# Patient Record
Sex: Female | Born: 1955 | Race: White | Hispanic: No | Smoking: Never smoker
Health system: Southern US, Community
[De-identification: ages and names within clinical notes are randomized; demographics above are authoritative.]

## PROBLEM LIST (undated history)

## (undated) DIAGNOSIS — K5190 Ulcerative colitis, unspecified, without complications: Secondary | ICD-10-CM

## (undated) DIAGNOSIS — E785 Hyperlipidemia, unspecified: Secondary | ICD-10-CM

## (undated) DIAGNOSIS — E119 Type 2 diabetes mellitus without complications: Secondary | ICD-10-CM

## (undated) DIAGNOSIS — R112 Nausea with vomiting, unspecified: Secondary | ICD-10-CM

## (undated) DIAGNOSIS — I10 Essential (primary) hypertension: Secondary | ICD-10-CM

## (undated) DIAGNOSIS — Z9289 Personal history of other medical treatment: Secondary | ICD-10-CM

## (undated) DIAGNOSIS — G2581 Restless legs syndrome: Secondary | ICD-10-CM

## (undated) DIAGNOSIS — G629 Polyneuropathy, unspecified: Secondary | ICD-10-CM

## (undated) DIAGNOSIS — J189 Pneumonia, unspecified organism: Secondary | ICD-10-CM

## (undated) DIAGNOSIS — K5792 Diverticulitis of intestine, part unspecified, without perforation or abscess without bleeding: Secondary | ICD-10-CM

## (undated) DIAGNOSIS — K219 Gastro-esophageal reflux disease without esophagitis: Secondary | ICD-10-CM

## (undated) DIAGNOSIS — G4730 Sleep apnea, unspecified: Secondary | ICD-10-CM

## (undated) DIAGNOSIS — F329 Major depressive disorder, single episode, unspecified: Secondary | ICD-10-CM

## (undated) DIAGNOSIS — J45909 Unspecified asthma, uncomplicated: Secondary | ICD-10-CM

## (undated) DIAGNOSIS — I5032 Chronic diastolic (congestive) heart failure: Secondary | ICD-10-CM

## (undated) DIAGNOSIS — I509 Heart failure, unspecified: Secondary | ICD-10-CM

## (undated) DIAGNOSIS — Z9889 Other specified postprocedural states: Secondary | ICD-10-CM

## (undated) DIAGNOSIS — T7840XA Allergy, unspecified, initial encounter: Secondary | ICD-10-CM

## (undated) DIAGNOSIS — I219 Acute myocardial infarction, unspecified: Secondary | ICD-10-CM

## (undated) DIAGNOSIS — M1990 Unspecified osteoarthritis, unspecified site: Secondary | ICD-10-CM

## (undated) DIAGNOSIS — N189 Chronic kidney disease, unspecified: Secondary | ICD-10-CM

## (undated) DIAGNOSIS — F419 Anxiety disorder, unspecified: Secondary | ICD-10-CM

## (undated) DIAGNOSIS — D649 Anemia, unspecified: Secondary | ICD-10-CM

## (undated) DIAGNOSIS — J449 Chronic obstructive pulmonary disease, unspecified: Secondary | ICD-10-CM

## (undated) DIAGNOSIS — R0902 Hypoxemia: Secondary | ICD-10-CM

## (undated) DIAGNOSIS — R0600 Dyspnea, unspecified: Secondary | ICD-10-CM

## (undated) DIAGNOSIS — F32A Depression, unspecified: Secondary | ICD-10-CM

## (undated) HISTORY — PX: HERNIA REPAIR: SHX51

## (undated) HISTORY — PX: BREAST SURGERY: SHX581

## (undated) HISTORY — DX: Major depressive disorder, single episode, unspecified: F32.9

## (undated) HISTORY — DX: Dyspnea, unspecified: R06.00

## (undated) HISTORY — DX: Restless legs syndrome: G25.81

## (undated) HISTORY — PX: COLON SURGERY: SHX602

## (undated) HISTORY — DX: Acute myocardial infarction, unspecified: I21.9

## (undated) HISTORY — PX: APPENDECTOMY: SHX54

## (undated) HISTORY — PX: TUBAL LIGATION: SHX77

## (undated) HISTORY — DX: Chronic diastolic (congestive) heart failure: I50.32

## (undated) HISTORY — DX: Depression, unspecified: F32.A

## (undated) HISTORY — PX: CARDIAC CATHETERIZATION: SHX172

## (undated) HISTORY — DX: Personal history of other medical treatment: Z92.89

## (undated) HISTORY — DX: Gastro-esophageal reflux disease without esophagitis: K21.9

## (undated) HISTORY — DX: Hypoxemia: R09.02

## (undated) HISTORY — DX: Ulcerative colitis, unspecified, without complications: K51.90

## (undated) HISTORY — PX: CHOLECYSTECTOMY: SHX55

## (undated) HISTORY — DX: Chronic obstructive pulmonary disease, unspecified: J44.9

## (undated) HISTORY — PX: ABDOMINAL HYSTERECTOMY: SHX81

## (undated) HISTORY — DX: Allergy, unspecified, initial encounter: T78.40XA

## (undated) HISTORY — DX: Essential (primary) hypertension: I10

## (undated) HISTORY — DX: Anxiety disorder, unspecified: F41.9

## (undated) HISTORY — DX: Hyperlipidemia, unspecified: E78.5

## (undated) HISTORY — DX: Unspecified asthma, uncomplicated: J45.909

## (undated) HISTORY — PX: BREAST BIOPSY: SHX20

---

## 1999-09-19 ENCOUNTER — Encounter (INDEPENDENT_AMBULATORY_CARE_PROVIDER_SITE_OTHER): Payer: Self-pay

## 1999-09-19 ENCOUNTER — Ambulatory Visit (HOSPITAL_COMMUNITY): Admission: RE | Admit: 1999-09-19 | Discharge: 1999-09-19 | Payer: Self-pay | Admitting: Obstetrics and Gynecology

## 2002-12-14 ENCOUNTER — Ambulatory Visit (HOSPITAL_COMMUNITY): Admission: RE | Admit: 2002-12-14 | Discharge: 2002-12-14 | Payer: Self-pay | Admitting: Obstetrics and Gynecology

## 2002-12-14 ENCOUNTER — Encounter: Payer: Self-pay | Admitting: Obstetrics and Gynecology

## 2003-01-19 ENCOUNTER — Encounter: Admission: RE | Admit: 2003-01-19 | Discharge: 2003-01-19 | Payer: Self-pay | Admitting: Obstetrics and Gynecology

## 2003-01-26 ENCOUNTER — Encounter (INDEPENDENT_AMBULATORY_CARE_PROVIDER_SITE_OTHER): Payer: Self-pay

## 2003-01-27 ENCOUNTER — Inpatient Hospital Stay (HOSPITAL_COMMUNITY): Admission: RE | Admit: 2003-01-27 | Discharge: 2003-01-28 | Payer: Self-pay | Admitting: Obstetrics and Gynecology

## 2004-03-25 ENCOUNTER — Other Ambulatory Visit: Admission: RE | Admit: 2004-03-25 | Discharge: 2004-03-25 | Payer: Self-pay | Admitting: Obstetrics and Gynecology

## 2004-04-02 ENCOUNTER — Ambulatory Visit (HOSPITAL_COMMUNITY): Admission: RE | Admit: 2004-04-02 | Discharge: 2004-04-02 | Payer: Self-pay | Admitting: Obstetrics and Gynecology

## 2004-10-16 ENCOUNTER — Encounter: Admission: RE | Admit: 2004-10-16 | Discharge: 2004-10-16 | Payer: Self-pay | Admitting: Gastroenterology

## 2005-05-01 ENCOUNTER — Other Ambulatory Visit: Admission: RE | Admit: 2005-05-01 | Discharge: 2005-05-01 | Payer: Self-pay | Admitting: Obstetrics and Gynecology

## 2005-05-19 ENCOUNTER — Encounter: Admission: RE | Admit: 2005-05-19 | Discharge: 2005-05-19 | Payer: Self-pay | Admitting: Obstetrics and Gynecology

## 2006-02-24 ENCOUNTER — Encounter (INDEPENDENT_AMBULATORY_CARE_PROVIDER_SITE_OTHER): Payer: Self-pay | Admitting: Specialist

## 2006-02-24 ENCOUNTER — Ambulatory Visit (HOSPITAL_COMMUNITY): Admission: RE | Admit: 2006-02-24 | Discharge: 2006-02-24 | Payer: Self-pay | Admitting: Gastroenterology

## 2008-03-09 HISTORY — PX: SIGMOIDECTOMY: SHX176

## 2008-09-14 ENCOUNTER — Inpatient Hospital Stay (HOSPITAL_COMMUNITY): Admission: RE | Admit: 2008-09-14 | Discharge: 2008-09-17 | Payer: Self-pay | Admitting: Gastroenterology

## 2008-09-15 ENCOUNTER — Ambulatory Visit: Payer: Self-pay | Admitting: Internal Medicine

## 2008-09-19 ENCOUNTER — Inpatient Hospital Stay (HOSPITAL_COMMUNITY): Admission: EM | Admit: 2008-09-19 | Discharge: 2008-09-26 | Payer: Self-pay | Admitting: Emergency Medicine

## 2008-10-29 ENCOUNTER — Encounter: Admission: RE | Admit: 2008-10-29 | Discharge: 2008-10-29 | Payer: Self-pay | Admitting: Gastroenterology

## 2008-11-30 ENCOUNTER — Encounter (INDEPENDENT_AMBULATORY_CARE_PROVIDER_SITE_OTHER): Payer: Self-pay | Admitting: Surgery

## 2008-11-30 ENCOUNTER — Inpatient Hospital Stay (HOSPITAL_COMMUNITY): Admission: AD | Admit: 2008-11-30 | Discharge: 2008-12-06 | Payer: Self-pay | Admitting: Gastroenterology

## 2009-07-12 ENCOUNTER — Encounter: Admission: RE | Admit: 2009-07-12 | Discharge: 2009-07-12 | Payer: Self-pay | Admitting: Surgery

## 2010-06-13 LAB — CBC
HCT: 26 % — ABNORMAL LOW (ref 36.0–46.0)
HCT: 26.7 % — ABNORMAL LOW (ref 36.0–46.0)
HCT: 29.1 % — ABNORMAL LOW (ref 36.0–46.0)
HCT: 32.5 % — ABNORMAL LOW (ref 36.0–46.0)
HCT: 36.1 % (ref 36.0–46.0)
Hemoglobin: 10.7 g/dL — ABNORMAL LOW (ref 12.0–15.0)
Hemoglobin: 11.9 g/dL — ABNORMAL LOW (ref 12.0–15.0)
Hemoglobin: 8.6 g/dL — ABNORMAL LOW (ref 12.0–15.0)
Hemoglobin: 8.8 g/dL — ABNORMAL LOW (ref 12.0–15.0)
Hemoglobin: 9.4 g/dL — ABNORMAL LOW (ref 12.0–15.0)
MCHC: 32.4 g/dL (ref 30.0–36.0)
MCHC: 32.9 g/dL (ref 30.0–36.0)
MCHC: 32.9 g/dL (ref 30.0–36.0)
MCHC: 33 g/dL (ref 30.0–36.0)
MCHC: 33 g/dL (ref 30.0–36.0)
MCV: 82.1 fL (ref 78.0–100.0)
MCV: 82.3 fL (ref 78.0–100.0)
MCV: 83.3 fL (ref 78.0–100.0)
MCV: 83.4 fL (ref 78.0–100.0)
MCV: 83.7 fL (ref 78.0–100.0)
Platelets: 272 10*3/uL (ref 150–400)
Platelets: 287 10*3/uL (ref 150–400)
Platelets: 290 10*3/uL (ref 150–400)
Platelets: 297 10*3/uL (ref 150–400)
Platelets: 324 10*3/uL (ref 150–400)
RBC: 3.12 MIL/uL — ABNORMAL LOW (ref 3.87–5.11)
RBC: 3.24 MIL/uL — ABNORMAL LOW (ref 3.87–5.11)
RBC: 3.48 MIL/uL — ABNORMAL LOW (ref 3.87–5.11)
RBC: 3.9 MIL/uL (ref 3.87–5.11)
RBC: 4.4 MIL/uL (ref 3.87–5.11)
RDW: 20.4 % — ABNORMAL HIGH (ref 11.5–15.5)
RDW: 20.5 % — ABNORMAL HIGH (ref 11.5–15.5)
RDW: 20.6 % — ABNORMAL HIGH (ref 11.5–15.5)
RDW: 20.8 % — ABNORMAL HIGH (ref 11.5–15.5)
RDW: 21.2 % — ABNORMAL HIGH (ref 11.5–15.5)
WBC: 11.2 10*3/uL — ABNORMAL HIGH (ref 4.0–10.5)
WBC: 13.8 10*3/uL — ABNORMAL HIGH (ref 4.0–10.5)
WBC: 14.2 10*3/uL — ABNORMAL HIGH (ref 4.0–10.5)
WBC: 15.8 10*3/uL — ABNORMAL HIGH (ref 4.0–10.5)
WBC: 9.1 10*3/uL (ref 4.0–10.5)

## 2010-06-13 LAB — COMPREHENSIVE METABOLIC PANEL
ALT: 16 U/L (ref 0–35)
AST: 21 U/L (ref 0–37)
Albumin: 2.8 g/dL — ABNORMAL LOW (ref 3.5–5.2)
Alkaline Phosphatase: 83 U/L (ref 39–117)
BUN: 1 mg/dL — ABNORMAL LOW (ref 6–23)
CO2: 19 mEq/L (ref 19–32)
Calcium: 6.6 mg/dL — ABNORMAL LOW (ref 8.4–10.5)
Chloride: 117 mEq/L — ABNORMAL HIGH (ref 96–112)
Creatinine, Ser: 0.67 mg/dL (ref 0.4–1.2)
GFR calc Af Amer: >60 mL/min (ref >60)
GFR calc non Af Amer: >60 mL/min (ref >60)
Glucose, Bld: 115 mg/dL — ABNORMAL HIGH (ref 70–99)
Potassium: 2.5 mEq/L — CL (ref 3.5–5.1)
Sodium: 139 mEq/L (ref 135–145)
Total Bilirubin: 0.4 mg/dL (ref 0.3–1.2)
Total Protein: 5.3 g/dL — ABNORMAL LOW (ref 6.0–8.3)

## 2010-06-13 LAB — BASIC METABOLIC PANEL
BUN: 4 mg/dL — ABNORMAL LOW (ref 6–23)
CO2: 24 mEq/L (ref 19–32)
Calcium: 8.1 mg/dL — ABNORMAL LOW (ref 8.4–10.5)
Chloride: 105 mEq/L (ref 96–112)
Creatinine, Ser: 1.21 mg/dL — ABNORMAL HIGH (ref 0.4–1.2)
GFR calc Af Amer: 56 mL/min — ABNORMAL LOW (ref >60)
GFR calc non Af Amer: 47 mL/min — ABNORMAL LOW (ref >60)
Glucose, Bld: 145 mg/dL — ABNORMAL HIGH (ref 70–99)
Potassium: 4.8 mEq/L (ref 3.5–5.1)
Sodium: 134 mEq/L — ABNORMAL LOW (ref 135–145)

## 2010-06-13 LAB — CREATININE, SERUM
Creatinine, Ser: 0.73 mg/dL (ref 0.4–1.2)
Creatinine, Ser: 0.77 mg/dL (ref 0.4–1.2)
Creatinine, Ser: 1.22 mg/dL — ABNORMAL HIGH (ref 0.4–1.2)
GFR calc Af Amer: 56 mL/min — ABNORMAL LOW (ref >60)
GFR calc Af Amer: >60 mL/min (ref >60)
GFR calc Af Amer: >60 mL/min (ref >60)
GFR calc non Af Amer: 46 mL/min — ABNORMAL LOW (ref >60)
GFR calc non Af Amer: >60 mL/min (ref >60)
GFR calc non Af Amer: >60 mL/min (ref >60)

## 2010-06-13 LAB — PREALBUMIN: Prealbumin: 10.2 mg/dL — ABNORMAL LOW (ref 18.0–45.0)

## 2010-06-13 LAB — ABO/RH: ABO/RH(D): O POS

## 2010-06-13 LAB — POTASSIUM
Potassium: 3.3 mEq/L — ABNORMAL LOW (ref 3.5–5.1)
Potassium: 4 mEq/L (ref 3.5–5.1)
Potassium: 5.1 mEq/L (ref 3.5–5.1)

## 2010-06-13 LAB — ALBUMIN: Albumin: 2.7 g/dL — ABNORMAL LOW (ref 3.5–5.2)

## 2010-06-13 LAB — TYPE AND SCREEN
ABO/RH(D): O POS
Antibody Screen: NEGATIVE

## 2010-06-15 LAB — GLUCOSE, CAPILLARY
Glucose-Capillary: 122 mg/dL — ABNORMAL HIGH (ref 70–99)
Glucose-Capillary: 141 mg/dL — ABNORMAL HIGH (ref 70–99)
Glucose-Capillary: 147 mg/dL — ABNORMAL HIGH (ref 70–99)
Glucose-Capillary: 168 mg/dL — ABNORMAL HIGH (ref 70–99)
Glucose-Capillary: 216 mg/dL — ABNORMAL HIGH (ref 70–99)

## 2010-06-15 LAB — COMPREHENSIVE METABOLIC PANEL
ALT: 12 U/L (ref 0–35)
ALT: 12 U/L (ref 0–35)
ALT: 17 U/L (ref 0–35)
AST: 18 U/L (ref 0–37)
AST: 24 U/L (ref 0–37)
AST: 30 U/L (ref 0–37)
Albumin: 3.2 g/dL — ABNORMAL LOW (ref 3.5–5.2)
Albumin: 3.2 g/dL — ABNORMAL LOW (ref 3.5–5.2)
Albumin: 3.3 g/dL — ABNORMAL LOW (ref 3.5–5.2)
Alkaline Phosphatase: 62 U/L (ref 39–117)
Alkaline Phosphatase: 67 U/L (ref 39–117)
Alkaline Phosphatase: 79 U/L (ref 39–117)
BUN: 3 mg/dL — ABNORMAL LOW (ref 6–23)
BUN: 5 mg/dL — ABNORMAL LOW (ref 6–23)
BUN: 9 mg/dL (ref 6–23)
CO2: 22 mEq/L (ref 19–32)
CO2: 28 mEq/L (ref 19–32)
CO2: 29 mEq/L (ref 19–32)
Calcium: 8.2 mg/dL — ABNORMAL LOW (ref 8.4–10.5)
Calcium: 9.1 mg/dL (ref 8.4–10.5)
Calcium: 9.3 mg/dL (ref 8.4–10.5)
Chloride: 103 mEq/L (ref 96–112)
Chloride: 105 mEq/L (ref 96–112)
Chloride: 106 mEq/L (ref 96–112)
Creatinine, Ser: 0.77 mg/dL (ref 0.4–1.2)
Creatinine, Ser: 0.77 mg/dL (ref 0.4–1.2)
Creatinine, Ser: 0.77 mg/dL (ref 0.4–1.2)
GFR calc Af Amer: >60 mL/min (ref >60)
GFR calc Af Amer: >60 mL/min (ref >60)
GFR calc Af Amer: >60 mL/min (ref >60)
GFR calc non Af Amer: >60 mL/min (ref >60)
GFR calc non Af Amer: >60 mL/min (ref >60)
GFR calc non Af Amer: >60 mL/min (ref >60)
Glucose, Bld: 127 mg/dL — ABNORMAL HIGH (ref 70–99)
Glucose, Bld: 133 mg/dL — ABNORMAL HIGH (ref 70–99)
Glucose, Bld: 176 mg/dL — ABNORMAL HIGH (ref 70–99)
Potassium: 3.3 mEq/L — ABNORMAL LOW (ref 3.5–5.1)
Potassium: 3.6 mEq/L (ref 3.5–5.1)
Potassium: 3.8 mEq/L (ref 3.5–5.1)
Sodium: 137 mEq/L (ref 135–145)
Sodium: 140 mEq/L (ref 135–145)
Sodium: 140 mEq/L (ref 135–145)
Total Bilirubin: 0.2 mg/dL — ABNORMAL LOW (ref 0.3–1.2)
Total Bilirubin: 0.3 mg/dL (ref 0.3–1.2)
Total Bilirubin: 0.6 mg/dL (ref 0.3–1.2)
Total Protein: 6 g/dL (ref 6.0–8.3)
Total Protein: 6.2 g/dL (ref 6.0–8.3)
Total Protein: 6.4 g/dL (ref 6.0–8.3)

## 2010-06-15 LAB — CROSSMATCH
ABO/RH(D): O POS
Antibody Screen: NEGATIVE

## 2010-06-15 LAB — CBC
HCT: 25.3 % — ABNORMAL LOW (ref 36.0–46.0)
HCT: 27.8 % — ABNORMAL LOW (ref 36.0–46.0)
HCT: 27.9 % — ABNORMAL LOW (ref 36.0–46.0)
HCT: 28.7 % — ABNORMAL LOW (ref 36.0–46.0)
HCT: 29.5 % — ABNORMAL LOW (ref 36.0–46.0)
HCT: 30.4 % — ABNORMAL LOW (ref 36.0–46.0)
HCT: 30.8 % — ABNORMAL LOW (ref 36.0–46.0)
HCT: 31.2 % — ABNORMAL LOW (ref 36.0–46.0)
HCT: 31.5 % — ABNORMAL LOW (ref 36.0–46.0)
Hemoglobin: 10.1 g/dL — ABNORMAL LOW (ref 12.0–15.0)
Hemoglobin: 10.2 g/dL — ABNORMAL LOW (ref 12.0–15.0)
Hemoglobin: 10.2 g/dL — ABNORMAL LOW (ref 12.0–15.0)
Hemoglobin: 10.5 g/dL — ABNORMAL LOW (ref 12.0–15.0)
Hemoglobin: 10.7 g/dL — ABNORMAL LOW (ref 12.0–15.0)
Hemoglobin: 8.6 g/dL — ABNORMAL LOW (ref 12.0–15.0)
Hemoglobin: 9.2 g/dL — ABNORMAL LOW (ref 12.0–15.0)
Hemoglobin: 9.4 g/dL — ABNORMAL LOW (ref 12.0–15.0)
Hemoglobin: 9.6 g/dL — ABNORMAL LOW (ref 12.0–15.0)
MCHC: 33.1 g/dL (ref 30.0–36.0)
MCHC: 33.1 g/dL (ref 30.0–36.0)
MCHC: 33.2 g/dL (ref 30.0–36.0)
MCHC: 33.3 g/dL (ref 30.0–36.0)
MCHC: 33.4 g/dL (ref 30.0–36.0)
MCHC: 33.7 g/dL (ref 30.0–36.0)
MCHC: 33.9 g/dL (ref 30.0–36.0)
MCHC: 34.2 g/dL (ref 30.0–36.0)
MCHC: 34.5 g/dL (ref 30.0–36.0)
MCV: 85.3 fL (ref 78.0–100.0)
MCV: 85.4 fL (ref 78.0–100.0)
MCV: 85.7 fL (ref 78.0–100.0)
MCV: 85.9 fL (ref 78.0–100.0)
MCV: 85.9 fL (ref 78.0–100.0)
MCV: 86.4 fL (ref 78.0–100.0)
MCV: 86.5 fL (ref 78.0–100.0)
MCV: 86.6 fL (ref 78.0–100.0)
MCV: 86.7 fL (ref 78.0–100.0)
Platelets: 282 10*3/uL (ref 150–400)
Platelets: 337 10*3/uL (ref 150–400)
Platelets: 344 10*3/uL (ref 150–400)
Platelets: 362 10*3/uL (ref 150–400)
Platelets: 391 10*3/uL (ref 150–400)
Platelets: 392 10*3/uL (ref 150–400)
Platelets: 395 10*3/uL (ref 150–400)
Platelets: 415 10*3/uL — ABNORMAL HIGH (ref 150–400)
Platelets: 420 10*3/uL — ABNORMAL HIGH (ref 150–400)
RBC: 2.95 MIL/uL — ABNORMAL LOW (ref 3.87–5.11)
RBC: 3.21 MIL/uL — ABNORMAL LOW (ref 3.87–5.11)
RBC: 3.22 MIL/uL — ABNORMAL LOW (ref 3.87–5.11)
RBC: 3.34 MIL/uL — ABNORMAL LOW (ref 3.87–5.11)
RBC: 3.44 MIL/uL — ABNORMAL LOW (ref 3.87–5.11)
RBC: 3.51 MIL/uL — ABNORMAL LOW (ref 3.87–5.11)
RBC: 3.55 MIL/uL — ABNORMAL LOW (ref 3.87–5.11)
RBC: 3.66 MIL/uL — ABNORMAL LOW (ref 3.87–5.11)
RBC: 3.69 MIL/uL — ABNORMAL LOW (ref 3.87–5.11)
RDW: 15.3 % (ref 11.5–15.5)
RDW: 15.4 % (ref 11.5–15.5)
RDW: 15.4 % (ref 11.5–15.5)
RDW: 15.5 % (ref 11.5–15.5)
RDW: 15.6 % — ABNORMAL HIGH (ref 11.5–15.5)
RDW: 15.6 % — ABNORMAL HIGH (ref 11.5–15.5)
RDW: 15.8 % — ABNORMAL HIGH (ref 11.5–15.5)
RDW: 15.9 % — ABNORMAL HIGH (ref 11.5–15.5)
RDW: 16.1 % — ABNORMAL HIGH (ref 11.5–15.5)
WBC: 10.2 10*3/uL (ref 4.0–10.5)
WBC: 11 10*3/uL — ABNORMAL HIGH (ref 4.0–10.5)
WBC: 11.2 10*3/uL — ABNORMAL HIGH (ref 4.0–10.5)
WBC: 12.2 10*3/uL — ABNORMAL HIGH (ref 4.0–10.5)
WBC: 12.3 10*3/uL — ABNORMAL HIGH (ref 4.0–10.5)
WBC: 12.6 10*3/uL — ABNORMAL HIGH (ref 4.0–10.5)
WBC: 15.3 10*3/uL — ABNORMAL HIGH (ref 4.0–10.5)
WBC: 8.5 10*3/uL (ref 4.0–10.5)
WBC: 9.1 10*3/uL (ref 4.0–10.5)

## 2010-06-15 LAB — URINALYSIS, ROUTINE W REFLEX MICROSCOPIC
Bilirubin Urine: NEGATIVE
Glucose, UA: NEGATIVE mg/dL
Hgb urine dipstick: NEGATIVE
Ketones, ur: NEGATIVE mg/dL
Nitrite: NEGATIVE
Protein, ur: NEGATIVE mg/dL
Specific Gravity, Urine: 1.008 (ref 1.005–1.030)
Urobilinogen, UA: 0.2 mg/dL (ref 0.0–1.0)
pH: 8.5 — ABNORMAL HIGH (ref 5.0–8.0)

## 2010-06-15 LAB — BASIC METABOLIC PANEL
BUN: 3 mg/dL — ABNORMAL LOW (ref 6–23)
BUN: 4 mg/dL — ABNORMAL LOW (ref 6–23)
BUN: 6 mg/dL (ref 6–23)
BUN: 7 mg/dL (ref 6–23)
CO2: 27 mEq/L (ref 19–32)
CO2: 27 mEq/L (ref 19–32)
CO2: 28 mEq/L (ref 19–32)
CO2: 28 mEq/L (ref 19–32)
Calcium: 8.3 mg/dL — ABNORMAL LOW (ref 8.4–10.5)
Calcium: 8.7 mg/dL (ref 8.4–10.5)
Calcium: 8.9 mg/dL (ref 8.4–10.5)
Calcium: 9 mg/dL (ref 8.4–10.5)
Chloride: 100 mEq/L (ref 96–112)
Chloride: 103 mEq/L (ref 96–112)
Chloride: 105 mEq/L (ref 96–112)
Chloride: 107 mEq/L (ref 96–112)
Creatinine, Ser: 0.72 mg/dL (ref 0.4–1.2)
Creatinine, Ser: 0.74 mg/dL (ref 0.4–1.2)
Creatinine, Ser: 0.78 mg/dL (ref 0.4–1.2)
Creatinine, Ser: 0.78 mg/dL (ref 0.4–1.2)
GFR calc Af Amer: >60 mL/min (ref >60)
GFR calc Af Amer: >60 mL/min (ref >60)
GFR calc Af Amer: >60 mL/min (ref >60)
GFR calc Af Amer: >60 mL/min (ref >60)
GFR calc non Af Amer: >60 mL/min (ref >60)
GFR calc non Af Amer: >60 mL/min (ref >60)
GFR calc non Af Amer: >60 mL/min (ref >60)
GFR calc non Af Amer: >60 mL/min (ref >60)
Glucose, Bld: 109 mg/dL — ABNORMAL HIGH (ref 70–99)
Glucose, Bld: 152 mg/dL — ABNORMAL HIGH (ref 70–99)
Glucose, Bld: 89 mg/dL (ref 70–99)
Glucose, Bld: 99 mg/dL (ref 70–99)
Potassium: 3.1 mEq/L — ABNORMAL LOW (ref 3.5–5.1)
Potassium: 3.1 mEq/L — ABNORMAL LOW (ref 3.5–5.1)
Potassium: 4.1 mEq/L (ref 3.5–5.1)
Potassium: 4.4 mEq/L (ref 3.5–5.1)
Sodium: 134 mEq/L — ABNORMAL LOW (ref 135–145)
Sodium: 138 mEq/L (ref 135–145)
Sodium: 139 mEq/L (ref 135–145)
Sodium: 139 mEq/L (ref 135–145)

## 2010-06-15 LAB — URINE CULTURE
Colony Count: NO GROWTH
Culture: NO GROWTH

## 2010-06-15 LAB — DIFFERENTIAL
Basophils Absolute: 0 10*3/uL (ref 0.0–0.1)
Basophils Absolute: 0 10*3/uL (ref 0.0–0.1)
Basophils Relative: 0 % (ref 0–1)
Basophils Relative: 0 % (ref 0–1)
Eosinophils Absolute: 0 10*3/uL (ref 0.0–0.7)
Eosinophils Absolute: 0.1 10*3/uL (ref 0.0–0.7)
Eosinophils Relative: 0 % (ref 0–5)
Eosinophils Relative: 1 % (ref 0–5)
Lymphocytes Relative: 15 % (ref 12–46)
Lymphocytes Relative: 16 % (ref 12–46)
Lymphs Abs: 2.1 10*3/uL (ref 0.7–4.0)
Lymphs Abs: 2.3 10*3/uL (ref 0.7–4.0)
Monocytes Absolute: 0.9 10*3/uL (ref 0.1–1.0)
Monocytes Absolute: 1.6 10*3/uL — ABNORMAL HIGH (ref 0.1–1.0)
Monocytes Relative: 10 % (ref 3–12)
Monocytes Relative: 8 % (ref 3–12)
Neutro Abs: 11.3 10*3/uL — ABNORMAL HIGH (ref 1.7–7.7)
Neutro Abs: 9.5 10*3/uL — ABNORMAL HIGH (ref 1.7–7.7)
Neutrophils Relative %: 74 % (ref 43–77)
Neutrophils Relative %: 75 % (ref 43–77)

## 2010-06-15 LAB — RENAL FUNCTION PANEL
Albumin: 3.1 g/dL — ABNORMAL LOW (ref 3.5–5.2)
BUN: 7 mg/dL (ref 6–23)
CO2: 26 mEq/L (ref 19–32)
Calcium: 9 mg/dL (ref 8.4–10.5)
Chloride: 109 mEq/L (ref 96–112)
Creatinine, Ser: 0.79 mg/dL (ref 0.4–1.2)
GFR calc Af Amer: >60 mL/min (ref >60)
GFR calc non Af Amer: >60 mL/min (ref >60)
Glucose, Bld: 138 mg/dL — ABNORMAL HIGH (ref 70–99)
Phosphorus: 3.5 mg/dL (ref 2.3–4.6)
Potassium: 4 mEq/L (ref 3.5–5.1)
Sodium: 141 mEq/L (ref 135–145)

## 2010-06-15 LAB — VITAMIN B12: Vitamin B-12: 384 pg/mL (ref 211–911)

## 2010-06-15 LAB — SEDIMENTATION RATE: Sed Rate: 30 mm/hr — ABNORMAL HIGH (ref 0–22)

## 2010-06-15 LAB — MAGNESIUM: Magnesium: 2.2 mg/dL (ref 1.5–2.5)

## 2010-06-15 LAB — POCT PREGNANCY, URINE: Preg Test, Ur: NEGATIVE

## 2010-06-15 LAB — HEMOGLOBIN A1C
Hgb A1c MFr Bld: 6 % (ref 4.6–6.1)
Mean Plasma Glucose: 126 mg/dL

## 2010-06-15 LAB — PROTIME-INR
INR: 0.9 (ref 0.00–1.49)
Prothrombin Time: 12.5 seconds (ref 11.6–15.2)

## 2010-06-15 LAB — LIPASE, BLOOD: Lipase: 13 U/L (ref 11–59)

## 2010-06-15 LAB — PHOSPHORUS: Phosphorus: 2.5 mg/dL (ref 2.3–4.6)

## 2010-06-15 LAB — ABO/RH: ABO/RH(D): O POS

## 2010-06-15 LAB — TSH: TSH: 0.809 u[IU]/mL (ref 0.350–4.500)

## 2010-07-22 NOTE — Discharge Summary (Signed)
Kathleen, Hatfield                   ACCOUNT NO.:  [id number]   MEDICAL RECORD NO.:  [id number]          PATIENT TYPE:  INP   LOCATION:  4142                         FACILITY:  [facility]   PHYSICIAN:  Nelwyn Salisbury, M.D.  DATE OF BIRTH:  06/11/1955   DATE OF ADMISSION:  09/19/2008  DATE OF DISCHARGE:  09/26/2008                               DISCHARGE SUMMARY   ADMITTING DIAGNOSES:  1. Postoperative ileus.  2. Anemia.  3. Ulcerative colitis.  4. Depression.  5. Hypertension.   DISCHARGE DIAGNOSES:  1. Postoperative ileus, resolved.  2. Pan diverticulosis.  3. Anemia of chronic disease.  4. Ulcerative colitis.  5. Hypertension.  6. Depression.   HOSPITAL COURSE:  [name] is a 55 year old white female  with above-mentioned medical problems who was admitted from our  endoscopy unit at [facility] on September 19, 2008, when she  developed severe abdominal pain with abdominal distention after a  flexible sigmoidoscopy.  The plan was to do a colonoscopy after  discharge from September 17, 2008, when the patient had rectal bleeding for a  week and received 3 units of blood during her hospitalization at the  time over the weekend.  However, because patient had a poor prep, the  procedure was aborted within a few minutes of starting the procedure but  the patient developed severe abdominal pain after the procedure with  worsening abdominal distention and therefore was transferred to the  hospital by EMS.  Serial films were done and ileus slowly resolved with  close monitoring of electrolytes and adequate hydration.  Patient was  also started on IV steroids for a possible flare of her ulcerative  colitis and she responded well to that.  Ileus was, however, slow to  resolve and patient was sent home on September 26, 2008, on a bland, low-fat  diet.  She had a CT scan of the abdomen and pelvis during the  hospitalization that showed no evidence of acute abscess formation or  free air, however, there was some thickening of the colon on the left  side, questionable colitis versus diverticulosis.  Patient was also  hypokalemic and received potassium supplements on September 24, 2008.  She  was hyperglycemic and was advised to see her PCP for a hemoglobin A1c to  rule out diabetes mellitus.  Her condition was stabilized and she was  discharged on September 26, 2008, with plans to repeat a colonoscopy at a  later date.  Her admission hemoglobin was 10.7.  Discharge hemoglobin  10.5.  Coags were normal.  Blood glucoses were in the 120 to 150 range  and potassium was low at 3.1 when she received the potassium  supplements.  Serial abdominal films were done to monitor her ileus and  as mentioned above she responded slowly but did well and was discharged  on September 26, 2008.   DISCHARGE MEDICATIONS:  1. Pristiq.  2. Vivelle dot every 96 hours.  3. Protonix 40 mg per day.  4. Asacol 2 pills every 12 hours.  5. Prednisone 40 mg per day for  5 days, 30 mg per day for 5 days, 20      mg per day for 5 days, 10 mg per day for 5 days, and 5 mg per day      for 5 days.  6. She was advised to avoid all nonsteroidals including Advil, Aleve,      ibuprofen, BC's, Goody's.  7. Ultra Flora Ib one per day.  She is to obtain this over the      counter.   Outpatient followup at [facility] with me in 1 week.      Nelwyn Salisbury, M.D.  Electronically Signed     JNM/MEDQ  D:  10/21/2008  T:  10/21/2008  Job:  [id number]   cc:   Ann Held, M.D.

## 2010-07-22 NOTE — Discharge Summary (Signed)
Kathleen Hatfield, Kathleen Hatfield                   ACCOUNT NO.:  [id number]   MEDICAL RECORD NO.:  [id number]          PATIENT TYPE:  INP   LOCATION:  5124                         FACILITY:  [facility]   PHYSICIAN:  Nelwyn Salisbury, M.D.  DATE OF BIRTH:  1955/12/09   DATE OF ADMISSION:  09/14/2008  DATE OF DISCHARGE:  09/17/2008                               DISCHARGE SUMMARY   ADMITTING DIAGNOSES:  1. Rectal bleeding.  2. Ulcerative colitis.  3. Iron-deficiency anemia.  4. Gastroesophageal reflux disease.  5. Pandiverticulosis.  6. Hypertension.  7. Asthma   DISCHARGE DIAGNOSES:  1. Pandiverticulosis.  2. Anemia.  3. Gastroesophageal reflux disease.  4. Ulcerative colitis.  5. Hypertension.  6. Asthma.   HOSPITAL COURSE:  [name] is a pleasant 55 year old white  female who was admitted from the office on September 14, 2008, when she  presented on a Friday with a 1-week history of rectal bleeding.  Patient  claims she has had mild lower abdominal cramping with black stool and  occasional bright red blood.  She denied any history of nonsteroidal  use, recent travel, or sick contacts.  She was initially sent to the  hospital for some IV fluids but subsequently decision was made to admit  her for observation over the weekend.  Patient received 3 units of  packed red blood cells during her hospitalization.  She had no evidence  of bleeding as of Monday morning, September 17, 2008, and was upset about  being uncomfortable in her room and unable to sleep.  Initial hemoglobin  during hospitalization on September 15, 2008, was 8.6 g/dL and up to 10.2  after a couple units of packed red blood cells and down to 9.4 on  discharge.  Patient was also found to have an elevated glucose of 176 on  September 16, 2008, and 138 on September 17, 2008.  Results of labs were  unrevealing except for albumin of 3.1.  TSH was normal.  Plans were to  send the patient home with a scheduled colonoscopy within the week.  Patient was  set up for a flexible sigmoidoscopy by Dr. Delfin Edis but  as she was due for a colonoscopy at this time I cancelled the flexible  sigmoidoscopy with plans for the colonoscopy as mentioned above.   DISCHARGE MEDICATIONS:  1. Protonix 40 mg one per day.  2. Vivelle patch one every 96 hours.  3. Asacol HD 800 mg 2 pills every 8 hours.  4. Effexor 75 mg per day.   Patient has been advised to avoid all nonsteroidals for now.   FOLLOWUP INSTRUCTIONS:  Patient was scheduled for a colonoscopy on September 19, 2008.  If she has any problems in the interim, she is to call the  office immediately for further instructions.      Nelwyn Salisbury, M.D.  Electronically Signed     JNM/MEDQ  D:  10/21/2008  T:  10/21/2008  Job:  [id number]   cc:   Ann Held, M.D.

## 2010-07-25 NOTE — Op Note (Signed)
[facility]  Patient:    [name], Kathleen Hatfield                MRN: [id number] Proc. Date: 09/19/99 Adm. Date:  [id number] Attending:  Delsa Bern Hatfield                           Operative Report  PREOPERATIVE DIAGNOSIS:       Submucosal fibroids.  POSTOPERATIVE DIAGNOSIS:      Submucosal fibroids.  OPERATION:                    Hysteroscopically directed myomectomy by resectoscope.  SURGEON:                      Delsa Bern, M.D.  ASSISTANT:  ANESTHESIA:                   General with endotracheal intubation.  ESTIMATED BLOOD LOSS:         Minimal.  DESCRIPTION OF PROCEDURE:     After being informed of the procedure and possible complications including bleeding, infection, and perforation of the uterus, informed consent was obtained.  The patient was taken to the OR#8 and given general anesthesia with endotracheal intubation.  She was prepped and draped in Hatfield sterile fashion and bladder was emptied with Hatfield Foley catheter.  GYN examination revealed an anteverted uterus normal in size and shape with two normal adnexa.  Weighted speculum was inserted.  Anterior lip of the cervix was grasped with Hatfield tenaculum and the uterus was sounded at 8 cm.  We proceeded with the dilatation of the cervix  with Hegar dilator at dilator #30 and inserted the hysteroscope with resectoscope under direct visualization.  Uterine distention was obtained with distention media at 70 mmHg of pressure.  This allowed [place] to visualize the uterine cavity in its  entirety and to visualize close to the right cornua Hatfield small fibroid of 0.5 cm and closer to the right cornua anterior wall Hatfield larger fibroid which measured about .5 cm.  We proceeded with resection in stripes of the right anterior fibroid which  went without problem and then proceeded with the left anterior myomectomy. This one was Hatfield little more difficult since the volume of the cavity increased as  we ere removing the fibroid.  It was difficult to obtain good distention.  We were able to remove almost entirely both fibroids, maybe leaving behind 10% of the fibroid tissue over the myometrium.  There was no active bleeding by end of the procedure and fluid loss was -270.  We removed all instruments.  Sponge, needle, and instrument counts were correct x 2.  Estimated blood loss was minimal.  The procedure was very well tolerated by the patient who is taken to the recovery room in well and stable condition. DD:  09/19/99 TD:  09/20/99 Job: [id number]

## 2010-07-25 NOTE — H&P (Signed)
NAME:  Kathleen Hatfield, Kathleen Hatfield                        ACCOUNT NO.:  [id number]   MEDICAL RECORD NO.:  [id number]                   PATIENT TYPE:  OBV   LOCATION:  NA                                   FACILITY:  [facility]   PHYSICIAN:  Katharine Look A. Rivard, M.D.              DATE OF BIRTH:  07-07-1955   DATE OF ADMISSION:  01/26/2003  DATE OF DISCHARGE:                                HISTORY & PHYSICAL   REASON FOR ADMISSION:  Uterine fibroids with dysfunctional uterine bleeding  and secondary anemia.   HISTORY OF PRESENT ILLNESS:  This is a 55 year old single white female who  came to me December 06, 2002, complaining of polymenorrhea since January  2004, with increased menstrual flow.  In about 2001 she reports having had a  hysteroscopy, which brought her cycle back to normal every 26 days with a  normal flow.  Since January 2001 the cycle is now short, every 21-24 days,  lasting seven days, with heavy flow and passing clots as large as 6-7 cm  with extreme cramping.  Secondary to this menstrual flow she complains of  increased fatigue with a history of low iron, low hemoglobin.  She has been  on iron supplement for the last three months.   She denies any breakthrough bleeding, dyspareunia, or postcoital bleeding.   REVIEW OF SYSTEMS:  CONSTITUTIONAL:  Chronic fatigue.  HEAD, EYES, EARS,  NOSE, AND THROAT:  Negative.  LUNGS, CARDIOVASCULAR:  Negative.  GASTROINTESTINAL:  Irregular bowel movements with diarrhea alternating with  constipation.  GENITOURINARY:  Negative.  PSYCHIATRIC:  Negative.  NEUROLOGIC:  Negative.   PAST MEDICAL HISTORY:  1. Gravida 2, para 1, with a cesarean x1.  2. Status post bilateral tubal ligation.  3. Mild asthma.  4. Status post hysteroscopy, myomectomy in July 2001.  5. Incisional hernia repair.  6. Hand cyst surgery x3.  7. Appendectomy.  8. Tonsillectomy and adenoidectomy.  9. Nose and ear surgery x2.   CURRENT MEDICATIONS:  1. Wellbutrin XL 150 mg daily  since June 2004.  2. Trazodone h.s. p.r.n.  3. Iron 150 mg b.i.d. since June 2004.  4. Nexium.   Known allergy to [place].   FAMILY HISTORY:  Mother deceased of coronary artery disease.  Father with  high blood pressure.  Maternal aunt with ovarian cancer at age 72.  No  breast cancer.  No colon cancer.   SOCIAL HISTORY:  Single mom.  Lives alone.  Works at [employer].   Current investigation revealed hemoglobin of 10.7 with a normal platelet  count.  Normal TSH.  Low ferritin.  Normal FSH.  Pelvic ultrasound reveals a  5.6 cm anterior pedunculated fibroid with an otherwise normal uterine  evaluation.  Endometrial thickness is 0.6 cm.  Endometrial biopsy revealed  normal benign proliferative endometrium.  Recent mammogram on December 14, 2002, reveals a left breast mass which represents an interval change.  Spot  compression:  Probable benign finding with fibroadenoma, recommending a  short interval follow-up.  The patient was seen on January 23, 2003, by  Sammuel Hines. Marlou Starks, M.D., in consultation for breast mass as well as the recent  episode of rectal bleeding.  Short-interval imaging is recommended for  breast, and evaluation is negative for causes of rectal bleeding at that  evaluation, recommend following if recurrence.   Different modalities for treatment of menorrhagia and uterine fibroids have  been reviewed with the patient, who requests a definitive treatment with  bilateral salpingo-oophorectomy.   PHYSICAL EXAMINATION:  VITAL SIGNS:  Current weight is 207 pounds for a  height of 5 feet 5-1/2 inches.  Blood pressure 120/80.  HEENT:  Negative.  NECK:  Thyroid not enlarged.  CARDIAC:  Regular rate and rhythm.  CHEST:  Clear.  BREASTS:  Normal.  BACK:  No CVA tenderness.  ABDOMEN:  No hepatosplenomegaly, nontender.  Previous Pfannenstiel incision.  EXTREMITIES:  Negative.  NEUROLOGIC:  Within normal limits.  PELVIC:  Normal external genitalia, normal  vagina, normal cervix.  Uterus is  increased at about 14 weeks in size with an anterior fibroid of about 6 cm  but mobile.  Both adnexa are normal.  Rectovaginal exam is negative.   ASSESSMENT:  Uterine fibroids with secondary dysfunctional uterine bleeding  and anemia in a patient requesting a definitive surgical treatment as well  as prophylactic bilateral salpingo-oophorectomy.   PLAN:  She is admitted on January 26, 2003, to undergo a laparoscopically-  assisted vaginal hysterectomy with bilateral salpingo-oophorectomy.  The  procedure has been reviewed with the patient as well as possible  complications, including bleeding, infection, injury to bowel, bladder, and  ureters, need for laparotomy, surgical menopause, as well as estrogen  replacement therapy with its risks and benefits, and Women's Health  Initiative study.  Informed consent was obtained.                                               Dede Query Rivard, M.D.    SAR/MEDQ  D:  01/25/2003  T:  01/26/2003  Job:  [id number]

## 2010-07-25 NOTE — Op Note (Signed)
NAME:  Kathleen Hatfield, Kathleen Hatfield                        ACCOUNT NO.:  [id number]   MEDICAL RECORD NO.:  [id number]                   PATIENT TYPE:  OBV   LOCATION:  9302                                 FACILITY:  [facility]   PHYSICIAN:  Dede Query. Rivard, M.D.              DATE OF BIRTH:  09-06-1955   DATE OF PROCEDURE:  01/26/2003  DATE OF DISCHARGE:                                 OPERATIVE REPORT   PREOPERATIVE DIAGNOSIS:  Uterine fibroids with dysfunctional uterine  bleeding and anemia.   POSTOPERATIVE DIAGNOSES:  1. Uterine fibroids with dysfunctional uterine bleeding and anemia.  2. Probable adenomyosis.  3. Pelvic adhesions.   ANESTHESIA:  General.   PROCEDURE:  Laparoscopically-assisted vaginal hysterectomy with bilateral  salpingo-oophorectomy.   SURGEON:  Dede Query. Rivard, M.D.   ASSISTANTJon Billings. Florene Glen, P.A.   DESCRIPTION OF PROCEDURE:  After being informed of the planned procedure  with possible complications including bleeding, infection, injury to bowels  or bladder with need for laparotomy, surgical menopause, and need for  estrogen replacement therapy with risks and benefits including Women's  Health Initiative study, informed consent was obtained.  The patient was  taken to OR #4, given general anesthesia with endotracheal intubation  without any complication.  She is placed in a lithotomy position, prepped  and draped in a sterile fashion.  A Foley catheter is inserted in her  bladder and an acorn manipulator is inserted in her uterus.   We infiltrate the umbilical area using 8 mL of Marcaine 0.25% and perform a  semi-elliptical incision overlooking the previous scar.  This is bluntly  brought down to the fascia, which is identified, grasped with Allis clamps,  and opened with Mayo scissors.  The peritoneum is then entered bluntly.  There are no adhesions in the area of the incision.  A pursestring suture of  0 Vicryl is placed on the fascia and a Hasson trocar is  inserted and held  with the previously-placed suture.  This allows [place] to produce a  pneumoperitoneum using CO2 at a maximum pressure of 15 mmHg.  The  laparoscope is inserted on the video monitor.  Two 5 mm trocars are placed  on each lower quadrant under direct visualization after infiltration with  Marcaine 0.25% 4 mL.   Observation:  Anterior cul-de-sac shows dense adhesions between the bladder  and the lower uterine segment.  The uterus is bulky, soft, 14 weeks in size,  no fibroid is identified, most likely compatible with adenomyosis.  Both  tubes are identified and have a previous site of tubal ligation.  Both  ovaries are identified.  The right ovary has a corpus luteum cyst of about 2  cm.  The left ovary is held in adhesions to the left pelvic wall.  Posterior  cul-de-sac is partially obliterated by adhesions with the bowel.  Liver edge  is visualized and normal.  Gallbladder is visualized and appears  normal.  Most of the bowel is seen and appears normal.  Appendix is not seen.   A suprapubic 5 mm trocar is inserted under direct visualization to help with  the lysis of adhesions after infiltrating with Marcaine 0.25% 2 mL.  We  start with systematic lysis of adhesions to free the left tube and left  ovary and to free the posterior cul-de-sac to evaluate if it is possible to  proceed with laparoscopically-assisted vaginal hysterectomy.  We are able to  perform this with traction, counter traction, and sharp dissection.  The  right ureter is identified easily.  The left ureter is not identified due to  the adhesion in that area.  Post lysis of adhesions, the peritoneum is  edematous and it is impossible to see the ureter per se.  Both round  ligaments using a tripolar are then cauterized and sectioned, and the broad  ligament is incised with Metzenbaum scissors, which allows [place] to partially  retract bladder downward.  The right infundibulopelvic ligament is then  grasped with a  tripolar, cauterized, and sectioned.  This allows freeing the  whole right cornu of the uterus to the uterine vessels.  On the left side we  are still unable to visualize the ureter, and so we perform a dissection of  the retroperitoneum.  Again, it is impossible to fully identify the ureter  but it is felt secure to cauterize the infundibulopelvic ligament right at  its insertion with the adnexa.  That infundibulopelvic ligament is easily  visualized, and there appears to be no ureter in it.  Using tripolar, it is  cauterized and sectioned.  This again frees completely the left cornu of the  uterus.  We irrigate with warm saline and have a satisfactory hemostasis and  move on to the vaginal time.  Hips are flexed, knees are flexed.  A weighted  speculum is inserted in the vagina.  The acorn manipulator is removed and  two tenaculum forceps are placed on anterior and posterior lip of the  cervix.  We infiltrate the vaginal mucosa in a circumferential manner around  the cervix using lidocaine 1%, epinephrine 1:200,000 15 mL.  With knife we  then incise the vaginal mucosa in a circumferential manner, and we bluntly  dissect the anterior and posterior cul-de-sac, which are bluntly entered  easily.  It then allows [place] to isolate the uterosacral ligament on each side  with a Rogers forceps, section, and suture those ligaments with a transfix  suture of 0 Vicryl for future suspension.  Uterine vessels are then clamped  on each side with Rogers forceps, sectioned, and sutured with 0 Vicryl.  Broad ligament is then clamped on each side with Rogers forceps, sectioned  and sutured with a transfix suture of 0 Vicryl.  Attempt to deliver the  uterus via the posterior cul-de-sac fails due to the volume of the uterus,  and we then have to perform morcellation of the uterus, which is partially transected longitudinally.  This is performed up to about 2 cm from the  fundus.  We can now remove part of the  myometrium internally in a  circumferential manner, and the uterus is delivered entirely with its two  adnexa.   We then proceed with systematic review of all pedicles and hemostasis, and  we complete hemostasis on the left uterosacral ligament using a transfix  suture of 0 Vicryl as well as on the left vascular pedicle with a suture of  0 Vicryl.  On the right side a figure-of-eight stitch of 0 Vicryl is placed  on the uterosacral ligament as well as on the broad ligaments.   Hemostasis is then felt to be adequate.  Using a 0 suture, we perform a  Moschowitz procedure and approximate both uterosacral ligaments with the  posterior cul-de-sac.  We also place a hemostatic running locked suture of 0  Vicryl on the posterior lip of the vaginal apex.  We can now close the  vagina using figure-of-eight stitches of 0 Vicryl in a longitudinal fashion.   A small laceration on the right labia minora is sutured with a simple stitch  of 4-0 Vicryl, and Estrace cream is applied vaginally and on the vulva.  We  then return to the laparoscopic time, place the patient in Trendelenburg,  reinsufflated the pneumoperitoneum, and irrigate profusely with warm saline.  Hemostasis is systematically reviewed on all pedicles.  Both  infundibulopelvic ligaments are seen and hemostatic.  Both ureters are now  well-seen and have normal peristalsis and no dilatation.  We perform  superficial cauterization on the dissection surface of the bladder as well  as on the left uterosacral ligament.  We again irrigate profusely with warm  saline and have a satisfactory hemostasis.  The pneumoperitoneum is reduced  to a minimum to again assess hemostasis, which is adequate.   The three 5 mm trocars are removed under direct visualization.  Hemostasis  is adequate.  We can now remove the Hasson trocar after evacuating the  pneumoperitoneum.  The previously-placed pursestring suture on the fascia of  the umbilical incision is  closed after assessing no bowel loop has protruded  through the incision and tied.  All four incisions are then closed with  subcuticular suture of 4-0 Monocryl and Steri-Strips.   Instrument and sponge count is complete x2.  Estimated blood loss is 150 mL.  The procedure is very well-tolerated by the patient, who is taken to the  recovery room in a well and stable condition.                                               Dede Query Rivard, M.D.    SAR/MEDQ  D:  01/26/2003  T:  01/27/2003  Job:  [id number]

## 2010-07-25 NOTE — Discharge Summary (Signed)
NAME:  Kathleen Hatfield, Kathleen Hatfield                        ACCOUNT NO.:  [id number]   MEDICAL RECORD NO.:  [id number]                   PATIENT TYPE:  INP   LOCATION:  9302                                 FACILITY:  [facility]   PHYSICIAN:  Dede Query. Rivard, M.D.              DATE OF BIRTH:  03-Apr-1955   DATE OF ADMISSION:  01/26/2003  DATE OF DISCHARGE:  01/28/2003                                 DISCHARGE SUMMARY   DISCHARGE DIAGNOSES:  1. Fibroid uterus.  2. Dysfunctional uterine bleeding.  3. Anemia.  4. Pelvic adhesions.  5. Adenomyosis.   OPERATION:  On the day of admission the patient underwent a laparoscopically-  assisted vaginal hysterectomy with bilateral salpingo-oophorectomy and lysis  of adhesions. She tolerated all procedures well. The patient was found to  have an enlarged uterus of approximately 14-weeks size which at time of  operation weighed 287 gm along with normal-appearing tubes, ovaries, and  pelvic adhesions.   HISTORY OF PRESENT ILLNESS:  Kathleen Hatfield is a 55 year old single white  female who presented for hysterectomy because of polymenorrhea and anemia.  Please see patient's dictated history and physical examination for details.   PREOPERATIVE PHYSICAL EXAMINATION:  VITAL SIGNS: Blood pressure 120/80,  weight 270 pounds, height 5 feet 1.5 inches tall.  GENERAL: Within normal limits.  PELVIC EXAM: Normal external genitalia. Normal vagina. Normal cervix. The  uterus increased to about 14-weeks size with anterior fibroid of about 6 cm,  but mobile. Both adnexae were normal. Rectovaginal negative.   HOSPITAL COURSE:  On the day of admission the patient underwent the  aforementioned procedures, tolerating them well. Her postoperative  hemoglobin was 8.9 (preoperative hemoglobin 10.3). The patient's  postoperative course was unremarkable with the exception of a cardiac  arrhythmia which was found on EKG to be occasional premature  supraventricular complexes. A  cardiologist with [facility] was consulted  and advised that due to the patient's being asymptomatic there was no  followup that was needed. By postoperative day #2 the patient had resumed  bowel and bladder function, and was therefore deemed ready for discharge  home.   DISCHARGE MEDICATIONS:  1. Menest 0.3 mg one tablet daily.  2. Motrin 600 mg one tablet q.6h.  3. Tylenol one to two tablets q.4-6h. p.r.n.   FOLLOWUP:  The patient had previously given a six weeks postoperative visit  with Dr. Cletis Media and she is to keep that as scheduled.   DISCHARGE INSTRUCTIONS:  The patient is advised to call for fever greater  than 100.4 degrees Fahrenheit, worsening pain, and heavy bleeding. She was  further advised to not bathe, though she may shower for one week and to not  lift greater than 20 pounds. The patient's diet was without restriction.   FINAL PATHOLOGY:  Uterus, ovaries, and fallopian tubes: Cervical  hyperkeratosis, benign endocervical mucosa with nonspecific epithelial  atypia, no tumor identified; benign proliferative endometrium; adenomyosis;  leiomyomata uteri;  uterine serosal endosalpingiosis; left ovarian follicular  cyst; right ovarian and bilateral fallopian tubes with no pathologic  abnormalities identified.     [place] J. Elizebeth Koller.                    Dede Query Rivard, M.D.    EJP/MEDQ  D:  02/22/2003  T:  02/22/2003  Job:  [id number]

## 2011-05-25 ENCOUNTER — Encounter: Payer: Self-pay | Admitting: Physical Medicine & Rehabilitation

## 2011-05-25 ENCOUNTER — Encounter: Payer: BC Managed Care – PPO | Attending: Physical Medicine & Rehabilitation

## 2011-05-25 ENCOUNTER — Ambulatory Visit (HOSPITAL_BASED_OUTPATIENT_CLINIC_OR_DEPARTMENT_OTHER): Payer: BC Managed Care – PPO | Admitting: Physical Medicine & Rehabilitation

## 2011-05-25 VITALS — BP 147/92 | HR 89 | Resp 16 | Ht 66.0 in | Wt 202.0 lb

## 2011-05-25 DIAGNOSIS — M25569 Pain in unspecified knee: Secondary | ICD-10-CM | POA: Insufficient documentation

## 2011-05-25 DIAGNOSIS — M7918 Myalgia, other site: Secondary | ICD-10-CM

## 2011-05-25 DIAGNOSIS — M542 Cervicalgia: Secondary | ICD-10-CM

## 2011-05-25 DIAGNOSIS — M7650 Patellar tendinitis, unspecified knee: Secondary | ICD-10-CM

## 2011-05-25 DIAGNOSIS — IMO0001 Reserved for inherently not codable concepts without codable children: Secondary | ICD-10-CM

## 2011-05-25 DIAGNOSIS — R52 Pain, unspecified: Secondary | ICD-10-CM | POA: Insufficient documentation

## 2011-05-25 MED ORDER — GABAPENTIN 100 MG PO CAPS: 100.0000 mg | ORAL_CAPSULE | Freq: Three times a day (TID) | ORAL | Status: DC

## 2011-05-25 NOTE — Patient Instructions (Addendum)
Glucosamine tablets and capsules What is this medicine? GLUCOSAMINE (gloo KOH suh meen) is a dietary supplement. It is promoted to keep joints healthy and working smoothly. The FDA has not approved this supplement for any medical use. This supplement may be used for other purposes; ask your health care provider or pharmacist if you have questions. This medicine may be used for other purposes; ask your health care provider or pharmacist if you have questions. What should I tell my health care provider before I take this medicine? They need to know if you have any of these conditions: -diabetes -kidney disease -liver disease -stomach or intestinal problems -an unusual or allergic reaction to glucosamine, other herbs, plants, medicines, foods, dyes, or preservatives -pregnant or trying to get pregnant -breast-feeding How should I use this medicine? Take this medicine by mouth with a glass of water. Follow the directions on the package labeling, or take as directed by your health care professional. Take your doses at regular intervals. If this medicine upsets your stomach, take it with food. Do not take this medicine more often than directed. Contact your pediatrician regarding the use of this medicine in children. Special care may be needed. Overdosage: If you think you have taken too much of this medicine contact a poison control center or emergency room at once. NOTE: This medicine is only for you. Do not share this medicine with others. What if I miss a dose? If you miss a dose, take it as soon as you can. If it is almost time for your next dose, take only that dose. Do not take double or extra doses. What may interact with this medicine? -warfarin This list may not describe all possible interactions. Give your health care provider a list of all the medicines, herbs, non-prescription drugs, or dietary supplements you use. Also tell them if you smoke, drink alcohol, or use illegal drugs. Some  items may interact with your medicine. What should I watch for while using this medicine? See your doctor if your symptoms do not get better or if they get worse. If you are scheduled for any medical or dental procedure, tell your healthcare provider that you are taking this medicine. You may need to stop taking this medicine before the procedure. Herbal or dietary supplements are not regulated like medicines. Rigid quality control standards are not required for dietary supplements. The purity and strength of these products can vary. The safety and effect of this dietary supplement for a certain disease or illness is not well known. This product is not intended to diagnose, treat, cure or prevent any disease. The Food and Drug Administration suggests the following to help consumers protect themselves: -Always read product labels and follow directions. -Natural does not mean a product is safe for humans to take. -Look for products that include USP after the ingredient name. This means that the manufacturer followed the standards of the [place] Pharmacopoeia. -Supplements made or sold by a nationally known food or drug company are more likely to be made under tight controls. You can write to the company for more information about how the product was made. What side effects may I notice from receiving this medicine? Side effects that you should report to your doctor or health care professional as soon as possible: -allergic reactions like skin rash, itching or hives, swelling of the face, lips, or tongue -breathing problems -constipation or diarrhea -loss of appetite -stomach pain Side effects that usually do not require medical attention (report to your  doctor or health care professional if they continue or are bothersome): -gas -headache -nausea -stomach upset This list may not describe all possible side effects. Call your doctor for medical advice about side effects. You may report side effects to  FDA at 1-800-FDA-1088. Where should I keep my medicine? Keep out of the reach of children. Store at room temperature or as directed on the package label. Protect from moisture. Throw away any unused medicine after the expiration date. NOTE: This sheet is a summary. It may not cover all possible information. If you have questions about this medicine, talk to your doctor, pharmacist, or health care provider.  2012, Elsevier/Gold Standard. (07/06/2007 10:56:07 AM)

## 2011-05-25 NOTE — Progress Notes (Signed)
  Subjective:    Patient ID: Kathleen Hatfield, female    DOB: [birth year], 56 y.o.   MRN: [id number]  HPI Chief complaint is right-sided neck pain going on for approximately 10 years. Secondary complaint is knee pain which is worse when she first stands up and then gets better with walking. There complaint is hip pain when she is up walking for longer per time. She's had temporary relief with trochanteric bursa injections. Pain Inventory Average Pain 4 Pain Right Now 7 My pain is constant, dull, stabbing, tingling and aching  In the last 24 hours, has pain interfered with the following? General activity 6 Relation with others 6 Enjoyment of life 8 What TIME of day is your pain at its worst? morning Sleep (in general) Poor  Pain is worse with: walking, sitting, standing and some activites Pain improves with: Nothing Relief from Meds: 0  Mobility walk without assistance how many minutes can you walk? 5-10 ability to climb steps?  yes do you drive?  yes  Function not employed: date last employed 2009 I need assistance with the following:  shopping  Neuro/Psych bladder control problems bowel control problems weakness tremor tingling trouble walking spasms dizziness depression  Prior Studies x-rays  Physicians involved in your care Primary Care Gastroenterologist  Review of Systems  Constitutional: Positive for diaphoresis and unexpected weight change.  HENT: Negative.   Eyes: Negative.   Respiratory: Negative.   Cardiovascular: Negative.   Gastrointestinal: Positive for nausea, abdominal pain, diarrhea and constipation.  Genitourinary: Negative.   Musculoskeletal: Negative.   Skin: Negative.   Neurological: Positive for numbness.  Hematological: Negative.   Psychiatric/Behavioral: Negative.        Objective:   Physical Exam  Constitutional: She appears well-developed.  Neck: Normal range of motion.  Musculoskeletal:       Cervical back: She exhibits  tenderness.       Back:          Assessment & Plan:  1. Neck pain. This is most likely a myofascial pain syndrome. Her neck range of motion is good she has no radicular signs. She may have cervical degenerative disc but will check an x-ray of her neck to see whether disc height looks like and if there is any evidence of spondylosis.  2. Bilateral knee pain symptoms are most suggestive of patellar tendinitis. She has a classic theater sign. She does have a benign knee exam. She does not tolerate nonsteroidal anti-inflammatories and I am recommending glucosamine. We'll check knee x-rays looking for early degenerative changes. She is obese so that she may have collapsed joint spaces. 3. All over body pain. There may be a fibromyalgia syndrome component to this pain versus multi-focal tendinosis and bursitis. I think it's reasonable to give a trial of gabapentin 100 mg 3 times a day working upward to 600 mg as tolerated by symptoms and side effects.

## 2011-06-01 ENCOUNTER — Encounter: Payer: Self-pay | Admitting: *Deleted

## 2011-06-01 NOTE — Progress Notes (Signed)
UDS of 05/25/11 is consistent

## 2011-06-19 ENCOUNTER — Ambulatory Visit: Payer: BC Managed Care – PPO | Admitting: Physical Medicine & Rehabilitation

## 2011-07-27 ENCOUNTER — Other Ambulatory Visit: Payer: Self-pay

## 2011-07-27 MED ORDER — ESTRADIOL 2 MG PO TABS: 2.0000 mg | ORAL_TABLET | Freq: Every day | ORAL | Status: DC

## 2011-07-27 NOTE — Telephone Encounter (Signed)
E2 61m refilled 1 time.  ld

## 2011-07-29 ENCOUNTER — Other Ambulatory Visit: Payer: Self-pay

## 2011-07-29 MED ORDER — ESTRADIOL 2 MG PO TABS: 2.0000 mg | ORAL_TABLET | Freq: Every day | ORAL | Status: DC

## 2011-09-04 ENCOUNTER — Other Ambulatory Visit: Payer: Self-pay | Admitting: Physical Medicine & Rehabilitation

## 2012-06-13 ENCOUNTER — Ambulatory Visit (INDEPENDENT_AMBULATORY_CARE_PROVIDER_SITE_OTHER): Payer: BC Managed Care – PPO | Admitting: Critical Care Medicine

## 2012-06-13 ENCOUNTER — Encounter: Payer: Self-pay | Admitting: Critical Care Medicine

## 2012-06-13 VITALS — BP 120/82 | HR 83 | Temp 98.1°F | Ht 65.25 in | Wt 269.0 lb

## 2012-06-13 DIAGNOSIS — R0600 Dyspnea, unspecified: Secondary | ICD-10-CM | POA: Insufficient documentation

## 2012-06-13 DIAGNOSIS — R0609 Other forms of dyspnea: Secondary | ICD-10-CM

## 2012-06-13 DIAGNOSIS — E785 Hyperlipidemia, unspecified: Secondary | ICD-10-CM | POA: Insufficient documentation

## 2012-06-13 DIAGNOSIS — F329 Major depressive disorder, single episode, unspecified: Secondary | ICD-10-CM | POA: Insufficient documentation

## 2012-06-13 DIAGNOSIS — K5190 Ulcerative colitis, unspecified, without complications: Secondary | ICD-10-CM | POA: Insufficient documentation

## 2012-06-13 DIAGNOSIS — J4530 Mild persistent asthma, uncomplicated: Secondary | ICD-10-CM | POA: Insufficient documentation

## 2012-06-13 DIAGNOSIS — I10 Essential (primary) hypertension: Secondary | ICD-10-CM | POA: Insufficient documentation

## 2012-06-13 DIAGNOSIS — K219 Gastro-esophageal reflux disease without esophagitis: Secondary | ICD-10-CM

## 2012-06-13 DIAGNOSIS — R0989 Other specified symptoms and signs involving the circulatory and respiratory systems: Secondary | ICD-10-CM

## 2012-06-13 DIAGNOSIS — I509 Heart failure, unspecified: Secondary | ICD-10-CM

## 2012-06-13 DIAGNOSIS — F32A Depression, unspecified: Secondary | ICD-10-CM | POA: Insufficient documentation

## 2012-06-13 DIAGNOSIS — J45909 Unspecified asthma, uncomplicated: Secondary | ICD-10-CM

## 2012-06-13 DIAGNOSIS — I5032 Chronic diastolic (congestive) heart failure: Secondary | ICD-10-CM

## 2012-06-13 NOTE — Progress Notes (Signed)
Subjective:    Patient ID: Kathleen Hatfield, female    DOB: [birth year], 57 y.o.   MRN: [id number]  HPI Comments: Hx CHF Diastolic CHF for 3 months ? If other factors Notes DOE and PND  Shortness of Breath This is a chronic problem. The current episode started more than 1 month ago. Episode frequency: worse with exertion but also at rest. The problem has been gradually worsening (worse over 6 months). Associated symptoms include abdominal pain, chest pain, ear pain, leg swelling, orthopnea, PND, a sore throat and wheezing. Pertinent negatives include no claudication, coryza, fever, headaches, hemoptysis, leg pain, neck pain, rash, rhinorrhea, sputum production, swollen glands, syncope or vomiting. The symptoms are aggravated by lying flat, exercise, any activity, fumes, odors and smoke (worse in and out of car, up out of a low chair). Associated symptoms comments: Feels full in abdomen like fluid Notes chest tightness no sharp pain Notes LE edema Notes R sided ear pain and mouth sores Sleeps in recliner Weight is elevated 15#  Dry cough is lay  GERD symptoms daily and meds does not help and will aspirate into the lungs. She has tried beta agonist inhalers (albuterol helps if chest very tight) for the symptoms. The treatment provided mild relief. Her past medical history is significant for allergies, asthma, a heart failure and pneumonia. There is no history of aspirin allergies, bronchiolitis, CAD, chronic lung disease, COPD, DVT, PE or a recent surgery. (Life long asthma: allergic to dried flowers, perfumes bother pt, strong odors; not yet seen cardiology; dx PNA 72yr ago in hospital)   Past Medical History  Diagnosis Date  . Depression   . Hypertension   . Chronic diastolic heart failure   . Dyspnea   . Asthma   . GERD (gastroesophageal reflux disease)   . Hyperlipidemia     cannot tolerate statins  . Ulcerative colitis     dr mCollene Mares    Family History  Problem Relation Age of Onset  .  Heart disease Mother   . Hypertension Mother   . Dementia Mother   . Heart disease Father   . COPD Father   . Hypertension Father      History   Social History  . Marital Status: Single    Spouse Name: N/A    Number of Children: N/A  . Years of Education: N/A   Occupational History  . Not on file.   Social History Main Topics  . Smoking status: Never Smoker   . Smokeless tobacco: Not on file  . Alcohol Use: No  . Drug Use: No  . Sexually Active: Not on file   Other Topics Concern  . Not on file   Social History Narrative  . No narrative on file     Allergies  Allergen Reactions  . Ceclor (Cefaclor)   . Sulfa Antibiotics      Outpatient Prescriptions Prior to Visit  Medication Sig Dispense Refill  . baclofen (LIORESAL) 10 MG tablet Take 10 mg by mouth 3 (three) times daily as needed.       . DULoxetine (CYMBALTA) 60 MG capsule Take 60 mg by mouth daily.      .Marland Kitchenestradiol (ESTRACE) 2 MG tablet Take 1 tablet (2 mg total) by mouth daily.  90 tablet  3  . folic acid (FOLVITE) 1 MG tablet Take 1 mg by mouth 2 (two) times daily.      .Marland Kitchengabapentin (NEURONTIN) 100 MG capsule TAKE 1 CAPSULE (100 MG TOTAL)  BY MOUTH 3 (THREE) TIMES DAILY.  90 capsule  1  . losartan (COZAAR) 100 MG tablet Take 100 mg by mouth daily.      Marland Kitchen rOPINIRole (REQUIP) 4 MG tablet Take 2 mg by mouth at bedtime.      . traMADol (ULTRAM) 50 MG tablet Take 50 mg by mouth every 6 (six) hours as needed.      . cyclobenzaprine (FLEXERIL) 10 MG tablet Take 10 mg by mouth at bedtime as needed.      . fenofibrate 160 MG tablet Take 160 mg by mouth daily.      . hydrochlorothiazide (MICROZIDE) 12.5 MG capsule Take 12.5 mg by mouth daily.      Marland Kitchen tiZANidine (ZANAFLEX) 4 MG tablet Take 4 mg by mouth every 6 (six) hours as needed.       No facility-administered medications prior to visit.       Review of Systems  Constitutional: Positive for chills, diaphoresis, activity change, appetite change, fatigue and  unexpected weight change. Negative for fever.  HENT: Positive for ear pain, sore throat, facial swelling, trouble swallowing and tinnitus. Negative for hearing loss, nosebleeds, congestion, rhinorrhea, sneezing, mouth sores, neck pain, neck stiffness, dental problem, voice change, postnasal drip, sinus pressure and ear discharge.   Eyes: Negative for photophobia, discharge, itching and visual disturbance.  Respiratory: Positive for cough, chest tightness, shortness of breath and wheezing. Negative for apnea, hemoptysis, sputum production, choking and stridor.   Cardiovascular: Positive for chest pain, palpitations, orthopnea, leg swelling and PND. Negative for claudication and syncope.  Gastrointestinal: Positive for nausea, abdominal pain, constipation and abdominal distention. Negative for vomiting and blood in stool.  Genitourinary: Negative for dysuria, urgency, frequency, hematuria, flank pain, decreased urine volume and difficulty urinating.  Musculoskeletal: Positive for myalgias and arthralgias. Negative for back pain, joint swelling and gait problem.  Skin: Positive for color change. Negative for pallor and rash.  Neurological: Positive for light-headedness. Negative for dizziness, tremors, seizures, syncope, speech difficulty, weakness, numbness and headaches.  Hematological: Negative for adenopathy. Does not bruise/bleed easily.  Psychiatric/Behavioral: Negative for confusion, sleep disturbance and agitation. The patient is not nervous/anxious.        Objective:   Physical Exam Filed Vitals:   06/13/12 1135  BP: 120/82  Pulse: 83  Temp: 98.1 F (36.7 C)  TempSrc: Oral  Height: 5' 5.25" (1.657 m)  Weight: 122.018 kg (269 lb)  SpO2: 97%    Gen: Pleasant, obese , in no distress,  normal affect  ENT: No lesions,  mouth clear,  oropharynx clear, no postnasal drip  Neck: No JVD, no TMG, no carotid bruits  Lungs: No use of accessory muscles, no dullness to percussion, clear  without rales or rhonchi, no rales or wheeze  Cardiovascular: RRR, heart sounds normal, no murmur or gallops, 1+  peripheral edema  Abdomen: soft and NT, no HSM,  BS normal  Musculoskeletal: No deformities, no cyanosis or clubbing  Neuro: alert, non focal  Skin: Warm, no lesions or rashes  CXR: pending        Assessment & Plan:   Dyspnea Dyspnea is multifactorial.  Hx of diastolic CHF, no echo avail for review.  Hx of asthma but clear chest on exam.  GERD and upper airway instability, obesity with restriction, all play a role here. Also need to r/o OSA and nocturnal desaturation. Pt has yet to see cardiology Plan Pulmonary function test will be scheduled Labs today Follow reflux diet An appt with cardiology will  be made to include repeat echo An overnight sleep oxygen test will be made Stay on protonix and ranitidine Return 6 weeks    Updated Medication List Outpatient Encounter Prescriptions as of 06/13/2012  Medication Sig Dispense Refill  . acetaminophen (TYLENOL) 500 MG tablet Take 500 mg by mouth every 6 (six) hours as needed for pain.      Marland Kitchen albuterol (PROVENTIL HFA;VENTOLIN HFA) 108 (90 BASE) MCG/ACT inhaler Inhale 2 puffs into the lungs every 6 (six) hours as needed for wheezing.      Marland Kitchen aspirin-acetaminophen-caffeine (EXCEDRIN MIGRAINE) 250-250-65 MG per tablet Take 1 tablet by mouth every 6 (six) hours as needed for pain.      . baclofen (LIORESAL) 10 MG tablet Take 10 mg by mouth 3 (three) times daily as needed.       . benzonatate (TESSALON) 200 MG capsule Take 200 mg by mouth 3 (three) times daily as needed for cough.      . calcium carbonate (TUMS - DOSED IN MG ELEMENTAL CALCIUM) 500 MG chewable tablet Chew 1 tablet by mouth daily.      . Calcium-Vitamin D-Vitamin K (CALCIUM SOFT CHEWS) 500-100-40 MG-UNT-MCG CHEW Chew 1 tablet by mouth daily.      . Cholecalciferol (VITAMIN D-3) 5000 UNITS TABS Take 1 tablet by mouth daily.      . DULoxetine (CYMBALTA) 60 MG  capsule Take 60 mg by mouth daily.      Marland Kitchen estradiol (ESTRACE) 2 MG tablet Take 1 tablet (2 mg total) by mouth daily.  90 tablet  3  . folic acid (FOLVITE) 1 MG tablet Take 1 mg by mouth 2 (two) times daily.      . furosemide (LASIX) 40 MG tablet Take 1 tablet by mouth 2 (two) times daily.       Marland Kitchen gabapentin (NEURONTIN) 100 MG capsule TAKE 1 CAPSULE (100 MG TOTAL) BY MOUTH 3 (THREE) TIMES DAILY.  90 capsule  1  . hydrocortisone 2.5 % cream Apply 1 application topically 2 (two) times daily.      . Linaclotide (LINZESS) 145 MCG CAPS Take 145 mcg by mouth daily.      Marland Kitchen losartan (COZAAR) 100 MG tablet Take 100 mg by mouth daily.      . pantoprazole (PROTONIX) 40 MG tablet Take 1 tablet by mouth 2 (two) times daily.       . pravastatin (PRAVACHOL) 20 MG tablet Take 1 tablet by mouth once a week.      Marland Kitchen rOPINIRole (REQUIP) 4 MG tablet Take 2 mg by mouth at bedtime.      . traMADol (ULTRAM) 50 MG tablet Take 50 mg by mouth every 6 (six) hours as needed.      . vitamin E 400 UNIT capsule Take 400 Units by mouth daily.      . [DISCONTINUED] cyclobenzaprine (FLEXERIL) 10 MG tablet Take 10 mg by mouth at bedtime as needed.      . [DISCONTINUED] fenofibrate 160 MG tablet Take 160 mg by mouth daily.      . [DISCONTINUED] hydrochlorothiazide (MICROZIDE) 12.5 MG capsule Take 12.5 mg by mouth daily.      . [DISCONTINUED] tiZANidine (ZANAFLEX) 4 MG tablet Take 4 mg by mouth every 6 (six) hours as needed.       No facility-administered encounter medications on file as of 06/13/2012.

## 2012-06-13 NOTE — Patient Instructions (Addendum)
Pulmonary function test will be scheduled Labs today Follow reflux diet An appt with cardiology will be made to include repeat echo An overnight sleep oxygen test will be made Stay on protonix and ranitidine Return 6 weeks

## 2012-06-14 ENCOUNTER — Telehealth: Payer: Self-pay | Admitting: Critical Care Medicine

## 2012-06-14 ENCOUNTER — Telehealth: Payer: Self-pay | Admitting: Specialist

## 2012-06-14 LAB — BRAIN NATRIURETIC PEPTIDE: Brain Natriuretic Peptide: 16.6 pg/mL (ref 0.0–100.0)

## 2012-06-14 NOTE — Telephone Encounter (Signed)
I spoke with Kathleen Hatfield earlier and made her aware that Dr Joya Gaskins has not spoken with Dr Burt Knack about this pt.  Kathleen Hatfield will speak with Dr Joya Gaskins and clarify if the pt can see any cardiologist.  I will be out of the office until Friday and Kathleen Hatfield will call our office to arrange office visit if needed.

## 2012-06-14 NOTE — Telephone Encounter (Signed)
New Problem:    Called in because Dr. Joya Gaskins wanted a new patient to have a consult with Dr. Burt Knack on the same day she has her ECHO on 06/17/12.  Please call back.

## 2012-06-14 NOTE — Assessment & Plan Note (Signed)
Dyspnea is multifactorial.  Hx of diastolic CHF, no echo avail for review.  Hx of asthma but clear chest on exam.  GERD and upper airway instability, obesity with restriction, all play a role here. Also need to r/o OSA and nocturnal desaturation. Pt has yet to see cardiology Plan Pulmonary function test will be scheduled Labs today Follow reflux diet An appt with cardiology will be made to include repeat echo An overnight sleep oxygen test will be made Stay on protonix and ranitidine Return 6 weeks

## 2012-06-14 NOTE — Telephone Encounter (Signed)
ERROR

## 2012-06-14 NOTE — Telephone Encounter (Signed)
Left message for [facility] to call back.

## 2012-06-16 ENCOUNTER — Telehealth: Payer: Self-pay | Admitting: Critical Care Medicine

## 2012-06-16 NOTE — Telephone Encounter (Signed)
Spoke to pt she is aware i will have to wait until i hear back form dr Burt Knack and his nurse lauren and then i will call her back Kathleen Hatfield

## 2012-06-17 ENCOUNTER — Ambulatory Visit (INDEPENDENT_AMBULATORY_CARE_PROVIDER_SITE_OTHER): Payer: BC Managed Care – PPO | Admitting: Cardiovascular Disease

## 2012-06-17 ENCOUNTER — Ambulatory Visit (HOSPITAL_COMMUNITY): Payer: BC Managed Care – PPO | Attending: Cardiology | Admitting: Radiology

## 2012-06-17 ENCOUNTER — Encounter: Payer: Self-pay | Admitting: Cardiovascular Disease

## 2012-06-17 ENCOUNTER — Ambulatory Visit: Payer: BC Managed Care – PPO | Admitting: Internal Medicine

## 2012-06-17 VITALS — BP 140/78 | HR 87 | Ht 65.75 in | Wt 265.1 lb

## 2012-06-17 DIAGNOSIS — I5032 Chronic diastolic (congestive) heart failure: Secondary | ICD-10-CM

## 2012-06-17 DIAGNOSIS — R0989 Other specified symptoms and signs involving the circulatory and respiratory systems: Secondary | ICD-10-CM | POA: Insufficient documentation

## 2012-06-17 DIAGNOSIS — R079 Chest pain, unspecified: Secondary | ICD-10-CM

## 2012-06-17 DIAGNOSIS — R0602 Shortness of breath: Secondary | ICD-10-CM

## 2012-06-17 DIAGNOSIS — R0609 Other forms of dyspnea: Secondary | ICD-10-CM | POA: Insufficient documentation

## 2012-06-17 MED ORDER — FUROSEMIDE 40 MG PO TABS: ORAL_TABLET | ORAL | Status: DC

## 2012-06-17 NOTE — Progress Notes (Signed)
HPI:   57 year old woman presenting for initial cardiac evaluation. She's been feeling poorly for the last several months. After Christmas she noted that her weight was up about 20 pounds. She felt like she had a lot of swelling. She's taken diuretics and had some improvement in her edema. However, she complains of marked shortness of breath with activity. This is progressed over about a 6 month time frame. She complains of wheezing and "rattling." She also complains of nocturnal cough nonproductive of sputum. She has orthopnea without PND. She complains of chest tightness with exertion. She does not sleep well and notes difficulty with sleep initiation and also with frequent interruptions. She also complains of cardiac palpitations. She has not had lightheadedness or syncope.  She had an exercise stress test in [place] over a year ago. She's not undergone other formal cardiac testing. She did have an echocardiogram earlier today.  She denies other constitutional symptoms. She specifically denies fevers, chills, or weight loss.  She saw Dr. Joya Gaskins with pulmonary earlier this week. Pulmonary function testing has been arranged. She did have an echocardiogram which you ordered. An overnight sleep study is also being arranged.  Outpatient Encounter Prescriptions as of 06/17/2012  Medication Sig Dispense Refill  . acetaminophen (TYLENOL) 500 MG tablet Take 500 mg by mouth every 6 (six) hours as needed for pain.      Marland Kitchen albuterol (PROVENTIL HFA;VENTOLIN HFA) 108 (90 BASE) MCG/ACT inhaler Inhale 2 puffs into the lungs every 6 (six) hours as needed for wheezing.      Marland Kitchen aspirin-acetaminophen-caffeine (EXCEDRIN MIGRAINE) 250-250-65 MG per tablet Take 1 tablet by mouth every 6 (six) hours as needed for pain.      . baclofen (LIORESAL) 10 MG tablet Take 10 mg by mouth 3 (three) times daily as needed.       . benzonatate (TESSALON) 200 MG capsule Take 200 mg by mouth 3 (three) times daily as needed for cough.       . calcium carbonate (TUMS - DOSED IN MG ELEMENTAL CALCIUM) 500 MG chewable tablet Chew 1 tablet by mouth daily.      . Calcium-Vitamin D-Vitamin K (CALCIUM SOFT CHEWS) 500-100-40 MG-UNT-MCG CHEW Chew 1 tablet by mouth daily.      . Cholecalciferol (VITAMIN D-3) 5000 UNITS TABS Take 1 tablet by mouth daily.      . DULoxetine (CYMBALTA) 60 MG capsule Take 60 mg by mouth daily.      Marland Kitchen estradiol (ESTRACE) 2 MG tablet Take 1 tablet (2 mg total) by mouth daily.  90 tablet  3  . folic acid (FOLVITE) 1 MG tablet Take 1 mg by mouth 2 (two) times daily.      . furosemide (LASIX) 40 MG tablet Take 1 tablet by mouth 2 (two) times daily.       Marland Kitchen gabapentin (NEURONTIN) 100 MG capsule TAKE 1 CAPSULE (100 MG TOTAL) BY MOUTH 3 (THREE) TIMES DAILY.  90 capsule  1  . hydrocortisone 2.5 % cream Apply 1 application topically 2 (two) times daily.      . Linaclotide (LINZESS) 145 MCG CAPS Take 145 mcg by mouth daily.      Marland Kitchen losartan (COZAAR) 100 MG tablet Take 100 mg by mouth daily.      . pantoprazole (PROTONIX) 40 MG tablet Take 1 tablet by mouth 2 (two) times daily.       . pravastatin (PRAVACHOL) 20 MG tablet Take 1 tablet by mouth once a week.      Marland Kitchen  rOPINIRole (REQUIP) 4 MG tablet Take 2 mg by mouth at bedtime.      . traMADol (ULTRAM) 50 MG tablet Take 50 mg by mouth every 6 (six) hours as needed.      . vitamin E 400 UNIT capsule Take 400 Units by mouth daily.       No facility-administered encounter medications on file as of 06/17/2012.    Ceclor and Sulfa antibiotics  Past Medical History  Diagnosis Date  . Depression   . Hypertension   . Chronic diastolic heart failure   . Dyspnea   . Asthma   . GERD (gastroesophageal reflux disease)   . Hyperlipidemia     cannot tolerate statins  . Ulcerative colitis     dr Collene Mares    Past Surgical History  Procedure Laterality Date  . Abdominal hysterectomy    . Cholecystectomy    . Cesarean section    . Appendectomy    . Sigmoidectomy  2010     diverticular disease    History   Social History  . Marital Status: Single    Spouse Name: N/A    Number of Children: N/A  . Years of Education: N/A   Occupational History  . Not on file.   Social History Main Topics  . Smoking status: Never Smoker   . Smokeless tobacco: Not on file  . Alcohol Use: No  . Drug Use: No  . Sexually Active: Not on file   Other Topics Concern  . Not on file   Social History Narrative  . No narrative on file    Family History  Problem Relation Age of Onset  . Heart disease Mother   . Hypertension Mother   . Dementia Mother   . Heart disease Father   . COPD Father   . Hypertension Father    ROS: General: no fevers/chills/night sweats. Positive for fatigue. Eyes: no blurry vision, diplopia, or amaurosis ENT: positive for sore throat,  negative for hearing loss Resp: See history of present illness CV: See history of present illness GI: Positive for constipation, nausea, abdominal pain, and abdominal distention GU: no dysuria, frequency, or hematuria Skin: no rash Neuro: no headache, numbness, tingling, or weakness of extremities Musculoskeletal: Positive for diffuse joint pain Heme: no bleeding, DVT, or easy bruising Endo: no polydipsia or polyuria  BP 140/78  Pulse 87  Ht 5' 5.75" (1.67 m)  Wt 120.258 kg (265 lb 1.9 oz)  BMI 43.12 kg/m2  PHYSICAL EXAM: Pt is alert and oriented, WD, WN, pleasant obese woman in no distress. HEENT: normal Neck: JVP normal. Carotid upstrokes normal without bruits. No thyromegaly. Lungs: equal expansion, clear bilaterally CV: Apex is discrete and nondisplaced, RRR without murmur or gallop Abd: soft, NT, +BS, no bruit, no hepatosplenomegaly Back: no CVA tenderness Ext: no C/C/E        Femoral pulses 2+= without bruits        DP/PT pulses intact and = Skin: warm and dry without rash Neuro: CNII-XII intact             Strength intact = bilaterally  EKG:  Normal sinus rhythm, within normal  limits.  ASSESSMENT AND PLAN: 1. Shortness of breath, symptoms suspicious for diastolic heart failure. She's had mild improvement with diuretics. I personally reviewed her echo images. She has a hyperdynamic left ventricle. Her Doppler parameters are consistent with diastolic dysfunction. I also reviewed her lab work and BNP was normal. However, it is widely reported that BNP can  be falsely low in patients with obesity. She understands the mainstay of treatment is going to involve blood pressure control, diuretics, and most importantly weight loss. We had extensive discussion about the need for diet and exercise with sustained weight loss. I asked her take furosemide 40 mg once daily. She should remain on losartan. I will see her back in one year for followup.  2. Chest pain/pressure. She has a multitude of symptoms. Risk factors for coronary artery disease include hypertension, obesity, and family history of CAD. She also has evidence of diastolic heart failure. Recommend a Lexiscan Myoview stress test. Her shortness of breath will not allow for exercise stress testing.  3. Essential hypertension. Continue losartan. Blood pressure appears controlled.  [name] 06/17/2012 5:37 PM

## 2012-06-17 NOTE — Patient Instructions (Addendum)
Your physician has requested that you have a lexiscan myoview. For further information please visit [email or web address]. Please follow instruction sheet, as given.  Your physician has recommended you make the following change in your medication: DECREASE Furosemide to 55m take one by mouth daily, Please take an extra tablet if your weight increases by 3 lbs in a 24 hour period or more than 5 lbs from baseline  Your physician wants you to follow-up in: 1 YEAR with Dr CBurt Knack  You will receive a reminder letter in the mail two months in advance. If you don't receive a letter, please call our office to schedule the follow-up appointment.

## 2012-06-17 NOTE — Progress Notes (Signed)
Echocardiogram performed.  

## 2012-06-17 NOTE — Telephone Encounter (Signed)
I spoke with the pt and she is the daughter of Dr Cooper's patient.  We will see the pt today after her Echo.

## 2012-06-21 ENCOUNTER — Ambulatory Visit (HOSPITAL_COMMUNITY): Payer: BC Managed Care – PPO | Attending: Cardiology | Admitting: Radiology

## 2012-06-21 VITALS — BP 134/41 | Ht 65.0 in | Wt 264.0 lb

## 2012-06-21 DIAGNOSIS — R079 Chest pain, unspecified: Secondary | ICD-10-CM

## 2012-06-21 DIAGNOSIS — R0989 Other specified symptoms and signs involving the circulatory and respiratory systems: Secondary | ICD-10-CM | POA: Insufficient documentation

## 2012-06-21 DIAGNOSIS — R0609 Other forms of dyspnea: Secondary | ICD-10-CM | POA: Insufficient documentation

## 2012-06-21 DIAGNOSIS — R0602 Shortness of breath: Secondary | ICD-10-CM | POA: Insufficient documentation

## 2012-06-21 DIAGNOSIS — R002 Palpitations: Secondary | ICD-10-CM | POA: Insufficient documentation

## 2012-06-21 DIAGNOSIS — R5383 Other fatigue: Secondary | ICD-10-CM | POA: Insufficient documentation

## 2012-06-21 DIAGNOSIS — Z8249 Family history of ischemic heart disease and other diseases of the circulatory system: Secondary | ICD-10-CM | POA: Insufficient documentation

## 2012-06-21 DIAGNOSIS — E785 Hyperlipidemia, unspecified: Secondary | ICD-10-CM | POA: Insufficient documentation

## 2012-06-21 DIAGNOSIS — R5381 Other malaise: Secondary | ICD-10-CM | POA: Insufficient documentation

## 2012-06-21 DIAGNOSIS — R0789 Other chest pain: Secondary | ICD-10-CM

## 2012-06-21 DIAGNOSIS — I10 Essential (primary) hypertension: Secondary | ICD-10-CM | POA: Insufficient documentation

## 2012-06-21 DIAGNOSIS — J45909 Unspecified asthma, uncomplicated: Secondary | ICD-10-CM | POA: Insufficient documentation

## 2012-06-21 MED ORDER — TECHNETIUM TC 99M SESTAMIBI GENERIC - CARDIOLITE
33.0000 | Freq: Once | INTRAVENOUS | Status: AC | PRN
  Administered 2012-06-21: 33 via INTRAVENOUS

## 2012-06-21 MED ORDER — REGADENOSON 0.4 MG/5ML IV SOLN
0.4000 mg | Freq: Once | INTRAVENOUS | Status: AC
  Administered 2012-06-21: 0.4 mg via INTRAVENOUS

## 2012-06-21 NOTE — Progress Notes (Signed)
[facility] [address] [place], [place] [address] [phone]    Cardiology Nuclear Med Study  Kathleen Hatfield is a 57 y.o. female     MRN : [id number]     DOB: October 10, 1955  Procedure Date: 06/21/2012  Nuclear Med Background Indication for Stress Test:  Evaluation for Ischemia History:  Asthma and GXT , 4/14 ECHO: EF: 60-65%  Cardiac Risk Factors: Family History - CAD, Hypertension and Lipids  Symptoms:  Chest Tightness, DOE, Fatigue, Palpitations and SOB   Nuclear Pre-Procedure Caffeine/Decaff Intake:  None> 12 hrs NPO After: 11:00am   Lungs:  clear O2 Sat: 95% on room air. IV 0.9% NS with Angio Cath:  24g  IV Site: L Antecubital x 1, tolerated well IV Started by:  Irven Baltimore, RN  Chest Size (in):  46 Cup Size: DD  Height: 5' 5"  (1.651 m)  Weight:  264 lb (119.75 kg)  BMI:  Body mass index is 43.93 kg/(m^2). Tech Comments:  Albuterol inhaler at 11:15 am @ home    Nuclear Med Study 1 or 2 day study: 2 day  Stress Test Type:  Lexiscan  Reading MB:TDHRCBU Nahser,M.D. Order Authorizing Provider:  [name], MD  Resting Radionuclide: Technetium 30mSestamibi  Resting Radionuclide Dose: 32.9 mCi on        06-22-12  Stress Radionuclide:  Technetium 979mestamibi  Stress Radionuclide Dose: 33.0 mCi  On         06-21-12          Stress Protocol Rest HR: 85 Stress HR: 95  Rest BP: 134/41 Stress BP: 123/77  Exercise Time (min): n/a METS: n/a   Predicted Max HR: 163 bpm % Max HR: 58.28 bpm Rate Pressure Product: 12730   Dose of Adenosine (mg):  n/a Dose of Lexiscan: 0.4 mg  Dose of Atropine (mg): n/a Dose of Dobutamine: n/a mcg/kg/min (at max HR)  Stress Test Technologist: SaPerrin MalteseEMT-P  Nuclear Technologist:  StCharlton AmorCNMT     Rest Procedure:  Myocardial perfusion imaging was performed at rest 45 minutes following the intravenous administration of Technetium 9947mstamibi. Rest ECG: NSR - Normal EKG  Stress  Procedure:  The patient received IV Lexiscan 0.4 mg over 15-seconds.  Technetium 32m61mtamibi injected at 30-seconds. This patient had sob and nausea with the Lexiscan injection. Quantitative spect images were obtained after a 45 minute delay. Stress ECG: No significant change from baseline ECG  QPS Raw Data Images:  Normal; no motion artifact; normal heart/lung ratio. Stress Images:  Normal homogeneous uptake in all areas of the myocardium. Rest Images:  Normal homogeneous uptake in all areas of the myocardium. Subtraction (SDS):  Normal Transient Ischemic Dilatation (Normal <1.22):  1.22 Lung/Heart Ratio (Normal <0.45):  0.40  Quantitative Gated Spect Images QGS EDV:  74 ml QGS ESV:  13 ml  Impression Exercise Capacity:  Lexiscan with no exercise. BP Response:  Normal blood pressure response. Clinical Symptoms:  No significant symptoms noted. ECG Impression:  No significant ST segment change suggestive of ischemia. Comparison with Prior Nuclear Study: No images to compare  Overall Impression:  Normal stress nuclear study.  No evidence of ischemia.   LV Ejection Fraction: 82%.  LV Wall Motion:  NL LV Function; NL Wall Motion.   PhilThayer Headings.,Brooke BonitoD, FACCElkhart General Hospital6/2014, 4:41 PM Office - [phone] [phone]

## 2012-06-22 ENCOUNTER — Telehealth: Payer: Self-pay | Admitting: Critical Care Medicine

## 2012-06-22 ENCOUNTER — Ambulatory Visit (HOSPITAL_COMMUNITY): Payer: BC Managed Care – PPO | Attending: Cardiology

## 2012-06-22 DIAGNOSIS — R0600 Dyspnea, unspecified: Secondary | ICD-10-CM

## 2012-06-22 DIAGNOSIS — R0902 Hypoxemia: Secondary | ICD-10-CM

## 2012-06-22 DIAGNOSIS — R0989 Other specified symptoms and signs involving the circulatory and respiratory systems: Secondary | ICD-10-CM

## 2012-06-22 MED ORDER — TECHNETIUM TC 99M SESTAMIBI GENERIC - CARDIOLITE
33.0000 | Freq: Once | INTRAVENOUS | Status: AC | PRN
  Administered 2012-06-22: 33 via INTRAVENOUS

## 2012-06-22 NOTE — Telephone Encounter (Signed)
Pt states she is returning PW's call & can be reached at [phone].  Kathleen Hatfield

## 2012-06-22 NOTE — Telephone Encounter (Signed)
Let pt know her ONO on RA is abnormal  She would benefit from oxygen at night also we need to consider sleep apnea in this situation.  I would start with 2 liters oxygen QHS for now No oxygen daytime is needed Use [facility]

## 2012-06-22 NOTE — Telephone Encounter (Signed)
02 ordered through [facility]

## 2012-06-22 NOTE — Telephone Encounter (Signed)
LM with pt's mother to get pt to call us back

## 2012-06-23 ENCOUNTER — Telehealth: Payer: Self-pay | Admitting: Critical Care Medicine

## 2012-06-23 NOTE — Telephone Encounter (Signed)
Called and spoke with pt and she is aware of PW results of ONO---pt wanted to know the numbers she dropped to.  Pt stated that she already has the oxygen and will start on this tonight.  Nothing further is needed.

## 2012-06-23 NOTE — Telephone Encounter (Signed)
Her saturations in low 80s for long periods of time overnight thus would benefit from oxygen 2L QHS

## 2012-06-23 NOTE — Telephone Encounter (Signed)
I spoke with [facility] about this order as it looked to be the wrong DME company used; however [facility] looked into this and patient uses [facility] and order was sent to correct DME and [facility] is setting this up for the patient. Pt is aware of ONO results and will call [facility] to go ahead and setup  O2-pt was called from [facility] prior to knowing the ONO results. Pt had questions about what the numbers (results) were. I do not have ONO in front of me nor has it been scanned in [employer]IC as of today. Pt and Crystal(nurse for PW) are aware that I am placing a telephone message to PW to give number results for patient.

## 2012-07-12 ENCOUNTER — Ambulatory Visit (INDEPENDENT_AMBULATORY_CARE_PROVIDER_SITE_OTHER): Payer: BC Managed Care – PPO | Admitting: Critical Care Medicine

## 2012-07-12 DIAGNOSIS — R0600 Dyspnea, unspecified: Secondary | ICD-10-CM

## 2012-07-12 DIAGNOSIS — J45909 Unspecified asthma, uncomplicated: Secondary | ICD-10-CM

## 2012-07-12 DIAGNOSIS — I5032 Chronic diastolic (congestive) heart failure: Secondary | ICD-10-CM

## 2012-07-12 NOTE — Progress Notes (Signed)
PFT done today. 

## 2012-07-14 ENCOUNTER — Encounter: Payer: Self-pay | Admitting: Critical Care Medicine

## 2012-07-14 ENCOUNTER — Ambulatory Visit (INDEPENDENT_AMBULATORY_CARE_PROVIDER_SITE_OTHER): Payer: BC Managed Care – PPO | Admitting: Critical Care Medicine

## 2012-07-14 VITALS — BP 126/72 | HR 86 | Temp 99.0°F | Ht 64.0 in | Wt 270.0 lb

## 2012-07-14 DIAGNOSIS — J4541 Moderate persistent asthma with (acute) exacerbation: Secondary | ICD-10-CM

## 2012-07-14 DIAGNOSIS — J45901 Unspecified asthma with (acute) exacerbation: Secondary | ICD-10-CM

## 2012-07-14 MED ORDER — PREDNISONE 10 MG PO TABS: ORAL_TABLET | ORAL | Status: DC

## 2012-07-14 MED ORDER — BUDESONIDE-FORMOTEROL FUMARATE 160-4.5 MCG/ACT IN AERO: 2.0000 | INHALATION_SPRAY | Freq: Two times a day (BID) | RESPIRATORY_TRACT | Status: DC

## 2012-07-14 NOTE — Progress Notes (Addendum)
Subjective:    Patient ID: Kathleen Hatfield, female    DOB: December 18, 1955, 57 y.o.   MRN: [id number]  HPI Comments: Hx CHF Diastolic CHF for 3 months ? If other factors Notes DOE and PND  Shortness of Breath This is a chronic problem. The current episode started more than 1 month ago. Episode frequency: worse with exertion but also at rest. The problem has been gradually worsening (worse over 6 months). Associated symptoms include abdominal pain, chest pain, ear pain, leg swelling, orthopnea, PND, a sore throat and wheezing. Pertinent negatives include no claudication, coryza, fever, headaches, hemoptysis, leg pain, neck pain, rash, rhinorrhea, sputum production, swollen glands, syncope or vomiting. The symptoms are aggravated by lying flat, exercise, any activity, fumes, odors and smoke (worse in and out of car, up out of a low chair). Associated symptoms comments: Feels full in abdomen like fluid Notes chest tightness no sharp pain Notes LE edema Notes R sided ear pain and mouth sores Sleeps in recliner Weight is elevated 15#  Dry cough is lay  GERD symptoms daily and meds does not help and will aspirate into the lungs. She has tried beta agonist inhalers (albuterol helps if chest very tight) for the symptoms. The treatment provided mild relief. Her past medical history is significant for allergies, asthma, a heart failure and pneumonia. There is no history of aspirin allergies, bronchiolitis, CAD, chronic lung disease, COPD, DVT, PE or a recent surgery. (Life long asthma: allergic to dried flowers, perfumes bother pt, strong odors; not yet seen cardiology; dx PNA 79yr ago in hospital)   07/14/2012 Pt saw cardiology Now dyspnea is better, only with exertion. Still wheezes Cards agreed has diastolic chf. Lexiscan normal Cough is dry. Pt with ongoing wheezing.  PFTs done and do show obstruction that is reversable    Past Medical History  Diagnosis Date  . Depression   . Hypertension   . Chronic  diastolic heart failure   . Dyspnea   . Asthma   . GERD (gastroesophageal reflux disease)   . Hyperlipidemia     cannot tolerate statins  . Ulcerative colitis     dr mCollene Mares    Family History  Problem Relation Age of Onset  . Heart disease Mother   . Hypertension Mother   . Dementia Mother   . Heart disease Father   . COPD Father   . Hypertension Father      History   Social History  . Marital Status: Single    Spouse Name: N/A    Number of Children: N/A  . Years of Education: N/A   Occupational History  . Not on file.   Social History Main Topics  . Smoking status: Never Smoker   . Smokeless tobacco: Not on file  . Alcohol Use: No  . Drug Use: No  . Sexually Active: Not on file   Other Topics Concern  . Not on file   Social History Narrative  . No narrative on file     Allergies  Allergen Reactions  . Ceclor (Cefaclor)   . Sulfa Antibiotics      Outpatient Prescriptions Prior to Visit  Medication Sig Dispense Refill  . acetaminophen (TYLENOL) 500 MG tablet Take 500 mg by mouth every 6 (six) hours as needed for pain.      .Marland Kitchenalbuterol (PROVENTIL HFA;VENTOLIN HFA) 108 (90 BASE) MCG/ACT inhaler Inhale 2 puffs into the lungs every 6 (six) hours as needed for wheezing.      .Marland Kitchen  aspirin-acetaminophen-caffeine (EXCEDRIN MIGRAINE) 250-250-65 MG per tablet Take 1 tablet by mouth every 6 (six) hours as needed for pain.      . baclofen (LIORESAL) 10 MG tablet Take 10 mg by mouth 3 (three) times daily as needed.       . calcium carbonate (TUMS - DOSED IN MG ELEMENTAL CALCIUM) 500 MG chewable tablet Chew 1 tablet by mouth daily.      . Calcium-Vitamin D-Vitamin K (CALCIUM SOFT CHEWS) 500-100-40 MG-UNT-MCG CHEW Chew 1 tablet by mouth daily.      . Cholecalciferol (VITAMIN D-3) 5000 UNITS TABS Take 1 tablet by mouth daily.      . DULoxetine (CYMBALTA) 60 MG capsule Take 60 mg by mouth daily.      Marland Kitchen estradiol (ESTRACE) 2 MG tablet Take 1 tablet (2 mg total) by mouth daily.   90 tablet  3  . folic acid (FOLVITE) 1 MG tablet Take 1 mg by mouth 2 (two) times daily.      Marland Kitchen gabapentin (NEURONTIN) 100 MG capsule TAKE 1 CAPSULE (100 MG TOTAL) BY MOUTH 3 (THREE) TIMES DAILY.  90 capsule  1  . hydrocortisone 2.5 % cream Apply 1 application topically 2 (two) times daily.      . Linaclotide (LINZESS) 145 MCG CAPS Take 145 mcg by mouth daily.      Marland Kitchen losartan (COZAAR) 100 MG tablet Take 100 mg by mouth daily.      . pantoprazole (PROTONIX) 40 MG tablet Take 1 tablet by mouth 2 (two) times daily.       . pravastatin (PRAVACHOL) 20 MG tablet Take 1 tablet by mouth once a week.      Marland Kitchen rOPINIRole (REQUIP) 4 MG tablet Take 2 mg by mouth at bedtime.      . traMADol (ULTRAM) 50 MG tablet Take 50 mg by mouth every 6 (six) hours as needed.      . vitamin E 400 UNIT capsule Take 400 Units by mouth daily.      . furosemide (LASIX) 40 MG tablet 56m take one by mouth daily, Please take an extra tablet if your weight increases by 3 lbs in a 24 hour period or more than 5 lbs from baseline  45 tablet  11  . benzonatate (TESSALON) 200 MG capsule Take 200 mg by mouth 3 (three) times daily as needed for cough.       No facility-administered medications prior to visit.       Review of Systems  Constitutional: Positive for chills, diaphoresis, activity change, appetite change, fatigue and unexpected weight change. Negative for fever.  HENT: Positive for ear pain, sore throat, facial swelling, trouble swallowing and tinnitus. Negative for hearing loss, nosebleeds, congestion, rhinorrhea, sneezing, mouth sores, neck pain, neck stiffness, dental problem, voice change, postnasal drip, sinus pressure and ear discharge.   Eyes: Negative for photophobia, discharge, itching and visual disturbance.  Respiratory: Positive for cough, chest tightness, shortness of breath and wheezing. Negative for apnea, hemoptysis, sputum production, choking and stridor.   Cardiovascular: Positive for chest pain,  palpitations, orthopnea, leg swelling and PND. Negative for claudication and syncope.  Gastrointestinal: Positive for nausea, abdominal pain, constipation and abdominal distention. Negative for vomiting and blood in stool.  Genitourinary: Negative for dysuria, urgency, frequency, hematuria, flank pain, decreased urine volume and difficulty urinating.  Musculoskeletal: Positive for myalgias and arthralgias. Negative for back pain, joint swelling and gait problem.  Skin: Positive for color change. Negative for pallor and rash.  Neurological: Positive for  light-headedness. Negative for dizziness, tremors, seizures, syncope, speech difficulty, weakness, numbness and headaches.  Hematological: Negative for adenopathy. Does not bruise/bleed easily.  Psychiatric/Behavioral: Negative for confusion, sleep disturbance and agitation. The patient is not nervous/anxious.        Objective:   Physical Exam  Filed Vitals:   07/14/12 1330  BP: 126/72  Pulse: 86  Temp: 99 F (37.2 C)  TempSrc: Oral  Height: 5' 4"  (1.626 m)  Weight: 270 lb (122.471 kg)  SpO2: 95%    Gen: Pleasant, obese , in no distress,  normal affect  ENT: No lesions,  mouth clear,  oropharynx clear, no postnasal drip  Neck: No JVD, no TMG, no carotid bruits  Lungs: No use of accessory muscles, no dullness to percussion, clear without rales or rhonchi, no rales or wheeze  Cardiovascular: RRR, heart sounds normal, no murmur or gallops, 1+  peripheral edema  Abdomen: soft and NT, no HSM,  BS normal  Musculoskeletal: No deformities, no cyanosis or clubbing  Neuro: alert, non focal  Skin: Warm, no lesions or rashes  CXR: NAD  07/2012 PFTs: FeV1 54%  FVC 65% Fev1/fvc 66%  Fef 25 75 43%  TLC 89% DLCO 114%   Raw 268%       Assessment & Plan:   Moderate persistent asthma with exacerbation MOderate persistent asthma with airway obstruction on PFTs seen Plan Start prednisone 86m Take 4 for two days three for two days two  for two days one for two days Start Symbicort two puff twice daily Return 6 weeks      Updated Medication List Outpatient Encounter Prescriptions as of 07/14/2012  Medication Sig Dispense Refill  . acetaminophen (TYLENOL) 500 MG tablet Take 500 mg by mouth every 6 (six) hours as needed for pain.      .Marland Kitchenalbuterol (PROVENTIL HFA;VENTOLIN HFA) 108 (90 BASE) MCG/ACT inhaler Inhale 2 puffs into the lungs every 6 (six) hours as needed for wheezing.      .Marland Kitchenaspirin-acetaminophen-caffeine (EXCEDRIN MIGRAINE) 250-250-65 MG per tablet Take 1 tablet by mouth every 6 (six) hours as needed for pain.      . baclofen (LIORESAL) 10 MG tablet Take 10 mg by mouth 3 (three) times daily as needed.       . calcium carbonate (TUMS - DOSED IN MG ELEMENTAL CALCIUM) 500 MG chewable tablet Chew 1 tablet by mouth daily.      . Calcium-Vitamin D-Vitamin K (CALCIUM SOFT CHEWS) 500-100-40 MG-UNT-MCG CHEW Chew 1 tablet by mouth daily.      . Cholecalciferol (VITAMIN D-3) 5000 UNITS TABS Take 1 tablet by mouth daily.      . DULoxetine (CYMBALTA) 60 MG capsule Take 60 mg by mouth daily.      .Marland Kitchenestradiol (ESTRACE) 2 MG tablet Take 1 tablet (2 mg total) by mouth daily.  90 tablet  3  . folic acid (FOLVITE) 1 MG tablet Take 1 mg by mouth 2 (two) times daily.      . furosemide (LASIX) 40 MG tablet Take 20 mg by mouth 2 (two) times daily.      .Marland Kitchengabapentin (NEURONTIN) 100 MG capsule TAKE 1 CAPSULE (100 MG TOTAL) BY MOUTH 3 (THREE) TIMES DAILY.  90 capsule  1  . hydrocortisone 2.5 % cream Apply 1 application topically 2 (two) times daily.      . Linaclotide (LINZESS) 145 MCG CAPS Take 145 mcg by mouth daily.      .Marland Kitchenlosartan (COZAAR) 100 MG tablet Take 100 mg  by mouth daily.      . pantoprazole (PROTONIX) 40 MG tablet Take 1 tablet by mouth 2 (two) times daily.       . pravastatin (PRAVACHOL) 20 MG tablet Take 1 tablet by mouth once a week.      Marland Kitchen rOPINIRole (REQUIP) 4 MG tablet Take 2 mg by mouth at bedtime.      . traMADol  (ULTRAM) 50 MG tablet Take 50 mg by mouth every 6 (six) hours as needed.      . vitamin E 400 UNIT capsule Take 400 Units by mouth daily.      . [DISCONTINUED] furosemide (LASIX) 40 MG tablet 41m take one by mouth daily, Please take an extra tablet if your weight increases by 3 lbs in a 24 hour period or more than 5 lbs from baseline  45 tablet  11  . benzonatate (TESSALON) 200 MG capsule Take 200 mg by mouth 3 (three) times daily as needed for cough.      . budesonide-formoterol (SYMBICORT) 160-4.5 MCG/ACT inhaler Inhale 2 puffs into the lungs 2 (two) times daily.  1 Inhaler  6  . predniSONE (DELTASONE) 10 MG tablet Take 4 for two days three for two days two for two days one for two days  20 tablet  0   No facility-administered encounter medications on file as of 07/14/2012.

## 2012-07-14 NOTE — Patient Instructions (Addendum)
Start prednisone 69m Take 4 for two days three for two days two for two days one for two days Start Symbicort two puff twice daily Return 6 weeks

## 2012-07-17 NOTE — Assessment & Plan Note (Signed)
MOderate persistent asthma with airway obstruction on PFTs seen Plan Start prednisone 51m Take 4 for two days three for two days two for two days one for two days Start Symbicort two puff twice daily Return 6 weeks

## 2012-07-21 ENCOUNTER — Encounter: Payer: Self-pay | Admitting: Critical Care Medicine

## 2012-08-06 ENCOUNTER — Telehealth: Payer: Self-pay | Admitting: Critical Care Medicine

## 2012-08-06 NOTE — Telephone Encounter (Signed)
Opened in error

## 2012-08-12 ENCOUNTER — Encounter: Payer: Self-pay | Admitting: Critical Care Medicine

## 2012-08-23 ENCOUNTER — Ambulatory Visit (INDEPENDENT_AMBULATORY_CARE_PROVIDER_SITE_OTHER): Payer: BC Managed Care – PPO | Admitting: Critical Care Medicine

## 2012-08-23 ENCOUNTER — Encounter: Payer: Self-pay | Admitting: Critical Care Medicine

## 2012-08-23 VITALS — BP 122/76 | HR 97 | Temp 98.8°F | Ht 65.0 in | Wt 269.4 lb

## 2012-08-23 DIAGNOSIS — R0989 Other specified symptoms and signs involving the circulatory and respiratory systems: Secondary | ICD-10-CM

## 2012-08-23 DIAGNOSIS — Z9989 Dependence on other enabling machines and devices: Secondary | ICD-10-CM | POA: Insufficient documentation

## 2012-08-23 DIAGNOSIS — R0600 Dyspnea, unspecified: Secondary | ICD-10-CM

## 2012-08-23 DIAGNOSIS — J4541 Moderate persistent asthma with (acute) exacerbation: Secondary | ICD-10-CM

## 2012-08-23 DIAGNOSIS — G4710 Hypersomnia, unspecified: Secondary | ICD-10-CM

## 2012-08-23 DIAGNOSIS — G4730 Sleep apnea, unspecified: Secondary | ICD-10-CM

## 2012-08-23 DIAGNOSIS — G4733 Obstructive sleep apnea (adult) (pediatric): Secondary | ICD-10-CM | POA: Insufficient documentation

## 2012-08-23 DIAGNOSIS — J45901 Unspecified asthma with (acute) exacerbation: Secondary | ICD-10-CM

## 2012-08-23 DIAGNOSIS — R0609 Other forms of dyspnea: Secondary | ICD-10-CM

## 2012-08-23 DIAGNOSIS — R0902 Hypoxemia: Secondary | ICD-10-CM

## 2012-08-23 MED ORDER — MOMETASONE FURO-FORMOTEROL FUM 200-5 MCG/ACT IN AERO: 2.0000 | INHALATION_SPRAY | Freq: Two times a day (BID) | RESPIRATORY_TRACT | Status: DC

## 2012-08-23 MED ORDER — PREDNISONE 10 MG PO TABS: ORAL_TABLET | ORAL | Status: DC

## 2012-08-23 NOTE — Patient Instructions (Addendum)
Prednisone 76m Take 4 for four days 3 for four days 2 for four days 1 for four days Stop Symbicort  Start Dulera 200 two puff twice daily No other medications Sleep study Return 1 month

## 2012-08-23 NOTE — Progress Notes (Signed)
Subjective:    Patient ID: Kathleen Hatfield, female    DOB: [birth year], 57 y.o.   MRN: [id number]  HPI Comments: Hx CHF Diastolic CHF for 3 months ? If other factors Notes DOE and PND  07/14/2012 Pt saw cardiology Now dyspnea is better, only with exertion. Still wheezes Cards agreed has diastolic chf. Lexiscan normal Cough is dry. Pt with ongoing wheezing.  PFTs done and do show obstruction that is reversable   08/23/2012 symbicort did not help.  No real improvement  On oxygen d/t low sats at night.  Pt wheezing. Pred last time helped  Past Medical History  Diagnosis Date  . Depression   . Hypertension   . Chronic diastolic heart failure   . Dyspnea   . Asthma   . GERD (gastroesophageal reflux disease)   . Hyperlipidemia     cannot tolerate statins  . Ulcerative colitis     dr Collene Mares     Family History  Problem Relation Age of Onset  . Heart disease Mother   . Hypertension Mother   . Dementia Mother   . Heart disease Father   . COPD Father   . Hypertension Father      History   Social History  . Marital Status: Single    Spouse Name: N/A    Number of Children: N/A  . Years of Education: N/A   Occupational History  . Not on file.   Social History Main Topics  . Smoking status: Never Smoker   . Smokeless tobacco: Not on file  . Alcohol Use: No  . Drug Use: No  . Sexually Active: Not on file   Other Topics Concern  . Not on file   Social History Narrative  . No narrative on file     Allergies  Allergen Reactions  . Ceclor (Cefaclor)   . Sulfa Antibiotics      Outpatient Prescriptions Prior to Visit  Medication Sig Dispense Refill  . acetaminophen (TYLENOL) 500 MG tablet Take 500 mg by mouth every 6 (six) hours as needed for pain.      Marland Kitchen albuterol (PROVENTIL HFA;VENTOLIN HFA) 108 (90 BASE) MCG/ACT inhaler Inhale 2 puffs into the lungs every 6 (six) hours as needed for wheezing.      Marland Kitchen aspirin-acetaminophen-caffeine (EXCEDRIN MIGRAINE) 250-250-65 MG per  tablet Take 1 tablet by mouth every 6 (six) hours as needed for pain.      . baclofen (LIORESAL) 10 MG tablet Take 10 mg by mouth 3 (three) times daily as needed.       . benzonatate (TESSALON) 200 MG capsule Take 200 mg by mouth 3 (three) times daily as needed for cough.      . calcium carbonate (TUMS - DOSED IN MG ELEMENTAL CALCIUM) 500 MG chewable tablet Chew 1 tablet by mouth daily.      . Cholecalciferol (VITAMIN D-3) 5000 UNITS TABS Take 1 tablet by mouth daily.      . DULoxetine (CYMBALTA) 60 MG capsule Take 60 mg by mouth daily.      Marland Kitchen estradiol (ESTRACE) 2 MG tablet Take 1 tablet (2 mg total) by mouth daily.  90 tablet  3  . folic acid (FOLVITE) 1 MG tablet Take 1 mg by mouth 2 (two) times daily.      . furosemide (LASIX) 40 MG tablet Take 20 mg by mouth 2 (two) times daily.      Marland Kitchen gabapentin (NEURONTIN) 100 MG capsule TAKE 1 CAPSULE (100 MG TOTAL) BY MOUTH  3 (THREE) TIMES DAILY.  90 capsule  1  . hydrocortisone 2.5 % cream Apply 1 application topically 2 (two) times daily.      . Linaclotide (LINZESS) 145 MCG CAPS Take 145 mcg by mouth daily.      Marland Kitchen losartan (COZAAR) 100 MG tablet Take 100 mg by mouth daily.      . pantoprazole (PROTONIX) 40 MG tablet Take 1 tablet by mouth 2 (two) times daily.       Marland Kitchen rOPINIRole (REQUIP) 4 MG tablet Take 2 mg by mouth at bedtime.      . traMADol (ULTRAM) 50 MG tablet Take 50 mg by mouth every 6 (six) hours as needed.      . vitamin E 400 UNIT capsule Take 400 Units by mouth daily.      . budesonide-formoterol (SYMBICORT) 160-4.5 MCG/ACT inhaler Inhale 2 puffs into the lungs 2 (two) times daily.  1 Inhaler  6  . Calcium-Vitamin D-Vitamin K (CALCIUM SOFT CHEWS) 500-100-40 MG-UNT-MCG CHEW Chew 1 tablet by mouth daily.      . pravastatin (PRAVACHOL) 20 MG tablet Take 1 tablet by mouth once a week.      . predniSONE (DELTASONE) 10 MG tablet Take 4 for two days three for two days two for two days one for two days  20 tablet  0   No facility-administered  medications prior to visit.       Review of Systems  Constitutional: Positive for chills, diaphoresis, activity change, appetite change, fatigue and unexpected weight change.  HENT: Positive for facial swelling, trouble swallowing and tinnitus. Negative for hearing loss, nosebleeds, congestion, sneezing, mouth sores, neck stiffness, dental problem, voice change, postnasal drip, sinus pressure and ear discharge.   Eyes: Negative for photophobia, discharge, itching and visual disturbance.  Respiratory: Positive for cough and chest tightness. Negative for apnea, choking and stridor.   Cardiovascular: Positive for palpitations.  Gastrointestinal: Positive for nausea, constipation and abdominal distention. Negative for blood in stool.  Genitourinary: Negative for dysuria, urgency, frequency, hematuria, flank pain, decreased urine volume and difficulty urinating.  Musculoskeletal: Positive for myalgias and arthralgias. Negative for back pain, joint swelling and gait problem.  Skin: Positive for color change. Negative for pallor.  Neurological: Positive for light-headedness. Negative for dizziness, tremors, seizures, syncope, speech difficulty, weakness and numbness.  Hematological: Negative for adenopathy. Does not bruise/bleed easily.  Psychiatric/Behavioral: Negative for confusion, sleep disturbance and agitation. The patient is not nervous/anxious.        Objective:   Physical Exam  Filed Vitals:   08/23/12 1427  BP: 122/76  Pulse: 97  Temp: 98.8 F (37.1 C)  TempSrc: Oral  Height: 5' 5"  (1.651 m)  Weight: 269 lb 6.4 oz (122.199 kg)  SpO2: 97%    Gen: Pleasant, obese , in no distress,  normal affect  ENT: No lesions,  mouth clear,  oropharynx clear, no postnasal drip  Neck: No JVD, no TMG, no carotid bruits  Lungs: No use of accessory muscles, no dullness to percussion, clear without rales or rhonchi, expired wheezes noted Cardiovascular: RRR, heart sounds normal, no murmur  or gallops, 1+  peripheral edema  Abdomen: soft and NT, no HSM,  BS normal  Musculoskeletal: No deformities, no cyanosis or clubbing  Neuro: alert, non focal  Skin: Warm, no lesions or rashes  CXR: NAD  07/2012 PFTs: FeV1 54%  FVC 65% Fev1/fvc 66%  Fef 25 75 43%  TLC 89% DLCO 114%   Raw 268%  Echocardiogram showed no  abnormality  Overnight sleep oximetry markedly abnormal with significant desaturation     Assessment & Plan:   Moderate persistent asthma with exacerbation Moderate persistent asthma with ongoing airway inflammation recurrent exacerbation and failure to respond to Symbicort Nocturnal desaturation of oxygen was symptom complex most compatible with obstructive sleep apnea Plan Prednisone 32m Take 4 for four days 3 for four days 2 for four days 1 for four days Stop Symbicort  Start Dulera 200 two puff twice daily No other medications Sleep study Return 1 mont  Hypersomnia with sleep apnea, unspecified History most compatible with obstructive sleep apnea Plan Obtain sleep study    Updated Medication List Outpatient Encounter Prescriptions as of 08/23/2012  Medication Sig Dispense Refill  . acetaminophen (TYLENOL) 500 MG tablet Take 500 mg by mouth every 6 (six) hours as needed for pain.      .Marland Kitchenalbuterol (PROVENTIL HFA;VENTOLIN HFA) 108 (90 BASE) MCG/ACT inhaler Inhale 2 puffs into the lungs every 6 (six) hours as needed for wheezing.      .Marland Kitchenaspirin-acetaminophen-caffeine (EXCEDRIN MIGRAINE) 250-250-65 MG per tablet Take 1 tablet by mouth every 6 (six) hours as needed for pain.      . baclofen (LIORESAL) 10 MG tablet Take 10 mg by mouth 3 (three) times daily as needed.       . benzonatate (TESSALON) 200 MG capsule Take 200 mg by mouth 3 (three) times daily as needed for cough.      . calcium carbonate (TUMS - DOSED IN MG ELEMENTAL CALCIUM) 500 MG chewable tablet Chew 1 tablet by mouth daily.      . Cholecalciferol (VITAMIN D-3) 5000 UNITS TABS Take 1 tablet by  mouth daily.      . DULoxetine (CYMBALTA) 60 MG capsule Take 60 mg by mouth daily.      .Marland Kitchenestradiol (ESTRACE) 2 MG tablet Take 1 tablet (2 mg total) by mouth daily.  90 tablet  3  . folic acid (FOLVITE) 1 MG tablet Take 1 mg by mouth 2 (two) times daily.      . furosemide (LASIX) 40 MG tablet Take 20 mg by mouth 2 (two) times daily.      .Marland Kitchengabapentin (NEURONTIN) 100 MG capsule TAKE 1 CAPSULE (100 MG TOTAL) BY MOUTH 3 (THREE) TIMES DAILY.  90 capsule  1  . hydrocortisone 2.5 % cream Apply 1 application topically 2 (two) times daily.      . Linaclotide (LINZESS) 145 MCG CAPS Take 145 mcg by mouth daily.      .Marland Kitchenlosartan (COZAAR) 100 MG tablet Take 100 mg by mouth daily.      . pantoprazole (PROTONIX) 40 MG tablet Take 1 tablet by mouth 2 (two) times daily.       .Marland KitchenrOPINIRole (REQUIP) 4 MG tablet Take 2 mg by mouth at bedtime.      . traMADol (ULTRAM) 50 MG tablet Take 50 mg by mouth every 6 (six) hours as needed.      . vitamin E 400 UNIT capsule Take 400 Units by mouth daily.      . [DISCONTINUED] budesonide-formoterol (SYMBICORT) 160-4.5 MCG/ACT inhaler Inhale 2 puffs into the lungs 2 (two) times daily.  1 Inhaler  6  . Calcium-Vitamin D-Vitamin K (CALCIUM SOFT CHEWS) 500-100-40 MG-UNT-MCG CHEW Chew 1 tablet by mouth daily.      . mometasone-formoterol (DULERA) 200-5 MCG/ACT AERO Inhale 2 puffs into the lungs 2 (two) times daily.  1 Inhaler  6  . pravastatin (PRAVACHOL) 20 MG tablet  Take 1 tablet by mouth once a week.      . predniSONE (DELTASONE) 10 MG tablet Take 4 for four days 3 for four days 2 for four days 1 for four days  40 tablet  0  . [DISCONTINUED] predniSONE (DELTASONE) 10 MG tablet Take 4 for two days three for two days two for two days one for two days  20 tablet  0   No facility-administered encounter medications on file as of 08/23/2012.

## 2012-08-23 NOTE — Assessment & Plan Note (Signed)
Moderate persistent asthma with ongoing airway inflammation recurrent exacerbation and failure to respond to Symbicort Nocturnal desaturation of oxygen was symptom complex most compatible with obstructive sleep apnea Plan Prednisone 23m Take 4 for four days 3 for four days 2 for four days 1 for four days Stop Symbicort  Start Dulera 200 two puff twice daily No other medications Sleep study Return 1 mont

## 2012-08-23 NOTE — Assessment & Plan Note (Signed)
History most compatible with obstructive sleep apnea Plan Obtain sleep study

## 2012-08-25 ENCOUNTER — Ambulatory Visit (HOSPITAL_BASED_OUTPATIENT_CLINIC_OR_DEPARTMENT_OTHER): Payer: BC Managed Care – PPO | Attending: Critical Care Medicine

## 2012-08-25 DIAGNOSIS — K219 Gastro-esophageal reflux disease without esophagitis: Secondary | ICD-10-CM | POA: Insufficient documentation

## 2012-08-25 DIAGNOSIS — G4710 Hypersomnia, unspecified: Secondary | ICD-10-CM

## 2012-08-25 DIAGNOSIS — I5032 Chronic diastolic (congestive) heart failure: Secondary | ICD-10-CM | POA: Insufficient documentation

## 2012-08-25 DIAGNOSIS — R0902 Hypoxemia: Secondary | ICD-10-CM

## 2012-08-25 DIAGNOSIS — G4733 Obstructive sleep apnea (adult) (pediatric): Secondary | ICD-10-CM | POA: Insufficient documentation

## 2012-08-31 DIAGNOSIS — G4700 Insomnia, unspecified: Secondary | ICD-10-CM

## 2012-08-31 DIAGNOSIS — G4733 Obstructive sleep apnea (adult) (pediatric): Secondary | ICD-10-CM

## 2012-08-31 NOTE — Procedures (Signed)
NAMETONEA, LEIPHART NO.:  [id number]  MEDICAL RECORD NO.:  [id number]          PATIENT TYPE:  OUT  LOCATION:  SLEEP CENTER                 FACILITY:  [facility]  PHYSICIAN:  Rigoberto Noel, MD      DATE OF BIRTH:  28-Nov-1955  DATE OF STUDY:  08/25/2012                           NOCTURNAL POLYSOMNOGRAM  REFERRING PHYSICIAN:  Burnett Harry. Joya Gaskins, MD, FCCP  INDICATION FOR STUDY:  Ms. Koehler is a 57 year old woman with chronic diastolic heart failure and ulcerative colitis.  Her main sleep problem is that she cannot fall asleep.  She has reflux and body aches and restless legs syndrome.  At the time of this study, she weighed 269 pounds with a height of 5 feet 5 inches, and a BMI of 45, neck circumference is 16 inches.  EPWORTH SLEEPINESS SCORE:  13.  This nocturnal polysomnogram was performed with a sleep technologist in attendance.  EEG, EOG, EMG, EKG, and respiratory parameters were recorded.  Sleep stages, arousals, limb movements, and respiratory data were scored according to criteria laid out by the American Academy of Sleep Medicine.   SLEEP ARCHITECTURE:  Lights out was at 10:59 p.m., lights on was at 5:03 a.m., total sleep time was 143 minutes with a sleep period time of 217 minutes and a sleep efficiency of 40%.  Sleep stages as the percentage of total sleep time was N1 5%, N2 94%, N3 0%, REM sleep 0%.  Awake after sleep onset was 81 minutes.  Supine sleep accounted for the entire duration.  Arousal Data:  There were 73 arousals with an arousal index of 31 events per hour, almost all of these were spontaneous.  RESPIRATORY DATA:  There were 13 obstructive apneas, 0 central apneas, 0 mixed apneas, and 73 hypopneas with an apnea-hypopnea index of 36 events per hour.  The longest apnea was 17 seconds and the longest hypopnea was 22 seconds.  OXYGEN DATA:  The desaturation index of 54 events per hour.  The lowest desaturation was 82% and she spent 7 minutes  with a saturation less than 88%.  CARDIAC DATA:  The low heart rate was 56 beats per minute.  The high heart rate was 121 beats per minute.  No arrhythmias were noted.  MOVEMENT-PARASOMNIA:  Few limb movements were noted with a PLM index of 2.5 events per hour.  These were not associated with arousals.  Discussion:  She was desensitized with a small nasal mask.  Sleep onset insomnia was noted, which may be a first night effect.  Latency to sleep was 140 minutes.  IMPRESSIONS-RECOMMENDATIONS: 1. Moderate obstructive sleep apnea with predominant hypopneas causing     sleep fragmentation and moderate oxygen desaturation. 2. No evidence of cardiac arrhythmias, limb movements, or behavioral     disturbance during sleep. 3. Sleep onset insomnia.  Recommend: 1. Treatment options for this degree of sleep-disordered breathing     include weight loss and CPAP therapy or dental appliance. 2. Behavioral therapy for sleep onset insomnia can be discussed. 3. She should be asked to avoid medications with sedative side     effects.  She should be cautioned against driving when  sleepy.     Rigoberto Noel, MD    RVA/MEDQ  D:  08/31/2012 09:37:35  T:  08/31/2012 09:53:07  Job:  [id number]

## 2012-09-06 ENCOUNTER — Telehealth: Payer: Self-pay | Admitting: Critical Care Medicine

## 2012-09-06 DIAGNOSIS — G4710 Hypersomnia, unspecified: Secondary | ICD-10-CM

## 2012-09-06 NOTE — Telephone Encounter (Signed)
Pt made aware of abn sleep study  I ordered cpap via [facility]

## 2012-09-06 NOTE — Telephone Encounter (Signed)
Order faxed to [place]

## 2012-09-07 ENCOUNTER — Telehealth: Payer: Self-pay | Admitting: Pulmonary Disease

## 2012-09-07 NOTE — Telephone Encounter (Signed)
I spoke with pt. She has 2 questions. 1) Does she still need to use her O2 at night since she will be set up on CPAP? 2) does she need to schedule an appt? Or does she need to see a sleep specialists?   Please advise Dr. Joya Gaskins thanks

## 2012-09-07 NOTE — Telephone Encounter (Signed)
It would be a good idea to refer to dr Elsworth Soho for sleep med f/u  Yes ok to stop oxygen once on cpap , plan would be to repeat study in the home with ONO on cpap without oxygen

## 2012-09-07 NOTE — Telephone Encounter (Signed)
Spoke with pt and notified of recs per PW She verbalized understanding  Sleep consult scheduled for 10/21/12 at 2:15 pm- aware to arrive at 2 pm

## 2012-09-08 ENCOUNTER — Telehealth: Payer: Self-pay | Admitting: Critical Care Medicine

## 2012-09-08 NOTE — Telephone Encounter (Signed)
Spoke with patient, Patient requesting a copy of her sleep study (dictated) be mailed to her home. This has been placed in envelope and placed upfront for outgoing mail.  Patient also requesting sleep results be faxed to Dr. Bary Leriche office as well as pft results This has been faxed to: Attn Dr. Herbie Baltimore Scott--[phone] Nothing further needed at this time

## 2012-10-21 ENCOUNTER — Ambulatory Visit (INDEPENDENT_AMBULATORY_CARE_PROVIDER_SITE_OTHER): Payer: BC Managed Care – PPO | Admitting: Pulmonary Disease

## 2012-10-21 ENCOUNTER — Encounter: Payer: Self-pay | Admitting: Pulmonary Disease

## 2012-10-21 VITALS — BP 118/70 | HR 93 | Temp 98.0°F | Ht 65.0 in | Wt 272.4 lb

## 2012-10-21 DIAGNOSIS — R0609 Other forms of dyspnea: Secondary | ICD-10-CM

## 2012-10-21 DIAGNOSIS — G4733 Obstructive sleep apnea (adult) (pediatric): Secondary | ICD-10-CM

## 2012-10-21 DIAGNOSIS — R0600 Dyspnea, unspecified: Secondary | ICD-10-CM

## 2012-10-21 DIAGNOSIS — R0989 Other specified symptoms and signs involving the circulatory and respiratory systems: Secondary | ICD-10-CM

## 2012-10-21 NOTE — Assessment & Plan Note (Addendum)
We will change pressure to 13 cm & check download in 1 month Mask adjustment   Weight loss encouraged, compliance with goal of at least 4-6 hrs every night is the expectation. Advised against medications with sedative side effects Cautioned against driving when sleepy - understanding that sleepiness will vary on a day to day basis

## 2012-10-21 NOTE — Progress Notes (Signed)
Subjective:    Patient ID: Kathleen Hatfield, female    DOB: 1955/12/23, 57 y.o.   MRN: [id number]  HPI  57 year old woman results for evaluation of obstructive sleep apnea and insomnia.  PMh-  chronic diastolic heart failure, severe anxiety and ulcerative colitis. Her main sleep problem is that she cannot fall asleep. She has reflux and body aches and restless legs syndrome. At the time of this study, she weighed 269 pounds with a height of 5 feet 5 inches, and a BMI of 45, neck circumference is 16 inches.   Overnight polysomnogram 08/2012 showed severe obstructive sleep apnea with an apnea-hypopnea index of 36 events with predom hypopneas. Latency to sleep was 140 minutes. PLM index was 2.5 events per hour on requip 35m - now increased to twice daily.   Epworth sleepiness score is 14/24. She only sleeps one to 2 hours at a time and most he sleeps in a recliner. She often wakes up in a panic attack. Bedtime is around midnight and sometimes up to 3 AM, sleep latency is variable from 30 minutes to an hour. She is 4-5 nocturnal awakenings and is out of bed at 9 to 11 AM feeling tired with occasional dryness of mouth. There is no history suggestive of cataplexy, sleep paralysis or parasomnias   Pt had sleep study 08/25/12. Pt has been set up on CPAP x 1 month ago. Pt feels like her pressure needs to be changed. she is currently on auto mode and notices when she has apnea the pressure will blast. He face mask needs to be changed feels she needs a smaller mask.   She was tearful during the interview and admitted to severe anxiety. She had a prior bad experience with a counselor from her church.  She does her CPAP now but compliance seems to be only 3.5 hours average on auto settings. Download over 2 weeks shows average pressure 13 cm with no residual events and no leak.   Past Medical History  Diagnosis Date  . Depression   . Hypertension   . Chronic diastolic heart failure   . Dyspnea   . Asthma   .  GERD (gastroesophageal reflux disease)   . Hyperlipidemia     cannot tolerate statins  . Ulcerative colitis     dr mCollene Mares  Past Surgical History  Procedure Laterality Date  . Abdominal hysterectomy    . Cholecystectomy    . Cesarean section    . Appendectomy    . Sigmoidectomy  2010    diverticular disease    Allergies  Allergen Reactions  . Ceclor [Cefaclor]   . Sulfa Antibiotics     History   Social History  . Marital Status: Single    Spouse Name: N/A    Number of Children: N/A  . Years of Education: N/A   Occupational History  . unemployed    Social History Main Topics  . Smoking status: Never Smoker   . Smokeless tobacco: Not on file  . Alcohol Use: No  . Drug Use: No  . Sexual Activity: Not on file   Other Topics Concern  . Not on file   Social History Narrative  . No narrative on file    Family History  Problem Relation Age of Onset  . Heart disease Mother   . Hypertension Mother   . Dementia Mother   . Heart disease Father   . COPD Father   . Hypertension Father      Review  of Systems  Constitutional: Negative for fever and unexpected weight change.  HENT: Positive for ear pain, rhinorrhea, postnasal drip and sinus pressure. Negative for nosebleeds, congestion, sore throat, sneezing, trouble swallowing and dental problem.   Eyes: Negative for redness and itching.  Respiratory: Positive for chest tightness, shortness of breath and wheezing. Negative for cough.   Cardiovascular: Positive for palpitations and leg swelling.  Gastrointestinal: Positive for nausea. Negative for vomiting.  Genitourinary: Negative for dysuria.  Musculoskeletal: Negative for joint swelling.  Skin: Negative for rash.  Neurological: Positive for headaches.  Hematological: Does not bruise/bleed easily.  Psychiatric/Behavioral: Negative for dysphoric mood. The patient is not nervous/anxious.        Objective:   Physical Exam  Gen. Pleasant, obese, in no distress,  normal affect ENT - no lesions, no post nasal drip, class 2-3 airway Neck: No JVD, no thyromegaly, no carotid bruits Lungs: no use of accessory muscles, no dullness to percussion, decreased without rales or rhonchi  Cardiovascular: Rhythm regular, heart sounds  normal, no murmurs or gallops, no peripheral edema Abdomen: soft and non-tender, no hepatosplenomegaly, BS normal. Musculoskeletal: No deformities, no cyanosis or clubbing Neuro:  alert, non focal, no tremors        Assessment & Plan:

## 2012-10-21 NOTE — Patient Instructions (Addendum)
We will change pressure to 13 cm & check download in 1 month Mask adjustment Usage of at least 4 h every night is recommended Referral to counselor for anxiety/ dperession

## 2012-10-21 NOTE — Assessment & Plan Note (Signed)
She has severe anxiety and was tearful during today's interview. I think her dyspnea may be attributable to generalized anxiety disorder. Referral to counselor for anxiety/ depression

## 2012-10-24 ENCOUNTER — Telehealth: Payer: Self-pay | Admitting: Pulmonary Disease

## 2012-10-24 DIAGNOSIS — R0600 Dyspnea, unspecified: Secondary | ICD-10-CM

## 2012-10-24 NOTE — Telephone Encounter (Signed)
Order has been sent to [facility] and pt aware. Nothing further needed

## 2012-10-24 NOTE — Telephone Encounter (Signed)
She may not need O2 Chk ONO on CPAP/RA to decide

## 2012-10-24 NOTE — Telephone Encounter (Signed)
I spoke with pt and she stated she has not used her O2 x 1 month. She is wearing her CPAP nightly now. Per pt Dr. Joya Gaskins placed her on the O2 but when she saw RA last week he did not mention anything about stopping the O2. Please advise Dr. Elsworth Soho thanks

## 2012-11-01 ENCOUNTER — Telehealth: Payer: Self-pay | Admitting: Pulmonary Disease

## 2012-11-01 DIAGNOSIS — G4733 Obstructive sleep apnea (adult) (pediatric): Secondary | ICD-10-CM

## 2012-11-01 NOTE — Telephone Encounter (Signed)
Yes just put in an order thanks

## 2012-11-01 NOTE — Telephone Encounter (Signed)
[facility] pt is having ONO on CPAP/RA to see if she still needs the O2. Since [facility] supplies this O2 does this need to go to them? Please advise thanks

## 2012-11-01 NOTE — Telephone Encounter (Signed)
Order placed. Jennifer Castillo, CMA  

## 2012-11-08 ENCOUNTER — Ambulatory Visit (INDEPENDENT_AMBULATORY_CARE_PROVIDER_SITE_OTHER): Payer: BC Managed Care – PPO | Admitting: Psychology

## 2012-11-08 DIAGNOSIS — F411 Generalized anxiety disorder: Secondary | ICD-10-CM

## 2012-11-14 ENCOUNTER — Telehealth: Payer: Self-pay | Admitting: Pulmonary Disease

## 2012-11-14 DIAGNOSIS — G4733 Obstructive sleep apnea (adult) (pediatric): Secondary | ICD-10-CM

## 2012-11-14 NOTE — Telephone Encounter (Signed)
ONO on CPAP RA 11/04/12 - no sig  Desaturation Dc O2 - order sent

## 2012-11-15 NOTE — Telephone Encounter (Signed)
Pt returned call.  Spoke with patient and advised of negative ONO as stated by RA below.  Pt verbalized her understanding and asked which DME the d/c order was sent to.  Per pt's chart, order was sent to [facility].  Per pt and the 8.26.14 phone note, her O2 is supplied thru [facility].  Order resent with this correction.  Nothing further needed; will sign off.

## 2012-11-15 NOTE — Telephone Encounter (Signed)
lmomtcb x1 for pt 

## 2012-11-16 ENCOUNTER — Ambulatory Visit (INDEPENDENT_AMBULATORY_CARE_PROVIDER_SITE_OTHER): Payer: BC Managed Care – PPO | Admitting: Psychology

## 2012-11-16 DIAGNOSIS — F411 Generalized anxiety disorder: Secondary | ICD-10-CM

## 2012-12-06 ENCOUNTER — Ambulatory Visit (INDEPENDENT_AMBULATORY_CARE_PROVIDER_SITE_OTHER): Payer: BC Managed Care – PPO | Admitting: Psychology

## 2012-12-06 DIAGNOSIS — F411 Generalized anxiety disorder: Secondary | ICD-10-CM

## 2012-12-16 ENCOUNTER — Encounter: Payer: Self-pay | Admitting: Pulmonary Disease

## 2012-12-16 ENCOUNTER — Ambulatory Visit (INDEPENDENT_AMBULATORY_CARE_PROVIDER_SITE_OTHER): Payer: BC Managed Care – PPO | Admitting: Pulmonary Disease

## 2012-12-16 VITALS — BP 118/82 | HR 78 | Ht 65.0 in | Wt 273.2 lb

## 2012-12-16 DIAGNOSIS — R0600 Dyspnea, unspecified: Secondary | ICD-10-CM

## 2012-12-16 DIAGNOSIS — F32A Depression, unspecified: Secondary | ICD-10-CM

## 2012-12-16 DIAGNOSIS — F329 Major depressive disorder, single episode, unspecified: Secondary | ICD-10-CM

## 2012-12-16 DIAGNOSIS — F3289 Other specified depressive episodes: Secondary | ICD-10-CM

## 2012-12-16 DIAGNOSIS — R0989 Other specified symptoms and signs involving the circulatory and respiratory systems: Secondary | ICD-10-CM

## 2012-12-16 DIAGNOSIS — R0609 Other forms of dyspnea: Secondary | ICD-10-CM

## 2012-12-16 DIAGNOSIS — G4733 Obstructive sleep apnea (adult) (pediatric): Secondary | ICD-10-CM

## 2012-12-16 NOTE — Progress Notes (Signed)
  Subjective:    Patient ID: Kathleen Hatfield, female    DOB: 22-Jan-1956, 57 y.o.   MRN: [id number]  HPI   57 year old woman results for FU of obstructive sleep apnea and insomnia - sees Dr Joya Gaskins for dyspnea.  PMh- chronic diastolic heart failure, severe anxiety and ulcerative colitis. She has reflux and body aches and restless legs syndrome. At the time of her PSG, she weighed 269 pounds with a height of 5 feet 5 inches, and a BMI of 45, neck circumference 16 inches.  Overnight polysomnogram 08/2012 showed severe obstructive sleep apnea with an apnea-hypopnea index of 36 events with predom hypopneas. Latency to sleep was 140 minutes. PLM index was 2.5 events per hour on requip 84m - now increased to twice daily.   Download over 2 weeks shows average pressure 13 cm with no residual events and no leak.  12/16/2012 Changed to 13 cm ONO on CPAP RA 11/04/12 - no sig Desaturation -Dc'd O2  Download 12/04/12  No residuals, min leak, good usage  Pt reports she wears her CPAP everynight x 5-9 hrs a night.  She reports she does feel better with using CPAP Would like a new mask  She was tearful during the interview on last visit and admitted to severe anxiety. She had a prior bad experience with a counselor from her church.  Saw Dr GCheryln Manly -referred to psychiatrist for med change, having a better day today Continues to  Be dyspneic  Review of Systems neg for any significant sore throat, dysphagia, itching, sneezing, nasal congestion or excess/ purulent secretions, fever, chills, sweats, unintended wt loss, pleuritic or exertional cp, hempoptysis, orthopnea pnd or change in chronic leg swelling. Also denies presyncope, palpitations, heartburn, abdominal pain, nausea, vomiting, diarrhea or change in bowel or urinary habits, dysuria,hematuria, rash, arthralgias, visual complaints, headache, numbness weakness or ataxia.     Objective:   Physical Exam  Gen. Pleasant, obese, in no distress ENT - no  lesions, no post nasal drip Neck: No JVD, no thyromegaly, no carotid bruits Lungs: no use of accessory muscles, no dullness to percussion, decreased without rales or rhonchi  Cardiovascular: Rhythm regular, heart sounds  normal, no murmurs or gallops, no peripheral edema Musculoskeletal: No deformities, no cyanosis or clubbing , no tremors       Assessment & Plan:

## 2012-12-16 NOTE — Patient Instructions (Signed)
Your CPAP is set at 13 cm New nasal mask CPAP is very effective

## 2012-12-16 NOTE — Assessment & Plan Note (Signed)
Your CPAP is set at 13 cm New nasal mask CPAP is very effective  Weight loss encouraged, compliance with goal of at least 4-6 hrs every night is the expectation. Advised against medications with sedative side effects Cautioned against driving when sleepy - understanding that sleepiness will vary on a day to day basis

## 2012-12-18 NOTE — Assessment & Plan Note (Signed)
Referred to psych

## 2012-12-21 ENCOUNTER — Ambulatory Visit: Payer: BC Managed Care – PPO | Admitting: Psychology

## 2013-01-04 ENCOUNTER — Ambulatory Visit (INDEPENDENT_AMBULATORY_CARE_PROVIDER_SITE_OTHER): Payer: BC Managed Care – PPO | Admitting: Psychology

## 2013-01-04 DIAGNOSIS — F411 Generalized anxiety disorder: Secondary | ICD-10-CM

## 2013-01-12 ENCOUNTER — Ambulatory Visit (INDEPENDENT_AMBULATORY_CARE_PROVIDER_SITE_OTHER): Payer: BC Managed Care – PPO | Admitting: Critical Care Medicine

## 2013-01-12 ENCOUNTER — Other Ambulatory Visit: Payer: BC Managed Care – PPO

## 2013-01-12 ENCOUNTER — Encounter: Payer: Self-pay | Admitting: Critical Care Medicine

## 2013-01-12 VITALS — BP 116/84 | HR 81 | Temp 98.4°F | Ht 65.0 in | Wt 274.0 lb

## 2013-01-12 DIAGNOSIS — J45901 Unspecified asthma with (acute) exacerbation: Secondary | ICD-10-CM

## 2013-01-12 DIAGNOSIS — J45909 Unspecified asthma, uncomplicated: Secondary | ICD-10-CM

## 2013-01-12 DIAGNOSIS — J4551 Severe persistent asthma with (acute) exacerbation: Secondary | ICD-10-CM

## 2013-01-12 MED ORDER — PREDNISONE 10 MG PO TABS: ORAL_TABLET | ORAL | Status: DC

## 2013-01-12 MED ORDER — MOMETASONE FUROATE 220 MCG/INH IN AEPB: 2.0000 | INHALATION_SPRAY | Freq: Every day | RESPIRATORY_TRACT | Status: DC

## 2013-01-12 MED ORDER — TIOTROPIUM BROMIDE MONOHYDRATE 18 MCG IN CAPS: 18.0000 ug | ORAL_CAPSULE | Freq: Every day | RESPIRATORY_TRACT | Status: DC

## 2013-01-12 NOTE — Progress Notes (Signed)
Subjective:    Patient ID: Kathleen Hatfield, female    DOB: 02/28/1956, 57 y.o.   MRN: [id number]  HPI 01/12/2013 Chief Complaint  Patient presents with  . Follow-up    Feels breathing has worsened with exertion and when reclining.  Has wheezing, nonprod cough, chest tightness, and pain in upper chest/between shoulder blades.      Now coughing and wheezing for three weeks.  Dry cough, tight when cough. Notes more dyspnea with exertion. Hard to walk far.  Cpap per Elsworth Soho now 14cmh20. Full face mask.   Pt notes some pndrip.  No GERD symptoms , on PPI bid.   Past Medical History  Diagnosis Date  . Depression   . Hypertension   . Chronic diastolic heart failure   . Dyspnea   . Asthma   . GERD (gastroesophageal reflux disease)   . Hyperlipidemia     cannot tolerate statins  . Ulcerative colitis     dr Collene Mares     Family History  Problem Relation Age of Onset  . Heart disease Mother   . Hypertension Mother   . Dementia Mother   . Heart disease Father   . COPD Father   . Hypertension Father      History   Social History  . Marital Status: Single    Spouse Name: N/A    Number of Children: N/A  . Years of Education: N/A   Occupational History  . unemployed    Social History Main Topics  . Smoking status: Never Smoker   . Smokeless tobacco: Not on file  . Alcohol Use: No  . Drug Use: No  . Sexual Activity: Not on file   Other Topics Concern  . Not on file   Social History Narrative  . No narrative on file     Allergies  Allergen Reactions  . Ceclor [Cefaclor]   . Sulfa Antibiotics      Outpatient Prescriptions Prior to Visit  Medication Sig Dispense Refill  . acetaminophen (TYLENOL) 500 MG tablet Take 500 mg by mouth every 6 (six) hours as needed for pain.      Marland Kitchen albuterol (PROVENTIL HFA;VENTOLIN HFA) 108 (90 BASE) MCG/ACT inhaler Inhale 2 puffs into the lungs every 6 (six) hours as needed for wheezing.      Marland Kitchen aspirin-acetaminophen-caffeine (EXCEDRIN MIGRAINE)  250-250-65 MG per tablet Take 1 tablet by mouth every 6 (six) hours as needed for pain.      . baclofen (LIORESAL) 10 MG tablet Take 10 mg by mouth 3 (three) times daily as needed.       . benzonatate (TESSALON) 200 MG capsule Take 200 mg by mouth 3 (three) times daily as needed for cough.      . calcium carbonate (TUMS - DOSED IN MG ELEMENTAL CALCIUM) 500 MG chewable tablet Chew 1 tablet by mouth as needed.       . Cholecalciferol (VITAMIN D-3) 5000 UNITS TABS Take 1 tablet by mouth daily.      . DULoxetine (CYMBALTA) 60 MG capsule Take 60 mg by mouth daily.      Marland Kitchen estradiol (ESTRACE) 2 MG tablet Take 1 tablet (2 mg total) by mouth daily.  90 tablet  3  . folic acid (FOLVITE) 1 MG tablet Take 1 mg by mouth 2 (two) times daily.      . furosemide (LASIX) 40 MG tablet Take 80 mg by mouth every morning.       . gabapentin (NEURONTIN) 100 MG capsule TAKE  1 CAPSULE (100 MG TOTAL) BY MOUTH 3 (THREE) TIMES DAILY.  90 capsule  1  . hydrocortisone 2.5 % cream Apply 1 application topically as needed.       . Linaclotide (LINZESS) 145 MCG CAPS Take 145 mcg by mouth. Every other day      . losartan (COZAAR) 100 MG tablet Take 50 mg by mouth daily.       . pantoprazole (PROTONIX) 40 MG tablet Take 1 tablet by mouth 2 (two) times daily.       . pravastatin (PRAVACHOL) 20 MG tablet Take 1 tablet by mouth once a week.      Marland Kitchen rOPINIRole (REQUIP) 4 MG tablet Take 4 mg by mouth 2 (two) times daily.       . traMADol (ULTRAM) 50 MG tablet Take 50 mg by mouth every 6 (six) hours as needed.      . vitamin E 400 UNIT capsule Take 400 Units by mouth daily.       No facility-administered medications prior to visit.       Review of Systems  Constitutional: Positive for chills, diaphoresis, activity change, appetite change, fatigue and unexpected weight change.  HENT: Positive for facial swelling, tinnitus and trouble swallowing. Negative for congestion, dental problem, ear discharge, hearing loss, mouth sores,  nosebleeds, postnasal drip, sinus pressure, sneezing and voice change.   Eyes: Negative for photophobia, discharge, itching and visual disturbance.  Respiratory: Positive for cough and chest tightness. Negative for apnea, choking and stridor.   Cardiovascular: Positive for palpitations.  Gastrointestinal: Positive for nausea, constipation and abdominal distention. Negative for blood in stool.  Genitourinary: Negative for dysuria, urgency, frequency, hematuria, flank pain, decreased urine volume and difficulty urinating.  Musculoskeletal: Positive for arthralgias and myalgias. Negative for back pain, gait problem, joint swelling and neck stiffness.  Skin: Positive for color change. Negative for pallor.  Neurological: Positive for light-headedness. Negative for dizziness, tremors, seizures, syncope, speech difficulty, weakness and numbness.  Hematological: Negative for adenopathy. Does not bruise/bleed easily.  Psychiatric/Behavioral: Negative for confusion, sleep disturbance and agitation. The patient is not nervous/anxious.        Objective:   Physical Exam  Filed Vitals:   01/12/13 1002  BP: 116/84  Pulse: 81  Temp: 98.4 F (36.9 C)  TempSrc: Oral  Height: 5' 5"  (1.651 m)  Weight: 124.286 kg (274 lb)  SpO2: 97%    Gen: Pleasant, obese , in no distress,  normal affect  ENT: No lesions,  mouth clear,  oropharynx clear, no postnasal drip  Neck: No JVD, no TMG, no carotid bruits  Lungs: No use of accessory muscles, no dullness to percussion, clear without rales or rhonchi, expired wheezes noted Cardiovascular: RRR, heart sounds normal, no murmur or gallops, 1+  peripheral edema  Abdomen: soft and NT, no HSM,  BS normal  Musculoskeletal: No deformities, no cyanosis or clubbing  Neuro: alert, non focal  Skin: Warm, no lesions or rashes     Assessment & Plan:   Severe persistent asthma with acute exacerbation Severe persistent asthma with ongoing exacerbation and failure  to respond to [facility] device is in part due to technique related issue Switch over to dry powdered device Plan Start spiriva daily two puff /one capsule Start Asmanex daily two puff  Recycle prednisone 60m Take 4 for three days 3 for three days 2 for three days 1 for three days and stop Labs today to check for allergy factors  Return 1 month  Updated Medication List Outpatient Encounter Prescriptions as of 01/12/2013  Medication Sig  . acetaminophen (TYLENOL) 500 MG tablet Take 500 mg by mouth every 6 (six) hours as needed for pain.  Marland Kitchen albuterol (PROVENTIL HFA;VENTOLIN HFA) 108 (90 BASE) MCG/ACT inhaler Inhale 2 puffs into the lungs every 6 (six) hours as needed for wheezing.  Marland Kitchen aspirin-acetaminophen-caffeine (EXCEDRIN MIGRAINE) 250-250-65 MG per tablet Take 1 tablet by mouth every 6 (six) hours as needed for pain.  . baclofen (LIORESAL) 10 MG tablet Take 10 mg by mouth 3 (three) times daily as needed.   . benzonatate (TESSALON) 200 MG capsule Take 200 mg by mouth 3 (three) times daily as needed for cough.  . calcium carbonate (TUMS - DOSED IN MG ELEMENTAL CALCIUM) 500 MG chewable tablet Chew 1 tablet by mouth as needed.   . Cholecalciferol (VITAMIN D-3) 5000 UNITS TABS Take 1 tablet by mouth daily.  . DULoxetine (CYMBALTA) 60 MG capsule Take 60 mg by mouth daily.  Marland Kitchen estradiol (ESTRACE) 2 MG tablet Take 1 tablet (2 mg total) by mouth daily.  . folic acid (FOLVITE) 1 MG tablet Take 1 mg by mouth 2 (two) times daily.  . furosemide (LASIX) 40 MG tablet Take 80 mg by mouth every morning.   . gabapentin (NEURONTIN) 100 MG capsule TAKE 1 CAPSULE (100 MG TOTAL) BY MOUTH 3 (THREE) TIMES DAILY.  . hydrocortisone 2.5 % cream Apply 1 application topically as needed.   . Linaclotide (LINZESS) 145 MCG CAPS Take 145 mcg by mouth. Every other day  . losartan (COZAAR) 100 MG tablet Take 50 mg by mouth daily.   . pantoprazole (PROTONIX) 40 MG tablet Take 1 tablet by mouth 2 (two) times daily.   .  pravastatin (PRAVACHOL) 20 MG tablet Take 1 tablet by mouth once a week.  Marland Kitchen rOPINIRole (REQUIP) 4 MG tablet Take 4 mg by mouth 2 (two) times daily.   . traMADol (ULTRAM) 50 MG tablet Take 50 mg by mouth every 6 (six) hours as needed.  . vitamin E 400 UNIT capsule Take 400 Units by mouth daily.  . mometasone (ASMANEX 120 METERED DOSES) 220 MCG/INH inhaler Inhale 2 puffs into the lungs daily.  . predniSONE (DELTASONE) 10 MG tablet Take 4 for three days 3 for three days 2 for three days 1 for three days and stop  . tiotropium (SPIRIVA HANDIHALER) 18 MCG inhalation capsule Place 1 capsule (18 mcg total) into inhaler and inhale daily.

## 2013-01-12 NOTE — Patient Instructions (Signed)
Start spiriva daily two puff /one capsule Start Asmanex daily two puff  Recycle prednisone 36m Take 4 for three days 3 for three days 2 for three days 1 for three days and stop Labs today to check for allergy factors  Return 1 month

## 2013-01-13 LAB — IGE: IgE (Immunoglobulin E), Serum: 79.1 IU/mL (ref 0.0–180.0)

## 2013-01-13 NOTE — Assessment & Plan Note (Signed)
Severe persistent asthma with ongoing exacerbation and failure to respond to [facility] device is in part due to technique related issue Switch over to dry powdered device Plan Start spiriva daily two puff /one capsule Start Asmanex daily two puff  Recycle prednisone 55m Take 4 for three days 3 for three days 2 for three days 1 for three days and stop Labs today to check for allergy factors  Return 1 month

## 2013-01-16 NOTE — Progress Notes (Signed)
Quick Note:  Called, spoke with pt. Informed her of lab results per Dr. Joya Gaskins. She verbalized understanding of this. Pt is aware PW will discuss further with her on f/u OV. She verbalized understanding of this and voiced no further questions or concerns at this time.  Note: Pt is NOT expecting. PW aware of this. We are to disregard that comment. ______

## 2013-01-25 ENCOUNTER — Ambulatory Visit (INDEPENDENT_AMBULATORY_CARE_PROVIDER_SITE_OTHER): Payer: BC Managed Care – PPO | Admitting: Psychology

## 2013-01-25 DIAGNOSIS — F411 Generalized anxiety disorder: Secondary | ICD-10-CM

## 2013-02-06 ENCOUNTER — Ambulatory Visit: Payer: BC Managed Care – PPO | Admitting: Critical Care Medicine

## 2013-02-08 ENCOUNTER — Ambulatory Visit (INDEPENDENT_AMBULATORY_CARE_PROVIDER_SITE_OTHER)
Admission: RE | Admit: 2013-02-08 | Discharge: 2013-02-08 | Disposition: A | Discharge status: Home / Self Care | Payer: BC Managed Care – PPO | Source: Ambulatory Visit | Attending: Critical Care Medicine | Admitting: Critical Care Medicine

## 2013-02-08 ENCOUNTER — Ambulatory Visit (INDEPENDENT_AMBULATORY_CARE_PROVIDER_SITE_OTHER): Payer: BC Managed Care – PPO | Admitting: Critical Care Medicine

## 2013-02-08 ENCOUNTER — Encounter: Payer: Self-pay | Admitting: Critical Care Medicine

## 2013-02-08 ENCOUNTER — Telehealth: Payer: Self-pay | Admitting: Critical Care Medicine

## 2013-02-08 VITALS — BP 140/84 | HR 84 | Temp 98.7°F | Ht 65.25 in | Wt 277.6 lb

## 2013-02-08 DIAGNOSIS — R0989 Other specified symptoms and signs involving the circulatory and respiratory systems: Secondary | ICD-10-CM

## 2013-02-08 DIAGNOSIS — J45901 Unspecified asthma with (acute) exacerbation: Secondary | ICD-10-CM

## 2013-02-08 DIAGNOSIS — Z9989 Dependence on other enabling machines and devices: Secondary | ICD-10-CM

## 2013-02-08 DIAGNOSIS — J4551 Severe persistent asthma with (acute) exacerbation: Secondary | ICD-10-CM

## 2013-02-08 DIAGNOSIS — R0609 Other forms of dyspnea: Secondary | ICD-10-CM

## 2013-02-08 DIAGNOSIS — R0600 Dyspnea, unspecified: Secondary | ICD-10-CM

## 2013-02-08 DIAGNOSIS — J189 Pneumonia, unspecified organism: Secondary | ICD-10-CM

## 2013-02-08 DIAGNOSIS — G4733 Obstructive sleep apnea (adult) (pediatric): Secondary | ICD-10-CM

## 2013-02-08 MED ORDER — LEVOFLOXACIN 500 MG PO TABS: 500.0000 mg | ORAL_TABLET | Freq: Every day | ORAL | Status: DC

## 2013-02-08 NOTE — Telephone Encounter (Signed)
Gave pt results.  Her cxr is abn.  She will need a CT Chest  i ordered  Cancel her ONO .  She does not need oxygen Cancel her oxygen order

## 2013-02-08 NOTE — Patient Instructions (Addendum)
Stop spiriva and asmanex Chest xray today, may need CT Chest later I will discuss your case with Drs Elsworth Soho and Burt Knack Oxygen 3Liters to go with cpap will be ordered at night A download from cpap and also overnight oxygen test on oxygen and cpap will be ordered No other medication changes for now Spirometry was done today Return 1 month

## 2013-02-08 NOTE — Progress Notes (Signed)
Subjective:    Patient ID: Kathleen Hatfield, female    DOB: 11/05/1955, 57 y.o.   MRN: [id number]  HPI  02/08/2013 Chief Complaint  Patient presents with  . Acute Visit    Increased SOB with activity x 1 wk, some wheezing, chest tightness/soreness, and nonprod cough.  No f/c/s.  Spiriva and Asmanex haven't helped.  Pt more dyspneic and wheezing and unimproved with spiriva/asmanex of much use.  The patient has no real cough or mucus production. Systemic steroids have not been of benefit. The patient notes ongoing wheezing and chest tightness. The cough is dry. She has not had any improvement with a variety of HFA and dry powdered devices.    Past Medical History  Diagnosis Date  . Depression   . Hypertension   . Chronic diastolic heart failure   . Dyspnea   . Asthma   . GERD (gastroesophageal reflux disease)   . Hyperlipidemia     cannot tolerate statins  . Ulcerative colitis     dr Collene Mares     Family History  Problem Relation Age of Onset  . Heart disease Mother   . Hypertension Mother   . Dementia Mother   . Heart disease Father   . COPD Father   . Hypertension Father      History   Social History  . Marital Status: Single    Spouse Name: N/A    Number of Children: N/A  . Years of Education: N/A   Occupational History  . unemployed    Social History Main Topics  . Smoking status: Never Smoker   . Smokeless tobacco: Not on file  . Alcohol Use: No  . Drug Use: No  . Sexual Activity: Not on file   Other Topics Concern  . Not on file   Social History Narrative  . No narrative on file     Allergies  Allergen Reactions  . Ceclor [Cefaclor]   . Sulfa Antibiotics      Outpatient Prescriptions Prior to Visit  Medication Sig Dispense Refill  . acetaminophen (TYLENOL) 500 MG tablet Take 500 mg by mouth every 6 (six) hours as needed for pain.      Marland Kitchen albuterol (PROVENTIL HFA;VENTOLIN HFA) 108 (90 BASE) MCG/ACT inhaler Inhale 2 puffs into the lungs every 6 (six)  hours as needed for wheezing.      Marland Kitchen aspirin-acetaminophen-caffeine (EXCEDRIN MIGRAINE) 250-250-65 MG per tablet Take 1 tablet by mouth every 6 (six) hours as needed for pain.      . baclofen (LIORESAL) 10 MG tablet Take 10 mg by mouth 3 (three) times daily as needed.       . benzonatate (TESSALON) 200 MG capsule Take 200 mg by mouth 3 (three) times daily as needed for cough.      . calcium carbonate (TUMS - DOSED IN MG ELEMENTAL CALCIUM) 500 MG chewable tablet Chew 1 tablet by mouth as needed.       . Cholecalciferol (VITAMIN D-3) 5000 UNITS TABS Take 1 tablet by mouth daily.      . DULoxetine (CYMBALTA) 60 MG capsule Take 60 mg by mouth every morning.       Marland Kitchen estradiol (ESTRACE) 2 MG tablet Take 1 tablet (2 mg total) by mouth daily.  90 tablet  3  . folic acid (FOLVITE) 1 MG tablet Take 1 mg by mouth 2 (two) times daily.      . furosemide (LASIX) 40 MG tablet Take 80 mg by mouth every morning.       Marland Kitchen  gabapentin (NEURONTIN) 100 MG capsule TAKE 1 CAPSULE (100 MG TOTAL) BY MOUTH 3 (THREE) TIMES DAILY.  90 capsule  1  . hydrocortisone 2.5 % cream Apply 1 application topically as needed.       . Linaclotide (LINZESS) 145 MCG CAPS Take 145 mcg by mouth. Every other day      . losartan (COZAAR) 100 MG tablet Take 50 mg by mouth daily.       . pantoprazole (PROTONIX) 40 MG tablet Take 1 tablet by mouth 2 (two) times daily.       . pravastatin (PRAVACHOL) 20 MG tablet Take 1 tablet by mouth once a week.      Marland Kitchen rOPINIRole (REQUIP) 4 MG tablet Take 4 mg by mouth 2 (two) times daily.       . traMADol (ULTRAM) 50 MG tablet Take 50 mg by mouth every 6 (six) hours as needed.      . vitamin E 400 UNIT capsule Take 400 Units by mouth daily.      . mometasone (ASMANEX 120 METERED DOSES) 220 MCG/INH inhaler Inhale 2 puffs into the lungs daily.  1 Inhaler  12  . tiotropium (SPIRIVA HANDIHALER) 18 MCG inhalation capsule Place 1 capsule (18 mcg total) into inhaler and inhale daily.  30 capsule  12  . predniSONE  (DELTASONE) 10 MG tablet Take 4 for three days 3 for three days 2 for three days 1 for three days and stop  30 tablet  0   No facility-administered medications prior to visit.       Review of Systems  Constitutional: Positive for chills, diaphoresis, activity change, appetite change, fatigue and unexpected weight change.  HENT: Positive for facial swelling, tinnitus and trouble swallowing. Negative for congestion, dental problem, ear discharge, hearing loss, mouth sores, nosebleeds, postnasal drip, sinus pressure, sneezing and voice change.   Eyes: Negative for photophobia, discharge, itching and visual disturbance.  Respiratory: Positive for cough and chest tightness. Negative for apnea, choking and stridor.   Cardiovascular: Positive for palpitations.  Gastrointestinal: Positive for nausea, constipation and abdominal distention. Negative for blood in stool.  Genitourinary: Negative for dysuria, urgency, frequency, hematuria, flank pain, decreased urine volume and difficulty urinating.  Musculoskeletal: Positive for arthralgias and myalgias. Negative for back pain, gait problem, joint swelling and neck stiffness.  Skin: Positive for color change. Negative for pallor.  Neurological: Positive for light-headedness. Negative for dizziness, tremors, seizures, syncope, speech difficulty, weakness and numbness.  Hematological: Negative for adenopathy. Does not bruise/bleed easily.  Psychiatric/Behavioral: Negative for confusion, sleep disturbance and agitation. The patient is not nervous/anxious.        Objective:   Physical Exam  Filed Vitals:   02/08/13 1056  BP: 140/84  Pulse: 84  Temp: 98.7 F (37.1 C)  TempSrc: Oral  Height: 5' 5.25" (1.657 m)  Weight: 277 lb 9.6 oz (125.919 kg)  SpO2: 96%    Gen: Pleasant, obese , in no distress,  normal affect  ENT: No lesions,  mouth clear,  oropharynx clear, no postnasal drip  Neck: No JVD, no TMG, no carotid bruits  Lungs: No use of  accessory muscles, no dullness to percussion, clear without rales or rhonchi, expired wheezes noted  Cardiovascular: RRR, heart sounds normal, no murmur or gallops, 1+  peripheral edema  Abdomen: soft and NT, no HSM,  BS normal  Musculoskeletal: No deformities, no cyanosis or clubbing  Neuro: alert, non focal  Skin: Warm, no lesions or rashes  Dg Chest 2 View  02/08/2013   CLINICAL DATA:  Shortness of breath.  Chest pain.  EXAM: CHEST  2 VIEW  COMPARISON:  09/14/2010  .  FINDINGS: The mediastinum and hilar structures are normal. There is cardiomegaly with mild pulmonary venous congestion.  Left lower lobe and lingular mild infiltrate present. No pneumothorax. Degenerative changes thoracic spine.  IMPRESSION: 1. Mild lingular and left lower lobe infiltrate suggesting pneumonia. 2. Mild congestive heart failure cannot be excluded.   Electronically Signed   By: Marcello Moores  Register   On: 02/08/2013 13:30   Spirometry 02/08/2013: FEV1 57% predicted FVC 71% predicted FEV1 to FVC ratio 64% predicted     Assessment & Plan:   CAP (community acquired pneumonia) Left lingular pneumonia no organism specified Plan Levaquin 500 milligrams for 10 days was prescribed  OSA (obstructive sleep apnea) Moderate sleep apnea with nocturnal desaturation treated with 13 cm of water pressure and resolved with no persistent hypoxemia seen Plan per sleep medicine  Severe persistent asthma with acute exacerbation This patient has poorly responded to bronchodilator therapy. The patient's pulmonary functions clearly show airway resistance issues but despite this multiple different HFA and dry powdered devices have failed to elicit a response. Also the patient has failed to respond to corticosteroids. Note pulmonary function studies again confirmed the persistence of airway obstruction with FEV1 of 57% upper directed. In FEV1 to FVC ratio is still low at 64% of predicted. The only additional therapy that could be  considered would be nebulized therapy as the patient's technique has not been 100%. Note chest x-ray today shows lingular infiltrate and echocardiogram as previously shown right ventricular muscle hypertrophy in concern for pulmonary hypertension Plan I have queried cardiology to see if her right heart catheterization is necessary in this patient We stopped the Asmanex and Spiriva as there was no benefit to bronchodilator therapy, a consideration for nebulized therapy could be made CT scan of the chest will be obtained to investigate the left lower lobe process in more detail but Levaquin 500 milligrams daily for 10 days will be prescribed We will not re\re prescribe systemic steroids as there was no benefit to this    Updated Medication List Outpatient Encounter Prescriptions as of 02/08/2013  Medication Sig  . acetaminophen (TYLENOL) 500 MG tablet Take 500 mg by mouth every 6 (six) hours as needed for pain.  Marland Kitchen albuterol (PROVENTIL HFA;VENTOLIN HFA) 108 (90 BASE) MCG/ACT inhaler Inhale 2 puffs into the lungs every 6 (six) hours as needed for wheezing.  Marland Kitchen aspirin-acetaminophen-caffeine (EXCEDRIN MIGRAINE) 250-250-65 MG per tablet Take 1 tablet by mouth every 6 (six) hours as needed for pain.  . baclofen (LIORESAL) 10 MG tablet Take 10 mg by mouth 3 (three) times daily as needed.   . benzonatate (TESSALON) 200 MG capsule Take 200 mg by mouth 3 (three) times daily as needed for cough.  . calcium carbonate (TUMS - DOSED IN MG ELEMENTAL CALCIUM) 500 MG chewable tablet Chew 1 tablet by mouth as needed.   . Cholecalciferol (VITAMIN D-3) 5000 UNITS TABS Take 1 tablet by mouth daily.  . DULoxetine (CYMBALTA) 30 MG capsule Take 30 mg by mouth every evening.  . DULoxetine (CYMBALTA) 60 MG capsule Take 60 mg by mouth every morning.   Marland Kitchen estradiol (ESTRACE) 2 MG tablet Take 1 tablet (2 mg total) by mouth daily.  . folic acid (FOLVITE) 1 MG tablet Take 1 mg by mouth 2 (two) times daily.  . furosemide  (LASIX) 40 MG tablet Take 80 mg by mouth every  morning.   . gabapentin (NEURONTIN) 100 MG capsule TAKE 1 CAPSULE (100 MG TOTAL) BY MOUTH 3 (THREE) TIMES DAILY.  . hydrocortisone 2.5 % cream Apply 1 application topically as needed.   . Linaclotide (LINZESS) 145 MCG CAPS Take 145 mcg by mouth. Every other day  . losartan (COZAAR) 100 MG tablet Take 50 mg by mouth daily.   . pantoprazole (PROTONIX) 40 MG tablet Take 1 tablet by mouth 2 (two) times daily.   . pravastatin (PRAVACHOL) 20 MG tablet Take 1 tablet by mouth once a week.  Marland Kitchen rOPINIRole (REQUIP) 4 MG tablet Take 4 mg by mouth 2 (two) times daily.   . traMADol (ULTRAM) 50 MG tablet Take 50 mg by mouth every 6 (six) hours as needed.  . vitamin E 400 UNIT capsule Take 400 Units by mouth daily.  . [DISCONTINUED] mometasone (ASMANEX 120 METERED DOSES) 220 MCG/INH inhaler Inhale 2 puffs into the lungs daily.  . [DISCONTINUED] tiotropium (SPIRIVA HANDIHALER) 18 MCG inhalation capsule Place 1 capsule (18 mcg total) into inhaler and inhale daily.  . [DISCONTINUED] predniSONE (DELTASONE) 10 MG tablet Take 4 for three days 3 for three days 2 for three days 1 for three days and stop

## 2013-02-09 DIAGNOSIS — J189 Pneumonia, unspecified organism: Secondary | ICD-10-CM | POA: Insufficient documentation

## 2013-02-09 NOTE — Assessment & Plan Note (Signed)
This patient has poorly responded to bronchodilator therapy. The patient's pulmonary functions clearly show airway resistance issues but despite this multiple different HFA and dry powdered devices have failed to elicit a response. Also the patient has failed to respond to corticosteroids. Note pulmonary function studies again confirmed the persistence of airway obstruction with FEV1 of 57% upper directed. In FEV1 to FVC ratio is still low at 64% of predicted. The only additional therapy that could be considered would be nebulized therapy as the patient's technique has not been 100%. Note chest x-ray today shows lingular infiltrate and echocardiogram as previously shown right ventricular muscle hypertrophy in concern for pulmonary hypertension Plan I have queried cardiology to see if her right heart catheterization is necessary in this patient We stopped the Asmanex and Spiriva as there was no benefit to bronchodilator therapy, a consideration for nebulized therapy could be made CT scan of the chest will be obtained to investigate the left lower lobe process in more detail but Levaquin 500 milligrams daily for 10 days will be prescribed We will not re\re prescribe systemic steroids as there was no benefit to this

## 2013-02-09 NOTE — Assessment & Plan Note (Signed)
Moderate sleep apnea with nocturnal desaturation treated with 13 cm of water pressure and resolved with no persistent hypoxemia seen Plan per sleep medicine

## 2013-02-09 NOTE — Assessment & Plan Note (Signed)
Left lingular pneumonia no organism specified Plan Levaquin 500 milligrams for 10 days was prescribed

## 2013-02-09 NOTE — Assessment & Plan Note (Signed)
Review asthma assessment The dyspnea in this patient appears to be multifactorial

## 2013-02-10 ENCOUNTER — Telehealth: Payer: Self-pay | Admitting: Critical Care Medicine

## 2013-02-10 ENCOUNTER — Ambulatory Visit (INDEPENDENT_AMBULATORY_CARE_PROVIDER_SITE_OTHER)
Admission: RE | Admit: 2013-02-10 | Discharge: 2013-02-10 | Disposition: A | Discharge status: Home / Self Care | Payer: BC Managed Care – PPO | Source: Ambulatory Visit | Attending: Critical Care Medicine | Admitting: Critical Care Medicine

## 2013-02-10 DIAGNOSIS — J189 Pneumonia, unspecified organism: Secondary | ICD-10-CM

## 2013-02-10 DIAGNOSIS — R0989 Other specified symptoms and signs involving the circulatory and respiratory systems: Secondary | ICD-10-CM

## 2013-02-10 DIAGNOSIS — J4551 Severe persistent asthma with (acute) exacerbation: Secondary | ICD-10-CM

## 2013-02-10 DIAGNOSIS — R0600 Dyspnea, unspecified: Secondary | ICD-10-CM

## 2013-02-10 DIAGNOSIS — R0609 Other forms of dyspnea: Secondary | ICD-10-CM

## 2013-02-10 MED ORDER — IOHEXOL 350 MG/ML SOLN
100.0000 mL | Freq: Once | INTRAVENOUS | Status: AC | PRN
  Administered 2013-02-10: 80 mL via INTRAVENOUS

## 2013-02-10 MED ORDER — ALBUTEROL SULFATE (2.5 MG/3ML) 0.083% IN NEBU: INHALATION_SOLUTION | RESPIRATORY_TRACT | Status: DC

## 2013-02-10 NOTE — Telephone Encounter (Signed)
Call pt tell her CT chest shows just scar tissue in Left lung , no pneumonia.  It shows moderate small airway obstruction Suspect inhalers not getting into the lungs deep enough Suggest a nebulizer with bronchodilators via nebulizer  I suggest albuterol 2.78m 3-4 x daily   Ask pt if she wants that set up

## 2013-02-10 NOTE — Telephone Encounter (Signed)
Called, spoke with pt.  Informed her of below per Dr. Joya Gaskins.  She verbalized understanding of this.  She would like to proceed with albuterol nebs.  She would like this sent to [facility].  Pt is aware this will not be taken care of until next week as PW is not in the office right now to sign albuterol rx.  She is ok with this.  I offered to send albuterol rx to local pharm.  Pt declined at this time.  I have placed order to have [facility] provide pt with neb machine and albuterol meds and placed rx in PW's folder to sign.  Pt is aware.  Dr. Joya Gaskins, pt is wanting to know if there is any fluid in her lungs now.  She is ok with call back next week.  Please advise.  Thank you.

## 2013-02-10 NOTE — Telephone Encounter (Signed)
Spoke with pt.  She is aware an order was sent to cancel ONO and o2.

## 2013-02-10 NOTE — Telephone Encounter (Signed)
No fluid in lungs Will sign rx next week

## 2013-02-13 NOTE — Telephone Encounter (Signed)
Albuterol rx was signed by PW.  I have given this to [facility].

## 2013-02-13 NOTE — Telephone Encounter (Signed)
Called, spoke with pt.  Informed her of below.  She verbalized understanding. Will route to my box to f/u on sending rx to [facility].

## 2013-02-16 ENCOUNTER — Telehealth: Payer: Self-pay | Admitting: Critical Care Medicine

## 2013-02-16 DIAGNOSIS — J4550 Severe persistent asthma, uncomplicated: Secondary | ICD-10-CM

## 2013-02-16 NOTE — Telephone Encounter (Signed)
Pt was to have new neb machine ordered through [facility]. This order was never placed. Order will be placed for this. Pt is aware.

## 2013-02-20 ENCOUNTER — Telehealth: Payer: Self-pay | Admitting: Critical Care Medicine

## 2013-02-20 ENCOUNTER — Ambulatory Visit: Payer: BC Managed Care – PPO | Admitting: Critical Care Medicine

## 2013-02-20 NOTE — Telephone Encounter (Signed)
Spoke with the pt and she states that she just had her machine and med delivered to her Nothing further needed per pt

## 2013-02-23 ENCOUNTER — Telehealth: Payer: Self-pay

## 2013-02-23 ENCOUNTER — Encounter: Payer: Self-pay | Admitting: Cardiovascular Disease

## 2013-02-23 ENCOUNTER — Ambulatory Visit (INDEPENDENT_AMBULATORY_CARE_PROVIDER_SITE_OTHER): Payer: BC Managed Care – PPO | Admitting: Cardiovascular Disease

## 2013-02-23 VITALS — BP 156/83 | HR 87 | Ht 65.0 in | Wt 275.2 lb

## 2013-02-23 DIAGNOSIS — R0600 Dyspnea, unspecified: Secondary | ICD-10-CM

## 2013-02-23 DIAGNOSIS — R0609 Other forms of dyspnea: Secondary | ICD-10-CM

## 2013-02-23 DIAGNOSIS — R0989 Other specified symptoms and signs involving the circulatory and respiratory systems: Secondary | ICD-10-CM

## 2013-02-23 DIAGNOSIS — I10 Essential (primary) hypertension: Secondary | ICD-10-CM

## 2013-02-23 LAB — BASIC METABOLIC PANEL
BUN: 12 mg/dL (ref 6–23)
CO2: 31 mEq/L (ref 19–32)
Calcium: 9.6 mg/dL (ref 8.4–10.5)
Chloride: 100 mEq/L (ref 96–112)
Creatinine, Ser: 0.8 mg/dL (ref 0.4–1.2)
GFR: 77.16 mL/min (ref >60.00)
Glucose, Bld: 131 mg/dL — ABNORMAL HIGH (ref 70–99)
Potassium: 2.9 mEq/L — ABNORMAL LOW (ref 3.5–5.1)
Sodium: 141 mEq/L (ref 135–145)

## 2013-02-23 LAB — CBC WITH DIFFERENTIAL/PLATELET
Basophils Absolute: 0 10*3/uL (ref 0.0–0.1)
Basophils Relative: 0.4 % (ref 0.0–3.0)
Eosinophils Absolute: 0.1 10*3/uL (ref 0.0–0.7)
Eosinophils Relative: 1.3 % (ref 0.0–5.0)
HCT: 31.8 % — ABNORMAL LOW (ref 36.0–46.0)
Hemoglobin: 10.2 g/dL — ABNORMAL LOW (ref 12.0–15.0)
Lymphocytes Relative: 31 % (ref 12.0–46.0)
Lymphs Abs: 3.3 10*3/uL (ref 0.7–4.0)
MCHC: 32.2 g/dL (ref 30.0–36.0)
MCV: 74 fl — ABNORMAL LOW (ref 78.0–100.0)
Monocytes Absolute: 0.9 10*3/uL (ref 0.1–1.0)
Monocytes Relative: 8.4 % (ref 3.0–12.0)
Neutro Abs: 6.2 10*3/uL (ref 1.4–7.7)
Neutrophils Relative %: 58.9 % (ref 43.0–77.0)
Platelets: 404 10*3/uL — ABNORMAL HIGH (ref 150.0–400.0)
RBC: 4.29 Mil/uL (ref 3.87–5.11)
RDW: 17.5 % — ABNORMAL HIGH (ref 11.5–14.6)
WBC: 10.5 10*3/uL (ref 4.5–10.5)

## 2013-02-23 LAB — PROTIME-INR
INR: 1 ratio (ref 0.8–1.0)
Prothrombin Time: 10.5 s (ref 10.2–12.4)

## 2013-02-23 MED ORDER — POTASSIUM CHLORIDE CRYS ER 20 MEQ PO TBCR: 20.0000 meq | EXTENDED_RELEASE_TABLET | Freq: Every day | ORAL | Status: DC

## 2013-02-23 NOTE — Patient Instructions (Signed)
Your physician has requested that you have a cardiac catheterization. Cardiac catheterization is used to diagnose and/or treat various heart conditions. Doctors may recommend this procedure for a number of different reasons. The most common reason is to evaluate chest pain. Chest pain can be a symptom of coronary artery disease (CAD), and cardiac catheterization can show whether plaque is narrowing or blocking your heart's arteries. This procedure is also used to evaluate the valves, as well as measure the blood flow and oxygen levels in different parts of your heart. For further information please visit [email or web address]. Please follow instruction sheet, as given.  Your physician recommends that you have lab work today: BMP, CBC and PT/INR  Your physician recommends that you continue on your current medications as directed. Please refer to the Current Medication list given to you today.

## 2013-02-23 NOTE — Telephone Encounter (Signed)
I spoke with the pt and made her aware of BMP results and Dr Cooper's orders.   Notes Recorded by [name], MD on 02/23/2013 at 5:27 PM Take KDur 40 meq additional today and 20 meq additional daily. Will repeat BMET next week at time of procedure.  The pt is currently not taking a potassium supplement.  Rx sent to pharmacy and the pt will take two tablets when she picks up the Rx tonight and then start taking one tablet daily tomorrow.  Will repeat BMP during 02/27/13 cardiac cath.

## 2013-02-23 NOTE — Telephone Encounter (Signed)
This encounter was created in error - please disregard.

## 2013-02-23 NOTE — Progress Notes (Signed)
HPI:  57 year old woman presenting for followup evaluation. The patient returns because of continued dyspnea with minimal exertion. She's been followed very closely in the pulmonary clinic over the last year. I saw her initially in April 2014 and evaluated her with an echocardiogram and nuclear stress test. There was some increase in wall thickness involving the right ventricular free wall. Other than that there were no significant findings from the echocardiogram. There is no ischemia demonstrated on her nuclear study.  The patient has had persistent dyspnea with minimal exertion. Her symptoms have not improved with aggressive pulmonary therapies. She continues to complain of shortness of breath and occasional wheezing. She has not had tightness in her chest. Leg swelling is improved on diuretics. There is a component of orthopnea but no PND.  Outpatient Encounter Prescriptions as of 02/23/2013  Medication Sig  . acetaminophen (TYLENOL) 500 MG tablet Take 500 mg by mouth every 6 (six) hours as needed for pain.  Marland Kitchen albuterol (PROVENTIL HFA;VENTOLIN HFA) 108 (90 BASE) MCG/ACT inhaler Inhale 2 puffs into the lungs every 6 (six) hours as needed for wheezing.  Marland Kitchen albuterol (PROVENTIL) (2.5 MG/3ML) 0.083% nebulizer solution Use 1 vial in nebulizer 3-4 times  . aspirin-acetaminophen-caffeine (EXCEDRIN MIGRAINE) 250-250-65 MG per tablet Take 1 tablet by mouth every 6 (six) hours as needed for pain.  . baclofen (LIORESAL) 10 MG tablet Take 10 mg by mouth 3 (three) times daily as needed.   . benzonatate (TESSALON) 200 MG capsule Take 200 mg by mouth 3 (three) times daily as needed for cough.  . calcium carbonate (TUMS - DOSED IN MG ELEMENTAL CALCIUM) 500 MG chewable tablet Chew 1 tablet by mouth as needed.   . Cholecalciferol (VITAMIN D-3) 5000 UNITS TABS Take 1 tablet by mouth daily.  . DULoxetine (CYMBALTA) 30 MG capsule Take 30 mg by mouth every evening.  . DULoxetine (CYMBALTA) 60 MG capsule Take 60 mg  by mouth every morning.   Marland Kitchen estradiol (ESTRACE) 2 MG tablet Take 1 tablet (2 mg total) by mouth daily.  . folic acid (FOLVITE) 1 MG tablet Take 1 mg by mouth 2 (two) times daily.  . furosemide (LASIX) 40 MG tablet Take 80 mg by mouth every morning.   . gabapentin (NEURONTIN) 100 MG capsule TAKE 1 CAPSULE (100 MG TOTAL) BY MOUTH 3 (THREE) TIMES DAILY.  . hydrocortisone 2.5 % cream Apply 1 application topically as needed.   Marland Kitchen levofloxacin (LEVAQUIN) 500 MG tablet Take 1 tablet (500 mg total) by mouth daily.  . Linaclotide (LINZESS) 145 MCG CAPS Take 145 mcg by mouth. Every other day  . losartan (COZAAR) 100 MG tablet Take 50 mg by mouth daily.   . pantoprazole (PROTONIX) 40 MG tablet Take 1 tablet by mouth 2 (two) times daily.   . pravastatin (PRAVACHOL) 20 MG tablet Take 1 tablet by mouth once a week.  Marland Kitchen rOPINIRole (REQUIP) 4 MG tablet Take 4 mg by mouth 2 (two) times daily.   . traMADol (ULTRAM) 50 MG tablet Take 50 mg by mouth every 6 (six) hours as needed.  . vitamin E 400 UNIT capsule Take 400 Units by mouth daily.    Allergies  Allergen Reactions  . Ceclor [Cefaclor]   . Sulfa Antibiotics     Past Medical History  Diagnosis Date  . Depression   . Hypertension   . Chronic diastolic heart failure   . Dyspnea   . Asthma   . GERD (gastroesophageal reflux disease)   . Hyperlipidemia  cannot tolerate statins  . Ulcerative colitis     dr Collene Mares    ROS: Negative except as per HPI  BP 156/83  Pulse 87  Ht 5' 5"  (1.651 m)  Wt 275 lb 3.2 oz (124.83 kg)  BMI 45.80 kg/m2  PHYSICAL EXAM: Pt is alert and oriented, NAD, she is tearful HEENT: normal Neck: JVP - normal, carotids 2+= without bruits Lungs: CTA bilaterally CV: RRR without murmur or gallop Abd: soft, NT, Positive BS, no hepatomegaly, obese Ext: 1+ pretibial edema bilaterally, distal pulses intact and equal Skin: warm/dry no rash  EKG:  Normal sinus rhythm 87 beats per minute, incomplete right bundle branch  block.  Myoview scan 06/23/2012: QPS  Raw Data Images: Normal; no motion artifact; normal heart/lung ratio.  Stress Images: Normal homogeneous uptake in all areas of the myocardium.  Rest Images: Normal homogeneous uptake in all areas of the myocardium.  Subtraction (SDS): Normal  Transient Ischemic Dilatation (Normal <1.22): 1.22  Lung/Heart Ratio (Normal <0.45): 0.40  Quantitative Gated Spect Images  QGS EDV: 74 ml  QGS ESV: 13 ml  Impression  Exercise Capacity: Lexiscan with no exercise.  BP Response: Normal blood pressure response.  Clinical Symptoms: No significant symptoms noted.  ECG Impression: No significant ST segment change suggestive of ischemia.  Comparison with Prior Nuclear Study: No images to compare  Overall Impression: Normal stress nuclear study. No evidence of ischemia.  LV Ejection Fraction: 82%. LV Wall Motion: NL LV Function; NL Wall Motion.    2-D echocardiogram 06/17/2012: ------------------------------------------------------------ Left ventricle: The cavity size was normal. Systolic function was normal. The estimated ejection fraction was in the range of 60% to 65%. Wall motion was normal; there were no regional wall motion abnormalities. Features are consistent with a pseudonormal left ventricular filling pattern, with concomitant abnormal relaxation and increased filling pressure (grade 2 diastolic dysfunction).  ------------------------------------------------------------ Aortic valve: Trileaflet; mildly thickened leaflets. Cusp separation was normal. Doppler: There was mild stenosis. No regurgitation. Peak velocity ratio of LVOT to aortic valve: 0.71. Indexed valve area: 1.09cm^2/m^2 (Vmax). Peak gradient: 46m Hg (S).  ------------------------------------------------------------ Aorta: The aorta was normal, not dilated, and non-diseased.  ------------------------------------------------------------ Mitral valve: Structurally normal valve.  Leaflet separation was normal. Doppler: Transvalvular velocity was within the normal range. There was no evidence for stenosis. Trivial regurgitation. Peak gradient: 434mHg (D).  ------------------------------------------------------------ Left atrium: The atrium was mildly dilated.  ------------------------------------------------------------ Right ventricle: The cavity size was normal. Wall thickness was moderately increased. There was moderate hypertrophy. Systolic function was normal.  ------------------------------------------------------------ Pulmonic valve: Poorly visualized. Doppler: Trivial regurgitation.  ------------------------------------------------------------ Tricuspid valve: Structurally normal valve. Leaflet separation was normal. Doppler: Transvalvular velocity was within the normal range. Trivial regurgitation.  ------------------------------------------------------------ Right atrium: The atrium was normal in size.  ------------------------------------------------------------ Pericardium: There was no pericardial effusion.  ------------------------------------------------------------ Systemic veins: Inferior vena cava: Not visualized.  ------------------------------------------------------------ Post procedure conclusions Ascending Aorta:  - The aorta was normal, not dilated, and non-diseased.  CT angiogram of the chest 02/10/2013: FINDINGS: Good contrast bolus timing in the pulmonary arterial tree.  No focal filling defect identified in the pulmonary arterial tree to suggest the presence of acute pulmonary embolism. Main pulmonary artery caliber is about 31 mm diameter, versus about 35 mm of the adjacent ascending aorta.  Major airways are patent. Cardiomegaly. Lingula atelectasis. Mild dependent atelectasis elsewhere. There is some mosaic attenuation in the lungs, mostly near the hila.  No pericardial or pleural effusion. Negative visualized  aorta. Negative thoracic inlet. No mediastinal or  axillary lymphadenopathy.  Decreased density in the liver in keeping with steatosis. Otherwise negative visualized upper abdominal viscera.  No acute osseous abnormality identified.  Review of the MIP images confirms the above findings.  IMPRESSION: 1. No evidence of acute pulmonary embolus. No pneumonia.  2. Some mosaic attenuation in the lungs, such as due to chronic small airway disease. Lingula atelectasis.  3. Cardiomegaly. No pericardial or pleural effusion.  4. Hepatic steatosis.  ASSESSMENT AND PLAN: 57 year old woman with persistent shortness of breath. She has not responded to therapies for asthma. I suspect her obesity is contributing in a significant way. However, it is difficult to explain the progressive nature of her symptoms. She is extremely limited by dyspnea and, and I think further evaluation is indicated. I have reviewed her echocardiogram and CT angiogram of the chest in detail. She does have cardiomegaly, but otherwise no clear cause of her progressive dyspnea has been identified. Will proceed with a right heart catheterization to evaluate for pulmonary hypertension as well as intracardiac shunting. I have reviewed the risks, indications, and alternatives to right heart catheterization. The patient understands and agrees to proceed. Depending on the findings at right heart cath, it would be reasonable to consider a cardiac MRI for better imaging of the right ventricle as well.  [name] 02/23/2013 3:19 PM

## 2013-02-23 NOTE — Telephone Encounter (Signed)
Follow Up  Pt returned call// Requests a call back//SR

## 2013-02-24 ENCOUNTER — Encounter (HOSPITAL_COMMUNITY): Payer: Self-pay | Admitting: Pharmacy Technician

## 2013-02-25 ENCOUNTER — Other Ambulatory Visit: Payer: Self-pay | Admitting: Cardiovascular Disease

## 2013-02-27 ENCOUNTER — Encounter (HOSPITAL_COMMUNITY)
Admission: RE | Disposition: A | Discharge status: Home / Self Care | Payer: Self-pay | Source: Ambulatory Visit | Attending: Cardiovascular Disease

## 2013-02-27 ENCOUNTER — Ambulatory Visit (HOSPITAL_COMMUNITY)
Admission: RE | Admit: 2013-02-27 | Discharge: 2013-02-27 | Disposition: A | Discharge status: Home / Self Care | Payer: BC Managed Care – PPO | Source: Ambulatory Visit | Attending: Cardiovascular Disease | Admitting: Cardiovascular Disease

## 2013-02-27 DIAGNOSIS — I10 Essential (primary) hypertension: Secondary | ICD-10-CM | POA: Insufficient documentation

## 2013-02-27 DIAGNOSIS — F3289 Other specified depressive episodes: Secondary | ICD-10-CM | POA: Insufficient documentation

## 2013-02-27 DIAGNOSIS — K219 Gastro-esophageal reflux disease without esophagitis: Secondary | ICD-10-CM | POA: Insufficient documentation

## 2013-02-27 DIAGNOSIS — R0602 Shortness of breath: Secondary | ICD-10-CM

## 2013-02-27 DIAGNOSIS — K7689 Other specified diseases of liver: Secondary | ICD-10-CM | POA: Insufficient documentation

## 2013-02-27 DIAGNOSIS — J45909 Unspecified asthma, uncomplicated: Secondary | ICD-10-CM | POA: Insufficient documentation

## 2013-02-27 DIAGNOSIS — E785 Hyperlipidemia, unspecified: Secondary | ICD-10-CM | POA: Insufficient documentation

## 2013-02-27 DIAGNOSIS — F329 Major depressive disorder, single episode, unspecified: Secondary | ICD-10-CM | POA: Insufficient documentation

## 2013-02-27 DIAGNOSIS — R062 Wheezing: Secondary | ICD-10-CM | POA: Insufficient documentation

## 2013-02-27 DIAGNOSIS — K5190 Ulcerative colitis, unspecified, without complications: Secondary | ICD-10-CM | POA: Insufficient documentation

## 2013-02-27 DIAGNOSIS — I5032 Chronic diastolic (congestive) heart failure: Secondary | ICD-10-CM | POA: Insufficient documentation

## 2013-02-27 HISTORY — PX: RIGHT HEART CATHETERIZATION: SHX5447

## 2013-02-27 LAB — POCT I-STAT 3, VENOUS BLOOD GAS (G3P V)
Acid-Base Excess: 2 mmol/L (ref 0.0–2.0)
Acid-Base Excess: 3 mmol/L — ABNORMAL HIGH (ref 0.0–2.0)
Bicarbonate: 27.6 mEq/L — ABNORMAL HIGH (ref 20.0–24.0)
Bicarbonate: 28.9 mEq/L — ABNORMAL HIGH (ref 20.0–24.0)
O2 Saturation: 65 %
O2 Saturation: 67 %
TCO2: 29 mmol/L (ref 0–100)
TCO2: 30 mmol/L (ref 0–100)
pCO2, Ven: 46.6 mmHg (ref 45.0–50.0)
pCO2, Ven: 48.8 mmHg (ref 45.0–50.0)
pH, Ven: 7.381 — ABNORMAL HIGH (ref 7.250–7.300)
pH, Ven: 7.381 — ABNORMAL HIGH (ref 7.250–7.300)
pO2, Ven: 35 mmHg (ref 30.0–45.0)
pO2, Ven: 36 mmHg (ref 30.0–45.0)

## 2013-02-27 LAB — BASIC METABOLIC PANEL
BUN: 17 mg/dL (ref 6–23)
CO2: 27 mEq/L (ref 19–32)
Calcium: 8.9 mg/dL (ref 8.4–10.5)
Chloride: 100 mEq/L (ref 96–112)
Creatinine, Ser: 0.78 mg/dL (ref 0.50–1.10)
GFR calc Af Amer: >90 mL/min (ref >90)
GFR calc non Af Amer: >90 mL/min (ref >90)
Glucose, Bld: 122 mg/dL — ABNORMAL HIGH (ref 70–99)
Potassium: 3.5 mEq/L (ref 3.5–5.1)
Sodium: 137 mEq/L (ref 135–145)

## 2013-02-27 SURGERY — RIGHT HEART CATH
Anesthesia: LOCAL

## 2013-02-27 MED ORDER — SODIUM CHLORIDE 0.9 % IV SOLN: 250.0000 mL | INTRAVENOUS | Status: DC | PRN

## 2013-02-27 MED ORDER — HEPARIN (PORCINE) IN NACL 2-0.9 UNIT/ML-% IJ SOLN
INTRAMUSCULAR | Status: AC
  Filled 2013-02-27: qty 500

## 2013-02-27 MED ORDER — SODIUM CHLORIDE 0.9 % IV SOLN
INTRAVENOUS | Status: DC
  Administered 2013-02-27: 08:00:00 via INTRAVENOUS

## 2013-02-27 MED ORDER — SODIUM CHLORIDE 0.9 % IJ SOLN: 3.0000 mL | INTRAMUSCULAR | Status: DC | PRN

## 2013-02-27 MED ORDER — ACETAMINOPHEN 325 MG PO TABS: 650.0000 mg | ORAL_TABLET | ORAL | Status: DC | PRN

## 2013-02-27 MED ORDER — SODIUM CHLORIDE 0.9 % IJ SOLN: 3.0000 mL | Freq: Two times a day (BID) | INTRAMUSCULAR | Status: DC

## 2013-02-27 MED ORDER — MIDAZOLAM HCL 2 MG/2ML IJ SOLN
INTRAMUSCULAR | Status: AC
  Filled 2013-02-27: qty 2

## 2013-02-27 MED ORDER — ASPIRIN 81 MG PO CHEW
81.0000 mg | CHEWABLE_TABLET | ORAL | Status: AC
  Administered 2013-02-27: 81 mg via ORAL
  Filled 2013-02-27: qty 1

## 2013-02-27 MED ORDER — ONDANSETRON HCL 4 MG/2ML IJ SOLN: 4.0000 mg | Freq: Four times a day (QID) | INTRAMUSCULAR | Status: DC | PRN

## 2013-02-27 MED ORDER — FENTANYL CITRATE 0.05 MG/ML IJ SOLN
INTRAMUSCULAR | Status: AC
  Filled 2013-02-27: qty 2

## 2013-02-27 NOTE — CV Procedure (Signed)
   Cardiac Catheterization Procedure Note  Name: Kathleen Hatfield MRN: [id number] DOB: [birth year]  Procedure: Right Heart Cath, RFV access under ultrasound guidance  Indication: shortness of breath, refractory to medical therapies for asthma. Eval for right heart failure.   Procedural Details: The right groin was prepped, draped, and anesthetized with 1% lidocaine. Using the modified Seldinger technique a 7 French sheath was placed in the right femoral vein using ultrasound guidance for access. A Swan-Ganz catheter was used for the right heart catheterization.  Fick cardiac output was calculated. Thermodilution cardiac output was also recorded.  There were no immediate procedural complications. The patient was transferred to the post catheterization recovery area for further monitoring.  Procedural Findings: Hemodynamics RA mean 13 RV 40/14 PA 31/16 mean 25 PCWP a wave 20, v wave 20, mean 17  Oxygen saturations: PA 66 AO 98 (by finger probe)  Cardiac Output (Fick) 6.9  Cardiac Index (Fick) 3  Cardiac Output (thermo) 5.7 Cardiac Index (thermo) 2.5  Final Conclusions:   1. Mildly elevated right-sided cardiac pressures with preserved cardiac output 2. Borderline pulmonary pressure but no evidence of significant pulmonary HTN  Would continue current diuretic regimen. Don't think there is compelling evidence of right-sided cardiac failure or pulmonary HTN.  [name] 02/27/2013, 9:24 AM

## 2013-02-27 NOTE — H&P (View-Only) (Signed)
HPI:  57 year old woman presenting for followup evaluation. The patient returns because of continued dyspnea with minimal exertion. She's been followed very closely in the pulmonary clinic over the last year. I saw her initially in April 2014 and evaluated her with an echocardiogram and nuclear stress test. There was some increase in wall thickness involving the right ventricular free wall. Other than that there were no significant findings from the echocardiogram. There is no ischemia demonstrated on her nuclear study.  The patient has had persistent dyspnea with minimal exertion. Her symptoms have not improved with aggressive pulmonary therapies. She continues to complain of shortness of breath and occasional wheezing. She has not had tightness in her chest. Leg swelling is improved on diuretics. There is a component of orthopnea but no PND.  Outpatient Encounter Prescriptions as of 02/23/2013  Medication Sig  . acetaminophen (TYLENOL) 500 MG tablet Take 500 mg by mouth every 6 (six) hours as needed for pain.  Marland Kitchen albuterol (PROVENTIL HFA;VENTOLIN HFA) 108 (90 BASE) MCG/ACT inhaler Inhale 2 puffs into the lungs every 6 (six) hours as needed for wheezing.  Marland Kitchen albuterol (PROVENTIL) (2.5 MG/3ML) 0.083% nebulizer solution Use 1 vial in nebulizer 3-4 times  . aspirin-acetaminophen-caffeine (EXCEDRIN MIGRAINE) 250-250-65 MG per tablet Take 1 tablet by mouth every 6 (six) hours as needed for pain.  . baclofen (LIORESAL) 10 MG tablet Take 10 mg by mouth 3 (three) times daily as needed.   . benzonatate (TESSALON) 200 MG capsule Take 200 mg by mouth 3 (three) times daily as needed for cough.  . calcium carbonate (TUMS - DOSED IN MG ELEMENTAL CALCIUM) 500 MG chewable tablet Chew 1 tablet by mouth as needed.   . Cholecalciferol (VITAMIN D-3) 5000 UNITS TABS Take 1 tablet by mouth daily.  . DULoxetine (CYMBALTA) 30 MG capsule Take 30 mg by mouth every evening.  . DULoxetine (CYMBALTA) 60 MG capsule Take 60 mg  by mouth every morning.   Marland Kitchen estradiol (ESTRACE) 2 MG tablet Take 1 tablet (2 mg total) by mouth daily.  . folic acid (FOLVITE) 1 MG tablet Take 1 mg by mouth 2 (two) times daily.  . furosemide (LASIX) 40 MG tablet Take 80 mg by mouth every morning.   . gabapentin (NEURONTIN) 100 MG capsule TAKE 1 CAPSULE (100 MG TOTAL) BY MOUTH 3 (THREE) TIMES DAILY.  . hydrocortisone 2.5 % cream Apply 1 application topically as needed.   Marland Kitchen levofloxacin (LEVAQUIN) 500 MG tablet Take 1 tablet (500 mg total) by mouth daily.  . Linaclotide (LINZESS) 145 MCG CAPS Take 145 mcg by mouth. Every other day  . losartan (COZAAR) 100 MG tablet Take 50 mg by mouth daily.   . pantoprazole (PROTONIX) 40 MG tablet Take 1 tablet by mouth 2 (two) times daily.   . pravastatin (PRAVACHOL) 20 MG tablet Take 1 tablet by mouth once a week.  Marland Kitchen rOPINIRole (REQUIP) 4 MG tablet Take 4 mg by mouth 2 (two) times daily.   . traMADol (ULTRAM) 50 MG tablet Take 50 mg by mouth every 6 (six) hours as needed.  . vitamin E 400 UNIT capsule Take 400 Units by mouth daily.    Allergies  Allergen Reactions  . Ceclor [Cefaclor]   . Sulfa Antibiotics     Past Medical History  Diagnosis Date  . Depression   . Hypertension   . Chronic diastolic heart failure   . Dyspnea   . Asthma   . GERD (gastroesophageal reflux disease)   . Hyperlipidemia  cannot tolerate statins  . Ulcerative colitis     dr Collene Mares    ROS: Negative except as per HPI  BP 156/83  Pulse 87  Ht 5' 5"  (1.651 m)  Wt 275 lb 3.2 oz (124.83 kg)  BMI 45.80 kg/m2  PHYSICAL EXAM: Pt is alert and oriented, NAD, she is tearful HEENT: normal Neck: JVP - normal, carotids 2+= without bruits Lungs: CTA bilaterally CV: RRR without murmur or gallop Abd: soft, NT, Positive BS, no hepatomegaly, obese Ext: 1+ pretibial edema bilaterally, distal pulses intact and equal Skin: warm/dry no rash  EKG:  Normal sinus rhythm 87 beats per minute, incomplete right bundle branch  block.  Myoview scan 06/23/2012: QPS  Raw Data Images: Normal; no motion artifact; normal heart/lung ratio.  Stress Images: Normal homogeneous uptake in all areas of the myocardium.  Rest Images: Normal homogeneous uptake in all areas of the myocardium.  Subtraction (SDS): Normal  Transient Ischemic Dilatation (Normal <1.22): 1.22  Lung/Heart Ratio (Normal <0.45): 0.40  Quantitative Gated Spect Images  QGS EDV: 74 ml  QGS ESV: 13 ml  Impression  Exercise Capacity: Lexiscan with no exercise.  BP Response: Normal blood pressure response.  Clinical Symptoms: No significant symptoms noted.  ECG Impression: No significant ST segment change suggestive of ischemia.  Comparison with Prior Nuclear Study: No images to compare  Overall Impression: Normal stress nuclear study. No evidence of ischemia.  LV Ejection Fraction: 82%. LV Wall Motion: NL LV Function; NL Wall Motion.    2-D echocardiogram 06/17/2012: ------------------------------------------------------------ Left ventricle: The cavity size was normal. Systolic function was normal. The estimated ejection fraction was in the range of 60% to 65%. Wall motion was normal; there were no regional wall motion abnormalities. Features are consistent with a pseudonormal left ventricular filling pattern, with concomitant abnormal relaxation and increased filling pressure (grade 2 diastolic dysfunction).  ------------------------------------------------------------ Aortic valve: Trileaflet; mildly thickened leaflets. Cusp separation was normal. Doppler: There was mild stenosis. No regurgitation. Peak velocity ratio of LVOT to aortic valve: 0.71. Indexed valve area: 1.09cm^2/m^2 (Vmax). Peak gradient: 75m Hg (S).  ------------------------------------------------------------ Aorta: The aorta was normal, not dilated, and non-diseased.  ------------------------------------------------------------ Mitral valve: Structurally normal valve.  Leaflet separation was normal. Doppler: Transvalvular velocity was within the normal range. There was no evidence for stenosis. Trivial regurgitation. Peak gradient: 47mHg (D).  ------------------------------------------------------------ Left atrium: The atrium was mildly dilated.  ------------------------------------------------------------ Right ventricle: The cavity size was normal. Wall thickness was moderately increased. There was moderate hypertrophy. Systolic function was normal.  ------------------------------------------------------------ Pulmonic valve: Poorly visualized. Doppler: Trivial regurgitation.  ------------------------------------------------------------ Tricuspid valve: Structurally normal valve. Leaflet separation was normal. Doppler: Transvalvular velocity was within the normal range. Trivial regurgitation.  ------------------------------------------------------------ Right atrium: The atrium was normal in size.  ------------------------------------------------------------ Pericardium: There was no pericardial effusion.  ------------------------------------------------------------ Systemic veins: Inferior vena cava: Not visualized.  ------------------------------------------------------------ Post procedure conclusions Ascending Aorta:  - The aorta was normal, not dilated, and non-diseased.  CT angiogram of the chest 02/10/2013: FINDINGS: Good contrast bolus timing in the pulmonary arterial tree.  No focal filling defect identified in the pulmonary arterial tree to suggest the presence of acute pulmonary embolism. Main pulmonary artery caliber is about 31 mm diameter, versus about 35 mm of the adjacent ascending aorta.  Major airways are patent. Cardiomegaly. Lingula atelectasis. Mild dependent atelectasis elsewhere. There is some mosaic attenuation in the lungs, mostly near the hila.  No pericardial or pleural effusion. Negative visualized  aorta. Negative thoracic inlet. No mediastinal or  axillary lymphadenopathy.  Decreased density in the liver in keeping with steatosis. Otherwise negative visualized upper abdominal viscera.  No acute osseous abnormality identified.  Review of the MIP images confirms the above findings.  IMPRESSION: 1. No evidence of acute pulmonary embolus. No pneumonia.  2. Some mosaic attenuation in the lungs, such as due to chronic small airway disease. Lingula atelectasis.  3. Cardiomegaly. No pericardial or pleural effusion.  4. Hepatic steatosis.  ASSESSMENT AND PLAN: 57 year old woman with persistent shortness of breath. She has not responded to therapies for asthma. I suspect her obesity is contributing in a significant way. However, it is difficult to explain the progressive nature of her symptoms. She is extremely limited by dyspnea and, and I think further evaluation is indicated. I have reviewed her echocardiogram and CT angiogram of the chest in detail. She does have cardiomegaly, but otherwise no clear cause of her progressive dyspnea has been identified. Will proceed with a right heart catheterization to evaluate for pulmonary hypertension as well as intracardiac shunting. I have reviewed the risks, indications, and alternatives to right heart catheterization. The patient understands and agrees to proceed. Depending on the findings at right heart cath, it would be reasonable to consider a cardiac MRI for better imaging of the right ventricle as well.  [name] 02/23/2013 3:19 PM

## 2013-02-27 NOTE — Interval H&P Note (Signed)
History and Physical Interval Note:  02/27/2013 8:51 AM  Kathleen Hatfield  has presented today for surgery, with the diagnosis of SOB  The various methods of treatment have been discussed with the patient and family. After consideration of risks, benefits and other options for treatment, the patient has consented to  Procedure(s): RIGHT HEART CATH (N/A) as a surgical intervention .  The patient's history has been reviewed, patient examined, no change in status, stable for surgery.  I have reviewed the patient's chart and labs.  Questions were answered to the patient's satisfaction.     [name]

## 2013-03-06 ENCOUNTER — Telehealth: Payer: Self-pay | Admitting: Critical Care Medicine

## 2013-03-06 MED ORDER — AZITHROMYCIN 250 MG PO TABS: ORAL_TABLET | ORAL | Status: DC

## 2013-03-06 MED ORDER — PREDNISONE 10 MG PO TABS: ORAL_TABLET | ORAL | Status: DC

## 2013-03-06 NOTE — Telephone Encounter (Signed)
Prednisone 10 mg take  4 each am x 2 days,   2 each am x 2 days,  1 each am x 2 days and stop zpak

## 2013-03-06 NOTE — Telephone Encounter (Signed)
Pt aware of recs and rx sent. Nothing further needed 

## 2013-03-06 NOTE — Telephone Encounter (Signed)
I called and spoke with pt. She c/o chest congestion, cough w/ yellow phlem, chest tx, wheezing, chills, unknown if having fever, slight body aches (but reports he body always hurts) x christmas. She has been using neb meds QID, tussin dm, and sudafed. PW is not on schedule and no appts available. Please advise MW thanks  Allergies  Allergen Reactions  . Almond Oil Anaphylaxis  . Ceclor [Cefaclor]   . Levaquin [Levofloxacin] Other (See Comments)    Body aches  . Sulfa Antibiotics     Current Outpatient Prescriptions on File Prior to Visit  Medication Sig Dispense Refill  . acetaminophen (TYLENOL) 500 MG tablet Take 500 mg by mouth every 6 (six) hours as needed for pain.      Marland Kitchen albuterol (PROVENTIL HFA;VENTOLIN HFA) 108 (90 BASE) MCG/ACT inhaler Inhale 2 puffs into the lungs every 6 (six) hours as needed for wheezing.      Marland Kitchen albuterol (PROVENTIL) (2.5 MG/3ML) 0.083% nebulizer solution Take 2.5 mg by nebulization 4 (four) times daily.      Marland Kitchen aspirin-acetaminophen-caffeine (EXCEDRIN MIGRAINE) 250-250-65 MG per tablet Take 1 tablet by mouth every 6 (six) hours as needed for pain.      . benzonatate (TESSALON) 200 MG capsule Take 200 mg by mouth 3 (three) times daily as needed for cough.      . calcium carbonate (TUMS - DOSED IN MG ELEMENTAL CALCIUM) 500 MG chewable tablet Chew 1 tablet by mouth daily as needed for heartburn.       . chlorpheniramine (CHLOR-TRIMETON) 4 MG tablet Take 4 mg by mouth daily as needed for allergies.      . Cholecalciferol (VITAMIN D-3) 5000 UNITS TABS Take 5,000 Units by mouth daily.       . DULoxetine (CYMBALTA) 30 MG capsule Take 30 mg by mouth every evening.      . DULoxetine (CYMBALTA) 60 MG capsule Take 60 mg by mouth every morning.       Marland Kitchen estradiol (ESTRACE) 2 MG tablet Take 1 tablet (2 mg total) by mouth daily.  90 tablet  3  . folic acid (FOLVITE) 1 MG tablet Take 1 mg by mouth 2 (two) times daily.      . furosemide (LASIX) 40 MG tablet Take 80 mg by mouth  every morning.       . gabapentin (NEURONTIN) 100 MG capsule Take 300 mg by mouth at bedtime.      . hydrocortisone 2.5 % cream Apply 1 application topically daily as needed (for itching).       . Linaclotide (LINZESS) 145 MCG CAPS Take 145 mcg by mouth every other day.       . losartan (COZAAR) 100 MG tablet Take 50 mg by mouth daily.       . pantoprazole (PROTONIX) 40 MG tablet Take 40 mg by mouth 2 (two) times daily.       . potassium chloride SA (K-DUR,KLOR-CON) 20 MEQ tablet Take 1 tablet (20 mEq total) by mouth daily.  30 tablet  6  . pravastatin (PRAVACHOL) 20 MG tablet Take 20 mg by mouth once a week. On Friday      . pseudoephedrine (SUDAFED) 120 MG 12 hr tablet Take 120 mg by mouth daily as needed for congestion.      Marland Kitchen rOPINIRole (REQUIP) 4 MG tablet Take 4 mg by mouth 2 (two) times daily.       . vitamin E 400 UNIT capsule Take 400 Units by mouth daily.  No current facility-administered medications on file prior to visit.

## 2013-03-13 ENCOUNTER — Ambulatory Visit: Payer: BC Managed Care – PPO | Admitting: Critical Care Medicine

## 2013-03-15 ENCOUNTER — Ambulatory Visit (INDEPENDENT_AMBULATORY_CARE_PROVIDER_SITE_OTHER): Payer: BC Managed Care – PPO | Admitting: Critical Care Medicine

## 2013-03-15 ENCOUNTER — Encounter: Payer: Self-pay | Admitting: Critical Care Medicine

## 2013-03-15 VITALS — BP 156/88 | HR 74 | Temp 98.5°F | Ht 65.25 in | Wt 280.0 lb

## 2013-03-15 DIAGNOSIS — R0609 Other forms of dyspnea: Secondary | ICD-10-CM

## 2013-03-15 DIAGNOSIS — R0989 Other specified symptoms and signs involving the circulatory and respiratory systems: Secondary | ICD-10-CM

## 2013-03-15 DIAGNOSIS — R0689 Other abnormalities of breathing: Secondary | ICD-10-CM

## 2013-03-15 DIAGNOSIS — J984 Other disorders of lung: Secondary | ICD-10-CM

## 2013-03-15 DIAGNOSIS — R0600 Dyspnea, unspecified: Secondary | ICD-10-CM

## 2013-03-15 NOTE — Patient Instructions (Signed)
A second opinion for you shortness of breath will be obtained at [facility] Stay on nebulizer for now Use cpap as you currently are using I am happy to see you again as needed or in follow up after [facility] appointment

## 2013-03-15 NOTE — Progress Notes (Deleted)
Subjective:    Patient ID: Kathleen Hatfield, female    DOB: Aug 17, 1955, 58 y.o.   MRN: [id number]  HPI  Subjective:    Patient ID: Kathleen Hatfield, female    DOB: Nov 22, 1955, 58 y.o.   MRN: [id number]  HPI  02/08/2013 Chief Complaint  Patient presents with  . Acute Visit    Increased SOB with activity x 1 wk, some wheezing, chest tightness/soreness, and nonprod cough.  No f/c/s.  Spiriva and Asmanex haven't helped.  Pt more dyspneic and wheezing and unimproved with spiriva/asmanex of much use.  The patient has no real cough or mucus production. Systemic steroids have not been of benefit. The patient notes ongoing wheezing and chest tightness. The cough is dry. She has not had any improvement with a variety of HFA and dry powdered devices.  03/15/2013 Chief Complaint  Patient presents with  . 1 month follow up    Breathing has worsened over the past month.  Has increased SOB with little activity, nonprod cough, chest tightness, wheezing, and feels feverish.     Past Medical History  Diagnosis Date  . Depression   . Hypertension   . Chronic diastolic heart failure   . Dyspnea   . Asthma   . GERD (gastroesophageal reflux disease)   . Hyperlipidemia     cannot tolerate statins  . Ulcerative colitis     dr Collene Mares     Family History  Problem Relation Age of Onset  . Heart disease Mother   . Hypertension Mother   . Dementia Mother   . Heart disease Father   . COPD Father   . Hypertension Father      History   Social History  . Marital Status: Single    Spouse Name: N/A    Number of Children: N/A  . Years of Education: N/A   Occupational History  . unemployed    Social History Main Topics  . Smoking status: Never Smoker   . Smokeless tobacco: Not on file  . Alcohol Use: No  . Drug Use: No  . Sexual Activity: Not on file   Other Topics Concern  . Not on file   Social History Narrative  . No narrative on file     Allergies  Allergen Reactions  . Almond Oil  Anaphylaxis  . Ceclor [Cefaclor]   . Levaquin [Levofloxacin] Other (See Comments)    Body aches  . Sulfa Antibiotics      Outpatient Prescriptions Prior to Visit  Medication Sig Dispense Refill  . acetaminophen (TYLENOL) 500 MG tablet Take 500 mg by mouth every 6 (six) hours as needed for pain.      Marland Kitchen albuterol (PROVENTIL HFA;VENTOLIN HFA) 108 (90 BASE) MCG/ACT inhaler Inhale 2 puffs into the lungs every 6 (six) hours as needed for wheezing.      Marland Kitchen albuterol (PROVENTIL) (2.5 MG/3ML) 0.083% nebulizer solution Take 2.5 mg by nebulization 4 (four) times daily.      Marland Kitchen aspirin-acetaminophen-caffeine (EXCEDRIN MIGRAINE) 250-250-65 MG per tablet Take 1 tablet by mouth every 6 (six) hours as needed for pain.      . calcium carbonate (TUMS - DOSED IN MG ELEMENTAL CALCIUM) 500 MG chewable tablet Chew 1 tablet by mouth daily as needed for heartburn.       . chlorpheniramine (CHLOR-TRIMETON) 4 MG tablet Take 4 mg by mouth daily as needed for allergies.      . Cholecalciferol (VITAMIN D-3) 5000 UNITS TABS Take 5,000 Units by mouth  daily.       . DULoxetine (CYMBALTA) 30 MG capsule Take 30 mg by mouth every evening.      . DULoxetine (CYMBALTA) 60 MG capsule Take 60 mg by mouth every morning.       Marland Kitchen estradiol (ESTRACE) 2 MG tablet Take 1 tablet (2 mg total) by mouth daily.  90 tablet  3  . folic acid (FOLVITE) 1 MG tablet Take 1 mg by mouth 2 (two) times daily.      . furosemide (LASIX) 40 MG tablet Take 80 mg by mouth every morning.       . gabapentin (NEURONTIN) 100 MG capsule Take 300 mg by mouth at bedtime.      . hydrocortisone 2.5 % cream Apply 1 application topically daily as needed (for itching).       . Linaclotide (LINZESS) 145 MCG CAPS Take 145 mcg by mouth every other day.       . losartan (COZAAR) 100 MG tablet Take 50 mg by mouth daily.       . pantoprazole (PROTONIX) 40 MG tablet Take 40 mg by mouth 2 (two) times daily.       . potassium chloride SA (K-DUR,KLOR-CON) 20 MEQ tablet Take 1  tablet (20 mEq total) by mouth daily.  30 tablet  6  . pravastatin (PRAVACHOL) 20 MG tablet Take 20 mg by mouth once a week. On Friday      . pseudoephedrine (SUDAFED) 120 MG 12 hr tablet Take 120 mg by mouth daily as needed for congestion.      Marland Kitchen rOPINIRole (REQUIP) 4 MG tablet Take 4 mg by mouth 2 (two) times daily.       . vitamin E 400 UNIT capsule Take 400 Units by mouth daily.      . benzonatate (TESSALON) 200 MG capsule Take 200 mg by mouth 3 (three) times daily as needed for cough.      Marland Kitchen azithromycin (ZITHROMAX) 250 MG tablet Take as directed  6 tablet  0  . predniSONE (DELTASONE) 10 MG tablet Take 4 tabs daily x 2 days, 2 tabs daily x 2 days, 1 tab daily x 2 days then stop  14 tablet  0   No facility-administered medications prior to visit.       Review of Systems  Constitutional: Positive for chills, diaphoresis, activity change, appetite change, fatigue and unexpected weight change.  HENT: Positive for facial swelling, tinnitus and trouble swallowing. Negative for congestion, dental problem, ear discharge, hearing loss, mouth sores, nosebleeds, postnasal drip, sinus pressure, sneezing and voice change.   Eyes: Negative for photophobia, discharge, itching and visual disturbance.  Respiratory: Positive for cough and chest tightness. Negative for apnea, choking and stridor.   Cardiovascular: Positive for palpitations.  Gastrointestinal: Positive for nausea, constipation and abdominal distention. Negative for blood in stool.  Genitourinary: Negative for dysuria, urgency, frequency, hematuria, flank pain, decreased urine volume and difficulty urinating.  Musculoskeletal: Positive for arthralgias and myalgias. Negative for back pain, gait problem, joint swelling and neck stiffness.  Skin: Positive for color change. Negative for pallor.  Neurological: Positive for light-headedness. Negative for dizziness, tremors, seizures, syncope, speech difficulty, weakness and numbness.   Hematological: Negative for adenopathy. Does not bruise/bleed easily.  Psychiatric/Behavioral: Negative for confusion, sleep disturbance and agitation. The patient is not nervous/anxious.        Objective:   Physical Exam  Filed Vitals:   03/15/13 1159  BP: 156/88  Pulse: 74  Temp: 98.5 F (36.9 C)  TempSrc: Oral  Height: 5' 5.25" (1.657 m)  Weight: 280 lb (127.007 kg)  SpO2: 96%    Gen: Pleasant, obese , in no distress,  normal affect  ENT: No lesions,  mouth clear,  oropharynx clear, no postnasal drip  Neck: No JVD, no TMG, no carotid bruits  Lungs: No use of accessory muscles, no dullness to percussion, clear without rales or rhonchi, expired wheezes noted  Cardiovascular: RRR, heart sounds normal, no murmur or gallops, 1+  peripheral edema  Abdomen: soft and NT, no HSM,  BS normal  Musculoskeletal: No deformities, no cyanosis or clubbing  Neuro: alert, non focal  Skin: Warm, no lesions or rashes  No results found. Spirometry 02/08/2013: FEV1 57% predicted FVC 71% predicted FEV1 to FVC ratio 64% predicted     Assessment & Plan:   No problem-specific assessment & plan notes found for this encounter.   Updated Medication List Outpatient Encounter Prescriptions as of 03/15/2013  Medication Sig  . acetaminophen (TYLENOL) 500 MG tablet Take 500 mg by mouth every 6 (six) hours as needed for pain.  Marland Kitchen albuterol (PROVENTIL HFA;VENTOLIN HFA) 108 (90 BASE) MCG/ACT inhaler Inhale 2 puffs into the lungs every 6 (six) hours as needed for wheezing.  Marland Kitchen albuterol (PROVENTIL) (2.5 MG/3ML) 0.083% nebulizer solution Take 2.5 mg by nebulization 4 (four) times daily.  Marland Kitchen aspirin-acetaminophen-caffeine (EXCEDRIN MIGRAINE) 250-250-65 MG per tablet Take 1 tablet by mouth every 6 (six) hours as needed for pain.  . calcium carbonate (TUMS - DOSED IN MG ELEMENTAL CALCIUM) 500 MG chewable tablet Chew 1 tablet by mouth daily as needed for heartburn.   . chlorpheniramine (CHLOR-TRIMETON) 4  MG tablet Take 4 mg by mouth daily as needed for allergies.  . Cholecalciferol (VITAMIN D-3) 5000 UNITS TABS Take 5,000 Units by mouth daily.   . DULoxetine (CYMBALTA) 30 MG capsule Take 30 mg by mouth every evening.  . DULoxetine (CYMBALTA) 60 MG capsule Take 60 mg by mouth every morning.   Marland Kitchen estradiol (ESTRACE) 2 MG tablet Take 1 tablet (2 mg total) by mouth daily.  . folic acid (FOLVITE) 1 MG tablet Take 1 mg by mouth 2 (two) times daily.  . furosemide (LASIX) 40 MG tablet Take 80 mg by mouth every morning.   . gabapentin (NEURONTIN) 100 MG capsule Take 300 mg by mouth at bedtime.  . hydrocortisone 2.5 % cream Apply 1 application topically daily as needed (for itching).   . Linaclotide (LINZESS) 145 MCG CAPS Take 145 mcg by mouth every other day.   . losartan (COZAAR) 100 MG tablet Take 50 mg by mouth daily.   . pantoprazole (PROTONIX) 40 MG tablet Take 40 mg by mouth 2 (two) times daily.   . potassium chloride SA (K-DUR,KLOR-CON) 20 MEQ tablet Take 1 tablet (20 mEq total) by mouth daily.  . pravastatin (PRAVACHOL) 20 MG tablet Take 20 mg by mouth once a week. On Friday  . pseudoephedrine (SUDAFED) 120 MG 12 hr tablet Take 120 mg by mouth daily as needed for congestion.  Marland Kitchen rOPINIRole (REQUIP) 4 MG tablet Take 4 mg by mouth 2 (two) times daily.   . vitamin E 400 UNIT capsule Take 400 Units by mouth daily.  . benzonatate (TESSALON) 200 MG capsule Take 200 mg by mouth 3 (three) times daily as needed for cough.  . [DISCONTINUED] azithromycin (ZITHROMAX) 250 MG tablet Take as directed  . [DISCONTINUED] predniSONE (DELTASONE) 10 MG tablet Take 4 tabs daily x 2 days, 2 tabs daily x 2 days,  1 tab daily x 2 days then stop        Review of Systems     Objective:   Physical Exam        Assessment & Plan:

## 2013-03-16 NOTE — Progress Notes (Signed)
Patient ID: Kathleen Hatfield, female   DOB: August 12, 1955, 58 y.o.   MRN: [id number]  Subjective:    Patient ID: Kathleen Hatfield, female    DOB: 1955/12/20, 58 y.o.   MRN: [id number]  HPI 02/08/2013 Chief Complaint  Patient presents with  . Acute Visit    Increased SOB with activity x 1 wk, some wheezing, chest tightness/soreness, and nonprod cough.  No f/c/s.  Spiriva and Asmanex haven't helped.  Pt more dyspneic and wheezing and unimproved with spiriva/asmanex of much use.  The patient has no real cough or mucus production. Systemic steroids have not been of benefit. The patient notes ongoing wheezing and chest tightness. The cough is dry. She has not had any improvement with a variety of HFA and dry powdered devices.  03/15/2013 Chief Complaint  Patient presents with  . 1 month follow up    Breathing has worsened over the past month.  Has increased SOB with little activity, nonprod cough, chest tightness, wheezing, and feels feverish.  Pt unchanged. No improvement with albuterol neb med.   Pt denies any significant sore throat, nasal congestion or excess secretions, fever, chills, sweats, unintended weight loss, pleurtic or exertional chest pain, orthopnea PND, or leg swelling Pt denies any increase in rescue therapy over baseline, denies waking up needing it or having any early am or nocturnal exacerbations of coughing/wheezing/or dyspnea. Pt also denies any obvious fluctuation in symptoms with  weather or environmental change or other alleviating or aggravating factors  RHC normal.    Past Medical History  Diagnosis Date  . Depression   . Hypertension   . Chronic diastolic heart failure   . Dyspnea   . Asthma   . GERD (gastroesophageal reflux disease)   . Hyperlipidemia     cannot tolerate statins  . Ulcerative colitis     dr Collene Mares     Family History  Problem Relation Age of Onset  . Heart disease Mother   . Hypertension Mother   . Dementia Mother   . Heart disease Father   . COPD  Father   . Hypertension Father      History   Social History  . Marital Status: Single    Spouse Name: N/A    Number of Children: N/A  . Years of Education: N/A   Occupational History  . unemployed    Social History Main Topics  . Smoking status: Never Smoker   . Smokeless tobacco: Not on file  . Alcohol Use: No  . Drug Use: No  . Sexual Activity: Not on file   Other Topics Concern  . Not on file   Social History Narrative  . No narrative on file     Allergies  Allergen Reactions  . Almond Oil Anaphylaxis  . Ceclor [Cefaclor]   . Levaquin [Levofloxacin] Other (See Comments)    Body aches  . Sulfa Antibiotics      Outpatient Prescriptions Prior to Visit  Medication Sig Dispense Refill  . acetaminophen (TYLENOL) 500 MG tablet Take 500 mg by mouth every 6 (six) hours as needed for pain.      Marland Kitchen albuterol (PROVENTIL HFA;VENTOLIN HFA) 108 (90 BASE) MCG/ACT inhaler Inhale 2 puffs into the lungs every 6 (six) hours as needed for wheezing.      Marland Kitchen albuterol (PROVENTIL) (2.5 MG/3ML) 0.083% nebulizer solution Take 2.5 mg by nebulization 4 (four) times daily.      Marland Kitchen aspirin-acetaminophen-caffeine (EXCEDRIN MIGRAINE) 250-250-65 MG per tablet Take 1 tablet by mouth  every 6 (six) hours as needed for pain.      . calcium carbonate (TUMS - DOSED IN MG ELEMENTAL CALCIUM) 500 MG chewable tablet Chew 1 tablet by mouth daily as needed for heartburn.       . chlorpheniramine (CHLOR-TRIMETON) 4 MG tablet Take 4 mg by mouth daily as needed for allergies.      . Cholecalciferol (VITAMIN D-3) 5000 UNITS TABS Take 5,000 Units by mouth daily.       . DULoxetine (CYMBALTA) 30 MG capsule Take 30 mg by mouth every evening.      . DULoxetine (CYMBALTA) 60 MG capsule Take 60 mg by mouth every morning.       Marland Kitchen estradiol (ESTRACE) 2 MG tablet Take 1 tablet (2 mg total) by mouth daily.  90 tablet  3  . folic acid (FOLVITE) 1 MG tablet Take 1 mg by mouth 2 (two) times daily.      . furosemide (LASIX) 40  MG tablet Take 80 mg by mouth every morning.       . gabapentin (NEURONTIN) 100 MG capsule Take 300 mg by mouth at bedtime.      . hydrocortisone 2.5 % cream Apply 1 application topically daily as needed (for itching).       . Linaclotide (LINZESS) 145 MCG CAPS Take 145 mcg by mouth every other day.       . losartan (COZAAR) 100 MG tablet Take 50 mg by mouth daily.       . pantoprazole (PROTONIX) 40 MG tablet Take 40 mg by mouth 2 (two) times daily.       . potassium chloride SA (K-DUR,KLOR-CON) 20 MEQ tablet Take 1 tablet (20 mEq total) by mouth daily.  30 tablet  6  . pravastatin (PRAVACHOL) 20 MG tablet Take 20 mg by mouth once a week. On Friday      . pseudoephedrine (SUDAFED) 120 MG 12 hr tablet Take 120 mg by mouth daily as needed for congestion.      Marland Kitchen rOPINIRole (REQUIP) 4 MG tablet Take 4 mg by mouth 2 (two) times daily.       . vitamin E 400 UNIT capsule Take 400 Units by mouth daily.      . benzonatate (TESSALON) 200 MG capsule Take 200 mg by mouth 3 (three) times daily as needed for cough.      Marland Kitchen azithromycin (ZITHROMAX) 250 MG tablet Take as directed  6 tablet  0  . predniSONE (DELTASONE) 10 MG tablet Take 4 tabs daily x 2 days, 2 tabs daily x 2 days, 1 tab daily x 2 days then stop  14 tablet  0   No facility-administered medications prior to visit.       Review of Systems  Constitutional: Positive for chills, diaphoresis, activity change, appetite change, fatigue and unexpected weight change.  HENT: Positive for facial swelling, tinnitus and trouble swallowing. Negative for congestion, dental problem, ear discharge, hearing loss, mouth sores, nosebleeds, postnasal drip, sinus pressure, sneezing and voice change.   Eyes: Negative for photophobia, discharge, itching and visual disturbance.  Respiratory: Positive for cough and chest tightness. Negative for apnea, choking and stridor.   Cardiovascular: Positive for palpitations.  Gastrointestinal: Positive for nausea,  constipation and abdominal distention. Negative for blood in stool.  Genitourinary: Negative for dysuria, urgency, frequency, hematuria, flank pain, decreased urine volume and difficulty urinating.  Musculoskeletal: Positive for arthralgias and myalgias. Negative for back pain, gait problem, joint swelling and neck stiffness.  Skin: Positive  for color change. Negative for pallor.  Neurological: Positive for light-headedness. Negative for dizziness, tremors, seizures, syncope, speech difficulty, weakness and numbness.  Hematological: Negative for adenopathy. Does not bruise/bleed easily.  Psychiatric/Behavioral: Negative for confusion, sleep disturbance and agitation. The patient is not nervous/anxious.        Objective:   Physical Exam Filed Vitals:   03/15/13 1159  BP: 156/88  Pulse: 74  Temp: 98.5 F (36.9 C)  TempSrc: Oral  Height: 5' 5.25" (1.657 m)  Weight: 127.007 kg (280 lb)  SpO2: 96%    Gen: Pleasant, obese , in no distress,  normal affect  ENT: No lesions,  mouth clear,  oropharynx clear, no postnasal drip  Neck: No JVD, no TMG, no carotid bruits  Lungs: No use of accessory muscles, no dullness to percussion, clear without rales or rhonchi, expired wheezes noted  Cardiovascular: RRR, heart sounds normal, no murmur or gallops, 1+  peripheral edema  Abdomen: soft and NT, no HSM,  BS normal  Musculoskeletal: No deformities, no cyanosis or clubbing  Neuro: alert, non focal  Skin: Warm, no lesions or rashes  No results found. 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 No biopsy done.  No ILD. No pulm HTN. Severe Sleep apnea. Plan A second opinion will be obtained at [facility] Stay on nebulizer for now with albuterol Note multiple HFA and DPI inhalers tried and failed: Dulera, symbicort, spiriva,         Updated Medication List Outpatient Encounter Prescriptions as of 03/15/2013  Medication Sig  . acetaminophen (TYLENOL) 500 MG tablet Take 500 mg by mouth every 6 (six) hours as needed for pain.  Marland Kitchen albuterol (PROVENTIL HFA;VENTOLIN HFA) 108 (90 BASE) MCG/ACT inhaler Inhale 2 puffs into the lungs every 6 (six) hours as needed for wheezing.  Marland Kitchen albuterol (PROVENTIL) (2.5 MG/3ML) 0.083% nebulizer solution Take 2.5 mg by nebulization 4 (four) times daily.  Marland Kitchen aspirin-acetaminophen-caffeine (EXCEDRIN MIGRAINE) 250-250-65 MG per tablet Take 1 tablet by mouth every 6 (six) hours as needed for pain.  . calcium carbonate (TUMS - DOSED IN MG ELEMENTAL CALCIUM) 500 MG chewable tablet Chew 1 tablet by mouth daily as needed for heartburn.   . chlorpheniramine (CHLOR-TRIMETON) 4 MG tablet Take 4 mg by mouth daily as needed for allergies.  . Cholecalciferol (VITAMIN D-3) 5000 UNITS TABS Take 5,000 Units by mouth daily.   . DULoxetine (CYMBALTA) 30 MG capsule Take 30 mg by mouth every evening.  . DULoxetine (CYMBALTA) 60 MG capsule Take 60 mg by mouth every morning.   Marland Kitchen estradiol (ESTRACE) 2 MG tablet Take 1 tablet (2 mg total) by mouth daily.  . folic acid (FOLVITE) 1 MG tablet Take 1 mg by mouth 2 (two) times daily.  . furosemide (LASIX) 40 MG tablet Take 80 mg by mouth every morning.   . gabapentin (NEURONTIN) 100 MG capsule Take 300 mg by mouth at bedtime.  . hydrocortisone 2.5 % cream Apply 1 application topically daily as needed (for itching).   . Linaclotide (LINZESS) 145 MCG CAPS Take 145 mcg by mouth every other day.   . losartan (COZAAR) 100 MG tablet Take 50 mg by mouth daily.   . pantoprazole (PROTONIX) 40 MG tablet Take 40 mg by mouth 2 (two) times daily.   . potassium chloride SA (K-DUR,KLOR-CON) 20 MEQ tablet Take 1 tablet (  20 mEq total) by mouth daily.  . pravastatin (PRAVACHOL) 20 MG tablet Take 20 mg by mouth once a week. On Friday  . pseudoephedrine (SUDAFED) 120 MG 12 hr tablet  Take 120 mg by mouth daily as needed for congestion.  Marland Kitchen rOPINIRole (REQUIP) 4 MG tablet Take 4 mg by mouth 2 (two) times daily.   . vitamin E 400 UNIT capsule Take 400 Units by mouth daily.  . benzonatate (TESSALON) 200 MG capsule Take 200 mg by mouth 3 (three) times daily as needed for cough.  . [DISCONTINUED] azithromycin (ZITHROMAX) 250 MG tablet Take as directed  . [DISCONTINUED] predniSONE (DELTASONE) 10 MG tablet Take 4 tabs daily x 2 days, 2 tabs daily x 2 days, 1 tab daily x 2 days then stop

## 2013-03-16 NOTE — Assessment & Plan Note (Signed)
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 No biopsy done.  No ILD. No pulm HTN. Severe Sleep apnea. Plan A second opinion will be obtained at [facility] Stay on nebulizer for now with albuterol Note multiple HFA and DPI inhalers tried and failed: Dulera, symbicort, spiriva,

## 2013-03-22 ENCOUNTER — Telehealth: Payer: Self-pay | Admitting: Critical Care Medicine

## 2013-03-22 MED ORDER — DOXYCYCLINE HYCLATE 100 MG PO TABS: 100.0000 mg | ORAL_TABLET | Freq: Two times a day (BID) | ORAL | Status: DC

## 2013-03-22 NOTE — Telephone Encounter (Signed)
Called and spoke with pt.Aware of recs. RX sent. Nothing further needed

## 2013-03-22 NOTE — Telephone Encounter (Signed)
Ok to send in doxycycline 169m bid for 5 days.

## 2013-03-22 NOTE — Telephone Encounter (Signed)
Pt requesting abx. C/o body aches, prod cough-green phlem, chills/sweats, PND, nasal congestion, increase SOB at rest, wheezing and chest tx x 3 days. Not sure if she has fever. She is taking OTC robitussin DM and using nebs QID. PW is admin this afternoon and have been told to send to DOD when he is scheduled in admin. Please advise [place] thanks Pt saw PW 03/15/13 Allergies  Allergen Reactions  . Almond Oil Anaphylaxis  . Ceclor [Cefaclor]   . Levaquin [Levofloxacin] Other (See Comments)    Body aches  . Sulfa Antibiotics     Current Outpatient Prescriptions on File Prior to Visit  Medication Sig Dispense Refill  . acetaminophen (TYLENOL) 500 MG tablet Take 500 mg by mouth every 6 (six) hours as needed for pain.      Marland Kitchen albuterol (PROVENTIL HFA;VENTOLIN HFA) 108 (90 BASE) MCG/ACT inhaler Inhale 2 puffs into the lungs every 6 (six) hours as needed for wheezing.      Marland Kitchen albuterol (PROVENTIL) (2.5 MG/3ML) 0.083% nebulizer solution Take 2.5 mg by nebulization 4 (four) times daily.      Marland Kitchen aspirin-acetaminophen-caffeine (EXCEDRIN MIGRAINE) 250-250-65 MG per tablet Take 1 tablet by mouth every 6 (six) hours as needed for pain.      . benzonatate (TESSALON) 200 MG capsule Take 200 mg by mouth 3 (three) times daily as needed for cough.      . calcium carbonate (TUMS - DOSED IN MG ELEMENTAL CALCIUM) 500 MG chewable tablet Chew 1 tablet by mouth daily as needed for heartburn.       . chlorpheniramine (CHLOR-TRIMETON) 4 MG tablet Take 4 mg by mouth daily as needed for allergies.      . Cholecalciferol (VITAMIN D-3) 5000 UNITS TABS Take 5,000 Units by mouth daily.       . DULoxetine (CYMBALTA) 30 MG capsule Take 30 mg by mouth every evening.      . DULoxetine (CYMBALTA) 60 MG capsule Take 60 mg by mouth every morning.       Marland Kitchen estradiol (ESTRACE) 2 MG tablet Take 1 tablet (2 mg total) by mouth daily.  90 tablet  3  . folic acid (FOLVITE) 1 MG tablet Take 1 mg by mouth 2 (two) times daily.      . furosemide  (LASIX) 40 MG tablet Take 80 mg by mouth every morning.       . gabapentin (NEURONTIN) 100 MG capsule Take 300 mg by mouth at bedtime.      . hydrocortisone 2.5 % cream Apply 1 application topically daily as needed (for itching).       . Linaclotide (LINZESS) 145 MCG CAPS Take 145 mcg by mouth every other day.       . losartan (COZAAR) 100 MG tablet Take 50 mg by mouth daily.       . pantoprazole (PROTONIX) 40 MG tablet Take 40 mg by mouth 2 (two) times daily.       . potassium chloride SA (K-DUR,KLOR-CON) 20 MEQ tablet Take 1 tablet (20 mEq total) by mouth daily.  30 tablet  6  . pravastatin (PRAVACHOL) 20 MG tablet Take 20 mg by mouth once a week. On Friday      . pseudoephedrine (SUDAFED) 120 MG 12 hr tablet Take 120 mg by mouth daily as needed for congestion.      Marland Kitchen rOPINIRole (REQUIP) 4 MG tablet Take 4 mg by mouth 2 (two) times daily.       . vitamin E 400 UNIT capsule  Take 400 Units by mouth daily.       No current facility-administered medications on file prior to visit.

## 2013-03-24 ENCOUNTER — Encounter (HOSPITAL_COMMUNITY): Payer: Self-pay | Admitting: Emergency Medicine

## 2013-03-24 ENCOUNTER — Observation Stay (HOSPITAL_COMMUNITY)
Admission: EM | Admit: 2013-03-24 | Discharge: 2013-03-27 | Disposition: A | Discharge status: Home / Self Care | Payer: BC Managed Care – PPO | Attending: Internal Medicine | Admitting: Internal Medicine

## 2013-03-24 ENCOUNTER — Emergency Department (HOSPITAL_COMMUNITY): Payer: BC Managed Care – PPO

## 2013-03-24 DIAGNOSIS — F3289 Other specified depressive episodes: Secondary | ICD-10-CM | POA: Insufficient documentation

## 2013-03-24 DIAGNOSIS — I5032 Chronic diastolic (congestive) heart failure: Secondary | ICD-10-CM | POA: Insufficient documentation

## 2013-03-24 DIAGNOSIS — I509 Heart failure, unspecified: Secondary | ICD-10-CM | POA: Insufficient documentation

## 2013-03-24 DIAGNOSIS — R0989 Other specified symptoms and signs involving the circulatory and respiratory systems: Secondary | ICD-10-CM | POA: Insufficient documentation

## 2013-03-24 DIAGNOSIS — K219 Gastro-esophageal reflux disease without esophagitis: Secondary | ICD-10-CM | POA: Diagnosis present

## 2013-03-24 DIAGNOSIS — G4733 Obstructive sleep apnea (adult) (pediatric): Secondary | ICD-10-CM | POA: Diagnosis present

## 2013-03-24 DIAGNOSIS — K5190 Ulcerative colitis, unspecified, without complications: Secondary | ICD-10-CM | POA: Insufficient documentation

## 2013-03-24 DIAGNOSIS — E785 Hyperlipidemia, unspecified: Secondary | ICD-10-CM | POA: Diagnosis present

## 2013-03-24 DIAGNOSIS — R05 Cough: Secondary | ICD-10-CM | POA: Insufficient documentation

## 2013-03-24 DIAGNOSIS — R509 Fever, unspecified: Secondary | ICD-10-CM | POA: Insufficient documentation

## 2013-03-24 DIAGNOSIS — I517 Cardiomegaly: Secondary | ICD-10-CM | POA: Insufficient documentation

## 2013-03-24 DIAGNOSIS — J45909 Unspecified asthma, uncomplicated: Secondary | ICD-10-CM | POA: Insufficient documentation

## 2013-03-24 DIAGNOSIS — R059 Cough, unspecified: Secondary | ICD-10-CM | POA: Insufficient documentation

## 2013-03-24 DIAGNOSIS — J811 Chronic pulmonary edema: Secondary | ICD-10-CM | POA: Insufficient documentation

## 2013-03-24 DIAGNOSIS — R0600 Dyspnea, unspecified: Secondary | ICD-10-CM

## 2013-03-24 DIAGNOSIS — F329 Major depressive disorder, single episode, unspecified: Secondary | ICD-10-CM | POA: Insufficient documentation

## 2013-03-24 DIAGNOSIS — I10 Essential (primary) hypertension: Secondary | ICD-10-CM | POA: Diagnosis present

## 2013-03-24 DIAGNOSIS — I5030 Unspecified diastolic (congestive) heart failure: Secondary | ICD-10-CM | POA: Diagnosis present

## 2013-03-24 DIAGNOSIS — F32A Depression, unspecified: Secondary | ICD-10-CM | POA: Diagnosis present

## 2013-03-24 DIAGNOSIS — R0602 Shortness of breath: Secondary | ICD-10-CM | POA: Insufficient documentation

## 2013-03-24 DIAGNOSIS — J45901 Unspecified asthma with (acute) exacerbation: Principal | ICD-10-CM | POA: Diagnosis present

## 2013-03-24 DIAGNOSIS — R0609 Other forms of dyspnea: Secondary | ICD-10-CM | POA: Insufficient documentation

## 2013-03-24 LAB — CBC WITH DIFFERENTIAL/PLATELET
Basophils Absolute: 0 10*3/uL (ref 0.0–0.1)
Basophils Relative: 0 % (ref 0–1)
Eosinophils Absolute: 0.2 10*3/uL (ref 0.0–0.7)
Eosinophils Relative: 2 % (ref 0–5)
HCT: 31.7 % — ABNORMAL LOW (ref 36.0–46.0)
Hemoglobin: 9.9 g/dL — ABNORMAL LOW (ref 12.0–15.0)
Lymphocytes Relative: 17 % (ref 12–46)
Lymphs Abs: 1.8 10*3/uL (ref 0.7–4.0)
MCH: 23.4 pg — ABNORMAL LOW (ref 26.0–34.0)
MCHC: 31.2 g/dL (ref 30.0–36.0)
MCV: 74.9 fL — ABNORMAL LOW (ref 78.0–100.0)
Monocytes Absolute: 1.1 10*3/uL — ABNORMAL HIGH (ref 0.1–1.0)
Monocytes Relative: 10 % (ref 3–12)
Neutro Abs: 7.4 10*3/uL (ref 1.7–7.7)
Neutrophils Relative %: 70 % (ref 43–77)
Platelets: 416 10*3/uL — ABNORMAL HIGH (ref 150–400)
RBC: 4.23 MIL/uL (ref 3.87–5.11)
RDW: 17.5 % — ABNORMAL HIGH (ref 11.5–15.5)
WBC: 10.6 10*3/uL — ABNORMAL HIGH (ref 4.0–10.5)

## 2013-03-24 LAB — INFLUENZA PANEL BY PCR (TYPE A & B)
H1N1 flu by pcr: NOT DETECTED
Influenza A By PCR: NEGATIVE
Influenza B By PCR: NEGATIVE

## 2013-03-24 LAB — HEPATIC FUNCTION PANEL
ALT: 23 U/L (ref 0–35)
AST: 31 U/L (ref 0–37)
Albumin: 3.4 g/dL — ABNORMAL LOW (ref 3.5–5.2)
Alkaline Phosphatase: 93 U/L (ref 39–117)
Bilirubin, Direct: <0.2 mg/dL (ref 0.0–0.3)
Total Bilirubin: 0.2 mg/dL — ABNORMAL LOW (ref 0.3–1.2)
Total Protein: 7.8 g/dL (ref 6.0–8.3)

## 2013-03-24 LAB — BASIC METABOLIC PANEL
BUN: 11 mg/dL (ref 6–23)
CO2: 28 mEq/L (ref 19–32)
Calcium: 9.2 mg/dL (ref 8.4–10.5)
Chloride: 98 mEq/L (ref 96–112)
Creatinine, Ser: 0.81 mg/dL (ref 0.50–1.10)
GFR calc Af Amer: >90 mL/min (ref >90)
GFR calc non Af Amer: 79 mL/min — ABNORMAL LOW (ref >90)
Glucose, Bld: 151 mg/dL — ABNORMAL HIGH (ref 70–99)
Potassium: 3.3 mEq/L — ABNORMAL LOW (ref 3.7–5.3)
Sodium: 140 mEq/L (ref 137–147)

## 2013-03-24 LAB — PRO B NATRIURETIC PEPTIDE: Pro B Natriuretic peptide (BNP): 159.8 pg/mL — ABNORMAL HIGH (ref 0–125)

## 2013-03-24 LAB — PROTIME-INR
INR: 0.95 (ref 0.00–1.49)
Prothrombin Time: 12.5 seconds (ref 11.6–15.2)

## 2013-03-24 MED ORDER — VITAMIN D-3 125 MCG (5000 UT) PO TABS: 5000.0000 [IU] | ORAL_TABLET | Freq: Every day | ORAL | Status: DC

## 2013-03-24 MED ORDER — LORAZEPAM 2 MG/ML IJ SOLN
0.5000 mg | Freq: Once | INTRAMUSCULAR | Status: AC
  Administered 2013-03-24: 0.5 mg via INTRAVENOUS
  Filled 2013-03-24: qty 1

## 2013-03-24 MED ORDER — LOSARTAN POTASSIUM 50 MG PO TABS: 50.0000 mg | ORAL_TABLET | Freq: Every day | ORAL | Status: DC

## 2013-03-24 MED ORDER — DOXYCYCLINE HYCLATE 100 MG PO TABS
100.0000 mg | ORAL_TABLET | Freq: Two times a day (BID) | ORAL | Status: DC
  Administered 2013-03-24 – 2013-03-27 (×6): 100 mg via ORAL
  Filled 2013-03-24 (×8): qty 1

## 2013-03-24 MED ORDER — SODIUM CHLORIDE 0.9 % IJ SOLN
3.0000 mL | INTRAMUSCULAR | Status: DC | PRN
  Administered 2013-03-25: 3 mL via INTRAVENOUS

## 2013-03-24 MED ORDER — POTASSIUM CHLORIDE CRYS ER 20 MEQ PO TBCR
40.0000 meq | EXTENDED_RELEASE_TABLET | Freq: Once | ORAL | Status: AC
  Administered 2013-03-24: 40 meq via ORAL
  Filled 2013-03-24: qty 2

## 2013-03-24 MED ORDER — ALBUTEROL SULFATE (2.5 MG/3ML) 0.083% IN NEBU
2.5000 mg | INHALATION_SOLUTION | RESPIRATORY_TRACT | Status: DC
  Administered 2013-03-24: 2.5 mg via RESPIRATORY_TRACT
  Filled 2013-03-24 (×3): qty 3

## 2013-03-24 MED ORDER — VITAMIN D3 25 MCG (1000 UNIT) PO TABS
5000.0000 [IU] | ORAL_TABLET | Freq: Every day | ORAL | Status: DC
  Administered 2013-03-24 – 2013-03-27 (×4): 5000 [IU] via ORAL
  Filled 2013-03-24 (×4): qty 5

## 2013-03-24 MED ORDER — ROPINIROLE HCL 1 MG PO TABS
4.0000 mg | ORAL_TABLET | Freq: Two times a day (BID) | ORAL | Status: DC
  Administered 2013-03-24 – 2013-03-27 (×6): 4 mg via ORAL
  Filled 2013-03-24 (×7): qty 4

## 2013-03-24 MED ORDER — GUAIFENESIN-DM 100-10 MG/5ML PO SYRP
5.0000 mL | ORAL_SOLUTION | ORAL | Status: DC | PRN
  Administered 2013-03-24 – 2013-03-25 (×2): 5 mL via ORAL
  Filled 2013-03-24 (×2): qty 5

## 2013-03-24 MED ORDER — SODIUM CHLORIDE 0.9 % IJ SOLN: 3.0000 mL | Freq: Two times a day (BID) | INTRAMUSCULAR | Status: DC

## 2013-03-24 MED ORDER — ALBUTEROL (5 MG/ML) CONTINUOUS INHALATION SOLN
10.0000 mg/h | INHALATION_SOLUTION | Freq: Once | RESPIRATORY_TRACT | Status: AC
  Administered 2013-03-24: 10 mg/h via RESPIRATORY_TRACT
  Filled 2013-03-24: qty 20

## 2013-03-24 MED ORDER — METHYLPREDNISOLONE SODIUM SUCC 125 MG IJ SOLR
125.0000 mg | Freq: Once | INTRAMUSCULAR | Status: AC
  Administered 2013-03-24: 125 mg via INTRAVENOUS
  Filled 2013-03-24: qty 2

## 2013-03-24 MED ORDER — POTASSIUM CHLORIDE CRYS ER 20 MEQ PO TBCR
20.0000 meq | EXTENDED_RELEASE_TABLET | Freq: Every day | ORAL | Status: DC
  Administered 2013-03-25 – 2013-03-27 (×3): 20 meq via ORAL
  Filled 2013-03-24 (×3): qty 1

## 2013-03-24 MED ORDER — ASPIRIN-ACETAMINOPHEN-CAFFEINE 250-250-65 MG PO TABS
1.0000 | ORAL_TABLET | Freq: Every day | ORAL | Status: DC | PRN
  Filled 2013-03-24: qty 1

## 2013-03-24 MED ORDER — HEPARIN SODIUM (PORCINE) 5000 UNIT/ML IJ SOLN
5000.0000 [IU] | Freq: Three times a day (TID) | INTRAMUSCULAR | Status: DC
  Administered 2013-03-24 – 2013-03-27 (×8): 5000 [IU] via SUBCUTANEOUS
  Filled 2013-03-24 (×11): qty 1

## 2013-03-24 MED ORDER — PSEUDOEPHEDRINE HCL ER 120 MG PO TB12
120.0000 mg | ORAL_TABLET | Freq: Every day | ORAL | Status: DC | PRN
  Filled 2013-03-24: qty 1

## 2013-03-24 MED ORDER — LORAZEPAM 2 MG/ML IJ SOLN: 0.5000 mg | Freq: Once | INTRAMUSCULAR | Status: AC

## 2013-03-24 MED ORDER — VITAMIN E 180 MG (400 UNIT) PO CAPS
400.0000 [IU] | ORAL_CAPSULE | Freq: Every day | ORAL | Status: DC
  Administered 2013-03-24 – 2013-03-27 (×4): 400 [IU] via ORAL
  Filled 2013-03-24 (×4): qty 1

## 2013-03-24 MED ORDER — IPRATROPIUM BROMIDE 0.02 % IN SOLN
0.5000 mg | RESPIRATORY_TRACT | Status: DC
  Administered 2013-03-24: 0.5 mg via RESPIRATORY_TRACT
  Filled 2013-03-24 (×3): qty 2.5

## 2013-03-24 MED ORDER — PANTOPRAZOLE SODIUM 40 MG PO TBEC
40.0000 mg | DELAYED_RELEASE_TABLET | Freq: Two times a day (BID) | ORAL | Status: DC
  Administered 2013-03-24 – 2013-03-27 (×6): 40 mg via ORAL
  Filled 2013-03-24 (×5): qty 1

## 2013-03-24 MED ORDER — GABAPENTIN 300 MG PO CAPS
300.0000 mg | ORAL_CAPSULE | Freq: Every day | ORAL | Status: DC
  Administered 2013-03-24 – 2013-03-26 (×3): 300 mg via ORAL
  Filled 2013-03-24 (×4): qty 1

## 2013-03-24 MED ORDER — FUROSEMIDE 80 MG PO TABS
80.0000 mg | ORAL_TABLET | Freq: Every morning | ORAL | Status: DC
  Administered 2013-03-25 – 2013-03-27 (×3): 80 mg via ORAL
  Filled 2013-03-24 (×3): qty 1

## 2013-03-24 MED ORDER — ESTRADIOL 2 MG PO TABS
2.0000 mg | ORAL_TABLET | Freq: Every day | ORAL | Status: DC
  Administered 2013-03-24 – 2013-03-27 (×4): 2 mg via ORAL
  Filled 2013-03-24 (×4): qty 1

## 2013-03-24 MED ORDER — FOLIC ACID 1 MG PO TABS
1.0000 mg | ORAL_TABLET | Freq: Two times a day (BID) | ORAL | Status: DC
  Administered 2013-03-24 – 2013-03-27 (×6): 1 mg via ORAL
  Filled 2013-03-24 (×7): qty 1

## 2013-03-24 MED ORDER — SODIUM CHLORIDE 0.9 % IJ SOLN
3.0000 mL | Freq: Two times a day (BID) | INTRAMUSCULAR | Status: DC
  Administered 2013-03-24 (×2): 3 mL via INTRAVENOUS

## 2013-03-24 MED ORDER — PRAVASTATIN SODIUM 20 MG PO TABS
20.0000 mg | ORAL_TABLET | ORAL | Status: DC
  Administered 2013-03-24: 20 mg via ORAL
  Filled 2013-03-24: qty 1

## 2013-03-24 MED ORDER — IPRATROPIUM-ALBUTEROL 0.5-2.5 (3) MG/3ML IN SOLN
3.0000 mL | RESPIRATORY_TRACT | Status: DC
  Administered 2013-03-24 – 2013-03-27 (×18): 3 mL via RESPIRATORY_TRACT
  Filled 2013-03-24 (×14): qty 3

## 2013-03-24 MED ORDER — ROPINIROLE HCL 1 MG PO TABS: 4.0000 mg | ORAL_TABLET | Freq: Two times a day (BID) | ORAL | Status: DC

## 2013-03-24 MED ORDER — METHYLPREDNISOLONE SODIUM SUCC 125 MG IJ SOLR
80.0000 mg | Freq: Two times a day (BID) | INTRAMUSCULAR | Status: DC
  Administered 2013-03-24: 80 mg via INTRAVENOUS
  Filled 2013-03-24 (×3): qty 1.28

## 2013-03-24 MED ORDER — SODIUM CHLORIDE 0.9 % IV SOLN
250.0000 mL | INTRAVENOUS | Status: DC | PRN
Start: 2013-03-24 — End: 2013-03-25

## 2013-03-24 MED ORDER — DIPHENHYDRAMINE HCL 25 MG PO TABS
25.0000 mg | ORAL_TABLET | Freq: Four times a day (QID) | ORAL | Status: DC | PRN
  Filled 2013-03-24: qty 1

## 2013-03-24 MED ORDER — LINACLOTIDE 145 MCG PO CAPS
145.0000 ug | ORAL_CAPSULE | ORAL | Status: DC
  Administered 2013-03-25 – 2013-03-27 (×2): 145 ug via ORAL
  Filled 2013-03-24 (×2): qty 1

## 2013-03-24 MED ORDER — DULOXETINE HCL 60 MG PO CPEP
60.0000 mg | ORAL_CAPSULE | Freq: Every morning | ORAL | Status: DC
  Administered 2013-03-25 – 2013-03-27 (×3): 60 mg via ORAL
  Filled 2013-03-24 (×3): qty 1

## 2013-03-24 MED ORDER — LOSARTAN POTASSIUM 50 MG PO TABS
50.0000 mg | ORAL_TABLET | Freq: Every day | ORAL | Status: DC
  Administered 2013-03-24 – 2013-03-26 (×3): 50 mg via ORAL
  Filled 2013-03-24 (×4): qty 1

## 2013-03-24 MED ORDER — DULOXETINE HCL 30 MG PO CPEP
30.0000 mg | ORAL_CAPSULE | Freq: Every evening | ORAL | Status: DC
  Administered 2013-03-24 – 2013-03-26 (×3): 30 mg via ORAL
  Filled 2013-03-24 (×6): qty 1

## 2013-03-24 MED ORDER — SIMVASTATIN 10 MG PO TABS
10.0000 mg | ORAL_TABLET | Freq: Every day | ORAL | Status: DC
Start: 2013-03-24 — End: 2013-03-24

## 2013-03-24 MED ORDER — ALBUTEROL SULFATE (2.5 MG/3ML) 0.083% IN NEBU: 2.5000 mg | INHALATION_SOLUTION | RESPIRATORY_TRACT | Status: DC | PRN

## 2013-03-24 MED ORDER — ACETAMINOPHEN 500 MG PO TABS
500.0000 mg | ORAL_TABLET | Freq: Four times a day (QID) | ORAL | Status: DC | PRN
  Administered 2013-03-24 – 2013-03-27 (×3): 500 mg via ORAL
  Filled 2013-03-24 (×3): qty 1

## 2013-03-24 NOTE — ED Notes (Signed)
Productive cough, fever, sob off and on since Christmas.  Symptoms subsided but returned around the 03/20/12.  Followed Dr. Joya Gaskins, was seen at [facility] today.  Sent here via EMS for respiratory distress and suspected pneumonia.  Prescribed doxycycline by Dr. Joya Gaskins this week, has only taken 2 doses.  Currently oxygenating at 96% on RA.

## 2013-03-24 NOTE — ED Provider Notes (Signed)
CSN: [id number]     Arrival date & time 03/24/13  0944 History   First MD Initiated Contact with Patient 03/24/13 [id number]     Chief Complaint  Patient presents with  . Cough  . Fever   (Consider location/radiation/quality/duration/timing/severity/associated sxs/prior Treatment) HPI Comments: Patient is a 57 year old female with history of asthma and CHF who presents today for 3 days of gradually worsening shortness of breath. She has associated productive cough which is bad all the time. She reports having fever of 100F measured orally this morning. She takes her breathing treatments about 4 times a day and uses her inhaler approximately every 2 hours. This has not improved her symptoms. She began doxycycline yesterday. This was prescribed by her pulmonologist. She sees a pulmonologist for constricted bronchioles. 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 She is generally very uncomfortable currently. She wears a CPAP at night.   Patient is a 58 y.o. female presenting with cough and fever. The history is provided by the patient. No language interpreter was used.  Cough Associated symptoms: fever, rhinorrhea and shortness of breath   Associated symptoms: no ear pain   Fever Associated symptoms: cough, rhinorrhea and vomiting (post tussive)   Associated symptoms: no ear pain     Past Medical History  Diagnosis Date  . Depression   . Hypertension   . Chronic diastolic heart failure   . Dyspnea   . Asthma   . GERD (gastroesophageal reflux disease)   . Hyperlipidemia     cannot tolerate statins  . Ulcerative colitis     dr Collene Mares   Past Surgical History  Procedure Laterality Date  . Abdominal hysterectomy    . Cholecystectomy    . Cesarean section    . Appendectomy    . Sigmoidectomy  2010    diverticular disease   Family History  Problem Relation Age of Onset  . Heart disease Mother   . Hypertension Mother   . Dementia Mother   . Heart disease  Father   . COPD Father   . Hypertension Father    History  Substance Use Topics  . Smoking status: Never Smoker   . Smokeless tobacco: Not on file  . Alcohol Use: No   OB History   Grav Para Term Preterm Abortions TAB SAB Ect Mult Living                 Review of Systems  Constitutional: Positive for fever and fatigue.  HENT: Positive for rhinorrhea. Negative for ear pain.   Respiratory: Positive for cough, chest tightness and shortness of breath.   Gastrointestinal: Positive for vomiting (post tussive). Negative for abdominal pain.  All other systems reviewed and are negative.    Allergies  Almond oil; Ceclor; Levaquin; and Sulfa antibiotics  Home Medications   Current Outpatient Rx  Name  Route  Sig  Dispense  Refill  . acetaminophen (TYLENOL) 500 MG tablet   Oral   Take 500 mg by mouth every 6 (six) hours as needed for pain.         Marland Kitchen albuterol (PROVENTIL HFA;VENTOLIN HFA) 108 (90 BASE) MCG/ACT inhaler   Inhalation   Inhale 2 puffs into the lungs every 6 (six) hours as needed for wheezing.         Marland Kitchen albuterol (PROVENTIL) (2.5 MG/3ML) 0.083% nebulizer solution   Nebulization   Take 2.5 mg by nebulization 4 (four) times daily.         Marland Kitchen  aspirin-acetaminophen-caffeine (EXCEDRIN MIGRAINE) 250-250-65 MG per tablet   Oral   Take 1 tablet by mouth every 6 (six) hours as needed for pain.         . benzonatate (TESSALON) 200 MG capsule   Oral   Take 200 mg by mouth 3 (three) times daily as needed for cough.         . calcium carbonate (TUMS - DOSED IN MG ELEMENTAL CALCIUM) 500 MG chewable tablet   Oral   Chew 1 tablet by mouth daily as needed for heartburn.          . chlorpheniramine (CHLOR-TRIMETON) 4 MG tablet   Oral   Take 4 mg by mouth daily as needed for allergies.         . Cholecalciferol (VITAMIN D-3) 5000 UNITS TABS   Oral   Take 5,000 Units by mouth daily.          Marland Kitchen doxycycline (VIBRA-TABS) 100 MG tablet   Oral   Take 1 tablet  (100 mg total) by mouth 2 (two) times daily.   10 tablet   0   . DULoxetine (CYMBALTA) 30 MG capsule   Oral   Take 30 mg by mouth every evening.         . DULoxetine (CYMBALTA) 60 MG capsule   Oral   Take 60 mg by mouth every morning.          Marland Kitchen estradiol (ESTRACE) 2 MG tablet   Oral   Take 1 tablet (2 mg total) by mouth daily.   90 tablet   3   . folic acid (FOLVITE) 1 MG tablet   Oral   Take 1 mg by mouth 2 (two) times daily.         . furosemide (LASIX) 40 MG tablet   Oral   Take 80 mg by mouth every morning.          . gabapentin (NEURONTIN) 100 MG capsule   Oral   Take 300 mg by mouth at bedtime.         . hydrocortisone 2.5 % cream   Topical   Apply 1 application topically daily as needed (for itching).          . Linaclotide (LINZESS) 145 MCG CAPS   Oral   Take 145 mcg by mouth every other day.          . losartan (COZAAR) 100 MG tablet   Oral   Take 50 mg by mouth daily.          . pantoprazole (PROTONIX) 40 MG tablet   Oral   Take 40 mg by mouth 2 (two) times daily.          . potassium chloride SA (K-DUR,KLOR-CON) 20 MEQ tablet   Oral   Take 1 tablet (20 mEq total) by mouth daily.   30 tablet   6   . pravastatin (PRAVACHOL) 20 MG tablet   Oral   Take 20 mg by mouth once a week. On Friday         . pseudoephedrine (SUDAFED) 120 MG 12 hr tablet   Oral   Take 120 mg by mouth daily as needed for congestion.         Marland Kitchen rOPINIRole (REQUIP) 4 MG tablet   Oral   Take 4 mg by mouth 2 (two) times daily.          . vitamin E 400 UNIT capsule   Oral   Take 400  Units by mouth daily.          BP 156/76  Pulse 85  Temp(Src) 98.6 F (37 C) (Oral)  Resp 18  Ht 5' 5"  (1.651 m)  Wt 275 lb (124.739 kg)  BMI 45.76 kg/m2  SpO2 95% Physical Exam  Nursing note and vitals reviewed. Constitutional: She is oriented to person, place, and time. She appears well-developed and well-nourished. No distress.  HENT:  Head:  Normocephalic and atraumatic.  Right Ear: External ear normal.  Left Ear: External ear normal.  Nose: Nose normal.  Mouth/Throat: Oropharynx is clear and moist.  Eyes: Conjunctivae are normal.  Neck: Normal range of motion.  Cardiovascular: Normal rate, regular rhythm and normal heart sounds.   Pulmonary/Chest: No stridor. Tachypnea noted. No respiratory distress. She has wheezes. She has rhonchi. She has no rales.  Abdominal: Soft. She exhibits no distension.  Musculoskeletal: Normal range of motion.  Neurological: She is alert and oriented to person, place, and time. She has normal strength.  Skin: Skin is warm and dry. She is not diaphoretic. No erythema.  Psychiatric: She has a normal mood and affect. Her behavior is normal.    ED Course  Procedures (including critical care time) Labs Review Labs Reviewed  CBC WITH DIFFERENTIAL - Abnormal; Notable for the following:    WBC 10.6 (*)    Hemoglobin 9.9 (*)    HCT 31.7 (*)    MCV 74.9 (*)    MCH 23.4 (*)    RDW 17.5 (*)    Platelets 416 (*)    Monocytes Absolute 1.1 (*)    All other components within normal limits  BASIC METABOLIC PANEL - Abnormal; Notable for the following:    Potassium 3.3 (*)    Glucose, Bld 151 (*)    GFR calc non Af Amer 79 (*)    All other components within normal limits  PRO B NATRIURETIC PEPTIDE - Abnormal; Notable for the following:    Pro B Natriuretic peptide (BNP) 159.8 (*)    All other components within normal limits  CULTURE, BLOOD (ROUTINE X 2)  CULTURE, BLOOD (ROUTINE X 2)  INFLUENZA PANEL BY PCR (TYPE A & B, H1N1)   Imaging Review Dg Chest 2 View  03/24/2013   CLINICAL DATA:  Cough and fever  EXAM: CHEST  2 VIEW  COMPARISON:  02/08/2013  FINDINGS: Resolution of lingular infiltrate. Negative for pneumonia on today's study.  Cardiac enlargement with vascular congestion. This has progressed from the prior study. There is no effusion.  IMPRESSION: Resolution of lingular infiltrate  Pulmonary  vascular congestion suggesting mild fluid overload.   Electronically Signed   By: Franchot Gallo M.D.   On: 03/24/2013 11:09    EKG Interpretation    Date/Time:  Friday March 24 2013 09:47:35 EST Ventricular Rate:  87 PR Interval:  184 QRS Duration: 112 QT Interval:  400 QTC Calculation: 481 R Axis:   46 Text Interpretation:  Age not entered, assumed to be  58 years old for purpose of ECG interpretation Sinus rhythm Incomplete right bundle branch block Low voltage, precordial leads Borderline prolonged QT interval Baseline wander in lead(s) V1 No significant change since last tracing Confirmed by GOLDSTON  MD, SCOTT ([id number]) on 03/24/2013 10:55:56 AM            MDM   1. Asthma   2. Chronic diastolic heart failure    Patient presents to ED with 3 days of gradually worsening shortness of breath. No PNA seen on  chest XR. BNP 159.8. EKG without acute abnormality. Pt with only mild improvement after hour long nebulizer treatment and solumedrol. She will need medial admission. Blood cultures pending and influenza PCR pending. Vital signs stable at this time. Patient admitted to internal medicine teaching service. Admission is appreciated. Discussed case with Dr. Regenia Skeeter who agrees with plan. Patient / Family / Caregiver informed of clinical course, understand medical decision-making process, and agree with plan.   Medications  albuterol (PROVENTIL,VENTOLIN) solution continuous neb (10 mg/hr Nebulization Given 03/24/13 1017)  methylPREDNISolone sodium succinate (SOLU-MEDROL) 125 mg/2 mL injection 125 mg (125 mg Intravenous Given 03/24/13 1059)  LORazepam (ATIVAN) injection 0.5 mg (0.5 mg Intravenous Given 03/24/13 1202)  LORazepam (ATIVAN) injection 0.5 mg (0 mg Intravenous Duplicate 4/65/68 1275)     Elwyn Lade, PA-C 03/24/13 1255

## 2013-03-24 NOTE — H&P (Signed)
Hospital Admission Note Date: 03/24/2013  Patient name: Kathleen Hatfield Medical record number: [id number] Date of birth: 12/20/1955 Age: 58 y.o. Gender: female PCP: Ann Held, MD  Medical Service: IMTS  Attending physician: Dr. Marinda Elk   Internal Medicine Teaching Service Contact Information  Weekday Hours (7AM-5PM):   1st contact: Dr. Mechele Claude Pgr: [phone]  2nd contact: Dr. Jerene Pitch pgr: [phone]  ** If no return call within 15 minutes (after trying both pagers listed above), please call after hours pagers.   After 5 pm or weekends: 1st Contact: Pager: [phone] 2nd Contact: Pager: [phone]  Chief Complaint: Productive cough, shortness of breath and wheezing.  History of Present Illness:  Patient is a 58 year old lady with past medical history for congestive heart failure (, asthma, hypertension, hyperlipidemia, obstructive sleep apnea, who presents with cough, shortness of breath and wheezing.   Patient reports that she started having cold-like symptoms 3 weeks ago, including fever, chills,  runny nose, sore throat, sneezing and cough. Symptoms have been progressively getting worse. She started feeling shortness of breath and cough up yellow or greenish sputum. She vomited one or two times because of severe coughing. She saw her pulmonologist, Dr. Joya Gaskins last week. She was given prescription for doxycycline and continued home inhalers. Patient did not start taking doxycycline until yesterday. She took 2 pills of doxycycline yesterday without significant help, therefore she comes to ED for further evaluation. Currently patient still has productive cough, shortness breath, but no any chest pain. She was found to have mild leukocytosis with WBC 10.6 and pulmonary vascular congestion on CXR.   ROS:  Has fever and chill,  fatigue, cough, SOB and constipation. No chest pain, abdominal pain, diarrhea, dysuria, urgency, frequency, hematuria, joint pain or leg swelling.  Meds: Current  Outpatient Rx  Name  Route  Sig  Dispense  Refill  . acetaminophen (TYLENOL) 500 MG tablet   Oral   Take 500 mg by mouth every 4 (four) hours as needed.          Marland Kitchen albuterol (PROVENTIL HFA;VENTOLIN HFA) 108 (90 BASE) MCG/ACT inhaler   Inhalation   Inhale 2 puffs into the lungs daily as needed for wheezing.          Marland Kitchen albuterol (PROVENTIL) (2.5 MG/3ML) 0.083% nebulizer solution   Nebulization   Take 2.5 mg by nebulization 4 (four) times daily.         Marland Kitchen aspirin-acetaminophen-caffeine (EXCEDRIN MIGRAINE) 250-250-65 MG per tablet   Oral   Take 1 tablet by mouth daily as needed for headache.          . chlorpheniramine (CHLOR-TRIMETON) 4 MG tablet   Oral   Take 4 mg by mouth daily as needed for allergies.         . Cholecalciferol (VITAMIN D-3) 5000 UNITS TABS   Oral   Take 5,000 Units by mouth daily.          Marland Kitchen doxycycline (VIBRA-TABS) 100 MG tablet   Oral   Take 1 tablet (100 mg total) by mouth 2 (two) times daily.   10 tablet   0   . DULoxetine (CYMBALTA) 30 MG capsule   Oral   Take 30 mg by mouth every evening.         . DULoxetine (CYMBALTA) 60 MG capsule   Oral   Take 60 mg by mouth every morning.          Marland Kitchen estradiol (ESTRACE) 2 MG tablet   Oral   Take 1 tablet (  2 mg total) by mouth daily.   90 tablet   3   . folic acid (FOLVITE) 1 MG tablet   Oral   Take 1 mg by mouth 2 (two) times daily.         . furosemide (LASIX) 40 MG tablet   Oral   Take 80 mg by mouth every morning.          . gabapentin (NEURONTIN) 100 MG capsule   Oral   Take 300 mg by mouth at bedtime.         . Linaclotide (LINZESS) 145 MCG CAPS   Oral   Take 145 mcg by mouth every other day.          . losartan (COZAAR) 100 MG tablet   Oral   Take 50 mg by mouth at bedtime.          . pantoprazole (PROTONIX) 40 MG tablet   Oral   Take 40 mg by mouth 2 (two) times daily.          . pravastatin (PRAVACHOL) 20 MG tablet   Oral   Take 20 mg by mouth once a  week. On Friday         . pseudoephedrine (SUDAFED) 120 MG 12 hr tablet   Oral   Take 120 mg by mouth daily as needed for congestion.         Marland Kitchen rOPINIRole (REQUIP) 4 MG tablet   Oral   Take 4 mg by mouth 2 (two) times daily.          . vitamin E 400 UNIT capsule   Oral   Take 400 Units by mouth daily.         . potassium chloride SA (K-DUR,KLOR-CON) 20 MEQ tablet   Oral   Take 1 tablet (20 mEq total) by mouth daily.   30 tablet   6     Allergies: Allergies as of 03/24/2013 - Review Complete 03/24/2013  Allergen Reaction Noted  . Almond oil Anaphylaxis 02/24/2013  . Ceclor [cefaclor] Nausea And Vomiting 05/25/2011  . Levaquin [levofloxacin] Other (See Comments) 02/24/2013  . Sulfa antibiotics Nausea And Vomiting 05/25/2011   Past Medical History  Diagnosis Date  . Depression   . Hypertension   . Chronic diastolic heart failure   . Dyspnea   . Asthma   . GERD (gastroesophageal reflux disease)   . Hyperlipidemia     cannot tolerate statins  . Ulcerative colitis     dr Collene Mares   Past Surgical History  Procedure Laterality Date  . Abdominal hysterectomy    . Cholecystectomy    . Cesarean section    . Appendectomy    . Sigmoidectomy  2010    diverticular disease   Family History  Problem Relation Age of Onset  . Heart disease Mother   . Hypertension Mother   . Dementia Mother   . Heart disease Father   . COPD Father   . Hypertension Father    History   Social History  . Marital Status: Single    Spouse Name: N/A    Number of Children: N/A  . Years of Education: N/A   Occupational History  . unemployed    Social History Main Topics  . Smoking status: Never Smoker   . Smokeless tobacco: Not on file  . Alcohol Use: No  . Drug Use: No  . Sexual Activity: Not on file   Other Topics Concern  . Not on file  Social History Narrative  .  Denies smoking, alcohol or using drugs. No recent long distance traveling     Review of Systems: Full  14-point review of systems otherwise negative except as noted above in HPI.  Physical Exam:   Filed Vitals:   03/24/13 1015 03/24/13 1019 03/24/13 1100 03/24/13 1203  BP: 145/62  142/64 152/81  Pulse: 84  92 98  Temp:      TempSrc:      Resp: 19  17 20   Height:      Weight:      SpO2: 93% 95% 94% 94%    General: Not in acute distress HEENT: PERRL, EOMI, no scleral icterus, No JVD or bruit Cardiac: S1/S2, RRR, No murmurs, gallops or rubs Pulm: Mildly decreased air movement bilaterally. Has expiratory wheezing bilaterally, No rales or rubs. Abd: Soft, nondistended, nontender, no rebound pain, no organomegaly, BS present Ext: No edema. 2+DP/PT pulse bilaterally Musculoskeletal: No joint deformities, erythema, or stiffness, ROM full Skin: No rashes.  Neuro: Alert and oriented X3, cranial nerves II-XII grossly intact, muscle strength 5/5 in all extremeties, sensation to light touch intact.  Psych: Patient is not psychotic, no suicidal or hemocidal ideation.  Lab results: Basic Metabolic Panel:  Recent Labs  03/24/13 1019  NA 140  K 3.3*  CL 98  CO2 28  GLUCOSE 151*  BUN 11  CREATININE 0.81  CALCIUM 9.2   Liver Function Tests: No results found for this basename: AST, ALT, ALKPHOS, BILITOT, PROT, ALBUMIN,  in the last 72 hours No results found for this basename: LIPASE, AMYLASE,  in the last 72 hours No results found for this basename: AMMONIA,  in the last 72 hours CBC:  Recent Labs  03/24/13 1019  WBC 10.6*  NEUTROABS 7.4  HGB 9.9*  HCT 31.7*  MCV 74.9*  PLT 416*   Cardiac Enzymes: No results found for this basename: CKTOTAL, CKMB, CKMBINDEX, TROPONINI,  in the last 72 hours BNP:  Recent Labs  03/24/13 1019  PROBNP 159.8*   D-Dimer: No results found for this basename: DDIMER,  in the last 72 hours CBG: No results found for this basename: GLUCAP,  in the last 72 hours Hemoglobin A1C: No results found for this basename: HGBA1C,  in the last 72  hours Fasting Lipid Panel: No results found for this basename: CHOL, HDL, LDLCALC, TRIG, CHOLHDL, LDLDIRECT,  in the last 72 hours Thyroid Function Tests: No results found for this basename: TSH, T4TOTAL, FREET4, T3FREE, THYROIDAB,  in the last 72 hours Anemia Panel: No results found for this basename: VITAMINB12, FOLATE, FERRITIN, TIBC, IRON, RETICCTPCT,  in the last 72 hours Coagulation: No results found for this basename: LABPROT, INR,  in the last 72 hours Urine Drug Screen: Drugs of Abuse  No results found for this basename: labopia, cocainscrnur, labbenz, amphetmu, thcu, labbarb    Alcohol Level: No results found for this basename: ETH,  in the last 72 hours Urinalysis: No results found for this basename: COLORURINE, APPERANCEUR, LABSPEC, PHURINE, GLUCOSEU, HGBUR, BILIRUBINUR, KETONESUR, PROTEINUR, UROBILINOGEN, NITRITE, LEUKOCYTESUR,  in the last 72 hours Misc. Labs:  Imaging results:  Dg Chest 2 View  03/24/2013   CLINICAL DATA:  Cough and fever  EXAM: CHEST  2 VIEW  COMPARISON:  02/08/2013  FINDINGS: Resolution of lingular infiltrate. Negative for pneumonia on today's study.  Cardiac enlargement with vascular congestion. This has progressed from the prior study. There is no effusion.  IMPRESSION: Resolution of lingular infiltrate  Pulmonary vascular congestion suggesting  mild fluid overload.   Electronically Signed   By: Franchot Gallo M.D.   On: 03/24/2013 11:09    Other results:  Assessment & Plan by Problem:  Patient is a 58 year old lady with a past medical history for congestive heart failure (, asthma, hypertension, hyperlipidemia, obstructive sleep apnea, who presents with cough, shortness of breath, wheezing and fever. WBC 10.6, proBNP 159, potassium 3. 3, creatinine 0.81, chest x-ray has no infiltration.  #: Asthma exacerbation: Patient's symptoms are most likely caused by asthma exacerbation. Possible triggering factor is likely upper respiratory viral infection or  flu. She could have possible sick contact by attending her father's funeral 2 weeks ago. Since patient has productive cough, antibiotics is indicated. Other differential diagnoses include, but likely, pulmonary embolism (Low Well's score), pneumonia (no infiltration on chest x-ray and congestive heart failure exacerbation (euvolemic on physical examination). Patient was already started with doxycycline by her pulmonologist. Of note, per her pulmonologist, Dr. 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  No ILD. No pulm HTN. Patient has appointment at [facility] at beginning of February(03/11/13?).  plan: - Admit to med-surg.  - Check flu pcr - Continue  oral doxycycline  - Albuterol/ Atrovent nebs q4h scheduled and q2h PRN albuterol nebulizer   - IV solumedrol 79m q12 hours.  - Continuous pulse ox, with O2 supplementation if O2 sat < 92%.  - Robitussin for cough  #: CHF: 2-D echo on 06/17/12 showed EF of 60-65% with grade 2 diastolic dysfunction. She had right heart cath done on 12/221/4, showed mildly elevated right-sided cardiac pressures with preserved cardiac output and borderline pulmonary pressure but no evidence of significant pulmonary HTN. Per Dr. CAntionette Charnote, no compelling evidence of right-sided cardiac failure or pulmonary HTN. Patient is taking Lasix 80 mg daily at home. Although chest x-ray showed possible pulmonary vascular congestion. her body weight has been stable (276 LBs 3 weeks ago-->275 on 03/16/13) per patient. No JVD or leg edema. Pro BNP only 159. She is clinically euvolemic.  -will continue home lasix 80 mg daily.  -f/u BMP for electrolytes  #: Hypertension: bp is 152/81 on admission -will continue home medications: Lasix 80 mg daily, losartan 50 mg daily.  #: Depression: stable -Continue home medications: Requip and Cymbalta   #: GERD (gastroesophageal reflux disease): stable -Continue home Protonix    #: Hyperlipidemia: no lipid profile in record. On pravastatin at home. -Switched to simvastatin while in the hospital -will check lipid profile   #: OSA (obstructive sleep apnea):  -Continue CPAP   #  F/E/N  -SL:  -Hypokalemia:  Kdur 40 meQ x1. will monitor electrolytes by checking BMP -Diet: Heart health diet  # DVT px: Heparin sq    Dispo: Disposition is deferred at this time, awaiting improvement of current medical problems. Anticipated discharge in approximately 1 to 2 day(s).   The patient does have a current PCP (SCOTT, ROBERT, MD), therefore is requiring [facility]PC follow-up after discharge.   The patient does not have transportation limitations that hinder transportation to clinic appointments.  Signed:  XIvor Costa MD PGY3, Internal Medicine Teaching Service Pager: [phone] 03/24/2013, 12:13 PM

## 2013-03-24 NOTE — Progress Notes (Signed)
Patient has arrived to unit and taken to room 3E15. CCMD notified of patients admission. Kathleen Gala, RN from admission also made aware. Patient alert and oriented, ambulatory to bed with some shortness of breath. Patient's O2 saturations 90% on room air. Nasal cannula set up at bedside. Patient has no other complaints at this time other than shortness of breath. Patient oriented to 3E and fall prevention techniques.  Call bell in reach.

## 2013-03-24 NOTE — ED Provider Notes (Signed)
Medical screening examination/treatment/procedure(s) were performed by non-physician practitioner and as supervising physician I was immediately available for consultation/collaboration.  EKG Interpretation    Date/Time:  Friday March 24 2013 09:47:35 EST Ventricular Rate:  87 PR Interval:  184 QRS Duration: 112 QT Interval:  400 QTC Calculation: 481 R Axis:   46 Text Interpretation:  Age not entered, assumed to be  58 years old for purpose of ECG interpretation Sinus rhythm Incomplete right bundle branch block Low voltage, precordial leads Borderline prolonged QT interval Baseline wander in lead(s) V1 No significant change since last tracing Confirmed by Loxley Schmale  MD, Shyleigh Daughtry ([id number]) on 03/24/2013 10:55:56 AM              Ephraim Hamburger, MD 03/24/13 2236

## 2013-03-24 NOTE — ED Notes (Signed)
Cleatrice Burke, PA at bedside

## 2013-03-25 DIAGNOSIS — J45901 Unspecified asthma with (acute) exacerbation: Secondary | ICD-10-CM

## 2013-03-25 DIAGNOSIS — I10 Essential (primary) hypertension: Secondary | ICD-10-CM

## 2013-03-25 DIAGNOSIS — G4733 Obstructive sleep apnea (adult) (pediatric): Secondary | ICD-10-CM

## 2013-03-25 DIAGNOSIS — K219 Gastro-esophageal reflux disease without esophagitis: Secondary | ICD-10-CM

## 2013-03-25 DIAGNOSIS — F3289 Other specified depressive episodes: Secondary | ICD-10-CM

## 2013-03-25 DIAGNOSIS — I509 Heart failure, unspecified: Secondary | ICD-10-CM

## 2013-03-25 DIAGNOSIS — E785 Hyperlipidemia, unspecified: Secondary | ICD-10-CM

## 2013-03-25 DIAGNOSIS — F329 Major depressive disorder, single episode, unspecified: Secondary | ICD-10-CM

## 2013-03-25 LAB — LIPID PANEL
Cholesterol: 236 mg/dL — ABNORMAL HIGH (ref 0–200)
HDL: 34 mg/dL — ABNORMAL LOW (ref >39)
LDL Cholesterol: 133 mg/dL — ABNORMAL HIGH (ref 0–99)
Total CHOL/HDL Ratio: 6.9 RATIO
Triglycerides: 345 mg/dL — ABNORMAL HIGH (ref <150)
VLDL: 69 mg/dL — ABNORMAL HIGH (ref 0–40)

## 2013-03-25 LAB — CBC
HCT: 31.6 % — ABNORMAL LOW (ref 36.0–46.0)
Hemoglobin: 9.9 g/dL — ABNORMAL LOW (ref 12.0–15.0)
MCH: 23.6 pg — ABNORMAL LOW (ref 26.0–34.0)
MCHC: 31.3 g/dL (ref 30.0–36.0)
MCV: 75.2 fL — ABNORMAL LOW (ref 78.0–100.0)
Platelets: 455 10*3/uL — ABNORMAL HIGH (ref 150–400)
RBC: 4.2 MIL/uL (ref 3.87–5.11)
RDW: 17.8 % — ABNORMAL HIGH (ref 11.5–15.5)
WBC: 13.2 10*3/uL — ABNORMAL HIGH (ref 4.0–10.5)

## 2013-03-25 LAB — BASIC METABOLIC PANEL
BUN: 13 mg/dL (ref 6–23)
CO2: 22 mEq/L (ref 19–32)
Calcium: 9.6 mg/dL (ref 8.4–10.5)
Chloride: 97 mEq/L (ref 96–112)
Creatinine, Ser: 0.72 mg/dL (ref 0.50–1.10)
GFR calc Af Amer: >90 mL/min (ref >90)
GFR calc non Af Amer: >90 mL/min (ref >90)
Glucose, Bld: 276 mg/dL — ABNORMAL HIGH (ref 70–99)
Potassium: 4.1 mEq/L (ref 3.7–5.3)
Sodium: 136 mEq/L — ABNORMAL LOW (ref 137–147)

## 2013-03-25 MED ORDER — PREDNISONE 50 MG PO TABS
60.0000 mg | ORAL_TABLET | Freq: Every day | ORAL | Status: DC
  Administered 2013-03-25 – 2013-03-27 (×3): 60 mg via ORAL
  Filled 2013-03-25 (×4): qty 1

## 2013-03-25 MED ORDER — PREDNISONE 20 MG PO TABS
40.0000 mg | ORAL_TABLET | Freq: Every day | ORAL | Status: DC
  Filled 2013-03-25 (×2): qty 2

## 2013-03-25 MED ORDER — BACLOFEN 5 MG HALF TABLET
5.0000 mg | ORAL_TABLET | Freq: Two times a day (BID) | ORAL | Status: DC
  Administered 2013-03-25 – 2013-03-27 (×5): 5 mg via ORAL
  Filled 2013-03-25 (×6): qty 1

## 2013-03-25 MED ORDER — HYDROCODONE-HOMATROPINE 5-1.5 MG/5ML PO SYRP
5.0000 mL | ORAL_SOLUTION | ORAL | Status: DC
  Administered 2013-03-25 – 2013-03-27 (×13): 5 mL via ORAL
  Filled 2013-03-25 (×14): qty 5

## 2013-03-25 MED ORDER — ONDANSETRON HCL 4 MG/2ML IJ SOLN
4.0000 mg | Freq: Four times a day (QID) | INTRAMUSCULAR | Status: DC | PRN
  Administered 2013-03-25: 4 mg via INTRAVENOUS
  Filled 2013-03-25: qty 2

## 2013-03-25 MED ORDER — HYDROCODONE-ACETAMINOPHEN 5-325 MG PO TABS
1.0000 | ORAL_TABLET | Freq: Once | ORAL | Status: AC
  Administered 2013-03-25: 1 via ORAL
  Filled 2013-03-25: qty 1

## 2013-03-25 NOTE — H&P (Signed)
  Date: 03/25/2013  Patient name: NIKITIA ASBILL  Medical record number: [id number]  Date of birth: [birth year]   I have seen and evaluated Eveline Keto and discussed their care with the Residency Team. Ms [place] Crane follows with Dr Joya Gaskins for severe persistant, poorly responsive asthma and small airway disease as seen on CT. Her PFT's 2014 show FEV1 / FVC 64 and FEV! % precd 57. She has been referred to [facility] for second opinion. Her W/U also included RHC (no compelling evidence of right-sided cardiac failure or pulmonary HTN), myoview (negative, sleep study (comfirmed OSA and uses CPAP), anxiety and referral to pysch, and wt loss rec.   Assessment and Plan: I have seen and evaluated the patient as outlined above. I agree with the formulated Assessment and Plan as detailed in the residents' admission note, with the following changes:   1. Acute on chronic resp failure - She has had a slow decline in her pul status over past yr with rapid decline past 3 weeks. She feels that the past three weeks are not representative of a typical asthma attack. Her subjective sxs are much worse than her objective findings. She is able to speak in a full sentence this AM, has decent air flow and no wheezing (RN did say that in AM prior to nebs she was tight and wheezing). But she states that just moving in bed causes sig SOB, she states she is coughing quite bad, her chest is tight, feels like she can't breathe in, a lot of congestion. Currently, she is being tx like an asthma exac with steroids, ABX, nebs, and O2. Ih she fails to improve by the AM, we will consult pul.    Bartholomew Crews, MD 1/17/20151:27 PM

## 2013-03-25 NOTE — Progress Notes (Signed)
Pts sat on 2L is 95%. 02 removed and sat at rest was only 88%. Will recheck later and ambulate

## 2013-03-25 NOTE — Progress Notes (Signed)
Subjective: Patient is tearful this morning and feels anxious about her breathing. She feels as though her chest is tight and states "it will kill me." She had received a neb treatment 30 mins before my exam. Tele showed intermittent tachycardia. She is wearing 2LPM on via [place] because patient feels as though she needs it.  Objective: Vital signs in last 24 hours: Filed Vitals:   03/25/13 0058 03/25/13 0350 03/25/13 0500 03/25/13 0823  BP:   142/84   Pulse:   96   Temp:   98.3 F (36.8 C)   TempSrc:   Oral   Resp:   18   Height:      Weight:   275 lb 5.7 oz (124.9 kg)   SpO2: 95% 96% 95% 93%   Weight change:   Intake/Output Summary (Last 24 hours) at 03/25/13 1144 Last data filed at 03/25/13 0850  Gross per 24 hour  Intake    966 ml  Output    300 ml  Net    666 ml   Physical Exam General: tearful and very anxious appearing HEENT: [place]/AT, vision grossly intact, oropharynx clear and non-erythematous  Neck: supple Lungs: clear to ascultation bilaterally, fair air movement, normal work of respiration; on O2 2LPM Heart: regular rate and rhythm, no murmurs, gallops, or rubs Abdomen: soft, obese, non-tender, non-distended, normal bowel sounds Extremities: warm, no BLE edema Neurologic: alert & oriented X3, cranial nerves II-XII grossly intact, strength grossly intact, sensation intact to light touch  Lab Results: Basic Metabolic Panel:  Recent Labs Lab 03/24/13 1019 03/25/13 0500  NA 140 136*  K 3.3* 4.1  CL 98 97  CO2 28 22  GLUCOSE 151* 276*  BUN 11 13  CREATININE 0.81 0.72  CALCIUM 9.2 9.6   Liver Function Tests:  Recent Labs Lab 03/24/13 1632  AST 31  ALT 23  ALKPHOS 93  BILITOT 0.2*  PROT 7.8  ALBUMIN 3.4*   CBC:  Recent Labs Lab 03/24/13 1019 03/25/13 0500  WBC 10.6* 13.2*  NEUTROABS 7.4  --   HGB 9.9* 9.9*  HCT 31.7* 31.6*  MCV 74.9* 75.2*  PLT 416* 455*   BNP:  Recent Labs Lab 03/24/13 1019  PROBNP 159.8*   Fasting Lipid  Panel:  Recent Labs Lab 03/25/13 0500  CHOL 236*  HDL 34*  LDLCALC 133*  TRIG 345*  CHOLHDL 6.9   Coagulation:  Recent Labs Lab 03/24/13 1632  LABPROT 12.5  INR 0.95    Micro Results: No results found for this or any previous visit (from the past 240 hour(s)). Studies/Results: Dg Chest 2 View  03/24/2013   CLINICAL DATA:  Cough and fever  EXAM: CHEST  2 VIEW  COMPARISON:  02/08/2013  FINDINGS: Resolution of lingular infiltrate. Negative for pneumonia on today's study.  Cardiac enlargement with vascular congestion. This has progressed from the prior study. There is no effusion.  IMPRESSION: Resolution of lingular infiltrate  Pulmonary vascular congestion suggesting mild fluid overload.   Electronically Signed   By: Franchot Gallo M.D.   On: 03/24/2013 11:09   Medications: I have reviewed the patient's current medications. Scheduled Meds: . cholecalciferol  5,000 Units Oral Daily  . doxycycline  100 mg Oral BID  . DULoxetine  30 mg Oral QPM  . DULoxetine  60 mg Oral q morning - 10a  . estradiol  2 mg Oral Daily  . folic acid  1 mg Oral BID  . furosemide  80 mg Oral q morning - 10a  .  gabapentin  300 mg Oral QHS  . heparin  5,000 Units Subcutaneous Q8H  . ipratropium-albuterol  3 mL Nebulization Q4H  . Linaclotide  145 mcg Oral QODAY  . losartan  50 mg Oral QHS  . pantoprazole  40 mg Oral BID  . potassium chloride SA  20 mEq Oral Daily  . pravastatin  20 mg Oral Q Fri-1800  . rOPINIRole  4 mg Oral BID  . vitamin E  400 Units Oral Daily   Continuous Infusions:  PRN Meds:.acetaminophen, albuterol, aspirin-acetaminophen-caffeine, diphenhydrAMINE, guaiFENesin-dextromethorphan, ondansetron, pseudoephedrine  Assessment/Plan: # Asthma exacerbation: Patient is increasingly anxious, though she is improving well clinically- not hypoxic at rest, no wheezing. I believe there is a large anxiety component. She is still having some subjective chest tightness. Flu pcr negative. VSS.  WBC increased slightly today (13.2), though this is likely 2/2 steroids. -ambulate off of oxygen, check SpO2 -switch solumedrol 32m q12h to prednisone 635mqdaily - Continue oral doxycycline  - Albuterol/ Atrovent nebs q4h scheduled and q2h PRN albuterol nebulizer  -f/u Bcx  # CHF: 2-D echo on 06/17/12 showed EF of 60-65% with grade 2 diastolic dysfunction. She is on her home dose of Lasix 80 mg daily and remains euvolemic. -will continue home lasix 80 mg daily.   # Hypertension: bp is stable. -continue home medications: Lasix 80 mg daily, losartan 50 mg daily.   # Depression: stable  -Continue home medications: Requip and Cymbalta   # GERD: stable  -Continue home Protonix   # Hyperlipidemia: no lipid profile in record. On pravastatin at home. Lipid panel shows total chol 236, TG 345, LDL 133, HDL 34. -Switched to simvastatin while in the hospital   # OSA (obstructive sleep apnea):  -Continue CPAP   # DVT px: Heparin sq  Dispo: Disposition is deferred at this time, awaiting improvement of current medical problems.  Anticipated discharge in approximately 1-2 day(s).   The patient does have a current PCP (RMyer PeerMD) and [facility].  The patient does not have transportation limitations that hinder transportation to clinic appointments.  .Services Needed at time of discharge: Y = Yes, Blank = No PT:   OT:   RN:   Equipment:   Other:     LOS: 1 day   RaRebecca EatonMD 03/25/2013, 11:44 AM

## 2013-03-25 NOTE — Progress Notes (Signed)
03/25/15 1900-0700. Pt.is A/Ox4 and is ambulates independently. She is has oxygen 2 L [place] prn for comfort. She still has some complaints of SOB with exertion and still continues to received scheduled breathing treatments. Patient c/o lower back pain so prn tylenol was given. Pt.c/o of back pain again. I asked what she took at home for pain and she said that she takes half of a hydrocodone 7/349m for pain. Called physician on call with Internal Medicine teaching services and a new order was written for a one time dose of pain medication.

## 2013-03-25 NOTE — Progress Notes (Signed)
UR Completed.

## 2013-03-26 DIAGNOSIS — I5030 Unspecified diastolic (congestive) heart failure: Secondary | ICD-10-CM

## 2013-03-26 DIAGNOSIS — F411 Generalized anxiety disorder: Secondary | ICD-10-CM

## 2013-03-26 MED ORDER — FUROSEMIDE 40 MG PO TABS
40.0000 mg | ORAL_TABLET | Freq: Once | ORAL | Status: AC
  Administered 2013-03-26: 40 mg via ORAL
  Filled 2013-03-26: qty 1

## 2013-03-26 NOTE — Progress Notes (Addendum)
Subjective: Kathleen Hatfield was seen and examined at bedside this morning.  She was not coughing and appeared less anxious today.  She reports feeling a little better with improved cough and able to bring up phlegm but does not feel back to baseline.  She is requiring even at rest, down to 86% on rest on room air and dropped to 80% on room air with ambulation with improvement on 2L [place] to 97%.  She was not on oxygen prior to admission but has been on it for a short while at home.    Objective: Vital signs in last 24 hours: Filed Vitals:   03/26/13 0019 03/26/13 0307 03/26/13 0607 03/26/13 0750  BP:   145/66   Pulse:   83   Temp:   97.7 F (36.5 C)   TempSrc:   Oral   Resp:   18   Height:      Weight:   276 lb 8 oz (125.42 kg)   SpO2: 94% 95% 98% 92%   Weight change: 1 lb 8 oz (0.68 kg)  Intake/Output Summary (Last 24 hours) at 03/26/13 1200 Last data filed at 03/26/13 0856  Gross per 24 hour  Intake   1200 ml  Output   2100 ml  Net   -900 ml   Physical Exam Vitals reviewed. General: sitting up on side of bed, NAD, on 2L [place] O2 HEENT: EOMI Cardiac: RRR Pulm: b/l diffuse expiratory wheezing, coughing with deep inspiration Abd: soft, nontender, obese, nondistended, BS present Ext: warm and well perfused, +1 pitting edema b/l lower extremities Neuro: alert and oriented X3, cranial nerves II-XII grossly intact, strength equal in bilateral upper and lower extremities  Lab Results: Basic Metabolic Panel:  Recent Labs Lab 03/24/13 1019 03/25/13 0500  NA 140 136*  K 3.3* 4.1  CL 98 97  CO2 28 22  GLUCOSE 151* 276*  BUN 11 13  CREATININE 0.81 0.72  CALCIUM 9.2 9.6   Liver Function Tests:  Recent Labs Lab 03/24/13 1632  AST 31  ALT 23  ALKPHOS 93  BILITOT 0.2*  PROT 7.8  ALBUMIN 3.4*   CBC:  Recent Labs Lab 03/24/13 1019 03/25/13 0500  WBC 10.6* 13.2*  NEUTROABS 7.4  --   HGB 9.9* 9.9*  HCT 31.7* 31.6*  MCV 74.9* 75.2*  PLT 416* 455*   BNP:  Recent  Labs Lab 03/24/13 1019  PROBNP 159.8*   Fasting Lipid Panel:  Recent Labs Lab 03/25/13 0500  CHOL 236*  HDL 34*  LDLCALC 133*  TRIG 345*  CHOLHDL 6.9   Coagulation:  Recent Labs Lab 03/24/13 1632  LABPROT 12.5  INR 0.95   Micro Results: Recent Results (from the past 240 hour(s))  CULTURE, BLOOD (ROUTINE X 2)     Status: None   Collection Time    03/24/13 11:30 AM      Result Value Range Status   Specimen Description BLOOD LEFT HAND   Final   Special Requests BOTTLES DRAWN AEROBIC AND ANAEROBIC 10MLS   Final   Culture  Setup Time     Final   Value: 03/24/2013 16:03     Performed at [facility]   Culture     Final   Value:        BLOOD CULTURE RECEIVED NO GROWTH TO DATE CULTURE WILL BE HELD FOR 5 DAYS BEFORE ISSUING A FINAL NEGATIVE REPORT     Performed at [facility]   Report Status PENDING   Incomplete  CULTURE, BLOOD (ROUTINE X 2)     Status: None   Collection Time    03/24/13 11:35 AM      Result Value Range Status   Specimen Description BLOOD RIGHT ANTECUBITAL   Final   Special Requests BOTTLES DRAWN AEROBIC AND ANAEROBIC 10MLS   Final   Culture  Setup Time     Final   Value: 03/24/2013 16:03     Performed at [facility]   Culture     Final   Value:        BLOOD CULTURE RECEIVED NO GROWTH TO DATE CULTURE WILL BE HELD FOR 5 DAYS BEFORE ISSUING A FINAL NEGATIVE REPORT     Performed at [facility]   Report Status PENDING   Incomplete   Medications: I have reviewed the patient's current medications. Scheduled Meds: . baclofen  5 mg Oral BID  . cholecalciferol  5,000 Units Oral Daily  . doxycycline  100 mg Oral BID  . DULoxetine  30 mg Oral QPM  . DULoxetine  60 mg Oral q morning - 10a  . estradiol  2 mg Oral Daily  . folic acid  1 mg Oral BID  . furosemide  40 mg Oral Once  . furosemide  80 mg Oral q morning - 10a  . gabapentin  300 mg Oral QHS  . heparin  5,000 Units Subcutaneous Q8H  . HYDROcodone-homatropine  5 mL  Oral Q4H  . ipratropium-albuterol  3 mL Nebulization Q4H  . Linaclotide  145 mcg Oral QODAY  . losartan  50 mg Oral QHS  . pantoprazole  40 mg Oral BID  . potassium chloride SA  20 mEq Oral Daily  . pravastatin  20 mg Oral Q Fri-1800  . predniSONE  60 mg Oral Q breakfast  . rOPINIRole  4 mg Oral BID  . vitamin E  400 Units Oral Daily   Continuous Infusions:  PRN Meds:.acetaminophen, albuterol, aspirin-acetaminophen-caffeine, diphenhydrAMINE, ondansetron, pseudoephedrine  Assessment/Plan: Kathleen Hatfield is a 58 year old obese non-smoker female with asthma (since childhood) and dCHF admitted for asthma exacerbation with worsening productive cough.   Asthma exacerbation: mildly improved but requiring oxygen at rest and with ambulation, continues to have wheezing on ausculation.  Cough has improved slightly and anxiety also contributing to poor respiratory status.  - continue po prednisone 26m qdaily - Continue oral doxycycline--today is day 3 - Albuterol/ Atrovent nebs q4h scheduled and q2h PRN albuterol nebulizer  - f/u Bcx--NGTD - peak flow pre and post treatment - [place] O2, keep o2 sat >92% - will give extra dose of lasix 453mpo today given edema on lower extremities and congestion on cxr, if no clinical improvement, may need repeat cxr, ?infiltrate although afebrile but does have productive cough - if no improvement, may need to discuss with pulmonologist, Dr. WrJoya Gaskinsurther. Patient was referred to [facility] for further evaluation.  CHF: 2-D echo on 06/17/12 showed EF of 60-65% with grade 2 diastolic dysfunction. She is on her home dose of Lasix 80 mg daily but does have pitting edema on lower extremities and congestion on cxr. Weight up 1 pound today since yesterday, today 276lb's.   She was down to 272lb's in August 2014.  -will continue home lasix 80 mg daily and give 4057mo x1 this afteroon -daily weight -strict I/O's: -900m35mt yesterday  Hypertension: bp is stable. -continue home  medications: Lasix 80 mg daily, losartan 50 mg daily -added extra dose of lasix today  Depression and  anxiety:  -Continue home medications: Requip and Cymbalta   GERD: stable  -Continue home Protonix   Hyperlipidemia: On pravastatin at home. Lipid panel shows total chol 236, TG 345, LDL 133, HDL 34. -Switched to simvastatin while in the hospital   OSA (obstructive sleep apnea):  -Continue CPAP   DVT px: Heparin sq Diet: Heart Healthy Dispo: Disposition is deferred at this time, awaiting improvement of current medical problems.  Anticipated discharge in approximately 1-2 day(s).   The patient does have a current PCP Myer Peer, MD) and does not need an [facility] hospital follow-up appointment after discharge.  The patient does not have transportation limitations that hinder transportation to clinic appointments.  Services Needed at time of discharge: Y = Yes, Blank = No PT:   OT:   RN:   Equipment:   Other:     LOS: 2 days   Jerene Pitch, MD 03/26/2013, 12:00 PM

## 2013-03-26 NOTE — Progress Notes (Signed)
Utilization review completed.  

## 2013-03-26 NOTE — Progress Notes (Signed)
Pt continues to desat when up and about in room without 02. Random check off 02 was 84%

## 2013-03-26 NOTE — Progress Notes (Signed)
Pt up to sink washing up. 02 sat checked- pt off 02- 86%. Pt ambulated in hallway with sats initially up to 87-88% initially but then dropped to 80%. Sat rechecked off 02 at rest- only 86%. 02 at 2l applied with sat up to 97%. Pt did become more SOB with ambulation.

## 2013-03-26 NOTE — Progress Notes (Signed)
Peak flow before neb was 120 after was 140 goal for pt is 440

## 2013-03-27 DIAGNOSIS — I5032 Chronic diastolic (congestive) heart failure: Secondary | ICD-10-CM

## 2013-03-27 MED ORDER — HYDROCODONE-HOMATROPINE 5-1.5 MG/5ML PO SYRP
5.0000 mL | ORAL_SOLUTION | ORAL | Status: DC
Start: 2013-03-27 — End: 2013-05-11

## 2013-03-27 MED ORDER — PREDNISONE 10 MG PO TABS: ORAL_TABLET | ORAL | Status: DC

## 2013-03-27 MED ORDER — BACLOFEN 5 MG HALF TABLET: 5.0000 mg | ORAL_TABLET | Freq: Two times a day (BID) | ORAL | Status: DC

## 2013-03-27 MED ORDER — DOXYCYCLINE HYCLATE 100 MG PO TABS: 100.0000 mg | ORAL_TABLET | Freq: Two times a day (BID) | ORAL | Status: DC

## 2013-03-27 NOTE — Progress Notes (Signed)
Internal Medicine Attending  Date: 03/27/2013  Patient name: Kathleen Hatfield Medical record number: [id number] Date of birth: 1955-10-13 Age: 58 y.o. Gender: female  I saw and evaluated the patient. I reviewed the resident's note by Dr. Mechele Claude and I agree with the resident's findings and plans as documented in her note.

## 2013-03-27 NOTE — Discharge Instructions (Signed)
Please keep you appointment with [facility] on April 11, 2013.  Taper prednisone as follows:  12m (5 pills) for 2 days, 43m(4 pills) for 2 days, 3075m3 pills) for 2 days, 46m67mpills) for 2 days, 10mg71mpill) for 2 days.  Continue the doxycycline twice daily for 1 more day (ONLY 03/28/2013)  Use your albuterol nebulizer every 4 hours as needed for your breathing.   Please use the home oxygen continuously at home.  Asthma Attack Prevention Although there is no way to prevent asthma from starting, you can take steps to control the disease and reduce its symptoms. Learn about your asthma and how to control it. Take an active role to control your asthma by working with your health care provider to create and follow an asthma action plan. An asthma action plan guides you in:  Taking your medicines properly.  Avoiding things that set off your asthma or make your asthma worse (asthma triggers).  Tracking your level of asthma control.  Responding to worsening asthma.  Seeking emergency care when needed. To track your asthma, keep records of your symptoms, check your peak flow number using a handheld device that shows how well air moves out of your lungs (peak flow meter), and get regular asthma checkups.  WHAT ARE SOME WAYS TO PREVENT AN ASTHMA ATTACK?  Take medicines as directed by your health care provider.  Keep track of your asthma symptoms and level of control.  With your health care provider, write a detailed plan for taking medicines and managing an asthma attack. Then be sure to follow your action plan. Asthma is an ongoing condition that needs regular monitoring and treatment.  Identify and avoid asthma triggers. Many outdoor allergens and irritants (such as pollen, mold, cold air, and air pollution) can trigger asthma attacks. Find out what your asthma triggers are and take steps to avoid them.  Monitor your breathing. Learn to recognize warning signs of an attack, such as  coughing, wheezing, or shortness of breath. Your lung function may decrease before you notice any signs or symptoms, so regularly measure and record your peak airflow with a home peak flow meter.  Identify and treat attacks early. If you act quickly, you are less likely to have a severe attack. You will also need less medicine to control your symptoms. When your peak flow measurements decrease and alert you to an upcoming attack, take your medicine as instructed and immediately stop any activity that may have triggered the attack. If your symptoms do not improve, get medical help.  Pay attention to increasing quick-relief inhaler use. If you find yourself relying on your quick-relief inhaler, your asthma is not under control. See your health care provider about adjusting your treatment. WHAT CAN MAKE MY SYMPTOMS WORSE? A number of common things can set off or make your asthma symptoms worse and cause temporary increased inflammation of your airways. Keep track of your asthma symptoms for several weeks, detailing all the environmental and emotional factors that are linked with your asthma. When you have an asthma attack, go back to your asthma diary to see which factor, or combination of factors, might have contributed to it. Once you know what these factors are, you can take steps to control many of them. If you have allergies and asthma, it is important to take asthma prevention steps at home. Minimizing contact with the substance to which you are allergic will help prevent an asthma attack. Some triggers and ways to avoid these  triggers are: Animal Dander:  Some people are allergic to the flakes of skin or dried saliva from animals with fur or feathers.   There is no such thing as a hypoallergenic dog or cat breed. All dogs or cats can cause allergies, even if they don't shed.  Keep these pets out of your home.  If you are not able to keep a pet outdoors, keep the pet out of your bedroom and other  sleeping areas at all times, and keep the door closed.  Remove carpets and furniture covered with cloth from your home. If that is not possible, keep the pet away from fabric-covered furniture and carpets. Dust Mites: Many people with asthma are allergic to dust mites. Dust mites are tiny bugs that are found in every home in mattresses, pillows, carpets, fabric-covered furniture, bedcovers, clothes, stuffed toys, and other fabric-covered items.   Cover your mattress in a special dust-proof cover.  Cover your pillow in a special dust-proof cover, or wash the pillow each week in hot water. Water must be hotter than 130 F (54.4 C) to kill dust mites. Cold or warm water used with detergent and bleach can also be effective.  Wash the sheets and blankets on your bed each week in hot water.  Try not to sleep or lie on cloth-covered cushions.  Call ahead when traveling and ask for a smoke-free hotel room. Bring your own bedding and pillows in case the hotel only supplies feather pillows and down comforters, which may contain dust mites and cause asthma symptoms.  Remove carpets from your bedroom and those laid on concrete, if you can.  Keep stuffed toys out of the bed, or wash the toys weekly in hot water or cooler water with detergent and bleach. Cockroaches: Many people with asthma are allergic to the droppings and remains of cockroaches.   Keep food and garbage in closed containers. Never leave food out.  Use poison baits, traps, powders, gels, or paste (for example, boric acid).  If a spray is used to kill cockroaches, stay out of the room until the odor goes away. Indoor Mold:  Fix leaky faucets, pipes, or other sources of water that have mold around them.  Clean floors and moldy surfaces with a fungicide or diluted bleach.  Avoid using humidifiers, vaporizers, or swamp coolers. These can spread molds through the air. Pollen and Outdoor Mold:  When pollen or mold spore counts are  high, try to keep your windows closed.  Stay indoors with windows closed from late morning to afternoon. Pollen and some mold spore counts are highest at that time.  Ask your health care provider whether you need to take anti-inflammatory medicine or increase your dose of the medicine before your allergy season starts. Other Irritants to Avoid:  Tobacco smoke is an irritant. If you smoke, ask your health care provider how you can quit. Ask family members to quit smoking too. Do not allow smoking in your home or car.  If possible, do not use a wood-burning stove, kerosene heater, or fireplace. Minimize exposure to all sources of smoke, including to incense, candles, fires, and fireworks.  Try to stay away from strong odors and sprays, such as perfume, talcum powder, hair spray, and paints.  Decrease humidity in your home and use an indoor air cleaning device. Reduce indoor humidity to below 60%. Dehumidifiers or central air conditioners can do this.  Decrease house dust exposure by changing furnace and air cooler filters frequently.  Try to have  someone else vacuum for you once or twice a week. Stay out of rooms while they are being vacuumed and for a short while afterward.  If you vacuum, use a dust mask from a hardware store, a double-layered or microfilter vacuum cleaner bag, or a vacuum cleaner with a HEPA filter.  Sulfites in foods and beverages can be irritants. Do not drink beer or wine or eat dried fruit, processed potatoes, or shrimp if they cause asthma symptoms.  Cold air can trigger an asthma attack. Cover your nose and mouth with a scarf on cold or windy days.  Several health conditions can make asthma more difficult to manage, including a runny nose, sinus infections, reflux disease, psychological stress, and sleep apnea. Work with your health care provider to manage these conditions.  Avoid close contact with people who have a respiratory infection such as a cold or the flu,  since your asthma symptoms may get worse if you catch the infection. Wash your hands thoroughly after touching items that may have been handled by people with a respiratory infection.  Get a flu shot every year to protect against the flu virus, which often makes asthma worse for days or weeks. Also get a pneumonia shot if you have not previously had one. Unlike the flu shot, the pneumonia shot does not need to be given yearly. Medicines:  Talk to your health care provider about whether it is safe for you to take aspirin or non-steroidal anti-inflammatory medicines (NSAIDs). In a small number of people with asthma, aspirin and NSAIDs can cause asthma attacks. These medicines must be avoided by people who have known aspirin-sensitive asthma. It is important that people with aspirin-sensitive asthma read labels of all over-the-counter medicines used to treat pain, colds, coughs, and fever.  Beta blockers and ACE inhibitors are other medicines you should discuss with your health care provider. HOW CAN I FIND OUT WHAT I AM ALLERGIC TO? Ask your asthma health care provider about allergy skin testing or blood testing (the RAST test) to identify the allergens to which you are sensitive. If you are found to have allergies, the most important thing to do is to try to avoid exposure to any allergens that you are sensitive to as much as possible. Other treatments for allergies, such as medicines and allergy shots (immunotherapy) are available.  CAN I EXERCISE? Follow your health care provider's advice regarding asthma treatment before exercising. It is important to maintain a regular exercise program, but vigorous exercise, or exercise in cold, humid, or dry environments can cause asthma attacks, especially for those people who have exercise-induced asthma. Document Released: 02/11/2009 Document Revised: 10/26/2012 Document Reviewed: 08/31/2012 [facility] Patient Information 2014 [place].  Asthma,  Adult Asthma is a recurring condition in which the airways tighten and narrow. Asthma can make it difficult to breathe. It can cause coughing, wheezing, and shortness of breath. Asthma episodes (also called asthma attacks) range from minor to life-threatening. Asthma cannot be cured, but medicines and lifestyle changes can help control it. CAUSES Asthma is believed to be caused by inherited (genetic) and environmental factors, but its exact cause is unknown. Asthma may be triggered by allergens, lung infections, or irritants in the air. Asthma triggers are different for each person. Common triggers include:   Animal dander.  Dust mites.  Cockroaches.  Pollen from trees or grass.  Mold.  Smoke.  Air pollutants such as dust, household cleaners, hair sprays, aerosol sprays, paint fumes, strong chemicals, or strong odors.  Cold  air, weather changes, and winds (which increase molds and pollens in the air).  Strong emotional expressions such as crying or laughing hard.  Stress.  Certain medicines (such as aspirin) or types of drugs (such as beta-blockers).  Sulfites in foods and drinks. Foods and drinks that may contain sulfites include dried fruit, potato chips, and sparkling grape juice.  Infections or inflammatory conditions such as the flu, a cold, or an inflammation of the nasal membranes (rhinitis).  Gastroesophageal reflux disease (GERD).  Exercise or strenuous activity. SYMPTOMS Symptoms may occur immediately after asthma is triggered or many hours later. Symptoms include:  Wheezing.  Excessive nighttime or early morning coughing.  Frequent or severe coughing with a common cold.  Chest tightness.  Shortness of breath. DIAGNOSIS  The diagnosis of asthma is made by a review of your medical history and a physical exam. Tests may also be performed. These may include:  Lung function studies. These tests show how much air you breath in and out.  Allergy  tests.  Imaging tests such as X-rays. TREATMENT  Asthma cannot be cured, but it can usually be controlled. Treatment involves identifying and avoiding your asthma triggers. It also involves medicines. There are 2 classes of medicine used for asthma treatment:   Controller medicines. These prevent asthma symptoms from occurring. They are usually taken every day.  Reliever or rescue medicines. These quickly relieve asthma symptoms. They are used as needed and provide short-term relief. Your health care provider will help you create an asthma action plan. An asthma action plan is a written plan for managing and treating your asthma attacks. It includes a list of your asthma triggers and how they may be avoided. It also includes information on when medicines should be taken and when their dosage should be changed. An action plan may also involve the use of a device called a peak flow meter. A peak flow meter measures how well the lungs are working. It helps you monitor your condition. HOME CARE INSTRUCTIONS   Take medicine as directed by your health care provider. Speak with your health care provider if you have questions about how or when to take the medicines.  Use a peak flow meter as directed by your health care provider. Record and keep track of readings.  Understand and use the action plan to help minimize or stop an asthma attack without needing to seek medical care.  Control your home environment in the following ways to help prevent asthma attacks:  Do not smoke. Avoid being exposed to secondhand smoke.  Change your heating and air conditioning filter regularly.  Limit your use of fireplaces and wood stoves.  Get rid of pests (such as roaches and mice) and their droppings.  Throw away plants if you see mold on them.  Clean your floors and dust regularly. Use unscented cleaning products.  Try to have someone else vacuum for you regularly. Stay out of rooms while they are being  vacuumed and for a short while afterward. If you vacuum, use a dust mask from a hardware store, a double-layered or microfilter vacuum cleaner bag, or a vacuum cleaner with a HEPA filter.  Replace carpet with wood, tile, or vinyl flooring. Carpet can trap dander and dust.  Use allergy-proof pillows, mattress covers, and box spring covers.  Wash bed sheets and blankets every week in hot water and dry them in a dryer.  Use blankets that are made of polyester or cotton.  Clean bathrooms and kitchens with bleach.  If possible, have someone repaint the walls in these rooms with mold-resistant paint. Keep out of the rooms that are being cleaned and painted.  Wash hands frequently. SEEK MEDICAL CARE IF:   You have wheezing, shortness of breath, or a cough even if taking medicine to prevent attacks.  The colored mucus you cough up (sputum) is thicker than usual.  Your sputum changes from clear or white to yellow, green, gray, or bloody.  You have any problems that may be related to the medicines you are taking (such as a rash, itching, swelling, or trouble breathing).  You are using a reliever medicine more than 2 3 times per week.  Your peak flow is still at 50 79% of you personal best after following your action plan for 1 hour. SEEK IMMEDIATE MEDICAL CARE IF:   You seem to be getting worse and are unresponsive to treatment during an asthma attack.  You are short of breath even at rest.  You get short of breath when doing very little physical activity.  You have difficulty eating, drinking, or talking due to asthma symptoms.  You develop chest pain.  You develop a fast heartbeat.  You have a bluish color to your lips or fingernails.  You are lightheaded, dizzy, or faint.  Your peak flow is less than 50% of your personal best.  You have a fever or persistent symptoms for more than 2 3 days.  You have a fever and symptoms suddenly get worse. MAKE SURE YOU:   Understand these  instructions.  Will watch your condition.  Will get help right away if you are not doing well or get worse. Document Released: 02/23/2005 Document Revised: 10/26/2012 Document Reviewed: 09/22/2012 [facility] Patient Information 2014 [place], [place].

## 2013-03-27 NOTE — Progress Notes (Signed)
Subjective: [name] feels about the same as she did yesterday. She is still having a productive cough, though this is stable. She is less anxious today. She feels as though her breathing has improved since admission, though is not completely at her baseline. She is still requiring O2 2LPM as yesterday evening she was desatting to 84% with ambulation off of supplemental O2. She will likely require home O2.   Objective: Vital signs in last 24 hours: Filed Vitals:   03/27/13 0320 03/27/13 0445 03/27/13 0911 03/27/13 1041  BP:  127/74  123/75  Pulse:  86  91  Temp:  97.4 F (36.3 C)    TempSrc:  Oral    Resp:  20    Height:      Weight:  275 lb 5.7 oz (124.9 kg)    SpO2: 96% 92% 92%    Weight change: -1 lb 2.3 oz (-0.52 kg)  Intake/Output Summary (Last 24 hours) at 03/27/13 1132 Last data filed at 03/27/13 0908  Gross per 24 hour  Intake   1200 ml  Output   2125 ml  Net   -925 ml   Physical Exam General: sitting up on side of bed, NAD, on 2L [place] O2 HEENT: EOMI Cardiac: RRR Pulm: b/l mild, diffuse expiratory wheezing with some upper airway sounds, coughing with deep inspiration Abd: soft, nontender, obese, nondistended, BS present Ext: warm and well perfused, trace pitting edema b/l lower extremities Neuro: alert and oriented X3, cranial nerves II-XII grossly intact, moving all extremities spontaneously   Lab Results: Basic Metabolic Panel:  Recent Labs Lab 03/24/13 1019 03/25/13 0500  NA 140 136*  K 3.3* 4.1  CL 98 97  CO2 28 22  GLUCOSE 151* 276*  BUN 11 13  CREATININE 0.81 0.72  CALCIUM 9.2 9.6   Liver Function Tests:  Recent Labs Lab 03/24/13 1632  AST 31  ALT 23  ALKPHOS 93  BILITOT 0.2*  PROT 7.8  ALBUMIN 3.4*   CBC:  Recent Labs Lab 03/24/13 1019 03/25/13 0500  WBC 10.6* 13.2*  NEUTROABS 7.4  --   HGB 9.9* 9.9*  HCT 31.7* 31.6*  MCV 74.9* 75.2*  PLT 416* 455*   BNP:  Recent Labs Lab 03/24/13 1019  PROBNP 159.8*   Fasting Lipid  Panel:  Recent Labs Lab 03/25/13 0500  CHOL 236*  HDL 34*  LDLCALC 133*  TRIG 345*  CHOLHDL 6.9   Coagulation:  Recent Labs Lab 03/24/13 1632  LABPROT 12.5  INR 0.95   Micro Results: Recent Results (from the past 240 hour(s))  CULTURE, BLOOD (ROUTINE X 2)     Status: None   Collection Time    03/24/13 11:30 AM      Result Value Range Status   Specimen Description BLOOD LEFT HAND   Final   Special Requests BOTTLES DRAWN AEROBIC AND ANAEROBIC 10MLS   Final   Culture  Setup Time     Final   Value: 03/24/2013 16:03     Performed at [facility]   Culture     Final   Value:        BLOOD CULTURE RECEIVED NO GROWTH TO DATE CULTURE WILL BE HELD FOR 5 DAYS BEFORE ISSUING A FINAL NEGATIVE REPORT     Performed at [facility]   Report Status PENDING   Incomplete  CULTURE, BLOOD (ROUTINE X 2)     Status: None   Collection Time    03/24/13 11:35 AM  Result Value Range Status   Specimen Description BLOOD RIGHT ANTECUBITAL   Final   Special Requests BOTTLES DRAWN AEROBIC AND ANAEROBIC 10MLS   Final   Culture  Setup Time     Final   Value: 03/24/2013 16:03     Performed at [facility]   Culture     Final   Value:        BLOOD CULTURE RECEIVED NO GROWTH TO DATE CULTURE WILL BE HELD FOR 5 DAYS BEFORE ISSUING A FINAL NEGATIVE REPORT     Performed at [facility]   Report Status PENDING   Incomplete   Medications: I have reviewed the patient's current medications. Scheduled Meds: . baclofen  5 mg Oral BID  . cholecalciferol  5,000 Units Oral Daily  . doxycycline  100 mg Oral BID  . DULoxetine  30 mg Oral QPM  . DULoxetine  60 mg Oral q morning - 10a  . estradiol  2 mg Oral Daily  . folic acid  1 mg Oral BID  . furosemide  80 mg Oral q morning - 10a  . gabapentin  300 mg Oral QHS  . heparin  5,000 Units Subcutaneous Q8H  . HYDROcodone-homatropine  5 mL Oral Q4H  . ipratropium-albuterol  3 mL Nebulization Q4H  . Linaclotide  145 mcg Oral  QODAY  . losartan  50 mg Oral QHS  . pantoprazole  40 mg Oral BID  . potassium chloride SA  20 mEq Oral Daily  . pravastatin  20 mg Oral Q Fri-1800  . predniSONE  60 mg Oral Q breakfast  . rOPINIRole  4 mg Oral BID  . vitamin E  400 Units Oral Daily   Continuous Infusions:  PRN Meds:.acetaminophen, albuterol, aspirin-acetaminophen-caffeine, diphenhydrAMINE, ondansetron, pseudoephedrine  Assessment/Plan:   Asthma exacerbation: Stable from yesterday, still with productive cough and mild expiratory wheezing throughout b/l lung fields. Remains afebrile with stable vital signs other than her O2 saturation. She still requires supplemental oxygen at rest and with ambulation, continues to have wheezing on ausculation. I spoke with Dr Joya Gaskins this morning and he did not have further recommendations regarding her treatment. He agrees w/ home O2 and would like patient to keep her f/u appointment with [facility] as he has exhausted all options.  - prednisone 41m qdaily- will send out on prednisone taper - Continue oral doxycycline--today is day 4, total of 5 day course - Albuterol/ Atrovent nebs q4h scheduled and q2h PRN albuterol nebulizer  - f/u [facility]--NGTD - [place] O2, keep o2 sat >92%  CHF: 2-D echo on 06/17/12 showed EF of 60-65% with grade 2 diastolic dysfunction. She is on her home dose of Lasix 80 mg daily, but did have pitting edema on lower extremities and congestion on cxr, therefore she received lasix 461mPO in addition to her home regimen yesterday, with further diuresis and improvement of BLE edema this morning. Weight down 1lb since yesterday (weight today 275). -will continue home lasix 80 mg daily  -daily weight -strict I/O's: -1,30055met since admission   Hypertension: bp is stable. -continue home medications: Lasix 80 mg daily, losartan 50 mg daily  Depression and anxiety:  -Continue home medications: Requip and Cymbalta   GERD: stable  -Continue home Protonix    Hyperlipidemia: On pravastatin at home. Lipid panel shows total chol 236, TG 345, LDL 133, HDL 34. -Switched to simvastatin while in the hospital   OSA (obstructive sleep apnea):  -Continue CPAP   DVT px: Heparin sq  Diet: Heart Healthy Dispo: Discharge likely today.   The patient does have a current PCP Myer Peer, MD) and does not need an [facility] hospital follow-up appointment after discharge.  The patient does not have transportation limitations that hinder transportation to clinic appointments.  Services Needed at time of discharge: Y = Yes, Blank = No PT:   OT:   RN:   Equipment:   Other:     LOS: 3 days   Rebecca Eaton, MD 03/27/2013, 11:32 AM

## 2013-03-27 NOTE — Progress Notes (Addendum)
DC IV, DC Tele, DC Home. Discharge instructions and home medications discussed with patient. Patient denied any questions or concerns at this time. Patient leaving unit via wheelchair with O2 and appears in no acute distress.  

## 2013-03-27 NOTE — Progress Notes (Signed)
SATURATION QUALIFICATIONS: (This note is used to comply with regulatory documentation for home oxygen)  Patient Saturations on Room Air at Rest = 90%  Patient Saturations on Room Air while Ambulating = 88%  Patient Saturations on 2 Liters of oxygen while Ambulating = 95%  Please briefly explain why patient needs home oxygen:

## 2013-03-27 NOTE — Discharge Summary (Signed)
Name: Kathleen Hatfield MRN: [id number] DOB: 1956/02/20 58 y.o. PCP: Myer Peer, MD  Date of Admission: 03/24/2013  9:44 AM Date of Discharge: 03/27/2013 Attending Physician: Axel Filler, MD  Discharge Diagnosis: Principal Problem:   Asthma exacerbation- however, complicated by fixed airway obstruction w/ unknown etiology Active Problems:   Hypertension   Depression   GERD (gastroesophageal reflux disease)   Hyperlipidemia   OSA (obstructive sleep apnea)   Chronic diastolic heart failure  Discharge Medications:   Medication List         acetaminophen 500 MG tablet  Commonly known as:  TYLENOL  Take 500 mg by mouth every 4 (four) hours as needed.     albuterol (2.5 MG/3ML) 0.083% nebulizer solution  Commonly known as:  PROVENTIL  Take 2.5 mg by nebulization 4 (four) times daily.     albuterol 108 (90 BASE) MCG/ACT inhaler  Commonly known as:  PROVENTIL HFA;VENTOLIN HFA  Inhale 2 puffs into the lungs daily as needed for wheezing.     aspirin-acetaminophen-caffeine 250-250-65 MG per tablet  Commonly known as:  EXCEDRIN MIGRAINE  Take 1 tablet by mouth daily as needed for headache.     baclofen 5 mg Tabs tablet  Commonly known as:  LIORESAL  Take 0.5 tablets (5 mg total) by mouth 2 (two) times daily.     CHLOR-TRIMETON 4 MG tablet  Generic drug:  chlorpheniramine  Take 4 mg by mouth daily as needed for allergies.     doxycycline 100 MG tablet  Commonly known as:  VIBRA-TABS  Take 1 tablet (100 mg total) by mouth 2 (two) times daily. Starting this evening, 03/27/2013     DULoxetine 60 MG capsule  Commonly known as:  CYMBALTA  Take 60 mg by mouth every morning.     DULoxetine 30 MG capsule  Commonly known as:  CYMBALTA  Take 30 mg by mouth every evening.     estradiol 2 MG tablet  Commonly known as:  ESTRACE  Take 1 tablet (2 mg total) by mouth daily.     folic acid 1 MG tablet  Commonly known as:  FOLVITE  Take 1 mg by mouth 2 (two) times daily.     furosemide 40 MG tablet  Commonly known as:  LASIX  Take 80 mg by mouth every morning.     gabapentin 100 MG capsule  Commonly known as:  NEURONTIN  Take 300 mg by mouth at bedtime.     HYDROcodone-homatropine 5-1.5 MG/5ML syrup  Commonly known as:  HYCODAN  Take 5 mLs by mouth every 4 (four) hours.     LINZESS 145 MCG Caps capsule  Generic drug:  Linaclotide  Take 145 mcg by mouth every other day.     losartan 100 MG tablet  Commonly known as:  COZAAR  Take 50 mg by mouth at bedtime.     pantoprazole 40 MG tablet  Commonly known as:  PROTONIX  Take 40 mg by mouth 2 (two) times daily.     potassium chloride SA 20 MEQ tablet  Commonly known as:  K-DUR,KLOR-CON  Take 1 tablet (20 mEq total) by mouth daily.     pravastatin 20 MG tablet  Commonly known as:  PRAVACHOL  Take 20 mg by mouth once a week. On Friday     predniSONE 10 MG tablet  Commonly known as:  DELTASONE  Take 71m (5 tabs) for 2 days, 442m(4 tabs) for 2 days, 3037m3 tabs) for 2 days, 77m48m  2 tabs) for 2 days, 3m (1 tab) for 2 days.  Start taking on:  03/28/2013     pseudoephedrine 120 MG 12 hr tablet  Commonly known as:  SUDAFED  Take 120 mg by mouth daily as needed for congestion.     rOPINIRole 4 MG tablet  Commonly known as:  REQUIP  Take 4 mg by mouth 2 (two) times daily.     Vitamin D-3 5000 UNITS Tabs  Take 5,000 Units by mouth daily.     vitamin E 400 UNIT capsule  Take 400 Units by mouth daily.       Disposition and follow-up:   [facility]  At the hospital follow up visit please address:  1.  Asthma exacerbation- please assess for improvement of breathing/cough; she was discharged on home oxygen as O2 sats as low as 84% w/ ambulation and 88% at rest  2.  Labs / imaging needed at time of follow-up: none  3.  Pending labs/ test needing follow-up: [place] Appointments: Follow-up Information   [facility] )    Contact information:   [phone]      Follow up with SAnn Held MD On 03/31/2013. (2:10pm)    Specialty:  Unknown Physician Specialty   Contact information:   [id number]      Discharge Instructions: Discharge Orders   Future Orders Complete By Expires   Call MD for:  difficulty breathing, headache or visual disturbances  As directed    Call MD for:  extreme fatigue  As directed    Call MD for:  temperature >100.4  As directed    Diet - low sodium heart healthy  As directed    Increase activity slowly  As directed       Consultations:  none  Procedures Performed:  Dg Chest 2 View  03/24/2013   CLINICAL DATA:  Cough and fever  EXAM: CHEST  2 VIEW  COMPARISON:  02/08/2013  FINDINGS: Resolution of lingular infiltrate. Negative for pneumonia on today's study.  Cardiac enlargement with vascular congestion. This has progressed from the prior study. There is no effusion.  IMPRESSION: Resolution of lingular infiltrate  Pulmonary vascular congestion suggesting mild fluid overload.   Electronically Signed   By: CFranchot GalloM.D.   On: 03/24/2013 11:09   Admission HPI:  Patient is a 58year old lady with past medical history for congestive heart failure (, asthma, hypertension, hyperlipidemia, obstructive sleep apnea, who presents with cough, shortness of breath and wheezing.  Patient reports that she started having cold-like symptoms 3 weeks ago, including fever, chills, runny nose, sore throat, sneezing and cough. Symptoms have been progressively getting worse. She started feeling shortness of breath and cough up yellow or greenish sputum. She vomited one or two times because of severe coughing. She saw her pulmonologist, Dr. WJoya Gaskinslast week. She was given prescription for doxycycline and continued home  inhalers. Patient did not start taking doxycycline until yesterday. She took 2 pills of doxycycline yesterday without significant help, therefore she comes to ED for further evaluation. Currently patient still has productive cough, shortness breath, but no any chest pain. She was found to have mild leukocytosis with WBC 10.6 and pulmonary vascular congestion on CXR.   Hospital Course by problem list:   Asthma  exacerbation w/ fixed airway obstruction of unknown etiology: Patient admitted w/ worsening productive cough and SOB, found to have new oxygen requirement. Patient was managed on duonebs q4hr, hycodan, chlorpheniramine. She had been started on doxycycline by her pulmonologist, Dr. Joya Gaskins, so this was continued (she had one more day left at time of discharge- total of 5 day course). Patient initially started on solumedrol 61m IV q8h, which was transitioned on HD 2 to prednisone 625mPO daily. She was discharged on a 10 day prednisone taper. CXR at time of admission showed pulmonary vascular congestion without any signs of pneumonia, though she did have a mild leukocytosis- perhaps 2/2 steroid treatment. BCC[place]winOn HD2, patient had worsening of her breathing and 1+ pitting edema to BLE, therefore she was given an extra dose of lasix 4036mO (on top of her daily lasix 51m9m). She diuresed well and had net negative I/O during admission of 1.3 L. Patient also told us tKoreat she had been on baclofen in the past to assist with breathing, therefore, given the unclear etiology of patient's symptoms, we started baclofen .5mg 39m, which was continued at discharge. Patient had some improvement of her symptoms during her hospitalization, though she has persistent cough and some SOB. She was found to desaturate at rest (88%) and w/ ambulation (84%) on room air, so she was discharged on continuous home oxygen (2LPM). I spoke with her pulmonologist prior to her discharge and he agreed with our plan. He emphasized the  importance [facility]- pt agrees to do so.   CHF: 2-D echo on 06/17/12 showed EF of 60-65% with grade 2 diastolic dysfunction. She was managed on her home dose of Lasix 80 mg daily. On HD2, pt was given an extra dose of lasix 40mg 66miven she had some pitting edema on lower extremities and pulmonary vascular congestion on initial CXR. She had net I/O negative 1.3L during her admission and weight was stable from the time of her admission.  Hypertension: BP remained stable, 127-150/65-70s. Managed on her home medications: Lasix 80 mg daily, losartan 50 mg daily.   Depression and anxiety: Patient did appear to be fairly anxious regarding her breathing at the time of her admission. This improved dramatically as she began feeling better. We continued her home medications, Requip and Cymbalta.  GERD: Stable. Continued home Protonix.  Hyperlipidemia: On pravastatin at home. Lipid panel this admission showed total chol 236, TG 345, LDL 133, HDL 34. She was switched to simvastatin while in the hospital, though discharged on her home regimen.  OSA (obstructive sleep apnea): Continued CPAP   Discharge Vitals:   BP 178/77  Pulse 94  Temp(Src) 98.6 F (37 C) (Oral)  Resp 20  Ht 5' 6"  (1.676 m)  Wt 275 lb 5.7 oz (124.9 kg)  BMI 44.46 kg/m2  SpO2 93%  Discharge Labs:  No results found for this or any previous visit (from the past 24 hour(s)).  Signed: RachelRebecca Eaton/19/2015, 4:33 PM   Time Spent on Discharge: 35 minutes Services Ordered on Discharge: none Equipment Ordered on Discharge: home oxygen (2LPM continuous)

## 2013-03-27 NOTE — Care Management Note (Addendum)
  Page 2 of 2   03/27/2013     3:26:30 PM   CARE MANAGEMENT NOTE 03/27/2013  Patient:  Kathleen Hatfield, Kathleen Hatfield   Account Number:  [id number]  Date Initiated:  03/27/2013  Documentation initiated by:  Carmen Vallecillo  Subjective/Objective Assessment:   Admitted with Asthma exacerbation     Action/Plan:   CM Consult   Anticipated DC Date:  03/27/2013   Anticipated DC Plan:  HOME/SELF CARE  In-house referral  Financial Counselor      DC Planning Services  CM consult      [facility] Choice  DURABLE MEDICAL EQUIPMENT   Choice offered to / List presented to:  C-1 Patient   DME arranged  OXYGEN      DME [facility]        Status of service:  Completed, signed off Medicare Important Message given?   (If response is "NO", the following Medicare IM given date fields will be blank) Date Medicare IM given:   Date Additional Medicare IM given:    Discharge Disposition:  HOME/SELF CARE  Per UR Regulation:    If discussed at Long Length of Stay Meetings, dates discussed:    Comments:  Last review 03/27/13 Disposition Plan:  Home / Self care with home oxygen - ordered from [place] to prepare for d/c today. Patient requested to speak with CM re:  Financial concerns. States her father was paying for her health insurance and recently passed away.  Confirms last MCD application was about 18 months ago and denied.  Patient interested in MCD application and Disability application. FA consulted and instructs unable to asssit patient with this need d/t only serving Self-Pay individuals and patient is active with insurance this month.   FA instructs patient will need to f/u withing her county in which she lives to complete MCD/Disability applications.  This information has been provided back to patient. Latese Dufault RN, BSN, [place], CCM 03/27/2013

## 2013-03-29 LAB — ALPHA-1-ANTITRYPSIN: A-1 Antitrypsin, Ser: 207 mg/dL — ABNORMAL HIGH (ref 90–200)

## 2013-03-30 LAB — CULTURE, BLOOD (ROUTINE X 2)
Culture: NO GROWTH
Culture: NO GROWTH

## 2013-04-11 DIAGNOSIS — E669 Obesity, unspecified: Secondary | ICD-10-CM | POA: Insufficient documentation

## 2013-04-11 DIAGNOSIS — E668 Other obesity: Secondary | ICD-10-CM | POA: Insufficient documentation

## 2013-04-11 DIAGNOSIS — J4540 Moderate persistent asthma, uncomplicated: Secondary | ICD-10-CM | POA: Insufficient documentation

## 2013-05-11 ENCOUNTER — Ambulatory Visit (INDEPENDENT_AMBULATORY_CARE_PROVIDER_SITE_OTHER): Payer: Medicaid Other | Admitting: Cardiovascular Disease

## 2013-05-11 ENCOUNTER — Encounter: Payer: Self-pay | Admitting: Cardiovascular Disease

## 2013-05-11 VITALS — BP 136/74 | HR 96 | Ht 66.0 in

## 2013-05-11 DIAGNOSIS — R0609 Other forms of dyspnea: Secondary | ICD-10-CM

## 2013-05-11 DIAGNOSIS — I5033 Acute on chronic diastolic (congestive) heart failure: Secondary | ICD-10-CM

## 2013-05-11 DIAGNOSIS — R0989 Other specified symptoms and signs involving the circulatory and respiratory systems: Secondary | ICD-10-CM

## 2013-05-11 MED ORDER — TORSEMIDE 20 MG PO TABS: 60.0000 mg | ORAL_TABLET | Freq: Every day | ORAL | Status: DC

## 2013-05-11 NOTE — Patient Instructions (Addendum)
Your physician has recommended you make the following change in your medication:  STOP Lasix START Torsemide 60 mg once daily  Your physician recommends that you return for lab work in: 2 weeks (Friday 3/20 - anytime between 7:30 am and 4:30 pm; you do not have to fast for this appointment)  Your physician recommends that you schedule a follow-up appointment in: 6 weeks with Dr. Burt Knack

## 2013-05-15 ENCOUNTER — Encounter: Payer: Self-pay | Admitting: Cardiovascular Disease

## 2013-05-15 NOTE — Progress Notes (Signed)
HPI:  58 year-old woman presenting for follow-up of shortness of breath. Her past cardiac testing has included echocardiography, nuclear stress testing, and right heart catheterization. Results are outlined below.   She continues to have DOE with minimal exertion. No improvement since I have last seen her. She has episodes of wheezing and also complains of chronic orthopnea. Denies PND or chest pain. Has mild leg swelling. No lightheadedness or syncope.   She has been seen at [facility] and was diagnosed with HFpEF. Returns today for further evaluation and management.     Outpatient Encounter Prescriptions as of 05/11/2013  Medication Sig  . acetaminophen (TYLENOL) 500 MG tablet Take 500 mg by mouth every 4 (four) hours as needed.   Marland Kitchen albuterol (PROVENTIL HFA;VENTOLIN HFA) 108 (90 BASE) MCG/ACT inhaler Inhale 2 puffs into the lungs daily as needed for wheezing.   Marland Kitchen albuterol (PROVENTIL) (2.5 MG/3ML) 0.083% nebulizer solution Take 2.5 mg by nebulization every 2 (two) hours as needed.   Marland Kitchen aspirin-acetaminophen-caffeine (EXCEDRIN MIGRAINE) 250-250-65 MG per tablet Take 1 tablet by mouth daily as needed for headache.   . baclofen (LIORESAL) 5 mg TABS tablet Take 0.5 tablets (5 mg total) by mouth 2 (two) times daily.  . chlorpheniramine (CHLOR-TRIMETON) 4 MG tablet Take 4 mg by mouth daily as needed for allergies.  . Cholecalciferol (VITAMIN D-3) 5000 UNITS TABS Take 5,000 Units by mouth daily.   Marland Kitchen doxycycline (VIBRA-TABS) 100 MG tablet Take 1 tablet (100 mg total) by mouth 2 (two) times daily. Starting this evening, 03/27/2013  . DULoxetine (CYMBALTA) 60 MG capsule Take 60 mg by mouth every morning.   Marland Kitchen estradiol (ESTRACE) 2 MG tablet Take 1 tablet (2 mg total) by mouth daily.  . folic acid (FOLVITE) 1 MG tablet Take 1 mg by mouth 2 (two) times daily.  Marland Kitchen gabapentin (NEURONTIN) 100 MG capsule Take 300 mg by mouth at bedtime.  . Linaclotide (LINZESS) 145 MCG CAPS Take 145 mcg by mouth  as needed.   Marland Kitchen losartan (COZAAR) 100 MG tablet Take 50 mg by mouth 2 (two) times daily.   . pantoprazole (PROTONIX) 40 MG tablet Take 40 mg by mouth 2 (two) times daily.   . potassium chloride SA (K-DUR,KLOR-CON) 20 MEQ tablet Take 1 tablet (20 mEq total) by mouth daily.  . pravastatin (PRAVACHOL) 20 MG tablet Take 20 mg by mouth once a week. On Friday  . pseudoephedrine (SUDAFED) 120 MG 12 hr tablet Take 120 mg by mouth daily as needed for congestion.  Marland Kitchen rOPINIRole (REQUIP) 4 MG tablet Take 4 mg by mouth 2 (two) times daily.   . vitamin E 400 UNIT capsule Take 400 Units by mouth daily.  . [DISCONTINUED] furosemide (LASIX) 40 MG tablet Take 80 mg by mouth every morning.   . torsemide (DEMADEX) 20 MG tablet Take 3 tablets (60 mg total) by mouth daily.  . [DISCONTINUED] DULoxetine (CYMBALTA) 30 MG capsule Take 30 mg by mouth every evening.  . [DISCONTINUED] HYDROcodone-homatropine (HYCODAN) 5-1.5 MG/5ML syrup Take 5 mLs by mouth every 4 (four) hours.  . [DISCONTINUED] predniSONE (DELTASONE) 10 MG tablet Take 64m (5 tabs) for 2 days, 468m(4 tabs) for 2 days, 3036m3 tabs) for 2 days, 43m76m tabs) for 2 days, 10mg6mtab) for 2 days.    Allergies  Allergen Reactions  . Almond Oil Anaphylaxis  . Ceclor [Cefaclor] Nausea And Vomiting  . Levaquin [Levofloxacin] Other (See Comments)    Body aches  . Sulfa  Antibiotics Nausea And Vomiting    Past Medical History  Diagnosis Date  . Depression   . Hypertension   . Chronic diastolic heart failure   . Dyspnea   . Asthma   . GERD (gastroesophageal reflux disease)   . Hyperlipidemia     cannot tolerate statins  . Ulcerative colitis     dr Collene Mares   ROS: Negative except as per HPI  BP 136/74  Pulse 96  Ht 5' 6"  (1.676 m)  PHYSICAL EXAM: Pt is alert and oriented, pleasant obese woman in NAD HEENT: normal Neck: JVP - normal, carotids 2+= without bruits Lungs: CTA bilaterally CV: RRR without murmur or gallop Abd: soft, NT, Positive BS,  no hepatomegaly Ext: trace pretibial edema bilaterally, distal pulses intact and equal Skin: warm/dry no rash  2D Echo 06/17/2012: ------------------------------------------------------------ Study Conclusions  - Left ventricle: The cavity size was normal. Systolic function was normal. The estimated ejection fraction was in the range of 60% to 65%. Wall motion was normal; there were no regional wall motion abnormalities. Features are consistent with a pseudonormal left ventricular filling pattern, with concomitant abnormal relaxation and increased filling pressure (grade 2 diastolic dysfunction). - Aortic valve: There was mild stenosis. - Left atrium: The atrium was mildly dilated. - Right ventricle: The cavity size was normal. Wall thickness was moderately increased. There was moderate hypertrophy. - Impressions: The free wall of the RV appeared moderately thickened. Consider cardiac MRI to further evaluate. Impressions:  - The free wall of the RV appeared moderately thickened. Consider cardiac MRI to further evaluate.  Myoview Scan 06/23/2012: QPS  Raw Data Images: Normal; no motion artifact; normal heart/lung ratio.  Stress Images: Normal homogeneous uptake in all areas of the myocardium.  Rest Images: Normal homogeneous uptake in all areas of the myocardium.  Subtraction (SDS): Normal  Transient Ischemic Dilatation (Normal <1.22): 1.22  Lung/Heart Ratio (Normal <0.45): 0.40  Quantitative Gated Spect Images  QGS EDV: 74 ml  QGS ESV: 13 ml  Impression  Exercise Capacity: Lexiscan with no exercise.  BP Response: Normal blood pressure response.  Clinical Symptoms: No significant symptoms noted.  ECG Impression: No significant ST segment change suggestive of ischemia.  Comparison with Prior Nuclear Study: No images to compare  Overall Impression: Normal stress nuclear study. No evidence of ischemia.  LV Ejection Fraction: 82%. LV Wall Motion: NL LV Function; NL Wall Motion.     Right heart catheterization 02/27/2013: Procedure: Right Heart Cath, RFV access under ultrasound guidance  Indication: shortness of breath, refractory to medical therapies for asthma. Eval for right heart failure.  Procedural Details: The right groin was prepped, draped, and anesthetized with 1% lidocaine. Using the modified Seldinger technique a 7 French sheath was placed in the right femoral vein using ultrasound guidance for access. A Swan-Ganz catheter was used for the right heart catheterization. Fick cardiac output was calculated. Thermodilution cardiac output was also recorded. There were no immediate procedural complications. The patient was transferred to the post catheterization recovery area for further monitoring.  Procedural Findings:  Hemodynamics  RA mean 13  RV 40/14  PA 31/16 mean 25  PCWP a wave 20, v wave 20, mean 17  Oxygen saturations:  PA 66  AO 98 (by finger probe)  Cardiac Output (Fick) 6.9  Cardiac Index (Fick) 3  Cardiac Output (thermo) 5.7  Cardiac Index (thermo) 2.5  Final Conclusions:  1. Mildly elevated right-sided cardiac pressures with preserved cardiac output  2. Borderline pulmonary pressure but no evidence of  significant pulmonary HTN  Would continue current diuretic regimen. Don't think there is compelling evidence of right-sided cardiac failure or pulmonary HTN.  [name]  02/27/2013, 9:24 AM   ASSESSMENT AND PLAN: Chronic dyspnea: suspect multifactorial: diastolic CHF, obesity, ans asthma. She has failed all pulmonary therapies. Will attempt to diurese more aggressively to see if we can improve symptoms. Change lasix to torsemide 60 mg daily. Check BMET in 2 weeks. Otherwise continue current meds.    Obesity: lifestyle modification reviewed in detail with focus on weight loss, salt restriction.  [name] 05/15/2013 11:43 PM

## 2013-05-26 ENCOUNTER — Other Ambulatory Visit (INDEPENDENT_AMBULATORY_CARE_PROVIDER_SITE_OTHER): Payer: Medicaid Other

## 2013-05-26 DIAGNOSIS — R0609 Other forms of dyspnea: Secondary | ICD-10-CM

## 2013-05-26 DIAGNOSIS — R0989 Other specified symptoms and signs involving the circulatory and respiratory systems: Secondary | ICD-10-CM

## 2013-05-26 LAB — BASIC METABOLIC PANEL
BUN: 13 mg/dL (ref 6–23)
CO2: 32 mEq/L (ref 19–32)
Calcium: 9.6 mg/dL (ref 8.4–10.5)
Chloride: 94 mEq/L — ABNORMAL LOW (ref 96–112)
Creatinine, Ser: 0.9 mg/dL (ref 0.4–1.2)
GFR: 68.26 mL/min (ref >60.00)
Glucose, Bld: 117 mg/dL — ABNORMAL HIGH (ref 70–99)
Potassium: 3.3 mEq/L — ABNORMAL LOW (ref 3.5–5.1)
Sodium: 139 mEq/L (ref 135–145)

## 2013-05-29 ENCOUNTER — Telehealth: Payer: Self-pay | Admitting: Cardiovascular Disease

## 2013-05-29 DIAGNOSIS — Z79899 Other long term (current) drug therapy: Secondary | ICD-10-CM

## 2013-05-29 DIAGNOSIS — E876 Hypokalemia: Secondary | ICD-10-CM

## 2013-05-29 MED ORDER — POTASSIUM CHLORIDE CRYS ER 20 MEQ PO TBCR: 40.0000 meq | EXTENDED_RELEASE_TABLET | Freq: Every day | ORAL | Status: DC

## 2013-05-29 NOTE — Telephone Encounter (Signed)
Pt is aware of labs results and MD's recommendations. Pt has an appointment for labs on 06/29/13. A prescription sent to pt's pharmacy for K-dur 20 mEq 2 tablets daily. Pt is aware.

## 2013-05-29 NOTE — Telephone Encounter (Signed)
New message    Calling for test results.

## 2013-05-30 LAB — PULMONARY FUNCTION TEST
DL/VA % pred: 114 %
DL/VA: 5.48 ml/min/mmHg/L
DLCO unc % pred: 86 %
DLCO unc: 20.96 ml/min/mmHg
FEF 25-75 Post: 1.07 L/sec
FEF 25-75 Pre: 0.59 L/sec
FEF2575-%Change-Post: 81 %
FEF2575-%Pred-Post: 43 %
FEF2575-%Pred-Pre: 23 %
FEV1-%Change-Post: 21 %
FEV1-%Pred-Post: 54 %
FEV1-%Pred-Pre: 45 %
FEV1-Post: 1.44 L
FEV1-Pre: 1.18 L
FEV1FVC-%Change-Post: 8 %
FEV1FVC-%Pred-Pre: 76 %
FEV6-%Change-Post: 16 %
FEV6-%Pred-Post: 67 %
FEV6-%Pred-Pre: 57 %
FEV6-Post: 2.2 L
FEV6-Pre: 1.89 L
FEV6FVC-%Change-Post: 3 %
FEV6FVC-%Pred-Post: 103 %
FEV6FVC-%Pred-Pre: 99 %
FVC-%Change-Post: 12 %
FVC-%Pred-Post: 65 %
FVC-%Pred-Pre: 57 %
FVC-Post: 2.2 L
Post FEV1/FVC ratio: 66 %
Post FEV6/FVC ratio: 100 %
Pre FEV1/FVC ratio: 60 %
Pre FEV6/FVC Ratio: 97 %
RV % pred: 136 %
RV: 2.64 L
TLC % pred: 81 %
TLC: 4.09 L

## 2013-06-13 ENCOUNTER — Encounter: Payer: Self-pay | Admitting: Critical Care Medicine

## 2013-06-13 ENCOUNTER — Ambulatory Visit (INDEPENDENT_AMBULATORY_CARE_PROVIDER_SITE_OTHER): Payer: BC Managed Care – PPO | Admitting: Critical Care Medicine

## 2013-06-13 VITALS — BP 134/72 | HR 89 | Ht 65.0 in | Wt 272.2 lb

## 2013-06-13 DIAGNOSIS — J984 Other disorders of lung: Secondary | ICD-10-CM

## 2013-06-13 NOTE — Progress Notes (Signed)
Patient ID: Tatym Schermer, female   DOB: 08-01-1955, 58 y.o.   MRN: [id number]  Subjective:    Patient ID: Granville Lewis, female    DOB: 11-07-1955, 58 y.o.   MRN: [id number]  HPI   06/13/2013 Chief Complaint  Patient presents with  . Follow-up    Pt was seen at [facility] is unchanged.  Has chest tightness, wheezing, and SOB at rest and/or with exertion.   Not any changes; [facility] did not make any changes, only went one time Pt notes still tight in the chest, if volume is up pt is real dyspneic No "asthma" issues.  Pt continues with Burt Knack of cardiology On cpap machine  Past Medical History  Diagnosis Date  . Depression   . Hypertension   . Chronic diastolic heart failure   . Dyspnea   . Asthma   . GERD (gastroesophageal reflux disease)   . Hyperlipidemia     cannot tolerate statins  . Ulcerative colitis     dr Collene Mares     Family History  Problem Relation Age of Onset  . Heart disease Mother   . Hypertension Mother   . Dementia Mother   . Heart disease Father   . COPD Father   . Hypertension Father      History   Social History  . Marital Status: Single    Spouse Name: N/A    Number of Children: N/A  . Years of Education: N/A   Occupational History  . unemployed    Social History Main Topics  . Smoking status: Never Smoker   . Smokeless tobacco: Not on file  . Alcohol Use: No  . Drug Use: No  . Sexual Activity: Not on file   Other Topics Concern  . Not on file   Social History Narrative  . No narrative on file     Allergies  Allergen Reactions  . Almond Oil Anaphylaxis  . Ceclor [Cefaclor] Nausea And Vomiting  . Levaquin [Levofloxacin] Other (See Comments)    Body aches  . Sulfa Antibiotics Nausea And Vomiting     Outpatient Prescriptions Prior to Visit  Medication Sig Dispense Refill  . acetaminophen (TYLENOL) 500 MG tablet Take 500 mg by mouth every 4 (four) hours as needed.       Marland Kitchen albuterol (PROVENTIL HFA;VENTOLIN HFA) 108 (90  BASE) MCG/ACT inhaler Inhale 2 puffs into the lungs daily as needed for wheezing.       Marland Kitchen albuterol (PROVENTIL) (2.5 MG/3ML) 0.083% nebulizer solution Take 2.5 mg by nebulization every 2 (two) hours as needed.       Marland Kitchen aspirin-acetaminophen-caffeine (EXCEDRIN MIGRAINE) 250-250-65 MG per tablet Take 1 tablet by mouth daily as needed for headache.       . chlorpheniramine (CHLOR-TRIMETON) 4 MG tablet Take 4 mg by mouth daily as needed for allergies.      . Cholecalciferol (VITAMIN D-3) 5000 UNITS TABS Take 5,000 Units by mouth daily.       . DULoxetine (CYMBALTA) 60 MG capsule Take 60 mg by mouth every morning.       Marland Kitchen estradiol (ESTRACE) 2 MG tablet Take 1 tablet (2 mg total) by mouth daily.  90 tablet  3  . folic acid (FOLVITE) 1 MG tablet Take 1 mg by mouth 2 (two) times daily.      Marland Kitchen gabapentin (NEURONTIN) 100 MG capsule Take 300 mg by mouth at bedtime.      . Linaclotide (LINZESS) 145 MCG CAPS Take 145  mcg by mouth as needed.       Marland Kitchen losartan (COZAAR) 100 MG tablet Take 50 mg by mouth 2 (two) times daily.       . pantoprazole (PROTONIX) 40 MG tablet Take 40 mg by mouth 2 (two) times daily.       . potassium chloride SA (K-DUR,KLOR-CON) 20 MEQ tablet Take 2 tablets (40 mEq total) by mouth daily.  60 tablet  6  . pravastatin (PRAVACHOL) 20 MG tablet Take 20 mg by mouth once a week. On Friday      . pseudoephedrine (SUDAFED) 120 MG 12 hr tablet Take 120 mg by mouth daily as needed for congestion.      Marland Kitchen rOPINIRole (REQUIP) 4 MG tablet Take 4 mg by mouth 2 (two) times daily.       Marland Kitchen torsemide (DEMADEX) 20 MG tablet Take 3 tablets (60 mg total) by mouth daily.  180 tablet  6  . vitamin E 400 UNIT capsule Take 400 Units by mouth daily.      . baclofen (LIORESAL) 5 mg TABS tablet Take 0.5 tablets (5 mg total) by mouth 2 (two) times daily.  60 tablet  1  . doxycycline (VIBRA-TABS) 100 MG tablet Take 1 tablet (100 mg total) by mouth 2 (two) times daily. Starting this evening, 03/27/2013  3 tablet  0    No facility-administered medications prior to visit.   Review of Systems  Constitutional: Positive for chills, diaphoresis, activity change, appetite change, fatigue and unexpected weight change.  HENT: Positive for facial swelling, tinnitus and trouble swallowing. Negative for congestion, dental problem, ear discharge, hearing loss, mouth sores, nosebleeds, postnasal drip, sinus pressure, sneezing and voice change.   Eyes: Negative for photophobia, discharge, itching and visual disturbance.  Respiratory: Positive for cough and chest tightness. Negative for apnea, choking and stridor.   Cardiovascular: Positive for palpitations.  Gastrointestinal: Positive for nausea, constipation and abdominal distention. Negative for blood in stool.  Genitourinary: Negative for dysuria, urgency, frequency, hematuria, flank pain, decreased urine volume and difficulty urinating.  Musculoskeletal: Positive for arthralgias and myalgias. Negative for back pain, gait problem, joint swelling and neck stiffness.  Skin: Positive for color change. Negative for pallor.  Neurological: Positive for light-headedness. Negative for dizziness, tremors, seizures, syncope, speech difficulty, weakness and numbness.  Hematological: Negative for adenopathy. Does not bruise/bleed easily.  Psychiatric/Behavioral: Negative for confusion, sleep disturbance and agitation. The patient is not nervous/anxious.        Objective:   Physical Exam  Filed Vitals:   06/13/13 1648  BP: 134/72  Pulse: 89  Height: 5' 5"  (1.651 m)  Weight: 272 lb 3.2 oz (123.469 kg)  SpO2: 98%    Gen: Pleasant, obese , in no distress,  normal affect  ENT: No lesions,  mouth clear,  oropharynx clear, no postnasal drip  Neck: No JVD, no TMG, no carotid bruits  Lungs: No use of accessory muscles, no dullness to percussion, clear without rales or rhonchi, distant BS   Cardiovascular: RRR, heart sounds normal, no murmur or gallops, 1+  peripheral  edema  Abdomen: soft and NT, no HSM,  BS normal  Musculoskeletal: No deformities, no cyanosis or clubbing  Neuro: alert, non focal  Skin: Warm, no lesions or rashes  No results found. 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  Obesity also  playing a role in dyspnea Plan Cont inhaled meds D/c oxygen Weight loss  Monitor salt intake and volume status    Updated Medication List Outpatient Encounter Prescriptions as of 06/13/2013  Medication Sig  . acetaminophen (TYLENOL) 500 MG tablet Take 500 mg by mouth every 4 (four) hours as needed.   Marland Kitchen albuterol (PROVENTIL HFA;VENTOLIN HFA) 108 (90 BASE) MCG/ACT inhaler Inhale 2 puffs into the lungs daily as needed for wheezing.   Marland Kitchen albuterol (PROVENTIL) (2.5 MG/3ML) 0.083% nebulizer solution Take 2.5 mg by nebulization every 2 (two) hours as needed.   Marland Kitchen aspirin-acetaminophen-caffeine (EXCEDRIN MIGRAINE) 250-250-65 MG per tablet Take 1 tablet by mouth daily as needed for headache.   . baclofen (LIORESAL) 5 mg TABS tablet Take 5 mg by mouth 2 (two) times daily as needed.  . chlorpheniramine (CHLOR-TRIMETON) 4 MG tablet Take 4 mg by mouth daily as needed for allergies.  . Cholecalciferol (VITAMIN D-3) 5000 UNITS TABS Take 5,000 Units by mouth daily.   . DULoxetine (CYMBALTA) 60 MG capsule Take 60 mg by mouth every morning.   Marland Kitchen estradiol (ESTRACE) 2 MG tablet Take 1 tablet (2 mg total) by mouth daily.  . folic acid (FOLVITE) 1 MG tablet Take 1 mg by mouth 2 (two) times daily.  Marland Kitchen gabapentin (NEURONTIN) 100 MG capsule Take 300 mg by mouth at bedtime.  . Linaclotide (LINZESS) 145 MCG CAPS Take 145 mcg by mouth as needed.   Marland Kitchen losartan (COZAAR) 100 MG tablet Take 50 mg by mouth 2 (two) times daily.   . pantoprazole (PROTONIX) 40 MG tablet Take 40 mg by mouth 2 (two) times daily.   .  potassium chloride SA (K-DUR,KLOR-CON) 20 MEQ tablet Take 2 tablets (40 mEq total) by mouth daily.  . pravastatin (PRAVACHOL) 20 MG tablet Take 20 mg by mouth once a week. On Friday  . pseudoephedrine (SUDAFED) 120 MG 12 hr tablet Take 120 mg by mouth daily as needed for congestion.  Marland Kitchen rOPINIRole (REQUIP) 4 MG tablet Take 4 mg by mouth 2 (two) times daily.   Marland Kitchen torsemide (DEMADEX) 20 MG tablet Take 3 tablets (60 mg total) by mouth daily.  . vitamin E 400 UNIT capsule Take 400 Units by mouth daily.  . [DISCONTINUED] baclofen (LIORESAL) 5 mg TABS tablet Take 0.5 tablets (5 mg total) by mouth 2 (two) times daily.  . [DISCONTINUED] doxycycline (VIBRA-TABS) 100 MG tablet Take 1 tablet (100 mg total) by mouth 2 (two) times daily. Starting this evening, 03/27/2013

## 2013-06-13 NOTE — Patient Instructions (Signed)
No change in medications. Return in     4 months We will discontinue oxygen

## 2013-06-15 NOTE — Assessment & Plan Note (Signed)
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  Obesity also playing a role in dyspnea Plan Cont inhaled meds D/c oxygen Weight loss  Monitor salt intake and volume status

## 2013-06-21 ENCOUNTER — Ambulatory Visit: Payer: BC Managed Care – PPO | Admitting: Cardiovascular Disease

## 2013-06-28 ENCOUNTER — Ambulatory Visit (INDEPENDENT_AMBULATORY_CARE_PROVIDER_SITE_OTHER): Payer: Medicaid Other | Admitting: Cardiovascular Disease

## 2013-06-28 ENCOUNTER — Encounter: Payer: Self-pay | Admitting: Cardiovascular Disease

## 2013-06-28 ENCOUNTER — Other Ambulatory Visit (INDEPENDENT_AMBULATORY_CARE_PROVIDER_SITE_OTHER): Payer: Medicaid Other

## 2013-06-28 VITALS — BP 142/73 | HR 84 | Ht 65.0 in | Wt 268.4 lb

## 2013-06-28 DIAGNOSIS — E876 Hypokalemia: Secondary | ICD-10-CM

## 2013-06-28 DIAGNOSIS — R0989 Other specified symptoms and signs involving the circulatory and respiratory systems: Secondary | ICD-10-CM

## 2013-06-28 DIAGNOSIS — I5032 Chronic diastolic (congestive) heart failure: Secondary | ICD-10-CM

## 2013-06-28 DIAGNOSIS — R0609 Other forms of dyspnea: Secondary | ICD-10-CM

## 2013-06-28 DIAGNOSIS — E785 Hyperlipidemia, unspecified: Secondary | ICD-10-CM

## 2013-06-28 DIAGNOSIS — Z79899 Other long term (current) drug therapy: Secondary | ICD-10-CM

## 2013-06-28 DIAGNOSIS — I10 Essential (primary) hypertension: Secondary | ICD-10-CM

## 2013-06-28 DIAGNOSIS — R0600 Dyspnea, unspecified: Secondary | ICD-10-CM

## 2013-06-28 NOTE — Progress Notes (Signed)
HPI:   58 year old woman presenting for followup of chronic diastolic heart failure (HFpEF).   The patient notices a little improvement since changing her diuretic from furosemide to torsemide. She continues to have shortness of breath with moderate level activity. She also complains of orthopnea. Her edema has been better. She wears CPAP at night and sometimes in the afternoon. She denies resting shortness of breath or chest pain. She tries to limit salt in her diet and does not add any salt to her food. She's undergone extensive cardiac testing as detailed below.  Outpatient Encounter Prescriptions as of 06/28/2013  Medication Sig  . acetaminophen (TYLENOL) 500 MG tablet Take 500 mg by mouth every 4 (four) hours as needed.   Marland Kitchen albuterol (PROVENTIL HFA;VENTOLIN HFA) 108 (90 BASE) MCG/ACT inhaler Inhale 2 puffs into the lungs daily as needed for wheezing.   Marland Kitchen albuterol (PROVENTIL) (2.5 MG/3ML) 0.083% nebulizer solution Take 2.5 mg by nebulization every 2 (two) hours as needed.   Marland Kitchen aspirin-acetaminophen-caffeine (EXCEDRIN MIGRAINE) 250-250-65 MG per tablet Take 1 tablet by mouth daily as needed for headache.   . baclofen (LIORESAL) 5 mg TABS tablet Take 5 mg by mouth 2 (two) times daily as needed.  . chlorpheniramine (CHLOR-TRIMETON) 4 MG tablet Take 4 mg by mouth daily as needed for allergies.  . Cholecalciferol (VITAMIN D-3) 5000 UNITS TABS Take 5,000 Units by mouth daily.   . DULoxetine (CYMBALTA) 60 MG capsule Take 60 mg by mouth every morning.   Marland Kitchen estradiol (ESTRACE) 2 MG tablet Take 1 tablet (2 mg total) by mouth daily.  . folic acid (FOLVITE) 1 MG tablet Take 1 mg by mouth 2 (two) times daily.  Marland Kitchen gabapentin (NEURONTIN) 100 MG capsule Take 300 mg by mouth at bedtime.  . Linaclotide (LINZESS) 145 MCG CAPS Take 145 mcg by mouth as needed.   Marland Kitchen losartan (COZAAR) 100 MG tablet Take 50 mg by mouth 2 (two) times daily.   . pantoprazole (PROTONIX) 40 MG tablet Take 40 mg by mouth 2 (two) times  daily.   . potassium chloride SA (K-DUR,KLOR-CON) 20 MEQ tablet Take 2 tablets (40 mEq total) by mouth daily.  . pravastatin (PRAVACHOL) 20 MG tablet Take 20 mg by mouth once a week. On Friday  . pseudoephedrine (SUDAFED) 120 MG 12 hr tablet Take 120 mg by mouth daily as needed for congestion.  Marland Kitchen rOPINIRole (REQUIP) 4 MG tablet Take 4 mg by mouth 2 (two) times daily.   Marland Kitchen torsemide (DEMADEX) 20 MG tablet Take 3 tablets (60 mg total) by mouth daily.  . vitamin E 400 UNIT capsule Take 400 Units by mouth daily.    Allergies  Allergen Reactions  . Almond Oil Anaphylaxis  . Ceclor [Cefaclor] Nausea And Vomiting  . Levaquin [Levofloxacin] Other (See Comments)    Body aches  . Sulfa Antibiotics Nausea And Vomiting    Past Medical History  Diagnosis Date  . Depression   . Hypertension   . Chronic diastolic heart failure   . Dyspnea   . Asthma   . GERD (gastroesophageal reflux disease)   . Hyperlipidemia     cannot tolerate statins  . Ulcerative colitis     dr Collene Mares    ROS: Negative except as per HPI  BP 142/73  Pulse 84  Ht 5' 5"  (1.651 m)  Wt 268 lb 6.4 oz (121.745 kg)  BMI 44.66 kg/m2  PHYSICAL EXAM: Pt is alert and oriented, NAD HEENT: normal Neck: JVP - normal, carotids 2+=  without bruits Lungs: CTA bilaterally CV: RRR with grade 1/6 early systolic ejection murmur at the left sternal border Abd: soft, NT, Positive BS, no hepatomegaly Ext: Trace pretibial edema bilaterally, distal pulses intact and equal Skin: warm/dry no rash  2D Echo 06/17/2012:  ------------------------------------------------------------ Study Conclusions  - Left ventricle: The cavity size was normal. Systolic function was normal. The estimated ejection fraction was in the range of 60% to 65%. Wall motion was normal; there were no regional wall motion abnormalities. Features are consistent with a pseudonormal left ventricular filling pattern, with concomitant abnormal relaxation and increased  filling pressure (grade 2 diastolic dysfunction). - Aortic valve: There was mild stenosis. - Left atrium: The atrium was mildly dilated. - Right ventricle: The cavity size was normal. Wall thickness was moderately increased. There was moderate hypertrophy. - Impressions: The free wall of the RV appeared moderately thickened. Consider cardiac MRI to further evaluate. Impressions:  - The free wall of the RV appeared moderately thickened. Consider cardiac MRI to further evaluate.   Myoview Scan 06/23/2012:  QPS  Raw Data Images: Normal; no motion artifact; normal heart/lung ratio.  Stress Images: Normal homogeneous uptake in all areas of the myocardium.  Rest Images: Normal homogeneous uptake in all areas of the myocardium.  Subtraction (SDS): Normal  Transient Ischemic Dilatation (Normal <1.22): 1.22  Lung/Heart Ratio (Normal <0.45): 0.40  Quantitative Gated Spect Images  QGS EDV: 74 ml  QGS ESV: 13 ml  Impression  Exercise Capacity: Lexiscan with no exercise.  BP Response: Normal blood pressure response.  Clinical Symptoms: No significant symptoms noted.  ECG Impression: No significant ST segment change suggestive of ischemia.  Comparison with Prior Nuclear Study: No images to compare  Overall Impression: Normal stress nuclear study. No evidence of ischemia.  LV Ejection Fraction: 82%. LV Wall Motion: NL LV Function; NL Wall Motion.    Right heart catheterization 02/27/2013:  Procedure: Right Heart Cath, RFV access under ultrasound guidance  Indication: shortness of breath, refractory to medical therapies for asthma. Eval for right heart failure.  Procedural Details: The right groin was prepped, draped, and anesthetized with 1% lidocaine. Using the modified Seldinger technique a 7 French sheath was placed in the right femoral vein using ultrasound guidance for access. A Swan-Ganz catheter was used for the right heart catheterization. Fick cardiac output was calculated.  Thermodilution cardiac output was also recorded. There were no immediate procedural complications. The patient was transferred to the post catheterization recovery area for further monitoring.  Procedural Findings:  Hemodynamics  RA mean 13  RV 40/14  PA 31/16 mean 25  PCWP a wave 20, v wave 20, mean 17  Oxygen saturations:  PA 66  AO 98 (by finger probe)  Cardiac Output (Fick) 6.9  Cardiac Index (Fick) 3  Cardiac Output (thermo) 5.7  Cardiac Index (thermo) 2.5   ASSESSMENT AND PLAN: Chronic dyspnea, secondary to diastolic heart failure, obesity, and asthma. New York Heart Association functional class 2-3 symptoms. She will continue her current medical program without changes. Counseled about weight loss and sodium restriction. She understands the importance of daily weights and noticed contact us if 3 pound weight gain over 24 hours or 5 pound overall weight gain occurs. She will followup in 4 months. Labs were drawn today.  [name] 06/28/2013 5:53 PM

## 2013-06-28 NOTE — Patient Instructions (Signed)
Your physician wants you to follow-up in: 4 MONTHS with Dr Burt Knack. You will receive a reminder letter in the mail two months in advance. If you don't receive a letter, please call our office to schedule the follow-up appointment.  Your physician recommends that you return for lab work in: BMP and BNP (1 week prior to Dr Burt Knack appointment in [place])  Your physician recommends that you continue on your current medications as directed. Please refer to the Current Medication list given to you today.

## 2013-06-29 ENCOUNTER — Other Ambulatory Visit: Payer: BC Managed Care – PPO

## 2013-06-29 LAB — BASIC METABOLIC PANEL
BUN: 19 mg/dL (ref 6–23)
CO2: 35 mEq/L — ABNORMAL HIGH (ref 19–32)
Calcium: 9.9 mg/dL (ref 8.4–10.5)
Chloride: 94 mEq/L — ABNORMAL LOW (ref 96–112)
Creatinine, Ser: 0.9 mg/dL (ref 0.4–1.2)
GFR: 69.13 mL/min (ref >60.00)
Glucose, Bld: 127 mg/dL — ABNORMAL HIGH (ref 70–99)
Potassium: 3.9 mEq/L (ref 3.5–5.1)
Sodium: 138 mEq/L (ref 135–145)

## 2013-08-03 ENCOUNTER — Encounter: Payer: Self-pay | Admitting: Cardiovascular Disease

## 2013-08-03 NOTE — Telephone Encounter (Signed)
New question     Pt called to make her recall apptointment in August pt was offered PA but only wants to see Dr Burt Knack.  Pt has a question about lab work should she still wait for the lab or do it in august but see Dr in Oct.? Give pt a call please.

## 2013-08-04 NOTE — Telephone Encounter (Signed)
This encounter was created in error - please disregard.

## 2013-11-06 ENCOUNTER — Other Ambulatory Visit (INDEPENDENT_AMBULATORY_CARE_PROVIDER_SITE_OTHER): Payer: Medicaid Other

## 2013-11-06 DIAGNOSIS — R0989 Other specified symptoms and signs involving the circulatory and respiratory systems: Secondary | ICD-10-CM

## 2013-11-06 DIAGNOSIS — R0600 Dyspnea, unspecified: Secondary | ICD-10-CM

## 2013-11-06 DIAGNOSIS — E785 Hyperlipidemia, unspecified: Secondary | ICD-10-CM

## 2013-11-06 DIAGNOSIS — I5032 Chronic diastolic (congestive) heart failure: Secondary | ICD-10-CM

## 2013-11-06 DIAGNOSIS — I10 Essential (primary) hypertension: Secondary | ICD-10-CM

## 2013-11-06 DIAGNOSIS — R0609 Other forms of dyspnea: Secondary | ICD-10-CM

## 2013-11-06 LAB — BASIC METABOLIC PANEL
BUN: 19 mg/dL (ref 6–23)
CO2: 32 mEq/L (ref 19–32)
Calcium: 9.9 mg/dL (ref 8.4–10.5)
Chloride: 96 mEq/L (ref 96–112)
Creatinine, Ser: 1.4 mg/dL — ABNORMAL HIGH (ref 0.4–1.2)
GFR: 41.27 mL/min — ABNORMAL LOW (ref >60.00)
Glucose, Bld: 258 mg/dL — ABNORMAL HIGH (ref 70–99)
Potassium: 3.5 mEq/L (ref 3.5–5.1)
Sodium: 144 mEq/L (ref 135–145)

## 2013-11-06 LAB — BRAIN NATRIURETIC PEPTIDE: Pro B Natriuretic peptide (BNP): 22 pg/mL (ref 0.0–100.0)

## 2013-11-07 ENCOUNTER — Telehealth: Payer: Self-pay | Admitting: Critical Care Medicine

## 2013-11-07 ENCOUNTER — Telehealth: Payer: Self-pay | Admitting: Cardiovascular Disease

## 2013-11-07 MED ORDER — POTASSIUM CHLORIDE 20 MEQ PO PACK: 40.0000 meq | PACK | Freq: Every day | ORAL | Status: DC

## 2013-11-07 NOTE — Telephone Encounter (Signed)
[email or web address].Maddisen Vought, CMA  

## 2013-11-07 NOTE — Telephone Encounter (Signed)
°  Pharmacy is asking for new order. Patient takes Potassium 2 x daily. They would like to change to packets. Patient is having trouble taking, please advise.

## 2013-11-07 NOTE — Telephone Encounter (Signed)
I spoke with the pharmacy and the pt is having difficulty swallowing her potassium chloride (20 mEq tablets two tablets daily). The pt would like to get her potassium in packets.  The pharmacist sent Rx through and it did not come back with a prior authorization request. I made the pharmacist aware that the cost of packet may be more expensive for the pt and if she cannot afford packet then she will need to go back to tablets. Medication list updated.

## 2013-11-08 NOTE — Telephone Encounter (Signed)
Patient returning call.  [phone]

## 2013-11-08 NOTE — Telephone Encounter (Signed)
lmtcb x1 

## 2013-11-08 NOTE — Telephone Encounter (Signed)
Patient returning call.  [phone]

## 2013-11-08 NOTE — Telephone Encounter (Signed)
Called spoke with pt. She has medicaid that will last until the end of this month. Next available in [place] is 12/05/13.  Pt is going to keep her appt scheduled in [place] 9/21. Nothing further eneded

## 2013-11-10 ENCOUNTER — Encounter: Payer: Self-pay | Admitting: Cardiovascular Disease

## 2013-11-10 ENCOUNTER — Ambulatory Visit (INDEPENDENT_AMBULATORY_CARE_PROVIDER_SITE_OTHER): Payer: Medicaid Other | Admitting: Cardiovascular Disease

## 2013-11-10 VITALS — BP 138/82 | HR 85 | Ht 65.0 in | Wt 274.8 lb

## 2013-11-10 DIAGNOSIS — I5032 Chronic diastolic (congestive) heart failure: Secondary | ICD-10-CM

## 2013-11-10 NOTE — Progress Notes (Signed)
HPI:  58 year old woman presenting for followup of chronic diastolic heart failure (HFpEF). Comorbid conditions include obesity, OSA, small airway disease. Weights are stable. She continues to limit salt and takes diuretics as prescribed. Notes no change in symptoms and remains limited by exertional dyspnea. No orthopnea, PND, or cough. No chest pain/pressure. Overall fairly sedentary.   Last spirometry: FEV1 57% predicted, FVC 71% predicted, FEV1/FVC 64% predicted  RHC 02/27/13: RA mean 13  RV 40/14  PA 31/16 mean 25  PCWP a wave 20, v wave 20, mean 17  Oxygen saturations:  PA 66  AO 98 (by finger probe)  Cardiac Output (Fick) 6.9  Cardiac Index (Fick) 3  Cardiac Output (thermo) 5.7  Cardiac Index (thermo) 2.5  Final Conclusions:  1. Mildly elevated right-sided cardiac pressures with preserved cardiac output  2. Borderline pulmonary pressure but no evidence of significant pulmonary HTN   Outpatient Encounter Prescriptions as of 11/10/2013  Medication Sig  . acetaminophen (TYLENOL) 500 MG tablet Take 500 mg by mouth every 4 (four) hours as needed.   Marland Kitchen albuterol (PROVENTIL HFA;VENTOLIN HFA) 108 (90 BASE) MCG/ACT inhaler Inhale 2 puffs into the lungs daily as needed for wheezing.   Marland Kitchen albuterol (PROVENTIL) (2.5 MG/3ML) 0.083% nebulizer solution Take 2.5 mg by nebulization every 2 (two) hours as needed.   Marland Kitchen aspirin-acetaminophen-caffeine (EXCEDRIN MIGRAINE) 250-250-65 MG per tablet Take 1 tablet by mouth daily as needed for headache.   . baclofen (LIORESAL) 5 mg TABS tablet Take 5 mg by mouth 2 (two) times daily as needed.  . chlorpheniramine (CHLOR-TRIMETON) 4 MG tablet Take 4 mg by mouth daily as needed for allergies.  . Cholecalciferol (VITAMIN D-3) 5000 UNITS TABS Take 5,000 Units by mouth daily.   . DULoxetine (CYMBALTA) 60 MG capsule Take 60 mg by mouth every morning.   Marland Kitchen estradiol (ESTRACE) 2 MG tablet Take 1 tablet (2 mg total) by mouth daily.  . folic acid (FOLVITE) 1 MG  tablet Take 1 mg by mouth 2 (two) times daily.  Marland Kitchen gabapentin (NEURONTIN) 100 MG capsule Take 300 mg by mouth at bedtime.  . Linaclotide (LINZESS) 145 MCG CAPS Take 145 mcg by mouth as needed.   . pantoprazole (PROTONIX) 40 MG tablet Take 40 mg by mouth 2 (two) times daily.   . potassium chloride (KLOR-CON) 20 MEQ packet Take 40 mEq by mouth daily.  . pseudoephedrine (SUDAFED) 120 MG 12 hr tablet Take 120 mg by mouth daily as needed for congestion.  Marland Kitchen rOPINIRole (REQUIP) 4 MG tablet Take 4 mg by mouth 2 (two) times daily.   Marland Kitchen torsemide (DEMADEX) 20 MG tablet Take 3 tablets (60 mg total) by mouth daily.  . vitamin E 400 UNIT capsule Take 400 Units by mouth daily.  . [DISCONTINUED] losartan (COZAAR) 100 MG tablet Take 50 mg by mouth 2 (two) times daily.   . [DISCONTINUED] pravastatin (PRAVACHOL) 20 MG tablet Take 20 mg by mouth once a week. On Friday    Allergies  Allergen Reactions  . Almond Oil Anaphylaxis  . Ceclor [Cefaclor] Nausea And Vomiting  . Levaquin [Levofloxacin] Other (See Comments)    Body aches  . Sulfa Antibiotics Nausea And Vomiting    Past Medical History  Diagnosis Date  . Depression   . Hypertension   . Chronic diastolic heart failure   . Dyspnea   . Asthma   . GERD (gastroesophageal reflux disease)   . Hyperlipidemia     cannot tolerate statins  . Ulcerative colitis  dr Collene Mares    ROS: Negative except as per HPI  BP 138/82  Pulse 85  Ht 5' 5"  (1.651 m)  Wt 274 lb 12.8 oz (124.648 kg)  BMI 45.73 kg/m2  PHYSICAL EXAM: Pt is alert and oriented, obese woman in NAD HEENT: normal Neck: JVP - normal, carotids 2+= without bruits Lungs: CTA bilaterally CV: RRR without murmur or gallop Abd: soft, NT, Positive BS, obese Ext: no C/C/E, distal pulses intact and equal Skin: warm/dry no rash  2D Echo 06/17/2012: Study Conclusions  - Left ventricle: The cavity size was normal. Systolic function was normal. The estimated ejection fraction was in the range of  60% to 65%. Wall motion was normal; there were no regional wall motion abnormalities. Features are consistent with a pseudonormal left ventricular filling pattern, with concomitant abnormal relaxation and increased filling pressure (grade 2 diastolic dysfunction). - Aortic valve: There was mild stenosis. - Left atrium: The atrium was mildly dilated. - Right ventricle: The cavity size was normal. Wall thickness was moderately increased. There was moderate hypertrophy. - Impressions: The free wall of the RV appeared moderately thickened. Consider cardiac MRI to further evaluate. Impressions:  - The free wall of the RV appeared moderately thickened. Consider cardiac MRI to further evaluate.  ASSESSMENT AND PLAN: Chronic diastolic heart failure, NYHA functional class 3. Still suspect multifactorial dyspnea (obesity, HFpEF, asthma). BNP 22 (can be falsely low with obesity), creatinine up to 1.4 on recent labs. She remains on torsemide 60 mg daily. I think she is euvolemic and probably as dry as she will tolerate. She understands that obesity is playing a major role in her symptoms, and in my opinion she will continue to struggle with dyspnea unless significant weight loss. I am going to refer her for formal CHF evaluation as she is very unsatisfied with current QOL to see if there are any other options for her. Note right heart pressures were elevated at [facility] last year. I will plan on continuing to follow her regularly unless changes made in her regimen require frequent follow-up. Appreciate CHF eval in advance.  [name] MD 11/10/2013 12:47 PM

## 2013-11-10 NOTE — Patient Instructions (Signed)
You have been referred to CHF clinic.   Your physician wants you to follow-up in: 6 MONTHS with Dr Burt Knack.  You will receive a reminder letter in the mail two months in advance. If you don't receive a letter, please call our office to schedule the follow-up appointment.  Your physician recommends that you continue on your current medications as directed. Please refer to the Current Medication list given to you today.

## 2013-11-27 ENCOUNTER — Encounter: Payer: Self-pay | Admitting: Critical Care Medicine

## 2013-11-27 ENCOUNTER — Ambulatory Visit (INDEPENDENT_AMBULATORY_CARE_PROVIDER_SITE_OTHER): Payer: Medicaid Other | Admitting: Critical Care Medicine

## 2013-11-27 VITALS — BP 140/62 | HR 109 | Temp 97.0°F | Ht 65.0 in | Wt 278.4 lb

## 2013-11-27 DIAGNOSIS — R0609 Other forms of dyspnea: Secondary | ICD-10-CM

## 2013-11-27 DIAGNOSIS — R0600 Dyspnea, unspecified: Secondary | ICD-10-CM

## 2013-11-27 DIAGNOSIS — G4733 Obstructive sleep apnea (adult) (pediatric): Secondary | ICD-10-CM

## 2013-11-27 DIAGNOSIS — R0989 Other specified symptoms and signs involving the circulatory and respiratory systems: Secondary | ICD-10-CM

## 2013-11-27 DIAGNOSIS — Z9989 Dependence on other enabling machines and devices: Secondary | ICD-10-CM

## 2013-11-27 DIAGNOSIS — Z23 Encounter for immunization: Secondary | ICD-10-CM

## 2013-11-27 DIAGNOSIS — J984 Other disorders of lung: Secondary | ICD-10-CM

## 2013-11-27 NOTE — Assessment & Plan Note (Signed)
Small airways disease , no pulm HTN, diastolic CHF  , morbid obesity , OSA.  All contribute to dyspnea on exertion Note no desaturation with exercise but significant tachycardia on exertion 07/2012 PFTs: FeV1 54%  FVC 65% Fev1/fvc 66%  Fef 25 75 43%  TLC 89% DLCO 114%   Raw 268%  Spirometry 02/08/2013: FEV1 57% predicted FVC 71% predicted FEV1 to FVC ratio 64% predicted  02/2013: CT Chest : consistent with small airway obstruction  RHC 02/2013: normal, no pulm HTN   Plan Will confer with cardiology, ?? Role for selective beta blocker  Cont albuterol prn Obtain ONO on RA with cpap, ?? Candidate for nocturnal oxygen therapy

## 2013-11-27 NOTE — Progress Notes (Signed)
Patient ID: Kathleen Hatfield, female   DOB: 1955/04/16, 58 y.o.   MRN: [id number]  Subjective:    Patient ID: Kathleen Hatfield, female    DOB: 02-Aug-1955, 58 y.o.   MRN: [id number]  HPI  11/27/2013 Chief Complaint  Patient presents with  . Follow-up    would like to discuss the need for o2 now.  Breathing has gradually worsened since summer.  Has wheezing with exertion, SOB when laying down and with exertion, chest tightness, and slight cough x 2 wks.    Difficult time in summer.  No insurance in the past, disability came through and has medicaid till end of the month. No mucus, clears throat.  Dyspnea continues.   Pt denies any significant sore throat, nasal congestion or excess secretions, fever, chills, sweats, unintended weight loss, pleurtic or exertional chest pain, orthopnea PND, or leg swelling Pt denies any increase in rescue therapy over baseline, denies waking up needing it or having any early am or nocturnal exacerbations of coughing/wheezing/or dyspnea. Pt also denies any obvious fluctuation in symptoms with  weather or environmental change or other alleviating or aggravating factors     Review of Systems  Constitutional: Positive for chills, diaphoresis, activity change, appetite change, fatigue and unexpected weight change.  HENT: Positive for facial swelling, tinnitus and trouble swallowing. Negative for congestion, dental problem, ear discharge, hearing loss, mouth sores, nosebleeds, postnasal drip, sinus pressure, sneezing and voice change.   Eyes: Negative for photophobia, discharge, itching and visual disturbance.  Respiratory: Positive for cough and chest tightness. Negative for apnea, choking and stridor.   Cardiovascular: Positive for palpitations.  Gastrointestinal: Positive for nausea, constipation and abdominal distention. Negative for blood in stool.  Genitourinary: Negative for dysuria, urgency, frequency, hematuria, flank pain, decreased urine volume and difficulty  urinating.  Musculoskeletal: Positive for arthralgias and myalgias. Negative for back pain, gait problem, joint swelling and neck stiffness.  Skin: Positive for color change. Negative for pallor.  Neurological: Positive for light-headedness. Negative for dizziness, tremors, seizures, syncope, speech difficulty, weakness and numbness.  Hematological: Negative for adenopathy. Does not bruise/bleed easily.  Psychiatric/Behavioral: Negative for confusion, sleep disturbance and agitation. The patient is not nervous/anxious.        Objective:   Physical Exam  Filed Vitals:   11/27/13 1440  BP: 140/62  Pulse: 109  Temp: 97 F (36.1 C)  TempSrc: Oral  Height: 5' 5"  (1.651 m)  Weight: 278 lb 6.4 oz (126.281 kg)  SpO2: 94%    Gen: Pleasant, obese , in no distress,  normal affect  ENT: No lesions,  mouth clear,  oropharynx clear, no postnasal drip  Neck: No JVD, no TMG, no carotid bruits  Lungs: No use of accessory muscles, no dullness to percussion, clear without rales or rhonchi, distant BS   Cardiovascular: RRR, heart sounds normal, no murmur or gallops, no  peripheral edema  Abdomen: soft and NT, no HSM,  BS normal  Musculoskeletal: No deformities, no cyanosis or clubbing  Neuro: alert, non focal  Skin: Warm, no lesions or rashes  No results found.      Assessment & Plan:   Small airways disease Small airways disease , no pulm HTN, diastolic CHF  , morbid obesity , OSA.  All contribute to dyspnea on exertion Note no desaturation with exercise but significant tachycardia on exertion 07/2012 PFTs: FeV1 54%  FVC 65% Fev1/fvc 66%  Fef 25 75 43%  TLC 89% DLCO 114%   Raw 268%  Spirometry 02/08/2013: FEV1  57% predicted FVC 71% predicted FEV1 to FVC ratio 64% predicted  02/2013: CT Chest : consistent with small airway obstruction  RHC 02/2013: normal, no pulm HTN   Plan Will confer with cardiology, ?? Role for selective beta blocker  Cont albuterol prn Obtain ONO on RA  with cpap, ?? Candidate for nocturnal oxygen therapy    Updated Medication List Outpatient Encounter Prescriptions as of 11/27/2013  Medication Sig  . acetaminophen (TYLENOL) 500 MG tablet Take 500 mg by mouth every 4 (four) hours as needed.   Marland Kitchen albuterol (PROVENTIL HFA;VENTOLIN HFA) 108 (90 BASE) MCG/ACT inhaler Inhale 2 puffs into the lungs daily as needed for wheezing.   Marland Kitchen albuterol (PROVENTIL) (2.5 MG/3ML) 0.083% nebulizer solution Take 2.5 mg by nebulization every 2 (two) hours as needed.   Marland Kitchen aspirin-acetaminophen-caffeine (EXCEDRIN MIGRAINE) 250-250-65 MG per tablet Take 1 tablet by mouth daily as needed for headache.   . baclofen (LIORESAL) 5 mg TABS tablet Take 5 mg by mouth 2 (two) times daily as needed.  . chlorpheniramine (CHLOR-TRIMETON) 4 MG tablet Take 4 mg by mouth daily as needed for allergies.  . Cholecalciferol (VITAMIN D-3) 5000 UNITS TABS Take 5,000 Units by mouth daily.   . DULoxetine (CYMBALTA) 60 MG capsule Take 60 mg by mouth 2 (two) times daily.   Marland Kitchen estradiol (ESTRACE) 2 MG tablet Take 1 tablet (2 mg total) by mouth daily.  . folic acid (FOLVITE) 1 MG tablet Take 1 mg by mouth 2 (two) times daily.  Marland Kitchen gabapentin (NEURONTIN) 100 MG capsule Take 300 mg by mouth at bedtime. Can take up to 611m daily as needed  . Linaclotide (LINZESS) 145 MCG CAPS Take 145 mcg by mouth as needed.   . pantoprazole (PROTONIX) 40 MG tablet Take 40 mg by mouth 2 (two) times daily.   . potassium chloride (KLOR-CON) 20 MEQ packet Take 40 mEq by mouth daily.  . pseudoephedrine (SUDAFED) 120 MG 12 hr tablet Take 120 mg by mouth daily as needed for congestion.  .Marland KitchenrOPINIRole (REQUIP) 4 MG tablet Take 4 mg by mouth 2 (two) times daily.   .Marland Kitchentorsemide (DEMADEX) 20 MG tablet Take 3 tablets (60 mg total) by mouth daily.  . vitamin E 400 UNIT capsule Take 400 Units by mouth daily.

## 2013-11-27 NOTE — Patient Instructions (Signed)
An overnight sleep oxygen test will be obtained on Room air with cpap No change in medications We will give flu vaccine and pneumovax today Return [place] 3 months

## 2013-11-27 NOTE — Addendum Note (Signed)
Addended by: Raymondo Band D on: 11/27/2013 03:37 PM   Modules accepted: Orders

## 2013-12-06 ENCOUNTER — Encounter (HOSPITAL_COMMUNITY): Payer: Self-pay | Admitting: *Deleted

## 2013-12-06 ENCOUNTER — Ambulatory Visit (HOSPITAL_COMMUNITY)
Admission: RE | Admit: 2013-12-06 | Discharge: 2013-12-06 | Disposition: A | Discharge status: Home / Self Care | Payer: Medicaid Other | Source: Ambulatory Visit | Attending: Internal Medicine | Admitting: Internal Medicine

## 2013-12-06 VITALS — BP 150/74 | HR 81 | Wt 272.2 lb

## 2013-12-06 DIAGNOSIS — I10 Essential (primary) hypertension: Secondary | ICD-10-CM | POA: Insufficient documentation

## 2013-12-06 DIAGNOSIS — R0609 Other forms of dyspnea: Secondary | ICD-10-CM | POA: Diagnosis not present

## 2013-12-06 DIAGNOSIS — I5032 Chronic diastolic (congestive) heart failure: Secondary | ICD-10-CM | POA: Diagnosis not present

## 2013-12-06 DIAGNOSIS — I509 Heart failure, unspecified: Secondary | ICD-10-CM | POA: Insufficient documentation

## 2013-12-06 DIAGNOSIS — G4733 Obstructive sleep apnea (adult) (pediatric): Secondary | ICD-10-CM | POA: Diagnosis not present

## 2013-12-06 DIAGNOSIS — J45909 Unspecified asthma, uncomplicated: Secondary | ICD-10-CM | POA: Insufficient documentation

## 2013-12-06 DIAGNOSIS — E669 Obesity, unspecified: Secondary | ICD-10-CM | POA: Diagnosis not present

## 2013-12-06 DIAGNOSIS — R0989 Other specified symptoms and signs involving the circulatory and respiratory systems: Secondary | ICD-10-CM | POA: Diagnosis not present

## 2013-12-06 DIAGNOSIS — R0600 Dyspnea, unspecified: Secondary | ICD-10-CM

## 2013-12-06 LAB — BASIC METABOLIC PANEL
Anion gap: 22 — ABNORMAL HIGH (ref 5–15)
BUN: 13 mg/dL (ref 6–23)
CO2: 24 mEq/L (ref 19–32)
Calcium: 9.7 mg/dL (ref 8.4–10.5)
Chloride: 90 mEq/L — ABNORMAL LOW (ref 96–112)
Creatinine, Ser: 0.72 mg/dL (ref 0.50–1.10)
GFR calc Af Amer: >90 mL/min (ref >90)
GFR calc non Af Amer: >90 mL/min (ref >90)
Glucose, Bld: 160 mg/dL — ABNORMAL HIGH (ref 70–99)
Potassium: 4 mEq/L (ref 3.7–5.3)
Sodium: 136 mEq/L — ABNORMAL LOW (ref 137–147)

## 2013-12-06 NOTE — Patient Instructions (Addendum)
Lab today  Right Heart Catheterization scheduled for 12/14/13, see instructions sheet  Your physician recommends that you schedule a follow-up appointment in: 2 WEEKS

## 2013-12-07 ENCOUNTER — Other Ambulatory Visit: Payer: Self-pay | Admitting: *Deleted

## 2013-12-07 NOTE — Progress Notes (Signed)
Patient ID: Kathleen Hatfield, female   DOB: Sep 15, 1955, 58 y.o.   MRN: [id number] Primary cardiologist: Dr. Burt Knack  58 yo with history of chronic diastolic CHF and asthma presents for CHF clinic evaluation.  She has quite severe exertional dyspnea.  She is short of breath walking about 50-100 feet.  She has orthopnea and raises the head of her bed.  No PND.  She has rare pleuritic nonexertional chest pain.  She says that she has gained 60 lbs over the last 2 years.  She has been switched from Lasix to torsemide but does not think this has made much difference.    Last echo in 4/14 showed EF 60-65% with grade II diastolic dysfunction and normal RV size/systolic function. Cardiolite in 4/14 showed no ischemia.  PFTs in 12/14 suggested small airways disease.  She follows with Dr Joya Gaskins for asthma.   Labs (8/15): BNP 22, K 3.5, creatinine 1.4  PMH: 1. Chronic diastolic CHF: Echo (8/25) with EF 60-65%, grade II diastolic dysfunction, normal RV size and systolic function with mild RVH, mild AS. RHC (12/14) with mean RA 13, PA 31/16 mean 25, mean PCWP 17, CI 3.   2. Aortic stenosis: Mild on 4/14 echo.  3. Obesity 4. OSA: On CPAP.  5. Asthma: CT chest 12/14 with chronic small airways disease.  PFTs (12/14) with FEV1 57%, FVC 71%, ratio 64% => suggestive of small airways disease.  6. Ulcerative colitis 7. HTN 8. Cardiolite 4/14: EF 82%, no ischemia or infarction.   FH: Mother with PCI in her 18s.  Father with AAA.  SH: Never smoked, lives with her mother.   ROS: All systems reviewed and negative except as per HPI.   Current Outpatient Prescriptions  Medication Sig Dispense Refill  . acetaminophen (TYLENOL) 500 MG tablet Take 500 mg by mouth every 4 (four) hours as needed.       Marland Kitchen albuterol (PROVENTIL HFA;VENTOLIN HFA) 108 (90 BASE) MCG/ACT inhaler Inhale 2 puffs into the lungs daily as needed for wheezing.       Marland Kitchen albuterol (PROVENTIL) (2.5 MG/3ML) 0.083% nebulizer solution Take 2.5 mg by  nebulization every 2 (two) hours as needed.       Marland Kitchen aspirin-acetaminophen-caffeine (EXCEDRIN MIGRAINE) 250-250-65 MG per tablet Take 1 tablet by mouth daily as needed for headache.       . baclofen (LIORESAL) 5 mg TABS tablet Take 5 mg by mouth 2 (two) times daily as needed.      . chlorpheniramine (CHLOR-TRIMETON) 4 MG tablet Take 4 mg by mouth daily as needed for allergies.      . Cholecalciferol (VITAMIN D-3) 5000 UNITS TABS Take 5,000 Units by mouth daily.       . DULoxetine (CYMBALTA) 60 MG capsule Take 60 mg by mouth 2 (two) times daily.       Marland Kitchen estradiol (ESTRACE) 2 MG tablet Take 1 tablet (2 mg total) by mouth daily.  90 tablet  3  . folic acid (FOLVITE) 1 MG tablet Take 1 mg by mouth 2 (two) times daily.      Marland Kitchen gabapentin (NEURONTIN) 100 MG capsule Take 300 mg by mouth at bedtime. Can take up to 686m daily as needed      . Linaclotide (LINZESS) 145 MCG CAPS Take 145 mcg by mouth as needed.       . pantoprazole (PROTONIX) 40 MG tablet Take 40 mg by mouth 2 (two) times daily.       . potassium chloride (KLOR-CON) 20 MEQ  packet Take 40 mEq by mouth daily.  100 packet  6  . pseudoephedrine (SUDAFED) 120 MG 12 hr tablet Take 120 mg by mouth daily as needed for congestion.      Marland Kitchen rOPINIRole (REQUIP) 4 MG tablet Take 4 mg by mouth 2 (two) times daily.       Marland Kitchen torsemide (DEMADEX) 20 MG tablet Take 3 tablets (60 mg total) by mouth daily.  180 tablet  6  . vitamin E 400 UNIT capsule Take 400 Units by mouth daily.       No current facility-administered medications for this encounter.    BP 150/74  Pulse 81  Wt 272 lb 4 oz (123.492 kg)  SpO2 95% General: NAD, obese Neck: No JVD, no thyromegaly or thyroid nodule.  Lungs: Clear to auscultation bilaterally with normal respiratory effort. CV: Nondisplaced PMI.  Heart regular S1/S2, no S3/S4, no murmur.  Trace ankle edema bilaterally.  No carotid bruit.  Normal pedal pulses.  Abdomen: Soft, nontender, no hepatosplenomegaly, no distention.   Skin: Intact without lesions or rashes.  Neurologic: Alert and oriented x 3.  Psych: Normal affect. Extremities: No clubbing or cyanosis.  HEENT: Normal.   Assessment/Plan: 1. Exertional dyspnea: Patient has chronic diastolic CHF, but she has other reasons for significant dyspnea including obesity/deconditioning and asthma/chronic lung disease.  Exam is difficult for volume, but she does not appear markedly volume overloaded.  Switching her diuretic to torsemide did not help much.  BNP was not elevated in 8/15 but obesity can lead to false negative BNP.  - I think that probably the only way to determine her volume status is going to be RHC.  I will arrange this for next week.   - Continue torsemide 60 mg daily for now until RHC, will check BMET today.  - She needs to make a dedicated effort at weight loss.  - She drinks > 1 gallon fluid/day.  I would like her to cut daily sodium to < 2 g and fluid to < 2 L/day.   2. OSA: On CPAP, plan for overnight oximetry soon.   Followup in 2 wks.   Loralie Champagne 12/08/2013

## 2013-12-12 ENCOUNTER — Telehealth: Payer: Self-pay | Admitting: Critical Care Medicine

## 2013-12-12 ENCOUNTER — Encounter (HOSPITAL_COMMUNITY): Payer: Self-pay | Admitting: Pharmacy Technician

## 2013-12-12 NOTE — Telephone Encounter (Signed)
Pt last seen 9.21.15 in [place] by PW.  ONO was ordered at ov, but pt reported in message that this has yet to be done.  Called [facility] and spoke with Estill Bamberg and apprised her of the situation.  Estill Bamberg will have RT Elmyra Ricks call patient in the morning to set up the ONO.  Called spoke with patient and apologized for the inconvenience and delay in care.  Advised that [facility] RT should be calling her in the morning and asked pt to please call the office again if she does not hear from them.  Pt voiced her understanding and denied needing anything further at this time.  Will sign off.

## 2013-12-14 ENCOUNTER — Encounter (HOSPITAL_COMMUNITY)
Admission: RE | Disposition: A | Discharge status: Home / Self Care | Payer: Self-pay | Source: Ambulatory Visit | Attending: Cardiology

## 2013-12-14 ENCOUNTER — Ambulatory Visit (HOSPITAL_COMMUNITY)
Admission: RE | Admit: 2013-12-14 | Discharge: 2013-12-14 | Disposition: A | Discharge status: Home / Self Care | Payer: BC Managed Care – PPO | Source: Ambulatory Visit | Attending: Cardiology | Admitting: Cardiology

## 2013-12-14 DIAGNOSIS — G4733 Obstructive sleep apnea (adult) (pediatric): Secondary | ICD-10-CM | POA: Diagnosis not present

## 2013-12-14 DIAGNOSIS — E669 Obesity, unspecified: Secondary | ICD-10-CM | POA: Insufficient documentation

## 2013-12-14 DIAGNOSIS — I5032 Chronic diastolic (congestive) heart failure: Secondary | ICD-10-CM | POA: Diagnosis not present

## 2013-12-14 DIAGNOSIS — J45909 Unspecified asthma, uncomplicated: Secondary | ICD-10-CM | POA: Diagnosis not present

## 2013-12-14 DIAGNOSIS — K5190 Ulcerative colitis, unspecified, without complications: Secondary | ICD-10-CM | POA: Insufficient documentation

## 2013-12-14 DIAGNOSIS — I10 Essential (primary) hypertension: Secondary | ICD-10-CM | POA: Insufficient documentation

## 2013-12-14 DIAGNOSIS — I352 Nonrheumatic aortic (valve) stenosis with insufficiency: Secondary | ICD-10-CM | POA: Diagnosis not present

## 2013-12-14 DIAGNOSIS — R0609 Other forms of dyspnea: Secondary | ICD-10-CM | POA: Diagnosis present

## 2013-12-14 DIAGNOSIS — R0600 Dyspnea, unspecified: Secondary | ICD-10-CM

## 2013-12-14 HISTORY — PX: RIGHT HEART CATHETERIZATION: SHX5447

## 2013-12-14 LAB — POCT I-STAT 3, VENOUS BLOOD GAS (G3P V)
Acid-Base Excess: 10 mmol/L — ABNORMAL HIGH (ref 0.0–2.0)
Acid-Base Excess: 5 mmol/L — ABNORMAL HIGH (ref 0.0–2.0)
Bicarbonate: 35.8 mEq/L — ABNORMAL HIGH (ref 20.0–24.0)
Bicarbonate: 29.8 mEq/L — ABNORMAL HIGH (ref 20.0–24.0)
O2 Saturation: 64 %
O2 Saturation: 67 %
TCO2: 37 mmol/L (ref 0–100)
TCO2: 31 mmol/L (ref 0–100)
pCO2, Ven: 54.9 mmHg — ABNORMAL HIGH (ref 45.0–50.0)
pCO2, Ven: 45.9 mmHg (ref 45.0–50.0)
pH, Ven: 7.422 — ABNORMAL HIGH (ref 7.250–7.300)
pH, Ven: 7.421 — ABNORMAL HIGH (ref 7.250–7.300)
pO2, Ven: 33 mmHg (ref 30.0–45.0)
pO2, Ven: 34 mmHg (ref 30.0–45.0)

## 2013-12-14 LAB — CBC
HCT: 32.5 % — ABNORMAL LOW (ref 36.0–46.0)
Hemoglobin: 10 g/dL — ABNORMAL LOW (ref 12.0–15.0)
MCH: 24 pg — ABNORMAL LOW (ref 26.0–34.0)
MCHC: 30.8 g/dL (ref 30.0–36.0)
MCV: 78.1 fL (ref 78.0–100.0)
Platelets: 421 10*3/uL — ABNORMAL HIGH (ref 150–400)
RBC: 4.16 MIL/uL (ref 3.87–5.11)
RDW: 18.4 % — ABNORMAL HIGH (ref 11.5–15.5)
WBC: 10.2 10*3/uL (ref 4.0–10.5)

## 2013-12-14 LAB — BASIC METABOLIC PANEL
Anion gap: 15 (ref 5–15)
BUN: 15 mg/dL (ref 6–23)
CO2: 31 mEq/L (ref 19–32)
Calcium: 9.4 mg/dL (ref 8.4–10.5)
Chloride: 93 mEq/L — ABNORMAL LOW (ref 96–112)
Creatinine, Ser: 0.86 mg/dL (ref 0.50–1.10)
GFR calc Af Amer: 85 mL/min — ABNORMAL LOW (ref >90)
GFR calc non Af Amer: 73 mL/min — ABNORMAL LOW (ref >90)
Glucose, Bld: 176 mg/dL — ABNORMAL HIGH (ref 70–99)
Potassium: 3.1 mEq/L — ABNORMAL LOW (ref 3.7–5.3)
Sodium: 139 mEq/L (ref 137–147)

## 2013-12-14 LAB — PROTIME-INR
INR: 0.88 (ref 0.00–1.49)
Prothrombin Time: 11.9 seconds (ref 11.6–15.2)

## 2013-12-14 SURGERY — RIGHT HEART CATH
Anesthesia: LOCAL

## 2013-12-14 MED ORDER — SODIUM CHLORIDE 0.9 % IV SOLN: INTRAVENOUS | Status: DC

## 2013-12-14 MED ORDER — SODIUM CHLORIDE 0.9 % IJ SOLN: 3.0000 mL | INTRAMUSCULAR | Status: DC | PRN

## 2013-12-14 MED ORDER — ASPIRIN 81 MG PO CHEW: 81.0000 mg | CHEWABLE_TABLET | ORAL | Status: DC

## 2013-12-14 MED ORDER — MIDAZOLAM HCL 2 MG/2ML IJ SOLN
INTRAMUSCULAR | Status: AC
  Filled 2013-12-14: qty 2

## 2013-12-14 MED ORDER — SODIUM CHLORIDE 0.9 % IJ SOLN
3.0000 mL | Freq: Two times a day (BID) | INTRAMUSCULAR | Status: DC
Start: 2013-12-14 — End: 2013-12-14

## 2013-12-14 MED ORDER — ONDANSETRON HCL 4 MG/2ML IJ SOLN: 4.0000 mg | Freq: Four times a day (QID) | INTRAMUSCULAR | Status: DC | PRN

## 2013-12-14 MED ORDER — SODIUM CHLORIDE 0.9 % IV SOLN: 250.0000 mL | INTRAVENOUS | Status: DC | PRN

## 2013-12-14 MED ORDER — SODIUM CHLORIDE 0.9 % IJ SOLN: 3.0000 mL | Freq: Two times a day (BID) | INTRAMUSCULAR | Status: DC

## 2013-12-14 MED ORDER — HEPARIN (PORCINE) IN NACL 2-0.9 UNIT/ML-% IJ SOLN
INTRAMUSCULAR | Status: AC
  Filled 2013-12-14: qty 1000

## 2013-12-14 MED ORDER — FENTANYL CITRATE 0.05 MG/ML IJ SOLN
INTRAMUSCULAR | Status: AC
  Filled 2013-12-14: qty 2

## 2013-12-14 MED ORDER — ASPIRIN 81 MG PO CHEW
CHEWABLE_TABLET | ORAL | Status: AC
  Filled 2013-12-14: qty 1

## 2013-12-14 MED ORDER — ACETAMINOPHEN 325 MG PO TABS: 650.0000 mg | ORAL_TABLET | ORAL | Status: DC | PRN

## 2013-12-14 MED ORDER — POTASSIUM CHLORIDE CRYS ER 20 MEQ PO TBCR
40.0000 meq | EXTENDED_RELEASE_TABLET | Freq: Once | ORAL | Status: AC
  Administered 2013-12-14: 40 meq via ORAL

## 2013-12-14 MED ORDER — POTASSIUM CHLORIDE CRYS ER 20 MEQ PO TBCR
EXTENDED_RELEASE_TABLET | ORAL | Status: AC
  Filled 2013-12-14: qty 2

## 2013-12-14 MED ORDER — LIDOCAINE HCL (PF) 1 % IJ SOLN
INTRAMUSCULAR | Status: AC
  Filled 2013-12-14: qty 30

## 2013-12-14 MED ORDER — SODIUM CHLORIDE 0.9 % IV SOLN
250.0000 mL | INTRAVENOUS | Status: DC | PRN
Start: 2013-12-14 — End: 2013-12-14

## 2013-12-14 NOTE — CV Procedure (Signed)
    Cardiac Catheterization Procedure Note  Name: Kathleen Hatfield MRN: [id number] DOB: [birth year]  Procedure: Right Heart Cath  Indication: Exertional dyspnea, ? CHF, ? Pulmonary HTN   Procedural Details: The brachial area was prepped, draped, and anesthetized with 1% lidocaine. 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  This was replaced with a 5 French venous sheath. A Swan-Ganz catheter was used for the right heart catheterization. Standard protocol was followed for recording of right heart pressures and sampling of oxygen saturations. Fick cardiac output was calculated. There were no immediate procedural complications. The patient was transferred to the post catheterization recovery area for further monitoring.  Procedural Findings: Hemodynamics (mmHg) RA mean 9 RV 31/11 PA 26/14 PCWP mean 12  Oxygen saturations: PA 64% RA 67% AO 93%  Cardiac Output (Fick) 8.46  Cardiac Index (Fick) 3.76   Final Conclusions:  Normal left heart filling pressure, normal pulmonary artery pressure.  Mildly elevated right atrial pressure.  I do not think that CHF/volume overload is causing her current severe exertional dyspnea.  Her mildly elevated RV filling pressure may be due to severe OSA.  I would not increase her torsemide.  She does need to increase KCl to 60 mEq daily.  She will need followup with Dr Joya Gaskins, suspect her dyspnea is a combination of obesity/deconditioning, OSA/?OHS, and asthma.    Loralie Champagne 12/14/2013, 11:34 AM

## 2013-12-14 NOTE — Interval H&P Note (Signed)
History and Physical Interval Note:  12/14/2013 11:05 AM  Kathleen Hatfield  has presented today for surgery, with the diagnosis of hf  The various methods of treatment have been discussed with the patient and family. After consideration of risks, benefits and other options for treatment, the patient has consented to  Procedure(s): RIGHT HEART CATH (N/A) as a surgical intervention .  The patient's history has been reviewed, patient examined, no change in status, stable for surgery.  I have reviewed the patient's chart and labs.  Questions were answered to the patient's satisfaction.     Chanette Demo [employer]

## 2013-12-14 NOTE — H&P (View-Only) (Signed)
Patient ID: Kathleen Hatfield, female   DOB: 1955/06/19, 58 y.o.   MRN: [id number] Primary cardiologist: Dr. Burt Knack  58 yo with history of chronic diastolic CHF and asthma presents for CHF clinic evaluation.  She has quite severe exertional dyspnea.  She is short of breath walking about 50-100 feet.  She has orthopnea and raises the head of her bed.  No PND.  She has rare pleuritic nonexertional chest pain.  She says that she has gained 60 lbs over the last 2 years.  She has been switched from Lasix to torsemide but does not think this has made much difference.    Last echo in 4/14 showed EF 60-65% with grade II diastolic dysfunction and normal RV size/systolic function. Cardiolite in 4/14 showed no ischemia.  PFTs in 12/14 suggested small airways disease.  She follows with Dr Joya Gaskins for asthma.   Labs (8/15): BNP 22, K 3.5, creatinine 1.4  PMH: 1. Chronic diastolic CHF: Echo (3/41) with EF 60-65%, grade II diastolic dysfunction, normal RV size and systolic function with mild RVH, mild AS. RHC (12/14) with mean RA 13, PA 31/16 mean 25, mean PCWP 17, CI 3.   2. Aortic stenosis: Mild on 4/14 echo.  3. Obesity 4. OSA: On CPAP.  5. Asthma: CT chest 12/14 with chronic small airways disease.  PFTs (12/14) with FEV1 57%, FVC 71%, ratio 64% => suggestive of small airways disease.  6. Ulcerative colitis 7. HTN 8. Cardiolite 4/14: EF 82%, no ischemia or infarction.   FH: Mother with PCI in her 74s.  Father with AAA.  SH: Never smoked, lives with her mother.   ROS: All systems reviewed and negative except as per HPI.   Current Outpatient Prescriptions  Medication Sig Dispense Refill  . acetaminophen (TYLENOL) 500 MG tablet Take 500 mg by mouth every 4 (four) hours as needed.       Marland Kitchen albuterol (PROVENTIL HFA;VENTOLIN HFA) 108 (90 BASE) MCG/ACT inhaler Inhale 2 puffs into the lungs daily as needed for wheezing.       Marland Kitchen albuterol (PROVENTIL) (2.5 MG/3ML) 0.083% nebulizer solution Take 2.5 mg by  nebulization every 2 (two) hours as needed.       Marland Kitchen aspirin-acetaminophen-caffeine (EXCEDRIN MIGRAINE) 250-250-65 MG per tablet Take 1 tablet by mouth daily as needed for headache.       . baclofen (LIORESAL) 5 mg TABS tablet Take 5 mg by mouth 2 (two) times daily as needed.      . chlorpheniramine (CHLOR-TRIMETON) 4 MG tablet Take 4 mg by mouth daily as needed for allergies.      . Cholecalciferol (VITAMIN D-3) 5000 UNITS TABS Take 5,000 Units by mouth daily.       . DULoxetine (CYMBALTA) 60 MG capsule Take 60 mg by mouth 2 (two) times daily.       Marland Kitchen estradiol (ESTRACE) 2 MG tablet Take 1 tablet (2 mg total) by mouth daily.  90 tablet  3  . folic acid (FOLVITE) 1 MG tablet Take 1 mg by mouth 2 (two) times daily.      Marland Kitchen gabapentin (NEURONTIN) 100 MG capsule Take 300 mg by mouth at bedtime. Can take up to 611m daily as needed      . Linaclotide (LINZESS) 145 MCG CAPS Take 145 mcg by mouth as needed.       . pantoprazole (PROTONIX) 40 MG tablet Take 40 mg by mouth 2 (two) times daily.       . potassium chloride (KLOR-CON) 20 MEQ  packet Take 40 mEq by mouth daily.  100 packet  6  . pseudoephedrine (SUDAFED) 120 MG 12 hr tablet Take 120 mg by mouth daily as needed for congestion.      Marland Kitchen rOPINIRole (REQUIP) 4 MG tablet Take 4 mg by mouth 2 (two) times daily.       Marland Kitchen torsemide (DEMADEX) 20 MG tablet Take 3 tablets (60 mg total) by mouth daily.  180 tablet  6  . vitamin E 400 UNIT capsule Take 400 Units by mouth daily.       No current facility-administered medications for this encounter.    BP 150/74  Pulse 81  Wt 272 lb 4 oz (123.492 kg)  SpO2 95% General: NAD, obese Neck: No JVD, no thyromegaly or thyroid nodule.  Lungs: Clear to auscultation bilaterally with normal respiratory effort. CV: Nondisplaced PMI.  Heart regular S1/S2, no S3/S4, no murmur.  Trace ankle edema bilaterally.  No carotid bruit.  Normal pedal pulses.  Abdomen: Soft, nontender, no hepatosplenomegaly, no distention.   Skin: Intact without lesions or rashes.  Neurologic: Alert and oriented x 3.  Psych: Normal affect. Extremities: No clubbing or cyanosis.  HEENT: Normal.   Assessment/Plan: 1. Exertional dyspnea: Patient has chronic diastolic CHF, but she has other reasons for significant dyspnea including obesity/deconditioning and asthma/chronic lung disease.  Exam is difficult for volume, but she does not appear markedly volume overloaded.  Switching her diuretic to torsemide did not help much.  BNP was not elevated in 8/15 but obesity can lead to false negative BNP.  - I think that probably the only way to determine her volume status is going to be RHC.  I will arrange this for next week.   - Continue torsemide 60 mg daily for now until RHC, will check BMET today.  - She needs to make a dedicated effort at weight loss.  - She drinks > 1 gallon fluid/day.  I would like her to cut daily sodium to < 2 g and fluid to < 2 L/day.   2. OSA: On CPAP, plan for overnight oximetry soon.   Followup in 2 wks.   Loralie Champagne 12/08/2013

## 2013-12-14 NOTE — Discharge Instructions (Signed)
Wound Care Wound care helps prevent pain and infection.  HOME CARE   Only take medicine as told by your doctor.  Clean the wound daily with mild soap and water.  Remove bandage (dressing) in the morning.  Keep all doctor visits as told. GET HELP RIGHT AWAY IF:   Yellowish-white fluid (pus) comes from the wound.  Medicine does not lessen your pain.  There is a red streak going away from the wound.  You have a fever. MAKE SURE YOU:   Understand these instructions.  Will watch your condition.  Will get help right away if you are not doing well or get worse. Document Released: 12/03/2007 Document Revised: 05/18/2011 Document Reviewed: 06/29/2010 [facility] Patient Information 2015 [place], [place]. This information is not intended to replace advice given to you by your health care provider. Make sure you discuss any questions you have with your health care provider.

## 2013-12-19 ENCOUNTER — Ambulatory Visit (HOSPITAL_COMMUNITY)
Admission: RE | Admit: 2013-12-19 | Discharge: 2013-12-19 | Disposition: A | Discharge status: Home / Self Care | Payer: BC Managed Care – PPO | Source: Ambulatory Visit | Attending: Cardiology | Admitting: Cardiology

## 2013-12-19 VITALS — BP 156/66 | HR 93 | Wt 277.2 lb

## 2013-12-19 DIAGNOSIS — R0609 Other forms of dyspnea: Secondary | ICD-10-CM | POA: Insufficient documentation

## 2013-12-19 DIAGNOSIS — I5032 Chronic diastolic (congestive) heart failure: Secondary | ICD-10-CM | POA: Diagnosis not present

## 2013-12-19 DIAGNOSIS — G4733 Obstructive sleep apnea (adult) (pediatric): Secondary | ICD-10-CM | POA: Insufficient documentation

## 2013-12-19 DIAGNOSIS — I10 Essential (primary) hypertension: Secondary | ICD-10-CM | POA: Diagnosis not present

## 2013-12-19 DIAGNOSIS — R0600 Dyspnea, unspecified: Secondary | ICD-10-CM

## 2013-12-19 LAB — BASIC METABOLIC PANEL
Anion gap: 18 — ABNORMAL HIGH (ref 5–15)
BUN: 16 mg/dL (ref 6–23)
CO2: 26 mEq/L (ref 19–32)
Calcium: 9.9 mg/dL (ref 8.4–10.5)
Chloride: 96 mEq/L (ref 96–112)
Creatinine, Ser: 1.12 mg/dL — ABNORMAL HIGH (ref 0.50–1.10)
GFR calc Af Amer: 62 mL/min — ABNORMAL LOW (ref >90)
GFR calc non Af Amer: 53 mL/min — ABNORMAL LOW (ref >90)
Glucose, Bld: 213 mg/dL — ABNORMAL HIGH (ref 70–99)
Potassium: 3.9 mEq/L (ref 3.7–5.3)
Sodium: 140 mEq/L (ref 137–147)

## 2013-12-19 NOTE — Patient Instructions (Signed)
Lab today  Follow up as needed

## 2013-12-20 NOTE — Progress Notes (Signed)
Patient ID: Kathleen Hatfield, female   DOB: 1956-02-27, 58 y.o.   MRN: [id number] Primary cardiologist: Dr. Burt Hatfield  58 yo with history of chronic diastolic CHF and asthma presents for CHF clinic followup.  She has quite severe exertional dyspnea.  She is short of breath walking about 50-100 feet.  She has orthopnea and raises the head of her bed.  No PND.  She has rare pleuritic nonexertional chest pain.  She says that she has gained 60 lbs over the last 2 years.  She has been switched from Lasix to torsemide but does not think this has made much difference.    Last echo in 4/14 showed EF 60-65% with grade II diastolic dysfunction and normal RV size/systolic function. Cardiolite in 4/14 showed no ischemia.  PFTs in 12/14 suggested small airways disease.  She follows with Dr Kathleen Hatfield for asthma.   Despite her marked dyspnea, I did not think at last appointment that she looked particularly volume overloaded and BNP was not elevated (though can be falsely negative with obesity).  I did a RHC in 10/15 that showed normal left heart filling pressure and PA pressure and mildly elevated right heart filling pressure.  Symptoms are stable today.   Labs (8/15): BNP 22, K 3.5, creatinine 1.4 Labs (10/15): HCT 32.5  PMH: 1. Chronic diastolic CHF: Echo (4/65) with EF 60-65%, grade II diastolic dysfunction, normal RV size and systolic function with mild RVH, mild AS. RHC (12/14) with mean RA 13, PA 31/16 mean 25, mean PCWP 17, CI 3.  RHC (10/15) with mean RA 9, PA 26/14, mean PCWP 12, CI 3.76.  2. Aortic stenosis: Mild on 4/14 echo.  3. Obesity 4. OSA: On CPAP.  5. Asthma: CT chest 12/14 with chronic small airways disease.  PFTs (12/14) with FEV1 57%, FVC 71%, ratio 64% => suggestive of small airways disease.  6. Ulcerative colitis 7. HTN 8. Cardiolite 4/14: EF 82%, no ischemia or infarction.   FH: Mother with PCI in her 3s.  Father with AAA.  SH: Never smoked, lives with her mother.   ROS: All systems reviewed  and negative except as per HPI.   Current Outpatient Prescriptions  Medication Sig Dispense Refill  . acetaminophen (TYLENOL) 500 MG tablet Take 500 mg by mouth every 4 (four) hours as needed (pain).       Marland Kitchen albuterol (PROVENTIL HFA;VENTOLIN HFA) 108 (90 BASE) MCG/ACT inhaler Inhale 2 puffs into the lungs daily as needed for wheezing.       Marland Kitchen albuterol (PROVENTIL) (2.5 MG/3ML) 0.083% nebulizer solution Take 2.5 mg by nebulization every 2 (two) hours as needed for wheezing.       . Alum Hydroxide-Mag Carbonate (GAVISCON PO) Take 1-2 tablets by mouth at bedtime.      . bacitracin ointment Apply 1 application topically 2 (two) times daily.      . baclofen (LIORESAL) 5 mg TABS tablet Take 5 mg by mouth 2 (two) times daily as needed for muscle spasms.       . chlorpheniramine (CHLOR-TRIMETON) 4 MG tablet Take 4 mg by mouth daily as needed for allergies.      . Cholecalciferol (VITAMIN D-3) 5000 UNITS TABS Take 5,000 Units by mouth daily.       . DULoxetine (CYMBALTA) 60 MG capsule Take 60 mg by mouth 2 (two) times daily.       Marland Kitchen estradiol (ESTRACE) 2 MG tablet Take 1 tablet (2 mg total) by mouth daily.  90 tablet  3  .  folic acid (FOLVITE) 1 MG tablet Take 1 mg by mouth 2 (two) times daily.      Marland Kitchen gabapentin (NEURONTIN) 100 MG capsule Take 300 mg by mouth at bedtime. Can take up to 639m daily as needed      . Linaclotide (LINZESS) 145 MCG CAPS Take 145 mcg by mouth every other day.       . pantoprazole (PROTONIX) 40 MG tablet Take 40 mg by mouth daily.       . potassium chloride (KLOR-CON) 20 MEQ packet Take 60 mEq by mouth daily.      . pseudoephedrine (SUDAFED) 120 MG 12 hr tablet Take 120 mg by mouth daily as needed for congestion.      .Marland KitchenrOPINIRole (REQUIP) 4 MG tablet Take 4 mg by mouth 2 (two) times daily.       .Marland Kitchentorsemide (DEMADEX) 20 MG tablet Take 3 tablets (60 mg total) by mouth daily.  180 tablet  6   No current facility-administered medications for this encounter.    BP 156/66   Pulse 93  Wt 277 lb 4 oz (125.76 kg)  SpO2 95% General: NAD, obese Neck: No JVD, no thyromegaly or thyroid nodule.  Lungs: Clear to auscultation bilaterally with normal respiratory effort. CV: Nondisplaced PMI.  Heart regular S1/S2, no S3/S4, no murmur.  Trace ankle edema bilaterally.  No carotid bruit.  Normal pedal pulses.  Abdomen: Soft, nontender, no hepatosplenomegaly, no distention.  Skin: Intact without lesions or rashes.  Neurologic: Alert and oriented x 3.  Psych: Normal affect. Extremities: No clubbing or cyanosis.   Assessment/Plan: 1. Exertional dyspnea: Patient has chronic diastolic CHF, but she has other reasons for significant dyspnea including obesity/deconditioning and asthma/chronic lung disease.  Exam is difficult for volume, but she does not appear markedly volume overloaded.  Switching her diuretic to torsemide did not help much.  BNP was not elevated in 8/15 but obesity can lead to false negative BNP.   RHC showed normal left heart filling pressure, normal PA pressure, and mildly elevated right heart filling pressure.  I think that her dyspnea at this point is not predominantly related to volume overload/CHF.  The mildly elevated right heart filling pressure may be related to OSA.   - Continue torsemide at current dose, I would not increase it.   - She needs to make a dedicated effort at weight loss.  - She drinks > 1 gallon fluid/day.  I would like her to cut daily sodium to < 2 g and fluid to < 2 L/day.  - She needs followup with pulmonary => I think that she may benefit from pulmonary rehab.   2. OSA: On CPAP, just finished overnight oximetry.   Followup with Dr Kathleen Hatfield scheduled and with Dr Kathleen Hatfield   Kathleen Champagne10/14/2015

## 2013-12-28 ENCOUNTER — Telehealth: Payer: Self-pay | Admitting: Critical Care Medicine

## 2013-12-28 NOTE — Telephone Encounter (Signed)
Spoke with pt. She reports she had ONO done about 2 weeks ago. She is requesting the results. Reports [facility] did testing. Pt aware PW not in office until Monday. Will forward to [place] to ensure results have been received upon his return. Please advise thanks

## 2013-12-29 NOTE — Telephone Encounter (Signed)
Noted , will review

## 2013-12-29 NOTE — Telephone Encounter (Signed)
Results received and placed in PW's folder for review.

## 2013-12-29 NOTE — Telephone Encounter (Signed)
[place], [place], spoke with Elmyra Ricks. ONO on RA with Cpap done on 12/14/13. She will fax results to triage.

## 2014-01-01 NOTE — Telephone Encounter (Signed)
Per PW: let pt know ONO is normal.  Called, spoke with pt.  Informed her of ONO results per Dr. Joya Gaskins.  She verbalized understanding and is requesting to schedule Dec OV with PW. Appt scheduled for Dec 15 at 12 pm in [place].  Pt confirmed appt and voiced no further questions or concerns at this time.  Results placed in scan folder.

## 2014-02-05 ENCOUNTER — Telehealth: Payer: Self-pay | Admitting: Cardiovascular Disease

## 2014-02-05 NOTE — Telephone Encounter (Signed)
ERROR---Per pt wants a message routed for her mother to nurse///sr

## 2014-02-15 ENCOUNTER — Encounter (HOSPITAL_COMMUNITY): Payer: Self-pay | Admitting: Cardiovascular Disease

## 2014-02-20 ENCOUNTER — Ambulatory Visit: Payer: BC Managed Care – PPO | Admitting: Critical Care Medicine

## 2014-02-20 ENCOUNTER — Ambulatory Visit (INDEPENDENT_AMBULATORY_CARE_PROVIDER_SITE_OTHER): Payer: BC Managed Care – PPO | Admitting: Critical Care Medicine

## 2014-02-20 ENCOUNTER — Encounter: Payer: Self-pay | Admitting: Critical Care Medicine

## 2014-02-20 VITALS — BP 122/74 | HR 97 | Temp 99.2°F | Ht 65.25 in | Wt 269.0 lb

## 2014-02-20 DIAGNOSIS — J984 Other disorders of lung: Secondary | ICD-10-CM

## 2014-02-20 DIAGNOSIS — G4733 Obstructive sleep apnea (adult) (pediatric): Secondary | ICD-10-CM

## 2014-02-20 MED ORDER — UMECLIDINIUM-VILANTEROL 62.5-25 MCG/INH IN AEPB: 1.0000 | INHALATION_SPRAY | Freq: Every day | RESPIRATORY_TRACT | Status: DC

## 2014-02-20 NOTE — Progress Notes (Signed)
Patient ID: Kathleen Hatfield, female   DOB: 07-19-1955, 58 y.o.   MRN: [id number]  Subjective:    Patient ID: Kathleen Hatfield, female    DOB: [birth year], 57 y.o.   MRN: [id number]  HPI 02/20/2014 Chief Complaint  Patient presents with  . 3 month follow up    DOE is unchanged.  Chest tightness present and wheezing mainly qhs and after exertion.  No cough.  Notes dyspnea is the same.  Pt did find out is DM.  Adult onset.  No cough Had ONO on Cpap with RA and was normal, no desaturation Pt denies any significant sore throat, nasal congestion or excess secretions, fever, chills, sweats, unintended weight loss, pleurtic or exertional chest pain, orthopnea PND, or leg swelling Pt denies any increase in rescue therapy over baseline, denies waking up needing it or having any early am or nocturnal exacerbations of coughing/wheezing/or dyspnea. Pt also denies any obvious fluctuation in symptoms with  weather or environmental change or other alleviating or aggravating factors   Review of Systems  Constitutional: Positive for chills, diaphoresis, activity change, appetite change, fatigue and unexpected weight change.  HENT: Positive for facial swelling, tinnitus and trouble swallowing. Negative for congestion, dental problem, ear discharge, hearing loss, mouth sores, nosebleeds, postnasal drip, sinus pressure, sneezing and voice change.   Eyes: Negative for photophobia, discharge, itching and visual disturbance.  Respiratory: Positive for cough and chest tightness. Negative for apnea, choking and stridor.   Cardiovascular: Positive for palpitations.  Gastrointestinal: Positive for nausea, constipation and abdominal distention. Negative for blood in stool.  Genitourinary: Negative for dysuria, urgency, frequency, hematuria, flank pain, decreased urine volume and difficulty urinating.  Musculoskeletal: Positive for myalgias and arthralgias. Negative for back pain, joint swelling, gait problem and neck stiffness.   Skin: Positive for color change. Negative for pallor.  Neurological: Positive for light-headedness. Negative for dizziness, tremors, seizures, syncope, speech difficulty, weakness and numbness.  Hematological: Negative for adenopathy. Does not bruise/bleed easily.  Psychiatric/Behavioral: Negative for confusion, sleep disturbance and agitation. The patient is not nervous/anxious.        Objective:   Physical Exam Filed Vitals:   02/20/14 1516  BP: 122/74  Pulse: 97  Temp: 99.2 F (37.3 C)  TempSrc: Oral  Height: 5' 5.25" (1.657 m)  Weight: 269 lb (122.018 kg)  SpO2: 95%    Gen: Pleasant, obese , in no distress,  normal affect  ENT: No lesions,  mouth clear,  oropharynx clear, no postnasal drip  Neck: No JVD, no TMG, no carotid bruits  Lungs: No use of accessory muscles, no dullness to percussion, clear without rales or rhonchi, distant BS   Cardiovascular: RRR, heart sounds normal, no murmur or gallops, no  peripheral edema  Abdomen: soft and NT, no HSM,  BS normal  Musculoskeletal: No deformities, no cyanosis or clubbing  Neuro: alert, non focal  Skin: Warm, no lesions or rashes  No results found.      Assessment & Plan:   Small airways disease Chronic small airways disease with significant airway obstruction Failed Symbicort Plan Trial ANORO one puff daily     Updated Medication List Outpatient Encounter Prescriptions as of 02/20/2014  Medication Sig  . acetaminophen (TYLENOL) 500 MG tablet Take 500 mg by mouth every 4 (four) hours as needed (pain).   Marland Kitchen albuterol (PROVENTIL HFA;VENTOLIN HFA) 108 (90 BASE) MCG/ACT inhaler Inhale 2 puffs into the lungs daily as needed for wheezing.   Marland Kitchen albuterol (PROVENTIL) (2.5 MG/3ML) 0.083% nebulizer solution  Take 2.5 mg by nebulization every 2 (two) hours as needed for wheezing.   . Alum Hydroxide-Mag Carbonate (GAVISCON PO) Take 1-2 tablets by mouth at bedtime.  . bacitracin ointment Apply 1 application topically 2  (two) times daily.  . baclofen (LIORESAL) 5 mg TABS tablet Take 5 mg by mouth 2 (two) times daily as needed for muscle spasms.   . chlorpheniramine (CHLOR-TRIMETON) 4 MG tablet Take 4 mg by mouth daily as needed for allergies.  . Cholecalciferol (VITAMIN D-3) 5000 UNITS TABS Take 5,000 Units by mouth daily.   . DULoxetine (CYMBALTA) 60 MG capsule Take 60 mg by mouth 2 (two) times daily.   Marland Kitchen estradiol (ESTRACE) 2 MG tablet Take 1 tablet (2 mg total) by mouth daily.  . folic acid (FOLVITE) 1 MG tablet Take 1 mg by mouth 2 (two) times daily.  Marland Kitchen gabapentin (NEURONTIN) 100 MG capsule Take 300 mg by mouth at bedtime. Can take up to 648m daily as needed  . Linaclotide (LINZESS) 145 MCG CAPS Take 145 mcg by mouth every other day as needed.   .Marland KitchenLORazepam (ATIVAN) 1 MG tablet Take 1 tablet by mouth 2 (two) times daily as needed.  . metFORMIN (GLUCOPHAGE-XR) 750 MG 24 hr tablet Take 1 tablet by mouth daily.  . pantoprazole (PROTONIX) 40 MG tablet Take 40 mg by mouth daily.   . potassium chloride (KLOR-CON) 20 MEQ packet Take 60 mEq by mouth daily.  . pseudoephedrine (SUDAFED) 120 MG 12 hr tablet Take 120 mg by mouth daily as needed for congestion.  .Marland KitchenrOPINIRole (REQUIP) 4 MG tablet Take 4 mg by mouth 2 (two) times daily.   .Marland Kitchentorsemide (DEMADEX) 20 MG tablet Take 3 tablets (60 mg total) by mouth daily.  .Marland KitchenUmeclidinium-Vilanterol (ANORO ELLIPTA) 62.5-25 MCG/INH AEPB Inhale 1 puff into the lungs daily.

## 2014-02-20 NOTE — Patient Instructions (Signed)
Trial Anoro one puff daily, use samples Refill if it helps No other changes Return 3 months

## 2014-02-21 NOTE — Assessment & Plan Note (Signed)
Chronic small airways disease with significant airway obstruction Failed Symbicort Plan Trial ANORO one puff daily

## 2014-05-08 DIAGNOSIS — I5032 Chronic diastolic (congestive) heart failure: Secondary | ICD-10-CM | POA: Insufficient documentation

## 2014-05-08 HISTORY — DX: Chronic diastolic (congestive) heart failure: I50.32

## 2014-05-11 ENCOUNTER — Ambulatory Visit (INDEPENDENT_AMBULATORY_CARE_PROVIDER_SITE_OTHER): Payer: BLUE CROSS/BLUE SHIELD | Admitting: Cardiovascular Disease

## 2014-05-11 ENCOUNTER — Encounter: Payer: Self-pay | Admitting: Cardiovascular Disease

## 2014-05-11 VITALS — BP 116/82 | HR 87 | Ht 65.25 in | Wt 277.4 lb

## 2014-05-11 DIAGNOSIS — I5032 Chronic diastolic (congestive) heart failure: Secondary | ICD-10-CM | POA: Diagnosis not present

## 2014-05-11 NOTE — Patient Instructions (Signed)
Your physician recommends that you continue on your current medications as directed. Please refer to the Current Medication list given to you today.  Your physician wants you to follow-up in: 6 months with Dr. Burt Knack. You will receive a reminder letter in the mail two months in advance. If you don't receive a letter, please call our office to schedule the follow-up appointment.

## 2014-05-11 NOTE — Progress Notes (Signed)
Patient ID: Kathleen Hatfield, female   DOB: 1955-08-19, 59 y.o.   MRN: [id number]    Cardiology Office Note Date:  05/11/2014   ID:  Kathleen Hatfield, DOB [birth year], MRN [id number]  PCP:  Ann Held, MD  Cardiologist:  [name], MD    Chief Complaint  Patient presents with  . Shortness of Breath     History of Present Illness: Kathleen Hatfield is a 59 y.o. female who presents for follow up for her chronic diastolic heart failure. Her comorbidities include obesity, OSA/?OHS, and asthma.   She was referred to the heart failure clinic for further evaluation for her severe exertional dyspnea. Patient underwent cardiac catheterization in Nov 2015 which revealed normal left heart filling pressure, normal pulmonary artery pressure, and mildly elevated right atrial pressure.She was instructed to follow up with Dr. Joya Gaskins (pulmonology) as her dyspnea was thought to be more related to her obesity/deconditioning. She saw Dr. 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   Today, patient reports her dyspnea has improved since starting the Anoro inhaler. She reports she has been able to walk her long driveway back and forth without stopping. She has not been able to do this for the last 3-4 years. She denies chest pain, palpitations, orthopnea, leg swelling. She also notes she was recently diagnosed with Type 2 DM with HbA1c 9.0. She is making efforts to lose weight by counting her calories and avoiding carbs and sugar.     Past Medical History  Diagnosis Date  . Depression   . Hypertension   . Chronic diastolic heart failure   . Dyspnea   . Asthma   . GERD (gastroesophageal reflux disease)   . Hyperlipidemia     cannot tolerate statins  . Ulcerative colitis     dr Collene Mares    Past Surgical History  Procedure Laterality Date  . Abdominal hysterectomy    . Cholecystectomy    . Cesarean section    .  Appendectomy    . Sigmoidectomy  2010    diverticular disease  . Right heart catheterization N/A 02/27/2013    Procedure: RIGHT HEART CATH;  Surgeon: Blane Ohara, MD;  Location: [facility] CATH LAB;  Service: Cardiovascular;  Laterality: N/A;  . Right heart catheterization N/A 12/14/2013    Procedure: RIGHT HEART CATH;  Surgeon: Larey Dresser, MD;  Location[facility] C[facility] LAB;  Service: Cardiovascular;  Laterality: N/A;    Current Outpatient Prescriptions  Medication Sig Dispense Refill  . acetaminophen (TYLENOL) 500 MG tablet Take 500 mg by mouth every 4 (four) hours as needed (pain).     Marland Kitchen albuterol (PROVENTIL HFA;VENTOLIN HFA) 108 (90 BASE) MCG/ACT inhaler Inhale 2 puffs into the lungs daily as needed for wheezing.     Marland Kitchen albuterol (PROVENTIL) (2.5 MG/3ML) 0.083% nebulizer solution Take 2.5 mg by nebulization every 2 (two) hours as needed for wheezing.     . Alum Hydroxide-Mag Carbonate (GAVISCON PO) Take 1-2 tablets by mouth at bedtime.    . bacitracin ointment Apply 1 application topically 2 (two) times daily.    . baclofen (LIORESAL) 5 mg TABS tablet Take 5 mg by mouth 2 (two) times daily as needed for muscle spasms.     . chlorpheniramine (CHLOR-TRIMETON) 4 MG tablet Take 4 mg by mouth daily as needed for allergies.    . Cholecalciferol (VITAMIN D-3) 5000 UNITS TABS Take 5,000 Units by mouth  daily.     . DULoxetine (CYMBALTA) 60 MG capsule Take 60 mg by mouth 2 (two) times daily.     Marland Kitchen estradiol (ESTRACE) 2 MG tablet Take 1 tablet (2 mg total) by mouth daily. 90 tablet 3  . folic acid (FOLVITE) 1 MG tablet Take 1 mg by mouth 2 (two) times daily.    Marland Kitchen gabapentin (NEURONTIN) 100 MG capsule Take 300 mg by mouth at bedtime. Can take up to 627m daily as needed    . Linaclotide (LINZESS) 145 MCG CAPS Take 145 mcg by mouth every other day as needed.     .Marland KitchenLORazepam (ATIVAN) 1 MG tablet Take 1 tablet by mouth 2 (two) times daily as needed.    . metFORMIN (GLUCOPHAGE-XR) 750 MG 24 hr tablet Take 1  tablet by mouth daily.    . pantoprazole (PROTONIX) 40 MG tablet Take 40 mg by mouth daily.     . potassium chloride (KLOR-CON) 20 MEQ packet Take 60 mEq by mouth daily.    . pseudoephedrine (SUDAFED) 120 MG 12 hr tablet Take 120 mg by mouth daily as needed for congestion.    .Marland KitchenrOPINIRole (REQUIP) 4 MG tablet Take 4 mg by mouth 2 (two) times daily.     .Marland Kitchentorsemide (DEMADEX) 20 MG tablet Take 3 tablets (60 mg total) by mouth daily. 180 tablet 6  . Umeclidinium-Vilanterol (ANORO ELLIPTA) 62.5-25 MCG/INH AEPB Inhale 1 puff into the lungs daily. 60 each 6  . VICTOZA 18 MG/3ML SOPN Inject 12 mLs into the skin at bedtime.   11   No current facility-administered medications for this visit.    Allergies:   Almond oil; Ceclor; Levaquin; and Sulfa antibiotics   Social History:  The patient  reports that she has never smoked. She does not have any smokeless tobacco history on file. She reports that she does not drink alcohol or use illicit drugs.   Family History:  The patient's family history includes COPD in her father; Dementia in her mother; Heart disease in her father and mother; Hypertension in her father and mother.    ROS:  Please see the history of present illness.  Otherwise, review of systems is positive for weight gain, anxiety, back pain.  All other systems are reviewed and negative.    PHYSICAL EXAM: VS:  BP 116/82 mmHg  Pulse 87  Ht 5' 5.25" (1.657 m)  Wt 277 lb 6.4 oz (125.828 kg)  BMI 45.83 kg/m2  SpO2 97% , BMI Body mass index is 45.83 kg/(m^2). GEN: Obese, in no acute distress HEENT: normal Neck: no JVD, no masses, no carotid bruits Cardiac: RRR without murmur or gallop                Respiratory:  clear to auscultation bilaterally, normal work of breathing GI: soft, nontender, obese, + BS MS: no deformity or atrophy Ext: no pretibial edema Skin: warm and dry, no rash Neuro:  Strength and sensation are intact Psych: euthymic mood, full affect  EKG:  EKG is not ordered  today.  Recent Labs: 11/06/2013: Pro B Natriuretic peptide (BNP) 22.0 12/14/2013: Hemoglobin 10.0*; Platelets 421* 12/19/2013: BUN 16; Creatinine 1.12*; Potassium 3.9; Sodium 140   Lipid Panel     Component Value Date/Time   CHOL 236* 03/25/2013 0500   TRIG 345* 03/25/2013 0500   HDL 34* 03/25/2013 0500   CHOLHDL 6.9 03/25/2013 0500   VLDL 69* 03/25/2013 0500   LDLCALC 133* 03/25/2013 0500      Wt Readings  from Last 3 Encounters:  05/11/14 277 lb 6.4 oz (125.828 kg)  02/20/14 269 lb (122.018 kg)  12/19/13 277 lb 4 oz (125.76 kg)     Cardiac Studies Reviewed: Right heart cath 12/14/13>> Normal left heart filling pressure, normal pulmonary artery pressure. Mildly elevated right atrial pressure.  ASSESSMENT AND PLAN: Chronic Diastolic Heart Failure: NYHA Function Class 2. Shortness of breath is thought to be multifactorial related to chronic lung disease, obesity, and chronic diastolic heart failure. Her volume status is good and she will continue on her current diuretic dose without changes. LV systolic function is normal. Hemodynamics from recent right heart catheterization were reviewed. Lengthy discussion regarding the importance of dietary modification and weight loss as well as continued efforts at exercise program. I will see her back in 6 months.  Current medicines are reviewed with the patient today.  The patient does not have concerns regarding medicines.  The following changes have been made:  no change  Labs/ tests ordered today include:  No orders of the defined types were placed in this encounter.   Disposition:   FU in 6 months  Signed, [name], MD  05/11/2014 3:41 PM    [facility] [address], [place], [place]  [address] Phone: [phone]; Fax: [phone]

## 2014-06-04 ENCOUNTER — Other Ambulatory Visit: Payer: Self-pay | Admitting: Cardiovascular Disease

## 2014-10-09 ENCOUNTER — Encounter: Payer: Self-pay | Admitting: Critical Care Medicine

## 2014-10-09 ENCOUNTER — Ambulatory Visit (INDEPENDENT_AMBULATORY_CARE_PROVIDER_SITE_OTHER): Payer: BLUE CROSS/BLUE SHIELD | Admitting: Critical Care Medicine

## 2014-10-09 VITALS — BP 138/76 | HR 91 | Temp 98.4°F | Ht 65.75 in | Wt 271.0 lb

## 2014-10-09 DIAGNOSIS — L739 Follicular disorder, unspecified: Secondary | ICD-10-CM | POA: Insufficient documentation

## 2014-10-09 DIAGNOSIS — J984 Other disorders of lung: Secondary | ICD-10-CM

## 2014-10-09 MED ORDER — AMOXICILLIN 250 MG PO CHEW: 250.0000 mg | CHEWABLE_TABLET | Freq: Three times a day (TID) | ORAL | Status: DC

## 2014-10-09 NOTE — Patient Instructions (Addendum)
Stay on cpap at night Stay on Anoro Use albuterol as needed Take keflex amoxicil 263m three times daily for 5 days (generic, sent to pharmacy) No other changes Return 4 months

## 2014-10-09 NOTE — Progress Notes (Signed)
Subjective:    Patient ID: Kathleen Hatfield, female    DOB: 1955-06-17, 59 y.o.   MRN: [id number]  HPI 10/09/2014 Chief Complaint  Patient presents with  . Follow-up    SOB, chest tightness, cramp in chest from breathing hard.  Hot weather causing symptoms to get worse.    Pt feels weak today only.  No passing out spells.  Stays out of the heat.  Notes chest tightness and is worse.  No real mucus. Cramping in chest . Notes nasal congestion  Pt denies any significant sore throat,  or excess secretions, fever, chills, sweats, unintended weight loss, pleurtic or exertional chest pain, orthopnea PND, or leg swelling Pt denies any increase in rescue therapy over baseline, denies waking up needing it or having any early am or nocturnal exacerbations of coughing/wheezing/or dyspnea. Pt notes  fluctuation in symptoms with  Weather esp the heat recently  Under a lot of stress and anxiety  Current Medications, Allergies, Complete Past Medical History, Past Surgical History, Family History, and Social History were reviewed in [facility] record per todays encounter:  10/09/2014   Review of Systems  Constitutional: Negative.   HENT: Positive for postnasal drip, sinus pressure and sneezing. Negative for ear pain, rhinorrhea, sore throat, trouble swallowing and voice change.   Eyes: Negative.   Respiratory: Positive for chest tightness and shortness of breath. Negative for apnea, cough, choking, wheezing and stridor.   Cardiovascular: Negative.  Negative for chest pain, palpitations and leg swelling.  Gastrointestinal: Negative.  Negative for nausea, vomiting, abdominal pain and abdominal distention.  Genitourinary: Negative.   Musculoskeletal: Negative.  Negative for myalgias and arthralgias.  Skin: Negative.  Negative for rash.  Allergic/Immunologic: Negative.  Negative for environmental allergies and food allergies.  Neurological: Negative.  Negative for dizziness, syncope,  weakness and headaches.  Hematological: Negative.  Negative for adenopathy. Does not bruise/bleed easily.  Psychiatric/Behavioral: Negative.  Negative for sleep disturbance and agitation. The patient is not nervous/anxious.        Objective:   Physical Exam Filed Vitals:   10/09/14 1037  BP: 138/76  Pulse: 91  Temp: 98.4 F (36.9 C)  TempSrc: Oral  Height: 5' 5.75" (1.67 m)  Weight: 271 lb (122.925 kg)  SpO2: 97%    Gen: Pleasant, well-nourished, in no distress,  normal affect  ENT: No lesions,  mouth clear,  oropharynx clear, no postnasal drip  Neck: No JVD, no TMG, no carotid bruits  Lungs: No use of accessory muscles, no dullness to percussion, distant BS, poor airflow  Cardiovascular: RRR, heart sounds normal, no murmur or gallops, no peripheral edema  Abdomen: soft and NT, no HSM,  BS normal  Musculoskeletal: No deformities, no cyanosis or clubbing  Neuro: alert, non focal  Skin: Warm, no lesions or rashes  No results found.   amb sats: one lap: HR up to 120  No desat, stopped d/t weakness and dizzinedd     Assessment & Plan:  I personally reviewed all images and lab data in the [facility] system as well as any outside material available during this office visit and agree with the  radiology impressions.   Small airways disease Small airways disease with fixed airflow obstruction stable on current bronchial dilator program Note patient has significant fatigue and associated dizziness and is on multiple high risk medications that are likely contributing to the patient's level of fatigue and dizziness Plan Continue anoro Ellipta No need for oxygen therapy day or night Concern  with drug interactions and need to reassess the use of ropinirole, gabapentin, and lorazepam in combination with this patient    Folliculitis Folliculitis of the left nares Will prescribe amoxicillin 250 mg 3 times daily for 5 days if unimproved the patient's should seek care with primary  care   Amylee was seen today for follow-up.  Diagnoses and all orders for this visit:  Small airways disease  Folliculitis  Other orders -     amoxicillin (AMOXIL) 250 MG chewable tablet; Chew 1 tablet (250 mg total) by mouth 3 (three) times daily.

## 2014-10-09 NOTE — Assessment & Plan Note (Addendum)
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

## 2014-10-09 NOTE — Assessment & Plan Note (Signed)
Folliculitis of the left nares Will prescribe amoxicillin 250 mg 3 times daily for 5 days if unimproved the patient's should seek care with primary care

## 2014-11-02 ENCOUNTER — Other Ambulatory Visit: Payer: Self-pay | Admitting: Critical Care Medicine

## 2014-11-02 MED ORDER — ALBUTEROL SULFATE (2.5 MG/3ML) 0.083% IN NEBU: 2.5000 mg | INHALATION_SOLUTION | RESPIRATORY_TRACT | Status: DC | PRN

## 2014-11-02 MED ORDER — ALBUTEROL SULFATE HFA 108 (90 BASE) MCG/ACT IN AERS: 2.0000 | INHALATION_SPRAY | RESPIRATORY_TRACT | Status: DC | PRN

## 2014-11-02 NOTE — Telephone Encounter (Signed)
Received refill request for albuterol inhaler and albuterol neb. Refill was filled per PW's last OV. Nothing further needed

## 2014-11-05 ENCOUNTER — Telehealth: Payer: Self-pay | Admitting: Critical Care Medicine

## 2014-11-05 MED ORDER — ALBUTEROL SULFATE (2.5 MG/3ML) 0.083% IN NEBU: 2.5000 mg | INHALATION_SOLUTION | RESPIRATORY_TRACT | Status: DC | PRN

## 2014-11-05 NOTE — Telephone Encounter (Signed)
Called and spoke to [employer] at [facility] [employer]. Pharmacy tech requesting clarification on mL amount for albuterol neb (orginally 60m). Changed order to a 30 day amount to 1859m Tech verbalized understanding and denied any further questions or concerns at this time.

## 2014-11-15 ENCOUNTER — Telehealth: Payer: Self-pay | Admitting: Critical Care Medicine

## 2014-11-15 NOTE — Telephone Encounter (Signed)
Let pt and insurance co and dme supplier know i reviewed download. Good compliance to cpap and 0 AHI on 13cmh20

## 2014-11-16 NOTE — Telephone Encounter (Signed)
Called, spoke with pt.  Discussed below per Dr. Joya Gaskins.  Pt verbalized understanding.  She is using [facility] for DME.  [employer] is aware of download.  States nothing further is needed for insurance purposes.

## 2014-12-21 ENCOUNTER — Other Ambulatory Visit: Payer: Self-pay | Admitting: Cardiovascular Disease

## 2014-12-24 DIAGNOSIS — E1129 Type 2 diabetes mellitus with other diabetic kidney complication: Secondary | ICD-10-CM | POA: Diagnosis not present

## 2014-12-31 DIAGNOSIS — E1129 Type 2 diabetes mellitus with other diabetic kidney complication: Secondary | ICD-10-CM | POA: Diagnosis not present

## 2014-12-31 DIAGNOSIS — Z23 Encounter for immunization: Secondary | ICD-10-CM | POA: Diagnosis not present

## 2015-01-23 ENCOUNTER — Other Ambulatory Visit: Payer: Self-pay | Admitting: Cardiovascular Disease

## 2015-01-30 DIAGNOSIS — R1084 Generalized abdominal pain: Secondary | ICD-10-CM | POA: Diagnosis not present

## 2015-02-23 DIAGNOSIS — M5090 Cervical disc disorder, unspecified, unspecified cervical region: Secondary | ICD-10-CM | POA: Diagnosis not present

## 2015-02-23 DIAGNOSIS — M50022 Cervical disc disorder at C5-C6 level with myelopathy: Secondary | ICD-10-CM | POA: Diagnosis not present

## 2015-02-23 DIAGNOSIS — M50023 Cervical disc disorder at C6-C7 level with myelopathy: Secondary | ICD-10-CM | POA: Diagnosis not present

## 2015-02-23 DIAGNOSIS — M50021 Cervical disc disorder at C4-C5 level with myelopathy: Secondary | ICD-10-CM | POA: Diagnosis not present

## 2015-03-12 DIAGNOSIS — E1129 Type 2 diabetes mellitus with other diabetic kidney complication: Secondary | ICD-10-CM | POA: Diagnosis not present

## 2015-03-13 DIAGNOSIS — M50023 Cervical disc disorder at C6-C7 level with myelopathy: Secondary | ICD-10-CM | POA: Diagnosis not present

## 2015-03-13 DIAGNOSIS — M50021 Cervical disc disorder at C4-C5 level with myelopathy: Secondary | ICD-10-CM | POA: Diagnosis not present

## 2015-03-13 DIAGNOSIS — M5136 Other intervertebral disc degeneration, lumbar region: Secondary | ICD-10-CM | POA: Diagnosis not present

## 2015-03-13 DIAGNOSIS — M50022 Cervical disc disorder at C5-C6 level with myelopathy: Secondary | ICD-10-CM | POA: Diagnosis not present

## 2015-03-13 DIAGNOSIS — M5090 Cervical disc disorder, unspecified, unspecified cervical region: Secondary | ICD-10-CM | POA: Diagnosis not present

## 2015-03-15 DIAGNOSIS — M50021 Cervical disc disorder at C4-C5 level with myelopathy: Secondary | ICD-10-CM | POA: Diagnosis not present

## 2015-03-15 DIAGNOSIS — M5136 Other intervertebral disc degeneration, lumbar region: Secondary | ICD-10-CM | POA: Diagnosis not present

## 2015-03-15 DIAGNOSIS — M50022 Cervical disc disorder at C5-C6 level with myelopathy: Secondary | ICD-10-CM | POA: Diagnosis not present

## 2015-03-15 DIAGNOSIS — M5090 Cervical disc disorder, unspecified, unspecified cervical region: Secondary | ICD-10-CM | POA: Diagnosis not present

## 2015-03-15 DIAGNOSIS — M50023 Cervical disc disorder at C6-C7 level with myelopathy: Secondary | ICD-10-CM | POA: Diagnosis not present

## 2015-03-20 DIAGNOSIS — M50023 Cervical disc disorder at C6-C7 level with myelopathy: Secondary | ICD-10-CM | POA: Diagnosis not present

## 2015-03-20 DIAGNOSIS — M5136 Other intervertebral disc degeneration, lumbar region: Secondary | ICD-10-CM | POA: Diagnosis not present

## 2015-03-20 DIAGNOSIS — M50022 Cervical disc disorder at C5-C6 level with myelopathy: Secondary | ICD-10-CM | POA: Diagnosis not present

## 2015-03-20 DIAGNOSIS — M5090 Cervical disc disorder, unspecified, unspecified cervical region: Secondary | ICD-10-CM | POA: Diagnosis not present

## 2015-03-20 DIAGNOSIS — M50021 Cervical disc disorder at C4-C5 level with myelopathy: Secondary | ICD-10-CM | POA: Diagnosis not present

## 2015-03-22 DIAGNOSIS — M5136 Other intervertebral disc degeneration, lumbar region: Secondary | ICD-10-CM | POA: Diagnosis not present

## 2015-03-22 DIAGNOSIS — E782 Mixed hyperlipidemia: Secondary | ICD-10-CM | POA: Diagnosis not present

## 2015-03-22 DIAGNOSIS — M50021 Cervical disc disorder at C4-C5 level with myelopathy: Secondary | ICD-10-CM | POA: Diagnosis not present

## 2015-03-22 DIAGNOSIS — M5090 Cervical disc disorder, unspecified, unspecified cervical region: Secondary | ICD-10-CM | POA: Diagnosis not present

## 2015-03-22 DIAGNOSIS — M50023 Cervical disc disorder at C6-C7 level with myelopathy: Secondary | ICD-10-CM | POA: Diagnosis not present

## 2015-03-22 DIAGNOSIS — E1129 Type 2 diabetes mellitus with other diabetic kidney complication: Secondary | ICD-10-CM | POA: Diagnosis not present

## 2015-03-22 DIAGNOSIS — M50022 Cervical disc disorder at C5-C6 level with myelopathy: Secondary | ICD-10-CM | POA: Diagnosis not present

## 2015-03-22 DIAGNOSIS — E1165 Type 2 diabetes mellitus with hyperglycemia: Secondary | ICD-10-CM | POA: Diagnosis not present

## 2015-03-25 ENCOUNTER — Other Ambulatory Visit: Payer: Self-pay | Admitting: Critical Care Medicine

## 2015-03-25 DIAGNOSIS — M5090 Cervical disc disorder, unspecified, unspecified cervical region: Secondary | ICD-10-CM | POA: Diagnosis not present

## 2015-03-25 DIAGNOSIS — M50021 Cervical disc disorder at C4-C5 level with myelopathy: Secondary | ICD-10-CM | POA: Diagnosis not present

## 2015-03-25 DIAGNOSIS — M50023 Cervical disc disorder at C6-C7 level with myelopathy: Secondary | ICD-10-CM | POA: Diagnosis not present

## 2015-03-25 DIAGNOSIS — M5136 Other intervertebral disc degeneration, lumbar region: Secondary | ICD-10-CM | POA: Diagnosis not present

## 2015-03-25 DIAGNOSIS — M50022 Cervical disc disorder at C5-C6 level with myelopathy: Secondary | ICD-10-CM | POA: Diagnosis not present

## 2015-03-26 ENCOUNTER — Other Ambulatory Visit: Payer: Self-pay | Admitting: Internal Medicine

## 2015-03-27 DIAGNOSIS — M5136 Other intervertebral disc degeneration, lumbar region: Secondary | ICD-10-CM | POA: Diagnosis not present

## 2015-03-27 DIAGNOSIS — M5090 Cervical disc disorder, unspecified, unspecified cervical region: Secondary | ICD-10-CM | POA: Diagnosis not present

## 2015-03-27 DIAGNOSIS — M50023 Cervical disc disorder at C6-C7 level with myelopathy: Secondary | ICD-10-CM | POA: Diagnosis not present

## 2015-03-27 DIAGNOSIS — M50021 Cervical disc disorder at C4-C5 level with myelopathy: Secondary | ICD-10-CM | POA: Diagnosis not present

## 2015-03-27 DIAGNOSIS — M50022 Cervical disc disorder at C5-C6 level with myelopathy: Secondary | ICD-10-CM | POA: Diagnosis not present

## 2015-03-29 DIAGNOSIS — M5136 Other intervertebral disc degeneration, lumbar region: Secondary | ICD-10-CM | POA: Diagnosis not present

## 2015-03-29 DIAGNOSIS — M50021 Cervical disc disorder at C4-C5 level with myelopathy: Secondary | ICD-10-CM | POA: Diagnosis not present

## 2015-03-29 DIAGNOSIS — M50022 Cervical disc disorder at C5-C6 level with myelopathy: Secondary | ICD-10-CM | POA: Diagnosis not present

## 2015-03-29 DIAGNOSIS — M5090 Cervical disc disorder, unspecified, unspecified cervical region: Secondary | ICD-10-CM | POA: Diagnosis not present

## 2015-03-29 DIAGNOSIS — M50023 Cervical disc disorder at C6-C7 level with myelopathy: Secondary | ICD-10-CM | POA: Diagnosis not present

## 2015-04-01 DIAGNOSIS — M50021 Cervical disc disorder at C4-C5 level with myelopathy: Secondary | ICD-10-CM | POA: Diagnosis not present

## 2015-04-01 DIAGNOSIS — M5136 Other intervertebral disc degeneration, lumbar region: Secondary | ICD-10-CM | POA: Diagnosis not present

## 2015-04-01 DIAGNOSIS — M50022 Cervical disc disorder at C5-C6 level with myelopathy: Secondary | ICD-10-CM | POA: Diagnosis not present

## 2015-04-01 DIAGNOSIS — M5090 Cervical disc disorder, unspecified, unspecified cervical region: Secondary | ICD-10-CM | POA: Diagnosis not present

## 2015-04-01 DIAGNOSIS — M50023 Cervical disc disorder at C6-C7 level with myelopathy: Secondary | ICD-10-CM | POA: Diagnosis not present

## 2015-04-02 DIAGNOSIS — M50021 Cervical disc disorder at C4-C5 level with myelopathy: Secondary | ICD-10-CM | POA: Diagnosis not present

## 2015-04-02 DIAGNOSIS — M50023 Cervical disc disorder at C6-C7 level with myelopathy: Secondary | ICD-10-CM | POA: Diagnosis not present

## 2015-04-02 DIAGNOSIS — M5136 Other intervertebral disc degeneration, lumbar region: Secondary | ICD-10-CM | POA: Diagnosis not present

## 2015-04-02 DIAGNOSIS — M50022 Cervical disc disorder at C5-C6 level with myelopathy: Secondary | ICD-10-CM | POA: Diagnosis not present

## 2015-04-11 ENCOUNTER — Other Ambulatory Visit: Payer: Self-pay | Admitting: Internal Medicine

## 2015-04-14 ENCOUNTER — Emergency Department (HOSPITAL_COMMUNITY)
Admission: EM | Admit: 2015-04-14 | Discharge: 2015-04-14 | Disposition: A | Discharge status: Home / Self Care | Payer: Medicare HMO | Attending: Emergency Medicine | Admitting: Emergency Medicine

## 2015-04-14 ENCOUNTER — Emergency Department (HOSPITAL_COMMUNITY): Payer: Medicare HMO

## 2015-04-14 ENCOUNTER — Encounter (HOSPITAL_COMMUNITY): Payer: Self-pay | Admitting: Emergency Medicine

## 2015-04-14 DIAGNOSIS — I5032 Chronic diastolic (congestive) heart failure: Secondary | ICD-10-CM | POA: Insufficient documentation

## 2015-04-14 DIAGNOSIS — Z9889 Other specified postprocedural states: Secondary | ICD-10-CM | POA: Diagnosis not present

## 2015-04-14 DIAGNOSIS — E669 Obesity, unspecified: Secondary | ICD-10-CM | POA: Insufficient documentation

## 2015-04-14 DIAGNOSIS — E785 Hyperlipidemia, unspecified: Secondary | ICD-10-CM | POA: Insufficient documentation

## 2015-04-14 DIAGNOSIS — F329 Major depressive disorder, single episode, unspecified: Secondary | ICD-10-CM | POA: Insufficient documentation

## 2015-04-14 DIAGNOSIS — I10 Essential (primary) hypertension: Secondary | ICD-10-CM | POA: Insufficient documentation

## 2015-04-14 DIAGNOSIS — Z79899 Other long term (current) drug therapy: Secondary | ICD-10-CM | POA: Diagnosis not present

## 2015-04-14 DIAGNOSIS — J449 Chronic obstructive pulmonary disease, unspecified: Secondary | ICD-10-CM | POA: Insufficient documentation

## 2015-04-14 DIAGNOSIS — K439 Ventral hernia without obstruction or gangrene: Secondary | ICD-10-CM | POA: Diagnosis not present

## 2015-04-14 DIAGNOSIS — R109 Unspecified abdominal pain: Secondary | ICD-10-CM

## 2015-04-14 DIAGNOSIS — K219 Gastro-esophageal reflux disease without esophagitis: Secondary | ICD-10-CM | POA: Insufficient documentation

## 2015-04-14 DIAGNOSIS — Z792 Long term (current) use of antibiotics: Secondary | ICD-10-CM | POA: Insufficient documentation

## 2015-04-14 DIAGNOSIS — Z7984 Long term (current) use of oral hypoglycemic drugs: Secondary | ICD-10-CM | POA: Diagnosis not present

## 2015-04-14 DIAGNOSIS — R1032 Left lower quadrant pain: Secondary | ICD-10-CM | POA: Diagnosis not present

## 2015-04-14 DIAGNOSIS — R69 Illness, unspecified: Secondary | ICD-10-CM | POA: Diagnosis not present

## 2015-04-14 HISTORY — DX: Diverticulitis of intestine, part unspecified, without perforation or abscess without bleeding: K57.92

## 2015-04-14 LAB — COMPREHENSIVE METABOLIC PANEL
ALT: 27 U/L (ref 14–54)
AST: 38 U/L (ref 15–41)
Albumin: 3.7 g/dL (ref 3.5–5.0)
Alkaline Phosphatase: 53 U/L (ref 38–126)
Anion gap: 11 (ref 5–15)
BUN: 14 mg/dL (ref 6–20)
CO2: 29 mmol/L (ref 22–32)
Calcium: 9.8 mg/dL (ref 8.9–10.3)
Chloride: 99 mmol/L — ABNORMAL LOW (ref 101–111)
Creatinine, Ser: 1.22 mg/dL — ABNORMAL HIGH (ref 0.44–1.00)
GFR calc Af Amer: 55 mL/min — ABNORMAL LOW (ref >60)
GFR calc non Af Amer: 47 mL/min — ABNORMAL LOW (ref >60)
Glucose, Bld: 115 mg/dL — ABNORMAL HIGH (ref 65–99)
Potassium: 4 mmol/L (ref 3.5–5.1)
Sodium: 139 mmol/L (ref 135–145)
Total Bilirubin: 0.5 mg/dL (ref 0.3–1.2)
Total Protein: 7.3 g/dL (ref 6.5–8.1)

## 2015-04-14 LAB — CBC
HCT: 34.6 % — ABNORMAL LOW (ref 36.0–46.0)
Hemoglobin: 11.1 g/dL — ABNORMAL LOW (ref 12.0–15.0)
MCH: 26 pg (ref 26.0–34.0)
MCHC: 32.1 g/dL (ref 30.0–36.0)
MCV: 81 fL (ref 78.0–100.0)
Platelets: 373 10*3/uL (ref 150–400)
RBC: 4.27 MIL/uL (ref 3.87–5.11)
RDW: 16.7 % — ABNORMAL HIGH (ref 11.5–15.5)
WBC: 10.5 10*3/uL (ref 4.0–10.5)

## 2015-04-14 LAB — URINALYSIS, ROUTINE W REFLEX MICROSCOPIC
Bilirubin Urine: NEGATIVE
Glucose, UA: NEGATIVE mg/dL
Hgb urine dipstick: NEGATIVE
Ketones, ur: NEGATIVE mg/dL
Leukocytes, UA: NEGATIVE
Nitrite: NEGATIVE
Protein, ur: NEGATIVE mg/dL
Specific Gravity, Urine: 1.021 (ref 1.005–1.030)
pH: 7.5 (ref 5.0–8.0)

## 2015-04-14 LAB — LIPASE, BLOOD: Lipase: 20 U/L (ref 11–51)

## 2015-04-14 MED ORDER — OXYCODONE-ACETAMINOPHEN 5-325 MG PO TABS: 1.0000 | ORAL_TABLET | Freq: Four times a day (QID) | ORAL | Status: DC | PRN

## 2015-04-14 MED ORDER — MORPHINE SULFATE (PF) 4 MG/ML IV SOLN
4.0000 mg | Freq: Once | INTRAVENOUS | Status: AC
  Administered 2015-04-14: 4 mg via INTRAVENOUS
  Filled 2015-04-14: qty 1

## 2015-04-14 MED ORDER — OXYCODONE-ACETAMINOPHEN 5-325 MG PO TABS
1.0000 | ORAL_TABLET | Freq: Once | ORAL | Status: AC
  Administered 2015-04-14: 1 via ORAL
  Filled 2015-04-14: qty 1

## 2015-04-14 MED ORDER — SODIUM CHLORIDE 0.9 % IV BOLUS (SEPSIS)
1000.0000 mL | Freq: Once | INTRAVENOUS | Status: AC
  Administered 2015-04-14: 1000 mL via INTRAVENOUS

## 2015-04-14 MED ORDER — IOHEXOL 300 MG/ML  SOLN
100.0000 mL | Freq: Once | INTRAMUSCULAR | Status: AC | PRN
  Administered 2015-04-14: 100 mL via INTRAVENOUS

## 2015-04-14 MED ORDER — ONDANSETRON HCL 4 MG/2ML IJ SOLN
4.0000 mg | Freq: Once | INTRAMUSCULAR | Status: AC
  Administered 2015-04-14: 4 mg via INTRAVENOUS
  Filled 2015-04-14: qty 2

## 2015-04-14 MED ORDER — DICYCLOMINE HCL 20 MG PO TABS: 20.0000 mg | ORAL_TABLET | Freq: Two times a day (BID) | ORAL | Status: DC

## 2015-04-14 NOTE — ED Provider Notes (Signed)
CSN: [id number]     Arrival date & time 04/14/15  1147 History   First MD Initiated Contact with Patient 04/14/15 1435     Chief Complaint  Patient presents with  . Flank Pain  . Abdominal Pain    HPI  Kathleen Hatfield is an 60 y.o. female with history of HTN, CHF, COPD, GERD, UC, diverticulosis, s/p sigmoidectomy who presents to the ED for evaluation of LLQ and left flank pain. She states her pain began last night around 8PM as she was driving home. She states the pain felt like a cramp or a gas pain. She states she tried some gas medicine with no relief. States the pain has progressively become worse. She states the pain feels like a constant dull pain that will flare up and feel like somebody is stabbing her. She states she has tried tramadol with no relief. Denies associated n/v/d, fever, chills. Denies h/o kidney stones. Denies dysuria, urinary frequency, urinary urgency. Denies vaginal complaints. She is s/p appendectomy, cholecystectomy.   Past Medical History  Diagnosis Date  . Depression   . Hypertension   . Chronic diastolic heart failure ([place])   . Dyspnea   . Asthma   . GERD (gastroesophageal reflux disease)   . Hyperlipidemia     cannot tolerate statins  . Ulcerative colitis ([place])     dr Collene Mares  . Diverticulitis    Past Surgical History  Procedure Laterality Date  . Abdominal hysterectomy    . Cholecystectomy    . Cesarean section    . Appendectomy    . Sigmoidectomy  2010    diverticular disease  . Right heart catheterization N/A 02/27/2013    Procedure: RIGHT HEART CATH;  Surgeon: Blane Ohara, MD;  Location: [facility] CATH LAB;  Service: Cardiovascular;  Laterality: N/A;  . Right heart catheterization N/A 12/14/2013    Procedure: RIGHT HEART CATH;  Surgeon: Larey Dresser, MD;  Location: [facility] CATH LAB;  Service: Cardiovascular;  Laterality: N/A;   Family History  Problem Relation Age of Onset  . Heart disease Mother   . Hypertension Mother   . Dementia Mother   . Heart  disease Father   . COPD Father   . Hypertension Father    Social History  Substance Use Topics  . Smoking status: Never Smoker   . Smokeless tobacco: None  . Alcohol Use: No   OB History    No data available     Review of Systems  All other systems reviewed and are negative.     Allergies  Almond oil; Ceclor; Levaquin; and Sulfa antibiotics  Home Medications   Prior to Admission medications   Medication Sig Start Date End Date Taking? Authorizing Provider  acetaminophen (TYLENOL) 500 MG tablet Take 500 mg by mouth every 4 (four) hours as needed (pain).     Historical Provider, MD  albuterol (PROVENTIL HFA;VENTOLIN HFA) 108 (90 BASE) MCG/ACT inhaler Inhale 2 puffs into the lungs every 4 (four) hours as needed for wheezing. 11/02/14   [name], MD  albuterol (PROVENTIL) (2.5 MG/3ML) 0.083% nebulizer solution Take 3 mLs (2.5 mg total) by nebulization every 2 (two) hours as needed for wheezing. 11/05/14   [name], MD  Alum Hydroxide-Mag Carbonate (GAVISCON PO) Take 1-2 tablets by mouth at bedtime.    Historical Provider, MD  Celedonio Miyamoto 62.5-25 MCG/INH AEPB INHALE 1 PUFF ONCE DAILY 04/11/15   Tanda Rockers, MD  bacitracin ointment Apply 1 application topically 2 (two)  times daily.    Historical Provider, MD  baclofen (LIORESAL) 5 mg TABS tablet Take 5 mg by mouth 2 (two) times daily as needed for muscle spasms.  03/27/13   Dixon Boos, MD  chlorpheniramine (CHLOR-TRIMETON) 4 MG tablet Take 4 mg by mouth daily as needed for allergies.    Historical Provider, MD  Cholecalciferol (VITAMIN D-3) 5000 UNITS TABS Take 5,000 Units by mouth daily.     Historical Provider, MD  DULoxetine (CYMBALTA) 60 MG capsule Take 60 mg by mouth 2 (two) times daily.     Historical Provider, MD  estradiol (ESTRACE) 2 MG tablet Take 1 tablet (2 mg total) by mouth daily. 07/29/11   Delsa Bern, MD  fenofibrate (TRICOR) 145 MG tablet  09/28/14   Historical Provider, MD  folic acid  (FOLVITE) 1 MG tablet Take 1 mg by mouth 2 (two) times daily.    Historical Provider, MD  gabapentin (NEURONTIN) 100 MG capsule Take 300 mg by mouth at bedtime. Can take up to 645m daily as needed    Historical Provider, MD  Linaclotide (LINZESS) 145 MCG CAPS Take 145 mcg by mouth every other day as needed.     Historical Provider, MD  LORazepam (ATIVAN) 1 MG tablet Take 1 tablet by mouth 2 (two) times daily as needed.    Historical Provider, MD  losartan (COZAAR) 50 MG tablet Take 50 mg by mouth daily as needed. 03/25/15   Historical Provider, MD  metFORMIN (GLUCOPHAGE-XR) 750 MG 24 hr tablet Take 1 tablet by mouth daily.    Historical Provider, MD  methylphenidate (RITALIN) 10 MG tablet  09/28/14   Historical Provider, MD  pantoprazole (PROTONIX) 40 MG tablet Take 40 mg by mouth daily.  04/14/12   Historical Provider, MD  potassium chloride (K-DUR,KLOR-CON) 10 MEQ tablet Take 20 mEq by mouth daily. 03/25/15   Historical Provider, MD  pseudoephedrine (SUDAFED) 120 MG 12 hr tablet Take 120 mg by mouth daily as needed for congestion.    Historical Provider, MD  rOPINIRole (REQUIP) 4 MG tablet Take 4 mg by mouth 2 (two) times daily.     Historical Provider, MD  rosuvastatin (CRESTOR) 10 MG tablet Take 10 mg by mouth once a week. On Saturday or Sunday 03/25/15   Historical Provider, MD  torsemide (DEMADEX) 20 MG tablet TAKE 3 TABLETS DAILY 12/24/14   MSherren Mocha MD  VICTOZA 18 MG/3ML SOPN Inject 12 mLs into the skin at bedtime.  05/08/14   Historical Provider, MD   BP 135/81 mmHg  Pulse 69  Temp(Src) 98.1 F (36.7 C) (Oral)  Resp 18  SpO2 99% Physical Exam  Constitutional: She is oriented to person, place, and time.  Obese, tearful, NAD  HENT:  Right Ear: External ear normal.  Left Ear: External ear normal.  Nose: Nose normal.  Mouth/Throat: Oropharynx is clear and moist. No oropharyngeal exudate.  Eyes: Conjunctivae and EOM are normal. Pupils are equal, round, and reactive to light.  Neck:  Normal range of motion. Neck supple.  Cardiovascular: Normal rate, regular rhythm, normal heart sounds and intact distal pulses.   Pulmonary/Chest: Effort normal and breath sounds normal. No respiratory distress. She has no wheezes. She exhibits no tenderness.  Abdominal: Soft. Bowel sounds are normal. She exhibits no distension. There is no tenderness. There is no rebound and no guarding.  No tenderness. No guarding. Abdomen is soft. No CVA tenderness. No hernias or masses palpable though difficult due to body habitus.   Musculoskeletal: She exhibits no  edema.  Neurological: She is alert and oriented to person, place, and time. No cranial nerve deficit.  Skin: Skin is warm and dry.  Psychiatric: She has a normal mood and affect.  Nursing note and vitals reviewed.   ED Course  Procedures (including critical care time) Labs Review Labs Reviewed  COMPREHENSIVE METABOLIC PANEL - Abnormal; Notable for the following:    Chloride 99 (*)    Glucose, Bld 115 (*)    Creatinine, Ser 1.22 (*)    GFR calc non Af Amer 47 (*)    GFR calc Af Amer 55 (*)    All other components within normal limits  CBC - Abnormal; Notable for the following:    Hemoglobin 11.1 (*)    HCT 34.6 (*)    RDW 16.7 (*)    All other components within normal limits  LIPASE, BLOOD  URINALYSIS, ROUTINE W REFLEX MICROSCOPIC (NOT AT [facility])    Imaging Review Ct Abdomen Pelvis W Contrast  04/14/2015  CLINICAL DATA:  LLQ PAIN SINCE YESTERDAY CONCERN FOR DIVERTICULITIS 100ML [id number] ADMIN IV PMH: ^152m OMNIPAQUE IOHEXOL 300 MG/ML SOLN EXAM: CT ABDOMEN AND PELVIS WITH CONTRAST TECHNIQUE: Multidetector CT imaging of the abdomen and pelvis was performed using the standard protocol following bolus administration of intravenous contrast. CONTRAST:  1037mOMNIPAQUE IOHEXOL 300 MG/ML  SOLN COMPARISON:  CT of the abdomen and pelvis on 09/15/2010 FINDINGS: Lower chest: No pulmonary nodules, pleural effusions, or infiltrates. Heart size is  normal. No imaged pericardial effusion or significant coronary artery calcifications. Upper abdomen: There is diffuse low-attenuation of the liver consistent with hepatic steatosis. The liver is enlarged. No focal liver lesions are identified. No focal abnormality identified within the spleen, pancreas, or adrenal glands. There is symmetric enhancement and excretion in the kidneys bilaterally. No renal lesions or hydronephrosis. Gallbladder is absent. Gastrointestinal tract: Small hiatal hernia is present. The stomach and small bowel loops are normal in appearance. Previous appendectomy. Numerous colonic diverticula are present. No evidence for acute diverticulitis. Pelvis: Uterus is absent.  No adnexal mass.  No free pelvic fluid. Retroperitoneum: There is atherosclerotic calcification of the abdominal aorta. No aneurysm. No retroperitoneal or mesenteric adenopathy. Abdominal wall: There are numerous anterior abdominal wall hernias. The majority of these contain only fat better small. However within the lower pelvis there is a central midline abdominal hernia which contains the dome of the bladder. The Edit this represents a new finding compared with previous CT exam. This could account for the patient's symptoms. Osseous structures: Unremarkable. IMPRESSION: 1. Central anterior midline hernia contains the dome of the bladder and may be symptomatic for the patient. 2. Numerous small midline abdominal hernias containing mesenteric fat and appear uncomplicated. 3. Colonic diverticulosis without evidence for acute diverticulitis. 4. Status post appendectomy. 5. Status post cholecystectomy. 6. Enlarged fatty liver. 7. Small hiatal hernia. Electronically Signed   By: ElNolon Nations.D.   On: 04/14/2015 17:49   I have personally reviewed and evaluated these images and lab results as part of my medical decision-making.   EKG Interpretation None      MDM   Final diagnoses:  Left flank pain  Left lateral  abdominal pain  Ventral hernia without obstruction or gangrene    Labs at baseline. CT shows several anterior midline hernias. There is a new anterior midline hernia that contains the dome of the bladder that radiologist suggests could be causing pt's symptoms. No e/o diverticulitis. Discussed findings with pt. I cannot palpate the hernia on her  abdominal exam due to body habitus. She reports pain relief with morphine. VSS and otherwise nontoxic. At this time no further emergent workup indicated. Referral given to surgery with instructions to f/u with PCP as well. Rx given for percocet and bentyl. Pt has zofran at home. ER return precautions given.     Anne Ng, PA-C 04/14/15 Valerie Roys  Leonard Schwartz, MD 04/15/15 [phone]

## 2015-04-14 NOTE — ED Notes (Signed)
Sent from urgent care in [place] for abd pain-- hx of diverticulitis, ulcerative colitis.

## 2015-04-14 NOTE — Discharge Instructions (Signed)
Your labs were normal. Your CT scan showed evidence of several abdominal hernias including one new hernia in your lower abdomen that contains the top part of your bladder which may be causing your symptoms. Please follow up with your primary care provider this week. I also gave you the contact information for [facility] for follow up as well. Return to the ER for new or worsening symptoms.

## 2015-04-15 ENCOUNTER — Ambulatory Visit (INDEPENDENT_AMBULATORY_CARE_PROVIDER_SITE_OTHER)
Admission: RE | Admit: 2015-04-15 | Discharge: 2015-04-15 | Disposition: A | Discharge status: Home / Self Care | Payer: Medicare HMO | Source: Ambulatory Visit | Attending: Internal Medicine | Admitting: Internal Medicine

## 2015-04-15 ENCOUNTER — Other Ambulatory Visit: Payer: Self-pay | Admitting: Internal Medicine

## 2015-04-15 ENCOUNTER — Ambulatory Visit (INDEPENDENT_AMBULATORY_CARE_PROVIDER_SITE_OTHER): Payer: Medicare HMO | Admitting: Internal Medicine

## 2015-04-15 ENCOUNTER — Other Ambulatory Visit (INDEPENDENT_AMBULATORY_CARE_PROVIDER_SITE_OTHER): Payer: Medicare HMO

## 2015-04-15 ENCOUNTER — Encounter: Payer: Self-pay | Admitting: Internal Medicine

## 2015-04-15 VITALS — BP 160/86 | HR 91 | Ht 65.75 in | Wt 275.0 lb

## 2015-04-15 DIAGNOSIS — R0600 Dyspnea, unspecified: Secondary | ICD-10-CM | POA: Diagnosis not present

## 2015-04-15 DIAGNOSIS — G4733 Obstructive sleep apnea (adult) (pediatric): Secondary | ICD-10-CM | POA: Diagnosis not present

## 2015-04-15 DIAGNOSIS — M50021 Cervical disc disorder at C4-C5 level with myelopathy: Secondary | ICD-10-CM | POA: Diagnosis not present

## 2015-04-15 DIAGNOSIS — M5136 Other intervertebral disc degeneration, lumbar region: Secondary | ICD-10-CM | POA: Diagnosis not present

## 2015-04-15 LAB — CBC WITH DIFFERENTIAL/PLATELET
Basophils Absolute: 0 10*3/uL (ref 0.0–0.1)
Basophils Relative: 0.3 % (ref 0.0–3.0)
Eosinophils Absolute: 0.2 10*3/uL (ref 0.0–0.7)
Eosinophils Relative: 1.6 % (ref 0.0–5.0)
HCT: 33.3 % — ABNORMAL LOW (ref 36.0–46.0)
Hemoglobin: 10.5 g/dL — ABNORMAL LOW (ref 12.0–15.0)
Lymphocytes Relative: 35.5 % (ref 12.0–46.0)
Lymphs Abs: 3.3 10*3/uL (ref 0.7–4.0)
MCHC: 31.6 g/dL (ref 30.0–36.0)
MCV: 80.4 fl (ref 78.0–100.0)
Monocytes Absolute: 0.9 10*3/uL (ref 0.1–1.0)
Monocytes Relative: 9.5 % (ref 3.0–12.0)
Neutro Abs: 5 10*3/uL (ref 1.4–7.7)
Neutrophils Relative %: 53.1 % (ref 43.0–77.0)
Platelets: 422 10*3/uL — ABNORMAL HIGH (ref 150.0–400.0)
RBC: 4.14 Mil/uL (ref 3.87–5.11)
RDW: 17.3 % — ABNORMAL HIGH (ref 11.5–15.5)
WBC: 9.4 10*3/uL (ref 4.0–10.5)

## 2015-04-15 LAB — TSH: TSH: 2.31 u[IU]/mL (ref 0.35–4.50)

## 2015-04-15 LAB — BRAIN NATRIURETIC PEPTIDE: Pro B Natriuretic peptide (BNP): 29 pg/mL (ref 0.0–100.0)

## 2015-04-15 NOTE — Progress Notes (Signed)
Subjective:   Patient ID: Kathleen Hatfield, female    DOB: 1955-06-20,     MRN: [id number]     Brief patient profile:  7 yowf never smoker previously followed by Kathleen Hatfield for "small airways dz" s/p remote eval by Kathleen Hatfield in  1980s > could not tol shots     History of Present Illness  10/09/2014 Kathleen Hatfield last ov  Chief Complaint  Patient presents with  . Follow-up    SOB, chest tightness, cramp in chest from breathing hard.  Hot weather causing symptoms to get worse.    Pt feels weak today only.  No passing out spells.  Stays out of the heat.  Notes chest tightness and is worse.  No real mucus. Cramping in chest . Notes nasal congestion  Pt notes  fluctuation in symptoms with  Weather esp the heat recently  Under a lot of stress and anxiety   04/15/2015 1st   office visit/ Kathleen Hatfield / transition of care  maint rx = anoro Chief Complaint  Patient presents with  . Follow-up    Former PW pt: Uses CPAP nightly, PRN and with naps. DME [employer]. Pt c/o decreased endurance and increased SOB. Notes low O2 levels when laying down.   on cpap hs but not 02  And did poorly off cpap at admit for abd problems from [facility]  Across parking lot-=  [facility] stops when arrives x 5 min then able to shop p several min rest Takes ppi but not ac/ on proair on avg once a daily not sure it helps / never at hs/ notes worse with cat litter dust  Not sure anoro helps either.  No obvious day to day or daytime variability or assoc excess/ purulent sputum or mucus plugs or hemoptysis or cp or chest tightness, subjective wheeze or overt sinus or hb symptoms. No unusual exp hx or h/o childhood pna/ asthma or knowledge of premature birth.  Sleeping ok without nocturnal  or early am exacerbation  of respiratory  c/o's or need for noct saba. Also denies any obvious fluctuation of symptoms with weather or environmental changes or other aggravating or alleviating factors except as outlined above   Current Medications,  Allergies, Complete Past Medical History, Past Surgical History, Family History, and Social History were reviewed in [employer] record.  ROS  The following are not active complaints unless bolded sore throat, dysphagia, dental problems, itching, sneezing,  nasal congestion or excess/ purulent secretions, ear ache,   fever, chills, sweats, unintended wt loss, classically pleuritic or exertional cp,  orthopnea pnd or leg swelling, presyncope, palpitations, abdominal pain, anorexia, nausea, vomiting, diarrhea  or change in bowel or bladder habits, change in stools or urine, dysuria,hematuria,  rash, arthralgias, visual complaints, headache, numbness, weakness or ataxia or problems with walking or coordination,  change in mood/affect or memory.                 Objective:   Physical Exam   Wt Readings from Last 3 Encounters:  04/15/15 275 lb (124.739 kg)  10/09/14 271 lb (122.925 kg)  05/11/14 277 lb 6.4 oz (125.828 kg)    Vital signs reviewed    Gen: Pleasant, well-nourished, in no distress,  normal affect  ENT: No lesions,  mouth clear,  oropharynx clear, no postnasal drip  Neck: No JVD, no TMG, no carotid bruits  Lungs: No use of accessory muscles, no dullness to percussion, distant BS, poor airflow  Cardiovascular: RRR, heart sounds normal, no murmur or gallops, no peripheral edema  Abdomen: soft and NT, no HSM,  BS normal  Musculoskeletal: No deformities, no cyanosis or clubbing  Neuro: alert, non focal  Skin: Warm, no lesions or rashes     CXR PA and Lateral:   04/15/2015 :    I personally reviewed images and agree with radiology impression as follows:    Lungs are mildly hyperexpanded. There are areas of reticular scarring in the apices and left upper lobe lingula. There is no evidence of pneumonia or pulmonary edema. No pleural effusion or Pneumothorax  Labs ordered/ reviewed:      Chemistry      Component Value Date/Time   NA 139  04/14/2015 1256   K 4.0 04/14/2015 1256   CL 99* 04/14/2015 1256   CO2 29 04/14/2015 1256   BUN 14 04/14/2015 1256   CREATININE 1.22* 04/14/2015 1256      Component Value Date/Time   CALCIUM 9.8 04/14/2015 1256   ALKPHOS 53 04/14/2015 1256   AST 38 04/14/2015 1256   ALT 27 04/14/2015 1256   BILITOT 0.5 04/14/2015 1256        Lab Results  Component Value Date   WBC 9.4 04/15/2015   HGB 10.5* 04/15/2015   HCT 33.3* 04/15/2015   MCV 80.4 04/15/2015   PLT 422.0* 04/15/2015        Lab Results  Component Value Date   TSH 2.31 04/15/2015     Lab Results  Component Value Date   PROBNP 29.0 04/15/2015                   Assessment & Plan:

## 2015-04-15 NOTE — Patient Instructions (Signed)
Change your pantoprazole to 40 mg Take 30-60 min before first meal of the day   GERD (REFLUX)  is an extremely common cause of respiratory symptoms just like yours , many times with no obvious heartburn at all.    It can be treated with medication, but also with lifestyle changes including elevation of the head of your bed (ideally with 6 inch  bed blocks),  Smoking cessation, avoidance of late meals, excessive alcohol, and avoid fatty foods, chocolate, peppermint, colas, red wine, and acidic juices such as orange juice.  NO MINT OR MENTHOL PRODUCTS SO NO COUGH DROPS  USE SUGARLESS CANDY INSTEAD (Jolley ranchers or Stover's or Life Savers) or even ice chips will also do - the key is to swallow to prevent all throat clearing. NO OIL BASED VITAMINS - use powdered substitutes.    Only use your albuterol as a rescue medication to be used if you can't catch your breath by resting or doing a relaxed purse lip breathing pattern.  - The less you use it, the better it will work when you need it. - Ok to use up to 2 puffs  every 4 hours if you must but call for immediate appointment if use goes up over your usual need - Don't leave home without it !!  (think of it like the spare tire for your car)   Please remember to go to the lab and x-ray department downstairs for your tests - we will call you with the results when they are available.  Please see patient coordinator before you leave today  to schedule download for your cpap to prove it's correcting your problem     Please schedule a follow up office visit in 6 weeks, call sooner if needed with pfts on return but don't use your anoro that day

## 2015-04-16 ENCOUNTER — Telehealth: Payer: Self-pay | Admitting: Internal Medicine

## 2015-04-16 NOTE — Telephone Encounter (Signed)
Result Notes     Notes Recorded by Rosana Berger, CMA on 04/16/2015 at 10:04 AM [facility] ------  Notes Recorded by Tanda Rockers, MD on 04/16/2015 at 6:31 AM Call pt: Reviewed cxr and no acute change so no change in recommendations made at [facility]   lmtcb x1 for pt.

## 2015-04-16 NOTE — Progress Notes (Signed)
Quick Note:  LMTCB ______ 

## 2015-04-17 NOTE — Telephone Encounter (Signed)
Spoke with pt and notified of results per Dr. Wert. Pt verbalized understanding and denied any questions. 

## 2015-04-17 NOTE — Telephone Encounter (Signed)
Patient Returned call [phone]

## 2015-04-18 ENCOUNTER — Encounter: Payer: Self-pay | Admitting: Internal Medicine

## 2015-04-18 DIAGNOSIS — M50022 Cervical disc disorder at C5-C6 level with myelopathy: Secondary | ICD-10-CM | POA: Diagnosis not present

## 2015-04-18 DIAGNOSIS — M50023 Cervical disc disorder at C6-C7 level with myelopathy: Secondary | ICD-10-CM | POA: Diagnosis not present

## 2015-04-18 DIAGNOSIS — R059 Cough, unspecified: Secondary | ICD-10-CM | POA: Insufficient documentation

## 2015-04-18 DIAGNOSIS — M50021 Cervical disc disorder at C4-C5 level with myelopathy: Secondary | ICD-10-CM | POA: Diagnosis not present

## 2015-04-18 DIAGNOSIS — M5136 Other intervertebral disc degeneration, lumbar region: Secondary | ICD-10-CM | POA: Diagnosis not present

## 2015-04-18 DIAGNOSIS — R051 Acute cough: Secondary | ICD-10-CM | POA: Insufficient documentation

## 2015-04-18 DIAGNOSIS — R05 Cough: Secondary | ICD-10-CM | POA: Insufficient documentation

## 2015-04-18 LAB — RESPIRATORY ALLERGY PROFILE REGION II ~~LOC~~
Allergen, Cedar tree, t12: <0.1 kU/L
Allergen, Comm Silver Birch, t9: <0.1 kU/L
Allergen, Cottonwood, t14: <0.1 kU/L
Allergen, D pternoyssinus,d7: 8.17 kU/L — ABNORMAL HIGH
Allergen, Mouse Urine Protein, e78: <0.1 kU/L
Allergen, Mulberry, t76: <0.1 kU/L
Allergen, Oak,t7: <0.1 kU/L
Alternaria Alternata: <0.1 kU/L
Aspergillus fumigatus, m3: <0.1 kU/L
Bermuda Grass: <0.1 kU/L
Box Elder IgE: <0.1 kU/L
Cat Dander: 3.75 kU/L — ABNORMAL HIGH
Cladosporium Herbarum: <0.1 kU/L
Cockroach: <0.1 kU/L
Common Ragweed: <0.1 kU/L
D. farinae: 6.81 kU/L — ABNORMAL HIGH
Dog Dander: 0.14 kU/L — ABNORMAL HIGH
Elm IgE: <0.1 kU/L
IgE (Immunoglobulin E), Serum: 196 kU/L — ABNORMAL HIGH (ref <115)
Johnson Grass: <0.1 kU/L
Pecan/Hickory Tree IgE: <0.1 kU/L
Penicillium Notatum: <0.1 kU/L
Rough Pigweed  IgE: <0.1 kU/L
Sheep Sorrel IgE: <0.1 kU/L
Timothy Grass: <0.1 kU/L

## 2015-04-21 NOTE — Assessment & Plan Note (Signed)
08/25/12 Sleep study: moderate OSA  Desat is significant, mostly hyponeas. -corrected by 13 cm - ONO RA with cpap 01/2014: NORMAL, no desaturation - .04/15/2015 ono on cpap again/ down load requested   Advised that whether she's lying down to nap or to sleep and def when admitted to hosp needs to have the cpap on when sleeping to prevent desats.  If still desats with cpap on asked to call back.

## 2015-04-21 NOTE — Assessment & Plan Note (Signed)
Body mass index is 44.73    Lab Results  Component Value Date   TSH 2.31 04/15/2015     Contributing to gerd tendency/ doe/reviewed the need and the process to achieve and maintain neg calorie balance > defer f/u primary care including intermittently monitoring thyroid status

## 2015-04-21 NOTE — Assessment & Plan Note (Addendum)
-  Spirometry 02/08/2013: FEV1 57% predicted FVC 71% predicted FEV1 to FVC ratio 64% predicted -02/2013: CT Chest : consistent with small airway obstruction -[facility] 02/2013: normal, no pulm HTN -04/15/2015  Walked RA x 2 laps @ 185 ft each stopped due to back pain > sob/ nl pace, no desats   Small airways are the "silent airways" as in the absence of decrease in FEV1/FVC they usually don't cause much dynamic hyperinflation and clearly her reported Symptoms are markedly disproportionate to objective findings and not clear this is a lung problem but pt does appear to have difficult airway management issues.  DDX of  difficult airways management almost all start with A and  include Adherence, Ace Inhibitors, Acid Reflux, Active Sinus Disease, Alpha 1 Antitripsin deficiency, Anxiety masquerading as Airways dz,  ABPA,  Allergy(esp in young), Aspiration (esp in elderly), Adverse effects of meds,  Active smokers, A bunch of PE's (a small clot burden can't cause this syndrome unless there is already severe underlying pulm or vascular dz with poor reserve) plus two Bs  = Bronchiectasis and Beta blocker use..and one C= CHF  Adherence is always the initial "prime suspect" and is a multilayered concern that requires a "trust but verify" approach in every patient - starting with knowing how to use medications, especially inhalers, correctly, keeping up with refills and understanding the fundamental difference between maintenance and prns vs those medications only taken for a very short course and then stopped and not refilled.  - note on 3 pages of meds/ not clear how she takes that many / seek to simplify if possibl e  ? Acid (or non-acid) GERD > always difficult to exclude as up to 75% of pts in some series report no assoc GI/ Heartburn symptoms> rec max (24h)  acid suppression and diet restrictions/ reviewed and instructions given in writing.   ? Anxiety/ deconditioning/depression re chronic back pain all may be playing  a role here but usually dx of exclusion - note already on psychotropic rx  > Follow up per Primary Care planned      ? Allergy > Allergy profile 04/14/14   Eos 0.3/ IgE 196 with Dust> Cat > Dog so avoidance key   ? chf > excluded with bnp << 100  I had an extended discussion with the patient reviewing all relevant studies completed to date and  lasting 25 minutes of a 40 minute transition of care visit    Each maintenance medication was reviewed in detail including most importantly the difference between maintenance and prns and under what circumstances the prns are to be triggered using an action plan format that is not reflected in the computer generated alphabetically organized AVS.    Please see instructions for details which were reviewed in writing and the patient given a copy highlighting the part that I personally wrote and discussed at today's ov.

## 2015-04-22 DIAGNOSIS — M50322 Other cervical disc degeneration at C5-C6 level: Secondary | ICD-10-CM | POA: Diagnosis not present

## 2015-04-22 DIAGNOSIS — M5033 Other cervical disc degeneration, cervicothoracic region: Secondary | ICD-10-CM | POA: Diagnosis not present

## 2015-04-22 DIAGNOSIS — M5136 Other intervertebral disc degeneration, lumbar region: Secondary | ICD-10-CM | POA: Diagnosis not present

## 2015-04-22 DIAGNOSIS — M4682 Other specified inflammatory spondylopathies, cervical region: Secondary | ICD-10-CM | POA: Diagnosis not present

## 2015-04-22 DIAGNOSIS — M7061 Trochanteric bursitis, right hip: Secondary | ICD-10-CM | POA: Diagnosis not present

## 2015-04-22 DIAGNOSIS — M50323 Other cervical disc degeneration at C6-C7 level: Secondary | ICD-10-CM | POA: Diagnosis not present

## 2015-05-07 DIAGNOSIS — M5136 Other intervertebral disc degeneration, lumbar region: Secondary | ICD-10-CM | POA: Diagnosis not present

## 2015-05-09 DIAGNOSIS — J449 Chronic obstructive pulmonary disease, unspecified: Secondary | ICD-10-CM | POA: Diagnosis not present

## 2015-05-13 ENCOUNTER — Other Ambulatory Visit: Payer: Self-pay | Admitting: Surgery

## 2015-05-13 ENCOUNTER — Telehealth: Payer: Self-pay | Admitting: Internal Medicine

## 2015-05-13 DIAGNOSIS — Z01818 Encounter for other preprocedural examination: Secondary | ICD-10-CM | POA: Diagnosis not present

## 2015-05-13 DIAGNOSIS — K430 Incisional hernia with obstruction, without gangrene: Secondary | ICD-10-CM | POA: Diagnosis not present

## 2015-05-13 NOTE — Telephone Encounter (Signed)
CPAP DL reviewed  Per MW- CPAP is working, but not being compliant enough, need to use more or ins will not pay. Needs d/u with Sleep Doc. Spoke with pt and notified of results per Dr. Melvyn Novas. Pt verbalized understanding and denied any questions. OV with AD was scheduled and DL results placed in scan folder

## 2015-05-13 NOTE — H&P (Signed)
Kathleen Hatfield. Salsgiver 05/13/2015 2:19 PM Location: [facility] Patient #: [id number] DOB: March 05, 1956 Single / Language: Cleophus Molt / Race: White Female  History of Present Illness Adin Hector MD; 05/13/2015 5:41 PM) Patient words: hernia.  The patient is a 60 year old female who presents with an incisional hernia. Note for "Incisional hernia": Patient sent from [place] emergency Department for consultation. Dr. Leonard Schwartz. Concern for symptomatic ventral incisional hernias  Pleasant morbidly obese female. History of myomectomies and hysterectomies in the past. Dr Cletis Media. 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 Noted on colonoscopy. Was massively distended with a lot of pain. Required urgent hand-assisted sigmoid colectomy 2010. Had chronic wound drainage periumbilically on her low midline extraction HandPort site. Eventually closed. Came back 6 months later with some recurrent abdominal pain. Offered CT scan to rule out hernia or obstruction. If that was negative and possible injection nerve blocks for ossicle skeletal pain. Possible need for hernia repair if evidence. She never came back.   Patient notes pain seemed to have gone away years ago. Then began to have recurrent lower abdominal pain. Intensified. Concerned her. Went to the emergency room last month. Did not seem classic for diverticulitis. CT scan showed no evidence of diverticulitis. However patient had periumbilical and suprapubic incisional hernias. Dome of bladder up in the suprapubic region. No evidence of obstruction nor infection. Surgical consultation requested. Patient has less pain now. Usually has a bowel movement about every day. Last colonoscopy in 2012. She does have reactive airway disease and obstructive sleep apnea. Followed by [place] [facility]. Seems to be stable on inhalers. Walkabout 15 minutes before she has to stop. She has not had any more attacks of  diverticulitis that she knows of. Moving her bowels every day. Has a lot of musculoskeletal and back pain issues. That is relatively stable. She's never smoked. She has mild urinary incontinence to coughing or sneezing. Not severe. No urinary tract infections.   Other Problems Marjean Donna, CMA; 05/13/2015 2:19 PM) Anxiety Disorder Asthma Back Pain Bladder Problems Chest pain Chronic Obstructive Lung Disease Congestive Heart Failure Diabetes Mellitus Diverticulosis Gastroesophageal Reflux Disease High blood pressure Hypercholesterolemia Oophorectomy Bilateral. Sleep Apnea Transfusion history Ulcerative Colitis  Past Surgical History Marjean Donna, CMA; 05/13/2015 2:19 PM) Appendectomy Breast Biopsy Bilateral. Cesarean Section - 1 Colon Polyp Removal - Colonoscopy Colon Removal - Partial Gallbladder Surgery - Laparoscopic Hysterectomy (due to cancer) - Complete Resection of Small Bowel Tonsillectomy  Diagnostic Studies History Marjean Donna, CMA; 05/13/2015 2:19 PM) Colonoscopy 1-5 years ago Mammogram >3 years ago Pap Smear 1-5 years ago  Allergies Marjean Donna, CMA; 05/13/2015 2:21 PM) Ceclor *CEPHALOSPORINS* Levaquin *FLUOROQUINOLONES* Sulfa Antibiotics Almond Oil (Sweet) *CHEMICALS*  Medication History (Sonya Bynum, CMA; 05/13/2015 2:22 PM) LORazepam (1MG Tablet, Oral) Active. Anoro Ellipta (62.5-25MCG/INH Aero Pow Br Act, Inhalation) Active. MetFORMIN HCl ER (750MG Tablet ER 24HR, Oral) Active. Losartan Potassium (50MG Tablet, Oral) Active. Gabapentin (100MG Capsule, Oral) Active. Fenofibrate (145MG Tablet, Oral) Active. Estradiol (2MG Tablet, Oral) Active. DULoxetine HCl (60MG Capsule DR Part, Oral) Active. Oxycodone-Acetaminophen (5-325MG Tablet, Oral as needed) Active. Pantoprazole Sodium (40MG Tablet DR, Oral) Active. ROPINIRole HCl (4MG Tablet, Oral) Active. Torsemide (20MG Tablet, Oral) Active. Victoza (18MG/3ML  Soln Pen-inj, Subcutaneous) Active. Potassium Chloride Crys ER (10MEQ Tablet ER, Oral) Active. Dicyclomine HCl (20MG Tablet, Oral) Active. Rosuvastatin Calcium (10MG Tablet, Oral) Active. Medications Reconciled  Social History Marjean Donna, CMA; 05/13/2015 2:19 PM) Alcohol use Occasional alcohol use. Caffeine use Carbonated beverages, Tea. No drug use Tobacco  use Never smoker.  Family History Marjean Donna, [place]; 05/13/2015 2:19 PM) Cerebrovascular Accident Mother. Hypertension Brother, Father. Melanoma Father. Respiratory Condition Father.  Pregnancy / Birth History Marjean Donna, [place]; 05/13/2015 2:19 PM) Age of menopause 45-50 Gravida 1 Maternal age 68-30 Para 1     Review of Systems (Bent; 05/13/2015 2:19 PM) General Not Present- Appetite Loss, Chills, Fatigue, Fever, Night Sweats, Weight Gain and Weight Loss. Skin Present- Non-Healing Wounds. Not Present- Change in Wart/Mole, Dryness, Hives, Jaundice, New Lesions, Rash and Ulcer. HEENT Present- Ringing in the Ears, Seasonal Allergies and Wears glasses/contact lenses. Not Present- Earache, Hearing Loss, Hoarseness, Nose Bleed, Oral Ulcers, Sinus Pain, Sore Throat, Visual Disturbances and Yellow Eyes. Respiratory Present- Difficulty Breathing and Wheezing. Not Present- Bloody sputum, Chronic Cough and Snoring. Breast Not Present- Breast Mass, Breast Pain, Nipple Discharge and Skin Changes. Cardiovascular Present- Difficulty Breathing Lying Down, Leg Cramps and Shortness of Breath. Not Present- Chest Pain, Palpitations, Rapid Heart Rate and Swelling of Extremities. Gastrointestinal Present- Change in Bowel Habits and Difficulty Swallowing. Not Present- Abdominal Pain, Bloating, Bloody Stool, Chronic diarrhea, Constipation, Excessive gas, Gets full quickly at meals, Hemorrhoids, Indigestion, Nausea, Rectal Pain and Vomiting. Female Genitourinary Present- Pelvic Pain and Urgency. Not Present- Frequency, Nocturia and  Painful Urination. Musculoskeletal Present- Back Pain. Not Present- Joint Pain, Joint Stiffness, Muscle Pain, Muscle Weakness and Swelling of Extremities. Neurological Present- Tremor and Trouble walking. Not Present- Decreased Memory, Fainting, Headaches, Numbness, Seizures, Tingling and Weakness. Psychiatric Present- Anxiety and Change in Sleep Pattern. Not Present- Bipolar, Depression, Fearful and Frequent crying. Endocrine Present- Heat Intolerance and New Diabetes. Not Present- Cold Intolerance, Excessive Hunger, Hair Changes and Hot flashes. Hematology Not Present- Easy Bruising, Excessive bleeding, Gland problems, HIV and Persistent Infections.  Vitals (Sonya Bynum CMA; 05/13/2015 2:20 PM) 05/13/2015 2:19 PM Weight: 267 lb Height: 65in Body Surface Area: 2.24 m Body Mass Index: 44.43 kg/m  Pulse: 79 (Regular)  BP: 142/82 (Sitting, Left Arm, Standard)      Physical Exam Adin Hector MD; 05/13/2015 3:16 PM)  General Mental Status-Alert. General Appearance-Not in acute distress, Not Sickly. Orientation-Oriented X3. Hydration-Well hydrated. Voice-Normal.  Integumentary Global Assessment Upon inspection and palpation of skin surfaces of the - Axillae: non-tender, no inflammation or ulceration, no drainage. and Distribution of scalp and body hair is normal. General Characteristics Temperature - normal warmth is noted.  Head and Neck Head-normocephalic, atraumatic with no lesions or palpable masses. Face Global Assessment - atraumatic, no absence of expression. Neck Global Assessment - no abnormal movements, no bruit auscultated on the right, no bruit auscultated on the left, no decreased range of motion, non-tender. Trachea-midline. Thyroid Gland Characteristics - non-tender.  Eye Eyeball - Left-Extraocular movements intact, No Nystagmus. Eyeball - Right-Extraocular movements intact, No Nystagmus. Cornea - Left-No Hazy. Cornea - Right-No  Hazy. Sclera/Conjunctiva - Left-No scleral icterus, No Discharge. Sclera/Conjunctiva - Right-No scleral icterus, No Discharge. Pupil - Left-Direct reaction to light normal. Pupil - Right-Direct reaction to light normal.  ENMT Ears Pinna - Left - no drainage observed, no generalized tenderness observed. Right - no drainage observed, no generalized tenderness observed. Nose and Sinuses External Inspection of the Nose - no destructive lesion observed. Inspection of the nares - Left - quiet respiration. Right - quiet respiration. Mouth and Throat Lips - Upper Lip - no fissures observed, no pallor noted. Lower Lip - no fissures observed, no pallor noted. Nasopharynx - no discharge present. Oral Cavity/Oropharynx - Tongue - no dryness observed. Oral Mucosa -  no cyanosis observed. Hypopharynx - no evidence of airway distress observed.  Chest and Lung Exam Inspection Movements - Normal and Symmetrical. Accessory muscles - No use of accessory muscles in breathing. Palpation Palpation of the chest reveals - Non-tender. Auscultation Breath sounds - Normal and Clear.  Cardiovascular Auscultation Rhythm - Regular. Murmurs & Other Heart Sounds - Auscultation of the heart reveals - No Murmurs and No Systolic Clicks.  Abdomen Inspection Inspection of the abdomen reveals - No Visible peristalsis and No Abnormal pulsations. Umbilicus - No Bleeding, No Urine drainage. Palpation/Percussion Palpation and Percussion of the abdomen reveal - Soft, Non Tender, No Rebound tenderness, No Rigidity (guarding) and No Cutaneous hyperesthesia. Note: Morbid obesity but soft. Periumbilical hernias felt reducible. 5 cm region. Very sensitive 4 cm not felt to the RIGHT of midline suprapubic region. Correlates with the CT scan showing knuckle of dome of bladder incarcerated in the suprapubic midline.  Female Genitourinary Sexual Maturity Tanner 5 - Adult hair pattern. Note: No vaginal bleeding nor  discharge  Peripheral Vascular Upper Extremity Inspection - Left - No Cyanotic nailbeds, Not Ischemic. Right - No Cyanotic nailbeds, Not Ischemic.  Neurologic Neurologic evaluation reveals -normal attention span and ability to concentrate, able to name objects and repeat phrases. Appropriate fund of knowledge , normal sensation and normal coordination. Mental Status Affect - not angry, not paranoid. Cranial Nerves-Normal Bilaterally. Gait-Normal.  Neuropsychiatric Mental status exam performed with findings of-able to articulate well with normal speech/language, rate, volume and coherence, thought content normal with ability to perform basic computations and apply abstract reasoning and no evidence of hallucinations, delusions, obsessions or homicidal/suicidal ideation.  Musculoskeletal Global Assessment Spine, Ribs and Pelvis - no instability, subluxation or laxity. Right Upper Extremity - no instability, subluxation or laxity.  Lymphatic Head & Neck  General Head & Neck Lymphatics: Bilateral - Description - No Localized lymphadenopathy. Axillary  General Axillary Region: Bilateral - Description - No Localized lymphadenopathy. Femoral & Inguinal  Generalized Femoral & Inguinal Lymphatics: Left - Description - No Localized lymphadenopathy. Right - Description - No Localized lymphadenopathy.    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 Status post emergent colectomy 2010 prior GYN surgery.  Given the fact she is rather sensitive in the suprapubic region and it is incarcerated with bladder, I recommended surgical ventral hernia repair back in 2011. Apparently lost to follow-up. Now returns with enlarging hernia there and new hernia with incarcerated bladder.  I think she needs  laparoscopic repair. Prepared [place] approach. Needs to have the bladder taken down to get mesh below the pubis to avoid recurrence. Dry intact the bladder back up at least at the dome. She is interested in proceeding. We'll work to coordinate a convenient time.  I did offer her to see a urologist to see if she needed a bladder tack with her urinary incontinence. It did not seem particular bothersome to her, so she wishes to hold off and proceeded with my surgery first.  Current Plans Because of her reactive airway disease, recommended clearance by her pulmonologist. Dr. were just saw her last month. Hopefully just recommendation of continue inhalers and using CPAP. Just want to be sure.  I recommended obtaining preoperative pulmoanry clearance. I am concerned about the health of the patient and the ability to tolerate the operation. Therefore, we will request clearance by pulmonary better assess operative risk &  see if a reevaluation, further workup, etc is needed. Also recommendations on how medications and therapies should be managed/held/restarted after surgery.  ENCOUNTER FOR PRE-OPERATIVE EXAMINATION - [place] (T26.712)  Current Plans You are being scheduled for surgery - Our schedulers will call you.  You should hear from our office's scheduling department within 5 working days about the location, date, and time of surgery. We try to make accommodations for patient's preferences in scheduling surgery, but sometimes the OR schedule or the surgeon's schedule prevents [place] from making those accommodations.  If you have not heard from our office [phone]) in 5 working days, call the office and ask for your surgeon's nurse.  If you have other questions about your diagnosis, plan, or surgery, call the office and ask for your surgeon's nurse.  Written instructions provided The anatomy & physiology of the abdominal wall was discussed. The pathophysiology of hernias was discussed. Natural history  risks without surgery including progeressive enlargement, pain, incarceration, & strangulation was discussed. Contributors to complications such as smoking, obesity, diabetes, prior surgery, etc were discussed.  I feel the risks of no intervention will lead to serious problems that outweigh the operative risks; therefore, I recommended surgery to reduce and repair the hernia. I explained laparoscopic techniques with possible need for an open approach. I noted the probable use of mesh to patch and/or buttress the hernia repair  Risks such as bleeding, infection, abscess, need for further treatment, heart attack, death, and other risks were discussed. I noted a good likelihood this will help address the problem. Goals of post-operative recovery were discussed as well. Possibility that this will not correct all symptoms was explained. I stressed the importance of low-impact activity, aggressive pain control, avoiding constipation, & not pushing through pain to minimize risk of post-operative chronic pain or injury. Possibility of reherniation especially with smoking, obesity, diabetes, immunosuppression, and other health conditions was discussed. We will work to minimize complications.  An educational handout further explaining the pathology & treatment options was given as well. Questions were answered. The patient expresses understanding & wishes to proceed with surgery.  Pt Education - Pamphlet Given - Laparoscopic Hernia Repair: discussed with patient and provided information. Pt Education - [facility] Hernia Post-Op HCI (Dontrelle Mazon): discussed with patient and provided information. Pt Education - [facility] Pain Control (Vedansh Kerstetter  Adin Hector, M.D., F.A.C.S. Gastrointestinal and Minimally Invasive Surgery [facility] [address], [place], [place] [address] [phone] Main / Paging

## 2015-05-19 DIAGNOSIS — E1165 Type 2 diabetes mellitus with hyperglycemia: Secondary | ICD-10-CM | POA: Diagnosis not present

## 2015-05-19 DIAGNOSIS — J181 Lobar pneumonia, unspecified organism: Secondary | ICD-10-CM | POA: Diagnosis not present

## 2015-05-19 DIAGNOSIS — J449 Chronic obstructive pulmonary disease, unspecified: Secondary | ICD-10-CM | POA: Diagnosis not present

## 2015-05-24 ENCOUNTER — Ambulatory Visit: Payer: Medicare HMO | Admitting: Internal Medicine

## 2015-05-24 ENCOUNTER — Encounter (HOSPITAL_COMMUNITY): Payer: Medicare HMO

## 2015-06-07 ENCOUNTER — Encounter: Payer: Self-pay | Admitting: Pulmonary Disease

## 2015-06-07 ENCOUNTER — Ambulatory Visit (INDEPENDENT_AMBULATORY_CARE_PROVIDER_SITE_OTHER): Payer: Medicare HMO | Admitting: Pulmonary Disease

## 2015-06-07 VITALS — BP 148/82 | HR 78 | Ht 65.75 in | Wt 275.0 lb

## 2015-06-07 DIAGNOSIS — G4733 Obstructive sleep apnea (adult) (pediatric): Secondary | ICD-10-CM | POA: Diagnosis not present

## 2015-06-07 DIAGNOSIS — R5383 Other fatigue: Secondary | ICD-10-CM | POA: Insufficient documentation

## 2015-06-07 DIAGNOSIS — J984 Other disorders of lung: Secondary | ICD-10-CM | POA: Diagnosis not present

## 2015-06-07 DIAGNOSIS — R5382 Chronic fatigue, unspecified: Secondary | ICD-10-CM

## 2015-06-07 NOTE — Assessment & Plan Note (Signed)
Patient is here for fatigue. Known to have sleep apnea, moderate. Corrected on CPAP of 13 cm water. Download from December 2016 until March 2017 showed AHI of less than 1 and 61% compliance. Her sleep apnea is corrected. She feels better using CPAP. More energy, less sleepiness. Feels benefit of CPAP. Continue CPAP at current settings of 13 cm water. Fatigue could be related to a lot of things. Please see below

## 2015-06-07 NOTE — Progress Notes (Signed)
Subjective:    Patient ID: Kathleen Hatfield, female    DOB: 01-Oct-1955, 60 y.o.   MRN: [id number]  HPI Pt is being seen by the practice for SOB/ small airways dse and OSA.  ROV (06/07/15) Patient is here for follow-up on her sleep apnea. She feels tried despite using cpap.  Last download from December 2016 until March 2017 showed AHI of 0.5, 61% compliance. Last ABG in the system showed pH of 7.52, CO2 46, PO2 34 (venous blood gas). She sleeps 5-7 hrs/ 24 hrs period. Feels benefit of using cpap. More energy. A lot of stress with mother having dementia and she has health issues. Uses ativan prn. No scheduled AD. Recent cbc showed mild anemia, mildly increased creat. TSH was N.    Review of Systems  Constitutional: Negative.   HENT: Negative.   Eyes: Negative.   Respiratory: Positive for shortness of breath.   Cardiovascular: Negative.   Gastrointestinal: Negative.   Endocrine: Negative.   Genitourinary: Negative.   Musculoskeletal: Negative.   Allergic/Immunologic: Negative.   Neurological: Negative.   Hematological: Negative.   Psychiatric/Behavioral: Negative.   All other systems reviewed and are negative.   Past Medical History  Diagnosis Date  . Depression   . Hypertension   . Chronic diastolic heart failure ([place])   . Dyspnea   . Asthma   . GERD (gastroesophageal reflux disease)   . Hyperlipidemia     cannot tolerate statins  . Ulcerative colitis ([place])     dr Collene Mares  . Diverticulitis      Family History  Problem Relation Age of Onset  . Heart disease Mother   . Hypertension Mother   . Dementia Mother   . Heart disease Father   . COPD Father   . Hypertension Father      Past Surgical History  Procedure Laterality Date  . Abdominal hysterectomy    . Cholecystectomy    . Cesarean section    . Appendectomy    . Sigmoidectomy  2010    diverticular disease  . Right heart catheterization N/A 02/27/2013    Procedure: RIGHT HEART CATH;  Surgeon: Blane Ohara, MD;   Location: [facility] CATH LAB;  Service: Cardiovascular;  Laterality: N/A;  . Right heart catheterization N/A 12/14/2013    Procedure: RIGHT HEART CATH;  Surgeon: Larey Dresser, MD;  Location: [facility] CATH LAB;  Service: Cardiovascular;  Laterality: N/A;    Social History   Social History  . Marital Status: Single    Spouse Name: N/A  . Number of Children: N/A  . Years of Education: N/A   Occupational History  . unemployed    Social History Main Topics  . Smoking status: Never Smoker   . Smokeless tobacco: Not on file  . Alcohol Use: No  . Drug Use: No  . Sexual Activity: Not on file   Other Topics Concern  . Not on file   Social History Narrative     Allergies  Allergen Reactions  . Almond Oil Anaphylaxis  . Ceclor [Cefaclor] Nausea And Vomiting  . Levaquin [Levofloxacin] Other (See Comments)    Body aches  . Sulfa Antibiotics Nausea And Vomiting     Outpatient Prescriptions Prior to Visit  Medication Sig Dispense Refill  . albuterol (PROVENTIL HFA;VENTOLIN HFA) 108 (90 BASE) MCG/ACT inhaler Inhale 2 puffs into the lungs every 4 (four) hours as needed for wheezing. 1 Inhaler 5  . albuterol (PROVENTIL) (2.5 MG/3ML) 0.083% nebulizer solution Take 3 mLs (  2.5 mg total) by nebulization every 2 (two) hours as needed for wheezing. 180 mL 5  . Alum Hydroxide-Mag Carbonate (GAVISCON PO) Take 2 tablets by mouth at bedtime as needed (reflux).     Jearl Klinefelter ELLIPTA 62.5-25 MCG/INH AEPB INHALE 1 PUFF ONCE DAILY 1 each 0  . baclofen (LIORESAL) 5 mg TABS tablet Take 2.5 mg by mouth 2 (two) times daily as needed for muscle spasms.     . chlorpheniramine (CHLOR-TRIMETON) 4 MG tablet Take 4 mg by mouth daily as needed for allergies.    . Cholecalciferol (VITAMIN D-3) 5000 UNITS TABS Take 5,000 Units by mouth daily.     . DULoxetine (CYMBALTA) 60 MG capsule Take 60 mg by mouth 2 (two) times daily.     Marland Kitchen estradiol (ESTRACE) 2 MG tablet Take 1 tablet (2 mg total) by mouth daily. 90 tablet 3  .  fenofibrate (TRICOR) 145 MG tablet Take 145 mg by mouth at bedtime.   11  . folic acid (FOLVITE) 1 MG tablet Take 1 mg by mouth 2 (two) times daily.    Marland Kitchen gabapentin (NEURONTIN) 100 MG capsule Take 300 mg by mouth at bedtime.     . Linaclotide (LINZESS) 145 MCG CAPS Take 145 mcg by mouth daily as needed (constipation).     . Liraglutide (VICTOZA) 18 MG/3ML SOPN Inject 1.8 mg into the skin every evening.    Marland Kitchen LORazepam (ATIVAN) 1 MG tablet Take 1 tablet by mouth at bedtime.     Marland Kitchen losartan (COZAAR) 50 MG tablet Take 50 mg by mouth daily as needed (SBP >150).     . metFORMIN (GLUCOPHAGE-XR) 750 MG 24 hr tablet Take 750 mg by mouth daily.     Marland Kitchen oxyCODONE-acetaminophen (PERCOCET/ROXICET) 5-325 MG tablet Take 1-2 tablets by mouth every 6 (six) hours as needed for severe pain. 10 tablet 0  . pantoprazole (PROTONIX) 40 MG tablet Take 40 mg by mouth daily.     . potassium chloride (K-DUR,KLOR-CON) 10 MEQ tablet Take 30 mEq by mouth daily.     Marland Kitchen PRESCRIPTION MEDICATION Inhale into the lungs at bedtime. CPAP    . pseudoephedrine (SUDAFED) 120 MG 12 hr tablet Take 120 mg by mouth daily as needed for congestion.    Marland Kitchen rOPINIRole (REQUIP) 4 MG tablet Take 4 mg by mouth 2 (two) times daily.     . rosuvastatin (CRESTOR) 10 MG tablet Take 10 mg by mouth once a week. On Saturday or Sunday    . torsemide (DEMADEX) 20 MG tablet TAKE 3 TABLETS DAILY 90 tablet 0  . traMADol (ULTRAM) 50 MG tablet Take 75-100 mg by mouth daily as needed for severe pain.    Marland Kitchen dicyclomine (BENTYL) 20 MG tablet Take 1 tablet (20 mg total) by mouth 2 (two) times daily. 20 tablet 0  . potassium chloride (KLOR-CON) 20 MEQ packet Take 20-40 mEq by mouth See admin instructions. Mix 1 packet (20 meq) in 16 oz liquid and drink during the day on 1st day, then mix 2 packets (40 meq) in 16 oz liquid and drink during the day on 2nd day, then repeat    . acetaminophen (TYLENOL) 500 MG tablet Take 500 mg by mouth every 4 (four) hours as needed (pain).        No facility-administered medications prior to visit.   No orders of the defined types were placed in this encounter.          Objective:   Physical Exam   Vitals:  Filed Vitals:   06/07/15 1352  BP: 148/82  Pulse: 78  Height: 5' 5.75" (1.67 m)  Weight: 275 lb (124.739 kg)  SpO2: 95%    Constitutional/General:  Pleasant, well-nourished, well-developed, not in any distress,  Comfortably seating.  Well kempt  Body mass index is 44.73 kg/(m^2). Wt Readings from Last 3 Encounters:  06/07/15 275 lb (124.739 kg)  04/15/15 275 lb (124.739 kg)  10/09/14 271 lb (122.925 kg)    HEENT: Pupils equal and reactive to light and accommodation. Anicteric sclerae. Normal nasal mucosa.   No oral  lesions,  mouth clear,  oropharynx clear, no postnasal drip. (-) Oral thrush. No dental caries.  Airway - Mallampati class III  Neck: No masses. Midline trachea. No JVD, (-) LAD. (-) bruits appreciated.  Respiratory/Chest: Grossly normal chest. (-) deformity. (-) Accessory muscle use.  Symmetric expansion. (-) Tenderness on palpation.  Resonant on percussion.  Diminished BS on both lower lung zones. (-) wheezing, rhonchi. Crackles at bases (-) egophony  Cardiovascular: Regular rate and  rhythm, heart sounds normal, no murmur or gallops, no peripheral edema  Gastrointestinal:  Normal bowel sounds. Soft, non-tender. No hepatosplenomegaly.  (-) masses.   Musculoskeletal:  Normal muscle tone. Normal gait.   Extremities: Grossly normal. (-) clubbing, cyanosis.  (-) edema  Skin: (-) rash,lesions seen.   Neurological/Psychiatric : alert, oriented to time, place, person. Normal mood and affect           Assessment & Plan:  OSA (obstructive sleep apnea) Patient is here for fatigue. Known to have sleep apnea, moderate. Corrected on CPAP of 13 cm water. Download from December 2016 until March 2017 showed AHI of less than 1 and 61% compliance. Her sleep apnea is corrected. She  feels better using CPAP. More energy, less sleepiness. Feels benefit of CPAP. Continue CPAP at current settings of 13 cm water. Fatigue could be related to a lot of things. Please see below  Fatigue Patient is here for fatigue. She is compliant with CPAP. CPAP is corrected. Fatigue is multifactorial: Related to sleep apnea. Patient also is sleep deprived sleeping 5-7 hours in a 24-hour period. Patient also has mental health issues with anxiety and depression. Need to rule out obesity hypoventilation syndrome.  Plan : 1. Continue CPAP with current settings. 2. Told patient to touch base with her regular doctor regarding starting antidepressants. She is on Ativan when necessary. Has a lot of stress recently. 3. Try to aim for 7 hours of sleep. 4. Need to rule out obesity hypoventilation syndrome. If she has elevated CO2, we will diagnose her with old age and is and determine whether she will qualify for BiPAP or NIV.   Extreme obesity Weight reduction.   Small airways disease Cont MDIs as ordered by Dr. Melvyn Novas. For PFT   J. Shirl Harris, MD 06/07/2015, 2:40 PM [facility] Pager [phone] After 3 pm or if no answer, call [phone]

## 2015-06-07 NOTE — Assessment & Plan Note (Signed)
Cont MDIs as ordered by Dr. Melvyn Novas. For PFT

## 2015-06-07 NOTE — Assessment & Plan Note (Signed)
Weight reduction 

## 2015-06-07 NOTE — Patient Instructions (Signed)
1. Continue using your CPAP, 13 cm water. Use it every time his sleep. 2. Aim for at least 7 hours of sleep. 3. Talk to your  primary care doctor regarding starting an antidepressant. 4. We will schedule you for an ABG with her breathing test.   Return to clinic -- depending on abg results.

## 2015-06-07 NOTE — Assessment & Plan Note (Signed)
Patient is here for fatigue. She is compliant with CPAP. CPAP is corrected. Fatigue is multifactorial: Related to sleep apnea. Patient also is sleep deprived sleeping 5-7 hours in a 24-hour period. Patient also has mental health issues with anxiety and depression. Need to rule out obesity hypoventilation syndrome.  Plan : 1. Continue CPAP with current settings. 2. Told patient to touch base with her regular doctor regarding starting antidepressants. She is on Ativan when necessary. Has a lot of stress recently. 3. Try to aim for 7 hours of sleep. 4. Need to rule out obesity hypoventilation syndrome. If she has elevated CO2, we will diagnose her with old age and is and determine whether she will qualify for BiPAP or NIV.

## 2015-06-13 DIAGNOSIS — E1165 Type 2 diabetes mellitus with hyperglycemia: Secondary | ICD-10-CM | POA: Diagnosis not present

## 2015-06-19 DIAGNOSIS — R6889 Other general symptoms and signs: Secondary | ICD-10-CM | POA: Diagnosis not present

## 2015-06-19 DIAGNOSIS — E1165 Type 2 diabetes mellitus with hyperglycemia: Secondary | ICD-10-CM | POA: Diagnosis not present

## 2015-06-19 DIAGNOSIS — E1129 Type 2 diabetes mellitus with other diabetic kidney complication: Secondary | ICD-10-CM | POA: Diagnosis not present

## 2015-06-19 DIAGNOSIS — G2581 Restless legs syndrome: Secondary | ICD-10-CM | POA: Diagnosis not present

## 2015-07-02 ENCOUNTER — Ambulatory Visit (INDEPENDENT_AMBULATORY_CARE_PROVIDER_SITE_OTHER): Payer: Medicare HMO | Admitting: Internal Medicine

## 2015-07-02 ENCOUNTER — Ambulatory Visit (HOSPITAL_COMMUNITY)
Admission: RE | Admit: 2015-07-02 | Discharge: 2015-07-02 | Disposition: A | Discharge status: Home / Self Care | Payer: Medicare HMO | Source: Ambulatory Visit | Attending: Internal Medicine | Admitting: Internal Medicine

## 2015-07-02 ENCOUNTER — Encounter: Payer: Self-pay | Admitting: Internal Medicine

## 2015-07-02 ENCOUNTER — Ambulatory Visit: Payer: Medicare HMO | Admitting: Internal Medicine

## 2015-07-02 VITALS — BP 148/78 | HR 89 | Ht 65.0 in | Wt 274.6 lb

## 2015-07-02 DIAGNOSIS — G4733 Obstructive sleep apnea (adult) (pediatric): Secondary | ICD-10-CM | POA: Diagnosis not present

## 2015-07-02 DIAGNOSIS — J4530 Mild persistent asthma, uncomplicated: Secondary | ICD-10-CM | POA: Diagnosis not present

## 2015-07-02 DIAGNOSIS — R0600 Dyspnea, unspecified: Secondary | ICD-10-CM | POA: Diagnosis not present

## 2015-07-02 LAB — BLOOD GAS, ARTERIAL
Acid-Base Excess: 3.3 mmol/L — ABNORMAL HIGH (ref 0.0–2.0)
Bicarbonate: 26.4 mEq/L — ABNORMAL HIGH (ref 20.0–24.0)
Drawn by: 244901
FIO2: 0.21
O2 Saturation: 96.5 %
Patient temperature: 98.6
TCO2: 23.9 mmol/L (ref 0–100)
pCO2 arterial: 35.8 mmHg (ref 35.0–45.0)
pH, Arterial: 7.481 — ABNORMAL HIGH (ref 7.350–7.450)
pO2, Arterial: 86.3 mmHg (ref 80.0–100.0)

## 2015-07-02 LAB — PULMONARY FUNCTION TEST
DL/VA % pred: 99 %
DL/VA: 4.88 ml/min/mmHg/L
DLCO cor % pred: 66 %
DLCO cor: 17.09 ml/min/mmHg
DLCO unc % pred: 60 %
DLCO unc: 15.55 ml/min/mmHg
FEF 25-75 Post: 1.36 L/sec
FEF 25-75 Pre: 0.73 L/sec
FEF2575-%Change-Post: 85 %
FEF2575-%Pred-Post: 56 %
FEF2575-%Pred-Pre: 30 %
FEV1-%Change-Post: 25 %
FEV1-%Pred-Post: 56 %
FEV1-%Pred-Pre: 45 %
FEV1-Post: 1.5 L
FEV1-Pre: 1.2 L
FEV1FVC-%Change-Post: 6 %
FEV1FVC-%Pred-Pre: 83 %
FEV6-%Change-Post: 17 %
FEV6-%Pred-Post: 65 %
FEV6-%Pred-Pre: 55 %
FEV6-Post: 2.17 L
FEV6-Pre: 1.84 L
FEV6FVC-%Change-Post: 0 %
FEV6FVC-%Pred-Post: 103 %
FEV6FVC-%Pred-Pre: 103 %
FVC-%Change-Post: 18 %
FVC-%Pred-Post: 63 %
FVC-%Pred-Pre: 53 %
FVC-Post: 2.18 L
FVC-Pre: 1.85 L
Post FEV1/FVC ratio: 69 %
Post FEV6/FVC ratio: 99 %
Pre FEV1/FVC ratio: 65 %
Pre FEV6/FVC Ratio: 100 %
RV % pred: 133 %
RV: 2.73 L
TLC % pred: 96 %
TLC: 4.99 L

## 2015-07-02 MED ORDER — BUDESONIDE-FORMOTEROL FUMARATE 160-4.5 MCG/ACT IN AERO: INHALATION_SPRAY | RESPIRATORY_TRACT | Status: DC

## 2015-07-02 MED ORDER — ALBUTEROL SULFATE (2.5 MG/3ML) 0.083% IN NEBU
2.5000 mg | INHALATION_SOLUTION | Freq: Once | RESPIRATORY_TRACT | Status: AC
  Administered 2015-07-02: 2.5 mg via RESPIRATORY_TRACT

## 2015-07-02 NOTE — Patient Instructions (Addendum)
Plan A = Automatic = Symbicort 160 Take 2 puffs first thing in am and then another 2 puffs about 12 hours later - call if problems filling it   Plan B = Backup Only use your albuterol as a rescue medication to be used if you can't catch your breath by resting or doing a relaxed purse lip breathing pattern.  - The less you use it, the better it will work when you need it. - Ok to use the inhaler up to 2 puffs  every 4 hours if you must but call for appointment if use goes up over your usual need - Don't leave home without it !!  (think of it like the spare tire for your car)   Plan C = Crisis - only use your albuterol nebulizer if you first try Plan B and it fails to help > ok to use the nebulizer up to every 4 hours but if start needing it regularly call for immediate appointment   Please schedule a follow up office visit in 6 weeks, call sooner if needed to See Dr Larkin Ina - if issues with your drug costs, bring your formulary with you

## 2015-07-02 NOTE — Progress Notes (Signed)
Subjective:   Patient ID: Kathleen Hatfield, female    DOB: 08-16-1955,     MRN: [id number]     Brief patient profile:  76 yowf never smoker previously followed by Dr Joya Gaskins for "small airways dz" s/p remote eval by Gerilyn Nestle in  1980s > could not tol shots     History of Present Illness  10/09/2014 Dr Bettina Gavia last ov  Chief Complaint  Patient presents with  . Follow-up    SOB, chest tightness, cramp in chest from breathing hard.  Hot weather causing symptoms to get worse.    Pt feels weak today only.  No passing out spells.  Stays out of the heat.  Notes chest tightness and is worse.  No real mucus. Cramping in chest . Notes nasal congestion  Pt notes  fluctuation in symptoms with  Weather esp the heat recently  Under a lot of stress and anxiety   04/15/2015 1st   office visit/ [facility] / transition of care  maint rx = anoro Chief Complaint  Patient presents with  . Follow-up    Former PW pt: Uses CPAP nightly, PRN and with naps. DME [employer]. Pt c/o decreased endurance and increased SOB. Notes low O2 levels when laying down.   on cpap hs but not 02  And did poorly off cpap at admit for abd problems from [facility] parking lot-=  From  [facility] to HT stops when arrives x 5 min then able to shop p several min rest Takes ppi but not ac/ on proair on avg once a daily not sure it helps / never at hs/ notes worse with cat litter dust  Not sure anoro helps either. rec Change your pantoprazole to 40 mg Take 30-60 min before first meal of the day  GERD diet  Only use your albuterol as a rescue medication    .   07/02/2015  f/u ov/[facility] re:  Chronic asthma / morbid obesity on multiple forms of saba  Chief Complaint  Patient presents with  . Follow-up    PFT results. no new concerns  can't do a whole aisle at HT s leaning on buggy / not much coughing at all  Sleeping ok on cpap Using saba up to 4 x daily some improvement for a few hours in sob esp p neb  / no excess/ purulent sputum or mucus plugs      No obvious day to day or daytime variability or assoc excess/ purulent sputum or mucus plugs or hemoptysis or cp or chest tightness, subjective wheeze or overt sinus or hb symptoms. No unusual exp hx or h/o childhood pna/ asthma or knowledge of premature birth.  Sleeping ok without nocturnal  or early am exacerbation  of respiratory  c/o's or need for noct saba. Also denies any obvious fluctuation of symptoms with weather or environmental changes or other aggravating or alleviating factors except as outlined above   Current Medications, Allergies, Complete Past Medical History, Past Surgical History, Family History, and Social History were reviewed in [employer] record.  ROS  The following are not active complaints unless bolded sore throat, dysphagia, dental problems, itching, sneezing,  nasal congestion or excess/ purulent secretions, ear ache,   fever, chills, sweats, unintended wt loss, classically pleuritic or exertional cp,  orthopnea pnd or leg swelling, presyncope, palpitations, abdominal pain, anorexia, nausea, vomiting, diarrhea  or change in bowel or bladder habits, change in stools or urine, dysuria,hematuria,  rash, arthralgias, visual complaints, headache, numbness,  weakness or ataxia or problems with walking or coordination,  change in mood/affect or memory.                 Objective:   Physical Exam   amb obese wf/ very evasive with responses, slt slowed mentation  07/02/2015        269    04/15/15 275 lb (124.739 kg)  10/09/14 271 lb (122.925 kg)  05/11/14 277 lb 6.4 oz (125.828 kg)    Vital signs reviewed    Gen: Pleasant, well-nourished, in no distress,  normal affect  ENT: No lesions,  mouth clear,  oropharynx clear, no postnasal drip  Neck: No JVD, no TMG, no carotid bruits  Lungs: No use of accessory muscles, no dullness to percussion, distant BS, poor airflow  Cardiovascular: RRR, heart sounds normal, no murmur or gallops, no  peripheral edema  Abdomen: soft and NT, no HSM,  BS normal  Musculoskeletal: No deformities, no cyanosis or clubbing  Neuro: alert, non focal  Skin: Warm, no lesions or rashes     CXR PA and Lateral:   04/15/2015 :    I personally reviewed images and agree with radiology impression as follows:    Lungs are mildly hyperexpanded. There are areas of reticular scarring in the apices and left upper lobe lingula. There is no evidence of pneumonia or pulmonary edema. No pleural effusion or Pneumothorax          Labs  reviewed:      Chemistry      Component Value Date/Time   NA 139 04/14/2015 1256   K 4.0 04/14/2015 1256   CL 99* 04/14/2015 1256   CO2 29 04/14/2015 1256   BUN 14 04/14/2015 1256   CREATININE 1.22* 04/14/2015 1256      Component Value Date/Time   CALCIUM 9.8 04/14/2015 1256   ALKPHOS 53 04/14/2015 1256   AST 38 04/14/2015 1256   ALT 27 04/14/2015 1256   BILITOT 0.5 04/14/2015 1256          Labs ordered/ reviewed 07/02/2015  ABG    Component Value Date/Time   PHART 7.481* 07/02/2015 1327   PCO2ART 35.8 07/02/2015 1327   PO2ART 86.3 07/02/2015 1327   HCO3 26.4* 07/02/2015 1327   TCO2 23.9 07/02/2015 1327   O2SAT 96.5 07/02/2015 1327                 Assessment & Plan:

## 2015-07-03 NOTE — Assessment & Plan Note (Signed)
Complicated by ohs with ERV 15% by pfts 07/02/15  Body mass index is 45.7   Lab Results  Component Value Date   TSH 2.31 04/15/2015     Contributing to gerd tendency/ doe/reviewed the need and the process to achieve and maintain neg calorie balance > defer f/u primary care including intermittently monitoring thyroid status

## 2015-07-03 NOTE — Assessment & Plan Note (Addendum)
07/12/2012 PFTs: FEV1  1.44 (54%) ratio 66 p 21% improvement from saba and   DLCO 114%   Raw 268% Spirometry 02/08/2013: FEV1 57% predicted FVC 71% predicted FEV1 to FVC ratio 64% predicted 02/2013: CT Chest : consistent with small airway obstruction RHC 02/2013: normal, no pulm HTN - Allergy profile 04/14/14   Eos 0.3/ IgE 196 with Dust> Cat > Dog  - PFT's  07/02/2015  FEV1 1.50 (56 % ) ratio 69  p 25 % improvement from saba p no rx prior to study with DLCO  60 % corrects to 99 % for alv volume  With ERV 15% and moderate restrictive component c/w obesity  - The proper method of use, as well as anticipated side effects, of a metered-dose inhaler are discussed and demonstrated to the patient. Improved effectiveness after extensive coaching during this visit to a level of approximately 75 % from a baseline of 50 %      I had an extended final summary discussion with the patient reviewing all relevant studies completed to date and  lasting 25 minutes of a 240 minute visit on the following issues:  1) this is chronic asthma that looks worse than it really is due to obesity/ restrictive changes but can be corrected back to near nl with saba  2) it is not copd in a never smoker and the cup's definitely half full, not half empty as she can expect a minimum of 25% return on an "investment" of symbicort 160 2bid if she can afford it  3) Formulary restrictions will be an ongoing challenge for the forseable future and I would be happy to pick an alternative if the pt will first  provide me a list of them but pt  will need to return here for training for any new device that is required eg dpi vs hfa vs respimat.    In meantime we can always provide samples so the patient never runs out of any needed respiratory medications.   4) obesity however will be ongoing concern (see separate a/p)   5) since has f/u with Dr DeDios and only mild asthma no reason to see 2 pulmonary docs > f/u in this clinic is prn

## 2015-07-03 NOTE — Assessment & Plan Note (Signed)
08/25/12 Sleep study: moderate OSA  Desat is significant, mostly hyponeas. -corrected by 13 cm - ONO RA with cpap 01/2014: NORMAL, no desaturation - .04/15/2015 rec ono on cpap again  - download thru 05/08/15 compliance 61% cpap 13 >>  AHI 0.5 rec use it more consistently/ f/u sleep medicine  -  ABG    Component Value Date/Time   PHART 7.481* 07/02/2015 1327   PCO2ART 35.8 07/02/2015 1327   PO2ART 86.3 07/02/2015 1327   HCO3 26.4* 07/02/2015 1327   TCO2 23.9 07/02/2015 1327   O2SAT 96.5 07/02/2015 1327    No evidence of OHS > referred back to sleep medicine

## 2015-07-04 DIAGNOSIS — M50323 Other cervical disc degeneration at C6-C7 level: Secondary | ICD-10-CM | POA: Diagnosis not present

## 2015-07-04 DIAGNOSIS — M5136 Other intervertebral disc degeneration, lumbar region: Secondary | ICD-10-CM | POA: Diagnosis not present

## 2015-07-04 DIAGNOSIS — M4682 Other specified inflammatory spondylopathies, cervical region: Secondary | ICD-10-CM | POA: Diagnosis not present

## 2015-07-04 DIAGNOSIS — M50322 Other cervical disc degeneration at C5-C6 level: Secondary | ICD-10-CM | POA: Diagnosis not present

## 2015-07-18 DIAGNOSIS — M533 Sacrococcygeal disorders, not elsewhere classified: Secondary | ICD-10-CM | POA: Diagnosis not present

## 2015-07-24 ENCOUNTER — Telehealth: Payer: Self-pay | Admitting: Internal Medicine

## 2015-07-24 MED ORDER — FLUTICASONE-SALMETEROL 115-21 MCG/ACT IN AERO: 2.0000 | INHALATION_SPRAY | Freq: Two times a day (BID) | RESPIRATORY_TRACT | Status: DC

## 2015-07-24 NOTE — Telephone Encounter (Signed)
Received fax from [facility] not covered, ins prefers breo or advair  Per MW- call in advair hfa 115 2 puffs bid  Needs 1 month f/u  Pt aware, rx sent and appt scheduled

## 2015-07-30 DIAGNOSIS — R69 Illness, unspecified: Secondary | ICD-10-CM | POA: Diagnosis not present

## 2015-07-30 DIAGNOSIS — I739 Peripheral vascular disease, unspecified: Secondary | ICD-10-CM | POA: Diagnosis not present

## 2015-07-30 DIAGNOSIS — L97511 Non-pressure chronic ulcer of other part of right foot limited to breakdown of skin: Secondary | ICD-10-CM | POA: Diagnosis not present

## 2015-08-07 DIAGNOSIS — M533 Sacrococcygeal disorders, not elsewhere classified: Secondary | ICD-10-CM | POA: Diagnosis not present

## 2015-08-26 ENCOUNTER — Encounter: Payer: Self-pay | Admitting: Internal Medicine

## 2015-08-26 ENCOUNTER — Ambulatory Visit (INDEPENDENT_AMBULATORY_CARE_PROVIDER_SITE_OTHER): Payer: Medicare HMO | Admitting: Internal Medicine

## 2015-08-26 VITALS — BP 146/84 | HR 93 | Ht 65.0 in | Wt 263.4 lb

## 2015-08-26 DIAGNOSIS — R69 Illness, unspecified: Secondary | ICD-10-CM | POA: Diagnosis not present

## 2015-08-26 DIAGNOSIS — L97511 Non-pressure chronic ulcer of other part of right foot limited to breakdown of skin: Secondary | ICD-10-CM | POA: Diagnosis not present

## 2015-08-26 DIAGNOSIS — G4733 Obstructive sleep apnea (adult) (pediatric): Secondary | ICD-10-CM | POA: Diagnosis not present

## 2015-08-26 DIAGNOSIS — J4530 Mild persistent asthma, uncomplicated: Secondary | ICD-10-CM

## 2015-08-26 DIAGNOSIS — I739 Peripheral vascular disease, unspecified: Secondary | ICD-10-CM | POA: Diagnosis not present

## 2015-08-26 MED ORDER — ALBUTEROL SULFATE (2.5 MG/3ML) 0.083% IN NEBU: 2.5000 mg | INHALATION_SOLUTION | RESPIRATORY_TRACT | Status: DC | PRN

## 2015-08-26 NOTE — Assessment & Plan Note (Addendum)
Complicated by   ERV 71% by pfts 07/02/15  Body mass index is 43.83 no real change   Lab Results  Component Value Date   TSH 2.31 04/15/2015     Contributing to gerd tendency/ doe/reviewed the need and the process to achieve and maintain neg calorie balance > defer f/u primary care including intermittently monitoring thyroid status

## 2015-08-26 NOTE — Progress Notes (Signed)
Subjective:   Patient ID: Kathleen Hatfield, female    DOB: 05-16-1955,     MRN: [id number]     Brief patient profile:  69 yowf never smoker previously followed by Dr Joya Gaskins for "small airways dz" s/p remote eval by Gerilyn Nestle in  1980s > could not tol shots     History of Present Illness  10/09/2014 Dr Bettina Gavia last ov  Chief Complaint  Patient presents with  . Follow-up    SOB, chest tightness, cramp in chest from breathing hard.  Hot weather causing symptoms to get worse.    Pt feels weak today only.  No passing out spells.  Stays out of the heat.  Notes chest tightness and is worse.  No real mucus. Cramping in chest . Notes nasal congestion  Pt notes  fluctuation in symptoms with  Weather esp the heat recently  Under a lot of stress and anxiety   04/15/2015 1st   office visit/ [name] / transition of care  maint rx = anoro Chief Complaint  Patient presents with  . Follow-up    Former PW pt: Uses CPAP nightly, PRN and with naps. DME [employer]. Pt c/o decreased endurance and increased SOB. Notes low O2 levels when laying down.   on cpap hs but not 02  And did poorly off cpap at admit for abd problems from [facility] Across parking lot-=  From  [facility] to HT stops when arrives x 5 min then able to shop p several min rest Takes ppi but not ac/ on proair on avg once a daily not sure it helps / never at hs/ notes worse with cat litter dust  Not sure anoro helps either. rec Change your pantoprazole to 40 mg Take 30-60 min before first meal of the day  GERD diet  Only use your albuterol as a rescue medication    .   07/02/2015  f/u ov/[name] re:  Chronic asthma / morbid obesity on multiple forms of saba  Chief Complaint  Patient presents with  . Follow-up    PFT results. no new concerns  can't do a whole aisle at HT s leaning on buggy / not much coughing at all  Sleeping ok on cpap Using saba up to 4 x daily some improvement for a few hours in sob esp p neb  / no excess/ purulent sputum or mucus plugs     rec Plan A = Automatic = Symbicort 160 Take 2 puffs first thing in am and then another 2 puffs about 12 hours later - call if problems filling it  Plan B = Backup Only use your albuterol as a rescue medication  Plan C = Crisis - only use your albuterol nebulizer if you first try Plan B and it fails to help > ok to use the nebulizer up to every 4 hours but if start needing it regularly call for immediate appointment    08/26/2015  f/u ov/[name] re: asthma/ obesity/ on advair 115 2bid Chief Complaint  Patient presents with  . Follow-up    Breathing is overall doing well. She feels she did better on symbicort than the advair. She is using rescue inhaler 2 x daily on average.   more slowed by back than breathing/ fine on cpap per Dr DeDios/ never needing neb    No obvious day to day or daytime variability or assoc excess/ purulent sputum or mucus plugs or hemoptysis or cp or chest tightness, subjective wheeze or overt sinus or hb symptoms.  No unusual exp hx or h/o childhood pna/ asthma or knowledge of premature birth.  Sleeping ok without nocturnal  or early am exacerbation  of respiratory  c/o's or need for noct saba. Also denies any obvious fluctuation of symptoms with weather or environmental changes or other aggravating or alleviating factors except as outlined above   Current Medications, Allergies, Complete Past Medical History, Past Surgical History, Family History, and Social History were reviewed in [employer] record.  ROS  The following are not active complaints unless bolded sore throat, dysphagia, dental problems, itching, sneezing,  nasal congestion or excess/ purulent secretions, ear ache,   fever, chills, sweats, unintended wt loss, classically pleuritic or exertional cp,  orthopnea pnd or leg swelling, presyncope, palpitations, abdominal pain, anorexia, nausea, vomiting, diarrhea  or change in bowel or bladder habits, change in stools or urine,  dysuria,hematuria,  rash, arthralgias, visual complaints, headache, numbness, weakness or ataxia or problems with walking or coordination,  change in mood/affect or memory.           Objective:   Physical Exam   amb obese wf nad    08/26/2015        268   07/02/2015        269    04/15/15 275 lb (124.739 kg)  10/09/14 271 lb (122.925 kg)  05/11/14 277 lb 6.4 oz (125.828 kg)    Vital signs reviewed    Gen: Pleasant, well-nourished, in no distress,  normal affect  ENT: No lesions,  mouth clear,  oropharynx clear, no postnasal drip  Neck: No JVD, no TMG, no carotid bruits  Lungs: No use of accessory muscles, no dullness to percussion, distant BS, poor airflow  Cardiovascular: RRR, heart sounds normal, no murmur or gallops, no peripheral edema  Abdomen: very obese, soft and NT, no HSM,  BS normal- limited insp excursion supine  Musculoskeletal: No deformities, no cyanosis or clubbing  Neuro: alert, non focal  Skin: Warm, no lesions or rashes     CXR PA and Lateral:   04/15/2015 :    I personally reviewed images and agree with radiology impression as follows:    Lungs are mildly hyperexpanded. There are areas of reticular scarring in the apices and left upper lobe lingula. There is no evidence of pneumonia or pulmonary edema. No pleural effusion or Pneumothorax                       Assessment & Plan:

## 2015-08-26 NOTE — Assessment & Plan Note (Signed)
07/12/2012 PFTs: FEV1  1.44 (54%) ratio 66 p 21% improvement from saba and   DLCO 114%   Raw 268% Spirometry 02/08/2013: FEV1 57% predicted FVC 71% predicted FEV1 to FVC ratio 64% predicted 02/2013: CT Chest : consistent with small airway obstruction RHC 02/2013: normal, no pulm HTN - Allergy profile 04/14/14   Eos 0.3/ IgE 196 with Dust> Cat > Dog  - PFT's  07/02/2015  FEV1 1.50 (56 % ) ratio 69  p 25 % improvement from saba p no rx prior to study with DLCO  60 % corrects to 99 % for alv volume  With ERV 15% and moderate restrictive component c/w obesity  - 08/26/2015  After extensive coaching HFA effectiveness =   90%     All goals of chronic asthma control met including optimal function (though not completely nl) and elimination of symptoms with acceptable though not ideal  need for rescue therapy(was better on symbicort 160 but insurance insists on advair  Contingencies discussed in full including contacting this office immediately if not controlling the symptoms using the rule of two's.      I had an extended discussion with the patient reviewing all relevant studies completed to date and  lasting 15 to 20 minutes of a 25 minute visit    Discussed in detail all the  indications, usual  risks and alternatives  relative to the benefits with patient who agrees to proceed with back surgery  Each maintenance medication was reviewed in detail including most importantly the difference between maintenance and prns and under what circumstances the prns are to be triggered using an action plan format that is not reflected in the computer generated alphabetically organized AVS.    Please see instructions for details which were reviewed in writing and the patient given a copy highlighting the part that I personally wrote and discussed at today's ov.

## 2015-08-26 NOTE — Assessment & Plan Note (Signed)
08/25/12 Sleep study: moderate OSA  Desat is significant, mostly hyponeas. -corrected by 13 cm - ONO RA with cpap 01/2014: NORMAL, no desaturation - .04/15/2015 rec ono on cpap again  - download thru 05/08/15 compliance 61% cpap 13 >>  AHI 0.5 rec use it more consistently/ f/u sleep medicine > Kathleen Hatfield     Should do fine with low back surgery but will need to take cpap with her and may also need bipap post op as bridge if sedated or if not able to mobilize well/ advised to min narcotics also.

## 2015-08-26 NOTE — Patient Instructions (Addendum)
You are cleared for sugery but take your cpap machine/mask with you  Change symbicort 160 one week before surgery   Work on perfecting inhaler technique:  relax and gently blow all the way out then take a nice smooth deep breath back in, triggering the inhaler at same time you start breathing in.  Hold for up to 5 seconds if you can. Blow out thru nose. Rinse and gargle with water when done   You are cleared for back surgery   All follow up will be through Dr DeDios and we'll see you here as needed

## 2015-08-29 DIAGNOSIS — N3001 Acute cystitis with hematuria: Secondary | ICD-10-CM | POA: Diagnosis not present

## 2015-09-11 DIAGNOSIS — E1129 Type 2 diabetes mellitus with other diabetic kidney complication: Secondary | ICD-10-CM | POA: Diagnosis not present

## 2015-09-15 ENCOUNTER — Encounter: Payer: Self-pay | Admitting: Pulmonary Disease

## 2015-09-16 ENCOUNTER — Encounter: Payer: Self-pay | Admitting: Pulmonary Disease

## 2015-09-16 ENCOUNTER — Ambulatory Visit (INDEPENDENT_AMBULATORY_CARE_PROVIDER_SITE_OTHER): Payer: Medicare HMO | Admitting: Pulmonary Disease

## 2015-09-16 VITALS — BP 128/72 | HR 107 | Ht 65.25 in | Wt 269.0 lb

## 2015-09-16 DIAGNOSIS — G4730 Sleep apnea, unspecified: Secondary | ICD-10-CM | POA: Insufficient documentation

## 2015-09-16 DIAGNOSIS — G4733 Obstructive sleep apnea (adult) (pediatric): Secondary | ICD-10-CM

## 2015-09-16 DIAGNOSIS — J4530 Mild persistent asthma, uncomplicated: Secondary | ICD-10-CM | POA: Diagnosis not present

## 2015-09-16 DIAGNOSIS — Z01818 Encounter for other preprocedural examination: Secondary | ICD-10-CM

## 2015-09-16 DIAGNOSIS — R5382 Chronic fatigue, unspecified: Secondary | ICD-10-CM

## 2015-09-16 DIAGNOSIS — G4710 Hypersomnia, unspecified: Secondary | ICD-10-CM | POA: Diagnosis not present

## 2015-09-16 NOTE — Assessment & Plan Note (Signed)
Weight reduction 

## 2015-09-16 NOTE — Assessment & Plan Note (Addendum)
Patient is here for fatigue. She is compliant with CPAP. CPAP is corrected. Fatigue is multifactorial: Related to sleep apnea. Patient also is sleep deprived sleeping 5-7 hours in a 24-hour period. Patient also has mental health issues with anxiety and depression. Need to rule out obesity hypoventilation syndrome.  Plan : 1. Continue CPAP with current settings. 2. Told patient to touch base with her regular doctor regarding starting antidepressants. She is on Ativan when necessary. Has a lot of stress recently. 3. Try to aim for 7 hours of sleep. 4. Need to rule out obesity hypoventilation syndrome. If she has elevated CO2, we will diagnose her with OHS and determine whether she will qualify for BiPAP or NIV.

## 2015-09-16 NOTE — Progress Notes (Addendum)
Subjective:    Patient ID: Kathleen Hatfield, female    DOB: 12/23/1955, 60 y.o.   MRN: [id number]  HPI Pt is being seen by the practice for SOB/ small airways dse/ asthma  and OSA.  ROV (06/07/15) Patient is here for follow-up on her sleep apnea. She feels tried despite using cpap.  Last download from December 2016 until March 2017 showed AHI of 0.5, 61% compliance. Last ABG in the system showed pH of 7.52, CO2 46, PO2 34 (venous blood gas). She sleeps 5-7 hrs/ 24 hrs period. Feels benefit of using cpap. More energy. A lot of stress with mother having dementia and she has health issues. Uses ativan prn. No scheduled AD. Recent cbc showed mild anemia, mildly increased creat. TSH was N.   ROV (09/16/15)  Pt is here for f/u on her sleepiness and OSA.  Since last seen, she states she continues to be sleepy. She sleeps 10 hrs ina 24 hr period. Feels unrefreshed when she wakes up. Uses cpap. Feels better using it. No issues with it. DL for 3 mos > 91% and AHI of 0.8. She takes gabapentin 400 mg, Ativan 1 mg, Trazodone 50 mg, Reqiup 4 mg BID > all at bedtime. ABG in 06/2015 > 7.48/36/86 on RA. Asthma has been stable; uses advir 2P BID. Not on pred. Not on o2.   Review of Systems  Constitutional: Positive for fatigue.       Hypersomnia, tiredness  HENT: Negative.   Eyes: Negative.   Respiratory: Positive for cough and shortness of breath.   Cardiovascular: Negative.   Gastrointestinal: Negative.   Endocrine: Negative.   Genitourinary: Negative.   Musculoskeletal: Negative.   Allergic/Immunologic: Negative.   Neurological: Negative.   Hematological: Negative.   Psychiatric/Behavioral: Negative.   All other systems reviewed and are negative.     Objective:   Physical Exam   Vitals:  Vitals:   09/16/15 1510  BP: 128/72  Pulse: (!) 107  SpO2: 96%  Weight: 269 lb (122 kg)  Height: 5' 5.25" (1.657 m)    Constitutional/General:  Pleasant, well-nourished, well-developed, not in any distress,   Comfortably seating.  Well kempt  Body mass index is 44.42 kg/m. Wt Readings from Last 3 Encounters:  09/16/15 269 lb (122 kg)  08/26/15 263 lb 6.4 oz (119.5 kg)  07/02/15 274 lb 9.6 oz (124.6 kg)    HEENT: Pupils equal and reactive to light and accommodation. Anicteric sclerae. Normal nasal mucosa.   No oral  lesions,  mouth clear,  oropharynx clear, no postnasal drip. (-) Oral thrush. No dental caries.  Airway - Mallampati class IV  Neck: No masses. Midline trachea. No JVD, (-) LAD. (-) bruits appreciated.  Respiratory/Chest: Grossly normal chest. (-) deformity. (-) Accessory muscle use.  Symmetric expansion. (-) Tenderness on palpation.  Resonant on percussion.  Diminished BS on both lower lung zones. (-) rhonchi. Crackles at bases. Occasional wheeze.  (-) egophony  Cardiovascular: Regular rate and  rhythm, heart sounds normal, no murmur or gallops, Gr 1 edema  Gastrointestinal:  Normal bowel sounds. Soft, non-tender. No hepatosplenomegaly.  (-) masses.   Musculoskeletal:  Normal muscle tone. Normal gait.   Extremities: Grossly normal. (-) clubbing, cyanosis.  Gr 1  edema  Skin: (-) rash,lesions seen.   Neurological/Psychiatric : alert, oriented to time, place, person. Normal mood and affect           Assessment & Plan:  OSA (obstructive sleep apnea) Patient is being seen for hypersomnia and  OSA.  Known to have sleep apnea, moderate. Corrected on CPAP of 13 cm water. Download from December 2016 until March 2017 showed AHI of less than 1 and 61% compliance. DL for June 2017 > 91%, AHI 0.8. Cont CPAP use.  Will address hypersomnia in a different problem.     Mild persistent chronic asthma without complication Pt with chronic asthma.  Asthma seems stable.  02/2013: CT Chest : consistent with small airway obstruction RHC 02/2013: normal, no pulm HTN Allergy profile 04/14/14   Eos 0.3/ IgE 196 with Dust> Cat > Dog  PFT's  07/02/2015  FEV1 1.50 (56 % ) ratio  69  p 25 % improvement from saba p no rx prior to study with DLCO  60 % corrects to 99 % for alv volume  With ERV 15% and moderate restrictive component c/w obesity  Cont advair 2 P BID, cont alb prn.   Severe obesity (BMI >= 40) (HCC) Weight reduction  Fatigue Patient is here for fatigue. She is compliant with CPAP. CPAP is corrected. Fatigue is multifactorial: Related to sleep apnea. Patient also is sleep deprived sleeping 5-7 hours in a 24-hour period. Patient also has mental health issues with anxiety and depression. Need to rule out obesity hypoventilation syndrome.  Plan : 1. Continue CPAP with current settings. 2. Told patient to touch base with her regular doctor regarding starting antidepressants. She is on Ativan when necessary. Has a lot of stress recently. 3. Try to aim for 7 hours of sleep. 4. Need to rule out obesity hypoventilation syndrome. If she has elevated CO2, we will diagnose her with OHS and determine whether she will qualify for BiPAP or NIV.     Hypersomnia with sleep apnea Pt has hypersomnia despite CPAP use. OSA is corrected. Hypersomnia affecting her fxnality.  She gets sleepy driving. Hypersomnia multifactorial: 2/2 OSA, Likely OHS (although abg in 06/2015 was hypocapnea), sedating meds. She takes : Gabapentin, Ativan, Trazodone, Requip. She CAN NOT wean off meds.  Plan : 1. Cont cpap use.  2. Check ABG again.  If with hypercapnea, plan to get NIV. If ABG is within N, will need to order Modafinil 200 mg/d. Not sure if insurance will cover.   Morbid obesity ([place]) Weight reduction  Pre-operative clearance This is an addended problem. 10/14/15 Pt is to be scheduled for Laparoscopic Ventral Hernia repair under [facility].  Pt was last seen in July and during that time, her main issue was hypersomnia most likely related to sedating meds and stress/anxiety. Her asthma and OSA were stable.  For OSA, I recommended to continue CPAP.  I suggested repeat ABG to check for  hypersomnia but she has not done this last time I checked. (Her ABG in 06/2015 did NOT show hypersomnia so repeat will likely be (-) for hypersomnia again). I told her to continue with advair for her asthma.   I discussed with pt over the phone re: risks and possible complications with surgery.  I told her that she is at increased pulmonary risk for complications with surgery given her moderate OSA, asthma, and obesity.  She is at moderate pulmonary risk for moderate risk surgery. Pt accepts these risks and want to proceed with surgery.   At this point, I foresee no absolute contraindication to contemplated surgery as long as patient will have a chest Xray 30 days within  surgery and there will be no abnormalities in CXR. Last CXR was April with no acute findings.    I have mentioned  to the patient to bring her cpap with her on the day of surgery.     RTC in 3 mos.      Monica Becton, MD 10/14/2015, 3:53 AM [facility] Pager [phone] After 3 pm or if no answer, call [phone]

## 2015-09-16 NOTE — Patient Instructions (Signed)
  It was a pleasure taking care of you today!  Continue using your CPAP machine.   Please make sure you use your CPAP device everytime you sleep.  We will monitor the usage of your machine per your insurance requirement.  Your insurance company may take the machine from you if you are not using it regularly.   Please clean the mask, tubings, filter, water reservoir with soapy water every week.  Please use distilled water for the water reservoir.   Please call the office or your machine provider (DME company) if you are having issues with the device.   We will get an ABG in AM and will call you.  Return to clinic in 87mo.

## 2015-09-16 NOTE — Assessment & Plan Note (Addendum)
Patient is being seen for hypersomnia and OSA.  Known to have sleep apnea, moderate. Corrected on CPAP of 13 cm water. Download from December 2016 until March 2017 showed AHI of less than 1 and 61% compliance. DL for June 2017 > 91%, AHI 0.8. Cont CPAP use.  Will address hypersomnia in a different problem.

## 2015-09-16 NOTE — Assessment & Plan Note (Addendum)
Pt with chronic asthma.  Asthma seems stable.  02/2013: CT Chest : consistent with small airway obstruction RHC 02/2013: normal, no pulm HTN Allergy profile 04/14/14   Eos 0.3/ IgE 196 with Dust> Cat > Dog  PFT's  07/02/2015  FEV1 1.50 (56 % ) ratio 69  p 25 % improvement from saba p no rx prior to study with DLCO  60 % corrects to 99 % for alv volume  With ERV 15% and moderate restrictive component c/w obesity  Cont advair 2 P BID, cont alb prn.

## 2015-09-16 NOTE — Assessment & Plan Note (Signed)
Pt has hypersomnia despite CPAP use. OSA is corrected. Hypersomnia affecting her fxnality.  She gets sleepy driving. Hypersomnia multifactorial: 2/2 OSA, Likely OHS (although abg in 06/2015 was hypocapnea), sedating meds. She takes : Gabapentin, Ativan, Trazodone, Requip. She CAN NOT wean off meds.  Plan : 1. Cont cpap use.  2. Check ABG again.  If with hypercapnea, plan to get NIV. If ABG is within N, will need to order Modafinil 200 mg/d. Not sure if insurance will cover.

## 2015-09-17 ENCOUNTER — Telehealth: Payer: Self-pay | Admitting: Pulmonary Disease

## 2015-09-17 ENCOUNTER — Encounter (HOSPITAL_COMMUNITY): Payer: Medicare HMO

## 2015-09-17 ENCOUNTER — Ambulatory Visit (HOSPITAL_COMMUNITY)
Admission: RE | Admit: 2015-09-17 | Discharge: 2015-09-17 | Disposition: A | Discharge status: Home / Self Care | Payer: Medicare HMO | Source: Ambulatory Visit | Attending: Pulmonary Disease | Admitting: Pulmonary Disease

## 2015-09-17 DIAGNOSIS — J4530 Mild persistent asthma, uncomplicated: Secondary | ICD-10-CM

## 2015-09-17 NOTE — Telephone Encounter (Signed)
DISREGARD!! Patient was wanting to reschedule her appt at the hospital for today. i sent her over to that department to reschedule

## 2015-09-17 NOTE — Progress Notes (Signed)
Pt came in for an outpatient ABG. She was originally scheduled for 10:00 this morning, but called to reschedule to 2:00 due to diarrhea. When pt arrived she went to the bathroom & was throwing up according to admissions people. I told her I didn't think we should attempt the ABG, but she said she could handle it. When I attempted to draw the ABG she began throwing up again. I called to inform Dr Corrie Dandy & he said ok just hold off on it for now.  Kathie Dike RRT

## 2015-09-18 DIAGNOSIS — E1165 Type 2 diabetes mellitus with hyperglycemia: Secondary | ICD-10-CM | POA: Diagnosis not present

## 2015-09-18 DIAGNOSIS — E1129 Type 2 diabetes mellitus with other diabetic kidney complication: Secondary | ICD-10-CM | POA: Diagnosis not present

## 2015-09-20 ENCOUNTER — Ambulatory Visit: Payer: Self-pay | Admitting: Physician Assistant

## 2015-09-23 ENCOUNTER — Telehealth: Payer: Self-pay | Admitting: Pulmonary Disease

## 2015-09-23 NOTE — Telephone Encounter (Signed)
lmomtcb x1 

## 2015-09-24 ENCOUNTER — Telehealth: Payer: Self-pay | Admitting: Internal Medicine

## 2015-09-24 DIAGNOSIS — R635 Abnormal weight gain: Secondary | ICD-10-CM | POA: Diagnosis not present

## 2015-09-24 DIAGNOSIS — Z1211 Encounter for screening for malignant neoplasm of colon: Secondary | ICD-10-CM | POA: Diagnosis not present

## 2015-09-24 DIAGNOSIS — K5730 Diverticulosis of large intestine without perforation or abscess without bleeding: Secondary | ICD-10-CM | POA: Diagnosis not present

## 2015-09-24 NOTE — Telephone Encounter (Addendum)
Called and spoke with Elmo Putt at Dr. Johney Maine office and she stated that MW cleared the pt to have her back surgery and she will need another surgical clearance to have hernia repair surgery.  Form received from Dr. Johney Maine office and placed on  MW desk to complete surgical clearance for pt.

## 2015-09-24 NOTE — Telephone Encounter (Signed)
Called and spoke with pt and she is aware that she will have to reschedule with the hospital for this lab.

## 2015-09-24 NOTE — Telephone Encounter (Signed)
My notes say not planning any f/u here with her main pulmonary problem with surgery related to obesity and osa so best to let Dr Murlean Iba clear her

## 2015-09-25 ENCOUNTER — Encounter: Payer: Self-pay | Admitting: Cardiovascular Disease

## 2015-09-25 NOTE — Telephone Encounter (Signed)
LMTCB for [employer]

## 2015-09-26 NOTE — Telephone Encounter (Signed)
Dr. Corrie Dandy would you be able to the surgical clearance form for this pt?

## 2015-09-29 NOTE — Telephone Encounter (Signed)
   I should be able to clear pt. Will need to call pt.  AD

## 2015-09-30 NOTE — Telephone Encounter (Signed)
   Kathleen Hatfield ---  I spoke with the pt. She will try to get an ABG this week or next week. Can you pls touch base with Dr. Johney Maine office re: pulm clearance form ?  She is planning to do hernia repair surgery sometime in Oct 2017.  She is also scheduled to have back surgery in aug but Dr. Melvyn Novas has signed off on the pulm clearance form per pt.  I think they had asked Dr. Melvyn Novas to sign off on the clearance form for the hernia surgery but he/they directed it to me. I hope that makes sense.    AD.

## 2015-09-30 NOTE — Telephone Encounter (Signed)
LVM for pt to return call

## 2015-10-02 ENCOUNTER — Other Ambulatory Visit: Payer: Self-pay | Admitting: Internal Medicine

## 2015-10-02 NOTE — Telephone Encounter (Signed)
LMTCB for Dr Clyda Greener office

## 2015-10-02 NOTE — Telephone Encounter (Signed)
Kathleen Hatfield with Dr. Johney Maine' office returned call, CB is [phone] opt. 6

## 2015-10-02 NOTE — Telephone Encounter (Signed)
LMTCB for Star @ [facility]

## 2015-10-07 NOTE — Telephone Encounter (Signed)
I can. I think Kathleen Hatfield has the form I need to fill out when I am at the office on Thursday.   AD

## 2015-10-07 NOTE — Telephone Encounter (Signed)
Elmo Putt called back [phone] thinks the clearance has went over to [facility] now

## 2015-10-07 NOTE — Telephone Encounter (Signed)
AD can you give surgical clearance for this pt? Thanks!

## 2015-10-07 NOTE — Telephone Encounter (Signed)
Spoke with Kathleen Hatfield at [employer], states that they were trying to just clarify if same surgical clearance recommendations for her back surgery would be the same clearance recommendations for her hernia repair surgery, or if she would need to be seen after her back surgery to be cleared again for her hernia repair.    Will send message first to MW as he gave initial clearance for back sx.  MW please advise.  Thanks!

## 2015-10-09 DIAGNOSIS — K5190 Ulcerative colitis, unspecified, without complications: Secondary | ICD-10-CM | POA: Diagnosis not present

## 2015-10-09 DIAGNOSIS — Z1211 Encounter for screening for malignant neoplasm of colon: Secondary | ICD-10-CM | POA: Diagnosis not present

## 2015-10-09 DIAGNOSIS — K5730 Diverticulosis of large intestine without perforation or abscess without bleeding: Secondary | ICD-10-CM | POA: Diagnosis not present

## 2015-10-11 ENCOUNTER — Telehealth: Payer: Self-pay | Admitting: Pulmonary Disease

## 2015-10-11 DIAGNOSIS — R0600 Dyspnea, unspecified: Secondary | ICD-10-CM

## 2015-10-11 NOTE — Telephone Encounter (Signed)
New ABG order placed. They will call pt to schedule.

## 2015-10-11 NOTE — Telephone Encounter (Signed)
Sheena please advise if this form was completed. Thanks.

## 2015-10-11 NOTE — Telephone Encounter (Signed)
Dr DeDios - were you going to dictate letter for surgical clearance for pt?  Thanks!

## 2015-10-11 NOTE — Telephone Encounter (Signed)
Spoke with pt and advised. Nothing further needed.  

## 2015-10-14 DIAGNOSIS — Z01818 Encounter for other preprocedural examination: Secondary | ICD-10-CM | POA: Insufficient documentation

## 2015-10-14 NOTE — Assessment & Plan Note (Signed)
This is an addended problem. 10/14/15 Pt is to be scheduled for Laparoscopic Ventral Hernia repair under [facility].  Pt was last seen in July and during that time, her main issue was hypersomnia most likely related to sedating meds and stress/anxiety. Her asthma and OSA were stable.  For OSA, I recommended to continue CPAP.  I suggested repeat ABG to check for hypersomnia but she has not done this last time I checked. (Her ABG in 06/2015 did NOT show hypersomnia so repeat will likely be (-) for hypersomnia again). I told her to continue with advair for her asthma.   I discussed with pt over the phone re: risks and possible complications with surgery.  I told her that she is at increased pulmonary risk for complications with surgery given her moderate OSA, asthma, and obesity.  She is at moderate pulmonary risk for moderate risk surgery. Pt accepts these risks and want to proceed with surgery.   At this point, I foresee no absolute contraindication to contemplated surgery as long as patient will have a chest Xray 30 days within  surgery and there will be no abnormalities in CXR. Last CXR was April with no acute findings.    I have mentioned to the patient to bring her cpap with her on the day of surgery.

## 2015-10-14 NOTE — Telephone Encounter (Signed)
Spoke with Abigail Butts @ [facility] and advised of surgical clearance. She asked for OV to be faxed to her. Fax [phone]. Nothing further needed.

## 2015-10-14 NOTE — Telephone Encounter (Signed)
   Sheena : I made an addendum to my July 10th note.  I hope this works. pls let me know if it does not. Thanks.   Monica Becton, MD 10/14/2015, 3:56 AM [facility] Pager [phone] After 3 pm or if no answer, call [phone]

## 2015-10-18 ENCOUNTER — Encounter: Payer: Self-pay | Admitting: Cardiovascular Disease

## 2015-10-21 ENCOUNTER — Encounter (HOSPITAL_COMMUNITY)
Admission: RE | Admit: 2015-10-21 | Discharge: 2015-10-21 | Disposition: A | Discharge status: Home / Self Care | Payer: Medicare HMO | Source: Ambulatory Visit | Attending: Orthopedic Surgery | Admitting: Orthopedic Surgery

## 2015-10-21 ENCOUNTER — Encounter (HOSPITAL_COMMUNITY): Payer: Self-pay

## 2015-10-21 ENCOUNTER — Ambulatory Visit: Payer: Self-pay | Admitting: Surgery

## 2015-10-21 ENCOUNTER — Other Ambulatory Visit (HOSPITAL_COMMUNITY): Payer: Self-pay | Admitting: *Deleted

## 2015-10-21 DIAGNOSIS — G4733 Obstructive sleep apnea (adult) (pediatric): Secondary | ICD-10-CM | POA: Diagnosis not present

## 2015-10-21 DIAGNOSIS — Z01810 Encounter for preprocedural cardiovascular examination: Secondary | ICD-10-CM | POA: Diagnosis not present

## 2015-10-21 DIAGNOSIS — Z01812 Encounter for preprocedural laboratory examination: Secondary | ICD-10-CM | POA: Diagnosis not present

## 2015-10-21 DIAGNOSIS — E669 Obesity, unspecified: Secondary | ICD-10-CM | POA: Diagnosis not present

## 2015-10-21 DIAGNOSIS — J45909 Unspecified asthma, uncomplicated: Secondary | ICD-10-CM | POA: Diagnosis not present

## 2015-10-21 HISTORY — DX: Type 2 diabetes mellitus without complications: E11.9

## 2015-10-21 HISTORY — DX: Anemia, unspecified: D64.9

## 2015-10-21 HISTORY — DX: Other specified postprocedural states: R11.2

## 2015-10-21 HISTORY — DX: Sleep apnea, unspecified: G47.30

## 2015-10-21 HISTORY — DX: Other specified postprocedural states: Z98.890

## 2015-10-21 HISTORY — DX: Unspecified osteoarthritis, unspecified site: M19.90

## 2015-10-21 LAB — SURGICAL PCR SCREEN
MRSA, PCR: POSITIVE — AB
Staphylococcus aureus: POSITIVE — AB

## 2015-10-21 LAB — CBC
HCT: 34.4 % — ABNORMAL LOW (ref 36.0–46.0)
Hemoglobin: 10.8 g/dL — ABNORMAL LOW (ref 12.0–15.0)
MCH: 24.8 pg — ABNORMAL LOW (ref 26.0–34.0)
MCHC: 31.4 g/dL (ref 30.0–36.0)
MCV: 78.9 fL (ref 78.0–100.0)
Platelets: 398 10*3/uL (ref 150–400)
RBC: 4.36 MIL/uL (ref 3.87–5.11)
RDW: 17.5 % — ABNORMAL HIGH (ref 11.5–15.5)
WBC: 11.2 10*3/uL — ABNORMAL HIGH (ref 4.0–10.5)

## 2015-10-21 LAB — BASIC METABOLIC PANEL
Anion gap: 8 (ref 5–15)
BUN: 18 mg/dL (ref 6–20)
CO2: 27 mmol/L (ref 22–32)
Calcium: 9.5 mg/dL (ref 8.9–10.3)
Chloride: 106 mmol/L (ref 101–111)
Creatinine, Ser: 1.22 mg/dL — ABNORMAL HIGH (ref 0.44–1.00)
GFR calc Af Amer: 55 mL/min — ABNORMAL LOW (ref >60)
GFR calc non Af Amer: 47 mL/min — ABNORMAL LOW (ref >60)
Glucose, Bld: 115 mg/dL — ABNORMAL HIGH (ref 65–99)
Potassium: 3.9 mmol/L (ref 3.5–5.1)
Sodium: 141 mmol/L (ref 135–145)

## 2015-10-21 NOTE — Pre-Procedure Instructions (Signed)
GERALDINA PARROTT  10/21/2015      PRIMEMAIL (MAIL ORDER) ELECTRONIC - Shaune Leeks, [place] [address] [place] [address] Phone: [phone] Fax: [phone]  [place], [place] [address] Phone: [phone] Fax: [phone]    Your procedure is scheduled on 10-30-2015   Wednesday    Report to [facility] Admitting at 6:30A.M.   Call this number if you have problems the morning of surgery:  [phone]   Remember:  Do not eat food or drink liquids after midnight.   Take these medicines the morning of surgery with A SIP OF WATER Tylenol if needed.Advair inhaler,baclofen if needed,cymbalta,estradiol,requip,Tramadol,             STOP ASPIRIN,ANTIINFLAMATORIES (IBUPROFEN,ALEVE,MOTRIN,ADVIL,GOODY'S POWDERS),HERBAL SUPPLEMENTS,FISH OIL,AND VITAMINS 5-7 DAYS PRIOR TO SURGERY         How to Manage Your Diabetes Before and After Surgery  Why is it important to control my blood sugar before and after surgery? . Improving blood sugar levels before and after surgery helps healing and can limit problems. . A way of improving blood sugar control is eating a healthy diet by: o  Eating less sugar and carbohydrates o  Increasing activity/exercise o  Talking with your doctor about reaching your blood sugar goals . High blood sugars (greater than 180 mg/dL) can raise your risk of infections and slow your recovery, so you will need to focus on controlling your diabetes during the weeks before surgery. . Make sure that the doctor who takes care of your diabetes knows about your planned surgery including the date and location.  How do I manage my blood sugar before surgery? . Check your blood sugar at least 4 times a day, starting 2 days before surgery, to make sure that the level is not too high or low. o Check your blood sugar the morning of your surgery when  you wake up and every 2 hours until you get to the Short Stay unit. . If your blood sugar is less than 70 mg/dL, you will need to treat for low blood sugar: o Do not take insulin. o Treat a low blood sugar (less than 70 mg/dL) with  cup of clear juice (cranberry or apple), 4 glucose tablets, OR glucose gel. o Recheck blood sugar in 15 minutes after treatment (to make sure it is greater than 70 mg/dL). If your blood sugar is not greater than 70 mg/dL on recheck, call [phone] for further instructions. . Report your blood sugar to the short stay nurse when you get to Short Stay.  . If you are admitted to the hospital after surgery: o Your blood sugar will be checked by the staff and you will probably be given insulin after surgery (instead of oral diabetes medicines) to make sure you have good blood sugar levels. o The goal for blood sugar control after surgery is 80-180 mg/dL.              WHAT DO I DO ABOUT MY DIABETES MEDICATION?   Marland Kitchen Do not take oral diabetes medicines (pills) the morning of surgery.  . THE NIGHT BEFORE SURGERY, take 8 units of Tresiba  insulin.      . The day of surgery, do not take other diabetes injectables, including Byetta (exenatide), Bydureon (exenatide ER), Victoza (liraglutide), or Trulicity (dulaglutide).   Reviewed and Endorsed by [facility] Patient Education Committee, August  2015     Do not wear jewelry, make-up or nail polish.  Do not wear lotions, powders, or perfumes.  You may NOT wear deoderant.  Do not shave 48 hours prior to surgery.     Do not bring valuables to the hospital.   [facility] is not responsible for any belongings or valuables.  Contacts, dentures or bridgework may not be worn into surgery.  Leave your suitcase in the car.  After surgery it may be brought to your room.  For patients admitted to the hospital, discharge time will be determined by your treatment team.  Patients discharged the day of surgery will not  be allowed to drive home.    Special instructions:  See Separate Sheet for instructions on CHG Showers  Please read over the following fact sheets that you were given. MRSA Information and Surgical Site Infection Prevention

## 2015-10-22 ENCOUNTER — Ambulatory Visit (HOSPITAL_COMMUNITY)
Admission: RE | Admit: 2015-10-22 | Discharge: 2015-10-22 | Disposition: A | Discharge status: Home / Self Care | Payer: Medicare HMO | Source: Ambulatory Visit | Attending: Pulmonary Disease | Admitting: Pulmonary Disease

## 2015-10-22 DIAGNOSIS — Z01812 Encounter for preprocedural laboratory examination: Secondary | ICD-10-CM | POA: Diagnosis not present

## 2015-10-22 DIAGNOSIS — Z01810 Encounter for preprocedural cardiovascular examination: Secondary | ICD-10-CM | POA: Insufficient documentation

## 2015-10-22 DIAGNOSIS — E669 Obesity, unspecified: Secondary | ICD-10-CM | POA: Diagnosis not present

## 2015-10-22 DIAGNOSIS — J45909 Unspecified asthma, uncomplicated: Secondary | ICD-10-CM | POA: Insufficient documentation

## 2015-10-22 DIAGNOSIS — G4733 Obstructive sleep apnea (adult) (pediatric): Secondary | ICD-10-CM | POA: Diagnosis not present

## 2015-10-22 DIAGNOSIS — R0600 Dyspnea, unspecified: Secondary | ICD-10-CM

## 2015-10-22 LAB — BLOOD GAS, ARTERIAL
Acid-Base Excess: 3.3 mmol/L — ABNORMAL HIGH (ref 0.0–2.0)
Bicarbonate: 27.3 meq/L — ABNORMAL HIGH (ref 20.0–24.0)
Drawn by: 205171
FIO2: 0.21
O2 Saturation: 95.7 %
Patient temperature: 98.6
TCO2: 25.1 mmol/L (ref 0–100)
pCO2 arterial: 41 mmHg (ref 35.0–45.0)
pH, Arterial: 7.438 (ref 7.350–7.450)
pO2, Arterial: 82.2 mmHg (ref 80.0–100.0)

## 2015-10-22 LAB — HEMOGLOBIN A1C
Hgb A1c MFr Bld: 7.4 % — ABNORMAL HIGH (ref 4.8–5.6)
Mean Plasma Glucose: 166 mg/dL

## 2015-10-24 ENCOUNTER — Telehealth: Payer: Self-pay | Admitting: Pulmonary Disease

## 2015-10-24 NOTE — Telephone Encounter (Signed)
pls tell pt abg was OK. No hypercapnea or hypoxemia. tnx ---  I spoke with patient about results and she verbalized understanding. She wants to know what is next then since she can't get a new CPAP. Please advise thanks

## 2015-10-24 NOTE — Telephone Encounter (Signed)
Spoke to pt re: hypersomnia.   1, OSA is corrected. 2. No OHS. 3. Sedating meds need to be tapered down. She cut down gabapentin. Told her to stop/decrease trazodone dose. Told her she needs a stimulating antidepressant.  4. If not better, I need to see her in the office sooner. She will call.    Monica Becton, MD 10/24/2015, 12:48 PM [facility] Pager [phone] After 3 pm or if no answer, call [phone]

## 2015-10-30 ENCOUNTER — Ambulatory Visit (HOSPITAL_COMMUNITY): Admission: RE | Admit: 2015-10-30 | Payer: Medicare HMO | Source: Ambulatory Visit | Admitting: Orthopedic Surgery

## 2015-10-30 ENCOUNTER — Encounter (HOSPITAL_COMMUNITY): Admission: RE | Payer: Self-pay | Source: Ambulatory Visit

## 2015-10-30 SURGERY — SACROILIAC JOINT FUSION
Anesthesia: General | Laterality: Right

## 2015-11-05 ENCOUNTER — Ambulatory Visit (HOSPITAL_COMMUNITY)
Admission: RE | Admit: 2015-11-05 | Discharge: 2015-11-05 | Disposition: A | Discharge status: Home / Self Care | Payer: Medicare HMO | Source: Ambulatory Visit | Attending: Surgery | Admitting: Surgery

## 2015-11-05 ENCOUNTER — Encounter (HOSPITAL_COMMUNITY)
Admission: RE | Admit: 2015-11-05 | Discharge: 2015-11-05 | Disposition: A | Discharge status: Home / Self Care | Payer: Medicare HMO | Source: Ambulatory Visit | Attending: Surgery | Admitting: Surgery

## 2015-11-05 ENCOUNTER — Encounter (HOSPITAL_COMMUNITY): Payer: Self-pay

## 2015-11-05 DIAGNOSIS — I517 Cardiomegaly: Secondary | ICD-10-CM | POA: Insufficient documentation

## 2015-11-05 DIAGNOSIS — J449 Chronic obstructive pulmonary disease, unspecified: Secondary | ICD-10-CM | POA: Insufficient documentation

## 2015-11-05 DIAGNOSIS — R0602 Shortness of breath: Secondary | ICD-10-CM | POA: Diagnosis not present

## 2015-11-05 HISTORY — DX: Pneumonia, unspecified organism: J18.9

## 2015-11-05 HISTORY — DX: Heart failure, unspecified: I50.9

## 2015-11-05 HISTORY — DX: Polyneuropathy, unspecified: G62.9

## 2015-11-05 HISTORY — DX: Personal history of other medical treatment: Z92.89

## 2015-11-05 LAB — CBC
HCT: 34.7 % — ABNORMAL LOW (ref 36.0–46.0)
Hemoglobin: 10.9 g/dL — ABNORMAL LOW (ref 12.0–15.0)
MCH: 25.2 pg — ABNORMAL LOW (ref 26.0–34.0)
MCHC: 31.4 g/dL (ref 30.0–36.0)
MCV: 80.1 fL (ref 78.0–100.0)
Platelets: 444 10*3/uL — ABNORMAL HIGH (ref 150–400)
RBC: 4.33 MIL/uL (ref 3.87–5.11)
RDW: 18.1 % — ABNORMAL HIGH (ref 11.5–15.5)
WBC: 10.2 10*3/uL (ref 4.0–10.5)

## 2015-11-05 NOTE — Patient Instructions (Addendum)
Kathleen Hatfield  11/05/2015   Your procedure is scheduled on: 11/15/15  Report to [facility] Main  Entrance take [place]  elevators to 3rd floor to  [place] at 0930 AM.  Call this number if you have problems the morning of surgery [phone] Bring CPAP MASK AND TUBING WITH YOU TO HOSPITAL  TAKE ONE HALF DOSAGE Insulin night before surgery,eat snack before midnight  Remember: ONLY 1 PERSON MAY GO WITH YOU TO SHORT STAY TO GET  READY MORNING OF YOUR SURGERY.  Do not eat food or drink liquids :After Midnight.     Take these medicines the morning of surgery with A SIP OF WATER: Cymbalta, Requip, Estrace, Pantoprazole,  Use advair May take Tramadol, Baclofen/ use Albuterol if needed DO NOT TAKE ANY DIABETIC MEDICATIONS DAY OF YOUR SURGERY                               You may not have any metal on your body including hair pins and              piercings  Do not wear jewelry, make-up, lotions, powders or perfumes, deodorant             Do not wear nail polish.  Do not shave  48 hours prior to surgery.              Men may shave face and neck.   Do not bring valuables to the hospital. [place].  Contacts, dentures or bridgework may not be worn into surgery.  Leave suitcase in the car. After surgery it may be brought to your room.           [place] - Preparing for Surgery Before surgery, you can play an important role.  Because skin is not sterile, your skin needs to be as free of germs as possible.  You can reduce the number of germs on your skin by washing with CHG (chlorahexidine gluconate) soap before surgery.  CHG is an antiseptic cleaner which kills germs and bonds with the skin to continue killing germs even after washing. Please DO NOT use if you have an allergy to CHG or antibacterial soaps.  If your skin becomes reddened/irritated stop using the CHG and inform your nurse when you arrive at Short  Stay. Do not shave (including legs and underarms) for at least 48 hours prior to the first CHG shower.  You may shave your face/neck. Please follow these instructions carefully:  1.  Shower with CHG Soap the night before surgery and the  morning of Surgery.  2.  If you choose to wash your hair, wash your hair first as usual with your  normal  shampoo.  3.  After you shampoo, rinse your hair and body thoroughly to remove the  shampoo.                           4.  Use CHG as you would any other liquid soap.  You can apply chg directly  to the skin and wash                       Gently  with a scrungie or clean washcloth.  5.  Apply the CHG Soap to your body ONLY FROM THE NECK DOWN.   Do not use on face/ open                           Wound or open sores. Avoid contact with eyes, ears mouth and genitals (private parts).                       Wash face,  Genitals (private parts) with your normal soap.             6.  Wash thoroughly, paying special attention to the area where your surgery  will be performed.  7.  Thoroughly rinse your body with warm water from the neck down.  8.  DO NOT shower/wash with your normal soap after using and rinsing off  the CHG Soap.                9.  Pat yourself dry with a clean towel.            10.  Wear clean pajamas.            11.  Place clean sheets on your bed the night of your first shower and do not  sleep with pets. Day of Surgery : Do not apply any lotions/deodorants the morning of surgery.  Please wear clean clothes to the hospital/surgery center.  FAILURE TO FOLLOW THESE INSTRUCTIONS MAY RESULT IN THE CANCELLATION OF YOUR SURGERY PATIENT SIGNATURE_________________________________  NURSE SIGNATURE__________________________________  ________________________________________________________________________

## 2015-11-05 NOTE — Progress Notes (Signed)
lov dr Burt Knack 3/16 epic

## 2015-11-05 NOTE — Progress Notes (Signed)
ekg 8/17, hgba1c,cbc, bmp 10/21/15 EPIC Clearance note pulmonary epic, stress eccho 2014 epic

## 2015-11-13 ENCOUNTER — Telehealth: Payer: Self-pay | Admitting: Pulmonary Disease

## 2015-11-13 DIAGNOSIS — G4733 Obstructive sleep apnea (adult) (pediatric): Secondary | ICD-10-CM

## 2015-11-13 NOTE — Telephone Encounter (Signed)
Called and spoke with pt and she is aware of order that has been placed for her to get the cpap supplies.  Nothing further is needed.

## 2015-11-14 MED ORDER — GENTAMICIN SULFATE 40 MG/ML IJ SOLN
INTRAVENOUS | Status: DC
  Filled 2015-11-14: qty 10.5

## 2015-11-15 ENCOUNTER — Ambulatory Visit (HOSPITAL_COMMUNITY): Payer: Medicare HMO | Admitting: Certified Registered Nurse Anesthetist

## 2015-11-15 ENCOUNTER — Encounter (HOSPITAL_COMMUNITY)
Admission: AD | Disposition: A | Discharge status: Home / Self Care | Payer: Self-pay | Source: Ambulatory Visit | Attending: Surgery

## 2015-11-15 ENCOUNTER — Other Ambulatory Visit: Payer: Self-pay | Admitting: Critical Care Medicine

## 2015-11-15 ENCOUNTER — Encounter (HOSPITAL_COMMUNITY): Payer: Self-pay | Admitting: Certified Registered Nurse Anesthetist

## 2015-11-15 ENCOUNTER — Inpatient Hospital Stay (HOSPITAL_COMMUNITY)
Admission: AD | Admit: 2015-11-15 | Discharge: 2015-11-20 | DRG: 354 | Disposition: A | Discharge status: Home / Self Care | Payer: Medicare HMO | Source: Ambulatory Visit | Attending: Surgery | Admitting: Surgery

## 2015-11-15 DIAGNOSIS — J449 Chronic obstructive pulmonary disease, unspecified: Secondary | ICD-10-CM | POA: Diagnosis present

## 2015-11-15 DIAGNOSIS — R0902 Hypoxemia: Secondary | ICD-10-CM | POA: Diagnosis not present

## 2015-11-15 DIAGNOSIS — R339 Retention of urine, unspecified: Secondary | ICD-10-CM | POA: Diagnosis not present

## 2015-11-15 DIAGNOSIS — Z888 Allergy status to other drugs, medicaments and biological substances status: Secondary | ICD-10-CM

## 2015-11-15 DIAGNOSIS — R911 Solitary pulmonary nodule: Secondary | ICD-10-CM | POA: Diagnosis present

## 2015-11-15 DIAGNOSIS — K219 Gastro-esophageal reflux disease without esophagitis: Secondary | ICD-10-CM | POA: Diagnosis not present

## 2015-11-15 DIAGNOSIS — K660 Peritoneal adhesions (postprocedural) (postinfection): Secondary | ICD-10-CM | POA: Diagnosis present

## 2015-11-15 DIAGNOSIS — J9811 Atelectasis: Secondary | ICD-10-CM | POA: Diagnosis not present

## 2015-11-15 DIAGNOSIS — Z86010 Personal history of colonic polyps: Secondary | ICD-10-CM

## 2015-11-15 DIAGNOSIS — Z8249 Family history of ischemic heart disease and other diseases of the circulatory system: Secondary | ICD-10-CM

## 2015-11-15 DIAGNOSIS — I5032 Chronic diastolic (congestive) heart failure: Secondary | ICD-10-CM | POA: Diagnosis not present

## 2015-11-15 DIAGNOSIS — E7800 Pure hypercholesterolemia, unspecified: Secondary | ICD-10-CM | POA: Diagnosis present

## 2015-11-15 DIAGNOSIS — J9601 Acute respiratory failure with hypoxia: Secondary | ICD-10-CM | POA: Diagnosis not present

## 2015-11-15 DIAGNOSIS — K432 Incisional hernia without obstruction or gangrene: Secondary | ICD-10-CM | POA: Diagnosis present

## 2015-11-15 DIAGNOSIS — K430 Incisional hernia with obstruction, without gangrene: Secondary | ICD-10-CM | POA: Diagnosis not present

## 2015-11-15 DIAGNOSIS — E119 Type 2 diabetes mellitus without complications: Secondary | ICD-10-CM | POA: Diagnosis present

## 2015-11-15 DIAGNOSIS — K469 Unspecified abdominal hernia without obstruction or gangrene: Secondary | ICD-10-CM | POA: Diagnosis not present

## 2015-11-15 DIAGNOSIS — J4530 Mild persistent asthma, uncomplicated: Secondary | ICD-10-CM | POA: Diagnosis not present

## 2015-11-15 DIAGNOSIS — I110 Hypertensive heart disease with heart failure: Secondary | ICD-10-CM | POA: Diagnosis present

## 2015-11-15 DIAGNOSIS — F419 Anxiety disorder, unspecified: Secondary | ICD-10-CM | POA: Diagnosis present

## 2015-11-15 DIAGNOSIS — I509 Heart failure, unspecified: Secondary | ICD-10-CM | POA: Diagnosis not present

## 2015-11-15 DIAGNOSIS — Z6841 Body Mass Index (BMI) 40.0 and over, adult: Secondary | ICD-10-CM | POA: Diagnosis not present

## 2015-11-15 DIAGNOSIS — G4733 Obstructive sleep apnea (adult) (pediatric): Secondary | ICD-10-CM | POA: Diagnosis not present

## 2015-11-15 DIAGNOSIS — Z91048 Other nonmedicinal substance allergy status: Secondary | ICD-10-CM

## 2015-11-15 DIAGNOSIS — I10 Essential (primary) hypertension: Secondary | ICD-10-CM | POA: Diagnosis not present

## 2015-11-15 DIAGNOSIS — K5792 Diverticulitis of intestine, part unspecified, without perforation or abscess without bleeding: Secondary | ICD-10-CM | POA: Diagnosis not present

## 2015-11-15 DIAGNOSIS — Z882 Allergy status to sulfonamides status: Secondary | ICD-10-CM

## 2015-11-15 DIAGNOSIS — Z7984 Long term (current) use of oral hypoglycemic drugs: Secondary | ICD-10-CM

## 2015-11-15 HISTORY — PX: LAPAROSCOPIC LYSIS OF ADHESIONS: SHX5905

## 2015-11-15 HISTORY — PX: INSERTION OF MESH: SHX5868

## 2015-11-15 HISTORY — PX: VENTRAL HERNIA REPAIR: SHX424

## 2015-11-15 LAB — GLUCOSE, CAPILLARY
Glucose-Capillary: 122 mg/dL — ABNORMAL HIGH (ref 65–99)
Glucose-Capillary: 163 mg/dL — ABNORMAL HIGH (ref 65–99)
Glucose-Capillary: 193 mg/dL — ABNORMAL HIGH (ref 65–99)

## 2015-11-15 SURGERY — REPAIR, HERNIA, VENTRAL, LAPAROSCOPIC
Anesthesia: General

## 2015-11-15 MED ORDER — PROPOFOL 10 MG/ML IV BOLUS
INTRAVENOUS | Status: AC
  Filled 2015-11-15: qty 20

## 2015-11-15 MED ORDER — ACETAMINOPHEN 500 MG PO TABS
1000.0000 mg | ORAL_TABLET | ORAL | Status: AC
  Administered 2015-11-15: 1000 mg via ORAL
  Filled 2015-11-15: qty 2

## 2015-11-15 MED ORDER — DEXAMETHASONE SODIUM PHOSPHATE 10 MG/ML IJ SOLN
INTRAMUSCULAR | Status: AC
  Filled 2015-11-15: qty 1

## 2015-11-15 MED ORDER — MIDAZOLAM HCL 5 MG/5ML IJ SOLN
INTRAMUSCULAR | Status: DC | PRN
  Administered 2015-11-15: 2 mg via INTRAVENOUS

## 2015-11-15 MED ORDER — POLYETHYLENE GLYCOL 3350 17 G PO PACK
17.0000 g | PACK | Freq: Two times a day (BID) | ORAL | Status: DC | PRN
  Filled 2015-11-15: qty 1

## 2015-11-15 MED ORDER — BUPIVACAINE-EPINEPHRINE 0.5% -1:200000 IJ SOLN
INTRAMUSCULAR | Status: AC
  Filled 2015-11-15: qty 1

## 2015-11-15 MED ORDER — SODIUM CHLORIDE 0.9% FLUSH: 3.0000 mL | INTRAVENOUS | Status: DC | PRN

## 2015-11-15 MED ORDER — ALBUTEROL SULFATE HFA 108 (90 BASE) MCG/ACT IN AERS
INHALATION_SPRAY | RESPIRATORY_TRACT | Status: DC | PRN
  Administered 2015-11-15: 6 via RESPIRATORY_TRACT
  Administered 2015-11-15: 2 via RESPIRATORY_TRACT
  Administered 2015-11-15: 4 via RESPIRATORY_TRACT

## 2015-11-15 MED ORDER — SODIUM CHLORIDE 0.9 % IV SOLN: 250.0000 mL | INTRAVENOUS | Status: DC | PRN

## 2015-11-15 MED ORDER — BUPIVACAINE LIPOSOME 1.3 % IJ SUSP
INTRAMUSCULAR | Status: DC | PRN
  Administered 2015-11-15: 20 mL

## 2015-11-15 MED ORDER — BUPIVACAINE-EPINEPHRINE 0.5% -1:200000 IJ SOLN
INTRAMUSCULAR | Status: DC | PRN
  Administered 2015-11-15: 20 mL

## 2015-11-15 MED ORDER — PHENYLEPHRINE 40 MCG/ML (10ML) SYRINGE FOR IV PUSH (FOR BLOOD PRESSURE SUPPORT)
PREFILLED_SYRINGE | INTRAVENOUS | Status: DC | PRN
  Administered 2015-11-15 (×3): 80 ug via INTRAVENOUS
  Administered 2015-11-15: 120 ug via INTRAVENOUS

## 2015-11-15 MED ORDER — MAGIC MOUTHWASH
15.0000 mL | Freq: Four times a day (QID) | ORAL | Status: DC | PRN
Start: 2015-11-15 — End: 2015-11-20
  Filled 2015-11-15: qty 15

## 2015-11-15 MED ORDER — HYDROMORPHONE HCL 1 MG/ML IJ SOLN
0.5000 mg | INTRAMUSCULAR | Status: DC | PRN
  Administered 2015-11-15 – 2015-11-16 (×2): 1 mg via INTRAVENOUS
  Administered 2015-11-16 – 2015-11-17 (×5): 2 mg via INTRAVENOUS
  Filled 2015-11-15: qty 2
  Filled 2015-11-15: qty 1
  Filled 2015-11-15 (×4): qty 2
  Filled 2015-11-15: qty 1

## 2015-11-15 MED ORDER — PSEUDOEPHEDRINE HCL ER 120 MG PO TB12
120.0000 mg | ORAL_TABLET | Freq: Every day | ORAL | Status: DC | PRN
  Filled 2015-11-15: qty 1

## 2015-11-15 MED ORDER — PROMETHAZINE HCL 25 MG/ML IJ SOLN: 6.2500 mg | INTRAMUSCULAR | Status: DC | PRN

## 2015-11-15 MED ORDER — 0.9 % SODIUM CHLORIDE (POUR BTL) OPTIME
TOPICAL | Status: DC | PRN
  Administered 2015-11-15: 1000 mL

## 2015-11-15 MED ORDER — DEXAMETHASONE SODIUM PHOSPHATE 4 MG/ML IJ SOLN
INTRAMUSCULAR | Status: DC | PRN
  Administered 2015-11-15: 10 mg via INTRAVENOUS

## 2015-11-15 MED ORDER — FOLIC ACID 1 MG PO TABS
1.0000 mg | ORAL_TABLET | Freq: Two times a day (BID) | ORAL | Status: DC
  Administered 2015-11-15 – 2015-11-20 (×10): 1 mg via ORAL
  Filled 2015-11-15 (×10): qty 1

## 2015-11-15 MED ORDER — FENTANYL CITRATE (PF) 100 MCG/2ML IJ SOLN
INTRAMUSCULAR | Status: AC
  Filled 2015-11-15: qty 4

## 2015-11-15 MED ORDER — CHLORHEXIDINE GLUCONATE CLOTH 2 % EX PADS: 6.0000 | MEDICATED_PAD | Freq: Once | CUTANEOUS | Status: DC

## 2015-11-15 MED ORDER — BISACODYL 10 MG RE SUPP: 10.0000 mg | Freq: Two times a day (BID) | RECTAL | Status: DC | PRN

## 2015-11-15 MED ORDER — PHENYLEPHRINE 40 MCG/ML (10ML) SYRINGE FOR IV PUSH (FOR BLOOD PRESSURE SUPPORT)
PREFILLED_SYRINGE | INTRAVENOUS | Status: AC
  Filled 2015-11-15: qty 10

## 2015-11-15 MED ORDER — POLYETHYLENE GLYCOL 3350 17 G PO PACK
17.0000 g | PACK | Freq: Two times a day (BID) | ORAL | Status: DC
  Administered 2015-11-17 – 2015-11-18 (×2): 17 g via ORAL
  Filled 2015-11-15 (×8): qty 1

## 2015-11-15 MED ORDER — CLINDAMYCIN PHOSPHATE 900 MG/50ML IV SOLN
INTRAVENOUS | Status: DC | PRN
  Administered 2015-11-15: 900 mg via INTRAVENOUS

## 2015-11-15 MED ORDER — ALUM & MAG HYDROXIDE-SIMETH 200-200-20 MG/5ML PO SUSP
30.0000 mL | Freq: Four times a day (QID) | ORAL | Status: DC | PRN
  Administered 2015-11-17: 30 mL via ORAL
  Filled 2015-11-15: qty 30

## 2015-11-15 MED ORDER — ENOXAPARIN SODIUM 40 MG/0.4ML ~~LOC~~ SOLN
40.0000 mg | SUBCUTANEOUS | Status: DC
  Administered 2015-11-16 – 2015-11-20 (×5): 40 mg via SUBCUTANEOUS
  Filled 2015-11-15 (×5): qty 0.4

## 2015-11-15 MED ORDER — METOCLOPRAMIDE HCL 5 MG/ML IJ SOLN
INTRAMUSCULAR | Status: AC
Start: 2015-11-15 — End: 2015-11-15
  Filled 2015-11-15: qty 2

## 2015-11-15 MED ORDER — MENTHOL 3 MG MT LOZG: 1.0000 | LOZENGE | OROMUCOSAL | Status: DC | PRN

## 2015-11-15 MED ORDER — VITAMIN D 1000 UNITS PO TABS
5000.0000 [IU] | ORAL_TABLET | Freq: Every day | ORAL | Status: DC
  Administered 2015-11-15 – 2015-11-20 (×6): 5000 [IU] via ORAL
  Filled 2015-11-15 (×7): qty 5

## 2015-11-15 MED ORDER — SUGAMMADEX SODIUM 500 MG/5ML IV SOLN
INTRAVENOUS | Status: AC
  Filled 2015-11-15: qty 5

## 2015-11-15 MED ORDER — SODIUM CHLORIDE 0.9 % IJ SOLN
INTRAMUSCULAR | Status: DC | PRN
  Administered 2015-11-15: 50 mL via INTRAVENOUS

## 2015-11-15 MED ORDER — ACETAMINOPHEN 500 MG PO TABS
1000.0000 mg | ORAL_TABLET | Freq: Three times a day (TID) | ORAL | Status: DC
  Administered 2015-11-15 – 2015-11-20 (×14): 1000 mg via ORAL
  Filled 2015-11-15 (×13): qty 2

## 2015-11-15 MED ORDER — LACTATED RINGERS IV SOLN: INTRAVENOUS | Status: DC

## 2015-11-15 MED ORDER — VITAMIN C 500 MG PO TABS
500.0000 mg | ORAL_TABLET | Freq: Two times a day (BID) | ORAL | Status: DC
  Administered 2015-11-15 – 2015-11-20 (×10): 500 mg via ORAL
  Filled 2015-11-15 (×10): qty 1

## 2015-11-15 MED ORDER — CLINDAMYCIN PHOSPHATE 900 MG/50ML IV SOLN
INTRAVENOUS | Status: AC
  Filled 2015-11-15: qty 50

## 2015-11-15 MED ORDER — HYDROMORPHONE HCL 1 MG/ML IJ SOLN
INTRAMUSCULAR | Status: AC
  Administered 2015-11-15: 0.25 mg via INTRAVENOUS
  Filled 2015-11-15: qty 1

## 2015-11-15 MED ORDER — BUPIVACAINE LIPOSOME 1.3 % IJ SUSP
20.0000 mL | INTRAMUSCULAR | Status: DC
  Filled 2015-11-15: qty 20

## 2015-11-15 MED ORDER — HYDRALAZINE HCL 20 MG/ML IJ SOLN: 10.0000 mg | INTRAMUSCULAR | Status: DC | PRN

## 2015-11-15 MED ORDER — TRAMADOL HCL 50 MG PO TABS
75.0000 mg | ORAL_TABLET | Freq: Four times a day (QID) | ORAL | Status: DC | PRN
  Administered 2015-11-15 – 2015-11-16 (×2): 100 mg via ORAL
  Filled 2015-11-15 (×2): qty 2

## 2015-11-15 MED ORDER — KETAMINE HCL 10 MG/ML IJ SOLN
INTRAMUSCULAR | Status: DC | PRN
  Administered 2015-11-15 (×3): 10 mg via INTRAVENOUS

## 2015-11-15 MED ORDER — HYDROMORPHONE HCL 1 MG/ML IJ SOLN
0.2500 mg | INTRAMUSCULAR | Status: DC | PRN
  Administered 2015-11-15: 0.25 mg via INTRAVENOUS
  Administered 2015-11-15: 0.5 mg via INTRAVENOUS
  Administered 2015-11-15 (×3): 0.25 mg via INTRAVENOUS

## 2015-11-15 MED ORDER — INSULIN ASPART 100 UNIT/ML ~~LOC~~ SOLN: 0.0000 [IU] | Freq: Every day | SUBCUTANEOUS | Status: DC

## 2015-11-15 MED ORDER — TRAZODONE HCL 50 MG PO TABS: 50.0000 mg | ORAL_TABLET | Freq: Every evening | ORAL | Status: DC | PRN

## 2015-11-15 MED ORDER — TORSEMIDE 20 MG PO TABS
60.0000 mg | ORAL_TABLET | Freq: Every day | ORAL | Status: DC
Start: 2015-11-16 — End: 2015-11-20
  Administered 2015-11-16 – 2015-11-20 (×5): 60 mg via ORAL
  Filled 2015-11-15 (×5): qty 3

## 2015-11-15 MED ORDER — GABAPENTIN 300 MG PO CAPS
300.0000 mg | ORAL_CAPSULE | Freq: Every day | ORAL | Status: DC
  Administered 2015-11-15 – 2015-11-19 (×5): 300 mg via ORAL
  Filled 2015-11-15 (×6): qty 1

## 2015-11-15 MED ORDER — PANTOPRAZOLE SODIUM 40 MG PO TBEC
40.0000 mg | DELAYED_RELEASE_TABLET | Freq: Every day | ORAL | Status: DC
Start: 2015-11-16 — End: 2015-11-20
  Administered 2015-11-16 – 2015-11-20 (×5): 40 mg via ORAL
  Filled 2015-11-15 (×5): qty 1

## 2015-11-15 MED ORDER — MIDAZOLAM HCL 2 MG/2ML IJ SOLN
INTRAMUSCULAR | Status: AC
  Filled 2015-11-15: qty 2

## 2015-11-15 MED ORDER — ACETAMINOPHEN 500 MG PO TABS
500.0000 mg | ORAL_TABLET | ORAL | Status: DC | PRN
  Filled 2015-11-15 (×2): qty 1

## 2015-11-15 MED ORDER — ONDANSETRON 4 MG PO TBDP: 4.0000 mg | ORAL_TABLET | Freq: Four times a day (QID) | ORAL | Status: DC | PRN

## 2015-11-15 MED ORDER — INSULIN GLARGINE 100 UNIT/ML ~~LOC~~ SOLN
15.0000 [IU] | Freq: Every day | SUBCUTANEOUS | Status: DC
  Administered 2015-11-15 – 2015-11-19 (×5): 15 [IU] via SUBCUTANEOUS
  Filled 2015-11-15 (×5): qty 0.15

## 2015-11-15 MED ORDER — HYDROMORPHONE HCL 1 MG/ML IJ SOLN
INTRAMUSCULAR | Status: AC
  Administered 2015-11-15: 0.5 mg via INTRAVENOUS
  Filled 2015-11-15: qty 1

## 2015-11-15 MED ORDER — ALBUTEROL SULFATE HFA 108 (90 BASE) MCG/ACT IN AERS
INHALATION_SPRAY | RESPIRATORY_TRACT | Status: AC
  Filled 2015-11-15: qty 6.7

## 2015-11-15 MED ORDER — LACTATED RINGERS IV BOLUS (SEPSIS): 1000.0000 mL | Freq: Three times a day (TID) | INTRAVENOUS | Status: AC | PRN

## 2015-11-15 MED ORDER — ALBUTEROL SULFATE HFA 108 (90 BASE) MCG/ACT IN AERS: 2.0000 | INHALATION_SPRAY | RESPIRATORY_TRACT | Status: DC | PRN

## 2015-11-15 MED ORDER — LIP MEDEX EX OINT
1.0000 "application " | TOPICAL_OINTMENT | Freq: Two times a day (BID) | CUTANEOUS | Status: DC
  Administered 2015-11-15 – 2015-11-19 (×7): 1 via TOPICAL
  Filled 2015-11-15 (×2): qty 7

## 2015-11-15 MED ORDER — ONDANSETRON HCL 4 MG/2ML IJ SOLN
INTRAMUSCULAR | Status: DC | PRN
  Administered 2015-11-15: 4 mg via INTRAVENOUS

## 2015-11-15 MED ORDER — INSULIN ASPART 100 UNIT/ML ~~LOC~~ SOLN
0.0000 [IU] | Freq: Three times a day (TID) | SUBCUTANEOUS | Status: DC
  Administered 2015-11-16 (×3): 2 [IU] via SUBCUTANEOUS
  Administered 2015-11-17: 5 [IU] via SUBCUTANEOUS
  Administered 2015-11-17 – 2015-11-18 (×4): 3 [IU] via SUBCUTANEOUS
  Administered 2015-11-19: 5 [IU] via SUBCUTANEOUS
  Administered 2015-11-19 – 2015-11-20 (×3): 2 [IU] via SUBCUTANEOUS

## 2015-11-15 MED ORDER — ONDANSETRON HCL 4 MG/2ML IJ SOLN
INTRAMUSCULAR | Status: AC
  Filled 2015-11-15: qty 2

## 2015-11-15 MED ORDER — ALBUTEROL SULFATE (2.5 MG/3ML) 0.083% IN NEBU: 2.5000 mg | INHALATION_SOLUTION | RESPIRATORY_TRACT | Status: DC | PRN

## 2015-11-15 MED ORDER — METOCLOPRAMIDE HCL 5 MG/ML IJ SOLN
INTRAMUSCULAR | Status: DC | PRN
  Administered 2015-11-15: 10 mg via INTRAVENOUS

## 2015-11-15 MED ORDER — BACLOFEN 5 MG HALF TABLET
5.0000 mg | ORAL_TABLET | Freq: Two times a day (BID) | ORAL | Status: DC | PRN
  Filled 2015-11-15: qty 1

## 2015-11-15 MED ORDER — LIDOCAINE 2% (20 MG/ML) 5 ML SYRINGE
INTRAMUSCULAR | Status: AC
  Filled 2015-11-15: qty 5

## 2015-11-15 MED ORDER — DULOXETINE HCL 60 MG PO CPEP
60.0000 mg | ORAL_CAPSULE | Freq: Two times a day (BID) | ORAL | Status: DC
  Administered 2015-11-15 – 2015-11-20 (×10): 60 mg via ORAL
  Filled 2015-11-15 (×10): qty 1

## 2015-11-15 MED ORDER — LIRAGLUTIDE 18 MG/3ML ~~LOC~~ SOPN
1.2000 mg | PEN_INJECTOR | Freq: Every evening | SUBCUTANEOUS | Status: DC
  Filled 2015-11-15: qty 3

## 2015-11-15 MED ORDER — DIPHENHYDRAMINE HCL 12.5 MG/5ML PO ELIX: 12.5000 mg | ORAL_SOLUTION | Freq: Four times a day (QID) | ORAL | Status: DC | PRN

## 2015-11-15 MED ORDER — PHENOL 1.4 % MT LIQD: 2.0000 | OROMUCOSAL | Status: DC | PRN

## 2015-11-15 MED ORDER — POTASSIUM CHLORIDE CRYS ER 20 MEQ PO TBCR
30.0000 meq | EXTENDED_RELEASE_TABLET | Freq: Every day | ORAL | Status: DC
  Administered 2015-11-15 – 2015-11-20 (×6): 30 meq via ORAL
  Filled 2015-11-15 (×6): qty 1

## 2015-11-15 MED ORDER — SUGAMMADEX SODIUM 500 MG/5ML IV SOLN
INTRAVENOUS | Status: DC | PRN
  Administered 2015-11-15: 250 mg via INTRAVENOUS

## 2015-11-15 MED ORDER — LUBIPROSTONE 24 MCG PO CAPS
24.0000 ug | ORAL_CAPSULE | Freq: Two times a day (BID) | ORAL | Status: DC | PRN
  Administered 2015-11-16 – 2015-11-18 (×3): 24 ug via ORAL
  Filled 2015-11-15 (×4): qty 1

## 2015-11-15 MED ORDER — LORAZEPAM 1 MG PO TABS
1.0000 mg | ORAL_TABLET | Freq: Every day | ORAL | Status: DC
  Administered 2015-11-15 – 2015-11-17 (×3): 1 mg via ORAL
  Filled 2015-11-15 (×3): qty 1

## 2015-11-15 MED ORDER — FENTANYL CITRATE (PF) 100 MCG/2ML IJ SOLN
INTRAMUSCULAR | Status: DC | PRN
  Administered 2015-11-15 (×4): 50 ug via INTRAVENOUS

## 2015-11-15 MED ORDER — PROPOFOL 10 MG/ML IV BOLUS
INTRAVENOUS | Status: DC | PRN
  Administered 2015-11-15: 200 mg via INTRAVENOUS

## 2015-11-15 MED ORDER — FENOFIBRATE 160 MG PO TABS
160.0000 mg | ORAL_TABLET | Freq: Every day | ORAL | Status: DC
  Administered 2015-11-15 – 2015-11-20 (×6): 160 mg via ORAL
  Filled 2015-11-15 (×7): qty 1

## 2015-11-15 MED ORDER — SIMETHICONE 80 MG PO CHEW
40.0000 mg | CHEWABLE_TABLET | Freq: Four times a day (QID) | ORAL | Status: DC | PRN
  Filled 2015-11-15: qty 1

## 2015-11-15 MED ORDER — SODIUM CHLORIDE 0.9% FLUSH
3.0000 mL | Freq: Two times a day (BID) | INTRAVENOUS | Status: DC
  Administered 2015-11-16 – 2015-11-19 (×5): 3 mL via INTRAVENOUS

## 2015-11-15 MED ORDER — TRAMADOL HCL 50 MG PO TABS: 75.0000 mg | ORAL_TABLET | Freq: Four times a day (QID) | ORAL | 0 refills | Status: DC | PRN

## 2015-11-15 MED ORDER — ROCURONIUM BROMIDE 100 MG/10ML IV SOLN
INTRAVENOUS | Status: AC
  Filled 2015-11-15: qty 1

## 2015-11-15 MED ORDER — ROPINIROLE HCL 1 MG PO TABS
4.0000 mg | ORAL_TABLET | Freq: Two times a day (BID) | ORAL | Status: DC
  Administered 2015-11-15 – 2015-11-20 (×10): 4 mg via ORAL
  Filled 2015-11-15 (×14): qty 4

## 2015-11-15 MED ORDER — METFORMIN HCL ER 500 MG PO TB24
750.0000 mg | ORAL_TABLET | Freq: Every day | ORAL | Status: DC
  Administered 2015-11-17: 750 mg via ORAL
  Filled 2015-11-15: qty 1.5

## 2015-11-15 MED ORDER — METHYLENE BLUE 0.5 % INJ SOLN
INTRAVENOUS | Status: AC
  Filled 2015-11-15: qty 10

## 2015-11-15 MED ORDER — DIPHENHYDRAMINE HCL 50 MG/ML IJ SOLN: 12.5000 mg | Freq: Four times a day (QID) | INTRAMUSCULAR | Status: DC | PRN

## 2015-11-15 MED ORDER — PHENYLEPHRINE HCL 10 MG/ML IJ SOLN
INTRAMUSCULAR | Status: DC | PRN
  Administered 2015-11-15: 50 ug/min via INTRAVENOUS

## 2015-11-15 MED ORDER — CLINDAMYCIN PHOSPHATE 900 MG/50ML IV SOLN
900.0000 mg | Freq: Four times a day (QID) | INTRAVENOUS | Status: AC
Start: 2015-11-15 — End: 2015-11-16
  Administered 2015-11-15 – 2015-11-16 (×2): 900 mg via INTRAVENOUS
  Filled 2015-11-15 (×3): qty 50

## 2015-11-15 MED ORDER — DIPHENHYDRAMINE HCL 50 MG/ML IJ SOLN
INTRAMUSCULAR | Status: DC | PRN
  Administered 2015-11-15: 12.5 mg via INTRAVENOUS

## 2015-11-15 MED ORDER — MUPIROCIN 2 % EX OINT
1.0000 "application " | TOPICAL_OINTMENT | Freq: Two times a day (BID) | CUTANEOUS | Status: DC
  Administered 2015-11-15 – 2015-11-19 (×9): 1 via TOPICAL
  Filled 2015-11-15 (×2): qty 22

## 2015-11-15 MED ORDER — ONDANSETRON HCL 4 MG/2ML IJ SOLN
4.0000 mg | Freq: Four times a day (QID) | INTRAMUSCULAR | Status: DC | PRN
  Administered 2015-11-17 – 2015-11-18 (×2): 4 mg via INTRAVENOUS
  Filled 2015-11-15 (×2): qty 2

## 2015-11-15 MED ORDER — ROSUVASTATIN CALCIUM 20 MG PO TABS
10.0000 mg | ORAL_TABLET | ORAL | Status: DC
  Administered 2015-11-16: 10 mg via ORAL
  Filled 2015-11-15: qty 1

## 2015-11-15 MED ORDER — ROCURONIUM BROMIDE 10 MG/ML (PF) SYRINGE
PREFILLED_SYRINGE | INTRAVENOUS | Status: DC | PRN
  Administered 2015-11-15 (×2): 20 mg via INTRAVENOUS
  Administered 2015-11-15: 50 mg via INTRAVENOUS
  Administered 2015-11-15: 10 mg via INTRAVENOUS

## 2015-11-15 MED ORDER — CELECOXIB 200 MG PO CAPS
400.0000 mg | ORAL_CAPSULE | ORAL | Status: AC
  Administered 2015-11-15: 400 mg via ORAL
  Filled 2015-11-15: qty 2

## 2015-11-15 MED ORDER — GABAPENTIN 300 MG PO CAPS
300.0000 mg | ORAL_CAPSULE | ORAL | Status: AC
  Administered 2015-11-15: 300 mg via ORAL
  Filled 2015-11-15: qty 1

## 2015-11-15 MED ORDER — LIDOCAINE 2% (20 MG/ML) 5 ML SYRINGE
INTRAMUSCULAR | Status: DC | PRN
  Administered 2015-11-15: 50 mg via INTRAVENOUS

## 2015-11-15 MED ORDER — EPHEDRINE 5 MG/ML INJ
INTRAVENOUS | Status: AC
  Filled 2015-11-15: qty 10

## 2015-11-15 MED ORDER — LACTATED RINGERS IV SOLN
INTRAVENOUS | Status: DC | PRN
  Administered 2015-11-15 (×2): via INTRAVENOUS

## 2015-11-15 MED ORDER — PHENYLEPHRINE HCL 10 MG/ML IJ SOLN
INTRAMUSCULAR | Status: AC
Start: 2015-11-15 — End: 2015-11-15
  Filled 2015-11-15: qty 1

## 2015-11-15 MED ORDER — EPHEDRINE SULFATE-NACL 50-0.9 MG/10ML-% IV SOSY
PREFILLED_SYRINGE | INTRAVENOUS | Status: DC | PRN
  Administered 2015-11-15: 10 mg via INTRAVENOUS
  Administered 2015-11-15: 15 mg via INTRAVENOUS
  Administered 2015-11-15 (×3): 5 mg via INTRAVENOUS
  Administered 2015-11-15: 10 mg via INTRAVENOUS

## 2015-11-15 MED ORDER — ESTRADIOL 2 MG PO TABS
2.0000 mg | ORAL_TABLET | Freq: Every day | ORAL | Status: DC
  Administered 2015-11-16 – 2015-11-20 (×5): 2 mg via ORAL
  Filled 2015-11-15 (×5): qty 1

## 2015-11-15 MED ORDER — SODIUM CHLORIDE 0.9 % IJ SOLN
INTRAMUSCULAR | Status: AC
  Filled 2015-11-15: qty 50

## 2015-11-15 SURGICAL SUPPLY — 43 items
APPLIER CLIP 5 13 M/L LIGAMAX5 (MISCELLANEOUS)
APR CLP MED LRG 5 ANG JAW (MISCELLANEOUS)
BINDER ABDOMINAL 12 ML 46-62 (SOFTGOODS) ×1 IMPLANT
CABLE HIGH FREQUENCY MONO STRZ (ELECTRODE) ×2 IMPLANT
CHLORAPREP W/TINT 26ML (MISCELLANEOUS) ×3 IMPLANT
CLIP APPLIE 5 13 M/L LIGAMAX5 (MISCELLANEOUS) IMPLANT
COVER SURGICAL LIGHT HANDLE (MISCELLANEOUS) ×1 IMPLANT
DECANTER SPIKE VIAL GLASS SM (MISCELLANEOUS) ×2 IMPLANT
DEVICE SECURE STRAP 25 ABSORB (INSTRUMENTS) ×1 IMPLANT
DEVICE TROCAR PUNCTURE CLOSURE (ENDOMECHANICALS) ×2 IMPLANT
DRAPE WARM FLUID 44X44 (DRAPE) ×2 IMPLANT
DRSG TEGADERM 2-3/8X2-3/4 SM (GAUZE/BANDAGES/DRESSINGS) ×4 IMPLANT
DRSG TEGADERM 4X4.75 (GAUZE/BANDAGES/DRESSINGS) IMPLANT
ELECT REM PT RETURN 9FT ADLT (ELECTROSURGICAL) ×2
ELECTRODE REM PT RTRN 9FT ADLT (ELECTROSURGICAL) ×1 IMPLANT
GAUZE SPONGE 2X2 8PLY STRL LF (GAUZE/BANDAGES/DRESSINGS) IMPLANT
GLOVE ECLIPSE 8.0 STRL XLNG CF (GLOVE) ×2 IMPLANT
GLOVE INDICATOR 8.0 STRL GRN (GLOVE) ×2 IMPLANT
GOWN STRL REUS W/TWL XL LVL3 (GOWN DISPOSABLE) ×6 IMPLANT
IRRIG SUCT STRYKERFLOW 2 WTIP (MISCELLANEOUS) ×2
IRRIGATION SUCT STRKRFLW 2 WTP (MISCELLANEOUS) IMPLANT
KIT BASIN OR (CUSTOM PROCEDURE TRAY) ×2 IMPLANT
MARKER SKIN DUAL TIP RULER LAB (MISCELLANEOUS) ×2 IMPLANT
MESH VENTRALIGHT ST 10X13IN (Mesh General) ×1 IMPLANT
NDL SPNL 22GX3.5 QUINCKE BK (NEEDLE) IMPLANT
NEEDLE SPNL 22GX3.5 QUINCKE BK (NEEDLE) ×2 IMPLANT
PAD ION LEFT ARM DISP (MISCELLANEOUS) ×1 IMPLANT
PAD ION RIGHT ARM DISP (MISCELLANEOUS) ×1 IMPLANT
PAD POSITIONING PINK XL (MISCELLANEOUS) ×2 IMPLANT
PLUG CATH AND CAP STER (CATHETERS) ×1 IMPLANT
SCISSORS LAP 5X35 DISP (ENDOMECHANICALS) ×2 IMPLANT
SHEARS HARMONIC ACE PLUS 36CM (ENDOMECHANICALS) IMPLANT
SLEEVE XCEL OPT CAN 5 100 (ENDOMECHANICALS) ×4 IMPLANT
SPONGE GAUZE 2X2 STER 10/PKG (GAUZE/BANDAGES/DRESSINGS) ×1
STRIP CLOSURE SKIN 1/2X4 (GAUZE/BANDAGES/DRESSINGS) ×3 IMPLANT
SUT MNCRL AB 4-0 PS2 18 (SUTURE) ×4 IMPLANT
SUT PDS AB 1 CT1 27 (SUTURE) ×5 IMPLANT
SUT PROLENE 1 CT 1 30 (SUTURE) ×15 IMPLANT
TOWEL OR 17X26 10 PK STRL BLUE (TOWEL DISPOSABLE) ×2 IMPLANT
TRAY LAPAROSCOPIC (CUSTOM PROCEDURE TRAY) ×2 IMPLANT
TROCAR BLADELESS OPT 5 100 (ENDOMECHANICALS) ×2 IMPLANT
TROCAR XCEL NON-BLD 11X100MML (ENDOMECHANICALS) IMPLANT
TUBING INSUF HEATED (TUBING) ×2 IMPLANT

## 2015-11-15 NOTE — Op Note (Signed)
11/15/2015  3:40 PM  PATIENT:  Kathleen Hatfield  60 y.o. female  Patient Care Team: Myer Peer, MD as PCP - General (Unknown Physician Specialty) [name], MD as Consulting Physician (Pulmonary Disease) [name], MD as Consulting Physician (General Surgery) [facility], MD as Consulting Physician (Pulmonary Disease)  PRE-OPERATIVE DIAGNOSIS:   Incisional hernia in abdomen  POST-OPERATIVE DIAGNOSIS:  Incisional hernia in abdomen  PROCEDURE:  Procedure(s): LAPAROSCOPIC VENTRAL WALL HERNIA REPAIR LAPAROSCOPIC LYSIS OF ADHESIONS INSERTION OF MESH  SURGEON:  Surgeon(s): [name], MD  ASSISTANT: Olene Floss, PA-S, [employer]    ANESTHESIA:     General Local anesthesia field block: (0.25% bupivacaine & liposomal  Bupivacaine [Experel])   EBL:  Total I/O In: 1000 [I.V.:1000] Out: 150 [Urine:100; Blood:50]  Delay start of Pharmacological VTE agent (>24hrs) due to surgical blood loss or risk of bleeding:  no  DRAINS: none   SPECIMEN:  No Specimen  DISPOSITION OF SPECIMEN:  N/A  COUNTS:  YES  PLAN OF CARE: Admit for overnight observation  PATIENT DISPOSITION:  PACU - hemodynamically stable.  INDICATION: Pleasant patient has developed a ventral wall abdominal hernia.   Recommendation was made for surgical repair:  The anatomy & physiology of the abdominal wall was discussed. The pathophysiology of hernias was discussed. Natural history risks without surgery including progeressive enlargement, pain, incarceration & strangulation was discussed. Contributors to complications such as smoking, obesity, diabetes, prior surgery, etc were discussed.  I feel the risks of no intervention will lead to serious problems that outweigh the operative risks; therefore, I recommended surgery to reduce and repair the hernia. I explained laparoscopic techniques with possible need for an open approach. I noted the probable use of mesh to patch and/or buttress the  hernia repair  Risks such as bleeding, infection, abscess, need for further treatment, heart attack, death, and other risks were discussed. I noted a good likelihood this will help address the problem. Goals of post-operative recovery were discussed as well. Possibility that this will not correct all symptoms was explained. I stressed the importance of low-impact activity, aggressive pain control, avoiding constipation, & not pushing through pain to minimize risk of post-operative chronic pain or injury. Possibility of reherniation especially with smoking, obesity, diabetes, immunosuppression, and other health conditions was discussed. We will work to minimize complications.  An educational handout further explaining the pathology & treatment options was given as well. Questions were answered. The patient expresses understanding & wishes to proceed with surgery.   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  A few incarcerated with: Epiploic appendages and greater omentum.  Largest one suprapubic with preperitoneal fat.  Suspicious for dome of bladder up in there as well as confirmed from prior CT scan.  Type of repair - Laparoscopic underlay repair   Name of mesh - Bard Ventralight dual sided (polypropylene / Seprafilm)  Size of mesh - Height 33 cm, Width 25 cm  Orientation:  Vertical   Mesh overlap - 8-10 cm  Placement of mesh - Intraperitoneal underlay repair   DESCRIPTION:   Informed consent was confirmed. The patient underwent general anaesthesia without difficulty. The patient was positioned appropriately. VTE prevention in place. The patient's abdomen was clipped, prepped, & draped in a sterile fashion. Surgical timeout confirmed our plan.   The patient was positioned in reverse Trendelenburg. Abdominal entry was gained using optical entry technique in the left upper abdomen. Entry was clean. I  induced carbon dioxide insufflation.  Camera inspection revealed no injury. Extra ports were carefully placed under direct laparoscopic visualization.   I could see adhesions in the abdomen.  I did laparoscopic lysis of adhesions to expose the entire anterior abdominal wall.  I primarily used and focused cold scissors.    I scored the peritoneum at the level of the iliac crests and freed the peritoneum out of the most inferior hernia sac.  I freed the pubis and bladder down as well.  I made sure hemostasis was good.  Reduced out omentum & preperitoneal fat.  I mapped out the region using a needle passer.   To ensure that I would have at least 5 cm radial coverage outside of the hernia defect, I chose a 33x25 cm dual sided mesh.  I placed #1 Prolene stitches around its upper 2/3 edge about every 5 cm = 12 total.  I rolled the mesh & placed into the peritoneal cavity through the largest Swiss cheese hernia defect in the suprapubic midline region.  I primarily closed this hernia defect using #1 PDS interrupted suture.  I unrolled  the mesh and positioned it appropriately such that the lower third of the mesh was inferior to the pubic rim and covering the internal inguinal rings.  I secured the mesh to cover up the hernia defect using a laparoscopic suture passer to pass the tails of the Prolene through the abdominal wall & tagged them with clamps.  I started out in the sides and superior apex under the hernia defects appropriately, and then proceeded to work in quadrants.  I placed #1 Prolene suture in the suprapubic region just superior to the pubic bone.  Through the abdominal wall in and out the mesh and back out the abdominal wall.  Did that 4 to help tack the mesh well.  Then placed some central Prolene sutures through the abdominal wall and out the mesh.  I used that to help tack the peritoneum back to the anterior abdominal wall.  I tacked it up higher to help encourage the peritoneum and bladder dome to lay more cephalad than before.  I took  care to stay away from the bladder itself.  We evacuated CO2 & desufflated the abdomen.  I tied the fascial stitches down.  I reinsufflated the abdomen.  The mesh provided at least 5-10 cm circumferential coverage around the entire region of hernia defects.   I tacked the edges & central part of the mesh to the peritoneum/posterior rectus fascia with  SecureStrap absorbable tacks.   A good field block of local anesthesia was used at fascial stitch sites & fascial closure areas.  Hemostasis was excellent.  I did reinspection. Hemostasis was good. Mesh laid well. Capnoperitoneum was evacuated. Ports were removed. The skin was closed with Monocryl at the port sites and Steri-Strips on the fascial stitch puncture sites.    Patient is being extubated to go to the recovery room. I discussed operative findings, updated the patient's status, discussed probable steps to recovery, and gave postoperative recommendations to the patient's fiancee.  Recommendations were made.  Questions were answered.  He expressed understanding & appreciation.  Adin Hector, M.D., F.A.C.S. Gastrointestinal and Minimally Invasive Surgery [facility] [address], [place], [place] [address] [phone] Main / Paging

## 2015-11-15 NOTE — H&P (Signed)
Kathleen Hatfield. Coble 05/13/2015 2:19 PM Location: [facility] Patient #: [id number] DOB: 01-19-1956 Single / Language: Cleophus Molt / Race: White Female  Patient Care Team: Myer Peer, MD as PCP - General (Unknown Physician Specialty) [name], MD as Consulting Physician (Pulmonary Disease) [name], MD as Consulting Physician (General Surgery) Rush Landmark, MD as Consulting Physician (Pulmonary Disease)   History of Present Illness Patient words: hernia.  The patient is a 60 year old female who presents with an incisional hernia. Note for "Incisional hernia": Patient sent from  emergency Department for consultation. Dr. Leonard Schwartz. Concern for symptomatic ventral incisional hernias  Pleasant morbidly obese female. History of myomectomies and hysterectomies in the past. Dr Cletis Media. 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 Noted on colonoscopy. Was massively distended with a lot of pain. Required urgent hand-assisted sigmoid colectomy 2010. Had chronic wound drainage periumbilically on her low midline extraction HandPort site. Eventually closed. Came back 6 months later with some recurrent abdominal pain. Offered CT scan to rule out hernia or obstruction. If that was negative and possible injection nerve blocks for ossicle skeletal pain. Possible need for hernia repair if evidence. She never came back.   Patient notes pain seemed to have gone away years ago. Then began to have recurrent lower abdominal pain. Intensified. Concerned her. Went to the emergency room last month. Did not seem classic for diverticulitis. CT scan showed no evidence of diverticulitis. However patient had periumbilical and suprapubic incisional hernias. Dome of bladder up in the suprapubic region. No evidence of obstruction nor infection. Surgical consultation requested. Patient has less pain now. Usually has a bowel movement about every day.  Last colonoscopy in 2012. She does have reactive airway disease and obstructive sleep apnea. Followed by [place] [facility]. Seems to be stable on inhalers. Walkabout 15 minutes before she has to stop. She has not had any more attacks of diverticulitis that she knows of. Moving her bowels every day. Has a lot of musculoskeletal and back pain issues. That is relatively stable. She's never smoked. She has mild urinary incontinence to coughing or sneezing. Not severe. No urinary tract infections.  Sleep apnea under control.  Cleared by pulmonary.  No new events.    Other Problems Marjean Donna, CMA; 05/13/2015 2:19 PM) Anxiety Disorder Asthma Back Pain Bladder Problems Chest pain Chronic Obstructive Lung Disease Congestive Heart Failure Diabetes Mellitus Diverticulosis Gastroesophageal Reflux Disease High blood pressure Hypercholesterolemia Oophorectomy Bilateral. Sleep Apnea Transfusion history Ulcerative Colitis  Past Surgical History Marjean Donna, CMA; 05/13/2015 2:19 PM) Appendectomy Breast Biopsy Bilateral. Cesarean Section - 1 Colon Polyp Removal - Colonoscopy Colon Removal - Partial Gallbladder Surgery - Laparoscopic Hysterectomy (due to cancer) - Complete Resection of Small Bowel Tonsillectomy  Diagnostic Studies History Marjean Donna, CMA; 05/13/2015 2:19 PM) Colonoscopy 1-5 years ago Mammogram >3 years ago Pap Smear 1-5 years ago  Allergies Marjean Donna, CMA; 05/13/2015 2:21 PM) Ceclor *CEPHALOSPORINS* Levaquin *FLUOROQUINOLONES* Sulfa Antibiotics Almond Oil (Sweet) *CHEMICALS*  Medication History (Sonya Bynum, CMA; 05/13/2015 2:22 PM) LORazepam (1MG Tablet, Oral) Active. Anoro Ellipta (62.5-25MCG/INH Aero Pow Br Act, Inhalation) Active. MetFORMIN HCl ER (750MG Tablet ER 24HR, Oral) Active. Losartan Potassium (50MG Tablet, Oral) Active. Gabapentin (100MG Capsule, Oral) Active. Fenofibrate (145MG Tablet, Oral)  Active. Estradiol (2MG Tablet, Oral) Active. DULoxetine HCl (60MG Capsule DR Part, Oral) Active. Oxycodone-Acetaminophen (5-325MG Tablet, Oral as needed) Active. Pantoprazole Sodium (40MG Tablet DR, Oral) Active. ROPINIRole HCl (4MG Tablet, Oral) Active. Torsemide (20MG Tablet, Oral) Active. Victoza (  18MG/3ML Soln Pen-inj, Subcutaneous) Active. Potassium Chloride Crys ER (10MEQ Tablet ER, Oral) Active. Dicyclomine HCl (20MG Tablet, Oral) Active. Rosuvastatin Calcium (10MG Tablet, Oral) Active. Medications Reconciled  Social History Marjean Donna, CMA; 05/13/2015 2:19 PM) Alcohol use Occasional alcohol use. Caffeine use Carbonated beverages, Tea. No drug use Tobacco use Never smoker.  Family History Marjean Donna, [place]; 05/13/2015 2:19 PM) Cerebrovascular Accident Mother. Hypertension Brother, Father. Melanoma Father. Respiratory Condition Father.  Pregnancy / Birth History Marjean Donna, [place]; 05/13/2015 2:19 PM) Age of menopause 11-50 Gravida 1 Maternal age 15-30 Para 1    Review of Systems ([place]; 05/13/2015 2:19 PM) General Not Present- Appetite Loss, Chills, Fatigue, Fever, Night Sweats, Weight Gain and Weight Loss. Skin Present- Non-Healing Wounds. Not Present- Change in Wart/Mole, Dryness, Hives, Jaundice, New Lesions, Rash and Ulcer. HEENT Present- Ringing in the Ears, Seasonal Allergies and Wears glasses/contact lenses. Not Present- Earache, Hearing Loss, Hoarseness, Nose Bleed, Oral Ulcers, Sinus Pain, Sore Throat, Visual Disturbances and Yellow Eyes. Respiratory Present- Difficulty Breathing and Wheezing. Not Present- Bloody sputum, Chronic Cough and Snoring. Breast Not Present- Breast Mass, Breast Pain, Nipple Discharge and Skin Changes. Cardiovascular Present- Difficulty Breathing Lying Down, Leg Cramps and Shortness of Breath. Not Present- Chest Pain, Palpitations, Rapid Heart Rate and Swelling of Extremities. Gastrointestinal Present- Change  in Bowel Habits and Difficulty Swallowing. Not Present- Abdominal Pain, Bloating, Bloody Stool, Chronic diarrhea, Constipation, Excessive gas, Gets full quickly at meals, Hemorrhoids, Indigestion, Nausea, Rectal Pain and Vomiting. Female Genitourinary Present- Pelvic Pain and Urgency. Not Present- Frequency, Nocturia and Painful Urination. Musculoskeletal Present- Back Pain. Not Present- Joint Pain, Joint Stiffness, Muscle Pain, Muscle Weakness and Swelling of Extremities. Neurological Present- Tremor and Trouble walking. Not Present- Decreased Memory, Fainting, Headaches, Numbness, Seizures, Tingling and Weakness. Psychiatric Present- Anxiety and Change in Sleep Pattern. Not Present- Bipolar, Depression, Fearful and Frequent crying. Endocrine Present- Heat Intolerance and New Diabetes. Not Present- Cold Intolerance, Excessive Hunger, Hair Changes and Hot flashes. Hematology Not Present- Easy Bruising, Excessive bleeding, Gland problems, HIV and Persistent Infections.  Vitals  05/13/2015 2:19 PM Weight: 267 lb Height: 65in Body Surface Area: 2.24 m Body Mass Index: 44.43 kg/m  Pulse: 79 (Regular)  BP: 142/82 (Sitting, Left Arm, Standard)   BP 126/69   Pulse 65   Temp 98.3 F (36.8 C) (Oral)   Resp 18   Ht 5' 5"  (1.651 m)   Wt 120.2 kg (265 lb)   SpO2 96%   BMI 44.10 kg/m      Physical Exam  General Mental Status-Alert. General Appearance-Not in acute distress, Not Sickly. Orientation-Oriented X3. Hydration-Well hydrated. Voice-Normal.  Integumentary Global Assessment Upon inspection and palpation of skin surfaces of the - Axillae: non-tender, no inflammation or ulceration, no drainage. and Distribution of scalp and body hair is normal. General Characteristics Temperature - normal warmth is noted.  Head and Neck Head-normocephalic, atraumatic with no lesions or palpable masses. Face Global Assessment - atraumatic, no absence of  expression. Neck Global Assessment - no abnormal movements, no bruit auscultated on the right, no bruit auscultated on the left, no decreased range of motion, non-tender. Trachea-midline. Thyroid Gland Characteristics - non-tender.  Eye Eyeball - Left-Extraocular movements intact, No Nystagmus. Eyeball - Right-Extraocular movements intact, No Nystagmus. Cornea - Left-No Hazy. Cornea - Right-No Hazy. Sclera/Conjunctiva - Left-No scleral icterus, No Discharge. Sclera/Conjunctiva - Right-No scleral icterus, No Discharge. Pupil - Left-Direct reaction to light normal. Pupil - Right-Direct reaction to light normal.  ENMT Ears Pinna - Left -  no drainage observed, no generalized tenderness observed. Right - no drainage observed, no generalized tenderness observed. Nose and Sinuses External Inspection of the Nose - no destructive lesion observed. Inspection of the nares - Left - quiet respiration. Right - quiet respiration. Mouth and Throat Lips - Upper Lip - no fissures observed, no pallor noted. Lower Lip - no fissures observed, no pallor noted. Nasopharynx - no discharge present. Oral Cavity/Oropharynx - Tongue - no dryness observed. Oral Mucosa - no cyanosis observed. Hypopharynx - no evidence of airway distress observed.  Chest and Lung Exam Inspection Movements - Normal and Symmetrical. Accessory muscles - No use of accessory muscles in breathing. Palpation Palpation of the chest reveals - Non-tender. Auscultation Breath sounds - Normal and Clear.  Cardiovascular Auscultation Rhythm - Regular. Murmurs & Other Heart Sounds - Auscultation of the heart reveals - No Murmurs and No Systolic Clicks.  Abdomen Inspection Inspection of the abdomen reveals - No Visible peristalsis and No Abnormal pulsations. Umbilicus - No Bleeding, No Urine drainage. Palpation/Percussion Palpation and Percussion of the abdomen reveal - Soft, Non Tender, No Rebound tenderness, No  Rigidity (guarding) and No Cutaneous hyperesthesia.  Note: Morbid obesity but soft. Periumbilical hernias felt reducible. 5 cm region. Very sensitive 4 cm not felt to the RIGHT of midline suprapubic region. Correlates with the CT scan showing knuckle of dome of bladder incarcerated in the suprapubic midline.   Female Genitourinary Sexual Maturity Tanner 5 - Adult hair pattern. Note: No vaginal bleeding nor discharge   Peripheral Vascular Upper Extremity Inspection - Left - No Cyanotic nailbeds, Not Ischemic. Right - No Cyanotic nailbeds, Not Ischemic.  Neurologic Neurologic evaluation reveals -normal attention span and ability to concentrate, able to name objects and repeat phrases. Appropriate fund of knowledge , normal sensation and normal coordination. Mental Status Affect - not angry, not paranoid. Cranial Nerves-Normal Bilaterally. Gait-Normal.  Neuropsychiatric Mental status exam performed with findings of-able to articulate well with normal speech/language, rate, volume and coherence, thought content normal with ability to perform basic computations and apply abstract reasoning and no evidence of hallucinations, delusions, obsessions or homicidal/suicidal ideation.  Musculoskeletal Global Assessment Spine, Ribs and Pelvis - no instability, subluxation or laxity. Right Upper Extremity - no instability, subluxation or laxity.  Lymphatic Head & Neck  General Head & Neck Lymphatics: Bilateral - Description - No Localized lymphadenopathy. Axillary  General Axillary Region: Bilateral - Description - No Localized lymphadenopathy. Femoral & Inguinal  Generalized Femoral & Inguinal Lymphatics: Left - Description - No Localized lymphadenopathy. Right - Description - No Localized lymphadenopathy.    Assessment & Plan  INCARCERATED INCISIONAL HERNIA (K43.0) Impression: Periumbilical mostly reducible hernias and incarcerated suprapubic hernia from prior low midline  and Pfannenstiel incisions. Status post emergent colectomy 2010 prior GYN surgery.  Given the fact she is rather sensitive in the suprapubic region and it is incarcerated with bladder, I recommended surgical ventral hernia repair back in 2011. Apparently lost to follow-up. Now returns with enlarging hernia there and new hernia with incarcerated bladder.  I think she needs laparoscopic repair. LAp approach. Needs to have the bladder taken down to get mesh below the pubis to avoid recurrence. She is interested in proceeding. We'll work to coordinate a convenient time.  I did offer her to see a urologist to see if she needed a bladder tack with her urinary incontinence. It did not seem particular bothersome to her, so she wishes to hold off and proceeded with my surgery first.  Current Plans Because  of her reactive airway disease, She did obtain preoperative pulmonary clearance.  Recommendation of continue inhalers and using CPAP. Just want to be sure.  .CPAP post op  ENCOUNTER FOR PRE-OPERATIVE EXAMINATION - [place] (K24.097) Current Plans You are being scheduled for surgery - Our schedulers will call you.  You should hear from our office's scheduling department within 5 working days about the location, date, and time of surgery. We try to make accommodations for patient's preferences in scheduling surgery, but sometimes the OR schedule or the surgeon's schedule prevents [place] from making those accommodations.  If you have not heard from our office [phone]) in 5 working days, call the office and ask for your surgeon's nurse.  If you have other questions about your diagnosis, plan, or surgery, call the office and ask for your surgeon's nurse.  Written instructions provided The anatomy & physiology of the abdominal wall was discussed. The pathophysiology of hernias was discussed. Natural history risks without surgery including progeressive enlargement, pain, incarceration, & strangulation was  discussed. Contributors to complications such as smoking, obesity, diabetes, prior surgery, etc were discussed.  I feel the risks of no intervention will lead to serious problems that outweigh the operative risks; therefore, I recommended surgery to reduce and repair the hernia. I explained laparoscopic techniques with possible need for an open approach. I noted the probable use of mesh to patch and/or buttress the hernia repair  Risks such as bleeding, infection, abscess, need for further treatment, heart attack, death, and other risks were discussed. I noted a good likelihood this will help address the problem. Goals of post-operative recovery were discussed as well. Possibility that this will not correct all symptoms was explained. I stressed the importance of low-impact activity, aggressive pain control, avoiding constipation, & not pushing through pain to minimize risk of post-operative chronic pain or injury. Possibility of reherniation especially with smoking, obesity, diabetes, immunosuppression, and other health conditions was discussed. We will work to minimize complications.  An educational handout further explaining the pathology & treatment options was given as well. Questions were answered. The patient expresses understanding & wishes to proceed with surgery.  Pt Education - Pamphlet Given - Laparoscopic Hernia Repair: discussed with patient and provided information. Pt Education - [facility] Hernia Post-Op HCI (Lavonya Hoerner): discussed with patient and provided information. Pt Education - [facility] Pain Control (Haidar Muse)  Adin Hector, M.D., F.A.C.S. Gastrointestinal and Minimally Invasive Surgery [facility] [address], [place], [place] [id number] [phone] Main / Paging

## 2015-11-15 NOTE — Anesthesia Preprocedure Evaluation (Addendum)
Anesthesia Evaluation  Patient identified by MRN, date of birth, ID band Patient awake    Reviewed: Allergy & Precautions, NPO status , Patient's Chart, lab work & pertinent test results  Airway Mallampati: II  TM Distance: >3 FB Neck ROM: Full    Dental no notable dental hx.    Pulmonary asthma , sleep apnea ,    breath sounds clear to auscultation + decreased breath sounds      Cardiovascular hypertension, Normal cardiovascular exam Rhythm:Regular Rate:Normal     Neuro/Psych negative neurological ROS  negative psych ROS   GI/Hepatic Neg liver ROS, GERD  ,  Endo/Other  diabetesMorbid obesity  Renal/GU negative Renal ROS  negative genitourinary   Musculoskeletal negative musculoskeletal ROS (+)   Abdominal (+) + obese,   Peds negative pediatric ROS (+)  Hematology negative hematology ROS (+)   Anesthesia Other Findings   Reproductive/Obstetrics negative OB ROS                             Anesthesia Physical Anesthesia Plan  ASA: III  Anesthesia Plan: General   Post-op Pain Management:    Induction: Intravenous  Airway Management Planned: Oral ETT  Additional Equipment:   Intra-op Plan:   Post-operative Plan: Extubation in OR  Informed Consent: I have reviewed the patients History and Physical, chart, labs and discussed the procedure including the risks, benefits and alternatives for the proposed anesthesia with the patient or authorized representative who has indicated his/her understanding and acceptance.   Dental advisory given  Plan Discussed with: CRNA and Surgeon  Anesthesia Plan Comments:         Anesthesia Quick Evaluation

## 2015-11-15 NOTE — Discharge Instructions (Signed)
HERNIA REPAIR: POST OP INSTRUCTIONS  ######################################################################  EAT Gradually transition to a high fiber diet with a fiber supplement over the next few weeks after discharge.  Start with a pureed / full liquid diet (see below)  WALK Walk an hour a day.  Control your pain to do that.    CONTROL PAIN Control pain so that you can walk, sleep, tolerate sneezing/coughing, go up/down stairs.  HAVE A BOWEL MOVEMENT DAILY Keep your bowels regular to avoid problems.  OK to try a laxative to override constipation.  OK to use an antidairrheal to slow down diarrhea.  Call if not better after 2 tries  CALL IF YOU HAVE PROBLEMS/CONCERNS Call if you are still struggling despite following these instructions. Call if you have concerns not answered by these instructions  ######################################################################    1. DIET: Follow a light bland diet the first 24 hours after arrival home, such as soup, liquids, crackers, etc.  Be sure to include lots of fluids daily.  Avoid fast food or heavy meals as your are more likely to get nauseated.  Eat a low fat the next few days after surgery. 2. Take your usually prescribed home medications unless otherwise directed. 3. PAIN CONTROL: a. Pain is best controlled by a usual combination of three different methods TOGETHER: i. Ice/Heat ii. Over the counter pain medication iii. Prescription pain medication b. Most patients will experience some swelling and bruising around the hernia(s) such as the bellybutton, groins, or old incisions.  Ice packs or heating pads (30-60 minutes up to 6 times a day) will help. Use ice for the first few days to help decrease swelling and bruising, then switch to heat to help relax tight/sore spots and speed recovery.  Some people prefer to use ice alone, heat alone, alternating between ice & heat.  Experiment to what works for you.  Swelling and bruising can take  several weeks to resolve.   c. It is helpful to take an over-the-counter pain medication regularly for the first few weeks.  Choose one of the following that works best for you: i. Naproxen (Aleve, etc)  Two 25m tabs twice a day ii. Ibuprofen (Advil, etc) Three 2013mtabs four times a day (every meal & bedtime) iii. Acetaminophen (Tylenol, etc) 325-65021mour times a day (every meal & bedtime) d. A  prescription for pain medication should be given to you upon discharge.  Take your pain medication as prescribed.  i. If you are having problems/concerns with the prescription medicine (does not control pain, nausea, vomiting, rash, itching, etc), please call us [phone] see if we need to switch you to a different pain medicine that will work better for you and/or control your side effect better. ii. If you need a refill on your pain medication, please contact your pharmacy.  They will contact our office to request authorization. Prescriptions will not be filled after 5 pm or on week-ends. 4. Avoid getting constipated.  Between the surgery and the pain medications, it is common to experience some constipation.  Increasing fluid intake and taking a fiber supplement (such as Metamucil, Citrucel, FiberCon, MiraLax, etc) 1-2 times a day regularly will usually help prevent this problem from occurring.  A mild laxative (prune juice, Milk of Magnesia, MiraLax, etc) should be taken according to package directions if there are no bowel movements after 48 hours.   5. Wash / shower every day.  You may shower over the dressings as they are waterproof.   6. Remove  your waterproof bandages 5 days after surgery.  You may leave the incision open to air.  You may replace a dressing/Band-Aid to cover the incision for comfort if you wish.  Continue to shower over incision(s) after the dressing is off.    7. ACTIVITIES as tolerated:   a. You may resume regular (light) daily activities beginning the next day--such  as daily self-care, walking, climbing stairs--gradually increasing activities as tolerated.  If you can walk 30 minutes without difficulty, it is safe to try more intense activity such as jogging, treadmill, bicycling, low-impact aerobics, swimming, etc. b. Save the most intensive and strenuous activity for last such as sit-ups, heavy lifting, contact sports, etc  Refrain from any heavy lifting or straining until you are off narcotics for pain control.   c. DO NOT PUSH THROUGH PAIN.  Let pain be your guide: If it hurts to do something, don't do it.  Pain is your body warning you to avoid that activity for another week until the pain goes down. d. You may drive when you are no longer taking prescription pain medication, you can comfortably wear a seatbelt, and you can safely maneuver your car and apply brakes. e. Dennis Bast may have sexual intercourse when it is comfortable.  8. FOLLOW UP in our office a. Please call [facility] at [phone] to set up an appointment to see your surgeon in the office for a follow-up appointment approximately 2-3 weeks after your surgery. b. Make sure that you call for this appointment the day you arrive home to insure a convenient appointment time. 9.  IF YOU HAVE DISABILITY OR FAMILY LEAVE FORMS, BRING THEM TO THE OFFICE FOR PROCESSING.  DO NOT GIVE THEM TO YOUR DOCTOR.  WHEN TO CALL us [phone]: 1. Poor pain control 2. Reactions / problems with new medications (rash/itching, nausea, etc)  3. Fever over 101.5 F (38.5 C) 4. Inability to urinate 5. Nausea and/or vomiting 6. Worsening swelling or bruising 7. Continued bleeding from incision. 8. Increased pain, redness, or drainage from the incision   The clinic staff is available to answer your questions during regular business hours (8:30am-5pm).  Please dont hesitate to call and ask to speak to one of our nurses for clinical concerns.   If you have a medical emergency, go to the nearest emergency room or call  911.  A surgeon from [facility] is always on call at the hospitals in [facility], [place], [place], [place], [place]  [address] ?  [address] [address], [place], [place]   [address] MAIN: [phone] ? TOLL FREE: [phone] ? FAX: [phone] [email or web address]   Hiatal Hernia A hiatal hernia occurs when part of your stomach slides above the muscle that separates your abdomen from your chest (diaphragm). You can be born with a hiatal hernia (congenital), or it may develop over time. In almost all cases of hiatal hernia, only the top part of the stomach pushes through.  Many people have a hiatal hernia with no symptoms. The larger the hernia, the more likely that you will have symptoms. In some cases, a hiatal hernia allows stomach acid to flow back into the tube that carries food from your mouth to your stomach (esophagus). This may cause heartburn symptoms. Severe heartburn symptoms may mean you have developed a condition called gastroesophageal reflux disease (GERD).  CAUSES  Hiatal hernias are caused by a weakness in the opening (hiatus) where your esophagus passes through your  diaphragm to attach to the upper part of your stomach. You may be born with a weakness in your hiatus, or a weakness can develop. RISK FACTORS Older age is a major risk factor for a hiatal hernia. Anything that increases pressure on your diaphragm can also increase your risk of a hiatal hernia. This includes:  Pregnancy.  Excess weight.  Frequent constipation. SIGNS AND SYMPTOMS  People with a hiatal hernia often have no symptoms. If symptoms develop, they are almost always caused by GERD. They may include:  Heartburn.  Belching.  Indigestion.  Trouble swallowing.  Coughing or wheezing.  Sore throat.  Hoarseness.  Chest pain. DIAGNOSIS  A hiatal hernia is sometimes found during an exam for another problem. Your health care provider  may suspect a hiatal hernia if you have symptoms of GERD. Tests may be done to diagnose GERD. These may include:  X-rays of your stomach or chest.  An upper gastrointestinal (GI) series. This is an X-ray exam of your GI tract involving the use of a chalky liquid that you swallow. The liquid shows up clearly on the X-ray.  Endoscopy. This is a procedure to look into your stomach using a thin, flexible tube that has a tiny camera and light on the end of it. TREATMENT  If you have no symptoms, you may not need treatment. If you have symptoms, treatment may include:  Dietary and lifestyle changes to help reduce GERD symptoms.  Medicines. These may include:  Over-the-counter antacids.  Medicines that make your stomach empty more quickly.  Medicines that block the production of stomach acid (H2 blockers).  Stronger medicines to reduce stomach acid (proton pump inhibitors).  You may need surgery to repair the hernia if other treatments are not helping. HOME CARE INSTRUCTIONS   Take all medicines as directed by your health care provider.  Quit smoking, if you smoke.  Try to achieve and maintain a healthy body weight.  Eat frequent small meals instead of three large meals a day. This keeps your stomach from getting too full.  Eat slowly.  Do not lie down right after eating.  Do noteat 1-2 hours before bed.   Do not drink beverages with caffeine. These include cola, coffee, cocoa, and tea.  Do not drink alcohol.  Avoid foods that can make symptoms of GERD worse. These may include:  Fatty foods.  Citrus fruits.  Other foods and drinks that contain acid.  Avoid putting pressure on your belly. Anything that puts pressure on your belly increases the amount of acid that may be pushed up into your esophagus.   Avoid bending over, especially after eating.  Raise the head of your bed by putting blocks under the legs. This keeps your head and esophagus higher than your  stomach.  Do not wear tight clothing around your chest or stomach.  Try not to strain when having a bowel movement, when urinating, or when lifting heavy objects. SEEK MEDICAL CARE IF:  Your symptoms are not controlled with medicines or lifestyle changes.  You are having trouble swallowing.  You have coughing or wheezing that will not go away. SEEK IMMEDIATE MEDICAL CARE IF:  Your pain is getting worse.  Your pain spreads to your arms, neck, jaw, teeth, or back.  You have shortness of breath.  You sweat for no reason.  You feel sick to your stomach (nauseous) or vomit.  You vomit blood.  You have bright red blood in your stools.  You have black, tarry stools.  This information is not intended to replace advice given to you by your health care provider. Make sure you discuss any questions you have with your health care provider.   Document Released: 05/16/2003 Document Revised: 03/16/2014 Document Reviewed: 02/10/2013 Elsevier Interactive Patient Education [employer].

## 2015-11-15 NOTE — Anesthesia Postprocedure Evaluation (Signed)
Anesthesia Post Note  Patient: LEILANA MCQUIRE  Procedure(s) Performed: Procedure(s) (LRB): LAPAROSCOPIC VENTRAL WALL HERNIA REPAIR (N/A) LAPAROSCOPIC LYSIS OF ADHESIONS (N/A) INSERTION OF MESH (N/A)  Patient location during evaluation: PACU Anesthesia Type: General Level of consciousness: awake and alert Pain management: pain level controlled Vital Signs Assessment: post-procedure vital signs reviewed and stable Respiratory status: spontaneous breathing, nonlabored ventilation, respiratory function stable and patient connected to nasal cannula oxygen Cardiovascular status: blood pressure returned to baseline and stable Postop Assessment: no signs of nausea or vomiting Anesthetic complications: no    Last Vitals:  Vitals:   11/15/15 1600 11/15/15 1615  BP: (!) 152/81 (!) 148/75  Pulse: 92 90  Resp: 15 16  Temp:      Last Pain:  Vitals:   11/15/15 1615  TempSrc:   PainSc: 7                  Jasmaine Rochel S

## 2015-11-15 NOTE — Anesthesia Procedure Notes (Signed)
Procedure Name: Intubation Date/Time: 11/15/2015 11:55 AM Performed by: Deliah Boston Pre-anesthesia Checklist: Patient identified, Emergency Drugs available, Suction available and Patient being monitored Patient Re-evaluated:Patient Re-evaluated prior to inductionOxygen Delivery Method: Circle system utilized Preoxygenation: Pre-oxygenation with 100% oxygen Intubation Type: IV induction Ventilation: Mask ventilation without difficulty Laryngoscope Size: Mac and 3 Grade View: Grade II Tube type: Oral Tube size: 7.0 mm Number of attempts: 2 Airway Equipment and Method: Stylet and Oral airway Placement Confirmation: ETT inserted through vocal cords under direct vision,  positive ETCO2 and breath sounds checked- equal and bilateral Secured at: 21 cm Tube secured with: Tape Dental Injury: Teeth and Oropharynx as per pre-operative assessment

## 2015-11-15 NOTE — Transfer of Care (Signed)
Immediate Anesthesia Transfer of Care Note  Patient: Kathleen Hatfield  Procedure(s) Performed: Procedure(s): LAPAROSCOPIC VENTRAL WALL HERNIA REPAIR (N/A) LAPAROSCOPIC LYSIS OF ADHESIONS (N/A) INSERTION OF MESH (N/A)  Patient Location: PACU  Anesthesia Type:General  Level of Consciousness: awake, alert  and oriented  Airway & Oxygen Therapy: Patient Spontanous Breathing and Patient connected to face mask oxygen  Post-op Assessment: Report given to RN and Post -op Vital signs reviewed and stable  Post vital signs: Reviewed and stable  Last Vitals:  Vitals:   11/15/15 0949  BP: 126/69  Pulse: 65  Resp: 18  Temp: 36.8 C    Last Pain:  Vitals:   11/15/15 1018  TempSrc:   PainSc: 5       Patients Stated Pain Goal: 5 (53/29/92 4268)  Complications: No apparent anesthesia complications

## 2015-11-16 LAB — GLUCOSE, CAPILLARY
Glucose-Capillary: 137 mg/dL — ABNORMAL HIGH (ref 65–99)
Glucose-Capillary: 125 mg/dL — ABNORMAL HIGH (ref 65–99)
Glucose-Capillary: 121 mg/dL — ABNORMAL HIGH (ref 65–99)
Glucose-Capillary: 159 mg/dL — ABNORMAL HIGH (ref 65–99)

## 2015-11-16 NOTE — Progress Notes (Signed)
Pt. States she can place herself on cpap when she is ready. RT informed pt. To notify if she needs any assistance.

## 2015-11-16 NOTE — Progress Notes (Signed)
Patient ID: Kathleen Hatfield, female   DOB: June 20, 1955, 60 y.o.   MRN: [id number] [facility] Progress Note:   1 Day Post-Op  Subjective: Mental status is clear.  Pain controlled Objective: Vital signs in last 24 hours: Temp:  [97.6 F (36.4 C)-98.6 F (37 C)] 97.6 F (36.4 C) (09/09 0702) Pulse Rate:  [66-97] 66 (09/09 0702) Resp:  [10-21] 18 (09/09 0702) BP: (108-171)/(63-81) 111/72 (09/09 0702) SpO2:  [89 %-100 %] 98 % (09/09 0702)  Intake/Output from previous day: 09/08 0701 - 09/09 0700 In: 2020 [I.V.:1970; IV Piggyback:50] Out: 1310 [Urine:1260; Blood:50] Intake/Output this shift: Total I/O In: 480 [P.O.:480] Out: -   Physical Exam: Work of breathing is not labored.  Binder in place.  Needs to get up and walk more.  On MRSA isolation  Lab Results:  Results for orders placed or performed during the hospital encounter of 11/15/15 (from the past 48 hour(s))  Glucose, capillary     Status: Abnormal   Collection Time: 11/15/15  9:47 AM  Result Value Ref Range   Glucose-Capillary 122 (H) 65 - 99 mg/dL  Glucose, capillary     Status: Abnormal   Collection Time: 11/15/15  3:57 PM  Result Value Ref Range   Glucose-Capillary 163 (H) 65 - 99 mg/dL  Glucose, capillary     Status: Abnormal   Collection Time: 11/15/15  9:00 PM  Result Value Ref Range   Glucose-Capillary 193 (H) 65 - 99 mg/dL  Glucose, capillary     Status: Abnormal   Collection Time: 11/16/15  7:49 AM  Result Value Ref Range   Glucose-Capillary 137 (H) 65 - 99 mg/dL    Radiology/Results: No results found.  Anti-infectives: Anti-infectives    Start     Dose/Rate Route Frequency Ordered Stop   11/15/15 2000  clindamycin (CLEOCIN) IVPB 900 mg     900 mg 100 mL/hr over 30 Minutes Intravenous Every 6 hours 11/15/15 1816 11/16/15 0129   11/15/15 1100  gentamicin (GARAMYCIN) 420 mg, clindamycin (CLEOCIN) 900 mg in dextrose 5 % 100 mL IVPB  Status:  Discontinued     233 mL/hr over 30 Minutes Intravenous  On call to O.R. 11/14/15 1336 11/15/15 1735      Assessment/Plan: Problem List: Patient Active Problem List   Diagnosis Date Noted  . Incisional hernia 11/15/2015  . Pre-operative clearance 10/14/2015  . Hypersomnia with sleep apnea 09/16/2015  . Fatigue 06/07/2015  . Morbid obesity ([place]) 06/07/2015  . Severe obesity (BMI >= 40) ([place]) 04/21/2015  . Cough 04/18/2015  . Folliculitis 33/43/5686  . Extreme obesity ([place]) 04/11/2013  . Chronic diastolic heart failure ([place])   . OSA (obstructive sleep apnea) 08/23/2012  . Hypertension   . Depression   . GERD (gastroesophageal reflux disease)   . Hyperlipidemia   . Mild persistent chronic asthma without complication   . Dyspnea   . Ulcerative colitis ([place])   . Patellar tendinitis 05/25/2011  . Knee pain 05/25/2011  . Myofascial pain 05/25/2011  . Cervicalgia 05/25/2011    Stable;  Doing well.  Encourage walking  1 Day Post-Op    LOS: 0 days   Matt B. Hassell Done, MD, [facility] [phone] beeper [phone]  11/16/2015 10:23 AM

## 2015-11-17 LAB — GLUCOSE, CAPILLARY
Glucose-Capillary: 163 mg/dL — ABNORMAL HIGH (ref 65–99)
Glucose-Capillary: 212 mg/dL — ABNORMAL HIGH (ref 65–99)
Glucose-Capillary: 167 mg/dL — ABNORMAL HIGH (ref 65–99)
Glucose-Capillary: 147 mg/dL — ABNORMAL HIGH (ref 65–99)

## 2015-11-17 NOTE — Progress Notes (Signed)
Patient ID: Kathleen Hatfield, female   DOB: September 20, 1955, 60 y.o.   MRN: [id number]  [facility]  Subjective: POD#2  Patient in bed.  Some issues during night with respiratory status, CPAP machine.  Denies nausea or emesis.  Limited OOB and ambulation.  Objective: Vital signs in last 24 hours: Temp:  [98.3 F (36.8 C)-99.2 F (37.3 C)] 99.2 F (37.3 C) (09/10 0409) Pulse Rate:  [71-104] 104 (09/10 0409) Resp:  [16-20] 20 (09/10 0409) BP: (115-143)/(58-66) 137/66 (09/10 0409) SpO2:  [91 %-100 %] 91 % (09/10 0409)    Intake/Output from previous day: 09/09 0701 - 09/10 0700 In: 1240 [P.O.:1240] Out: 3675 [Urine:3675] Intake/Output this shift: No intake/output data recorded.  Physical Exam: HEENT - sclerae clear, mucous membranes moist Neck - soft Abdomen - protuberant; incisions intact; low midline wound with wicks, small serous drainage; binder replaced Ext - no edema, non-tender Neuro - alert & oriented, no focal deficits  Lab Results:  No results for input(s): WBC, HGB, HCT, PLT in the last 72 hours. BMET No results for input(s): NA, K, CL, CO2, GLUCOSE, BUN, CREATININE, CALCIUM in the last 72 hours. PT/INR No results for input(s): LABPROT, INR in the last 72 hours. Comprehensive Metabolic Panel:    Component Value Date/Time   NA 141 10/21/2015 1454   NA 139 04/14/2015 1256   K 3.9 10/21/2015 1454   K 4.0 04/14/2015 1256   CL 106 10/21/2015 1454   CL 99 (L) 04/14/2015 1256   CO2 27 10/21/2015 1454   CO2 29 04/14/2015 1256   BUN 18 10/21/2015 1454   BUN 14 04/14/2015 1256   CREATININE 1.22 (H) 10/21/2015 1454   CREATININE 1.22 (H) 04/14/2015 1256   GLUCOSE 115 (H) 10/21/2015 1454   GLUCOSE 115 (H) 04/14/2015 1256   CALCIUM 9.5 10/21/2015 1454   CALCIUM 9.8 04/14/2015 1256   AST 38 04/14/2015 1256   AST 31 03/24/2013 1632   ALT 27 04/14/2015 1256   ALT 23 03/24/2013 1632   ALKPHOS 53 04/14/2015 1256   ALKPHOS 93 03/24/2013 1632    BILITOT 0.5 04/14/2015 1256   BILITOT 0.2 (L) 03/24/2013 1632   PROT 7.3 04/14/2015 1256   PROT 7.8 03/24/2013 1632   ALBUMIN 3.7 04/14/2015 1256   ALBUMIN 3.4 (L) 03/24/2013 1632    Studies/Results: No results found.  Assessment & Plans: Status post lap LOA, incisional hernia repair  Advance to regular diet today per protocol  Encouraged OOB, ambulation  Monitor wounds  Pain Rx requested by patient  Earnstine Regal, MD, [facility] Office: [place] 11/17/2015

## 2015-11-17 NOTE — Progress Notes (Signed)
Pt voided 6 hours post cath removal 159m. When gotten up to void this morning patient attempted but could not void and was very distended. Bladder scanner showed 226min bladder. Notified Dr. MaHassell Donend obtained orders to place foley. [id number] urine returned. Dr. MarHassell Donetified.

## 2015-11-18 ENCOUNTER — Encounter (HOSPITAL_COMMUNITY): Payer: Self-pay | Admitting: Surgery

## 2015-11-18 DIAGNOSIS — R0902 Hypoxemia: Secondary | ICD-10-CM | POA: Diagnosis not present

## 2015-11-18 DIAGNOSIS — J45909 Unspecified asthma, uncomplicated: Secondary | ICD-10-CM | POA: Diagnosis not present

## 2015-11-18 DIAGNOSIS — K219 Gastro-esophageal reflux disease without esophagitis: Secondary | ICD-10-CM | POA: Diagnosis present

## 2015-11-18 DIAGNOSIS — G4733 Obstructive sleep apnea (adult) (pediatric): Secondary | ICD-10-CM | POA: Diagnosis not present

## 2015-11-18 DIAGNOSIS — R911 Solitary pulmonary nodule: Secondary | ICD-10-CM | POA: Diagnosis present

## 2015-11-18 DIAGNOSIS — I509 Heart failure, unspecified: Secondary | ICD-10-CM | POA: Diagnosis not present

## 2015-11-18 DIAGNOSIS — J9620 Acute and chronic respiratory failure, unspecified whether with hypoxia or hypercapnia: Secondary | ICD-10-CM | POA: Diagnosis not present

## 2015-11-18 DIAGNOSIS — R0689 Other abnormalities of breathing: Secondary | ICD-10-CM | POA: Diagnosis not present

## 2015-11-18 DIAGNOSIS — R269 Unspecified abnormalities of gait and mobility: Secondary | ICD-10-CM | POA: Diagnosis not present

## 2015-11-18 DIAGNOSIS — J9811 Atelectasis: Secondary | ICD-10-CM | POA: Diagnosis not present

## 2015-11-18 DIAGNOSIS — I5032 Chronic diastolic (congestive) heart failure: Secondary | ICD-10-CM | POA: Diagnosis present

## 2015-11-18 DIAGNOSIS — E119 Type 2 diabetes mellitus without complications: Secondary | ICD-10-CM | POA: Diagnosis not present

## 2015-11-18 DIAGNOSIS — J9601 Acute respiratory failure with hypoxia: Secondary | ICD-10-CM | POA: Diagnosis not present

## 2015-11-18 DIAGNOSIS — F419 Anxiety disorder, unspecified: Secondary | ICD-10-CM | POA: Diagnosis present

## 2015-11-18 DIAGNOSIS — Z6841 Body Mass Index (BMI) 40.0 and over, adult: Secondary | ICD-10-CM | POA: Diagnosis not present

## 2015-11-18 DIAGNOSIS — Z86010 Personal history of colonic polyps: Secondary | ICD-10-CM | POA: Diagnosis not present

## 2015-11-18 DIAGNOSIS — E7800 Pure hypercholesterolemia, unspecified: Secondary | ICD-10-CM | POA: Diagnosis present

## 2015-11-18 DIAGNOSIS — R339 Retention of urine, unspecified: Secondary | ICD-10-CM | POA: Diagnosis not present

## 2015-11-18 DIAGNOSIS — K430 Incisional hernia with obstruction, without gangrene: Secondary | ICD-10-CM | POA: Diagnosis not present

## 2015-11-18 DIAGNOSIS — K5792 Diverticulitis of intestine, part unspecified, without perforation or abscess without bleeding: Secondary | ICD-10-CM | POA: Diagnosis present

## 2015-11-18 DIAGNOSIS — K660 Peritoneal adhesions (postprocedural) (postinfection): Secondary | ICD-10-CM | POA: Diagnosis present

## 2015-11-18 DIAGNOSIS — J449 Chronic obstructive pulmonary disease, unspecified: Secondary | ICD-10-CM | POA: Diagnosis present

## 2015-11-18 DIAGNOSIS — Z888 Allergy status to other drugs, medicaments and biological substances status: Secondary | ICD-10-CM | POA: Diagnosis not present

## 2015-11-18 DIAGNOSIS — Z882 Allergy status to sulfonamides status: Secondary | ICD-10-CM | POA: Diagnosis not present

## 2015-11-18 DIAGNOSIS — J4530 Mild persistent asthma, uncomplicated: Secondary | ICD-10-CM | POA: Diagnosis present

## 2015-11-18 DIAGNOSIS — I110 Hypertensive heart disease with heart failure: Secondary | ICD-10-CM | POA: Diagnosis present

## 2015-11-18 LAB — CBC
HCT: 30.5 % — ABNORMAL LOW (ref 36.0–46.0)
Hemoglobin: 9.5 g/dL — ABNORMAL LOW (ref 12.0–15.0)
MCH: 25 pg — ABNORMAL LOW (ref 26.0–34.0)
MCHC: 31.1 g/dL (ref 30.0–36.0)
MCV: 80.3 fL (ref 78.0–100.0)
Platelets: 372 10*3/uL (ref 150–400)
RBC: 3.8 MIL/uL — ABNORMAL LOW (ref 3.87–5.11)
RDW: 17.8 % — ABNORMAL HIGH (ref 11.5–15.5)
WBC: 13.4 10*3/uL — ABNORMAL HIGH (ref 4.0–10.5)

## 2015-11-18 LAB — GLUCOSE, CAPILLARY
Glucose-Capillary: 109 mg/dL — ABNORMAL HIGH (ref 65–99)
Glucose-Capillary: 156 mg/dL — ABNORMAL HIGH (ref 65–99)

## 2015-11-18 LAB — BASIC METABOLIC PANEL
Anion gap: 9 (ref 5–15)
BUN: 19 mg/dL (ref 6–20)
CO2: 33 mmol/L — ABNORMAL HIGH (ref 22–32)
Calcium: 8.7 mg/dL — ABNORMAL LOW (ref 8.9–10.3)
Chloride: 94 mmol/L — ABNORMAL LOW (ref 101–111)
Creatinine, Ser: 1.45 mg/dL — ABNORMAL HIGH (ref 0.44–1.00)
GFR calc Af Amer: 44 mL/min — ABNORMAL LOW (ref >60)
GFR calc non Af Amer: 38 mL/min — ABNORMAL LOW (ref >60)
Glucose, Bld: 167 mg/dL — ABNORMAL HIGH (ref 65–99)
Potassium: 4.1 mmol/L (ref 3.5–5.1)
Sodium: 136 mmol/L (ref 135–145)

## 2015-11-18 MED ORDER — METFORMIN HCL ER 750 MG PO TB24
750.0000 mg | ORAL_TABLET | Freq: Every evening | ORAL | Status: DC
  Administered 2015-11-18 – 2015-11-19 (×2): 750 mg via ORAL
  Filled 2015-11-18 (×3): qty 1.5

## 2015-11-18 MED ORDER — BACLOFEN 10 MG PO TABS
5.0000 mg | ORAL_TABLET | Freq: Three times a day (TID) | ORAL | Status: DC
  Administered 2015-11-18 – 2015-11-20 (×7): 5 mg via ORAL
  Filled 2015-11-18 (×6): qty 1

## 2015-11-18 MED ORDER — ONDANSETRON HCL 4 MG/2ML IJ SOLN: 4.0000 mg | Freq: Four times a day (QID) | INTRAMUSCULAR | Status: DC | PRN

## 2015-11-18 MED ORDER — LORAZEPAM 0.5 MG PO TABS
0.5000 mg | ORAL_TABLET | Freq: Every day | ORAL | Status: DC
  Administered 2015-11-18: 0.5 mg via ORAL
  Filled 2015-11-18 (×2): qty 1

## 2015-11-18 MED ORDER — ONDANSETRON 4 MG PO TBDP: 4.0000 mg | ORAL_TABLET | Freq: Four times a day (QID) | ORAL | Status: DC | PRN

## 2015-11-18 MED ORDER — MORPHINE SULFATE (PF) 2 MG/ML IV SOLN: 2.0000 mg | INTRAVENOUS | Status: DC | PRN

## 2015-11-18 MED ORDER — PROCHLORPERAZINE EDISYLATE 5 MG/ML IJ SOLN: 5.0000 mg | INTRAMUSCULAR | Status: DC | PRN

## 2015-11-18 MED ORDER — OXYCODONE HCL 5 MG PO TABS
5.0000 mg | ORAL_TABLET | ORAL | Status: DC | PRN
  Administered 2015-11-18: 15 mg via ORAL
  Administered 2015-11-18: 5 mg via ORAL
  Administered 2015-11-18 – 2015-11-19 (×3): 15 mg via ORAL
  Filled 2015-11-18 (×3): qty 3
  Filled 2015-11-18: qty 1
  Filled 2015-11-18: qty 3

## 2015-11-18 MED ORDER — METOCLOPRAMIDE HCL 5 MG/ML IJ SOLN: 5.0000 mg | Freq: Four times a day (QID) | INTRAMUSCULAR | Status: DC | PRN

## 2015-11-18 MED ORDER — OXYCODONE HCL 5 MG PO TABS: 5.0000 mg | ORAL_TABLET | ORAL | Status: DC | PRN

## 2015-11-18 NOTE — Progress Notes (Deleted)
I and out cath 600 cc yellow urine.  Will continue to monitor.

## 2015-11-18 NOTE — Progress Notes (Signed)
OT Cancellation Note  Patient Details Name: Kathleen Hatfield MRN: [id number] DOB: 1956/02/27   Cancelled Treatment:    Pt declined as she had just been OOB. Will check back on pt as schedule allows [name], [name]  [name] 11/18/2015, 10:41 AM

## 2015-11-18 NOTE — Progress Notes (Addendum)
[address] [place], [place] [address] Phone: [phone] FAX: [phone]   Kathleen Hatfield [id number] April 20, 1955  CARE TEAM:  PCP: Ann Held, MD  Outpatient Care Team: Patient Care Team: Myer Peer, MD as PCP - General (Unknown Physician Specialty) [name], MD as Consulting Physician (Pulmonary Disease) [name], MD as Consulting Physician (General Surgery) Rush Landmark, MD as Consulting Physician (Pulmonary Disease)  Inpatient Treatment Team: Treatment Team: Attending Provider: [name], MD; Technician: Sueanne Margarita, NT; Technician: Abbe Amsterdam, NT; Technician: Florene Route, NT; Registered Nurse: Bailey Mech, RN; Registered Nurse: Janifer Adie, RN  Problem List:   Principal Problem:   Incisional hernia s/p lap repair with mesh 11/15/2015 Active Problems:   GERD (gastroesophageal reflux disease)   OSA (obstructive sleep apnea)   Morbid obesity ([place])   Urinary retention   3 Days Post-Op  11/15/2015  Procedure(s): LAPAROSCOPIC VENTRAL WALL HERNIA REPAIR LAPAROSCOPIC LYSIS OF ADHESIONS INSERTION OF MESH   Assessment  Fair pain control  Plan:  -switch pain control regimen -DM control -CPAP for OSA -try to wean oxygen -diuresis -retry removing foley -VTE prophylaxis- SCDs, etc -mobilize as tolerated to help recovery -May need HH or SNF - try to mobilize more  Adin Hector, M.D., F.A.C.S. Gastrointestinal and Minimally Invasive Surgery [facility] [address] [place], [place] [address] [phone] Main / Paging   11/18/2015  Subjective:  Sore Sleepy Walked to RN station  Objective:  Vital signs:  Vitals:   11/17/15 1444 11/17/15 2006 11/17/15 2201 11/18/15 0654  BP: 123/66  122/73 124/64  Pulse: 70 73 73 74  Resp: 16 17 18 16   Temp: 98.4 F (36.9 C)   98.3 F (36.8 C)  TempSrc: Axillary  Axillary  Axillary  SpO2: 92% 94% 94% 100%  Weight:      Height:        Last BM Date:  (PTA)  Intake/Output   Yesterday:  09/10 0701 - 09/11 0700 In: 1080 [P.O.:1080] Out: 1500 [Urine:1500] This shift:  No intake/output data recorded.  Bowel function:  Flatus: YES  BM:  No  Drain: (No drain)   Physical Exam:  General: Pt awake/alert/oriented x4 in mild acute distress Eyes: PERRL, normal EOM.  Sclera clear.  No icterus Neuro: CN II-XII intact w/o focal sensory/motor deficits. Lymph: No head/neck/groin lymphadenopathy Psych:  No delerium/psychosis/paranoia HENT: Normocephalic, Mucus membranes moist.  No thrush Neck: Supple, No tracheal deviation Chest: No chest wall pain w good excursion.  Oxygen in place CV:  Pulses intact.  Regular rhythm MS: Normal AROM mjr joints.  No obvious deformity Abdomen: Soft.  Nondistended.  Mioderately tender at incisions only.  No evidence of peritonitis.  No incarcerated hernias. Ext:  SCDs BLE.  No mjr edema.  No cyanosis Skin: No petechiae / purpura  Results:   Labs: Results for orders placed or performed during the hospital encounter of 11/15/15 (from the past 48 hour(s))  Glucose, capillary     Status: Abnormal   Collection Time: 11/16/15 12:28 PM  Result Value Ref Range   Glucose-Capillary 125 (H) 65 - 99 mg/dL  Glucose, capillary     Status: Abnormal   Collection Time: 11/16/15  5:14 PM  Result Value Ref Range   Glucose-Capillary 121 (H) 65 - 99 mg/dL  Glucose, capillary     Status: Abnormal   Collection Time: 11/16/15  9:14 PM  Result Value Ref Range   Glucose-Capillary 159 (H) 65 - 99 mg/dL  Glucose, capillary     Status: Abnormal   Collection Time: 11/17/15  8:26 AM  Result Value Ref Range   Glucose-Capillary 163 (H) 65 - 99 mg/dL   Comment 1 Notify RN   Glucose, capillary     Status: Abnormal   Collection Time: 11/17/15 12:14 PM  Result Value Ref Range   Glucose-Capillary 212 (H) 65 - 99 mg/dL   Comment 1 Notify RN    Glucose, capillary     Status: Abnormal   Collection Time: 11/17/15  4:37 PM  Result Value Ref Range   Glucose-Capillary 167 (H) 65 - 99 mg/dL  Glucose, capillary     Status: Abnormal   Collection Time: 11/17/15 10:17 PM  Result Value Ref Range   Glucose-Capillary 147 (H) 65 - 99 mg/dL  Glucose, capillary     Status: Abnormal   Collection Time: 11/18/15  7:22 AM  Result Value Ref Range   Glucose-Capillary 109 (H) 65 - 99 mg/dL    Imaging / Studies: No results found.  Medications / Allergies: per chart  Antibiotics: Anti-infectives    Start     Dose/Rate Route Frequency Ordered Stop   11/15/15 2000  clindamycin (CLEOCIN) IVPB 900 mg     900 mg 100 mL/hr over 30 Minutes Intravenous Every 6 hours 11/15/15 1816 11/16/15 0129   11/15/15 1100  gentamicin (GARAMYCIN) 420 mg, clindamycin (CLEOCIN) 900 mg in dextrose 5 % 100 mL IVPB  Status:  Discontinued     233 mL/hr over 30 Minutes Intravenous On call to O.R. 11/14/15 1336 11/15/15 1735        Note: Portions of this report may have been transcribed using voice recognition software. Every effort was made to ensure accuracy; however, inadvertent computerized transcription errors may be present.   Any transcriptional errors that result from this process are unintentional.     Adin Hector, M.D., F.A.C.S. Gastrointestinal and Minimally Invasive Surgery [facility] [address], [place], [place] [address] [phone] Main / Paging   11/18/2015

## 2015-11-19 ENCOUNTER — Inpatient Hospital Stay (HOSPITAL_COMMUNITY): Payer: Medicare HMO

## 2015-11-19 DIAGNOSIS — J9601 Acute respiratory failure with hypoxia: Secondary | ICD-10-CM

## 2015-11-19 DIAGNOSIS — G4733 Obstructive sleep apnea (adult) (pediatric): Secondary | ICD-10-CM

## 2015-11-19 LAB — COMPREHENSIVE METABOLIC PANEL
ALT: 47 U/L (ref 14–54)
AST: 55 U/L — ABNORMAL HIGH (ref 15–41)
Albumin: 3.5 g/dL (ref 3.5–5.0)
Alkaline Phosphatase: 57 U/L (ref 38–126)
Anion gap: 10 (ref 5–15)
BUN: 17 mg/dL (ref 6–20)
CO2: 34 mmol/L — ABNORMAL HIGH (ref 22–32)
Calcium: 9 mg/dL (ref 8.9–10.3)
Chloride: 91 mmol/L — ABNORMAL LOW (ref 101–111)
Creatinine, Ser: 1.37 mg/dL — ABNORMAL HIGH (ref 0.44–1.00)
GFR calc Af Amer: 48 mL/min — ABNORMAL LOW (ref >60)
GFR calc non Af Amer: 41 mL/min — ABNORMAL LOW (ref >60)
Glucose, Bld: 132 mg/dL — ABNORMAL HIGH (ref 65–99)
Potassium: 3.7 mmol/L (ref 3.5–5.1)
Sodium: 135 mmol/L (ref 135–145)
Total Bilirubin: 0.8 mg/dL (ref 0.3–1.2)
Total Protein: 7.6 g/dL (ref 6.5–8.1)

## 2015-11-19 LAB — CBC
HCT: 32.8 % — ABNORMAL LOW (ref 36.0–46.0)
Hemoglobin: 10.4 g/dL — ABNORMAL LOW (ref 12.0–15.0)
MCH: 24.9 pg — ABNORMAL LOW (ref 26.0–34.0)
MCHC: 31.7 g/dL (ref 30.0–36.0)
MCV: 78.5 fL (ref 78.0–100.0)
Platelets: 432 10*3/uL — ABNORMAL HIGH (ref 150–400)
RBC: 4.18 MIL/uL (ref 3.87–5.11)
RDW: 17.7 % — ABNORMAL HIGH (ref 11.5–15.5)
WBC: 12.8 10*3/uL — ABNORMAL HIGH (ref 4.0–10.5)

## 2015-11-19 LAB — PHOSPHORUS: Phosphorus: 2.9 mg/dL (ref 2.5–4.6)

## 2015-11-19 MED ORDER — MORPHINE SULFATE (PF) 2 MG/ML IV SOLN: 1.0000 mg | INTRAVENOUS | Status: DC | PRN

## 2015-11-19 MED ORDER — POLYETHYLENE GLYCOL 3350 17 G PO PACK
34.0000 g | PACK | Freq: Once | ORAL | Status: AC
  Administered 2015-11-19: 34 g via ORAL
  Filled 2015-11-19: qty 2

## 2015-11-19 MED ORDER — ALBUTEROL SULFATE (2.5 MG/3ML) 0.083% IN NEBU: 2.5000 mg | INHALATION_SOLUTION | RESPIRATORY_TRACT | Status: DC | PRN

## 2015-11-19 MED ORDER — POLYETHYLENE GLYCOL 3350 17 G PO PACK
34.0000 g | PACK | Freq: Once | ORAL | Status: AC
  Administered 2015-11-19: 34 g via ORAL

## 2015-11-19 MED ORDER — LUBIPROSTONE 24 MCG PO CAPS
24.0000 ug | ORAL_CAPSULE | Freq: Two times a day (BID) | ORAL | Status: DC
  Administered 2015-11-19 – 2015-11-20 (×2): 24 ug via ORAL
  Filled 2015-11-19 (×2): qty 1

## 2015-11-19 MED ORDER — OXYCODONE HCL 5 MG PO TABS
5.0000 mg | ORAL_TABLET | ORAL | Status: DC | PRN
  Administered 2015-11-20: 5 mg via ORAL
  Filled 2015-11-19: qty 1

## 2015-11-19 MED ORDER — ALBUTEROL SULFATE (2.5 MG/3ML) 0.083% IN NEBU: 2.5000 mg | INHALATION_SOLUTION | Freq: Four times a day (QID) | RESPIRATORY_TRACT | Status: DC

## 2015-11-19 NOTE — Progress Notes (Signed)
[address] [place], [place] [address] Phone: [phone] FAX: [phone]   CITLALY CAMPLIN [id number] 14-Sep-1955  CARE TEAM:  PCP: Ann Held, MD  Outpatient Care Team: Patient Care Team: Myer Peer, MD as PCP - General (Unknown Physician Specialty) [name], MD as Consulting Physician (Pulmonary Disease) [name], MD as Consulting Physician (General Surgery) Rush Landmark, MD as Consulting Physician (Pulmonary Disease)  Inpatient Treatment Team: Treatment Team: Attending Provider: [name], MD; Technician: Sueanne Margarita, NT; Technician: Abbe Amsterdam, NT; Technician: Florene Route, NT; Registered Nurse: Bailey Mech, RN; Occupational Therapist: Merleen Milliner, OT; Physical Therapist: Alphonzo Severance, PT  Problem List:   Principal Problem:   Incisional hernia s/p lap repair with mesh 11/15/2015 Active Problems:   GERD (gastroesophageal reflux disease)   OSA (obstructive sleep apnea)   Morbid obesity ([place])   Urinary retention   4 Days Post-Op  11/15/2015  Procedure(s): LAPAROSCOPIC VENTRAL WALL HERNIA REPAIR LAPAROSCOPIC LYSIS OF ADHESIONS INSERTION OF MESH   Assessment  Fair pain control  Plan:  -hypoxia - restart oxygen.  Sched nebs.  Check CXR - ask pulmonary to see -diuresis.  Recheck Cr - elevated yesterday but UOP OK -PO>IV pain control regimen -DM control -CPAP for OSA -VTE prophylaxis- SCDs, etc -mobilize as tolerated to help recovery -May need HH or SNF - try to mobilize more  Adin Hector, M.D., F.A.C.S. Gastrointestinal and Minimally Invasive Surgery [facility] [address], [place],  [address] [phone] Main / Paging   11/19/2015  Subjective:  Sore - PO medicines helping Sleepy Walked to RN station  Objective:  Vital signs:  Vitals:   11/18/15 2135 11/18/15 2145 11/19/15 0036 11/19/15 0522   BP:  129/69  (!) 135/92  Pulse: 70 68 84 75  Resp: 18 18 18 18   Temp:  97.7 F (36.5 C)  97.7 F (36.5 C)  TempSrc:  Axillary  Oral  SpO2: 93%  99% (!) 89%  Weight:      Height:        Last BM Date:  (PTA)  Intake/Output   Yesterday:  09/11 0701 - 09/12 0700 In: 720 [P.O.:720] Out: 900 [Urine:900] This shift:  No intake/output data recorded.  Bowel function:  Flatus: YES  BM:  No  Drain: (No drain)   Physical Exam:  General: Pt awake/alert/oriented x4 in mild acute distress Eyes: PERRL, normal EOM.  Sclera clear.  No icterus Neuro: CN II-XII intact w/o focal sensory/motor deficits. Lymph: No head/neck/groin lymphadenopathy Psych:  No delerium/psychosis/paranoia HENT: Normocephalic, Mucus membranes moist.  No thrush Neck: Supple, No tracheal deviation Chest: No chest wall pain w good excursion.  85% sat on RA.  Dec BS at bases CV:  Pulses intact.  Regular rhythm MS: Normal AROM mjr joints.  No obvious deformity Abdomen: Soft.  Nondistended.  Mioderately tender at incisions only.  No evidence of peritonitis.  No incarcerated hernias.  Suprapubic incision closed w old drainage. Ext:  SCDs BLE.  No mjr edema.  No cyanosis Skin: No petechiae / purpura  Results:   Labs: Results for orders placed or performed during the hospital encounter of 11/15/15 (from the past 48 hour(s))  Glucose, capillary     Status: Abnormal   Collection Time: 11/17/15  8:26 AM  Result Value Ref Range   Glucose-Capillary 163 (H) 65 - 99 mg/dL   Comment 1 Notify  RN   Glucose, capillary     Status: Abnormal   Collection Time: 11/17/15 12:14 PM  Result Value Ref Range   Glucose-Capillary 212 (H) 65 - 99 mg/dL   Comment 1 Notify RN   Glucose, capillary     Status: Abnormal   Collection Time: 11/17/15  4:37 PM  Result Value Ref Range   Glucose-Capillary 167 (H) 65 - 99 mg/dL  Glucose, capillary     Status: Abnormal   Collection Time: 11/17/15 10:17 PM  Result Value Ref Range    Glucose-Capillary 147 (H) 65 - 99 mg/dL  Glucose, capillary     Status: Abnormal   Collection Time: 11/18/15  7:22 AM  Result Value Ref Range   Glucose-Capillary 109 (H) 65 - 99 mg/dL  Basic metabolic panel     Status: Abnormal   Collection Time: 11/18/15  8:38 AM  Result Value Ref Range   Sodium 136 135 - 145 mmol/L   Potassium 4.1 3.5 - 5.1 mmol/L   Chloride 94 (L) 101 - 111 mmol/L   CO2 33 (H) 22 - 32 mmol/L   Glucose, Bld 167 (H) 65 - 99 mg/dL   BUN 19 6 - 20 mg/dL   Creatinine, Ser 1.45 (H) 0.44 - 1.00 mg/dL   Calcium 8.7 (L) 8.9 - 10.3 mg/dL   GFR calc non Af Amer 38 (L) >60 mL/min   GFR calc Af Amer 44 (L) >60 mL/min    Comment: (NOTE) The eGFR has been calculated using the CKD EPI equation. This calculation has not been validated in all clinical situations. eGFR's persistently <60 mL/min signify possible Chronic Kidney Disease.    Anion gap 9 5 - 15  CBC     Status: Abnormal   Collection Time: 11/18/15  8:38 AM  Result Value Ref Range   WBC 13.4 (H) 4.0 - 10.5 K/uL   RBC 3.80 (L) 3.87 - 5.11 MIL/uL   Hemoglobin 9.5 (L) 12.0 - 15.0 g/dL   HCT 30.5 (L) 36.0 - 46.0 %   MCV 80.3 78.0 - 100.0 fL   MCH 25.0 (L) 26.0 - 34.0 pg   MCHC 31.1 30.0 - 36.0 g/dL   RDW 17.8 (H) 11.5 - 15.5 %   Platelets 372 150 - 400 K/uL  Glucose, capillary     Status: Abnormal   Collection Time: 11/18/15 11:45 AM  Result Value Ref Range   Glucose-Capillary 156 (H) 65 - 99 mg/dL    Imaging / Studies: No results found.  Medications / Allergies: per chart  Antibiotics: Anti-infectives    Start     Dose/Rate Route Frequency Ordered Stop   11/15/15 2000  clindamycin (CLEOCIN) IVPB 900 mg     900 mg 100 mL/hr over 30 Minutes Intravenous Every 6 hours 11/15/15 1816 11/16/15 0129   11/15/15 1100  gentamicin (GARAMYCIN) 420 mg, clindamycin (CLEOCIN) 900 mg in dextrose 5 % 100 mL IVPB  Status:  Discontinued     233 mL/hr over 30 Minutes Intravenous On call to O.R. 11/14/15 1336 11/15/15 1735         Note: Portions of this report may have been transcribed using voice recognition software. Every effort was made to ensure accuracy; however, inadvertent computerized transcription errors may be present.   Any transcriptional errors that result from this process are unintentional.     Adin Hector, M.D., F.A.C.S. Gastrointestinal and Minimally Invasive Surgery [facility] [address] [place], [place] [address] [phone] Main /  Paging   11/19/2015

## 2015-11-19 NOTE — Consult Note (Signed)
Name: Kathleen Hatfield MRN: [id number] DOB: 1956-02-07    ADMISSION DATE:  11/15/2015 CONSULTATION DATE:  11/19/15  REFERRING MD :  Dr. Johney Maine / [facility]   CHIEF COMPLAINT:  Hypoxic Respiratory Failure   HISTORY OF PRESENT ILLNESS:  60 y/o morbidly obese F, never smoker, with PMH of anemia, arthritis, HTN, HLD, chronic diastolic CHF (on torsemide 20 mg daily), peripheral neuropathy, GERD, DM, depression, asthma (Advair BID),  OSA (on CPAP) and ulcerative colitis s/p urgent sigmoid colectomy (2010) with residual chronic periumbilical wound with drainage and hernia who was admitted 11/15/15 for planned repair of incisional hernia.   The patient underwent laparoscopic ventral wall hernia repair, lysis of adhesions and insertion of mesh per Dr. Johney Maine on 9/8.  She initially had normal work of breathing and tolerated her abdominal binder with good pain control.  Chart review shows that she initially was 96% on room air but post surgery required simple face mask that was transitioned to nasal cannula 2-3L.  She has run 90-93% on 3L since the surgery.  Patient reports she has been wearing her CPAP at night averaging 7-8 hours and during the day when in the bed.  She denies asthma symptoms - wheezing, chest tightness, cough, nocturnal symptoms.  Denies fevers, chills, n/v/d, chest pain, pain with inspiration.    PAST MEDICAL HISTORY :   has a past medical history of Anemia; Arthritis; Asthma; CHF (congestive heart failure) ([place]) (05/2014); Chronic diastolic heart failure ([place]); Depression; Diabetes mellitus without complication ([place]); Diverticulitis; Dyspnea; GERD (gastroesophageal reflux disease); History of blood transfusion; Hyperlipidemia; Hypertension; Peripheral neuropathy ([place]); Pneumonia; PONV (postoperative nausea and vomiting); Sleep apnea; and Ulcerative colitis ([place]).  has a past surgical history that includes Abdominal hysterectomy; Cholecystectomy; Cesarean section; Appendectomy; Sigmoidectomy (2010); right  heart catheterization (N/A, 02/27/2013); right heart catheterization (N/A, 12/14/2013); Cardiac catheterization; Breast surgery; Hernia repair; Ventral hernia repair (N/A, 11/15/2015); Laparoscopic lysis of adhesions (N/A, 11/15/2015); and Insertion of mesh (N/A, 11/15/2015).    Prior to Admission medications   Medication Sig Start Date End Date Taking? Authorizing Provider  acetaminophen (TYLENOL) 500 MG tablet Take 500 mg by mouth every 4 (four) hours as needed (pain).    Yes Historical Provider, MD  ADVAIR HFA 115-21 MCG/ACT inhaler INHALE 2 PUFFS INTO THE LUNGS 2 TIMES DAILY. Patient taking differently: No sig reported 10/03/15  Yes Tanda Rockers, MD  AMITIZA 24 MCG capsule Take 1 capsule by mouth 2 (two) times daily as needed. 10/22/15  Yes Historical Provider, MD  baclofen (LIORESAL) 5 mg TABS tablet Take 2.5 mg by mouth 2 (two) times daily as needed for muscle spasms.  03/27/13  Yes Dixon Boos, MD  BAYER CONTOUR NEXT TEST test strip As directed 07/30/15  Yes Historical Provider, MD  Cholecalciferol (VITAMIN D-3) 5000 UNITS TABS Take 5,000 Units by mouth daily.    Yes Historical Provider, MD  DULoxetine (CYMBALTA) 60 MG capsule Take 60 mg by mouth 2 (two) times daily.    Yes Historical Provider, MD  estradiol (ESTRACE) 2 MG tablet Take 1 tablet (2 mg total) by mouth daily. 07/29/11  Yes Delsa Bern, MD  fenofibrate (TRICOR) 145 MG tablet Take 145 mg by mouth at bedtime.  09/28/14  Yes Historical Provider, MD  folic acid (FOLVITE) 1 MG tablet Take 1 mg by mouth 2 (two) times daily.   Yes Historical Provider, MD  gabapentin (NEURONTIN) 100 MG capsule Take 300 mg by mouth at bedtime.    Yes Historical Provider, MD  Ralene Bathe  EASE INJECT PEN NEEDLES 32G X 4 MM MISC  06/19/15  Yes Historical Provider, MD  Liraglutide (VICTOZA) 18 MG/3ML SOPN Inject 1.2 mg into the skin every evening.    Yes Historical Provider, MD  LORazepam (ATIVAN) 1 MG tablet Take 1 tablet by mouth at bedtime.    Yes Historical  Provider, MD  metFORMIN (GLUCOPHAGE-XR) 750 MG 24 hr tablet Take 750 mg by mouth daily.    Yes Historical Provider, MD  MICROLET LANCETS [place] As directed 07/30/15  Yes Historical Provider, MD  mupirocin ointment (BACTROBAN) 2 % Apply 1 application topically 2 (two) times daily. For 5 days 10/22/15  Yes Historical Provider, MD  pantoprazole (PROTONIX) 40 MG tablet Take 40 mg by mouth daily.  04/14/12  Yes Historical Provider, MD  potassium chloride (K-DUR,KLOR-CON) 10 MEQ tablet Take 30 mEq by mouth daily.  03/25/15  Yes Historical Provider, MD  [place] into the lungs at bedtime. CPAP   Yes Historical Provider, MD  pseudoephedrine (SUDAFED) 120 MG 12 hr tablet Take 120 mg by mouth daily as needed for congestion.   Yes Historical Provider, MD  rOPINIRole (REQUIP) 4 MG tablet Take 4 mg by mouth 2 (two) times daily.    Yes Historical Provider, MD  rosuvastatin (CRESTOR) 10 MG tablet Take 10 mg by mouth once a week. On Saturday or Sunday 03/25/15  Yes Historical Provider, MD  torsemide (DEMADEX) 20 MG tablet TAKE 3 TABLETS DAILY Patient taking differently: Take 60 mg by mouth in the morning 12/24/14  Yes [name], MD  traZODone (DESYREL) 50 MG tablet Take 50 mg by mouth at bedtime as needed for sleep.  06/19/15  Yes Historical Provider, MD  TRESIBA FLEXTOUCH 100 UNIT/ML SOPN Inject 15 Units into the skin at bedtime. Max of 60 units/day 06/19/15  Yes Historical Provider, MD  albuterol (PROVENTIL HFA;VENTOLIN HFA) 108 (90 BASE) MCG/ACT inhaler Inhale 2 puffs into the lungs every 4 (four) hours as needed for wheezing. 11/02/14   [name], MD  albuterol (PROVENTIL) (2.5 MG/3ML) 0.083% nebulizer solution Take 3 mLs (2.5 mg total) by nebulization every 4 (four) hours as needed for wheezing. 08/26/15   Tanda Rockers, MD  Alum Hydroxide-Mag Carbonate (GAVISCON PO) Take 2 tablets by mouth at bedtime as needed (reflux).     Historical Provider, MD  traMADol (ULTRAM) 50 MG tablet Take  1.5-2 tablets (75-100 mg total) by mouth every 6 (six) hours as needed for severe pain. 11/15/15   [name], MD   Allergies  Allergen Reactions  . Almond Oil Anaphylaxis  . Ceclor [Cefaclor] Nausea And Vomiting  . Ciprofloxacin Other (See Comments)    Makes joints and muscles ache  . Levaquin [Levofloxacin] Other (See Comments)    Body aches  . Sulfa Antibiotics Nausea And Vomiting    FAMILY HISTORY:  family history includes COPD in her father; Dementia in her mother; Heart disease in her father and mother; Hypertension in her father and mother.   SOCIAL HISTORY:  reports that she has never smoked. She has never used smokeless tobacco. She reports that she does not drink alcohol or use drugs.  REVIEW OF SYSTEMS:  POSITIVES IN BOLD Constitutional: Negative for fever, chills, weight loss, malaise/fatigue and diaphoresis.  HENT: Negative for hearing loss, ear pain, nosebleeds, congestion, sore throat, neck pain, tinnitus and ear discharge.   Eyes: Negative for blurred vision, double vision, photophobia, pain, discharge and redness.  Respiratory: Negative for cough, hemoptysis, sputum production, shortness of breath, wheezing  and stridor.   Cardiovascular: Negative for chest pain, palpitations, orthopnea, claudication, leg swelling and PND.  Gastrointestinal: Negative for heartburn, nausea, vomiting, abdominal pain - "like a MAC truck drove throught my abdomen", diarrhea, constipation, blood in stool and melena.  Genitourinary: Negative for dysuria, urgency, frequency, hematuria and flank pain.  Musculoskeletal: Negative for myalgias, back pain, joint pain and falls.  Skin: Negative for itching and rash.  Neurological: Negative for dizziness, tingling, tremors, sensory change, speech change, focal weakness, seizures, loss of consciousness, weakness and headaches.  Endo/Heme/Allergies: Negative for environmental allergies and polydipsia. Does not bruise/bleed easily.  SUBJECTIVE:    VITAL SIGNS: Temp:  [97.7 F (36.5 C)-98.6 F (37 C)] 97.7 F (36.5 C) (09/12 0522) Pulse Rate:  [68-84] 75 (09/12 0522) Resp:  [18] 18 (09/12 0522) BP: (129-135)/(68-92) 135/92 (09/12 0522) SpO2:  [89 %-99 %] 89 % (09/12 0522)  PHYSICAL EXAMINATION: General: morbidly obese female in NAD Neuro:  Appears drowsy, opens eyes to voice, speech clear, oriented, follows commands, hand "flapping" noted HEENT:  MM pink/dry, unable to appreciate JVD, pupils 2-72m R Cardiovascular:  s1s2 rrr, no m/r/g  Lungs:  Shallow, non-labored, lungs bilaterally diminished but clear  Abdomen:  Obese/soft, abd binder in place, surgical dressing c/d/i, RLQ tattoo  Musculoskeletal:  No acute deformities  Skin:  Warm/dry, no edema    Recent Labs Lab 11/18/15 0838 11/19/15 0849  NA 136 135  K 4.1 3.7  CL 94* 91*  CO2 33* 34*  BUN 19 17  CREATININE 1.45* 1.37*  GLUCOSE 167* 132*    Recent Labs Lab 11/18/15 0838 11/19/15 0849  HGB 9.5* 10.4*  HCT 30.5* 32.8*  WBC 13.4* 12.8*  PLT 372 432*   Dg Chest 2 View  Result Date: 11/19/2015 CLINICAL DATA:  Hx htn. Hx asthma. Chf. Hypoxia. Weakness and sob today. EXAM: CHEST  2 VIEW COMPARISON:  11/05/2015 FINDINGS: Cardiac silhouette is mildly enlarged. No mediastinal or hilar masses or evidence of adenopathy. Lungs are mildly hyperexpanded. There is a small, approximate 8 mm, focal opacity at the right apex. This could reflect a true nodule or superimposed scarring and bronchovascular structures. No other evidence of a nodule. There is bronchial wall thickening in the lower lungs with mild central interstitial thickening more diffusely. These findings appear chronic. There is no evidence of pneumonia or pulmonary edema. No pleural effusion or pneumothorax. Skeletal structures are intact. IMPRESSION: 1. No acute cardiopulmonary disease. 2. Chronic bronchitic changes in the lung bases. Lungs are mildly hyperexpanded. 3. Mild stable cardiomegaly. 4. Possible 8  mm pulmonary nodule in the right apex. Recommend follow-up chest CT without contrast for further assessment. Electronically Signed   By: DLajean ManesM.D.   On: 11/19/2015 10:13       SIGNIFICANT EVENTS  9/08  Admit for incisional hernia repair 9/12  PCCM consulted for acute hypoxic respiratory failure   STUDIES:    ASSESSMENT / PLAN:  1.  Acute Hypoxic Respiratory Failure - suspect multifactorial in the setting of OSA with decompensation related to post-op narcotics, post-operative immobility / atelectasis and sedating medications (65 mg of oxy IR in last 48 hours, dilaudid prior to that 150m  2.  Obstructive Sleep Apnea - baseline CPAP, ABG 10/22/15 > 7.438 / 41 / 82 /27   3.  Asthma - without exacerbation  4.  Possible 8 mm Pulmonary Nodule in R Apex - noted on CXR 9/12  5.  Chronic Diastolic CHF   Plan: Continue CPAP QHS and PRN during  day Reduce narcotics > Oxy IR to 90m Q4 PRN, morphine to 1-2 mg PRN O2 as needed to support sats >90% Incentive spirometry > pt self reports she has not been doing it Stressed importance of PT efforts with patient OOB to chair BID  Ambulate BID-TID daily  Intermittent CXR Follow up pulmonary nodule in clinic with repeat xray   BNoe Gens NP-C [facility] Pgr: [phone] or if no answer [phone] 11/19/2015, 11:03 AM  Attending note: I have seen and examined the patient with nurse practitioner/resident and agree with the note. History, labs and imaging reviewed.  60Y/O with obesity, asthma, OSA on CPAP, CHF admitted for elective repair of incisional hernia. PCCM consulted for post op hypoxemia. She feels well with no resp distress. On O2 2-3 LPM via nasal cannula. She continues to use the CPAP without issues for her sleep apnea.  -Advise to minimize narcotic pain med use , ambulate and get out of bed -Incentive spirometer -Continue CPAP at night. -Follow up pulmonary nodule.  PMarshell GarfinkelMD [facility] Pager [phone] no answer or after 3pm call: [phone] 11/19/2015, 1:44 PM

## 2015-11-19 NOTE — Progress Notes (Addendum)
Discharge planning, spoke with patient and spouse at beside. Scheryl Darter for [facility] services, contacted [facility] for referral. Will d/c to [address] Letta Kocher, [phone], phone [phone]. May needs home O2, had O2 through [facility] in the past. Awaiting final orders.  [phone]

## 2015-11-19 NOTE — Progress Notes (Signed)
[address] [place], [place] [address] Phone: [phone] FAX: [phone]   Kathleen Hatfield [id number] 1955/06/21  CARE TEAM:  PCP: Ann Held, MD  Outpatient Care Team: Patient Care Team: Myer Peer, MD as PCP - General (Unknown Physician Specialty) [name], MD as Consulting Physician (Pulmonary Disease) [name], MD as Consulting Physician (General Surgery) Rush Landmark, MD as Consulting Physician (Pulmonary Disease)  Inpatient Treatment Team: Treatment Team: Attending Provider: [name], MD; Technician: Sueanne Margarita, NT; Technician: Abbe Amsterdam, NT; Technician: Florene Route, NT; Registered Nurse: Bailey Mech, RN; Occupational Therapist: Merleen Milliner, OT; Physical Therapist: Alphonzo Severance, PT  Problem List:   Principal Problem:   Incisional hernia s/p lap repair with mesh 11/15/2015 Active Problems:   GERD (gastroesophageal reflux disease)   OSA (obstructive sleep apnea)   Morbid obesity ([place])   Urinary retention   4 Days Post-Op  11/15/2015  Procedure(s): LAPAROSCOPIC VENTRAL WALL HERNIA REPAIR LAPAROSCOPIC LYSIS OF ADHESIONS INSERTION OF MESH   Assessment  Fair pain control  Plan:  -switch pain control regimen -DM control -CPAP for OSA -try to wean oxygen -diuresis -retry removing foley -VTE prophylaxis- SCDs, etc -mobilize as tolerated to help recovery -May need HH or SNF - try to mobilize more  Adin Hector, M.D., F.A.C.S. Gastrointestinal and Minimally Invasive Surgery [facility] [address] [place], [place] [address] [phone] Main / Paging   11/19/2015  Subjective:  Sore Sleepy Walked to RN station  Objective:  Vital signs:  Vitals:   11/18/15 2135 11/18/15 2145 11/19/15 0036 11/19/15 0522  BP:  129/69  (!) 135/92  Pulse: 70 68 84 75  Resp: 18 18 18 18   Temp:  97.7 F (36.5 C)  97.7  F (36.5 C)  TempSrc:  Axillary  Oral  SpO2: 93%  99% (!) 89%  Weight:      Height:        Last BM Date:  (PTA)  Intake/Output   Yesterday:  09/11 0701 - 09/12 0700 In: 720 [P.O.:720] Out: 900 [Urine:900] This shift:  No intake/output data recorded.  Bowel function:  Flatus: YES  BM:  No  Drain: (No drain)   Physical Exam:  General: Pt awake/alert/oriented x4 in mild acute distress Eyes: PERRL, normal EOM.  Sclera clear.  No icterus Neuro: CN II-XII intact w/o focal sensory/motor deficits. Lymph: No head/neck/groin lymphadenopathy Psych:  No delerium/psychosis/paranoia HENT: Normocephalic, Mucus membranes moist.  No thrush Neck: Supple, No tracheal deviation Chest: No chest wall pain w good excursion.  Oxygen in place CV:  Pulses intact.  Regular rhythm MS: Normal AROM mjr joints.  No obvious deformity Abdomen: Soft.  Nondistended.  Mioderately tender at incisions only.  No evidence of peritonitis.  No incarcerated hernias. Ext:  SCDs BLE.  No mjr edema.  No cyanosis Skin: No petechiae / purpura  Results:   Labs: Results for orders placed or performed during the hospital encounter of 11/15/15 (from the past 48 hour(s))  Glucose, capillary     Status: Abnormal   Collection Time: 11/17/15  8:26 AM  Result Value Ref Range   Glucose-Capillary 163 (H) 65 - 99 mg/dL   Comment 1 Notify RN   Glucose, capillary     Status: Abnormal   Collection Time: 11/17/15 12:14 PM  Result Value Ref Range   Glucose-Capillary 212 (H) 65 - 99 mg/dL  Comment 1 Notify RN   Glucose, capillary     Status: Abnormal   Collection Time: 11/17/15  4:37 PM  Result Value Ref Range   Glucose-Capillary 167 (H) 65 - 99 mg/dL  Glucose, capillary     Status: Abnormal   Collection Time: 11/17/15 10:17 PM  Result Value Ref Range   Glucose-Capillary 147 (H) 65 - 99 mg/dL  Glucose, capillary     Status: Abnormal   Collection Time: 11/18/15  7:22 AM  Result Value Ref Range   Glucose-Capillary  109 (H) 65 - 99 mg/dL  Basic metabolic panel     Status: Abnormal   Collection Time: 11/18/15  8:38 AM  Result Value Ref Range   Sodium 136 135 - 145 mmol/L   Potassium 4.1 3.5 - 5.1 mmol/L   Chloride 94 (L) 101 - 111 mmol/L   CO2 33 (H) 22 - 32 mmol/L   Glucose, Bld 167 (H) 65 - 99 mg/dL   BUN 19 6 - 20 mg/dL   Creatinine, Ser 1.45 (H) 0.44 - 1.00 mg/dL   Calcium 8.7 (L) 8.9 - 10.3 mg/dL   GFR calc non Af Amer 38 (L) >60 mL/min   GFR calc Af Amer 44 (L) >60 mL/min    Comment: (NOTE) The eGFR has been calculated using the CKD EPI equation. This calculation has not been validated in all clinical situations. eGFR's persistently <60 mL/min signify possible Chronic Kidney Disease.    Anion gap 9 5 - 15  CBC     Status: Abnormal   Collection Time: 11/18/15  8:38 AM  Result Value Ref Range   WBC 13.4 (H) 4.0 - 10.5 K/uL   RBC 3.80 (L) 3.87 - 5.11 MIL/uL   Hemoglobin 9.5 (L) 12.0 - 15.0 g/dL   HCT 30.5 (L) 36.0 - 46.0 %   MCV 80.3 78.0 - 100.0 fL   MCH 25.0 (L) 26.0 - 34.0 pg   MCHC 31.1 30.0 - 36.0 g/dL   RDW 17.8 (H) 11.5 - 15.5 %   Platelets 372 150 - 400 K/uL  Glucose, capillary     Status: Abnormal   Collection Time: 11/18/15 11:45 AM  Result Value Ref Range   Glucose-Capillary 156 (H) 65 - 99 mg/dL    Imaging / Studies: No results found.  Medications / Allergies: per chart  Antibiotics: Anti-infectives    Start     Dose/Rate Route Frequency Ordered Stop   11/15/15 2000  clindamycin (CLEOCIN) IVPB 900 mg     900 mg 100 mL/hr over 30 Minutes Intravenous Every 6 hours 11/15/15 1816 11/16/15 0129   11/15/15 1100  gentamicin (GARAMYCIN) 420 mg, clindamycin (CLEOCIN) 900 mg in dextrose 5 % 100 mL IVPB  Status:  Discontinued     233 mL/hr over 30 Minutes Intravenous On call to O.R. 11/14/15 1336 11/15/15 1735        Note: Portions of this report may have been transcribed using voice recognition software. Every effort was made to ensure accuracy; however, inadvertent  computerized transcription errors may be present.   Any transcriptional errors that result from this process are unintentional.     Adin Hector, M.D., F.A.C.S. Gastrointestinal and Minimally Invasive Surgery [facility] [address] [place], [place] [address] [phone] Main / Paging   11/19/2015

## 2015-11-19 NOTE — Progress Notes (Signed)
Occupational Therapy Evaluation Patient Details Name: Kathleen Hatfield MRN: [id number] DOB: 1955-09-22 Today's Date: 11/19/2015    History of Present Illness 60 yo female s/p lap hernia repair 11/15/15. hx of obesity, dm, chf, copd, back pain.   Clinical Impression   Patient presents to OT with decreased ADL independence and safety due to the deficits listed below. She will benefit from skilled OT to maximize function and to facilitate a safe discharge. OT will follow.    Follow Up Recommendations  No OT follow up;Supervision/Assistance - 24 hour    Equipment Recommendations  None recommended by OT    Recommendations for Other Services       Precautions / Restrictions Precautions Precautions: Fall Precaution Comments: monitor sats. Gets dizzy sometimes Restrictions Weight Bearing Restrictions: No      Mobility Bed Mobility Overal bed mobility: Needs Assistance Bed Mobility: Sit to Supine       Sit to supine: Min assist   General bed mobility comments: Assist for LEs onto bed. Increased time  Transfers Overall transfer level: Needs assistance Equipment used: Rolling walker (2 wheeled) Transfers: Sit to/from Stand Sit to Stand: Min guard         General transfer comment: close guard for safety.     Balance Overall balance assessment: Needs assistance         Standing balance support: Bilateral upper extremity supported Standing balance-Leahy Scale: Poor                              ADL Overall ADL's : Needs assistance/impaired Eating/Feeding: Independent;Sitting   Grooming: Supervision/safety;Wash/dry hands;Standing       Lower Body Bathing: Maximal assistance;Sit to/from stand       Lower Body Dressing: Maximal assistance;Sit to/from stand   Toilet Transfer: Min guard;Ambulation;Comfort height toilet;RW   Toileting- Water quality scientist and Hygiene: Min guard;Sit to/from stand       Functional mobility during ADLs: Min  guard;Rolling walker General ADL Comments: Patient interested in learning about AE for LB self-care. Boyfriend willing to assist with LB self-care too.     Vision     Perception     Praxis      Pertinent Vitals/Pain Pain Assessment: 0-10 Pain Score: 6  Pain Location: abdomen Pain Descriptors / Indicators: Guarding;Operative site guarding;Sore Pain Intervention(s): Limited activity within patient's tolerance     Hand Dominance     Extremity/Trunk Assessment Upper Extremity Assessment Upper Extremity Assessment: Overall WFL for tasks assessed   Lower Extremity Assessment Lower Extremity Assessment: Defer to PT evaluation   Cervical / Trunk Assessment Cervical / Trunk Assessment: Normal   Communication Communication Communication: No difficulties   Cognition Arousal/Alertness: Awake/alert Behavior During Therapy: WFL for tasks assessed/performed Overall Cognitive Status: Within Functional Limits for tasks assessed                     General Comments       Exercises       Shoulder Instructions      Home Living Family/patient expects to be discharged to:: Private residence Living Arrangements: Other relatives Available Help at Discharge: Friend(s) Type of Home: House Home Access: Stairs to enter Technical brewer of Steps: 1 Entrance Stairs-Rails: None Home Layout: One level     Bathroom Shower/Tub: Teacher,  years/pre: Standard     Home Equipment: None          Prior Functioning/Environment Level of Independence: Independent  OT Diagnosis: Generalized weakness   OT Problem List: Decreased strength;Decreased range of motion;Decreased activity tolerance;Decreased knowledge of use of DME or AE;Cardiopulmonary status limiting activity;Pain   OT Treatment/Interventions: Self-care/ADL training;DME and/or AE instruction;Therapeutic activities;Patient/family education    OT Goals(Current goals can be found in  the care plan section) Acute Rehab OT Goals Patient Stated Goal: less pain.  OT Goal Formulation: With patient Time For Goal Achievement: 12/03/15 Potential to Achieve Goals: Good ADL Goals Pt Will Perform Lower Body Bathing: with supervision;with adaptive equipment;sit to/from stand Pt Will Perform Lower Body Dressing: with supervision;with adaptive equipment;sit to/from stand Pt Will Transfer to Toilet: with supervision;ambulating;regular height toilet Pt Will Perform Toileting - Clothing Manipulation and hygiene: with supervision;sit to/from stand Pt Will Perform Tub/Shower Transfer: Shower transfer;with supervision;ambulating;rolling walker  OT Frequency: Min 2X/week   Barriers to D/C:            Co-evaluation              End of Session Equipment Utilized During Treatment: Rolling walker;Oxygen Nurse Communication: Mobility status  Activity Tolerance: Patient tolerated treatment well Patient left: in bed;with call bell/phone within reach;with family/visitor present   Time: 2241-1464 OT Time Calculation (min): 28 min Charges:  OT General Charges $OT Visit: 1 Procedure OT Evaluation $OT Eval Low Complexity: 1 Procedure G-Codes:    Kathleen Hatfield A Nov 27, 2015, 2:41 PM

## 2015-11-19 NOTE — Evaluation (Signed)
Physical Therapy Evaluation Patient Details Name: Kathleen Hatfield MRN: [id number] DOB: 15-Jun-1955 Today's Date: 11/19/2015   History of Present Illness  60 yo female s/p lap hernia repair 11/15/15. hx of obesity, dm, chf, copd, back pain.  Clinical Impression  On eval, pt required Min assist for mobility. She walked ~175 feet with a RW. O2 sats 88% on RA during ambulation, 97% 2L O2 during ambulation. Recommend daily ambulation with nursing supervision/assist for safety.     Follow Up Recommendations Supervision/Assistance - 24 hour    Equipment Recommendations  Rolling walker with 5" wheels    Recommendations for Other Services OT consult     Precautions / Restrictions Precautions Precautions: Fall Precaution Comments: monitor sats. Gets dizzy sometimes Restrictions Weight Bearing Restrictions: No      Mobility  Bed Mobility Overal bed mobility: Needs Assistance Bed Mobility: Sit to Supine       Sit to supine: Min assist   General bed mobility comments: Assist for LEs onto bed. Increased time  Transfers Overall transfer level: Needs assistance Equipment used: Rolling walker (2 wheeled) Transfers: Sit to/from Stand Sit to Stand: Min guard         General transfer comment: close guard for safety.   Ambulation/Gait Ambulation/Gait assistance: Min guard Ambulation Distance (Feet): 175 Feet Assistive device: Rolling walker (2 wheeled) Gait Pattern/deviations: Step-through pattern;Decreased stride length     General Gait Details: slow gait speed. close guarding especially towards end of distance-pt c/o dizziness. O2 sats 88% on RA during ambulation  Stairs            Wheelchair Mobility    Modified Rankin (Stroke Patients Only)       Balance Overall balance assessment: Needs assistance         Standing balance support: Bilateral upper extremity supported Standing balance-Leahy Scale: Poor                               Pertinent  Vitals/Pain Pain Assessment: 0-10 Pain Score: 6  Pain Location: abdomen Pain Descriptors / Indicators: Guarding;Sore;Operative site guarding Pain Intervention(s): Limited activity within patient's tolerance    Home Living Family/patient expects to be discharged to:: Private residence Living Arrangements: Other relatives Available Help at Discharge: Friend(s) Type of Home: House Home Access: Stairs to enter Entrance Stairs-Rails: None Technical brewer of Steps: 1 Home Layout: One level Home Equipment: None      Prior Function                 Hand Dominance        Extremity/Trunk Assessment   Upper Extremity Assessment: Defer to OT evaluation           Lower Extremity Assessment: Generalized weakness      Cervical / Trunk Assessment: Normal  Communication      Cognition Arousal/Alertness: Awake/alert Behavior During Therapy: WFL for tasks assessed/performed Overall Cognitive Status: Within Functional Limits for tasks assessed                      General Comments      Exercises        Assessment/Plan    PT Assessment Patient needs continued PT services  PT Diagnosis Difficulty walking;Acute pain;Generalized weakness   PT Problem List Decreased mobility;Pain;Decreased activity tolerance;Decreased balance;Decreased knowledge of use of DME;Obesity;Decreased strength  PT Treatment Interventions DME instruction;Gait training;Functional mobility training;Therapeutic activities;Therapeutic exercise;Balance training;Patient/family education   PT Goals (Current  goals can be found in the Care Plan section) Acute Rehab PT Goals Patient Stated Goal: less pain.  PT Goal Formulation: With patient Time For Goal Achievement: 12/03/15 Potential to Achieve Goals: Good    Frequency Min 3X/week   Barriers to discharge        Co-evaluation               End of Session Equipment Utilized During Treatment: Oxygen Activity Tolerance:  Patient limited by fatigue;Patient limited by pain Patient left: with call bell/phone within reach;in bed;with family/visitor present           Time: 3009-2330 PT Time Calculation (min) (ACUTE ONLY): 28 min   Charges:   PT Evaluation $PT Eval Low Complexity: 1 Procedure     PT G Codes:        Weston Anna, MPT Pager: [phone]

## 2015-11-19 NOTE — Progress Notes (Signed)
Pt has not had Bm since miralax given this am. Patient refuses repeat miralax. States will consider repeating it at supper, but "feels like she is going to be able to go."

## 2015-11-20 DIAGNOSIS — R911 Solitary pulmonary nodule: Secondary | ICD-10-CM | POA: Diagnosis present

## 2015-11-20 LAB — CREATININE, SERUM
Creatinine, Ser: 1.38 mg/dL — ABNORMAL HIGH (ref 0.44–1.00)
GFR calc Af Amer: 47 mL/min — ABNORMAL LOW (ref >60)
GFR calc non Af Amer: 41 mL/min — ABNORMAL LOW (ref >60)

## 2015-11-20 LAB — POTASSIUM: Potassium: 3.6 mmol/L (ref 3.5–5.1)

## 2015-11-20 NOTE — Progress Notes (Cosign Needed)
Patient has been discharged.  She is off O2 and is much more alert this morning.  She has planned follow up arranged with Dr. Corrie Dandy on 12/11/15.     Noe Gens, NP-C [facility] Pgr: [phone] or if no answer [phone] 11/20/2015, 10:43 AM

## 2015-11-20 NOTE — Discharge Summary (Signed)
Physician Discharge Summary  Patient ID: Kathleen Hatfield MRN: [id number] DOB/AGE: 1955-03-23 60 y.o.  Admit date: 11/15/2015 Discharge date: 11/20/2015  Patient Care Team: Myer Peer, MD as PCP - General (Unknown Physician Specialty) [name], MD as Consulting Physician (Pulmonary Disease) [name], MD as Consulting Physician (General Surgery) Rush Landmark, MD as Consulting Physician (Pulmonary Disease)  Admission Diagnoses: Principal Problem:   Incisional hernia s/p lap repair with mesh 11/15/2015 Active Problems:   GERD (gastroesophageal reflux disease)   OSA (obstructive sleep apnea)   Morbid obesity ([place])   Urinary retention   Hypoxemia   Solitary pulmonary nodule   Discharge Diagnoses:  Principal Problem:   Incisional hernia s/p lap repair with mesh 11/15/2015 Active Problems:   GERD (gastroesophageal reflux disease)   OSA (obstructive sleep apnea)   Morbid obesity ([place])   Urinary retention   Hypoxemia   Solitary pulmonary nodule   POST-OPERATIVE DIAGNOSIS:   Hernia in abdomen  SURGERY:  11/15/2015  Procedure(s): LAPAROSCOPIC VENTRAL WALL HERNIA REPAIR LAPAROSCOPIC LYSIS OF ADHESIONS INSERTION OF MESH  SURGEON:    Surgeon(s): [name], MD  Consults: pulmonary/intensive care  PT OT  Hospital Course:   The patient underwent the surgery above.  Postoperatively, the patient struggled with sedation and hypoxia. Pain regimen adjusted, diuresed, weaned off O2. Pulmonary consulted. Gradually mobilized and advanced to a solid diet.  Pain and other symptoms were treated aggressively.    By the time of discharge, the patient was walking well the hallways, eating food, having flatus, had bowel movements.  Pain was well-controlled on an oral medications.  Based on meeting discharge criteria and continuing to recover, I felt it was safe for the patient to be discharged from the hospital to further recover with close followup. Postoperative  recommendations were discussed in detail.  They are written as well.   Significant Diagnostic Studies:  Results for orders placed or performed during the hospital encounter of 11/15/15 (from the past 72 hour(s))  Glucose, capillary     Status: Abnormal   Collection Time: 11/17/15 12:14 PM  Result Value Ref Range   Glucose-Capillary 212 (H) 65 - 99 mg/dL   Comment 1 Notify RN   Glucose, capillary     Status: Abnormal   Collection Time: 11/17/15  4:37 PM  Result Value Ref Range   Glucose-Capillary 167 (H) 65 - 99 mg/dL  Glucose, capillary     Status: Abnormal   Collection Time: 11/17/15 10:17 PM  Result Value Ref Range   Glucose-Capillary 147 (H) 65 - 99 mg/dL  Glucose, capillary     Status: Abnormal   Collection Time: 11/18/15  7:22 AM  Result Value Ref Range   Glucose-Capillary 109 (H) 65 - 99 mg/dL  Basic metabolic panel     Status: Abnormal   Collection Time: 11/18/15  8:38 AM  Result Value Ref Range   Sodium 136 135 - 145 mmol/L   Potassium 4.1 3.5 - 5.1 mmol/L   Chloride 94 (L) 101 - 111 mmol/L   CO2 33 (H) 22 - 32 mmol/L   Glucose, Bld 167 (H) 65 - 99 mg/dL   BUN 19 6 - 20 mg/dL   Creatinine, Ser 1.45 (H) 0.44 - 1.00 mg/dL   Calcium 8.7 (L) 8.9 - 10.3 mg/dL   GFR calc non Af Amer 38 (L) >60 mL/min   GFR calc Af Amer 44 (L) >60 mL/min    Comment: (NOTE) The eGFR has been calculated using the CKD  EPI equation. This calculation has not been validated in all clinical situations. eGFR's persistently <60 mL/min signify possible Chronic Kidney Disease.    Anion gap 9 5 - 15  CBC     Status: Abnormal   Collection Time: 11/18/15  8:38 AM  Result Value Ref Range   WBC 13.4 (H) 4.0 - 10.5 K/uL   RBC 3.80 (L) 3.87 - 5.11 MIL/uL   Hemoglobin 9.5 (L) 12.0 - 15.0 g/dL   HCT 30.5 (L) 36.0 - 46.0 %   MCV 80.3 78.0 - 100.0 fL   MCH 25.0 (L) 26.0 - 34.0 pg   MCHC 31.1 30.0 - 36.0 g/dL   RDW 17.8 (H) 11.5 - 15.5 %   Platelets 372 150 - 400 K/uL  Glucose, capillary     Status:  Abnormal   Collection Time: 11/18/15 11:45 AM  Result Value Ref Range   Glucose-Capillary 156 (H) 65 - 99 mg/dL  Phosphorus     Status: None   Collection Time: 11/19/15  8:49 AM  Result Value Ref Range   Phosphorus 2.9 2.5 - 4.6 mg/dL  Comprehensive metabolic panel     Status: Abnormal   Collection Time: 11/19/15  8:49 AM  Result Value Ref Range   Sodium 135 135 - 145 mmol/L   Potassium 3.7 3.5 - 5.1 mmol/L   Chloride 91 (L) 101 - 111 mmol/L   CO2 34 (H) 22 - 32 mmol/L   Glucose, Bld 132 (H) 65 - 99 mg/dL   BUN 17 6 - 20 mg/dL   Creatinine, Ser 1.37 (H) 0.44 - 1.00 mg/dL   Calcium 9.0 8.9 - 10.3 mg/dL   Total Protein 7.6 6.5 - 8.1 g/dL   Albumin 3.5 3.5 - 5.0 g/dL   AST 55 (H) 15 - 41 U/L   ALT 47 14 - 54 U/L   Alkaline Phosphatase 57 38 - 126 U/L   Total Bilirubin 0.8 0.3 - 1.2 mg/dL   GFR calc non Af Amer 41 (L) >60 mL/min   GFR calc Af Amer 48 (L) >60 mL/min    Comment: (NOTE) The eGFR has been calculated using the CKD EPI equation. This calculation has not been validated in all clinical situations. eGFR's persistently <60 mL/min signify possible Chronic Kidney Disease.    Anion gap 10 5 - 15  CBC     Status: Abnormal   Collection Time: 11/19/15  8:49 AM  Result Value Ref Range   WBC 12.8 (H) 4.0 - 10.5 K/uL   RBC 4.18 3.87 - 5.11 MIL/uL   Hemoglobin 10.4 (L) 12.0 - 15.0 g/dL   HCT 32.8 (L) 36.0 - 46.0 %   MCV 78.5 78.0 - 100.0 fL   MCH 24.9 (L) 26.0 - 34.0 pg   MCHC 31.7 30.0 - 36.0 g/dL   RDW 17.7 (H) 11.5 - 15.5 %   Platelets 432 (H) 150 - 400 K/uL  Potassium     Status: None   Collection Time: 11/20/15  4:55 AM  Result Value Ref Range   Potassium 3.6 3.5 - 5.1 mmol/L  Creatinine, serum     Status: Abnormal   Collection Time: 11/20/15  4:55 AM  Result Value Ref Range   Creatinine, Ser 1.38 (H) 0.44 - 1.00 mg/dL   GFR calc non Af Amer 41 (L) >60 mL/min   GFR calc Af Amer 47 (L) >60 mL/min    Comment: (NOTE) The eGFR has been calculated using the CKD EPI  equation. This calculation has not been  validated in all clinical situations. eGFR's persistently <60 mL/min signify possible Chronic Kidney Disease.     Dg Chest 2 View  Result Date: 11/19/2015 CLINICAL DATA:  Hx htn. Hx asthma. Chf. Hypoxia. Weakness and sob today. EXAM: CHEST  2 VIEW COMPARISON:  11/05/2015 FINDINGS: Cardiac silhouette is mildly enlarged. No mediastinal or hilar masses or evidence of adenopathy. Lungs are mildly hyperexpanded. There is a small, approximate 8 mm, focal opacity at the right apex. This could reflect a true nodule or superimposed scarring and bronchovascular structures. No other evidence of a nodule. There is bronchial wall thickening in the lower lungs with mild central interstitial thickening more diffusely. These findings appear chronic. There is no evidence of pneumonia or pulmonary edema. No pleural effusion or pneumothorax. Skeletal structures are intact. IMPRESSION: 1. No acute cardiopulmonary disease. 2. Chronic bronchitic changes in the lung bases. Lungs are mildly hyperexpanded. 3. Mild stable cardiomegaly. 4. Possible 8 mm pulmonary nodule in the right apex. Recommend follow-up chest CT without contrast for further assessment. Electronically Signed   By: Lajean Manes M.D.   On: 11/19/2015 10:13    Discharge Exam: Blood pressure (!) 115/52, pulse 77, temperature 99.3 F (37.4 C), temperature source Oral, resp. rate 18, height _0  (1.651 m), weight 265 lb (120.2 kg), SpO2 92 %.  General: Pt awake/alert/oriented x4 in no major acute distress Eyes: PERRL, normal EOM. Sclera nonicteric Neuro: CN II-XII intact w/o focal sensory/motor deficits. Lymph: No head/neck/groin lymphadenopathy Psych:  No delerium/psychosis/paranoia HENT: Normocephalic, Mucus membranes moist.  No thrush Neck: Supple, No tracheal deviation Chest: No pain.  Good respiratory excursion. CV:  Pulses intact. Regular rhythm MS: Normal AROM mjr joints.  No obvious deformity Abdomen:  Morbidly obese but soft, nondistended. No incisional drainage. Mildly tender at incisional sites. No incarcerated hernias. Ext:  SCDs BLE.  No significant edema.  No cyanosis Skin: No petechiae / purpura  Discharged Condition: good   Past Medical History:  Diagnosis Date  . Anemia   . Arthritis   . Asthma   . CHF (congestive heart failure) ([place]) 05/2014  . Chronic diastolic heart failure ([place])   . Depression   . Diabetes mellitus without complication ([place])   . Diverticulitis   . Dyspnea    with exertion  . GERD (gastroesophageal reflux disease)   . History of blood transfusion   . Hyperlipidemia    cannot tolerate statins  . Hypertension   . Peripheral neuropathy ([place])   . Pneumonia   . PONV (postoperative nausea and vomiting)   . Sleep apnea    uses CPAP  . Ulcerative colitis ([place])    dr Collene Mares    Past Surgical History:  Procedure Laterality Date  . ABDOMINAL HYSTERECTOMY    . APPENDECTOMY    . BREAST SURGERY     left biopsy  . CARDIAC CATHETERIZATION    . CESAREAN SECTION    . CHOLECYSTECTOMY    . HERNIA REPAIR    . INSERTION OF MESH N/A 11/15/2015   Procedure: INSERTION OF MESH;  Surgeon: [name], MD;  Location: [facility];  Service: General;  Laterality: N/A;  . LAPAROSCOPIC LYSIS OF ADHESIONS N/A 11/15/2015   Procedure: LAPAROSCOPIC LYSIS OF ADHESIONS;  Surgeon: [name], MD;  Location: [facility];  Service: General;  Laterality: N/A;  . RIGHT HEART CATHETERIZATION N/A 02/27/2013   Procedure: RIGHT HEART CATH;  Surgeon: Blane Ohara, MD;  Location: [facility] CATH LAB;  Service: Cardiovascular;  Laterality: N/A;  . RIGHT HEART  CATHETERIZATION N/A 12/14/2013   Procedure: RIGHT HEART CATH;  Surgeon: Larey Dresser, MD;  [facility];  Service: Cardiovascular;  Laterality: N/A;  . SIGMOIDECTOMY  2010   diverticular disease  . VENTRAL HERNIA REPAIR N/A 11/15/2015   Procedure: LAPAROSCOPIC VENTRAL WALL HERNIA REPAIR;  Surgeon: [name], MD;  Location: [facility];   Service: General;  Laterality: N/A;    Social History   Social History  . Marital status: Single    Spouse name: N/A  . Number of children: N/A  . Years of education: N/A   Occupational History  . unemployed    Social History Main Topics  . Smoking status: Never Smoker  . Smokeless tobacco: Never Used  . Alcohol use No  . Drug use: No  . Sexual activity: Not on file   Other Topics Concern  . Not on file   Social History Narrative  . No narrative on file    Family History  Problem Relation Age of Onset  . Heart disease Mother   . Hypertension Mother   . Dementia Mother   . Heart disease Father   . COPD Father   . Hypertension Father     Current Facility-Administered Medications  Medication Dose Route Frequency Provider Last Rate Last Dose  . 0.9 %  sodium chloride infusion  250 mL Intravenous PRN [name], MD      . acetaminophen (TYLENOL) tablet 1,000 mg  1,000 mg Oral Q8H [name], MD   1,000 mg at 11/20/15 0526  . albuterol (PROVENTIL) (2.5 MG/3ML) 0.083% nebulizer solution 2.5 mg  2.5 mg Nebulization Q4H PRN [name], MD      . alum & mag hydroxide-simeth (MAALOX/MYLANTA) 200-200-20 MG/5ML suspension 30 mL  30 mL Oral Q6H PRN [name], MD   30 mL at 11/17/15 1034  . baclofen (LIORESAL) tablet 5 mg  5 mg Oral TID [name], MD   5 mg at 11/19/15 2227  . bisacodyl (DULCOLAX) suppository 10 mg  10 mg Rectal Q12H PRN [name], MD      . cholecalciferol (VITAMIN D) tablet 5,000 Units  5,000 Units Oral Daily [name], MD   5,000 Units at 11/19/15 1057  . DULoxetine (CYMBALTA) DR capsule 60 mg  60 mg Oral BID [name], MD   60 mg at 11/19/15 2227  . enoxaparin (LOVENOX) injection 40 mg  40 mg Subcutaneous Q24H [name], MD   40 mg at 11/19/15 0803  . estradiol (ESTRACE) tablet 2 mg  2 mg Oral Daily [name], MD   2 mg at 11/19/15 1116  . fenofibrate tablet 160 mg  160 mg Oral Daily [name], MD   160 mg at 11/19/15 1958  .  folic acid (FOLVITE) tablet 1 mg  1 mg Oral BID [name], MD   1 mg at 11/19/15 2227  . gabapentin (NEURONTIN) capsule 300 mg  300 mg Oral QHS [name], MD   300 mg at 11/19/15 2227  . hydrALAZINE (APRESOLINE) injection 10 mg  10 mg Intravenous Q2H PRN [name], MD      . insulin aspart (novoLOG) injection 0-15 Units  0-15 Units Subcutaneous TID WC [name], MD   2 Units at 11/19/15 1716  . insulin aspart (novoLOG) injection 0-5 Units  0-5 Units Subcutaneous QHS [name], MD      . insulin glargine (LANTUS) injection 15 Units  15 Units Subcutaneous QHS [name], MD   15 Units at 11/19/15  2224  . lactated ringers infusion   Intravenous Continuous [name], MD      . lip balm (CARMEX) ointment 1 application  1 application Topical BID [name], MD   1 application at 38/10/17 2228  . Liraglutide SOPN 1.2 mg  1.2 mg Subcutaneous QPM [name], MD      . LORazepam (ATIVAN) tablet 0.5 mg  0.5 mg Oral QHS [name], MD   0.5 mg at 11/18/15 2217  . lubiprostone (AMITIZA) capsule 24 mcg  24 mcg Oral BID WC [name], MD   24 mcg at 11/19/15 1718  . magic mouthwash  15 mL Oral QID PRN [name], MD      . menthol-cetylpyridinium (CEPACOL) lozenge 3 mg  1 lozenge Oral PRN [name], MD      . metFORMIN (GLUCOPHAGE-XR) 24 hr tablet 750 mg  750 mg Oral QPM [name], MD   750 mg at 11/19/15 1717  . metoCLOPramide (REGLAN) injection 5-10 mg  5-10 mg Intravenous Q6H PRN [name], MD      . morphine 2 MG/ML injection 1-2 mg  1-2 mg Intravenous Q2H PRN Donita Brooks, NP      . mupirocin ointment (BACTROBAN) 2 % 1 application  1 application Topical BID [name], MD   1 application at 51/02/58 2228  . ondansetron (ZOFRAN-ODT) disintegrating tablet 4-8 mg  4-8 mg Oral Q6H PRN [name], MD       Or  . ondansetron [facility]) injection 4-8 mg  4-8 mg Intravenous Q6H PRN [name], MD      . oxyCODONE (Oxy IR/ROXICODONE) immediate release tablet 5 mg  5 mg  Oral Q4H PRN Donita Brooks, NP   5 mg at 11/20/15 0030  . pantoprazole (PROTONIX) EC tablet 40 mg  40 mg Oral Daily [name], MD   40 mg at 11/19/15 1103  . phenol (CHLORASEPTIC) mouth spray 2 spray  2 spray Mouth/Throat PRN [name], MD      . polyethylene glycol (MIRALAX / GLYCOLAX) packet 17 g  17 g Oral BID [name], MD   17 g at 11/18/15 0944  . polyethylene glycol (MIRALAX / GLYCOLAX) packet 17 g  17 g Oral Q12H PRN [name], MD      . potassium chloride (K-DUR,KLOR-CON) CR tablet 30 mEq  30 mEq Oral Daily [name], MD   30 mEq at 11/19/15 1100  . prochlorperazine (COMPAZINE) injection 5-10 mg  5-10 mg Intravenous Q4H PRN [name], MD      . pseudoephedrine (SUDAFED) 12 hr tablet 120 mg  120 mg Oral Daily PRN [name], MD      . rOPINIRole (REQUIP) tablet 4 mg  4 mg Oral BID [name], MD   4 mg at 11/19/15 2228  . rosuvastatin (CRESTOR) tablet 10 mg  10 mg Oral Q Sat [name], MD   10 mg at 11/16/15 0802  . simethicone (MYLICON) chewable tablet 40 mg  40 mg Oral Q6H PRN [name], MD      . sodium chloride flush (NS) 0.9 % injection 3 mL  3 mL Intravenous Q12H [name], MD   3 mL at 11/19/15 2229  . sodium chloride flush (NS) 0.9 % injection 3 mL  3 mL Intravenous PRN [name], MD      . torsemide [facility]) tablet 60 mg  60 mg Oral Daily [name], MD   60 mg at 11/19/15 1115  . traZODone (DESYREL) tablet  50 mg  50 mg Oral QHS PRN [name], MD      . vitamin C (ASCORBIC ACID) tablet 500 mg  500 mg Oral BID [name], MD   500 mg at 11/19/15 2228     Allergies  Allergen Reactions  . Almond Oil Anaphylaxis  . Ceclor [Cefaclor] Nausea And Vomiting  . Ciprofloxacin Other (See Comments)    Makes joints and muscles ache  . Levaquin [Levofloxacin] Other (See Comments)    Body aches  . Sulfa Antibiotics Nausea And Vomiting    Disposition: 01-Home or Self Care  Discharge Instructions    Call MD for:    Complete by:  As directed     Temperature > 101.71F   Call MD for:  extreme fatigue    Complete by:  As directed    Call MD for:  hives    Complete by:  As directed    Call MD for:  persistant nausea and vomiting    Complete by:  As directed    Call MD for:  redness, tenderness, or signs of infection (pain, swelling, redness, odor or green/yellow discharge around incision site)    Complete by:  As directed    Call MD for:  severe uncontrolled pain    Complete by:  As directed    Diet - low sodium heart healthy    Complete by:  As directed    Start with bland, low residue diet for a few days, then advance to a heart healthy (low fat, high fiber) diet.  If you feel nauseated or constipated, simplify to a liquid only diet for 48 hours until you are feeling better (no more nausea, farting/passing gas, having a bowel movement, etc...).  If you cannot tolerate even drinking liquids, or feeling worse, let your surgeon know or go to the Emergency Department for help.   Discharge instructions    Complete by:  As directed    Please see discharge instruction sheets.   Also refer to any handouts/printouts that may have been given from the [facility] surgery office (if you visited [place] there before surgery) Please call our office if you have any questions or concerns [phone]   Discharge wound care:    Complete by:  As directed    If you have closed incisions: Shower and bathe over these incisions with soap and water every day.  It is OK to wash over the dressings: they are waterproof. Remove all surgical dressings on postoperative day #3.  You do not need to replace dressings over the closed incisions unless you feel more comfortable with a Band-Aid covering it.   If you have an open wound: That requires packing, so please see wound care instructions.   In general, remove all dressings, wash wound with soap and water and then replace with saline moistened gauze.  Do the dressing change at least every day.    Please call our  office [phone] if you have further questions.   Driving Restrictions    Complete by:  As directed    No driving until off narcotics and can safely swerve away without pain during an emergency   Increase activity slowly    Complete by:  As directed    Lifting restrictions    Complete by:  As directed    Avoid heavy lifting initially, <20 pounds at first.   Do not push through pain.   You have no specific weight limit: If it hurts to do, DON'T DO  IT.    If you feel no pain, you are not injuring anything.  Pain will protect you from injury.   Coughing and sneezing are far more stressful to your incision than any lifting.   Avoid resuming heavy lifting (>50 pounds) or other intense activity until off all narcotic pain medications.   When want to exercise more, give yourself 2 weeks to gradually get back to full intense exercise/activity.   May shower / Bathe    Complete by:  As directed    [place].  It is fine for dressings or wounds to be washed/rinsed.  Use gentle soap & water.  This will help the incisions and/or wounds get clean & minimize infection.   May walk up steps    Complete by:  As directed    Sexual Activity Restrictions    Complete by:  As directed    Sexual activity as tolerated.  Do not push through pain.  Pain will protect you from injury.   Walk with assistance    Complete by:  As directed    Walk over an hour a day.  May use a walker/cane/companion to help with balance and stamina.      Follow-up Information    GROSS,STEVEN C., MD. Schedule an appointment as soon as possible for a visit in 3 week(s).   Specialty:  General Surgery Why:  To follow up after your operation, To follow up after your hospital stay Contact information: [address] [place] [address] [phone]        [facility] .   Specialty:  Home Health Services Why:  Physical therapy Contact information: [address] [place]  [id number] [phone]            Signed: Olene Floss, PA-S [facility]   11/20/2015, 8:44 AM

## 2015-11-21 ENCOUNTER — Telehealth: Payer: Self-pay | Admitting: Internal Medicine

## 2015-11-21 DIAGNOSIS — R531 Weakness: Secondary | ICD-10-CM | POA: Diagnosis not present

## 2015-11-21 NOTE — Telephone Encounter (Signed)
Refill for proair has been sent in. Nothing further needed

## 2015-11-25 DIAGNOSIS — R531 Weakness: Secondary | ICD-10-CM | POA: Diagnosis not present

## 2015-11-28 DIAGNOSIS — R531 Weakness: Secondary | ICD-10-CM | POA: Diagnosis not present

## 2015-11-29 DIAGNOSIS — R69 Illness, unspecified: Secondary | ICD-10-CM | POA: Diagnosis not present

## 2015-12-02 DIAGNOSIS — R531 Weakness: Secondary | ICD-10-CM | POA: Diagnosis not present

## 2015-12-06 DIAGNOSIS — R531 Weakness: Secondary | ICD-10-CM | POA: Diagnosis not present

## 2015-12-09 DIAGNOSIS — R531 Weakness: Secondary | ICD-10-CM | POA: Diagnosis not present

## 2015-12-11 ENCOUNTER — Encounter: Payer: Self-pay | Admitting: Pulmonary Disease

## 2015-12-11 ENCOUNTER — Ambulatory Visit (INDEPENDENT_AMBULATORY_CARE_PROVIDER_SITE_OTHER): Payer: Medicare HMO | Admitting: Pulmonary Disease

## 2015-12-11 DIAGNOSIS — J4530 Mild persistent asthma, uncomplicated: Secondary | ICD-10-CM

## 2015-12-11 DIAGNOSIS — G4733 Obstructive sleep apnea (adult) (pediatric): Secondary | ICD-10-CM | POA: Diagnosis not present

## 2015-12-11 DIAGNOSIS — G4710 Hypersomnia, unspecified: Secondary | ICD-10-CM | POA: Diagnosis not present

## 2015-12-11 DIAGNOSIS — G4730 Sleep apnea, unspecified: Secondary | ICD-10-CM

## 2015-12-11 DIAGNOSIS — Z23 Encounter for immunization: Secondary | ICD-10-CM | POA: Diagnosis not present

## 2015-12-11 NOTE — Assessment & Plan Note (Signed)
Weight reduction 

## 2015-12-11 NOTE — Assessment & Plan Note (Addendum)
Pt has hypersomnia despite CPAP use. OSA is corrected. Hypersomnia affecting her fxnality.  She gets sleepy driving. Hypersomnia multifactorial: 2/2 OSA and sedating meds.  ABG in 2017 with no evidence for hypercapnea.  She takes : Gabapentin, Ativan, Trazodone, Requip. She CAN NOT wean off meds.   Plan : 1. Cont cpap use.  2. She has cut down gabapentin from 400 mg to 300 mg hs, stopped ativan at hs and uses ativan prn daytime only. She uses Requip 4 mg BID and Trazodone 50 mg at hs. Plan to try cutting Trazodone to 25 mg at HS. May also decrease requip to 2 mg in am and 88m at HS. Cont with Celexa BID for now.

## 2015-12-11 NOTE — Patient Instructions (Signed)
We extensively discussed the importance of treating OSA and the need to use PAP therapy.   Continue with cpap 12 cm water.  We will order you supplies for cpap.    Patient was instructed to have mask, tubings, filter, reservoir cleaned at least once a week with soapy water.  Patient was instructed to call the office if he/she is having issues with the PAP device.    I advised patient to obtain sufficient amount of sleep --  7 to 8 hours at least in a 24 hr period.  Patient was advised to follow good sleep hygiene.  Patient was advised NOT to engage in activities requiring concentration and/or vigilance if he/she is and  sleepy.  Patient is NOT to drive if he/she is sleepy.   Return to clinic in 4 months

## 2015-12-11 NOTE — Assessment & Plan Note (Addendum)
Patient is being seen for hypersomnia and OSA.  Pt had PSG in 08/2012 moderate OSA, optimal on cpap 13 cm water.  Always with good compliance and OSA corrected.  DL for Aug  2017 > 100%, AHI 0.8. Cont CPAP use.  Will address hypersomnia in a different problem.  Pt has not received supplies in 56mo.  Awaiting [employer]. Will call [employer] again. Pt switched insurance so that may be the hold up with supplies.

## 2015-12-11 NOTE — Progress Notes (Signed)
Subjective:    Patient ID: Kathleen Hatfield, female    DOB: 1955-03-17, 60 y.o.   MRN: [id number]  HPI Pt is being seen by the practice for SOB/ small airways dse/ asthma  and OSA.  ROV (06/07/15) Patient is here for follow-up on her sleep apnea. She feels tried despite using cpap.  Last download from December 2016 until March 2017 showed AHI of 0.5, 61% compliance. Last ABG in the system showed pH of 7.52, CO2 46, PO2 34 (venous blood gas). She sleeps 5-7 hrs/ 24 hrs period. Feels benefit of using cpap. More energy. A lot of stress with mother having dementia and she has health issues. Uses ativan prn. No scheduled AD. Recent cbc showed mild anemia, mildly increased creat. TSH was N.   ROV (09/16/15)  Pt is here for f/u on her sleepiness and OSA.  Since last seen, she states she continues to be sleepy. She sleeps 10 hrs ina 24 hr period. Feels unrefreshed when she wakes up. Uses cpap. Feels better using it. No issues with it. DL for 3 mos > 91% and AHI of 0.8. She takes gabapentin 400 mg, Ativan 1 mg, Trazodone 50 mg, Reqiup 4 mg BID > all at bedtime. ABG in 06/2015 > 7.48/36/86 on RA. Asthma has been stable; uses advir 2P BID. Not on pred. Not on o2.   ROV (12/11/2015)  Pt is here as f/u on her osa.  Since last seen, she had her hernia repair at [facility] on 9/8. Her surgery went OK but had pain issues.  She ended up getting a lot of pain meds post op.  She had hypersomnia with pain meds. Pain meds were weaned off.  She feels better now.  DL the last month > 100%, AHI 0.8. Still with feeling of "tiredness/hypersomnia/fatigue".   Review of Systems  Constitutional: Positive for fatigue.       Hypersomnia, tiredness  HENT: Negative.   Eyes: Negative.   Respiratory: Positive for cough and shortness of breath.   Cardiovascular: Negative.   Gastrointestinal: Negative.   Endocrine: Negative.   Genitourinary: Negative.   Musculoskeletal: Negative.   Allergic/Immunologic: Negative.   Neurological: Negative.     Hematological: Negative.   Psychiatric/Behavioral: Negative.   All other systems reviewed and are negative.     Objective:   Physical Exam   Vitals:  Vitals:   12/11/15 1647  BP: 126/80  Pulse: 74  SpO2: 96%  Weight: 269 lb 9.6 oz (122.3 kg)  Height: 5' 5"  (1.651 m)    Constitutional/General:  Pleasant, well-nourished, well-developed, not in any distress,  Comfortably seating.  Well kempt  Body mass index is 44.86 kg/m. Wt Readings from Last 3 Encounters:  12/11/15 269 lb 9.6 oz (122.3 kg)  11/15/15 265 lb (120.2 kg)  11/05/15 265 lb (120.2 kg)    HEENT: Pupils equal and reactive to light and accommodation. Anicteric sclerae. Normal nasal mucosa.   No oral  lesions,  mouth clear,  oropharynx clear, no postnasal drip. (-) Oral thrush. No dental caries.  Airway - Mallampati class IV  Neck: No masses. Midline trachea. No JVD, (-) LAD. (-) bruits appreciated.  Respiratory/Chest: Grossly normal chest. (-) deformity. (-) Accessory muscle use.  Symmetric expansion. (-) Tenderness on palpation.  Resonant on percussion.  Diminished BS on both lower lung zones. (-) rhonchi. Crackles at bases. Occasional wheeze.  (-) egophony  Cardiovascular: Regular rate and  rhythm, heart sounds normal, no murmur or gallops, Gr 1 edema  Gastrointestinal:  Normal bowel sounds. Soft, non-tender. No hepatosplenomegaly.  (-) masses.   Musculoskeletal:  Normal muscle tone. Normal gait.   Extremities: Grossly normal. (-) clubbing, cyanosis.  Gr 1  edema  Skin: (-) rash,lesions seen.   Neurological/Psychiatric : alert, oriented to time, place, person. Normal mood and affect           Assessment & Plan:  OSA (obstructive sleep apnea) Patient is being seen for hypersomnia and OSA.  Pt had PSG in 08/2012 moderate OSA, optimal on cpap 13 cm water.  Always with good compliance and OSA corrected.  DL for Aug  2017 > 100%, AHI 0.8. Cont CPAP use.  Will address hypersomnia in a  different problem.  Pt has not received supplies in 54mo.  Awaiting [employer]. Will call [employer] again. Pt switched insurance so that may be the hold up with supplies.     Hypersomnia with sleep apnea Pt has hypersomnia despite CPAP use. OSA is corrected. Hypersomnia affecting her fxnality.  She gets sleepy driving. Hypersomnia multifactorial: 2/2 OSA and sedating meds.  ABG in 2017 with no evidence for hypercapnea.  She takes : Gabapentin, Ativan, Trazodone, Requip. She CAN NOT wean off meds.   Plan : 1. Cont cpap use.  2. She has cut down gabapentin from 400 mg to 300 mg hs, stopped ativan at hs and uses ativan prn daytime only. She uses Requip 4 mg BID and Trazodone 50 mg at hs. Plan to try cutting Trazodone to 25 mg at HS. May also decrease requip to 2 mg in am and 432mat HS. Cont with Celexa BID for now.   Mild persistent chronic asthma without complication Pt with chronic asthma.  Asthma seems stable.  02/2013: CT Chest : consistent with small airway obstruction RHC 02/2013: normal, no pulm HTN Allergy profile 04/14/14   Eos 0.3/ IgE 196 with Dust> Cat > Dog  PFT's  07/02/2015  FEV1 1.50 (56 % ) ratio 69  p 25 % improvement from saba p no rx prior to study with DLCO  60 % corrects to 99 % for alv volume  With ERV 15% and moderate restrictive component c/w obesity  Cont advair 2 P BID, cont alb prn.  Needs flu shot today. Got PNA 23 several yrs ago.  Needs prevnar 13.   Morbid obesity (HCPowhatanWeight reduction     RTC in 3 mos.      J.Monica BectonMD 12/11/2015, 11:50 PM [facility] Pager [phone] After 3 pm or if no answer, call [phone]

## 2015-12-11 NOTE — Assessment & Plan Note (Addendum)
Pt with chronic asthma.  Asthma seems stable.  02/2013: CT Chest : consistent with small airway obstruction RHC 02/2013: normal, no pulm HTN Allergy profile 04/14/14   Eos 0.3/ IgE 196 with Dust> Cat > Dog  PFT's  07/02/2015  FEV1 1.50 (56 % ) ratio 69  p 25 % improvement from saba p no rx prior to study with DLCO  60 % corrects to 99 % for alv volume  With ERV 15% and moderate restrictive component c/w obesity  Cont advair 2 P BID, cont alb prn.  Needs flu shot today. Got PNA 23 several yrs ago.  Needs prevnar 13.

## 2015-12-12 DIAGNOSIS — R531 Weakness: Secondary | ICD-10-CM | POA: Diagnosis not present

## 2015-12-12 DIAGNOSIS — G4733 Obstructive sleep apnea (adult) (pediatric): Secondary | ICD-10-CM | POA: Diagnosis not present

## 2015-12-12 DIAGNOSIS — E1129 Type 2 diabetes mellitus with other diabetic kidney complication: Secondary | ICD-10-CM | POA: Diagnosis not present

## 2015-12-16 DIAGNOSIS — R531 Weakness: Secondary | ICD-10-CM | POA: Diagnosis not present

## 2015-12-18 DIAGNOSIS — R531 Weakness: Secondary | ICD-10-CM | POA: Diagnosis not present

## 2015-12-19 DIAGNOSIS — E1129 Type 2 diabetes mellitus with other diabetic kidney complication: Secondary | ICD-10-CM | POA: Diagnosis not present

## 2015-12-19 DIAGNOSIS — E1165 Type 2 diabetes mellitus with hyperglycemia: Secondary | ICD-10-CM | POA: Diagnosis not present

## 2015-12-25 DIAGNOSIS — E1129 Type 2 diabetes mellitus with other diabetic kidney complication: Secondary | ICD-10-CM | POA: Diagnosis not present

## 2015-12-25 DIAGNOSIS — E1165 Type 2 diabetes mellitus with hyperglycemia: Secondary | ICD-10-CM | POA: Diagnosis not present

## 2016-01-09 DIAGNOSIS — E1129 Type 2 diabetes mellitus with other diabetic kidney complication: Secondary | ICD-10-CM | POA: Diagnosis not present

## 2016-01-09 DIAGNOSIS — E1165 Type 2 diabetes mellitus with hyperglycemia: Secondary | ICD-10-CM | POA: Diagnosis not present

## 2016-01-12 DIAGNOSIS — J449 Chronic obstructive pulmonary disease, unspecified: Secondary | ICD-10-CM | POA: Diagnosis not present

## 2016-01-12 DIAGNOSIS — J0100 Acute maxillary sinusitis, unspecified: Secondary | ICD-10-CM | POA: Diagnosis not present

## 2016-01-12 DIAGNOSIS — R509 Fever, unspecified: Secondary | ICD-10-CM | POA: Diagnosis not present

## 2016-01-16 DIAGNOSIS — E1165 Type 2 diabetes mellitus with hyperglycemia: Secondary | ICD-10-CM | POA: Diagnosis not present

## 2016-01-16 DIAGNOSIS — E1149 Type 2 diabetes mellitus with other diabetic neurological complication: Secondary | ICD-10-CM | POA: Diagnosis not present

## 2016-01-17 DIAGNOSIS — G4733 Obstructive sleep apnea (adult) (pediatric): Secondary | ICD-10-CM | POA: Diagnosis not present

## 2016-02-17 DIAGNOSIS — G4733 Obstructive sleep apnea (adult) (pediatric): Secondary | ICD-10-CM | POA: Diagnosis not present

## 2016-02-26 DIAGNOSIS — G4733 Obstructive sleep apnea (adult) (pediatric): Secondary | ICD-10-CM | POA: Diagnosis not present

## 2016-03-13 ENCOUNTER — Other Ambulatory Visit: Payer: Self-pay | Admitting: Internal Medicine

## 2016-03-24 DIAGNOSIS — E1129 Type 2 diabetes mellitus with other diabetic kidney complication: Secondary | ICD-10-CM | POA: Diagnosis not present

## 2016-03-29 DIAGNOSIS — J111 Influenza due to unidentified influenza virus with other respiratory manifestations: Secondary | ICD-10-CM | POA: Diagnosis not present

## 2016-03-29 DIAGNOSIS — R0600 Dyspnea, unspecified: Secondary | ICD-10-CM | POA: Diagnosis not present

## 2016-03-31 ENCOUNTER — Emergency Department (HOSPITAL_COMMUNITY): Payer: Medicare HMO

## 2016-03-31 ENCOUNTER — Encounter (HOSPITAL_COMMUNITY): Payer: Self-pay | Admitting: Emergency Medicine

## 2016-03-31 DIAGNOSIS — I110 Hypertensive heart disease with heart failure: Secondary | ICD-10-CM | POA: Insufficient documentation

## 2016-03-31 DIAGNOSIS — E1140 Type 2 diabetes mellitus with diabetic neuropathy, unspecified: Secondary | ICD-10-CM

## 2016-03-31 DIAGNOSIS — J111 Influenza due to unidentified influenza virus with other respiratory manifestations: Secondary | ICD-10-CM | POA: Insufficient documentation

## 2016-03-31 DIAGNOSIS — Z7984 Long term (current) use of oral hypoglycemic drugs: Secondary | ICD-10-CM

## 2016-03-31 DIAGNOSIS — R0600 Dyspnea, unspecified: Secondary | ICD-10-CM | POA: Diagnosis not present

## 2016-03-31 DIAGNOSIS — I5032 Chronic diastolic (congestive) heart failure: Secondary | ICD-10-CM | POA: Insufficient documentation

## 2016-03-31 DIAGNOSIS — Z79899 Other long term (current) drug therapy: Secondary | ICD-10-CM | POA: Insufficient documentation

## 2016-03-31 DIAGNOSIS — J441 Chronic obstructive pulmonary disease with (acute) exacerbation: Secondary | ICD-10-CM

## 2016-03-31 DIAGNOSIS — R0602 Shortness of breath: Secondary | ICD-10-CM | POA: Diagnosis not present

## 2016-03-31 DIAGNOSIS — R05 Cough: Secondary | ICD-10-CM | POA: Diagnosis not present

## 2016-03-31 LAB — CBC WITH DIFFERENTIAL/PLATELET
Basophils Absolute: 0 10*3/uL (ref 0.0–0.1)
Basophils Relative: 0 %
Eosinophils Absolute: 0.2 10*3/uL (ref 0.0–0.7)
Eosinophils Relative: 2 %
HCT: 37.7 % (ref 36.0–46.0)
Hemoglobin: 12 g/dL (ref 12.0–15.0)
Lymphocytes Relative: 37 %
Lymphs Abs: 3 10*3/uL (ref 0.7–4.0)
MCH: 24.9 pg — ABNORMAL LOW (ref 26.0–34.0)
MCHC: 31.8 g/dL (ref 30.0–36.0)
MCV: 78.2 fL (ref 78.0–100.0)
Monocytes Absolute: 0.9 10*3/uL (ref 0.1–1.0)
Monocytes Relative: 11 %
Neutro Abs: 4 10*3/uL (ref 1.7–7.7)
Neutrophils Relative %: 50 %
Platelets: 406 10*3/uL — ABNORMAL HIGH (ref 150–400)
RBC: 4.82 MIL/uL (ref 3.87–5.11)
RDW: 17.9 % — ABNORMAL HIGH (ref 11.5–15.5)
WBC: 8.1 10*3/uL (ref 4.0–10.5)

## 2016-03-31 LAB — COMPREHENSIVE METABOLIC PANEL
ALT: 50 U/L (ref 14–54)
AST: 42 U/L — ABNORMAL HIGH (ref 15–41)
Albumin: 3.7 g/dL (ref 3.5–5.0)
Alkaline Phosphatase: 51 U/L (ref 38–126)
Anion gap: 11 (ref 5–15)
BUN: 13 mg/dL (ref 6–20)
CO2: 28 mmol/L (ref 22–32)
Calcium: 9.3 mg/dL (ref 8.9–10.3)
Chloride: 100 mmol/L — ABNORMAL LOW (ref 101–111)
Creatinine, Ser: 1.37 mg/dL — ABNORMAL HIGH (ref 0.44–1.00)
GFR calc Af Amer: 47 mL/min — ABNORMAL LOW (ref >60)
GFR calc non Af Amer: 41 mL/min — ABNORMAL LOW (ref >60)
Glucose, Bld: 169 mg/dL — ABNORMAL HIGH (ref 65–99)
Potassium: 3.3 mmol/L — ABNORMAL LOW (ref 3.5–5.1)
Sodium: 139 mmol/L (ref 135–145)
Total Bilirubin: 0.2 mg/dL — ABNORMAL LOW (ref 0.3–1.2)
Total Protein: 7.6 g/dL (ref 6.5–8.1)

## 2016-03-31 NOTE — ED Triage Notes (Signed)
Pt dx with flu 2 days ago  St's now pt c/o shortness of breath and cough.  Pt sent here by her PCP

## 2016-04-01 ENCOUNTER — Emergency Department (HOSPITAL_COMMUNITY)
Admission: EM | Admit: 2016-04-01 | Discharge: 2016-04-01 | Disposition: A | Discharge status: Home / Self Care | Payer: Medicare HMO | Source: Home / Self Care | Attending: Emergency Medicine | Admitting: Emergency Medicine

## 2016-04-01 DIAGNOSIS — J441 Chronic obstructive pulmonary disease with (acute) exacerbation: Secondary | ICD-10-CM

## 2016-04-01 DIAGNOSIS — J111 Influenza due to unidentified influenza virus with other respiratory manifestations: Secondary | ICD-10-CM

## 2016-04-01 MED ORDER — ALBUTEROL (5 MG/ML) CONTINUOUS INHALATION SOLN
15.0000 mg/h | INHALATION_SOLUTION | Freq: Once | RESPIRATORY_TRACT | Status: AC
  Administered 2016-04-01: 15 mg/h via RESPIRATORY_TRACT
  Filled 2016-04-01: qty 20

## 2016-04-01 MED ORDER — GUAIFENESIN-CODEINE 100-10 MG/5ML PO SOLN: 10.0000 mL | Freq: Four times a day (QID) | ORAL | 0 refills | Status: DC | PRN

## 2016-04-01 MED ORDER — METHYLPREDNISOLONE SODIUM SUCC 125 MG IJ SOLR
125.0000 mg | Freq: Once | INTRAMUSCULAR | Status: AC
  Administered 2016-04-01: 125 mg via INTRAMUSCULAR
  Filled 2016-04-01: qty 2

## 2016-04-01 MED ORDER — ALBUTEROL SULFATE HFA 108 (90 BASE) MCG/ACT IN AERS
2.0000 | INHALATION_SPRAY | Freq: Once | RESPIRATORY_TRACT | Status: AC
  Administered 2016-04-01: 2 via RESPIRATORY_TRACT
  Filled 2016-04-01: qty 6.7

## 2016-04-01 MED ORDER — PREDNISONE 20 MG PO TABS: 60.0000 mg | ORAL_TABLET | Freq: Every day | ORAL | 0 refills | Status: DC

## 2016-04-01 MED ORDER — ONDANSETRON 4 MG PO TBDP: 4.0000 mg | ORAL_TABLET | Freq: Three times a day (TID) | ORAL | 0 refills | Status: DC | PRN

## 2016-04-01 MED ORDER — GUAIFENESIN 100 MG/5ML PO SOLN
10.0000 mL | Freq: Once | ORAL | Status: AC
  Administered 2016-04-01: 200 mg via ORAL
  Filled 2016-04-01: qty 10

## 2016-04-01 MED ORDER — IPRATROPIUM BROMIDE 0.02 % IN SOLN
1.0000 mg | Freq: Once | RESPIRATORY_TRACT | Status: AC
  Administered 2016-04-01: 1 mg via RESPIRATORY_TRACT
  Filled 2016-04-01: qty 5

## 2016-04-01 NOTE — ED Notes (Signed)
Continuous neb still running. Pt sitting on edge of bed

## 2016-04-01 NOTE — ED Notes (Signed)
Pt sitting up on the side of the bed receiving hour long nebulizer treatment. Denies pain at this time and states "I'm just sleepy" Pt appears to be in NAD.

## 2016-04-01 NOTE — Progress Notes (Signed)
On arrival no distress noted. SATs stable. CAT started

## 2016-04-01 NOTE — Discharge Instructions (Signed)
You may alternate Tylenol 1000 mg every 6 hours as needed for fever and pain and ibuprofen 800 mg every 8 hours as needed for fever and pain. You may use your albuterol nebulizer machine every 4 hours as needed and use your albuterol inhaler 2-4 puffs every 2-4 hours as needed. Please take your steroids until complete and start them this afternoon.  If you feel like your shortness of breath is worsening, you have blue lips are blue fingertips, feel like you're going to pass out, begin vomiting and cannot stop, please return to the hospital.

## 2016-04-01 NOTE — ED Notes (Signed)
Pt reports SHOB. Pt states she is unable to lay down flat, due to dyspnea.

## 2016-04-01 NOTE — ED Provider Notes (Signed)
By signing my name below, I, Evelene Croon, attest that this documentation has been prepared under the direction and in the presence of Tok, DO . Electronically Signed: Evelene Croon, Scribe. 04/01/2016. 12:56 AM.  TIME SEEN: 12:37 AM  CHIEF COMPLAINT: Chief Complaint  Patient presents with  . Shortness of Breath     HPI:  HPI Comments:  Kathleen MCCANDLISH is a 61 y.o. female with a history of DM and COPD, who presents to the Emergency Department complaining of worsening SOB x 1 day. Pt reports associated wheezing and an occasionally productive cough with green phlegm. Pt was evaluated at  Urgent Care for the same on 03/29/2016; states she was diagnosed with influenza A and was advised to return if breathing worsened. She was started on Tamiflu at that time; no mucinex. Pt returned to Urgent care today when SOB worsened and was sent to the ED for further evaluation. Pt notes she has also been using home neb treatment with little relief. States she was given a breathing treatment at urgent care. Is not on steroids.   ROS: See HPI Constitutional: no fever  Eyes: no drainage  ENT: no runny nose   Cardiovascular:  no chest pain  Resp: SOB  GI: no vomiting GU: no dysuria Integumentary: no rash  Allergy: no hives  Musculoskeletal: no leg swelling  Neurological: no slurred speech ROS otherwise negative  PAST MEDICAL HISTORY/PAST SURGICAL HISTORY:  Past Medical History:  Diagnosis Date  . Anemia   . Arthritis   . Asthma   . CHF (congestive heart failure) ([place]) 05/2014  . Chronic diastolic heart failure ([place])   . Depression   . Diabetes mellitus without complication ([place])   . Diverticulitis   . Dyspnea    with exertion  . GERD (gastroesophageal reflux disease)   . History of blood transfusion   . Hyperlipidemia    cannot tolerate statins  . Hypertension   . Peripheral neuropathy ([place])   . Pneumonia   . PONV (postoperative nausea and vomiting)   . Sleep apnea    uses  CPAP  . Ulcerative colitis ([place])    dr Collene Mares    MEDICATIONS:  Prior to Admission medications   Medication Sig Start Date End Date Taking? Authorizing Provider  acetaminophen (TYLENOL) 500 MG tablet Take 500 mg by mouth every 4 (four) hours as needed (pain).     Historical Provider, MD  ADVAIR [facility] 115-21 MCG/ACT inhaler INHALE 2 PUFFS INTO THE LUNGS 2 TIMES DAILY. 03/13/16   Tanda Rockers, MD  albuterol (PROVENTIL) (2.5 MG/3ML) 0.083% nebulizer solution Take 3 mLs (2.5 mg total) by nebulization every 4 (four) hours as needed for wheezing. 08/26/15   Tanda Rockers, MD  Alum Hydroxide-Mag Carbonate (GAVISCON PO) Take 2 tablets by mouth at bedtime as needed (reflux).     Historical Provider, MD  AMITIZA 24 MCG capsule Take 1 capsule by mouth 2 (two) times daily as needed. 10/22/15   Historical Provider, MD  baclofen (LIORESAL) 5 mg TABS tablet Take 2.5 mg by mouth 2 (two) times daily as needed for muscle spasms.  03/27/13   Dixon Boos, MD  BAYER CONTOUR NEXT TEST test strip As directed 07/30/15   Historical Provider, MD  Cholecalciferol (VITAMIN D-3) 5000 UNITS TABS Take 5,000 Units by mouth daily.     Historical Provider, MD  DULoxetine (CYMBALTA) 60 MG capsule Take 60 mg by mouth 2 (two) times daily.     Historical Provider, MD  estradiol (ESTRACE) 2 MG tablet Take 1 tablet (2 mg total) by mouth daily. 07/29/11   Delsa Bern, MD  fenofibrate (TRICOR) 145 MG tablet Take 145 mg by mouth at bedtime.  09/28/14   Historical Provider, MD  folic acid (FOLVITE) 1 MG tablet Take 1 mg by mouth 2 (two) times daily.    Historical Provider, MD  gabapentin (NEURONTIN) 100 MG capsule Take 300 mg by mouth at bedtime.     Historical Provider, MD  GLOBAL EASE INJECT PEN NEEDLES 32G X 4 MM MISC  06/19/15   Historical Provider, MD  Liraglutide (VICTOZA) 18 MG/3ML SOPN Inject 1.2 mg into the skin every evening.     Historical Provider, MD  LORazepam (ATIVAN) 1 MG tablet Take 1 tablet by mouth at bedtime.      Historical Provider, MD  metFORMIN (GLUCOPHAGE-XR) 750 MG 24 hr tablet Take 750 mg by mouth daily.     Historical Provider, MD  MICROLET LANCETS Lane As directed 07/30/15   Historical Provider, MD  mupirocin ointment (BACTROBAN) 2 % Apply 1 application topically 2 (two) times daily. For 5 days 10/22/15   Historical Provider, MD  pantoprazole (PROTONIX) 40 MG tablet Take 40 mg by mouth daily.  04/14/12   Historical Provider, MD  potassium chloride (K-DUR,KLOR-CON) 10 MEQ tablet Take 30 mEq by mouth daily.  03/25/15   Historical Provider, MD  [place] into the lungs at bedtime. CPAP    Historical Provider, MD  PROAIR HFA 108 (90 Base) MCG/ACT inhaler INHALE 2 PUFFS INTO THE LUNGS EVERY FOUR HOURS AS NEEDED FOR WHEEZING. 11/21/15   Tanda Rockers, MD  pseudoephedrine (SUDAFED) 120 MG 12 hr tablet Take 120 mg by mouth daily as needed for congestion.    Historical Provider, MD  rOPINIRole (REQUIP) 4 MG tablet Take 4 mg by mouth 2 (two) times daily.     Historical Provider, MD  rosuvastatin (CRESTOR) 10 MG tablet Take 10 mg by mouth once a week. On Saturday or Sunday 03/25/15   Historical Provider, MD  torsemide (DEMADEX) 20 MG tablet TAKE 3 TABLETS DAILY Patient taking differently: Take 60 mg by mouth in the morning 12/24/14   [name], MD  traMADol (ULTRAM) 50 MG tablet Take 1.5-2 tablets (75-100 mg total) by mouth every 6 (six) hours as needed for severe pain. 11/15/15   [name], MD  traZODone (DESYREL) 50 MG tablet Take 50 mg by mouth at bedtime as needed for sleep.  06/19/15   Historical Provider, MD  TRESIBA FLEXTOUCH 100 UNIT/ML SOPN Inject 15 Units into the skin at bedtime. Max of 60 units/day 06/19/15   Historical Provider, MD    ALLERGIES:  Allergies  Allergen Reactions  . Almond Oil Anaphylaxis  . Ceclor [Cefaclor] Nausea And Vomiting  . Ciprofloxacin Other (See Comments)    Makes joints and muscles ache  . Levaquin [Levofloxacin] Other (See Comments)    Body  aches  . Morphine And Related     Pt. States while in the hospital it affected her breathing, O2 dropped to the 70's  . Sulfa Antibiotics Nausea And Vomiting    SOCIAL HISTORY:  Social History  Substance Use Topics  . Smoking status: Never Smoker  . Smokeless tobacco: Never Used  . Alcohol use No    FAMILY HISTORY: Family History  Problem Relation Age of Onset  . Heart disease Mother   . Hypertension Mother   . Dementia Mother   . Heart disease Father   .  COPD Father   . Hypertension Father     EXAM: BP 138/78 (BP Location: Right Arm)   Pulse 71   Temp 98.9 F (37.2 C) (Oral)   Resp 18   SpO2 95%  CONSTITUTIONAL: Alert and oriented and responds appropriately to questions. Well-appearing; well-nourished. Obese HEAD: Normocephalic EYES: Conjunctivae clear, PERRL, EOMI ENT: normal nose; no rhinorrhea; moist mucous membranes NECK: Supple, no meningismus, no nuchal rigidity, no LAD  CARD: RRR; S1 and S2 appreciated; no murmurs, no clicks, no rubs, no gallops RESP: Normal chest excursion without splinting or tachypnea; breath sounds equal bilaterally; Diffuse expiratory wheezes;  no rhonchi, no rales, no hypoxia or respiratory distress, speaking full sentences ABD/GI: Normal bowel sounds; non-distended; soft, non-tender, no rebound, no guarding, no peritoneal signs, no hepatosplenomegaly BACK:  The back appears normal and is non-tender to palpation, there is no CVA tenderness EXT: Normal ROM in all joints; non-tender to palpation; no edema; normal capillary refill; no cyanosis, no calf tenderness or swelling    SKIN: Normal color for age and race; warm; no rash NEURO: Moves all extremities equally, sensation to light touch intact diffusely, cranial nerves II through XII intact, normal speech PSYCH: The patient's mood and manner are appropriate. Grooming and personal hygiene are appropriate.  MEDICAL DECISION MAKING: Patient here with mild COPD exacerbation in the setting of  influenza. She is very well-appearing, afebrile, nontoxic here. No hypoxia, increased work of breathing or respiratory distress. We'll give continuous albuterol with Atrovent, Solu-Medrol, guaifenesin for symptomatically relief. We will ambulate patient with pulse oximetry. She has no hypoxia at rest on room air. Chest x-ray shows no edema, infiltrate or consolidation, ARDS.  ED PROGRESS: 2:45 AM  Pt's breath sounds are improving. Still receiving continuous treatment.   4:15 AM  Pt's lungs are almost completely clear to auscultation. Still some mild wheezing with deep expiration. Patient able to ambulate and her sats did not drop below 92%. States she has chronic dyspnea with exertion that is slightly worse with influenza. She feels that she can be discharged home. Have offered her admission but she declines. We will give her another albuterol inhaler to take with her and discharge with steroids and with medicine with codeine for symptomatically relief. BRCA2 and alternating Tylenol and Motrin. She is on Tamiflu. Recommended increased fluid intake. Discussed at length return precautions. She is comfortable with this plan. I suspect that this is a COPD exacerbation triggered by influenza.   At this time, I do not feel there is any life-threatening condition present. I have reviewed and discussed all results (EKG, imaging, lab, urine as appropriate) and exam findings with patient/family. I have reviewed nursing notes and appropriate previous records.  I feel the patient is safe to be discharged home without further emergent workup and can continue workup as an outpatient as needed. Discussed usual and customary return precautions. Patient/family verbalize understanding and are comfortable with this plan.  Outpatient follow-up has been provided. All questions have been answered.    I personally performed the services described in this documentation, which was scribed in my presence. The recorded information  has been reviewed and is accurate.     [place], DO 04/01/16 [phone]

## 2016-04-01 NOTE — ED Notes (Signed)
Patient was ambulated approx. 25 ft in the hallway, SpO2 remained between 92%-93%, which was patient's baseline.

## 2016-04-02 ENCOUNTER — Inpatient Hospital Stay (HOSPITAL_COMMUNITY)
Admission: EM | Admit: 2016-04-02 | Discharge: 2016-04-09 | DRG: 202 | Disposition: A | Discharge status: Home Health | Payer: Medicare HMO | Attending: Internal Medicine | Admitting: Internal Medicine

## 2016-04-02 ENCOUNTER — Emergency Department (HOSPITAL_COMMUNITY): Payer: Medicare HMO

## 2016-04-02 ENCOUNTER — Other Ambulatory Visit: Payer: Self-pay

## 2016-04-02 ENCOUNTER — Encounter (HOSPITAL_COMMUNITY): Payer: Self-pay

## 2016-04-02 DIAGNOSIS — I130 Hypertensive heart and chronic kidney disease with heart failure and stage 1 through stage 4 chronic kidney disease, or unspecified chronic kidney disease: Secondary | ICD-10-CM | POA: Diagnosis present

## 2016-04-02 DIAGNOSIS — G4733 Obstructive sleep apnea (adult) (pediatric): Secondary | ICD-10-CM | POA: Diagnosis present

## 2016-04-02 DIAGNOSIS — T380X5A Adverse effect of glucocorticoids and synthetic analogues, initial encounter: Secondary | ICD-10-CM | POA: Diagnosis not present

## 2016-04-02 DIAGNOSIS — M7989 Other specified soft tissue disorders: Secondary | ICD-10-CM | POA: Diagnosis not present

## 2016-04-02 DIAGNOSIS — J4530 Mild persistent asthma, uncomplicated: Secondary | ICD-10-CM | POA: Diagnosis present

## 2016-04-02 DIAGNOSIS — R0902 Hypoxemia: Secondary | ICD-10-CM

## 2016-04-02 DIAGNOSIS — J9601 Acute respiratory failure with hypoxia: Secondary | ICD-10-CM | POA: Diagnosis not present

## 2016-04-02 DIAGNOSIS — D509 Iron deficiency anemia, unspecified: Secondary | ICD-10-CM | POA: Diagnosis not present

## 2016-04-02 DIAGNOSIS — R05 Cough: Secondary | ICD-10-CM | POA: Diagnosis not present

## 2016-04-02 DIAGNOSIS — F419 Anxiety disorder, unspecified: Secondary | ICD-10-CM | POA: Diagnosis present

## 2016-04-02 DIAGNOSIS — Z6841 Body Mass Index (BMI) 40.0 and over, adult: Secondary | ICD-10-CM

## 2016-04-02 DIAGNOSIS — E1142 Type 2 diabetes mellitus with diabetic polyneuropathy: Secondary | ICD-10-CM | POA: Diagnosis present

## 2016-04-02 DIAGNOSIS — Z79899 Other long term (current) drug therapy: Secondary | ICD-10-CM

## 2016-04-02 DIAGNOSIS — J209 Acute bronchitis, unspecified: Principal | ICD-10-CM

## 2016-04-02 DIAGNOSIS — E119 Type 2 diabetes mellitus without complications: Secondary | ICD-10-CM

## 2016-04-02 DIAGNOSIS — E1165 Type 2 diabetes mellitus with hyperglycemia: Secondary | ICD-10-CM | POA: Diagnosis not present

## 2016-04-02 DIAGNOSIS — I5032 Chronic diastolic (congestive) heart failure: Secondary | ICD-10-CM | POA: Diagnosis not present

## 2016-04-02 DIAGNOSIS — E1122 Type 2 diabetes mellitus with diabetic chronic kidney disease: Secondary | ICD-10-CM | POA: Diagnosis present

## 2016-04-02 DIAGNOSIS — I5030 Unspecified diastolic (congestive) heart failure: Secondary | ICD-10-CM | POA: Diagnosis present

## 2016-04-02 DIAGNOSIS — I10 Essential (primary) hypertension: Secondary | ICD-10-CM | POA: Diagnosis not present

## 2016-04-02 DIAGNOSIS — R0603 Acute respiratory distress: Secondary | ICD-10-CM | POA: Diagnosis present

## 2016-04-02 DIAGNOSIS — K219 Gastro-esophageal reflux disease without esophagitis: Secondary | ICD-10-CM | POA: Diagnosis present

## 2016-04-02 DIAGNOSIS — N183 Chronic kidney disease, stage 3 unspecified: Secondary | ICD-10-CM | POA: Insufficient documentation

## 2016-04-02 DIAGNOSIS — N289 Disorder of kidney and ureter, unspecified: Secondary | ICD-10-CM

## 2016-04-02 DIAGNOSIS — R0600 Dyspnea, unspecified: Secondary | ICD-10-CM | POA: Diagnosis not present

## 2016-04-02 DIAGNOSIS — J101 Influenza due to other identified influenza virus with other respiratory manifestations: Secondary | ICD-10-CM

## 2016-04-02 DIAGNOSIS — N189 Chronic kidney disease, unspecified: Secondary | ICD-10-CM | POA: Insufficient documentation

## 2016-04-02 DIAGNOSIS — E785 Hyperlipidemia, unspecified: Secondary | ICD-10-CM | POA: Diagnosis present

## 2016-04-02 DIAGNOSIS — N182 Chronic kidney disease, stage 2 (mild): Secondary | ICD-10-CM | POA: Insufficient documentation

## 2016-04-02 DIAGNOSIS — N179 Acute kidney failure, unspecified: Secondary | ICD-10-CM | POA: Insufficient documentation

## 2016-04-02 DIAGNOSIS — R0602 Shortness of breath: Secondary | ICD-10-CM | POA: Diagnosis not present

## 2016-04-02 DIAGNOSIS — F329 Major depressive disorder, single episode, unspecified: Secondary | ICD-10-CM | POA: Diagnosis present

## 2016-04-02 DIAGNOSIS — J45901 Unspecified asthma with (acute) exacerbation: Secondary | ICD-10-CM | POA: Diagnosis present

## 2016-04-02 DIAGNOSIS — R739 Hyperglycemia, unspecified: Secondary | ICD-10-CM | POA: Diagnosis not present

## 2016-04-02 HISTORY — DX: Chronic kidney disease, unspecified: N18.9

## 2016-04-02 LAB — CBG MONITORING, ED
Glucose-Capillary: 233 mg/dL — ABNORMAL HIGH (ref 65–99)
Glucose-Capillary: 330 mg/dL — ABNORMAL HIGH (ref 65–99)

## 2016-04-02 LAB — CBC
HCT: 35.6 % — ABNORMAL LOW (ref 36.0–46.0)
Hemoglobin: 11.5 g/dL — ABNORMAL LOW (ref 12.0–15.0)
MCH: 24.6 pg — ABNORMAL LOW (ref 26.0–34.0)
MCHC: 32.3 g/dL (ref 30.0–36.0)
MCV: 76.2 fL — ABNORMAL LOW (ref 78.0–100.0)
Platelets: 414 10*3/uL — ABNORMAL HIGH (ref 150–400)
RBC: 4.67 MIL/uL (ref 3.87–5.11)
RDW: 17.6 % — ABNORMAL HIGH (ref 11.5–15.5)
WBC: 13.7 10*3/uL — ABNORMAL HIGH (ref 4.0–10.5)

## 2016-04-02 LAB — BASIC METABOLIC PANEL
Anion gap: 18 — ABNORMAL HIGH (ref 5–15)
BUN: 20 mg/dL (ref 6–20)
CO2: 22 mmol/L (ref 22–32)
Calcium: 9.3 mg/dL (ref 8.9–10.3)
Chloride: 95 mmol/L — ABNORMAL LOW (ref 101–111)
Creatinine, Ser: 1.41 mg/dL — ABNORMAL HIGH (ref 0.44–1.00)
GFR calc Af Amer: 46 mL/min — ABNORMAL LOW (ref >60)
GFR calc non Af Amer: 39 mL/min — ABNORMAL LOW (ref >60)
Glucose, Bld: 352 mg/dL — ABNORMAL HIGH (ref 65–99)
Potassium: 3.9 mmol/L (ref 3.5–5.1)
Sodium: 135 mmol/L (ref 135–145)

## 2016-04-02 LAB — DIFFERENTIAL
Basophils Absolute: 0 10*3/uL (ref 0.0–0.1)
Basophils Relative: 0 %
Eosinophils Absolute: 0 10*3/uL (ref 0.0–0.7)
Eosinophils Relative: 0 %
Lymphocytes Relative: 16 %
Lymphs Abs: 2.1 10*3/uL (ref 0.7–4.0)
Monocytes Absolute: 1.3 10*3/uL — ABNORMAL HIGH (ref 0.1–1.0)
Monocytes Relative: 10 %
Neutro Abs: 9.5 10*3/uL — ABNORMAL HIGH (ref 1.7–7.7)
Neutrophils Relative %: 74 %

## 2016-04-02 LAB — GLUCOSE, CAPILLARY
Glucose-Capillary: 310 mg/dL — ABNORMAL HIGH (ref 65–99)
Glucose-Capillary: 363 mg/dL — ABNORMAL HIGH (ref 65–99)

## 2016-04-02 LAB — INFLUENZA PANEL BY PCR (TYPE A & B)
Influenza A By PCR: POSITIVE — AB
Influenza B By PCR: NEGATIVE

## 2016-04-02 LAB — MRSA PCR SCREENING: MRSA by PCR: POSITIVE — AB

## 2016-04-02 MED ORDER — TRAMADOL HCL 50 MG PO TABS: 75.0000 mg | ORAL_TABLET | Freq: Four times a day (QID) | ORAL | Status: DC | PRN

## 2016-04-02 MED ORDER — ENOXAPARIN SODIUM 40 MG/0.4ML ~~LOC~~ SOLN
40.0000 mg | SUBCUTANEOUS | Status: DC
  Filled 2016-04-02: qty 0.4

## 2016-04-02 MED ORDER — ROSUVASTATIN CALCIUM 10 MG PO TABS
10.0000 mg | ORAL_TABLET | ORAL | Status: DC
  Administered 2016-04-04: 10 mg via ORAL
  Filled 2016-04-02: qty 1

## 2016-04-02 MED ORDER — INSULIN ASPART 100 UNIT/ML ~~LOC~~ SOLN
0.0000 [IU] | Freq: Every day | SUBCUTANEOUS | Status: DC
  Administered 2016-04-02: 5 [IU] via SUBCUTANEOUS

## 2016-04-02 MED ORDER — FOLIC ACID 1 MG PO TABS
1.0000 mg | ORAL_TABLET | Freq: Two times a day (BID) | ORAL | Status: DC
Start: 2016-04-02 — End: 2016-04-09
  Administered 2016-04-02 – 2016-04-09 (×15): 1 mg via ORAL
  Filled 2016-04-02 (×15): qty 1

## 2016-04-02 MED ORDER — SODIUM CHLORIDE 0.9% FLUSH
3.0000 mL | Freq: Two times a day (BID) | INTRAVENOUS | Status: DC
  Administered 2016-04-02 – 2016-04-09 (×10): 3 mL via INTRAVENOUS

## 2016-04-02 MED ORDER — IPRATROPIUM-ALBUTEROL 0.5-2.5 (3) MG/3ML IN SOLN
3.0000 mL | Freq: Once | RESPIRATORY_TRACT | Status: AC
  Administered 2016-04-02: 3 mL via RESPIRATORY_TRACT
  Filled 2016-04-02: qty 3

## 2016-04-02 MED ORDER — ACETAMINOPHEN 325 MG PO TABS
650.0000 mg | ORAL_TABLET | Freq: Four times a day (QID) | ORAL | Status: DC | PRN
  Administered 2016-04-07 – 2016-04-08 (×2): 650 mg via ORAL
  Filled 2016-04-02 (×2): qty 2

## 2016-04-02 MED ORDER — LORAZEPAM 2 MG/ML IJ SOLN
0.5000 mg | Freq: Two times a day (BID) | INTRAMUSCULAR | Status: DC | PRN
  Administered 2016-04-02: 0.5 mg via INTRAVENOUS
  Filled 2016-04-02: qty 1

## 2016-04-02 MED ORDER — OSELTAMIVIR PHOSPHATE 30 MG PO CAPS
30.0000 mg | ORAL_CAPSULE | Freq: Two times a day (BID) | ORAL | Status: AC
  Administered 2016-04-02: 30 mg via ORAL
  Filled 2016-04-02 (×2): qty 1

## 2016-04-02 MED ORDER — OSELTAMIVIR PHOSPHATE 75 MG PO CAPS
75.0000 mg | ORAL_CAPSULE | Freq: Two times a day (BID) | ORAL | Status: DC
  Filled 2016-04-02: qty 1

## 2016-04-02 MED ORDER — ONDANSETRON HCL 4 MG/2ML IJ SOLN
4.0000 mg | Freq: Four times a day (QID) | INTRAMUSCULAR | Status: DC | PRN
Start: 2016-04-02 — End: 2016-04-09

## 2016-04-02 MED ORDER — BACLOFEN 5 MG HALF TABLET: 2.5000 mg | ORAL_TABLET | Freq: Two times a day (BID) | ORAL | Status: DC | PRN

## 2016-04-02 MED ORDER — PANTOPRAZOLE SODIUM 40 MG PO TBEC
40.0000 mg | DELAYED_RELEASE_TABLET | Freq: Every day | ORAL | Status: DC
  Administered 2016-04-02 – 2016-04-03 (×2): 40 mg via ORAL
  Filled 2016-04-02 (×2): qty 1

## 2016-04-02 MED ORDER — METHYLPREDNISOLONE SODIUM SUCC 125 MG IJ SOLR
60.0000 mg | Freq: Four times a day (QID) | INTRAMUSCULAR | Status: DC
Start: 2016-04-02 — End: 2016-04-04
  Administered 2016-04-02 – 2016-04-04 (×9): 60 mg via INTRAVENOUS
  Filled 2016-04-02 (×9): qty 2

## 2016-04-02 MED ORDER — BENZONATATE 100 MG PO CAPS
100.0000 mg | ORAL_CAPSULE | Freq: Two times a day (BID) | ORAL | Status: DC | PRN
  Administered 2016-04-03 – 2016-04-05 (×2): 100 mg via ORAL
  Filled 2016-04-02 (×2): qty 1

## 2016-04-02 MED ORDER — INSULIN ASPART 100 UNIT/ML ~~LOC~~ SOLN
0.0000 [IU] | Freq: Three times a day (TID) | SUBCUTANEOUS | Status: DC
  Administered 2016-04-02: 5 [IU] via SUBCUTANEOUS
  Administered 2016-04-02 – 2016-04-03 (×4): 11 [IU] via SUBCUTANEOUS
  Filled 2016-04-02 (×2): qty 1

## 2016-04-02 MED ORDER — ALBUTEROL SULFATE (2.5 MG/3ML) 0.083% IN NEBU
2.5000 mg | INHALATION_SOLUTION | RESPIRATORY_TRACT | Status: DC
  Administered 2016-04-02 – 2016-04-03 (×9): 2.5 mg via RESPIRATORY_TRACT
  Filled 2016-04-02 (×9): qty 3

## 2016-04-02 MED ORDER — DULOXETINE HCL 60 MG PO CPEP
60.0000 mg | ORAL_CAPSULE | Freq: Two times a day (BID) | ORAL | Status: DC
  Administered 2016-04-02 – 2016-04-09 (×15): 60 mg via ORAL
  Filled 2016-04-02 (×15): qty 1

## 2016-04-02 MED ORDER — METHYLPREDNISOLONE SODIUM SUCC 125 MG IJ SOLR
125.0000 mg | Freq: Once | INTRAMUSCULAR | Status: AC
  Administered 2016-04-02: 125 mg via INTRAVENOUS
  Filled 2016-04-02: qty 2

## 2016-04-02 MED ORDER — TORSEMIDE 20 MG PO TABS
60.0000 mg | ORAL_TABLET | Freq: Every day | ORAL | Status: DC
  Administered 2016-04-02: 60 mg via ORAL
  Administered 2016-04-03: 40 mg via ORAL
  Administered 2016-04-04 – 2016-04-09 (×6): 60 mg via ORAL
  Filled 2016-04-02 (×8): qty 3

## 2016-04-02 MED ORDER — ONDANSETRON HCL 4 MG PO TABS: 4.0000 mg | ORAL_TABLET | Freq: Four times a day (QID) | ORAL | Status: DC | PRN

## 2016-04-02 MED ORDER — FENOFIBRATE 54 MG PO TABS
54.0000 mg | ORAL_TABLET | Freq: Every day | ORAL | Status: DC
  Administered 2016-04-02 – 2016-04-09 (×8): 54 mg via ORAL
  Filled 2016-04-02 (×8): qty 1

## 2016-04-02 MED ORDER — ACETAMINOPHEN 650 MG RE SUPP: 650.0000 mg | Freq: Four times a day (QID) | RECTAL | Status: DC | PRN

## 2016-04-02 MED ORDER — GUAIFENESIN-CODEINE 100-10 MG/5ML PO SOLN
10.0000 mL | Freq: Four times a day (QID) | ORAL | Status: DC | PRN
  Administered 2016-04-02 – 2016-04-06 (×5): 10 mL via ORAL
  Filled 2016-04-02 (×5): qty 10

## 2016-04-02 MED ORDER — TRAZODONE HCL 50 MG PO TABS
50.0000 mg | ORAL_TABLET | Freq: Every evening | ORAL | Status: DC | PRN
  Administered 2016-04-02 – 2016-04-03 (×2): 50 mg via ORAL
  Filled 2016-04-02 (×2): qty 1

## 2016-04-02 MED ORDER — SODIUM CHLORIDE 0.9 % IV SOLN
INTRAVENOUS | Status: AC
  Administered 2016-04-02: 15:00:00 via INTRAVENOUS

## 2016-04-02 MED ORDER — POTASSIUM CHLORIDE CRYS ER 20 MEQ PO TBCR
30.0000 meq | EXTENDED_RELEASE_TABLET | Freq: Every day | ORAL | Status: DC
  Administered 2016-04-02 – 2016-04-03 (×2): 30 meq via ORAL
  Filled 2016-04-02: qty 1
  Filled 2016-04-02: qty 2

## 2016-04-02 MED ORDER — ROPINIROLE HCL 1 MG PO TABS
4.0000 mg | ORAL_TABLET | Freq: Two times a day (BID) | ORAL | Status: DC
  Administered 2016-04-02 – 2016-04-09 (×15): 4 mg via ORAL
  Filled 2016-04-02 (×16): qty 4

## 2016-04-02 MED ORDER — MUPIROCIN 2 % EX OINT
1.0000 "application " | TOPICAL_OINTMENT | Freq: Two times a day (BID) | CUTANEOUS | Status: AC
  Administered 2016-04-03 – 2016-04-07 (×10): 1 via NASAL
  Filled 2016-04-02 (×4): qty 22

## 2016-04-02 MED ORDER — ONDANSETRON 4 MG PO TBDP
4.0000 mg | ORAL_TABLET | Freq: Three times a day (TID) | ORAL | Status: DC | PRN
  Administered 2016-04-06 – 2016-04-07 (×2): 4 mg via ORAL
  Filled 2016-04-02 (×3): qty 1

## 2016-04-02 MED ORDER — BACLOFEN 1 MG/ML ORAL SUSPENSION
2.5000 mg | Freq: Two times a day (BID) | ORAL | Status: DC | PRN
  Filled 2016-04-02: qty 0.25

## 2016-04-02 MED ORDER — GABAPENTIN 300 MG PO CAPS
300.0000 mg | ORAL_CAPSULE | Freq: Every day | ORAL | Status: DC
  Administered 2016-04-02 – 2016-04-08 (×7): 300 mg via ORAL
  Filled 2016-04-02 (×7): qty 1

## 2016-04-02 MED ORDER — ALUM HYDROXIDE-MAG CARBONATE 95-358 MG/15ML PO SUSP
15.0000 mL | ORAL | Status: DC | PRN
  Filled 2016-04-02: qty 15

## 2016-04-02 MED ORDER — ESTRADIOL 2 MG PO TABS
2.0000 mg | ORAL_TABLET | Freq: Every day | ORAL | Status: DC
  Administered 2016-04-02 – 2016-04-09 (×8): 2 mg via ORAL
  Filled 2016-04-02 (×8): qty 1

## 2016-04-02 MED ORDER — IPRATROPIUM BROMIDE 0.02 % IN SOLN
0.5000 mg | RESPIRATORY_TRACT | Status: DC
  Administered 2016-04-02 – 2016-04-03 (×9): 0.5 mg via RESPIRATORY_TRACT
  Filled 2016-04-02 (×10): qty 2.5

## 2016-04-02 MED ORDER — CHLORHEXIDINE GLUCONATE CLOTH 2 % EX PADS
6.0000 | MEDICATED_PAD | Freq: Every day | CUTANEOUS | Status: AC
  Administered 2016-04-03 – 2016-04-07 (×5): 6 via TOPICAL

## 2016-04-02 NOTE — ED Provider Notes (Signed)
[facility] DEPT Provider Note   CSN: [id number] Arrival date & time: 04/02/16  0121   By signing my name below, I, Delton Prairie, attest that this documentation has been prepared under the direction and in the presence of Delora Fuel, MD  Electronically Signed: Delton Prairie, ED Scribe. 04/02/16. 3:21 AM.   History   Chief Complaint Chief Complaint  Patient presents with  . Shortness of Breath   The history is provided by the patient. No language interpreter was used.   HPI Comments:  Kathleen Hatfield is a 61 y.o. female, with a hx of CHF, DM and hyperlipidemia, who presents to the Emergency Department complaining of worsening SOB x 4 days. She also reports a productive cough with dark green sputum, fevers (tmax 100.3) and body aches. She has used her nebulizer with temporary relief. Pt reports she was seen at Urgent Care on 03/29/16, was diagnosed with influenza A and started on Tamiflu. Pt also visited the ED yesterday for SOB, had a chest X-ray and was prescribed prednisone which she has taken with no relief. She notes she has been admittied to the hospital for breathing problems in the past.    Past Medical History:  Diagnosis Date  . Anemia   . Arthritis   . Asthma   . CHF (congestive heart failure) ([place]) 05/2014  . Chronic diastolic heart failure ([place])   . Depression   . Diabetes mellitus without complication ([place])   . Diverticulitis   . Dyspnea    with exertion  . GERD (gastroesophageal reflux disease)   . History of blood transfusion   . Hyperlipidemia    cannot tolerate statins  . Hypertension   . Peripheral neuropathy ([place])   . Pneumonia   . PONV (postoperative nausea and vomiting)   . Sleep apnea    uses CPAP  . Ulcerative colitis [facility])    dr Collene Mares    Patient Active Problem List   Diagnosis Date Noted  . Solitary pulmonary nodule 11/20/2015  . Urinary retention 11/18/2015  . Hypoxemia 11/18/2015  . Incisional hernia s/p lap repair with mesh 11/15/2015  11/15/2015  . Hypersomnia with sleep apnea 09/16/2015  . Fatigue 06/07/2015  . Morbid obesity ([place]) 06/07/2015  . Cough 04/18/2015  . Folliculitis 83/66/2947  . Chronic diastolic heart failure ([place])   . OSA (obstructive sleep apnea) 08/23/2012  . Hypertension   . Depression   . GERD (gastroesophageal reflux disease)   . Hyperlipidemia   . Mild persistent chronic asthma without complication   . Dyspnea   . Ulcerative colitis ([place])   . Patellar tendinitis 05/25/2011  . Knee pain 05/25/2011  . Myofascial pain 05/25/2011  . Cervicalgia 05/25/2011    Past Surgical History:  Procedure Laterality Date  . ABDOMINAL HYSTERECTOMY    . APPENDECTOMY    . BREAST SURGERY     left biopsy  . CARDIAC CATHETERIZATION    . CESAREAN SECTION    . CHOLECYSTECTOMY    . HERNIA REPAIR    . INSERTION OF MESH N/A 11/15/2015   Procedure: INSERTION OF MESH;  Surgeon: [name], MD;  Location: [facility] ORS;  Service: General;  Laterality: N/A;  . LAPAROSCOPIC LYSIS OF ADHESIONS N/A 11/15/2015   Procedure: LAPAROSCOPIC LYSIS OF ADHESIONS;  Surgeon: [name], MD;  Location: [facility] ORS;  Service: General;  Laterality: N/A;  . RIGHT HEART CATHETERIZATION N/A 02/27/2013   Procedure: RIGHT HEART CATH;  Surgeon: Blane Ohara, MD;  Location: [facility] LAB;  Service: Cardiovascular;  Laterality: N/A;  . RIGHT HEART CATHETERIZATION N/A 12/14/2013   Procedure: RIGHT HEART CATH;  Surgeon: Larey Dresser, MD;  Location: [facility] CATH LAB;  Service: Cardiovascular;  Laterality: N/A;  . SIGMOIDECTOMY  2010   diverticular disease  . VENTRAL HERNIA REPAIR N/A 11/15/2015   Procedure: LAPAROSCOPIC VENTRAL WALL HERNIA REPAIR;  Surgeon: [name], MD;  Location: [facility] ORS;  Service: General;  Laterality: N/A;    OB History    No data available       Home Medications    Prior to Admission medications   Medication Sig Start Date End Date Taking? Authorizing Provider  acetaminophen (TYLENOL) 500 MG tablet Take 500 mg by mouth  every 4 (four) hours as needed (pain).    Yes Historical Provider, MD  ADVAIR HFA 115-21 MCG/ACT inhaler INHALE 2 PUFFS INTO THE LUNGS 2 TIMES DAILY. 03/13/16  Yes Tanda Rockers, MD  albuterol (PROVENTIL) (2.5 MG/3ML) 0.083% nebulizer solution Take 3 mLs (2.5 mg total) by nebulization every 4 (four) hours as needed for wheezing. 08/26/15  Yes Tanda Rockers, MD  Alum Hydroxide-Mag Carbonate (GAVISCON PO) Take 2 tablets by mouth at bedtime as needed (reflux).    Yes Historical Provider, MD  AMITIZA 24 MCG capsule Take 1 capsule by mouth 2 (two) times daily as needed for constipation.  10/22/15  Yes Historical Provider, MD  baclofen (LIORESAL) 5 mg TABS tablet Take 2.5 mg by mouth 2 (two) times daily as needed for muscle spasms.  03/27/13  Yes Dixon Boos, MD  Cholecalciferol (VITAMIN D-3) 5000 UNITS TABS Take 5,000 Units by mouth daily.    Yes Historical Provider, MD  DULoxetine (CYMBALTA) 60 MG capsule Take 60 mg by mouth 2 (two) times daily.    Yes Historical Provider, MD  estradiol (ESTRACE) 2 MG tablet Take 1 tablet (2 mg total) by mouth daily. 07/29/11  Yes Delsa Bern, MD  fenofibrate (TRICOR) 145 MG tablet Take 145 mg by mouth at bedtime.  09/28/14  Yes Historical Provider, MD  folic acid (FOLVITE) 1 MG tablet Take 1 mg by mouth 2 (two) times daily.   Yes Historical Provider, MD  gabapentin (NEURONTIN) 100 MG capsule Take 300 mg by mouth at bedtime.    Yes Historical Provider, MD  guaiFENesin-codeine 100-10 MG/5ML syrup Take 10 mLs by mouth every 6 (six) hours as needed for cough. 04/01/16  Yes Kristen N Ward, DO  Liraglutide (VICTOZA) 18 MG/3ML SOPN Inject 1.2 mg into the skin every evening.    Yes Historical Provider, MD  LORazepam (ATIVAN) 1 MG tablet Take 1 tablet by mouth at bedtime.    Yes Historical Provider, MD  metFORMIN (GLUCOPHAGE-XR) 750 MG 24 hr tablet Take 750 mg by mouth daily.    Yes Historical Provider, MD  ondansetron (ZOFRAN ODT) 4 MG disintegrating tablet Take 1 tablet (4  mg total) by mouth every 8 (eight) hours as needed for nausea or vomiting. 04/01/16  Yes Kristen N Ward, DO  oseltamivir (TAMIFLU) 75 MG capsule Take 75 mg by mouth 2 (two) times daily. 03/29/16  Yes Historical Provider, MD  pantoprazole (PROTONIX) 40 MG tablet Take 40 mg by mouth daily.  04/14/12  Yes Historical Provider, MD  potassium chloride (K-DUR,KLOR-CON) 10 MEQ tablet Take 30 mEq by mouth daily.  03/25/15  Yes Historical Provider, MD  predniSONE (DELTASONE) 20 MG tablet Take 3 tablets (60 mg total) by mouth daily. 04/01/16  Yes Kristen N Ward, DO  PROAIR HFA [phone] Base) MCG/ACT inhaler  INHALE 2 PUFFS INTO THE LUNGS EVERY FOUR HOURS AS NEEDED FOR WHEEZING. 11/21/15  Yes Tanda Rockers, MD  pseudoephedrine (SUDAFED) 120 MG 12 hr tablet Take 120 mg by mouth daily as needed for congestion.   Yes Historical Provider, MD  rOPINIRole (REQUIP) 4 MG tablet Take 4 mg by mouth 2 (two) times daily.    Yes Historical Provider, MD  rosuvastatin (CRESTOR) 10 MG tablet Take 10 mg by mouth once a week. On Saturday or Sunday 03/25/15  Yes Historical Provider, MD  torsemide (DEMADEX) 20 MG tablet TAKE 3 TABLETS DAILY Patient taking differently: Take 60 mg by mouth in the morning 12/24/14  Yes [name], MD  traMADol (ULTRAM) 50 MG tablet Take 1.5-2 tablets (75-100 mg total) by mouth every 6 (six) hours as needed for severe pain. 11/15/15  Yes [name], MD  traZODone (DESYREL) 50 MG tablet Take 50 mg by mouth at bedtime as needed for sleep.  06/19/15  Yes Historical Provider, MD  TRESIBA FLEXTOUCH 100 UNIT/ML SOPN Inject 15 Units into the skin at bedtime. Max of 60 units/day 06/19/15  Yes Historical Provider, MD  BAYER CONTOUR NEXT TEST test strip As directed 07/30/15   Historical Provider, MD  GLOBAL EASE INJECT PEN NEEDLES 32G X 4 MM [place]  06/19/15   Historical Provider, MD  [place] As directed 07/30/15   Historical Provider, MD  [place] into the lungs at bedtime. CPAP     Historical Provider, MD    Family History Family History  Problem Relation Age of Onset  . Heart disease Mother   . Hypertension Mother   . Dementia Mother   . Heart disease Father   . COPD Father   . Hypertension Father     Social History Social History  Substance Use Topics  . Smoking status: Never Smoker  . Smokeless tobacco: Never Used  . Alcohol use No     Allergies   Almond oil; Ceclor [cefaclor]; Ciprofloxacin; Levaquin [levofloxacin]; Morphine and related; and Sulfa antibiotics   Review of Systems Review of Systems  Constitutional: Positive for fever.  Respiratory: Positive for cough, shortness of breath and wheezing.   Musculoskeletal: Positive for myalgias.  All other systems reviewed and are negative.  Physical Exam Updated Vital Signs BP 139/71   Pulse 88   Temp 98.8 F (37.1 C) (Oral)   Resp 19   SpO2 92%   Physical Exam  Constitutional: She is oriented to person, place, and time. She appears well-developed and well-nourished.  HENT:  Head: Normocephalic and atraumatic.  Eyes: EOM are normal. Pupils are equal, round, and reactive to light.  Neck: Normal range of motion. Neck supple. No JVD present.  Cardiovascular: Normal rate, regular rhythm and normal heart sounds.   No murmur heard. Pulmonary/Chest: Effort normal. She has wheezes. She has no rales. She exhibits no tenderness.  Slightly decreased airflow diffusely. Mild diffuse inspiratory and expiratory wheezing.  Abdominal: Soft. Bowel sounds are normal. She exhibits no distension and no mass. There is no tenderness.  Musculoskeletal: Normal range of motion. She exhibits no edema.  Lymphadenopathy:    She has no cervical adenopathy.  Neurological: She is alert and oriented to person, place, and time. No cranial nerve deficit. She exhibits normal muscle tone. Coordination normal.  Skin: Skin is warm and dry. No rash noted.  Psychiatric: She has a normal mood and affect. Her behavior is  normal. Judgment and thought content normal.  Nursing note and vitals reviewed.  ED Treatments / Results  DIAGNOSTIC STUDIES:  Oxygen Saturation is 92% on RA, low by my interpretation.    COORDINATION OF CARE:  3:20 AM Discussed treatment plan with pt at bedside and pt agreed to plan.  Labs (all labs ordered are listed, but only abnormal results are displayed) Labs Reviewed  BASIC METABOLIC PANEL - Abnormal; Notable for the following:       Result Value   Chloride 95 (*)    Glucose, Bld 352 (*)    Creatinine, Ser 1.41 (*)    GFR calc non Af Amer 39 (*)    GFR calc Af Amer 46 (*)    Anion gap 18 (*)    All other components within normal limits  CBC - Abnormal; Notable for the following:    WBC 13.7 (*)    Hemoglobin 11.5 (*)    HCT 35.6 (*)    MCV 76.2 (*)    MCH 24.6 (*)    RDW 17.6 (*)    Platelets 414 (*)    All other components within normal limits  DIFFERENTIAL - Abnormal; Notable for the following:    Neutro Abs 9.5 (*)    Monocytes Absolute 1.3 (*)    All other components within normal limits    EKG  EKG Interpretation  Date/Time:  Thursday April 02 2016 01:25:52 EST Ventricular Rate:  90 PR Interval:  186 QRS Duration: 112 QT Interval:  410 QTC Calculation: 501 R Axis:   58 Text Interpretation:  Normal sinus rhythm Right bundle branch block Cannot rule out Anterior infarct , age undetermined Abnormal ECG When compared with ECG of 10/21/2015,  QT has lengthened Confirmed by Roxanne Mins  MD, Shelie Lansing ([id number]) on 04/02/2016 3:09:47 AM       Radiology Dg Chest 2 View  Result Date: 03/31/2016 CLINICAL DATA:  60 year old female with shortness of breath and productive cough. EXAM: CHEST  2 VIEW COMPARISON:  Chest radiograph dated 11/19/2015 FINDINGS: There is mild chronic appearing interstitial coarsening with probable mild bronchiectatic changes in the lung bases. There is no focal consolidation, pleural effusion, or pneumothorax. Stable top-normal cardiac size. No  acute osseous pathology. IMPRESSION: No active cardiopulmonary disease. Electronically Signed   By: Anner Crete M.D.   On: 03/31/2016 21:17    Procedures Procedures (including critical care time)  Medications Ordered in ED Medications  methylPREDNISolone sodium succinate (SOLU-MEDROL) 125 mg/2 mL injection 125 mg (not administered)  ipratropium-albuterol (DUONEB) 0.5-2.5 (3) MG/3ML nebulizer solution 3 mL (3 mLs Nebulization Given 04/02/16 0341)  ipratropium-albuterol (DUONEB) 0.5-2.5 (3) MG/3ML nebulizer solution 3 mL (3 mLs Nebulization Given 04/02/16 0419)     Initial Impression / Assessment and Plan / ED Course  I have reviewed the triage vital signs and the nursing notes.  Pertinent labs & imaging results that were available during my care of the patient were reviewed by me and considered in my medical decision making (see chart for details).  Ongoing bronchospasm and cough in the setting of respiratory tract infection-possible influenza, possible bronchitis or pneumonia. Chest x-rays obtained showing no evidence of pneumonia. Old records were reviewed showing an ED visit yesterday and discharged home improved with a prescription for prednisone which patient states she has been taking. In the ED, she is given additional albuterol with ipratropium nebulizer treatments with no improvement. Given failure to respond to appropriate outpatient management, decision was made to admit her. She was given a dose of methylprednisolone. Case is discussed with Dr. Alcario Drought of [facility] who agrees  to admit the patient under observation status.  Final Clinical Impressions(s) / ED Diagnoses   Final diagnoses:  Acute bronchitis, unspecified organism  Renal insufficiency  Microcytic anemia    New Prescriptions New Prescriptions   No medications on file   I personally performed the services described in this documentation, which was scribed in my presence. The recorded information has  been reviewed and is accurate.       Delora Fuel, MD [id number]

## 2016-04-02 NOTE — H&P (Signed)
History and Physical    Kathleen Hatfield GGE:[id number] DOB: 07-28-1955 DOA: 04/02/2016  PCP: Ann Held, MD Patient coming from: home  Chief Complaint: dyspnea  HPI: Kathleen Hatfield is a 61 y.o. female with medical history significant for obesity small l airway disease. Hypertension, diabetes, anxiety, back pain that C the emergency department with chief complaint of persistent worsening shortness of breath and cough. Initial evaluation reveals patient acute respiratory distress likely related to a COPD exacerbation in the setting of influenza.  Formation is obtained from the patient. Reports for 7 days she has had gradual worsening shortness of breath cough. 7 days ago symptoms started she uses her home inhalers with little improvement. 4 days ago she had worsening shortness of breath with a productive cough subjective fever generalized body aches. She went to urgent care tests and positive for influenza was given Tamiflu. She went home and reports the symptoms did not improve. She was in the emergency department yesterday for shortness of breath and was given oral prednisone as well as nebulizer treatments. She came back last night with worsening anxiety related to her persistent shortness of breath and worsening cough. She denies headache dizziness syncope near-syncope. She denies chest pain palpitations lower extremity edema. She denies abdominal pain nausea vomiting diarrhea constipation melena. She denies dysuria hematuria frequency or urgency    ED Course: The emergency department she's afebrile hemodynamically stable and not hypoxic. She does have increased oxygen demand provided with oxygen supplementation nebulizer treatments. He is nontoxic appearing but quite anxious  Review of Systems: As per HPI otherwise 10 point review of systems negative.   Ambulatory Status: Ambulates independently with a steady gait no recent falls  Past Medical History:  Diagnosis Date  . Anemia   . Arthritis     . Asthma   . CHF (congestive heart failure) ([place]) 05/2014  . Chronic diastolic heart failure ([place])   . CKD (chronic kidney disease)   . Depression   . Diabetes mellitus without complication ([place])   . Diverticulitis   . Dyspnea    with exertion  . GERD (gastroesophageal reflux disease)   . History of blood transfusion   . Hyperlipidemia    cannot tolerate statins  . Hypertension   . Peripheral neuropathy ([place])   . Pneumonia   . PONV (postoperative nausea and vomiting)   . Sleep apnea    uses CPAP  . Ulcerative colitis ([place])    dr Collene Mares    Past Surgical History:  Procedure Laterality Date  . ABDOMINAL HYSTERECTOMY    . APPENDECTOMY    . BREAST SURGERY     left biopsy  . CARDIAC CATHETERIZATION    . CESAREAN SECTION    . CHOLECYSTECTOMY    . HERNIA REPAIR    . INSERTION OF MESH N/A 11/15/2015   Procedure: INSERTION OF MESH;  Surgeon: [name], MD;  Location: [facility] OR[facility];  Service: General;  Laterality: N/A;  . LAPAROSCOPIC LYSIS OF ADHESIONS N/A 11/15/2015   Procedure: LAPAROSCOPIC LYSIS OF ADHESIONS;  Surgeon: [name], MD;  Location: [facility];  Service: General;  Laterality: N/A;  . RIGHT HEART CATHETERIZATION N/A 02/27/2013   Procedure: RIGHT HEART CATH;  Surgeon: Blane Ohara, MD;  Location: [facility] CATH LAB;  Service: Cardiovascular;  Laterality: N/A;  . RIGHT HEART CATHETERIZATION N/A 12/14/2013   Procedure: RIGHT HEART CATH;  Surgeon: Larey Dresser, MD;  Location: [facility] CATH LAB;  Service: Cardiovascular;  Laterality: N/A;  . SIGMOIDECTOMY  2010  diverticular disease  . VENTRAL HERNIA REPAIR N/A 11/15/2015   Procedure: LAPAROSCOPIC VENTRAL WALL HERNIA REPAIR;  Surgeon: [name], MD;  Location: [facility];  Service: General;  Laterality: N/A;    Social History   Social History  . Marital status: Single    Spouse name: N/A  . Number of children: N/A  . Years of education: N/A   Occupational History  . unemployed    Social History Main Topics  . Smoking status:  Never Smoker  . Smokeless tobacco: Never Used  . Alcohol use No  . Drug use: No  . Sexual activity: Not on file   Other Topics Concern  . Not on file   Social History Narrative  . No narrative on file    Allergies  Allergen Reactions  . Almond Oil Anaphylaxis  . Ceclor [Cefaclor] Nausea And Vomiting  . Ciprofloxacin Other (See Comments)    Makes joints and muscles ache  . Levaquin [Levofloxacin] Other (See Comments)    Body aches  . Morphine And Related     Pt. States while in the hospital it affected her breathing, O2 dropped to the 70's  . Sulfa Antibiotics Nausea And Vomiting    Family History  Problem Relation Age of Onset  . Heart disease Mother   . Hypertension Mother   . Dementia Mother   . Heart disease Father   . COPD Father   . Hypertension Father     Prior to Admission medications   Medication Sig Start Date End Date Taking? Authorizing Provider  acetaminophen (TYLENOL) 500 MG tablet Take 500 mg by mouth every 4 (four) hours as needed (pain).    Yes Historical Provider, MD  ADVAIR HFA 115-21 MCG/ACT inhaler INHALE 2 PUFFS INTO THE LUNGS 2 TIMES DAILY. 03/13/16  Yes Tanda Rockers, MD  albuterol (PROVENTIL) (2.5 MG/3ML) 0.083% nebulizer solution Take 3 mLs (2.5 mg total) by nebulization every 4 (four) hours as needed for wheezing. 08/26/15  Yes Tanda Rockers, MD  Alum Hydroxide-Mag Carbonate (GAVISCON PO) Take 2 tablets by mouth at bedtime as needed (reflux).    Yes Historical Provider, MD  AMITIZA 24 MCG capsule Take 1 capsule by mouth 2 (two) times daily as needed for constipation.  10/22/15  Yes Historical Provider, MD  baclofen (LIORESAL) 5 mg TABS tablet Take 2.5 mg by mouth 2 (two) times daily as needed for muscle spasms.  03/27/13  Yes Dixon Boos, MD  Cholecalciferol (VITAMIN D-3) 5000 UNITS TABS Take 5,000 Units by mouth daily.    Yes Historical Provider, MD  DULoxetine (CYMBALTA) 60 MG capsule Take 60 mg by mouth 2 (two) times daily.    Yes  Historical Provider, MD  estradiol (ESTRACE) 2 MG tablet Take 1 tablet (2 mg total) by mouth daily. 07/29/11  Yes Delsa Bern, MD  fenofibrate (TRICOR) 145 MG tablet Take 145 mg by mouth at bedtime.  09/28/14  Yes Historical Provider, MD  folic acid (FOLVITE) 1 MG tablet Take 1 mg by mouth 2 (two) times daily.   Yes Historical Provider, MD  gabapentin (NEURONTIN) 100 MG capsule Take 300 mg by mouth at bedtime.    Yes Historical Provider, MD  guaiFENesin-codeine 100-10 MG/5ML syrup Take 10 mLs by mouth every 6 (six) hours as needed for cough. 04/01/16  Yes Kristen N Ward, DO  Liraglutide (VICTOZA) 18 MG/3ML SOPN Inject 1.2 mg into the skin every evening.    Yes Historical Provider, MD  LORazepam (ATIVAN) 1 MG tablet Take  1 tablet by mouth at bedtime.    Yes Historical Provider, MD  metFORMIN (GLUCOPHAGE-XR) 750 MG 24 hr tablet Take 750 mg by mouth daily.    Yes Historical Provider, MD  ondansetron (ZOFRAN ODT) 4 MG disintegrating tablet Take 1 tablet (4 mg total) by mouth every 8 (eight) hours as needed for nausea or vomiting. 04/01/16  Yes Kristen N Ward, DO  oseltamivir (TAMIFLU) 75 MG capsule Take 75 mg by mouth 2 (two) times daily. 03/29/16  Yes Historical Provider, MD  pantoprazole (PROTONIX) 40 MG tablet Take 40 mg by mouth daily.  04/14/12  Yes Historical Provider, MD  potassium chloride (K-DUR,KLOR-CON) 10 MEQ tablet Take 30 mEq by mouth daily.  03/25/15  Yes Historical Provider, MD  predniSONE (DELTASONE) 20 MG tablet Take 3 tablets (60 mg total) by mouth daily. 04/01/16  Yes Kristen N Ward, DO  PROAIR HFA 108 (90 Base) MCG/ACT inhaler INHALE 2 PUFFS INTO THE LUNGS EVERY FOUR HOURS AS NEEDED FOR WHEEZING. 11/21/15  Yes Tanda Rockers, MD  pseudoephedrine (SUDAFED) 120 MG 12 hr tablet Take 120 mg by mouth daily as needed for congestion.   Yes Historical Provider, MD  rOPINIRole (REQUIP) 4 MG tablet Take 4 mg by mouth 2 (two) times daily.    Yes Historical Provider, MD  rosuvastatin (CRESTOR) 10 MG  tablet Take 10 mg by mouth once a week. On Saturday or Sunday 03/25/15  Yes Historical Provider, MD  torsemide (DEMADEX) 20 MG tablet TAKE 3 TABLETS DAILY Patient taking differently: Take 60 mg by mouth in the morning 12/24/14  Yes [name], MD  traMADol (ULTRAM) 50 MG tablet Take 1.5-2 tablets (75-100 mg total) by mouth every 6 (six) hours as needed for severe pain. 11/15/15  Yes [name], MD  traZODone (DESYREL) 50 MG tablet Take 50 mg by mouth at bedtime as needed for sleep.  06/19/15  Yes Historical Provider, MD  TRESIBA FLEXTOUCH 100 UNIT/ML SOPN Inject 15 Units into the skin at bedtime. Max of 60 units/day 06/19/15  Yes Historical Provider, MD  BAYER CONTOUR NEXT TEST test strip As directed 07/30/15   Historical Provider, MD  GLOBAL EASE INJECT PEN NEEDLES 32G X 4 MM [place]  06/19/15   Historical Provider, MD  [place] As directed 07/30/15   Historical Provider, MD  [place] into the lungs at bedtime. CPAP    Historical Provider, MD    Physical Exam: Vitals:   04/02/16 0415 04/02/16 0515 04/02/16 0530 04/02/16 0715  BP: 124/60 143/63 136/63 131/62  Pulse: 80 92 92   Resp:   22   Temp:      TempSrc:      SpO2: 92% 93% 93%      General:  Appears What anxious sitting on the side of the bed but not uncomfortable Eyes:  PERRL, EOMI, normal lids, iris ENT:  grossly normal hearing, lips & tongue, his membranes of the mouth are pink slightly dry Neck:  no LAD, masses or thyromegaly Cardiovascular:  RRR, no m/r/g. No LE edema.  Respiratory:  Mild to moderate increased work of breathing with conversation. Fair air flow some diminished breath sounds in the bases. Diffuse expiratory wheezing no crackles Abdomen:  soft, ntnd, obese positive bowel sounds no guarding or rebounding Skin:  no rash or induration seen on limited exam Musculoskeletal:  grossly normal tone BUE/BLE, good ROM, no bony abnormality Psychiatric:  grossly normal mood and affect, speech  fluent and appropriate, AOx3 Neurologic:  CN 2-12  grossly intact, moves all extremities in coordinated fashion, sensation intact. Speech clear facial symmetry  Labs on Admission: I have personally reviewed following labs and imaging studies  CBC:  Recent Labs Lab 03/31/16 2015 04/02/16 0141  WBC 8.1 13.7*  NEUTROABS 4.0 9.5*  HGB 12.0 11.5*  HCT 37.7 35.6*  MCV 78.2 76.2*  PLT 406* 732*   Basic Metabolic Panel:  Recent Labs Lab 03/31/16 2015 04/02/16 0141  NA 139 135  K 3.3* 3.9  CL 100* 95*  CO2 28 22  GLUCOSE 169* 352*  BUN 13 20  CREATININE 1.37* 1.41*  CALCIUM 9.3 9.3   GFR: CrCl cannot be calculated (Unknown ideal weight.). Liver Function Tests:  Recent Labs Lab 03/31/16 2015  AST 42*  ALT 50  ALKPHOS 51  BILITOT 0.2*  PROT 7.6  ALBUMIN 3.7   No results for input(s): LIPASE, AMYLASE in the last 168 hours. No results for input(s): AMMONIA in the last 168 hours. Coagulation Profile: No results for input(s): INR, PROTIME in the last 168 hours. Cardiac Enzymes: No results for input(s): CKTOTAL, CKMB, CKMBINDEX, TROPONINI in the last 168 hours. BNP (last 3 results)  Recent Labs  04/15/15 1545  PROBNP 29.0   HbA1C: No results for input(s): HGBA1C in the last 72 hours. CBG: No results for input(s): GLUCAP in the last 168 hours. Lipid Profile: No results for input(s): CHOL, HDL, LDLCALC, TRIG, CHOLHDL, LDLDIRECT in the last 72 hours. Thyroid Function Tests: No results for input(s): TSH, T4TOTAL, FREET4, T3FREE, THYROIDAB in the last 72 hours. Anemia Panel: No results for input(s): VITAMINB12, FOLATE, FERRITIN, TIBC, IRON, RETICCTPCT in the last 72 hours. Urine analysis:    Component Value Date/Time   COLORURINE YELLOW 04/14/2015 1501   APPEARANCEUR CLEAR 04/14/2015 1501   LABSPEC 1.021 04/14/2015 1501   PHURINE 7.5 04/14/2015 1501   GLUCOSEU NEGATIVE 04/14/2015 1501   HGBUR NEGATIVE 04/14/2015 1501   BILIRUBINUR NEGATIVE 04/14/2015 1501    KETONESUR NEGATIVE 04/14/2015 1501   PROTEINUR NEGATIVE 04/14/2015 1501   UROBILINOGEN 0.2 09/19/2008 1400   NITRITE NEGATIVE 04/14/2015 1501   LEUKOCYTESUR NEGATIVE 04/14/2015 1501    Creatinine Clearance: CrCl cannot be calculated (Unknown ideal weight.).  Sepsis Labs: @LABRCNTIP (procalcitonin:4,lacticidven:4) )No results found for this or any previous visit (from the past 240 hour(s)).   Radiological Exams on Admission: Dg Chest 2 View  Result Date: 04/02/2016 CLINICAL DATA:  Cough and dyspnea.  Fever x1 week. EXAM: CHEST  2 VIEW COMPARISON:  03/31/2016 bold FINDINGS: Upper lobe predominant hyperinflation. Crowding of lower lobe interstitial lung markings. Minimal spurring along the periphery of the left lung base. Apical pleuroparenchymal scarring and thickening. Heart is borderline enlarged with aortic atherosclerosis. No aneurysm. No suspicious osseous lesions. No pneumothorax, effusion or pulmonary consolidation. IMPRESSION: No acute cardiopulmonary disease. Mild upper lobe predominant hyperinflation of the lungs with crowding of lower lobe interstitial lung markings. No alveolar consolidations to suggest pneumonia. No CHF Electronically Signed   By: Ashley Royalty M.D.   On: 04/02/2016 03:41   Dg Chest 2 View  Result Date: 03/31/2016 CLINICAL DATA:  61 year old female with shortness of breath and productive cough. EXAM: CHEST  2 VIEW COMPARISON:  Chest radiograph dated 11/19/2015 FINDINGS: There is mild chronic appearing interstitial coarsening with probable mild bronchiectatic changes in the lung bases. There is no focal consolidation, pleural effusion, or pneumothorax. Stable top-normal cardiac size. No acute osseous pathology. IMPRESSION: No active cardiopulmonary disease. Electronically Signed   By: Anner Crete M.D.   On: 03/31/2016  21:17    EKG: Independently reviewed. Normal sinus rhythm Right bundle branch block Cannot rule out Anterior infarct , age undetermined Abnormal ECG  When compared with ECG of 10/21/2015, QT has lengthened  Assessment/Plan Principal Problem:   Respiratory distress Active Problems:   Hypertension   Hyperlipidemia   Mild persistent chronic asthma without complication   Chronic diastolic heart failure (HCC)   Morbid obesity (HCC)   Acute bronchitis   Acute renal failure superimposed on stage 2 chronic kidney disease ([place])   #1. Acute respiratory distress likely related to bronchitis in the setting of small airway disease and influenza. Chest x-ray active cardiopulmonary disease. Patient does have an increased oxygen demand. She's not on oxygen at home. Tachypnea oxygen saturation level 91% -Admit to telemetry -Solu-Medrol 60 mg every 6 hours -Scheduled nebulizers -Continue oxygen supplementation -Antitussives -continue tamiflu -Wean oxygen as able  #2. Acute bronchitis in the setting of small airway disease/persistent chronic asthma, obstructive sleep apnea. Chart review indicates that with pulmonology months ago. Note indicates asthma stable -See #1 -C Pap -Minimize sedating drugs  #3. Acute renal failure superimposed on stage II chronic kidney disease. Creatinine 1.4. Home meds include Demadex -Gentle IV fluids -Hold nephrotoxins -Monitor urine output -Recheck in the morning  4. Chronic diastolic heart failure. Appears compensated. Echo 3 years ago reveals an EF of 65% and grade 2 diastolic dysfunction -Daily weights -Monitor intake and output  #5. Diabetes. Serum glucose 352 on admission. On oral agents at home. -We'll hold oral agents for now -Obtain a hemoglobin A1c -Sliding scale insulin for optimal control  #6. Hypertension. Controlled in the emergency department     DVT prophylaxis: lovenox  Code Status: full  Family Communication: none present  Disposition Plan: home  Consults called: none  Admission status: obs    Dyanne Carrel M MD [facility]  If 7PM-7AM, please contact  night-coverage [email or web address] Password [facility]  04/02/2016, 7:46 AM

## 2016-04-02 NOTE — ED Notes (Signed)
Report to [facility] on 3E. Pt to floor with ED tech.

## 2016-04-02 NOTE — Care Management Note (Addendum)
Case Management Note  Patient Details  Name: LASTACIA SOLUM MRN: [id number] Date of Birth: 03/28/1955  Subjective/Objective:                  From home with son. /60 y.o. female, with a hx of CHF, DM and hyperlipidemia, who presents to the Emergency Department complaining of worsening SOB.   Action/Plan: Admit status OBSERVATION (Acute respiratory distress); anticipate discharge [place].   Expected Discharge Date:   (unsure)               Expected Discharge Plan:  [place]  In-House Referral:  NA  Discharge planning Services  CM Consult  Post Acute Care Choice:    Choice offered to:     DME Arranged:    DME Agency:     HH Arranged:    HH Agency:     Status of Service:  In process, will continue to follow  If discussed at Long Length of Stay Meetings, dates discussed:    Additional Comments: Pt has used [facility] in the past.  Fuller Mandril, RN 04/02/2016, 10:00 AM

## 2016-04-02 NOTE — ED Notes (Signed)
Pt sitting in a recliner. Declines to get into stretcher.  Appears anxious.  Feels like she "can't breathe"

## 2016-04-02 NOTE — ED Notes (Signed)
Pt transported to xray 

## 2016-04-02 NOTE — ED Notes (Signed)
Pt went to restroom.  Partially in wheelchair, ambulated into bathroom from outside the door and from [place] back in to pt room.  Appears very short of breath.  02 saturation 94% on room air upon return to room.

## 2016-04-02 NOTE — ED Notes (Signed)
Up to BR with diarrhea

## 2016-04-02 NOTE — Progress Notes (Signed)
PT Cancellation Note  Patient Details Name: Kathleen Hatfield MRN: [id number] DOB: 13-Feb-1956   Cancelled Treatment:    Reason Eval/Treat Not Completed: Fatigue/lethargy limiting ability to participate. Pt's RN reported that pt is too lethargic secondary to Ativan and would benefit from holding PT evaluation until tomorrow. PT will continue to f/u with pt as appropriate.    [place] 04/02/2016, 4:30 PM

## 2016-04-02 NOTE — ED Notes (Signed)
Patient CBG was 330.

## 2016-04-02 NOTE — ED Triage Notes (Signed)
Pt here for sob  Seen Sunday and dx with flu started on tamiflu, states fearful that she is going to take her last breath. Appears sob

## 2016-04-03 ENCOUNTER — Inpatient Hospital Stay (HOSPITAL_COMMUNITY): Payer: Medicare HMO

## 2016-04-03 ENCOUNTER — Observation Stay (HOSPITAL_COMMUNITY): Payer: Medicare HMO

## 2016-04-03 DIAGNOSIS — I10 Essential (primary) hypertension: Secondary | ICD-10-CM | POA: Diagnosis not present

## 2016-04-03 DIAGNOSIS — Z79899 Other long term (current) drug therapy: Secondary | ICD-10-CM | POA: Diagnosis not present

## 2016-04-03 DIAGNOSIS — E1142 Type 2 diabetes mellitus with diabetic polyneuropathy: Secondary | ICD-10-CM | POA: Diagnosis present

## 2016-04-03 DIAGNOSIS — R0600 Dyspnea, unspecified: Secondary | ICD-10-CM | POA: Diagnosis not present

## 2016-04-03 DIAGNOSIS — N179 Acute kidney failure, unspecified: Secondary | ICD-10-CM | POA: Diagnosis not present

## 2016-04-03 DIAGNOSIS — N289 Disorder of kidney and ureter, unspecified: Secondary | ICD-10-CM | POA: Diagnosis not present

## 2016-04-03 DIAGNOSIS — E785 Hyperlipidemia, unspecified: Secondary | ICD-10-CM | POA: Diagnosis present

## 2016-04-03 DIAGNOSIS — K219 Gastro-esophageal reflux disease without esophagitis: Secondary | ICD-10-CM | POA: Diagnosis present

## 2016-04-03 DIAGNOSIS — R0902 Hypoxemia: Secondary | ICD-10-CM | POA: Diagnosis not present

## 2016-04-03 DIAGNOSIS — J45901 Unspecified asthma with (acute) exacerbation: Secondary | ICD-10-CM | POA: Diagnosis present

## 2016-04-03 DIAGNOSIS — R0602 Shortness of breath: Secondary | ICD-10-CM | POA: Diagnosis not present

## 2016-04-03 DIAGNOSIS — E1165 Type 2 diabetes mellitus with hyperglycemia: Secondary | ICD-10-CM | POA: Diagnosis not present

## 2016-04-03 DIAGNOSIS — M7989 Other specified soft tissue disorders: Secondary | ICD-10-CM | POA: Diagnosis not present

## 2016-04-03 DIAGNOSIS — N182 Chronic kidney disease, stage 2 (mild): Secondary | ICD-10-CM | POA: Diagnosis present

## 2016-04-03 DIAGNOSIS — G4733 Obstructive sleep apnea (adult) (pediatric): Secondary | ICD-10-CM | POA: Diagnosis not present

## 2016-04-03 DIAGNOSIS — F329 Major depressive disorder, single episode, unspecified: Secondary | ICD-10-CM | POA: Diagnosis present

## 2016-04-03 DIAGNOSIS — E119 Type 2 diabetes mellitus without complications: Secondary | ICD-10-CM | POA: Diagnosis not present

## 2016-04-03 DIAGNOSIS — R739 Hyperglycemia, unspecified: Secondary | ICD-10-CM | POA: Diagnosis not present

## 2016-04-03 DIAGNOSIS — J209 Acute bronchitis, unspecified: Secondary | ICD-10-CM | POA: Diagnosis not present

## 2016-04-03 DIAGNOSIS — J101 Influenza due to other identified influenza virus with other respiratory manifestations: Secondary | ICD-10-CM | POA: Diagnosis not present

## 2016-04-03 DIAGNOSIS — J9601 Acute respiratory failure with hypoxia: Secondary | ICD-10-CM | POA: Diagnosis present

## 2016-04-03 DIAGNOSIS — I130 Hypertensive heart and chronic kidney disease with heart failure and stage 1 through stage 4 chronic kidney disease, or unspecified chronic kidney disease: Secondary | ICD-10-CM | POA: Diagnosis present

## 2016-04-03 DIAGNOSIS — I5032 Chronic diastolic (congestive) heart failure: Secondary | ICD-10-CM | POA: Diagnosis not present

## 2016-04-03 DIAGNOSIS — T380X5A Adverse effect of glucocorticoids and synthetic analogues, initial encounter: Secondary | ICD-10-CM | POA: Diagnosis not present

## 2016-04-03 DIAGNOSIS — J4530 Mild persistent asthma, uncomplicated: Secondary | ICD-10-CM | POA: Diagnosis not present

## 2016-04-03 DIAGNOSIS — E1122 Type 2 diabetes mellitus with diabetic chronic kidney disease: Secondary | ICD-10-CM | POA: Diagnosis present

## 2016-04-03 DIAGNOSIS — F419 Anxiety disorder, unspecified: Secondary | ICD-10-CM | POA: Diagnosis present

## 2016-04-03 DIAGNOSIS — Z6841 Body Mass Index (BMI) 40.0 and over, adult: Secondary | ICD-10-CM | POA: Diagnosis not present

## 2016-04-03 LAB — CBC
HCT: 35.1 % — ABNORMAL LOW (ref 36.0–46.0)
Hemoglobin: 11.1 g/dL — ABNORMAL LOW (ref 12.0–15.0)
MCH: 24.3 pg — ABNORMAL LOW (ref 26.0–34.0)
MCHC: 31.6 g/dL (ref 30.0–36.0)
MCV: 76.8 fL — ABNORMAL LOW (ref 78.0–100.0)
Platelets: 412 10*3/uL — ABNORMAL HIGH (ref 150–400)
RBC: 4.57 MIL/uL (ref 3.87–5.11)
RDW: 17.7 % — ABNORMAL HIGH (ref 11.5–15.5)
WBC: 11.2 10*3/uL — ABNORMAL HIGH (ref 4.0–10.5)

## 2016-04-03 LAB — BASIC METABOLIC PANEL
Anion gap: 15 (ref 5–15)
BUN: 28 mg/dL — ABNORMAL HIGH (ref 6–20)
CO2: 27 mmol/L (ref 22–32)
Calcium: 9.2 mg/dL (ref 8.9–10.3)
Chloride: 94 mmol/L — ABNORMAL LOW (ref 101–111)
Creatinine, Ser: 1.4 mg/dL — ABNORMAL HIGH (ref 0.44–1.00)
GFR calc Af Amer: 46 mL/min — ABNORMAL LOW (ref >60)
GFR calc non Af Amer: 40 mL/min — ABNORMAL LOW (ref >60)
Glucose, Bld: 306 mg/dL — ABNORMAL HIGH (ref 65–99)
Potassium: 4.1 mmol/L (ref 3.5–5.1)
Sodium: 136 mmol/L (ref 135–145)

## 2016-04-03 LAB — TROPONIN I
Troponin I: 0.03 ng/mL (ref <0.03)
Troponin I: <0.03 ng/mL (ref <0.03)

## 2016-04-03 LAB — GLUCOSE, CAPILLARY
Glucose-Capillary: 341 mg/dL — ABNORMAL HIGH (ref 65–99)
Glucose-Capillary: 348 mg/dL — ABNORMAL HIGH (ref 65–99)
Glucose-Capillary: 263 mg/dL — ABNORMAL HIGH (ref 65–99)
Glucose-Capillary: 333 mg/dL — ABNORMAL HIGH (ref 65–99)

## 2016-04-03 LAB — PROTIME-INR
INR: 0.99
Prothrombin Time: 13.1 seconds (ref 11.4–15.2)

## 2016-04-03 LAB — HEMOGLOBIN A1C
Hgb A1c MFr Bld: 7.6 % — ABNORMAL HIGH (ref 4.8–5.6)
Mean Plasma Glucose: 171 mg/dL

## 2016-04-03 LAB — PROCALCITONIN: Procalcitonin: 0.13 ng/mL

## 2016-04-03 LAB — BRAIN NATRIURETIC PEPTIDE: B Natriuretic Peptide: 51.4 pg/mL (ref 0.0–100.0)

## 2016-04-03 MED ORDER — INSULIN ASPART 100 UNIT/ML ~~LOC~~ SOLN
0.0000 [IU] | Freq: Every day | SUBCUTANEOUS | Status: DC
Start: 2016-04-03 — End: 2016-04-09
  Administered 2016-04-03: 1 [IU] via SUBCUTANEOUS
  Administered 2016-04-04: 3 [IU] via SUBCUTANEOUS
  Administered 2016-04-05: 4 [IU] via SUBCUTANEOUS
  Administered 2016-04-06 – 2016-04-07 (×2): 5 [IU] via SUBCUTANEOUS
  Administered 2016-04-08: 3 [IU] via SUBCUTANEOUS

## 2016-04-03 MED ORDER — HEPARIN BOLUS VIA INFUSION
4500.0000 [IU] | Freq: Once | INTRAVENOUS | Status: AC
  Administered 2016-04-03: 4500 [IU] via INTRAVENOUS
  Filled 2016-04-03: qty 4500

## 2016-04-03 MED ORDER — IPRATROPIUM-ALBUTEROL 0.5-2.5 (3) MG/3ML IN SOLN
3.0000 mL | RESPIRATORY_TRACT | Status: DC
  Administered 2016-04-03 – 2016-04-05 (×8): 3 mL via RESPIRATORY_TRACT
  Filled 2016-04-03 (×9): qty 3

## 2016-04-03 MED ORDER — DOXYCYCLINE HYCLATE 100 MG PO TABS
100.0000 mg | ORAL_TABLET | Freq: Two times a day (BID) | ORAL | Status: DC
  Administered 2016-04-03 – 2016-04-09 (×13): 100 mg via ORAL
  Filled 2016-04-03 (×13): qty 1

## 2016-04-03 MED ORDER — FUROSEMIDE 10 MG/ML IJ SOLN
80.0000 mg | Freq: Once | INTRAMUSCULAR | Status: AC
  Administered 2016-04-03: 80 mg via INTRAVENOUS
  Filled 2016-04-03: qty 8

## 2016-04-03 MED ORDER — INSULIN ASPART 100 UNIT/ML ~~LOC~~ SOLN
6.0000 [IU] | Freq: Three times a day (TID) | SUBCUTANEOUS | Status: DC
Start: 2016-04-03 — End: 2016-04-03

## 2016-04-03 MED ORDER — INSULIN GLARGINE 100 UNIT/ML ~~LOC~~ SOLN
15.0000 [IU] | Freq: Every day | SUBCUTANEOUS | Status: DC
  Administered 2016-04-03 – 2016-04-04 (×2): 15 [IU] via SUBCUTANEOUS
  Filled 2016-04-03 (×2): qty 0.15

## 2016-04-03 MED ORDER — POTASSIUM CHLORIDE CRYS ER 10 MEQ PO TBCR
30.0000 meq | EXTENDED_RELEASE_TABLET | Freq: Every day | ORAL | Status: DC
  Administered 2016-04-04 – 2016-04-09 (×6): 30 meq via ORAL
  Filled 2016-04-03 (×6): qty 3

## 2016-04-03 MED ORDER — HEPARIN (PORCINE) IN NACL 100-0.45 UNIT/ML-% IJ SOLN
1600.0000 [IU]/h | INTRAMUSCULAR | Status: DC
  Administered 2016-04-03: 1350 [IU]/h via INTRAVENOUS
  Administered 2016-04-04 – 2016-04-06 (×4): 1600 [IU]/h via INTRAVENOUS
  Filled 2016-04-03 (×5): qty 250

## 2016-04-03 MED ORDER — DEXTROSE 5 % IV SOLN
2.0000 g | INTRAVENOUS | Status: DC
  Administered 2016-04-03: 2 g via INTRAVENOUS
  Filled 2016-04-03 (×2): qty 2

## 2016-04-03 MED ORDER — BUDESONIDE 0.25 MG/2ML IN SUSP
0.2500 mg | Freq: Two times a day (BID) | RESPIRATORY_TRACT | Status: DC
  Administered 2016-04-03 – 2016-04-06 (×6): 0.25 mg via RESPIRATORY_TRACT
  Filled 2016-04-03 (×6): qty 2

## 2016-04-03 MED ORDER — INSULIN ASPART 100 UNIT/ML ~~LOC~~ SOLN
0.0000 [IU] | Freq: Three times a day (TID) | SUBCUTANEOUS | Status: DC
  Administered 2016-04-03 – 2016-04-04 (×2): 11 [IU] via SUBCUTANEOUS
  Administered 2016-04-04: 20 [IU] via SUBCUTANEOUS
  Administered 2016-04-04 – 2016-04-05 (×3): 15 [IU] via SUBCUTANEOUS
  Administered 2016-04-05: 7 [IU] via SUBCUTANEOUS
  Administered 2016-04-06: 11 [IU] via SUBCUTANEOUS
  Administered 2016-04-06: 20 [IU] via SUBCUTANEOUS
  Administered 2016-04-06: 11 [IU] via SUBCUTANEOUS
  Administered 2016-04-07: 15 [IU] via SUBCUTANEOUS
  Administered 2016-04-07: 3 [IU] via SUBCUTANEOUS
  Administered 2016-04-07 – 2016-04-08 (×2): 11 [IU] via SUBCUTANEOUS
  Administered 2016-04-08: 15 [IU] via SUBCUTANEOUS
  Administered 2016-04-08: 20 [IU] via SUBCUTANEOUS
  Administered 2016-04-09 (×2): 15 [IU] via SUBCUTANEOUS

## 2016-04-03 MED ORDER — ORAL CARE MOUTH RINSE
15.0000 mL | Freq: Two times a day (BID) | OROMUCOSAL | Status: DC
  Administered 2016-04-04 – 2016-04-08 (×7): 15 mL via OROMUCOSAL

## 2016-04-03 MED ORDER — PANTOPRAZOLE SODIUM 40 MG PO TBEC
40.0000 mg | DELAYED_RELEASE_TABLET | Freq: Two times a day (BID) | ORAL | Status: DC
  Administered 2016-04-03 – 2016-04-04 (×2): 40 mg via ORAL
  Filled 2016-04-03 (×2): qty 1

## 2016-04-03 NOTE — Progress Notes (Signed)
PT Cancellation Note  Patient Details Name: Kathleen Hatfield MRN: [id number] DOB: Nov 14, 1955   Cancelled Treatment:    Reason Eval/Treat Not Completed: Medical issues which prohibited therapy. Pt with respiratory distress, requiring transfer to step down. PT will continue to f/u with pt as appropriate and available.    [place] 04/03/2016, 1:46 PM

## 2016-04-03 NOTE — Progress Notes (Signed)
OT Cancellation Note  Patient Details Name: Kathleen Hatfield MRN: [id number] DOB: 01/07/1956   Cancelled Treatment:    Reason Eval/Treat Not Completed: Medical issues which prohibited therapy. Pt with difficulty breathing, transferring to step down. Will follow.  Malka So 04/03/2016, 1:29 PM

## 2016-04-03 NOTE — Progress Notes (Signed)
Report called to [address] east and given to [place], [employer]

## 2016-04-03 NOTE — Progress Notes (Signed)
MD [name] called stated he will add oral antibiotics to patient medications and long acting insulin for her hyperglycemia.   Amire Leazer Leory Plowman

## 2016-04-03 NOTE — Progress Notes (Signed)
CRITICAL VALUE ALERT  Critical value received: Troponin 0.03 Date of notification:  04/03/16  Time of notification: 1700 Critical value read back:yes  Nurse who received alert:  Venetia Constable MD notified (1st page):  Dr. Curly Rim  Time of first page:  1717  MD notified (2nd page):  Time of second page:  Responding MD: Dr.Krishnan G Time MD responded:  [phone]

## 2016-04-03 NOTE — Progress Notes (Addendum)
[facility] PROGRESS NOTE  Kathleen Hatfield EZM:[id number] DOB: [birth year] DOA: 04/02/2016  PCP: Ann Held, MD  Brief History/Interval Summary: 61 year old Caucasian female with a past medical history of obesity, hypertension, small airway disease/asthma, obstructive sleep apnea, presented with complaints of shortness of breath and cough. Patient was recently diagnosed with the influenza and was started on Tamiflu. She is thought to have acute bronchitis.  Reason for Visit: Acute bronchitis  Consultants: None  Procedures: None  Antibiotics: Initiate doxycycline. Continue Tamiflu  Subjective/Interval History: Patient feels better this morning compared to yesterday. Does have some discomfort over her chest, especially with coughing and the breathing but none otherwise. Continues to have a cough.  ROS: Denies any nausea or vomiting  Objective:  Vital Signs  Vitals:   04/03/16 0827 04/03/16 0829 04/03/16 0830 04/03/16 1122  BP: (!) 145/62     Pulse: 78     Resp: 20 19    Temp: 98.1 F (36.7 C)     TempSrc: Oral     SpO2: (!) 81% 94% 94% (!) 87%  Weight:      Height:        Intake/Output Summary (Last 24 hours) at 04/03/16 1142 Last data filed at 04/03/16 1114  Gross per 24 hour  Intake           664.17 ml  Output             1200 ml  Net          -535.83 ml   Filed Weights   04/02/16 1527 04/03/16 0542  Weight: 122 kg (268 lb 14.4 oz) 120.6 kg (265 lb 12.8 oz)    General appearance: alert, cooperative, appears stated age and no distress Resp: Coarse breath sounds bilaterally. Scattered wheezing is appreciated. A few crackles at the bases. No rhonchi. Cardio: regular rate and rhythm, S1, S2 normal, no murmur, click, rub or gallop GI: soft, non-tender; bowel sounds normal; no masses,  no organomegaly Extremities: extremities normal, atraumatic, no cyanosis or edema Neurologic: No focal deficits  Lab Results:  Data Reviewed: I have personally reviewed  following labs and imaging studies  CBC:  Recent Labs Lab 03/31/16 2015 04/02/16 0141 04/03/16 0451  WBC 8.1 13.7* 11.2*  NEUTROABS 4.0 9.5*  --   HGB 12.0 11.5* 11.1*  HCT 37.7 35.6* 35.1*  MCV 78.2 76.2* 76.8*  PLT 406* 414* 412*    Basic Metabolic Panel:  Recent Labs Lab 03/31/16 2015 04/02/16 0141 04/03/16 0451  NA 139 135 136  K 3.3* 3.9 4.1  CL 100* 95* 94*  CO2 28 22 27   GLUCOSE 169* 352* 306*  BUN 13 20 28*  CREATININE 1.37* 1.41* 1.40*  CALCIUM 9.3 9.3 9.2    GFR: Estimated Creatinine Clearance: 54.9 mL/min (by C-G formula based on SCr of 1.4 mg/dL (H)).  Liver Function Tests:  Recent Labs Lab 03/31/16 2015  AST 42*  ALT 50  ALKPHOS 51  BILITOT 0.2*  PROT 7.6  ALBUMIN 3.7    HbA1C:  Recent Labs  04/02/16 0908  HGBA1C 7.6*    CBG:  Recent Labs Lab 04/02/16 1205 04/02/16 1531 04/02/16 2058 04/03/16 0550 04/03/16 1112  GLUCAP 330* 310* 363* 341* 348*     Recent Results (from the past 240 hour(s))  MRSA PCR Screening     Status: Abnormal   Collection Time: 04/02/16  3:51 PM  Result Value Ref Range Status   MRSA by PCR POSITIVE (A) NEGATIVE Final  Comment:        The GeneXpert MRSA Assay (FDA approved for NASAL specimens only), is one component of a comprehensive MRSA colonization surveillance program. It is not intended to diagnose MRSA infection nor to guide or monitor treatment for MRSA infections. RESULT CALLED TO, READ BACK BY AND VERIFIED WITHWilson Singer RN [id number] 04/02/16 A BROWNING       Radiology Studies: Dg Chest 2 View  Result Date: 04/02/2016 CLINICAL DATA:  Cough and dyspnea.  Fever x1 week. EXAM: CHEST  2 VIEW COMPARISON:  03/31/2016 bold FINDINGS: Upper lobe predominant hyperinflation. Crowding of lower lobe interstitial lung markings. Minimal spurring along the periphery of the left lung base. Apical pleuroparenchymal scarring and thickening. Heart is borderline enlarged with aortic atherosclerosis. No  aneurysm. No suspicious osseous lesions. No pneumothorax, effusion or pulmonary consolidation. IMPRESSION: No acute cardiopulmonary disease. Mild upper lobe predominant hyperinflation of the lungs with crowding of lower lobe interstitial lung markings. No alveolar consolidations to suggest pneumonia. No CHF Electronically Signed   By: Ashley Royalty M.D.   On: 04/02/2016 03:41     Medications:  Scheduled: . albuterol  2.5 mg Nebulization Q4H  . Chlorhexidine Gluconate Cloth  6 each Topical Q0600  . doxycycline  100 mg Oral Q12H  . DULoxetine  60 mg Oral BID  . enoxaparin (LOVENOX) injection  40 mg Subcutaneous Q24H  . estradiol  2 mg Oral Daily  . fenofibrate  54 mg Oral Daily  . folic acid  1 mg Oral BID  . gabapentin  300 mg Oral QHS  . insulin aspart  0-15 Units Subcutaneous TID WC  . insulin aspart  0-5 Units Subcutaneous QHS  . insulin glargine  15 Units Subcutaneous Daily  . ipratropium  0.5 mg Nebulization Q4H  . methylPREDNISolone (SOLU-MEDROL) injection  60 mg Intravenous Q6H  . mupirocin ointment  1 application Nasal BID  . pantoprazole  40 mg Oral Daily  . potassium chloride  30 mEq Oral Daily  . rOPINIRole  4 mg Oral BID  . [START ON 04/04/2016] rosuvastatin  10 mg Oral Q Sat-1800  . sodium chloride flush  3 mL Intravenous Q12H  . torsemide  60 mg Oral Daily   Continuous:  EHU:[id number] **OR** acetaminophen, aluminum hydroxide-magnesium carbonate, baclofen, benzonatate, guaiFENesin-codeine, LORazepam, ondansetron **OR** ondansetron (ZOFRAN) IV, ondansetron, traMADol, traZODone  Assessment/Plan:  Principal Problem:   Respiratory distress Active Problems:   Hypertension   Hyperlipidemia   Mild persistent chronic asthma without complication   Chronic diastolic heart failure (HCC)   Morbid obesity (HCC)   Acute bronchitis   Acute renal failure superimposed on stage 2 chronic kidney disease ([place])    Acute respiratory failure likely related to bronchitis in the  setting of small airway disease and influenza. Chest x-ray did not show any active cardiopulmonary process. Patient was recently diagnosed with influenza and has been treated for the same with Tamiflu. She was feeling better this morning. Continue with nebulizer treatments, steroids. We will add doxycycline to her regimen as well. Patient does have an increased oxygen demand. She's not on oxygen at home. Tachypnea oxygen saturation level 91%. Called by the nursing staff that the patient was feeling more short of breath. Chest x-ray will be repeated. ABG. May have to consider additional imaging studies.  Acute bronchitis in the setting of small airway disease/persistent chronic asthma, obstructive sleep apnea.  Patient is followed by pulmonology. Continue CPAP. Minimize sedating drugs.  Acute renal failure superimposed on stage II chronic kidney  disease.  Creatinine is stable. Continue home medications. No need for any IV fluids currently.   Chronic diastolic heart failure Appears compensated. Echo 3 years ago reveals an EF of 65% and grade 2 diastolic dysfunction. Daily weights. Monitor intake and output.  Diabetes mellitus type II on oral agents HbA1c 7.6. CBG is poorly controlled, likely related to steroids. Initiate Lantus. Continue sliding scale coverage.  Essential Hypertension Monitor blood pressures closely.  ADDENDUM Oxygen requirement continues to increase. Chest x-ray does not show any pulmonary edema or other acute findings. ABG reviewed. Plan was to transfer to stepdown for BiPAP. However, I feel that she may need even higher level of care. Discussed with Dr. Vaughan Browner with PCCM who will evaluate patient. In the meantime, there is no clear explanation for her hypoxia. Lungs are clear to auscultation. Venous thromboembolism is a possibility. Due to elevated creatinine. CT scan cannot be done. Patient could not tolerate VQ scan due to dyspnea. Venous Doppler study has been ordered.  Empirically start the patient on IV heparin.  DVT Prophylaxis: Lovenox    Code Status: Full code  Family Communication: Discussed with the patient  Disposition Plan: Follow up on chest x-ray and ABG. May need to go to stepdown unit for BiPAP.    LOS: 0 days   [facility] Pager [phone] 04/03/2016, 11:42 AM  If 7PM-7AM, please contact night-coverage at [email or web address], password [facility]

## 2016-04-03 NOTE — Progress Notes (Signed)
  Echocardiogram 2D Echocardiogram has been performed.  Tresa Res 04/03/2016, 4:45 PM

## 2016-04-03 NOTE — Progress Notes (Signed)
Swat nurse traveling to VQ Scan with patient

## 2016-04-03 NOTE — Progress Notes (Signed)
Pt transferred with rapid response nurse and bedside nurse, pt has all belongings  Kathleen Hatfield Kathleen Hatfield

## 2016-04-03 NOTE — Progress Notes (Signed)
Respiratory therapist at bedside, paged rapid response nurse for evaluation. Awaiting stepdown bed. Will send RN with patient to [place]

## 2016-04-03 NOTE — Progress Notes (Signed)
MD [name] called and stated he was transferring pt to step down for possible BiPAP. MD also ordered VQ scan. Will continue to monitor pt   Kathleen Hatfield

## 2016-04-03 NOTE — Progress Notes (Signed)
Pt slept good overnight, vitals stable, no any significant complain, CPAP was continue, BS this am 341, covered with 11 unit Novolog, cough medicine provided around the clock, Oxygen sat is maintained at 90% @3l /min, MRSA came positive, CHG bath and bactroban ointment started, will continue to monitor the patient.

## 2016-04-03 NOTE — Progress Notes (Signed)
Paged MD regarding new order for potassium, MD stated it was a duplicate order placed by pharmacy. MD stated do not give secondary dose.  [place] respiratory therapist, pt requesting breathing treatment, RT stated she was on her way

## 2016-04-03 NOTE — Progress Notes (Signed)
Pt placed on cpap as per home setting of 13.  Mask adjusted and pt tolerating well.

## 2016-04-03 NOTE — Progress Notes (Addendum)
ANTICOAGULATION CONSULT NOTE - Initial Consult  Pharmacy Consult for heparin Indication: R/O PE  Allergies  Allergen Reactions  . Almond Oil Anaphylaxis  . Ceclor [Cefaclor] Nausea And Vomiting  . Ciprofloxacin Other (See Comments)    Makes joints and muscles ache  . Levaquin [Levofloxacin] Other (See Comments)    Body aches  . Morphine And Related     Pt. States while in the hospital it affected her breathing, O2 dropped to the 70's  . Sulfa Antibiotics Nausea And Vomiting    Patient Measurements: Height: 5' 5"  (165.1 cm) Weight: 265 lb 12.8 oz (120.6 kg) (scale c) IBW/kg (Calculated) : 57 Heparin Dosing Weight: 86  Vital Signs: Temp: 98.9 F (37.2 C) (01/26 1210) Temp Source: Oral (01/26 0827) BP: 150/67 (01/26 1425) Pulse Rate: 80 (01/26 1425)  Labs:  Recent Labs  03/31/16 2015 04/02/16 0141 04/03/16 0451  HGB 12.0 11.5* 11.1*  HCT 37.7 35.6* 35.1*  PLT 406* 414* 412*  CREATININE 1.37* 1.41* 1.40*    Estimated Creatinine Clearance: 54.9 mL/min (by C-G formula based on SCr of 1.4 mg/dL (H)).   Medical History: Past Medical History:  Diagnosis Date  . Anemia   . Arthritis   . Asthma   . CHF (congestive heart failure) ([place]) 05/2014  . Chronic diastolic heart failure ([place])   . CKD (chronic kidney disease)   . Depression   . Diabetes mellitus without complication ([place])   . Diverticulitis   . Dyspnea    with exertion  . GERD (gastroesophageal reflux disease)   . History of blood transfusion   . Hyperlipidemia    cannot tolerate statins  . Hypertension   . Peripheral neuropathy ([place])   . Pneumonia   . PONV (postoperative nausea and vomiting)   . Sleep apnea    uses CPAP  . Ulcerative colitis ([place])    dr Collene Mares    Medications:  Scheduled:  . albuterol  2.5 mg Nebulization Q4H  . budesonide (PULMICORT) nebulizer solution  0.25 mg Nebulization BID  . Chlorhexidine Gluconate Cloth  6 each Topical Q0600  . doxycycline  100 mg Oral Q12H  .  DULoxetine  60 mg Oral BID  . estradiol  2 mg Oral Daily  . fenofibrate  54 mg Oral Daily  . folic acid  1 mg Oral BID  . gabapentin  300 mg Oral QHS  . insulin aspart  0-15 Units Subcutaneous TID WC  . insulin aspart  0-5 Units Subcutaneous QHS  . insulin glargine  15 Units Subcutaneous Daily  . ipratropium  0.5 mg Nebulization Q4H  . methylPREDNISolone (SOLU-MEDROL) injection  60 mg Intravenous Q6H  . mupirocin ointment  1 application Nasal BID  . pantoprazole  40 mg Oral Daily  . potassium chloride  30 mEq Oral Daily  . rOPINIRole  4 mg Oral BID  . [START ON 04/04/2016] rosuvastatin  10 mg Oral Q Sat-1800  . sodium chloride flush  3 mL Intravenous Q12H  . torsemide  60 mg Oral Daily    Assessment: 61yo female admitted 1/25 with COPD exacerbation and influenza who developed worsening dyspnea today.  Pt was unable to tolerate VQ scanner and is to start heparin for presumed PE.  No baseline coag studies have been completed.  Hg 11.1 and pltc is high.  Pt with history of bleeding due to diverticulitis ~46yrago, but no issues since per patient.  Lovenox 440mSQ q24 had been ordered for pt, but dose was refused last PM.  It is now discontinued.  Goal of Therapy:  Heparin level 0.3-0.7 units/ml Monitor platelets by anticoagulation protocol: Yes   Plan:  Heparin 4500 units IV x 1, 1350 units/hr Heparin level 6hr Daily heparin level, CBC Watch for s/s of bleeding   Gracy Bruins, [facility]

## 2016-04-03 NOTE — Progress Notes (Signed)
Nutrition Brief Note  Nutrition Consult received for assessment of nutrition status/recommendations.   61 year old female with history of diabetes, COPD, CHF, and HTN presents with ongoing influenza and AoCKD.  Wt Readings from Last 15 Encounters:  04/03/16 265 lb 12.8 oz (120.6 kg)  12/11/15 269 lb 9.6 oz (122.3 kg)  11/15/15 265 lb (120.2 kg)  11/05/15 265 lb (120.2 kg)  10/21/15 272 lb 8 oz (123.6 kg)  09/16/15 269 lb (122 kg)  08/26/15 263 lb 6.4 oz (119.5 kg)  07/02/15 274 lb 9.6 oz (124.6 kg)  06/07/15 275 lb (124.7 kg)  04/15/15 275 lb (124.7 kg)  10/09/14 271 lb (122.9 kg)  05/11/14 277 lb 6.4 oz (125.8 kg)  02/20/14 269 lb (122 kg)  12/19/13 277 lb 4 oz (125.8 kg)  12/14/13 272 lb (123.4 kg)    Body mass index is 44.23 kg/m. Patient meets criteria for Morbid Obesity based on current BMI. Pt reports having a normal appetite and eating well. She denies any swelling at this time. She reports following a low sodium and carb balance diet PTA. She eats protein and most meals and vegetables most days.RD reviewed Heart Healthy/Carb Modified diet restrictions and discussed meal ordering.  Pt not very engaged during assessment. She denies any nutrition questions or concerns at this time.   Current diet order is Heart Healthy/Carb Modified, patient is consuming approximately 100% of meals at this time. Labs and medications reviewed.   No nutrition interventions warranted at this time. If nutrition issues arise, please consult RD.   Scarlette Ar RD, CSP, LDN Inpatient Clinical Dietitian Pager: [phone] After Hours Pager: [phone]

## 2016-04-03 NOTE — Progress Notes (Signed)
Pt took off CPAP mask, stated she ate breakfast without supplemental oxygen on. Oxygen level dropped to 81% on room air. Placed nasal cannula on pt, instructed to take deep breaths in through the nose and out through the mouth. Pt oxygen level went up to 94% on 3 litters. Pt previously on nasal cannula 2 liters. Pt does not wear oxygen at home. Pt resting on side of bed. Will continue to monitor  Librada Castronovo Leory Plowman

## 2016-04-03 NOTE — Progress Notes (Signed)
Paged MD regarding pt having difficulty breathing still, RT at bedside completing breathing treatment. MD placing orders for blood gas and chest xray. At bedside will continue to monitor  Kathleen Hatfield

## 2016-04-03 NOTE — Progress Notes (Signed)
Paged MD regarding pt unable to tolerate VQ Scan, awaiting call back  Rapid response nurse at bedside  MD called back and stated he would call pulmonology to see the patient  Kathleen Hatfield

## 2016-04-03 NOTE — Consult Note (Signed)
Name: Kathleen Hatfield MRN: [id number] DOB: 1956-02-12    ADMISSION DATE:  04/02/2016 CONSULTATION DATE:  1/26  REFERRING MD :  [name]   CHIEF COMPLAINT:  Acute hypoxic respiratory failure   BRIEF PATIENT DESCRIPTION:  Obese female f/b AD in our clinic for chronic fixed asthma, obesity and sleep apnea. Also has h/o diastolic dysfxn, CKD and DM. Admitted w/ influenza A, purulent bronchitis and asthmatic exacerbation on 1/25. PCCM asked to see for progressive hypoxia seemingly out of proportion to her CXR changes.   SIGNIFICANT EVENTS  1/25 admitted 1/26 transferred to SDU for worsening hypoxia  STUDIES:  ECHO 1/26>>> LE [place] 1/26>>> VQ 1/26>>> PCT 1/26>>>  Cultures  ABX Rocephin 1/26>>> Doxy 1/26>>>  HISTORY OF PRESENT ILLNESS:   This is a 61 year old female w/ sig h/o diastolic HF grade II, CKD stage II, and DM. Was in [place] until 1/21 when developed sore throat, watery eyes and some increased shortness of breath requiring rescue SABA. This continued to worsen and she eventually went to urgent care on 1/22 at that point she tested + for Influenza A and was started on Tamiflu. Her breathing continued to worsen so she went to the ER on 1/23 improved some w/ nebs and steroids so was sent home w/ pred taper, instructions to cont BDs and tamiflu. She returned to the ER 1/25 w/ worsening shortness of breath and associated anxiety. SHe was admitted for further eval. W/ working dx of asthmatic exacerbation, bronchitis and influenza. PCCM asked to see on 1/26 as Oxygen requirements were increasing.   PAST MEDICAL HISTORY :   has a past medical history of Anemia; Arthritis; Asthma; CHF (congestive heart failure) ([place]) (05/2014); Chronic diastolic heart failure ([place]); CKD (chronic kidney disease); Depression; Diabetes mellitus without complication ([place]); Diverticulitis; Dyspnea; GERD (gastroesophageal reflux disease); History of blood transfusion; Hyperlipidemia; Hypertension; Peripheral neuropathy  ([place]); Pneumonia; PONV (postoperative nausea and vomiting); Sleep apnea; and Ulcerative colitis ([place]).  has a past surgical history that includes Abdominal hysterectomy; Cholecystectomy; Cesarean section; Appendectomy; Sigmoidectomy (2010); right heart catheterization (N/A, 02/27/2013); right heart catheterization (N/A, 12/14/2013); Cardiac catheterization; Breast surgery; Hernia repair; Ventral hernia repair (N/A, 11/15/2015); Laparoscopic lysis of adhesions (N/A, 11/15/2015); and Insertion of mesh (N/A, 11/15/2015). Prior to Admission medications   Medication Sig Start Date End Date Taking? Authorizing Provider  acetaminophen (TYLENOL) 500 MG tablet Take 500 mg by mouth every 4 (four) hours as needed (pain).    Yes Historical Provider, MD  ADVAIR HFA 115-21 MCG/ACT inhaler INHALE 2 PUFFS INTO THE LUNGS 2 TIMES DAILY. 03/13/16  Yes Tanda Rockers, MD  albuterol (PROVENTIL) (2.5 MG/3ML) 0.083% nebulizer solution Take 3 mLs (2.5 mg total) by nebulization every 4 (four) hours as needed for wheezing. 08/26/15  Yes Tanda Rockers, MD  Alum Hydroxide-Mag Carbonate (GAVISCON PO) Take 2 tablets by mouth at bedtime as needed (reflux).    Yes Historical Provider, MD  AMITIZA 24 MCG capsule Take 1 capsule by mouth 2 (two) times daily as needed for constipation.  10/22/15  Yes Historical Provider, MD  baclofen (LIORESAL) 5 mg TABS tablet Take 2.5 mg by mouth 2 (two) times daily as needed for muscle spasms.  03/27/13  Yes Dixon Boos, MD  Cholecalciferol (VITAMIN D-3) 5000 UNITS TABS Take 5,000 Units by mouth daily.    Yes Historical Provider, MD  DULoxetine (CYMBALTA) 60 MG capsule Take 60 mg by mouth 2 (two) times daily.    Yes Historical Provider, MD  estradiol (ESTRACE) 2  MG tablet Take 1 tablet (2 mg total) by mouth daily. 07/29/11  Yes Delsa Bern, MD  fenofibrate (TRICOR) 145 MG tablet Take 145 mg by mouth at bedtime.  09/28/14  Yes Historical Provider, MD  folic acid (FOLVITE) 1 MG tablet Take 1 mg by mouth 2  (two) times daily.   Yes Historical Provider, MD  gabapentin (NEURONTIN) 100 MG capsule Take 300 mg by mouth at bedtime.    Yes Historical Provider, MD  guaiFENesin-codeine 100-10 MG/5ML syrup Take 10 mLs by mouth every 6 (six) hours as needed for cough. 04/01/16  Yes Kristen N Ward, DO  Liraglutide (VICTOZA) 18 MG/3ML SOPN Inject 1.2 mg into the skin every evening.    Yes Historical Provider, MD  LORazepam (ATIVAN) 1 MG tablet Take 1 tablet by mouth at bedtime.    Yes Historical Provider, MD  metFORMIN (GLUCOPHAGE-XR) 750 MG 24 hr tablet Take 750 mg by mouth daily.    Yes Historical Provider, MD  ondansetron (ZOFRAN ODT) 4 MG disintegrating tablet Take 1 tablet (4 mg total) by mouth every 8 (eight) hours as needed for nausea or vomiting. 04/01/16  Yes Kristen N Ward, DO  oseltamivir (TAMIFLU) 75 MG capsule Take 75 mg by mouth 2 (two) times daily. 03/29/16  Yes Historical Provider, MD  pantoprazole (PROTONIX) 40 MG tablet Take 40 mg by mouth daily.  04/14/12  Yes Historical Provider, MD  potassium chloride (K-DUR,KLOR-CON) 10 MEQ tablet Take 30 mEq by mouth daily.  03/25/15  Yes Historical Provider, MD  predniSONE (DELTASONE) 20 MG tablet Take 3 tablets (60 mg total) by mouth daily. 04/01/16  Yes Kristen N Ward, DO  PROAIR HFA 108 (90 Base) MCG/ACT inhaler INHALE 2 PUFFS INTO THE LUNGS EVERY FOUR HOURS AS NEEDED FOR WHEEZING. 11/21/15  Yes Tanda Rockers, MD  pseudoephedrine (SUDAFED) 120 MG 12 hr tablet Take 120 mg by mouth daily as needed for congestion.   Yes Historical Provider, MD  rOPINIRole (REQUIP) 4 MG tablet Take 4 mg by mouth 2 (two) times daily.    Yes Historical Provider, MD  rosuvastatin (CRESTOR) 10 MG tablet Take 10 mg by mouth once a week. On Saturday or Sunday 03/25/15  Yes Historical Provider, MD  torsemide (DEMADEX) 20 MG tablet TAKE 3 TABLETS DAILY Patient taking differently: Take 60 mg by mouth in the morning 12/24/14  Yes [name], MD  traMADol (ULTRAM) 50 MG tablet Take 1.5-2  tablets (75-100 mg total) by mouth every 6 (six) hours as needed for severe pain. 11/15/15  Yes [name], MD  traZODone (DESYREL) 50 MG tablet Take 50 mg by mouth at bedtime as needed for sleep.  06/19/15  Yes Historical Provider, MD  TRESIBA FLEXTOUCH 100 UNIT/ML SOPN Inject 15 Units into the skin at bedtime. Max of 60 units/day 06/19/15  Yes Historical Provider, MD  BAYER CONTOUR NEXT TEST test strip As directed 07/30/15   Historical Provider, MD  GLOBAL EASE INJECT PEN NEEDLES 32G X 4 MM [place]  06/19/15   Historical Provider, MD  [place] As directed 07/30/15   Historical Provider, MD  [place] into the lungs at bedtime. CPAP    Historical Provider, MD   Allergies  Allergen Reactions  . Almond Oil Anaphylaxis  . Ceclor [Cefaclor] Nausea And Vomiting  . Ciprofloxacin Other (See Comments)    Makes joints and muscles ache  . Levaquin [Levofloxacin] Other (See Comments)    Body aches  . Morphine And Related     Pt.  States while in the hospital it affected her breathing, O2 dropped to the 70's  . Sulfa Antibiotics Nausea And Vomiting    FAMILY HISTORY:  family history includes COPD in her father; Dementia in her mother; Heart disease in her father and mother; Hypertension in her father and mother. SOCIAL HISTORY:  reports that she has never smoked. She has never used smokeless tobacco. She reports that she does not drink alcohol or use drugs.  REVIEW OF SYSTEMS:   Constitutional:  + fever, chills, weight loss, malaise/fatigue and diaphoresis.  HENT: Negative for hearing loss, ear pain, nosebleeds, congestion, sore throat, neck pain, tinnitus and ear discharge.   Eyes: Negative for blurred vision, double vision, photophobia, pain, discharge and redness.  Respiratory: + cough, no hemoptysis, sputum production, shortness of breath, wheezing but no stridor.   Cardiovascular: + chest pain w/ cough, palpitations, orthopnea, claudication, leg swelling and PND.    Gastrointestinal: + heartburn, nausea, vomiting, abdominal pain, diarrhea, constipation, blood in stool and melena.  Genitourinary: Negative for dysuria, urgency, frequency, hematuria and flank pain.  Musculoskeletal: Negative for myalgias, back pain, joint pain and falls.  Skin: Negative for itching and rash.  Neurological: Negative for dizziness, tingling, tremors, sensory change, speech change, focal weakness, seizures, loss of consciousness, weakness and headaches.  Endo/Heme/Allergies: Negative for environmental allergies and polydipsia. Does not bruise/bleed easily.  SUBJECTIVE:  Short of breath VITAL SIGNS: Temp:  [98.1 F (36.7 C)-99.5 F (37.5 C)] 99.1 F (37.3 C) (01/26 1505) Pulse Rate:  [64-87] 72 (01/26 1505) Resp:  [18-22] 22 (01/26 1505) BP: (105-154)/(47-76) 154/76 (01/26 1505) SpO2:  [81 %-100 %] 97 % (01/26 1505) FiO2 (%):  [28 %] 28 % (01/25 1614) Weight:  [265 lb 12.8 oz (120.6 kg)-268 lb 14.4 oz (122 kg)] 265 lb 12.8 oz (120.6 kg) (01/26 0542)  PHYSICAL EXAMINATION: General appearance:  61Year old  Female, obese & in mild distress, awake, anxious but conversant  Eyes: anicteric, moist conjunctivae; PERRL, EOMI bilaterally. Mouth:  membranes and no mucosal ulcerations; normal hard and soft palate Neck: Trachea midline; neck supple, no JVD Lungs/chest: diffuse rhonchi w/ wheeze. Mild accessory use CV: RRR, no MRGs  Abdomen: Soft, non-tender; no masses or HSM Extremities: No peripheral edema or extremity lymphadenopathy Skin: Normal temperature, turgor and texture; no rash, ulcers or subcutaneous nodules Psych: Appropriate affect, alert and oriented to person, place and time CBC Recent Labs     03/31/16  2015  04/02/16  0141  04/03/16  0451  WBC  8.1  13.7*  11.2*  HGB  12.0  11.5*  11.1*  HCT  37.7  35.6*  35.1*  PLT  406*  414*  412*    Coag's No results for input(s): APTT, INR in the last 72 hours.  BMET Recent Labs     03/31/16  2015   04/02/16  0141  04/03/16  0451  NA  139  135  136  K  3.3*  3.9  4.1  CL  100*  95*  94*  CO2  28  22  27   BUN  13  20  28*  CREATININE  1.37*  1.41*  1.40*  GLUCOSE  169*  352*  306*    Electrolytes Recent Labs     03/31/16  2015  04/02/16  0141  04/03/16  0451  CALCIUM  9.3  9.3  9.2    Sepsis Markers No results for input(s): PROCALCITON, O2SATVEN in the last 72 hours.  Invalid input(s): LACTICACIDVEN  ABG Recent Labs     04/03/16  1215  PHART  7.456*  PCO2ART  45.2  PO2ART  60.9*    Liver Enzymes Recent Labs     03/31/16  2015  AST  42*  ALT  50  ALKPHOS  51  BILITOT  0.2*  ALBUMIN  3.7    Cardiac Enzymes No results for input(s): TROPONINI, PROBNP in the last 72 hours.  Glucose Recent Labs     04/02/16  0823  04/02/16  1205  04/02/16  1531  04/02/16  2058  04/03/16  0550  04/03/16  1112  GLUCAP  233*  330*  310*  363*  341*  348*    Imaging Dg Chest 2 View  Result Date: 04/02/2016 CLINICAL DATA:  Cough and dyspnea.  Fever x1 week. EXAM: CHEST  2 VIEW COMPARISON:  03/31/2016 bold FINDINGS: Upper lobe predominant hyperinflation. Crowding of lower lobe interstitial lung markings. Minimal spurring along the periphery of the left lung base. Apical pleuroparenchymal scarring and thickening. Heart is borderline enlarged with aortic atherosclerosis. No aneurysm. No suspicious osseous lesions. No pneumothorax, effusion or pulmonary consolidation. IMPRESSION: No acute cardiopulmonary disease. Mild upper lobe predominant hyperinflation of the lungs with crowding of lower lobe interstitial lung markings. No alveolar consolidations to suggest pneumonia. No CHF Electronically Signed   By: Ashley Royalty M.D.   On: 04/02/2016 03:41   Dg Chest Port 1 View  Result Date: 04/03/2016 CLINICAL DATA:  Hypoxia today. EXAM: PORTABLE CHEST 1 VIEW COMPARISON:  PA and lateral chest 03/31/2016 and 04/02/2016. CT chest 02/10/2013. FINDINGS: There is mild cardiomegaly without  edema. No consolidative process, pneumothorax or effusion. No acute bony abnormality. Aortic atherosclerosis noted. IMPRESSION: Cardiomegaly without acute disease. Electronically Signed   By: Inge Rise M.D.   On: 04/03/2016 13:01   Possible evolving interstitial changes.    ASSESSMENT / PLAN:  Acute hypoxic respiratory failure  Asthmatic exacerbation in setting of chronic fixed asthma  Influenza A Purulent bronchitis Obesity OSA GERD  Known grade II diastolic dysfxn CKI  Acute hypoxic respiratory failure in setting of Influenza c/b purulent bronchitis and asthmatic exacerbation superimposed on underlying OSA. Can't exclude thromboembolic, but suspect evolving pneumonitis and bronchospasm explain her hypoxia. Her plain film is difficult to interpret but it does appear as though she has some early interstitial changes. Ideally a CT of chest would be helpful here but she can't tolerate lying flat and from a stand-point of thromboembolic work-up she has CKI so can't get Dye load.   Plan Agree w/ SDU ECHO eval LV fxn AND RV Get LE [place] r/o DVT Cycle CEs Ck BNP, and pct BIPAP (w/ PRN rests) Cont BDs Cont systemic steroids Add rocephin and cont doxy Agree w/ empiric heparin until we can r/o PE Lasix IV X 1 and cont Demadex  Increase PPI to decrease reflux contribution to bronchospasm   All other issues  DM, anemia (dilutional), HTN & HL per IM service.    Erick Colace ACNP-BC [place] Pager # [phone] OR # [phone] if no answer     04/03/2016, 3:19 PM

## 2016-04-04 ENCOUNTER — Inpatient Hospital Stay (HOSPITAL_COMMUNITY): Payer: Medicare HMO

## 2016-04-04 DIAGNOSIS — R0602 Shortness of breath: Secondary | ICD-10-CM

## 2016-04-04 DIAGNOSIS — M7989 Other specified soft tissue disorders: Secondary | ICD-10-CM

## 2016-04-04 DIAGNOSIS — J9601 Acute respiratory failure with hypoxia: Secondary | ICD-10-CM

## 2016-04-04 DIAGNOSIS — I5032 Chronic diastolic (congestive) heart failure: Secondary | ICD-10-CM

## 2016-04-04 DIAGNOSIS — J4530 Mild persistent asthma, uncomplicated: Secondary | ICD-10-CM

## 2016-04-04 LAB — BASIC METABOLIC PANEL
Anion gap: 13 (ref 5–15)
BUN: 31 mg/dL — ABNORMAL HIGH (ref 6–20)
CO2: 33 mmol/L — ABNORMAL HIGH (ref 22–32)
Calcium: 8.6 mg/dL — ABNORMAL LOW (ref 8.9–10.3)
Chloride: 91 mmol/L — ABNORMAL LOW (ref 101–111)
Creatinine, Ser: 1.3 mg/dL — ABNORMAL HIGH (ref 0.44–1.00)
GFR calc Af Amer: 50 mL/min — ABNORMAL LOW (ref >60)
GFR calc non Af Amer: 43 mL/min — ABNORMAL LOW (ref >60)
Glucose, Bld: 300 mg/dL — ABNORMAL HIGH (ref 65–99)
Potassium: 3.8 mmol/L (ref 3.5–5.1)
Sodium: 137 mmol/L (ref 135–145)

## 2016-04-04 LAB — CBC
HCT: 34.2 % — ABNORMAL LOW (ref 36.0–46.0)
Hemoglobin: 10.8 g/dL — ABNORMAL LOW (ref 12.0–15.0)
MCH: 24.3 pg — ABNORMAL LOW (ref 26.0–34.0)
MCHC: 31.6 g/dL (ref 30.0–36.0)
MCV: 77 fL — ABNORMAL LOW (ref 78.0–100.0)
Platelets: 415 10*3/uL — ABNORMAL HIGH (ref 150–400)
RBC: 4.44 MIL/uL (ref 3.87–5.11)
RDW: 17.5 % — ABNORMAL HIGH (ref 11.5–15.5)
WBC: 12.5 10*3/uL — ABNORMAL HIGH (ref 4.0–10.5)

## 2016-04-04 LAB — PROCALCITONIN: Procalcitonin: 0.14 ng/mL

## 2016-04-04 LAB — ECHOCARDIOGRAM COMPLETE
Height: 65 in
Weight: 4252.8 oz

## 2016-04-04 LAB — HEPARIN LEVEL (UNFRACTIONATED)
Heparin Unfractionated: 0.15 IU/mL — ABNORMAL LOW (ref 0.30–0.70)
Heparin Unfractionated: 0.52 IU/mL (ref 0.30–0.70)

## 2016-04-04 LAB — GLUCOSE, CAPILLARY
Glucose-Capillary: 292 mg/dL — ABNORMAL HIGH (ref 65–99)
Glucose-Capillary: 363 mg/dL — ABNORMAL HIGH (ref 65–99)
Glucose-Capillary: 304 mg/dL — ABNORMAL HIGH (ref 65–99)
Glucose-Capillary: 287 mg/dL — ABNORMAL HIGH (ref 65–99)

## 2016-04-04 LAB — TROPONIN I: Troponin I: <0.03 ng/mL (ref <0.03)

## 2016-04-04 MED ORDER — PANTOPRAZOLE SODIUM 40 MG PO TBEC
40.0000 mg | DELAYED_RELEASE_TABLET | Freq: Two times a day (BID) | ORAL | Status: DC
  Administered 2016-04-04 – 2016-04-09 (×10): 40 mg via ORAL
  Filled 2016-04-04 (×10): qty 1

## 2016-04-04 MED ORDER — METHYLPREDNISOLONE SODIUM SUCC 125 MG IJ SOLR
80.0000 mg | Freq: Two times a day (BID) | INTRAMUSCULAR | Status: DC
Start: 2016-04-04 — End: 2016-04-07
  Administered 2016-04-04 – 2016-04-06 (×5): 80 mg via INTRAVENOUS
  Filled 2016-04-04 (×5): qty 2

## 2016-04-04 MED ORDER — HEPARIN BOLUS VIA INFUSION
2500.0000 [IU] | Freq: Once | INTRAVENOUS | Status: AC
  Administered 2016-04-04: 2500 [IU] via INTRAVENOUS
  Filled 2016-04-04: qty 2500

## 2016-04-04 MED ORDER — DEXTROSE 5 % IV SOLN
1.0000 g | INTRAVENOUS | Status: DC
  Administered 2016-04-04 – 2016-04-08 (×5): 1 g via INTRAVENOUS
  Filled 2016-04-04 (×6): qty 10

## 2016-04-04 MED ORDER — INSULIN GLARGINE 100 UNIT/ML ~~LOC~~ SOLN
10.0000 [IU] | Freq: Once | SUBCUTANEOUS | Status: AC
Start: 2016-04-04 — End: 2016-04-04
  Administered 2016-04-04: 10 [IU] via SUBCUTANEOUS
  Filled 2016-04-04: qty 0.1

## 2016-04-04 MED ORDER — OSELTAMIVIR PHOSPHATE 30 MG PO CAPS
30.0000 mg | ORAL_CAPSULE | Freq: Two times a day (BID) | ORAL | Status: AC
  Administered 2016-04-04 – 2016-04-08 (×10): 30 mg via ORAL
  Filled 2016-04-04 (×11): qty 1

## 2016-04-04 MED ORDER — INSULIN GLARGINE 100 UNIT/ML ~~LOC~~ SOLN
25.0000 [IU] | Freq: Every day | SUBCUTANEOUS | Status: DC
  Administered 2016-04-05 – 2016-04-06 (×2): 25 [IU] via SUBCUTANEOUS
  Filled 2016-04-04 (×2): qty 0.25

## 2016-04-04 NOTE — Evaluation (Signed)
Physical Therapy Evaluation Patient Details Name: Kathleen Hatfield MRN: [id number] DOB: [birth year] Today's Date: 04/04/2016   History of Present Illness  Pt is a 61 y/o female admitted from home secondary to dyspnea. PMH including but not limited to obesity with small airway disease, HTN, DM, CHF, CKD, and ulcerative colitis.   Clinical Impression  Pt presented supine in bed with HOB very elevated, awake and willing to participate in therapy session. Prior to admission, pt reported that she was independent with all functional mobility and ADLs. Pt currently requires min guard for bed mobility and transfers. Pt only agreeable to participate in sitting EOB and standing secondary to increased dyspnea and overall "feeling not well". Pt's SPO2 decreased into the mid 80's multiple times during standing and sitting but quickly increased to >90% with rest breaks and focusing on deep breathing. Pt would continue to benefit from skilled physical therapy services at this time while admitted to address her below listed limitations in order to improve her overall safety and independence with functional mobility.       Follow Up Recommendations No PT follow up    Equipment Recommendations  None recommended by PT    Recommendations for Other Services       Precautions / Restrictions Restrictions Weight Bearing Restrictions: No      Mobility  Bed Mobility Overal bed mobility: Needs Assistance Bed Mobility: Supine to Sit;Sit to Supine     Supine to sit: Min guard;HOB elevated Sit to supine: Min guard   General bed mobility comments: increased time, min guard for safety  Transfers Overall transfer level: Needs assistance Equipment used: None Transfers: Sit to/from Stand Sit to Stand: Min guard         General transfer comment: increased time, min guard for safety  Ambulation/Gait             General Gait Details: pt only agreeable to standing and pre-gait activities (lateral weight  shifting) secondary to increased WOB, decrease in SPO2 and overall "not feeling well"  Stairs            Wheelchair Mobility    Modified Rankin (Stroke Patients Only)       Balance Overall balance assessment: Needs assistance Sitting-balance support: Feet supported;No upper extremity supported Sitting balance-Leahy Scale: Good     Standing balance support: During functional activity;No upper extremity supported Standing balance-Leahy Scale: Fair                               Pertinent Vitals/Pain Pain Assessment: No/denies pain    Home Living Family/patient expects to be discharged to:: Private residence Living Arrangements: Children Available Help at Discharge: Family;Friend(s);Available PRN/intermittently Type of Home: House Home Access: Stairs to enter Entrance Stairs-Rails: None Entrance Stairs-Number of Steps: 1 Home Layout: One level Home Equipment: None      Prior Function Level of Independence: Independent         Comments: pt reported that she could only ambulate short distances secondary to respiratory issues and difficulty breathing     Hand Dominance        Extremity/Trunk Assessment   Upper Extremity Assessment Upper Extremity Assessment: Overall WFL for tasks assessed    Lower Extremity Assessment Lower Extremity Assessment: Overall WFL for tasks assessed    Cervical / Trunk Assessment Cervical / Trunk Assessment: Normal  Communication   Communication: No difficulties  Cognition Arousal/Alertness: Awake/alert Behavior During Therapy: WFL for tasks assessed/performed  Overall Cognitive Status: Within Functional Limits for tasks assessed                      General Comments      Exercises     Assessment/Plan    PT Assessment Patient needs continued PT services  PT Problem List Decreased activity tolerance;Decreased balance;Decreased mobility;Decreased coordination;Cardiopulmonary status limiting  activity          PT Treatment Interventions DME instruction;Gait training;Stair training;Functional mobility training;Therapeutic activities;Therapeutic exercise;Balance training;Neuromuscular re-education;Patient/family education    PT Goals (Current goals can be found in the Care Plan section)  Acute Rehab PT Goals Patient Stated Goal: feel better PT Goal Formulation: With patient Time For Goal Achievement: 04/18/16 Potential to Achieve Goals: Good    Frequency Min 3X/week   Barriers to discharge        Co-evaluation               End of Session Equipment Utilized During Treatment: Gait belt;Oxygen (venturi mask) Activity Tolerance: Patient tolerated treatment well Patient left: in bed;with call bell/phone within reach Nurse Communication: Mobility status         Time: 3383-2919 PT Time Calculation (min) (ACUTE ONLY): 23 min   Charges:   PT Evaluation $PT Eval Moderate Complexity: 1 Procedure PT Treatments $Therapeutic Activity: 8-22 mins   PT G CodesClearnce Sorrel Chassidy Layson 04/04/2016, 12:49 PM Sherie Don, [place], DPT [phone]

## 2016-04-04 NOTE — Progress Notes (Signed)
ANTICOAGULATION CONSULT NOTE - Follow-up Consult  Pharmacy Consult for heparin Indication: R/O PE  Allergies  Allergen Reactions  . Almond Oil Anaphylaxis  . Ceclor [Cefaclor] Nausea And Vomiting  . Ciprofloxacin Other (See Comments)    Makes joints and muscles ache  . Levaquin [Levofloxacin] Other (See Comments)    Body aches  . Morphine And Related     Pt. States while in the hospital it affected her breathing, O2 dropped to the 70's  . Sulfa Antibiotics Nausea And Vomiting    Patient Measurements: Height: 5' 5"  (165.1 cm) Weight: 265 lb 12.8 oz (120.6 kg) (scale c) IBW/kg (Calculated) : 57 Heparin Dosing Weight: 86  Vital Signs: Temp: 98 F (36.7 C) (01/26 2300) Temp Source: Axillary (01/26 2300) BP: 140/89 (01/26 2300) Pulse Rate: 65 (01/26 2300)  Labs:  Recent Labs  04/02/16 0141 04/03/16 0451 04/03/16 1601 04/03/16 2128 04/03/16 2337  HGB 11.5* 11.1*  --   --   --   HCT 35.6* 35.1*  --   --   --   PLT 414* 412*  --   --   --   LABPROT  --   --  13.1  --   --   INR  --   --  0.99  --   --   HEPARINUNFRC  --   --   --   --  0.15*  CREATININE 1.41* 1.40*  --   --   --   TROPONINI  --   --  0.03* <0.03  --     Estimated Creatinine Clearance: 54.9 mL/min (by C-G formula based on SCr of 1.4 mg/dL (H)).   Assessment: 61yo female admitted 1/25 with COPD exacerbation and influenza who developed worsening dyspnea today.  Pt was unable to tolerate VQ scanner and on heparin for presumed PE.  Heparin level subtherapeutic (0.15) on gtt at 1350 units/hr. No issues with line or bleeding reported per RN.  Goal of Therapy:  Heparin level 0.3-0.7 units/ml Monitor platelets by anticoagulation protocol: Yes   Plan:  Rebolus heparin 2500 units Increase heparin gtt to 1600 units/hr Will f/u heparin level in 6 hours  Sherlon Handing, PharmD, BCPS Clinical pharmacist, pager [phone] 04/04/2016 1:27 AM

## 2016-04-04 NOTE — Progress Notes (Signed)
VASCULAR LAB PRELIMINARY  PRELIMINARY  PRELIMINARY  PRELIMINARY  Bilateral lower extremity venous duplex completed.    Preliminary report:  There is no DVT or SVT noted in the bilateral lower extremities.   Khyri Hinzman, RVT 04/04/2016, 10:37 AM

## 2016-04-04 NOTE — Progress Notes (Signed)
[facility] PROGRESS NOTE  Kathleen Hatfield HCW:[id number] DOB: [birth year] DOA: 04/02/2016  PCP: Ann Held, MD  Brief History/Interval Summary: 60 year old Caucasian female with a past medical history of obesity, hypertension, small airway disease/asthma, obstructive sleep apnea, presented with complaints of shortness of breath and cough. Patient was recently diagnosed with the influenza and was started on Tamiflu. She was thought to have acute bronchitis. On 1/26. Patient started feeling poorly. She had more shortness of breath and was noted to be more hypoxic. She was transferred to stepdown unit. Placed on BiPAP. Critical care medicine was consulted.  Reason for Visit: Acute bronchitis  Consultants: Pulmonology  Procedures:  Lower extremity venous Doppler is negative for DVT  Transthoracic echocardiogram Study Conclusions  - Left ventricle: The cavity size was normal. Wall thickness was   increased in a pattern of mild LVH. Systolic function was   vigorous. The estimated ejection fraction was in the range of 65%   to 70%. Wall motion was normal; there were no regional wall   motion abnormalities. Doppler parameters are consistent with   abnormal left ventricular relaxation (grade 1 diastolic   dysfunction).  Impressions:  - Vigorous LV systolic function; mild LVH; grade 1 diastolic   dysfunction; elevated LVOT gradient (2.6 m/s) likely related to   hyperdynamic LV function; aortic valve visually appears to open   well.  Antibiotics: Currently on Tamiflu, doxycycline and ceftriaxone  Subjective/Interval History: Patient states that she is feeling better today compared to yesterday. Continues to have some chest discomfort especially with coughing and deep breathing. Continues to have a cough which is dry.   ROS: Denies any nausea or vomiting  Objective:  Vital Signs  Vitals:   04/04/16 0351 04/04/16 0354 04/04/16 0751 04/04/16 0805  BP:  126/65 137/73     Pulse:  65 62   Resp:  (!) 21 14   Temp:  98 F (36.7 C) 98.8 F (37.1 C)   TempSrc:  Axillary Oral   SpO2:  92% 97% 96%  Weight: 115.7 kg (255 lb 1.6 oz)     Height:        Intake/Output Summary (Last 24 hours) at 04/04/16 0838 Last data filed at 04/04/16 0800  Gross per 24 hour  Intake           793.45 ml  Output             4100 ml  Net         -3306.55 ml   Filed Weights   04/02/16 1527 04/03/16 0542 04/04/16 0351  Weight: 122 kg (268 lb 14.4 oz) 120.6 kg (265 lb 12.8 oz) 115.7 kg (255 lb 1.6 oz)    General appearance: alert, cooperative, appears stated age and no distress Resp: Coarse breath sounds bilaterally. Scattered wheezing is noted. A few crackles at the bases. No rhonchi. Cardio: regular rate and rhythm, S1, S2 normal, no murmur, click, rub or gallop GI: soft, non-tender; bowel sounds normal; no masses,  no organomegaly Extremities: extremities normal, atraumatic, no cyanosis or edema Neurologic: No focal deficits  Lab Results:  Data Reviewed: I have personally reviewed following labs and imaging studies  CBC:  Recent Labs Lab 03/31/16 2015 04/02/16 0141 04/03/16 0451 04/04/16 0236  WBC 8.1 13.7* 11.2* 12.5*  NEUTROABS 4.0 9.5*  --   --   HGB 12.0 11.5* 11.1* 10.8*  HCT 37.7 35.6* 35.1* 34.2*  MCV 78.2 76.2* 76.8* 77.0*  PLT 406* 414* 412* 415*    Basic  Metabolic Panel:  Recent Labs Lab 03/31/16 2015 04/02/16 0141 04/03/16 0451 04/04/16 0236  NA 139 135 136 137  K 3.3* 3.9 4.1 3.8  CL 100* 95* 94* 91*  CO2 28 22 27  33*  GLUCOSE 169* 352* 306* 300*  BUN 13 20 28* 31*  CREATININE 1.37* 1.41* 1.40* 1.30*  CALCIUM 9.3 9.3 9.2 8.6*    GFR: Estimated Creatinine Clearance: 57.8 mL/min (by C-G formula based on SCr of 1.3 mg/dL (H)).  Liver Function Tests:  Recent Labs Lab 03/31/16 2015  AST 42*  ALT 50  ALKPHOS 51  BILITOT 0.2*  PROT 7.6  ALBUMIN 3.7    HbA1C:  Recent Labs  04/02/16 0908  HGBA1C 7.6*    CBG:  Recent  Labs Lab 04/03/16 0550 04/03/16 1112 04/03/16 1551 04/03/16 2130 04/04/16 0746  GLUCAP 341* 348* 263* 333* 292*     Recent Results (from the past 240 hour(s))  MRSA PCR Screening     Status: Abnormal   Collection Time: 04/02/16  3:51 PM  Result Value Ref Range Status   MRSA by PCR POSITIVE (A) NEGATIVE Final    Comment:        The GeneXpert MRSA Assay (FDA approved for NASAL specimens only), is one component of a comprehensive MRSA colonization surveillance program. It is not intended to diagnose MRSA infection nor to guide or monitor treatment for MRSA infections. RESULT CALLED TO, READ BACK BY AND VERIFIED WITHWilson Singer RN 1157 04/02/16 A BROWNING       Radiology Studies: Dg Chest Port 1 View  Result Date: 04/03/2016 CLINICAL DATA:  Hypoxia today. EXAM: PORTABLE CHEST 1 VIEW COMPARISON:  PA and lateral chest 03/31/2016 and 04/02/2016. CT chest 02/10/2013. FINDINGS: There is mild cardiomegaly without edema. No consolidative process, pneumothorax or effusion. No acute bony abnormality. Aortic atherosclerosis noted. IMPRESSION: Cardiomegaly without acute disease. Electronically Signed   By: Inge Rise M.D.   On: 04/03/2016 13:01     Medications:  Scheduled: . budesonide (PULMICORT) nebulizer solution  0.25 mg Nebulization BID  . cefTRIAXone (ROCEPHIN)  IV  2 g Intravenous Q24H  . Chlorhexidine Gluconate Cloth  6 each Topical Q0600  . doxycycline  100 mg Oral Q12H  . DULoxetine  60 mg Oral BID  . estradiol  2 mg Oral Daily  . fenofibrate  54 mg Oral Daily  . folic acid  1 mg Oral BID  . gabapentin  300 mg Oral QHS  . insulin aspart  0-20 Units Subcutaneous TID WC  . insulin aspart  0-5 Units Subcutaneous QHS  . insulin glargine  15 Units Subcutaneous Daily  . ipratropium-albuterol  3 mL Nebulization Q4H  . mouth rinse  15 mL Mouth Rinse BID  . methylPREDNISolone (SOLU-MEDROL) injection  60 mg Intravenous Q6H  . mupirocin ointment  1 application Nasal BID  .  pantoprazole  40 mg Oral BID  . potassium chloride  30 mEq Oral Daily  . rOPINIRole  4 mg Oral BID  . rosuvastatin  10 mg Oral Q Sat-1800  . sodium chloride flush  3 mL Intravenous Q12H  . torsemide  60 mg Oral Daily   Continuous: . heparin 1,600 Units/hr (04/04/16 0135)   WIO:MBTDHRCBULAGT **OR** acetaminophen, aluminum hydroxide-magnesium carbonate, baclofen, benzonatate, guaiFENesin-codeine, LORazepam, ondansetron **OR** ondansetron (ZOFRAN) IV, ondansetron, traMADol, traZODone  Assessment/Plan:  Principal Problem:   Respiratory distress Active Problems:   Hypertension   Hyperlipidemia   Mild persistent chronic asthma without complication   Chronic diastolic heart failure ([place])  Morbid obesity ([place])   Acute bronchitis   Acute renal failure superimposed on stage 2 chronic kidney disease (HCC)   Acute respiratory failure with hypoxia (HCC)    Acute Hypoxic respiratory failure likely related to bronchitis in the setting of small airway disease and influenza. Chest x-ray was repeated which again did not show any acute changes. Appreciate pulmonology input. VQ scan could not be done yesterday as the patient could not tolerated. This will be attempted today. Continue IV heparin empirically for now. Lower extremity Doppler studies negative for DVT. Continue antibiotics and steroids. Continue Tamiflu for now. Echocardiogram was done yesterday which shows normal systolic function. RV function was noted to be normal.  Acute bronchitis in the setting of small airway disease/persistent chronic asthma, obstructive sleep apnea.  Continue antibiotics. See above. BIPAP.  Acute renal failure superimposed on stage II chronic kidney disease.  Creatinine is stable. Continue home medications. No need for any IV fluids currently.   Chronic diastolic heart failure Appears compensated. Echocardiogram as above. Echo 3 years ago reveals an EF of 65% and grade 2 diastolic dysfunction. Daily  weights. Monitor intake and output. She was given a dose of Lasix yesterday. BNP was however normal  Diabetes mellitus type II on oral agents HbA1c 7.6. Patient was started on Lantus yesterday. Elevated CBGs, likely due to steroids. Remains poorly controlled. Increase dose of Lantus. Continue sliding scale coverage.  Essential Hypertension Monitor blood pressures closely.  DVT Prophylaxis: Lovenox    Code Status: Full code  Family Communication: Discussed with the patient  Disposition Plan: Management as outlined above. Await VQ scan.    LOS: 1 day   [facility] Pager [phone] 04/04/2016, 8:38 AM  If 7PM-7AM, please contact night-coverage at [email or web address], password [email or web address]

## 2016-04-04 NOTE — Progress Notes (Signed)
ANTICOAGULATION CONSULT NOTE - Follow-up Consult  Pharmacy Consult for heparin Indication: R/O PE  Allergies  Allergen Reactions  . Almond Oil Anaphylaxis  . Ceclor [Cefaclor] Nausea And Vomiting  . Ciprofloxacin Other (See Comments)    Makes joints and muscles ache  . Levaquin [Levofloxacin] Other (See Comments)    Body aches  . Morphine And Related     Pt. States while in the hospital it affected her breathing, O2 dropped to the 70's  . Sulfa Antibiotics Nausea And Vomiting    Patient Measurements: Height: 5' 5"  (165.1 cm) Weight: 255 lb 1.6 oz (115.7 kg) IBW/kg (Calculated) : 57 Heparin Dosing Weight: 86  Vital Signs: Temp: 98.8 F (37.1 C) (01/27 0751) Temp Source: Oral (01/27 0751) BP: 137/73 (01/27 0751) Pulse Rate: 62 (01/27 0751)  Labs:  Recent Labs  04/02/16 0141 04/03/16 0451 04/03/16 1601 04/03/16 2128 04/03/16 2337 04/04/16 0236 04/04/16 0730  HGB 11.5* 11.1*  --   --   --  10.8*  --   HCT 35.6* 35.1*  --   --   --  34.2*  --   PLT 414* 412*  --   --   --  415*  --   LABPROT  --   --  13.1  --   --   --   --   INR  --   --  0.99  --   --   --   --   HEPARINUNFRC  --   --   --   --  0.15*  --  0.52  CREATININE 1.41* 1.40*  --   --   --  1.30*  --   TROPONINI  --   --  0.03* <0.03  --  <0.03  --     Estimated Creatinine Clearance: 57.8 mL/min (by C-G formula based on SCr of 1.3 mg/dL (H)).   Assessment: 62yo female admitted 1/25 with COPD exacerbation and influenza who developed worsening dyspnea today.  Pt was unable to tolerate VQ scanner and on heparin for presumed PE.  Heparin level therapeutic at 0.52 on gtt at 1600 units/hr. Hgb 10.8, Plts 415, no signs of bleeding  Goal of Therapy:  Heparin level 0.3-0.7 units/ml Monitor platelets by anticoagulation protocol: Yes   Plan:  -Continue heparin gtt to 1600 units/hr -Daily HL, CBC, monitor S/Sx bleeding  Myer Peer Grayland Ormond), PharmD  PGY1 Pharmacy Resident Pager: [phone] 04/04/2016 9:36  AM

## 2016-04-04 NOTE — Progress Notes (Signed)
Name: Kathleen Hatfield MRN: [id number] DOB: 09-21-1955    ADMISSION DATE:  04/02/2016 CONSULTATION DATE:  1/26  REFERRING MD :  [name]   CHIEF COMPLAINT:  Acute hypoxic respiratory failure   BRIEF PATIENT DESCRIPTION:  Obese female f/b AD in our clinic for mild chronic asthma, obesity with restrictive changes  and sleep apnea. Also has h/o diastolic dysfxn, CKD and DM. Admitted w/ influenza A, purulent bronchitis and asthmatic exacerbation on 1/25. PCCM asked to see for progressive hypoxia seemingly out of proportion to her CXR changes.   SIGNIFICANT EVENTS  1/25 admitted 1/26 transferred to SDU for worsening hypoxia  STUDIES:  ECHO 8/36> GI diastolic dysfunction  LE [place] 1/26> neg  VQ 1/26>>> PCT 1/26 = .14   Cultures  ABX Rocephin 1/26>>> Doxy 1/26>>>  HISTORY OF PRESENT ILLNESS:   This is a 61 year old female w/ sig h/o diastolic HF grade II, CKD stage II, and DM. Was in [place] until 1/21 when developed sore throat, watery eyes and some increased shortness of breath requiring rescue SABA. This continued to worsen and she eventually went to urgent care on 1/22 at that point she tested + for Influenza A and was started on Tamiflu. Her breathing continued to worsen so she went to the ER on 1/23 improved some w/ nebs and steroids so was sent home w/ pred taper, instructions to cont BDs and tamiflu. She returned to the ER 1/25 w/ worsening shortness of breath and associated anxiety. SHe was admitted for further eval. W/ working dx of asthmatic exacerbation, bronchitis and influenza. PCCM asked to see on 1/26 as Oxygen requirements were increasing.     SUBJECTIVE:  Rested well overnight on bipap Comfortable lying back at 45 degrees on 10lpm FM  Dry raspy cough/ voice hoarse     VITAL SIGNS: Temp:  [98 F (36.7 C)-99.1 F (37.3 C)] 98.8 F (37.1 C) (01/27 0751) Pulse Rate:  [59-81] 62 (01/27 0751) Resp:  [13-22] 14 (01/27 0751) BP: (122-156)/(65-89) 137/73 (01/27 0751) SpO2:   [90 %-100 %] 94 % (01/27 1149) FiO2 (%):  [50 %] 50 % (01/27 0354) Weight:  [255 lb 1.6 oz (115.7 kg)] 255 lb 1.6 oz (115.7 kg) (01/27 0351)  PHYSICAL EXAMINATION: General appearance:   , obese wf  & in nodistress, awake, anxious but conversant  Eyes: anicteric, moist conjunctivae; PERRL, EOMI bilaterally. Mouth:  membranes and no mucosal ulcerations; normal hard and soft palate Neck: Trachea midline; neck supple, no JVD Lungs/chest:  Minimal insp and exp rhonchi bilaterally  CV: RRR, no MRGs  Abdomen: Soft, non-tender; no masses or HSM Extremities: No peripheral edema or extremity lymphadenopathy Skin: Normal temperature, turgor and texture; no rash, ulcers or subcutaneous nodules Neuro/Psych: Appropriate affect, alert and oriented to person, place and time - no motor def    CBC Recent Labs     04/02/16  0141  04/03/16  0451  04/04/16  0236  WBC  13.7*  11.2*  12.5*  HGB  11.5*  11.1*  10.8*  HCT  35.6*  35.1*  34.2*  PLT  414*  412*  415*    Coag's Recent Labs     04/03/16  1601  INR  0.99    BMET Recent Labs     04/02/16  0141  04/03/16  0451  04/04/16  0236  NA  135  136  137  K  3.9  4.1  3.8  CL  95*  94*  91*  CO2  22  27  33*  BUN  20  28*  31*  CREATININE  1.41*  1.40*  1.30*  GLUCOSE  352*  306*  300*    Electrolytes Recent Labs     04/02/16  0141  04/03/16  0451  04/04/16  0236  CALCIUM  9.3  9.2  8.6*    Sepsis Markers Recent Labs     04/03/16  1601  04/04/16  0236  PROCALCITON  0.13  0.14    ABG Recent Labs     04/03/16  1215  PHART  7.456*  PCO2ART  45.2  PO2ART  60.9*    Liver Enzymes No results for input(s): AST, ALT, ALKPHOS, BILITOT, ALBUMIN in the last 72 hours.  Cardiac Enzymes Recent Labs     04/03/16  1601  04/03/16  2128  04/04/16  0236  TROPONINI  0.03*  <0.03  <0.03   BNP  51.4  04/03/16    Glucose Recent Labs     04/03/16  0550  04/03/16  1112  04/03/16  1551  04/03/16  2130  04/04/16  0746   04/04/16  1122  GLUCAP  341*  348*  263*  333*  292*  363*    Imaging Dg Chest Port 1 View  Result Date: 04/03/2016 CLINICAL DATA:  Hypoxia today. EXAM: PORTABLE CHEST 1 VIEW COMPARISON:  PA and lateral chest 03/31/2016 and 04/02/2016. CT chest 02/10/2013. FINDINGS: There is mild cardiomegaly without edema. No consolidative process, pneumothorax or effusion. No acute bony abnormality. Aortic atherosclerosis noted. IMPRESSION: Cardiomegaly without acute disease. Electronically Signed   By: Inge Rise M.D.   On: 04/03/2016 13:01   Possible evolving interstitial changes.    ASSESSMENT / PLAN:  Acute hypoxic respiratory failure  Asthmatic exacerbation in setting of mild chronic asthma with restricive changes due to obesity Influenza A Purulent bronchitis Obesity OSA GERD  Known grade II diastolic dysfxn CKI  Acute hypoxic respiratory failure in setting of Influenza c/b purulent bronchitis and asthmatic exacerbation superimposed on underlying OSA. Can't exclude thromboembolic, but suspect evolving pneumonitis and bronchospasm explain her hypoxia. Her plain film is difficult to interpret but it does appear as though she has some early interstitial changes. Ideally a CT of chest would be helpful here but she can't tolerate lying flat and from a stand-point of thromboembolic work-up she has CKI so can't get Dye load.     Plan  Cycle CEs> neg so far  BIPAP (w/ PRN rests) Cont BDs Cont systemic steroids Add rocephin and cont doxy Agree w/ empiric heparin for now/ v/q still pending  Lasix IV X 1 and cont Demadex  Increased  PPI 1/26/1 to decrease reflux contribution to bronchospasm      Christinia Gully, MD [facility] [place] [phone] After 5:30 PM or weekends, use [facility] [phone]

## 2016-04-05 ENCOUNTER — Inpatient Hospital Stay (HOSPITAL_COMMUNITY): Payer: Medicare HMO

## 2016-04-05 DIAGNOSIS — N289 Disorder of kidney and ureter, unspecified: Secondary | ICD-10-CM

## 2016-04-05 LAB — CBC
HCT: 36.5 % (ref 36.0–46.0)
Hemoglobin: 11.5 g/dL — ABNORMAL LOW (ref 12.0–15.0)
MCH: 24.2 pg — ABNORMAL LOW (ref 26.0–34.0)
MCHC: 31.5 g/dL (ref 30.0–36.0)
MCV: 76.7 fL — ABNORMAL LOW (ref 78.0–100.0)
Platelets: 451 10*3/uL — ABNORMAL HIGH (ref 150–400)
RBC: 4.76 MIL/uL (ref 3.87–5.11)
RDW: 17.3 % — ABNORMAL HIGH (ref 11.5–15.5)
WBC: 16.7 10*3/uL — ABNORMAL HIGH (ref 4.0–10.5)

## 2016-04-05 LAB — BASIC METABOLIC PANEL
Anion gap: 15 (ref 5–15)
BUN: 36 mg/dL — ABNORMAL HIGH (ref 6–20)
CO2: 33 mmol/L — ABNORMAL HIGH (ref 22–32)
Calcium: 8.9 mg/dL (ref 8.9–10.3)
Chloride: 90 mmol/L — ABNORMAL LOW (ref 101–111)
Creatinine, Ser: 1.32 mg/dL — ABNORMAL HIGH (ref 0.44–1.00)
GFR calc Af Amer: 49 mL/min — ABNORMAL LOW (ref >60)
GFR calc non Af Amer: 43 mL/min — ABNORMAL LOW (ref >60)
Glucose, Bld: 274 mg/dL — ABNORMAL HIGH (ref 65–99)
Potassium: 4.2 mmol/L (ref 3.5–5.1)
Sodium: 138 mmol/L (ref 135–145)

## 2016-04-05 LAB — HEPARIN LEVEL (UNFRACTIONATED): Heparin Unfractionated: 0.48 IU/mL (ref 0.30–0.70)

## 2016-04-05 LAB — PROCALCITONIN: Procalcitonin: <0.1 ng/mL

## 2016-04-05 LAB — GLUCOSE, CAPILLARY
Glucose-Capillary: 242 mg/dL — ABNORMAL HIGH (ref 65–99)
Glucose-Capillary: 320 mg/dL — ABNORMAL HIGH (ref 65–99)
Glucose-Capillary: 308 mg/dL — ABNORMAL HIGH (ref 65–99)
Glucose-Capillary: 316 mg/dL — ABNORMAL HIGH (ref 65–99)

## 2016-04-05 MED ORDER — ALBUTEROL SULFATE (2.5 MG/3ML) 0.083% IN NEBU
2.5000 mg | INHALATION_SOLUTION | RESPIRATORY_TRACT | Status: DC | PRN
  Administered 2016-04-06 – 2016-04-07 (×5): 2.5 mg via RESPIRATORY_TRACT
  Filled 2016-04-05 (×6): qty 3

## 2016-04-05 MED ORDER — TECHNETIUM TO 99M ALBUMIN AGGREGATED
4.3800 | Freq: Once | INTRAVENOUS | Status: AC | PRN
  Administered 2016-04-05: 4.38 via INTRAVENOUS

## 2016-04-05 MED ORDER — ARFORMOTEROL TARTRATE 15 MCG/2ML IN NEBU
15.0000 ug | INHALATION_SOLUTION | Freq: Two times a day (BID) | RESPIRATORY_TRACT | Status: DC
  Administered 2016-04-05 – 2016-04-09 (×8): 15 ug via RESPIRATORY_TRACT
  Filled 2016-04-05 (×9): qty 2

## 2016-04-05 MED ORDER — TECHNETIUM TC 99M DIETHYLENETRIAME-PENTAACETIC ACID: 32.8000 | Freq: Once | INTRAVENOUS | Status: DC | PRN

## 2016-04-05 NOTE — Progress Notes (Signed)
ANTICOAGULATION CONSULT NOTE - Follow-up Consult  Pharmacy Consult for heparin Indication: R/O PE  Allergies  Allergen Reactions  . Almond Oil Anaphylaxis  . Ceclor [Cefaclor] Nausea And Vomiting  . Ciprofloxacin Other (See Comments)    Makes joints and muscles ache  . Levaquin [Levofloxacin] Other (See Comments)    Body aches  . Morphine And Related     Pt. States while in the hospital it affected her breathing, O2 dropped to the 70's  . Sulfa Antibiotics Nausea And Vomiting    Patient Measurements: Height: 5' 5"  (165.1 cm) Weight: 258 lb (117 kg) IBW/kg (Calculated) : 57 Heparin Dosing Weight: 86  Vital Signs: Temp: 98.6 F (37 C) (01/28 0400) Temp Source: Oral (01/28 0400) BP: 139/74 (01/28 0400) Pulse Rate: 78 (01/28 0400)  Labs:  Recent Labs  04/03/16 0451 04/03/16 1601 04/03/16 2128 04/03/16 2337 04/04/16 0236 04/04/16 0730 04/05/16 0317  HGB 11.1*  --   --   --  10.8*  --  11.5*  HCT 35.1*  --   --   --  34.2*  --  36.5  PLT 412*  --   --   --  415*  --  451*  LABPROT  --  13.1  --   --   --   --   --   INR  --  0.99  --   --   --   --   --   HEPARINUNFRC  --   --   --  0.15*  --  0.52 0.48  CREATININE 1.40*  --   --   --  1.30*  --  1.32*  TROPONINI  --  0.03* <0.03  --  <0.03  --   --     Estimated Creatinine Clearance: 57.2 mL/min (by C-G formula based on SCr of 1.32 mg/dL (H)).   Assessment: 61yo female admitted 1/25 with COPD exacerbation and influenza who developed worsening dyspnea today.  Pt was unable to tolerate VQ scanner and on heparin for presumed PE. TTE and US show no evidence of clots. Heparin level therapeutic at 0.48 on gtt at 1600 units/hr. CBC stable, no signs of bleeding  Goal of Therapy:  Heparin level 0.3-0.7 units/ml Monitor platelets by anticoagulation protocol: Yes   Plan:  -Continue heparin gtt to 1600 units/hr -Daily HL, CBC, monitor S/Sx bleeding  Myer Peer Grayland Ormond), PharmD  PGY1 Pharmacy Resident Pager:  [phone] 04/05/2016 7:21 AM

## 2016-04-05 NOTE — Progress Notes (Signed)
[facility] PROGRESS NOTE  Kathleen Hatfield:[id number] DOB: 17-Mar-1955 DOA: 04/02/2016  PCP: Ann Held, MD  Brief History/Interval Summary: 61 year old Caucasian female with a past medical history of obesity, hypertension, small airway disease/asthma, obstructive sleep apnea, presented with complaints of shortness of breath and cough. Patient was recently diagnosed with the influenza and was started on Tamiflu. She was thought to have acute bronchitis. On 1/26 patient started feeling poorly. She had more shortness of breath and was noted to be more hypoxic. She was transferred to stepdown unit. Placed on BiPAP. Critical care medicine was consulted.  Reason for Visit: Acute bronchitis  Consultants: Pulmonology  Procedures:  Lower extremity venous Doppler is negative for DVT  Transthoracic echocardiogram Study Conclusions  - Left ventricle: The cavity size was normal. Wall thickness was   increased in a pattern of mild LVH. Systolic function was   vigorous. The estimated ejection fraction was in the range of 65%   to 70%. Wall motion was normal; there were no regional wall   motion abnormalities. Doppler parameters are consistent with   abnormal left ventricular relaxation (grade 1 diastolic   dysfunction).  Impressions:  - Vigorous LV systolic function; mild LVH; grade 1 diastolic   dysfunction; elevated LVOT gradient (2.6 m/s) likely related to   hyperdynamic LV function; aortic valve visually appears to open   well.  Antibiotics: Currently on Tamiflu, doxycycline and ceftriaxone  Subjective/Interval History: Patient feels better today. States that she is able to lie flat on the bed. Denies any chest pain. Continues to have dry cough.    ROS: Denies any nausea or vomiting  Objective:  Vital Signs  Vitals:   04/04/16 2144 04/05/16 0000 04/05/16 0400 04/05/16 0814  BP:  134/71 139/74 135/75  Pulse:  61 78 (!) 57  Resp:  18 16 20   Temp:  99.3 F (37.4 C)  98.6 F (37 C) 98.8 F (37.1 C)  TempSrc:  Axillary Oral Axillary  SpO2: 99% 95% 94% 96%  Weight:   117 kg (258 lb)   Height:        Intake/Output Summary (Last 24 hours) at 04/05/16 0842 Last data filed at 04/05/16 0837  Gross per 24 hour  Intake           926.87 ml  Output             2975 ml  Net         -2048.13 ml   Filed Weights   04/03/16 0542 04/04/16 0351 04/05/16 0400  Weight: 120.6 kg (265 lb 12.8 oz) 115.7 kg (255 lb 1.6 oz) 117 kg (258 lb)    General appearance: alert, cooperative, appears stated age and no distress Resp: Coarse breath sounds bilaterally. Scattered wheezing is noted. Less so than yesterday. No crackles. No rhonchi. Cardio: regular rate and rhythm, S1, S2 normal, no murmur, click, rub or gallop GI: soft, non-tender; bowel sounds normal; no masses,  no organomegaly Extremities: extremities normal, atraumatic, no cyanosis or edema Neurologic: No focal deficits appreciated  Lab Results:  Data Reviewed: I have personally reviewed following labs and imaging studies  CBC:  Recent Labs Lab 03/31/16 2015 04/02/16 0141 04/03/16 0451 04/04/16 0236 04/05/16 0317  WBC 8.1 13.7* 11.2* 12.5* 16.7*  NEUTROABS 4.0 9.5*  --   --   --   HGB 12.0 11.5* 11.1* 10.8* 11.5*  HCT 37.7 35.6* 35.1* 34.2* 36.5  MCV 78.2 76.2* 76.8* 77.0* 76.7*  PLT 406* 414* 412* 415* 451*  Basic Metabolic Panel:  Recent Labs Lab 03/31/16 2015 04/02/16 0141 04/03/16 0451 04/04/16 0236 04/05/16 0317  NA 139 135 136 137 138  K 3.3* 3.9 4.1 3.8 4.2  CL 100* 95* 94* 91* 90*  CO2 28 22 27  33* 33*  GLUCOSE 169* 352* 306* 300* 274*  BUN 13 20 28* 31* 36*  CREATININE 1.37* 1.41* 1.40* 1.30* 1.32*  CALCIUM 9.3 9.3 9.2 8.6* 8.9    GFR: Estimated Creatinine Clearance: 57.2 mL/min (by C-G formula based on SCr of 1.32 mg/dL (H)).  Liver Function Tests:  Recent Labs Lab 03/31/16 2015  AST 42*  ALT 50  ALKPHOS 51  BILITOT 0.2*  PROT 7.6  ALBUMIN 3.7     HbA1C:  Recent Labs  04/02/16 0908  HGBA1C 7.6*    CBG:  Recent Labs Lab 04/04/16 0746 04/04/16 1122 04/04/16 1738 04/04/16 2120 04/05/16 0812  GLUCAP 292* 363* 304* 287* 242*     Recent Results (from the past 240 hour(s))  MRSA PCR Screening     Status: Abnormal   Collection Time: 04/02/16  3:51 PM  Result Value Ref Range Status   MRSA by PCR POSITIVE (A) NEGATIVE Final    Comment:        The GeneXpert MRSA Assay (FDA approved for NASAL specimens only), is one component of a comprehensive MRSA colonization surveillance program. It is not intended to diagnose MRSA infection nor to guide or monitor treatment for MRSA infections. RESULT CALLED TO, READ BACK BY AND VERIFIED WITHWilson Singer RN 0923 04/02/16 A BROWNING       Radiology Studies: Dg Chest Port 1 View  Result Date: 04/03/2016 CLINICAL DATA:  Hypoxia today. EXAM: PORTABLE CHEST 1 VIEW COMPARISON:  PA and lateral chest 03/31/2016 and 04/02/2016. CT chest 02/10/2013. FINDINGS: There is mild cardiomegaly without edema. No consolidative process, pneumothorax or effusion. No acute bony abnormality. Aortic atherosclerosis noted. IMPRESSION: Cardiomegaly without acute disease. Electronically Signed   By: Inge Rise M.D.   On: 04/03/2016 13:01     Medications:  Scheduled: . budesonide (PULMICORT) nebulizer solution  0.25 mg Nebulization BID  . cefTRIAXone (ROCEPHIN)  IV  1 g Intravenous Q24H  . Chlorhexidine Gluconate Cloth  6 each Topical Q0600  . doxycycline  100 mg Oral Q12H  . DULoxetine  60 mg Oral BID  . estradiol  2 mg Oral Daily  . fenofibrate  54 mg Oral Daily  . folic acid  1 mg Oral BID  . gabapentin  300 mg Oral QHS  . insulin aspart  0-20 Units Subcutaneous TID WC  . insulin aspart  0-5 Units Subcutaneous QHS  . insulin glargine  25 Units Subcutaneous Daily  . ipratropium-albuterol  3 mL Nebulization Q4H  . mouth rinse  15 mL Mouth Rinse BID  . methylPREDNISolone (SOLU-MEDROL)  injection  80 mg Intravenous Q12H  . mupirocin ointment  1 application Nasal BID  . oseltamivir  30 mg Oral BID  . pantoprazole  40 mg Oral BID AC  . potassium chloride  30 mEq Oral Daily  . rOPINIRole  4 mg Oral BID  . rosuvastatin  10 mg Oral Q Sat-1800  . sodium chloride flush  3 mL Intravenous Q12H  . torsemide  60 mg Oral Daily   Continuous: . heparin 1,600 Units/hr (04/05/16 0837)   RAQ:[id number] **OR** acetaminophen, aluminum hydroxide-magnesium carbonate, baclofen, benzonatate, guaiFENesin-codeine, LORazepam, ondansetron **OR** ondansetron (ZOFRAN) IV, ondansetron, traMADol, traZODone  Assessment/Plan:  Principal Problem:   Respiratory distress Active Problems:  Hypertension   Hyperlipidemia   Mild persistent chronic asthma without complication   Chronic diastolic heart failure (HCC)   Morbid obesity (HCC)   Acute bronchitis   Acute renal failure superimposed on stage 2 chronic kidney disease (HCC)   Acute respiratory failure with hypoxia (HCC)    Acute Hypoxic respiratory failure likely related to bronchitis in the setting of small airway disease and influenza. Etiology for her decompensation and hypoxia not entirely clear. VQ scan is still pending and will be done today. Symptoms could be due to small airway disease. BNP was normal. No evidence for pulmonary edema on examination or chest x-ray. Echocardiogram shows normal systolic function. Continue IV heparin for now. Lower extremity Dopplers negative for DVT. Continue antibiotics and steroids. Continue Tamiflu for now.   Acute bronchitis in the setting of small airway disease/persistent chronic asthma, obstructive sleep apnea.  Continue antibiotics. See above. BIPAP when necessary.  Acute renal failure superimposed on stage II chronic kidney disease.  Creatinine is stable. Continue home medications. No need for any IV fluids currently. She remains on torsemide.  Chronic diastolic heart failure Appears  compensated. Echocardiogram report as above. Echo 3 years ago reveals an EF of 65% and grade 2 diastolic dysfunction. Daily weights. Monitor intake and output. She was given a dose of Lasix 1/26. BNP was however normal. She remains on torsemide.  Diabetes mellitus type II on oral agents HbA1c 7.6. Patient was started on Lantus and dose was increased on 1/27. CBGs are slightly better but remain elevated. Anticipate cutting back on the dose steroids tomorrow, so we will hold off on changing the dose of Lantus for now. Continue sliding scale coverage.  Essential Hypertension Monitor blood pressures closely.  DVT Prophylaxis: Lovenox    Code Status: Full code  Family Communication: Discussed with the patient  Disposition Plan: Management as outlined above. Await VQ scan. Remain in step down for now.    LOS: 2 days   [facility] Pager [phone] 04/05/2016, 8:42 AM  If 7PM-7AM, please contact night-coverage at [email or web address], password [facility]

## 2016-04-05 NOTE — Progress Notes (Signed)
Name: Kathleen Hatfield MRN: [id number] DOB: Feb 23, 1956    ADMISSION DATE:  04/02/2016 CONSULTATION DATE:  1/26  REFERRING MD :  [name]   CHIEF COMPLAINT:  Acute hypoxic respiratory failure   BRIEF PATIENT DESCRIPTION:  Obese female f/b AD in our clinic for mild chronic asthma, obesity with restrictive changes  and sleep apnea. Also has h/o diastolic dysfxn, CKD and DM. Admitted w/ influenza A, purulent bronchitis and asthmatic exacerbation on 1/25. PCCM asked to see for progressive hypoxia seemingly out of proportion to her CXR changes.   SIGNIFICANT EVENTS  1/25 admitted 1/26 transferred to SDU for worsening hypoxia  STUDIES:  ECHO 4/09> GI diastolic dysfunction  LE [place] 1/26> neg  VQ 1/26>>> PCT 1/26 = .14  And < .10 on 1/28       ABX Rocephin 1/26>>> Doxy 1/26>>>     SUBJECTIVE:  Rested well overnight on bipap but sob with minimal activity and still with harsh upper airway dry cough    VITAL SIGNS: Temp:  [98.1 F (36.7 C)-99.3 F (37.4 C)] 98.8 F (37.1 C) (01/28 0814) Pulse Rate:  [57-78] 57 (01/28 0814) Resp:  [12-20] 20 (01/28 0814) BP: (120-139)/(68-75) 135/75 (01/28 0814) SpO2:  [92 %-99 %] 96 % (01/28 0913) FiO2 (%):  [45 %-50 %] 45 % (01/28 0913) Weight:  [258 lb (117 kg)] 258 lb (117 kg) (01/28 0400)  PHYSICAL EXAMINATION: General appearance:   , obese wf  & in no distress, awake, anxious and very depressed   Eyes:  moist conjunctivae;   Mouth:  membranes and no mucosal ulcerations; n  Neck: Trachea midline; neck supple, no JVD Lungs/chest:  Exp > insp  rhonchi bilaterally  CV: RRR, no MRGs  Abdomen: Soft, non-tender; no masses or HSM Extremities: No peripheral edema or extremity lymphadenopathy Skin: no breakdown/ warm and dry/ no rash  Neuro/Psych: Alert, nl sensorium   CBC Recent Labs     04/03/16  0451  04/04/16  0236  04/05/16  0317  WBC  11.2*  12.5*  16.7*  HGB  11.1*  10.8*  11.5*  HCT  35.1*  34.2*  36.5  PLT  412*  415*  451*     Coag's Recent Labs     04/03/16  1601  INR  0.99    BMET Recent Labs     04/03/16  0451  04/04/16  0236  04/05/16  0317  NA  136  137  138  K  4.1  3.8  4.2  CL  94*  91*  90*  CO2  27  33*  33*  BUN  28*  31*  36*  CREATININE  1.40*  1.30*  1.32*  GLUCOSE  306*  300*  274*    Electrolytes Recent Labs     04/03/16  0451  04/04/16  0236  04/05/16  0317  CALCIUM  9.2  8.6*  8.9    Sepsis Markers Recent Labs     04/03/16  1601  04/04/16  0236  04/05/16  0317  PROCALCITON  0.13  0.14  <0.10    ABG Recent Labs     04/03/16  1215  PHART  7.456*  PCO2ART  45.2  PO2ART  60.9*    Liver Enzymes No results for input(s): AST, ALT, ALKPHOS, BILITOT, ALBUMIN in the last 72 hours.  Cardiac Enzymes Recent Labs     04/03/16  1601  04/03/16  2128  04/04/16  0236  TROPONINI  0.03*  <0.03  <  0.03   BNP  51.4  04/03/16    Glucose Recent Labs     04/03/16  2130  04/04/16  0746  04/04/16  1122  04/04/16  1738  04/04/16  2120  04/05/16  0812  GLUCAP  333*  292*  363*  304*  287*  242*    Imaging Dg Chest Port 1 View  Result Date: 04/03/2016 CLINICAL DATA:  Hypoxia today. EXAM: PORTABLE CHEST 1 VIEW COMPARISON:  PA and lateral chest 03/31/2016 and 04/02/2016. CT chest 02/10/2013. FINDINGS: There is mild cardiomegaly without edema. No consolidative process, pneumothorax or effusion. No acute bony abnormality. Aortic atherosclerosis noted. IMPRESSION: Cardiomegaly without acute disease. Electronically Signed   By: Inge Rise M.D.   On: 04/03/2016 13:01       ASSESSMENT / PLAN:  Acute hypoxic respiratory failure  Asthmatic exacerbation in setting of mild chronic asthma with restricive changes due to obesity Influenza A Purulent bronchitis Obesity OSA GERD  Known grade II diastolic dysfxn CKI  Acute hypoxic respiratory failure in setting of Influenza c/b purulent bronchitis and asthmatic exacerbation superimposed on underlying OSA. Can't  exclude thromboembolic, but suspect evolving pneumonitis and bronchospasm explain her hypoxia. Her plain film is difficult to interpret but it does appear as though she has some early interstitial changes. Ideally a CT of chest would be helpful here but she can't tolerate lying flat and from a stand-point of thromboembolic work-up she has CKI so can't get Dye load.     Plan  Cycle CEs> neg  r  BIPAP (w/ PRN rests) Cont BDs Cont systemic steroids Add rocephin and cont doxy Agree w/ empiric heparin for now/ v/q still pending  cont Demadex  Increased  PPI 1/26   to decrease reflux contribution to bronchospasm  Changed nebs to laba/ics bid and prn saba (no atrovent) as this is not copd   Prolonged discussion with patient. She is convinced that her problem is cardiac because she says that she was told she did not have bad asthma and yet she has had a progressive decline over the last 6 months in terms of her best day function limited by dyspnea which I think is largely related to obesity with deconditioning based on the minimal airflow obstruction that she demonstrated on her last visit and note BNP < 100 this admit despite   dx of diastolic dysfunction confired this admit so doubt we need to do anything different here other than to keep her on the dry side as tolerated.   Christinia Gully, MD [facility] and [place] [phone] After 5:30 PM or weekends, use Beeper [phone]

## 2016-04-06 DIAGNOSIS — J101 Influenza due to other identified influenza virus with other respiratory manifestations: Secondary | ICD-10-CM

## 2016-04-06 DIAGNOSIS — J209 Acute bronchitis, unspecified: Principal | ICD-10-CM

## 2016-04-06 DIAGNOSIS — G4733 Obstructive sleep apnea (adult) (pediatric): Secondary | ICD-10-CM

## 2016-04-06 DIAGNOSIS — R739 Hyperglycemia, unspecified: Secondary | ICD-10-CM

## 2016-04-06 LAB — CBC
HCT: 39.2 % (ref 36.0–46.0)
Hemoglobin: 12.6 g/dL (ref 12.0–15.0)
MCH: 24.8 pg — ABNORMAL LOW (ref 26.0–34.0)
MCHC: 32.1 g/dL (ref 30.0–36.0)
MCV: 77.2 fL — ABNORMAL LOW (ref 78.0–100.0)
Platelets: 489 10*3/uL — ABNORMAL HIGH (ref 150–400)
RBC: 5.08 MIL/uL (ref 3.87–5.11)
RDW: 17.2 % — ABNORMAL HIGH (ref 11.5–15.5)
WBC: 20.8 10*3/uL — ABNORMAL HIGH (ref 4.0–10.5)

## 2016-04-06 LAB — BASIC METABOLIC PANEL
Anion gap: 13 (ref 5–15)
BUN: 33 mg/dL — ABNORMAL HIGH (ref 6–20)
CO2: 36 mmol/L — ABNORMAL HIGH (ref 22–32)
Calcium: 9.5 mg/dL (ref 8.9–10.3)
Chloride: 87 mmol/L — ABNORMAL LOW (ref 101–111)
Creatinine, Ser: 1.39 mg/dL — ABNORMAL HIGH (ref 0.44–1.00)
GFR calc Af Amer: 46 mL/min — ABNORMAL LOW (ref >60)
GFR calc non Af Amer: 40 mL/min — ABNORMAL LOW (ref >60)
Glucose, Bld: 260 mg/dL — ABNORMAL HIGH (ref 65–99)
Potassium: 4.2 mmol/L (ref 3.5–5.1)
Sodium: 136 mmol/L (ref 135–145)

## 2016-04-06 LAB — BLOOD GAS, ARTERIAL
Acid-Base Excess: 7.3 mmol/L — ABNORMAL HIGH (ref 0.0–2.0)
Bicarbonate: 31.4 mmol/L — ABNORMAL HIGH (ref 20.0–28.0)
Drawn by: 275531
O2 Content: 6 L/min
O2 Saturation: 90.6 %
Patient temperature: 98.6
pCO2 arterial: 45.2 mmHg (ref 32.0–48.0)
pH, Arterial: 7.456 — ABNORMAL HIGH (ref 7.350–7.450)
pO2, Arterial: 60.9 mmHg — ABNORMAL LOW (ref 83.0–108.0)

## 2016-04-06 LAB — GLUCOSE, CAPILLARY
Glucose-Capillary: 263 mg/dL — ABNORMAL HIGH (ref 65–99)
Glucose-Capillary: 294 mg/dL — ABNORMAL HIGH (ref 65–99)
Glucose-Capillary: 366 mg/dL — ABNORMAL HIGH (ref 65–99)
Glucose-Capillary: 395 mg/dL — ABNORMAL HIGH (ref 65–99)

## 2016-04-06 LAB — HEPARIN LEVEL (UNFRACTIONATED): Heparin Unfractionated: 0.54 IU/mL (ref 0.30–0.70)

## 2016-04-06 MED ORDER — HEPARIN SODIUM (PORCINE) 5000 UNIT/ML IJ SOLN
5000.0000 [IU] | Freq: Three times a day (TID) | INTRAMUSCULAR | Status: DC
  Administered 2016-04-06 – 2016-04-09 (×9): 5000 [IU] via SUBCUTANEOUS
  Filled 2016-04-06 (×9): qty 1

## 2016-04-06 MED ORDER — INSULIN GLARGINE 100 UNIT/ML ~~LOC~~ SOLN
5.0000 [IU] | Freq: Once | SUBCUTANEOUS | Status: AC
Start: 2016-04-06 — End: 2016-04-06
  Administered 2016-04-06: 5 [IU] via SUBCUTANEOUS
  Filled 2016-04-06: qty 0.05

## 2016-04-06 MED ORDER — BUDESONIDE 0.5 MG/2ML IN SUSP
0.5000 mg | Freq: Two times a day (BID) | RESPIRATORY_TRACT | Status: DC
  Administered 2016-04-06 – 2016-04-09 (×6): 0.5 mg via RESPIRATORY_TRACT
  Filled 2016-04-06 (×6): qty 2

## 2016-04-06 MED ORDER — INSULIN GLARGINE 100 UNIT/ML ~~LOC~~ SOLN
30.0000 [IU] | Freq: Every day | SUBCUTANEOUS | Status: DC
  Administered 2016-04-07 – 2016-04-09 (×3): 30 [IU] via SUBCUTANEOUS
  Filled 2016-04-06 (×3): qty 0.3

## 2016-04-06 MED ORDER — ACETYLCYSTEINE 20 % IN SOLN
4.0000 mL | Freq: Four times a day (QID) | RESPIRATORY_TRACT | Status: AC
  Administered 2016-04-06 – 2016-04-07 (×5): 4 mL via RESPIRATORY_TRACT
  Filled 2016-04-06 (×8): qty 4

## 2016-04-06 NOTE — Progress Notes (Signed)
Physical Therapy Treatment Patient Details Name: Kathleen Hatfield MRN: [id number] DOB: October 09, 1955 Today's Date: 04/06/2016    History of Present Illness Pt is a 61 y/o female admitted from home secondary to dyspnea. PMH including but not limited to obesity with small airway disease, HTN, DM, CHF, CKD, and ulcerative colitis.     PT Comments    Pt making excellent progress with mobility.   Follow Up Recommendations  No PT follow up     Equipment Recommendations  None recommended by PT    Recommendations for Other Services       Precautions / Restrictions Precautions Precautions: None    Mobility  Bed Mobility Overal bed mobility: Modified Independent Bed Mobility: Supine to Sit;Sit to Supine     Supine to sit: Modified independent (Device/Increase time);HOB elevated Sit to supine: Modified independent (Device/Increase time);HOB elevated   General bed mobility comments: increased time, no physical assist, HOB up, pt has been routinely sitting at EOB to eat  Transfers Overall transfer level: Modified independent Equipment used: None Transfers: Sit to/from Stand Sit to Stand: Modified independent (Device/Increase time)         General transfer comment: Did not require use of hands  Ambulation/Gait Ambulation/Gait assistance: Supervision Ambulation Distance (Feet): 25 Feet (25' x 1, 12' x 1) Assistive device: None Gait Pattern/deviations: Step-through pattern;Decreased stride length Gait velocity: decr Gait velocity interpretation: Below normal speed for age/gender General Gait Details: Able to amb, perform turns, and avoid obstacles. SpO2 grossly 88% on RA (difficulty getting good wave form) Replaced O2.   Stairs            Wheelchair Mobility    Modified Rankin (Stroke Patients Only)       Balance Overall balance assessment: Needs assistance Sitting-balance support: No upper extremity supported;Feet supported Sitting balance-Leahy Scale: Good      Standing balance support: During functional activity;No upper extremity supported Standing balance-Leahy Scale: Good                      Cognition Arousal/Alertness: Awake/alert Behavior During Therapy: WFL for tasks assessed/performed Overall Cognitive Status: Within Functional Limits for tasks assessed                      Exercises Other Exercises Other Exercises: Performed repeated sit to stand from EOB x 5 with minimal use of hands.    General Comments        Pertinent Vitals/Pain Pain Assessment: No/denies pain    Home Living Family/patient expects to be discharged to:: Private residence Living Arrangements: Children Available Help at Discharge: Family;Friend(s);Available PRN/intermittently Type of Home: House Home Access: Stairs to enter Entrance Stairs-Rails: None Home Layout: One level Home Equipment: Shower seat Additional Comments: Pt reports she will plans to stay with a friend when she discharges.    Prior Function Level of Independence: Needs assistance  Gait / Transfers Assistance Needed: ambulated independently, but short distances ADL's / Homemaking Assistance Needed: reports being independent in self care and "doing as much as I could" meal prep and housekeeping     PT Goals (current goals can now be found in the care plan section) Acute Rehab PT Goals Patient Stated Goal: feel better Progress towards PT goals: Goals met and updated - see care plan    Frequency    Min 3X/week      PT Plan Current plan remains appropriate    Co-evaluation  End of Session Equipment Utilized During Treatment: Oxygen Activity Tolerance: Patient tolerated treatment well Patient left: in bed;with call bell/phone within reach     Time: 1020-1031 PT Time Calculation (min) (ACUTE ONLY): 11 min  Charges:  $Gait Training: 8-22 mins                    G CodesShary Decamp [place] 14-Apr-2016, 10:38 AM Suanne Marker  PT [phone]

## 2016-04-06 NOTE — Evaluation (Signed)
Occupational Therapy Evaluation Patient Details Name: Kathleen ASHMORE MRN: [id number] DOB: 1955/03/26 Today's Date: 04/06/2016    History of Present Illness Pt is a 61 y/o female admitted from home secondary to dyspnea. PMH including but not limited to obesity with small airway disease, HTN, DM, CHF, CKD, and ulcerative colitis.    Clinical Impression   Pt is independent in self care and is assisted for some meal prep and housekeeping. She walks short distances. Pt presents with decreased activity tolerance and generalized weakness requiring min assist for ADL and mobility. Pt plans to d/c to a friend's home when medically ready. Will follow acutely. Do not anticipate pt will post acute OT.    Follow Up Recommendations  No OT follow up    Equipment Recommendations  None recommended by OT    Recommendations for Other Services       Precautions / Restrictions Precautions Precautions: Fall      Mobility Bed Mobility Overal bed mobility: Needs Assistance Bed Mobility: Supine to Sit;Sit to Supine     Supine to sit: Supervision Sit to supine: Supervision   General bed mobility comments: increased time, no physical assist, HOB up, pt has been routinely sitting at EOB to eat  Transfers Overall transfer level: Needs assistance Equipment used: None Transfers: Sit to/from Stand Sit to Stand: Min guard         General transfer comment: increased time, min guard for safety    Balance     Sitting balance-Leahy Scale: Good       Standing balance-Leahy Scale: Fair                              ADL Overall ADL's : Needs assistance/impaired Eating/Feeding: Independent;Sitting   Grooming: Wash/dry hands;Wash/dry face;Sitting;Set up   Upper Body Bathing: Minimal assistance;Sitting   Lower Body Bathing: Minimal assistance;Sit to/from stand   Upper Body Dressing : Set up;Sitting   Lower Body Dressing: Minimal assistance;Sit to/from stand   Toilet Transfer:  Minimal assistance;Ambulation             General ADL Comments: Pt primarily limited by decreased activity tolerance.     Vision     Perception     Praxis      Pertinent Vitals/Pain Pain Assessment: No/denies pain     Hand Dominance Right   Extremity/Trunk Assessment Upper Extremity Assessment Upper Extremity Assessment: Overall WFL for tasks assessed   Lower Extremity Assessment Lower Extremity Assessment: Defer to PT evaluation   Cervical / Trunk Assessment Cervical / Trunk Assessment: Normal   Communication Communication Communication: No difficulties   Cognition Arousal/Alertness: Awake/alert Behavior During Therapy: WFL for tasks assessed/performed Overall Cognitive Status: Within Functional Limits for tasks assessed                     General Comments       Exercises       Shoulder Instructions      Home Living Family/patient expects to be discharged to:: Private residence Living Arrangements: Children Available Help at Discharge: Family;Friend(s);Available PRN/intermittently Type of Home: House Home Access: Stairs to enter [employer] of Steps: 1 Entrance Stairs-Rails: None Home Layout: One level     Bathroom Shower/Tub: Teacher, early years/pre: Standard     Home Equipment: Shower seat   Additional Comments: Pt reports she will plans to stay with a friend when she discharges.  Prior Functioning/Environment Level of Independence: Needs assistance  Gait / Transfers Assistance Needed: ambulated independently, but short distances ADL's / Homemaking Assistance Needed: reports being independent in self care and "doing as much as I could" meal prep and housekeeping            OT Problem List: Decreased activity tolerance;Cardiopulmonary status limiting activity;Obesity   OT Treatment/Interventions: Self-care/ADL training;Patient/family education;Energy conservation    OT Goals(Current goals can be  found in the care plan section) Acute Rehab OT Goals Patient Stated Goal: feel better OT Goal Formulation: With patient Time For Goal Achievement: 04/13/16 Potential to Achieve Goals: Good ADL Goals Pt Will Perform Grooming: with supervision;standing (2 activities) Pt Will Perform Lower Body Bathing: with supervision;sit to/from stand Pt Will Perform Lower Body Dressing: with supervision;sit to/from stand Pt Will Transfer to Toilet: with supervision;ambulating;regular height toilet Pt Will Perform Toileting - Clothing Manipulation and hygiene: with supervision;sit to/from stand Pt Will Perform Tub/Shower Transfer: Tub transfer;with supervision;ambulating;shower seat Additional ADL Goal #1: Pt will generalize energy conservation strategies and breathing techniques in ADL and mobility independently.  OT Frequency: Min 2X/week   Barriers to D/C:            Co-evaluation              End of Session Equipment Utilized During Treatment: Oxygen;Gait belt  Activity Tolerance: Patient limited by fatigue Patient left: in bed;with call bell/phone within reach;with nursing/sitter in room   Time: 6295-2841 OT Time Calculation (min): 17 min Charges:  OT General Charges $OT Visit: 1 Procedure OT Evaluation $OT Eval Moderate Complexity: 1 Procedure G-Codes:    Malka So 04/06/2016, 9:05 AM  [phone]

## 2016-04-06 NOTE — Progress Notes (Signed)
[facility] PROGRESS NOTE  Kathleen Hatfield IHK:[id number] DOB: 11/27/1955 DOA: 04/02/2016  PCP: Ann Held, MD  Brief History/Interval Summary: 61 year old Caucasian female with a past medical history of obesity, hypertension, small airway disease/asthma, obstructive sleep apnea, presented with complaints of shortness of breath and cough. Patient was recently diagnosed with the influenza and was started on Tamiflu. She was thought to have acute bronchitis. On 1/26 patient started feeling poorly. She had more shortness of breath and was noted to be more hypoxic. She was transferred to stepdown unit. Placed on BiPAP. Critical care medicine was consulted.  Reason for Visit: Acute bronchitis  Consultants: Pulmonology  Procedures:  Lower extremity venous Doppler is negative for DVT  Transthoracic echocardiogram Study Conclusions  - Left ventricle: The cavity size was normal. Wall thickness was   increased in a pattern of mild LVH. Systolic function was   vigorous. The estimated ejection fraction was in the range of 65%   to 70%. Wall motion was normal; there were no regional wall   motion abnormalities. Doppler parameters are consistent with   abnormal left ventricular relaxation (grade 1 diastolic   dysfunction).  Impressions:  - Vigorous LV systolic function; mild LVH; grade 1 diastolic   dysfunction; elevated LVOT gradient (2.6 m/s) likely related to   hyperdynamic LV function; aortic valve visually appears to open   well.  Antibiotics: Currently on Tamiflu, doxycycline and ceftriaxone  Subjective/Interval History: Patient continues to feel better. Still has a dry cough. States that she is unable to clear her congestion. Denies any chest pain   ROS: Denies any nausea or vomiting  Objective:  Vital Signs  Vitals:   04/05/16 1939 04/05/16 2029 04/06/16 0029 04/06/16 0400  BP: (!) 162/82  (!) 157/89 (!) 145/76  Pulse: 85 77 77 81  Resp: 20 15 16 18   Temp: 99 F  (37.2 C)  99.3 F (37.4 C) 98.2 F (36.8 C)  TempSrc: Oral  Oral Oral  SpO2: 93% 95% 96% 90%  Weight:    116.5 kg (256 lb 14.4 oz)  Height:        Intake/Output Summary (Last 24 hours) at 04/06/16 0810 Last data filed at 04/06/16 0200  Gross per 24 hour  Intake              698 ml  Output             2250 ml  Net            -1552 ml   Filed Weights   04/04/16 0351 04/05/16 0400 04/06/16 0400  Weight: 115.7 kg (255 lb 1.6 oz) 117 kg (258 lb) 116.5 kg (256 lb 14.4 oz)    General appearance: alert, cooperative, appears stated age and no distress Resp: Coarse breath sounds bilaterally. Scattered wheezing is noted. No crackles. No rhonchi. Normal effort. Cardio: regular rate and rhythm, S1, S2 normal, no murmur, click, rub or gallop GI: soft, non-tender; bowel sounds normal; no masses,  no organomegaly Extremities: extremities normal, atraumatic, no cyanosis or edema Neurologic: No focal deficits appreciated  Lab Results:  Data Reviewed: I have personally reviewed following labs and imaging studies  CBC:  Recent Labs Lab 03/31/16 2015 04/02/16 0141 04/03/16 0451 04/04/16 0236 04/05/16 0317 04/06/16 0256  WBC 8.1 13.7* 11.2* 12.5* 16.7* 20.8*  NEUTROABS 4.0 9.5*  --   --   --   --   HGB 12.0 11.5* 11.1* 10.8* 11.5* 12.6  HCT 37.7 35.6* 35.1* 34.2* 36.5 39.2  MCV 78.2 76.2* 76.8* 77.0* 76.7* 77.2*  PLT 406* 414* 412* 415* 451* 489*    Basic Metabolic Panel:  Recent Labs Lab 04/02/16 0141 04/03/16 0451 04/04/16 0236 04/05/16 0317 04/06/16 0256  NA 135 136 137 138 136  K 3.9 4.1 3.8 4.2 4.2  CL 95* 94* 91* 90* 87*  CO2 22 27 33* 33* 36*  GLUCOSE 352* 306* 300* 274* 260*  BUN 20 28* 31* 36* 33*  CREATININE 1.41* 1.40* 1.30* 1.32* 1.39*  CALCIUM 9.3 9.2 8.6* 8.9 9.5    GFR: Estimated Creatinine Clearance: 54.2 mL/min (by C-G formula based on SCr of 1.39 mg/dL (H)).  Liver Function Tests:  Recent Labs Lab 03/31/16 2015  AST 42*  ALT 50  ALKPHOS 51    BILITOT 0.2*  PROT 7.6  ALBUMIN 3.7    HbA1C: No results for input(s): HGBA1C in the last 72 hours.  CBG:  Recent Labs Lab 04/04/16 2120 04/05/16 0812 04/05/16 1403 04/05/16 1701 04/05/16 2117  GLUCAP 287* 242* 320* 308* 316*     Recent Results (from the past 240 hour(s))  MRSA PCR Screening     Status: Abnormal   Collection Time: 04/02/16  3:51 PM  Result Value Ref Range Status   MRSA by PCR POSITIVE (A) NEGATIVE Final    Comment:        The GeneXpert MRSA Assay (FDA approved for NASAL specimens only), is one component of a comprehensive MRSA colonization surveillance program. It is not intended to diagnose MRSA infection nor to guide or monitor treatment for MRSA infections. RESULT CALLED TO, READ BACK BY AND VERIFIED WITHWilson Singer RN 0258 04/02/16 A BROWNING       Radiology Studies: Dg Chest 2 View  Result Date: 04/05/2016 CLINICAL DATA:  Shortness of Breath EXAM: CHEST  2 VIEW COMPARISON:  04/03/2016 FINDINGS: Mild cardiomegaly. No confluent airspace opacities or effusions. No acute bony abnormality. IMPRESSION: Mild cardiomegaly.  No active disease. Electronically Signed   By: Rolm Baptise M.D.   On: 04/05/2016 13:23   Nm Pulmonary Perf And Vent  Result Date: 04/05/2016 CLINICAL DATA:  61 year old female with shortness of breath and hypoxia. EXAM: NUCLEAR MEDICINE VENTILATION - PERFUSION LUNG SCAN TECHNIQUE: Ventilation images were obtained in multiple projections using inhaled aerosol Tc-16mDTPA. Perfusion images were obtained in multiple projections after intravenous injection of Tc-923mAA. RADIOPHARMACEUTICALS:  32.8 mCi Technetium-9921mPA aerosol inhalation and 4.38 mCi Technetium-33m51m IV COMPARISON:  None. FINDINGS: Ventilation: No focal ventilation defect. Perfusion: No wedge shaped peripheral perfusion defects to suggest acute pulmonary embolism. IMPRESSION: Normal.  No evidence of pulmonary emboli. Electronically Signed   By: JeffMargarette Canada.   On:  04/05/2016 13:38     Medications:  Scheduled: . arformoterol  15 mcg Nebulization BID  . budesonide (PULMICORT) nebulizer solution  0.25 mg Nebulization BID  . cefTRIAXone (ROCEPHIN)  IV  1 g Intravenous Q24H  . Chlorhexidine Gluconate Cloth  6 each Topical Q0600  . doxycycline  100 mg Oral Q12H  . DULoxetine  60 mg Oral BID  . estradiol  2 mg Oral Daily  . fenofibrate  54 mg Oral Daily  . folic acid  1 mg Oral BID  . gabapentin  300 mg Oral QHS  . insulin aspart  0-20 Units Subcutaneous TID WC  . insulin aspart  0-5 Units Subcutaneous QHS  . insulin glargine  25 Units Subcutaneous Daily  . mouth rinse  15 mL Mouth Rinse BID  . methylPREDNISolone (SOLU-MEDROL)  injection  80 mg Intravenous Q12H  . mupirocin ointment  1 application Nasal BID  . oseltamivir  30 mg Oral BID  . pantoprazole  40 mg Oral BID AC  . potassium chloride  30 mEq Oral Daily  . rOPINIRole  4 mg Oral BID  . rosuvastatin  10 mg Oral Q Sat-1800  . sodium chloride flush  3 mL Intravenous Q12H  . torsemide  60 mg Oral Daily   Continuous: . heparin 1,600 Units/hr (04/06/16 0434)   NUU:[id number] **OR** acetaminophen, albuterol, aluminum hydroxide-magnesium carbonate, baclofen, benzonatate, guaiFENesin-codeine, LORazepam, ondansetron **OR** ondansetron (ZOFRAN) IV, ondansetron, technetium TC 74M diethylenetriame-pentaacetic acid, traMADol, traZODone  Assessment/Plan:  Principal Problem:   Respiratory distress Active Problems:   Hypertension   Hyperlipidemia   Mild persistent chronic asthma without complication   Chronic diastolic heart failure (HCC)   Morbid obesity (HCC)   Acute bronchitis   Acute renal failure superimposed on stage 2 chronic kidney disease ([place])   Acute respiratory failure with hypoxia (HCC)   Renal insufficiency    Acute Hypoxic respiratory failure likely related to bronchitis in the setting of small airway disease and influenza. Etiology for her decompensation and hypoxia not  entirely clear. Symptoms could be due to small airway disease. BNP was normal. No evidence for pulmonary edema on examination or chest x-ray. Echocardiogram shows normal systolic function.VQ scan was done yesterday and does not reveal any evidence for pulmonary embolism. 10. Discontinue IV heparin as he lower extremity Dopplers were also negative for DVT. Pulmonology is following. Continue steroids, antibiotics. Continue Tamiflu for now.   Acute bronchitis in the setting of small airway disease/persistent chronic asthma, obstructive sleep apnea.  Continue antibiotics. See above. BIPAP when necessary.  Acute renal failure superimposed on stage II chronic kidney disease.  Creatinine is stable. Continue home medications. No need for any IV fluids currently. She remains on torsemide.  Chronic diastolic heart failure Appears compensated. Echocardiogram report as above. Echo 3 years ago reveals an EF of 65% and grade 2 diastolic dysfunction. Daily weights. Monitor intake and output. She was given a dose of Lasix 1/26. BNP was however normal. She remains on torsemide.  Diabetes mellitus type II on oral agents HbA1c 7.6. Patient was started on Lantus and dose was increased on 1/27. CBGs are slightly better but remain elevated. Increase dose of Lantus. Continue sliding scale coverage.  Essential Hypertension Monitor blood pressures closely.  DVT Prophylaxis: Lovenox    Code Status: Full code  Family Communication: Discussed with the patient  Disposition Plan: Management as outlined above. PT evaluation.     LOS: 3 days   [facility] Pager [phone] 04/06/2016, 8:10 AM  If 7PM-7AM, please contact night-coverage at [email or web address], password [facility]

## 2016-04-06 NOTE — Care Management Important Message (Signed)
Important Message  Patient Details  Name: Kathleen Hatfield MRN: [id number] Date of Birth: 09/22/1955   Medicare Important Message Given:  Yes    Lacretia Leigh, RN 04/06/2016, 1:37 PM

## 2016-04-06 NOTE — Progress Notes (Signed)
Name: Kathleen Hatfield MRN: [id number] DOB: 04-11-1955    ADMISSION DATE:  04/02/2016 CONSULTATION DATE:  1/26  REFERRING MD :  [name]   CHIEF COMPLAINT:  Acute hypoxic respiratory failure   BRIEF PATIENT DESCRIPTION:  Obese female f/b AD in our clinic for mild chronic asthma, obesity with restrictive changes  and sleep apnea. Also has h/o diastolic dysfxn, CKD and DM. Admitted w/ influenza A, purulent bronchitis and asthmatic exacerbation on 1/25. PCCM asked to see for progressive hypoxia seemingly out of proportion to her CXR changes.   SIGNIFICANT EVENTS  1/25 admitted 1/26 transferred to SDU for worsening hypoxia  STUDIES:  ECHO 0/94> GI diastolic dysfunction  LE [place] 1/26> neg  VQ 1/26>>> normal, no evidence of PE PCT 1/26 = .14  And < .10 on 1/28   ABX Rocephin 1/26>>> Doxy 1/26>>>     SUBJECTIVE:  Doing well, on 3L [place].  Continues to cough but feels needs to cough up more sputum (unable to cough up much). Net -7.4L.  VITAL SIGNS: Temp:  [98.2 F (36.8 C)-99.3 F (37.4 C)] 98.2 F (36.8 C) (01/29 0853) Pulse Rate:  [58-85] 62 (01/29 0853) Resp:  [15-20] 18 (01/29 0853) BP: (125-162)/(76-89) 141/84 (01/29 0853) SpO2:  [90 %-99 %] 95 % (01/29 0853) FiO2 (%):  [45 %] 45 % (01/28 0913) Weight:  [116.5 kg (256 lb 14.4 oz)] 116.5 kg (256 lb 14.4 oz) (01/29 0400)  PHYSICAL EXAMINATION: General appearance:  obese wf  & in no distress Eyes:  moist conjunctivae   Mouth:  no mucosal ulcerations  Neck: Trachea midline; neck supple, no JVD Lungs/chest:  Normal respiratory effort, clear bilaterally CV: RRR, no MRGs  Abdomen: Soft, non-tender; no masses or HSM Extremities: No peripheral edema or extremity lymphadenopathy Skin: no breakdown/ warm and dry/ no rash  Neuro/Psych: Alert, nl sensorium   CBC Recent Labs     04/04/16  0236  04/05/16  0317  04/06/16  0256  WBC  12.5*  16.7*  20.8*  HGB  10.8*  11.5*  12.6  HCT  34.2*  36.5  39.2  PLT  415*  451*  489*     Coag's Recent Labs     04/03/16  1601  INR  0.99    BMET Recent Labs     04/04/16  0236  04/05/16  0317  04/06/16  0256  NA  137  138  136  K  3.8  4.2  4.2  CL  91*  90*  87*  CO2  33*  33*  36*  BUN  31*  36*  33*  CREATININE  1.30*  1.32*  1.39*  GLUCOSE  300*  274*  260*    Electrolytes Recent Labs     04/04/16  0236  04/05/16  0317  04/06/16  0256  CALCIUM  8.6*  8.9  9.5    Sepsis Markers Recent Labs     04/03/16  1601  04/04/16  0236  04/05/16  0317  PROCALCITON  0.13  0.14  <0.10    ABG Recent Labs     04/03/16  1215  PHART  7.456*  PCO2ART  45.2  PO2ART  60.9*    Liver Enzymes No results for input(s): AST, ALT, ALKPHOS, BILITOT, ALBUMIN in the last 72 hours.  Cardiac Enzymes Recent Labs     04/03/16  1601  04/03/16  2128  04/04/16  0236  TROPONINI  0.03*  <0.03  <0.03   BNP  51.4  04/03/16    Glucose Recent Labs     04/04/16  2120  04/05/16  0812  04/05/16  1403  04/05/16  1701  04/05/16  2117  04/06/16  0849  GLUCAP  287*  242*  320*  308*  316*  263*    Imaging Dg Chest 2 View  Result Date: 04/05/2016 CLINICAL DATA:  Shortness of Breath EXAM: CHEST  2 VIEW COMPARISON:  04/03/2016 FINDINGS: Mild cardiomegaly. No confluent airspace opacities or effusions. No acute bony abnormality. IMPRESSION: Mild cardiomegaly.  No active disease. Electronically Signed   By: Rolm Baptise M.D.   On: 04/05/2016 13:23   Nm Pulmonary Perf And Vent  Result Date: 04/05/2016 CLINICAL DATA:  61 year old female with shortness of breath and hypoxia. EXAM: NUCLEAR MEDICINE VENTILATION - PERFUSION LUNG SCAN TECHNIQUE: Ventilation images were obtained in multiple projections using inhaled aerosol Tc-49mDTPA. Perfusion images were obtained in multiple projections after intravenous injection of Tc-930mAA. RADIOPHARMACEUTICALS:  32.8 mCi Technetium-996mPA aerosol inhalation and 4.38 mCi Technetium-56m30m IV COMPARISON:  None. FINDINGS: Ventilation:  No focal ventilation defect. Perfusion: No wedge shaped peripheral perfusion defects to suggest acute pulmonary embolism. IMPRESSION: Normal.  No evidence of pulmonary emboli. Electronically Signed   By: JeffMargarette Canada.   On: 04/05/2016 13:38       ASSESSMENT / PLAN:  Acute hypoxic respiratory failure  Asthmatic exacerbation in setting of mild chronic asthma with restricive changes due to obesity Influenza A Purulent bronchitis Obesity OSA GERD  Known grade II diastolic dysfxn CKI  Acute hypoxic respiratory failure in setting of Influenza c/b purulent bronchitis and asthmatic exacerbation superimposed on underlying OSA. Suspect evolving pneumonitis and bronchospasm explain her hypoxia. Her plain film is difficult to interpret but it does appear as though she has some early interstitial changes. Ideally a CT of chest would have been helpful but was unable to obtain due to AKI. VQ obtained and low prob for PE.   Plan Continue supplemental O2 as needed to maintain SpO2 > 92%. BIPAP (w/ PRN rests) Cont BDs, systemic steroids Add rocephin and cont doxy D/c heparin. Cont Demadex  Increased  PPI 1/26   to decrease reflux contribution to bronchospasm  Changed nebs to laba/ics bid and prn saba (no atrovent) as this is not copd  Add mucomyst nebs x 2 days as well as flutter valve to help facilitate mucus / sputum clearance   RahuMontey Hora - C [place] [facility] Pager: [phone] ([phone]/2018, 9:13 AM   ATTENDING NOTE / ATTESTATION NOTE :   I have discussed the case with the resident/APP  RahuMontey Hora    I agree with the resident/APP's  history, physical examination, assessment, and plans.    I have edited the above note and modified it according to our agreed history, physical examination, assessment and plan.   Briefly, Obese female f/b, being seen in clinic for mild chronic asthma, obesity with restrictive changes  and sleep  apnea. Also has h/o diastolic dysfxn, CKD and DM. Admitted w/ influenza A, purulent bronchitis and asthmatic exacerbation on 1/25. PCCM asked to see for progressive hypoxia seemingly out of proportion to her CXR changes.   Vitals:  Vitals:   04/06/16 0822 04/06/16 0853 04/06/16 1046 04/06/16 1211  BP:  (!) 141/84    Pulse:  62    Resp:  18    Temp:  98.2 F (36.8 C)  98.9 F (  37.2 C)  TempSrc:  Oral    SpO2: 99% 95% 96%   Weight:      Height:        Constitutional/General:  Pleasant, well-nourished, well-developed, not in any distress,  In mild distress. Off bipap. On [place],   Body mass index is 42.75 kg/m. Wt Readings from Last 3 Encounters:  04/06/16 116.5 kg (256 lb 14.4 oz)  12/11/15 122.3 kg (269 lb 9.6 oz)  11/15/15 120.2 kg (265 lb)    HEENT: Pupils equal and reactive to light and accommodation. Anicteric sclerae. Crowded airway.   Neck: No masses. Midline trachea. No JVD, (-) LAD. (-) bruits appreciated.  Respiratory/Chest: Grossly normal chest. (-) deformity. (-) Accessory muscle use.  Symmetric expansion. (-) Tenderness on palpation.  Resonant on percussion.  Diminished BS on both lower lung zones. (-)crackles, rhonchi. (+) wheezing bilatertally.  (-) egophony  Cardiovascular: Regular rate and  rhythm, heart sounds normal, no murmur or gallops, no peripheral edema  Gastrointestinal:  Normal bowel sounds. Soft, non-tender. No hepatosplenomegaly.  (-) masses.   Musculoskeletal:  Normal muscle tone. Normal gait.   Extremities: Grossly normal. (-) clubbing, cyanosis.  (-) edema  Skin: (-) rash,lesions seen.   Neurological/Psychiatric : alert, oriented to time, place, person. Normal mood and affect    CBC Recent Labs     04/04/16  0236  04/05/16  0317  04/06/16  0256  WBC  12.5*  16.7*  20.8*  HGB  10.8*  11.5*  12.6  HCT  34.2*  36.5  39.2  PLT  415*  451*  489*    Coag's Recent Labs     04/03/16  1601  INR  0.99    BMET Recent Labs      04/04/16  0236  04/05/16  0317  04/06/16  0256  NA  137  138  136  K  3.8  4.2  4.2  CL  91*  90*  87*  CO2  33*  33*  36*  BUN  31*  36*  33*  CREATININE  1.30*  1.32*  1.39*  GLUCOSE  300*  274*  260*    Electrolytes Recent Labs     04/04/16  0236  04/05/16  0317  04/06/16  0256  CALCIUM  8.6*  8.9  9.5    Sepsis Markers Recent Labs     04/03/16  1601  04/04/16  0236  04/05/16  0317  PROCALCITON  0.13  0.14  <0.10    ABG No results for input(s): PHART, PCO2ART, PO2ART in the last 72 hours.  Liver Enzymes No results for input(s): AST, ALT, ALKPHOS, BILITOT, ALBUMIN in the last 72 hours.  Cardiac Enzymes Recent Labs     04/03/16  1601  04/03/16  2128  04/04/16  0236  TROPONINI  0.03*  <0.03  <0.03    Glucose Recent Labs     04/05/16  0812  04/05/16  1403  04/05/16  1701  04/05/16  2117  04/06/16  0849  04/06/16  1211  GLUCAP  242*  320*  308*  316*  263*  294*    Imaging Dg Chest 2 View  Result Date: 04/05/2016 CLINICAL DATA:  Shortness of Breath EXAM: CHEST  2 VIEW COMPARISON:  04/03/2016 FINDINGS: Mild cardiomegaly. No confluent airspace opacities or effusions. No acute bony abnormality. IMPRESSION: Mild cardiomegaly.  No active disease. Electronically Signed   By: Rolm Baptise M.D.   On: 04/05/2016 13:23   Nm Pulmonary Perf And  Vent  Result Date: 04/05/2016 CLINICAL DATA:  61 year old female with shortness of breath and hypoxia. EXAM: NUCLEAR MEDICINE VENTILATION - PERFUSION LUNG SCAN TECHNIQUE: Ventilation images were obtained in multiple projections using inhaled aerosol Tc-48mDTPA. Perfusion images were obtained in multiple projections after intravenous injection of Tc-935mAA. RADIOPHARMACEUTICALS:  32.8 mCi Technetium-9958mPA aerosol inhalation and 4.38 mCi Technetium-73m33m IV COMPARISON:  None. FINDINGS: Ventilation: No focal ventilation defect. Perfusion: No wedge shaped peripheral perfusion defects to suggest acute pulmonary embolism.  IMPRESSION: Normal.  No evidence of pulmonary emboli. Electronically Signed   By: JeffMargarette Canada.   On: 04/05/2016 13:38   Assessment/Plan :  Acute on Chronic Hypoxemic Hypercapneic resp fx 2/2 asthma exacerbation + influenza A + bronchitis + Volume overload/CHFpEF exacerbation + RVD + Obesity + OSA  > clinically improved > pt is being transferred to telemetry or floors >> pt is on cpap 13 cm water at home. Cont cpap 13 cm water +/- o2 to keep o2 sats > 88% while admitted.  > cont Pulmicort and Brovana BID > Cont duoneb > cont abx; deescalate per Primary MD > finish off Tamiful.  > cont IV steroids. Transition to PO steroids.    GERD. > agree with PPI BID while her GERD is flared up.   Cont DVT prophylaxis.    Family : Pt and husband updated at bedside.    J. AMonica Becton 04/06/2016, 1:45 PM [facility] Pager [phone] After 3 pm or if no answer, call [phone]

## 2016-04-06 NOTE — Progress Notes (Signed)
Inpatient Diabetes Program Recommendations  AACE/ADA: New Consensus Statement on Inpatient Glycemic Control (2015)  Target Ranges:  Prepandial:   less than 140 mg/dL      Peak postprandial:   less than 180 mg/dL (1-2 hours)      Critically ill patients:  140 - 180 mg/dL   Results for Kathleen Hatfield, Kathleen Hatfield (MRN [id number]) as of 04/06/2016 10:48  Ref. Range 04/05/2016 08:12 04/05/2016 14:03 04/05/2016 17:01 04/05/2016 21:17 04/06/2016 08:49  Glucose-Capillary Latest Ref Range: 65 - 99 mg/dL 242 (H) 320 (H) 308 (H) 316 (H) 263 (H)   Review of Glycemic Control  Diabetes history: DM 2 Outpatient Diabetes medications: Tresiba 15 units, Metformin 750 Daily, Victoza 1.8 Daily Current orders for Inpatient glycemic control: Lantus 25 units, Novolog Resistant + HS scale  A1c 7.6% on 1/25   IV Solumedrol 80 mg Q12 hours  Inpatient Diabetes Program Recommendations:   Glucose in the 200-300 range. If patient continues current steroid dose, please consider increasing Lantus to 30-35 units (will have to decrease as steroids are tapered). Glucose trend increases with meals as well. Consider Novolog 5 units TID with meals for meal coverage in addition to correction scale.  Thanks,  Tama Headings RN, MSN, [facility] Inpatient Diabetes Coordinator Team Pager [phone] (8a-5p)

## 2016-04-07 LAB — BASIC METABOLIC PANEL
Anion gap: 16 — ABNORMAL HIGH (ref 5–15)
BUN: 35 mg/dL — ABNORMAL HIGH (ref 6–20)
CO2: 37 mmol/L — ABNORMAL HIGH (ref 22–32)
Calcium: 9.7 mg/dL (ref 8.9–10.3)
Chloride: 85 mmol/L — ABNORMAL LOW (ref 101–111)
Creatinine, Ser: 1.29 mg/dL — ABNORMAL HIGH (ref 0.44–1.00)
GFR calc Af Amer: 51 mL/min — ABNORMAL LOW (ref >60)
GFR calc non Af Amer: 44 mL/min — ABNORMAL LOW (ref >60)
Glucose, Bld: 308 mg/dL — ABNORMAL HIGH (ref 65–99)
Potassium: 4.2 mmol/L (ref 3.5–5.1)
Sodium: 138 mmol/L (ref 135–145)

## 2016-04-07 LAB — GLUCOSE, CAPILLARY
Glucose-Capillary: 349 mg/dL — ABNORMAL HIGH (ref 65–99)
Glucose-Capillary: 300 mg/dL — ABNORMAL HIGH (ref 65–99)
Glucose-Capillary: 134 mg/dL — ABNORMAL HIGH (ref 65–99)
Glucose-Capillary: 387 mg/dL — ABNORMAL HIGH (ref 65–99)

## 2016-04-07 LAB — CBC
HCT: 40.3 % (ref 36.0–46.0)
Hemoglobin: 12.8 g/dL (ref 12.0–15.0)
MCH: 24.4 pg — ABNORMAL LOW (ref 26.0–34.0)
MCHC: 31.8 g/dL (ref 30.0–36.0)
MCV: 76.9 fL — ABNORMAL LOW (ref 78.0–100.0)
Platelets: 490 10*3/uL — ABNORMAL HIGH (ref 150–400)
RBC: 5.24 MIL/uL — ABNORMAL HIGH (ref 3.87–5.11)
RDW: 17.3 % — ABNORMAL HIGH (ref 11.5–15.5)
WBC: 19.2 10*3/uL — ABNORMAL HIGH (ref 4.0–10.5)

## 2016-04-07 MED ORDER — METHYLPREDNISOLONE SODIUM SUCC 40 MG IJ SOLR
40.0000 mg | Freq: Two times a day (BID) | INTRAMUSCULAR | Status: DC
  Administered 2016-04-07 – 2016-04-09 (×5): 40 mg via INTRAVENOUS
  Filled 2016-04-07 (×5): qty 1

## 2016-04-07 NOTE — Progress Notes (Addendum)
[facility] PROGRESS NOTE  Kathleen Hatfield CBU:[id number] DOB: March 22, 1955 DOA: 04/02/2016  PCP: Ann Held, MD  Brief History/Interval Summary: 61 year old Caucasian female with a past medical history of obesity, hypertension, small airway disease/asthma, obstructive sleep apnea, presented with complaints of shortness of breath and cough. Patient was recently diagnosed with the influenza and was started on Tamiflu. She was thought to have acute bronchitis. On 1/26 patient started feeling poorly. She had more shortness of breath and was noted to be more hypoxic. She was transferred to stepdown unit. Placed on BiPAP. Critical care medicine was consulted. Patient has improved.  Reason for Visit: Acute bronchitis  Consultants: Pulmonology  Procedures:  Lower extremity venous Doppler is negative for DVT  Transthoracic echocardiogram Study Conclusions  - Left ventricle: The cavity size was normal. Wall thickness was   increased in a pattern of mild LVH. Systolic function was   vigorous. The estimated ejection fraction was in the range of 65%   to 70%. Wall motion was normal; there were no regional wall   motion abnormalities. Doppler parameters are consistent with   abnormal left ventricular relaxation (grade 1 diastolic   dysfunction).  Impressions:  - Vigorous LV systolic function; mild LVH; grade 1 diastolic   dysfunction; elevated LVOT gradient (2.6 m/s) likely related to   hyperdynamic LV function; aortic valve visually appears to open   well.  Antibiotics: Currently on Tamiflu, doxycycline and ceftriaxone  Subjective/Interval History: Patient feels better. Still concerned about her cough, which is for the most part dry. Denies any chest pain.   ROS: Denies any nausea or vomiting  Objective:  Vital Signs  Vitals:   04/07/16 0000 04/07/16 0400 04/07/16 0500 04/07/16 0743  BP: 131/83 121/74  123/60  Pulse: 64 65  69  Resp: 16 15  13   Temp: 98.3 F (36.8 C)  98.1 F (36.7 C)  98.3 F (36.8 C)  TempSrc: Oral Oral  Oral  SpO2: 90% 91%  93%  Weight:   117.2 kg (258 lb 6.1 oz)   Height:        Intake/Output Summary (Last 24 hours) at 04/07/16 0836 Last data filed at 04/07/16 0321  Gross per 24 hour  Intake              413 ml  Output                0 ml  Net              413 ml   Filed Weights   04/05/16 0400 04/06/16 0400 04/07/16 0500  Weight: 117 kg (258 lb) 116.5 kg (256 lb 14.4 oz) 117.2 kg (258 lb 6.1 oz)    General appearance: alert, cooperative, appears stated age and no distress Resp: Improved air entry bilaterally. Few scattered wheezes. No crackles, no rhonchi. Normal effort.  Cardio: regular rate and rhythm, S1, S2 normal, no murmur, click, rub or gallop GI: soft, non-tender; bowel sounds normal; no masses,  no organomegaly Extremities: extremities normal, atraumatic, no cyanosis or edema Neurologic: No focal deficits appreciated  Lab Results:  Data Reviewed: I have personally reviewed following labs and imaging studies  CBC:  Recent Labs Lab 03/31/16 2015 04/02/16 0141 04/03/16 0451 04/04/16 0236 04/05/16 0317 04/06/16 0256 04/07/16 0421  WBC 8.1 13.7* 11.2* 12.5* 16.7* 20.8* 19.2*  NEUTROABS 4.0 9.5*  --   --   --   --   --   HGB 12.0 11.5* 11.1* 10.8* 11.5* 12.6 12.8  HCT 37.7 35.6* 35.1* 34.2* 36.5 39.2 40.3  MCV 78.2 76.2* 76.8* 77.0* 76.7* 77.2* 76.9*  PLT 406* 414* 412* 415* 451* 489* 490*    Basic Metabolic Panel:  Recent Labs Lab 04/03/16 0451 04/04/16 0236 04/05/16 0317 04/06/16 0256 04/07/16 0421  NA 136 137 138 136 138  K 4.1 3.8 4.2 4.2 4.2  CL 94* 91* 90* 87* 85*  CO2 27 33* 33* 36* 37*  GLUCOSE 306* 300* 274* 260* 308*  BUN 28* 31* 36* 33* 35*  CREATININE 1.40* 1.30* 1.32* 1.39* 1.29*  CALCIUM 9.2 8.6* 8.9 9.5 9.7    GFR: Estimated Creatinine Clearance: 58.6 mL/min (by C-G formula based on SCr of 1.29 mg/dL (H)).  Liver Function Tests:  Recent Labs Lab 03/31/16 2015  AST  42*  ALT 50  ALKPHOS 51  BILITOT 0.2*  PROT 7.6  ALBUMIN 3.7    HbA1C: No results for input(s): HGBA1C in the last 72 hours.  CBG:  Recent Labs Lab 04/06/16 0849 04/06/16 1211 04/06/16 1834 04/06/16 2114 04/07/16 0747  GLUCAP 263* 294* 366* 395* 349*     Recent Results (from the past 240 hour(s))  MRSA PCR Screening     Status: Abnormal   Collection Time: 04/02/16  3:51 PM  Result Value Ref Range Status   MRSA by PCR POSITIVE (A) NEGATIVE Final    Comment:        The GeneXpert MRSA Assay (FDA approved for NASAL specimens only), is one component of a comprehensive MRSA colonization surveillance program. It is not intended to diagnose MRSA infection nor to guide or monitor treatment for MRSA infections. RESULT CALLED TO, READ BACK BY AND VERIFIED WITHWilson Singer RN [id number] 04/02/16 A BROWNING       Radiology Studies: Dg Chest 2 View  Result Date: 04/05/2016 CLINICAL DATA:  Shortness of Breath EXAM: CHEST  2 VIEW COMPARISON:  04/03/2016 FINDINGS: Mild cardiomegaly. No confluent airspace opacities or effusions. No acute bony abnormality. IMPRESSION: Mild cardiomegaly.  No active disease. Electronically Signed   By: Rolm Baptise M.D.   On: 04/05/2016 13:23   Nm Pulmonary Perf And Vent  Result Date: 04/05/2016 CLINICAL DATA:  61 year old female with shortness of breath and hypoxia. EXAM: NUCLEAR MEDICINE VENTILATION - PERFUSION LUNG SCAN TECHNIQUE: Ventilation images were obtained in multiple projections using inhaled aerosol Tc-16mDTPA. Perfusion images were obtained in multiple projections after intravenous injection of Tc-931mAA. RADIOPHARMACEUTICALS:  32.8 mCi Technetium-9984mPA aerosol inhalation and 4.38 mCi Technetium-56m37m IV COMPARISON:  None. FINDINGS: Ventilation: No focal ventilation defect. Perfusion: No wedge shaped peripheral perfusion defects to suggest acute pulmonary embolism. IMPRESSION: Normal.  No evidence of pulmonary emboli. Electronically Signed    By: JeffMargarette Canada.   On: 04/05/2016 13:38     Medications:  Scheduled: . acetylcysteine  4 mL Nebulization Q6H  . arformoterol  15 mcg Nebulization BID  . budesonide (PULMICORT) nebulizer solution  0.5 mg Nebulization BID  . cefTRIAXone (ROCEPHIN)  IV  1 g Intravenous Q24H  . doxycycline  100 mg Oral Q12H  . DULoxetine  60 mg Oral BID  . estradiol  2 mg Oral Daily  . fenofibrate  54 mg Oral Daily  . folic acid  1 mg Oral BID  . gabapentin  300 mg Oral QHS  . heparin subcutaneous  5,000 Units Subcutaneous Q8H  . insulin aspart  0-20 Units Subcutaneous TID WC  . insulin aspart  0-5 Units Subcutaneous QHS  . insulin glargine  30  Units Subcutaneous Daily  . mouth rinse  15 mL Mouth Rinse BID  . methylPREDNISolone (SOLU-MEDROL) injection  40 mg Intravenous Q12H  . mupirocin ointment  1 application Nasal BID  . oseltamivir  30 mg Oral BID  . pantoprazole  40 mg Oral BID AC  . potassium chloride  30 mEq Oral Daily  . rOPINIRole  4 mg Oral BID  . rosuvastatin  10 mg Oral Q Sat-1800  . sodium chloride flush  3 mL Intravenous Q12H  . torsemide  60 mg Oral Daily   Continuous:  ZOX:[id number] **OR** acetaminophen, albuterol, aluminum hydroxide-magnesium carbonate, baclofen, benzonatate, guaiFENesin-codeine, LORazepam, ondansetron **OR** ondansetron (ZOFRAN) IV, ondansetron, technetium TC 51M diethylenetriame-pentaacetic acid, traMADol, traZODone  Assessment/Plan:  Principal Problem:   Respiratory distress Active Problems:   Hypertension   Hyperlipidemia   Mild persistent chronic asthma without complication   Chronic diastolic heart failure (HCC)   Morbid obesity (HCC)   Acute bronchitis   Acute renal failure superimposed on stage 2 chronic kidney disease ([place])   Acute hypoxemic respiratory failure (HCC)   Renal insufficiency   Influenza A    Acute Hypoxic respiratory failure likely related to bronchitis in the setting of small airway disease and influenza. Etiology for  her decompensation and hypoxia not entirely clear. Symptoms could be due to small airway disease. BNP was normal. No evidence for pulmonary edema on examination or chest x-ray. Echocardiogram shows normal systolic function.VQ scan did not reveal any evidence for pulmonary embolism. IV heparin which was initiated empirically was discontinued. Lower extremity Dopplers were also negative for DVT. Pulmonology is following. Start cutting back on steroids. Continue antibiotics. Continue Tamiflu.   Acute bronchitis in the setting of small airway disease/persistent chronic asthma, obstructive sleep apnea.  Continue ceftriaxone and doxycycline. She is also on Tamiflu. antibiotics. See above. Has not required BiPAP in the last many days.  Acute renal failure superimposed on stage II chronic kidney disease.  Creatinine is stable. Continue home medications. No need for any IV fluids currently. She remains on torsemide.  Chronic diastolic heart failure Appears compensated. Echocardiogram report as above. Echo 3 years ago reveals an EF of 65% and grade 2 diastolic dysfunction. Daily weights. Monitor intake and output. She was given a dose of Lasix 1/26. BNP was however normal. She remains on torsemide.  Diabetes mellitus type II on oral agents HbA1c 7.6. Patient was started on Lantus and dose was increased on 1/29. CBGs remain elevated. However, dose of Solu-Medrol is being reduced today, so we should see some improvement. Continue sliding scale coverage. Continue to monitor CBGs.   Essential Hypertension Monitor blood pressures closely.  DVT Prophylaxis: Lovenox    Code Status: Full code  Family Communication: Discussed with the patient  Disposition Plan: Patient is improving slowly. Okay for transfer to [facility].     LOS: 4 days   [facility] Pager [phone] 04/07/2016, 8:36 AM  If 7PM-7AM, please contact night-coverage at [email or web address], password [facility]

## 2016-04-07 NOTE — Progress Notes (Signed)
Pt received from 4E to 6E10. Pt is A7 O x 4 and has husband at bediside. CPAP machine at bedside for pt to use when sleeping.

## 2016-04-07 NOTE — Progress Notes (Signed)
Transferred patient to 6e10, patient husband at bedside and was provided belongings.

## 2016-04-07 NOTE — Progress Notes (Addendum)
Name: Kathleen Hatfield MRN: [id number] DOB: 1955/12/24    ADMISSION DATE:  04/02/2016 CONSULTATION DATE:  1/26  REFERRING MD :  [name]   CHIEF COMPLAINT:  Acute hypoxic respiratory failure   BRIEF PATIENT DESCRIPTION:  Obese female f/b AD in our clinic for mild chronic asthma, obesity with restrictive changes  and sleep apnea. Also has h/o diastolic dysfxn, CKD and DM. Admitted w/ influenza A, purulent bronchitis and asthmatic exacerbation on 1/25. PCCM asked to see for progressive hypoxia seemingly out of proportion to her CXR changes.   SIGNIFICANT EVENTS  1/25 admitted 1/26 transferred to SDU for worsening hypoxia  STUDIES:  ECHO 4/09> GI diastolic dysfunction  LE [place] 1/26> neg  VQ 1/26>>> normal, no evidence of PE PCT 1/26 = .14  And < .10 on 1/28   ABX Rocephin 1/26>>> Doxy 1/26>>>    SUBJECTIVE:  Congestion somewhat improved but still feels needs to cough more up. -7L.   VITAL SIGNS: Temp:  [98.1 F (36.7 C)-98.9 F (37.2 C)] 98.3 F (36.8 C) (01/30 0743) Pulse Rate:  [62-87] 69 (01/30 0743) Resp:  [13-18] 13 (01/30 0743) BP: (121-141)/(60-84) 123/60 (01/30 0743) SpO2:  [90 %-99 %] 93 % (01/30 0743) Weight:  [117.2 kg (258 lb 6.1 oz)] 117.2 kg (258 lb 6.1 oz) (01/30 0500)  PHYSICAL EXAMINATION: General appearance:  obese wf, in no distress, watching TV Eyes:  moist conjunctivae   Mouth:  no mucosal ulcerations  Neck: Trachea midline; neck supple, no JVD Lungs/chest:  Normal respiratory effort, faint expiratory wheeze RUL CV: RRR, no MRGs  Abdomen: Soft, non-tender; no masses or HSM Extremities: No peripheral edema or extremity lymphadenopathy Skin: no breakdown/ warm and dry/ no rash  Neuro/Psych: Alert, nl sensorium   CBC Recent Labs     04/05/16  0317  04/06/16  0256  04/07/16  0421  WBC  16.7*  20.8*  19.2*  HGB  11.5*  12.6  12.8  HCT  36.5  39.2  40.3  PLT  451*  489*  490*    Coag's No results for input(s): APTT, INR in the last 72  hours.  BMET Recent Labs     04/05/16  0317  04/06/16  0256  04/07/16  0421  NA  138  136  138  K  4.2  4.2  4.2  CL  90*  87*  85*  CO2  33*  36*  37*  BUN  36*  33*  35*  CREATININE  1.32*  1.39*  1.29*  GLUCOSE  274*  260*  308*    Electrolytes Recent Labs     04/05/16  0317  04/06/16  0256  04/07/16  0421  CALCIUM  8.9  9.5  9.7    Sepsis Markers Recent Labs     04/05/16  0317  PROCALCITON  <0.10    ABG No results for input(s): PHART, PCO2ART, PO2ART in the last 72 hours.  Liver Enzymes No results for input(s): AST, ALT, ALKPHOS, BILITOT, ALBUMIN in the last 72 hours.  Cardiac Enzymes No results for input(s): TROPONINI, PROBNP in the last 72 hours. BNP  51.4  04/03/16    Glucose Recent Labs     04/05/16  2117  04/06/16  0849  04/06/16  1211  04/06/16  1834  04/06/16  2114  04/07/16  0747  GLUCAP  316*  263*  294*  366*  395*  349*    Imaging Dg Chest 2 View  Result Date: 04/05/2016 CLINICAL  DATA:  Shortness of Breath EXAM: CHEST  2 VIEW COMPARISON:  04/03/2016 FINDINGS: Mild cardiomegaly. No confluent airspace opacities or effusions. No acute bony abnormality. IMPRESSION: Mild cardiomegaly.  No active disease. Electronically Signed   By: Rolm Baptise M.D.   On: 04/05/2016 13:23   Nm Pulmonary Perf And Vent  Result Date: 04/05/2016 CLINICAL DATA:  61 year old female with shortness of breath and hypoxia. EXAM: NUCLEAR MEDICINE VENTILATION - PERFUSION LUNG SCAN TECHNIQUE: Ventilation images were obtained in multiple projections using inhaled aerosol Tc-54mDTPA. Perfusion images were obtained in multiple projections after intravenous injection of Tc-955mAA. RADIOPHARMACEUTICALS:  32.8 mCi Technetium-9939mPA aerosol inhalation and 4.38 mCi Technetium-85m2m IV COMPARISON:  None. FINDINGS: Ventilation: No focal ventilation defect. Perfusion: No wedge shaped peripheral perfusion defects to suggest acute pulmonary embolism. IMPRESSION: Normal.  No  evidence of pulmonary emboli. Electronically Signed   By: JeffMargarette Canada.   On: 04/05/2016 13:38       ASSESSMENT / PLAN:  Acute hypoxic respiratory failure  Asthmatic exacerbation in setting of mild chronic asthma with restricive changes due to obesity Influenza A Purulent bronchitis Obesity OSA GERD  Known grade II diastolic dysfxn CKI  Acute hypoxic respiratory failure in setting of Influenza c/b purulent bronchitis and asthmatic exacerbation superimposed on underlying OSA. Suspect evolving pneumonitis and bronchospasm explain her hypoxia. Her plain film is difficult to interpret but it does appear as though she has some early interstitial changes. Ideally a CT of chest would have been helpful but was unable to obtain due to AKI. VQ obtained and low prob for PE. Suspect that her hypoxemia is direct result of flu / viral illness and that this will take prolonged time to resolve.   Plan Continue supplemental O2 as needed to maintain SpO2 > 92% - suspect this will take prolonged time to resolve. BIPAP (w/ PRN rests) Cont BDs, systemic steroids (start to wean), abx (would de-escalate as suspect this is more viral) Cont Demadex  Increased  PPI 1/26   to decrease reflux contribution to bronchospasm  Changed nebs to laba/ics bid and prn saba (no atrovent) as this is not copd  Continue mucomyst nebs as well as flutter valve to help facilitate mucus / sputum clearance   RahuMontey Hora - [facility] Pager: [phone] ([phone]/2018, 7:54 AM    ATTENDING NOTE / ATTESTATION NOTE :   I have discussed the case with the resident/APP  RahuMontey Hora  I agree with the resident/APP's  history, physical examination, assessment, and plans.    I have edited the above note and modified it according to our agreed history, physical examination, assessment and plan.   Briefly, Obese female f/b, being seen in clinic for mild chronic  asthma, obesity with restrictive changes  and sleep apnea. Also has h/o diastolic dysfxn, CKD and DM. Admitted w/ influenza A, purulent bronchitis and asthmatic exacerbation on 1/25. PCCM asked to see for progressive hypoxia seemingly out of proportion to her CXR changes.    Last 24 hrs, slow improvement but she is better. Used cpap last night.   Vitals:  Vitals:   04/07/16 0903 04/07/16 1100 04/07/16 1200 04/07/16 1426  BP:   (!) 141/67 122/72  Pulse:   78 76  Resp:   14 14  Temp:   98.5 F (36.9 C) 97.8 F (36.6 C)  TempSrc:  Oral Oral Oral  SpO2: 97%  92% 93%  Weight:      Height:        Constitutional/General: well-nourished, well-developed,  not in any distress  Body mass index is 43 kg/m. Wt Readings from Last 3 Encounters:  04/07/16 117.2 kg (258 lb 6.1 oz)  12/11/15 122.3 kg (269 lb 9.6 oz)  11/15/15 120.2 kg (265 lb)    HEENT: PERLA, anicteric sclerae. (-) Oral thrush.   Neck: No masses. Midline trachea. No JVD, (-) LAD. (-) bruits appreciated.  Respiratory/Chest: Grossly normal chest. (-) deformity. (-) Accessory muscle use.  Symmetric expansion. Diminished BS on both lower lung zones. (-) crackles, rhonchi. Less wheezing today.  (-) egophony  Cardiovascular: Regular rate and  rhythm, heart sounds normal, no murmur or gallops,  Trace peripheral edema  Gastrointestinal:  Normal bowel sounds. Soft, non-tender. No hepatosplenomegaly.  (-) masses.   Musculoskeletal:  Normal muscle tone.   Extremities: Grossly normal. (-) clubbing, cyanosis.  (-) edema  Skin: (-) rash,lesions seen.   Neurological/Psychiatric : sedated, intubated. CN grossly intact. (-) lateralizing signs.    CBC Recent Labs     04/05/16  0317  04/06/16  0256  04/07/16  0421  WBC  16.7*  20.8*  19.2*  HGB  11.5*  12.6  12.8  HCT  36.5  39.2  40.3  PLT  451*  489*  490*    Coag's No results for input(s): APTT, INR in the last 72 hours.  BMET Recent Labs     04/05/16  0317   04/06/16  0256  04/07/16  0421  NA  138  136  138  K  4.2  4.2  4.2  CL  90*  87*  85*  CO2  33*  36*  37*  BUN  36*  33*  35*  CREATININE  1.32*  1.39*  1.29*  GLUCOSE  274*  260*  308*    Electrolytes Recent Labs     04/05/16  0317  04/06/16  0256  04/07/16  0421  CALCIUM  8.9  9.5  9.7    Sepsis Markers Recent Labs     04/05/16  0317  PROCALCITON  <0.10    ABG No results for input(s): PHART, PCO2ART, PO2ART in the last 72 hours.  Liver Enzymes No results for input(s): AST, ALT, ALKPHOS, BILITOT, ALBUMIN in the last 72 hours.  Cardiac Enzymes No results for input(s): TROPONINI, PROBNP in the last 72 hours.  Glucose Recent Labs     04/06/16  1211  04/06/16  1834  04/06/16  2114  04/07/16  0747  04/07/16  1211  04/07/16  1651  GLUCAP  294*  366*  395*  349*  300*  134*    Imaging No results found.  Assessment/Plan :  Acute on Chronic Hypoxemic Hypercapneic resp fx 2/2 asthma exacerbation + influenza A + bronchitis + Volume overload/CHFpEF exacerbation + RVD + Obesity + OSA  > clinically improved last 1-2 days.  > cont Pulmicort and Brovana BID > Cont duoneb > cont abx; deescalate per Primary MD > finish off Tamiful.  > cont IV steroids. Transition to PO steroids in am. Taper off in 1- 2 weeks  if possible.  > cont cpap 13 cm at HS for OSA.   GERD. > cont PPI BID while her GERD is flared up.   Cont DVT prophylaxis.   Anticipate she will be discharged in 1-2 days.  PCCM will follow pt peripherally.  pls call back if with worsening resp status or issues.  Family :Family updated at length today.  Plan d/w pt and husband.    Monica Becton, MD 04/07/2016, 6:06 PM [facility] Pager [phone] After 3 pm or if no answer, call [phone]

## 2016-04-08 DIAGNOSIS — N179 Acute kidney failure, unspecified: Secondary | ICD-10-CM

## 2016-04-08 DIAGNOSIS — E784 Other hyperlipidemia: Secondary | ICD-10-CM

## 2016-04-08 DIAGNOSIS — N182 Chronic kidney disease, stage 2 (mild): Secondary | ICD-10-CM

## 2016-04-08 LAB — BASIC METABOLIC PANEL
Anion gap: 12 (ref 5–15)
BUN: 31 mg/dL — ABNORMAL HIGH (ref 6–20)
CO2: 34 mmol/L — ABNORMAL HIGH (ref 22–32)
Calcium: 9.3 mg/dL (ref 8.9–10.3)
Chloride: 89 mmol/L — ABNORMAL LOW (ref 101–111)
Creatinine, Ser: 1.25 mg/dL — ABNORMAL HIGH (ref 0.44–1.00)
GFR calc Af Amer: 53 mL/min — ABNORMAL LOW (ref >60)
GFR calc non Af Amer: 45 mL/min — ABNORMAL LOW (ref >60)
Glucose, Bld: 297 mg/dL — ABNORMAL HIGH (ref 65–99)
Potassium: 4 mmol/L (ref 3.5–5.1)
Sodium: 135 mmol/L (ref 135–145)

## 2016-04-08 LAB — CBC
HCT: 41.6 % (ref 36.0–46.0)
Hemoglobin: 13.1 g/dL (ref 12.0–15.0)
MCH: 24.3 pg — ABNORMAL LOW (ref 26.0–34.0)
MCHC: 31.5 g/dL (ref 30.0–36.0)
MCV: 77.3 fL — ABNORMAL LOW (ref 78.0–100.0)
Platelets: 454 10*3/uL — ABNORMAL HIGH (ref 150–400)
RBC: 5.38 MIL/uL — ABNORMAL HIGH (ref 3.87–5.11)
RDW: 17.9 % — ABNORMAL HIGH (ref 11.5–15.5)
WBC: 20.5 10*3/uL — ABNORMAL HIGH (ref 4.0–10.5)

## 2016-04-08 LAB — GLUCOSE, CAPILLARY
Glucose-Capillary: 277 mg/dL — ABNORMAL HIGH (ref 65–99)
Glucose-Capillary: 311 mg/dL — ABNORMAL HIGH (ref 65–99)
Glucose-Capillary: 381 mg/dL — ABNORMAL HIGH (ref 65–99)
Glucose-Capillary: 259 mg/dL — ABNORMAL HIGH (ref 65–99)

## 2016-04-08 MED ORDER — FUROSEMIDE 10 MG/ML IJ SOLN
40.0000 mg | Freq: Once | INTRAMUSCULAR | Status: AC
  Administered 2016-04-08: 40 mg via INTRAVENOUS
  Filled 2016-04-08: qty 4

## 2016-04-08 NOTE — Progress Notes (Signed)
Physical Therapy Treatment Patient Details Name: Kathleen Hatfield MRN: [id number] DOB: 11-25-1955 Today's Date: 04/08/2016    History of Present Illness Pt is a 61 y/o female admitted from home secondary to dyspnea. PMH including but not limited to obesity with small airway disease, HTN, DM, CHF, CKD, and ulcerative colitis.     PT Comments    Pt minimally participative, but agreed with need to get up and mobilize.   Follow Up Recommendations  No PT follow up     Equipment Recommendations  None recommended by PT    Recommendations for Other Services       Precautions / Restrictions Precautions Precautions: None    Mobility  Bed Mobility               General bed mobility comments: in chair on arrival  Transfers Overall transfer level: Modified independent Equipment used: None Transfers: Sit to/from Stand Sit to Stand: Modified independent (Device/Increase time)            Ambulation/Gait Ambulation/Gait assistance: Supervision Ambulation Distance (Feet): 200 Feet Assistive device: None Gait Pattern/deviations: Step-through pattern Gait velocity: decr Gait velocity interpretation: at or above normal speed for age/gender General Gait Details: generally steady, was able to scan and take abrupt turns, but was not enthusiastic to continue.   Stairs            Wheelchair Mobility    Modified Rankin (Stroke Patients Only)       Balance Overall balance assessment: Needs assistance   Sitting balance-Leahy Scale: Good     Standing balance support: During functional activity;No upper extremity supported Standing balance-Leahy Scale: Good                      Cognition Arousal/Alertness: Awake/alert Behavior During Therapy: WFL for tasks assessed/performed Overall Cognitive Status: Within Functional Limits for tasks assessed                      Exercises Other Exercises Other Exercises: education on EC techniques for bathing/  dressing. pt has family drop her off at the door when out in public and uses carts once inside store. Pt also orders online to prevent need to shop to save energy    General Comments        Pertinent Vitals/Pain Pain Assessment: No/denies pain    Home Living                      Prior Function            PT Goals (current goals can now be found in the care plan section) Acute Rehab PT Goals Patient Stated Goal: to see snow. i like snow PT Goal Formulation: With patient Time For Goal Achievement: 04/18/16 Potential to Achieve Goals: Good Progress towards PT goals: Progressing toward goals    Frequency    Min 3X/week      PT Plan Current plan remains appropriate    Co-evaluation PT/OT/SLP Co-Evaluation/Treatment: Yes Reason for Co-Treatment: Other (comment) (Other (comment);Necessary to address cognition/behavior duri) PT goals addressed during session: Mobility/safety with mobility OT goals addressed during session: ADL's and self-care;Strengthening/ROM     End of Session   Activity Tolerance: Patient tolerated treatment well Patient left: in chair;with call bell/phone within reach;with family/visitor present     Time: 1335-1400 PT Time Calculation (min) (ACUTE ONLY): 25 min  Charges:  $Gait Training: 8-22 mins  G CodesTessie Fass Theotis Gerdeman 04/08/2016, 5:27 PM 04/08/2016  Donnella Sham, [place] [phone]  (pager)

## 2016-04-08 NOTE — Progress Notes (Signed)
[facility] PROGRESS NOTE  CAMARA RENSTROM XIP:[id number] DOB: 23-Jan-1956 DOA: 04/02/2016  PCP: Ann Held, MD   Subjective/Interval History: Feels better, still has some cough with minimal sputum production. Given extra dose of Lasix. Needs evaluation of oxygen need.  Brief History/Interval Summary: 61 year old Caucasian female with a past medical history of obesity, hypertension, small airway disease/asthma, obstructive sleep apnea, presented with complaints of shortness of breath and cough. Patient was recently diagnosed with the influenza and was started on Tamiflu. She was thought to have acute bronchitis. On 1/26 patient started feeling poorly. She had more shortness of breath and was noted to be more hypoxic. She was transferred to stepdown unit. Placed on BiPAP. Critical care medicine was consulted. Patient has improved.  Reason for Visit: Acute bronchitis  Consultants: Pulmonology  Procedures:  Lower extremity venous Doppler is negative for DVT  Transthoracic echocardiogram Study Conclusions  - Left ventricle: The cavity size was normal. Wall thickness was   increased in a pattern of mild LVH. Systolic function was   vigorous. The estimated ejection fraction was in the range of 65%   to 70%. Wall motion was normal; there were no regional wall   motion abnormalities. Doppler parameters are consistent with   abnormal left ventricular relaxation (grade 1 diastolic   dysfunction).  Impressions:  - Vigorous LV systolic function; mild LVH; grade 1 diastolic   dysfunction; elevated LVOT gradient (2.6 m/s) likely related to   hyperdynamic LV function; aortic valve visually appears to open   well.  Antibiotics: Currently on Tamiflu, doxycycline and ceftriaxone   ROS: Denies any nausea or vomiting  Objective:  Vital Signs  Vitals:   04/07/16 2048 04/07/16 2110 04/08/16 0424 04/08/16 0929  BP: 133/69  115/61 129/63  Pulse: 77 76 66 83  Resp: 18 18 17 18     Temp:   98.3 F (36.8 C) 98.4 F (36.9 C)  TempSrc:    Oral  SpO2: 90%  91% 93%  Weight:   103 kg (227 lb 1.2 oz)   Height:        Intake/Output Summary (Last 24 hours) at 04/08/16 1302 Last data filed at 04/08/16 1102  Gross per 24 hour  Intake              290 ml  Output             1400 ml  Net            -1110 ml   Filed Weights   04/06/16 0400 04/07/16 0500 04/08/16 0424  Weight: 116.5 kg (256 lb 14.4 oz) 117.2 kg (258 lb 6.1 oz) 103 kg (227 lb 1.2 oz)    General appearance: alert, cooperative, appears stated age and no distress Resp: Improved air entry bilaterally. Few scattered wheezes. No crackles, no rhonchi. Normal effort.  Cardio: regular rate and rhythm, S1, S2 normal, no murmur, click, rub or gallop GI: soft, non-tender; bowel sounds normal; no masses,  no organomegaly Extremities: extremities normal, atraumatic, no cyanosis or edema Neurologic: No focal deficits appreciated  Lab Results:  Data Reviewed: I have personally reviewed following labs and imaging studies  CBC:  Recent Labs Lab 04/02/16 0141  04/04/16 0236 04/05/16 0317 04/06/16 0256 04/07/16 0421 04/08/16 0629  WBC 13.7*  < > 12.5* 16.7* 20.8* 19.2* 20.5*  NEUTROABS 9.5*  --   --   --   --   --   --   HGB 11.5*  < > 10.8* 11.5* 12.6 12.8  13.1  HCT 35.6*  < > 34.2* 36.5 39.2 40.3 41.6  MCV 76.2*  < > 77.0* 76.7* 77.2* 76.9* 77.3*  PLT 414*  < > 415* 451* 489* 490* 454*  < > = values in this interval not displayed.  Basic Metabolic Panel:  Recent Labs Lab 04/04/16 0236 04/05/16 0317 04/06/16 0256 04/07/16 0421 04/08/16 0629  NA 137 138 136 138 135  K 3.8 4.2 4.2 4.2 4.0  CL 91* 90* 87* 85* 89*  CO2 33* 33* 36* 37* 34*  GLUCOSE 300* 274* 260* 308* 297*  BUN 31* 36* 33* 35* 31*  CREATININE 1.30* 1.32* 1.39* 1.29* 1.25*  CALCIUM 8.6* 8.9 9.5 9.7 9.3    GFR: Estimated Creatinine Clearance: 56.3 mL/min (by C-G formula based on SCr of 1.25 mg/dL (H)).  Liver Function Tests: No  results for input(s): AST, ALT, ALKPHOS, BILITOT, PROT, ALBUMIN in the last 168 hours.  HbA1C: No results for input(s): HGBA1C in the last 72 hours.  CBG:  Recent Labs Lab 04/07/16 1211 04/07/16 1651 04/07/16 2133 04/08/16 0820 04/08/16 1139  GLUCAP 300* 134* 387* 277* 311*     Recent Results (from the past 240 hour(s))  MRSA PCR Screening     Status: Abnormal   Collection Time: 04/02/16  3:51 PM  Result Value Ref Range Status   MRSA by PCR POSITIVE (A) NEGATIVE Final    Comment:        The GeneXpert MRSA Assay (FDA approved for NASAL specimens only), is one component of a comprehensive MRSA colonization surveillance program. It is not intended to diagnose MRSA infection nor to guide or monitor treatment for MRSA infections. RESULT CALLED TO, READ BACK BY AND VERIFIED WITHWilson Singer RN [id number] 04/02/16 A BROWNING       Radiology Studies: No results found.   Medications:  Scheduled: . arformoterol  15 mcg Nebulization BID  . budesonide (PULMICORT) nebulizer solution  0.5 mg Nebulization BID  . cefTRIAXone (ROCEPHIN)  IV  1 g Intravenous Q24H  . doxycycline  100 mg Oral Q12H  . DULoxetine  60 mg Oral BID  . estradiol  2 mg Oral Daily  . fenofibrate  54 mg Oral Daily  . folic acid  1 mg Oral BID  . gabapentin  300 mg Oral QHS  . heparin subcutaneous  5,000 Units Subcutaneous Q8H  . insulin aspart  0-20 Units Subcutaneous TID WC  . insulin aspart  0-5 Units Subcutaneous QHS  . insulin glargine  30 Units Subcutaneous Daily  . mouth rinse  15 mL Mouth Rinse BID  . methylPREDNISolone (SOLU-MEDROL) injection  40 mg Intravenous Q12H  . oseltamivir  30 mg Oral BID  . pantoprazole  40 mg Oral BID AC  . potassium chloride  30 mEq Oral Daily  . rOPINIRole  4 mg Oral BID  . rosuvastatin  10 mg Oral Q Sat-1800  . sodium chloride flush  3 mL Intravenous Q12H  . torsemide  60 mg Oral Daily   Continuous:  MHD:[id number] **OR** acetaminophen, albuterol, aluminum  hydroxide-magnesium carbonate, baclofen, benzonatate, guaiFENesin-codeine, LORazepam, ondansetron **OR** ondansetron (ZOFRAN) IV, ondansetron, technetium TC 28M diethylenetriame-pentaacetic acid, traMADol, traZODone  Assessment/Plan:  Principal Problem:   Respiratory distress Active Problems:   Hypertension   Hyperlipidemia   Mild persistent chronic asthma without complication   Chronic diastolic heart failure (HCC)   Morbid obesity (HCC)   Acute bronchitis   Acute renal failure superimposed on stage 2 chronic kidney disease ([place])   Acute hypoxemic respiratory  failure (HCC)   Renal insufficiency   Influenza A    Acute Hypoxic respiratory failure likely related to bronchitis in the setting of small airway disease and influenza. Etiology for her decompensation and hypoxia not entirely clear. Symptoms could be due to small airway disease. BNP was normal. No evidence for pulmonary edema on examination or chest x-ray. Echocardiogram shows normal systolic function.VQ scan did not reveal any evidence for pulmonary embolism. IV heparin which was initiated empirically was discontinued. Lower extremity Dopplers were also negative for DVT. Pulmonology is following. Start cutting back on steroids. Continue antibiotics. Continue Tamiflu.  Needs evaluation for oxygen needs at home. Current respiratory regimen. Likely to discharge in a.m.  Acute bronchitis in the setting of small airway disease/persistent chronic asthma, obstructive sleep apnea.  Continue ceftriaxone and doxycycline. She is also on Tamiflu. antibiotics. See above. Has not required BiPAP in the last many days.  Acute renal failure superimposed on stage II chronic kidney disease.  Creatinine is stable. Continue home medications. No need for any IV fluids currently. She remains on torsemide.  Chronic diastolic heart failure Appears compensated. Echocardiogram report as above. Echo 3 years ago reveals an EF of 65% and grade 2  diastolic dysfunction. Daily weights. Monitor intake and output. She was given a dose of Lasix 1/26. BNP was however normal. She remains on torsemide.  Diabetes mellitus type II on oral agents HbA1c 7.6. Patient was started on Lantus and dose was increased on 1/29. CBGs remain elevated. However, dose of Solu-Medrol is being reduced today, so we should see some improvement. Continue sliding scale coverage. Continue to monitor CBGs.   Essential Hypertension Monitor blood pressures closely.  DVT Prophylaxis: Lovenox    Code Status: Full code  Family Communication: Discussed with the patient  Disposition Plan: Patient is improving slowly. Okay for transfer to [place].     LOS: 5 days   [facility] A  [facility] Pager [phone] 04/08/2016, 1:02 PM  If 7PM-7AM, please contact night-coverage at [email or web address], password [facility]

## 2016-04-08 NOTE — Progress Notes (Signed)
Pts glucose this evening was 381. Pt found eating italian ice and drinking full strength sodas. Nurse educated pt on the importance of not consuming products with so much sugar in them as the diabetes coordinator and the physician are working hard to stabilize her glucose and find her true baseline. I had to cover the pt this evening with 20 units of Novolog due to her noncompliance. Pt educated and will continue to monitor.

## 2016-04-08 NOTE — Progress Notes (Signed)
Occupational Therapy Treatment Patient Details Name: Kathleen Hatfield MRN: [id number] DOB: 10-11-1955 Today's Date: 04/08/2016    History of present illness Pt is a 61 y/o female admitted from home secondary to dyspnea. PMH including but not limited to obesity with small airway disease, HTN, DM, CHF, CKD, and ulcerative colitis.    OT comments  Pt at adequate level for d/c from OT standpoint. Pt does demonstrate decr activity tolerance but able to complete basic adls. Pt plans to d/c to friends house with 24/7 (A) . Pt eager to d/c home.    Follow Up Recommendations  No OT follow up    Equipment Recommendations  None recommended by OT    Recommendations for Other Services      Precautions / Restrictions Precautions Precautions: None       Mobility Bed Mobility               General bed mobility comments: in chair on arrival  Transfers Overall transfer level: Modified independent                    Balance                                   ADL Overall ADL's : Modified independent Eating/Feeding: Modified independent   Grooming: Wash/dry hands;Modified independent               Lower Body Dressing: Modified independent Lower Body Dressing Details (indicate cue type and reason): don pants as therapist arriving Toilet Transfer: Modified Independent Toilet Transfer Details (indicate cue type and reason): simulated sit<>stand from chair from standard height and at home will have comfort height         Functional mobility during ADLs: Supervision/safety General ADL Comments: Pt moving in the [place] with x3 rest breaks and oxygen saturation 90% on RA      Vision                     Perception     Praxis      Cognition   Behavior During Therapy: [facility] for tasks assessed/performed Overall Cognitive Status: Within Functional Limits for tasks assessed                       Extremity/Trunk Assessment               Exercises Other Exercises Other Exercises: education on EC techniques for bathing/ dressing. pt has family drop her off at the door when out in public and uses carts once inside store. Pt also orders online to prevent need to shop to save energy   Shoulder Instructions       General Comments      Pertinent Vitals/ Pain       Pain Assessment: No/denies pain  Home Living                                          Prior Functioning/Environment              Frequency  Min 2X/week        Progress Toward Goals  OT Goals(current goals can now be found in the care plan section)  Progress towards OT goals: Goals met/education completed, patient discharged from OT  Acute Rehab  OT Goals Patient Stated Goal: to see snow. i like snow OT Goal Formulation: With patient Time For Goal Achievement: 04/13/16 Potential to Achieve Goals: Good ADL Goals Pt Will Perform Grooming: with supervision;standing Pt Will Perform Lower Body Bathing: with supervision;sit to/from stand Pt Will Perform Lower Body Dressing: with supervision;sit to/from stand Pt Will Transfer to Toilet: with supervision;ambulating;regular height toilet Pt Will Perform Toileting - Clothing Manipulation and hygiene: with supervision;sit to/from stand Pt Will Perform Tub/Shower Transfer: Tub transfer;with supervision;ambulating;shower seat Additional ADL Goal #1: Pt will generalize energy conservation strategies and breathing techniques in ADL and mobility independently.  Plan Discharge plan remains appropriate    Co-evaluation    PT/OT/SLP Co-Evaluation/Treatment: Yes Reason for Co-Treatment: Other (comment);Necessary to address cognition/behavior during functional activity (work to get patient to participate)   OT goals addressed during session: ADL's and self-care;Strengthening/ROM      End of Session     Activity Tolerance Patient tolerated treatment well   Patient Left Other (comment) (PT  KEn sitting EOB)   Nurse Communication Mobility status;Precautions        Time: 8185-6314 OT Time Calculation (min): 23 min  Charges: OT General Charges $OT Visit: 1 Procedure OT Treatments $Therapeutic Activity: 8-22 mins  Parke Poisson B 04/08/2016, 2:06 PM   Jeri Modena   OTR/L Pager: [phone] Office: [phone] .

## 2016-04-08 NOTE — Progress Notes (Signed)
Results for Kathleen Hatfield, Kathleen Hatfield (MRN [id number]) as of 04/08/2016 12:16  Ref. Range 04/07/2016 12:11 04/07/2016 16:51 04/07/2016 21:33 04/08/2016 08:20 04/08/2016 11:39  Glucose-Capillary Latest Ref Range: 65 - 99 mg/dL 300 (H) 134 (H) 387 (H) 277 (H) 311 (H)  Noted that postprandial blood sugars continue to be elevated. Recommend adding Novolog 4-5 units TID  if patient eats at least 50% of meals. Will continue to monitor blood sugars while in the hospital. Harvel Ricks RN BSN CDE

## 2016-04-08 NOTE — Care Management Note (Addendum)
Case Management Note  Patient Details  Name: Kathleen Hatfield MRN: [id number] Date of Birth: 09/07/1955  Subjective/Objective:                 Patient admitted from home with resp distress, lives with son. Hx COPD. Continues IV Abx and steroids. Patient states she has functional CPAP and Nebulizer through [facility], she states she does not have home O2, on RA at time of assessment. She states she ambulates independently at home and does not need further DME. She states she can afford her meds through a program called "Extra Help" offered by medicare. She gets assistance with transportation to pharmacy and MD office through her family or friends. PCP Dr. Ann Held Puml: Dr Melvyn Novas   Action/Plan:  CM will continue to follow. Addendum- Patient to DC to [place] [id number]. Referral made to [facility] clinical liaison Karolee Stamps for [facility]. Patient and state hey would like to use [facility] as they have used them in the past.   Expected Discharge Date:   (unsure)               Expected Discharge Plan:  [place]  In-House Referral:  NA  Discharge planning Services  CM Consult  Post Acute Care Choice:    Choice offered to:     DME Arranged:    DME Agency:     HH Arranged:    [place] Agency:     Status of Service:  In process, will continue to follow  If discussed at Long Length of Stay Meetings, dates discussed:    Additional Comments:  Carles Collet, RN 04/08/2016, 11:13 AM

## 2016-04-09 LAB — CBC
HCT: 46.5 % — ABNORMAL HIGH (ref 36.0–46.0)
Hemoglobin: 14.7 g/dL (ref 12.0–15.0)
MCH: 24.6 pg — ABNORMAL LOW (ref 26.0–34.0)
MCHC: 31.6 g/dL (ref 30.0–36.0)
MCV: 77.8 fL — ABNORMAL LOW (ref 78.0–100.0)
Platelets: 519 10*3/uL — ABNORMAL HIGH (ref 150–400)
RBC: 5.98 MIL/uL — ABNORMAL HIGH (ref 3.87–5.11)
RDW: 18.2 % — ABNORMAL HIGH (ref 11.5–15.5)
WBC: 22.5 10*3/uL — ABNORMAL HIGH (ref 4.0–10.5)

## 2016-04-09 LAB — GLUCOSE, CAPILLARY
Glucose-Capillary: 324 mg/dL — ABNORMAL HIGH (ref 65–99)
Glucose-Capillary: 320 mg/dL — ABNORMAL HIGH (ref 65–99)

## 2016-04-09 LAB — BASIC METABOLIC PANEL
Anion gap: 17 — ABNORMAL HIGH (ref 5–15)
BUN: 37 mg/dL — ABNORMAL HIGH (ref 6–20)
CO2: 32 mmol/L (ref 22–32)
Calcium: 10 mg/dL (ref 8.9–10.3)
Chloride: 87 mmol/L — ABNORMAL LOW (ref 101–111)
Creatinine, Ser: 1.38 mg/dL — ABNORMAL HIGH (ref 0.44–1.00)
GFR calc Af Amer: 47 mL/min — ABNORMAL LOW (ref >60)
GFR calc non Af Amer: 40 mL/min — ABNORMAL LOW (ref >60)
Glucose, Bld: 310 mg/dL — ABNORMAL HIGH (ref 65–99)
Potassium: 3.8 mmol/L (ref 3.5–5.1)
Sodium: 136 mmol/L (ref 135–145)

## 2016-04-09 MED ORDER — PREDNISONE 10 MG (21) PO TBPK: ORAL_TABLET | ORAL | 0 refills | Status: DC

## 2016-04-09 MED ORDER — DOXYCYCLINE HYCLATE 100 MG PO TABS: 100.0000 mg | ORAL_TABLET | Freq: Two times a day (BID) | ORAL | 0 refills | Status: DC

## 2016-04-09 NOTE — Progress Notes (Signed)
Patient does not qualify for home O2. Per MD note patient does not require O2 at this time. Sats 90-92% on Ra; asymptomatic.  Reviewed discharge instructions and medications with patient and family per patient's request; all questions answered. IV removed and hemostasis achieved.  Patient leaving unit in stable condition via wheelchair. Assessment is as charted.

## 2016-04-09 NOTE — Discharge Summary (Signed)
Physician Discharge Summary  Kathleen Hatfield KZL:[id number] DOB: 12/24/1955 DOA: 04/02/2016  PCP: Ann Held, MD  Admit date: 04/02/2016 Discharge date: 04/09/2016  Admitted From: Home Disposition: Home  Recommendations for Outpatient Follow-up:  1. Follow up with PCP in 1-2 weeks 2. Please obtain BMP/CBC in one week. 3. Doxycycline for 3 more days.  Home Health: PT Equipment/Devices:NA  Discharge Condition: Stable CODE STATUS: Full Code Diet recommendation: Diet heart healthy/carb modified Room service appropriate? Yes; Fluid consistency: Thin Diet - low sodium heart healthy  Brief/Interim Summary: 61 year old Caucasian female with a past medical history of obesity, hypertension, small airway disease/asthma, obstructive sleep apnea, presented with complaints of shortness of breath and cough. Patient was recently diagnosed with the influenza and was started on Tamiflu. She was thought to have acute bronchitis. On 1/26 patient started feeling poorly. She had more shortness of breath and was noted to be more hypoxic. She was transferred to stepdown unit. Placed on BiPAP. Critical care medicine was consulted. Patient has improved.  Discharge Diagnoses:  Principal Problem:   Respiratory distress Active Problems:   Hypertension   Hyperlipidemia   Mild persistent chronic asthma without complication   Chronic diastolic heart failure (HCC)   Morbid obesity (HCC)   Acute bronchitis   Acute renal failure superimposed on stage 2 chronic kidney disease (HCC)   Acute hypoxemic respiratory failure (HCC)   Renal insufficiency   Influenza A   Acute hypoxic respiratory failure With oxygen saturation in the upper 80s, this is likely secondary to acute bronchitis and influenza. VQ scan negative for PE and lower extremity Dopplers negative for DVT. Presented with respiratory distress, cough wheezing and history of production. No desaturation on discharge. This is resolved.  Acute bronchitis and  influenza A infection Presented with SOB and respiratory distress as well as cough/sputum production. Also she was been hypoxic for sometimes, green sputum suggesting bacterial infection and PCR positive for influenza A. Treated with antibiotics and IV steroids. (Treated with ceftriaxone and doxycycline while she is in the hospital). Also supported with bronchodilators, mucolytics, antitussives and oxygen as needed. On discharge doxycycline for 3 more days, finished her Tamiflu course.  History of chronic persistent asthma and obstructive sleep apnea  Bronchodilators, continued, CPAP at night.  Acute renal failure superimposed on stage II chronic kidney disease.  Creatinine is stable. Continue home medications. No need for any IV fluids currently. She remains on torsemide.  Chronic diastolic heart failure Appears compensated. Echocardiogram report as above. Echo 3 years ago reveals an EF of 65% and grade 2 diastolic dysfunction. Daily weights.BNP was however normal.  Her home dose of torsemide restarted, given extra to Lasix to keep output > intake.  Diabetes mellitus type II on oral agents HbA1c 7.6. Patient was started on Lantus and dose was increased on 1/29.  CBGs were elevated in the hospital secondaryto steroids induced hyperglycemia.  Leukocytosis  WBCs were 22.5 on discharge while patient felt very okay, this is likely secondary to steroids. Patient details of discharge was on Solu-Medrol, and discharged on prednisone taper.  Essential Hypertension Monitor blood pressures closely.    Discharge Instructions  Discharge Instructions    Diet - low sodium heart healthy    Complete by:  As directed    Increase activity slowly    Complete by:  As directed      Allergies as of 04/09/2016      Reactions   Almond Oil Anaphylaxis   Ceclor [cefaclor] Nausea And Vomiting   Ciprofloxacin Other (  See Comments)   Makes joints and muscles ache   Levaquin [levofloxacin] Other  (See Comments)   Body aches   Morphine And Related    Pt. States while in the hospital it affected her breathing, O2 dropped to the 70's   Sulfa Antibiotics Nausea And Vomiting      Medication List    STOP taking these medications   oseltamivir 75 MG capsule Commonly known as:  TAMIFLU   predniSONE 20 MG tablet Commonly known as:  DELTASONE Replaced by:  predniSONE 10 MG (21) Tbpk tablet   pseudoephedrine 120 MG 12 hr tablet Commonly known as:  SUDAFED     TAKE these medications   acetaminophen 500 MG tablet Commonly known as:  TYLENOL Take 500 mg by mouth every 4 (four) hours as needed (pain).   ADVAIR HFA 115-21 MCG/ACT inhaler Generic drug:  fluticasone-salmeterol INHALE 2 PUFFS INTO THE LUNGS 2 TIMES DAILY.   albuterol (2.5 MG/3ML) 0.083% nebulizer solution Commonly known as:  PROVENTIL Take 3 mLs (2.5 mg total) by nebulization every 4 (four) hours as needed for wheezing.   PROAIR HFA 108 (90 Base) MCG/ACT inhaler Generic drug:  albuterol INHALE 2 PUFFS INTO THE LUNGS EVERY FOUR HOURS AS NEEDED FOR WHEEZING.   AMITIZA 24 MCG capsule Generic drug:  lubiprostone Take 1 capsule by mouth 2 (two) times daily as needed for constipation.   baclofen 5 mg Tabs tablet Commonly known as:  LIORESAL Take 2.5 mg by mouth 2 (two) times daily as needed for muscle spasms.   BAYER CONTOUR NEXT TEST test strip Generic drug:  glucose blood As directed   doxycycline 100 MG tablet Commonly known as:  VIBRA-TABS Take 1 tablet (100 mg total) by mouth 2 (two) times daily.   DULoxetine 60 MG capsule Commonly known as:  CYMBALTA Take 60 mg by mouth 2 (two) times daily.   estradiol 2 MG tablet Commonly known as:  ESTRACE Take 1 tablet (2 mg total) by mouth daily.   fenofibrate 145 MG tablet Commonly known as:  TRICOR Take 145 mg by mouth at bedtime.   folic acid 1 MG tablet Commonly known as:  FOLVITE Take 1 mg by mouth 2 (two) times daily.   gabapentin 100 MG  capsule Commonly known as:  NEURONTIN Take 300 mg by mouth at bedtime.   GAVISCON PO Take 2 tablets by mouth at bedtime as needed (reflux).   GLOBAL EASE INJECT PEN NEEDLES 32G X 4 MM Misc Generic drug:  Insulin Pen Needle   guaiFENesin-codeine 100-10 MG/5ML syrup Take 10 mLs by mouth every 6 (six) hours as needed for cough.   LORazepam 1 MG tablet Commonly known as:  ATIVAN Take 1 tablet by mouth at bedtime.   metFORMIN 750 MG 24 hr tablet Commonly known as:  GLUCOPHAGE-XR Take 750 mg by mouth daily.   MICROLET LANCETS Misc As directed   ondansetron 4 MG disintegrating tablet Commonly known as:  ZOFRAN ODT Take 1 tablet (4 mg total) by mouth every 8 (eight) hours as needed for nausea or vomiting.   pantoprazole 40 MG tablet Commonly known as:  PROTONIX Take 40 mg by mouth daily.   potassium chloride 10 MEQ tablet Commonly known as:  K-DUR,KLOR-CON Take 30 mEq by mouth daily.   predniSONE 10 MG (21) Tbpk tablet Commonly known as:  STERAPRED UNI-PAK 21 TAB Take 6-5-4-3-2-1 PO orally till gone Replaces:  predniSONE 20 MG tablet   PRESCRIPTION MEDICATION Inhale into the lungs at bedtime. CPAP  rOPINIRole 4 MG tablet Commonly known as:  REQUIP Take 4 mg by mouth 2 (two) times daily.   rosuvastatin 10 MG tablet Commonly known as:  CRESTOR Take 10 mg by mouth once a week. On Saturday or Sunday   torsemide 20 MG tablet Commonly known as:  DEMADEX TAKE 3 TABLETS DAILY What changed:  See the new instructions.   traMADol 50 MG tablet Commonly known as:  ULTRAM Take 1.5-2 tablets (75-100 mg total) by mouth every 6 (six) hours as needed for severe pain.   traZODone 50 MG tablet Commonly known as:  DESYREL Take 50 mg by mouth at bedtime as needed for sleep.   TRESIBA FLEXTOUCH 100 UNIT/ML Sopn FlexTouch Pen Generic drug:  insulin degludec Inject 15 Units into the skin at bedtime. Max of 60 units/day   VICTOZA 18 MG/3ML Sopn Generic drug:  liraglutide Inject  1.2 mg into the skin every evening.   Vitamin D-3 5000 units Tabs Take 5,000 Units by mouth daily.      Follow-up Information    [facility] Follow up.   Specialty:  Home Health Services Why:  For home health PT. They will contact you 1-2 days after discharge to set up your first home visit.  Contact information: [place] [address] [phone]          Allergies  Allergen Reactions  . Almond Oil Anaphylaxis  . Ceclor [Cefaclor] Nausea And Vomiting  . Ciprofloxacin Other (See Comments)    Makes joints and muscles ache  . Levaquin [Levofloxacin] Other (See Comments)    Body aches  . Morphine And Related     Pt. States while in the hospital it affected her breathing, O2 dropped to the 70's  . Sulfa Antibiotics Nausea And Vomiting    Consultations: PCCM  Procedures (Echo, Carotid, EGD, Colonoscopy, ERCP)   Radiological studies: Dg Chest 2 View  Result Date: 04/05/2016 CLINICAL DATA:  Shortness of Breath EXAM: CHEST  2 VIEW COMPARISON:  04/03/2016 FINDINGS: Mild cardiomegaly. No confluent airspace opacities or effusions. No acute bony abnormality. IMPRESSION: Mild cardiomegaly.  No active disease. Electronically Signed   By: Rolm Baptise M.D.   On: 04/05/2016 13:23   Dg Chest 2 View  Result Date: 04/02/2016 CLINICAL DATA:  Cough and dyspnea.  Fever x1 week. EXAM: CHEST  2 VIEW COMPARISON:  03/31/2016 bold FINDINGS: Upper lobe predominant hyperinflation. Crowding of lower lobe interstitial lung markings. Minimal spurring along the periphery of the left lung base. Apical pleuroparenchymal scarring and thickening. Heart is borderline enlarged with aortic atherosclerosis. No aneurysm. No suspicious osseous lesions. No pneumothorax, effusion or pulmonary consolidation. IMPRESSION: No acute cardiopulmonary disease. Mild upper lobe predominant hyperinflation of the lungs with crowding of lower lobe interstitial lung markings. No alveolar  consolidations to suggest pneumonia. No CHF Electronically Signed   By: Ashley Royalty M.D.   On: 04/02/2016 03:41   Dg Chest 2 View  Result Date: 03/31/2016 CLINICAL DATA:  61 year old female with shortness of breath and productive cough. EXAM: CHEST  2 VIEW COMPARISON:  Chest radiograph dated 11/19/2015 FINDINGS: There is mild chronic appearing interstitial coarsening with probable mild bronchiectatic changes in the lung bases. There is no focal consolidation, pleural effusion, or pneumothorax. Stable top-normal cardiac size. No acute osseous pathology. IMPRESSION: No active cardiopulmonary disease. Electronically Signed   By: Anner Crete M.D.   On: 03/31/2016 21:17   Nm Pulmonary Perf And Vent  Result Date: 04/05/2016 CLINICAL DATA:  61 year old female with shortness of breath and hypoxia. EXAM: NUCLEAR MEDICINE VENTILATION - PERFUSION LUNG SCAN TECHNIQUE: Ventilation images were obtained in multiple projections using inhaled aerosol Tc-45mDTPA. Perfusion images were obtained in multiple projections after intravenous injection of Tc-984mAA. RADIOPHARMACEUTICALS:  32.8 mCi Technetium-9961mPA aerosol inhalation and 4.38 mCi Technetium-32m29m IV COMPARISON:  None. FINDINGS: Ventilation: No focal ventilation defect. Perfusion: No wedge shaped peripheral perfusion defects to suggest acute pulmonary embolism. IMPRESSION: Normal.  No evidence of pulmonary emboli. Electronically Signed   By: JeffMargarette Canada.   On: 04/05/2016 13:38   Dg Chest Port 1 View  Result Date: 04/03/2016 CLINICAL DATA:  Hypoxia today. EXAM: PORTABLE CHEST 1 VIEW COMPARISON:  PA and lateral chest 03/31/2016 and 04/02/2016. CT chest 02/10/2013. FINDINGS: There is mild cardiomegaly without edema. No consolidative process, pneumothorax or effusion. No acute bony abnormality. Aortic atherosclerosis noted. IMPRESSION: Cardiomegaly without acute disease. Electronically Signed   By: ThomInge Rise.   On: 04/03/2016 13:01      Subjective:  Discharge Exam: Vitals:   04/09/16 0619 04/09/16 0824 04/09/16 0825 04/09/16 0927  BP: 135/79  (!) 156/62   Pulse: 71  76 85  Resp: 18  18 16   Temp: 97.8 F (36.6 C)  98 F (36.7 C)   TempSrc: Axillary  Oral   SpO2: 96% 95% 96% 97%  Weight:      Height:       General: Pt is alert, awake, not in acute distress Cardiovascular: RRR, S1/S2 +, no rubs, no gallops Respiratory: CTA bilaterally, no wheezing, no rhonchi Abdominal: Soft, NT, ND, bowel sounds + Extremities: no edema, no cyanosis   The results of significant diagnostics from this hospitalization (including imaging, microbiology, ancillary and laboratory) are listed below for reference.    Microbiology: Recent Results (from the past 240 hour(s))  MRSA PCR Screening     Status: Abnormal   Collection Time: 04/02/16  3:51 PM  Result Value Ref Range Status   MRSA by PCR POSITIVE (A) NEGATIVE Final    Comment:        The GeneXpert MRSA Assay (FDA approved for NASAL specimens only), is one component of a comprehensive MRSA colonization surveillance program. It is not intended to diagnose MRSA infection nor to guide or monitor treatment for MRSA infections. RESULT CALLED TO, READ BACK BY AND VERIFIED WITH: R ARWilson [id number] 04/02/16 A BROWNING      Labs: BNP (last 3 results)  Recent Labs  04/03/16 1601  BNP 51.481.1asic Metabolic Panel:  Recent Labs Lab 04/05/16 0317 04/06/16 0256 04/07/16 0421 04/08/16 0629 04/09/16 0621  NA 138 136 138 135 136  K 4.2 4.2 4.2 4.0 3.8  CL 90* 87* 85* 89* 87*  CO2 33* 36* 37* 34* 32  GLUCOSE 274* 260* 308* 297* 310*  BUN 36* 33* 35* 31* 37*  CREATININE 1.32* 1.39* 1.29* 1.25* 1.38*  CALCIUM 8.9 9.5 9.7 9.3 10.0   Liver Function Tests: No results for input(s): AST, ALT, ALKPHOS, BILITOT, PROT, ALBUMIN in the last 168 hours. No results for input(s): LIPASE, AMYLASE in the last 168 hours. No results for input(s): AMMONIA in the last 168  hours. CBC:  Recent Labs Lab 04/05/16 0317 04/06/16 0256 04/07/16 0421 04/08/16 0629 04/09/16 0621  WBC 16.7* 20.8* 19.2* 20.5* 22.5*  HGB 11.5* 12.6 12.8 13.1 14.7  HCT 36.5 39.2 40.3 41.6 46.5*  MCV 76.7* 77.2* 76.9* 77.3* 77.8*  PLT 451* 489* 490* 454* 519*  Cardiac Enzymes:  Recent Labs Lab 04/03/16 1601 04/03/16 2128 04/04/16 0236  TROPONINI 0.03* <0.03 <0.03   BNP: Invalid input(s): POCBNP CBG:  Recent Labs Lab 04/08/16 0820 04/08/16 1139 04/08/16 1638 04/08/16 2132 04/09/16 0806  GLUCAP 277* 311* 381* 259* 324*   D-Dimer No results for input(s): DDIMER in the last 72 hours. Hgb A1c No results for input(s): HGBA1C in the last 72 hours. Lipid Profile No results for input(s): CHOL, HDL, LDLCALC, TRIG, CHOLHDL, LDLDIRECT in the last 72 hours. Thyroid function studies No results for input(s): TSH, T4TOTAL, T3FREE, THYROIDAB in the last 72 hours.  Invalid input(s): FREET3 Anemia work up No results for input(s): VITAMINB12, FOLATE, FERRITIN, TIBC, IRON, RETICCTPCT in the last 72 hours. Urinalysis    Component Value Date/Time   COLORURINE YELLOW 04/14/2015 1501   APPEARANCEUR CLEAR 04/14/2015 1501   LABSPEC 1.021 04/14/2015 1501   PHURINE 7.5 04/14/2015 1501   GLUCOSEU NEGATIVE 04/14/2015 1501   HGBUR NEGATIVE 04/14/2015 1501   BILIRUBINUR NEGATIVE 04/14/2015 1501   KETONESUR NEGATIVE 04/14/2015 1501   PROTEINUR NEGATIVE 04/14/2015 1501   UROBILINOGEN 0.2 09/19/2008 1400   NITRITE NEGATIVE 04/14/2015 1501   LEUKOCYTESUR NEGATIVE 04/14/2015 1501   Sepsis Labs Invalid input(s): PROCALCITONIN,  WBC,  LACTICIDVEN Microbiology Recent Results (from the past 240 hour(s))  MRSA PCR Screening     Status: Abnormal   Collection Time: 04/02/16  3:51 PM  Result Value Ref Range Status   MRSA by PCR POSITIVE (A) NEGATIVE Final    Comment:        The GeneXpert MRSA Assay (FDA approved for NASAL specimens only), is one component of a comprehensive MRSA  colonization surveillance program. It is not intended to diagnose MRSA infection nor to guide or monitor treatment for MRSA infections. RESULT CALLED TO, READ BACK BY AND VERIFIED WITHWilson Singer RN [phone] 04/02/16 A BROWNING      Time coordinating discharge: Over 30 minutes  SIGNED:   Birdie Hopes, MD  [facility] 04/09/2016, 10:06 AM Pager   If 7PM-7AM, please contact night-coverage [email or web address] Password [email or web address]

## 2016-04-09 NOTE — Progress Notes (Signed)
SATURATION QUALIFICATIONS: (This note is used to comply with regulatory documentation for home oxygen)  Patient Saturations on Room Air at Rest = 92%  Patient Saturations on Room Air while Ambulating = 89-90%  Patient Saturations on 2 Liters of oxygen while Ambulating = 95%  Please briefly explain why patient needs home oxygen: Patient O2 sats decrease to 89-90% on room air while ambulating.  Patient complains of dizziness while ambulating without 2L O2.

## 2016-04-10 DIAGNOSIS — J449 Chronic obstructive pulmonary disease, unspecified: Secondary | ICD-10-CM | POA: Diagnosis not present

## 2016-04-10 DIAGNOSIS — R531 Weakness: Secondary | ICD-10-CM | POA: Diagnosis not present

## 2016-04-13 ENCOUNTER — Other Ambulatory Visit: Payer: Self-pay

## 2016-04-13 DIAGNOSIS — G4733 Obstructive sleep apnea (adult) (pediatric): Secondary | ICD-10-CM

## 2016-04-14 ENCOUNTER — Ambulatory Visit (INDEPENDENT_AMBULATORY_CARE_PROVIDER_SITE_OTHER): Payer: Medicare HMO | Admitting: Pulmonary Disease

## 2016-04-14 ENCOUNTER — Encounter: Payer: Self-pay | Admitting: Pulmonary Disease

## 2016-04-14 DIAGNOSIS — J4530 Mild persistent asthma, uncomplicated: Secondary | ICD-10-CM

## 2016-04-14 DIAGNOSIS — G4710 Hypersomnia, unspecified: Secondary | ICD-10-CM | POA: Diagnosis not present

## 2016-04-14 DIAGNOSIS — J449 Chronic obstructive pulmonary disease, unspecified: Secondary | ICD-10-CM | POA: Diagnosis not present

## 2016-04-14 DIAGNOSIS — J101 Influenza due to other identified influenza virus with other respiratory manifestations: Secondary | ICD-10-CM

## 2016-04-14 DIAGNOSIS — G4733 Obstructive sleep apnea (adult) (pediatric): Secondary | ICD-10-CM | POA: Diagnosis not present

## 2016-04-14 DIAGNOSIS — G4730 Sleep apnea, unspecified: Secondary | ICD-10-CM

## 2016-04-14 DIAGNOSIS — R531 Weakness: Secondary | ICD-10-CM | POA: Diagnosis not present

## 2016-04-14 NOTE — Assessment & Plan Note (Signed)
Pt has hypersomnia despite CPAP use. Significantly better the last 5 months. (04/2016) OSA has always been corrected.  corrected. Hypersomnia multifactorial: 2/2 OSA and sedating meds.  ABG in 2017 with no evidence for hypercapnea.  She was taking  Gabapentin, Ativan, Trazodone, Requip.  She was able to wean off ativan.  She cut down gabapentin to 300 mg hs from 400 mg hs.  Cont with trazodone and requip.  She is fxnal and not too sleepy with her current meds.  Observe for now.

## 2016-04-14 NOTE — Assessment & Plan Note (Signed)
Patient is being seen for hypersomnia and OSA.  Pt had PSG in 08/2012 moderate OSA, optimal on cpap 13 cm water.  Always with good compliance and OSA corrected.  DL for Aug  2017 > 100%, AHI 0.8. Cont CPAP use.  Pt with hypersomnia related to meds (discussed in a different problem).  Hypersomnia has been better.  Almost not an issue.   Plan:  Continue with  cpap 13.  Will need a DL. No issues.    Patient was instructed to have mask, tubings, filter, reservoir cleaned at least once a week with soapy water.  Patient was instructed to call the office if he/she is having issues with the PAP device.    I advised patient to obtain sufficient amount of sleep --  7 to 8 hours at least in a 24 hr period.  Patient was advised to follow good sleep hygiene.  Patient was advised NOT to engage in activities requiring concentration and/or vigilance if he/she is and  sleepy.  Patient is NOT to drive if he/she is sleepy.

## 2016-04-14 NOTE — Patient Instructions (Signed)
It was a pleasure taking care of you today!  Finish off prednisone. Continue Advair, 2 puffs twice a day. Rinse mouth with each use.  Start Incruse 1 puff daily.  Take protonix daily- twice a day.  Continue with cpap.  Call the office if you start getting worse.   Return to clinic in June with Dr. Corrie Dandy

## 2016-04-14 NOTE — Progress Notes (Signed)
Subjective:    Patient ID: Kathleen Hatfield, female    DOB: 1955/07/17, 61 y.o.   MRN: [id number]  HPI Pt is being seen by the practice for SOB/ small airways dse/ asthma  and OSA.  ROV (06/07/15) Patient is here for follow-up on her sleep apnea. She feels tried despite using cpap.  Last download from December 2016 until March 2017 showed AHI of 0.5, 61% compliance. Last ABG in the system showed pH of 7.52, CO2 46, PO2 34 (venous blood gas). She sleeps 5-7 hrs/ 24 hrs period. Feels benefit of using cpap. More energy. A lot of stress with mother having dementia and she has health issues. Uses ativan prn. No scheduled AD. Recent cbc showed mild anemia, mildly increased creat. TSH was N.   ROV (09/16/15)  Pt is here for f/u on her sleepiness and OSA.  Since last seen, she states she continues to be sleepy. She sleeps 10 hrs ina 24 hr period. Feels unrefreshed when she wakes up. Uses cpap. Feels better using it. No issues with it. DL for 3 mos > 91% and AHI of 0.8. She takes gabapentin 400 mg, Ativan 1 mg, Trazodone 50 mg, Reqiup 4 mg BID > all at bedtime. ABG in 06/2015 > 7.48/36/86 on RA. Asthma has been stable; uses advir 2P BID. Not on pred. Not on o2.   ROV (12/11/2015)  Pt is here as f/u on her osa.  Since last seen, she had her hernia repair at [facility] on 9/8. Her surgery went OK but had pain issues.  She ended up getting a lot of pain meds post op.  She had hypersomnia with pain meds. Pain meds were weaned off.  She feels better now.  DL the last month > 100%, AHI 0.8. Still with feeling of "tiredness/hypersomnia/fatigue".   ROV 04/14/2016 Pt is here for f/u on her osa, asthma and hypersomnia.  Since last seen, he OSA has been stable and controlled.  Her hypersomnia is also improved as she was able to wean of ativan and was able to cut down the neurontin dose.  She still takes trazodone at 50 mg at HS.  She was admitted end of January for Severe influenza PNA for which she was on Bipap and SDU.  Was treated with  abx and prednsione and tamiflu.  She was discharged after 1 week.  Finishing off prednisone (2 more days).  Finished abx.  Starting to use cpap. Cough is slowly better. No on O2. GERD flared up.   Review of Systems  Constitutional: Positive for fatigue.       Hypersomnia, tiredness  HENT: Negative.   Eyes: Negative.   Respiratory: Positive for cough and shortness of breath.   Cardiovascular: Negative.   Gastrointestinal: Negative.   Endocrine: Negative.   Genitourinary: Negative.   Musculoskeletal: Negative.   Allergic/Immunologic: Negative.   Neurological: Negative.   Hematological: Negative.   Psychiatric/Behavioral: Negative.   All other systems reviewed and are negative.     Objective:   Physical Exam   Vitals:  Vitals:   04/14/16 1632  BP: 130/88  Pulse: 98  Temp: 98.5 F (36.9 C)  TempSrc: Oral  SpO2: 96%  Weight: 215 lb (97.5 kg)  Height: 5' 5"  (1.651 m)    Constitutional/General:  Pleasant, well-nourished, well-developed, not in any distress,  Comfortably seating.  Well kempt. Chronically ill.   Body mass index is 35.78 kg/m. Wt Readings from Last 3 Encounters:  04/14/16 215 lb (97.5 kg)  04/08/16  254 lb 9.6 oz (115.5 kg)  12/11/15 269 lb 9.6 oz (122.3 kg)    HEENT: Pupils equal and reactive to light and accommodation. Anicteric sclerae. Normal nasal mucosa.   No oral  lesions,  mouth clear,  oropharynx clear, no postnasal drip. (-) Oral thrush. No dental caries.  Airway - Mallampati class IV  Neck: No masses. Midline trachea. No JVD, (-) LAD. (-) bruits appreciated.  Respiratory/Chest: Grossly normal chest. (-) deformity. (-) Accessory muscle use.  Symmetric expansion. (-) Tenderness on palpation.  Resonant on percussion.  Diminished BS on both lower lung zones. (-) rhonchi. Crackles at bases. Occasional wheeze in BULF.  (-) egophony  Cardiovascular: Regular rate and  rhythm, heart sounds normal, no murmur or gallops, Gr 1  edema  Gastrointestinal:  Normal bowel sounds. Soft, non-tender. No hepatosplenomegaly.  (-) masses.   Musculoskeletal:  Normal muscle tone. Normal gait.   Extremities: Grossly normal. (-) clubbing, cyanosis.  Gr 1  edema  Skin: (-) rash,lesions seen.   Neurological/Psychiatric : alert, oriented to time, place, person. Normal mood and affect           Assessment & Plan:  OSA (obstructive sleep apnea) Patient is being seen for hypersomnia and OSA.  Pt had PSG in 08/2012 moderate OSA, optimal on cpap 13 cm water.  Always with good compliance and OSA corrected.  DL for Aug  2017 > 100%, AHI 0.8. Cont CPAP use.  Pt with hypersomnia related to meds (discussed in a different problem).  Hypersomnia has been better.  Almost not an issue.   Plan:  Continue with  cpap 13.  Will need a DL. No issues.    Patient was instructed to have mask, tubings, filter, reservoir cleaned at least once a week with soapy water.  Patient was instructed to call the office if he/she is having issues with the PAP device.    I advised patient to obtain sufficient amount of sleep --  7 to 8 hours at least in a 24 hr period.  Patient was advised to follow good sleep hygiene.  Patient was advised NOT to engage in activities requiring concentration and/or vigilance if he/she is and  sleepy.  Patient is NOT to drive if he/she is sleepy.     Hypersomnia with sleep apnea Pt has hypersomnia despite CPAP use. Significantly better the last 5 months. (04/2016) OSA has always been corrected.  corrected. Hypersomnia multifactorial: 2/2 OSA and sedating meds.  ABG in 2017 with no evidence for hypercapnea.  She was taking  Gabapentin, Ativan, Trazodone, Requip.  She was able to wean off ativan.  She cut down gabapentin to 300 mg hs from 400 mg hs.  Cont with trazodone and requip.  She is fxnal and not too sleepy with her current meds.  Observe for now.   Influenza A Recent influenza A (03/2016) Slow to  improve. S/P Tamiflu and  Finishing Doxycycline.  Wean off prenisone.  Avoid prednisone with recent influenza.   Mild persistent chronic asthma without complication Pt with chronic asthma.  Asthma seems stable until 03/2016 with influenza A admisssion.   02/2013: CT Chest : consistent with small airway obstruction [facility] 02/2013: normal, no pulm HTN Allergy profile 04/14/14   Eos 0.3/ IgE 196 with Dust> Cat > Dog  PFT's  07/02/2015  FEV1 1.50 (56 % ) ratio 69  p 25 % improvement from saba p no rx prior to study with DLCO  60 % corrects to 99 % for alv volume  With ERV 15% and moderate restrictive component c/w obesity  Cont advair 2 P BID, cont alb prn.  Will add incruse for the next 2 weeks to help her lungs recover. Still with wheezing. pred being weaned off.  Will need Prevnar 13 on f/u.  She got PNA 23 2-3 yrs ago.   Morbid obesity ([place]) Weight reduction    RTC in June.      Monica Becton, MD 04/14/2016, 5:27 PM [facility] Pager [phone] After 3 pm or if no answer, call [phone]

## 2016-04-14 NOTE — Progress Notes (Signed)
   Subjective:    Patient ID: Kathleen Hatfield, female    DOB: [birth year], 61 y.o.   MRN: [id number]  HPI    Review of Systems     Objective:   Physical Exam        Assessment & Plan:

## 2016-04-14 NOTE — Assessment & Plan Note (Signed)
Weight reduction 

## 2016-04-14 NOTE — Assessment & Plan Note (Signed)
Recent influenza A (03/2016) Slow to improve. S/P Tamiflu and  Finishing Doxycycline.  Wean off prenisone.  Avoid prednisone with recent influenza.

## 2016-04-14 NOTE — Assessment & Plan Note (Signed)
Pt with chronic asthma.  Asthma seems stable until 03/2016 with influenza A admisssion.   02/2013: CT Chest : consistent with small airway obstruction [facility] 02/2013: normal, no pulm HTN Allergy profile 04/14/14   Eos 0.3/ IgE 196 with Dust> Cat > Dog  PFT's  07/02/2015  FEV1 1.50 (56 % ) ratio 69  p 25 % improvement from saba p no rx prior to study with DLCO  60 % corrects to 99 % for alv volume  With ERV 15% and moderate restrictive component c/w obesity  Cont advair 2 P BID, cont alb prn.  Will add incruse for the next 2 weeks to help her lungs recover. Still with wheezing. pred being weaned off.  Will need Prevnar 13 on f/u.  She got PNA 23 2-3 yrs ago.

## 2016-04-17 DIAGNOSIS — J449 Chronic obstructive pulmonary disease, unspecified: Secondary | ICD-10-CM | POA: Diagnosis not present

## 2016-04-17 DIAGNOSIS — R531 Weakness: Secondary | ICD-10-CM | POA: Diagnosis not present

## 2016-04-20 DIAGNOSIS — K6289 Other specified diseases of anus and rectum: Secondary | ICD-10-CM | POA: Diagnosis not present

## 2016-04-20 DIAGNOSIS — E1129 Type 2 diabetes mellitus with other diabetic kidney complication: Secondary | ICD-10-CM | POA: Diagnosis not present

## 2016-04-20 DIAGNOSIS — I2781 Cor pulmonale (chronic): Secondary | ICD-10-CM | POA: Diagnosis not present

## 2016-04-20 DIAGNOSIS — I10 Essential (primary) hypertension: Secondary | ICD-10-CM | POA: Diagnosis not present

## 2016-04-20 DIAGNOSIS — E1165 Type 2 diabetes mellitus with hyperglycemia: Secondary | ICD-10-CM | POA: Diagnosis not present

## 2016-04-21 DIAGNOSIS — J449 Chronic obstructive pulmonary disease, unspecified: Secondary | ICD-10-CM | POA: Diagnosis not present

## 2016-04-21 DIAGNOSIS — R531 Weakness: Secondary | ICD-10-CM | POA: Diagnosis not present

## 2016-04-23 DIAGNOSIS — R531 Weakness: Secondary | ICD-10-CM | POA: Diagnosis not present

## 2016-04-23 DIAGNOSIS — J449 Chronic obstructive pulmonary disease, unspecified: Secondary | ICD-10-CM | POA: Diagnosis not present

## 2016-04-28 DIAGNOSIS — J449 Chronic obstructive pulmonary disease, unspecified: Secondary | ICD-10-CM | POA: Diagnosis not present

## 2016-04-28 DIAGNOSIS — R531 Weakness: Secondary | ICD-10-CM | POA: Diagnosis not present

## 2016-04-30 DIAGNOSIS — R531 Weakness: Secondary | ICD-10-CM | POA: Diagnosis not present

## 2016-04-30 DIAGNOSIS — J449 Chronic obstructive pulmonary disease, unspecified: Secondary | ICD-10-CM | POA: Diagnosis not present

## 2016-05-04 DIAGNOSIS — R69 Illness, unspecified: Secondary | ICD-10-CM | POA: Diagnosis not present

## 2016-05-04 DIAGNOSIS — R531 Weakness: Secondary | ICD-10-CM | POA: Diagnosis not present

## 2016-05-04 DIAGNOSIS — J449 Chronic obstructive pulmonary disease, unspecified: Secondary | ICD-10-CM | POA: Diagnosis not present

## 2016-05-08 DIAGNOSIS — I959 Hypotension, unspecified: Secondary | ICD-10-CM | POA: Diagnosis not present

## 2016-05-12 DIAGNOSIS — J449 Chronic obstructive pulmonary disease, unspecified: Secondary | ICD-10-CM | POA: Diagnosis not present

## 2016-05-12 DIAGNOSIS — R531 Weakness: Secondary | ICD-10-CM | POA: Diagnosis not present

## 2016-05-12 DIAGNOSIS — I959 Hypotension, unspecified: Secondary | ICD-10-CM | POA: Diagnosis not present

## 2016-05-20 DIAGNOSIS — J449 Chronic obstructive pulmonary disease, unspecified: Secondary | ICD-10-CM | POA: Diagnosis not present

## 2016-05-20 DIAGNOSIS — R531 Weakness: Secondary | ICD-10-CM | POA: Diagnosis not present

## 2016-05-26 DIAGNOSIS — R531 Weakness: Secondary | ICD-10-CM | POA: Diagnosis not present

## 2016-05-26 DIAGNOSIS — J449 Chronic obstructive pulmonary disease, unspecified: Secondary | ICD-10-CM | POA: Diagnosis not present

## 2016-06-02 DIAGNOSIS — J449 Chronic obstructive pulmonary disease, unspecified: Secondary | ICD-10-CM | POA: Diagnosis not present

## 2016-06-02 DIAGNOSIS — R531 Weakness: Secondary | ICD-10-CM | POA: Diagnosis not present

## 2016-06-04 DIAGNOSIS — R300 Dysuria: Secondary | ICD-10-CM | POA: Diagnosis not present

## 2016-06-04 DIAGNOSIS — Z6841 Body Mass Index (BMI) 40.0 and over, adult: Secondary | ICD-10-CM | POA: Diagnosis not present

## 2016-06-04 DIAGNOSIS — N390 Urinary tract infection, site not specified: Secondary | ICD-10-CM | POA: Diagnosis not present

## 2016-06-23 DIAGNOSIS — R69 Illness, unspecified: Secondary | ICD-10-CM | POA: Diagnosis not present

## 2016-07-01 DIAGNOSIS — G4733 Obstructive sleep apnea (adult) (pediatric): Secondary | ICD-10-CM | POA: Diagnosis not present

## 2016-07-01 DIAGNOSIS — S060X9A Concussion with loss of consciousness of unspecified duration, initial encounter: Secondary | ICD-10-CM | POA: Diagnosis not present

## 2016-07-01 DIAGNOSIS — Z6841 Body Mass Index (BMI) 40.0 and over, adult: Secondary | ICD-10-CM | POA: Diagnosis not present

## 2016-07-01 DIAGNOSIS — E1165 Type 2 diabetes mellitus with hyperglycemia: Secondary | ICD-10-CM | POA: Diagnosis not present

## 2016-07-01 DIAGNOSIS — I2781 Cor pulmonale (chronic): Secondary | ICD-10-CM | POA: Diagnosis not present

## 2016-07-07 DIAGNOSIS — S0990XA Unspecified injury of head, initial encounter: Secondary | ICD-10-CM | POA: Diagnosis not present

## 2016-07-07 DIAGNOSIS — S060X9A Concussion with loss of consciousness of unspecified duration, initial encounter: Secondary | ICD-10-CM | POA: Diagnosis not present

## 2016-07-07 DIAGNOSIS — W19XXXA Unspecified fall, initial encounter: Secondary | ICD-10-CM | POA: Diagnosis not present

## 2016-08-01 DIAGNOSIS — G4733 Obstructive sleep apnea (adult) (pediatric): Secondary | ICD-10-CM | POA: Diagnosis not present

## 2016-08-25 ENCOUNTER — Ambulatory Visit: Payer: Medicare HMO | Admitting: Adult Health

## 2016-08-25 ENCOUNTER — Ambulatory Visit: Payer: Medicare HMO | Admitting: Pulmonary Disease

## 2016-08-25 ENCOUNTER — Other Ambulatory Visit: Payer: Self-pay | Admitting: Internal Medicine

## 2016-09-01 DIAGNOSIS — G4733 Obstructive sleep apnea (adult) (pediatric): Secondary | ICD-10-CM | POA: Diagnosis not present

## 2016-09-11 DIAGNOSIS — E1129 Type 2 diabetes mellitus with other diabetic kidney complication: Secondary | ICD-10-CM | POA: Diagnosis not present

## 2016-09-12 ENCOUNTER — Encounter (HOSPITAL_COMMUNITY): Payer: Self-pay | Admitting: Emergency Medicine

## 2016-09-12 ENCOUNTER — Emergency Department (HOSPITAL_COMMUNITY)
Admission: EM | Admit: 2016-09-12 | Discharge: 2016-09-12 | Disposition: A | Discharge status: Home / Self Care | Payer: Medicare HMO | Attending: Emergency Medicine | Admitting: Emergency Medicine

## 2016-09-12 ENCOUNTER — Emergency Department (HOSPITAL_COMMUNITY): Payer: Medicare HMO

## 2016-09-12 DIAGNOSIS — Z79899 Other long term (current) drug therapy: Secondary | ICD-10-CM | POA: Diagnosis not present

## 2016-09-12 DIAGNOSIS — N182 Chronic kidney disease, stage 2 (mild): Secondary | ICD-10-CM | POA: Insufficient documentation

## 2016-09-12 DIAGNOSIS — Z79810 Long term (current) use of selective estrogen receptor modulators (SERMs): Secondary | ICD-10-CM | POA: Diagnosis not present

## 2016-09-12 DIAGNOSIS — R0789 Other chest pain: Secondary | ICD-10-CM | POA: Insufficient documentation

## 2016-09-12 DIAGNOSIS — D649 Anemia, unspecified: Secondary | ICD-10-CM | POA: Diagnosis not present

## 2016-09-12 DIAGNOSIS — I509 Heart failure, unspecified: Secondary | ICD-10-CM

## 2016-09-12 DIAGNOSIS — J45909 Unspecified asthma, uncomplicated: Secondary | ICD-10-CM | POA: Insufficient documentation

## 2016-09-12 DIAGNOSIS — E876 Hypokalemia: Secondary | ICD-10-CM

## 2016-09-12 DIAGNOSIS — R0602 Shortness of breath: Secondary | ICD-10-CM | POA: Diagnosis not present

## 2016-09-12 DIAGNOSIS — R079 Chest pain, unspecified: Secondary | ICD-10-CM

## 2016-09-12 DIAGNOSIS — I130 Hypertensive heart and chronic kidney disease with heart failure and stage 1 through stage 4 chronic kidney disease, or unspecified chronic kidney disease: Secondary | ICD-10-CM | POA: Diagnosis not present

## 2016-09-12 DIAGNOSIS — I5032 Chronic diastolic (congestive) heart failure: Secondary | ICD-10-CM | POA: Insufficient documentation

## 2016-09-12 DIAGNOSIS — E1122 Type 2 diabetes mellitus with diabetic chronic kidney disease: Secondary | ICD-10-CM | POA: Insufficient documentation

## 2016-09-12 DIAGNOSIS — I110 Hypertensive heart disease with heart failure: Secondary | ICD-10-CM | POA: Diagnosis not present

## 2016-09-12 LAB — CBC
HCT: 36.9 % (ref 36.0–46.0)
Hemoglobin: 11.8 g/dL — ABNORMAL LOW (ref 12.0–15.0)
MCH: 26.6 pg (ref 26.0–34.0)
MCHC: 32 g/dL (ref 30.0–36.0)
MCV: 83.3 fL (ref 78.0–100.0)
Platelets: 342 10*3/uL (ref 150–400)
RBC: 4.43 MIL/uL (ref 3.87–5.11)
RDW: 16.2 % — ABNORMAL HIGH (ref 11.5–15.5)
WBC: 10 10*3/uL (ref 4.0–10.5)

## 2016-09-12 LAB — BASIC METABOLIC PANEL
Anion gap: 14 (ref 5–15)
BUN: 14 mg/dL (ref 6–20)
CO2: 30 mmol/L (ref 22–32)
Calcium: 9.6 mg/dL (ref 8.9–10.3)
Chloride: 90 mmol/L — ABNORMAL LOW (ref 101–111)
Creatinine, Ser: 1.12 mg/dL — ABNORMAL HIGH (ref 0.44–1.00)
GFR calc Af Amer: >60 mL/min (ref >60)
GFR calc non Af Amer: 52 mL/min — ABNORMAL LOW (ref >60)
Glucose, Bld: 200 mg/dL — ABNORMAL HIGH (ref 65–99)
Potassium: 2.7 mmol/L — CL (ref 3.5–5.1)
Sodium: 134 mmol/L — ABNORMAL LOW (ref 135–145)

## 2016-09-12 LAB — I-STAT TROPONIN, ED
Troponin i, poc: 0 ng/mL (ref 0.00–0.08)
Troponin i, poc: 0 ng/mL (ref 0.00–0.08)

## 2016-09-12 MED ORDER — FUROSEMIDE 10 MG/ML IJ SOLN
80.0000 mg | Freq: Once | INTRAMUSCULAR | Status: AC
  Administered 2016-09-12: 80 mg via INTRAVENOUS
  Filled 2016-09-12: qty 8

## 2016-09-12 MED ORDER — ASPIRIN 81 MG PO CHEW
162.0000 mg | CHEWABLE_TABLET | Freq: Once | ORAL | Status: AC
Start: 2016-09-12 — End: 2016-09-12
  Administered 2016-09-12: 162 mg via ORAL
  Filled 2016-09-12: qty 2

## 2016-09-12 MED ORDER — POTASSIUM CHLORIDE CRYS ER 20 MEQ PO TBCR
40.0000 meq | EXTENDED_RELEASE_TABLET | Freq: Once | ORAL | Status: AC
  Administered 2016-09-12: 40 meq via ORAL
  Filled 2016-09-12: qty 2

## 2016-09-12 MED ORDER — METOLAZONE 5 MG PO TABS: 5.0000 mg | ORAL_TABLET | Freq: Every day | ORAL | 0 refills | Status: DC

## 2016-09-12 MED ORDER — FUROSEMIDE 20 MG PO TABS: 20.0000 mg | ORAL_TABLET | Freq: Two times a day (BID) | ORAL | 0 refills | Status: DC

## 2016-09-12 MED ORDER — POTASSIUM CHLORIDE 10 MEQ/100ML IV SOLN: 10.0000 meq | Freq: Once | INTRAVENOUS | Status: DC

## 2016-09-12 MED ORDER — POTASSIUM CHLORIDE 10 MEQ/100ML IV SOLN
10.0000 meq | INTRAVENOUS | Status: AC
  Administered 2016-09-12 (×2): 10 meq via INTRAVENOUS
  Filled 2016-09-12 (×2): qty 100

## 2016-09-12 MED ORDER — POTASSIUM CHLORIDE CRYS ER 20 MEQ PO TBCR
60.0000 meq | EXTENDED_RELEASE_TABLET | Freq: Two times a day (BID) | ORAL | 0 refills | Status: DC
Start: 2016-09-12 — End: 2017-02-17

## 2016-09-12 NOTE — Discharge Instructions (Signed)
For the next 7 days , New rx of Lasix, potassium, and Zaroxolyn. Lasix 80 mg am and pm Zaroxolyn every am Potassium 79m am and pm See your doctor, next Friday as scheduled, for further instructions. Contact her physician on Monday. Please request lab draw be available for your appointment on Friday

## 2016-09-12 NOTE — ED Provider Notes (Signed)
[facility] DEPT Provider Note   CSN: [id number] Arrival date & time: 09/12/16  1315     History   Chief Complaint Chief Complaint  Patient presents with  . Chest Pain    HPI Kathleen Hatfield is a 61 y.o. female. Plan is chest pain, shortness of breath, and 10 pound weight gain  HPI:  61 year old female with history of obstructive sleep apnea, diastolic congestive heart failure, chronic renal insufficiency, diabetes, hypertension, hypercholesterolemia, peripheral neuropathy,  Ulcerative colitis.  She states for the last several days she has had increasing orthopnea. She always sleeps propped up. She's had no change in peripheral edema. She states her weight is up 10 pounds in the last 7 days. She continues to take Demadex and Zaroxolyn. Notices normal urine output for her. Today she states she was at rest and developed some discomfort in the right chest. States like a burning just to the right of the right sternum. However she states this was "different than heartburn which feels like acid". Had a "widening" feeling of her right shoulder and right lateral chest. Both of these descriptions of pain radiated to the right posterior chest.    No classic anginal type pain, or left neck left jaw left arm pain.  2D Echo 06/17/2012:  ------------------------------------------------------------ Study Conclusions  - Left ventricle: The cavity size was normal. Systolic function was normal. The estimated ejection fraction was in the range of 60% to 65%. Wall motion was normal; there were no regional wall motion abnormalities. Features are consistent with a pseudonormal left ventricular filling pattern, with concomitant abnormal relaxation and increased filling pressure (grade 2 diastolic Dysfunction). Myoview Scan 06/23/2012:    Overall Impression: Normal stress nuclear study. No evidence of ischemia.  LV Ejection Fraction: 82%. LV Wall Motion: NL LV Function; NL Wall Motion.    Right heart  catheterization 02/27/2013:  Hemodynamics  RA mean 13  RV 40/14  PA 31/16 mean 25  PCWP a wave 20, v wave 20, mean 17  Oxygen saturations:  PA 66  AO 98 (by finger probe)  Cardiac Output (Fick) 6.9  Cardiac Index (Fick) 3  Cardiac Output (thermo) 5.7  Cardiac Index (thermo) 2.5   Patient also underwent hospitalization for respiratory failure in January of this year. Thought to have influenza coupled with congestive heart failure, and reactive airways disease  Past Medical History:  Diagnosis Date  . Anemia   . Arthritis   . Asthma   . CHF (congestive heart failure) ([place]) 05/2014  . Chronic diastolic heart failure ([place])   . CKD (chronic kidney disease)   . Depression   . Diabetes mellitus without complication ([place])   . Diverticulitis   . Dyspnea    with exertion  . GERD (gastroesophageal reflux disease)   . History of blood transfusion   . Hyperlipidemia    cannot tolerate statins  . Hypertension   . Peripheral neuropathy   . Pneumonia   . PONV (postoperative nausea and vomiting)   . Sleep apnea    uses CPAP  . Ulcerative colitis [facility])    dr Collene Mares    Patient Active Problem List   Diagnosis Date Noted  . Influenza A   . Renal insufficiency   . Acute hypoxemic respiratory failure ([place]) 04/03/2016  . Acute bronchitis 04/02/2016  . Acute renal failure superimposed on stage 2 chronic kidney disease ([place]) 04/02/2016  . Respiratory distress 04/02/2016  . Diabetes mellitus without complication ([place])   . CKD (chronic kidney disease)   .  Stage 2 chronic kidney disease   . Solitary pulmonary nodule 11/20/2015  . Urinary retention 11/18/2015  . Hypoxemia 11/18/2015  . Incisional hernia s/p lap repair with mesh 11/15/2015 11/15/2015  . Hypersomnia with sleep apnea 09/16/2015  . Fatigue 06/07/2015  . Morbid obesity ([place]) 06/07/2015  . Cough 04/18/2015  . Folliculitis 87/68/1157  . Chronic diastolic heart failure ([place])   . OSA (obstructive sleep apnea) 08/23/2012  .  Hypertension   . Depression   . GERD (gastroesophageal reflux disease)   . Hyperlipidemia   . Mild persistent chronic asthma without complication   . Dyspnea   . Ulcerative colitis ([place])   . Patellar tendinitis 05/25/2011  . Knee pain 05/25/2011  . Myofascial pain 05/25/2011  . Cervicalgia 05/25/2011    Past Surgical History:  Procedure Laterality Date  . ABDOMINAL HYSTERECTOMY    . APPENDECTOMY    . BREAST SURGERY     left biopsy  . CARDIAC CATHETERIZATION    . CESAREAN SECTION    . CHOLECYSTECTOMY    . HERNIA REPAIR    . INSERTION OF MESH N/A 11/15/2015   Procedure: INSERTION OF MESH;  Surgeon: [name], MD;  Location: [facility] ORS;  Service: General;  Laterality: N/A;  . LAPAROSCOPIC LYSIS OF ADHESIONS N/A 11/15/2015   Procedure: LAPAROSCOPIC LYSIS OF ADHESIONS;  Surgeon: [name], MD;  Location: [facility] ORS;  Service: General;  Laterality: N/A;  . RIGHT HEART CATHETERIZATION N/A 02/27/2013   Procedure: RIGHT HEART CATH;  Surgeon: Blane Ohara, MD;  Location: [facility] CATH LAB;  Service: Cardiovascular;  Laterality: N/A;  . RIGHT HEART CATHETERIZATION N/A 12/14/2013   Procedure: RIGHT HEART CATH;  Surgeon: Larey Dresser, MD;  Location: [facility] CATH LAB;  Service: Cardiovascular;  Laterality: N/A;  . SIGMOIDECTOMY  2010   diverticular disease  . VENTRAL HERNIA REPAIR N/A 11/15/2015   Procedure: LAPAROSCOPIC VENTRAL WALL HERNIA REPAIR;  Surgeon: [name], MD;  Location: [facility] ORS;  Service: General;  Laterality: N/A;    OB History    No data available       Home Medications    Prior to Admission medications   Medication Sig Start Date End Date Taking? Authorizing Provider  acetaminophen (TYLENOL) 500 MG tablet Take 500 mg by mouth every 4 (four) hours as needed (pain).    Yes [provider]  ADVAIR HFA 115-21 MCG/ACT inhaler INHALE 2 PUFFS INTO THE LUNGS 2 TIMES DAILY. 08/25/16  Yes Parrett, Tammy S, NP  albuterol (PROVENTIL) (2.5 MG/3ML) 0.083% nebulizer solution Take 3  mLs (2.5 mg total) by nebulization every 4 (four) hours as needed for wheezing. 08/26/15  Yes Tanda Rockers, MD  Alum Hydroxide-Mag Carbonate (GAVISCON PO) Take 2 tablets by mouth at bedtime as needed (reflux).    Yes [provider]  baclofen (LIORESAL) 5 mg TABS tablet Take 2.5 mg by mouth 2 (two) times daily as needed for muscle spasms.  03/27/13  Yes Dixon Boos, MD  Cholecalciferol (VITAMIN D-3) 5000 UNITS TABS Take 5,000 Units by mouth daily.    Yes [provider]  DULoxetine (CYMBALTA) 60 MG capsule Take 60 mg by mouth 2 (two) times daily.    Yes [provider]  estradiol (ESTRACE) 2 MG tablet Take 1 tablet (2 mg total) by mouth daily. 07/29/11  Yes Rivard, Katharine Look, MD  fenofibrate (TRICOR) 145 MG tablet Take 145 mg by mouth at bedtime.  09/28/14  Yes [provider]  folic acid (FOLVITE) 1 MG tablet Take  1 mg by mouth 2 (two) times daily.   Yes [provider]  gabapentin (NEURONTIN) 100 MG capsule Take 400 mg by mouth at bedtime.    Yes [provider]  ibuprofen (ADVIL,MOTRIN) 200 MG tablet Take 200 mg by mouth every 6 (six) hours as needed (for pain).   Yes [provider]  Liraglutide (VICTOZA) 18 MG/3ML SOPN Inject 1.2 mg into the skin every evening.    Yes [provider]  ondansetron (ZOFRAN ODT) 4 MG disintegrating tablet Take 1 tablet (4 mg total) by mouth every 8 (eight) hours as needed for nausea or vomiting. 04/01/16  Yes Ward, Cyril Mourning N, DO  pantoprazole (PROTONIX) 40 MG tablet Take 40 mg by mouth daily.  04/14/12  Yes [provider]  PROAIR HFA 108 (90 Base) MCG/ACT inhaler INHALE 2 PUFFS INTO THE LUNGS EVERY FOUR HOURS AS NEEDED FOR WHEEZING. 11/21/15  Yes Tanda Rockers, MD  rOPINIRole (REQUIP) 4 MG tablet Take 4 mg by mouth 2 (two) times daily. MIDDAY AND BEDTIME   Yes [provider]  rosuvastatin (CRESTOR) 10 MG tablet Take 10 mg by mouth once a week.  03/25/15  Yes [provider]  Simethicone (GAS-X ULTRA STRENGTH) 180 MG CAPS Take 2 capsules by mouth daily as needed (for gas).   Yes [provider]  TRESIBA FLEXTOUCH 100 UNIT/ML SOPN Inject 49 Units into the skin at bedtime. Max of 60 units/day 06/19/15  Yes [provider]  UNABLE TO FIND CPAP at bedtime   Yes [provider]  BAYER CONTOUR NEXT TEST test strip As directed 07/30/15   [provider]  furosemide (LASIX) 20 MG tablet Take 1 tablet (20 mg total) by mouth 2 (two) times daily. 09/12/16   Tanna Furry, MD  GLOBAL EASE INJECT PEN NEEDLES 32G X 4 MM MISC  06/19/15   [provider]  guaiFENesin-codeine 100-10 MG/5ML syrup Take 10 mLs by mouth every 6 (six) hours as needed for cough. Patient not taking: Reported on 09/12/2016 04/01/16   Ward, Delice Bison, DO  metolazone (ZAROXOLYN) 5 MG tablet Take 1 tablet (5 mg total) by mouth daily. 09/12/16   Tanna Furry, MD  MICROLET LANCETS MISC As directed 07/30/15   [provider]  potassium chloride SA (K-DUR,KLOR-CON) 20 MEQ tablet Take 3 tablets (60 mEq total) by mouth 2 (two) times daily. 09/12/16   Tanna Furry, MD  predniSONE (STERAPRED UNI-PAK 21 TAB) 10 MG (21) TBPK tablet Take 6-5-4-3-2-1 PO orally till gone Patient not taking: Reported on 09/12/2016 04/09/16   Verlee Monte, MD  torsemide (DEMADEX) 20 MG tablet TAKE 3 TABLETS DAILY Patient not taking: Reported on 09/12/2016 12/24/14   [name], MD  traMADol (ULTRAM) 50 MG tablet Take 1.5-2 tablets (75-100 mg total) by mouth every 6 (six) hours as needed for severe pain. Patient not taking: Reported on 09/12/2016 11/15/15   [name], MD    Family History Family History  Problem Relation Age of Onset  . Heart disease Mother   . Hypertension Mother   . Dementia Mother   . Heart disease Father   . COPD Father   . Hypertension Father     Social History Social History  Substance Use Topics  . Smoking status: Never Smoker  . Smokeless tobacco: Never  Used  . Alcohol use No     Allergies   Almond oil; Morphine and related; Ceclor [cefaclor]; Ciprofloxacin; Levaquin [levofloxacin]; and Sulfa antibiotics   Review of Systems Review  of Systems  Constitutional: Positive for unexpected weight change. Negative for appetite change, chills, diaphoresis, fatigue and fever.  HENT: Negative for mouth sores, sore throat and trouble swallowing.   Eyes: Negative for visual disturbance.  Respiratory: Positive for shortness of breath. Negative for cough, chest tightness and wheezing.   Cardiovascular: Positive for chest pain.  Gastrointestinal: Negative for abdominal distention, abdominal pain, diarrhea, nausea and vomiting.  Endocrine: Negative for polydipsia, polyphagia and polyuria.  Genitourinary: Negative for dysuria, frequency and hematuria.  Musculoskeletal: Negative for gait problem.       Right shoulder pain  Skin: Negative for color change, pallor and rash.  Neurological: Negative for dizziness, syncope, light-headedness and headaches.  Hematological: Does not bruise/bleed easily.  Psychiatric/Behavioral: Negative for behavioral problems and confusion.     Physical Exam Updated Vital Signs BP 132/71   Pulse 80   Temp 98.6 F (37 C) (Oral)   Resp 18   Ht 5' 5.25" (1.657 m)   Wt 122.5 kg (270 lb)   SpO2 96%   BMI 44.59 kg/m   Physical Exam  Constitutional: She is oriented to person, place, and time. No distress.  Morbid obesity. Sitting upright. Not dyspneic with conversation.  HENT:  Head: Normocephalic.  Eyes: Conjunctivae are normal. Pupils are equal, round, and reactive to light. No scleral icterus.  Neck: Normal range of motion. Neck supple. No thyromegaly present.  Cardiovascular: Normal rate and regular rhythm.  Exam reveals no gallop and no friction rub.   No murmur heard. Pulmonary/Chest: Effort normal and breath sounds normal. No respiratory distress. She has no wheezes. She has no rales.  Clear bilateral breath  sounds. No wheezing rales or rhonchi. No increased work of breathing. No peripheral edema.  Abdominal: Soft. Bowel sounds are normal. She exhibits no distension. There is no tenderness. There is no rebound.  Musculoskeletal: Normal range of motion.  Neurological: She is alert and oriented to person, place, and time.  Skin: Skin is warm and dry. No rash noted.  Psychiatric: She has a normal mood and affect. Her behavior is normal.     ED Treatments / Results  Labs (all labs ordered are listed, but only abnormal results are displayed) Labs Reviewed  BASIC METABOLIC PANEL - Abnormal; Notable for the following:       Result Value   Sodium 134 (*)    Potassium 2.7 (*)    Chloride 90 (*)    Glucose, Bld 200 (*)    Creatinine, Ser 1.12 (*)    GFR calc non Af Amer 52 (*)    All other components within normal limits  CBC - Abnormal; Notable for the following:    Hemoglobin 11.8 (*)    RDW 16.2 (*)    All other components within normal limits  BRAIN NATRIURETIC PEPTIDE  I-STAT TROPOININ, ED  I-STAT TROPOININ, ED    EKG  EKG Interpretation  Date/Time:  Saturday September 12 2016 13:29:23 EDT Ventricular Rate:  84 PR Interval:  182 QRS Duration: 108 QT Interval:  440 QTC Calculation: 519 R Axis:   -67 Text Interpretation:  Normal sinus rhythm Left axis deviation Low voltage QRS Right bundle branch block Cannot rule out Anterior infarct , age undetermined Abnormal ECG Confirmed by Tanna Furry [id number]) on 09/12/2016 2:52:20 PM       Radiology Dg Chest 2 View  Result Date: 09/12/2016 CLINICAL DATA:  SOB, tingling down right side, tightness in chest. Hx of CHF, asthma,hypertension, hyperlipidemia, pneumonia. EXAM: CHEST  2 VIEW  COMPARISON:  04/05/2016 FINDINGS: Lateral view degraded by patient arm position. Numerous leads and wires project over the chest. Midline trachea. Mild cardiomegaly. Mediastinal contours otherwise within normal limits. No pleural effusion or pneumothorax. Biapical  pleural thickening. Clear lungs. IMPRESSION: Mild cardiomegaly, without acute disease. Electronically Signed   By: Abigail Miyamoto M.D.   On: 09/12/2016 15:28    Procedures Procedures (including critical care time)  Medications Ordered in ED Medications  furosemide (LASIX) injection 80 mg (80 mg Intravenous Given 09/12/16 1545)  potassium chloride SA (K-DUR,KLOR-CON) CR tablet 40 mEq (40 mEq Oral Given 09/12/16 1629)  potassium chloride 10 mEq in 100 mL IVPB (10 mEq Intravenous New Bag/Given 09/12/16 1823)  aspirin chewable tablet 162 mg (162 mg Oral Given 09/12/16 1629)     Initial Impression / Assessment and Plan / ED Course  I have reviewed the triage vital signs and the nursing notes.  Pertinent labs & imaging results that were available during my care of the patient were reviewed by me and considered in my medical decision making (see chart for details).   increased dyspnea and increased weight gain. Patient with chronic dyspnea that is multifactorial secondary to obstructive sleep apnea, reactive airways disease, CHF, and deconditioning.  Atypical chest pain. Normal first troponin. EKG unchanged. Has bundle branch block that is similar in morphology. Plan diuresis, x-ray, serial troponin, reevaluation for improvement in symptoms.   Final Clinical Impressions(s) / ED Diagnoses   Final diagnoses:  Chest pain, unspecified type  Congestive heart failure, unspecified HF chronicity, unspecified heart failure type ([place])  Hypokalemia    Patient with symptom relief after diuresis, potassium replacement. Serial enzymes negative. Discussion, patient states that she has had episodes of discomfort and pain before her potassium has been low. She is also fluid positive. We'll increase Lasix, Zaroxolyn, and potassium for the next 7 days. Her physician is Dr. Nicki Reaper, and Tia Alert. She has appointment Friday. Have asked her to call to arrange for lab draw on Friday with her primary care physician. Instructed  to follow new doses of Lasix, potassium, and Zaroxolyn until seen by primary care physician.  New Prescriptions New Prescriptions   FUROSEMIDE (LASIX) 20 MG TABLET    Take 1 tablet (20 mg total) by mouth 2 (two) times daily.   METOLAZONE (ZAROXOLYN) 5 MG TABLET    Take 1 tablet (5 mg total) by mouth daily.   POTASSIUM CHLORIDE SA (K-DUR,KLOR-CON) 20 MEQ TABLET    Take 3 tablets (60 mEq total) by mouth 2 (two) times daily.     Tanna Furry, MD 09/12/16 2006

## 2016-09-12 NOTE — ED Notes (Signed)
EDP aware of critical lab

## 2016-09-12 NOTE — ED Notes (Signed)
Pt and family understood dc material. NAD noted. Scripts given at [employer] [employer]

## 2016-09-12 NOTE — ED Notes (Signed)
Patient transported to X-ray 

## 2016-09-12 NOTE — ED Triage Notes (Signed)
Pt to ED from home c/o R sided CP radiating to back since 1200 today, onset while sitting down. Endorses SOB, worsening CP with deep inspiration, and nausea. Denies dizziness/lightheadedness. Pt also reports indigestion-like pain, but there was a new stinging pain that concerns her. Hx CHF - reports 10lb increase in 5 days, takes furosemide daily. Pt took 186m ASA PTA with some relief. Resp e/u, skin warm/dry. Denies fevers/chills.

## 2016-09-13 ENCOUNTER — Telehealth: Payer: Self-pay | Admitting: *Deleted

## 2016-09-13 NOTE — Telephone Encounter (Signed)
Spoke with Pharmacy to make sure she reiterates with the pt the recommendations that she have her serum K+ rechecked at her PCP office on Friday 09/18/2016. No further CM needs at this time.

## 2016-09-18 DIAGNOSIS — Z6841 Body Mass Index (BMI) 40.0 and over, adult: Secondary | ICD-10-CM | POA: Diagnosis not present

## 2016-09-18 DIAGNOSIS — Z0000 Encounter for general adult medical examination without abnormal findings: Secondary | ICD-10-CM | POA: Diagnosis not present

## 2016-09-18 DIAGNOSIS — E1165 Type 2 diabetes mellitus with hyperglycemia: Secondary | ICD-10-CM | POA: Diagnosis not present

## 2016-09-21 DIAGNOSIS — E1165 Type 2 diabetes mellitus with hyperglycemia: Secondary | ICD-10-CM | POA: Diagnosis not present

## 2016-09-23 DIAGNOSIS — E876 Hypokalemia: Secondary | ICD-10-CM | POA: Diagnosis not present

## 2016-09-28 DIAGNOSIS — E876 Hypokalemia: Secondary | ICD-10-CM | POA: Diagnosis not present

## 2016-10-02 DIAGNOSIS — G4733 Obstructive sleep apnea (adult) (pediatric): Secondary | ICD-10-CM | POA: Diagnosis not present

## 2016-10-16 DIAGNOSIS — E876 Hypokalemia: Secondary | ICD-10-CM | POA: Diagnosis not present

## 2016-10-23 DIAGNOSIS — E1165 Type 2 diabetes mellitus with hyperglycemia: Secondary | ICD-10-CM | POA: Diagnosis not present

## 2016-11-02 DIAGNOSIS — G4733 Obstructive sleep apnea (adult) (pediatric): Secondary | ICD-10-CM | POA: Diagnosis not present

## 2016-12-03 DIAGNOSIS — G4733 Obstructive sleep apnea (adult) (pediatric): Secondary | ICD-10-CM | POA: Diagnosis not present

## 2016-12-08 DIAGNOSIS — R69 Illness, unspecified: Secondary | ICD-10-CM | POA: Diagnosis not present

## 2016-12-14 DIAGNOSIS — R69 Illness, unspecified: Secondary | ICD-10-CM | POA: Diagnosis not present

## 2017-01-03 DIAGNOSIS — G4733 Obstructive sleep apnea (adult) (pediatric): Secondary | ICD-10-CM | POA: Diagnosis not present

## 2017-01-13 DIAGNOSIS — E1129 Type 2 diabetes mellitus with other diabetic kidney complication: Secondary | ICD-10-CM | POA: Diagnosis not present

## 2017-01-19 DIAGNOSIS — E1149 Type 2 diabetes mellitus with other diabetic neurological complication: Secondary | ICD-10-CM | POA: Diagnosis not present

## 2017-01-19 DIAGNOSIS — E1165 Type 2 diabetes mellitus with hyperglycemia: Secondary | ICD-10-CM | POA: Diagnosis not present

## 2017-01-19 DIAGNOSIS — Z1339 Encounter for screening examination for other mental health and behavioral disorders: Secondary | ICD-10-CM | POA: Diagnosis not present

## 2017-01-19 DIAGNOSIS — Z1331 Encounter for screening for depression: Secondary | ICD-10-CM | POA: Diagnosis not present

## 2017-01-19 DIAGNOSIS — Z6841 Body Mass Index (BMI) 40.0 and over, adult: Secondary | ICD-10-CM | POA: Diagnosis not present

## 2017-01-19 DIAGNOSIS — Z23 Encounter for immunization: Secondary | ICD-10-CM | POA: Diagnosis not present

## 2017-01-20 DIAGNOSIS — M533 Sacrococcygeal disorders, not elsewhere classified: Secondary | ICD-10-CM | POA: Diagnosis not present

## 2017-01-20 DIAGNOSIS — M5136 Other intervertebral disc degeneration, lumbar region: Secondary | ICD-10-CM | POA: Diagnosis not present

## 2017-01-27 DIAGNOSIS — M533 Sacrococcygeal disorders, not elsewhere classified: Secondary | ICD-10-CM | POA: Diagnosis not present

## 2017-02-02 DIAGNOSIS — E876 Hypokalemia: Secondary | ICD-10-CM | POA: Diagnosis not present

## 2017-02-03 DIAGNOSIS — G4733 Obstructive sleep apnea (adult) (pediatric): Secondary | ICD-10-CM | POA: Diagnosis not present

## 2017-02-17 ENCOUNTER — Ambulatory Visit: Payer: Medicare HMO | Admitting: Pulmonary Disease

## 2017-02-17 ENCOUNTER — Encounter: Payer: Self-pay | Admitting: Pulmonary Disease

## 2017-02-17 VITALS — BP 128/80 | HR 90 | Ht 65.0 in | Wt 270.0 lb

## 2017-02-17 DIAGNOSIS — G4733 Obstructive sleep apnea (adult) (pediatric): Secondary | ICD-10-CM | POA: Diagnosis not present

## 2017-02-17 DIAGNOSIS — Z6841 Body Mass Index (BMI) 40.0 and over, adult: Secondary | ICD-10-CM

## 2017-02-17 DIAGNOSIS — R0609 Other forms of dyspnea: Secondary | ICD-10-CM | POA: Diagnosis not present

## 2017-02-17 DIAGNOSIS — J449 Chronic obstructive pulmonary disease, unspecified: Secondary | ICD-10-CM | POA: Diagnosis not present

## 2017-02-17 DIAGNOSIS — Z23 Encounter for immunization: Secondary | ICD-10-CM

## 2017-02-17 DIAGNOSIS — R0600 Dyspnea, unspecified: Secondary | ICD-10-CM

## 2017-02-17 MED ORDER — UMECLIDINIUM BROMIDE 62.5 MCG/INH IN AEPB
1.0000 | INHALATION_SPRAY | Freq: Every day | RESPIRATORY_TRACT | 5 refills | Status: DC
Start: 2017-02-17 — End: 2017-05-06

## 2017-02-17 NOTE — Patient Instructions (Signed)
Pneumonia 13 shot today Incruse 1 puff daily Will call with results of CPAP download Will arrange for overnight oxygen test with CPAP Will arrange for CPAP mask refitting Will refer to pulmonary rehab  Follow up in 2 months

## 2017-02-17 NOTE — Progress Notes (Signed)
   Subjective:    Patient ID: Kathleen Hatfield, female    DOB: 10-02-1955, 61 y.o.   MRN: [id number]  HPI    Review of Systems  Constitutional: Negative for fever and unexpected weight change.  HENT: Positive for congestion. Negative for dental problem, ear pain, nosebleeds, postnasal drip, rhinorrhea, sinus pressure, sneezing, sore throat and trouble swallowing.   Eyes: Negative for redness and itching.  Respiratory: Positive for cough and shortness of breath. Negative for chest tightness and wheezing.   Cardiovascular: Negative for palpitations and leg swelling.  Gastrointestinal: Negative for nausea and vomiting.  Genitourinary: Negative for dysuria.  Musculoskeletal: Negative for joint swelling.  Skin: Negative for rash.  Neurological: Negative for headaches.  Hematological: Does not bruise/bleed easily.  Psychiatric/Behavioral: Negative for dysphoric mood. The patient is not nervous/anxious.        Objective:   Physical Exam        Assessment & Plan:

## 2017-02-17 NOTE — Progress Notes (Signed)
[facility]  Chief Complaint  Patient presents with  . sleep consult    former AD pt- currently on cpap 13cm. pt states she wears cpap avg 4hr nightly, feels pressure should be stronger.     History of Present Illness: Kathleen Hatfield is a 61 y.o. 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  She was seen previously by Dr. Corrie Dandy.    She continues to have trouble with her breathing.  She is not very active.  Walking 1/2 block and she gets winded.  She gets occasional wheeze and cough.  She is not having skin rash, fever, or hemoptysis.  She was getting leg swelling, but this is better.  She gets cramps in her legs at night.    She uses CPAP nightly.  She likes sleeping on her side, but her mask shifts.  She uses a full face mask.  She isn't sure if the pressure is enough.  Past Medical History: She  has a past medical history of Anemia, Arthritis, Asthma, CHF (congestive heart failure) ([place]) (05/2014), Chronic diastolic heart failure ([place]), CKD (chronic kidney disease), Depression, Diabetes mellitus without complication ([place]), Diverticulitis, Dyspnea, GERD (gastroesophageal reflux disease), History of blood transfusion, Hyperlipidemia, Hypertension, Peripheral neuropathy, Pneumonia, PONV (postoperative nausea and vomiting), Sleep apnea, and Ulcerative colitis ([place]).  Vital Signs: BP 128/80 (BP Location: Left Arm, Cuff Size: Normal)   Pulse 90   Ht 5' 5"  (1.651 m)   Wt 270 lb (122.5 kg)   SpO2 95%   BMI 44.93 kg/m   Physical Exam:  General - pleasant Eyes - pupils reactive ENT - no sinus tenderness, no oral exudate, no LAN Cardiac - regular, no murmur Chest - no wheeze, rales Abd - soft, non tender Ext - no edema Skin - no rashes Neuro - normal strength Psych - normal mood  Ambulatory oximetry on room air today was okay  CMP Latest Ref Rng & Units 09/12/2016 04/09/2016 04/08/2016  Glucose 65 - 99 mg/dL 200(H) 310(H) 297(H)  BUN 6 -  20 mg/dL 14 37(H) 31(H)  Creatinine 0.44 - 1.00 mg/dL 1.12(H) 1.38(H) 1.25(H)  Sodium 135 - 145 mmol/L 134(L) 136 135  Potassium 3.5 - 5.1 mmol/L 2.7(LL) 3.8 4.0  Chloride 101 - 111 mmol/L 90(L) 87(L) 89(L)  CO2 22 - 32 mmol/L 30 32 34(H)  Calcium 8.9 - 10.3 mg/dL 9.6 10.0 9.3  Total Protein 6.5 - 8.1 g/dL - - -  Total Bilirubin 0.3 - 1.2 mg/dL - - -  Alkaline Phos 38 - 126 U/L - - -  AST 15 - 41 U/L - - -  ALT 14 - 54 U/L - - -   CBC Latest Ref Rng & Units 09/12/2016 04/09/2016 04/08/2016  WBC 4.0 - 10.5 K/uL 10.0 22.5(H) 20.5(H)  Hemoglobin 12.0 - 15.0 g/dL 11.8(L) 14.7 13.1  Hematocrit 36.0 - 46.0 % 36.9 46.5(H) 41.6  Platelets 150 - 400 K/uL 342 519(H) 454(H)    ABG    Component Value Date/Time   PHART 7.456 (H) 04/03/2016 1215   PCO2ART 45.2 04/03/2016 1215   PO2ART 60.9 (L) 04/03/2016 1215   HCO3 31.4 (H) 04/03/2016 1215   TCO2 25.1 10/22/2015 0935   O2SAT 90.6 04/03/2016 1215     Discussion: She has persistent symptoms of dyspnea.  She has COPD with asthma, and diastolic dysfunction.  She also likely has component of deconditioning.  She has history of sleep apnea.  She continues to have trouble with  her sleep.  Assessment/Plan:  COPD with asthma. - will add incruse - continue advair and prn albuterol - will arrange for pulmonary rehab referral - prevnar shot today  Obstructive sleep apnea. - will get copy of her CPAP download - will arrange for ONO with CPAP  Obesity with deconditioning. - discussed importance of weight loss and maintaining a regular exercise regimen   Patient Instructions  Pneumonia 13 shot today Incruse 1 puff daily Will call with results of CPAP download Will arrange for overnight oxygen test with CPAP Will arrange for CPAP mask refitting Will refer to pulmonary rehab  Follow up in 2 months    [name], MD [place] 02/17/2017, 4:07 PM Pager:  [phone]  Flow Sheet  Pulmonary tests: CT angio  chest 02/10/13 >> small airway disease PFT 07/02/15 >> FEV1 1.50 (56%), FEV1% 69, TLC 4.99 (96%), DLCO 60%, +BD V/Q 04/05/16 >> normal  Sleep tests: PSG 09/01/12 >> AHI 36.1  Cardiac tests: Echo 04/03/16 >> EF 65 to 70%, mild LVH, grade 1 DD  Events:  Review of Systems: Constitutional: Negative for fever and unexpected weight change.  HENT: Positive for congestion. Negative for dental problem, ear pain, nosebleeds, postnasal drip, rhinorrhea, sinus pressure, sneezing, sore throat and trouble swallowing.   Eyes: Negative for redness and itching.  Respiratory: Positive for cough and shortness of breath. Negative for chest tightness and wheezing.   Cardiovascular: Negative for palpitations and leg swelling.  Gastrointestinal: Negative for nausea and vomiting.  Genitourinary: Negative for dysuria.  Musculoskeletal: Negative for joint swelling.  Skin: Negative for rash.  Neurological: Negative for headaches.  Hematological: Does not bruise/bleed easily.  Psychiatric/Behavioral: Negative for dysphoric mood. The patient is not nervous/anxious.    Past Surgical History: She  has a past surgical history that includes Abdominal hysterectomy; Cholecystectomy; Cesarean section; Appendectomy; Sigmoidectomy (2010); right heart catheterization (N/A, 02/27/2013); right heart catheterization (N/A, 12/14/2013); Cardiac catheterization; Breast surgery; Hernia repair; Ventral hernia repair (N/A, 11/15/2015); Laparoscopic lysis of adhesions (N/A, 11/15/2015); and Insertion of mesh (N/A, 11/15/2015).  Family History: Her family history includes COPD in her father; Dementia in her mother; Heart disease in her father and mother; Hypertension in her father and mother.  Social History: She  reports that  has never smoked. she has never used smokeless tobacco. She reports that she does not drink alcohol or use drugs.  Medications: Allergies as of 02/17/2017      Reactions   Almond Oil Anaphylaxis   Morphine And  Related Shortness Of Breath, Other (See Comments)   Pt. States while in the hospital it affected her breathing, O2 dropped to the 70's   Ceclor [cefaclor] Nausea And Vomiting   Ciprofloxacin Other (See Comments)   Makes joints and muscles ache   Levaquin [levofloxacin] Other (See Comments)   Body aches   Sulfa Antibiotics Nausea And Vomiting      Medication List        Accurate as of 02/17/17  4:07 PM. Always use your most recent med list.          acetaminophen 500 MG tablet Commonly known as:  TYLENOL Take 500 mg by mouth every 4 (four) hours as needed (pain).   ADVAIR HFA 115-21 MCG/ACT inhaler Generic drug:  fluticasone-salmeterol INHALE 2 PUFFS INTO THE LUNGS 2 TIMES DAILY.   albuterol (2.5 MG/3ML) 0.083% nebulizer solution Commonly known as:  PROVENTIL Take 3 mLs (2.5 mg total) by nebulization every 4 (four) hours as needed for wheezing.   PROAIR  HFA 108 (90 Base) MCG/ACT inhaler Generic drug:  albuterol INHALE 2 PUFFS INTO THE LUNGS EVERY FOUR HOURS AS NEEDED FOR WHEEZING.   baclofen 5 mg Tabs tablet Commonly known as:  LIORESAL Take 2.5 mg by mouth 2 (two) times daily as needed for muscle spasms.   BAYER CONTOUR NEXT TEST test strip Generic drug:  glucose blood As directed   DULoxetine 60 MG capsule Commonly known as:  CYMBALTA Take 60 mg by mouth 2 (two) times daily.   estradiol 2 MG tablet Commonly known as:  ESTRACE Take 1 tablet (2 mg total) by mouth daily.   fenofibrate 145 MG tablet Commonly known as:  TRICOR Take 145 mg by mouth at bedtime.   folic acid 1 MG tablet Commonly known as:  FOLVITE Take 1 mg by mouth 2 (two) times daily.   furosemide 20 MG tablet Commonly known as:  LASIX Take 1 tablet (20 mg total) by mouth 2 (two) times daily.   gabapentin 100 MG capsule Commonly known as:  NEURONTIN Take 400 mg by mouth at bedtime.   GAS-X ULTRA STRENGTH 180 MG Caps Generic drug:  Simethicone Take 2 capsules by mouth daily as needed (for  gas).   GAVISCON PO Take 2 tablets by mouth at bedtime as needed (reflux).   GLOBAL EASE INJECT PEN NEEDLES 32G X 4 MM Misc Generic drug:  Insulin Pen Needle   guaiFENesin-codeine 100-10 MG/5ML syrup Take 10 mLs by mouth every 6 (six) hours as needed for cough.   ibuprofen 200 MG tablet Commonly known as:  ADVIL,MOTRIN Take 200 mg by mouth every 6 (six) hours as needed (for pain).   INVOKANA 100 MG Tabs tablet Generic drug:  canagliflozin   metolazone 5 MG tablet Commonly known as:  ZAROXOLYN Take 1 tablet (5 mg total) by mouth daily.   MICROLET LANCETS Misc As directed   ondansetron 4 MG disintegrating tablet Commonly known as:  ZOFRAN ODT Take 1 tablet (4 mg total) by mouth every 8 (eight) hours as needed for nausea or vomiting.   pantoprazole 40 MG tablet Commonly known as:  PROTONIX Take 40 mg by mouth daily.   potassium chloride 10 MEQ tablet Commonly known as:  K-DUR Take by mouth.   rOPINIRole 4 MG tablet Commonly known as:  REQUIP Take 4 mg by mouth 2 (two) times daily. MIDDAY AND BEDTIME   rosuvastatin 10 MG tablet Commonly known as:  CRESTOR Take 10 mg by mouth once a week.   torsemide 20 MG tablet Commonly known as:  DEMADEX TAKE 3 TABLETS DAILY   TOUJEO SOLOSTAR 300 UNIT/ML Sopn Generic drug:  Insulin Glargine   traMADol 50 MG tablet Commonly known as:  ULTRAM Take 1.5-2 tablets (75-100 mg total) by mouth every 6 (six) hours as needed for severe pain.   TRESIBA FLEXTOUCH 100 UNIT/ML Sopn FlexTouch Pen Generic drug:  insulin degludec Inject 49 Units into the skin at bedtime. Max of 60 units/day   umeclidinium bromide 62.5 MCG/INH Aepb Commonly known as:  INCRUSE ELLIPTA Inhale 1 puff into the lungs daily.   UNABLE TO FIND CPAP at bedtime   VICTOZA 18 MG/3ML Sopn Generic drug:  liraglutide Inject 1.2 mg into the skin every evening.   Vitamin D-3 5000 units Tabs Take 5,000 Units by mouth daily.

## 2017-02-19 ENCOUNTER — Telehealth: Payer: Self-pay | Admitting: Pulmonary Disease

## 2017-02-19 NOTE — Telephone Encounter (Signed)
Download has been obtained. Will place on Dr. Juanetta Gosling desk for him to review.

## 2017-02-22 ENCOUNTER — Telehealth (HOSPITAL_COMMUNITY): Payer: Self-pay

## 2017-02-22 ENCOUNTER — Encounter: Payer: Self-pay | Admitting: Cardiovascular Disease

## 2017-02-22 ENCOUNTER — Ambulatory Visit: Payer: Medicare HMO | Admitting: Cardiovascular Disease

## 2017-02-22 ENCOUNTER — Encounter (INDEPENDENT_AMBULATORY_CARE_PROVIDER_SITE_OTHER): Payer: Self-pay

## 2017-02-22 VITALS — BP 130/80 | HR 81 | Ht 65.0 in | Wt 275.0 lb

## 2017-02-22 DIAGNOSIS — I5032 Chronic diastolic (congestive) heart failure: Secondary | ICD-10-CM | POA: Diagnosis not present

## 2017-02-22 NOTE — Progress Notes (Signed)
Cardiology Office Note Date:  02/22/2017   ID:  GERMAINE Hatfield, DOB [birth year], MRN [id number]  PCP:  Myer Peer, MD  Cardiologist:  [name], MD    Chief Complaint  Patient presents with  . Congestive Heart Failure     History of Present Illness: Kathleen Hatfield is a 61 y.o. female who presents for follow-up evaluation.   She was last seen here in the office in March 2016.  She has been followed for chronic diastolic heart failure with comorbidities including morbid obesity, obstructive sleep apnea, and chronic lung disease/asthma.  She's had a rough year. Hospitalized in January with respiratory failure and influenza. It has taken her a long time to recover from this. She required BiPap but did not progress to need mechanical ventilation. She's also had a lot of problems with hypokalemia. Was taking torsemide 60 mg daily. Notes she had lightheadedness and recurrent syncope throughout the year. Her medications have been adjusted and she is now managed with aldactone 25 mg daily and furosemide 10 mg daily (20 mg if weight increased). Seems to be feeling much better with this and reports stable weight at home.  She continues to have DOE with low level activity. No orthopnea or PND. Complains of tightness in her chest with lifting/carrying.    Past Medical History:  Diagnosis Date  . Anemia   . Arthritis   . Asthma   . CHF (congestive heart failure) ([place]) 05/2014  . Chronic diastolic heart failure ([place])   . CKD (chronic kidney disease)   . Depression   . Diabetes mellitus without complication ([place])   . Diverticulitis   . Dyspnea    with exertion  . GERD (gastroesophageal reflux disease)   . History of blood transfusion   . Hyperlipidemia    cannot tolerate statins  . Hypertension   . Peripheral neuropathy   . Pneumonia   . PONV (postoperative nausea and vomiting)   . Sleep apnea    uses CPAP  . Ulcerative colitis ([place])    dr Collene Mares    Past Surgical History:    Procedure Laterality Date  . ABDOMINAL HYSTERECTOMY    . APPENDECTOMY    . BREAST SURGERY     left biopsy  . CARDIAC CATHETERIZATION    . CESAREAN SECTION    . CHOLECYSTECTOMY    . HERNIA REPAIR    . INSERTION OF MESH N/A 11/15/2015   Procedure: INSERTION OF MESH;  Surgeon: Munachimso Palin Boston, MD;  Location: [facility];  Service: General;  Laterality: N/A;  . LAPAROSCOPIC LYSIS OF ADHESIONS N/A 11/15/2015   Procedure: LAPAROSCOPIC LYSIS OF ADHESIONS;  Surgeon: Mary Secord Boston, MD;  Location: [facility];  Service: General;  Laterality: N/A;  . RIGHT HEART CATHETERIZATION N/A 02/27/2013   Procedure: RIGHT HEART CATH;  Surgeon: Blane Ohara, MD;  Location: [facility] CATH LAB;  Service: Cardiovascular;  Laterality: N/A;  . RIGHT HEART CATHETERIZATION N/A 12/14/2013   Procedure: RIGHT HEART CATH;  Surgeon: Larey Dresser, MD;  Location[facility] CATH LAB;  Service: Cardiovascular;  Laterality: N/A;  . SIGMOIDECTOMY  2010   diverticular disease  . VENTRAL HERNIA REPAIR N/A 11/15/2015   Procedure: LAPAROSCOPIC VENTRAL WALL HERNIA REPAIR;  Surgeon: August Longest Boston, MD;  Location: [facility] ORS;  Service: General;  Laterality: N/A;    Current Outpatient Medications  Medication Sig Dispense Refill  . acetaminophen (TYLENOL) 500 MG tablet Take 500 mg by mouth every 4 (four) hours as needed (pain).     Marland Kitchen  albuterol (PROVENTIL) (2.5 MG/3ML) 0.083% nebulizer solution Take 3 mLs (2.5 mg total) by nebulization every 4 (four) hours as needed for wheezing. 180 mL 2  . Alum Hydroxide-Mag Carbonate (GAVISCON PO) Take 2 tablets by mouth at bedtime as needed (reflux).     . baclofen (LIORESAL) 5 mg TABS tablet Take 2.5 mg by mouth 2 (two) times daily as needed for muscle spasms.     Marland Kitchen BAYER CONTOUR NEXT TEST test strip As directed    . Cholecalciferol (VITAMIN D-3) 5000 UNITS TABS Take 5,000 Units by mouth daily.     . DULoxetine (CYMBALTA) 60 MG capsule Take 60 mg by mouth 2 (two) times daily.     Marland Kitchen estradiol (ESTRACE) 2 MG tablet Take 1 tablet  (2 mg total) by mouth daily. 90 tablet 3  . fenofibrate (TRICOR) 145 MG tablet Take 145 mg by mouth at bedtime.   11  . folic acid (FOLVITE) 1 MG tablet Take 1 mg by mouth 2 (two) times daily.    . furosemide (LASIX) 20 MG tablet Take 10 mg by mouth daily. Take a whole tablet (41m) by mouth once daily if needed for increased swelling    . gabapentin (NEURONTIN) 100 MG capsule Take 400 mg by mouth at bedtime.     .Marland KitchenGLOBAL EASE INJECT PEN NEEDLES 32G X 4 MM MISC     . ibuprofen (ADVIL,MOTRIN) 200 MG tablet Take 200 mg by mouth every 6 (six) hours as needed (for pain).    . INVOKANA 100 MG TABS tablet Take 100 mg by mouth daily.     . Liraglutide (VICTOZA) 18 MG/3ML SOPN Inject 1.2 mg into the skin every evening.     .Marland KitchenMICROLET LANCETS MISC As directed    . ondansetron (ZOFRAN ODT) 4 MG disintegrating tablet Take 1 tablet (4 mg total) by mouth every 8 (eight) hours as needed for nausea or vomiting. 20 tablet 0  . pantoprazole (PROTONIX) 40 MG tablet Take 40 mg by mouth daily.     .Marland KitchenPROAIR HFA 108 (90 Base) MCG/ACT inhaler INHALE 2 PUFFS INTO THE LUNGS EVERY FOUR HOURS AS NEEDED FOR WHEEZING. 8.5 g 5  . rOPINIRole (REQUIP) 4 MG tablet Take 4 mg by mouth 2 (two) times daily. MIDDAY AND BEDTIME    . rosuvastatin (CRESTOR) 10 MG tablet Take 10 mg by mouth once a week.     . Simethicone (GAS-X ULTRA STRENGTH) 180 MG CAPS Take 2 capsules by mouth daily as needed (for gas).    .Marland Kitchenspironolactone (ALDACTONE) 25 MG tablet Take 25 mg by mouth daily.    .Nelva NaySOLOSTAR 300 UNIT/ML SOPN Inject 70 Units into the skin daily.     . traMADol (ULTRAM) 50 MG tablet Take 1.5-2 tablets (75-100 mg total) by mouth every 6 (six) hours as needed for severe pain. 50 tablet 0  . umeclidinium bromide (INCRUSE ELLIPTA) 62.5 MCG/INH AEPB Inhale 1 puff into the lungs daily. 1 each 5  . UNABLE TO FIND CPAP at bedtime     No current facility-administered medications for this visit.     Allergies:   Almond oil; Morphine and  related; Ceclor [cefaclor]; Ciprofloxacin; Levaquin [levofloxacin]; and Sulfa antibiotics   Social History:  The patient  reports that  has never smoked. she has never used smokeless tobacco. She reports that she does not drink alcohol or use drugs.   Family History:  The patient's family history includes COPD in her father; Dementia in her mother; Heart  disease in her father and mother; Hypertension in her father and mother.    ROS:  Please see the history of present illness.  Otherwise, review of systems is positive for   All other systems are reviewed and negative.   PHYSICAL EXAM: VS:  BP 130/80   Pulse 81   Ht 5' 5"  (1.651 m)   Wt 275 lb (124.7 kg)   BMI 45.76 kg/m  , BMI Body mass index is 45.76 kg/m. GEN: pleasant obese woman, in no acute distress  HEENT: normal  Neck: no JVD, no masses. No carotid bruits Cardiac: RRR without murmur or gallop     Respiratory:  clear to auscultation bilaterally, normal work of breathing GI: soft, nontender, nondistended, + BS MS: no deformity or atrophy  Ext: no pretibial edema, pedal pulses 2+= bilaterally Skin: warm and dry, no rash Neuro:  Strength and sensation are intact Psych: euthymic mood, full affect  EKG:  EKG is ordered today.  Recent Labs: 03/31/2016: ALT 50 04/03/2016: B Natriuretic Peptide 51.4 09/12/2016: BUN 14; Creatinine, Ser 1.12; Hemoglobin 11.8; Platelets 342; Potassium 2.7; Sodium 134   Lipid Panel     Component Value Date/Time   CHOL 236 (H) 03/25/2013 0500   TRIG 345 (H) 03/25/2013 0500   HDL 34 (L) 03/25/2013 0500   CHOLHDL 6.9 03/25/2013 0500   VLDL 69 (H) 03/25/2013 0500   LDLCALC 133 (H) 03/25/2013 0500      Wt Readings from Last 3 Encounters:  02/22/17 275 lb (124.7 kg)  02/17/17 270 lb (122.5 kg)  09/12/16 270 lb (122.5 kg)     Cardiac Studies Reviewed: Echo 04-03-2016: Study Conclusions  - Left ventricle: The cavity size was normal. Wall thickness was   increased in a pattern of mild LVH.  Systolic function was   vigorous. The estimated ejection fraction was in the range of 65%   to 70%. Wall motion was normal; there were no regional wall   motion abnormalities. Doppler parameters are consistent with   abnormal left ventricular relaxation (grade 1 diastolic   dysfunction).  Impressions:  - Vigorous LV systolic function; mild LVH; grade 1 diastolic   dysfunction; elevated LVOT gradient (2.6 m/s) likely related to   hyperdynamic LV function; aortic valve visually appears to open   well.  ASSESSMENT AND PLAN: Chronic diastolic heart failure: NYHA 2-3 symptoms. Suspect dyspnea is primarily related to obesity, deconditioning, OSA, and chronic lung disease. Would continue current diuretic regimen which is much more reasonable. She was clearly over-diuresed on torsemide 60 mg daily. Reviewed sodium restriction and daily weights with sliding scale furosemide. No changes in her medical regimen made today.   Morbid obesity with BMI >40. She is working on lifestyle modification.   Current medicines are reviewed with the patient today.  The patient does not have concerns regarding medicines.  Labs/ tests ordered today include:  No orders of the defined types were placed in this encounter.  Disposition:   FU one year  Signed, [name], MD  02/22/2017 5:28 PM    [address], [place], [place]  [address] Phone: [phone]; Fax: [phone]

## 2017-02-22 NOTE — Telephone Encounter (Signed)
Called and spoke with patient in regards to Pulmonary Rehab - Scheduled orientation on 03/29/2017 at 12:00pm. Patient will attend the 1:30pm exc class.

## 2017-02-22 NOTE — Telephone Encounter (Signed)
CPAP 11/19/16 to 02/16/17 >> used on 88 of 90 nights with average 6 hrs 28 min.  Average AHI 0.3 with CPAP 13 cm H2O.   Please let her know that the download shows excellent control of her sleep apnea.  Will call her back once overnight oxygen test is reviewed.

## 2017-02-22 NOTE — Patient Instructions (Signed)
Medication Instructions:  Your provider recommends that you continue on your current medications as directed. Please refer to the Current Medication list given to you today.    Labwork: None  Testing/Procedures: None   Follow-Up: Your provider wants you to follow-up in: 1 year with Dr. Burt Knack. You will receive a reminder letter in the mail two months in advance. If you don't receive a letter, please call our office to schedule the follow-up appointment.    Any Other Special Instructions Will Be Listed Below (If Applicable).     If you need a refill on your cardiac medications before your next appointment, please call your pharmacy.

## 2017-02-22 NOTE — Telephone Encounter (Signed)
Patients insurance is active and benefits verified through [facility] - $30.00 co-pay, no deductible, out of pocket amount of $5,900/$691.92 has been met, no co-insurance, and no pre-authorization is required. Reference #[id number]

## 2017-02-23 NOTE — Telephone Encounter (Signed)
lmtcb x1 for pt. 

## 2017-02-24 ENCOUNTER — Telehealth: Payer: Self-pay | Admitting: Pulmonary Disease

## 2017-02-24 NOTE — Telephone Encounter (Signed)
[name], MD     9:33 AM  Note    CPAP 11/19/16 to 02/16/17 >> used on 88 of 90 nights with average 6 hrs 28 min.  Average AHI 0.3 with CPAP 13 cm H2O.   Please let her know that the download shows excellent control of her sleep apnea.  Will call her back once overnight oxygen test is reviewed.     Advised pt of results. Pt understood and nothing further is needed.

## 2017-02-24 NOTE — Telephone Encounter (Signed)
Pt is calling back  [phone]

## 2017-02-24 NOTE — Telephone Encounter (Signed)
lmtcb x2 for pt. 

## 2017-02-25 NOTE — Telephone Encounter (Signed)
Please see telephone encounter from 02/24/17. Pt was made aware of her download results. Nothing further was needed.

## 2017-03-06 DIAGNOSIS — G4733 Obstructive sleep apnea (adult) (pediatric): Secondary | ICD-10-CM | POA: Diagnosis not present

## 2017-03-23 ENCOUNTER — Telehealth: Payer: Self-pay | Admitting: Pulmonary Disease

## 2017-03-23 NOTE — Telephone Encounter (Signed)
Overnight oximetry >> test time 10 hrs 34 min.  Basal SpO2 93%, low SpO2 88%.   Please call her the DME to verify when the test was done and how the test was done.  The report states it was from 03/14/2016 and done on room air.    The test was ordered on 02/17/17 and was supposed to be done on CPAP.

## 2017-03-24 DIAGNOSIS — E1129 Type 2 diabetes mellitus with other diabetic kidney complication: Secondary | ICD-10-CM | POA: Diagnosis not present

## 2017-03-25 ENCOUNTER — Ambulatory Visit: Payer: Medicare HMO | Admitting: Cardiovascular Disease

## 2017-03-25 DIAGNOSIS — E782 Mixed hyperlipidemia: Secondary | ICD-10-CM | POA: Diagnosis not present

## 2017-03-25 DIAGNOSIS — E1129 Type 2 diabetes mellitus with other diabetic kidney complication: Secondary | ICD-10-CM | POA: Diagnosis not present

## 2017-03-25 DIAGNOSIS — Z6841 Body Mass Index (BMI) 40.0 and over, adult: Secondary | ICD-10-CM | POA: Diagnosis not present

## 2017-03-25 DIAGNOSIS — E1165 Type 2 diabetes mellitus with hyperglycemia: Secondary | ICD-10-CM | POA: Diagnosis not present

## 2017-03-25 NOTE — Telephone Encounter (Addendum)
Spoke with Raven with [employer] regarding ONO test Per VS report shows the test was completed on room air, should of been with cpap machine Per Raven, she is forwarding message to Eva with [employer] to follow up and review LVM for Kathleen Hatfield to return call regarding ONO test with patient. X1  Will try to contact Kathleen Hatfield in the morning

## 2017-03-26 NOTE — Telephone Encounter (Signed)
After hours for [employer].  Will follow up on Monday.

## 2017-03-29 ENCOUNTER — Encounter (HOSPITAL_COMMUNITY)
Admission: RE | Admit: 2017-03-29 | Discharge: 2017-03-29 | Disposition: A | Discharge status: Home / Self Care | Payer: Medicare HMO | Source: Ambulatory Visit | Attending: Pulmonary Disease | Admitting: Pulmonary Disease

## 2017-03-29 VITALS — BP 135/91 | HR 88 | Ht 65.0 in | Wt 276.7 lb

## 2017-03-29 DIAGNOSIS — J45902 Unspecified asthma with status asthmaticus: Secondary | ICD-10-CM

## 2017-03-29 DIAGNOSIS — J449 Chronic obstructive pulmonary disease, unspecified: Secondary | ICD-10-CM | POA: Insufficient documentation

## 2017-03-29 NOTE — Progress Notes (Signed)
Kathleen Hatfield 62 y.o. female Pulmonary Rehab Orientation Note Patient arrived today in Cardiac and Pulmonary Rehab for orientation to Pulmonary Rehab. She was transported from [employer] via wheel chair. She does not carry portable oxygen. Per pt, she uses oxygen never. Color good, skin warm and dry. Patient is oriented to time and place. Patient's medical history, psychosocial health, and medications reviewed. Psychosocial assessment reveals pt lives with her significant other. Pt is currently disabled due to heart failure, COPD, asthma. Pt hobbies include puzzles, and selling items on [employer]. Pt reports her stress level is high. Areas of stress/anxiety include Health.  Pt does exhibits signs of depression. Signs of depression include helplessness, hopelessness and sadness and difficulty falling asleep and difficulty maintaining sleep. PHQ2/9 score 5/20. Pt shows fair  coping skills with positive outlook . She is on an antidepressant and has seen a psychologist and psychiatrist in the past and cannot afford this service.  Her PCP is prescribing her antidepressant.  She has no thoughts of suicide. Offered emotional support and reassurance. Will continue to monitor and evaluate progress toward psychosocial goal(s) of being able to go shopping and not be afraid of collapsing. Physical assessment reveals heart rate is normal, breath sounds clear  With wheezing throughout, instructed to do an albuterol nebulizer treatment once she is home.,  Grip strength equal, strong. Distal pulses 2+ bilateral posterior tibial pulses present with 1+ PERIPHERAL edema . Patient reports she does take medications as prescribed. Patient states she follows a Low Sodium, Diabetic diet. The patient reports no specific efforts to gain or lose weight.. Patient's weight will be monitored closely. Demonstration and practice of PLB using pulse oximeter. Patient able to return demonstration satisfactorily. Safety and hand hygiene in the exercise  area reviewed with patient. Patient voices understanding of the information reviewed. Department expectations discussed with patient and achievable goals were set. The patient shows enthusiasm about attending the program and we look forward to working with this nice lady. The patient is scheduled for a 6 min walk test on Thursday, April 01, 2017 @ 3:45pm and to begin exercise on Thursday, April 08, 2017 in the 1:30pm class.  [id number]

## 2017-03-30 NOTE — Telephone Encounter (Addendum)
Left voice mail on machine for patient to return phone call back regarding Kathleen Hatfield with [employer]. X2 Trying to determine how the ONO test was completed for the patient  The report states it was from 03/14/2016 and done on room air.   The test was ordered on 02/17/17 and was supposed to be done on CPAP.  Will follow up

## 2017-03-31 ENCOUNTER — Encounter (HOSPITAL_COMMUNITY): Payer: Self-pay | Admitting: *Deleted

## 2017-04-01 ENCOUNTER — Encounter (HOSPITAL_COMMUNITY)
Admission: RE | Admit: 2017-04-01 | Discharge: 2017-04-01 | Disposition: A | Discharge status: Home / Self Care | Payer: Medicare HMO | Source: Ambulatory Visit | Attending: Pulmonary Disease | Admitting: Pulmonary Disease

## 2017-04-01 DIAGNOSIS — J449 Chronic obstructive pulmonary disease, unspecified: Secondary | ICD-10-CM

## 2017-04-01 DIAGNOSIS — J45902 Unspecified asthma with status asthmaticus: Secondary | ICD-10-CM

## 2017-04-02 NOTE — Progress Notes (Signed)
Pulmonary Individual Treatment Plan  Patient Details  Name: Kathleen Hatfield MRN: [id number] Date of Birth: 12-21-1955 Referring Provider:     Pulmonary Rehab Walk Test from 04/01/2017 in [place]  Referring Provider  Dr. Halford Chessman      Initial Encounter Date:    Pulmonary Rehab Walk Test from 04/01/2017 in [place]  Date  04/01/17  Referring Provider  Dr. 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  acetaminophen (TYLENOL) 500 MG tablet, Take 500 mg by mouth every 4 (four) hours as needed (pain). , Disp: , Rfl:  .  albuterol (PROVENTIL) (2.5 MG/3ML) 0.083% nebulizer solution, Take 3 mLs (2.5 mg total) by nebulization every 4 (four) hours as needed for wheezing., Disp: 180 mL, Rfl: 2 .  Alum Hydroxide-Mag Carbonate (GAVISCON PO), Take 2 tablets by mouth at bedtime as needed (reflux). , Disp: , Rfl:  .  baclofen (LIORESAL) 5 mg TABS tablet, Take 2.5 mg by mouth 2 (two) times daily as needed for muscle spasms. , Disp: , Rfl:  .  BAYER CONTOUR NEXT TEST test strip, As directed, Disp: , Rfl:  .  Cholecalciferol (VITAMIN D-3) 5000 UNITS TABS, Take 5,000 Units by mouth daily. , Disp: , Rfl:  .  DULoxetine (CYMBALTA) 60 MG capsule, Take 60 mg by mouth 2 (two) times daily. , Disp: , Rfl:  .  estradiol (ESTRACE) 2 MG tablet, Take 1 tablet (2 mg total) by mouth daily., Disp: 90 tablet, Rfl: 3 .  fenofibrate (TRICOR) 145 MG tablet, Take 145 mg by mouth at bedtime. , Disp: , Rfl: 11 .  folic acid (FOLVITE) 1 MG tablet, Take 1 mg by mouth 2 (two) times daily., Disp: , Rfl:  .  furosemide (LASIX) 20 MG tablet, Take 10 mg by mouth daily. Take a whole tablet (51m) by mouth once daily if needed for increased swelling, Disp: , Rfl:  .  gabapentin (NEURONTIN) 100 MG capsule, Take 400 mg by  mouth at bedtime. , Disp: , Rfl:  .  GLOBAL EASE INJECT PEN NEEDLES 32G X 4 MM MISC, , Disp: , Rfl:  .  ibuprofen (ADVIL,MOTRIN) 200 MG tablet, Take 200 mg by mouth every 6 (six) hours as needed (for pain)., Disp: , Rfl:  .  INVOKANA 100 MG TABS tablet, Take 100 mg by mouth daily. , Disp: , Rfl:  .  Liraglutide (VICTOZA) 18 MG/3ML SOPN, Inject 1.2 mg into the skin every evening. , Disp: , Rfl:  .  MICROLET LANCETS MISC, As directed, Disp: , Rfl:  .  ondansetron (ZOFRAN ODT) 4 MG disintegrating tablet, Take 1 tablet (4 mg total) by mouth every 8 (eight) hours as needed for nausea or vomiting., Disp: 20 tablet, Rfl: 0 .  pantoprazole (PROTONIX) 40 MG tablet, Take 40 mg by mouth daily. , Disp: , Rfl:  .  PROAIR HFA 108 (90 Base) MCG/ACT inhaler, INHALE 2 PUFFS INTO THE LUNGS EVERY FOUR HOURS AS NEEDED FOR WHEEZING., Disp: 8.5 g, Rfl: 5 .  rOPINIRole (REQUIP) 4 MG tablet, Take 4 mg by mouth 2 (two) times daily. MIDDAY AND BEDTIME, Disp: , Rfl:  .  rosuvastatin (CRESTOR) 10 MG tablet, Take 10 mg by mouth once a week. , Disp: , Rfl:  .  Simethicone (GAS-X ULTRA STRENGTH) 180 MG CAPS, Take 2 capsules by mouth  daily as needed (for gas)., Disp: , Rfl:  .  spironolactone (ALDACTONE) 25 MG tablet, Take 25 mg by mouth daily., Disp: , Rfl:  .  TOUJEO SOLOSTAR 300 UNIT/ML SOPN, Inject 70 Units into the skin daily. , Disp: , Rfl:  .  traMADol (ULTRAM) 50 MG tablet, Take 1.5-2 tablets (75-100 mg total) by mouth every 6 (six) hours as needed for severe pain., Disp: 50 tablet, Rfl: 0 .  umeclidinium bromide (INCRUSE ELLIPTA) 62.5 MCG/INH AEPB, Inhale 1 puff into the lungs daily. (Patient not taking: Reported on 03/29/2017), Disp: 1 each, Rfl: 5 .  UNABLE TO FIND, CPAP at bedtime, Disp: , Rfl:   Past Medical History: Past Medical History:  Diagnosis Date  . Anemia   . Arthritis   . Asthma   . CHF (congestive heart failure) ([place]) 05/2014  . Chronic diastolic heart failure ([place])   . CKD (chronic kidney disease)    . Depression   . Diabetes mellitus without complication ([place])   . Diverticulitis   . Dyspnea    with exertion  . GERD (gastroesophageal reflux disease)   . History of blood transfusion   . Hyperlipidemia    cannot tolerate statins  . Hypertension   . Peripheral neuropathy   . Pneumonia   . PONV (postoperative nausea and vomiting)   . Sleep apnea    uses CPAP  . Ulcerative colitis ([place])    dr Collene Mares    Tobacco Use: Social History   Tobacco Use  Smoking Status Never Smoker  Smokeless Tobacco Never Used    Labs: Recent Review Flowsheet Data    Labs for ITP Cardiac and Pulmonary Rehab Latest Ref Rng & Units 07/02/2015 10/21/2015 10/22/2015 04/02/2016 04/03/2016   Cholestrol 0 - 200 mg/dL - - - - -   LDLCALC 0 - 99 mg/dL - - - - -   HDL >39 mg/dL - - - - -   Trlycerides <150 mg/dL - - - - -   Hemoglobin A1c 4.8 - 5.6 % - 7.4(H) - 7.6(H) -   PHART 7.350 - 7.450 7.481(H) - 7.438 - 7.456(H)   PCO2ART 32.0 - 48.0 mmHg 35.8 - 41.0 - 45.2   HCO3 20.0 - 28.0 mmol/L 26.4(H) - 27.3(H) - 31.4(H)   TCO2 0 - 100 mmol/L 23.9 - 25.1 - -   O2SAT % 96.5 - 95.7 - 90.6      Capillary Blood Glucose: Lab Results  Component Value Date   GLUCAP 320 (H) 04/09/2016   GLUCAP 324 (H) 04/09/2016   GLUCAP 259 (H) 04/08/2016   GLUCAP 381 (H) 04/08/2016   GLUCAP 311 (H) 04/08/2016     Pulmonary Assessment Scores: Pulmonary Assessment Scores    Row Name 03/31/17 1511 04/02/17 0710       ADL UCSD   ADL Phase  Entry  Entry    SOB Score total  97  -      CAT Score   CAT Score  97 Entry  -      mMRC Score   mMRC Score  -  4       Pulmonary Function Assessment:   Exercise Target Goals: Date: 04/01/17  Exercise Program Goal: Individual exercise prescription set using results from initial 6 min walk test and THRR while considering  patient's activity barriers and safety.    Exercise Prescription Goal: Initial exercise prescription builds to 30-45 minutes a day of aerobic activity,  2-3 days per week.  Home exercise guidelines will be given  to patient during program as part of exercise prescription that the participant will acknowledge.  Activity Barriers & Risk Stratification:   6 Minute Walk: 6 Minute Walk    Row Name 04/02/17 0711         6 Minute Walk   Phase  Initial     Distance  500 feet     Walk Time  - 5 minutes 22 seconds     # of Rest Breaks  - 1 standing break lasting 38 seconds     MPH  1     METS  1.7     RPE  14     Perceived Dyspnea   4     Symptoms  Yes (comment)     Comments  Used rollator-back pain 8/10      Resting HR  100 bpm     Resting BP  148/91     Resting Oxygen Saturation   93 %     Exercise Oxygen Saturation  during 6 min walk  92 %     Max Ex. HR  115 bpm     Max Ex. BP  144/82       Interval HR   1 Minute HR  110     2 Minute HR  116     3 Minute HR  116     4 Minute HR  115     5 Minute HR  114     6 Minute HR  114     2 Minute Post HR  105     Interval Heart Rate?  Yes       Interval Oxygen   Interval Oxygen?  Yes     Baseline Oxygen Saturation %  93 %     1 Minute Oxygen Saturation %  92 %     1 Minute Liters of Oxygen  0 L     2 Minute Oxygen Saturation %  93 %     2 Minute Liters of Oxygen  0 L     3 Minute Oxygen Saturation %  93 %     3 Minute Liters of Oxygen  0 L     4 Minute Oxygen Saturation %  93 %     4 Minute Liters of Oxygen  0 L     5 Minute Oxygen Saturation %  94 %     5 Minute Liters of Oxygen  0 L     6 Minute Oxygen Saturation %  94 %     6 Minute Liters of Oxygen  0 L     2 Minute Post Oxygen Saturation %  96 %     2 Minute Post Liters of Oxygen  0 L        Oxygen Initial Assessment: Oxygen Initial Assessment - 04/02/17 0708      Initial 6 min Walk   Oxygen Used  None      Program Oxygen Prescription   Program Oxygen Prescription  None       Oxygen Re-Evaluation:   Oxygen Discharge (Final Oxygen Re-Evaluation):   Initial Exercise Prescription: Initial Exercise  Prescription - 04/02/17 0700      Date of Initial Exercise RX and Referring Provider   Date  04/01/17    Referring Provider  Dr. Halford Chessman      NuStep   Level  1    SPM  70    Minutes  17    METs  1.5  Track   Laps  3    Minutes  17      Prescription Details   Frequency (times per week)  2    Duration  Progress to 45 minutes of aerobic exercise without signs/symptoms of physical distress      Intensity   THRR 40-80% of Max Heartrate  63-126    Ratings of Perceived Exertion  11-13    Perceived Dyspnea  0-4      Progression   Progression  Continue progressive overload as per policy without signs/symptoms or physical distress.      Resistance Training   Training Prescription  Yes    Weight  orange bands    Reps  10-15       Perform Capillary Blood Glucose checks as needed.  Exercise Prescription Changes:   Exercise Comments:   Exercise Goals and Review:   Exercise Goals Re-Evaluation :   Discharge Exercise Prescription (Final Exercise Prescription Changes):   Nutrition:  Target Goals: Understanding of nutrition guidelines, daily intake of sodium <1532m, cholesterol <2020m calories 30% from fat and 7% or less from saturated fats, daily to have 5 or more servings of fruits and vegetables.  Biometrics: Pre Biometrics - 03/29/17 1346      Pre Biometrics   Grip Strength  32 kg        Nutrition Therapy Plan and Nutrition Goals:   Nutrition Assessments:   Nutrition Goals Re-Evaluation:   Nutrition Goals Discharge (Final Nutrition Goals Re-Evaluation):   Psychosocial: Target Goals: Acknowledge presence or absence of significant depression and/or stress, maximize coping skills, provide positive support system. Participant is able to verbalize types and ability to use techniques and skills needed for reducing stress and depression.  Initial Review & Psychosocial Screening: Initial Psych Review & Screening - 03/29/17 1308      Initial Review    Current issues with  Current Depression;History of Depression;Current Psychotropic Meds;Current Sleep Concerns      Family Dynamics   Good Support System?  Yes      Barriers   Psychosocial barriers to participate in program  Psychosocial barriers identified (see note) her illnesses are her source of stress      Screening Interventions   Interventions  Encouraged to exercise    Expected Outcomes  Short Term goal: Utilizing psychosocial counselor, staff and physician to assist with identification of specific Stressors or current issues interfering with healing process. Setting desired goal for each stressor or current issue identified.;Long Term Goal: Stressors or current issues are controlled or eliminated.;Short Term goal: Identification and review with participant of any Quality of Life or Depression concerns found by scoring the questionnaire.;Long Term goal: The participant improves quality of Life and PHQ9 Scores as seen by post scores and/or verbalization of changes       Quality of Life Scores:  Scores of 19 and below usually indicate a poorer quality of life in these areas.  A difference of  2-3 points is a clinically meaningful difference.  A difference of 2-3 points in the total score of the Quality of Life Index has been associated with significant improvement in overall quality of life, self-image, physical symptoms, and general health in studies assessing change in quality of life.   PHQ-9: Recent Review Flowsheet Data    [facility] 03/[facility]/2017   Decreased Interest 3   Down, Depressed, Hopeless 2   PHQ - 2 Score 5   Altered sleeping 3   Tired, decreased energy 3  Change in appetite 1   Feeling bad or failure about yourself  3   Trouble concentrating 3   Moving slowly or fidgety/restless 2   Suicidal thoughts 0   PHQ-9 Score 20   Difficult doing work/chores Extremely dIfficult     Interpretation of Total Score  Total Score Depression Severity:  1-4 =  Minimal depression, 5-9 = Mild depression, 10-14 = Moderate depression, 15-19 = Moderately severe depression, 20-27 = Severe depression   Psychosocial Evaluation and Intervention: Psychosocial Evaluation - 03/29/17 1310      Psychosocial Evaluation & Interventions   Interventions  Encouraged to exercise with the program and follow exercise prescription    Comments  Is taking cymbalta and this helps with depression, cannot afford a psychologist and sees her PCP for the depression    Continue Psychosocial Services   Follow up required by staff       Psychosocial Re-Evaluation:   Psychosocial Discharge (Final Psychosocial Re-Evaluation):   Education: Education Goals: Education classes will be provided on a weekly basis, covering required topics. Participant will state understanding/return demonstration of topics presented.  Learning Barriers/Preferences: Learning Barriers/Preferences - 03/29/17 1301      Learning Barriers/Preferences   Learning Preferences  Computer/Internet;Written Material;Video;Verbal Instruction;Skilled Demonstration;Pictoral;Individual Instruction       Education Topics: Risk Factor Reduction:  -Group instruction that is supported by a PowerPoint presentation. Instructor discusses the definition of a risk factor, different risk factors for pulmonary disease, and how the heart and lungs work together.     Nutrition for Pulmonary Patient:  -Group instruction provided by PowerPoint slides, verbal discussion, and written materials to support subject matter. The instructor gives an explanation and review of healthy diet recommendations, which includes a discussion on weight management, recommendations for fruit and vegetable consumption, as well as protein, fluid, caffeine, fiber, sodium, sugar, and alcohol. Tips for eating when patients are short of breath are discussed.   Pursed Lip Breathing:  -Group instruction that is supported by demonstration and  informational handouts. Instructor discusses the benefits of pursed lip and diaphragmatic breathing and detailed demonstration on how to preform both.     Oxygen Safety:  -Group instruction provided by PowerPoint, verbal discussion, and written material to support subject matter. There is an overview of "What is Oxygen" and "Why do we need it".  Instructor also reviews how to create a safe environment for oxygen use, the importance of using oxygen as prescribed, and the risks of noncompliance. There is a brief discussion on traveling with oxygen and resources the patient may utilize.   Oxygen Equipment:  -Group instruction provided by [facility] Staff utilizing handouts, written materials, and equipment demonstrations.   Signs and Symptoms:  -Group instruction provided by written material and verbal discussion to support subject matter. Warning signs and symptoms of infection, stroke, and heart attack are reviewed and when to call the physician/911 reinforced. Tips for preventing the spread of infection discussed.   Advanced Directives:  -Group instruction provided by verbal instruction and written material to support subject matter. Instructor reviews Advanced Directive laws and proper instruction for filling out document.   Pulmonary Video:  -Group video education that reviews the importance of medication and oxygen compliance, exercise, good nutrition, pulmonary hygiene, and pursed lip and diaphragmatic breathing for the pulmonary patient.   Exercise for the Pulmonary Patient:  -Group instruction that is supported by a PowerPoint presentation. Instructor discusses benefits of exercise, core components of exercise, frequency, duration, and intensity of an exercise routine, importance  of utilizing pulse oximetry during exercise, safety while exercising, and options of places to exercise outside of rehab.     Pulmonary Medications:  -Verbally interactive group education provided by  instructor with focus on inhaled medications and proper administration.   Anatomy and Physiology of the Respiratory System and Intimacy:  -Group instruction provided by PowerPoint, verbal discussion, and written material to support subject matter. Instructor reviews respiratory cycle and anatomical components of the respiratory system and their functions. Instructor also reviews differences in obstructive and restrictive respiratory diseases with examples of each. Intimacy, Sex, and Sexuality differences are reviewed with a discussion on how relationships can change when diagnosed with pulmonary disease. Common sexual concerns are reviewed.   MD DAY -A group question and answer session with a medical doctor that allows participants to ask questions that relate to their pulmonary disease state.   OTHER EDUCATION -Group or individual verbal, written, or video instructions that support the educational goals of the pulmonary rehab program.   Knowledge Questionnaire Score: Knowledge Questionnaire Score - 03/31/17 1510      Knowledge Questionnaire Score   Pre Score  16/18       Core Components/Risk Factors/Patient Goals at Admission: Personal Goals and Risk Factors at Admission - 03/29/17 1306      Core Components/Risk Factors/Patient Goals on Admission    Weight Management  Obesity;Weight Loss    Improve shortness of breath with ADL's  Yes    Intervention  Provide education, individualized exercise plan and daily activity instruction to help decrease symptoms of SOB with activities of daily living.    Expected Outcomes  Short Term: Improve cardiorespiratory fitness to achieve a reduction of symptoms when performing ADLs;Long Term: Be able to perform more ADLs without symptoms or delay the onset of symptoms    Heart Failure  Yes    Intervention  Provide a combined exercise and nutrition program that is supplemented with education, support and counseling about heart failure. Directed toward  relieving symptoms such as shortness of breath, decreased exercise tolerance, and extremity edema.    Expected Outcomes  Improve functional capacity of life;Short term: Attendance in program 2-3 days a week with increased exercise capacity. Reported lower sodium intake. Reported increased fruit and vegetable intake. Reports medication compliance.;Short term: Daily weights obtained and reported for increase. Utilizing diuretic protocols set by physician.;Long term: Adoption of self-care skills and reduction of barriers for early signs and symptoms recognition and intervention leading to self-care maintenance.    Stress  Yes    Intervention  Offer individual and/or small group education and counseling on adjustment to heart disease, stress management and health-related lifestyle change. Teach and support self-help strategies.;Refer participants experiencing significant psychosocial distress to appropriate mental health specialists for further evaluation and treatment. When possible, include family members and significant others in education/counseling sessions.    Expected Outcomes  Short Term: Participant demonstrates changes in health-related behavior, relaxation and other stress management skills, ability to obtain effective social support, and compliance with psychotropic medications if prescribed.;Long Term: Emotional wellbeing is indicated by absence of clinically significant psychosocial distress or social isolation.       Core Components/Risk Factors/Patient Goals Review:    Core Components/Risk Factors/Patient Goals at Discharge (Final Review):    ITP Comments:   Comments:

## 2017-04-06 DIAGNOSIS — G4733 Obstructive sleep apnea (adult) (pediatric): Secondary | ICD-10-CM | POA: Diagnosis not present

## 2017-04-08 ENCOUNTER — Encounter (HOSPITAL_COMMUNITY)
Admission: RE | Admit: 2017-04-08 | Discharge: 2017-04-08 | Disposition: A | Discharge status: Home / Self Care | Payer: Medicare HMO | Source: Ambulatory Visit | Attending: Pulmonary Disease | Admitting: Pulmonary Disease

## 2017-04-08 DIAGNOSIS — J45902 Unspecified asthma with status asthmaticus: Secondary | ICD-10-CM

## 2017-04-08 DIAGNOSIS — J449 Chronic obstructive pulmonary disease, unspecified: Secondary | ICD-10-CM

## 2017-04-08 NOTE — Progress Notes (Signed)
Daily Session Note  Patient Details  Name: Kathleen Hatfield MRN: [id number] Date of Birth: [birth year] Referring Provider:     Pulmonary Rehab Walk Test from 04/01/2017 in [place]  Referring Provider  Dr. Halford Chessman      Encounter Date: 04/08/2017  Check In: Session Check In - 04/08/17 1314      Check-In   Location  [facility]    Staff Present  Su Hilt, MS, ACSM RCEP, Exercise Physiologist;Janiya Millirons Rollene Rotunda, RN, BSN;Lisa Ysidro Evert, RN;Ruhi Leonia Reeves, RN, BSN    Supervising physician immediately available to respond to emergencies  [facility] immediately available    Physician(s)  Dr. Tyrell Antonio    Medication changes reported      No    Fall or balance concerns reported     No    Tobacco Cessation  No Change    Warm-up and Cool-down  Performed as group-led instruction    Resistance Training Performed  Yes    VAD Patient?  No      Pain Assessment   Currently in Pain?  No/denies    Multiple Pain Sites  No       Capillary Blood Glucose: No results found for this or any previous visit (from the past 24 hour(s)).    Social History   Tobacco Use  Smoking Status Never Smoker  Smokeless Tobacco Never Used    Goals Met:  Exercise tolerated well No report of cardiac concerns or symptoms Strength training completed today  Goals Unmet:  Not Applicable  Comments: Service time is from 1300 to 1445 Patient was a late arrival but attended an education session on chronic illness and mental health   Dr. Rush Farmer is Medical Director for Pulmonary Rehab at [facility].

## 2017-04-12 NOTE — Telephone Encounter (Addendum)
Left voice mail on machine for [place] with [employer] to return phone call back regarding pt's ONO. X3

## 2017-04-13 ENCOUNTER — Encounter (HOSPITAL_COMMUNITY)
Admission: RE | Admit: 2017-04-13 | Discharge: 2017-04-13 | Disposition: A | Discharge status: Home / Self Care | Payer: Medicare HMO | Source: Ambulatory Visit | Attending: Pulmonary Disease | Admitting: Pulmonary Disease

## 2017-04-13 DIAGNOSIS — J441 Chronic obstructive pulmonary disease with (acute) exacerbation: Secondary | ICD-10-CM | POA: Diagnosis not present

## 2017-04-13 NOTE — Progress Notes (Signed)
Daily Session Note  Patient Details  Name: Kathleen Hatfield MRN: [id number] Date of Birth: [birth year] Referring Provider:     Pulmonary Rehab Walk Test from 04/01/2017 in [place]  Referring Provider  Dr. Halford Chessman      Encounter Date: 04/13/2017  Check In:   Capillary Blood Glucose: No results found for this or any previous visit (from the past 24 hour(s)). POCT Glucose - 04/13/17 1531      POCT Blood Glucose   Pre-Exercise  216 mg/dL    Post-Exercise  173 mg/dL      Exercise Prescription Changes - 04/13/17 1531      Response to Exercise   Blood Pressure (Admit)  126/64    Blood Pressure (Exercise)  130/62    Blood Pressure (Exit)  140/68    Heart Rate (Admit)  90 bpm    Heart Rate (Exercise)  102 bpm    Heart Rate (Exit)  98 bpm    Oxygen Saturation (Admit)  93 %    Oxygen Saturation (Exercise)  94 %    Oxygen Saturation (Exit)  95 %    Rating of Perceived Exertion (Exercise)  13    Perceived Dyspnea (Exercise)  2    Duration  Progress to 45 minutes of aerobic exercise without signs/symptoms of physical distress    Intensity  THRR unchanged      Progression   Progression  Continue to progress workloads to maintain intensity without signs/symptoms of physical distress.      Resistance Training   Training Prescription  Yes    Weight  orange bands    Reps  10-15      Oxygen   Oxygen  -- room air      NuStep   Level  1    SPM  70    Minutes  51    METs  1.8      Track   Laps  3    Minutes  17       Social History   Tobacco Use  Smoking Status Never Smoker  Smokeless Tobacco Never Used    Goals Met:  Exercise tolerated well No report of cardiac concerns or symptoms Strength training completed today  Goals Unmet:  Not Applicable  Comments: Service time is from 1330 to 36    Dr. Rush Farmer is Medical Director for Pulmonary Rehab at [facility].

## 2017-04-13 NOTE — Telephone Encounter (Signed)
Left message with Hilda Blades, needing to speak with [facility] regarding ONO test on pt. Alyse Low has been out of the office last two days. X4

## 2017-04-14 ENCOUNTER — Encounter (HOSPITAL_COMMUNITY): Payer: Self-pay | Admitting: *Deleted

## 2017-04-15 ENCOUNTER — Encounter (HOSPITAL_COMMUNITY)
Admission: RE | Admit: 2017-04-15 | Discharge: 2017-04-15 | Disposition: A | Discharge status: Home / Self Care | Payer: Medicare HMO | Source: Ambulatory Visit | Attending: Pulmonary Disease | Admitting: Pulmonary Disease

## 2017-04-15 DIAGNOSIS — J449 Chronic obstructive pulmonary disease, unspecified: Secondary | ICD-10-CM

## 2017-04-15 DIAGNOSIS — J45902 Unspecified asthma with status asthmaticus: Secondary | ICD-10-CM

## 2017-04-15 NOTE — Progress Notes (Signed)
Kathleen Hatfield 62 y.o. female   DOB: 04-Aug-1955 MRN: [id number]          Nutrition 1. 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 Anemia   . Arthritis   . Asthma   . CHF (congestive heart failure) ([place]) 05/2014  . Chronic diastolic heart failure ([place])   . CKD (chronic kidney disease)   . Depression   . Diabetes mellitus without complication ([place])   . Diverticulitis   . Dyspnea    with exertion  . GERD (gastroesophageal reflux disease)   . History of blood transfusion   . Hyperlipidemia    cannot tolerate statins  . Hypertension   . Peripheral neuropathy   . Pneumonia   . PONV (postoperative nausea and vomiting)   . Sleep apnea    uses CPAP  . Ulcerative colitis ([place])    dr Collene Mares   Meds reviewed. Invokana, Victoza, Toujeo noted  Ht: Ht Readings from Last 1 Encounters:  03/29/17 5' 5"  (1.651 m)     Wt:  Wt Readings from Last 3 Encounters:  04/13/17 273 lb 13 oz (124.2 kg)  03/29/17 276 lb 10.8 oz (125.5 kg)  02/22/17 275 lb (124.7 kg)     BMI: 45.6    Current tobacco use? No  Labs:  Lab Results  Component Value Date   HGBA1C 7.6 (H) 04/02/2016   Note Spoke with pt. Pt is obese. There are some ways pt can make her eating habits healthier. Per discussion, pt has met with the RD at Dr. Bary Leriche office "about a month ago and I'm still working on that stuff." Pt expressed anxiety with managing medications, eating, and coming to exercise. Pt is diabetic.  Pt's pre-exercise CBG's 155 mg/dL today. Pt states she ate an egg sandwich for breakfast at 9 am, muscle milk made with 2% milk after breakfast, and then ate peanut butter crackers ("the orange kind") before exercise. Pt educated re: importance of eating 1-2 hours before exercise. Pt expressed understanding of the information reviewed via feedback method.    Nutrition Diagnosis ? Food-and nutrition-related knowledge deficit  related to lack of exposure to information as related to diagnosis of pulmonary disease ? Obesity related to excessive energy intake as evidenced by a BMI of 45.6  Nutrition Intervention ? Pt's individual nutrition plan and goals reviewed with pt.  Goal(s) 1. Identify food quantities necessary to achieve wt loss of  -2# per week to a goal wt loss of 6-24 lb at graduation from pulmonary rehab. 2. CBG's in the normal range or as close to normal as is safely possible.  Plan:  Pt to attend Pulmonary Nutrition class Will provide client-centered nutrition education as part of interdisciplinary care.   Monitor and evaluate progress toward nutrition goal with team.  Monitor and Evaluate progress toward nutrition goal with team.   Derek Mound, M.Ed, RD, LDN, CDE 04/15/2017 3:18 PM

## 2017-04-15 NOTE — Progress Notes (Signed)
Daily Session Note  Patient Details  Name: Kathleen Hatfield MRN: [id number] Date of Birth: 06/10/1955 Referring Provider:     Pulmonary Rehab Walk Test from 04/01/2017 in [facility]  Referring Provider  Dr. Sood      Encounter Date: 04/15/2017  Check In: Session Check In - 04/15/17 1339      Check-In   Location  [facility]    Staff Present  Molly diVincenzo, MS, ACSM RCEP, Exercise Physiologist; , RN, BSN;Lisa Hughes, RN;Zonie Behrens, RN, BSN    Supervising physician immediately available to respond to emergencies  [facility] immediately available    Physician(s)  Dr. Emokpae    Medication changes reported      No    Fall or balance concerns reported     No    Tobacco Cessation  No Change    Warm-up and Cool-down  Performed as group-led instruction    Resistance Training Performed  Yes    VAD Patient?  No      Pain Assessment   Currently in Pain?  No/denies    Multiple Pain Sites  No       Capillary Blood Glucose: No results found for this or any previous visit (from the past 24 hour(s)).    Social History   Tobacco Use  Smoking Status Never Smoker  Smokeless Tobacco Never Used    Goals Met:  Exercise tolerated well No report of cardiac concerns or symptoms Strength training completed today  Goals Unmet:  Not Applicable  Comments: Service time is from 1330 to 1535   Dr. Wesam G. Yacoub is Medical Director for Pulmonary Rehab at  Hospital. 

## 2017-04-19 NOTE — Progress Notes (Signed)
Pulmonary Individual Treatment Plan  Patient Details  Name: Kathleen Hatfield MRN: [id number] Date of Birth: Jul 18, 1955 Referring Provider:     Pulmonary Rehab Walk Test from 04/01/2017 in [place]  Referring Provider  Dr. Halford Chessman      Initial Encounter Date:    Pulmonary Rehab Walk Test from 04/01/2017 in [place]  Date  04/01/17  Referring Provider  Dr. 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  acetaminophen (TYLENOL) 500 MG tablet, Take 500 mg by mouth every 4 (four) hours as needed (pain). , Disp: , Rfl:  .  albuterol (PROVENTIL) (2.5 MG/3ML) 0.083% nebulizer solution, Take 3 mLs (2.5 mg total) by nebulization every 4 (four) hours as needed for wheezing., Disp: 180 mL, Rfl: 2 .  Alum Hydroxide-Mag Carbonate (GAVISCON PO), Take 2 tablets by mouth at bedtime as needed (reflux). , Disp: , Rfl:  .  baclofen (LIORESAL) 5 mg TABS tablet, Take 2.5 mg by mouth 2 (two) times daily as needed for muscle spasms. , Disp: , Rfl:  .  BAYER CONTOUR NEXT TEST test strip, As directed, Disp: , Rfl:  .  Cholecalciferol (VITAMIN D-3) 5000 UNITS TABS, Take 5,000 Units by mouth daily. , Disp: , Rfl:  .  DULoxetine (CYMBALTA) 60 MG capsule, Take 60 mg by mouth 2 (two) times daily. , Disp: , Rfl:  .  estradiol (ESTRACE) 2 MG tablet, Take 1 tablet (2 mg total) by mouth daily., Disp: 90 tablet, Rfl: 3 .  fenofibrate (TRICOR) 145 MG tablet, Take 145 mg by mouth at bedtime. , Disp: , Rfl: 11 .  folic acid (FOLVITE) 1 MG tablet, Take 1 mg by mouth 2 (two) times daily., Disp: , Rfl:  .  furosemide (LASIX) 20 MG tablet, Take 10 mg by mouth daily. Take a whole tablet (55m) by mouth once daily if needed for increased swelling, Disp: , Rfl:  .  gabapentin (NEURONTIN) 100 MG capsule, Take 400 mg by  mouth at bedtime. , Disp: , Rfl:  .  GLOBAL EASE INJECT PEN NEEDLES 32G X 4 MM MISC, , Disp: , Rfl:  .  ibuprofen (ADVIL,MOTRIN) 200 MG tablet, Take 200 mg by mouth every 6 (six) hours as needed (for pain)., Disp: , Rfl:  .  INVOKANA 100 MG TABS tablet, Take 100 mg by mouth daily. , Disp: , Rfl:  .  Liraglutide (VICTOZA) 18 MG/3ML SOPN, Inject 1.2 mg into the skin every evening. , Disp: , Rfl:  .  MICROLET LANCETS MISC, As directed, Disp: , Rfl:  .  ondansetron (ZOFRAN ODT) 4 MG disintegrating tablet, Take 1 tablet (4 mg total) by mouth every 8 (eight) hours as needed for nausea or vomiting., Disp: 20 tablet, Rfl: 0 .  pantoprazole (PROTONIX) 40 MG tablet, Take 40 mg by mouth daily. , Disp: , Rfl:  .  PROAIR HFA 108 (90 Base) MCG/ACT inhaler, INHALE 2 PUFFS INTO THE LUNGS EVERY FOUR HOURS AS NEEDED FOR WHEEZING., Disp: 8.5 g, Rfl: 5 .  rOPINIRole (REQUIP) 4 MG tablet, Take 4 mg by mouth 2 (two) times daily. MIDDAY AND BEDTIME, Disp: , Rfl:  .  rosuvastatin (CRESTOR) 10 MG tablet, Take 10 mg by mouth once a week. , Disp: , Rfl:  .  Simethicone (GAS-X ULTRA STRENGTH) 180 MG CAPS, Take 2 capsules by mouth  daily as needed (for gas)., Disp: , Rfl:  .  spironolactone (ALDACTONE) 25 MG tablet, Take 25 mg by mouth daily., Disp: , Rfl:  .  TOUJEO SOLOSTAR 300 UNIT/ML SOPN, Inject 70 Units into the skin daily. , Disp: , Rfl:  .  traMADol (ULTRAM) 50 MG tablet, Take 1.5-2 tablets (75-100 mg total) by mouth every 6 (six) hours as needed for severe pain., Disp: 50 tablet, Rfl: 0 .  umeclidinium bromide (INCRUSE ELLIPTA) 62.5 MCG/INH AEPB, Inhale 1 puff into the lungs daily. (Patient not taking: Reported on 03/29/2017), Disp: 1 each, Rfl: 5 .  UNABLE TO FIND, CPAP at bedtime, Disp: , Rfl:   Past Medical History: Past Medical History:  Diagnosis Date  . Anemia   . Arthritis   . Asthma   . CHF (congestive heart failure) ([place]) 05/2014  . Chronic diastolic heart failure ([place])   . CKD (chronic kidney disease)    . Depression   . Diabetes mellitus without complication ([place])   . Diverticulitis   . Dyspnea    with exertion  . GERD (gastroesophageal reflux disease)   . History of blood transfusion   . Hyperlipidemia    cannot tolerate statins  . Hypertension   . Peripheral neuropathy   . Pneumonia   . PONV (postoperative nausea and vomiting)   . Sleep apnea    uses CPAP  . Ulcerative colitis ([place])    dr Collene Mares    Tobacco Use: Social History   Tobacco Use  Smoking Status Never Smoker  Smokeless Tobacco Never Used    Labs: Recent Review Flowsheet Data    Labs for ITP Cardiac and Pulmonary Rehab Latest Ref Rng & Units 07/02/2015 10/21/2015 10/22/2015 04/02/2016 04/03/2016   Cholestrol 0 - 200 mg/dL - - - - -   LDLCALC 0 - 99 mg/dL - - - - -   HDL >39 mg/dL - - - - -   Trlycerides <150 mg/dL - - - - -   Hemoglobin A1c 4.8 - 5.6 % - 7.4(H) - 7.6(H) -   PHART 7.350 - 7.450 7.481(H) - 7.438 - 7.456(H)   PCO2ART 32.0 - 48.0 mmHg 35.8 - 41.0 - 45.2   HCO3 20.0 - 28.0 mmol/L 26.4(H) - 27.3(H) - 31.4(H)   TCO2 0 - 100 mmol/L 23.9 - 25.1 - -   O2SAT % 96.5 - 95.7 - 90.6      Capillary Blood Glucose: Lab Results  Component Value Date   GLUCAP 320 (H) 04/09/2016   GLUCAP 324 (H) 04/09/2016   GLUCAP 259 (H) 04/08/2016   GLUCAP 381 (H) 04/08/2016   GLUCAP 311 (H) 04/08/2016   POCT Glucose    Row Name 04/13/17 1531 04/15/17 1610           POCT Blood Glucose   Pre-Exercise  216 mg/dL  155 mg/dL      Post-Exercise  173 mg/dL  125 mg/dL         Pulmonary Assessment Scores: Pulmonary Assessment Scores    Row Name 03/31/17 1511 04/02/17 0710 04/14/17 1443     ADL UCSD   ADL Phase  Entry  Entry  -   SOB Score total  97  -  -     CAT Score   CAT Score  97 Entry  -  30 97 mistake this is the correct entry score     mMRC Score   mMRC Score  -  4  -      Pulmonary Function Assessment:  Exercise Target Goals:    Exercise Program Goal: Individual exercise prescription set  using results from initial 6 min walk test and THRR while considering  patient's activity barriers and safety.    Exercise Prescription Goal: Initial exercise prescription builds to 30-45 minutes a day of aerobic activity, 2-3 days per week.  Home exercise guidelines will be given to patient during program as part of exercise prescription that the participant will acknowledge.  Activity Barriers & Risk Stratification:   6 Minute Walk: 6 Minute Walk    Row Name 04/02/17 0711         6 Minute Walk   Phase  Initial     Distance  500 feet     Walk Time  - 5 minutes 22 seconds     # of Rest Breaks  - 1 standing break lasting 38 seconds     MPH  1     METS  1.7     RPE  14     Perceived Dyspnea   4     Symptoms  Yes (comment)     Comments  Used rollator-back pain 8/10      Resting HR  100 bpm     Resting BP  148/91     Resting Oxygen Saturation   93 %     Exercise Oxygen Saturation  during 6 min walk  92 %     Max Ex. HR  115 bpm     Max Ex. BP  144/82       Interval HR   1 Minute HR  110     2 Minute HR  116     3 Minute HR  116     4 Minute HR  115     5 Minute HR  114     6 Minute HR  114     2 Minute Post HR  105     Interval Heart Rate?  Yes       Interval Oxygen   Interval Oxygen?  Yes     Baseline Oxygen Saturation %  93 %     1 Minute Oxygen Saturation %  92 %     1 Minute Liters of Oxygen  0 L     2 Minute Oxygen Saturation %  93 %     2 Minute Liters of Oxygen  0 L     3 Minute Oxygen Saturation %  93 %     3 Minute Liters of Oxygen  0 L     4 Minute Oxygen Saturation %  93 %     4 Minute Liters of Oxygen  0 L     5 Minute Oxygen Saturation %  94 %     5 Minute Liters of Oxygen  0 L     6 Minute Oxygen Saturation %  94 %     6 Minute Liters of Oxygen  0 L     2 Minute Post Oxygen Saturation %  96 %     2 Minute Post Liters of Oxygen  0 L        Oxygen Initial Assessment: Oxygen Initial Assessment - 04/02/17 0708      Initial 6 min Walk   Oxygen  Used  None      Program Oxygen Prescription   Program Oxygen Prescription  None       Oxygen Re-Evaluation: Oxygen Re-Evaluation    Row Name 04/16/17 1150  Program Oxygen Prescription   Program Oxygen Prescription  None         Home Oxygen   Home Oxygen Device  None       Sleep Oxygen Prescription  CPAP       Home Exercise Oxygen Prescription  None       Home at Rest Exercise Oxygen Prescription  None       Compliance with Home Oxygen Use  Yes          Oxygen Discharge (Final Oxygen Re-Evaluation): Oxygen Re-Evaluation - 04/16/17 1150      Program Oxygen Prescription   Program Oxygen Prescription  None      Home Oxygen   Home Oxygen Device  None    Sleep Oxygen Prescription  CPAP    Home Exercise Oxygen Prescription  None    Home at Rest Exercise Oxygen Prescription  None    Compliance with Home Oxygen Use  Yes       Initial Exercise Prescription: Initial Exercise Prescription - 04/02/17 0700      Date of Initial Exercise RX and Referring Provider   Date  04/01/17    Referring Provider  Dr. Halford Chessman      NuStep   Level  1    SPM  70    Minutes  17    METs  1.5      Track   Laps  3    Minutes  17      Prescription Details   Frequency (times per week)  2    Duration  Progress to 45 minutes of aerobic exercise without signs/symptoms of physical distress      Intensity   THRR 40-80% of Max Heartrate  63-126    Ratings of Perceived Exertion  11-13    Perceived Dyspnea  0-4      Progression   Progression  Continue progressive overload as per policy without signs/symptoms or physical distress.      Resistance Training   Training Prescription  Yes    Weight  orange bands    Reps  10-15       Perform Capillary Blood Glucose checks as needed.  Exercise Prescription Changes: Exercise Prescription Changes    Row Name 04/13/17 1531             Response to Exercise   Blood Pressure (Admit)  126/64       Blood Pressure (Exercise)   130/62       Blood Pressure (Exit)  140/68       Heart Rate (Admit)  90 bpm       Heart Rate (Exercise)  102 bpm       Heart Rate (Exit)  98 bpm       Oxygen Saturation (Admit)  93 %       Oxygen Saturation (Exercise)  94 %       Oxygen Saturation (Exit)  95 %       Rating of Perceived Exertion (Exercise)  13       Perceived Dyspnea (Exercise)  2       Duration  Progress to 45 minutes of aerobic exercise without signs/symptoms of physical distress       Intensity  THRR unchanged         Progression   Progression  Continue to progress workloads to maintain intensity without signs/symptoms of physical distress.         Resistance Training   Training Prescription  Yes  Weight  orange bands       Reps  10-15         Oxygen   Oxygen  - room air         NuStep   Level  1       SPM  70       Minutes  51       METs  1.8         Track   Laps  3       Minutes  17          Exercise Comments:   Exercise Goals and Review:   Exercise Goals Re-Evaluation : Exercise Goals Re-Evaluation    [place] Name 04/16/17 1151             Exercise Goal Re-Evaluation   Exercise Goals Review  Increase Strength and Stamina;Able to understand and use Dyspnea scale;Able to understand and use rate of perceived exertion (RPE) scale;Increase Physical Activity;Knowledge and understanding of Target Heart Rate Range (THRR);Understanding of Exercise Prescription       Comments  Patient has only attended three rehab sessions. Will cont. to educate and encourage.        Expected Outcomes  Through exercise at rehab and at home, patient will increase strength and stamina making ADL's easier to perform. Patient will also have a better understanding of safe exercise and what they are capable to do outside of clinical supervision.          Discharge Exercise Prescription (Final Exercise Prescription Changes): Exercise Prescription Changes - 04/13/17 1531      Response to Exercise   Blood Pressure  (Admit)  126/64    Blood Pressure (Exercise)  130/62    Blood Pressure (Exit)  140/68    Heart Rate (Admit)  90 bpm    Heart Rate (Exercise)  102 bpm    Heart Rate (Exit)  98 bpm    Oxygen Saturation (Admit)  93 %    Oxygen Saturation (Exercise)  94 %    Oxygen Saturation (Exit)  95 %    Rating of Perceived Exertion (Exercise)  13    Perceived Dyspnea (Exercise)  2    Duration  Progress to 45 minutes of aerobic exercise without signs/symptoms of physical distress    Intensity  THRR unchanged      Progression   Progression  Continue to progress workloads to maintain intensity without signs/symptoms of physical distress.      Resistance Training   Training Prescription  Yes    Weight  orange bands    Reps  10-15      Oxygen   Oxygen  -- room air      NuStep   Level  1    SPM  70    Minutes  51    METs  1.8      Track   Laps  3    Minutes  17       Nutrition:  Target Goals: Understanding of nutrition guidelines, daily intake of sodium <1555m, cholesterol <2027m calories 30% from fat and 7% or less from saturated fats, daily to have 5 or more servings of fruits and vegetables.  Biometrics: Pre Biometrics - 03/29/17 1346      Pre Biometrics   Grip Strength  32 kg        Nutrition Therapy Plan and Nutrition Goals: Nutrition Therapy & Goals - 04/15/17 1524      Nutrition Therapy  Diet  Carb Modified, Heart Healthy      Personal Nutrition Goals   Nutrition Goal  Identify food quantities necessary to achieve wt loss of  -2# per week to a goal wt loss of 6-24 lb at graduation from pulmonary rehab.    Personal Goal #2  CBG's in the normal range or as close to normal as is safely possible.      Intervention Plan   Intervention  Prescribe, educate and counsel regarding individualized specific dietary modifications aiming towards targeted core components such as weight, hypertension, lipid management, diabetes, heart failure and other comorbidities.    Expected  Outcomes  Short Term Goal: Understand basic principles of dietary content, such as calories, fat, sodium, cholesterol and nutrients.;Long Term Goal: Adherence to prescribed nutrition plan.       Nutrition Assessments: Nutrition Assessments - 04/15/17 1524      Rate Your Plate Scores   Pre Score  53       Nutrition Goals Re-Evaluation:   Nutrition Goals Discharge (Final Nutrition Goals Re-Evaluation):   Psychosocial: Target Goals: Acknowledge presence or absence of significant depression and/or stress, maximize coping skills, provide positive support system. Participant is able to verbalize types and ability to use techniques and skills needed for reducing stress and depression.  Initial Review & Psychosocial Screening: Initial Psych Review & Screening - 03/29/17 1308      Initial Review   Current issues with  Current Depression;History of Depression;Current Psychotropic Meds;Current Sleep Concerns      Family Dynamics   Good Support System?  Yes      Barriers   Psychosocial barriers to participate in program  Psychosocial barriers identified (see note) her illnesses are her source of stress      Screening Interventions   Interventions  Encouraged to exercise    Expected Outcomes  Short Term goal: Utilizing psychosocial counselor, staff and physician to assist with identification of specific Stressors or current issues interfering with healing process. Setting desired goal for each stressor or current issue identified.;Long Term Goal: Stressors or current issues are controlled or eliminated.;Short Term goal: Identification and review with participant of any Quality of Life or Depression concerns found by scoring the questionnaire.;Long Term goal: The participant improves quality of Life and PHQ9 Scores as seen by post scores and/or verbalization of changes       Quality of Life Scores:  Scores of 19 and below usually indicate a poorer quality of life in these areas.  A  difference of  2-3 points is a clinically meaningful difference.  A difference of 2-3 points in the total score of the Quality of Life Index has been associated with significant improvement in overall quality of life, self-image, physical symptoms, and general health in studies assessing change in quality of life.   PHQ-9: Recent Review Flowsheet Data    Depression screen [facility] 2/9 03/29/2017   Decreased Interest 3   Down, Depressed, Hopeless 2   PHQ - 2 Score 5   Altered sleeping 3   Tired, decreased energy 3   Change in appetite 1   Feeling bad or failure about yourself  3   Trouble concentrating 3   Moving slowly or fidgety/restless 2   Suicidal thoughts 0   PHQ-9 Score 20   Difficult doing work/chores Extremely dIfficult     Interpretation of Total Score  Total Score Depression Severity:  1-4 = Minimal depression, 5-9 = Mild depression, 10-14 = Moderate depression, 15-19 = Moderately severe depression, 20-27 =  Severe depression   Psychosocial Evaluation and Intervention: Psychosocial Evaluation - 03/29/17 1310      Psychosocial Evaluation & Interventions   Interventions  Encouraged to exercise with the program and follow exercise prescription    Comments  Is taking cymbalta and this helps with depression, cannot afford a psychologist and sees her PCP for the depression    Continue Psychosocial Services   Follow up required by staff       Psychosocial Re-Evaluation: Psychosocial Re-Evaluation    [place] Name 04/19/17 0922             Psychosocial Re-Evaluation   Current issues with  Current Depression;History of Depression       Comments  No change re: depression since orientation       Expected Outcomes  no barriers to participation in pulmonary rehab       Interventions  Encouraged to attend Pulmonary Rehabilitation for the exercise       Continue Psychosocial Services   Follow up required by staff          Psychosocial Discharge (Final Psychosocial  Re-Evaluation): Psychosocial Re-Evaluation - 04/19/17 0923      Psychosocial Re-Evaluation   Current issues with  Current Depression;History of Depression    Comments  No change re: depression since orientation    Expected Outcomes  no barriers to participation in pulmonary rehab    Interventions  Encouraged to attend Pulmonary Rehabilitation for the exercise    Continue Psychosocial Services   Follow up required by staff       Education: Education Goals: Education classes will be provided on a weekly basis, covering required topics. Participant will state understanding/return demonstration of topics presented.  Learning Barriers/Preferences: Learning Barriers/Preferences - 03/29/17 1301      Learning Barriers/Preferences   Learning Preferences  Computer/Internet;Written Material;Video;Verbal Instruction;Skilled Demonstration;Pictoral;Individual Instruction       Education Topics: Risk Factor Reduction:  -Group instruction that is supported by a PowerPoint presentation. Instructor discusses the definition of a risk factor, different risk factors for pulmonary disease, and how the heart and lungs work together.     Nutrition for Pulmonary Patient:  -Group instruction provided by PowerPoint slides, verbal discussion, and written materials to support subject matter. The instructor gives an explanation and review of healthy diet recommendations, which includes a discussion on weight management, recommendations for fruit and vegetable consumption, as well as protein, fluid, caffeine, fiber, sodium, sugar, and alcohol. Tips for eating when patients are short of breath are discussed.   Pursed Lip Breathing:  -Group instruction that is supported by demonstration and informational handouts. Instructor discusses the benefits of pursed lip and diaphragmatic breathing and detailed demonstration on how to preform both.     Oxygen Safety:  -Group instruction provided by PowerPoint, verbal  discussion, and written material to support subject matter. There is an overview of "What is Oxygen" and "Why do we need it".  Instructor also reviews how to create a safe environment for oxygen use, the importance of using oxygen as prescribed, and the risks of noncompliance. There is a brief discussion on traveling with oxygen and resources the patient may utilize.   PULMONARY REHAB CHRONIC OBSTRUCTIVE PULMONARY DISEASE from 04/15/2017 in [place]  Date  04/15/17  Educator  RN  Instruction Review Code  2- Demonstrated Understanding      Oxygen Equipment:  -Group instruction provided by [facility] Staff utilizing handouts, written materials, and equipment demonstrations.   Signs and Symptoms:  -  Group instruction provided by written material and verbal discussion to support subject matter. Warning signs and symptoms of infection, stroke, and heart attack are reviewed and when to call the physician/911 reinforced. Tips for preventing the spread of infection discussed.   Advanced Directives:  -Group instruction provided by verbal instruction and written material to support subject matter. Instructor reviews Advanced Directive laws and proper instruction for filling out document.   Pulmonary Video:  -Group video education that reviews the importance of medication and oxygen compliance, exercise, good nutrition, pulmonary hygiene, and pursed lip and diaphragmatic breathing for the pulmonary patient.   Exercise for the Pulmonary Patient:  -Group instruction that is supported by a PowerPoint presentation. Instructor discusses benefits of exercise, core components of exercise, frequency, duration, and intensity of an exercise routine, importance of utilizing pulse oximetry during exercise, safety while exercising, and options of places to exercise outside of rehab.     Pulmonary Medications:  -Verbally interactive group education provided by instructor with  focus on inhaled medications and proper administration.   Anatomy and Physiology of the Respiratory System and Intimacy:  -Group instruction provided by PowerPoint, verbal discussion, and written material to support subject matter. Instructor reviews respiratory cycle and anatomical components of the respiratory system and their functions. Instructor also reviews differences in obstructive and restrictive respiratory diseases with examples of each. Intimacy, Sex, and Sexuality differences are reviewed with a discussion on how relationships can change when diagnosed with pulmonary disease. Common sexual concerns are reviewed.   MD DAY -A group question and answer session with a medical doctor that allows participants to ask questions that relate to their pulmonary disease state.   OTHER EDUCATION -Group or individual verbal, written, or video instructions that support the educational goals of the pulmonary rehab program.   Holiday Eating Survival Tips:  -Group instruction provided by PowerPoint slides, verbal discussion, and written materials to support subject matter. The instructor gives patients tips, tricks, and techniques to help them not only survive but enjoy the holidays despite the onslaught of food that accompanies the holidays.   Knowledge Questionnaire Score: Knowledge Questionnaire Score - 03/31/17 1510      Knowledge Questionnaire Score   Pre Score  16/18       Core Components/Risk Factors/Patient Goals at Admission: Personal Goals and Risk Factors at Admission - 03/29/17 1306      Core Components/Risk Factors/Patient Goals on Admission    Weight Management  Obesity;Weight Loss    Improve shortness of breath with ADL's  Yes    Intervention  Provide education, individualized exercise plan and daily activity instruction to help decrease symptoms of SOB with activities of daily living.    Expected Outcomes  Short Term: Improve cardiorespiratory fitness to achieve a  reduction of symptoms when performing ADLs;Long Term: Be able to perform more ADLs without symptoms or delay the onset of symptoms    Heart Failure  Yes    Intervention  Provide a combined exercise and nutrition program that is supplemented with education, support and counseling about heart failure. Directed toward relieving symptoms such as shortness of breath, decreased exercise tolerance, and extremity edema.    Expected Outcomes  Improve functional capacity of life;Short term: Attendance in program 2-3 days a week with increased exercise capacity. Reported lower sodium intake. Reported increased fruit and vegetable intake. Reports medication compliance.;Short term: Daily weights obtained and reported for increase. Utilizing diuretic protocols set by physician.;Long term: Adoption of self-care skills and reduction of barriers for early signs  and symptoms recognition and intervention leading to self-care maintenance.    Stress  Yes    Intervention  Offer individual and/or small group education and counseling on adjustment to heart disease, stress management and health-related lifestyle change. Teach and support self-help strategies.;Refer participants experiencing significant psychosocial distress to appropriate mental health specialists for further evaluation and treatment. When possible, include family members and significant others in education/counseling sessions.    Expected Outcomes  Short Term: Participant demonstrates changes in health-related behavior, relaxation and other stress management skills, ability to obtain effective social support, and compliance with psychotropic medications if prescribed.;Long Term: Emotional wellbeing is indicated by absence of clinically significant psychosocial distress or social isolation.       Core Components/Risk Factors/Patient Goals Review:  Goals and Risk Factor Review    Row Name 04/19/17 0918             Core Components/Risk Factors/Patient Goals  Review   Personal Goals Review  Weight Management/Obesity;Improve shortness of breath with ADL's;Develop more efficient breathing techniques such as purse lipped breathing and diaphragmatic breathing and practicing self-pacing with activity.;Stress       Review  Just started program, has attended 3 exercise sessions, multiple health issues, chronic pain in her hips and back have limited her from walking on the track.  In hopes she can do the program to graduation, but afraid her chronic pain will drastically slow her progress.       Expected Outcomes  Slow progression d/t chronic pain issues.          Core Components/Risk Factors/Patient Goals at Discharge (Final Review):  Goals and Risk Factor Review - 04/19/17 0918      Core Components/Risk Factors/Patient Goals Review   Personal Goals Review  Weight Management/Obesity;Improve shortness of breath with ADL's;Develop more efficient breathing techniques such as purse lipped breathing and diaphragmatic breathing and practicing self-pacing with activity.;Stress    Review  Just started program, has attended 3 exercise sessions, multiple health issues, chronic pain in her hips and back have limited her from walking on the track.  In hopes she can do the program to graduation, but afraid her chronic pain will drastically slow her progress.    Expected Outcomes  Slow progression d/t chronic pain issues.       ITP Comments:   Comments: ITP REVIEW Pt is making expected progress toward pulmonary rehab goals after completing 3 sessions. Recommend continued exercise, life style modification, education, and utilization of breathing techniques to increase stamina and strength and decrease shortness of breath with exertion.

## 2017-04-20 ENCOUNTER — Encounter (HOSPITAL_COMMUNITY)
Admission: RE | Admit: 2017-04-20 | Discharge: 2017-04-20 | Disposition: A | Discharge status: Home / Self Care | Payer: Medicare HMO | Source: Ambulatory Visit | Attending: Pulmonary Disease | Admitting: Pulmonary Disease

## 2017-04-20 ENCOUNTER — Other Ambulatory Visit: Payer: Self-pay | Admitting: Adult Health

## 2017-04-20 VITALS — Wt 273.1 lb

## 2017-04-20 DIAGNOSIS — J449 Chronic obstructive pulmonary disease, unspecified: Secondary | ICD-10-CM

## 2017-04-20 DIAGNOSIS — J45902 Unspecified asthma with status asthmaticus: Secondary | ICD-10-CM

## 2017-04-20 NOTE — Progress Notes (Addendum)
Daily Session Note  Patient Details  Name: Kathleen Hatfield MRN: [id number] Date of Birth: 01-Apr-1955 Referring Provider:     Pulmonary Rehab Walk Test from 04/01/2017 in [place]  Referring Provider  Dr. Halford Chessman      Encounter Date: 04/20/2017  Check In: Session Check In - 04/20/17 1323      Check-In   Location  [facility]    Staff Present  Su Hilt, MS, ACSM RCEP, Exercise Physiologist;Sriman Tally Rollene Rotunda, RN, BSN;Lisa Ysidro Evert, RN;Asjah Leonia Reeves, RN, BSN    Supervising physician immediately available to respond to emergencies  [facility] immediately available    Physician(s)  Dr. Lonny Prude    Medication changes reported      No    Fall or balance concerns reported     No    Tobacco Cessation  No Change    Warm-up and Cool-down  Performed as group-led instruction    Resistance Training Performed  Yes    VAD Patient?  No      Pain Assessment   Currently in Pain?  No/denies    Multiple Pain Sites  No       Capillary Blood Glucose: No results found for this or any previous visit (from the past 24 hour(s)).    Social History   Tobacco Use  Smoking Status Never Smoker  Smokeless Tobacco Never Used    Goals Met:  Queuing for purse lip breathing No report of cardiac concerns or symptoms Strength training completed today  Goals Unmet:  Patient c/o midsternal esophageal spasm pain 10/10. Patient has been cleared by cardiology to have clean coronaries  Comments: Service time is from 1330 to 1510   Dr. Rush Farmer is Medical Director for Pulmonary Rehab at [facility].

## 2017-04-20 NOTE — Telephone Encounter (Signed)
Called and spoke with pt who stated to me when she had the ONO done, it was done with her wearing the CPAP.  Dr. Halford Chessman, please advise on this for pt and when you want pt to come in for a f/u OV.  Thanks!

## 2017-04-21 NOTE — Telephone Encounter (Signed)
Then please let her know that the ONO shows good control of her oxygen level at night with CPAP.  No change to current set up needed.

## 2017-04-21 NOTE — Telephone Encounter (Signed)
Patient called back requesting refills on advair inhaler. Refills sent to correct pharmacy nothing further needed.

## 2017-04-21 NOTE — Telephone Encounter (Signed)
Called and advised patient of this. Nothing further needed.

## 2017-04-21 NOTE — Telephone Encounter (Signed)
Called and spoke with patients husband, he will have patient call back once she wakes up.

## 2017-04-22 ENCOUNTER — Encounter (HOSPITAL_COMMUNITY): Payer: Medicare HMO

## 2017-04-22 ENCOUNTER — Ambulatory Visit (HOSPITAL_COMMUNITY): Payer: Self-pay | Admitting: *Deleted

## 2017-04-27 ENCOUNTER — Encounter (HOSPITAL_COMMUNITY): Payer: Medicare HMO

## 2017-04-29 ENCOUNTER — Encounter (HOSPITAL_COMMUNITY): Payer: Medicare HMO

## 2017-05-03 ENCOUNTER — Other Ambulatory Visit: Payer: Self-pay

## 2017-05-03 ENCOUNTER — Encounter (HOSPITAL_COMMUNITY): Payer: Self-pay

## 2017-05-03 ENCOUNTER — Emergency Department (HOSPITAL_COMMUNITY): Payer: Medicare HMO

## 2017-05-03 ENCOUNTER — Inpatient Hospital Stay (HOSPITAL_COMMUNITY)
Admission: EM | Admit: 2017-05-03 | Discharge: 2017-05-06 | DRG: 190 | Disposition: A | Discharge status: Home / Self Care | Payer: Medicare HMO | Attending: Internal Medicine | Admitting: Internal Medicine

## 2017-05-03 DIAGNOSIS — Z882 Allergy status to sulfonamides status: Secondary | ICD-10-CM

## 2017-05-03 DIAGNOSIS — Z888 Allergy status to other drugs, medicaments and biological substances status: Secondary | ICD-10-CM

## 2017-05-03 DIAGNOSIS — T380X5A Adverse effect of glucocorticoids and synthetic analogues, initial encounter: Secondary | ICD-10-CM | POA: Diagnosis present

## 2017-05-03 DIAGNOSIS — N189 Chronic kidney disease, unspecified: Secondary | ICD-10-CM | POA: Diagnosis not present

## 2017-05-03 DIAGNOSIS — J9601 Acute respiratory failure with hypoxia: Secondary | ICD-10-CM | POA: Diagnosis present

## 2017-05-03 DIAGNOSIS — K219 Gastro-esophageal reflux disease without esophagitis: Secondary | ICD-10-CM | POA: Diagnosis present

## 2017-05-03 DIAGNOSIS — E861 Hypovolemia: Secondary | ICD-10-CM | POA: Diagnosis present

## 2017-05-03 DIAGNOSIS — D72829 Elevated white blood cell count, unspecified: Secondary | ICD-10-CM | POA: Diagnosis present

## 2017-05-03 DIAGNOSIS — Z794 Long term (current) use of insulin: Secondary | ICD-10-CM | POA: Diagnosis not present

## 2017-05-03 DIAGNOSIS — I5032 Chronic diastolic (congestive) heart failure: Secondary | ICD-10-CM | POA: Diagnosis present

## 2017-05-03 DIAGNOSIS — Z8249 Family history of ischemic heart disease and other diseases of the circulatory system: Secondary | ICD-10-CM

## 2017-05-03 DIAGNOSIS — N183 Chronic kidney disease, stage 3 (moderate): Secondary | ICD-10-CM | POA: Diagnosis present

## 2017-05-03 DIAGNOSIS — Z885 Allergy status to narcotic agent status: Secondary | ICD-10-CM

## 2017-05-03 DIAGNOSIS — Z6841 Body Mass Index (BMI) 40.0 and over, adult: Secondary | ICD-10-CM

## 2017-05-03 DIAGNOSIS — Z9049 Acquired absence of other specified parts of digestive tract: Secondary | ICD-10-CM

## 2017-05-03 DIAGNOSIS — N182 Chronic kidney disease, stage 2 (mild): Secondary | ICD-10-CM | POA: Diagnosis present

## 2017-05-03 DIAGNOSIS — Z79890 Hormone replacement therapy (postmenopausal): Secondary | ICD-10-CM

## 2017-05-03 DIAGNOSIS — Z90710 Acquired absence of both cervix and uterus: Secondary | ICD-10-CM

## 2017-05-03 DIAGNOSIS — G4733 Obstructive sleep apnea (adult) (pediatric): Secondary | ICD-10-CM | POA: Diagnosis not present

## 2017-05-03 DIAGNOSIS — Z881 Allergy status to other antibiotic agents status: Secondary | ICD-10-CM

## 2017-05-03 DIAGNOSIS — R0602 Shortness of breath: Secondary | ICD-10-CM | POA: Diagnosis not present

## 2017-05-03 DIAGNOSIS — N179 Acute kidney failure, unspecified: Secondary | ICD-10-CM | POA: Diagnosis not present

## 2017-05-03 DIAGNOSIS — E1122 Type 2 diabetes mellitus with diabetic chronic kidney disease: Secondary | ICD-10-CM | POA: Diagnosis present

## 2017-05-03 DIAGNOSIS — E1142 Type 2 diabetes mellitus with diabetic polyneuropathy: Secondary | ICD-10-CM | POA: Diagnosis present

## 2017-05-03 DIAGNOSIS — Z91018 Allergy to other foods: Secondary | ICD-10-CM

## 2017-05-03 DIAGNOSIS — E119 Type 2 diabetes mellitus without complications: Secondary | ICD-10-CM | POA: Diagnosis not present

## 2017-05-03 DIAGNOSIS — I130 Hypertensive heart and chronic kidney disease with heart failure and stage 1 through stage 4 chronic kidney disease, or unspecified chronic kidney disease: Secondary | ICD-10-CM | POA: Diagnosis present

## 2017-05-03 DIAGNOSIS — J441 Chronic obstructive pulmonary disease with (acute) exacerbation: Principal | ICD-10-CM | POA: Diagnosis present

## 2017-05-03 DIAGNOSIS — E875 Hyperkalemia: Secondary | ICD-10-CM | POA: Diagnosis present

## 2017-05-03 DIAGNOSIS — I5030 Unspecified diastolic (congestive) heart failure: Secondary | ICD-10-CM | POA: Diagnosis present

## 2017-05-03 DIAGNOSIS — Z7984 Long term (current) use of oral hypoglycemic drugs: Secondary | ICD-10-CM

## 2017-05-03 DIAGNOSIS — Z825 Family history of asthma and other chronic lower respiratory diseases: Secondary | ICD-10-CM

## 2017-05-03 DIAGNOSIS — E785 Hyperlipidemia, unspecified: Secondary | ICD-10-CM | POA: Diagnosis present

## 2017-05-03 DIAGNOSIS — N289 Disorder of kidney and ureter, unspecified: Secondary | ICD-10-CM | POA: Diagnosis present

## 2017-05-03 LAB — CBC
HCT: 40.1 % (ref 36.0–46.0)
Hemoglobin: 13.1 g/dL (ref 12.0–15.0)
MCH: 26.7 pg (ref 26.0–34.0)
MCHC: 32.7 g/dL (ref 30.0–36.0)
MCV: 81.8 fL (ref 78.0–100.0)
Platelets: 443 10*3/uL — ABNORMAL HIGH (ref 150–400)
RBC: 4.9 MIL/uL (ref 3.87–5.11)
RDW: 16.3 % — ABNORMAL HIGH (ref 11.5–15.5)
WBC: 20.1 10*3/uL — ABNORMAL HIGH (ref 4.0–10.5)

## 2017-05-03 LAB — BASIC METABOLIC PANEL
Anion gap: 13 (ref 5–15)
BUN: 17 mg/dL (ref 6–20)
CO2: 27 mmol/L (ref 22–32)
Calcium: 9.8 mg/dL (ref 8.9–10.3)
Chloride: 94 mmol/L — ABNORMAL LOW (ref 101–111)
Creatinine, Ser: 1.45 mg/dL — ABNORMAL HIGH (ref 0.44–1.00)
GFR calc Af Amer: 44 mL/min — ABNORMAL LOW (ref >60)
GFR calc non Af Amer: 38 mL/min — ABNORMAL LOW (ref >60)
Glucose, Bld: 174 mg/dL — ABNORMAL HIGH (ref 65–99)
Potassium: 3.7 mmol/L (ref 3.5–5.1)
Sodium: 134 mmol/L — ABNORMAL LOW (ref 135–145)

## 2017-05-03 MED ORDER — PREDNISONE 20 MG PO TABS
60.0000 mg | ORAL_TABLET | Freq: Once | ORAL | Status: AC
  Administered 2017-05-03: 60 mg via ORAL
  Filled 2017-05-03: qty 3

## 2017-05-03 MED ORDER — ALBUTEROL SULFATE (2.5 MG/3ML) 0.083% IN NEBU
5.0000 mg | INHALATION_SOLUTION | Freq: Once | RESPIRATORY_TRACT | Status: AC
  Administered 2017-05-03: 5 mg via RESPIRATORY_TRACT

## 2017-05-03 MED ORDER — ALBUTEROL SULFATE (2.5 MG/3ML) 0.083% IN NEBU
INHALATION_SOLUTION | RESPIRATORY_TRACT | Status: AC
  Filled 2017-05-03: qty 6

## 2017-05-03 MED ORDER — ALBUTEROL (5 MG/ML) CONTINUOUS INHALATION SOLN
10.0000 mg/h | INHALATION_SOLUTION | Freq: Once | RESPIRATORY_TRACT | Status: AC
Start: 2017-05-03 — End: 2017-05-03
  Administered 2017-05-03: 10 mg/h via RESPIRATORY_TRACT
  Filled 2017-05-03: qty 20

## 2017-05-03 NOTE — ED Notes (Signed)
Pt. Stated, that's a lot better for breathing

## 2017-05-03 NOTE — ED Notes (Signed)
Pt. C/o difficult with breathing in and out. Pt. Had a breathing treatment when she 1st arrived.

## 2017-05-03 NOTE — ED Provider Notes (Signed)
[place] EMERGENCY DEPARTMENT Provider Note   CSN: [id number] Arrival date & time: 05/03/17  1252     History   Chief Complaint Chief Complaint  Patient presents with  . Shortness of Breath    HPI Kathleen Hatfield is a 62 y.o. female.  HPI Patient presented to the emergency room for evaluation of shortness of breath.  Patient has a history of COPD associated with asthma.  She does not smoke.  Patient states she was diagnosed with influenza recently.  She tried taking prophylactic Tamiflu but that did not seem to help.  She has been coughing frequently.  Over the last 5 days she has been bringing up yellow sputum.  She also started to feel more short of breath.  The patient feels like she is wheezing.  She has a history of a bad bout of influenza associated with a COPD exacerbation where she was admitted and started on BiPAP.  Patient was concerned she was going to get that bad so she came into the emergency room to be evaluated.  She denies any fevers recently.  No leg swelling. Past Medical History:  Diagnosis Date  . Anemia   . Arthritis   . Asthma   . CHF (congestive heart failure) ([place]) 05/2014  . Chronic diastolic heart failure ([place])   . CKD (chronic kidney disease)   . Depression   . Diabetes mellitus without complication ([place])   . Diverticulitis   . Dyspnea    with exertion  . GERD (gastroesophageal reflux disease)   . History of blood transfusion   . Hyperlipidemia    cannot tolerate statins  . Hypertension   . Peripheral neuropathy   . Pneumonia   . PONV (postoperative nausea and vomiting)   . Sleep apnea    uses CPAP  . Ulcerative colitis [facility])    dr Collene Mares    Patient Active Problem List   Diagnosis Date Noted  . Influenza A   . Renal insufficiency   . Acute hypoxemic respiratory failure ([place]) 04/03/2016  . Acute bronchitis 04/02/2016  . Acute renal failure superimposed on stage 2 chronic kidney disease ([place]) 04/02/2016  . Respiratory  distress 04/02/2016  . Diabetes mellitus without complication ([place])   . CKD (chronic kidney disease)   . Stage 2 chronic kidney disease   . Solitary pulmonary nodule 11/20/2015  . Urinary retention 11/18/2015  . Hypoxemia 11/18/2015  . Incisional hernia s/p lap repair with mesh 11/15/2015 11/15/2015  . Hypersomnia with sleep apnea 09/16/2015  . Fatigue 06/07/2015  . Morbid obesity ([place]) 06/07/2015  . Cough 04/18/2015  . Folliculitis 25/42/7062  . Chronic diastolic heart failure ([place])   . OSA (obstructive sleep apnea) 08/23/2012  . Hypertension   . Depression   . GERD (gastroesophageal reflux disease)   . Hyperlipidemia   . Mild persistent chronic asthma without complication   . Dyspnea   . Ulcerative colitis ([place])   . Patellar tendinitis 05/25/2011  . Knee pain 05/25/2011  . Myofascial pain 05/25/2011  . Cervicalgia 05/25/2011    Past Surgical History:  Procedure Laterality Date  . ABDOMINAL HYSTERECTOMY    . APPENDECTOMY    . BREAST SURGERY     left biopsy  . CARDIAC CATHETERIZATION    . CESAREAN SECTION    . CHOLECYSTECTOMY    . HERNIA REPAIR    . INSERTION OF MESH N/A 11/15/2015   Procedure: INSERTION OF MESH;  Surgeon: [name], MD;  Location: [facility] ORS;  Service: General;  Laterality: N/A;  . LAPAROSCOPIC LYSIS OF ADHESIONS N/A 11/15/2015   Procedure: LAPAROSCOPIC LYSIS OF ADHESIONS;  Surgeon: [name], MD;  Location: [facility] ORS;  Service: General;  Laterality: N/A;  . RIGHT HEART CATHETERIZATION N/A 02/27/2013   Procedure: RIGHT HEART CATH;  Surgeon: Blane Ohara, MD;  Location: [facility] CATH LAB;  Service: Cardiovascular;  Laterality: N/A;  . RIGHT HEART CATHETERIZATION N/A 12/14/2013   Procedure: RIGHT HEART CATH;  Surgeon: Larey Dresser, MD;  Location: [facility] CATH LAB;  Service: Cardiovascular;  Laterality: N/A;  . SIGMOIDECTOMY  2010   diverticular disease  . VENTRAL HERNIA REPAIR N/A 11/15/2015   Procedure: LAPAROSCOPIC VENTRAL WALL HERNIA REPAIR;  Surgeon: [name], MD;  Location: [facility] ORS;  Service: General;  Laterality: N/A;    OB History    No data available       Home Medications    Prior to Admission medications   Medication Sig Start Date End Date Taking? Authorizing Provider  acetaminophen (TYLENOL) 500 MG tablet Take 500 mg by mouth every 4 (four) hours as needed (pain).     [provider]  ADVAIR HFA 115-21 MCG/ACT inhaler INHALE 2 PUFFS INTO THE LUNGS 2 TIMES DAILY. 04/20/17   [name], MD  albuterol (PROVENTIL) (2.5 MG/3ML) 0.083% nebulizer solution Take 3 mLs (2.5 mg total) by nebulization every 4 (four) hours as needed for wheezing. 08/26/15   Tanda Rockers, MD  Alum Hydroxide-Mag Carbonate (GAVISCON PO) Take 2 tablets by mouth at bedtime as needed (reflux).     [provider]  baclofen (LIORESAL) 5 mg TABS tablet Take 2.5 mg by mouth 2 (two) times daily as needed for muscle spasms.  03/27/13   Dixon Boos, MD  BAYER CONTOUR NEXT TEST test strip As directed 07/30/15   [provider]  Cholecalciferol (VITAMIN D-3) 5000 UNITS TABS Take 5,000 Units by mouth daily.     [provider]  DULoxetine (CYMBALTA) 60 MG capsule Take 60 mg by mouth 2 (two) times daily.     [provider]  estradiol (ESTRACE) 2 MG tablet Take 1 tablet (2 mg total) by mouth daily. 07/29/11   Delsa Bern, MD  fenofibrate (TRICOR) 145 MG tablet Take 145 mg by mouth at bedtime.  09/28/14   [provider]  folic acid (FOLVITE) 1 MG tablet Take 1 mg by mouth 2 (two) times daily.    [provider]  furosemide (LASIX) 20 MG tablet Take 10 mg by mouth daily. Take a whole tablet (14m) by mouth once daily if needed for increased swelling    [provider]  gabapentin (NEURONTIN) 100 MG capsule Take 400 mg by mouth at bedtime.     [provider]  GLOBAL EASE INJECT PEN NEEDLES 32G X 4 MM MISC  06/19/15   [provider]  ibuprofen (ADVIL,MOTRIN) 200 MG tablet Take 200  mg by mouth every 6 (six) hours as needed (for pain).    [provider]  INVOKANA 100 MG TABS tablet Take 100 mg by mouth daily.  01/19/17   [provider]  Liraglutide (VICTOZA) 18 MG/3ML SOPN Inject 1.2 mg into the skin every evening.     [provider]  MICROLET LANCETS MWalfordAs directed 07/30/15   [provider]  ondansetron (ZOFRAN ODT) 4 MG disintegrating tablet Take 1 tablet (4 mg total) by mouth every 8 (eight) hours as needed for nausea or vomiting. 04/01/16   Ward, KCyril Mourning  N, DO  pantoprazole (PROTONIX) 40 MG tablet Take 40 mg by mouth daily.  04/14/12   [provider]  PROAIR HFA 108 (90 Base) MCG/ACT inhaler INHALE 2 PUFFS INTO THE LUNGS EVERY FOUR HOURS AS NEEDED FOR WHEEZING. 11/21/15   Tanda Rockers, MD  rOPINIRole (REQUIP) 4 MG tablet Take 4 mg by mouth 2 (two) times daily. MIDDAY AND BEDTIME    [provider]  rosuvastatin (CRESTOR) 10 MG tablet Take 10 mg by mouth once a week.  03/25/15   [provider]  Simethicone (GAS-X ULTRA STRENGTH) 180 MG CAPS Take 2 capsules by mouth daily as needed (for gas).    [provider]  spironolactone (ALDACTONE) 25 MG tablet Take 25 mg by mouth daily. 02/18/17   [provider]  TOUJEO SOLOSTAR 300 UNIT/ML SOPN Inject 70 Units into the skin daily.  02/03/17   [provider]  traMADol (ULTRAM) 50 MG tablet Take 1.5-2 tablets (75-100 mg total) by mouth every 6 (six) hours as needed for severe pain. 11/15/15   [name], MD  umeclidinium bromide (INCRUSE ELLIPTA) 62.5 MCG/INH AEPB Inhale 1 puff into the lungs daily. Patient not taking: Reported on 03/29/2017 02/17/17   [name], MD  UNABLE TO FIND CPAP at bedtime    [provider]    Family History Family History  Problem Relation Age of Onset  . Heart disease Mother   . Hypertension Mother   . Dementia Mother   . Heart disease Father   . COPD Father   . Hypertension Father      Social History Social History   Tobacco Use  . Smoking status: Never Smoker  . Smokeless tobacco: Never Used  Substance Use Topics  . Alcohol use: No  . Drug use: No     Allergies   Almond oil; Morphine and related; Ceclor [cefaclor]; Ciprofloxacin; Levaquin [levofloxacin]; and Sulfa antibiotics   Review of Systems Review of Systems  All other systems reviewed and are negative.    Physical Exam Updated Vital Signs BP 138/76 (BP Location: Right Arm)   Pulse 95   Temp 98.6 F (37 C) (Oral)   Resp 18   SpO2 94%   Physical Exam  Constitutional: She appears well-developed and well-nourished. No distress.  Obese  HENT:  Head: Normocephalic and atraumatic.  Right Ear: External ear normal.  Left Ear: External ear normal.  Eyes: Conjunctivae are normal. Right eye exhibits no discharge. Left eye exhibits no discharge. No scleral icterus.  Neck: Neck supple. No tracheal deviation present.  Cardiovascular: Normal rate, regular rhythm and intact distal pulses.  Pulmonary/Chest: Effort normal. No accessory muscle usage or stridor. No tachypnea. No respiratory distress. She has wheezes. She has no rales.  Patient is able to speak in full sentences  Abdominal: Soft. Bowel sounds are normal. She exhibits no distension. There is no tenderness. There is no rebound and no guarding.  Musculoskeletal: She exhibits no edema or tenderness.  Neurological: She is alert. She has normal strength. No cranial nerve deficit (no facial droop, extraocular movements intact, no slurred speech) or sensory deficit. She exhibits normal muscle tone. She displays no seizure activity. Coordination normal.  Skin: Skin is warm and dry. No rash noted.  Psychiatric: She has a normal mood and affect.  Nursing note and vitals reviewed.    ED Treatments / Results  Labs (all labs ordered are listed, but only abnormal results are displayed) Labs Reviewed  CBC  BASIC METABOLIC PANEL  EKG  EKG  Interpretation  Date/Time:  Monday May 03 2017 13:06:13 EST Ventricular Rate:  91 PR Interval:  192 QRS Duration: 104 QT Interval:  374 QTC Calculation: 460 R Axis:   -49 Text Interpretation:  Normal sinus rhythm Left axis deviation Incomplete right bundle branch block Cannot rule out Anterior infarct , age undetermined Abnormal ECG No significant change since last tracing Confirmed by Dorie Rank [phone]) on 05/03/2017 9:23:00 PM       Radiology Dg Chest 2 View  Result Date: 05/03/2017 CLINICAL DATA:  Worsening shortness of breath. EXAM: CHEST  2 VIEW COMPARISON:  09/12/2016. FINDINGS: Lungs are hyperexpanded. Interstitial markings are diffusely coarsened with chronic features. Cardiopericardial silhouette is at upper limits of normal for size. The visualized bony structures of the thorax are intact. IMPRESSION: Stable.  Hyperexpansion without acute cardiopulmonary findings. Electronically Signed   By: Misty Stanley M.D.   On: 05/03/2017 13:43    Procedures Procedures (including critical care time)  Medications Ordered in ED Medications  albuterol (PROVENTIL) (2.5 MG/3ML) 0.083% nebulizer solution (not administered)  albuterol (PROVENTIL,VENTOLIN) solution continuous neb (not administered)  albuterol (PROVENTIL) (2.5 MG/3ML) 0.083% nebulizer solution 5 mg (5 mg Nebulization Given 05/03/17 1736)  predniSONE (DELTASONE) tablet 60 mg (60 mg Oral Given 05/03/17 2146)     Initial Impression / Assessment and Plan / ED Course  I have reviewed the triage vital signs and the nursing notes.  Pertinent labs & imaging results that were available during my care of the patient were reviewed by me and considered in my medical decision making (see chart for details).  Clinical Course as of May 03 2144  Mon May 03, 2017  2131 Chest x-ray does not show pneumonia.  Patient does have wheezing on exam.  Will start her on a breathing treatment and give her a dose of steroids.  With her history of  having kidney injury when she was admitted to the hospital last time I will check her CBC and BMP  [JK]    Clinical Course User Index [JK] Dorie Rank, MD   Will turn over case to the oncoming provider to follow up on lab tests and reassess after breathing treatments   Final Clinical Impressions(s) / ED Diagnoses   Final diagnoses:  COPD exacerbation [facility])      Dorie Rank, MD 05/03/17 2149

## 2017-05-03 NOTE — ED Triage Notes (Signed)
Pt presents to the ed for having increasing shortness of breath after having the flu x 5 days.  The patient has a productive cough. Denies any other symptoms.

## 2017-05-03 NOTE — ED Notes (Signed)
Patient uses CPAP every time she lays down. Oxygen usually runs around 93%-94% RA.

## 2017-05-03 NOTE — ED Notes (Signed)
91% O2 sats. Pt. Is wearing a standard precaution mask.

## 2017-05-03 NOTE — ED Provider Notes (Signed)
Care assumed from previous provider. Please see note for further details. Case discussed, plan agreed upon. Briefly, patient is a 62 y.o. female who presents to ER for shortness of breath. Hx of asthma. Did have flu exposure, but has been taking prophylactic tamiflu already. CXR without acute findings. Will follow up on labs and re-evaluate following neb treatment.   Labs reviewed with leukocytosis of 20.1.  Slight bump in baseline creatinine of 1.45.  I helped assist patient ambulating.  She became very dyspneic and had to stop after just a few steps.  O2 did drop to 87%.  She does not have an oxygen dependency at home.  Hospitalist consulted who will admit.    Ward, Ozella Almond, PA-C 05/04/17 9509    Orpah Greek, MD 05/04/17 [phone]

## 2017-05-04 ENCOUNTER — Encounter (HOSPITAL_COMMUNITY): Payer: Self-pay | Admitting: Family Medicine

## 2017-05-04 ENCOUNTER — Encounter (HOSPITAL_COMMUNITY): Payer: Medicare HMO

## 2017-05-04 DIAGNOSIS — E1165 Type 2 diabetes mellitus with hyperglycemia: Secondary | ICD-10-CM | POA: Insufficient documentation

## 2017-05-04 DIAGNOSIS — N182 Chronic kidney disease, stage 2 (mild): Secondary | ICD-10-CM

## 2017-05-04 DIAGNOSIS — E119 Type 2 diabetes mellitus without complications: Secondary | ICD-10-CM | POA: Insufficient documentation

## 2017-05-04 DIAGNOSIS — J441 Chronic obstructive pulmonary disease with (acute) exacerbation: Principal | ICD-10-CM

## 2017-05-04 DIAGNOSIS — Z794 Long term (current) use of insulin: Secondary | ICD-10-CM

## 2017-05-04 DIAGNOSIS — I5032 Chronic diastolic (congestive) heart failure: Secondary | ICD-10-CM | POA: Diagnosis not present

## 2017-05-04 LAB — HEMOGLOBIN A1C
Hgb A1c MFr Bld: 8.5 % — ABNORMAL HIGH (ref 4.8–5.6)
Mean Plasma Glucose: 197.25 mg/dL

## 2017-05-04 LAB — BASIC METABOLIC PANEL
Anion gap: 15 (ref 5–15)
BUN: 17 mg/dL (ref 6–20)
CO2: 25 mmol/L (ref 22–32)
Calcium: 9.8 mg/dL (ref 8.9–10.3)
Chloride: 97 mmol/L — ABNORMAL LOW (ref 101–111)
Creatinine, Ser: 1.65 mg/dL — ABNORMAL HIGH (ref 0.44–1.00)
GFR calc Af Amer: 37 mL/min — ABNORMAL LOW (ref >60)
GFR calc non Af Amer: 32 mL/min — ABNORMAL LOW (ref >60)
Glucose, Bld: 332 mg/dL — ABNORMAL HIGH (ref 65–99)
Potassium: 5 mmol/L (ref 3.5–5.1)
Sodium: 137 mmol/L (ref 135–145)

## 2017-05-04 LAB — CBG MONITORING, ED
Glucose-Capillary: 286 mg/dL — ABNORMAL HIGH (ref 65–99)
Glucose-Capillary: 378 mg/dL — ABNORMAL HIGH (ref 65–99)
Glucose-Capillary: 332 mg/dL — ABNORMAL HIGH (ref 65–99)
Glucose-Capillary: 281 mg/dL — ABNORMAL HIGH (ref 65–99)
Glucose-Capillary: 323 mg/dL — ABNORMAL HIGH (ref 65–99)

## 2017-05-04 LAB — CBC WITH DIFFERENTIAL/PLATELET
Basophils Absolute: 0 10*3/uL (ref 0.0–0.1)
Basophils Relative: 0 %
Eosinophils Absolute: 0 10*3/uL (ref 0.0–0.7)
Eosinophils Relative: 0 %
HCT: 37.3 % (ref 36.0–46.0)
Hemoglobin: 12 g/dL (ref 12.0–15.0)
Lymphocytes Relative: 8 %
Lymphs Abs: 1.6 10*3/uL (ref 0.7–4.0)
MCH: 26.5 pg (ref 26.0–34.0)
MCHC: 32.2 g/dL (ref 30.0–36.0)
MCV: 82.3 fL (ref 78.0–100.0)
Monocytes Absolute: 0.5 10*3/uL (ref 0.1–1.0)
Monocytes Relative: 2 %
Neutro Abs: 19.2 10*3/uL — ABNORMAL HIGH (ref 1.7–7.7)
Neutrophils Relative %: 90 %
Platelets: 498 10*3/uL — ABNORMAL HIGH (ref 150–400)
RBC: 4.53 MIL/uL (ref 3.87–5.11)
RDW: 16.4 % — ABNORMAL HIGH (ref 11.5–15.5)
WBC: 21.4 10*3/uL — ABNORMAL HIGH (ref 4.0–10.5)

## 2017-05-04 LAB — HIV ANTIBODY (ROUTINE TESTING W REFLEX): HIV Screen 4th Generation wRfx: NONREACTIVE

## 2017-05-04 MED ORDER — PANTOPRAZOLE SODIUM 40 MG PO TBEC
40.0000 mg | DELAYED_RELEASE_TABLET | Freq: Every day | ORAL | Status: DC
  Administered 2017-05-04 – 2017-05-06 (×3): 40 mg via ORAL
  Filled 2017-05-04 (×4): qty 1

## 2017-05-04 MED ORDER — FOLIC ACID 1 MG PO TABS
1.0000 mg | ORAL_TABLET | Freq: Two times a day (BID) | ORAL | Status: DC
  Administered 2017-05-04 – 2017-05-06 (×6): 1 mg via ORAL
  Filled 2017-05-04 (×6): qty 1

## 2017-05-04 MED ORDER — FENOFIBRATE 160 MG PO TABS
160.0000 mg | ORAL_TABLET | Freq: Every day | ORAL | Status: DC
  Administered 2017-05-04 – 2017-05-06 (×3): 160 mg via ORAL
  Filled 2017-05-04 (×3): qty 1

## 2017-05-04 MED ORDER — DULOXETINE HCL 60 MG PO CPEP
60.0000 mg | ORAL_CAPSULE | Freq: Two times a day (BID) | ORAL | Status: DC
  Administered 2017-05-04 – 2017-05-06 (×6): 60 mg via ORAL
  Filled 2017-05-04 (×6): qty 1

## 2017-05-04 MED ORDER — GABAPENTIN 400 MG PO CAPS
400.0000 mg | ORAL_CAPSULE | Freq: Every day | ORAL | Status: DC
  Administered 2017-05-04 – 2017-05-05 (×3): 400 mg via ORAL
  Filled 2017-05-04 (×3): qty 1

## 2017-05-04 MED ORDER — TRAMADOL HCL 50 MG PO TABS: 50.0000 mg | ORAL_TABLET | Freq: Four times a day (QID) | ORAL | Status: DC | PRN

## 2017-05-04 MED ORDER — ACETAMINOPHEN 650 MG RE SUPP: 650.0000 mg | Freq: Four times a day (QID) | RECTAL | Status: DC | PRN

## 2017-05-04 MED ORDER — IPRATROPIUM-ALBUTEROL 0.5-2.5 (3) MG/3ML IN SOLN
3.0000 mL | Freq: Four times a day (QID) | RESPIRATORY_TRACT | Status: DC
  Administered 2017-05-04 – 2017-05-05 (×4): 3 mL via RESPIRATORY_TRACT
  Filled 2017-05-04 (×4): qty 3

## 2017-05-04 MED ORDER — VITAMIN D 1000 UNITS PO TABS
5000.0000 [IU] | ORAL_TABLET | Freq: Every day | ORAL | Status: DC
  Administered 2017-05-04 – 2017-05-06 (×3): 5000 [IU] via ORAL
  Filled 2017-05-04 (×4): qty 5

## 2017-05-04 MED ORDER — ONDANSETRON HCL 4 MG/2ML IJ SOLN: 4.0000 mg | Freq: Four times a day (QID) | INTRAMUSCULAR | Status: DC | PRN

## 2017-05-04 MED ORDER — INSULIN ASPART 100 UNIT/ML ~~LOC~~ SOLN
0.0000 [IU] | Freq: Three times a day (TID) | SUBCUTANEOUS | Status: DC
  Administered 2017-05-04: 11 [IU] via SUBCUTANEOUS
  Administered 2017-05-04: 15 [IU] via SUBCUTANEOUS
  Administered 2017-05-04: 8 [IU] via SUBCUTANEOUS
  Administered 2017-05-05: 11 [IU] via SUBCUTANEOUS
  Administered 2017-05-05: 5 [IU] via SUBCUTANEOUS
  Administered 2017-05-05: 8 [IU] via SUBCUTANEOUS
  Administered 2017-05-06: 5 [IU] via SUBCUTANEOUS
  Administered 2017-05-06: 8 [IU] via SUBCUTANEOUS
  Filled 2017-05-04 (×2): qty 1

## 2017-05-04 MED ORDER — ONDANSETRON HCL 4 MG PO TABS
4.0000 mg | ORAL_TABLET | Freq: Four times a day (QID) | ORAL | Status: DC | PRN
  Administered 2017-05-04: 4 mg via ORAL
  Filled 2017-05-04: qty 1

## 2017-05-04 MED ORDER — SODIUM CHLORIDE 0.9% FLUSH
3.0000 mL | Freq: Two times a day (BID) | INTRAVENOUS | Status: DC
  Administered 2017-05-04 – 2017-05-06 (×4): 3 mL via INTRAVENOUS

## 2017-05-04 MED ORDER — INSULIN GLARGINE 100 UNIT/ML ~~LOC~~ SOLN
40.0000 [IU] | Freq: Every day | SUBCUTANEOUS | Status: DC
  Administered 2017-05-04: 40 [IU] via SUBCUTANEOUS
  Filled 2017-05-04: qty 0.4

## 2017-05-04 MED ORDER — SODIUM CHLORIDE 0.9 % IV SOLN
500.0000 mg | INTRAVENOUS | Status: DC
  Administered 2017-05-04: 500 mg via INTRAVENOUS
  Filled 2017-05-04: qty 500

## 2017-05-04 MED ORDER — INSULIN GLARGINE 100 UNIT/ML ~~LOC~~ SOLN
30.0000 [IU] | SUBCUTANEOUS | Status: AC
  Administered 2017-05-04: 30 [IU] via SUBCUTANEOUS
  Filled 2017-05-04: qty 0.3

## 2017-05-04 MED ORDER — SODIUM CHLORIDE 0.9% FLUSH: 3.0000 mL | INTRAVENOUS | Status: DC | PRN

## 2017-05-04 MED ORDER — ROPINIROLE HCL 1 MG PO TABS
8.0000 mg | ORAL_TABLET | Freq: Every day | ORAL | Status: DC
  Administered 2017-05-04: 8 mg via ORAL
  Filled 2017-05-04: qty 8

## 2017-05-04 MED ORDER — HEPARIN SODIUM (PORCINE) 5000 UNIT/ML IJ SOLN
5000.0000 [IU] | Freq: Three times a day (TID) | INTRAMUSCULAR | Status: DC
  Administered 2017-05-04 – 2017-05-06 (×8): 5000 [IU] via SUBCUTANEOUS
  Filled 2017-05-04 (×8): qty 1

## 2017-05-04 MED ORDER — INSULIN ASPART 100 UNIT/ML ~~LOC~~ SOLN
0.0000 [IU] | Freq: Every day | SUBCUTANEOUS | Status: DC
  Administered 2017-05-04: 4 [IU] via SUBCUTANEOUS
  Administered 2017-05-04: 3 [IU] via SUBCUTANEOUS
  Administered 2017-05-05: 2 [IU] via SUBCUTANEOUS
  Filled 2017-05-04 (×2): qty 1

## 2017-05-04 MED ORDER — ALBUTEROL SULFATE (2.5 MG/3ML) 0.083% IN NEBU
2.5000 mg | INHALATION_SOLUTION | RESPIRATORY_TRACT | Status: DC | PRN
  Administered 2017-05-06: 2.5 mg via RESPIRATORY_TRACT
  Filled 2017-05-04: qty 3

## 2017-05-04 MED ORDER — SENNOSIDES-DOCUSATE SODIUM 8.6-50 MG PO TABS: 1.0000 | ORAL_TABLET | Freq: Every evening | ORAL | Status: DC | PRN

## 2017-05-04 MED ORDER — ROPINIROLE HCL 1 MG PO TABS
4.0000 mg | ORAL_TABLET | Freq: Two times a day (BID) | ORAL | Status: DC
  Administered 2017-05-04 – 2017-05-06 (×4): 4 mg via ORAL
  Filled 2017-05-04 (×4): qty 4

## 2017-05-04 MED ORDER — GABAPENTIN 100 MG PO CAPS
200.0000 mg | ORAL_CAPSULE | Freq: Every day | ORAL | Status: DC
  Administered 2017-05-04 – 2017-05-06 (×3): 200 mg via ORAL
  Filled 2017-05-04 (×3): qty 2

## 2017-05-04 MED ORDER — ACETAMINOPHEN 325 MG PO TABS
650.0000 mg | ORAL_TABLET | Freq: Four times a day (QID) | ORAL | Status: DC | PRN
  Administered 2017-05-04 – 2017-05-05 (×4): 650 mg via ORAL
  Filled 2017-05-04 (×4): qty 2

## 2017-05-04 MED ORDER — INSULIN GLARGINE 100 UNIT/ML ~~LOC~~ SOLN
70.0000 [IU] | Freq: Every day | SUBCUTANEOUS | Status: DC
  Administered 2017-05-05 – 2017-05-06 (×2): 70 [IU] via SUBCUTANEOUS
  Filled 2017-05-04 (×2): qty 0.7

## 2017-05-04 MED ORDER — AZITHROMYCIN 250 MG PO TABS
250.0000 mg | ORAL_TABLET | Freq: Every day | ORAL | Status: DC
  Administered 2017-05-05 – 2017-05-06 (×2): 250 mg via ORAL
  Filled 2017-05-04 (×2): qty 1

## 2017-05-04 MED ORDER — METHYLPREDNISOLONE SODIUM SUCC 40 MG IJ SOLR
40.0000 mg | Freq: Three times a day (TID) | INTRAMUSCULAR | Status: DC
Start: 2017-05-04 — End: 2017-05-05
  Administered 2017-05-04 – 2017-05-05 (×5): 40 mg via INTRAVENOUS
  Filled 2017-05-04 (×5): qty 1

## 2017-05-04 MED ORDER — ROPINIROLE HCL 1 MG PO TABS
4.0000 mg | ORAL_TABLET | Freq: Every day | ORAL | Status: DC
  Administered 2017-05-04: 4 mg via ORAL
  Filled 2017-05-04: qty 4

## 2017-05-04 MED ORDER — SODIUM CHLORIDE 0.9 % IV SOLN: 250.0000 mL | INTRAVENOUS | Status: DC | PRN

## 2017-05-04 NOTE — H&P (Signed)
History and Physical    Kathleen Hatfield [id number]:[id number] DOB: Jul 09, 1955 DOA: 05/03/2017  PCP: Myer Peer, MD   Patient coming from: Home  Chief Complaint: Cough, SOB, wheezing   HPI: Kathleen Hatfield is a 62 y.o. female with medical history significant for COPD, chronic diastolic CHF, chronic kidney disease stage II, and insulin-dependent diabetes mellitus, now presenting to the emergency department for evaluation of shortness of breath, cough, and wheezing.  Patient developed a flulike illness approximately 1 week ago, has since completed Tamiflu with resolution of many of her symptoms, but her shortness of breath and wheezing has continued to worsen.  She had been febrile early in the course of this illness, but not recently.  Denies chest pain or palpitations and denies any change in her chronic mild lower extremity edema.  ED Course: Upon arrival to the ED, patient is found to be afebrile, saturating low 90s on room air, tachypneic, slightly tachycardic, and with stable blood pressure.  EKG features a sinus rhythm with LAD chest x-ray is negative for acute findings.  Chemistry panel is notable for a creatinine of 1.45, up from 1.12 last summer.  CBC is notable for a leukocytosis to 20,100 and a mild thrombocytosis with platelets 443,000.  Patient was treated in the ED with nebulized albuterol and systemic steroid.  She has improved some, but continues to be dyspneic at rest and hypoxic with mild exertion.  She will be observed on the medical-surgical unit for ongoing evaluation and management of COPD with acute exacerbation.  Review of Systems:  All other systems reviewed and apart from HPI, are negative.  Past Medical History:  Diagnosis Date  . Anemia   . Arthritis   . Asthma   . CHF (congestive heart failure) ([place]) 05/2014  . Chronic diastolic heart failure ([place])   . CKD (chronic kidney disease)   . Depression   . Diabetes mellitus without complication ([place])   . Diverticulitis   .  Dyspnea    with exertion  . GERD (gastroesophageal reflux disease)   . History of blood transfusion   . Hyperlipidemia    cannot tolerate statins  . Hypertension   . Peripheral neuropathy   . Pneumonia   . PONV (postoperative nausea and vomiting)   . Sleep apnea    uses CPAP  . Ulcerative colitis ([place])    dr Collene Mares    Past Surgical History:  Procedure Laterality Date  . ABDOMINAL HYSTERECTOMY    . APPENDECTOMY    . BREAST SURGERY     left biopsy  . CARDIAC CATHETERIZATION    . CESAREAN SECTION    . CHOLECYSTECTOMY    . HERNIA REPAIR    . INSERTION OF MESH N/A 11/15/2015   Procedure: INSERTION OF MESH;  Surgeon: [name], MD;  Location: [facility] ORS;  Service: General;  Laterality: N/A;  . LAPAROSCOPIC LYSIS OF ADHESIONS N/A 11/15/2015   Procedure: LAPAROSCOPIC LYSIS OF ADHESIONS;  Surgeon: [name], MD;  Location: [facility];  Service: General;  Laterality: N/A;  . RIGHT HEART CATHETERIZATION N/A 02/27/2013   Procedure: RIGHT HEART CATH;  Surgeon: Blane Ohara, MD;  Location: [facility] CATH LAB;  Service: Cardiovascular;  Laterality: N/A;  . RIGHT HEART CATHETERIZATION N/A 12/14/2013   Procedure: RIGHT HEART CATH;  Surgeon: Larey Dresser, MD;  Location: [facility] CATH LAB;  Service: Cardiovascular;  Laterality: N/A;  . SIGMOIDECTOMY  2010   diverticular disease  . VENTRAL HERNIA REPAIR N/A 11/15/2015   Procedure: LAPAROSCOPIC  VENTRAL WALL HERNIA REPAIR;  Surgeon: [name], MD;  Location: [facility] ORS;  Service: General;  Laterality: N/A;     reports that  has never smoked. she has never used smokeless tobacco. She reports that she does not drink alcohol or use drugs.  Allergies  Allergen Reactions  . Almond Oil Anaphylaxis  . Morphine And Related Shortness Of Breath and Other (See Comments)    Pt. States while in the hospital it affected her breathing, O2 dropped to the 70's  . Ceclor [Cefaclor] Nausea And Vomiting  . Ciprofloxacin Other (See Comments)    Makes joints and muscles ache  .  Levaquin [Levofloxacin] Other (See Comments)    Body aches  . Sulfa Antibiotics Nausea And Vomiting    Family History  Problem Relation Age of Onset  . Heart disease Mother   . Hypertension Mother   . Dementia Mother   . Heart disease Father   . COPD Father   . Hypertension Father      Prior to Admission medications   Medication Sig Start Date End Date Taking? Authorizing Provider  canagliflozin (INVOKANA) 300 MG TABS tablet Take 300 mg by mouth daily before breakfast.   Yes [provider]  Cholecalciferol (VITAMIN D-3) 5000 UNITS TABS Take 5,000 Units by mouth daily.    Yes [provider]  DULoxetine (CYMBALTA) 60 MG capsule Take 60 mg by mouth 2 (two) times daily.    Yes [provider]  estradiol (ESTRACE) 2 MG tablet Take 1 tablet (2 mg total) by mouth daily. 07/29/11  Yes Rivard, Katharine Look, MD  fenofibrate (TRICOR) 145 MG tablet Take 145 mg by mouth at bedtime.  09/28/14  Yes [provider]  folic acid (FOLVITE) 1 MG tablet Take 1 mg by mouth 2 (two) times daily.   Yes [provider]  furosemide (LASIX) 40 MG tablet Take 40 mg by mouth daily.   Yes [provider]  gabapentin (NEURONTIN) 100 MG capsule Take 200-400 mg by mouth See admin instructions. Take 2 capsules every morning and take 4 capsules at bedtime   Yes [provider]  Liraglutide (VICTOZA) 18 MG/3ML SOPN Inject 1.2 mg into the skin daily with lunch.    Yes [provider]  pantoprazole (PROTONIX) 40 MG tablet Take 40 mg by mouth daily.  04/14/12  Yes [provider]  PROAIR HFA 108 (90 Base) MCG/ACT inhaler INHALE 2 PUFFS INTO THE LUNGS EVERY FOUR HOURS AS NEEDED FOR WHEEZING. 11/21/15  Yes Tanda Rockers, MD  rOPINIRole (REQUIP) 4 MG tablet Take 4-8 mg by mouth See admin instructions. Take 1 tablet at lunch then take 2 tablets every evening   Yes [provider]  spironolactone (ALDACTONE) 25 MG tablet Take 25 mg by mouth daily.  02/18/17  Yes [provider]  TOUJEO SOLOSTAR 300 UNIT/ML SOPN Inject 70 Units into the skin daily with lunch.  02/03/17  Yes [provider]  ADVAIR HFA 115-21 MCG/ACT inhaler INHALE 2 PUFFS INTO THE LUNGS 2 TIMES DAILY. Patient not taking: Reported on 05/03/2017 04/20/17   [name], MD  albuterol (PROVENTIL) (2.5 MG/3ML) 0.083% nebulizer solution Take 3 mLs (2.5 mg total) by nebulization every 4 (four) hours as needed for wheezing. Patient not taking: Reported on 05/03/2017 08/26/15   Tanda Rockers, MD  BAYER CONTOUR NEXT TEST test strip As directed 07/30/15   [provider]  GLOBAL EASE INJECT PEN NEEDLES 32G X 4 MM MISC  06/19/15   [provider]  MICROLET LANCETS MISC As directed 07/30/15   [provider]  ondansetron (ZOFRAN ODT) 4 MG disintegrating tablet Take 1 tablet (4 mg total) by mouth every 8 (eight) hours as needed for nausea or vomiting. Patient not taking: Reported on 05/03/2017 04/01/16   Ward, Delice Bison, DO  traMADol (ULTRAM) 50 MG tablet Take 1.5-2 tablets (75-100 mg total) by mouth every 6 (six) hours as needed for severe pain. Patient not taking: Reported on 05/03/2017 11/15/15   [name], MD  umeclidinium bromide (INCRUSE ELLIPTA) 62.5 MCG/INH AEPB Inhale 1 puff into the lungs daily. Patient not taking: Reported on 03/29/2017 02/17/17   [name], MD  UNABLE TO FIND CPAP at bedtime    [provider]    Physical Exam: Vitals:   05/03/17 2216 05/03/17 2230 05/03/17 2245 05/03/17 2300  BP:  (!) 149/64 (!) 130/58 133/65  Pulse:  99 (!) 106 (!) 104  Resp:  (!) 22 (!) 23 (!) 23  Temp:      TempSrc:      SpO2: 92% 99% 93% 92%      Constitutional: NAD, calm  Eyes: PERTLA, lids and conjunctivae normal ENMT: Mucous membranes are moist. Posterior pharynx clear of any exudate or lesions.   Neck: normal, supple, no masses, no thyromegaly Respiratory: Mild tachypnea, dyspnea with speech. Prolonged expiratory phase  with wheezing. No accessory muscle use.  Cardiovascular: S1 & S2 heard, regular rate and rhythm. No significant JVD. Abdomen: No distension, no tenderness, no masses palpated. Bowel sounds normal.  Musculoskeletal: no clubbing / cyanosis. No joint deformity upper and lower extremities.    Skin: no significant rashes, lesions, ulcers. Warm, dry, well-perfused. Neurologic: CN 2-12 grossly intact. Sensation intact. Strength 5/5 in all 4 limbs.  Psychiatric:  Alert and oriented x 3. Calm, cooperative.     Labs on Admission: I have personally reviewed following labs and imaging studies  CBC: Recent Labs  Lab 05/03/17 2149  WBC 20.1*  HGB 13.1  HCT 40.1  MCV 81.8  PLT 947*   Basic Metabolic Panel: Recent Labs  Lab 05/03/17 2149  NA 134*  K 3.7  CL 94*  CO2 27  GLUCOSE 174*  BUN 17  CREATININE 1.45*  CALCIUM 9.8   GFR: CrCl cannot be calculated (Unknown ideal weight.). Liver Function Tests: No results for input(s): AST, ALT, ALKPHOS, BILITOT, PROT, ALBUMIN in the last 168 hours. No results for input(s): LIPASE, AMYLASE in the last 168 hours. No results for input(s): AMMONIA in the last 168 hours. Coagulation Profile: No results for input(s): INR, PROTIME in the last 168 hours. Cardiac Enzymes: No results for input(s): CKTOTAL, CKMB, CKMBINDEX, TROPONINI in the last 168 hours. BNP (last 3 results) No results for input(s): PROBNP in the last 8760 hours. HbA1C: No results for input(s): HGBA1C in the last 72 hours. CBG: No results for input(s): GLUCAP in the last 168 hours. Lipid Profile: No results for input(s): CHOL, HDL, LDLCALC, TRIG, CHOLHDL, LDLDIRECT in the last 72 hours. Thyroid Function Tests: No results for input(s): TSH, T4TOTAL, FREET4, T3FREE, THYROIDAB in the last 72 hours. Anemia Panel: No results for input(s): VITAMINB12, FOLATE, FERRITIN, TIBC, IRON, RETICCTPCT in the last 72 hours. Urine analysis:    Component Value Date/Time   COLORURINE YELLOW  04/14/2015 1501   APPEARANCEUR CLEAR 04/14/2015 1501   LABSPEC 1.021 04/14/2015 1501   PHURINE 7.5 04/14/2015 1501   GLUCOSEU NEGATIVE 04/14/2015 1501   HGBUR NEGATIVE 04/14/2015 1501   BILIRUBINUR NEGATIVE 04/14/2015  [place] 04/14/2015 1501   PROTEINUR NEGATIVE 04/14/2015 1501   UROBILINOGEN 0.2 09/19/2008 1400   NITRITE NEGATIVE 04/14/2015 1501   LEUKOCYTESUR NEGATIVE 04/14/2015 1501   Sepsis Labs: @LABRCNTIP (procalcitonin:4,lacticidven:4) )No results found for this or any previous visit (from the past 240 hour(s)).   Radiological Exams on Admission: Dg Chest 2 View  Result Date: 05/03/2017 CLINICAL DATA:  Worsening shortness of breath. EXAM: CHEST  2 VIEW COMPARISON:  09/12/2016. FINDINGS: Lungs are hyperexpanded. Interstitial markings are diffusely coarsened with chronic features. Cardiopericardial silhouette is at upper limits of normal for size. The visualized bony structures of the thorax are intact. IMPRESSION: Stable.  Hyperexpansion without acute cardiopulmonary findings. Electronically Signed   By: Misty Stanley M.D.   On: 05/03/2017 13:43    EKG: Independently reviewed. Sinus rhythm, LAD, incomplete RBBB.   Assessment/Plan   1. COPD with acute exacerbation  - Presents with progressive SOB and wheezing  - SOB and wheezing began with flu-like illness a week ago, other symptoms have resolved after treatment with Tamiflu, but SOB and wheezing has worsened  - She is afebrile on admission, CXR clear  - Treated with steroids and nebs in ED, has improved, but still dyspneic at rest and hypoxic with mild exertion  - Continue systemic steroid and prn nebs    2. Chronic diastolic CHF  - Appears well-compensated  - Diuretics held initially in light of increased SCr  - Follow daily wts, SLIV   3. CKD stage II  - SCr is 1.45 on admission, up from 1.2 range previously - Appears hypovolemic  - Hold diuretics initially, renally-dose medications, repeat chem  panel in am    4. Insulin-dependent DM  - A1c was 7.6% one year ago  - Managed at home with Toujeo 70 units qD, Victoza, and Invokana  - Check CBG's, start Lantus 40 units qD with Novolog per sliding-scale     DVT prophylaxis: sq heparin  Code Status: Full  Family Communication: Discussed with patient Disposition Plan: Observe on med-surg Consults called: None Admission status: Observation    Vianne Bulls, MD [facility] Pager [phone]  If 7PM-7AM, please contact night-coverage [email or web address] Password [facility]  05/04/2017, 12:50 AM

## 2017-05-04 NOTE — ED Notes (Signed)
Stood by herself moved to hospital bed, given pillows, sitting up to eat lunch given tissues

## 2017-05-04 NOTE — Progress Notes (Signed)
PROGRESS NOTE   Kathleen Hatfield  BZJ:[id number]    DOB: Sep 23, 1955    DOA: 05/03/2017  PCP: Myer Peer, MD   I have briefly reviewed patients previous medical records in [facility].  Brief Narrative:  62 year old female, PMH of COPD not on home oxygen, OSA on nightly CPAP, chronic diastolic CHF, stage II chronic kidney disease, IDDM, HLD, HTN, exposed to housemate friend confirmed with flu, herself developed flulike illness approximately 1 week ago and completed a course of Tamiflu without testing despite which she noted progressive dyspnea, wheezing, nonproductive cough.  Admitted for COPD exacerbation and acute hypoxic respiratory failure.  Improving.   Assessment & Plan:   Principal Problem:   COPD with acute exacerbation ([place]) Active Problems:   OSA (obstructive sleep apnea)   Chronic diastolic heart failure ([facility])   Stage 2 chronic kidney disease   1. COPD exacerbation: Precipitated by recent flulike illness.  Lifelong non-smoker.  Completed course of Tamiflu as outpatient.  Chest x-ray without acute findings.  Continue bronchodilator nebulizations and IV Solu-Medrol.  Added flutter valve.  Improving.  Patient sees Dr. Halford Chessman, Pulmonology as outpatient. 2. Uncontrolled type II DM/IDDM: Now worsened by steroids.  A1c 04/02/16: 7.6.  Will repeat.  Increase Lantus to 70 units daily (equivalent to home dose of Toujeo) and continue NovoLog moderate SSI.  Monitor closely and adjust insulins as needed.  Hold oral medications. 3. Chronic diastolic CHF: Compensated.  Diuretics temporarily on hold due to acute on chronic kidney disease. 4. Acute on stage III chronic kidney disease: Baseline creatinine may be in the 1.2-1.3 range.  Presented with creatinine of 1.45 which increased to 1.65.  Temporarily hold diuretics.  Encourage oral fluids.  Follow BMP in a.m. 5. Essential hypertension: Controlled. 6. OSA on nightly CPAP: Continue. 7. Hyperlipidemia: Continue fenofibrate. 8. Leukocytosis:  Likely related to steroids.   DVT prophylaxis: Heparin Code Status: Full Family Communication: None at bedside Disposition: DC home when clinically improved.   Consultants:  None  Procedures:  CPAP at bedtime  Antimicrobials:  Azithromycin-not continued   Subjective: Seen this morning in ED.  Feels better.  Mild intermittent dry cough.  No chest pain.  Dyspnea significantly better.  Still wheezing some.  ROS: As above  Objective:  Vitals:   05/04/17 0900 05/04/17 1000 05/04/17 1100 05/04/17 1200  BP: (!) 153/73 138/67 (!) 141/75 126/70  Pulse: 88 85  78  Resp: 13 14 19 14   Temp:      TempSrc:      SpO2: 95% 94%  97%    Examination:  General exam: Middle-aged female, moderately built and morbidly obese, sitting up comfortably in bed without distress. Respiratory system: Harsh and slightly reduced breath sounds bilaterally with scattered few bilateral expiratory rhonchi.  No crackles. Respiratory effort normal.  Able to speak in full sentences. Cardiovascular system: S1 & S2 heard, RRR. No JVD, murmurs, rubs, gallops or clicks.  Trace bilateral ankle edema. Gastrointestinal system: Abdomen is nondistended, soft and nontender. No organomegaly or masses felt. Normal bowel sounds heard. Central nervous system: Alert and oriented. No focal neurological deficits. Extremities: Symmetric 5 x 5 power. Skin: No rashes, lesions or ulcers Psychiatry: Judgement and insight appear normal. Mood & affect appropriate.     Data Reviewed: I have personally reviewed following labs and imaging studies  CBC: Recent Labs  Lab 05/03/17 2149 05/04/17 0421  WBC 20.1* 21.4*  NEUTROABS  --  19.2*  HGB 13.1 12.0  HCT 40.1 37.3  MCV  81.8 82.3  PLT 443* 891*   Basic Metabolic Panel: Recent Labs  Lab 05/03/17 2149 05/04/17 0421  NA 134* 137  K 3.7 5.0  CL 94* 97*  CO2 27 25  GLUCOSE 174* 332*  BUN 17 17  CREATININE 1.45* 1.65*  CALCIUM 9.8 9.8   HbA1C: No results for  input(s): HGBA1C in the last 72 hours. CBG: Recent Labs  Lab 05/04/17 0220 05/04/17 0831 05/04/17 1152  GLUCAP 286* 378* 332*    No results found for this or any previous visit (from the past 240 hour(s)).       Radiology Studies: Dg Chest 2 View  Result Date: 05/03/2017 CLINICAL DATA:  Worsening shortness of breath. EXAM: CHEST  2 VIEW COMPARISON:  09/12/2016. FINDINGS: Lungs are hyperexpanded. Interstitial markings are diffusely coarsened with chronic features. Cardiopericardial silhouette is at upper limits of normal for size. The visualized bony structures of the thorax are intact. IMPRESSION: Stable.  Hyperexpansion without acute cardiopulmonary findings. Electronically Signed   By: Misty Stanley M.D.   On: 05/03/2017 13:43        Scheduled Meds: . cholecalciferol  5,000 Units Oral Daily  . DULoxetine  60 mg Oral BID  . fenofibrate  160 mg Oral Daily  . folic acid  1 mg Oral BID  . gabapentin  200 mg Oral Daily  . gabapentin  400 mg Oral QHS  . heparin  5,000 Units Subcutaneous Q8H  . insulin aspart  0-15 Units Subcutaneous TID WC  . insulin aspart  0-5 Units Subcutaneous QHS  . insulin glargine  40 Units Subcutaneous Daily  . methylPREDNISolone (SOLU-MEDROL) injection  40 mg Intravenous Q8H  . pantoprazole  40 mg Oral Daily  . rOPINIRole  4 mg Oral Q1200  . rOPINIRole  8 mg Oral QHS  . sodium chloride flush  3 mL Intravenous Q12H   Continuous Infusions: . sodium chloride    . azithromycin Stopped (05/04/17 0356)     LOS: 0 days     Vernell Leep, MD, FACP, [facility]. [facility] Pager [phone]  If 7PM-7AM, please contact night-coverage [email or web address] Password [email or web address] 05/04/2017, 1:40 PM

## 2017-05-04 NOTE — ED Notes (Signed)
Pt sitting on side of bed for lunch

## 2017-05-04 NOTE — Progress Notes (Signed)
Received patient from ED, AOx4, ambulatory, VS stable, O2Sat at 93% on RA, and denies any pain.  Patient oriented to room, bed controls and call light.  Patient had just washed up and changed gown, and resting on bed. Put on contact precaution due to history of MRSA pending result of PCR.  Will monitor.

## 2017-05-04 NOTE — ED Notes (Signed)
Pt. Ambulated to bathroom with steady gait.

## 2017-05-04 NOTE — ED Notes (Signed)
Checked patient cbg it was

## 2017-05-04 NOTE — ED Notes (Signed)
Admit dr  Into see pt

## 2017-05-04 NOTE — Progress Notes (Signed)
CPAP set up at 13.0 cm H20 per patients home settings, FFM. Patient will call when ready to be placed on.

## 2017-05-04 NOTE — ED Notes (Signed)
Flutter valve requested resp per admit dr order

## 2017-05-04 NOTE — ED Notes (Signed)
Lunch (Carb Modified diet) tray re-ordered. Kitchen staff claims that a tray was never ordered in the first place.

## 2017-05-04 NOTE — ED Notes (Signed)
resp into teach pt about flutter valve

## 2017-05-05 DIAGNOSIS — N189 Chronic kidney disease, unspecified: Secondary | ICD-10-CM

## 2017-05-05 DIAGNOSIS — N179 Acute kidney failure, unspecified: Secondary | ICD-10-CM | POA: Diagnosis not present

## 2017-05-05 DIAGNOSIS — Z6841 Body Mass Index (BMI) 40.0 and over, adult: Secondary | ICD-10-CM | POA: Diagnosis not present

## 2017-05-05 DIAGNOSIS — Z882 Allergy status to sulfonamides status: Secondary | ICD-10-CM | POA: Diagnosis not present

## 2017-05-05 DIAGNOSIS — G4733 Obstructive sleep apnea (adult) (pediatric): Secondary | ICD-10-CM | POA: Diagnosis not present

## 2017-05-05 DIAGNOSIS — Z90710 Acquired absence of both cervix and uterus: Secondary | ICD-10-CM | POA: Diagnosis not present

## 2017-05-05 DIAGNOSIS — E1122 Type 2 diabetes mellitus with diabetic chronic kidney disease: Secondary | ICD-10-CM | POA: Diagnosis not present

## 2017-05-05 DIAGNOSIS — N183 Chronic kidney disease, stage 3 (moderate): Secondary | ICD-10-CM | POA: Diagnosis not present

## 2017-05-05 DIAGNOSIS — I5032 Chronic diastolic (congestive) heart failure: Secondary | ICD-10-CM | POA: Diagnosis not present

## 2017-05-05 DIAGNOSIS — J9601 Acute respiratory failure with hypoxia: Secondary | ICD-10-CM | POA: Diagnosis not present

## 2017-05-05 DIAGNOSIS — E119 Type 2 diabetes mellitus without complications: Secondary | ICD-10-CM | POA: Diagnosis not present

## 2017-05-05 DIAGNOSIS — E875 Hyperkalemia: Secondary | ICD-10-CM | POA: Diagnosis not present

## 2017-05-05 DIAGNOSIS — Z9049 Acquired absence of other specified parts of digestive tract: Secondary | ICD-10-CM | POA: Diagnosis not present

## 2017-05-05 DIAGNOSIS — N289 Disorder of kidney and ureter, unspecified: Secondary | ICD-10-CM | POA: Diagnosis present

## 2017-05-05 DIAGNOSIS — T380X5A Adverse effect of glucocorticoids and synthetic analogues, initial encounter: Secondary | ICD-10-CM | POA: Diagnosis not present

## 2017-05-05 DIAGNOSIS — Z881 Allergy status to other antibiotic agents status: Secondary | ICD-10-CM | POA: Diagnosis not present

## 2017-05-05 DIAGNOSIS — Z825 Family history of asthma and other chronic lower respiratory diseases: Secondary | ICD-10-CM | POA: Diagnosis not present

## 2017-05-05 DIAGNOSIS — J441 Chronic obstructive pulmonary disease with (acute) exacerbation: Secondary | ICD-10-CM | POA: Diagnosis not present

## 2017-05-05 DIAGNOSIS — D72829 Elevated white blood cell count, unspecified: Secondary | ICD-10-CM | POA: Diagnosis present

## 2017-05-05 DIAGNOSIS — Z885 Allergy status to narcotic agent status: Secondary | ICD-10-CM | POA: Diagnosis not present

## 2017-05-05 DIAGNOSIS — I130 Hypertensive heart and chronic kidney disease with heart failure and stage 1 through stage 4 chronic kidney disease, or unspecified chronic kidney disease: Secondary | ICD-10-CM | POA: Diagnosis not present

## 2017-05-05 DIAGNOSIS — E861 Hypovolemia: Secondary | ICD-10-CM | POA: Diagnosis present

## 2017-05-05 DIAGNOSIS — Z8249 Family history of ischemic heart disease and other diseases of the circulatory system: Secondary | ICD-10-CM | POA: Diagnosis not present

## 2017-05-05 DIAGNOSIS — K219 Gastro-esophageal reflux disease without esophagitis: Secondary | ICD-10-CM | POA: Diagnosis present

## 2017-05-05 DIAGNOSIS — E785 Hyperlipidemia, unspecified: Secondary | ICD-10-CM | POA: Diagnosis not present

## 2017-05-05 DIAGNOSIS — Z888 Allergy status to other drugs, medicaments and biological substances status: Secondary | ICD-10-CM | POA: Diagnosis not present

## 2017-05-05 LAB — BASIC METABOLIC PANEL
Anion gap: 14 (ref 5–15)
Anion gap: 13 (ref 5–15)
BUN: 32 mg/dL — ABNORMAL HIGH (ref 6–20)
BUN: 35 mg/dL — ABNORMAL HIGH (ref 6–20)
CO2: 25 mmol/L (ref 22–32)
CO2: 24 mmol/L (ref 22–32)
Calcium: 9.5 mg/dL (ref 8.9–10.3)
Calcium: 9.8 mg/dL (ref 8.9–10.3)
Chloride: 94 mmol/L — ABNORMAL LOW (ref 101–111)
Chloride: 97 mmol/L — ABNORMAL LOW (ref 101–111)
Creatinine, Ser: 1.53 mg/dL — ABNORMAL HIGH (ref 0.44–1.00)
Creatinine, Ser: 1.43 mg/dL — ABNORMAL HIGH (ref 0.44–1.00)
GFR calc Af Amer: 41 mL/min — ABNORMAL LOW (ref >60)
GFR calc Af Amer: 44 mL/min — ABNORMAL LOW (ref >60)
GFR calc non Af Amer: 35 mL/min — ABNORMAL LOW (ref >60)
GFR calc non Af Amer: 38 mL/min — ABNORMAL LOW (ref >60)
Glucose, Bld: 359 mg/dL — ABNORMAL HIGH (ref 65–99)
Glucose, Bld: 249 mg/dL — ABNORMAL HIGH (ref 65–99)
Potassium: 5.3 mmol/L — ABNORMAL HIGH (ref 3.5–5.1)
Potassium: 4.5 mmol/L (ref 3.5–5.1)
Sodium: 133 mmol/L — ABNORMAL LOW (ref 135–145)
Sodium: 134 mmol/L — ABNORMAL LOW (ref 135–145)

## 2017-05-05 LAB — EXPECTORATED SPUTUM ASSESSMENT W GRAM STAIN, RFLX TO RESP C

## 2017-05-05 LAB — GLUCOSE, CAPILLARY
Glucose-Capillary: 333 mg/dL — ABNORMAL HIGH (ref 65–99)
Glucose-Capillary: 291 mg/dL — ABNORMAL HIGH (ref 65–99)
Glucose-Capillary: 211 mg/dL — ABNORMAL HIGH (ref 65–99)
Glucose-Capillary: 221 mg/dL — ABNORMAL HIGH (ref 65–99)

## 2017-05-05 LAB — MRSA PCR SCREENING: MRSA by PCR: NEGATIVE

## 2017-05-05 MED ORDER — GUAIFENESIN 100 MG/5ML PO SOLN
5.0000 mL | ORAL | Status: DC | PRN
  Administered 2017-05-05 – 2017-05-06 (×4): 100 mg via ORAL
  Filled 2017-05-05 (×4): qty 5

## 2017-05-05 MED ORDER — IPRATROPIUM-ALBUTEROL 0.5-2.5 (3) MG/3ML IN SOLN
3.0000 mL | Freq: Three times a day (TID) | RESPIRATORY_TRACT | Status: DC
  Administered 2017-05-05 – 2017-05-06 (×3): 3 mL via RESPIRATORY_TRACT
  Filled 2017-05-05 (×3): qty 3

## 2017-05-05 MED ORDER — INSULIN GLARGINE 100 UNIT/ML ~~LOC~~ SOLN
5.0000 [IU] | Freq: Once | SUBCUTANEOUS | Status: AC
  Administered 2017-05-05: 5 [IU] via SUBCUTANEOUS
  Filled 2017-05-05 (×2): qty 0.05

## 2017-05-05 MED ORDER — INSULIN ASPART 100 UNIT/ML ~~LOC~~ SOLN
4.0000 [IU] | Freq: Three times a day (TID) | SUBCUTANEOUS | Status: DC
  Administered 2017-05-05 – 2017-05-06 (×3): 4 [IU] via SUBCUTANEOUS

## 2017-05-05 MED ORDER — METHYLPREDNISOLONE SODIUM SUCC 40 MG IJ SOLR
40.0000 mg | Freq: Two times a day (BID) | INTRAMUSCULAR | Status: DC
  Administered 2017-05-05 – 2017-05-06 (×2): 40 mg via INTRAVENOUS
  Filled 2017-05-05 (×2): qty 1

## 2017-05-05 NOTE — Progress Notes (Signed)
PROGRESS NOTE   Kathleen Hatfield  MKL:[id number]    DOB: April 16, 1955    DOA: 05/03/2017  PCP: Myer Peer, MD   I have briefly reviewed patients previous medical records in [facility].  Brief Narrative:  62 year old female, PMH of COPD not on home oxygen, OSA on nightly CPAP, chronic diastolic CHF, stage II chronic kidney disease, IDDM, HLD, HTN, exposed to housemate friend confirmed with flu, herself developed flulike illness approximately 1 week ago and completed a course of Tamiflu without testing despite which she noted progressive dyspnea, wheezing, nonproductive cough.  Admitted for COPD exacerbation and acute hypoxic respiratory failure.  Slowly improving   Assessment & Plan:   Principal Problem:   COPD with acute exacerbation ([place]) Active Problems:   OSA (obstructive sleep apnea)   Chronic diastolic heart failure (HCC)   Stage 2 chronic kidney disease   1. COPD exacerbation: Precipitated by recent flulike illness.  Lifelong non-smoker.  Completed course of Tamiflu as outpatient.  Chest x-ray without acute findings.  Continue bronchodilator nebulizations and IV Solu-Medrol.  Added flutter valve. Patient sees Dr. Halford Chessman, Pulmonology as outpatient.  Continues to slowly improve.  Reduce IV Solu-Medrol to 40 mg every 12 hours.. 2. Uncontrolled type II DM/IDDM: Now worsened by steroids.  A1c 04/02/16: 7.6.  Will repeat.  Increased Lantus to 70 units daily (equivalent to home dose of Toujeo) and continue NovoLog moderate SSI.  Hold oral medications.  CBGs mildly uncontrolled as noted below.  Add mealtime NovoLog, decreasing steroids should help.  Repeat A1c: 8.5 suggesting poor outpatient control. 3. Chronic diastolic CHF: Compensated.  Diuretics temporarily on hold due to acute on chronic kidney disease. 4. Acute on stage III chronic kidney disease: Baseline creatinine may be in the 1.2-1.3 range.  Presented with creatinine of 1.45 which increased to 1.65.  Temporarily hold diuretics.   Encourage oral fluids.  Creatinine is improved to 1.5.  Follow BMP in a.m. 5. Mild hyperkalemia:?  Hemolysis but could also be real because gradually increasing.  Follow BMP this evening.  Change diet to renal and carb modified. 6. Essential hypertension: Controlled. 7. OSA on nightly CPAP: Continue. 8. Hyperlipidemia: Continue fenofibrate. 9. Leukocytosis: Likely related to steroids.   DVT prophylaxis: Heparin Code Status: Full Family Communication: None at bedside Disposition: DC home when clinically improved, possibly in the next 2-3 days.   Consultants:  None  Procedures:  CPAP at bedtime  Antimicrobials:  Azithromycin   Subjective: Chest congestion but unable to bring up sputum.  Asking for cough medicine.  Dyspnea improved but still not at baseline.  No chest pain reported.  ROS: As above  Objective:  Vitals:   05/05/17 0459 05/05/17 0502 05/05/17 0831 05/05/17 1429  BP: (!) 129/56   117/63  Pulse: 81   78  Resp: 16   18  Temp: 98.1 F (36.7 C)   97.9 F (36.6 C)  TempSrc: Oral   Oral  SpO2: 91%  97% 97%  Weight:  122.7 kg (270 lb 8.1 oz)    Height:  5' 5"  (1.651 m)      Examination:  General exam: Middle-aged female, moderately built and morbidly obese, sitting up comfortably in bed without distress. Respiratory system: Improved breath sounds compared to yesterday.  Still slightly harsh with scattered few bilateral expiratory rhonchi.  No crackles.  No increased work of breathing. Cardiovascular system: S1 & S2 heard, RRR. No JVD, murmurs, rubs, gallops or clicks.  Trace bilateral ankle edema.  Stable without change.  Gastrointestinal system: Abdomen is nondistended, soft and nontender. No organomegaly or masses felt. Normal bowel sounds heard.  Stable without change. Central nervous system: Alert and oriented. No focal neurological deficits. Extremities: Symmetric 5 x 5 power. Skin: No rashes, lesions or ulcers Psychiatry: Judgement and insight appear  normal. Mood & affect appropriate.     Data Reviewed: I have personally reviewed following labs and imaging studies  CBC: Recent Labs  Lab 05/03/17 2149 05/04/17 0421  WBC 20.1* 21.4*  NEUTROABS  --  19.2*  HGB 13.1 12.0  HCT 40.1 37.3  MCV 81.8 82.3  PLT 443* 335*   Basic Metabolic Panel: Recent Labs  Lab 05/03/17 2149 05/04/17 0421 05/05/17 0637  NA 134* 137 133*  K 3.7 5.0 5.3*  CL 94* 97* 94*  CO2 27 25 25   GLUCOSE 174* 332* 359*  BUN 17 17 32*  CREATININE 1.45* 1.65* 1.53*  CALCIUM 9.8 9.8 9.5   HbA1C: Recent Labs    05/04/17 0421  HGBA1C 8.5*   CBG: Recent Labs  Lab 05/04/17 1152 05/04/17 1659 05/04/17 2145 05/05/17 0758 05/05/17 1201  GLUCAP 332* 281* 323* 333* 291*    Recent Results (from the past 240 hour(s))  MRSA PCR Screening     Status: None   Collection Time: 05/04/17 10:47 PM  Result Value Ref Range Status   MRSA by PCR NEGATIVE NEGATIVE Final    Comment:        The GeneXpert MRSA Assay (FDA approved for NASAL specimens only), is one component of a comprehensive MRSA colonization surveillance program. It is not intended to diagnose MRSA infection nor to guide or monitor treatment for MRSA infections. Performed at [facility], [place] [address] [place], [place] [address]   Culture, sputum-assessment     Status: None   Collection Time: 05/05/17  6:06 AM  Result Value Ref Range Status   Specimen Description EXPECTORATED SPUTUM  Final   Special Requests NONE  Final   Sputum evaluation   Final    THIS SPECIMEN IS ACCEPTABLE FOR SPUTUM CULTURE Performed at [facility], [address], [place], [place] [address]    Report Status 05/05/2017 FINAL  Final  Culture, respiratory (NON-Expectorated)     Status: None (Preliminary result)   Collection Time: 05/05/17  6:06 AM  Result Value Ref Range Status   Specimen Description EXPECTORATED SPUTUM  Final   Special Requests NONE Reflexed from [id number]  Final   Gram Stain    Final    ABUNDANT WBC PRESENT, PREDOMINANTLY PMN RARE GRAM POSITIVE COCCI Performed at [facility], [place] [address], [place], [place] [address]    Culture PENDING  Incomplete   Report Status PENDING  Incomplete         Radiology Studies: No results found.      Scheduled Meds: . azithromycin  250 mg Oral Daily  . cholecalciferol  5,000 Units Oral Daily  . DULoxetine  60 mg Oral BID  . fenofibrate  160 mg Oral Daily  . folic acid  1 mg Oral BID  . gabapentin  200 mg Oral Daily  . gabapentin  400 mg Oral QHS  . heparin  5,000 Units Subcutaneous Q8H  . insulin aspart  0-15 Units Subcutaneous TID WC  . insulin aspart  0-5 Units Subcutaneous QHS  . insulin glargine  70 Units Subcutaneous Daily  . ipratropium-albuterol  3 mL Nebulization TID  . methylPREDNISolone (SOLU-MEDROL) injection  40 mg Intravenous Q8H  .  pantoprazole  40 mg Oral Daily  . rOPINIRole  4 mg Oral BID  . sodium chloride flush  3 mL Intravenous Q12H   Continuous Infusions: . sodium chloride       LOS: 0 days     Vernell Leep, MD, FACP, [facility]. [facility] Pager [phone]  If 7PM-7AM, please contact night-coverage [email or web address] Password [facility] 05/05/2017, 2:41 PM

## 2017-05-06 ENCOUNTER — Encounter (HOSPITAL_COMMUNITY): Payer: Medicare HMO

## 2017-05-06 ENCOUNTER — Encounter (HOSPITAL_COMMUNITY): Payer: Self-pay | Admitting: Surgery

## 2017-05-06 DIAGNOSIS — E119 Type 2 diabetes mellitus without complications: Secondary | ICD-10-CM

## 2017-05-06 LAB — BASIC METABOLIC PANEL
Anion gap: 15 (ref 5–15)
BUN: 35 mg/dL — ABNORMAL HIGH (ref 6–20)
CO2: 22 mmol/L (ref 22–32)
Calcium: 9.7 mg/dL (ref 8.9–10.3)
Chloride: 99 mmol/L — ABNORMAL LOW (ref 101–111)
Creatinine, Ser: 1.3 mg/dL — ABNORMAL HIGH (ref 0.44–1.00)
GFR calc Af Amer: 50 mL/min — ABNORMAL LOW (ref >60)
GFR calc non Af Amer: 43 mL/min — ABNORMAL LOW (ref >60)
Glucose, Bld: 241 mg/dL — ABNORMAL HIGH (ref 65–99)
Potassium: 4.9 mmol/L (ref 3.5–5.1)
Sodium: 136 mmol/L (ref 135–145)

## 2017-05-06 LAB — GLUCOSE, CAPILLARY
Glucose-Capillary: 266 mg/dL — ABNORMAL HIGH (ref 65–99)
Glucose-Capillary: 204 mg/dL — ABNORMAL HIGH (ref 65–99)

## 2017-05-06 MED ORDER — PREDNISONE 20 MG PO TABS
40.0000 mg | ORAL_TABLET | Freq: Every day | ORAL | Status: DC
  Administered 2017-05-06: 40 mg via ORAL
  Filled 2017-05-06: qty 2

## 2017-05-06 MED ORDER — GUAIFENESIN-DM 100-10 MG/5ML PO SYRP: 5.0000 mL | ORAL_SOLUTION | Freq: Four times a day (QID) | ORAL | 0 refills | Status: DC | PRN

## 2017-05-06 MED ORDER — PREDNISONE 10 MG PO TABS: ORAL_TABLET | ORAL | 0 refills | Status: DC

## 2017-05-06 MED ORDER — AZITHROMYCIN 250 MG PO TABS: 250.0000 mg | ORAL_TABLET | Freq: Every day | ORAL | 0 refills | Status: DC

## 2017-05-06 MED ORDER — IPRATROPIUM-ALBUTEROL 0.5-2.5 (3) MG/3ML IN SOLN
3.0000 mL | Freq: Four times a day (QID) | RESPIRATORY_TRACT | Status: DC
  Administered 2017-05-06: 3 mL via RESPIRATORY_TRACT
  Filled 2017-05-06: qty 3

## 2017-05-06 NOTE — Progress Notes (Signed)
1622 Patient discharged to home. Verbalizes understanding of all discharge instructions including discharge medications and follow up MD visits. Patient is accompanied by significant other.

## 2017-05-06 NOTE — Progress Notes (Signed)
SATURATION QUALIFICATIONS: (This note is used to comply with regulatory documentation for home oxygen)  Patient Saturations on Room Air at Rest = 92%  Patient Saturations on Room Air while Ambulating = 92%  Patient Saturations on 2 Liters of oxygen while Ambulating = 95-96%  Please briefly explain why patient needs home oxygen:

## 2017-05-06 NOTE — Discharge Summary (Signed)
Physician Discharge Summary  Kathleen Hatfield ZSW:[id number] DOB: 1955/06/23  PCP: Myer Peer, MD  Admit date: 05/03/2017 Discharge date: 05/06/2017  Recommendations for Outpatient Follow-up:  1. Dr. Ann Held, PCP in 4 days with repeat labs (CBC & BMP). 2. Dr. [name], Pulmonology 1 week.  Home Health: None Equipment/Devices: None  Discharge Condition: Improved and stable CODE STATUS: Full Diet recommendation: Heart healthy & diabetic diet.  Discharge Diagnoses:  Principal Problem:   COPD with acute exacerbation ([place]) Active Problems:   OSA (obstructive sleep apnea)   Chronic diastolic heart failure (HCC)   Stage 2 chronic kidney disease   Brief Summary: 62 year old female, PMH of COPD not on home oxygen, OSA on nightly CPAP, chronic diastolic CHF, stage II chronic kidney disease, IDDM, HLD, HTN, exposed to housemate friend confirmed with flu, herself developed flulike illness approximately 1 week ago and completed a course of Tamiflu without testing despite which she noted progressive dyspnea, wheezing, nonproductive cough.  Admitted for COPD exacerbation and acute hypoxic respiratory failure.   Assessment & Plan:   1. COPD exacerbation: Precipitated by recent flulike illness.  Lifelong non-smoker.  Completed course of Tamiflu as outpatient.  Chest x-ray without acute findings.    Treated with bronchodilator nebulizations, azithromycin and IV Solu-Medrol.  Added flutter valve. Patient sees Dr. Halford Chessman, Pulmonology as outpatient.    Clinically improved.  Transitioned to oral prednisone taper at discharge. 2. Acute hypoxic respiratory failure: Secondary to COPD exacerbation.  Resolved. 3. Uncontrolled type II DM/IDDM: Now worsened by steroids.  A1c: 8.5 suggesting poor outpatient control.  Now worsened by steroids but should improve with tapering off of prednisone.  Continue prior home dose of Lantus and other antidiabetics as below.  Close outpatient follow-up. 4. Chronic  diastolic CHF: Compensated.  Diuretics temporarily on hold due to acute on chronic kidney disease.  Resumed diuretics at discharge. 5. Acute on stage III chronic kidney disease: Baseline creatinine may be in the 1.2-1.3 range.  Presented with creatinine of 1.45 which increased to 1.65.  Temporarily held diuretics.  Encourage oral fluids.  Creatinine is improved to 1.3.  Resumed diuretics at discharge.  Recommend close outpatient follow-up of BMP. 6. Mild hyperkalemia: Resolved.  However recommend monitoring BMP closely while patient is on Aldactone in the context of chronic kidney disease. 7. Essential hypertension: Controlled. 8. OSA on nightly CPAP: Continue. 9. Hyperlipidemia: Continue fenofibrate. 10. Leukocytosis: Likely related to steroids. 11. Morbid obesity/Body mass index is 45.01 kg/m.  Needs to lose weight.   Consultants:  None  Procedures:  CPAP at bedtime    Discharge Instructions  Discharge Instructions    (HEART FAILURE PATIENTS) Call MD:  Anytime you have any of the following symptoms: 1) 3 pound weight gain in 24 hours or 5 pounds in 1 week 2) shortness of breath, with or without a dry hacking cough 3) swelling in the hands, feet or stomach 4) if you have to sleep on extra pillows at night in order to breathe.   Complete by:  As directed    Call MD for:  difficulty breathing, headache or visual disturbances   Complete by:  As directed    Call MD for:  extreme fatigue   Complete by:  As directed    Call MD for:  persistant dizziness or light-headedness   Complete by:  As directed    Call MD for:  temperature >100.4   Complete by:  As directed    Diet - low sodium heart healthy  Complete by:  As directed    Diet Carb Modified   Complete by:  As directed    Increase activity slowly   Complete by:  As directed        Medication List    STOP taking these medications   ADVAIR HFA 115-21 MCG/ACT inhaler Generic drug:  fluticasone-salmeterol   ondansetron 4  MG disintegrating tablet Commonly known as:  ZOFRAN ODT   traMADol 50 MG tablet Commonly known as:  ULTRAM   umeclidinium bromide 62.5 MCG/INH Aepb Commonly known as:  INCRUSE ELLIPTA     TAKE these medications   azithromycin 250 MG tablet Commonly known as:  ZITHROMAX Take 1 tablet (250 mg total) by mouth daily. Start taking on:  05/07/2017   BAYER CONTOUR NEXT TEST test strip Generic drug:  glucose blood As directed   DULoxetine 60 MG capsule Commonly known as:  CYMBALTA Take 60 mg by mouth 2 (two) times daily.   estradiol 2 MG tablet Commonly known as:  ESTRACE Take 1 tablet (2 mg total) by mouth daily.   fenofibrate 145 MG tablet Commonly known as:  TRICOR Take 145 mg by mouth at bedtime.   folic acid 1 MG tablet Commonly known as:  FOLVITE Take 1 mg by mouth 2 (two) times daily.   furosemide 40 MG tablet Commonly known as:  LASIX Take 40 mg by mouth daily.   gabapentin 100 MG capsule Commonly known as:  NEURONTIN Take 200-400 mg by mouth See admin instructions. Take 2 capsules every morning and take 4 capsules at bedtime   GLOBAL EASE INJECT PEN NEEDLES 32G X 4 MM Misc Generic drug:  Insulin Pen Needle   guaiFENesin-dextromethorphan 100-10 MG/5ML syrup Commonly known as:  ROBITUSSIN DM Take 5 mLs by mouth every 6 (six) hours as needed for cough.   INVOKANA 300 MG Tabs tablet Generic drug:  canagliflozin Take 300 mg by mouth daily before breakfast.   MICROLET LANCETS Misc As directed   pantoprazole 40 MG tablet Commonly known as:  PROTONIX Take 40 mg by mouth daily.   predniSONE 10 MG tablet Commonly known as:  DELTASONE Take 4 tabs daily for 2 days, then 3 tabs daily for 2 days, then 2 tabs daily for 2 days, then 1 tab daily for 2 days, then stop. Start taking on:  05/07/2017   PROAIR HFA 108 (90 Base) MCG/ACT inhaler Generic drug:  albuterol INHALE 2 PUFFS INTO THE LUNGS EVERY FOUR HOURS AS NEEDED FOR WHEEZING. What changed:  Another medication  with the same name was removed. Continue taking this medication, and follow the directions you see here.   rOPINIRole 4 MG tablet Commonly known as:  REQUIP Take 4 mg by mouth 2 (two) times daily.   spironolactone 25 MG tablet Commonly known as:  ALDACTONE Take 25 mg by mouth daily.   TOUJEO SOLOSTAR 300 UNIT/ML Sopn Generic drug:  Insulin Glargine Inject 70 Units into the skin daily with lunch.   UNABLE TO FIND CPAP at bedtime   VICTOZA 18 MG/3ML Sopn Generic drug:  liraglutide Inject 1.2 mg into the skin daily with lunch.   Vitamin D-3 5000 units Tabs Take 5,000 Units by mouth daily.      Follow-up Information    Myer Peer, MD. Schedule an appointment as soon as possible for a visit in 4 day(s).   Specialty:  Family Medicine Why:  To be seen with repeat labs (CBC & BMP). Contact information: [place]  [id number] [phone]        Sood, Vineet, MD. Schedule an appointment as soon as possible for a visit in 1 week(s).   Specialty:  Pulmonary Disease Contact information: [address] [place] [address] [phone]          Allergies  Allergen Reactions  . Almond Oil Anaphylaxis  . Morphine And Related Shortness Of Breath and Other (See Comments)    Pt. States while in the hospital it affected her breathing, O2 dropped to the 70's  . Ceclor [Cefaclor] Nausea And Vomiting  . Ciprofloxacin Other (See Comments)    Makes joints and muscles ache  . Levaquin [Levofloxacin] Other (See Comments)    Body aches  . Sulfa Antibiotics Nausea And Vomiting      Procedures/Studies: Dg Chest 2 View  Result Date: 05/03/2017 CLINICAL DATA:  Worsening shortness of breath. EXAM: CHEST  2 VIEW COMPARISON:  09/12/2016. FINDINGS: Lungs are hyperexpanded. Interstitial markings are diffusely coarsened with chronic features. Cardiopericardial silhouette is at upper limits of normal for size. The visualized bony structures of the thorax are  intact. IMPRESSION: Stable.  Hyperexpansion without acute cardiopulmonary findings. Electronically Signed   By: Misty Stanley M.D.   On: 05/03/2017 13:43      Subjective: Overall feels much better.  Reports that her airway feels dry especially at night while on CPAP.  Mild intermittent nonproductive cough.  No chest pain.  Dyspnea significantly improved and breathing approaching baseline.  Ambulated with nursing and not hypoxic on room air.  Discharge Exam:  Vitals:   05/06/17 0536 05/06/17 0914 05/06/17 1300 05/06/17 1458  BP: (!) 124/58  127/62   Pulse: 67  70   Resp: 18  16   Temp: 97.6 F (36.4 C)  98.2 F (36.8 C)   TempSrc: Oral  Oral   SpO2: 98% 96% 95% 93%  Weight:      Height:        General exam: Middle-aged female, moderately built and morbidly obese, sitting up comfortably in bed without distress. Respiratory system:  Much improved breath sounds compared to 2 days ago.  Clear to auscultation anteriorly.  Occasional rhonchi posteriorly.  No crackles.  No increased work of breathing.  Able to speak in full sentences. Cardiovascular system: S1 & S2 heard, RRR. No JVD, murmurs, rubs, gallops or clicks.  Trace bilateral ankle edema.   Gastrointestinal system: Abdomen is nondistended, soft and nontender. No organomegaly or masses felt. Normal bowel sounds heard. Central nervous system: Alert and oriented. No focal neurological deficits. Extremities: Symmetric 5 x 5 power. Skin: No rashes, lesions or ulcers Psychiatry: Judgement and insight appear normal. Mood & affect appropriate.       The results of significant diagnostics from this hospitalization (including imaging, microbiology, ancillary and laboratory) are listed below for reference.     Microbiology: Recent Results (from the past 240 hour(s))  MRSA PCR Screening     Status: None   Collection Time: 05/04/17 10:47 PM  Result Value Ref Range Status   MRSA by PCR NEGATIVE NEGATIVE Final    Comment:        The  GeneXpert MRSA Assay (FDA approved for NASAL specimens only), is one component of a comprehensive MRSA colonization surveillance program. It is not intended to diagnose MRSA infection nor to guide or monitor treatment for MRSA infections. Performed at [facility], [place] [address], [place], [place] [address]   Culture, sputum-assessment     Status: None  Collection Time: 05/05/17  6:06 AM  Result Value Ref Range Status   Specimen Description EXPECTORATED SPUTUM  Final   Special Requests NONE  Final   Sputum evaluation   Final    THIS SPECIMEN IS ACCEPTABLE FOR SPUTUM CULTURE Performed at [facility], [address], [place], [place] [address]    Report Status 05/05/2017 FINAL  Final  Culture, respiratory (NON-Expectorated)     Status: None (Preliminary result)   Collection Time: 05/05/17  6:06 AM  Result Value Ref Range Status   Specimen Description EXPECTORATED SPUTUM  Final   Special Requests NONE Reflexed from [id number]  Final   Gram Stain   Final    ABUNDANT WBC PRESENT, PREDOMINANTLY PMN RARE GRAM POSITIVE COCCI    Culture   Final    CULTURE REINCUBATED FOR BETTER GROWTH Performed at [facility], [place] [address], [place], [place] [address]    Report Status PENDING  Incomplete     Labs: CBC: Recent Labs  Lab 05/03/17 2149 05/04/17 0421  WBC 20.1* 21.4*  NEUTROABS  --  19.2*  HGB 13.1 12.0  HCT 40.1 37.3  MCV 81.8 82.3  PLT 443* 623*   Basic Metabolic Panel: Recent Labs  Lab 05/03/17 2149 05/04/17 0421 05/05/17 0637 05/05/17 1628 05/06/17 0506  NA 134* 137 133* 134* 136  K 3.7 5.0 5.3* 4.5 4.9  CL 94* 97* 94* 97* 99*  CO2 27 25 25 24 22   GLUCOSE 174* 332* 359* 249* 241*  BUN 17 17 32* 35* 35*  CREATININE 1.45* 1.65* 1.53* 1.43* 1.30*  CALCIUM 9.8 9.8 9.5 9.8 9.7   CBG: Recent Labs  Lab 05/05/17 1201 05/05/17 1728 05/05/17 2111 05/06/17 0833 05/06/17 1238  GLUCAP 291* 211* 221* 266* 204*   Hgb A1c Recent Labs     05/04/17 0421  HGBA1C 8.5*       Time coordinating discharge: Over 30 minutes  SIGNED:  Vernell Leep, MD, FACP, [facility]. [facility] Pager [phone]  If 7PM-7AM, please contact night-coverage [email or web address] Password [facility] 05/06/2017, 3:34 PM

## 2017-05-06 NOTE — Discharge Instructions (Signed)
Please get your medications reviewed and adjusted by your Primary MD.  Please request your Primary MD to go over all Hospital Tests and Procedure/Radiological results at the follow up, please get all Hospital records sent to your Prim MD by signing hospital release before you go home.  If you had Pneumonia of Lung problems at the Hospital: Please get a 2 view Chest X ray done in 6-8 weeks after hospital discharge or sooner if instructed by your Primary MD.  If you have Congestive Heart Failure: Please call your Cardiologist or Primary MD anytime you have any of the following symptoms:  1) 3 pound weight gain in 24 hours or 5 pounds in 1 week  2) shortness of breath, with or without a dry hacking cough  3) swelling in the hands, feet or stomach  4) if you have to sleep on extra pillows at night in order to breathe  Follow cardiac low salt diet and 1.5 lit/day fluid restriction.  If you have diabetes Accuchecks 4 times/day, Once in AM empty stomach and then before each meal. Log in all results and show them to your primary doctor at your next visit. If any glucose reading is under 80 or above 300 call your primary MD immediately.  If you have Seizure/Convulsions/Epilepsy: Please do not drive, operate heavy machinery, participate in activities at heights or participate in high speed sports until you have seen by Primary MD or a Neurologist and advised to do so again.  If you had Gastrointestinal Bleeding: Please ask your Primary MD to check a complete blood count within one week of discharge or at your next visit. Your endoscopic/colonoscopic biopsies that are pending at the time of discharge, will also need to followed by your Primary MD.  Get Medicines reviewed and adjusted. Please take all your medications with you for your next visit with your Primary MD  Please request your Primary MD to go over all hospital tests and procedure/radiological results at the follow up, please ask your  Primary MD to get all Hospital records sent to his/her office.  If you experience worsening of your admission symptoms, develop shortness of breath, life threatening emergency, suicidal or homicidal thoughts you must seek medical attention immediately by calling 911 or calling your MD immediately  if symptoms less severe.  You must read complete instructions/literature along with all the possible adverse reactions/side effects for all the Medicines you take and that have been prescribed to you. Take any new Medicines after you have completely understood and accpet all the possible adverse reactions/side effects.   Do not drive or operate heavy machinery when taking Pain medications.   Do not take more than prescribed Pain, Sleep and Anxiety Medications  Special Instructions: If you have smoked or chewed Tobacco  in the last 2 yrs please stop smoking, stop any regular Alcohol  and or any Recreational drug use.  Wear Seat belts while driving.  Please note You were cared for by a hospitalist during your hospital stay. If you have any questions about your discharge medications or the care you received while you were in the hospital after you are discharged, you can call the unit and asked to speak with the hospitalist on call if the hospitalist that took care of you is not available. Once you are discharged, your primary care physician will handle any further medical issues. Please note that NO REFILLS for any discharge medications will be authorized once you are discharged, as it is imperative that you  return to your primary care physician (or establish a relationship with a primary care physician if you do not have one) for your aftercare needs so that they can reassess your need for medications and monitor your lab values.  You can reach the hospitalist office at phone [phone] or fax [phone]   If you do not have a primary care physician, you can call [phone] for a physician  referral.   Chronic Obstructive Pulmonary Disease Exacerbation Chronic obstructive pulmonary disease (COPD) is a common lung problem. In COPD, the flow of air from the lungs is limited. COPD exacerbations are times that breathing gets worse and you need extra treatment. Without treatment they can be life threatening. If they happen often, your lungs can become more damaged. If your COPD gets worse, your doctor may treat you with:  Medicines.  Oxygen.  Different ways to clear your airway, such as using a mask.  Follow these instructions at home:  Do not smoke.  Avoid tobacco smoke and other things that bother your lungs.  If given, take your antibiotic medicine as told. Finish the medicine even if you start to feel better.  Only take medicines as told by your doctor.  Drink enough fluids to keep your pee (urine) clear or pale yellow (unless your doctor has told you not to).  Use a cool mist machine (vaporizer).  If you use oxygen or a machine that turns liquid medicine into a mist (nebulizer), continue to use them as told.  Keep up with shots (vaccinations) as told by your doctor.  Exercise regularly.  Eat healthy foods.  Keep all doctor visits as told. Get help right away if:  You are very short of breath and it gets worse.  You have trouble talking.  You have bad chest pain.  You have blood in your spit (sputum).  You have a fever.  You keep throwing up (vomiting).  You feel weak, or you pass out (faint).  You feel confused.  You keep getting worse. This information is not intended to replace advice given to you by your health care provider. Make sure you discuss any questions you have with your health care provider. Document Released: 02/12/2011 Document Revised: 08/01/2015 Document Reviewed: 10/28/2012 Elsevier Interactive Patient Education  2017 [employer].

## 2017-05-07 ENCOUNTER — Encounter (HOSPITAL_COMMUNITY): Payer: Self-pay | Admitting: Surgery

## 2017-05-07 DIAGNOSIS — G4733 Obstructive sleep apnea (adult) (pediatric): Secondary | ICD-10-CM | POA: Diagnosis not present

## 2017-05-07 LAB — CULTURE, RESPIRATORY: Culture: NORMAL

## 2017-05-07 LAB — CULTURE, RESPIRATORY W GRAM STAIN

## 2017-05-10 ENCOUNTER — Telehealth (HOSPITAL_COMMUNITY): Payer: Self-pay | Admitting: *Deleted

## 2017-05-10 DIAGNOSIS — Z6841 Body Mass Index (BMI) 40.0 and over, adult: Secondary | ICD-10-CM | POA: Diagnosis not present

## 2017-05-10 DIAGNOSIS — J449 Chronic obstructive pulmonary disease, unspecified: Secondary | ICD-10-CM | POA: Diagnosis not present

## 2017-05-10 DIAGNOSIS — E875 Hyperkalemia: Secondary | ICD-10-CM | POA: Diagnosis not present

## 2017-05-10 DIAGNOSIS — E1165 Type 2 diabetes mellitus with hyperglycemia: Secondary | ICD-10-CM | POA: Diagnosis not present

## 2017-05-11 ENCOUNTER — Encounter (HOSPITAL_COMMUNITY)
Admission: RE | Admit: 2017-05-11 | Discharge: 2017-05-11 | Disposition: A | Discharge status: Home / Self Care | Payer: Medicare HMO | Source: Ambulatory Visit | Attending: Pulmonary Disease | Admitting: Pulmonary Disease

## 2017-05-11 DIAGNOSIS — J449 Chronic obstructive pulmonary disease, unspecified: Secondary | ICD-10-CM | POA: Insufficient documentation

## 2017-05-11 NOTE — Progress Notes (Signed)
Daily Session Note  Patient Details  Name: Kathleen Hatfield MRN: [id number] Date of Birth: 12-Jul-1955 Referring Provider:     Pulmonary Rehab Walk Test from 04/01/2017 in [place]  Referring Provider  Dr. Halford Chessman      Encounter Date: 04/20/2017  Check In:   Capillary Blood Glucose: No results found for this or any previous visit (from the past 24 hour(s)).    Social History   Tobacco Use  Smoking Status Never Smoker  Smokeless Tobacco Never Used    Goals Met:  Exercise tolerated well Strength training completed today  Goals Unmet:  Not Applicable  Comments: Service time is from 1330 to 1510    Dr. Rush Farmer is Medical Director for Pulmonary Rehab at [facility].

## 2017-05-13 ENCOUNTER — Encounter (HOSPITAL_COMMUNITY): Payer: Medicare HMO

## 2017-05-17 NOTE — Progress Notes (Signed)
Pulmonary Individual Treatment Plan  Patient Details  Name: Kathleen Hatfield MRN: [id number] Date of Birth: 06-30-1955 Referring Provider:     Pulmonary Rehab Walk Test from 04/01/2017 in [place]  Referring Provider  Dr. Halford Chessman      Initial Encounter Date:    Pulmonary Rehab Walk Test from 04/01/2017 in [place]  Date  04/01/17  Referring Provider  Dr. 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  azithromycin (ZITHROMAX) 250 MG tablet, Take 1 tablet (250 mg total) by mouth daily., Disp: 2 each, Rfl: 0 .  BAYER CONTOUR NEXT TEST test strip, As directed, Disp: , Rfl:  .  canagliflozin (INVOKANA) 300 MG TABS tablet, Take 300 mg by mouth daily before breakfast., Disp: , Rfl:  .  Cholecalciferol (VITAMIN D-3) 5000 UNITS TABS, Take 5,000 Units by mouth daily. , Disp: , Rfl:  .  DULoxetine (CYMBALTA) 60 MG capsule, Take 60 mg by mouth 2 (two) times daily. , Disp: , Rfl:  .  estradiol (ESTRACE) 2 MG tablet, Take 1 tablet (2 mg total) by mouth daily., Disp: 90 tablet, Rfl: 3 .  fenofibrate (TRICOR) 145 MG tablet, Take 145 mg by mouth at bedtime. , Disp: , Rfl: 11 .  folic acid (FOLVITE) 1 MG tablet, Take 1 mg by mouth 2 (two) times daily., Disp: , Rfl:  .  furosemide (LASIX) 40 MG tablet, Take 40 mg by mouth daily., Disp: , Rfl:  .  gabapentin (NEURONTIN) 100 MG capsule, Take 200-400 mg by mouth See admin instructions. Take 2 capsules every morning and take 4 capsules at bedtime, Disp: , Rfl:  .  GLOBAL EASE INJECT PEN NEEDLES 32G X 4 MM MISC, , Disp: , Rfl:  .  guaiFENesin-dextromethorphan (ROBITUSSIN DM) 100-10 MG/5ML syrup, Take 5 mLs by mouth every 6 (six) hours as needed for cough., Disp: 118 mL, Rfl: 0 .  Liraglutide (VICTOZA) 18 MG/3ML SOPN, Inject 1.2 mg into the skin  daily with lunch. , Disp: , Rfl:  .  MICROLET LANCETS MISC, As directed, Disp: , Rfl:  .  pantoprazole (PROTONIX) 40 MG tablet, Take 40 mg by mouth daily. , Disp: , Rfl:  .  predniSONE (DELTASONE) 10 MG tablet, Take 4 tabs daily for 2 days, then 3 tabs daily for 2 days, then 2 tabs daily for 2 days, then 1 tab daily for 2 days, then stop., Disp: 20 tablet, Rfl: 0 .  PROAIR HFA 108 (90 Base) MCG/ACT inhaler, INHALE 2 PUFFS INTO THE LUNGS EVERY FOUR HOURS AS NEEDED FOR WHEEZING., Disp: 8.5 g, Rfl: 5 .  rOPINIRole (REQUIP) 4 MG tablet, Take 4 mg by mouth 2 (two) times daily. , Disp: , Rfl:  .  spironolactone (ALDACTONE) 25 MG tablet, Take 25 mg by mouth daily., Disp: , Rfl:  .  TOUJEO SOLOSTAR 300 UNIT/ML SOPN, Inject 70 Units into the skin daily with lunch. , Disp: , Rfl:  .  UNABLE TO FIND, CPAP at bedtime, Disp: , Rfl:   Past Medical History: Past Medical History:  Diagnosis Date  . Anemia   . Arthritis   . Asthma   . CHF (congestive heart failure) ([place]) 05/2014  . Chronic diastolic heart failure ([place])   . CKD (chronic kidney disease)   . Depression   . Diabetes mellitus without complication ([place])   .  Diverticulitis   . Dyspnea    with exertion  . GERD (gastroesophageal reflux disease)   . History of blood transfusion   . Hyperlipidemia    cannot tolerate statins  . Hypertension   . Peripheral neuropathy   . Pneumonia   . PONV (postoperative nausea and vomiting)   . Sleep apnea    uses CPAP  . Ulcerative colitis ([place])    dr Collene Mares    Tobacco Use: Social History   Tobacco Use  Smoking Status Never Smoker  Smokeless Tobacco Never Used    Labs: Recent Review Flowsheet Data    Labs for ITP Cardiac and Pulmonary Rehab Latest Ref Rng & Units 10/21/2015 10/22/2015 04/02/2016 04/03/2016 05/04/2017   Cholestrol 0 - 200 mg/dL - - - - -   LDLCALC 0 - 99 mg/dL - - - - -   HDL >39 mg/dL - - - - -   Trlycerides <150 mg/dL - - - - -   Hemoglobin A1c 4.8 - 5.6 % 7.4(H) - 7.6(H) - 8.5(H)    PHART 7.350 - 7.450 - 7.438 - 7.456(H) -   PCO2ART 32.0 - 48.0 mmHg - 41.0 - 45.2 -   HCO3 20.0 - 28.0 mmol/L - 27.3(H) - 31.4(H) -   TCO2 0 - 100 mmol/L - 25.1 - - -   O2SAT % - 95.7 - 90.6 -      Capillary Blood Glucose: Lab Results  Component Value Date   GLUCAP 204 (H) 05/06/2017   GLUCAP 266 (H) 05/06/2017   GLUCAP 221 (H) 05/05/2017   GLUCAP 211 (H) 05/05/2017   GLUCAP 291 (H) 05/05/2017   POCT Glucose    Row Name 04/13/17 1531 04/15/17 1610           POCT Blood Glucose   Pre-Exercise  216 mg/dL  155 mg/dL      Post-Exercise  173 mg/dL  125 mg/dL         Pulmonary Assessment Scores: Pulmonary Assessment Scores    Row Name 03/31/17 1511 04/02/17 0710 04/14/17 1443     ADL UCSD   ADL Phase  Entry  Entry  -   SOB Score total  97  -  -     CAT Score   CAT Score  97 Entry  -  30 97 mistake this is the correct entry score     mMRC Score   mMRC Score  -  4  -      Pulmonary Function Assessment:   Exercise Target Goals:    Exercise Program Goal: Individual exercise prescription set using results from initial 6 min walk test and THRR while considering  patient's activity barriers and safety.    Exercise Prescription Goal: Initial exercise prescription builds to 30-45 minutes a day of aerobic activity, 2-3 days per week.  Home exercise guidelines will be given to patient during program as part of exercise prescription that the participant will acknowledge.  Activity Barriers & Risk Stratification:   6 Minute Walk: 6 Minute Walk    Row Name 04/02/17 0711         6 Minute Walk   Phase  Initial     Distance  500 feet     Walk Time  - 5 minutes 22 seconds     # of Rest Breaks  - 1 standing break lasting 38 seconds     MPH  1     METS  1.7     RPE  14  Perceived Dyspnea   4     Symptoms  Yes (comment)     Comments  Used rollator-back pain 8/10      Resting HR  100 bpm     Resting BP  148/91     Resting Oxygen Saturation   93 %     Exercise  Oxygen Saturation  during 6 min walk  92 %     Max Ex. HR  115 bpm     Max Ex. BP  144/82       Interval HR   1 Minute HR  110     2 Minute HR  116     3 Minute HR  116     4 Minute HR  115     5 Minute HR  114     6 Minute HR  114     2 Minute Post HR  105     Interval Heart Rate?  Yes       Interval Oxygen   Interval Oxygen?  Yes     Baseline Oxygen Saturation %  93 %     1 Minute Oxygen Saturation %  92 %     1 Minute Liters of Oxygen  0 L     2 Minute Oxygen Saturation %  93 %     2 Minute Liters of Oxygen  0 L     3 Minute Oxygen Saturation %  93 %     3 Minute Liters of Oxygen  0 L     4 Minute Oxygen Saturation %  93 %     4 Minute Liters of Oxygen  0 L     5 Minute Oxygen Saturation %  94 %     5 Minute Liters of Oxygen  0 L     6 Minute Oxygen Saturation %  94 %     6 Minute Liters of Oxygen  0 L     2 Minute Post Oxygen Saturation %  96 %     2 Minute Post Liters of Oxygen  0 L        Oxygen Initial Assessment: Oxygen Initial Assessment - 04/02/17 0708      Initial 6 min Walk   Oxygen Used  None      Program Oxygen Prescription   Program Oxygen Prescription  None       Oxygen Re-Evaluation: Oxygen Re-Evaluation    Row Name 04/16/17 1150 05/17/17 0904           Program Oxygen Prescription   Program Oxygen Prescription  None  None        Home Oxygen   Home Oxygen Device  None  None      Sleep Oxygen Prescription  CPAP  CPAP      Home Exercise Oxygen Prescription  None  None      Home at Rest Exercise Oxygen Prescription  None  None      Compliance with Home Oxygen Use  Yes  Yes         Oxygen Discharge (Final Oxygen Re-Evaluation): Oxygen Re-Evaluation - 05/17/17 0904      Program Oxygen Prescription   Program Oxygen Prescription  None      Home Oxygen   Home Oxygen Device  None    Sleep Oxygen Prescription  CPAP    Home Exercise Oxygen Prescription  None    Home at Rest Exercise Oxygen Prescription  None    Compliance with  Home  Oxygen Use  Yes       Initial Exercise Prescription: Initial Exercise Prescription - 04/02/17 0700      Date of Initial Exercise RX and Referring Provider   Date  04/01/17    Referring Provider  Dr. Halford Chessman      NuStep   Level  1    SPM  70    Minutes  17    METs  1.5      Track   Laps  3    Minutes  17      Prescription Details   Frequency (times per week)  2    Duration  Progress to 45 minutes of aerobic exercise without signs/symptoms of physical distress      Intensity   THRR 40-80% of Max Heartrate  63-126    Ratings of Perceived Exertion  11-13    Perceived Dyspnea  0-4      Progression   Progression  Continue progressive overload as per policy without signs/symptoms or physical distress.      Resistance Training   Training Prescription  Yes    Weight  orange bands    Reps  10-15       Perform Capillary Blood Glucose checks as needed.  Exercise Prescription Changes: Exercise Prescription Changes    Row Name 04/13/17 1531 04/20/17 1541           Response to Exercise   Blood Pressure (Admit)  126/64  120/56      Blood Pressure (Exercise)  130/62  134/62      Blood Pressure (Exit)  140/68  108/68      Heart Rate (Admit)  90 bpm  90 bpm      Heart Rate (Exercise)  102 bpm  101 bpm      Heart Rate (Exit)  98 bpm  99 bpm      Oxygen Saturation (Admit)  93 %  94 %      Oxygen Saturation (Exercise)  94 %  92 %      Oxygen Saturation (Exit)  95 %  94 %      Rating of Perceived Exertion (Exercise)  13  17      Perceived Dyspnea (Exercise)  2  3      Duration  Progress to 45 minutes of aerobic exercise without signs/symptoms of physical distress  Progress to 45 minutes of aerobic exercise without signs/symptoms of physical distress      Intensity  THRR unchanged  THRR unchanged        Progression   Progression  Continue to progress workloads to maintain intensity without signs/symptoms of physical distress.  Continue to progress workloads to maintain intensity  without signs/symptoms of physical distress.        Resistance Training   Training Prescription  Yes  Yes      Weight  orange bands  orange bands      Reps  10-15  10-15      Time  -  10 Minutes        Oxygen   Oxygen  - room air  -        NuStep   Level  1  2      SPM  70  70      Minutes  51  34      METs  1.8  1.9        Track   Laps  3  4  Minutes  17  17         Exercise Comments:   Exercise Goals and Review:   Exercise Goals Re-Evaluation : Exercise Goals Re-Evaluation    Row Name 04/16/17 1151 05/17/17 0904           Exercise Goal Re-Evaluation   Exercise Goals Review  Increase Strength and Stamina;Able to understand and use Dyspnea scale;Able to understand and use rate of perceived exertion (RPE) scale;Increase Physical Activity;Knowledge and understanding of Target Heart Rate Range (THRR);Understanding of Exercise Prescription  Increase Strength and Stamina;Able to understand and use Dyspnea scale;Able to understand and use rate of perceived exertion (RPE) scale;Increase Physical Activity;Knowledge and understanding of Target Heart Rate Range (THRR);Understanding of Exercise Prescription      Comments  Patient has only attended three rehab sessions. Will cont. to educate and encourage.   Patient has only attended 4 rehab sessions. Has been out with flu since 04/20/17. Will cont to monitor and progress as tolerated once she returns to rehab.       Expected Outcomes  Through exercise at rehab and at home, patient will increase strength and stamina making ADL's easier to perform. Patient will also have a better understanding of safe exercise and what they are capable to do outside of clinical supervision.  Through exercise at rehab and at home, patient will increase strength and stamina making ADL's easier to perform. Patient will also have a better understanding of safe exercise and what they are capable to do outside of clinical supervision.         Discharge  Exercise Prescription (Final Exercise Prescription Changes): Exercise Prescription Changes - 04/20/17 1541      Response to Exercise   Blood Pressure (Admit)  120/56    Blood Pressure (Exercise)  134/62    Blood Pressure (Exit)  108/68    Heart Rate (Admit)  90 bpm    Heart Rate (Exercise)  101 bpm    Heart Rate (Exit)  99 bpm    Oxygen Saturation (Admit)  94 %    Oxygen Saturation (Exercise)  92 %    Oxygen Saturation (Exit)  94 %    Rating of Perceived Exertion (Exercise)  17    Perceived Dyspnea (Exercise)  3    Duration  Progress to 45 minutes of aerobic exercise without signs/symptoms of physical distress    Intensity  THRR unchanged      Progression   Progression  Continue to progress workloads to maintain intensity without signs/symptoms of physical distress.      Resistance Training   Training Prescription  Yes    Weight  orange bands    Reps  10-15    Time  10 Minutes      NuStep   Level  2    SPM  70    Minutes  34    METs  1.9      Track   Laps  4    Minutes  17       Nutrition:  Target Goals: Understanding of nutrition guidelines, daily intake of sodium <1537m, cholesterol <2012m calories 30% from fat and 7% or less from saturated fats, daily to have 5 or more servings of fruits and vegetables.  Biometrics: Pre Biometrics - 03/29/17 1346      Pre Biometrics   Grip Strength  32 kg        Nutrition Therapy Plan and Nutrition Goals: Nutrition Therapy & Goals - 04/15/17 1524  Nutrition Therapy   Diet  Carb Modified, Heart Healthy      Personal Nutrition Goals   Nutrition Goal  Identify food quantities necessary to achieve wt loss of  -2# per week to a goal wt loss of 6-24 lb at graduation from pulmonary rehab.    Personal Goal #2  CBG's in the normal range or as close to normal as is safely possible.      Intervention Plan   Intervention  Prescribe, educate and counsel regarding individualized specific dietary modifications aiming towards  targeted core components such as weight, hypertension, lipid management, diabetes, heart failure and other comorbidities.    Expected Outcomes  Short Term Goal: Understand basic principles of dietary content, such as calories, fat, sodium, cholesterol and nutrients.;Long Term Goal: Adherence to prescribed nutrition plan.       Nutrition Assessments: Nutrition Assessments - 04/15/17 1524      Rate Your Plate Scores   Pre Score  53       Nutrition Goals Re-Evaluation:   Nutrition Goals Discharge (Final Nutrition Goals Re-Evaluation):   Psychosocial: Target Goals: Acknowledge presence or absence of significant depression and/or stress, maximize coping skills, provide positive support system. Participant is able to verbalize types and ability to use techniques and skills needed for reducing stress and depression.  Initial Review & Psychosocial Screening: Initial Psych Review & Screening - 03/29/17 1308      Initial Review   Current issues with  Current Depression;History of Depression;Current Psychotropic Meds;Current Sleep Concerns      Family Dynamics   Good Support System?  Yes      Barriers   Psychosocial barriers to participate in program  Psychosocial barriers identified (see note) her illnesses are her source of stress      Screening Interventions   Interventions  Encouraged to exercise    Expected Outcomes  Short Term goal: Utilizing psychosocial counselor, staff and physician to assist with identification of specific Stressors or current issues interfering with healing process. Setting desired goal for each stressor or current issue identified.;Long Term Goal: Stressors or current issues are controlled or eliminated.;Short Term goal: Identification and review with participant of any Quality of Life or Depression concerns found by scoring the questionnaire.;Long Term goal: The participant improves quality of Life and PHQ9 Scores as seen by post scores and/or verbalization of  changes       Quality of Life Scores:  Scores of 19 and below usually indicate a poorer quality of life in these areas.  A difference of  2-3 points is a clinically meaningful difference.  A difference of 2-3 points in the total score of the Quality of Life Index has been associated with significant improvement in overall quality of life, self-image, physical symptoms, and general health in studies assessing change in quality of life.   PHQ-9: Recent Review Flowsheet Data    Depression screen [facility] 2/9 03/29/2017   Decreased Interest 3   Down, Depressed, Hopeless 2   PHQ - 2 Score 5   Altered sleeping 3   Tired, decreased energy 3   Change in appetite 1   Feeling bad or failure about yourself  3   Trouble concentrating 3   Moving slowly or fidgety/restless 2   Suicidal thoughts 0   PHQ-9 Score 20   Difficult doing work/chores Extremely dIfficult     Interpretation of Total Score  Total Score Depression Severity:  1-4 = Minimal depression, 5-9 = Mild depression, 10-14 = Moderate depression, 15-19 =  Moderately severe depression, 20-27 = Severe depression   Psychosocial Evaluation and Intervention: Psychosocial Evaluation - 03/29/17 1310      Psychosocial Evaluation & Interventions   Interventions  Encouraged to exercise with the program and follow exercise prescription    Comments  Is taking cymbalta and this helps with depression, cannot afford a psychologist and sees her PCP for the depression    Continue Psychosocial Services   Follow up required by staff       Psychosocial Re-Evaluation: Psychosocial Re-Evaluation    [place] Name 04/19/17 0922 05/10/17 1207           Psychosocial Re-Evaluation   Current issues with  Current Depression;History of Depression  Current Depression;History of Depression      Comments  No change re: depression since orientation  No change re: depression since orientation.  Is on medication for depression and is stable at this time.      Expected  Outcomes  no barriers to participation in pulmonary rehab  no barriers to participation in pulmonary rehab      Interventions  Encouraged to attend Pulmonary Rehabilitation for the exercise  Encouraged to attend Pulmonary Rehabilitation for the exercise      Continue Psychosocial Services   Follow up required by staff  Follow up required by staff         Psychosocial Discharge (Final Psychosocial Re-Evaluation): Psychosocial Re-Evaluation - 05/10/17 1207      Psychosocial Re-Evaluation   Current issues with  Current Depression;History of Depression    Comments  No change re: depression since orientation.  Is on medication for depression and is stable at this time.    Expected Outcomes  no barriers to participation in pulmonary rehab    Interventions  Encouraged to attend Pulmonary Rehabilitation for the exercise    Continue Psychosocial Services   Follow up required by staff       Education: Education Goals: Education classes will be provided on a weekly basis, covering required topics. Participant will state understanding/return demonstration of topics presented.  Learning Barriers/Preferences: Learning Barriers/Preferences - 03/29/17 1301      Learning Barriers/Preferences   Learning Preferences  Computer/Internet;Written Material;Video;Verbal Instruction;Skilled Demonstration;Pictoral;Individual Instruction       Education Topics: Risk Factor Reduction:  -Group instruction that is supported by a PowerPoint presentation. Instructor discusses the definition of a risk factor, different risk factors for pulmonary disease, and how the heart and lungs work together.     Nutrition for Pulmonary Patient:  -Group instruction provided by PowerPoint slides, verbal discussion, and written materials to support subject matter. The instructor gives an explanation and review of healthy diet recommendations, which includes a discussion on weight management, recommendations for fruit and  vegetable consumption, as well as protein, fluid, caffeine, fiber, sodium, sugar, and alcohol. Tips for eating when patients are short of breath are discussed.   Pursed Lip Breathing:  -Group instruction that is supported by demonstration and informational handouts. Instructor discusses the benefits of pursed lip and diaphragmatic breathing and detailed demonstration on how to preform both.     Oxygen Safety:  -Group instruction provided by PowerPoint, verbal discussion, and written material to support subject matter. There is an overview of "What is Oxygen" and "Why do we need it".  Instructor also reviews how to create a safe environment for oxygen use, the importance of using oxygen as prescribed, and the risks of noncompliance. There is a brief discussion on traveling with oxygen and resources the patient may utilize.  PULMONARY REHAB CHRONIC OBSTRUCTIVE PULMONARY DISEASE from 04/15/2017 in [place]  Date  04/15/17  Educator  RN  Instruction Review Code  2- Demonstrated Understanding      Oxygen Equipment:  -Group instruction provided by [facility] Staff utilizing handouts, written materials, and equipment demonstrations.   Signs and Symptoms:  -Group instruction provided by written material and verbal discussion to support subject matter. Warning signs and symptoms of infection, stroke, and heart attack are reviewed and when to call the physician/911 reinforced. Tips for preventing the spread of infection discussed.   Advanced Directives:  -Group instruction provided by verbal instruction and written material to support subject matter. Instructor reviews Advanced Directive laws and proper instruction for filling out document.   Pulmonary Video:  -Group video education that reviews the importance of medication and oxygen compliance, exercise, good nutrition, pulmonary hygiene, and pursed lip and diaphragmatic breathing for the pulmonary  patient.   Exercise for the Pulmonary Patient:  -Group instruction that is supported by a PowerPoint presentation. Instructor discusses benefits of exercise, core components of exercise, frequency, duration, and intensity of an exercise routine, importance of utilizing pulse oximetry during exercise, safety while exercising, and options of places to exercise outside of rehab.     Pulmonary Medications:  -Verbally interactive group education provided by instructor with focus on inhaled medications and proper administration.   Anatomy and Physiology of the Respiratory System and Intimacy:  -Group instruction provided by PowerPoint, verbal discussion, and written material to support subject matter. Instructor reviews respiratory cycle and anatomical components of the respiratory system and their functions. Instructor also reviews differences in obstructive and restrictive respiratory diseases with examples of each. Intimacy, Sex, and Sexuality differences are reviewed with a discussion on how relationships can change when diagnosed with pulmonary disease. Common sexual concerns are reviewed.   MD DAY -A group question and answer session with a medical doctor that allows participants to ask questions that relate to their pulmonary disease state.   OTHER EDUCATION -Group or individual verbal, written, or video instructions that support the educational goals of the pulmonary rehab program.   Holiday Eating Survival Tips:  -Group instruction provided by PowerPoint slides, verbal discussion, and written materials to support subject matter. The instructor gives patients tips, tricks, and techniques to help them not only survive but enjoy the holidays despite the onslaught of food that accompanies the holidays.   Knowledge Questionnaire Score: Knowledge Questionnaire Score - 03/31/17 1510      Knowledge Questionnaire Score   Pre Score  16/18       Core Components/Risk Factors/Patient Goals  at Admission: Personal Goals and Risk Factors at Admission - 03/29/17 1306      Core Components/Risk Factors/Patient Goals on Admission    Weight Management  Obesity;Weight Loss    Improve shortness of breath with ADL's  Yes    Intervention  Provide education, individualized exercise plan and daily activity instruction to help decrease symptoms of SOB with activities of daily living.    Expected Outcomes  Short Term: Improve cardiorespiratory fitness to achieve a reduction of symptoms when performing ADLs;Long Term: Be able to perform more ADLs without symptoms or delay the onset of symptoms    Heart Failure  Yes    Intervention  Provide a combined exercise and nutrition program that is supplemented with education, support and counseling about heart failure. Directed toward relieving symptoms such as shortness of breath, decreased exercise tolerance, and extremity edema.  Expected Outcomes  Improve functional capacity of life;Short term: Attendance in program 2-3 days a week with increased exercise capacity. Reported lower sodium intake. Reported increased fruit and vegetable intake. Reports medication compliance.;Short term: Daily weights obtained and reported for increase. Utilizing diuretic protocols set by physician.;Long term: Adoption of self-care skills and reduction of barriers for early signs and symptoms recognition and intervention leading to self-care maintenance.    Stress  Yes    Intervention  Offer individual and/or small group education and counseling on adjustment to heart disease, stress management and health-related lifestyle change. Teach and support self-help strategies.;Refer participants experiencing significant psychosocial distress to appropriate mental health specialists for further evaluation and treatment. When possible, include family members and significant others in education/counseling sessions.    Expected Outcomes  Short Term: Participant demonstrates changes in  health-related behavior, relaxation and other stress management skills, ability to obtain effective social support, and compliance with psychotropic medications if prescribed.;Long Term: Emotional wellbeing is indicated by absence of clinically significant psychosocial distress or social isolation.       Core Components/Risk Factors/Patient Goals Review:  Goals and Risk Factor Review    Row Name 04/19/17 0918 05/10/17 1204           Core Components/Risk Factors/Patient Goals Review   Personal Goals Review  Weight Management/Obesity;Improve shortness of breath with ADL's;Develop more efficient breathing techniques such as purse lipped breathing and diaphragmatic breathing and practicing self-pacing with activity.;Stress  Weight Management/Obesity;Improve shortness of breath with ADL's;Develop more efficient breathing techniques such as purse lipped breathing and diaphragmatic breathing and practicing self-pacing with activity.;Stress      Review  Just started program, has attended 3 exercise sessions, multiple health issues, chronic pain in her hips and back have limited her from walking on the track.  In hopes she can do the program to graduation, but afraid her chronic pain will drastically slow her progress.  has attended 4 exercise sessions, was hospitalized last week with influenza, will be out this week to get her strength back and will return next week.  Due to illness unable to meet admission goals.      Expected Outcomes  Slow progression d/t chronic pain issues.  Slow progression d/t chronic pain issues and recent hospitalization.         Core Components/Risk Factors/Patient Goals at Discharge (Final Review):  Goals and Risk Factor Review - 05/10/17 1204      Core Components/Risk Factors/Patient Goals Review   Personal Goals Review  Weight Management/Obesity;Improve shortness of breath with ADL's;Develop more efficient breathing techniques such as purse lipped breathing and  diaphragmatic breathing and practicing self-pacing with activity.;Stress    Review  has attended 4 exercise sessions, was hospitalized last week with influenza, will be out this week to get her strength back and will return next week.  Due to illness unable to meet admission goals.    Expected Outcomes  Slow progression d/t chronic pain issues and recent hospitalization.       ITP Comments:   Comments: patient has attended 4 pulmonary rehab sessions since admission.

## 2017-05-18 ENCOUNTER — Encounter (HOSPITAL_COMMUNITY)
Admission: RE | Admit: 2017-05-18 | Discharge: 2017-05-18 | Disposition: A | Discharge status: Home / Self Care | Payer: Medicare HMO | Source: Ambulatory Visit | Attending: Pulmonary Disease | Admitting: Pulmonary Disease

## 2017-05-18 DIAGNOSIS — J449 Chronic obstructive pulmonary disease, unspecified: Secondary | ICD-10-CM | POA: Diagnosis not present

## 2017-05-18 DIAGNOSIS — J45902 Unspecified asthma with status asthmaticus: Secondary | ICD-10-CM

## 2017-05-18 NOTE — Progress Notes (Signed)
Daily Session Note  Patient Details  Name: SUNDAY KLOS MRN: [id number] Date of Birth: 11-14-1955 Referring Provider:     Pulmonary Rehab Walk Test from 04/01/2017 in [place]  Referring Provider  Dr. Halford Chessman      Encounter Date: 05/18/2017  Check In: Session Check In - 05/18/17 1559      Check-In   Location  [facility]    Staff Present  Rodney Langton, RN;Portia Rollene Rotunda, RN, BSN;Relena Ivancic, MS, ACSM RCEP, Exercise Physiologist    Supervising physician immediately available to respond to emergencies  [facility] immediately available    Physician(s)  Dr. Horris Latino    Medication changes reported      No    Fall or balance concerns reported     No    Tobacco Cessation  No Change    Warm-up and Cool-down  Performed as group-led instruction    Resistance Training Performed  Yes    VAD Patient?  No      Pain Assessment   Currently in Pain?  No/denies    Multiple Pain Sites  No       Capillary Blood Glucose: No results found for this or any previous visit (from the past 24 hour(s)).    Social History   Tobacco Use  Smoking Status Never Smoker  Smokeless Tobacco Never Used    Goals Met:  Exercise tolerated well No report of cardiac concerns or symptoms Strength training completed today  Goals Unmet:  Not Applicable  Comments: Service time is from 1:30p to 2:30p    Dr. Rush Farmer is Medical Director for Pulmonary Rehab at [facility].

## 2017-05-20 ENCOUNTER — Encounter (HOSPITAL_COMMUNITY)
Admission: RE | Admit: 2017-05-20 | Discharge: 2017-05-20 | Disposition: A | Discharge status: Home / Self Care | Payer: Medicare HMO | Source: Ambulatory Visit | Attending: Pulmonary Disease | Admitting: Pulmonary Disease

## 2017-05-20 DIAGNOSIS — J449 Chronic obstructive pulmonary disease, unspecified: Secondary | ICD-10-CM

## 2017-05-20 DIAGNOSIS — J45902 Unspecified asthma with status asthmaticus: Secondary | ICD-10-CM

## 2017-05-20 NOTE — Progress Notes (Signed)
Daily Session Note  Patient Details  Name: Kathleen Hatfield MRN: [id number] Date of Birth: 03-14-1955 Referring Provider:     Pulmonary Rehab Walk Test from 04/01/2017 in [place]  Referring Provider  Dr. Halford Chessman      Encounter Date: 05/20/2017  Check In: Session Check In - 05/20/17 1354      Check-In   Location  [facility]    Staff Present  Rodney Langton, RN;Portia Rollene Rotunda, RN, BSN;Molly diVincenzo, MS, ACSM RCEP, Exercise Physiologist    Supervising physician immediately available to respond to emergencies  [facility] immediately available    Physician(s)  Dr. Wendee Beavers    Medication changes reported      No    Fall or balance concerns reported     No    Tobacco Cessation  No Change    Warm-up and Cool-down  Performed as group-led instruction    Resistance Training Performed  Yes    VAD Patient?  No      Pain Assessment   Currently in Pain?  No/denies    Multiple Pain Sites  No       Capillary Blood Glucose: No results found for this or any previous visit (from the past 24 hour(s)).    Social History   Tobacco Use  Smoking Status Never Smoker  Smokeless Tobacco Never Used    Goals Met:  Exercise tolerated well No report of cardiac concerns or symptoms Strength training completed today  Goals Unmet:  Not Applicable  Comments: Service time is from 1330 to 1545    Dr. Rush Farmer is Medical Director for Pulmonary Rehab at [facility].

## 2017-05-21 ENCOUNTER — Encounter: Payer: Self-pay | Admitting: Adult Health

## 2017-05-21 ENCOUNTER — Ambulatory Visit: Payer: Medicare HMO | Admitting: Adult Health

## 2017-05-21 DIAGNOSIS — G4733 Obstructive sleep apnea (adult) (pediatric): Secondary | ICD-10-CM | POA: Diagnosis not present

## 2017-05-21 DIAGNOSIS — J4530 Mild persistent asthma, uncomplicated: Secondary | ICD-10-CM | POA: Diagnosis not present

## 2017-05-21 MED ORDER — ALBUTEROL SULFATE HFA 108 (90 BASE) MCG/ACT IN AERS: INHALATION_SPRAY | RESPIRATORY_TRACT | 5 refills | Status: DC

## 2017-05-21 MED ORDER — BUDESONIDE-FORMOTEROL FUMARATE 160-4.5 MCG/ACT IN AERO: 2.0000 | INHALATION_SPRAY | Freq: Two times a day (BID) | RESPIRATORY_TRACT | 5 refills | Status: DC

## 2017-05-21 MED ORDER — BUDESONIDE-FORMOTEROL FUMARATE 160-4.5 MCG/ACT IN AERO: 2.0000 | INHALATION_SPRAY | Freq: Two times a day (BID) | RESPIRATORY_TRACT | 0 refills | Status: DC

## 2017-05-21 NOTE — Assessment & Plan Note (Signed)
Recent exacerbation with flulike illness.  Now resolving Intolerant to powder inhalers  Plan  Patient Instructions  Begin Symbicort 160 2 puffs Twice daily  , rinse after use  May use ProAir 2 puffs every 4hr as needed, wheezing  Continue on CPAP at bedtime Follow-up with Dr. Halford Chessman in 2-3 months and As needed   Please contact office for sooner follow up if symptoms do not improve or worsen or seek emergency care

## 2017-05-21 NOTE — Progress Notes (Signed)
Reviewed and agree with assessment/plan.   Maylyn Narvaiz, MD [facility] 03/04/2016, 12:24 PM Pager:  [phone]  

## 2017-05-21 NOTE — Assessment & Plan Note (Signed)
Continue on CPAP at bedtime 

## 2017-05-21 NOTE — Addendum Note (Signed)
Addended by: Parke Poisson E on: 05/21/2017 03:59 PM   Modules accepted: Orders

## 2017-05-21 NOTE — Patient Instructions (Addendum)
Begin Symbicort 160 2 puffs Twice daily  , rinse after use  May use ProAir 2 puffs every 4hr as needed, wheezing  Continue on CPAP at bedtime Follow-up with Dr. Halford Chessman in 2-3 months and As needed   Please contact office for sooner follow up if symptoms do not improve or worsen or seek emergency care

## 2017-05-21 NOTE — Progress Notes (Signed)
@Patient  ID: Kathleen Hatfield, female    DOB: 05/10/1955, 62 y.o.   MRN: [id number]  Chief Complaint  Patient presents with  . Follow-up    copd    Referring provider: Myer Peer, MD  HPI: 62 year old female never smoker followed for chronic obstructive asthma and obstructive sleep apnea  TEST  Pulmonary tests: CT angio chest 02/10/13 >> small airway disease PFT 07/02/15 >> FEV1 1.50 (56%), FEV1% 69, TLC 4.99 (96%), DLCO 60%, +BD V/Q 04/05/16 >> normal  Sleep tests: PSG 09/01/12 >> AHI 36.1  Cardiac tests: Echo 04/03/16 >> EF 65 to 70%, mild LVH, grade 1 DD  05/21/2017 Follow up ; Chronic Asthma /COPD  Patient presents for a post hospital follow-up.  Patient was recently admitted to the hospital with a COPD exacerbation with flu like illness .  She was treated with Tamiflu, IV antibiotics steroids and nebulized bronchodilators. Since discharge patient is feeling She was previously on Advair, Incruse and most recently TRELEGY but says she has stopped these because she does not like the powder inhalers.      Allergies  Allergen Reactions  . Almond Oil Anaphylaxis  . Morphine And Related Shortness Of Breath and Other (See Comments)    Pt. States while in the hospital it affected her breathing, O2 dropped to the 70's  . Ceclor [Cefaclor] Nausea And Vomiting  . Ciprofloxacin Other (See Comments)    Makes joints and muscles ache  . Levaquin [Levofloxacin] Other (See Comments)    Body aches  . Sulfa Antibiotics Nausea And Vomiting    Immunization History  Administered Date(s) Administered  . Influenza Split 12/01/2012, 01/18/2017  . Influenza Whole 12/25/2011  . Influenza,inj,Quad PF,6+ Mos 11/27/2013, 12/08/2014, 12/11/2015  . Pneumococcal Conjugate-13 02/17/2017  . Pneumococcal Polysaccharide-23 11/27/2013    Past Medical History:  Diagnosis Date  . Anemia   . Arthritis   . Asthma   . CHF (congestive heart failure) ([place]) 05/2014  . Chronic diastolic heart  failure ([place])   . CKD (chronic kidney disease)   . Depression   . Diabetes mellitus without complication ([place])   . Diverticulitis   . Dyspnea    with exertion  . GERD (gastroesophageal reflux disease)   . History of blood transfusion   . Hyperlipidemia    cannot tolerate statins  . Hypertension   . Peripheral neuropathy   . Pneumonia   . PONV (postoperative nausea and vomiting)   . Sleep apnea    uses CPAP  . Ulcerative colitis ([place])    dr Collene Mares    Tobacco History: Social History   Tobacco Use  Smoking Status Never Smoker  Smokeless Tobacco Never Used   Counseling given: Not Answered   Outpatient Encounter Medications as of 05/21/2017  Medication Sig  . BAYER CONTOUR NEXT TEST test strip As directed  . Cholecalciferol (VITAMIN D-3) 5000 UNITS TABS Take 5,000 Units by mouth daily.   . DULoxetine (CYMBALTA) 60 MG capsule Take 60 mg by mouth 2 (two) times daily.   . empagliflozin (JARDIANCE) 25 MG TABS tablet Take 25 mg by mouth daily.  Marland Kitchen estradiol (ESTRACE) 2 MG tablet Take 1 tablet (2 mg total) by mouth daily.  . fenofibrate (TRICOR) 145 MG tablet Take 145 mg by mouth at bedtime.   . folic acid (FOLVITE) 1 MG tablet Take 1 mg by mouth 2 (two) times daily.  . furosemide (LASIX) 40 MG tablet Take 40 mg by mouth daily.  Marland Kitchen gabapentin (NEURONTIN) 100 MG capsule  Take 200-400 mg by mouth See admin instructions. Take 2 capsules every morning and take 4 capsules at bedtime  . GLOBAL EASE INJECT PEN NEEDLES 32G X 4 MM MISC   . guaiFENesin-dextromethorphan (ROBITUSSIN DM) 100-10 MG/5ML syrup Take 5 mLs by mouth every 6 (six) hours as needed for cough.  . Liraglutide (VICTOZA) 18 MG/3ML SOPN Inject 1.2 mg into the skin daily with lunch.   Marland Kitchen MICROLET LANCETS MISC As directed  . pantoprazole (PROTONIX) 40 MG tablet Take 40 mg by mouth daily.   Marland Kitchen PROAIR HFA 108 (90 Base) MCG/ACT inhaler INHALE 2 PUFFS INTO THE LUNGS EVERY FOUR HOURS AS NEEDED FOR WHEEZING.  . rOPINIRole (REQUIP) 4 MG  tablet Take 4 mg by mouth 2 (two) times daily.   Marland Kitchen spironolactone (ALDACTONE) 25 MG tablet Take 25 mg by mouth daily.  Nelva Nay SOLOSTAR 300 UNIT/ML SOPN Inject 70 Units into the skin daily with lunch.   Marland Kitchen UNABLE TO FIND CPAP at bedtime  . budesonide-formoterol (SYMBICORT) 160-4.5 MCG/ACT inhaler Inhale 2 puffs into the lungs every 12 (twelve) hours.  . [DISCONTINUED] azithromycin (ZITHROMAX) 250 MG tablet Take 1 tablet (250 mg total) by mouth daily. (Patient not taking: Reported on 05/21/2017)  . [DISCONTINUED] canagliflozin (INVOKANA) 300 MG TABS tablet Take 300 mg by mouth daily before breakfast.  . [DISCONTINUED] predniSONE (DELTASONE) 10 MG tablet Take 4 tabs daily for 2 days, then 3 tabs daily for 2 days, then 2 tabs daily for 2 days, then 1 tab daily for 2 days, then stop. (Patient not taking: Reported on 05/21/2017)   No facility-administered encounter medications on file as of 05/21/2017.      Review of Systems  Constitutional:   No  weight loss, night sweats,  Fevers, chills, fatigue, or  lassitude.  HEENT:   No headaches,  Difficulty swallowing,  Tooth/dental problems, or  Sore throat,                No sneezing, itching, ear ache, nasal congestion, post nasal drip,   CV:  No chest pain,  Orthopnea, PND, swelling in lower extremities, anasarca, dizziness, palpitations, syncope.   GI  No heartburn, indigestion, abdominal pain, nausea, vomiting, diarrhea, change in bowel habits, loss of appetite, bloody stools.   Resp: No shortness of breath with exertion or at rest.  No excess mucus, no productive cough,  No non-productive cough,  No coughing up of blood.  No change in color of mucus.  No wheezing.  No chest wall deformity  Skin: no rash or lesions.  GU: no dysuria, change in color of urine, no urgency or frequency.  No flank pain, no hematuria   MS:  No joint pain or swelling.  No decreased range of motion.  No back pain.    Physical Exam  BP 132/76 (BP Location: Left Arm,  Cuff Size: Large)   Pulse 94   Ht 5' 5"  (1.651 m)   Wt 267 lb 9.6 oz (121.4 kg)   SpO2 93%   BMI 44.53 kg/m   GEN: A/Ox3; pleasant , NAD, well nourished    HEENT:  [place]/AT,  EACs-clear, TMs-wnl, NOSE-clear, THROAT-clear, no lesions, no postnasal drip or exudate noted.   NECK:  Supple w/ fair ROM; no JVD; normal carotid impulses w/o bruits; no thyromegaly or nodules palpated; no lymphadenopathy.    RESP  Clear  P & A; w/o, wheezes/ rales/ or rhonchi. no accessory muscle use, no dullness to percussion  CARD:  RRR, no m/r/g, no peripheral  edema, pulses intact, no cyanosis or clubbing.  GI:   Soft & nt; nml bowel sounds; no organomegaly or masses detected.   Musco: Warm bil, no deformities or joint swelling noted.   Neuro: alert, no focal deficits noted.    Skin: Warm, no lesions or rashes    Lab Results:  CBC  BNP  Imaging: Dg Chest 2 View  Result Date: 05/03/2017 CLINICAL DATA:  Worsening shortness of breath. EXAM: CHEST  2 VIEW COMPARISON:  09/12/2016. FINDINGS: Lungs are hyperexpanded. Interstitial markings are diffusely coarsened with chronic features. Cardiopericardial silhouette is at upper limits of normal for size. The visualized bony structures of the thorax are intact. IMPRESSION: Stable.  Hyperexpansion without acute cardiopulmonary findings. Electronically Signed   By: Misty Stanley M.D.   On: 05/03/2017 13:43     Assessment & Plan:   Mild persistent chronic asthma without complication Recent exacerbation with flulike illness.  Now resolving Intolerant to powder inhalers  Plan  Patient Instructions  Begin Symbicort 160 2 puffs Twice daily  , rinse after use  May use ProAir 2 puffs every 4hr as needed, wheezing  Continue on CPAP at bedtime Follow-up with Dr. Halford Chessman in 2-3 months and As needed   Please contact office for sooner follow up if symptoms do not improve or worsen or seek emergency care         OSA (obstructive sleep apnea) Continue on CPAP at  bedtime     Rexene Edison, NP 05/21/2017

## 2017-05-25 ENCOUNTER — Encounter (HOSPITAL_COMMUNITY)
Admission: RE | Admit: 2017-05-25 | Discharge: 2017-05-25 | Disposition: A | Discharge status: Home / Self Care | Payer: Medicare HMO | Source: Ambulatory Visit | Attending: Pulmonary Disease | Admitting: Pulmonary Disease

## 2017-05-25 DIAGNOSIS — J449 Chronic obstructive pulmonary disease, unspecified: Secondary | ICD-10-CM

## 2017-05-25 DIAGNOSIS — J45902 Unspecified asthma with status asthmaticus: Secondary | ICD-10-CM

## 2017-05-25 NOTE — Progress Notes (Signed)
Daily Session Note  Patient Details  Name: Kathleen Hatfield MRN: [id number] Date of Birth: 24-Nov-1955 Referring Provider:     Pulmonary Rehab Walk Test from 04/01/2017 in [place]  Referring Provider  Dr. Halford Chessman      Encounter Date: 05/25/2017  Check In: Session Check In - 05/25/17 1330      Check-In   Location  [facility]    Staff Present  Rosebud Poles, RN, BSN;Molly diVincenzo, MS, ACSM RCEP, Exercise Physiologist;Annedrea Rosezella [place], RN, MHA;Portia Rollene Rotunda, RN, BSN    Supervising physician immediately available to respond to emergencies  [facility] immediately available    Physician(s)  Dr. Wendee Beavers    Medication changes reported      No    Fall or balance concerns reported     No    Tobacco Cessation  No Change    Warm-up and Cool-down  Performed as group-led instruction    Resistance Training Performed  Yes    VAD Patient?  No      Pain Assessment   Currently in Pain?  No/denies    Multiple Pain Sites  No       Capillary Blood Glucose: No results found for this or any previous visit (from the past 24 hour(s)).    Social History   Tobacco Use  Smoking Status Never Smoker  Smokeless Tobacco Never Used    Goals Met:  Exercise tolerated well Strength training completed today  Goals Unmet:  Not Applicable  Comments: Service time is from 1330 to 1515.    Dr. Rush Farmer is Medical Director for Pulmonary Rehab at [facility].

## 2017-05-27 ENCOUNTER — Encounter (HOSPITAL_COMMUNITY)
Admission: RE | Admit: 2017-05-27 | Discharge: 2017-05-27 | Disposition: A | Discharge status: Home / Self Care | Payer: Medicare HMO | Source: Ambulatory Visit | Attending: Pulmonary Disease | Admitting: Pulmonary Disease

## 2017-05-27 VITALS — Wt 271.2 lb

## 2017-05-27 DIAGNOSIS — J449 Chronic obstructive pulmonary disease, unspecified: Secondary | ICD-10-CM

## 2017-05-27 DIAGNOSIS — J45902 Unspecified asthma with status asthmaticus: Secondary | ICD-10-CM

## 2017-05-27 NOTE — Progress Notes (Signed)
Daily Session Note  Patient Details  Name: Kathleen Hatfield MRN: [id number] Date of Birth: 1955/06/24 Referring Provider:     Pulmonary Rehab Walk Test from 04/01/2017 in [place]  Referring Provider  Dr. Halford Chessman      Encounter Date: 05/27/2017  Check In: Session Check In - 05/27/17 1334      Check-In   Location  [facility]    Staff Present  Rosebud Poles, RN, BSN;Molly diVincenzo, MS, ACSM RCEP, Exercise Physiologist;Portia Rollene Rotunda, RN, Roque Cash, RN    Supervising physician immediately available to respond to emergencies  [facility] immediately available    Physician(s)  Dr. Lonny Prude    Medication changes reported      No    Fall or balance concerns reported     No    Tobacco Cessation  No Change    Warm-up and Cool-down  Performed as group-led instruction    Resistance Training Performed  Yes    VAD Patient?  No      Pain Assessment   Currently in Pain?  No/denies    Multiple Pain Sites  No       Capillary Blood Glucose: No results found for this or any previous visit (from the past 24 hour(s)). POCT Glucose - 05/27/17 1605      POCT Blood Glucose   Pre-Exercise  230 mg/dL    Post-Exercise  206 mg/dL      Exercise Prescription Changes - 05/27/17 1600      Response to Exercise   Blood Pressure (Admit)  136/66    Blood Pressure (Exercise)  140/70    Blood Pressure (Exit)  120/60    Heart Rate (Admit)  102 bpm    Heart Rate (Exercise)  105 bpm    Heart Rate (Exit)  96 bpm    Oxygen Saturation (Admit)  92 %    Oxygen Saturation (Exercise)  95 %    Oxygen Saturation (Exit)  96 %    Rating of Perceived Exertion (Exercise)  13    Perceived Dyspnea (Exercise)  3    Duration  Progress to 45 minutes of aerobic exercise without signs/symptoms of physical distress    Intensity  THRR unchanged      Progression   Progression  Continue to progress workloads to maintain intensity without signs/symptoms of physical  distress.      Resistance Training   Training Prescription  Yes    Weight  orange bands    Reps  10-15    Time  10 Minutes      NuStep   Level  4    Minutes  34    METs  2       Social History   Tobacco Use  Smoking Status Never Smoker  Smokeless Tobacco Never Used    Goals Met:  Exercise tolerated well No report of cardiac concerns or symptoms Strength training completed today  Goals Unmet:  Not Applicable  Comments: Service time is from 1330 to 1540      Dr. Rush Farmer is Medical Director for Pulmonary Rehab at [facility].

## 2017-05-28 NOTE — Progress Notes (Signed)
I have reviewed a Home Exercise Prescription with Kathleen Hatfield . Kathleen Hatfield is not currently exercising at home.  The patient was advised to walk 2 days a week for 30 minutes.  Kathleen Hatfield and I discussed how to progress their exercise prescription.  The patient stated that their goals were to increase quality of life, be able to go on more trips, and be able to walk at the beach.  The patient stated that they understand the exercise prescription.  We reviewed exercise guidelines, target heart rate during exercise, oxygen use, weather, home pulse oximeter, endpoints for exercise, and goals.  Patient is encouraged to come to me with any questions. I will continue to follow up with the patient to assist them with progression and safety.

## 2017-06-01 ENCOUNTER — Encounter (HOSPITAL_COMMUNITY)
Admission: RE | Admit: 2017-06-01 | Discharge: 2017-06-01 | Disposition: A | Discharge status: Home / Self Care | Payer: Medicare HMO | Source: Ambulatory Visit | Attending: Pulmonary Disease | Admitting: Pulmonary Disease

## 2017-06-01 DIAGNOSIS — J449 Chronic obstructive pulmonary disease, unspecified: Secondary | ICD-10-CM

## 2017-06-01 DIAGNOSIS — J45902 Unspecified asthma with status asthmaticus: Secondary | ICD-10-CM

## 2017-06-01 NOTE — Progress Notes (Signed)
Daily Session Note  Patient Details  Name: Kathleen Hatfield MRN: [id number] Date of Birth: 07-16-1955 Referring Provider:     Pulmonary Rehab Walk Test from 04/01/2017 in [place]  Referring Provider  Dr. Halford Chessman      Encounter Date: 06/01/2017  Check In: Session Check In - 06/01/17 1324      Check-In   Location  [facility]    Staff Present  Su Hilt, MS, ACSM RCEP, Exercise Physiologist;Portia Rollene Rotunda, RN, Roque Cash, RN    Supervising physician immediately available to respond to emergencies  [facility] immediately available    Physician(s)  Dr. Lonny Prude    Medication changes reported      No    Fall or balance concerns reported     No    Tobacco Cessation  No Change    Warm-up and Cool-down  Performed as group-led instruction    Resistance Training Performed  Yes    VAD Patient?  No      Pain Assessment   Currently in Pain?  No/denies    Multiple Pain Sites  No       Capillary Blood Glucose: No results found for this or any previous visit (from the past 24 hour(s)).    Social History   Tobacco Use  Smoking Status Never Smoker  Smokeless Tobacco Never Used    Goals Met:  Exercise tolerated well No report of cardiac concerns or symptoms Strength training completed today  Goals Unmet:  Not Applicable  Comments: Service time is from 1330 to 1515    Dr. Rush Farmer is Medical Director for Pulmonary Rehab at [facility].

## 2017-06-03 ENCOUNTER — Encounter (HOSPITAL_COMMUNITY)
Admission: RE | Admit: 2017-06-03 | Discharge: 2017-06-03 | Disposition: A | Discharge status: Home / Self Care | Payer: Medicare HMO | Source: Ambulatory Visit | Attending: Pulmonary Disease | Admitting: Pulmonary Disease

## 2017-06-03 VITALS — Wt 273.8 lb

## 2017-06-03 DIAGNOSIS — J45902 Unspecified asthma with status asthmaticus: Secondary | ICD-10-CM

## 2017-06-03 DIAGNOSIS — J449 Chronic obstructive pulmonary disease, unspecified: Secondary | ICD-10-CM | POA: Diagnosis not present

## 2017-06-03 NOTE — Progress Notes (Signed)
Daily Session Note  Patient Details  Name: Kathleen Hatfield MRN: [id number] Date of Birth: 1955/12/11 Referring Provider:     Pulmonary Rehab Walk Test from 04/01/2017 in [place]  Referring Provider  Dr. Halford Chessman      Encounter Date: 06/03/2017  Check In: Session Check In - 06/03/17 1330      Check-In   Location  [facility]    Staff Present  Su Hilt, MS, ACSM RCEP, Exercise Physiologist;Portia Rollene Rotunda, RN, Roque Cash, RN    Supervising physician immediately available to respond to emergencies  [facility] immediately available    Physician(s)  Dr. Eliseo Squires    Medication changes reported      No    Fall or balance concerns reported     No    Tobacco Cessation  No Change    Warm-up and Cool-down  Performed as group-led instruction    Resistance Training Performed  Yes    VAD Patient?  No      Pain Assessment   Currently in Pain?  No/denies    Multiple Pain Sites  No       Capillary Blood Glucose: No results found for this or any previous visit (from the past 24 hour(s)).    Social History   Tobacco Use  Smoking Status Never Smoker  Smokeless Tobacco Never Used    Goals Met:  Exercise tolerated well No report of cardiac concerns or symptoms Strength training completed today  Goals Unmet:  Not Applicable  Comments: Service time is from 1330 to 1520    Dr. Rush Farmer is Medical Director for Pulmonary Rehab at [facility].

## 2017-06-08 ENCOUNTER — Encounter (HOSPITAL_COMMUNITY): Payer: Self-pay

## 2017-06-08 ENCOUNTER — Encounter (HOSPITAL_COMMUNITY): Payer: Medicare HMO

## 2017-06-08 DIAGNOSIS — G4733 Obstructive sleep apnea (adult) (pediatric): Secondary | ICD-10-CM | POA: Diagnosis not present

## 2017-06-08 DIAGNOSIS — B029 Zoster without complications: Secondary | ICD-10-CM | POA: Diagnosis not present

## 2017-06-10 ENCOUNTER — Encounter (HOSPITAL_COMMUNITY): Payer: Medicare HMO

## 2017-06-14 NOTE — Progress Notes (Signed)
Pulmonary Individual Treatment Plan  Patient Details  Name: Kathleen Hatfield MRN: [id number] Date of Birth: 1955/11/23 Referring Provider:     Pulmonary Rehab Walk Test from 04/01/2017 in [place]  Referring Provider  Dr. Halford Chessman      Initial Encounter Date:    Pulmonary Rehab Walk Test from 04/01/2017 in [place]  Date  04/01/17  Referring Provider  Dr. 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  albuterol (PROAIR HFA) 108 (90 Base) MCG/ACT inhaler, INHALE 2 PUFFS INTO THE LUNGS EVERY FOUR HOURS AS NEEDED FOR WHEEZING., Disp: 8.5 g, Rfl: 5 .  BAYER CONTOUR NEXT TEST test strip, As directed, Disp: , Rfl:  .  budesonide-formoterol (SYMBICORT) 160-4.5 MCG/ACT inhaler, Inhale 2 puffs into the lungs every 12 (twelve) hours., Disp: 1 Inhaler, Rfl: 0 .  budesonide-formoterol (SYMBICORT) 160-4.5 MCG/ACT inhaler, Inhale 2 puffs into the lungs 2 (two) times daily., Disp: 1 Inhaler, Rfl: 5 .  Cholecalciferol (VITAMIN D-3) 5000 UNITS TABS, Take 5,000 Units by mouth daily. , Disp: , Rfl:  .  DULoxetine (CYMBALTA) 60 MG capsule, Take 60 mg by mouth 2 (two) times daily. , Disp: , Rfl:  .  empagliflozin (JARDIANCE) 25 MG TABS tablet, Take 25 mg by mouth daily., Disp: , Rfl:  .  estradiol (ESTRACE) 2 MG tablet, Take 1 tablet (2 mg total) by mouth daily., Disp: 90 tablet, Rfl: 3 .  fenofibrate (TRICOR) 145 MG tablet, Take 145 mg by mouth at bedtime. , Disp: , Rfl: 11 .  folic acid (FOLVITE) 1 MG tablet, Take 1 mg by mouth 2 (two) times daily., Disp: , Rfl:  .  furosemide (LASIX) 40 MG tablet, Take 40 mg by mouth daily., Disp: , Rfl:  .  gabapentin (NEURONTIN) 100 MG capsule, Take 200-400 mg by mouth See admin instructions. Take 2 capsules every morning and take 4 capsules at bedtime,  Disp: , Rfl:  .  GLOBAL EASE INJECT PEN NEEDLES 32G X 4 MM MISC, , Disp: , Rfl:  .  guaiFENesin-dextromethorphan (ROBITUSSIN DM) 100-10 MG/5ML syrup, Take 5 mLs by mouth every 6 (six) hours as needed for cough., Disp: 118 mL, Rfl: 0 .  Liraglutide (VICTOZA) 18 MG/3ML SOPN, Inject 1.2 mg into the skin daily with lunch. , Disp: , Rfl:  .  MICROLET LANCETS MISC, As directed, Disp: , Rfl:  .  pantoprazole (PROTONIX) 40 MG tablet, Take 40 mg by mouth daily. , Disp: , Rfl:  .  rOPINIRole (REQUIP) 4 MG tablet, Take 4 mg by mouth 2 (two) times daily. , Disp: , Rfl:  .  spironolactone (ALDACTONE) 25 MG tablet, Take 25 mg by mouth daily., Disp: , Rfl:  .  TOUJEO SOLOSTAR 300 UNIT/ML SOPN, Inject 70 Units into the skin daily with lunch. , Disp: , Rfl:  .  UNABLE TO FIND, CPAP at bedtime, Disp: , Rfl:   Past Medical History: Past Medical History:  Diagnosis Date  . Anemia   . Arthritis   . Asthma   . CHF (congestive heart failure) ([place]) 05/2014  . Chronic diastolic heart failure ([place])   . CKD (chronic kidney disease)   . Depression   . Diabetes mellitus without complication ([place])   . Diverticulitis   . Dyspnea    with exertion  . GERD (gastroesophageal reflux disease)   .  History of blood transfusion   . Hyperlipidemia    cannot tolerate statins  . Hypertension   . Peripheral neuropathy   . Pneumonia   . PONV (postoperative nausea and vomiting)   . Sleep apnea    uses CPAP  . Ulcerative colitis ([place])    dr Collene Mares    Tobacco Use: Social History   Tobacco Use  Smoking Status Never Smoker  Smokeless Tobacco Never Used    Labs: Recent Review Flowsheet Data    Labs for ITP Cardiac and Pulmonary Rehab Latest Ref Rng & Units 10/21/2015 10/22/2015 04/02/2016 04/03/2016 05/04/2017   Cholestrol 0 - 200 mg/dL - - - - -   LDLCALC 0 - 99 mg/dL - - - - -   HDL >39 mg/dL - - - - -   Trlycerides <150 mg/dL - - - - -   Hemoglobin A1c 4.8 - 5.6 % 7.4(H) - 7.6(H) - 8.5(H)   PHART 7.350 - 7.450 -  7.438 - 7.456(H) -   PCO2ART 32.0 - 48.0 mmHg - 41.0 - 45.2 -   HCO3 20.0 - 28.0 mmol/L - 27.3(H) - 31.4(H) -   TCO2 0 - 100 mmol/L - 25.1 - - -   O2SAT % - 95.7 - 90.6 -      Capillary Blood Glucose: Lab Results  Component Value Date   GLUCAP 204 (H) 05/06/2017   GLUCAP 266 (H) 05/06/2017   GLUCAP 221 (H) 05/05/2017   GLUCAP 211 (H) 05/05/2017   GLUCAP 291 (H) 05/05/2017   POCT Glucose    Row Name 04/13/17 1531 04/15/17 1610 05/27/17 1605         POCT Blood Glucose   Pre-Exercise  216 mg/dL  155 mg/dL  230 mg/dL     Post-Exercise  173 mg/dL  125 mg/dL  206 mg/dL        Pulmonary Assessment Scores: Pulmonary Assessment Scores    Row Name 04/02/17 0710         ADL UCSD   ADL Phase  Entry       mMRC Score   mMRC Score  4        Pulmonary Function Assessment:   Exercise Target Goals:    Exercise Program Goal: Individual exercise prescription set using results from initial 6 min walk test and THRR while considering  patient's activity barriers and safety.    Exercise Prescription Goal: Initial exercise prescription builds to 30-45 minutes a day of aerobic activity, 2-3 days per week.  Home exercise guidelines will be given to patient during program as part of exercise prescription that the participant will acknowledge.  Activity Barriers & Risk Stratification:   6 Minute Walk: 6 Minute Walk    Row Name 04/02/17 0711         6 Minute Walk   Phase  Initial     Distance  500 feet     Walk Time  - 5 minutes 22 seconds     # of Rest Breaks  - 1 standing break lasting 38 seconds     MPH  1     METS  1.7     RPE  14     Perceived Dyspnea   4     Symptoms  Yes (comment)     Comments  Used rollator-back pain 8/10      Resting HR  100 bpm     Resting BP  148/91     Resting Oxygen Saturation   93 %  Exercise Oxygen Saturation  during 6 min walk  92 %     Max Ex. HR  115 bpm     Max Ex. BP  144/82       Interval HR   1 Minute HR  110     2 Minute  HR  116     3 Minute HR  116     4 Minute HR  115     5 Minute HR  114     6 Minute HR  114     2 Minute Post HR  105     Interval Heart Rate?  Yes       Interval Oxygen   Interval Oxygen?  Yes     Baseline Oxygen Saturation %  93 %     1 Minute Oxygen Saturation %  92 %     1 Minute Liters of Oxygen  0 L     2 Minute Oxygen Saturation %  93 %     2 Minute Liters of Oxygen  0 L     3 Minute Oxygen Saturation %  93 %     3 Minute Liters of Oxygen  0 L     4 Minute Oxygen Saturation %  93 %     4 Minute Liters of Oxygen  0 L     5 Minute Oxygen Saturation %  94 %     5 Minute Liters of Oxygen  0 L     6 Minute Oxygen Saturation %  94 %     6 Minute Liters of Oxygen  0 L     2 Minute Post Oxygen Saturation %  96 %     2 Minute Post Liters of Oxygen  0 L        Oxygen Initial Assessment: Oxygen Initial Assessment - 04/02/17 0708      Initial 6 min Walk   Oxygen Used  None      Program Oxygen Prescription   Program Oxygen Prescription  None       Oxygen Re-Evaluation: Oxygen Re-Evaluation    Row Name 04/16/17 1150 05/17/17 0904 06/11/17 1517         Program Oxygen Prescription   Program Oxygen Prescription  None  None  None       Home Oxygen   Home Oxygen Device  None  None  None     Sleep Oxygen Prescription  CPAP  CPAP  CPAP     Home Exercise Oxygen Prescription  None  None  None     Home at Rest Exercise Oxygen Prescription  None  None  None     Compliance with Home Oxygen Use  Yes  Yes  Yes       Goals/Expected Outcomes   Goals/Expected Outcomes  -  -  maintain compliance with CPAP        Oxygen Discharge (Final Oxygen Re-Evaluation): Oxygen Re-Evaluation - 06/11/17 1517      Program Oxygen Prescription   Program Oxygen Prescription  None      Home Oxygen   Home Oxygen Device  None    Sleep Oxygen Prescription  CPAP    Home Exercise Oxygen Prescription  None    Home at Rest Exercise Oxygen Prescription  None    Compliance with Home Oxygen Use  Yes       Goals/Expected Outcomes   Goals/Expected Outcomes  maintain compliance with CPAP       Initial  Exercise Prescription: Initial Exercise Prescription - 04/02/17 0700      Date of Initial Exercise RX and Referring Provider   Date  04/01/17    Referring Provider  Dr. Halford Chessman      NuStep   Level  1    SPM  70    Minutes  17    METs  1.5      Track   Laps  3    Minutes  17      Prescription Details   Frequency (times per week)  2    Duration  Progress to 45 minutes of aerobic exercise without signs/symptoms of physical distress      Intensity   THRR 40-80% of Max Heartrate  63-126    Ratings of Perceived Exertion  11-13    Perceived Dyspnea  0-4      Progression   Progression  Continue progressive overload as per policy without signs/symptoms or physical distress.      Resistance Training   Training Prescription  Yes    Weight  orange bands    Reps  10-15       Perform Capillary Blood Glucose checks as needed.  Exercise Prescription Changes: Exercise Prescription Changes    Row Name 04/13/17 1531 04/20/17 1541 05/27/17 1600 06/03/17 1641 06/08/17 1623     Response to Exercise   Blood Pressure (Admit)  126/64  120/56  136/66  124/76  -   Blood Pressure (Exercise)  130/62  134/62  140/70  150/60  -   Blood Pressure (Exit)  140/68  108/68  120/60  110/60  -   Heart Rate (Admit)  90 bpm  90 bpm  102 bpm  89 bpm  -   Heart Rate (Exercise)  102 bpm  101 bpm  105 bpm  107 bpm  -   Heart Rate (Exit)  98 bpm  99 bpm  96 bpm  95 bpm  -   Oxygen Saturation (Admit)  93 %  94 %  92 %  93 %  -   Oxygen Saturation (Exercise)  94 %  92 %  95 %  95 %  -   Oxygen Saturation (Exit)  95 %  94 %  96 %  95 %  -   Rating of Perceived Exertion (Exercise)  13  17  13  13   -   Perceived Dyspnea (Exercise)  2  3  3  3   -   Duration  Progress to 45 minutes of aerobic exercise without signs/symptoms of physical distress  Progress to 45 minutes of aerobic exercise without signs/symptoms of  physical distress  Progress to 45 minutes of aerobic exercise without signs/symptoms of physical distress  Progress to 45 minutes of aerobic exercise without signs/symptoms of physical distress  -   Intensity  THRR unchanged  THRR unchanged  THRR unchanged  THRR unchanged  -     Progression   Progression  Continue to progress workloads to maintain intensity without signs/symptoms of physical distress.  Continue to progress workloads to maintain intensity without signs/symptoms of physical distress.  Continue to progress workloads to maintain intensity without signs/symptoms of physical distress.  Continue to progress workloads to maintain intensity without signs/symptoms of physical distress.  -     Resistance Training   Training Prescription  Yes  Yes  Yes  Yes  -   Weight  orange bands  orange bands  orange bands  orange bands  -   Reps  10-15  10-15  10-15  10-15  -   Time  -  10 Minutes  10 Minutes  10 Minutes  -     Oxygen   Oxygen  - room air  -  -  -  -     NuStep   Level  1  2  4  5   -   SPM  70  70  -  80  -   Minutes  51  34  34  34  -   METs  1.8  1.9  2  2.2  -     Track   Laps  3  4  -  -  -   Minutes  17  17  -  -  -     Home Exercise Plan   Plans to continue exercise at  -  -  Home (comment)  -  -   Frequency  -  -  Add 2 additional days to program exercise sessions.  -  -      Exercise Comments: Exercise Comments    Row Name 05/28/17 0704           Exercise Comments  Home exercise completed          Exercise Goals and Review:   Exercise Goals Re-Evaluation : Exercise Goals Re-Evaluation    Row Name 04/16/17 1151 05/17/17 0904 06/11/17 1517         Exercise Goal Re-Evaluation   Exercise Goals Review  Increase Strength and Stamina;Able to understand and use Dyspnea scale;Able to understand and use rate of perceived exertion (RPE) scale;Increase Physical Activity;Knowledge and understanding of Target Heart Rate Range (THRR);Understanding of Exercise  Prescription  Increase Strength and Stamina;Able to understand and use Dyspnea scale;Able to understand and use rate of perceived exertion (RPE) scale;Increase Physical Activity;Knowledge and understanding of Target Heart Rate Range (THRR);Understanding of Exercise Prescription  Increase Strength and Stamina;Able to understand and use Dyspnea scale;Able to understand and use rate of perceived exertion (RPE) scale;Increase Physical Activity;Knowledge and understanding of Target Heart Rate Range (THRR);Understanding of Exercise Prescription     Comments  Patient has only attended three rehab sessions. Will cont. to educate and encourage.   Patient has only attended 4 rehab sessions. Has been out with flu since 04/20/17. Will cont to monitor and progress as tolerated once she returns to rehab.   The patient has been motivated to make changes. However, now she is out of rehab due to the shingles. Patient is able to walk up to 9 laps (200 ft) in 15 minutes. Will cont. to monitor and progress as able.     Expected Outcomes  Through exercise at rehab and at home, patient will increase strength and stamina making ADL's easier to perform. Patient will also have a better understanding of safe exercise and what they are capable to do outside of clinical supervision.  Through exercise at rehab and at home, patient will increase strength and stamina making ADL's easier to perform. Patient will also have a better understanding of safe exercise and what they are capable to do outside of clinical supervision.  Through exercise at rehab and at home, patient will increase strength and stamina making ADL's easier to perform. Patient will also have a better understanding of safe exercise and what they are capable to do outside of clinical supervision.        Discharge Exercise Prescription (Final Exercise Prescription Changes): Exercise Prescription Changes - 06/08/17 1623  Response to Exercise   Blood Pressure (Admit)  --     Blood Pressure (Exercise)  --    Blood Pressure (Exit)  --    Heart Rate (Admit)  --    Heart Rate (Exercise)  --    Heart Rate (Exit)  --    Oxygen Saturation (Admit)  --    Oxygen Saturation (Exercise)  --    Oxygen Saturation (Exit)  --    Rating of Perceived Exertion (Exercise)  --    Perceived Dyspnea (Exercise)  --    Duration  --    Intensity  --      Progression   Progression  --      Resistance Training   Training Prescription  --    Weight  --    Reps  --    Time  --      NuStep   Level  --    Minutes  --    METs  --      Home Exercise Plan   Plans to continue exercise at  --    Frequency  --       Nutrition:  Target Goals: Understanding of nutrition guidelines, daily intake of sodium <1573m, cholesterol <2027m calories 30% from fat and 7% or less from saturated fats, daily to have 5 or more servings of fruits and vegetables.  Biometrics: Pre Biometrics - 06/08/17 1600      Pre Biometrics   Weight  --    BMI (Calculated)  --        Nutrition Therapy Plan and Nutrition Goals: Nutrition Therapy & Goals - 04/15/17 1524      Nutrition Therapy   Diet  Carb Modified, Heart Healthy      Personal Nutrition Goals   Nutrition Goal  Identify food quantities necessary to achieve wt loss of  -2# per week to a goal wt loss of 6-24 lb at graduation from pulmonary rehab.    Personal Goal #2  CBG's in the normal range or as close to normal as is safely possible.      Intervention Plan   Intervention  Prescribe, educate and counsel regarding individualized specific dietary modifications aiming towards targeted core components such as weight, hypertension, lipid management, diabetes, heart failure and other comorbidities.    Expected Outcomes  Short Term Goal: Understand basic principles of dietary content, such as calories, fat, sodium, cholesterol and nutrients.;Long Term Goal: Adherence to prescribed nutrition plan.       Nutrition  Assessments: Nutrition Assessments - 04/15/17 1524      Rate Your Plate Scores   Pre Score  53       Nutrition Goals Re-Evaluation:   Nutrition Goals Discharge (Final Nutrition Goals Re-Evaluation):   Psychosocial: Target Goals: Acknowledge presence or absence of significant depression and/or stress, maximize coping skills, provide positive support system. Participant is able to verbalize types and ability to use techniques and skills needed for reducing stress and depression.  Initial Review & Psychosocial Screening: Initial Psych Review & Screening - 03/29/17 1308      Initial Review   Current issues with  Current Depression;History of Depression;Current Psychotropic Meds;Current Sleep Concerns      Family Dynamics   Good Support System?  Yes      Barriers   Psychosocial barriers to participate in program  Psychosocial barriers identified (see note) her illnesses are her source of stress      Screening Interventions   Interventions  Encouraged to  exercise    Expected Outcomes  Short Term goal: Utilizing psychosocial counselor, staff and physician to assist with identification of specific Stressors or current issues interfering with healing process. Setting desired goal for each stressor or current issue identified.;Long Term Goal: Stressors or current issues are controlled or eliminated.;Short Term goal: Identification and review with participant of any Quality of Life or Depression concerns found by scoring the questionnaire.;Long Term goal: The participant improves quality of Life and PHQ9 Scores as seen by post scores and/or verbalization of changes       Quality of Life Scores:  Scores of 19 and below usually indicate a poorer quality of life in these areas.  A difference of  2-3 points is a clinically meaningful difference.  A difference of 2-3 points in the total score of the Quality of Life Index has been associated with significant improvement in overall quality of  life, self-image, physical symptoms, and general health in studies assessing change in quality of life.   PHQ-9: Recent Review Flowsheet Data    Depression screen [facility] 2/9 03/29/2017   Decreased Interest 3   Down, Depressed, Hopeless 2   PHQ - 2 Score 5   Altered sleeping 3   Tired, decreased energy 3   Change in appetite 1   Feeling bad or failure about yourself  3   Trouble concentrating 3   Moving slowly or fidgety/restless 2   Suicidal thoughts 0   PHQ-9 Score 20   Difficult doing work/chores Extremely dIfficult     Interpretation of Total Score  Total Score Depression Severity:  1-4 = Minimal depression, 5-9 = Mild depression, 10-14 = Moderate depression, 15-19 = Moderately severe depression, 20-27 = Severe depression   Psychosocial Evaluation and Intervention: Psychosocial Evaluation - 03/29/17 1310      Psychosocial Evaluation & Interventions   Interventions  Encouraged to exercise with the program and follow exercise prescription    Comments  Is taking cymbalta and this helps with depression, cannot afford a psychologist and sees her PCP for the depression    Continue Psychosocial Services   Follow up required by staff       Psychosocial Re-Evaluation: Psychosocial Re-Evaluation    [place] Name 04/19/17 6803 05/10/17 1207 06/14/17 1630         Psychosocial Re-Evaluation   Current issues with  Current Depression;History of Depression  Current Depression;History of Depression  Current Depression;History of Depression     Comments  No change re: depression since orientation  No change re: depression since orientation.  Is on medication for depression and is stable at this time.  No change re: depression since orientation.  Is on medication for depression and is stable at this time.     Expected Outcomes  no barriers to participation in pulmonary rehab  no barriers to participation in pulmonary rehab  patient will remain free from psychosocial barriers to participation in  pulmonary rehab program     Interventions  Encouraged to attend Pulmonary Rehabilitation for the exercise  Encouraged to attend Pulmonary Rehabilitation for the exercise  Encouraged to attend Pulmonary Rehabilitation for the exercise     Continue Psychosocial Services   Follow up required by staff  Follow up required by staff  Follow up required by staff        Psychosocial Discharge (Final Psychosocial Re-Evaluation): Psychosocial Re-Evaluation - 06/14/17 1630      Psychosocial Re-Evaluation   Current issues with  Current Depression;History of Depression    Comments  No change re: depression since orientation.  Is on medication for depression and is stable at this time.    Expected Outcomes  patient will remain free from psychosocial barriers to participation in pulmonary rehab program    Interventions  Encouraged to attend Pulmonary Rehabilitation for the exercise    Continue Psychosocial Services   Follow up required by staff       Education: Education Goals: Education classes will be provided on a weekly basis, covering required topics. Participant will state understanding/return demonstration of topics presented.  Learning Barriers/Preferences: Learning Barriers/Preferences - 03/29/17 1301      Learning Barriers/Preferences   Learning Preferences  Computer/Internet;Written Material;Video;Verbal Instruction;Skilled Demonstration;Pictoral;Individual Instruction       Education Topics: Risk Factor Reduction:  -Group instruction that is supported by a PowerPoint presentation. Instructor discusses the definition of a risk factor, different risk factors for pulmonary disease, and how the heart and lungs work together.     Nutrition for Pulmonary Patient:  -Group instruction provided by PowerPoint slides, verbal discussion, and written materials to support subject matter. The instructor gives an explanation and review of healthy diet recommendations, which includes a discussion on  weight management, recommendations for fruit and vegetable consumption, as well as protein, fluid, caffeine, fiber, sodium, sugar, and alcohol. Tips for eating when patients are short of breath are discussed.   PULMONARY REHAB CHRONIC OBSTRUCTIVE PULMONARY DISEASE from 06/03/2017 in [place]  Date  05/27/17  Educator  edna  Instruction Review Code  2- Demonstrated Understanding      Pursed Lip Breathing:  -Group instruction that is supported by demonstration and informational handouts. Instructor discusses the benefits of pursed lip and diaphragmatic breathing and detailed demonstration on how to preform both.     Oxygen Safety:  -Group instruction provided by PowerPoint, verbal discussion, and written material to support subject matter. There is an overview of "What is Oxygen" and "Why do we need it".  Instructor also reviews how to create a safe environment for oxygen use, the importance of using oxygen as prescribed, and the risks of noncompliance. There is a brief discussion on traveling with oxygen and resources the patient may utilize.   PULMONARY REHAB CHRONIC OBSTRUCTIVE PULMONARY DISEASE from 06/03/2017 in [place]  Date  04/15/17  Educator  RN  Instruction Review Code  2- Demonstrated Understanding      Oxygen Equipment:  -Group instruction provided by [facility] Staff utilizing handouts, written materials, and equipment demonstrations.   Signs and Symptoms:  -Group instruction provided by written material and verbal discussion to support subject matter. Warning signs and symptoms of infection, stroke, and heart attack are reviewed and when to call the physician/911 reinforced. Tips for preventing the spread of infection discussed.   Advanced Directives:  -Group instruction provided by verbal instruction and written material to support subject matter. Instructor reviews Advanced Directive laws and proper  instruction for filling out document.   Pulmonary Video:  -Group video education that reviews the importance of medication and oxygen compliance, exercise, good nutrition, pulmonary hygiene, and pursed lip and diaphragmatic breathing for the pulmonary patient.   Exercise for the Pulmonary Patient:  -Group instruction that is supported by a PowerPoint presentation. Instructor discusses benefits of exercise, core components of exercise, frequency, duration, and intensity of an exercise routine, importance of utilizing pulse oximetry during exercise, safety while exercising, and options of places to exercise outside of rehab.     Pulmonary Medications:  -  Verbally interactive group education provided by instructor with focus on inhaled medications and proper administration.   PULMONARY REHAB CHRONIC OBSTRUCTIVE PULMONARY DISEASE from 06/03/2017 in [place]  Date  06/03/17  Educator  Pharmacist  Instruction Review Code  1- Verbalizes Understanding      Anatomy and Physiology of the Respiratory System and Intimacy:  -Group instruction provided by PowerPoint, verbal discussion, and written material to support subject matter. Instructor reviews respiratory cycle and anatomical components of the respiratory system and their functions. Instructor also reviews differences in obstructive and restrictive respiratory diseases with examples of each. Intimacy, Sex, and Sexuality differences are reviewed with a discussion on how relationships can change when diagnosed with pulmonary disease. Common sexual concerns are reviewed.   MD DAY -A group question and answer session with a medical doctor that allows participants to ask questions that relate to their pulmonary disease state.   OTHER EDUCATION -Group or individual verbal, written, or video instructions that support the educational goals of the pulmonary rehab program.   Holiday Eating Survival Tips:  -Group  instruction provided by PowerPoint slides, verbal discussion, and written materials to support subject matter. The instructor gives patients tips, tricks, and techniques to help them not only survive but enjoy the holidays despite the onslaught of food that accompanies the holidays.   Knowledge Questionnaire Score:   Core Components/Risk Factors/Patient Goals at Admission: Personal Goals and Risk Factors at Admission - 03/29/17 1306      Core Components/Risk Factors/Patient Goals on Admission    Weight Management  Obesity;Weight Loss    Improve shortness of breath with ADL's  Yes    Intervention  Provide education, individualized exercise plan and daily activity instruction to help decrease symptoms of SOB with activities of daily living.    Expected Outcomes  Short Term: Improve cardiorespiratory fitness to achieve a reduction of symptoms when performing ADLs;Long Term: Be able to perform more ADLs without symptoms or delay the onset of symptoms    Heart Failure  Yes    Intervention  Provide a combined exercise and nutrition program that is supplemented with education, support and counseling about heart failure. Directed toward relieving symptoms such as shortness of breath, decreased exercise tolerance, and extremity edema.    Expected Outcomes  Improve functional capacity of life;Short term: Attendance in program 2-3 days a week with increased exercise capacity. Reported lower sodium intake. Reported increased fruit and vegetable intake. Reports medication compliance.;Short term: Daily weights obtained and reported for increase. Utilizing diuretic protocols set by physician.;Long term: Adoption of self-care skills and reduction of barriers for early signs and symptoms recognition and intervention leading to self-care maintenance.    Stress  Yes    Intervention  Offer individual and/or small group education and counseling on adjustment to heart disease, stress management and health-related  lifestyle change. Teach and support self-help strategies.;Refer participants experiencing significant psychosocial distress to appropriate mental health specialists for further evaluation and treatment. When possible, include family members and significant others in education/counseling sessions.    Expected Outcomes  Short Term: Participant demonstrates changes in health-related behavior, relaxation and other stress management skills, ability to obtain effective social support, and compliance with psychotropic medications if prescribed.;Long Term: Emotional wellbeing is indicated by absence of clinically significant psychosocial distress or social isolation.       Core Components/Risk Factors/Patient Goals Review:  Goals and Risk Factor Review    Row Name 04/19/17 [phone] 05/10/17 1204 06/14/17 1622  Core Components/Risk Factors/Patient Goals Review   Personal Goals Review  Weight Management/Obesity;Improve shortness of breath with ADL's;Develop more efficient breathing techniques such as purse lipped breathing and diaphragmatic breathing and practicing self-pacing with activity.;Stress  Weight Management/Obesity;Improve shortness of breath with ADL's;Develop more efficient breathing techniques such as purse lipped breathing and diaphragmatic breathing and practicing self-pacing with activity.;Stress  Weight Management/Obesity;Improve shortness of breath with ADL's;Develop more efficient breathing techniques such as purse lipped breathing and diaphragmatic breathing and practicing self-pacing with activity.;Stress     Review  Just started program, has attended 3 exercise sessions, multiple health issues, chronic pain in her hips and back have limited her from walking on the track.  In hopes she can do the program to graduation, but afraid her chronic pain will drastically slow her progress.  has attended 4 exercise sessions, was hospitalized last week with influenza, will be out this week to get  her strength back and will return next week.  Due to illness unable to meet admission goals.  patient continues to have medical issues that interfere with attendance to pulmonary rehab. as of last week, patient was diagnosed with shingles. she will not return this week and we will readdress on Monday of next. she has not lost any weight however she has not changed her eating habits related to other medical issues that has taken precidences. she works hard when she is in attendance and has even self increased workloads on equiptment. as of now she has not shown much progression towards pulmonary goals. expect to see better progress towards pulmonary rehab goals over the next 30 days if she is able to have consistant attendance.     Expected Outcomes  Slow progression d/t chronic pain issues.  Slow progression d/t chronic pain issues and recent hospitalization.  see "admission" expected outcomes        Core Components/Risk Factors/Patient Goals at Discharge (Final Review):  Goals and Risk Factor Review - 06/14/17 1622      Core Components/Risk Factors/Patient Goals Review   Personal Goals Review  Weight Management/Obesity;Improve shortness of breath with ADL's;Develop more efficient breathing techniques such as purse lipped breathing and diaphragmatic breathing and practicing self-pacing with activity.;Stress    Review  patient continues to have medical issues that interfere with attendance to pulmonary rehab. as of last week, patient was diagnosed with shingles. she will not return this week and we will readdress on Monday of next. she has not lost any weight however she has not changed her eating habits related to other medical issues that has taken precidences. she works hard when she is in attendance and has even self increased workloads on equiptment. as of now she has not shown much progression towards pulmonary goals. expect to see better progress towards pulmonary rehab goals over the next 30 days if  she is able to have consistant attendance.    Expected Outcomes  see "admission" expected outcomes       ITP Comments:   Comments: patient has only attended 10 exercise session since her admission on 03/29/17 to the pulmonary rehab program.

## 2017-06-15 ENCOUNTER — Encounter (HOSPITAL_COMMUNITY)
Admission: RE | Admit: 2017-06-15 | Discharge: 2017-06-15 | Disposition: A | Discharge status: Home / Self Care | Payer: Medicare HMO | Source: Ambulatory Visit | Attending: Pulmonary Disease | Admitting: Pulmonary Disease

## 2017-06-15 DIAGNOSIS — J45902 Unspecified asthma with status asthmaticus: Secondary | ICD-10-CM

## 2017-06-15 DIAGNOSIS — J449 Chronic obstructive pulmonary disease, unspecified: Secondary | ICD-10-CM | POA: Insufficient documentation

## 2017-06-17 ENCOUNTER — Encounter (HOSPITAL_COMMUNITY): Payer: Medicare HMO

## 2017-06-22 ENCOUNTER — Encounter (HOSPITAL_COMMUNITY): Payer: Medicare HMO

## 2017-06-22 NOTE — Progress Notes (Signed)
Daily Session Note  Patient Details  Name: ZENDAYAH HARDGRAVE MRN: [id number] Date of Birth: [birth year] Referring Provider:     Pulmonary Rehab Walk Test from 04/01/2017 in [place]  Referring Provider  Dr. Halford Chessman      Encounter Date: 06/03/2017  Check In:   Capillary Blood Glucose: No results found for this or any previous visit (from the past 24 hour(s)).    Social History   Tobacco Use  Smoking Status Never Smoker  Smokeless Tobacco Never Used    Goals Met:  Exercise tolerated well No report of cardiac concerns or symptoms Strength training completed today  Goals Unmet:  Not Applicable  Comments: Service time is from 1:30p to 3:00p    Dr. Rush Farmer is Medical Director for Pulmonary Rehab at [facility].

## 2017-06-24 ENCOUNTER — Encounter (HOSPITAL_COMMUNITY): Payer: Medicare HMO

## 2017-06-24 ENCOUNTER — Telehealth (HOSPITAL_COMMUNITY): Payer: Self-pay | Admitting: Family Medicine

## 2017-06-29 ENCOUNTER — Telehealth (HOSPITAL_COMMUNITY): Payer: Self-pay

## 2017-06-29 ENCOUNTER — Encounter (HOSPITAL_COMMUNITY): Admission: RE | Admit: 2017-06-29 | Payer: Medicare HMO | Source: Ambulatory Visit

## 2017-06-29 NOTE — Telephone Encounter (Signed)
Attempted to call patient in regards to lack of attendance to Pulmonary Rehab. Patient did not answer and I was not able to leave a message.

## 2017-07-01 ENCOUNTER — Encounter (HOSPITAL_COMMUNITY): Payer: Medicare HMO

## 2017-07-06 ENCOUNTER — Encounter (HOSPITAL_COMMUNITY): Payer: Medicare HMO

## 2017-07-06 NOTE — Telephone Encounter (Signed)
Patient has been a no call no show to Pulmonary Rehab on numerous occasions. Last attempts to contact she has not returned calls. Left message with patient to let her know we will be dropping her from the program.

## 2017-07-08 ENCOUNTER — Encounter (HOSPITAL_COMMUNITY): Payer: Medicare HMO

## 2017-07-09 DIAGNOSIS — G4733 Obstructive sleep apnea (adult) (pediatric): Secondary | ICD-10-CM | POA: Diagnosis not present

## 2017-07-13 ENCOUNTER — Encounter (HOSPITAL_COMMUNITY): Payer: Medicare HMO

## 2017-07-15 ENCOUNTER — Encounter (HOSPITAL_COMMUNITY): Payer: Medicare HMO

## 2017-07-20 ENCOUNTER — Encounter (HOSPITAL_COMMUNITY): Payer: Medicare HMO

## 2017-07-23 DIAGNOSIS — E1165 Type 2 diabetes mellitus with hyperglycemia: Secondary | ICD-10-CM | POA: Diagnosis not present

## 2017-07-23 DIAGNOSIS — E1129 Type 2 diabetes mellitus with other diabetic kidney complication: Secondary | ICD-10-CM | POA: Diagnosis not present

## 2017-07-23 DIAGNOSIS — Z6841 Body Mass Index (BMI) 40.0 and over, adult: Secondary | ICD-10-CM | POA: Diagnosis not present

## 2017-07-23 DIAGNOSIS — G2581 Restless legs syndrome: Secondary | ICD-10-CM | POA: Diagnosis not present

## 2017-07-23 DIAGNOSIS — I2781 Cor pulmonale (chronic): Secondary | ICD-10-CM | POA: Diagnosis not present

## 2017-07-30 ENCOUNTER — Encounter: Payer: Self-pay | Admitting: Pulmonary Disease

## 2017-07-30 ENCOUNTER — Ambulatory Visit: Payer: Medicare HMO | Admitting: Pulmonary Disease

## 2017-07-30 ENCOUNTER — Ambulatory Visit (INDEPENDENT_AMBULATORY_CARE_PROVIDER_SITE_OTHER)
Admission: RE | Admit: 2017-07-30 | Discharge: 2017-07-30 | Disposition: A | Discharge status: Home / Self Care | Payer: Medicare HMO | Source: Ambulatory Visit | Attending: Pulmonary Disease | Admitting: Pulmonary Disease

## 2017-07-30 VITALS — BP 118/80 | HR 87 | Ht 65.0 in | Wt 269.4 lb

## 2017-07-30 DIAGNOSIS — R0609 Other forms of dyspnea: Secondary | ICD-10-CM

## 2017-07-30 DIAGNOSIS — Z9989 Dependence on other enabling machines and devices: Secondary | ICD-10-CM

## 2017-07-30 DIAGNOSIS — J449 Chronic obstructive pulmonary disease, unspecified: Secondary | ICD-10-CM | POA: Diagnosis not present

## 2017-07-30 DIAGNOSIS — R0600 Dyspnea, unspecified: Secondary | ICD-10-CM

## 2017-07-30 DIAGNOSIS — G4733 Obstructive sleep apnea (adult) (pediatric): Secondary | ICD-10-CM | POA: Diagnosis not present

## 2017-07-30 DIAGNOSIS — R0602 Shortness of breath: Secondary | ICD-10-CM | POA: Diagnosis not present

## 2017-07-30 MED ORDER — ACLIDINIUM BROMIDE 400 MCG/ACT IN AEPB: 1.0000 | INHALATION_SPRAY | Freq: Two times a day (BID) | RESPIRATORY_TRACT | 5 refills | Status: DC

## 2017-07-30 NOTE — Patient Instructions (Addendum)
Tudorza two puffs twice per day Chest xray today Will arrange for CPAP mask refitting Refer back to pulmonary rehab  Follow up in 6 months

## 2017-07-30 NOTE — Progress Notes (Signed)
[facility]  Chief Complaint  Patient presents with  . Follow-up    [facility], would to restart pulmonary rehab. using albuterol inhaler once daily.     Vital signs: BP 118/80 (BP Location: Left Arm, Cuff Size: Normal)   Pulse 87   Ht 5' 5"  (1.651 m)   Wt 269 lb 6.4 oz (122.2 kg)   SpO2 97%   BMI 44.83 kg/m   History of Present Illness: Kathleen Hatfield is a 62 y.o. 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  She uses CPAP nightly.  No issues with mask fit.  She was seen in hospital in February for an exacerbation.  She still gets short of breath with activity.  Has intermittent cough.  No bringing up much sputum.  Denies hemoptysis, fever, chest pain, skin rash.  She wasn't able to get incruse.   Physical Exam:  General - pleasant Eyes - pupils reactive ENT - no sinus tenderness, no oral exudate, no LAN Cardiac - regular, no murmur Chest - no wheeze, rales Abd - soft, non tender Ext - no edema Skin - no rashes Neuro - normal strength Psych - normal mood   Assessment/Plan:  COPD with asthma. - add tudorza - continue symbicort - prn albuterol - refer back to pulmonary rehab  Obstructive sleep apnea. - she is compliant with CPAP and reports benefit - continue CPAP 13 cm H2O  Dyspnea on exertion. - most likely related to obesity and deconditioning at this point - she is still getting winded with activity - will check chest xray to make sure she doesn't have any additional causes of her dyspnea - will augment her inhaler regimen   Patient Instructions  Tudorza two puffs twice per day Chest xray today Will arrange for CPAP mask refitting Refer back to pulmonary rehab  Follow up in 6 months   [name], MD [place] 07/30/2017, 3:50 PM  Flow Sheet  Pulmonary tests: PFT 07/02/15 >> FEV1 1.50 (56%), FEV1% 69, TLC 4.99 (96%), DLCO 60% V/Q 04/05/16 >> normal  Sleep tests: PSG  09/01/12 >> AHI 36.1 CPAP 01/18/17 to 02/16/17 >> used on 30 of 30 nights with average 5 hrs 50 min.  Average AHI 0.3 with CPAP 13 cm H2O. ONO with CPAP 03/14/17 >> test time 10 hrs 34 min.  Basal SpO2 93%, low SpO2 88%.  Cardiac tests: Echo 04/03/16 >> mild LVH, EF 65 to 70%, grade 1 DD  Past Medical History: She  has a past medical history of Anemia, Arthritis, Asthma, CHF (congestive heart failure) ([place]) (05/2014), Chronic diastolic heart failure ([place]), CKD (chronic kidney disease), Depression, Diabetes mellitus without complication ([place]), Diverticulitis, Dyspnea, GERD (gastroesophageal reflux disease), History of blood transfusion, Hyperlipidemia, Hypertension, Peripheral neuropathy, Pneumonia, PONV (postoperative nausea and vomiting), Sleep apnea, and Ulcerative colitis ([place]).  Past Surgical History: She  has a past surgical history that includes Abdominal hysterectomy; Cholecystectomy; Cesarean section; Appendectomy; Sigmoidectomy (2010); right heart catheterization (N/A, 02/27/2013); right heart catheterization (N/A, 12/14/2013); Cardiac catheterization; Breast surgery; Hernia repair; Ventral hernia repair (N/A, 11/15/2015); Laparoscopic lysis of adhesions (N/A, 11/15/2015); and Insertion of mesh (N/A, 11/15/2015).  Family History: Her family history includes COPD in her father; Dementia in her mother; Heart disease in her father and mother; Hypertension in her father and mother.  Social History: She  reports that she has never smoked. She has never used smokeless tobacco. She reports that she does not drink alcohol or use drugs.  Medications:  Allergies as of 07/30/2017      Reactions   Almond Oil Anaphylaxis   Morphine And Related Shortness Of Breath, Other (See Comments)   Pt. States while in the hospital it affected her breathing, O2 dropped to the 70's   Ceclor [cefaclor] Nausea And Vomiting   Ciprofloxacin Other (See Comments)   Makes joints and muscles ache   Levaquin [levofloxacin]  Other (See Comments)   Body aches   Sulfa Antibiotics Nausea And Vomiting      Medication List        Accurate as of 07/30/17  3:50 PM. Always use your most recent med list.          albuterol 108 (90 Base) MCG/ACT inhaler Commonly known as:  PROAIR HFA INHALE 2 PUFFS INTO THE LUNGS EVERY FOUR HOURS AS NEEDED FOR WHEEZING.   BAYER CONTOUR NEXT TEST test strip Generic drug:  glucose blood As directed   budesonide-formoterol 160-4.5 MCG/ACT inhaler Commonly known as:  SYMBICORT Inhale 2 puffs into the lungs every 12 (twelve) hours.   budesonide-formoterol 160-4.5 MCG/ACT inhaler Commonly known as:  SYMBICORT Inhale 2 puffs into the lungs 2 (two) times daily.   DULoxetine 60 MG capsule Commonly known as:  CYMBALTA Take 60 mg by mouth 2 (two) times daily.   estradiol 2 MG tablet Commonly known as:  ESTRACE Take 1 tablet (2 mg total) by mouth daily.   fenofibrate 145 MG tablet Commonly known as:  TRICOR Take 145 mg by mouth at bedtime.   folic acid 1 MG tablet Commonly known as:  FOLVITE Take 1 mg by mouth 2 (two) times daily.   furosemide 40 MG tablet Commonly known as:  LASIX Take 40 mg by mouth daily.   gabapentin 100 MG capsule Commonly known as:  NEURONTIN Take 200-400 mg by mouth See admin instructions. Take 2 capsules every morning and take 4 capsules at bedtime   GLOBAL EASE INJECT PEN NEEDLES 32G X 4 MM Misc Generic drug:  Insulin Pen Needle   guaiFENesin-dextromethorphan 100-10 MG/5ML syrup Commonly known as:  ROBITUSSIN DM Take 5 mLs by mouth every 6 (six) hours as needed for cough.   JARDIANCE 25 MG Tabs tablet Generic drug:  empagliflozin Take 25 mg by mouth daily.   MICROLET LANCETS Misc As directed   pantoprazole 40 MG tablet Commonly known as:  PROTONIX Take 40 mg by mouth daily.   rOPINIRole 4 MG tablet Commonly known as:  REQUIP Take 4 mg by mouth 2 (two) times daily.   spironolactone 25 MG tablet Commonly known as:   ALDACTONE Take 25 mg by mouth daily.   UNABLE TO FIND CPAP at bedtime   VICTOZA 18 MG/3ML Sopn Generic drug:  liraglutide Inject 1.2 mg into the skin daily with lunch.   Vitamin D-3 5000 units Tabs Take 5,000 Units by mouth daily.

## 2017-08-03 ENCOUNTER — Telehealth (HOSPITAL_COMMUNITY): Payer: Self-pay

## 2017-08-03 NOTE — Telephone Encounter (Signed)
Patients insurance is active and benefits verified through [place] - $30.00 co-pay, no deductible, out of pocket amount of $5,900/$729.31 has been met, no co-insurance, and no pre-authorization is required. Reference #[id number]

## 2017-08-03 NOTE — Telephone Encounter (Signed)
Attempted to call patient in regards to Pulmonary Rehab - lm on vm °

## 2017-08-04 DIAGNOSIS — E1129 Type 2 diabetes mellitus with other diabetic kidney complication: Secondary | ICD-10-CM | POA: Diagnosis not present

## 2017-08-04 DIAGNOSIS — E1165 Type 2 diabetes mellitus with hyperglycemia: Secondary | ICD-10-CM | POA: Diagnosis not present

## 2017-08-05 ENCOUNTER — Telehealth: Payer: Self-pay | Admitting: Pulmonary Disease

## 2017-08-05 NOTE — Telephone Encounter (Signed)
Dg Chest 2 View  Result Date: 07/31/2017 CLINICAL DATA:  Short of breath and fatigue EXAM: CHEST - 2 VIEW COMPARISON:  05/03/2017 FINDINGS: Heart is upper normal in size. Subsegmental atelectasis for scarring at the left base. Lungs otherwise clear. No pneumothorax or pleural effusion. IMPRESSION: No active cardiopulmonary disease. Electronically Signed   By: Marybelle Killings M.D.   On: 07/31/2017 12:22    Please let her know CXR showed scarring at lt lung base, likely from prior respiratory infection and nothing to worry about.  Otherwise normal.

## 2017-08-06 ENCOUNTER — Telehealth (HOSPITAL_COMMUNITY): Payer: Self-pay

## 2017-08-06 NOTE — Telephone Encounter (Signed)
Phoned patient, patient made aware of results per MD [facility]. Inquired if further information was needed, patient voiced she had no further questions at this time.

## 2017-08-06 NOTE — Telephone Encounter (Signed)
Patient returned phone call and expressed interest in the Pulmonary Rehab Program. Scheduled orientation on 09/13/2017 at 1:30pm. Patient will attend the 10:30am exc class. Went over insurance with patient, verbalized understanding. Mailed packet.

## 2017-08-09 DIAGNOSIS — G4733 Obstructive sleep apnea (adult) (pediatric): Secondary | ICD-10-CM | POA: Diagnosis not present

## 2017-08-10 ENCOUNTER — Telehealth: Payer: Self-pay | Admitting: Pulmonary Disease

## 2017-08-10 NOTE — Telephone Encounter (Signed)
PA is in progress as of 08/05/2017 Key: [id number]  Takes 7-10 business days for decision, will follow up

## 2017-08-10 NOTE — Telephone Encounter (Signed)
Spoke with [facility], [place] needs PA for [name]. Attempted PA on CMM.    Carmela Hurt Key: [id number] - PA Case ID: [id number] Need help? Call [place] ([phone] Sent to PSanta Margarita400MCG/ACT aerosol powder Form [employer] Medicare Part D ePA Form  I will route to [place].

## 2017-08-11 NOTE — Telephone Encounter (Signed)
Rec'd fax from [place] stating Caprice R[address] is approved from 03-07-2017 to 03-08-2018. Referral number [id number] Nothing further needed at this time

## 2017-08-13 DIAGNOSIS — E1165 Type 2 diabetes mellitus with hyperglycemia: Secondary | ICD-10-CM | POA: Diagnosis not present

## 2017-08-13 DIAGNOSIS — E1129 Type 2 diabetes mellitus with other diabetic kidney complication: Secondary | ICD-10-CM | POA: Diagnosis not present

## 2017-08-13 NOTE — Progress Notes (Signed)
Discharge Progress Report  Patient Details  Name: Kathleen Hatfield MRN: [id number] Date of Birth: Mar 29, 1955 Referring Provider:     Pulmonary Rehab Walk Test from 04/01/2017 in [facility]  Referring Provider  Dr. 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 Halford Chessman      NuStep   Level  1    SPM  70    Minutes  17    METs  1.5      Track   Laps  3    Minutes  17      Prescription Details   Frequency (times per week)  2    Duration  Progress to 45 minutes of aerobic exercise without signs/symptoms of physical distress      Intensity   THRR 40-80% of Max Heartrate  63-126    Ratings of Perceived Exertion  11-13    Perceived Dyspnea  0-4      Progression   Progression  Continue progressive overload as per policy without signs/symptoms or physical distress.      Resistance Training   Training Prescription  Yes    Weight  orange bands    Reps  10-15       Discharge Exercise Prescription (Final Exercise Prescription Changes): Exercise Prescription Changes - 06/08/17 1623      Response to Exercise   Blood Pressure (Admit)  --    Blood Pressure (Exercise)  --    Blood Pressure (Exit)  --    Heart Rate (Admit)  --    Heart Rate (Exercise)  --     Heart Rate (Exit)  --    Oxygen Saturation (Admit)  --    Oxygen Saturation (Exercise)  --    Oxygen Saturation (Exit)  --    Rating of Perceived Exertion (Exercise)  --    Perceived Dyspnea (Exercise)  --    Duration  --    Intensity  --      Progression   Progression  --      Resistance Training   Training Prescription  --    Weight  --    Reps  --    Time  --      NuStep   Level  --    Minutes  --    METs  --      Home Exercise Plan   Plans to continue exercise at  --    Frequency  --       Functional Capacity: 6 Minute Walk    Row Name  04/02/17 0711         6 Minute Walk   Phase  Initial     Distance  500 feet     Walk Time  - 5 minutes 22 seconds     # of Rest Breaks  - 1 standing break lasting 38 seconds     MPH  1     METS  1.7     RPE  14     Perceived Dyspnea   4     Symptoms  Yes (comment)     Comments  Used rollator-back pain 8/10      Resting HR  100 bpm     Resting BP  148/91     Resting Oxygen Saturation   93 %     Exercise Oxygen Saturation  during 6 min walk  92 %     Max Ex. HR  115 bpm     Max Ex. BP  144/82       Interval HR   1 Minute HR  110     2 Minute HR  116     3 Minute HR  116     4 Minute HR  115     5 Minute HR  114     6 Minute HR  114     2 Minute Post HR  105     Interval Heart Rate?  Yes       Interval Oxygen   Interval Oxygen?  Yes     Baseline Oxygen Saturation %  93 %     1 Minute Oxygen Saturation %  92 %     1 Minute Liters of Oxygen  0 L     2 Minute Oxygen Saturation %  93 %     2 Minute Liters of Oxygen  0 L     3 Minute Oxygen Saturation %  93 %     3 Minute Liters of Oxygen  0 L     4 Minute Oxygen Saturation %  93 %     4 Minute Liters of Oxygen  0 L     5 Minute Oxygen Saturation %  94 %     5 Minute Liters of Oxygen  0 L     6 Minute Oxygen Saturation %  94 %     6 Minute Liters of Oxygen  0 L     2 Minute Post Oxygen Saturation %  96 %     2 Minute Post Liters of Oxygen  0 L         Psychological, QOL, Others - Outcomes: PHQ 2/9: Depression screen PHQ 2/9 03/29/2017  Decreased Interest 3  Down, Depressed, Hopeless 2  PHQ - 2 Score 5  Altered sleeping 3  Tired, decreased energy 3  Change in appetite 1  Feeling bad or failure about yourself  3  Trouble concentrating 3  Moving slowly or fidgety/restless 2  Suicidal thoughts 0  PHQ-9 Score 20  Difficult doing work/chores Extremely dIfficult    Quality of Life:   Personal Goals: Goals established at orientation with interventions provided to work toward goal. Personal Goals and Risk Factors at Admission - 03/29/17 1306      Core Components/Risk Factors/Patient Goals on Admission    Weight Management  Obesity;Weight Loss    Improve shortness of breath with ADL's  Yes    Intervention  Provide education, individualized exercise plan and daily activity instruction to help decrease symptoms  of SOB with activities of daily living.    Expected Outcomes  Short Term: Improve cardiorespiratory fitness to achieve a reduction of symptoms when performing ADLs;Long Term: Be able to perform more ADLs without symptoms or delay the onset of symptoms    Heart Failure  Yes    Intervention  Provide a combined exercise and nutrition program that is supplemented with education, support and counseling about heart failure. Directed toward relieving symptoms such as shortness of breath, decreased exercise tolerance, and extremity edema.    Expected Outcomes  Improve functional capacity of life;Short term: Attendance in program 2-3 days a week with increased exercise capacity. Reported lower sodium intake. Reported increased fruit and vegetable intake. Reports medication compliance.;Short term: Daily weights obtained and reported for increase. Utilizing diuretic protocols set by physician.;Long term: Adoption of self-care skills and reduction of barriers for early signs and symptoms recognition and intervention leading to self-care  maintenance.    Stress  Yes    Intervention  Offer individual and/or small group education and counseling on adjustment to heart disease, stress management and health-related lifestyle change. Teach and support self-help strategies.;Refer participants experiencing significant psychosocial distress to appropriate mental health specialists for further evaluation and treatment. When possible, include family members and significant others in education/counseling sessions.    Expected Outcomes  Short Term: Participant demonstrates changes in health-related behavior, relaxation and other stress management skills, ability to obtain effective social support, and compliance with psychotropic medications if prescribed.;Long Term: Emotional wellbeing is indicated by absence of clinically significant psychosocial distress or social isolation.        Personal Goals Discharge: Goals and Risk Factor Review    Row Name 04/19/17 2505 05/10/17 1204 06/14/17 1622         Core Components/Risk Factors/Patient Goals Review   Personal Goals Review  Weight Management/Obesity;Improve shortness of breath with ADL's;Develop more efficient breathing techniques such as purse lipped breathing and diaphragmatic breathing and practicing self-pacing with activity.;Stress  Weight Management/Obesity;Improve shortness of breath with ADL's;Develop more efficient breathing techniques such as purse lipped breathing and diaphragmatic breathing and practicing self-pacing with activity.;Stress  Weight Management/Obesity;Improve shortness of breath with ADL's;Develop more efficient breathing techniques such as purse lipped breathing and diaphragmatic breathing and practicing self-pacing with activity.;Stress     Review  Just started program, has attended 3 exercise sessions, multiple health issues, chronic pain in her hips and back have limited her from walking on the track.  In hopes she can do the program to graduation, but afraid her  chronic pain will drastically slow her progress.  has attended 4 exercise sessions, was hospitalized last week with influenza, will be out this week to get her strength back and will return next week.  Due to illness unable to meet admission goals.  patient continues to have medical issues that interfere with attendance to pulmonary rehab. as of last week, patient was diagnosed with shingles. she will not return this week and we will readdress on Monday of next. she has not lost any weight however she has not changed her eating habits related to other medical issues that has taken precidences. she works hard when she is in attendance and has even self increased workloads on equiptment. as of now she has not shown much progression towards pulmonary goals. expect to see better progress towards pulmonary rehab goals over the next 30 days if she is able to have consistant attendance.     Expected Outcomes  Slow progression d/t chronic pain issues.  Slow progression d/t chronic pain issues and recent hospitalization.  see "admission" expected outcomes        Exercise Goals and Review:   Nutrition & Weight - Outcomes: Pre Biometrics - 06/08/17 1600      Pre Biometrics   Weight  --    BMI (Calculated)  --        Nutrition: Nutrition Therapy & Goals - 04/15/17 1524      Nutrition Therapy   Diet  Carb Modified, Heart Healthy      Personal Nutrition Goals   Nutrition Goal  Identify food quantities necessary to achieve wt loss of  -2# per week to a goal wt loss of 6-24 lb at graduation from pulmonary rehab.    Personal Goal #2  CBG's in the normal range or as close to normal as is safely possible.      Intervention Plan   Intervention  Prescribe, educate and counsel regarding individualized specific dietary modifications aiming towards targeted core components such as weight, hypertension, lipid management, diabetes, heart failure and other comorbidities.    Expected Outcomes  Short Term Goal:  Understand basic principles of dietary content, such as calories, fat, sodium, cholesterol and nutrients.;Long Term Goal: Adherence to prescribed nutrition plan.       Nutrition Discharge: Nutrition Assessments - 04/15/17 1524      Rate Your Plate Scores   Pre Score  53       Education Questionnaire Score:   Goals reviewed with patient; copy given to patient.

## 2017-08-13 NOTE — Addendum Note (Signed)
Encounter addended by: [name] M, RCEP on: 08/13/2017 2:19 PM  Actions taken: Episode resolved, Sign clinical note

## 2017-08-19 DIAGNOSIS — N289 Disorder of kidney and ureter, unspecified: Secondary | ICD-10-CM | POA: Diagnosis not present

## 2017-08-19 DIAGNOSIS — Z6841 Body Mass Index (BMI) 40.0 and over, adult: Secondary | ICD-10-CM | POA: Diagnosis not present

## 2017-08-19 DIAGNOSIS — E1165 Type 2 diabetes mellitus with hyperglycemia: Secondary | ICD-10-CM | POA: Diagnosis not present

## 2017-08-19 DIAGNOSIS — M7051 Other bursitis of knee, right knee: Secondary | ICD-10-CM | POA: Diagnosis not present

## 2017-08-20 NOTE — Addendum Note (Signed)
Encounter addended by: Ivonne Andrew, RD on: 08/20/2017 10:26 AM  Actions taken: Flowsheet data copied forward, Visit Navigator Flowsheet section accepted

## 2017-09-01 DIAGNOSIS — K219 Gastro-esophageal reflux disease without esophagitis: Secondary | ICD-10-CM | POA: Diagnosis not present

## 2017-09-01 DIAGNOSIS — E1129 Type 2 diabetes mellitus with other diabetic kidney complication: Secondary | ICD-10-CM | POA: Diagnosis not present

## 2017-09-07 DIAGNOSIS — K5190 Ulcerative colitis, unspecified, without complications: Secondary | ICD-10-CM | POA: Diagnosis not present

## 2017-09-07 DIAGNOSIS — N289 Disorder of kidney and ureter, unspecified: Secondary | ICD-10-CM | POA: Diagnosis not present

## 2017-09-07 DIAGNOSIS — R69 Illness, unspecified: Secondary | ICD-10-CM | POA: Diagnosis not present

## 2017-09-10 DIAGNOSIS — G4733 Obstructive sleep apnea (adult) (pediatric): Secondary | ICD-10-CM | POA: Diagnosis not present

## 2017-09-13 ENCOUNTER — Encounter (HOSPITAL_COMMUNITY): Payer: Self-pay

## 2017-09-13 ENCOUNTER — Encounter (HOSPITAL_COMMUNITY)
Admission: RE | Admit: 2017-09-13 | Discharge: 2017-09-13 | Disposition: A | Discharge status: Home / Self Care | Payer: Medicare HMO | Source: Ambulatory Visit | Attending: Pulmonary Disease | Admitting: Pulmonary Disease

## 2017-09-13 VITALS — BP 131/63 | HR 94 | Temp 98.4°F | Resp 20 | Ht 64.75 in | Wt 270.9 lb

## 2017-09-13 DIAGNOSIS — J449 Chronic obstructive pulmonary disease, unspecified: Secondary | ICD-10-CM | POA: Insufficient documentation

## 2017-09-13 NOTE — Progress Notes (Signed)
Kathleen Hatfield 61 y.o. female Pulmonary Rehab Orientation Note Patient who formerly was in pulmonary rehab this past spring,  arrived today in Cardiac and Pulmonary Rehab for orientation to Pulmonary Rehab. She was dropped off by her significant other at the [facility] entrance.  Pt has her parking badge access card from her prior participation this past spring.. She does not carry portable oxygen. Per pt, she uses oxygen never. Color good, skin warm and dry. Patient is oriented to time and place. Patient's medical history, psychosocial health, and medications reviewed. Psychosocial assessment reveals pt lives with her significant other. Pt is currently retired. Pt hobbies include selling items on [employer]. Pt reports her stress level is moderate. Areas of stress/anxiety include Health pyschosocial  well being..  Pt exhibits signs of depression. Signs of depression include crying spells and difficulty falling asleep, difficulty maintaining sleep, early morning awakenings and fatigue. PHQ2/9 score 6/22. Pt shows fair  coping skills with negative outlook .Pt is not hopeful about her future due to having one medical setback after another. Pt  offered emotional support and reassurance. Will continue to monitor and evaluate progress toward psychosocial goal(s) of less tearful episodes. Physical assessment reveals heart rate is normal, breath sounds clear to auscultation, no wheezes, rales, or rhonchi. Grip strength equal, strong. Distal pulses palpable with trace swelling. Patient reports she does take medications as prescribed but is in the process of working with a pharmacist for medication managament. Pt is unsure of the plan and this creates some anxiety for her. Patient states she follows a Regular diet. The patient reports no specific efforts to gain or lose weight. However pt would like to lose 20 pounds. Patient's weight will be monitored closely. Demonstration and practice of PLB using pulse oximeter. 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  Pt does not use a pulse oximeter because her "numbers are always good". Patient able to return demonstration satisfactorily. Safety and hand hygiene in the exercise area reviewed with patient. Patient voices understanding of the information reviewed. Department expectations discussed with patient and achievable goals were set. The patient shows enthusiasm about attending the program and we look forward to working with this nice . The patient is scheduled for a 6 min walk test on 7/18 and to begin exercise on 7/25.  45 minutes was spent on a variety of activities such as assessment of the patient, obtaining baseline data including height, weight, BMI, and grip strength, verifying medical history, allergies, and current medications, and teaching patient strategies for performing tasks with less respiratory effort with emphasis on pursed lip breathing.  Maurice Small RN, BSN Cardiac and Environmental education officer [place]

## 2017-09-22 DIAGNOSIS — R300 Dysuria: Secondary | ICD-10-CM | POA: Diagnosis not present

## 2017-09-22 DIAGNOSIS — N289 Disorder of kidney and ureter, unspecified: Secondary | ICD-10-CM | POA: Diagnosis not present

## 2017-09-22 DIAGNOSIS — Z79899 Other long term (current) drug therapy: Secondary | ICD-10-CM | POA: Diagnosis not present

## 2017-09-22 DIAGNOSIS — Z6841 Body Mass Index (BMI) 40.0 and over, adult: Secondary | ICD-10-CM | POA: Diagnosis not present

## 2017-09-23 ENCOUNTER — Ambulatory Visit (HOSPITAL_COMMUNITY): Payer: Medicare HMO

## 2017-09-28 ENCOUNTER — Encounter (HOSPITAL_COMMUNITY)
Admission: RE | Admit: 2017-09-28 | Discharge: 2017-09-28 | Disposition: A | Discharge status: Home / Self Care | Payer: Medicare HMO | Source: Ambulatory Visit | Attending: Pulmonary Disease | Admitting: Pulmonary Disease

## 2017-09-28 DIAGNOSIS — J449 Chronic obstructive pulmonary disease, unspecified: Secondary | ICD-10-CM | POA: Diagnosis not present

## 2017-09-30 ENCOUNTER — Encounter (HOSPITAL_COMMUNITY): Payer: Medicare HMO

## 2017-09-30 NOTE — Progress Notes (Signed)
Pulmonary Individual Treatment Plan  Patient Details  Name: TAMKIA TEMPLES MRN: [id number] Date of Birth: 12/31/1955 Referring Provider:     Pulmonary Rehab Walk Test from 09/28/2017 in [place]  Referring Provider  Dr. 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  Aclidinium Bromide (TUDORZA PRESSAIR) 400 MCG/ACT AEPB, Inhale 1 puff into the lungs 2 (two) times daily., Disp: 1 each, Rfl: 5 .  albuterol (PROAIR HFA) 108 (90 Base) MCG/ACT inhaler, INHALE 2 PUFFS INTO THE LUNGS EVERY FOUR HOURS AS NEEDED FOR WHEEZING., Disp: 8.5 g, Rfl: 5 .  BAYER CONTOUR NEXT TEST test strip, As directed, Disp: , Rfl:  .  budesonide-formoterol (SYMBICORT) 160-4.5 MCG/ACT inhaler, Inhale 2 puffs into the lungs 2 (two) times daily., Disp: 1 Inhaler, Rfl: 5 .  Cholecalciferol (VITAMIN D-3) 5000 UNITS TABS, Take 5,000 Units by mouth daily. , Disp: , Rfl:  .  DULoxetine (CYMBALTA) 60 MG capsule, Take 60 mg by mouth daily. , Disp: , Rfl:  .  fenofibrate (TRICOR) 145 MG tablet, Take 145 mg by mouth at bedtime. , Disp: , Rfl: 11 .  furosemide (LASIX) 40 MG tablet, Take 40 mg by mouth daily. 09/13/17  Pt takes 1 1/2 daily, Disp: , Rfl:  .  gabapentin (NEURONTIN) 100 MG capsule, Take 200-400 mg by mouth See admin instructions. Take 3 capsules at bedtime, Disp: , Rfl:  .  GLOBAL EASE INJECT PEN NEEDLES 32G X 4 MM MISC, , Disp: , Rfl:  .  guaiFENesin-dextromethorphan (ROBITUSSIN DM) 100-10 MG/5ML syrup, Take 5 mLs by mouth every 6 (six) hours as needed for cough., Disp: 118 mL, Rfl: 0 .  Insulin Degludec (TRESIBA FLEXTOUCH) 200 UNIT/ML SOPN, Inject 74 Units into the skin daily., Disp: , Rfl:  .  Liraglutide (VICTOZA) 18 MG/3ML SOPN, Inject 1.8  mg into the skin daily with lunch. , Disp: , Rfl:  .  lisinopril (PRINIVIL,ZESTRIL) 10 MG tablet, Take 10 mg by mouth daily., Disp: , Rfl:  .  metolazone (ZAROXOLYN) 5 MG tablet, Take 5 mg by mouth daily as needed (Take with Lasix when weight is elevated 3 pounds overnight and 5 pounds or more for 2 days)., Disp: , Rfl:  .  MICROLET LANCETS MISC, As directed, Disp: , Rfl:  .  pantoprazole (PROTONIX) 40 MG tablet, Take 40 mg by mouth daily. , Disp: , Rfl:  .  potassium chloride (K-DUR,KLOR-CON) 10 MEQ tablet, Take 10 mEq by mouth 2 (two) times daily., Disp: , Rfl:  .  rOPINIRole (REQUIP) 4 MG tablet, Take 4 mg by mouth 2 (two) times daily. 7/8 Pt take 1 in the morning and 1 1/2 mg at bedtime, Disp: , Rfl:  .  rosuvastatin (CRESTOR) 10 MG tablet, Take 10 mg by mouth once a week., Disp: , Rfl:  .  UNABLE TO FIND, CPAP at bedtime, Disp: , Rfl:   Past Medical History: Past Medical History:  Diagnosis Date  . Anemia   . Arthritis   . Asthma   . CHF (congestive heart failure) ([place]) 05/2014  . Chronic diastolic heart failure ([place])   . CKD (chronic kidney disease)   . Depression   . Diabetes mellitus without complication ([place])   .  Diverticulitis   . Dyspnea    with exertion  . GERD (gastroesophageal reflux disease)   . History of blood transfusion   . Hyperlipidemia    cannot tolerate statins  . Hypertension   . Peripheral neuropathy   . Pneumonia   . PONV (postoperative nausea and vomiting)   . Sleep apnea    uses CPAP  . Ulcerative colitis ([place])    dr Collene Mares    Tobacco Use: Social History   Tobacco Use  Smoking Status Never Smoker  Smokeless Tobacco Never Used    Labs: Recent Review Flowsheet Data    Labs for ITP Cardiac and Pulmonary Rehab Latest Ref Rng & Units 10/21/2015 10/22/2015 04/02/2016 04/03/2016 05/04/2017   Cholestrol 0 - 200 mg/dL - - - - -   LDLCALC 0 - 99 mg/dL - - - - -   HDL >39 mg/dL - - - - -   Trlycerides <150 mg/dL - - - - -   Hemoglobin A1c 4.8 - 5.6 %  7.4(H) - 7.6(H) - 8.5(H)   PHART 7.350 - 7.450 - 7.438 - 7.456(H) -   PCO2ART 32.0 - 48.0 mmHg - 41.0 - 45.2 -   HCO3 20.0 - 28.0 mmol/L - 27.3(H) - 31.4(H) -   TCO2 0 - 100 mmol/L - 25.1 - - -   O2SAT % - 95.7 - 90.6 -      Capillary Blood Glucose: Lab Results  Component Value Date   GLUCAP 204 (H) 05/06/2017   GLUCAP 266 (H) 05/06/2017   GLUCAP 221 (H) 05/05/2017   GLUCAP 211 (H) 05/05/2017   GLUCAP 291 (H) 05/05/2017   POCT Glucose    Row Name 04/13/17 1531 04/15/17 1610 05/27/17 1605 06/03/17 1630       POCT Blood Glucose   Pre-Exercise  216 mg/dL  155 mg/dL  230 mg/dL  225 mg/dL    Post-Exercise  173 mg/dL  125 mg/dL  206 mg/dL  140 mg/dL       Pulmonary Assessment Scores: Pulmonary Assessment Scores    Row Name 09/30/17 0734         ADL UCSD   ADL Phase  Entry       mMRC Score   mMRC Score  4        Pulmonary Function Assessment:   Exercise Target Goals: Date: 09/28/17  Exercise Program Goal: Individual exercise prescription set using results from initial 6 min walk test and THRR while considering  patient's activity barriers and safety.    Exercise Prescription Goal: Initial exercise prescription builds to 30-45 minutes a day of aerobic activity, 2-3 days per week.  Home exercise guidelines will be given to patient during program as part of exercise prescription that the participant will acknowledge.  Activity Barriers & Risk Stratification: Activity Barriers & Cardiac Risk Stratification - 09/13/17 1612      Activity Barriers & Cardiac Risk Stratification   Activity Barriers  Joint Problems;Back Problems;Shortness of Breath;History of Falls;Balance Concerns;Arthritis       6 Minute Walk: 6 Minute Walk    Row Name 09/30/17 0730         6 Minute Walk   Phase  Initial     Distance  631 feet     Walk Time  - 4 minutes 25 seconds     # of Rest Breaks  2 1 minute 35 seconds total     MPH  1.19     METS  1.92     RPE  15     Perceived  Dyspnea   3     Symptoms  Yes (comment)     Comments  9/10 sciatica pain     Resting HR  90 bpm     Resting BP  118/60     Resting Oxygen Saturation   90 %     Exercise Oxygen Saturation  during 6 min walk  90 %     Max Ex. HR  107 bpm     Max Ex. BP  140/58       Interval HR   1 Minute HR  106     2 Minute HR  107     3 Minute HR  106     4 Minute HR  104     5 Minute HR  104     6 Minute HR  104     2 Minute Post HR  95     Interval Heart Rate?  Yes       Interval Oxygen   Interval Oxygen?  Yes     Baseline Oxygen Saturation %  90 %     1 Minute Oxygen Saturation %  91 %     1 Minute Liters of Oxygen  0 L     2 Minute Oxygen Saturation %  92 %     2 Minute Liters of Oxygen  0 L     3 Minute Oxygen Saturation %  93 %     3 Minute Liters of Oxygen  0 L     4 Minute Oxygen Saturation %  95 %     4 Minute Liters of Oxygen  0 L     5 Minute Oxygen Saturation %  95 %     5 Minute Liters of Oxygen  0 L     6 Minute Oxygen Saturation %  95 %     6 Minute Liters of Oxygen  0 L     2 Minute Post Oxygen Saturation %  95 %     2 Minute Post Liters of Oxygen  0 L        Oxygen Initial Assessment: Oxygen Initial Assessment - 09/30/17 0730      Initial 6 min Walk   Oxygen Used  None      Program Oxygen Prescription   Program Oxygen Prescription  None       Oxygen Re-Evaluation: Oxygen Re-Evaluation    Row Name 04/16/17 1150 05/17/17 0904 06/11/17 1517         Program Oxygen Prescription   Program Oxygen Prescription  None  None  None       Home Oxygen   Home Oxygen Device  None  None  None     Sleep Oxygen Prescription  CPAP  CPAP  CPAP     Home Exercise Oxygen Prescription  None  None  None     Home at Rest Exercise Oxygen Prescription  None  None  None     Compliance with Home Oxygen Use  Yes  Yes  Yes       Goals/Expected Outcomes   Goals/Expected Outcomes  -  -  maintain compliance with CPAP        Oxygen Discharge (Final Oxygen Re-Evaluation): Oxygen  Re-Evaluation - 06/11/17 1517      Program Oxygen Prescription   Program Oxygen Prescription  None      Home Oxygen   Home Oxygen Device  None  Sleep Oxygen Prescription  CPAP    Home Exercise Oxygen Prescription  None    Home at Rest Exercise Oxygen Prescription  None    Compliance with Home Oxygen Use  Yes      Goals/Expected Outcomes   Goals/Expected Outcomes  maintain compliance with CPAP       Initial Exercise Prescription: Initial Exercise Prescription - 09/30/17 0700      Date of Initial Exercise RX and Referring Provider   Date  09/28/17    Referring Provider  Dr. Halford Chessman      NuStep   Level  3    SPM  80    Minutes  17    METs  1.5      Arm Ergometer   Level  1    Watts  10    Minutes  17      Track   Laps  4    Minutes  17      Prescription Details   Frequency (times per week)  2    Duration  Progress to 45 minutes of aerobic exercise without signs/symptoms of physical distress      Intensity   THRR 40-80% of Max Heartrate  63-126    Ratings of Perceived Exertion  11-13    Perceived Dyspnea  0-4      Progression   Progression  Continue progressive overload as per policy without signs/symptoms or physical distress.      Resistance Training   Training Prescription  Yes    Weight  blue bands    Reps  10-15       Perform Capillary Blood Glucose checks as needed.  Exercise Prescription Changes: Exercise Prescription Changes    Row Name 04/13/17 1531 04/20/17 1541 05/27/17 1600 06/03/17 1631       Response to Exercise   Blood Pressure (Admit)  126/64  120/56  136/66  124/76    Blood Pressure (Exercise)  130/62  134/62  140/70  150/60    Blood Pressure (Exit)  140/68  108/68  120/60  110/60    Heart Rate (Admit)  90 bpm  90 bpm  102 bpm  89 bpm    Heart Rate (Exercise)  102 bpm  101 bpm  105 bpm  107 bpm    Heart Rate (Exit)  98 bpm  99 bpm  96 bpm  95 bpm    Oxygen Saturation (Admit)  93 %  94 %  92 %  93 %    Oxygen Saturation (Exercise)   94 %  92 %  95 %  95 %    Oxygen Saturation (Exit)  95 %  94 %  96 %  95 %    Rating of Perceived Exertion (Exercise)  13  17  13  13     Perceived Dyspnea (Exercise)  2  3  3  3     Duration  Progress to 45 minutes of aerobic exercise without signs/symptoms of physical distress  Progress to 45 minutes of aerobic exercise without signs/symptoms of physical distress  Progress to 45 minutes of aerobic exercise without signs/symptoms of physical distress  Progress to 45 minutes of aerobic exercise without signs/symptoms of physical distress    Intensity  THRR unchanged  THRR unchanged  THRR unchanged  THRR unchanged      Progression   Progression  Continue to progress workloads to maintain intensity without signs/symptoms of physical distress.  Continue to progress workloads to maintain intensity without signs/symptoms of physical  distress.  Continue to progress workloads to maintain intensity without signs/symptoms of physical distress.  Continue to progress workloads to maintain intensity without signs/symptoms of physical distress.      Resistance Training   Training Prescription  Yes  Yes  Yes  Yes    Weight  orange bands  orange bands  orange bands  orange bands    Reps  10-15  10-15  10-15  10-15    Time  -  10 Minutes  10 Minutes  10 Minutes      Oxygen   Oxygen  - room air  -  -  -      NuStep   Level  1  2  4  5     SPM  70  70  -  80    Minutes  51  34  34  27    METs  1.8  1.9  2  2.2      Track   Laps  3  4  -  -    Minutes  17  17  -  -      Home Exercise Plan   Plans to continue exercise at  -  -  Home (comment)  -    Frequency  -  -  Add 2 additional days to program exercise sessions.  -       Exercise Comments: Exercise Comments    Row Name 05/28/17 0704           Exercise Comments  Home exercise completed          Exercise Goals and Review: Exercise Goals    [place] Name 09/13/17 1436             Exercise Goals   Increase Physical Activity  Yes        Intervention  Provide advice, education, support and counseling about physical activity/exercise needs.;Develop an individualized exercise prescription for aerobic and resistive training based on initial evaluation findings, risk stratification, comorbidities and participant's personal goals.       Expected Outcomes  Short Term: Attend rehab on a regular basis to increase amount of physical activity.;Long Term: Add in home exercise to make exercise part of routine and to increase amount of physical activity.;Long Term: Exercising regularly at least 3-5 days a week.       Increase Strength and Stamina  Yes       Intervention  Provide advice, education, support and counseling about physical activity/exercise needs.;Develop an individualized exercise prescription for aerobic and resistive training based on initial evaluation findings, risk stratification, comorbidities and participant's personal goals.       Expected Outcomes  Short Term: Increase workloads from initial exercise prescription for resistance, speed, and METs.;Short Term: Perform resistance training exercises routinely during rehab and add in resistance training at home;Long Term: Improve cardiorespiratory fitness, muscular endurance and strength as measured by increased METs and functional capacity (6MWT)       Able to understand and use rate of perceived exertion (RPE) scale  Yes       Intervention  Provide education and explanation on how to use RPE scale       Expected Outcomes  Short Term: Able to use RPE daily in rehab to express subjective intensity level;Long Term:  Able to use RPE to guide intensity level when exercising independently       Able to understand and use Dyspnea scale  Yes       Intervention  Provide education and explanation on how to use Dyspnea scale       Expected Outcomes  Short Term: Able to use Dyspnea scale daily in rehab to express subjective sense of shortness of breath during exertion;Long Term: Able to use  Dyspnea scale to guide intensity level when exercising independently       Knowledge and understanding of Target Heart Rate Range (THRR)  Yes       Intervention  Provide education and explanation of THRR including how the numbers were predicted and where they are located for reference       Expected Outcomes  Short Term: Able to state/look up THRR;Short Term: Able to use daily as guideline for intensity in rehab;Long Term: Able to use THRR to govern intensity when exercising independently       Intervention  Provide education, explanation, and written materials on patient's individual exercise prescription       Expected Outcomes  Short Term: Able to explain program exercise prescription;Long Term: Able to explain home exercise prescription to exercise independently          Exercise Goals Re-Evaluation : Exercise Goals Re-Evaluation    Row Name 04/16/17 1151 05/17/17 0904 06/11/17 1517         Exercise Goal Re-Evaluation   Exercise Goals Review  Increase Strength and Stamina;Able to understand and use Dyspnea scale;Able to understand and use rate of perceived exertion (RPE) scale;Increase Physical Activity;Knowledge and understanding of Target Heart Rate Range (THRR);Understanding of Exercise Prescription  Increase Strength and Stamina;Able to understand and use Dyspnea scale;Able to understand and use rate of perceived exertion (RPE) scale;Increase Physical Activity;Knowledge and understanding of Target Heart Rate Range (THRR);Understanding of Exercise Prescription  Increase Strength and Stamina;Able to understand and use Dyspnea scale;Able to understand and use rate of perceived exertion (RPE) scale;Increase Physical Activity;Knowledge and understanding of Target Heart Rate Range (THRR);Understanding of Exercise Prescription     Comments  Patient has only attended three rehab sessions. Will cont. to educate and encourage.   Patient has only attended 4 rehab sessions. Has been out with flu since  04/20/17. Will cont to monitor and progress as tolerated once she returns to rehab.   The patient has been motivated to make changes. However, now she is out of rehab due to the shingles. Patient is able to walk up to 9 laps (200 ft) in 15 minutes. Will cont. to monitor and progress as able.     Expected Outcomes  Through exercise at rehab and at home, patient will increase strength and stamina making ADL's easier to perform. Patient will also have a better understanding of safe exercise and what they are capable to do outside of clinical supervision.  Through exercise at rehab and at home, patient will increase strength and stamina making ADL's easier to perform. Patient will also have a better understanding of safe exercise and what they are capable to do outside of clinical supervision.  Through exercise at rehab and at home, patient will increase strength and stamina making ADL's easier to perform. Patient will also have a better understanding of safe exercise and what they are capable to do outside of clinical supervision.        Discharge Exercise Prescription (Final Exercise Prescription Changes): Exercise Prescription Changes - 06/03/17 1631      Response to Exercise   Blood Pressure (Admit)  124/76    Blood Pressure (Exercise)  150/60    Blood Pressure (Exit)  110/60    Heart Rate (  Admit)  89 bpm    Heart Rate (Exercise)  107 bpm    Heart Rate (Exit)  95 bpm    Oxygen Saturation (Admit)  93 %    Oxygen Saturation (Exercise)  95 %    Oxygen Saturation (Exit)  95 %    Rating of Perceived Exertion (Exercise)  13    Perceived Dyspnea (Exercise)  3    Duration  Progress to 45 minutes of aerobic exercise without signs/symptoms of physical distress    Intensity  THRR unchanged      Progression   Progression  Continue to progress workloads to maintain intensity without signs/symptoms of physical distress.      Resistance Training   Training Prescription  Yes    Weight  orange bands     Reps  10-15    Time  10 Minutes      NuStep   Level  5    SPM  80    Minutes  27    METs  2.2       Nutrition:  Target Goals: Understanding of nutrition guidelines, daily intake of sodium <1587m, cholesterol <2045m calories 30% from fat and 7% or less from saturated fats, daily to have 5 or more servings of fruits and vegetables.  Biometrics:    Nutrition Therapy Plan and Nutrition Goals: Nutrition Therapy & Goals - 04/15/17 1524      Nutrition Therapy   Diet  Carb Modified, Heart Healthy      Personal Nutrition Goals   Nutrition Goal  Identify food quantities necessary to achieve wt loss of  -2# per week to a goal wt loss of 6-24 lb at graduation from pulmonary rehab.    Personal Goal #2  CBG's in the normal range or as close to normal as is safely possible.      Intervention Plan   Intervention  Prescribe, educate and counsel regarding individualized specific dietary modifications aiming towards targeted core components such as weight, hypertension, lipid management, diabetes, heart failure and other comorbidities.    Expected Outcomes  Short Term Goal: Understand basic principles of dietary content, such as calories, fat, sodium, cholesterol and nutrients.;Long Term Goal: Adherence to prescribed nutrition plan.       Nutrition Assessments: Nutrition Assessments - 08/20/17 1020      Rate Your Plate Scores   Pre Score  53    Post Score  -- no post test available to assess at this time       Nutrition Goals Re-Evaluation: Nutrition Goals Re-Evaluation    [place] 08/20/17 1016 08/20/17 1019           Goals   Current Weight  273 lb 13 oz (124.2 kg)  273 lb 13 oz (124.2 kg)      Nutrition Goal  Identify food quantities necessary to achieve wt loss of  -2# per week to a goal wt loss of 6-24 lb at graduation from pulmonary rehab.  Identify food quantities necessary to achieve wt loss of  -2# per week to a goal wt loss of 6-24 lb at graduation from pulmonary rehab.       Comment  pt maintained weight, weight loss goal not met  pt maintained weight, weight loss goal not met        Personal Goal #2 Re-Evaluation   Personal Goal #2  CBG's in the normal range or as close to normal as is safely possible.  personal goal for CBG's in the normal range not  met, as identified by CBG's pre and post exercise         Nutrition Goals Discharge (Final Nutrition Goals Re-Evaluation): Nutrition Goals Re-Evaluation - 08/20/17 1019      Goals   Current Weight  273 lb 13 oz (124.2 kg)    Nutrition Goal  Identify food quantities necessary to achieve wt loss of  -2# per week to a goal wt loss of 6-24 lb at graduation from pulmonary rehab.    Comment  pt maintained weight, weight loss goal not met      Personal Goal #2 Re-Evaluation   Personal Goal #2  personal goal for CBG's in the normal range not met, as identified by CBG's pre and post exercise       Psychosocial: Target Goals: Acknowledge presence or absence of significant depression and/or stress, maximize coping skills, provide positive support system. Participant is able to verbalize types and ability to use techniques and skills needed for reducing stress and depression.  Initial Review & Psychosocial Screening: Initial Psych Review & Screening - 09/13/17 1444      Initial Review   Current issues with  Current Depression;Current Stress Concerns    Source of Stress Concerns  Unable to participate in former interests or hobbies;Chronic Illness;Unable to perform yard/household activities      Nathalie?  Yes      Barriers   Psychosocial barriers to participate in program  The patient should benefit from training in stress management and relaxation.      Screening Interventions   Interventions  Encouraged to exercise    Expected Outcomes  Long Term Goal: Stressors or current issues are controlled or eliminated.;Short Term goal: Identification and review with participant of any  Quality of Life or Depression concerns found by scoring the questionnaire.;Long Term goal: The participant improves quality of Life and PHQ9 Scores as seen by post scores and/or verbalization of changes       Quality of Life Scores:  Scores of 19 and below usually indicate a poorer quality of life in these areas.  A difference of  2-3 points is a clinically meaningful difference.  A difference of 2-3 points in the total score of the Quality of Life Index has been associated with significant improvement in overall quality of life, self-image, physical symptoms, and general health in studies assessing change in quality of life.   PHQ-9: Recent Review Flowsheet Data    Depression screen [facility] 2/9 09/13/2017 03/29/2017   Decreased Interest 3 3   Down, Depressed, Hopeless 3 2   PHQ - 2 Score 6 5   Altered sleeping 3 3   Tired, decreased energy 3 3   Change in appetite 1 1   Feeling bad or failure about yourself  3 3   Trouble concentrating 3 3   Moving slowly or fidgety/restless 3 2   Suicidal thoughts 0 0   PHQ-9 Score 22 20   Difficult doing work/chores Very difficult Extremely dIfficult     Interpretation of Total Score  Total Score Depression Severity:  1-4 = Minimal depression, 5-9 = Mild depression, 10-14 = Moderate depression, 15-19 = Moderately severe depression, 20-27 = Severe depression   Psychosocial Evaluation and Intervention: Psychosocial Evaluation - 09/13/17 1608      Psychosocial Evaluation & Interventions   Interventions  Encouraged to exercise with the program and follow exercise prescription    Comments  Pt had a change in her medications for depression.  Pt  cymbalta decreased to 60 mg daily.  Pt notes that she is more tearfull and this is observed during the orientation intake.  This is managed by her PCP     Expected Outcomes  Pt will report less to none tearful episodes.  Less dependency on others to help with ADL' such as cooking, house cleaning and laundry. Pt will  have have positive outlook and healthy coping skills.     Continue Psychosocial Services   Follow up required by staff       Psychosocial Re-Evaluation: Psychosocial Re-Evaluation    [place] Name 04/19/17 7096 05/10/17 1207 06/14/17 1630         Psychosocial Re-Evaluation   Current issues with  Current Depression;History of Depression  Current Depression;History of Depression  Current Depression;History of Depression     Comments  No change re: depression since orientation  No change re: depression since orientation.  Is on medication for depression and is stable at this time.  No change re: depression since orientation.  Is on medication for depression and is stable at this time.     Expected Outcomes  no barriers to participation in pulmonary rehab  no barriers to participation in pulmonary rehab  patient will remain free from psychosocial barriers to participation in pulmonary rehab program     Interventions  Encouraged to attend Pulmonary Rehabilitation for the exercise  Encouraged to attend Pulmonary Rehabilitation for the exercise  Encouraged to attend Pulmonary Rehabilitation for the exercise     Continue Psychosocial Services   Follow up required by staff  Follow up required by staff  Follow up required by staff        Psychosocial Discharge (Final Psychosocial Re-Evaluation): Psychosocial Re-Evaluation - 06/14/17 1630      Psychosocial Re-Evaluation   Current issues with  Current Depression;History of Depression    Comments  No change re: depression since orientation.  Is on medication for depression and is stable at this time.    Expected Outcomes  patient will remain free from psychosocial barriers to participation in pulmonary rehab program    Interventions  Encouraged to attend Pulmonary Rehabilitation for the exercise    Continue Psychosocial Services   Follow up required by staff       Education: Education Goals: Education classes will be provided on a weekly basis,  covering required topics. Participant will state understanding/return demonstration of topics presented.  Learning Barriers/Preferences: Learning Barriers/Preferences - 09/13/17 1612      Learning Barriers/Preferences   Learning Barriers  Sight    Learning Preferences  Computer/Internet;Written Material;Video;Verbal Instruction;Skilled Demonstration;Pictoral;Individual Instruction       Education Topics: Risk Factor Reduction:  -Group instruction that is supported by a PowerPoint presentation. Instructor discusses the definition of a risk factor, different risk factors for pulmonary disease, and how the heart and lungs work together.     Nutrition for Pulmonary Patient:  -Group instruction provided by PowerPoint slides, verbal discussion, and written materials to support subject matter. The instructor gives an explanation and review of healthy diet recommendations, which includes a discussion on weight management, recommendations for fruit and vegetable consumption, as well as protein, fluid, caffeine, fiber, sodium, sugar, and alcohol. Tips for eating when patients are short of breath are discussed.   PULMONARY REHAB CHRONIC OBSTRUCTIVE PULMONARY DISEASE from 06/03/2017 in [place]  Date  05/27/17  Educator  edna  Instruction Review Code  2- Demonstrated Understanding      Pursed Lip Breathing:  -Group  instruction that is supported by demonstration and informational handouts. Instructor discusses the benefits of pursed lip and diaphragmatic breathing and detailed demonstration on how to preform both.     Oxygen Safety:  -Group instruction provided by PowerPoint, verbal discussion, and written material to support subject matter. There is an overview of "What is Oxygen" and "Why do we need it".  Instructor also reviews how to create a safe environment for oxygen use, the importance of using oxygen as prescribed, and the risks of noncompliance. There is a  brief discussion on traveling with oxygen and resources the patient may utilize.   PULMONARY REHAB CHRONIC OBSTRUCTIVE PULMONARY DISEASE from 06/03/2017 in [place]  Date  04/15/17  Educator  RN  Instruction Review Code  2- Demonstrated Understanding      Oxygen Equipment:  -Group instruction provided by [facility] Staff utilizing handouts, written materials, and equipment demonstrations.   Signs and Symptoms:  -Group instruction provided by written material and verbal discussion to support subject matter. Warning signs and symptoms of infection, stroke, and heart attack are reviewed and when to call the physician/911 reinforced. Tips for preventing the spread of infection discussed.   Advanced Directives:  -Group instruction provided by verbal instruction and written material to support subject matter. Instructor reviews Advanced Directive laws and proper instruction for filling out document.   Pulmonary Video:  -Group video education that reviews the importance of medication and oxygen compliance, exercise, good nutrition, pulmonary hygiene, and pursed lip and diaphragmatic breathing for the pulmonary patient.   Exercise for the Pulmonary Patient:  -Group instruction that is supported by a PowerPoint presentation. Instructor discusses benefits of exercise, core components of exercise, frequency, duration, and intensity of an exercise routine, importance of utilizing pulse oximetry during exercise, safety while exercising, and options of places to exercise outside of rehab.     Pulmonary Medications:  -Verbally interactive group education provided by instructor with focus on inhaled medications and proper administration.   PULMONARY REHAB CHRONIC OBSTRUCTIVE PULMONARY DISEASE from 06/03/2017 in [place]  Date  06/03/17  Educator  Pharmacist  Instruction Review Code  1- Verbalizes Understanding      Anatomy and  Physiology of the Respiratory System and Intimacy:  -Group instruction provided by PowerPoint, verbal discussion, and written material to support subject matter. Instructor reviews respiratory cycle and anatomical components of the respiratory system and their functions. Instructor also reviews differences in obstructive and restrictive respiratory diseases with examples of each. Intimacy, Sex, and Sexuality differences are reviewed with a discussion on how relationships can change when diagnosed with pulmonary disease. Common sexual concerns are reviewed.   MD DAY -A group question and answer session with a medical doctor that allows participants to ask questions that relate to their pulmonary disease state.   OTHER EDUCATION -Group or individual verbal, written, or video instructions that support the educational goals of the pulmonary rehab program.   Holiday Eating Survival Tips:  -Group instruction provided by PowerPoint slides, verbal discussion, and written materials to support subject matter. The instructor gives patients tips, tricks, and techniques to help them not only survive but enjoy the holidays despite the onslaught of food that accompanies the holidays.   Knowledge Questionnaire Score:   Core Components/Risk Factors/Patient Goals at Admission: Personal Goals and Risk Factors at Admission - 09/13/17 1420      Core Components/Risk Factors/Patient Goals on Admission    Weight Management  Yes;Weight Loss  Intervention  Weight Management/Obesity: Establish reasonable short term and long term weight goals.;Obesity: Provide education and appropriate resources to help participant work on and attain dietary goals.;Weight Management: Provide education and appropriate resources to help participant work on and attain dietary goals.;Weight Management: Develop a combined nutrition and exercise program designed to reach desired caloric intake, while maintaining appropriate intake of  nutrient and fiber, sodium and fats, and appropriate energy expenditure required for the weight goal.    Admit Weight  270 lb 15.1 oz (122.9 kg)    Goal Weight: Short Term  260 lb (117.9 kg)    Goal Weight: Long Term  250 lb (113.4 kg)    Expected Outcomes  Long Term: Adherence to nutrition and physical activity/exercise program aimed toward attainment of established weight goal;Weight Loss: Understanding of general recommendations for a balanced deficit meal plan, which promotes 1-2 lb weight loss per week and includes a negative energy balance of [phone] kcal/d;Understanding recommendations for meals to include 15-35% energy as protein, 25-35% energy from fat, 35-60% energy from carbohydrates, less than 263m of dietary cholesterol, 20-35 gm of total fiber daily;Understanding of distribution of calorie intake throughout the day with the consumption of 4-5 meals/snacks    Improve shortness of breath with ADL's  Yes    Intervention  Provide education, individualized exercise plan and daily activity instruction to help decrease symptoms of SOB with activities of daily living.    Expected Outcomes  Short Term: Improve cardiorespiratory fitness to achieve a reduction of symptoms when performing ADLs;Long Term: Be able to perform more ADLs without symptoms or delay the onset of symptoms    Heart Failure  Yes    Intervention  Provide a combined exercise and nutrition program that is supplemented with education, support and counseling about heart failure. Directed toward relieving symptoms such as shortness of breath, decreased exercise tolerance, and extremity edema.    Expected Outcomes  Improve functional capacity of life;Short term: Attendance in program 2-3 days a week with increased exercise capacity. Reported lower sodium intake. Reported increased fruit and vegetable intake. Reports medication compliance.;Short term: Daily weights obtained and reported for increase. Utilizing diuretic protocols set by  physician.;Long term: Adoption of self-care skills and reduction of barriers for early signs and symptoms recognition and intervention leading to self-care maintenance.    Stress  Yes    Intervention  Offer individual and/or small group education and counseling on adjustment to heart disease, stress management and health-related lifestyle change. Teach and support self-help strategies.;Refer participants experiencing significant psychosocial distress to appropriate mental health specialists for further evaluation and treatment. When possible, include family members and significant others in education/counseling sessions.    Expected Outcomes  Short Term: Participant demonstrates changes in health-related behavior, relaxation and other stress management skills, ability to obtain effective social support, and compliance with psychotropic medications if prescribed.;Long Term: Emotional wellbeing is indicated by absence of clinically significant psychosocial distress or social isolation.       Core Components/Risk Factors/Patient Goals Review:  Goals and Risk Factor Review    Row Name 04/19/17 [id number] 06/14/17 1622         Core Components/Risk Factors/Patient Goals Review   Personal Goals Review  Weight Management/Obesity;Improve shortness of breath with ADL's;Develop more efficient breathing techniques such as purse lipped breathing and diaphragmatic breathing and practicing self-pacing with activity.;Stress  Weight Management/Obesity;Improve shortness of breath with ADL's;Develop more efficient breathing techniques such as purse lipped breathing and diaphragmatic breathing and practicing self-pacing with activity.;Stress  Weight Management/Obesity;Improve shortness of breath with ADL's;Develop more efficient breathing techniques such as purse lipped breathing and diaphragmatic breathing and practicing self-pacing with activity.;Stress     Review  Just started program, has attended 3 exercise  sessions, multiple health issues, chronic pain in her hips and back have limited her from walking on the track.  In hopes she can do the program to graduation, but afraid her chronic pain will drastically slow her progress.  has attended 4 exercise sessions, was hospitalized last week with influenza, will be out this week to get her strength back and will return next week.  Due to illness unable to meet admission goals.  patient continues to have medical issues that interfere with attendance to pulmonary rehab. as of last week, patient was diagnosed with shingles. she will not return this week and we will readdress on Monday of next. she has not lost any weight however she has not changed her eating habits related to other medical issues that has taken precidences. she works hard when she is in attendance and has even self increased workloads on equiptment. as of now she has not shown much progression towards pulmonary goals. expect to see better progress towards pulmonary rehab goals over the next 30 days if she is able to have consistant attendance.     Expected Outcomes  Slow progression d/t chronic pain issues.  Slow progression d/t chronic pain issues and recent hospitalization.  see "admission" expected outcomes        Core Components/Risk Factors/Patient Goals at Discharge (Final Review):  Goals and Risk Factor Review - 06/14/17 1622      Core Components/Risk Factors/Patient Goals Review   Personal Goals Review  Weight Management/Obesity;Improve shortness of breath with ADL's;Develop more efficient breathing techniques such as purse lipped breathing and diaphragmatic breathing and practicing self-pacing with activity.;Stress    Review  patient continues to have medical issues that interfere with attendance to pulmonary rehab. as of last week, patient was diagnosed with shingles. she will not return this week and we will readdress on Monday of next. she has not lost any weight however she has not  changed her eating habits related to other medical issues that has taken precidences. she works hard when she is in attendance and has even self increased workloads on equiptment. as of now she has not shown much progression towards pulmonary goals. expect to see better progress towards pulmonary rehab goals over the next 30 days if she is able to have consistant attendance.    Expected Outcomes  see "admission" expected outcomes       ITP Comments: ITP Comments    Row Name 09/13/17 1349           ITP Comments  Dr. Jennet Maduro, Medical Director          Comments:

## 2017-10-05 ENCOUNTER — Encounter (HOSPITAL_COMMUNITY)
Admission: RE | Admit: 2017-10-05 | Discharge: 2017-10-05 | Disposition: A | Discharge status: Home / Self Care | Payer: Medicare HMO | Source: Ambulatory Visit | Attending: Pulmonary Disease | Admitting: Pulmonary Disease

## 2017-10-05 ENCOUNTER — Encounter (HOSPITAL_COMMUNITY): Payer: Medicare HMO

## 2017-10-05 DIAGNOSIS — J449 Chronic obstructive pulmonary disease, unspecified: Secondary | ICD-10-CM

## 2017-10-05 DIAGNOSIS — J45902 Unspecified asthma with status asthmaticus: Secondary | ICD-10-CM

## 2017-10-05 NOTE — Progress Notes (Signed)
Daily Session Note  Patient Details  Name: Kathleen Hatfield MRN: [id number] Date of Birth: 06/15/1955 Referring Provider:     Pulmonary Rehab Walk Test from 09/28/2017 in [place]  Referring Provider  Dr. Halford Chessman      Encounter Date: 10/05/2017  Check In: Session Check In - 10/05/17 1323      Check-In   Supervising physician immediately available to respond to emergencies  [facility] immediately available    Physician(s)  Dr. Rodena Piety    Location  [facility]    Staff Present  Su Hilt, MS, ACSM RCEP, Exercise Physiologist;Carlette Wilber Oliphant, RN, Roque Cash, RN    Medication changes reported      No    Fall or balance concerns reported     No    Tobacco Cessation  No Change    Warm-up and Cool-down  Performed as group-led instruction    Resistance Training Performed  Yes    VAD Patient?  No    PAD/SET Patient?  No      Pain Assessment   Currently in Pain?  No/denies    Multiple Pain Sites  No       Capillary Blood Glucose: No results found for this or any previous visit (from the past 24 hour(s)).    Social History   Tobacco Use  Smoking Status Never Smoker  Smokeless Tobacco Never Used    Goals Met:  Exercise tolerated well No report of cardiac concerns or symptoms Strength training completed today  Goals Unmet:  Not Applicable  Comments: Service time is from 1330. to 1510    Dr. Rush Farmer is Medical Director for Pulmonary Rehab at [facility].

## 2017-10-05 NOTE — Progress Notes (Signed)
Kathleen Hatfield 62 y.o. female  DOB: November 18, 1955 MRN: [id number]           Nutrition Note 1. Stage 2 moderate COPD by GOLD classification ([place])   2. 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 Anemia   . Arthritis   . Asthma   . CHF (congestive heart failure) ([place]) 05/2014  . Chronic diastolic heart failure ([place])   . CKD (chronic kidney disease)   . Depression   . Diabetes mellitus without complication ([place])   . Diverticulitis   . Dyspnea    with exertion  . GERD (gastroesophageal reflux disease)   . History of blood transfusion   . Hyperlipidemia    cannot tolerate statins  . Hypertension   . Peripheral neuropathy   . Pneumonia   . PONV (postoperative nausea and vomiting)   . Sleep apnea    uses CPAP  . Ulcerative colitis ([place])    dr Collene Mares   Meds reviewed. Rosuvastatin, liraglutide, Vit D, lasix,  noted  Ht: Ht Readings from Last 1 Encounters:  09/13/17 5' 4.75" (1.645 m)     Wt:  Wt Readings from Last 3 Encounters:  09/13/17 270 lb 15.1 oz (122.9 kg)  07/30/17 269 lb 6.4 oz (122.2 kg)  06/03/17 273 lb 13 oz (124.2 kg)     BMI: 45.44    Current tobacco use? No     Labs:  Lipid Panel     Component Value Date/Time   CHOL 236 (H) 03/25/2013 0500   TRIG 345 (H) 03/25/2013 0500   HDL 34 (L) 03/25/2013 0500   CHOLHDL 6.9 03/25/2013 0500   VLDL 69 (H) 03/25/2013 0500   LDLCALC 133 (H) 03/25/2013 0500    Lab Results  Component Value Date   HGBA1C 8.5 (H) 05/04/2017    Nutrition Diagnosis  ? Overweight/obesity related to excessive energy intake as evidenced by a BMI of 45.44   Goal(s)  1. Pt to identify and limit food sources of sodium, saturated fat, trans fat, simple carbohydrates 2. Identify food quantities necessary to achieve wt loss of  -2# per week to a goal wt loss of 2.7-10.9 kg (6-24 lb) at graduation from pulmonary rehab. 3. CBG's in the normal range  or as close to normal as is safely possible.   Plan:  Pt to attend Pulmonary Nutrition class Will provide client-centered nutrition education as part of interdisciplinary care.    Monitor and Evaluate progress toward nutrition goal with team.   Laurina Bustle, MS, RD, LDN 10/05/2017 3:43 PM

## 2017-10-06 DIAGNOSIS — E782 Mixed hyperlipidemia: Secondary | ICD-10-CM | POA: Diagnosis not present

## 2017-10-06 DIAGNOSIS — N289 Disorder of kidney and ureter, unspecified: Secondary | ICD-10-CM | POA: Diagnosis not present

## 2017-10-07 ENCOUNTER — Encounter (HOSPITAL_COMMUNITY)
Admission: RE | Admit: 2017-10-07 | Discharge: 2017-10-07 | Disposition: A | Discharge status: Home / Self Care | Payer: Medicare HMO | Source: Ambulatory Visit | Attending: Pulmonary Disease | Admitting: Pulmonary Disease

## 2017-10-07 ENCOUNTER — Encounter (HOSPITAL_COMMUNITY): Payer: Medicare HMO

## 2017-10-07 DIAGNOSIS — J449 Chronic obstructive pulmonary disease, unspecified: Secondary | ICD-10-CM | POA: Insufficient documentation

## 2017-10-07 DIAGNOSIS — J45902 Unspecified asthma with status asthmaticus: Secondary | ICD-10-CM

## 2017-10-07 NOTE — Progress Notes (Signed)
Daily Session Note  Patient Details  Name: DENEE BOEDER MRN: [id number] Date of Birth: 1955-12-19 Referring Provider:     Pulmonary Rehab Walk Test from 09/28/2017 in [place]  Referring Provider  Dr. Halford Chessman      Encounter Date: 10/07/2017  Check In: Session Check In - 10/07/17 1347      Check-In   Supervising physician immediately available to respond to emergencies  [facility] immediately available    Physician(s)  Dr. Herbert Moors    Location  [facility]    Staff Present  Su Hilt, MS, ACSM RCEP, Exercise Physiologist;Carlette Wilber Oliphant, RN, BSN;Other    Medication changes reported      No    Fall or balance concerns reported     No    Tobacco Cessation  No Change    Warm-up and Cool-down  Performed as group-led instruction    Resistance Training Performed  Yes    VAD Patient?  No    PAD/SET Patient?  No      Pain Assessment   Currently in Pain?  No/denies       Capillary Blood Glucose: No results found for this or any previous visit (from the past 24 hour(s)).    Social History   Tobacco Use  Smoking Status Never Smoker  Smokeless Tobacco Never Used    Goals Met:  Exercise tolerated well No report of cardiac concerns or symptoms Strength training completed today  Goals Unmet:  Not Applicable  Comments: Service time is from 1:30p to 3:30p    Dr. Rush Farmer is Medical Director for Pulmonary Rehab at [facility].

## 2017-10-11 DIAGNOSIS — G4733 Obstructive sleep apnea (adult) (pediatric): Secondary | ICD-10-CM | POA: Diagnosis not present

## 2017-10-12 ENCOUNTER — Encounter (HOSPITAL_COMMUNITY): Payer: Medicare HMO

## 2017-10-12 ENCOUNTER — Encounter (HOSPITAL_COMMUNITY)
Admission: RE | Admit: 2017-10-12 | Discharge: 2017-10-12 | Disposition: A | Discharge status: Home / Self Care | Payer: Medicare HMO | Source: Ambulatory Visit

## 2017-10-12 ENCOUNTER — Encounter (HOSPITAL_COMMUNITY): Payer: Self-pay | Admitting: *Deleted

## 2017-10-12 DIAGNOSIS — J45902 Unspecified asthma with status asthmaticus: Secondary | ICD-10-CM

## 2017-10-12 DIAGNOSIS — K219 Gastro-esophageal reflux disease without esophagitis: Secondary | ICD-10-CM | POA: Diagnosis not present

## 2017-10-12 DIAGNOSIS — Z1211 Encounter for screening for malignant neoplasm of colon: Secondary | ICD-10-CM | POA: Diagnosis not present

## 2017-10-12 DIAGNOSIS — R635 Abnormal weight gain: Secondary | ICD-10-CM | POA: Diagnosis not present

## 2017-10-12 DIAGNOSIS — K5904 Chronic idiopathic constipation: Secondary | ICD-10-CM | POA: Diagnosis not present

## 2017-10-12 DIAGNOSIS — J449 Chronic obstructive pulmonary disease, unspecified: Secondary | ICD-10-CM

## 2017-10-12 DIAGNOSIS — K5730 Diverticulosis of large intestine without perforation or abscess without bleeding: Secondary | ICD-10-CM | POA: Diagnosis not present

## 2017-10-12 NOTE — Progress Notes (Signed)
Daily Session Note  Patient Details  Name: Kathleen Hatfield MRN: [id number] Date of Birth: 1955-05-06 Referring Provider:     Pulmonary Rehab Walk Test from 09/28/2017 in [place]  Referring Provider  Dr. Halford Chessman      Encounter Date: 10/12/2017  Check In: Session Check In - 10/12/17 1612      Check-In   Supervising physician immediately available to respond to emergencies  [facility] immediately available    Physician(s)  Dr. Reesa Chew    Location  [facility]    Staff Present  Su Hilt, MS, ACSM RCEP, Exercise Physiologist;Joann Rion, RN, BSN;Carlette Carlton, Therapist, sports, BSN;Ramon Dredge, RN, MHA    Medication changes reported      No    Fall or balance concerns reported     No    Tobacco Cessation  No Change    Warm-up and Cool-down  Performed as group-led instruction    Resistance Training Performed  No    VAD Patient?  No    PAD/SET Patient?  No      Pain Assessment   Currently in Pain?  No/denies       Capillary Blood Glucose: No results found for this or any previous visit (from the past 24 hour(s)).  Exercise Prescription Changes - 10/12/17 1700      Response to Exercise   Blood Pressure (Admit)  118/60    Blood Pressure (Exercise)  140/72    Blood Pressure (Exit)  112/62    Heart Rate (Admit)  90 bpm    Heart Rate (Exercise)  107 bpm    Heart Rate (Exit)  96 bpm    Oxygen Saturation (Admit)  94 %    Oxygen Saturation (Exercise)  92 %    Oxygen Saturation (Exit)  92 %    Rating of Perceived Exertion (Exercise)  13    Perceived Dyspnea (Exercise)  3    Duration  Progress to 45 minutes of aerobic exercise without signs/symptoms of physical distress    Intensity  Other (comment) 40-80% of HRR      Progression   Progression  Continue to progress workloads to maintain intensity without signs/symptoms of physical distress.      Resistance Training   Training Prescription  Yes    Weight  blue bands    Reps   10-15    Time  10 Minutes      Interval Training   Interval Training  No      NuStep   Level  2    SPM  80    Minutes  17    METs  1.8      Arm Ergometer   Level  1    Minutes  17       Social History   Tobacco Use  Smoking Status Never Smoker  Smokeless Tobacco Never Used    Goals Met:  Exercise tolerated well No report of cardiac concerns or symptoms Strength training completed today  Goals Unmet:  Not Applicable  Comments:    Service time is from 1330 to 1545        Dr. Rush Farmer is Medical Director for Pulmonary Rehab at [facility].

## 2017-10-13 ENCOUNTER — Other Ambulatory Visit: Payer: Self-pay | Admitting: Gastroenterology

## 2017-10-13 ENCOUNTER — Encounter (HOSPITAL_COMMUNITY): Payer: Self-pay | Admitting: *Deleted

## 2017-10-14 ENCOUNTER — Encounter (HOSPITAL_COMMUNITY): Payer: Medicare HMO

## 2017-10-14 ENCOUNTER — Encounter (HOSPITAL_COMMUNITY)
Admission: RE | Admit: 2017-10-14 | Discharge: 2017-10-14 | Disposition: A | Discharge status: Home / Self Care | Payer: Medicare HMO | Source: Ambulatory Visit | Attending: Pulmonary Disease | Admitting: Pulmonary Disease

## 2017-10-14 DIAGNOSIS — J449 Chronic obstructive pulmonary disease, unspecified: Secondary | ICD-10-CM

## 2017-10-14 NOTE — Progress Notes (Signed)
Daily Session Note  Patient Details  Name: Kathleen Hatfield MRN: [id number] Date of Birth: 1955-07-18 Referring Provider:     Pulmonary Rehab Walk Test from 09/28/2017 in [place]  Referring Provider  Dr. Halford Chessman      Encounter Date: 10/14/2017  Check In: Session Check In - 10/14/17 1527      Check-In   Physician(s)  Dr. Gilda Crease Blood Glucose: No results found for this or any previous visit (from the past 24 hour(s)).    Social History   Tobacco Use  Smoking Status Never Smoker  Smokeless Tobacco Never Used    Goals Met:  Exercise tolerated well No report of cardiac concerns or symptoms Strength training completed today  Goals Unmet:  Not Applicable  Comments: Service time is from 1330 to 1515    Dr. Rush Farmer is Medical Director for Pulmonary Rehab at [facility].

## 2017-10-19 ENCOUNTER — Encounter (HOSPITAL_COMMUNITY)
Admission: RE | Admit: 2017-10-19 | Discharge: 2017-10-19 | Disposition: A | Discharge status: Home / Self Care | Payer: Medicare HMO | Source: Ambulatory Visit | Attending: Pulmonary Disease | Admitting: Pulmonary Disease

## 2017-10-19 ENCOUNTER — Encounter (HOSPITAL_COMMUNITY): Payer: Medicare HMO

## 2017-10-19 DIAGNOSIS — J449 Chronic obstructive pulmonary disease, unspecified: Secondary | ICD-10-CM

## 2017-10-19 DIAGNOSIS — J45902 Unspecified asthma with status asthmaticus: Secondary | ICD-10-CM

## 2017-10-19 NOTE — Progress Notes (Signed)
Daily Session Note  Patient Details  Name: Kathleen Hatfield MRN: [id number] Date of Birth: Mar 04, 1956 Referring Provider:     Pulmonary Rehab Walk Test from 09/28/2017 in [place]  Referring Provider  Dr. Halford Chessman      Encounter Date: 10/19/2017  Check In: Session Check In - 10/19/17 1518      Check-In   Supervising physician immediately available to respond to emergencies  [facility] immediately available    Physician(s)  Dr. Florene Glen    Location  [facility]    Staff Present  Maurice Small, RN, BSN;Molly DiVincenzo, MS, ACSM RCEP, Exercise Physiologist;Ladarren Steiner Ysidro Evert, RN    Medication changes reported      No    Fall or balance concerns reported     No    Tobacco Cessation  No Change    Warm-up and Cool-down  Performed as group-led instruction    Resistance Training Performed  Yes    VAD Patient?  No    PAD/SET Patient?  No      Pain Assessment   Currently in Pain?  No/denies    Multiple Pain Sites  No       Capillary Blood Glucose: No results found for this or any previous visit (from the past 24 hour(s)).    Social History   Tobacco Use  Smoking Status Never Smoker  Smokeless Tobacco Never Used    Goals Met:  Exercise tolerated well No report of cardiac concerns or symptoms Strength training completed today  Goals Unmet:  Not Applicable  Comments: Service time is from 1330 to 1500    Dr. Rush Farmer is Medical Director for Pulmonary Rehab at [facility].

## 2017-10-20 NOTE — Progress Notes (Signed)
CLAUDE SWENDSEN 62 y.o. female   DOB: [birth year] MRN: [id number]          Nutrition 1. 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 Stage 2 moderate COPD by GOLD classification [facility])    Past Medical History:  Diagnosis Date  . Anemia   . Arthritis   . Asthma   . CHF (congestive heart failure) ([place]) 05/2014  . Chronic diastolic heart failure ([place])   . CKD (chronic kidney disease)   . Depression   . Diabetes mellitus without complication ([place])   . Diverticulitis   . Dyspnea    with exertion  . GERD (gastroesophageal reflux disease)   . History of blood transfusion   . Hyperlipidemia    cannot tolerate statins  . Hypertension   . Peripheral neuropathy   . Pneumonia   . PONV (postoperative nausea and vomiting)   . Sleep apnea    uses CPAP  . Ulcerative colitis ([place])    dr Collene Mares   Meds reviewed. Rosuvastatin, liraglutide, Vit D, lasix noted  Ht: Ht Readings from Last 1 Encounters:  09/13/17 5' 4.75" (1.645 m)     Wt:  Wt Readings from Last 3 Encounters:  10/12/17 275 lb 2.2 oz (124.8 kg)  09/13/17 270 lb 15.1 oz (122.9 kg)  07/30/17 269 lb 6.4 oz (122.2 kg)     BMI: 46.14    Current tobacco use? no  Labs:  Lipid Panel     Component Value Date/Time   CHOL 236 (H) 03/25/2013 0500   TRIG 345 (H) 03/25/2013 0500   HDL 34 (L) 03/25/2013 0500   CHOLHDL 6.9 03/25/2013 0500   VLDL 69 (H) 03/25/2013 0500   LDLCALC 133 (H) 03/25/2013 0500    Lab Results  Component Value Date   HGBA1C 8.5 (H) 05/04/2017   Note Spoke with pt. Pt is obese.  Pt eats 3 meals a day; most prepared at home.  Making healthy food choices the majority of the time. Pt's Rate Your Plate results reviewed with pt. Pt does not avoid salty food; uses canned/ convenience foods often.  Pt does not add salt to food.  The role of sodium in lung disease reviewed with pt. Pt expressed understanding of the information reviewed.   Pt is diabetic.  Pt  checks CBG's 1-2 times a day.  CBG's reportedly  150-160 mg/dL before meals. Pt reports Pre-exercise CBG's have been  200-230 mg/dL and post-exercise CBG's have been 180-190  mg/dL; most CBG's above desired goal.  Pt expressed understanding of the information reviewed via feedback method.    Nutrition Diagnosis  Overweight/obesity related to excessive energy intake as evidenced by a BMI of 45.44  Nutrition Intervention ? Pt's individual nutrition plan and goals reviewed with pt. ? Benefits of adopting healthy eating habits discussed when pt's Rate Your Plate reviewed.  Goal(s) 1. Pt to identify and limit food sources of sodium, saturated fat, trans fat, simple carbohydrates 2. Identify food quantities necessary to achieve wt loss of  -2# per week to a goal wt loss of 2.7-10.9 kg (6-24 lb) at graduation from pulmonary rehab. 3. CBG's in the normal range or as close to normal as is safely possible.  Plan:  Pt to attend Pulmonary Nutrition class Will provide client-centered nutrition education as part of interdisciplinary care.    Monitor and Evaluate progress toward nutrition goal with team.   Laurina Bustle, MS, RD, LDN 10/20/2017 9:31 AM

## 2017-10-21 ENCOUNTER — Encounter (HOSPITAL_COMMUNITY): Payer: Medicare HMO

## 2017-10-21 ENCOUNTER — Encounter (HOSPITAL_COMMUNITY)
Admission: RE | Admit: 2017-10-21 | Discharge: 2017-10-21 | Disposition: A | Discharge status: Home / Self Care | Payer: Medicare HMO | Source: Ambulatory Visit | Attending: Pulmonary Disease | Admitting: Pulmonary Disease

## 2017-10-21 DIAGNOSIS — J449 Chronic obstructive pulmonary disease, unspecified: Secondary | ICD-10-CM

## 2017-10-21 DIAGNOSIS — J45902 Unspecified asthma with status asthmaticus: Secondary | ICD-10-CM

## 2017-10-21 NOTE — Progress Notes (Signed)
Daily Session Note  Patient Details  Name: PHILAMENA KRAMAR MRN: [id number] Date of Birth: 06/04/1955 Referring Provider:     Pulmonary Rehab Walk Test from 09/28/2017 in [facility]  Referring Provider  Dr. Halford Chessman      Encounter Date: 10/21/2017  Check In: Session Check In - 10/21/17 1347      Check-In   Supervising physician immediately available to respond to emergencies  [facility] immediately available    Physician(s)  Dr. Florene Glen    Location  [facility]    Staff Present  Maurice Small, RN, BSN;Purcell Jungbluth, MS, ACSM RCEP, Exercise Physiologist;Annedrea Stackhouse, RN, [place]    Medication changes reported      No    Fall or balance concerns reported     No    Tobacco Cessation  No Change    Warm-up and Cool-down  Performed as group-led instruction    Resistance Training Performed  Yes    VAD Patient?  No    PAD/SET Patient?  No      Pain Assessment   Multiple Pain Sites  No       Capillary Blood Glucose: No results found for this or any previous visit (from the past 24 hour(s)).    Social History   Tobacco Use  Smoking Status Never Smoker  Smokeless Tobacco Never Used    Goals Met:  Exercise tolerated well Personal goals reviewed  Goals Unmet:  Not Applicable  Comments: Service time is from 1:30p to 3:30p    Dr. Rush Farmer is Medical Director for Pulmonary Rehab at [facility].

## 2017-10-26 ENCOUNTER — Encounter (HOSPITAL_COMMUNITY): Payer: Medicare HMO

## 2017-10-26 ENCOUNTER — Encounter (HOSPITAL_COMMUNITY)
Admission: RE | Admit: 2017-10-26 | Discharge: 2017-10-26 | Disposition: A | Discharge status: Home / Self Care | Payer: Medicare HMO | Source: Ambulatory Visit | Attending: Pulmonary Disease | Admitting: Pulmonary Disease

## 2017-10-26 DIAGNOSIS — J449 Chronic obstructive pulmonary disease, unspecified: Secondary | ICD-10-CM | POA: Diagnosis not present

## 2017-10-26 DIAGNOSIS — J45902 Unspecified asthma with status asthmaticus: Secondary | ICD-10-CM

## 2017-10-26 NOTE — Progress Notes (Signed)
Daily Session Note  Patient Details  Name: Kathleen Hatfield MRN: [id number] Date of Birth: 02/13/1956 Referring Provider:     Pulmonary Rehab Walk Test from 09/28/2017 in [facility]  Referring Provider  Dr. Sood      Encounter Date: 10/26/2017  Check In: Session Check In - 10/26/17 1352      Check-In   Supervising physician immediately available to respond to emergencies  [facility] immediately available    Physician(s)  Dr. Powell    Location  [facility]    Staff Present  Carlette Carlton, RN, BSN; , MS, ACSM RCEP, Exercise Physiologist;Annedrea Stackhouse, RN, MHA;Olinty Richards, MS, ACSM CEP, Exercise Physiologist    Medication changes reported      No    Fall or balance concerns reported     No    Tobacco Cessation  No Change    Warm-up and Cool-down  Performed as group-led instruction    Resistance Training Performed  Yes    VAD Patient?  No    PAD/SET Patient?  No      Pain Assessment   Currently in Pain?  No/denies       Capillary Blood Glucose: No results found for this or any previous visit (from the past 24 hour(s)).  Exercise Prescription Changes - 10/26/17 1600      Response to Exercise   Blood Pressure (Admit)  106/68    Blood Pressure (Exercise)  116/60    Blood Pressure (Exit)  108/60    Heart Rate (Admit)  80 bpm    Heart Rate (Exercise)  103 bpm    Heart Rate (Exit)  87 bpm    Oxygen Saturation (Admit)  90 %    Oxygen Saturation (Exercise)  91 %    Oxygen Saturation (Exit)  92 %    Rating of Perceived Exertion (Exercise)  15    Perceived Dyspnea (Exercise)  2    Duration  Progress to 45 minutes of aerobic exercise without signs/symptoms of physical distress    Intensity  Other (comment)   40-80% of HRR     Progression   Progression  Continue to progress workloads to maintain intensity without signs/symptoms of physical distress.      Resistance Training   Training Prescription   Yes    Weight  blue bands    Reps  10-15    Time  10 Minutes      Interval Training   Interval Training  No      NuStep   Level  4    SPM  80    Minutes  45    METs  1.7       Social History   Tobacco Use  Smoking Status Never Smoker  Smokeless Tobacco Never Used    Goals Met:  Exercise tolerated well  Goals Unmet:  Not Applicable  Comments: Service time is from 1:30P to 3:30P    Dr. Wesam G. Yacoub is Medical Director for Pulmonary Rehab at [facility]. 

## 2017-10-26 NOTE — Progress Notes (Signed)
Pulmonary Individual Treatment Plan  Patient Details  Name: CHANICE BRENTON MRN: [id number] Date of Birth: 03/01/1956 Referring Provider:     Pulmonary Rehab Walk Test from 09/28/2017 in [facility]  Referring Provider  Dr. 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  Aclidinium Bromide (TUDORZA PRESSAIR) 400 MCG/ACT AEPB, Inhale 1 puff into the lungs 2 (two) times daily., Disp: 1 each, Rfl: 5 .  albuterol (PROAIR HFA) 108 (90 Base) MCG/ACT inhaler, INHALE 2 PUFFS INTO THE LUNGS EVERY FOUR HOURS AS NEEDED FOR WHEEZING., Disp: 8.5 g, Rfl: 5 .  BAYER CONTOUR NEXT TEST test strip, As directed, Disp: , Rfl:  .  budesonide-formoterol (SYMBICORT) 160-4.5 MCG/ACT inhaler, Inhale 2 puffs into the lungs 2 (two) times daily., Disp: 1 Inhaler, Rfl: 5 .  Cholecalciferol (VITAMIN D-3) 5000 UNITS TABS, Take 5,000 Units by mouth daily. , Disp: , Rfl:  .  DULoxetine (CYMBALTA) 60 MG capsule, Take 60 mg by mouth daily. , Disp: , Rfl:  .  fenofibrate (TRICOR) 145 MG tablet, Take 145 mg by mouth at bedtime. , Disp: , Rfl: 11 .  furosemide (LASIX) 40 MG tablet, Take 40 mg by mouth daily. 09/13/17  Pt takes 1 1/2 daily, Disp: , Rfl:  .  gabapentin (NEURONTIN) 100 MG capsule, Take 200-400 mg by mouth See admin instructions. Take 3 capsules at bedtime, Disp: , Rfl:  .  GLOBAL EASE INJECT PEN NEEDLES 32G X 4 MM MISC, , Disp: , Rfl:  .  guaiFENesin-dextromethorphan (ROBITUSSIN DM) 100-10 MG/5ML syrup, Take 5 mLs by mouth every 6 (six) hours as needed for cough., Disp: 118 mL, Rfl: 0 .  Insulin Degludec (TRESIBA FLEXTOUCH) 200 UNIT/ML SOPN, Inject  74 Units into the skin daily., Disp: , Rfl:  .  Liraglutide (VICTOZA) 18 MG/3ML SOPN, Inject 1.8 mg into the skin daily with lunch. , Disp: , Rfl:  .  lisinopril (PRINIVIL,ZESTRIL) 10 MG tablet, Take 10 mg by mouth daily., Disp: , Rfl:  .  metolazone (ZAROXOLYN) 5 MG tablet, Take 5 mg by mouth daily as needed (Take with Lasix when weight is elevated 3 pounds overnight and 5 pounds or more for 2 days)., Disp: , Rfl:  .  MICROLET LANCETS MISC, As directed, Disp: , Rfl:  .  pantoprazole (PROTONIX) 40 MG tablet, Take 40 mg by mouth daily. , Disp: , Rfl:  .  potassium chloride (K-DUR,KLOR-CON) 10 MEQ tablet, Take 10 mEq by mouth 2 (two) times daily., Disp: , Rfl:  .  rOPINIRole (REQUIP) 4 MG tablet, Take 4 mg by mouth 2 (two) times daily. 7/8 Pt take 1 in the morning and 1 1/2 mg at bedtime, Disp: , Rfl:  .  rosuvastatin (CRESTOR) 10 MG tablet, Take 10 mg by mouth once a week., Disp: , Rfl:  .  UNABLE TO FIND, CPAP at bedtime, Disp: , Rfl:   Past Medical History: Past Medical History:  Diagnosis Date  . Anemia   . Arthritis   . Asthma   . CHF (congestive heart failure) ([place]) 05/2014  . Chronic diastolic heart failure ([place])   . CKD (chronic kidney disease)   .  Depression   . Diabetes mellitus without complication ([place])   . Diverticulitis   . Dyspnea    with exertion  . GERD (gastroesophageal reflux disease)   . History of blood transfusion   . Hyperlipidemia    cannot tolerate statins  . Hypertension   . Peripheral neuropathy   . Pneumonia   . PONV (postoperative nausea and vomiting)   . Sleep apnea    uses CPAP  . Ulcerative colitis ([place])    dr Collene Mares    Tobacco Use: Social History   Tobacco Use  Smoking Status Never Smoker  Smokeless Tobacco Never Used    Labs: Recent Review Flowsheet Data    Labs for ITP Cardiac and Pulmonary Rehab Latest Ref Rng & Units 10/21/2015 10/22/2015 04/02/2016 04/03/2016 05/04/2017   Cholestrol 0 - 200 mg/dL - - - - -   LDLCALC 0 - 99 mg/dL - - - -  -   HDL >39 mg/dL - - - - -   Trlycerides <150 mg/dL - - - - -   Hemoglobin A1c 4.8 - 5.6 % 7.4(H) - 7.6(H) - 8.5(H)   PHART 7.350 - 7.450 - 7.438 - 7.456(H) -   PCO2ART 32.0 - 48.0 mmHg - 41.0 - 45.2 -   HCO3 20.0 - 28.0 mmol/L - 27.3(H) - 31.4(H) -   TCO2 0 - 100 mmol/L - 25.1 - - -   O2SAT % - 95.7 - 90.6 -      Capillary Blood Glucose: Lab Results  Component Value Date   GLUCAP 204 (H) 05/06/2017   GLUCAP 266 (H) 05/06/2017   GLUCAP 221 (H) 05/05/2017   GLUCAP 211 (H) 05/05/2017   GLUCAP 291 (H) 05/05/2017   POCT Glucose    Row Name 05/27/17 1605 06/03/17 1630 10/12/17 1734 10/26/17 1613       POCT Blood Glucose   Pre-Exercise  230 mg/dL  225 mg/dL  251 mg/dL  158 mg/dL    Post-Exercise  206 mg/dL  140 mg/dL  172 mg/dL  141 mg/dL       Pulmonary Assessment Scores: Pulmonary Assessment Scores    Row Name 09/30/17 0734 10/13/17 1407       ADL UCSD   ADL Phase  Entry  Entry    SOB Score total  -  94      CAT Score   CAT Score  -  30      mMRC Score   mMRC Score  4  -       Pulmonary Function Assessment:   Exercise Target Goals: Exercise Program Goal: Individual exercise prescription set using results from initial 6 min walk test and THRR while considering  patient's activity barriers and safety.   Exercise Prescription Goal: Initial exercise prescription builds to 30-45 minutes a day of aerobic activity, 2-3 days per week.  Home exercise guidelines will be given to patient during program as part of exercise prescription that the participant will acknowledge.  Activity Barriers & Risk Stratification: Activity Barriers & Cardiac Risk Stratification - 09/13/17 1612      Activity Barriers & Cardiac Risk Stratification   Activity Barriers  Joint Problems;Back Problems;Shortness of Breath;History of Falls;Balance Concerns;Arthritis       6 Minute Walk: 6 Minute Walk    Row Name 09/30/17 0730         6 Minute Walk   Phase  Initial     Distance  631  feet     Walk Time  - 4 minutes  25 seconds     # of Rest Breaks  2 1 minute 35 seconds total     MPH  1.19     METS  1.92     RPE  15     Perceived Dyspnea   3     Symptoms  Yes (comment)     Comments  9/10 sciatica pain     Resting HR  90 bpm     Resting BP  118/60     Resting Oxygen Saturation   90 %     Exercise Oxygen Saturation  during 6 min walk  90 %     Max Ex. HR  107 bpm     Max Ex. BP  140/58       Interval HR   1 Minute HR  106     2 Minute HR  107     3 Minute HR  106     4 Minute HR  104     5 Minute HR  104     6 Minute HR  104     2 Minute Post HR  95     Interval Heart Rate?  Yes       Interval Oxygen   Interval Oxygen?  Yes     Baseline Oxygen Saturation %  90 %     1 Minute Oxygen Saturation %  91 %     1 Minute Liters of Oxygen  0 L     2 Minute Oxygen Saturation %  92 %     2 Minute Liters of Oxygen  0 L     3 Minute Oxygen Saturation %  93 %     3 Minute Liters of Oxygen  0 L     4 Minute Oxygen Saturation %  95 %     4 Minute Liters of Oxygen  0 L     5 Minute Oxygen Saturation %  95 %     5 Minute Liters of Oxygen  0 L     6 Minute Oxygen Saturation %  95 %     6 Minute Liters of Oxygen  0 L     2 Minute Post Oxygen Saturation %  95 %     2 Minute Post Liters of Oxygen  0 L        Oxygen Initial Assessment: Oxygen Initial Assessment - 09/30/17 0730      Initial 6 min Walk   Oxygen Used  None      Program Oxygen Prescription   Program Oxygen Prescription  None       Oxygen Re-Evaluation: Oxygen Re-Evaluation    Row Name 05/17/17 0904 06/11/17 1517 10/26/17 0643         Program Oxygen Prescription   Program Oxygen Prescription  None  None  None       Home Oxygen   Home Oxygen Device  None  None  None     Sleep Oxygen Prescription  CPAP  CPAP  CPAP     Home Exercise Oxygen Prescription  None  None  None     Home at Rest Exercise Oxygen Prescription  None  None  None     Compliance with Home Oxygen Use  Yes  Yes  Yes        Goals/Expected Outcomes   Goals/Expected Outcomes  -  maintain compliance with CPAP  maintain compliance with CPAP        Oxygen Discharge (  Final Oxygen Re-Evaluation): Oxygen Re-Evaluation - 10/26/17 0643      Program Oxygen Prescription   Program Oxygen Prescription  None      Home Oxygen   Home Oxygen Device  None    Sleep Oxygen Prescription  CPAP    Home Exercise Oxygen Prescription  None    Home at Rest Exercise Oxygen Prescription  None    Compliance with Home Oxygen Use  Yes      Goals/Expected Outcomes   Goals/Expected Outcomes  maintain compliance with CPAP       Initial Exercise Prescription: Initial Exercise Prescription - 09/30/17 0700      Date of Initial Exercise RX and Referring Provider   Date  09/28/17    Referring Provider  Dr. Halford Chessman      NuStep   Level  3    SPM  80    Minutes  17    METs  1.5      Arm Ergometer   Level  1    Watts  10    Minutes  17      Track   Laps  4    Minutes  17      Prescription Details   Frequency (times per week)  2    Duration  Progress to 45 minutes of aerobic exercise without signs/symptoms of physical distress      Intensity   THRR 40-80% of Max Heartrate  63-126    Ratings of Perceived Exertion  11-13    Perceived Dyspnea  0-4      Progression   Progression  Continue progressive overload as per policy without signs/symptoms or physical distress.      Resistance Training   Training Prescription  Yes    Weight  blue bands    Reps  10-15       Perform Capillary Blood Glucose checks as needed.  Exercise Prescription Changes:  Exercise Prescription Changes    Row Name 05/27/17 1600 06/03/17 1631 10/12/17 1700 10/26/17 1600       Response to Exercise   Blood Pressure (Admit)  136/66  124/76  118/60  106/68    Blood Pressure (Exercise)  140/70  150/60  140/72  116/60    Blood Pressure (Exit)  120/60  110/60  112/62  108/60    Heart Rate (Admit)  102 bpm  89 bpm  90 bpm  80 bpm    Heart Rate  (Exercise)  105 bpm  107 bpm  107 bpm  103 bpm    Heart Rate (Exit)  96 bpm  95 bpm  96 bpm  87 bpm    Oxygen Saturation (Admit)  92 %  93 %  94 %  90 %    Oxygen Saturation (Exercise)  95 %  95 %  92 %  91 %    Oxygen Saturation (Exit)  96 %  95 %  92 %  92 %    Rating of Perceived Exertion (Exercise)  13  13  13  15     Perceived Dyspnea (Exercise)  3  3  3  2     Duration  Progress to 45 minutes of aerobic exercise without signs/symptoms of physical distress  Progress to 45 minutes of aerobic exercise without signs/symptoms of physical distress  Progress to 45 minutes of aerobic exercise without signs/symptoms of physical distress  Progress to 45 minutes of aerobic exercise without signs/symptoms of physical distress    Intensity  THRR unchanged  THRR  unchanged  Other (comment) 40-80% of HRR  Other (comment) 40-80% of HRR      Progression   Progression  Continue to progress workloads to maintain intensity without signs/symptoms of physical distress.  Continue to progress workloads to maintain intensity without signs/symptoms of physical distress.  Continue to progress workloads to maintain intensity without signs/symptoms of physical distress.  Continue to progress workloads to maintain intensity without signs/symptoms of physical distress.      Resistance Training   Training Prescription  Yes  Yes  Yes  Yes    Weight  orange bands  orange bands  blue bands  blue bands    Reps  10-15  10-15  10-15  10-15    Time  10 Minutes  10 Minutes  10 Minutes  10 Minutes      Interval Training   Interval Training  -  -  No  No      NuStep   Level  4  5  2  4     SPM  -  80  80  80    Minutes  34  27  17  45    METs  2  2.2  1.8  1.7      Arm Ergometer   Level  -  -  1  -    Minutes  -  -  17  -      Home Exercise Plan   Plans to continue exercise at  Home (comment)  -  -  -    Frequency  Add 2 additional days to program exercise sessions.  -  -  -       Exercise Comments:  Exercise  Comments    Row Name 05/28/17 0704           Exercise Comments  Home exercise completed          Exercise Goals and Review:  Exercise Goals    Row Name 09/13/17 1436             Exercise Goals   Increase Physical Activity  Yes       Intervention  Provide advice, education, support and counseling about physical activity/exercise needs.;Develop an individualized exercise prescription for aerobic and resistive training based on initial evaluation findings, risk stratification, comorbidities and participant's personal goals.       Expected Outcomes  Short Term: Attend rehab on a regular basis to increase amount of physical activity.;Long Term: Add in home exercise to make exercise part of routine and to increase amount of physical activity.;Long Term: Exercising regularly at least 3-5 days a week.       Increase Strength and Stamina  Yes       Intervention  Provide advice, education, support and counseling about physical activity/exercise needs.;Develop an individualized exercise prescription for aerobic and resistive training based on initial evaluation findings, risk stratification, comorbidities and participant's personal goals.       Expected Outcomes  Short Term: Increase workloads from initial exercise prescription for resistance, speed, and METs.;Short Term: Perform resistance training exercises routinely during rehab and add in resistance training at home;Long Term: Improve cardiorespiratory fitness, muscular endurance and strength as measured by increased METs and functional capacity (6MWT)       Able to understand and use rate of perceived exertion (RPE) scale  Yes       Intervention  Provide education and explanation on how to use RPE scale       Expected Outcomes  Short Term: Able to use RPE daily in rehab to express subjective intensity level;Long Term:  Able to use RPE to guide intensity level when exercising independently       Able to understand and use Dyspnea scale  Yes        Intervention  Provide education and explanation on how to use Dyspnea scale       Expected Outcomes  Short Term: Able to use Dyspnea scale daily in rehab to express subjective sense of shortness of breath during exertion;Long Term: Able to use Dyspnea scale to guide intensity level when exercising independently       Knowledge and understanding of Target Heart Rate Range (THRR)  Yes       Intervention  Provide education and explanation of THRR including how the numbers were predicted and where they are located for reference       Expected Outcomes  Short Term: Able to state/look up THRR;Short Term: Able to use daily as guideline for intensity in rehab;Long Term: Able to use THRR to govern intensity when exercising independently       Intervention  Provide education, explanation, and written materials on patient's individual exercise prescription       Expected Outcomes  Short Term: Able to explain program exercise prescription;Long Term: Able to explain home exercise prescription to exercise independently          Exercise Goals Re-Evaluation : Exercise Goals Re-Evaluation    Row Name 05/17/17 0904 06/11/17 1517 10/26/17 0643         Exercise Goal Re-Evaluation   Exercise Goals Review  Increase Strength and Stamina;Able to understand and use Dyspnea scale;Able to understand and use rate of perceived exertion (RPE) scale;Increase Physical Activity;Knowledge and understanding of Target Heart Rate Range (THRR);Understanding of Exercise Prescription  Increase Strength and Stamina;Able to understand and use Dyspnea scale;Able to understand and use rate of perceived exertion (RPE) scale;Increase Physical Activity;Knowledge and understanding of Target Heart Rate Range (THRR);Understanding of Exercise Prescription  Increase Strength and Stamina;Able to understand and use Dyspnea scale;Able to understand and use rate of perceived exertion (RPE) scale;Increase Physical Activity;Knowledge and understanding  of Target Heart Rate Range (THRR);Understanding of Exercise Prescription     Comments  Patient has only attended 4 rehab sessions. Has been out with flu since 04/20/17. Will cont to monitor and progress as tolerated once she returns to rehab.   The patient has been motivated to make changes. However, now she is out of rehab due to the shingles. Patient is able to walk up to 9 laps (200 ft) in 15 minutes. Will cont. to monitor and progress as able.  Patient has had a slow start to rehab. Very tearful. Somedays patient can only do non-weight bearing exercise due to pain. Her MET level places her in a low level. Will cont. to monitor and progress as able.      Expected Outcomes  Through exercise at rehab and at home, patient will increase strength and stamina making ADL's easier to perform. Patient will also have a better understanding of safe exercise and what they are capable to do outside of clinical supervision.  Through exercise at rehab and at home, patient will increase strength and stamina making ADL's easier to perform. Patient will also have a better understanding of safe exercise and what they are capable to do outside of clinical supervision.  Through exercise at rehab and at home, patient will increase strength and stamina making ADL's easier to perform. Patient will  also have a better understanding of safe exercise and what they are capable to do outside of clinical supervision.        Discharge Exercise Prescription (Final Exercise Prescription Changes): Exercise Prescription Changes - 10/26/17 1600      Response to Exercise   Blood Pressure (Admit)  106/68    Blood Pressure (Exercise)  116/60    Blood Pressure (Exit)  108/60    Heart Rate (Admit)  80 bpm    Heart Rate (Exercise)  103 bpm    Heart Rate (Exit)  87 bpm    Oxygen Saturation (Admit)  90 %    Oxygen Saturation (Exercise)  91 %    Oxygen Saturation (Exit)  92 %    Rating of Perceived Exertion (Exercise)  15    Perceived  Dyspnea (Exercise)  2    Duration  Progress to 45 minutes of aerobic exercise without signs/symptoms of physical distress    Intensity  Other (comment)   40-80% of HRR     Progression   Progression  Continue to progress workloads to maintain intensity without signs/symptoms of physical distress.      Resistance Training   Training Prescription  Yes    Weight  blue bands    Reps  10-15    Time  10 Minutes      Interval Training   Interval Training  No      NuStep   Level  4    SPM  80    Minutes  45    METs  1.7       Nutrition:  Target Goals: Understanding of nutrition guidelines, daily intake of sodium <1573m, cholesterol <2017m calories 30% from fat and 7% or less from saturated fats, daily to have 5 or more servings of fruits and vegetables.  Biometrics:    Nutrition Therapy Plan and Nutrition Goals: Nutrition Therapy & Goals - 10/05/17 1551      Nutrition Therapy   Diet  Carb Modified, Heart Healthy      Personal Nutrition Goals   Nutrition Goal  Identify food quantities necessary to achieve wt loss of  -2# per week to a goal wt loss of 6-24 lb at graduation from pulmonary rehab.    Personal Goal #2  personal goal for CBG's in the normal range not met, as identified by CBG's pre and post exercise      Intervention Plan   Intervention  Prescribe, educate and counsel regarding individualized specific dietary modifications aiming towards targeted core components such as weight, hypertension, lipid management, diabetes, heart failure and other comorbidities.    Expected Outcomes  Short Term Goal: Understand basic principles of dietary content, such as calories, fat, sodium, cholesterol and nutrients.;Long Term Goal: Adherence to prescribed nutrition plan.       Nutrition Assessments: Nutrition Assessments - 10/05/17 1556      Rate Your Plate Scores   Pre Score  43       Nutrition Goals Re-Evaluation: Nutrition Goals Re-Evaluation    Row Name 08/20/17 1016  08/20/17 1019 10/05/17 1551         Goals   Current Weight  273 lb 13 oz (124.2 kg)  273 lb 13 oz (124.2 kg)  270 lb (122.5 kg)     Nutrition Goal  Identify food quantities necessary to achieve wt loss of  -2# per week to a goal wt loss of 6-24 lb at graduation from pulmonary rehab.  Identify food quantities necessary to achieve wt  loss of  -2# per week to a goal wt loss of 6-24 lb at graduation from pulmonary rehab.  -     Comment  pt maintained weight, weight loss goal not met  pt maintained weight, weight loss goal not met  -       Personal Goal #2 Re-Evaluation   Personal Goal #2  CBG's in the normal range or as close to normal as is safely possible.  personal goal for CBG's in the normal range not met, as identified by CBG's pre and post exercise  -        Nutrition Goals Discharge (Final Nutrition Goals Re-Evaluation): Nutrition Goals Re-Evaluation - 10/05/17 1551      Goals   Current Weight  270 lb (122.5 kg)       Psychosocial: Target Goals: Acknowledge presence or absence of significant depression and/or stress, maximize coping skills, provide positive support system. Participant is able to verbalize types and ability to use techniques and skills needed for reducing stress and depression.  Initial Review & Psychosocial Screening: Initial Psych Review & Screening - 09/13/17 1444      Initial Review   Current issues with  Current Depression;Current Stress Concerns    Source of Stress Concerns  Unable to participate in former interests or hobbies;Chronic Illness;Unable to perform yard/household activities      [place]?  Yes      Barriers   Psychosocial barriers to participate in program  The patient should benefit from training in stress management and relaxation.      Screening Interventions   Interventions  Encouraged to exercise    Expected Outcomes  Long Term Goal: Stressors or current issues are controlled or eliminated.;Short Term  goal: Identification and review with participant of any Quality of Life or Depression concerns found by scoring the questionnaire.;Long Term goal: The participant improves quality of Life and PHQ9 Scores as seen by post scores and/or verbalization of changes       Quality of Life Scores:  Scores of 19 and below usually indicate a poorer quality of life in these areas.  A difference of  2-3 points is a clinically meaningful difference.  A difference of 2-3 points in the total score of the Quality of Life Index has been associated with significant improvement in overall quality of life, self-image, physical symptoms, and general health in studies assessing change in quality of life.  PHQ-9: Recent Review Flowsheet Data    Depression screen [facility] 2/9 09/13/2017 03/29/2017   Decreased Interest 3 3   Down, Depressed, Hopeless 3 2   PHQ - 2 Score 6 5   Altered sleeping 3 3   Tired, decreased energy 3 3   Change in appetite 1 1   Feeling bad or failure about yourself  3 3   Trouble concentrating 3 3   Moving slowly or fidgety/restless 3 2   Suicidal thoughts 0 0   PHQ-9 Score 22 20   Difficult doing work/chores Very difficult Extremely dIfficult     Interpretation of Total Score  Total Score Depression Severity:  1-4 = Minimal depression, 5-9 = Mild depression, 10-14 = Moderate depression, 15-19 = Moderately severe depression, 20-27 = Severe depression   Psychosocial Evaluation and Intervention: Psychosocial Evaluation - 10/26/17 0848      Psychosocial Evaluation & Interventions   Interventions  Encouraged to exercise with the program and follow exercise prescription    Comments  Pt had a change in  her medications for depression.  Pt cymbalta decreased to 60 mg daily.  Pt notes that she is more tearfull and this is observed during the orientation intake.  This is managed by her PCP     Expected Outcomes  Pt will report less to none tearful episodes.  Less dependency on others to help with ADL'  such as cooking, house cleaning and laundry. Pt will have have positive outlook and healthy coping skills.     Continue Psychosocial Services   Follow up required by staff       Psychosocial Re-Evaluation: Psychosocial Re-Evaluation    [place] Name 05/10/17 1207 06/14/17 1630 10/26/17 0848         Psychosocial Re-Evaluation   Current issues with  Current Depression;History of Depression  Current Depression;History of Depression  Current Depression;History of Depression     Comments  No change re: depression since orientation.  Is on medication for depression and is stable at this time.  No change re: depression since orientation.  Is on medication for depression and is stable at this time.  No change re: depression since orientation.  Pt PCP continues to tweak pt medications.  This has been difficult for pt.  Pt reports mood swings and tearful episodes over very little issues.  Pt attended Meditation and mindfulness education class.     Expected Outcomes  no barriers to participation in pulmonary rehab  patient will remain free from psychosocial barriers to participation in pulmonary rehab program  patient will remain free from psychosocial barriers to participation in pulmonary rehab program     Interventions  Encouraged to attend Pulmonary Rehabilitation for the exercise  Encouraged to attend Pulmonary Rehabilitation for the exercise  Encouraged to attend Pulmonary Rehabilitation for the exercise     Continue Psychosocial Services   Follow up required by staff  Follow up required by staff  Follow up required by staff        Psychosocial Discharge (Final Psychosocial Re-Evaluation): Psychosocial Re-Evaluation - 10/26/17 0848      Psychosocial Re-Evaluation   Current issues with  Current Depression;History of Depression    Comments  No change re: depression since orientation.  Pt PCP continues to tweak pt medications.  This has been difficult for pt.  Pt reports mood swings and tearful episodes  over very little issues.  Pt attended Meditation and mindfulness education class.    Expected Outcomes  patient will remain free from psychosocial barriers to participation in pulmonary rehab program    Interventions  Encouraged to attend Pulmonary Rehabilitation for the exercise    Continue Psychosocial Services   Follow up required by staff       Education: Education Goals: Education classes will be provided on a weekly basis, covering required topics. Participant will state understanding/return demonstration of topics presented.  Learning Barriers/Preferences: Learning Barriers/Preferences - 09/13/17 1612      Learning Barriers/Preferences   Learning Barriers  Sight    Learning Preferences  Computer/Internet;Written Material;Video;Verbal Instruction;Skilled Demonstration;Pictoral;Individual Instruction       Education Topics: Risk Factor Reduction:  -Group instruction that is supported by a PowerPoint presentation. Instructor discusses the definition of a risk factor, different risk factors for pulmonary disease, and how the heart and lungs work together.     Nutrition for Pulmonary Patient:  -Group instruction provided by PowerPoint slides, verbal discussion, and written materials to support subject matter. The instructor gives an explanation and review of healthy diet recommendations, which includes a discussion on weight management, recommendations  for fruit and vegetable consumption, as well as protein, fluid, caffeine, fiber, sodium, sugar, and alcohol. Tips for eating when patients are short of breath are discussed.   PULMONARY REHAB OTHER RESPIRATORY from 10/21/2017 in [place]  Date  05/27/17  Educator  edna  Instruction Review Code  2- Demonstrated Understanding      Pursed Lip Breathing:  -Group instruction that is supported by demonstration and informational handouts. Instructor discusses the benefits of pursed lip and diaphragmatic  breathing and detailed demonstration on how to preform both.     Oxygen Safety:  -Group instruction provided by PowerPoint, verbal discussion, and written material to support subject matter. There is an overview of "What is Oxygen" and "Why do we need it".  Instructor also reviews how to create a safe environment for oxygen use, the importance of using oxygen as prescribed, and the risks of noncompliance. There is a brief discussion on traveling with oxygen and resources the patient may utilize.   PULMONARY REHAB OTHER RESPIRATORY from 10/21/2017 in [place]  Date  04/15/17  Educator  RN  Instruction Review Code  2- Demonstrated Understanding      Oxygen Equipment:  -Group instruction provided by [facility] Staff utilizing handouts, written materials, and equipment demonstrations.   Signs and Symptoms:  -Group instruction provided by written material and verbal discussion to support subject matter. Warning signs and symptoms of infection, stroke, and heart attack are reviewed and when to call the physician/911 reinforced. Tips for preventing the spread of infection discussed.   Advanced Directives:  -Group instruction provided by verbal instruction and written material to support subject matter. Instructor reviews Advanced Directive laws and proper instruction for filling out document.   Pulmonary Video:  -Group video education that reviews the importance of medication and oxygen compliance, exercise, good nutrition, pulmonary hygiene, and pursed lip and diaphragmatic breathing for the pulmonary patient.   Exercise for the Pulmonary Patient:  -Group instruction that is supported by a PowerPoint presentation. Instructor discusses benefits of exercise, core components of exercise, frequency, duration, and intensity of an exercise routine, importance of utilizing pulse oximetry during exercise, safety while exercising, and options of places to exercise  outside of rehab.     PULMONARY REHAB OTHER RESPIRATORY from 10/21/2017 in [place]  Date  10/07/17  Instruction Review Code  1- Verbalizes Understanding      Pulmonary Medications:  -Verbally interactive group education provided by instructor with focus on inhaled medications and proper administration.   PULMONARY REHAB OTHER RESPIRATORY from 10/21/2017 in [place]  Date  06/03/17  Educator  Pharmacist  Instruction Review Code  1- Verbalizes Understanding      Anatomy and Physiology of the Respiratory System and Intimacy:  -Group instruction provided by PowerPoint, verbal discussion, and written material to support subject matter. Instructor reviews respiratory cycle and anatomical components of the respiratory system and their functions. Instructor also reviews differences in obstructive and restrictive respiratory diseases with examples of each. Intimacy, Sex, and Sexuality differences are reviewed with a discussion on how relationships can change when diagnosed with pulmonary disease. Common sexual concerns are reviewed.   MD DAY -A group question and answer session with a medical doctor that allows participants to ask questions that relate to their pulmonary disease state.   OTHER EDUCATION -Group or individual verbal, written, or video instructions that support the educational goals of the pulmonary rehab program.  Holiday Eating Survival Tips:  -Group instruction provided by PowerPoint slides, verbal discussion, and written materials to support subject matter. The instructor gives patients tips, tricks, and techniques to help them not only survive but enjoy the holidays despite the onslaught of food that accompanies the holidays.   Knowledge Questionnaire Score: Knowledge Questionnaire Score - 10/13/17 1407      Knowledge Questionnaire Score   Pre Score  18/18       Core Components/Risk Factors/Patient  Goals at Admission: Personal Goals and Risk Factors at Admission - 09/13/17 1420      Core Components/Risk Factors/Patient Goals on Admission    Weight Management  Yes;Weight Loss    Intervention  Weight Management/Obesity: Establish reasonable short term and long term weight goals.;Obesity: Provide education and appropriate resources to help participant work on and attain dietary goals.;Weight Management: Provide education and appropriate resources to help participant work on and attain dietary goals.;Weight Management: Develop a combined nutrition and exercise program designed to reach desired caloric intake, while maintaining appropriate intake of nutrient and fiber, sodium and fats, and appropriate energy expenditure required for the weight goal.    Admit Weight  270 lb 15.1 oz (122.9 kg)    Goal Weight: Short Term  260 lb (117.9 kg)    Goal Weight: Long Term  250 lb (113.4 kg)    Expected Outcomes  Long Term: Adherence to nutrition and physical activity/exercise program aimed toward attainment of established weight goal;Weight Loss: Understanding of general recommendations for a balanced deficit meal plan, which promotes 1-2 lb weight loss per week and includes a negative energy balance of [phone] kcal/d;Understanding recommendations for meals to include 15-35% energy as protein, 25-35% energy from fat, 35-60% energy from carbohydrates, less than 228m of dietary cholesterol, 20-35 gm of total fiber daily;Understanding of distribution of calorie intake throughout the day with the consumption of 4-5 meals/snacks    Improve shortness of breath with ADL's  Yes    Intervention  Provide education, individualized exercise plan and daily activity instruction to help decrease symptoms of SOB with activities of daily living.    Expected Outcomes  Short Term: Improve cardiorespiratory fitness to achieve a reduction of symptoms when performing ADLs;Long Term: Be able to perform more ADLs without symptoms or  delay the onset of symptoms    Heart Failure  Yes    Intervention  Provide a combined exercise and nutrition program that is supplemented with education, support and counseling about heart failure. Directed toward relieving symptoms such as shortness of breath, decreased exercise tolerance, and extremity edema.    Expected Outcomes  Improve functional capacity of life;Short term: Attendance in program 2-3 days a week with increased exercise capacity. Reported lower sodium intake. Reported increased fruit and vegetable intake. Reports medication compliance.;Short term: Daily weights obtained and reported for increase. Utilizing diuretic protocols set by physician.;Long term: Adoption of self-care skills and reduction of barriers for early signs and symptoms recognition and intervention leading to self-care maintenance.    Stress  Yes    Intervention  Offer individual and/or small group education and counseling on adjustment to heart disease, stress management and health-related lifestyle change. Teach and support self-help strategies.;Refer participants experiencing significant psychosocial distress to appropriate mental health specialists for further evaluation and treatment. When possible, include family members and significant others in education/counseling sessions.    Expected Outcomes  Short Term: Participant demonstrates changes in health-related behavior, relaxation and other stress management skills, ability to obtain effective social support, and compliance with  psychotropic medications if prescribed.;Long Term: Emotional wellbeing is indicated by absence of clinically significant psychosocial distress or social isolation.       Core Components/Risk Factors/Patient Goals Review:  Goals and Risk Factor Review    Row Name 05/10/17 1204 06/14/17 1622 10/26/17 0851 10/26/17 0852 10/27/17 0955     Core Components/Risk Factors/Patient Goals Review   Personal Goals Review  Weight  Management/Obesity;Improve shortness of breath with ADL's;Develop more efficient breathing techniques such as purse lipped breathing and diaphragmatic breathing and practicing self-pacing with activity.;Stress  Weight Management/Obesity;Improve shortness of breath with ADL's;Develop more efficient breathing techniques such as purse lipped breathing and diaphragmatic breathing and practicing self-pacing with activity.;Stress  Weight Management/Obesity;Improve shortness of breath with ADL's;Develop more efficient breathing techniques such as purse lipped breathing and diaphragmatic breathing and practicing self-pacing with activity.;Stress  Weight Management/Obesity;Improve shortness of breath with ADL's;Heart Failure;Stress;Diabetes  Weight Management/Obesity;Improve shortness of breath with ADL's;Heart Failure;Stress;Diabetes;Increase knowledge of respiratory medications and ability to use respiratory devices properly.;Develop more efficient breathing techniques such as purse lipped breathing and diaphragmatic breathing and practicing self-pacing with activity.   Review  has attended 4 exercise sessions, was hospitalized last week with influenza, will be out this week to get her strength back and will return next week.  Due to illness unable to meet admission goals.  patient continues to have medical issues that interfere with attendance to pulmonary rehab. as of last week, patient was diagnosed with shingles. she will not return this week and we will readdress on Monday of next. she has not lost any weight however she has not changed her eating habits related to other medical issues that has taken precidences. she works hard when she is in attendance and has even self increased workloads on equiptment. as of now she has not shown much progression towards pulmonary goals. expect to see better progress towards pulmonary rehab goals over the next 30 days if she is able to have consistant attendance.  Issa Luster has  completed 5 exercise classess with one incomplete due to elevation in CBG over 300.  Weight:  pt has shown increase in her weight of 1 kg since starting on 7/30.  This is due to pt inablitiy to increase her physical actiivty outside of Pulmonary rehab..  This is do in part with pt significant history of depression. Pt met with dietician on 8/13. PCP is tweaking her medications and this has been a difficult transition for pt.  Continue to encourage and support pt. Pt with inrease in nustep level to 4, Track remains at 6 laps in a 15 minute station. Arm crank is level 1.  Will try to increase pt workloads and have pt engage in activities that will provide pt opportunities for movement at home.  Expect to see some progress twoard pulmonary rehab goals over the next 30 days.  -   Expected Outcomes  Slow progression d/t chronic pain issues and recent hospitalization.  see "admission" expected outcomes  see "admission" expected outcomes  see "admission" expected outcomes  See Admission Outcomes/Goals      Core Components/Risk Factors/Patient Goals at Discharge (Final Review):  Goals and Risk Factor Review - 10/27/17 0955      Core Components/Risk Factors/Patient Goals Review   Personal Goals Review  Weight Management/Obesity;Improve shortness of breath with ADL's;Heart Failure;Stress;Diabetes;Increase knowledge of respiratory medications and ability to use respiratory devices properly.;Develop more efficient breathing techniques such as purse lipped breathing and diaphragmatic breathing and practicing self-pacing with activity.  Expected Outcomes  See Admission Outcomes/Goals       ITP Comments: ITP Comments    Row Name 09/13/17 1349 10/27/17 0955         ITP Comments  Dr. Jennet Maduro, Medical Director  Dr. Jennet Maduro, Medical Director         Comments: Pt has completed 5 exercise sessions and 1 incomplete session due to elevation in CBG since starting Pulmonary Rehab on 7/30.  Cherre Huger, BSN Cardiac and Training and development officer

## 2017-10-28 ENCOUNTER — Encounter (HOSPITAL_COMMUNITY)
Admission: RE | Admit: 2017-10-28 | Discharge: 2017-10-28 | Disposition: A | Discharge status: Home / Self Care | Payer: Medicare HMO | Source: Ambulatory Visit | Attending: Pulmonary Disease | Admitting: Pulmonary Disease

## 2017-10-28 ENCOUNTER — Encounter (HOSPITAL_COMMUNITY): Payer: Medicare HMO

## 2017-10-28 DIAGNOSIS — J449 Chronic obstructive pulmonary disease, unspecified: Secondary | ICD-10-CM | POA: Diagnosis not present

## 2017-10-28 DIAGNOSIS — J45902 Unspecified asthma with status asthmaticus: Secondary | ICD-10-CM

## 2017-11-01 DIAGNOSIS — Z79899 Other long term (current) drug therapy: Secondary | ICD-10-CM | POA: Diagnosis not present

## 2017-11-02 ENCOUNTER — Encounter (HOSPITAL_COMMUNITY)
Admission: RE | Admit: 2017-11-02 | Discharge: 2017-11-02 | Disposition: A | Discharge status: Home / Self Care | Payer: Medicare HMO | Source: Ambulatory Visit | Attending: Pulmonary Disease | Admitting: Pulmonary Disease

## 2017-11-02 ENCOUNTER — Encounter (HOSPITAL_COMMUNITY): Payer: Medicare HMO

## 2017-11-02 ENCOUNTER — Ambulatory Visit (HOSPITAL_COMMUNITY): Admit: 2017-11-02 | Payer: Medicare HMO | Admitting: Gastroenterology

## 2017-11-02 ENCOUNTER — Encounter (HOSPITAL_COMMUNITY): Payer: Self-pay

## 2017-11-02 DIAGNOSIS — J449 Chronic obstructive pulmonary disease, unspecified: Secondary | ICD-10-CM

## 2017-11-02 DIAGNOSIS — J45902 Unspecified asthma with status asthmaticus: Secondary | ICD-10-CM

## 2017-11-02 SURGERY — COLONOSCOPY WITH PROPOFOL
Anesthesia: Monitor Anesthesia Care

## 2017-11-02 NOTE — Progress Notes (Signed)
Daily Session Note  Patient Details  Name: Kathleen Hatfield MRN: [id number] Date of Birth: 13-Mar-1955 Referring Provider:     Pulmonary Rehab Walk Test from 09/28/2017 in [place]  Referring Provider  Dr. Halford Chessman      Encounter Date: 11/02/2017  Check In: Session Check In - 11/02/17 1526      Check-In   Supervising physician immediately available to respond to emergencies  [facility] immediately available    Physician(s)  Dr. Jonnie Finner    Location  [facility]    Staff Present  Su Hilt, MS, ACSM RCEP, Exercise Physiologist;Carlette Wilber Oliphant, RN, BSN;Marshayla Mitschke Ysidro Evert, Felipe Drone, RN, MHA    Medication changes reported      No    Fall or balance concerns reported     No    Tobacco Cessation  No Change    Warm-up and Cool-down  Performed as group-led instruction    Resistance Training Performed  Yes    VAD Patient?  No    PAD/SET Patient?  No      Pain Assessment   Currently in Pain?  No/denies    Multiple Pain Sites  No       Capillary Blood Glucose: No results found for this or any previous visit (from the past 24 hour(s)).    Social History   Tobacco Use  Smoking Status Never Smoker  Smokeless Tobacco Never Used    Goals Met:  Exercise tolerated well No report of cardiac concerns or symptoms Strength training completed today  Goals Unmet:  Not Applicable  Comments: Service time is from 1330 to 1510    Dr. Rush Farmer is Medical Director for Pulmonary Rehab at [facility].

## 2017-11-03 NOTE — Progress Notes (Signed)
KARYSA HEFT 62 y.o. female   DOB: 1956/01/07 MRN: [id number]          Nutrition 1. 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 Stage 2 moderate COPD by GOLD classification [facility])    Past Medical History:  Diagnosis Date  . Anemia   . Arthritis   . Asthma   . CHF (congestive heart failure) ([place]) 05/2014  . Chronic diastolic heart failure ([place])   . CKD (chronic kidney disease)   . Depression   . Diabetes mellitus without complication ([place])   . Diverticulitis   . Dyspnea    with exertion  . GERD (gastroesophageal reflux disease)   . History of blood transfusion   . Hyperlipidemia    cannot tolerate statins  . Hypertension   . Peripheral neuropathy   . Pneumonia   . PONV (postoperative nausea and vomiting)   . Sleep apnea    uses CPAP  . Ulcerative colitis ([place])    dr Collene Mares   Meds reviewed. Rosuvastatin, liraglutide, Vit D, lasix noted  Ht: Ht Readings from Last 1 Encounters:  09/13/17 5' 4.75" (1.645 m)     Wt:  Wt Readings from Last 3 Encounters:  10/12/17 275 lb 2.2 oz (124.8 kg)  09/13/17 270 lb 15.1 oz (122.9 kg)  07/30/17 269 lb 6.4 oz (122.2 kg)     BMI: 46.14    Current tobacco use? no  Labs:  Lipid Panel     Component Value Date/Time   CHOL 236 (H) 03/25/2013 0500   TRIG 345 (H) 03/25/2013 0500   HDL 34 (L) 03/25/2013 0500   CHOLHDL 6.9 03/25/2013 0500   VLDL 69 (H) 03/25/2013 0500   LDLCALC 133 (H) 03/25/2013 0500    Lab Results  Component Value Date   HGBA1C 8.5 (H) 05/04/2017   Note Spoke with pt. Pt is obese.  Pt eats 3 meals a day; most prepared at home.  Making healthy food choices the majority of the time. Pt's Rate Your Plate results reviewed with pt. Pt does not avoid salty food; uses canned/ convenience foods often.  Pt does not add salt to food.  The role of sodium in lung disease reviewed with pt. Pt expressed understanding of the information reviewed.   Pt is diabetic.  Pt  checks CBG's 1-2 times a day.  CBG's reportedly  150-160 mg/dL before meals. Pt reports Pre-exercise CBG's have been  200-230 mg/dL and post-exercise CBG's have been 180-190  mg/dL; most CBG's above desired goal. Educated pt on carbohydrate counting, portion sizes of different carbohydrates distributed handout. Additionally discussed eating regular meals and snacks across the day, emphasized the importance of being consistent day to day to help manage blood glucose levels. Pt expressed understanding. Addtionally discussed the importance of managing blood glucose levels and its importance in reducing strain on her kidneys (pt CKD stage III). Pt verbalized understanding via feedback method  Nutrition Diagnosis  Overweight/obesity related to excessive energy intake as evidenced by a BMI of 45.44  Nutrition Intervention ? Pt's individual nutrition plan and goals reviewed with pt. ? Benefits of adopting healthy eating habits discussed when pt's Rate Your Plate reviewed.  Goal(s) 1. Pt to identify and limit food sources of sodium, saturated fat, trans fat, simple carbohydrates 2. Identify food quantities necessary to achieve wt loss of  -2# per week to a goal wt loss of 2.7-10.9 kg (6-24 lb) at graduation from pulmonary rehab. 3. CBG's in the normal  range or as close to normal as is safely possible.  Plan:  Pt to attend Pulmonary Nutrition class Will provide client-centered nutrition education as part of interdisciplinary care.    Monitor and Evaluate progress toward nutrition goal with team.   Laurina Bustle, MS, RD, LDN 11/02/2017 9:31 AM

## 2017-11-04 ENCOUNTER — Encounter (HOSPITAL_COMMUNITY)
Admission: RE | Admit: 2017-11-04 | Discharge: 2017-11-04 | Disposition: A | Discharge status: Home / Self Care | Payer: Medicare HMO | Source: Ambulatory Visit | Attending: Pulmonary Disease | Admitting: Pulmonary Disease

## 2017-11-04 ENCOUNTER — Encounter (HOSPITAL_COMMUNITY): Payer: Medicare HMO

## 2017-11-04 DIAGNOSIS — J45902 Unspecified asthma with status asthmaticus: Secondary | ICD-10-CM

## 2017-11-04 DIAGNOSIS — J449 Chronic obstructive pulmonary disease, unspecified: Secondary | ICD-10-CM

## 2017-11-04 DIAGNOSIS — E1129 Type 2 diabetes mellitus with other diabetic kidney complication: Secondary | ICD-10-CM | POA: Diagnosis not present

## 2017-11-04 DIAGNOSIS — E782 Mixed hyperlipidemia: Secondary | ICD-10-CM | POA: Diagnosis not present

## 2017-11-04 NOTE — Progress Notes (Signed)
Daily Session Note  Patient Details  Name: Kathleen Hatfield MRN: [id number] Date of Birth: [birth year] Referring Provider:     Pulmonary Rehab Walk Test from 09/28/2017 in [place]  Referring Provider  Dr. Halford Chessman      Encounter Date: 11/04/2017  Check In: Session Check In - 11/04/17 1342      Check-In   Supervising physician immediately available to respond to emergencies  [facility] immediately available    Physician(s)  Dr. Florene Glen    Location  [facility]    Staff Present  Su Hilt, MS, ACSM RCEP, Exercise Physiologist;Carlette Wilber Oliphant, RN, BSN;Ramon Dredge, RN, MHA    Medication changes reported      No    Fall or balance concerns reported     No    Tobacco Cessation  No Change    Warm-up and Cool-down  Performed as group-led instruction    Resistance Training Performed  Yes    VAD Patient?  No    PAD/SET Patient?  No      Pain Assessment   Currently in Pain?  No/denies    Multiple Pain Sites  No       Capillary Blood Glucose: No results found for this or any previous visit (from the past 24 hour(s)).    Social History   Tobacco Use  Smoking Status Never Smoker  Smokeless Tobacco Never Used    Goals Met:  Exercise tolerated well  Goals Unmet:  Not Applicable  Comments: Service time is from 1:30p to 3:45p    Dr. Rush Farmer is Medical Director for Pulmonary Rehab at [facility].

## 2017-11-09 ENCOUNTER — Encounter (HOSPITAL_COMMUNITY): Payer: Medicare HMO

## 2017-11-09 ENCOUNTER — Encounter (HOSPITAL_COMMUNITY)
Admission: RE | Admit: 2017-11-09 | Discharge: 2017-11-09 | Disposition: A | Discharge status: Home / Self Care | Payer: Medicare HMO | Source: Ambulatory Visit | Attending: Pulmonary Disease | Admitting: Pulmonary Disease

## 2017-11-09 VITALS — Wt 272.0 lb

## 2017-11-09 DIAGNOSIS — J45902 Unspecified asthma with status asthmaticus: Secondary | ICD-10-CM

## 2017-11-09 DIAGNOSIS — J449 Chronic obstructive pulmonary disease, unspecified: Secondary | ICD-10-CM | POA: Diagnosis not present

## 2017-11-09 NOTE — Progress Notes (Signed)
Daily Session Note  Patient Details  Name: Kathleen Hatfield MRN: [id number] Date of Birth: 1955/05/28 Referring Provider:     Pulmonary Rehab Walk Test from 09/28/2017 in [place]  Referring Provider  Dr. Halford Chessman      Encounter Date: 11/09/2017  Check In: Session Check In - 11/09/17 1523      Check-In   Supervising physician immediately available to respond to emergencies  [facility] immediately available    Physician(s)  Dr. Karleen Hampshire    Location  [facility]    Staff Present  Maurice Small, RN, BSN;Molly DiVincenzo, MS, ACSM RCEP, Exercise Physiologist;Orvil Faraone Ysidro Evert, RN;Maria Whitaker, RN, BSN    Medication changes reported      No    Fall or balance concerns reported     No    Tobacco Cessation  No Change    Warm-up and Cool-down  Performed as group-led Higher education careers adviser Performed  Yes    VAD Patient?  No    PAD/SET Patient?  No      Pain Assessment   Pain Score  0-No pain    Multiple Pain Sites  No       Capillary Blood Glucose: No results found for this or any previous visit (from the past 24 hour(s)). POCT Glucose - 11/09/17 1632      POCT Blood Glucose   Pre-Exercise  124 mg/dL    Post-Exercise  123 mg/dL      Exercise Prescription Changes - 11/09/17 1600      Response to Exercise   Blood Pressure (Admit)  130/50    Blood Pressure (Exercise)  124/62    Blood Pressure (Exit)  118/60    Heart Rate (Admit)  81 bpm    Heart Rate (Exercise)  109 bpm    Heart Rate (Exit)  91 bpm    Oxygen Saturation (Admit)  93 %    Oxygen Saturation (Exercise)  92 %    Oxygen Saturation (Exit)  92 %    Rating of Perceived Exertion (Exercise)  19    Perceived Dyspnea (Exercise)  4    Duration  Progress to 45 minutes of aerobic exercise without signs/symptoms of physical distress    Intensity  THRR unchanged      Progression   Progression  Continue to progress workloads to maintain intensity without  signs/symptoms of physical distress.      Resistance Training   Training Prescription  Yes    Weight  blue bands    Reps  10-15    Time  10 Minutes      Interval Training   Interval Training  No      NuStep   Level  6    SPM  80    Minutes  34    METs  2.1      Track   Laps  8    Minutes  17       Social History   Tobacco Use  Smoking Status Never Smoker  Smokeless Tobacco Never Used    Goals Met:  Exercise tolerated well No report of cardiac concerns or symptoms Strength training completed today  Goals Unmet:  Not Applicable  Comments: Service time is from 1330 to 1515    Dr. Rush Farmer is Medical Director for Pulmonary Rehab at [facility].

## 2017-11-11 ENCOUNTER — Encounter (HOSPITAL_COMMUNITY): Payer: Medicare HMO

## 2017-11-11 ENCOUNTER — Encounter (HOSPITAL_COMMUNITY)
Admission: RE | Admit: 2017-11-11 | Discharge: 2017-11-11 | Disposition: A | Discharge status: Home / Self Care | Payer: Medicare HMO | Source: Ambulatory Visit | Attending: Pulmonary Disease | Admitting: Pulmonary Disease

## 2017-11-11 DIAGNOSIS — G4733 Obstructive sleep apnea (adult) (pediatric): Secondary | ICD-10-CM | POA: Diagnosis not present

## 2017-11-11 DIAGNOSIS — J449 Chronic obstructive pulmonary disease, unspecified: Secondary | ICD-10-CM | POA: Diagnosis not present

## 2017-11-11 DIAGNOSIS — J45902 Unspecified asthma with status asthmaticus: Secondary | ICD-10-CM

## 2017-11-11 NOTE — Progress Notes (Signed)
Daily Session Note  Patient Details  Name: Kathleen Hatfield MRN: [id number] Date of Birth: 1955-07-20 Referring Provider:     Pulmonary Rehab Walk Test from 09/28/2017 in [place]  Referring Provider  Dr. Halford Chessman      Encounter Date: 11/11/2017  Check In: Session Check In - 11/11/17 1523      Check-In   Supervising physician immediately available to respond to emergencies  [facility] immediately available    Physician(s)  Dr. Lars Mage     Location  [facility]    Staff Present  Rodney Langton, RN;Joann Rion, RN, BSN;Carlette Wilber Oliphant, RN, BSN;Ramon Dredge, RN, MHA    Medication changes reported      No    Fall or balance concerns reported     No    Tobacco Cessation  No Change    Warm-up and Cool-down  Performed as group-led instruction    Resistance Training Performed  Yes    VAD Patient?  No    PAD/SET Patient?  No      Pain Assessment   Currently in Pain?  No/denies       Capillary Blood Glucose: No results found for this or any previous visit (from the past 24 hour(s)).    Social History   Tobacco Use  Smoking Status Never Smoker  Smokeless Tobacco Never Used    Goals Met:  Exercise tolerated well No report of cardiac concerns or symptoms Strength training completed today  Goals Unmet:  Not Applicable  Comments: Service time is from 1330 to 1505    Dr. Rush Farmer is Medical Director for Pulmonary Rehab at [facility].

## 2017-11-12 DIAGNOSIS — Z79899 Other long term (current) drug therapy: Secondary | ICD-10-CM | POA: Diagnosis not present

## 2017-11-12 DIAGNOSIS — E782 Mixed hyperlipidemia: Secondary | ICD-10-CM | POA: Diagnosis not present

## 2017-11-12 DIAGNOSIS — E1129 Type 2 diabetes mellitus with other diabetic kidney complication: Secondary | ICD-10-CM | POA: Diagnosis not present

## 2017-11-12 DIAGNOSIS — R69 Illness, unspecified: Secondary | ICD-10-CM | POA: Diagnosis not present

## 2017-11-16 ENCOUNTER — Telehealth (HOSPITAL_COMMUNITY): Payer: Self-pay | Admitting: Family Medicine

## 2017-11-16 ENCOUNTER — Encounter (HOSPITAL_COMMUNITY): Payer: Medicare HMO

## 2017-11-17 DIAGNOSIS — Z6841 Body Mass Index (BMI) 40.0 and over, adult: Secondary | ICD-10-CM | POA: Diagnosis not present

## 2017-11-17 DIAGNOSIS — N183 Chronic kidney disease, stage 3 (moderate): Secondary | ICD-10-CM | POA: Diagnosis not present

## 2017-11-17 DIAGNOSIS — Z794 Long term (current) use of insulin: Secondary | ICD-10-CM | POA: Diagnosis not present

## 2017-11-17 DIAGNOSIS — E1122 Type 2 diabetes mellitus with diabetic chronic kidney disease: Secondary | ICD-10-CM | POA: Diagnosis not present

## 2017-11-17 DIAGNOSIS — I129 Hypertensive chronic kidney disease with stage 1 through stage 4 chronic kidney disease, or unspecified chronic kidney disease: Secondary | ICD-10-CM | POA: Diagnosis not present

## 2017-11-18 ENCOUNTER — Encounter (HOSPITAL_COMMUNITY)
Admission: RE | Admit: 2017-11-18 | Discharge: 2017-11-18 | Disposition: A | Discharge status: Home / Self Care | Payer: Medicare HMO | Source: Ambulatory Visit | Attending: Pulmonary Disease | Admitting: Pulmonary Disease

## 2017-11-18 ENCOUNTER — Encounter (HOSPITAL_COMMUNITY): Payer: Medicare HMO

## 2017-11-18 DIAGNOSIS — J449 Chronic obstructive pulmonary disease, unspecified: Secondary | ICD-10-CM

## 2017-11-18 DIAGNOSIS — J45902 Unspecified asthma with status asthmaticus: Secondary | ICD-10-CM

## 2017-11-18 NOTE — Progress Notes (Signed)
Daily Session Note  Patient Details  Name: Kathleen Hatfield MRN: [id number] Date of Birth: 05-Feb-1956 Referring Provider:     Pulmonary Rehab Walk Test from 09/28/2017 in [place]  Referring Provider  Dr. Halford Chessman      Encounter Date: 11/18/2017  Check In: Session Check In - 11/18/17 1503      Check-In   Supervising physician immediately available to respond to emergencies  [facility] immediately available    Physician(s)  Dr. Dyann Kief    Location  [facility]    Staff Present  Maurice Small, RN, BSN;Molly DiVincenzo, MS, ACSM RCEP, Exercise Physiologist;Clemie General [place]sidro Evert, Felipe Drone, RN, MHA    Medication changes reported      No    Fall or balance concerns reported     No    Tobacco Cessation  No Change    Warm-up and Cool-down  Performed as group-led instruction    Resistance Training Performed  No    VAD Patient?  No    PAD/SET Patient?  No      Pain Assessment   Currently in Pain?  No/denies    Pain Score  0-No pain    Multiple Pain Sites  No       Capillary Blood Glucose: No results found for this or any previous visit (from the past 24 hour(s)).    Social History   Tobacco Use  Smoking Status Never Smoker  Smokeless Tobacco Never Used    Goals Met:  Exercise tolerated well No report of cardiac concerns or symptoms Strength training completed today  Goals Unmet:  Not Applicable  Comments: Service time is from 1330 to 1500    Dr. Rush Farmer is Medical Director for Pulmonary Rehab at [facility].

## 2017-11-19 NOTE — Progress Notes (Signed)
Kathleen Hatfield 62 y.o. female   DOB: June 19, 1955 MRN: [id number]          Nutrition 1. 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 Anemia   . Arthritis   . Asthma   . CHF (congestive heart failure) ([place]) 05/2014  . Chronic diastolic heart failure ([place])   . CKD (chronic kidney disease)   . Depression   . Diabetes mellitus without complication ([place])   . Diverticulitis   . Dyspnea    with exertion  . GERD (gastroesophageal reflux disease)   . History of blood transfusion   . Hyperlipidemia    cannot tolerate statins  . Hypertension   . Peripheral neuropathy   . Pneumonia   . PONV (postoperative nausea and vomiting)   . Sleep apnea    uses CPAP  . Ulcerative colitis ([place])    dr Collene Mares     Meds reviewed.  Current Outpatient Medications (Endocrine & Metabolic):  Marland Kitchen  Insulin Degludec (TRESIBA FLEXTOUCH) 200 UNIT/ML SOPN, Inject 74 Units into the skin daily. .  Liraglutide (VICTOZA) 18 MG/3ML SOPN, Inject 1.8 mg into the skin daily with lunch.   Current Outpatient Medications (Cardiovascular):  .  fenofibrate (TRICOR) 145 MG tablet, Take 145 mg by mouth at bedtime.  .  furosemide (LASIX) 40 MG tablet, Take 40 mg by mouth daily. 09/13/17  Pt takes 1 1/2 daily .  lisinopril (PRINIVIL,ZESTRIL) 10 MG tablet, Take 10 mg by mouth daily. .  metolazone (ZAROXOLYN) 5 MG tablet, Take 5 mg by mouth daily as needed (Take with Lasix when weight is elevated 3 pounds overnight and 5 pounds or more for 2 days). .  rosuvastatin (CRESTOR) 10 MG tablet, Take 10 mg by mouth once a week.  Current Outpatient Medications (Respiratory):  Marland Kitchen  Aclidinium Bromide (TUDORZA PRESSAIR) 400 MCG/ACT AEPB, Inhale 1 puff into the lungs 2 (two) times daily. Marland Kitchen  albuterol (PROAIR HFA) 108 (90 Base) MCG/ACT inhaler, INHALE 2 PUFFS INTO THE LUNGS EVERY FOUR HOURS AS NEEDED FOR  WHEEZING. .  budesonide-formoterol (SYMBICORT) 160-4.5 MCG/ACT inhaler, Inhale 2 puffs into the lungs 2 (two) times daily. Marland Kitchen  guaiFENesin-dextromethorphan (ROBITUSSIN DM) 100-10 MG/5ML syrup, Take 5 mLs by mouth every 6 (six) hours as needed for cough.    Current Outpatient Medications (Other):  .  BAYER CONTOUR NEXT TEST test strip, As directed .  Cholecalciferol (VITAMIN D-3) 5000 UNITS TABS, Take 5,000 Units by mouth daily.  .  DULoxetine (CYMBALTA) 60 MG capsule, Take 60 mg by mouth daily.  Marland Kitchen  gabapentin (NEURONTIN) 100 MG capsule, Take 200-400 mg by mouth See admin instructions. Take 3 capsules at bedtime .  GLOBAL EASE INJECT PEN NEEDLES 32G X 4 MM MISC,  .  MICROLET LANCETS MISC, As directed .  pantoprazole (PROTONIX) 40 MG tablet, Take 40 mg by mouth daily.  .  potassium chloride (K-DUR,KLOR-CON) 10 MEQ tablet, Take 10 mEq by mouth 2 (two) times daily. Marland Kitchen  rOPINIRole (REQUIP) 4 MG tablet, Take 4 mg by mouth 2 (two) times daily. 7/8 Pt take 1 in the morning and 1 1/2 mg at bedtime .  UNABLE TO FIND, CPAP at bedtime  Ht: Ht Readings from Last 1 Encounters:  09/13/17 5' 4.75" (1.645 m)     Wt:  Wt Readings from Last 3 Encounters:  11/09/17 272 lb 0.8 oz (123.4  kg)  10/12/17 275 lb 2.2 oz (124.8 kg)  09/13/17 270 lb 15.1 oz (122.9 kg)     BMI: There is no height or weight on file to calculate BMI.     Current tobacco use? No  Labs:  Lipid Panel     Component Value Date/Time   CHOL 236 (H) 03/25/2013 0500   TRIG 345 (H) 03/25/2013 0500   HDL 34 (L) 03/25/2013 0500   CHOLHDL 6.9 03/25/2013 0500   VLDL 69 (H) 03/25/2013 0500   LDLCALC 133 (H) 03/25/2013 0500    Lab Results  Component Value Date   HGBA1C 8.5 (H) 05/04/2017   Note Spoke with pt. Pt is obese.  Pt eats 3 meals a day; most prepared at home.  Making healthy food choices the majority of the time. Pt's Rate Your Plate results reviewed with pt. Pt reports she avoids salty food; uses only no added salt  canned foods/ and now has limited consumption of convenience foods.  Pt does not add salt to food. Reviewed carbohydrate counting, and portion sizes of different carbohydrates with patient. Discussed web sites for diabetic renal appropriate recipes and distributed recipes to patient. Additionally discussed eating regular meals and snacks across the day, emphasized the importance of being consistent day to day to help manage blood glucose levels. Reviewed with patient the importance of managing blood glucose levels and its importance in reducing strain on her kidneys (pt CKD stage III). Pt verbalized understanding via feedback method     Pt is diabetic.  Pt continues to check CBG's 1-2 times a day.  CBG's reportedly  150-160 mg/dL before meals. Pt reports Pre-exercise CBG's have been  200-230 mg/dL and post-exercise CBG's have been 180-190  mg/dL; most CBG's above desired goal.   Nutrition Diagnosis  Overweight/obesity related to excessive energy intake as evidenced by a BMI of45.44  Nutrition Intervention ? Pt's individual nutrition plan and goals reviewed with pt. ? Benefits of adopting healthy eating habits discussed when pt's Rate Your Plate reviewed.  Goal(s) 1. Pt to identify and limit food sources of sodium, saturated fat, trans fat, simple carbohydrates 2. Identify food quantities necessary to achieve wt loss of  -2# per week to a goal wt loss of 2.7-10.9 kg (6-24 lb) at graduation from pulmonary rehab. 3. CBG's in the normal range or as close to normal as is safely possible.  4. Pt to try new renal diabetic recipes.  Plan:  Pt to attend Pulmonary Nutrition class Will provide client-centered nutrition education as part of interdisciplinary care.    Monitor and Evaluate progress toward nutrition goal with team.   Laurina Bustle, MS, RD, LDN 11/19/2017 10:18 AM

## 2017-11-23 ENCOUNTER — Encounter (HOSPITAL_COMMUNITY)
Admission: RE | Admit: 2017-11-23 | Discharge: 2017-11-23 | Disposition: A | Discharge status: Home / Self Care | Payer: Medicare HMO | Source: Ambulatory Visit | Attending: Pulmonary Disease | Admitting: Pulmonary Disease

## 2017-11-23 ENCOUNTER — Encounter (HOSPITAL_COMMUNITY): Payer: Medicare HMO

## 2017-11-23 VITALS — Wt 271.8 lb

## 2017-11-23 DIAGNOSIS — J449 Chronic obstructive pulmonary disease, unspecified: Secondary | ICD-10-CM | POA: Diagnosis not present

## 2017-11-23 DIAGNOSIS — J45902 Unspecified asthma with status asthmaticus: Secondary | ICD-10-CM

## 2017-11-23 DIAGNOSIS — E1129 Type 2 diabetes mellitus with other diabetic kidney complication: Secondary | ICD-10-CM | POA: Diagnosis not present

## 2017-11-23 NOTE — Progress Notes (Signed)
Daily Session Note  Patient Details  Name: Kathleen Hatfield MRN: [id number] Date of Birth: [birth year] Referring Provider:     Pulmonary Rehab Walk Test from 09/28/2017 in [place]  Referring Provider  Dr. Halford Chessman      Encounter Date: 11/23/2017  Check In: Session Check In - 11/23/17 1524      Check-In   Supervising physician immediately available to respond to emergencies  [facility] immediately available    Physician(s)  Dr. Sloan Leiter    Location  [facility]    Staff Present  Maurice Small, RN, BSN;Veasna Santibanez, MS, ACSM RCEP, Exercise Physiologist;Lisa Ysidro Evert, Felipe Drone, RN, MHA    Medication changes reported      No    Fall or balance concerns reported     No    Tobacco Cessation  No Change    Warm-up and Cool-down  Performed as group-led instruction    Resistance Training Performed  Yes    VAD Patient?  No    PAD/SET Patient?  No      Pain Assessment   Currently in Pain?  No/denies    Pain Score  0-No pain    Multiple Pain Sites  No       Capillary Blood Glucose: No results found for this or any previous visit (from the past 24 hour(s)).  Exercise Prescription Changes - 11/23/17 1600      Response to Exercise   Blood Pressure (Admit)  120/60    Blood Pressure (Exercise)  128/80    Blood Pressure (Exit)  112/78    Heart Rate (Admit)  81 bpm    Heart Rate (Exercise)  118 bpm    Heart Rate (Exit)  94 bpm    Oxygen Saturation (Admit)  91 %    Oxygen Saturation (Exercise)  93 %    Oxygen Saturation (Exit)  95 %    Rating of Perceived Exertion (Exercise)  19    Perceived Dyspnea (Exercise)  4    Duration  Progress to 45 minutes of aerobic exercise without signs/symptoms of physical distress    Intensity  THRR unchanged      Progression   Progression  Continue to progress workloads to maintain intensity without signs/symptoms of physical distress.      Resistance Training   Training Prescription   Yes    Weight  blue bands    Reps  10-15    Time  10 Minutes      Interval Training   Interval Training  No      NuStep   Level  5    SPM  80    Minutes  34    METs  2.1      Track   Laps  9    Minutes  17       Social History   Tobacco Use  Smoking Status Never Smoker  Smokeless Tobacco Never Used    Goals Met:  Exercise tolerated well  Goals Unmet:  Not Applicable  Comments: Service time is from 1:30p to 3:15p    Dr. Rush Farmer is Medical Director for Pulmonary Rehab at [facility].

## 2017-11-24 NOTE — Progress Notes (Signed)
Pulmonary Individual Treatment Plan  Patient Details  Name: Kathleen Hatfield MRN: [id number] Date of Birth: 09-16-1955 Referring Provider:     Pulmonary Rehab Walk Test from 09/28/2017 in [place]  Referring Provider  Dr. 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  Aclidinium Bromide (TUDORZA PRESSAIR) 400 MCG/ACT AEPB, Inhale 1 puff into the lungs 2 (two) times daily., Disp: 1 each, Rfl: 5 .  albuterol (PROAIR HFA) 108 (90 Base) MCG/ACT inhaler, INHALE 2 PUFFS INTO THE LUNGS EVERY FOUR HOURS AS NEEDED FOR WHEEZING., Disp: 8.5 g, Rfl: 5 .  BAYER CONTOUR NEXT TEST test strip, As directed, Disp: , Rfl:  .  budesonide-formoterol (SYMBICORT) 160-4.5 MCG/ACT inhaler, Inhale 2 puffs into the lungs 2 (two) times daily., Disp: 1 Inhaler, Rfl: 5 .  Cholecalciferol (VITAMIN D-3) 5000 UNITS TABS, Take 5,000 Units by mouth daily. , Disp: , Rfl:  .  DULoxetine (CYMBALTA) 60 MG capsule, Take 60 mg by mouth daily. , Disp: , Rfl:  .  fenofibrate (TRICOR) 145 MG tablet, Take 145 mg by mouth at bedtime. , Disp: , Rfl: 11 .  furosemide (LASIX) 40 MG tablet, Take 40 mg by mouth daily. 09/13/17  Pt takes 1 1/2 daily, Disp: , Rfl:  .  gabapentin (NEURONTIN) 100 MG capsule, Take 200-400 mg by mouth See admin instructions. Take 3 capsules at bedtime, Disp: , Rfl:  .  GLOBAL EASE INJECT PEN NEEDLES 32G X 4 MM MISC, , Disp: , Rfl:  .  guaiFENesin-dextromethorphan (ROBITUSSIN DM) 100-10 MG/5ML syrup, Take 5 mLs by mouth every 6 (six) hours as needed for cough., Disp: 118 mL, Rfl: 0 .  Insulin Degludec (TRESIBA FLEXTOUCH) 200 UNIT/ML SOPN, Inject  74 Units into the skin daily., Disp: , Rfl:  .  Liraglutide (VICTOZA) 18 MG/3ML SOPN, Inject 1.8 mg into the skin daily with lunch. , Disp: , Rfl:  .  lisinopril (PRINIVIL,ZESTRIL) 10 MG tablet, Take 10 mg by mouth daily., Disp: , Rfl:  .  metolazone (ZAROXOLYN) 5 MG tablet, Take 5 mg by mouth daily as needed (Take with Lasix when weight is elevated 3 pounds overnight and 5 pounds or more for 2 days)., Disp: , Rfl:  .  MICROLET LANCETS MISC, As directed, Disp: , Rfl:  .  pantoprazole (PROTONIX) 40 MG tablet, Take 40 mg by mouth daily. , Disp: , Rfl:  .  potassium chloride (K-DUR,KLOR-CON) 10 MEQ tablet, Take 10 mEq by mouth 2 (two) times daily., Disp: , Rfl:  .  rOPINIRole (REQUIP) 4 MG tablet, Take 4 mg by mouth 2 (two) times daily. 7/8 Pt take 1 in the morning and 1 1/2 mg at bedtime, Disp: , Rfl:  .  rosuvastatin (CRESTOR) 10 MG tablet, Take 10 mg by mouth once a week., Disp: , Rfl:  .  UNABLE TO FIND, CPAP at bedtime, Disp: , Rfl:   Past Medical History: Past Medical History:  Diagnosis Date  . Anemia   . Arthritis   . Asthma   . CHF (congestive heart failure) ([place]) 05/2014  . Chronic diastolic heart failure ([place])   . CKD (chronic kidney disease)   .  Depression   . Diabetes mellitus without complication ([place])   . Diverticulitis   . Dyspnea    with exertion  . GERD (gastroesophageal reflux disease)   . History of blood transfusion   . Hyperlipidemia    cannot tolerate statins  . Hypertension   . Peripheral neuropathy   . Pneumonia   . PONV (postoperative nausea and vomiting)   . Sleep apnea    uses CPAP  . Ulcerative colitis ([place])    dr Collene Mares    Tobacco Use: Social History   Tobacco Use  Smoking Status Never Smoker  Smokeless Tobacco Never Used    Labs: Recent Review Flowsheet Data    Labs for ITP Cardiac and Pulmonary Rehab Latest Ref Rng & Units 10/21/2015 10/22/2015 04/02/2016 04/03/2016 05/04/2017   Cholestrol 0 - 200 mg/dL - - - - -   LDLCALC 0 - 99 mg/dL - - - -  -   HDL >39 mg/dL - - - - -   Trlycerides <150 mg/dL - - - - -   Hemoglobin A1c 4.8 - 5.6 % 7.4(H) - 7.6(H) - 8.5(H)   PHART 7.350 - 7.450 - 7.438 - 7.456(H) -   PCO2ART 32.0 - 48.0 mmHg - 41.0 - 45.2 -   HCO3 20.0 - 28.0 mmol/L - 27.3(H) - 31.4(H) -   TCO2 0 - 100 mmol/L - 25.1 - - -   O2SAT % - 95.7 - 90.6 -      Capillary Blood Glucose: Lab Results  Component Value Date   GLUCAP 204 (H) 05/06/2017   GLUCAP 266 (H) 05/06/2017   GLUCAP 221 (H) 05/05/2017   GLUCAP 211 (H) 05/05/2017   GLUCAP 291 (H) 05/05/2017   POCT Glucose    Row Name 06/03/17 1630 10/12/17 1734 10/26/17 1613 11/09/17 1632 11/23/17 1657     POCT Blood Glucose   Pre-Exercise  225 mg/dL  251 mg/dL  158 mg/dL  124 mg/dL  135 mg/dL   Post-Exercise  140 mg/dL  172 mg/dL  141 mg/dL  123 mg/dL  103 mg/dL      Pulmonary Assessment Scores: Pulmonary Assessment Scores    Row Name 09/30/17 0734 10/13/17 1407       ADL UCSD   ADL Phase  Entry  Entry    SOB Score total  -  94      CAT Score   CAT Score  -  30      mMRC Score   mMRC Score  4  -       Pulmonary Function Assessment:   Exercise Target Goals: Exercise Program Goal: Individual exercise prescription set using results from initial 6 min walk test and THRR while considering  patient's activity barriers and safety.   Exercise Prescription Goal: Initial exercise prescription builds to 30-45 minutes a day of aerobic activity, 2-3 days per week.  Home exercise guidelines will be given to patient during program as part of exercise prescription that the participant will acknowledge.  Activity Barriers & Risk Stratification: Activity Barriers & Cardiac Risk Stratification - 09/13/17 1612      Activity Barriers & Cardiac Risk Stratification   Activity Barriers  Joint Problems;Back Problems;Shortness of Breath;History of Falls;Balance Concerns;Arthritis       6 Minute Walk: 6 Minute Walk    Row Name 09/30/17 0730         6 Minute Walk    Phase  Initial     Distance  631 feet     Walk Time  -  4 minutes 25 seconds     # of Rest Breaks  2 1 minute 35 seconds total     MPH  1.19     METS  1.92     RPE  15     Perceived Dyspnea   3     Symptoms  Yes (comment)     Comments  9/10 sciatica pain     Resting HR  90 bpm     Resting BP  118/60     Resting Oxygen Saturation   90 %     Exercise Oxygen Saturation  during 6 min walk  90 %     Max Ex. HR  107 bpm     Max Ex. BP  140/58       Interval HR   1 Minute HR  106     2 Minute HR  107     3 Minute HR  106     4 Minute HR  104     5 Minute HR  104     6 Minute HR  104     2 Minute Post HR  95     Interval Heart Rate?  Yes       Interval Oxygen   Interval Oxygen?  Yes     Baseline Oxygen Saturation %  90 %     1 Minute Oxygen Saturation %  91 %     1 Minute Liters of Oxygen  0 L     2 Minute Oxygen Saturation %  92 %     2 Minute Liters of Oxygen  0 L     3 Minute Oxygen Saturation %  93 %     3 Minute Liters of Oxygen  0 L     4 Minute Oxygen Saturation %  95 %     4 Minute Liters of Oxygen  0 L     5 Minute Oxygen Saturation %  95 %     5 Minute Liters of Oxygen  0 L     6 Minute Oxygen Saturation %  95 %     6 Minute Liters of Oxygen  0 L     2 Minute Post Oxygen Saturation %  95 %     2 Minute Post Liters of Oxygen  0 L        Oxygen Initial Assessment: Oxygen Initial Assessment - 09/30/17 0730      Initial 6 min Walk   Oxygen Used  None      Program Oxygen Prescription   Program Oxygen Prescription  None       Oxygen Re-Evaluation: Oxygen Re-Evaluation    Row Name 06/11/17 1517 10/26/17 0643 11/23/17 0947         Program Oxygen Prescription   Program Oxygen Prescription  None  None  None       Home Oxygen   Home Oxygen Device  None  None  None     Sleep Oxygen Prescription  CPAP  CPAP  CPAP     Home Exercise Oxygen Prescription  None  None  None     Home at Rest Exercise Oxygen Prescription  None  None  None     Compliance with Home  Oxygen Use  Yes  Yes  Yes       Goals/Expected Outcomes   Short Term Goals  -  -  To learn and understand importance of monitoring SPO2 with pulse oximeter and demonstrate  accurate use of the pulse oximeter.;To learn and understand importance of maintaining oxygen saturations>88%;To learn and demonstrate proper pursed lip breathing techniques or other breathing techniques.;To learn and demonstrate proper use of respiratory medications     Long  Term Goals  -  -  Verbalizes importance of monitoring SPO2 with pulse oximeter and return demonstration;Maintenance of O2 saturations>88%;Exhibits proper breathing techniques, such as pursed lip breathing or other method taught during program session;Compliance with respiratory medication;Demonstrates proper use of MDI's     Goals/Expected Outcomes  maintain compliance with CPAP  maintain compliance with CPAP  maintain compliance with CPAP        Oxygen Discharge (Final Oxygen Re-Evaluation): Oxygen Re-Evaluation - 11/23/17 0947      Program Oxygen Prescription   Program Oxygen Prescription  None      Home Oxygen   Home Oxygen Device  None    Sleep Oxygen Prescription  CPAP    Home Exercise Oxygen Prescription  None    Home at Rest Exercise Oxygen Prescription  None    Compliance with Home Oxygen Use  Yes      Goals/Expected Outcomes   Short Term Goals  To learn and understand importance of monitoring SPO2 with pulse oximeter and demonstrate accurate use of the pulse oximeter.;To learn and understand importance of maintaining oxygen saturations>88%;To learn and demonstrate proper pursed lip breathing techniques or other breathing techniques.;To learn and demonstrate proper use of respiratory medications    Long  Term Goals  Verbalizes importance of monitoring SPO2 with pulse oximeter and return demonstration;Maintenance of O2 saturations>88%;Exhibits proper breathing techniques, such as pursed lip breathing or other method taught during program  session;Compliance with respiratory medication;Demonstrates proper use of MDI's    Goals/Expected Outcomes  maintain compliance with CPAP       Initial Exercise Prescription: Initial Exercise Prescription - 09/30/17 0700      Date of Initial Exercise RX and Referring Provider   Date  09/28/17    Referring Provider  Dr. Halford Chessman      NuStep   Level  3    SPM  80    Minutes  17    METs  1.5      Arm Ergometer   Level  1    Watts  10    Minutes  17      Track   Laps  4    Minutes  17      Prescription Details   Frequency (times per week)  2    Duration  Progress to 45 minutes of aerobic exercise without signs/symptoms of physical distress      Intensity   THRR 40-80% of Max Heartrate  63-126    Ratings of Perceived Exertion  11-13    Perceived Dyspnea  0-4      Progression   Progression  Continue progressive overload as per policy without signs/symptoms or physical distress.      Resistance Training   Training Prescription  Yes    Weight  blue bands    Reps  10-15       Perform Capillary Blood Glucose checks as needed.  Exercise Prescription Changes: Exercise Prescription Changes    Row Name 06/03/17 1631 10/12/17 1700 10/26/17 1600 11/09/17 1600 11/23/17 1600     Response to Exercise   Blood Pressure (Admit)  124/76  118/60  106/68  130/50  120/60   Blood Pressure (Exercise)  150/60  140/72  116/60  124/62  128/80   Blood Pressure (  Exit)  110/60  112/62  108/60  118/60  112/78   Heart Rate (Admit)  89 bpm  90 bpm  80 bpm  81 bpm  81 bpm   Heart Rate (Exercise)  107 bpm  107 bpm  103 bpm  109 bpm  118 bpm   Heart Rate (Exit)  95 bpm  96 bpm  87 bpm  91 bpm  94 bpm   Oxygen Saturation (Admit)  93 %  94 %  90 %  93 %  91 %   Oxygen Saturation (Exercise)  95 %  92 %  91 %  92 %  93 %   Oxygen Saturation (Exit)  95 %  92 %  92 %  92 %  95 %   Rating of Perceived Exertion (Exercise)  13  13  15  19  19    Perceived Dyspnea (Exercise)  3  3  2  4  4    Duration   Progress to 45 minutes of aerobic exercise without signs/symptoms of physical distress  Progress to 45 minutes of aerobic exercise without signs/symptoms of physical distress  Progress to 45 minutes of aerobic exercise without signs/symptoms of physical distress  Progress to 45 minutes of aerobic exercise without signs/symptoms of physical distress  Progress to 45 minutes of aerobic exercise without signs/symptoms of physical distress   Intensity  THRR unchanged  Other (comment) 40-80% of HRR  Other (comment) 40-80% of HRR  THRR unchanged  THRR unchanged     Progression   Progression  Continue to progress workloads to maintain intensity without signs/symptoms of physical distress.  Continue to progress workloads to maintain intensity without signs/symptoms of physical distress.  Continue to progress workloads to maintain intensity without signs/symptoms of physical distress.  Continue to progress workloads to maintain intensity without signs/symptoms of physical distress.  Continue to progress workloads to maintain intensity without signs/symptoms of physical distress.     Resistance Training   Training Prescription  Yes  Yes  Yes  Yes  Yes   Weight  orange bands  blue bands  blue bands  blue bands  blue bands   Reps  10-15  10-15  10-15  10-15  10-15   Time  10 Minutes  10 Minutes  10 Minutes  10 Minutes  10 Minutes     Interval Training   Interval Training  -  No  No  No  No     NuStep   Level  5  2  4  6  5    SPM  80  80  80  80  80   Minutes  27  17  45  34  34   METs  2.2  1.8  1.7  2.1  2.1     Arm Ergometer   Level  -  1  -  -  -   Minutes  -  17  -  -  -     Track   Laps  -  -  -  8  9   Minutes  -  -  -  17  17      Exercise Comments:   Exercise Goals and Review: Exercise Goals    Row Name 09/13/17 1436             Exercise Goals   Increase Physical Activity  Yes       Intervention  Provide advice, education, support and counseling about physical activity/exercise  needs.;Develop  an individualized exercise prescription for aerobic and resistive training based on initial evaluation findings, risk stratification, comorbidities and participant's personal goals.       Expected Outcomes  Short Term: Attend rehab on a regular basis to increase amount of physical activity.;Long Term: Add in home exercise to make exercise part of routine and to increase amount of physical activity.;Long Term: Exercising regularly at least 3-5 days a week.       Increase Strength and Stamina  Yes       Intervention  Provide advice, education, support and counseling about physical activity/exercise needs.;Develop an individualized exercise prescription for aerobic and resistive training based on initial evaluation findings, risk stratification, comorbidities and participant's personal goals.       Expected Outcomes  Short Term: Increase workloads from initial exercise prescription for resistance, speed, and METs.;Short Term: Perform resistance training exercises routinely during rehab and add in resistance training at home;Long Term: Improve cardiorespiratory fitness, muscular endurance and strength as measured by increased METs and functional capacity (6MWT)       Able to understand and use rate of perceived exertion (RPE) scale  Yes       Intervention  Provide education and explanation on how to use RPE scale       Expected Outcomes  Short Term: Able to use RPE daily in rehab to express subjective intensity level;Long Term:  Able to use RPE to guide intensity level when exercising independently       Able to understand and use Dyspnea scale  Yes       Intervention  Provide education and explanation on how to use Dyspnea scale       Expected Outcomes  Short Term: Able to use Dyspnea scale daily in rehab to express subjective sense of shortness of breath during exertion;Long Term: Able to use Dyspnea scale to guide intensity level when exercising independently       Knowledge and  understanding of Target Heart Rate Range (THRR)  Yes       Intervention  Provide education and explanation of THRR including how the numbers were predicted and where they are located for reference       Expected Outcomes  Short Term: Able to state/look up THRR;Short Term: Able to use daily as guideline for intensity in rehab;Long Term: Able to use THRR to govern intensity when exercising independently       Intervention  Provide education, explanation, and written materials on patient's individual exercise prescription       Expected Outcomes  Short Term: Able to explain program exercise prescription;Long Term: Able to explain home exercise prescription to exercise independently          Exercise Goals Re-Evaluation : Exercise Goals Re-Evaluation    Row Name 06/11/17 1517 10/26/17 0643 11/23/17 0947         Exercise Goal Re-Evaluation   Exercise Goals Review  Increase Strength and Stamina;Able to understand and use Dyspnea scale;Able to understand and use rate of perceived exertion (RPE) scale;Increase Physical Activity;Knowledge and understanding of Target Heart Rate Range (THRR);Understanding of Exercise Prescription  Increase Strength and Stamina;Able to understand and use Dyspnea scale;Able to understand and use rate of perceived exertion (RPE) scale;Increase Physical Activity;Knowledge and understanding of Target Heart Rate Range (THRR);Understanding of Exercise Prescription  Increase Strength and Stamina;Able to understand and use Dyspnea scale;Able to understand and use rate of perceived exertion (RPE) scale;Increase Physical Activity;Knowledge and understanding of Target Heart Rate Range (THRR);Understanding of Exercise Prescription  Comments  The patient has been motivated to make changes. However, now she is out of rehab due to the shingles. Patient is able to walk up to 9 laps (200 ft) in 15 minutes. Will cont. to monitor and progress as able.  Patient has had a slow start to rehab.  Very tearful. Somedays patient can only do non-weight bearing exercise due to pain. Her MET level places her in a low level. Will cont. to monitor and progress as able.   Patient is progressing slowly. Major barriers are chronic pain. She walks the track even though it is very difficult for her-this shows me she is trying to progress. Met level places her in a low level--I will discuss with patient how to increase levels. Will discuss home exercise and its importance. Cont. to monitor and motivate.     Expected Outcomes  Through exercise at rehab and at home, patient will increase strength and stamina making ADL's easier to perform. Patient will also have a better understanding of safe exercise and what they are capable to do outside of clinical supervision.  Through exercise at rehab and at home, patient will increase strength and stamina making ADL's easier to perform. Patient will also have a better understanding of safe exercise and what they are capable to do outside of clinical supervision.  Through exercise at rehab and at home, patient will increase strength and stamina making ADL's easier to perform. Patient will also have a better understanding of safe exercise and what they are capable to do outside of clinical supervision.        Discharge Exercise Prescription (Final Exercise Prescription Changes): Exercise Prescription Changes - 11/23/17 1600      Response to Exercise   Blood Pressure (Admit)  120/60    Blood Pressure (Exercise)  128/80    Blood Pressure (Exit)  112/78    Heart Rate (Admit)  81 bpm    Heart Rate (Exercise)  118 bpm    Heart Rate (Exit)  94 bpm    Oxygen Saturation (Admit)  91 %    Oxygen Saturation (Exercise)  93 %    Oxygen Saturation (Exit)  95 %    Rating of Perceived Exertion (Exercise)  19    Perceived Dyspnea (Exercise)  4    Duration  Progress to 45 minutes of aerobic exercise without signs/symptoms of physical distress    Intensity  THRR unchanged       Progression   Progression  Continue to progress workloads to maintain intensity without signs/symptoms of physical distress.      Resistance Training   Training Prescription  Yes    Weight  blue bands    Reps  10-15    Time  10 Minutes      Interval Training   Interval Training  No      NuStep   Level  5    SPM  80    Minutes  34    METs  2.1      Track   Laps  9    Minutes  17       Nutrition:  Target Goals: Understanding of nutrition guidelines, daily intake of sodium <1572m, cholesterol <2025m calories 30% from fat and 7% or less from saturated fats, daily to have 5 or more servings of fruits and vegetables.  Biometrics:    Nutrition Therapy Plan and Nutrition Goals: Nutrition Therapy & Goals - 10/05/17 1551      Nutrition Therapy   Diet  Carb Modified, Heart Healthy      Personal Nutrition Goals   Nutrition Goal  Identify food quantities necessary to achieve wt loss of  -2# per week to a goal wt loss of 6-24 lb at graduation from pulmonary rehab.    Personal Goal #2  personal goal for CBG's in the normal range not met, as identified by CBG's pre and post exercise      Intervention Plan   Intervention  Prescribe, educate and counsel regarding individualized specific dietary modifications aiming towards targeted core components such as weight, hypertension, lipid management, diabetes, heart failure and other comorbidities.    Expected Outcomes  Short Term Goal: Understand basic principles of dietary content, such as calories, fat, sodium, cholesterol and nutrients.;Long Term Goal: Adherence to prescribed nutrition plan.       Nutrition Assessments: Nutrition Assessments - 10/05/17 1556      Rate Your Plate Scores   Pre Score  43       Nutrition Goals Re-Evaluation: Nutrition Goals Re-Evaluation    Row Name 08/20/17 1016 08/20/17 1019 10/05/17 1551         Goals   Current Weight  273 lb 13 oz (124.2 kg)  273 lb 13 oz (124.2 kg)  270 lb (122.5 kg)      Nutrition Goal  Identify food quantities necessary to achieve wt loss of  -2# per week to a goal wt loss of 6-24 lb at graduation from pulmonary rehab.  Identify food quantities necessary to achieve wt loss of  -2# per week to a goal wt loss of 6-24 lb at graduation from pulmonary rehab.  -     Comment  pt maintained weight, weight loss goal not met  pt maintained weight, weight loss goal not met  -       Personal Goal #2 Re-Evaluation   Personal Goal #2  CBG's in the normal range or as close to normal as is safely possible.  personal goal for CBG's in the normal range not met, as identified by CBG's pre and post exercise  -        Nutrition Goals Discharge (Final Nutrition Goals Re-Evaluation): Nutrition Goals Re-Evaluation - 10/05/17 1551      Goals   Current Weight  270 lb (122.5 kg)       Psychosocial: Target Goals: Acknowledge presence or absence of significant depression and/or stress, maximize coping skills, provide positive support system. Participant is able to verbalize types and ability to use techniques and skills needed for reducing stress and depression.  Initial Review & Psychosocial Screening: Initial Psych Review & Screening - 09/13/17 1444      Initial Review   Current issues with  Current Depression;Current Stress Concerns    Source of Stress Concerns  Unable to participate in former interests or hobbies;Chronic Illness;Unable to perform yard/household activities      [place]?  Yes      Barriers   Psychosocial barriers to participate in program  The patient should benefit from training in stress management and relaxation.      Screening Interventions   Interventions  Encouraged to exercise    Expected Outcomes  Long Term Goal: Stressors or current issues are controlled or eliminated.;Short Term goal: Identification and review with participant of any Quality of Life or Depression concerns found by scoring the questionnaire.;Long Term  goal: The participant improves quality of Life and PHQ9 Scores as seen by post scores and/or verbalization of changes  Quality of Life Scores:  Scores of 19 and below usually indicate a poorer quality of life in these areas.  A difference of  2-3 points is a clinically meaningful difference.  A difference of 2-3 points in the total score of the Quality of Life Index has been associated with significant improvement in overall quality of life, self-image, physical symptoms, and general health in studies assessing change in quality of life.  PHQ-9: Recent Review Flowsheet Data    Depression screen [facility] 2/9 09/13/2017 03/29/2017   Decreased Interest 3 3   Down, Depressed, Hopeless 3 2   PHQ - 2 Score 6 5   Altered sleeping 3 3   Tired, decreased energy 3 3   Change in appetite 1 1   Feeling bad or failure about yourself  3 3   Trouble concentrating 3 3   Moving slowly or fidgety/restless 3 2   Suicidal thoughts 0 0   PHQ-9 Score 22 20   Difficult doing work/chores Very difficult Extremely dIfficult     Interpretation of Total Score  Total Score Depression Severity:  1-4 = Minimal depression, 5-9 = Mild depression, 10-14 = Moderate depression, 15-19 = Moderately severe depression, 20-27 = Severe depression   Psychosocial Evaluation and Intervention: Psychosocial Evaluation - 11/24/17 1707      Psychosocial Evaluation & Interventions   Interventions  Encouraged to exercise with the program and follow exercise prescription    Comments  Pt with improved disposition.  Pt seems to be coping better with her personal situation.  Pt outlook remains somewhat negative at times mainly due to her furstration with medical care.    Expected Outcomes  Pt will report less to none tearful episodes.  Less dependency on others to help with ADL' such as cooking, house cleaning and laundry. Pt will have have positive outlook and healthy coping skills.     Continue Psychosocial Services   Follow up  required by staff       Psychosocial Re-Evaluation: Psychosocial Re-Evaluation    Dona Ana Name 06/14/17 1630 10/26/17 0848 11/24/17 1709         Psychosocial Re-Evaluation   Current issues with  Current Depression;History of Depression  Current Depression;History of Depression  Current Depression;History of Depression     Comments  No change re: depression since orientation.  Is on medication for depression and is stable at this time.  No change re: depression since orientation.  Pt PCP continues to tweak pt medications.  This has been difficult for pt.  Pt reports mood swings and tearful episodes over very little issues.  Pt attended Meditation and mindfulness education class.  Pt with less mood swings and tearful episodes.     Expected Outcomes  patient will remain free from psychosocial barriers to participation in pulmonary rehab program  patient will remain free from psychosocial barriers to participation in pulmonary rehab program  patient will remain free from psychosocial barriers to participation in pulmonary rehab program     Interventions  Encouraged to attend Pulmonary Rehabilitation for the exercise  Encouraged to attend Pulmonary Rehabilitation for the exercise  Encouraged to attend Pulmonary Rehabilitation for the exercise;Stress management education;Relaxation education     Continue Psychosocial Services   Follow up required by staff  Follow up required by staff  Follow up required by staff        Psychosocial Discharge (Final Psychosocial Re-Evaluation): Psychosocial Re-Evaluation - 11/24/17 1709      Psychosocial Re-Evaluation   Current issues with  Current Depression;History of Depression    Comments  Pt with less mood swings and tearful episodes.    Expected Outcomes  patient will remain free from psychosocial barriers to participation in pulmonary rehab program    Interventions  Encouraged to attend Pulmonary Rehabilitation for the exercise;Stress management  education;Relaxation education    Continue Psychosocial Services   Follow up required by staff       Education: Education Goals: Education classes will be provided on a weekly basis, covering required topics. Participant will state understanding/return demonstration of topics presented.  Learning Barriers/Preferences: Learning Barriers/Preferences - 09/13/17 1612      Learning Barriers/Preferences   Learning Barriers  Sight    Learning Preferences  Computer/Internet;Written Material;Video;Verbal Instruction;Skilled Demonstration;Pictoral;Individual Instruction       Education Topics: Risk Factor Reduction:  -Group instruction that is supported by a PowerPoint presentation. Instructor discusses the definition of a risk factor, different risk factors for pulmonary disease, and how the heart and lungs work together.     Nutrition for Pulmonary Patient:  -Group instruction provided by PowerPoint slides, verbal discussion, and written materials to support subject matter. The instructor gives an explanation and review of healthy diet recommendations, which includes a discussion on weight management, recommendations for fruit and vegetable consumption, as well as protein, fluid, caffeine, fiber, sodium, sugar, and alcohol. Tips for eating when patients are short of breath are discussed.   PULMONARY REHAB OTHER RESPIRATORY from 11/04/2017 in [place]  Date  11/04/17  Educator  Rodman Pickle  Instruction Review Code  2- Demonstrated Understanding      Pursed Lip Breathing:  -Group instruction that is supported by demonstration and informational handouts. Instructor discusses the benefits of pursed lip and diaphragmatic breathing and detailed demonstration on how to preform both.     Oxygen Safety:  -Group instruction provided by PowerPoint, verbal discussion, and written material to support subject matter. There is an overview of "What is Oxygen" and "Why do we  need it".  Instructor also reviews how to create a safe environment for oxygen use, the importance of using oxygen as prescribed, and the risks of noncompliance. There is a brief discussion on traveling with oxygen and resources the patient may utilize.   PULMONARY REHAB OTHER RESPIRATORY from 11/04/2017 in [place]  Date  04/15/17  Educator  RN  Instruction Review Code  2- Demonstrated Understanding      Oxygen Equipment:  -Group instruction provided by [facility] Staff utilizing handouts, written materials, and equipment demonstrations.   Signs and Symptoms:  -Group instruction provided by written material and verbal discussion to support subject matter. Warning signs and symptoms of infection, stroke, and heart attack are reviewed and when to call the physician/911 reinforced. Tips for preventing the spread of infection discussed.   Advanced Directives:  -Group instruction provided by verbal instruction and written material to support subject matter. Instructor reviews Advanced Directive laws and proper instruction for filling out document.   Pulmonary Video:  -Group video education that reviews the importance of medication and oxygen compliance, exercise, good nutrition, pulmonary hygiene, and pursed lip and diaphragmatic breathing for the pulmonary patient.   Exercise for the Pulmonary Patient:  -Group instruction that is supported by a PowerPoint presentation. Instructor discusses benefits of exercise, core components of exercise, frequency, duration, and intensity of an exercise routine, importance of utilizing pulse oximetry during exercise, safety while exercising, and options of places to exercise outside of rehab.  PULMONARY REHAB OTHER RESPIRATORY from 11/04/2017 in [place]  Date  10/07/17  Instruction Review Code  1- Verbalizes Understanding      Pulmonary Medications:  -Verbally interactive group  education provided by instructor with focus on inhaled medications and proper administration.   PULMONARY REHAB OTHER RESPIRATORY from 11/04/2017 in [place]  Date  06/03/17  Educator  Pharmacist  Instruction Review Code  1- Verbalizes Understanding      Anatomy and Physiology of the Respiratory System and Intimacy:  -Group instruction provided by PowerPoint, verbal discussion, and written material to support subject matter. Instructor reviews respiratory cycle and anatomical components of the respiratory system and their functions. Instructor also reviews differences in obstructive and restrictive respiratory diseases with examples of each. Intimacy, Sex, and Sexuality differences are reviewed with a discussion on how relationships can change when diagnosed with pulmonary disease. Common sexual concerns are reviewed.   PULMONARY REHAB OTHER RESPIRATORY from 11/04/2017 in [place]  Date  10/28/17  Educator  RN  Instruction Review Code  2- Demonstrated Understanding      MD DAY -A group question and answer session with a medical doctor that allows participants to ask questions that relate to their pulmonary disease state.   OTHER EDUCATION -Group or individual verbal, written, or video instructions that support the educational goals of the pulmonary rehab program.   Holiday Eating Survival Tips:  -Group instruction provided by PowerPoint slides, verbal discussion, and written materials to support subject matter. The instructor gives patients tips, tricks, and techniques to help them not only survive but enjoy the holidays despite the onslaught of food that accompanies the holidays.   Knowledge Questionnaire Score: Knowledge Questionnaire Score - 10/13/17 1407      Knowledge Questionnaire Score   Pre Score  18/18       Core Components/Risk Factors/Patient Goals at Admission: Personal Goals and Risk Factors at  Admission - 09/13/17 1420      Core Components/Risk Factors/Patient Goals on Admission    Weight Management  Yes;Weight Loss    Intervention  Weight Management/Obesity: Establish reasonable short term and long term weight goals.;Obesity: Provide education and appropriate resources to help participant work on and attain dietary goals.;Weight Management: Provide education and appropriate resources to help participant work on and attain dietary goals.;Weight Management: Develop a combined nutrition and exercise program designed to reach desired caloric intake, while maintaining appropriate intake of nutrient and fiber, sodium and fats, and appropriate energy expenditure required for the weight goal.    Admit Weight  270 lb 15.1 oz (122.9 kg)    Goal Weight: Short Term  260 lb (117.9 kg)    Goal Weight: Long Term  250 lb (113.4 kg)    Expected Outcomes  Long Term: Adherence to nutrition and physical activity/exercise program aimed toward attainment of established weight goal;Weight Loss: Understanding of general recommendations for a balanced deficit meal plan, which promotes 1-2 lb weight loss per week and includes a negative energy balance of [id number] kcal/d;Understanding recommendations for meals to include 15-35% energy as protein, 25-35% energy from fat, 35-60% energy from carbohydrates, less than 257m of dietary cholesterol, 20-35 gm of total fiber daily;Understanding of distribution of calorie intake throughout the day with the consumption of 4-5 meals/snacks    Improve shortness of breath with ADL's  Yes    Intervention  Provide education, individualized exercise plan and daily activity instruction to help decrease symptoms of SOB  with activities of daily living.    Expected Outcomes  Short Term: Improve cardiorespiratory fitness to achieve a reduction of symptoms when performing ADLs;Long Term: Be able to perform more ADLs without symptoms or delay the onset of symptoms    Heart Failure  Yes     Intervention  Provide a combined exercise and nutrition program that is supplemented with education, support and counseling about heart failure. Directed toward relieving symptoms such as shortness of breath, decreased exercise tolerance, and extremity edema.    Expected Outcomes  Improve functional capacity of life;Short term: Attendance in program 2-3 days a week with increased exercise capacity. Reported lower sodium intake. Reported increased fruit and vegetable intake. Reports medication compliance.;Short term: Daily weights obtained and reported for increase. Utilizing diuretic protocols set by physician.;Long term: Adoption of self-care skills and reduction of barriers for early signs and symptoms recognition and intervention leading to self-care maintenance.    Stress  Yes    Intervention  Offer individual and/or small group education and counseling on adjustment to heart disease, stress management and health-related lifestyle change. Teach and support self-help strategies.;Refer participants experiencing significant psychosocial distress to appropriate mental health specialists for further evaluation and treatment. When possible, include family members and significant others in education/counseling sessions.    Expected Outcomes  Short Term: Participant demonstrates changes in health-related behavior, relaxation and other stress management skills, ability to obtain effective social support, and compliance with psychotropic medications if prescribed.;Long Term: Emotional wellbeing is indicated by absence of clinically significant psychosocial distress or social isolation.       Core Components/Risk Factors/Patient Goals Review:  Goals and Risk Factor Review    Row Name 06/14/17 1622 10/26/17 0851 10/26/17 0852 10/27/17 0955 11/24/17 1711     Core Components/Risk Factors/Patient Goals Review   Personal Goals Review  Weight Management/Obesity;Improve shortness of breath with ADL's;Develop more  efficient breathing techniques such as purse lipped breathing and diaphragmatic breathing and practicing self-pacing with activity.;Stress  Weight Management/Obesity;Improve shortness of breath with ADL's;Develop more efficient breathing techniques such as purse lipped breathing and diaphragmatic breathing and practicing self-pacing with activity.;Stress  Weight Management/Obesity;Improve shortness of breath with ADL's;Heart Failure;Stress;Diabetes  Weight Management/Obesity;Improve shortness of breath with ADL's;Heart Failure;Stress;Diabetes;Increase knowledge of respiratory medications and ability to use respiratory devices properly.;Develop more efficient breathing techniques such as purse lipped breathing and diaphragmatic breathing and practicing self-pacing with activity.  Weight Management/Obesity;Improve shortness of breath with ADL's;Heart Failure;Stress;Diabetes;Increase knowledge of respiratory medications and ability to use respiratory devices properly.;Develop more efficient breathing techniques such as purse lipped breathing and diaphragmatic breathing and practicing self-pacing with activity.   Review  patient continues to have medical issues that interfere with attendance to pulmonary rehab. as of last week, patient was diagnosed with shingles. she will not return this week and we will readdress on Monday of next. she has not lost any weight however she has not changed her eating habits related to other medical issues that has taken precidences. she works hard when she is in attendance and has even self increased workloads on equiptment. as of now she has not shown much progression towards pulmonary goals. expect to see better progress towards pulmonary rehab goals over the next 30 days if she is able to have consistant attendance.  Kathleen Hatfield has completed 5 exercise classess with one incomplete due to elevation in CBG over 300.  Weight:  pt has shown increase in her weight of 1 kg since starting on  7/30.  This is due  to pt inablitiy to increase her physical actiivty outside of Pulmonary rehab..  This is do in part with pt significant history of depression. Pt met with dietician on 8/13. PCP is tweaking her medications and this has been a difficult transition for pt.  Continue to encourage and support pt. Pt with inrease in nustep level to 4, Track remains at 6 laps in a 15 minute station. Arm crank is level 1.  Will try to increase pt workloads and have pt engage in activities that will provide pt opportunities for movement at home.  Expect to see some progress twoard pulmonary rehab goals over the next 30 days.  Kathleen Hatfield has completed 14 exercise classess with one incomplete due to elevation in CBG over 300.  Weight:  pt has shown decreaseof 1.3 in her weight since starting on 7/30.  This is due to pt inablitiy to increase her physical actiivty outside of Pulmonary rehab..  This is do in part with pt significant history of depression. and deconditioning. Pt met with dietician on 8/13. Hopeful we will begin to see measureable progress toward her goal.  Continue to encourage and support pt. Pt with increase in nustep level to 5, for 30 minutesTrack increase to 9 laps in a 15 minute station.  Will try to increase pt workloads and have pt engage in activities that will provide pt opportunities for movement at home and increase social interaction.  Expect to see some progress twoard pulmonary rehab goals over the next 30 days.   Expected Outcomes  see "admission" expected outcomes  see "admission" expected outcomes  see "admission" expected outcomes  See Admission Outcomes/Goals  See Admission Outcomes/Goals      Core Components/Risk Factors/Patient Goals at Discharge (Final Review):  Goals and Risk Factor Review - 11/24/17 1711      Core Components/Risk Factors/Patient Goals Review   Personal Goals Review  Weight Management/Obesity;Improve shortness of breath with ADL's;Heart  Failure;Stress;Diabetes;Increase knowledge of respiratory medications and ability to use respiratory devices properly.;Develop more efficient breathing techniques such as purse lipped breathing and diaphragmatic breathing and practicing self-pacing with activity.    Review  Kathleen Hatfield has completed 14 exercise classess with one incomplete due to elevation in CBG over 300.  Weight:  pt has shown decreaseof 1.3 in her weight since starting on 7/30.  This is due to pt inablitiy to increase her physical actiivty outside of Pulmonary rehab..  This is do in part with pt significant history of depression. and deconditioning. Pt met with dietician on 8/13. Hopeful we will begin to see measureable progress toward her goal.  Continue to encourage and support pt. Pt with increase in nustep level to 5, for 30 minutesTrack increase to 9 laps in a 15 minute station.  Will try to increase pt workloads and have pt engage in activities that will provide pt opportunities for movement at home and increase social interaction.  Expect to see some progress twoard pulmonary rehab goals over the next 30 days.    Expected Outcomes  See Admission Outcomes/Goals       ITP Comments: ITP Comments    Row Name 09/13/17 1349 10/27/17 0955 11/24/17 1707       ITP Comments  Dr. Jennet Maduro, Medical Director  Dr. Jennet Maduro, Medical Director  Dr. Jennet Maduro, Medical Director Pulmonary Rehab        Comments: Pt has completed 14 exercise sessions.  Continue to monitor. Kathleen Hatfield, BSN Cardiac and Training and development officer

## 2017-11-25 ENCOUNTER — Encounter (HOSPITAL_COMMUNITY): Payer: Medicare HMO

## 2017-11-25 ENCOUNTER — Encounter (HOSPITAL_COMMUNITY)
Admission: RE | Admit: 2017-11-25 | Discharge: 2017-11-25 | Disposition: A | Discharge status: Home / Self Care | Payer: Medicare HMO | Source: Ambulatory Visit | Attending: Pediatrics | Admitting: Pediatrics

## 2017-11-25 DIAGNOSIS — J449 Chronic obstructive pulmonary disease, unspecified: Secondary | ICD-10-CM

## 2017-11-25 DIAGNOSIS — J45902 Unspecified asthma with status asthmaticus: Secondary | ICD-10-CM

## 2017-11-25 NOTE — Progress Notes (Signed)
Daily Session Note  Patient Details  Name: Kathleen Hatfield MRN: [id number] Date of Birth: Aug 08, 1955 Referring Provider:     Pulmonary Rehab Walk Test from 09/28/2017 in [place]  Referring Provider  Dr. Halford Chessman      Encounter Date: 11/25/2017  Check In: Session Check In - 11/25/17 1534      Check-In   Supervising physician immediately available to respond to emergencies  [facility] immediately available    Physician(s)  Dr. Toma Copier    Location  [facility]    Staff Present  Maurice Small, RN, BSN;Dillon Livermore, MS, ACSM RCEP, Exercise Physiologist;Lisa Ysidro Evert, Felipe Drone, RN, MHA    Medication changes reported      No    Fall or balance concerns reported     No    Tobacco Cessation  No Change    Warm-up and Cool-down  Performed as group-led instruction    Resistance Training Performed  Yes    VAD Patient?  No    PAD/SET Patient?  No      Pain Assessment   Currently in Pain?  No/denies    Pain Score  0-No pain    Multiple Pain Sites  No       Capillary Blood Glucose: No results found for this or any previous visit (from the past 24 hour(s)).    Social History   Tobacco Use  Smoking Status Never Smoker  Smokeless Tobacco Never Used    Goals Met:  Exercise tolerated well  Goals Unmet:  Not Applicable  Comments: Service time is from 1:30p to 3:24p    Dr. Rush Farmer is Medical Director for Pulmonary Rehab at [facility].

## 2017-11-26 NOTE — Progress Notes (Signed)
I have reviewed a Home Exercise Prescription with Kathleen Hatfield . [name] is not currently exercising at home.  The patient was advised to walk 2 days a week for 30 minutes.  [name] and I discussed how to progress their exercise prescription.  The patient stated that their goals were to feel safe going out in public and develop a regular exercise regime.  The patient stated that they understand the exercise prescription.  We reviewed exercise guidelines, target heart rate during exercise, RPE Scale, weather conditions, NTG use, endpoints for exercise, warmup and cool down.  Patient is encouraged to come to me with any questions. I will continue to follow up with the patient to assist them with progression and safety.

## 2017-11-30 ENCOUNTER — Encounter (HOSPITAL_COMMUNITY)
Admission: RE | Admit: 2017-11-30 | Discharge: 2017-11-30 | Disposition: A | Discharge status: Home / Self Care | Payer: Medicare HMO | Source: Ambulatory Visit | Attending: Pulmonary Disease | Admitting: Pulmonary Disease

## 2017-11-30 ENCOUNTER — Encounter (HOSPITAL_COMMUNITY): Payer: Medicare HMO

## 2017-11-30 DIAGNOSIS — J45902 Unspecified asthma with status asthmaticus: Secondary | ICD-10-CM

## 2017-11-30 DIAGNOSIS — J449 Chronic obstructive pulmonary disease, unspecified: Secondary | ICD-10-CM | POA: Diagnosis not present

## 2017-11-30 NOTE — Progress Notes (Signed)
Daily Session Note  Patient Details  Name: Kathleen Hatfield MRN: [id number] Date of Birth: January 22, 1956 Referring Provider:     Pulmonary Rehab Walk Test from 09/28/2017 in [place]  Referring Provider  Dr. Halford Chessman      Encounter Date: 11/30/2017  Check In: Session Check In - 11/30/17 1515      Check-In   Supervising physician immediately available to respond to emergencies  [facility] immediately available    Physician(s)  Dr.Danford     Location  [facility]    Staff Present  Maurice Small, RN, BSN;Amanii Snethen, MS, ACSM RCEP, Exercise Physiologist;Annedrea Stackhouse, RN, MHA;Maria Whitaker, RN, BSN    Medication changes reported      No    Fall or balance concerns reported     No    Tobacco Cessation  No Change    Warm-up and Cool-down  Performed as group-led Higher education careers adviser Performed  Yes    VAD Patient?  No    PAD/SET Patient?  No      Pain Assessment   Currently in Pain?  No/denies       Capillary Blood Glucose: No results found for this or any previous visit (from the past 24 hour(s)).    Social History   Tobacco Use  Smoking Status Never Smoker  Smokeless Tobacco Never Used    Goals Met:  Exercise tolerated well  Goals Unmet:  Not Applicable  Comments: Service time is from 1:30p to 3:15p    Dr. Rush Farmer is Medical Director for Pulmonary Rehab at [facility].

## 2017-12-02 ENCOUNTER — Encounter (HOSPITAL_COMMUNITY)
Admission: RE | Admit: 2017-12-02 | Discharge: 2017-12-02 | Disposition: A | Discharge status: Home / Self Care | Payer: Medicare HMO | Source: Ambulatory Visit | Attending: Pulmonary Disease | Admitting: Pulmonary Disease

## 2017-12-02 ENCOUNTER — Encounter (HOSPITAL_COMMUNITY): Payer: Medicare HMO

## 2017-12-02 VITALS — Wt 273.1 lb

## 2017-12-02 DIAGNOSIS — J449 Chronic obstructive pulmonary disease, unspecified: Secondary | ICD-10-CM

## 2017-12-02 DIAGNOSIS — J45902 Unspecified asthma with status asthmaticus: Secondary | ICD-10-CM

## 2017-12-02 NOTE — Progress Notes (Signed)
Daily Session Note  Patient Details  Name: SUNDAE MANERS MRN: [id number] Date of Birth: 23-Apr-1955 Referring Provider:     Pulmonary Rehab Walk Test from 09/28/2017 in [place]  Referring Provider  Dr. Halford Chessman      Encounter Date: 12/02/2017  Check In: Session Check In - 12/02/17 1402      Check-In   Supervising physician immediately available to respond to emergencies  [facility] immediately available    Physician(s)  Dr.Danford     Location  [facility]    Staff Present  Maurice Small, RN, BSN;Enio Hornback, MS, ACSM RCEP, Exercise Physiologist;Annedrea Stackhouse, RN, MHA    Medication changes reported      No    Fall or balance concerns reported     No    Tobacco Cessation  No Change    Warm-up and Cool-down  Performed as group-led Higher education careers adviser Performed  Yes    VAD Patient?  No    PAD/SET Patient?  No      Pain Assessment   Currently in Pain?  No/denies    Multiple Pain Sites  No       Capillary Blood Glucose: No results found for this or any previous visit (from the past 24 hour(s)).    Social History   Tobacco Use  Smoking Status Never Smoker  Smokeless Tobacco Never Used    Goals Met:  Exercise tolerated well  Goals Unmet:  Not Applicable  Comments: Service time is from 1:30p to 3:30p    Dr. Rush Farmer is Medical Director for Pulmonary Rehab at [facility].

## 2017-12-06 DIAGNOSIS — E1129 Type 2 diabetes mellitus with other diabetic kidney complication: Secondary | ICD-10-CM | POA: Diagnosis not present

## 2017-12-06 DIAGNOSIS — J449 Chronic obstructive pulmonary disease, unspecified: Secondary | ICD-10-CM | POA: Diagnosis not present

## 2017-12-06 DIAGNOSIS — Z79899 Other long term (current) drug therapy: Secondary | ICD-10-CM | POA: Diagnosis not present

## 2017-12-06 DIAGNOSIS — E782 Mixed hyperlipidemia: Secondary | ICD-10-CM | POA: Diagnosis not present

## 2017-12-07 ENCOUNTER — Encounter (HOSPITAL_COMMUNITY)
Admission: RE | Admit: 2017-12-07 | Discharge: 2017-12-07 | Disposition: A | Discharge status: Home / Self Care | Payer: Medicare HMO | Source: Ambulatory Visit | Attending: Pulmonary Disease | Admitting: Pulmonary Disease

## 2017-12-07 ENCOUNTER — Encounter (HOSPITAL_COMMUNITY): Payer: Medicare HMO

## 2017-12-07 DIAGNOSIS — J449 Chronic obstructive pulmonary disease, unspecified: Secondary | ICD-10-CM | POA: Insufficient documentation

## 2017-12-07 NOTE — Progress Notes (Signed)
Daily Session Note  Patient Details  Name: Kathleen Hatfield MRN: [id number] Date of Birth: 07/26/1955 Referring Provider:     Pulmonary Rehab Walk Test from 09/28/2017 in [facility]  Referring Provider  Dr. Sood      Encounter Date: 12/02/2017  Check In:   Capillary Blood Glucose: No results found for this or any previous visit (from the past 24 hour(s)). POCT Glucose - 12/07/17 1649      POCT Blood Glucose   Pre-Exercise  127 mg/dL    Post-Exercise  97 mg/dL      Exercise Prescription Changes - 12/07/17 1600      Response to Exercise   Blood Pressure (Admit)  122/80    Blood Pressure (Exercise)  172/56    Blood Pressure (Exit)  142/78    Heart Rate (Admit)  79 bpm    Heart Rate (Exercise)  106 bpm    Heart Rate (Exit)  99 bpm    Oxygen Saturation (Admit)  94 %    Oxygen Saturation (Exercise)  92 %    Oxygen Saturation (Exit)  99 %    Rating of Perceived Exertion (Exercise)  15    Perceived Dyspnea (Exercise)  2    Duration  Continue with 45 min of aerobic exercise without signs/symptoms of physical distress.    Intensity  THRR unchanged      Resistance Training   Training Prescription  Yes    Weight  blue bands    Reps  10-15      NuStep   Level  4    SPM  80    Minutes  17    METs  2.1      Track   Laps  8    Minutes  17       Social History   Tobacco Use  Smoking Status Never Smoker  Smokeless Tobacco Never Used    Goals Met:  Exercise tolerated well  Goals Unmet:  Not Applicable  Comments: Service time is from 1:30p to 3:15p    Dr. Wesam G. Yacoub is Medical Director for Pulmonary Rehab at [facility]. 

## 2017-12-07 NOTE — Progress Notes (Signed)
Daily Session Note  Patient Details  Name: Kathleen Hatfield MRN: [id number] Date of Birth: 10-03-1955 Referring Provider:     Pulmonary Rehab Walk Test from 09/28/2017 in [place]  Referring Provider  Dr. Halford Chessman      Encounter Date: 12/07/2017  Check In: Session Check In - 12/07/17 1449      Check-In   Supervising physician immediately available to respond to emergencies  [facility] immediately available    Physician(s)  Dr. Louanne Belton    Location  [facility]    Staff Present  Maurice Small, RN, BSN;Catrice Zuleta, MS, ACSM RCEP, Exercise Physiologist;Annedrea Rosezella [place], RN, Ramonita Lab, RN    Medication changes reported      No    Fall or balance concerns reported     No    Tobacco Cessation  No Change    Warm-up and Cool-down  Performed as group-led instruction    Resistance Training Performed  Yes    VAD Patient?  No    PAD/SET Patient?  No      Pain Assessment   Currently in Pain?  No/denies       Capillary Blood Glucose: No results found for this or any previous visit (from the past 24 hour(s)).    Social History   Tobacco Use  Smoking Status Never Smoker  Smokeless Tobacco Never Used    Goals Met:  Exercise tolerated well  Goals Unmet:  Not Applicable  Comments: Service time is from 1:30p to 3:15p    Dr. Rush Farmer is Medical Director for Pulmonary Rehab at [facility].

## 2017-12-08 DIAGNOSIS — Z23 Encounter for immunization: Secondary | ICD-10-CM | POA: Diagnosis not present

## 2017-12-09 ENCOUNTER — Encounter (HOSPITAL_COMMUNITY)
Admission: RE | Admit: 2017-12-09 | Discharge: 2017-12-09 | Disposition: A | Discharge status: Home / Self Care | Payer: Medicare HMO | Source: Ambulatory Visit | Attending: Pulmonary Disease | Admitting: Pulmonary Disease

## 2017-12-09 ENCOUNTER — Encounter (HOSPITAL_COMMUNITY): Payer: Medicare HMO

## 2017-12-09 DIAGNOSIS — J45902 Unspecified asthma with status asthmaticus: Secondary | ICD-10-CM

## 2017-12-09 DIAGNOSIS — J449 Chronic obstructive pulmonary disease, unspecified: Secondary | ICD-10-CM

## 2017-12-09 NOTE — Progress Notes (Signed)
Daily Session Note  Patient Details  Name: Kathleen Hatfield MRN: [id number] Date of Birth: 09/21/1955 Referring Provider:     Pulmonary Rehab Walk Test from 09/28/2017 in [place]  Referring Provider  Dr. Halford Chessman      Encounter Date: 12/09/2017  Check In: Session Check In - 12/09/17 1353      Check-In   Supervising physician immediately available to respond to emergencies  [facility] immediately available    Physician(s)  Dr. Horris Latino    Location  [facility]    Staff Present  Maurice Small, RN, BSN;Molly DiVincenzo, MS, ACSM RCEP, Exercise Physiologist;Namish Krise Ysidro Evert, Felipe Drone, RN, MHA    Medication changes reported      No    Fall or balance concerns reported     No    Tobacco Cessation  No Change    Warm-up and Cool-down  Performed as group-led instruction    Resistance Training Performed  Yes    VAD Patient?  No    PAD/SET Patient?  No      Pain Assessment   Currently in Pain?  No/denies    Pain Score  0-No pain    Multiple Pain Sites  No       Capillary Blood Glucose: No results found for this or any previous visit (from the past 24 hour(s)).    Social History   Tobacco Use  Smoking Status Never Smoker  Smokeless Tobacco Never Used    Goals Met:  Exercise tolerated well No report of cardiac concerns or symptoms Strength training completed today  Goals Unmet:  Not Applicable  Comments: Service time is from 1330 to 1530    Dr. Rush Farmer is Medical Director for Pulmonary Rehab at [facility].

## 2017-12-13 DIAGNOSIS — G4733 Obstructive sleep apnea (adult) (pediatric): Secondary | ICD-10-CM | POA: Diagnosis not present

## 2017-12-13 DIAGNOSIS — Z79899 Other long term (current) drug therapy: Secondary | ICD-10-CM | POA: Diagnosis not present

## 2017-12-14 ENCOUNTER — Encounter (HOSPITAL_COMMUNITY): Payer: Medicare HMO

## 2017-12-14 ENCOUNTER — Encounter (HOSPITAL_COMMUNITY)
Admission: RE | Admit: 2017-12-14 | Discharge: 2017-12-14 | Disposition: A | Discharge status: Home / Self Care | Payer: Medicare HMO | Source: Ambulatory Visit | Attending: Pulmonary Disease | Admitting: Pulmonary Disease

## 2017-12-14 DIAGNOSIS — J449 Chronic obstructive pulmonary disease, unspecified: Secondary | ICD-10-CM

## 2017-12-14 DIAGNOSIS — J45902 Unspecified asthma with status asthmaticus: Secondary | ICD-10-CM

## 2017-12-14 NOTE — Progress Notes (Signed)
Daily Session Note  Patient Details  Name: Kathleen Hatfield MRN: [id number] Date of Birth: 10/12/1955 Referring Provider:     Pulmonary Rehab Walk Test from 09/28/2017 in [place]  Referring Provider  Dr. Halford Chessman      Encounter Date: 12/14/2017  Check In: Session Check In - 12/14/17 1510      Check-In   Supervising physician immediately available to respond to emergencies  [facility] immediately available    Physician(s)  Dr. Horris Latino    Location  [facility]    Staff Present  Su Hilt, MS, ACSM RCEP, Exercise Physiologist;Alexsis Branscom Colletta [place], RN, MHA    Medication changes reported      No    Fall or balance concerns reported     No    Tobacco Cessation  No Change    Warm-up and Cool-down  Performed as group-led instruction    Resistance Training Performed  Yes    VAD Patient?  No    PAD/SET Patient?  No      Pain Assessment   Currently in Pain?  No/denies    Pain Score  0-No pain    Multiple Pain Sites  No       Capillary Blood Glucose: No results found for this or any previous visit (from the past 24 hour(s)).    Social History   Tobacco Use  Smoking Status Never Smoker  Smokeless Tobacco Never Used    Goals Met:  Exercise tolerated well No report of cardiac concerns or symptoms Strength training completed today  Goals Unmet:  Not Applicable  Comments: Service time is from 1330 to 1500    Dr. Rush Farmer is Medical Director for Pulmonary Rehab at [facility].

## 2017-12-16 ENCOUNTER — Encounter (HOSPITAL_COMMUNITY)
Admission: RE | Admit: 2017-12-16 | Discharge: 2017-12-16 | Disposition: A | Discharge status: Home / Self Care | Payer: Medicare HMO | Source: Ambulatory Visit | Attending: Pulmonary Disease | Admitting: Pulmonary Disease

## 2017-12-16 ENCOUNTER — Encounter (HOSPITAL_COMMUNITY): Payer: Medicare HMO

## 2017-12-16 DIAGNOSIS — J449 Chronic obstructive pulmonary disease, unspecified: Secondary | ICD-10-CM | POA: Diagnosis not present

## 2017-12-16 DIAGNOSIS — J45902 Unspecified asthma with status asthmaticus: Secondary | ICD-10-CM

## 2017-12-16 NOTE — Progress Notes (Signed)
Daily Session Note  Patient Details  Name: Kathleen Hatfield MRN: [id number] Date of Birth: 02/26/1956 Referring Provider:     Pulmonary Rehab Walk Test from 09/28/2017 in [place]  Referring Provider  Dr. Halford Chessman      Encounter Date: 12/16/2017  Check In: Session Check In - 12/16/17 1349      Check-In   Supervising physician immediately available to respond to emergencies  [facility] immediately available    Physician(s)  Dr. Herbert Moors    Location  [facility]    Staff Present  Su Hilt, MS, ACSM RCEP, Exercise Physiologist;Dalton Kris Mouton, MS, Exercise Physiologist;Analiah Drum Colletta [place], RN, MHA    Medication changes reported      No    Fall or balance concerns reported     No    Tobacco Cessation  No Change    Warm-up and Cool-down  Performed as group-led instruction    Resistance Training Performed  Yes    VAD Patient?  No    PAD/SET Patient?  No      Pain Assessment   Currently in Pain?  No/denies    Pain Score  0-No pain    Multiple Pain Sites  No       Capillary Blood Glucose: No results found for this or any previous visit (from the past 24 hour(s)).    Social History   Tobacco Use  Smoking Status Never Smoker  Smokeless Tobacco Never Used    Goals Met:  Exercise tolerated well No report of cardiac concerns or symptoms Strength training completed today  Goals Unmet:  Not Applicable  Comments: Service time is from 1330 to 1540    Dr. Rush Farmer is Medical Director for Pulmonary Rehab at [facility].

## 2017-12-21 ENCOUNTER — Encounter (HOSPITAL_COMMUNITY)
Admission: RE | Admit: 2017-12-21 | Discharge: 2017-12-21 | Disposition: A | Discharge status: Home / Self Care | Payer: Medicare HMO | Source: Ambulatory Visit | Attending: Pulmonary Disease | Admitting: Pulmonary Disease

## 2017-12-21 ENCOUNTER — Encounter (HOSPITAL_COMMUNITY): Payer: Medicare HMO

## 2017-12-21 VITALS — Wt 268.3 lb

## 2017-12-21 DIAGNOSIS — J449 Chronic obstructive pulmonary disease, unspecified: Secondary | ICD-10-CM

## 2017-12-21 DIAGNOSIS — J45902 Unspecified asthma with status asthmaticus: Secondary | ICD-10-CM

## 2017-12-21 NOTE — Progress Notes (Signed)
Daily Session Note  Patient Details  Name: Kathleen Hatfield MRN: [id number] Date of Birth: 1955/09/19 Referring Provider:     Pulmonary Rehab Walk Test from 09/28/2017 in [place]  Referring Provider  Dr. Halford Chessman      Encounter Date: 12/21/2017  Check In: Session Check In - 12/21/17 1338      Check-In   Supervising physician immediately available to respond to emergencies  [facility] immediately available    Physician(s)   Dr. Herbert Moors    Location  [facility]    Staff Present  Hoy Register, MS, Exercise Physiologist;Zamyia Gowell Ysidro Evert, Felipe Drone, RN, MHA;Molly DiVincenzo, MS, ACSM RCEP, Exercise Physiologist    Medication changes reported      No    Fall or balance concerns reported     No    Tobacco Cessation  No Change    Warm-up and Cool-down  Performed as group-led instruction    Resistance Training Performed  Yes    VAD Patient?  No    PAD/SET Patient?  No      Pain Assessment   Currently in Pain?  No/denies    Multiple Pain Sites  No       Capillary Blood Glucose: No results found for this or any previous visit (from the past 24 hour(s)). POCT Glucose - 12/21/17 1618      POCT Blood Glucose   Pre-Exercise  231 mg/dL    Post-Exercise  188 mg/dL      Exercise Prescription Changes - 12/21/17 1600      Response to Exercise   Blood Pressure (Admit)  124/70    Blood Pressure (Exercise)  124/64    Blood Pressure (Exit)  130/60    Heart Rate (Admit)  83 bpm    Heart Rate (Exercise)  99 bpm    Heart Rate (Exit)  89 bpm    Oxygen Saturation (Admit)  93 %    Oxygen Saturation (Exercise)  93 %    Oxygen Saturation (Exit)  95 %    Rating of Perceived Exertion (Exercise)  12    Perceived Dyspnea (Exercise)  2    Duration  Continue with 45 min of aerobic exercise without signs/symptoms of physical distress.    Intensity  THRR unchanged      Progression   Progression  Continue to progress workloads to  maintain intensity without signs/symptoms of physical distress.      Resistance Training   Training Prescription  Yes    Weight  blue bands    Reps  10-15    Time  10 Minutes      NuStep   Level  6    SPM  80    Minutes  51    METs  2       Social History   Tobacco Use  Smoking Status Never Smoker  Smokeless Tobacco Never Used    Goals Met:  Exercise tolerated well No report of cardiac concerns or symptoms Strength training completed today  Goals Unmet:  Not Applicable  Comments: Service time is from 1330 to 1500    Dr. Rush Farmer is Medical Director for Pulmonary Rehab at [facility].

## 2017-12-22 ENCOUNTER — Encounter (HOSPITAL_COMMUNITY): Payer: Self-pay | Admitting: *Deleted

## 2017-12-23 ENCOUNTER — Encounter (HOSPITAL_COMMUNITY)
Admission: RE | Admit: 2017-12-23 | Discharge: 2017-12-23 | Disposition: A | Discharge status: Home / Self Care | Payer: Medicare HMO | Source: Ambulatory Visit | Attending: Pulmonary Disease | Admitting: Pulmonary Disease

## 2017-12-23 ENCOUNTER — Encounter (HOSPITAL_COMMUNITY): Payer: Medicare HMO

## 2017-12-23 DIAGNOSIS — J449 Chronic obstructive pulmonary disease, unspecified: Secondary | ICD-10-CM | POA: Diagnosis not present

## 2017-12-23 DIAGNOSIS — J45902 Unspecified asthma with status asthmaticus: Secondary | ICD-10-CM

## 2017-12-23 NOTE — Progress Notes (Signed)
Daily Session Note  Patient Details  Name: SIMAR POTHIER MRN: [id number] Date of Birth: January 18, 1956 Referring Provider:     Pulmonary Rehab Walk Test from 09/28/2017 in [place]  Referring Provider  Dr. Halford Chessman      Encounter Date: 12/23/2017  Check In: Session Check In - 12/23/17 1330      Check-In   Supervising physician immediately available to respond to emergencies  [facility] immediately available    Physician(s)  Dr.Mikhal    Location  [facility]    Staff Present  Rosebud Poles, RN, BSN;Carlette Wilber Oliphant, RN, BSN;Molly DiVincenzo, MS, ACSM RCEP, Exercise Physiologist;Dalton Kris Mouton, MS, Exercise Physiologist;Lisa Ysidro Evert, Felipe Drone, RN, MHA    Medication changes reported      No    Fall or balance concerns reported     No    Tobacco Cessation  No Change    Warm-up and Cool-down  Performed as group-led instruction    Resistance Training Performed  Yes    VAD Patient?  No    PAD/SET Patient?  No      Pain Assessment   Currently in Pain?  No/denies    Multiple Pain Sites  No       Capillary Blood Glucose: No results found for this or any previous visit (from the past 24 hour(s)).    Social History   Tobacco Use  Smoking Status Never Smoker  Smokeless Tobacco Never Used    Goals Met:  Exercise tolerated well No report of cardiac concerns or symptoms Strength training completed today  Goals Unmet:  Not Applicable  Comments: Service time is from 1330  to 1505    Dr. Rush Farmer is Medical Director for Pulmonary Rehab at [facility].

## 2017-12-23 NOTE — Progress Notes (Signed)
Pulmonary Individual Treatment Plan  Patient Details  Name: Kathleen Hatfield MRN: [id number] Date of Birth: Oct 11, 1955 Referring Provider:     Pulmonary Rehab Walk Test from 09/28/2017 in [facility]  Referring Provider  Dr. 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  Aclidinium Bromide (TUDORZA PRESSAIR) 400 MCG/ACT AEPB, Inhale 1 puff into the lungs 2 (two) times daily., Disp: 1 each, Rfl: 5 .  albuterol (PROAIR HFA) 108 (90 Base) MCG/ACT inhaler, INHALE 2 PUFFS INTO THE LUNGS EVERY FOUR HOURS AS NEEDED FOR WHEEZING., Disp: 8.5 g, Rfl: 5 .  BAYER CONTOUR NEXT TEST test strip, As directed, Disp: , Rfl:  .  budesonide-formoterol (SYMBICORT) 160-4.5 MCG/ACT inhaler, Inhale 2 puffs into the lungs 2 (two) times daily., Disp: 1 Inhaler, Rfl: 5 .  Cholecalciferol (VITAMIN D-3) 5000 UNITS TABS, Take 5,000 Units by mouth daily. , Disp: , Rfl:  .  DULoxetine (CYMBALTA) 60 MG capsule, Take 60 mg by mouth daily. , Disp: , Rfl:  .  fenofibrate (TRICOR) 145 MG tablet, Take 145 mg by mouth at bedtime. , Disp: , Rfl: 11 .  furosemide (LASIX) 40 MG tablet, Take 40 mg by mouth daily. 09/13/17  Pt takes 1 1/2 daily, Disp: , Rfl:  .  gabapentin (NEURONTIN) 100 MG capsule, Take 200-400 mg by mouth See admin instructions. Take 3 capsules at bedtime, Disp: , Rfl:  .  GLOBAL EASE INJECT PEN NEEDLES 32G X 4 MM MISC, , Disp: , Rfl:  .  guaiFENesin-dextromethorphan (ROBITUSSIN DM) 100-10 MG/5ML syrup, Take 5 mLs by mouth every 6 (six) hours as needed for cough., Disp: 118 mL, Rfl: 0 .  Insulin Degludec (TRESIBA FLEXTOUCH) 200 UNIT/ML SOPN, Inject  74 Units into the skin daily., Disp: , Rfl:  .  Liraglutide (VICTOZA) 18 MG/3ML SOPN, Inject 1.8 mg into the skin daily with lunch. , Disp: , Rfl:  .  lisinopril (PRINIVIL,ZESTRIL) 10 MG tablet, Take 10 mg by mouth daily., Disp: , Rfl:  .  metolazone (ZAROXOLYN) 5 MG tablet, Take 5 mg by mouth daily as needed (Take with Lasix when weight is elevated 3 pounds overnight and 5 pounds or more for 2 days)., Disp: , Rfl:  .  MICROLET LANCETS MISC, As directed, Disp: , Rfl:  .  pantoprazole (PROTONIX) 40 MG tablet, Take 40 mg by mouth daily. , Disp: , Rfl:  .  potassium chloride (K-DUR,KLOR-CON) 10 MEQ tablet, Take 10 mEq by mouth 2 (two) times daily., Disp: , Rfl:  .  rOPINIRole (REQUIP) 4 MG tablet, Take 4 mg by mouth 2 (two) times daily. 7/8 Pt take 1 in the morning and 1 1/2 mg at bedtime, Disp: , Rfl:  .  rosuvastatin (CRESTOR) 10 MG tablet, Take 10 mg by mouth once a week., Disp: , Rfl:  .  UNABLE TO FIND, CPAP at bedtime, Disp: , Rfl:   Past Medical History: Past Medical History:  Diagnosis Date  . Anemia   . Arthritis   . Asthma   . CHF (congestive heart failure) ([place]) 05/2014  . Chronic diastolic heart failure ([place])   . CKD (chronic kidney disease)   .  Depression   . Diabetes mellitus without complication ([place])   . Diverticulitis   . Dyspnea    with exertion  . GERD (gastroesophageal reflux disease)   . History of blood transfusion   . Hyperlipidemia    cannot tolerate statins  . Hypertension   . Peripheral neuropathy   . Pneumonia   . PONV (postoperative nausea and vomiting)   . Sleep apnea    uses CPAP  . Ulcerative colitis ([place])    dr Collene Mares    Tobacco Use: Social History   Tobacco Use  Smoking Status Never Smoker  Smokeless Tobacco Never Used    Labs: Recent Review Flowsheet Data    Labs for ITP Cardiac and Pulmonary Rehab Latest Ref Rng & Units 10/21/2015 10/22/2015 04/02/2016 04/03/2016 05/04/2017   Cholestrol 0 - 200 mg/dL - - - - -   LDLCALC 0 - 99 mg/dL - - - -  -   HDL >39 mg/dL - - - - -   Trlycerides <150 mg/dL - - - - -   Hemoglobin A1c 4.8 - 5.6 % 7.4(H) - 7.6(H) - 8.5(H)   PHART 7.350 - 7.450 - 7.438 - 7.456(H) -   PCO2ART 32.0 - 48.0 mmHg - 41.0 - 45.2 -   HCO3 20.0 - 28.0 mmol/L - 27.3(H) - 31.4(H) -   TCO2 0 - 100 mmol/L - 25.1 - - -   O2SAT % - 95.7 - 90.6 -      Capillary Blood Glucose: Lab Results  Component Value Date   GLUCAP 204 (H) 05/06/2017   GLUCAP 266 (H) 05/06/2017   GLUCAP 221 (H) 05/05/2017   GLUCAP 211 (H) 05/05/2017   GLUCAP 291 (H) 05/05/2017   POCT Glucose    Row Name 10/26/17 1613 11/09/17 1632 11/23/17 1657 12/07/17 1649 12/21/17 1618     POCT Blood Glucose   Pre-Exercise  158 mg/dL  124 mg/dL  135 mg/dL  127 mg/dL  231 mg/dL   Post-Exercise  141 mg/dL  123 mg/dL  103 mg/dL  97 mg/dL  188 mg/dL      Pulmonary Assessment Scores: Pulmonary Assessment Scores    Row Name 09/30/17 0734 12/22/17 0906       ADL UCSD   ADL Phase  Entry  Exit    SOB Score total  -  101      CAT Score   CAT Score  -  27      mMRC Score   mMRC Score  4  -       Pulmonary Function Assessment:   Exercise Target Goals: Exercise Program Goal: Individual exercise prescription set using results from initial 6 min walk test and THRR while considering  patient's activity barriers and safety.   Exercise Prescription Goal: Initial exercise prescription builds to 30-45 minutes a day of aerobic activity, 2-3 days per week.  Home exercise guidelines will be given to patient during program as part of exercise prescription that the participant will acknowledge.  Activity Barriers & Risk Stratification: Activity Barriers & Cardiac Risk Stratification - 09/13/17 1612      Activity Barriers & Cardiac Risk Stratification   Activity Barriers  Joint Problems;Back Problems;Shortness of Breath;History of Falls;Balance Concerns;Arthritis       6 Minute Walk: 6 Minute Walk    Row Name 09/30/17 0730         6 Minute Walk    Phase  Initial     Distance  631 feet     Walk Time  -  4 minutes 25 seconds     # of Rest Breaks  2 1 minute 35 seconds total     MPH  1.19     METS  1.92     RPE  15     Perceived Dyspnea   3     Symptoms  Yes (comment)     Comments  9/10 sciatica pain     Resting HR  90 bpm     Resting BP  118/60     Resting Oxygen Saturation   90 %     Exercise Oxygen Saturation  during 6 min walk  90 %     Max Ex. HR  107 bpm     Max Ex. BP  140/58       Interval HR   1 Minute HR  106     2 Minute HR  107     3 Minute HR  106     4 Minute HR  104     5 Minute HR  104     6 Minute HR  104     2 Minute Post HR  95     Interval Heart Rate?  Yes       Interval Oxygen   Interval Oxygen?  Yes     Baseline Oxygen Saturation %  90 %     1 Minute Oxygen Saturation %  91 %     1 Minute Liters of Oxygen  0 L     2 Minute Oxygen Saturation %  92 %     2 Minute Liters of Oxygen  0 L     3 Minute Oxygen Saturation %  93 %     3 Minute Liters of Oxygen  0 L     4 Minute Oxygen Saturation %  95 %     4 Minute Liters of Oxygen  0 L     5 Minute Oxygen Saturation %  95 %     5 Minute Liters of Oxygen  0 L     6 Minute Oxygen Saturation %  95 %     6 Minute Liters of Oxygen  0 L     2 Minute Post Oxygen Saturation %  95 %     2 Minute Post Liters of Oxygen  0 L        Oxygen Initial Assessment: Oxygen Initial Assessment - 09/30/17 0730      Initial 6 min Walk   Oxygen Used  None      Program Oxygen Prescription   Program Oxygen Prescription  None       Oxygen Re-Evaluation: Oxygen Re-Evaluation    Row Name 10/26/17 3235 11/23/17 0947 12/23/17 0737         Program Oxygen Prescription   Program Oxygen Prescription  None  None  None       Home Oxygen   Home Oxygen Device  None  None  None     Sleep Oxygen Prescription  CPAP  CPAP  CPAP     Home Exercise Oxygen Prescription  None  None  None     Home at Rest Exercise Oxygen Prescription  None  None  None     Compliance with Home  Oxygen Use  Yes  Yes  Yes       Goals/Expected Outcomes   Short Term Goals  -  To learn and understand importance of monitoring SPO2 with pulse oximeter and demonstrate accurate use  of the pulse oximeter.;To learn and understand importance of maintaining oxygen saturations>88%;To learn and demonstrate proper pursed lip breathing techniques or other breathing techniques.;To learn and demonstrate proper use of respiratory medications  To learn and understand importance of monitoring SPO2 with pulse oximeter and demonstrate accurate use of the pulse oximeter.;To learn and understand importance of maintaining oxygen saturations>88%;To learn and demonstrate proper pursed lip breathing techniques or other breathing techniques.;To learn and demonstrate proper use of respiratory medications     Long  Term Goals  -  Verbalizes importance of monitoring SPO2 with pulse oximeter and return demonstration;Maintenance of O2 saturations>88%;Exhibits proper breathing techniques, such as pursed lip breathing or other method taught during program session;Compliance with respiratory medication;Demonstrates proper use of MDI's  Verbalizes importance of monitoring SPO2 with pulse oximeter and return demonstration;Maintenance of O2 saturations>88%;Exhibits proper breathing techniques, such as pursed lip breathing or other method taught during program session;Compliance with respiratory medication;Demonstrates proper use of MDI's     Goals/Expected Outcomes  maintain compliance with CPAP  maintain compliance with CPAP  maintain compliance with CPAP        Oxygen Discharge (Final Oxygen Re-Evaluation): Oxygen Re-Evaluation - 12/23/17 0737      Program Oxygen Prescription   Program Oxygen Prescription  None      Home Oxygen   Home Oxygen Device  None    Sleep Oxygen Prescription  CPAP    Home Exercise Oxygen Prescription  None    Home at Rest Exercise Oxygen Prescription  None    Compliance with Home Oxygen Use  Yes       Goals/Expected Outcomes   Short Term Goals  To learn and understand importance of monitoring SPO2 with pulse oximeter and demonstrate accurate use of the pulse oximeter.;To learn and understand importance of maintaining oxygen saturations>88%;To learn and demonstrate proper pursed lip breathing techniques or other breathing techniques.;To learn and demonstrate proper use of respiratory medications    Long  Term Goals  Verbalizes importance of monitoring SPO2 with pulse oximeter and return demonstration;Maintenance of O2 saturations>88%;Exhibits proper breathing techniques, such as pursed lip breathing or other method taught during program session;Compliance with respiratory medication;Demonstrates proper use of MDI's    Goals/Expected Outcomes  maintain compliance with CPAP       Initial Exercise Prescription: Initial Exercise Prescription - 09/30/17 0700      Date of Initial Exercise RX and Referring Provider   Date  09/28/17    Referring Provider  Dr. Halford Chessman      NuStep   Level  3    SPM  80    Minutes  17    METs  1.5      Arm Ergometer   Level  1    Watts  10    Minutes  17      Track   Laps  4    Minutes  17      Prescription Details   Frequency (times per week)  2    Duration  Progress to 45 minutes of aerobic exercise without signs/symptoms of physical distress      Intensity   THRR 40-80% of Max Heartrate  63-126    Ratings of Perceived Exertion  11-13    Perceived Dyspnea  0-4      Progression   Progression  Continue progressive overload as per policy without signs/symptoms or physical distress.      Resistance Training   Training Prescription  Yes    Weight  blue bands  Reps  10-15       Perform Capillary Blood Glucose checks as needed.  Exercise Prescription Changes: Exercise Prescription Changes    Row Name 10/26/17 1600 11/09/17 1600 11/23/17 1600 11/25/17 1003 12/07/17 1600     Response to Exercise   Blood Pressure (Admit)  106/68  130/50   120/60  -  122/80   Blood Pressure (Exercise)  116/60  124/62  128/80  -  172/56   Blood Pressure (Exit)  108/60  118/60  112/78  -  142/78   Heart Rate (Admit)  80 bpm  81 bpm  81 bpm  -  79 bpm   Heart Rate (Exercise)  103 bpm  109 bpm  118 bpm  -  106 bpm   Heart Rate (Exit)  87 bpm  91 bpm  94 bpm  -  99 bpm   Oxygen Saturation (Admit)  90 %  93 %  91 %  -  94 %   Oxygen Saturation (Exercise)  91 %  92 %  93 %  -  92 %   Oxygen Saturation (Exit)  92 %  92 %  95 %  -  99 %   Rating of Perceived Exertion (Exercise)  _0 -  15   Perceived Dyspnea (Exercise)  _1 -  2   Duration  Progress to 45 minutes of aerobic exercise without signs/symptoms of physical distress  Progress to 45 minutes of aerobic exercise without signs/symptoms of physical distress  Progress to 45 minutes of aerobic exercise without signs/symptoms of physical distress  -  Continue with 45 min of aerobic exercise without signs/symptoms of physical distress.   Intensity  Other (comment) 40-80% of HRR  THRR unchanged  THRR unchanged  -  THRR unchanged     Progression   Progression  Continue to progress workloads to maintain intensity without signs/symptoms of physical distress.  Continue to progress workloads to maintain intensity without signs/symptoms of physical distress.  Continue to progress workloads to maintain intensity without signs/symptoms of physical distress.  -  Camera operator Prescription  Yes  Yes  Yes  -  Yes   Weight  blue bands  blue bands  blue bands  -  blue bands   Reps  10-15  10-15  10-15  -  10-15   Time  10 Minutes  10 Minutes  10 Minutes  -  -     Interval Training   Interval Training  No  No  No  -  -     NuStep   Level  _2 -  4   SPM  80  80  80  -  80   Minutes  45  34  34  -  17   METs  1.7  2.1  2.1  -  2.1     Track   Laps  -  8  9  -  8   Minutes  -  17  17  -  17     Home Exercise Plan   Plans to continue exercise at  -  -  -  Home  (comment)  -   Frequency  -  -  -  Add 2 additional days to program exercise sessions.  -   Row Name 12/21/17 1600  Response to Exercise   Blood Pressure (Admit)  124/70       Blood Pressure (Exercise)  124/64       Blood Pressure (Exit)  130/60       Heart Rate (Admit)  83 bpm       Heart Rate (Exercise)  99 bpm       Heart Rate (Exit)  89 bpm       Oxygen Saturation (Admit)  93 %       Oxygen Saturation (Exercise)  93 %       Oxygen Saturation (Exit)  95 %       Rating of Perceived Exertion (Exercise)  12       Perceived Dyspnea (Exercise)  2       Duration  Continue with 45 min of aerobic exercise without signs/symptoms of physical distress.       Intensity  THRR unchanged         Progression   Progression  Continue to progress workloads to maintain intensity without signs/symptoms of physical distress.         Resistance Training   Training Prescription  Yes       Weight  blue bands       Reps  10-15       Time  10 Minutes         NuStep   Level  6       SPM  80       Minutes  51       METs  2          Exercise Comments: Exercise Comments    Row Name 11/26/17 1004           Exercise Comments  Home exercise completed          Exercise Goals and Review: Exercise Goals    [place] Name 09/13/17 1436             Exercise Goals   Increase Physical Activity  Yes       Intervention  Provide advice, education, support and counseling about physical activity/exercise needs.;Develop an individualized exercise prescription for aerobic and resistive training based on initial evaluation findings, risk stratification, comorbidities and participant's personal goals.       Expected Outcomes  Short Term: Attend rehab on a regular basis to increase amount of physical activity.;Long Term: Add in home exercise to make exercise part of routine and to increase amount of physical activity.;Long Term: Exercising regularly at least 3-5 days a week.       Increase  Strength and Stamina  Yes       Intervention  Provide advice, education, support and counseling about physical activity/exercise needs.;Develop an individualized exercise prescription for aerobic and resistive training based on initial evaluation findings, risk stratification, comorbidities and participant's personal goals.       Expected Outcomes  Short Term: Increase workloads from initial exercise prescription for resistance, speed, and METs.;Short Term: Perform resistance training exercises routinely during rehab and add in resistance training at home;Long Term: Improve cardiorespiratory fitness, muscular endurance and strength as measured by increased METs and functional capacity (6MWT)       Able to understand and use rate of perceived exertion (RPE) scale  Yes       Intervention  Provide education and explanation on how to use RPE scale       Expected Outcomes  Short Term: Able to use RPE daily in rehab to express  subjective intensity level;Long Term:  Able to use RPE to guide intensity level when exercising independently       Able to understand and use Dyspnea scale  Yes       Intervention  Provide education and explanation on how to use Dyspnea scale       Expected Outcomes  Short Term: Able to use Dyspnea scale daily in rehab to express subjective sense of shortness of breath during exertion;Long Term: Able to use Dyspnea scale to guide intensity level when exercising independently       Knowledge and understanding of Target Heart Rate Range (THRR)  Yes       Intervention  Provide education and explanation of THRR including how the numbers were predicted and where they are located for reference       Expected Outcomes  Short Term: Able to state/look up THRR;Short Term: Able to use daily as guideline for intensity in rehab;Long Term: Able to use THRR to govern intensity when exercising independently       Intervention  Provide education, explanation, and written materials on patient's  individual exercise prescription       Expected Outcomes  Short Term: Able to explain program exercise prescription;Long Term: Able to explain home exercise prescription to exercise independently          Exercise Goals Re-Evaluation : Exercise Goals Re-Evaluation    Row Name 10/26/17 [phone] 11/23/17 0947 12/23/17 0737         Exercise Goal Re-Evaluation   Exercise Goals Review  Increase Strength and Stamina;Able to understand and use Dyspnea scale;Able to understand and use rate of perceived exertion (RPE) scale;Increase Physical Activity;Knowledge and understanding of Target Heart Rate Range (THRR);Understanding of Exercise Prescription  Increase Strength and Stamina;Able to understand and use Dyspnea scale;Able to understand and use rate of perceived exertion (RPE) scale;Increase Physical Activity;Knowledge and understanding of Target Heart Rate Range (THRR);Understanding of Exercise Prescription  Increase Strength and Stamina;Able to understand and use Dyspnea scale;Able to understand and use rate of perceived exertion (RPE) scale;Increase Physical Activity;Knowledge and understanding of Target Heart Rate Range (THRR);Understanding of Exercise Prescription     Comments  Patient has had a slow start to rehab. Very tearful. Somedays patient can only do non-weight bearing exercise due to pain. Her MET level places her in a low level. Will cont. to monitor and progress as able.   Patient is progressing slowly. Major barriers are chronic pain. She walks the track even though it is very difficult for her-this shows me she is trying to progress. Met level places her in a low level--I will discuss with patient how to increase levels. Will discuss home exercise and its importance. Cont. to monitor and motivate.  Patient is progressing slowly. Major barriers are chronic pain. She walks the track even though it is very difficult for her-this shows me she is trying to progress. Met level places her in a low  level--I will discuss with patient how to increase levels. Will discuss home exercise and its importance. Patient will be graduating 12/28/17. Cont. to monitor and motivate.     Expected Outcomes  Through exercise at rehab and at home, patient will increase strength and stamina making ADL's easier to perform. Patient will also have a better understanding of safe exercise and what they are capable to do outside of clinical supervision.  Through exercise at rehab and at home, patient will increase strength and stamina making ADL's easier to perform. Patient will also have a  better understanding of safe exercise and what they are capable to do outside of clinical supervision.  Through exercise at rehab and at home, patient will increase strength and stamina making ADL's easier to perform. Patient will also have a better understanding of safe exercise and what they are capable to do outside of clinical supervision.        Discharge Exercise Prescription (Final Exercise Prescription Changes): Exercise Prescription Changes - 12/21/17 1600      Response to Exercise   Blood Pressure (Admit)  124/70    Blood Pressure (Exercise)  124/64    Blood Pressure (Exit)  130/60    Heart Rate (Admit)  83 bpm    Heart Rate (Exercise)  99 bpm    Heart Rate (Exit)  89 bpm    Oxygen Saturation (Admit)  93 %    Oxygen Saturation (Exercise)  93 %    Oxygen Saturation (Exit)  95 %    Rating of Perceived Exertion (Exercise)  12    Perceived Dyspnea (Exercise)  2    Duration  Continue with 45 min of aerobic exercise without signs/symptoms of physical distress.    Intensity  THRR unchanged      Progression   Progression  Continue to progress workloads to maintain intensity without signs/symptoms of physical distress.      Resistance Training   Training Prescription  Yes    Weight  blue bands    Reps  10-15    Time  10 Minutes      NuStep   Level  6    SPM  80    Minutes  51    METs  2       Nutrition:   Target Goals: Understanding of nutrition guidelines, daily intake of sodium <1535m, cholesterol <2083m calories 30% from fat and 7% or less from saturated fats, daily to have 5 or more servings of fruits and vegetables.  Biometrics:    Nutrition Therapy Plan and Nutrition Goals: Nutrition Therapy & Goals - 10/05/17 1551      Nutrition Therapy   Diet  Carb Modified, Heart Healthy      Personal Nutrition Goals   Nutrition Goal  Identify food quantities necessary to achieve wt loss of  -2# per week to a goal wt loss of 6-24 lb at graduation from pulmonary rehab.    Personal Goal #2  personal goal for CBG's in the normal range not met, as identified by CBG's pre and post exercise      Intervention Plan   Intervention  Prescribe, educate and counsel regarding individualized specific dietary modifications aiming towards targeted core components such as weight, hypertension, lipid management, diabetes, heart failure and other comorbidities.    Expected Outcomes  Short Term Goal: Understand basic principles of dietary content, such as calories, fat, sodium, cholesterol and nutrients.;Long Term Goal: Adherence to prescribed nutrition plan.       Nutrition Assessments: Nutrition Assessments - 10/05/17 1556      Rate Your Plate Scores   Pre Score  43       Nutrition Goals Re-Evaluation: Nutrition Goals Re-Evaluation    Row Name 08/20/17 1016 08/20/17 1019 10/05/17 1551         Goals   Current Weight  273 lb 13 oz (124.2 kg)  273 lb 13 oz (124.2 kg)  270 lb (122.5 kg)     Nutrition Goal  Identify food quantities necessary to achieve wt loss of  -2# per week to a  goal wt loss of 6-24 lb at graduation from pulmonary rehab.  Identify food quantities necessary to achieve wt loss of  -2# per week to a goal wt loss of 6-24 lb at graduation from pulmonary rehab.  -     Comment  pt maintained weight, weight loss goal not met  pt maintained weight, weight loss goal not met  -        Personal Goal #2 Re-Evaluation   Personal Goal #2  CBG's in the normal range or as close to normal as is safely possible.  personal goal for CBG's in the normal range not met, as identified by CBG's pre and post exercise  -        Nutrition Goals Discharge (Final Nutrition Goals Re-Evaluation): Nutrition Goals Re-Evaluation - 10/05/17 1551      Goals   Current Weight  270 lb (122.5 kg)       Psychosocial: Target Goals: Acknowledge presence or absence of significant depression and/or stress, maximize coping skills, provide positive support system. Participant is able to verbalize types and ability to use techniques and skills needed for reducing stress and depression.  Initial Review & Psychosocial Screening: Initial Psych Review & Screening - 09/13/17 1444      Initial Review   Current issues with  Current Depression;Current Stress Concerns    Source of Stress Concerns  Unable to participate in former interests or hobbies;Chronic Illness;Unable to perform yard/household activities      [place]?  Yes      Barriers   Psychosocial barriers to participate in program  The patient should benefit from training in stress management and relaxation.      Screening Interventions   Interventions  Encouraged to exercise    Expected Outcomes  Long Term Goal: Stressors or current issues are controlled or eliminated.;Short Term goal: Identification and review with participant of any Quality of Life or Depression concerns found by scoring the questionnaire.;Long Term goal: The participant improves quality of Life and PHQ9 Scores as seen by post scores and/or verbalization of changes       Quality of Life Scores:  Scores of 19 and below usually indicate a poorer quality of life in these areas.  A difference of  2-3 points is a clinically meaningful difference.  A difference of 2-3 points in the total score of the Quality of Life Index has been associated with  significant improvement in overall quality of life, self-image, physical symptoms, and general health in studies assessing change in quality of life.  PHQ-9: Recent Review Flowsheet Data    Depression screen [facility] 2/9 09/13/2017 03/29/2017   Decreased Interest 3 3   Down, Depressed, Hopeless 3 2   PHQ - 2 Score 6 5   Altered sleeping 3 3   Tired, decreased energy 3 3   Change in appetite 1 1   Feeling bad or failure about yourself  3 3   Trouble concentrating 3 3   Moving slowly or fidgety/restless 3 2   Suicidal thoughts 0 0   PHQ-9 Score 22 20   Difficult doing work/chores Very difficult Extremely dIfficult     Interpretation of Total Score  Total Score Depression Severity:  1-4 = Minimal depression, 5-9 = Mild depression, 10-14 = Moderate depression, 15-19 = Moderately severe depression, 20-27 = Severe depression   Psychosocial Evaluation and Intervention: Psychosocial Evaluation - 12/23/17 1043      Psychosocial Evaluation & Interventions   Interventions  Encouraged to exercise with the program and follow exercise prescription    Comments  Pt with improved disposition.  Pt seems to be coping better with her personal situation.  Pt outlook remains somewhat negative at times mainly due to her furstration with medical care however this is also improving.    Expected Outcomes  Pt will report less to none tearful episodes.  Less dependency on others to help with ADL' such as cooking, house cleaning and laundry. Pt will have have positive outlook and healthy coping skills.        Psychosocial Re-Evaluation: Psychosocial Re-Evaluation    [place] Name 10/26/17 0848 11/24/17 1709           Psychosocial Re-Evaluation   Current issues with  Current Depression;History of Depression  Current Depression;History of Depression      Comments  No change re: depression since orientation.  Pt PCP continues to tweak pt medications.  This has been difficult for pt.  Pt reports mood swings and tearful  episodes over very little issues.  Pt attended Meditation and mindfulness education class.  Pt with less mood swings and tearful episodes.      Expected Outcomes  patient will remain free from psychosocial barriers to participation in pulmonary rehab program  patient will remain free from psychosocial barriers to participation in pulmonary rehab program      Interventions  Encouraged to attend Pulmonary Rehabilitation for the exercise  Encouraged to attend Pulmonary Rehabilitation for the exercise;Stress management education;Relaxation education      Continue Psychosocial Services   Follow up required by staff  Follow up required by staff         Psychosocial Discharge (Final Psychosocial Re-Evaluation): Psychosocial Re-Evaluation - 11/24/17 1709      Psychosocial Re-Evaluation   Current issues with  Current Depression;History of Depression    Comments  Pt with less mood swings and tearful episodes.    Expected Outcomes  patient will remain free from psychosocial barriers to participation in pulmonary rehab program    Interventions  Encouraged to attend Pulmonary Rehabilitation for the exercise;Stress management education;Relaxation education    Continue Psychosocial Services   Follow up required by staff       Education: Education Goals: Education classes will be provided on a weekly basis, covering required topics. Participant will state understanding/return demonstration of topics presented.  Learning Barriers/Preferences: Learning Barriers/Preferences - 09/13/17 1612      Learning Barriers/Preferences   Learning Barriers  Sight    Learning Preferences  Computer/Internet;Written Material;Video;Verbal Instruction;Skilled Demonstration;Pictoral;Individual Instruction       Education Topics: Risk Factor Reduction:  -Group instruction that is supported by a PowerPoint presentation. Instructor discusses the definition of a risk factor, different risk factors for pulmonary disease, and  how the heart and lungs work together.     Nutrition for Pulmonary Patient:  -Group instruction provided by PowerPoint slides, verbal discussion, and written materials to support subject matter. The instructor gives an explanation and review of healthy diet recommendations, which includes a discussion on weight management, recommendations for fruit and vegetable consumption, as well as protein, fluid, caffeine, fiber, sodium, sugar, and alcohol. Tips for eating when patients are short of breath are discussed.   PULMONARY REHAB OTHER RESPIRATORY from 12/16/2017 in [place]  Date  11/04/17  Educator  Rodman Pickle  Instruction Review Code  2- Demonstrated Understanding      Pursed Lip Breathing:  -Group instruction that is supported by demonstration and informational handouts.  Instructor discusses the benefits of pursed lip and diaphragmatic breathing and detailed demonstration on how to preform both.     Oxygen Safety:  -Group instruction provided by PowerPoint, verbal discussion, and written material to support subject matter. There is an overview of "What is Oxygen" and "Why do we need it".  Instructor also reviews how to create a safe environment for oxygen use, the importance of using oxygen as prescribed, and the risks of noncompliance. There is a brief discussion on traveling with oxygen and resources the patient may utilize.   PULMONARY REHAB OTHER RESPIRATORY from 12/16/2017 in [place]  Date  12/09/17  Educator  EP  Instruction Review Code  2- Demonstrated Understanding      Oxygen Equipment:  -Group instruction provided by [facility] Staff utilizing handouts, written materials, and equipment demonstrations.   PULMONARY REHAB OTHER RESPIRATORY from 12/16/2017 in [place]  Date  12/16/17  Educator  Ace Gins  Instruction Review Code  1- Verbalizes Understanding      Signs and  Symptoms:  -Group instruction provided by written material and verbal discussion to support subject matter. Warning signs and symptoms of infection, stroke, and heart attack are reviewed and when to call the physician/911 reinforced. Tips for preventing the spread of infection discussed.   PULMONARY REHAB OTHER RESPIRATORY from 12/16/2017 in [place]  Date  12/02/17  Educator  rn  Instruction Review Code  1- Verbalizes Understanding      Advanced Directives:  -Group instruction provided by verbal instruction and written material to support subject matter. Instructor reviews Advanced Directive laws and proper instruction for filling out document.   Pulmonary Video:  -Group video education that reviews the importance of medication and oxygen compliance, exercise, good nutrition, pulmonary hygiene, and pursed lip and diaphragmatic breathing for the pulmonary patient.   Exercise for the Pulmonary Patient:  -Group instruction that is supported by a PowerPoint presentation. Instructor discusses benefits of exercise, core components of exercise, frequency, duration, and intensity of an exercise routine, importance of utilizing pulse oximetry during exercise, safety while exercising, and options of places to exercise outside of rehab.     PULMONARY REHAB OTHER RESPIRATORY from 12/16/2017 in [place]  Date  10/07/17  Instruction Review Code  1- Verbalizes Understanding      Pulmonary Medications:  -Verbally interactive group education provided by instructor with focus on inhaled medications and proper administration.   PULMONARY REHAB OTHER RESPIRATORY from 12/16/2017 in [place]  Date  06/03/17  Educator  Pharmacist  Instruction Review Code  1- Verbalizes Understanding      Anatomy and Physiology of the Respiratory System and Intimacy:  -Group instruction provided by PowerPoint, verbal  discussion, and written material to support subject matter. Instructor reviews respiratory cycle and anatomical components of the respiratory system and their functions. Instructor also reviews differences in obstructive and restrictive respiratory diseases with examples of each. Intimacy, Sex, and Sexuality differences are reviewed with a discussion on how relationships can change when diagnosed with pulmonary disease. Common sexual concerns are reviewed.   PULMONARY REHAB OTHER RESPIRATORY from 12/16/2017 in [place]  Date  10/28/17  Educator  RN  Instruction Review Code  2- Demonstrated Understanding      MD DAY -A group question and answer session with a medical doctor that allows participants to ask questions that relate to their  pulmonary disease state.   OTHER EDUCATION -Group or individual verbal, written, or video instructions that support the educational goals of the pulmonary rehab program.   PULMONARY REHAB OTHER RESPIRATORY from 12/16/2017 in [place]  Date  11/25/17  Educator  Cloyde Reams  Instruction Review Code  1- Verbalizes Understanding [Sedentary Lifestyle]      Holiday Eating Survival Tips:  -Group instruction provided by [employer], verbal discussion, and written materials to support subject matter. The instructor gives patients tips, tricks, and techniques to help them not only survive but enjoy the holidays despite the onslaught of food that accompanies the holidays.   Knowledge Questionnaire Score: Knowledge Questionnaire Score - 12/22/17 0906      Knowledge Questionnaire Score   Post Score  18/18       Core Components/Risk Factors/Patient Goals at Admission: Personal Goals and Risk Factors at Admission - 09/13/17 1420      Core Components/Risk Factors/Patient Goals on Admission    Weight Management  Yes;Weight Loss    Intervention  Weight Management/Obesity: Establish reasonable short  term and long term weight goals.;Obesity: Provide education and appropriate resources to help participant work on and attain dietary goals.;Weight Management: Provide education and appropriate resources to help participant work on and attain dietary goals.;Weight Management: Develop a combined nutrition and exercise program designed to reach desired caloric intake, while maintaining appropriate intake of nutrient and fiber, sodium and fats, and appropriate energy expenditure required for the weight goal.    Admit Weight  270 lb 15.1 oz (122.9 kg)    Goal Weight: Short Term  260 lb (117.9 kg)    Goal Weight: Long Term  250 lb (113.4 kg)    Expected Outcomes  Long Term: Adherence to nutrition and physical activity/exercise program aimed toward attainment of established weight goal;Weight Loss: Understanding of general recommendations for a balanced deficit meal plan, which promotes 1-2 lb weight loss per week and includes a negative energy balance of [phone] kcal/d;Understanding recommendations for meals to include 15-35% energy as protein, 25-35% energy from fat, 35-60% energy from carbohydrates, less than 256m of dietary cholesterol, 20-35 gm of total fiber daily;Understanding of distribution of calorie intake throughout the day with the consumption of 4-5 meals/snacks    Improve shortness of breath with ADL's  Yes    Intervention  Provide education, individualized exercise plan and daily activity instruction to help decrease symptoms of SOB with activities of daily living.    Expected Outcomes  Short Term: Improve cardiorespiratory fitness to achieve a reduction of symptoms when performing ADLs;Long Term: Be able to perform more ADLs without symptoms or delay the onset of symptoms    Heart Failure  Yes    Intervention  Provide a combined exercise and nutrition program that is supplemented with education, support and counseling about heart failure. Directed toward relieving symptoms such as shortness of  breath, decreased exercise tolerance, and extremity edema.    Expected Outcomes  Improve functional capacity of life;Short term: Attendance in program 2-3 days a week with increased exercise capacity. Reported lower sodium intake. Reported increased fruit and vegetable intake. Reports medication compliance.;Short term: Daily weights obtained and reported for increase. Utilizing diuretic protocols set by physician.;Long term: Adoption of self-care skills and reduction of barriers for early signs and symptoms recognition and intervention leading to self-care maintenance.    Stress  Yes    Intervention  Offer individual and/or small group education and counseling on adjustment to heart disease, stress management and  health-related lifestyle change. Teach and support self-help strategies.;Refer participants experiencing significant psychosocial distress to appropriate mental health specialists for further evaluation and treatment. When possible, include family members and significant others in education/counseling sessions.    Expected Outcomes  Short Term: Participant demonstrates changes in health-related behavior, relaxation and other stress management skills, ability to obtain effective social support, and compliance with psychotropic medications if prescribed.;Long Term: Emotional wellbeing is indicated by absence of clinically significant psychosocial distress or social isolation.       Core Components/Risk Factors/Patient Goals Review:  Goals and Risk Factor Review    Row Name 10/26/17 0851 10/26/17 0852 10/27/17 0955 11/24/17 1711 12/23/17 1043     Core Components/Risk Factors/Patient Goals Review   Personal Goals Review  Weight Management/Obesity;Improve shortness of breath with ADL's;Develop more efficient breathing techniques such as purse lipped breathing and diaphragmatic breathing and practicing self-pacing with activity.;Stress  Weight Management/Obesity;Improve shortness of breath with  ADL's;Heart Failure;Stress;Diabetes  Weight Management/Obesity;Improve shortness of breath with ADL's;Heart Failure;Stress;Diabetes;Increase knowledge of respiratory medications and ability to use respiratory devices properly.;Develop more efficient breathing techniques such as purse lipped breathing and diaphragmatic breathing and practicing self-pacing with activity.  Weight Management/Obesity;Improve shortness of breath with ADL's;Heart Failure;Stress;Diabetes;Increase knowledge of respiratory medications and ability to use respiratory devices properly.;Develop more efficient breathing techniques such as purse lipped breathing and diaphragmatic breathing and practicing self-pacing with activity.  Weight Management/Obesity;Improve shortness of breath with ADL's;Heart Failure;Stress;Diabetes;Increase knowledge of respiratory medications and ability to use respiratory devices properly.;Develop more efficient breathing techniques such as purse lipped breathing and diaphragmatic breathing and practicing self-pacing with activity.   Review  -  Kathleen Hatfield has completed 5 exercise classess with one incomplete due to elevation in CBG over 300.  Weight:  pt has shown increase in her weight of 1 kg since starting on 7/30.  This is due to pt inablitiy to increase her physical actiivty outside of Pulmonary rehab..  This is do in part with pt significant history of depression. Pt met with dietician on 8/13. PCP is tweaking her medications and this has been a difficult transition for pt.  Continue to encourage and support pt. Pt with inrease in nustep level to 4, Track remains at 6 laps in a 15 minute station. Arm crank is level 1.  Will try to increase pt workloads and have pt engage in activities that will provide pt opportunities for movement at home.  Expect to see some progress twoard pulmonary rehab goals over the next 30 days.  Kathleen Hatfield has completed 14 exercise classess with one incomplete due to elevation in CBG over 300.   Weight:  pt has shown decreaseof 1.3 in her weight since starting on 7/30.  This is due to pt inablitiy to increase her physical actiivty outside of Pulmonary rehab..  This is do in part with pt significant history of depression. and deconditioning. Pt met with dietician on 8/13. Hopeful we will begin to see measureable progress toward her goal.  Continue to encourage and support pt. Pt with increase in nustep level to 5, for 30 minutesTrack increase to 9 laps in a 15 minute station.  Will try to increase pt workloads and have pt engage in activities that will provide pt opportunities for movement at home and increase social interaction.  Expect to see some progress twoard pulmonary rehab goals over the next 30 days.  Kathleen Hatfield has completed 22 exercise sessions and 9 education classes.   Weight:  pt has shown decreaseof 2.8kg  in  her weight since starting on 7/30.  This is due to pt inablitiy to increase her physical actiivty outside of Pulmonary rehab. Pt has met with the dietician several times for consult on strategies to lose weight.  Continue to encourage and support pt. Pt with increase in nustep level to 5, for 30 minutesTrack increase to 9 laps in a 15 minute station. Blood sugars remain within normal limits for exercise pre and post.  Pt adheres to no low salt diet and weighs herself daily.  Pt aware of when to contact the MD with fluid issues  Pt uses PLB on exertion.  Increased pt workload nustep level 6 for 45 minutes Will  have pt engage in any activities that will provide pt opportunities for movement at home and increase social interaction.  Anticiapte progress in pulmonary rehab goals over the next 30 days.   Expected Outcomes  see "admission" expected outcomes  see "admission" expected outcomes  See Admission Outcomes/Goals  See Admission Outcomes/Goals  See Admission Outcomes/Goals      Core Components/Risk Factors/Patient Goals at Discharge (Final Review):  Goals and Risk Factor Review -  12/23/17 1043      Core Components/Risk Factors/Patient Goals Review   Personal Goals Review  Weight Management/Obesity;Improve shortness of breath with ADL's;Heart Failure;Stress;Diabetes;Increase knowledge of respiratory medications and ability to use respiratory devices properly.;Develop more efficient breathing techniques such as purse lipped breathing and diaphragmatic breathing and practicing self-pacing with activity.    Review  Kathleen Hatfield has completed 22 exercise sessions and 9 education classes.   Weight:  pt has shown decreaseof 2.8kg  in her weight since starting on 7/30.  This is due to pt inablitiy to increase her physical actiivty outside of Pulmonary rehab. Pt has met with the dietician several times for consult on strategies to lose weight.  Continue to encourage and support pt. Pt with increase in nustep level to 5, for 30 minutesTrack increase to 9 laps in a 15 minute station. Blood sugars remain within normal limits for exercise pre and post.  Pt adheres to no low salt diet and weighs herself daily.  Pt aware of when to contact the MD with fluid issues  Pt uses PLB on exertion.  Increased pt workload nustep level 6 for 45 minutes Will  have pt engage in any activities that will provide pt opportunities for movement at home and increase social interaction.  Anticiapte progress in pulmonary rehab goals over the next 30 days.    Expected Outcomes  See Admission Outcomes/Goals       ITP Comments: ITP Comments    Row Name 09/13/17 1349 10/27/17 0955 11/24/17 1707 12/23/17 1042     ITP Comments  Dr. Jennet Maduro, Medical Director  Dr. Jennet Maduro, Medical Director  Dr. Jennet Maduro, Medical Director Pulmonary Rehab  Dr. Jennet Maduro, Medical Director Pulmonary Rehab       Comments: Pt completed 22 exercise sessions. Cherre Huger, BSN Cardiac and Training and development officer

## 2017-12-24 IMAGING — NM NM PULMONARY VENT & PERF
16 series · 16 of 16 positions shown · non-contrast
Comparison: None.

CLINICAL DATA: 60-year-old female with shortness of breath and
hypoxia.

EXAM:
NUCLEAR MEDICINE VENTILATION - PERFUSION LUNG SCAN
TECHNIQUE: Ventilation images were obtained in multiple projections using
inhaled aerosol Ic-DDm DTPA. Perfusion images were obtained in
multiple projections after intravenous injection of Ic-DDm MAA.
RADIOPHARMACEUTICALS:  32.8 mCi Yechnetium-AAm DTPA aerosol
inhalation and 4.38 mCi Yechnetium-AAm MAA IV

[Series 1: ant/post vent · 4.14mm/px · 1 of 1 slices shown (1 of 2)]
[im 1/1]
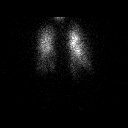

[Series 1: ant/post vent · 4.14mm/px · 1 of 1 slices shown (2 of 2)]
[im 1/1]
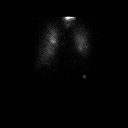

[Series 2: lao/rpo vent · 4.14mm/px · 1 of 1 slices shown (1 of 2)]
[im 1/1  full-range]
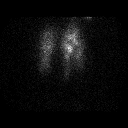

[Series 2: lao/rpo vent · 4.14mm/px · 1 of 1 slices shown (2 of 2)]
[im 1/1]
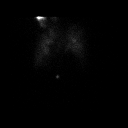

[Series 3: lpo/rao vent · 4.14mm/px · 1 of 1 slices shown (1 of 2)]
[im 1/1  full-range]
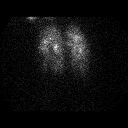

[Series 3: lpo/rao vent · 4.14mm/px · 1 of 1 slices shown (2 of 2)]
[im 1/1]
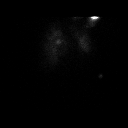

[Series 4: lt lat/rt lat vent · 4.14mm/px · 1 of 1 slices shown (1 of 2)]
[im 1/1]
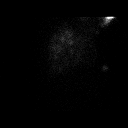

[Series 4: lt lat/rt lat vent · 4.14mm/px · 1 of 1 slices shown (2 of 2)]
[im 1/1]
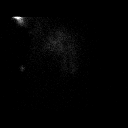

[Series 5: lt lat/rt lat perf · 4.14mm/px · 1 of 1 slices shown (1 of 2)]
[im 1/1]
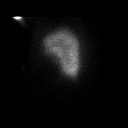

[Series 5: lt lat/rt lat perf · 4.14mm/px · 1 of 1 slices shown (2 of 2)]
[im 1/1]
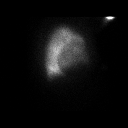

[Series 6: lpo/rao perf · 4.14mm/px · 1 of 1 slices shown (1 of 2)]
[im 1/1]
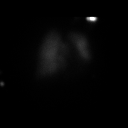

[Series 6: lpo/rao perf · 4.14mm/px · 1 of 1 slices shown (2 of 2)]
[im 1/1]
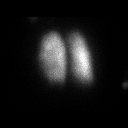

[Series 7: ant/post perf · 4.14mm/px · 1 of 1 slices shown (1 of 2)]
[im 1/1]
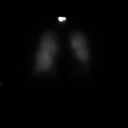

[Series 7: ant/post perf · 4.14mm/px · 1 of 1 slices shown (2 of 2)]
[im 1/1]
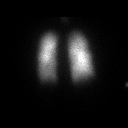

[Series 8: lao/rpo perf · 4.14mm/px · 1 of 1 slices shown (1 of 2)]
[im 1/1]
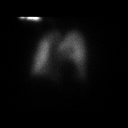

[Series 8: lao/rpo perf · 4.14mm/px · 1 of 1 slices shown (2 of 2)]
[im 1/1]
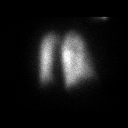

[16 of 16 positions shown; findings below may reference images not displayed]

FINDINGS: Ventilation: No focal ventilation defect.

Perfusion: No wedge shaped peripheral perfusion defects to suggest
acute pulmonary embolism.
IMPRESSION: Normal.  No evidence of pulmonary emboli.

## 2017-12-24 IMAGING — CR DG CHEST 2V
2 series · 2 of 2 positions shown · non-contrast
Comparison: 04/03/2016

CLINICAL DATA: Shortness of Breath

EXAM:
CHEST  2 VIEW

[chest lat]
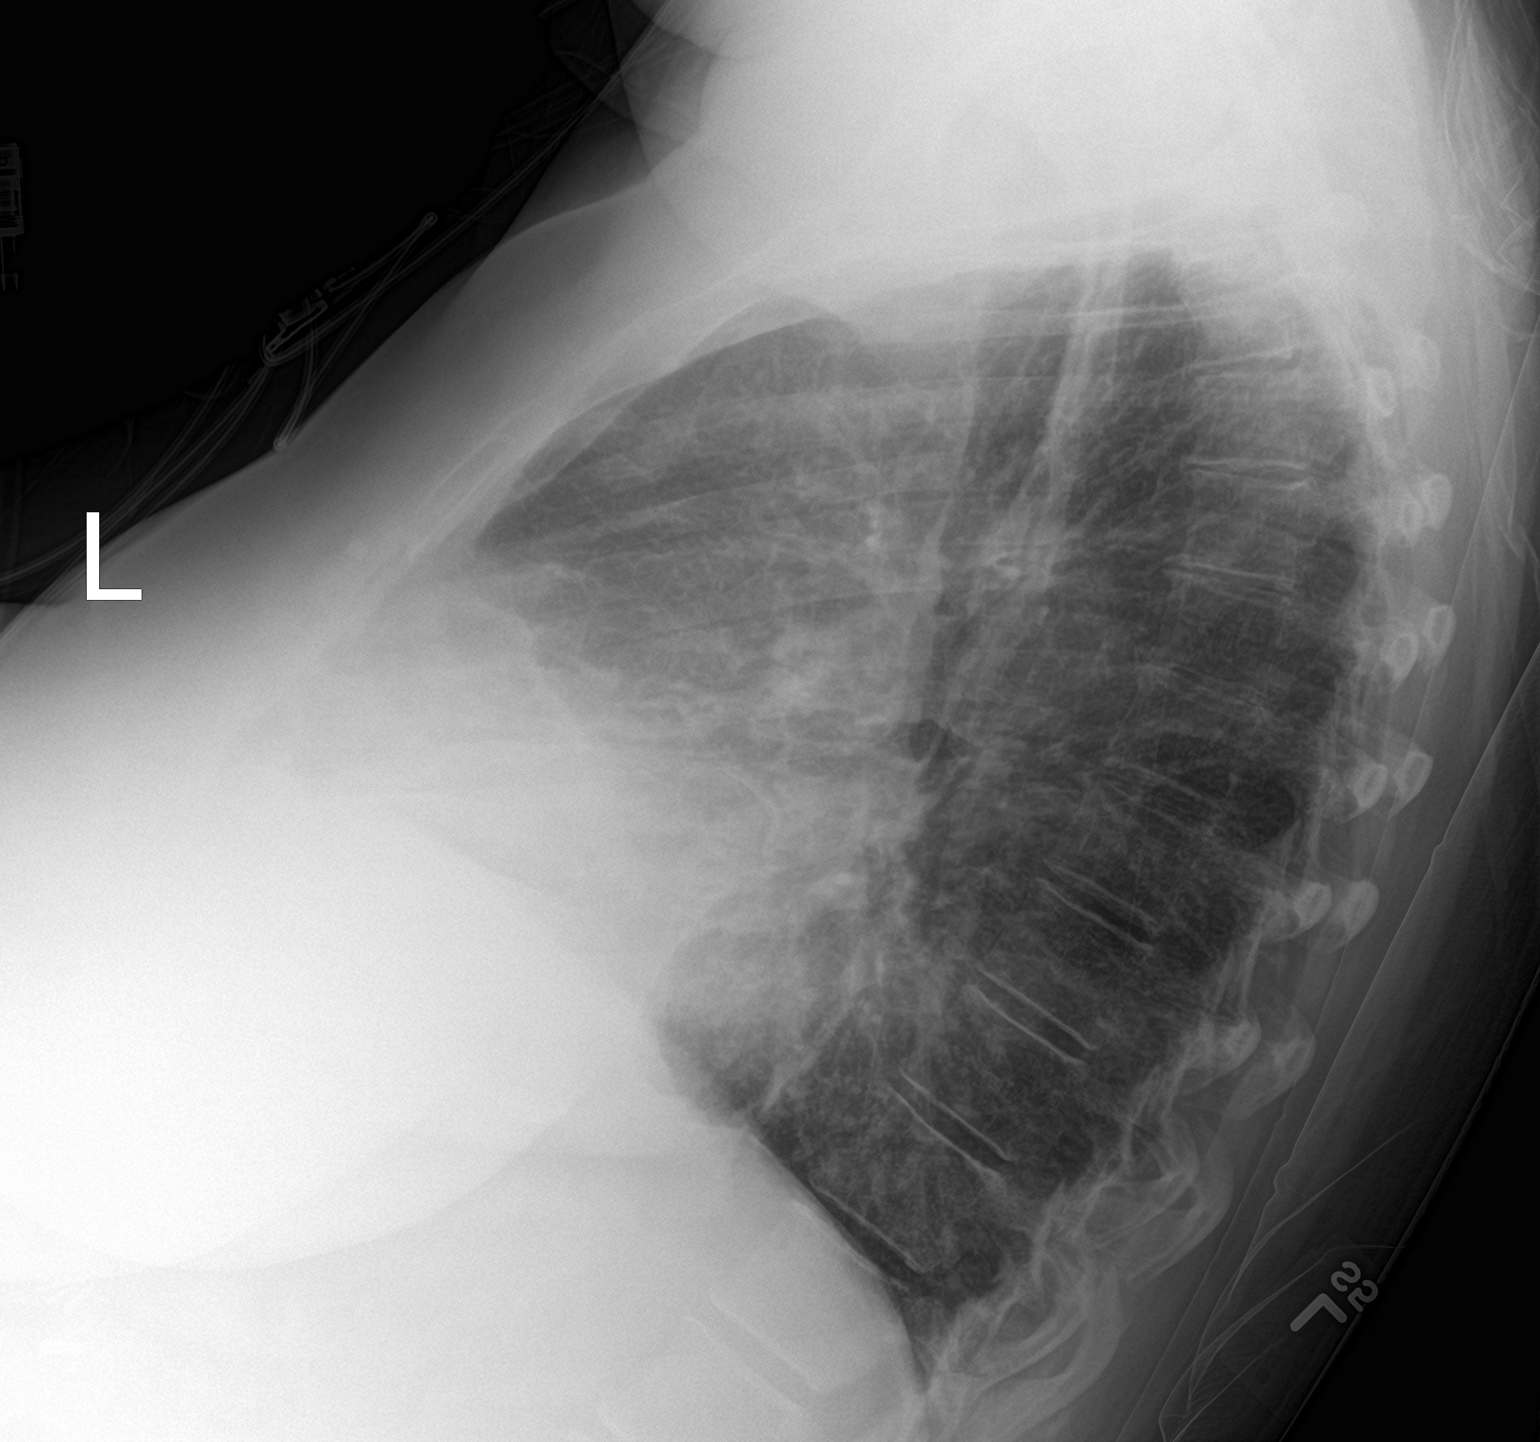

[chest ap]
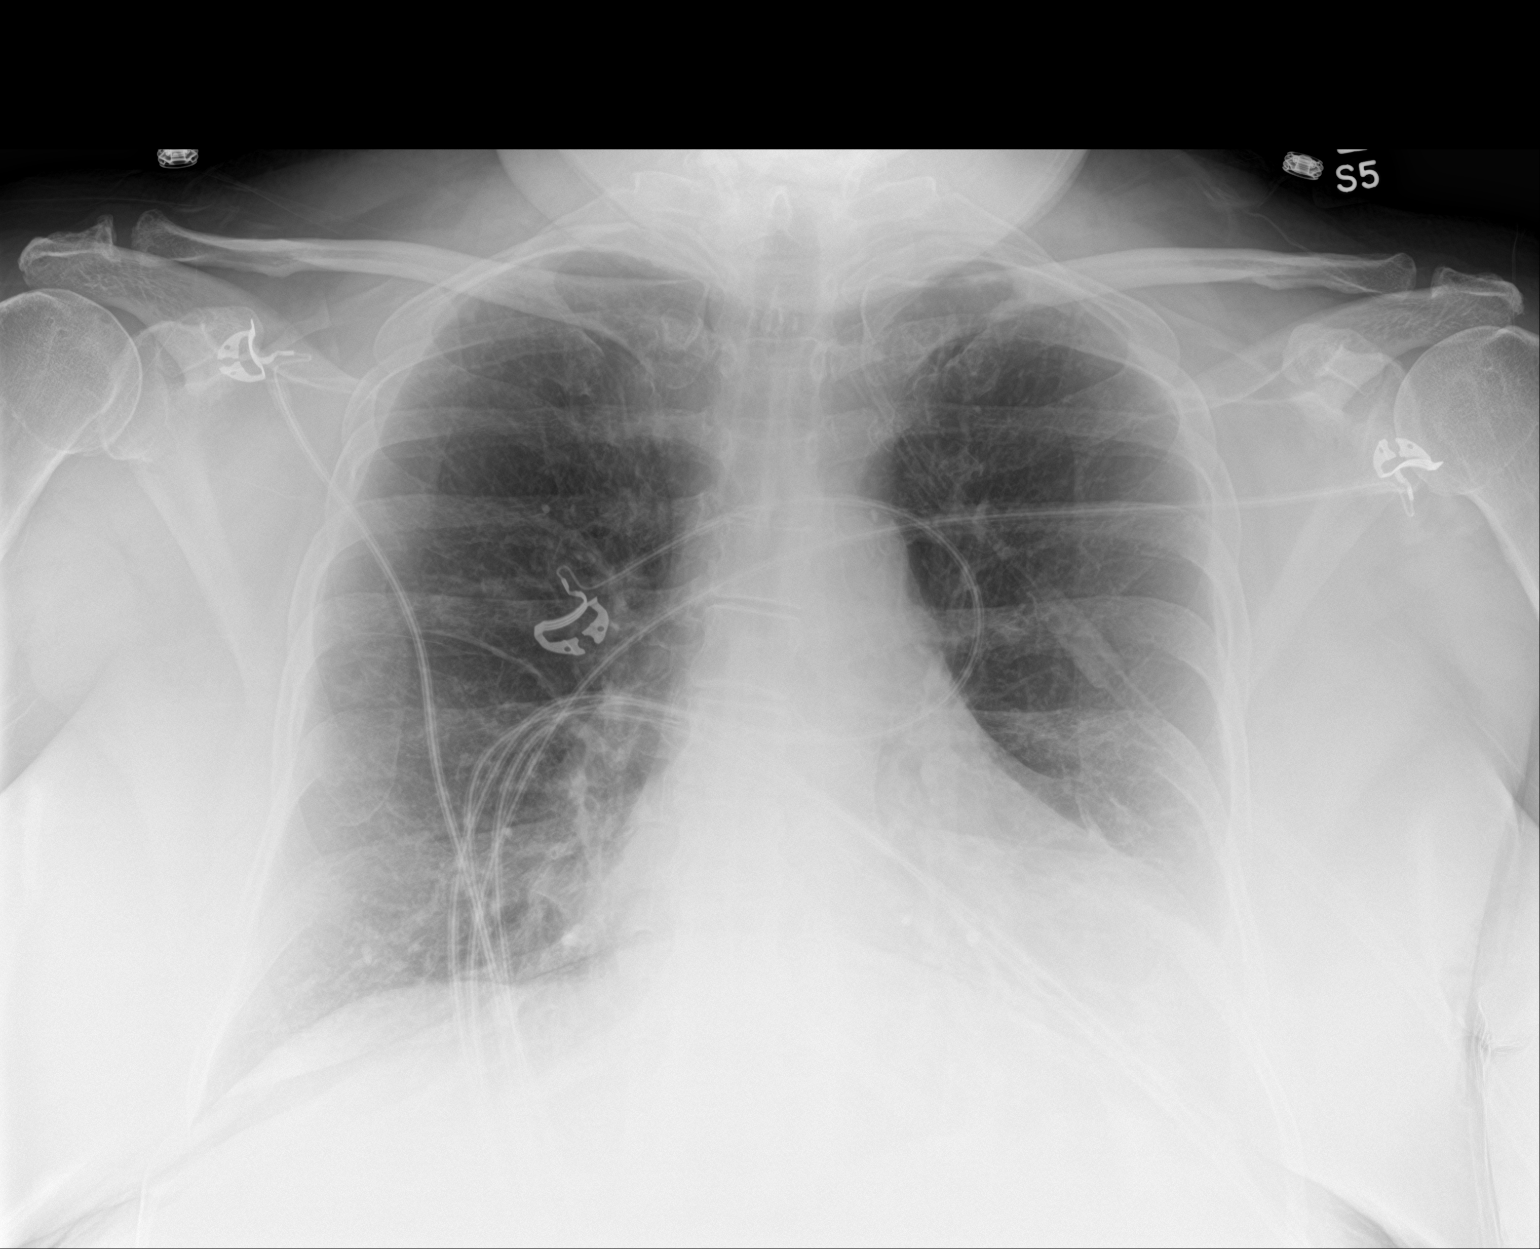

[2 of 2 positions shown; findings below may reference images not displayed]

FINDINGS: Mild cardiomegaly. No confluent airspace opacities or effusions. No
acute bony abnormality.
IMPRESSION: Mild cardiomegaly.  No active disease.

## 2017-12-28 ENCOUNTER — Encounter (HOSPITAL_COMMUNITY): Payer: Medicare HMO

## 2017-12-28 ENCOUNTER — Encounter (HOSPITAL_COMMUNITY)
Admission: RE | Admit: 2017-12-28 | Discharge: 2017-12-28 | Disposition: A | Discharge status: Home / Self Care | Payer: Medicare HMO | Source: Ambulatory Visit | Attending: Pulmonary Disease | Admitting: Pulmonary Disease

## 2017-12-28 DIAGNOSIS — J45902 Unspecified asthma with status asthmaticus: Secondary | ICD-10-CM

## 2017-12-28 DIAGNOSIS — J449 Chronic obstructive pulmonary disease, unspecified: Secondary | ICD-10-CM

## 2017-12-28 NOTE — Progress Notes (Signed)
Daily Session Note  Patient Details  Name: Kathleen Hatfield MRN: [id number] Date of Birth: [birth year] Referring Provider:     Pulmonary Rehab Walk Test from 09/28/2017 in [place]  Referring Provider  Dr. Halford Chessman      Encounter Date: 12/28/2017  Check In: Session Check In - 12/28/17 1323      Check-In   Supervising physician immediately available to respond to emergencies  [facility] immediately available    Physician(s)  Dr. Ree Kida    Location  [facility]    Staff Present  Maurice Small, RN, BSN;Molly DiVincenzo, MS, ACSM RCEP, Exercise Physiologist;Dalton Kris Mouton, MS, Exercise Physiologist;Lisa Ysidro Evert, Felipe Drone, RN, MHA    Medication changes reported      No    Fall or balance concerns reported     No    Tobacco Cessation  No Change    Warm-up and Cool-down  Performed as group-led instruction    Resistance Training Performed  Yes    VAD Patient?  No    PAD/SET Patient?  No      Pain Assessment   Currently in Pain?  No/denies    Multiple Pain Sites  No       Capillary Blood Glucose: No results found for this or any previous visit (from the past 24 hour(s)).    Social History   Tobacco Use  Smoking Status Never Smoker  Smokeless Tobacco Never Used    Goals Met:  Proper associated with RPD/PD & O2 Sat Exercise tolerated well  Goals Unmet:  Not Applicable  Comments: Service time is from 1330 to 1500   Dr. Rush Farmer is Medical Director for Pulmonary Rehab at [facility].

## 2017-12-30 ENCOUNTER — Encounter (HOSPITAL_COMMUNITY): Payer: Medicare HMO

## 2018-01-04 ENCOUNTER — Encounter (HOSPITAL_COMMUNITY): Payer: Medicare HMO

## 2018-01-06 DIAGNOSIS — E1129 Type 2 diabetes mellitus with other diabetic kidney complication: Secondary | ICD-10-CM | POA: Diagnosis not present

## 2018-01-06 DIAGNOSIS — E782 Mixed hyperlipidemia: Secondary | ICD-10-CM | POA: Diagnosis not present

## 2018-01-13 DIAGNOSIS — G4733 Obstructive sleep apnea (adult) (pediatric): Secondary | ICD-10-CM | POA: Diagnosis not present

## 2018-01-13 DIAGNOSIS — E782 Mixed hyperlipidemia: Secondary | ICD-10-CM | POA: Diagnosis not present

## 2018-01-17 DIAGNOSIS — M25561 Pain in right knee: Secondary | ICD-10-CM | POA: Diagnosis not present

## 2018-01-17 DIAGNOSIS — M25551 Pain in right hip: Secondary | ICD-10-CM | POA: Diagnosis not present

## 2018-01-23 NOTE — Addendum Note (Signed)
Encounter addended by: Rowe Pavy, RN on: 01/23/2018 6:41 PM  Actions taken: Episode resolved, Flowsheet data copied forward, Visit Navigator Flowsheet section accepted, Sign clinical note

## 2018-01-23 NOTE — Progress Notes (Signed)
Discharge Progress Report  Patient Details  Name: Kathleen Hatfield MRN: [id number] Date of Birth: [birth year] Referring Provider:     Pulmonary Rehab Walk Test from 09/28/2017 in [place]  Referring Provider  Dr. Halford Chessman       Number of Visits: 24  Reason for Discharge:  Patient reached a stable level of exercise.  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 Halford Chessman      NuStep   Level  3    SPM  80    Minutes  17    METs  1.5      Arm Ergometer   Level  1    Watts  10    Minutes  17      Track   Laps  4    Minutes  17      Prescription Details   Frequency (times per week)  2    Duration  Progress to 45 minutes of aerobic exercise without signs/symptoms of physical distress      Intensity   THRR 40-80% of Max Heartrate  63-126    Ratings of Perceived Exertion  11-13    Perceived Dyspnea  0-4      Progression   Progression  Continue progressive overload as per policy without signs/symptoms or physical distress.      Resistance Training   Training Prescription  Yes    Weight  blue bands    Reps  10-15       Discharge Exercise Prescription (Final Exercise Prescription Changes): Exercise Prescription Changes - 12/21/17 1600      Response to Exercise   Blood Pressure (Admit)  124/70    Blood Pressure  (Exercise)  124/64    Blood Pressure (Exit)  130/60    Heart Rate (Admit)  83 bpm    Heart Rate (Exercise)  99 bpm    Heart Rate (Exit)  89 bpm    Oxygen Saturation (Admit)  93 %    Oxygen Saturation (Exercise)  93 %    Oxygen Saturation (Exit)  95 %    Rating of Perceived Exertion (Exercise)  12    Perceived Dyspnea (Exercise)  2    Duration  Continue with 45 min of aerobic exercise without signs/symptoms of physical distress.    Intensity  THRR unchanged      Progression   Progression  Continue to progress workloads to maintain intensity without signs/symptoms of physical distress.      Resistance Training   Training Prescription  Yes    Weight  blue bands    Reps  10-15    Time  10 Minutes      NuStep   Level  6    SPM  80    Minutes  51    METs  2       Functional Capacity: 6 Minute Walk    Row Name 09/30/17 0730 12/28/17 1539       6 Minute Walk   Phase  Initial  Discharge    Distance  631 feet  800 feet    Distance Feet Change  -  169 ft    Walk Time  - 4 minutes 25 seconds  - 3 minutes 40 seconds    # of Rest Breaks  2 1 minute 35 seconds total  - 1 minute seated, 1 minute 20 seconds standing    MPH  1.19  1.5    METS  1.92  2.15    RPE  15  20    Perceived Dyspnea   3  4    Symptoms  Yes (comment)  Yes (comment)    Comments  9/10 sciatica pain  8/10 chest tightness, 10/10 leg & butt pain    Resting HR  90 bpm  100 bpm    Resting BP  118/60  132/76    Resting Oxygen Saturation   90 %  95 %    Exercise Oxygen Saturation  during 6 min walk  90 %  92 %    Max Ex. HR  107 bpm  126 bpm    Max Ex. BP  140/58  200/80    2 Minute Post BP  -  124/80      Interval HR   1 Minute HR  106  113    2 Minute HR  107  113    3 Minute HR  106  122    4 Minute HR  104  121    5 Minute HR  104  126    6 Minute HR  104  126    2 Minute Post HR  95  104    Interval Heart Rate?  Yes  Yes      Interval Oxygen   Interval Oxygen?  Yes  Yes    Baseline Oxygen Saturation  %  90 %  -    1 Minute Oxygen Saturation %  91 %  95 %    1 Minute Liters of Oxygen  0 L  0 L    2 Minute Oxygen Saturation %  92 %  95 %    2 Minute Liters of Oxygen  0 L  0 L    3 Minute Oxygen Saturation %  93 %  92 %    3 Minute Liters of Oxygen  0 L  0 L    4 Minute Oxygen Saturation %  95 %  92 %    4 Minute Liters of Oxygen  0 L  0 L    5 Minute Oxygen Saturation %  95 %  96 %    5 Minute Liters of Oxygen  0 L  0 L    6 Minute Oxygen Saturation %  95 %  96 %    6 Minute Liters of Oxygen  0 L  0 L    2 Minute Post Oxygen Saturation %  95 %  97 %    2 Minute Post Liters of Oxygen  0 L  0 L       Psychological, QOL, Others - Outcomes: PHQ 2/9: Depression  screen [facility] 2/9 12/28/2017 09/13/2017 03/29/2017  Decreased Interest _0 Down, Depressed, Hopeless _1 PHQ - 2 Score _2 Altered sleeping _3 Tired, decreased energy _4 Change in appetite 0 1 1  Feeling bad or failure about yourself  _5 Trouble concentrating 0 3 3  Moving slowly or fidgety/restless _6 Suicidal thoughts 0 0 0  PHQ-9 Score _7 Difficult doing work/chores Very difficult Very difficult Extremely dIfficult    Quality of Life:   Personal Goals: Goals established at orientation with interventions provided to work toward goal. Personal Goals and Risk Factors at Admission - 09/13/17 1420      Core Components/Risk Factors/Patient Goals on Admission    Weight Management  Yes;Weight Loss    Intervention  Weight Management/Obesity: Establish reasonable short term and long term weight goals.;Obesity: Provide education and appropriate resources to help participant work on and attain dietary goals.;Weight Management: Provide education and appropriate resources to help participant work on and attain dietary goals.;Weight Management: Develop a combined nutrition and exercise program designed to reach desired caloric intake, while maintaining appropriate intake of nutrient and fiber, sodium and  fats, and appropriate energy expenditure required for the weight goal.    Admit Weight  270 lb 15.1 oz (122.9 kg)    Goal Weight: Short Term  260 lb (117.9 kg)    Goal Weight: Long Term  250 lb (113.4 kg)    Expected Outcomes  Long Term: Adherence to nutrition and physical activity/exercise program aimed toward attainment of established weight goal;Weight Loss: Understanding of general recommendations for a balanced deficit meal plan, which promotes 1-2 lb weight loss per week and includes a negative energy balance of [phone] kcal/d;Understanding recommendations for meals to include 15-35% energy as protein, 25-35% energy from fat, 35-60% energy from carbohydrates, less than 218m of dietary cholesterol, 20-35 gm of total fiber daily;Understanding of distribution of calorie intake throughout the day with the consumption of 4-5 meals/snacks    Improve shortness of breath with ADL's  Yes    Intervention  Provide education, individualized exercise plan and daily activity instruction to help decrease symptoms of SOB with activities of daily living.    Expected Outcomes  Short Term: Improve cardiorespiratory fitness to achieve a reduction of symptoms when performing ADLs;Long Term: Be able to perform more ADLs without symptoms or delay the onset of symptoms    Heart Failure  Yes    Intervention  Provide a combined exercise and nutrition program that is supplemented with education, support and counseling about heart failure. Directed toward relieving symptoms such as shortness of breath, decreased exercise tolerance, and extremity edema.    Expected Outcomes  Improve functional capacity of life;Short term: Attendance in program 2-3 days a week with increased exercise capacity. Reported lower sodium intake. Reported increased fruit and vegetable intake. Reports medication compliance.;Short term: Daily weights obtained and reported for increase. Utilizing diuretic protocols set by physician.;Long term: Adoption  of self-care skills and reduction of barriers for early signs and symptoms recognition and intervention leading to self-care maintenance.    Stress  Yes    Intervention  Offer individual and/or small group education and counseling on adjustment to heart disease, stress management and health-related lifestyle change. Teach and support self-help strategies.;Refer participants experiencing significant psychosocial distress to appropriate mental health specialists for further evaluation and treatment. When possible, include family members and significant others in  education/counseling sessions.    Expected Outcomes  Short Term: Participant demonstrates changes in health-related behavior, relaxation and other stress management skills, ability to obtain effective social support, and compliance with psychotropic medications if prescribed.;Long Term: Emotional wellbeing is indicated by absence of clinically significant psychosocial distress or social isolation.        Personal Goals Discharge: Goals and Risk Factor Review    Row Name 10/26/17 0851 10/26/17 0852 10/27/17 0955 11/24/17 1711 12/23/17 1043     Core Components/Risk Factors/Patient Goals Review   Personal Goals Review  Weight Management/Obesity;Improve shortness of breath with ADL's;Develop more efficient breathing techniques such as purse lipped breathing and diaphragmatic breathing and practicing self-pacing with activity.;Stress  Weight Management/Obesity;Improve shortness of breath with ADL's;Heart Failure;Stress;Diabetes  Weight Management/Obesity;Improve shortness of breath with ADL's;Heart Failure;Stress;Diabetes;Increase knowledge of respiratory medications and ability to use respiratory devices properly.;Develop more efficient breathing techniques such as purse lipped breathing and diaphragmatic breathing and practicing self-pacing with activity.  Weight Management/Obesity;Improve shortness of breath with ADL's;Heart  Failure;Stress;Diabetes;Increase knowledge of respiratory medications and ability to use respiratory devices properly.;Develop more efficient breathing techniques such as purse lipped breathing and diaphragmatic breathing and practicing self-pacing with activity.  Weight Management/Obesity;Improve shortness of breath with ADL's;Heart Failure;Stress;Diabetes;Increase knowledge of respiratory medications and ability to use respiratory devices properly.;Develop more efficient breathing techniques such as purse lipped breathing and diaphragmatic breathing and practicing self-pacing with activity.   Review  -  Alvena has completed 5 exercise classess with one incomplete due to elevation in CBG over 300.  Weight:  pt has shown increase in her weight of 1 kg since starting on 7/30.  This is due to pt inablitiy to increase her physical actiivty outside of Pulmonary rehab..  This is do in part with pt significant history of depression. Pt met with dietician on 8/13. PCP is tweaking her medications and this has been a difficult transition for pt.  Continue to encourage and support pt. Pt with inrease in nustep level to 4, Track remains at 6 laps in a 15 minute station. Arm crank is level 1.  Will try to increase pt workloads and have pt engage in activities that will provide pt opportunities for movement at home.  Expect to see some progress twoard pulmonary rehab goals over the next 30 days.  Kamala Kolton has completed 14 exercise classess with one incomplete due to elevation in CBG over 300.  Weight:  pt has shown decreaseof 1.3 in her weight since starting on 7/30.  This is due to pt inablitiy to increase her physical actiivty outside of Pulmonary rehab..  This is do in part with pt significant history of depression. and deconditioning. Pt met with dietician on 8/13. Hopeful we will begin to see measureable progress toward her goal.  Continue to encourage and support pt. Pt with increase in nustep level to 5, for 30 minutesTrack  increase to 9 laps in a 15 minute station.  Will try to increase pt workloads and have pt engage in activities that will provide pt opportunities for movement at home and increase social interaction.  Expect to see some progress twoard pulmonary rehab goals over the next 30 days.  Romesha has completed 22 exercise sessions and 9 education classes.   Weight:  pt has shown decreaseof 2.8kg  in her weight since starting on 7/30.  This is due to pt inablitiy to increase her physical actiivty outside of Pulmonary rehab. Pt has met with the dietician several times for consult on strategies  to lose weight.  Continue to encourage and support pt. Pt with increase in nustep level to 5, for 30 minutesTrack increase to 9 laps in a 15 minute station. Blood sugars remain within normal limits for exercise pre and post.  Pt adheres to no low salt diet and weighs herself daily.  Pt aware of when to contact the MD with fluid issues  Pt uses PLB on exertion.  Increased pt workload nustep level 6 for 45 minutes Will  have pt engage in any activities that will provide pt opportunities for movement at home and increase social interaction.  Anticiapte progress in pulmonary rehab goals over the next 30 days.   Expected Outcomes  see "admission" expected outcomes  see "admission" expected outcomes  See Admission Outcomes/Goals  See Admission Outcomes/Goals  See Admission Outcomes/Goals   Row Name 01/23/18 1811             Core Components/Risk Factors/Patient Goals Review   Personal Goals Review  Weight Management/Obesity;Improve shortness of breath with ADL's;Heart Failure;Stress;Diabetes;Increase knowledge of respiratory medications and ability to use respiratory devices properly.;Develop more efficient breathing techniques such as purse lipped breathing and diaphragmatic breathing and practicing self-pacing with activity.       Review  Pt graduates with the completion of 24 exercsie sessions.  Pt does not have any concrete plans  for exercise.  Pt has limited mobilitiy. Offered pulmonary maintennace, pt would like to participate and can exercsie in the spring after her car note has been paid off.       Expected Outcomes  Pt has made progress toward golas and outcomes.  Pt continues to have stress and at times displays a negative outlook.            Exercise Goals and Review: Exercise Goals    Row Name 09/13/17 1436             Exercise Goals   Increase Physical Activity  Yes       Intervention  Provide advice, education, support and counseling about physical activity/exercise needs.;Develop an individualized exercise prescription for aerobic and resistive training based on initial evaluation findings, risk stratification, comorbidities and participant's personal goals.       Expected Outcomes  Short Term: Attend rehab on a regular basis to increase amount of physical activity.;Long Term: Add in home exercise to make exercise part of routine and to increase amount of physical activity.;Long Term: Exercising regularly at least 3-5 days a week.       Increase Strength and Stamina  Yes       Intervention  Provide advice, education, support and counseling about physical activity/exercise needs.;Develop an individualized exercise prescription for aerobic and resistive training based on initial evaluation findings, risk stratification, comorbidities and participant's personal goals.       Expected Outcomes  Short Term: Increase workloads from initial exercise prescription for resistance, speed, and METs.;Short Term: Perform resistance training exercises routinely during rehab and add in resistance training at home;Long Term: Improve cardiorespiratory fitness, muscular endurance and strength as measured by increased METs and functional capacity (6MWT)       Able to understand and use rate of perceived exertion (RPE) scale  Yes       Intervention  Provide education and explanation on how to use RPE scale       Expected Outcomes   Short Term: Able to use RPE daily in rehab to express subjective intensity level;Long Term:  Able to use RPE to guide intensity level  when exercising independently       Able to understand and use Dyspnea scale  Yes       Intervention  Provide education and explanation on how to use Dyspnea scale       Expected Outcomes  Short Term: Able to use Dyspnea scale daily in rehab to express subjective sense of shortness of breath during exertion;Long Term: Able to use Dyspnea scale to guide intensity level when exercising independently       Knowledge and understanding of Target Heart Rate Range (THRR)  Yes       Intervention  Provide education and explanation of THRR including how the numbers were predicted and where they are located for reference       Expected Outcomes  Short Term: Able to state/look up THRR;Short Term: Able to use daily as guideline for intensity in rehab;Long Term: Able to use THRR to govern intensity when exercising independently       Intervention  Provide education, explanation, and written materials on patient's individual exercise prescription       Expected Outcomes  Short Term: Able to explain program exercise prescription;Long Term: Able to explain home exercise prescription to exercise independently          Exercise Goals Re-Evaluation: Exercise Goals Re-Evaluation    Row Name 10/26/17 [phone] 11/23/17 0947 12/23/17 0737         Exercise Goal Re-Evaluation   Exercise Goals Review  Increase Strength and Stamina;Able to understand and use Dyspnea scale;Able to understand and use rate of perceived exertion (RPE) scale;Increase Physical Activity;Knowledge and understanding of Target Heart Rate Range (THRR);Understanding of Exercise Prescription  Increase Strength and Stamina;Able to understand and use Dyspnea scale;Able to understand and use rate of perceived exertion (RPE) scale;Increase Physical Activity;Knowledge and understanding of Target Heart Rate Range  (THRR);Understanding of Exercise Prescription  Increase Strength and Stamina;Able to understand and use Dyspnea scale;Able to understand and use rate of perceived exertion (RPE) scale;Increase Physical Activity;Knowledge and understanding of Target Heart Rate Range (THRR);Understanding of Exercise Prescription     Comments  Patient has had a slow start to rehab. Very tearful. Somedays patient can only do non-weight bearing exercise due to pain. Her MET level places her in a low level. Will cont. to monitor and progress as able.   Patient is progressing slowly. Major barriers are chronic pain. She walks the track even though it is very difficult for her-this shows me she is trying to progress. Met level places her in a low level--I will discuss with patient how to increase levels. Will discuss home exercise and its importance. Cont. to monitor and motivate.  Patient is progressing slowly. Major barriers are chronic pain. She walks the track even though it is very difficult for her-this shows me she is trying to progress. Met level places her in a low level--I will discuss with patient how to increase levels. Will discuss home exercise and its importance. Patient will be graduating 12/28/17. Cont. to monitor and motivate.     Expected Outcomes  Through exercise at rehab and at home, patient will increase strength and stamina making ADL's easier to perform. Patient will also have a better understanding of safe exercise and what they are capable to do outside of clinical supervision.  Through exercise at rehab and at home, patient will increase strength and stamina making ADL's easier to perform. Patient will also have a better understanding of safe exercise and what they are capable to do outside of clinical  supervision.  Through exercise at rehab and at home, patient will increase strength and stamina making ADL's easier to perform. Patient will also have a better understanding of safe exercise and what they are  capable to do outside of clinical supervision.        Nutrition & Weight - Outcomes:    Nutrition: Nutrition Therapy & Goals - 10/05/17 1551      Nutrition Therapy   Diet  Carb Modified, Heart Healthy      Personal Nutrition Goals   Nutrition Goal  Identify food quantities necessary to achieve wt loss of  -2# per week to a goal wt loss of 6-24 lb at graduation from pulmonary rehab.    Personal Goal #2  personal goal for CBG's in the normal range not met, as identified by CBG's pre and post exercise      Intervention Plan   Intervention  Prescribe, educate and counsel regarding individualized specific dietary modifications aiming towards targeted core components such as weight, hypertension, lipid management, diabetes, heart failure and other comorbidities.    Expected Outcomes  Short Term Goal: Understand basic principles of dietary content, such as calories, fat, sodium, cholesterol and nutrients.;Long Term Goal: Adherence to prescribed nutrition plan.       Nutrition Discharge: Nutrition Assessments - 12/31/17 1024      Rate Your Plate Scores   Pre Score  43    Post Score  31       Education Questionnaire Score: Knowledge Questionnaire Score - 12/22/17 0906      Knowledge Questionnaire Score   Post Score  18/18       Goals reviewed with patient. Cherre Huger, BSN Cardiac and Training and development officer

## 2018-02-05 DIAGNOSIS — E1129 Type 2 diabetes mellitus with other diabetic kidney complication: Secondary | ICD-10-CM | POA: Diagnosis not present

## 2018-02-05 DIAGNOSIS — N289 Disorder of kidney and ureter, unspecified: Secondary | ICD-10-CM | POA: Diagnosis not present

## 2018-02-05 DIAGNOSIS — I10 Essential (primary) hypertension: Secondary | ICD-10-CM | POA: Diagnosis not present

## 2018-02-05 DIAGNOSIS — E782 Mixed hyperlipidemia: Secondary | ICD-10-CM | POA: Diagnosis not present

## 2018-02-09 ENCOUNTER — Telehealth: Payer: Self-pay | Admitting: Adult Health

## 2018-02-09 NOTE — Telephone Encounter (Signed)
Spoke with Kathleen Hatfield to confirm pt's meds, allergies, and blood pressure.  Advised that we have not seen pt since 07/2017 so this information may not be presently accurate.  Philippe expressed understanding.  Nothing further needed at this time.

## 2018-02-15 DIAGNOSIS — G4733 Obstructive sleep apnea (adult) (pediatric): Secondary | ICD-10-CM | POA: Diagnosis not present

## 2018-02-16 DIAGNOSIS — E1129 Type 2 diabetes mellitus with other diabetic kidney complication: Secondary | ICD-10-CM | POA: Diagnosis not present

## 2018-02-17 DIAGNOSIS — N183 Chronic kidney disease, stage 3 (moderate): Secondary | ICD-10-CM | POA: Diagnosis not present

## 2018-03-08 DIAGNOSIS — I10 Essential (primary) hypertension: Secondary | ICD-10-CM | POA: Diagnosis not present

## 2018-03-08 DIAGNOSIS — K219 Gastro-esophageal reflux disease without esophagitis: Secondary | ICD-10-CM | POA: Diagnosis not present

## 2018-03-08 DIAGNOSIS — E782 Mixed hyperlipidemia: Secondary | ICD-10-CM | POA: Diagnosis not present

## 2018-03-10 DIAGNOSIS — N183 Chronic kidney disease, stage 3 (moderate): Secondary | ICD-10-CM | POA: Diagnosis not present

## 2018-03-10 DIAGNOSIS — Z6841 Body Mass Index (BMI) 40.0 and over, adult: Secondary | ICD-10-CM | POA: Diagnosis not present

## 2018-03-10 DIAGNOSIS — E1122 Type 2 diabetes mellitus with diabetic chronic kidney disease: Secondary | ICD-10-CM | POA: Diagnosis not present

## 2018-03-10 DIAGNOSIS — Z794 Long term (current) use of insulin: Secondary | ICD-10-CM | POA: Diagnosis not present

## 2018-03-10 DIAGNOSIS — I129 Hypertensive chronic kidney disease with stage 1 through stage 4 chronic kidney disease, or unspecified chronic kidney disease: Secondary | ICD-10-CM | POA: Diagnosis not present

## 2018-03-18 DIAGNOSIS — G4733 Obstructive sleep apnea (adult) (pediatric): Secondary | ICD-10-CM | POA: Diagnosis not present

## 2018-03-24 DIAGNOSIS — K5190 Ulcerative colitis, unspecified, without complications: Secondary | ICD-10-CM | POA: Diagnosis not present

## 2018-03-24 DIAGNOSIS — J019 Acute sinusitis, unspecified: Secondary | ICD-10-CM | POA: Diagnosis not present

## 2018-03-24 DIAGNOSIS — Z6841 Body Mass Index (BMI) 40.0 and over, adult: Secondary | ICD-10-CM | POA: Diagnosis not present

## 2018-03-24 DIAGNOSIS — B9689 Other specified bacterial agents as the cause of diseases classified elsewhere: Secondary | ICD-10-CM | POA: Diagnosis not present

## 2018-04-06 DIAGNOSIS — E782 Mixed hyperlipidemia: Secondary | ICD-10-CM | POA: Diagnosis not present

## 2018-04-06 DIAGNOSIS — N289 Disorder of kidney and ureter, unspecified: Secondary | ICD-10-CM | POA: Diagnosis not present

## 2018-04-07 DIAGNOSIS — E1129 Type 2 diabetes mellitus with other diabetic kidney complication: Secondary | ICD-10-CM | POA: Diagnosis not present

## 2018-04-07 DIAGNOSIS — E782 Mixed hyperlipidemia: Secondary | ICD-10-CM | POA: Diagnosis not present

## 2018-04-07 DIAGNOSIS — J449 Chronic obstructive pulmonary disease, unspecified: Secondary | ICD-10-CM | POA: Diagnosis not present

## 2018-04-18 DIAGNOSIS — G4733 Obstructive sleep apnea (adult) (pediatric): Secondary | ICD-10-CM | POA: Diagnosis not present

## 2018-04-25 DIAGNOSIS — E782 Mixed hyperlipidemia: Secondary | ICD-10-CM | POA: Diagnosis not present

## 2018-04-25 DIAGNOSIS — J449 Chronic obstructive pulmonary disease, unspecified: Secondary | ICD-10-CM | POA: Diagnosis not present

## 2018-04-25 DIAGNOSIS — G2581 Restless legs syndrome: Secondary | ICD-10-CM | POA: Diagnosis not present

## 2018-04-25 DIAGNOSIS — Z0000 Encounter for general adult medical examination without abnormal findings: Secondary | ICD-10-CM | POA: Diagnosis not present

## 2018-04-25 DIAGNOSIS — N289 Disorder of kidney and ureter, unspecified: Secondary | ICD-10-CM | POA: Diagnosis not present

## 2018-04-25 DIAGNOSIS — K219 Gastro-esophageal reflux disease without esophagitis: Secondary | ICD-10-CM | POA: Diagnosis not present

## 2018-04-25 DIAGNOSIS — I10 Essential (primary) hypertension: Secondary | ICD-10-CM | POA: Diagnosis not present

## 2018-04-25 DIAGNOSIS — E1165 Type 2 diabetes mellitus with hyperglycemia: Secondary | ICD-10-CM | POA: Diagnosis not present

## 2018-04-25 DIAGNOSIS — M150 Primary generalized (osteo)arthritis: Secondary | ICD-10-CM | POA: Diagnosis not present

## 2018-04-25 DIAGNOSIS — E1129 Type 2 diabetes mellitus with other diabetic kidney complication: Secondary | ICD-10-CM | POA: Diagnosis not present

## 2018-05-07 DIAGNOSIS — E1129 Type 2 diabetes mellitus with other diabetic kidney complication: Secondary | ICD-10-CM | POA: Diagnosis not present

## 2018-05-07 DIAGNOSIS — I10 Essential (primary) hypertension: Secondary | ICD-10-CM | POA: Diagnosis not present

## 2018-05-13 ENCOUNTER — Encounter (HOSPITAL_COMMUNITY): Payer: Self-pay

## 2018-05-13 ENCOUNTER — Other Ambulatory Visit: Payer: Self-pay

## 2018-05-13 ENCOUNTER — Emergency Department (HOSPITAL_COMMUNITY): Payer: Medicare HMO

## 2018-05-13 ENCOUNTER — Emergency Department (HOSPITAL_COMMUNITY)
Admission: EM | Admit: 2018-05-13 | Discharge: 2018-05-13 | Disposition: A | Discharge status: Home / Self Care | Payer: Medicare HMO | Attending: Emergency Medicine | Admitting: Emergency Medicine

## 2018-05-13 DIAGNOSIS — Z794 Long term (current) use of insulin: Secondary | ICD-10-CM | POA: Diagnosis not present

## 2018-05-13 DIAGNOSIS — R0600 Dyspnea, unspecified: Secondary | ICD-10-CM | POA: Diagnosis not present

## 2018-05-13 DIAGNOSIS — E119 Type 2 diabetes mellitus without complications: Secondary | ICD-10-CM | POA: Diagnosis not present

## 2018-05-13 DIAGNOSIS — N189 Chronic kidney disease, unspecified: Secondary | ICD-10-CM | POA: Diagnosis not present

## 2018-05-13 DIAGNOSIS — R072 Precordial pain: Secondary | ICD-10-CM

## 2018-05-13 DIAGNOSIS — I130 Hypertensive heart and chronic kidney disease with heart failure and stage 1 through stage 4 chronic kidney disease, or unspecified chronic kidney disease: Secondary | ICD-10-CM | POA: Diagnosis not present

## 2018-05-13 DIAGNOSIS — Z79899 Other long term (current) drug therapy: Secondary | ICD-10-CM | POA: Insufficient documentation

## 2018-05-13 DIAGNOSIS — R079 Chest pain, unspecified: Secondary | ICD-10-CM | POA: Diagnosis not present

## 2018-05-13 DIAGNOSIS — I5032 Chronic diastolic (congestive) heart failure: Secondary | ICD-10-CM | POA: Diagnosis not present

## 2018-05-13 DIAGNOSIS — R0789 Other chest pain: Secondary | ICD-10-CM | POA: Diagnosis not present

## 2018-05-13 DIAGNOSIS — R0602 Shortness of breath: Secondary | ICD-10-CM | POA: Diagnosis not present

## 2018-05-13 DIAGNOSIS — M25511 Pain in right shoulder: Secondary | ICD-10-CM | POA: Diagnosis not present

## 2018-05-13 LAB — CBC
HCT: 41.2 % (ref 36.0–46.0)
Hemoglobin: 12.8 g/dL (ref 12.0–15.0)
MCH: 26.8 pg (ref 26.0–34.0)
MCHC: 31.1 g/dL (ref 30.0–36.0)
MCV: 86.2 fL (ref 80.0–100.0)
Platelets: 384 10*3/uL (ref 150–400)
RBC: 4.78 MIL/uL (ref 3.87–5.11)
RDW: 16.2 % — ABNORMAL HIGH (ref 11.5–15.5)
WBC: 10.9 10*3/uL — ABNORMAL HIGH (ref 4.0–10.5)
nRBC: 0 % (ref 0.0–0.2)

## 2018-05-13 LAB — BASIC METABOLIC PANEL
Anion gap: 8 (ref 5–15)
BUN: 22 mg/dL (ref 8–23)
CO2: 32 mmol/L (ref 22–32)
Calcium: 9.8 mg/dL (ref 8.9–10.3)
Chloride: 100 mmol/L (ref 98–111)
Creatinine, Ser: 1.19 mg/dL — ABNORMAL HIGH (ref 0.44–1.00)
GFR calc Af Amer: 56 mL/min — ABNORMAL LOW (ref >60)
GFR calc non Af Amer: 49 mL/min — ABNORMAL LOW (ref >60)
Glucose, Bld: 100 mg/dL — ABNORMAL HIGH (ref 70–99)
Potassium: 3.6 mmol/L (ref 3.5–5.1)
Sodium: 140 mmol/L (ref 135–145)

## 2018-05-13 LAB — I-STAT TROPONIN, ED
Troponin i, poc: 0.01 ng/mL (ref 0.00–0.08)
Troponin i, poc: 0.01 ng/mL (ref 0.00–0.08)

## 2018-05-13 MED ORDER — SODIUM CHLORIDE 0.9% FLUSH: 3.0000 mL | Freq: Once | INTRAVENOUS | Status: DC

## 2018-05-13 MED ORDER — ACETAMINOPHEN 500 MG PO TABS
1000.0000 mg | ORAL_TABLET | Freq: Once | ORAL | Status: AC
Start: 2018-05-13 — End: 2018-05-13
  Administered 2018-05-13: 1000 mg via ORAL
  Filled 2018-05-13: qty 2

## 2018-05-13 NOTE — Discharge Instructions (Addendum)
It was our pleasure to provide your ER care today - we hope that you feel better.  For chest discomfort, follow up with cardiologist in 1 week.  Return to ER if worse, new symptoms, high fevers, persistent/recurrent chest pain, trouble breathing, other concern.

## 2018-05-13 NOTE — ED Provider Notes (Signed)
[facility] EMERGENCY DEPARTMENT Provider Note   CSN: [id number] Arrival date & time: 05/13/18  1544    History   Chief Complaint Chief Complaint  Patient presents with  . Chest Pain    HPI Kathleen Hatfield is a 63 y.o. female.     Patient c/o mid to right chest pain for the past 3 days. Pain gradual onset, moderate, persistent, constant, non radiating, not pleuritic. Occurs at rest. No relations to activity or exertion. Occasional non prod cough. No sore throat. No fevers. Denies abd pain. No nv. Remote hx cholecystectomy. Denies heartburn/indigestion. No chest wall strain. Denies leg pain or swelling. No hx cad, or fam hx premature cad. No hx dvt or pe. No recent surgery, immobility, trauma.   The history is provided by the patient.  Chest Pain  Associated symptoms: cough   Associated symptoms: no abdominal pain, no back pain, no fever, no headache, no shortness of breath and no vomiting     Past Medical History:  Diagnosis Date  . Anemia   . Arthritis   . Asthma   . CHF (congestive heart failure) ([place]) 05/2014  . Chronic diastolic heart failure ([place])   . CKD (chronic kidney disease)   . Depression   . Diabetes mellitus without complication ([place])   . Diverticulitis   . Dyspnea    with exertion  . GERD (gastroesophageal reflux disease)   . History of blood transfusion   . Hyperlipidemia    cannot tolerate statins  . Hypertension   . Peripheral neuropathy   . Pneumonia   . PONV (postoperative nausea and vomiting)   . Sleep apnea    uses CPAP  . Ulcerative colitis [facility])    dr Collene Mares    Patient Active Problem List   Diagnosis Date Noted  . Insulin-requiring or dependent type II diabetes mellitus ([place])   . COPD with acute exacerbation ([place]) 05/03/2017  . Influenza A   . Renal insufficiency   . Acute hypoxemic respiratory failure ([place]) 04/03/2016  . Acute bronchitis 04/02/2016  . Acute renal failure superimposed on stage 2 chronic kidney disease  ([place]) 04/02/2016  . Respiratory distress 04/02/2016  . Diabetes mellitus without complication ([place])   . CKD (chronic kidney disease)   . Stage 2 chronic kidney disease   . Solitary pulmonary nodule 11/20/2015  . Urinary retention 11/18/2015  . Hypoxemia 11/18/2015  . Incisional hernia s/p lap repair with mesh 11/15/2015 11/15/2015  . Hypersomnia with sleep apnea 09/16/2015  . Fatigue 06/07/2015  . Morbid obesity ([place]) 06/07/2015  . Cough 04/18/2015  . Folliculitis 56/38/9373  . Chronic diastolic heart failure ([place])   . OSA (obstructive sleep apnea) 08/23/2012  . Hypertension   . Depression   . GERD (gastroesophageal reflux disease)   . Hyperlipidemia   . Mild persistent chronic asthma without complication   . Dyspnea   . Ulcerative colitis ([place])   . Patellar tendinitis 05/25/2011  . Knee pain 05/25/2011  . Myofascial pain 05/25/2011  . Cervicalgia 05/25/2011    Past Surgical History:  Procedure Laterality Date  . ABDOMINAL HYSTERECTOMY    . APPENDECTOMY    . BREAST SURGERY     left biopsy  . CARDIAC CATHETERIZATION    . CESAREAN SECTION    . CHOLECYSTECTOMY    . HERNIA REPAIR    . INSERTION OF MESH N/A 11/15/2015   Procedure: INSERTION OF MESH;  Surgeon: [name], MD;  Location: [facility] OR[facility];  Service: General;  Laterality:  N/A;  . LAPAROSCOPIC LYSIS OF ADHESIONS N/A 11/15/2015   Procedure: LAPAROSCOPIC LYSIS OF ADHESIONS;  Surgeon: [name], MD;  Location: [facility];  Service: General;  Laterality: N/A;  . RIGHT HEART CATHETERIZATION N/A 02/27/2013   Procedure: RIGHT HEART CATH;  Surgeon: Blane Ohara, MD;  Location: [facility] CATH LAB;  Service: Cardiovascular;  Laterality: N/A;  . RIGHT HEART CATHETERIZATION N/A 12/14/2013   Procedure: RIGHT HEART CATH;  Surgeon: Larey Dresser, MD;  Location: [facility] CATH LAB;  Service: Cardiovascular;  Laterality: N/A;  . SIGMOIDECTOMY  2010   diverticular disease  . VENTRAL HERNIA REPAIR N/A 11/15/2015   Procedure: LAPAROSCOPIC VENTRAL WALL  HERNIA REPAIR;  Surgeon: [name], MD;  Location: [facility]S;  Service: General;  Laterality: N/A;     OB History   No obstetric history on file.      Home Medications    Prior to Admission medications   Medication Sig Start Date End Date Taking? Authorizing Provider  Aclidinium Bromide (TUDORZA PRESSAIR) 400 MCG/ACT AEPB Inhale 1 puff into the lungs 2 (two) times daily. 07/30/17   [name], MD  albuterol (PROAIR HFA) 108 (90 Base) MCG/ACT inhaler INHALE 2 PUFFS INTO THE LUNGS EVERY FOUR HOURS AS NEEDED FOR WHEEZING. 05/21/17   Parrett, Fonnie Mu, NP  BAYER CONTOUR NEXT TEST test strip As directed 07/30/15   [provider]  budesonide-formoterol (SYMBICORT) 160-4.5 MCG/ACT inhaler Inhale 2 puffs into the lungs 2 (two) times daily. 05/21/17   Parrett, Fonnie Mu, NP  Cholecalciferol (VITAMIN D-3) 5000 UNITS TABS Take 5,000 Units by mouth daily.     [provider]  DULoxetine (CYMBALTA) 60 MG capsule Take 60 mg by mouth daily.     [provider]  fenofibrate (TRICOR) 145 MG tablet Take 145 mg by mouth at bedtime.  09/28/14   [provider]  furosemide (LASIX) 40 MG tablet Take 40 mg by mouth daily. 09/13/17  Pt takes 1 1/2 daily    [provider]  gabapentin (NEURONTIN) 100 MG capsule Take 200-400 mg by mouth See admin instructions. Take 3 capsules at bedtime    [provider]  GLOBAL EASE INJECT PEN NEEDLES 32G X 4 MM MISC  06/19/15   [provider]  guaiFENesin-dextromethorphan (ROBITUSSIN DM) 100-10 MG/5ML syrup Take 5 mLs by mouth every 6 (six) hours as needed for cough. 05/06/17   Hongalgi, Lenis Dickinson, MD  Insulin Degludec (TRESIBA FLEXTOUCH) 200 UNIT/ML SOPN Inject 74 Units into the skin daily.    [provider]  Liraglutide (VICTOZA) 18 MG/3ML SOPN Inject 1.8 mg into the skin daily with lunch.     [provider]  lisinopril (PRINIVIL,ZESTRIL) 10 MG tablet Take 10 mg by mouth daily.    [provider]   metolazone (ZAROXOLYN) 5 MG tablet Take 5 mg by mouth daily as needed (Take with Lasix when weight is elevated 3 pounds overnight and 5 pounds or more for 2 days).    [provider]  MICROLET LANCETS MISC As directed 07/30/15   [provider]  pantoprazole (PROTONIX) 40 MG tablet Take 40 mg by mouth daily.  04/14/12   [provider]  potassium chloride (K-DUR,KLOR-CON) 10 MEQ tablet Take 10 mEq by mouth 2 (two) times daily.    [provider]  rOPINIRole (REQUIP) 4 MG tablet Take 4 mg by mouth 2 (two) times daily. 7/8 Pt take 1 in the morning and 1 1/2 mg at bedtime    [provider]  rosuvastatin (CRESTOR) 10 MG tablet Take 10 mg by mouth once a week.    [provider]  UNABLE TO FIND CPAP at bedtime    [provider]    Family History Family History  Problem Relation Age of Onset  . Heart disease Mother   . Hypertension Mother   . Dementia Mother   . Heart disease Father   . COPD Father   . Hypertension Father     Social History Social History   Tobacco Use  . Smoking status: Never Smoker  . Smokeless tobacco: Never Used  Substance Use Topics  . Alcohol use: No  . Drug use: No     Allergies   Almond oil; Morphine and related; Ceclor [cefaclor]; Ciprofloxacin; Levaquin [levofloxacin]; and Sulfa antibiotics   Review of Systems Review of Systems  Constitutional: Negative for fever.  HENT: Negative for sore throat.   Eyes: Negative for redness.  Respiratory: Positive for cough. Negative for shortness of breath.   Cardiovascular: Positive for chest pain. Negative for leg swelling.  Gastrointestinal: Negative for abdominal pain, diarrhea and vomiting.  Genitourinary: Negative for flank pain.  Musculoskeletal: Negative for back pain and neck pain.  Skin: Negative for rash.  Neurological: Negative for headaches.  Hematological: Does not bruise/bleed easily.  Psychiatric/Behavioral: Negative for confusion.       Physical Exam Updated Vital Signs BP 125/72 (BP Location: Left Arm)   Pulse 71   Temp 98 F (36.7 C) (Oral)   Resp 18   SpO2 94%   Physical Exam Vitals signs and nursing note reviewed.  Constitutional:      Appearance: Normal appearance. She is well-developed.  HENT:     Head: Atraumatic.     Nose: Nose normal.     Mouth/Throat:     Mouth: Mucous membranes are moist.  Eyes:     General: No scleral icterus.    Conjunctiva/sclera: Conjunctivae normal.     Pupils: Pupils are equal, round, and reactive to light.  Neck:     Musculoskeletal: Normal range of motion and neck supple. No neck rigidity or muscular tenderness.     Trachea: No tracheal deviation.  Cardiovascular:     Rate and Rhythm: Normal rate and regular rhythm.     Pulses: Normal pulses.     Heart sounds: Normal heart sounds. No murmur. No friction rub. No gallop.   Pulmonary:     Effort: Pulmonary effort is normal. No respiratory distress.     Breath sounds: Normal breath sounds.  Chest:     Chest wall: No tenderness.  Abdominal:     General: Bowel sounds are normal. There is no distension.     Palpations: Abdomen is soft.     Tenderness: There is no abdominal tenderness. There is no guarding.  Genitourinary:    Comments: No cva tenderness.  Musculoskeletal:        General: No swelling or tenderness.  Skin:    General: Skin is warm and dry.     Findings: No rash.  Neurological:     Mental Status: She is alert.     Comments: Alert, speech normal. Steady gait.   Psychiatric:        Mood and Affect: Mood normal.      ED Treatments / Results  Labs (all labs ordered are listed, but only abnormal results are displayed) Results for orders placed or performed during the hospital encounter of 02/54/27  Basic metabolic panel  Result Value Ref  Range   Sodium 140 135 - 145 mmol/L   Potassium 3.6 3.5 - 5.1 mmol/L   Chloride 100 98 - 111 mmol/L   CO2 32 22 - 32 mmol/L   Glucose, Bld 100 (H) 70 - 99  mg/dL   BUN 22 8 - 23 mg/dL   Creatinine, Ser 1.19 (H) 0.44 - 1.00 mg/dL   Calcium 9.8 8.9 - 10.3 mg/dL   GFR calc non Af Amer 49 (L) >60 mL/min   GFR calc Af Amer 56 (L) >60 mL/min   Anion gap 8 5 - 15  CBC  Result Value Ref Range   WBC 10.9 (H) 4.0 - 10.5 K/uL   RBC 4.78 3.87 - 5.11 MIL/uL   Hemoglobin 12.8 12.0 - 15.0 g/dL   HCT 41.2 36.0 - 46.0 %   MCV 86.2 80.0 - 100.0 fL   MCH 26.8 26.0 - 34.0 pg   MCHC 31.1 30.0 - 36.0 g/dL   RDW 16.2 (H) 11.5 - 15.5 %   Platelets 384 150 - 400 K/uL   nRBC 0.0 0.0 - 0.2 %  I-stat troponin, ED  Result Value Ref Range   Troponin i, poc 0.01 0.00 - 0.08 ng/mL   Comment 3           Dg Chest 2 View  Result Date: 05/13/2018 CLINICAL DATA:  Right-sided chest pain.  Shortness of breath. EXAM: CHEST - 2 VIEW COMPARISON:  Two-view chest x-ray 07/30/2017. FINDINGS: The heart is enlarged. There is no edema or effusion. No focal airspace disease is present. The visualized soft tissues and bony thorax are remarkable for degenerative changes at the [facility] joints and in the cervical spine. IMPRESSION: Cardiomegaly without failure.  No acute cardiopulmonary disease. Electronically Signed   By: San Morelle M.D.   On: 05/13/2018 18:11    EKG EKG Interpretation  Date/Time:  Friday May 13 2018 15:53:36 EST Ventricular Rate:  76 PR Interval:  194 QRS Duration: 124 QT Interval:  436 QTC Calculation: 490 R Axis:   -76 Text Interpretation:  Normal sinus rhythm Right bundle branch block Left anterior fascicular block  Bifascicular blockNo significant change since last tracing Confirmed by Lajean Saver [phone]) on 05/13/2018 6:33:56 PM   Radiology Dg Chest 2 View  Result Date: 05/13/2018 CLINICAL DATA:  Right-sided chest pain.  Shortness of breath. EXAM: CHEST - 2 VIEW COMPARISON:  Two-view chest x-ray 07/30/2017. FINDINGS: The heart is enlarged. There is no edema or effusion. No focal airspace disease is present. The visualized soft tissues and bony  thorax are remarkable for degenerative changes at the [facility] joints and in the cervical spine. IMPRESSION: Cardiomegaly without failure.  No acute cardiopulmonary disease. Electronically Signed   By: San Morelle M.D.   On: 05/13/2018 18:11    Procedures Procedures (including critical care time)  Medications Ordered in ED Medications  sodium chloride flush (NS) 0.9 % injection 3 mL (has no administration in time range)     Initial Impression / Assessment and Plan / ED Course  I have reviewed the triage vital signs and the nursing notes.  Pertinent labs & imaging results that were available during my care of the patient were reviewed by me and considered in my medical decision making (see chart for details).  Labs. Ecg. Cxr.  ecg is unchanged from prior.   Reviewed nursing notes and prior charts for additional history.   Labs reviewed - after symptoms for past 3 days, constant, trop is normal. Symptoms/eval do not appear  c/w ACS.   cxr reviewed - no pna.   Given recent non prod cough, possibly related, ?msk pain.   Will given acetaminophen for pain (pt indicates had not taken any meds yet today for pain).   Patients vitals normal, afebrile, normal exam - pt currently appears stable for d/c.   rec close outpt pcp f/u.     Final Clinical Impressions(s) / ED Diagnoses   Final diagnoses:  None    ED Discharge Orders    None       Lajean Saver, MD 05/13/18 [phone]

## 2018-05-13 NOTE — ED Triage Notes (Signed)
Pt reports right side chest pain described as heaviness. Pt reports SOB at baseline but worse recently- not improved by neb treatments. Skin warm and dry. No distress noted.

## 2018-05-13 NOTE — ED Notes (Signed)
Patient verbalizes understanding of discharge instructions. Opportunity for questioning and answers were provided. Armband removed by staff, pt discharged from ED.  

## 2018-05-18 ENCOUNTER — Other Ambulatory Visit: Payer: Self-pay

## 2018-05-18 ENCOUNTER — Encounter: Payer: Self-pay | Admitting: Physician Assistant

## 2018-05-18 ENCOUNTER — Ambulatory Visit: Payer: Medicare HMO | Admitting: Physician Assistant

## 2018-05-18 VITALS — BP 130/80 | HR 97 | Ht 65.0 in | Wt 265.8 lb

## 2018-05-18 DIAGNOSIS — R0789 Other chest pain: Secondary | ICD-10-CM | POA: Diagnosis not present

## 2018-05-18 DIAGNOSIS — I5032 Chronic diastolic (congestive) heart failure: Secondary | ICD-10-CM | POA: Diagnosis not present

## 2018-05-18 DIAGNOSIS — I10 Essential (primary) hypertension: Secondary | ICD-10-CM | POA: Diagnosis not present

## 2018-05-18 NOTE — Progress Notes (Signed)
Cardiology Office Note    Date:  05/18/2018   ID:  Kathleen Hatfield, DOB [birth year], MRN [id number]  PCP:  Serita Grammes, MD  Cardiologist: Dr. Burt Knack  Chief Complaint: 15  Months follow up  History of Present Illness:   Kathleen Hatfield is a 63 y.o. female chronic diastolic heart failure with comorbidities including morbid obesity, obstructive sleep apnea, and chronic lung disease/asthma presents for follow up.   Last seen by Dr. Burt Knack 02/22/17. It was suspected that dyspnea is primarily related to obesity, deconditioning, OSA, and chronic lung disease.   Seen in ER 05/13/18 for chest pain. Ruled out. Discharge from ER.   Here today for follow up.  Patient has intermittent right-sided chest pressure.  Sometimes exacerbated with activity.  She has chronic dyspnea.  She is dealing with sinus and chest congestion.  Denies orthopnea, PND, syncope, lower extremity edema or melena.   Past Medical History:  Diagnosis Date  . Anemia   . Arthritis   . Asthma   . CHF (congestive heart failure) ([place]) 05/2014  . Chronic diastolic heart failure (Kathleen Hatfield)   . CKD (chronic kidney disease)   . Depression   . Diabetes mellitus without complication ([place]leen [place])   . Diverticulitis   . Dyspnea    with exertion  . GERD (gastroesophageal reflux disease)   . History of blood transfusion   . Hyperlipidemia    cannot tolerate statins  . Hypertension   . Peripheral neuropathy   . Pneumonia   . PONV (postoperative nausea and vomiting)   . Sleep apnea    uses CPAP  . Ulcerative colitis ([place]athleen Hatfield)    dr Collene Mares    Past Surgical History:  Procedure Laterality Date  . ABDOMINAL HYSTERECTOMY    . APPENDECTOMY    . BREAST SURGERY     left biopsy  . CARDIAC CATHETERIZATION    . CESAREAN SECTION    . CHOLECYSTECTOMY    . HERNIA REPAIR    . INSERTION OF MESH N/A 11/15/2015   Procedure: INSERTION OF MESH;  Surgeon: [name], MD;  Location: [facility];  Service: General;  Laterality: N/A;  . LAPAROSCOPIC LYSIS OF  ADHESIONS N/A 11/15/2015   Procedure: LAPAROSCOPIC LYSIS OF ADHESIONS;  Surgeon: [name], MD;  Location: [facility];  Service: General;  Laterality: N/A;  . RIGHT HEART CATHETERIZATION N/A 02/27/2013   Procedure: RIGHT HEART CATH;  Surgeon: Blane Ohara, MD;  Location: [facility] CATH LAB;  Service: Cardiovascular;  Laterality: N/A;  . RIGHT HEART CATHETERIZATION N/A 12/14/2013   Procedure: RIGHT HEART CATH;  Surgeon: Larey Dresser, MD;  Location: [facility] CATH LAB;  Service: Cardiovascular;  Laterality: N/A;  . SIGMOIDECTOMY  2010   diverticular disease  . VENTRAL HERNIA REPAIR N/A 11/15/2015   Procedure: LAPAROSCOPIC VENTRAL WALL HERNIA REPAIR;  Surgeon: [name], MD;  Location: [facility] ORS;  Service: General;  Laterality: N/A;    Current Medications: Prior to Admission medications   Medication Sig Start Date End Date Taking? Authorizing Provider  acetaminophen (TYLENOL) 500 MG tablet Take 1,000 mg by mouth every 6 (six) hours as needed for pain.    [provider]  Aclidinium Bromide (TUDORZA PRESSAIR) 400 MCG/ACT AEPB Inhale 1 puff into the lungs 2 (two) times daily. Patient not taking: Reported on 05/13/2018 07/30/17   [name], MD  albuterol (PROAIR HFA) 108 (90 Base) MCG/ACT inhaler INHALE 2 PUFFS INTO THE LUNGS EVERY FOUR HOURS AS NEEDED FOR WHEEZING. Patient taking differently: Inhale 2 puffs  into the lungs every 4 (four) hours as needed for wheezing.  05/21/17   Parrett, Fonnie Mu, NP  BAYER CONTOUR NEXT TEST test strip As directed 07/30/15   [provider]  budesonide (PULMICORT) 0.5 MG/2ML nebulizer solution Take 2 mLs by nebulization daily as needed for shortness of breath or wheezing. 11/12/17   [provider]  budesonide-formoterol (SYMBICORT) 160-4.5 MCG/ACT inhaler Inhale 2 puffs into the lungs 2 (two) times daily. 05/21/17   Parrett, Fonnie Mu, NP  Cholecalciferol (VITAMIN D-3) 5000 UNITS TABS Take 5,000 Units by mouth daily.     [provider]   DULoxetine (CYMBALTA) 60 MG capsule Take 60 mg by mouth daily.     [provider]  furosemide (LASIX) 40 MG tablet Take 60 mg by mouth daily.     [provider]  gabapentin (NEURONTIN) 100 MG capsule Take 900 mg by mouth at bedtime.     [provider]  GLOBAL EASE INJECT PEN NEEDLES 32G X 4 MM MISC  06/19/15   [provider]  Insulin Degludec (TRESIBA FLEXTOUCH) 200 UNIT/ML SOPN Inject 76 Units into the skin daily.     [provider]  ipratropium-albuterol (DUONEB) 0.5-2.5 (3) MG/3ML SOLN Take 3 mLs by nebulization daily as needed for shortness of breath or wheezing. 11/12/17   [provider]  Liraglutide (VICTOZA) 18 MG/3ML SOPN Inject 1.8 mg into the skin daily with lunch.     [provider]  metolazone (ZAROXOLYN) 5 MG tablet Take 2.5 mg by mouth daily as needed (Take with Lasix when weight is elevated 3 pounds overnight and 5 pounds or more for 2 days).     [provider]  MICROLET LANCETS MISC As directed 07/30/15   [provider]  pantoprazole (PROTONIX) 40 MG tablet Take 40 mg by mouth daily.  04/14/12   [provider]  polyethylene glycol (MIRALAX / GLYCOLAX) packet Take 17 g by mouth daily as needed for constipation.    [provider]  potassium chloride (K-DUR,KLOR-CON) 10 MEQ tablet Take 10 mEq by mouth 2 (two) times daily.    [provider]  rOPINIRole (REQUIP) 4 MG tablet Take 4-6 mg by mouth See admin instructions. Take 73m in the morning and 662mat bedtime    [provider]  traMADol (ULTRAM) 50 MG tablet Take 50 mg by mouth daily as needed for pain.    [provider]  UNABLE TO FIND CPAP at bedtime    [provider]    Allergies:   Almond oil; Morphine and related; Atorvastatin; Ceclor [cefaclor]; Ciprofloxacin; Levaquin [levofloxacin]; Losartan; Statins; and Sulfa antibiotics   Social History   Socioeconomic History  . Marital status:  Single    Spouse name: Not on file  . Number of children: Not on file  . Years of education: Not on file  . Highest education level: Not on file  Occupational History  . Occupation: unemployed  Social Needs  . Financial resource strain: Not on file  . Food insecurity:    Worry: Not on file    Inability: Not on file  . Transportation needs:    Medical: Not on file    Non-medical: Not on file  Tobacco Use  . Smoking status: Never Smoker  . Smokeless tobacco: Never Used  Substance and Sexual Activity  . Alcohol use: No  . Drug use: No  . Sexual activity: Not on file  Lifestyle  . Physical activity:    Days  per week: Not on file    Minutes per session: Not on file  . Stress: Not on file  Relationships  . Social connections:    Talks on phone: Not on file    Gets together: Not on file    Attends religious service: Not on file    Active member of club or organization: Not on file    Attends meetings of clubs or organizations: Not on file    Relationship status: Not on file  Other Topics Concern  . Not on file  Social History Narrative  . Not on file     Family History:  The patient's family history includes COPD in her father; Dementia in her mother; Heart disease in her father and mother; Hypertension in her father and mother.  ROS:   Please see the history of present illness.    ROS All other systems reviewed and are negative.   PHYSICAL EXAM:   VS:  BP 130/80   Pulse 97   Ht 5' 5"  (1.651 m)   Wt 265 lb 12.8 oz (120.6 kg)   SpO2 95%   BMI 44.23 kg/m    GEN: Well nourished, well developed, in no acute distress  HEENT: normal  Neck: no JVD, carotid bruits, or masses Cardiac: RRR; no murmurs, rubs, or gallops,no edema  Respiratory:  clear to auscultation except wheezing on right middle lobe  GI: soft, nontender, nondistended, + BS MS: no deformity or atrophy  Skin: warm and dry, no rash Neuro:  Alert and Oriented x 3, Strength and sensation are intact Psych:  euthymic mood, full affect  Wt Readings from Last 3 Encounters:  05/18/18 265 lb 12.8 oz (120.6 kg)  12/21/17 268 lb 4.8 oz (121.7 kg)  12/07/17 273 lb 2.4 oz (123.9 kg)      Studies/Labs Reviewed:   EKG:  EKG is ordered today.  The ekg ordered today demonstrates normal sinus rhythm with chronic right bundle branch block  Recent Labs: 05/13/2018: BUN 22; Creatinine, Ser 1.19; Hemoglobin 12.8; Platelets 384; Potassium 3.6; Sodium 140   Lipid Panel    Component Value Date/Time   CHOL 236 (H) 03/25/2013 0500   TRIG 345 (H) 03/25/2013 0500   HDL 34 (L) 03/25/2013 0500   CHOLHDL 6.9 03/25/2013 0500   VLDL 69 (H) 03/25/2013 0500   LDLCALC 133 (H) 03/25/2013 0500    Additional studies/ records that were reviewed today include:   As summarized above    ASSESSMENT & PLAN:    1. Chest pressure, right-sided -Her symptoms not concerning for angina.  Dealing with chest and nasal congestion.  Noted right sided wheezing.  Checks x-ray in ER was normal.  Concerning for early bronchitis.  Advised to try albuterol.  If no improvement she will discuss with PCP.  I have offered stress test but she wants to defer for now.  2.  Chronic dyspnea -Probably multifactorial.  No change in years.  3.  Chronic diastolic heart failure -Euvolemic.  Continue current therapy.    Medication Adjustments/Labs and Tests Ordered: Current medicines are reviewed at length with the patient today.  Concerns regarding medicines are outlined above.  Medication changes, Labs and Tests ordered today are listed in the Patient Instructions below. Patient Instructions  Medication Instructions:  Your physician recommends that you continue on your current medications as directed. Please refer to the Current Medication list given to you today.   If you need a refill on your cardiac medications before your next appointment, please  call your pharmacy.   Lab work: NONE If you have labs (blood work) drawn today and  your tests are completely normal, you will receive your results only by: Marland Kitchen MyChart Message (if you have MyChart) OR . A paper copy in the mail If you have any lab test that is abnormal or we need to change your treatment, we will call you to review the results.  Testing/Procedures: NONE  Follow-Up: At [facility], you and your health needs are our priority.  As part of our continuing mission to provide you with exceptional heart care, we have created designated Provider Care Teams.  These Care Teams include your primary Cardiologist (physician) and Advanced Practice Providers (APPs -  Physician Assistants and Nurse Practitioners) who all work together to provide you with the care you need, when you need it. You will need a follow up appointment in:  12 months.  Please call our office 2 months in advance to schedule this appointment.  You may see DR. Burt Knack or one of the following Advanced Practice Providers on your designated Care Team: Richardson Dopp, PA-C [place], [place] . Daune Perch, NP  Any Other Special Instructions Will Be Listed Below (If Applicable).       Jarrett Soho, [place]  05/18/2018 2:42 PM    [facility] [place], [place],   [address] Phone: [phone]; Fax: [phone]

## 2018-05-18 NOTE — Patient Instructions (Signed)
Medication Instructions:  Your physician recommends that you continue on your current medications as directed. Please refer to the Current Medication list given to you today.   If you need a refill on your cardiac medications before your next appointment, please call your pharmacy.   Lab work: NONE If you have labs (blood work) drawn today and your tests are completely normal, you will receive your results only by: Marland Kitchen MyChart Message (if you have MyChart) OR . A paper copy in the mail If you have any lab test that is abnormal or we need to change your treatment, we will call you to review the results.  Testing/Procedures: NONE  Follow-Up: At [facility], you and your health needs are our priority.  As part of our continuing mission to provide you with exceptional heart care, we have created designated Provider Care Teams.  These Care Teams include your primary Cardiologist (physician) and Advanced Practice Providers (APPs -  Physician Assistants and Nurse Practitioners) who all work together to provide you with the care you need, when you need it. You will need a follow up appointment in:  12 months.  Please call our office 2 months in advance to schedule this appointment.  You may see DR. Burt Knack or one of the following Advanced Practice Providers on your designated Care Team: Richardson Dopp, PA-C Vin Kingston, [place] . Daune Perch, NP  Any Other Special Instructions Will Be Listed Below (If Applicable).

## 2018-05-19 DIAGNOSIS — G4733 Obstructive sleep apnea (adult) (pediatric): Secondary | ICD-10-CM | POA: Diagnosis not present

## 2018-05-26 DIAGNOSIS — E1165 Type 2 diabetes mellitus with hyperglycemia: Secondary | ICD-10-CM | POA: Diagnosis not present

## 2018-05-26 DIAGNOSIS — E1129 Type 2 diabetes mellitus with other diabetic kidney complication: Secondary | ICD-10-CM | POA: Diagnosis not present

## 2018-05-26 DIAGNOSIS — E669 Obesity, unspecified: Secondary | ICD-10-CM | POA: Diagnosis not present

## 2018-05-26 DIAGNOSIS — Z6841 Body Mass Index (BMI) 40.0 and over, adult: Secondary | ICD-10-CM | POA: Diagnosis not present

## 2018-05-26 DIAGNOSIS — E039 Hypothyroidism, unspecified: Secondary | ICD-10-CM | POA: Diagnosis not present

## 2018-05-26 DIAGNOSIS — F329 Major depressive disorder, single episode, unspecified: Secondary | ICD-10-CM | POA: Diagnosis not present

## 2018-05-26 DIAGNOSIS — E876 Hypokalemia: Secondary | ICD-10-CM | POA: Diagnosis not present

## 2018-05-26 DIAGNOSIS — J449 Chronic obstructive pulmonary disease, unspecified: Secondary | ICD-10-CM | POA: Diagnosis not present

## 2018-05-26 DIAGNOSIS — R69 Illness, unspecified: Secondary | ICD-10-CM | POA: Diagnosis not present

## 2018-05-30 ENCOUNTER — Ambulatory Visit: Payer: Medicare HMO | Admitting: Pulmonary Disease

## 2018-05-31 ENCOUNTER — Other Ambulatory Visit: Payer: Self-pay | Admitting: Adult Health

## 2018-05-31 DIAGNOSIS — E559 Vitamin D deficiency, unspecified: Secondary | ICD-10-CM | POA: Diagnosis not present

## 2018-05-31 DIAGNOSIS — E876 Hypokalemia: Secondary | ICD-10-CM | POA: Diagnosis not present

## 2018-05-31 DIAGNOSIS — E1129 Type 2 diabetes mellitus with other diabetic kidney complication: Secondary | ICD-10-CM | POA: Diagnosis not present

## 2018-05-31 DIAGNOSIS — E039 Hypothyroidism, unspecified: Secondary | ICD-10-CM | POA: Diagnosis not present

## 2018-05-31 DIAGNOSIS — Z79899 Other long term (current) drug therapy: Secondary | ICD-10-CM | POA: Diagnosis not present

## 2018-05-31 DIAGNOSIS — E782 Mixed hyperlipidemia: Secondary | ICD-10-CM | POA: Diagnosis not present

## 2018-06-01 DIAGNOSIS — M25551 Pain in right hip: Secondary | ICD-10-CM | POA: Diagnosis not present

## 2018-06-01 DIAGNOSIS — M25561 Pain in right knee: Secondary | ICD-10-CM | POA: Diagnosis not present

## 2018-06-07 DIAGNOSIS — I10 Essential (primary) hypertension: Secondary | ICD-10-CM | POA: Diagnosis not present

## 2018-06-07 DIAGNOSIS — E1129 Type 2 diabetes mellitus with other diabetic kidney complication: Secondary | ICD-10-CM | POA: Diagnosis not present

## 2018-06-13 DIAGNOSIS — E1129 Type 2 diabetes mellitus with other diabetic kidney complication: Secondary | ICD-10-CM | POA: Diagnosis not present

## 2018-06-13 DIAGNOSIS — R109 Unspecified abdominal pain: Secondary | ICD-10-CM | POA: Diagnosis not present

## 2018-06-13 DIAGNOSIS — K5909 Other constipation: Secondary | ICD-10-CM | POA: Diagnosis not present

## 2018-06-13 DIAGNOSIS — Z6841 Body Mass Index (BMI) 40.0 and over, adult: Secondary | ICD-10-CM | POA: Diagnosis not present

## 2018-06-15 ENCOUNTER — Ambulatory Visit: Payer: Medicare HMO | Admitting: Pulmonary Disease

## 2018-06-21 DIAGNOSIS — G4733 Obstructive sleep apnea (adult) (pediatric): Secondary | ICD-10-CM | POA: Diagnosis not present

## 2018-07-07 DIAGNOSIS — R69 Illness, unspecified: Secondary | ICD-10-CM | POA: Diagnosis not present

## 2018-07-07 DIAGNOSIS — K5190 Ulcerative colitis, unspecified, without complications: Secondary | ICD-10-CM | POA: Diagnosis not present

## 2018-07-07 DIAGNOSIS — I10 Essential (primary) hypertension: Secondary | ICD-10-CM | POA: Diagnosis not present

## 2018-07-14 DIAGNOSIS — N3000 Acute cystitis without hematuria: Secondary | ICD-10-CM | POA: Diagnosis not present

## 2018-07-14 DIAGNOSIS — E669 Obesity, unspecified: Secondary | ICD-10-CM | POA: Diagnosis not present

## 2018-07-14 DIAGNOSIS — R69 Illness, unspecified: Secondary | ICD-10-CM | POA: Diagnosis not present

## 2018-07-14 DIAGNOSIS — Z6835 Body mass index (BMI) 35.0-35.9, adult: Secondary | ICD-10-CM | POA: Diagnosis not present

## 2018-07-14 DIAGNOSIS — K5190 Ulcerative colitis, unspecified, without complications: Secondary | ICD-10-CM | POA: Diagnosis not present

## 2018-07-22 DIAGNOSIS — G4733 Obstructive sleep apnea (adult) (pediatric): Secondary | ICD-10-CM | POA: Diagnosis not present

## 2018-07-28 DIAGNOSIS — E1129 Type 2 diabetes mellitus with other diabetic kidney complication: Secondary | ICD-10-CM | POA: Diagnosis not present

## 2018-08-06 DIAGNOSIS — E559 Vitamin D deficiency, unspecified: Secondary | ICD-10-CM | POA: Diagnosis not present

## 2018-08-06 DIAGNOSIS — I10 Essential (primary) hypertension: Secondary | ICD-10-CM | POA: Diagnosis not present

## 2018-08-17 DIAGNOSIS — R69 Illness, unspecified: Secondary | ICD-10-CM | POA: Diagnosis not present

## 2018-08-30 DIAGNOSIS — E1129 Type 2 diabetes mellitus with other diabetic kidney complication: Secondary | ICD-10-CM | POA: Diagnosis not present

## 2018-09-01 DIAGNOSIS — N183 Chronic kidney disease, stage 3 (moderate): Secondary | ICD-10-CM | POA: Diagnosis not present

## 2018-09-06 ENCOUNTER — Ambulatory Visit: Payer: Medicare HMO | Admitting: Pulmonary Disease

## 2018-09-06 ENCOUNTER — Other Ambulatory Visit: Payer: Self-pay

## 2018-09-06 ENCOUNTER — Encounter: Payer: Self-pay | Admitting: Pulmonary Disease

## 2018-09-06 VITALS — BP 132/64 | HR 58 | Temp 98.6°F | Ht 65.0 in | Wt 273.8 lb

## 2018-09-06 DIAGNOSIS — R0609 Other forms of dyspnea: Secondary | ICD-10-CM

## 2018-09-06 DIAGNOSIS — G4733 Obstructive sleep apnea (adult) (pediatric): Secondary | ICD-10-CM | POA: Diagnosis not present

## 2018-09-06 DIAGNOSIS — R0600 Dyspnea, unspecified: Secondary | ICD-10-CM

## 2018-09-06 DIAGNOSIS — R69 Illness, unspecified: Secondary | ICD-10-CM | POA: Diagnosis not present

## 2018-09-06 DIAGNOSIS — J449 Chronic obstructive pulmonary disease, unspecified: Secondary | ICD-10-CM

## 2018-09-06 DIAGNOSIS — K219 Gastro-esophageal reflux disease without esophagitis: Secondary | ICD-10-CM | POA: Diagnosis not present

## 2018-09-06 DIAGNOSIS — I10 Essential (primary) hypertension: Secondary | ICD-10-CM | POA: Diagnosis not present

## 2018-09-06 DIAGNOSIS — Z9989 Dependence on other enabling machines and devices: Secondary | ICD-10-CM | POA: Diagnosis not present

## 2018-09-06 DIAGNOSIS — E559 Vitamin D deficiency, unspecified: Secondary | ICD-10-CM | POA: Diagnosis not present

## 2018-09-06 DIAGNOSIS — E1129 Type 2 diabetes mellitus with other diabetic kidney complication: Secondary | ICD-10-CM | POA: Diagnosis not present

## 2018-09-06 MED ORDER — PERFOROMIST 20 MCG/2ML IN NEBU: 20.0000 ug | INHALATION_SOLUTION | Freq: Two times a day (BID) | RESPIRATORY_TRACT | 5 refills | Status: DC

## 2018-09-06 NOTE — Patient Instructions (Addendum)
Stop symbicort  Budesonide one vial nebulized in the morning and one vial nebulized in the evening.  Rinse mouth after each use of budesonide.  Perforomist one vial nebulized in the morning and one vial nebulized in the evening.   Albuterol every 6 hours as needed for cough, wheeze, or chest congestion.  Will arrange for new CPAP machine.  Will arrange for pulmonary function test.  Follow up in 2 months.

## 2018-09-06 NOTE — Progress Notes (Signed)
[facility]  Chief Complaint  Patient presents with  . Follow-up    increased SOB with exertion, cpap humidity, she feels may not be working , feels very dry, wanting new cpap machine, DME [facility]    Constitutional:  BP 132/64 (BP Location: Left Arm, Cuff Size: Normal)   Pulse (!) 58   Temp 98.6 F (37 C) (Oral)   Ht 5' 5"  (1.651 m)   Wt 273 lb 12.8 oz (124.2 kg)   SpO2 95%   BMI 45.56 kg/m   Past Medical History:  Ulcerative colitis, Pneumonia, Neuropathy, HTN, HLD, GERD, Diverticulitis, DM, Depression, CKD, Diastolic CHF, OA, Anemia  Brief Summary:  Kathleen Hatfield is a 63 y.o. female with COPD/asthma and obstructive sleep apnea.  She continues to have trouble with her breathing.  She tried [name], but didn't feel like she could generate enough of a breath to get medicine in.  She goes back and forth between symbicort and budesonide.  She feels the nebulizer works better.  She has cough and intermittent wheeze.  Has clear sputum.    She uses CPAP nightly.  No issues with mask fit.  Her machine is more than 64 yrs old.  She doesn't feel that the humidifier works anymore, and her nose and mouth get dry.  CXR from 05/13/18 (reviewed by me) - cardiomegaly.  Physical Exam:   Appearance - well kempt   ENMT - clear nasal mucosa, midline nasal  septum, no oral exudates, no LAN, trachea midline  Respiratory - normal chest wall, normal respiratory effort, no accessory muscle use, no wheeze/rales  CV - s1s2 regular rate and rhythm, no murmurs, no peripheral edema, radial pulses symmetric  GI - soft, non tender, no masses  Lymph - no adenopathy noted in neck and axillary areas  MSK - normal gait  Ext - no cyanosis, clubbing, or joint inflammation noted  Skin - no rashes, lesions, or ulcers  Neuro - normal strength, oriented x 3  Psych - normal mood and affect   Assessment/Plan:   COPD with asthma. - she has progressive symptoms -  will have her continue budesonide by nebulizer and add perforomist; these will take the place of symbicort - continue prn albuterol - will arrange for pulmonary function test - if she continues to have difficulty, then she might  additional chest imaging studies  Obstructive sleep apnea. - she is compliant with CPAP and reports benefit - will arrange for new CPAP machine since her current device is more than 64 yrs old - continue CPAP 13 cm W1U  Chronic diastolic CHF. - followed by cardiology  Obesity with deconditioning. - encouraged her to maintain a regular exercise regimen   Patient Instructions  Stop symbicort  Budesonide one vial nebulized in the morning and one vial nebulized in the evening.  Rinse mouth after each use of budesonide.  Perforomist one vial nebulized in the morning and one vial nebulized in the evening.   Albuterol every 6 hours as needed for cough, wheeze, or chest congestion.  Will arrange for new CPAP machine.  Will arrange for pulmonary function test.  Follow up in 2 months.    [name], MD [facility] Pager: [phone] 09/06/2018, 12:00 PM  Flow Sheet     Pulmonary tests:  PFT 07/02/15 >> FEV1 1.50 (56%), FEV1% 69, TLC 4.99 (96%), DLCO 60% V/Q 04/05/16 >> normal  Sleep tests:  PSG 09/01/12 >> AHI 36.1 ONO with CPAP 03/14/17 >>test  time 10 hrs 34 min. Basal SpO2 93%, low SpO2 88%. CPAP 08/07/18 to 09/05/18 >> used on 29 of 30 nights with average 5 hrs 28 min.  Average AHI 0.1 with CPAP 13 cm H2O  Cardiac tests:  Echo 04/03/16 >> mild LVH, EF 65 to 70%, grade 1 DD  Medications:   Allergies as of 09/06/2018      Reactions   Almond Oil Anaphylaxis   Morphine And Related Shortness Of Breath, Other (See Comments)   Pt. States while in the hospital it affected her breathing, O2 dropped to the 70's   Atorvastatin Other (See Comments)   Leg weakness   Ceclor [cefaclor] Nausea And Vomiting   Ciprofloxacin Other (See  Comments)   Makes joints and muscles ache   Levaquin [levofloxacin] Other (See Comments)   Body aches   Losartan Other (See Comments)   Weakness   Statins    Leg and body weakness   Sulfa Antibiotics Nausea And Vomiting      Medication List       Accurate as of September 06, 2018 12:00 PM. If you have any questions, ask your nurse or doctor.        STOP taking these medications   ipratropium-albuterol 0.5-2.5 (3) MG/3ML Soln Commonly known as: DUONEB Stopped by: [name], MD   Symbicort 160-4.5 MCG/ACT inhaler Generic drug: budesonide-formoterol Stopped by: [name], MD     TAKE these medications   acetaminophen 500 MG tablet Commonly known as: TYLENOL Take 1,000 mg by mouth every 6 (six) hours as needed for pain.   albuterol 108 (90 Base) MCG/ACT inhaler Commonly known as: VENTOLIN HFA Inhale 2 puffs into the lungs every 4 (four) hours as needed for wheezing or shortness of breath. What changed: Another medication with the same name was changed. Make sure you understand how and when to take each.   albuterol 108 (90 Base) MCG/ACT inhaler Commonly known as: ProAir HFA INHALE 2 PUFFS INTO THE LUNGS EVERY FOUR HOURS AS NEEDED FOR WHEEZING. What changed:   how much to take  how to take this  when to take this  reasons to take this  additional instructions   Bayer Contour Next Test test strip Generic drug: glucose blood As directed   budesonide 0.5 MG/2ML nebulizer solution Commonly known as: PULMICORT Take 2 mLs by nebulization daily as needed for shortness of breath or wheezing.   DULoxetine 60 MG capsule Commonly known as: CYMBALTA Take 60 mg by mouth daily.   furosemide 40 MG tablet Commonly known as: LASIX Take 60 mg by mouth daily.   gabapentin 100 MG capsule Commonly known as: NEURONTIN Take 900 mg by mouth at bedtime.   Global Ease Inject Pen Needles 32G X 4 MM Misc Generic drug: Insulin Pen Needle   metolazone 5 MG tablet Commonly  known as: ZAROXOLYN Take 2.5 mg by mouth daily as needed (Take with Lasix when weight is elevated 3 pounds overnight and 5 pounds or more for 2 days).   Microlet Lancets Misc As directed   pantoprazole 40 MG tablet Commonly known as: PROTONIX Take 40 mg by mouth daily.   Perforomist 20 MCG/2ML nebulizer solution Generic drug: formoterol Take 2 mLs (20 mcg total) by nebulization 2 (two) times daily. Started by: [name], MD   polyethylene glycol 17 g packet Commonly known as: MIRALAX / GLYCOLAX Take 17 g by mouth daily as needed for constipation.   potassium chloride 10 MEQ tablet Commonly known as: K-DUR Take  40 mEq by mouth 2 (two) times daily.   rOPINIRole 4 MG tablet Commonly known as: REQUIP Take 4-6 mg by mouth See admin instructions. Take 38m in the morning and 619mat bedtime   traMADol 50 MG tablet Commonly known as: ULTRAM Take 50 mg by mouth daily as needed for pain.   TrTyler AaslexTouch 200 UNIT/ML Sopn Generic drug: Insulin Degludec Inject 76 Units into the skin daily.   UNABLE TO FIND CPAP at bedtime   Victoza 18 MG/3ML Sopn Generic drug: liraglutide Inject 1.8 mg into the skin daily with lunch.   Vitamin D-3 125 MCG (5000 UT) Tabs Take 5,000 Units by mouth daily.       Past Surgical History:  She  has a past surgical history that includes Abdominal hysterectomy; Cholecystectomy; Cesarean section; Appendectomy; Sigmoidectomy (2010); right heart catheterization (N/A, 02/27/2013); right heart catheterization (N/A, 12/14/2013); Cardiac catheterization; Breast surgery; Hernia repair; Ventral hernia repair (N/A, 11/15/2015); Laparoscopic lysis of adhesions (N/A, 11/15/2015); and Insertion of mesh (N/A, 11/15/2015).  Family History:  Her family history includes COPD in her father; Dementia in her mother; Heart disease in her father and mother; Hypertension in her father and mother.  Social History:  She  reports that she has never smoked. She has never used  smokeless tobacco. She reports that she does not drink alcohol or use drugs.

## 2018-09-07 ENCOUNTER — Telehealth: Payer: Self-pay | Admitting: Pulmonary Disease

## 2018-09-07 DIAGNOSIS — M170 Bilateral primary osteoarthritis of knee: Secondary | ICD-10-CM | POA: Diagnosis not present

## 2018-09-07 NOTE — Telephone Encounter (Signed)
Called and spoke with Patient.  Patient stated [facility] brought her a travel nebulizer machine and she refused it, because she wanted a regular table top nebulizer.   Called [facility] and spoke with Protivin.  Estill Bamberg stated she had spoke with Patient and she is going to get a regular table top neb machine. Estill Bamberg stated regular table top neb machines should come in tomorrow.  Called and left detailed message, per Patient request, on answering machine, letting her know she would receive neb machine once [facility] gets them in. Nothing further at this time.                            -------------------------------------------------------------------------

## 2018-09-07 NOTE — Telephone Encounter (Signed)
Pt said if you don;'t reachher by phone call email her @ [email or web address].Kathleen Hatfield

## 2018-09-08 DIAGNOSIS — I129 Hypertensive chronic kidney disease with stage 1 through stage 4 chronic kidney disease, or unspecified chronic kidney disease: Secondary | ICD-10-CM | POA: Diagnosis not present

## 2018-09-08 DIAGNOSIS — E1122 Type 2 diabetes mellitus with diabetic chronic kidney disease: Secondary | ICD-10-CM | POA: Diagnosis not present

## 2018-09-08 DIAGNOSIS — N183 Chronic kidney disease, stage 3 (moderate): Secondary | ICD-10-CM | POA: Diagnosis not present

## 2018-09-08 DIAGNOSIS — Z794 Long term (current) use of insulin: Secondary | ICD-10-CM | POA: Diagnosis not present

## 2018-09-14 DIAGNOSIS — J449 Chronic obstructive pulmonary disease, unspecified: Secondary | ICD-10-CM | POA: Diagnosis not present

## 2018-09-19 DIAGNOSIS — G4733 Obstructive sleep apnea (adult) (pediatric): Secondary | ICD-10-CM | POA: Diagnosis not present

## 2018-10-03 ENCOUNTER — Ambulatory Visit: Payer: Medicare HMO | Admitting: Pulmonary Disease

## 2018-10-07 DIAGNOSIS — E782 Mixed hyperlipidemia: Secondary | ICD-10-CM | POA: Diagnosis not present

## 2018-10-07 DIAGNOSIS — E1129 Type 2 diabetes mellitus with other diabetic kidney complication: Secondary | ICD-10-CM | POA: Diagnosis not present

## 2018-10-07 DIAGNOSIS — K219 Gastro-esophageal reflux disease without esophagitis: Secondary | ICD-10-CM | POA: Diagnosis not present

## 2018-10-15 DIAGNOSIS — J449 Chronic obstructive pulmonary disease, unspecified: Secondary | ICD-10-CM | POA: Diagnosis not present

## 2018-10-20 DIAGNOSIS — G4733 Obstructive sleep apnea (adult) (pediatric): Secondary | ICD-10-CM | POA: Diagnosis not present

## 2018-10-25 DIAGNOSIS — R399 Unspecified symptoms and signs involving the genitourinary system: Secondary | ICD-10-CM | POA: Diagnosis not present

## 2018-10-25 DIAGNOSIS — R197 Diarrhea, unspecified: Secondary | ICD-10-CM | POA: Diagnosis not present

## 2018-10-25 DIAGNOSIS — R109 Unspecified abdominal pain: Secondary | ICD-10-CM | POA: Diagnosis not present

## 2018-10-25 DIAGNOSIS — Z7689 Persons encountering health services in other specified circumstances: Secondary | ICD-10-CM | POA: Diagnosis not present

## 2018-10-28 DIAGNOSIS — N3000 Acute cystitis without hematuria: Secondary | ICD-10-CM | POA: Diagnosis not present

## 2018-10-28 DIAGNOSIS — K5732 Diverticulitis of large intestine without perforation or abscess without bleeding: Secondary | ICD-10-CM | POA: Diagnosis not present

## 2018-10-28 DIAGNOSIS — Z6841 Body Mass Index (BMI) 40.0 and over, adult: Secondary | ICD-10-CM | POA: Diagnosis not present

## 2018-11-04 DIAGNOSIS — G4733 Obstructive sleep apnea (adult) (pediatric): Secondary | ICD-10-CM | POA: Diagnosis not present

## 2018-11-07 DIAGNOSIS — I10 Essential (primary) hypertension: Secondary | ICD-10-CM | POA: Diagnosis not present

## 2018-11-07 DIAGNOSIS — E1129 Type 2 diabetes mellitus with other diabetic kidney complication: Secondary | ICD-10-CM | POA: Diagnosis not present

## 2018-11-07 DIAGNOSIS — E785 Hyperlipidemia, unspecified: Secondary | ICD-10-CM | POA: Diagnosis not present

## 2018-11-09 DIAGNOSIS — Z6841 Body Mass Index (BMI) 40.0 and over, adult: Secondary | ICD-10-CM | POA: Diagnosis not present

## 2018-11-09 DIAGNOSIS — K5732 Diverticulitis of large intestine without perforation or abscess without bleeding: Secondary | ICD-10-CM | POA: Diagnosis not present

## 2018-11-09 DIAGNOSIS — M722 Plantar fascial fibromatosis: Secondary | ICD-10-CM | POA: Diagnosis not present

## 2018-11-15 DIAGNOSIS — J449 Chronic obstructive pulmonary disease, unspecified: Secondary | ICD-10-CM | POA: Diagnosis not present

## 2018-11-20 DIAGNOSIS — G4733 Obstructive sleep apnea (adult) (pediatric): Secondary | ICD-10-CM | POA: Diagnosis not present

## 2018-12-01 ENCOUNTER — Ambulatory Visit
Admission: EM | Admit: 2018-12-01 | Discharge: 2018-12-01 | Disposition: A | Discharge status: Home / Self Care | Payer: Medicare HMO | Attending: Physician Assistant | Admitting: Physician Assistant

## 2018-12-01 DIAGNOSIS — R1032 Left lower quadrant pain: Secondary | ICD-10-CM

## 2018-12-01 DIAGNOSIS — R110 Nausea: Secondary | ICD-10-CM

## 2018-12-01 DIAGNOSIS — R1031 Right lower quadrant pain: Secondary | ICD-10-CM | POA: Diagnosis not present

## 2018-12-01 MED ORDER — AMOXICILLIN-POT CLAVULANATE 875-125 MG PO TABS: 1.0000 | ORAL_TABLET | Freq: Two times a day (BID) | ORAL | 0 refills | Status: DC

## 2018-12-01 MED ORDER — ONDANSETRON 4 MG PO TBDP: 4.0000 mg | ORAL_TABLET | Freq: Three times a day (TID) | ORAL | 0 refills | Status: DC | PRN

## 2018-12-01 NOTE — ED Provider Notes (Signed)
[facility]    CSN: [id number] Arrival date & time: 12/01/18  1601      History   Chief Complaint Chief Complaint  Patient presents with  . Abdominal Pain    HPI Kathleen Hatfield is a 63 y.o. female.   63 year old female comes in for possible diverticulitis flare x3 days.  Patient states has had bilateral lower abdominal pain, nausea, constipation.  She denies any vomiting.  Subjective fever with chills.  Denies urinary symptoms such as frequency, dysuria, hematuria.  Patient states right lower quadrant pain is worse than left lower quadrant pain, which is normal for her during a diverticulitis flare after her sigmoidectomy.  Constipation is also normal for her during her diverticulitis flare.  She is still passing flatus.  Also feels more bloated, and has increased burping.  Her last colonoscopy was 5 years ago, and does follow with GI specialist.      Past Medical History:  Diagnosis Date  . Anemia   . Arthritis   . Asthma   . CHF (congestive heart failure) ([place]) 05/2014  . Chronic diastolic heart failure ([place])   . CKD (chronic kidney disease)   . Depression   . Diabetes mellitus without complication ([place])   . Diverticulitis   . Dyspnea    with exertion  . GERD (gastroesophageal reflux disease)   . History of blood transfusion   . Hyperlipidemia    cannot tolerate statins  . Hypertension   . Peripheral neuropathy   . Pneumonia   . PONV (postoperative nausea and vomiting)   . Sleep apnea    uses CPAP  . Ulcerative colitis [facility])    dr Collene Mares    Patient Active Problem List   Diagnosis Date Noted  . Insulin-requiring or dependent type II diabetes mellitus ([place])   . COPD with acute exacerbation ([place]) 05/03/2017  . Influenza A   . Renal insufficiency   . Acute hypoxemic respiratory failure ([place]) 04/03/2016  . Acute bronchitis 04/02/2016  . Acute renal failure superimposed on stage 2 chronic kidney disease ([place]) 04/02/2016  . Respiratory distress  04/02/2016  . Diabetes mellitus without complication ([place])   . CKD (chronic kidney disease)   . Stage 2 chronic kidney disease   . Solitary pulmonary nodule 11/20/2015  . Urinary retention 11/18/2015  . Hypoxemia 11/18/2015  . Incisional hernia s/p lap repair with mesh 11/15/2015 11/15/2015  . Hypersomnia with sleep apnea 09/16/2015  . Fatigue 06/07/2015  . Morbid obesity ([place]) 06/07/2015  . Cough 04/18/2015  . Folliculitis 74/82/7078  . Chronic diastolic heart failure ([place])   . OSA (obstructive sleep apnea) 08/23/2012  . Hypertension   . Depression   . GERD (gastroesophageal reflux disease)   . Hyperlipidemia   . Mild persistent chronic asthma without complication   . Dyspnea   . Ulcerative colitis ([place])   . Patellar tendinitis 05/25/2011  . Knee pain 05/25/2011  . Myofascial pain 05/25/2011  . Cervicalgia 05/25/2011    Past Surgical History:  Procedure Laterality Date  . ABDOMINAL HYSTERECTOMY    . APPENDECTOMY    . BREAST SURGERY     left biopsy  . CARDIAC CATHETERIZATION    . CESAREAN SECTION    . CHOLECYSTECTOMY    . HERNIA REPAIR    . INSERTION OF MESH N/A 11/15/2015   Procedure: INSERTION OF MESH;  Surgeon: [name], MD;  Location: [facility];  Service: General;  Laterality: N/A;  . LAPAROSCOPIC LYSIS OF ADHESIONS N/A 11/15/2015  Procedure: LAPAROSCOPIC LYSIS OF ADHESIONS;  Surgeon: [name], MD;  Location: [facility];  Service: General;  Laterality: N/A;  . RIGHT HEART CATHETERIZATION N/A 02/27/2013   Procedure: RIGHT HEART CATH;  Surgeon: Blane Ohara, MD;  Location[facility] LAB;  Service: Cardiovascular;  Laterality: N/A;  . RIGHT HEART CATHETERIZATION N/A 12/14/2013   Procedure: RIGHT HEART CATH;  Surgeon: Larey Dresser, MD;  Location: [facility] CATH LAB;  Service: Cardiovascular;  Laterality: N/A;  . SIGMOIDECTOMY  2010   diverticular disease  . VENTRAL HERNIA REPAIR N/A 11/15/2015   Procedure: LAPAROSCOPIC VENTRAL WALL HERNIA REPAIR;  Surgeon: [name], MD;   Location: [facility];  Service: General;  Laterality: N/A;    OB History   No obstetric history on file.      Home Medications    Prior to Admission medications   Medication Sig Start Date End Date Taking? Authorizing Provider  acetaminophen (TYLENOL) 500 MG tablet Take 1,000 mg by mouth every 6 (six) hours as needed for pain.    [provider]  albuterol (PROAIR HFA) 108 (90 Base) MCG/ACT inhaler INHALE 2 PUFFS INTO THE LUNGS EVERY FOUR HOURS AS NEEDED FOR WHEEZING. Patient taking differently: Inhale 2 puffs into the lungs every 4 (four) hours as needed for wheezing.  05/21/17   Parrett, Fonnie Mu, NP  albuterol (PROVENTIL HFA;VENTOLIN HFA) 108 (90 Base) MCG/ACT inhaler Inhale 2 puffs into the lungs every 4 (four) hours as needed for wheezing or shortness of breath.    [provider]  amoxicillin-clavulanate (AUGMENTIN) 875-125 MG tablet Take 1 tablet by mouth every 12 (twelve) hours. 12/01/18   Ok Edwards, PA-C  BAYER CONTOUR NEXT TEST test strip As directed 07/30/15   [provider]  budesonide (PULMICORT) 0.5 MG/2ML nebulizer solution Take 2 mLs by nebulization daily as needed for shortness of breath or wheezing. 11/12/17   [provider]  Cholecalciferol (VITAMIN D-3) 5000 UNITS TABS Take 5,000 Units by mouth daily.     [provider]  DULoxetine (CYMBALTA) 60 MG capsule Take 60 mg by mouth daily.     [provider]  formoterol (PERFOROMIST) 20 MCG/2ML nebulizer solution Take 2 mLs (20 mcg total) by nebulization 2 (two) times daily. 09/06/18   [name], MD  furosemide (LASIX) 40 MG tablet Take 60 mg by mouth daily.     [provider]  gabapentin (NEURONTIN) 100 MG capsule Take 900 mg by mouth at bedtime.     [provider]  GLOBAL EASE INJECT PEN NEEDLES 32G X 4 MM MISC  06/19/15   [provider]  Insulin Degludec (TRESIBA FLEXTOUCH) 200 UNIT/ML SOPN Inject 76 Units into the skin daily.     [provider]  Liraglutide (VICTOZA) 18 MG/3ML SOPN Inject 1.8 mg into the skin daily with lunch.     [provider]  metolazone (ZAROXOLYN) 5 MG tablet Take 2.5 mg by mouth daily as needed (Take with Lasix when weight is elevated 3 pounds overnight and 5 pounds or more for 2 days).     [provider]  MICROLET LANCETS [place] As directed 07/30/15   [provider]  ondansetron (ZOFRAN ODT) 4 MG disintegrating tablet Take 1 tablet (4 mg total) by mouth every 8 (eight) hours as needed for nausea or vomiting. 12/01/18   Tasia Catchings, Benen Weida V, PA-C  pantoprazole (PROTONIX) 40 MG tablet Take 40 mg by mouth daily.  04/14/12   [provider]  polyethylene glycol (MIRALAX /  GLYCOLAX) packet Take 17 g by mouth daily as needed for constipation.    [provider]  potassium chloride (K-DUR,KLOR-CON) 10 MEQ tablet Take 40 mEq by mouth 2 (two) times daily.     [provider]  rOPINIRole (REQUIP) 4 MG tablet Take 4-6 mg by mouth See admin instructions. Take 37m in the morning and 669mat bedtime    [provider]  traMADol (ULTRAM) 50 MG tablet Take 50 mg by mouth daily as needed for pain.    [provider]  UNABLE TO FIND CPAP at bedtime    [provider]    Family History Family History  Problem Relation Age of Onset  . Heart disease Mother   . Hypertension Mother   . Dementia Mother   . Heart disease Father   . COPD Father   . Hypertension Father     Social History Social History   Tobacco Use  . Smoking status: Never Smoker  . Smokeless tobacco: Never Used  Substance Use Topics  . Alcohol use: No  . Drug use: No     Allergies   Almond oil, Morphine and related, Atorvastatin, Ceclor [cefaclor], Ciprofloxacin, Levaquin [levofloxacin], Losartan, Statins, and Sulfa antibiotics   Review of Systems Review of Systems  Reason unable to perform ROS: See HPI as above.     Physical Exam Triage Vital Signs ED Triage  Vitals  Enc Vitals Group     BP 12/01/18 1625 133/79     Pulse Rate 12/01/18 1625 (!) 103     Resp 12/01/18 1625 20     Temp 12/01/18 1625 98.2 F (36.8 C)     Temp Source 12/01/18 1625 Oral     SpO2 12/01/18 1625 92 %     Weight --      Height --      Head Circumference --      Peak Flow --      Pain Score 12/01/18 1626 9     Pain Loc --      Pain Edu? --      Excl. in [facility]--    No data found.  Updated Vital Signs BP 133/79 (BP Location: Left Arm)   Pulse (!) 103   Temp 98.2 F (36.8 C) (Oral)   Resp 20   SpO2 92%   Physical Exam Constitutional:      General: She is not in acute distress.    Appearance: She is well-developed. She is not ill-appearing, toxic-appearing or diaphoretic.  HENT:     Head: Normocephalic and atraumatic.  Eyes:     Conjunctiva/sclera: Conjunctivae normal.     Pupils: Pupils are equal, round, and reactive to light.  Cardiovascular:     Rate and Rhythm: Normal rate and regular rhythm.     Heart sounds: Normal heart sounds. No murmur. No friction rub. No gallop.   Pulmonary:     Effort: Pulmonary effort is normal.     Breath sounds: Normal breath sounds. No wheezing or rales.  Abdominal:     General: A surgical scar is present. Bowel sounds are normal.     Palpations: Abdomen is soft.     Tenderness: There is abdominal tenderness in the right lower quadrant and left lower quadrant. There is no right CVA tenderness, left CVA tenderness, guarding or rebound. Negative signs include Rovsing's sign, McBurney's sign, psoas sign and obturator sign.  Skin:    General: Skin is warm and dry.  Neurological:  Mental Status: She is alert and oriented to person, place, and time.  Psychiatric:        Behavior: Behavior normal.        Judgment: Judgment normal.      UC Treatments / Results  Labs (all labs ordered are listed, but only abnormal results are displayed) Labs Reviewed - No data to display  EKG   Radiology No results found.   Procedures Procedures (including critical care time)  Medications Ordered in UC Medications - No data to display  Initial Impression / Assessment and Plan / UC Course  I have reviewed the triage vital signs and the nursing notes.  Pertinent labs & imaging results that were available during my care of the patient were reviewed by me and considered in my medical decision making (see chart for details).    Positive bowel sounds, abdomen soft, bilateral lower abdominal tenderness without guarding or rebound.  Patient with history of diverticulitis flareup, currently with similar symptoms.  States she usually gets penicillin type medication for flareup, will start Augmentin as directed.  Will provide Zofran for nausea/vomiting.  Patient to contact GI provider for follow-up and further management needed.  Strict return precautions given.  Patient expresses understanding and agrees to plan.  Final Clinical Impressions(s) / UC Diagnoses   Final diagnoses:  Bilateral lower abdominal pain  Nausea without vomiting   ED Prescriptions    Medication Sig Dispense Auth. Provider   amoxicillin-clavulanate (AUGMENTIN) 875-125 MG tablet Take 1 tablet by mouth every 12 (twelve) hours. 14 tablet Musette Kisamore V, PA-C   ondansetron (ZOFRAN ODT) 4 MG disintegrating tablet Take 1 tablet (4 mg total) by mouth every 8 (eight) hours as needed for nausea or vomiting. 20 tablet Ok Edwards, PA-C     PDMP not reviewed this encounter.   Ok Edwards, PA-C 12/01/18 1734

## 2018-12-01 NOTE — Discharge Instructions (Signed)
No alarming signs on exam.  Start Augmentin as directed for possible diverticulitis.  Zofran as needed for nausea/vomiting.  Bland diet, increase as tolerated. Keep hydrated, urine should be clear to pale yellow in color.  Please call your GI doctor and make her aware of current symptoms.  If experiencing worsening abdominal pain, nausea/vomiting not controlled by medication, still not moving bowels, not passing gas, go to the emergency department for further evaluation needed.

## 2018-12-01 NOTE — ED Triage Notes (Signed)
Pt c/o diverticulitis flare up x3 days, pain, nausea, and constipation. States taking tramadol and bentyl with no relief.

## 2018-12-02 ENCOUNTER — Telehealth: Payer: Self-pay | Admitting: Emergency Medicine

## 2018-12-02 NOTE — Telephone Encounter (Signed)
Checked in on patient, discussed medications, and encouraged return call with any continuing questions or concerns.

## 2018-12-12 DIAGNOSIS — R062 Wheezing: Secondary | ICD-10-CM | POA: Diagnosis not present

## 2018-12-12 DIAGNOSIS — Z6841 Body Mass Index (BMI) 40.0 and over, adult: Secondary | ICD-10-CM | POA: Diagnosis not present

## 2018-12-12 DIAGNOSIS — J189 Pneumonia, unspecified organism: Secondary | ICD-10-CM | POA: Diagnosis not present

## 2018-12-12 DIAGNOSIS — K5732 Diverticulitis of large intestine without perforation or abscess without bleeding: Secondary | ICD-10-CM | POA: Diagnosis not present

## 2018-12-15 DIAGNOSIS — J449 Chronic obstructive pulmonary disease, unspecified: Secondary | ICD-10-CM | POA: Diagnosis not present

## 2018-12-20 DIAGNOSIS — G4733 Obstructive sleep apnea (adult) (pediatric): Secondary | ICD-10-CM | POA: Diagnosis not present

## 2018-12-30 DIAGNOSIS — E1129 Type 2 diabetes mellitus with other diabetic kidney complication: Secondary | ICD-10-CM | POA: Diagnosis not present

## 2018-12-30 DIAGNOSIS — G4733 Obstructive sleep apnea (adult) (pediatric): Secondary | ICD-10-CM | POA: Diagnosis not present

## 2019-01-06 DIAGNOSIS — E782 Mixed hyperlipidemia: Secondary | ICD-10-CM | POA: Diagnosis not present

## 2019-01-06 DIAGNOSIS — I10 Essential (primary) hypertension: Secondary | ICD-10-CM | POA: Diagnosis not present

## 2019-01-15 DIAGNOSIS — J449 Chronic obstructive pulmonary disease, unspecified: Secondary | ICD-10-CM | POA: Diagnosis not present

## 2019-01-20 DIAGNOSIS — G4733 Obstructive sleep apnea (adult) (pediatric): Secondary | ICD-10-CM | POA: Diagnosis not present

## 2019-01-24 DIAGNOSIS — R05 Cough: Secondary | ICD-10-CM | POA: Diagnosis not present

## 2019-01-24 DIAGNOSIS — Z20828 Contact with and (suspected) exposure to other viral communicable diseases: Secondary | ICD-10-CM | POA: Diagnosis not present

## 2019-02-01 ENCOUNTER — Other Ambulatory Visit: Payer: Self-pay

## 2019-02-01 ENCOUNTER — Emergency Department (HOSPITAL_COMMUNITY): Payer: Medicare HMO

## 2019-02-01 ENCOUNTER — Emergency Department (HOSPITAL_COMMUNITY)
Admission: EM | Admit: 2019-02-01 | Discharge: 2019-02-01 | Disposition: A | Discharge status: Home / Self Care | Payer: Medicare HMO | Attending: Emergency Medicine | Admitting: Emergency Medicine

## 2019-02-01 DIAGNOSIS — R000 Tachycardia, unspecified: Secondary | ICD-10-CM | POA: Diagnosis not present

## 2019-02-01 DIAGNOSIS — R05 Cough: Secondary | ICD-10-CM | POA: Insufficient documentation

## 2019-02-01 DIAGNOSIS — I5032 Chronic diastolic (congestive) heart failure: Secondary | ICD-10-CM | POA: Diagnosis not present

## 2019-02-01 DIAGNOSIS — R0981 Nasal congestion: Secondary | ICD-10-CM | POA: Insufficient documentation

## 2019-02-01 DIAGNOSIS — R110 Nausea: Secondary | ICD-10-CM | POA: Diagnosis not present

## 2019-02-01 DIAGNOSIS — N182 Chronic kidney disease, stage 2 (mild): Secondary | ICD-10-CM | POA: Insufficient documentation

## 2019-02-01 DIAGNOSIS — Z20828 Contact with and (suspected) exposure to other viral communicable diseases: Secondary | ICD-10-CM | POA: Insufficient documentation

## 2019-02-01 DIAGNOSIS — I4510 Unspecified right bundle-branch block: Secondary | ICD-10-CM | POA: Diagnosis not present

## 2019-02-01 DIAGNOSIS — R079 Chest pain, unspecified: Secondary | ICD-10-CM | POA: Diagnosis not present

## 2019-02-01 DIAGNOSIS — R0789 Other chest pain: Secondary | ICD-10-CM | POA: Insufficient documentation

## 2019-02-01 DIAGNOSIS — Z79899 Other long term (current) drug therapy: Secondary | ICD-10-CM | POA: Insufficient documentation

## 2019-02-01 DIAGNOSIS — I130 Hypertensive heart and chronic kidney disease with heart failure and stage 1 through stage 4 chronic kidney disease, or unspecified chronic kidney disease: Secondary | ICD-10-CM | POA: Diagnosis not present

## 2019-02-01 DIAGNOSIS — R0602 Shortness of breath: Secondary | ICD-10-CM | POA: Diagnosis not present

## 2019-02-01 DIAGNOSIS — G4733 Obstructive sleep apnea (adult) (pediatric): Secondary | ICD-10-CM | POA: Diagnosis not present

## 2019-02-01 DIAGNOSIS — E876 Hypokalemia: Secondary | ICD-10-CM | POA: Insufficient documentation

## 2019-02-01 DIAGNOSIS — E1122 Type 2 diabetes mellitus with diabetic chronic kidney disease: Secondary | ICD-10-CM | POA: Insufficient documentation

## 2019-02-01 LAB — BASIC METABOLIC PANEL
Anion gap: 18 — ABNORMAL HIGH (ref 5–15)
BUN: 19 mg/dL (ref 8–23)
CO2: 34 mmol/L — ABNORMAL HIGH (ref 22–32)
Calcium: 10.3 mg/dL (ref 8.9–10.3)
Chloride: 86 mmol/L — ABNORMAL LOW (ref 98–111)
Creatinine, Ser: 0.99 mg/dL (ref 0.44–1.00)
GFR calc Af Amer: >60 mL/min (ref >60)
GFR calc non Af Amer: >60 mL/min (ref >60)
Glucose, Bld: 162 mg/dL — ABNORMAL HIGH (ref 70–99)
Potassium: 2.9 mmol/L — ABNORMAL LOW (ref 3.5–5.1)
Sodium: 138 mmol/L (ref 135–145)

## 2019-02-01 LAB — URINALYSIS, ROUTINE W REFLEX MICROSCOPIC
Bilirubin Urine: NEGATIVE
Glucose, UA: NEGATIVE mg/dL
Hgb urine dipstick: NEGATIVE
Ketones, ur: NEGATIVE mg/dL
Leukocytes,Ua: NEGATIVE
Nitrite: NEGATIVE
Protein, ur: NEGATIVE mg/dL
Specific Gravity, Urine: 1.009 (ref 1.005–1.030)
pH: 6 (ref 5.0–8.0)

## 2019-02-01 LAB — CBC WITH DIFFERENTIAL/PLATELET
Abs Immature Granulocytes: 0.12 10*3/uL — ABNORMAL HIGH (ref 0.00–0.07)
Basophils Absolute: 0.1 10*3/uL (ref 0.0–0.1)
Basophils Relative: 0 %
Eosinophils Absolute: 0.1 10*3/uL (ref 0.0–0.5)
Eosinophils Relative: 1 %
HCT: 43.6 % (ref 36.0–46.0)
Hemoglobin: 14.3 g/dL (ref 12.0–15.0)
Immature Granulocytes: 1 %
Lymphocytes Relative: 7 %
Lymphs Abs: 1.5 10*3/uL (ref 0.7–4.0)
MCH: 28.7 pg (ref 26.0–34.0)
MCHC: 32.8 g/dL (ref 30.0–36.0)
MCV: 87.4 fL (ref 80.0–100.0)
Monocytes Absolute: 1.2 10*3/uL — ABNORMAL HIGH (ref 0.1–1.0)
Monocytes Relative: 6 %
Neutro Abs: 17.1 10*3/uL — ABNORMAL HIGH (ref 1.7–7.7)
Neutrophils Relative %: 85 %
Platelets: 347 10*3/uL (ref 150–400)
RBC: 4.99 MIL/uL (ref 3.87–5.11)
RDW: 16 % — ABNORMAL HIGH (ref 11.5–15.5)
WBC: 20 10*3/uL — ABNORMAL HIGH (ref 4.0–10.5)
nRBC: 0 % (ref 0.0–0.2)

## 2019-02-01 LAB — POC SARS CORONAVIRUS 2 AG -  ED: SARS Coronavirus 2 Ag: NEGATIVE

## 2019-02-01 LAB — BRAIN NATRIURETIC PEPTIDE: B Natriuretic Peptide: 37.4 pg/mL (ref 0.0–100.0)

## 2019-02-01 LAB — CBG MONITORING, ED: Glucose-Capillary: 165 mg/dL — ABNORMAL HIGH (ref 70–99)

## 2019-02-01 LAB — TROPONIN I (HIGH SENSITIVITY)
Troponin I (High Sensitivity): 15 ng/L (ref <18)
Troponin I (High Sensitivity): 14 ng/L (ref <18)

## 2019-02-01 MED ORDER — POTASSIUM CHLORIDE CRYS ER 20 MEQ PO TBCR
40.0000 meq | EXTENDED_RELEASE_TABLET | Freq: Once | ORAL | Status: AC
  Administered 2019-02-01: 40 meq via ORAL
  Filled 2019-02-01: qty 2

## 2019-02-01 MED ORDER — ASPIRIN 81 MG PO CHEW: 324.0000 mg | CHEWABLE_TABLET | Freq: Once | ORAL | Status: DC

## 2019-02-01 MED ORDER — POTASSIUM CHLORIDE 10 MEQ/100ML IV SOLN
10.0000 meq | Freq: Once | INTRAVENOUS | Status: AC
  Administered 2019-02-01: 10 meq via INTRAVENOUS
  Filled 2019-02-01: qty 100

## 2019-02-01 NOTE — ED Provider Notes (Signed)
[facility] Provider Note   CSN: [id number] Arrival date & time: 02/01/19  1659     History   Chief Complaint Chief Complaint  Patient presents with   Shortness of Breath    HPI Kathleen Hatfield is a 63 y.o. female with pertinent PMH of dCHF EF EF 65-70%, OSA on CPAP, COPD, obesity, DM on insulin, CKD, presents to the ER for evaluation of chest pain.  Described as "numb".  Non pleuritic.  It was across Patient has a hard time describing it, states it is not painful or pressure but feels like when you stretch your muscles and muscles go "numb".  No tingling or weakness in extremities.  This happened around 11 am today as she was walking into her kitchen. It lasted a few seconds.  She had associated "jitteriness" and shaking that started in her head and went down to her hands, bilaterally.  Jitteriness was so bad she had a hard time holding on to things and typing on the computer.  Also had mild shortness of breath as she was walking into the kitchen but states she usually gets short of breath when walking across her house with chest tightness.  She laid down and symptoms slightly improved.  She sat up on bed and chest discomfort returns for a few fleeting seconds prompting her to come to ER.  States she has had jitteriness and shakiness for close to one year.  Has discussed this with per PCP but unknown etiology.  No jitteriness with breathing treatments. Her temperature lately has been 2 degrees higher than normal, usually 97.9 now 99s.  Has a chronic cough that has progressively worsened in the last 6 weeks with PND, congestion. She went to PCP who thought it might be bronchitis or pneumonia, was given antibiotics for a week and cough improved but has since returned.  Called PCP h came back and went back to PCP and was prescribed cough medicine which has been helping. Uses inhaler and nebulizer treatments which also help her cough and breathing.  No associated  sputum production.  Some nausea.  Chronic constipation unchanged. Chronic urinary frequency unchanged attributes to lasix.   No sore throat, vomiting, diarrhea, hematuria, dysuria.  No sick contacts. Never used tobacco.  No h/o DVT/PE. No recent surgery, prolonged travel, LE edema, calf pain, hormone therapy.   HPI: A 63 year old patient with a history of treated diabetes, hypertension and obesity presents for evaluation of chest pain. Initial onset of pain was approximately 3-6 hours ago. The patient's chest pain is worse with exertion. The patient's chest pain is not middle- or left-sided, is not well-localized, is not described as heaviness/pressure/tightness, is not sharp and does not radiate to the arms/jaw/neck. The patient does not complain of nausea and denies diaphoresis. The patient has no history of stroke, has no history of peripheral artery disease, has not smoked in the past 90 days, has no relevant family history of coronary artery disease (first degree relative at less than age 54) and has no history of hypercholesterolemia.   HPI  Past Medical History:  Diagnosis Date   Anemia    Arthritis    Asthma    CHF (congestive heart failure) ([place]) 05/2014   Chronic diastolic heart failure (HCC)    CKD (chronic kidney disease)    Depression    Diabetes mellitus without complication (HCC)    Diverticulitis    Dyspnea    with exertion   GERD (gastroesophageal reflux disease)  History of blood transfusion    Hyperlipidemia    cannot tolerate statins   Hypertension    Peripheral neuropathy    Pneumonia    PONV (postoperative nausea and vomiting)    Sleep apnea    uses CPAP   Ulcerative colitis [facility])    dr Collene Mares    Patient Active Problem List   Diagnosis Date Noted   Insulin-requiring or dependent type II diabetes mellitus ([place])    COPD with acute exacerbation ([place]) 05/03/2017   Influenza A    Renal insufficiency    Acute hypoxemic respiratory  failure ([place]) 04/03/2016   Acute bronchitis 04/02/2016   Acute renal failure superimposed on stage 2 chronic kidney disease ([place]) 04/02/2016   Respiratory distress 04/02/2016   Diabetes mellitus without complication (HCC)    CKD (chronic kidney disease)    Stage 2 chronic kidney disease    Solitary pulmonary nodule 11/20/2015   Urinary retention 11/18/2015   Hypoxemia 11/18/2015   Incisional hernia s/p lap repair with mesh 11/15/2015 11/15/2015   Hypersomnia with sleep apnea 09/16/2015   Fatigue 06/07/2015   Morbid obesity ([place]) 06/07/2015   Cough 38/12/1749   Folliculitis 02/58/5277   Chronic diastolic heart failure (HCC)    OSA (obstructive sleep apnea) 08/23/2012   Hypertension    Depression    GERD (gastroesophageal reflux disease)    Hyperlipidemia    Mild persistent chronic asthma without complication    Dyspnea    Ulcerative colitis ([place])    Patellar tendinitis 05/25/2011   Knee pain 05/25/2011   Myofascial pain 05/25/2011   Cervicalgia 05/25/2011    Past Surgical History:  Procedure Laterality Date   ABDOMINAL HYSTERECTOMY     APPENDECTOMY     BREAST SURGERY     left biopsy   CARDIAC CATHETERIZATION     CESAREAN SECTION     CHOLECYSTECTOMY     HERNIA REPAIR     INSERTION OF MESH N/A 11/15/2015   Procedure: INSERTION OF MESH;  Surgeon: [name], MD;  Location: [facility] ORS;  Service: General;  Laterality: N/A;   LAPAROSCOPIC LYSIS OF ADHESIONS N/A 11/15/2015   Procedure: LAPAROSCOPIC LYSIS OF ADHESIONS;  Surgeon: [name], MD;  Location: [facility] ORS;  Service: General;  Laterality: N/A;   RIGHT HEART CATHETERIZATION N/A 02/27/2013   Procedure: RIGHT HEART CATH;  Surgeon: Blane Ohara, MD;  Location: [facility] CATH LAB;  Service: Cardiovascular;  Laterality: N/A;   RIGHT HEART CATHETERIZATION N/A 12/14/2013   Procedure: RIGHT HEART CATH;  Surgeon: Larey Dresser, MD;  Location: [facility] CATH LAB;  Service: Cardiovascular;  Laterality: N/A;     SIGMOIDECTOMY  2010   diverticular disease   VENTRAL HERNIA REPAIR N/A 11/15/2015   Procedure: LAPAROSCOPIC VENTRAL WALL HERNIA REPAIR;  Surgeon: [name], MD;  Location: [facility] ORS;  Service: General;  Laterality: N/A;     OB History   No obstetric history on file.      Home Medications    Prior to Admission medications   Medication Sig Start Date End Date Taking? Authorizing Provider  acetaminophen (TYLENOL) 500 MG tablet Take 1,000 mg by mouth every 6 (six) hours as needed for pain.   Yes [provider]  albuterol (PROAIR HFA) 108 (90 Base) MCG/ACT inhaler INHALE 2 PUFFS INTO THE LUNGS EVERY FOUR HOURS AS NEEDED FOR WHEEZING. Patient taking differently: Inhale 2 puffs into the lungs every 4 (four) hours as needed for wheezing.  05/21/17  Yes Parrett, Fonnie Mu, NP  albuterol (PROVENTIL  HFA;VENTOLIN HFA) 108 (90 Base) MCG/ACT inhaler Inhale 2 puffs into the lungs every 4 (four) hours as needed for wheezing or shortness of breath.   Yes [provider]  BAYER CONTOUR NEXT TEST test strip 1 each by Other route daily. As directed 07/30/15  Yes [provider]  budesonide (PULMICORT) 0.5 MG/2ML nebulizer solution Take 2 mLs by nebulization daily as needed for shortness of breath or wheezing. 11/12/17  Yes [provider]  buPROPion (WELLBUTRIN XL) 150 MG 24 hr tablet Take 150 mg by mouth daily. 01/13/19  Yes [provider]  Cholecalciferol (VITAMIN D-3) 5000 UNITS TABS Take 5,000 Units by mouth daily.    Yes [provider]  DULoxetine (CYMBALTA) 60 MG capsule Take 60 mg by mouth daily.    Yes [provider]  formoterol (PERFOROMIST) 20 MCG/2ML nebulizer solution Take 2 mLs (20 mcg total) by nebulization 2 (two) times daily. 09/06/18  Yes [name], MD  furosemide (LASIX) 40 MG tablet Take 60 mg by mouth daily.    Yes [provider]  gabapentin (NEURONTIN) 100 MG capsule Take 300 mg by mouth at bedtime.    Yes  [provider]  GLOBAL EASE INJECT PEN NEEDLES 32G X 4 MM MISC 1 each by Other route daily.  06/19/15  Yes [provider]  Insulin Degludec (TRESIBA FLEXTOUCH) 200 UNIT/ML SOPN Inject 84 Units into the skin daily.    Yes [provider]  Liraglutide (VICTOZA) 18 MG/3ML SOPN Inject 1.8 mg into the skin daily with lunch.    Yes [provider]  metolazone (ZAROXOLYN) 5 MG tablet Take 2.5 mg by mouth daily as needed (Take with Lasix when weight is elevated 3 pounds overnight and 5 pounds or more for 2 days).    Yes [provider]  MICROLET LANCETS MISC 1 each by Other route. As directed 07/30/15  Yes [provider]  pantoprazole (PROTONIX) 40 MG tablet Take 40 mg by mouth daily.  04/14/12  Yes [provider]  polyethylene glycol (MIRALAX / GLYCOLAX) packet Take 17 g by mouth daily as needed for constipation.   Yes [provider]  potassium chloride (K-DUR,KLOR-CON) 10 MEQ tablet Take 40 mEq by mouth 2 (two) times daily.    Yes [provider]  rOPINIRole (REQUIP) 4 MG tablet Take 4-6 mg by mouth See admin instructions. Take 89m in the morning and 670mat bedtime   Yes [provider]  traMADol (ULTRAM) 50 MG tablet Take 50 mg by mouth daily as needed for pain.   Yes [provider]  UNABLE TO FIND CPAP at bedtime   Yes [provider]  amoxicillin-clavulanate (AUGMENTIN) 875-125 MG tablet Take 1 tablet by mouth every 12 (twelve) hours. Patient not taking: Reported on 02/01/2019 12/01/18   YuOk EdwardsPA-C  ondansetron (ZOFRAN ODT) 4 MG disintegrating tablet Take 1 tablet (4 mg total) by mouth every 8 (eight) hours as needed for nausea or vomiting. Patient not taking: Reported on 02/01/2019 12/01/18   YuArturo Morton  Family History Family History  Problem Relation Age of Onset   Heart disease Mother    Hypertension Mother    Dementia Mother    Heart disease Father    COPD Father     Hypertension Father     Social History Social History   Tobacco Use   Smoking status: Never Smoker   Smokeless tobacco: Never Used  Substance Use Topics   Alcohol  use: No   Drug use: No     Allergies   Almond oil, Morphine and related, Atorvastatin, Ceclor [cefaclor], Ciprofloxacin, Levaquin [levofloxacin], Losartan, Statins, and Sulfa antibiotics   Review of Systems Review of Systems  HENT: Positive for congestion and postnasal drip.   Respiratory: Positive for cough (chronic, worsening 6 weeks) and shortness of breath (chronic).   Cardiovascular: Positive for chest pain ("num").  Gastrointestinal: Positive for nausea.  Genitourinary: Positive for frequency (chronic unchanged on lasix).  Neurological: Positive for tremors (jitteriness).  All other systems reviewed and are negative.    Physical Exam Updated Vital Signs BP (!) 142/71 (BP Location: Right Arm)    Pulse (!) 104    Temp 99 F (37.2 C) (Oral)    Resp 19    Ht 5' 5"  (1.651 m)    Wt 117 kg    SpO2 92%    BMI 42.93 kg/m   Physical Exam Vitals signs and nursing note reviewed.  Constitutional:      General: She is not in acute distress.    Appearance: She is well-developed.     Comments: Obese, NAD.   HENT:     Head: Normocephalic and atraumatic.     Right Ear: External ear normal.     Left Ear: External ear normal.     Nose: Nose normal.     Mouth/Throat:     Mouth: Mucous membranes are moist.  Eyes:     General: No scleral icterus.    Conjunctiva/sclera: Conjunctivae normal.  Neck:     Musculoskeletal: Normal range of motion and neck supple.  Cardiovascular:     Rate and Rhythm: Regular rhythm. Tachycardia present.     Heart sounds: Normal heart sounds. No murmur.     Comments: HR low 100s. Regular. 1+ radial and DP pulses bilaterally. No LE edema. No calf tenderness.  Pulmonary:     Effort: Pulmonary effort is normal.     Breath sounds: Decreased breath sounds present.     Comments: Spo2  90-94% on RA at rest.  Speaking in full sentences, no significant increased work of breathing. Diminished air entry throughout all lung fields. No wheezing, crackles.   Musculoskeletal: Normal range of motion.        General: No deformity.  Skin:    General: Skin is warm and dry.     Capillary Refill: Capillary refill takes less than 2 seconds.  Neurological:     Mental Status: She is alert and oriented to person, place, and time.  Psychiatric:        Behavior: Behavior normal.        Thought Content: Thought content normal.        Judgment: Judgment normal.      ED Treatments / Results  Labs (all labs ordered are listed, but only abnormal results are displayed) Labs Reviewed  BASIC METABOLIC PANEL - Abnormal; Notable for the following components:      Result Value   Potassium 2.9 (*)    Chloride 86 (*)    CO2 34 (*)    Glucose, Bld 162 (*)    Anion gap 18 (*)    All other components within normal limits  CBC WITH DIFFERENTIAL/PLATELET - Abnormal; Notable for the following components:   WBC 20.0 (*)    RDW 16.0 (*)    Neutro Abs 17.1 (*)    Monocytes Absolute 1.2 (*)    Abs Immature Granulocytes 0.12 (*)    All other components within  normal limits  CBG MONITORING, ED - Abnormal; Notable for the following components:   Glucose-Capillary 165 (*)    All other components within normal limits  URINE CULTURE  SARS CORONAVIRUS 2 (TAT 6-24 HRS)  BRAIN NATRIURETIC PEPTIDE  URINALYSIS, ROUTINE W REFLEX MICROSCOPIC  POC SARS CORONAVIRUS 2 AG -  ED  TROPONIN I (HIGH SENSITIVITY)  TROPONIN I (HIGH SENSITIVITY)    EKG EKG Interpretation  Date/Time:  Wednesday February 01 2019 17:10:22 EST Ventricular Rate:  103 PR Interval:    QRS Duration: 158 QT Interval:  399 QTC Calculation: 523 R Axis:   -102 Text Interpretation: Sinus tachycardia RBBB and LAFB No STEMI Confirmed by Kathleen Hatfield [phone]) on 02/01/2019 5:57:11 PM   Radiology Dg Chest Portable 1 View  Result Date:  02/01/2019 CLINICAL DATA:  Shortness of breath.  COPD.  Nonproductive cough. EXAM: PORTABLE CHEST 1 VIEW COMPARISON:  05/13/2018 FINDINGS: The heart size and mediastinal contours are within normal limits. Both lungs are clear. The visualized skeletal structures are unremarkable. IMPRESSION: No active disease. Electronically Signed   By: Lorriane Shire M.D.   On: 02/01/2019 19:25    Procedures Procedures (including critical care time)  Medications Ordered in ED Medications  aspirin chewable tablet 324 mg (324 mg Oral Not Given 02/01/19 2024)  potassium chloride 10 mEq in 100 mL IVPB (10 mEq Intravenous New Bag/Given 02/01/19 2131)  potassium chloride SA (KLOR-CON) CR tablet 40 mEq (40 mEq Oral Given 02/01/19 2128)     Initial Impression / Assessment and Plan / ED Course  I have reviewed the triage vital signs and the nursing notes.  Pertinent labs & imaging results that were available during my care of the patient were reviewed by me and considered in my medical decision making (see chart for details).  Clinical Course as of Feb 01 2207  Wed Feb 01, 2019  1759 Sinus tachycardia RBBB and LAFB No STEMI Confirmed by Kathleen Hatfield [phone]) on 02/01/2019 5:57:11 PM  EKG 12-Lead [CG]  1853 Pulse Rate(!): 104 [CG]  1853 Temp: 99 F (37.2 C) [CG]  1853 WBC(!): 20.0 [CG]  1949 Potassium(!): 2.9 [CG]  1949 Glucose(!): 162 [CG]  1949 Anion gap(!): 18 [CG]  1949 Troponin I (High Sensitivity): 15 [CG]  2150 Troponin I (High Sensitivity): 14 [CG]    Clinical Course User Index [CG] Kinnie Feil, PA-C   Patient's EMR reviewed for additional PMH and pertinent work up.    Last seen by pulmonology Dr Halford Chessman 08/2018 who documented progressive COPD symptoms and shortness of breath.  Echo 2018 with grade 1 DD EF 65-70%.  Last myoview stress test 2014 normal.   Spo2 90+ on RA at rest.  Does not use oxygen at home.  No significant increased work of breathing or fatigue.  No wheezing or crackles  on exam. No signs of hypervolemia, calf tenderness.   CP sounds somewhat atypical, occurred during exertion once but then at rest for a few seconds.  Non pleuritic.  No risk factors for PE.  Compliant with lasix.  Ddx includes viral URI/LRI including COVID, influenza vs COPD exacerbation although no wheezing on exam.  CHF decompensation considered less likely given euvolemia on exam, last echo EF.  PE considered less likely as she has cough, no pleuritic CP.  Suspect some degree of obesity/deconditioning also contributing.   Labs including troponin, CXR, EKG, BNP, COVID test. Will plan to ambulate to check Spo2.   2000: re-evaluated patient, no decline. Spo2 >90%.  Discussed work  up thus far and recommendation to discharge.  Patient states last time she came to ER for CP it was because of her low potassium, she also had jitteriness at that time.  HEART score 3 with DM, obesity.  H/o CHF with normal EF, OSA. Last seen by cardiology earlier this year for chest pressure and chronic dyspnea, not thought to be ischemic. She declined stress test. Suspect symptoms may be 2/2 deconditioning, progressive COPD. Given HEART score and benign cardiac work up today she is appropriate for discharge with PCP/cardiology f/u.  Return precautions given. Pending confirmatory COVID test.   Final Clinical Impressions(s) / ED Diagnoses   Final diagnoses:  Atypical chest pain  Hypokalemia    ED Discharge Orders    None       Arlean Hopping 02/01/19 2208    Margette Fast, MD 02/03/19 1355

## 2019-02-01 NOTE — ED Triage Notes (Signed)
BIB EMS, c/o feeling shaky, shortness of breath, chest discomfort, nausea, dry cough and generalized weakness and increased urine output.  Has history of CHF. EMS reports O2 dropped to 90% while ambulating to stretcher. Denies covid exposure, states she had a negative Covid screen last week

## 2019-02-01 NOTE — ED Notes (Signed)
Patient Alert and oriented to baseline. Stable and ambulatory to baseline. Patient verbalized understanding of the discharge instructions.  Patient belongings were taken by the patient.   

## 2019-02-01 NOTE — Discharge Instructions (Signed)
You were seen in the ER for chest discomfort "numb"  ER work up showed low potassium 2.9. this is likely from taking a diuretic.  You were given supplemental K here.  Please take 40 meq twice daily as usual.    Based on your risk factors, work up and exam you are considered are appropriate for discharge with close follow up with follow up with primary care doctor or cardiology for further outpatient work up as needed, if your chest discomfort continues  Call your primary care doctor or cardiologist as soon as possible to establish care and further discussion and work up of your symptoms on an outpatient setting  Please return to ED if: Your chest pain is worse or on exertion You have a cough that gets worse, or you cough up blood. You have severe pain in chest, back or abdomen. You have chest pain with loss of sensation, weakness or tingling in extremities You have chest pain or shortness of breath with exertion or activity You have sudden, unexplained discomfort in your chest, with radiation arms, back, neck, or jaw. You suddenly have chest pain and begin to sweat, or your skin gets clammy. You feel chest pain with nausea or vomiting, blood in vomit You suddenly feel light-headed or faint. Your heart begins to beat quickly, or it feels like it is skipping beats. You have one sided leg swelling or calf pain You have chest pain with fever, chills, cough or other viral symptoms

## 2019-02-01 NOTE — ED Notes (Signed)
O2 sats dropped to 89%. Pt placed on 2L per nasal cannula.

## 2019-02-02 LAB — SARS CORONAVIRUS 2 (TAT 6-24 HRS): SARS Coronavirus 2: NEGATIVE

## 2019-02-03 LAB — URINE CULTURE

## 2019-02-06 DIAGNOSIS — I10 Essential (primary) hypertension: Secondary | ICD-10-CM | POA: Diagnosis not present

## 2019-02-06 DIAGNOSIS — E1129 Type 2 diabetes mellitus with other diabetic kidney complication: Secondary | ICD-10-CM | POA: Diagnosis not present

## 2019-02-07 ENCOUNTER — Other Ambulatory Visit: Payer: Self-pay | Admitting: Orthopedic Surgery

## 2019-02-07 DIAGNOSIS — E669 Obesity, unspecified: Secondary | ICD-10-CM | POA: Diagnosis not present

## 2019-02-07 DIAGNOSIS — Z79899 Other long term (current) drug therapy: Secondary | ICD-10-CM | POA: Diagnosis not present

## 2019-02-07 DIAGNOSIS — D649 Anemia, unspecified: Secondary | ICD-10-CM | POA: Diagnosis not present

## 2019-02-07 DIAGNOSIS — Z6841 Body Mass Index (BMI) 40.0 and over, adult: Secondary | ICD-10-CM | POA: Diagnosis not present

## 2019-02-07 DIAGNOSIS — Z1231 Encounter for screening mammogram for malignant neoplasm of breast: Secondary | ICD-10-CM

## 2019-02-07 DIAGNOSIS — E876 Hypokalemia: Secondary | ICD-10-CM | POA: Diagnosis not present

## 2019-02-14 DIAGNOSIS — J449 Chronic obstructive pulmonary disease, unspecified: Secondary | ICD-10-CM | POA: Diagnosis not present

## 2019-02-15 DIAGNOSIS — R69 Illness, unspecified: Secondary | ICD-10-CM | POA: Diagnosis not present

## 2019-02-15 DIAGNOSIS — J329 Chronic sinusitis, unspecified: Secondary | ICD-10-CM | POA: Diagnosis not present

## 2019-02-15 DIAGNOSIS — J40 Bronchitis, not specified as acute or chronic: Secondary | ICD-10-CM | POA: Diagnosis not present

## 2019-02-15 DIAGNOSIS — Z20828 Contact with and (suspected) exposure to other viral communicable diseases: Secondary | ICD-10-CM | POA: Diagnosis not present

## 2019-02-19 DIAGNOSIS — G4733 Obstructive sleep apnea (adult) (pediatric): Secondary | ICD-10-CM | POA: Diagnosis not present

## 2019-03-06 DIAGNOSIS — N183 Chronic kidney disease, stage 3 unspecified: Secondary | ICD-10-CM | POA: Diagnosis not present

## 2019-03-09 DIAGNOSIS — N183 Chronic kidney disease, stage 3 unspecified: Secondary | ICD-10-CM | POA: Diagnosis not present

## 2019-03-09 DIAGNOSIS — E1121 Type 2 diabetes mellitus with diabetic nephropathy: Secondary | ICD-10-CM | POA: Diagnosis not present

## 2019-03-09 DIAGNOSIS — N1831 Chronic kidney disease, stage 3a: Secondary | ICD-10-CM | POA: Diagnosis not present

## 2019-03-09 DIAGNOSIS — Z6841 Body Mass Index (BMI) 40.0 and over, adult: Secondary | ICD-10-CM | POA: Diagnosis not present

## 2019-03-09 DIAGNOSIS — Z794 Long term (current) use of insulin: Secondary | ICD-10-CM | POA: Diagnosis not present

## 2019-03-09 DIAGNOSIS — E1129 Type 2 diabetes mellitus with other diabetic kidney complication: Secondary | ICD-10-CM | POA: Diagnosis not present

## 2019-03-09 DIAGNOSIS — I129 Hypertensive chronic kidney disease with stage 1 through stage 4 chronic kidney disease, or unspecified chronic kidney disease: Secondary | ICD-10-CM | POA: Diagnosis not present

## 2019-03-09 DIAGNOSIS — I10 Essential (primary) hypertension: Secondary | ICD-10-CM | POA: Diagnosis not present

## 2019-03-09 DIAGNOSIS — E782 Mixed hyperlipidemia: Secondary | ICD-10-CM | POA: Diagnosis not present

## 2019-03-12 ENCOUNTER — Emergency Department (HOSPITAL_COMMUNITY)
Admission: EM | Admit: 2019-03-12 | Discharge: 2019-03-13 | Disposition: A | Discharge status: Home / Self Care | Payer: Medicare HMO | Attending: Emergency Medicine | Admitting: Emergency Medicine

## 2019-03-12 ENCOUNTER — Other Ambulatory Visit: Payer: Self-pay

## 2019-03-12 ENCOUNTER — Encounter (HOSPITAL_COMMUNITY): Payer: Self-pay | Admitting: *Deleted

## 2019-03-12 DIAGNOSIS — R1030 Lower abdominal pain, unspecified: Secondary | ICD-10-CM | POA: Diagnosis present

## 2019-03-12 DIAGNOSIS — Z5321 Procedure and treatment not carried out due to patient leaving prior to being seen by health care provider: Secondary | ICD-10-CM | POA: Diagnosis not present

## 2019-03-12 DIAGNOSIS — K5900 Constipation, unspecified: Secondary | ICD-10-CM | POA: Diagnosis not present

## 2019-03-12 DIAGNOSIS — R112 Nausea with vomiting, unspecified: Secondary | ICD-10-CM | POA: Diagnosis not present

## 2019-03-12 LAB — CBC
HCT: 42.7 % (ref 36.0–46.0)
Hemoglobin: 13.4 g/dL (ref 12.0–15.0)
MCH: 27.3 pg (ref 26.0–34.0)
MCHC: 31.4 g/dL (ref 30.0–36.0)
MCV: 87 fL (ref 80.0–100.0)
Platelets: 387 10*3/uL (ref 150–400)
RBC: 4.91 MIL/uL (ref 3.87–5.11)
RDW: 16 % — ABNORMAL HIGH (ref 11.5–15.5)
WBC: 13.9 10*3/uL — ABNORMAL HIGH (ref 4.0–10.5)
nRBC: 0 % (ref 0.0–0.2)

## 2019-03-12 LAB — COMPREHENSIVE METABOLIC PANEL
ALT: 20 U/L (ref 0–44)
AST: 26 U/L (ref 15–41)
Albumin: 3.6 g/dL (ref 3.5–5.0)
Alkaline Phosphatase: 94 U/L (ref 38–126)
Anion gap: 15 (ref 5–15)
BUN: 25 mg/dL — ABNORMAL HIGH (ref 8–23)
CO2: 30 mmol/L (ref 22–32)
Calcium: 10.4 mg/dL — ABNORMAL HIGH (ref 8.9–10.3)
Chloride: 89 mmol/L — ABNORMAL LOW (ref 98–111)
Creatinine, Ser: 1.16 mg/dL — ABNORMAL HIGH (ref 0.44–1.00)
GFR calc Af Amer: 58 mL/min — ABNORMAL LOW (ref >60)
GFR calc non Af Amer: 50 mL/min — ABNORMAL LOW (ref >60)
Glucose, Bld: 322 mg/dL — ABNORMAL HIGH (ref 70–99)
Potassium: 3.1 mmol/L — ABNORMAL LOW (ref 3.5–5.1)
Sodium: 134 mmol/L — ABNORMAL LOW (ref 135–145)
Total Bilirubin: 0.6 mg/dL (ref 0.3–1.2)
Total Protein: 7.6 g/dL (ref 6.5–8.1)

## 2019-03-12 LAB — URINALYSIS, ROUTINE W REFLEX MICROSCOPIC
Bilirubin Urine: NEGATIVE
Glucose, UA: 150 mg/dL — AB
Hgb urine dipstick: NEGATIVE
Ketones, ur: NEGATIVE mg/dL
Leukocytes,Ua: NEGATIVE
Nitrite: NEGATIVE
Protein, ur: NEGATIVE mg/dL
Specific Gravity, Urine: 1.009 (ref 1.005–1.030)
pH: 6 (ref 5.0–8.0)

## 2019-03-12 LAB — LIPASE, BLOOD: Lipase: 28 U/L (ref 11–51)

## 2019-03-12 MED ORDER — SODIUM CHLORIDE 0.9% FLUSH: 3.0000 mL | Freq: Once | INTRAVENOUS | Status: DC

## 2019-03-12 NOTE — ED Triage Notes (Signed)
Pt is here with mid lower sharp abdominal pain.  Reports history of diverticulitis and part of her sigmoid colon removed already.  Pt reports constipation and gas (she is passing the gas).  Pt reports nausea no vomiting

## 2019-03-12 NOTE — ED Notes (Signed)
Pt called for vitals x3 with no response.

## 2019-03-13 ENCOUNTER — Ambulatory Visit (INDEPENDENT_AMBULATORY_CARE_PROVIDER_SITE_OTHER)
Admission: EM | Admit: 2019-03-13 | Discharge: 2019-03-13 | Disposition: A | Discharge status: Home / Self Care | Payer: Medicare HMO | Source: Home / Self Care

## 2019-03-13 DIAGNOSIS — R1032 Left lower quadrant pain: Secondary | ICD-10-CM | POA: Diagnosis not present

## 2019-03-13 DIAGNOSIS — R1031 Right lower quadrant pain: Secondary | ICD-10-CM

## 2019-03-13 MED ORDER — ONDANSETRON 4 MG PO TBDP: 4.0000 mg | ORAL_TABLET | Freq: Three times a day (TID) | ORAL | 0 refills | Status: DC | PRN

## 2019-03-13 MED ORDER — AMOXICILLIN-POT CLAVULANATE 875-125 MG PO TABS: 1.0000 | ORAL_TABLET | Freq: Two times a day (BID) | ORAL | 0 refills | Status: DC

## 2019-03-13 NOTE — ED Triage Notes (Signed)
Pt c/o lower abdominal pain after eating for the past 2 wks. States hx of diverticulitis, feels the same.

## 2019-03-13 NOTE — ED Provider Notes (Signed)
[facility]    CSN: [id number] Arrival date & time: 03/13/19  1512      History   Chief Complaint Chief Complaint  Patient presents with  . Abdominal Pain    HPI Kathleen Hatfield is a 64 y.o. female.   64 year old female with history of UC, DM, HLD, HTN, CHF, diverticulitis comes in for intermittent low abdominal pain with acute worsening for the past 2 days. States this is similar to her diverticulitis flair. Denies fever, chills, body aches, denies nausea, vomiting. Bilateral low abdominal pain that had been intermittent, worse with oral intake. States has had changes of bowel habits with more bloating. Took bentyl with some relief the past 2 days. Denies urinary symptoms such as frequency, dysuria, hematuria. Denies melena, hematochezia. Due for colonoscopy.      Past Medical History:  Diagnosis Date  . Anemia   . Arthritis   . Asthma   . CHF (congestive heart failure) ([place]) 05/2014  . Chronic diastolic heart failure ([place])   . CKD (chronic kidney disease)   . Depression   . Diabetes mellitus without complication ([place])   . Diverticulitis   . Dyspnea    with exertion  . GERD (gastroesophageal reflux disease)   . History of blood transfusion   . Hyperlipidemia    cannot tolerate statins  . Hypertension   . Peripheral neuropathy   . Pneumonia   . PONV (postoperative nausea and vomiting)   . Sleep apnea    uses CPAP  . Ulcerative colitis [facility])    dr Collene Mares    Patient Active Problem List   Diagnosis Date Noted  . Insulin-requiring or dependent type II diabetes mellitus ([place])   . COPD with acute exacerbation ([place]) 05/03/2017  . Influenza A   . Renal insufficiency   . Acute hypoxemic respiratory failure ([place]) 04/03/2016  . Acute bronchitis 04/02/2016  . Acute renal failure superimposed on stage 2 chronic kidney disease ([place]) 04/02/2016  . Respiratory distress 04/02/2016  . Diabetes mellitus without complication ([place])   . CKD (chronic kidney disease)   .  Stage 2 chronic kidney disease   . Solitary pulmonary nodule 11/20/2015  . Urinary retention 11/18/2015  . Hypoxemia 11/18/2015  . Incisional hernia s/p lap repair with mesh 11/15/2015 11/15/2015  . Hypersomnia with sleep apnea 09/16/2015  . Fatigue 06/07/2015  . Morbid obesity ([place]) 06/07/2015  . Cough 04/18/2015  . Folliculitis 38/93/7342  . Chronic diastolic heart failure ([place])   . OSA (obstructive sleep apnea) 08/23/2012  . Hypertension   . Depression   . GERD (gastroesophageal reflux disease)   . Hyperlipidemia   . Mild persistent chronic asthma without complication   . Dyspnea   . Ulcerative colitis ([place])   . Patellar tendinitis 05/25/2011  . Knee pain 05/25/2011  . Myofascial pain 05/25/2011  . Cervicalgia 05/25/2011    Past Surgical History:  Procedure Laterality Date  . ABDOMINAL HYSTERECTOMY    . APPENDECTOMY    . BREAST SURGERY     left biopsy  . CARDIAC CATHETERIZATION    . CESAREAN SECTION    . CHOLECYSTECTOMY    . HERNIA REPAIR    . INSERTION OF MESH N/A 11/15/2015   Procedure: INSERTION OF MESH;  Surgeon: [name], MD;  Location: [facility];  Service: General;  Laterality: N/A;  . LAPAROSCOPIC LYSIS OF ADHESIONS N/A 11/15/2015   Procedure: LAPAROSCOPIC LYSIS OF ADHESIONS;  Surgeon: [name], MD;  Location: [facility];  Service: General;  Laterality: N/A;  . RIGHT HEART CATHETERIZATION N/A 02/27/2013   Procedure: RIGHT HEART CATH;  Surgeon: Blane Ohara, MD;  Location: [facility] C[facility] LAB;  Service: Cardiovascular;  Laterality: N/A;  . RIGHT HEART CATHETERIZATION N/A 12/14/2013   Procedure: RIGHT HEART CATH;  Surgeon: Larey Dresser, MD;  Location: [facility] CATH LAB;  Service: Cardiovascular;  Laterality: N/A;  . SIGMOIDECTOMY  2010   diverticular disease  . VENTRAL HERNIA REPAIR N/A 11/15/2015   Procedure: LAPAROSCOPIC VENTRAL WALL HERNIA REPAIR;  Surgeon: [name], MD;  Location: [facility];  Service: General;  Laterality: N/A;    OB History   No obstetric history  on file.      Home Medications    Prior to Admission medications   Medication Sig Start Date End Date Taking? Authorizing Provider  acetaminophen (TYLENOL) 500 MG tablet Take 1,000 mg by mouth every 6 (six) hours as needed for pain.    [provider]  albuterol (PROAIR HFA) 108 (90 Base) MCG/ACT inhaler INHALE 2 PUFFS INTO THE LUNGS EVERY FOUR HOURS AS NEEDED FOR WHEEZING. Patient taking differently: Inhale 2 puffs into the lungs every 4 (four) hours as needed for wheezing.  05/21/17   Parrett, Fonnie Mu, NP  albuterol (PROVENTIL HFA;VENTOLIN HFA) 108 (90 Base) MCG/ACT inhaler Inhale 2 puffs into the lungs every 4 (four) hours as needed for wheezing or shortness of breath.    [provider]  amoxicillin-clavulanate (AUGMENTIN) 875-125 MG tablet Take 1 tablet by mouth every 12 (twelve) hours. 03/13/19   Tasia Catchings, Neviah Braud V, PA-C  BAYER CONTOUR NEXT TEST test strip 1 each by Other route daily. As directed 07/30/15   [provider]  budesonide (PULMICORT) 0.5 MG/2ML nebulizer solution Take 2 mLs by nebulization daily as needed for shortness of breath or wheezing. 11/12/17   [provider]  buPROPion (WELLBUTRIN XL) 150 MG 24 hr tablet Take 150 mg by mouth daily. 01/13/19   [provider]  Cholecalciferol (VITAMIN D-3) 5000 UNITS TABS Take 5,000 Units by mouth daily.     [provider]  DULoxetine (CYMBALTA) 60 MG capsule Take 60 mg by mouth daily.     [provider]  formoterol (PERFOROMIST) 20 MCG/2ML nebulizer solution Take 2 mLs (20 mcg total) by nebulization 2 (two) times daily. 09/06/18   [name], MD  furosemide (LASIX) 40 MG tablet Take 60 mg by mouth daily.     [provider]  gabapentin (NEURONTIN) 100 MG capsule Take 300 mg by mouth at bedtime.     [provider]  GLOBAL EASE INJECT PEN NEEDLES 32G X 4 MM MISC 1 each by Other route daily.  06/19/15   [provider]  Insulin Degludec (TRESIBA FLEXTOUCH)  200 UNIT/ML SOPN Inject 84 Units into the skin daily.     [provider]  Liraglutide (VICTOZA) 18 MG/3ML SOPN Inject 1.8 mg into the skin daily with lunch.     [provider]  metolazone (ZAROXOLYN) 5 MG tablet Take 2.5 mg by mouth daily as needed (Take with Lasix when weight is elevated 3 pounds overnight and 5 pounds or more for 2 days).     [provider]  MICROLET LANCETS MISC 1 each by Other route. As directed 07/30/15   [provider]  ondansetron (ZOFRAN ODT) 4 MG disintegrating tablet Take 1 tablet (4 mg total) by mouth every 8 (eight) hours as needed for nausea or vomiting. 03/13/19   Tasia Catchings, Arta Stump V, PA-C  pantoprazole ([place])  40 MG tablet Take 40 mg by mouth daily.  04/14/12   [provider]  polyethylene glycol (MIRALAX / GLYCOLAX) packet Take 17 g by mouth daily as needed for constipation.    [provider]  potassium chloride (K-DUR,KLOR-CON) 10 MEQ tablet Take 40 mEq by mouth 2 (two) times daily.     [provider]  rOPINIRole (REQUIP) 4 MG tablet Take 4-6 mg by mouth See admin instructions. Take 25m in the morning and 690mat bedtime    [provider]  traMADol (ULTRAM) 50 MG tablet Take 50 mg by mouth daily as needed for pain.    [provider]  UNABLE TO FIND CPAP at bedtime    [provider]    Family History Family History  Problem Relation Age of Onset  . Heart disease Mother   . Hypertension Mother   . Dementia Mother   . Heart disease Father   . COPD Father   . Hypertension Father     Social History Social History   Tobacco Use  . Smoking status: Never Smoker  . Smokeless tobacco: Never Used  Substance Use Topics  . Alcohol use: No  . Drug use: No     Allergies   Almond oil, Morphine and related, Atorvastatin, Ceclor [cefaclor], Ciprofloxacin, Levaquin [levofloxacin], Losartan, Statins, and Sulfa antibiotics   Review of Systems Review of Systems  Reason unable  to perform ROS: See HPI as above.     Physical Exam Triage Vital Signs ED Triage Vitals  Enc Vitals Group     BP 03/13/19 1527 123/75     Pulse Rate 03/13/19 1527 99     Resp 03/13/19 1527 18     Temp 03/13/19 1527 98.2 F (36.8 C)     Temp Source 03/13/19 1527 Oral     SpO2 03/13/19 1527 93 %     Weight --      Height --      Head Circumference --      Peak Flow --      Pain Score 03/13/19 1528 0     Pain Loc --      Pain Edu? --      Excl. in [place]--    No data found.  Updated Vital Signs BP 123/75 (BP Location: Left Arm)   Pulse 99   Temp 98.2 F (36.8 C) (Oral)   Resp 18   SpO2 93%   Physical Exam Constitutional:      General: She is not in acute distress.    Appearance: She is well-developed. She is not ill-appearing, toxic-appearing or diaphoretic.  HENT:     Head: Normocephalic and atraumatic.  Eyes:     Conjunctiva/sclera: Conjunctivae normal.     Pupils: Pupils are equal, round, and reactive to light.  Cardiovascular:     Rate and Rhythm: Normal rate and regular rhythm.     Heart sounds: Normal heart sounds. No murmur. No friction rub. No gallop.   Pulmonary:     Effort: Pulmonary effort is normal.     Breath sounds: Normal breath sounds. No wheezing or rales.  Abdominal:     General: Bowel sounds are normal.     Palpations: Abdomen is soft.     Tenderness: There is abdominal tenderness in the right lower quadrant and left lower quadrant. There is no right CVA tenderness, left CVA tenderness, guarding or rebound.  Skin:    General: Skin is warm and dry.  Neurological:  Mental Status: She is alert and oriented to person, place, and time.  Psychiatric:        Behavior: Behavior normal.        Judgment: Judgment normal.      UC Treatments / Results  Labs (all labs ordered are listed, but only abnormal results are displayed) Labs Reviewed - No data to display  EKG   Radiology No results found.  Procedures Procedures (including  critical care time)  Medications Ordered in UC Medications - No data to display  Initial Impression / Assessment and Plan / UC Course  I have reviewed the triage vital signs and the nursing notes.  Pertinent labs & imaging results that were available during my care of the patient were reviewed by me and considered in my medical decision making (see chart for details).    Patient went to ED last night with CBC, CMP, lipase, UA. She left prior to being seen. CMP showed creatinine of 1.16, increased from baseline 1.09 from nephrologist notes. Hyperglycemic at 322. CBC with WBC of 13.9. Patient with history of CHF, with fluid overload due to water intake. Will defer IV fluids at this time with patient increasing water intake with close monitoring of weight increase. Will start augmentin for possible diverticulitis. Patient to call PCP/nephrologist/GI for further monitoring. Strict return precautions given. Patient expresses understanding and agrees to plan.  Final Clinical Impressions(s) / UC Diagnoses   Final diagnoses:  Bilateral lower abdominal pain    ED Prescriptions    Medication Sig Dispense Auth. Provider   amoxicillin-clavulanate (AUGMENTIN) 875-125 MG tablet Take 1 tablet by mouth every 12 (twelve) hours. 14 tablet Madelina Sanda V, PA-C   ondansetron (ZOFRAN ODT) 4 MG disintegrating tablet Take 1 tablet (4 mg total) by mouth every 8 (eight) hours as needed for nausea or vomiting. 20 tablet Ok Edwards, PA-C     PDMP not reviewed this encounter.   Ok Edwards, PA-C 03/13/19 2201

## 2019-03-13 NOTE — Discharge Instructions (Signed)
Augmentin as directed. Keep hydrated, urine should be clear to pale yellow in color. As discussed, your kidney function slightly decreased, most likely due to dehydration. Please follow up with PCP/GI in 1 week for recheck. If worsening symptoms, go to the ED for further evaluation needed.

## 2019-03-17 DIAGNOSIS — J449 Chronic obstructive pulmonary disease, unspecified: Secondary | ICD-10-CM | POA: Diagnosis not present

## 2019-03-22 DIAGNOSIS — G4733 Obstructive sleep apnea (adult) (pediatric): Secondary | ICD-10-CM | POA: Diagnosis not present

## 2019-04-04 ENCOUNTER — Ambulatory Visit
Admission: RE | Admit: 2019-04-04 | Discharge: 2019-04-04 | Disposition: A | Discharge status: Home / Self Care | Payer: Medicare HMO | Source: Ambulatory Visit | Attending: Orthopedic Surgery | Admitting: Orthopedic Surgery

## 2019-04-04 ENCOUNTER — Other Ambulatory Visit: Payer: Self-pay

## 2019-04-04 DIAGNOSIS — Z1231 Encounter for screening mammogram for malignant neoplasm of breast: Secondary | ICD-10-CM | POA: Diagnosis not present

## 2019-04-08 DIAGNOSIS — E782 Mixed hyperlipidemia: Secondary | ICD-10-CM | POA: Diagnosis not present

## 2019-04-08 DIAGNOSIS — K219 Gastro-esophageal reflux disease without esophagitis: Secondary | ICD-10-CM | POA: Diagnosis not present

## 2019-04-08 DIAGNOSIS — I10 Essential (primary) hypertension: Secondary | ICD-10-CM | POA: Diagnosis not present

## 2019-04-14 ENCOUNTER — Other Ambulatory Visit: Payer: Self-pay

## 2019-04-14 ENCOUNTER — Ambulatory Visit (INDEPENDENT_AMBULATORY_CARE_PROVIDER_SITE_OTHER): Payer: Medicare HMO

## 2019-04-14 ENCOUNTER — Ambulatory Visit
Admission: EM | Admit: 2019-04-14 | Discharge: 2019-04-14 | Disposition: A | Discharge status: Home / Self Care | Payer: Medicare HMO | Attending: Physician Assistant | Admitting: Physician Assistant

## 2019-04-14 ENCOUNTER — Encounter: Payer: Self-pay | Admitting: Emergency Medicine

## 2019-04-14 DIAGNOSIS — M79674 Pain in right toe(s): Secondary | ICD-10-CM

## 2019-04-14 DIAGNOSIS — M7989 Other specified soft tissue disorders: Secondary | ICD-10-CM | POA: Diagnosis not present

## 2019-04-14 MED ORDER — INDOMETHACIN 50 MG PO CAPS: 50.0000 mg | ORAL_CAPSULE | Freq: Three times a day (TID) | ORAL | 0 refills | Status: DC

## 2019-04-14 NOTE — ED Triage Notes (Signed)
Pt presents to [facility] for assessment of right great toe pain x 2 days.  Denies known injury, but c/o swelling, redness and pain to area.

## 2019-04-14 NOTE — Discharge Instructions (Signed)
Xray without obvious injury/fracture, if radiology read is different, I will give you a call. As discussed, your history and exam is most consistent with gout. Indomethacin as directed. Take with pepcid. Monitor for spreading redness, warmth, fever, follow up for reevaluation.

## 2019-04-14 NOTE — ED Provider Notes (Signed)
[facility]    CSN: [id number] Arrival date & time: 04/14/19  1027      History   Chief Complaint Chief Complaint  Patient presents with  . Foot Pain    HPI Kathleen Hatfield is a 64 y.o. female.   64 year old female with history of DM, HTN, CHF, HLD, UC comes in for 2 day history of right great toe pain. States had been moving furniture, and unknown injury. However, has had redness, swelling and warmth to the area. Denies spreading erythema, fever. Denies numbness/tingling. Was recently started on a new medicine, but unknown name. States self discontinued 2-3 weeks ago. Takes lasix daily at baseline. Denies history of gout.      Past Medical History:  Diagnosis Date  . Anemia   . Arthritis   . Asthma   . CHF (congestive heart failure) ([place]) 05/2014  . Chronic diastolic heart failure ([place])   . CKD (chronic kidney disease)   . Depression   . Diabetes mellitus without complication ([place])   . Diverticulitis   . Dyspnea    with exertion  . GERD (gastroesophageal reflux disease)   . History of blood transfusion   . Hyperlipidemia    cannot tolerate statins  . Hypertension   . Peripheral neuropathy   . Pneumonia   . PONV (postoperative nausea and vomiting)   . Sleep apnea    uses CPAP  . Ulcerative colitis [facility])    dr Collene Mares    Patient Active Problem List   Diagnosis Date Noted  . Insulin-requiring or dependent type II diabetes mellitus ([place])   . COPD with acute exacerbation ([place]) 05/03/2017  . Influenza A   . Renal insufficiency   . Acute hypoxemic respiratory failure ([place]) 04/03/2016  . Acute bronchitis 04/02/2016  . Acute renal failure superimposed on stage 2 chronic kidney disease ([place]) 04/02/2016  . Respiratory distress 04/02/2016  . Diabetes mellitus without complication ([place])   . CKD (chronic kidney disease)   . Stage 2 chronic kidney disease   . Solitary pulmonary nodule 11/20/2015  . Urinary retention 11/18/2015  . Hypoxemia 11/18/2015  .  Incisional hernia s/p lap repair with mesh 11/15/2015 11/15/2015  . Hypersomnia with sleep apnea 09/16/2015  . Fatigue 06/07/2015  . Morbid obesity ([place]) 06/07/2015  . Cough 04/18/2015  . Folliculitis 78/24/2353  . Chronic diastolic heart failure ([place])   . OSA (obstructive sleep apnea) 08/23/2012  . Hypertension   . Depression   . GERD (gastroesophageal reflux disease)   . Hyperlipidemia   . Mild persistent chronic asthma without complication   . Dyspnea   . Ulcerative colitis ([place])   . Patellar tendinitis 05/25/2011  . Knee pain 05/25/2011  . Myofascial pain 05/25/2011  . Cervicalgia 05/25/2011    Past Surgical History:  Procedure Laterality Date  . ABDOMINAL HYSTERECTOMY    . APPENDECTOMY    . BREAST BIOPSY    . BREAST SURGERY     left biopsy  . CARDIAC CATHETERIZATION    . CESAREAN SECTION    . CHOLECYSTECTOMY    . HERNIA REPAIR    . INSERTION OF MESH N/A 11/15/2015   Procedure: INSERTION OF MESH;  Surgeon: [name], MD;  Location: [facility];  Service: General;  Laterality: N/A;  . LAPAROSCOPIC LYSIS OF ADHESIONS N/A 11/15/2015   Procedure: LAPAROSCOPIC LYSIS OF ADHESIONS;  Surgeon: [name], MD;  Location: [facility];  Service: General;  Laterality: N/A;  . RIGHT HEART CATHETERIZATION N/A 02/27/2013  Procedure: RIGHT HEART CATH;  Surgeon: Blane Ohara, MD;  Location: [facility] CATH LAB;  Service: Cardiovascular;  Laterality: N/A;  . RIGHT HEART CATHETERIZATION N/A 12/14/2013   Procedure: RIGHT HEART CATH;  Surgeon: Larey Dresser, MD;  Location: [facility] CATH LAB;  Service: Cardiovascular;  Laterality: N/A;  . SIGMOIDECTOMY  2010   diverticular disease  . VENTRAL HERNIA REPAIR N/A 11/15/2015   Procedure: LAPAROSCOPIC VENTRAL WALL HERNIA REPAIR;  Surgeon: [name], MD;  Location: [facility];  Service: General;  Laterality: N/A;    OB History   No obstetric history on file.      Home Medications    Prior to Admission medications   Medication Sig Start Date End Date  Taking? Authorizing Provider  acetaminophen (TYLENOL) 500 MG tablet Take 1,000 mg by mouth every 6 (six) hours as needed for pain.    [provider]  albuterol (PROAIR HFA) 108 (90 Base) MCG/ACT inhaler INHALE 2 PUFFS INTO THE LUNGS EVERY FOUR HOURS AS NEEDED FOR WHEEZING. Patient taking differently: Inhale 2 puffs into the lungs every 4 (four) hours as needed for wheezing.  05/21/17   Parrett, Fonnie Mu, NP  albuterol (PROVENTIL HFA;VENTOLIN HFA) 108 (90 Base) MCG/ACT inhaler Inhale 2 puffs into the lungs every 4 (four) hours as needed for wheezing or shortness of breath.    [provider]  BAYER CONTOUR NEXT TEST test strip 1 each by Other route daily. As directed 07/30/15   [provider]  budesonide (PULMICORT) 0.5 MG/2ML nebulizer solution Take 2 mLs by nebulization daily as needed for shortness of breath or wheezing. 11/12/17   [provider]  buPROPion (WELLBUTRIN XL) 150 MG 24 hr tablet Take 150 mg by mouth daily. 01/13/19   [provider]  Cholecalciferol (VITAMIN D-3) 5000 UNITS TABS Take 5,000 Units by mouth daily.     [provider]  DULoxetine (CYMBALTA) 60 MG capsule Take 60 mg by mouth daily.     [provider]  formoterol (PERFOROMIST) 20 MCG/2ML nebulizer solution Take 2 mLs (20 mcg total) by nebulization 2 (two) times daily. 09/06/18   [name], MD  furosemide (LASIX) 40 MG tablet Take 60 mg by mouth daily.     [provider]  gabapentin (NEURONTIN) 100 MG capsule Take 300 mg by mouth at bedtime.     [provider]  GLOBAL EASE INJECT PEN NEEDLES 32G X 4 MM MISC 1 each by Other route daily.  06/19/15   [provider]  indomethacin (INDOCIN) 50 MG capsule Take 1 capsule (50 mg total) by mouth 3 (three) times daily with meals. 04/14/19   Tasia Catchings, Vickey Boak V, PA-C  Insulin Degludec (TRESIBA FLEXTOUCH) 200 UNIT/ML SOPN Inject 84 Units into the skin daily.     [provider]  Liraglutide  (VICTOZA) 18 MG/3ML SOPN Inject 1.8 mg into the skin daily with lunch.     [provider]  metolazone (ZAROXOLYN) 5 MG tablet Take 2.5 mg by mouth daily as needed (Take with Lasix when weight is elevated 3 pounds overnight and 5 pounds or more for 2 days).     [provider]  MICROLET LANCETS MISC 1 each by Other route. As directed 07/30/15   [provider]  ondansetron (ZOFRAN ODT) 4 MG disintegrating tablet Take 1 tablet (4 mg total) by mouth every 8 (eight) hours as needed for nausea or vomiting. 03/13/19   Tasia Catchings, Jaclene Bartelt V, PA-C  pantoprazole (PROTONIX) 40 MG tablet Take 40 mg  by mouth daily.  04/14/12   [provider]  polyethylene glycol (MIRALAX / GLYCOLAX) packet Take 17 g by mouth daily as needed for constipation.    [provider]  potassium chloride (K-DUR,KLOR-CON) 10 MEQ tablet Take 40 mEq by mouth 2 (two) times daily.     [provider]  rOPINIRole (REQUIP) 4 MG tablet Take 4-6 mg by mouth See admin instructions. Take 69m in the morning and 678mat bedtime    [provider]  traMADol (ULTRAM) 50 MG tablet Take 50 mg by mouth daily as needed for pain.    [provider]  UNABLE TO FIND CPAP at bedtime    [provider]    Family History Family History  Problem Relation Age of Onset  . Heart disease Mother   . Hypertension Mother   . Dementia Mother   . Heart disease Father   . COPD Father   . Hypertension Father     Social History Social History   Tobacco Use  . Smoking status: Never Smoker  . Smokeless tobacco: Never Used  Substance Use Topics  . Alcohol use: No  . Drug use: No     Allergies   Almond oil, Morphine and related, Atorvastatin, Ceclor [cefaclor], Ciprofloxacin, Levaquin [levofloxacin], Losartan, Statins, and Sulfa antibiotics   Review of Systems Review of Systems  Reason unable to perform ROS: See HPI as above.     Physical Exam Triage Vital Signs ED Triage Vitals   Enc Vitals Group     BP 04/14/19 1038 127/81     Pulse Rate 04/14/19 1038 92     Resp 04/14/19 1038 20     Temp 04/14/19 1038 (!) 96.6 F (35.9 C)     Temp Source 04/14/19 1038 Temporal     SpO2 04/14/19 1038 93 %     Weight --      Height --      Head Circumference --      Peak Flow --      Pain Score 04/14/19 1043 10     Pain Loc --      Pain Edu? --      Excl. in [place]--    No data found.  Updated Vital Signs BP 127/81 (BP Location: Left Arm)   Pulse 92   Temp (!) 96.6 F (35.9 C) (Temporal)   Resp 20   SpO2 93%   Physical Exam Constitutional:      General: She is not in acute distress.    Appearance: Normal appearance. She is well-developed. She is not toxic-appearing or diaphoretic.  HENT:     Head: Normocephalic and atraumatic.  Eyes:     Conjunctiva/sclera: Conjunctivae normal.     Pupils: Pupils are equal, round, and reactive to light.  Pulmonary:     Effort: Pulmonary effort is normal. No respiratory distress.     Comments: Speaking in full sentences without difficulty Musculoskeletal:     Cervical back: Normal range of motion and neck supple.     Comments: Swelling, erythema, warmth to the 1st MTP joint, PIP. Tenderness to palpation of PIP. Decreased ROM. NVI.   No wounds, rashes seen.   Skin:    General: Skin is warm and dry.  Neurological:     Mental Status: She is alert and oriented to person, place, and time.      UC Treatments / Results  Labs (all labs ordered are listed, but only abnormal results are displayed) Labs Reviewed -  No data to display  EKG   Radiology DG Toe Great Right  Result Date: 04/14/2019 CLINICAL DATA:  Right great toe pain and swelling. EXAM: RIGHT GREAT TOE COMPARISON:  None. FINDINGS: There is no evidence of fracture or dislocation. The joint spaces are relatively well maintained with tiny marginal osteophyte around the first MTP joint. There is soft tissue swelling of the great toe. IMPRESSION: No acute osseous injury  of the right great toe. There is soft tissue swelling of the great toe. Electronically Signed   By: Kathreen Devoid   On: 04/14/2019 11:27    Procedures Procedures (including critical care time)  Medications Ordered in UC Medications - No data to display  Initial Impression / Assessment and Plan / UC Course  I have reviewed the triage vital signs and the nursing notes.  Pertinent labs & imaging results that were available during my care of the patient were reviewed by me and considered in my medical decision making (see chart for details).    Xray negative for fracture, dislocation. History and exam most consistent with gout. Will start indomethacin at this time. Return precautions given. Otherwise, follow up with PCP for workup/maintenance of gout as needed.  Final Clinical Impressions(s) / UC Diagnoses   Final diagnoses:  Great toe pain, right   ED Prescriptions    Medication Sig Dispense Auth. Provider   indomethacin (INDOCIN) 50 MG capsule Take 1 capsule (50 mg total) by mouth 3 (three) times daily with meals. 15 capsule Ok Edwards, PA-C     PDMP not reviewed this encounter.   Ok Edwards, PA-C 04/14/19 1148

## 2019-04-17 DIAGNOSIS — J449 Chronic obstructive pulmonary disease, unspecified: Secondary | ICD-10-CM | POA: Diagnosis not present

## 2019-04-22 DIAGNOSIS — G4733 Obstructive sleep apnea (adult) (pediatric): Secondary | ICD-10-CM | POA: Diagnosis not present

## 2019-04-26 ENCOUNTER — Encounter: Payer: Self-pay | Admitting: Family Medicine

## 2019-04-26 DIAGNOSIS — E1129 Type 2 diabetes mellitus with other diabetic kidney complication: Secondary | ICD-10-CM | POA: Diagnosis not present

## 2019-04-26 DIAGNOSIS — Z79899 Other long term (current) drug therapy: Secondary | ICD-10-CM | POA: Diagnosis not present

## 2019-04-26 DIAGNOSIS — E559 Vitamin D deficiency, unspecified: Secondary | ICD-10-CM | POA: Diagnosis not present

## 2019-04-26 DIAGNOSIS — E782 Mixed hyperlipidemia: Secondary | ICD-10-CM | POA: Diagnosis not present

## 2019-05-01 ENCOUNTER — Telehealth: Payer: Self-pay | Admitting: Cardiovascular Disease

## 2019-05-01 DIAGNOSIS — E1165 Type 2 diabetes mellitus with hyperglycemia: Secondary | ICD-10-CM | POA: Diagnosis not present

## 2019-05-01 DIAGNOSIS — I5189 Other ill-defined heart diseases: Secondary | ICD-10-CM | POA: Diagnosis not present

## 2019-05-01 DIAGNOSIS — E1140 Type 2 diabetes mellitus with diabetic neuropathy, unspecified: Secondary | ICD-10-CM | POA: Diagnosis not present

## 2019-05-01 DIAGNOSIS — Z13820 Encounter for screening for osteoporosis: Secondary | ICD-10-CM | POA: Diagnosis not present

## 2019-05-01 DIAGNOSIS — M109 Gout, unspecified: Secondary | ICD-10-CM | POA: Diagnosis not present

## 2019-05-01 DIAGNOSIS — K219 Gastro-esophageal reflux disease without esophagitis: Secondary | ICD-10-CM | POA: Diagnosis not present

## 2019-05-01 DIAGNOSIS — E876 Hypokalemia: Secondary | ICD-10-CM | POA: Diagnosis not present

## 2019-05-01 DIAGNOSIS — Z0000 Encounter for general adult medical examination without abnormal findings: Secondary | ICD-10-CM | POA: Diagnosis not present

## 2019-05-01 DIAGNOSIS — R69 Illness, unspecified: Secondary | ICD-10-CM | POA: Diagnosis not present

## 2019-05-01 NOTE — Telephone Encounter (Signed)
Ms. Venneman states she is having "fluid issues." She doesn't have any obvious swelling, but she assumes her swelling is in her torso/ABD since she has difficulty breathing once she goes 5 pounds over her baseline. She has gained about 5 pounds in 3 weeks. She has COPD and asthma and has never slept flat, but she does not notice her breathing any worse at night. She is taking lasix 60 mg qd.  She will limit her salt in her diet and elevate her legs if swelling occurs. Scheduled the patient for evaluation with Richardson Dopp next week. She will call prior to that visit if symptoms worsen. She was grateful for assistance.

## 2019-05-01 NOTE — Telephone Encounter (Signed)
New Message  Pt c/o swelling: STAT is pt has developed SOB within 24 hours  1) How much weight have you gained and in what time span? 5 lbs since 02/03  2) If swelling, where is the swelling located? No swelling  3) Are you currently taking a fluid pill? Yes, lasix  4) Are you currently SOB? Yes, since 02/03  5) Do you have a log of your daily weights (if so, list)? 02/03 255 lbs, today 260 lbs  6) Have you gained 3 pounds in a day or 5 pounds in a week? No  7) Have you traveled recently? Not sure  Pt also worsening CHF, pt only wanted to see Dr. Orlene Plum from [place] wanted for Dr. Burt Knack to contact the pt.

## 2019-05-02 DIAGNOSIS — I509 Heart failure, unspecified: Secondary | ICD-10-CM | POA: Diagnosis not present

## 2019-05-02 DIAGNOSIS — E261 Secondary hyperaldosteronism: Secondary | ICD-10-CM | POA: Diagnosis not present

## 2019-05-02 DIAGNOSIS — Z794 Long term (current) use of insulin: Secondary | ICD-10-CM | POA: Diagnosis not present

## 2019-05-02 DIAGNOSIS — E1165 Type 2 diabetes mellitus with hyperglycemia: Secondary | ICD-10-CM | POA: Diagnosis not present

## 2019-05-02 DIAGNOSIS — R69 Illness, unspecified: Secondary | ICD-10-CM | POA: Diagnosis not present

## 2019-05-02 DIAGNOSIS — E1140 Type 2 diabetes mellitus with diabetic neuropathy, unspecified: Secondary | ICD-10-CM | POA: Diagnosis not present

## 2019-05-02 DIAGNOSIS — E1122 Type 2 diabetes mellitus with diabetic chronic kidney disease: Secondary | ICD-10-CM | POA: Diagnosis not present

## 2019-05-02 DIAGNOSIS — G2581 Restless legs syndrome: Secondary | ICD-10-CM | POA: Diagnosis not present

## 2019-05-07 DIAGNOSIS — E1165 Type 2 diabetes mellitus with hyperglycemia: Secondary | ICD-10-CM | POA: Diagnosis not present

## 2019-05-07 DIAGNOSIS — I10 Essential (primary) hypertension: Secondary | ICD-10-CM | POA: Diagnosis not present

## 2019-05-07 DIAGNOSIS — E782 Mixed hyperlipidemia: Secondary | ICD-10-CM | POA: Diagnosis not present

## 2019-05-10 ENCOUNTER — Encounter: Payer: Self-pay | Admitting: Physician Assistant

## 2019-05-10 ENCOUNTER — Ambulatory Visit: Payer: Medicare HMO | Admitting: Physician Assistant

## 2019-05-10 ENCOUNTER — Other Ambulatory Visit: Payer: Self-pay

## 2019-05-10 VITALS — BP 118/64 | HR 95 | Ht 65.0 in | Wt 262.0 lb

## 2019-05-10 DIAGNOSIS — I5032 Chronic diastolic (congestive) heart failure: Secondary | ICD-10-CM

## 2019-05-10 DIAGNOSIS — R0602 Shortness of breath: Secondary | ICD-10-CM

## 2019-05-10 DIAGNOSIS — N1831 Chronic kidney disease, stage 3a: Secondary | ICD-10-CM | POA: Diagnosis not present

## 2019-05-10 DIAGNOSIS — I10 Essential (primary) hypertension: Secondary | ICD-10-CM

## 2019-05-10 DIAGNOSIS — E876 Hypokalemia: Secondary | ICD-10-CM | POA: Diagnosis not present

## 2019-05-10 DIAGNOSIS — R072 Precordial pain: Secondary | ICD-10-CM

## 2019-05-10 MED ORDER — SPIRONOLACTONE 25 MG PO TABS: 12.5000 mg | ORAL_TABLET | Freq: Every day | ORAL | 11 refills | Status: DC

## 2019-05-10 NOTE — Patient Instructions (Signed)
Medication Instructions:   START TAKING SPIRONOLACTONE 12.5 MG ONCE A DY    *If you need a refill on your cardiac medications before your next appointment, please call your pharmacy*   Lab Work:  TODAY   BMET  AND BNP  AND IN ONE WEEK  BMET   If you have labs (blood work) drawn today and your tests are completely normal, you will receive your results only by: Marland Kitchen MyChart Message (if you have MyChart) OR . A paper copy in the mail If you have any lab test that is abnormal or we need to change your treatment, we will call you to review the results.   Testing/Procedures:  Your physician has requested that you have an echocardiogram. Echocardiography is a painless test that uses sound waves to create images of your heart. It provides your doctor with information about the size and shape of your heart and how well your heart's chambers and valves are working. This procedure takes approximately one hour. There are no restrictions for this procedure.  Follow-Up: At [facility], you and your health needs are our priority.  As part of our continuing mission to provide you with exceptional heart care, we have created designated Provider Care Teams.  These Care Teams include your primary Cardiologist (physician) and Advanced Practice Providers (APPs -  Physician Assistants and Nurse Practitioners) who all work together to provide you with the care you need, when you need it.  We recommend signing up for the patient portal called "MyChart".  Sign up information is provided on this After Visit Summary.  MyChart is used to connect with patients for Virtual Visits (Telemedicine).  Patients are able to view lab/test results, encounter notes, upcoming appointments, etc.  Non-urgent messages can be sent to your provider as well.   To learn more about what you can do with MyChart, go to [email or web address].    Your next appointment:   3-4  week(s)  The format for your next appointment:   In  Person  Provider:   You may see [name], MD  or one of the following Advanced Practice Providers on your designated Care Team:    Richardson Dopp, PA-C  [place], [place]  Daune Perch, NP    Other Instructions

## 2019-05-10 NOTE — Progress Notes (Signed)
Cardiology Office Note:    Date:  05/10/2019   ID:  Kathleen Hatfield, DOB Jun 15, 1955, MRN [id number]  PCP:  Serita Grammes, MD  Cardiologist:  [name], MD   Electrophysiologist:  None  Nephrologist:  Dr. Neta Ehlers [facility]) Pulmonologist:  Dr. Halford Chessman  Referring MD: Serita Grammes, MD   Chief Complaint:  Shortness of Breath    Patient Profile:    Kathleen Hatfield is a 64 y.o. 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  She returns for follow-up.  She notes issues with chronic shortness of breath.  Her breathing seems to have been worse over the past few months.  Her primary care physician gave her metolazone to take as needed.  She takes this about 3 times a week.  She has had issues with low potassium.  She has significant symptoms with hypokalemia.  She describes chest discomfort (cramping).  She has been to the emergency room a couple of times with this.  She also notes chest tightness with exertion.  She has had this for years but it seems to be worse in the last few years.  She has not had syncope.  Recently, she has been sleeping sitting up.  She uses CPAP.  She has some leg swelling at times.     Past Medical History:  Diagnosis Date  . Anemia   . Arthritis   . Asthma   . CHF (congestive heart failure) ([place]) 05/2014  . Chronic diastolic heart failure ([place])   . CKD (chronic kidney disease)   . Depression   . Diabetes mellitus without complication ([place])   . Diverticulitis   .  Dyspnea    with exertion  . GERD (gastroesophageal reflux disease)   . History of blood transfusion   . Hyperlipidemia    cannot tolerate statins  . Hypertension   . Peripheral neuropathy   . Pneumonia   . PONV (postoperative nausea and vomiting)   . Sleep apnea    uses CPAP  . Ulcerative colitis ([place])    dr Collene Mares    Current Medications: Current Meds  Medication Sig  . acetaminophen (TYLENOL) 500 MG tablet Take 1,000 mg by mouth every 6 (six) hours as needed for pain.  Marland Kitchen albuterol (PROVENTIL HFA;VENTOLIN HFA) 108 (90 Base) MCG/ACT inhaler Inhale 2 puffs into the lungs every 4 (four) hours as needed for wheezing or shortness of breath.  Marland Kitchen BAYER CONTOUR NEXT TEST test strip 1 each by Other route daily. As directed  . budesonide (PULMICORT) 0.5 MG/2ML nebulizer solution Take 2 mLs by nebulization daily as needed for shortness of breath or wheezing.  Marland Kitchen buPROPion (WELLBUTRIN XL) 300 MG 24 hr tablet Take 1 tablet by mouth daily.  . Cholecalciferol (VITAMIN D-3) 5000 UNITS TABS Take 5,000 Units by mouth daily.   . DULoxetine (CYMBALTA) 60 MG capsule Take 60 mg by mouth daily.   . formoterol (PERFOROMIST) 20 MCG/2ML nebulizer solution Take 2 mLs (20 mcg total) by nebulization 2 (two) times daily.  Marland Kitchen  furosemide (LASIX) 40 MG tablet Take 60 mg by mouth daily.   Marland Kitchen gabapentin (NEURONTIN) 100 MG capsule Take 300 mg by mouth at bedtime.   Marland Kitchen GLOBAL EASE INJECT PEN NEEDLES 32G X 4 MM MISC 1 each by Other route daily.   . Insulin Degludec (TRESIBA FLEXTOUCH) 200 UNIT/ML SOPN Inject 84 Units into the skin daily.   . Liraglutide (VICTOZA) 18 MG/3ML SOPN Inject 1.8 mg into the skin daily with lunch.   . metolazone (ZAROXOLYN) 5 MG tablet Take 2.5 mg by mouth daily as needed (Take with Lasix when weight is elevated 3 pounds overnight and 5 pounds or more for 2 days).   Marland Kitchen MICROLET LANCETS MISC 1 each by Other route. As directed  . ondansetron (ZOFRAN ODT) 4 MG disintegrating tablet Take 1 tablet (4 mg  total) by mouth every 8 (eight) hours as needed for nausea or vomiting.  . pantoprazole (PROTONIX) 40 MG tablet Take 40 mg by mouth daily.   . polyethylene glycol (MIRALAX / GLYCOLAX) packet Take 17 g by mouth daily as needed for constipation.  . potassium chloride (K-DUR,KLOR-CON) 10 MEQ tablet Take 40 mEq by mouth 2 (two) times daily.   Marland Kitchen rOPINIRole (REQUIP) 4 MG tablet Take 4-6 mg by mouth See admin instructions. Take 18m in the morning and 665mat bedtime  . traMADol (ULTRAM) 50 MG tablet Take 50 mg by mouth daily as needed for pain.  . Marland KitchenNABLE TO FIND CPAP at bedtime     Allergies:   Almond oil, Morphine and related, Atorvastatin, Ceclor [cefaclor], Ciprofloxacin, Levaquin [levofloxacin], Losartan, Statins, and Sulfa antibiotics   Social History   Tobacco Use  . Smoking status: Never Smoker  . Smokeless tobacco: Never Used  Substance Use Topics  . Alcohol use: No  . Drug use: No     Family Hx: The patient's family history includes COPD in her father; Dementia in her mother; Heart disease in her father and mother; Hypertension in her father and mother.  ROS   EKGs/Labs/Other Test Reviewed:    EKG:  EKG is  ordered today.  The ekg ordered today demonstrates normal sinus rhythm, heart rate 93, left axis deviation, right bundle branch block, nonspecific ST-T wave changes, QTC 527, similar to old EKGs  Recent Labs: 02/01/2019: B Natriuretic Peptide 37.4 03/12/2019: ALT 20; BUN 25; Creatinine, Ser 1.16; Hemoglobin 13.4; Platelets 387; Potassium 3.1; Sodium 134   Recent Lipid Panel Lab Results  Component Value Date/Time   CHOL 236 (H) 03/25/2013 05:00 AM   TRIG 345 (H) 03/25/2013 05:00 AM   HDL 34 (L) 03/25/2013 05:00 AM   CHOLHDL 6.9 03/25/2013 05:00 AM   LDLCALC 133 (H) 03/25/2013 05:00 AM    Physical Exam:    VS:  BP 118/64   Pulse 95   Ht 5' 5"  (1.651 m)   Wt 262 lb (118.8 kg)   SpO2 96%   BMI 43.60 kg/m     Wt Readings from Last 3 Encounters:  05/10/19 262 lb  (118.8 kg)  02/01/19 258 lb (117 kg)  09/06/18 273 lb 12.8 oz (124.2 kg)     Constitutional:      Appearance: Healthy appearance. Not in distress.  Neck:     Thyroid: Thyroid normal.     Vascular: JVD normal.  Pulmonary:     Effort: Pulmonary effort is normal.     Breath sounds: Wheezing (faint bilat bases) present. No rales.  Cardiovascular:     Normal rate. Regular rhythm. Normal S1.  Normal S2.     Murmurs: There is no murmur.  Edema:    Peripheral edema absent.  Abdominal:     Palpations: Abdomen is soft. There is no hepatomegaly.  Skin:    General: Skin is warm and dry.  Neurological:     Mental Status: Alert and oriented to person, place and time.     Cranial Nerves: Cranial nerves are intact.       ASSESSMENT & PLAN:    1. Shortness of breath 2. Precordial chest pain 3. Chronic diastolic heart failure ([place]) She has a long history of chronic shortness of breath.  This has been mainly related to COPD in the setting of diastolic heart failure.  She has a history of dehydration on torsemide in the past.  She has been on furosemide since then.  She was on spironolactone at some point but this was stopped due to worsening kidney function.  She was given metolazone by her primary care physician.  She takes this as needed for weight gain.  Recently, she has been taking it about 3 times a week.  On exam today, she does not appear to be volume overloaded.  She has not had an echocardiogram since 2018.  At that time her LV function was normal.  She does have diabetes with a recent hemoglobin A1c in the 10 range.  She is certainly at risk for obstructive coronary artery disease.  However, I suspect her chest symptoms are more related to COPD than angina.  Her ECG does not demonstrate any acute findings.  I have recommended that she be cautious with using metolazone as she has had difficulty with hypokalemia.  I do not believe that we can control her heart rate enough to get a coronary  CTA.  -Arrange 2D echocardiogram  -Obtain BMET, BNP  -If her EF is down on echocardiogram, proceed with cardiac catheterization  -If EF normal, proceed with Lexiscan Myoview  -Follow-up with Dr. Burt Knack or me in 3 to 4 weeks.  4. Essential hypertension Blood pressure appears to be well controlled.  Continue current therapy.  5. Stage 3a chronic kidney disease As noted below, I will place her on spironolactone to help with her hypokalemia.  Given her chronic kidney disease, we will need to monitor her renal function closely.  She will have a follow-up BMET weekly x2.  6. Hypokalemia She has had significant issues with hypokalemia.  She notes going to the emergency room at one point with significant chest pain only to find out that her potassium was low.  She had improved symptoms with taking potassium supplementation.  Again, I have warned her to be cautious with using metolazone.  Continue current potassium supplementation dose.  Start spironolactone 12.5 mg daily.  Obtain BMET weekly x2.   Dispo:  Return in about 4 weeks (around 06/07/2019) for Follow up after testing, w/ Dr. Burt Knack, or Richardson Dopp, PA-C, in person.   Medication Adjustments/Labs and Tests Ordered: Current medicines are reviewed at length with the patient today.  Concerns regarding medicines are outlined above.  Tests Ordered: Orders Placed This Encounter  Procedures  . Basic metabolic panel  . Pro b natriuretic peptide (BNP)  . Basic metabolic panel  . EKG 12-Lead  . ECHOCARDIOGRAM COMPLETE   Medication Changes: Meds ordered this encounter  Medications  . spironolactone (ALDACTONE) 25 MG tablet    Sig: Take 0.5 tablets (12.5 mg total) by mouth daily.    Dispense:  15 tablet    Refill:  11    Order Specific Question:   Supervising Provider    Answer:   Lelon Perla [[id number]]    Signed, Richardson Dopp, PA-C  05/10/2019 5:58 PM    [facility] [address], [place], [place]   [address] Phone: [phone]; Fax: [phone]

## 2019-05-11 LAB — BASIC METABOLIC PANEL
BUN/Creatinine Ratio: 21 (ref 12–28)
BUN: 22 mg/dL (ref 8–27)
CO2: 30 mmol/L — ABNORMAL HIGH (ref 20–29)
Calcium: 10.5 mg/dL — ABNORMAL HIGH (ref 8.7–10.3)
Chloride: 94 mmol/L — ABNORMAL LOW (ref 96–106)
Creatinine, Ser: 1.03 mg/dL — ABNORMAL HIGH (ref 0.57–1.00)
GFR calc Af Amer: 66 mL/min/{1.73_m2} (ref >59)
GFR calc non Af Amer: 58 mL/min/{1.73_m2} — ABNORMAL LOW (ref >59)
Glucose: 165 mg/dL — ABNORMAL HIGH (ref 65–99)
Potassium: 4.2 mmol/L (ref 3.5–5.2)
Sodium: 141 mmol/L (ref 134–144)

## 2019-05-11 LAB — PRO B NATRIURETIC PEPTIDE: NT-Pro BNP: 118 pg/mL (ref 0–287)

## 2019-05-12 ENCOUNTER — Ambulatory Visit (INDEPENDENT_AMBULATORY_CARE_PROVIDER_SITE_OTHER)
Admission: RE | Admit: 2019-05-12 | Discharge: 2019-05-12 | Disposition: A | Discharge status: Home / Self Care | Payer: Medicare HMO | Source: Ambulatory Visit

## 2019-05-12 ENCOUNTER — Inpatient Hospital Stay: Admission: RE | Admit: 2019-05-12 | Payer: Medicare HMO | Source: Ambulatory Visit

## 2019-05-12 ENCOUNTER — Other Ambulatory Visit: Payer: Self-pay

## 2019-05-12 DIAGNOSIS — M79674 Pain in right toe(s): Secondary | ICD-10-CM

## 2019-05-12 MED ORDER — INDOMETHACIN 50 MG PO CAPS: 50.0000 mg | ORAL_CAPSULE | Freq: Three times a day (TID) | ORAL | 0 refills | Status: DC

## 2019-05-12 NOTE — ED Provider Notes (Signed)
Virtual Visit via Video Note:  Kathleen Hatfield  initiated request for Telemedicine visit with [facility] team. I connected with Kathleen Hatfield  on 05/12/2019 at 5:29 PM  for a synchronized telemedicine visit using a video enabled HIPPA compliant telemedicine application. I verified that I am speaking with Kathleen Hatfield  using two identifiers. Kathleen Salberg Jodell Cipro, PA-C  was physically located in a [facility] Urgent care site and Kathleen Hatfield was located at a different location.   The limitations of evaluation and management by telemedicine as well as the availability of in-person appointments were discussed. Patient was informed that she  may incur a bill ( including co-pay) for this virtual visit encounter. Kathleen Hatfield  expressed understanding and gave verbal consent to proceed with virtual visit.     History of Present Illness:Kathleen Hatfield  is a 64 y.o. female with history of DM, HTN, CHF, HLD, UC presents with acute onset of right great toe pain, swelling, erythema earlier today. Denies injury/trauma. Pain to the middle/distal toe without numbness/tingling. Denies open wounds. Denies fever, chills, body aches. States pain is now triggered by wearing socks. Hast history of gout that feels similar. Recently started on spironolactone per cardiology. No changes in diuretics.   Past Medical History:  Diagnosis Date  . Anemia   . Arthritis   . Asthma   . CHF (congestive heart failure) ([place]) 05/2014  . Chronic diastolic heart failure ([place])   . CKD (chronic kidney disease)   . Depression   . Diabetes mellitus without complication ([place])   . Diverticulitis   . Dyspnea    with exertion  . GERD (gastroesophageal reflux disease)   . History of blood transfusion   . Hyperlipidemia    cannot tolerate statins  . Hypertension   . Peripheral neuropathy   . Pneumonia   . PONV (postoperative nausea and vomiting)   . Sleep apnea    uses CPAP  . Ulcerative colitis ([place])    dr Collene Mares    Allergies  Allergen  Reactions  . Almond Oil Anaphylaxis  . Morphine And Related Shortness Of Breath and Other (See Comments)    Pt. States while in the hospital it affected her breathing, O2 dropped to the 70's  . Atorvastatin Other (See Comments)    Leg weakness  . Ceclor [Cefaclor] Nausea And Vomiting  . Ciprofloxacin Other (See Comments)    Makes joints and muscles ache  . Levaquin [Levofloxacin] Other (See Comments)    Body aches  . Losartan Other (See Comments)    Weakness   . Statins     Leg and body weakness  . Sulfa Antibiotics Nausea And Vomiting        Observations/Objective: General: Well appearing, nontoxic, no acute distress. Sitting comfortably. Head: Normocephalic, atraumatic Eye: No conjunctival injection, eyelid swelling. EOMI ENT: Mucus membranes moist, no lip cracking. No obvious nasal drainage. Pulm: Speaking in full sentences without difficulty. Normal effort. No respiratory distress, accessory muscle use. MSK: limited exam due to video visit. Slight erythema seen to lateral right great toe along DIP area. Patient with tenderness along DIP. No tenderness to MCP joint. Able flex MCP joint.  Neuro: Normal mental status. Alert and oriented x 3.   Assessment and Plan: Will cover for gout with indomethacin as patient tolerated well last visit. Discussed to monitor for signs of infection. Discussed use of diuretic can cause increased gout flare. If continues with frequent flare ups, to discussed  with cardiology. Return precautions given. Patient expresses understanding and agrees to plan.  Follow Up Instructions:    I discussed the assessment and treatment plan with the patient. The patient was provided an opportunity to ask questions and all were answered. The patient agreed with the plan and demonstrated an understanding of the instructions.   The patient was advised to call back or seek an in-person evaluation if the symptoms worsen or if the condition fails to improve as  anticipated.  I provided 10 minutes of non-face-to-face time during this encounter.    Ok Edwards, PA-C  05/12/2019 5:29 PM         Ok Edwards, PA-C 05/12/19 [id number]

## 2019-05-12 NOTE — Discharge Instructions (Signed)
Start indomethacin as directed for possible gout. Continue to monitor for possible infection symptoms such as spreading redness, warmth, fever, chills, follow up for reevaluation. Discussed lasix use with your cardiologist as this could increase your chances at getting gout.

## 2019-05-15 DIAGNOSIS — J449 Chronic obstructive pulmonary disease, unspecified: Secondary | ICD-10-CM | POA: Diagnosis not present

## 2019-05-18 ENCOUNTER — Other Ambulatory Visit: Payer: Medicare HMO

## 2019-05-18 ENCOUNTER — Other Ambulatory Visit: Payer: Self-pay

## 2019-05-18 ENCOUNTER — Encounter: Payer: Self-pay | Admitting: Physician Assistant

## 2019-05-18 ENCOUNTER — Ambulatory Visit (HOSPITAL_COMMUNITY): Payer: Medicare HMO | Attending: Internal Medicine

## 2019-05-18 DIAGNOSIS — E876 Hypokalemia: Secondary | ICD-10-CM

## 2019-05-18 DIAGNOSIS — R0602 Shortness of breath: Secondary | ICD-10-CM | POA: Diagnosis not present

## 2019-05-18 DIAGNOSIS — R072 Precordial pain: Secondary | ICD-10-CM

## 2019-05-18 DIAGNOSIS — I10 Essential (primary) hypertension: Secondary | ICD-10-CM

## 2019-05-18 DIAGNOSIS — I5032 Chronic diastolic (congestive) heart failure: Secondary | ICD-10-CM | POA: Diagnosis not present

## 2019-05-18 LAB — BASIC METABOLIC PANEL
BUN/Creatinine Ratio: 22 (ref 12–28)
BUN: 29 mg/dL — ABNORMAL HIGH (ref 8–27)
CO2: 27 mmol/L (ref 20–29)
Calcium: 10.2 mg/dL (ref 8.7–10.3)
Chloride: 94 mmol/L — ABNORMAL LOW (ref 96–106)
Creatinine, Ser: 1.33 mg/dL — ABNORMAL HIGH (ref 0.57–1.00)
GFR calc Af Amer: 49 mL/min/{1.73_m2} — ABNORMAL LOW (ref >59)
GFR calc non Af Amer: 42 mL/min/{1.73_m2} — ABNORMAL LOW (ref >59)
Glucose: 149 mg/dL — ABNORMAL HIGH (ref 65–99)
Potassium: 4.4 mmol/L (ref 3.5–5.2)
Sodium: 139 mmol/L (ref 134–144)

## 2019-05-18 NOTE — Addendum Note (Signed)
Addended byKathlen Mody, Nicki Reaper T on: 05/18/2019 03:52 PM   Modules accepted: Orders

## 2019-05-19 ENCOUNTER — Telehealth: Payer: Self-pay | Admitting: Physician Assistant

## 2019-05-19 NOTE — Telephone Encounter (Signed)
Kathleen Hatfield is returning Kathleen Hatfield's call regarding results.

## 2019-05-19 NOTE — Progress Notes (Signed)
Instructions sent through mychart.

## 2019-05-19 NOTE — Telephone Encounter (Signed)
The patient has been notified of the result and verbalized understanding.  All questions (if any) were answered. Patient has been set up for [facility] and instructions sent through my chart. Mady Haagensen, Bertha 05/19/2019 11:35 AM

## 2019-05-20 DIAGNOSIS — G4733 Obstructive sleep apnea (adult) (pediatric): Secondary | ICD-10-CM | POA: Diagnosis not present

## 2019-05-23 DIAGNOSIS — K219 Gastro-esophageal reflux disease without esophagitis: Secondary | ICD-10-CM | POA: Diagnosis not present

## 2019-05-23 DIAGNOSIS — K5900 Constipation, unspecified: Secondary | ICD-10-CM | POA: Diagnosis not present

## 2019-05-23 DIAGNOSIS — K5730 Diverticulosis of large intestine without perforation or abscess without bleeding: Secondary | ICD-10-CM | POA: Diagnosis not present

## 2019-05-23 DIAGNOSIS — Z1211 Encounter for screening for malignant neoplasm of colon: Secondary | ICD-10-CM | POA: Diagnosis not present

## 2019-05-23 DIAGNOSIS — K5150 Left sided colitis without complications: Secondary | ICD-10-CM | POA: Diagnosis not present

## 2019-05-25 DIAGNOSIS — R69 Illness, unspecified: Secondary | ICD-10-CM | POA: Diagnosis not present

## 2019-05-26 ENCOUNTER — Ambulatory Visit: Payer: Medicare HMO | Admitting: Family Medicine

## 2019-06-01 ENCOUNTER — Telehealth (HOSPITAL_COMMUNITY): Payer: Self-pay

## 2019-06-01 NOTE — Telephone Encounter (Signed)
Spoke with the patient, detailed instructions given. She stated that she would be here for her test and understood the instructions. Asked to call back with any questions. S.Layann Bluett EMTP

## 2019-06-06 ENCOUNTER — Other Ambulatory Visit: Payer: Self-pay

## 2019-06-06 ENCOUNTER — Ambulatory Visit (HOSPITAL_COMMUNITY): Payer: Medicare HMO | Attending: Cardiology

## 2019-06-06 DIAGNOSIS — R0602 Shortness of breath: Secondary | ICD-10-CM

## 2019-06-06 DIAGNOSIS — I5032 Chronic diastolic (congestive) heart failure: Secondary | ICD-10-CM | POA: Diagnosis not present

## 2019-06-06 DIAGNOSIS — R072 Precordial pain: Secondary | ICD-10-CM

## 2019-06-06 MED ORDER — REGADENOSON 0.4 MG/5ML IV SOLN
0.4000 mg | Freq: Once | INTRAVENOUS | Status: AC
  Administered 2019-06-06: 0.4 mg via INTRAVENOUS

## 2019-06-06 MED ORDER — TECHNETIUM TC 99M TETROFOSMIN IV KIT
31.4000 | PACK | Freq: Once | INTRAVENOUS | Status: AC | PRN
  Administered 2019-06-06: 31.4 via INTRAVENOUS
  Filled 2019-06-06: qty 32

## 2019-06-07 ENCOUNTER — Ambulatory Visit (HOSPITAL_COMMUNITY): Payer: Medicare HMO | Attending: Cardiovascular Disease

## 2019-06-07 DIAGNOSIS — E1140 Type 2 diabetes mellitus with diabetic neuropathy, unspecified: Secondary | ICD-10-CM | POA: Diagnosis not present

## 2019-06-07 DIAGNOSIS — I10 Essential (primary) hypertension: Secondary | ICD-10-CM | POA: Diagnosis not present

## 2019-06-07 LAB — MYOCARDIAL PERFUSION IMAGING
LV dias vol: 55 mL (ref 46–106)
LV sys vol: 10 mL
Peak HR: 100 {beats}/min
Rest HR: 87 {beats}/min
SDS: 7
SRS: 4
SSS: 12
TID: 0.85

## 2019-06-07 MED ORDER — TECHNETIUM TC 99M TETROFOSMIN IV KIT
30.6000 | PACK | Freq: Once | INTRAVENOUS | Status: AC | PRN
  Administered 2019-06-07: 30.6 via INTRAVENOUS
  Filled 2019-06-07: qty 31

## 2019-06-08 ENCOUNTER — Encounter: Payer: Self-pay | Admitting: Physician Assistant

## 2019-06-08 NOTE — Progress Notes (Signed)
Cardiology Office Note:    Date:  06/09/2019   ID:  Kathleen Hatfield, DOB [birth year], MRN [id number]  PCP:  [name], MD  Cardiologist:  [name], MD   Electrophysiologist:  None  Nephrologist:  Dr. Neta Ehlers [facility]) Pulmonologist:  Dr. Halford Chessman  Referring MD: Serita Grammes, MD   Chief Complaint:  No chief complaint on file.    Patient Profile:    Kathleen Hatfield is a 64 y.o. 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 Intolerant of statins  GERD  Diverticulitis s/p partial colectomy  Prior CV studies: Echocardiogram 05/18/2019 EF 70, no RWMA, mild LVH, Gr 1 DD, normal RVSF, severe RVH, Asc Ao 39 mm  Myoview 06/07/2019 EF 83, no ischemia or infarction; Low Risk  Echocardiogram 04/03/2016 Mild LVH, EF 65-70, no RWMA, Gr 1 DD, LVOT gradient (2.6 m/s) likely related to hyperdynamic LVF  Myoview 06/23/2012 No ischemia, EF 82; Low Risk  History of Present Illness:    [name] was last seen 05/10/2019 for shortness of breath and symptoms of congestive heart failure.  A BNP was normal.  An echocardiogram showed normal ejection fraction.  There was mild diastolic dysfunction, mild LVH.  A Myoview was low risk and neg for ischemia.  She returns for follow up.  She is here alone.  Her breathing remains about the same.  She has to sleep on an incline.  She gets out of breath with minimal activity.  Her weight has been stable.  She has not had further chest discomfort.  She has not had syncope.  She has not had any bleeding issues.  Past Medical History:  Diagnosis Date  . Anemia   . Arthritis   . Asthma   . Chronic diastolic CHF 40/9811   Echocardiogram 05/2019: EF 70, no RWMA, mild LVH, Gr 1 DD, normal RVSF, severe LVH, borderline asc Aorta (39 mm)  .  CKD (chronic kidney disease)   . Depression   . Diabetes mellitus without complication ([place])   . Diverticulitis   . Dyspnea    with exertion  . GERD (gastroesophageal reflux disease)   . History of blood transfusion   . Hyperlipidemia    cannot tolerate statins  . Hypertension   . Nuclear stress test    Myoview 05/2019: EF 83, no ischemia or infarction; Low Risk  . Peripheral neuropathy   . Pneumonia   . PONV (postoperative nausea and vomiting)   . Sleep apnea    uses CPAP  . Ulcerative colitis ([place])    dr Collene Mares    Current Medications: Current Meds  Medication Sig  . acetaminophen (TYLENOL) 500 MG tablet Take 1,000 mg by mouth every 6 (six) hours as needed for pain.  Marland Kitchen albuterol (PROVENTIL HFA;VENTOLIN HFA) 108 (90 Base) MCG/ACT inhaler Inhale 2 puffs into the lungs every 4 (four) hours as needed for wheezing or shortness of breath.  Marland Kitchen BAYER CONTOUR NEXT TEST test strip 1 each by Other route daily. As directed  . budesonide (PULMICORT) 0.5 MG/2ML nebulizer solution Take 2 mLs by nebulization daily as needed for shortness of breath or wheezing.  Marland Kitchen buPROPion (WELLBUTRIN XL) 300 MG 24 hr tablet Take 1 tablet by mouth daily.  . Cholecalciferol (VITAMIN D-3) 5000 UNITS TABS Take 5,000 Units  by mouth every other day.   . DULoxetine (CYMBALTA) 60 MG capsule Take 60 mg by mouth daily.   . formoterol (PERFOROMIST) 20 MCG/2ML nebulizer solution Take 2 mLs (20 mcg total) by nebulization 2 (two) times daily.  . furosemide (LASIX) 40 MG tablet Take 60 mg by mouth daily.   Marland Kitchen gabapentin (NEURONTIN) 100 MG capsule Take 300 mg by mouth at bedtime.   Marland Kitchen GLOBAL EASE INJECT PEN NEEDLES 32G X 4 MM MISC 1 each by Other route daily.   . indomethacin (INDOCIN) 50 MG capsule Take 50 mg by mouth 3 (three) times daily as needed.  . Insulin Degludec (TRESIBA FLEXTOUCH) 200 UNIT/ML SOPN Inject 84 Units into the skin daily.   Marland Kitchen ipratropium-albuterol (DUONEB) 0.5-2.5 (3) MG/3ML SOLN Take 3 mLs by nebulization  every 6 (six) hours.  . Liraglutide (VICTOZA) 18 MG/3ML SOPN Inject 1.8 mg into the skin daily with lunch.   . metolazone (ZAROXOLYN) 5 MG tablet Take 2.5 mg by mouth daily as needed (Take with Lasix when weight is elevated 3 pounds overnight and 5 pounds or more for 2 days).   Marland Kitchen MICROLET LANCETS MISC 1 each by Other route. As directed  . ondansetron (ZOFRAN ODT) 4 MG disintegrating tablet Take 1 tablet (4 mg total) by mouth every 8 (eight) hours as needed for nausea or vomiting.  . pantoprazole (PROTONIX) 40 MG tablet Take 40 mg by mouth daily.   . polyethylene glycol (MIRALAX / GLYCOLAX) packet Take 17 g by mouth daily as needed for constipation.  . potassium chloride (K-DUR,KLOR-CON) 10 MEQ tablet Take 40 mEq by mouth 2 (two) times daily.   Marland Kitchen rOPINIRole (REQUIP) 4 MG tablet Take 4-6 mg by mouth See admin instructions. Take 73m in the morning and 662mat bedtime  . spironolactone (ALDACTONE) 25 MG tablet Take 0.5 tablets (12.5 mg total) by mouth daily.  . traMADol (ULTRAM) 50 MG tablet Take 50 mg by mouth daily as needed for pain.  . Marland KitchenNABLE TO FIND CPAP at bedtime     Allergies:   Almond oil, Morphine and related, Atorvastatin, Ceclor [cefaclor], Ciprofloxacin, Levaquin [levofloxacin], Losartan, Statins, and Sulfa antibiotics   Social History   Tobacco Use  . Smoking status: Never Smoker  . Smokeless tobacco: Never Used  Substance Use Topics  . Alcohol use: No  . Drug use: No     Family Hx: The patient's family history includes COPD in her father; Dementia in her mother; Heart disease in her father and mother; Hypertension in her father and mother.  ROS   EKGs/Labs/Other Test Reviewed:    EKG:  EKG is not ordered today.  The ekg ordered today demonstrates n/a  Recent Labs: 02/01/2019: B Natriuretic Peptide 37.4 03/12/2019: ALT 20; Hemoglobin 13.4; Platelets 387 05/10/2019: NT-Pro BNP 118 05/18/2019: BUN 29; Creatinine, Ser 1.33; Potassium 4.4; Sodium 139   Recent Lipid Panel Lab  Results  Component Value Date/Time   CHOL 236 (H) 03/25/2013 05:00 AM   TRIG 345 (H) 03/25/2013 05:00 AM   HDL 34 (L) 03/25/2013 05:00 AM   CHOLHDL 6.9 03/25/2013 05:00 AM   LDLCALC 133 (H) 03/25/2013 05:00 AM    Physical Exam:    VS:  BP 124/78   Pulse 78   Ht 5' 5"  (1.651 m)   Wt 260 lb 12.8 oz (118.3 kg)   SpO2 95%   BMI 43.40 kg/m     Wt Readings from Last 3 Encounters:  06/09/19 260 lb 12.8 oz (118.3 kg)  06/06/19  262 lb (118.8 kg)  05/10/19 262 lb (118.8 kg)     Constitutional:      Appearance: Healthy appearance. Not in distress.  Pulmonary:     Breath sounds: Bilateral Wheezing (expiratory, faint) present.  Cardiovascular:     Normal rate. Regular rhythm. Normal S1. Normal S2.     Murmurs: There is no murmur.  Edema:    Peripheral edema absent.  Abdominal:     Palpations: Abdomen is soft.  Musculoskeletal:     Cervical back: Neck supple. Skin:    General: Skin is warm and dry.  Neurological:     General: No focal deficit present.     Mental Status: Alert and oriented to person, place and time.       ASSESSMENT & PLAN:    1. Chronic diastolic heart failure (HCC) Echocardiogram 05/18/2019 demonstrated hyperdynamic LV function with an EF of 93% and mild diastolic dysfunction.  She did have increased flow velocities.  She also has right ventricular hypertrophy.  I suspect that this is related to her lung disease, sleep apnea and morbid obesity.  I have suggested that we try her on a cardioselective beta-blocker to slow her down somewhat.  She may feel better with a slower heart rate.  I will place her on metoprolol succinate 12.5 mg daily.  I do not suspect that this will exacerbate wheezing.  However, she knows to contact me if she feels more short of breath or develops more wheezing.  I again have cautioned her to try to avoid metolazone if possible.  She has felt better since her potassium has been normal.  Since she had high flow velocities on her  echocardiogram, I will obtain a TSH and CBC in addition to her repeat BMET today.  2. Essential hypertension The patient's blood pressure is controlled on her current regimen.  Continue current therapy.    3. Stage 3a chronic kidney disease Recent creatinine stable.  Repeat BMET today.     Dispo:  Return in about 6 months (around 12/09/2019) for Routine Follow Up, w/ Dr. Burt Knack, or Richardson Dopp, PA-C, in person.   Medication Adjustments/Labs and Tests Ordered: Current medicines are reviewed at length with the patient today.  Concerns regarding medicines are outlined above.  Tests Ordered: Orders Placed This Encounter  Procedures  . Basic metabolic panel  . CBC  . TSH   Medication Changes: Meds ordered this encounter  Medications  . metoprolol succinate (TOPROL-XL) 25 MG 24 hr tablet    Sig: Take 0.5 tablets (12.5 mg total) by mouth daily.    Dispense:  45 tablet    Refill:  1    Signed, Richardson Dopp, PA-C  06/09/2019 12:49 PM    [address], [place], [place]  [address] Phone: [phone]; Fax: [phone]

## 2019-06-09 ENCOUNTER — Ambulatory Visit: Payer: Medicare HMO | Admitting: Physician Assistant

## 2019-06-09 ENCOUNTER — Other Ambulatory Visit: Payer: Self-pay

## 2019-06-09 ENCOUNTER — Encounter: Payer: Self-pay | Admitting: Physician Assistant

## 2019-06-09 VITALS — BP 124/78 | HR 78 | Ht 65.0 in | Wt 260.8 lb

## 2019-06-09 DIAGNOSIS — I10 Essential (primary) hypertension: Secondary | ICD-10-CM

## 2019-06-09 DIAGNOSIS — N1831 Chronic kidney disease, stage 3a: Secondary | ICD-10-CM

## 2019-06-09 DIAGNOSIS — I5032 Chronic diastolic (congestive) heart failure: Secondary | ICD-10-CM

## 2019-06-09 DIAGNOSIS — T466X5A Adverse effect of antihyperlipidemic and antiarteriosclerotic drugs, initial encounter: Secondary | ICD-10-CM

## 2019-06-09 DIAGNOSIS — G720 Drug-induced myopathy: Secondary | ICD-10-CM

## 2019-06-09 MED ORDER — METOPROLOL SUCCINATE ER 25 MG PO TB24: 12.5000 mg | ORAL_TABLET | Freq: Every day | ORAL | 1 refills | Status: DC

## 2019-06-09 NOTE — Patient Instructions (Signed)
Medication Instructions:   Your physician has recommended you make the following change in your medication:   1) Start Metoprolol Succinate 25 mg, 0.5 tablet by mouth once a day  *If you need a refill on your cardiac medications before your next appointment, please call your pharmacy*  Lab Work:  You will have labs drawn today: BMET/CBC/TSH  If you have labs (blood work) drawn today and your tests are completely normal, you will receive your results only by: Marland Kitchen MyChart Message (if you have MyChart) OR . A paper copy in the mail If you have any lab test that is abnormal or we need to change your treatment, we will call you to review the results.  Testing/Procedures:  None ordered today  Follow-Up: At [facility], you and your health needs are our priority.  As part of our continuing mission to provide you with exceptional heart care, we have created designated Provider Care Teams.  These Care Teams include your primary Cardiologist (physician) and Advanced Practice Providers (APPs -  Physician Assistants and Nurse Practitioners) who all work together to provide you with the care you need, when you need it.  We recommend signing up for the patient portal called "MyChart".  Sign up information is provided on this After Visit Summary.  MyChart is used to connect with patients for Virtual Visits (Telemedicine).  Patients are able to view lab/test results, encounter notes, upcoming appointments, etc.  Non-urgent messages can be sent to your provider as well.   To learn more about what you can do with MyChart, go to [email or web address].    Your next appointment:   6 month(s)  The format for your next appointment:   In Person  Provider:   You may see [name], MD or Richardson Dopp, PA-C

## 2019-06-10 LAB — CBC
Hematocrit: 40.2 % (ref 34.0–46.6)
Hemoglobin: 13.5 g/dL (ref 11.1–15.9)
MCH: 27.8 pg (ref 26.6–33.0)
MCHC: 33.6 g/dL (ref 31.5–35.7)
MCV: 83 fL (ref 79–97)
Platelets: 367 10*3/uL (ref 150–450)
RBC: 4.86 x10E6/uL (ref 3.77–5.28)
RDW: 15.1 % (ref 11.7–15.4)
WBC: 11 10*3/uL — ABNORMAL HIGH (ref 3.4–10.8)

## 2019-06-10 LAB — BASIC METABOLIC PANEL
BUN/Creatinine Ratio: 18 (ref 12–28)
BUN: 21 mg/dL (ref 8–27)
CO2: 31 mmol/L — ABNORMAL HIGH (ref 20–29)
Calcium: 10.8 mg/dL — ABNORMAL HIGH (ref 8.7–10.3)
Chloride: 95 mmol/L — ABNORMAL LOW (ref 96–106)
Creatinine, Ser: 1.17 mg/dL — ABNORMAL HIGH (ref 0.57–1.00)
GFR calc Af Amer: 57 mL/min/{1.73_m2} — ABNORMAL LOW (ref >59)
GFR calc non Af Amer: 49 mL/min/{1.73_m2} — ABNORMAL LOW (ref >59)
Glucose: 139 mg/dL — ABNORMAL HIGH (ref 65–99)
Potassium: 4.3 mmol/L (ref 3.5–5.2)
Sodium: 141 mmol/L (ref 134–144)

## 2019-06-10 LAB — TSH: TSH: 2.85 u[IU]/mL (ref 0.450–4.500)

## 2019-06-12 ENCOUNTER — Other Ambulatory Visit: Payer: Self-pay

## 2019-06-12 ENCOUNTER — Encounter: Payer: Self-pay | Admitting: Family Medicine

## 2019-06-12 ENCOUNTER — Ambulatory Visit (INDEPENDENT_AMBULATORY_CARE_PROVIDER_SITE_OTHER): Payer: Medicare HMO | Admitting: Family Medicine

## 2019-06-12 DIAGNOSIS — I5032 Chronic diastolic (congestive) heart failure: Secondary | ICD-10-CM

## 2019-06-12 DIAGNOSIS — M109 Gout, unspecified: Secondary | ICD-10-CM

## 2019-06-12 DIAGNOSIS — D72829 Elevated white blood cell count, unspecified: Secondary | ICD-10-CM | POA: Diagnosis not present

## 2019-06-12 DIAGNOSIS — G4733 Obstructive sleep apnea (adult) (pediatric): Secondary | ICD-10-CM

## 2019-06-12 DIAGNOSIS — Z7189 Other specified counseling: Secondary | ICD-10-CM | POA: Diagnosis not present

## 2019-06-12 DIAGNOSIS — E119 Type 2 diabetes mellitus without complications: Secondary | ICD-10-CM | POA: Diagnosis not present

## 2019-06-12 DIAGNOSIS — J449 Chronic obstructive pulmonary disease, unspecified: Secondary | ICD-10-CM

## 2019-06-12 DIAGNOSIS — N189 Chronic kidney disease, unspecified: Secondary | ICD-10-CM | POA: Diagnosis not present

## 2019-06-12 DIAGNOSIS — F329 Major depressive disorder, single episode, unspecified: Secondary | ICD-10-CM

## 2019-06-12 DIAGNOSIS — F32A Depression, unspecified: Secondary | ICD-10-CM

## 2019-06-12 DIAGNOSIS — R1031 Right lower quadrant pain: Secondary | ICD-10-CM | POA: Diagnosis not present

## 2019-06-12 DIAGNOSIS — M542 Cervicalgia: Secondary | ICD-10-CM

## 2019-06-12 MED ORDER — BUDESONIDE 0.5 MG/2ML IN SUSP: 2.0000 mL | Freq: Every day | RESPIRATORY_TRACT | 0 refills | Status: DC

## 2019-06-12 NOTE — Patient Instructions (Signed)
Let me get your old records and see about getting follow up labs.  If the right lower quadrant pain is worse, then let me know.  Take care.  Glad to see you.

## 2019-06-12 NOTE — Progress Notes (Signed)
This visit occurred during the SARS-CoV-2 public health emergency.  Safety protocols were in place, including screening questions prior to the visit, additional usage of staff PPE, and extensive cleaning of exam room while observing appropriate contact time as indicated for disinfecting solutions.  To est care.  Requesting outside records.    I sent short term rx for budesonide and she'll f/u with pulmonary, d/w pt.  She agrees.    On gabapentin and cymbalta for nerve pain.  This is long standing per patient report.  Requesting old records.    Still on cymbalta and wellbutrin at baseline.  No ADE on med.  Compliant.  Longstanding use per patient report.    CHF.  Followed by cards, in setting obesity and CKD and OSA.  Prev note from cardiology reviewed with prev echo noted.    On requip 45m in the AM and 685min the PM.  No ADE on med.  Compliant.  Longstanding use per patient report.  Requesting old records.    On CPAP at baseline.  Compliant, for OSA.    Her mother died last year in a nursing home in the mist of the pandemic.  D/w pt.  concolences offered.    WBC elevation noted on recent labs.  Longstanding, improved and nearly normal on recent check.  No fevers. HGB and PLT wnl.   Calcium elevation, mild elevation on most recent check.  Noted on prev labs over the last few months.  H/o CKD noted.  Prev PTH wnl.  Sig dairy intake and d/w pt about cutting back.  Unclear if that was causative.  Most recent vit D 44 and PTH 33 a few months ago.    DM2.  On insulin at baseline.  Compliant.  Will be due for labs, requesting old records.  Recent sugars >100.    H/o gout noted with flares in the last few months.    Advance directive d/w pt.  Son Ty designated if patient were incapacitated.    She has had some episodic RLQ pain w/o fevers.  No mass noted by patient. This isn't an acute issue.    PMH and SH reviewed  ROS: Per HPI unless specifically indicated in ROS section   Meds, vitals,  and allergies reviewed.   GEN: nad, alert and oriented HEENT: ncat NECK: supple w/o LA CV: rrr PULM: ctab, no inc wob ABD: soft, +bs EXT: no edema SKIN: well perfused.   RLQ ttp only on deep palpation w/o rebound.  abd not ttp o/w

## 2019-06-13 DIAGNOSIS — R69 Illness, unspecified: Secondary | ICD-10-CM | POA: Diagnosis not present

## 2019-06-14 ENCOUNTER — Telehealth: Payer: Self-pay | Admitting: Family Medicine

## 2019-06-14 ENCOUNTER — Encounter: Payer: Self-pay | Admitting: Family Medicine

## 2019-06-14 DIAGNOSIS — Z7189 Other specified counseling: Secondary | ICD-10-CM | POA: Insufficient documentation

## 2019-06-14 DIAGNOSIS — R1031 Right lower quadrant pain: Secondary | ICD-10-CM | POA: Insufficient documentation

## 2019-06-14 DIAGNOSIS — D72829 Elevated white blood cell count, unspecified: Secondary | ICD-10-CM | POA: Insufficient documentation

## 2019-06-14 DIAGNOSIS — M109 Gout, unspecified: Secondary | ICD-10-CM | POA: Insufficient documentation

## 2019-06-14 DIAGNOSIS — J4489 Other specified chronic obstructive pulmonary disease: Secondary | ICD-10-CM | POA: Insufficient documentation

## 2019-06-14 DIAGNOSIS — J449 Chronic obstructive pulmonary disease, unspecified: Secondary | ICD-10-CM | POA: Insufficient documentation

## 2019-06-14 NOTE — Assessment & Plan Note (Signed)
Will have patient return for labs.

## 2019-06-14 NOTE — Assessment & Plan Note (Signed)
Advance directive d/w pt.  Son Ty designated if patient were incapacitated.

## 2019-06-14 NOTE — Assessment & Plan Note (Signed)
Still on cymbalta and wellbutrin at baseline.  No ADE on med.  Compliant.  Longstanding use per patient report.  Would continue as is for now.

## 2019-06-14 NOTE — Assessment & Plan Note (Addendum)
Will have patient cut back on dairy and then recheck labs.   At least 45 minutes were devoted to patient care in this encounter (this can potentially include time spent reviewing the patient's file/history, interviewing and examining the patient, counseling/reviewing plan with patient, ordering referrals, ordering tests, reviewing relevant laboratory or x-ray data, and documenting the encounter).

## 2019-06-14 NOTE — Assessment & Plan Note (Signed)
Continue cpap.  

## 2019-06-14 NOTE — Assessment & Plan Note (Signed)
No change in metoprolol at this point.  Continue as is.  Appears euvolemic.

## 2019-06-14 NOTE — Assessment & Plan Note (Signed)
  I sent short term rx for budesonide and she'll f/u with pulmonary, d/w pt.  She agrees.

## 2019-06-14 NOTE — Telephone Encounter (Signed)
Call pt.  Still requesting old records.  Will review those when they come in.  Would cut out dairy and recheck labs in about 1 week, needs nonfasting lab visit.  If RLQ pain continues in spite of cutting out diary, then let me know.  Labs are in EMR.  Thanks.

## 2019-06-14 NOTE — Assessment & Plan Note (Addendum)
Episodic, not acute, if this continues then I want her to update me.  Still okay for outpatient f/u at this point.

## 2019-06-14 NOTE — Assessment & Plan Note (Signed)
See notes on f/u A1c.  No change in insulin/victoza at this point.

## 2019-06-14 NOTE — Assessment & Plan Note (Signed)
Recheck uric acid with next set of labs.  No sx currently.

## 2019-06-14 NOTE — Assessment & Plan Note (Signed)
On gabapentin and cymbalta for nerve pain.  This is long standing per patient report.  Requesting old records.

## 2019-06-15 ENCOUNTER — Telehealth: Payer: Self-pay | Admitting: Family Medicine

## 2019-06-15 DIAGNOSIS — J449 Chronic obstructive pulmonary disease, unspecified: Secondary | ICD-10-CM | POA: Diagnosis not present

## 2019-06-15 NOTE — Telephone Encounter (Signed)
Kathleen Hatfield with [facility] called today She is processing the medical record request she received from the office She stated that there would be 500+ pages of records if we wanted all of what was requested  Kathleen Hatfield stated she could put them on a disk but did not think we wanted the 500 pages.  Please advise  Kathleen Hatfield C/B # [phone] Ex [phone]

## 2019-06-15 NOTE — Telephone Encounter (Signed)
Patient advised.  Lab appt scheduled.

## 2019-06-16 NOTE — Telephone Encounter (Signed)
I would like any last CPE report, labs from last year, and last 2 other OV notes.  We'll start with that.  I thank them for the call.

## 2019-06-16 NOTE — Telephone Encounter (Signed)
Amber advised.  If further records are needed, Amber says she will be happy to put them on disc if we can work with that.

## 2019-06-20 DIAGNOSIS — G4733 Obstructive sleep apnea (adult) (pediatric): Secondary | ICD-10-CM | POA: Diagnosis not present

## 2019-06-22 ENCOUNTER — Other Ambulatory Visit (INDEPENDENT_AMBULATORY_CARE_PROVIDER_SITE_OTHER): Payer: Medicare HMO

## 2019-06-22 DIAGNOSIS — E119 Type 2 diabetes mellitus without complications: Secondary | ICD-10-CM | POA: Diagnosis not present

## 2019-06-22 DIAGNOSIS — N189 Chronic kidney disease, unspecified: Secondary | ICD-10-CM | POA: Diagnosis not present

## 2019-06-22 DIAGNOSIS — M109 Gout, unspecified: Secondary | ICD-10-CM

## 2019-06-22 DIAGNOSIS — D72829 Elevated white blood cell count, unspecified: Secondary | ICD-10-CM

## 2019-06-22 LAB — CBC WITH DIFFERENTIAL/PLATELET
Basophils Absolute: 0 10*3/uL (ref 0.0–0.1)
Basophils Relative: 0.4 % (ref 0.0–3.0)
Eosinophils Absolute: 0.2 10*3/uL (ref 0.0–0.7)
Eosinophils Relative: 1.6 % (ref 0.0–5.0)
HCT: 39.6 % (ref 36.0–46.0)
Hemoglobin: 13 g/dL (ref 12.0–15.0)
Lymphocytes Relative: 38.6 % (ref 12.0–46.0)
Lymphs Abs: 3.9 10*3/uL (ref 0.7–4.0)
MCHC: 32.8 g/dL (ref 30.0–36.0)
MCV: 85.2 fl (ref 78.0–100.0)
Monocytes Absolute: 0.8 10*3/uL (ref 0.1–1.0)
Monocytes Relative: 8.2 % (ref 3.0–12.0)
Neutro Abs: 5.1 10*3/uL (ref 1.4–7.7)
Neutrophils Relative %: 51.2 % (ref 43.0–77.0)
Platelets: 333 10*3/uL (ref 150.0–400.0)
RBC: 4.65 Mil/uL (ref 3.87–5.11)
RDW: 16.6 % — ABNORMAL HIGH (ref 11.5–15.5)
WBC: 10 10*3/uL (ref 4.0–10.5)

## 2019-06-22 LAB — COMPREHENSIVE METABOLIC PANEL
ALT: 16 U/L (ref 0–35)
AST: 17 U/L (ref 0–37)
Albumin: 4.1 g/dL (ref 3.5–5.2)
Alkaline Phosphatase: 86 U/L (ref 39–117)
BUN: 28 mg/dL — ABNORMAL HIGH (ref 6–23)
CO2: 37 mEq/L — ABNORMAL HIGH (ref 19–32)
Calcium: 9.9 mg/dL (ref 8.4–10.5)
Chloride: 96 mEq/L (ref 96–112)
Creatinine, Ser: 1.27 mg/dL — ABNORMAL HIGH (ref 0.40–1.20)
GFR: 42.31 mL/min — ABNORMAL LOW (ref >60.00)
Glucose, Bld: 141 mg/dL — ABNORMAL HIGH (ref 70–99)
Potassium: 3.7 mEq/L (ref 3.5–5.1)
Sodium: 141 mEq/L (ref 135–145)
Total Bilirubin: 0.5 mg/dL (ref 0.2–1.2)
Total Protein: 7.6 g/dL (ref 6.0–8.3)

## 2019-06-22 LAB — VITAMIN D 25 HYDROXY (VIT D DEFICIENCY, FRACTURES): VITD: 47.6 ng/mL (ref 30.00–100.00)

## 2019-06-22 LAB — HEMOGLOBIN A1C: Hgb A1c MFr Bld: 8.7 % — ABNORMAL HIGH (ref 4.6–6.5)

## 2019-06-22 LAB — URIC ACID: Uric Acid, Serum: 9.9 mg/dL — ABNORMAL HIGH (ref 2.4–7.0)

## 2019-06-23 LAB — PTH, INTACT AND CALCIUM
Calcium: 9.9 mg/dL (ref 8.6–10.4)
PTH: 64 pg/mL (ref 14–64)

## 2019-06-23 LAB — PATHOLOGIST SMEAR REVIEW

## 2019-06-30 ENCOUNTER — Encounter: Payer: Self-pay | Admitting: Family Medicine

## 2019-07-05 ENCOUNTER — Other Ambulatory Visit: Payer: Self-pay | Admitting: Family Medicine

## 2019-07-06 NOTE — Telephone Encounter (Signed)
Sent. Thanks.   

## 2019-07-06 NOTE — Telephone Encounter (Signed)
Electronic refill request. Gabapentin Last office visit:   06/12/2019 Last Filled:   #90  1 RF  09/03/2017 Please advise.

## 2019-07-12 ENCOUNTER — Other Ambulatory Visit: Payer: Self-pay | Admitting: Family Medicine

## 2019-07-12 NOTE — Telephone Encounter (Signed)
Please verify with patient.  I thought she was taking 1 pill in the morning and 1.5 pills at night.  We talked about that dosage at her visit to establish care here at the clinic and I see the medication listed at that dosage in her old charts, but I do not see an associated diagnosis.  Please verify that with the patient.  This is a higher than typical dose but if this was the only thing that was effective for the patient and if she has tolerated it in the meantime then it may make sense to continue.  Thanks.

## 2019-07-12 NOTE — Telephone Encounter (Signed)
Last office visit 06/12/2019 for establish care.  See comments on refill request.  Okay to refill?

## 2019-07-13 ENCOUNTER — Encounter: Payer: Self-pay | Admitting: Family Medicine

## 2019-07-13 LAB — LAB REPORT - SCANNED
A1c: 10.1
A1c: 7.8
A1c: 7.8
A1c: 8.9
ALT: 21 (ref 3–30)
AST: 18
Calcium: 10.2
Calcium: 10.3
Calcium: 10.3
Cholesterol: 333
Creatinine, Ser: 1
Creatinine, Ser: 1.3
Glucose: 181
Glucose: 202
HDL: 38
Hemoglobin: 13
Potassium: 3.5
Triglycerides: 469 — AB (ref 40–160)
Uric Acid: 11.3
Vit D, 25-Hydroxy: 78

## 2019-07-13 NOTE — Telephone Encounter (Signed)
Patient is taking this medication:  1 tab in the a.m. and 1.5 tabs in the p.m.  Please refill accordingly.

## 2019-07-14 NOTE — Telephone Encounter (Signed)
Sent. Thanks.   

## 2019-07-15 DIAGNOSIS — J449 Chronic obstructive pulmonary disease, unspecified: Secondary | ICD-10-CM | POA: Diagnosis not present

## 2019-07-20 DIAGNOSIS — G4733 Obstructive sleep apnea (adult) (pediatric): Secondary | ICD-10-CM | POA: Diagnosis not present

## 2019-07-21 ENCOUNTER — Other Ambulatory Visit (HOSPITAL_COMMUNITY)
Admission: RE | Admit: 2019-07-21 | Discharge: 2019-07-21 | Disposition: A | Discharge status: Home / Self Care | Payer: Medicare HMO | Source: Ambulatory Visit | Attending: Pulmonary Disease | Admitting: Pulmonary Disease

## 2019-07-21 DIAGNOSIS — Z01812 Encounter for preprocedural laboratory examination: Secondary | ICD-10-CM | POA: Diagnosis not present

## 2019-07-21 DIAGNOSIS — Z20822 Contact with and (suspected) exposure to covid-19: Secondary | ICD-10-CM | POA: Diagnosis not present

## 2019-07-21 LAB — SARS CORONAVIRUS 2 (TAT 6-24 HRS): SARS Coronavirus 2: NEGATIVE

## 2019-07-24 ENCOUNTER — Encounter: Payer: Self-pay | Admitting: Pulmonary Disease

## 2019-07-24 ENCOUNTER — Ambulatory Visit: Payer: Medicare HMO | Admitting: Pulmonary Disease

## 2019-07-24 ENCOUNTER — Ambulatory Visit (INDEPENDENT_AMBULATORY_CARE_PROVIDER_SITE_OTHER): Payer: Medicare HMO | Admitting: Pulmonary Disease

## 2019-07-24 ENCOUNTER — Other Ambulatory Visit: Payer: Self-pay

## 2019-07-24 VITALS — BP 116/76 | HR 77 | Temp 97.3°F | Ht 65.0 in | Wt 262.0 lb

## 2019-07-24 DIAGNOSIS — Z9989 Dependence on other enabling machines and devices: Secondary | ICD-10-CM | POA: Diagnosis not present

## 2019-07-24 DIAGNOSIS — J449 Chronic obstructive pulmonary disease, unspecified: Secondary | ICD-10-CM | POA: Diagnosis not present

## 2019-07-24 DIAGNOSIS — G4733 Obstructive sleep apnea (adult) (pediatric): Secondary | ICD-10-CM | POA: Diagnosis not present

## 2019-07-24 LAB — PULMONARY FUNCTION TEST
DL/VA % pred: 149 %
DL/VA: 6.2 ml/min/mmHg/L
DLCO cor % pred: 88 %
DLCO cor: 18.17 ml/min/mmHg
DLCO unc % pred: 87 %
DLCO unc: 17.94 ml/min/mmHg
FEF 25-75 Post: 0.73 L/sec
FEF 25-75 Pre: 0.38 L/sec
FEF2575-%Change-Post: 93 %
FEF2575-%Pred-Post: 32 %
FEF2575-%Pred-Pre: 16 %
FEV1-%Change-Post: 20 %
FEV1-%Pred-Post: 42 %
FEV1-%Pred-Pre: 35 %
FEV1-Post: 1.09 L
FEV1-Pre: 0.91 L
FEV1FVC-%Change-Post: 13 %
FEV1FVC-%Pred-Pre: 76 %
FEV6-%Change-Post: 7 %
FEV6-%Pred-Post: 50 %
FEV6-%Pred-Pre: 46 %
FEV6-Post: 1.6 L
FEV6-Pre: 1.49 L
FEV6FVC-%Change-Post: 1 %
FEV6FVC-%Pred-Post: 102 %
FEV6FVC-%Pred-Pre: 100 %
FVC-%Change-Post: 5 %
FVC-%Pred-Post: 48 %
FVC-%Pred-Pre: 46 %
FVC-Post: 1.62 L
FVC-Pre: 1.53 L
Post FEV1/FVC ratio: 67 %
Post FEV6/FVC ratio: 99 %
Pre FEV1/FVC ratio: 59 %
Pre FEV6/FVC Ratio: 97 %
RV % pred: 153 %
RV: 3.25 L
TLC % pred: 93 %
TLC: 4.85 L

## 2019-07-24 NOTE — Progress Notes (Signed)
[facility]  Chief Complaint  Patient presents with  . Follow-up    Constitutional:  BP 116/76 (BP Location: Right Arm, Cuff Size: Large)   Pulse 77   Temp (!) 97.3 F (36.3 C) (Temporal)   Ht 5' 5"  (1.651 m)   Wt 262 lb (118.8 kg)   SpO2 93% Comment: RA  BMI 43.60 kg/m   Past Medical History:  Ulcerative colitis, Pneumonia, Neuropathy, HTN, HLD, GERD, Diverticulitis, DM, Depression, CKD 3a, Diastolic CHF, OA, Anemia  Brief Summary:  Kathleen Hatfield is a 64 y.o. female with COPD/asthma and obstructive sleep apnea.  Subjective:  She continues to have dyspnea.  She is getting more cough, wheeze, and chest congestion.  She isn't sure which nebulizer medications she has been using.  She thinks she has been using perforomist.  She forgot she had budesonide.  She has been using CPAP at night.  She wasn't sure if she needed a data card.  PFT today shows moderate obstruction, positive bronchodilator response, air trapping, and normal diffusion capacity.  She got J&J COVID vaccine.  She liked that she only needed one shot.  Physical Exam:   Appearance - well kempt   ENMT - no sinus tenderness, no oral exudate, no LAN, Mallampati 3 airway, no stridor  Respiratory - equal breath sounds bilaterally, no wheezing or rales  CV - s1s2 regular rate and rhythm, no murmurs  Ext - no clubbing, no edema  Skin - no rashes  Psych - normal mood and affect   Assessment/Plan:   COPD with asthma. - difficult to determine whether her disease is progressing or whether her symptoms are related to insufficient adherence to medications - reviewed her nebulizer medication list with her and detailed when she should be taking each. - resume budesonide and perforomist bid - duoneb every 4 to 6 hours as needed - she will call if her symptoms don't improve with resumption of regular nebulizer regimen; at that point will need to consider adding a LAMA and also  assessing for biologic agent  Obstructive sleep apnea. - she reports compliance with CPAP and benefit from therapy - continue CPAP 13 cm H2O - will call her with CPAP download  Chronic diastolic CHF. - followed by cardiology  Obesity with deconditioning. - encouraged her to maintain a regular exercise regimen  A total of 33 minutes spent addressing patient care issues on day of visit.   Follow up:   Patient Instructions  Budesonide (pulmicort) one vial nebulized in the morning and one vial nebulized in the evening.  Rinse your mouth after each use.  Perforomist one vial nebulized in the morning and one vial nebulized in the evening.  Duoneb one vial nebulized every 4 to 6 hours as needed for coughing, wheeze, chest congestion, or shortness of breath.  Will arrange for new nebulizer mask.  Will get copy of your CPAP download.  Follow up in 6 months.   Signature:  [name], MD [facility] Pager: [phone] 07/24/2019, 11:45 AM  Flow Sheet     Pulmonary tests:   PFT 07/02/15 >> FEV1 1.50 (56%), FEV1% 69, TLC 4.99 (96%), DLCO 60%  PFT 07/24/19 >> FEV1 1.09 (42%), FEV1% 67, TLC 4.85 (93%), DLCO 87%, +BD  Chest imaging:   V/Q 04/05/16 >> normal  Sleep tests:   PSG 09/01/12 >> AHI 36.1  ONO with CPAP 03/14/17 >>test time 10 hrs 34 min. Basal SpO2 93%, low SpO2 88%.  CPAP 08/07/18  to 09/05/18 >> used on 29 of 30 nights with average 5 hrs 28 min.  Average AHI 0.1 with CPAP 13 cm H2O  Cardiac tests:   Echo 05/18/19 >> EF 70 %, mild LVH, grade 1 DD, aortic root 39 mm  Medications:   Allergies as of 07/24/2019      Reactions   Almond Oil Anaphylaxis   Morphine And Related Shortness Of Breath, Other (See Comments)   Pt. States while in the hospital it affected her breathing, O2 dropped to the 70's   Atorvastatin Other (See Comments)   Leg weakness   Ceclor [cefaclor] Nausea And Vomiting   Ciprofloxacin Other (See Comments)   Makes joints  and muscles ache   Levaquin [levofloxacin] Other (See Comments)   Body aches   Losartan Other (See Comments)   Weakness   Statins    Leg and body weakness   Sulfa Antibiotics Nausea And Vomiting      Medication List       Accurate as of Jul 24, 2019 11:45 AM. If you have any questions, ask your nurse or doctor.        acetaminophen 500 MG tablet Commonly known as: TYLENOL Take 1,000 mg by mouth every 6 (six) hours as needed for pain.   albuterol 108 (90 Base) MCG/ACT inhaler Commonly known as: VENTOLIN HFA Inhale 2 puffs into the lungs every 4 (four) hours as needed for wheezing or shortness of breath.   Bayer Contour Next Test test strip Generic drug: glucose blood 1 each by Other route daily. As directed   budesonide 0.5 MG/2ML nebulizer solution Commonly known as: PULMICORT Take 2 mLs (0.5 mg total) by nebulization daily.   buPROPion 300 MG 24 hr tablet Commonly known as: WELLBUTRIN XL Take 1 tablet by mouth daily.   DULoxetine 60 MG capsule Commonly known as: CYMBALTA TAKE 1 CAPSULE BY MOUTH ONCE DAILY.   furosemide 40 MG tablet Commonly known as: LASIX Take 60 mg by mouth daily.   gabapentin 100 MG capsule Commonly known as: NEURONTIN TAKE 3 CAPSULES BY MOUTH AT BEDTIME.   Global Ease Inject Pen Needles 32G X 4 MM Misc Generic drug: Insulin Pen Needle 1 each by Other route daily.   ipratropium-albuterol 0.5-2.5 (3) MG/3ML Soln Commonly known as: DUONEB Take 3 mLs by nebulization every 6 (six) hours.   metolazone 5 MG tablet Commonly known as: ZAROXOLYN Take 2.5 mg by mouth daily as needed (Take with Lasix when weight is elevated 3 pounds overnight and 5 pounds or more for 2 days).   metoprolol succinate 25 MG 24 hr tablet Commonly known as: TOPROL-XL Take 0.5 tablets (12.5 mg total) by mouth daily.   Microlet Lancets Misc 1 each by Other route. As directed   ondansetron 4 MG disintegrating tablet Commonly known as: Zofran ODT Take 1 tablet (4  mg total) by mouth every 8 (eight) hours as needed for nausea or vomiting.   pantoprazole 40 MG tablet Commonly known as: PROTONIX Take 40 mg by mouth daily.   Perforomist 20 MCG/2ML nebulizer solution Generic drug: formoterol Take 2 mLs (20 mcg total) by nebulization 2 (two) times daily.   polyethylene glycol 17 g packet Commonly known as: MIRALAX / GLYCOLAX Take 17 g by mouth daily as needed for constipation.   potassium chloride 10 MEQ tablet Commonly known as: KLOR-CON Take 40 mEq by mouth 2 (two) times daily.   rOPINIRole 4 MG tablet Commonly known as: REQUIP 1 tab in the a.m. and 1.5  tabs in the p.m.   spironolactone 25 MG tablet Commonly known as: ALDACTONE Take 0.5 tablets (12.5 mg total) by mouth daily.   traMADol 50 MG tablet Commonly known as: ULTRAM Take 50 mg by mouth daily as needed for pain.   Tyler Aas FlexTouch 200 UNIT/ML FlexTouch Pen Generic drug: insulin degludec Inject 84 Units into the skin daily.   UNABLE TO FIND CPAP at bedtime   Victoza 18 MG/3ML Sopn Generic drug: liraglutide Inject 1.8 mg into the skin daily with lunch.   Vitamin D-3 125 MCG (5000 UT) Tabs Take 5,000 Units by mouth every other day.       Past Surgical History:  She  has a past surgical history that includes Abdominal hysterectomy; Cholecystectomy; Cesarean section; Appendectomy; Sigmoidectomy (2010); right heart catheterization (N/A, 02/27/2013); right heart catheterization (N/A, 12/14/2013); Cardiac catheterization; Breast surgery; Hernia repair; Ventral hernia repair (N/A, 11/15/2015); Laparoscopic lysis of adhesions (N/A, 11/15/2015); Insertion of mesh (N/A, 11/15/2015); and Breast biopsy.  Family History:  Her family history includes COPD in her father; Dementia in her mother; Heart disease in her father and mother; Hypertension in her father and mother.  Social History:  She  reports that she has never smoked. She has never used smokeless tobacco. She reports that she does  not drink alcohol or use drugs.

## 2019-07-24 NOTE — Progress Notes (Signed)
Full PFT performed today. °

## 2019-07-24 NOTE — Patient Instructions (Addendum)
Budesonide (pulmicort) one vial nebulized in the morning and one vial nebulized in the evening.  Rinse your mouth after each use.  Perforomist one vial nebulized in the morning and one vial nebulized in the evening.  Duoneb one vial nebulized every 4 to 6 hours as needed for coughing, wheeze, chest congestion, or shortness of breath.  Will arrange for new nebulizer mask.  Will get copy of your CPAP download.  Follow up in 6 months.

## 2019-08-02 ENCOUNTER — Other Ambulatory Visit: Payer: Self-pay | Admitting: Family Medicine

## 2019-08-02 NOTE — Telephone Encounter (Signed)
Sent. Thanks.   

## 2019-08-02 NOTE — Telephone Encounter (Signed)
Last office visit See note on request from pharmacy, medication prescribed previously by another doctor.

## 2019-08-03 DIAGNOSIS — G4733 Obstructive sleep apnea (adult) (pediatric): Secondary | ICD-10-CM | POA: Diagnosis not present

## 2019-08-08 ENCOUNTER — Telehealth: Payer: Self-pay | Admitting: Pulmonary Disease

## 2019-08-08 MED ORDER — PERFOROMIST 20 MCG/2ML IN NEBU: 20.0000 ug | INHALATION_SOLUTION | Freq: Two times a day (BID) | RESPIRATORY_TRACT | 5 refills | Status: DC

## 2019-08-08 NOTE — Telephone Encounter (Signed)
Refill for Perforomist sent to pharmacy of choice.

## 2019-08-11 ENCOUNTER — Telehealth: Payer: Self-pay | Admitting: Pulmonary Disease

## 2019-08-11 NOTE — Telephone Encounter (Signed)
She can stop using perforomist.  She should continue budesonide.  She can use duoneb more frequently - every 4 to 6 hours.

## 2019-08-11 NOTE — Telephone Encounter (Signed)
Patient reporting that Perforomist is more expensive than she can afford. Requesting an alternative. Last OV 07/24/2019. Please advise

## 2019-08-14 NOTE — Telephone Encounter (Signed)
Spoke with pt and advised of Dr Juanetta Gosling recommendations. PT verbalized understanding. Nothing further needed at this time.

## 2019-08-15 DIAGNOSIS — J449 Chronic obstructive pulmonary disease, unspecified: Secondary | ICD-10-CM | POA: Diagnosis not present

## 2019-08-20 DIAGNOSIS — G4733 Obstructive sleep apnea (adult) (pediatric): Secondary | ICD-10-CM | POA: Diagnosis not present

## 2019-08-24 ENCOUNTER — Other Ambulatory Visit: Payer: Self-pay | Admitting: Family Medicine

## 2019-08-24 NOTE — Telephone Encounter (Signed)
Electronic refill request. Victoza Last office visit:   06/12/2019 Last Filled:   Last authorized by Dr. Bertram Millard Please advise.

## 2019-08-25 NOTE — Telephone Encounter (Signed)
Sent. Thanks.   

## 2019-08-30 ENCOUNTER — Other Ambulatory Visit: Payer: Self-pay | Admitting: Family Medicine

## 2019-09-04 ENCOUNTER — Ambulatory Visit: Payer: Medicare HMO | Admitting: Family Medicine

## 2019-09-04 ENCOUNTER — Encounter: Payer: Self-pay | Admitting: Family Medicine

## 2019-09-04 ENCOUNTER — Ambulatory Visit (INDEPENDENT_AMBULATORY_CARE_PROVIDER_SITE_OTHER): Payer: Medicare HMO | Admitting: Family Medicine

## 2019-09-04 ENCOUNTER — Other Ambulatory Visit: Payer: Self-pay

## 2019-09-04 VITALS — BP 126/72 | HR 84 | Temp 95.9°F | Ht 65.0 in | Wt 265.0 lb

## 2019-09-04 DIAGNOSIS — E119 Type 2 diabetes mellitus without complications: Secondary | ICD-10-CM

## 2019-09-04 DIAGNOSIS — J449 Chronic obstructive pulmonary disease, unspecified: Secondary | ICD-10-CM

## 2019-09-04 DIAGNOSIS — R251 Tremor, unspecified: Secondary | ICD-10-CM

## 2019-09-04 DIAGNOSIS — N182 Chronic kidney disease, stage 2 (mild): Secondary | ICD-10-CM

## 2019-09-04 DIAGNOSIS — N1831 Chronic kidney disease, stage 3a: Secondary | ICD-10-CM | POA: Diagnosis not present

## 2019-09-04 MED ORDER — TRESIBA FLEXTOUCH 200 UNIT/ML ~~LOC~~ SOPN: 88.0000 [IU] | PEN_INJECTOR | Freq: Every day | SUBCUTANEOUS | Status: DC

## 2019-09-04 NOTE — Patient Instructions (Addendum)
Call about an eye exam when possible.   I'll look for a copy of the labs from the kidney clinic.   Don't change your meds for now but let me contact the pulmonary clinic.    We'll see about jittery sensation after I see your labs and talk to pulmonary.  We may need to have you talk to neurology.    We can recheck your A1c at a visit in about 1 month or so.  Take care.  Glad to see you.

## 2019-09-04 NOTE — Progress Notes (Signed)
This visit occurred during the SARS-CoV-2 public health emergency.  Safety protocols were in place, including screening questions prior to the visit, additional usage of staff PPE, and extensive cleaning of exam room while observing appropriate contact time as indicated for disinfecting solutions.  Taking lasix 29m a day.  Taking metolazone about 1-2 times a week.  She is going to have labs done at the renal clinic today.  She is still on pulmicort at baseline.  She feel shaky after using duoneb more frequently and doesn't last as long as needed.  She couldn't get perforomist covered, I'll update pulmonary/Dr. SHalford Chessmanfor input.    She is jittery at baseline.  She has arm and leg movement when asleep, others have noted.  Her handwriting is altered from tremor.   Diabetes:  Using medications without difficulties: yes Hypoglycemic episodes:no Hyperglycemic episodes: 200s but not 300s.   Feet problems: altered sensation.  No blisters or ulcers.  Some numbness.    Blood Sugars averaging: 150-170 eye exam within last year: d/w pt.  Due for f/u.    She had prev HCV screening in the distant past.    PMH and SH reviewed Meds, vitals, and allergies reviewed.   ROS: Per HPI unless specifically indicated in ROS section   GEN: nad, alert and oriented HEENT: NCAT NECK: supple w/o LA CV: rrr. PULM: ctab, no inc wob ABD: soft, +bs EXT: no edema SKIN: no acute rash Right greater than left hand tremor noted.  Diabetic foot exam: Normal inspection No skin breakdown No calluses  Normal DP pulses Altered sensation to light touch and monofilament on plantar side of foot.  Nails normal  Labs done and available via care everywhere.   Hemoglobin 13.2. WBC 9.3 Platelets 317  Sodium 137 Potassium 3.6 Chloride 89 CO2 38 BUN 30 Glucose 246 Calcium 10.1 Phosphorus 3.2 Albumin 3.9 Creatinine 1.16.

## 2019-09-06 ENCOUNTER — Telehealth: Payer: Self-pay | Admitting: Family Medicine

## 2019-09-06 NOTE — Assessment & Plan Note (Signed)
She is still on pulmicort at baseline.  She feel shaky after using duoneb more frequently and doesn't last as long as needed.  She couldn't get perforomist covered, I'll update pulmonary/Dr. Halford Chessman for input.

## 2019-09-06 NOTE — Assessment & Plan Note (Signed)
Continue Tyler Aas and Victoza as is for now.  Plan on recheck an A1c in 1 month.

## 2019-09-06 NOTE — Telephone Encounter (Signed)
Please update patient.  I think it makes sense to see Dr. Carles Collet about her tremor.  I put in the referral.  Thanks.

## 2019-09-06 NOTE — Assessment & Plan Note (Signed)
I will defer to renal clinic.  No change in medication at this point.

## 2019-09-07 ENCOUNTER — Encounter: Payer: Self-pay | Admitting: Neurology

## 2019-09-07 LAB — LAB REPORT - SCANNED
Albumin: 3.9
BUN: 30 — AB (ref 4–21)
Calcium: 10.1
Carbon Dioxide, Total: 38
Chloride: 89
Creatinine, Ser: 1.16
Glucose: 246
Hemoglobin: 13.2
Phosphorus: 3.2
Platelets: 317
Potassium: 3.6
Sodium: 137
WBC: 9.3

## 2019-09-08 DIAGNOSIS — N1831 Chronic kidney disease, stage 3a: Secondary | ICD-10-CM | POA: Diagnosis not present

## 2019-09-08 DIAGNOSIS — I129 Hypertensive chronic kidney disease with stage 1 through stage 4 chronic kidney disease, or unspecified chronic kidney disease: Secondary | ICD-10-CM | POA: Diagnosis not present

## 2019-09-08 DIAGNOSIS — N183 Chronic kidney disease, stage 3 unspecified: Secondary | ICD-10-CM | POA: Diagnosis not present

## 2019-09-08 DIAGNOSIS — Z794 Long term (current) use of insulin: Secondary | ICD-10-CM | POA: Diagnosis not present

## 2019-09-08 DIAGNOSIS — E1122 Type 2 diabetes mellitus with diabetic chronic kidney disease: Secondary | ICD-10-CM | POA: Diagnosis not present

## 2019-09-08 DIAGNOSIS — Z6841 Body Mass Index (BMI) 40.0 and over, adult: Secondary | ICD-10-CM | POA: Diagnosis not present

## 2019-09-14 DIAGNOSIS — J449 Chronic obstructive pulmonary disease, unspecified: Secondary | ICD-10-CM | POA: Diagnosis not present

## 2019-09-14 NOTE — Progress Notes (Signed)
Assessment/Plan:   1.  Tremor.  -While patient may have essential tremor, I discussed with the patient that this diagnosis cannot be made while 1 is on tremor inducing medications.  She is on several of these for her COPD.  There are also qualities c/w FND. Discussed this.  Discussed weighted gloves.  Discussed that there are no specific tremor medications, and she is already on a beta-blocker, although at small dosages.  Switching from metoprolol to propranolol could be of benefit if able to do that with her asthma.  Will ask primary care if he thinks that is reasonable since he is the prescribing physician.  2.  Restless leg  -Patient is on fairly high dose of ropinirole for restless leg (she is on a dose that is more appropriate for Parkinson's disease).  Suspect that she is getting augmentation from this medication.  -will check iron/ferritin.  She is eating ice in the room and wonder if low iron contributing factor.  States that she is always craving ice.  Last hgb was normal but states that she has a history of anemia.  Ferritin has not been checked.  3.  F/u prn depending on above   Subjective:   Kathleen Hatfield was seen in consultation in the movement disorder clinic at the request of [name], MD.  The evaluation is for tremor.  This patient is accompanied in the office by her boyfriend who supplements the history.   Tremor started approximately 1 year ago, worse over the last 6 months months, and involves the mostly the hands but sometimes the legs as well.  "I jerk all over.  i'm freezing but i'm not cold."  States that she has tremor all of the time but has episodes of "spells" where she will shake all over that will put her to bed for 24 hours.    Tremor is most noticeable when holding something, typing, trying to relax.  The R hand is worse than the L but both hands do it.   There is no family hx of tremor.    Affected by caffeine:  No. (doesn't drink much) Affected by alcohol:   Doesn't drink any Affected by stress:  Doesn't know  Affected by fatigue:  No. Spills soup if on spoon:  Not most of the time Affects ADL's (tying shoes, brushing teeth, etc):  No., more trouble with fine motor coordination and using mouse on computer  Current/Previously tried tremor medications: on small dose metoprolol, 12.5 mg daily; on requip 4 mg, 1 in the AM'; 1.5 in the evening (started on it 10 years ago but needed the extra in the 1/2 tab)  Current medications that may exacerbate tremor:  pulmicort; proventil (pt states that it generates "some shake but its a different shake) - states that these cause a tense up type of shake and the other tremor is "when I try to relax"  Outside reports reviewed: historical medical records, lab reports, office notes and referral letter/letters.  Allergies  Allergen Reactions  . Almond Oil Anaphylaxis  . Morphine And Related Shortness Of Breath and Other (See Comments)    Pt. States while in the hospital it affected her breathing, O2 dropped to the 70's  . Atorvastatin Other (See Comments)    Leg weakness  . Ceclor [Cefaclor] Nausea And Vomiting  . Ciprofloxacin Other (See Comments)    Makes joints and muscles ache  . Levaquin [Levofloxacin] Other (See Comments)    Body aches  . Losartan Other (  See Comments)    Weakness   . Statins     Leg and body weakness  . Sulfa Antibiotics Nausea And Vomiting    Current Outpatient Medications  Medication Instructions  . acetaminophen (TYLENOL) 1,000 mg, Oral, Every 6 hours PRN  . albuterol (PROVENTIL HFA;VENTOLIN HFA) 108 (90 Base) MCG/ACT inhaler 2 puffs, Inhalation, Every 4 hours PRN  . BAYER CONTOUR NEXT TEST test strip 1 each, Other, Daily, As directed  . budesonide (PULMICORT) 0.5 mg, Nebulization, Daily  . buPROPion (WELLBUTRIN XL) 300 MG 24 hr tablet 1 tablet, Oral, Daily  . DULoxetine (CYMBALTA) 60 MG capsule TAKE 1 CAPSULE BY MOUTH ONCE DAILY.  . furosemide (LASIX) 40 MG tablet TAKE 1 &  1/2 TABLETS BY MOUTH DAILY.  Marland Kitchen gabapentin (NEURONTIN) 100 MG capsule TAKE 3 CAPSULES BY MOUTH AT BEDTIME.  Marland Kitchen GLOBAL EASE INJECT PEN NEEDLES 32G X 4 MM MISC 1 each, Other, Daily  . ipratropium-albuterol (DUONEB) 0.5-2.5 (3) MG/3ML SOLN 3 mLs, Nebulization, Every 6 hours  . metolazone (ZAROXOLYN) 2.5 mg, Oral, Daily PRN  . metoprolol succinate (TOPROL-XL) 12.5 mg, Oral, Daily  . MICROLET LANCETS MISC 1 each, Other, As directed  . ondansetron (ZOFRAN ODT) 4 mg, Oral, Every 8 hours PRN  . pantoprazole (PROTONIX) 40 mg, Daily  . polyethylene glycol (MIRALAX / GLYCOLAX) 17 g, Oral, Daily PRN  . potassium chloride (MICRO-K) 10 MEQ CR capsule TAKE 4 CAPSULES BY MOUTH TWICE A DAY.  Marland Kitchen rOPINIRole (REQUIP) 4 MG tablet 1 tab in the a.m. and 1.5 tabs in the p.m.  Marland Kitchen spironolactone (ALDACTONE) 12.5 mg, Oral, Daily  . traMADol (ULTRAM) 50 mg, Oral, Daily PRN  . Tresiba FlexTouch 88 Units, Subcutaneous, Daily  . UNABLE TO FIND CPAP at bedtime  . VICTOZA 18 MG/3ML SOPN INJECT 1.8MG UNDER THE SKIN DAILY.  Marland Kitchen Vitamin D-3 5,000 Units, Oral, Every other day     Objective:   VITALS:   Vitals:   09/19/19 0939  BP: (!) 145/83  Pulse: 82  SpO2: 94%  Weight: 262 lb (118.8 kg)  Height: 5' 5"  (1.651 m)   Gen:  Appears stated age and in NAD. HEENT:  Normocephalic, atraumatic. The mucous membranes are moist. The superficial temporal arteries are without ropiness or tenderness. Cardiovascular: Regular rate and rhythm. Lungs: Clear to auscultation bilaterally. Neck: There are no carotid bruits noted bilaterally.  NEUROLOGICAL:  Orientation:  The patient is alert and oriented x 3.   Cranial nerves: There is good facial symmetry. Extraocular muscles are intact and visual fields are full to confrontational testing. Speech is fluent and clear. Soft palate rises symmetrically and there is no tongue deviation. Hearing is intact to conversational tone. Tone: Tone is good throughout. Sensation: Sensation is intact  to light touch touch throughout (facial, trunk, extremities). Vibration is intact at the bilateral big toe. There is no extinction with double simultaneous stimulation. There is no sensory dermatomal level identified. Coordination:  The patient has no dysdiadichokinesia or dysmetria. Motor: Strength is 5/5 in the bilateral upper and lower extremities.  Shoulder shrug is equal bilaterally.  There is no pronator drift.  There are no fasciculations noted. DTR's: Deep tendon reflexes are 2/4 at the bilateral biceps, triceps, brachioradialis, patella and achilles.  Plantar responses are downgoing bilaterally. Gait and Station: The patient initially is unstable when she first gets up.  She is somewhat antalgic because of leg/knee pain and also has somewhat of an astasia-abasia quality.  She does better the longer she ambulates.  MOVEMENT  EXAM: Tremor:  Tremor varies in amplitude and frequency on the right.  When asked to tap out a rhythm with one hand, the frequency of the tremor in the other hand matches the rhythm asked to be tapped out.  Tremor is distractible.  No rest tremor.  No significant tremor when pouring water from 1 glass to another.  She draws Archimedes spirals fairly well.  I have reviewed and interpreted the following labs independently   Chemistry      Component Value Date/Time   NA 137 09/04/2019 0000   K 3.6 09/04/2019 0000   CL 89 09/04/2019 0000   CO2 38 09/04/2019 0000   BUN 30 (A) 09/04/2019 0000   CREATININE 1.16 09/04/2019 0000      Component Value Date/Time   CALCIUM 10.1 09/04/2019 0000   ALKPHOS 86 06/22/2019 1359   AST 17 06/22/2019 1359   AST 18 04/26/2019 0000   ALT 16 06/22/2019 1359   ALT 21 04/26/2019 0000   BILITOT 0.5 06/22/2019 1359      Lab Results  Component Value Date   WBC 9.3 09/04/2019   HGB 13.2 09/04/2019   HCT 39.6 06/22/2019   MCV 85.2 06/22/2019   PLT 317 09/04/2019   Lab Results  Component Value Date   TSH 2.850 06/09/2019   No  results found for: FERRITIN No results found for: IRON, TIBC, FERRITIN    Total time spent on today's visit was 45 minutes, including both face-to-face time and nonface-to-face time.  Time included that spent on review of records (prior notes available to me/labs/imaging if pertinent), discussing treatment and goals, answering patient's questions and coordinating care.  CC:  [name], MD

## 2019-09-19 ENCOUNTER — Encounter: Payer: Self-pay | Admitting: Neurology

## 2019-09-19 ENCOUNTER — Other Ambulatory Visit: Payer: Self-pay

## 2019-09-19 ENCOUNTER — Other Ambulatory Visit (INDEPENDENT_AMBULATORY_CARE_PROVIDER_SITE_OTHER): Payer: Medicare HMO

## 2019-09-19 ENCOUNTER — Ambulatory Visit: Payer: Medicare HMO | Admitting: Neurology

## 2019-09-19 VITALS — BP 145/83 | HR 82 | Ht 65.0 in | Wt 262.0 lb

## 2019-09-19 DIAGNOSIS — Z862 Personal history of diseases of the blood and blood-forming organs and certain disorders involving the immune mechanism: Secondary | ICD-10-CM

## 2019-09-19 DIAGNOSIS — R251 Tremor, unspecified: Secondary | ICD-10-CM | POA: Diagnosis not present

## 2019-09-19 DIAGNOSIS — G2581 Restless legs syndrome: Secondary | ICD-10-CM | POA: Diagnosis not present

## 2019-09-19 NOTE — Addendum Note (Signed)
Addended by: Ulice Brilliant T on: 09/19/2019 12:01 PM   Modules accepted: Orders

## 2019-09-19 NOTE — Patient Instructions (Signed)
Your provider has requested that you have labwork completed today. Please go to [facility] ([id number]) on the second floor of this building before leaving the office today. You do not need to check in. If you are not called within 15 minutes please check with the front desk.   

## 2019-09-20 ENCOUNTER — Telehealth: Payer: Self-pay | Admitting: Neurology

## 2019-09-20 LAB — IRON,TIBC AND FERRITIN PANEL
Ferritin: 50 ng/mL (ref 15–150)
Iron Saturation: 15 % (ref 15–55)
Iron: 66 ug/dL (ref 27–139)
Total Iron Binding Capacity: 430 ug/dL (ref 250–450)
UIBC: 364 ug/dL (ref 118–369)

## 2019-09-20 NOTE — Telephone Encounter (Signed)
AccessNurse 09/20/19 @ 12:31pm:  "Caller states she was returning a call."

## 2019-09-20 NOTE — Telephone Encounter (Signed)
Left message for patient to contact office.

## 2019-09-20 NOTE — Telephone Encounter (Signed)
Let pt know that ferritin was 50.  It has been shown that ferritin below 70-75 can still cause RLS even though its technically wnl.  She wasn't here for that issue but she could try taking Pt has low ferritin/iron deficiency causing RLS symptoms.  You can give her the following advice:  -try  ferrous sulfate 325 mg daily x 3 months.  Do not take with milk.  Discussed that this can cause constipation and to drink plenty of water.    -take vitamin C, 100-200 mg with each ferrous sulfate dose, or take the ferrous sulfate with small glass of orange juice (if not diabetic)  She can f/u with pcp for recheck in the future

## 2019-09-22 DIAGNOSIS — M25561 Pain in right knee: Secondary | ICD-10-CM | POA: Diagnosis not present

## 2019-09-22 DIAGNOSIS — M25562 Pain in left knee: Secondary | ICD-10-CM | POA: Diagnosis not present

## 2019-09-22 NOTE — Telephone Encounter (Signed)
The following MyChart msg was sent per pt request after our conversation -  Hello.  Per Dr Tat - Your ferritin was 50.  It has been shown that ferritin below 70-75 can cause RLS (restless leg syndrome) even though its technically within normal limits.   Try ferrous sulfate 325 mg daily x 3 months.  Do not take with milk. This can cause constipation so drink plenty of water.  Do not take with milk.  This can cause constipation so drink plenty of water. Take vitamin C, 100-200 mg with each ferrous sulfate dose, or take the ferrous sulfate with small glass of orange juice (if not diabetic).  Please f/u with your PCP for a re-check of labs after 3 months.  Best, Ladell Pier, RN

## 2019-09-22 NOTE — Telephone Encounter (Signed)
Patient left  Message on the VM stating that she was waiting on a call back from our office  Please call patient

## 2019-09-27 ENCOUNTER — Other Ambulatory Visit: Payer: Self-pay | Admitting: Family Medicine

## 2019-09-28 NOTE — Telephone Encounter (Signed)
Electronic refill request. Gabapentin Last office visit:   09/04/2019 Last Filled:   90 capsule 2 07/06/2019  Please advise.

## 2019-09-29 NOTE — Telephone Encounter (Signed)
Sent. Thanks.   

## 2019-10-05 DIAGNOSIS — G4733 Obstructive sleep apnea (adult) (pediatric): Secondary | ICD-10-CM | POA: Diagnosis not present

## 2019-10-09 ENCOUNTER — Other Ambulatory Visit: Payer: Self-pay | Admitting: Family Medicine

## 2019-10-09 DIAGNOSIS — G2581 Restless legs syndrome: Secondary | ICD-10-CM

## 2019-10-09 DIAGNOSIS — R251 Tremor, unspecified: Secondary | ICD-10-CM

## 2019-10-09 DIAGNOSIS — E119 Type 2 diabetes mellitus without complications: Secondary | ICD-10-CM

## 2019-10-09 NOTE — Telephone Encounter (Signed)
Electronic refill request. Ropinirole Last office visit:   09/04/2019 Last Filled:    235 tablet 0 07/14/2019  Please advise.

## 2019-10-10 DIAGNOSIS — G720 Drug-induced myopathy: Secondary | ICD-10-CM | POA: Insufficient documentation

## 2019-10-10 DIAGNOSIS — T466X5A Adverse effect of antihyperlipidemic and antiarteriosclerotic drugs, initial encounter: Secondary | ICD-10-CM | POA: Insufficient documentation

## 2019-10-10 MED ORDER — PROPRANOLOL HCL 10 MG PO TABS: 10.0000 mg | ORAL_TABLET | Freq: Two times a day (BID) | ORAL | 1 refills | Status: DC

## 2019-10-10 NOTE — Telephone Encounter (Signed)
Several issues here.  I would try changing metoprolol to propranolol and see if that helps with tremor.  If she noticed that she is more short of breath or having more wheeze with a change in medication then please let me know so we can change her back to metoprolol.  I would also try decreasing her Requip now that she is on extra iron per neurology.  I adjusted her dose to 1 tablet twice a day.  Let me know if she does not tolerate that.  She going to need follow-up labs here in about 3 months at or before visit.  Thanks.

## 2019-10-10 NOTE — Telephone Encounter (Signed)
Patient advised.  Appointments scheduled as requested.

## 2019-10-11 ENCOUNTER — Telehealth: Payer: Self-pay | Admitting: *Deleted

## 2019-10-11 NOTE — Telephone Encounter (Signed)
Mickel Baas pharmacist at [facility] left a voicemail stating that they got a refill on patient's Ropinirole showing two times a day. Mickel Baas stated that patient has been taking one in the morning and 1 1/2  In the pm. Mickel Baas wants to clarify that the directions have changed.  Called Mickel Baas back and read her the note written by Dr. [name] on 10/09/19 regarding the directions being changed to one tablet twice daily.Mickel Baas appreciated the clarification.

## 2019-10-12 DIAGNOSIS — M25561 Pain in right knee: Secondary | ICD-10-CM | POA: Diagnosis not present

## 2019-10-12 DIAGNOSIS — M25562 Pain in left knee: Secondary | ICD-10-CM | POA: Diagnosis not present

## 2019-10-12 DIAGNOSIS — M170 Bilateral primary osteoarthritis of knee: Secondary | ICD-10-CM | POA: Diagnosis not present

## 2019-10-12 DIAGNOSIS — M1712 Unilateral primary osteoarthritis, left knee: Secondary | ICD-10-CM | POA: Diagnosis not present

## 2019-10-12 DIAGNOSIS — M1711 Unilateral primary osteoarthritis, right knee: Secondary | ICD-10-CM | POA: Diagnosis not present

## 2019-10-15 DIAGNOSIS — J449 Chronic obstructive pulmonary disease, unspecified: Secondary | ICD-10-CM | POA: Diagnosis not present

## 2019-10-20 DIAGNOSIS — M25562 Pain in left knee: Secondary | ICD-10-CM | POA: Diagnosis not present

## 2019-10-20 DIAGNOSIS — M170 Bilateral primary osteoarthritis of knee: Secondary | ICD-10-CM | POA: Diagnosis not present

## 2019-10-20 DIAGNOSIS — M25561 Pain in right knee: Secondary | ICD-10-CM | POA: Diagnosis not present

## 2019-10-27 DIAGNOSIS — M170 Bilateral primary osteoarthritis of knee: Secondary | ICD-10-CM | POA: Diagnosis not present

## 2019-10-27 DIAGNOSIS — M1712 Unilateral primary osteoarthritis, left knee: Secondary | ICD-10-CM | POA: Diagnosis not present

## 2019-10-27 DIAGNOSIS — M25562 Pain in left knee: Secondary | ICD-10-CM | POA: Diagnosis not present

## 2019-10-27 DIAGNOSIS — M25561 Pain in right knee: Secondary | ICD-10-CM | POA: Diagnosis not present

## 2019-10-27 DIAGNOSIS — M1711 Unilateral primary osteoarthritis, right knee: Secondary | ICD-10-CM | POA: Diagnosis not present

## 2019-11-01 ENCOUNTER — Other Ambulatory Visit: Payer: Self-pay | Admitting: Family Medicine

## 2019-11-03 DIAGNOSIS — M170 Bilateral primary osteoarthritis of knee: Secondary | ICD-10-CM | POA: Diagnosis not present

## 2019-11-08 ENCOUNTER — Other Ambulatory Visit: Payer: Self-pay | Admitting: Family Medicine

## 2019-11-10 DIAGNOSIS — R69 Illness, unspecified: Secondary | ICD-10-CM | POA: Diagnosis not present

## 2019-11-10 NOTE — Telephone Encounter (Signed)
Electronic refill request. Dicyclomine Last office visit:   09/04/2019 Last Filled:    * * N O T I C E * * PRESCRIPTION PREVIOUSLY AUTHORIZED BY DOCTOR:BURGART, JENNIFER [phone]* *

## 2019-11-13 NOTE — Telephone Encounter (Signed)
Okay to continue.  Sent.  Thanks.

## 2019-11-15 DIAGNOSIS — J449 Chronic obstructive pulmonary disease, unspecified: Secondary | ICD-10-CM | POA: Diagnosis not present

## 2019-11-20 DIAGNOSIS — G4733 Obstructive sleep apnea (adult) (pediatric): Secondary | ICD-10-CM | POA: Diagnosis not present

## 2019-11-21 ENCOUNTER — Ambulatory Visit: Payer: Medicare HMO | Admitting: Physician Assistant

## 2019-11-23 NOTE — Progress Notes (Signed)
Cardiology Office Note:    Date:  11/24/2019   ID:  Kathleen Hatfield, DOB 1955-09-29, MRN [id number]  PCP:  [name], MD  Cardiologist:  [name], MD   Electrophysiologist:  None  Nephrologist:  Dr. Neta Ehlers [facility]) Pulmonologist:  Dr. Halford Chessman  Referring MD: [name], MD   Chief Complaint:  Follow-up (CHF)    Patient Profile:    Kathleen Hatfield is a 64 y.o. 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 Intolerant of statins  GERD  Diverticulitis s/p partial colectomy  Prior CV studies: Echocardiogram 05/18/2019 EF 70, no RWMA, mild LVH, Gr 1 DD, normal RVSF, severe RVH, Asc Ao 39 mm  Myoview 06/07/2019 EF 83, no ischemia or infarction; Low Risk  Echocardiogram 04/03/2016 Mild LVH, EF 65-70, no RWMA, Gr 1 DD, LVOT gradient (2.6 m/s) likely related to hyperdynamic LVF  Myoview 06/23/2012 No ischemia, EF 82; Low Risk  History of Present Illness:    Kathleen Hatfield was last seen in 4/21.  She returns for follow up.  She is here alone.  Overall, her breathing remains fairly stable.  She sleeps on an incline chronically.  She has not had syncope.  She has some chest discomfort with activity.  This is a chronic symptom without change.  Her lower extremity swelling is stable.  She takes metolazone about once to twice a week for weight gain of about 3 pounds or more.  Her metoprolol was switched to propranolol by neurology secondary to tremors.   Past Medical History:  Diagnosis Date  . Anemia   . Arthritis   . Asthma   . Chronic diastolic CHF 17/0017   Echocardiogram 05/2019: EF 70, no RWMA, mild LVH, Gr 1 DD, normal RVSF, severe LVH, borderline asc Aorta (39 mm)  . CKD (chronic kidney disease)   . Depression   . Diabetes mellitus without  complication ([place])   . Diverticulitis   . Dyspnea    with exertion  . GERD (gastroesophageal reflux disease)   . History of blood transfusion   . Hyperlipidemia    cannot tolerate statins  . Hypertension   . Nuclear stress test    Myoview 05/2019: EF 83, no ischemia or infarction; Low Risk  . Peripheral neuropathy   . Pneumonia   . PONV (postoperative nausea and vomiting)   . Sleep apnea    uses CPAP  . Ulcerative colitis ([place])    dr Collene Mares    Current Medications: Current Meds  Medication Sig  . acetaminophen (TYLENOL) 500 MG tablet Take 1,000 mg by mouth every 6 (six) hours as needed for pain.  Marland Kitchen albuterol (PROVENTIL HFA;VENTOLIN HFA) 108 (90 Base) MCG/ACT inhaler Inhale 2 puffs into the lungs every 4 (four) hours as needed for wheezing or shortness of breath.  Marland Kitchen BAYER CONTOUR NEXT TEST test strip 1 each by Other route daily. As directed  . budesonide (PULMICORT) 0.5 MG/2ML nebulizer solution INHALE CONTENTS OF ONE NEBULE VIA NEBULIZER ONCE DAILY  . buPROPion (WELLBUTRIN XL) 300 MG 24 hr tablet TAKE 1 TABLET BY MOUTH DAILY  . Cholecalciferol (VITAMIN D-3) 5000 UNITS TABS Take 5,000 Units by mouth every other day.   . dicyclomine (BENTYL) 20 MG tablet TAKE 1 TABLET UP TO FOUR TIMES A DAY FOR  GI CRAMPING, PAIN, AND NAUSEA/VOMITING AS NEEDED  . DULoxetine (CYMBALTA) 60 MG capsule TAKE 1 CAPSULE BY MOUTH ONCE DAILY.  . furosemide (LASIX) 40 MG tablet TAKE 1 & 1/2 TABLETS BY MOUTH DAILY.  Marland Kitchen gabapentin (NEURONTIN) 100 MG capsule TAKE 3 CAPSULES BY MOUTH AT BEDTIME.  Marland Kitchen GLOBAL EASE INJECT PEN NEEDLES 32G X 4 MM MISC 1 each by Other route daily.   . insulin degludec (TRESIBA FLEXTOUCH) 200 UNIT/ML FlexTouch Pen Inject 88 Units into the skin daily.  Marland Kitchen ipratropium-albuterol (DUONEB) 0.5-2.5 (3) MG/3ML SOLN Take 56ms by nebulization every 12 hrs  . metolazone (ZAROXOLYN) 5 MG tablet Take 2.5 mg by mouth daily as needed (Take with Lasix when weight is elevated 3 pounds overnight and 5 pounds or  more for 2 days).   .Marland KitchenMICROLET LANCETS MISC 1 each by Other route. As directed  . ondansetron (ZOFRAN ODT) 4 MG disintegrating tablet Take 1 tablet (4 mg total) by mouth every 8 (eight) hours as needed for nausea or vomiting.  . pantoprazole (PROTONIX) 40 MG tablet Take 40 mg by mouth daily.   . polyethylene glycol (MIRALAX / GLYCOLAX) packet Take 17 g by mouth daily as needed for constipation.  . potassium chloride (MICRO-K) 10 MEQ CR capsule TAKE 4 CAPSULES BY MOUTH TWICE A DAY.  .Marland Kitchenpropranolol (INDERAL) 10 MG tablet Take 1 tablet (10 mg total) by mouth 2 (two) times daily.  .Marland KitchenrOPINIRole (REQUIP) 4 MG tablet Take 1 tablet twice daily.  .Marland Kitchenspironolactone (ALDACTONE) 25 MG tablet TAKE 1 TABLET BY MOUTH DAILY  . traMADol (ULTRAM) 50 MG tablet Take 50 mg by mouth daily as needed for pain.  .Marland KitchenUNABLE TO FIND CPAP at bedtime  . VICTOZA 18 MG/3ML SOPN INJECT 1.8MG UNDER THE SKIN DAILY     Allergies:   Almond oil, Morphine and related, Atorvastatin, Ceclor [cefaclor], Ciprofloxacin, Levaquin [levofloxacin], Losartan, Statins, and Sulfa antibiotics   Social History   Tobacco Use  . Smoking status: Never Smoker  . Smokeless tobacco: Never Used  Vaping Use  . Vaping Use: Never used  Substance Use Topics  . Alcohol use: No  . Drug use: No     Family Hx: The patient's family history includes COPD in her father; Dementia in her mother; Heart disease in her brother, father, and mother; Hypertension in her father and mother; Other in her brother and brother. There is no history of Breast cancer or Colon cancer.  ROS See HPI  EKGs/Labs/Other Test Reviewed:    EKG:  EKG is  ordered today.  The ekg ordered today demonstrates normal sinus rhythm, heart rate 86, left axis deviation, right bundle branch block, QTC 488, no ST-T wave changes, no change from prior tracing  Recent Labs: 02/01/2019: B Natriuretic Peptide 37.4 05/10/2019: NT-Pro BNP 118 06/09/2019: TSH 2.850 06/22/2019: ALT 16 09/04/2019: BUN  30; Creatinine, Ser 1.16; Hemoglobin 13.2; Platelets 317; Potassium 3.6; Sodium 137   Recent Lipid Panel Lab Results  Component Value Date/Time   CHOL 333 04/26/2019 12:00 AM   TRIG 469 (A) 04/26/2019 12:00 AM   HDL 34 (L) 03/25/2013 05:00 AM   CHOLHDL 6.9 03/25/2013 05:00 AM   LDLCALC 133 (H) 03/25/2013 05:00 AM    Physical Exam:    VS:  BP 118/60   Pulse 86   Ht 5' 5"  (1.651 m)   Wt 261 lb (118.4 kg)   SpO2 94%   BMI 43.43 kg/m     Wt Readings from Last 3 Encounters:  11/24/19 261 lb (118.4 kg)  09/19/19 262 lb (118.8 kg)  09/04/19 265 lb (120.2 kg)     Constitutional:      Appearance: Healthy appearance. Not in distress.  Neck:     Vascular: No JVR.  Pulmonary:     Effort: Pulmonary effort is normal.     Breath sounds: No wheezing. No rales.  Cardiovascular:     Normal rate. Regular rhythm. Normal S1. Normal S2.     Murmurs: There is no murmur.  Edema:    Ankle: bilateral trace edema of the ankle. Abdominal:     Palpations: Abdomen is soft.  Skin:    General: Skin is warm and dry.  Neurological:     Mental Status: Alert and oriented to person, place and time.     Cranial Nerves: Cranial nerves are intact.       ASSESSMENT & PLAN:    1. Chronic diastolic heart failure (HCC) Overall, volume status stable.  She is NYHA IIb-III.  She takes as needed metolazone for weight gain.  Continue current dose of spironolactone, furosemide, as needed metolazone.  Obtain follow-up BMET today.  Follow-up in 6 months.  2. Essential hypertension The patient's blood pressure is controlled on her current regimen.  Continue current therapy.   3. Stage 3a chronic kidney disease She tends to have significant issues with hypokalemia.  This seems to have been better since starting on spironolactone.  Obtain follow-up BMET today.  4. Mixed hyperlipidemia Managed by primary care.   5. COPD/Asthma Continue follow up with pulmonology.  Echocardiogram in 3/21 with RVH which is  likely related to pulmonary hypertension from lung dz, OSA and morbid obesity.     Dispo:  Return in about 6 months (around 05/23/2020) for Routine Follow Up, w/ Dr. Burt Knack, in person.   Medication Adjustments/Labs and Tests Ordered: Current medicines are reviewed at length with the patient today.  Concerns regarding medicines are outlined above.  Tests Ordered: Orders Placed This Encounter  Procedures  . Basic metabolic panel  . EKG 12-Lead   Medication Changes: No orders of the defined types were placed in this encounter.   Signed, Richardson Dopp, PA-C  11/24/2019 1:37 PM    [facility] [address], [place], [place]  [address] Phone: [phone]; Fax: [phone]

## 2019-11-24 ENCOUNTER — Encounter: Payer: Self-pay | Admitting: Physician Assistant

## 2019-11-24 ENCOUNTER — Ambulatory Visit: Payer: Medicare HMO | Admitting: Physician Assistant

## 2019-11-24 ENCOUNTER — Other Ambulatory Visit: Payer: Self-pay

## 2019-11-24 VITALS — BP 118/60 | HR 86 | Ht 65.0 in | Wt 261.0 lb

## 2019-11-24 DIAGNOSIS — J449 Chronic obstructive pulmonary disease, unspecified: Secondary | ICD-10-CM | POA: Diagnosis not present

## 2019-11-24 DIAGNOSIS — E782 Mixed hyperlipidemia: Secondary | ICD-10-CM | POA: Diagnosis not present

## 2019-11-24 DIAGNOSIS — I10 Essential (primary) hypertension: Secondary | ICD-10-CM | POA: Diagnosis not present

## 2019-11-24 DIAGNOSIS — N1831 Chronic kidney disease, stage 3a: Secondary | ICD-10-CM | POA: Diagnosis not present

## 2019-11-24 DIAGNOSIS — I5032 Chronic diastolic (congestive) heart failure: Secondary | ICD-10-CM | POA: Diagnosis not present

## 2019-11-24 NOTE — Patient Instructions (Addendum)
Medication Instructions:  Your physician recommends that you continue on your current medications as directed. Please refer to the Current Medication list given to you today.  *If you need a refill on your cardiac medications before your next appointment, please call your pharmacy*  Lab Work: You will have labs drawn today: BMET  If you have labs (blood work) drawn today and your tests are completely normal, you will receive your results only by: Marland Kitchen MyChart Message (if you have MyChart) OR . A paper copy in the mail If you have any lab test that is abnormal or we need to change your treatment, we will call you to review the results.  Testing/Procedures: None ordered today  Follow-Up: On 05/30/2020 at 2:40PM with [name], MD

## 2019-11-25 LAB — BASIC METABOLIC PANEL
BUN/Creatinine Ratio: 25 (ref 12–28)
BUN: 29 mg/dL — ABNORMAL HIGH (ref 8–27)
CO2: 29 mmol/L (ref 20–29)
Calcium: 10.5 mg/dL — ABNORMAL HIGH (ref 8.7–10.3)
Chloride: 96 mmol/L (ref 96–106)
Creatinine, Ser: 1.18 mg/dL — ABNORMAL HIGH (ref 0.57–1.00)
GFR calc Af Amer: 56 mL/min/{1.73_m2} — ABNORMAL LOW (ref >59)
GFR calc non Af Amer: 49 mL/min/{1.73_m2} — ABNORMAL LOW (ref >59)
Glucose: 170 mg/dL — ABNORMAL HIGH (ref 65–99)
Potassium: 3.8 mmol/L (ref 3.5–5.2)
Sodium: 143 mmol/L (ref 134–144)

## 2019-11-29 ENCOUNTER — Other Ambulatory Visit: Payer: Self-pay | Admitting: Family Medicine

## 2019-11-30 NOTE — Telephone Encounter (Signed)
Electronic refill request. Gabapentin Last office visit:   09/04/2019 Last Filled:    90 capsule 1 09/29/2019

## 2019-12-01 NOTE — Telephone Encounter (Signed)
Sent. Thanks.   

## 2019-12-06 ENCOUNTER — Telehealth: Payer: Self-pay | Admitting: Physician Assistant

## 2019-12-06 DIAGNOSIS — I517 Cardiomegaly: Secondary | ICD-10-CM

## 2019-12-06 DIAGNOSIS — R0602 Shortness of breath: Secondary | ICD-10-CM

## 2019-12-06 NOTE — Telephone Encounter (Signed)
-----   Message from [name], MD sent at 12/05/2019  3:06 PM EDT ----- [name] Kathleen Hatfield can you set her up for a CMR if her size isn't prohibitive? thanks ----- Message ----- From: Larey Dresser, MD Sent: 12/05/2019  10:46 AM EDT To: [name], MD  RVH is impressive.  Would expect pulmonary hypertension, but did not have in 2015.  Unfortunately did not get TR jet on this study.  However, good RV function and normal RV size would argue against.  ?Some sort of fibrotic process (though with ARVC would expect RV dysfunction). Only additional testing I could think might help could be cardiac MRI to look for infiltrative process.  Could consider repeat RHC if she having more symptoms (though normal in 2015 with symptoms argues against).     Not straightforward.  ----- Message ----- From: [name], MD Sent: 12/05/2019  10:05 AM EDT To: Larey Dresser, MD, Liliane Shi, PA-C  [facility] sorry for the delayed response.  I reviewed her echo and she does have fairly impressive RVH.  Her LV function is extremely vigorous as well.  Kirk Ruths did a right heart cath on her in 2015 and her hemodynamics were normal.  I am not sure if it is worth revisiting that issue as I suspect there would be little to offer her.  Kirk Ruths do you think there is any reason to see her?  If not we will continue with our current management.  Thx Ronalee Belts ----- Message ----- From: [name] Sent: 11/24/2019   1:21 PM EDT To: [name], MD  Ronalee Belts I saw her in follow up today and was looking at her echocardiogram report from 05/2019 again.  I just wanted to make sure I'm not missing something.  She is morbidly obese and has asthma/COPD as well as OSA.  She has severe RVH.  Her PASP could not be estimated. Is it safe to assume that she has pulmonary hypertension related to her obesity, lund dz and OSA or is there something else I should be worried about? Thanks for your help. Moxon Messler

## 2019-12-06 NOTE — Telephone Encounter (Signed)
Reviewed with Dr. Burt Knack and Dr. Aundra Dubin. Discussed with patient by telephone today. She is agreeable to pursue cardiac MRI. PLAN: 1.  Please schedule cardiac MRI -order is in chart Richardson Dopp, PA-C    12/06/2019 4:51 PM

## 2019-12-07 ENCOUNTER — Telehealth: Payer: Self-pay | Admitting: Physician Assistant

## 2019-12-07 NOTE — Telephone Encounter (Signed)
Called and spoke with patient regarding preferred weekdays and times for scheduling the Cardiac MRI that has been ordered.  Patient states a Monday or Friday after 11:00 am is the best.  Informed patient as soon as we hear regarding the insurance prior authorization, I will call with her appt.  She voiced her understanding.

## 2019-12-11 ENCOUNTER — Encounter: Payer: Self-pay | Admitting: Physician Assistant

## 2019-12-11 NOTE — Telephone Encounter (Signed)
Patient returned my call regarding the appointment for Cardiac MRI scheduled Monday 01/01/20 at 11:00 am at [facility]----arrival time is 10:30 am-1st floor admissions office.  Will mail information to patient and it is also available in My Chart.  Patient voiced her understanding.

## 2019-12-11 NOTE — Telephone Encounter (Signed)
Left message for patient to call regarding appointment scheduled for Cardiac MIR Monday 01/01/20 at 11:00 am---arrival time is 10:30 am---1st floor admissions office.

## 2019-12-13 NOTE — Telephone Encounter (Signed)
MRI scheduled for 10/25. Richardson Dopp, PA-C    12/13/2019 4:33 PM

## 2019-12-21 DIAGNOSIS — G4733 Obstructive sleep apnea (adult) (pediatric): Secondary | ICD-10-CM | POA: Diagnosis not present

## 2019-12-22 DIAGNOSIS — E119 Type 2 diabetes mellitus without complications: Secondary | ICD-10-CM | POA: Diagnosis not present

## 2019-12-22 DIAGNOSIS — H5213 Myopia, bilateral: Secondary | ICD-10-CM | POA: Diagnosis not present

## 2019-12-22 LAB — HM DIABETES EYE EXAM

## 2019-12-25 ENCOUNTER — Other Ambulatory Visit: Payer: Self-pay

## 2019-12-25 ENCOUNTER — Ambulatory Visit (INDEPENDENT_AMBULATORY_CARE_PROVIDER_SITE_OTHER): Payer: Medicare HMO | Admitting: Family Medicine

## 2019-12-25 ENCOUNTER — Encounter: Payer: Self-pay | Admitting: Family Medicine

## 2019-12-25 VITALS — BP 124/78 | HR 84 | Temp 97.2°F | Ht 65.0 in | Wt 262.0 lb

## 2019-12-25 DIAGNOSIS — R350 Frequency of micturition: Secondary | ICD-10-CM

## 2019-12-25 DIAGNOSIS — N3090 Cystitis, unspecified without hematuria: Secondary | ICD-10-CM

## 2019-12-25 LAB — POC URINALSYSI DIPSTICK (AUTOMATED)
Bilirubin, UA: NEGATIVE
Blood, UA: NEGATIVE
Glucose, UA: NEGATIVE
Ketones, UA: NEGATIVE
Nitrite, UA: NEGATIVE
Protein, UA: POSITIVE — AB
Spec Grav, UA: >=1.03 — AB (ref 1.010–1.025)
Urobilinogen, UA: NEGATIVE E.U./dL — AB
pH, UA: 6 (ref 5.0–8.0)

## 2019-12-25 MED ORDER — CEPHALEXIN 500 MG PO CAPS: 500.0000 mg | ORAL_CAPSULE | Freq: Two times a day (BID) | ORAL | 0 refills | Status: AC

## 2019-12-25 NOTE — Progress Notes (Signed)
   Subjective:    Patient ID: Kathleen Hatfield, female    DOB: 1955-10-10, 64 y.o.   MRN: [id number]  HPI Chief Complaint  Patient presents with  . Dysuria    x6 days, pressure  . Urinary Frequency   This is a 64 yo female who presents today with above cc. Used a test from [facility] and was positive. Chills, unknown if fever, no nausea, vomiting, back pain, abdominal pain. No gross hematuria, urine is "milky." Drinks at least 2 liters of water a day.  No vaginal symptoms.   Occasional constipation/ diarrhea 1x/ month.   History of CHF, 2 liter fluid restriction, she goes over this. No leg swelling.   DM, blood sugars running 150s, "which is good for me."   Review of Systems Per HPI    Objective:   Physical Exam Physical Exam  Constitutional: She is oriented to person, place, and time. She appears well-developed and well-nourished. No distress. Obese.  HENT:  Head: Normocephalic and atraumatic.  Cardiovascular: Normal rate, regular rhythm and normal heart sounds.   Pulmonary/Chest: Effort normal and breath sounds normal.  Abdominal: Soft. She exhibits no distension. There is no tenderness. There is no rebound, no guarding and no CVA tenderness.  Neurological: She is alert and oriented to person, place, and time.  Skin: Skin is warm and dry. She is not diaphoretic.  Psychiatric: She has a normal mood and affect. Her behavior is normal. Judgment and thought content normal.  Vitals reviewed.     BP 124/78   Pulse 84   Temp (!) 97.2 F (36.2 C) (Temporal)   Ht 5' 5"  (1.651 m)   Wt 262 lb (118.8 kg)   SpO2 95%   BMI 43.60 kg/m  Wt Readings from Last 3 Encounters:  12/25/19 262 lb (118.8 kg)  11/24/19 261 lb (118.4 kg)  09/19/19 262 lb (118.8 kg)       Assessment & Plan:  1. Urinary frequency - POCT Urinalysis Dipstick (Automated)  2. Cystitis - Provided written and verbal information regarding diagnosis and treatment. - ER/UC/ follow up precautions reviewed - cephALEXin  (KEFLEX) 500 MG capsule; Take 1 capsule (500 mg total) by mouth 2 (two) times daily for 5 days.  Dispense: 10 capsule; Refill: 0 - Urine Culture  This visit occurred during the SARS-CoV-2 public health emergency.  Safety protocols were in place, including screening questions prior to the visit, additional usage of staff PPE, and extensive cleaning of exam room while observing appropriate contact time as indicated for disinfecting solutions.    Clarene Reamer, FNP-BC  [facility], [facility]  12/25/2019 3:25 PM

## 2019-12-25 NOTE — Patient Instructions (Signed)
Good to see you today   Urinary Tract Infection, Adult A urinary tract infection (UTI) is an infection of any part of the urinary tract. The urinary tract includes:  The kidneys.  The ureters.  The bladder.  The urethra. These organs make, store, and get rid of pee (urine) in the body. What are the causes? This is caused by germs (bacteria) in your genital area. These germs grow and cause swelling (inflammation) of your urinary tract. What increases the risk? You are more likely to develop this condition if:  You have a small, thin tube (catheter) to drain pee.  You cannot control when you pee or poop (incontinence).  You are female, and: ? You use these methods to prevent pregnancy:  A medicine that kills sperm (spermicide).  A device that blocks sperm (diaphragm). ? You have low levels of a female hormone (estrogen). ? You are pregnant.  You have genes that add to your risk.  You are sexually active.  You take antibiotic medicines.  You have trouble peeing because of: ? A prostate that is bigger than normal, if you are female. ? A blockage in the part of your body that drains pee from the bladder (urethra). ? A kidney stone. ? A nerve condition that affects your bladder (neurogenic bladder). ? Not getting enough to drink. ? Not peeing often enough.  You have other conditions, such as: ? Diabetes. ? A weak disease-fighting system (immune system). ? Sickle cell disease. ? Gout. ? Injury of the spine. What are the signs or symptoms? Symptoms of this condition include:  Needing to pee right away (urgently).  Peeing often.  Peeing small amounts often.  Pain or burning when peeing.  Blood in the pee.  Pee that smells bad or not like normal.  Trouble peeing.  Pee that is cloudy.  Fluid coming from the vagina, if you are female.  Pain in the belly or lower back. Other symptoms include:  Throwing up (vomiting).  No urge to eat.  Feeling mixed up  (confused).  Being tired and grouchy (irritable).  A fever.  Watery poop (diarrhea). How is this treated? This condition may be treated with:  Antibiotic medicine.  Other medicines.  Drinking enough water. Follow these instructions at home:  Medicines  Take over-the-counter and prescription medicines only as told by your doctor.  If you were prescribed an antibiotic medicine, take it as told by your doctor. Do not stop taking it even if you start to feel better. General instructions  Make sure you: ? Pee until your bladder is empty. ? Do not hold pee for a long time. ? Empty your bladder after sex. ? Wipe from front to back after pooping if you are a female. Use each tissue one time when you wipe.  Drink enough fluid to keep your pee pale yellow.  Keep all follow-up visits as told by your doctor. This is important. Contact a doctor if:  You do not get better after 1-2 days.  Your symptoms go away and then come back. Get help right away if:  You have very bad back pain.  You have very bad pain in your lower belly.  You have a fever.  You are sick to your stomach (nauseous).  You are throwing up. Summary  A urinary tract infection (UTI) is an infection of any part of the urinary tract.  This condition is caused by germs in your genital area.  There are many risk factors for a  UTI. These include having a small, thin tube to drain pee and not being able to control when you pee or poop.  Treatment includes antibiotic medicines for germs.  Drink enough fluid to keep your pee pale yellow. This information is not intended to replace advice given to you by your health care provider. Make sure you discuss any questions you have with your health care provider. Document Revised: 02/10/2018 Document Reviewed: 09/02/2017 Elsevier Patient Education  2020 [employer].

## 2019-12-28 LAB — URINE CULTURE
MICRO NUMBER:: 11084168
SPECIMEN QUALITY:: ADEQUATE

## 2019-12-29 ENCOUNTER — Telehealth (HOSPITAL_COMMUNITY): Payer: Self-pay | Admitting: Emergency Medicine

## 2019-12-29 NOTE — Telephone Encounter (Signed)
Reaching out to patient to offer assistance regarding upcoming cardiac imaging study; pt verbalizes understanding of appt date/time, parking situation and where to check in, , and verified current allergies; name and call back number provided for further questions should they arise Marchia Bond RN Navigator Cardiac Imaging [name] [phone] office [phone] cell   Pt denies claustro, denies implants, states she does have some issues lying flat and breathing

## 2020-01-01 ENCOUNTER — Other Ambulatory Visit: Payer: Self-pay

## 2020-01-01 ENCOUNTER — Ambulatory Visit (HOSPITAL_COMMUNITY)
Admission: RE | Admit: 2020-01-01 | Discharge: 2020-01-01 | Disposition: A | Discharge status: Home / Self Care | Payer: Medicare HMO | Source: Ambulatory Visit | Attending: Physician Assistant | Admitting: Physician Assistant

## 2020-01-01 DIAGNOSIS — R0602 Shortness of breath: Secondary | ICD-10-CM | POA: Insufficient documentation

## 2020-01-01 DIAGNOSIS — I517 Cardiomegaly: Secondary | ICD-10-CM | POA: Diagnosis not present

## 2020-01-01 MED ORDER — GADOBUTROL 1 MMOL/ML IV SOLN
10.0000 mL | Freq: Once | INTRAVENOUS | Status: AC | PRN
  Administered 2020-01-01: 10 mL via INTRAVENOUS

## 2020-01-03 ENCOUNTER — Other Ambulatory Visit: Payer: Self-pay | Admitting: Family Medicine

## 2020-01-03 NOTE — Telephone Encounter (Signed)
Refill request for Potassium last refill 11/02/19 #240/1 See drug interaction with Spironolactone  Requip last refill 10/10/19 #180 Last office visit 12/25/19 acute Upcoming appointment 01/15/20

## 2020-01-03 NOTE — Telephone Encounter (Signed)
Sent. Thanks.  Okay to continue.

## 2020-01-04 ENCOUNTER — Encounter: Payer: Self-pay | Admitting: Family Medicine

## 2020-01-04 ENCOUNTER — Telehealth: Payer: Self-pay | Admitting: Physician Assistant

## 2020-01-04 NOTE — Telephone Encounter (Signed)
° ° °  Pt is returning call to ger MRI result

## 2020-01-05 ENCOUNTER — Other Ambulatory Visit: Payer: Self-pay

## 2020-01-05 DIAGNOSIS — R0602 Shortness of breath: Secondary | ICD-10-CM

## 2020-01-05 DIAGNOSIS — I10 Essential (primary) hypertension: Secondary | ICD-10-CM

## 2020-01-05 DIAGNOSIS — I5032 Chronic diastolic (congestive) heart failure: Secondary | ICD-10-CM

## 2020-01-05 DIAGNOSIS — N1831 Chronic kidney disease, stage 3a: Secondary | ICD-10-CM

## 2020-01-05 MED ORDER — TORSEMIDE 20 MG PO TABS: 40.0000 mg | ORAL_TABLET | Freq: Every day | ORAL | 3 refills | Status: DC

## 2020-01-05 MED ORDER — POTASSIUM CHLORIDE CRYS ER 20 MEQ PO TBCR: 60.0000 meq | EXTENDED_RELEASE_TABLET | Freq: Two times a day (BID) | ORAL | 3 refills | Status: DC

## 2020-01-05 NOTE — Telephone Encounter (Signed)
The patient has been notified of the result and verbalized understanding.  All questions (if any) were answered. Follow up labs and appointment made. Medication list updated. Mady Haagensen, [place] 01/05/2020 4:57 PM

## 2020-01-05 NOTE — Telephone Encounter (Signed)
I called and spoke with patient about MRI results and medication recommendations. Patient wants to know about the epicardial adipose tissue and the lipomatous atrial septal hypertrophy seen on MRI. She wants to know if these are related to her symptoms? She states that her asthma medication is not working. I asked her if she had reached out to her pulmonologist, she states that she has not yet, she wanted to know about the findings on the MRI first.

## 2020-01-05 NOTE — Telephone Encounter (Signed)
These are incidental findings and are not causing any symptoms.  The epicardial fat pad is possibly related to her obesity (BMI > 40).  But, there is nothing to be concerned about from a cardiac standpoint about these findings on MRI.  Richardson Dopp, PA-C    01/05/2020 1:16 PM

## 2020-01-08 ENCOUNTER — Other Ambulatory Visit: Payer: Medicare HMO

## 2020-01-08 ENCOUNTER — Telehealth: Payer: Self-pay | Admitting: Pulmonary Disease

## 2020-01-08 ENCOUNTER — Encounter (HOSPITAL_COMMUNITY): Payer: Self-pay

## 2020-01-08 ENCOUNTER — Ambulatory Visit: Admit: 2020-01-08 | Discharge status: Absent without leave | Payer: Medicare HMO

## 2020-01-08 ENCOUNTER — Inpatient Hospital Stay (HOSPITAL_COMMUNITY)
Admission: EM | Admit: 2020-01-08 | Discharge: 2020-01-10 | DRG: 202 | Disposition: A | Discharge status: Home / Self Care | Payer: Medicare HMO | Attending: Family Medicine | Admitting: Family Medicine

## 2020-01-08 ENCOUNTER — Telehealth: Payer: Self-pay

## 2020-01-08 ENCOUNTER — Emergency Department (HOSPITAL_COMMUNITY): Payer: Medicare HMO

## 2020-01-08 ENCOUNTER — Other Ambulatory Visit: Payer: Self-pay

## 2020-01-08 DIAGNOSIS — K5190 Ulcerative colitis, unspecified, without complications: Secondary | ICD-10-CM | POA: Diagnosis present

## 2020-01-08 DIAGNOSIS — R5383 Other fatigue: Secondary | ICD-10-CM | POA: Diagnosis present

## 2020-01-08 DIAGNOSIS — J45901 Unspecified asthma with (acute) exacerbation: Secondary | ICD-10-CM | POA: Diagnosis not present

## 2020-01-08 DIAGNOSIS — Z888 Allergy status to other drugs, medicaments and biological substances status: Secondary | ICD-10-CM

## 2020-01-08 DIAGNOSIS — Z825 Family history of asthma and other chronic lower respiratory diseases: Secondary | ICD-10-CM

## 2020-01-08 DIAGNOSIS — R0602 Shortness of breath: Secondary | ICD-10-CM | POA: Diagnosis not present

## 2020-01-08 DIAGNOSIS — M797 Fibromyalgia: Secondary | ICD-10-CM | POA: Diagnosis present

## 2020-01-08 DIAGNOSIS — E1165 Type 2 diabetes mellitus with hyperglycemia: Secondary | ICD-10-CM | POA: Diagnosis present

## 2020-01-08 DIAGNOSIS — J4531 Mild persistent asthma with (acute) exacerbation: Secondary | ICD-10-CM | POA: Diagnosis not present

## 2020-01-08 DIAGNOSIS — J9601 Acute respiratory failure with hypoxia: Secondary | ICD-10-CM | POA: Diagnosis present

## 2020-01-08 DIAGNOSIS — J4541 Moderate persistent asthma with (acute) exacerbation: Secondary | ICD-10-CM | POA: Diagnosis not present

## 2020-01-08 DIAGNOSIS — Z794 Long term (current) use of insulin: Secondary | ICD-10-CM

## 2020-01-08 DIAGNOSIS — Z20822 Contact with and (suspected) exposure to covid-19: Secondary | ICD-10-CM | POA: Diagnosis present

## 2020-01-08 DIAGNOSIS — I5032 Chronic diastolic (congestive) heart failure: Secondary | ICD-10-CM | POA: Diagnosis present

## 2020-01-08 DIAGNOSIS — M6281 Muscle weakness (generalized): Secondary | ICD-10-CM | POA: Diagnosis present

## 2020-01-08 DIAGNOSIS — I10 Essential (primary) hypertension: Secondary | ICD-10-CM | POA: Diagnosis present

## 2020-01-08 DIAGNOSIS — E785 Hyperlipidemia, unspecified: Secondary | ICD-10-CM | POA: Diagnosis present

## 2020-01-08 DIAGNOSIS — G4733 Obstructive sleep apnea (adult) (pediatric): Secondary | ICD-10-CM | POA: Diagnosis present

## 2020-01-08 DIAGNOSIS — Z8249 Family history of ischemic heart disease and other diseases of the circulatory system: Secondary | ICD-10-CM

## 2020-01-08 DIAGNOSIS — F32A Depression, unspecified: Secondary | ICD-10-CM | POA: Diagnosis present

## 2020-01-08 DIAGNOSIS — I5189 Other ill-defined heart diseases: Secondary | ICD-10-CM

## 2020-01-08 DIAGNOSIS — K219 Gastro-esophageal reflux disease without esophagitis: Secondary | ICD-10-CM | POA: Diagnosis present

## 2020-01-08 DIAGNOSIS — Z7951 Long term (current) use of inhaled steroids: Secondary | ICD-10-CM

## 2020-01-08 DIAGNOSIS — N1831 Chronic kidney disease, stage 3a: Secondary | ICD-10-CM | POA: Diagnosis present

## 2020-01-08 DIAGNOSIS — F419 Anxiety disorder, unspecified: Secondary | ICD-10-CM | POA: Diagnosis present

## 2020-01-08 DIAGNOSIS — I130 Hypertensive heart and chronic kidney disease with heart failure and stage 1 through stage 4 chronic kidney disease, or unspecified chronic kidney disease: Secondary | ICD-10-CM | POA: Diagnosis present

## 2020-01-08 DIAGNOSIS — E1122 Type 2 diabetes mellitus with diabetic chronic kidney disease: Secondary | ICD-10-CM | POA: Diagnosis present

## 2020-01-08 DIAGNOSIS — Z79899 Other long term (current) drug therapy: Secondary | ICD-10-CM

## 2020-01-08 DIAGNOSIS — Z882 Allergy status to sulfonamides status: Secondary | ICD-10-CM

## 2020-01-08 DIAGNOSIS — Z885 Allergy status to narcotic agent status: Secondary | ICD-10-CM

## 2020-01-08 LAB — CBC
HCT: 44.7 % (ref 36.0–46.0)
Hemoglobin: 14.5 g/dL (ref 12.0–15.0)
MCH: 29.6 pg (ref 26.0–34.0)
MCHC: 32.4 g/dL (ref 30.0–36.0)
MCV: 91.2 fL (ref 80.0–100.0)
Platelets: 307 10*3/uL (ref 150–400)
RBC: 4.9 MIL/uL (ref 3.87–5.11)
RDW: 15.6 % — ABNORMAL HIGH (ref 11.5–15.5)
WBC: 11.7 10*3/uL — ABNORMAL HIGH (ref 4.0–10.5)
nRBC: 0 % (ref 0.0–0.2)

## 2020-01-08 LAB — BASIC METABOLIC PANEL
Anion gap: 15 (ref 5–15)
BUN: 22 mg/dL (ref 8–23)
CO2: 32 mmol/L (ref 22–32)
Calcium: 10 mg/dL (ref 8.9–10.3)
Chloride: 88 mmol/L — ABNORMAL LOW (ref 98–111)
Creatinine, Ser: 1.23 mg/dL — ABNORMAL HIGH (ref 0.44–1.00)
GFR, Estimated: 49 mL/min — ABNORMAL LOW (ref >60)
Glucose, Bld: 328 mg/dL — ABNORMAL HIGH (ref 70–99)
Potassium: 4.1 mmol/L (ref 3.5–5.1)
Sodium: 135 mmol/L (ref 135–145)

## 2020-01-08 LAB — BRAIN NATRIURETIC PEPTIDE: B Natriuretic Peptide: 58.9 pg/mL (ref 0.0–100.0)

## 2020-01-08 LAB — CBG MONITORING, ED
Glucose-Capillary: 258 mg/dL — ABNORMAL HIGH (ref 70–99)
Glucose-Capillary: 437 mg/dL — ABNORMAL HIGH (ref 70–99)

## 2020-01-08 LAB — RESPIRATORY PANEL BY RT PCR (FLU A&B, COVID)
Influenza A by PCR: NEGATIVE
Influenza B by PCR: NEGATIVE
SARS Coronavirus 2 by RT PCR: NEGATIVE

## 2020-01-08 MED ORDER — IPRATROPIUM BROMIDE HFA 17 MCG/ACT IN AERS
2.0000 | INHALATION_SPRAY | Freq: Once | RESPIRATORY_TRACT | Status: AC
  Administered 2020-01-08: 2 via RESPIRATORY_TRACT
  Filled 2020-01-08: qty 12.9

## 2020-01-08 MED ORDER — PROPRANOLOL HCL 10 MG PO TABS
10.0000 mg | ORAL_TABLET | Freq: Two times a day (BID) | ORAL | Status: DC
  Administered 2020-01-08 – 2020-01-10 (×4): 10 mg via ORAL
  Filled 2020-01-08 (×4): qty 1

## 2020-01-08 MED ORDER — PANTOPRAZOLE SODIUM 40 MG PO TBEC
40.0000 mg | DELAYED_RELEASE_TABLET | Freq: Every day | ORAL | Status: DC
  Administered 2020-01-08 – 2020-01-10 (×3): 40 mg via ORAL
  Filled 2020-01-08 (×3): qty 1

## 2020-01-08 MED ORDER — ENOXAPARIN SODIUM 60 MG/0.6ML ~~LOC~~ SOLN
55.0000 mg | SUBCUTANEOUS | Status: DC
  Administered 2020-01-08 – 2020-01-09 (×2): 55 mg via SUBCUTANEOUS
  Filled 2020-01-08 (×2): qty 0.55
  Filled 2020-01-08: qty 0.6

## 2020-01-08 MED ORDER — MAGNESIUM SULFATE 2 GM/50ML IV SOLN
2.0000 g | Freq: Once | INTRAVENOUS | Status: AC
  Administered 2020-01-08: 2 g via INTRAVENOUS
  Filled 2020-01-08: qty 50

## 2020-01-08 MED ORDER — SPIRONOLACTONE 25 MG PO TABS
25.0000 mg | ORAL_TABLET | Freq: Every day | ORAL | Status: DC
  Administered 2020-01-08 – 2020-01-10 (×3): 25 mg via ORAL
  Filled 2020-01-08 (×4): qty 1

## 2020-01-08 MED ORDER — BUPROPION HCL ER (XL) 150 MG PO TB24
300.0000 mg | ORAL_TABLET | Freq: Every day | ORAL | Status: DC
  Administered 2020-01-08 – 2020-01-10 (×3): 300 mg via ORAL
  Filled 2020-01-08 (×3): qty 2

## 2020-01-08 MED ORDER — METHYLPREDNISOLONE SODIUM SUCC 125 MG IJ SOLR
125.0000 mg | Freq: Once | INTRAMUSCULAR | Status: AC
  Administered 2020-01-08: 125 mg via INTRAVENOUS
  Filled 2020-01-08: qty 2

## 2020-01-08 MED ORDER — METHYLPREDNISOLONE SODIUM SUCC 125 MG IJ SOLR
60.0000 mg | Freq: Two times a day (BID) | INTRAMUSCULAR | Status: DC
  Administered 2020-01-09 – 2020-01-10 (×3): 60 mg via INTRAVENOUS
  Filled 2020-01-08 (×3): qty 2

## 2020-01-08 MED ORDER — TORSEMIDE 20 MG PO TABS
40.0000 mg | ORAL_TABLET | Freq: Every day | ORAL | Status: DC
  Administered 2020-01-08 – 2020-01-10 (×3): 40 mg via ORAL
  Filled 2020-01-08 (×3): qty 2

## 2020-01-08 MED ORDER — GABAPENTIN 300 MG PO CAPS
300.0000 mg | ORAL_CAPSULE | Freq: Every day | ORAL | Status: DC
  Administered 2020-01-08 – 2020-01-09 (×2): 300 mg via ORAL
  Filled 2020-01-08 (×2): qty 1

## 2020-01-08 MED ORDER — INSULIN ASPART 100 UNIT/ML ~~LOC~~ SOLN
0.0000 [IU] | Freq: Three times a day (TID) | SUBCUTANEOUS | Status: DC
  Administered 2020-01-09 (×2): 20 [IU] via SUBCUTANEOUS

## 2020-01-08 MED ORDER — DICYCLOMINE HCL 20 MG PO TABS
20.0000 mg | ORAL_TABLET | Freq: Three times a day (TID) | ORAL | Status: DC
  Administered 2020-01-09 – 2020-01-10 (×4): 20 mg via ORAL
  Filled 2020-01-08 (×5): qty 1

## 2020-01-08 MED ORDER — LIRAGLUTIDE 18 MG/3ML ~~LOC~~ SOPN: 1.8000 mg | PEN_INJECTOR | Freq: Every day | SUBCUTANEOUS | Status: DC

## 2020-01-08 MED ORDER — TRAMADOL HCL 50 MG PO TABS
50.0000 mg | ORAL_TABLET | Freq: Four times a day (QID) | ORAL | Status: DC | PRN
  Administered 2020-01-09: 50 mg via ORAL
  Filled 2020-01-08: qty 1

## 2020-01-08 MED ORDER — POLYETHYLENE GLYCOL 3350 17 G PO PACK: 17.0000 g | PACK | Freq: Every day | ORAL | Status: DC | PRN

## 2020-01-08 MED ORDER — BUDESONIDE 0.5 MG/2ML IN SUSP
0.5000 mg | Freq: Every day | RESPIRATORY_TRACT | Status: DC
  Administered 2020-01-10: 0.5 mg via RESPIRATORY_TRACT
  Filled 2020-01-08 (×2): qty 2

## 2020-01-08 MED ORDER — ALBUTEROL SULFATE HFA 108 (90 BASE) MCG/ACT IN AERS: 2.0000 | INHALATION_SPRAY | RESPIRATORY_TRACT | Status: DC | PRN

## 2020-01-08 MED ORDER — VITAMIN D 25 MCG (1000 UNIT) PO TABS
5000.0000 [IU] | ORAL_TABLET | ORAL | Status: DC
  Administered 2020-01-09: 5000 [IU] via ORAL
  Filled 2020-01-08: qty 5

## 2020-01-08 MED ORDER — ALBUTEROL (5 MG/ML) CONTINUOUS INHALATION SOLN
10.0000 mg/h | INHALATION_SOLUTION | Freq: Once | RESPIRATORY_TRACT | Status: AC
  Administered 2020-01-08: 10 mg/h via RESPIRATORY_TRACT
  Filled 2020-01-08: qty 20

## 2020-01-08 MED ORDER — INSULIN ASPART 100 UNIT/ML ~~LOC~~ SOLN
5.0000 [IU] | Freq: Once | SUBCUTANEOUS | Status: AC
  Administered 2020-01-08: 5 [IU] via SUBCUTANEOUS

## 2020-01-08 MED ORDER — DULOXETINE HCL 60 MG PO CPEP
60.0000 mg | ORAL_CAPSULE | Freq: Every day | ORAL | Status: DC
  Administered 2020-01-09 – 2020-01-10 (×2): 60 mg via ORAL
  Filled 2020-01-08 (×2): qty 1

## 2020-01-08 MED ORDER — ROPINIROLE HCL 1 MG PO TABS
4.0000 mg | ORAL_TABLET | Freq: Two times a day (BID) | ORAL | Status: DC
  Administered 2020-01-08 – 2020-01-10 (×4): 4 mg via ORAL
  Filled 2020-01-08 (×5): qty 4

## 2020-01-08 MED ORDER — ONDANSETRON 4 MG PO TBDP: 4.0000 mg | ORAL_TABLET | Freq: Three times a day (TID) | ORAL | Status: DC | PRN

## 2020-01-08 MED ORDER — INSULIN GLARGINE 100 UNIT/ML ~~LOC~~ SOLN
40.0000 [IU] | Freq: Two times a day (BID) | SUBCUTANEOUS | Status: DC
  Administered 2020-01-08 – 2020-01-09 (×2): 40 [IU] via SUBCUTANEOUS
  Filled 2020-01-08 (×3): qty 0.4

## 2020-01-08 MED ORDER — ALBUTEROL SULFATE HFA 108 (90 BASE) MCG/ACT IN AERS
2.0000 | INHALATION_SPRAY | RESPIRATORY_TRACT | Status: DC | PRN
  Administered 2020-01-08: 2 via RESPIRATORY_TRACT
  Filled 2020-01-08: qty 6.7

## 2020-01-08 MED ORDER — IPRATROPIUM-ALBUTEROL 0.5-2.5 (3) MG/3ML IN SOLN
3.0000 mL | Freq: Four times a day (QID) | RESPIRATORY_TRACT | Status: DC | PRN
  Administered 2020-01-09: 3 mL via RESPIRATORY_TRACT
  Filled 2020-01-08: qty 3

## 2020-01-08 NOTE — Telephone Encounter (Signed)
Spoke to patient's spouse, Denny(DPR). Sharlet Salina stated that patient is currently at the ED for sx of sob and tightness. Patient PCP recommended ED eval.  Routing to Dr. Halford Chessman, as an [place].   Will close encounter as nothing further is needed on our end per [facility].

## 2020-01-08 NOTE — Telephone Encounter (Signed)
Noted. Thanks. Will await ER/inpatient notes.

## 2020-01-08 NOTE — H&P (Addendum)
History and Physical   Kathleen Hatfield SHF:[id number] DOB: [birth year] DOA: 01/08/2020  PCP: [name], MD  Patient coming from: Home  I have personally briefly reviewed patient's old medical records in [place].  Chief Concern: shortness of breath  HPI: Kathleen Hatfield is a 64 y.o. female with medical history significant for chronic diastolic heart failure, asthma, obstructive sleep apnea on CPAP, morbid obesity, hypertension, diabetes mellitus on insulin, hyperlipidemia.  Presented to the emergency department for chief concern of shortness of breath. She reports that her shortness of breath has been ongoing for the last 5 to 6 weeks. She reports that is progressively worsened. Her shortness of breath is brought on with minimal exertion, including holding onto her checkbook. She further endorses weakness of the bilateral upper extremity with any use of her muscles. She states that the symptoms have been ongoing for the last 5 weeks and today she just could not take it anymore. Symptoms present at rest as well.  At bedside she denied headache, vision changes, nausea, vomiting, abdominal pain, chest pain, fever, chills, cough, dysuria, hematuria, diarrhea, constipation, SI and HI. She endorses generalized pain all over and weakness of her upper extremity.  She endorses compliance with her asthma medication. However she states that she used her inhalers and nebulizers appropriately and still remained short of breath prompting her to present to the emergency department.  Social history: She lives with her boyfriend. Denies tobacco use, alcohol, and recreational drug use.  ED Course: Discussed with ED provider. Admit for asthma exacerbation.  Review of Systems: As per HPI otherwise 10 point review of systems negative.   Past Medical History:  Diagnosis Date  . Anemia   . Arthritis   . Asthma   . Chronic diastolic CHF 50/2774   Echocardiogram 05/2019: EF 70, no RWMA, mild LVH, Gr 1 DD,  normal RVSF, severe LVH, borderline asc Aorta (39 mm)  . CKD (chronic kidney disease)   . Depression   . Diabetes mellitus without complication ([place])   . Diverticulitis   . Dyspnea    with exertion  . GERD (gastroesophageal reflux disease)   . History of blood transfusion   . Hyperlipidemia    cannot tolerate statins  . Hypertension   . Nuclear stress test    Myoview 05/2019: EF 83, no ischemia or infarction; Low Risk  . Peripheral neuropathy   . Pneumonia   . PONV (postoperative nausea and vomiting)   . Sleep apnea    uses CPAP  . Ulcerative colitis ([place])    dr Collene Mares   Past Surgical History:  Procedure Laterality Date  . ABDOMINAL HYSTERECTOMY    . APPENDECTOMY    . BREAST BIOPSY    . BREAST SURGERY     left biopsy  . CARDIAC CATHETERIZATION    . CESAREAN SECTION    . CHOLECYSTECTOMY    . HERNIA REPAIR    . INSERTION OF MESH N/A 11/15/2015   Procedure: INSERTION OF MESH;  Surgeon: [name], MD;  Location: [facility];  Service: General;  Laterality: N/A;  . LAPAROSCOPIC LYSIS OF ADHESIONS N/A 11/15/2015   Procedure: LAPAROSCOPIC LYSIS OF ADHESIONS;  Surgeon: [name], MD;  Location: [facility];  Service: General;  Laterality: N/A;  . RIGHT HEART CATHETERIZATION N/A 02/27/2013   Procedure: RIGHT HEART CATH;  Surgeon: Blane Ohara, MD;  Location: [facility] CATH LAB;  Service: Cardiovascular;  Laterality: N/A;  . RIGHT HEART CATHETERIZATION N/A 12/14/2013   Procedure: RIGHT  HEART CATH;  Surgeon: Larey Dresser, MD;  Location[facility] C[facility] LAB;  Service: Cardiovascular;  Laterality: N/A;  . SIGMOIDECTOMY  2010   diverticular disease  . VENTRAL HERNIA REPAIR N/A 11/15/2015   Procedure: LAPAROSCOPIC VENTRAL WALL HERNIA REPAIR;  Surgeon: [name], MD;  Location: [facility];  Service: General;  Laterality: N/A;   Social History:  reports that she has never smoked. She has never used smokeless tobacco. She reports that she does not drink alcohol and does not use drugs.  Allergies   Allergen Reactions  . Almond Oil Anaphylaxis, Shortness Of Breath and Swelling  . Morphine And Related Shortness Of Breath and Other (See Comments)    Pt. States while in the hospital it affected her breathing, O2 dropped to the 70's  . Atorvastatin Other (See Comments)    Leg weakness  . Ceclor [Cefaclor] Nausea And Vomiting  . Ciprofloxacin Other (See Comments)    Makes joints and muscles ache  . Levaquin [Levofloxacin] Other (See Comments)    Body aches  . Losartan Other (See Comments)    Weakness   . Statins Other (See Comments)    Leg and body weakness  . Sulfa Antibiotics Nausea And Vomiting   Family History  Problem Relation Age of Onset  . Heart disease Mother   . Hypertension Mother   . Dementia Mother   . Heart disease Father   . COPD Father   . Hypertension Father   . Heart disease Brother   . Other Brother        knee replacement; hip replacement  . Other Brother        cant brathe when laying down  . Breast cancer Neg Hx   . Colon cancer Neg Hx    Family history: Family history reviewed and hypertension and heart disease in mother.  Prior to Admission medications   Medication Sig Start Date End Date Taking? Authorizing Provider  acetaminophen (TYLENOL) 500 MG tablet Take 1,000 mg by mouth every 6 (six) hours as needed for pain.    [provider]  albuterol (PROVENTIL HFA;VENTOLIN HFA) 108 (90 Base) MCG/ACT inhaler Inhale 2 puffs into the lungs every 4 (four) hours as needed for wheezing or shortness of breath.    [provider]  BAYER CONTOUR NEXT TEST test strip 1 each by Other route daily. As directed 07/30/15   [provider]  budesonide (PULMICORT) 0.5 MG/2ML nebulizer solution INHALE CONTENTS OF ONE NEBULE VIA NEBULIZER ONCE DAILY 11/10/19   [name], MD  buPROPion (WELLBUTRIN XL) 300 MG 24 hr tablet TAKE 1 TABLET BY MOUTH DAILY 09/28/19   [name], MD  Cholecalciferol (VITAMIN D-3) 5000 UNITS TABS Take 5,000  Units by mouth every other day.     [provider]  dicyclomine (BENTYL) 20 MG tablet TAKE 1 TABLET UP TO FOUR TIMES A DAY FOR GI CRAMPING, PAIN, AND NAUSEA/VOMITING AS NEEDED 11/13/19   [name], MD  DULoxetine (CYMBALTA) 60 MG capsule TAKE 1 CAPSULE BY MOUTH ONCE DAILY. 09/28/19   [name], MD  gabapentin (NEURONTIN) 100 MG capsule TAKE 3 CAPSULES BY MOUTH AT BEDTIME. 12/01/19   [name], MD  GLOBAL EASE INJECT PEN NEEDLES 32G X 4 MM MISC 1 each by Other route daily.  06/19/15   [provider]  insulin degludec (TRESIBA FLEXTOUCH) 200 UNIT/ML FlexTouch Pen Inject 88 Units into the skin daily. 09/04/19   [name], MD  ipratropium-albuterol (DUONEB)  0.5-2.5 (3) MG/3ML SOLN Take 37ms by nebulization every 12 hrs 05/25/19   [provider]  [place] each by Other route. As directed 07/30/15   [provider]  ondansetron (ZOFRAN ODT) 4 MG disintegrating tablet Take 1 tablet (4 mg total) by mouth every 8 (eight) hours as needed for nausea or vomiting. 03/13/19   YTasia Catchings Willetta York V, PA-C  pantoprazole (PROTONIX) 40 MG tablet Take 40 mg by mouth daily.  04/14/12   [provider]  polyethylene glycol (MIRALAX / GLYCOLAX) packet Take 17 g by mouth daily as needed for constipation.    [provider]  potassium chloride SA (KLOR-CON M20) 20 MEQ tablet Take 3 tablets (60 mEq total) by mouth 2 (two) times daily. 01/05/20   WRichardson DoppT, PA-C  propranolol (INDERAL) 10 MG tablet Take 1 tablet (10 mg total) by mouth 2 (two) times daily. 10/10/19   DTonia Ghent MD  rOPINIRole (REQUIP) 4 MG tablet TAKE 1 TABLET BY MOUTH TWICE DAILY 01/03/20   DTonia Ghent MD  spironolactone (ALDACTONE) 25 MG tablet TAKE 1 TABLET BY MOUTH DAILY 11/10/19   DTonia Ghent MD  torsemide (DEMADEX) 20 MG tablet Take 2 tablets (40 mg total) by mouth daily. 01/05/20   WRichardson DoppT, PA-C  traMADol (ULTRAM) 50 MG tablet Take 50 mg by mouth daily  as needed for pain.    [provider]  UNABLE TO FIND CPAP- At bedtime    [provider]  VICTOZA 18 MG/3ML SOPN INJECT 1.8MG UNDER THE SKIN DAILY Patient taking differently: Inject 1.8 mg into the skin daily.  11/10/19   DTonia Ghent MD   Physical Exam: Vitals:   01/08/20 1830 01/08/20 1845 01/08/20 1900 01/08/20 2022  BP: 114/71 135/78 130/82 (!) 144/76  Pulse: 86 87 91 97  Resp: 15 17 15 18   Temp:      TempSrc:      SpO2: 96% 93% 92% 92%  Weight:      Height:       Constitutional: NAD, calm, comfortable Eyes: PERRL, lids and conjunctivae normal ENMT: Mucous membranes are moist. Posterior pharynx clear of any exudate or lesions. age appropriate dentition.  Neck: normal, supple, no masses, no thyromegaly Respiratory: clear to auscultation bilaterally, no wheezing, no crackles. Normal respiratory effort. No accessory muscle use.  Cardiovascular: Regular rate and rhythm, no murmurs / rubs / gallops. No extremity edema. 2+ pedal pulses. No carotid bruits.  Abdomen: Morbidly obese abdomen, no tenderness, no masses palpated. No hepatosplenomegaly. Bowel sounds positive.  Musculoskeletal: no clubbing / cyanosis. No joint deformity upper and lower extremities. Good ROM, no contractures. Normal muscle tone. Tremors when she held onto her Styrofoam cup. Skin: no rashes, lesions, ulcers. No induration Neurologic: CN 2-12 grossly intact. Sensation intact. Strength 5/5 in all 4.  Psychiatric: Normal judgment and insight. Alert and oriented x 3.  Anxious mood  Labs on Admission: I have personally reviewed following labs and imaging studies  CBC: Recent Labs  Lab 01/08/20 1229  WBC 11.7*  HGB 14.5  HCT 44.7  MCV 91.2  PLT 3782  Basic Metabolic Panel: Recent Labs  Lab 01/08/20 1229  NA 135  K 4.1  CL 88*  CO2 32  GLUCOSE 328*  BUN 22  CREATININE 1.23*  CALCIUM 10.0   BNP (last 3 results) Recent Labs    05/10/19 1644  PROBNP 118   HbA1C: No results  for input(s): HGBA1C in the last  72 hours.  CBG: Recent Labs  Lab 01/08/20 1700  GLUCAP 258*   Urine analysis:    Component Value Date/Time   COLORURINE STRAW (A) 03/12/2019 1906   APPEARANCEUR CLEAR 03/12/2019 1906   LABSPEC 1.009 03/12/2019 1906   PHURINE 6.0 03/12/2019 1906   GLUCOSEU 150 (A) 03/12/2019 1906   HGBUR NEGATIVE 03/12/2019 1906   BILIRUBINUR negative 12/25/2019 1515   KETONESUR NEGATIVE 03/12/2019 1906   PROTEINUR Positive (A) 12/25/2019 1515   PROTEINUR NEGATIVE 03/12/2019 1906   UROBILINOGEN negative (A) 12/25/2019 1515   UROBILINOGEN 0.2 09/19/2008 1400   NITRITE negative 12/25/2019 1515   NITRITE NEGATIVE 03/12/2019 1906   LEUKOCYTESUR Large (3+) (A) 12/25/2019 1515   LEUKOCYTESUR NEGATIVE 03/12/2019 1906   Radiological Exams on Admission: Personally reviewed and I agree with radiologist reading as below.  DG Chest 2 View  Result Date: 01/08/2020 CLINICAL DATA:  Shortness of breath. EXAM: CHEST - 2 VIEW COMPARISON:  Prior chest radiographs 02/01/2019. FINDINGS: Heart size within normal limits. There is no appreciable airspace consolidation. No evidence of pleural effusion or pneumothorax. No acute bony abnormality identified. IMPRESSION: No evidence of active cardiopulmonary disease. Electronically Signed   By: Kellie Simmering DO   On: 01/08/2020 13:12   EKG: Independently reviewed, showing sinus rhythm with low amplitude. Right bundle branch block present on previous EKG.  Assessment/Plan  Principal Problem:   Asthma exacerbation Active Problems:   Hypertension   Depression   Hyperlipidemia   Ulcerative colitis (HCC)   Fatigue   Morbid obesity (HCC)   Muscle weakness   Grade I diastolic dysfunction   Asthma exacerbation-she received Solu-Medrol 125 mg in the ED, Albuterol nebs, ipratropium inhaler 2 puff, and magnesium IV -With ambulation she desatted to the 89% -Admit to observation with telemetry -Transition of care consulted for oxygen  DME's -Duo nebs every 6 hours as needed for wheezing shortness of breath ordered Pulmicort nebulizer ordered Albuterol inhalation as needed for wheezing shortness of breath ordered  Bilateral upper extremity weakness with any use of muscle and any flexion of the arms -Concern for dermatomyositis versus polymyositis -Checking aldolase and ANA and CK -Patient would benefit from outpatient referral to rheumatology for further testing of autoimmune antibodies  Ulcerative colitis-status post sigmoid resection, does not appear to be in acute flare at this time -Follow-up with her GI outpatient  Anxiety/depression with likely components of fibromyalgia -Resumed home bupropion, duloxetine, and tramadol as needed for pain  Hypertension-resumed home propranolol 10 mg twice daily, spironolactone 25 mg daily, torsemide 40 mg daily  Insulin-dependent diabetes mellitus-on home Victoza and insulin degludec 88 units daily -Glargine 40 units twice daily inpatient -SSI -Heart healthy and carb modified diet  Obstructive sleep apnea-suspect obesity hypo ventilation syndrome -CPAP nightly ordered -Educated patient that she needs to be using her CPAP machine at least 6 hours continuously daily for effectiveness  DVT prophylaxis: Enoxaparin and TED hose Code Status: Full code Diet: Heart healthy carb modified Family Communication: None at this time Disposition Plan: Pending clinical course Consults called: None at this time Admission status: Observation with telemetry  Macauley Mossberg N Vaun Hyndman D.O. [facility]  If 7AM-7PM, please contact day-coverage provider [email or web address]  01/08/2020, 8:56 PM

## 2020-01-08 NOTE — ED Notes (Signed)
Pt given a sandwich bag and an ice water per her request

## 2020-01-08 NOTE — Telephone Encounter (Signed)
[facility] Day - Client TELEPHONE ADVICE RECORD [facility] Patient Name: Kathleen Hatfield Gender: Female DOB: 10-10-1955 Age: 64 Y 11 M 26 D Return Phone Number: [phone] (Primary) Address: City/State/Zip: [place] [address] Client [facility] - Client Client Site [place] - Day Contact Type Call Who Is Calling Patient / Member / Family / Caregiver Call Type Triage / Clinical Relationship To Patient Self Return Phone Number [phone] (Primary) Chief Complaint BREATHING - shortness of breath or sounds breathless Reason for Call Symptomatic / Request for [place] states that she is having difficulty breathing and a cough. [place] Not Listed [facility] Translation No Nurse Assessment Nurse: Vallery Sa, RN, [place] Date/Time (Eastern Time): 01/08/2020 11:13:36 AM Confirm and document reason for call. If symptomatic, describe symptoms. ---Kathleen Hatfield states she developed sore throat, headache, muscle aches, low grade fever, cough and cold symptoms about 3 days ago. No severe breathing difficulty or blueness around her lips. She developed worsening shortness of breath with walking several days ago. She has chest tightness again today that she rates as a 5-9 on the 1 to 10 scale. Alert and responsive. (possible COVID concerns due to symptoms and being in pubic places) Does the patient have any new or worsening symptoms? ---Yes Will a triage be completed? ---Yes Related visit to physician within the last 2 weeks? ---No Does the PT have any chronic conditions? (i.e. diabetes, asthma, this includes High risk factors for pregnancy, etc.) ---Yes List chronic conditions. ---Diabetes, Asthma, COPD, Heart rate problems Is this a behavioral health or substance abuse call? ---No Guidelines Guideline Title Affirmed Question Affirmed Notes Nurse Date/Time (Eastern Time) COVID-19 -  Diagnosed or Suspected MILD difficulty breathing (e.g., minimal/no SOB at rest, SOB with walking, pulse <100) [place], RN, Cathy 01/08/2020 11:17:53 AM Disp. Time Eilene Ghazi Time) Disposition Final User 01/08/2020 11:08:40 AM Send to Urgent Jennelle Human, Ralene Bathe NOTE: All timestamps contained within this report are represented as [place] Standard Time. CONFIDENTIALTY NOTICE: This fax transmission is intended only for the addressee. It contains information that is legally privileged, confidential or otherwise protected from use or disclosure. If you are not the intended recipient, you are strictly prohibited from reviewing, disclosing, copying using or disseminating any of this information or taking any action in reliance on or regarding this information. If you have received this fax in error, please notify us immediately by telephone so that we can arrange for its return to [place]. Phone: [phone], Toll-Free: [phone], Fax: [phone] Page: 2 of 2 Call Id: [id number] 01/08/2020 11:20:23 AM Go to ED Now (or PCP triage) Yes Vallery Sa, RN, Rosey Bath Disagree/Comply Comply Caller Understands Yes PreDisposition Call Doctor Care Advice Given Per Guideline GO TO ED NOW (OR PCP TRIAGE): * IF NO PCP (PRIMARY CARE PROVIDER) SECOND-LEVEL TRIAGE: You need to be seen within the next hour. Go to the [facility] at _____________ [place] as soon as you can. CARE ADVICE given per COVID-19 - DIAGNOSED OR SUSPECTED (Adult) guideline. Referrals GO TO FACILITY OTHER - SPECIFY

## 2020-01-08 NOTE — ED Provider Notes (Signed)
Care of the patient was assumed from B. Rona Ravens PA-C at 83; see this provider''s note for complete history of present illness, review of systems, and physical exam.  Briefly, the patient is a 64 y.o. female who presented to the ED with shortness of breath.  History significant for asthma, CHF, CKD, diabetes, hypertension.  Has been vaccinated with [facility].  Patient's work-up reveals normal chest x-ray, normal EKG.  BMP normal.  CBC and CMP unremarkable, small leukocytosis of 11.7.  Previous provider reevaluated patient after albuterol, mag, Solu-Medrol and patient was still wheezing with ambulatory desaturation of 88%.  Does not wear oxygen at home.  Plan at time of handoff:  Reevaluate after continuous Neb.     Physical Exam  BP 127/68   Pulse 74   Temp 98.2 F (36.8 C) (Oral)   Resp 19   Ht 5' 5"  (1.651 m)   Wt 118.8 kg   SpO2 99%   BMI 43.58 kg/m   Physical Exam Constitutional:      General: She is not in acute distress.    Appearance: Normal appearance. She is not ill-appearing, toxic-appearing or diaphoretic.  Cardiovascular:     Rate and Rhythm: Normal rate and regular rhythm.     Pulses: Normal pulses.  Pulmonary:     Effort: Tachypnea and accessory muscle usage present.     Breath sounds: Wheezing present.  Musculoskeletal:        General: Normal range of motion.  Skin:    General: Skin is warm and dry.     Capillary Refill: Capillary refill takes less than 2 seconds.  Neurological:     General: No focal deficit present.     Mental Status: She is alert and oriented to person, place, and time.  Psychiatric:        Mood and Affect: Mood normal.        Behavior: Behavior normal.        Thought Content: Thought content normal.     ED Course/Procedures     Procedures  MDM    Upon reevaluation after DuoNeb, patient states that she feels slightly better, however feels tight still..  Lung exam with wheezes heard throughout.  Upon ambulation patient  desatted to 90%, however stated that she could not catch her breath and needed to stop, was tachypneic to 30s.  Shared decision-making about patient coming in at this time due to her continuous shortness of breath for her asthma exacerbation, patient states that she does want to come in, does not feel safe going home with her shortness of breath.  Will consult hospitalist at this time.  70 Spoke to Dr. Tobie Poet, [facility] who will accept the patient.  The patient appears reasonably stabilized for admission considering the current resources, flow, and capabilities available in the ED at this time, and I doubt any other [facility] requiring further screening and/or treatment in the ED prior to admission.     Alfredia Client, PA-C 01/08/20 1816    Blanchie Dessert, MD 01/08/20 2022

## 2020-01-08 NOTE — ED Triage Notes (Signed)
Pt reports sob for a few weeks but worse the past 3 days. Pt had an MRI of her heart last week with normal results. No distress noted in triage. Pt has been fully vaccinated for COVID

## 2020-01-08 NOTE — Telephone Encounter (Signed)
Per chart review tab pt is at [place]. 

## 2020-01-08 NOTE — ED Notes (Signed)
Pt ambulated around the room and into the hallway. Pt was on room air. Pt's oxygen saturations dropped to 89% while walking. Once pt sat back down and got situated in the bed pt's oxygen saturation returned to 95%. Will continue to monitor pt.

## 2020-01-08 NOTE — ED Notes (Signed)
Pt remained at 91% with a HR of 98bpm. Pt became very winded, pt was told that if she needed to stop and catch her breath she can do so. Pt stated "I will at the corner" pt was able to make it back to her room with RR of about 30.

## 2020-01-08 NOTE — ED Provider Notes (Addendum)
[facility] EMERGENCY DEPARTMENT Provider Note   CSN: [id number] Arrival date & time: 01/08/20  1214     History Chief Complaint  Patient presents with   Shortness of Breath    Kathleen Hatfield is a 64 y.o. female.  The history is provided by the patient and medical records. No language interpreter was used.  Shortness of Breath    64 year old female significant history of CHF, CKD, diabetes, hypertension presenting for evaluation of shortness of breath.  Patient report for the past month she has had progressive worsening shortness of breath.  States she is symptomatic even with minimal exertion.  States sometimes turning her head or sitting up caused her to have significant shortness of breath.  She feels her chest is tight and wheezy.  She tries using a rescue inhaler every 4-6 hours with now not adequate improvement.  She felt she is gaining some fluid within the past few days, is now having some nonproductive cough as well as loss of taste smell for the past 3 days.  She tries a home Covid test that came back negative.  She has been vaccinated with [employer] vaccine in the springtime.  She does not complain of any fever or chills no active chest pain no congestion.  She normally sleeps upright.  No recent medication changes.  She denies alcohol or tobacco use.  Past Medical History:  Diagnosis Date   Anemia    Arthritis    Asthma    Chronic diastolic CHF 79/0240   Echocardiogram 05/2019: EF 70, no RWMA, mild LVH, Gr 1 DD, normal RVSF, severe LVH, borderline asc Aorta (39 mm)   CKD (chronic kidney disease)    Depression    Diabetes mellitus without complication (HCC)    Diverticulitis    Dyspnea    with exertion   GERD (gastroesophageal reflux disease)    History of blood transfusion    Hyperlipidemia    cannot tolerate statins   Hypertension    Nuclear stress test    Myoview 05/2019: EF 83, no ischemia or infarction; Low Risk    Peripheral neuropathy    Pneumonia    PONV (postoperative nausea and vomiting)    Sleep apnea    uses CPAP   Ulcerative colitis [facility])    dr Collene Mares    Patient Active Problem List   Diagnosis Date Noted   Statin myopathy 10/10/2019   Advance care planning 06/14/2019   RLQ abdominal pain 06/14/2019   COPD (chronic obstructive pulmonary disease) ([place]) 06/14/2019   Leukocytosis 06/14/2019   Hypercalcemia 06/14/2019   Gout 06/14/2019   COPD with acute exacerbation ([place]) 05/03/2017   Acute hypoxemic respiratory failure ([place]) 04/03/2016   Acute renal failure superimposed on stage 2 chronic kidney disease ([place]) 04/02/2016   Diabetes mellitus without complication (HCC)    CKD (chronic kidney disease)    Hypersomnia with sleep apnea 09/16/2015   Fatigue 06/07/2015   Morbid obesity ([place]) 06/07/2015   Chronic diastolic heart failure (HCC)    OSA (obstructive sleep apnea) 08/23/2012   Hypertension    Depression    GERD (gastroesophageal reflux disease)    Hyperlipidemia    Ulcerative colitis ([place])    Patellar tendinitis 05/25/2011   Knee pain 05/25/2011   Myofascial pain 05/25/2011   Cervicalgia 05/25/2011    Past Surgical History:  Procedure Laterality Date   ABDOMINAL HYSTERECTOMY     APPENDECTOMY     BREAST BIOPSY     BREAST SURGERY  left biopsy   CARDIAC CATHETERIZATION     CESAREAN SECTION     CHOLECYSTECTOMY     HERNIA REPAIR     INSERTION OF MESH N/A 11/15/2015   Procedure: INSERTION OF MESH;  Surgeon: [name], MD;  Location: [facility] ORS;  Service: General;  Laterality: N/A;   LAPAROSCOPIC LYSIS OF ADHESIONS N/A 11/15/2015   Procedure: LAPAROSCOPIC LYSIS OF ADHESIONS;  Surgeon: [name], MD;  Location: [facility] ORS;  Service: General;  Laterality: N/A;   RIGHT HEART CATHETERIZATION N/A 02/27/2013   Procedure: RIGHT HEART CATH;  Surgeon: Blane Ohara, MD;  Location: [facility] CATH LAB;  Service: Cardiovascular;  Laterality: N/A;    RIGHT HEART CATHETERIZATION N/A 12/14/2013   Procedure: RIGHT HEART CATH;  Surgeon: Larey Dresser, MD;  Location: [facility] CATH LAB;  Service: Cardiovascular;  Laterality: N/A;   SIGMOIDECTOMY  2010   diverticular disease   VENTRAL HERNIA REPAIR N/A 11/15/2015   Procedure: LAPAROSCOPIC VENTRAL WALL HERNIA REPAIR;  Surgeon: [name], MD;  Location: [facility] ORS;  Service: General;  Laterality: N/A;     OB History   No obstetric history on file.     Family History  Problem Relation Age of Onset   Heart disease Mother    Hypertension Mother    Dementia Mother    Heart disease Father    COPD Father    Hypertension Father    Heart disease Brother    Other Brother        knee replacement; hip replacement   Other Brother        cant brathe when laying down   Breast cancer Neg Hx    Colon cancer Neg Hx     Social History   Tobacco Use   Smoking status: Never Smoker   Smokeless tobacco: Never Used  Vaping Use   Vaping Use: Never used  Substance Use Topics   Alcohol use: No   Drug use: No    Home Medications Prior to Admission medications   Medication Sig Start Date End Date Taking? Authorizing Provider  acetaminophen (TYLENOL) 500 MG tablet Take 1,000 mg by mouth every 6 (six) hours as needed for pain.    [provider]  albuterol (PROVENTIL HFA;VENTOLIN HFA) 108 (90 Base) MCG/ACT inhaler Inhale 2 puffs into the lungs every 4 (four) hours as needed for wheezing or shortness of breath.    [provider]  BAYER CONTOUR NEXT TEST test strip 1 each by Other route daily. As directed 07/30/15   [provider]  budesonide (PULMICORT) 0.5 MG/2ML nebulizer solution INHALE CONTENTS OF ONE NEBULE VIA NEBULIZER ONCE DAILY 11/10/19   [name], MD  buPROPion (WELLBUTRIN XL) 300 MG 24 hr tablet TAKE 1 TABLET BY MOUTH DAILY 09/28/19   [name], MD  Cholecalciferol (VITAMIN D-3) 5000 UNITS TABS Take 5,000 Units by mouth every other day.      [provider]  dicyclomine (BENTYL) 20 MG tablet TAKE 1 TABLET UP TO FOUR TIMES A DAY FOR GI CRAMPING, PAIN, AND NAUSEA/VOMITING AS NEEDED 11/13/19   [name], MD  DULoxetine (CYMBALTA) 60 MG capsule TAKE 1 CAPSULE BY MOUTH ONCE DAILY. 09/28/19   [name], MD  gabapentin (NEURONTIN) 100 MG capsule TAKE 3 CAPSULES BY MOUTH AT BEDTIME. 12/01/19   [name], MD  GLOBAL EASE INJECT PEN NEEDLES 32G X 4 MM MISC 1 each by Other route daily.  06/19/15   [provider]  insulin degludec (  TRESIBA FLEXTOUCH) 200 UNIT/ML FlexTouch Pen Inject 88 Units into the skin daily. 09/04/19   [name], MD  ipratropium-albuterol (DUONEB) 0.5-2.5 (3) MG/3ML SOLN Take 73ms by nebulization every 12 hrs 05/25/19   [provider]  [place]1 each by Other route. As directed 07/30/15   [provider]  ondansetron (ZOFRAN ODT) 4 MG disintegrating tablet Take 1 tablet (4 mg total) by mouth every 8 (eight) hours as needed for nausea or vomiting. 03/13/19   YTasia Catchings Amy V, PA-C  pantoprazole (PROTONIX) 40 MG tablet Take 40 mg by mouth daily.  04/14/12   [provider]  polyethylene glycol (MIRALAX / GLYCOLAX) packet Take 17 g by mouth daily as needed for constipation.    [provider]  potassium chloride SA (KLOR-CON M20) 20 MEQ tablet Take 3 tablets (60 mEq total) by mouth 2 (two) times daily. 01/05/20   WRichardson DoppT, PA-C  propranolol (INDERAL) 10 MG tablet Take 1 tablet (10 mg total) by mouth 2 (two) times daily. 10/10/19   DTonia Ghent MD  rOPINIRole (REQUIP) 4 MG tablet TAKE 1 TABLET BY MOUTH TWICE DAILY 01/03/20   DTonia Ghent MD  spironolactone (ALDACTONE) 25 MG tablet TAKE 1 TABLET BY MOUTH DAILY 11/10/19   DTonia Ghent MD  torsemide (DEMADEX) 20 MG tablet Take 2 tablets (40 mg total) by mouth daily. 01/05/20   WRichardson DoppT, PA-C  traMADol (ULTRAM) 50 MG tablet Take 50 mg by mouth daily as needed for pain.    [provider]  UNABLE TO FIND CPAP at bedtime    [provider]  VICTOZA 18 MG/3ML SOPN INJECT 1.8MG UNDER THE SKIN DAILY 11/10/19   DTonia Ghent MD    Allergies    Almond oil, Morphine and related, Atorvastatin, Ceclor [cefaclor], Ciprofloxacin, Levaquin [levofloxacin], Losartan, Statins, and Sulfa antibiotics  Review of Systems   Review of Systems  Respiratory: Positive for shortness of breath.   All other systems reviewed and are negative.   Physical Exam Updated Vital Signs BP (!) 141/85    Pulse 84    Temp 98.2 F (36.8 C) (Oral)    Resp 18    Ht 5' 5"  (1.651 m)    Wt 118.8 kg    SpO2 94%    BMI 43.58 kg/m   Physical Exam Vitals and nursing note reviewed.  Constitutional:      General: She is not in acute distress.    Appearance: She is well-developed. She is obese.     Comments: Obese female appears in mild respiratory discomfort sitting upright.  HENT:     Head: Atraumatic.  Eyes:     Conjunctiva/sclera: Conjunctivae normal.  Pulmonary:     Effort: Tachypnea present.     Breath sounds: No stridor. Decreased breath sounds present. No wheezing, rhonchi or rales.  Chest:     Chest wall: No tenderness.  Abdominal:     Palpations: Abdomen is soft.     Tenderness: There is no abdominal tenderness.  Musculoskeletal:     Cervical back: Neck supple.     Right lower leg: No edema.     Left lower leg: No edema.  Skin:    Findings: No rash.  Neurological:     Mental Status: She is alert and oriented to person, place, and time.  Psychiatric:        Mood and Affect: Mood normal.     ED Results / Procedures / Treatments  Labs (all labs ordered are listed, but only abnormal results are displayed) Labs Reviewed  BASIC METABOLIC PANEL - Abnormal; Notable for the following components:      Result Value   Chloride 88 (*)    Glucose, Bld 328 (*)    Creatinine, Ser 1.23 (*)    GFR, Estimated 49 (*)    All other components within normal limits  CBC -  Abnormal; Notable for the following components:   WBC 11.7 (*)    RDW 15.6 (*)    All other components within normal limits  RESPIRATORY PANEL BY RT PCR (FLU A&B, COVID)  BRAIN NATRIURETIC PEPTIDE    EKG None  ED ECG REPORT   Date: 01/08/2020  Rate: 73  Rhythm: normal sinus rhythm  QRS Axis: normal  Intervals: PR prolonged  ST/T Wave abnormalities: normal  Conduction Disutrbances:right bundle branch block  Narrative Interpretation:   Old EKG Reviewed: none available  I have personally reviewed the EKG tracing and agree with the computerized printout as noted.   Radiology DG Chest 2 View  Result Date: 01/08/2020 CLINICAL DATA:  Shortness of breath. EXAM: CHEST - 2 VIEW COMPARISON:  Prior chest radiographs 02/01/2019. FINDINGS: Heart size within normal limits. There is no appreciable airspace consolidation. No evidence of pleural effusion or pneumothorax. No acute bony abnormality identified. IMPRESSION: No evidence of active cardiopulmonary disease. Electronically Signed   By: Kellie Simmering DO   On: 01/08/2020 13:12    Procedures Procedures (including critical care time)  Medications Ordered in ED Medications  albuterol (VENTOLIN HFA) 108 (90 Base) MCG/ACT inhaler 2 puff (2 puffs Inhalation Given 01/08/20 1350)  insulin aspart (novoLOG) injection 5 Units (has no administration in time range)  albuterol (PROVENTIL,VENTOLIN) solution continuous neb (has no administration in time range)  ipratropium (ATROVENT HFA) inhaler 2 puff (2 puffs Inhalation Given 01/08/20 1349)  methylPREDNISolone sodium succinate (SOLU-MEDROL) 125 mg/2 mL injection 125 mg (125 mg Intravenous Given 01/08/20 1350)  magnesium sulfate IVPB 2 g 50 mL (0 g Intravenous Stopped 01/08/20 1500)    ED Course  I have reviewed the triage vital signs and the nursing notes.  Pertinent labs & imaging results that were available during my care of the patient were reviewed by me and considered in my medical decision  making (see chart for details).    MDM Rules/Calculators/A&P                          BP 127/68    Pulse 74    Temp 98.2 F (36.8 C) (Oral)    Resp 19    Ht 5' 5"  (1.651 m)    Wt 118.8 kg    SpO2 99%    BMI 43.58 kg/m   Final Clinical Impression(s) / ED Diagnoses Final diagnoses:  None    Rx / DC Orders ED Discharge Orders    None     1:41 PM Patient here with progressive worsening shortness of breath especially with any kind of exertion.  She was initially 94% on room air but was placed on a nasal cannula upon arrival to the room.  She is tachypneic on exam with decreased breath sounds throughout.  Does have history of asthma and CHF.  She does not appear to be fluid overload.  She is fully vaccinated for COVID-19 however she did report having increasing cough as well as loss of taste and smell for the past several days therefore will recheck COVID-19 test.  Patient given albuterol, Atrovent, Solu-Medrol and magnesium.  Work-up initiated.  3:45 PM After initial treatment, on a reexamination, lung sounds more noticeable and patient felt a bit better.  When ambulate O2 sats dropped down to 89%.  Covid test came back negative.  Will perform hour-long nebs.  Patient signed out to oncoming provider who will reassess patient after treatment and if no significant improvement patient may benefit from admission for further care of suspect asthma or CHF exacerbation.  CBG 328. Normal anion gap.  5 unit of insulin given.  BNP normal, labs are otherwise reassuring.  CXR unremarkable.    Domenic Moras, PA-C 01/08/20 1548    Domenic Moras, PA-C 01/08/20 1550    Gareth Morgan, MD 01/08/20 1705

## 2020-01-09 ENCOUNTER — Encounter (HOSPITAL_COMMUNITY): Payer: Self-pay | Admitting: Family Medicine

## 2020-01-09 DIAGNOSIS — I5032 Chronic diastolic (congestive) heart failure: Secondary | ICD-10-CM | POA: Diagnosis not present

## 2020-01-09 DIAGNOSIS — J45901 Unspecified asthma with (acute) exacerbation: Secondary | ICD-10-CM | POA: Diagnosis present

## 2020-01-09 DIAGNOSIS — Z20822 Contact with and (suspected) exposure to covid-19: Secondary | ICD-10-CM | POA: Diagnosis not present

## 2020-01-09 DIAGNOSIS — J9601 Acute respiratory failure with hypoxia: Secondary | ICD-10-CM | POA: Diagnosis not present

## 2020-01-09 DIAGNOSIS — Z7951 Long term (current) use of inhaled steroids: Secondary | ICD-10-CM | POA: Diagnosis not present

## 2020-01-09 DIAGNOSIS — Z79899 Other long term (current) drug therapy: Secondary | ICD-10-CM | POA: Diagnosis not present

## 2020-01-09 DIAGNOSIS — F419 Anxiety disorder, unspecified: Secondary | ICD-10-CM | POA: Diagnosis present

## 2020-01-09 DIAGNOSIS — E785 Hyperlipidemia, unspecified: Secondary | ICD-10-CM | POA: Diagnosis not present

## 2020-01-09 DIAGNOSIS — Z825 Family history of asthma and other chronic lower respiratory diseases: Secondary | ICD-10-CM | POA: Diagnosis not present

## 2020-01-09 DIAGNOSIS — Z8249 Family history of ischemic heart disease and other diseases of the circulatory system: Secondary | ICD-10-CM | POA: Diagnosis not present

## 2020-01-09 DIAGNOSIS — Z794 Long term (current) use of insulin: Secondary | ICD-10-CM | POA: Diagnosis not present

## 2020-01-09 DIAGNOSIS — M797 Fibromyalgia: Secondary | ICD-10-CM | POA: Diagnosis present

## 2020-01-09 DIAGNOSIS — E1122 Type 2 diabetes mellitus with diabetic chronic kidney disease: Secondary | ICD-10-CM | POA: Diagnosis present

## 2020-01-09 DIAGNOSIS — I130 Hypertensive heart and chronic kidney disease with heart failure and stage 1 through stage 4 chronic kidney disease, or unspecified chronic kidney disease: Secondary | ICD-10-CM | POA: Diagnosis not present

## 2020-01-09 DIAGNOSIS — F32A Depression, unspecified: Secondary | ICD-10-CM | POA: Diagnosis present

## 2020-01-09 DIAGNOSIS — E1165 Type 2 diabetes mellitus with hyperglycemia: Secondary | ICD-10-CM | POA: Diagnosis not present

## 2020-01-09 DIAGNOSIS — G4733 Obstructive sleep apnea (adult) (pediatric): Secondary | ICD-10-CM | POA: Diagnosis not present

## 2020-01-09 DIAGNOSIS — K219 Gastro-esophageal reflux disease without esophagitis: Secondary | ICD-10-CM | POA: Diagnosis present

## 2020-01-09 DIAGNOSIS — Z885 Allergy status to narcotic agent status: Secondary | ICD-10-CM | POA: Diagnosis not present

## 2020-01-09 DIAGNOSIS — J4531 Mild persistent asthma with (acute) exacerbation: Secondary | ICD-10-CM | POA: Diagnosis not present

## 2020-01-09 DIAGNOSIS — Z888 Allergy status to other drugs, medicaments and biological substances status: Secondary | ICD-10-CM | POA: Diagnosis not present

## 2020-01-09 DIAGNOSIS — Z882 Allergy status to sulfonamides status: Secondary | ICD-10-CM | POA: Diagnosis not present

## 2020-01-09 DIAGNOSIS — R69 Illness, unspecified: Secondary | ICD-10-CM | POA: Diagnosis not present

## 2020-01-09 DIAGNOSIS — N1831 Chronic kidney disease, stage 3a: Secondary | ICD-10-CM | POA: Diagnosis present

## 2020-01-09 LAB — CBG MONITORING, ED
Glucose-Capillary: 383 mg/dL — ABNORMAL HIGH (ref 70–99)
Glucose-Capillary: 426 mg/dL — ABNORMAL HIGH (ref 70–99)

## 2020-01-09 LAB — BASIC METABOLIC PANEL
Anion gap: 16 — ABNORMAL HIGH (ref 5–15)
Anion gap: 14 (ref 5–15)
BUN: 32 mg/dL — ABNORMAL HIGH (ref 8–23)
BUN: 36 mg/dL — ABNORMAL HIGH (ref 8–23)
CO2: 29 mmol/L (ref 22–32)
CO2: 31 mmol/L (ref 22–32)
Calcium: 9.9 mg/dL (ref 8.9–10.3)
Calcium: 9.9 mg/dL (ref 8.9–10.3)
Chloride: 86 mmol/L — ABNORMAL LOW (ref 98–111)
Chloride: 86 mmol/L — ABNORMAL LOW (ref 98–111)
Creatinine, Ser: 1.45 mg/dL — ABNORMAL HIGH (ref 0.44–1.00)
Creatinine, Ser: 1.46 mg/dL — ABNORMAL HIGH (ref 0.44–1.00)
GFR, Estimated: 40 mL/min — ABNORMAL LOW (ref >60)
GFR, Estimated: 40 mL/min — ABNORMAL LOW (ref >60)
Glucose, Bld: 398 mg/dL — ABNORMAL HIGH (ref 70–99)
Glucose, Bld: 432 mg/dL — ABNORMAL HIGH (ref 70–99)
Potassium: 4 mmol/L (ref 3.5–5.1)
Potassium: 3.9 mmol/L (ref 3.5–5.1)
Sodium: 131 mmol/L — ABNORMAL LOW (ref 135–145)
Sodium: 131 mmol/L — ABNORMAL LOW (ref 135–145)

## 2020-01-09 LAB — CBC
HCT: 43.9 % (ref 36.0–46.0)
Hemoglobin: 14.3 g/dL (ref 12.0–15.0)
MCH: 29.1 pg (ref 26.0–34.0)
MCHC: 32.6 g/dL (ref 30.0–36.0)
MCV: 89.4 fL (ref 80.0–100.0)
Platelets: 322 10*3/uL (ref 150–400)
RBC: 4.91 MIL/uL (ref 3.87–5.11)
RDW: 15.5 % (ref 11.5–15.5)
WBC: 12 10*3/uL — ABNORMAL HIGH (ref 4.0–10.5)
nRBC: 0 % (ref 0.0–0.2)

## 2020-01-09 LAB — CK: Total CK: 67 U/L (ref 38–234)

## 2020-01-09 LAB — GLUCOSE, CAPILLARY
Glucose-Capillary: 423 mg/dL — ABNORMAL HIGH (ref 70–99)
Glucose-Capillary: 385 mg/dL — ABNORMAL HIGH (ref 70–99)
Glucose-Capillary: 408 mg/dL — ABNORMAL HIGH (ref 70–99)

## 2020-01-09 LAB — HIV ANTIBODY (ROUTINE TESTING W REFLEX): HIV Screen 4th Generation wRfx: NONREACTIVE

## 2020-01-09 LAB — HEMOGLOBIN A1C
Hgb A1c MFr Bld: 9.2 % — ABNORMAL HIGH (ref 4.8–5.6)
Mean Plasma Glucose: 217.34 mg/dL

## 2020-01-09 MED ORDER — INSULIN ASPART 100 UNIT/ML ~~LOC~~ SOLN
0.0000 [IU] | Freq: Three times a day (TID) | SUBCUTANEOUS | Status: DC
  Administered 2020-01-09: 20 [IU] via SUBCUTANEOUS
  Administered 2020-01-10: 15 [IU] via SUBCUTANEOUS

## 2020-01-09 MED ORDER — INSULIN ASPART 100 UNIT/ML ~~LOC~~ SOLN
6.0000 [IU] | Freq: Three times a day (TID) | SUBCUTANEOUS | Status: DC
  Administered 2020-01-09 – 2020-01-10 (×2): 6 [IU] via SUBCUTANEOUS

## 2020-01-09 MED ORDER — INSULIN ASPART 100 UNIT/ML ~~LOC~~ SOLN: 0.0000 [IU] | Freq: Every day | SUBCUTANEOUS | Status: DC

## 2020-01-09 MED ORDER — INSULIN ASPART 100 UNIT/ML ~~LOC~~ SOLN
10.0000 [IU] | Freq: Once | SUBCUTANEOUS | Status: AC
  Administered 2020-01-09: 10 [IU] via SUBCUTANEOUS

## 2020-01-09 MED ORDER — INSULIN GLARGINE 100 UNIT/ML ~~LOC~~ SOLN
10.0000 [IU] | Freq: Once | SUBCUTANEOUS | Status: AC
  Administered 2020-01-09: 10 [IU] via SUBCUTANEOUS
  Filled 2020-01-09: qty 0.1

## 2020-01-09 MED ORDER — IPRATROPIUM-ALBUTEROL 0.5-2.5 (3) MG/3ML IN SOLN
3.0000 mL | Freq: Four times a day (QID) | RESPIRATORY_TRACT | Status: DC
  Administered 2020-01-09 – 2020-01-10 (×3): 3 mL via RESPIRATORY_TRACT
  Filled 2020-01-09 (×3): qty 3

## 2020-01-09 MED ORDER — INSULIN GLARGINE 100 UNIT/ML ~~LOC~~ SOLN
50.0000 [IU] | Freq: Two times a day (BID) | SUBCUTANEOUS | Status: DC
  Administered 2020-01-09: 50 [IU] via SUBCUTANEOUS
  Filled 2020-01-09 (×4): qty 0.5

## 2020-01-09 NOTE — Progress Notes (Addendum)
PROGRESS NOTE    Kathleen Hatfield  STM:[id number] DOB: Aug 24, 1955 DOA: 01/08/2020 PCP: [name], MD   Brief Narrative:  Kathleen Hatfield is a 64 y.o. female with medical history significant for chronic diastolic heart failure, asthma, obstructive sleep apnea on CPAP, morbid obesity, hypertension, diabetes mellitus on insulin, hyperlipidemia.  Presented to the emergency department for chief concern of shortness of breath. She reports that her shortness of breath has been ongoing for the last 5 to 6 weeks. She reports that is progressively worsened. Her shortness of breath is brought on with minimal exertion, including holding onto her checkbook. She further endorses weakness of the bilateral upper extremity with any use of her muscles. She states that the symptoms have been ongoing for the last 5 weeks and today she just could not take it anymore. Symptoms present at rest as well.  At bedside she denied headache, vision changes, nausea, vomiting, abdominal pain, chest pain, fever, chills, cough, dysuria, hematuria, diarrhea, constipation, SI and HI. She endorses generalized pain all over and weakness of her upper extremity.  She endorses compliance with her asthma medication. However she states that she used her inhalers and nebulizers appropriately and still remained short of breath prompting her to present to the emergency department.  Social history: She lives with her boyfriend. Denies tobacco use, alcohol, and recreational drug use.  Assessment & Plan:   Principal Problem:   Asthma exacerbation Active Problems:   Hypertension   Depression   Hyperlipidemia   Ulcerative colitis ([place])   Fatigue   Morbid obesity (HCC)   Muscle weakness   Grade I diastolic dysfunction   Acute hypoxic respiratory failure secondary to asthma exacerbation: Feels better than yesterday.  Now saturating about 94% on room air but on lung examination, she still has very diminished breath sounds with  scattered expiratory wheezes.  Still she does not feel back to baseline.  Continue current dose of Solu-Medrol and as needed albuterol and Pulmicort.  Generalized weakness: Admitting hospitalist as documented in H&P that patient has bilateral upper extremity weakness however I was able to acquire more history from the patient where she was very clear about her symptoms and stated that she has generalized weakness instead of any focal weakness in shoulders or lower extremities.  She was able to move all her extremities with equal strength.  I do not suspect dermatomyositis or polymyositis.  I will consult PT OT for that.  CKD stage IIIa: At baseline.  Monitor.  History of ulcerative colitis-status post sigmoid resection, does not appear to be in acute flare at this time -Follow-up with her GI outpatient  Anxiety/depression with likely components of fibromyalgia Continue bupropion, duloxetine, and tramadol as needed for pain  Hypertension-blood pressure very well controlled.  Continue home propranolol 10 mg twice daily, spironolactone 25 mg daily, torsemide 40 mg daily  Insulin-dependent diabetes mellitus-on home Victoza and insulin degludec 88 units daily.  Hemoglobin A1c 9.2.  She was started on Lantus 40 units twice daily with SSI.  She is significantly hyperglycemic.  She has received 40 units of Lantus this morning already.  We will give her 10 more units and switch to 50 units twice daily and continue SSI.  Obstructive sleep apnea-suspect obesity hypo ventilation syndrome -CPAP nightly ordered -Educated patient that she needs to be using her CPAP machine at least 6 hours continuously daily for effectiveness  DVT prophylaxis: Place TED hose Start: 01/08/20 1844   Code Status: Full Code  Family Communication:  None present at bedside.  Plan of care discussed with patient in length and he verbalized understanding and agreed with it.  Status is: Observation  The patient will require  care spanning > 2 midnights and should be moved to inpatient because: Inpatient level of care appropriate due to severity of illness  Dispo: The patient is from: Home              Anticipated d/c is to: Home              Anticipated d/c date is: 1 day              Patient currently is not medically stable to d/c.        Estimated body mass index is 43.58 kg/m as calculated from the following:   Height as of this encounter: 5' 5"  (1.651 m).   Weight as of this encounter: 118.8 kg.      Nutritional status:               Consultants:   None  Procedures:   None  Antimicrobials:  Anti-infectives (From admission, onward)   None         Subjective: Seen and examined.  Still in the ED.  Feels much better but still does not feel like back to baseline.  Some shortness of breath.  No other complaint.  Complains of generalized weakness but no any focal weakness.  Objective: Vitals:   01/09/20 0400 01/09/20 0600 01/09/20 0700 01/09/20 0955  BP: 120/65 118/67 111/72 (!) 121/59  Pulse: 85 78 78 78  Resp: (!) 25 20 (!) 22   Temp:      TempSrc:      SpO2: 92% 90% 93%   Weight:      Height:       No intake or output data in the 24 hours ending 01/09/20 1027 Filed Weights   01/08/20 1221  Weight: 118.8 kg    Examination:  General exam: Appears calm and comfortable, morbidly obese Respiratory system: Globally diminished breath sounds with scattered expiratory wheezes. Respiratory effort normal. Cardiovascular system: S1 & S2 heard, RRR. No JVD, murmurs, rubs, gallops or clicks. No pedal edema. Gastrointestinal system: Abdomen is nondistended, soft and nontender. No organomegaly or masses felt. Normal bowel sounds heard. Central nervous system: Alert and oriented. No focal neurological deficits. Extremities: Symmetric 5 x 5 power. Skin: No rashes, lesions or ulcers Psychiatry: Judgement and insight appear normal. Mood & affect appropriate.    Data  Reviewed: I have personally reviewed following labs and imaging studies  CBC: Recent Labs  Lab 01/08/20 1229 01/09/20 0530  WBC 11.7* 12.0*  HGB 14.5 14.3  HCT 44.7 43.9  MCV 91.2 89.4  PLT 307 160   Basic Metabolic Panel: Recent Labs  Lab 01/08/20 1229 01/09/20 0530  NA 135 131*  K 4.1 4.0  CL 88* 86*  CO2 32 29  GLUCOSE 328* 398*  BUN 22 32*  CREATININE 1.23* 1.45*  CALCIUM 10.0 9.9   GFR: Estimated Creatinine Clearance: 50.6 mL/min (A) (by C-G formula based on SCr of 1.45 mg/dL (H)). Liver Function Tests: No results for input(s): AST, ALT, ALKPHOS, BILITOT, PROT, ALBUMIN in the last 168 hours. No results for input(s): LIPASE, AMYLASE in the last 168 hours. No results for input(s): AMMONIA in the last 168 hours. Coagulation Profile: No results for input(s): INR, PROTIME in the last 168 hours. Cardiac Enzymes: Recent Labs  Lab 01/09/20 0530  CKTOTAL 67  BNP (last 3 results) Recent Labs    05/10/19 1644  PROBNP 118   HbA1C: Recent Labs    01/09/20 0530  HGBA1C 9.2*   CBG: Recent Labs  Lab 01/08/20 1700 01/08/20 2219 01/09/20 0841  GLUCAP 258* 437* 383*   Lipid Profile: No results for input(s): CHOL, HDL, LDLCALC, TRIG, CHOLHDL, LDLDIRECT in the last 72 hours. Thyroid Function Tests: No results for input(s): TSH, T4TOTAL, FREET4, T3FREE, THYROIDAB in the last 72 hours. Anemia Panel: No results for input(s): VITAMINB12, FOLATE, FERRITIN, TIBC, IRON, RETICCTPCT in the last 72 hours. Sepsis Labs: No results for input(s): PROCALCITON, LATICACIDVEN in the last 168 hours.  Recent Results (from the past 240 hour(s))  Respiratory Panel by RT PCR (Flu A&B, Covid) - Nasopharyngeal Swab     Status: None   Collection Time: 01/08/20  1:37 PM   Specimen: Nasopharyngeal Swab  Result Value Ref Range Status   SARS Coronavirus 2 by RT PCR NEGATIVE NEGATIVE Final    Comment: (NOTE) SARS-CoV-2 target nucleic acids are NOT DETECTED.  The SARS-CoV-2 RNA is  generally detectable in upper respiratoy specimens during the acute phase of infection. The lowest concentration of SARS-CoV-2 viral copies this assay can detect is 131 copies/mL. A negative result does not preclude SARS-Cov-2 infection and should not be used as the sole basis for treatment or other patient management decisions. A negative result may occur with  improper specimen collection/handling, submission of specimen other than nasopharyngeal swab, presence of viral mutation(s) within the areas targeted by this assay, and inadequate number of viral copies (<131 copies/mL). A negative result must be combined with clinical observations, patient history, and epidemiological information. The expected result is Negative.  Fact Sheet for Patients:  PinkCheek.be  Fact Sheet for Healthcare Providers:  GravelBags.it  This test is no t yet approved or cleared by the [place] FDA and  has been authorized for detection and/or diagnosis of SARS-CoV-2 by FDA under an Emergency Use Authorization (EUA). This EUA will remain  in effect (meaning this test can be used) for the duration of the COVID-19 declaration under Section 564(b)(1) of the Act, 21 U.S.C. section 360bbb-3(b)(1), unless the authorization is terminated or revoked sooner.     Influenza A by PCR NEGATIVE NEGATIVE Final   Influenza B by PCR NEGATIVE NEGATIVE Final    Comment: (NOTE) The Xpert Xpress SARS-CoV-2/FLU/RSV assay is intended as an aid in  the diagnosis of influenza from Nasopharyngeal swab specimens and  should not be used as a sole basis for treatment. Nasal washings and  aspirates are unacceptable for Xpert Xpress SARS-CoV-2/FLU/RSV  testing.  Fact Sheet for Patients: PinkCheek.be  Fact Sheet for Healthcare Providers: GravelBags.it  This test is not yet approved or cleared by the [place] FDA and  has been authorized for detection and/or diagnosis of SARS-CoV-2 by  FDA under an Emergency Use Authorization (EUA). This EUA will remain  in effect (meaning this test can be used) for the duration of the  Covid-19 declaration under Section 564(b)(1) of the Act, 21  U.S.C. section 360bbb-3(b)(1), unless the authorization is  terminated or revoked. Performed at [facility], [place] [address], [place], [place] [address]       Radiology Studies: DG Chest 2 View  Result Date: 01/08/2020 CLINICAL DATA:  Shortness of breath. EXAM: CHEST - 2 VIEW COMPARISON:  Prior chest radiographs 02/01/2019. FINDINGS: Heart size within normal limits. There is no appreciable airspace consolidation. No evidence of pleural effusion or pneumothorax.  No acute bony abnormality identified. IMPRESSION: No evidence of active cardiopulmonary disease. Electronically Signed   By: Kellie Simmering DO   On: 01/08/2020 13:12    Scheduled Meds: . budesonide  0.5 mg Nebulization Daily  . buPROPion  300 mg Oral Daily  . cholecalciferol  5,000 Units Oral QODAY  . dicyclomine  20 mg Oral TID AC  . DULoxetine  60 mg Oral Daily  . enoxaparin (LOVENOX) injection  55 mg Subcutaneous Q24H  . gabapentin  300 mg Oral QHS  . insulin aspart  0-20 Units Subcutaneous TID WC  . insulin glargine  40 Units Subcutaneous BID  . methylPREDNISolone (SOLU-MEDROL) injection  60 mg Intravenous Q12H  . pantoprazole  40 mg Oral Daily  . propranolol  10 mg Oral BID  . rOPINIRole  4 mg Oral BID  . spironolactone  25 mg Oral Daily  . torsemide  40 mg Oral Daily   Continuous Infusions:   LOS: 0 days   Time spent: 37 minutes   Darliss Cheney, MD [facility]  01/09/2020, 10:27 AM   To contact the attending provider between 7A-7P or the covering provider during after hours 7P-7A, please log into the web site [email or web address].

## 2020-01-09 NOTE — Progress Notes (Signed)
Inpatient Diabetes Program Recommendations  AACE/ADA: New Consensus Statement on Inpatient Glycemic Control (2015)  Target Ranges:  Prepandial:   less than 140 mg/dL      Peak postprandial:   less than 180 mg/dL (1-2 hours)      Critically ill patients:  140 - 180 mg/dL   Lab Results  Component Value Date   GLUCAP 383 (H) 01/09/2020   HGBA1C 9.2 (H) 01/09/2020    Review of Glycemic Control Results for Kathleen Hatfield, Kathleen Hatfield "[name]" (MRN [id number]) as of 01/09/2020 12:05  Ref. Range 01/08/2020 17:00 01/08/2020 22:19 01/09/2020 08:41  Glucose-Capillary Latest Ref Range: 70 - 99 mg/dL 258 (H) 437 (H) 383 (H)   Diabetes history: DM2 Outpatient Diabetes medications: Tresiba 88 units qd + Victoza 1.8 mg qd Current orders for Inpatient glycemic control: Lantus 50 units bid + Novolog 0-20 tid + Solu Medrol 60 mg bid  Inpatient Diabetes Program Recommendations:    Patient currently in ED. -Add Novolog 5 units tid meal coverage if eats 50% -Increase Novolog correction to qid  Thank you, Nani Gasser. Renai Lopata, RN, MSN, CDE  Diabetes Coordinator Inpatient Glycemic Control Team Team Pager [phone] (8am-5pm) 01/09/2020 12:11 PM

## 2020-01-09 NOTE — ED Notes (Signed)
Lunch Tray Ordered @ 1009.

## 2020-01-09 NOTE — ED Notes (Signed)
Attempted report to floor, call back number left

## 2020-01-10 ENCOUNTER — Telehealth: Payer: Self-pay

## 2020-01-10 ENCOUNTER — Other Ambulatory Visit: Payer: Medicare HMO

## 2020-01-10 LAB — BASIC METABOLIC PANEL
Anion gap: 16 — ABNORMAL HIGH (ref 5–15)
BUN: 51 mg/dL — ABNORMAL HIGH (ref 8–23)
CO2: 31 mmol/L (ref 22–32)
Calcium: 9.6 mg/dL (ref 8.9–10.3)
Chloride: 84 mmol/L — ABNORMAL LOW (ref 98–111)
Creatinine, Ser: 1.52 mg/dL — ABNORMAL HIGH (ref 0.44–1.00)
GFR, Estimated: 38 mL/min — ABNORMAL LOW (ref >60)
Glucose, Bld: 374 mg/dL — ABNORMAL HIGH (ref 70–99)
Potassium: 3.3 mmol/L — ABNORMAL LOW (ref 3.5–5.1)
Sodium: 131 mmol/L — ABNORMAL LOW (ref 135–145)

## 2020-01-10 LAB — CBC WITH DIFFERENTIAL/PLATELET
Abs Immature Granulocytes: 0.12 10*3/uL — ABNORMAL HIGH (ref 0.00–0.07)
Basophils Absolute: 0 10*3/uL (ref 0.0–0.1)
Basophils Relative: 0 %
Eosinophils Absolute: 0 10*3/uL (ref 0.0–0.5)
Eosinophils Relative: 0 %
HCT: 41.6 % (ref 36.0–46.0)
Hemoglobin: 14 g/dL (ref 12.0–15.0)
Immature Granulocytes: 1 %
Lymphocytes Relative: 17 %
Lymphs Abs: 2.9 10*3/uL (ref 0.7–4.0)
MCH: 29.3 pg (ref 26.0–34.0)
MCHC: 33.7 g/dL (ref 30.0–36.0)
MCV: 87 fL (ref 80.0–100.0)
Monocytes Absolute: 0.7 10*3/uL (ref 0.1–1.0)
Monocytes Relative: 4 %
Neutro Abs: 13.4 10*3/uL — ABNORMAL HIGH (ref 1.7–7.7)
Neutrophils Relative %: 78 %
Platelets: 306 10*3/uL (ref 150–400)
RBC: 4.78 MIL/uL (ref 3.87–5.11)
RDW: 15.4 % (ref 11.5–15.5)
WBC: 17.2 10*3/uL — ABNORMAL HIGH (ref 4.0–10.5)
nRBC: 0 % (ref 0.0–0.2)

## 2020-01-10 LAB — ALDOLASE: Aldolase: 12.2 U/L — ABNORMAL HIGH (ref 3.3–10.3)

## 2020-01-10 LAB — MAGNESIUM: Magnesium: 1.8 mg/dL (ref 1.7–2.4)

## 2020-01-10 LAB — ANA: Anti Nuclear Antibody (ANA): NEGATIVE

## 2020-01-10 LAB — GLUCOSE, CAPILLARY: Glucose-Capillary: 302 mg/dL — ABNORMAL HIGH (ref 70–99)

## 2020-01-10 MED ORDER — POTASSIUM CHLORIDE CRYS ER 20 MEQ PO TBCR
40.0000 meq | EXTENDED_RELEASE_TABLET | Freq: Once | ORAL | Status: AC
  Administered 2020-01-10: 40 meq via ORAL
  Filled 2020-01-10: qty 2

## 2020-01-10 MED ORDER — PREDNISONE 50 MG PO TABS: ORAL_TABLET | ORAL | 0 refills | Status: DC

## 2020-01-10 MED ORDER — INSULIN GLARGINE 100 UNIT/ML ~~LOC~~ SOLN
60.0000 [IU] | Freq: Two times a day (BID) | SUBCUTANEOUS | Status: DC
  Administered 2020-01-10: 60 [IU] via SUBCUTANEOUS
  Filled 2020-01-10 (×2): qty 0.6

## 2020-01-10 NOTE — Progress Notes (Signed)
Discharge instructions, RX's and follow up appts explained and provided to patient verbalized understanding.patient left floor via wheelchair accompanied by staff.  Shawn Dannenberg, Tivis Ringer, RN

## 2020-01-10 NOTE — Discharge Summary (Signed)
Physician Discharge Summary  Kathleen Hatfield:[id number] DOB: [birth year] DOA: 01/08/2020  PCP: [name], MD  Admit date: 01/08/2020 Discharge date: 01/10/2020  Admitted From: Home Disposition: Home  Recommendations for Outpatient Follow-up:  1. Follow up with PCP in 1-2 weeks 2. Please obtain BMP/CBC in one week 3. Please follow up with your PCP on the following pending results: Unresulted Labs (From admission, onward)          Start     Ordered   01/09/20 0500  Aldolase  Tomorrow morning,   R        01/08/20 1850   01/09/20 0500  ANA  Tomorrow morning,   R        01/08/20 1850           Home Health: None Equipment/Devices: None  Discharge Condition: Stable CODE STATUS: Full code Diet recommendation: Cardiac  Subjective: Seen and examined.  Sitting at the edge of the bed eating breakfast.  Looks brighter.  Denied any shortness of breath.  Feels much better.  Brief/Interim Summary: Kathleen Hatfield a 64 y.o.femalewith medical history significant forchronic diastolic heart failure, asthma, obstructive sleep apnea on CPAP, morbid obesity, hypertension, diabetes mellitus on insulin, hyperlipidemia.  Presented to the emergency department for chief concern of shortness of breath ongoing for the last 5 to 6 weeks which was progressively worsened.  He also complained of generalized weakness.  She was admitted under hospital service with acute hypoxic respiratory failure secondary to acute asthma exacerbation.  Started on Solu-Medrol as well as albuterol.  Subsequently weaned down to room air.  Albuterol was switched to scheduled per patient's request.  She has improved significantly today.  She is off of oxygen.  Lungs are clear to auscultation with no wheezes.  She is ready to go home and is being discharged home in stable condition with 3 more days of oral steroids.  PT OT was consulted however she has not been seen by them yet but patient is walking around in her room without  requiring any help.  Of note, admitting hospitalist had suspected dermatomyositis or polymyositis for presumed focal weakness but patient denied having any focal weakness.  She was very clear in telling me that she has generalized weakness which is improved with improvement of her asthma exacerbation.  Discharge Diagnoses:  Principal Problem:   Asthma exacerbation Active Problems:   Hypertension   Depression   Hyperlipidemia   Ulcerative colitis ([place])   Fatigue   Morbid obesity (HCC)   Muscle weakness   Grade I diastolic dysfunction   Acute asthma exacerbation    Discharge Instructions   Allergies as of 01/10/2020      Reactions   Almond Oil Anaphylaxis, Shortness Of Breath, Swelling   Morphine And Related Shortness Of Breath, Other (See Comments)   Pt. States while in the hospital it affected her breathing, O2 dropped to the 70's   Atorvastatin Other (See Comments)   Leg weakness   Ceclor [cefaclor] Nausea And Vomiting   Ciprofloxacin Other (See Comments)   Makes joints and muscles ache   Levaquin [levofloxacin] Other (See Comments)   Body aches   Losartan Other (See Comments)   Weakness   Statins Other (See Comments)   Leg and body weakness   Sulfa Antibiotics Nausea And Vomiting      Medication List    TAKE these medications   acetaminophen 500 MG tablet Commonly known as: TYLENOL Take 1,000 mg by mouth every 6 (six) hours  as needed for pain.   albuterol 108 (90 Base) MCG/ACT inhaler Commonly known as: VENTOLIN HFA Inhale 2 puffs into the lungs every 3 (three) hours as needed for wheezing or shortness of breath.   Bayer Contour Next Test test strip Generic drug: glucose blood 1 each by Other route daily. As directed   budesonide 0.5 MG/2ML nebulizer solution Commonly known as: PULMICORT INHALE CONTENTS OF ONE NEBULE VIA NEBULIZER ONCE DAILY What changed: See the new instructions.   buPROPion 300 MG 24 hr tablet Commonly known as: WELLBUTRIN XL TAKE 1  TABLET BY MOUTH DAILY What changed:   how much to take  when to take this   dicyclomine 20 MG tablet Commonly known as: BENTYL TAKE 1 TABLET UP TO FOUR TIMES A DAY FOR GI CRAMPING, PAIN, AND NAUSEA/VOMITING AS NEEDED What changed: See the new instructions.   DULoxetine 60 MG capsule Commonly known as: CYMBALTA TAKE 1 CAPSULE BY MOUTH ONCE DAILY.   gabapentin 100 MG capsule Commonly known as: NEURONTIN TAKE 3 CAPSULES BY MOUTH AT BEDTIME.   Global Ease Inject Pen Needles 32G X 4 MM Misc Generic drug: Insulin Pen Needle 1 each by Other route daily.   ipratropium-albuterol 0.5-2.5 (3) MG/3ML Soln Commonly known as: DUONEB Take 3 mLs by nebulization See admin instructions. Nebulize 3 ml's and inhale into the lungs two to three times a day   mesalamine 1.2 g EC tablet Commonly known as: LIALDA Take 1.2 g by mouth daily with breakfast. Take 1.2 grams by mouth once a day with breakfast- increase as directed   Microlet Lancets Misc 1 each by Other route. As directed   ondansetron 4 MG disintegrating tablet Commonly known as: Zofran ODT Take 1 tablet (4 mg total) by mouth every 8 (eight) hours as needed for nausea or vomiting. What changed: reasons to take this   pantoprazole 40 MG tablet Commonly known as: PROTONIX Take 40 mg by mouth daily before breakfast.   polyethylene glycol 17 g packet Commonly known as: MIRALAX / GLYCOLAX Take 17 g by mouth daily as needed for mild constipation (MIX AND DRINK AS DIRECTED).   potassium chloride SA 20 MEQ tablet Commonly known as: Klor-Con M20 Take 3 tablets (60 mEq total) by mouth 2 (two) times daily.   predniSONE 50 MG tablet Commonly known as: DELTASONE . Start taking on: January 11, 2020   propranolol 10 MG tablet Commonly known as: INDERAL Take 1 tablet (10 mg total) by mouth 2 (two) times daily.   rOPINIRole 4 MG tablet Commonly known as: REQUIP TAKE 1 TABLET BY MOUTH TWICE DAILY What changed:   how much to  take  how to take this  when to take this  additional instructions   spironolactone 25 MG tablet Commonly known as: ALDACTONE TAKE 1 TABLET BY MOUTH DAILY   torsemide 20 MG tablet Commonly known as: DEMADEX Take 2 tablets (40 mg total) by mouth daily.   traMADol 50 MG tablet Commonly known as: ULTRAM Take 50 mg by mouth daily as needed for pain.   Tyler Aas FlexTouch 200 UNIT/ML FlexTouch Pen Generic drug: insulin degludec Inject 88 Units into the skin daily.   UNABLE TO FIND CPAP- At bedtime   Victoza 18 MG/3ML Sopn Generic drug: liraglutide INJECT 1.8MG UNDER THE SKIN DAILY What changed: See the new instructions.   Vitamin D-3 125 MCG (5000 UT) Tabs Take 5,000 Units by mouth every other day.       Follow-up Information    [name]  S, MD Follow up in 1 week(s).   Specialty: Family Medicine Contact information: [place] [place] [address] [phone]        [name], MD .   Specialty: Cardiology Contact information: [phone] [place] [address] [phone]              Allergies  Allergen Reactions  . Almond Oil Anaphylaxis, Shortness Of Breath and Swelling  . Morphine And Related Shortness Of Breath and Other (See Comments)    Pt. States while in the hospital it affected her breathing, O2 dropped to the 70's  . Atorvastatin Other (See Comments)    Leg weakness  . Ceclor [Cefaclor] Nausea And Vomiting  . Ciprofloxacin Other (See Comments)    Makes joints and muscles ache  . Levaquin [Levofloxacin] Other (See Comments)    Body aches  . Losartan Other (See Comments)    Weakness   . Statins Other (See Comments)    Leg and body weakness  . Sulfa Antibiotics Nausea And Vomiting    Consultations: None   Procedures/Studies: DG Chest 2 View  Result Date: 01/08/2020 CLINICAL DATA:  Shortness of breath. EXAM: CHEST - 2 VIEW COMPARISON:  Prior chest radiographs  02/01/2019. FINDINGS: Heart size within normal limits. There is no appreciable airspace consolidation. No evidence of pleural effusion or pneumothorax. No acute bony abnormality identified. IMPRESSION: No evidence of active cardiopulmonary disease. Electronically Signed   By: Kellie Simmering DO   On: 01/08/2020 13:12   MR CARDIAC MORPHOLOGY W WO CONTRAST  Result Date: 01/01/2020 CLINICAL DATA:  Assess RV hypertrophy. EXAM: CARDIAC MRI TECHNIQUE: The patient was scanned on a 1.5 Tesla GE magnet. A dedicated cardiac coil was used. Functional imaging was done using Fiesta sequences. 2,3, and 4 chamber views were done to assess for RWMA's. Modified Simpson's rule using a short axis stack was used to calculate an ejection fraction on a dedicated work Conservation officer, nature. The patient received 10 cc of Gadavist. After 10 minutes inversion recovery sequences were used to assess for infiltration and scar tissue. CONTRAST:  10 cc Gadavist FINDINGS: Limited images of the lung fields showed no gross abnormalities. There is very extensive epicardial adipose tissue. Lipomatous atrial septal hypertrophy. Normal left ventricular size with mild concentric LV hypertrophy. Hyperdynamic systolic function, EF 80%, normal wall motion. There is evidence of turbulence across the LV outflow tract. Normal right ventricular size and systolic function, EF 99%. No evidence for RV hypertrophy. Normal right and left atrial sizes. Trileaflet aortic valve with no significant stenosis or regurgitation. Trivial mitral regurgitation. There is mid-wall late gadolinium enhancement in the mid inferoseptal wall at the RV insertion site. Measurements: LVEDV 77 mL LVSV 60 mL LVEF 78% RVEDV 63 mL RVSV 40 mL RVEF 64% IMPRESSION: 1. Normal LV size with mild LV hypertrophy, hyperdynamic systolic function with EF 78%. 2. Normal RV size and systolic function, EF 83%. There does not appear to be true RV hypertrophy present. There is very thick  epicardial adipose tissue that may have been mistaken as RVH on the echo. 3. There was a small area of mid-wall LGE at the RV insertion site. This is nonspecific and may be suggestive of elevated filling pressures. Dalton Mclean Electronically Signed   By: Loralie Champagne M.D.   On: 01/01/2020 17:46     Discharge Exam: Vitals:   01/10/20 0823 01/10/20 0825  BP:    Pulse: 71  Resp: 14   Temp:    SpO2: 93% 93%   Vitals:   01/10/20 0135 01/10/20 0605 01/10/20 0823 01/10/20 0825  BP:  112/61    Pulse: 80 70 71   Resp: 20 17 14    Temp:  97.8 F (36.6 C)    TempSrc:  Oral    SpO2: 96% 91% 93% 93%  Weight:      Height:        General: Pt is alert, awake, not in acute distress Cardiovascular: RRR, S1/S2 +, no rubs, no gallops Respiratory: CTA bilaterally, no wheezing, no rhonchi Abdominal: Soft, NT, ND, bowel sounds + Extremities: no edema, no cyanosis    The results of significant diagnostics from this hospitalization (including imaging, microbiology, ancillary and laboratory) are listed below for reference.     Microbiology: Recent Results (from the past 240 hour(s))  Respiratory Panel by RT PCR (Flu A&B, Covid) - Nasopharyngeal Swab     Status: None   Collection Time: 01/08/20  1:37 PM   Specimen: Nasopharyngeal Swab  Result Value Ref Range Status   SARS Coronavirus 2 by RT PCR NEGATIVE NEGATIVE Final    Comment: (NOTE) SARS-CoV-2 target nucleic acids are NOT DETECTED.  The SARS-CoV-2 RNA is generally detectable in upper respiratoy specimens during the acute phase of infection. The lowest concentration of SARS-CoV-2 viral copies this assay can detect is 131 copies/mL. A negative result does not preclude SARS-Cov-2 infection and should not be used as the sole basis for treatment or other patient management decisions. A negative result may occur with  improper specimen collection/handling, submission of specimen other than nasopharyngeal swab, presence of viral  mutation(s) within the areas targeted by this assay, and inadequate number of viral copies (<131 copies/mL). A negative result must be combined with clinical observations, patient history, and epidemiological information. The expected result is Negative.  Fact Sheet for Patients:  PinkCheek.be  Fact Sheet for Healthcare Providers:  GravelBags.it  This test is no t yet approved or cleared by the [place] FDA and  has been authorized for detection and/or diagnosis of SARS-CoV-2 by FDA under an Emergency Use Authorization (EUA). This EUA will remain  in effect (meaning this test can be used) for the duration of the COVID-19 declaration under Section 564(b)(1) of the Act, 21 U.S.C. section 360bbb-3(b)(1), unless the authorization is terminated or revoked sooner.     Influenza A by PCR NEGATIVE NEGATIVE Final   Influenza B by PCR NEGATIVE NEGATIVE Final    Comment: (NOTE) The Xpert Xpress SARS-CoV-2/FLU/RSV assay is intended as an aid in  the diagnosis of influenza from Nasopharyngeal swab specimens and  should not be used as a sole basis for treatment. Nasal washings and  aspirates are unacceptable for Xpert Xpress SARS-CoV-2/FLU/RSV  testing.  Fact Sheet for Patients: PinkCheek.be  Fact Sheet for Healthcare Providers: GravelBags.it  This test is not yet approved or cleared by the [place] FDA and  has been authorized for detection and/or diagnosis of SARS-CoV-2 by  FDA under an Emergency Use Authorization (EUA). This EUA will remain  in effect (meaning this test can be used) for the duration of the  Covid-19 declaration under Section 564(b)(1) of the Act, 21  U.S.C. section 360bbb-3(b)(1), unless the authorization is  terminated or revoked. Performed at [facility], [place] [address], [place], [place] [address]      Labs: BNP (last 3  results) Recent Labs    02/01/19 1822 01/08/20 1229  BNP 37.4 58.9  Basic Metabolic Panel: Recent Labs  Lab 01/08/20 1229 01/09/20 0530 01/09/20 1233 01/10/20 0154  NA 135 131* 131* 131*  K 4.1 4.0 3.9 3.3*  CL 88* 86* 86* 84*  CO2 32 29 31 31   GLUCOSE 328* 398* 432* 374*  BUN 22 32* 36* 51*  CREATININE 1.23* 1.45* 1.46* 1.52*  CALCIUM 10.0 9.9 9.9 9.6  MG  --   --   --  1.8   Liver Function Tests: No results for input(s): AST, ALT, ALKPHOS, BILITOT, PROT, ALBUMIN in the last 168 hours. No results for input(s): LIPASE, AMYLASE in the last 168 hours. No results for input(s): AMMONIA in the last 168 hours. CBC: Recent Labs  Lab 01/08/20 1229 01/09/20 0530 01/10/20 0154  WBC 11.7* 12.0* 17.2*  NEUTROABS  --   --  13.4*  HGB 14.5 14.3 14.0  HCT 44.7 43.9 41.6  MCV 91.2 89.4 87.0  PLT 307 322 306   Cardiac Enzymes: Recent Labs  Lab 01/09/20 0530  CKTOTAL 67   BNP: Invalid input(s): POCBNP CBG: Recent Labs  Lab 01/09/20 1212 01/09/20 1424 01/09/20 1634 01/09/20 2130 01/10/20 0816  GLUCAP 426* 423* 385* 408* 302*   D-Dimer No results for input(s): DDIMER in the last 72 hours. Hgb A1c Recent Labs    01/09/20 0530  HGBA1C 9.2*   Lipid Profile No results for input(s): CHOL, HDL, LDLCALC, TRIG, CHOLHDL, LDLDIRECT in the last 72 hours. Thyroid function studies No results for input(s): TSH, T4TOTAL, T3FREE, THYROIDAB in the last 72 hours.  Invalid input(s): FREET3 Anemia work up No results for input(s): VITAMINB12, FOLATE, FERRITIN, TIBC, IRON, RETICCTPCT in the last 72 hours. Urinalysis    Component Value Date/Time   COLORURINE STRAW (A) 03/12/2019 1906   APPEARANCEUR CLEAR 03/12/2019 1906   LABSPEC 1.009 03/12/2019 1906   PHURINE 6.0 03/12/2019 1906   GLUCOSEU 150 (A) 03/12/2019 1906   HGBUR NEGATIVE 03/12/2019 1906   BILIRUBINUR negative 12/25/2019 1515   KETONESUR NEGATIVE 03/12/2019 1906   PROTEINUR Positive (A) 12/25/2019 1515    PROTEINUR NEGATIVE 03/12/2019 1906   UROBILINOGEN negative (A) 12/25/2019 1515   UROBILINOGEN 0.2 09/19/2008 1400   NITRITE negative 12/25/2019 1515   NITRITE NEGATIVE 03/12/2019 1906   LEUKOCYTESUR Large (3+) (A) 12/25/2019 1515   LEUKOCYTESUR NEGATIVE 03/12/2019 1906   Sepsis Labs Invalid input(s): PROCALCITONIN,  WBC,  LACTICIDVEN Microbiology Recent Results (from the past 240 hour(s))  Respiratory Panel by RT PCR (Flu A&B, Covid) - Nasopharyngeal Swab     Status: None   Collection Time: 01/08/20  1:37 PM   Specimen: Nasopharyngeal Swab  Result Value Ref Range Status   SARS Coronavirus 2 by RT PCR NEGATIVE NEGATIVE Final    Comment: (NOTE) SARS-CoV-2 target nucleic acids are NOT DETECTED.  The SARS-CoV-2 RNA is generally detectable in upper respiratoy specimens during the acute phase of infection. The lowest concentration of SARS-CoV-2 viral copies this assay can detect is 131 copies/mL. A negative result does not preclude SARS-Cov-2 infection and should not be used as the sole basis for treatment or other patient management decisions. A negative result may occur with  improper specimen collection/handling, submission of specimen other than nasopharyngeal swab, presence of viral mutation(s) within the areas targeted by this assay, and inadequate number of viral copies (<131 copies/mL). A negative result must be combined with clinical observations, patient history, and epidemiological information. The expected result is Negative.  Fact Sheet for Patients:  PinkCheek.be  Fact Sheet for Healthcare Providers:  GravelBags.it  This  test is no t yet approved or cleared by the [place] and  has been authorized for detection and/or diagnosis of SARS-CoV-2 by FDA under an Emergency Use Authorization (EUA). This EUA will remain  in effect (meaning this test can be used) for the duration of the COVID-19 declaration  under Section 564(b)(1) of the Act, 21 U.S.C. section 360bbb-3(b)(1), unless the authorization is terminated or revoked sooner.     Influenza A by PCR NEGATIVE NEGATIVE Final   Influenza B by PCR NEGATIVE NEGATIVE Final    Comment: (NOTE) The Xpert Xpress SARS-CoV-2/FLU/RSV assay is intended as an aid in  the diagnosis of influenza from Nasopharyngeal swab specimens and  should not be used as a sole basis for treatment. Nasal washings and  aspirates are unacceptable for Xpert Xpress SARS-CoV-2/FLU/RSV  testing.  Fact Sheet for Patients: PinkCheek.be  Fact Sheet for Healthcare Providers: GravelBags.it  This test is not yet approved or cleared by the [place] FDA and  has been authorized for detection and/or diagnosis of SARS-CoV-2 by  FDA under an Emergency Use Authorization (EUA). This EUA will remain  in effect (meaning this test can be used) for the duration of the  Covid-19 declaration under Section 564(b)(1) of the Act, 21  U.S.C. section 360bbb-3(b)(1), unless the authorization is  terminated or revoked. Performed at [facility], [place] [address] [place], [place] [address]      Time coordinating discharge: Over 30 minutes  SIGNED:   Darliss Cheney, MD  [facility] 01/10/2020, 10:07 AM  If 7PM-7AM, please contact night-coverage [email or web address]

## 2020-01-10 NOTE — Plan of Care (Signed)

## 2020-01-10 NOTE — Telephone Encounter (Signed)
Transition Care Management Follow-up Telephone Call  Date of discharge and from where: [facility]/10/2020[name]  How have you been since you were released from the hospital? Patient is doing better just still fatigued.   Any questions or concerns? No  Items Reviewed:  Did the pt receive and understand the discharge instructions provided? Yes   Medications obtained and verified? Yes   Other? No   Any new allergies since your discharge? No   Dietary orders reviewed? Yes  Do you have support at home? Yes   Home Care and Equipment/Supplies: Were home health services ordered? no If so, what is the name of the agency? N/A  Has the agency set up a time to come to the patient's home? not applicable Were any new equipment or medical supplies ordered?  No What is the name of the medical supply agency? N/A Were you able to get the supplies/equipment? not applicable Do you have any questions related to the use of the equipment or supplies? No  Functional Questionnaire: (I = Independent and D = Dependent) ADLs: I  Bathing/Dressing- I  Meal Prep- I  Eating- I  Maintaining continence- I  Transferring/Ambulation- I  Managing Meds- I  Follow up appointments reviewed:   PCP Hospital f/u appt confirmed? Yes  Scheduled to see Dr. [name] on 01/15/2020 @ 2 pm.  [facility] f/u appt confirmed? following up with cardiology   Are transportation arrangements needed? No   If their condition worsens, is the pt aware to call PCP or go to the Emergency Dept.? Yes  Was the patient provided with contact information for the PCP's office or ED? Yes  Was to pt encouraged to call back with questions or concerns? Yes

## 2020-01-10 NOTE — Progress Notes (Signed)
Patient ready to discharge home. No needs identified. Patient has a home CPAP machine.  Patria Warzecha LCSW

## 2020-01-10 NOTE — Discharge Instructions (Signed)
Asthma Action Plan, Adult An asthma action plan helps you understand how to manage your asthma and what to do when you have an asthma attack. The action plan is a color-coded plan that lists the symptoms that indicate whether or not your condition is under control and what actions to take.  If you have symptoms in the green zone, it means you are doing well.  If you have symptoms in the yellow zone, it means you are having problems.  If you have symptoms in the red zone, you need medical care right away. Follow the plan that you and your health care provider develop. Review your plan with your health care provider at each visit. What triggers your asthma? Knowing the things that can trigger an asthma attack or make your asthma symptoms worse is very important. Talk to your health care provider about your asthma triggers and how to avoid them. Record your known asthma triggers here: _______________ What is your personal best peak flow reading? If you use a peak flow meter, determine your personal best reading. Record it here: _______________ Red zone Symptoms in this zone mean that you should get medical help right away. You will likely feel distressed and have symptoms at rest that restrict your activity. You are in the red zone if:  You are breathing hard and quickly.  Your nose opens wide, your ribs show, and your neck muscles become visible when you breathe in.  Your lips, fingers, or toes are a bluish color.  You have trouble speaking in full sentences.  Your peak flow reading is less than __________ (less than 50% of your personal best).  Your symptoms do not improve within 15-20 minutes after you use your reliever or rescue medicine (bronchodilator). If you have any of these symptoms:  Call your local emergency services (911 in the [place] or go to the nearest emergency room.  Use your reliever or rescue medicine. ? Start a nebulizer treatment or take 2-4 puffs from a metered-dose  inhaler with a spacer. ? Repeat this action every 15-20 minutes until help arrives. Yellow zone Symptoms in this zone mean that your condition may be getting worse. You may have symptoms that interfere with exercise, are noticeably worse after exposure to triggers, or are worse at the first sign of a cold (upper respiratory infection). These may include:  Waking from sleep.  Coughing, especially at night or first thing in the morning.  Mild wheezing.  Chest tightness.  A peak flow reading that is __________ to __________ (50-79% of your personal best). If you have any of these symptoms:  Add the following medicine to the ones that you use daily: ? Reliever or rescue medicine and dosage: _______________ ? Additional medicine and dosage: _______________ Call your health care provider if:  You remain in the yellow zone for __________ hours.  You are using a reliever or rescue medicine more than 2-3 times a week. Green zone This zone means that your asthma is under control. You may not have any symptoms while you are in the green zone. This means that you:  Have no coughing or wheezing, even while you are working or playing.  Sleep through the night.  Are breathing well.  Have a peak flow reading that is above __________ (80% of your personal best or greater). If you are in the green zone, continue to manage your asthma as directed:  Take these medicines every day: ? Controller medicine and dosage: _______________ ? Controller medicine and  dosage: _______________ ? Controller medicine and dosage: _______________ ? Controller medicine and dosage: _______________  Before exercise, use this reliever or rescue medicine: _______________ Call your health care provider if you are using a reliever or rescue medicine more than 2-3 times a week. Where to find more information You can find more information about asthma from:  Centers for Disease Control and Prevention:  SamedayLab.co.za  American Lung Association: www.lung.org This information is not intended to replace advice given to you by your health care provider. Make sure you discuss any questions you have with your health care provider. Document Revised: 12/07/2017 Document Reviewed: 11/04/2016 Elsevier Patient Education  [place].

## 2020-01-15 ENCOUNTER — Other Ambulatory Visit: Payer: Self-pay

## 2020-01-15 ENCOUNTER — Ambulatory Visit (INDEPENDENT_AMBULATORY_CARE_PROVIDER_SITE_OTHER): Payer: Medicare HMO | Admitting: Family Medicine

## 2020-01-15 VITALS — BP 118/76 | HR 106 | Temp 96.7°F | Wt 260.0 lb

## 2020-01-15 DIAGNOSIS — R0602 Shortness of breath: Secondary | ICD-10-CM

## 2020-01-15 DIAGNOSIS — J45901 Unspecified asthma with (acute) exacerbation: Secondary | ICD-10-CM

## 2020-01-15 MED ORDER — POTASSIUM CHLORIDE ER 10 MEQ PO TBCR: 60.0000 meq | EXTENDED_RELEASE_TABLET | Freq: Two times a day (BID) | ORAL | 2 refills | Status: DC

## 2020-01-15 MED ORDER — BUDESONIDE 0.5 MG/2ML IN SUSP
1.0000 mg | Freq: Two times a day (BID) | RESPIRATORY_TRACT | Status: DC
Start: 2020-01-15 — End: 2020-02-06

## 2020-01-15 MED ORDER — BUDESONIDE-FORMOTEROL FUMARATE 160-4.5 MCG/ACT IN AERO: 2.0000 | INHALATION_SPRAY | Freq: Two times a day (BID) | RESPIRATORY_TRACT | 5 refills | Status: DC

## 2020-01-15 NOTE — Progress Notes (Signed)
This visit occurred during the SARS-CoV-2 public health emergency.  Safety protocols were in place, including screening questions prior to the visit, additional usage of staff PPE, and extensive cleaning of exam room while observing appropriate contact time as indicated for disinfecting solutions.  ========================== Admit date: 01/08/2020 Discharge date: 01/10/2020  Admitted From: Home Disposition: Home  Recommendations for Outpatient Follow-up:  1. Follow up with PCP in 1-2 weeks 2. Please obtain BMP/CBC in one week 3. Please follow up with your PCP on the following pending results:    Unresulted Labs (From admission, onward)                 Start     Ordered    01/09/20 0500  Aldolase  Tomorrow morning,   R        01/08/20 1850    01/09/20 0500  ANA  Tomorrow morning,   R        01/08/20 1850            Home Health: None Equipment/Devices: None  Discharge Condition: Stable CODE STATUS: Full code Diet recommendation: Cardiac  Subjective: Seen and examined.  Sitting at the edge of the bed eating breakfast.  Looks brighter.  Denied any shortness of breath.  Feels much better.  Brief/Interim Summary: Kathleen Hatfield a 64 y.o.femalewith medical history significant forchronic diastolic heart failure, asthma, obstructive sleep apnea on CPAP, morbid obesity, hypertension, diabetes mellitus on insulin, hyperlipidemia.  Presented to the emergency department for chief concern of shortness of breath ongoing for the last 5 to 6 weeks which was progressively worsened.  He also complained of generalized weakness.  She was admitted under hospital service with acute hypoxic respiratory failure secondary to acute asthma exacerbation.  Started on Solu-Medrol as well as albuterol.  Subsequently weaned down to room air.  Albuterol was switched to scheduled per patient's request.  She has improved significantly today.  She is off of oxygen.  Lungs are clear to  auscultation with no wheezes.  She is ready to go home and is being discharged home in stable condition with 3 more days of oral steroids.  PT OT was consulted however she has not been seen by them yet but patient is walking around in her room without requiring any help.  Of note, admitting hospitalist had suspected dermatomyositis or polymyositis for presumed focal weakness but patient denied having any focal weakness.  She was very clear in telling me that she has generalized weakness which is improved with improvement of her asthma exacerbation.  Discharge Diagnoses:  Principal Problem:   Asthma exacerbation Active Problems:   Hypertension   Depression   Hyperlipidemia   Ulcerative colitis ([place])   Fatigue   Morbid obesity (HCC)   Muscle weakness   Grade I diastolic dysfunction   Acute asthma exacerbation   ========================== Hospital course discussed with patient.  She was short of breath then admitted with asthma exacerbation.  She was treated with steroids in the meantime.  She improved to the point where she was able to be discharged.  She has trouble swallowing potassium tabs. D/w pt about options.  See orders.  Last oral steroid was 2 days ago.  Chest feels tight but improved from admission.  Last neb was this AM.  Used SABA prior to OV here. Rinsing after neb use.  She noted irritation in her mouth.    She was prev on symbicort and that seemed to work well for patient.    She had  rx for current meds prior to admission.  She was compliant prior to admission.    Weight is stable.    Meds, vitals, and allergies reviewed.   ROS: Per HPI unless specifically indicated in ROS section   GEN: nad, alert and oriented HEENT: mucous membranes moist, mild irritation noted in the mouth but no ulceration and no thrush. NECK: supple w/o LA CV: rrr.  PULM: ctab, no inc wob, no wheeze. ABD: soft, +bs EXT: no edema SKIN: Well-perfused.

## 2020-01-15 NOTE — Patient Instructions (Addendum)
Go to the lab on the way out.   If you have mychart we'll likely use that to update you.    If worse in the meantime, go to the ER.  Keep using duoneb as is.  Double the dose of pulmicort neb.    In the meantime, try to get symbicort filled.  If you can't get it, let me know.  We'll see about substitutions.  If/when you get symbicort, then stop pulmicort neb and change to symbicort.    Take care.  Glad to see you.

## 2020-01-16 ENCOUNTER — Telehealth: Payer: Self-pay | Admitting: *Deleted

## 2020-01-16 LAB — BASIC METABOLIC PANEL
BUN: 36 mg/dL — ABNORMAL HIGH (ref 6–23)
CO2: 36 mEq/L — ABNORMAL HIGH (ref 19–32)
Calcium: 9.7 mg/dL (ref 8.4–10.5)
Chloride: 88 mEq/L — ABNORMAL LOW (ref 96–112)
Creatinine, Ser: 1.48 mg/dL — ABNORMAL HIGH (ref 0.40–1.20)
GFR: 37.06 mL/min — ABNORMAL LOW (ref >60.00)
Glucose, Bld: 480 mg/dL — ABNORMAL HIGH (ref 70–99)
Potassium: 4.8 mEq/L (ref 3.5–5.1)
Sodium: 134 mEq/L — ABNORMAL LOW (ref 135–145)

## 2020-01-16 LAB — BRAIN NATRIURETIC PEPTIDE: Pro B Natriuretic peptide (BNP): 51 pg/mL (ref 0.0–100.0)

## 2020-01-16 NOTE — Telephone Encounter (Signed)
Gerald Stabs pharmacist with [facility] left a voicemail stating that they faxed over a case management form on the patient and wanted to make sure that the doctor got it.

## 2020-01-17 NOTE — Telephone Encounter (Signed)
Please see what details you can get.  I don't have the form and I don't recall seeing it.  Thanks.

## 2020-01-18 ENCOUNTER — Other Ambulatory Visit: Payer: Self-pay | Admitting: Family Medicine

## 2020-01-18 DIAGNOSIS — E119 Type 2 diabetes mellitus without complications: Secondary | ICD-10-CM

## 2020-01-18 NOTE — Assessment & Plan Note (Signed)
Recent exacerbation, requiring inpatient treatment, improved compared to admission.  Still okay for outpatient follow-up.  See notes on labs.  Routine cautions given to patient.  If worse in the meantime, go to the ER.  Keep using duoneb as is.  Double the dose of pulmicort neb.  She will see if that helps in the meantime.  In the meantime, she will try to get symbicort filled.  If she can't get it, then she will let me know.  We'll see about substitutions at that point if needed.  If/when she gets symbicort, then stop pulmicort neb and change to symbicort.

## 2020-01-18 NOTE — Telephone Encounter (Signed)
Called and left message for E. King with [facility] to call back to follow-up regarding case management fax.

## 2020-01-19 ENCOUNTER — Other Ambulatory Visit: Payer: Self-pay

## 2020-01-19 ENCOUNTER — Other Ambulatory Visit: Payer: Medicare HMO | Admitting: *Deleted

## 2020-01-19 DIAGNOSIS — N1831 Chronic kidney disease, stage 3a: Secondary | ICD-10-CM

## 2020-01-19 DIAGNOSIS — I10 Essential (primary) hypertension: Secondary | ICD-10-CM

## 2020-01-19 DIAGNOSIS — I5032 Chronic diastolic (congestive) heart failure: Secondary | ICD-10-CM | POA: Diagnosis not present

## 2020-01-19 DIAGNOSIS — R0602 Shortness of breath: Secondary | ICD-10-CM | POA: Diagnosis not present

## 2020-01-20 LAB — BASIC METABOLIC PANEL
BUN/Creatinine Ratio: 17 (ref 12–28)
BUN: 17 mg/dL (ref 8–27)
CO2: 24 mmol/L (ref 20–29)
Calcium: 9.1 mg/dL (ref 8.7–10.3)
Chloride: 98 mmol/L (ref 96–106)
Creatinine, Ser: 0.99 mg/dL (ref 0.57–1.00)
GFR calc Af Amer: 69 mL/min/{1.73_m2} (ref >59)
GFR calc non Af Amer: 60 mL/min/{1.73_m2} (ref >59)
Glucose: 270 mg/dL — ABNORMAL HIGH (ref 65–99)
Potassium: 4.7 mmol/L (ref 3.5–5.2)
Sodium: 138 mmol/L (ref 134–144)

## 2020-01-22 ENCOUNTER — Other Ambulatory Visit: Payer: Self-pay

## 2020-01-22 ENCOUNTER — Other Ambulatory Visit (INDEPENDENT_AMBULATORY_CARE_PROVIDER_SITE_OTHER): Payer: Medicare HMO

## 2020-01-22 DIAGNOSIS — J45901 Unspecified asthma with (acute) exacerbation: Secondary | ICD-10-CM | POA: Diagnosis not present

## 2020-01-22 DIAGNOSIS — R0602 Shortness of breath: Secondary | ICD-10-CM | POA: Diagnosis not present

## 2020-01-22 DIAGNOSIS — G4733 Obstructive sleep apnea (adult) (pediatric): Secondary | ICD-10-CM | POA: Diagnosis not present

## 2020-01-22 LAB — CBC WITH DIFFERENTIAL/PLATELET
Basophils Absolute: 0.1 10*3/uL (ref 0.0–0.1)
Basophils Relative: 0.5 % (ref 0.0–3.0)
Eosinophils Absolute: 0.1 10*3/uL (ref 0.0–0.7)
Eosinophils Relative: 1.3 % (ref 0.0–5.0)
HCT: 39.2 % (ref 36.0–46.0)
Hemoglobin: 13.1 g/dL (ref 12.0–15.0)
Lymphocytes Relative: 29.3 % (ref 12.0–46.0)
Lymphs Abs: 3.1 10*3/uL (ref 0.7–4.0)
MCHC: 33.4 g/dL (ref 30.0–36.0)
MCV: 88.8 fl (ref 78.0–100.0)
Monocytes Absolute: 1.1 10*3/uL — ABNORMAL HIGH (ref 0.1–1.0)
Monocytes Relative: 10 % (ref 3.0–12.0)
Neutro Abs: 6.3 10*3/uL (ref 1.4–7.7)
Neutrophils Relative %: 58.9 % (ref 43.0–77.0)
Platelets: 325 10*3/uL (ref 150.0–400.0)
RBC: 4.41 Mil/uL (ref 3.87–5.11)
RDW: 16.8 % — ABNORMAL HIGH (ref 11.5–15.5)
WBC: 10.7 10*3/uL — ABNORMAL HIGH (ref 4.0–10.5)

## 2020-01-22 LAB — BASIC METABOLIC PANEL
BUN: 21 mg/dL (ref 6–23)
CO2: 32 mEq/L (ref 19–32)
Calcium: 9.4 mg/dL (ref 8.4–10.5)
Chloride: 102 mEq/L (ref 96–112)
Creatinine, Ser: 1.24 mg/dL — ABNORMAL HIGH (ref 0.40–1.20)
GFR: 45.83 mL/min — ABNORMAL LOW (ref >60.00)
Glucose, Bld: 216 mg/dL — ABNORMAL HIGH (ref 70–99)
Potassium: 4.7 mEq/L (ref 3.5–5.1)
Sodium: 141 mEq/L (ref 135–145)

## 2020-02-05 NOTE — Progress Notes (Signed)
Cardiology Office Note:    Date:  02/06/2020   ID:  Kathleen Hatfield, DOB Jul 23, 1955, MRN [id number]  PCP:  [name], MD  [facility] Cardiologist:  [name], MD   [facility] Electrophysiologist:  None   Referring MD: [name], MD   Chief Complaint:  Follow-up (CHF)    Patient Profile:    Kathleen Hatfield is a 64 y.o. female with:   Chronic diastolic CHF ? Echocardiogram 05/2019: EF 70, mild LVH, Gr 1 DD, severe RVH ? 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 Intolerant of statins  GERD  Diverticulitis s/p partial colectomy  Prior CV studies: Cardiac MRI 01/01/20 IMPRESSION: 1. Normal LV size with mild LV hypertrophy, hyperdynamic systolic function with EF 78%.  2. Normal RV size and systolic function, EF 37%. There does not appear to be true RV hypertrophy present. There is very thick epicardial adipose tissue that may have been mistaken as RVH on the echo.  3. There was a small area of mid-wall LGE at the RV insertion site. This is nonspecific and may be suggestive of elevated filling pressures.  Echocardiogram 05/18/2019 EF 70, no RWMA, mild LVH, Gr 1 DD, normal RVSF, severe RVH, Asc Ao 39 mm  Myoview 06/07/2019 EF 83, no ischemia or infarction; Low Risk  Echocardiogram 04/03/2016 Mild LVH, EF 65-70, no RWMA, Gr 1 DD, LVOT gradient (2.6 m/s) likely related to hyperdynamic LVF  Myoview 06/23/2012 No ischemia, EF 82; Low Risk  History of Present Illness:    Kathleen Hatfield was last seen in September 2021.  I reviewed her previous echo with Dr. Burt Knack and Dr. Aundra Dubin.  Given her RVH noted on echocardiogram, we proceeded with a cardiac MRI.  This demonstrated EF 78%.  There was no true RVH  present on cardiac MRI.  There was very thick epicardial adipose tissue which may have been mistaken for RVH on echocardiogram.  I adjusted her diuretics by discontinuing furosemide and as needed metolazone and placing her back on torsemide 40 mg daily.  She was then admitted earlier this month with acute hypoxic respiratory failure secondary to acute asthma exacerbation.  She was treated with bronchodilators and Solu-Medrol with improved symptoms.    She returns for follow-up.  She is here alone.  She has not had chest pain, syncope.  She sleeps on an incline chronically.  She has not had significant leg edema.  She is chronically short of breath.  Her breathing was better when she left the hospital.  It seems a little more short now.  Her weights have been stable since switching to Torsemide.        Past Medical History:  Diagnosis Date  . Anemia   . Arthritis   . Asthma   . Chronic diastolic CHF 90/2409   Echocardiogram 05/2019: EF 70, no RWMA, mild LVH, Gr 1 DD, normal RVSF, severe LVH, borderline asc Aorta (39 mm)  . CKD (chronic kidney disease)   . Depression   . Diabetes mellitus without complication ([place])   . Diverticulitis   . Dyspnea    with exertion  . GERD (gastroesophageal reflux disease)   . History of blood transfusion   . Hyperlipidemia    cannot tolerate statins  . Hypertension   .  Nuclear stress test    Myoview 05/2019: EF 83, no ischemia or infarction; Low Risk  . Peripheral neuropathy   . Pneumonia   . PONV (postoperative nausea and vomiting)   . Sleep apnea    uses CPAP  . Ulcerative colitis ([place])    dr Collene Mares    Current Medications: Current Meds  Medication Sig  . acetaminophen (TYLENOL) 500 MG tablet Take 1,000 mg by mouth every 6 (six) hours as needed for pain.  Marland Kitchen albuterol (PROVENTIL HFA;VENTOLIN HFA) 108 (90 Base) MCG/ACT inhaler Inhale 2 puffs into the lungs every 3 (three) hours as needed for wheezing or shortness of breath.   Marland Kitchen BAYER CONTOUR NEXT TEST  test strip 1 each by Other route daily. As directed  . budesonide-formoterol (SYMBICORT) 160-4.5 MCG/ACT inhaler Inhale 2 puffs into the lungs 2 (two) times daily. Rinse after use.  Marland Kitchen buPROPion (WELLBUTRIN XL) 300 MG 24 hr tablet TAKE 1 TABLET BY MOUTH DAILY  . Cholecalciferol (VITAMIN D-3) 5000 UNITS TABS Take 5,000 Units by mouth every other day.   . dicyclomine (BENTYL) 20 MG tablet TAKE 1 TABLET UP TO FOUR TIMES A DAY FOR GI CRAMPING, PAIN, AND NAUSEA/VOMITING AS NEEDED  . DULoxetine (CYMBALTA) 60 MG capsule TAKE 1 CAPSULE BY MOUTH ONCE DAILY.  Marland Kitchen gabapentin (NEURONTIN) 100 MG capsule TAKE 3 CAPSULES BY MOUTH AT BEDTIME.  Marland Kitchen GLOBAL EASE INJECT PEN NEEDLES 32G X 4 MM MISC 1 each by Other route daily.   . insulin degludec (TRESIBA FLEXTOUCH) 200 UNIT/ML FlexTouch Pen Inject 88 Units into the skin daily.  Marland Kitchen ipratropium-albuterol (DUONEB) 0.5-2.5 (3) MG/3ML SOLN Take 3 mLs by nebulization See admin instructions. Nebulize 3 ml's and inhale into the lungs two to three times a day  . mesalamine (LIALDA) 1.2 g EC tablet Take 1.2 g by mouth daily with breakfast. Take 1.2 grams by mouth once a day with breakfast- increase as directed  . MICROLET LANCETS MISC 1 each by Other route. As directed  . ondansetron (ZOFRAN ODT) 4 MG disintegrating tablet Take 1 tablet (4 mg total) by mouth every 8 (eight) hours as needed for nausea or vomiting.  . pantoprazole (PROTONIX) 40 MG tablet Take 40 mg by mouth daily before breakfast.   . polyethylene glycol (MIRALAX / GLYCOLAX) packet Take 17 g by mouth daily as needed for mild constipation (MIX AND DRINK AS DIRECTED).   Marland Kitchen potassium chloride (KLOR-CON) 10 MEQ tablet Take 6 tablets (60 mEq total) by mouth 2 (two) times daily. Disp capsules if possible  . propranolol (INDERAL) 10 MG tablet Take 1 tablet (10 mg total) by mouth 2 (two) times daily.  Marland Kitchen rOPINIRole (REQUIP) 4 MG tablet TAKE 1 TABLET BY MOUTH TWICE DAILY  . spironolactone (ALDACTONE) 25 MG tablet TAKE 1 TABLET  BY MOUTH DAILY  . torsemide (DEMADEX) 20 MG tablet Take 2 tablets (40 mg total) by mouth daily.  . traMADol (ULTRAM) 50 MG tablet Take 50 mg by mouth daily as needed for pain.  Marland Kitchen UNABLE TO FIND CPAP- At bedtime  . VICTOZA 18 MG/3ML SOPN INJECT 1.8MG UNDER THE SKIN DAILY     Allergies:   Almond oil, Morphine and related, Atorvastatin, Ceclor [cefaclor], Ciprofloxacin, Levaquin [levofloxacin], Losartan, Statins, and Sulfa antibiotics   Social History   Tobacco Use  . Smoking status: Never Smoker  . Smokeless tobacco: Never Used  Vaping Use  . Vaping Use: Never used  Substance Use Topics  . Alcohol use: No  . Drug use: No  Family Hx: The patient's family history includes COPD in her father; Dementia in her mother; Heart disease in her brother, father, and mother; Hypertension in her father and mother; Other in her brother and brother. There is no history of Breast cancer or Colon cancer.  ROS   EKGs/Labs/Other Test Reviewed:    EKG:  EKG is not ordered today.  The ekg ordered today demonstrates n/a  Recent Labs: 06/09/2019: TSH 2.850 06/22/2019: ALT 16 01/08/2020: B Natriuretic Peptide 58.9 01/10/2020: Magnesium 1.8 01/15/2020: Pro B Natriuretic peptide (BNP) 51.0 01/22/2020: BUN 21; Creatinine, Ser 1.24; Hemoglobin 13.1; Platelets 325.0; Potassium 4.7; Sodium 141   Recent Lipid Panel Lab Results  Component Value Date/Time   CHOL 333 04/26/2019 12:00 AM   TRIG 469 (A) 04/26/2019 12:00 AM   HDL 34 (L) 03/25/2013 05:00 AM   CHOLHDL 6.9 03/25/2013 05:00 AM   LDLCALC 133 (H) 03/25/2013 05:00 AM      Risk Assessment/Calculations:      Physical Exam:    VS:  BP 120/70   Pulse 90   Ht 5' 5"  (1.651 m)   Wt 264 lb (119.7 kg)   SpO2 94%   BMI 43.93 kg/m     Wt Readings from Last 3 Encounters:  02/06/20 264 lb (119.7 kg)  01/15/20 260 lb (117.9 kg)  01/08/20 261 lb 14.5 oz (118.8 kg)     Constitutional:      Appearance: Healthy appearance. Not in distress.    Pulmonary:     Breath sounds: No wheezing. No rales.  Cardiovascular:     Normal rate. Regular rhythm. Normal S1. Normal S2.     Murmurs: There is no murmur.  Edema:    Ankle: bilateral trace edema of the ankle. Musculoskeletal:     Cervical back: Neck supple. Skin:    General: Skin is warm and dry.  Neurological:     General: No focal deficit present.     Mental Status: Alert and oriented to person, place and time.       ASSESSMENT & PLAN:    1. Chronic heart failure with preserved ejection fraction [facility]) We reviewed the findings on her cardiac MRI.  Overall volume status is stable.  I advised her to take an extra Torsemide if she has a >/= 3 lb weight gain in 24 hours or 5 lbs in 1 week.  She is limiting her salt.   2. Essential hypertension The patient's blood pressure is controlled on her current regimen.  Continue current therapy.    3. Stage 3a chronic kidney disease (HCC) Recent creatinine stable.   4. Chronic obstructive pulmonary disease, unspecified COPD type ([place]) This seems to be the main factor in contributing to her shortness of breath.  I have suggested she f/u with pulmonology.   5. Morbid obesity (HCC) BMI 43.93.  She has a hard time losing weight.  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  Refer to the [facility] clinic.       Dispo:  Return in 4 months (on 05/30/2020) for Scheduled Follow Up, w/ Dr. Burt Knack, in person.   Medication Adjustments/Labs and Tests Ordered: Current medicines are reviewed at length with the patient today.  Concerns regarding medicines are outlined above.  Tests Ordered: Orders Placed This Encounter  Procedures  . Amb Ref to Medical Weight Management   Medication Changes: No orders of the defined types were placed in this encounter.   Signed, Richardson Dopp, PA-C  02/06/2020  4:47 PM    [facility] [address], [place], [place]  [address] Phone: [phone];  Fax: [phone]

## 2020-02-06 ENCOUNTER — Encounter: Payer: Self-pay | Admitting: Physician Assistant

## 2020-02-06 ENCOUNTER — Ambulatory Visit: Payer: Medicare HMO | Admitting: Physician Assistant

## 2020-02-06 ENCOUNTER — Other Ambulatory Visit: Payer: Self-pay

## 2020-02-06 VITALS — BP 120/70 | HR 90 | Ht 65.0 in | Wt 264.0 lb

## 2020-02-06 DIAGNOSIS — I5032 Chronic diastolic (congestive) heart failure: Secondary | ICD-10-CM

## 2020-02-06 DIAGNOSIS — I10 Essential (primary) hypertension: Secondary | ICD-10-CM

## 2020-02-06 DIAGNOSIS — J449 Chronic obstructive pulmonary disease, unspecified: Secondary | ICD-10-CM | POA: Diagnosis not present

## 2020-02-06 DIAGNOSIS — N1831 Chronic kidney disease, stage 3a: Secondary | ICD-10-CM | POA: Diagnosis not present

## 2020-02-06 NOTE — Patient Instructions (Addendum)
Medication Instructions:  Your physician recommends that you continue on your current medications as directed. Please refer to the Current Medication list given to you today.  *If you need a refill on your cardiac medications before your next appointment, please call your pharmacy*  Lab Work: None ordered today  If you have labs (blood work) drawn today and your tests are completely normal, you will receive your results only by: Marland Kitchen MyChart Message (if you have MyChart) OR . A paper copy in the mail If you have any lab test that is abnormal or we need to change your treatment, we will call you to review the results.  Testing/Procedures: None ordered toay  Follow-Up: On 05/30/2020 at 2:40PM with [name], MD

## 2020-02-14 ENCOUNTER — Other Ambulatory Visit: Payer: Self-pay | Admitting: Family Medicine

## 2020-02-15 ENCOUNTER — Other Ambulatory Visit: Payer: Self-pay | Admitting: Family Medicine

## 2020-02-15 DIAGNOSIS — R69 Illness, unspecified: Secondary | ICD-10-CM | POA: Diagnosis not present

## 2020-02-15 NOTE — Telephone Encounter (Signed)
Ok to refill. Please Advise

## 2020-02-16 NOTE — Telephone Encounter (Signed)
Okay to continue.  Sent. Thanks.

## 2020-02-20 ENCOUNTER — Telehealth: Payer: Self-pay | Admitting: Physician Assistant

## 2020-02-20 MED ORDER — TORSEMIDE 20 MG PO TABS
40.0000 mg | ORAL_TABLET | Freq: Every day | ORAL | 3 refills | Status: DC
Start: 2020-02-20 — End: 2020-05-13

## 2020-02-20 NOTE — Telephone Encounter (Signed)
° ° ° °  Pt c/o medication issue:  1. Name of Medication: torsemide (DEMADEX) 20 MG tablet  2. How are you currently taking this medication (dosage and times per day)? Take 2 tablets (40 mg total) by mouth daily.  3. Are you having a reaction (difficulty breathing--STAT)?  4. What is your medication issue? Pt said Scott increased her Torsemide to 3 a day. She is out of merds and pharmacy won't refill it. [id number] [place]

## 2020-02-20 NOTE — Telephone Encounter (Signed)
Pt aware refill sent in as requested ./cy

## 2020-02-28 DIAGNOSIS — G4733 Obstructive sleep apnea (adult) (pediatric): Secondary | ICD-10-CM | POA: Diagnosis not present

## 2020-03-19 DIAGNOSIS — N1831 Chronic kidney disease, stage 3a: Secondary | ICD-10-CM | POA: Diagnosis not present

## 2020-03-20 ENCOUNTER — Other Ambulatory Visit: Payer: Self-pay | Admitting: Family Medicine

## 2020-03-20 ENCOUNTER — Encounter: Payer: Self-pay | Admitting: Family Medicine

## 2020-03-20 DIAGNOSIS — G2581 Restless legs syndrome: Secondary | ICD-10-CM | POA: Insufficient documentation

## 2020-03-20 NOTE — Telephone Encounter (Signed)
Sent. Thanks.  Needs f/u set for this spring.

## 2020-03-20 NOTE — Telephone Encounter (Signed)
Please Advise

## 2020-03-22 DIAGNOSIS — N1831 Chronic kidney disease, stage 3a: Secondary | ICD-10-CM | POA: Diagnosis not present

## 2020-03-22 DIAGNOSIS — E1122 Type 2 diabetes mellitus with diabetic chronic kidney disease: Secondary | ICD-10-CM | POA: Diagnosis not present

## 2020-03-22 DIAGNOSIS — N183 Chronic kidney disease, stage 3 unspecified: Secondary | ICD-10-CM | POA: Diagnosis not present

## 2020-03-22 DIAGNOSIS — Z6841 Body Mass Index (BMI) 40.0 and over, adult: Secondary | ICD-10-CM | POA: Diagnosis not present

## 2020-03-22 DIAGNOSIS — Z794 Long term (current) use of insulin: Secondary | ICD-10-CM | POA: Diagnosis not present

## 2020-03-22 DIAGNOSIS — I129 Hypertensive chronic kidney disease with stage 1 through stage 4 chronic kidney disease, or unspecified chronic kidney disease: Secondary | ICD-10-CM | POA: Diagnosis not present

## 2020-04-02 NOTE — Telephone Encounter (Signed)
Called and it was just ringing and ringing. wil try again tomorrow

## 2020-04-03 NOTE — Telephone Encounter (Signed)
Thank you for trying.  Please try to contact her later this week.  I appreciate it.

## 2020-04-10 ENCOUNTER — Encounter: Payer: Self-pay | Admitting: Family Medicine

## 2020-04-14 ENCOUNTER — Encounter (INDEPENDENT_AMBULATORY_CARE_PROVIDER_SITE_OTHER): Payer: Self-pay

## 2020-04-22 ENCOUNTER — Telehealth: Payer: Medicare HMO | Admitting: Emergency Medicine

## 2020-04-22 DIAGNOSIS — M109 Gout, unspecified: Secondary | ICD-10-CM

## 2020-04-22 MED ORDER — COLCHICINE 0.6 MG PO TABS: 0.6000 mg | ORAL_TABLET | Freq: Two times a day (BID) | ORAL | 0 refills | Status: DC

## 2020-04-22 NOTE — Progress Notes (Signed)
E-Visit for Gout  We are sorry that you are not feeling well. We are here to help!  Based on what you shared with me it looks like you have a flare of your gout.  Gout is a form of arthritis. It can cause pain and swelling in the joints. At first, it tends to affect only 1 joint - most frequently the big toe. It happens in people who have too much uric acid in the blood. Uric acid is a chemical that is produced when the body breaks down certain foods. Uric acid can form sharp needle-like crystals that build up in the joints and cause pain. Uric acid crystals can also form inside the tubes that carry urine from the kidneys to the bladder. These crystals can turn into "kidney stones" that can cause pain and problems with the flow of urine. People with gout get sudden "flares" or attacks of severe pain, most often the big toe, ankle, or knee. Often the joint also turns red and swells. Usually, only 1 joint is affected, but some people have pain in more than 1 joint. Gout flares tend to happen more often during the night.  The pain from gout can be extreme. The pain and swelling are worst at the beginning of a gout flare. The symptoms then get better within a few days to weeks. It is not clear how the body "turns off" a gout flare.  Do not start any NEW preventative medicine until the gout has cleared completely. However, If you are already on Probenecid or Allopurinol for CHRONIC gout, you may continue taking this during an active flare up  I have prescribed Colchicine 0.6 mg tabs - Take 2 tabs immediately, then 1 tab twice per day for the duration of the flare up to a max of 7 days (but discontinue for stomach pains or diarrhea)    HOME CARE Losing weight can help relieve gout. It's not clear that following a specific diet plan will help with gout symptoms but eating a balanced diet can help improve your overall health. It can also help you lose weight, if you are overweight. In general, a healthy diet  includes plenty of fruits, vegetables, whole grains, and low-fat dairy products (labelled "low fat", skim, 2%). Avoid sugar sweetened drinks (including sodas, tea, juice and juice blends, coffee drinks and sports drinks) Limit alcohol to 1-2 drinks of beer, spirits or wine daily these can make gout flares worse. Some people with gout also have other health problems, such as heart disease, high blood pressure, kidney disease, or obesity. If you have any of these issues, it's important to work with your doctor to manage them. This can help improve your overall health and might also help with your gout.  GET HELP RIGHT AWAY IF: . Your symptoms persist after you have completed your treatment plan . You develop severe diarrhea . You develop abnormal sensations .  You develop vomiting,  .  You develop weakness .  You develop abdominal pain  FOLLOW UP WITH YOUR PRIMARY PROVIDER IF: . If your symptoms do not improve within 10 days  MAKE SURE YOU   Understand these instructions.  Will watch your condition.  Will get help right away if you are not doing well or get worse.  Your e-visit answers were reviewed by a board certified advanced clinical practitioner to complete your personal care plan. Depending upon the condition, your plan could have included both over the counter or prescription medications.  Your  safety is important to [place]. If you have drug allergies check your prescription carefully.   You can use MyChart to ask questions about today's visit, request a non-urgent call back, or ask for a work or school excuse for 24 hours related to this e-Visit. If it has been greater than 24 hours you will need to follow up with your provider, or enter a new e-Visit to address those concerns. You will get an e-mail with a link to a survey asking about your experience.  We hope that your e-visit has been valuable and will speed your recovery! Thank you for using e-visits.      Approximately 5  minutes was used in reviewing the patient's chart, questionnaire, prescribing medications, and documentation.

## 2020-04-23 ENCOUNTER — Telehealth: Payer: Medicare HMO | Admitting: Physician Assistant

## 2020-04-23 DIAGNOSIS — R509 Fever, unspecified: Secondary | ICD-10-CM

## 2020-04-23 DIAGNOSIS — M25579 Pain in unspecified ankle and joints of unspecified foot: Secondary | ICD-10-CM

## 2020-04-23 MED ORDER — INDOMETHACIN 50 MG PO CAPS: 50.0000 mg | ORAL_CAPSULE | Freq: Three times a day (TID) | ORAL | 0 refills | Status: DC

## 2020-04-23 NOTE — Progress Notes (Signed)
E-Visit for Gout  We are sorry that you are not feeling well. We are here to help! Giving history of gout and notation that this is similar to prior episodes I will switch you to Indomethacin -- see below, especially as I can see in your chart you have taken before. As discussed if no substantial improvement or if you develop a true fever, you need to be seen in person ASAP.   Based on what you shared with me it looks like you have a flare of your gout.  Gout is a form of arthritis. It can cause pain and swelling in the joints. At first, it tends to affect only 1 joint - most frequently the big toe. It happens in people who have too much uric acid in the blood. Uric acid is a chemical that is produced when the body breaks down certain foods. Uric acid can form sharp needle-like crystals that build up in the joints and cause pain. Uric acid crystals can also form inside the tubes that carry urine from the kidneys to the bladder. These crystals can turn into "kidney stones" that can cause pain and problems with the flow of urine. People with gout get sudden "flares" or attacks of severe pain, most often the big toe, ankle, or knee. Often the joint also turns red and swells. Usually, only 1 joint is affected, but some people have pain in more than 1 joint. Gout flares tend to happen more often during the night.  The pain from gout can be extreme. The pain and swelling are worst at the beginning of a gout flare. The symptoms then get better within a few days to weeks. It is not clear how the body "turns off" a gout flare.  Do not start any NEW preventative medicine until the gout has cleared completely. However, If you are already on Probenecid or Allopurinol for CHRONIC gout, you may continue taking this during an active flare up  I have prescribed Indomethacin 54m three times daily for moderate to severe pain for no more than 7 days   HOME CARE Losing weight can help relieve gout. It's not clear that  following a specific diet plan will help with gout symptoms but eating a balanced diet can help improve your overall health. It can also help you lose weight, if you are overweight. In general, a healthy diet includes plenty of fruits, vegetables, whole grains, and low-fat dairy products (labelled "low fat", skim, 2%). Avoid sugar sweetened drinks (including sodas, tea, juice and juice blends, coffee drinks and sports drinks) Limit alcohol to 1-2 drinks of beer, spirits or wine daily these can make gout flares worse. Some people with gout also have other health problems, such as heart disease, high blood pressure, kidney disease, or obesity. If you have any of these issues, it's important to work with your doctor to manage them. This can help improve your overall health and might also help with your gout.  GET HELP RIGHT AWAY IF: . Your symptoms persist after you have completed your treatment plan . You develop severe diarrhea . You develop abnormal sensations .  You develop vomiting,  .  You develop weakness .  You develop abdominal pain  FOLLOW UP WITH YOUR PRIMARY PROVIDER IF: . If your symptoms do not improve within 10 days  MAKE SURE YOU   Understand these instructions.  Will watch your condition.  Will get help right away if you are not doing well or get worse.  Your e-visit answers were reviewed by a board certified advanced clinical practitioner to complete your personal care plan. Depending upon the condition, your plan could have included both over the counter or prescription medications.  Your safety is important to [place]. If you have drug allergies check your prescription carefully.   You can use MyChart to ask questions about today's visit, request a non-urgent call back, or ask for a work or school excuse for 24 hours related to this e-Visit. If it has been greater than 24 hours you will need to follow up with your provider, or enter a new e-Visit to address those concerns. You  will get an e-mail with a link to a survey asking about your experience.  We hope that your e-visit has been valuable and will speed your recovery! Thank you for using e-visits.

## 2020-04-23 NOTE — Progress Notes (Signed)
Based on what you shared with me, I feel your condition warrants further evaluation and I recommend that you be seen for a face to face office visit.  Hi [name],  Reviewing your e-visit questionnaire you mentioned fever. If you are having a fever along with the other symptoms that is concerning that it may be more than just your typical gout flare and you need assessment in person to make sure there is no concern for a joint infection itself, especially if the colchicine given yesterday is not helping.    NOTE: If you entered your credit card information for this eVisit, you will not be charged. You may see a "hold" on your card for the $35 but that hold will drop off and you will not have a charge processed.   If you are having a true medical emergency please call 911.      For an urgent face to face visit, [place] has five urgent care centers for your convenience:     [facility] [place] [facility] [place] Get Driving Directions [phone] [place], [place] [address] . 10 am - 6pm Monday - Friday    [facility]) Get Driving Directions [phone] [address] [place], [place] [address] . 10 am to 8 pm Monday-Friday . 12 pm to 8 pm [facility] Get Driving Directions [phone] [place], [place], [place] [address] . 8 am to 8 pm Monday-Friday . 9 am to 6 pm Saturday . 11 am to 6 pm Sunday     [facility] Get Driving Directions  [phone] [address] [place], [place] [address] . 8 am to 8 pm Monday-Friday . 8 am to 4 pm [facility] at [place] Get Driving Directions [phone] [address] [place], [place] [address] . 12 pm to 6 pm Monday-Friday      Your e-visit answers were reviewed by a board certified advanced clinical  practitioner to complete your personal care plan.  Thank you for using e-Visits.

## 2020-04-23 NOTE — Addendum Note (Signed)
Addended by: Brunetta Jeans on: 04/23/2020 12:34 PM   Modules accepted: Orders

## 2020-04-25 DIAGNOSIS — Z794 Long term (current) use of insulin: Secondary | ICD-10-CM | POA: Diagnosis not present

## 2020-04-25 DIAGNOSIS — E261 Secondary hyperaldosteronism: Secondary | ICD-10-CM | POA: Diagnosis not present

## 2020-04-25 DIAGNOSIS — I509 Heart failure, unspecified: Secondary | ICD-10-CM | POA: Diagnosis not present

## 2020-04-25 DIAGNOSIS — Z6841 Body Mass Index (BMI) 40.0 and over, adult: Secondary | ICD-10-CM | POA: Diagnosis not present

## 2020-04-25 DIAGNOSIS — E1165 Type 2 diabetes mellitus with hyperglycemia: Secondary | ICD-10-CM | POA: Diagnosis not present

## 2020-04-25 DIAGNOSIS — E1140 Type 2 diabetes mellitus with diabetic neuropathy, unspecified: Secondary | ICD-10-CM | POA: Diagnosis not present

## 2020-04-25 DIAGNOSIS — K5190 Ulcerative colitis, unspecified, without complications: Secondary | ICD-10-CM | POA: Diagnosis not present

## 2020-04-25 DIAGNOSIS — J449 Chronic obstructive pulmonary disease, unspecified: Secondary | ICD-10-CM | POA: Diagnosis not present

## 2020-04-25 DIAGNOSIS — R69 Illness, unspecified: Secondary | ICD-10-CM | POA: Diagnosis not present

## 2020-04-26 ENCOUNTER — Telehealth: Payer: Self-pay | Admitting: Family Medicine

## 2020-04-26 NOTE — Telephone Encounter (Signed)
Kathleen Hatfield called and when she did a home visit through [facility] she had asked about her sugar levels and the last time she pricked herself was on 04/12/20 and it was 100 and when she did yesterday she told her that to do it and it was 500 but she takes her medication and she doesn't have symptoms and she is taking medicine for gout and  she stated that she knows when she feels like she is having an reaction and the nurse just wanted to have a nurse call her and check on her

## 2020-04-26 NOTE — Telephone Encounter (Signed)
Please check on patient and have her recheck sugar.  If she is having sig sugar elevations (ie >400), then she needs to get checked.  If >300, verify current insulin dose.  Okay to add 2 units per day until AM sugar is below 200.    And either way, needs DM2 f/u scheduled here. Thanks.

## 2020-04-26 NOTE — Telephone Encounter (Signed)
LMTCB

## 2020-04-26 NOTE — Telephone Encounter (Signed)
Spoke with patient. She is taking 88 units of [place] daily. Advised patient to add 2 units per day until sugars in am are below 200; patient agrees. She checked her BS while I was on the phone with her and BS was 285. I scheduled her a f/u on 05/06/20 at 2:30 pm. Advised patient to call back if BS get out of hand again.

## 2020-04-28 NOTE — Telephone Encounter (Signed)
Noted. Thanks.

## 2020-05-06 ENCOUNTER — Other Ambulatory Visit: Payer: Self-pay

## 2020-05-06 ENCOUNTER — Ambulatory Visit (INDEPENDENT_AMBULATORY_CARE_PROVIDER_SITE_OTHER): Payer: Medicare HMO | Admitting: Family Medicine

## 2020-05-06 ENCOUNTER — Encounter: Payer: Self-pay | Admitting: Family Medicine

## 2020-05-06 VITALS — BP 110/70 | HR 87 | Temp 97.8°F | Ht 65.0 in | Wt 258.0 lb

## 2020-05-06 DIAGNOSIS — E119 Type 2 diabetes mellitus without complications: Secondary | ICD-10-CM | POA: Diagnosis not present

## 2020-05-06 DIAGNOSIS — I5032 Chronic diastolic (congestive) heart failure: Secondary | ICD-10-CM | POA: Diagnosis not present

## 2020-05-06 DIAGNOSIS — G2581 Restless legs syndrome: Secondary | ICD-10-CM | POA: Diagnosis not present

## 2020-05-06 LAB — POCT GLYCOSYLATED HEMOGLOBIN (HGB A1C): Hemoglobin A1C: 11.5 % — AB (ref 4.0–5.6)

## 2020-05-06 MED ORDER — POTASSIUM CHLORIDE ER 10 MEQ PO CPCR: 30.0000 meq | ORAL_CAPSULE | Freq: Two times a day (BID) | ORAL | 3 refills | Status: DC

## 2020-05-06 MED ORDER — ONDANSETRON HCL 4 MG PO TABS: 4.0000 mg | ORAL_TABLET | Freq: Three times a day (TID) | ORAL | 1 refills | Status: DC | PRN

## 2020-05-06 NOTE — Progress Notes (Unsigned)
This visit occurred during the SARS-CoV-2 public health emergency.  Safety protocols were in place, including screening questions prior to the visit, additional usage of staff PPE, and extensive cleaning of exam room while observing appropriate contact time as indicated for disinfecting solutions.  Diabetes:  Using medications without difficulties: Hypoglycemic episodes: none below 100 Hyperglycemic episodes: see below.  Feet problems: altered sensation distally on the feet distally.   Blood Sugars averaging: 170 this AM.  Prev up to 200-300 during gout flare.   eye exam within last year: yes She didn't stick with diet over the holidays as much as usual.   Taking victoza only about 3 times a week.  That would contribute to her sugar elevation.    She can tolerate victoza once she works back to daily dosing. I told the patient I would check with pharmacy about trial of trulicity  She had clear improvement in RLS on requip with worse sx on lower dosing prev. Compliant. She is still putting up with tremor that affects typing.   She was prev on colchicine for gout.  She had sig foot and ankle pain. She is still putting up with knee pain at baseline.    She thought torsemide didn't help as much as lasix. I did not change her meds at this point. I wanted to talk with cardiology first. Discussed.  Meds, vitals, and allergies reviewed.   ROS: Per HPI unless specifically indicated in ROS section   GEN: nad, alert and oriented HEENT: ncat NECK: supple w/o LA CV: rrr. PULM: ctab, no inc wob ABD: soft, +bs EXT: no edema SKIN: no acute rash  Diabetic foot exam: Normal inspection No skin breakdown No calluses  Normal DP pulses Dec sensation to light touch and monofilament Nails normal

## 2020-05-06 NOTE — Patient Instructions (Signed)
Let me see about options in the meantime.  Check to see how much victoza you have at home.   See if you can tolerate it daily.  Take care.  Glad to see you. Plan on recheck in 3 months but we'll be in touch in the meantime.

## 2020-05-08 NOTE — Assessment & Plan Note (Signed)
I talked to her about taking Victoza daily. I will check with pharmacy about trying to simplify her medication regimen and switch her over to Trulicity. In the meantime I do not expect much to get better until we can get her sugar under better control. Discussed. See after visit summary.

## 2020-05-08 NOTE — Assessment & Plan Note (Signed)
She thought torsemide didn't help as much as lasix. I did not change her medications yet. I wanted to talk to cardiology first. I will route this for input. I greatly appreciate the help of all involved.

## 2020-05-08 NOTE — Assessment & Plan Note (Signed)
I would continue Requip as is for now. Discussed. 35 minutes were devoted to patient care in this encounter (this includes time spent reviewing the patient's file/history, interviewing and examining the patient, counseling/reviewing plan with patient).

## 2020-05-09 ENCOUNTER — Telehealth: Payer: Self-pay

## 2020-05-09 NOTE — Telephone Encounter (Signed)
Contacted patient to discuss transition from [place] to Trulicity per PCP request. Patient is currently tolerating Victoza 1.8 mg daily - has been taking full dose daily since recent office visit on 05/06/20. She is open to switching to comparable dose of Trulicity weekly to simplify regimen. Discussed similar side effect profile.  She started a new pen today. She has 4 pens with 10 doses each (40 DS). She will finish out current supply on June 18, 2020. Coordinated a follow up call for June 05, 2020 at 1 PM to discuss switching to Trulicity and request new prescription from PCP.  Debbora Dus, PharmD Clinical Pharmacist [facility] [phone]

## 2020-05-10 ENCOUNTER — Encounter: Payer: Self-pay | Admitting: Family Medicine

## 2020-05-11 ENCOUNTER — Other Ambulatory Visit: Payer: Self-pay | Admitting: Family Medicine

## 2020-05-11 DIAGNOSIS — E119 Type 2 diabetes mellitus without complications: Secondary | ICD-10-CM

## 2020-05-11 MED ORDER — COLCHICINE 0.6 MG PO TABS
0.6000 mg | ORAL_TABLET | Freq: Every day | ORAL | 1 refills | Status: DC | PRN
Start: 2020-05-11 — End: 2020-08-15

## 2020-05-11 NOTE — Telephone Encounter (Signed)
If it is okay with you, I will not send the prescription for Trulicity yet.  I do not want to confuse the situation or have extra items on her med list at this point.  I thank all involved.  Please update me after the call on the 30th.

## 2020-05-13 ENCOUNTER — Telehealth: Payer: Self-pay | Admitting: Physician Assistant

## 2020-05-13 DIAGNOSIS — I10 Essential (primary) hypertension: Secondary | ICD-10-CM

## 2020-05-13 DIAGNOSIS — I5032 Chronic diastolic (congestive) heart failure: Secondary | ICD-10-CM

## 2020-05-13 MED ORDER — FUROSEMIDE 20 MG PO TABS: 60.0000 mg | ORAL_TABLET | Freq: Two times a day (BID) | ORAL | 11 refills | Status: DC

## 2020-05-13 NOTE — Telephone Encounter (Signed)
That's what I had in mind. Thanks!

## 2020-05-13 NOTE — Telephone Encounter (Signed)
Called patient and reviewed advice from [place], [place] with her. She verbalized understanding and agreement to stop torsemide and start furosemide as directed. She has not taken torsemide yet this morning. I advised her to start furosemide today if she is able to get to the pharmacy early today, otherwise make change tomorrow. She requests 1 month supply of furosemide at this time. She is scheduled for lab appointment Tuesday 3/15. She states she weighs daily and agrees to call with increases as advised. She thanked me for the call.

## 2020-05-13 NOTE — Telephone Encounter (Addendum)
Please call the patient. Her PCP reached out to me about changing her Torsemide back to Furosemide. PLAN:  1. DC Torsemide 2. Start Furosemide 60 mg twice daily  3. BMET 1 week. 4. Weigh daily.  If weight increases more than 3 lbs in 1 day, call. 5. If edema or shortness of breath worsens after making changes, call. Richardson Dopp, PA-C    05/13/2020 7:18 AM

## 2020-05-15 ENCOUNTER — Other Ambulatory Visit: Payer: Self-pay | Admitting: Family Medicine

## 2020-05-20 ENCOUNTER — Ambulatory Visit: Payer: Medicare HMO | Admitting: Pulmonary Disease

## 2020-05-20 ENCOUNTER — Other Ambulatory Visit: Payer: Self-pay | Admitting: Pulmonary Disease

## 2020-05-20 ENCOUNTER — Other Ambulatory Visit: Payer: Self-pay

## 2020-05-20 ENCOUNTER — Encounter: Payer: Self-pay | Admitting: Pulmonary Disease

## 2020-05-20 VITALS — BP 140/82 | HR 97 | Temp 97.3°F | Ht 65.0 in | Wt 260.8 lb

## 2020-05-20 DIAGNOSIS — J449 Chronic obstructive pulmonary disease, unspecified: Secondary | ICD-10-CM

## 2020-05-20 DIAGNOSIS — R0600 Dyspnea, unspecified: Secondary | ICD-10-CM | POA: Diagnosis not present

## 2020-05-20 DIAGNOSIS — Z9989 Dependence on other enabling machines and devices: Secondary | ICD-10-CM

## 2020-05-20 DIAGNOSIS — G4733 Obstructive sleep apnea (adult) (pediatric): Secondary | ICD-10-CM | POA: Diagnosis not present

## 2020-05-20 DIAGNOSIS — R0609 Other forms of dyspnea: Secondary | ICD-10-CM

## 2020-05-20 MED ORDER — YUPELRI 175 MCG/3ML IN SOLN: 175.0000 ug | Freq: Every day | RESPIRATORY_TRACT | 3 refills | Status: DC

## 2020-05-20 MED ORDER — FORMOTEROL FUMARATE 20 MCG/2ML IN NEBU: 20.0000 ug | INHALATION_SOLUTION | Freq: Two times a day (BID) | RESPIRATORY_TRACT | 3 refills | Status: DC

## 2020-05-20 NOTE — Patient Instructions (Signed)
Perforomist one vial nebulized in the morning and one vial nebulized in the evening  Pulmicort one vial nebulized in the morning and one vial nebulized in the evening, and rinse your mouth after each use of this  Yupelri one vial nebulized daily  Follow up in 3 months

## 2020-05-20 NOTE — Progress Notes (Signed)
[facility]  Chief Complaint  Patient presents with  . Follow-up    Shortness of breath with activity    Constitutional:  BP 140/82 (BP Location: Right Arm, Cuff Size: Normal)   Pulse 97   Temp (!) 97.3 F (36.3 C) (Temporal)   Ht 5' 5"  (1.651 m)   Wt 260 lb 12.8 oz (118.3 kg)   SpO2 93% Comment: Room air  BMI 43.40 kg/m   Past Medical History:  Ulcerative colitis, Pneumonia, Neuropathy, HTN, HLD, GERD, Diverticulitis, DM, Depression, CKD 3a, Diastolic CHF, OA, Anemia  Past Surgical History:  She  has a past surgical history that includes Abdominal hysterectomy; Cholecystectomy; Cesarean section; Appendectomy; Sigmoidectomy (2010); right heart catheterization (N/A, 02/27/2013); right heart catheterization (N/A, 12/14/2013); Cardiac catheterization; Breast surgery; Hernia repair; Ventral hernia repair (N/A, 11/15/2015); Laparoscopic lysis of adhesions (N/A, 11/15/2015); Insertion of mesh (N/A, 11/15/2015); and Breast biopsy.  Brief Summary:  Kathleen Hatfield is a 65 y.o. female with COPD/asthma and obstructive sleep apnea.      Subjective:   She was in hospital November for COPD exacerbation.  She felt better when using prednisone.  She doesn't feel like she can get enough air in.  She isn't having cough, wheeze, or sputum.  She gets winded with any activity, but especially when she is carrying something.  Not having chest pain or leg swelling.  Uses CPAP nightly.  She has trouble staying asleep.  Will sometimes fall asleep while watching TV.  Tries to wear CPAP when she is taking a nap.  Physical Exam:   Appearance - well kempt   ENMT - no sinus tenderness, no oral exudate, no LAN, Mallampati 3 airway, no stridor  Respiratory - equal breath sounds bilaterally, no wheezing or rales  CV - s1s2 regular rate and rhythm, no murmurs  Ext - no clubbing, no edema  Skin - no rashes  Psych - normal mood and affect   Pulmonary testing:    PFT 07/02/15 >> FEV1 1.50 (56%), FEV1% 69, TLC 4.99 (96%), DLCO 60%  PFT 07/24/19 >> FEV1 1.09 (42%), FEV1% 67, TLC 4.85 (93%), DLCO 87%, +BD  Chest Imaging:   V/Q 04/05/16 >> normal  Sleep Tests:   PSG 09/01/12 >> AHI 36.1  ONO with CPAP 03/14/17>>test time 10 hrs 34 min. Basal SpO2 93%, low SpO2 88%.  CPAP 04/17/20 to 05/16/20 >> used on 28 of 30 nights with average 5 hrs 23 min.  Average AHI 0.3 with CPAP 13 cm H2O  Cardiac Tests:   Echo 05/18/19 >> EF 70 %, mild LVH, grade 1 DD, aortic root 39 mm  Social History:  She  reports that she has never smoked. She has never used smokeless tobacco. She reports that she does not drink alcohol and does not use drugs.  Family History:  Her family history includes COPD in her father; Dementia in her mother; Heart disease in her brother, father, and mother; Hypertension in her father and mother; Other in her brother and brother.     Assessment/Plan:   COPD with asthma. - she has difficulty using inhalers - does better with nebulizer medications - will add yupelri and perforomist - continue pulmicort bid - prn albuterol  Obstructive sleep apnea. - she reports compliance with CPAP and benefit from therapy - uses [facility] for her DME - continue CPAP 13 cm B1Q  Chronic diastolic CHF. - followed by Dr. [name] with [place]  Obesity with deconditioning. -  encouraged her to maintain a regular exercise regimen  Tremor, Restless leg syndrome. - previously seen by Dr. Wells Guiles Tat with [facility]  Time Spent Involved in Patient Care on Day of Examination:  32 minutes  Follow up:  Patient Instructions  Perforomist one vial nebulized in the morning and one vial nebulized in the evening  Pulmicort one vial nebulized in the morning and one vial nebulized in the evening, and rinse your mouth after each use of this  Yupelri one vial nebulized daily  Follow up in 3 months   Medication List:   Allergies  as of 05/20/2020      Reactions   Almond Oil Anaphylaxis, Shortness Of Breath, Swelling   Morphine And Related Shortness Of Breath, Other (See Comments)   Pt. States while in the hospital it affected her breathing, O2 dropped to the 70's   Atorvastatin Other (See Comments)   Leg weakness   Ceclor [cefaclor] Nausea And Vomiting   Ciprofloxacin Other (See Comments)   Makes joints and muscles ache   Levaquin [levofloxacin] Other (See Comments)   Body aches   Losartan Other (See Comments)   Weakness   Statins Other (See Comments)   Leg and body weakness   Sulfa Antibiotics Nausea And Vomiting      Medication List       Accurate as of May 20, 2020  2:42 PM. If you have any questions, ask your nurse or doctor.        STOP taking these medications   budesonide-formoterol 160-4.5 MCG/ACT inhaler Commonly known as: SYMBICORT Stopped by: [name], MD     TAKE these medications   acetaminophen 500 MG tablet Commonly known as: TYLENOL Take 1,000 mg by mouth every 6 (six) hours as needed for pain.   albuterol 108 (90 Base) MCG/ACT inhaler Commonly known as: VENTOLIN HFA Inhale 2 puffs into the lungs every 6 (six) hours as needed.   Bayer Contour Next Test test strip Generic drug: glucose blood 1 each by Other route daily. As directed   budesonide 0.5 MG/2ML nebulizer solution Commonly known as: PULMICORT Take 0.5 mg by nebulization 2 (two) times daily.   buPROPion 300 MG 24 hr tablet Commonly known as: WELLBUTRIN XL TAKE 1 TABLET BY MOUTH DAILY   colchicine 0.6 MG tablet Take 1 tablet (0.6 mg total) by mouth daily as needed (for gout).   dicyclomine 20 MG tablet Commonly known as: BENTYL TAKE 1 TABLET UP TO FOUR TIMES A DAY FOR GI CRAMPING, PAIN, AND NAUSEA/VOMITING AS NEEDED   DULoxetine 60 MG capsule Commonly known as: CYMBALTA TAKE 1 CAPSULE BY MOUTH ONCE DAILY.   formoterol 20 MCG/2ML nebulizer solution Commonly known as: Perforomist Take 2 mLs (20 mcg  total) by nebulization 2 (two) times daily. Started by: [name], MD   furosemide 20 MG tablet Commonly known as: LASIX Take 3 tablets (60 mg total) by mouth 2 (two) times daily.   gabapentin 100 MG capsule Commonly known as: NEURONTIN TAKE 3 CAPSULES BY MOUTH AT BEDTIME.   Global Ease Inject Pen Needles 32G X 4 MM Misc Generic drug: Insulin Pen Needle 1 each by Other route daily.   ipratropium-albuterol 0.5-2.5 (3) MG/3ML Soln Commonly known as: DUONEB Take 3 mLs by nebulization See admin instructions. Nebulize 3 ml's and inhale into the lungs two to three times a day   mesalamine 1.2 g EC tablet Commonly known as: LIALDA Take 1.2 g by mouth daily with breakfast. Take 1.2 grams by mouth  once a day with breakfast- increase as directed   Microlet Lancets Misc 1 each by Other route. As directed   ondansetron 4 MG tablet Commonly known as: Zofran Take 1 tablet (4 mg total) by mouth every 8 (eight) hours as needed for nausea or vomiting.   pantoprazole 40 MG tablet Commonly known as: PROTONIX TAKE 1 TABLET BY MOUTH AT BEDTIME   polyethylene glycol 17 g packet Commonly known as: MIRALAX / GLYCOLAX Take 17 g by mouth daily as needed for mild constipation (MIX AND DRINK AS DIRECTED).   potassium chloride 10 MEQ CR capsule Commonly known as: MICRO-K Take 3 capsules (30 mEq total) by mouth 2 (two) times daily.   propranolol 10 MG tablet Commonly known as: INDERAL TAKE 1 TABLET BY MOUTH 2 TIMES DAILY   rOPINIRole 4 MG tablet Commonly known as: REQUIP TAKE 1 TABLET BY MOUTH TWICE DAILY   spironolactone 25 MG tablet Commonly known as: ALDACTONE TAKE 1 TABLET BY MOUTH DAILY.   traMADol 50 MG tablet Commonly known as: ULTRAM Take 50 mg by mouth daily as needed for pain.   Tyler Aas FlexTouch 200 UNIT/ML FlexTouch Pen Generic drug: insulin degludec Inject 88 Units into the skin daily.   UNABLE TO FIND CPAP- At bedtime   Victoza 18 MG/3ML Sopn Generic drug:  liraglutide INJECT 1.8MG UNDER THE SKIN DAILY   Vitamin D-3 125 MCG (5000 UT) Tabs Take 5,000 Units by mouth every other day.   Yupelri 175 MCG/3ML nebulizer solution Generic drug: revefenacin Take 3 mLs (175 mcg total) by nebulization daily. Started by: [name], MD       Signature:  [name], MD [place] Pager - [phone] 05/20/2020, 2:42 PM

## 2020-05-21 ENCOUNTER — Other Ambulatory Visit: Payer: Medicare HMO

## 2020-05-23 ENCOUNTER — Ambulatory Visit
Admission: EM | Admit: 2020-05-23 | Discharge: 2020-05-23 | Disposition: A | Discharge status: Home / Self Care | Payer: Medicare HMO | Attending: Family Medicine | Admitting: Family Medicine

## 2020-05-23 ENCOUNTER — Telehealth: Payer: Self-pay | Admitting: Family Medicine

## 2020-05-23 ENCOUNTER — Other Ambulatory Visit: Payer: Medicare HMO | Admitting: *Deleted

## 2020-05-23 ENCOUNTER — Other Ambulatory Visit: Payer: Self-pay

## 2020-05-23 DIAGNOSIS — I10 Essential (primary) hypertension: Secondary | ICD-10-CM | POA: Diagnosis not present

## 2020-05-23 DIAGNOSIS — N1831 Chronic kidney disease, stage 3a: Secondary | ICD-10-CM

## 2020-05-23 DIAGNOSIS — R1084 Generalized abdominal pain: Secondary | ICD-10-CM

## 2020-05-23 DIAGNOSIS — I5032 Chronic diastolic (congestive) heart failure: Secondary | ICD-10-CM

## 2020-05-23 DIAGNOSIS — R0602 Shortness of breath: Secondary | ICD-10-CM

## 2020-05-23 DIAGNOSIS — R110 Nausea: Secondary | ICD-10-CM

## 2020-05-23 DIAGNOSIS — Z8719 Personal history of other diseases of the digestive system: Secondary | ICD-10-CM

## 2020-05-23 MED ORDER — BUDESONIDE 0.5 MG/2ML IN SUSP
0.5000 mg | Freq: Two times a day (BID) | RESPIRATORY_TRACT | 3 refills | Status: DC
Start: 2020-05-23 — End: 2022-07-09

## 2020-05-23 MED ORDER — CIPROFLOXACIN HCL 500 MG PO TABS: 500.0000 mg | ORAL_TABLET | Freq: Two times a day (BID) | ORAL | 0 refills | Status: DC

## 2020-05-23 MED ORDER — METRONIDAZOLE 250 MG PO TABS: 250.0000 mg | ORAL_TABLET | Freq: Three times a day (TID) | ORAL | 0 refills | Status: AC

## 2020-05-23 MED ORDER — ONDANSETRON HCL 4 MG PO TABS: 4.0000 mg | ORAL_TABLET | Freq: Four times a day (QID) | ORAL | 0 refills | Status: DC

## 2020-05-23 NOTE — ED Triage Notes (Signed)
See provider note. 

## 2020-05-23 NOTE — ED Provider Notes (Signed)
[place]   [id number] 05/23/20 Arrival Time: 81  CC: ABDOMINAL PAIN  SUBJECTIVE:  Kathleen Hatfield is a 65 y.o. female who presents with abdominal pain. Has hx diverticulitis. Denies a precipitating event, trauma, close contacts with similar symptoms, recent travel or antibiotic use. Reports abdominal cramping. Has not taken OTC medications for this. Denies alleviating or aggravating factors. Denies similar symptoms in the past.     Denies fever, chills, appetite changes, weight changes, nausea, vomiting, chest pain, SOB, diarrhea, constipation, hematochezia, melena, dysuria, difficulty urinating, increased frequency or urgency, flank pain, loss of bowel or bladder function, vaginal discharge, vaginal odor, vaginal bleeding, dyspareunia, pelvic pain.     BP:  110/75 T:  99.1 Oral HR:  75 O2 sat:  92% (hx COPD) RR: 18  No LMP recorded. Patient has had a hysterectomy.  ROS: As per HPI.  All other pertinent ROS negative.     Past Medical History:  Diagnosis Date  . Anemia   . Arthritis   . Asthma   . Chronic diastolic CHF 67/6720   Echocardiogram 05/2019: EF 70, no RWMA, mild LVH, Gr 1 DD, normal RVSF, severe LVH, borderline asc Aorta (39 mm)  . CKD (chronic kidney disease)   . Depression   . Diabetes mellitus without complication ([place])   . Diverticulitis   . Dyspnea    with exertion  . GERD (gastroesophageal reflux disease)   . History of blood transfusion   . Hyperlipidemia    cannot tolerate statins  . Hypertension   . Nuclear stress test    Myoview 05/2019: EF 83, no ischemia or infarction; Low Risk  . Peripheral neuropathy   . Pneumonia   . PONV (postoperative nausea and vomiting)   . RLS (restless legs syndrome)   . Sleep apnea    uses CPAP  . Ulcerative colitis ([place])    dr Collene Mares   Past Surgical History:  Procedure Laterality Date  . ABDOMINAL HYSTERECTOMY    . APPENDECTOMY    . BREAST BIOPSY    . BREAST SURGERY     left biopsy  . CARDIAC  CATHETERIZATION    . CESAREAN SECTION    . CHOLECYSTECTOMY    . HERNIA REPAIR    . INSERTION OF MESH N/A 11/15/2015   Procedure: INSERTION OF MESH;  Surgeon: [name], MD;  Location: [facility];  Service: General;  Laterality: N/A;  . LAPAROSCOPIC LYSIS OF ADHESIONS N/A 11/15/2015   Procedure: LAPAROSCOPIC LYSIS OF ADHESIONS;  Surgeon: [name], MD;  Location: [facility];  Service: General;  Laterality: N/A;  . RIGHT HEART CATHETERIZATION N/A 02/27/2013   Procedure: RIGHT HEART CATH;  Surgeon: Blane Ohara, MD;  Location: [facility] CATH LAB;  Service: Cardiovascular;  Laterality: N/A;  . RIGHT HEART CATHETERIZATION N/A 12/14/2013   Procedure: RIGHT HEART CATH;  Surgeon: Larey Dresser, MD;  Location: [facility] CATH LAB;  Service: Cardiovascular;  Laterality: N/A;  . SIGMOIDECTOMY  2010   diverticular disease  . VENTRAL HERNIA REPAIR N/A 11/15/2015   Procedure: LAPAROSCOPIC VENTRAL WALL HERNIA REPAIR;  Surgeon: [name], MD;  Location: [facility];  Service: General;  Laterality: N/A;   Allergies  Allergen Reactions  . Almond Oil Anaphylaxis, Shortness Of Breath and Swelling  . Morphine And Related Shortness Of Breath and Other (See Comments)    Pt. States while in the hospital it affected her breathing, O2 dropped to the 70's  . Atorvastatin Other (See Comments)    Leg weakness  .  Ceclor [Cefaclor] Nausea And Vomiting  . Ciprofloxacin Other (See Comments)    Makes joints and muscles ache  . Levaquin [Levofloxacin] Other (See Comments)    Body aches  . Losartan Other (See Comments)    Weakness   . Statins Other (See Comments)    Leg and body weakness  . Sulfa Antibiotics Nausea And Vomiting   No current facility-administered medications on file prior to encounter.   Current Outpatient Medications on File Prior to Encounter  Medication Sig Dispense Refill  . formoterol (PERFOROMIST) 20 MCG/2ML nebulizer solution TAKE 2 MLS (20 MCG TOTAL) BY NEBULIZATION 2 (TWO) TIMES DAILY. 360 mL 3  .  acetaminophen (TYLENOL) 500 MG tablet Take 1,000 mg by mouth every 6 (six) hours as needed for pain.    Marland Kitchen albuterol (VENTOLIN HFA) 108 (90 Base) MCG/ACT inhaler Inhale 2 puffs into the lungs every 6 (six) hours as needed. 17 g 2  . BAYER CONTOUR NEXT TEST test strip 1 each by Other route daily. As directed    . budesonide (PULMICORT) 0.5 MG/2ML nebulizer solution Take 2 mLs (0.5 mg total) by nebulization 2 (two) times daily. 360 mL 3  . buPROPion (WELLBUTRIN XL) 300 MG 24 hr tablet TAKE 1 TABLET BY MOUTH DAILY 90 tablet 3  . Cholecalciferol (VITAMIN D-3) 5000 UNITS TABS Take 5,000 Units by mouth every other day.     . colchicine 0.6 MG tablet Take 1 tablet (0.6 mg total) by mouth daily as needed (for gout). 30 tablet 1  . dicyclomine (BENTYL) 20 MG tablet TAKE 1 TABLET UP TO FOUR TIMES A DAY FOR GI CRAMPING, PAIN, AND NAUSEA/VOMITING AS NEEDED 60 tablet 1  . DULoxetine (CYMBALTA) 60 MG capsule TAKE 1 CAPSULE BY MOUTH ONCE DAILY. 90 capsule 1  . furosemide (LASIX) 20 MG tablet Take 3 tablets (60 mg total) by mouth 2 (two) times daily. 180 tablet 11  . gabapentin (NEURONTIN) 100 MG capsule TAKE 3 CAPSULES BY MOUTH AT BEDTIME. 270 capsule 1  . GLOBAL EASE INJECT PEN NEEDLES 32G X 4 MM MISC 1 each by Other route daily.     . insulin degludec (TRESIBA FLEXTOUCH) 200 UNIT/ML FlexTouch Pen Inject 88 Units into the skin daily.    Marland Kitchen ipratropium-albuterol (DUONEB) 0.5-2.5 (3) MG/3ML SOLN Take 3 mLs by nebulization See admin instructions. Nebulize 3 ml's and inhale into the lungs two to three times a day    . mesalamine (LIALDA) 1.2 g EC tablet Take 1.2 g by mouth daily with breakfast. Take 1.2 grams by mouth once a day with breakfast- increase as directed    . MICROLET LANCETS MISC 1 each by Other route. As directed    . pantoprazole (PROTONIX) 40 MG tablet TAKE 1 TABLET BY MOUTH AT BEDTIME 90 tablet 0  . polyethylene glycol (MIRALAX / GLYCOLAX) packet Take 17 g by mouth daily as needed for mild constipation  (MIX AND DRINK AS DIRECTED).     Marland Kitchen potassium chloride (MICRO-K) 10 MEQ CR capsule Take 3 capsules (30 mEq total) by mouth 2 (two) times daily. 180 capsule 3  . propranolol (INDERAL) 10 MG tablet TAKE 1 TABLET BY MOUTH 2 TIMES DAILY 180 tablet 1  . revefenacin (YUPELRI) 175 MCG/3ML nebulizer solution Take 3 mLs (175 mcg total) by nebulization daily. 90 mL 3  . rOPINIRole (REQUIP) 4 MG tablet TAKE 1 TABLET BY MOUTH TWICE DAILY 180 tablet 1  . spironolactone (ALDACTONE) 25 MG tablet TAKE 1 TABLET BY MOUTH DAILY. 30 tablet 1  .  traMADol (ULTRAM) 50 MG tablet Take 50 mg by mouth daily as needed for pain.    Marland Kitchen UNABLE TO FIND CPAP- At bedtime    . VICTOZA 18 MG/3ML SOPN INJECT 1.8MG UNDER THE SKIN DAILY 9 mL 1   Social History   Socioeconomic History  . Marital status: Divorced    Spouse name: Not on file  . Number of children: Not on file  . Years of education: Not on file  . Highest education level: Not on file  Occupational History  . Occupation: unemployed  Tobacco Use  . Smoking status: Never Smoker  . Smokeless tobacco: Never Used  Vaping Use  . Vaping Use: Never used  Substance and Sexual Activity  . Alcohol use: No  . Drug use: No  . Sexual activity: Not on file  Other Topics Concern  . Not on file  Social History Narrative   Live with spouse   Right handed   Social Determinants of Health   Financial Resource Strain: Not on file  Food Insecurity: Not on file  Transportation Needs: Not on file  Physical Activity: Not on file  Stress: Not on file  Social Connections: Not on file  Intimate Partner Violence: Not on file   Family History  Problem Relation Age of Onset  . Heart disease Mother   . Hypertension Mother   . Dementia Mother   . Heart disease Father   . COPD Father   . Hypertension Father   . Heart disease Brother   . Other Brother        knee replacement; hip replacement  . Other Brother        cant brathe when laying down  . Breast cancer Neg Hx   .  Colon cancer Neg Hx      OBJECTIVE:  There were no vitals filed for this visit.  General appearance: Alert; NAD HEENT: NCAT.  Oropharynx clear.  Lungs: clear to auscultation bilaterally without adventitious breath sounds Heart: regular rate and rhythm.  Radial pulses 2+ symmetrical bilaterally Abdomen: soft, non-distended; normal active bowel sounds; non-tender to light and deep palpation; nontender at McBurney's point; negative Murphy's sign; negative rebound; no guarding Back: no CVA tenderness Extremities: no edema; symmetrical with no gross deformities Skin: warm and dry Neurologic: normal gait Psychological: alert and cooperative; normal mood and affect  LABS: No results found for this or any previous visit (from the past 24 hour(s)).  DIAGNOSTIC STUDIES: No results found.   ASSESSMENT & PLAN:  1. Generalized abdominal pain   2. Nausea without vomiting   3. History of diverticulitis     Meds ordered this encounter  Medications  . ondansetron (ZOFRAN) 4 MG tablet    Sig: Take 1 tablet (4 mg total) by mouth every 6 (six) hours.    Dispense:  12 tablet    Refill:  0    Order Specific Question:   Supervising Provider    Answer:   Chase Picket [id number]  . ciprofloxacin (CIPRO) 500 MG tablet    Sig: Take 1 tablet (500 mg total) by mouth 2 (two) times daily.    Dispense:  14 tablet    Refill:  0    Order Specific Question:   Supervising Provider    Answer:   Chase Picket [id number]  . metroNIDAZOLE (FLAGYL) 250 MG tablet    Sig: Take 1 tablet (250 mg total) by mouth 3 (three) times daily for 7 days.    Dispense:  21  tablet    Refill:  0    Order Specific Question:   Supervising Provider    Answer:   Chase Picket [id number]    Prescribed zofran Prescribed Cipro Prescribed flagyl Get rest and drink plenty of fluids    DIET Instructions:  30 minutes after taking nausea medicine, begin with sips of clear liquids. If able to hold down 2 - 4 ounces for  30 minutes, begin drinking more. Increase your fluid intake to replace losses. Clear liquids only for 24 hours (water, tea, sport drinks, clear flat ginger ale or cola and juices, broth, jello, popsicles, ect). Advance to bland foods, applesauce, rice, baked or boiled chicken, ect. Avoid milk, greasy foods and anything that doesn't agree with you.  If you experience new or worsening symptoms return or go to ER such as fever, chills, nausea, vomiting, diarrhea, bloody or dark tarry stools, constipation, urinary symptoms, worsening abdominal discomfort, symptoms that do not improve with medications, inability to keep fluids down.  Reviewed expectations re: course of current medical issues. Questions answered. Outlined signs and symptoms indicating need for more acute intervention. Patient verbalized understanding. After Visit Summary given.   Faustino Congress, NP 05/23/20 1931

## 2020-05-23 NOTE — Telephone Encounter (Signed)
Changes Requested   Name from pharmacy: FORMOTEROL 20 MCG/2 ML NEB VL       Will file in chart as: formoterol (PERFOROMIST) 20 MCG/2ML nebulizer solution   Sig: TAKE 2 MLS (20 MCG TOTAL) BY NEBULIZATION 2 (TWO) TIMES DAILY.   Disp:  360 mL  Refills:  3   Start: 05/20/2020   Class: Normal   Non-formulary   Last ordered: 3 days ago by [name], MD Last refill: 05/20/2020   Rx #: [id number]   Pharmacy comment: Alternative Requested:PA REQUIRED MED IS NOT COVERED.     To [facility] filled at: [id number] [place] [place]    [facility] [place] [facility] [facility] could try to send Rx for pt's budesonide sol through DME. Called pt but she was not avail. Spoke with DSharlet Salinawho is EC for pt letting him know what was happening with her neb sol. Stated to him that I was going to try to see if we could [place] the [place] [facility] and stated to him if we ran into any issues with [place] [facility] we would then go through local pharmacy again and would then do PA if needed. DSharlet Salinaverbalized understanding and stated that he would update pt about the call. [facility] Nothing further needed.

## 2020-05-23 NOTE — Progress Notes (Signed)
  Chronic Care Management   Outreach Note  05/23/2020 Name: YU CRAGUN MRN: [id number] DOB: 1955-11-15  Referred by: [name], MD Reason for referral : No chief complaint on file.   An unsuccessful telephone outreach was attempted today. The patient was referred to the pharmacist for assistance with care management and care coordination.   Follow Up Plan:   Lauretta Grill Upstream Scheduler

## 2020-05-23 NOTE — Discharge Instructions (Signed)
I have sent in Cipro for you to take twice a day for 7 days  I have sent in metronidazole for you to take 3 times a day for 7 days  I have sent in Zofran for you to take one tablet every 8 hours as needed for nausea.  Follow up with this office or with primary care if symptoms are persisting.  Follow up in the ER for high fever, trouble swallowing, trouble breathing, other concerning symptoms.

## 2020-05-24 ENCOUNTER — Other Ambulatory Visit: Payer: Self-pay | Admitting: *Deleted

## 2020-05-24 LAB — BASIC METABOLIC PANEL
BUN/Creatinine Ratio: 21 (ref 12–28)
BUN: 23 mg/dL (ref 8–27)
CO2: 29 mmol/L (ref 20–29)
Calcium: 9.4 mg/dL (ref 8.7–10.3)
Chloride: 94 mmol/L — ABNORMAL LOW (ref 96–106)
Creatinine, Ser: 1.08 mg/dL — ABNORMAL HIGH (ref 0.57–1.00)
Glucose: 174 mg/dL — ABNORMAL HIGH (ref 65–99)
Potassium: 3.2 mmol/L — ABNORMAL LOW (ref 3.5–5.2)
Sodium: 143 mmol/L (ref 134–144)
eGFR: 57 mL/min/{1.73_m2} — ABNORMAL LOW (ref >59)

## 2020-05-24 MED ORDER — POTASSIUM CHLORIDE ER 10 MEQ PO CPCR
50.0000 meq | ORAL_CAPSULE | Freq: Two times a day (BID) | ORAL | 0 refills | Status: DC
Start: 2020-05-24 — End: 2020-06-12

## 2020-05-27 ENCOUNTER — Telehealth: Payer: Self-pay | Admitting: Family Medicine

## 2020-05-27 NOTE — Progress Notes (Signed)
  Chronic Care Management   Note  05/27/2020 Name: JATZIRI GOFFREDO MRN: [id number] DOB: February 05, 1956  MALINI FLEMINGS is a 65 y.o. year old female who is a primary care patient of [name], MD. I reached out to Eveline Keto by phone today in response to a referral sent by Ms. Mikey Kirschner Thurgood's PCP, [name], MD.   Ms. Orne was given information about Chronic Care Management services today including:  1. CCM service includes personalized support from designated clinical staff supervised by her physician, including individualized plan of care and coordination with other care providers 2. 24/7 contact phone numbers for assistance for urgent and routine care needs. 3. Service will only be billed when office clinical staff spend 20 minutes or more in a month to coordinate care. 4. Only one practitioner may furnish and bill the service in a calendar month. 5. The patient may stop CCM services at any time (effective at the end of the month) by phone call to the office staff.   Patient agreed to services and verbal consent obtained.   Follow up plan:   Lauretta Grill Upstream Scheduler

## 2020-05-29 DIAGNOSIS — G4733 Obstructive sleep apnea (adult) (pediatric): Secondary | ICD-10-CM | POA: Diagnosis not present

## 2020-05-30 ENCOUNTER — Encounter: Payer: Self-pay | Admitting: Cardiovascular Disease

## 2020-05-30 ENCOUNTER — Ambulatory Visit: Payer: Medicare HMO | Admitting: Cardiovascular Disease

## 2020-05-30 ENCOUNTER — Other Ambulatory Visit: Payer: Self-pay

## 2020-05-30 VITALS — BP 120/80 | HR 88 | Ht 65.0 in | Wt 263.6 lb

## 2020-05-30 DIAGNOSIS — I5032 Chronic diastolic (congestive) heart failure: Secondary | ICD-10-CM | POA: Diagnosis not present

## 2020-05-30 DIAGNOSIS — E876 Hypokalemia: Secondary | ICD-10-CM

## 2020-05-30 DIAGNOSIS — I10 Essential (primary) hypertension: Secondary | ICD-10-CM

## 2020-05-30 NOTE — Patient Instructions (Signed)
Medication Instructions:  Your provider recommends that you continue on your current medications as directed. Please refer to the Current Medication list given to you today.   *If you need a refill on your cardiac medications before your next appointment, please call your pharmacy*  Lab Work: TODAY! BMET If you have labs (blood work) drawn today and your tests are completely normal, you will receive your results only by: Marland Kitchen MyChart Message (if you have MyChart) OR . A paper copy in the mail If you have any lab test that is abnormal or we need to change your treatment, we will call you to review the results.  Follow-Up: At [facility], you and your health needs are our priority.  As part of our continuing mission to provide you with exceptional heart care, we have created designated Provider Care Teams.  These Care Teams include your primary Cardiologist (physician) and Advanced Practice Providers (APPs -  Physician Assistants and Nurse Practitioners) who all work together to provide you with the care you need, when you need it. Your next appointment:   6 month(s) The format for your next appointment:   In Person Provider:   Richardson Dopp, PA-C

## 2020-05-30 NOTE — Progress Notes (Signed)
Cardiology Office Note:    Date:  05/30/2020   ID:  Kathleen Hatfield, DOB 1955-12-08, MRN [id number]  PCP:  [name], MD   [place]  Cardiologist:  [name], MD  Advanced Practice Provider:  No care team member to display Electrophysiologist:  None       Referring MD: [name], MD   Chief Complaint  Patient presents with  . Shortness of Breath    History of Present Illness:    Kathleen Hatfield is a 65 y.o. female with a hx of:  Chronic diastolic CHF ? Echocardiogram 05/2019: EF 70, mild LVH, Gr 1 DD, severe RVH ? 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 Intolerant of statins  GERD  Diverticulitis s/p partial colectomy  The patient reports no significant change in her chronic shortness of breath.  She is getting along okay and is figured out that she has problems with chest and abdominal discomfort when her potassium gets low.  Her breathing is stabilized and she has had no recent problems with abdominal or leg swelling.  No orthopnea or PND.  She does have intermittent problems with asthma.  Recent potassium was 3.2 in the supplementation was increased.  She is due for updated lab work today.  No recent chest pain or pressure.  No lightheadedness or syncope.    Past Medical History:  Diagnosis Date  . Anemia   . Arthritis   . Asthma   . Chronic diastolic CHF 40/9811   Echocardiogram 05/2019: EF 70, no RWMA, mild LVH, Gr 1 DD, normal RVSF, severe LVH, borderline asc Aorta (39 mm)  . CKD (chronic kidney disease)   . Depression   . Diabetes mellitus without complication ([name])   . Diverticulitis   . Dyspnea    with exertion  . GERD (gastroesophageal reflux disease)   .  History of blood transfusion   . Hyperlipidemia    cannot tolerate statins  . Hypertension   . Nuclear stress test    Myoview 05/2019: EF 83, no ischemia or infarction; Low Risk  . Peripheral neuropathy   . Pneumonia   . PONV (postoperative nausea and vomiting)   . RLS (restless legs syndrome)   . Sleep apnea    uses CPAP  . Ulcerative colitis ([place])    dr Collene Mares    Past Surgical History:  Procedure Laterality Date  . ABDOMINAL HYSTERECTOMY    . APPENDECTOMY    . BREAST BIOPSY    . BREAST SURGERY     left biopsy  . CARDIAC CATHETERIZATION    . CESAREAN SECTION    . CHOLECYSTECTOMY    . HERNIA REPAIR    . INSERTION OF MESH N/A 11/15/2015   Procedure: INSERTION OF MESH;  Surgeon: Chandel Zaun Boston, MD;  Location: [facility];  Service: General;  Laterality: N/A;  . LAPAROSCOPIC LYSIS OF ADHESIONS N/A 11/15/2015   Procedure: LAPAROSCOPIC LYSIS OF ADHESIONS;  Surgeon: Shaia Porath [place], MD;  Location: [facility];  Service: General;  Laterality: N/A;  . RIGHT HEART CATHETERIZATION N/A 02/27/2013   Procedure: RIGHT HEART CATH;  Surgeon: Blane Ohara, MD;  Location: [facility] CATH LAB;  Service: Cardiovascular;  Laterality: N/A;  . RIGHT HEART CATHETERIZATION N/A 12/14/2013  Procedure: RIGHT HEART CATH;  Surgeon: Larey Dresser, MD;  Location: [facility] CATH LAB;  Service: Cardiovascular;  Laterality: N/A;  . SIGMOIDECTOMY  2010   diverticular disease  . VENTRAL HERNIA REPAIR N/A 11/15/2015   Procedure: LAPAROSCOPIC VENTRAL WALL HERNIA REPAIR;  Surgeon:  Boston, MD;  Location: [facility];  Service: General;  Laterality: N/A;    Current Medications: Current Meds  Medication Sig  . acetaminophen (TYLENOL) 500 MG tablet Take 1,000 mg by mouth every 6 (six) hours as needed for pain.  Marland Kitchen albuterol (VENTOLIN HFA) 108 (90 Base) MCG/ACT inhaler Inhale 2 puffs into the lungs every 6 (six) hours as needed.  Marland Kitchen BAYER CONTOUR NEXT TEST test strip 1 each by Other route daily. As directed  . budesonide (PULMICORT) 0.5 MG/2ML  nebulizer solution Take 2 mLs (0.5 mg total) by nebulization 2 (two) times daily.  Marland Kitchen buPROPion (WELLBUTRIN XL) 300 MG 24 hr tablet TAKE 1 TABLET BY MOUTH DAILY  . Cholecalciferol (VITAMIN D-3) 5000 UNITS TABS Take 5,000 Units by mouth every other day.   . ciprofloxacin (CIPRO) 500 MG tablet Take 1 tablet (500 mg total) by mouth 2 (two) times daily.  . colchicine 0.6 MG tablet Take 1 tablet (0.6 mg total) by mouth daily as needed (for gout).  Marland Kitchen dicyclomine (BENTYL) 20 MG tablet TAKE 1 TABLET UP TO FOUR TIMES A DAY FOR GI CRAMPING, PAIN, AND NAUSEA/VOMITING AS NEEDED  . DULoxetine (CYMBALTA) 60 MG capsule TAKE 1 CAPSULE BY MOUTH ONCE DAILY.  . formoterol (PERFOROMIST) 20 MCG/2ML nebulizer solution TAKE 2 MLS (20 MCG TOTAL) BY NEBULIZATION 2 (TWO) TIMES DAILY.  . furosemide (LASIX) 20 MG tablet Take 3 tablets (60 mg total) by mouth 2 (two) times daily.  Marland Kitchen gabapentin (NEURONTIN) 100 MG capsule TAKE 3 CAPSULES BY MOUTH AT BEDTIME.  Marland Kitchen GLOBAL EASE INJECT PEN NEEDLES 32G X 4 MM MISC 1 each by Other route daily.   . insulin degludec (TRESIBA FLEXTOUCH) 200 UNIT/ML FlexTouch Pen Inject 88 Units into the skin daily.  Marland Kitchen ipratropium-albuterol (DUONEB) 0.5-2.5 (3) MG/3ML SOLN Take 3 mLs by nebulization See admin instructions. Nebulize 3 ml's and inhale into the lungs two to three times a day  . mesalamine (LIALDA) 1.2 g EC tablet Take 1.2 g by mouth daily with breakfast. Take 1.2 grams by mouth once a day with breakfast- increase as directed  . metolazone (ZAROXOLYN) 5 MG tablet Take 5 mg by mouth as needed.  . metroNIDAZOLE (FLAGYL) 250 MG tablet Take 1 tablet (250 mg total) by mouth 3 (three) times daily for 7 days.  Marland Kitchen MICROLET LANCETS MISC 1 each by Other route. As directed  . ondansetron (ZOFRAN) 4 MG tablet Take 1 tablet (4 mg total) by mouth every 6 (six) hours.  . pantoprazole (PROTONIX) 40 MG tablet TAKE 1 TABLET BY MOUTH AT BEDTIME  . polyethylene glycol (MIRALAX / GLYCOLAX) packet Take 17 g by mouth  daily as needed for mild constipation (MIX AND DRINK AS DIRECTED).   Marland Kitchen potassium chloride (MICRO-K) 10 MEQ CR capsule Take 5 capsules (50 mEq total) by mouth 2 (two) times daily.  . propranolol (INDERAL) 10 MG tablet TAKE 1 TABLET BY MOUTH 2 TIMES DAILY  . revefenacin (YUPELRI) 175 MCG/3ML nebulizer solution Take 3 mLs (175 mcg total) by nebulization daily.  Marland Kitchen rOPINIRole (REQUIP) 4 MG tablet TAKE 1 TABLET BY MOUTH TWICE DAILY  . spironolactone (ALDACTONE) 25 MG tablet TAKE 1 TABLET BY MOUTH DAILY.  . traMADol (ULTRAM) 50 MG tablet  Take 50 mg by mouth daily as needed for pain.  Marland Kitchen UNABLE TO FIND CPAP- At bedtime  . VICTOZA 18 MG/3ML SOPN INJECT 1.8MG UNDER THE SKIN DAILY     Allergies:   Almond oil, Morphine and related, Atorvastatin, Ceclor [cefaclor], Ciprofloxacin, Levaquin [levofloxacin], Losartan, Statins, and Sulfa antibiotics   Social History   Socioeconomic History  . Marital status: Divorced    Spouse name: Not on file  . Number of children: Not on file  . Years of education: Not on file  . Highest education level: Not on file  Occupational History  . Occupation: unemployed  Tobacco Use  . Smoking status: Never Smoker  . Smokeless tobacco: Never Used  Vaping Use  . Vaping Use: Never used  Substance and Sexual Activity  . Alcohol use: No  . Drug use: No  . Sexual activity: Not on file  Other Topics Concern  . Not on file  Social History Narrative   Live with spouse   Right handed   Social Determinants of Health   Financial Resource Strain: Not on file  Food Insecurity: Not on file  Transportation Needs: Not on file  Physical Activity: Not on file  Stress: Not on file  Social Connections: Not on file     Family History: The patient's family history includes COPD in her father; Dementia in her mother; Heart disease in her brother, father, and mother; Hypertension in her father and mother; Other in her brother and brother. There is no history of Breast cancer or  Colon cancer.  ROS:   Please see the history of present illness.    All other systems reviewed and are negative.  EKGs/Labs/Other Studies Reviewed:    The following studies were reviewed today: 05/18/2019: 1. Left ventricular ejection fraction, by estimation, is 70%. The left  ventricle has hyperdynamic function. The left ventricle has no regional  wall motion abnormalities. There is mild left ventricular hypertrophy.  Left ventricular diastolic parameters  are consistent with Grade I diastolic dysfunction (impaired relaxation).  2. Right ventricular systolic function is normal. The right ventricular  size is normal. severely increased right ventricular wall thickness.  Tricuspid regurgitation signal is inadequate for assessing PA pressure.  3. The mitral valve is normal in structure. No evidence of mitral valve  regurgitation. No evidence of mitral stenosis.  4. The aortic valve was not well visualized. Aortic valve regurgitation  is not visualized. No aortic stenosis is present.  5. Aortic dilatation noted. There is borderline dilatation of the  ascending aorta measuring 39 mm.  6. The inferior vena cava is normal in size with greater than 50%  respiratory variability, suggesting right atrial pressure of 3 mmHg.  7. Increased flow velocities may be secondary to anemia, thyrotoxicosis,  hyperdynamic ventricles, or high flow state.    EKG:  EKG is not ordered today.    Recent Labs: 06/09/2019: TSH 2.850 06/22/2019: ALT 16 01/08/2020: B Natriuretic Peptide 58.9 01/10/2020: Magnesium 1.8 01/15/2020: Pro B Natriuretic peptide (BNP) 51.0 01/22/2020: Hemoglobin 13.1; Platelets 325.0 05/23/2020: BUN 23; Creatinine, Ser 1.08; Potassium 3.2; Sodium 143  Recent Lipid Panel    Component Value Date/Time   CHOL 333 04/26/2019 0000   TRIG 469 (A) 04/26/2019 0000   HDL 34 (L) 03/25/2013 0500   CHOLHDL 6.9 03/25/2013 0500   VLDL 69 (H) 03/25/2013 0500   LDLCALC 133 (H) 03/25/2013 0500      Risk Assessment/Calculations:       Physical Exam:    VS:  BP 120/80   Pulse 88   Ht 5' 5"  (1.651 m)   Wt 263 lb 9.6 oz (119.6 kg)   SpO2 94%   BMI 43.87 kg/m     Wt Readings from Last 3 Encounters:  05/30/20 263 lb 9.6 oz (119.6 kg)  05/20/20 260 lb 12.8 oz (118.3 kg)  05/06/20 258 lb (117 kg)     GEN:  Well nourished, well developed, pleasant obese woman in no acute distress HEENT: Normal NECK: No JVD; No carotid bruits LYMPHATICS: No lymphadenopathy CARDIAC: RRR, no murmurs, rubs, gallops RESPIRATORY:  Clear to auscultation without rales, wheezing or rhonchi  ABDOMEN: Soft, non-tender, non-distended MUSCULOSKELETAL:  No edema; No deformity  SKIN: Warm and dry NEUROLOGIC:  Alert and oriented x 3 PSYCHIATRIC:  Normal affect   ASSESSMENT:    1. Essential hypertension   2. Chronic diastolic heart failure ([place])   3. Hypokalemia    PLAN:    In order of problems listed above:  1. Blood pressure controlled.  Patient on chronic diuretic therapy, see below.  Treated with spironolactone, low-dose propranolol, furosemide. 2. Volume status looks good today.  She is treated with a combination of spironolactone and furosemide.  We will check a metabolic panel to reassess electrolytes with recent hypokalemia noted.  We discussed the specifics of her congestive heart failure today.  I do not think she has problems with LV systolic or diastolic dysfunction.  She is noted to have RVH and primarily suffers from right heart failure.  Past assessment with right heart catheterization, most recently in 2015, have been unrevealing with essentially normal right heart pressures.        Medication Adjustments/Labs and Tests Ordered: Current medicines are reviewed at length with the patient today.  Concerns regarding medicines are outlined above.  Orders Placed This Encounter  Procedures  . Basic metabolic panel   No orders of the defined types were placed in this  encounter.   Patient Instructions  Medication Instructions:  Your provider recommends that you continue on your current medications as directed. Please refer to the Current Medication list given to you today.   *If you need a refill on your cardiac medications before your next appointment, please call your pharmacy*  Lab Work: TODAY! BMET If you have labs (blood work) drawn today and your tests are completely normal, you will receive your results only by: Marland Kitchen MyChart Message (if you have MyChart) OR . A paper copy in the mail If you have any lab test that is abnormal or we need to change your treatment, we will call you to review the results.  Follow-Up: At [facility], you and your health needs are our priority.  As part of our continuing mission to provide you with exceptional heart care, we have created designated Provider Care Teams.  These Care Teams include your primary Cardiologist (physician) and Advanced Practice Providers (APPs -  Physician Assistants and Nurse Practitioners) who all work together to provide you with the care you need, when you need it. Your next appointment:   6 month(s) The format for your next appointment:   In Person Provider:   Richardson Dopp, PA-C     Signed, [name], MD  05/30/2020 3:41 PM    [place]

## 2020-05-31 LAB — BASIC METABOLIC PANEL
BUN/Creatinine Ratio: 29 — ABNORMAL HIGH (ref 12–28)
BUN: 36 mg/dL — ABNORMAL HIGH (ref 8–27)
CO2: 27 mmol/L (ref 20–29)
Calcium: 10.1 mg/dL (ref 8.7–10.3)
Chloride: 89 mmol/L — ABNORMAL LOW (ref 96–106)
Creatinine, Ser: 1.23 mg/dL — ABNORMAL HIGH (ref 0.57–1.00)
Glucose: 192 mg/dL — ABNORMAL HIGH (ref 65–99)
Potassium: 3.9 mmol/L (ref 3.5–5.2)
Sodium: 138 mmol/L (ref 134–144)
eGFR: 49 mL/min/{1.73_m2} — ABNORMAL LOW (ref >59)

## 2020-06-03 DIAGNOSIS — J449 Chronic obstructive pulmonary disease, unspecified: Secondary | ICD-10-CM | POA: Diagnosis not present

## 2020-06-03 NOTE — Telephone Encounter (Signed)
Can we check if [facility] ran this through medicare part be for yupelri?  Other option would be to switch to different pharmacy provider - could try direct Rx.

## 2020-06-03 NOTE — Telephone Encounter (Signed)
Called [employer] and spoke with Heather. Heather stated Kathleen Hatfield was ran through Medicare Part B and co pay is $207. Called [facility] and Kathleen Hatfield was ran through Medicare Part B. Patient cost would be $1356.99. Good Rx shows lowest cost [phone] and Script Save $1158.00.  Message routed to Dr. Halford Chessman

## 2020-06-03 NOTE — Telephone Encounter (Signed)
Patient sent a message stating she had not received Yupelri or Pulmicort from pharmacy. Pulmicort was sent to [facility]. I have talked to [facility] and they are working on getting Pulmicort to the Patient. [name] was sent to [facility] and is not covered by insurance. I checked on cost through [place] and it would be $207/month. Patient has been updated. I did not know if you wanted to try another medication in place of Yupelri?  Message routed to Dr. Halford Chessman

## 2020-06-04 NOTE — Telephone Encounter (Signed)
Spoke with [name] representative, Rodman Key.  Discussed Yupelri cost, insurance, and alternatives. Direct Rx referral completed, with insurance information, and LOV note. Will hold for Dr. Halford Chessman to sign.

## 2020-06-05 ENCOUNTER — Ambulatory Visit (INDEPENDENT_AMBULATORY_CARE_PROVIDER_SITE_OTHER): Payer: Medicare HMO

## 2020-06-05 ENCOUNTER — Other Ambulatory Visit: Payer: Self-pay

## 2020-06-05 ENCOUNTER — Telehealth: Payer: Self-pay

## 2020-06-05 DIAGNOSIS — E119 Type 2 diabetes mellitus without complications: Secondary | ICD-10-CM | POA: Diagnosis not present

## 2020-06-05 MED ORDER — TRULICITY 1.5 MG/0.5ML ~~LOC~~ SOAJ: 1.5000 mg | SUBCUTANEOUS | 5 refills | Status: DC

## 2020-06-05 NOTE — Progress Notes (Addendum)
Chronic Care Management Pharmacy Note  06/05/2020 Name:  Kathleen Hatfield MRN:  [id number] DOB:  Nov 02, 1955  Subjective: Kathleen Hatfield is an 65 y.o. year old female who is a primary patient of [name], [name], MD.  The CCM team was consulted for assistance with disease management and care coordination needs.    Engaged with patient by telephone for Trulicity conversion in response to provider referral for pharmacy case management and/or care coordination services. Initial CCM visit scheduled for May 2022.  Consent to Services:  The patient was given information about Chronic Care Management services, agreed to services, and gave verbal consent prior to initiation of services.  Please see initial visit note for detailed documentation.   Patient Care Team: [name], MD as PCP - General (Family Medicine) [name], MD as PCP - Cardiology (Cardiology) [name], MD as Consulting Physician (Pulmonary Disease) [name], MD as Consulting Physician (General Surgery) Yoakum, [name], MD as Consulting Physician (Pulmonary Disease) Debbora Dus, [facility] as Pharmacist (Pharmacist)  Objective:  Lab Results  Component Value Date/Time   HGBA1C 11.5 (A) 05/06/2020 02:38 PM   HGBA1C 9.2 (H) 01/09/2020 05:30 AM   HGBA1C 8.7 (H) 06/22/2019 01:59 PM   GFR 45.83 (L) 01/22/2020 01:49 PM   GFR 37.06 (L) 01/15/2020 02:47 PM   CCM Care Plan  Allergies  Allergen Reactions  . Almond Oil Anaphylaxis, Shortness Of Breath and Swelling  . Morphine And Related Shortness Of Breath and Other (See Comments)    Pt. States while in the hospital it affected her breathing, O2 dropped to the 70's  . Atorvastatin Other (See Comments)    Leg weakness  . Ceclor [Cefaclor] Nausea And Vomiting  . Ciprofloxacin Other (See Comments)    Makes joints and muscles ache  . Levaquin [Levofloxacin] Other (See Comments)    Body aches  . Losartan Other (See Comments)    Weakness   . Statins  Other (See Comments)    Leg and body weakness  . Sulfa Antibiotics Nausea And Vomiting    Medications Reviewed Today    Reviewed by [name], MD (Physician) on 05/30/20 at [place] List Status: <None>  Medication Order Taking? Sig Documenting Provider Last Dose Status Informant  acetaminophen (TYLENOL) 500 MG tablet [id number] Yes Take 1,000 mg by mouth every 6 (six) hours as needed for pain. [provider] Taking Active Self  albuterol (VENTOLIN HFA) 108 (90 Base) MCG/ACT inhaler [id number] Yes Inhale 2 puffs into the lungs every 6 (six) hours as needed. [name], MD Taking Active   BAYER CONTOUR NEXT TEST test strip [id number] Yes 1 each by Other route daily. As directed [provider] Taking Active Self           Med Note Tamala Julian, JEFFREY W   Mon May 03, 2017 11:02 PM)    budesonide (PULMICORT) 0.5 MG/2ML nebulizer solution [id number] Yes Take 2 mLs (0.5 mg total) by nebulization 2 (two) times daily. [name], MD Taking Active   buPROPion (WELLBUTRIN XL) 300 MG 24 hr tablet [id number] Yes TAKE 1 TABLET BY MOUTH DAILY [name], MD Taking Active Self  Cholecalciferol (VITAMIN D-3) 5000 UNITS TABS [id number] Yes Take 5,000 Units by mouth every other day.  [provider] Taking Active Self  ciprofloxacin (CIPRO) 500 MG tablet [id number] Yes Take 1 tablet (500 mg total) by mouth 2 (two) times daily. Faustino Congress, NP Taking Active   colchicine 0.6 MG  tablet [id number] Yes Take 1 tablet (0.6 mg total) by mouth daily as needed (for gout). [name], MD Taking Active   dicyclomine (BENTYL) 20 MG tablet [id number] Yes TAKE 1 TABLET UP TO FOUR TIMES A DAY FOR GI CRAMPING, PAIN, AND NAUSEA/VOMITING AS NEEDED [name], MD Taking Active Self  DULoxetine (CYMBALTA) 60 MG capsule [id number] Yes TAKE 1 CAPSULE BY MOUTH ONCE DAILY. [name], MD Taking Active   formoterol (PERFOROMIST) 20 MCG/2ML nebulizer solution [id number] Yes TAKE 2  MLS (20 MCG TOTAL) BY NEBULIZATION 2 (TWO) TIMES DAILY. [name], MD Taking Active   furosemide (LASIX) 20 MG tablet [id number] Yes Take 3 tablets (60 mg total) by mouth 2 (two) times daily. Richardson Dopp T, PA-C Taking Active   gabapentin (NEURONTIN) 100 MG capsule [id number] Yes TAKE 3 CAPSULES BY MOUTH AT BEDTIME. [name], MD Taking Active   GLOBAL EASE INJECT PEN NEEDLES 32G X 4 MM MISC [id number] Yes 1 each by Other route daily.  [provider] Taking Active Self           Med Note Duffy Bruce, Ardelia Mems Oct 21, 2015  3:13 PM)    insulin degludec (TRESIBA FLEXTOUCH) 200 UNIT/ML FlexTouch Pen [id number] Yes Inject 88 Units into the skin daily. [name], MD Taking Active Self           Med Note Earna Coder Jan 15, 2020  2:14 PM)    ipratropium-albuterol (DUONEB) 0.5-2.5 (3) MG/3ML Bailey Mech [id number] Yes Take 3 mLs by nebulization See admin instructions. Nebulize 3 ml's and inhale into the lungs two to three times a day [provider] Taking Active Self  mesalamine (LIALDA) 1.2 g EC tablet [id number] Yes Take 1.2 g by mouth daily with breakfast. Take 1.2 grams by mouth once a day with breakfast- increase as directed [provider] Taking Active Self  metolazone (ZAROXOLYN) 5 MG tablet [id number] Yes Take 5 mg by mouth as needed. [provider] Taking Active   metroNIDAZOLE (FLAGYL) 250 MG tablet [id number] Yes Take 1 tablet (250 mg total) by mouth 3 (three) times daily for 7 days. Faustino Congress, NP Taking Active   MICROLET LANCETS [place] [id number] Yes 1 each by Other route. As directed [provider] Taking Active Self           Med Note Duffy Bruce, Ardelia Mems Oct 21, 2015  3:31 PM)    ondansetron (ZOFRAN) 4 MG tablet [id number] Yes Take 1 tablet (4 mg total) by mouth every 6 (six) hours. Faustino Congress, NP Taking Active   pantoprazole (PROTONIX) 40 MG tablet [id number] Yes TAKE 1 TABLET BY MOUTH AT BEDTIME [name],  MD Taking Active   polyethylene glycol [facility] / GLYCOLAX) packet [id number] Yes Take 17 g by mouth daily as needed for mild constipation (MIX AND DRINK AS DIRECTED).  [provider] Taking Active Self  potassium chloride (MICRO-K) 10 MEQ CR capsule [id number] Yes Take 5 capsules (50 mEq total) by mouth 2 (two) times daily. Richardson Dopp T, PA-C Taking Active   propranolol (INDERAL) 10 MG tablet [id number] Yes TAKE 1 TABLET BY MOUTH 2 TIMES DAILY [name], MD Taking Active   revefenacin [facility]) 175 MCG/3ML nebulizer solution [id number] Yes Take 3 mLs (175 mcg total) by nebulization daily. [name], MD Taking Active   rOPINIRole (REQUIP) 4 MG tablet [id number] Yes TAKE 1 TABLET BY MOUTH TWICE DAILY [name],  MD Taking Active   spironolactone (ALDACTONE) 25 MG tablet [id number] Yes TAKE 1 TABLET BY MOUTH DAILY. [name], MD Taking Active   traMADol Veatrice [place]) 50 MG tablet [id number] Yes Take 50 mg by mouth daily as needed for pain. [provider] Taking Active Self  UNABLE TO FIND [id number] Yes CPAP- At bedtime [provider] Taking Active Self  VICTOZA 18 MG/3ML SOPN [id number] Yes INJECT 1.8MG UNDER THE SKIN DAILY [name], MD Taking Active Self           Med Note Earna Coder Jan 15, 2020  2:14 PM)            Patient Active Problem List   Diagnosis Date Noted  . RLS (restless legs syndrome)   . Acute asthma exacerbation 01/09/2020  . Asthma exacerbation 01/08/2020  . Muscle weakness 01/08/2020  . Grade I diastolic dysfunction 93/23/5573  . Statin myopathy 10/10/2019  . Advance care planning 06/14/2019  . RLQ abdominal pain 06/14/2019  . COPD (chronic obstructive pulmonary disease) ([place]) 06/14/2019  . Leukocytosis 06/14/2019  . Hypercalcemia 06/14/2019  . Gout 06/14/2019  . COPD with acute exacerbation ([place]) 05/03/2017  . Acute renal failure superimposed on stage 2 chronic kidney disease ([place]) 04/02/2016  . Diabetes  mellitus without complication ([place])   . CKD (chronic kidney disease)   . Hypersomnia with sleep apnea 09/16/2015  . Fatigue 06/07/2015  . Morbid obesity ([place]) 06/07/2015  . Chronic diastolic heart failure ([place])   . OSA (obstructive sleep apnea) 08/23/2012  . Hypertension   . Depression   . GERD (gastroesophageal reflux disease)   . Hyperlipidemia   . Ulcerative colitis ([place])   . Patellar tendinitis 05/25/2011  . Knee pain 05/25/2011  . Myofascial pain 05/25/2011  . Cervicalgia 05/25/2011    Immunization History  Administered Date(s) Administered  . Influenza Split 12/01/2012, 01/18/2017  . Influenza Whole 12/25/2011  . Influenza,inj,Quad PF,6+ Mos 11/27/2013, 12/08/2014, 12/11/2015  . Influenza-Unspecified 12/07/2017  . Janssen (J&J) SARS-COV-2 Vaccination 06/15/2019  . Pneumococcal Conjugate-13 02/17/2017  . Pneumococcal Polysaccharide-23 11/27/2013    Conditions to be addressed/monitored:  Diabetes - Trulicity conversion  Discussed transition from Victoza to Trulicity. Pt asked about side effects, administration, cost. No concerns. She has 10 days left of Victoza. She would like [place] to go ahead and send the Trulicity to [facility]. Will start at equivalent dose Trulicity 1.5 mg weekly. Instructed patient to start Trulicity the next day after finishing Victoza.  Call if any cost concerns or adverse GI effects.  Patient's preferred pharmacy is: [facility], [address] [place] [address] Phone: [phone] Fax: [phone]  Plan: Initial CCM review May 2022  Debbora Dus, PharmD Clinical Pharmacist [facility] at [facility] [phone]   Encounter details: CCM Time Spent      Value Time User   Time spent with patient (minutes)  20 06/05/2020  1:31 PM Debbora Dus, [facility]   Time spent performing Chart review  20 06/05/2020  1:31 PM Debbora Dus, [facility]   Total time (minutes)  40  06/05/2020  1:31 PM Debbora Dus, RPH     Moderate to High Complex Decision Making      Value Time User   Moderate to High complex decision making  Yes 06/05/2020  1:31 PM Debbora Dus, [facility]     CCM Services: This encounter meets complex CCM services and moderate to high decision making.  Prior to outreach and patient consent for Chronic Care Management, I referred this patient for services after reviewing the nominated patient list or from a personal encounter with the patient.  I have personally reviewed this encounter including the documentation in this note and have collaborated with the care management provider regarding care management and care coordination activities to include development and update of the comprehensive care plan. I am certifying that I agree with the content of this note and encounter as supervising physician.  [name]

## 2020-06-05 NOTE — Telephone Encounter (Signed)
Sent. Thanks.   

## 2020-06-05 NOTE — Telephone Encounter (Signed)
Please see CCM note from 06/05/20. She has been instructed to d/c Victoza once current pen is complete (~1 week) and start Trulicity next day. Recommend Trulicity 1.5 mg weekly. Send to [facility]. Removed Victoza from med list.  Debbora Dus, PharmD Clinical Pharmacist [facility] at [facility] [phone]

## 2020-06-11 ENCOUNTER — Other Ambulatory Visit: Payer: Self-pay

## 2020-06-11 ENCOUNTER — Other Ambulatory Visit (INDEPENDENT_AMBULATORY_CARE_PROVIDER_SITE_OTHER): Payer: Medicare HMO

## 2020-06-11 DIAGNOSIS — R251 Tremor, unspecified: Secondary | ICD-10-CM | POA: Diagnosis not present

## 2020-06-11 DIAGNOSIS — K5730 Diverticulosis of large intestine without perforation or abscess without bleeding: Secondary | ICD-10-CM | POA: Insufficient documentation

## 2020-06-11 DIAGNOSIS — G2581 Restless legs syndrome: Secondary | ICD-10-CM | POA: Diagnosis not present

## 2020-06-11 DIAGNOSIS — E119 Type 2 diabetes mellitus without complications: Secondary | ICD-10-CM | POA: Diagnosis not present

## 2020-06-11 DIAGNOSIS — K5900 Constipation, unspecified: Secondary | ICD-10-CM | POA: Insufficient documentation

## 2020-06-11 DIAGNOSIS — R1033 Periumbilical pain: Secondary | ICD-10-CM | POA: Insufficient documentation

## 2020-06-11 DIAGNOSIS — K589 Irritable bowel syndrome without diarrhea: Secondary | ICD-10-CM | POA: Insufficient documentation

## 2020-06-11 LAB — CBC WITH DIFFERENTIAL/PLATELET
Basophils Absolute: 0 10*3/uL (ref 0.0–0.1)
Basophils Relative: 0.4 % (ref 0.0–3.0)
Eosinophils Absolute: 0.3 10*3/uL (ref 0.0–0.7)
Eosinophils Relative: 2.2 % (ref 0.0–5.0)
HCT: 44 % (ref 36.0–46.0)
Hemoglobin: 14.8 g/dL (ref 12.0–15.0)
Lymphocytes Relative: 40.4 % (ref 12.0–46.0)
Lymphs Abs: 4.6 10*3/uL — ABNORMAL HIGH (ref 0.7–4.0)
MCHC: 33.7 g/dL (ref 30.0–36.0)
MCV: 89.5 fl (ref 78.0–100.0)
Monocytes Absolute: 1.3 10*3/uL — ABNORMAL HIGH (ref 0.1–1.0)
Monocytes Relative: 11.2 % (ref 3.0–12.0)
Neutro Abs: 5.3 10*3/uL (ref 1.4–7.7)
Neutrophils Relative %: 45.8 % (ref 43.0–77.0)
Platelets: 359 10*3/uL (ref 150.0–400.0)
RBC: 4.92 Mil/uL (ref 3.87–5.11)
RDW: 14.2 % (ref 11.5–15.5)
WBC: 11.5 10*3/uL — ABNORMAL HIGH (ref 4.0–10.5)

## 2020-06-11 LAB — COMPREHENSIVE METABOLIC PANEL
ALT: 18 U/L (ref 0–35)
AST: 21 U/L (ref 0–37)
Albumin: 4.4 g/dL (ref 3.5–5.2)
Alkaline Phosphatase: 89 U/L (ref 39–117)
BUN: 46 mg/dL — ABNORMAL HIGH (ref 6–23)
CO2: 40 mEq/L — ABNORMAL HIGH (ref 19–32)
Calcium: 10.6 mg/dL — ABNORMAL HIGH (ref 8.4–10.5)
Chloride: 89 mEq/L — ABNORMAL LOW (ref 96–112)
Creatinine, Ser: 1.75 mg/dL — ABNORMAL HIGH (ref 0.40–1.20)
GFR: 30.23 mL/min — ABNORMAL LOW (ref >60.00)
Glucose, Bld: 127 mg/dL — ABNORMAL HIGH (ref 70–99)
Potassium: 3.7 mEq/L (ref 3.5–5.1)
Sodium: 139 mEq/L (ref 135–145)
Total Bilirubin: 0.4 mg/dL (ref 0.2–1.2)
Total Protein: 8 g/dL (ref 6.0–8.3)

## 2020-06-11 LAB — HEMOGLOBIN A1C: Hgb A1c MFr Bld: 9.9 % — ABNORMAL HIGH (ref 4.6–6.5)

## 2020-06-11 LAB — FERRITIN: Ferritin: 58.3 ng/mL (ref 10.0–291.0)

## 2020-06-11 NOTE — Telephone Encounter (Signed)
Direct Rx form has been completed, signed by Dr. Halford Chessman, and faxed to [phone].  Message routed to [place], [place]

## 2020-06-12 ENCOUNTER — Other Ambulatory Visit: Payer: Self-pay | Admitting: Physician Assistant

## 2020-06-14 DIAGNOSIS — J449 Chronic obstructive pulmonary disease, unspecified: Secondary | ICD-10-CM | POA: Diagnosis not present

## 2020-06-18 ENCOUNTER — Other Ambulatory Visit: Payer: Self-pay

## 2020-06-18 ENCOUNTER — Ambulatory Visit (INDEPENDENT_AMBULATORY_CARE_PROVIDER_SITE_OTHER): Payer: Medicare HMO | Admitting: Family Medicine

## 2020-06-18 ENCOUNTER — Ambulatory Visit (INDEPENDENT_AMBULATORY_CARE_PROVIDER_SITE_OTHER)
Admission: RE | Admit: 2020-06-18 | Discharge: 2020-06-18 | Disposition: A | Discharge status: Home / Self Care | Payer: Medicare HMO | Source: Ambulatory Visit | Attending: Family Medicine | Admitting: Family Medicine

## 2020-06-18 ENCOUNTER — Encounter: Payer: Self-pay | Admitting: Family Medicine

## 2020-06-18 VITALS — BP 124/72 | HR 87 | Temp 97.7°F | Ht 65.0 in | Wt 258.0 lb

## 2020-06-18 DIAGNOSIS — I10 Essential (primary) hypertension: Secondary | ICD-10-CM | POA: Diagnosis not present

## 2020-06-18 DIAGNOSIS — M79673 Pain in unspecified foot: Secondary | ICD-10-CM | POA: Diagnosis not present

## 2020-06-18 DIAGNOSIS — N182 Chronic kidney disease, stage 2 (mild): Secondary | ICD-10-CM

## 2020-06-18 DIAGNOSIS — Z7189 Other specified counseling: Secondary | ICD-10-CM

## 2020-06-18 DIAGNOSIS — M7989 Other specified soft tissue disorders: Secondary | ICD-10-CM | POA: Diagnosis not present

## 2020-06-18 DIAGNOSIS — G4733 Obstructive sleep apnea (adult) (pediatric): Secondary | ICD-10-CM

## 2020-06-18 DIAGNOSIS — Z0000 Encounter for general adult medical examination without abnormal findings: Secondary | ICD-10-CM | POA: Diagnosis not present

## 2020-06-18 DIAGNOSIS — E119 Type 2 diabetes mellitus without complications: Secondary | ICD-10-CM | POA: Diagnosis not present

## 2020-06-18 DIAGNOSIS — M109 Gout, unspecified: Secondary | ICD-10-CM

## 2020-06-18 DIAGNOSIS — M19071 Primary osteoarthritis, right ankle and foot: Secondary | ICD-10-CM | POA: Diagnosis not present

## 2020-06-18 MED ORDER — TRESIBA FLEXTOUCH 200 UNIT/ML ~~LOC~~ SOPN: 90.0000 [IU] | PEN_INJECTOR | Freq: Every day | SUBCUTANEOUS | Status: DC

## 2020-06-18 MED ORDER — METOLAZONE 5 MG PO TABS: 5.0000 mg | ORAL_TABLET | ORAL | 1 refills | Status: DC | PRN

## 2020-06-18 MED ORDER — TRAMADOL HCL 50 MG PO TABS: 50.0000 mg | ORAL_TABLET | Freq: Three times a day (TID) | ORAL | 1 refills | Status: DC | PRN

## 2020-06-18 NOTE — Patient Instructions (Addendum)
Go to the lab on the way out.   If you have mychart we'll likely use that to update you.    We'll go from there.   We'll see about when to get back together when I see your labs.    Take tramadol with tylenol for pain.  We'll call about seeing orthopedics.   Take care.  Glad to see you.

## 2020-06-18 NOTE — Progress Notes (Signed)
This visit occurred during the SARS-CoV-2 public health emergency.  Safety protocols were in place, including screening questions prior to the visit, additional usage of staff PPE, and extensive cleaning of exam room while observing appropriate contact time as indicated for disinfecting solutions.  I have personally reviewed the Medicare Annual Wellness questionnaire and have noted 1. The patient's medical and social history 2. Their use of alcohol, tobacco or illicit drugs 3. Their current medications and supplements 4. The patient's functional ability including ADL's, fall risks, home safety risks and hearing or visual             impairment. 5. Diet and physical activities 6. Evidence for depression or mood disorders  The patients weight, height, BMI have been recorded in the chart and visual acuity is per eye clinic.  I have made referrals, counseling and provided education to the patient based review of the above and I have provided the pt with a written personalized care plan for preventive services.  Provider list updated- see scanned forms.  Routine anticipatory guidance given to patient.  See health maintenance. The possibility exists that previously documented standard health maintenance information may have been brought forward from a previous encounter into this note.  If needed, that same information has been updated to reflect the current situation based on today's encounter.    Flu 2021 per patient report.  Shingles d/w pt.  PNA up to date Tetanus < 10 years ago per patient report.  Colon cancer screening per GI, Dr. Collene Mares.  Breast cancer screening 2021 DXA deferred 2022 given other concerns.  Advance directive d/w pt.  Son Ty designated if patient were incapacitated.   Cognitive function addressed- see scanned forms- and if abnormal then additional documentation follows.   R foot pain.  Took colchicine this AM w/o relief.  No foot trauma.  Able to walk on foot this weekend  but intermittent pain over the last few months to weeks.  Recheck labs pending.  I found out after the fact that outside NP considered starting allopurinol.  See scanned hard copy notes.    DM on victoza with plan to change to trulicity in the near future.  Sugar clearly improved recently with diet changes .  Recent sugar 140 max, usually lower recently.    OSA on CPAP.  Compliant. Sleeping with it nightly, "sleeping pretty good."  Some trouble with air leaks, had gotten a new mask recently.   Hypertension:    Using medication without problems or lightheadedness: yes Chest pain with exertion:no Edema: no Short of breath: improved with metolazone.    WBC similar to prev.  D/w pt.    Last took metolazone about 3 days ago.  Cr elevation d/w pt, along with Ca++. Recheck pending.      PMH and SH reviewed  Meds, vitals, and allergies reviewed.   ROS: Per HPI.  Unless specifically indicated otherwise in HPI, the patient denies:  General: fever. Eyes: acute vision changes ENT: sore throat Cardiovascular: chest pain Respiratory: SOB GI: vomiting GU: dysuria Musculoskeletal: acute back pain Derm: acute rash Neuro: acute motor dysfunction Psych: worsening mood Endocrine: polydipsia Heme: bleeding Allergy: hayfever  GEN: nad, alert and oriented HEENT: ncat NECK: supple w/o LA CV: rrr. PULM: ctab, no inc wob ABD: soft, +bs EXT: no edema SKIN: no acute rash R foot w/o erythema.  Medial and lateral mal not ttp.  Achilles not ttp.  R 5th ttp, post op shoe didn't help.  xrays d/w pt at  [facility], images reviewed.

## 2020-06-19 DIAGNOSIS — M19079 Primary osteoarthritis, unspecified ankle and foot: Secondary | ICD-10-CM | POA: Diagnosis not present

## 2020-06-19 DIAGNOSIS — M19071 Primary osteoarthritis, right ankle and foot: Secondary | ICD-10-CM | POA: Diagnosis not present

## 2020-06-19 LAB — VITAMIN D 25 HYDROXY (VIT D DEFICIENCY, FRACTURES): VITD: 45.52 ng/mL (ref 30.00–100.00)

## 2020-06-19 LAB — BASIC METABOLIC PANEL
BUN: 37 mg/dL — ABNORMAL HIGH (ref 6–23)
CO2: 35 mEq/L — ABNORMAL HIGH (ref 19–32)
Calcium: 10.2 mg/dL (ref 8.4–10.5)
Chloride: 93 mEq/L — ABNORMAL LOW (ref 96–112)
Creatinine, Ser: 1.53 mg/dL — ABNORMAL HIGH (ref 0.40–1.20)
GFR: 35.51 mL/min — ABNORMAL LOW (ref >60.00)
Glucose, Bld: 118 mg/dL — ABNORMAL HIGH (ref 70–99)
Potassium: 3.5 mEq/L (ref 3.5–5.1)
Sodium: 140 mEq/L (ref 135–145)

## 2020-06-19 LAB — URIC ACID: Uric Acid, Serum: 10.4 mg/dL — ABNORMAL HIGH (ref 2.4–7.0)

## 2020-06-20 DIAGNOSIS — Z0000 Encounter for general adult medical examination without abnormal findings: Secondary | ICD-10-CM | POA: Insufficient documentation

## 2020-06-20 DIAGNOSIS — M79673 Pain in unspecified foot: Secondary | ICD-10-CM | POA: Insufficient documentation

## 2020-06-20 NOTE — Assessment & Plan Note (Signed)
Continue cpap.  

## 2020-06-20 NOTE — Assessment & Plan Note (Signed)
See notes on f/u labs.  Cr elevation/Ca++ may be affected by recent metolazone use, just prior to labs.  D/w pt.

## 2020-06-20 NOTE — Assessment & Plan Note (Signed)
Would be atypical presentation of gout. Either way, wouldn't start allopurinol in the midst of flare.  See notes on labs.

## 2020-06-20 NOTE — Assessment & Plan Note (Signed)
Continue propranolol and spironolactone.  See notes on labs.

## 2020-06-20 NOTE — Assessment & Plan Note (Signed)
No fx on xray.  Limit weight bearing for now, she can tolerate tramadol w/o ADE.  D/w pt.  Refer to ortho.  Use tramadol/tylenol for pain.

## 2020-06-20 NOTE — Assessment & Plan Note (Signed)
Flu 2021 per patient report.  Shingles d/w pt.  PNA up to date Tetanus < 10 years ago per patient report.  Colon cancer screening per GI, Dr. Collene Mares.  Breast cancer screening 2021 DXA deferred 2022 given other concerns.  Advance directive d/w pt.  Son Ty designated if patient were incapacitated.   Cognitive function addressed- see scanned forms- and if abnormal then additional documentation follows.

## 2020-06-20 NOTE — Assessment & Plan Note (Signed)
Advance directive d/w pt.  Son Ty designated if patient were incapacitated.

## 2020-06-20 NOTE — Assessment & Plan Note (Signed)
DM on victoza with plan to change to trulicity in the near future.  Sugar clearly improved recently with diet changes .  Recent sugar 140 max, usually lower recently.  See notes on labs.

## 2020-06-23 ENCOUNTER — Telehealth: Payer: Self-pay | Admitting: *Deleted

## 2020-06-23 NOTE — Telephone Encounter (Signed)
Received form from [employer] stating that the [place] solution has been approved.    From 06/17/2020 to 06/17/2021  Will forward to [facility] and Dr. Halford Chessman to make them aware.

## 2020-06-24 NOTE — Telephone Encounter (Signed)
Noted  

## 2020-07-02 ENCOUNTER — Ambulatory Visit: Payer: Medicare HMO | Admitting: Podiatry

## 2020-07-02 ENCOUNTER — Other Ambulatory Visit: Payer: Self-pay

## 2020-07-02 ENCOUNTER — Ambulatory Visit (INDEPENDENT_AMBULATORY_CARE_PROVIDER_SITE_OTHER): Payer: Medicare HMO

## 2020-07-02 DIAGNOSIS — M25871 Other specified joint disorders, right ankle and foot: Secondary | ICD-10-CM | POA: Diagnosis not present

## 2020-07-02 DIAGNOSIS — S9000XA Contusion of unspecified ankle, initial encounter: Secondary | ICD-10-CM

## 2020-07-02 DIAGNOSIS — M659 Synovitis and tenosynovitis, unspecified: Secondary | ICD-10-CM

## 2020-07-02 DIAGNOSIS — M65171 Other infective (teno)synovitis, right ankle and foot: Secondary | ICD-10-CM

## 2020-07-02 MED ORDER — METHYLPREDNISOLONE 4 MG PO TBPK: ORAL_TABLET | ORAL | 0 refills | Status: DC

## 2020-07-02 MED ORDER — MELOXICAM 15 MG PO TABS: 15.0000 mg | ORAL_TABLET | Freq: Every day | ORAL | 1 refills | Status: DC

## 2020-07-02 MED ORDER — BETAMETHASONE SOD PHOS & ACET 6 (3-3) MG/ML IJ SUSP
3.0000 mg | Freq: Once | INTRAMUSCULAR | Status: AC
  Administered 2020-07-02: 3 mg via INTRA_ARTICULAR

## 2020-07-02 NOTE — Progress Notes (Signed)
   Subjective:  65 y.o. female presenting today as a new patient for evaluation of right foot and ankle pain this been going on for approximately 4 months now.  She was seen at [facility] and referred here for continued pain despite treatment there. she denies a history of injury or fall.  She presents for further treatment evaluation   Past Medical History:  Diagnosis Date  . Anemia   . Arthritis   . Asthma   . Chronic diastolic CHF 73/5670   Echocardiogram 05/2019: EF 70, no RWMA, mild LVH, Gr 1 DD, normal RVSF, severe LVH, borderline asc Aorta (39 mm)  . CKD (chronic kidney disease)   . Depression   . Diabetes mellitus without complication ([place])   . Diverticulitis   . Dyspnea    with exertion  . GERD (gastroesophageal reflux disease)   . History of blood transfusion   . Hyperlipidemia    cannot tolerate statins  . Hypertension   . Nuclear stress test    Myoview 05/2019: EF 83, no ischemia or infarction; Low Risk  . Peripheral neuropathy   . Pneumonia   . PONV (postoperative nausea and vomiting)   . RLS (restless legs syndrome)   . Sleep apnea    uses CPAP  . Ulcerative colitis ([place])    dr Collene Mares     Objective / Physical Exam:  General:  The patient is alert and oriented x3 in no acute distress. Dermatology:  Skin is warm, dry and supple bilateral lower extremities. Negative for open lesions or macerations. Vascular:  Palpable pedal pulses bilaterally. No edema or erythema noted. Capillary refill within normal limits. Neurological:  Epicritic and protective threshold grossly intact bilaterally.  Musculoskeletal Exam:  Pain on palpation to the anterior lateral medial aspects of the patient's right ankle. Mild edema noted.  Pain on palpation also noted to the tibial sesamoid right first MTPJ range of motion within normal limits to all pedal and ankle joints bilateral. Muscle strength 5/5 in all groups bilateral.   Radiographic Exam:  Normal osseous mineralization. Joint  spaces preserved. No fracture/dislocation/boney destruction.    Assessment: 1.  Capsulitis/synovitis right ankle 2.  Sesamoiditis right first MTPJ  Plan of Care:  1. Patient was evaluated. X-Rays reviewed.  2. Injection of 0.5 mL [place] Soluspan injected in the patient's right ankle and sesamoidal apparatus right 3.  Prescription for Medrol Dosepak 4.  Prescription for meloxicam 15 mg daily after completion of the Dosepak 5.  Cam boot dispensed.  Weightbearing as tolerated x4 weeks 6.  Return to clinic in 4 weeks   Edrick Kins, DPM [facility]  Dr. Edrick Kins, DPM    [address], [place] [address]                Office [phone]  Fax [phone]

## 2020-07-03 ENCOUNTER — Other Ambulatory Visit: Payer: Self-pay | Admitting: Physician Assistant

## 2020-07-04 ENCOUNTER — Telehealth: Payer: Self-pay | Admitting: Pulmonary Disease

## 2020-07-04 DIAGNOSIS — G4733 Obstructive sleep apnea (adult) (pediatric): Secondary | ICD-10-CM

## 2020-07-04 NOTE — Telephone Encounter (Signed)
Called and spoke with patient. She stated that she wanted clarification on her nebulizer medication. I advised her that based on the last OV note, she is to use the budesonide and Yupelri in the morning and just budesonide in the evening. She is aware that she can use her albuterol as needed for any increased SOB or wheezing.   While on the phone, she stated that she would like switch cpap masks if possible. She would like to try out the nasal pillows. Her current mask is not comfortable and she is not sleeping well. Her DME is [employer].   Dr. Halford Chessman, please advise about the cpap mask. Thanks!

## 2020-07-05 NOTE — Telephone Encounter (Signed)
Okay to send order to get her CPAP mask refitted.

## 2020-07-05 NOTE — Telephone Encounter (Signed)
dme order sent for mask refitting  Spoke with the pt and notified that this was done  Nothing further needed

## 2020-07-15 DIAGNOSIS — J449 Chronic obstructive pulmonary disease, unspecified: Secondary | ICD-10-CM | POA: Diagnosis not present

## 2020-07-22 ENCOUNTER — Telehealth: Payer: Self-pay

## 2020-07-22 NOTE — Chronic Care Management (AMB) (Addendum)
Chronic Care Management Pharmacy Assistant   Name: ATOYA ANDREW  MRN: [id number] DOB: [birth year]  Kathleen Hatfield is an 65 y.o. year old female who presents for her initial CCM visit with the clinical pharmacist.  Reason for Encounter: Initial Questions   Conditions to be addressed/monitored: HTN, HLD, COPD, DMII and CKD Stage 2   Recent office visits:  06/18/2020  Dr.Duncan PCP  Changed Insulin Degludec to 90units subcutaneous daily,changed tramadol 22m to TID, discontinued Formoterol Fumarate(finished course) Labs(uric acid, BMP)  Ordered 05/06/2020  Dr.Duncan PCP added ondansetron 4 mg take 1 tablet every 8 hours prn changed potassium chloride 10 meq to 30 meq 2 times daily discontinued colchicine 0.680mand indomethacin 5087mue to finished course, follow up in 3 months  Recent consult visits:  07/02/2020  BreDaylene KatayamaM  Added meloxicam 5m69mke 1 tablet daily and added methylprednisolone 4mg.5mday dose pak take as directed. Cortisone injection. 05/30/2020  [facility]  Changed Metolazone 5mg. 19mPRN and discontinued potassium chloride 20 meq 05/23/2020 [facility]  Added ciprofloxacin 500mg t48m2 times daily,added metronidazole 250mg ta54m times daily and added Ondansetron 4mg ever18m hours prn  05/20/2020  Dr.[name]  Pulmonology started Fomoterol Fumarate 20mcg neb67mation 2 times daily added Revefenacin 175 mcg nebulization daily follow up 3 months 04/25/2020  [facility]  [place] 03[place]/2020  Dr.Ikechukwu Nwobu   Nephrology  No medication changes,follow up 6 months  Hospital visits:  None in previous 6 months  Medications: Outpatient Encounter Medications as of 07/22/2020  Medication Sig   acetaminophen (TYLENOL) 500 MG tablet Take 1,000 mg by mouth every 6 (six) hours as needed for pain.   albuterol (VENTOLIN HFA) 108 (90 Base) MCG/ACT inhaler Inhale 2 puffs into the lungs every 6 (six)  hours as needed.   BAYER CONTOUR NEXT TEST test strip 1 each by Other route daily. As directed   budesonide (PULMICORT) 0.5 MG/2ML nebulizer solution Take 2 mLs (0.5 mg total) by nebulization 2 (two) times daily.   buPROPion (WELLBUTRIN XL) 300 MG 24 hr tablet TAKE 1 TABLET BY MOUTH DAILY   Cholecalciferol (VITAMIN D-3) 5000 UNITS TABS Take 5,000 Units by mouth every other day.    colchicine 0.6 MG tablet Take 1 tablet (0.6 mg total) by mouth daily as needed (for gout).   dicyclomine (BENTYL) 20 MG tablet TAKE 1 TABLET UP TO FOUR TIMES A DAY FOR GI CRAMPING, PAIN, AND NAUSEA/VOMITING AS NEEDED   Dulaglutide (TRULICITY) 1.5 MG/0.5ML SDH/7.4BUct 1.5 mg into the skin once a week.   DULoxetine (CYMBALTA) 60 MG capsule TAKE 1 CAPSULE BY MOUTH ONCE DAILY.   furosemide (LASIX) 20 MG tablet Take 3 tablets (60 mg total) by mouth 2 (two) times daily.   gabapentin (NEURONTIN) 100 MG capsule TAKE 3 CAPSULES BY MOUTH AT BEDTIME.   GLOBAL EASE INJECT PEN NEEDLES 32G X 4 MM MISC 1 each by Other route daily.    insulin degludec (TRESIBA FLEXTOUCH) 200 UNIT/ML FlexTouch Pen Inject 90 Units into the skin daily.   ipratropium-albuterol (DUONEB) 0.5-2.5 (3) MG/3ML SOLN Take 3 mLs by nebulization See admin instructions. Nebulize 3 ml's and inhale into the lungs two to three times a day   meloxicam (MOBIC) 15 MG tablet Take 1 tablet (15 mg total) by mouth daily.   mesalamine (LIALDA) 1.2 g EC tablet Take 1.2 g by mouth daily with breakfast. Take 1.2 grams  by mouth once a day with breakfast- increase as directed   methylPREDNISolone (MEDROL DOSEPAK) 4 MG TBPK tablet 6 day dose pack - take as directed   metolazone (ZAROXOLYN) 5 MG tablet Take 1 tablet (5 mg total) by mouth as needed.   MICROLET LANCETS MISC 1 each by Other route. As directed   ondansetron (ZOFRAN) 4 MG tablet Take 1 tablet (4 mg total) by mouth every 6 (six) hours.   pantoprazole (PROTONIX) 40 MG tablet TAKE 1 TABLET BY MOUTH AT BEDTIME   polyethylene  glycol (MIRALAX / GLYCOLAX) packet Take 17 g by mouth daily as needed for mild constipation (MIX AND DRINK AS DIRECTED).    potassium chloride (MICRO-K) 10 MEQ CR capsule TAKE 5 CAPSULES (50 MEQ TOTAL) BY MOUTH 2 (TWO) TIMES DAILY.   propranolol (INDERAL) 10 MG tablet TAKE 1 TABLET BY MOUTH 2 TIMES DAILY   revefenacin (YUPELRI) 175 MCG/3ML nebulizer solution Take 3 mLs (175 mcg total) by nebulization daily.   rOPINIRole (REQUIP) 4 MG tablet TAKE 1 TABLET BY MOUTH TWICE DAILY   spironolactone (ALDACTONE) 25 MG tablet TAKE 1 TABLET BY MOUTH DAILY.   traMADol (ULTRAM) 50 MG tablet Take 1 tablet (50 mg total) by mouth 3 (three) times daily as needed.   UNABLE TO FIND CPAP- At bedtime   No facility-administered encounter medications on file as of 07/22/2020.     Lab Results  Component Value Date/Time   HGBA1C 9.9 (H) 06/11/2020 08:35 AM   HGBA1C 11.5 (A) 05/06/2020 02:38 PM   HGBA1C 9.2 (H) 01/09/2020 05:30 AM     BP Readings from Last 3 Encounters:  06/18/20 124/72  05/30/20 120/80  05/20/20 140/82   Have you seen any other providers since your last visit with PCP? Yes   Any changes in your medications or health? Yes the patient is having some foot pain and is healing this with a wrap and non weightbearing   Any side effects from any medications? No  Do you have an symptoms or problems not managed by your medications? No  Any concerns about your health right now? Yes the patient is healing up her foot  Has your provider asked that you check blood pressure, blood sugar, or follow special diet at home? Yes The patient reports she is on a salt free, sugar free, gluten free diet and she checks her BG and BP regularly  Do you get any type of exercise on a regular basis? No she is having a gout flare up and foot is painful and the patient states her knees are bone on bone  Can you think of a goal you would like to reach for your health? Yes the patient reports she is trying to heal her  foot  Do you have any problems getting your medications? No The patient reports she is doing mail order on her breathing medications and this is going well  Is there anything that you would like to discuss during the appointment? No   TERRILYNN POSTELL was reminded to have all medications, supplements and any blood glucose and blood pressure readings available for review with Debbora Dus, Pharm. D, at her telephone visit on 07/29/2020 at 2:00pm     Star Rating Drugs: Medication:   Last Fill: Day Supply Trulicity 1.5 QT/6.2UQ  06/05/2020 28  Follow-Up:  Pharmacist Review  Debbora Dus, CPP notified  [place] Assistant [phone]   I have reviewed the care management and care coordination activities outlined in  this encounter and I am certifying that I agree with the content of this note. No further action required.  Debbora Dus, PharmD Clinical Pharmacist [facility] [phone]

## 2020-07-26 ENCOUNTER — Other Ambulatory Visit: Payer: Self-pay | Admitting: Family Medicine

## 2020-07-29 ENCOUNTER — Other Ambulatory Visit: Payer: Self-pay

## 2020-07-29 ENCOUNTER — Ambulatory Visit (INDEPENDENT_AMBULATORY_CARE_PROVIDER_SITE_OTHER): Payer: Medicare HMO

## 2020-07-29 DIAGNOSIS — J449 Chronic obstructive pulmonary disease, unspecified: Secondary | ICD-10-CM

## 2020-07-29 DIAGNOSIS — I10 Essential (primary) hypertension: Secondary | ICD-10-CM

## 2020-07-29 DIAGNOSIS — E119 Type 2 diabetes mellitus without complications: Secondary | ICD-10-CM

## 2020-07-29 NOTE — Progress Notes (Signed)
Chronic Care Management Pharmacy Note  07/29/2020 Name:  Kathleen Hatfield MRN:  [id number] DOB:  Mar 12, 1955  Subjective: Kathleen Hatfield is an 65 y.o. year old female who is a primary patient of [name], [name], MD.  The CCM team was consulted for assistance with disease management and care coordination needs.    Engaged with patient by telephone for initial visit in response to provider referral for pharmacy case management and/or care coordination services. [name] reports her biggest concern is her breathing. She has a hard time telling if her shortness of breath is from fluid or asthma. She watches her weight. Dry weight is 255 lbs. Highest its been in the last 3 weeks 263 lbs. She states if 3 lbs over (258lbs +) she will take metolazone. She is bone-on-bone in both knees, unable to walk. She states she has lost 20 lbs and has to lose some more before she can have surgery. She can walk a few steps but not any distances. She has seen a nutritionist but states it has not been helpful. Needs a gluten free, low salt, low carbohydrate diet. She reads labels. She cooks and eats pre-made meals. Her primary goal is portion control.  General day -  Wakes up anywhere from 8 AM-10 AM -Goes to bathroom, weighs, starts taking her medications while eating breakfast -She spends time on [employer] selling items during day -Naps throughout the day - short naps < 30 minutes, multiple times per day -5 PM - starts on evening meal, she cooks dinner or boyfriend grills hamburgers  -He cleans up dinner -She takes a 30 minute nap after evening meal  -She may play a game on computer or [employer] until 2-3 AM -Sleeps 3-5 hours each night, from around 3 AM-8 AM  -Reports sometimes her meds make her very drowsy - primarily concerned about propranolol  She states her schedule has been like this for years. She has always worked 2nd or 3rd shift. She also cared for her parents who stayed up late. She has been sedentary and limited  mobility for 2-3 years.  Consent to Services:  The patient was given the following information about Chronic Care Management services today, agreed to services, and gave verbal consent: 1. CCM service includes personalized support from designated clinical staff supervised by the primary care provider, including individualized plan of care and coordination with other care providers 2. 24/7 contact phone numbers for assistance for urgent and routine care needs. 3. Service will only be billed when office clinical staff spend 20 minutes or more in a month to coordinate care. 4. Only one practitioner may furnish and bill the service in a calendar month. 5.The patient may stop CCM services at any time (effective at the end of the month) by phone call to the office staff. 6. The patient will be responsible for cost sharing (co-pay) of up to 20% of the service fee (after annual deductible is met). Patient agreed to services and consent obtained.  Patient Care Team: [name], MD as PCP - General (Family Medicine) [name], MD as PCP - Cardiology (Cardiology) [name], MD as Consulting Physician (Pulmonary Disease) [name], MD as Consulting Physician (General Surgery) Douglass, [name], MD as Consulting Physician (Pulmonary Disease) Debbora Dus, [facility] as Pharmacist (Pharmacist) Dannielle Karvonen, RN as [place] Management  Recent office visits:   06/18/2020  Dr.Duncan PCP -  Increased Insulin Degludec to 90units subcutaneous daily  05/06/2020  Dr.Duncan PCP - Start ondansetron 4 mg as needed. Increased potassium chloride 10 meq to 30 meq 2 times daily   Recent consult visits:   07/02/2020  Daylene Katayama DPM - Added meloxicam 50m take 1 tablet daily and added methylprednisolone 486m 6 day dose pak take as directed. Cortisone injection.  05/30/2020  Dr. MiSherren Mochaardiology - Changed Metolazone 5 mg to PRN and discontinued potassium chloride 20  meq  05/23/2020  StFaustino CongressP [facility] - Start Cipro   05/20/2020  Dr. ViChesley Miresulmonology - Started Fomoterol Fumarate 2027mnebulization 2 times daily added Revefenacin 175 mcg nebulization daily follow up 3 months  03/22/2020  Dr. IkeRed Christiansphrology - No medication changes, Follow up 6 months  Hospital visits:  None in previous 6 months  Objective:  Lab Results  Component Value Date   CREATININE 1.53 (H) 06/18/2020   BUN 37 (H) 06/18/2020   GFR 35.51 (L) 06/18/2020   GFRNONAA 60 01/19/2020   GFRAA 69 01/19/2020   NA 140 06/18/2020   K 3.5 06/18/2020   CALCIUM 10.2 06/18/2020   CO2 35 (H) 06/18/2020   GLUCOSE 118 (H) 06/18/2020    Lab Results  Component Value Date/Time   HGBA1C 9.9 (H) 06/11/2020 08:35 AM   HGBA1C 11.5 (A) 05/06/2020 02:38 PM   HGBA1C 9.2 (H) 01/09/2020 05:30 AM   GFR 35.51 (L) 06/18/2020 04:52 PM   GFR 30.23 (L) 06/11/2020 08:35 AM    Last diabetic Eye exam:  Lab Results  Component Value Date/Time   HMDIABEYEEXA No Retinopathy 12/22/2019 12:00 AM    Last diabetic Foot exam: 04/16/20 - normal    Lab Results  Component Value Date   CHOL 333 04/26/2019   HDL 34 (L) 03/25/2013   LDLCALC 133 (H) 03/25/2013   TRIG 469 (A) 04/26/2019   CHOLHDL 6.9 03/25/2013    Hepatic Function Latest Ref Rng & Units 06/11/2020 09/04/2019 06/22/2019  Total Protein 6.0 - 8.3 g/dL 8.0 - 7.6  Albumin 3.5 - 5.2 g/dL 4.4 3.9 4.1  AST 0 - 37 U/L 21 - 17  ALT 0 - 35 U/L 18 - 16  Alk Phosphatase 39 - 117 U/L 89 - 86  Total Bilirubin 0.2 - 1.2 mg/dL 0.4 - 0.5  Bilirubin, Direct 0.0 - 0.3 mg/dL - - -    Lab Results  Component Value Date/Time   TSH 2.850 06/09/2019 12:50 PM   TSH 2.31 04/15/2015 03:45 PM    CBC Latest Ref Rng & Units 06/11/2020 01/22/2020 01/10/2020  WBC 4.0 - 10.5 K/uL 11.5(H) 10.7(H) 17.2(H)  Hemoglobin 12.0 - 15.0 g/dL 14.8 13.1 14.0  Hematocrit 36.0 - 46.0 % 44.0 39.2 41.6  Platelets 150.0 - 400.0 K/uL 359.0 325.0 306     Lab Results  Component Value Date/Time   VD25OH 45.52 06/18/2020 04:52 PM   VD25OH 47.60 06/22/2019 01:59 PM    Clinical ASCVD: No  The ASCVD Risk score (GoMikey Bussing Jr., et al., 2013) failed to calculate for the following reasons:   The valid total cholesterol range is 130 to 320 mg/dL    Dep[facility] 12/28/2017 09/13/2017 03/29/2017  Decreased Interest 1 3 3   Down, Depressed, Hopeless 1 3 2   PHQ - 2 Score 2 6 5   Altered sleeping 3 3 3   Tired, decreased energy 3 3 3   Change in appetite 0 1 1  Feeling bad or failure about yourself  3 3 3   Trouble concentrating 0 3 3  Moving  slowly or fidgety/restless 3 3 2   Suicidal thoughts 0 0 0  PHQ-9 Score 14 22 20   Difficult doing work/chores Very difficult Very difficult Extremely dIfficult  Some recent data might be hidden    Social History   Tobacco Use  Smoking Status Never Smoker  Smokeless Tobacco Never Used   BP Readings from Last 3 Encounters:  06/18/20 124/72  05/30/20 120/80  05/20/20 140/82   Pulse Readings from Last 3 Encounters:  06/18/20 87  05/30/20 88  05/20/20 97   Wt Readings from Last 3 Encounters:  06/18/20 258 lb (117 kg)  05/30/20 263 lb 9.6 oz (119.6 kg)  05/20/20 260 lb 12.8 oz (118.3 kg)   BMI Readings from Last 3 Encounters:  06/18/20 42.93 kg/m  05/30/20 43.87 kg/m  05/20/20 43.40 kg/m   Iron/TIBC/Ferritin/ %Sat    Component Value Date/Time   IRON 66 09/19/2019 0000   TIBC 430 09/19/2019 0000   FERRITIN 58.3 06/11/2020 0835   FERRITIN 50 09/19/2019 0000   IRONPCTSAT 15 09/19/2019 0000    Assessment/Interventions: Review of patient past medical history, allergies, medications, health status, including review of consultants reports, laboratory and other test data, was performed as part of comprehensive evaluation and provision of chronic care management services.   SDOH:  (Social Determinants of Health) assessments and interventions performed: Yes SDOH Interventions    Flowsheet Row Most Recent Value  SDOH Interventions   Financial Strain Interventions Intervention Not Indicated     SDOH Screenings   Alcohol Screen: Not on file  Depression (YQM5-7): Not on file  Financial Resource Strain: Low Risk   . Difficulty of Paying Living Expenses: Not very hard  Food Insecurity: Not on file  Housing: Not on file  Physical Activity: Not on file  Social Connections: Not on file  Stress: Not on file  Tobacco Use: Low Risk   . Smoking Tobacco Use: Never Smoker  . Smokeless Tobacco Use: Never Used  Transportation Needs: Not on file    CCM Care Plan  Allergies  Allergen Reactions  . Almond Oil Anaphylaxis, Shortness Of Breath and Swelling  . Morphine And Related Shortness Of Breath and Other (See Comments)    Pt. States while in the hospital it affected her breathing, O2 dropped to the 70's  . Atorvastatin Other (See Comments)    Leg weakness  . Ceclor [Cefaclor] Nausea And Vomiting  . Ciprofloxacin Other (See Comments)    Makes joints and muscles ache  . Levaquin [Levofloxacin] Other (See Comments)    Body aches  . Losartan Other (See Comments)    Weakness   . Statins Other (See Comments)    Leg and body weakness  . Sulfa Antibiotics Nausea And Vomiting    Medications Reviewed Today    Reviewed by Debbora Dus, [facility] (Pharmacist) on 07/29/20 at [place] List Status: <None>  Medication Order Taking? Sig Documenting Provider Last Dose Status Informant  acetaminophen (TYLENOL) 500 MG tablet [id number] Yes Take 1,000 mg by mouth every 6 (six) hours as needed for pain. [provider] Taking Active Self  albuterol (VENTOLIN HFA) 108 (90 Base) MCG/ACT inhaler [id number] Yes Inhale 2 puffs into the lungs every 6 (six) hours as needed. [name], MD Taking Active   BAYER CONTOUR NEXT TEST test strip [id number] Yes 1 each by Other route daily. As directed [provider] Taking Active Self           Med Note Tamala Julian, JEFFREY W    Mon  May 03, 2017 11:02 PM)    budesonide (PULMICORT) 0.5 MG/2ML nebulizer solution [id number] Yes Take 2 mLs (0.5 mg total) by nebulization 2 (two) times daily. [name], MD Taking Active   buPROPion (WELLBUTRIN XL) 300 MG 24 hr tablet [id number] Yes TAKE 1 TABLET BY MOUTH DAILY [name], MD Taking Active Self  Cholecalciferol (VITAMIN D-3) 5000 UNITS TABS [id number] Yes Take 5,000 Units by mouth every other day.  [provider] Taking Active Self  colchicine 0.6 MG tablet [id number] Yes Take 1 tablet (0.6 mg total) by mouth daily as needed (for gout). [name], MD Taking Active   dicyclomine (BENTYL) 20 MG tablet [id number] Yes TAKE 1 TABLET UP TO FOUR TIMES A DAY FOR GI CRAMPING, PAIN, AND NAUSEA/VOMITING AS NEEDED [name], MD Taking Active Self  Dulaglutide (TRULICITY) 1.5 HF/0.2OV SOPN [id number] Yes Inject 1.5 mg into the skin once a week. [name], MD Taking Active   DULoxetine (CYMBALTA) 60 MG capsule [id number] Yes TAKE 1 CAPSULE BY MOUTH ONCE DAILY. [name], MD Taking Active   furosemide (LASIX) 20 MG tablet [id number] Yes Take 3 tablets (60 mg total) by mouth 2 (two) times daily. Richardson Dopp T, PA-C Taking Active   gabapentin (NEURONTIN) 100 MG capsule [id number] Yes TAKE 3 CAPSULES BY MOUTH AT BEDTIME. [name], MD Taking Active   GLOBAL EASE INJECT PEN NEEDLES 32G X 4 MM MISC [id number] Yes 1 each by Other route daily.  [provider] Taking Active Self           Med Note Duffy Bruce, Ardelia Mems Oct 21, 2015  3:13 PM)    ipratropium-albuterol (DUONEB) 0.5-2.5 (3) MG/3ML SOLN [id number] Yes Take 3 mLs by nebulization See admin instructions. Nebulize 3 ml's and inhale into the lungs two to three times a day [provider] Taking Active Self  meloxicam (MOBIC) 15 MG tablet [id number] Yes Take 1 tablet (15 mg total) by mouth daily. Edrick Kins, DPM Taking Active   mesalamine (LIALDA) 1.2 g EC tablet [id number] No Take 1.2  g by mouth daily with breakfast. Take 1.2 grams by mouth once a day with breakfast- increase as directed  Patient not taking: Reported on 07/29/2020   [provider] Not Taking Active Self  methylPREDNISolone (MEDROL DOSEPAK) 4 MG TBPK tablet [id number] No 6 day dose pack - take as directed  Patient not taking: Reported on 07/29/2020   Edrick Kins, DPM Not Taking Consider Medication Status and Discontinue   metolazone (ZAROXOLYN) 5 MG tablet [id number] Yes Take 1 tablet (5 mg total) by mouth as needed. [name], MD Taking Active   MICROLET LANCETS [place] [id number]  1 each by Other route. As directed [provider]  Active Self           Med Note Mickie Bail Oct 21, 2015  3:31 PM)    ondansetron (ZOFRAN) 4 MG tablet [id number] Yes Take 1 tablet (4 mg total) by mouth every 6 (six) hours. Faustino Congress, NP Taking Active   pantoprazole (PROTONIX) 40 MG tablet [id number] Yes TAKE 1 TABLET BY MOUTH AT BEDTIME [name], MD Taking Active   polyethylene glycol [facility] / GLYCOLAX) packet [id number] Yes Take 17 g by mouth daily as needed for mild constipation (MIX AND DRINK AS DIRECTED).  [provider] Taking Active Self  potassium chloride (MICRO-K) 10 MEQ CR capsule [id number] Yes TAKE 5 CAPSULES (50 MEQ  TOTAL) BY MOUTH 2 (TWO) TIMES DAILY. Richardson Dopp T, PA-C Taking Active   propranolol (INDERAL) 10 MG tablet [id number] Yes TAKE 1 TABLET BY MOUTH 2 TIMES DAILY [name], MD Taking Active   revefenacin [facility]) 175 MCG/3ML nebulizer solution [id number] Yes Take 3 mLs (175 mcg total) by nebulization daily. [name], MD Taking Active   rOPINIRole (REQUIP) 4 MG tablet [id number] Yes TAKE 1 TABLET BY MOUTH TWICE DAILY [name], MD Taking Active   spironolactone (ALDACTONE) 25 MG tablet [id number] Yes TAKE 1 TABLET BY MOUTH DAILY. [name], MD Taking Active   traMADol Veatrice [place]bon) 50 MG tablet [id number] Yes Take 1 tablet (50 mg total)  by mouth 3 (three) times daily as needed. [name], MD Taking Active   TRESIBA FLEXTOUCH 200 UNIT/ML FlexTouch Pen [id number] Yes INJECT 81 UNITS UNDER SKIN ONCE DAILY [name], MD Taking Active   UNABLE TO FIND [id number] Yes CPAP- At bedtime [provider] Taking Active Self          Patient Active Problem List   Diagnosis Date Noted  . Pain of foot 06/20/2020  . Medicare annual wellness visit, initial 06/20/2020  . Constipation 06/11/2020  . Diverticulosis of colon 06/11/2020  . Irritable bowel syndrome 06/11/2020  . Periumbilical pain 15/17/6160  . RLS (restless legs syndrome)   . Acute asthma exacerbation 01/09/2020  . Asthma exacerbation 01/08/2020  . Muscle weakness 01/08/2020  . Grade I diastolic dysfunction 73/71/0626  . Statin myopathy 10/10/2019  . Advance care planning 06/14/2019  . RLQ abdominal pain 06/14/2019  . COPD (chronic obstructive pulmonary disease) ([place]) 06/14/2019  . Leukocytosis 06/14/2019  . Hypercalcemia 06/14/2019  . Gout 06/14/2019  . COPD with acute exacerbation ([place]) 05/03/2017  . Acute renal failure superimposed on stage 2 chronic kidney disease ([place]) 04/02/2016  . Diabetes mellitus without complication ([place])   . CKD (chronic kidney disease)   . Hypersomnia with sleep apnea 09/16/2015  . Fatigue 06/07/2015  . Morbid obesity ([place]) 06/07/2015  . Chronic diastolic heart failure ([place])   . OSA (obstructive sleep apnea) 08/23/2012  . Hypertension   . Depression   . GERD (gastroesophageal reflux disease)   . Hyperlipidemia   . Ulcerative colitis ([place])   . Patellar tendinitis 05/25/2011  . Knee pain 05/25/2011  . Myofascial pain 05/25/2011  . Cervicalgia 05/25/2011    Immunization History  Administered Date(s) Administered  . Influenza Split 12/01/2012, 01/18/2017  . Influenza Whole 12/25/2011  . Influenza,inj,Quad PF,6+ Mos 11/27/2013, 12/08/2014, 12/11/2015  . Influenza-Unspecified 12/07/2017  . Janssen (J&J)  SARS-COV-2 Vaccination 06/15/2019  . Pneumococcal Conjugate-13 02/17/2017  . Pneumococcal Polysaccharide-23 11/27/2013    Conditions to be addressed/monitored:  Hypertension, Hyperlipidemia, Diabetes, Heart Failure, COPD, Asthma, Chronic Kidney Disease, Depression and Osteoarthritis  Care Plan : [place]  Updates made by Debbora Dus, [place] since 08/06/2020 12:00 AM    Problem: CHL AMB "PATIENT-SPECIFIC PROBLEM"     Long-Range Goal: Disease Management   Start Date: 07/29/2020  Priority: High  Note:   Current Barriers:  . Unable to maintain control of diabetes  . Difficulty remembering to take mid-day and afternoon medications . Frequent shortness of breath and limited activity   Pharmacist Clinical Goal(s):  Marland Kitchen Patient will contact provider office for questions/concerns as evidenced notation of same in electronic health record through collaboration with PharmD and provider.   Interventions: . 1:1 collaboration with [name], MD regarding development and update of comprehensive plan of  care as evidenced by provider attestation and co-signature . Inter-disciplinary care team collaboration (see longitudinal plan of care) . Comprehensive medication review performed; medication list updated in electronic medical record  Hypertension (BP goal < 140/90) Controlled - per clinic readings, however, pt concerned about side effects -Current treatment:  Propranolol 10 mg - 1 twice daily  -Medications previously tried: metoprolol (switched to propranolol due to tremors 10/2019) -Pt states the propranolol makes her very drowsy. -Reviewed other sleep inducing medications: ropinirole (morning dose) -Plan: Review chart for medication history and reisit concern at follow up visit.  Hyperlipidemia: (LDL goal < 100) -Not ideally controlled TG 460 2021, LDL inaccurate due to elevated TG Risk factors - diabetes  -Current treatment: . None -Medications previously tried:  Multiple statins (atorvastatin, others) - leg weakness -Educated on Cholesterol goals;  -Recommended  weight loss and diabetes control to reduce cardiovascular risk  Diabetes (A1c goal <7%) -Uncontrolled - A1c 9.9% -Current medications: . Trulicity 1.5 mg - Inject once weekly on Thursdays (she took 3rd dose this week) . Tyler Aas - 90 units daily  -Medications previously tried: none  -She was on prednisone end of April 2022. Reports BG was high for several weeks and leveled back out around 5/12. -Current home glucose readings - checking daily in the morning  . fasting glucose:  . 07/18/20 - 134 . 07/19/20 - 139 . 07/20/20 - 115 . 07/21/20 - 124 . 07/24/20 - 175 . 07/26/20 - 190 . 07/27/20 - 148 . 07/28/20 - 139 . 07/29/20 - 143 . post prandial glucose: none  -Denies hypoglycemic/hyperglycemic symptoms, denies any BG levels < 70 -Current meal patterns: The patient reports she is on a salt free, sugar free, gluten free diet but had a hard time sticking to this. She tries to limit portions.  -Educated on A1c and blood sugar goals; -Counseled to check feet daily and get yearly eye exams - annual eye and foot exam up to date  -Recommended to continue current medication; Follow up in 4 weeks to consider Trulicity dose increase.  Heart Failure (Goal: manage symptoms and prevent exacerbations) -Controlled - stable, no exacerbations in past 12 months -Last ejection fraction: 70% normal (Date: 03/21) -HF type: Diastolic -NYHA Class: II (slight limitation of activity) -AHA HF Stage: C (Heart disease and symptoms present) -Current treatment: . Spironolactone 25 mg - 1 tablet daily in morning  . Furosemide 20 mg - 3 tablets at breakfast and noon  . Potassium 10 mEq - 5 capsules at breakfast and noon . Metolazone - take 1 tablet as needed for weight gain of 3 pounds in 24 hours or 5 pounds in 1 week -Medications previously tried: none  -Current home BP/HR readings: none reported -Educated on  Importance of weighing daily; if you gain more than 3 pounds in one day or 5 pounds in one week, take metolazone Proper diuretic administration and potassium supplementation Importance of blood pressure control  We discussed: adherence - Pt endorses 80% compliance - 1-2 days per week misses evening dose of furosemide and potassium. She takes 8 potassium instead of 10 some days due to stomach feeling full. -Recommended to continue current medication; We discussed trying to remember to take second dose furosemide at 3 PM (3 tablets) and potassium at 5 PM at supper (5 tablets).  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) -Followed by pulmonology, last seen 05/2020 -Current treatment  Maintenance:  Pulmicort nebuilzer - twice daily  Yupelri nebuilzer-  Once daily in the  morning before Pulmicort  Rescue:  Albuterol nebulizer - takes 2-3 times per day as needed -Medications previously tried: inhalers less effective, Perforomist was unaffordable  -Gold Grade: Gold 3 (FEV1 30-49%) -Current COPD Classification:  B (high sx, <2 exacerbations/yr) -MMRC/CAT score: not assessed -Pulmonary function testing: 2021 FEV1 % pred (post) 42% -Exacerbations requiring treatment in last 6 months: 01/2020 - required hospitalizations  -Patient reports consistent use of maintenance inhaler -Frequency of rescue inhaler use: 2-3 times daily  -Recommended to continue current medication  Depression/Anxiety/Pain(Goal: Control symptoms) -Controlled - per patient report -Current treatment: . Bupropion 300 mg - 1 tablet daily  . Cymbalta 60 mg - 1 in the evening (for mood and neuropathy) . Gabapentin 100 mg - 3 caps at bedtime  . Ropinirole 4 mg - 1 tablet twice daily for RLS  -Medications previously tried/failed: none -Recommended to continue current medication  Gout (Goal: No gout flares) Controlled - per patient report -Current treatment: Colchicine - 1 daily at first sign of  flare -Recommended to continue current medication  Patient Goals/Self-Care Activities . Patient will:  - focus on medication adherence by taking second dose of furosemide at 3 PM and potassium at 5 PM daily; avoid missed doses   Follow Up Plan: Telephone follow up appointment with care management team member scheduled for: 30 days       Medication Assistance: None required.  Patient affirms current coverage meets needs.  Patient's preferred pharmacy is: [facility], [place] [address] Phone: [phone] Fax: [phone]  [id number] [place] [address] [address]Phone: [phone]: [phone] Uses pill box? No - she uses pill packs for maintenance meds  -She has a basket with the rest of her medications - inhalers, injections -She has a hard time remembering if she took them, including her packaged medications due to the timing throughout the day -She has a pack for breakfast and evening.  Other -Lialda - not taking currently  As needed:  -Bentyl - uses as needed for flares -Tylenol - 1 before bedtime -Meloxicam 15 mg - short term for arthritis in ankle -Metolazone - for fluid overload  -Tramadol - PRN diverticulitis  Not in packs but daily -Furosemide - 3 in morning and 3 at lunch (4 hours after breakfast) -Potassium - 5 twice daily (capsules - tablets were difficult to swallow)  Morning -Wellbutrin - daily  -Vitamin D3 - every other day -Ropinirole 4 mg - twice daily  -Pantoprazole 40 mg -Spironolactone daily   Evening  -Cymbalta - daily  She gets packaged medications from [facility]. [facility].   Care Plan and Follow Up Patient Decision:  Patient agrees to Care Plan and Follow-up.  MDebbora Dus PharmD Clinical Pharmacist LPleasantonPrimary Care at [phone]

## 2020-07-31 ENCOUNTER — Other Ambulatory Visit: Payer: Self-pay | Admitting: Family Medicine

## 2020-08-01 ENCOUNTER — Ambulatory Visit: Payer: Medicare HMO | Admitting: Podiatry

## 2020-08-02 ENCOUNTER — Ambulatory Visit: Payer: Medicare HMO | Admitting: Podiatry

## 2020-08-02 ENCOUNTER — Other Ambulatory Visit: Payer: Self-pay

## 2020-08-02 ENCOUNTER — Encounter: Payer: Self-pay | Admitting: Podiatry

## 2020-08-02 DIAGNOSIS — M659 Synovitis and tenosynovitis, unspecified: Secondary | ICD-10-CM | POA: Diagnosis not present

## 2020-08-02 DIAGNOSIS — M25871 Other specified joint disorders, right ankle and foot: Secondary | ICD-10-CM | POA: Diagnosis not present

## 2020-08-02 NOTE — Progress Notes (Signed)
   Subjective:  65 y.o. female presenting today for follow-up evaluation of right ankle synovitis as well as sesamoiditis to the right foot.  Patient states that she is doing significantly better.  She has no pain associated to these areas anymore.  She took the Medrol Dosepak as prescribed and has been wearing the cam boot and taking meloxicam.  No new complaints at this time   Past Medical History:  Diagnosis Date  . Anemia   . Arthritis   . Asthma   . Chronic diastolic CHF 62/9528   Echocardiogram 05/2019: EF 70, no RWMA, mild LVH, Gr 1 DD, normal RVSF, severe LVH, borderline asc Aorta (39 mm)  . CKD (chronic kidney disease)   . Depression   . Diabetes mellitus without complication ([place])   . Diverticulitis   . Dyspnea    with exertion  . GERD (gastroesophageal reflux disease)   . History of blood transfusion   . Hyperlipidemia    cannot tolerate statins  . Hypertension   . Nuclear stress test    Myoview 05/2019: EF 83, no ischemia or infarction; Low Risk  . Peripheral neuropathy   . Pneumonia   . PONV (postoperative nausea and vomiting)   . RLS (restless legs syndrome)   . Sleep apnea    uses CPAP  . Ulcerative colitis ([place])    dr Collene Mares     Objective / Physical Exam:  General:  The patient is alert and oriented x3 in no acute distress. Dermatology:  Skin is warm, dry and supple bilateral lower extremities. Negative for open lesions or macerations. Vascular:  Palpable pedal pulses bilaterally. No edema or erythema noted. Capillary refill within normal limits. Neurological:  Epicritic and protective threshold grossly intact bilaterally.  Musculoskeletal Exam:  Negative for any significant pain on palpation to the anterior lateral medial aspects of the patient's right ankle. Mild edema noted.  Negative for pain on palpation also to the tibial sesamoid right first MTPJ range of motion within normal limits to all pedal and ankle joints bilateral. Muscle strength 5/5 in all  groups bilateral.   Assessment: 1.  Capsulitis/synovitis right ankle 2.  Sesamoiditis right first MTPJ  Plan of Care:  1. Patient was evaluated.  2.  Continue meloxicam 15 mg daily as needed 3.  Recommend good arch supports that the patient can wear in her shoes 4.  Discontinue cam boot.  Good supportive ankle brace dispensed.  Wear daily as needed 5.  Return to clinic as needed  *Goes by [place].  Presenting today with her husband   Edrick Kins, DPM [facility]  Dr. Edrick Kins, DPM    [address], [place] [address]                Office [phone]  Fax [phone]

## 2020-08-06 ENCOUNTER — Telehealth: Payer: Self-pay | Admitting: *Deleted

## 2020-08-06 NOTE — Patient Instructions (Signed)
Aug 06, 2020  Dear Kathleen Hatfield,  It was a pleasure meeting you during our initial appointment on Jul 29, 2020. Below is a summary of the goals we discussed and components of chronic care management. Please contact me anytime with questions or concerns.   Visit Information  Patient Care Plan: CCM Pharmacy Care Plan    Problem Identified: CHL AMB "PATIENT-SPECIFIC PROBLEM"     Long-Range Goal: Disease Management   Start Date: 07/29/2020  Priority: High  Note:   Current Barriers:  . Unable to maintain control of diabetes  . Difficulty remembering to take mid-day and afternoon medications . Frequent shortness of breath and limited activity   Pharmacist Clinical Goal(s):  Marland Kitchen Patient will contact provider office for questions/concerns as evidenced notation of same in electronic health record through collaboration with PharmD and provider.   Interventions: . 1:1 collaboration with [name], MD regarding development and update of comprehensive plan of care as evidenced by provider attestation and co-signature . Inter-disciplinary care team collaboration (see longitudinal plan of care) . Comprehensive medication review performed; medication list updated in electronic medical record  Hypertension (BP goal < 140/90) Controlled - per clinic readings, however, pt concerned about side effects -Current treatment:  Propranolol 10 mg - 1 twice daily  -Medications previously tried: metoprolol (switched to propranolol due to tremors 10/2019) -Pt states the propranolol makes her very drowsy. -Reviewed other sleep inducing medications: ropinirole (morning dose) -Plan: Review chart for medication history and reisit concern at follow up visit.  Hyperlipidemia: (LDL goal < 100) -Not ideally controlled TG 460 2021, LDL inaccurate due to elevated TG Risk factors - diabetes  -Current treatment: . None -Medications previously tried: Multiple statins (atorvastatin, others) - leg  weakness -Educated on Cholesterol goals;  -Recommended  weight loss and diabetes control to reduce cardiovascular risk  Diabetes (A1c goal <7%) -Uncontrolled - A1c 9.9% -Current medications: . Trulicity 1.5 mg - Inject once weekly on Thursdays (she took 3rd dose this week) . Tyler Aas - 90 units daily  -Medications previously tried: none  -She was on prednisone end of April 2022. Reports BG was high for several weeks and leveled back out around 5/12. -Current home glucose readings - checking daily in the morning  . fasting glucose:  . 07/18/20 - 134 . 07/19/20 - 139 . 07/20/20 - 115 . 07/21/20 - 124 . 07/24/20 - 175 . 07/26/20 - 190 . 07/27/20 - 148 . 07/28/20 - 139 . 07/29/20 - 143 . post prandial glucose: none  -Denies hypoglycemic/hyperglycemic symptoms, denies any BG levels < 70 -Current meal patterns: The patient reports she is on a salt free, sugar free, gluten free diet but had a hard time sticking to this. She tries to limit portions.  -Educated on A1c and blood sugar goals; -Counseled to check feet daily and get yearly eye exams - annual eye and foot exam up to date  -Recommended to continue current medication; Follow up in 4 weeks to consider Trulicity dose increase.  Heart Failure (Goal: manage symptoms and prevent exacerbations) -Controlled - stable, no exacerbations in past 12 months -Last ejection fraction: 70% normal (Date: 03/21) -HF type: Diastolic -NYHA Class: II (slight limitation of activity) -AHA HF Stage: C (Heart disease and symptoms present) -Current treatment: . Spironolactone 25 mg - 1 tablet daily in morning  . Furosemide 20 mg - 3 tablets at breakfast and noon  . Potassium 10 mEq - 5 capsules at breakfast and noon . Metolazone - take 1 tablet  as needed for weight gain of 3 pounds in 24 hours or 5 pounds in 1 week -Medications previously tried: none  -Current home BP/HR readings: none reported -Educated on Importance of weighing daily; if you gain more than 3  pounds in one day or 5 pounds in one week, take metolazone Proper diuretic administration and potassium supplementation Importance of blood pressure control  We discussed: adherence - Pt endorses 80% compliance - 1-2 days per week misses evening dose of furosemide and potassium. She takes 8 potassium instead of 10 some days due to stomach feeling full. -Recommended to continue current medication; We discussed trying to remember to take second dose furosemide at 3 PM (3 tablets) and potassium at 5 PM at supper (5 tablets).  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) -Followed by pulmonology, last seen 05/2020 -Current treatment  Maintenance:  Pulmicort nebuilzer - twice daily  Yupelri nebuilzer-  Once daily in the morning before Pulmicort  Rescue:  Albuterol nebulizer - takes 2-3 times per day as needed -Medications previously tried: inhalers less effective, Perforomist was unaffordable  -Gold Grade: Gold 3 (FEV1 30-49%) -Current COPD Classification:  B (high sx, <2 exacerbations/yr) -MMRC/CAT score: not assessed -Pulmonary function testing: 2021 FEV1 % pred (post) 42% -Exacerbations requiring treatment in last 6 months: 01/2020 - required hospitalizations  -Patient reports consistent use of maintenance inhaler -Frequency of rescue inhaler use: 2-3 times daily  -Recommended to continue current medication  Depression/Anxiety/Pain(Goal: Control symptoms) -Controlled - per patient report -Current treatment: . Bupropion 300 mg - 1 tablet daily  . Cymbalta 60 mg - 1 in the evening (for mood and neuropathy) . Gabapentin 100 mg - 3 caps at bedtime  . Ropinirole 4 mg - 1 tablet twice daily for RLS  -Medications previously tried/failed: none -Recommended to continue current medication  Gout (Goal: No gout flares) Controlled - per patient report -Current treatment: Colchicine - 1 daily at first sign of flare -Recommended to continue current  medication  Patient Goals/Self-Care Activities . Patient will:  - focus on medication adherence by taking second dose of furosemide at 3 PM and potassium at 5 PM daily; avoid missed doses   Follow Up Plan: Telephone follow up appointment with care management team member scheduled for: 30 days      Kathleen Hatfield was given information about Chronic Care Management services today including:  1. CCM service includes personalized support from designated clinical staff supervised by her physician, including individualized plan of care and coordination with other care providers 2. 24/7 contact phone numbers for assistance for urgent and routine care needs. 3. Standard insurance, coinsurance, copays and deductibles apply for chronic care management only during months in which we provide at least 20 minutes of these services. Most insurances cover these services at 100%, however patients may be responsible for any copay, coinsurance and/or deductible if applicable. This service may help you avoid the need for more expensive face-to-face services. 4. Only one practitioner may furnish and bill the service in a calendar month. 5. The patient may stop CCM services at any time (effective at the end of the month) by phone call to the office staff.  Patient agreed to services and verbal consent obtained.   Patient verbalizes understanding of instructions provided today and agrees to view in [place].   Debbora Dus, PharmD Clinical Pharmacist [facility] [phone]   Basics of Medicine Management Taking your medicines correctly is an important part of managing or preventing  medical problems. Make sure you know what disease or condition your medicine is treating, and how and when to take it. If you do not take your medicine correctly, it may not work well and may cause unpleasant side effects, including serious health problems. What should I do when I am taking medicines?  Read  all the labels and inserts that come with your medicines. Review the information often.  Talk with your pharmacist if you get a refill and notice a change in the size, color, or shape of your medicines.  Know the potential side effects for each medicine that you take.  Try to get all your medicines from the same pharmacy. The pharmacist will have all your information and will understand how your medicines will affect each other (interact).  Tell your health care provider about all your medicines, including over-the-counter medicines, vitamins, and herbal or dietary supplements. He or she will make sure that nothing will interact with any of your prescribed medicines.   How can I take my medicines safely?  Take medicines only as told by your health care provider. ? Do not take more of your medicine than instructed. ? Do not take anyone else's medicines. ? Do not share your medicines with others. ? Do not stop taking your medicines unless your health care provider tells you to do so. ? You may need to avoid alcohol or certain foods or liquids when taking certain medicines. Follow your health care provider's instructions.  Do not split, mash, or chew your medicines unless your health care provider tells you to do so. Tell your health care provider if you have trouble swallowing your medicines.  For liquid medicine, use the dosing container that was provided. How should I organize my medicines? Know your medicines  Know what each of your medicines looks like. This includes size, color, and shape. Tell your health care provider if you are having trouble recognizing all the medicines that you are taking.  If you cannot tell your medicines apart because they look similar, keep them in original bottles.  If you cannot read the labels on the bottles, tell your pharmacist to put your medicines in containers with large print.  Review your medicines and your schedule with family members, a friend,  or a caregiver. Use a pill organizer  Use a tool to organize your medicine schedule. Tools include a weekly pillbox, a written chart, a notebook, or a calendar.  Your tool should help you remember the following things about each medicine: ? The name of the medicine. ? The amount (dose) to take. ? The schedule. This is the day and time the medicine should be taken. ? The appearance. This includes color, shape, size, and stamp. ? How to take your medicines. This includes instructions to take them with food, without food, with fluids, or with other medicines.  Create reminders for taking your medicines. Use sticky notes, or alarms on your watch, mobile device, or phone calendar.  You may choose to use a more advanced management system. These systems have storage, alarms, and visual and audio prompts.  Some medicines can be taken on an "as-needed" basis. These include medicines for nausea or pain. If you take an as-needed medicine, write down the name and dose, as well as the date and time that you took it.   How should I plan for travel?  Take your pillbox, medicines, and organization system with you when traveling.  Have your medicines refilled before you travel. This will  ensure that you do not run out of your medicines while you are away from home.  Always carry an updated list of your medicines with you. If there is an emergency, a first responder can quickly see what medicines you are taking.  Do not pack your medicines in checked luggage in case your luggage is lost or delayed.  If any of your medicines is considered a controlled substance, make sure you bring a letter from your health care provider with you. How should I store and discard my medicines? For safe storage:  Store medicines in a cool, dry area away from light, or as directed by your health care provider. Do not store medicines in the bathroom. Heat and humidity will affect them.  Do not store your medicines with  other chemicals, or with medicines for pets or other household members.  Keep medicines away from children and pets. Do not leave them on counters or bedside tables. Store them in high cabinets or on high shelves. For safe disposal:  Check expiration dates regularly. Do not take expired medicines. Discard medicines that are older than the expiration date.  Learn a safe way to dispose of your medicines. You may: ? Use a local government, hospital, or pharmacy medicine-take-back program. ? Mix the medicines with inedible substances, put them in a sealed bag or empty container, and throw them in the trash. What should I remember?  Tell your health care provider if you: ? Experience side effects. ? Have new symptoms. ? Have other concerns about taking your medicines.  Review your medicines regularly with your health care provider. Other medicines, diet, medical conditions, weight changes, and daily habits can all affect how medicines work. Ask if you need to continue taking each medicine, and discuss how well each one is working.  Refill your medicines early to avoid running out of them.  In case of an accidental overdose, call your local [place] at [phone] or visit your local emergency department immediately. This is important. Summary  Taking your medicines correctly is an important part of managing or preventing medical problems.  You need to make sure that you understand what you are taking a medicine for, as well as how and when you need to take it.  Know your medicines and use a pill organizer to help you take your medicines correctly.  In case of an accidental overdose, call your local [place] at [phone] or visit your local emergency department immediately. This is important. This information is not intended to replace advice given to you by your health care provider. Make sure you discuss any questions you have with your health care  provider. Document Revised: 02/18/2017 Document Reviewed: 02/18/2017 Elsevier Patient Education  2021 [employer].

## 2020-08-06 NOTE — Chronic Care Management (AMB) (Signed)
  Chronic Care Management   Note  08/06/2020 Name: Kathleen Hatfield MRN: [id number] DOB: 09/10/1955  Kathleen Hatfield is a 65 y.o. year old female who is a primary care patient of [name], MD. I reached out to Eveline Keto by phone today in response to a referral sent by [name]'s PCP, Dr. [name].      Kathleen Hatfield was given information about Chronic Care Management services today including:  1. CCM service includes personalized support from designated clinical staff supervised by her physician, including individualized plan of care and coordination with other care providers 2. 24/7 contact phone numbers for assistance for urgent and routine care needs. 3. Service will only be billed when office clinical staff spend 20 minutes or more in a month to coordinate care. 4. Only one practitioner may furnish and bill the service in a calendar month. 5. The patient may stop CCM services at any time (effective at the end of the month) by phone call to the office staff. 6. The patient will be responsible for cost sharing (co-pay) of up to 20% of the service fee (after annual deductible is met).  Patient agreed to services and verbal consent obtained.   Follow up plan: Telephone appointment with care management team member scheduled for:08/12/2020  [place] [facility]

## 2020-08-12 ENCOUNTER — Other Ambulatory Visit: Payer: Self-pay | Admitting: Family Medicine

## 2020-08-12 ENCOUNTER — Ambulatory Visit (INDEPENDENT_AMBULATORY_CARE_PROVIDER_SITE_OTHER): Payer: Medicare HMO

## 2020-08-12 DIAGNOSIS — I5032 Chronic diastolic (congestive) heart failure: Secondary | ICD-10-CM | POA: Diagnosis not present

## 2020-08-12 DIAGNOSIS — I10 Essential (primary) hypertension: Secondary | ICD-10-CM

## 2020-08-12 DIAGNOSIS — E119 Type 2 diabetes mellitus without complications: Secondary | ICD-10-CM | POA: Diagnosis not present

## 2020-08-12 DIAGNOSIS — E782 Mixed hyperlipidemia: Secondary | ICD-10-CM

## 2020-08-12 DIAGNOSIS — J449 Chronic obstructive pulmonary disease, unspecified: Secondary | ICD-10-CM

## 2020-08-12 NOTE — Chronic Care Management (AMB) (Signed)
Chronic Care Management   CCM RN Visit Note  08/13/2020 Name: Kathleen Hatfield MRN: [id number] DOB: [birth year]  Subjective: Kathleen Hatfield is a 65 y.o. year old female who is a primary care patient of [name], MD. The care management team was consulted for assistance with disease management and care coordination needs.    Engaged with patient by telephone for initial visit in response to provider referral for case management and/or care coordination services.   Consent to Services:  The patient was given the following information about Chronic Care Management services today, agreed to services, and gave verbal consent: 1. CCM service includes personalized support from designated clinical staff supervised by the primary care provider, including individualized plan of care and coordination with other care providers 2. 24/7 contact phone numbers for assistance for urgent and routine care needs. 3. Service will only be billed when office clinical staff spend 20 minutes or more in a month to coordinate care. 4. Only one practitioner may furnish and bill the service in a calendar month. 5.The patient may stop CCM services at any time (effective at the end of the month) by phone call to the office staff. 6. The patient will be responsible for cost sharing (co-pay) of up to 20% of the service fee (after annual deductible is met). Patient agreed to services and consent obtained.  Patient agreed to services and verbal consent obtained.   Assessment: Review of patient past medical history, allergies, medications, health status, including review of consultants reports, laboratory and other test data, was performed as part of comprehensive evaluation and provision of chronic care management services.   SDOH (Social Determinants of Health) assessments and interventions performed:  SDOH Interventions   Flowsheet Row Most Recent Value  SDOH Interventions   Food Insecurity Interventions Intervention Not  Indicated  Physical Activity Interventions Other (Comments)  [Discussed  chair exercises.  Exercise booklet mailed to patient.]  Transportation Interventions Intervention Not Indicated  Depression Interventions/Treatment  Medication       CCM Care Plan  Allergies  Allergen Reactions  . Almond Oil Anaphylaxis, Shortness Of Breath and Swelling  . Morphine And Related Shortness Of Breath and Other (See Comments)    Pt. States while in the hospital it affected her breathing, O2 dropped to the 70's  . Atorvastatin Other (See Comments)    Leg weakness  . Ceclor [Cefaclor] Nausea And Vomiting  . Ciprofloxacin Other (See Comments)    Makes joints and muscles ache  . Levaquin [Levofloxacin] Other (See Comments)    Body aches  . Losartan Other (See Comments)    Weakness   . Statins Other (See Comments)    Leg and body weakness  . Sulfa Antibiotics Nausea And Vomiting    Outpatient Encounter Medications as of 08/12/2020  Medication Sig Note  . acetaminophen (TYLENOL) 500 MG tablet Take 1,000 mg by mouth every 6 (six) hours as needed for pain.   Marland Kitchen albuterol (VENTOLIN HFA) 108 (90 Base) MCG/ACT inhaler Inhale 2 puffs into the lungs every 6 (six) hours as needed.   . budesonide (PULMICORT) 0.5 MG/2ML nebulizer solution Take 2 mLs (0.5 mg total) by nebulization 2 (two) times daily.   Marland Kitchen buPROPion (WELLBUTRIN XL) 300 MG 24 hr tablet TAKE 1 TABLET BY MOUTH DAILY   . Cholecalciferol (VITAMIN D-3) 5000 UNITS TABS Take 5,000 Units by mouth every other day.    . colchicine 0.6 MG tablet Take 1 tablet (0.6 mg total) by mouth daily as needed (  for gout).   . dicyclomine (BENTYL) 20 MG tablet TAKE 1 TABLET UP TO FOUR TIMES A DAY FOR GI CRAMPING, PAIN, AND NAUSEA/VOMITING AS NEEDED   . Dulaglutide (TRULICITY) 1.5 MG/0.5ML SOPN Inject 1.5 mg into the skin once a week.   . DULoxetine (CYMBALTA) 60 MG capsule TAKE 1 CAPSULE BY MOUTH ONCE DAILY.   . furosemide (LASIX) 20 MG tablet Take 3 tablets (60 mg total)  by mouth 2 (two) times daily.   . gabapentin (NEURONTIN) 100 MG capsule TAKE 3 CAPSULES BY MOUTH AT BEDTIME.   . ipratropium-albuterol (DUONEB) 0.5-2.5 (3) MG/3ML SOLN Take 3 mLs by nebulization See admin instructions. Nebulize 3 ml's and inhale into the lungs two to three times a day   . meloxicam (MOBIC) 15 MG tablet Take 1 tablet (15 mg total) by mouth daily.   . mesalamine (LIALDA) 1.2 g EC tablet Take 1.2 g by mouth daily with breakfast. Take 1.2 grams by mouth once a day with breakfast- increase as directed 08/12/2020: Patient states she takes 1 in the am and 1 in pm.   . metolazone (ZAROXOLYN) 5 MG tablet Take 1 tablet (5 mg total) by mouth as needed.   . ondansetron (ZOFRAN) 4 MG tablet Take 1 tablet (4 mg total) by mouth every 6 (six) hours.   . pantoprazole (PROTONIX) 40 MG tablet TAKE 1 TABLET BY MOUTH AT BEDTIME   . polyethylene glycol (MIRALAX / GLYCOLAX) packet Take 17 g by mouth daily as needed for mild constipation (MIX AND DRINK AS DIRECTED).    . potassium chloride (MICRO-K) 10 MEQ CR capsule TAKE 5 CAPSULES (50 MEQ TOTAL) BY MOUTH 2 (TWO) TIMES DAILY.   . propranolol (INDERAL) 10 MG tablet TAKE 1 TABLET BY MOUTH 2 TIMES DAILY   . revefenacin (YUPELRI) 175 MCG/3ML nebulizer solution Take 3 mLs (175 mcg total) by nebulization daily.   . rOPINIRole (REQUIP) 4 MG tablet TAKE 1 TABLET BY MOUTH TWICE DAILY   . spironolactone (ALDACTONE) 25 MG tablet TAKE 1 TABLET BY MOUTH DAILY.   . traMADol (ULTRAM) 50 MG tablet Take 1 tablet (50 mg total) by mouth 3 (three) times daily as needed.   . TRESIBA FLEXTOUCH 200 UNIT/ML FlexTouch Pen INJECT 88 UNITS UNDER SKIN ONCE DAILY 08/12/2020: Patient states she takes 90 units 1 time per day  . BAYER CONTOUR NEXT TEST test strip 1 each by Other route daily. As directed   . methylPREDNISolone (MEDROL DOSEPAK) 4 MG TBPK tablet 6 day dose pack - take as directed (Patient not taking: Reported on 07/29/2020)   . MICROLET LANCETS MISC 1 each by Other route. As  directed   . UNABLE TO FIND CPAP- At bedtime   . [DISCONTINUED] GLOBAL EASE INJECT PEN NEEDLES 32G X 4 MM MISC 1 each by Other route daily.     No facility-administered encounter medications on file as of 08/12/2020.    Patient Active Problem List   Diagnosis Date Noted  . Pain of foot 06/20/2020  . Medicare annual wellness visit, initial 06/20/2020  . Constipation 06/11/2020  . Diverticulosis of colon 06/11/2020  . Irritable bowel syndrome 06/11/2020  . Periumbilical pain 06/11/2020  . RLS (restless legs syndrome)   . Acute asthma exacerbation 01/09/2020  . Asthma exacerbation 01/08/2020  . Muscle weakness 01/08/2020  . Grade I diastolic dysfunction 01/08/2020  . Statin myopathy 10/10/2019  . Advance care planning 06/14/2019  . RLQ abdominal pain 06/14/2019  . COPD (chronic obstructive pulmonary disease) (HCC) 06/14/2019  .   Leukocytosis 06/14/2019  . Hypercalcemia 06/14/2019  . Gout 06/14/2019  . COPD with acute exacerbation (HCC) 05/03/2017  . Acute renal failure superimposed on stage 2 chronic kidney disease (HCC) 04/02/2016  . Diabetes mellitus without complication (HCC)   . CKD (chronic kidney disease)   . Hypersomnia with sleep apnea 09/16/2015  . Fatigue 06/07/2015  . Morbid obesity (HCC) 06/07/2015  . Chronic diastolic heart failure (HCC)   . OSA (obstructive sleep apnea) 08/23/2012  . Hypertension   . Depression   . GERD (gastroesophageal reflux disease)   . Hyperlipidemia   . Ulcerative colitis (HCC)   . Patellar tendinitis 05/25/2011  . Knee pain 05/25/2011  . Myofascial pain 05/25/2011  . Cervicalgia 05/25/2011    Conditions to be addressed/monitored:COPD and DMII  Care Plan : COPD (Adult)  Updates made by ,  E, RN since 08/13/2020 12:00 AM  Problem: Disease Progression (COPD )   Priority: High  Long-Range Goal: Disease progression minimized and managed   Start Date: 08/12/2020  Expected End Date: 11/06/2020  This Visit's Progress: On track   Priority: High  Current Barriers:  Patient with history of COPD with asthma and OSA who is followed by Dr. [name]. Patient reports she was hospitalized in November 2021 with asthma exacerbation/ COPD. Patient reports using inhalers as prescribed but states her nebulizer works better. Reports nebulizer usage at least 3 - 4 times per day. Patient reports she becomes short of breath walking short distances in her home.  Denies oxygen use. Patient states she checks her oxygen levels daily and reports level ranges at 93%. Patient states she requires assistance from her boyfriend who helps with meal preparation, house maintenance, shopping, and errands.  Patient reports she is independent with her ADL' but mostly takes sponge baths.  . Knowledge deficits related to basic understanding of COPD disease process and self care management Case Manager Clinical Goal(s):  patient will report using inhalers as prescribed including rinsing mouth after use  patient will report utilizing pursed lip breathing for shortness of breath  patient will verbalize understanding of COPD action plan and when to seek appropriate levels of medical care  patient will engage in lite exercise as tolerated to build/regain stamina and strength and reduce shortness of breath through activity tolerance  patient will verbalize basic understanding of COPD disease process and self care activities  patient will not be hospitalized for COPD exacerbation as evidenced by taking her medications as prescribed, reporting new or ongoing symptoms to her provider and keeping scheduled follow up visits with provider as recommended.  Interventions:  . Collaboration with Duncan, Graham S, MD regarding development and update of comprehensive plan of care as evidenced by provider attestation and co-signature . Inter-disciplinary care team collaboration (see longitudinal plan of care)  Provided patient with basic written and verbal COPD  education on self care/management/and exacerbation prevention   Provided patient with COPD action plan and reinforced importance of daily self assessment  Provided written and verbal instructions on pursed lip breathing and utilized returned demonstration as teach back  Provided instruction about proper use of medications used for management of COPD including inhalers  Advised patient to self assesses COPD action plan zone and make appointment with provider if in the yellow zone for 48 hours without improvement.  Advised patient to engage in light exercise as tolerated 3-5 days a week.  (exercise booklet sent to patient. Discussed chair exercises as an option)  Provided education about and advised patient to utilize infection   prevention strategies to reduce risk of respiratory infection  Self-Care Activities:  Self administers medications as prescribed Attends all scheduled provider appointments Calls pharmacy for medication refills Calls provider office for new concerns or questions Patient Goals: - Take your medications as prescribed and rinse mouth out after use on inhalers -Review education information on COPD management/ action plan sent to you in MyChart by your nurse case manager - Incorporate light exercise as tolerated at least 3 times per week. (Review exercise booklet sent to you in the mail for light chair exercises) - do breathing exercises every day - eliminate symptom triggers at home - keep follow-up appointments - avoid second hand smoke - listen for public air quality announcements every day -Identify and avoid symptom triggers, such as second hand smoke, virus, weather change, emotional upset, heavy exercise,  and environmental allergens that could exacerbate your COPD or asthma.  - Continue to wear your CPAP machine nightly as advised by your doctor.  Follow Up Plan: The patient has been provided with contact information for the care management team and has been  advised to call with any health related questions or concerns.  The care management team will reach out to the patient again over the next 30 days.     Care Plan : Diabetes Type 2 (Adult)  Updates made by ,  E, RN since 08/13/2020 12:00 AM  Problem: Glycemic Management (Diabetes, Type 2)   Priority: High  Long-Range Goal: Glycemic Management Optimized   Start Date: 08/12/2020  Expected End Date: 11/06/2020  This Visit's Progress: On track  Priority: High  Objective:  Lab Results  Component Value Date   HGBA1C 9.9 (H) 06/11/2020 .   Lab Results  Component Value Date   CREATININE 1.53 (H) 06/18/2020   CREATININE 1.75 (H) 06/11/2020   CREATININE 1.23 (H) 05/30/2020 .   Lab Results  Component Value Date   EGFR 49 (L) 05/30/2020   Current Barriers: Patient with Type 2 DM followed by primary care provider for management. Patient reports most recent Hgb A1c 9.9. She reports her blood sugars range from 112-170. She denies having any low blood sugars below 70. Patient reports checking her blood sugars daily in the am. She reports some improvement in her blood sugars due to making mindful changes to her diet. Patient reports she is unable to exercise at this time due to having bilateral knee pain. She reports it is difficult for her to walk because her knees are "bone on bone." Patient states she is unable to have knee replacement surgery until she loses weight.  Case Manager Clinical Goal(s):  . patient will demonstrate improved adherence to prescribed treatment plan for diabetes self care/management as evidenced by: daily monitoring and recording of CBG  adherence to ADA/ carb modified diet exercise 3 days/week adherence to prescribed medication regimen contacting provider for new or worsened symptoms or questions Interventions:  . Collaboration with Duncan, Graham S, MD regarding development and update of comprehensive plan of care as evidenced by provider attestation and  co-signature . Inter-disciplinary care team collaboration (see longitudinal plan of care) . Reviewed medications with patient and discussed importance of medication adherence . Discussed plans with patient for ongoing care management follow up and provided patient with direct contact information for care management team . Provided patient with written educational materials related to hypo and hyperglycemia and importance of correct treatment . Reviewed scheduled/upcoming provider appointments including . Provided patient with instructional exercise booklet that explains chair exercises.  Self-Care   Activities - Self administers oral medications as prescribed Self administers injectable DM medication as prescribed Attends all scheduled provider appointments Checks blood sugars as prescribed and utilize hyper and hypoglycemia protocol as needed Adheres to prescribed ADA/carb modified Patient Goals: - check blood sugar at prescribed times - check blood sugar if I feel it is too high or too low - take the blood sugar log to all doctor visits - drink 6 to 8 glasses of water each day - eat fish at least once per week - fill half of plate with vegetables (review diabetic diet information sent to you in MyChart) - manage portion size - prepare main meal at home 3 to 5 days each week - reduce red meat to 2 to 3 times a week - set a realistic goal ( set small achieving goals for weight lost, example 5 lbs in 1 month, meet that goal then set another attainable goal) - Incorporate exercise into your daily routine. (review exercise booklet regarding chair exercises sent to you in the mail) Follow Up Plan: The patient has been provided with contact information for the care management team and has been advised to call with any health related questions or concerns.  The care management team will reach out to the patient again over the next 30 days.        Plan:The patient has been provided with  contact information for the care management team and has been advised to call with any health related questions or concerns.  and The care management team will reach out to the patient again over the next 30 days. Quinn Plowman RN,BSN,CCM RN Case Manager [place]  [phone]

## 2020-08-13 NOTE — Patient Instructions (Signed)
Visit Information:  Thank you for taking the time to speak with me to day. Please review the below education articles for COPD action plan and Diabetes nutrition  PATIENT GOALS:  Goals Addressed            This Visit's Progress   . Monitor and Manage My Blood Sugar-Diabetes Type 2   On track    Timeframe:  Long-Range Goal Priority:  High Start Date:  08/12/2020                           Expected End Date: 11/06/2020                      Follow Up Date  09/12/2020   - check blood sugar at prescribed times - check blood sugar if I feel it is too high or too low - take the blood sugar log to all doctor visits - drink 6 to 8 glasses of water each day - eat fish at least once per week - fill half of plate with vegetables (review diabetic diet information sent to you in MyChart) - manage portion size - prepare main meal at home 3 to 5 days each week - reduce red meat to 2 to 3 times a week - set a realistic goal ( set small achieving goals for weight lost, example 5 lbs in 1 month, meet that goal then set another attainable goal) - Incorporate exercise into your daily routine. (review exercise booklet regarding chair exercises sent to you in the mail)   Why is this important?    Checking your blood sugar at home helps to keep it from getting very high or very low.   Writing the results in a diary or log helps the doctor know how to care for you.   Your blood sugar log should have the time, date and the results.   Also, write down the amount of insulin or other medicine that you take.   Other information, like what you ate, exercise done and how you were feeling, will also be helpful.       . Track and Manage My Symptoms-COPD   On track    Timeframe:  Long-Range Goal Priority:  High Start Date:    08/12/2020                         Expected End Date:  11/06/2020                     Follow Up Date 09/12/2020   - Take your medications as prescribed and rinse mouth out after use on  inhalers -Review education information on COPD management/ action plan sent to you in MyChart by your nurse case manager - Incorporate light exercise as tolerated at least 3 times per week. (Review exercise booklet sent to you in the mail for light chair exercises) - do breathing exercises every day - eliminate symptom triggers at home - keep follow-up appointments - avoid second hand smoke - listen for public air quality announcements every day -Identify and avoid symptom triggers, such as second hand smoke, virus, weather change, emotional upset, heavy exercise,  and environmental allergens that could exacerbate your COPD or asthma.  - Continue to wear your CPAP machine nightly as advised by your doctor.    Why is this important?    Tracking your symptoms and other information about  your health helps your doctor plan your care.   Write down the symptoms, the time of day, what you were doing and what medicine you are taking.   You will soon learn how to manage your symptoms.            COPD Action Plan A COPD action plan is a description of what to do when you have a flare (exacerbation) of chronic obstructive pulmonary disease (COPD). Your action plan is a color-coded plan that lists the symptoms that indicate whether your condition is under control and what actions to take.  If you have symptoms in the [place] zone, it means you are doing well that day.  If you have symptoms in the yellow zone, it means you are having a bad day or an exacerbation.  If you have symptoms in the red zone, you need urgent medical care. Follow the plan that you and your health care provider developed. Review your plan with your health care provider at each visit. Red zone Symptoms in this zone mean that you should get medical help right away. They include:  Feeling very short of breath, even when you are resting.  Not being able to do any activities because of poor breathing.  Not being able to  sleep because of poor breathing.  Fever or shaking chills.  Feeling confused or very sleepy.  Chest pain.  Coughing up blood. If you have any of these symptoms, call emergency services (911 in the [place] or go to the nearest emergency room.   Yellow zone Symptoms in this zone mean that your condition may be getting worse. They include:  Feeling more short of breath than usual.  Having less energy for daily activities than usual.  Phlegm or mucus that is thicker than usual.  Needing to use your rescue inhaler or nebulizer more often than usual.  More ankle swelling than usual.  Coughing more than usual.  Feeling like you have a chest cold.  Trouble sleeping due to COPD symptoms.  Decreased appetite.  COPD medicines not helping as much as usual. If you experience any "yellow" symptoms:  Keep taking your daily medicines as directed.  Use your quick-relief inhaler as told by your health care provider.  If you were prescribed steroid medicine to take by mouth (oral medicine), start taking it as told by your health care provider.  If you were prescribed an antibiotic medicine, start taking it as told by your health care provider. Do not stop taking the antibiotic even if you start to feel better.  Use oxygen as told by your health care provider.  Get more rest.  Do your pursed-lip breathing exercises.  Do not smoke. Avoid any irritants in the air. If your signs and symptoms do not improve after taking these steps, call your health care provider right away.   [place] zone Symptoms in this zone mean that you are doing well. They include:  Being able to do your usual activities and exercise.  Having the usual amount of coughing, including the same amount of phlegm or mucus.  Being able to sleep well.  Having a good appetite.   Where to find more information: You can find more information about COPD from:  American Lung Association, My COPD Action Plan:  www.lung.org  COPD Foundation: www.copdfoundation.[place]: https://wilson-eaton.com/ Follow these instructions at home:  Continue taking your daily medicines as told by your health care provider.  Make sure you receive all  the immunizations that your health care provider recommends, especially the pneumococcal and influenza vaccines.  Wash your hands often with soap and water. Have family members wash their hands too. Regular hand washing can help prevent infections.  Follow your usual exercise and diet plan.  Avoid irritants in the air, such as smoke.  Do not use any products that contain nicotine or tobacco. These products include cigarettes, chewing tobacco, and vaping devices, such as e-cigarettes. If you need help quitting, ask your health care provider. Summary  A COPD action plan tells you what to do when you have a flare (exacerbation) of chronic obstructive pulmonary disease (COPD).  Follow each action plan for your symptoms. If you have any symptoms in the red zone, call emergency services (911 in the [place] or go to the nearest emergency room. This information is not intended to replace advice given to you by your health care provider. Make sure you discuss any questions you have with your health care provider. Document Revised: 01/02/2020 Document Reviewed: 01/02/2020 Elsevier Patient Education  2021 [place].  Diabetes Mellitus and Nutrition, Adult When you have diabetes, or diabetes mellitus, it is very important to have healthy eating habits because your blood sugar (glucose) levels are greatly affected by what you eat and drink. Eating healthy foods in the right amounts, at about the same times every day, can help you:  Control your blood glucose.  Lower your risk of heart disease.  Improve your blood pressure.  Reach or maintain a healthy weight. What can affect my meal plan? Every person with diabetes is different, and each  person has different needs for a meal plan. Your health care provider may recommend that you work with a dietitian to make a meal plan that is best for you. Your meal plan may vary depending on factors such as:  The calories you need.  The medicines you take.  Your weight.  Your blood glucose, blood pressure, and cholesterol levels.  Your activity level.  Other health conditions you have, such as heart or kidney disease. How do carbohydrates affect me? Carbohydrates, also called carbs, affect your blood glucose level more than any other type of food. Eating carbs naturally raises the amount of glucose in your blood. Carb counting is a method for keeping track of how many carbs you eat. Counting carbs is important to keep your blood glucose at a healthy level, especially if you use insulin or take certain oral diabetes medicines. It is important to know how many carbs you can safely have in each meal. This is different for every person. Your dietitian can help you calculate how many carbs you should have at each meal and for each snack. How does alcohol affect me? Alcohol can cause a sudden decrease in blood glucose (hypoglycemia), especially if you use insulin or take certain oral diabetes medicines. Hypoglycemia can be a life-threatening condition. Symptoms of hypoglycemia, such as sleepiness, dizziness, and confusion, are similar to symptoms of having too much alcohol.  Do not drink alcohol if: ? Your health care provider tells you not to drink. ? You are pregnant, may be pregnant, or are planning to become pregnant.  If you drink alcohol: ? Do not drink on an empty stomach. ? Limit how much you use to:  0-1 drink a day for women.  0-2 drinks a day for men. ? Be aware of how much alcohol is in your drink. In the [place] one drink equals one 12 oz bottle of beer (  355 mL), one 5 oz glass of wine (148 mL), or one 1 oz glass of hard liquor (44 mL). ? Keep yourself hydrated with water,  diet soda, or unsweetened iced tea.  Keep in mind that regular soda, juice, and other mixers may contain a lot of sugar and must be counted as carbs. What are tips for following this plan? Reading food labels  Start by checking the serving size on the "Nutrition Facts" label of packaged foods and drinks. The amount of calories, carbs, fats, and other nutrients listed on the label is based on one serving of the item. Many items contain more than one serving per package.  Check the total grams (g) of carbs in one serving. You can calculate the number of servings of carbs in one serving by dividing the total carbs by 15. For example, if a food has 30 g of total carbs per serving, it would be equal to 2 servings of carbs.  Check the number of grams (g) of saturated fats and trans fats in one serving. Choose foods that have a low amount or none of these fats.  Check the number of milligrams (mg) of salt (sodium) in one serving. Most people should limit total sodium intake to less than 2,300 mg per day.  Always check the nutrition information of foods labeled as "low-fat" or "nonfat." These foods may be higher in added sugar or refined carbs and should be avoided.  Talk to your dietitian to identify your daily goals for nutrients listed on the label. Shopping  Avoid buying canned, pre-made, or processed foods. These foods tend to be high in fat, sodium, and added sugar.  Shop around the outside edge of the grocery store. This is where you will most often find fresh fruits and vegetables, bulk grains, fresh meats, and fresh dairy. Cooking  Use low-heat cooking methods, such as baking, instead of high-heat cooking methods like deep frying.  Cook using healthy oils, such as olive, canola, or sunflower oil.  Avoid cooking with butter, cream, or high-fat meats. Meal planning  Eat meals and snacks regularly, preferably at the same times every day. Avoid going long periods of time without  eating.  Eat foods that are high in fiber, such as fresh fruits, vegetables, beans, and whole grains. Talk with your dietitian about how many servings of carbs you can eat at each meal.  Eat 4-6 oz (112-168 g) of lean protein each day, such as lean meat, chicken, fish, eggs, or tofu. One ounce (oz) of lean protein is equal to: ? 1 oz (28 g) of meat, chicken, or fish. ? 1 egg. ?  cup (62 g) of tofu.  Eat some foods each day that contain healthy fats, such as avocado, nuts, seeds, and fish.   What foods should I eat? Fruits Berries. Apples. Oranges. Peaches. Apricots. Plums. Grapes. Mango. Papaya. Pomegranate. Kiwi. Cherries. Vegetables Lettuce. Spinach. Leafy greens, including kale, chard, collard greens, and mustard greens. Beets. Cauliflower. Cabbage. Broccoli. Carrots. Terence Bart beans. Tomatoes. Peppers. Onions. Cucumbers. Brussels sprouts. Grains Whole grains, such as whole-wheat or whole-grain bread, crackers, tortillas, cereal, and pasta. Unsweetened oatmeal. Quinoa. Brown or wild rice. Meats and other proteins Seafood. Poultry without skin. Lean cuts of poultry and beef. Tofu. Nuts. Seeds. Dairy Low-fat or fat-free dairy products such as milk, yogurt, and cheese. The items listed above may not be a complete list of foods and beverages you can eat. Contact a dietitian for more information. What foods should I avoid? Fruits  Fruits canned with syrup. Vegetables Canned vegetables. Frozen vegetables with butter or cream sauce. Grains Refined white flour and flour products such as bread, pasta, snack foods, and cereals. Avoid all processed foods. Meats and other proteins Fatty cuts of meat. Poultry with skin. Breaded or fried meats. Processed meat. Avoid saturated fats. Dairy Full-fat yogurt, cheese, or milk. Beverages Sweetened drinks, such as soda or iced tea. The items listed above may not be a complete list of foods and beverages you should avoid. Contact a dietitian for more  information. Questions to ask a health care provider  Do I need to meet with a diabetes educator?  Do I need to meet with a dietitian?  What number can I call if I have questions?  When are the best times to check my blood glucose? Where to find more information:  American Diabetes Association: diabetes.org  Academy of Nutrition and Dietetics: www.eatright.[employer] of Diabetes and Digestive and Kidney Diseases: [place].dk  Association of Diabetes Care and Education Specialists: www.diabeteseducator.org Summary  It is important to have healthy eating habits because your blood sugar (glucose) levels are greatly affected by what you eat and drink.  A healthy meal plan will help you control your blood glucose and maintain a healthy lifestyle.  Your health care provider may recommend that you work with a dietitian to make a meal plan that is best for you.  Keep in mind that carbohydrates (carbs) and alcohol have immediate effects on your blood glucose levels. It is important to count carbs and to use alcohol carefully. This information is not intended to replace advice given to you by your health care provider. Make sure you discuss any questions you have with your health care provider. Document Revised: 01/31/2019 Document Reviewed:   Consent to CCM Services: Ms. Donaldson was given information about Chronic Care Management services today including:  1. CCM service includes personalized support from designated clinical staff supervised by her physician, including individualized plan of care and coordination with other care providers 2. 24/7 contact phone numbers for assistance for urgent and routine care needs. 3. Service will only be billed when office clinical staff spend 20 minutes or more in a month to coordinate care. 4. Only one practitioner may furnish and bill the service in a calendar month. 5. The patient may stop CCM services at any time (effective at the  end of the month) by phone call to the office staff. 6. The patient will be responsible for cost sharing (co-pay) of up to 20% of the service fee (after annual deductible is met).  Patient agreed to services and verbal consent obtained.   Patient verbalizes understanding of instructions provided today and agrees to view in [place].   The patient has been provided with contact information for the care management team and has been advised to call with any health related questions or concerns.  The care management team will reach out to the patient again over the next 30 days.   Quinn Plowman RN,BSN,CCM RN Case Manager [place]  [phone]   CLINICAL CARE PLAN: Patient Care Plan: CCM Pharmacy Care Plan    Problem Identified: CHL AMB "PATIENT-SPECIFIC PROBLEM"     Long-Range Goal: Disease Management   Start Date: 07/29/2020  Priority: High  Note:   Current Barriers:  . Unable to maintain control of diabetes  . Difficulty remembering to take mid-day and afternoon medications . Frequent shortness of breath and limited activity   Pharmacist Clinical Goal(s):  .  Patient will contact provider office for questions/concerns as evidenced notation of same in electronic health record through collaboration with PharmD and provider.   Interventions: . 1:1 collaboration with [name], MD regarding development and update of comprehensive plan of care as evidenced by provider attestation and co-signature . Inter-disciplinary care team collaboration (see longitudinal plan of care) . Comprehensive medication review performed; medication list updated in electronic medical record  Hypertension (BP goal < 140/90) Controlled - per clinic readings, however, pt concerned about side effects -Current treatment:  Propranolol 10 mg - 1 twice daily  -Medications previously tried: metoprolol (switched to propranolol due to tremors 10/2019) -Pt states the propranolol makes her very  drowsy. -Reviewed other sleep inducing medications: ropinirole (morning dose) -Plan: Review chart for medication history and reisit concern at follow up visit.  Hyperlipidemia: (LDL goal < 100) -Not ideally controlled TG 460 2021, LDL inaccurate due to elevated TG Risk factors - diabetes  -Current treatment: . None -Medications previously tried: Multiple statins (atorvastatin, others) - leg weakness -Educated on Cholesterol goals;  -Recommended  weight loss and diabetes control to reduce cardiovascular risk  Diabetes (A1c goal <7%) -Uncontrolled - A1c 9.9% -Current medications: . Trulicity 1.5 mg - Inject once weekly on Thursdays (she took 3rd dose this week) . Tyler Aas - 90 units daily  -Medications previously tried: none  -She was on prednisone end of April 2022. Reports BG was high for several weeks and leveled back out around 5/12. -Current home glucose readings - checking daily in the morning  . fasting glucose:  . 07/18/20 - 134 . 07/19/20 - 139 . 07/20/20 - 115 . 07/21/20 - 124 . 07/24/20 - 175 . 07/26/20 - 190 . 07/27/20 - 148 . 07/28/20 - 139 . 07/29/20 - 143 . post prandial glucose: none  -Denies hypoglycemic/hyperglycemic symptoms, denies any BG levels < 70 -Current meal patterns: The patient reports she is on a salt free, sugar free, gluten free diet but had a hard time sticking to this. She tries to limit portions.  -Educated on A1c and blood sugar goals; -Counseled to check feet daily and get yearly eye exams - annual eye and foot exam up to date  -Recommended to continue current medication; Follow up in 4 weeks to consider Trulicity dose increase.  Heart Failure (Goal: manage symptoms and prevent exacerbations) -Controlled - stable, no exacerbations in past 12 months -Last ejection fraction: 70% normal (Date: 03/21) -HF type: Diastolic -NYHA Class: II (slight limitation of activity) -AHA HF Stage: C (Heart disease and symptoms present) -Current  treatment: . Spironolactone 25 mg - 1 tablet daily in morning  . Furosemide 20 mg - 3 tablets at breakfast and noon  . Potassium 10 mEq - 5 capsules at breakfast and noon . Metolazone - take 1 tablet as needed for weight gain of 3 pounds in 24 hours or 5 pounds in 1 week -Medications previously tried: none  -Current home BP/HR readings: none reported -Educated on Importance of weighing daily; if you gain more than 3 pounds in one day or 5 pounds in one week, take metolazone Proper diuretic administration and potassium supplementation Importance of blood pressure control  We discussed: adherence - Pt endorses 80% compliance - 1-2 days per week misses evening dose of furosemide and potassium. She takes 8 potassium instead of 10 some days due to stomach feeling full. -Recommended to continue current medication; We discussed trying to remember to take second dose furosemide at 3 PM (3 tablets) and potassium at  5 PM at supper (5 tablets).  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) -Followed by pulmonology, last seen 05/2020 -Current treatment  Maintenance:  Pulmicort nebuilzer - twice daily  Yupelri nebuilzer-  Once daily in the morning before Pulmicort  Rescue:  Albuterol nebulizer - takes 2-3 times per day as needed -Medications previously tried: inhalers less effective, Perforomist was unaffordable  -Gold Grade: Gold 3 (FEV1 30-49%) -Current COPD Classification:  B (high sx, <2 exacerbations/yr) -MMRC/CAT score: not assessed -Pulmonary function testing: 2021 FEV1 % pred (post) 42% -Exacerbations requiring treatment in last 6 months: 01/2020 - required hospitalizations  -Patient reports consistent use of maintenance inhaler -Frequency of rescue inhaler use: 2-3 times daily  -Recommended to continue current medication  Depression/Anxiety/Pain(Goal: Control symptoms) -Controlled - per patient report -Current treatment: . Bupropion 300 mg - 1 tablet daily   . Cymbalta 60 mg - 1 in the evening (for mood and neuropathy) . Gabapentin 100 mg - 3 caps at bedtime  . Ropinirole 4 mg - 1 tablet twice daily for RLS  -Medications previously tried/failed: none -Recommended to continue current medication  Gout (Goal: No gout flares) Controlled - per patient report -Current treatment: Colchicine - 1 daily at first sign of flare -Recommended to continue current medication  Patient Goals/Self-Care Activities . Patient will:  - focus on medication adherence by taking second dose of furosemide at 3 PM and potassium at 5 PM daily; avoid missed doses   Follow Up Plan: Telephone follow up appointment with care management team member scheduled for: 30 days    Patient Care Plan: COPD (Adult)    Problem Identified: Symptom Exacerbation (COPD) Resolved 08/12/2020    Goal: Symptom Exacerbation Prevented or Minimized Completed 08/12/2020  Note:   Evidence-based guidance:   Monitor for signs of respiratory infection, including changes in sputum color, volume and thickness, as well as fever.   Encourage infection prevention strategies that may include prophylactic antibiotic therapy for patients with history of frequent exacerbations or antibiotic administration during exacerbation based on presentation, risk and benefit.   Encourage receipt of influenza and pneumococcal vaccine.   Prepare patient for use of home long-term oxygen therapy in presence of sever resting hypoxemia.   Prepare patients for laboratory studies or diagnostic exams, such as spirometry, pulse oximetry and arterial blood gas based on current symptoms, risk factors and presentation.   Assess barriers and manage adherence, including inhaler technique and persistent trigger exposure; encourage adherence, even when symptoms are controlled or infrequent.   Assess and monitor for signs/symptoms of psychosocial concerns, such as shortness of breath-anxiety cycle or depression that may impact  stability of symptoms.   Identify economic resources, sociocultural beliefs, social factors and health literacy that may interfere with adherence.   Promote lifestyle changes when needed, including regular physical activity based on tolerance, weight loss, healthy eating and stress management.   Consider referral to nurse or community health worker or home-visiting program for intensive support and education (disease-management program).   Increase frequency of follow-up following exacerbation or hospitalization; consider transition of care interventions, such as hospital visit, home visit, telephone follow-up, review of discharge summary and resource referrals.   Notes:    Problem Identified: Disease Progression (COPD )   Priority: High    Long-Range Goal: Disease progression minimized and managed   Start Date: 08/12/2020  Expected End Date: 11/06/2020  This Visit's Progress: On track  Priority: High  Note:   Current Barriers:  Patient with history of COPD  with asthma and OSA who is followed by Dr. [name]. Patient reports she was hospitalized in November 2021 with asthma exacerbation/ COPD. Patient reports using inhalers as prescribed but states her nebulizer works better. Reports nebulizer usage at least 3 - 4 times per day. Patient reports she becomes short of breath walking short distances in her home.  Denies oxygen use. Patient states she checks her oxygen levels daily and reports level ranges at 93%. Patient states she requires assistance from her boyfriend who helps with meal preparation, house maintenance, shopping, and errands.  Patient reports she is independent with her ADL' but mostly takes sponge baths.  . Knowledge deficits related to basic understanding of COPD disease process and self care management Case Manager Clinical Goal(s):  patient will report using inhalers as prescribed including rinsing mouth after use  patient will report utilizing pursed lip breathing for  shortness of breath  patient will verbalize understanding of COPD action plan and when to seek appropriate levels of medical care  patient will engage in lite exercise as tolerated to build/regain stamina and strength and reduce shortness of breath through activity tolerance  patient will verbalize basic understanding of COPD disease process and self care activities  patient will not be hospitalized for COPD exacerbation as evidenced by taking her medications as prescribed, reporting new or ongoing symptoms to her provider and keeping scheduled follow up visits with provider as recommended.  Interventions:  . Collaboration with [name], MD regarding development and update of comprehensive plan of care as evidenced by provider attestation and co-signature . Inter-disciplinary care team collaboration (see longitudinal plan of care)  Provided patient with basic written and verbal COPD education on self care/management/and exacerbation prevention   Provided patient with COPD action plan and reinforced importance of daily self assessment  Provided written and verbal instructions on pursed lip breathing and utilized returned demonstration as teach back  Provided instruction about proper use of medications used for management of COPD including inhalers  Advised patient to self assesses COPD action plan zone and make appointment with provider if in the yellow zone for 48 hours without improvement.  Advised patient to engage in light exercise as tolerated 3-5 days a week.  (exercise booklet sent to patient. Discussed chair exercises as an option)  Provided education about and advised patient to utilize infection prevention strategies to reduce risk of respiratory infection  Self-Care Activities:  Self administers medications as prescribed Attends all scheduled provider appointments Calls pharmacy for medication refills Calls provider office for new concerns or questions Patient  Goals: - Take your medications as prescribed and rinse mouth out after use on inhalers -Review education information on COPD management/ action plan sent to you in MyChart by your nurse case manager - Incorporate light exercise as tolerated at least 3 times per week. (Review exercise booklet sent to you in the mail for light chair exercises) - do breathing exercises every day - eliminate symptom triggers at home - keep follow-up appointments - avoid second hand smoke - listen for public air quality announcements every day -Identify and avoid symptom triggers, such as second hand smoke, virus, weather change, emotional upset, heavy exercise,  and environmental allergens that could exacerbate your COPD or asthma.  - Continue to wear your CPAP machine nightly as advised by your doctor.  Follow Up Plan: The patient has been provided with contact information for the care management team and has been advised to call with any health related questions or concerns.  The care management team will reach out to the patient again over the next 30 days.           Patient Care Plan: Diabetes Type 2 (Adult)    Problem Identified: Glycemic Management (Diabetes, Type 2)   Priority: High    Long-Range Goal: Glycemic Management Optimized   Start Date: 08/12/2020  Expected End Date: 11/06/2020  This Visit's Progress: On track  Priority: High  Note:   Objective:  Lab Results  Component Value Date   HGBA1C 9.9 (H) 06/11/2020 .   Lab Results  Component Value Date   CREATININE 1.53 (H) 06/18/2020   CREATININE 1.75 (H) 06/11/2020   CREATININE 1.23 (H) 05/30/2020 .   Lab Results  Component Value Date   EGFR 49 (L) 05/30/2020   Current Barriers: Patient with Type 2 DM followed by primary care provider for management. Patient reports most recent Hgb A1c 9.9. She reports her blood sugars range from 112-170. She denies having any low blood sugars below 70. Patient reports checking her blood sugars daily  in the am. She reports some improvement in her blood sugars due to making mindful changes to her diet. Patient reports she is unable to exercise at this time due to having bilateral knee pain. She reports it is difficult for her to walk because her knees are "bone on bone." Patient states she is unable to have knee replacement surgery until she loses weight.  Case Manager Clinical Goal(s):  . patient will demonstrate improved adherence to prescribed treatment plan for diabetes self care/management as evidenced by: daily monitoring and recording of CBG  adherence to ADA/ carb modified diet exercise 3 days/week adherence to prescribed medication regimen contacting provider for new or worsened symptoms or questions Interventions:  . Collaboration with [name], MD regarding development and update of comprehensive plan of care as evidenced by provider attestation and co-signature . Inter-disciplinary care team collaboration (see longitudinal plan of care) . Reviewed medications with patient and discussed importance of medication adherence . Discussed plans with patient for ongoing care management follow up and provided patient with direct contact information for care management team . Provided patient with written educational materials related to hypo and hyperglycemia and importance of correct treatment . Reviewed scheduled/upcoming provider appointments including . Provided patient with instructional exercise booklet that explains chair exercises.  Self-Care Activities - Self administers oral medications as prescribed Self administers injectable DM medication as prescribed Attends all scheduled provider appointments Checks blood sugars as prescribed and utilize hyper and hypoglycemia protocol as needed Adheres to prescribed ADA/carb modified Patient Goals: - check blood sugar at prescribed times - check blood sugar if I feel it is too high or too low - take the blood sugar log to all doctor  visits - drink 6 to 8 glasses of water each day - eat fish at least once per week - fill half of plate with vegetables (review diabetic diet information sent to you in MyChart) - manage portion size - prepare main meal at home 3 to 5 days each week - reduce red meat to 2 to 3 times a week - set a realistic goal ( set small achieving goals for weight lost, example 5 lbs in 1 month, meet that goal then set another attainable goal) - Incorporate exercise into your daily routine. (review exercise booklet regarding chair exercises sent to you in the mail) Follow Up Plan: The patient has been provided with contact information for the care management team and  has been advised to call with any health related questions or concerns.  The care management team will reach out to the patient again over the next 30 days.            01/31/2019 Elsevier Patient Education  2021 [employer].

## 2020-08-15 ENCOUNTER — Other Ambulatory Visit: Payer: Self-pay | Admitting: Family Medicine

## 2020-08-15 DIAGNOSIS — E119 Type 2 diabetes mellitus without complications: Secondary | ICD-10-CM

## 2020-08-15 DIAGNOSIS — M109 Gout, unspecified: Secondary | ICD-10-CM

## 2020-08-15 MED ORDER — COLCHICINE 0.6 MG PO TABS: 0.6000 mg | ORAL_TABLET | Freq: Every day | ORAL | 1 refills | Status: DC | PRN

## 2020-08-15 NOTE — Telephone Encounter (Signed)
  LAST APPOINTMENT DATE: 08/12/2020   NEXT APPOINTMENT DATE:@6 /27/2022  MEDICATION: Colchicine  PHARMACY: [facility]  Let patient know to contact pharmacy at the end of the day to make sure medication is ready.  Please notify patient to allow 48-72 hours to process  Encourage patient to contact the pharmacy for refills or they can request refills through [place]:   LAST REFILL:  QTY:  REFILL DATE:    OTHER COMMENTS:    Okay for refill?  Please advise

## 2020-08-15 NOTE — Telephone Encounter (Signed)
I sent the rx for colchicine.  Please let her know.  I'll check on other options in the meantime and we'll be in touch.  Thanks.

## 2020-08-15 NOTE — Telephone Encounter (Signed)
Pt called triage line and left message. I called her back to verify as she had a rx written 05-11-20 #30 and 1 refill 1 daily as needed. She said that she has pretty much had gout in her foot for the last few months. She had been taking an average of 1 colchicine every other day then pretty much daily the last month. She received the 1st rx on 05-11-20 then the refill rx on 06-05-20. Within the last 2-3 days, she is having a flare in her left foot and was taking 2 daily. She would like the colchicine refilled and possibly a daily preventative. Asked that it be sent to [facility]. Please call pt at [phone] with any questions.

## 2020-08-16 NOTE — Telephone Encounter (Signed)
Patient called and informed that there is an Rx in the pharmacy for her to pick up. Patient stated that she is going to see Dr. Amalia Hailey soon who is a podiatrist. Patient stated her appreciation for the call.

## 2020-08-17 ENCOUNTER — Other Ambulatory Visit: Payer: Self-pay | Admitting: Family Medicine

## 2020-08-18 MED ORDER — ALLOPURINOL 100 MG PO TABS: 50.0000 mg | ORAL_TABLET | Freq: Every day | ORAL | 0 refills | Status: DC

## 2020-08-18 NOTE — Telephone Encounter (Addendum)
I would start taking allopurinol 50 mg a day.  That would be half of 100 mg tablet.  Prescription sent.  I want to recheck her labs at/before an office visit in July.  If she has more trouble on the medication in the meantime then stop and let me know.  Thanks.

## 2020-08-18 NOTE — Addendum Note (Signed)
Addended by: [name] on: 08/18/2020 07:23 PM   Modules accepted: Orders

## 2020-08-20 ENCOUNTER — Ambulatory Visit (INDEPENDENT_AMBULATORY_CARE_PROVIDER_SITE_OTHER): Payer: Medicare HMO | Admitting: Podiatry

## 2020-08-20 ENCOUNTER — Other Ambulatory Visit: Payer: Self-pay

## 2020-08-20 ENCOUNTER — Encounter: Payer: Self-pay | Admitting: Podiatry

## 2020-08-20 DIAGNOSIS — M25871 Other specified joint disorders, right ankle and foot: Secondary | ICD-10-CM | POA: Diagnosis not present

## 2020-08-20 DIAGNOSIS — M659 Synovitis and tenosynovitis, unspecified: Secondary | ICD-10-CM | POA: Diagnosis not present

## 2020-08-20 MED ORDER — BETAMETHASONE SOD PHOS & ACET 6 (3-3) MG/ML IJ SUSP
3.0000 mg | Freq: Once | INTRAMUSCULAR | Status: AC
  Administered 2020-08-20: 3 mg via INTRA_ARTICULAR

## 2020-08-20 NOTE — Progress Notes (Signed)
   Subjective:  65 y.o. female presenting today for follow-up evaluation of right foot and ankle pain is been going on for about 6 months now.  Patient continues to have pain and tenderness associated to the foot.  She states that she did get better with the last injection she received back in April as well as wearing the cam boot.  The pain is slowly recurred.  She presents for further treatment and evaluation  Past Medical History:  Diagnosis Date   Anemia    Arthritis    Asthma    Chronic diastolic CHF 62/3762   Echocardiogram 05/2019: EF 70, no RWMA, mild LVH, Gr 1 DD, normal RVSF, severe LVH, borderline asc Aorta (39 mm)   CKD (chronic kidney disease)    Depression    Diabetes mellitus without complication (HCC)    Diverticulitis    Dyspnea    with exertion   GERD (gastroesophageal reflux disease)    History of blood transfusion    Hyperlipidemia    cannot tolerate statins   Hypertension    patient states she has never had high blood pressure.    Nuclear stress test    Myoview 05/2019: EF 83, no ischemia or infarction; Low Risk   Peripheral neuropathy    Pneumonia    PONV (postoperative nausea and vomiting)    RLS (restless legs syndrome)    Sleep apnea    uses CPAP   Ulcerative colitis ([place])    dr Collene Mares     Objective / Physical Exam:  General:  The patient is alert and oriented x3 in no acute distress. Dermatology:  Skin is warm, dry and supple bilateral lower extremities. Negative for open lesions or macerations. Vascular:  Palpable pedal pulses bilaterally. No edema or erythema noted. Capillary refill within normal limits. Neurological:  Epicritic and protective threshold grossly intact bilaterally.  Musculoskeletal Exam:  Pain on palpation to the anterior lateral medial aspects of the patient's right ankle. Mild edema noted.  Pain on palpation also noted to the tibial sesamoid right first MTPJ range of motion within normal limits to all pedal and ankle joints  bilateral. Muscle strength 5/5 in all groups bilateral.    Assessment: 1.  Capsulitis/synovitis right ankle 2.  Sesamoiditis right first MTPJ  Plan of Care:  1. Patient was evaluated. X-Rays reviewed.  2. Injection of 0.5 mL Celestone Soluspan injected in the patient's right ankle and sesamoidal apparatus right 3.  Once the patient completes the colchicine that was prescribed from the PCP resume meloxicam 15 mg daily 4.  Compression ankle sleeve dispensed for daily 5.  Continue Dr. Gibson Ramp stability walking sneakers 6.  Return to clinic in 6 weeks   Edrick Kins, DPM [facility]  Dr. Edrick Kins, DPM    [address], [place] [address]                Office [phone]  Fax [phone]

## 2020-08-21 ENCOUNTER — Other Ambulatory Visit: Payer: Self-pay | Admitting: Family Medicine

## 2020-08-21 NOTE — Telephone Encounter (Signed)
Patient advised about allopurinol rx; patient started rx yesterday. OV appt scheduled for 09/20/20 at 2:00 pm and Lab appt scheduled for 10:00 am. Advised patient if she has any problems with medication to let us know.

## 2020-08-26 ENCOUNTER — Other Ambulatory Visit: Payer: Self-pay | Admitting: Podiatry

## 2020-08-28 ENCOUNTER — Telehealth: Payer: Self-pay

## 2020-08-28 DIAGNOSIS — G4733 Obstructive sleep apnea (adult) (pediatric): Secondary | ICD-10-CM | POA: Diagnosis not present

## 2020-08-28 NOTE — Chronic Care Management (AMB) (Addendum)
Chronic Care Management Pharmacy Assistant   Name: Kathleen Hatfield  MRN: [id number] DOB: Feb 06, 1956  Reason for Encounter: CCM Reminder Call   Recent office visits:  07/12/20 - CCM RN Case Manager   Recent consult visits:  08/20/20 - Podiatry - Right Ankle injection 08/02/20 - Podiatry - No medication changes  Hospital visits:  None in previous 6 months  Medications: Outpatient Encounter Medications as of 08/28/2020  Medication Sig Note   acetaminophen (TYLENOL) 500 MG tablet Take 1,000 mg by mouth every 6 (six) hours as needed for pain.    albuterol (VENTOLIN HFA) 108 (90 Base) MCG/ACT inhaler Inhale 2 puffs into the lungs every 6 (six) hours as needed.    allopurinol (ZYLOPRIM) 100 MG tablet Take 0.5 tablets (50 mg total) by mouth daily.    allopurinol (ZYLOPRIM) 100 MG tablet allopurinol 100 mg tablet  TAKE 1 TABLET BY MOUTH EVERY DAY FOR 30 DAYS    BAYER CONTOUR NEXT TEST test strip 1 each by Other route daily. As directed    budesonide (PULMICORT) 0.5 MG/2ML nebulizer solution Take 2 mLs (0.5 mg total) by nebulization 2 (two) times daily.    buPROPion (WELLBUTRIN XL) 300 MG 24 hr tablet TAKE 1 TABLET BY MOUTH DAILY    Cholecalciferol (VITAMIN D-3) 5000 UNITS TABS Take 5,000 Units by mouth every other day.     colchicine 0.6 MG tablet Take 1 tablet (0.6 mg total) by mouth daily as needed (for gout).    dicyclomine (BENTYL) 20 MG tablet TAKE 1 TABLET UP TO FOUR TIMES A DAY FOR GI CRAMPING, PAIN, AND NAUSEA/VOMITING AS NEEDED    Dulaglutide (TRULICITY) 1.5 MV/7.8IO SOPN Inject 1.5 mg into the skin once a week.    DULoxetine (CYMBALTA) 60 MG capsule TAKE 1 CAPSULE BY MOUTH ONCE DAILY.    furosemide (LASIX) 20 MG tablet Take 3 tablets (60 mg total) by mouth 2 (two) times daily.    gabapentin (NEURONTIN) 100 MG capsule TAKE 3 CAPSULES BY MOUTH AT BEDTIME.    GLOBAL EASE INJECT PEN NEEDLES 32G X 4 MM MISC USE AS DIRECTED WITH VICTOZA AND TRESIBA.    ipratropium-albuterol (DUONEB) 0.5-2.5  (3) MG/3ML SOLN Take 3 mLs by nebulization See admin instructions. Nebulize 3 ml's and inhale into the lungs two to three times a day    meloxicam (MOBIC) 15 MG tablet TAKE 1 TABLET (15 MG TOTAL) BY MOUTH DAILY.    mesalamine (LIALDA) 1.2 g EC tablet Take 1.2 g by mouth daily with breakfast. Take 1.2 grams by mouth once a day with breakfast- increase as directed 08/12/2020: Patient states she takes 1 in the am and 1 in pm.    methylPREDNISolone (MEDROL DOSEPAK) 4 MG TBPK tablet 6 day dose pack - take as directed (Patient not taking: Reported on 07/29/2020)    metolazone (ZAROXOLYN) 5 MG tablet TAKE 1 TABLET BY MOUTH AS NEEDED.    MICROLET LANCETS MISC 1 each by Other route. As directed    ondansetron (ZOFRAN) 4 MG tablet Take 1 tablet (4 mg total) by mouth every 6 (six) hours.    pantoprazole (PROTONIX) 40 MG tablet TAKE 1 TABLET BY MOUTH AT BEDTIME    polyethylene glycol (MIRALAX / GLYCOLAX) packet Take 17 g by mouth daily as needed for mild constipation (MIX AND DRINK AS DIRECTED).     potassium chloride (MICRO-K) 10 MEQ CR capsule TAKE 5 CAPSULES (50 MEQ TOTAL) BY MOUTH 2 (TWO) TIMES DAILY.    propranolol (INDERAL) 10 MG  tablet TAKE 1 TABLET BY MOUTH 2 TIMES DAILY    revefenacin (YUPELRI) 175 MCG/3ML nebulizer solution Take 3 mLs (175 mcg total) by nebulization daily.    rOPINIRole (REQUIP) 4 MG tablet TAKE 1 TABLET BY MOUTH TWICE DAILY    spironolactone (ALDACTONE) 25 MG tablet TAKE 1 TABLET BY MOUTH DAILY.    traMADol (ULTRAM) 50 MG tablet Take 1 tablet (50 mg total) by mouth 3 (three) times daily as needed.    TRESIBA FLEXTOUCH 200 UNIT/ML FlexTouch Pen INJECT 88 UNITS UNDER SKIN ONCE DAILY 08/12/2020: Patient states she takes 90 units 1 time per day   UNABLE TO FIND CPAP- At bedtime    No facility-administered encounter medications on file as of 08/28/2020.   Kathleen Hatfield was contacted to remind her of telephone visit with Debbora Dus on 09/02/20 at 3:30 PM. She was reminded to have all  medications, supplements and any blood glucose and blood pressure readings available for review at appointment.  Are you having any problems with your medications? None   What concerns would you like to discuss with the pharmacist? None  Star Rating Drugs: Medication:   Last Fill: Day Supply Trulicity1.76m/0.5ml  51/32/4428 Tresiba 200 unit/ml  08/14/20  20  PCP appointment on 09/20/20 and CCM appointment on 09/02/20  MDebbora Dus CPP notified  VAvel Sensor CCedar HillsAssistant [phone] I have reviewed the care management and care coordination activities outlined in this encounter and I am certifying that I agree with the content of this note. No further action required.  MDebbora Dus PharmD Clinical Pharmacist LLake San MarcosPrimary Care at [facility]Reg[phone]

## 2020-09-02 ENCOUNTER — Other Ambulatory Visit: Payer: Self-pay

## 2020-09-02 ENCOUNTER — Telehealth: Payer: Self-pay

## 2020-09-02 ENCOUNTER — Ambulatory Visit: Payer: Medicare HMO

## 2020-09-02 DIAGNOSIS — J449 Chronic obstructive pulmonary disease, unspecified: Secondary | ICD-10-CM | POA: Diagnosis not present

## 2020-09-02 DIAGNOSIS — E782 Mixed hyperlipidemia: Secondary | ICD-10-CM

## 2020-09-02 DIAGNOSIS — I5032 Chronic diastolic (congestive) heart failure: Secondary | ICD-10-CM

## 2020-09-02 DIAGNOSIS — I10 Essential (primary) hypertension: Secondary | ICD-10-CM | POA: Diagnosis not present

## 2020-09-02 DIAGNOSIS — E119 Type 2 diabetes mellitus without complications: Secondary | ICD-10-CM | POA: Diagnosis not present

## 2020-09-02 DIAGNOSIS — G720 Drug-induced myopathy: Secondary | ICD-10-CM

## 2020-09-02 NOTE — Progress Notes (Signed)
Chronic Care Management Pharmacy Note 09/02/20 Name:  Kathleen Hatfield MRN:  [id number] DOB:  1956/01/19  Subjective: Kathleen Hatfield is an 65 y.o. year old female who is a primary patient of [name], [name], MD.  The CCM team was consulted for assistance with disease management and care coordination needs.    Engaged with patient by telephone for follow up visit in response to provider referral for pharmacy case management and/or care coordination services.   Consent to Services:  The patient was given information about Chronic Care Management services, agreed to services, and gave verbal consent prior to initiation of services.  Please see initial visit note for detailed documentation.   Patient Care Team: [name], MD as PCP - General (Family Medicine) [name], MD as PCP - Cardiology (Cardiology) [name], MD as Consulting Physician (Pulmonary Disease) [name], MD as Consulting Physician (General Surgery) Benton, [name], MD as Consulting Physician (Pulmonary Disease) Debbora Dus, [facility] as Pharmacist (Pharmacist) Dannielle Karvonen, RN as Case Manager  Recent office visits:  None since initial CCM completed on 07/29/20   Recent consult visits:  08/20/20 - Podiatry 08/02/20 - Podiatry  05/30/20 - Cardiology - [facility]  05/13/20- Cardiology telephone - Stop Torsemide. Start Furosemide 60 mg twice daily.  If weight increases more than 3 lbs in 1 day, call. If edema or shortness of breath worsens after making changes, call.  Hospital visits:  None in previous 6 months  Objective:  Lab Results  Component Value Date   CREATININE 1.53 (H) 06/18/2020   BUN 37 (H) 06/18/2020   GFR 35.51 (L) 06/18/2020   GFRNONAA 60 01/19/2020   GFRAA 69 01/19/2020   NA 140 06/18/2020   K 3.5 06/18/2020   CALCIUM 10.2 06/18/2020   CO2 35 (H) 06/18/2020   GLUCOSE 118 (H) 06/18/2020    Lab Results  Component Value Date/Time   HGBA1C 9.9 (H) 06/11/2020 08:35 AM   HGBA1C  11.5 (A) 05/06/2020 02:38 PM   HGBA1C 9.2 (H) 01/09/2020 05:30 AM   GFR 35.51 (L) 06/18/2020 04:52 PM   GFR 30.23 (L) 06/11/2020 08:35 AM    Last diabetic Eye exam:  Lab Results  Component Value Date/Time   HMDIABEYEEXA No Retinopathy 12/22/2019 12:00 AM    Last diabetic Foot exam: 04/16/20 - normal    Lab Results  Component Value Date   CHOL 333 04/26/2019   HDL 34 (L) 03/25/2013   LDLCALC 133 (H) 03/25/2013   TRIG 469 (A) 04/26/2019   CHOLHDL 6.9 03/25/2013    Hepatic Function Latest Ref Rng & Units 06/11/2020 09/04/2019 06/22/2019  Total Protein 6.0 - 8.3 g/dL 8.0 - 7.6  Albumin 3.5 - 5.2 g/dL 4.4 3.9 4.1  AST 0 - 37 U/L 21 - 17  ALT 0 - 35 U/L 18 - 16  Alk Phosphatase 39 - 117 U/L 89 - 86  Total Bilirubin 0.2 - 1.2 mg/dL 0.4 - 0.5  Bilirubin, Direct 0.0 - 0.3 mg/dL - - -    Lab Results  Component Value Date/Time   TSH 2.850 06/09/2019 12:50 PM   TSH 2.31 04/15/2015 03:45 PM    CBC Latest Ref Rng & Units 06/11/2020 01/22/2020 01/10/2020  WBC 4.0 - 10.5 K/uL 11.5(H) 10.7(H) 17.2(H)  Hemoglobin 12.0 - 15.0 g/dL 14.8 13.1 14.0  Hematocrit 36.0 - 46.0 % 44.0 39.2 41.6  Platelets 150.0 - 400.0 K/uL 359.0 325.0 306    Lab Results  Component Value Date/Time  VD25OH 45.52 06/18/2020 04:52 PM   VD25OH 47.60 06/22/2019 01:59 PM    Clinical ASCVD: No  The ASCVD Risk score Mikey Bussing DC Jr., et al., 2013) failed to calculate for the following reasons:   The valid total cholesterol range is 130 to 320 mg/dL    Depression screen [facility] 2/9 08/12/2020 12/28/2017 09/13/2017  Decreased Interest _0 Down, Depressed, Hopeless _1 PHQ - 2 Score _2 Altered sleeping _3 Tired, decreased energy _4 Change in appetite 2 0 1  Feeling bad or failure about yourself  _5 Trouble concentrating 3 0 3  Moving slowly or fidgety/restless _6 Suicidal thoughts 0 0 0  PHQ-9 Score _7 Difficult doing work/chores Somewhat difficult Very difficult Very difficult  Some recent  data might be hidden    Social History   Tobacco Use  Smoking Status Never  Smokeless Tobacco Never   BP Readings from Last 3 Encounters:  06/18/20 124/72  05/30/20 120/80  05/20/20 140/82   Pulse Readings from Last 3 Encounters:  06/18/20 87  05/30/20 88  05/20/20 97   Wt Readings from Last 3 Encounters:  06/18/20 258 lb (117 kg)  05/30/20 263 lb 9.6 oz (119.6 kg)  05/20/20 260 lb 12.8 oz (118.3 kg)   BMI Readings from Last 3 Encounters:  06/18/20 42.93 kg/m  05/30/20 43.87 kg/m  05/20/20 43.40 kg/m   Iron/TIBC/Ferritin/ %Sat    Component Value Date/Time   IRON 66 09/19/2019 0000   TIBC 430 09/19/2019 0000   FERRITIN 58.3 06/11/2020 0835   FERRITIN 50 09/19/2019 0000   IRONPCTSAT 15 09/19/2019 0000    Assessment/Interventions: Review of patient past medical history, allergies, medications, health status, including review of consultants reports, laboratory and other test data, was performed as part of comprehensive evaluation and provision of chronic care management services.   SDOH:  (Social Determinants of Health) assessments and interventions performed: Yes SDOH Interventions    Flowsheet Row Most Recent Value  SDOH Interventions   Financial Strain Interventions Intervention Not Indicated       SDOH Screenings   Alcohol Screen: Not on file  Depression (PHQ2-9): Medium Risk   PHQ-2 Score: 20  Financial Resource Strain: Low Risk    Difficulty of Paying Living Expenses: Not very hard  Food Insecurity: No Food Insecurity   Worried About Charity fundraiser in the Last Year: Never true   Ran Out of Food in the Last Year: Never true  Housing: Not on file  Physical Activity: Inactive   Days of Exercise per Week: 0 days   Minutes of Exercise per Session: 0 min  Social Connections: Not on file  Stress: Not on file  Tobacco Use: Low Risk    Smoking Tobacco Use: Never   Smokeless Tobacco Use: Never  Transportation Needs: No Transportation Needs   Lack  of Transportation (Medical): No   Lack of Transportation (Non-Medical): No    CCM Care Plan  Allergies  Allergen Reactions   Almond Oil Anaphylaxis, Shortness Of Breath and Swelling   Morphine And Related Shortness Of Breath and Other (See Comments)    Pt. States while in the hospital it affected her breathing, O2 dropped to the 70's   Atorvastatin Other (See Comments)    Leg weakness   Ceclor [Cefaclor] Nausea And Vomiting   Ciprofloxacin Other (See Comments)    Makes joints and muscles  ache   Levaquin [Levofloxacin] Other (See Comments)    Body aches   Losartan Other (See Comments)    Weakness    Statins Other (See Comments)    Leg and body weakness   Sulfa Antibiotics Nausea And Vomiting    Medications Reviewed Today     Reviewed by Mack Hook, LPN (Licensed Practical Nurse) on 08/20/20 at 1430  Med List Status: <None>   Medication Order Taking? Sig Documenting Provider Last Dose Status Informant  acetaminophen (TYLENOL) 500 MG tablet [id number]  Take 1,000 mg by mouth every 6 (six) hours as needed for pain. [provider]  Active Self  albuterol (VENTOLIN HFA) 108 (90 Base) MCG/ACT inhaler [id number]  Inhale 2 puffs into the lungs every 6 (six) hours as needed. [name], MD  Active   allopurinol (ZYLOPRIM) 100 MG tablet [id number]  Take 0.5 tablets (50 mg total) by mouth daily. [name], MD  Active   allopurinol (ZYLOPRIM) 100 MG tablet [id number]  allopurinol 100 mg tablet  TAKE 1 TABLET BY MOUTH EVERY DAY FOR 30 DAYS [provider]  Active   BAYER CONTOUR NEXT TEST test strip [id number]  1 each by Other route daily. As directed [provider]  Active Self           Med Note Tamala Julian, JEFFREY W   Mon May 03, 2017 11:02 PM)    budesonide (PULMICORT) 0.5 MG/2ML nebulizer solution [id number]  Take 2 mLs (0.5 mg total) by nebulization 2 (two) times daily. [name], MD  Active   buPROPion (WELLBUTRIN XL) 300 MG 24 hr tablet  [id number]  TAKE 1 TABLET BY MOUTH DAILY [name], MD  Active Self  Cholecalciferol (VITAMIN D-3) 5000 UNITS TABS [id number]  Take 5,000 Units by mouth every other day.  [provider]  Active Self  colchicine 0.6 MG tablet [id number]  Take 1 tablet (0.6 mg total) by mouth daily as needed (for gout). [name], MD  Active   dicyclomine (BENTYL) 20 MG tablet [id number]  TAKE 1 TABLET UP TO FOUR TIMES A DAY FOR GI CRAMPING, PAIN, AND NAUSEA/VOMITING AS NEEDED [name], MD  Active Self  Dulaglutide (TRULICITY) 1.5 JJ/9.4RD SOPN [id number]  Inject 1.5 mg into the skin once a week. [name], MD  Active   DULoxetine (CYMBALTA) 60 MG capsule [id number]  TAKE 1 CAPSULE BY MOUTH ONCE DAILY. [name], MD  Active   furosemide (LASIX) 20 MG tablet [id number]  Take 3 tablets (60 mg total) by mouth 2 (two) times daily. Richardson Dopp T, PA-C  Active   gabapentin (NEURONTIN) 100 MG capsule [id number]  TAKE 3 CAPSULES BY MOUTH AT BEDTIME. [name], MD  Active   GLOBAL EASE INJECT PEN NEEDLES 32G X 4 MM MISC [id number]  USE AS DIRECTED WITH VICTOZA AND TRESIBA. [name], MD  Active   ipratropium-albuterol (DUONEB) 0.5-2.5 (3) MG/3ML SOLN [id number]  Take 3 mLs by nebulization See admin instructions. Nebulize 3 ml's and inhale into the lungs two to three times a day [provider]  Active Self  meloxicam (MOBIC) 15 MG tablet [id number]  Take 1 tablet (15 mg total) by mouth daily. Edrick Kins, DPM  Active   mesalamine (LIALDA) 1.2 g EC tablet [id number]  Take 1.2 g by mouth daily with breakfast. Take 1.2 grams by mouth once a day with breakfast- increase as directed [provider]  Active  Med Note Nyoka Cowden, [place] Aug 12, 2020  1:50 PM) Patient states she takes 1 in the am and 1 in pm.   methylPREDNISolone (MEDROL DOSEPAK) 4 MG TBPK tablet [id number]  6 day dose pack - take as directed  Patient not taking: Reported on 07/29/2020    Edrick Kins, DPM  Active   metolazone (ZAROXOLYN) 5 MG tablet [id number]  TAKE 1 TABLET BY MOUTH AS NEEDED. [name], MD  Active   MICROLET LANCETS [place] [id number]  1 each by Other route. As directed [provider]  Active Self           Med Note Duffy Bruce, Ardelia Mems Oct 21, 2015  3:31 PM)    ondansetron (ZOFRAN) 4 MG tablet [id number]  Take 1 tablet (4 mg total) by mouth every 6 (six) hours. Faustino Congress, NP  Active   pantoprazole (PROTONIX) 40 MG tablet [id number]  TAKE 1 TABLET BY MOUTH AT BEDTIME [name], MD  Active   polyethylene glycol [facility] / GLYCOLAX) packet [id number]  Take 17 g by mouth daily as needed for mild constipation (MIX AND DRINK AS DIRECTED).  [provider]  Active Self  potassium chloride (MICRO-K) 10 MEQ CR capsule [id number]  TAKE 5 CAPSULES (50 MEQ TOTAL) BY MOUTH 2 (TWO) TIMES DAILY. Richardson Dopp T, PA-C  Active   propranolol (INDERAL) 10 MG tablet [id number]  TAKE 1 TABLET BY MOUTH 2 TIMES DAILY [name], MD  Active   revefenacin [facility]) 175 MCG/3ML nebulizer solution [id number]  Take 3 mLs (175 mcg total) by nebulization daily. [name], MD  Active   rOPINIRole (REQUIP) 4 MG tablet [id number]  TAKE 1 TABLET BY MOUTH TWICE DAILY [name], MD  Active   spironolactone (ALDACTONE) 25 MG tablet [id number]  TAKE 1 TABLET BY MOUTH DAILY. [name], MD  Active   traMADol Veatrice [place]) 50 MG tablet [id number]  Take 1 tablet (50 mg total) by mouth 3 (three) times daily as needed. [name], MD  Active   TRESIBA [facility] 200 UNIT/ML FlexTouch Pen [id number]  INJECT 2 UNITS UNDER SKIN ONCE DAILY [name], MD  Active            Med Note Nyoka Cowden, [place] Aug 12, 2020  1:48 PM) Patient states she takes 90 units 1 time per day  [place] [id number]  CPAP- At bedtime [provider]  Active Self            Patient Active Problem List   Diagnosis Date Noted   Pain of foot 06/20/2020    Medicare annual wellness visit, initial 06/20/2020   Constipation 06/11/2020   Diverticulosis of colon 06/11/2020   Irritable bowel syndrome 53/61/4431   Periumbilical pain 54/00/8676   RLS (restless legs syndrome)    Acute asthma exacerbation 01/09/2020   Asthma exacerbation 01/08/2020   Muscle weakness 01/08/2020   Grade I diastolic dysfunction 19/50/9326   Statin myopathy 10/10/2019   Advance care planning 06/14/2019   RLQ abdominal pain 06/14/2019   COPD (chronic obstructive pulmonary disease) ([place]) 06/14/2019   Leukocytosis 06/14/2019   Hypercalcemia 06/14/2019   Gout 06/14/2019   COPD with acute exacerbation ([place]) 05/03/2017   Acute renal failure superimposed on stage 2 chronic kidney disease ([place]) 04/02/2016   Diabetes mellitus without complication (HCC)    CKD (chronic kidney disease)    Hypersomnia with sleep apnea 09/16/2015   Fatigue  06/07/2015   Morbid obesity ([place]) 06/07/2015   Chronic diastolic heart failure (HCC)    OSA (obstructive sleep apnea) 08/23/2012   Hypertension    Depression    GERD (gastroesophageal reflux disease)    Hyperlipidemia    Ulcerative colitis ([place])    Patellar tendinitis 05/25/2011   Knee pain 05/25/2011   Myofascial pain 05/25/2011   Cervicalgia 05/25/2011    Immunization History  Administered Date(s) Administered   Influenza Split 12/01/2012, 01/18/2017   Influenza Whole 12/25/2011   Influenza,inj,Quad PF,6+ Mos 11/27/2013, 12/08/2014, 12/11/2015   Influenza-Unspecified 12/07/2017   Janssen (J&J) SARS-COV-2 Vaccination 06/15/2019   Pneumococcal Conjugate-13 02/17/2017   Pneumococcal Polysaccharide-23 11/27/2013    Conditions to be addressed/monitored:  Hypertension, Hyperlipidemia, Diabetes, Heart Failure, COPD, Asthma, Chronic Kidney Disease, Depression and Osteoarthritis  Care Plan : [place]  Updates made by Debbora Dus, [place] since 09/06/2020 12:00 AM     Problem: CHL AMB "PATIENT-SPECIFIC PROBLEM"       Long-Range Goal: Disease Management   Start Date: 07/29/2020  Priority: High  Note:   Current Barriers:  Unable to maintain control of diabetes  Difficulty remembering to take mid-day and afternoon medications  Pharmacist Clinical Goal(s):  Patient will contact provider office for questions/concerns as evidenced notation of same in electronic health record through collaboration with PharmD and provider.   Interventions: 1:1 collaboration with [name], MD regarding development and update of comprehensive plan of care as evidenced by provider attestation and co-signature Inter-disciplinary care team collaboration (see longitudinal plan of care) Comprehensive medication review performed; medication list updated in electronic medical record  Hypertension (BP goal < 140/90) Controlled - per clinic and home readings  -Beta blocker started for palpitations and tremors. Previously seen by Dr. Wells Guiles Tat with [facility].  -Current treatment: Propranolol 10 mg - 1 twice daily (pt taking differently: once daily at night for past 3 weeks due to concern about daytime drowsiness) Spironolactone 25 mg - 1 tablet daily in morning  -Medications previously tried: metoprolol (switched to propranolol due to tremors 10/2019), losartan (fatigue, weakness) -Home BP - reports BP is staying around 100-130/60-80 mmHg, denies any palpitations -Pt states the propranolol makes her very drowsy. She has felt less drowsy taking it once daily at night. As long as BP stays down and no palpitations, this is okay to continue.  -Recommend continue current medications   Hyperlipidemia: (LDL goal < 100) -Not ideally controlled TG 460 2021, LDL inaccurate due to elevated TG Risk factors - diabetes  -Current treatment: None -Medications previously tried: Multiple statins (atorvastatin, others) - leg weakness -Educated on Cholesterol goals;  -Recommended weight loss and diabetes control to reduce  cardiovascular risk  Diabetes (A1c goal <7%) -Uncontrolled - A1c 9.9% (06/11/20) -Current medications: Trulicity 1.5 mg - Inject once weekly on Thursdays  Tresiba - 90 units daily  -Medications previously tried: none reported -Current home glucose readings - checking daily before first meal fasting glucose:  6/12 - 133 6/13 - 131 6/14 -6/15 - no readings 6/16 - 138 6/17 - 141 6/18 - 163 6/20 - 152 6/21 - 212 (does not recall if she had anything to eat before) 6/22 - did not check 6/23 - 155 6/24 - did not check 6/25 - 156 post prandial glucose:  6/19 - 213  (after food) -Denies hypoglycemic/hyperglycemic symptoms, denies any BG levels < 70 -Current meal patterns: The patient reports she is on a salt free, sugar free, gluten free diet but had a hard  time sticking to this. She tries to limit portions.  -Educated on A1c and blood sugar goals; -Counseled to check feet daily and get yearly eye exams - annual eye and foot exam up to date  -Recommended to continue current medication; Increase Trulicity 3 mg once weekly (PCP consult)  Heart Failure (Goal: manage symptoms and prevent exacerbations) -Controlled - stable, no exacerbations in past 12 months -Followed by Dr. [name] with [facility] [place] ejection fraction: 70% normal (Date: 03/21) -HF type: Diastolic -NYHA Class: II (slight limitation of activity) -AHA HF Stage: C (Heart disease and symptoms present) -Current treatment: Spironolactone 25 mg - 1 tablet daily in morning  Furosemide 20 mg - 3 tablets at breakfast and noon  Potassium 10 mEq - 5 capsules at breakfast and noon Metolazone - take 1 tablet as needed for weight gain of 3 pounds in 24 hours or 5 pounds in 1 week -Medications previously tried: torsemide, bumetanide  She has been checking weight daily: 6/13 - 261 6/14 - 259 6/18 - 259 6/19 - 263 - took 1 metolazone 6/20 - did not record weight, but noticeable swelling - took 1 metolazone 6/25 -  260 6/27 - She has not checked weight yet today but denies swelling/SOB Adherence - Last visit pt endorsed 80% compliance - 1-2 days per week misses evening dose of furosemide and potassium. She takes 8 potassium instead of 10 some days due to stomach feeling full.  Today she reports about 1/2 time she has to delay the second dose of Lasix/potassium and take at bedtime or as soon as she remembers.  -Recommended to continue current medication; I suggest cardiology visit to discuss simplifying her diuretic/potassium to once daily if possible.   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) -Followed by [name], MD [place], last seen 05/2020, started Yupelri, follow up in 3 months. -She reports her SOB has not improved as much as she expected with the [place]. She has been having to use the albuterol nebulizer 1-2 times day. She was hoping she could use the albuterol less. She is due to pulmonary follow up. -Current treatment  Maintenance: Pulmicort nebuilzer - one vial nebulized in the morning and one vial nebulized in the evening, and rinse your mouth after each use of this Yupelri nebuilzer-  one vial nebulized daily Rescue: Albuterol nebulizer - takes 2-3 times per day as needed -Compliant with CPAP -Medications previously tried: per pt inhalers less effective, Perforomist was unaffordable  -Gold Grade: Gold 3 (FEV1 30-49%) -Pulmonary function testing: 2021 FEV1 % pred (post) 42% -Current COPD Classification:  B (high sx, <2 exacerbations/yr) -Exacerbations requiring treatment in last 6 months: 01/2020 - required hospitalization -Patient reports consistent use of maintenance inhaler -Frequency of rescue inhaler use: 2-3 times daily  -Recommended to continue current medication and Schedule f/u with pulm - 3 months since last visit and due Dr. Halford Chessman.  Depression/Anxiety/Pain(Goal: Control symptoms) -Controlled - per patient report -Current  treatment: Bupropion 300 mg - 1 tablet daily  Cymbalta 60 mg - 1 in the evening (for mood and neuropathy) Gabapentin 100 mg - 3 caps at bedtime  Ropinirole 4 mg - 1 tablet twice daily for RLS  -Medications previously tried/failed: none -I am concerned her ropinirole may be causing her to fall asleep suddenly during the day. This has been discussed with patient before and per chart records, she was unable to tolerate dose reductions. We will revisit this issue at follow up visit.  -  Recommended to continue current medication  Gout (Goal: Prevent gout flares) -Improving, PCP started allopurinol 08/18/20 Pt reports doing well on allopurinol so far, taking daily as prescribed. She went to see podiatry this month and apparently had a gout flare. -Current treatment: Allopurinol 50 mg - 1/2 tablet daily (new) taking in evening Colchicine - 1 daily at first sign of flare -Recommended to continue current medication  Patient Goals/Self-Care Activities Patient will:  - follow up with pulmonology about Yupelri - follow up with Cardiology about your Lasix and Potassium once daily dosing  Follow Up Plan: Telephone follow up appointment with care management team member scheduled for: 30 days        Medication Assistance: None required.  Patient affirms current coverage meets needs.  Patient's preferred pharmacy is: [place], [address] [place] [address] Phone: [phone] Fax: [phone]  [id number] [place] [address]Phone: [phone]: [phone] Uses pill box? No - she uses pill packs for maintenance meds   -She has a basket with the rest of her medications - inhalers, injections -She has a hard time remembering if she took them, including her packaged medications due to the timing throughout the day -She has a pack for breakfast and  evening.  Other -Lialda - not taking currently  As needed:  -Bentyl - uses as needed for flares -Tylenol - 1 before bedtime -Meloxicam 15 mg - short term for arthritis in ankle -Metolazone - for fluid overload  -Tramadol - PRN diverticulitis  Not in packs but daily -Furosemide - 3 in morning and 3 at lunch (4 hours after breakfast) -Potassium - 5 twice daily (capsules - tablets were difficult to swallow)  Morning -Wellbutrin - daily  -Vitamin D3 - every other day -Ropinirole 4 mg - twice daily  -Pantoprazole 40 mg -Spironolactone daily   Evening  -Cymbalta - daily  She gets packaged medications from [facility]. [facility]   Care Plan and Follow Up Patient Decision:  Patient agrees to Care Plan and Follow-up.  MDebbora Dus PharmD Clinical Pharmacist [facility] at [phone]

## 2020-09-02 NOTE — Telephone Encounter (Signed)
Patient's fasting BG below on Trulicity 1.5 mg/week for the past 2 months. She is interested in dose increase to 3 mg to further support weight loss and BG lowering. Denies any hypoglycemia and she continues [place] 90 units daily. If approved, she would like to pick up at [place].  All before breakfast: 6/12 - 133 6/13 - 131 6/14 -15 - no readings 6/16 - 138 6/17 - 141 6/18 - 163 6/20 - 152 6/21 - 212  6/22 - did not check 6/23 - 155 6/24 - did not check 6/25 - [place], PharmD Clinical Pharmacist [place] [facility] [phone]

## 2020-09-02 NOTE — Telephone Encounter (Signed)
Patient reports red rash with raised bumps under both breasts due to heat. She is inquiring about a topical cream. She is using lotrimin cream without relief. She prefers to avoid powders due to her asthma. [facility] is using [facility] [place].  Debbora Dus, PharmD Clinical Pharmacist [place] [facility] at [facility] [phone]

## 2020-09-03 MED ORDER — KETOCONAZOLE 2 % EX CREA: 1.0000 "application " | TOPICAL_CREAM | Freq: Two times a day (BID) | CUTANEOUS | 1 refills | Status: DC

## 2020-09-03 MED ORDER — TRULICITY 3 MG/0.5ML ~~LOC~~ SOAJ: 3.0000 mg | SUBCUTANEOUS | 5 refills | Status: DC

## 2020-09-03 NOTE — Telephone Encounter (Signed)
LMTCB

## 2020-09-03 NOTE — Telephone Encounter (Signed)
Patient aware new rx sent for trulicity

## 2020-09-03 NOTE — Telephone Encounter (Signed)
Patient notified rx sent and advised to update our office as needed about the rash.

## 2020-09-03 NOTE — Telephone Encounter (Signed)
Patient returned call . Would like a call back

## 2020-09-03 NOTE — Telephone Encounter (Signed)
Agreed.  Sent. Thanks.

## 2020-09-03 NOTE — Telephone Encounter (Signed)
Would try changing to ketoconazole cream.  Rx sent.  Please update patient that I sent rx for higher dose of trulicity.    Update me as needed about the rash.  Thanks.

## 2020-09-06 NOTE — Patient Instructions (Signed)
Dear Kathleen Hatfield,  Below is a summary of the goals we discussed during our follow up appointment on September 02, 2020. Please contact me anytime with questions or concerns.   Visit Information  Current Barriers:  Unable to maintain control of diabetes  Difficulty remembering to take mid-day and afternoon medications  Pharmacist Clinical Goal(s):  Patient will contact provider office for questions/concerns as evidenced notation of same in electronic health record through collaboration with PharmD and provider.   Interventions: 1:1 collaboration with [name], MD regarding development and update of comprehensive plan of care as evidenced by provider attestation and co-signature Inter-disciplinary care team collaboration (see longitudinal plan of care) Comprehensive medication review performed; medication list updated in electronic medical record  Hypertension (BP goal < 140/90) Controlled - per clinic and home readings  -Beta blocker started for palpitations and tremors. Previously seen by Dr. Wells Guiles Tat with [facility].  -Current treatment: Propranolol 10 mg - 1 twice daily (pt taking differently: once daily at night for past 3 weeks due to concern about daytime drowsiness) Spironolactone 25 mg - 1 tablet daily in morning  -Medications previously tried: metoprolol (switched to propranolol due to tremors 10/2019), losartan (fatigue, weakness) -Home BP - reports BP is staying around 100-130/60-80 mmHg, denies any palpitations -Pt states the propranolol makes her very drowsy. She has felt less drowsy taking it once daily at night. As long as BP stays down and no palpitations, this is okay to continue.  -Recommend continue current medications   Hyperlipidemia: (LDL goal < 100) -Not ideally controlled TG 460 2021, LDL inaccurate due to elevated TG Risk factors - diabetes  -Current treatment: None -Medications previously tried: Multiple statins (atorvastatin, others) - leg  weakness -Educated on Cholesterol goals;  -Recommended weight loss and diabetes control to reduce cardiovascular risk  Diabetes (A1c goal <7%) -Uncontrolled - A1c 9.9% (06/11/20) -Current medications: Trulicity 1.5 mg - Inject once weekly on Thursdays  Tresiba - 90 units daily  -Medications previously tried: none reported -Current home glucose readings - checking daily before first meal fasting glucose:  6/12 - 133 6/13 - 131 6/14 -6/15 - no readings 6/16 - 138 6/17 - 141 6/18 - 163 6/20 - 152 6/21 - 212 (does not recall if she had anything to eat before) 6/22 - did not check 6/23 - 155 6/24 - did not check 6/25 - 156 post prandial glucose:  6/19 - 213  (after food) -Denies hypoglycemic/hyperglycemic symptoms, denies any BG levels < 70 -Current meal patterns: The patient reports she is on a salt free, sugar free, gluten free diet but had a hard time sticking to this. She tries to limit portions.  -Educated on A1c and blood sugar goals; -Counseled to check feet daily and get yearly eye exams - annual eye and foot exam up to date  -Recommended to continue current medication; Increase Trulicity 3 mg once weekly (PCP consult)  Heart Failure (Goal: manage symptoms and prevent exacerbations) -Controlled - stable, no exacerbations in past 12 months -Followed by Dr. [name] with [facility] -Last ejection fraction: 70% normal (Date: 03/21) -HF type: Diastolic -NYHA Class: II (slight limitation of activity) -AHA HF Stage: C (Heart disease and symptoms present) -Current treatment: Spironolactone 25 mg - 1 tablet daily in morning  Furosemide 20 mg - 3 tablets at breakfast and noon  Potassium 10 mEq - 5 capsules at breakfast and noon Metolazone - take 1 tablet as needed for weight gain of 3 pounds in  24 hours or 5 pounds in 1 week -Medications previously tried: torsemide, bumetanide  She has been checking weight daily: 6/13 - 261 6/14 - 259 6/18 - 259 6/19 - 263 - took  1 metolazone 6/20 - did not record weight, but noticeable swelling - took 1 metolazone 6/25 - 260 6/27 - She has not checked weight yet today but denies swelling/SOB Adherence - Last visit pt endorsed 80% compliance - 1-2 days per week misses evening dose of furosemide and potassium. She takes 8 potassium instead of 10 some days due to stomach feeling full.  Today she reports about 1/2 time she has to delay the second dose of Lasix/potassium and take at bedtime or as soon as she remembers.  -Recommended to continue current medication; I suggest cardiology visit to discuss simplifying her diuretic/potassium to once daily if possible.   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 -She reports her SOB has not improved as much as she expected with the [place]. She has been having to use the albuterol nebulizer 1-2 times day. She was hoping she could use the albuterol less. She is due to pulmonary follow up. -Current treatment  Maintenance: Pulmicort nebuilzer - one vial nebulized in the morning and one vial nebulized in the evening, and rinse your mouth after each use of this Yupelri nebuilzer-  one vial nebulized daily Rescue: Albuterol nebulizer - takes 2-3 times per day as needed -Compliant with CPAP -Medications previously tried: per pt inhalers less effective, Perforomist was unaffordable  -Gold Grade: Gold 3 (FEV1 30-49%) -Pulmonary function testing: 2021 FEV1 % pred (post) 42% -Current COPD Classification:  B (high sx, <2 exacerbations/yr) -Exacerbations requiring treatment in last 6 months: 01/2020 - required hospitalization -Patient reports consistent use of maintenance inhaler -Frequency of rescue inhaler use: 2-3 times daily  -Recommended to continue current medication and Schedule f/u with pulm - 3 months since last visit and due Dr.  Halford Chessman.  Depression/Anxiety/Pain(Goal: Control symptoms) -Controlled - per patient report -Current treatment: Bupropion 300 mg - 1 tablet daily  Cymbalta 60 mg - 1 in the evening (for mood and neuropathy) Gabapentin 100 mg - 3 caps at bedtime  Ropinirole 4 mg - 1 tablet twice daily for RLS  -Medications previously tried/failed: none -I am concerned her ropinirole may be causing her to fall asleep suddenly during the day. This has been discussed with patient before and per chart records, she was unable to tolerate dose reductions. We will revisit this issue at follow up visit.  -Recommended to continue current medication  Gout (Goal: Prevent gout flares) -Improving, PCP started allopurinol 08/18/20 Pt reports doing well on allopurinol so far, taking daily as prescribed. She went to see podiatry this month and apparently had a gout flare. -Current treatment: Allopurinol 50 mg - 1/2 tablet daily (new) taking in evening Colchicine - 1 daily at first sign of flare -Recommended to continue current medication  Patient Goals/Self-Care Activities Patient will:  - follow up with pulmonology about Yupelri - follow up with Cardiology about your Lasix and Potassium once daily dosing  Follow Up Plan: Telephone follow up appointment with care management team member scheduled for: 30 days     Patient verbalizes understanding of instructions provided today and agrees to view in [place].   Debbora Dus, PharmD Clinical Pharmacist [facility] at [facility] [phone]'

## 2020-09-12 ENCOUNTER — Ambulatory Visit (INDEPENDENT_AMBULATORY_CARE_PROVIDER_SITE_OTHER): Payer: Medicare HMO

## 2020-09-12 DIAGNOSIS — J449 Chronic obstructive pulmonary disease, unspecified: Secondary | ICD-10-CM

## 2020-09-12 DIAGNOSIS — E119 Type 2 diabetes mellitus without complications: Secondary | ICD-10-CM | POA: Diagnosis not present

## 2020-09-12 NOTE — Patient Instructions (Signed)
Visit Information:  Thank you for taking the time to speak with me to today.   PATIENT GOALS:  Goals Addressed             This Visit's Progress    Monitor and Manage My Blood Sugar-Diabetes Type 2   On track    Timeframe:  Long-Range Goal Priority:  High Start Date:  08/12/2020                           Expected End Date: 12/06/2020                      Follow Up Date  10/24/2020   - Continue to check blood sugar at prescribed times.  Check blood sugar if you feel it is too high or too low and take your blood sugar readings or monitor to your doctor appointment.  - Plan to eat low carbohydrate and low salt meals, watch portion sizes and avoid sugar sweetened drinks.  - continue to follow a realistic goal ( set small achieving goals for weight lost, example 5 lbs in 1 month, meet that goal then set another attainable goal) - Incorporate exercise into your daily routine. (review exercise booklet regarding chair exercises sent to you in the mail)   Why is this important?   Checking your blood sugar at home helps to keep it from getting very high or very low.  Writing the results in a diary or log helps the doctor know how to care for you.  Your blood sugar log should have the time, date and the results.  Also, write down the amount of insulin or other medicine that you take.  Other information, like what you ate, exercise done and how you were feeling, will also be helpful.         Track and Manage My Symptoms-COPD   On track    Timeframe:  Long-Range Goal Priority:  High Start Date:    08/12/2020                         Expected End Date:  12/06/2020                     Follow Up Date 10/24/2020    - Continue to take your medications as prescribed and rinse mouth out after use on inhalers -Review education information on COPD management/ action plan sent to you in MyChart by your nurse case manager - Incorporate light exercise as tolerated at least 3 times per week. (Review exercise  booklet sent to you in the mail for light chair exercises) - keep follow-up appointments - listen for public air quality announcements every day -Identify and avoid symptom triggers, such as second hand smoke, virus, weather change, emotional upset, heavy exercise,  and environmental allergens that could exacerbate your COPD or asthma.  - Continue to wear your CPAP machine nightly as advised by your doctor.  -Schedule follow up appointments with your pulmonologist and cardiologist.    Why is this important?   Tracking your symptoms and other information about your health helps your doctor plan your care.  Write down the symptoms, the time of day, what you were doing and what medicine you are taking.  You will soon learn how to manage your symptoms.              Patient verbalizes understanding of instructions  provided today and agrees to view in [place].   The patient has been provided with contact information for the care management team and has been advised to call with any health related questions or concerns.  The care management team will reach out to the patient again over the next 45 days.   Quinn Plowman RN,BSN,CCM RN Case Manager [place]  [phone]

## 2020-09-12 NOTE — Chronic Care Management (AMB) (Signed)
Chronic Care Management   CCM RN Visit Note  09/12/2020 Name: Kathleen Hatfield MRN: [id number] DOB: September 11, 1955  Subjective: Kathleen Hatfield is a 65 y.o. year old female who is a primary care patient of [name], MD. The care management team was consulted for assistance with disease management and care coordination needs.    Engaged with patient by telephone for follow up visit in response to provider referral for case management and/or care coordination services.   Consent to Services:  The patient was given information about Chronic Care Management services, agreed to services, and gave verbal consent prior to initiation of services.  Please see initial visit note for detailed documentation.   Patient agreed to services and verbal consent obtained.   Assessment: Review of patient past medical history, allergies, medications, health status, including review of consultants reports, laboratory and other test data, was performed as part of comprehensive evaluation and provision of chronic care management services.   SDOH (Social Determinants of Health) assessments and interventions performed:    CCM Care Plan  Allergies  Allergen Reactions   Almond Oil Anaphylaxis, Shortness Of Breath and Swelling   Morphine And Related Shortness Of Breath and Other (See Comments)    Pt. States while in the hospital it affected her breathing, O2 dropped to the 70's   Atorvastatin Other (See Comments)    Leg weakness   Ceclor [Cefaclor] Nausea And Vomiting   Ciprofloxacin Other (See Comments)    Makes joints and muscles ache   Levaquin [Levofloxacin] Other (See Comments)    Body aches   Losartan Other (See Comments)    Weakness    Statins Other (See Comments)    Leg and body weakness   Sulfa Antibiotics Nausea And Vomiting    Outpatient Encounter Medications as of 09/12/2020  Medication Sig Note   allopurinol (ZYLOPRIM) 100 MG tablet Take 0.5 tablets (50 mg total) by mouth daily.    propranolol  (INDERAL) 10 MG tablet TAKE 1 TABLET BY MOUTH 2 TIMES DAILY 09/12/2020: Patient states she only takes 1 per day at hs.    acetaminophen (TYLENOL) 500 MG tablet Take 1,000 mg by mouth every 6 (six) hours as needed for pain.    albuterol (VENTOLIN HFA) 108 (90 Base) MCG/ACT inhaler Inhale 2 puffs into the lungs every 6 (six) hours as needed.    allopurinol (ZYLOPRIM) 100 MG tablet allopurinol 100 mg tablet  TAKE 1 TABLET BY MOUTH EVERY DAY FOR 30 DAYS    BAYER CONTOUR NEXT TEST test strip 1 each by Other route daily. As directed    budesonide (PULMICORT) 0.5 MG/2ML nebulizer solution Take 2 mLs (0.5 mg total) by nebulization 2 (two) times daily.    buPROPion (WELLBUTRIN XL) 300 MG 24 hr tablet TAKE 1 TABLET BY MOUTH DAILY    Cholecalciferol (VITAMIN D-3) 5000 UNITS TABS Take 5,000 Units by mouth every other day.     colchicine 0.6 MG tablet Take 1 tablet (0.6 mg total) by mouth daily as needed (for gout).    dicyclomine (BENTYL) 20 MG tablet TAKE 1 TABLET UP TO FOUR TIMES A DAY FOR GI CRAMPING, PAIN, AND NAUSEA/VOMITING AS NEEDED    Dulaglutide (TRULICITY) 3 KX/3.8HW SOPN Inject 3 mg as directed once a week.    DULoxetine (CYMBALTA) 60 MG capsule TAKE 1 CAPSULE BY MOUTH ONCE DAILY.    furosemide (LASIX) 20 MG tablet Take 3 tablets (60 mg total) by mouth 2 (two) times daily.    gabapentin (NEURONTIN)  100 MG capsule TAKE 3 CAPSULES BY MOUTH AT BEDTIME.    GLOBAL EASE INJECT PEN NEEDLES 32G X 4 MM MISC USE AS DIRECTED WITH VICTOZA AND TRESIBA.    ipratropium-albuterol (DUONEB) 0.5-2.5 (3) MG/3ML SOLN Take 3 mLs by nebulization See admin instructions. Nebulize 3 ml's and inhale into the lungs two to three times a day    ketoconazole (NIZORAL) 2 % cream Apply 1 application topically 2 (two) times daily.    meloxicam (MOBIC) 15 MG tablet TAKE 1 TABLET (15 MG TOTAL) BY MOUTH DAILY.    mesalamine (LIALDA) 1.2 g EC tablet Take 1.2 g by mouth daily with breakfast. Take 1.2 grams by mouth once a day with  breakfast- increase as directed 08/12/2020: Patient states she takes 1 in the am and 1 in pm.    methylPREDNISolone (MEDROL DOSEPAK) 4 MG TBPK tablet 6 day dose pack - take as directed (Patient not taking: Reported on 07/29/2020)    metolazone (ZAROXOLYN) 5 MG tablet TAKE 1 TABLET BY MOUTH AS NEEDED.    MICROLET LANCETS MISC 1 each by Other route. As directed    ondansetron (ZOFRAN) 4 MG tablet Take 1 tablet (4 mg total) by mouth every 6 (six) hours.    pantoprazole (PROTONIX) 40 MG tablet TAKE 1 TABLET BY MOUTH AT BEDTIME    polyethylene glycol (MIRALAX / GLYCOLAX) packet Take 17 g by mouth daily as needed for mild constipation (MIX AND DRINK AS DIRECTED).     potassium chloride (MICRO-K) 10 MEQ CR capsule TAKE 5 CAPSULES (50 MEQ TOTAL) BY MOUTH 2 (TWO) TIMES DAILY.    revefenacin (YUPELRI) 175 MCG/3ML nebulizer solution Take 3 mLs (175 mcg total) by nebulization daily.    rOPINIRole (REQUIP) 4 MG tablet TAKE 1 TABLET BY MOUTH TWICE DAILY    spironolactone (ALDACTONE) 25 MG tablet TAKE 1 TABLET BY MOUTH DAILY.    traMADol (ULTRAM) 50 MG tablet Take 1 tablet (50 mg total) by mouth 3 (three) times daily as needed.    TRESIBA FLEXTOUCH 200 UNIT/ML FlexTouch Pen INJECT 88 UNITS UNDER SKIN ONCE DAILY 08/12/2020: Patient states she takes 90 units 1 time per day   UNABLE TO FIND CPAP- At bedtime    No facility-administered encounter medications on file as of 09/12/2020.    Patient Active Problem List   Diagnosis Date Noted   Pain of foot 06/20/2020   Medicare annual wellness visit, initial 06/20/2020   Constipation 06/11/2020   Diverticulosis of colon 06/11/2020   Irritable bowel syndrome 88/50/2774   Periumbilical pain 12/87/8676   RLS (restless legs syndrome)    Acute asthma exacerbation 01/09/2020   Asthma exacerbation 01/08/2020   Muscle weakness 01/08/2020   Grade I diastolic dysfunction 72/11/4707   Statin myopathy 10/10/2019   Advance care planning 06/14/2019   RLQ abdominal pain  06/14/2019   COPD (chronic obstructive pulmonary disease) ([place]) 06/14/2019   Leukocytosis 06/14/2019   Hypercalcemia 06/14/2019   Gout 06/14/2019   COPD with acute exacerbation ([place]) 05/03/2017   Acute renal failure superimposed on stage 2 chronic kidney disease ([place]) 04/02/2016   Diabetes mellitus without complication (HCC)    CKD (chronic kidney disease)    Hypersomnia with sleep apnea 09/16/2015   Fatigue 06/07/2015   Morbid obesity ([place]) 06/07/2015   Chronic diastolic heart failure (HCC)    OSA (obstructive sleep apnea) 08/23/2012   Hypertension    Depression    GERD (gastroesophageal reflux disease)    Hyperlipidemia    Ulcerative colitis ([place])  Patellar tendinitis 05/25/2011   Knee pain 05/25/2011   Myofascial pain 05/25/2011   Cervicalgia 05/25/2011    Conditions to be addressed/monitored:COPD and DMII  Care Plan : COPD (Adult)  Updates made by Dannielle Karvonen, RN since 09/12/2020 12:00 AM   Problem: Disease Progression (COPD )   Priority: High   Long-Range Goal: Disease progression minimized and managed   Start Date: 08/12/2020  Expected End Date: 12/06/2020  This Visit's Progress: On track  Recent Progress: On track  Priority: High  Current Barriers:  Patient reports being in the yellow zoned today of her COPD action plan. She reports noticing a slight increase in shortness of breath when changing positions from sitting to standing, laying down to sitting up. Patient confirms she also has CHF.  She denies any increase in swelling in her lower extremities. She reports today's is 263 lbs stating she is up 3 lbs over the last 3 days.  Patient reports implementing her action plan by taking an extra Metolazone on yesterday and states she plans to take an extra one today. Patient states she is taking all other medications as prescribed.   RNCM advised patient to call her provider to notify them of her symptoms increase. Patient verbalized understanding.   Patient reports her  left ankle gave out on her a few days ago and she fell in the house without injury.  Patient states she has an air cast boot that she wears for her weak ankles. She states she did not have the boot on at time of fall.  She reports she is wearing the boot now.  Patient states she has a follow up with the podiatrist who follows her for this issue on 10/01/2020. Knowledge deficits related to basic understanding of COPD disease process and self care management Case Manager Clinical Goal(s): patient will report using inhalers as prescribed including rinsing mouth after use patient will report utilizing pursed lip breathing for shortness of breath patient will verbalize understanding of COPD action plan and when to seek appropriate levels of medical care patient will engage in lite exercise as tolerated to build/regain stamina and strength and reduce shortness of breath through activity tolerance patient will verbalize basic understanding of COPD disease process and self care activities patient will not be hospitalized for COPD exacerbation as evidenced by taking her medications as prescribed, reporting new or ongoing symptoms to her provider and keeping scheduled follow up visits with provider as recommended.  Interventions:  Collaboration with [name], MD regarding development and update of comprehensive plan of care as evidenced by provider attestation and co-signature Inter-disciplinary care team collaboration (see longitudinal plan of care):  [facility] reinforced practice pharmacist advisement to patient to call and scheduled follow up with her pulmonologist and cardiologist. Patient verbalized understanding and agreement.  Provided patient with basic verbal COPD education on self care/management/and exacerbation prevention  Provided patient with COPD action plan and reinforced importance of daily self assessment Provided instruction about proper use of medications used for management of COPD  including inhalers Advised patient to self assesses COPD action plan zone and make appointment with provider if in the yellow zone for 48 hours without improvement. Advised patient to engage in light exercise as tolerated 3-5 days a week.  (Patient states she does not recall receiving her exercise booklet.   RNCM will resend to patient.  Provided education about and advised patient to utilize infection prevention strategies to reduce risk of respiratory infection  Reviewed upcoming provider appointments with  patient.  Patient states her next follow up with her primary care provider is 09/20/2020 and next follow up with her podiatrist is 10/01/2020.  Advised to call pulmonologist and cardiologist to schedule follow up appointments.  Self-Care Activities:  Self administers medications as prescribed Attends scheduled provider appointments Calls pharmacy for medication refills Calls provider office for new concerns or questions Patient Goals: - Continue to take your medications as prescribed and rinse mouth out after use on inhalers -Review education information on COPD management/ action plan sent to you in MyChart by your nurse case manager - Incorporate light exercise as tolerated at least 3 times per week. (Review exercise booklet sent to you in the mail for light chair exercises) - keep follow-up appointments - listen for public air quality announcements every day -Identify and avoid symptom triggers, such as second hand smoke, virus, weather change, emotional upset, heavy exercise,  and environmental allergens that could exacerbate your COPD or asthma.  - Continue to wear your CPAP machine nightly as advised by your doctor.  -Schedule follow up appointments with your pulmonologist and cardiologist.  Follow Up Plan: The patient has been provided with contact information for the care management team and has been advised to call with any health related questions or concerns.  The care management  team will reach out to the patient again over the next 45 days.      Care Plan : Diabetes Type 2 (Adult)  Updates made by Dannielle Karvonen, RN since 09/12/2020 12:00 AM   Problem: Glycemic Management (Diabetes, Type 2)   Priority: High   Long-Range Goal: Glycemic Management Optimized   Start Date: 08/12/2020  Expected End Date: 12/06/2020  This Visit's Progress: On track  Recent Progress: On track  Priority: High  Objective:  Lab Results  Component Value Date   HGBA1C 9.9 (H) 06/11/2020   Lab Results  Component Value Date   CREATININE 1.53 (H) 06/18/2020   CREATININE 1.75 (H) 06/11/2020   CREATININE 1.23 (H) 05/30/2020   Lab Results  Component Value Date   EGFR 49 (L) 05/30/2020  Current Barriers: Patient reports today's fasting blood sugar is 169. Patient states her doctor changed her Trulicity dose to 21m 1 time per week. She states she has started taking the new dose.  Patient reports her blood sugar range over the last 3-4 weeks has been 128 - 213.  Patient denies any low blood sugars < 70.   Case Manager Clinical Goal(s):  patient will demonstrate improved adherence to prescribed treatment plan for diabetes self care/management as evidenced by: daily monitoring and recording of CBG  adherence to ADA/ carb modified diet exercise 3 days/week adherence to prescribed medication regimen contacting provider for new or worsened symptoms or questions Interventions:  Collaboration with DTonia Ghent MD regarding development and update of comprehensive plan of care as evidenced by provider attestation and co-signature Inter-disciplinary care team collaboration (see longitudinal plan of care) Reviewed medications with patient and discussed importance of medication adherence Discussed plans with patient for ongoing care management follow up and provided patient with direct contact information for care management team Provided patient with written educational materials related to hypo and  hyperglycemia and importance of correct treatment Reviewed scheduled/upcoming provider appointments:  Patient reports next follow up visit with her primary care provider is 09/20/2020.  RNCM will resend patient exercise booklet. Self-Care Activities - Self administers oral medications as prescribed Self administers injectable DM medication as prescribed Attends all scheduled provider appointments Checks blood  sugars as prescribed and utilize hyper and hypoglycemia protocol as needed Adheres to prescribed ADA/carb modified Patient Goals: - Continue to check blood sugar at prescribed times.  Check blood sugar if you feel it is too high or too low and take your blood sugar readings or monitor to your doctor appointment.  - Plan to eat low carbohydrate and low salt meals, watch portion sizes and avoid sugar sweetened drinks.  - continue to follow a realistic goal ( set small achieving goals for weight lost, example 5 lbs in 1 month, meet that goal then set another attainable goal) - Incorporate exercise into your daily routine. (review exercise booklet regarding chair exercises sent to you in the mail) Follow Up Plan: The patient has been provided with contact information for the care management team and has been advised to call with any health related questions or concerns.  The care management team will reach out to the patient again over the next 45 days.       Plan:The patient has been provided with contact information for the care management team and has been advised to call with any health related questions or concerns.  and The care management team will reach out to the patient again over the next 45 days. Quinn Plowman RN,BSN,CCM RN Case Manager [place]  [phone]

## 2020-09-13 ENCOUNTER — Other Ambulatory Visit: Payer: Self-pay

## 2020-09-13 MED ORDER — TRESIBA FLEXTOUCH 200 UNIT/ML ~~LOC~~ SOPN: PEN_INJECTOR | SUBCUTANEOUS | 1 refills | Status: DC

## 2020-09-13 NOTE — Telephone Encounter (Signed)
Patient called stating she needs refill on Tresiba re sent to   [facility] in [place], it went to mail order before.  Next appointment on 09/20/20 with Dr [name].

## 2020-09-18 ENCOUNTER — Encounter: Payer: Self-pay | Admitting: Family Medicine

## 2020-09-18 ENCOUNTER — Telehealth: Payer: Medicare HMO | Admitting: Physician Assistant

## 2020-09-18 ENCOUNTER — Other Ambulatory Visit: Payer: Medicare HMO

## 2020-09-18 DIAGNOSIS — J449 Chronic obstructive pulmonary disease, unspecified: Secondary | ICD-10-CM | POA: Diagnosis not present

## 2020-09-18 DIAGNOSIS — J441 Chronic obstructive pulmonary disease with (acute) exacerbation: Secondary | ICD-10-CM | POA: Diagnosis not present

## 2020-09-18 MED ORDER — PREDNISONE 20 MG PO TABS: 40.0000 mg | ORAL_TABLET | Freq: Every day | ORAL | 0 refills | Status: DC

## 2020-09-18 NOTE — Telephone Encounter (Signed)
Will route to manager here at the office now for review

## 2020-09-18 NOTE — Progress Notes (Signed)
Visit for Asthma  Based on what you have shared with me, it looks like you may have a flare up of your asthma.  Asthma is a chronic (ongoing) lung disease which results in airway obstruction, inflammation and hyper-responsiveness.   Asthma symptoms vary from person to person, with common symptoms including nighttime awakening and decreased ability to participate in normal activities as a result of shortness of breath. It is often triggered by changes in weather, changes in the season, changes in air temperature, or inside (home, school, daycare or work) allergens such as animal dander, mold, mildew, woodstoves or cockroaches.   It can also be triggered by hormonal changes, extreme emotion, physical exertion or an upper respiratory tract illness.     It is important to identify the trigger, and then eliminate or avoid the trigger if possible.   If you have been prescribed medications to be taken on a regular basis, it is important to follow the asthma action plan and to follow guidelines to adjust medication in response to increasing symptoms of decreased peak expiratory flow rate  Treatment: I have prescribed: Prednisone 21m by mouth per day for 5 - 7 days  I know you have taken a COVID test which was negative. However if symptoms are not significantly improving or you develop any other new/worsening symptoms -- fever, chills, aches, cough, chest pain -- I recommend ER evaluation for full assessment and repeat COVID testing.   HOME CARE Only take medications as instructed by your medical team. Consider wearing a mask or scarf to improve breathing air temperature have been shown to decrease irritation and decrease exacerbations Get rest. Taking a steamy shower or using a humidifier may help nasal congestion sand ease sore throat pain. You can place a towel over your head and breathe in  the steam from hot water coming from a faucet. Using a saline nasal spray works much the same way.  Cough drops, hare candies and sore throat lozenges may ease your cough.  Avoid close contacts especially the very you and the elderly Cover your mouth if you cough or sneeze Always remember to wash your hands.    GET HELP RIGHT AWAY IF: You develop worsening symptoms; breathlessness at rest, drowsy, confused or agitated, unable to speak in full sentences You have coughing fits You develop a severe headache or visual changes You develop shortness of breath, difficulty breathing or start having chest pain Your symptoms persist after you have completed your treatment plan If your symptoms do not improve within 10 days  MAKE SURE YOU Understand these instructions. Will watch your condition. Will get help right away if you are not doing well or get worse.   Your e-visit answers were reviewed by a board certified advanced clinical practitioner to complete your personal care plan, Depending upon the condition, your plan could have included both over the counter or prescription medications.   Please review your pharmacy choice. Your safety is important to [place] If you have drug allergies check your prescription carefully.  You can use MyChart to ask questions about today's visit, request a non-urgent  call back, or ask for a work or school excuse for 24 hours related to this e-Visit. If it has been greater than 24 hours you will need to follow up with your provider, or enter a new e-Visit to address those concerns.   You will get an e-mail in the next two days asking about your experience. I hope that your e-visit has been valuable  and will speed your recovery. Thank you for using e-visits.

## 2020-09-18 NOTE — Progress Notes (Signed)
I have spent 5 minutes in review of e-visit questionnaire, review and updating patient chart, medical decision making and response to patient.   Shaleah Nissley Cody Atia Haupt, PA-C    

## 2020-09-18 NOTE — Telephone Encounter (Signed)
Kathleen Hatfield (front office rep) communicated with patient to not come in for labs if she is having sick symptoms. Patient will update tomorrow regarding her symptoms.

## 2020-09-19 ENCOUNTER — Ambulatory Visit
Admission: RE | Admit: 2020-09-19 | Discharge: 2020-09-19 | Disposition: A | Discharge status: Other Acute Inpatient Hospital | Payer: Medicare HMO | Source: Ambulatory Visit | Attending: Family Medicine | Admitting: Family Medicine

## 2020-09-19 ENCOUNTER — Inpatient Hospital Stay (HOSPITAL_COMMUNITY)
Admission: EM | Admit: 2020-09-19 | Discharge: 2020-09-22 | DRG: 871 | Disposition: A | Discharge status: Home / Self Care | Payer: Medicare HMO | Source: Ambulatory Visit | Attending: Internal Medicine | Admitting: Internal Medicine

## 2020-09-19 ENCOUNTER — Emergency Department (HOSPITAL_COMMUNITY): Payer: Medicare HMO

## 2020-09-19 ENCOUNTER — Other Ambulatory Visit: Payer: Self-pay

## 2020-09-19 ENCOUNTER — Encounter (HOSPITAL_COMMUNITY): Payer: Self-pay

## 2020-09-19 ENCOUNTER — Telehealth: Payer: Medicare HMO | Admitting: Physician Assistant

## 2020-09-19 VITALS — BP 145/65 | HR 94 | Temp 98.4°F | Resp 24

## 2020-09-19 DIAGNOSIS — K219 Gastro-esophageal reflux disease without esophagitis: Secondary | ICD-10-CM | POA: Diagnosis present

## 2020-09-19 DIAGNOSIS — R7989 Other specified abnormal findings of blood chemistry: Secondary | ICD-10-CM | POA: Diagnosis not present

## 2020-09-19 DIAGNOSIS — J45901 Unspecified asthma with (acute) exacerbation: Secondary | ICD-10-CM | POA: Diagnosis not present

## 2020-09-19 DIAGNOSIS — J189 Pneumonia, unspecified organism: Secondary | ICD-10-CM

## 2020-09-19 DIAGNOSIS — E1169 Type 2 diabetes mellitus with other specified complication: Secondary | ICD-10-CM | POA: Diagnosis not present

## 2020-09-19 DIAGNOSIS — M109 Gout, unspecified: Secondary | ICD-10-CM | POA: Diagnosis present

## 2020-09-19 DIAGNOSIS — Z825 Family history of asthma and other chronic lower respiratory diseases: Secondary | ICD-10-CM

## 2020-09-19 DIAGNOSIS — F32A Depression, unspecified: Secondary | ICD-10-CM | POA: Diagnosis present

## 2020-09-19 DIAGNOSIS — Z743 Need for continuous supervision: Secondary | ICD-10-CM | POA: Diagnosis not present

## 2020-09-19 DIAGNOSIS — J9601 Acute respiratory failure with hypoxia: Secondary | ICD-10-CM | POA: Diagnosis present

## 2020-09-19 DIAGNOSIS — G2581 Restless legs syndrome: Secondary | ICD-10-CM | POA: Diagnosis present

## 2020-09-19 DIAGNOSIS — I5032 Chronic diastolic (congestive) heart failure: Secondary | ICD-10-CM | POA: Diagnosis present

## 2020-09-19 DIAGNOSIS — E785 Hyperlipidemia, unspecified: Secondary | ICD-10-CM | POA: Diagnosis not present

## 2020-09-19 DIAGNOSIS — Z885 Allergy status to narcotic agent status: Secondary | ICD-10-CM

## 2020-09-19 DIAGNOSIS — G629 Polyneuropathy, unspecified: Secondary | ICD-10-CM | POA: Diagnosis not present

## 2020-09-19 DIAGNOSIS — R0602 Shortness of breath: Secondary | ICD-10-CM | POA: Diagnosis not present

## 2020-09-19 DIAGNOSIS — E1122 Type 2 diabetes mellitus with diabetic chronic kidney disease: Secondary | ICD-10-CM | POA: Diagnosis present

## 2020-09-19 DIAGNOSIS — J440 Chronic obstructive pulmonary disease with acute lower respiratory infection: Secondary | ICD-10-CM | POA: Diagnosis present

## 2020-09-19 DIAGNOSIS — Z90710 Acquired absence of both cervix and uterus: Secondary | ICD-10-CM

## 2020-09-19 DIAGNOSIS — Z882 Allergy status to sulfonamides status: Secondary | ICD-10-CM

## 2020-09-19 DIAGNOSIS — Z6841 Body Mass Index (BMI) 40.0 and over, adult: Secondary | ICD-10-CM | POA: Diagnosis not present

## 2020-09-19 DIAGNOSIS — G4733 Obstructive sleep apnea (adult) (pediatric): Secondary | ICD-10-CM

## 2020-09-19 DIAGNOSIS — M1990 Unspecified osteoarthritis, unspecified site: Secondary | ICD-10-CM | POA: Diagnosis not present

## 2020-09-19 DIAGNOSIS — Z91018 Allergy to other foods: Secondary | ICD-10-CM

## 2020-09-19 DIAGNOSIS — I130 Hypertensive heart and chronic kidney disease with heart failure and stage 1 through stage 4 chronic kidney disease, or unspecified chronic kidney disease: Secondary | ICD-10-CM | POA: Diagnosis present

## 2020-09-19 DIAGNOSIS — Z79899 Other long term (current) drug therapy: Secondary | ICD-10-CM

## 2020-09-19 DIAGNOSIS — I5030 Unspecified diastolic (congestive) heart failure: Secondary | ICD-10-CM | POA: Diagnosis present

## 2020-09-19 DIAGNOSIS — Z794 Long term (current) use of insulin: Secondary | ICD-10-CM

## 2020-09-19 DIAGNOSIS — E782 Mixed hyperlipidemia: Secondary | ICD-10-CM | POA: Diagnosis present

## 2020-09-19 DIAGNOSIS — J441 Chronic obstructive pulmonary disease with (acute) exacerbation: Secondary | ICD-10-CM | POA: Diagnosis not present

## 2020-09-19 DIAGNOSIS — R0902 Hypoxemia: Secondary | ICD-10-CM | POA: Diagnosis not present

## 2020-09-19 DIAGNOSIS — Z20822 Contact with and (suspected) exposure to covid-19: Secondary | ICD-10-CM | POA: Diagnosis not present

## 2020-09-19 DIAGNOSIS — Z888 Allergy status to other drugs, medicaments and biological substances status: Secondary | ICD-10-CM

## 2020-09-19 DIAGNOSIS — A419 Sepsis, unspecified organism: Secondary | ICD-10-CM | POA: Diagnosis not present

## 2020-09-19 DIAGNOSIS — N189 Chronic kidney disease, unspecified: Secondary | ICD-10-CM | POA: Diagnosis not present

## 2020-09-19 DIAGNOSIS — R0600 Dyspnea, unspecified: Secondary | ICD-10-CM

## 2020-09-19 DIAGNOSIS — E1165 Type 2 diabetes mellitus with hyperglycemia: Secondary | ICD-10-CM | POA: Diagnosis not present

## 2020-09-19 DIAGNOSIS — Z8249 Family history of ischemic heart disease and other diseases of the circulatory system: Secondary | ICD-10-CM

## 2020-09-19 DIAGNOSIS — Z791 Long term (current) use of non-steroidal anti-inflammatories (NSAID): Secondary | ICD-10-CM

## 2020-09-19 DIAGNOSIS — E669 Obesity, unspecified: Secondary | ICD-10-CM | POA: Diagnosis not present

## 2020-09-19 DIAGNOSIS — Z881 Allergy status to other antibiotic agents status: Secondary | ICD-10-CM

## 2020-09-19 DIAGNOSIS — Z9049 Acquired absence of other specified parts of digestive tract: Secondary | ICD-10-CM

## 2020-09-19 DIAGNOSIS — I10 Essential (primary) hypertension: Secondary | ICD-10-CM | POA: Diagnosis present

## 2020-09-19 DIAGNOSIS — J4551 Severe persistent asthma with (acute) exacerbation: Secondary | ICD-10-CM

## 2020-09-19 DIAGNOSIS — R059 Cough, unspecified: Secondary | ICD-10-CM | POA: Diagnosis not present

## 2020-09-19 DIAGNOSIS — K760 Fatty (change of) liver, not elsewhere classified: Secondary | ICD-10-CM | POA: Diagnosis not present

## 2020-09-19 DIAGNOSIS — Z7952 Long term (current) use of systemic steroids: Secondary | ICD-10-CM

## 2020-09-19 DIAGNOSIS — Z87892 Personal history of anaphylaxis: Secondary | ICD-10-CM

## 2020-09-19 LAB — BLOOD GAS, ARTERIAL
Acid-Base Excess: 5.9 mmol/L — ABNORMAL HIGH (ref 0.0–2.0)
Bicarbonate: 30.1 mmol/L — ABNORMAL HIGH (ref 20.0–28.0)
FIO2: 36
O2 Saturation: 98.4 %
Patient temperature: 98.6
pCO2 arterial: 43.2 mmHg (ref 32.0–48.0)
pH, Arterial: 7.457 — ABNORMAL HIGH (ref 7.350–7.450)
pO2, Arterial: 125 mmHg — ABNORMAL HIGH (ref 83.0–108.0)

## 2020-09-19 LAB — COMPREHENSIVE METABOLIC PANEL
ALT: 17 U/L (ref 0–44)
AST: 18 U/L (ref 15–41)
Albumin: 3.6 g/dL (ref 3.5–5.0)
Alkaline Phosphatase: 84 U/L (ref 38–126)
Anion gap: 11 (ref 5–15)
BUN: 26 mg/dL — ABNORMAL HIGH (ref 8–23)
CO2: 30 mmol/L (ref 22–32)
Calcium: 9 mg/dL (ref 8.9–10.3)
Chloride: 95 mmol/L — ABNORMAL LOW (ref 98–111)
Creatinine, Ser: 1.15 mg/dL — ABNORMAL HIGH (ref 0.44–1.00)
GFR, Estimated: 53 mL/min — ABNORMAL LOW (ref >60)
Glucose, Bld: 260 mg/dL — ABNORMAL HIGH (ref 70–99)
Potassium: 3.4 mmol/L — ABNORMAL LOW (ref 3.5–5.1)
Sodium: 136 mmol/L (ref 135–145)
Total Bilirubin: 0.5 mg/dL (ref 0.3–1.2)
Total Protein: 7.2 g/dL (ref 6.5–8.1)

## 2020-09-19 LAB — CBC WITH DIFFERENTIAL/PLATELET
Abs Immature Granulocytes: 0.14 10*3/uL — ABNORMAL HIGH (ref 0.00–0.07)
Basophils Absolute: 0.1 10*3/uL (ref 0.0–0.1)
Basophils Relative: 0 %
Eosinophils Absolute: 0.1 10*3/uL (ref 0.0–0.5)
Eosinophils Relative: 1 %
HCT: 37.9 % (ref 36.0–46.0)
Hemoglobin: 12.4 g/dL (ref 12.0–15.0)
Immature Granulocytes: 1 %
Lymphocytes Relative: 24 %
Lymphs Abs: 4.2 10*3/uL — ABNORMAL HIGH (ref 0.7–4.0)
MCH: 30.5 pg (ref 26.0–34.0)
MCHC: 32.7 g/dL (ref 30.0–36.0)
MCV: 93.1 fL (ref 80.0–100.0)
Monocytes Absolute: 1.5 10*3/uL — ABNORMAL HIGH (ref 0.1–1.0)
Monocytes Relative: 9 %
Neutro Abs: 11.4 10*3/uL — ABNORMAL HIGH (ref 1.7–7.7)
Neutrophils Relative %: 65 %
Platelets: 292 10*3/uL (ref 150–400)
RBC: 4.07 MIL/uL (ref 3.87–5.11)
RDW: 14.8 % (ref 11.5–15.5)
WBC: 17.4 10*3/uL — ABNORMAL HIGH (ref 4.0–10.5)
nRBC: 0 % (ref 0.0–0.2)

## 2020-09-19 LAB — RESP PANEL BY RT-PCR (FLU A&B, COVID) ARPGX2
Influenza A by PCR: NEGATIVE
Influenza B by PCR: NEGATIVE
SARS Coronavirus 2 by RT PCR: NEGATIVE

## 2020-09-19 LAB — BRAIN NATRIURETIC PEPTIDE: B Natriuretic Peptide: 79.8 pg/mL (ref 0.0–100.0)

## 2020-09-19 LAB — D-DIMER, QUANTITATIVE: D-Dimer, Quant: 0.62 ug/mL-FEU — ABNORMAL HIGH (ref 0.00–0.50)

## 2020-09-19 LAB — TROPONIN I (HIGH SENSITIVITY): Troponin I (High Sensitivity): 9 ng/L (ref <18)

## 2020-09-19 MED ORDER — ROPINIROLE HCL ER 4 MG PO TB24
4.0000 mg | ORAL_TABLET | Freq: Once | ORAL | Status: AC
  Administered 2020-09-19: 4 mg via ORAL
  Filled 2020-09-19: qty 1

## 2020-09-19 MED ORDER — ONDANSETRON HCL 4 MG/2ML IJ SOLN
4.0000 mg | Freq: Once | INTRAMUSCULAR | Status: AC
  Administered 2020-09-19: 4 mg via INTRAVENOUS
  Filled 2020-09-19: qty 2

## 2020-09-19 MED ORDER — SODIUM CHLORIDE 0.9 % IV SOLN
100.0000 mg | Freq: Once | INTRAVENOUS | Status: AC
  Administered 2020-09-19: 100 mg via INTRAVENOUS
  Filled 2020-09-19: qty 100

## 2020-09-19 MED ORDER — SODIUM CHLORIDE (PF) 0.9 % IJ SOLN
INTRAMUSCULAR | Status: AC
  Filled 2020-09-19: qty 50

## 2020-09-19 MED ORDER — SODIUM CHLORIDE 0.9 % IV SOLN
1.0000 g | Freq: Once | INTRAVENOUS | Status: AC
  Administered 2020-09-19: 1 g via INTRAVENOUS
  Filled 2020-09-19: qty 10

## 2020-09-19 MED ORDER — IOHEXOL 350 MG/ML SOLN
100.0000 mL | Freq: Once | INTRAVENOUS | Status: AC | PRN
  Administered 2020-09-19: 100 mL via INTRAVENOUS

## 2020-09-19 MED ORDER — IPRATROPIUM-ALBUTEROL 0.5-2.5 (3) MG/3ML IN SOLN
3.0000 mL | Freq: Once | RESPIRATORY_TRACT | Status: AC
  Administered 2020-09-19: 3 mL via RESPIRATORY_TRACT
  Filled 2020-09-19: qty 3

## 2020-09-19 NOTE — ED Triage Notes (Signed)
EMS reports coming from Urgent Care sent for hypoxia SOB. Symptoms stared Monday with cough and SOB, progressively worsening, productive cough started today. Hx Asthma, COPD, CHF.   BP 148/74 HR 90 RR 24 Sp02 89 RA  22ga R hand  2gms Mag 177m Solumedrol 10 albuterol 147mAtrovent enroute

## 2020-09-19 NOTE — ED Notes (Signed)
EMS called to transport pt to ED for further eval

## 2020-09-19 NOTE — ED Triage Notes (Signed)
SOB starting Monday, placed on prednisone yesterday. Coughing up green sputum this morning. Tachypnic with labored breathing in triage, no audible wheezing. Hx of asthma. Says this happened last year, was placed in hospital and put on antibiotics.

## 2020-09-19 NOTE — Progress Notes (Signed)
Based on what you shared with me, I feel your condition warrants further evaluation and I recommend that you be seen in a face to face visit.   If you are not improving with oral steroids, you should be evaluated in person. You may require a chest xray and/or breathing treatments.  NOTE: There will be NO CHARGE for this eVisit   If you are having a true medical emergency please call 911.      For an urgent face to face visit, [place] has six urgent care centers for your convenience:     [facility] [place] Get Driving Directions [phone] [place], [place] [address]    [facility]) Get Driving Directions [phone] [place], [place] [address]  [facility]) Get Driving Directions [phone] [address] [place],  [place]  [address]  [facility] Get Driving Directions [phone] [place], [place], [place] [address]   [facility] Get Driving Directions  [phone] [address] [place], [place] [address]   [facility] [place] Get Driving Directions [phone] [address], [place],  [address]  Your MyChart E-visit questionnaire answers were reviewed by a board certified advanced clinical practitioner to complete your personal care plan based on your specific symptoms.  Thank you for using e-Visits.   I provided 5 minutes of non face-to-face time during this encounter for chart review and documentation.

## 2020-09-19 NOTE — ED Notes (Signed)
[facility] given report.

## 2020-09-19 NOTE — Discharge Instructions (Signed)
TO ED via EMS for further evaluation and treatment

## 2020-09-19 NOTE — ED Notes (Signed)
[facility] called for transport. 

## 2020-09-19 NOTE — ED Provider Notes (Signed)
[facility]    CSN: [id number] Arrival date & time: 09/19/20  1348      History   Chief Complaint Chief Complaint  Patient presents with   Shortness of Breath    HPI Kathleen Hatfield is a 65 y.o. female.   Subjective:   Kathleen Hatfield is a 65 y.o. female with a medical history significant for chronic diastolic heart failure, asthma, obstructive sleep apnea on CPAP, morbid obesity, hypertension, diabetes mellitus on insulin and hyperlipidemia that presents with for evaluation and treatment of shortness of breath. Symptoms occur at rest and is worse with minimal exertion. Symptoms began 4 days ago, gradually worsening since. Associated symptoms include difficulty breathing, dyspnea on exertion, dyspnea when laying down, productive cough, tightness in chest, and wheezing. She denies edema, hemoptysis, hoarseness, fevers, or recent URI. She has not had recent travel. Weight has been stable. Symptoms are exacerbated by minimal activity. Symptoms are alleviated by nothing despite frequent use of nebulizer/MDI.  She had a virtual visit on yesterday and was prescribed a steroid Dosepak.  She called again today and was referred to be seen in person.  She is a nonsmoker. No history of COVID. No known exposure to COVID. Patient has been vaccinated against COVID. No boosters.     Past Medical History:  Diagnosis Date   Anemia    Arthritis    Asthma    Chronic diastolic CHF 28/7681   Echocardiogram 05/2019: EF 70, no RWMA, mild LVH, Gr 1 DD, normal RVSF, severe LVH, borderline asc Aorta (39 mm)   CKD (chronic kidney disease)    Depression    Diabetes mellitus without complication (HCC)    Diverticulitis    Dyspnea    with exertion   GERD (gastroesophageal reflux disease)    History of blood transfusion    Hyperlipidemia    cannot tolerate statins   Hypertension    patient states she has never had high blood pressure.    Nuclear stress test    Myoview 05/2019: EF 83, no ischemia or  infarction; Low Risk   Peripheral neuropathy    Pneumonia    PONV (postoperative nausea and vomiting)    RLS (restless legs syndrome)    Sleep apnea    uses CPAP   Ulcerative colitis [facility])    dr Collene Mares    Patient Active Problem List   Diagnosis Date Noted   Pain of foot 06/20/2020   Medicare annual wellness visit, initial 06/20/2020   Constipation 06/11/2020   Diverticulosis of colon 06/11/2020   Irritable bowel syndrome 15/72/6203   Periumbilical pain 55/97/4163   RLS (restless legs syndrome)    Acute asthma exacerbation 01/09/2020   Asthma exacerbation 01/08/2020   Muscle weakness 01/08/2020   Grade I diastolic dysfunction 84/53/6468   Statin myopathy 10/10/2019   Advance care planning 06/14/2019   RLQ abdominal pain 06/14/2019   COPD (chronic obstructive pulmonary disease) ([place]) 06/14/2019   Leukocytosis 06/14/2019   Hypercalcemia 06/14/2019   Gout 06/14/2019   COPD with acute exacerbation ([place]) 05/03/2017   Acute renal failure superimposed on stage 2 chronic kidney disease ([place]) 04/02/2016   Diabetes mellitus without complication (HCC)    CKD (chronic kidney disease)    Hypersomnia with sleep apnea 09/16/2015   Fatigue 06/07/2015   Morbid obesity ([place]) 06/07/2015   Chronic diastolic heart failure (HCC)    OSA (obstructive sleep apnea) 08/23/2012   Hypertension    Depression    GERD (gastroesophageal reflux disease)  Hyperlipidemia    Ulcerative colitis ([place])    Patellar tendinitis 05/25/2011   Knee pain 05/25/2011   Myofascial pain 05/25/2011   Cervicalgia 05/25/2011    Past Surgical History:  Procedure Laterality Date   ABDOMINAL HYSTERECTOMY     APPENDECTOMY     BREAST BIOPSY     BREAST SURGERY     left biopsy   CARDIAC CATHETERIZATION     CESAREAN SECTION     CHOLECYSTECTOMY     HERNIA REPAIR     INSERTION OF MESH N/A 11/15/2015   Procedure: INSERTION OF MESH;  Surgeon: [name], MD;  Location: [facility];  Service: General;  Laterality: N/A;    LAPAROSCOPIC LYSIS OF ADHESIONS N/A 11/15/2015   Procedure: LAPAROSCOPIC LYSIS OF ADHESIONS;  Surgeon: [name], MD;  Location: [facility];  Service: General;  Laterality: N/A;   RIGHT HEART CATHETERIZATION N/A 02/27/2013   Procedure: RIGHT HEART CATH;  Surgeon: Blane Ohara, MD;  Location: [facility] CATH LAB;  Service: Cardiovascular;  Laterality: N/A;   RIGHT HEART CATHETERIZATION N/A 12/14/2013   Procedure: RIGHT HEART CATH;  Surgeon: Larey Dresser, MD;  Location: [facility] CATH LAB;  Service: Cardiovascular;  Laterality: N/A;   SIGMOIDECTOMY  2010   diverticular disease   VENTRAL HERNIA REPAIR N/A 11/15/2015   Procedure: LAPAROSCOPIC VENTRAL WALL HERNIA REPAIR;  Surgeon: [name], MD;  Location: [facility];  Service: General;  Laterality: N/A;    OB History   No obstetric history on file.      Home Medications    Prior to Admission medications   Medication Sig Start Date End Date Taking? Authorizing Provider  acetaminophen (TYLENOL) 500 MG tablet Take 1,000 mg by mouth every 6 (six) hours as needed for pain.    [provider]  albuterol (VENTOLIN HFA) 108 (90 Base) MCG/ACT inhaler Inhale 2 puffs into the lungs every 6 (six) hours as needed. 02/16/20   [name], MD  allopurinol (ZYLOPRIM) 100 MG tablet Take 0.5 tablets (50 mg total) by mouth daily. 08/18/20   [name], MD  allopurinol (ZYLOPRIM) 100 MG tablet allopurinol 100 mg tablet  TAKE 1 TABLET BY MOUTH EVERY DAY FOR 30 DAYS    [provider]  BAYER CONTOUR NEXT TEST test strip 1 each by Other route daily. As directed 07/30/15   [provider]  budesonide (PULMICORT) 0.5 MG/2ML nebulizer solution Take 2 mLs (0.5 mg total) by nebulization 2 (two) times daily. 05/23/20   [name], MD  buPROPion (WELLBUTRIN XL) 300 MG 24 hr tablet TAKE 1 TABLET BY MOUTH DAILY 09/28/19   [name], MD  Cholecalciferol (VITAMIN D-3) 5000 UNITS TABS Take 5,000 Units by mouth every other day.     [provider]  colchicine 0.6 MG tablet Take 1 tablet (0.6 mg total) by mouth daily as needed (for gout). 08/15/20   [name], MD  dicyclomine (BENTYL) 20 MG tablet TAKE 1 TABLET UP TO FOUR TIMES A DAY FOR GI CRAMPING, PAIN, AND NAUSEA/VOMITING AS NEEDED 11/13/19   [name], MD  Dulaglutide (TRULICITY) 3 RC/7.8LF SOPN Inject 3 mg as directed once a week. 09/03/20   [name], MD  DULoxetine (CYMBALTA) 60 MG capsule TAKE 1 CAPSULE BY MOUTH ONCE DAILY. 03/20/20   [name], MD  furosemide (LASIX) 20 MG tablet Take 3 tablets (60 mg total) by mouth 2 (two) times daily. 05/13/20   Richardson Dopp T, PA-C  gabapentin (NEURONTIN) 100  MG capsule TAKE 3 CAPSULES BY MOUTH AT BEDTIME. 03/20/20   [name], MD  GLOBAL EASE INJECT PEN NEEDLES 32G X 4 MM MISC USE AS DIRECTED WITH VICTOZA AND TRESIBA. 08/12/20   [name], MD  insulin degludec (TRESIBA FLEXTOUCH) 200 UNIT/ML FlexTouch Pen INJECT 88 UNITS UNDER SKIN ONCE DAILY 09/13/20   [name], MD  ipratropium-albuterol (DUONEB) 0.5-2.5 (3) MG/3ML SOLN Take 3 mLs by nebulization See admin instructions. Nebulize 3 ml's and inhale into the lungs two to three times a day 05/25/19   [provider]  ketoconazole (NIZORAL) 2 % cream Apply 1 application topically 2 (two) times daily. 09/03/20   [name], MD  meloxicam (MOBIC) 15 MG tablet TAKE 1 TABLET (15 MG TOTAL) BY MOUTH DAILY. 08/26/20   Edrick Kins, DPM  mesalamine (LIALDA) 1.2 g EC tablet Take 1.2 g by mouth daily with breakfast. Take 1.2 grams by mouth once a day with breakfast- increase as directed    [provider]  metolazone (ZAROXOLYN) 5 MG tablet TAKE 1 TABLET BY MOUTH AS NEEDED. 08/19/20   [name], MD  MICROLET LANCETS MISC 1 each by Other route. As directed 07/30/15   [provider]  ondansetron (ZOFRAN) 4 MG tablet Take 1 tablet (4 mg total) by mouth every 6 (six) hours. 05/23/20   Faustino Congress, NP  pantoprazole  (PROTONIX) 40 MG tablet TAKE 1 TABLET BY MOUTH AT BEDTIME 08/21/20   [name], MD  polyethylene glycol (MIRALAX / Floria Raveling) packet Take 17 g by mouth daily as needed for mild constipation (MIX AND DRINK AS DIRECTED).     [provider]  potassium chloride (MICRO-K) 10 MEQ CR capsule TAKE 5 CAPSULES (50 MEQ TOTAL) BY MOUTH 2 (TWO) TIMES DAILY. 07/03/20   Richardson Dopp T, PA-C  predniSONE (DELTASONE) 20 MG tablet Take 2 tablets (40 mg total) by mouth daily with breakfast. 09/18/20   Brunetta Jeans, PA-C  propranolol (INDERAL) 10 MG tablet TAKE 1 TABLET BY MOUTH 2 TIMES DAILY 03/20/20   [name], MD  revefenacin (YUPELRI) 175 MCG/3ML nebulizer solution Take 3 mLs (175 mcg total) by nebulization daily. 05/20/20   [name], MD  rOPINIRole (REQUIP) 4 MG tablet TAKE 1 TABLET BY MOUTH TWICE DAILY 03/20/20   [name], MD  spironolactone (ALDACTONE) 25 MG tablet TAKE 1 TABLET BY MOUTH DAILY. 07/31/20   [name], MD  traMADol (ULTRAM) 50 MG tablet Take 1 tablet (50 mg total) by mouth 3 (three) times daily as needed. 06/18/20   [name], MD  UNABLE TO FIND CPAP- At bedtime    [provider]    Family History Family History  Problem Relation Age of Onset   Heart disease Mother    Hypertension Mother    Dementia Mother    Heart disease Father    COPD Father    Hypertension Father    Heart disease Brother    Other Brother        knee replacement; hip replacement   Other Brother        cant brathe when laying down   Breast cancer Neg Hx    Colon cancer Neg Hx     Social History Social History   Tobacco Use   Smoking status: Never   Smokeless tobacco: Never  Vaping Use   Vaping Use: Never used  Substance Use Topics   Alcohol use: No   Drug use:  No     Allergies   Almond oil, Morphine and related, Atorvastatin, Ceclor [cefaclor], Ciprofloxacin, Levaquin [levofloxacin], Losartan, Statins, and Sulfa antibiotics   Review of  Systems Review of Systems  Constitutional:  Positive for fatigue. Negative for fever.  HENT:  Negative for congestion.   Respiratory:  Positive for cough, shortness of breath and wheezing.   Cardiovascular:  Negative for chest pain, palpitations and leg swelling.  Gastrointestinal:  Negative for nausea and vomiting.    Physical Exam Triage Vital Signs ED Triage Vitals [09/19/20 1403]  Enc Vitals Group     BP 96/75     Pulse Rate 94     Resp (!) 24     Temp 98.4 F (36.9 C)     Temp Source Oral     SpO2 90 %     Weight      Height      Head Circumference      Peak Flow      Pain Score 7     Pain Loc      Pain Edu?      Excl. in [place]?    No data found.  Updated Vital Signs BP (!) 145/65 (BP Location: Left Arm)   Pulse 94   Temp 98.4 F (36.9 C) (Oral)   Resp (!) 24   SpO2 90%   Visual Acuity Right Eye Distance:   Left Eye Distance:   Bilateral Distance:    Right Eye Near:   Left Eye Near:    Bilateral Near:     Physical Exam Vitals reviewed.  Constitutional:      General: She is not in acute distress.    Appearance: She is well-developed. She is ill-appearing. She is not toxic-appearing or diaphoretic.  HENT:     Head: Normocephalic.  Pulmonary:     Effort: Pulmonary effort is normal. Tachypnea present. No respiratory distress.     Breath sounds: Decreased breath sounds present. No wheezing.  Chest:     Chest wall: No tenderness.  Abdominal:     Palpations: Abdomen is soft.  Musculoskeletal:        General: Normal range of motion.  Skin:    General: Skin is warm and dry.  Neurological:     General: No focal deficit present.     Mental Status: She is alert.  Psychiatric:        Mood and Affect: Mood normal.        Behavior: Behavior normal.     UC Treatments / Results  Labs (all labs ordered are listed, but only abnormal results are displayed) Labs Reviewed - No data to display  EKG   Radiology No results found.  Procedures Procedures  (including critical care time)  Medications Ordered in UC Medications - No data to display  Initial Impression / Assessment and Plan / UC Course  I have reviewed the triage vital signs and the nursing notes.  Pertinent labs & imaging results that were available during my care of the patient were reviewed by me and considered in my medical decision making (see chart for details).     65 yo female with a medical history significant for chronic diastolic heart failure, asthma, obstructive sleep apnea on CPAP, morbid obesity, hypertension, diabetes mellitus on insulin and hyperlipidemia that presents with for evaluation and treatment of shortness of breath.  Onset of symptoms was 4 days ago but has progressively worsened.  No improvement in symptoms despite frequent use of her nebulizer/MDI.  Patient is alert and oriented x3.  Nontoxic but acutely ill appearing.  Afebrile.  Saturations 89 to 90% on room air. Tachypnea.  Decreased air movement throughout.  No acute distress.  Placed on 2 L oxygen via nasal cannula. EMS called for transport to ED.  Discussed diagnosis, concerns and need for emergent ED evaluation patient and spouse.  Both parties agreeable to EMS transport to ED for further evaluation/treatment.  Patient left the urgent care with EMS in stable condition.  This care was provided during an unprecedented National Emergency due to the Novel Coronavirus (COVID-19) pandemic. COVID-19 infections and transmission risks place heavy strains on healthcare resources.  As this pandemic evolves, our facility, providers, and staff strive to respond fluidly, to remain operational, and to provide care relative to available resources and information. Outcomes are unpredictable and treatments are without well-defined guidelines. Further, the impact of COVID-19 on all aspects of urgent care, including the impact to patients seeking care for reasons other than COVID-19, is unavoidable during this national  emergency. At this time of the global pandemic, management of patients has significantly changed, even for non-COVID positive patients given high local and regional COVID volumes at this time requiring high healthcare system and resource utilization. The standard of care for management of both COVID suspected and non-COVID suspected patients continues to change rapidly at the local, regional, national, and global levels. This patient was worked up and treated to the best available but ever changing evidence and resources available at this current time.   Documentation was completed with the aid of voice recognition software. Transcription may contain typographical errors.  Final Clinical Impressions(s) / UC Diagnoses   Final diagnoses:  Dyspnea, unspecified type  Asthma, chronic obstructive, with acute exacerbation [facility])     Discharge Instructions      TO ED via EMS for further evaluation and treatment      ED Prescriptions   None    PDMP not reviewed this encounter.   Enrique Sack, [place] 09/19/20 1446

## 2020-09-19 NOTE — ED Provider Notes (Signed)
[facility] DEPT Provider Note   CSN: [id number] Arrival date & time: 09/19/20  1518     History Chief Complaint  Patient presents with   Shortness of Breath    Kathleen Hatfield is a 65 y.o. female.  Kathleen Hatfield has a history of COPD, not on home O2.  For the last few days, she has been experiencing shortness of breath.  She has been using her inhalers.  She had a video visit with her primary care provider yesterday, and she was prescribed oral steroids.  Today, she presented to urgent care and was noted to have an oxygen saturation of 89% on room air.  She was transported via EMS to the emergency department.  In route, she received 2 g of magnesium, 125 mg of Solu-Medrol, and a DuoNeb inhaler.  EMS reports that she appears clinically better.  Initial lung exam was very diminished throughout, but now she has more air movement and more wheezing.  She has a history of mild diastolic dysfunction.  She had a normal stress test in March 2021.  The history is provided by the patient and the EMS personnel.  Shortness of Breath Severity:  Severe Onset quality:  Gradual Duration:  3 days Timing:  Constant Progression:  Worsening Chronicity:  New Context: not URI   Relieved by:  Nothing Exacerbated by: supine position. Ineffective treatments:  Inhaler Associated symptoms: chest pain (right-sided), cough, sputum production (gray) and wheezing   Associated symptoms: no abdominal pain, no ear pain, no fever, no headaches, no rash, no sore throat and no vomiting       Past Medical History:  Diagnosis Date   Anemia    Arthritis    Asthma    Chronic diastolic CHF 59/5638   Echocardiogram 05/2019: EF 70, no RWMA, mild LVH, Gr 1 DD, normal RVSF, severe LVH, borderline asc Aorta (39 mm)   CKD (chronic kidney disease)    Depression    Diabetes mellitus without complication (HCC)    Diverticulitis    Dyspnea    with exertion   GERD (gastroesophageal reflux  disease)    History of blood transfusion    Hyperlipidemia    cannot tolerate statins   Hypertension    patient states she has never had high blood pressure.    Nuclear stress test    Myoview 05/2019: EF 83, no ischemia or infarction; Low Risk   Peripheral neuropathy    Pneumonia    PONV (postoperative nausea and vomiting)    RLS (restless legs syndrome)    Sleep apnea    uses CPAP   Ulcerative colitis [facility])    dr Collene Mares    Patient Active Problem List   Diagnosis Date Noted   Pain of foot 06/20/2020   Medicare annual wellness visit, initial 06/20/2020   Constipation 06/11/2020   Diverticulosis of colon 06/11/2020   Irritable bowel syndrome 75/64/3329   Periumbilical pain 51/88/4166   RLS (restless legs syndrome)    Acute asthma exacerbation 01/09/2020   Asthma exacerbation 01/08/2020   Muscle weakness 01/08/2020   Grade I diastolic dysfunction 09/06/1599   Statin myopathy 10/10/2019   Advance care planning 06/14/2019   RLQ abdominal pain 06/14/2019   COPD (chronic obstructive pulmonary disease) ([place]) 06/14/2019   Leukocytosis 06/14/2019   Hypercalcemia 06/14/2019   Gout 06/14/2019   COPD with acute exacerbation ([place]) 05/03/2017   Acute renal failure superimposed on stage 2 chronic kidney disease ([place]) 04/02/2016   Diabetes mellitus without  complication ([place])    CKD (chronic kidney disease)    Hypersomnia with sleep apnea 09/16/2015   Fatigue 06/07/2015   Morbid obesity ([place]) 06/07/2015   Chronic diastolic heart failure (HCC)    OSA (obstructive sleep apnea) 08/23/2012   Hypertension    Depression    GERD (gastroesophageal reflux disease)    Hyperlipidemia    Ulcerative colitis ([place])    Patellar tendinitis 05/25/2011   Knee pain 05/25/2011   Myofascial pain 05/25/2011   Cervicalgia 05/25/2011    Past Surgical History:  Procedure Laterality Date   ABDOMINAL HYSTERECTOMY     APPENDECTOMY     BREAST BIOPSY     BREAST SURGERY     left biopsy   CARDIAC  CATHETERIZATION     CESAREAN SECTION     CHOLECYSTECTOMY     HERNIA REPAIR     INSERTION OF MESH N/A 11/15/2015   Procedure: INSERTION OF MESH;  Surgeon: [name], MD;  Location: [facility] ORS;  Service: General;  Laterality: N/A;   LAPAROSCOPIC LYSIS OF ADHESIONS N/A 11/15/2015   Procedure: LAPAROSCOPIC LYSIS OF ADHESIONS;  Surgeon: [name], MD;  Location: [facility] ORS;  Service: General;  Laterality: N/A;   RIGHT HEART CATHETERIZATION N/A 02/27/2013   Procedure: RIGHT HEART CATH;  Surgeon: Blane Ohara, MD;  Location: [facility] CATH LAB;  Service: Cardiovascular;  Laterality: N/A;   RIGHT HEART CATHETERIZATION N/A 12/14/2013   Procedure: RIGHT HEART CATH;  Surgeon: Larey Dresser, MD;  Location: [facility] CATH LAB;  Service: Cardiovascular;  Laterality: N/A;   SIGMOIDECTOMY  2010   diverticular disease   VENTRAL HERNIA REPAIR N/A 11/15/2015   Procedure: LAPAROSCOPIC VENTRAL WALL HERNIA REPAIR;  Surgeon: [name], MD;  Location: [facility] ORS;  Service: General;  Laterality: N/A;     OB History   No obstetric history on file.     Family History  Problem Relation Age of Onset   Heart disease Mother    Hypertension Mother    Dementia Mother    Heart disease Father    COPD Father    Hypertension Father    Heart disease Brother    Other Brother        knee replacement; hip replacement   Other Brother        cant brathe when laying down   Breast cancer Neg Hx    Colon cancer Neg Hx     Social History   Tobacco Use   Smoking status: Never   Smokeless tobacco: Never  Vaping Use   Vaping Use: Never used  Substance Use Topics   Alcohol use: No   Drug use: No    Home Medications Prior to Admission medications   Medication Sig Start Date End Date Taking? Authorizing Provider  acetaminophen (TYLENOL) 500 MG tablet Take 1,000 mg by mouth every 6 (six) hours as needed for pain.    [provider]  albuterol (VENTOLIN HFA) 108 (90 Base) MCG/ACT inhaler Inhale 2 puffs into the lungs  every 6 (six) hours as needed. 02/16/20   [name], MD  allopurinol (ZYLOPRIM) 100 MG tablet Take 0.5 tablets (50 mg total) by mouth daily. 08/18/20   [name], MD  allopurinol (ZYLOPRIM) 100 MG tablet allopurinol 100 mg tablet  TAKE 1 TABLET BY MOUTH EVERY DAY FOR 30 DAYS    [provider]  BAYER CONTOUR NEXT TEST test strip 1 each by Other route daily. As directed 07/30/15   [provider]  budesonide (  PULMICORT) 0.5 MG/2ML nebulizer solution Take 2 mLs (0.5 mg total) by nebulization 2 (two) times daily. 05/23/20   [name], MD  buPROPion (WELLBUTRIN XL) 300 MG 24 hr tablet TAKE 1 TABLET BY MOUTH DAILY 09/28/19   [name], MD  Cholecalciferol (VITAMIN D-3) 5000 UNITS TABS Take 5,000 Units by mouth every other day.     [provider]  colchicine 0.6 MG tablet Take 1 tablet (0.6 mg total) by mouth daily as needed (for gout). 08/15/20   [name], MD  dicyclomine (BENTYL) 20 MG tablet TAKE 1 TABLET UP TO FOUR TIMES A DAY FOR GI CRAMPING, PAIN, AND NAUSEA/VOMITING AS NEEDED 11/13/19   [name], MD  Dulaglutide (TRULICITY) 3 GN/0.0BB SOPN Inject 3 mg as directed once a week. 09/03/20   [name], MD  DULoxetine (CYMBALTA) 60 MG capsule TAKE 1 CAPSULE BY MOUTH ONCE DAILY. 03/20/20   [name], MD  furosemide (LASIX) 20 MG tablet Take 3 tablets (60 mg total) by mouth 2 (two) times daily. 05/13/20   Richardson Dopp T, PA-C  gabapentin (NEURONTIN) 100 MG capsule TAKE 3 CAPSULES BY MOUTH AT BEDTIME. 03/20/20   [name], MD  GLOBAL EASE INJECT PEN NEEDLES 32G X 4 MM MISC USE AS DIRECTED WITH VICTOZA AND TRESIBA. 08/12/20   [name], MD  insulin degludec (TRESIBA FLEXTOUCH) 200 UNIT/ML FlexTouch Pen INJECT 88 UNITS UNDER SKIN ONCE DAILY 09/13/20   [name], MD  ipratropium-albuterol (DUONEB) 0.5-2.5 (3) MG/3ML SOLN Take 3 mLs by nebulization See admin instructions. Nebulize 3 ml's and inhale into the lungs two to three  times a day 05/25/19   [provider]  ketoconazole (NIZORAL) 2 % cream Apply 1 application topically 2 (two) times daily. 09/03/20   [name], MD  meloxicam (MOBIC) 15 MG tablet TAKE 1 TABLET (15 MG TOTAL) BY MOUTH DAILY. 08/26/20   Edrick Kins, DPM  mesalamine (LIALDA) 1.2 g EC tablet Take 1.2 g by mouth daily with breakfast. Take 1.2 grams by mouth once a day with breakfast- increase as directed    [provider]  metolazone (ZAROXOLYN) 5 MG tablet TAKE 1 TABLET BY MOUTH AS NEEDED. 08/19/20   [name], MD  MICROLET LANCETS MISC 1 each by Other route. As directed 07/30/15   [provider]  ondansetron (ZOFRAN) 4 MG tablet Take 1 tablet (4 mg total) by mouth every 6 (six) hours. 05/23/20   Faustino Congress, NP  pantoprazole (PROTONIX) 40 MG tablet TAKE 1 TABLET BY MOUTH AT BEDTIME 08/21/20   [name], MD  polyethylene glycol (MIRALAX / Floria Raveling) packet Take 17 g by mouth daily as needed for mild constipation (MIX AND DRINK AS DIRECTED).     [provider]  potassium chloride (MICRO-K) 10 MEQ CR capsule TAKE 5 CAPSULES (50 MEQ TOTAL) BY MOUTH 2 (TWO) TIMES DAILY. 07/03/20   Richardson Dopp T, PA-C  predniSONE (DELTASONE) 20 MG tablet Take 2 tablets (40 mg total) by mouth daily with breakfast. 09/18/20   Brunetta Jeans, PA-C  propranolol (INDERAL) 10 MG tablet TAKE 1 TABLET BY MOUTH 2 TIMES DAILY 03/20/20   [name], MD  revefenacin (YUPELRI) 175 MCG/3ML nebulizer solution Take 3 mLs (175 mcg total) by nebulization daily. 05/20/20   [name], MD  rOPINIRole (REQUIP) 4 MG tablet TAKE 1 TABLET BY MOUTH TWICE DAILY 03/20/20   [name], MD  spironolactone (ALDACTONE) 25 MG tablet  TAKE 1 TABLET BY MOUTH DAILY. 07/31/20   [name], MD  traMADol (ULTRAM) 50 MG tablet Take 1 tablet (50 mg total) by mouth 3 (three) times daily as needed. 06/18/20   [name], MD  UNABLE TO FIND CPAP- At bedtime    [provider]    Allergies    Almond oil, Morphine and related, Atorvastatin, Ceclor [cefaclor], Ciprofloxacin, Levaquin [levofloxacin], Losartan, Statins, and Sulfa antibiotics  Review of Systems   Review of Systems  Constitutional:  Negative for chills and fever.  HENT:  Negative for ear pain and sore throat.   Eyes:  Negative for pain and visual disturbance.  Respiratory:  Positive for cough, sputum production (gray), shortness of breath and wheezing.   Cardiovascular:  Positive for chest pain (right-sided). Negative for palpitations.  Gastrointestinal:  Negative for abdominal pain and vomiting.  Genitourinary:  Negative for dysuria and hematuria.  Musculoskeletal:  Negative for arthralgias and back pain.  Skin:  Negative for color change and rash.  Neurological:  Negative for seizures, syncope and headaches.  All other systems reviewed and are negative.  Physical Exam Updated Vital Signs BP (!) 188/88   Pulse 89   Temp 98.1 F (36.7 C) (Oral)   Resp (!) 24   Physical Exam Vitals and nursing note reviewed.  Constitutional:      Appearance: She is well-developed.  HENT:     Head: Normocephalic and atraumatic.  Cardiovascular:     Rate and Rhythm: Normal rate and regular rhythm.     Heart sounds: Normal heart sounds.  Pulmonary:     Effort: Pulmonary effort is normal. Tachypnea present.     Breath sounds: Examination of the right-upper field reveals wheezing. Examination of the left-upper field reveals wheezing. Examination of the right-middle field reveals wheezing. Examination of the left-middle field reveals wheezing. Examination of the right-lower field reveals wheezing. Examination of the left-lower field reveals wheezing. Wheezing present. No decreased breath sounds.  Abdominal:     Palpations: Abdomen is soft.     Tenderness: There is no abdominal tenderness.  Musculoskeletal:     Right lower leg: No edema.     Left lower leg: No edema.  Skin:    General: Skin is warm and  dry.  Neurological:     General: No focal deficit present.     Mental Status: She is alert and oriented to person, place, and time.  Psychiatric:        Mood and Affect: Mood normal.        Behavior: Behavior normal.    ED Results / Procedures / Treatments   Labs (all labs ordered are listed, but only abnormal results are displayed) Labs Reviewed  CBC WITH DIFFERENTIAL/PLATELET - Abnormal; Notable for the following components:      Result Value   WBC 17.4 (*)    Neutro Abs 11.4 (*)    Lymphs Abs 4.2 (*)    Monocytes Absolute 1.5 (*)    Abs Immature Granulocytes 0.14 (*)    All other components within normal limits  BLOOD GAS, ARTERIAL - Abnormal; Notable for the following components:   pH, Arterial 7.457 (*)    pO2, Arterial 125 (*)    Bicarbonate 30.1 (*)    Acid-Base Excess 5.9 (*)    All other components within normal limits  D-DIMER, QUANTITATIVE - Abnormal; Notable for the following components:   D-Dimer, Quant 0.62 (*)    All other components within normal limits  COMPREHENSIVE  METABOLIC PANEL - Abnormal; Notable for the following components:   Potassium 3.4 (*)    Chloride 95 (*)    Glucose, Bld 260 (*)    BUN 26 (*)    Creatinine, Ser 1.15 (*)    GFR, Estimated 53 (*)    All other components within normal limits  RESP PANEL BY RT-PCR (FLU A&B, COVID) ARPGX2  BRAIN NATRIURETIC PEPTIDE  TROPONIN I (HIGH SENSITIVITY)    EKG EKG Interpretation  Date/Time:  Thursday September 19 2020 18:14:27 EDT Ventricular Rate:  89 PR Interval:  199 QRS Duration: 129 QT Interval:  409 QTC Calculation: 498 R Axis:   -36 Text Interpretation: Sinus rhythm Right bundle branch block Baseline wander in lead(s) V6 No acute ischemia Confirmed by Lorre Munroe ([id number]) on 09/19/2020 6:41:24 PM  Radiology CT Angio Chest PE W and/or Wo Contrast  Result Date: 09/19/2020 CLINICAL DATA:  Shortness of breath and cough with positive D-dimer, initial encounter EXAM: CT ANGIOGRAPHY CHEST WITH  CONTRAST TECHNIQUE: Multidetector CT imaging of the chest was performed using the standard protocol during bolus administration of intravenous contrast. Multiplanar CT image reconstructions and MIPs were obtained to evaluate the vascular anatomy. CONTRAST:  181m OMNIPAQUE IOHEXOL 350 MG/ML SOLN COMPARISON:  Chest x-ray from earlier in the same day. FINDINGS: Cardiovascular: Thoracic aorta and its branches demonstrate atherosclerotic calcification without aneurysmal dilatation. No evidence of dissection is seen. No cardiac enlargement is noted. The pulmonary artery is well visualized bilaterally within normal branching pattern. No definitive intraluminal filling defect to suggest pulmonary embolism is seen. Mediastinum/Nodes: Thoracic inlet is within normal limits. No sizable hilar or mediastinal adenopathy is noted. The esophagus as visualized is within normal limits. Lungs/Pleura: Lungs are well aerated bilaterally. Patchy airspace opacity is noted in the lingula and left lower lobe as well as in the right upper lobe along the minor fissure anteriorly in the right upper lobe. Right upper lobe changes suggests the possibility of pulmonary infarct although no discrete sizable embolus is noted. Mild emphysematous changes are noted. No sizable effusion is seen. No sizable parenchymal nodule is noted. Upper Abdomen: Visualized upper abdomen shows fatty infiltration of the liver. No other focal abnormality is noted. Musculoskeletal: Degenerative changes of the thoracic spine are seen. No acute bony abnormality is noted. Review of the MIP images confirms the above findings. IMPRESSION: No definitive pulmonary emboli are seen. Patchy airspace opacity is noted bilaterally likely representing multifocal infiltrate. The right upper lobe changes mimic a pulmonary infarct although no definitive embolus is seen. Fatty liver. Aortic Atherosclerosis (ICD10-I70.0) and Emphysema (ICD10-J43.9). Electronically Signed   By: MInez CatalinaM.D.   On: 09/19/2020 19:28   DG Chest Port 1 View  Result Date: 09/19/2020 CLINICAL DATA:  Shortness of breath.  Hypoxia. EXAM: PORTABLE CHEST 1 VIEW COMPARISON:  01/08/2020 FINDINGS: Focal airspace consolidation noted peripheral right mid lung along the minor fissure laterally. There is also new left base collapse/consolidative opacity. No evidence for pleural effusion. The cardio pericardial silhouette is enlarged. The visualized bony structures of the thorax show no acute abnormality. IMPRESSION: New bilateral airspace disease, left greater than right suggesting multifocal pneumonia. Imaging follow-up recommended to ensure resolution. Electronically Signed   By: EMisty StanleyM.D.   On: 09/19/2020 17:07    Procedures Procedures   Medications Ordered in ED Medications  ipratropium-albuterol (DUONEB) 0.5-2.5 (3) MG/3ML nebulizer solution 3 mL (3 mLs Nebulization Given 09/19/20 1758)  doxycycline (VIBRAMYCIN) 100 mg in sodium chloride 0.9 % 250 mL IVPB (0  mg Intravenous Stopped 09/19/20 2126)  cefTRIAXone (ROCEPHIN) 1 g in sodium chloride 0.9 % 100 mL IVPB (0 g Intravenous Stopped 09/19/20 1905)  ondansetron (ZOFRAN) injection 4 mg (4 mg Intravenous Given 09/19/20 1817)  iohexol (OMNIPAQUE) 350 MG/ML injection 100 mL (100 mLs Intravenous Contrast Given 09/19/20 1830)  sodium chloride (PF) 0.9 % injection (  Given by Other 09/19/20 1922)  rOPINIRole (REQUIP XL) 24 hr tablet 4 mg (4 mg Oral Given 09/19/20 2023)    ED Course  I have reviewed the triage vital signs and the nursing notes.  Pertinent labs & imaging results that were available during my care of the patient were reviewed by me and considered in my medical decision making (see chart for details).  Clinical Course as of 09/20/20 0000  Thu Sep 19, 2020  2046 I spoke with Dr. Kermit Balo with [facility] who will admit. [AW]    Clinical Course User Index [AW] Arnaldo Natal, MD   MDM Rules/Calculators/A&P                          Eveline Keto presents with shortness of breath and cough.  She was noted to be hypoxic and was placed on oxygen.  She was treated for a potential COPD exacerbation but was found to have multifocal pneumonia.  Surprisingly, her COVID-19 test was negative.  She was treated for community-acquired pneumonia and will be admitted secondary to her O2 requirement.  She was also evaluated for evidence of ACS, PE. Final Clinical Impression(s) / ED Diagnoses Final diagnoses:  Community acquired pneumonia, unspecified laterality  COPD exacerbation ([place])  Acute respiratory failure with hypoxia ([place])    Rx / DC Orders ED Discharge Orders     None        Arnaldo Natal, MD 09/20/20 0002

## 2020-09-19 NOTE — ED Notes (Signed)
Patient requesting Requip medication and food. Dr. Joya Gaskins notified.

## 2020-09-19 NOTE — ED Notes (Signed)
Attempted to call report to 17M, Nurse needs to call me back, unavailable at this time.

## 2020-09-20 ENCOUNTER — Ambulatory Visit: Payer: Medicare HMO | Admitting: Family Medicine

## 2020-09-20 DIAGNOSIS — J45901 Unspecified asthma with (acute) exacerbation: Secondary | ICD-10-CM

## 2020-09-20 DIAGNOSIS — J441 Chronic obstructive pulmonary disease with (acute) exacerbation: Secondary | ICD-10-CM | POA: Diagnosis not present

## 2020-09-20 DIAGNOSIS — J189 Pneumonia, unspecified organism: Secondary | ICD-10-CM

## 2020-09-20 DIAGNOSIS — E1165 Type 2 diabetes mellitus with hyperglycemia: Secondary | ICD-10-CM

## 2020-09-20 DIAGNOSIS — E782 Mixed hyperlipidemia: Secondary | ICD-10-CM

## 2020-09-20 DIAGNOSIS — K219 Gastro-esophageal reflux disease without esophagitis: Secondary | ICD-10-CM | POA: Diagnosis not present

## 2020-09-20 DIAGNOSIS — I10 Essential (primary) hypertension: Secondary | ICD-10-CM

## 2020-09-20 DIAGNOSIS — G4733 Obstructive sleep apnea (adult) (pediatric): Secondary | ICD-10-CM

## 2020-09-20 DIAGNOSIS — Z9989 Dependence on other enabling machines and devices: Secondary | ICD-10-CM

## 2020-09-20 DIAGNOSIS — E1169 Type 2 diabetes mellitus with other specified complication: Secondary | ICD-10-CM | POA: Diagnosis not present

## 2020-09-20 DIAGNOSIS — I5032 Chronic diastolic (congestive) heart failure: Secondary | ICD-10-CM

## 2020-09-20 LAB — GLUCOSE, CAPILLARY
Glucose-Capillary: 364 mg/dL — ABNORMAL HIGH (ref 70–99)
Glucose-Capillary: 431 mg/dL — ABNORMAL HIGH (ref 70–99)
Glucose-Capillary: 319 mg/dL — ABNORMAL HIGH (ref 70–99)
Glucose-Capillary: 275 mg/dL — ABNORMAL HIGH (ref 70–99)
Glucose-Capillary: 413 mg/dL — ABNORMAL HIGH (ref 70–99)

## 2020-09-20 LAB — COMPREHENSIVE METABOLIC PANEL
ALT: 23 U/L (ref 0–44)
AST: 21 U/L (ref 15–41)
Albumin: 3.3 g/dL — ABNORMAL LOW (ref 3.5–5.0)
Alkaline Phosphatase: 83 U/L (ref 38–126)
Anion gap: 11 (ref 5–15)
BUN: 22 mg/dL (ref 8–23)
CO2: 29 mmol/L (ref 22–32)
Calcium: 9.5 mg/dL (ref 8.9–10.3)
Chloride: 96 mmol/L — ABNORMAL LOW (ref 98–111)
Creatinine, Ser: 1.42 mg/dL — ABNORMAL HIGH (ref 0.44–1.00)
GFR, Estimated: 41 mL/min — ABNORMAL LOW (ref >60)
Glucose, Bld: 345 mg/dL — ABNORMAL HIGH (ref 70–99)
Potassium: 4.7 mmol/L (ref 3.5–5.1)
Sodium: 136 mmol/L (ref 135–145)
Total Bilirubin: 0.4 mg/dL (ref 0.3–1.2)
Total Protein: 7.2 g/dL (ref 6.5–8.1)

## 2020-09-20 LAB — CBC WITH DIFFERENTIAL/PLATELET
Abs Immature Granulocytes: 0.16 10*3/uL — ABNORMAL HIGH (ref 0.00–0.07)
Basophils Absolute: 0 10*3/uL (ref 0.0–0.1)
Basophils Relative: 0 %
Eosinophils Absolute: 0 10*3/uL (ref 0.0–0.5)
Eosinophils Relative: 0 %
HCT: 38.7 % (ref 36.0–46.0)
Hemoglobin: 13 g/dL (ref 12.0–15.0)
Immature Granulocytes: 1 %
Lymphocytes Relative: 10 %
Lymphs Abs: 1.6 10*3/uL (ref 0.7–4.0)
MCH: 30.9 pg (ref 26.0–34.0)
MCHC: 33.6 g/dL (ref 30.0–36.0)
MCV: 91.9 fL (ref 80.0–100.0)
Monocytes Absolute: 0.6 10*3/uL (ref 0.1–1.0)
Monocytes Relative: 4 %
Neutro Abs: 14.6 10*3/uL — ABNORMAL HIGH (ref 1.7–7.7)
Neutrophils Relative %: 85 %
Platelets: 274 10*3/uL (ref 150–400)
RBC: 4.21 MIL/uL (ref 3.87–5.11)
RDW: 14.6 % (ref 11.5–15.5)
WBC: 17 10*3/uL — ABNORMAL HIGH (ref 4.0–10.5)
nRBC: 0 % (ref 0.0–0.2)

## 2020-09-20 LAB — STREP PNEUMONIAE URINARY ANTIGEN: Strep Pneumo Urinary Antigen: NEGATIVE

## 2020-09-20 LAB — MAGNESIUM: Magnesium: 2.2 mg/dL (ref 1.7–2.4)

## 2020-09-20 LAB — HEMOGLOBIN A1C
Hgb A1c MFr Bld: 8.5 % — ABNORMAL HIGH (ref 4.8–5.6)
Mean Plasma Glucose: 197.25 mg/dL

## 2020-09-20 LAB — MRSA NEXT GEN BY PCR, NASAL: MRSA by PCR Next Gen: DETECTED — AB

## 2020-09-20 MED ORDER — METHYLPREDNISOLONE SODIUM SUCC 125 MG IJ SOLR
60.0000 mg | Freq: Two times a day (BID) | INTRAMUSCULAR | Status: DC
  Administered 2020-09-20 – 2020-09-22 (×6): 60 mg via INTRAVENOUS
  Filled 2020-09-20 (×6): qty 2

## 2020-09-20 MED ORDER — PROPRANOLOL HCL 20 MG PO TABS
10.0000 mg | ORAL_TABLET | Freq: Every day | ORAL | Status: DC
  Administered 2020-09-20 – 2020-09-21 (×3): 10 mg via ORAL
  Filled 2020-09-20 (×3): qty 1

## 2020-09-20 MED ORDER — BUDESONIDE 0.5 MG/2ML IN SUSP
0.5000 mg | Freq: Two times a day (BID) | RESPIRATORY_TRACT | Status: DC
  Administered 2020-09-20 – 2020-09-22 (×5): 0.5 mg via RESPIRATORY_TRACT
  Filled 2020-09-20 (×5): qty 2

## 2020-09-20 MED ORDER — ACETAMINOPHEN 325 MG PO TABS
650.0000 mg | ORAL_TABLET | Freq: Four times a day (QID) | ORAL | Status: DC | PRN
  Administered 2020-09-20 – 2020-09-21 (×5): 650 mg via ORAL
  Filled 2020-09-20 (×5): qty 2

## 2020-09-20 MED ORDER — AZITHROMYCIN 500 MG PO TABS
500.0000 mg | ORAL_TABLET | Freq: Every day | ORAL | Status: DC
  Administered 2020-09-20 – 2020-09-22 (×3): 500 mg via ORAL
  Filled 2020-09-20 (×3): qty 1

## 2020-09-20 MED ORDER — IPRATROPIUM-ALBUTEROL 0.5-2.5 (3) MG/3ML IN SOLN
3.0000 mL | Freq: Four times a day (QID) | RESPIRATORY_TRACT | Status: DC
  Administered 2020-09-20 – 2020-09-21 (×6): 3 mL via RESPIRATORY_TRACT
  Filled 2020-09-20 (×5): qty 3

## 2020-09-20 MED ORDER — ONDANSETRON HCL 4 MG PO TABS: 4.0000 mg | ORAL_TABLET | Freq: Four times a day (QID) | ORAL | Status: DC | PRN

## 2020-09-20 MED ORDER — COLCHICINE 0.6 MG PO TABS
0.6000 mg | ORAL_TABLET | Freq: Every day | ORAL | Status: DC | PRN
  Filled 2020-09-20: qty 1

## 2020-09-20 MED ORDER — BUPROPION HCL ER (XL) 150 MG PO TB24
300.0000 mg | ORAL_TABLET | Freq: Every day | ORAL | Status: DC
  Administered 2020-09-20 – 2020-09-22 (×3): 300 mg via ORAL
  Filled 2020-09-20 (×3): qty 2

## 2020-09-20 MED ORDER — ROPINIROLE HCL 1 MG PO TABS
4.0000 mg | ORAL_TABLET | Freq: Two times a day (BID) | ORAL | Status: DC
  Administered 2020-09-20 – 2020-09-22 (×5): 4 mg via ORAL
  Filled 2020-09-20 (×5): qty 4

## 2020-09-20 MED ORDER — ACETAMINOPHEN 650 MG RE SUPP: 650.0000 mg | Freq: Four times a day (QID) | RECTAL | Status: DC | PRN

## 2020-09-20 MED ORDER — IPRATROPIUM-ALBUTEROL 0.5-2.5 (3) MG/3ML IN SOLN
3.0000 mL | RESPIRATORY_TRACT | Status: DC | PRN
  Administered 2020-09-21: 3 mL via RESPIRATORY_TRACT
  Filled 2020-09-20 (×2): qty 3

## 2020-09-20 MED ORDER — INSULIN GLARGINE 100 UNIT/ML ~~LOC~~ SOLN
88.0000 [IU] | Freq: Every day | SUBCUTANEOUS | Status: DC
  Administered 2020-09-20 – 2020-09-22 (×3): 88 [IU] via SUBCUTANEOUS
  Filled 2020-09-20 (×3): qty 0.88

## 2020-09-20 MED ORDER — FUROSEMIDE 40 MG PO TABS
60.0000 mg | ORAL_TABLET | Freq: Two times a day (BID) | ORAL | Status: DC
  Administered 2020-09-20 – 2020-09-22 (×5): 60 mg via ORAL
  Filled 2020-09-20 (×5): qty 1

## 2020-09-20 MED ORDER — CHLORHEXIDINE GLUCONATE CLOTH 2 % EX PADS
6.0000 | MEDICATED_PAD | Freq: Every day | CUTANEOUS | Status: DC
  Administered 2020-09-20 – 2020-09-22 (×3): 6 via TOPICAL

## 2020-09-20 MED ORDER — REVEFENACIN 175 MCG/3ML IN SOLN: 175.0000 ug | Freq: Every day | RESPIRATORY_TRACT | Status: DC

## 2020-09-20 MED ORDER — ONDANSETRON HCL 4 MG/2ML IJ SOLN: 4.0000 mg | Freq: Four times a day (QID) | INTRAMUSCULAR | Status: DC | PRN

## 2020-09-20 MED ORDER — PANTOPRAZOLE SODIUM 40 MG PO TBEC
40.0000 mg | DELAYED_RELEASE_TABLET | Freq: Every day | ORAL | Status: DC
  Administered 2020-09-20 – 2020-09-21 (×3): 40 mg via ORAL
  Filled 2020-09-20 (×3): qty 1

## 2020-09-20 MED ORDER — ALLOPURINOL 100 MG PO TABS
50.0000 mg | ORAL_TABLET | Freq: Every day | ORAL | Status: DC
  Administered 2020-09-20 – 2020-09-22 (×3): 50 mg via ORAL
  Filled 2020-09-20 (×3): qty 1

## 2020-09-20 MED ORDER — SODIUM CHLORIDE 0.9 % IV SOLN
500.0000 mg | INTRAVENOUS | Status: DC
  Filled 2020-09-20: qty 500

## 2020-09-20 MED ORDER — DULOXETINE HCL 60 MG PO CPEP
60.0000 mg | ORAL_CAPSULE | Freq: Every day | ORAL | Status: DC
  Administered 2020-09-21 – 2020-09-22 (×2): 60 mg via ORAL
  Filled 2020-09-20 (×3): qty 1

## 2020-09-20 MED ORDER — SODIUM CHLORIDE 0.9 % IV SOLN
2.0000 g | INTRAVENOUS | Status: DC
  Administered 2020-09-20 – 2020-09-22 (×3): 2 g via INTRAVENOUS
  Filled 2020-09-20 (×3): qty 20

## 2020-09-20 MED ORDER — ENOXAPARIN SODIUM 40 MG/0.4ML IJ SOSY
40.0000 mg | PREFILLED_SYRINGE | INTRAMUSCULAR | Status: DC
  Administered 2020-09-20 – 2020-09-22 (×3): 40 mg via SUBCUTANEOUS
  Filled 2020-09-20 (×3): qty 0.4

## 2020-09-20 MED ORDER — GABAPENTIN 300 MG PO CAPS
300.0000 mg | ORAL_CAPSULE | Freq: Every day | ORAL | Status: DC
  Administered 2020-09-20 – 2020-09-21 (×3): 300 mg via ORAL
  Filled 2020-09-20 (×3): qty 1

## 2020-09-20 MED ORDER — GUAIFENESIN-DM 100-10 MG/5ML PO SYRP
5.0000 mL | ORAL_SOLUTION | ORAL | Status: DC | PRN
  Administered 2020-09-20 – 2020-09-21 (×2): 5 mL via ORAL
  Filled 2020-09-20 (×2): qty 5

## 2020-09-20 MED ORDER — INSULIN ASPART 100 UNIT/ML IJ SOLN
0.0000 [IU] | Freq: Three times a day (TID) | INTRAMUSCULAR | Status: DC
  Administered 2020-09-20 (×2): 15 [IU] via SUBCUTANEOUS
  Administered 2020-09-20: 11 [IU] via SUBCUTANEOUS
  Administered 2020-09-20: 8 [IU] via SUBCUTANEOUS
  Administered 2020-09-21: 11 [IU] via SUBCUTANEOUS
  Administered 2020-09-21: 15 [IU] via SUBCUTANEOUS
  Administered 2020-09-21: 8 [IU] via SUBCUTANEOUS
  Administered 2020-09-21: 11 [IU] via SUBCUTANEOUS
  Administered 2020-09-22: 15 [IU] via SUBCUTANEOUS
  Administered 2020-09-22: 8 [IU] via SUBCUTANEOUS

## 2020-09-20 MED ORDER — TRAMADOL HCL 50 MG PO TABS
50.0000 mg | ORAL_TABLET | Freq: Three times a day (TID) | ORAL | Status: DC | PRN
  Filled 2020-09-20: qty 1

## 2020-09-20 MED ORDER — POLYETHYLENE GLYCOL 3350 17 G PO PACK: 17.0000 g | PACK | Freq: Every day | ORAL | Status: DC | PRN

## 2020-09-20 MED ORDER — SPIRONOLACTONE 25 MG PO TABS
25.0000 mg | ORAL_TABLET | Freq: Every day | ORAL | Status: DC
  Administered 2020-09-20 – 2020-09-22 (×3): 25 mg via ORAL
  Filled 2020-09-20 (×3): qty 1

## 2020-09-20 MED ORDER — MUPIROCIN 2 % EX OINT
1.0000 "application " | TOPICAL_OINTMENT | Freq: Two times a day (BID) | CUTANEOUS | Status: DC
  Administered 2020-09-20 – 2020-09-22 (×5): 1 via NASAL
  Filled 2020-09-20 (×2): qty 22

## 2020-09-20 NOTE — Progress Notes (Addendum)
New Admission Note:   Arrival Method: via [facility] from [facility] long Mental Orientation: alert and oriented x4 Telemetry: 5M06, CCMD notified Assessment: to be completed Skin: Intact, MASD under the breastleft and right. IV: RAC and R hand, nsl Pain: 0/10 Tubes: None Safety Measures: Safety Fall Prevention Plan has been discussed  Admission: to be completed 5 [place] Orientation: Patient has been oriented to the room, unit and staff.   Family: none at bedside  Orders to be reviewed and implemented. Will continue to monitor the patient. Call light has been placed within reach and bed alarm has been activated.

## 2020-09-20 NOTE — H&P (Signed)
History and Physical    Kathleen Hatfield JSE:[id number] DOB: 1955-03-17 DOA: 09/19/2020  PCP: [name], MD  Patient coming from: Home   Chief Complaint:  Chief Complaint  Patient presents with   Shortness of Breath     HPI:    65 year old female with past medical history of COPD, asthma, diastolic congestive heart failure (Echo 05/2019 EF 70% with G1DD), OSA on CPAP, hypertension, obesity, insulin-dependent diabetes mellitus type 2, hyperlipidemia, gout, gastroesophageal reflux disease who presents to [facility] emergency department with complaints of shortness of breath and cough.  Patient explains that approximately 5 days ago she began to experience a cough.  This cough is initially mild and nonproductive but progressively worsening day by day.  As patient's cough continued to worsen patient began to develop associated shortness of breath.  Patient states that her shortness of breath is moderate to severe in intensity worse with exertion and improved with rest.  Patient also complains of associated right-sided chest pain, sharp in quality occurring with deep inspiration and cough.  Patient symptoms continue to worsen until she eventually had a telemedicine visit with her primary care provider on 7/13 during which she was prescribed a course of oral prednisone.  Patient explains that despite taking prednisone and her home bronchodilator therapy she continued to clinically worsen.  Patient began to develop associated generalized weakness and poor appetite.  Patient denies sick contacts recent travel or contact with confirmed COVID-19 infection.  Patient's symptoms continue to worsen until the patient eventually presented to [facility] emergency department for evaluation.  Upon evaluation in the emergency department patient was found to be hypoxic with oxygen saturations in the upper 80s requiring initiation of submental oxygen via nasal cannula.  Patient was  administered bronchodilator therapy.  D-dimer was found to be slightly elevated and therefore patient underwent CT angiogram of the chest.  This was negative for pulmonary embolism but did reveal a multifocal pneumonia.  Patient was initiated on intravenous ceftriaxone and doxycycline in the hospital scrip was then called to assess the patient for admission to the hospital  Review of Systems:   Review of Systems  Respiratory:  Positive for cough and shortness of breath.   Cardiovascular:  Positive for chest pain.  Neurological:  Positive for weakness.  All other systems reviewed and are negative.  Past Medical History:  Diagnosis Date   Anemia    Arthritis    Asthma    Chronic diastolic CHF 09/3708   Echocardiogram 05/2019: EF 70, no RWMA, mild LVH, Gr 1 DD, normal RVSF, severe LVH, borderline asc Aorta (39 mm)   CKD (chronic kidney disease)    Depression    Diabetes mellitus without complication (HCC)    Diverticulitis    Dyspnea    with exertion   GERD (gastroesophageal reflux disease)    History of blood transfusion    Hyperlipidemia    cannot tolerate statins   Hypertension    patient states she has never had high blood pressure.    Nuclear stress test    Myoview 05/2019: EF 83, no ischemia or infarction; Low Risk   Peripheral neuropathy    Pneumonia    PONV (postoperative nausea and vomiting)    RLS (restless legs syndrome)    Sleep apnea    uses CPAP   Ulcerative colitis ([place])    dr Collene Mares    Past Surgical History:  Procedure Laterality Date   ABDOMINAL HYSTERECTOMY     APPENDECTOMY  BREAST BIOPSY     BREAST SURGERY     left biopsy   CARDIAC CATHETERIZATION     CESAREAN SECTION     CHOLECYSTECTOMY     HERNIA REPAIR     INSERTION OF MESH N/A 11/15/2015   Procedure: INSERTION OF MESH;  Surgeon: [name], MD;  Location: [facility] ORS;  Service: General;  Laterality: N/A;   LAPAROSCOPIC LYSIS OF ADHESIONS N/A 11/15/2015   Procedure: LAPAROSCOPIC LYSIS OF ADHESIONS;   Surgeon: [name], MD;  Location: [facility];  Service: General;  Laterality: N/A;   RIGHT HEART CATHETERIZATION N/A 02/27/2013   Procedure: RIGHT HEART CATH;  Surgeon: Blane Ohara, MD;  Location: [facility] CATH LAB;  Service: Cardiovascular;  Laterality: N/A;   RIGHT HEART CATHETERIZATION N/A 12/14/2013   Procedure: RIGHT HEART CATH;  Surgeon: Larey Dresser, MD;  Location: [facility] CATH LAB;  Service: Cardiovascular;  Laterality: N/A;   SIGMOIDECTOMY  2010   diverticular disease   VENTRAL HERNIA REPAIR N/A 11/15/2015   Procedure: LAPAROSCOPIC VENTRAL WALL HERNIA REPAIR;  Surgeon: [name], MD;  Location: [facility] ORS;  Service: General;  Laterality: N/A;     reports that she has never smoked. She has never used smokeless tobacco. She reports that she does not drink alcohol and does not use drugs.  Allergies  Allergen Reactions   Almond Oil Anaphylaxis, Shortness Of Breath and Swelling   Morphine And Related Shortness Of Breath and Other (See Comments)    Pt. States while in the hospital it affected her breathing, O2 dropped to the 70's   Atorvastatin Other (See Comments)    Leg weakness   Ceclor [Cefaclor] Nausea And Vomiting   Ciprofloxacin Other (See Comments)    Makes joints and muscles ache   Levaquin [Levofloxacin] Other (See Comments)    Body aches   Losartan Other (See Comments)    Weakness    Statins Other (See Comments)    Leg and body weakness   Sulfa Antibiotics Nausea And Vomiting    Family History  Problem Relation Age of Onset   Heart disease Mother    Hypertension Mother    Dementia Mother    Heart disease Father    COPD Father    Hypertension Father    Heart disease Brother    Other Brother        knee replacement; hip replacement   Other Brother        cant brathe when laying down   Breast cancer Neg Hx    Colon cancer Neg Hx      Prior to Admission medications   Medication Sig Start Date End Date Taking? Authorizing Provider  acetaminophen (TYLENOL) 500  MG tablet Take 500 mg by mouth every 6 (six) hours as needed for moderate pain.   Yes [provider]  albuterol (VENTOLIN HFA) 108 (90 Base) MCG/ACT inhaler Inhale 2 puffs into the lungs every 6 (six) hours as needed. Patient taking differently: Inhale 2 puffs into the lungs every 6 (six) hours as needed for wheezing or shortness of breath. 02/16/20  Yes [name], MD  allopurinol (ZYLOPRIM) 100 MG tablet Take 0.5 tablets (50 mg total) by mouth daily. 08/18/20  Yes [name], MD  budesonide (PULMICORT) 0.5 MG/2ML nebulizer solution Take 2 mLs (0.5 mg total) by nebulization 2 (two) times daily. 05/23/20  Yes [name], MD  buPROPion (WELLBUTRIN XL) 300 MG 24 hr tablet TAKE 1 TABLET BY MOUTH DAILY Patient taking differently: Take 300  mg by mouth daily. 09/28/19  Yes [name], MD  Cholecalciferol (VITAMIN D-3) 5000 UNITS TABS Take 5,000 Units by mouth every other day.    Yes [provider]  colchicine 0.6 MG tablet Take 1 tablet (0.6 mg total) by mouth daily as needed (for gout). 08/15/20  Yes [name], MD  dicyclomine (BENTYL) 20 MG tablet TAKE 1 TABLET UP TO FOUR TIMES A DAY FOR GI CRAMPING, PAIN, AND NAUSEA/VOMITING AS NEEDED Patient taking differently: Take 20 mg by mouth daily as needed for spasms. 11/13/19  Yes [name], MD  Dulaglutide (TRULICITY) 3 XQ/1.1HE SOPN Inject 3 mg as directed once a week. 09/03/20  Yes [name], MD  DULoxetine (CYMBALTA) 60 MG capsule TAKE 1 CAPSULE BY MOUTH ONCE DAILY. Patient taking differently: Take 60 mg by mouth daily. 03/20/20  Yes [name], MD  furosemide (LASIX) 20 MG tablet Take 3 tablets (60 mg total) by mouth 2 (two) times daily. 05/13/20  Yes Weaver, Scott T, PA-C  gabapentin (NEURONTIN) 100 MG capsule TAKE 3 CAPSULES BY MOUTH AT BEDTIME. Patient taking differently: Take 300 mg by mouth daily. d 03/20/20  Yes [name], MD  insulin degludec (TRESIBA FLEXTOUCH) 200 UNIT/ML FlexTouch Pen  INJECT 88 UNITS UNDER SKIN ONCE DAILY Patient taking differently: Inject 88 Units into the skin daily. 09/13/20  Yes [name], MD  ipratropium-albuterol (DUONEB) 0.5-2.5 (3) MG/3ML SOLN Take 3 mLs by nebulization every 6 (six) hours as needed (wheezing). 05/25/19  Yes [provider]  ketoconazole (NIZORAL) 2 % cream Apply 1 application topically 2 (two) times daily. 09/03/20  Yes [name], MD  meloxicam (MOBIC) 15 MG tablet TAKE 1 TABLET (15 MG TOTAL) BY MOUTH DAILY. Patient taking differently: Take 15 mg by mouth daily as needed for pain. 08/26/20  Yes Edrick Kins, DPM  mesalamine (LIALDA) 1.2 g EC tablet Take 1.2 g by mouth 2 (two) times daily.   Yes [provider]  metolazone (ZAROXOLYN) 5 MG tablet TAKE 1 TABLET BY MOUTH AS NEEDED. Patient taking differently: Take 5 mg by mouth daily as needed (fluid). 08/19/20  Yes [name], MD  ondansetron (ZOFRAN) 4 MG tablet Take 1 tablet (4 mg total) by mouth every 6 (six) hours. Patient taking differently: Take 4 mg by mouth every 8 (eight) hours as needed for nausea or vomiting. 05/23/20  Yes Faustino Congress, NP  pantoprazole (PROTONIX) 40 MG tablet TAKE 1 TABLET BY MOUTH AT BEDTIME Patient taking differently: Take 40 mg by mouth daily. 08/21/20  Yes [name], MD  polyethylene glycol [facility] Floria Raveling) packet Take 17 g by mouth daily as needed for mild constipation.   Yes [provider]  potassium chloride (MICRO-K) 10 MEQ CR capsule TAKE 5 CAPSULES (50 MEQ TOTAL) BY MOUTH 2 (TWO) TIMES DAILY. 07/03/20  Yes Weaver, Scott T, PA-C  predniSONE (DELTASONE) 20 MG tablet Take 2 tablets (40 mg total) by mouth daily with breakfast. 09/18/20  Yes Brunetta Jeans, PA-C  propranolol (INDERAL) 10 MG tablet TAKE 1 TABLET BY MOUTH 2 TIMES DAILY Patient taking differently: Take 10 mg by mouth at bedtime. 03/20/20  Yes [name], MD  revefenacin [facility]) 175 MCG/3ML nebulizer solution Take 3 mLs (175  mcg total) by nebulization daily. 05/20/20  Yes [name], MD  rOPINIRole (REQUIP) 4 MG tablet TAKE 1 TABLET BY MOUTH TWICE DAILY Patient taking differently: Take 4 mg by mouth 2 (two) times daily.  TAKE 1 TABLET BY MOUTH TWICE DAILY 03/20/20  Yes [name], MD  spironolactone (ALDACTONE) 25 MG tablet TAKE 1 TABLET BY MOUTH DAILY. Patient taking differently: Take 25 mg by mouth daily. 07/31/20  Yes [name], MD  traMADol (ULTRAM) 50 MG tablet Take 1 tablet (50 mg total) by mouth 3 (three) times daily as needed. Patient taking differently: Take 50 mg by mouth 3 (three) times daily as needed for moderate pain. 06/18/20  Yes [name], MD  BAYER CONTOUR NEXT TEST test strip 1 each by Other route daily. As directed 07/30/15   [provider]  GLOBAL EASE INJECT PEN NEEDLES 32G X 4 MM MISC USE AS DIRECTED WITH VICTOZA AND TRESIBA. 08/12/20   [name], MD  MICROLET LANCETS MISC 1 each by Other route. As directed 07/30/15   [provider]  UNABLE TO FIND CPAP- At bedtime    [provider]    Physical Exam: Vitals:   09/19/20 1830 09/19/20 1900 09/19/20 2000 09/19/20 2214  BP: (!) 147/72 (!) 144/85 (!) 149/68 (!) 106/91  Pulse: 90 94 90 96  Resp: 20 17 14 20   Temp:    98.3 F (36.8 C)  TempSrc:    Oral  SpO2: 99% 92% 97% 98%  Weight:    123.4 kg    Constitutional: Awake alert and oriented x3, no associated distress.   Skin: no rashes, no lesions, good skin turgor noted. Eyes: Pupils are equally reactive to light.  No evidence of scleral icterus or conjunctival pallor.  ENMT: Moist mucous membranes noted.  Posterior pharynx clear of any exudate or lesions.   Neck: normal, supple, no masses, no thyromegaly.  No evidence of jugular venous distension.   Respiratory: Notable coarse breath sounds bilaterally, worst in the bases with expiratory wheezing .   Increased respiratory effort without accessory muscle use.  Cardiovascular: Regular rate  and rhythm, no murmurs / rubs / gallops. No extremity edema. 2+ pedal pulses. No carotid bruits.  Chest:   Nontender without crepitus or deformity.   Back:   Nontender without crepitus or deformity. Abdomen: Abdomen is protuberant but soft and nontender.  No evidence of intra-abdominal masses.  Positive bowel sounds noted in all quadrants.   Musculoskeletal: No joint deformity upper and lower extremities. Good ROM, no contractures. Normal muscle tone.  Neurologic: CN 2-12 grossly intact. Sensation intact.  Patient moving all 4 extremities spontaneously.  Patient is following all commands.  Patient is responsive to verbal stimuli.   Psychiatric: Patient exhibits normal mood with appropriate affect.  Patient seems to possess insight as to their current situation.     Labs on Admission: I have personally reviewed following labs and imaging studies -   CBC: Recent Labs  Lab 09/19/20 1529  WBC 17.4*  NEUTROABS 11.4*  HGB 12.4  HCT 37.9  MCV 93.1  PLT 163   Basic Metabolic Panel: Recent Labs  Lab 09/19/20 1529  NA 136  K 3.4*  CL 95*  CO2 30  GLUCOSE 260*  BUN 26*  CREATININE 1.15*  CALCIUM 9.0   GFR: Estimated Creatinine Clearance: 64.4 mL/min (A) (by C-G formula based on SCr of 1.15 mg/dL (H)). Liver Function Tests: Recent Labs  Lab 09/19/20 1529  AST 18  ALT 17  ALKPHOS 84  BILITOT 0.5  PROT 7.2  ALBUMIN 3.6   No results for input(s): LIPASE, AMYLASE in the last 168 hours. No results for input(s): AMMONIA in the last 168 hours.  Coagulation Profile: No results for input(s): INR, PROTIME in the last 168 hours. Cardiac Enzymes: No results for input(s): CKTOTAL, CKMB, CKMBINDEX, TROPONINI in the last 168 hours. BNP (last 3 results) Recent Labs    01/15/20 1447  PROBNP 51.0   HbA1C: No results for input(s): HGBA1C in the last 72 hours. CBG: No results for input(s): GLUCAP in the last 168 hours. Lipid Profile: No results for input(s): CHOL, HDL, LDLCALC, TRIG,  CHOLHDL, LDLDIRECT in the last 72 hours. Thyroid Function Tests: No results for input(s): TSH, T4TOTAL, FREET4, T3FREE, THYROIDAB in the last 72 hours. Anemia Panel: No results for input(s): VITAMINB12, FOLATE, FERRITIN, TIBC, IRON, RETICCTPCT in the last 72 hours. Urine analysis:    Component Value Date/Time   COLORURINE STRAW (A) 03/12/2019 1906   APPEARANCEUR CLEAR 03/12/2019 1906   LABSPEC 1.009 03/12/2019 1906   PHURINE 6.0 03/12/2019 1906   GLUCOSEU 150 (A) 03/12/2019 1906   HGBUR NEGATIVE 03/12/2019 1906   BILIRUBINUR negative 12/25/2019 1515   KETONESUR NEGATIVE 03/12/2019 1906   PROTEINUR Positive (A) 12/25/2019 1515   PROTEINUR NEGATIVE 03/12/2019 1906   UROBILINOGEN negative (A) 12/25/2019 1515   UROBILINOGEN 0.2 09/19/2008 1400   NITRITE negative 12/25/2019 1515   NITRITE NEGATIVE 03/12/2019 1906   LEUKOCYTESUR Large (3+) (A) 12/25/2019 1515   LEUKOCYTESUR NEGATIVE 03/12/2019 1906    Radiological Exams on Admission - Personally Reviewed: CT Angio Chest PE W and/or Wo Contrast  Result Date: 09/19/2020 CLINICAL DATA:  Shortness of breath and cough with positive D-dimer, initial encounter EXAM: CT ANGIOGRAPHY CHEST WITH CONTRAST TECHNIQUE: Multidetector CT imaging of the chest was performed using the standard protocol during bolus administration of intravenous contrast. Multiplanar CT image reconstructions and MIPs were obtained to evaluate the vascular anatomy. CONTRAST:  155m OMNIPAQUE IOHEXOL 350 MG/ML SOLN COMPARISON:  Chest x-ray from earlier in the same day. FINDINGS: Cardiovascular: Thoracic aorta and its branches demonstrate atherosclerotic calcification without aneurysmal dilatation. No evidence of dissection is seen. No cardiac enlargement is noted. The pulmonary artery is well visualized bilaterally within normal branching pattern. No definitive intraluminal filling defect to suggest pulmonary embolism is seen. Mediastinum/Nodes: Thoracic inlet is within normal  limits. No sizable hilar or mediastinal adenopathy is noted. The esophagus as visualized is within normal limits. Lungs/Pleura: Lungs are well aerated bilaterally. Patchy airspace opacity is noted in the lingula and left lower lobe as well as in the right upper lobe along the minor fissure anteriorly in the right upper lobe. Right upper lobe changes suggests the possibility of pulmonary infarct although no discrete sizable embolus is noted. Mild emphysematous changes are noted. No sizable effusion is seen. No sizable parenchymal nodule is noted. Upper Abdomen: Visualized upper abdomen shows fatty infiltration of the liver. No other focal abnormality is noted. Musculoskeletal: Degenerative changes of the thoracic spine are seen. No acute bony abnormality is noted. Review of the MIP images confirms the above findings. IMPRESSION: No definitive pulmonary emboli are seen. Patchy airspace opacity is noted bilaterally likely representing multifocal infiltrate. The right upper lobe changes mimic a pulmonary infarct although no definitive embolus is seen. Fatty liver. Aortic Atherosclerosis (ICD10-I70.0) and Emphysema (ICD10-J43.9). Electronically Signed   By: MInez CatalinaM.D.   On: 09/19/2020 19:28   DG Chest Port 1 View  Result Date: 09/19/2020 CLINICAL DATA:  Shortness of breath.  Hypoxia. EXAM: PORTABLE CHEST 1 VIEW COMPARISON:  01/08/2020 FINDINGS: Focal airspace consolidation noted peripheral right mid lung along the minor fissure laterally. There is also new left base  collapse/consolidative opacity. No evidence for pleural effusion. The cardio pericardial silhouette is enlarged. The visualized bony structures of the thorax show no acute abnormality. IMPRESSION: New bilateral airspace disease, left greater than right suggesting multifocal pneumonia. Imaging follow-up recommended to ensure resolution. Electronically Signed   By: Misty Stanley M.D.   On: 09/19/2020 17:07    EKG: Personally reviewed.  Rhythm is  normal sinus rhythm with heart rate of 89 bpm.  Evidence of right bundle branch block.  No dynamic ST segment changes appreciated.  Assessment/Plan Principal Problem:   Pneumonia of both lower lobes due to infectious organism  Patient presenting with several days of increasing shortness of breath cough and wheezing Patient exhibiting substantial leukocytosis on arrival (although recent prescription for prednisone may be contributing)  Chest imaging revealing patchy bilateral infiltrates concerning for early multifocal pneumonia COVID-19 testing negative Treated patient with intravenous ceftriaxone and azithromycin Supplemental oxygen for associated acute hypoxic respiratory failure Treating concurrent asthma and COPD exacerbation with bronchodilator therapy and systemic steroids Blood cultures ordered  Active Problems:   Acute exacerbation of COPD with asthma ([place])  Patient exhibiting signs concerning for concurrent asthma and COPD exacerbation with coarse breath sounds on examination with expiratory wheezing and prolonged symmetric face Treating patient with systemic steroids and aggressive bronchodilator therapy Additionally treating concurrent pneumonia with intravenous antibiotics Additionally provided patient with submental oxygen for associated acute hypoxic respiratory failure  Acute respiratory failure with hypoxia  Patient exhibiting bouts of hypoxia the emergency department requiring initiation of supplemental oxygen. Hypoxia likely secondary to ongoing COPD/asthma exacerbation and bilateral pneumonia Patient will likely be able to be weaned off of submental oxygen as she clinically improves    Essential hypertension  Continue home regimen of propranolol    GERD without esophagitis  Continue home regimen of PPI    OSA on CPAP  Continue CPAP nightly    Chronic diastolic heart failure (HCC)  No clinical evidence of cardiogenic volume overload at this time.    Type  2 diabetes mellitus with hyperglycemia, without long-term current use of insulin (HCC)  Patient presenting with substantial hyperglycemia Resuming home regimen of long-acting insulin Accu-Cheks before every meal and nightly with sliding scale insulin Hemoglobin A1c ordered and pending    Gout  Continue home regimen of allopurinol and colchicine    Mixed diabetic hyperlipidemia associated with type 2 diabetes mellitus ([place])  Continue home regimen of statin therapy   Code Status:  Full code Family Communication: Deferred   Status is: Observation  The patient remains OBS appropriate and will d/c before 2 midnights.  Dispo: The patient is from: Home              Anticipated d/c is to: Home              Patient currently is not medically stable to d/c.   Difficult to place patient No        Vernelle Emerald MD [facility] Pager [phone]  If 7PM-7AM, please contact night-coverage [email or web address] Use universal [place] password for that web site. If you do not have the password, please call the hospital operator.  09/20/2020, 12:44 AM

## 2020-09-20 NOTE — Progress Notes (Signed)
PROGRESS NOTE    Kathleen Hatfield  FVC:[id number] DOB: 01-Jul-1955 DOA: 09/19/2020 PCP: [name], MD   Brief Narrative:  65 year old female with past medical history of COPD, asthma, diastolic congestive heart failure (Echo 05/2019 EF 70% with G1DD), OSA on CPAP, hypertension, obesity, insulin-dependent diabetes mellitus type 2, hyperlipidemia, gout, gastroesophageal reflux disease who presents to [facility] emergency department with complaints of shortness of breath and cough -worsening over the previous 5 days without clear provoking incident.  She also complains of right-sided pleuritic chest pain with deep inspiration and cough.  Was seen in the outpatient setting on the 13th given oral prednisone but despite this and increased bronchodilators at home she continued to worsen with increasing dyspnea at rest and exertion.  In the ED patient found to be hypoxic requiring supplemental oxygen to maintain sats even at rest, CTA at intake was unremarkable for embolism but did show multifocal pneumonia.  Patient admitted for pneumonia with likely concurrent COPD/asthma exacerbation.  Assessment & Plan:   Principal Problem:   Pneumonia of both lower lobes due to infectious organism Active Problems:   Essential hypertension   GERD without esophagitis   OSA on CPAP   Chronic diastolic heart failure (HCC)   Type 2 diabetes mellitus with hyperglycemia, without long-term current use of insulin (HCC)   Gout   Acute exacerbation of COPD with asthma ([place])   Mixed diabetic hyperlipidemia associated with type 2 diabetes mellitus (HCC)   Acute hypoxia, multifactorial Sepsis secondary to community-acquired double pneumonia, POA COPD/Asthma exacerbation -Continue broad-spectrum antibiotics azithromycin and ceftriaxone to cover commune acquired pneumonia -Continue both inhaled and IV steroids -Continue nebs -Continue to wean oxygen as tolerated, patient is not on oxygen at baseline at home  although indicates she was "borderline" for needing oxygen with CPAP but does not appear to be on any currently.  Essential hypertension Continue home propranolol, currently well controlled   GERD without esophagitis Continue home regimen of PPI   OSA on CPAP Continue CPAP nightly   Chronic diastolic heart failure ([place]), not in acute exacerbation Patient appears euvolemic, no JVD or dependent edema at this time Follow closely, continue home diuretics   Type 2 diabetes mellitus uncontrolled with hyperglycemia, with long-term current use of insulin (HCC) Lab Results  Component Value Date   HGBA1C 8.5 (H) 09/20/2020  Continue sliding scale insulin, hypoglycemic protocol Continue home insulin Lantus 88 units daily -we will attempt to titrate patient's home insulin given profoundly uncontrolled diabetes although dietary and lifestyle compliance also appears to be a large portion of her uncontrolled nature  Gout Continue home regimen of allopurinol and colchicine   Mixed diabetic hyperlipidemia associated with type 2 diabetes mellitus ([place]) Continue home regimen of statin therapy    DVT prophylaxis: Lovenox Code Status: Full Family Communication: None present  Status is: Inpatient  Dispo: The patient is from: Home              Anticipated d/c is to: Home              Anticipated d/c date is: 48 to 72 hours pending clinical course              Patient currently not medically stable for discharge  Consultants:  None  Procedures:  None  Antimicrobials:  Ceftriaxone azithromycin x5 days  Subjective: No acute issues or events overnight, respiratory status improving but not yet back to baseline.  Denies nausea vomiting diarrhea constipation headache fevers chills chest pain  Objective: Vitals:   09/20/20 0220 09/20/20 0439 09/20/20 0558 09/20/20 0752  BP: 116/68  124/67   Pulse: 84  75   Resp: 20  18   Temp: 98.3 F (36.8 C)  98.1 F (36.7 C)   TempSrc: Oral  Oral    SpO2: 97%  98% 96%  Weight:      Height:  5' 5"  (1.651 m)      Intake/Output Summary (Last 24 hours) at 09/20/2020 0810 Last data filed at 09/20/2020 0200 Gross per 24 hour  Intake 610 ml  Output --  Net 610 ml   Filed Weights   09/19/20 2214  Weight: 123.4 kg    Examination:  General:  Pleasantly resting in bed, No acute distress. HEENT:  Normocephalic atraumatic.  Sclerae nonicteric, noninjected.  Extraocular movements intact bilaterally. Neck:  Without mass or deformity.  Trachea is midline. Lungs:  Clear to auscultate bilaterally with mild bibasilar rhonchi without overt rales or wheeze Heart:  Regular rate and rhythm.  Without murmurs, rubs, or gallops. Abdomen:  Soft, nontender, nondistended.  Without guarding or rebound. Extremities: Without cyanosis, clubbing, edema, or obvious deformity. Vascular:  Dorsalis pedis and posterior tibial pulses palpable bilaterally. Skin:  Warm and dry, no erythema, no ulcerations.   Data Reviewed: I have personally reviewed following labs and imaging studies  CBC: Recent Labs  Lab 09/19/20 1529 09/20/20 0114  WBC 17.4* 17.0*  NEUTROABS 11.4* 14.6*  HGB 12.4 13.0  HCT 37.9 38.7  MCV 93.1 91.9  PLT 292 902   Basic Metabolic Panel: Recent Labs  Lab 09/19/20 1529 09/20/20 0114  NA 136 136  K 3.4* 4.7  CL 95* 96*  CO2 30 29  GLUCOSE 260* 345*  BUN 26* 22  CREATININE 1.15* 1.42*  CALCIUM 9.0 9.5  MG  --  2.2   GFR: Estimated Creatinine Clearance: 52.1 mL/min (A) (by C-G formula based on SCr of 1.42 mg/dL (H)). Liver Function Tests: Recent Labs  Lab 09/19/20 1529 09/20/20 0114  AST 18 21  ALT 17 23  ALKPHOS 84 83  BILITOT 0.5 0.4  PROT 7.2 7.2  ALBUMIN 3.6 3.3*   No results for input(s): LIPASE, AMYLASE in the last 168 hours. No results for input(s): AMMONIA in the last 168 hours. Coagulation Profile: No results for input(s): INR, PROTIME in the last 168 hours. Cardiac Enzymes: No results for input(s):  CKTOTAL, CKMB, CKMBINDEX, TROPONINI in the last 168 hours. BNP (last 3 results) Recent Labs    01/15/20 1447  PROBNP 51.0   HbA1C: Recent Labs    09/20/20 0114  HGBA1C 8.5*   CBG: Recent Labs  Lab 09/20/20 0122 09/20/20 0631  GLUCAP 364* 431*   Lipid Profile: No results for input(s): CHOL, HDL, LDLCALC, TRIG, CHOLHDL, LDLDIRECT in the last 72 hours. Thyroid Function Tests: No results for input(s): TSH, T4TOTAL, FREET4, T3FREE, THYROIDAB in the last 72 hours. Anemia Panel: No results for input(s): VITAMINB12, FOLATE, FERRITIN, TIBC, IRON, RETICCTPCT in the last 72 hours. Sepsis Labs: No results for input(s): PROCALCITON, LATICACIDVEN in the last 168 hours.  Recent Results (from the past 240 hour(s))  Resp Panel by RT-PCR (Flu A&B, Covid) Nasopharyngeal Swab     Status: None   Collection Time: 09/19/20  3:30 PM   Specimen: Nasopharyngeal Swab; Nasopharyngeal(NP) swabs in vial transport medium  Result Value Ref Range Status   SARS Coronavirus 2 by RT PCR NEGATIVE NEGATIVE Final    Comment: (NOTE) SARS-CoV-2 target nucleic acids are NOT DETECTED.  The SARS-CoV-2 RNA is generally detectable in upper respiratory specimens during the acute phase of infection. The lowest concentration of SARS-CoV-2 viral copies this assay can detect is 138 copies/mL. A negative result does not preclude SARS-Cov-2 infection and should not be used as the sole basis for treatment or other patient management decisions. A negative result may occur with  improper specimen collection/handling, submission of specimen other than nasopharyngeal swab, presence of viral mutation(s) within the areas targeted by this assay, and inadequate number of viral copies(<138 copies/mL). A negative result must be combined with clinical observations, patient history, and epidemiological information. The expected result is Negative.  Fact Sheet for Patients:  EntrepreneurPulse.com.au  Fact Sheet  for Healthcare Providers:  IncredibleEmployment.be  This test is no t yet approved or cleared by the [place] FDA and  has been authorized for detection and/or diagnosis of SARS-CoV-2 by FDA under an Emergency Use Authorization (EUA). This EUA will remain  in effect (meaning this test can be used) for the duration of the COVID-19 declaration under Section 564(b)(1) of the Act, 21 U.S.C.section 360bbb-3(b)(1), unless the authorization is terminated  or revoked sooner.       Influenza A by PCR NEGATIVE NEGATIVE Final   Influenza B by PCR NEGATIVE NEGATIVE Final    Comment: (NOTE) The Xpert Xpress SARS-CoV-2/FLU/RSV plus assay is intended as an aid in the diagnosis of influenza from Nasopharyngeal swab specimens and should not be used as a sole basis for treatment. Nasal washings and aspirates are unacceptable for Xpert Xpress SARS-CoV-2/FLU/RSV testing.  Fact Sheet for Patients: EntrepreneurPulse.com.au  Fact Sheet for Healthcare Providers: IncredibleEmployment.be  This test is not yet approved or cleared by the [place] FDA and has been authorized for detection and/or diagnosis of SARS-CoV-2 by FDA under an Emergency Use Authorization (EUA). This EUA will remain in effect (meaning this test can be used) for the duration of the COVID-19 declaration under Section 564(b)(1) of the Act, 21 U.S.C. section 360bbb-3(b)(1), unless the authorization is terminated or revoked.  Performed at [facility], [place] [address], [place], [place] [address]   MRSA Next Gen by PCR, Nasal     Status: Abnormal   Collection Time: 09/20/20  1:40 AM  Result Value Ref Range Status   MRSA by PCR Next Gen DETECTED (A) NOT DETECTED Final    Comment: RESULT CALLED TO, READ BACK BY AND VERIFIED WITH: Eston Esters [phone] 09/20/2020 T. TYSOR (NOTE) The GeneXpert MRSA Assay (FDA approved for NASAL specimens only), is one  component of a comprehensive MRSA colonization surveillance program. It is not intended to diagnose MRSA infection nor to guide or monitor treatment for MRSA infections. Test performance is not FDA approved in patients less than 58 years old. Performed at [facility], [place] [address], [place],  [address]    Radiology Studies: CT Angio Chest PE W and/or Wo Contrast  Result Date: 09/19/2020 CLINICAL DATA:  Shortness of breath and cough with positive D-dimer, initial encounter EXAM: CT ANGIOGRAPHY CHEST WITH CONTRAST TECHNIQUE: Multidetector CT imaging of the chest was performed using the standard protocol during bolus administration of intravenous contrast. Multiplanar CT image reconstructions and MIPs were obtained to evaluate the vascular anatomy. CONTRAST:  18m OMNIPAQUE IOHEXOL 350 MG/ML SOLN COMPARISON:  Chest x-ray from earlier in the same day. FINDINGS: Cardiovascular: Thoracic aorta and its branches demonstrate atherosclerotic calcification without aneurysmal dilatation. No evidence of dissection is seen. No cardiac enlargement is noted. The pulmonary artery is well visualized  bilaterally within normal branching pattern. No definitive intraluminal filling defect to suggest pulmonary embolism is seen. Mediastinum/Nodes: Thoracic inlet is within normal limits. No sizable hilar or mediastinal adenopathy is noted. The esophagus as visualized is within normal limits. Lungs/Pleura: Lungs are well aerated bilaterally. Patchy airspace opacity is noted in the lingula and left lower lobe as well as in the right upper lobe along the minor fissure anteriorly in the right upper lobe. Right upper lobe changes suggests the possibility of pulmonary infarct although no discrete sizable embolus is noted. Mild emphysematous changes are noted. No sizable effusion is seen. No sizable parenchymal nodule is noted. Upper Abdomen: Visualized upper abdomen shows fatty infiltration of the liver. No other focal  abnormality is noted. Musculoskeletal: Degenerative changes of the thoracic spine are seen. No acute bony abnormality is noted. Review of the MIP images confirms the above findings. IMPRESSION: No definitive pulmonary emboli are seen. Patchy airspace opacity is noted bilaterally likely representing multifocal infiltrate. The right upper lobe changes mimic a pulmonary infarct although no definitive embolus is seen. Fatty liver. Aortic Atherosclerosis (ICD10-I70.0) and Emphysema (ICD10-J43.9). Electronically Signed   By: Inez Catalina M.D.   On: 09/19/2020 19:28   DG Chest Port 1 View  Result Date: 09/19/2020 CLINICAL DATA:  Shortness of breath.  Hypoxia. EXAM: PORTABLE CHEST 1 VIEW COMPARISON:  01/08/2020 FINDINGS: Focal airspace consolidation noted peripheral right mid lung along the minor fissure laterally. There is also new left base collapse/consolidative opacity. No evidence for pleural effusion. The cardio pericardial silhouette is enlarged. The visualized bony structures of the thorax show no acute abnormality. IMPRESSION: New bilateral airspace disease, left greater than right suggesting multifocal pneumonia. Imaging follow-up recommended to ensure resolution. Electronically Signed   By: Misty [place] M.D.   On: 09/19/2020 17:07    Scheduled Meds:  allopurinol  50 mg Oral Daily   budesonide  0.5 mg Nebulization BID   buPROPion  300 mg Oral Daily   Chlorhexidine Gluconate Cloth  6 each Topical Q0600   DULoxetine  60 mg Oral Daily   enoxaparin (LOVENOX) injection  40 mg Subcutaneous Q24H   furosemide  60 mg Oral BID   gabapentin  300 mg Oral QHS   insulin aspart  0-15 Units Subcutaneous TID AC & HS   insulin glargine  88 Units Subcutaneous Daily   ipratropium-albuterol  3 mL Nebulization Q6H   methylPREDNISolone (SOLU-MEDROL) injection  60 mg Intravenous Q12H   mupirocin ointment  1 application Nasal BID   pantoprazole  40 mg Oral QHS   propranolol  10 mg Oral QHS   rOPINIRole  4 mg Oral  BID   spironolactone  25 mg Oral Daily   Continuous Infusions:  azithromycin     cefTRIAXone (ROCEPHIN)  IV       LOS: 0 days   Time spent: 59mn  Tillman Kazmierski C Messina Kosinski, DO [facility]  If 7PM-7AM, please contact night-coverage [email or web address]  09/20/2020, 8:10 AM

## 2020-09-20 NOTE — Plan of Care (Signed)

## 2020-09-20 NOTE — Care Management Obs Status (Signed)
[facility] NOTIFICATION   Patient Details  Name: Kathleen Hatfield MRN: [id number] Date of Birth: [birth year]   Medicare Observation Status Notification Given:  Yes  Permission to sign due to remote  Verdell Carmine, RN 09/20/2020, 3:30 PM

## 2020-09-20 NOTE — TOC Initial Note (Addendum)
Transition of Care [facility]) - Initial/Assessment Note    Patient Details  Name: Kathleen Hatfield MRN: [id number] Date of Birth: 08-Oct-1955  Transition of Care [facility]) CM/SW Contact:    Verdell Carmine, RN Phone Number: 09/20/2020, 11:42 AM  Clinical Narrative:                 65 year old patient with a history of DM II insulin dependent, COPD and OSA on CPAP from [facility]. Increasing SHOB, saturations on RA 80's, on supplemental o2 for bilateral pneumonia. She has been vaccinated for COVID, COVID negative. CM will follow for needs including potential oxygen at home, and home health if recommended.  Patient has had [facility] before for home health, has a cane and walker at home due to knee and ankle trouble. Patient also has nebulizer for treatments.   Expected Discharge Plan: Home/Self Care Barriers to Discharge: Continued Medical Work up   Patient Goals and CMS Choice        Expected Discharge Plan and Services Expected Discharge Plan: Home/Self Care       Living arrangements for the past 2 months: Single Family Home                                      Prior Living Arrangements/Services Living arrangements for the past 2 months: Single Family Home Lives with:: Significant Other Patient language and need for interpreter reviewed:: Yes        Need for Family Participation in Patient Care: Yes (Comment) Care giver support system in place?: Yes (comment)   Criminal Activity/Legal Involvement Pertinent to Current Situation/Hospitalization: No - Comment as needed  Activities of Daily Living Home Assistive Devices/Equipment: Eyeglasses, CPAP, Nebulizer, Walker (specify type) ADL Screening (condition at time of admission) Patient's cognitive ability adequate to safely complete daily activities?: Yes Is the patient deaf or have difficulty hearing?: No Does the patient have difficulty seeing, even when wearing glasses/contacts?: No Does the patient have difficulty  concentrating, remembering, or making decisions?: No Patient able to express need for assistance with ADLs?: Yes Does the patient have difficulty dressing or bathing?: Yes Independently performs ADLs?: No Communication: Independent Dressing (OT): Needs assistance Is this a change from baseline?: Change from baseline, expected to last >3 days Grooming: Independent Feeding: Independent Bathing: Needs assistance Is this a change from baseline?: Change from baseline, expected to last >3 days Toileting: Needs assistance Is this a change from baseline?: Change from baseline, expected to last >3days In/Out Bed: Needs assistance Is this a change from baseline?: Change from baseline, expected to last >3 days Walks in Home: Needs assistance Is this a change from baseline?: Pre-admission baseline Does the patient have difficulty walking or climbing stairs?: Yes Weakness of Legs: Both Weakness of Arms/Hands: Both  Permission Sought/Granted                  Emotional Assessment       Orientation: : Oriented to Self, Oriented to Place, Oriented to  Time, Oriented to Situation Alcohol / Substance Use: Not Applicable Psych Involvement: No (comment)  Admission diagnosis:  COPD exacerbation ([place]) [J44.1] Patient Active Problem List   Diagnosis Date Noted   Acute exacerbation of COPD with asthma ([place]) 09/19/2020   Mixed diabetic hyperlipidemia associated with type 2 diabetes mellitus ([place]) 09/19/2020   Pain of foot 06/20/2020   Medicare annual wellness visit, initial 06/20/2020  Constipation 06/11/2020   Diverticulosis of colon 06/11/2020   Irritable bowel syndrome 43/60/1658   Periumbilical pain 00/63/4949   RLS (restless legs syndrome)    Acute asthma exacerbation 01/09/2020   Asthma exacerbation 01/08/2020   Muscle weakness 01/08/2020   Grade I diastolic dysfunction 44/73/9584   Statin myopathy 10/10/2019   Advance care planning 06/14/2019   RLQ abdominal pain 06/14/2019    COPD (chronic obstructive pulmonary disease) ([place]) 06/14/2019   Leukocytosis 06/14/2019   Hypercalcemia 06/14/2019   Gout 06/14/2019   Type 2 diabetes mellitus with hyperglycemia, without long-term current use of insulin ([place])    COPD with acute exacerbation ([place]) 05/03/2017   Acute renal failure superimposed on stage 2 chronic kidney disease ([place]) 04/02/2016   Diabetes mellitus without complication (HCC)    CKD (chronic kidney disease)    Hypersomnia with sleep apnea 09/16/2015   Fatigue 06/07/2015   Morbid obesity ([place]) 06/07/2015   Chronic diastolic heart failure (HCC)    Pneumonia of both lower lobes due to infectious organism 02/09/2013   OSA on CPAP 08/23/2012   Essential hypertension    Depression    GERD without esophagitis    Hyperlipidemia    Ulcerative colitis ([place])    Patellar tendinitis 05/25/2011   Knee pain 05/25/2011   Myofascial pain 05/25/2011   Cervicalgia 05/25/2011   PCP:  [name], MD Pharmacy:   [facility] ORDER) [place], [place] [address] [place] [address] Phone: [phone] Fax: [phone]  [place], [place] - [place] [address] Phone: [phone] Fax: [phone]  [id number] [place]: [phone]: [phone]    Social Determinants of Health (SDOH) Interventions    Readmission Risk Interventions No flowsheet data found.

## 2020-09-20 NOTE — Progress Notes (Signed)
Inpatient Diabetes Program Recommendations  AACE/ADA: New Consensus Statement on Inpatient Glycemic Control (2015)  Target Ranges:  Prepandial:   less than 140 mg/dL      Peak postprandial:   less than 180 mg/dL (1-2 hours)      Critically ill patients:  140 - 180 mg/dL   Lab Results  Component Value Date   GLUCAP 431 (H) 09/20/2020   HGBA1C 8.5 (H) 09/20/2020    Review of Glycemic Control Results for Kathleen Hatfield, Kathleen Hatfield "[name]" (MRN [id number]) as of 09/20/2020 10:05  Ref. Range 01/09/2020 16:34 01/09/2020 21:30 01/10/2020 08:16 09/20/2020 01:22 09/20/2020 06:31  Glucose-Capillary Latest Ref Range: 70 - 99 mg/dL 385 (H) 408 (H) 302 (H) 364 (H) 431 (H)   Diabetes history: Type 2 DM Outpatient Diabetes medications: Tresiba 88 units QD, Trulicity 3 mg Qwk Current orders for Inpatient glycemic control: Lantus 88 units QHS, Novolog 0-15 units TID & HS Solumedrol 60 mg BID  Inpatient Diabetes Program Recommendations:    In the setting of steroids, Consider adding Novolog 6 units TID (Assuming patient is consuming >50% of meals).   Thanks, Bronson Curb, MSN, RNC-OB Diabetes Coordinator [phone] (8a-5p)

## 2020-09-21 DIAGNOSIS — I130 Hypertensive heart and chronic kidney disease with heart failure and stage 1 through stage 4 chronic kidney disease, or unspecified chronic kidney disease: Secondary | ICD-10-CM | POA: Diagnosis not present

## 2020-09-21 DIAGNOSIS — G2581 Restless legs syndrome: Secondary | ICD-10-CM | POA: Diagnosis not present

## 2020-09-21 DIAGNOSIS — M1990 Unspecified osteoarthritis, unspecified site: Secondary | ICD-10-CM | POA: Diagnosis not present

## 2020-09-21 DIAGNOSIS — Z20822 Contact with and (suspected) exposure to covid-19: Secondary | ICD-10-CM | POA: Diagnosis not present

## 2020-09-21 DIAGNOSIS — M109 Gout, unspecified: Secondary | ICD-10-CM | POA: Diagnosis not present

## 2020-09-21 DIAGNOSIS — J189 Pneumonia, unspecified organism: Secondary | ICD-10-CM | POA: Diagnosis not present

## 2020-09-21 DIAGNOSIS — G629 Polyneuropathy, unspecified: Secondary | ICD-10-CM | POA: Diagnosis not present

## 2020-09-21 DIAGNOSIS — E1122 Type 2 diabetes mellitus with diabetic chronic kidney disease: Secondary | ICD-10-CM | POA: Diagnosis not present

## 2020-09-21 DIAGNOSIS — J45901 Unspecified asthma with (acute) exacerbation: Secondary | ICD-10-CM | POA: Diagnosis not present

## 2020-09-21 DIAGNOSIS — E785 Hyperlipidemia, unspecified: Secondary | ICD-10-CM | POA: Diagnosis not present

## 2020-09-21 DIAGNOSIS — G4733 Obstructive sleep apnea (adult) (pediatric): Secondary | ICD-10-CM | POA: Diagnosis not present

## 2020-09-21 DIAGNOSIS — E669 Obesity, unspecified: Secondary | ICD-10-CM | POA: Diagnosis not present

## 2020-09-21 DIAGNOSIS — Z6841 Body Mass Index (BMI) 40.0 and over, adult: Secondary | ICD-10-CM | POA: Diagnosis not present

## 2020-09-21 DIAGNOSIS — E1165 Type 2 diabetes mellitus with hyperglycemia: Secondary | ICD-10-CM | POA: Diagnosis not present

## 2020-09-21 DIAGNOSIS — J441 Chronic obstructive pulmonary disease with (acute) exacerbation: Secondary | ICD-10-CM | POA: Diagnosis not present

## 2020-09-21 DIAGNOSIS — E1169 Type 2 diabetes mellitus with other specified complication: Secondary | ICD-10-CM | POA: Diagnosis not present

## 2020-09-21 DIAGNOSIS — N189 Chronic kidney disease, unspecified: Secondary | ICD-10-CM | POA: Diagnosis not present

## 2020-09-21 DIAGNOSIS — Z79899 Other long term (current) drug therapy: Secondary | ICD-10-CM | POA: Diagnosis not present

## 2020-09-21 DIAGNOSIS — A419 Sepsis, unspecified organism: Secondary | ICD-10-CM | POA: Diagnosis not present

## 2020-09-21 DIAGNOSIS — K219 Gastro-esophageal reflux disease without esophagitis: Secondary | ICD-10-CM | POA: Diagnosis not present

## 2020-09-21 DIAGNOSIS — I5032 Chronic diastolic (congestive) heart failure: Secondary | ICD-10-CM | POA: Diagnosis not present

## 2020-09-21 DIAGNOSIS — F32A Depression, unspecified: Secondary | ICD-10-CM | POA: Diagnosis not present

## 2020-09-21 DIAGNOSIS — J440 Chronic obstructive pulmonary disease with acute lower respiratory infection: Secondary | ICD-10-CM | POA: Diagnosis not present

## 2020-09-21 DIAGNOSIS — J9601 Acute respiratory failure with hypoxia: Secondary | ICD-10-CM | POA: Diagnosis not present

## 2020-09-21 LAB — BASIC METABOLIC PANEL
Anion gap: 11 (ref 5–15)
BUN: 30 mg/dL — ABNORMAL HIGH (ref 8–23)
CO2: 28 mmol/L (ref 22–32)
Calcium: 9.7 mg/dL (ref 8.9–10.3)
Chloride: 98 mmol/L (ref 98–111)
Creatinine, Ser: 1.23 mg/dL — ABNORMAL HIGH (ref 0.44–1.00)
GFR, Estimated: 49 mL/min — ABNORMAL LOW (ref >60)
Glucose, Bld: 214 mg/dL — ABNORMAL HIGH (ref 70–99)
Potassium: 4.4 mmol/L (ref 3.5–5.1)
Sodium: 137 mmol/L (ref 135–145)

## 2020-09-21 LAB — GLUCOSE, CAPILLARY
Glucose-Capillary: 271 mg/dL — ABNORMAL HIGH (ref 70–99)
Glucose-Capillary: 305 mg/dL — ABNORMAL HIGH (ref 70–99)
Glucose-Capillary: 374 mg/dL — ABNORMAL HIGH (ref 70–99)
Glucose-Capillary: 328 mg/dL — ABNORMAL HIGH (ref 70–99)

## 2020-09-21 LAB — CBC
HCT: 35.5 % — ABNORMAL LOW (ref 36.0–46.0)
Hemoglobin: 11.8 g/dL — ABNORMAL LOW (ref 12.0–15.0)
MCH: 31.1 pg (ref 26.0–34.0)
MCHC: 33.2 g/dL (ref 30.0–36.0)
MCV: 93.4 fL (ref 80.0–100.0)
Platelets: 277 10*3/uL (ref 150–400)
RBC: 3.8 MIL/uL — ABNORMAL LOW (ref 3.87–5.11)
RDW: 14.4 % (ref 11.5–15.5)
WBC: 14.2 10*3/uL — ABNORMAL HIGH (ref 4.0–10.5)
nRBC: 0 % (ref 0.0–0.2)

## 2020-09-21 MED ORDER — IPRATROPIUM-ALBUTEROL 0.5-2.5 (3) MG/3ML IN SOLN
3.0000 mL | Freq: Two times a day (BID) | RESPIRATORY_TRACT | Status: DC
  Administered 2020-09-21 – 2020-09-22 (×2): 3 mL via RESPIRATORY_TRACT
  Filled 2020-09-21 (×2): qty 3

## 2020-09-21 NOTE — Progress Notes (Signed)
Pt placed on cpap for the night. °

## 2020-09-21 NOTE — Plan of Care (Signed)

## 2020-09-21 NOTE — Progress Notes (Signed)
PROGRESS NOTE    Kathleen Hatfield  OJJ:[id number] DOB: 06/06/1955 DOA: 09/19/2020 PCP: [name], MD   Brief Narrative:  65 year old female with past medical history of COPD, asthma, diastolic congestive heart failure (Echo 05/2019 EF 70% with G1DD), OSA on CPAP, hypertension, obesity, insulin-dependent diabetes mellitus type 2, hyperlipidemia, gout, gastroesophageal reflux disease who presents to [facility] emergency department with complaints of shortness of breath and cough -worsening over the previous 5 days without clear provoking incident.  She also complains of right-sided pleuritic chest pain with deep inspiration and cough.  Was seen in the outpatient setting on the 13th given oral prednisone but despite this and increased bronchodilators at home she continued to worsen with increasing dyspnea at rest and exertion.  In the ED patient found to be hypoxic requiring supplemental oxygen to maintain sats even at rest, CTA at intake was unremarkable for embolism but did show multifocal pneumonia.  Patient admitted for pneumonia with likely concurrent COPD/asthma exacerbation.  Assessment & Plan:   Principal Problem:   Pneumonia of both lower lobes due to infectious organism Active Problems:   Essential hypertension   GERD without esophagitis   OSA on CPAP   Chronic diastolic heart failure (HCC)   Type 2 diabetes mellitus with hyperglycemia, without long-term current use of insulin (HCC)   Gout   Acute exacerbation of COPD with asthma ([place])   Mixed diabetic hyperlipidemia associated with type 2 diabetes mellitus (HCC)   Acute hypoxia, multifactorial Sepsis secondary to community-acquired double pneumonia, POA COPD/Asthma exacerbation -Continue broad-spectrum antibiotics azithromycin and ceftriaxone to cover commune acquired pneumonia -Continue both inhaled and IV steroids -Continue nebs -Continue to wean oxygen as tolerated, patient is not on oxygen at baseline at home   SpO2: 95 % O2 Flow Rate (L/min): 2 L/min FiO2 (%): 28 %  Essential hypertension Continue home propranolol, currently well controlled   GERD without esophagitis Continue home regimen of PPI   OSA on CPAP Continue CPAP nightly   Chronic diastolic heart failure ([place]), not in acute exacerbation Patient appears euvolemic, no JVD or dependent edema at this time Follow closely, continue home diuretics   Type 2 diabetes mellitus uncontrolled with hyperglycemia, with long-term current use of insulin (HCC) Lab Results  Component Value Date   HGBA1C 8.5 (H) 09/20/2020  Continue sliding scale insulin, hypoglycemic protocol Continue home insulin Lantus 88 units daily -we will attempt to titrate patient's home insulin given profoundly uncontrolled diabetes although dietary and lifestyle compliance also appears to be a large portion of her uncontrolled nature  Gout Continue home regimen of allopurinol and colchicine   Mixed diabetic hyperlipidemia associated with type 2 diabetes mellitus ([place]) Continue home regimen of statin therapy    DVT prophylaxis: Lovenox Code Status: Full Family Communication: None present  Status is: Inpatient  Dispo: The patient is from: Home              Anticipated d/c is to: Home              Anticipated d/c date is: 48 to 72 hours pending clinical course              Patient currently not medically stable for discharge  Consultants:  None  Procedures:  None  Antimicrobials:  Ceftriaxone azithromycin x5 days  Subjective: No acute issues or events overnight, respiratory status improving but not yet back to baseline.  Denies nausea vomiting diarrhea constipation headache fevers chills chest pain  Objective: Vitals:   09/20/20  2002 09/20/20 2003 09/20/20 2055 09/21/20 0536  BP:   (!) 111/57 138/62  Pulse: 79  80 73  Resp: 16  18 19   Temp:   98.3 F (36.8 C) 98.7 F (37.1 C)  TempSrc:   Oral   SpO2: 94% 93% 95% 95%  Weight:      Height:         Intake/Output Summary (Last 24 hours) at 09/21/2020 0758 Last data filed at 09/21/2020 0600 Gross per 24 hour  Intake 580 ml  Output 800 ml  Net -220 ml    Filed Weights   09/19/20 2214  Weight: 123.4 kg    Examination:  General:  Pleasantly resting in bed, No acute distress. HEENT:  Normocephalic atraumatic.  Sclerae nonicteric, noninjected.  Extraocular movements intact bilaterally. Neck:  Without mass or deformity.  Trachea is midline. Lungs:  Clear to auscultate bilaterally with mild bibasilar rhonchi without overt rales or wheeze Heart:  Regular rate and rhythm.  Without murmurs, rubs, or gallops. Abdomen:  Soft, nontender, nondistended.  Without guarding or rebound. Extremities: Without cyanosis, clubbing, edema, or obvious deformity. Vascular:  Dorsalis pedis and posterior tibial pulses palpable bilaterally. Skin:  Warm and dry, no erythema, no ulcerations.   Data Reviewed: I have personally reviewed following labs and imaging studies  CBC: Recent Labs  Lab 09/19/20 1529 09/20/20 0114 09/21/20 0304  WBC 17.4* 17.0* 14.2*  NEUTROABS 11.4* 14.6*  --   HGB 12.4 13.0 11.8*  HCT 37.9 38.7 35.5*  MCV 93.1 91.9 93.4  PLT 292 274 102    Basic Metabolic Panel: Recent Labs  Lab 09/19/20 1529 09/20/20 0114 09/21/20 0304  NA 136 136 137  K 3.4* 4.7 4.4  CL 95* 96* 98  CO2 30 29 28   GLUCOSE 260* 345* 214*  BUN 26* 22 30*  CREATININE 1.15* 1.42* 1.23*  CALCIUM 9.0 9.5 9.7  MG  --  2.2  --     GFR: Estimated Creatinine Clearance: 60.2 mL/min (A) (by C-G formula based on SCr of 1.23 mg/dL (H)). Liver Function Tests: Recent Labs  Lab 09/19/20 1529 09/20/20 0114  AST 18 21  ALT 17 23  ALKPHOS 84 83  BILITOT 0.5 0.4  PROT 7.2 7.2  ALBUMIN 3.6 3.3*    No results for input(s): LIPASE, AMYLASE in the last 168 hours. No results for input(s): AMMONIA in the last 168 hours. Coagulation Profile: No results for input(s): INR, PROTIME in the last 168  hours. Cardiac Enzymes: No results for input(s): CKTOTAL, CKMB, CKMBINDEX, TROPONINI in the last 168 hours. BNP (last 3 results) Recent Labs    01/15/20 1447  PROBNP 51.0    HbA1C: Recent Labs    09/20/20 0114  HGBA1C 8.5*    CBG: Recent Labs  Lab 09/20/20 0631 09/20/20 1119 09/20/20 1651 09/20/20 2051 09/21/20 0625  GLUCAP 431* 319* 275* 413* 271*    Lipid Profile: No results for input(s): CHOL, HDL, LDLCALC, TRIG, CHOLHDL, LDLDIRECT in the last 72 hours. Thyroid Function Tests: No results for input(s): TSH, T4TOTAL, FREET4, T3FREE, THYROIDAB in the last 72 hours. Anemia Panel: No results for input(s): VITAMINB12, FOLATE, FERRITIN, TIBC, IRON, RETICCTPCT in the last 72 hours. Sepsis Labs: No results for input(s): PROCALCITON, LATICACIDVEN in the last 168 hours.  Recent Results (from the past 240 hour(s))  Resp Panel by RT-PCR (Flu A&B, Covid) Nasopharyngeal Swab     Status: None   Collection Time: 09/19/20  3:30 PM   Specimen: Nasopharyngeal Swab; Nasopharyngeal(NP) swabs in  vial transport medium  Result Value Ref Range Status   SARS Coronavirus 2 by RT PCR NEGATIVE NEGATIVE Final    Comment: (NOTE) SARS-CoV-2 target nucleic acids are NOT DETECTED.  The SARS-CoV-2 RNA is generally detectable in upper respiratory specimens during the acute phase of infection. The lowest concentration of SARS-CoV-2 viral copies this assay can detect is 138 copies/mL. A negative result does not preclude SARS-Cov-2 infection and should not be used as the sole basis for treatment or other patient management decisions. A negative result may occur with  improper specimen collection/handling, submission of specimen other than nasopharyngeal swab, presence of viral mutation(s) within the areas targeted by this assay, and inadequate number of viral copies(<138 copies/mL). A negative result must be combined with clinical observations, patient history, and epidemiological information.  The expected result is Negative.  Fact Sheet for Patients:  EntrepreneurPulse.com.au  Fact Sheet for Healthcare Providers:  IncredibleEmployment.be  This test is no t yet approved or cleared by the [place] FDA and  has been authorized for detection and/or diagnosis of SARS-CoV-2 by FDA under an Emergency Use Authorization (EUA). This EUA will remain  in effect (meaning this test can be used) for the duration of the COVID-19 declaration under Section 564(b)(1) of the Act, 21 U.S.C.section 360bbb-3(b)(1), unless the authorization is terminated  or revoked sooner.       Influenza A by PCR NEGATIVE NEGATIVE Final   Influenza B by PCR NEGATIVE NEGATIVE Final    Comment: (NOTE) The Xpert Xpress SARS-CoV-2/FLU/RSV plus assay is intended as an aid in the diagnosis of influenza from Nasopharyngeal swab specimens and should not be used as a sole basis for treatment. Nasal washings and aspirates are unacceptable for Xpert Xpress SARS-CoV-2/FLU/RSV testing.  Fact Sheet for Patients: EntrepreneurPulse.com.au  Fact Sheet for Healthcare Providers: IncredibleEmployment.be  This test is not yet approved or cleared by the [place] FDA and has been authorized for detection and/or diagnosis of SARS-CoV-2 by FDA under an Emergency Use Authorization (EUA). This EUA will remain in effect (meaning this test can be used) for the duration of the COVID-19 declaration under Section 564(b)(1) of the Act, 21 U.S.C. section 360bbb-3(b)(1), unless the authorization is terminated or revoked.  Performed at [facility], [employer] [address], [place], [place] [address]   MRSA Next Gen by PCR, Nasal     Status: Abnormal   Collection Time: 09/20/20  1:40 AM  Result Value Ref Range Status   MRSA by PCR Next Gen DETECTED (A) NOT DETECTED Final    Comment: RESULT CALLED TO, READ BACK BY AND VERIFIED WITH: Eston Esters [phone] 09/20/2020 T. TYSOR (NOTE) The GeneXpert MRSA Assay (FDA approved for NASAL specimens only), is one component of a comprehensive MRSA colonization surveillance program. It is not intended to diagnose MRSA infection nor to guide or monitor treatment for MRSA infections. Test performance is not FDA approved in patients less than 61 years old. Performed at [facility], [place] [address], [place], [place] [address]    Radiology Studies: CT Angio Chest PE W and/or Wo Contrast  Result Date: 09/19/2020 CLINICAL DATA:  Shortness of breath and cough with positive D-dimer, initial encounter EXAM: CT ANGIOGRAPHY CHEST WITH CONTRAST TECHNIQUE: Multidetector CT imaging of the chest was performed using the standard protocol during bolus administration of intravenous contrast. Multiplanar CT image reconstructions and MIPs were obtained to evaluate the vascular anatomy. CONTRAST:  176m OMNIPAQUE IOHEXOL 350 MG/ML SOLN COMPARISON:  Chest x-ray from earlier in  the same day. FINDINGS: Cardiovascular: Thoracic aorta and its branches demonstrate atherosclerotic calcification without aneurysmal dilatation. No evidence of dissection is seen. No cardiac enlargement is noted. The pulmonary artery is well visualized bilaterally within normal branching pattern. No definitive intraluminal filling defect to suggest pulmonary embolism is seen. Mediastinum/Nodes: Thoracic inlet is within normal limits. No sizable hilar or mediastinal adenopathy is noted. The esophagus as visualized is within normal limits. Lungs/Pleura: Lungs are well aerated bilaterally. Patchy airspace opacity is noted in the lingula and left lower lobe as well as in the right upper lobe along the minor fissure anteriorly in the right upper lobe. Right upper lobe changes suggests the possibility of pulmonary infarct although no discrete sizable embolus is noted. Mild emphysematous changes are noted. No sizable effusion is seen. No sizable  parenchymal nodule is noted. Upper Abdomen: Visualized upper abdomen shows fatty infiltration of the liver. No other focal abnormality is noted. Musculoskeletal: Degenerative changes of the thoracic spine are seen. No acute bony abnormality is noted. Review of the MIP images confirms the above findings. IMPRESSION: No definitive pulmonary emboli are seen. Patchy airspace opacity is noted bilaterally likely representing multifocal infiltrate. The right upper lobe changes mimic a pulmonary infarct although no definitive embolus is seen. Fatty liver. Aortic Atherosclerosis (ICD10-I70.0) and Emphysema (ICD10-J43.9). Electronically Signed   By: Inez Catalina M.D.   On: 09/19/2020 19:28   DG Chest Port 1 View  Result Date: 09/19/2020 CLINICAL DATA:  Shortness of breath.  Hypoxia. EXAM: PORTABLE CHEST 1 VIEW COMPARISON:  01/08/2020 FINDINGS: Focal airspace consolidation noted peripheral right mid lung along the minor fissure laterally. There is also new left base collapse/consolidative opacity. No evidence for pleural effusion. The cardio pericardial silhouette is enlarged. The visualized bony structures of the thorax show no acute abnormality. IMPRESSION: New bilateral airspace disease, left greater than right suggesting multifocal pneumonia. Imaging follow-up recommended to ensure resolution. Electronically Signed   By: Misty Stanley M.D.   On: 09/19/2020 17:07    Scheduled Meds:  allopurinol  50 mg Oral Daily   azithromycin  500 mg Oral Daily   budesonide  0.5 mg Nebulization BID   buPROPion  300 mg Oral Daily   Chlorhexidine Gluconate Cloth  6 each Topical Q0600   DULoxetine  60 mg Oral Daily   enoxaparin (LOVENOX) injection  40 mg Subcutaneous Q24H   furosemide  60 mg Oral BID   gabapentin  300 mg Oral QHS   insulin aspart  0-15 Units Subcutaneous TID AC & HS   insulin glargine  88 Units Subcutaneous Daily   ipratropium-albuterol  3 mL Nebulization Q6H   methylPREDNISolone (SOLU-MEDROL) injection  60  mg Intravenous Q12H   mupirocin ointment  1 application Nasal BID   pantoprazole  40 mg Oral QHS   propranolol  10 mg Oral QHS   rOPINIRole  4 mg Oral BID   spironolactone  25 mg Oral Daily   Continuous Infusions:  cefTRIAXone (ROCEPHIN)  IV 2 g (09/20/20 1536)     LOS: 0 days   Time spent: 50mn  Latalia Etzler C Iline Buchinger, DO [facility]  If 7PM-7AM, please contact night-coverage [email or web address]  09/21/2020, 7:58 AM

## 2020-09-22 DIAGNOSIS — J189 Pneumonia, unspecified organism: Secondary | ICD-10-CM | POA: Diagnosis not present

## 2020-09-22 LAB — CBC
HCT: 39.2 % (ref 36.0–46.0)
Hemoglobin: 12.8 g/dL (ref 12.0–15.0)
MCH: 30 pg (ref 26.0–34.0)
MCHC: 32.7 g/dL (ref 30.0–36.0)
MCV: 92 fL (ref 80.0–100.0)
Platelets: 333 10*3/uL (ref 150–400)
RBC: 4.26 MIL/uL (ref 3.87–5.11)
RDW: 14.5 % (ref 11.5–15.5)
WBC: 15.9 10*3/uL — ABNORMAL HIGH (ref 4.0–10.5)
nRBC: 0 % (ref 0.0–0.2)

## 2020-09-22 LAB — BASIC METABOLIC PANEL
Anion gap: 8 (ref 5–15)
BUN: 34 mg/dL — ABNORMAL HIGH (ref 8–23)
CO2: 36 mmol/L — ABNORMAL HIGH (ref 22–32)
Calcium: 9.9 mg/dL (ref 8.9–10.3)
Chloride: 93 mmol/L — ABNORMAL LOW (ref 98–111)
Creatinine, Ser: 1.37 mg/dL — ABNORMAL HIGH (ref 0.44–1.00)
GFR, Estimated: 43 mL/min — ABNORMAL LOW (ref >60)
Glucose, Bld: 232 mg/dL — ABNORMAL HIGH (ref 70–99)
Potassium: 4.5 mmol/L (ref 3.5–5.1)
Sodium: 137 mmol/L (ref 135–145)

## 2020-09-22 LAB — GLUCOSE, CAPILLARY
Glucose-Capillary: 283 mg/dL — ABNORMAL HIGH (ref 70–99)
Glucose-Capillary: 361 mg/dL — ABNORMAL HIGH (ref 70–99)

## 2020-09-22 MED ORDER — AZITHROMYCIN 250 MG PO TABS: ORAL_TABLET | ORAL | 0 refills | Status: DC

## 2020-09-22 MED ORDER — GUAIFENESIN-DM 100-10 MG/5ML PO SYRP: 5.0000 mL | ORAL_SOLUTION | ORAL | 0 refills | Status: DC | PRN

## 2020-09-22 MED ORDER — PREDNISONE 10 MG PO TABS: ORAL_TABLET | ORAL | 0 refills | Status: AC

## 2020-09-22 NOTE — Discharge Summary (Signed)
Physician Discharge Summary  Kathleen FICKEL TJQ:[id number] DOB: [birth year] DOA: 09/19/2020  PCP: [name], MD  Admit date: 09/19/2020 Discharge date: 09/22/2020  Admitted From: Home Disposition: Home  Recommendations for Outpatient Follow-up:  Follow up with PCP in 1-2 weeks Please obtain BMP/CBC in one week Please follow up with pulmonology and cardiology as scheduled  Home Health: None Equipment/Devices: None  Discharge Condition: Stable CODE STATUS: Full Diet recommendation: Diabetic diet  Brief/Interim Summary: 65 year old female with past medical history of COPD, asthma, diastolic congestive heart failure (Echo 05/2019 EF 70% with G1DD), OSA on CPAP, hypertension, obesity, insulin-dependent diabetes mellitus type 2, hyperlipidemia, gout, gastroesophageal reflux disease who presents to [facility] emergency department with complaints of shortness of breath and cough -worsening over the previous 5 days without clear provoking incident.  She also complains of right-sided pleuritic chest pain with deep inspiration and cough.  Was seen in the outpatient setting on the 13th given oral prednisone but despite this and increased bronchodilators at home she continued to worsen with increasing dyspnea at rest and exertion.  In the ED patient found to be hypoxic requiring supplemental oxygen to maintain sats even at rest, CTA at intake was unremarkable for embolism but did show multifocal pneumonia.  Patient admitted for pneumonia with likely concurrent COPD/asthma exacerbation.  Patient admitted as above with acute respiratory failure, initially hypoxic from baseline likely secondary to commune acquired pneumonia, cannot rule out concurrent COPD/asthma exacerbation.  Patient improved quickly over the last 48 hours, hypoxia resolved, patient remains somewhat dyspneic with exertion, she is due to follow-up with both pulmonology and cardiology in the next few weeks.  At this time given  resolution of hypoxia with supportive care including steroids and antibiotics will discharge patient on remainder of antibiotic course as well as steroid taper until she can follow-up with pulmonology later this week.  We discussed some of her dyspnea may be related to general debility given her previous issues with bilateral lower extremity pain and limited mobility she is likely become somewhat deconditioned as well.  Recommended increased activity as tolerated and close follow-up as outlined above.  Discharge Diagnoses:  Principal Problem:   Pneumonia of both lower lobes due to infectious organism Active Problems:   Essential hypertension   GERD without esophagitis   OSA on CPAP   Chronic diastolic heart failure (HCC)   Type 2 diabetes mellitus with hyperglycemia, without long-term current use of insulin (HCC)   Gout   Acute exacerbation of COPD with asthma ([place])   Mixed diabetic hyperlipidemia associated with type 2 diabetes mellitus ([place])   COPD exacerbation ([place])    Discharge Instructions  Discharge Instructions     Call MD for:  difficulty breathing, headache or visual disturbances   Complete by: As directed    Diet - low sodium heart healthy   Complete by: As directed    Increase activity slowly   Complete by: As directed    No wound care   Complete by: As directed       Allergies as of 09/22/2020       Reactions   Almond Oil Anaphylaxis, Shortness Of Breath, Swelling   Morphine And Related Shortness Of Breath, Other (See Comments)   Pt. States while in the hospital it affected her breathing, O2 dropped to the 70's   Atorvastatin Other (See Comments)   Leg weakness   Ceclor [cefaclor] Nausea And Vomiting   Ciprofloxacin Other (See Comments)   Makes joints and muscles ache  Levaquin [levofloxacin] Other (See Comments)   Body aches   Losartan Other (See Comments)   Weakness   Statins Other (See Comments)   Leg and body weakness   Sulfa Antibiotics Nausea And  Vomiting        Medication List     TAKE these medications    acetaminophen 500 MG tablet Commonly known as: TYLENOL Take 500 mg by mouth every 6 (six) hours as needed for moderate pain.   albuterol 108 (90 Base) MCG/ACT inhaler Commonly known as: VENTOLIN HFA Inhale 2 puffs into the lungs every 6 (six) hours as needed. What changed: reasons to take this   allopurinol 100 MG tablet Commonly known as: Zyloprim Take 0.5 tablets (50 mg total) by mouth daily.   azithromycin 250 MG tablet Commonly known as: Zithromax Take 1 tablet PO QD until completed - start 09/23/20   Bayer Contour Next Test test strip Generic drug: glucose blood 1 each by Other route daily. As directed   budesonide 0.5 MG/2ML nebulizer solution Commonly known as: PULMICORT Take 2 mLs (0.5 mg total) by nebulization 2 (two) times daily.   buPROPion 300 MG 24 hr tablet Commonly known as: WELLBUTRIN XL TAKE 1 TABLET BY MOUTH DAILY What changed: how much to take   colchicine 0.6 MG tablet Take 1 tablet (0.6 mg total) by mouth daily as needed (for gout).   dicyclomine 20 MG tablet Commonly known as: BENTYL TAKE 1 TABLET UP TO FOUR TIMES A DAY FOR GI CRAMPING, PAIN, AND NAUSEA/VOMITING AS NEEDED What changed: See the new instructions.   DULoxetine 60 MG capsule Commonly known as: CYMBALTA TAKE 1 CAPSULE BY MOUTH ONCE DAILY.   furosemide 20 MG tablet Commonly known as: LASIX Take 3 tablets (60 mg total) by mouth 2 (two) times daily.   gabapentin 100 MG capsule Commonly known as: NEURONTIN TAKE 3 CAPSULES BY MOUTH AT BEDTIME. What changed:  when to take this additional instructions   Global Ease Inject Pen Needles 32G X 4 MM Misc Generic drug: Insulin Pen Needle USE AS DIRECTED WITH VICTOZA AND TRESIBA.   guaiFENesin-dextromethorphan 100-10 MG/5ML syrup Commonly known as: ROBITUSSIN DM Take 5 mLs by mouth every 4 (four) hours as needed for cough.   ipratropium-albuterol 0.5-2.5 (3) MG/3ML  Soln Commonly known as: DUONEB Take 3 mLs by nebulization every 6 (six) hours as needed (wheezing).   ketoconazole 2 % cream Commonly known as: NIZORAL Apply 1 application topically 2 (two) times daily.   meloxicam 15 MG tablet Commonly known as: MOBIC TAKE 1 TABLET (15 MG TOTAL) BY MOUTH DAILY. What changed:  when to take this reasons to take this   mesalamine 1.2 g EC tablet Commonly known as: LIALDA Take 1.2 g by mouth 2 (two) times daily.   metolazone 5 MG tablet Commonly known as: ZAROXOLYN TAKE 1 TABLET BY MOUTH AS NEEDED. What changed:  when to take this reasons to take this   Microlet Lancets Misc 1 each by Other route. As directed   ondansetron 4 MG tablet Commonly known as: ZOFRAN Take 1 tablet (4 mg total) by mouth every 6 (six) hours. What changed:  when to take this reasons to take this   pantoprazole 40 MG tablet Commonly known as: PROTONIX TAKE 1 TABLET BY MOUTH AT BEDTIME What changed: when to take this   polyethylene glycol 17 g packet Commonly known as: MIRALAX / GLYCOLAX Take 17 g by mouth daily as needed for mild constipation.   potassium chloride 10 MEQ  CR capsule Commonly known as: MICRO-K TAKE 5 CAPSULES (50 MEQ TOTAL) BY MOUTH 2 (TWO) TIMES DAILY.   predniSONE 10 MG tablet Commonly known as: DELTASONE Take 4 tablets (40 mg total) by mouth daily for 3 days, THEN 3 tablets (30 mg total) daily for 3 days, THEN 2 tablets (20 mg total) daily for 3 days, THEN 1 tablet (10 mg total) daily for 3 days. Start taking on: September 22, 2020 What changed:  medication strength See the new instructions.   propranolol 10 MG tablet Commonly known as: INDERAL TAKE 1 TABLET BY MOUTH 2 TIMES DAILY What changed: when to take this   rOPINIRole 4 MG tablet Commonly known as: REQUIP TAKE 1 TABLET BY MOUTH TWICE DAILY What changed: additional instructions   spironolactone 25 MG tablet Commonly known as: ALDACTONE TAKE 1 TABLET BY MOUTH DAILY.    traMADol 50 MG tablet Commonly known as: ULTRAM Take 1 tablet (50 mg total) by mouth 3 (three) times daily as needed. What changed: reasons to take this   [place] FlexTouch 200 UNIT/ML FlexTouch Pen Generic drug: insulin degludec INJECT 88 UNITS UNDER SKIN ONCE DAILY What changed:  how much to take how to take this when to take this additional instructions   Trulicity 3 DX/8.3JA Sopn Generic drug: Dulaglutide Inject 3 mg as directed once a week.   UNABLE TO FIND CPAP- At bedtime   Vitamin D-3 125 MCG (5000 UT) Tabs Take 5,000 Units by mouth every other day.   Yupelri 175 MCG/3ML nebulizer solution Generic drug: revefenacin Take 3 mLs (175 mcg total) by nebulization daily.        Allergies  Allergen Reactions   Almond Oil Anaphylaxis, Shortness Of Breath and Swelling   Morphine And Related Shortness Of Breath and Other (See Comments)    Pt. States while in the hospital it affected her breathing, O2 dropped to the 70's   Atorvastatin Other (See Comments)    Leg weakness   Ceclor [Cefaclor] Nausea And Vomiting   Ciprofloxacin Other (See Comments)    Makes joints and muscles ache   Levaquin [Levofloxacin] Other (See Comments)    Body aches   Losartan Other (See Comments)    Weakness    Statins Other (See Comments)    Leg and body weakness   Sulfa Antibiotics Nausea And Vomiting    Consultations: None   Procedures/Studies: CT Angio Chest PE W and/or Wo Contrast  Result Date: 09/19/2020 CLINICAL DATA:  Shortness of breath and cough with positive D-dimer, initial encounter EXAM: CT ANGIOGRAPHY CHEST WITH CONTRAST TECHNIQUE: Multidetector CT imaging of the chest was performed using the standard protocol during bolus administration of intravenous contrast. Multiplanar CT image reconstructions and MIPs were obtained to evaluate the vascular anatomy. CONTRAST:  115m OMNIPAQUE IOHEXOL 350 MG/ML SOLN COMPARISON:  Chest x-ray from earlier in the same day. FINDINGS:  Cardiovascular: Thoracic aorta and its branches demonstrate atherosclerotic calcification without aneurysmal dilatation. No evidence of dissection is seen. No cardiac enlargement is noted. The pulmonary artery is well visualized bilaterally within normal branching pattern. No definitive intraluminal filling defect to suggest pulmonary embolism is seen. Mediastinum/Nodes: Thoracic inlet is within normal limits. No sizable hilar or mediastinal adenopathy is noted. The esophagus as visualized is within normal limits. Lungs/Pleura: Lungs are well aerated bilaterally. Patchy airspace opacity is noted in the lingula and left lower lobe as well as in the right upper lobe along the minor fissure anteriorly in the right upper lobe. Right upper lobe changes suggests  the possibility of pulmonary infarct although no discrete sizable embolus is noted. Mild emphysematous changes are noted. No sizable effusion is seen. No sizable parenchymal nodule is noted. Upper Abdomen: Visualized upper abdomen shows fatty infiltration of the liver. No other focal abnormality is noted. Musculoskeletal: Degenerative changes of the thoracic spine are seen. No acute bony abnormality is noted. Review of the MIP images confirms the above findings. IMPRESSION: No definitive pulmonary emboli are seen. Patchy airspace opacity is noted bilaterally likely representing multifocal infiltrate. The right upper lobe changes mimic a pulmonary infarct although no definitive embolus is seen. Fatty liver. Aortic Atherosclerosis (ICD10-I70.0) and Emphysema (ICD10-J43.9). Electronically Signed   By: Inez Catalina M.D.   On: 09/19/2020 19:28   DG Chest Port 1 View  Result Date: 09/19/2020 CLINICAL DATA:  Shortness of breath.  Hypoxia. EXAM: PORTABLE CHEST 1 VIEW COMPARISON:  01/08/2020 FINDINGS: Focal airspace consolidation noted peripheral right mid lung along the minor fissure laterally. There is also new left base collapse/consolidative opacity. No evidence  for pleural effusion. The cardio pericardial silhouette is enlarged. The visualized bony structures of the thorax show no acute abnormality. IMPRESSION: New bilateral airspace disease, left greater than right suggesting multifocal pneumonia. Imaging follow-up recommended to ensure resolution. Electronically Signed   By: Misty Stanley M.D.   On: 09/19/2020 17:07     Subjective: No acute issues or events overnight   Discharge Exam: Vitals:   09/22/20 0808 09/22/20 1109  BP:  121/74  Pulse: 71 67  Resp: 17 16  Temp:  98 F (36.7 C)  SpO2: 95% 95%   Vitals:   09/21/20 2133 09/22/20 0553 09/22/20 0808 09/22/20 1109  BP:  126/75  121/74  Pulse:  68 71 67  Resp:  18 17 16   Temp:  98.1 F (36.7 C)  98 F (36.7 C)  TempSrc:  Oral  Oral  SpO2: 95% 93% 95% 95%  Weight:  123.2 kg    Height:        General: Pt is alert, awake, not in acute distress Cardiovascular: RRR, S1/S2 +, no rubs, no gallops Respiratory: CTA bilaterally, no wheezing, no rhonchi Abdominal: Soft, NT, ND, bowel sounds + Extremities: no edema, no cyanosis    The results of significant diagnostics from this hospitalization (including imaging, microbiology, ancillary and laboratory) are listed below for reference.     Microbiology: Recent Results (from the past 240 hour(s))  Resp Panel by RT-PCR (Flu A&B, Covid) Nasopharyngeal Swab     Status: None   Collection Time: 09/19/20  3:30 PM   Specimen: Nasopharyngeal Swab; Nasopharyngeal(NP) swabs in vial transport medium  Result Value Ref Range Status   SARS Coronavirus 2 by RT PCR NEGATIVE NEGATIVE Final    Comment: (NOTE) SARS-CoV-2 target nucleic acids are NOT DETECTED.  The SARS-CoV-2 RNA is generally detectable in upper respiratory specimens during the acute phase of infection. The lowest concentration of SARS-CoV-2 viral copies this assay can detect is 138 copies/mL. A negative result does not preclude SARS-Cov-2 infection and should not be used as the sole  basis for treatment or other patient management decisions. A negative result may occur with  improper specimen collection/handling, submission of specimen other than nasopharyngeal swab, presence of viral mutation(s) within the areas targeted by this assay, and inadequate number of viral copies(<138 copies/mL). A negative result must be combined with clinical observations, patient history, and epidemiological information. The expected result is Negative.  Fact Sheet for Patients:  EntrepreneurPulse.com.au  Fact Sheet for Healthcare Providers:  IncredibleEmployment.be  This test is no t yet approved or cleared by the [place] and  has been authorized for detection and/or diagnosis of SARS-CoV-2 by FDA under an Emergency Use Authorization (EUA). This EUA will remain  in effect (meaning this test can be used) for the duration of the COVID-19 declaration under Section 564(b)(1) of the Act, 21 U.S.C.section 360bbb-3(b)(1), unless the authorization is terminated  or revoked sooner.       Influenza A by PCR NEGATIVE NEGATIVE Final   Influenza B by PCR NEGATIVE NEGATIVE Final    Comment: (NOTE) The Xpert Xpress SARS-CoV-2/FLU/RSV plus assay is intended as an aid in the diagnosis of influenza from Nasopharyngeal swab specimens and should not be used as a sole basis for treatment. Nasal washings and aspirates are unacceptable for Xpert Xpress SARS-CoV-2/FLU/RSV testing.  Fact Sheet for Patients: EntrepreneurPulse.com.au  Fact Sheet for Healthcare Providers: IncredibleEmployment.be  This test is not yet approved or cleared by the [place] FDA and has been authorized for detection and/or diagnosis of SARS-CoV-2 by FDA under an Emergency Use Authorization (EUA). This EUA will remain in effect (meaning this test can be used) for the duration of the COVID-19 declaration under Section 564(b)(1) of the Act,  21 U.S.C. section 360bbb-3(b)(1), unless the authorization is terminated or revoked.  Performed at [facility], [place] [address], [place], [place] [address]   Culture, blood (routine x 2) Call MD if unable to obtain prior to antibiotics being given     Status: None (Preliminary result)   Collection Time: 09/20/20  1:14 AM   Specimen: BLOOD  Result Value Ref Range Status   Specimen Description BLOOD LEFT ANTECUBITAL  Final   Special Requests   Final    BOTTLES DRAWN AEROBIC AND ANAEROBIC Blood Culture adequate volume   Culture   Final    NO GROWTH 1 DAY Performed at [facility], [place] [address], [place], [place] [address]    Report Status PENDING  Incomplete  Culture, blood (routine x 2) Call MD if unable to obtain prior to antibiotics being given     Status: None (Preliminary result)   Collection Time: 09/20/20  1:14 AM   Specimen: BLOOD LEFT ARM  Result Value Ref Range Status   Specimen Description BLOOD LEFT ARM  Final   Special Requests   Final    BOTTLES DRAWN AEROBIC AND ANAEROBIC Blood Culture adequate volume   Culture   Final    NO GROWTH 1 DAY Performed at [facility], [place] [address], [place], [place] [address]    Report Status PENDING  Incomplete  MRSA Next Gen by PCR, Nasal     Status: Abnormal   Collection Time: 09/20/20  1:40 AM  Result Value Ref Range Status   MRSA by PCR Next Gen DETECTED (A) NOT DETECTED Final    Comment: RESULT CALLED TO, READ BACK BY AND VERIFIED WITH: Eston Esters [phone] 09/20/2020 T. TYSOR (NOTE) The GeneXpert MRSA Assay (FDA approved for NASAL specimens only), is one component of a comprehensive MRSA colonization surveillance program. It is not intended to diagnose MRSA infection nor to guide or monitor treatment for MRSA infections. Test performance is not FDA approved in patients less than 45 years old. Performed at [facility], [place] [address], [place], [place] [address]      Labs: BNP (last  3 results) Recent Labs    01/08/20 1229 09/19/20 1529  BNP 58.9 79.8   Basic  Metabolic Panel: Recent Labs  Lab 09/19/20 1529 09/20/20 0114 09/21/20 0304 09/22/20 0340  NA 136 136 137 137  K 3.4* 4.7 4.4 4.5  CL 95* 96* 98 93*  CO2 30 29 28  36*  GLUCOSE 260* 345* 214* 232*  BUN 26* 22 30* 34*  CREATININE 1.15* 1.42* 1.23* 1.37*  CALCIUM 9.0 9.5 9.7 9.9  MG  --  2.2  --   --    Liver Function Tests: Recent Labs  Lab 09/19/20 1529 09/20/20 0114  AST 18 21  ALT 17 23  ALKPHOS 84 83  BILITOT 0.5 0.4  PROT 7.2 7.2  ALBUMIN 3.6 3.3*   No results for input(s): LIPASE, AMYLASE in the last 168 hours. No results for input(s): AMMONIA in the last 168 hours. CBC: Recent Labs  Lab 09/19/20 1529 09/20/20 0114 09/21/20 0304 09/22/20 0340  WBC 17.4* 17.0* 14.2* 15.9*  NEUTROABS 11.4* 14.6*  --   --   HGB 12.4 13.0 11.8* 12.8  HCT 37.9 38.7 35.5* 39.2  MCV 93.1 91.9 93.4 92.0  PLT 292 274 277 333   Cardiac Enzymes: No results for input(s): CKTOTAL, CKMB, CKMBINDEX, TROPONINI in the last 168 hours. BNP: Invalid input(s): POCBNP CBG: Recent Labs  Lab 09/21/20 1149 09/21/20 1638 09/21/20 2125 09/22/20 0731 09/22/20 1122  GLUCAP 305* 374* 328* 283* 361*   D-Dimer No results for input(s): DDIMER in the last 72 hours.  Hgb A1c Recent Labs    09/20/20 0114  HGBA1C 8.5*   Lipid Profile No results for input(s): CHOL, HDL, LDLCALC, TRIG, CHOLHDL, LDLDIRECT in the last 72 hours. Thyroid function studies No results for input(s): TSH, T4TOTAL, T3FREE, THYROIDAB in the last 72 hours.  Invalid input(s): FREET3 Anemia work up No results for input(s): VITAMINB12, FOLATE, FERRITIN, TIBC, IRON, RETICCTPCT in the last 72 hours. Urinalysis    Component Value Date/Time   COLORURINE STRAW (A) 03/12/2019 1906   APPEARANCEUR CLEAR 03/12/2019 1906   LABSPEC 1.009 03/12/2019 1906   PHURINE 6.0 03/12/2019 1906   GLUCOSEU 150 (A) 03/12/2019 1906   HGBUR NEGATIVE 03/12/2019  1906   BILIRUBINUR negative 12/25/2019 1515   KETONESUR NEGATIVE 03/12/2019 1906   PROTEINUR Positive (A) 12/25/2019 1515   PROTEINUR NEGATIVE 03/12/2019 1906   UROBILINOGEN negative (A) 12/25/2019 1515   UROBILINOGEN 0.2 09/19/2008 1400   NITRITE negative 12/25/2019 1515   NITRITE NEGATIVE 03/12/2019 1906   LEUKOCYTESUR Large (3+) (A) 12/25/2019 1515   LEUKOCYTESUR NEGATIVE 03/12/2019 1906   Sepsis Labs Invalid input(s): PROCALCITONIN,  WBC,  LACTICIDVEN Microbiology Recent Results (from the past 240 hour(s))  Resp Panel by RT-PCR (Flu A&B, Covid) Nasopharyngeal Swab     Status: None   Collection Time: 09/19/20  3:30 PM   Specimen: Nasopharyngeal Swab; Nasopharyngeal(NP) swabs in vial transport medium  Result Value Ref Range Status   SARS Coronavirus 2 by RT PCR NEGATIVE NEGATIVE Final    Comment: (NOTE) SARS-CoV-2 target nucleic acids are NOT DETECTED.  The SARS-CoV-2 RNA is generally detectable in upper respiratory specimens during the acute phase of infection. The lowest concentration of SARS-CoV-2 viral copies this assay can detect is 138 copies/mL. A negative result does not preclude SARS-Cov-2 infection and should not be used as the sole basis for treatment or other patient management decisions. A negative result may occur with  improper specimen collection/handling, submission of specimen other than nasopharyngeal swab, presence of viral mutation(s) within the areas targeted by this assay, and inadequate number of viral copies(<138 copies/mL). A negative result must  be combined with clinical observations, patient history, and epidemiological information. The expected result is Negative.  Fact Sheet for Patients:  EntrepreneurPulse.com.au  Fact Sheet for Healthcare Providers:  IncredibleEmployment.be  This test is no t yet approved or cleared by the [place] FDA and  has been authorized for detection and/or diagnosis of  SARS-CoV-2 by FDA under an Emergency Use Authorization (EUA). This EUA will remain  in effect (meaning this test can be used) for the duration of the COVID-19 declaration under Section 564(b)(1) of the Act, 21 U.S.C.section 360bbb-3(b)(1), unless the authorization is terminated  or revoked sooner.       Influenza A by PCR NEGATIVE NEGATIVE Final   Influenza B by PCR NEGATIVE NEGATIVE Final    Comment: (NOTE) The Xpert Xpress SARS-CoV-2/FLU/RSV plus assay is intended as an aid in the diagnosis of influenza from Nasopharyngeal swab specimens and should not be used as a sole basis for treatment. Nasal washings and aspirates are unacceptable for Xpert Xpress SARS-CoV-2/FLU/RSV testing.  Fact Sheet for Patients: EntrepreneurPulse.com.au  Fact Sheet for Healthcare Providers: IncredibleEmployment.be  This test is not yet approved or cleared by the [place] FDA and has been authorized for detection and/or diagnosis of SARS-CoV-2 by FDA under an Emergency Use Authorization (EUA). This EUA will remain in effect (meaning this test can be used) for the duration of the COVID-19 declaration under Section 564(b)(1) of the Act, 21 U.S.C. section 360bbb-3(b)(1), unless the authorization is terminated or revoked.  Performed at [facility], [place] [address] [place], [place] [address]   Culture, blood (routine x 2) Call MD if unable to obtain prior to antibiotics being given     Status: None (Preliminary result)   Collection Time: 09/20/20  1:14 AM   Specimen: BLOOD  Result Value Ref Range Status   Specimen Description BLOOD LEFT ANTECUBITAL  Final   Special Requests   Final    BOTTLES DRAWN AEROBIC AND ANAEROBIC Blood Culture adequate volume   Culture   Final    NO GROWTH 1 DAY Performed at [facility], [place] [address], [place], [place] [address]    Report Status PENDING  Incomplete  Culture, blood (routine x 2) Call MD if  unable to obtain prior to antibiotics being given     Status: None (Preliminary result)   Collection Time: 09/20/20  1:14 AM   Specimen: BLOOD LEFT ARM  Result Value Ref Range Status   Specimen Description BLOOD LEFT ARM  Final   Special Requests   Final    BOTTLES DRAWN AEROBIC AND ANAEROBIC Blood Culture adequate volume   Culture   Final    NO GROWTH 1 DAY Performed at [facility], [place] [address] [place], [place] [address]    Report Status PENDING  Incomplete  MRSA Next Gen by PCR, Nasal     Status: Abnormal   Collection Time: 09/20/20  1:40 AM  Result Value Ref Range Status   MRSA by PCR Next Gen DETECTED (A) NOT DETECTED Final    Comment: RESULT CALLED TO, READ BACK BY AND VERIFIED WITH: Eston Esters [phone] 09/20/2020 T. TYSOR (NOTE) The GeneXpert MRSA Assay (FDA approved for NASAL specimens only), is one component of a comprehensive MRSA colonization surveillance program. It is not intended to diagnose MRSA infection nor to guide or monitor treatment for MRSA infections. Test performance is not FDA approved in patients less than 70 years old. Performed at [facility], [place] [address]  [place], [place] [address]      Time coordinating discharge: Over 30 minutes  SIGNED:   Little Ishikawa, DO [facility] 09/22/2020, 4:12 PM Pager   If 7PM-7AM, please contact night-coverage [email or web address]

## 2020-09-22 NOTE — Progress Notes (Signed)
DISCHARGE NOTE HOME Kathleen Hatfield to be discharged [place] to be discharged Home per MD order. Discussed prescriptions and follow up appointments with the patient. Prescriptions given to patient; medication list explained in detail. Patient verbalized understanding.  Skin clean, dry and intact without evidence of skin break down, no evidence of skin tears noted. IV catheter discontinued intact. Site without signs and symptoms of complications. Dressing and pressure applied. Pt denies pain at the site currently. No complaints noted.  Patient free of lines, drains, and wounds.   An After Visit Summary (AVS) was printed and given to the patient. Patient escorted via wheelchair, and discharged home via private auto.  [place], Zenon Mayo, RN

## 2020-09-22 NOTE — Plan of Care (Signed)
  Problem: Education: Goal: Knowledge of General Education information will improve Description: Including pain rating scale, medication(s)/side effects and non-pharmacologic comfort measures Outcome: Adequate for Discharge   Problem: Health Behavior/Discharge Planning: Goal: Ability to manage health-related needs will improve Outcome: Adequate for Discharge   Problem: Clinical Measurements: Goal: Ability to maintain clinical measurements within normal limits will improve Outcome: Adequate for Discharge Goal: Will remain free from infection Outcome: Adequate for Discharge Goal: Diagnostic test results will improve Outcome: Adequate for Discharge Goal: Respiratory complications will improve Outcome: Adequate for Discharge Goal: Cardiovascular complication will be avoided Outcome: Adequate for Discharge   Problem: Nutrition: Goal: Adequate nutrition will be maintained Outcome: Adequate for Discharge   Problem: Elimination: Goal: Will not experience complications related to bowel motility Outcome: Adequate for Discharge Goal: Will not experience complications related to urinary retention Outcome: Adequate for Discharge   Problem: Safety: Goal: Ability to remain free from injury will improve Outcome: Adequate for Discharge   Problem: Skin Integrity: Goal: Risk for impaired skin integrity will decrease Outcome: Adequate for Discharge

## 2020-09-22 NOTE — Progress Notes (Signed)
SATURATION QUALIFICATIONS: (This note is used to comply with regulatory documentation for home oxygen)  Patient Saturations on Room Air at Rest =  95 %  Patient Saturations on Room Air while Ambulating = 90 %  Patient Saturations oxygen  N/A Liters of oxygen while Ambulating =   n/a%  Please briefly explain why patient needs home oxygen:

## 2020-09-23 ENCOUNTER — Telehealth: Payer: Self-pay

## 2020-09-23 LAB — GLUCOSE, CAPILLARY: Glucose-Capillary: 247 mg/dL — ABNORMAL HIGH (ref 70–99)

## 2020-09-23 LAB — LEGIONELLA PNEUMOPHILA SEROGP 1 UR AG: L. pneumophila Serogp 1 Ur Ag: NEGATIVE

## 2020-09-23 NOTE — Telephone Encounter (Signed)
Transition Care Management Follow-up Telephone Call Date of discharge and from where: 09/22/2020, Kathleen Hatfield  How have you been since you were released from the hospital? Patient states that she is feeling better. Still dealing with the cough Any questions or concerns? No  Items Reviewed: Did the pt receive and understand the discharge instructions provided? Yes  Medications obtained and verified? Yes  Other? No  Any new allergies since your discharge? No  Dietary orders reviewed? Yes Do you have support at home? Yes   Home Care and Equipment/Supplies: Were home health services ordered? not applicable If so, what is the name of the agency? N/A  Has the agency set up a time to come to the patient's home? not applicable Were any new equipment or medical supplies ordered?  No What is the name of the medical supply agency? N/A Were you able to get the supplies/equipment? not applicable Do you have any questions related to the use of the equipment or supplies? No  Functional Questionnaire: (I = Independent and D = Dependent) ADLs: I  Bathing/Dressing- I  Meal Prep- I  Eating- I  Maintaining continence- I  Transferring/Ambulation- I  Managing Meds- I  Follow up appointments reviewed:  PCP Hospital f/u appt confirmed? Yes  Scheduled to see Dr. [name] on 09/26/2020 @ 2:30 pm. [facility] f/u appt confirmed?  Follow up with cardiology  Are transportation arrangements needed? No  If their condition worsens, is the pt aware to call PCP or go to the Emergency Dept.? Yes Was the patient provided with contact information for the PCP's office or ED? Yes Was to pt encouraged to call back with questions or concerns? Yes

## 2020-09-25 ENCOUNTER — Other Ambulatory Visit: Payer: Self-pay | Admitting: Family Medicine

## 2020-09-25 ENCOUNTER — Telehealth: Payer: Self-pay

## 2020-09-25 LAB — CULTURE, BLOOD (ROUTINE X 2)
Culture: NO GROWTH
Culture: NO GROWTH
Special Requests: ADEQUATE
Special Requests: ADEQUATE

## 2020-09-25 NOTE — Telephone Encounter (Signed)
Refill request for Gabapentin 100 mg caps  LOV - 06/18/20 Next OV - 09/26/20 Last refill - 03/20/20 #270/1

## 2020-09-25 NOTE — Telephone Encounter (Signed)
Sent. Thanks.   

## 2020-09-25 NOTE — Progress Notes (Addendum)
Chronic Care Management Pharmacy Assistant   Name: Kathleen Hatfield  MRN: [id number] DOB: 03-14-1955  Reason for Encounter: Reminder Call   Conditions to be addressed/monitored: HTN, HLD, COPD, DMII, and CKD Stage 2   Medications: Outpatient Encounter Medications as of 09/25/2020  Medication Sig Note   acetaminophen (TYLENOL) 500 MG tablet Take 500 mg by mouth every 6 (six) hours as needed for moderate pain.    albuterol (VENTOLIN HFA) 108 (90 Base) MCG/ACT inhaler Inhale 2 puffs into the lungs every 6 (six) hours as needed.    allopurinol (ZYLOPRIM) 100 MG tablet Take 0.5 tablets (50 mg total) by mouth daily.    azithromycin (ZITHROMAX) 250 MG tablet Take 1 tablet PO QD until completed - start 09/23/20    BAYER CONTOUR NEXT TEST test strip 1 each by Other route daily. As directed    budesonide (PULMICORT) 0.5 MG/2ML nebulizer solution Take 2 mLs (0.5 mg total) by nebulization 2 (two) times daily.    buPROPion (WELLBUTRIN XL) 300 MG 24 hr tablet TAKE 1 TABLET BY MOUTH DAILY    Cholecalciferol (VITAMIN D-3) 5000 UNITS TABS Take 5,000 Units by mouth every other day.     colchicine 0.6 MG tablet Take 1 tablet (0.6 mg total) by mouth daily as needed (for gout).    dicyclomine (BENTYL) 20 MG tablet TAKE 1 TABLET UP TO FOUR TIMES A DAY FOR GI CRAMPING, PAIN, AND NAUSEA/VOMITING AS NEEDED    Dulaglutide (TRULICITY) 3 TL/5.7WI SOPN Inject 3 mg as directed once a week.    DULoxetine (CYMBALTA) 60 MG capsule TAKE 1 CAPSULE BY MOUTH ONCE DAILY.    furosemide (LASIX) 20 MG tablet Take 3 tablets (60 mg total) by mouth 2 (two) times daily.    gabapentin (NEURONTIN) 100 MG capsule TAKE 3 CAPSULES BY MOUTH AT BEDTIME.    GLOBAL EASE INJECT PEN NEEDLES 32G X 4 MM MISC USE AS DIRECTED WITH VICTOZA AND TRESIBA.    guaiFENesin-dextromethorphan (ROBITUSSIN DM) 100-10 MG/5ML syrup Take 5 mLs by mouth every 4 (four) hours as needed for cough.    insulin degludec (TRESIBA FLEXTOUCH) 200 UNIT/ML FlexTouch Pen  INJECT 88 UNITS UNDER SKIN ONCE DAILY    ipratropium-albuterol (DUONEB) 0.5-2.5 (3) MG/3ML SOLN Take 3 mLs by nebulization every 6 (six) hours as needed (wheezing).    ketoconazole (NIZORAL) 2 % cream Apply 1 application topically 2 (two) times daily.    meloxicam (MOBIC) 15 MG tablet TAKE 1 TABLET (15 MG TOTAL) BY MOUTH DAILY.    mesalamine (LIALDA) 1.2 g EC tablet Take 1.2 g by mouth 2 (two) times daily. 08/12/2020: Patient states she takes 1 in the am and 1 in pm.    metolazone (ZAROXOLYN) 5 MG tablet TAKE 1 TABLET BY MOUTH AS NEEDED.    MICROLET LANCETS MISC 1 each by Other route. As directed    ondansetron (ZOFRAN) 4 MG tablet Take 1 tablet (4 mg total) by mouth every 6 (six) hours.    pantoprazole (PROTONIX) 40 MG tablet TAKE 1 TABLET BY MOUTH AT BEDTIME    polyethylene glycol (MIRALAX / GLYCOLAX) packet Take 17 g by mouth daily as needed for mild constipation.    potassium chloride (MICRO-K) 10 MEQ CR capsule TAKE 5 CAPSULES (50 MEQ TOTAL) BY MOUTH 2 (TWO) TIMES DAILY.    predniSONE (DELTASONE) 10 MG tablet Take 4 tablets (40 mg total) by mouth daily for 3 days, THEN 3 tablets (30 mg total) daily for 3 days, THEN 2 tablets (20 mg  total) daily for 3 days, THEN 1 tablet (10 mg total) daily for 3 days.    propranolol (INDERAL) 10 MG tablet TAKE 1 TABLET BY MOUTH 2 TIMES DAILY 09/12/2020: Patient states she only takes 1 per day at hs.    revefenacin (YUPELRI) 175 MCG/3ML nebulizer solution Take 3 mLs (175 mcg total) by nebulization daily.    rOPINIRole (REQUIP) 4 MG tablet TAKE 1 TABLET BY MOUTH TWICE DAILY    spironolactone (ALDACTONE) 25 MG tablet TAKE 1 TABLET BY MOUTH DAILY.    traMADol (ULTRAM) 50 MG tablet Take 1 tablet (50 mg total) by mouth 3 (three) times daily as needed.    UNABLE TO FIND CPAP- At bedtime    No facility-administered encounter medications on file as of 09/25/2020.    SHAZIA MITCHENER was contacted to remind her of her upcoming telephone visit with Debbora Dus on 10/02/2020  at 1:00pm . Patient was reminded to have all medications, supplements and any blood glucose and blood pressure readings available for review at appointment.   Are you having any problems with your medications? No the patient reports she uses 2 pharmacies [facility] [place] and she gets 1 mail order for her nebulizer solutions and supplies  Do you have any concerns you like to discuss with the pharmacist?  The patient reports she did just get out of the hospital from pneumonia and there was a discussion that she was not taking medications correctly.She will discuss     Star Rating Drugs: Medication:  Last Fill: Day Supply [place]  09/10/77  20 Trulicity  09/07/80  [place], CPP notified  [name], [facility] Assistant [phone]  I have reviewed the care management and care coordination activities outlined in this encounter and I am certifying that I agree with the content of this note. No further action required.  Debbora Dus, PharmD Clinical Pharmacist [facility] at [facility] [phone]

## 2020-09-26 ENCOUNTER — Other Ambulatory Visit: Payer: Self-pay

## 2020-09-26 ENCOUNTER — Ambulatory Visit (INDEPENDENT_AMBULATORY_CARE_PROVIDER_SITE_OTHER): Payer: Medicare HMO | Admitting: Family Medicine

## 2020-09-26 ENCOUNTER — Encounter: Payer: Self-pay | Admitting: Family Medicine

## 2020-09-26 VITALS — BP 122/68 | HR 93 | Temp 97.1°F | Ht 65.0 in | Wt 272.0 lb

## 2020-09-26 DIAGNOSIS — I5032 Chronic diastolic (congestive) heart failure: Secondary | ICD-10-CM | POA: Diagnosis not present

## 2020-09-26 DIAGNOSIS — R0602 Shortness of breath: Secondary | ICD-10-CM | POA: Diagnosis not present

## 2020-09-26 DIAGNOSIS — J189 Pneumonia, unspecified organism: Secondary | ICD-10-CM | POA: Diagnosis not present

## 2020-09-26 DIAGNOSIS — E1165 Type 2 diabetes mellitus with hyperglycemia: Secondary | ICD-10-CM

## 2020-09-26 DIAGNOSIS — R21 Rash and other nonspecific skin eruption: Secondary | ICD-10-CM

## 2020-09-26 MED ORDER — NYSTATIN 100000 UNIT/GM EX POWD: 1.0000 "application " | Freq: Three times a day (TID) | CUTANEOUS | 1 refills | Status: DC

## 2020-09-26 MED ORDER — KETOCONAZOLE 2 % EX CREA: 1.0000 "application " | TOPICAL_CREAM | Freq: Two times a day (BID) | CUTANEOUS | 1 refills | Status: DC

## 2020-09-26 MED ORDER — DOXYCYCLINE HYCLATE 100 MG PO TABS: 100.0000 mg | ORAL_TABLET | Freq: Two times a day (BID) | ORAL | 0 refills | Status: DC

## 2020-09-26 NOTE — Patient Instructions (Addendum)
Go to the lab on the way out.   If you have mychart we'll likely use that to update you.     If you need help getting set up with cardiology then let me know.  Start doxycycline in the meantime.    You may be able to taper your insulin as you come down on prednisone.  If your AM sugar is below 120, then cut back 1 unit on the next dose.    Try taking metolazone again tomorrow if needed.    Use nystatin powder, not ketoconazole cream.  Update me as needed.    Take care.  Glad to see you.

## 2020-09-26 NOTE — Progress Notes (Signed)
This visit occurred during the SARS-CoV-2 public health emergency.  Safety protocols were in place, including screening questions prior to the visit, additional usage of staff PPE, and extensive cleaning of exam room while observing appropriate contact time as indicated for disinfecting solutions.   PCP: [name], MD   Admit date: 09/19/2020 Discharge date: 09/22/2020   Admitted From: Home Disposition: Home   Recommendations for Outpatient Follow-up:  Follow up with PCP in 1-2 weeks Please obtain BMP/CBC in one week Please follow up with pulmonology and cardiology as scheduled   Home Health: None Equipment/Devices: None   Discharge Condition: Stable CODE STATUS: Full Diet recommendation: Diabetic diet   Brief/Interim Summary: 65 year old female with past medical history of COPD, asthma, diastolic congestive heart failure (Echo 05/2019 EF 70% with G1DD), OSA on CPAP, hypertension, obesity, insulin-dependent diabetes mellitus type 2, hyperlipidemia, gout, gastroesophageal reflux disease who presents to [facility] emergency department with complaints of shortness of breath and cough -worsening over the previous 5 days without clear provoking incident.  She also complains of right-sided pleuritic chest pain with deep inspiration and cough.  Was seen in the outpatient setting on the 13th given oral prednisone but despite this and increased bronchodilators at home she continued to worsen with increasing dyspnea at rest and exertion.  In the ED patient found to be hypoxic requiring supplemental oxygen to maintain sats even at rest, CTA at intake was unremarkable for embolism but did show multifocal pneumonia.  Patient admitted for pneumonia with likely concurrent COPD/asthma exacerbation.   Patient admitted as above with acute respiratory failure, initially hypoxic from baseline likely secondary to commune acquired pneumonia, cannot rule out concurrent COPD/asthma exacerbation.   Patient improved quickly over the last 48 hours, hypoxia resolved, patient remains somewhat dyspneic with exertion, she is due to follow-up with both pulmonology and cardiology in the next few weeks.  At this time given resolution of hypoxia with supportive care including steroids and antibiotics will discharge patient on remainder of antibiotic course as well as steroid taper until she can follow-up with pulmonology later this week.  We discussed some of her dyspnea may be related to general debility given her previous issues with bilateral lower extremity pain and limited mobility she is likely become somewhat deconditioned as well.  Recommended increased activity as tolerated and close follow-up as outlined above.   Discharge Diagnoses:  Principal Problem:   Pneumonia of both lower lobes due to infectious organism Active Problems:   Essential hypertension   GERD without esophagitis   OSA on CPAP   Chronic diastolic heart failure (HCC)   Type 2 diabetes mellitus with hyperglycemia, without long-term current use of insulin (HCC)   Gout   Acute exacerbation of COPD with asthma ([place])   Mixed diabetic hyperlipidemia associated with type 2 diabetes mellitus (HCC)   COPD exacerbation (HCC)   ===================================== Discussed with patient about recent events.  She had a visit but her respiratory symptoms got worse and she went to the emergency room, was admitted with multifocal pneumonia.  She has history of diabetes CHF and sleep apnea. In discussion with patient, she had an episode with talking while eating, and possibly aspirated prior to admission.  No sedation, no vomiting otherwise.  After that event her cough got worse.    She was treated supportively as an inpatient with antibiotics.  Imaging discussed with patient and reviewed CT scan with the patient at office visit today.  She has improved but she still has some  yellow sputum.    Diabetes discussed with patient Sugar ~100  recently, as of today.  Prev higher.  She is currently on prednisone.  She is going to restart Trulicity and may end up needing to taper her insulin when she restarts that and taper of prednisone.  Discussed.  See after visit summary.  Off abx currently.  Still with some SOB.  Took metolazone today, with lasix. D/w pt about taking metolazone again tomorrow.  She had not taken metolazone since prior to admission otherwise.  She still on Lasix at baseline.  Meds, vitals, and allergies reviewed.   ROS: Per HPI unless specifically indicated in ROS section   GEN: nad, alert and oriented HEENT: ncat NECK: supple w/o LA CV: rrr.   PULM: ctab except for scant soft rhonchi on the right side.  No inc wob, no focal decrease in breath sounds ABD: soft, +bs EXT: no edema SKIN: no acute rash but she does have chronic irritation inferior to both breasts consistent with a superficial fungal infection.  Discussed options.

## 2020-09-27 ENCOUNTER — Telehealth: Payer: Self-pay

## 2020-09-27 ENCOUNTER — Other Ambulatory Visit: Payer: Self-pay | Admitting: Family Medicine

## 2020-09-27 DIAGNOSIS — J189 Pneumonia, unspecified organism: Secondary | ICD-10-CM

## 2020-09-27 DIAGNOSIS — R21 Rash and other nonspecific skin eruption: Secondary | ICD-10-CM | POA: Insufficient documentation

## 2020-09-27 LAB — CBC WITH DIFFERENTIAL/PLATELET
Basophils Absolute: 0.1 10*3/uL (ref 0.0–0.1)
Basophils Relative: 0.4 % (ref 0.0–3.0)
Eosinophils Absolute: 0 10*3/uL (ref 0.0–0.7)
Eosinophils Relative: 0.2 % (ref 0.0–5.0)
HCT: 41.2 % (ref 36.0–46.0)
Hemoglobin: 13.4 g/dL (ref 12.0–15.0)
Lymphocytes Relative: 13.5 % (ref 12.0–46.0)
Lymphs Abs: 2.6 10*3/uL (ref 0.7–4.0)
MCHC: 32.5 g/dL (ref 30.0–36.0)
MCV: 93.3 fl (ref 78.0–100.0)
Monocytes Absolute: 0.9 10*3/uL (ref 0.1–1.0)
Monocytes Relative: 4.7 % (ref 3.0–12.0)
Neutro Abs: 15.4 10*3/uL — ABNORMAL HIGH (ref 1.4–7.7)
Neutrophils Relative %: 81.2 % — ABNORMAL HIGH (ref 43.0–77.0)
Platelets: 295 10*3/uL (ref 150.0–400.0)
RBC: 4.42 Mil/uL (ref 3.87–5.11)
RDW: 15.7 % — ABNORMAL HIGH (ref 11.5–15.5)
WBC: 19 10*3/uL (ref 4.0–10.5)

## 2020-09-27 LAB — BASIC METABOLIC PANEL
BUN: 31 mg/dL — ABNORMAL HIGH (ref 6–23)
CO2: 32 mEq/L (ref 19–32)
Calcium: 9.5 mg/dL (ref 8.4–10.5)
Chloride: 93 mEq/L — ABNORMAL LOW (ref 96–112)
Creatinine, Ser: 1.48 mg/dL — ABNORMAL HIGH (ref 0.40–1.20)
GFR: 36.88 mL/min — ABNORMAL LOW (ref >60.00)
Glucose, Bld: 378 mg/dL — ABNORMAL HIGH (ref 70–99)
Potassium: 4.6 mEq/L (ref 3.5–5.1)
Sodium: 136 mEq/L (ref 135–145)

## 2020-09-27 LAB — BRAIN NATRIURETIC PEPTIDE: Pro B Natriuretic peptide (BNP): 43 pg/mL (ref 0.0–100.0)

## 2020-09-27 NOTE — Telephone Encounter (Signed)
I will address this.  See OV note and result note.  Thanks.

## 2020-09-27 NOTE — Assessment & Plan Note (Addendum)
Improved but not back to baseline and still with some shortness of breath.  Still okay for outpatient follow-up.  Would continue prednisone taper.  Recheck labs today.  Start doxycycline with routine cautions.  We can recheck chest x-ray in mid August, rationale discussed with patient.  She is going to follow-up with pulmonary.

## 2020-09-27 NOTE — Telephone Encounter (Signed)
[facility] lab called critical results @ 1044  WBC 19

## 2020-09-27 NOTE — Assessment & Plan Note (Signed)
Can use topical nystatin powder and update me as needed.  She agrees to plan.

## 2020-09-27 NOTE — Assessment & Plan Note (Signed)
Restart Trulicity, she is going to taper prednisone and may need to taper insulin by 1 unit/day based on her a.m. sugars.  See after visit summary.  If sugar is below 120, she will cut 1 unit from her next dose.

## 2020-09-27 NOTE — Assessment & Plan Note (Signed)
I think it makes sense to take 1 more dose of metolazone tomorrow preemptively.  See notes on labs.  She is going to follow-up with cardiology.  Still okay for outpatient follow-up.  Continue Lasix as is for now.

## 2020-10-01 ENCOUNTER — Ambulatory Visit: Payer: Medicare HMO | Admitting: Podiatry

## 2020-10-02 ENCOUNTER — Telehealth: Payer: Self-pay

## 2020-10-02 ENCOUNTER — Telehealth: Payer: Medicare HMO

## 2020-10-02 NOTE — Telephone Encounter (Signed)
Contacted patient for CCM visit, however, she is still recovering from recent pneumonia and I would prefer to defer med-management until she is feeling better. She is improving on doxycyline and prednisone. Her BG are running a little bit high as expected with prednisone. Fasting BG 190-200s. He has resumed Trulicity 3 mg and taking 88 units [place] daily. No hypoglycemia.   Will r/s follow up for 1 month.  Debbora Dus, PharmD Clinical Pharmacist [facility] at [facility] [phone]

## 2020-10-09 ENCOUNTER — Other Ambulatory Visit: Payer: Self-pay | Admitting: Family Medicine

## 2020-10-09 DIAGNOSIS — J189 Pneumonia, unspecified organism: Secondary | ICD-10-CM

## 2020-10-09 NOTE — Progress Notes (Signed)
Please call patient.  Needs a follow-up chest x-ray in the middle of the month.  I put in an order to be done in [facility].  Please contact patient about this.  I will await the x-ray report.  Thanks.

## 2020-10-11 ENCOUNTER — Other Ambulatory Visit: Payer: Self-pay

## 2020-10-11 ENCOUNTER — Ambulatory Visit: Payer: Medicare HMO | Admitting: Adult Health

## 2020-10-11 ENCOUNTER — Encounter: Payer: Self-pay | Admitting: Adult Health

## 2020-10-11 DIAGNOSIS — J189 Pneumonia, unspecified organism: Secondary | ICD-10-CM

## 2020-10-11 DIAGNOSIS — M25561 Pain in right knee: Secondary | ICD-10-CM

## 2020-10-11 DIAGNOSIS — J441 Chronic obstructive pulmonary disease with (acute) exacerbation: Secondary | ICD-10-CM

## 2020-10-11 DIAGNOSIS — Z9989 Dependence on other enabling machines and devices: Secondary | ICD-10-CM

## 2020-10-11 DIAGNOSIS — M25562 Pain in left knee: Secondary | ICD-10-CM

## 2020-10-11 DIAGNOSIS — G8929 Other chronic pain: Secondary | ICD-10-CM

## 2020-10-11 DIAGNOSIS — G4733 Obstructive sleep apnea (adult) (pediatric): Secondary | ICD-10-CM

## 2020-10-11 NOTE — Progress Notes (Signed)
@Patient  ID: Kathleen Hatfield, female    DOB: 12/17/1955, 65 y.o.   MRN: [id number]  Chief Complaint  Patient presents with   Follow-up    Referring provider: [name], MD  HPI: 65 year old female followed for COPD with asthma and obstructive sleep apnea on CPAP Medical history significant for diabetes, diastolic heart failure, ulcerative colitis   TEST/EVENTS :  PFT 07/02/15 >> FEV1 1.50 (56%), FEV1% 69, TLC 4.99 (96%), DLCO 60% PFT 07/24/19 >> FEV1 1.09 (42%), FEV1% 67, TLC 4.85 (93%), DLCO 87%, +BD   Chest Imaging:  V/Q 04/05/16 >> normal   Sleep Tests:  PSG 09/01/12 >> AHI 36.1 ONO with CPAP 03/14/17 >> test time 10 hrs 34 min.  Basal SpO2 93%, low SpO2 88%. CPAP 04/17/20 to 05/16/20 >> used on 28 of 30 nights with average 5 hrs 23 min.  Average AHI 0.3 with CPAP 13 cm H2O   Cardiac Tests:  Echo 05/18/19 >> EF 70 %, mild LVH, grade 1 DD, aortic root 39 mm   10/11/2020 Follow up: COPD with asthma, obstructive sleep apnea, post hospital follow-up Patient returns for a follow-up visit.  She has underlying COPD with asthma and obstructive sleep apnea Patient was hospitalized last month for acute respiratory failure secondary to pneumonia and COPD exacerbation.  She was treated with antibiotics, steroids and nebulized bronchodilators.Chest x-ray showed bilateral airspace opacities left greater than right.  CT chest was negative for PE.  And confirm patchy airspace opacities bilaterally.  Suggestive of a multifocal pneumonia Since discharge patient is feeling better, remains weak. Has decreased cough and congestion .  Seen by PCP after discharge, has upcoming chest xray later this month .  Prior to admission sedentary lifestyle due to chronic knee pain and DJD . Unable to exercise.   She has underlying COPD is on budesonide twice daily on Yupelri nebulizer daily.  She also has albuterol inhaler and nebulizer if needed.  Patient has underlying obstructive sleep apnea.  She is on  nocturnal CPAP.  Patient says she wears her CPAP each night.  Usually gets in about 6 hours.  CPAP download shows good compliance with average usage at 5.5 hours.  AHI 0.3.  Patient is on CPAP 13 cm H2O.  Allergies  Allergen Reactions   Almond Oil Anaphylaxis, Shortness Of Breath and Swelling   Morphine And Related Shortness Of Breath and Other (See Comments)    Pt. States while in the hospital it affected her breathing, O2 dropped to the 70's   Atorvastatin Other (See Comments)    Leg weakness   Ceclor [Cefaclor] Nausea And Vomiting   Sulfa Antibiotics Nausea And Vomiting   Ciprofloxacin Other (See Comments)    Makes joints and muscles ache   Levaquin [Levofloxacin] Other (See Comments)    Body aches   Losartan Other (See Comments)    Weakness    Statins Other (See Comments)    Leg and body weakness    Immunization History  Administered Date(s) Administered   Influenza Split 12/01/2012, 01/18/2017   Influenza Whole 12/25/2011   Influenza,inj,Quad PF,6+ Mos 11/27/2013, 12/08/2014, 12/11/2015   Influenza-Unspecified 12/07/2017   Janssen (J&J) SARS-COV-2 Vaccination 06/15/2019   Pneumococcal Conjugate-13 02/17/2017   Pneumococcal Polysaccharide-23 11/27/2013    Past Medical History:  Diagnosis Date   Anemia    Arthritis    Asthma    Chronic diastolic CHF 73/4287   Echocardiogram 05/2019: EF 70, no RWMA, mild LVH, Gr 1 DD, normal RVSF, severe LVH, borderline asc  Aorta (39 mm)   CKD (chronic kidney disease)    Depression    Diabetes mellitus without complication (HCC)    Diverticulitis    Dyspnea    with exertion   GERD (gastroesophageal reflux disease)    History of blood transfusion    Hyperlipidemia    cannot tolerate statins   Hypertension    patient states she has never had high blood pressure.    Nuclear stress test    Myoview 05/2019: EF 83, no ischemia or infarction; Low Risk   Peripheral neuropathy    Pneumonia    PONV (postoperative nausea and vomiting)     RLS (restless legs syndrome)    Sleep apnea    uses CPAP   Ulcerative colitis ([place])    dr Collene Mares    Tobacco History: Social History   Tobacco Use  Smoking Status Never  Smokeless Tobacco Never   Counseling given: Not Answered   Outpatient Medications Prior to Visit  Medication Sig Dispense Refill   acetaminophen (TYLENOL) 500 MG tablet Take 500 mg by mouth every 6 (six) hours as needed for moderate pain.     albuterol (VENTOLIN HFA) 108 (90 Base) MCG/ACT inhaler Inhale 2 puffs into the lungs every 6 (six) hours as needed. 17 g 2   allopurinol (ZYLOPRIM) 100 MG tablet Take 0.5 tablets (50 mg total) by mouth daily. 45 tablet 0   BAYER CONTOUR NEXT TEST test strip 1 each by Other route daily. As directed     budesonide (PULMICORT) 0.5 MG/2ML nebulizer solution Take 2 mLs (0.5 mg total) by nebulization 2 (two) times daily. 360 mL 3   buPROPion (WELLBUTRIN XL) 300 MG 24 hr tablet TAKE 1 TABLET BY MOUTH DAILY 90 tablet 2   Cholecalciferol (VITAMIN D-3) 5000 UNITS TABS Take 5,000 Units by mouth every other day.      colchicine 0.6 MG tablet Take 1 tablet (0.6 mg total) by mouth daily as needed (for gout). 30 tablet 1   dicyclomine (BENTYL) 20 MG tablet TAKE 1 TABLET UP TO FOUR TIMES A DAY FOR GI CRAMPING, PAIN, AND NAUSEA/VOMITING AS NEEDED 60 tablet 1   doxycycline (VIBRA-TABS) 100 MG tablet Take 1 tablet (100 mg total) by mouth 2 (two) times daily. 14 tablet 0   Dulaglutide (TRULICITY) 3 PJ/8.2NK SOPN Inject 3 mg as directed once a week. 2 mL 5   DULoxetine (CYMBALTA) 60 MG capsule TAKE 1 CAPSULE BY MOUTH ONCE DAILY. 90 capsule 0   furosemide (LASIX) 20 MG tablet Take 3 tablets (60 mg total) by mouth 2 (two) times daily. 180 tablet 11   gabapentin (NEURONTIN) 100 MG capsule TAKE 3 CAPSULES BY MOUTH AT BEDTIME. 270 capsule 1   GLOBAL EASE INJECT PEN NEEDLES 32G X 4 MM MISC USE AS DIRECTED WITH VICTOZA AND TRESIBA. 100 each 4   guaiFENesin-dextromethorphan (ROBITUSSIN DM) 100-10 MG/5ML syrup  Take 5 mLs by mouth every 4 (four) hours as needed for cough. 118 mL 0   insulin degludec (TRESIBA FLEXTOUCH) 200 UNIT/ML FlexTouch Pen INJECT 88 UNITS UNDER SKIN ONCE DAILY 9 mL 1   ipratropium-albuterol (DUONEB) 0.5-2.5 (3) MG/3ML SOLN Take 3 mLs by nebulization every 6 (six) hours as needed (wheezing).     meloxicam (MOBIC) 15 MG tablet TAKE 1 TABLET (15 MG TOTAL) BY MOUTH DAILY. 30 tablet 1   mesalamine (LIALDA) 1.2 g EC tablet Take 1.2 g by mouth 2 (two) times daily.     metolazone (ZAROXOLYN) 5 MG tablet TAKE 1 TABLET  BY MOUTH AS NEEDED. 30 tablet 1   MICROLET LANCETS MISC 1 each by Other route. As directed     nystatin (MYCOSTATIN/NYSTOP) powder Apply 1 application topically 3 (three) times daily. 60 g 1   ondansetron (ZOFRAN) 4 MG tablet Take 1 tablet (4 mg total) by mouth every 6 (six) hours. 12 tablet 0   pantoprazole (PROTONIX) 40 MG tablet TAKE 1 TABLET BY MOUTH AT BEDTIME 90 tablet 1   polyethylene glycol (MIRALAX / GLYCOLAX) packet Take 17 g by mouth daily as needed for mild constipation.     potassium chloride (MICRO-K) 10 MEQ CR capsule TAKE 5 CAPSULES (50 MEQ TOTAL) BY MOUTH 2 (TWO) TIMES DAILY. 240 capsule 2   propranolol (INDERAL) 10 MG tablet TAKE 1 TABLET BY MOUTH 2 TIMES DAILY 180 tablet 0   revefenacin (YUPELRI) 175 MCG/3ML nebulizer solution Take 3 mLs (175 mcg total) by nebulization daily. 90 mL 3   rOPINIRole (REQUIP) 4 MG tablet TAKE 1 TABLET BY MOUTH TWICE DAILY 180 tablet 0   spironolactone (ALDACTONE) 25 MG tablet TAKE 1 TABLET BY MOUTH DAILY. 30 tablet 3   traMADol (ULTRAM) 50 MG tablet Take 1 tablet (50 mg total) by mouth 3 (three) times daily as needed. 30 tablet 1   UNABLE TO FIND CPAP- At bedtime     No facility-administered medications prior to visit.     Review of Systems:   Constitutional:   No  weight loss, night sweats,  Fevers, chills,  +fatigue, or  lassitude.  HEENT:   No headaches,  Difficulty swallowing,  Tooth/dental problems, or  Sore throat,                 No sneezing, itching, ear ache, nasal congestion, post nasal drip,   CV:  No chest pain,  Orthopnea, PND, swelling in lower extremities, anasarca, dizziness, palpitations, syncope.   GI  No heartburn, indigestion, abdominal pain, nausea, vomiting, diarrhea, change in bowel habits, loss of appetite, bloody stools.   Resp: No shortness of breath with exertion or at rest.  No excess mucus, no productive cough,  No non-productive cough,  No coughing up of blood.  No change in color of mucus.  No wheezing.  No chest wall deformity  Skin: no rash or lesions.  GU: no dysuria, change in color of urine, no urgency or frequency.  No flank pain, no hematuria   MS:  No joint pain or swelling.  No decreased range of motion.  + knee pain     Physical Exam  BP 120/78 (BP Location: Left Arm, Patient Position: Sitting, Cuff Size: Normal)   Pulse 85   Temp 98.2 F (36.8 C) (Oral)   Ht 5' 5"  (1.651 m)   Wt 268 lb 3.2 oz (121.7 kg)   SpO2 96%   BMI 44.63 kg/m   GEN: A/Ox3; pleasant , NAD, well nourished , wheelchair    HEENT:  [place]/AT,  EACs-clear, TMs-wnl, NOSE-clear, THROAT-clear, no lesions, no postnasal drip or exudate noted.   NECK:  Supple w/ fair ROM; no JVD; normal carotid impulses w/o bruits; no thyromegaly or nodules palpated; no lymphadenopathy.    RESP  Clear  P & A; w/o, wheezes/ rales/ or rhonchi. no accessory muscle use, no dullness to percussion  CARD:  RRR, no m/r/g, tr  peripheral edema, pulses intact, no cyanosis or clubbing.  GI:   Soft & nt; nml bowel sounds; no organomegaly or masses detected.   Musco: Warm bil, no deformities or joint  swelling noted.   Neuro: alert, no focal deficits noted.    Skin: Warm, no lesions or rashes    Lab Results:  CBC    Component Value Date/Time   WBC 19.0 Repeated and verified X2. (HH) 09/26/2020 1518   RBC 4.42 09/26/2020 1518   HGB 13.4 09/26/2020 1518   HGB 13.2 09/04/2019 0000   HGB 13.5 06/09/2019 1250    HCT 41.2 09/26/2020 1518   HCT 40.2 06/09/2019 1250   PLT 295.0 09/26/2020 1518   PLT 317 09/04/2019 0000   MCV 93.3 09/26/2020 1518   MCV 83 06/09/2019 1250   MCH 30.0 09/22/2020 0340   MCHC 32.5 09/26/2020 1518   RDW 15.7 (H) 09/26/2020 1518   RDW 15.1 06/09/2019 1250   LYMPHSABS 2.6 09/26/2020 1518   MONOABS 0.9 09/26/2020 1518   EOSABS 0.0 09/26/2020 1518   BASOSABS 0.1 09/26/2020 1518    BMET    Component Value Date/Time   NA 136 09/26/2020 1518   NA 138 05/30/2020 1547   NA 137 09/04/2019 0000   K 4.6 09/26/2020 1518   K 3.6 09/04/2019 0000   CL 93 (L) 09/26/2020 1518   CL 89 09/04/2019 0000   CO2 32 09/26/2020 1518   CO2 38 09/04/2019 0000   GLUCOSE 378 (H) 09/26/2020 1518   BUN 31 (H) 09/26/2020 1518   BUN 36 (H) 05/30/2020 1547   CREATININE 1.48 (H) 09/26/2020 1518   CREATININE 1.16 09/04/2019 0000   CALCIUM 9.5 09/26/2020 1518   CALCIUM 10.1 09/04/2019 0000   GFRNONAA 43 (L) 09/22/2020 0340   GFRAA 69 01/19/2020 1356    BNP    Component Value Date/Time   BNP 79.8 09/19/2020 1529   BNP 16.6 06/13/2012 1211    ProBNP    Component Value Date/Time   PROBNP 43.0 09/26/2020 1518    Imaging: CT Angio Chest PE W and/or Wo Contrast  Result Date: 09/19/2020 CLINICAL DATA:  Shortness of breath and cough with positive D-dimer, initial encounter EXAM: CT ANGIOGRAPHY CHEST WITH CONTRAST TECHNIQUE: Multidetector CT imaging of the chest was performed using the standard protocol during bolus administration of intravenous contrast. Multiplanar CT image reconstructions and MIPs were obtained to evaluate the vascular anatomy. CONTRAST:  160m OMNIPAQUE IOHEXOL 350 MG/ML SOLN COMPARISON:  Chest x-ray from earlier in the same day. FINDINGS: Cardiovascular: Thoracic aorta and its branches demonstrate atherosclerotic calcification without aneurysmal dilatation. No evidence of dissection is seen. No cardiac enlargement is noted. The pulmonary artery is well visualized  bilaterally within normal branching pattern. No definitive intraluminal filling defect to suggest pulmonary embolism is seen. Mediastinum/Nodes: Thoracic inlet is within normal limits. No sizable hilar or mediastinal adenopathy is noted. The esophagus as visualized is within normal limits. Lungs/Pleura: Lungs are well aerated bilaterally. Patchy airspace opacity is noted in the lingula and left lower lobe as well as in the right upper lobe along the minor fissure anteriorly in the right upper lobe. Right upper lobe changes suggests the possibility of pulmonary infarct although no discrete sizable embolus is noted. Mild emphysematous changes are noted. No sizable effusion is seen. No sizable parenchymal nodule is noted. Upper Abdomen: Visualized upper abdomen shows fatty infiltration of the liver. No other focal abnormality is noted. Musculoskeletal: Degenerative changes of the thoracic spine are seen. No acute bony abnormality is noted. Review of the MIP images confirms the above findings. IMPRESSION: No definitive pulmonary emboli are seen. Patchy airspace opacity is noted bilaterally likely representing multifocal infiltrate. The right upper  lobe changes mimic a pulmonary infarct although no definitive embolus is seen. Fatty liver. Aortic Atherosclerosis (ICD10-I70.0) and Emphysema (ICD10-J43.9). Electronically Signed   By: Inez Catalina M.D.   On: 09/19/2020 19:28   DG Chest Port 1 View  Result Date: 09/19/2020 CLINICAL DATA:  Shortness of breath.  Hypoxia. EXAM: PORTABLE CHEST 1 VIEW COMPARISON:  01/08/2020 FINDINGS: Focal airspace consolidation noted peripheral right mid lung along the minor fissure laterally. There is also new left base collapse/consolidative opacity. No evidence for pleural effusion. The cardio pericardial silhouette is enlarged. The visualized bony structures of the thorax show no acute abnormality. IMPRESSION: New bilateral airspace disease, left greater than right suggesting multifocal  pneumonia. Imaging follow-up recommended to ensure resolution. Electronically Signed   By: Misty Stanley M.D.   On: 09/19/2020 17:07    betamethasone acetate-betamethasone sodium phosphate (CELESTONE) injection 3 mg     Date Action Dose Route User   Discharged on 09/22/2020   Admitted on 09/19/2020   Discharged on 09/19/2020   Admitted on 09/19/2020   08/20/2020 1456 Given 3 mg Intra-articular Edrick Kins, DPM       PFT Results Latest Ref Rng & Units 07/24/2019 07/02/2015 07/12/2012  FVC-Pre L 1.53 1.85 -  FVC-Predicted Pre % 46 53 57  FVC-Post L 1.62 2.18 2.20  FVC-Predicted Post % 48 63 65  Pre FEV1/FVC % % 59 65 60  Post FEV1/FCV % % 67 69 66  FEV1-Pre L 0.91 1.20 1.18  FEV1-Predicted Pre % 35 45 45  FEV1-Post L 1.09 1.50 1.44  DLCO uncorrected ml/min/mmHg 17.94 15.55 20.96  DLCO UNC% % 87 60 86  DLCO corrected ml/min/mmHg 18.17 17.09 -  DLCO COR %Predicted % 88 66 -  DLVA Predicted % 149 99 114  TLC L 4.85 4.99 4.09  TLC % Predicted % 93 96 81  RV % Predicted % 153 133 136    No results found for: NITRICOXIDE      Assessment & Plan:   Pneumonia of both lower lobes due to infectious organism Recent hospitalization with bilateral pneumonia.  Patient is clinically improved.  Will have follow-up chest x-ray as planned later this month with her primary care provider.  Continued advance activity as tolerated.  Plan  Patient Instructions  Continue on Pulmicort nebulizer twice daily Continue on Yupelri nebulizer daily Albuterol inhaler/nebulizer as needed Continue on CPAP at bedtime. Do not drive if sleepy Work on healthy weight Healthy weight and wellness - call back if would like referral .  Follow-up with Dr. Halford Chessman and 2 months on as needed Please contact office for sooner follow up if symptoms do not improve or worsen or seek emergency care       OSA on CPAP Excellent control and compliance on CPAP.  Continue on current settings   Plan  . Patient  Instructions  Continue on Pulmicort nebulizer twice daily Continue on Yupelri nebulizer daily Albuterol inhaler/nebulizer as needed Continue on CPAP at bedtime. Do not drive if sleepy Work on healthy weight Healthy weight and wellness - call back if would like referral .  Follow-up with Dr. Halford Chessman and 2 months on as needed Please contact office for sooner follow up if symptoms do not improve or worsen or seek emergency care       Morbid obesity ([place]) Continue to work on healthy weight loss.  We discussed weight loss programs such as healthy weight and wellness.  Patient will call back if she changes her mind for referral.  COPD with acute exacerbation (HCC) Recent exacerbation with associated pneumonia requiring hospitalization.  Patient is clinically improved.  Have advised her to continue on her maintenance regimen  Plan Patient Instructions  Continue on Pulmicort nebulizer twice daily Continue on Yupelri nebulizer daily Albuterol inhaler/nebulizer as needed Continue on CPAP at bedtime. Do not drive if sleepy Work on healthy weight Healthy weight and wellness - call back if would like referral .  Follow-up with Dr. Halford Chessman and 2 months on as needed Please contact office for sooner follow up if symptoms do not improve or worsen or seek emergency care       Knee pain Bilateral knee pain with DJD.  Patient's encouraged on activity as tolerated.  Suggested water exercise such as walking at the pool.  Continue to follow with primary care and/or orthopedics.  I spent   41 minutes dedicated to the care of this patient on the date of this encounter to include pre-visit review of records, face-to-face time with the patient discussing conditions above, post visit ordering of testing, clinical documentation with the electronic health record, making appropriate referrals as documented, and communicating necessary findings to members of the patients care team.     Rexene Edison,  NP 10/11/2020

## 2020-10-11 NOTE — Assessment & Plan Note (Signed)
Recent hospitalization with bilateral pneumonia.  Patient is clinically improved.  Will have follow-up chest x-ray as planned later this month with her primary care provider.  Continued advance activity as tolerated.  Plan  Patient Instructions  Continue on Pulmicort nebulizer twice daily Continue on Yupelri nebulizer daily Albuterol inhaler/nebulizer as needed Continue on CPAP at bedtime. Do not drive if sleepy Work on healthy weight Healthy weight and wellness - call back if would like referral .  Follow-up with Dr. Halford Chessman and 2 months on as needed Please contact office for sooner follow up if symptoms do not improve or worsen or seek emergency care

## 2020-10-11 NOTE — Patient Instructions (Addendum)
Continue on Pulmicort nebulizer twice daily Continue on Yupelri nebulizer daily Albuterol inhaler/nebulizer as needed Continue on CPAP at bedtime. Do not drive if sleepy Work on healthy weight Healthy weight and wellness - call back if would like referral .  Follow-up with Dr. Halford Chessman and 2 months on as needed Please contact office for sooner follow up if symptoms do not improve or worsen or seek emergency care

## 2020-10-11 NOTE — Assessment & Plan Note (Signed)
Bilateral knee pain with DJD.  Patient's encouraged on activity as tolerated.  Suggested water exercise such as walking at the pool.  Continue to follow with primary care and/or orthopedics.

## 2020-10-11 NOTE — Assessment & Plan Note (Signed)
Recent exacerbation with associated pneumonia requiring hospitalization.  Patient is clinically improved.  Have advised her to continue on her maintenance regimen  Plan Patient Instructions  Continue on Pulmicort nebulizer twice daily Continue on Yupelri nebulizer daily Albuterol inhaler/nebulizer as needed Continue on CPAP at bedtime. Do not drive if sleepy Work on healthy weight Healthy weight and wellness - call back if would like referral .  Follow-up with Dr. Halford Chessman and 2 months on as needed Please contact office for sooner follow up if symptoms do not improve or worsen or seek emergency care

## 2020-10-11 NOTE — Assessment & Plan Note (Signed)
Continue to work on healthy weight loss.  We discussed weight loss programs such as healthy weight and wellness.  Patient will call back if she changes her mind for referral.

## 2020-10-11 NOTE — Assessment & Plan Note (Signed)
Excellent control and compliance on CPAP.  Continue on current settings   Plan  . Patient Instructions  Continue on Pulmicort nebulizer twice daily Continue on Yupelri nebulizer daily Albuterol inhaler/nebulizer as needed Continue on CPAP at bedtime. Do not drive if sleepy Work on healthy weight Healthy weight and wellness - call back if would like referral .  Follow-up with Dr. Halford Chessman and 2 months on as needed Please contact office for sooner follow up if symptoms do not improve or worsen or seek emergency care

## 2020-10-11 NOTE — Progress Notes (Signed)
Reviewed and agree with assessment/plan.   [name], MD [facility] Pulmonary/Critical Care 10/11/2020, 4:49 PM Pager:  [phone]

## 2020-10-11 NOTE — Progress Notes (Signed)
Patient notified xray order has been put in. Advised patient she does not need an appt for this and that she can walk in to [facility] to have done towards the middle of the month. Patient verbalized understanding.

## 2020-10-15 ENCOUNTER — Ambulatory Visit: Payer: Medicare HMO | Admitting: Podiatry

## 2020-10-15 ENCOUNTER — Other Ambulatory Visit: Payer: Self-pay

## 2020-10-15 DIAGNOSIS — M659 Synovitis and tenosynovitis, unspecified: Secondary | ICD-10-CM | POA: Diagnosis not present

## 2020-10-15 DIAGNOSIS — M25871 Other specified joint disorders, right ankle and foot: Secondary | ICD-10-CM | POA: Diagnosis not present

## 2020-10-15 NOTE — Progress Notes (Signed)
   Subjective:  65 y.o. female presenting today for follow-up evaluation of right foot and ankle pain is been going on for about 6 months now.  Patient was recently admitted into the hospital for double pneumonia.  She says that she received IV steroid which helped significantly throughout her entire body.  Overall there is much improvement to the right foot.  She presents for further treatment evaluation  Past Medical History:  Diagnosis Date   Anemia    Arthritis    Asthma    Chronic diastolic CHF 76/8088   Echocardiogram 05/2019: EF 70, no RWMA, mild LVH, Gr 1 DD, normal RVSF, severe LVH, borderline asc Aorta (39 mm)   CKD (chronic kidney disease)    Depression    Diabetes mellitus without complication (HCC)    Diverticulitis    Dyspnea    with exertion   GERD (gastroesophageal reflux disease)    History of blood transfusion    Hyperlipidemia    cannot tolerate statins   Hypertension    patient states she has never had high blood pressure.    Nuclear stress test    Myoview 05/2019: EF 83, no ischemia or infarction; Low Risk   Peripheral neuropathy    Pneumonia    PONV (postoperative nausea and vomiting)    RLS (restless legs syndrome)    Sleep apnea    uses CPAP   Ulcerative colitis ([place])    dr Collene Mares     Objective / Physical Exam:  General:  The patient is alert and oriented x3 in no acute distress. Dermatology:  Skin is warm, dry and supple bilateral lower extremities. Negative for open lesions or macerations. Vascular:  Palpable pedal pulses bilaterally. No edema or erythema noted. Capillary refill within normal limits. Neurological:  Epicritic and protective threshold grossly intact bilaterally.  Musculoskeletal Exam:  Minimal pain on palpation to the anterior lateral medial aspects of the patient's right ankle. Mild edema noted.  Minimal pain on palpation also noted to the tibial sesamoid right first MTPJ range of motion within normal limits to all pedal and ankle  joints bilateral. Muscle strength 5/5 in all groups bilateral.    Assessment: 1.  Capsulitis/synovitis right ankle 2.  Sesamoiditis right first MTPJ  Plan of Care:  1. Patient was evaluated. 2.  Overall the patient is doing much better 3.  Continue meloxicam 15 mg daily as well as allopurinol daily as per PCP 4.  Continue compression ankle sleeves daily 5.  Continue Dr. Gibson Ramp stability walking sneakers 6.  Return to clinic as needed  *Goes by Donia Ast, DPM [facility]  Dr. Edrick Kins, DPM    [address], [place] [address]                Office [phone]  Fax [phone]

## 2020-10-22 ENCOUNTER — Other Ambulatory Visit: Payer: Self-pay | Admitting: Podiatry

## 2020-10-25 ENCOUNTER — Ambulatory Visit
Admission: RE | Admit: 2020-10-25 | Discharge: 2020-10-25 | Disposition: A | Discharge status: Home / Self Care | Payer: Medicare HMO | Source: Ambulatory Visit | Attending: Family Medicine | Admitting: Family Medicine

## 2020-10-25 DIAGNOSIS — R0602 Shortness of breath: Secondary | ICD-10-CM | POA: Diagnosis not present

## 2020-10-25 DIAGNOSIS — R5383 Other fatigue: Secondary | ICD-10-CM | POA: Diagnosis not present

## 2020-10-25 DIAGNOSIS — J189 Pneumonia, unspecified organism: Secondary | ICD-10-CM

## 2020-10-27 ENCOUNTER — Ambulatory Visit
Admission: EM | Admit: 2020-10-27 | Discharge: 2020-10-27 | Disposition: A | Discharge status: Home / Self Care | Payer: Medicare HMO | Attending: Urgent Care | Admitting: Urgent Care

## 2020-10-27 DIAGNOSIS — N3001 Acute cystitis with hematuria: Secondary | ICD-10-CM | POA: Insufficient documentation

## 2020-10-27 DIAGNOSIS — N183 Chronic kidney disease, stage 3 unspecified: Secondary | ICD-10-CM | POA: Diagnosis not present

## 2020-10-27 DIAGNOSIS — R300 Dysuria: Secondary | ICD-10-CM

## 2020-10-27 DIAGNOSIS — E119 Type 2 diabetes mellitus without complications: Secondary | ICD-10-CM | POA: Insufficient documentation

## 2020-10-27 DIAGNOSIS — Z794 Long term (current) use of insulin: Secondary | ICD-10-CM | POA: Insufficient documentation

## 2020-10-27 LAB — POCT URINALYSIS DIP (MANUAL ENTRY)
Bilirubin, UA: NEGATIVE
Glucose, UA: NEGATIVE mg/dL
Ketones, POC UA: NEGATIVE mg/dL
Nitrite, UA: POSITIVE — AB
Protein Ur, POC: 30 mg/dL — AB
Spec Grav, UA: 1.025 (ref 1.010–1.025)
Urobilinogen, UA: 0.2 E.U./dL
pH, UA: 5.5 (ref 5.0–8.0)

## 2020-10-27 MED ORDER — CEPHALEXIN 500 MG PO CAPS: 500.0000 mg | ORAL_CAPSULE | Freq: Two times a day (BID) | ORAL | 0 refills | Status: DC

## 2020-10-27 NOTE — ED Provider Notes (Signed)
[facility]   MRN: [id number] DOB: [birth year]  Subjective:   Kathleen Hatfield is a 65 y.o. female presenting for 6 day history of dysuria, urinary frequency, cloudy urine, malodorous urine.  Patient has a history of UTIs.  Also has a history of type 2 diabetes treated with insulin, history of CKD.  Denies fever, nausea, vomiting, flank pain.  No current facility-administered medications for this encounter.  Current Outpatient Medications:    acetaminophen (TYLENOL) 500 MG tablet, Take 500 mg by mouth every 6 (six) hours as needed for moderate pain., Disp: , Rfl:    albuterol (VENTOLIN HFA) 108 (90 Base) MCG/ACT inhaler, Inhale 2 puffs into the lungs every 6 (six) hours as needed., Disp: 17 g, Rfl: 2   allopurinol (ZYLOPRIM) 100 MG tablet, Take 0.5 tablets (50 mg total) by mouth daily., Disp: 45 tablet, Rfl: 0   BAYER CONTOUR NEXT TEST test strip, 1 each by Other route daily. As directed, Disp: , Rfl:    budesonide (PULMICORT) 0.5 MG/2ML nebulizer solution, Take 2 mLs (0.5 mg total) by nebulization 2 (two) times daily., Disp: 360 mL, Rfl: 3   buPROPion (WELLBUTRIN XL) 300 MG 24 hr tablet, TAKE 1 TABLET BY MOUTH DAILY, Disp: 90 tablet, Rfl: 2   Cholecalciferol (VITAMIN D-3) 5000 UNITS TABS, Take 5,000 Units by mouth every other day. , Disp: , Rfl:    colchicine 0.6 MG tablet, Take 1 tablet (0.6 mg total) by mouth daily as needed (for gout)., Disp: 30 tablet, Rfl: 1   dicyclomine (BENTYL) 20 MG tablet, TAKE 1 TABLET UP TO FOUR TIMES A DAY FOR GI CRAMPING, PAIN, AND NAUSEA/VOMITING AS NEEDED, Disp: 60 tablet, Rfl: 1   doxycycline (VIBRA-TABS) 100 MG tablet, Take 1 tablet (100 mg total) by mouth 2 (two) times daily., Disp: 14 tablet, Rfl: 0   Dulaglutide (TRULICITY) 3 MI/6.8EH SOPN, Inject 3 mg as directed once a week., Disp: 2 mL, Rfl: 5   DULoxetine (CYMBALTA) 60 MG capsule, TAKE 1 CAPSULE BY MOUTH ONCE DAILY., Disp: 90 capsule, Rfl: 0   furosemide (LASIX) 20 MG tablet, Take 3 tablets  (60 mg total) by mouth 2 (two) times daily., Disp: 180 tablet, Rfl: 11   gabapentin (NEURONTIN) 100 MG capsule, TAKE 3 CAPSULES BY MOUTH AT BEDTIME., Disp: 270 capsule, Rfl: 1   GLOBAL EASE INJECT PEN NEEDLES 32G X 4 MM MISC, USE AS DIRECTED WITH VICTOZA AND TRESIBA., Disp: 100 each, Rfl: 4   guaiFENesin-dextromethorphan (ROBITUSSIN DM) 100-10 MG/5ML syrup, Take 5 mLs by mouth every 4 (four) hours as needed for cough., Disp: 118 mL, Rfl: 0   insulin degludec (TRESIBA FLEXTOUCH) 200 UNIT/ML FlexTouch Pen, INJECT 88 UNITS UNDER SKIN ONCE DAILY, Disp: 9 mL, Rfl: 1   ipratropium-albuterol (DUONEB) 0.5-2.5 (3) MG/3ML SOLN, Take 3 mLs by nebulization every 6 (six) hours as needed (wheezing)., Disp: , Rfl:    meloxicam (MOBIC) 15 MG tablet, TAKE 1 TABLET (15 MG TOTAL) BY MOUTH DAILY., Disp: 30 tablet, Rfl: 1   mesalamine (LIALDA) 1.2 g EC tablet, Take 1.2 g by mouth 2 (two) times daily., Disp: , Rfl:    metolazone (ZAROXOLYN) 5 MG tablet, TAKE 1 TABLET BY MOUTH AS NEEDED., Disp: 30 tablet, Rfl: 1   MICROLET LANCETS MISC, 1 each by Other route. As directed, Disp: , Rfl:    nystatin (MYCOSTATIN/NYSTOP) powder, Apply 1 application topically 3 (three) times daily., Disp: 60 g, Rfl: 1   ondansetron (ZOFRAN) 4 MG tablet, Take 1 tablet (4 mg total)  by mouth every 6 (six) hours., Disp: 12 tablet, Rfl: 0   pantoprazole (PROTONIX) 40 MG tablet, TAKE 1 TABLET BY MOUTH AT BEDTIME, Disp: 90 tablet, Rfl: 1   polyethylene glycol (MIRALAX / GLYCOLAX) packet, Take 17 g by mouth daily as needed for mild constipation., Disp: , Rfl:    potassium chloride (MICRO-K) 10 MEQ CR capsule, TAKE 5 CAPSULES (50 MEQ TOTAL) BY MOUTH 2 (TWO) TIMES DAILY., Disp: 240 capsule, Rfl: 2   propranolol (INDERAL) 10 MG tablet, TAKE 1 TABLET BY MOUTH 2 TIMES DAILY, Disp: 180 tablet, Rfl: 0   revefenacin (YUPELRI) 175 MCG/3ML nebulizer solution, Take 3 mLs (175 mcg total) by nebulization daily., Disp: 90 mL, Rfl: 3   rOPINIRole (REQUIP) 4 MG  tablet, TAKE 1 TABLET BY MOUTH TWICE DAILY, Disp: 180 tablet, Rfl: 0   spironolactone (ALDACTONE) 25 MG tablet, TAKE 1 TABLET BY MOUTH DAILY., Disp: 30 tablet, Rfl: 3   traMADol (ULTRAM) 50 MG tablet, Take 1 tablet (50 mg total) by mouth 3 (three) times daily as needed., Disp: 30 tablet, Rfl: 1   UNABLE TO FIND, CPAP- At bedtime, Disp: , Rfl:    Allergies  Allergen Reactions   Almond Oil Anaphylaxis, Shortness Of Breath and Swelling   Morphine And Related Shortness Of Breath and Other (See Comments)    Pt. States while in the hospital it affected her breathing, O2 dropped to the 70's   Atorvastatin Other (See Comments)    Leg weakness   Ceclor [Cefaclor] Nausea And Vomiting   Sulfa Antibiotics Nausea And Vomiting   Ciprofloxacin Other (See Comments)    Makes joints and muscles ache   Levaquin [Levofloxacin] Other (See Comments)    Body aches   Losartan Other (See Comments)    Weakness    Statins Other (See Comments)    Leg and body weakness    Past Medical History:  Diagnosis Date   Anemia    Arthritis    Asthma    Chronic diastolic CHF 92/4268   Echocardiogram 05/2019: EF 70, no RWMA, mild LVH, Gr 1 DD, normal RVSF, severe LVH, borderline asc Aorta (39 mm)   CKD (chronic kidney disease)    Depression    Diabetes mellitus without complication (HCC)    Diverticulitis    Dyspnea    with exertion   GERD (gastroesophageal reflux disease)    History of blood transfusion    Hyperlipidemia    cannot tolerate statins   Hypertension    patient states she has never had high blood pressure.    Nuclear stress test    Myoview 05/2019: EF 83, no ischemia or infarction; Low Risk   Peripheral neuropathy    Pneumonia    PONV (postoperative nausea and vomiting)    RLS (restless legs syndrome)    Sleep apnea    uses CPAP   Ulcerative colitis ([place])    dr Collene Mares     Past Surgical History:  Procedure Laterality Date   ABDOMINAL HYSTERECTOMY     APPENDECTOMY     BREAST BIOPSY      BREAST SURGERY     left biopsy   CARDIAC CATHETERIZATION     CESAREAN SECTION     CHOLECYSTECTOMY     HERNIA REPAIR     INSERTION OF MESH N/A 11/15/2015   Procedure: INSERTION OF MESH;  Surgeon: [name], MD;  Location: [facility];  Service: General;  Laterality: N/A;   LAPAROSCOPIC LYSIS OF ADHESIONS N/A 11/15/2015   Procedure: LAPAROSCOPIC  LYSIS OF ADHESIONS;  Surgeon: [name], MD;  Location: [facility];  Service: General;  Laterality: N/A;   RIGHT HEART CATHETERIZATION N/A 02/27/2013   Procedure: RIGHT HEART CATH;  Surgeon: Blane Ohara, MD;  Location: [facility] CATH LAB;  Service: Cardiovascular;  Laterality: N/A;   RIGHT HEART CATHETERIZATION N/A 12/14/2013   Procedure: RIGHT HEART CATH;  Surgeon: Larey Dresser, MD;  Location: [facility] CATH LAB;  Service: Cardiovascular;  Laterality: N/A;   SIGMOIDECTOMY  2010   diverticular disease   VENTRAL HERNIA REPAIR N/A 11/15/2015   Procedure: LAPAROSCOPIC VENTRAL WALL HERNIA REPAIR;  Surgeon: [name], MD;  Location: [facility];  Service: General;  Laterality: N/A;    Family History  Problem Relation Age of Onset   Heart disease Mother    Hypertension Mother    Dementia Mother    Heart disease Father    COPD Father    Hypertension Father    Heart disease Brother    Other Brother        knee replacement; hip replacement   Other Brother        cant brathe when laying down   Breast cancer Neg Hx    Colon cancer Neg Hx     Social History   Tobacco Use   Smoking status: Never   Smokeless tobacco: Never  Vaping Use   Vaping Use: Never used  Substance Use Topics   Alcohol use: No   Drug use: No    ROS   Objective:   Vitals: BP 136/79 (BP Location: Left Arm)   Pulse 82   Temp 98.2 F (36.8 C) (Oral)   Resp 18   SpO2 92%   Physical Exam Constitutional:      General: She is not in acute distress.    Appearance: Normal appearance. She is well-developed. She is obese. She is not ill-appearing, toxic-appearing or diaphoretic.   HENT:     Head: Normocephalic and atraumatic.     Right Ear: External ear normal.     Left Ear: External ear normal.     Nose: Nose normal.     Mouth/Throat:     Mouth: Mucous membranes are moist.     Pharynx: Oropharynx is clear.  Eyes:     General: No scleral icterus.       Right eye: No discharge.        Left eye: No discharge.     Extraocular Movements: Extraocular movements intact.     Conjunctiva/sclera: Conjunctivae normal.     Pupils: Pupils are equal, round, and reactive to light.  Cardiovascular:     Rate and Rhythm: Normal rate.  Pulmonary:     Effort: Pulmonary effort is normal.  Abdominal:     General: Bowel sounds are normal. There is no distension.     Palpations: Abdomen is soft. There is no mass.     Tenderness: There is no abdominal tenderness. There is no right CVA tenderness, left CVA tenderness, guarding or rebound.  Skin:    General: Skin is warm and dry.  Neurological:     General: No focal deficit present.     Mental Status: She is alert and oriented to person, place, and time.  Psychiatric:        Mood and Affect: Mood normal.        Behavior: Behavior normal.        Thought Content: Thought content normal.        Judgment: Judgment normal.  Results for orders placed or performed during the hospital encounter of 10/27/20 (from the past 24 hour(s))  POCT urinalysis dipstick     Status: Abnormal   Collection Time: 10/27/20 10:50 AM  Result Value Ref Range   Color, UA yellow yellow   Clarity, UA clear clear   Glucose, UA negative negative mg/dL   Bilirubin, UA negative negative   Ketones, POC UA negative negative mg/dL   Spec Grav, UA 1.025 1.010 - 1.025   Blood, UA trace-intact (A) negative   pH, UA 5.5 5.0 - 8.0   Protein Ur, POC =30 (A) negative mg/dL   Urobilinogen, UA 0.2 0.2 or 1.0 E.U./dL   Nitrite, UA Positive (A) Negative   Leukocytes, UA Small (1+) (A) Negative    Assessment and Plan :   PDMP not reviewed this encounter.  1.  Acute cystitis with hematuria   2. Dysuria   3. Type 2 diabetes mellitus treated with insulin (HCC)   4. Stage 3 chronic kidney disease, unspecified whether stage 3a or 3b CKD (HCC)     Creatinine Clearance calculated at 97m/min.  Start Keflex to cover for acute cystitis, urine culture pending.  Recommended aggressive hydration, limiting urinary irritants. Counseled patient on potential for adverse effects with medications prescribed/recommended today, ER and return-to-clinic precautions discussed, patient verbalized understanding.    MJaynee Eagles PA-C 10/27/20 1121

## 2020-10-27 NOTE — Discharge Instructions (Addendum)

## 2020-10-27 NOTE — ED Triage Notes (Signed)
Pt present urinary frequency with burning sensation and foul odor. Symptom started on Monday.

## 2020-10-29 LAB — URINE CULTURE: Culture: >=100000 — AB

## 2020-10-31 ENCOUNTER — Telehealth: Payer: Self-pay

## 2020-10-31 NOTE — Chronic Care Management (AMB) (Addendum)
Chronic Care Management Pharmacy Assistant   Name: Kathleen Hatfield  MRN: [id number] DOB: 23-Apr-1955  Reason for Encounter:  Diabetes Review  Recent office visits:  09/26/20 - PCP - Patient presented for follow up hospital admission for pneumonia. Recheck labs. Use topical nystatin for breast rash, take 1 more dose of metolazone tomorrow. Would continue prednisone taper. Start doxycycline with routine cautions. Restart Trulicity, she is going to taper prednisone and may need to taper insulin by 1 unit/day based on her a.m. sugars.  If sugar is below 120, she will cut 1 unit from her next dose.   Recent consult visits:  10/27/20 [facility] - Patient presented for acute cystitis. Start cephalexin course. 10/15/20 - Podiatry - Patient presented for follow up right foot/ankle pain. No medication changes. 10/11/20 - Pulmonology - Patient presented for follow up COPD and hospital follow up of pneumonia. No medication changes, discussed working on healthy weight loss.  Hospital visits: 7/14/-09/22/20 [facility] admission for CAP  Medications: Outpatient Encounter Medications as of 10/31/2020  Medication Sig   acetaminophen (TYLENOL) 500 MG tablet Take 500 mg by mouth every 6 (six) hours as needed for moderate pain.   albuterol (VENTOLIN HFA) 108 (90 Base) MCG/ACT inhaler Inhale 2 puffs into the lungs every 6 (six) hours as needed.   allopurinol (ZYLOPRIM) 100 MG tablet Take 0.5 tablets (50 mg total) by mouth daily.   BAYER CONTOUR NEXT TEST test strip 1 each by Other route daily. As directed   budesonide (PULMICORT) 0.5 MG/2ML nebulizer solution Take 2 mLs (0.5 mg total) by nebulization 2 (two) times daily.   buPROPion (WELLBUTRIN XL) 300 MG 24 hr tablet TAKE 1 TABLET BY MOUTH DAILY   cephALEXin (KEFLEX) 500 MG capsule Take 1 capsule (500 mg total) by mouth 2 (two) times daily.   Cholecalciferol (VITAMIN D-3) 5000 UNITS TABS Take 5,000 Units by mouth every other day.    colchicine 0.6 MG  tablet Take 1 tablet (0.6 mg total) by mouth daily as needed (for gout).   dicyclomine (BENTYL) 20 MG tablet TAKE 1 TABLET UP TO FOUR TIMES A DAY FOR GI CRAMPING, PAIN, AND NAUSEA/VOMITING AS NEEDED   doxycycline (VIBRA-TABS) 100 MG tablet Take 1 tablet (100 mg total) by mouth 2 (two) times daily.   Dulaglutide (TRULICITY) 3 FE/0.7HQ SOPN Inject 3 mg as directed once a week.   DULoxetine (CYMBALTA) 60 MG capsule TAKE 1 CAPSULE BY MOUTH ONCE DAILY.   furosemide (LASIX) 20 MG tablet Take 3 tablets (60 mg total) by mouth 2 (two) times daily.   gabapentin (NEURONTIN) 100 MG capsule TAKE 3 CAPSULES BY MOUTH AT BEDTIME.   GLOBAL EASE INJECT PEN NEEDLES 32G X 4 MM MISC USE AS DIRECTED WITH VICTOZA AND TRESIBA.   guaiFENesin-dextromethorphan (ROBITUSSIN DM) 100-10 MG/5ML syrup Take 5 mLs by mouth every 4 (four) hours as needed for cough.   insulin degludec (TRESIBA FLEXTOUCH) 200 UNIT/ML FlexTouch Pen INJECT 88 UNITS UNDER SKIN ONCE DAILY   ipratropium-albuterol (DUONEB) 0.5-2.5 (3) MG/3ML SOLN Take 3 mLs by nebulization every 6 (six) hours as needed (wheezing).   meloxicam (MOBIC) 15 MG tablet TAKE 1 TABLET (15 MG TOTAL) BY MOUTH DAILY.   mesalamine (LIALDA) 1.2 g EC tablet Take 1.2 g by mouth 2 (two) times daily.   metolazone (ZAROXOLYN) 5 MG tablet TAKE 1 TABLET BY MOUTH AS NEEDED.   MICROLET LANCETS MISC 1 each by Other route. As directed   nystatin (MYCOSTATIN/NYSTOP) powder Apply 1  application topically 3 (three) times daily.   ondansetron (ZOFRAN) 4 MG tablet Take 1 tablet (4 mg total) by mouth every 6 (six) hours.   pantoprazole (PROTONIX) 40 MG tablet TAKE 1 TABLET BY MOUTH AT BEDTIME   polyethylene glycol (MIRALAX / GLYCOLAX) packet Take 17 g by mouth daily as needed for mild constipation.   potassium chloride (MICRO-K) 10 MEQ CR capsule TAKE 5 CAPSULES (50 MEQ TOTAL) BY MOUTH 2 (TWO) TIMES DAILY.   propranolol (INDERAL) 10 MG tablet TAKE 1 TABLET BY MOUTH 2 TIMES DAILY   revefenacin (YUPELRI)  175 MCG/3ML nebulizer solution Take 3 mLs (175 mcg total) by nebulization daily.   rOPINIRole (REQUIP) 4 MG tablet TAKE 1 TABLET BY MOUTH TWICE DAILY   spironolactone (ALDACTONE) 25 MG tablet TAKE 1 TABLET BY MOUTH DAILY.   traMADol (ULTRAM) 50 MG tablet Take 1 tablet (50 mg total) by mouth 3 (three) times daily as needed.   UNABLE TO FIND CPAP- At bedtime   No facility-administered encounter medications on file as of 10/31/2020.      Recent Relevant Labs: Lab Results  Component Value Date/Time   HGBA1C 8.5 (H) 09/20/2020 01:14 AM   HGBA1C 9.9 (H) 06/11/2020 08:35 AM    Kidney Function Lab Results  Component Value Date/Time   CREATININE 1.48 (H) 09/26/2020 03:18 PM   CREATININE 1.37 (H) 09/22/2020 03:40 AM   CREATININE 1.16 09/04/2019 12:00 AM   CREATININE 1.3 04/26/2019 12:00 AM   GFR 36.88 (L) 09/26/2020 03:18 PM   GFRNONAA 43 (L) 09/22/2020 03:40 AM   GFRAA 69 01/19/2020 01:56 PM     Contacted patient on 11/01/20 to discuss diabetes disease state.   Current antihyperglycemic regimen:  Trulicity 3 mg - Inject once weekly on Thursdays  Tresiba - 90 units daily     Patient verbally confirms she is taking the above medications as directed. Yes  What diet changes have been made to improve diabetes control? The patient reports she is watching her carbs and sugars and still cannot loose weight   What recent interventions/DTPs have been made to improve glycemic control: None identified. The patient reports BG stay in 200s. The patient reports prednisone and antibiotics recently and still on antibiotics for UTI.  Have there been any recent hospitalizations or ED visits since last visit with CPP? Yes - pneumonia  Patient denies hypoglycemic symptoms, including Pale, Sweaty, Shaky, Hungry, Nervous/irritable, and Vision changes  Patient denies hyperglycemic symptoms, including blurry vision, excessive thirst, fatigue, polyuria, and weakness  How often are you checking your blood  sugar? twice daily  What are your blood sugars ranging?  Fasting: 211,221,172 Bedtime: [phone]  During the week, how often does your blood glucose drop below 70? Never  Are you checking your feet daily/regularly? Yes  The patient is frustrated with her high BG. She reports Victoza in the past and wonders if restarting this medication would help bring her BG's down. Will discuss with CPP  Adherence Review: Is the patient currently on a STATIN medication? No Is the patient currently on ACE/ARB medication? No Does the patient have >5 day gap between last estimated fill dates? No  Counseled patient on importance of annual eye and foot exam.   Star Rating Drugs:  Medication:  Last Fill: Day Supply Tresiba [id number] 9/47/09 20 Trulicity 21m  76/28/3628  Upcoming appts: PCP appointment on 12/24/20  MDebbora Dus CPP notified  VAvel Sensor [place]Assistant [phone] I have reviewed the care management and care  coordination activities outlined in this encounter and I am certifying that I agree with the content of this note. Schedule visit to discuss BG.  Debbora Dus, PharmD Clinical Pharmacist [place] [facility] at [facility] [phone]

## 2020-11-07 ENCOUNTER — Telehealth: Payer: Self-pay

## 2020-11-07 NOTE — Progress Notes (Addendum)
Spoke with the patient and confirmed appointment with Debbora Dus CPP to discuss blood glucose. She will record BID BG for the next 2 weeks and have available for the consultation.   She did report fasting:  11/01/20-221   11/02/20-170   11/03/20-219   11/04/20-175  She is frustrated with fluid retention as current weight is 269 lbs. She averages 255 lbs.  Debbora Dus, CPP notified  [name], [place] Assistant [phone]  I have reviewed the care management and care coordination activities outlined in this encounter and I am certifying that I agree with the content of this note. Please have patient contact cardiology office to report weight gain/fluid retention asap.  Debbora Dus, PharmD Clinical Pharmacist [facility] at [facility] [phone]

## 2020-11-12 NOTE — Progress Notes (Addendum)
Spoke with the patient and she agrees to contact the Cardiology office today. She will discuss weight gain/fluid retention.  Debbora Dus, CPP notified  [name], [place] Assistant [phone]

## 2020-11-13 ENCOUNTER — Telehealth: Payer: Self-pay

## 2020-11-13 NOTE — Chronic Care Management (AMB) (Addendum)
Chronic Care Management Pharmacy Assistant   Name: Kathleen Hatfield  MRN: [id number] DOB: 03/24/1955   Reason for Encounter: Reminder Call   Conditions to be addressed/monitored: HTN, HLD, COPD, DMII, and CKD Stage 2  Medications: Outpatient Encounter Medications as of 11/13/2020  Medication Sig   acetaminophen (TYLENOL) 500 MG tablet Take 500 mg by mouth every 6 (six) hours as needed for moderate pain.   albuterol (VENTOLIN HFA) 108 (90 Base) MCG/ACT inhaler Inhale 2 puffs into the lungs every 6 (six) hours as needed.   allopurinol (ZYLOPRIM) 100 MG tablet Take 0.5 tablets (50 mg total) by mouth daily.   BAYER CONTOUR NEXT TEST test strip 1 each by Other route daily. As directed   budesonide (PULMICORT) 0.5 MG/2ML nebulizer solution Take 2 mLs (0.5 mg total) by nebulization 2 (two) times daily.   buPROPion (WELLBUTRIN XL) 300 MG 24 hr tablet TAKE 1 TABLET BY MOUTH DAILY   cephALEXin (KEFLEX) 500 MG capsule Take 1 capsule (500 mg total) by mouth 2 (two) times daily.   Cholecalciferol (VITAMIN D-3) 5000 UNITS TABS Take 5,000 Units by mouth every other day.    colchicine 0.6 MG tablet Take 1 tablet (0.6 mg total) by mouth daily as needed (for gout).   dicyclomine (BENTYL) 20 MG tablet TAKE 1 TABLET UP TO FOUR TIMES A DAY FOR GI CRAMPING, PAIN, AND NAUSEA/VOMITING AS NEEDED   doxycycline (VIBRA-TABS) 100 MG tablet Take 1 tablet (100 mg total) by mouth 2 (two) times daily.   Dulaglutide (TRULICITY) 3 NF/6.2ZH SOPN Inject 3 mg as directed once a week.   DULoxetine (CYMBALTA) 60 MG capsule TAKE 1 CAPSULE BY MOUTH ONCE DAILY.   furosemide (LASIX) 20 MG tablet Take 3 tablets (60 mg total) by mouth 2 (two) times daily.   gabapentin (NEURONTIN) 100 MG capsule TAKE 3 CAPSULES BY MOUTH AT BEDTIME.   GLOBAL EASE INJECT PEN NEEDLES 32G X 4 MM MISC USE AS DIRECTED WITH VICTOZA AND TRESIBA.   guaiFENesin-dextromethorphan (ROBITUSSIN DM) 100-10 MG/5ML syrup Take 5 mLs by mouth every 4 (four) hours as  needed for cough.   insulin degludec (TRESIBA FLEXTOUCH) 200 UNIT/ML FlexTouch Pen INJECT 88 UNITS UNDER SKIN ONCE DAILY   ipratropium-albuterol (DUONEB) 0.5-2.5 (3) MG/3ML SOLN Take 3 mLs by nebulization every 6 (six) hours as needed (wheezing).   meloxicam (MOBIC) 15 MG tablet TAKE 1 TABLET (15 MG TOTAL) BY MOUTH DAILY.   mesalamine (LIALDA) 1.2 g EC tablet Take 1.2 g by mouth 2 (two) times daily.   metolazone (ZAROXOLYN) 5 MG tablet TAKE 1 TABLET BY MOUTH AS NEEDED.   MICROLET LANCETS MISC 1 each by Other route. As directed   nystatin (MYCOSTATIN/NYSTOP) powder Apply 1 application topically 3 (three) times daily.   ondansetron (ZOFRAN) 4 MG tablet Take 1 tablet (4 mg total) by mouth every 6 (six) hours.   pantoprazole (PROTONIX) 40 MG tablet TAKE 1 TABLET BY MOUTH AT BEDTIME   polyethylene glycol (MIRALAX / GLYCOLAX) packet Take 17 g by mouth daily as needed for mild constipation.   potassium chloride (MICRO-K) 10 MEQ CR capsule TAKE 5 CAPSULES (50 MEQ TOTAL) BY MOUTH 2 (TWO) TIMES DAILY.   propranolol (INDERAL) 10 MG tablet TAKE 1 TABLET BY MOUTH 2 TIMES DAILY   revefenacin (YUPELRI) 175 MCG/3ML nebulizer solution Take 3 mLs (175 mcg total) by nebulization daily.   rOPINIRole (REQUIP) 4 MG tablet TAKE 1 TABLET BY MOUTH TWICE DAILY   spironolactone (ALDACTONE) 25 MG tablet TAKE 1 TABLET  BY MOUTH DAILY.   traMADol (ULTRAM) 50 MG tablet Take 1 tablet (50 mg total) by mouth 3 (three) times daily as needed.   UNABLE TO FIND CPAP- At bedtime   No facility-administered encounter medications on file as of 11/13/2020.   Kathleen Hatfield was contacted to remind her of her upcoming telephone visit with Debbora Dus on 11/18/2020 at 3:00pm. Patient was reminded to have all medications, supplements and any blood glucose and blood pressure readings available for review at appointment.   Are you having any problems with your medications? No  Do you have any concerns you like to discuss with the pharmacist?  Yes  the patient would like to discuss elevated BG's and if medication change is necessary.    Star Rating Drugs: Medication:  Last Fill: Day Supply Tresiba [id number] 9/47/65 20 Trulicity 4YT/0.3 5/46/56 [place], CPP notified  [name], [place] Assistant [phone]  I have reviewed the care management and care coordination activities outlined in this encounter and I am certifying that I agree with the content of this note. No further action required.  Debbora Dus, PharmD Clinical Pharmacist [place] [facility] at [facility] [phone]

## 2020-11-14 ENCOUNTER — Other Ambulatory Visit: Payer: Self-pay | Admitting: Family Medicine

## 2020-11-14 NOTE — Telephone Encounter (Signed)
Last refilled on 08/18/20 #45 with 0 refill LOV 09/26/20 hospital follow up Next appointment on 12/24/20 DM follow up

## 2020-11-15 NOTE — Telephone Encounter (Signed)
Sent. Thanks.   

## 2020-11-18 ENCOUNTER — Ambulatory Visit (INDEPENDENT_AMBULATORY_CARE_PROVIDER_SITE_OTHER): Payer: Medicare HMO

## 2020-11-18 ENCOUNTER — Telehealth: Payer: Self-pay

## 2020-11-18 ENCOUNTER — Other Ambulatory Visit: Payer: Self-pay

## 2020-11-18 DIAGNOSIS — E1165 Type 2 diabetes mellitus with hyperglycemia: Secondary | ICD-10-CM

## 2020-11-18 DIAGNOSIS — I10 Essential (primary) hypertension: Secondary | ICD-10-CM

## 2020-11-18 DIAGNOSIS — E782 Mixed hyperlipidemia: Secondary | ICD-10-CM

## 2020-11-18 NOTE — Telephone Encounter (Signed)
Patient reports her weight has been up for several weeks with swelling in ankles and abdomen. She continues Lasix 60 mg BID, spironolactone 25 mg daily, and metolazone 5 mg (as needed - last dose on 9/5). Reports her baseline weight is closer to 255 lbs.  She is currently at 263. See weights below. She called cardiology but reports soonest available was January 2023.   09/2 - 266 lbs 09/3 - 263 lbs 09/4 - 265 lbs 09/5 - 262 lbs - took metolazone  09/6 - 262 lbs 09/7 - 263 lbs 09/9 - 263 lbs  Lab Results  Component Value Date   CREATININE 1.48 (H) 09/26/2020   BUN 31 (H) 09/26/2020   GFR 36.88 (L) 09/26/2020   GFRNONAA 43 (L) 09/22/2020   GFRAA 69 01/19/2020   NA 136 09/26/2020   K 4.6 09/26/2020   CALCIUM 9.5 09/26/2020   CO2 32 09/26/2020   Debbora Dus, PharmD Clinical Pharmacist [facility] at [facility] [phone]

## 2020-11-18 NOTE — Progress Notes (Signed)
Chronic Care Management Pharmacy Note 11/18/20 Name:  Kathleen Hatfield MRN:  [id number] DOB:  02-18-1956  Summary: Patient BG are elevated at home. She has been checking daily this week. Fasting BG 160-180s. Open to increasing Trulicity dose to 4.5 mg weekly. Will consult PCP. Also reports increased SOB and swelling - sent telephone note to provider for visit since she was unable to get into cardiology this week.  Recommendations:  PCP visit to discuss SOB/swelling Recommend increase Trulicity to 4.5 mg weekly  Plan: CCM visit 6 weeks Sent new rx for Trulicity 4.5 mg [facility] [place]  Subjective: Kathleen Hatfield is an 65 y.o. year old female who is a primary patient of [name], [name], MD.  The CCM team was consulted for assistance with disease management and care coordination needs.    Engaged with patient by telephone for follow up visit in response to provider referral for pharmacy case management and/or care coordination services.   Consent to Services:  The patient was given information about Chronic Care Management services, agreed to services, and gave verbal consent prior to initiation of services.  Please see initial visit note for detailed documentation.   Patient Care Team: [name], MD as PCP - General (Family Medicine) [name], MD as PCP - Cardiology (Cardiology) [name], MD as Consulting Physician (Pulmonary Disease) [name], MD as Consulting Physician (General Surgery) Valliant, [name], MD as Consulting Physician (Pulmonary Disease) Debbora Dus, [facility] as Pharmacist (Pharmacist) Dannielle Karvonen, RN as Case Manager  Recent office visits:  09/26/20 - PCP - Patient presented for follow up hospital admission for pneumonia. Recheck labs. Use topical nystatin for breast rash, take 1 more dose of metolazone tomorrow. Would continue prednisone taper. Start doxycycline with routine cautions. Restart Trulicity, she is going to taper  prednisone and may need to taper insulin by 1 unit/day based on her morning sugars.  If sugar is below 120, she will cut 1 unit from her next dose.    Recent consult visits:  10/27/20 [facility] - Patient presented for acute cystitis. Start cephalexin course. 10/15/20 - Podiatry - Patient presented for follow up right foot/ankle pain. No medication changes. 10/11/20 - Pulmonology - Patient presented for follow up COPD and hospital follow up of pneumonia. No medication changes, discussed working on healthy weight loss. Follow up 2 months.    Hospital visits: 7/14/-09/22/20 [facility] admission for CAP  Objective:  Lab Results  Component Value Date   CREATININE 1.44 (H) 11/28/2020   BUN 27 (H) 11/28/2020   GFR 38.07 (L) 11/28/2020   GFRNONAA 43 (L) 09/22/2020   GFRAA 69 01/19/2020   NA 138 11/28/2020   K 3.2 (L) 11/28/2020   CALCIUM 9.9 11/28/2020   CO2 35 (H) 11/28/2020   GLUCOSE 317 (H) 11/28/2020    Lab Results  Component Value Date/Time   HGBA1C 9.4 (H) 11/28/2020 03:56 PM   HGBA1C 8.5 (H) 09/20/2020 01:14 AM   GFR 38.07 (L) 11/28/2020 03:56 PM   GFR 36.88 (L) 09/26/2020 03:18 PM    Last diabetic Eye exam:  Lab Results  Component Value Date/Time   HMDIABEYEEXA No Retinopathy 12/22/2019 12:00 AM    Last diabetic Foot exam: 04/16/20 - normal    Lab Results  Component Value Date   CHOL 333 04/26/2019   HDL 34 (L) 03/25/2013   LDLCALC 133 (H) 03/25/2013   TRIG 469 (A) 04/26/2019   CHOLHDL 6.9 03/25/2013  Hepatic Function Latest Ref Rng & Units 11/28/2020 09/20/2020 09/19/2020  Total Protein 6.0 - 8.3 g/dL 7.0 7.2 7.2  Albumin 3.5 - 5.2 g/dL 3.8 3.3(L) 3.6  AST 0 - 37 U/L _0 ALT 0 - 35 U/L _1 Alk Phosphatase 39 - 117 U/L 92 83 84  Total Bilirubin 0.2 - 1.2 mg/dL 0.3 0.4 0.5  Bilirubin, Direct 0.0 - 0.3 mg/dL - - -    Lab Results  Component Value Date/Time   TSH 2.850 06/09/2019 12:50 PM   TSH 2.31 04/15/2015 03:45 PM    CBC Latest Ref  Rng & Units 11/28/2020 09/26/2020 09/22/2020  WBC 4.0 - 10.5 K/uL 11.1(H) 19.0 Repeated and verified X2.([place]) 15.9(H)  Hemoglobin 12.0 - 15.0 g/dL 12.7 13.4 12.8  Hematocrit 36.0 - 46.0 % 39.4 41.2 39.2  Platelets 150.0 - 400.0 K/uL 284.0 295.0 333    Lab Results  Component Value Date/Time   VD25OH 45.52 06/18/2020 04:52 PM   VD25OH 47.60 06/22/2019 01:59 PM    Clinical ASCVD: No  The ASCVD Risk score (Arnett DK, et al., 2019) failed to calculate for the following reasons:   The valid total cholesterol range is 130 to 320 mg/dL    Depression screen [facility] 2/9 08/12/2020 12/28/2017 09/13/2017  Decreased Interest _2 Down, Depressed, Hopeless _3 PHQ - 2 Score _4 Altered sleeping _5 Tired, decreased energy _6 Change in appetite 2 0 1  Feeling bad or failure about yourself  _7 Trouble concentrating 3 0 3  Moving slowly or fidgety/restless _8 Suicidal thoughts 0 0 0  PHQ-9 Score _9 Difficult doing work/chores Somewhat difficult Very difficult Very difficult  Some recent data might be hidden    Social History   Tobacco Use  Smoking Status Never  Smokeless Tobacco Never   BP Readings from Last 3 Encounters:  12/24/20 (!) 151/85  12/12/20 (!) 101/58  12/05/20 (!) 151/89   Pulse Readings from Last 3 Encounters:  12/24/20 90  12/12/20 68  12/05/20 77   Wt Readings from Last 3 Encounters:  12/12/20 274 lb (124.3 kg)  11/28/20 269 lb (122 kg)  10/11/20 268 lb 3.2 oz (121.7 kg)   BMI Readings from Last 3 Encounters:  12/12/20 45.60 kg/m  11/28/20 44.76 kg/m  10/11/20 44.63 kg/m   Iron/TIBC/Ferritin/ %Sat    Component Value Date/Time   IRON 37 (L) 11/28/2020 1556   IRON 66 09/19/2019 0000   TIBC 430 09/19/2019 0000   FERRITIN 64.4 11/28/2020 1556   FERRITIN 50 09/19/2019 0000   IRONPCTSAT 15 09/19/2019 0000    Assessment/Interventions: Review of patient past medical history, allergies, medications, health status, including review of  consultants reports, laboratory and other test data, was performed as part of comprehensive evaluation and provision of chronic care management services.   SDOH:  (Social Determinants of Health) assessments and interventions performed: Yes  SDOH Screenings   Alcohol Screen: Not on file  Depression (PHQ2-9): Medium Risk   PHQ-2 Score: 20  Financial Resource Strain: Low Risk    Difficulty of Paying Living Expenses: Not very hard  Food Insecurity: No Food Insecurity   Worried About Charity fundraiser in the Last Year: Never true   Ran Out of Food in the Last Year: Never true  Housing: Not on file  Physical Activity: Inactive   Days of Exercise  per Week: 0 days   Minutes of Exercise per Session: 0 min  Social Connections: Not on file  Stress: Not on file  Tobacco Use: Low Risk    Smoking Tobacco Use: Never   Smokeless Tobacco Use: Never   Passive Exposure: Not on file  Transportation Needs: No Transportation Needs   Lack of Transportation (Medical): No   Lack of Transportation (Non-Medical): No    CCM Care Plan  Allergies  Allergen Reactions   Almond Oil Anaphylaxis, Shortness Of Breath and Swelling   Morphine And Related Shortness Of Breath and Other (See Comments)    Pt. States while in the hospital it affected her breathing, O2 dropped to the 70's   Atorvastatin Other (See Comments)    Leg weakness   Ceclor [Cefaclor] Nausea And Vomiting   Sulfa Antibiotics Nausea And Vomiting   Ciprofloxacin Other (See Comments)    Makes joints and muscles ache   Levaquin [Levofloxacin] Other (See Comments)    Body aches   Losartan Other (See Comments)    Weakness    Statins Other (See Comments)    Leg and body weakness    Medications Reviewed Today     Reviewed by Debbora Dus, [facility] (Pharmacist) on 01/07/21 at 1008  Med List Status: <None>   Medication Order Taking? Sig Documenting Provider Last Dose Status Informant  acetaminophen (TYLENOL) 500 MG tablet [id number]  Take  500 mg by mouth every 6 (six) hours as needed for moderate pain. [provider]  Active Self  albuterol (VENTOLIN HFA) 108 (90 Base) MCG/ACT inhaler [id number]  Inhale 2 puffs into the lungs every 6 (six) hours as needed. [name], MD  Active Self  amoxicillin-clavulanate (AUGMENTIN) 875-125 MG tablet [id number]  Take 1 tablet by mouth every 12 (twelve) hours. Odis Luster, FNP  Active   BAYER CONTOUR NEXT TEST test strip [id number]  1 each by Other route daily. As directed [provider]  Active Self           Med Note Tamala Julian, JEFFREY W   Mon May 03, 2017 11:02 PM)    budesonide (PULMICORT) 0.5 MG/2ML nebulizer solution [id number]  Take 2 mLs (0.5 mg total) by nebulization 2 (two) times daily. [name], MD  Active Self  buPROPion (WELLBUTRIN XL) 300 MG 24 hr tablet [id number]  TAKE 1 TABLET BY MOUTH DAILY [name], MD  Active   Cholecalciferol (VITAMIN D-3) 5000 UNITS TABS [id number]  Take 5,000 Units by mouth every other day.  [provider]  Active Self  colchicine 0.6 MG tablet [id number]  Take 1 tablet (0.6 mg total) by mouth daily as needed (for gout). [name], MD  Active Self  dicyclomine (BENTYL) 20 MG tablet [id number]  TAKE 1 TABLET UP TO FOUR TIMES A DAY FOR GI CRAMPING, PAIN, AND NAUSEA/VOMITING AS NEEDED [name], MD  Active   Dulaglutide (TRULICITY) 3 SW/5.4OE SOPN [id number] Yes Inject 3 mg as directed once a week. [name], MD Taking Active Self  DULoxetine (CYMBALTA) 60 MG capsule [id number]  TAKE 1 CAPSULE BY MOUTH ONCE DAILY. [name], MD  Active   furosemide (LASIX) 20 MG tablet [id number] Yes Take 3 tablets (60 mg total) by mouth 2 (two) times daily. Richardson Dopp T, PA-C Taking Active Self  gabapentin (NEURONTIN) 100 MG capsule [id number]  TAKE 3 CAPSULES BY MOUTH AT BEDTIME. [name], MD  Active   GLOBAL EASE INJECT PEN NEEDLES 32G X 4  MM MISC [id number]  USE AS DIRECTED WITH VICTOZA AND TRESIBA.  [name], MD  Active Self  insulin degludec [facility] FLEXTOUCH) 200 UNIT/ML FlexTouch Pen [id number]  INJECT 90 UNITS UNDER SKIN ONCE DAILY [name], MD  Active   ipratropium-albuterol (DUONEB) 0.5-2.5 (3) MG/3ML SOLN [id number]  Take 3 mLs by nebulization every 6 (six) hours as needed (wheezing). [provider]  Active Self  Iron, Ferrous Sulfate, 325 (65 Fe) MG TABS [id number]  Take 325 mg by mouth daily. [name], MD  Active   mesalamine (LIALDA) 1.2 g EC tablet [id number]  Take 1.2 g by mouth 2 (two) times daily. [provider]  Active Self           Med Note Fleet Contras Sep 26, 2020  2:58 PM)    metolazone (ZAROXOLYN) 5 MG tablet [id number]  TAKE 1 TABLET BY MOUTH AS NEEDED. [name], MD  Active Self  MICROLET LANCETS [place] [id number]  1 each by Other route. As directed [provider]  Active Self           Med Note Mickie Bail Oct 21, 2015  3:31 PM)    nystatin (MYCOSTATIN/NYSTOP) powder [id number]  Apply 1 application topically 3 (three) times daily. [name], MD  Active   ondansetron [facility]) 4 MG tablet [id number]  Take 1 tablet (4 mg total) by mouth every 6 (six) hours. Faustino Congress, NP  Active Self  oxyCODONE-acetaminophen (PERCOCET/ROXICET) 5-325 MG tablet [id number]  Take 1 tablet by mouth every 6 (six) hours as needed for severe pain. Blanchie Dessert, MD  Active   pantoprazole (PROTONIX) 40 MG tablet [id number]  TAKE 1 TABLET BY MOUTH AT BEDTIME [name], MD  Active Self  polyethylene glycol [facility] / GLYCOLAX) packet [id number]  Take 17 g by mouth daily as needed for mild constipation. [provider]  Active Self  potassium chloride (MICRO-K) 10 MEQ CR capsule [id number]  TAKE 5 CAPSULES (50 MEQ TOTAL) BY MOUTH 2 (TWO) TIMES DAILY Kathlen Mody, Scott T, PA-C  Active   predniSONE (DELTASONE) 20 MG tablet [id number]  Take 2 tablets (40 mg total) by mouth daily. Blanchie Dessert, MD   Active   propranolol (INDERAL) 10 MG tablet [id number]  TAKE 1 TABLET BY MOUTH 2 TIMES DAILY [name], MD  Active   revefenacin [facility]) 175 MCG/3ML nebulizer solution [id number]  Take 3 mLs (175 mcg total) by nebulization daily. [name], MD  Active Self  rOPINIRole (REQUIP) 4 MG tablet [id number]  TAKE 1 TABLET BY MOUTH TWICE DAILY [name], MD  Active   spironolactone (ALDACTONE) 25 MG tablet [id number]  TAKE 1 TABLET BY MOUTH DAILY. [name], MD  Active   traMADol [facility]at[facility] [place]) 50 MG tablet [id number]  Take 1 tablet (50 mg total) by mouth 3 (three) times daily as needed. [name], MD  Active Self  UNABLE TO FIND [id number]  CPAP- At bedtime [provider]  Active Self            Patient Active Problem List   Diagnosis Date Noted   Rash 09/27/2020   COPD exacerbation ([place]) 09/21/2020   Acute exacerbation of COPD with asthma ([place]) 09/19/2020   Mixed diabetic hyperlipidemia associated with type 2 diabetes mellitus ([place]) 09/19/2020   Pain of foot 06/20/2020   Medicare annual wellness visit, initial 06/20/2020   Constipation 06/11/2020   Diverticulosis of colon 06/11/2020  Irritable bowel syndrome 94/09/6806   Periumbilical pain 81/12/3157   RLS (restless legs syndrome)    Acute asthma exacerbation 01/09/2020   Asthma exacerbation 01/08/2020   Muscle weakness 01/08/2020   Grade I diastolic dysfunction 45/85/9292   Statin myopathy 10/10/2019   Advance care planning 06/14/2019   RLQ abdominal pain 06/14/2019   COPD (chronic obstructive pulmonary disease) ([place]) 06/14/2019   Leukocytosis 06/14/2019   Hypercalcemia 06/14/2019   Gout 06/14/2019   Type 2 diabetes mellitus with hyperglycemia, without long-term current use of insulin ([place])    COPD with acute exacerbation ([place]) 05/03/2017   Acute renal failure superimposed on stage 2 chronic kidney disease ([place]) 04/02/2016   Diabetes mellitus without complication (HCC)    CKD (chronic kidney  disease)    Hypersomnia with sleep apnea 09/16/2015   Fatigue 06/07/2015   Morbid obesity ([place]) 06/07/2015   Chronic diastolic heart failure (HCC)    Pneumonia of both lower lobes due to infectious organism 02/09/2013   OSA on CPAP 08/23/2012   Essential hypertension    Depression    GERD without esophagitis    Hyperlipidemia    Ulcerative colitis ([place])    Patellar tendinitis 05/25/2011   Knee pain 05/25/2011   Myofascial pain 05/25/2011   Cervicalgia 05/25/2011    Immunization History  Administered Date(s) Administered   Influenza Split 12/01/2012, 01/18/2017   Influenza Whole 12/25/2011   Influenza,inj,Quad PF,6+ Mos 11/27/2013, 12/08/2014, 12/11/2015   Influenza-Unspecified 12/07/2017   Janssen (J&J) SARS-COV-2 Vaccination 06/15/2019   Pneumococcal Conjugate-13 02/17/2017   Pneumococcal Polysaccharide-23 11/27/2013    Conditions to be addressed/monitored:  Hypertension, Hyperlipidemia, Diabetes, Heart Failure, COPD, Asthma, Chronic Kidney Disease, Depression and Osteoarthritis  Care Plan : [place]  Updates made by Debbora Dus, [place] since 01/07/2021 12:00 AM     Problem: CHL AMB "PATIENT-SPECIFIC PROBLEM"      Long-Range Goal: Disease Management   Start Date: 07/29/2020  This Visit's Progress: Not on track  Priority: High  Note:   Current Barriers:  Unable to maintain control of diabetes  Recent COPD exacerbation  Pharmacist Clinical Goal(s):  Patient will contact provider office for questions/concerns as evidenced notation of same in electronic health record through collaboration with PharmD and provider.   Interventions: 1:1 collaboration with [name], MD regarding development and update of comprehensive plan of care as evidenced by provider attestation and co-signature Inter-disciplinary care team collaboration (see longitudinal plan of care) Comprehensive medication review performed; medication list updated in electronic medical  record  Hypertension (BP goal < 140/90) Controlled, Stable  -Current treatment: Propranolol 10 mg - 1 twice daily  Spironolactone 25 mg - 1 tablet daily in morning  -Medications previously tried: metoprolol (switched to propranolol due to tremors 10/2019), losartan (fatigue, weakness) -Home BP: Patient has not checked since June, clinic readings within goal -Recommend continue current medications   Hyperlipidemia: (LDL goal < 100) -Not ideally controlled (TG 460 in 04/2019 and LDL inaccurate due to elevated TG) -Current treatment: None -Medications previously tried: Multiple statins (atorvastatin, others) - leg weakness -Educated on Cholesterol goals;  -Recommended diabetes control to reduce cardiovascular risk. Patient is due for updated lipid panel.  Diabetes (A1c goal <7%) -Uncontrolled - A1c 8.5% (07/22), improving -Current medications: Trulicity 3 mg - Inject once weekly  Tresiba - 90 units daily (mid-day around 1- 4 PM) -Medications previously tried: none reported -Current home glucose readings - checking daily before first meal -Checking first thing in morning and then again 2 hours after  supper 9/10 - 214 (morning after juice - 3:40 pm) 9/9 - 196 (12:30 PM)  9/8 - 270 (8:10 PM) 9/7 - 161 (12:30 PM), 233 (2:50 AM) 9/6 - 184 (11:45 AM) 9/5 - 171 (11:40 AM), 243 (9:30 PM) 9/4 - 189 (1:15 PM), 195 (11:20 PM) 9/3 - 165 (12 PM), 276 (3 AM) 9/2 - 182 (3 PM), 186 (3 AM) -Denies hypoglycemic/hyperglycemic symptoms, denies any BG levels < 70 -Current meal patterns: The patient reports she is on a salt free, sugar free, gluten free diet but had a hard time sticking to this. She tries to limit portions.  -Educated on A1c and blood sugar goals; -Counseled to check feet daily and get yearly eye exams - annual eye and foot exam up to date  -Recommended to continue current medication; Continue to monitor - Will discuss increasing Trulicity 4.5 mg once weekly with PCP.  Heart Failure  (Goal: manage symptoms and prevent exacerbations) -Not controlled - pt reports symptoms of weight gain and SOB today -Followed by Dr. [name] with [facility] -Last ejection fraction: 70% normal (Date: 03/21) -HF type: Diastolic -NYHA Class: II (slight limitation of activity) -AHA HF Stage: C (Heart disease and symptoms present) -Current treatment: Spironolactone 25 mg - 1 tablet daily in morning  Furosemide 20 mg - 3 tablets at breakfast and noon  Potassium 10 mEq - 5 capsules at breakfast and noon Metolazone - take 1 tablet as needed for weight gain of 3 pounds in 24 hours or 5 pounds in 1 week -Medications previously tried: torsemide, bumetanide  She has been checking weight daily:  09/2 - 266 lbs 09/3 - 263 lbs 09/4 - 265 lbs 09/5 - 262 lbs - took metolazone  09/6 - 262 lbs 09/7 - 263 lbs 09/9 - 263 lbs -Adherence - Affims adherence to 60 mg BID Lasix most days. She took metolazone based on symptoms rather than weight gain.  -She reports symptoms of increased swelling and shortness of breath the past several weeks. Patient called cardiology and states next available appt was months out.  -Recommended to continue current medication; Sent telephone note to PCP regarding SOB/swelling.   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) -Followed by [name], MD [facility]. Last seen 10/2020 for follow up after CAP/COPD exacerbation 09/19/20. No medication changes. -Current treatment  Maintenance: Pulmicort nebuilzer - one vial nebulized in the morning and one vial nebulized in the evening, and rinse your mouth after each use of this Yupelri nebulizer - one vial nebulized daily Rescue: Albuterol nebulizer - takes 2-3 times per day as needed -Compliant with CPAP -Medications previously tried: per pt inhalers less effective than nebulizers, Perforomist was unaffordable; She was previously on Advair, Incruse and most recently Trelegy but  says she has stopped these because she does not like the powder inhalers. -Gold Grade: Gold 3 (FEV1 30-49%) -Pulmonary function testing: 2021 FEV1 % pred (post) 42% -Current COPD Classification:  D (high sx, >2 exacerbations/yr) -Exacerbations requiring treatment in last 6 months: yes 09/18/20 virtual visit for COPD exacerbation,  started prednisone course, followed by hospital admission for CAP. -Patient reports consistent use of maintenance nebulizers. It is difficult to complete each medication as it takes time.  -Frequency of rescue inhaler use: 2-3 times daily  -Recommended to continue current medication; Continue follow up with pulmonology - if not discussed before, may suggest trial of Breztri since this is an MDI and not a dry powdered inhaler.  Depression/Anxiety/Pain(Goal: Control symptoms) -  Controlled, stable per patient - not discussed today 11/18/20 -Current treatment: Bupropion 300 mg - 1 tablet daily  Cymbalta 60 mg - 1 in the evening (for mood and neuropathy) Gabapentin 100 mg - 3 caps at bedtime  Ropinirole 4 mg - 1 tablet twice daily for RLS  -Medications previously tried/failed: none -I am concerned her ropinirole may be causing her to fall asleep suddenly during the day. This has been discussed with patient before and per chart records, she was unable to tolerate dose reductions. We will revisit this issue at follow up visit.  -Recommended to continue current medication  Gout (Goal: Prevent gout flares) -Improving, PCP started allopurinol 08/18/20 - not discussed today 11/18/20 -Current treatment: Allopurinol 50 mg - 1/2 tablet daily (new) taking in evening Colchicine - 1 daily at first sign of flare -Recommended to continue current medication  Patient Goals/Self-Care Activities Patient will:  - follow up with PCP regarding shortness of breath/swelling  Follow Up Plan: Telephone follow up appointment with care management team member scheduled for: 6 weeks       Medication Assistance: None required.  Patient affirms current coverage meets needs.  Patient's preferred pharmacy is: [place], [place] [address] Phone: [phone] Fax: [phone]  [id number] [place] [address] [place]: [phone]: [phone] Uses pill box? No - she uses pill packs for maintenance meds   -She has a basket with the rest of her medications - inhalers, injections -She has a hard time remembering if she took them, including her packaged medications due to the timing throughout the day -She has a pill pack for breakfast and evening meds  Other -Lialda - not taking currently  As needed:  -Bentyl - uses as needed for flares -Tylenol - 1 before bedtime -Meloxicam 15 mg - short term for arthritis in ankle -Metolazone - for fluid overload  -Tramadol - PRN diverticulitis  Not in packs but daily -Furosemide - 3 in morning and 3 at lunch (4 hours after breakfast) -Potassium - 5 twice daily (capsules - tablets were difficult to swallow)  Morning -Wellbutrin - daily  -Vitamin D3 - every other day -Ropinirole 4 mg - twice daily  -Pantoprazole 40 mg -Spironolactone daily   Evening  -Cymbalta - daily  She gets packaged medications from [facility]   Care Plan and Follow Up Patient Decision:  Patient agrees to Care Plan and Follow-up.  MDebbora Dus PharmD Clinical Pharmacist [phone]

## 2020-11-19 NOTE — Telephone Encounter (Signed)
I would repeat 1 dose of metolazone now, continue her other baseline medications, and have her update [place] in 1-2 days about her weight and swelling.  Please see about getting her an office visit here next week.  If she is getting more short of breath or decompensating in the meantime that she needs to notify us/seek evaluation/go to ER.

## 2020-11-20 ENCOUNTER — Other Ambulatory Visit: Payer: Self-pay | Admitting: Family Medicine

## 2020-11-22 NOTE — Telephone Encounter (Signed)
Patient advised to take 1 dose of metolazone now and continue other meds. Patient will call back Monday with weight update and f/u scheduled for 11/28/20 at 3pm.

## 2020-11-25 ENCOUNTER — Telehealth: Payer: Self-pay

## 2020-11-25 ENCOUNTER — Other Ambulatory Visit: Payer: Self-pay | Admitting: Family Medicine

## 2020-11-25 NOTE — Telephone Encounter (Signed)
Pt called requested dicyclomine refill to [facility] due to stomach pain. Last written 11/14/19 with 1 refill. Will send to PCP for approval since this is an acute med, not chronic.  Debbora Dus, PharmD Clinical Pharmacist [facility] [phone]

## 2020-11-26 MED ORDER — DICYCLOMINE HCL 20 MG PO TABS: ORAL_TABLET | ORAL | 1 refills | Status: DC

## 2020-11-26 NOTE — Telephone Encounter (Signed)
Patient advised to try one more dose of the metolazone and keep OV as scheduled. Patient agreed to do so and will keep appt for 11/28/20.

## 2020-11-26 NOTE — Telephone Encounter (Addendum)
I spoke with Kathleen Hatfield;Kathleen Hatfield was notified and voiced understanding. Kathleen Hatfield said today her weight was 263 lbs. On 11/19/20 wt was 266 lbs and on 11/22/20 wt was 262 lbs. Kathleen Hatfield said her feet are still puffy and the lt foot is more swollen than the rt foot. Kathleen Hatfield does not think there is swelling in lower legs. Pts abd is "puffy". Kathleen Hatfield clothes still fit but are slightly snug at waistline and abd. Kathleen Hatfield having slight difficulty in breathing when walking around or any exertion. At times Kathleen Hatfield will have SOB while sitting but SOB is not as bad while sitting as when exerting herself. Kathleen Hatfield is having some spasms or cramping lt side below waistline. Kathleen Hatfield is urinating good amts. Kathleen Hatfield is not having a fever and no covid symptoms or exposure except the SOB thinks due to some swelling. Kathleen Hatfield wants to know if should take anymore Metolazone before OV on on 11/28/20 at 3 PM. [facility]. Kathleen Hatfield request cb after reviewed by Dr [name]. Will send note to Dr [name], Janett Billow CMA and Debbora Dus PharmD(would not let me route to [facility] and left v/m for [facility] about this phone note. Also please see 11/18/20 phone note.

## 2020-11-26 NOTE — Telephone Encounter (Signed)
Reasonable to try one more dose of metolazone in the meantime and the keep OV as scheduled.  Thanks .

## 2020-11-26 NOTE — Telephone Encounter (Signed)
Sent. Thanks.  Okay to continue prn use.  She has f/u pending for later this week.

## 2020-11-28 ENCOUNTER — Encounter: Payer: Self-pay | Admitting: Family Medicine

## 2020-11-28 ENCOUNTER — Ambulatory Visit (INDEPENDENT_AMBULATORY_CARE_PROVIDER_SITE_OTHER): Payer: Medicare HMO | Admitting: Family Medicine

## 2020-11-28 ENCOUNTER — Other Ambulatory Visit: Payer: Self-pay

## 2020-11-28 VITALS — BP 124/78 | HR 89 | Temp 97.3°F | Ht 65.0 in | Wt 269.0 lb

## 2020-11-28 DIAGNOSIS — I5032 Chronic diastolic (congestive) heart failure: Secondary | ICD-10-CM

## 2020-11-28 DIAGNOSIS — G2581 Restless legs syndrome: Secondary | ICD-10-CM

## 2020-11-28 DIAGNOSIS — M109 Gout, unspecified: Secondary | ICD-10-CM | POA: Diagnosis not present

## 2020-11-28 DIAGNOSIS — E1165 Type 2 diabetes mellitus with hyperglycemia: Secondary | ICD-10-CM | POA: Diagnosis not present

## 2020-11-28 MED ORDER — MELOXICAM 15 MG PO TABS: 7.5000 mg | ORAL_TABLET | Freq: Every day | ORAL | Status: DC | PRN

## 2020-11-28 NOTE — Patient Instructions (Addendum)
Go to the lab on the way out.   If you have mychart we'll likely use that to update you.    Take care.  Glad to see you. Stop meloxicam for now.  We'll be in touch.  We may need to change your lasix to torsemide after I see your labs.   Don't take metolazone in the meantime.

## 2020-11-28 NOTE — Progress Notes (Signed)
This visit occurred during the SARS-CoV-2 public health emergency.  Safety protocols were in place, including screening questions prior to the visit, additional usage of staff PPE, and extensive cleaning of exam room while observing appropriate contact time as indicated for disinfecting solutions.  Edema in abd and BLE, weight has been up for weeks from prior baseline per patient report.  Taking 144m lasix per day, either 60/60 or 80/40 in 2 doses.  Metolazone most recently used 2 days ago.  She used to have sig relief from that.  She did not notice significant diuresis with recent metolazone use. Weight usually ~263 last few days at home, in spite of metolazone use.    Sugar has been about 140-170s recently in the AMs.    SOB supine, some nights sleeping on 3 pillows, some nights closer to upright.  Still using CPAP at baseline.  Prev PNA resolved on last CXR.  She doesn't have pain like she did with PNA.  No fevers, chills, vomiting.  No sputum.  Minimal cough.    She has some cramping on the L side of the upper abdomen, brief cramping pain.  Not L chest pain but in the L mid axillary line and posterior to mid axillary line inferior to diaphragm.    She has noted occ jerking movements.  She is still taking Requip.  It clearly helped RLS/aching prev.   Meds, vitals, and allergies reviewed.   ROS: Per HPI unless specifically indicated in ROS section   GEN: nad, alert and oriented HEENT: ncat NECK: supple w/o LA CV: rrr.  PULM: ctab, no inc wob ABD: soft, +bs EXT: no edema SKIN: no acute rash  Diabetic foot exam: Normal inspection No skin breakdown No calluses  Normal DP pulses Normal sensation to light touch and monofilament Nails normal  35 minutes were devoted to patient care in this encounter (this includes time spent reviewing the patient's file/history, interviewing and examining the patient, counseling/reviewing plan with patient).

## 2020-11-29 LAB — CBC WITH DIFFERENTIAL/PLATELET
Basophils Absolute: 0.2 10*3/uL — ABNORMAL HIGH (ref 0.0–0.1)
Basophils Relative: 1.4 % (ref 0.0–3.0)
Eosinophils Absolute: 0.3 10*3/uL (ref 0.0–0.7)
Eosinophils Relative: 2.3 % (ref 0.0–5.0)
HCT: 39.4 % (ref 36.0–46.0)
Hemoglobin: 12.7 g/dL (ref 12.0–15.0)
Lymphocytes Relative: 25.9 % (ref 12.0–46.0)
Lymphs Abs: 2.9 10*3/uL (ref 0.7–4.0)
MCHC: 32.3 g/dL (ref 30.0–36.0)
MCV: 93.5 fl (ref 78.0–100.0)
Monocytes Absolute: 1 10*3/uL (ref 0.1–1.0)
Monocytes Relative: 8.7 % (ref 3.0–12.0)
Neutro Abs: 6.9 10*3/uL (ref 1.4–7.7)
Neutrophils Relative %: 61.7 % (ref 43.0–77.0)
Platelets: 284 10*3/uL (ref 150.0–400.0)
RBC: 4.21 Mil/uL (ref 3.87–5.11)
RDW: 15.1 % (ref 11.5–15.5)
WBC: 11.1 10*3/uL — ABNORMAL HIGH (ref 4.0–10.5)

## 2020-11-29 LAB — COMPREHENSIVE METABOLIC PANEL
ALT: 15 U/L (ref 0–35)
AST: 18 U/L (ref 0–37)
Albumin: 3.8 g/dL (ref 3.5–5.2)
Alkaline Phosphatase: 92 U/L (ref 39–117)
BUN: 27 mg/dL — ABNORMAL HIGH (ref 6–23)
CO2: 35 mEq/L — ABNORMAL HIGH (ref 19–32)
Calcium: 9.9 mg/dL (ref 8.4–10.5)
Chloride: 89 mEq/L — ABNORMAL LOW (ref 96–112)
Creatinine, Ser: 1.44 mg/dL — ABNORMAL HIGH (ref 0.40–1.20)
GFR: 38.07 mL/min — ABNORMAL LOW (ref >60.00)
Glucose, Bld: 317 mg/dL — ABNORMAL HIGH (ref 70–99)
Potassium: 3.2 mEq/L — ABNORMAL LOW (ref 3.5–5.1)
Sodium: 138 mEq/L (ref 135–145)
Total Bilirubin: 0.3 mg/dL (ref 0.2–1.2)
Total Protein: 7 g/dL (ref 6.0–8.3)

## 2020-11-29 LAB — BRAIN NATRIURETIC PEPTIDE: Pro B Natriuretic peptide (BNP): 50 pg/mL (ref 0.0–100.0)

## 2020-11-29 LAB — IRON: Iron: 37 ug/dL — ABNORMAL LOW (ref 42–145)

## 2020-11-29 LAB — URIC ACID: Uric Acid, Serum: 10.7 mg/dL — ABNORMAL HIGH (ref 2.4–7.0)

## 2020-11-29 LAB — HEMOGLOBIN A1C: Hgb A1c MFr Bld: 9.4 % — ABNORMAL HIGH (ref 4.6–6.5)

## 2020-11-29 LAB — FERRITIN: Ferritin: 64.4 ng/mL (ref 10.0–291.0)

## 2020-12-01 ENCOUNTER — Other Ambulatory Visit: Payer: Self-pay | Admitting: Family Medicine

## 2020-12-01 DIAGNOSIS — G2581 Restless legs syndrome: Secondary | ICD-10-CM

## 2020-12-01 DIAGNOSIS — E1165 Type 2 diabetes mellitus with hyperglycemia: Secondary | ICD-10-CM

## 2020-12-01 MED ORDER — IRON (FERROUS SULFATE) 325 (65 FE) MG PO TABS: 325.0000 mg | ORAL_TABLET | Freq: Every day | ORAL | 3 refills | Status: DC

## 2020-12-01 NOTE — Assessment & Plan Note (Signed)
History of.  Without edema now.  Discussed options.  Not having recent response to metolazone.  I advised her to stop meloxicam for now.  We talked about a potential change from furosemide to torsemide but I want to see her labs first. I asked her not to take metolazone in the meantime.

## 2020-12-01 NOTE — Assessment & Plan Note (Signed)
See notes on labs.  This clearly improved with Requip previously, per her report.

## 2020-12-05 ENCOUNTER — Other Ambulatory Visit: Payer: Self-pay

## 2020-12-05 ENCOUNTER — Ambulatory Visit
Admission: EM | Admit: 2020-12-05 | Discharge: 2020-12-05 | Disposition: A | Discharge status: Home / Self Care | Payer: Medicare HMO | Attending: Internal Medicine | Admitting: Internal Medicine

## 2020-12-05 DIAGNOSIS — R300 Dysuria: Secondary | ICD-10-CM | POA: Diagnosis not present

## 2020-12-05 DIAGNOSIS — R350 Frequency of micturition: Secondary | ICD-10-CM | POA: Diagnosis not present

## 2020-12-05 DIAGNOSIS — N3001 Acute cystitis with hematuria: Secondary | ICD-10-CM | POA: Insufficient documentation

## 2020-12-05 LAB — POCT URINALYSIS DIP (MANUAL ENTRY)
Bilirubin, UA: NEGATIVE
Glucose, UA: NEGATIVE mg/dL
Ketones, POC UA: NEGATIVE mg/dL
Nitrite, UA: NEGATIVE
Protein Ur, POC: 30 mg/dL — AB
Spec Grav, UA: >=1.03 — AB (ref 1.010–1.025)
Urobilinogen, UA: 0.2 E.U./dL
pH, UA: 5.5 (ref 5.0–8.0)

## 2020-12-05 MED ORDER — AMOXICILLIN-POT CLAVULANATE 875-125 MG PO TABS: 1.0000 | ORAL_TABLET | Freq: Two times a day (BID) | ORAL | 0 refills | Status: DC

## 2020-12-05 NOTE — ED Provider Notes (Signed)
[facility]    CSN: [id number] Arrival date & time: 12/05/20  1121      History   Chief Complaint Chief Complaint  Patient presents with   uti symptoms    HPI Kathleen Hatfield is a 65 y.o. female.   Patient presents with 1 week history of urinary burning, urinary frequency, darker colored urine, flank pain.  Denies vaginal discharge, hematuria, fever.  Patient was recently treated in August for UTI as well.  Denies frequent UTIs.    Past Medical History:  Diagnosis Date   Anemia    Arthritis    Asthma    Chronic diastolic CHF 41/6384   Echocardiogram 05/2019: EF 70, no RWMA, mild LVH, Gr 1 DD, normal RVSF, severe LVH, borderline asc Aorta (39 mm)   CKD (chronic kidney disease)    Depression    Diabetes mellitus without complication (HCC)    Diverticulitis    Dyspnea    with exertion   GERD (gastroesophageal reflux disease)    History of blood transfusion    Hyperlipidemia    cannot tolerate statins   Hypertension    patient states she has never had high blood pressure.    Nuclear stress test    Myoview 05/2019: EF 83, no ischemia or infarction; Low Risk   Peripheral neuropathy    Pneumonia    PONV (postoperative nausea and vomiting)    RLS (restless legs syndrome)    Sleep apnea    uses CPAP   Ulcerative colitis ([place])    dr Collene Mares    Patient Active Problem List   Diagnosis Date Noted   Rash 09/27/2020   COPD exacerbation ([place]) 09/21/2020   Acute exacerbation of COPD with asthma ([place]) 09/19/2020   Mixed diabetic hyperlipidemia associated with type 2 diabetes mellitus ([place]) 09/19/2020   Pain of foot 06/20/2020   Medicare annual wellness visit, initial 06/20/2020   Constipation 06/11/2020   Diverticulosis of colon 06/11/2020   Irritable bowel syndrome 53/64/6803   Periumbilical pain 21/22/4825   RLS (restless legs syndrome)    Acute asthma exacerbation 01/09/2020   Asthma exacerbation 01/08/2020   Muscle weakness 01/08/2020   Grade I diastolic  dysfunction 00/37/0488   Statin myopathy 10/10/2019   Advance care planning 06/14/2019   RLQ abdominal pain 06/14/2019   COPD (chronic obstructive pulmonary disease) ([place]) 06/14/2019   Leukocytosis 06/14/2019   Hypercalcemia 06/14/2019   Gout 06/14/2019   Type 2 diabetes mellitus with hyperglycemia, without long-term current use of insulin (HCC)    COPD with acute exacerbation ([place]) 05/03/2017   Acute renal failure superimposed on stage 2 chronic kidney disease ([place]) 04/02/2016   Diabetes mellitus without complication (HCC)    CKD (chronic kidney disease)    Hypersomnia with sleep apnea 09/16/2015   Fatigue 06/07/2015   Morbid obesity ([place]) 06/07/2015   Chronic diastolic heart failure (HCC)    Pneumonia of both lower lobes due to infectious organism 02/09/2013   OSA on CPAP 08/23/2012   Essential hypertension    Depression    GERD without esophagitis    Hyperlipidemia    Ulcerative colitis ([place])    Patellar tendinitis 05/25/2011   Knee pain 05/25/2011   Myofascial pain 05/25/2011   Cervicalgia 05/25/2011    Past Surgical History:  Procedure Laterality Date   ABDOMINAL HYSTERECTOMY     APPENDECTOMY     BREAST BIOPSY     BREAST SURGERY     left biopsy   CARDIAC CATHETERIZATION  CESAREAN SECTION     CHOLECYSTECTOMY     HERNIA REPAIR     INSERTION OF MESH N/A 11/15/2015   Procedure: INSERTION OF MESH;  Surgeon: [name], MD;  Location: [facility];  Service: General;  Laterality: N/A;   LAPAROSCOPIC LYSIS OF ADHESIONS N/A 11/15/2015   Procedure: LAPAROSCOPIC LYSIS OF ADHESIONS;  Surgeon: [name], MD;  Location: [facility];  Service: General;  Laterality: N/A;   RIGHT HEART CATHETERIZATION N/A 02/27/2013   Procedure: RIGHT HEART CATH;  Surgeon: Blane Ohara, MD;  Location: [facility] CATH LAB;  Service: Cardiovascular;  Laterality: N/A;   RIGHT HEART CATHETERIZATION N/A 12/14/2013   Procedure: RIGHT HEART CATH;  Surgeon: Larey Dresser, MD;  Location: [facility] CATH LAB;  Service:  Cardiovascular;  Laterality: N/A;   SIGMOIDECTOMY  2010   diverticular disease   VENTRAL HERNIA REPAIR N/A 11/15/2015   Procedure: LAPAROSCOPIC VENTRAL WALL HERNIA REPAIR;  Surgeon: [name], MD;  Location: [facility];  Service: General;  Laterality: N/A;    OB History   No obstetric history on file.      Home Medications    Prior to Admission medications   Medication Sig Start Date End Date Taking? Authorizing Provider  amoxicillin-clavulanate (AUGMENTIN) 875-125 MG tablet Take 1 tablet by mouth every 12 (twelve) hours. 12/05/20  Yes Odis Luster, FNP  acetaminophen (TYLENOL) 500 MG tablet Take 500 mg by mouth every 6 (six) hours as needed for moderate pain.    [provider]  albuterol (VENTOLIN HFA) 108 (90 Base) MCG/ACT inhaler Inhale 2 puffs into the lungs every 6 (six) hours as needed. 02/16/20   [name], MD  allopurinol (ZYLOPRIM) 100 MG tablet TAKE 1/2 TABLET BY MOUTH DAILY 11/15/20   [name], MD  BAYER CONTOUR NEXT TEST test strip 1 each by Other route daily. As directed 07/30/15   [provider]  budesonide (PULMICORT) 0.5 MG/2ML nebulizer solution Take 2 mLs (0.5 mg total) by nebulization 2 (two) times daily. 05/23/20   [name], MD  buPROPion (WELLBUTRIN XL) 300 MG 24 hr tablet TAKE 1 TABLET BY MOUTH DAILY 09/25/20   [name], MD  Cholecalciferol (VITAMIN D-3) 5000 UNITS TABS Take 5,000 Units by mouth every other day.     [provider]  colchicine 0.6 MG tablet Take 1 tablet (0.6 mg total) by mouth daily as needed (for gout). 08/15/20   [name], MD  dicyclomine (BENTYL) 20 MG tablet TAKE 1 TABLET UP TO FOUR TIMES A DAY FOR GI CRAMPING, PAIN, AND NAUSEA/VOMITING AS NEEDED 11/26/20   [name], MD  Dulaglutide (TRULICITY) 3 YQ/6.5HQ SOPN Inject 3 mg as directed once a week. 09/03/20   [name], MD  DULoxetine (CYMBALTA) 60 MG capsule TAKE 1 CAPSULE BY MOUTH ONCE DAILY. 09/25/20   [name], MD   furosemide (LASIX) 20 MG tablet Take 3 tablets (60 mg total) by mouth 2 (two) times daily. 05/13/20   Richardson Dopp T, PA-C  gabapentin (NEURONTIN) 100 MG capsule TAKE 3 CAPSULES BY MOUTH AT BEDTIME. 09/25/20   [name], MD  GLOBAL EASE INJECT PEN NEEDLES 32G X 4 MM MISC USE AS DIRECTED WITH VICTOZA AND TRESIBA. 08/12/20   [name], MD  insulin degludec (TRESIBA FLEXTOUCH) 200 UNIT/ML FlexTouch Pen INJECT 90 UNITS UNDER SKIN ONCE DAILY 11/26/20   [name], MD  ipratropium-albuterol (DUONEB) 0.5-2.5 (3) MG/3ML SOLN Take 3 mLs by nebulization every 6 (six)  hours as needed (wheezing). 05/25/19   [provider]  Iron, Ferrous Sulfate, 325 (65 Fe) MG TABS Take 325 mg by mouth daily. 12/01/20   [name], MD  mesalamine (LIALDA) 1.2 g EC tablet Take 1.2 g by mouth 2 (two) times daily.    [provider]  metolazone (ZAROXOLYN) 5 MG tablet TAKE 1 TABLET BY MOUTH AS NEEDED. 08/19/20   [name], MD  MICROLET LANCETS MISC 1 each by Other route. As directed 07/30/15   [provider]  nystatin (MYCOSTATIN/NYSTOP) powder Apply 1 application topically 3 (three) times daily. 09/26/20   [name], MD  ondansetron (ZOFRAN) 4 MG tablet Take 1 tablet (4 mg total) by mouth every 6 (six) hours. 05/23/20   Faustino Congress, NP  pantoprazole (PROTONIX) 40 MG tablet TAKE 1 TABLET BY MOUTH AT BEDTIME 08/21/20   [name], MD  polyethylene glycol [facility] / Floria Raveling) packet Take 17 g by mouth daily as needed for mild constipation.    [provider]  potassium chloride (MICRO-K) 10 MEQ CR capsule TAKE 5 CAPSULES (50 MEQ TOTAL) BY MOUTH 2 (TWO) TIMES DAILY. 07/03/20   Richardson Dopp T, PA-C  propranolol (INDERAL) 10 MG tablet TAKE 1 TABLET BY MOUTH 2 TIMES DAILY 09/25/20   [name], MD  revefenacin [facility]) 175 MCG/3ML nebulizer solution Take 3 mLs (175 mcg total) by nebulization daily. 05/20/20   [name], MD  rOPINIRole (REQUIP) 4 MG  tablet TAKE 1 TABLET BY MOUTH TWICE DAILY 09/25/20   [name], MD  spironolactone (ALDACTONE) 25 MG tablet TAKE 1 TABLET BY MOUTH DAILY. 11/21/20   [name], MD  traMADol (ULTRAM) 50 MG tablet Take 1 tablet (50 mg total) by mouth 3 (three) times daily as needed. 06/18/20   [name], MD  UNABLE TO FIND CPAP- At bedtime    [provider]    Family History Family History  Problem Relation Age of Onset   Heart disease Mother    Hypertension Mother    Dementia Mother    Heart disease Father    COPD Father    Hypertension Father    Heart disease Brother    Other Brother        knee replacement; hip replacement   Other Brother        cant brathe when laying down   Breast cancer Neg Hx    Colon cancer Neg Hx     Social History Social History   Tobacco Use   Smoking status: Never   Smokeless tobacco: Never  Vaping Use   Vaping Use: Never used  Substance Use Topics   Alcohol use: No   Drug use: No     Allergies   Almond oil, Morphine and related, Atorvastatin, Ceclor [cefaclor], Sulfa antibiotics, Ciprofloxacin, Levaquin [levofloxacin], Losartan, and Statins   Review of Systems Review of Systems Per HPI  Physical Exam Triage Vital Signs ED Triage Vitals  Enc Vitals Group     BP 12/05/20 1227 (!) 151/89     Pulse Rate 12/05/20 1227 77     Resp 12/05/20 1227 18     Temp 12/05/20 1227 98.2 F (36.8 C)     Temp Source 12/05/20 1227 Oral     SpO2 12/05/20 1227 93 %     Weight --      Height --      Head Circumference --      Peak Flow --  Pain Score 12/05/20 1228 5     Pain Loc --      Pain Edu? --      Excl. in [place]? --    No data found.  Updated Vital Signs BP (!) 151/89 (BP Location: Left Arm)   Pulse 77   Temp 98.2 F (36.8 C) (Oral)   Resp 18   SpO2 93%   Visual Acuity Right Eye Distance:   Left Eye Distance:   Bilateral Distance:    Right Eye Near:   Left Eye Near:    Bilateral Near:     Physical  Exam Constitutional:      General: She is not in acute distress.    Appearance: Normal appearance. She is not ill-appearing, toxic-appearing or diaphoretic.  HENT:     Head: Normocephalic and atraumatic.  Eyes:     Extraocular Movements: Extraocular movements intact.     Conjunctiva/sclera: Conjunctivae normal.  Cardiovascular:     Rate and Rhythm: Normal rate and regular rhythm.     Pulses: Normal pulses.     Heart sounds: Normal heart sounds.  Pulmonary:     Effort: Pulmonary effort is normal.     Breath sounds: Normal breath sounds.  Abdominal:     General: Bowel sounds are normal. There is no distension.     Palpations: Abdomen is soft.     Tenderness: There is no abdominal tenderness.  Musculoskeletal:     Cervical back: Normal.     Thoracic back: Normal.     Lumbar back: Normal.     Comments: Tenderness to bilateral lower lumbar regions and extends to sides.  Seems consistent with flank pain.  Skin:    General: Skin is warm and dry.  Neurological:     General: No focal deficit present.     Mental Status: She is alert and oriented to person, place, and time. Mental status is at baseline.  Psychiatric:        Mood and Affect: Mood normal.        Behavior: Behavior normal.        Thought Content: Thought content normal.        Judgment: Judgment normal.     UC Treatments / Results  Labs (all labs ordered are listed, but only abnormal results are displayed) Labs Reviewed  POCT URINALYSIS DIP (MANUAL ENTRY) - Abnormal; Notable for the following components:      Result Value   Clarity, UA cloudy (*)    Spec Grav, UA >=1.030 (*)    Blood, UA small (*)    Protein Ur, POC =30 (*)    Leukocytes, UA Large (3+) (*)    All other components within normal limits  URINE CULTURE    EKG   Radiology No results found.  Procedures Procedures (including critical care time)  Medications Ordered in UC Medications - No data to display  Initial Impression / Assessment and  Plan / UC Course  I have reviewed the triage vital signs and the nursing notes.  Pertinent labs & imaging results that were available during my care of the patient were reviewed by me and considered in my medical decision making (see chart for details).     Urinalysis showing large amounts of leukocytes which is indicative of urinary tract infection.  Will treat with Augmentin antibiotic due to results of recent urine culture as well as recent treatment with cephalexin in August.  Patient also has multiple antibiotic allergies, so Augmentin seems to be best  choice for treatment also given patient's age and comorbidities.  Urine culture is pending.Discussed strict return precautions. Patient verbalized understanding and is agreeable with plan.  Final Clinical Impressions(s) / UC Diagnoses   Final diagnoses:  Acute cystitis with hematuria  Dysuria  Urinary frequency     Discharge Instructions      Your urine did show signs of urinary tract infection.  You are being treated with Augmentin antibiotic.  Urine culture is pending.  We will call if it is positive.  Please go the hospital if symptoms significantly worsen.     ED Prescriptions     Medication Sig Dispense Auth. Provider   amoxicillin-clavulanate (AUGMENTIN) 875-125 MG tablet Take 1 tablet by mouth every 12 (twelve) hours. 14 tablet Odis Luster, FNP      PDMP not reviewed this encounter.   Odis Luster, FNP 12/05/20 [phone]

## 2020-12-05 NOTE — ED Triage Notes (Signed)
Pt c/o dysuria, bilateral flank pains, frequency, oliguria, darker urine. Denies hematuria. Onset last week. States has been taking cranberry juice at home without relief.

## 2020-12-05 NOTE — Discharge Instructions (Signed)
Your urine did show signs of urinary tract infection.  You are being treated with Augmentin antibiotic.  Urine culture is pending.  We will call if it is positive.  Please go the hospital if symptoms significantly worsen.

## 2020-12-06 DIAGNOSIS — E782 Mixed hyperlipidemia: Secondary | ICD-10-CM

## 2020-12-06 DIAGNOSIS — E1165 Type 2 diabetes mellitus with hyperglycemia: Secondary | ICD-10-CM

## 2020-12-06 DIAGNOSIS — I10 Essential (primary) hypertension: Secondary | ICD-10-CM

## 2020-12-07 LAB — URINE CULTURE: Culture: >=100000 — AB

## 2020-12-08 ENCOUNTER — Other Ambulatory Visit: Payer: Self-pay | Admitting: Family Medicine

## 2020-12-08 DIAGNOSIS — E1165 Type 2 diabetes mellitus with hyperglycemia: Secondary | ICD-10-CM

## 2020-12-12 ENCOUNTER — Encounter (HOSPITAL_BASED_OUTPATIENT_CLINIC_OR_DEPARTMENT_OTHER): Payer: Self-pay | Admitting: *Deleted

## 2020-12-12 ENCOUNTER — Telehealth: Payer: Self-pay | Admitting: Podiatry

## 2020-12-12 ENCOUNTER — Emergency Department (HOSPITAL_BASED_OUTPATIENT_CLINIC_OR_DEPARTMENT_OTHER): Payer: Medicare HMO

## 2020-12-12 ENCOUNTER — Telehealth: Payer: Self-pay | Admitting: Family Medicine

## 2020-12-12 ENCOUNTER — Other Ambulatory Visit: Payer: Self-pay

## 2020-12-12 ENCOUNTER — Emergency Department (HOSPITAL_BASED_OUTPATIENT_CLINIC_OR_DEPARTMENT_OTHER)
Admission: EM | Admit: 2020-12-12 | Discharge: 2020-12-12 | Disposition: A | Discharge status: Home / Self Care | Payer: Medicare HMO | Attending: Emergency Medicine | Admitting: Emergency Medicine

## 2020-12-12 DIAGNOSIS — Z743 Need for continuous supervision: Secondary | ICD-10-CM | POA: Diagnosis not present

## 2020-12-12 DIAGNOSIS — I130 Hypertensive heart and chronic kidney disease with heart failure and stage 1 through stage 4 chronic kidney disease, or unspecified chronic kidney disease: Secondary | ICD-10-CM | POA: Diagnosis not present

## 2020-12-12 DIAGNOSIS — J45909 Unspecified asthma, uncomplicated: Secondary | ICD-10-CM | POA: Diagnosis not present

## 2020-12-12 DIAGNOSIS — M25571 Pain in right ankle and joints of right foot: Secondary | ICD-10-CM | POA: Diagnosis not present

## 2020-12-12 DIAGNOSIS — N189 Chronic kidney disease, unspecified: Secondary | ICD-10-CM | POA: Insufficient documentation

## 2020-12-12 DIAGNOSIS — E1122 Type 2 diabetes mellitus with diabetic chronic kidney disease: Secondary | ICD-10-CM | POA: Diagnosis not present

## 2020-12-12 DIAGNOSIS — M1039 Gout due to renal impairment, multiple sites: Secondary | ICD-10-CM | POA: Diagnosis not present

## 2020-12-12 DIAGNOSIS — Z794 Long term (current) use of insulin: Secondary | ICD-10-CM | POA: Diagnosis not present

## 2020-12-12 DIAGNOSIS — I5032 Chronic diastolic (congestive) heart failure: Secondary | ICD-10-CM | POA: Insufficient documentation

## 2020-12-12 DIAGNOSIS — M79671 Pain in right foot: Secondary | ICD-10-CM | POA: Diagnosis present

## 2020-12-12 DIAGNOSIS — J441 Chronic obstructive pulmonary disease with (acute) exacerbation: Secondary | ICD-10-CM | POA: Diagnosis not present

## 2020-12-12 DIAGNOSIS — I10 Essential (primary) hypertension: Secondary | ICD-10-CM | POA: Diagnosis not present

## 2020-12-12 DIAGNOSIS — M10071 Idiopathic gout, right ankle and foot: Secondary | ICD-10-CM | POA: Diagnosis not present

## 2020-12-12 DIAGNOSIS — N390 Urinary tract infection, site not specified: Secondary | ICD-10-CM

## 2020-12-12 DIAGNOSIS — Z79899 Other long term (current) drug therapy: Secondary | ICD-10-CM | POA: Diagnosis not present

## 2020-12-12 DIAGNOSIS — M79673 Pain in unspecified foot: Secondary | ICD-10-CM | POA: Diagnosis not present

## 2020-12-12 DIAGNOSIS — M7989 Other specified soft tissue disorders: Secondary | ICD-10-CM | POA: Diagnosis not present

## 2020-12-12 LAB — CBG MONITORING, ED: Glucose-Capillary: 155 mg/dL — ABNORMAL HIGH (ref 70–99)

## 2020-12-12 MED ORDER — OXYCODONE-ACETAMINOPHEN 5-325 MG PO TABS
1.0000 | ORAL_TABLET | Freq: Once | ORAL | Status: AC
  Administered 2020-12-12: 1 via ORAL
  Filled 2020-12-12: qty 1

## 2020-12-12 MED ORDER — FENTANYL CITRATE PF 50 MCG/ML IJ SOSY
50.0000 ug | PREFILLED_SYRINGE | Freq: Once | INTRAMUSCULAR | Status: AC
Start: 2020-12-12 — End: 2020-12-12
  Administered 2020-12-12: 50 ug via INTRAVENOUS
  Filled 2020-12-12: qty 1

## 2020-12-12 MED ORDER — ONDANSETRON HCL 4 MG/2ML IJ SOLN
4.0000 mg | Freq: Once | INTRAMUSCULAR | Status: AC
  Administered 2020-12-12: 4 mg via INTRAVENOUS
  Filled 2020-12-12: qty 2

## 2020-12-12 MED ORDER — KETOROLAC TROMETHAMINE 15 MG/ML IJ SOLN
15.0000 mg | Freq: Once | INTRAMUSCULAR | Status: AC
  Administered 2020-12-12: 15 mg via INTRAVENOUS
  Filled 2020-12-12: qty 1

## 2020-12-12 MED ORDER — PREDNISONE 20 MG PO TABS: 40.0000 mg | ORAL_TABLET | Freq: Every day | ORAL | 0 refills | Status: DC

## 2020-12-12 MED ORDER — OXYCODONE-ACETAMINOPHEN 5-325 MG PO TABS: 1.0000 | ORAL_TABLET | Freq: Four times a day (QID) | ORAL | 0 refills | Status: DC | PRN

## 2020-12-12 MED ORDER — PREDNISONE 50 MG PO TABS
60.0000 mg | ORAL_TABLET | Freq: Once | ORAL | Status: AC
  Administered 2020-12-12: 60 mg via ORAL
  Filled 2020-12-12: qty 1

## 2020-12-12 NOTE — Telephone Encounter (Signed)
Patient called stating she is a pt of Dr Amalia Hailey and needs a shot because her ankle hurts so bad she can't walk. Let pt know Dr Amalia Hailey is not in office today he is in surgery. Offered pt to look at his schedule for tomorrow but pt wanted to be seen today. Let pt know per the MA Angie here if she feels she cant wait to go to UC or ER. Relayed message to pt.

## 2020-12-12 NOTE — ED Triage Notes (Signed)
Pt arrived via EMS with a complaint of right foot pain x 3 days.  History of gout and arthritis.

## 2020-12-12 NOTE — Telephone Encounter (Signed)
Pt would like a return call to discuss her gout. Pt says its very painful.

## 2020-12-12 NOTE — Telephone Encounter (Signed)
LMTCB

## 2020-12-12 NOTE — ED Provider Notes (Signed)
[facility] EMERGENCY DEPT Provider Note   CSN: [id number] Arrival date & time: 12/12/20  1602     History Chief Complaint  Patient presents with   Foot Pain    Kathleen Hatfield is a 65 y.o. female.  Patient is a 65 year old female with a history of diabetes, CKD, CHF, GERD, hyperlipidemia, hypertension, ulcerative colitis who is presenting today with 2 to 3 days of worsening right foot pain.  It started at her right great toe and has now moved into her ankle as well.  She has been trying to take Tylenol and tramadol at home but reports the pain is excruciating anytime she tries to walk on it.  She does have pain without walking on it but it makes it much worse when trying to walk.  She has noticed some mild swelling and redness to the areas.  She denies any fall or trauma.  She does report that it feels like gout like she has had in the past.  She has not had any fever or systemic symptoms.  She denies any shortness of breath, cough, abdominal pain.  Her last flare of gout was in June.  The history is provided by the patient and the EMS personnel.      Past Medical History:  Diagnosis Date   Anemia    Arthritis    Asthma    Chronic diastolic CHF 72/0947   Echocardiogram 05/2019: EF 70, no RWMA, mild LVH, Gr 1 DD, normal RVSF, severe LVH, borderline asc Aorta (39 mm)   CKD (chronic kidney disease)    Depression    Diabetes mellitus without complication (HCC)    Diverticulitis    Dyspnea    with exertion   GERD (gastroesophageal reflux disease)    History of blood transfusion    Hyperlipidemia    cannot tolerate statins   Hypertension    patient states she has never had high blood pressure.    Nuclear stress test    Myoview 05/2019: EF 83, no ischemia or infarction; Low Risk   Peripheral neuropathy    Pneumonia    PONV (postoperative nausea and vomiting)    RLS (restless legs syndrome)    Sleep apnea    uses CPAP   Ulcerative colitis [facility])    dr Collene Mares     Patient Active Problem List   Diagnosis Date Noted   Rash 09/27/2020   COPD exacerbation ([place]) 09/21/2020   Acute exacerbation of COPD with asthma ([place]) 09/19/2020   Mixed diabetic hyperlipidemia associated with type 2 diabetes mellitus ([place]) 09/19/2020   Pain of foot 06/20/2020   Medicare annual wellness visit, initial 06/20/2020   Constipation 06/11/2020   Diverticulosis of colon 06/11/2020   Irritable bowel syndrome 09/62/8366   Periumbilical pain 29/47/6546   RLS (restless legs syndrome)    Acute asthma exacerbation 01/09/2020   Asthma exacerbation 01/08/2020   Muscle weakness 01/08/2020   Grade I diastolic dysfunction 50/35/4656   Statin myopathy 10/10/2019   Advance care planning 06/14/2019   RLQ abdominal pain 06/14/2019   COPD (chronic obstructive pulmonary disease) ([place]) 06/14/2019   Leukocytosis 06/14/2019   Hypercalcemia 06/14/2019   Gout 06/14/2019   Type 2 diabetes mellitus with hyperglycemia, without long-term current use of insulin (HCC)    COPD with acute exacerbation ([place]) 05/03/2017   Acute renal failure superimposed on stage 2 chronic kidney disease ([place]) 04/02/2016   Diabetes mellitus without complication (HCC)    CKD (chronic kidney disease)    Hypersomnia with  sleep apnea 09/16/2015   Fatigue 06/07/2015   Morbid obesity ([place]) 06/07/2015   Chronic diastolic heart failure (HCC)    Pneumonia of both lower lobes due to infectious organism 02/09/2013   OSA on CPAP 08/23/2012   Essential hypertension    Depression    GERD without esophagitis    Hyperlipidemia    Ulcerative colitis ([place])    Patellar tendinitis 05/25/2011   Knee pain 05/25/2011   Myofascial pain 05/25/2011   Cervicalgia 05/25/2011    Past Surgical History:  Procedure Laterality Date   ABDOMINAL HYSTERECTOMY     APPENDECTOMY     BREAST BIOPSY     BREAST SURGERY     left biopsy   CARDIAC CATHETERIZATION     CESAREAN SECTION     CHOLECYSTECTOMY     HERNIA REPAIR     INSERTION  OF MESH N/A 11/15/2015   Procedure: INSERTION OF MESH;  Surgeon: [name], MD;  Location: [facility] ORS;  Service: General;  Laterality: N/A;   LAPAROSCOPIC LYSIS OF ADHESIONS N/A 11/15/2015   Procedure: LAPAROSCOPIC LYSIS OF ADHESIONS;  Surgeon: [name], MD;  Location: [facility] ORS;  Service: General;  Laterality: N/A;   RIGHT HEART CATHETERIZATION N/A 02/27/2013   Procedure: RIGHT HEART CATH;  Surgeon: Blane Ohara, MD;  Location: [facility] CATH LAB;  Service: Cardiovascular;  Laterality: N/A;   RIGHT HEART CATHETERIZATION N/A 12/14/2013   Procedure: RIGHT HEART CATH;  Surgeon: Larey Dresser, MD;  Location: [facility] CATH LAB;  Service: Cardiovascular;  Laterality: N/A;   SIGMOIDECTOMY  2010   diverticular disease   VENTRAL HERNIA REPAIR N/A 11/15/2015   Procedure: LAPAROSCOPIC VENTRAL WALL HERNIA REPAIR;  Surgeon: [name], MD;  Location: [facility] ORS;  Service: General;  Laterality: N/A;     OB History     Gravida  2   Para  1   Term      Preterm      AB  1   Living         SAB      IAB      Ectopic      Multiple      Live Births              Family History  Problem Relation Age of Onset   Heart disease Mother    Hypertension Mother    Dementia Mother    Heart disease Father    COPD Father    Hypertension Father    Heart disease Brother    Other Brother        knee replacement; hip replacement   Other Brother        cant brathe when laying down   Breast cancer Neg Hx    Colon cancer Neg Hx     Social History   Tobacco Use   Smoking status: Never   Smokeless tobacco: Never  Vaping Use   Vaping Use: Never used  Substance Use Topics   Alcohol use: No   Drug use: No    Home Medications Prior to Admission medications   Medication Sig Start Date End Date Taking? Authorizing Provider  albuterol (VENTOLIN HFA) 108 (90 Base) MCG/ACT inhaler Inhale 2 puffs into the lungs every 6 (six) hours as needed. 02/16/20  Yes [name], MD  amoxicillin-clavulanate  (AUGMENTIN) 875-125 MG tablet Take 1 tablet by mouth every 12 (twelve) hours. 12/05/20  Yes Odis Luster, FNP  buPROPion (WELLBUTRIN XL) 300 MG 24 hr tablet TAKE  1 TABLET BY MOUTH DAILY 09/25/20  Yes [name], MD  Cholecalciferol (VITAMIN D-3) 5000 UNITS TABS Take 5,000 Units by mouth every other day.    Yes [provider]  colchicine 0.6 MG tablet Take 1 tablet (0.6 mg total) by mouth daily as needed (for gout). 08/15/20  Yes [name], MD  mesalamine (LIALDA) 1.2 g EC tablet Take 1.2 g by mouth 2 (two) times daily.   Yes [provider]  oxyCODONE-acetaminophen (PERCOCET/ROXICET) 5-325 MG tablet Take 1 tablet by mouth every 6 (six) hours as needed for severe pain. 12/12/20  Yes Durenda Pechacek, Loree Fee, MD  pantoprazole (PROTONIX) 40 MG tablet TAKE 1 TABLET BY MOUTH AT BEDTIME 08/21/20  Yes [name], MD  potassium chloride (MICRO-K) 10 MEQ CR capsule TAKE 5 CAPSULES (50 MEQ TOTAL) BY MOUTH 2 (TWO) TIMES DAILY. 07/03/20  Yes Weaver, Scott T, PA-C  predniSONE (DELTASONE) 20 MG tablet Take 2 tablets (40 mg total) by mouth daily. 12/12/20  Yes Rodd Heft, Loree Fee, MD  rOPINIRole (REQUIP) 4 MG tablet TAKE 1 TABLET BY MOUTH TWICE DAILY 09/25/20  Yes [name], MD  spironolactone (ALDACTONE) 25 MG tablet TAKE 1 TABLET BY MOUTH DAILY. 11/21/20  Yes [name], MD  traMADol (ULTRAM) 50 MG tablet Take 1 tablet (50 mg total) by mouth 3 (three) times daily as needed. 06/18/20  Yes [name], MD  acetaminophen (TYLENOL) 500 MG tablet Take 500 mg by mouth every 6 (six) hours as needed for moderate pain.    [provider]  allopurinol (ZYLOPRIM) 100 MG tablet TAKE 1/2 TABLET BY MOUTH DAILY 11/15/20   [name], MD  BAYER CONTOUR NEXT TEST test strip 1 each by Other route daily. As directed 07/30/15   [provider]  budesonide (PULMICORT) 0.5 MG/2ML nebulizer solution Take 2 mLs (0.5 mg total) by nebulization 2 (two) times daily. 05/23/20   [name], MD  dicyclomine (BENTYL) 20 MG tablet TAKE 1 TABLET UP TO FOUR TIMES A DAY FOR GI CRAMPING, PAIN, AND NAUSEA/VOMITING AS NEEDED 11/26/20   [name], MD  Dulaglutide (TRULICITY) 3 EB/3.4DH SOPN Inject 3 mg as directed once a week. 09/03/20   [name], MD  DULoxetine (CYMBALTA) 60 MG capsule TAKE 1 CAPSULE BY MOUTH ONCE DAILY. 09/25/20   [name], MD  furosemide (LASIX) 20 MG tablet Take 3 tablets (60 mg total) by mouth 2 (two) times daily. 05/13/20   Richardson Dopp T, PA-C  gabapentin (NEURONTIN) 100 MG capsule TAKE 3 CAPSULES BY MOUTH AT BEDTIME. 09/25/20   [name], MD  GLOBAL EASE INJECT PEN NEEDLES 32G X 4 MM MISC USE AS DIRECTED WITH VICTOZA AND TRESIBA. 08/12/20   [name], MD  insulin degludec (TRESIBA FLEXTOUCH) 200 UNIT/ML FlexTouch Pen INJECT 90 UNITS UNDER SKIN ONCE DAILY 11/26/20   [name], MD  ipratropium-albuterol (DUONEB) 0.5-2.5 (3) MG/3ML SOLN Take 3 mLs by nebulization every 6 (six) hours as needed (wheezing). 05/25/19   [provider]  Iron, Ferrous Sulfate, 325 (65 Fe) MG TABS Take 325 mg by mouth daily. 12/01/20   [name], MD  metolazone (ZAROXOLYN) 5 MG tablet TAKE 1 TABLET BY MOUTH AS NEEDED. 08/19/20   [name], MD  MICROLET LANCETS MISC 1 each by Other route. As directed 07/30/15   [provider]  nystatin (MYCOSTATIN/NYSTOP) powder Apply 1 application topically 3 (three) times daily. 09/26/20   [name], MD  ondansetron (ZOFRAN) 4 MG tablet Take 1 tablet (4 mg total) by mouth every 6 (six) hours. 05/23/20   Faustino Congress, NP  polyethylene glycol (MIRALAX / Floria Raveling) packet Take 17 g by mouth daily as needed for mild constipation.    [provider]  propranolol (INDERAL) 10 MG tablet TAKE 1 TABLET BY MOUTH 2 TIMES DAILY 09/25/20   [name], MD  revefenacin [facility]) 175 MCG/3ML nebulizer solution Take 3 mLs (175 mcg total) by nebulization daily. 05/20/20   [name],  MD  UNABLE TO FIND CPAP- At bedtime    [provider]    Allergies    Almond oil, Morphine and related, Atorvastatin, Ceclor [cefaclor], Sulfa antibiotics, Ciprofloxacin, Levaquin [levofloxacin], Losartan, and Statins  Review of Systems   Review of Systems  All other systems reviewed and are negative.  Physical Exam Updated Vital Signs BP 119/61   Pulse 77   Resp 20   Ht 5' 5"  (1.651 m)   Wt 124.3 kg   SpO2 97%   BMI 45.60 kg/m   Physical Exam Vitals and nursing note reviewed.  Constitutional:      General: She is not in acute distress.    Appearance: She is well-developed.  HENT:     Head: Normocephalic and atraumatic.  Eyes:     Conjunctiva/sclera: Conjunctivae normal.     Pupils: Pupils are equal, round, and reactive to light.  Cardiovascular:     Rate and Rhythm: Normal rate and regular rhythm.     Heart sounds: No murmur heard. Pulmonary:     Effort: Pulmonary effort is normal. No respiratory distress.     Breath sounds: Normal breath sounds. No wheezing or rales.  Musculoskeletal:        General: Tenderness present. Normal range of motion.     Cervical back: Normal range of motion and neck supple.       Feet:     Comments: 2+ right DP pulses.  Nonpitting edema in the right foot  Skin:    General: Skin is warm and dry.     Capillary Refill: Capillary refill takes less than 2 seconds.     Findings: No erythema or rash.  Neurological:     Mental Status: She is alert and oriented to person, place, and time. Mental status is at baseline.  Psychiatric:        Mood and Affect: Mood normal.        Behavior: Behavior normal.    ED Results / Procedures / Treatments   Labs (all labs ordered are listed, but only abnormal results are displayed) Labs Reviewed  CBG MONITORING, ED - Abnormal; Notable for the following components:      Result Value   Glucose-Capillary 155 (*)    All other components within normal limits    EKG None  Radiology DG  Ankle Complete Right  Result Date: 12/12/2020 CLINICAL DATA:  Foot pain ankle pain EXAM: RIGHT ANKLE - COMPLETE 3+ VIEW COMPARISON:  None. FINDINGS: No fracture or malalignment. Diffuse soft tissue swelling. Degenerative changes medially. IMPRESSION: Soft tissue swelling without acute osseous abnormality. Electronically Signed   By: Donavan Foil M.D.   On: 12/12/2020 17:56   DG Foot Complete Right  Result Date: 12/12/2020 CLINICAL DATA:  Foot pain EXAM: RIGHT FOOT COMPLETE - 3+ VIEW COMPARISON:  06/18/2020 FINDINGS: No fracture or malalignment. Mild degenerative change at the first MTP joint. Soft tissue swelling adjacent to the head of the first metatarsal. IMPRESSION: No acute  osseous abnormality. Electronically Signed   By: Donavan Foil M.D.   On: 12/12/2020 17:57    Procedures Procedures   Medications Ordered in ED Medications  fentaNYL (SUBLIMAZE) injection 50 mcg (50 mcg Intravenous Given 12/12/20 1712)  ondansetron (ZOFRAN) injection 4 mg (4 mg Intravenous Given 12/12/20 1714)  oxyCODONE-acetaminophen (PERCOCET/ROXICET) 5-325 MG per tablet 1 tablet (1 tablet Oral Given 12/12/20 1750)  ketorolac (TORADOL) 15 MG/ML injection 15 mg (15 mg Intravenous Given 12/12/20 1848)  predniSONE (DELTASONE) tablet 60 mg (60 mg Oral Given 12/12/20 1848)    ED Course  I have reviewed the triage vital signs and the nursing notes.  Pertinent labs & imaging results that were available during my care of the patient were reviewed by me and considered in my medical decision making (see chart for details).    MDM Rules/Calculators/A&P                           Patient with evidence of gout in the right great toe and ankle.  Patient does have prior history of gout and has been on colchicine in the past.  She does have a history of chronic kidney disease and last creatinine 2 weeks ago was 1.4.  Low suspicion for septic joint at this time.  No systemic symptoms.  Will give pain control.  Plain films pending  but patient denies trauma.   7:55 PM Itching is normal.  On repeat evaluation after fentanyl patient is still having pain.  She was given 1 dose of Toradol and prednisone with significant improvement in her pain.  Will discharge home to continue prednisone due to her heart disease and chronic kidney disease do not feel she is a great candidate for NSAIDs.  She will follow-up with her podiatrist in the next few days as well.  MDM   Amount and/or Complexity of Data Reviewed Tests in the radiology section of CPT: ordered and reviewed Independent visualization of images, tracings, or specimens: yes    Final Clinical Impression(s) / ED Diagnoses Final diagnoses:  Acute gout due to renal impairment involving multiple sites    Rx / DC Orders ED Discharge Orders          Ordered    oxyCODONE-acetaminophen (PERCOCET/ROXICET) 5-325 MG tablet  Every 6 hours PRN        12/12/20 1953    predniSONE (DELTASONE) 20 MG tablet  Daily        12/12/20 1953             Blanchie Dessert, MD 12/12/20 1955

## 2020-12-14 ENCOUNTER — Other Ambulatory Visit: Payer: Self-pay | Admitting: Physician Assistant

## 2020-12-14 DIAGNOSIS — J449 Chronic obstructive pulmonary disease, unspecified: Secondary | ICD-10-CM | POA: Diagnosis not present

## 2020-12-19 ENCOUNTER — Telehealth: Payer: Self-pay

## 2020-12-19 DIAGNOSIS — G4733 Obstructive sleep apnea (adult) (pediatric): Secondary | ICD-10-CM | POA: Diagnosis not present

## 2020-12-19 NOTE — Chronic Care Management (AMB) (Addendum)
Chronic Care Management Pharmacy Assistant   Name: Kathleen Hatfield  MRN: [id number] DOB: 02/27/1956  Reason for Encounter:  Diabetes Disease State   Recent office visits:  11/28/20-PCP-Patient presented for edema. Labs ordered, stop Meloxicam for now. A1C elevation needs referral to Endocrinology. Her fluid test/BNP is normal which suggest she should not need metolazone frequently at this point. Iron levels low - start iron.  Recent consult visits:  None since last CCM contact  Hospital visits:  12/12/20 - [facility] ED - Patient presented for right foot acute gout flare up. Labs ordered. Discharged with short course of prednisone 60 mg take 1 a day, zofran take 1 tablet every 6 hours as needed, and Percocet 5/325 mg take 1 tablet every 6 hours as needed. Follow up with podiatry. No admission. 12/05/20 - [facility] - Patient presented for UTI. UA ordered. Start Augmentin 875-186m take 1 tablet every 12 hours. No admission.  Medications: Outpatient Encounter Medications as of 12/19/2020  Medication Sig   acetaminophen (TYLENOL) 500 MG tablet Take 500 mg by mouth every 6 (six) hours as needed for moderate pain.   albuterol (VENTOLIN HFA) 108 (90 Base) MCG/ACT inhaler Inhale 2 puffs into the lungs every 6 (six) hours as needed.   allopurinol (ZYLOPRIM) 100 MG tablet TAKE 1/2 TABLET BY MOUTH DAILY   amoxicillin-clavulanate (AUGMENTIN) 875-125 MG tablet Take 1 tablet by mouth every 12 (twelve) hours.   BAYER CONTOUR NEXT TEST test strip 1 each by Other route daily. As directed   budesonide (PULMICORT) 0.5 MG/2ML nebulizer solution Take 2 mLs (0.5 mg total) by nebulization 2 (two) times daily.   buPROPion (WELLBUTRIN XL) 300 MG 24 hr tablet TAKE 1 TABLET BY MOUTH DAILY   Cholecalciferol (VITAMIN D-3) 5000 UNITS TABS Take 5,000 Units by mouth every other day.    colchicine 0.6 MG tablet Take 1 tablet (0.6 mg total) by mouth daily as needed (for gout).   dicyclomine (BENTYL)  20 MG tablet TAKE 1 TABLET UP TO FOUR TIMES A DAY FOR GI CRAMPING, PAIN, AND NAUSEA/VOMITING AS NEEDED   Dulaglutide (TRULICITY) 3 MMH/9.6QISOPN Inject 3 mg as directed once a week.   DULoxetine (CYMBALTA) 60 MG capsule TAKE 1 CAPSULE BY MOUTH ONCE DAILY.   furosemide (LASIX) 20 MG tablet Take 3 tablets (60 mg total) by mouth 2 (two) times daily.   gabapentin (NEURONTIN) 100 MG capsule TAKE 3 CAPSULES BY MOUTH AT BEDTIME.   GLOBAL EASE INJECT PEN NEEDLES 32G X 4 MM MISC USE AS DIRECTED WITH VICTOZA AND TRESIBA.   insulin degludec (TRESIBA FLEXTOUCH) 200 UNIT/ML FlexTouch Pen INJECT 90 UNITS UNDER SKIN ONCE DAILY   ipratropium-albuterol (DUONEB) 0.5-2.5 (3) MG/3ML SOLN Take 3 mLs by nebulization every 6 (six) hours as needed (wheezing).   Iron, Ferrous Sulfate, 325 (65 Fe) MG TABS Take 325 mg by mouth daily.   mesalamine (LIALDA) 1.2 g EC tablet Take 1.2 g by mouth 2 (two) times daily.   metolazone (ZAROXOLYN) 5 MG tablet TAKE 1 TABLET BY MOUTH AS NEEDED.   MICROLET LANCETS MISC 1 each by Other route. As directed   nystatin (MYCOSTATIN/NYSTOP) powder Apply 1 application topically 3 (three) times daily.   ondansetron (ZOFRAN) 4 MG tablet Take 1 tablet (4 mg total) by mouth every 6 (six) hours.   oxyCODONE-acetaminophen (PERCOCET/ROXICET) 5-325 MG tablet Take 1 tablet by mouth every 6 (six) hours as needed for severe pain.   pantoprazole (PROTONIX) 40 MG tablet TAKE 1  TABLET BY MOUTH AT BEDTIME   polyethylene glycol (MIRALAX / GLYCOLAX) packet Take 17 g by mouth daily as needed for mild constipation.   potassium chloride (MICRO-K) 10 MEQ CR capsule TAKE 5 CAPSULES (50 MEQ TOTAL) BY MOUTH 2 (TWO) TIMES DAILY   predniSONE (DELTASONE) 20 MG tablet Take 2 tablets (40 mg total) by mouth daily.   propranolol (INDERAL) 10 MG tablet TAKE 1 TABLET BY MOUTH 2 TIMES DAILY   revefenacin (YUPELRI) 175 MCG/3ML nebulizer solution Take 3 mLs (175 mcg total) by nebulization daily.   rOPINIRole (REQUIP) 4 MG tablet  TAKE 1 TABLET BY MOUTH TWICE DAILY   spironolactone (ALDACTONE) 25 MG tablet TAKE 1 TABLET BY MOUTH DAILY.   traMADol (ULTRAM) 50 MG tablet Take 1 tablet (50 mg total) by mouth 3 (three) times daily as needed.   UNABLE TO FIND CPAP- At bedtime   No facility-administered encounter medications on file as of 12/19/2020.     Recent Relevant Labs: Lab Results  Component Value Date/Time   HGBA1C 9.4 (H) 11/28/2020 03:56 PM   HGBA1C 8.5 (H) 09/20/2020 01:14 AM    Kidney Function Lab Results  Component Value Date/Time   CREATININE 1.44 (H) 11/28/2020 03:56 PM   CREATININE 1.48 (H) 09/26/2020 03:18 PM   CREATININE 1.16 09/04/2019 12:00 AM   CREATININE 1.3 04/26/2019 12:00 AM   GFR 38.07 (L) 11/28/2020 03:56 PM   GFRNONAA 43 (L) 09/22/2020 03:40 AM   GFRAA 69 01/19/2020 01:56 PM     Contacted patient on 12/23/20 to discuss diabetes disease state.   Current antihyperglycemic regimen:  Trulicity 3 mg - Inject once weekly (thursdays or fridays) Tyler Aas - 90 units daily (mid-day around 1- 4 PM)    Patient verbally confirms she is taking the above medications as directed. Yes  What diet changes have been made to improve diabetes control? The patient reports she is staying away from high fructose corn syrup additives in food.  What recent interventions/DTPs have been made to improve glycemic control:   Increase Trulicity 4.5 mg once weekly (11/18/20)  The patient reports she has not increased her Trulicity. She reports Dr.Duncan is referring her to a kidney specialist and she has not heard about any appointment. The patient states if her BG's are above 120 she makes adjustments.  Have there been any recent hospitalizations or ED visits since last visit with CPP?  Yes - gout flare  Patient denies hypoglycemic symptoms, including Pale, Sweaty, Shaky, Hungry, Nervous/irritable, and Vision changes  Patient denies hyperglycemic symptoms, including blurry vision, excessive thirst, fatigue,  polyuria, and weakness  How often are you checking your blood sugar?  Not lately due to other issues with her gout episode  What are your blood sugars ranging? The patient reports BG have been less than 150 for the month   During the week, how often does your blood glucose drop below 70? Never  Are you checking your feet daily/regularly? Yes  Adherence Review: Is the patient currently on a STATIN medication? No Is the patient currently on ACE/ARB medication? No Does the patient have >5 day gap between last estimated fill dates? No  Care Gaps: Annual wellness visit in last year? Yes  06/20/20 Most recent A1C reading:  9.4  11/28/20 Most Recent BP reading:  119/61  77-P   12/12/20   Last eye exam / retinopathy screening: 12/21/20 Last diabetic foot exam: 11/28/20  Counseled patient on importance of annual eye and foot exam.   Star Rating Drugs:  Medication:  Last Fill: Day Supply Tresiba 200unit/ml      12/12/20              20 Trulicity 2ZD/6.6         12/16/20            28  No appointments scheduled within the next 30 days.  Debbora Dus, CPP notified  [name], [place] Assistant [phone]  I have reviewed the care management and care coordination activities outlined in this encounter and I am certifying that I agree with the content of this note. No further action required.  Debbora Dus, PharmD Clinical Pharmacist [facility] [phone]

## 2020-12-20 ENCOUNTER — Other Ambulatory Visit: Payer: Self-pay | Admitting: Podiatry

## 2020-12-20 NOTE — Telephone Encounter (Signed)
LMTCB

## 2020-12-24 ENCOUNTER — Other Ambulatory Visit: Payer: Self-pay

## 2020-12-24 ENCOUNTER — Ambulatory Visit: Payer: Medicare HMO | Admitting: Family Medicine

## 2020-12-24 ENCOUNTER — Ambulatory Visit
Admission: EM | Admit: 2020-12-24 | Discharge: 2020-12-24 | Disposition: A | Discharge status: Home / Self Care | Payer: Medicare HMO | Attending: Internal Medicine | Admitting: Internal Medicine

## 2020-12-24 DIAGNOSIS — R300 Dysuria: Secondary | ICD-10-CM | POA: Diagnosis not present

## 2020-12-24 DIAGNOSIS — R350 Frequency of micturition: Secondary | ICD-10-CM | POA: Insufficient documentation

## 2020-12-24 DIAGNOSIS — N3001 Acute cystitis with hematuria: Secondary | ICD-10-CM | POA: Insufficient documentation

## 2020-12-24 LAB — POCT URINALYSIS DIP (MANUAL ENTRY)
Bilirubin, UA: NEGATIVE
Glucose, UA: NEGATIVE mg/dL
Nitrite, UA: NEGATIVE
Protein Ur, POC: 100 mg/dL — AB
Spec Grav, UA: >=1.03 — AB (ref 1.010–1.025)
Urobilinogen, UA: 0.2 E.U./dL
pH, UA: 5.5 (ref 5.0–8.0)

## 2020-12-24 MED ORDER — CEPHALEXIN 500 MG PO CAPS: 500.0000 mg | ORAL_CAPSULE | Freq: Two times a day (BID) | ORAL | 0 refills | Status: AC

## 2020-12-24 NOTE — ED Triage Notes (Signed)
4-5 day h/o dysuria and urinary urgency. Confirms one episode of hematuria. Has taken OTC cranberry supplements with some relief.

## 2020-12-24 NOTE — Discharge Instructions (Addendum)
You have been prescribed an antibiotic to treat urinary tract infection.  Please go to the hospital if symptoms worsen.  Urine culture is pending.  We will call with results.  Please follow-up with urologist soon as possible.

## 2020-12-24 NOTE — ED Provider Notes (Signed)
[facility]    CSN: [id number] Arrival date & time: 12/24/20  1021      History   Chief Complaint Chief Complaint  Patient presents with   Dysuria   Hematuria    HPI Kathleen Hatfield is a 65 y.o. female.   Patient presents with 4 to 5-day history of urinary burning and urinary frequency.  Patient had 1 episode of hematuria this morning with small amount of blood.  Denies any irregular vaginal bleeding, abdominal pain, fever, chills.  Does have some lower bilateral back pain.  Patient was seen in August for UTI and was treated with cephalexin antibiotic.  Patient was then seen in September for UTI and was treated with Augmentin antibiotic.  Patient reports that her symptoms have mainly resolved and these are new symptoms.  Patient was referred to urology by PCP and is awaiting an appointment to be set up.   Dysuria Hematuria   Past Medical History:  Diagnosis Date   Anemia    Arthritis    Asthma    Chronic diastolic CHF 59/1638   Echocardiogram 05/2019: EF 70, no RWMA, mild LVH, Gr 1 DD, normal RVSF, severe LVH, borderline asc Aorta (39 mm)   CKD (chronic kidney disease)    Depression    Diabetes mellitus without complication (HCC)    Diverticulitis    Dyspnea    with exertion   GERD (gastroesophageal reflux disease)    History of blood transfusion    Hyperlipidemia    cannot tolerate statins   Hypertension    patient states she has never had high blood pressure.    Nuclear stress test    Myoview 05/2019: EF 83, no ischemia or infarction; Low Risk   Peripheral neuropathy    Pneumonia    PONV (postoperative nausea and vomiting)    RLS (restless legs syndrome)    Sleep apnea    uses CPAP   Ulcerative colitis ([place])    dr Collene Mares    Patient Active Problem List   Diagnosis Date Noted   Rash 09/27/2020   COPD exacerbation ([place]) 09/21/2020   Acute exacerbation of COPD with asthma ([place]) 09/19/2020   Mixed diabetic hyperlipidemia associated with type 2  diabetes mellitus ([place]) 09/19/2020   Pain of foot 06/20/2020   Medicare annual wellness visit, initial 06/20/2020   Constipation 06/11/2020   Diverticulosis of colon 06/11/2020   Irritable bowel syndrome 46/65/9935   Periumbilical pain 70/17/7939   RLS (restless legs syndrome)    Acute asthma exacerbation 01/09/2020   Asthma exacerbation 01/08/2020   Muscle weakness 01/08/2020   Grade I diastolic dysfunction 03/00/9233   Statin myopathy 10/10/2019   Advance care planning 06/14/2019   RLQ abdominal pain 06/14/2019   COPD (chronic obstructive pulmonary disease) ([place]) 06/14/2019   Leukocytosis 06/14/2019   Hypercalcemia 06/14/2019   Gout 06/14/2019   Type 2 diabetes mellitus with hyperglycemia, without long-term current use of insulin (HCC)    COPD with acute exacerbation ([place]) 05/03/2017   Acute renal failure superimposed on stage 2 chronic kidney disease ([place]) 04/02/2016   Diabetes mellitus without complication (HCC)    CKD (chronic kidney disease)    Hypersomnia with sleep apnea 09/16/2015   Fatigue 06/07/2015   Morbid obesity ([place]) 06/07/2015   Chronic diastolic heart failure (HCC)    Pneumonia of both lower lobes due to infectious organism 02/09/2013   OSA on CPAP 08/23/2012   Essential hypertension    Depression    GERD without esophagitis  Hyperlipidemia    Ulcerative colitis ([place])    Patellar tendinitis 05/25/2011   Knee pain 05/25/2011   Myofascial pain 05/25/2011   Cervicalgia 05/25/2011    Past Surgical History:  Procedure Laterality Date   ABDOMINAL HYSTERECTOMY     APPENDECTOMY     BREAST BIOPSY     BREAST SURGERY     left biopsy   CARDIAC CATHETERIZATION     CESAREAN SECTION     CHOLECYSTECTOMY     HERNIA REPAIR     INSERTION OF MESH N/A 11/15/2015   Procedure: INSERTION OF MESH;  Surgeon: [name], MD;  Location: [facility];  Service: General;  Laterality: N/A;   LAPAROSCOPIC LYSIS OF ADHESIONS N/A 11/15/2015   Procedure: LAPAROSCOPIC LYSIS OF  ADHESIONS;  Surgeon: [name], MD;  Location: [facility];  Service: General;  Laterality: N/A;   RIGHT HEART CATHETERIZATION N/A 02/27/2013   Procedure: RIGHT HEART CATH;  Surgeon: Blane Ohara, MD;  Location: [facility] CATH LAB;  Service: Cardiovascular;  Laterality: N/A;   RIGHT HEART CATHETERIZATION N/A 12/14/2013   Procedure: RIGHT HEART CATH;  Surgeon: Larey Dresser, MD;  Location: [facility] CATH LAB;  Service: Cardiovascular;  Laterality: N/A;   SIGMOIDECTOMY  2010   diverticular disease   VENTRAL HERNIA REPAIR N/A 11/15/2015   Procedure: LAPAROSCOPIC VENTRAL WALL HERNIA REPAIR;  Surgeon: [name], MD;  Location: [facility];  Service: General;  Laterality: N/A;    OB History     Gravida  2   Para  1   Term      Preterm      AB  1   Living         SAB      IAB      Ectopic      Multiple      Live Births               Home Medications    Prior to Admission medications   Medication Sig Start Date End Date Taking? Authorizing Provider  cephALEXin (KEFLEX) 500 MG capsule Take 1 capsule (500 mg total) by mouth 2 (two) times daily for 10 days. 12/24/20 01/03/21 Yes Odis Luster, FNP  acetaminophen (TYLENOL) 500 MG tablet Take 500 mg by mouth every 6 (six) hours as needed for moderate pain.    [provider]  albuterol (VENTOLIN HFA) 108 (90 Base) MCG/ACT inhaler Inhale 2 puffs into the lungs every 6 (six) hours as needed. 02/16/20   [name], MD  allopurinol (ZYLOPRIM) 100 MG tablet TAKE 1/2 TABLET BY MOUTH DAILY 11/15/20   [name], MD  amoxicillin-clavulanate (AUGMENTIN) 875-125 MG tablet Take 1 tablet by mouth every 12 (twelve) hours. 12/05/20   Odis Luster, FNP  BAYER CONTOUR NEXT TEST test strip 1 each by Other route daily. As directed 07/30/15   [provider]  budesonide (PULMICORT) 0.5 MG/2ML nebulizer solution Take 2 mLs (0.5 mg total) by nebulization 2 (two) times daily. 05/23/20   [name], MD  buPROPion (WELLBUTRIN XL)  300 MG 24 hr tablet TAKE 1 TABLET BY MOUTH DAILY 09/25/20   [name], MD  Cholecalciferol (VITAMIN D-3) 5000 UNITS TABS Take 5,000 Units by mouth every other day.     [provider]  colchicine 0.6 MG tablet Take 1 tablet (0.6 mg total) by mouth daily as needed (for gout). 08/15/20   [name], MD  dicyclomine (BENTYL) 20 MG tablet TAKE 1 TABLET UP TO  FOUR TIMES A DAY FOR GI CRAMPING, PAIN, AND NAUSEA/VOMITING AS NEEDED 11/26/20   [name], MD  Dulaglutide (TRULICITY) 3 KL/4.9ZP SOPN Inject 3 mg as directed once a week. 09/03/20   [name], MD  DULoxetine (CYMBALTA) 60 MG capsule TAKE 1 CAPSULE BY MOUTH ONCE DAILY. 09/25/20   [name], MD  furosemide (LASIX) 20 MG tablet Take 3 tablets (60 mg total) by mouth 2 (two) times daily. 05/13/20   Richardson Dopp T, PA-C  gabapentin (NEURONTIN) 100 MG capsule TAKE 3 CAPSULES BY MOUTH AT BEDTIME. 09/25/20   [name], MD  GLOBAL EASE INJECT PEN NEEDLES 32G X 4 MM MISC USE AS DIRECTED WITH VICTOZA AND TRESIBA. 08/12/20   [name], MD  insulin degludec (TRESIBA FLEXTOUCH) 200 UNIT/ML FlexTouch Pen INJECT 90 UNITS UNDER SKIN ONCE DAILY 11/26/20   [name], MD  ipratropium-albuterol (DUONEB) 0.5-2.5 (3) MG/3ML SOLN Take 3 mLs by nebulization every 6 (six) hours as needed (wheezing). 05/25/19   [provider]  Iron, Ferrous Sulfate, 325 (65 Fe) MG TABS Take 325 mg by mouth daily. 12/01/20   [name], MD  mesalamine (LIALDA) 1.2 g EC tablet Take 1.2 g by mouth 2 (two) times daily.    [provider]  metolazone (ZAROXOLYN) 5 MG tablet TAKE 1 TABLET BY MOUTH AS NEEDED. 08/19/20   [name], MD  MICROLET LANCETS MISC 1 each by Other route. As directed 07/30/15   [provider]  nystatin (MYCOSTATIN/NYSTOP) powder Apply 1 application topically 3 (three) times daily. 09/26/20   [name], MD  ondansetron (ZOFRAN) 4 MG tablet Take 1 tablet (4 mg total) by mouth every  6 (six) hours. 05/23/20   Faustino Congress, NP  oxyCODONE-acetaminophen (PERCOCET/ROXICET) 5-325 MG tablet Take 1 tablet by mouth every 6 (six) hours as needed for severe pain. 12/12/20   Blanchie Dessert, MD  pantoprazole (PROTONIX) 40 MG tablet TAKE 1 TABLET BY MOUTH AT BEDTIME 08/21/20   [name], MD  polyethylene glycol [facility] Floria Raveling) packet Take 17 g by mouth daily as needed for mild constipation.    [provider]  potassium chloride (MICRO-K) 10 MEQ CR capsule TAKE 5 CAPSULES (50 MEQ TOTAL) BY MOUTH 2 (TWO) TIMES DAILY 12/16/20   Richardson Dopp T, PA-C  predniSONE (DELTASONE) 20 MG tablet Take 2 tablets (40 mg total) by mouth daily. 12/12/20   Blanchie Dessert, MD  propranolol (INDERAL) 10 MG tablet TAKE 1 TABLET BY MOUTH 2 TIMES DAILY 09/25/20   [name], MD  revefenacin [facility]) 175 MCG/3ML nebulizer solution Take 3 mLs (175 mcg total) by nebulization daily. 05/20/20   [name], MD  rOPINIRole (REQUIP) 4 MG tablet TAKE 1 TABLET BY MOUTH TWICE DAILY 09/25/20   [name], MD  spironolactone (ALDACTONE) 25 MG tablet TAKE 1 TABLET BY MOUTH DAILY. 11/21/20   [name], MD  traMADol (ULTRAM) 50 MG tablet Take 1 tablet (50 mg total) by mouth 3 (three) times daily as needed. 06/18/20   [name], MD  UNABLE TO FIND CPAP- At bedtime    [provider]    Family History Family History  Problem Relation Age of Onset   Heart disease Mother    Hypertension Mother    Dementia Mother    Heart disease Father    COPD Father    Hypertension Father    Heart disease Brother    Other Brother  knee replacement; hip replacement   Other Brother        cant brathe when laying down   Breast cancer Neg Hx    Colon cancer Neg Hx     Social History Social History   Tobacco Use   Smoking status: Never   Smokeless tobacco: Never  Vaping Use   Vaping Use: Never used  Substance Use Topics   Alcohol use: No   Drug use: No      Allergies   Almond oil, Morphine and related, Atorvastatin, Ceclor [cefaclor], Sulfa antibiotics, Ciprofloxacin, Levaquin [levofloxacin], Losartan, and Statins   Review of Systems Review of Systems Per HPI  Physical Exam Triage Vital Signs ED Triage Vitals  Enc Vitals Group     BP 12/24/20 1059 (!) 151/85     Pulse Rate 12/24/20 1059 90     Resp 12/24/20 1059 18     Temp 12/24/20 1059 98.4 F (36.9 C)     Temp Source 12/24/20 1059 Oral     SpO2 12/24/20 1059 93 %     Weight --      Height --      Head Circumference --      Peak Flow --      Pain Score 12/24/20 1101 7     Pain Loc --      Pain Edu? --      Excl. in [place]? --    No data found.  Updated Vital Signs BP (!) 151/85 (BP Location: Left Arm)   Pulse 90   Temp 98.4 F (36.9 C) (Oral)   Resp 18   SpO2 93%   Visual Acuity Right Eye Distance:   Left Eye Distance:   Bilateral Distance:    Right Eye Near:   Left Eye Near:    Bilateral Near:     Physical Exam Constitutional:      Appearance: Normal appearance.  HENT:     Head: Normocephalic and atraumatic.  Eyes:     Extraocular Movements: Extraocular movements intact.     Conjunctiva/sclera: Conjunctivae normal.  Cardiovascular:     Rate and Rhythm: Normal rate and regular rhythm.     Pulses: Normal pulses.     Heart sounds: Normal heart sounds.  Pulmonary:     Effort: Pulmonary effort is normal.     Breath sounds: Normal breath sounds.  Abdominal:     General: Bowel sounds are normal. There is no distension.     Palpations: Abdomen is soft.     Tenderness: There is no abdominal tenderness.  Skin:    General: Skin is warm.  Neurological:     General: No focal deficit present.     Mental Status: She is alert and oriented to person, place, and time. Mental status is at baseline.  Psychiatric:        Mood and Affect: Mood normal.        Behavior: Behavior normal.        Thought Content: Thought content normal.        Judgment: Judgment  normal.     UC Treatments / Results  Labs (all labs ordered are listed, but only abnormal results are displayed) Labs Reviewed  POCT URINALYSIS DIP (MANUAL ENTRY) - Abnormal; Notable for the following components:      Result Value   Clarity, UA cloudy (*)    Ketones, POC UA trace (5) (*)    Spec Grav, UA >=1.030 (*)    Blood, UA large (*)  Protein Ur, POC =100 (*)    Leukocytes, UA Small (1+) (*)    All other components within normal limits  URINE CULTURE    EKG   Radiology No results found.  Procedures Procedures (including critical care time)  Medications Ordered in UC Medications - No data to display  Initial Impression / Assessment and Plan / UC Course  I have reviewed the triage vital signs and the nursing notes.  Pertinent labs & imaging results that were available during my care of the patient were reviewed by me and considered in my medical decision making (see chart for details).     Urinalysis did show signs of urinary tract infection.  Urine culture is pending. Will treat with another round of cephalexin antibiotic x10 days given patient's allergies.  Advised patient of the importance of following up with urologist due to multiple urinary tract infections over the past few months.  No red flags seen on exam.  Do not think patient is in need of immediate medical attention in the hospital at this time.  Discussed clinical course with supervising physician (Dr. Lanny Cramp). Discussed strict return precautions. Patient verbalized understanding and is agreeable with plan.  Final Clinical Impressions(s) / UC Diagnoses   Final diagnoses:  Acute cystitis with hematuria  Dysuria  Urinary frequency     Discharge Instructions      You have been prescribed an antibiotic to treat urinary tract infection.  Please go to the hospital if symptoms worsen.  Urine culture is pending.  We will call with results.  Please follow-up with urologist soon as possible.     ED  Prescriptions     Medication Sig Dispense Auth. Provider   cephALEXin (KEFLEX) 500 MG capsule Take 1 capsule (500 mg total) by mouth 2 (two) times daily for 10 days. 20 capsule Odis Luster, FNP      PDMP not reviewed this encounter.   Odis Luster, FNP 12/24/20 1205

## 2020-12-25 NOTE — Telephone Encounter (Signed)
Patient was seen on 12/12/20 at ED for her gout which is better now. She was however just seen yesterday for another UTI. She is requesting urology referral for this. She stated she has had too many UTIs freq and wants to see a specialist. Also, she stopped taking the allopurinol right before going to the hospital for the gout. She thinks that was making the gout worse.

## 2020-12-26 LAB — URINE CULTURE: Culture: >=100000 — AB

## 2020-12-27 NOTE — Addendum Note (Signed)
Addended by: [name] on: 12/27/2020 03:48 PM   Modules accepted: Orders

## 2020-12-27 NOTE — Telephone Encounter (Signed)
Patient notified referral was done. Advised to call back if she has any issues off the allopurinol.

## 2020-12-27 NOTE — Telephone Encounter (Addendum)
I put I the urology referral.  If she has more gout trouble off allopurinol then we can readdress it that point.  She is at risk for gout recurrence with or without med, possibly higher risk of recurrent off allopurinol.  Thanks.

## 2021-01-01 ENCOUNTER — Other Ambulatory Visit: Payer: Self-pay | Admitting: Family Medicine

## 2021-01-07 ENCOUNTER — Telehealth: Payer: Self-pay

## 2021-01-07 MED ORDER — TRULICITY 4.5 MG/0.5ML ~~LOC~~ SOAJ: 4.5000 mg | SUBCUTANEOUS | 1 refills | Status: DC

## 2021-01-07 NOTE — Telephone Encounter (Signed)
Contacted patient to let her know higher dose Trulicity 4.5 mg weekly has been sent to her pharmacy. She states she has not heard anything about endocrinology referral. I will check on next steps for her. She has urology initial consult - 12/21/20.  Reports the following BG log:  FASTING    AFTER MEAL 01/07/21 - 162 (fasting) 01/05/21 - 169 (fasting) 01/04/21 - 195 (after meal) 01/03/21 - 162 (morning)  Debbora Dus, PharmD Clinical Pharmacist [facility] [phone]

## 2021-01-07 NOTE — Telephone Encounter (Signed)
Patient would like to know next steps for endocrinology referral. States she has not heard anything.

## 2021-01-10 ENCOUNTER — Telehealth: Payer: Self-pay | Admitting: Family Medicine

## 2021-01-10 ENCOUNTER — Other Ambulatory Visit: Payer: Self-pay | Admitting: Family Medicine

## 2021-01-10 DIAGNOSIS — R300 Dysuria: Secondary | ICD-10-CM

## 2021-01-10 NOTE — Addendum Note (Signed)
Addended by: [name] on: 01/10/2021 05:04 PM   Modules accepted: Orders

## 2021-01-10 NOTE — Telephone Encounter (Signed)
Continue drinking plenty of water and reculture urine when possible. I put in the orders.    Did she improve at all with the abx?  If not, then needs recheck re: other causes.   If some improvement initially with abx but return of the same sx, then I would get urine sample collected and restart keflex.  Let me know about her situation in case I need to send another rx.  Thanks.

## 2021-01-10 NOTE — Telephone Encounter (Signed)
lmtcb

## 2021-01-10 NOTE — Telephone Encounter (Signed)
Pt called stating that she still have UTI after finishing her antibiotic. Pt states that she has a appt with a kidney specialist on 01/21/21. Pt is asking what should she do in the meantime. Please advise.

## 2021-01-13 NOTE — Telephone Encounter (Signed)
Spoke with [facility] is booking out until late Dec/Jan for NP appts.   [facility] is booking out Jan 2023 for NP appts.   Advised that [facility] has reviewed the referral and that they may be calling her soon to schedule her but that she can call them as well if she doesn't want to wait on them.   Will send this back to [place].

## 2021-01-20 NOTE — Progress Notes (Signed)
Reached out to the patient and gave her the phone # for [facility] as she still has not heard anything about a referral appoinment, she will call the office .  Debbora Dus, CPP notified  [name], [place] Assistant [phone]  Total time spent for month CPA: 5 min.

## 2021-01-23 ENCOUNTER — Telehealth: Payer: Self-pay

## 2021-01-23 DIAGNOSIS — E1165 Type 2 diabetes mellitus with hyperglycemia: Secondary | ICD-10-CM

## 2021-01-23 NOTE — Chronic Care Management (AMB) (Addendum)
Chronic Care Management Pharmacy Assistant   Name: MAKYLEE SANBORN  MRN: [id number] DOB: 03-17-1955  Reason for Encounter:  Diabetes Disease State   Recent office visits:  None since last CCM contact  Recent consult visits:  None since last CCM contact  Hospital visits:  12/24/20-[facility] presented for dysuria and hematuria and UA (abnormal). Start cephalexin 500 mg X 10 days.   Allergies  Allergen Reactions   Almond Oil Anaphylaxis, Shortness Of Breath and Swelling   Morphine And Related Shortness Of Breath and Other (See Comments)    Pt. States while in the hospital it affected her breathing, O2 dropped to the 70's   Atorvastatin Other (See Comments)    Leg weakness   Ceclor [Cefaclor] Nausea And Vomiting   Sulfa Antibiotics Nausea And Vomiting   Ciprofloxacin Other (See Comments)    Makes joints and muscles ache   Levaquin [Levofloxacin] Other (See Comments)    Body aches   Losartan Other (See Comments)    Weakness    Statins Other (See Comments)    Leg and body weakness    Medications: Outpatient Encounter Medications as of 01/23/2021  Medication Sig   acetaminophen (TYLENOL) 500 MG tablet Take 500 mg by mouth every 6 (six) hours as needed for moderate pain.   albuterol (VENTOLIN HFA) 108 (90 Base) MCG/ACT inhaler Inhale 2 puffs into the lungs every 6 (six) hours as needed.   BAYER CONTOUR NEXT TEST test strip 1 each by Other route daily. As directed   budesonide (PULMICORT) 0.5 MG/2ML nebulizer solution Take 2 mLs (0.5 mg total) by nebulization 2 (two) times daily.   buPROPion (WELLBUTRIN XL) 300 MG 24 hr tablet TAKE 1 TABLET BY MOUTH DAILY   Cholecalciferol (VITAMIN D-3) 5000 UNITS TABS Take 5,000 Units by mouth every other day.    colchicine 0.6 MG tablet Take 1 tablet (0.6 mg total) by mouth daily as needed (for gout).   dicyclomine (BENTYL) 20 MG tablet TAKE 1 TABLET UP TO FOUR TIMES A DAY FOR GI CRAMPING, PAIN, AND NAUSEA/VOMITING  AS NEEDED   Dulaglutide (TRULICITY) 4.5 QP/6.1PJ SOPN Inject 4.5 mg as directed once a week.   DULoxetine (CYMBALTA) 60 MG capsule TAKE 1 CAPSULE BY MOUTH ONCE DAILY.   furosemide (LASIX) 20 MG tablet Take 3 tablets (60 mg total) by mouth 2 (two) times daily.   gabapentin (NEURONTIN) 100 MG capsule TAKE 3 CAPSULES BY MOUTH AT BEDTIME.   GLOBAL EASE INJECT PEN NEEDLES 32G X 4 MM MISC USE AS DIRECTED WITH VICTOZA AND TRESIBA.   insulin degludec (TRESIBA FLEXTOUCH) 200 UNIT/ML FlexTouch Pen INJECT 90 UNITS UNDER SKIN ONCE DAILY   ipratropium-albuterol (DUONEB) 0.5-2.5 (3) MG/3ML SOLN Take 3 mLs by nebulization every 6 (six) hours as needed (wheezing).   Iron, Ferrous Sulfate, 325 (65 Fe) MG TABS Take 325 mg by mouth daily.   mesalamine (LIALDA) 1.2 g EC tablet Take 1.2 g by mouth 2 (two) times daily.   metolazone (ZAROXOLYN) 5 MG tablet TAKE 1 TABLET BY MOUTH AS NEEDED.   MICROLET LANCETS MISC 1 each by Other route. As directed   nystatin (MYCOSTATIN/NYSTOP) powder Apply 1 application topically 3 (three) times daily.   ondansetron (ZOFRAN) 4 MG tablet Take 1 tablet (4 mg total) by mouth every 6 (six) hours.   oxyCODONE-acetaminophen (PERCOCET/ROXICET) 5-325 MG tablet Take 1 tablet by mouth every 6 (six) hours as needed for severe pain.   pantoprazole (PROTONIX) 40 MG tablet TAKE 1  TABLET BY MOUTH AT BEDTIME   polyethylene glycol (MIRALAX / GLYCOLAX) packet Take 17 g by mouth daily as needed for mild constipation.   potassium chloride (MICRO-K) 10 MEQ CR capsule TAKE 5 CAPSULES (50 MEQ TOTAL) BY MOUTH 2 (TWO) TIMES DAILY   predniSONE (DELTASONE) 20 MG tablet Take 2 tablets (40 mg total) by mouth daily.   propranolol (INDERAL) 10 MG tablet TAKE 1 TABLET BY MOUTH 2 TIMES DAILY   revefenacin (YUPELRI) 175 MCG/3ML nebulizer solution Take 3 mLs (175 mcg total) by nebulization daily.   rOPINIRole (REQUIP) 4 MG tablet TAKE 1 TABLET BY MOUTH TWICE DAILY   spironolactone (ALDACTONE) 25 MG tablet TAKE 1  TABLET BY MOUTH DAILY.   traMADol (ULTRAM) 50 MG tablet Take 1 tablet (50 mg total) by mouth 3 (three) times daily as needed.   UNABLE TO FIND CPAP- At bedtime   No facility-administered encounter medications on file as of 01/23/2021.     Recent Relevant Labs: Lab Results  Component Value Date/Time   HGBA1C 9.4 (H) 11/28/2020 03:56 PM   HGBA1C 8.5 (H) 09/20/2020 01:14 AM    Kidney Function Lab Results  Component Value Date/Time   CREATININE 1.44 (H) 11/28/2020 03:56 PM   CREATININE 1.48 (H) 09/26/2020 03:18 PM   CREATININE 1.16 09/04/2019 12:00 AM   CREATININE 1.3 04/26/2019 12:00 AM   GFR 38.07 (L) 11/28/2020 03:56 PM   GFRNONAA 43 (L) 09/22/2020 03:40 AM   GFRAA 69 01/19/2020 01:56 PM     Contacted patient on 01/24/21 to discuss diabetes disease state.   Current antihyperglycemic regimen:  Trulicity 3 mg - Inject once weekly (thursdays or fridays) Tyler Aas - 90 units daily (mid-day around 1- 4 PM)    Patient verbally confirms she is taking the above medications as directed. Yes  What diet changes have been made to improve diabetes control? No diet changes identified.  What recent interventions/DTPs have been made to improve glycemic control:  11/13/02- Increased Trulicity to 5.4UJ weekly- The patient reports she has not started the 4.5 due to not in stock and possible shortage through the month of January 2023.  Have there been any recent hospitalizations or ED visits since last visit with CPP? No  Patient denies hypoglycemic symptoms, including Pale, Sweaty, Shaky, Hungry, Nervous/irritable, and Vision changes  Patient denies hyperglycemic symptoms, including blurry vision, excessive thirst, fatigue, polyuria, and weakness  How often are you checking your blood sugar? twice daily The patient sleeps in and her fasting BG's are around noon or soon after when she wakes up.  What are your blood sugars ranging?  Fasting:  01/24/21-162    ranging [phone] (she ate  something prior)  During the week, how often does your blood glucose drop below 70? Never  Are you checking your feet daily/regularly? Yes  Adherence Review: Is the patient currently on a STATIN medication? No Is the patient currently on ACE/ARB medication? No Does the patient have >5 day gap between last estimated fill dates? No  Care Gaps: Annual wellness visit in last year? Yes Most recent A1C reading:9.4  11/28/20 Most Recent BP reading:124/78  89-P 11/28/20  Last eye exam / retinopathy screening:12/2019 Last diabetic foot exam:11/28/20  Counseled patient on importance of annual eye and foot exam. Reminded the patient to have eyes checked.  Star Rating Drugs:  Medication:  Last Fill: Day Supply Trulicity 2/1.3 pen 08/65/78 28 Tresiba 200unit/ml 12/12/20 20   No appointments scheduled within the next 30 days. The patient did call Endocrinology  to make an appointment and was told they will call her for appointment after referral reviewed. She has urology appointment next year 2023.  Debbora Dus, CPP notified  [name], [place] Assistant [phone]  I have reviewed the care management and care coordination activities outlined in this encounter and I am certifying that I agree with the content of this note. Reviewed. Pt not a good candidate for SGLT-2 for CKD due to frequent UTIs. SGLT would not lower BG with current renal function. GFR < 45, would not restart metformin. She will likely need mealtime insulin. For now, we will await Trulicity restock - see how she does on 4.5 mg and await endo referral.  Debbora Dus, PharmD Clinical Pharmacist [facility] at [facility] [phone]

## 2021-02-04 ENCOUNTER — Ambulatory Visit (INDEPENDENT_AMBULATORY_CARE_PROVIDER_SITE_OTHER): Payer: Medicare HMO

## 2021-02-04 ENCOUNTER — Other Ambulatory Visit: Payer: Self-pay

## 2021-02-04 ENCOUNTER — Ambulatory Visit
Admission: RE | Admit: 2021-02-04 | Discharge: 2021-02-04 | Disposition: A | Discharge status: Home / Self Care | Payer: Medicare HMO | Source: Ambulatory Visit | Attending: Emergency Medicine | Admitting: Emergency Medicine

## 2021-02-04 VITALS — BP 148/82 | HR 88 | Temp 98.5°F | Resp 18

## 2021-02-04 DIAGNOSIS — R059 Cough, unspecified: Secondary | ICD-10-CM

## 2021-02-04 DIAGNOSIS — R0602 Shortness of breath: Secondary | ICD-10-CM | POA: Diagnosis not present

## 2021-02-04 DIAGNOSIS — J441 Chronic obstructive pulmonary disease with (acute) exacerbation: Secondary | ICD-10-CM

## 2021-02-04 MED ORDER — PREDNISONE 10 MG PO TABS: 40.0000 mg | ORAL_TABLET | Freq: Every day | ORAL | 0 refills | Status: DC

## 2021-02-04 MED ORDER — TRULICITY 4.5 MG/0.5ML ~~LOC~~ SOAJ: 4.5000 mg | SUBCUTANEOUS | 1 refills | Status: DC

## 2021-02-04 NOTE — Discharge Instructions (Addendum)
Go to the emergency department if you have worsening symptoms, including shortness of breath.    Continue the albuterol treatments as directed.  Take prednisone as directed.    Follow-up with your primary care provider.

## 2021-02-04 NOTE — ED Provider Notes (Signed)
Roderic Palau    CSN: [id number] Arrival date & time: 02/04/21  1348      History   Chief Complaint Chief Complaint  Patient presents with   Cough    nasal   Nasal Congestion    HPI Kathleen Hatfield is a 65 y.o. female.  Patient presents with 1 week history progressively worse cough, tightness with breathing, shortness of breath.  She has felt warm but did not take her temperature.  Treatment at home with albuterol nebulizer treatments.  She denies chest pain, ear pain, sore throat, rash, or other symptoms.  Her medical history includes COPD, diabetes, hypertension, heart failure, CKD, morbid obesity.  The history is provided by the patient and medical records.   Past Medical History:  Diagnosis Date   Anemia    Arthritis    Asthma    Chronic diastolic CHF 50/0938   Echocardiogram 05/2019: EF 70, no RWMA, mild LVH, Gr 1 DD, normal RVSF, severe LVH, borderline asc Aorta (39 mm)   CKD (chronic kidney disease)    Depression    Diabetes mellitus without complication (HCC)    Diverticulitis    Dyspnea    with exertion   GERD (gastroesophageal reflux disease)    History of blood transfusion    Hyperlipidemia    cannot tolerate statins   Hypertension    patient states she has never had high blood pressure.    Nuclear stress test    Myoview 05/2019: EF 83, no ischemia or infarction; Low Risk   Peripheral neuropathy    Pneumonia    PONV (postoperative nausea and vomiting)    RLS (restless legs syndrome)    Sleep apnea    uses CPAP   Ulcerative colitis ([place])    dr Collene Mares    Patient Active Problem List   Diagnosis Date Noted   Rash 09/27/2020   COPD exacerbation ([place]) 09/21/2020   Acute exacerbation of COPD with asthma ([place]) 09/19/2020   Mixed diabetic hyperlipidemia associated with type 2 diabetes mellitus ([place]) 09/19/2020   Pain of foot 06/20/2020   Medicare annual wellness visit, initial 06/20/2020   Constipation 06/11/2020   Diverticulosis of colon 06/11/2020    Irritable bowel syndrome 18/29/9371   Periumbilical pain 69/67/8938   RLS (restless legs syndrome)    Acute asthma exacerbation 01/09/2020   Asthma exacerbation 01/08/2020   Muscle weakness 01/08/2020   Grade I diastolic dysfunction 12/23/5100   Statin myopathy 10/10/2019   Advance care planning 06/14/2019   RLQ abdominal pain 06/14/2019   COPD (chronic obstructive pulmonary disease) ([place]) 06/14/2019   Leukocytosis 06/14/2019   Hypercalcemia 06/14/2019   Gout 06/14/2019   Type 2 diabetes mellitus with hyperglycemia, without long-term current use of insulin (HCC)    COPD with acute exacerbation ([place]) 05/03/2017   Acute renal failure superimposed on stage 2 chronic kidney disease ([place]) 04/02/2016   Diabetes mellitus without complication (HCC)    CKD (chronic kidney disease)    Hypersomnia with sleep apnea 09/16/2015   Fatigue 06/07/2015   Morbid obesity ([place]) 06/07/2015   Chronic diastolic heart failure ([place])    Pneumonia of both lower lobes due to infectious organism 02/09/2013   OSA on CPAP 08/23/2012   Essential hypertension    Depression    GERD without esophagitis    Hyperlipidemia    Ulcerative colitis ([place])    Patellar tendinitis 05/25/2011   Knee pain 05/25/2011   Myofascial pain 05/25/2011   Cervicalgia 05/25/2011    Past Surgical History:  Procedure Laterality Date   ABDOMINAL HYSTERECTOMY     APPENDECTOMY     BREAST BIOPSY     BREAST SURGERY     left biopsy   CARDIAC CATHETERIZATION     CESAREAN SECTION     CHOLECYSTECTOMY     HERNIA REPAIR     INSERTION OF MESH N/A 11/15/2015   Procedure: INSERTION OF MESH;  Surgeon: [name], MD;  Location: [facility] ORS;  Service: General;  Laterality: N/A;   LAPAROSCOPIC LYSIS OF ADHESIONS N/A 11/15/2015   Procedure: LAPAROSCOPIC LYSIS OF ADHESIONS;  Surgeon: [name], MD;  Location: [facility];  Service: General;  Laterality: N/A;   RIGHT HEART CATHETERIZATION N/A 02/27/2013   Procedure: RIGHT HEART CATH;  Surgeon:  Blane Ohara, MD;  Location: [facility] CATH LAB;  Service: Cardiovascular;  Laterality: N/A;   RIGHT HEART CATHETERIZATION N/A 12/14/2013   Procedure: RIGHT HEART CATH;  Surgeon: Larey Dresser, MD;  Location: [facility] CATH LAB;  Service: Cardiovascular;  Laterality: N/A;   SIGMOIDECTOMY  2010   diverticular disease   VENTRAL HERNIA REPAIR N/A 11/15/2015   Procedure: LAPAROSCOPIC VENTRAL WALL HERNIA REPAIR;  Surgeon: [name], MD;  Location: [facility];  Service: General;  Laterality: N/A;    OB History     Gravida  2   Para  1   Term      Preterm      AB  1   Living         SAB      IAB      Ectopic      Multiple      Live Births               Home Medications    Prior to Admission medications   Medication Sig Start Date End Date Taking? Authorizing Provider  acetaminophen (TYLENOL) 500 MG tablet Take 500 mg by mouth every 6 (six) hours as needed for moderate pain.   Yes [provider]  albuterol (VENTOLIN HFA) 108 (90 Base) MCG/ACT inhaler Inhale 2 puffs into the lungs every 6 (six) hours as needed. 02/16/20  Yes [name], MD  budesonide (PULMICORT) 0.5 MG/2ML nebulizer solution Take 2 mLs (0.5 mg total) by nebulization 2 (two) times daily. 05/23/20  Yes [name], MD  buPROPion (WELLBUTRIN XL) 300 MG 24 hr tablet TAKE 1 TABLET BY MOUTH DAILY 09/25/20  Yes [name], MD  Cholecalciferol (VITAMIN D-3) 5000 UNITS TABS Take 5,000 Units by mouth every other day.    Yes [provider]  colchicine 0.6 MG tablet Take 1 tablet (0.6 mg total) by mouth daily as needed (for gout). 08/15/20  Yes [name], MD  dicyclomine (BENTYL) 20 MG tablet TAKE 1 TABLET UP TO FOUR TIMES A DAY FOR GI CRAMPING, PAIN, AND NAUSEA/VOMITING AS NEEDED 11/26/20  Yes [name], MD  DULoxetine (CYMBALTA) 60 MG capsule TAKE 1 CAPSULE BY MOUTH ONCE DAILY. 01/01/21  Yes [name], MD  furosemide (LASIX) 20 MG tablet Take 3 tablets (60 mg total) by mouth 2  (two) times daily. 05/13/20  Yes Weaver, Scott T, PA-C  gabapentin (NEURONTIN) 100 MG capsule TAKE 3 CAPSULES BY MOUTH AT BEDTIME. 09/25/20  Yes [name], MD  insulin degludec (TRESIBA FLEXTOUCH) 200 UNIT/ML FlexTouch Pen INJECT 90 UNITS UNDER SKIN ONCE DAILY 11/26/20  Yes [name], MD  ipratropium-albuterol (DUONEB) 0.5-2.5 (3) MG/3ML SOLN Take 3 mLs by nebulization every 6 (six) hours as  needed (wheezing). 05/25/19  Yes [provider]  Iron, Ferrous Sulfate, 325 (65 Fe) MG TABS Take 325 mg by mouth daily. 12/01/20  Yes [name], MD  metolazone (ZAROXOLYN) 5 MG tablet TAKE 1 TABLET BY MOUTH AS NEEDED. 08/19/20  Yes [name], MD  ondansetron (ZOFRAN) 4 MG tablet Take 1 tablet (4 mg total) by mouth every 6 (six) hours. 05/23/20  Yes Faustino Congress, NP  oxyCODONE-acetaminophen (PERCOCET/ROXICET) 5-325 MG tablet Take 1 tablet by mouth every 6 (six) hours as needed for severe pain. 12/12/20  Yes Plunkett, Loree Fee, MD  predniSONE (DELTASONE) 10 MG tablet Take 4 tablets (40 mg total) by mouth daily for 5 days. 02/04/21 02/09/21 Yes Sharion Balloon, NP  propranolol (INDERAL) 10 MG tablet TAKE 1 TABLET BY MOUTH 2 TIMES DAILY 01/01/21  Yes [name], MD  revefenacin [facility]) 175 MCG/3ML nebulizer solution Take 3 mLs (175 mcg total) by nebulization daily. 05/20/20  Yes [name], MD  rOPINIRole (REQUIP) 4 MG tablet TAKE 1 TABLET BY MOUTH TWICE DAILY 01/01/21  Yes [name], MD  spironolactone (ALDACTONE) 25 MG tablet TAKE 1 TABLET BY MOUTH DAILY. 11/21/20  Yes [name], MD  traMADol (ULTRAM) 50 MG tablet Take 1 tablet (50 mg total) by mouth 3 (three) times daily as needed. 06/18/20  Yes [name], MD  BAYER CONTOUR NEXT TEST test strip 1 each by Other route daily. As directed 07/30/15   [provider]  Dulaglutide (TRULICITY) 4.5 BH/4.1PF SOPN Inject 4.5 mg as directed once a week. 02/04/21   [name], MD  GLOBAL EASE INJECT PEN  NEEDLES 32G X 4 MM MISC USE AS DIRECTED WITH VICTOZA AND TRESIBA. 08/12/20   [name], MD  mesalamine (LIALDA) 1.2 g EC tablet Take 1.2 g by mouth 2 (two) times daily.    [provider]  MICROLET LANCETS MISC 1 each by Other route. As directed 07/30/15   [provider]  nystatin (MYCOSTATIN/NYSTOP) powder Apply 1 application topically 3 (three) times daily. 09/26/20   [name], MD  pantoprazole (PROTONIX) 40 MG tablet TAKE 1 TABLET BY MOUTH AT BEDTIME 08/21/20   [name], MD  polyethylene glycol [facility] / Floria Raveling) packet Take 17 g by mouth daily as needed for mild constipation.    [provider]  potassium chloride (MICRO-K) 10 MEQ CR capsule TAKE 5 CAPSULES (50 MEQ TOTAL) BY MOUTH 2 (TWO) TIMES DAILY 12/16/20   Kathlen Mody, Scott T, PA-C  UNABLE TO FIND CPAP- At bedtime    [provider]    Family History Family History  Problem Relation Age of Onset   Heart disease Mother    Hypertension Mother    Dementia Mother    Heart disease Father    COPD Father    Hypertension Father    Heart disease Brother    Other Brother        knee replacement; hip replacement   Other Brother        cant brathe when laying down   Breast cancer Neg Hx    Colon cancer Neg Hx     Social History Social History   Tobacco Use   Smoking status: Never   Smokeless tobacco: Never  Vaping Use   Vaping Use: Never used  Substance Use Topics   Alcohol use: No   Drug use: No     Allergies   Almond oil, Morphine and related, Atorvastatin, Ceclor [cefaclor], Sulfa  antibiotics, Ciprofloxacin, Levaquin [levofloxacin], Losartan, and Statins   Review of Systems Review of Systems  Constitutional:  Positive for fever. Negative for chills.  HENT:  Negative for ear pain and sore throat.   Eyes:  Negative for pain and visual disturbance.  Respiratory:  Positive for cough, chest tightness and shortness of breath.   Cardiovascular:  Negative for chest pain  and palpitations.  Gastrointestinal:  Negative for diarrhea and vomiting.  Skin:  Negative for color change and rash.  All other systems reviewed and are negative.   Physical Exam Triage Vital Signs ED Triage Vitals [02/04/21 1406]  Enc Vitals Group     BP      Pulse      Resp      Temp      Temp src      SpO2      Weight      Height      Head Circumference      Peak Flow      Pain Score 0     Pain Loc      Pain Edu?      Excl. in [place]?    No data found.  Updated Vital Signs BP (!) 148/82 (BP Location: Left Arm)   Pulse 88   Temp 98.5 F (36.9 C) (Oral)   Resp 18   SpO2 92%   Visual Acuity Right Eye Distance:   Left Eye Distance:   Bilateral Distance:    Right Eye Near:   Left Eye Near:    Bilateral Near:     Physical Exam Vitals and nursing note reviewed.  Constitutional:      General: She is not in acute distress.    Appearance: She is well-developed. She is obese.  HENT:     Head: Normocephalic and atraumatic.     Right Ear: Tympanic membrane normal.     Left Ear: Tympanic membrane normal.     Nose: Nose normal.     Mouth/Throat:     Mouth: Mucous membranes are moist.     Pharynx: Oropharynx is clear.  Eyes:     Conjunctiva/sclera: Conjunctivae normal.  Cardiovascular:     Rate and Rhythm: Normal rate and regular rhythm.     Heart sounds: Normal heart sounds.  Pulmonary:     Effort: Pulmonary effort is normal. No respiratory distress.     Breath sounds: Wheezing present.     Comments: Few expiratory wheezes. Abdominal:     Palpations: Abdomen is soft.     Tenderness: There is no abdominal tenderness.  Musculoskeletal:     Cervical back: Neck supple.  Skin:    General: Skin is warm and dry.  Neurological:     Mental Status: She is alert.  Psychiatric:        Mood and Affect: Mood normal.        Behavior: Behavior normal.     UC Treatments / Results  Labs (all labs ordered are listed, but only abnormal results are displayed) Labs  Reviewed  COVID-19, FLU A+B AND RSV    EKG   Radiology DG Chest 2 View  Result Date: 02/04/2021 CLINICAL DATA:  Cough, shortness of breath. EXAM: CHEST - 2 VIEW COMPARISON:  October 25, 2020. FINDINGS: The heart size and mediastinal contours are within normal limits. Both lungs are clear. The visualized skeletal structures are unremarkable. IMPRESSION: No active cardiopulmonary disease. Electronically Signed   By: Marijo Conception M.D.   On: 02/04/2021 15:27  Procedures Procedures (including critical care time)  Medications Ordered in UC Medications - No data to display  Initial Impression / Assessment and Plan / UC Course  I have reviewed the triage vital signs and the nursing notes.  Pertinent labs & imaging results that were available during my care of the patient were reviewed by me and considered in my medical decision making (see chart for details).  COPD exacerbation, cough, shortness of breath.  Chest x-ray negative.  COVID, flu, RSV pending.  ED precautions discussed.  Treating with prednisone and continued albuterol treatments.  Instructed patient to follow-up with her PCP later this week.  She agrees to plan of care.   Final Clinical Impressions(s) / UC Diagnoses   Final diagnoses:  Cough, unspecified type  SOB (shortness of breath)  COPD exacerbation (HCC)     Discharge Instructions      Go to the emergency department if you have worsening symptoms, including shortness of breath.    Continue the albuterol treatments as directed.  Take prednisone as directed.    Follow-up with your primary care provider.         ED Prescriptions     Medication Sig Dispense Auth. Provider   predniSONE (DELTASONE) 10 MG tablet Take 4 tablets (40 mg total) by mouth daily for 5 days. 20 tablet Sharion Balloon, NP      PDMP not reviewed this encounter.   Sharion Balloon, NP 02/04/21 1549

## 2021-02-04 NOTE — ED Triage Notes (Signed)
Pt c/o cough, nasal congestion, SOB and chest tightness x 1 week.

## 2021-02-04 NOTE — Progress Notes (Addendum)
Contacted [facility] [address] [phone] and they currently have Trulicity 5.0DT in stock.  Debbora Dus, CPP notified  [name], [place] Assistant [phone]  Total time spent for month CPA: 10 min.

## 2021-02-04 NOTE — Addendum Note (Signed)
Addended by: Debbora Dus on: 02/04/2021 03:06 PM   Modules accepted: Orders

## 2021-02-04 NOTE — Telephone Encounter (Signed)
[place], can you ask patient which [facility] are close to her? We can check to see if they have Trulicity 4.5 mg in stock.  Debbora Dus, PharmD Clinical Pharmacist [place] [facility] at [facility] [phone]

## 2021-02-05 LAB — COVID-19, FLU A+B AND RSV
Influenza A, NAA: NOT DETECTED
Influenza B, NAA: NOT DETECTED
RSV, NAA: NOT DETECTED
SARS-CoV-2, NAA: NOT DETECTED

## 2021-02-07 ENCOUNTER — Telehealth: Payer: Self-pay

## 2021-02-07 ENCOUNTER — Ambulatory Visit (INDEPENDENT_AMBULATORY_CARE_PROVIDER_SITE_OTHER): Payer: Medicare HMO

## 2021-02-07 ENCOUNTER — Other Ambulatory Visit: Payer: Self-pay

## 2021-02-07 ENCOUNTER — Ambulatory Visit (INDEPENDENT_AMBULATORY_CARE_PROVIDER_SITE_OTHER): Payer: Medicare HMO | Admitting: Primary Care

## 2021-02-07 ENCOUNTER — Ambulatory Visit (INDEPENDENT_AMBULATORY_CARE_PROVIDER_SITE_OTHER)
Admission: RE | Admit: 2021-02-07 | Discharge: 2021-02-07 | Disposition: A | Discharge status: Home / Self Care | Payer: Medicare HMO | Source: Ambulatory Visit | Attending: Primary Care | Admitting: Primary Care

## 2021-02-07 ENCOUNTER — Encounter: Payer: Self-pay | Admitting: Primary Care

## 2021-02-07 VITALS — BP 150/84 | HR 88 | Temp 97.9°F | Wt 268.1 lb

## 2021-02-07 DIAGNOSIS — R051 Acute cough: Secondary | ICD-10-CM

## 2021-02-07 DIAGNOSIS — R0609 Other forms of dyspnea: Secondary | ICD-10-CM | POA: Diagnosis not present

## 2021-02-07 DIAGNOSIS — R0600 Dyspnea, unspecified: Secondary | ICD-10-CM | POA: Diagnosis not present

## 2021-02-07 DIAGNOSIS — E782 Mixed hyperlipidemia: Secondary | ICD-10-CM

## 2021-02-07 DIAGNOSIS — J441 Chronic obstructive pulmonary disease with (acute) exacerbation: Secondary | ICD-10-CM

## 2021-02-07 DIAGNOSIS — J449 Chronic obstructive pulmonary disease, unspecified: Secondary | ICD-10-CM

## 2021-02-07 DIAGNOSIS — E1165 Type 2 diabetes mellitus with hyperglycemia: Secondary | ICD-10-CM

## 2021-02-07 MED ORDER — PREDNISONE 20 MG PO TABS: ORAL_TABLET | ORAL | 0 refills | Status: DC

## 2021-02-07 MED ORDER — DOXYCYCLINE HYCLATE 100 MG PO TABS: 100.0000 mg | ORAL_TABLET | Freq: Two times a day (BID) | ORAL | 0 refills | Status: DC

## 2021-02-07 NOTE — Telephone Encounter (Signed)
Patient evaluated.  

## 2021-02-07 NOTE — Progress Notes (Signed)
Subjective:    Patient ID: Kathleen Hatfield, female    DOB: [birth year], 65 y.o.   MRN: [id number]  HPI  Kathleen Hatfield is a very pleasant 65 y.o. female patient of Dr. [name] with a significant medical history including COPD with acute exacerbation, Asthma with acute exacerbation, CKD, type 2 diabetes, morbid obesity, OSA, CHF, hypertension who presents today   Evaluated at [facility] on 02/04/21 for a one week history of progressing cough, chest tightness, dyspnea. During this visit she was tested for RSV, Covid, and influenza all of which were negative.  She underwent chest x-ray which did not show pneumonia or other infiltrates.  She was treated with prednisone 40 mg daily x5 days and encouraged to continue nebulizer treatments.  Since her urgent care visit her symptoms have worsened and she has since developed a cough.  She endorses that she did not have a cough during her urgent care visit.  Her nasal congestion, ear pressure, dyspnea have increased.  She has moderate dyspnea with mild exertion and mild dyspnea with rest at times.    She denies fevers, chills, body aches. She noticed some improvement in nasal congestion with prednisone, otherwise no improvement. She's using her nebulizer treatments with temporary improvement.    Review of Systems  Constitutional:  Positive for fatigue. Negative for fever.  HENT:  Positive for congestion.   Respiratory:  Positive for cough, chest tightness and shortness of breath. Negative for wheezing.   Musculoskeletal:  Negative for myalgias.        Past Medical History:  Diagnosis Date   Anemia    Arthritis    Asthma    Chronic diastolic CHF 45/3646   Echocardiogram 05/2019: EF 70, no RWMA, mild LVH, Gr 1 DD, normal RVSF, severe LVH, borderline asc Aorta (39 mm)   CKD (chronic kidney disease)    Depression    Diabetes mellitus without complication (HCC)    Diverticulitis    Dyspnea    with exertion   GERD (gastroesophageal reflux  disease)    History of blood transfusion    Hyperlipidemia    cannot tolerate statins   Hypertension    patient states she has never had high blood pressure.    Nuclear stress test    Myoview 05/2019: EF 83, no ischemia or infarction; Low Risk   Peripheral neuropathy    Pneumonia    PONV (postoperative nausea and vomiting)    RLS (restless legs syndrome)    Sleep apnea    uses CPAP   Ulcerative colitis ([place])    dr Collene Mares    Social History   Socioeconomic History   Marital status: Divorced    Spouse name: Not on file   Number of children: Not on file   Years of education: Not on file   Highest education level: Not on file  Occupational History   Occupation: unemployed  Tobacco Use   Smoking status: Never   Smokeless tobacco: Never  Vaping Use   Vaping Use: Never used  Substance and Sexual Activity   Alcohol use: No   Drug use: No   Sexual activity: Not on file  Other Topics Concern   Not on file  Social History Narrative   Live with spouse   Right handed   Social Determinants of Health   Financial Resource Strain: Low Risk    Difficulty of Paying Living Expenses: Not very hard  Food Insecurity: No Food Insecurity   Worried About Running Out  of Food in the Last Year: Never true   [place] in the Last Year: Never true  Transportation Needs: No Transportation Needs   Lack of Transportation (Medical): No   Lack of Transportation (Non-Medical): No  Physical Activity: Inactive   Days of Exercise per Week: 0 days   Minutes of Exercise per Session: 0 min  Stress: Not on file  Social Connections: Not on file  Intimate Partner Violence: Not on file    Past Surgical History:  Procedure Laterality Date   ABDOMINAL HYSTERECTOMY     APPENDECTOMY     BREAST BIOPSY     BREAST SURGERY     left biopsy   CARDIAC CATHETERIZATION     CESAREAN [place] N/A 11/15/2015   Procedure: INSERTION OF MESH;   Surgeon: [name], MD;  Location: [facility];  Service: General;  Laterality: N/A;   LAPAROSCOPIC LYSIS OF ADHESIONS N/A 11/15/2015   Procedure: LAPAROSCOPIC LYSIS OF ADHESIONS;  Surgeon: [name], MD;  Location: [facility];  Service: General;  Laterality: N/A;   RIGHT HEART CATHETERIZATION N/A 02/27/2013   Procedure: RIGHT HEART CATH;  Surgeon: Blane Ohara, MD;  [facility] C[facility] LAB;  Service: Cardiovascular;  Laterality: N/A;   RIGHT HEART CATHETERIZATION N/A 12/14/2013   Procedure: RIGHT HEART CATH;  Surgeon: Larey Dresser, MD;  Location: [facility] CATH LAB;  Service: Cardiovascular;  Laterality: N/A;   SIGMOIDECTOMY  2010   diverticular disease   VENTRAL HERNIA REPAIR N/A 11/15/2015   Procedure: LAPAROSCOPIC VENTRAL WALL HERNIA REPAIR;  Surgeon: [name], MD;  Location: [facility];  Service: General;  Laterality: N/A;    Family History  Problem Relation Age of Onset   Heart disease Mother    Hypertension Mother    Dementia Mother    Heart disease Father    COPD Father    Hypertension Father    Heart disease Brother    Other Brother        knee replacement; hip replacement   Other Brother        cant brathe when laying down   Breast cancer Neg Hx    Colon cancer Neg Hx     Allergies  Allergen Reactions   Almond Oil Anaphylaxis, Shortness Of Breath and Swelling   Morphine And Related Shortness Of Breath and Other (See Comments)    Pt. States while in the hospital it affected her breathing, O2 dropped to the 70's   Atorvastatin Other (See Comments)    Leg weakness   Ceclor [Cefaclor] Nausea And Vomiting   Sulfa Antibiotics Nausea And Vomiting   Ciprofloxacin Other (See Comments)    Makes joints and muscles ache   Levaquin [Levofloxacin] Other (See Comments)    Body aches   Losartan Other (See Comments)    Weakness    Statins Other (See Comments)    Leg and body weakness    Current Outpatient Medications on File Prior to Visit  Medication Sig Dispense Refill    acetaminophen (TYLENOL) 500 MG tablet Take 500 mg by mouth every 6 (six) hours as needed for moderate pain.     albuterol (VENTOLIN HFA) 108 (90 Base) MCG/ACT inhaler Inhale 2 puffs into the lungs every 6 (six) hours as needed. 17 g 2   BAYER CONTOUR NEXT TEST test strip 1 each by Other route daily. As directed     budesonide (  PULMICORT) 0.5 MG/2ML nebulizer solution Take 2 mLs (0.5 mg total) by nebulization 2 (two) times daily. 360 mL 3   buPROPion (WELLBUTRIN XL) 300 MG 24 hr tablet TAKE 1 TABLET BY MOUTH DAILY 90 tablet 2   Cholecalciferol (VITAMIN D-3) 5000 UNITS TABS Take 5,000 Units by mouth every other day.      colchicine 0.6 MG tablet Take 1 tablet (0.6 mg total) by mouth daily as needed (for gout). 30 tablet 1   dicyclomine (BENTYL) 20 MG tablet TAKE 1 TABLET UP TO FOUR TIMES A DAY FOR GI CRAMPING, PAIN, AND NAUSEA/VOMITING AS NEEDED 60 tablet 1   Dulaglutide (TRULICITY) 4.5 VV/6.1YW SOPN Inject 4.5 mg as directed once a week. 6 mL 1   DULoxetine (CYMBALTA) 60 MG capsule TAKE 1 CAPSULE BY MOUTH ONCE DAILY. 90 capsule 0   furosemide (LASIX) 20 MG tablet Take 3 tablets (60 mg total) by mouth 2 (two) times daily. 180 tablet 11   gabapentin (NEURONTIN) 100 MG capsule TAKE 3 CAPSULES BY MOUTH AT BEDTIME. 270 capsule 1   GLOBAL EASE INJECT PEN NEEDLES 32G X 4 MM MISC USE AS DIRECTED WITH VICTOZA AND TRESIBA. 100 each 4   insulin degludec (TRESIBA FLEXTOUCH) 200 UNIT/ML FlexTouch Pen INJECT 90 UNITS UNDER SKIN ONCE DAILY 3 mL 1   ipratropium-albuterol (DUONEB) 0.5-2.5 (3) MG/3ML SOLN Take 3 mLs by nebulization every 6 (six) hours as needed (wheezing).     Iron, Ferrous Sulfate, 325 (65 Fe) MG TABS Take 325 mg by mouth daily. 30 tablet 3   mesalamine (LIALDA) 1.2 g EC tablet Take 1.2 g by mouth 2 (two) times daily.     metolazone (ZAROXOLYN) 5 MG tablet TAKE 1 TABLET BY MOUTH AS NEEDED. 30 tablet 1   MICROLET LANCETS MISC 1 each by Other route. As directed     nystatin (MYCOSTATIN/NYSTOP) powder  Apply 1 application topically 3 (three) times daily. 60 g 1   ondansetron (ZOFRAN) 4 MG tablet Take 1 tablet (4 mg total) by mouth every 6 (six) hours. 12 tablet 0   oxyCODONE-acetaminophen (PERCOCET/ROXICET) 5-325 MG tablet Take 1 tablet by mouth every 6 (six) hours as needed for severe pain. 15 tablet 0   pantoprazole (PROTONIX) 40 MG tablet TAKE 1 TABLET BY MOUTH AT BEDTIME 90 tablet 1   polyethylene glycol (MIRALAX / GLYCOLAX) packet Take 17 g by mouth daily as needed for mild constipation.     potassium chloride (MICRO-K) 10 MEQ CR capsule TAKE 5 CAPSULES (50 MEQ TOTAL) BY MOUTH 2 (TWO) TIMES DAILY 300 capsule 5   propranolol (INDERAL) 10 MG tablet TAKE 1 TABLET BY MOUTH 2 TIMES DAILY 180 tablet 0   revefenacin (YUPELRI) 175 MCG/3ML nebulizer solution Take 3 mLs (175 mcg total) by nebulization daily. 90 mL 3   rOPINIRole (REQUIP) 4 MG tablet TAKE 1 TABLET BY MOUTH TWICE DAILY 180 tablet 0   spironolactone (ALDACTONE) 25 MG tablet TAKE 1 TABLET BY MOUTH DAILY. 30 tablet 2   traMADol (ULTRAM) 50 MG tablet Take 1 tablet (50 mg total) by mouth 3 (three) times daily as needed. 30 tablet 1   UNABLE TO FIND CPAP- At bedtime     No current facility-administered medications on file prior to visit.    BP (!) 150/84 (BP Location: Left Arm, Patient Position: Sitting, Cuff Size: Large)   Pulse 88   Temp 97.9 F (36.6 C) (Temporal)   Wt 268 lb 1 oz (121.6 kg)   SpO2 95%   BMI 44.61  kg/m  Objective:   Physical Exam Constitutional:      General: She is in acute distress.     Appearance: She is not toxic-appearing or diaphoretic.     Comments: Mild to moderate distress when ambulatory. Speaking in short complete sentences at rest.  Cardiovascular:     Rate and Rhythm: Normal rate and regular rhythm.  Pulmonary:     Breath sounds: Examination of the left-upper field reveals rhonchi. Rhonchi present. No wheezing.     Comments: Pulse oxygenation decreased to 89% upon ambulation. Pulse  oxygenation increased and remained between 94-96% on room air at rest. Neurological:     Mental Status: She is alert and oriented to person, place, and time.          Assessment & Plan:      This visit occurred during the SARS-CoV-2 public health emergency.  Safety protocols were in place, including screening questions prior to the visit, additional usage of staff PPE, and extensive cleaning of exam room while observing appropriate contact time as indicated for disinfecting solutions.

## 2021-02-07 NOTE — Patient Instructions (Addendum)
Complete xray(s) and labs prior to leaving today. I will notify you of your results once received.  Start Doxycycline antibiotic for the infection. Take 1 tablet by mouth twice daily for 10 days.  Start prednisone. Take 2 tablets by mouth once daily for five days, then 1 tablet once daily for five days.  Increase your Tresiba insulin by 10 units if your blood sugars remain above 200.   Please go to the hospital if you get worse and/or if you are not getting better soon.  It was a pleasure meeting you!

## 2021-02-07 NOTE — Chronic Care Management (AMB) (Signed)
Chronic Care Management   CCM RN Visit Note  02/07/2021 Name: Kathleen Hatfield MRN: [id number] DOB: 1955/10/04  Subjective: Kathleen Hatfield is a 65 y.o. year old female who is a primary care patient of [name], MD. The care management team was consulted for assistance with disease management and care coordination needs.    Engaged with patient by telephone for follow up visit in response to provider referral for case management and/or care coordination services.   Consent to Services:  The patient was given information about Chronic Care Management services, agreed to services, and gave verbal consent prior to initiation of services.  Please see initial visit note for detailed documentation.   Patient agreed to services and verbal consent obtained.   Assessment: Review of patient past medical history, allergies, medications, health status, including review of consultants reports, laboratory and other test data, was performed as part of comprehensive evaluation and provision of chronic care management services.   SDOH (Social Determinants of Health) assessments and interventions performed:    CCM Care Plan  Allergies  Allergen Reactions   Almond Oil Anaphylaxis, Shortness Of Breath and Swelling   Morphine And Related Shortness Of Breath and Other (See Comments)    Pt. States while in the hospital it affected her breathing, O2 dropped to the 70's   Atorvastatin Other (See Comments)    Leg weakness   Ceclor [Cefaclor] Nausea And Vomiting   Sulfa Antibiotics Nausea And Vomiting   Ciprofloxacin Other (See Comments)    Makes joints and muscles ache   Levaquin [Levofloxacin] Other (See Comments)    Body aches   Losartan Other (See Comments)    Weakness    Statins Other (See Comments)    Leg and body weakness    Outpatient Encounter Medications as of 02/07/2021  Medication Sig   acetaminophen (TYLENOL) 500 MG tablet Take 500 mg by mouth every 6 (six) hours as needed for moderate  pain.   albuterol (VENTOLIN HFA) 108 (90 Base) MCG/ACT inhaler Inhale 2 puffs into the lungs every 6 (six) hours as needed.   BAYER CONTOUR NEXT TEST test strip 1 each by Other route daily. As directed   budesonide (PULMICORT) 0.5 MG/2ML nebulizer solution Take 2 mLs (0.5 mg total) by nebulization 2 (two) times daily.   buPROPion (WELLBUTRIN XL) 300 MG 24 hr tablet TAKE 1 TABLET BY MOUTH DAILY   Cholecalciferol (VITAMIN D-3) 5000 UNITS TABS Take 5,000 Units by mouth every other day.    colchicine 0.6 MG tablet Take 1 tablet (0.6 mg total) by mouth daily as needed (for gout).   dicyclomine (BENTYL) 20 MG tablet TAKE 1 TABLET UP TO FOUR TIMES A DAY FOR GI CRAMPING, PAIN, AND NAUSEA/VOMITING AS NEEDED   Dulaglutide (TRULICITY) 4.5 EP/3.2RJ SOPN Inject 4.5 mg as directed once a week.   DULoxetine (CYMBALTA) 60 MG capsule TAKE 1 CAPSULE BY MOUTH ONCE DAILY.   furosemide (LASIX) 20 MG tablet Take 3 tablets (60 mg total) by mouth 2 (two) times daily.   gabapentin (NEURONTIN) 100 MG capsule TAKE 3 CAPSULES BY MOUTH AT BEDTIME.   GLOBAL EASE INJECT PEN NEEDLES 32G X 4 MM MISC USE AS DIRECTED WITH VICTOZA AND TRESIBA.   insulin degludec (TRESIBA FLEXTOUCH) 200 UNIT/ML FlexTouch Pen INJECT 90 UNITS UNDER SKIN ONCE DAILY   ipratropium-albuterol (DUONEB) 0.5-2.5 (3) MG/3ML SOLN Take 3 mLs by nebulization every 6 (six) hours as needed (wheezing).   Iron, Ferrous Sulfate, 325 (65 Fe) MG TABS Take 325  mg by mouth daily.   mesalamine (LIALDA) 1.2 g EC tablet Take 1.2 g by mouth 2 (two) times daily.   metolazone (ZAROXOLYN) 5 MG tablet TAKE 1 TABLET BY MOUTH AS NEEDED.   MICROLET LANCETS MISC 1 each by Other route. As directed   nystatin (MYCOSTATIN/NYSTOP) powder Apply 1 application topically 3 (three) times daily.   ondansetron (ZOFRAN) 4 MG tablet Take 1 tablet (4 mg total) by mouth every 6 (six) hours.   oxyCODONE-acetaminophen (PERCOCET/ROXICET) 5-325 MG tablet Take 1 tablet by mouth every 6 (six) hours as  needed for severe pain.   pantoprazole (PROTONIX) 40 MG tablet TAKE 1 TABLET BY MOUTH AT BEDTIME   polyethylene glycol (MIRALAX / GLYCOLAX) packet Take 17 g by mouth daily as needed for mild constipation.   potassium chloride (MICRO-K) 10 MEQ CR capsule TAKE 5 CAPSULES (50 MEQ TOTAL) BY MOUTH 2 (TWO) TIMES DAILY   propranolol (INDERAL) 10 MG tablet TAKE 1 TABLET BY MOUTH 2 TIMES DAILY   revefenacin (YUPELRI) 175 MCG/3ML nebulizer solution Take 3 mLs (175 mcg total) by nebulization daily.   rOPINIRole (REQUIP) 4 MG tablet TAKE 1 TABLET BY MOUTH TWICE DAILY   spironolactone (ALDACTONE) 25 MG tablet TAKE 1 TABLET BY MOUTH DAILY.   traMADol (ULTRAM) 50 MG tablet Take 1 tablet (50 mg total) by mouth 3 (three) times daily as needed.   UNABLE TO FIND CPAP- At bedtime   [DISCONTINUED] predniSONE (DELTASONE) 10 MG tablet Take 4 tablets (40 mg total) by mouth daily for 5 days.   No facility-administered encounter medications on file as of 02/07/2021.    Patient Active Problem List   Diagnosis Date Noted   Rash 09/27/2020   COPD exacerbation ([place]) 09/21/2020   Acute exacerbation of COPD with asthma ([place]) 09/19/2020   Mixed diabetic hyperlipidemia associated with type 2 diabetes mellitus ([place]) 09/19/2020   Pain of foot 06/20/2020   Medicare annual wellness visit, initial 06/20/2020   Constipation 06/11/2020   Diverticulosis of colon 06/11/2020   Irritable bowel syndrome 19/16/6060   Periumbilical pain 04/59/9774   RLS (restless legs syndrome)    Acute asthma exacerbation 01/09/2020   Asthma exacerbation 01/08/2020   Muscle weakness 01/08/2020   Grade I diastolic dysfunction 14/23/9532   Statin myopathy 10/10/2019   Advance care planning 06/14/2019   RLQ abdominal pain 06/14/2019   COPD (chronic obstructive pulmonary disease) ([place]) 06/14/2019   Leukocytosis 06/14/2019   Hypercalcemia 06/14/2019   Gout 06/14/2019   Type 2 diabetes mellitus with hyperglycemia, without long-term current use of  insulin (HCC)    COPD with acute exacerbation ([place]) 05/03/2017   Acute renal failure superimposed on stage 2 chronic kidney disease ([place]) 04/02/2016   Diabetes mellitus without complication (HCC)    CKD (chronic kidney disease)    Hypersomnia with sleep apnea 09/16/2015   Fatigue 06/07/2015   Morbid obesity ([place]) 06/07/2015   Chronic diastolic heart failure (HCC)    Pneumonia of both lower lobes due to infectious organism 02/09/2013   OSA on CPAP 08/23/2012   Essential hypertension    Depression    GERD without esophagitis    Hyperlipidemia    Ulcerative colitis ([place])    Patellar tendinitis 05/25/2011   Knee pain 05/25/2011   Myofascial pain 05/25/2011   Cervicalgia 05/25/2011    Conditions to be addressed/monitored:COPD and DMII   Care Plan : [facility] plan of care  Updates made by Dannielle Karvonen, RN since 02/07/2021 12:00 AM   Problem: chronic disease management, education  and/ or care coordination   Priority: High   Long-Range Goal: Development of plan of care to address chronic disease management and / or care coordination needs.   Start Date: 02/07/2021  Expected End Date: 05/06/2021  Priority: High  Note:   Current Barriers:  Knowledge Deficits related to plan of care for management of COPD and DMII  Chronic Disease Management support and education needs related to COPD and DMII Patient states she is still not feeling well from her recent urgent care visit on 02/04/2021. She states she was seen due to cough, chest tightness,  and cold symptoms.  Patient states she was prescribed prednisone which she has completed. She reports her cough is productive with [place] phlegm and states is using her nebulizer 3 times per day and inhaler as needed.  Patient states she is not checking her blood sugars because she has not felt like it.  Continues to take her insulin.  RNCM advised patient to call her providers office today to report ongoing symptoms. Patient verbalized understanding.  RNCM  Clinical Goal(s):  Patient will verbalize basic understanding of COPD and DMII disease process and self health management plan as evidenced by patient report and/ or notation in chart.  take all medications exactly as prescribed and will call provider for medication related questions as evidenced by patient self report and/ or notation in chart    attend all scheduled medical appointments:   as evidenced by patient self report and/ or notation in chart.         continue to work with Consulting civil engineer and/or Social Worker to address care management and care coordination needs related to COPD and DMII as evidenced by adherence to CM Team Scheduled appointments     through collaboration with Consulting civil engineer, provider, and care team.   Interventions: 1:1 collaboration with primary care provider regarding development and update of comprehensive plan of care as evidenced by provider attestation and co-signature Inter-disciplinary care team collaboration (see longitudinal plan of care) Evaluation of current treatment plan related to  self management and patient's adherence to plan as established by provider   COPD Interventions:  (Status:  Goal on track:  Yes.) Long Term Goal Provided patient with basic written and verbal COPD education on self care/management/and exacerbation prevention Advised patient to self assesses COPD action plan zone and make appointment with provider if in the yellow zone for 48 hours without improvement Provided education about and advised patient to utilize infection prevention strategies to reduce risk of respiratory infection Discussed the importance of adequate rest and management of fatigue with COPD Advised patient to call provider office to report ongoing symptoms, productive cough, general malaise    Diabetes Interventions:  (Status:  Goal on track:  Yes.) Long Term Goal Assessed patient's understanding of A1c goal: <7% Reviewed medications with patient and discussed  importance of medication adherence Discussed plans with patient for ongoing care management follow up and provided patient with direct contact information for care management team Provided patient with written educational materials related to hypo and hyperglycemia and importance of correct treatment Reviewed scheduled/upcoming provider appointments including:   Lab Results  Component Value Date   HGBA1C 9.4 (H) 11/28/2020     Patient Goals/Self-Care Activities: Call your doctor to report ongoing cold/ cough/ fatigue symptoms today Call pharmacy for medication refills 3-7 days in advance of running out of medications  - Continue to take your medications as prescribed and rinse mouth out after use on inhalers -  keep follow-up appointments with your providers - listen for public air quality announcements every day -Identify and avoid symptom triggers, such as second hand smoke, virus, weather change, emotional upset, heavy exercise,  and environmental allergens that could exacerbate your COPD or asthma.  - Continue to wear your CPAP machine nightly as advised by your doctor.  -Schedule follow up appointments with your pulmonologist and cardiologist.  - Continue to check blood sugar at prescribed times.  Check blood sugar if you feel it is too high or too low and take your blood sugar readings or monitor to your doctor appointment.  - Plan to eat low carbohydrate and low salt meals, watch portion sizes and avoid sugar sweetened drinks.  - continue to follow a realistic goal ( set small achieving goals for weight lost, example 5 lbs in 1 month, meet that goal then set another attainable goal) - Incorporate exercise into your daily routine. (review exercise booklet regarding chair exercises sent to you in the mail)     Plan:The patient has been provided with contact information for the care management team and has been advised to call with any health related questions or concerns.  The care  management team will reach out to the patient again over the next 2-3 months .  Quinn Plowman RN,BSN,CCM RN Case Manager [place]  [phone]

## 2021-02-07 NOTE — Assessment & Plan Note (Addendum)
Symptoms today representative of COPD exacerbation.  Stat chest x-ray ordered. Labs including BNP and CBC with differential ordered.  Prescription for prednisone 40 mg x 5 days, 20 mg x 5 days sent to pharmacy.  Prescription for doxycycline 100 mg twice daily x10 days sent to pharmacy.  We discussed to adjust her insulin up by 10 units for glucose readings consistently at or above 200 or greater.  While resting she does appear stable for outpatient treatment, however, we discussed strict ED precautions and she and her husband both verbalized understanding.  Reviewed plan with PCP who agrees.

## 2021-02-07 NOTE — Patient Instructions (Signed)
Visit Information  Thank you for taking time to visit with me today. Please don't hesitate to contact me if I can be of assistance to you before our next scheduled telephone appointment.  Patient Goals/Self-Care Activities: Call your doctor to report ongoing cold/ cough/ fatigue symptoms Call pharmacy for medication refills 3-7 days in advance of running out of medications  - Continue to take your medications as prescribed and rinse mouth out after use on inhalers - keep follow-up appointments with your providers - listen for public air quality announcements every day -Identify and avoid symptom triggers, such as second hand smoke, virus, weather change, emotional upset, heavy exercise,  and environmental allergens that could exacerbate your COPD or asthma.  - Continue to wear your CPAP machine nightly as advised by your doctor.  -Schedule follow up appointments with your pulmonologist and cardiologist.  - Continue to check blood sugar at prescribed times.  Check blood sugar if you feel it is too high or too low and take your blood sugar readings or monitor to your doctor appointment.  - Plan to eat low carbohydrate and low salt meals, watch portion sizes and avoid sugar sweetened drinks.  - continue to follow a realistic goal ( set small achieving goals for weight lost, example 5 lbs in 1 month, meet that goal then set another attainable goal) - Incorporate exercise into your daily routine. (review exercise booklet regarding chair exercises sent to you in the mail)  Our next appointment is by telephone on April 25, 2021 at 2:30 pm  Please call the care guide team at [phone] if you need to cancel or reschedule your appointment.   If you are experiencing a Mental Health or [place] or need someone to talk to, please call the Suicide and Crisis Lifeline: 988 call the [place] National Suicide Prevention Lifeline: 848-002-7814 or TTY: (830)678-0283 TTY 873-736-4339) to talk  to a trained counselor call 1-800-273-TALK (toll free, 24 hour hotline)   Patient verbalizes understanding of instructions provided today and agrees to view in [place].   Quinn Plowman RN,BSN,CCM RN Case Manager [place]  [phone]

## 2021-02-07 NOTE — Telephone Encounter (Signed)
Pt has already seen Gentry Fitz NP today. Sending note to Gentry Fitz NP.

## 2021-02-07 NOTE — Telephone Encounter (Signed)
[facility] [place] Day - Client TELEPHONE ADVICE RECORD [facility] Patient Name: Kathleen Hatfield Gender: Female DOB: [birth year] Age: 65 Y 26 D Return Phone Number: [phone] (Primary) Address: City/ State/ Zip: [place]  [address] Client [place] Day - Client Client Site [place] - Day Provider Renford Dills - MD Contact Type Call Who Is Calling Patient / Member / Family / Caregiver Call Type Triage / Clinical Caller Name Verdene Lennert Relationship To Patient Provider Return Phone Number [phone] (Primary) Chief Complaint CHEST PAIN - pain, pressure, heaviness or tightness Reason for Call Symptomatic / Request for [place] states patient is having tightness in chest and shortness of breath. Translation No Nurse Assessment Nurse: Glean Salvo, RN, Magda Paganini Date/Time Eilene Ghazi Time): 02/07/2021 1:51:25 PM Confirm and document reason for call. If symptomatic, describe symptoms. ---Caller reports she is having cough, chest congestion, sinus congestion and pressure in her ears. Sx started Monday. Was seen at [facility] and given prednisone but sx are not improving. No fever Does the patient have any new or worsening symptoms? ---Yes Will a triage be completed? ---Yes Related visit to physician within the last 2 weeks? ---Yes Does the PT have any chronic conditions? (i.e. diabetes, asthma, this includes High risk factors for pregnancy, etc.) ---Yes List chronic conditions. ---COPD, DM Is this a behavioral health or substance abuse call? ---No Guidelines Guideline Title Affirmed Question Affirmed Notes Nurse Date/Time (Eastern Time) Cough - Acute Productive SEVERE coughing spells (e.g., whooping sound after coughing, vomiting after coughing) Glean Salvo, RN, Magda Paganini 02/07/2021 1:54:29 PM PLEASE NOTE: All timestamps contained within this report are represented as [place] Standard  Time. CONFIDENTIALTY NOTICE: This fax transmission is intended only for the addressee. It contains information that is legally privileged, confidential or otherwise protected from use or disclosure. If you are not the intended recipient, you are strictly prohibited from reviewing, disclosing, copying using or disseminating any of this information or taking any action in reliance on or regarding this information. If you have received this fax in error, please notify us immediately by telephone so that we can arrange for its return to [place]. Phone: [phone], Toll-Free: [phone], Fax: [phone] Page: 2 of 2 Call Id: [id number] [place]. Time Eilene Ghazi Time) Disposition Final User 02/07/2021 1:50:24 PM Send to Urgent Queue Eduard Clos 02/07/2021 2:00:49 PM See PCP within 24 Hours Yes Glean Salvo, RN, Christa See Disagree/Comply Comply Caller Understands Yes PreDisposition Call Doctor Care Advice Given Per Guideline SEE PCP WITHIN 24 HOURS: * IF OFFICE WILL BE OPEN: You need to be examined within the next 24 hours. Call your doctor (or NP/PA) when the office opens and make an appointment. COUGHING SPELLS: * Drink warm fluids. Inhale warm mist. This can help relax the airway and also loosen up phlegm. CARE ADVICE given per Cough - Acute Productive (Adult) guideline. * You become worse * Difficulty breathing occurs CALL BACK IF: HUMIDIFIER: * If the air is dry, use a humidifier in the bedroom. Referrals REFERRED TO PCP OFFIC

## 2021-02-08 ENCOUNTER — Emergency Department (HOSPITAL_COMMUNITY)
Admission: EM | Admit: 2021-02-08 | Discharge: 2021-02-08 | Disposition: A | Discharge status: Home / Self Care | Payer: Medicare HMO | Attending: Emergency Medicine | Admitting: Emergency Medicine

## 2021-02-08 DIAGNOSIS — E1122 Type 2 diabetes mellitus with diabetic chronic kidney disease: Secondary | ICD-10-CM | POA: Insufficient documentation

## 2021-02-08 DIAGNOSIS — I130 Hypertensive heart and chronic kidney disease with heart failure and stage 1 through stage 4 chronic kidney disease, or unspecified chronic kidney disease: Secondary | ICD-10-CM | POA: Insufficient documentation

## 2021-02-08 DIAGNOSIS — N182 Chronic kidney disease, stage 2 (mild): Secondary | ICD-10-CM | POA: Insufficient documentation

## 2021-02-08 DIAGNOSIS — Z951 Presence of aortocoronary bypass graft: Secondary | ICD-10-CM | POA: Insufficient documentation

## 2021-02-08 DIAGNOSIS — Z7984 Long term (current) use of oral hypoglycemic drugs: Secondary | ICD-10-CM | POA: Diagnosis not present

## 2021-02-08 DIAGNOSIS — J441 Chronic obstructive pulmonary disease with (acute) exacerbation: Secondary | ICD-10-CM | POA: Diagnosis not present

## 2021-02-08 DIAGNOSIS — I5032 Chronic diastolic (congestive) heart failure: Secondary | ICD-10-CM | POA: Diagnosis not present

## 2021-02-08 DIAGNOSIS — Z7951 Long term (current) use of inhaled steroids: Secondary | ICD-10-CM | POA: Insufficient documentation

## 2021-02-08 DIAGNOSIS — Z79899 Other long term (current) drug therapy: Secondary | ICD-10-CM | POA: Insufficient documentation

## 2021-02-08 DIAGNOSIS — R0602 Shortness of breath: Secondary | ICD-10-CM | POA: Diagnosis present

## 2021-02-08 DIAGNOSIS — E1140 Type 2 diabetes mellitus with diabetic neuropathy, unspecified: Secondary | ICD-10-CM | POA: Diagnosis not present

## 2021-02-08 DIAGNOSIS — J45901 Unspecified asthma with (acute) exacerbation: Secondary | ICD-10-CM | POA: Diagnosis not present

## 2021-02-08 LAB — CBC WITH DIFFERENTIAL/PLATELET
Absolute Monocytes: 685 cells/uL (ref 200–950)
Basophils Absolute: 43 cells/uL (ref 0–200)
Basophils Relative: 0.4 %
Eosinophils Absolute: 21 cells/uL (ref 15–500)
Eosinophils Relative: 0.2 %
HCT: 41.4 % (ref 35.0–45.0)
Hemoglobin: 13.5 g/dL (ref 11.7–15.5)
Lymphs Abs: 1198 cells/uL (ref 850–3900)
MCH: 30.2 pg (ref 27.0–33.0)
MCHC: 32.6 g/dL (ref 32.0–36.0)
MCV: 92.6 fL (ref 80.0–100.0)
MPV: 11 fL (ref 7.5–12.5)
Monocytes Relative: 6.4 %
Neutro Abs: 8753 cells/uL — ABNORMAL HIGH (ref 1500–7800)
Neutrophils Relative %: 81.8 %
Platelets: 301 10*3/uL (ref 140–400)
RBC: 4.47 10*6/uL (ref 3.80–5.10)
RDW: 13.1 % (ref 11.0–15.0)
Total Lymphocyte: 11.2 %
WBC: 10.7 10*3/uL (ref 3.8–10.8)

## 2021-02-08 MED ORDER — AMOXICILLIN-POT CLAVULANATE 875-125 MG PO TABS: 1.0000 | ORAL_TABLET | Freq: Two times a day (BID) | ORAL | 0 refills | Status: DC

## 2021-02-08 MED ORDER — FLUTICASONE PROPIONATE 50 MCG/ACT NA SUSP: 2.0000 | Freq: Every day | NASAL | 0 refills | Status: DC

## 2021-02-08 MED ORDER — IPRATROPIUM-ALBUTEROL 0.5-2.5 (3) MG/3ML IN SOLN
3.0000 mL | Freq: Once | RESPIRATORY_TRACT | Status: AC
  Administered 2021-02-08: 3 mL via RESPIRATORY_TRACT
  Filled 2021-02-08: qty 3

## 2021-02-08 NOTE — Discharge Instructions (Signed)
You are given DuoNeb treatment and ambulated without your oxygen level dropping.  Given that your symptoms are not worse than they were yesterday when you are seen in clinic, we did discuss repeating chest x-ray but given that you have had 2 in the past week and both were without pneumonia we decided this would not be helpful.  You also had blood work done in your office yesterday which I reviewed and does not show any signs of infection.  You were started on doxycycline yesterday.  I would like for you to continue that and I will add on Augmentin.  I have sent this to your pharmacy.  As discussed I have sent in Flonase for sinus congestion.  I want you to pick up Allegra or Zyrtec over-the-counter.  If your shortness of breath worsens please return to the emergency room.  Otherwise you follow-up with your primary care provider for follow-up once you have had a few days with antibiotics for reevaluation.

## 2021-02-08 NOTE — ED Triage Notes (Addendum)
Patient reports chest congestion/tightness/cough x 1 week. Was told to come to ER if did not get better. Patient reports history of asthma, copd, chf, history of hospitalizations with FLU, history of bipap needed. Husband tested positive for flu A a week ago. Patient says she just recovered from double pneumonia.

## 2021-02-08 NOTE — ED Provider Notes (Signed)
[facility] DEPT Provider Note   CSN: [id number] Arrival date & time: 02/08/21  1100     History Chief Complaint  Patient presents with   Shortness of Breath    Kathleen Hatfield is a 65 y.o. female.  65 year old female presents today for evaluation of shortness of breath.  Patient reports this has been ongoing for about a week.  She was seen in urgent care on 11/29 followed by PCP clinic yesterday.  Patient was started on prednisone and doxycycline.  She has had 1 day of doxycycline so far.  She has had blood work done at her PCP clinic yesterday as well as a chest x-ray.  Her chest x-ray did not show pneumonia but she does have underlying history of COPD.  She was told if her symptoms do not improve by today that she needs to be evaluated in the emergency room.  She denies any worsening of her symptoms since yesterday.  She does not wear oxygen at home.  She denies chest pain, lightheadedness, fever, or nausea.  She endorses sinus congestion and has been taking Mucinex with transient relief.  The history is provided by the patient. No language interpreter was used.      Past Medical History:  Diagnosis Date   Anemia    Arthritis    Asthma    Chronic diastolic CHF 40/9811   Echocardiogram 05/2019: EF 70, no RWMA, mild LVH, Gr 1 DD, normal RVSF, severe LVH, borderline asc Aorta (39 mm)   CKD (chronic kidney disease)    Depression    Diabetes mellitus without complication (HCC)    Diverticulitis    Dyspnea    with exertion   GERD (gastroesophageal reflux disease)    History of blood transfusion    Hyperlipidemia    cannot tolerate statins   Hypertension    patient states she has never had high blood pressure.    Nuclear stress test    Myoview 05/2019: EF 83, no ischemia or infarction; Low Risk   Peripheral neuropathy    Pneumonia    PONV (postoperative nausea and vomiting)    RLS (restless legs syndrome)    Sleep apnea    uses CPAP   Ulcerative  colitis [facility])    dr Collene Mares    Patient Active Problem List   Diagnosis Date Noted   Rash 09/27/2020   COPD exacerbation ([place]) 09/21/2020   Acute exacerbation of COPD with asthma ([place]) 09/19/2020   Mixed diabetic hyperlipidemia associated with type 2 diabetes mellitus ([place]) 09/19/2020   Pain of foot 06/20/2020   Medicare annual wellness visit, initial 06/20/2020   Constipation 06/11/2020   Diverticulosis of colon 06/11/2020   Irritable bowel syndrome 91/47/8295   Periumbilical pain 62/13/0865   RLS (restless legs syndrome)    Acute asthma exacerbation 01/09/2020   Asthma exacerbation 01/08/2020   Muscle weakness 01/08/2020   Grade I diastolic dysfunction 78/46/9629   Statin myopathy 10/10/2019   Advance care planning 06/14/2019   RLQ abdominal pain 06/14/2019   COPD (chronic obstructive pulmonary disease) ([place]) 06/14/2019   Leukocytosis 06/14/2019   Hypercalcemia 06/14/2019   Gout 06/14/2019   Type 2 diabetes mellitus with hyperglycemia, without long-term current use of insulin (HCC)    COPD with acute exacerbation ([place]) 05/03/2017   Acute renal failure superimposed on stage 2 chronic kidney disease ([place]) 04/02/2016   Diabetes mellitus without complication (HCC)    CKD (chronic kidney disease)    Hypersomnia with sleep apnea 09/16/2015  Fatigue 06/07/2015   Morbid obesity ([place]) 06/07/2015   Chronic diastolic heart failure (HCC)    Pneumonia of both lower lobes due to infectious organism 02/09/2013   OSA on CPAP 08/23/2012   Essential hypertension    Depression    GERD without esophagitis    Hyperlipidemia    Ulcerative colitis ([place])    Patellar tendinitis 05/25/2011   Knee pain 05/25/2011   Myofascial pain 05/25/2011   Cervicalgia 05/25/2011    Past Surgical History:  Procedure Laterality Date   ABDOMINAL HYSTERECTOMY     APPENDECTOMY     BREAST BIOPSY     BREAST SURGERY     left biopsy   CARDIAC CATHETERIZATION     CESAREAN SECTION     CHOLECYSTECTOMY      HERNIA REPAIR     INSERTION OF MESH N/A 11/15/2015   Procedure: INSERTION OF MESH;  Surgeon: [name], MD;  Location: [facility] ORS;  Service: General;  Laterality: N/A;   LAPAROSCOPIC LYSIS OF ADHESIONS N/A 11/15/2015   Procedure: LAPAROSCOPIC LYSIS OF ADHESIONS;  Surgeon: [name], MD;  Location: [facility] ORS;  Service: General;  Laterality: N/A;   RIGHT HEART CATHETERIZATION N/A 02/27/2013   Procedure: RIGHT HEART CATH;  Surgeon: Blane Ohara, MD;  Location: [facility] CATH LAB;  Service: Cardiovascular;  Laterality: N/A;   RIGHT HEART CATHETERIZATION N/A 12/14/2013   Procedure: RIGHT HEART CATH;  Surgeon: Larey Dresser, MD;  Location: [facility] CATH LAB;  Service: Cardiovascular;  Laterality: N/A;   SIGMOIDECTOMY  2010   diverticular disease   VENTRAL HERNIA REPAIR N/A 11/15/2015   Procedure: LAPAROSCOPIC VENTRAL WALL HERNIA REPAIR;  Surgeon: [name], MD;  Location: [facility] ORS;  Service: General;  Laterality: N/A;     OB History     Gravida  2   Para  1   Term      Preterm      AB  1   Living         SAB      IAB      Ectopic      Multiple      Live Births              Family History  Problem Relation Age of Onset   Heart disease Mother    Hypertension Mother    Dementia Mother    Heart disease Father    COPD Father    Hypertension Father    Heart disease Brother    Other Brother        knee replacement; hip replacement   Other Brother        cant brathe when laying down   Breast cancer Neg Hx    Colon cancer Neg Hx     Social History   Tobacco Use   Smoking status: Never   Smokeless tobacco: Never  Vaping Use   Vaping Use: Never used  Substance Use Topics   Alcohol use: No   Drug use: No    Home Medications Prior to Admission medications   Medication Sig Start Date End Date Taking? Authorizing Provider  acetaminophen (TYLENOL) 500 MG tablet Take 500 mg by mouth every 6 (six) hours as needed for moderate pain.    [provider]  albuterol  (VENTOLIN HFA) 108 (90 Base) MCG/ACT inhaler Inhale 2 puffs into the lungs every 6 (six) hours as needed. 02/16/20   [name], MD  BAYER CONTOUR NEXT TEST test strip 1 each by Other  route daily. As directed 07/30/15   [provider]  budesonide (PULMICORT) 0.5 MG/2ML nebulizer solution Take 2 mLs (0.5 mg total) by nebulization 2 (two) times daily. 05/23/20   [name], MD  buPROPion (WELLBUTRIN XL) 300 MG 24 hr tablet TAKE 1 TABLET BY MOUTH DAILY 09/25/20   [name], MD  Cholecalciferol (VITAMIN D-3) 5000 UNITS TABS Take 5,000 Units by mouth every other day.     [provider]  colchicine 0.6 MG tablet Take 1 tablet (0.6 mg total) by mouth daily as needed (for gout). 08/15/20   [name], MD  dicyclomine (BENTYL) 20 MG tablet TAKE 1 TABLET UP TO FOUR TIMES A DAY FOR GI CRAMPING, PAIN, AND NAUSEA/VOMITING AS NEEDED 11/26/20   [name], MD  doxycycline (VIBRA-TABS) 100 MG tablet Take 1 tablet (100 mg total) by mouth 2 (two) times daily. 02/07/21   Pleas Koch, NP  Dulaglutide (TRULICITY) 4.5 HL/4.5GY SOPN Inject 4.5 mg as directed once a week. 02/04/21   [name], MD  DULoxetine (CYMBALTA) 60 MG capsule TAKE 1 CAPSULE BY MOUTH ONCE DAILY. 01/01/21   [name], MD  furosemide (LASIX) 20 MG tablet Take 3 tablets (60 mg total) by mouth 2 (two) times daily. 05/13/20   Richardson Dopp T, PA-C  gabapentin (NEURONTIN) 100 MG capsule TAKE 3 CAPSULES BY MOUTH AT BEDTIME. 09/25/20   [name], MD  GLOBAL EASE INJECT PEN NEEDLES 32G X 4 MM MISC USE AS DIRECTED WITH VICTOZA AND TRESIBA. 08/12/20   [name], MD  insulin degludec (TRESIBA FLEXTOUCH) 200 UNIT/ML FlexTouch Pen INJECT 90 UNITS UNDER SKIN ONCE DAILY 11/26/20   [name], MD  ipratropium-albuterol (DUONEB) 0.5-2.5 (3) MG/3ML SOLN Take 3 mLs by nebulization every 6 (six) hours as needed (wheezing). 05/25/19   [provider]  Iron, Ferrous Sulfate, 325 (65 Fe) MG  TABS Take 325 mg by mouth daily. 12/01/20   [name], MD  mesalamine (LIALDA) 1.2 g EC tablet Take 1.2 g by mouth 2 (two) times daily.    [provider]  metolazone (ZAROXOLYN) 5 MG tablet TAKE 1 TABLET BY MOUTH AS NEEDED. 08/19/20   [name], MD  MICROLET LANCETS MISC 1 each by Other route. As directed 07/30/15   [provider]  nystatin (MYCOSTATIN/NYSTOP) powder Apply 1 application topically 3 (three) times daily. 09/26/20   [name], MD  ondansetron (ZOFRAN) 4 MG tablet Take 1 tablet (4 mg total) by mouth every 6 (six) hours. 05/23/20   Faustino Congress, NP  oxyCODONE-acetaminophen (PERCOCET/ROXICET) 5-325 MG tablet Take 1 tablet by mouth every 6 (six) hours as needed for severe pain. 12/12/20   Blanchie Dessert, MD  pantoprazole (PROTONIX) 40 MG tablet TAKE 1 TABLET BY MOUTH AT BEDTIME 08/21/20   [name], MD  polyethylene glycol [facility] / Floria Raveling) packet Take 17 g by mouth daily as needed for mild constipation.    [provider]  potassium chloride (MICRO-K) 10 MEQ CR capsule TAKE 5 CAPSULES (50 MEQ TOTAL) BY MOUTH 2 (TWO) TIMES DAILY 12/16/20   Richardson Dopp T, PA-C  predniSONE (DELTASONE) 20 MG tablet Take 2 tablets by mouth once daily for five days, then 1 tablet by mouth for five days. 02/07/21   Pleas Koch, NP  propranolol (INDERAL) 10 MG tablet TAKE 1 TABLET BY MOUTH 2 TIMES DAILY 01/01/21   [name], MD  revefenacin [facility]) 175 MCG/3ML nebulizer solution Take  3 mLs (175 mcg total) by nebulization daily. 05/20/20   [name], MD  rOPINIRole (REQUIP) 4 MG tablet TAKE 1 TABLET BY MOUTH TWICE DAILY 01/01/21   [name], MD  spironolactone (ALDACTONE) 25 MG tablet TAKE 1 TABLET BY MOUTH DAILY. 11/21/20   [name], MD  traMADol (ULTRAM) 50 MG tablet Take 1 tablet (50 mg total) by mouth 3 (three) times daily as needed. 06/18/20   [name], MD  UNABLE TO FIND CPAP- At bedtime    [provider]    Allergies    Almond oil, Morphine and related, Atorvastatin, Ceclor [cefaclor], Sulfa antibiotics, Ciprofloxacin, Levaquin [levofloxacin], Losartan, and Statins  Review of Systems   Review of Systems  Constitutional:  Negative for activity change, chills and fever.  HENT:  Positive for congestion.   Respiratory:  Positive for cough and shortness of breath.   Cardiovascular:  Negative for chest pain.  Gastrointestinal:  Negative for abdominal pain and nausea.  Musculoskeletal:  Negative for myalgias.  Neurological:  Negative for syncope and light-headedness.  All other systems reviewed and are negative.  Physical Exam Updated Vital Signs BP (!) 147/78   Pulse 60   Resp 18   SpO2 92%   Physical Exam Vitals and nursing note reviewed.  Constitutional:      General: She is not in acute distress.    Appearance: Normal appearance. She is not ill-appearing.  HENT:     Head: Normocephalic and atraumatic.     Nose: Nose normal.  Eyes:     General: No scleral icterus.    Extraocular Movements: Extraocular movements intact.     Conjunctiva/sclera: Conjunctivae normal.  Cardiovascular:     Rate and Rhythm: Normal rate and regular rhythm.     Pulses: Normal pulses.     Heart sounds: Normal heart sounds.  Pulmonary:     Effort: Pulmonary effort is normal. No respiratory distress.     Breath sounds: Examination of the right-upper field reveals wheezing. Examination of the left-upper field reveals wheezing. Examination of the right-lower field reveals wheezing. Examination of the left-lower field reveals wheezing. Wheezing present. No rales.  Abdominal:     General: There is no distension.     Tenderness: There is no abdominal tenderness.  Musculoskeletal:        General: Normal range of motion.     Cervical back: Normal range of motion.     Right lower leg: No edema.     Left lower leg: No edema.  Skin:    General: Skin is warm and dry.  Neurological:      General: No focal deficit present.     Mental Status: She is alert. Mental status is at baseline.    ED Results / Procedures / Treatments   Labs (all labs ordered are listed, but only abnormal results are displayed) Labs Reviewed - No data to display  EKG None  Radiology DG Chest 2 View  Result Date: 02/07/2021 CLINICAL DATA:  exertional dyspnea EXAM: CHEST - 2 VIEW COMPARISON:  Radiograph 02/04/2021 FINDINGS: Unchanged cardiomediastinal silhouette. There is no new airspace disease. There is no large pleural effusion. No visible pneumothorax. Biapical pleuroparenchymal scarring. No acute osseous abnormality. Thoracic spondylosis. IMPRESSION: No evidence of acute cardiopulmonary disease. Electronically Signed   By: Maurine Simmering M.D.   On: 02/07/2021 15:50    Procedures Procedures   Medications Ordered in ED Medications  ipratropium-albuterol (DUONEB) 0.5-2.5 (3) MG/3ML nebulizer solution 3 mL (3 mLs  Nebulization Given 02/08/21 1421)    ED Course  I have reviewed the triage vital signs and the nursing notes.  Pertinent labs & imaging results that were available during my care of the patient were reviewed by me and considered in my medical decision making (see chart for details).    MDM Rules/Calculators/A&P                           65 year old female with a past medical history of COPD presents today for evaluation of shortness of breath.  Patient was evaluated in PCP clinic yesterday and was told to come to the emergency room if she had improvement in her symptoms.  She denies any worsening in her symptoms.  She is satting 94% on room air.  Given that she has had 2 chest x-rays within the past week most recently yesterday without pneumonia will defer chest x-ray at this time given that she has had no worsening in her symptoms.  She also had a CBC done yesterday at her PCP office which was without leukocytosis.  Will provide patient with a DuoNeb treatment and ambulate patient and  monitor her pulse ox.  If patient does not desat patient is appropriate for discharge.  Given her history of COPD would add additional Augmentin to her regimen.  Patient received DuoNeb and ambulated within the room for 2 to 3 minutes without desaturation.  Patient is appropriate for discharge. symptomatic treatment discussed.  Return precautions discussed.  Augmentin and Flonase prescribed.  Final Clinical Impression(s) / ED Diagnoses Final diagnoses:  None    Rx / DC Orders ED Discharge Orders     None        Evlyn Courier, PA-C 02/08/21 1458    Fredia Sorrow, MD 02/08/21 [phone]

## 2021-02-10 ENCOUNTER — Telehealth: Payer: Self-pay | Admitting: Family Medicine

## 2021-02-10 ENCOUNTER — Telehealth: Payer: Self-pay | Admitting: Adult Health

## 2021-02-10 MED ORDER — IPRATROPIUM-ALBUTEROL 0.5-2.5 (3) MG/3ML IN SOLN: 3.0000 mL | Freq: Four times a day (QID) | RESPIRATORY_TRACT | 6 refills | Status: DC | PRN

## 2021-02-10 NOTE — Telephone Encounter (Signed)
Refill has been sent to the pharmacy per pts request.  Nothing further is needed.

## 2021-02-10 NOTE — Telephone Encounter (Signed)
Kathleen Hatfield called in and wanted to speak to Dr. [name] about her visit on Thursday with the [facility] and then she saw someone Friday and then she went to hospital Saturday and they made her walk out the ER on Saturday and she feels unloved. And she has never when through that type of experience. And wanted to know about getting in to see him this week

## 2021-02-11 LAB — EXTRA SPECIMEN

## 2021-02-11 LAB — BRAIN NATRIURETIC PEPTIDE: Brain Natriuretic Peptide: 45 pg/mL (ref <100)

## 2021-02-11 NOTE — Telephone Encounter (Signed)
I would still continue with current meds.  I would expect the combination of abx/steroids/SABA to help.  Thanks.

## 2021-02-11 NOTE — Telephone Encounter (Signed)
Spoke with patient and she states she still has a bad cough and can feel mucus moving around but can not get anything up. Patient is still having a hard time breathing from time to time with this. She has four days left on the augmentin, doxycycline and prednisone. She has been using her nebulizer every 4 hours. She is scheduled to come in for f/u on 02/13/21 at 3pm.

## 2021-02-11 NOTE — Telephone Encounter (Signed)
Patient advised and will keep appt on 02/13/21 at 3:00 pm.

## 2021-02-13 ENCOUNTER — Inpatient Hospital Stay: Payer: Medicare HMO | Admitting: Family Medicine

## 2021-02-14 ENCOUNTER — Emergency Department (HOSPITAL_COMMUNITY): Payer: Medicare HMO

## 2021-02-14 ENCOUNTER — Encounter (HOSPITAL_COMMUNITY): Payer: Self-pay | Admitting: Emergency Medicine

## 2021-02-14 ENCOUNTER — Other Ambulatory Visit: Payer: Self-pay

## 2021-02-14 ENCOUNTER — Inpatient Hospital Stay (HOSPITAL_COMMUNITY)
Admission: EM | Admit: 2021-02-14 | Discharge: 2021-02-18 | DRG: 189 | Disposition: A | Discharge status: Home Health | Payer: Medicare HMO | Attending: Internal Medicine | Admitting: Internal Medicine

## 2021-02-14 DIAGNOSIS — Z885 Allergy status to narcotic agent status: Secondary | ICD-10-CM

## 2021-02-14 DIAGNOSIS — N390 Urinary tract infection, site not specified: Secondary | ICD-10-CM | POA: Diagnosis present

## 2021-02-14 DIAGNOSIS — K219 Gastro-esophageal reflux disease without esophagitis: Secondary | ICD-10-CM | POA: Diagnosis present

## 2021-02-14 DIAGNOSIS — I10 Essential (primary) hypertension: Secondary | ICD-10-CM | POA: Diagnosis not present

## 2021-02-14 DIAGNOSIS — F32A Depression, unspecified: Secondary | ICD-10-CM | POA: Diagnosis present

## 2021-02-14 DIAGNOSIS — J441 Chronic obstructive pulmonary disease with (acute) exacerbation: Secondary | ICD-10-CM | POA: Diagnosis not present

## 2021-02-14 DIAGNOSIS — Z7722 Contact with and (suspected) exposure to environmental tobacco smoke (acute) (chronic): Secondary | ICD-10-CM | POA: Diagnosis present

## 2021-02-14 DIAGNOSIS — E875 Hyperkalemia: Secondary | ICD-10-CM | POA: Diagnosis not present

## 2021-02-14 DIAGNOSIS — R739 Hyperglycemia, unspecified: Secondary | ICD-10-CM | POA: Diagnosis not present

## 2021-02-14 DIAGNOSIS — Z882 Allergy status to sulfonamides status: Secondary | ICD-10-CM

## 2021-02-14 DIAGNOSIS — E785 Hyperlipidemia, unspecified: Secondary | ICD-10-CM | POA: Diagnosis present

## 2021-02-14 DIAGNOSIS — Z8249 Family history of ischemic heart disease and other diseases of the circulatory system: Secondary | ICD-10-CM

## 2021-02-14 DIAGNOSIS — E871 Hypo-osmolality and hyponatremia: Secondary | ICD-10-CM | POA: Diagnosis present

## 2021-02-14 DIAGNOSIS — I130 Hypertensive heart and chronic kidney disease with heart failure and stage 1 through stage 4 chronic kidney disease, or unspecified chronic kidney disease: Secondary | ICD-10-CM | POA: Diagnosis present

## 2021-02-14 DIAGNOSIS — J45901 Unspecified asthma with (acute) exacerbation: Secondary | ICD-10-CM | POA: Diagnosis present

## 2021-02-14 DIAGNOSIS — R0902 Hypoxemia: Secondary | ICD-10-CM | POA: Diagnosis not present

## 2021-02-14 DIAGNOSIS — I129 Hypertensive chronic kidney disease with stage 1 through stage 4 chronic kidney disease, or unspecified chronic kidney disease: Secondary | ICD-10-CM | POA: Diagnosis not present

## 2021-02-14 DIAGNOSIS — Z7951 Long term (current) use of inhaled steroids: Secondary | ICD-10-CM

## 2021-02-14 DIAGNOSIS — G2581 Restless legs syndrome: Secondary | ICD-10-CM | POA: Diagnosis present

## 2021-02-14 DIAGNOSIS — N179 Acute kidney failure, unspecified: Secondary | ICD-10-CM | POA: Diagnosis present

## 2021-02-14 DIAGNOSIS — I5032 Chronic diastolic (congestive) heart failure: Secondary | ICD-10-CM | POA: Diagnosis present

## 2021-02-14 DIAGNOSIS — R059 Cough, unspecified: Secondary | ICD-10-CM | POA: Diagnosis not present

## 2021-02-14 DIAGNOSIS — K5190 Ulcerative colitis, unspecified, without complications: Secondary | ICD-10-CM | POA: Diagnosis present

## 2021-02-14 DIAGNOSIS — Z881 Allergy status to other antibiotic agents status: Secondary | ICD-10-CM

## 2021-02-14 DIAGNOSIS — E1142 Type 2 diabetes mellitus with diabetic polyneuropathy: Secondary | ICD-10-CM | POA: Diagnosis present

## 2021-02-14 DIAGNOSIS — Z888 Allergy status to other drugs, medicaments and biological substances status: Secondary | ICD-10-CM

## 2021-02-14 DIAGNOSIS — R062 Wheezing: Secondary | ICD-10-CM | POA: Diagnosis not present

## 2021-02-14 DIAGNOSIS — Z7985 Long-term (current) use of injectable non-insulin antidiabetic drugs: Secondary | ICD-10-CM

## 2021-02-14 DIAGNOSIS — J90 Pleural effusion, not elsewhere classified: Secondary | ICD-10-CM | POA: Diagnosis not present

## 2021-02-14 DIAGNOSIS — J9621 Acute and chronic respiratory failure with hypoxia: Principal | ICD-10-CM | POA: Diagnosis present

## 2021-02-14 DIAGNOSIS — G8929 Other chronic pain: Secondary | ICD-10-CM | POA: Diagnosis present

## 2021-02-14 DIAGNOSIS — T380X5A Adverse effect of glucocorticoids and synthetic analogues, initial encounter: Secondary | ICD-10-CM | POA: Diagnosis present

## 2021-02-14 DIAGNOSIS — N189 Chronic kidney disease, unspecified: Secondary | ICD-10-CM

## 2021-02-14 DIAGNOSIS — N1831 Chronic kidney disease, stage 3a: Secondary | ICD-10-CM | POA: Diagnosis present

## 2021-02-14 DIAGNOSIS — Z79899 Other long term (current) drug therapy: Secondary | ICD-10-CM

## 2021-02-14 DIAGNOSIS — Z794 Long term (current) use of insulin: Secondary | ICD-10-CM

## 2021-02-14 DIAGNOSIS — E1122 Type 2 diabetes mellitus with diabetic chronic kidney disease: Secondary | ICD-10-CM | POA: Diagnosis present

## 2021-02-14 DIAGNOSIS — Z20822 Contact with and (suspected) exposure to covid-19: Secondary | ICD-10-CM | POA: Diagnosis present

## 2021-02-14 DIAGNOSIS — E860 Dehydration: Secondary | ICD-10-CM | POA: Diagnosis present

## 2021-02-14 DIAGNOSIS — R0602 Shortness of breath: Secondary | ICD-10-CM | POA: Diagnosis not present

## 2021-02-14 DIAGNOSIS — Z743 Need for continuous supervision: Secondary | ICD-10-CM | POA: Diagnosis not present

## 2021-02-14 DIAGNOSIS — I5030 Unspecified diastolic (congestive) heart failure: Secondary | ICD-10-CM | POA: Diagnosis present

## 2021-02-14 DIAGNOSIS — Z825 Family history of asthma and other chronic lower respiratory diseases: Secondary | ICD-10-CM

## 2021-02-14 DIAGNOSIS — J101 Influenza due to other identified influenza virus with other respiratory manifestations: Secondary | ICD-10-CM | POA: Diagnosis present

## 2021-02-14 DIAGNOSIS — E1165 Type 2 diabetes mellitus with hyperglycemia: Secondary | ICD-10-CM | POA: Diagnosis present

## 2021-02-14 LAB — I-STAT VENOUS BLOOD GAS, ED
Acid-Base Excess: 7 mmol/L — ABNORMAL HIGH (ref 0.0–2.0)
Bicarbonate: 31.8 mmol/L — ABNORMAL HIGH (ref 20.0–28.0)
Calcium, Ion: 1.07 mmol/L — ABNORMAL LOW (ref 1.15–1.40)
HCT: 49 % — ABNORMAL HIGH (ref 36.0–46.0)
Hemoglobin: 16.7 g/dL — ABNORMAL HIGH (ref 12.0–15.0)
O2 Saturation: 98 %
Potassium: 6.6 mmol/L (ref 3.5–5.1)
Sodium: 125 mmol/L — ABNORMAL LOW (ref 135–145)
TCO2: 33 mmol/L — ABNORMAL HIGH (ref 22–32)
pCO2, Ven: 44.4 mmHg (ref 44.0–60.0)
pH, Ven: 7.463 — ABNORMAL HIGH (ref 7.250–7.430)
pO2, Ven: 98 mmHg — ABNORMAL HIGH (ref 32.0–45.0)

## 2021-02-14 LAB — BASIC METABOLIC PANEL
Anion gap: 13 (ref 5–15)
Anion gap: 16 — ABNORMAL HIGH (ref 5–15)
Anion gap: 12 (ref 5–15)
BUN: 35 mg/dL — ABNORMAL HIGH (ref 8–23)
BUN: 34 mg/dL — ABNORMAL HIGH (ref 8–23)
BUN: 30 mg/dL — ABNORMAL HIGH (ref 8–23)
CO2: 26 mmol/L (ref 22–32)
CO2: 24 mmol/L (ref 22–32)
CO2: 27 mmol/L (ref 22–32)
Calcium: 9.3 mg/dL (ref 8.9–10.3)
Calcium: 9.4 mg/dL (ref 8.9–10.3)
Calcium: 9.7 mg/dL (ref 8.9–10.3)
Chloride: 83 mmol/L — ABNORMAL LOW (ref 98–111)
Chloride: 91 mmol/L — ABNORMAL LOW (ref 98–111)
Chloride: 93 mmol/L — ABNORMAL LOW (ref 98–111)
Creatinine, Ser: 1.67 mg/dL — ABNORMAL HIGH (ref 0.44–1.00)
Creatinine, Ser: 1.43 mg/dL — ABNORMAL HIGH (ref 0.44–1.00)
Creatinine, Ser: 1.27 mg/dL — ABNORMAL HIGH (ref 0.44–1.00)
GFR, Estimated: 34 mL/min — ABNORMAL LOW (ref >60)
GFR, Estimated: 40 mL/min — ABNORMAL LOW (ref >60)
GFR, Estimated: 47 mL/min — ABNORMAL LOW (ref >60)
Glucose, Bld: 703 mg/dL (ref 70–99)
Glucose, Bld: 334 mg/dL — ABNORMAL HIGH (ref 70–99)
Glucose, Bld: 231 mg/dL — ABNORMAL HIGH (ref 70–99)
Potassium: 5.6 mmol/L — ABNORMAL HIGH (ref 3.5–5.1)
Potassium: 4.6 mmol/L (ref 3.5–5.1)
Potassium: 4.4 mmol/L (ref 3.5–5.1)
Sodium: 122 mmol/L — ABNORMAL LOW (ref 135–145)
Sodium: 131 mmol/L — ABNORMAL LOW (ref 135–145)
Sodium: 132 mmol/L — ABNORMAL LOW (ref 135–145)

## 2021-02-14 LAB — GLUCOSE, CAPILLARY
Glucose-Capillary: 383 mg/dL — ABNORMAL HIGH (ref 70–99)
Glucose-Capillary: 414 mg/dL — ABNORMAL HIGH (ref 70–99)

## 2021-02-14 LAB — PROCALCITONIN: Procalcitonin: <0.1 ng/mL

## 2021-02-14 LAB — CBC
HCT: 44.4 % (ref 36.0–46.0)
Hemoglobin: 14.7 g/dL (ref 12.0–15.0)
MCH: 29.8 pg (ref 26.0–34.0)
MCHC: 33.1 g/dL (ref 30.0–36.0)
MCV: 89.9 fL (ref 80.0–100.0)
Platelets: 313 10*3/uL (ref 150–400)
RBC: 4.94 MIL/uL (ref 3.87–5.11)
RDW: 14.1 % (ref 11.5–15.5)
WBC: 11.9 10*3/uL — ABNORMAL HIGH (ref 4.0–10.5)
nRBC: 0 % (ref 0.0–0.2)

## 2021-02-14 LAB — CBC WITH DIFFERENTIAL/PLATELET
Abs Immature Granulocytes: 0.22 10*3/uL — ABNORMAL HIGH (ref 0.00–0.07)
Basophils Absolute: 0 10*3/uL (ref 0.0–0.1)
Basophils Relative: 0 %
Eosinophils Absolute: 0 10*3/uL (ref 0.0–0.5)
Eosinophils Relative: 0 %
HCT: 46.9 % — ABNORMAL HIGH (ref 36.0–46.0)
Hemoglobin: 15 g/dL (ref 12.0–15.0)
Immature Granulocytes: 2 %
Lymphocytes Relative: 21 %
Lymphs Abs: 2.5 10*3/uL (ref 0.7–4.0)
MCH: 29.3 pg (ref 26.0–34.0)
MCHC: 32 g/dL (ref 30.0–36.0)
MCV: 91.6 fL (ref 80.0–100.0)
Monocytes Absolute: 0.6 10*3/uL (ref 0.1–1.0)
Monocytes Relative: 5 %
Neutro Abs: 8.4 10*3/uL — ABNORMAL HIGH (ref 1.7–7.7)
Neutrophils Relative %: 72 %
Platelets: 311 10*3/uL (ref 150–400)
RBC: 5.12 MIL/uL — ABNORMAL HIGH (ref 3.87–5.11)
RDW: 14.1 % (ref 11.5–15.5)
WBC: 11.8 10*3/uL — ABNORMAL HIGH (ref 4.0–10.5)
nRBC: 0 % (ref 0.0–0.2)

## 2021-02-14 LAB — RESP PANEL BY RT-PCR (FLU A&B, COVID) ARPGX2
Influenza A by PCR: POSITIVE — AB
Influenza B by PCR: NEGATIVE
SARS Coronavirus 2 by RT PCR: NEGATIVE

## 2021-02-14 LAB — CBG MONITORING, ED
Glucose-Capillary: 542 mg/dL (ref 70–99)
Glucose-Capillary: >600 mg/dL (ref 70–99)
Glucose-Capillary: 429 mg/dL — ABNORMAL HIGH (ref 70–99)
Glucose-Capillary: 340 mg/dL — ABNORMAL HIGH (ref 70–99)
Glucose-Capillary: 320 mg/dL — ABNORMAL HIGH (ref 70–99)
Glucose-Capillary: 235 mg/dL — ABNORMAL HIGH (ref 70–99)
Glucose-Capillary: 218 mg/dL — ABNORMAL HIGH (ref 70–99)
Glucose-Capillary: 202 mg/dL — ABNORMAL HIGH (ref 70–99)
Glucose-Capillary: 377 mg/dL — ABNORMAL HIGH (ref 70–99)

## 2021-02-14 LAB — HIV ANTIBODY (ROUTINE TESTING W REFLEX): HIV Screen 4th Generation wRfx: NONREACTIVE

## 2021-02-14 LAB — HEMOGLOBIN A1C
Hgb A1c MFr Bld: 10.9 % — ABNORMAL HIGH (ref 4.8–5.6)
Mean Plasma Glucose: 266.13 mg/dL

## 2021-02-14 LAB — GLUCOSE, RANDOM: Glucose, Bld: 408 mg/dL — ABNORMAL HIGH (ref 70–99)

## 2021-02-14 LAB — OSMOLALITY: Osmolality: 309 mOsm/kg — ABNORMAL HIGH (ref 275–295)

## 2021-02-14 LAB — BRAIN NATRIURETIC PEPTIDE: B Natriuretic Peptide: 40.3 pg/mL (ref 0.0–100.0)

## 2021-02-14 MED ORDER — LACTATED RINGERS IV SOLN: INTRAVENOUS | Status: DC

## 2021-02-14 MED ORDER — DEXTROSE 50 % IV SOLN: 0.0000 mL | INTRAVENOUS | Status: DC | PRN

## 2021-02-14 MED ORDER — DM-GUAIFENESIN ER 30-600 MG PO TB12
1.0000 | ORAL_TABLET | Freq: Two times a day (BID) | ORAL | Status: DC
  Administered 2021-02-14 – 2021-02-18 (×9): 1 via ORAL
  Filled 2021-02-14 (×10): qty 1

## 2021-02-14 MED ORDER — ARFORMOTEROL TARTRATE 15 MCG/2ML IN NEBU
15.0000 ug | INHALATION_SOLUTION | Freq: Two times a day (BID) | RESPIRATORY_TRACT | Status: DC
  Administered 2021-02-14 – 2021-02-17 (×8): 15 ug via RESPIRATORY_TRACT
  Filled 2021-02-14 (×9): qty 2

## 2021-02-14 MED ORDER — PREDNISONE 10 MG PO TABS: 10.0000 mg | ORAL_TABLET | Freq: Every day | ORAL | Status: DC

## 2021-02-14 MED ORDER — INSULIN GLARGINE-YFGN 100 UNIT/ML ~~LOC~~ SOLN
30.0000 [IU] | Freq: Two times a day (BID) | SUBCUTANEOUS | Status: DC
  Administered 2021-02-14 (×2): 30 [IU] via SUBCUTANEOUS
  Filled 2021-02-14 (×4): qty 0.3

## 2021-02-14 MED ORDER — BUDESONIDE 0.25 MG/2ML IN SUSP
0.2500 mg | Freq: Two times a day (BID) | RESPIRATORY_TRACT | Status: DC
Start: 2021-02-14 — End: 2021-02-18
  Administered 2021-02-14 – 2021-02-17 (×8): 0.25 mg via RESPIRATORY_TRACT
  Filled 2021-02-14 (×9): qty 2

## 2021-02-14 MED ORDER — OSELTAMIVIR PHOSPHATE 30 MG PO CAPS
30.0000 mg | ORAL_CAPSULE | Freq: Two times a day (BID) | ORAL | Status: DC
  Administered 2021-02-14 – 2021-02-18 (×9): 30 mg via ORAL
  Filled 2021-02-14 (×12): qty 1

## 2021-02-14 MED ORDER — ACETAMINOPHEN 650 MG RE SUPP: 650.0000 mg | Freq: Four times a day (QID) | RECTAL | Status: DC | PRN

## 2021-02-14 MED ORDER — PROPRANOLOL HCL 10 MG PO TABS
10.0000 mg | ORAL_TABLET | Freq: Two times a day (BID) | ORAL | Status: DC
  Administered 2021-02-14 – 2021-02-18 (×9): 10 mg via ORAL
  Filled 2021-02-14 (×11): qty 1

## 2021-02-14 MED ORDER — INSULIN ASPART 100 UNIT/ML IJ SOLN
7.0000 [IU] | Freq: Once | INTRAMUSCULAR | Status: AC
  Administered 2021-02-14: 7 [IU] via SUBCUTANEOUS

## 2021-02-14 MED ORDER — BUPROPION HCL ER (XL) 150 MG PO TB24
300.0000 mg | ORAL_TABLET | Freq: Every day | ORAL | Status: DC
  Administered 2021-02-14 – 2021-02-18 (×5): 300 mg via ORAL
  Filled 2021-02-14: qty 1
  Filled 2021-02-14 (×4): qty 2

## 2021-02-14 MED ORDER — ENOXAPARIN SODIUM 40 MG/0.4ML IJ SOSY
40.0000 mg | PREFILLED_SYRINGE | INTRAMUSCULAR | Status: DC
  Administered 2021-02-14 – 2021-02-17 (×4): 40 mg via SUBCUTANEOUS
  Filled 2021-02-14 (×4): qty 0.4

## 2021-02-14 MED ORDER — PREDNISONE 20 MG PO TABS
40.0000 mg | ORAL_TABLET | Freq: Every day | ORAL | Status: AC
  Administered 2021-02-14: 40 mg via ORAL
  Filled 2021-02-14: qty 2

## 2021-02-14 MED ORDER — PREDNISONE 20 MG PO TABS
20.0000 mg | ORAL_TABLET | Freq: Every day | ORAL | Status: AC
  Administered 2021-02-17 – 2021-02-18 (×2): 20 mg via ORAL
  Filled 2021-02-14 (×2): qty 1

## 2021-02-14 MED ORDER — INSULIN REGULAR(HUMAN) IN NACL 100-0.9 UT/100ML-% IV SOLN
INTRAVENOUS | Status: DC
  Administered 2021-02-14: 17 [IU]/h via INTRAVENOUS
  Filled 2021-02-14: qty 100

## 2021-02-14 MED ORDER — INSULIN ASPART 100 UNIT/ML IJ SOLN
0.0000 [IU] | Freq: Three times a day (TID) | INTRAMUSCULAR | Status: DC
  Administered 2021-02-14 – 2021-02-15 (×2): 20 [IU] via SUBCUTANEOUS
  Administered 2021-02-15: 15 [IU] via SUBCUTANEOUS
  Administered 2021-02-15: 4 [IU] via SUBCUTANEOUS
  Administered 2021-02-16: 20 [IU] via SUBCUTANEOUS
  Administered 2021-02-16: 4 [IU] via SUBCUTANEOUS
  Administered 2021-02-16: 15 [IU] via SUBCUTANEOUS
  Administered 2021-02-17: 2 [IU] via SUBCUTANEOUS
  Administered 2021-02-17: 15 [IU] via SUBCUTANEOUS
  Administered 2021-02-17 – 2021-02-18 (×2): 4 [IU] via SUBCUTANEOUS

## 2021-02-14 MED ORDER — ALBUTEROL SULFATE (2.5 MG/3ML) 0.083% IN NEBU
10.0000 mg/h | INHALATION_SOLUTION | Freq: Once | RESPIRATORY_TRACT | Status: AC
  Administered 2021-02-14: 10 mg/h via RESPIRATORY_TRACT
  Filled 2021-02-14 (×2): qty 12

## 2021-02-14 MED ORDER — ALBUTEROL SULFATE (2.5 MG/3ML) 0.083% IN NEBU: 2.5000 mg | INHALATION_SOLUTION | RESPIRATORY_TRACT | Status: DC | PRN

## 2021-02-14 MED ORDER — IPRATROPIUM-ALBUTEROL 0.5-2.5 (3) MG/3ML IN SOLN
3.0000 mL | Freq: Four times a day (QID) | RESPIRATORY_TRACT | Status: DC
  Administered 2021-02-14 – 2021-02-18 (×13): 3 mL via RESPIRATORY_TRACT
  Filled 2021-02-14 (×16): qty 3

## 2021-02-14 MED ORDER — PREDNISONE 20 MG PO TABS
30.0000 mg | ORAL_TABLET | Freq: Every day | ORAL | Status: AC
  Administered 2021-02-15 – 2021-02-16 (×2): 30 mg via ORAL
  Filled 2021-02-14 (×2): qty 1

## 2021-02-14 MED ORDER — INSULIN ASPART 100 UNIT/ML IJ SOLN
0.0000 [IU] | Freq: Every day | INTRAMUSCULAR | Status: DC
  Administered 2021-02-15: 4 [IU] via SUBCUTANEOUS
  Administered 2021-02-16: 2 [IU] via SUBCUTANEOUS
  Administered 2021-02-17: 3 [IU] via SUBCUTANEOUS

## 2021-02-14 MED ORDER — DULOXETINE HCL 60 MG PO CPEP
60.0000 mg | ORAL_CAPSULE | Freq: Every day | ORAL | Status: DC
  Administered 2021-02-14 – 2021-02-17 (×4): 60 mg via ORAL
  Filled 2021-02-14 (×4): qty 1

## 2021-02-14 MED ORDER — PANTOPRAZOLE SODIUM 40 MG PO TBEC
40.0000 mg | DELAYED_RELEASE_TABLET | Freq: Every day | ORAL | Status: DC
  Administered 2021-02-14 – 2021-02-18 (×5): 40 mg via ORAL
  Filled 2021-02-14 (×5): qty 1

## 2021-02-14 MED ORDER — IPRATROPIUM-ALBUTEROL 0.5-2.5 (3) MG/3ML IN SOLN
3.0000 mL | Freq: Once | RESPIRATORY_TRACT | Status: AC
  Administered 2021-02-14: 3 mL via RESPIRATORY_TRACT
  Filled 2021-02-14: qty 3

## 2021-02-14 MED ORDER — GABAPENTIN 300 MG PO CAPS
300.0000 mg | ORAL_CAPSULE | Freq: Every day | ORAL | Status: DC
  Administered 2021-02-14 – 2021-02-17 (×4): 300 mg via ORAL
  Filled 2021-02-14 (×4): qty 1

## 2021-02-14 MED ORDER — LACTATED RINGERS IV BOLUS
1000.0000 mL | Freq: Once | INTRAVENOUS | Status: AC
  Administered 2021-02-14: 1000 mL via INTRAVENOUS

## 2021-02-14 MED ORDER — ROPINIROLE HCL 1 MG PO TABS
4.0000 mg | ORAL_TABLET | Freq: Two times a day (BID) | ORAL | Status: DC
  Administered 2021-02-14 – 2021-02-18 (×9): 4 mg via ORAL
  Filled 2021-02-14 (×10): qty 4

## 2021-02-14 MED ORDER — ACETAMINOPHEN 325 MG PO TABS
650.0000 mg | ORAL_TABLET | Freq: Four times a day (QID) | ORAL | Status: DC | PRN
  Administered 2021-02-16 – 2021-02-18 (×4): 650 mg via ORAL
  Filled 2021-02-14 (×4): qty 2

## 2021-02-14 MED ORDER — DEXTROSE IN LACTATED RINGERS 5 % IV SOLN: INTRAVENOUS | Status: DC

## 2021-02-14 MED ORDER — INSULIN ASPART 100 UNIT/ML IV SOLN: 10.0000 [IU] | Freq: Once | INTRAVENOUS | Status: DC

## 2021-02-14 NOTE — H&P (Signed)
History and Physical    DEAUN ROCHA SPQ:[id number] DOB: 01-09-1956 DOA: 02/14/2021  PCP: [name], MD Patient coming from: Home  Chief Complaint: Shortness of breath  HPI: Kathleen Hatfield is a 65 y.o. female with medical history significant of COPD with asthma, chronic diastolic CHF, CKD stage III, depression, insulin-dependent type 2 diabetes, GERD, hyperlipidemia, hypertension, RLS, OSA on CPAP, ulcerative colitis.  Recently seen by PCP on 12/2 for COPD exacerbation and prescribed doxycycline and 10-day prednisone taper.  Subsequently seen in the ED and prescribed Augmentin and Flonase. She returns to the ED today via EMS for evaluation of shortness of breath and cough.  Reportedly oxygen saturation in the 70s at home prior to EMS arrival.  She was given albuterol neb, DuoNeb, and IV Solu-Medrol 125 mg in route.  Oxygen saturation in the 80s on room air in the ED, placed on 2 L supplemental oxygen.  Not febrile or tachycardic.  Labs showing WBC 11.8.  Sodium 122.  Potassium 5.6.  Chloride 83.  Blood glucose 703.  Bicarb and anion gap normal.  BUN 35.  Creatinine 1.6, baseline 1.0-1.4.  Influenza A PCR positive.  COVID-negative.  BNP normal.  Blood gas without evidence of metabolic acidosis.  Chest x-ray showing small left pleural effusion; no focal infiltrate. Patient was given albuterol neb, DuoNeb, 1 L LR bolus, and started on insulin infusion.  Patient reports 3-week history of progressively worsening cough.  Her chest feels congested but she is not able to bring up any sputum.  States she was seen at urgent care 2 weeks ago when her boyfriend tested positive for influenza A but she tested negative.  This past week she is having increasing shortness of breath.  She was prescribed prednisone, Augmentin, and doxycycline a week ago and has been taking them with no improvement.  She is also using her home COPD inhalers but they are not helping.  She is having chills and sweats.  Her oxygen saturation  has been in the 80s at home and last night it dropped to 77% which prompted her to call EMS.  She has never smoked cigarettes but reports long-term secondhand smoke exposure as several family members smoked cigarettes.  States she normally uses [place] 90 units daily but increased the dose to 100 units daily 2 days ago as advised by her PCP as she is taking steroids.   Review of Systems:  All systems reviewed and apart from history of presenting illness, are negative.  Past Medical History:  Diagnosis Date   Anemia    Arthritis    Asthma    Chronic diastolic CHF 33/3545   Echocardiogram 05/2019: EF 70, no RWMA, mild LVH, Gr 1 DD, normal RVSF, severe LVH, borderline asc Aorta (39 mm)   CKD (chronic kidney disease)    Depression    Diabetes mellitus without complication (HCC)    Diverticulitis    Dyspnea    with exertion   GERD (gastroesophageal reflux disease)    History of blood transfusion    Hyperlipidemia    cannot tolerate statins   Hypertension    patient states she has never had high blood pressure.    Nuclear stress test    Myoview 05/2019: EF 83, no ischemia or infarction; Low Risk   Peripheral neuropathy    Pneumonia    PONV (postoperative nausea and vomiting)    RLS (restless legs syndrome)    Sleep apnea    uses CPAP   Ulcerative colitis ([place])  dr Collene Mares    Past Surgical History:  Procedure Laterality Date   ABDOMINAL HYSTERECTOMY     APPENDECTOMY     BREAST BIOPSY     BREAST SURGERY     left biopsy   CARDIAC CATHETERIZATION     CESAREAN SECTION     CHOLECYSTECTOMY     HERNIA REPAIR     INSERTION OF MESH N/A 11/15/2015   Procedure: INSERTION OF MESH;  Surgeon: [name], MD;  Location: [facility]S;  Service: General;  Laterality: N/A;   LAPAROSCOPIC LYSIS OF ADHESIONS N/A 11/15/2015   Procedure: LAPAROSCOPIC LYSIS OF ADHESIONS;  Surgeon: [name], MD;  Location: [facility];  Service: General;  Laterality: N/A;   RIGHT HEART CATHETERIZATION N/A 02/27/2013    Procedure: RIGHT HEART CATH;  Surgeon: Blane Ohara, MD;  Location: [facility] CATH LAB;  Service: Cardiovascular;  Laterality: N/A;   RIGHT HEART CATHETERIZATION N/A 12/14/2013   Procedure: RIGHT HEART CATH;  Surgeon: Larey Dresser, MD;  Location: [facility] CATH LAB;  Service: Cardiovascular;  Laterality: N/A;   SIGMOIDECTOMY  2010   diverticular disease   VENTRAL HERNIA REPAIR N/A 11/15/2015   Procedure: LAPAROSCOPIC VENTRAL WALL HERNIA REPAIR;  Surgeon: [name], MD;  Location: [facility];  Service: General;  Laterality: N/A;     reports that she has never smoked. She has never used smokeless tobacco. She reports that she does not drink alcohol and does not use drugs.  Allergies  Allergen Reactions   Almond Oil Anaphylaxis, Shortness Of Breath and Swelling   Morphine And Related Shortness Of Breath and Other (See Comments)    Pt. States while in the hospital it affected her breathing, O2 dropped to the 70's   Atorvastatin Other (See Comments)    Leg weakness   Ceclor [Cefaclor] Nausea And Vomiting   Sulfa Antibiotics Nausea And Vomiting   Ciprofloxacin Other (See Comments)    Makes joints and muscles ache   Levaquin [Levofloxacin] Other (See Comments)    Body aches   Losartan Other (See Comments)    Weakness    Statins Other (See Comments)    Leg and body weakness    Family History  Problem Relation Age of Onset   Heart disease Mother    Hypertension Mother    Dementia Mother    Heart disease Father    COPD Father    Hypertension Father    Heart disease Brother    Other Brother        knee replacement; hip replacement   Other Brother        cant brathe when laying down   Breast cancer Neg Hx    Colon cancer Neg Hx     Prior to Admission medications   Medication Sig Start Date End Date Taking? Authorizing Provider  acetaminophen (TYLENOL) 500 MG tablet Take 500 mg by mouth every 6 (six) hours as needed for moderate pain.    [provider]  albuterol (VENTOLIN HFA)  108 (90 Base) MCG/ACT inhaler Inhale 2 puffs into the lungs every 6 (six) hours as needed. Patient taking differently: Inhale 2 puffs into the lungs every 6 (six) hours as needed for shortness of breath or wheezing. 02/16/20   [name], MD  amoxicillin-clavulanate (AUGMENTIN) 875-125 MG tablet Take 1 tablet by mouth every 12 (twelve) hours. 02/08/21   Evlyn Courier, PA-C  BAYER CONTOUR NEXT TEST test strip 1 each by Other route daily. As directed 07/30/15   [provider]  budesonide (PULMICORT) 0.5 MG/2ML nebulizer solution Take 2 mLs (0.5 mg total) by nebulization 2 (two) times daily. 05/23/20   [name], MD  buPROPion (WELLBUTRIN XL) 300 MG 24 hr tablet TAKE 1 TABLET BY MOUTH DAILY Patient taking differently: Take 300 mg by mouth daily. 09/25/20   [name], MD  Cholecalciferol (VITAMIN D-3) 5000 UNITS TABS Take 5,000 Units by mouth every other day.     [provider]  colchicine 0.6 MG tablet Take 1 tablet (0.6 mg total) by mouth daily as needed (for gout). 08/15/20   [name], MD  dicyclomine (BENTYL) 20 MG tablet TAKE 1 TABLET UP TO FOUR TIMES A DAY FOR GI CRAMPING, PAIN, AND NAUSEA/VOMITING AS NEEDED 11/26/20   [name], MD  doxycycline (VIBRA-TABS) 100 MG tablet Take 1 tablet (100 mg total) by mouth 2 (two) times daily. 02/07/21   Pleas Koch, NP  Dulaglutide (TRULICITY) 4.5 PF/7.9KW SOPN Inject 4.5 mg as directed once a week. 02/04/21   [name], MD  DULoxetine (CYMBALTA) 60 MG capsule TAKE 1 CAPSULE BY MOUTH ONCE DAILY. Patient taking differently: 60 mg daily. 01/01/21   [name], MD  fluticasone Asencion Islam) 50 MCG/ACT nasal spray Place 2 sprays into both nostrils daily. 02/08/21   Evlyn Courier, PA-C  furosemide (LASIX) 20 MG tablet Take 3 tablets (60 mg total) by mouth 2 (two) times daily. 05/13/20   Richardson Dopp T, PA-C  gabapentin (NEURONTIN) 100 MG capsule TAKE 3 CAPSULES BY MOUTH AT BEDTIME. Patient taking differently: 300  mg at bedtime. 09/25/20   [name], MD  GLOBAL EASE INJECT PEN NEEDLES 32G X 4 MM MISC USE AS DIRECTED WITH VICTOZA AND TRESIBA. 08/12/20   [name], MD  insulin degludec (TRESIBA FLEXTOUCH) 200 UNIT/ML FlexTouch Pen INJECT 90 UNITS UNDER SKIN ONCE DAILY Patient taking differently: 90 Units daily. 11/26/20   [name], MD  ipratropium-albuterol (DUONEB) 0.5-2.5 (3) MG/3ML SOLN Take 3 mLs by nebulization every 6 (six) hours as needed (wheezing). 02/10/21   [name], MD  Iron, Ferrous Sulfate, 325 (65 Fe) MG TABS Take 325 mg by mouth daily. 12/01/20   [name], MD  mesalamine (LIALDA) 1.2 g EC tablet Take 1.2 g by mouth 2 (two) times daily.    [provider]  metolazone (ZAROXOLYN) 5 MG tablet TAKE 1 TABLET BY MOUTH AS NEEDED. 08/19/20   [name], MD  MICROLET LANCETS MISC 1 each by Other route. As directed 07/30/15   [provider]  nystatin (MYCOSTATIN/NYSTOP) powder Apply 1 application topically 3 (three) times daily. 09/26/20   [name], MD  ondansetron (ZOFRAN) 4 MG tablet Take 1 tablet (4 mg total) by mouth every 6 (six) hours. 05/23/20   Faustino Congress, NP  oxyCODONE-acetaminophen (PERCOCET/ROXICET) 5-325 MG tablet Take 1 tablet by mouth every 6 (six) hours as needed for severe pain. 12/12/20   Blanchie Dessert, MD  pantoprazole (PROTONIX) 40 MG tablet TAKE 1 TABLET BY MOUTH AT BEDTIME 08/21/20   [name], MD  polyethylene glycol [facility] / Floria Raveling) packet Take 17 g by mouth daily as needed for mild constipation.    [provider]  potassium chloride (MICRO-K) 10 MEQ CR capsule TAKE 5 CAPSULES (50 MEQ TOTAL) BY MOUTH 2 (TWO) TIMES DAILY 12/16/20   Richardson Dopp T, PA-C  predniSONE (DELTASONE) 20 MG tablet Take 2 tablets by mouth once daily for five days, then 1 tablet by mouth for five  days. 02/07/21   Pleas Koch, NP  propranolol (INDERAL) 10 MG tablet TAKE 1 TABLET BY MOUTH 2 TIMES DAILY Patient taking  differently: Take 10 mg by mouth 2 (two) times daily. 01/01/21   [name], MD  revefenacin [facility]) 175 MCG/3ML nebulizer solution Take 3 mLs (175 mcg total) by nebulization daily. 05/20/20   [name], MD  rOPINIRole (REQUIP) 4 MG tablet TAKE 1 TABLET BY MOUTH TWICE DAILY Patient taking differently: Take 4 mg by mouth in the morning and at bedtime. 01/01/21   [name], MD  spironolactone (ALDACTONE) 25 MG tablet TAKE 1 TABLET BY MOUTH DAILY. 11/21/20   [name], MD  traMADol (ULTRAM) 50 MG tablet Take 1 tablet (50 mg total) by mouth 3 (three) times daily as needed. 06/18/20   [name], MD  UNABLE TO FIND CPAP- At bedtime    [provider]    Physical Exam: Vitals:   02/14/21 0400 02/14/21 0415 02/14/21 0430 02/14/21 0445  BP: 122/67 (!) 141/100 138/81 (!) 111/92  Pulse: 76 76 79 78  Resp: 18 (!) 21 (!) 23 (!) 28  Temp:      TempSrc:      SpO2: 92% (!) 85% 94% 91%    Physical Exam Constitutional:      General: She is not in acute distress. HENT:     Head: Normocephalic and atraumatic.  Eyes:     Extraocular Movements: Extraocular movements intact.     Conjunctiva/sclera: Conjunctivae normal.  Cardiovascular:     Rate and Rhythm: Normal rate and regular rhythm.     Pulses: Normal pulses.  Pulmonary:     Effort: Pulmonary effort is normal. No respiratory distress.     Breath sounds: Wheezing present. No rales.     Comments: Coughing aggressively Abdominal:     General: Bowel sounds are normal. There is no distension.     Palpations: Abdomen is soft.     Tenderness: There is no abdominal tenderness.  Musculoskeletal:        General: No swelling or tenderness.     Cervical back: Normal range of motion and neck supple.  Skin:    General: Skin is warm and dry.  Neurological:     General: No focal deficit present.     Mental Status: She is alert and oriented to person, place, and time.     Labs on Admission: I have personally  reviewed following labs and imaging studies  CBC: Recent Labs  Lab 02/07/21 1545 02/14/21 0109 02/14/21 0501  WBC 10.7 11.8*  --   NEUTROABS 8,753* 8.4*  --   HGB 13.5 15.0 16.7*  HCT 41.4 46.9* 49.0*  MCV 92.6 91.6  --   PLT 301 311  --    Basic Metabolic Panel: Recent Labs  Lab 02/14/21 0109 02/14/21 0501  NA 122* 125*  K 5.6* 6.6*  CL 83*  --   CO2 26  --   GLUCOSE 703*  --   BUN 35*  --   CREATININE 1.67*  --   CALCIUM 9.3  --    GFR: Estimated Creatinine Clearance: 43.3 mL/min (A) (by C-G formula based on SCr of 1.67 mg/dL (H)). Liver Function Tests: No results for input(s): AST, ALT, ALKPHOS, BILITOT, PROT, ALBUMIN in the last 168 hours. No results for input(s): LIPASE, AMYLASE in the last 168 hours. No results for input(s): AMMONIA in the last 168 hours. Coagulation Profile: No results for input(s): INR, PROTIME in  the last 168 hours. Cardiac Enzymes: No results for input(s): CKTOTAL, CKMB, CKMBINDEX, TROPONINI in the last 168 hours. BNP (last 3 results) Recent Labs    09/26/20 1518 11/28/20 1556  PROBNP 43.0 50.0   HbA1C: No results for input(s): HGBA1C in the last 72 hours. CBG: Recent Labs  Lab 02/14/21 0436 02/14/21 0515 02/14/21 0553  GLUCAP >600* 542* 429*   Lipid Profile: No results for input(s): CHOL, HDL, LDLCALC, TRIG, CHOLHDL, LDLDIRECT in the last 72 hours. Thyroid Function Tests: No results for input(s): TSH, T4TOTAL, FREET4, T3FREE, THYROIDAB in the last 72 hours. Anemia Panel: No results for input(s): VITAMINB12, FOLATE, FERRITIN, TIBC, IRON, RETICCTPCT in the last 72 hours. Urine analysis:    Component Value Date/Time   COLORURINE STRAW (A) 03/12/2019 1906   APPEARANCEUR CLEAR 03/12/2019 1906   LABSPEC 1.009 03/12/2019 1906   PHURINE 6.0 03/12/2019 1906   GLUCOSEU 150 (A) 03/12/2019 1906   HGBUR NEGATIVE 03/12/2019 1906   BILIRUBINUR negative 12/24/2020 1104   BILIRUBINUR negative 12/25/2019 1515   KETONESUR trace (5) (A)  12/24/2020 1104   KETONESUR NEGATIVE 03/12/2019 1906   PROTEINUR =100 (A) 12/24/2020 1104   PROTEINUR Positive (A) 12/25/2019 1515   PROTEINUR NEGATIVE 03/12/2019 1906   UROBILINOGEN 0.2 12/24/2020 1104   UROBILINOGEN 0.2 09/19/2008 1400   NITRITE Negative 12/24/2020 1104   NITRITE negative 12/25/2019 1515   NITRITE NEGATIVE 03/12/2019 1906   LEUKOCYTESUR Small (1+) (A) 12/24/2020 1104   LEUKOCYTESUR NEGATIVE 03/12/2019 1906    Radiological Exams on Admission: DG Chest Port 1 View  Result Date: 02/14/2021 CLINICAL DATA:  Shortness of breath EXAM: PORTABLE CHEST 1 VIEW COMPARISON:  02/07/2021 FINDINGS: Cardiac shadow is mildly prominent. Small left pleural effusion is noted. No focal infiltrate is seen. No bony abnormality is noted. IMPRESSION: Small left pleural effusion. Electronically Signed   By: Inez Catalina M.D.   On: 02/14/2021 02:54    EKG: Independently reviewed.  Sinus rhythm, RBBB.  No significant change since prior tracing.  Assessment/Plan Principal Problem:   Hyperglycemia Active Problems:   Essential hypertension   Depression   Chronic diastolic heart failure (HCC)   COPD with acute exacerbation (HCC)   Severe hyperglycemia Insulin-dependent type 2 diabetes Hyperglycemia likely due to steroid use.  She was started on prednisone a week ago for COPD exacerbation.  Blood glucose 703 on initial labs.  No signs of DKA.  Bicarb and anion gap normal.  Blood gas without evidence of metabolic acidosis. ?HHS. -Keep n.p.o. Continue insulin infusion and IV fluids.  Check serum osmolarity.  Check A1c.  Monitor BMP every 4 hours.  Acute hypoxic respiratory failure secondary to acute exacerbation of COPD/asthma Likely precipitated by influenza A infection.  Oxygen saturation in the 80s on room air in the ED.  Chest x-ray showing small left pleural effusion; no focal infiltrate.  Patient received bronchodilator treatments and IV Solu-Medrol 125 mg.  Continues to have wheezing but  satting well on 2 L supplemental oxygen. -Avoid additional systemic steroids at this time given severe hyperglycemia.  Continue DuoNeb every 6 hours, albuterol neb every 2 hours prn, Pulmicort neb twice daily, Mucinex DM.  Recently treated with oral antibiotics.  Check procalcitonin level.  Pulmonary hygiene.  Continue supplemental oxygen, wean as tolerated.  Influenza A infection Chest x-ray not suggestive of pneumonia. -Start 5-day course of Tamiflu.  Droplet precautions.  AKI on CKD stage III Likely prerenal azotemia from dehydration in the setting of acute viral illness.  BUN 35.  Creatinine  1.6, baseline 1.0-1.4. -IV fluid hydration.  Monitor renal function and urine output.  Avoid nephrotoxic agents.  Hold home Lasix, metolazone, and spironolactone.  Mild leukocytosis Likely reactive from steroid use.  No fever. -Continue to monitor  Pseudohyponatremia Due to severe hyperglycemia.  Corrected sodium level 132. -Continue to monitor  Mild hyperkalemia No acute EKG changes. -On insulin infusion, continue to monitor  Chronic diastolic CHF No signs of volume overload this time. -Hold diuretics given AKI.  Monitor volume status closely.  Depression -Continue bupropion and duloxetine  GERD -Continue Protonix  Hypertension Stable. -Continue propranolol  RLS -Continue Requip  Chronic pain/ diabetic neuropathy -Continue gabapentin  DVT prophylaxis: Lovenox Code Status: Patient wishes to be full code. Family Communication: No family available at this time. Disposition Plan: Status is: Observation  The patient remains OBS appropriate and will d/c before 2 midnights.  Level of care: Level of care: Progressive  The medical decision making on this patient was of high complexity and the patient is at high risk for clinical deterioration, therefore this is a level 3 visit.  Shela Leff MD [facility]  If 7PM-7AM, please contact  night-coverage [email or web address]  02/14/2021, 6:15 AM

## 2021-02-14 NOTE — Progress Notes (Signed)
Inpatient Diabetes Program Recommendations  AACE/ADA: New Consensus Statement on Inpatient Glycemic Control (2015)  Target Ranges:  Prepandial:   less than 140 mg/dL      Peak postprandial:   less than 180 mg/dL (1-2 hours)      Critically ill patients:  140 - 180 mg/dL   Lab Results  Component Value Date   GLUCAP 202 (H) 02/14/2021   HGBA1C 10.9 (H) 02/14/2021    Review of Glycemic Control  Latest Reference Range & Units 02/14/21 08:24 02/14/21 10:03 02/14/21 11:50 02/14/21 13:11  Glucose-Capillary 70 - 99 mg/dL 320 (H) 235 (H) 218 (H) 202 (H)  (H): Data is abnormally high Diabetes history: Type 2 DM Outpatient Diabetes medications: Tresiba 549 units QD, Trulicity 4.5 mg Qwk Current orders for Inpatient glycemic control: IV insulin  Inpatient Diabetes Program Recommendations:    Continue with IV insulin insulin.   Spoke with patient regarding outpatient diabetes management. Patient has been feeling poorly for the last few weeks and had recently completed a steroid dose pack. Verified home medications, recently was prescribed Trulicity, however, had not starting administering yet. PCP recently increased Tresiba from 90 units to 100 units.  Reviewed patient's current A1c of 10.9% up from 9.7%. Explained what a A1c is and what it measures. Also reviewed goal A1c with patient, importance of good glucose control @ home, and blood sugar goals. Reviewed patho of DM, need for insulin, impact of steroids to glucose trends, possible need for insulin dose adjustments, vascular changes and commorbidities.  Patient will need a meter at discharge. Blood glucose meter (#[id number]). Reviewed frequency for checking blood sugars with patient and when to call Md. She reports not checking because she felt so poorly and was overwhelmed by everything. Encouragement given. Briefly reviewed the impact of being mindful with CHO intake and alternatives for sugary beverages. Dietitian consult placed. No further  questions at this time.   Thanks, Bronson Curb, MSN, RNC-OB Diabetes Coordinator [phone] (8a-5p)

## 2021-02-14 NOTE — ED Provider Notes (Signed)
Emergency Medicine Provider Triage Evaluation Note  Kathleen Hatfield , a 65 y.o. female  was evaluated in triage.  Pt complains of SHOB, brought in by EMS who gave 125 solumedrol, duoneb and albuterol. Patient states that she called EMS tonight when her O2 sats were in the 56s. Seen at [facility] and [facility] for same, given prednisone and antibiotics. Exposed to husband who tested positive for the flu, patient was negative on last test.   Review of Systems  Positive: Shortness of breath, cough (nonproductive) Negative: Chest pain  Physical Exam  There were no vitals taken for this visit. Gen:   Awake Resp:  Tachypneic MSK:   Moves extremities without difficulty  Other:  Wheezing, diminished  Medical Decision Making  Medically screening exam initiated at 12:48 AM.  Appropriate orders placed.  KINJAL NEITZKE was informed that the remainder of the evaluation will be completed by another provider, this initial triage assessment does not replace that evaluation, and the importance of remaining in the ED until their evaluation is complete.     Tacy Learn, PA-C 16/01/09 [id number]    Delora Fuel, MD 57/32/20 [phone]

## 2021-02-14 NOTE — ED Triage Notes (Addendum)
Patient here with shortness of breath, no chest pain.  Patient does have a cough, which has been going on for a few weeks, has a hard time catching her breath during the coughing episodes.  Patient with history of COPD, CHF.  Patient was given albuterol neb and duoneb and 157m solumedrol.

## 2021-02-14 NOTE — Progress Notes (Signed)
Patient seen and examined this morning, admitted overnight, H&P reviewed and review assessment and plan.  65 year old female with COPD/asthma, chronic diastolic CHF, chronic kidney disease stage IIIa, depression, IDDM, hypertension, hyperlipidemia, RLS, OSA on CPAP, ulcerative colitis, comes into the hospital with shortness of breath.  Patient has been feeling sick for the past week, went to the ER last week, was given doxycycline as well as prednisone and was sent home.  She tells me that she continues to feel worse, more and more short of breath and decided come back to the hospital.  In the ED she was found to be hypoxic requiring supplemental oxygen, wheezing, and was also found to have significant hyperglycemia with sugars in the 700s without anion gap.  She tested positive for influenza A.  She was placed on insulin infusion, Tamiflu and admitted to the hospital.   Acute hypoxic respiratory failure due to acute COPD/asthma exacerbation due to influenza A-still has some wheezing this morning, continue bronchodilators Pulmicort, Brovana, add a quick steroid taper.  She has been placed on Tamiflu.  Insulin-dependent type 2 diabetes mellitus, hyperglycemic state-she is not in DKA, has no anion gap.  Has been placed on insulin infusion, continue.  Upon improvement, will transition to subcutaneous insulin.  She is poorly controlled with A1c in the 10 range.  Chronic kidney disease stage IIIa-Baseline creatinine 1.0-1.4, close to baseline at 1.6 on admission.  Monitor, stable  Leukocytosis-from steroids.  Hyperkalemia-continue insulin infusion  Chronic diastolic CHF-euvolemic  Hypertension-continue home medications  Diabetic neuropathy -continue gabapentin  RLS-continue Requip  Scheduled Meds:  arformoterol  15 mcg Nebulization BID   budesonide (PULMICORT) nebulizer solution  0.25 mg Nebulization BID   buPROPion  300 mg Oral Daily   dextromethorphan-guaiFENesin  1 tablet Oral BID    DULoxetine  60 mg Oral QHS   enoxaparin (LOVENOX) injection  40 mg Subcutaneous Q24H   gabapentin  300 mg Oral QHS   ipratropium-albuterol  3 mL Nebulization Q6H   oseltamivir  30 mg Oral BID   pantoprazole  40 mg Oral Daily   [START ON 02/15/2021] predniSONE  30 mg Oral Q breakfast   Followed by   Derrill Memo ON 02/17/2021] predniSONE  20 mg Oral Q breakfast   Followed by   Derrill Memo ON 02/19/2021] predniSONE  10 mg Oral Q breakfast   propranolol  10 mg Oral BID   rOPINIRole  4 mg Oral BID   Continuous Infusions:  dextrose 5% lactated ringers Stopped (02/14/21 0659)   insulin 6 Units/hr (02/14/21 0640)   lactated ringers 125 mL/hr at 02/14/21 1001   PRN Meds:.acetaminophen **OR** acetaminophen, albuterol, dextrose  Lisvet Rasheed M. Cruzita Lederer, MD, PhD [facility]  Between 7 am - 7 pm you can contact me via Amion (for emergencies) or [place] (non urgent matters).  I am not available 7 pm - 7 am, please contact night coverage MD/APP via Amion

## 2021-02-14 NOTE — Evaluation (Signed)
Occupational Therapy Evaluation Patient Details Name: Kathleen Hatfield MRN: [id number] DOB: Apr 22, 1955 Today's Date: 02/14/2021   History of Present Illness This 65 y.o. female presented to ED with SOB and cough and decreased 02 sats - in the 70s.  Dx: acute hypoxic respiratory failure secondary to acute exacerbation of COPD/asthma likely precipitated by Influenza A.  She was also found to have severe hyperglycemia (703), likely due to steriod use.  PMH includes:  CKD stage III, chronic diastolic CHF, COPD with asthma, depression, insulin-dependent type 2 diabetes, GERD, hyperlipidemia, hypertension, RLS, OSA on CPAP, ulcerative colitis, peripheral neuropathy.   Clinical Impression   Pt admitted with above. She demonstrates the below listed deficits and will benefit from continued OT to maximize safety and independence with BADLs.  Pt presents to OT with generalized weakness, decreased activity tolerance, impaired balance, mildly impaired cognition.  She currently requires min A for LB ADLs and min guard assist for short distance ambulation.  She fatigues rapidly with activity, but VSS with pt on 2L 02.  Pt reports she lives with her boyfriend, who can assist as needed at discharge.  She reports she required intermittent assist with ADLs, assist with IADLs PTA.   Recommend HHOT.         Recommendations for follow up therapy are one component of a multi-disciplinary discharge planning process, led by the attending physician.  Recommendations may be updated based on patient status, additional functional criteria and insurance authorization.   Follow Up Recommendations  Home health OT    Assistance Recommended at Discharge Frequent or constant Supervision/Assistance  Functional Status Assessment     Equipment Recommendations  None recommended by OT    Recommendations for Other Services       Precautions / Restrictions Precautions Precautions: Fall Restrictions Weight Bearing Restrictions: No       Mobility Bed Mobility Overal bed mobility: Needs Assistance Bed Mobility: Supine to Sit;Sit to Supine     Supine to sit: Supervision;HOB elevated Sit to supine: Supervision   General bed mobility comments: Supervision for safety and line management    Transfers Overall transfer level: Needs assistance Equipment used: Rolling walker (2 wheels) Transfers: Sit to/from Stand Sit to Stand: Min guard           General transfer comment: Min guard for safety.      Balance Overall balance assessment: Needs assistance Sitting-balance support: No upper extremity supported;Feet supported Sitting balance-Leahy Scale: Good     Standing balance support: Bilateral upper extremity supported;During functional activity Standing balance-Leahy Scale: Poor Standing balance comment: Reliant on BUE support                           ADL either performed or assessed with clinical judgement   ADL Overall ADL's : Needs assistance/impaired Eating/Feeding: Independent   Grooming: Wash/dry hands;Wash/dry face;Oral care;Brushing hair;Set up;Sitting   Upper Body Bathing: Set up;Supervision/ safety;Sitting   Lower Body Bathing: Minimal assistance;Sit to/from stand   Upper Body Dressing : Set up;Sitting   Lower Body Dressing: Minimal assistance;Sit to/from stand   Toilet Transfer: Min guard;Rolling walker (2 wheels);Stand-pivot;BSC/3in1   Toileting- Water quality scientist and Hygiene: Minimal assistance;Sit to/from stand       Functional mobility during ADLs: Min guard;Rolling walker (2 wheels) General ADL Comments: pt fatiques quickly with activity     Vision Baseline Vision/History: 1 Wears glasses Ability to See in Adequate Light: 0 Adequate Patient Visual Report: No change from baseline  Perception     Praxis      Pertinent Vitals/Pain Pain Assessment: Faces Faces Pain Scale: No hurt     Hand Dominance     Extremity/Trunk Assessment Upper  Extremity Assessment Upper Extremity Assessment: Generalized weakness   Lower Extremity Assessment Lower Extremity Assessment: Defer to PT evaluation   Cervical / Trunk Assessment Cervical / Trunk Assessment: Normal   Communication Communication Communication: No difficulties   Cognition Arousal/Alertness: Awake/alert Behavior During Therapy: WFL for tasks assessed/performed Overall Cognitive Status: No family/caregiver present to determine baseline cognitive functioning                                 General Comments: Slow to respond to questions     General Comments  VSS on 2L 02    Exercises     Shoulder Instructions      Home Living Family/patient expects to be discharged to:: Private residence Living Arrangements: Spouse/significant other Available Help at Discharge: Family;Available 24 hours/day Type of Home: House Home Access: Stairs to enter [employer] of Steps: 2 Entrance Stairs-Rails: Left Home Layout: Other (Comment) Alternate Level Stairs-Number of Steps: 4 Alternate Level Stairs-Rails: Right Bathroom Shower/Tub: Occupational psychologist: Handicapped height     Home Equipment: Shower seat - built in;Grab bars - tub/shower;Rollator (4 wheels);Crutches          Prior Functioning/Environment Prior Level of Function : Needs assist             Mobility Comments: Independent with ambulation ADLs Comments: Needs assist with IADLs.        OT Problem List: Decreased strength;Decreased activity tolerance;Impaired balance (sitting and/or standing);Decreased safety awareness;Decreased knowledge of use of DME or AE;Cardiopulmonary status limiting activity;Obesity      OT Treatment/Interventions: Self-care/ADL training;Therapeutic exercise;Energy conservation;DME and/or AE instruction;Therapeutic activities;Patient/family education;Balance training    OT Goals(Current goals can be found in the care plan section) Acute  Rehab OT Goals Patient Stated Goal: to have better endurance OT Goal Formulation: With patient Time For Goal Achievement: 02/28/21 Potential to Achieve Goals: Good ADL Goals Pt Will Perform Grooming: with supervision;standing Pt Will Perform Upper Body Bathing: with set-up;with supervision;standing;sitting Pt Will Perform Lower Body Bathing: with supervision;sit to/from stand;with adaptive equipment Pt Will Perform Upper Body Dressing: with set-up;with supervision;sitting Pt Will Perform Lower Body Dressing: with supervision;sit to/from stand;with adaptive equipment Pt Will Transfer to Toilet: with supervision;ambulating;regular height toilet;bedside commode;grab bars Pt Will Perform Toileting - Clothing Manipulation and hygiene: with supervision;sit to/from stand Additional ADL Goal #1: Pt will independently incorporate energy conservation strategies during ADLs  OT Frequency: Min 2X/week   Barriers to D/C:            Co-evaluation PT/OT/SLP Co-Evaluation/Treatment: Yes Reason for Co-Treatment: For patient/therapist safety;To address functional/ADL transfers PT goals addressed during session: Mobility/safety with mobility;Balance OT goals addressed during session: ADL's and self-care      AM-PAC OT "6 Clicks" Daily Activity     Outcome Measure Help from another person eating meals?: None Help from another person taking care of personal grooming?: A Little Help from another person toileting, which includes using toliet, bedpan, or urinal?: A Little Help from another person bathing (including washing, rinsing, drying)?: A Little Help from another person to put on and taking off regular upper body clothing?: A Little Help from another person to put on and taking off regular lower body clothing?: A Little 6 Click Score: 19  End of Session Equipment Utilized During Treatment: Gait belt;Rolling walker (2 wheels);Oxygen Nurse Communication: Mobility status  Activity Tolerance:  Patient limited by fatigue Patient left: in bed;with call bell/phone within reach  OT Visit Diagnosis: Unsteadiness on feet (R26.81);Muscle weakness (generalized) (M62.81);Cognitive communication deficit (R41.841)                Time: 1241-1301 OT Time Calculation (min): 20 min Charges:  OT General Charges $OT Visit: 1 Visit OT Evaluation $OT Eval Moderate Complexity: 1 Mod  Nilsa Nutting., OTR/L Acute Rehabilitation Services Pager [phone] Office [place], [place] 02/14/2021, 2:35 PM

## 2021-02-14 NOTE — Progress Notes (Signed)
CBG 414, stat lab order placed to obtain serum glucose, MD notified and orders received. Will continue to monitor.

## 2021-02-14 NOTE — ED Provider Notes (Signed)
[facility] EMERGENCY DEPARTMENT Provider Note   CSN: [id number] Arrival date & time: 02/14/21  0046     History Chief Complaint  Patient presents with   Shortness of Breath    Kathleen Hatfield is a 65 y.o. female.  The history is provided by the patient.  Shortness of Breath She has history of hypertension, diabetes, hyperlipidemia, chronic kidney disease, diastolic heart failure, asthma and comes in because of ongoing problems with cough.  She has had cough for the last 2 weeks.  Her husband was diagnosed with influenza A.  She was seen several days ago and put on oral prednisone at home.  Cough got significantly worse tonight with associated dyspnea.  Cough is mainly nonproductive although there is occasionally some clear to yellow sputum.  She denies fever but has had chills and sweats as well as body aches.  She denies nausea or vomiting.  She came in by ambulance and did receive methylprednisolone as well as albuterol and ipratropium via nebulizer and received 1 nebulizer treatment in the ED prior to my seeing her.  She states that she has improved with this, but is nowhere near her baseline.   Past Medical History:  Diagnosis Date   Anemia    Arthritis    Asthma    Chronic diastolic CHF 62/2633   Echocardiogram 05/2019: EF 70, no RWMA, mild LVH, Gr 1 DD, normal RVSF, severe LVH, borderline asc Aorta (39 mm)   CKD (chronic kidney disease)    Depression    Diabetes mellitus without complication (HCC)    Diverticulitis    Dyspnea    with exertion   GERD (gastroesophageal reflux disease)    History of blood transfusion    Hyperlipidemia    cannot tolerate statins   Hypertension    patient states she has never had high blood pressure.    Nuclear stress test    Myoview 05/2019: EF 83, no ischemia or infarction; Low Risk   Peripheral neuropathy    Pneumonia    PONV (postoperative nausea and vomiting)    RLS (restless legs syndrome)    Sleep apnea    uses CPAP    Ulcerative colitis [facility])    dr Collene Mares    Patient Active Problem List   Diagnosis Date Noted   Rash 09/27/2020   COPD exacerbation ([place]) 09/21/2020   Acute exacerbation of COPD with asthma ([place]) 09/19/2020   Mixed diabetic hyperlipidemia associated with type 2 diabetes mellitus ([place]) 09/19/2020   Pain of foot 06/20/2020   Medicare annual wellness visit, initial 06/20/2020   Constipation 06/11/2020   Diverticulosis of colon 06/11/2020   Irritable bowel syndrome 35/45/6256   Periumbilical pain 38/93/7342   RLS (restless legs syndrome)    Acute asthma exacerbation 01/09/2020   Asthma exacerbation 01/08/2020   Muscle weakness 01/08/2020   Grade I diastolic dysfunction 87/68/1157   Statin myopathy 10/10/2019   Advance care planning 06/14/2019   RLQ abdominal pain 06/14/2019   COPD (chronic obstructive pulmonary disease) ([place]) 06/14/2019   Leukocytosis 06/14/2019   Hypercalcemia 06/14/2019   Gout 06/14/2019   Type 2 diabetes mellitus with hyperglycemia, without long-term current use of insulin (HCC)    COPD with acute exacerbation ([place]) 05/03/2017   Acute renal failure superimposed on stage 2 chronic kidney disease ([place]) 04/02/2016   Diabetes mellitus without complication (HCC)    CKD (chronic kidney disease)    Hypersomnia with sleep apnea 09/16/2015   Fatigue 06/07/2015   Morbid obesity ([place])  06/07/2015   Chronic diastolic heart failure (HCC)    Pneumonia of both lower lobes due to infectious organism 02/09/2013   OSA on CPAP 08/23/2012   Essential hypertension    Depression    GERD without esophagitis    Hyperlipidemia    Ulcerative colitis ([place])    Patellar tendinitis 05/25/2011   Knee pain 05/25/2011   Myofascial pain 05/25/2011   Cervicalgia 05/25/2011    Past Surgical History:  Procedure Laterality Date   ABDOMINAL HYSTERECTOMY     APPENDECTOMY     BREAST BIOPSY     BREAST SURGERY     left biopsy   CARDIAC CATHETERIZATION     CESAREAN SECTION      CHOLECYSTECTOMY     HERNIA REPAIR     INSERTION OF MESH N/A 11/15/2015   Procedure: INSERTION OF MESH;  Surgeon: [name], MD;  Location: [facility] ORS;  Service: General;  Laterality: N/A;   LAPAROSCOPIC LYSIS OF ADHESIONS N/A 11/15/2015   Procedure: LAPAROSCOPIC LYSIS OF ADHESIONS;  Surgeon: [name], MD;  Location: [facility] ORS;  Service: General;  Laterality: N/A;   RIGHT HEART CATHETERIZATION N/A 02/27/2013   Procedure: RIGHT HEART CATH;  Surgeon: Blane Ohara, MD;  Location: [facility] CATH LAB;  Service: Cardiovascular;  Laterality: N/A;   RIGHT HEART CATHETERIZATION N/A 12/14/2013   Procedure: RIGHT HEART CATH;  Surgeon: Larey Dresser, MD;  Location: [facility] CATH LAB;  Service: Cardiovascular;  Laterality: N/A;   SIGMOIDECTOMY  2010   diverticular disease   VENTRAL HERNIA REPAIR N/A 11/15/2015   Procedure: LAPAROSCOPIC VENTRAL WALL HERNIA REPAIR;  Surgeon: [name], MD;  Location: [facility] ORS;  Service: General;  Laterality: N/A;     OB History     Gravida  2   Para  1   Term      Preterm      AB  1   Living         SAB      IAB      Ectopic      Multiple      Live Births              Family History  Problem Relation Age of Onset   Heart disease Mother    Hypertension Mother    Dementia Mother    Heart disease Father    COPD Father    Hypertension Father    Heart disease Brother    Other Brother        knee replacement; hip replacement   Other Brother        cant brathe when laying down   Breast cancer Neg Hx    Colon cancer Neg Hx     Social History   Tobacco Use   Smoking status: Never   Smokeless tobacco: Never  Vaping Use   Vaping Use: Never used  Substance Use Topics   Alcohol use: No   Drug use: No    Home Medications Prior to Admission medications   Medication Sig Start Date End Date Taking? Authorizing Provider  acetaminophen (TYLENOL) 500 MG tablet Take 500 mg by mouth every 6 (six) hours as needed for moderate pain.    [provider]  albuterol (VENTOLIN HFA) 108 (90 Base) MCG/ACT inhaler Inhale 2 puffs into the lungs every 6 (six) hours as needed. 02/16/20   [name], MD  amoxicillin-clavulanate (AUGMENTIN) 875-125 MG tablet Take 1 tablet by mouth every 12 (twelve) hours. 02/08/21  Deatra Canter, Amjad, PA-C  BAYER CONTOUR NEXT TEST test strip 1 each by Other route daily. As directed 07/30/15   [provider]  budesonide (PULMICORT) 0.5 MG/2ML nebulizer solution Take 2 mLs (0.5 mg total) by nebulization 2 (two) times daily. 05/23/20   [name], MD  buPROPion (WELLBUTRIN XL) 300 MG 24 hr tablet TAKE 1 TABLET BY MOUTH DAILY 09/25/20   [name], MD  Cholecalciferol (VITAMIN D-3) 5000 UNITS TABS Take 5,000 Units by mouth every other day.     [provider]  colchicine 0.6 MG tablet Take 1 tablet (0.6 mg total) by mouth daily as needed (for gout). 08/15/20   [name], MD  dicyclomine (BENTYL) 20 MG tablet TAKE 1 TABLET UP TO FOUR TIMES A DAY FOR GI CRAMPING, PAIN, AND NAUSEA/VOMITING AS NEEDED 11/26/20   [name], MD  doxycycline (VIBRA-TABS) 100 MG tablet Take 1 tablet (100 mg total) by mouth 2 (two) times daily. 02/07/21   Pleas Koch, NP  Dulaglutide (TRULICITY) 4.5 YY/5.0PT SOPN Inject 4.5 mg as directed once a week. 02/04/21   [name], MD  DULoxetine (CYMBALTA) 60 MG capsule TAKE 1 CAPSULE BY MOUTH ONCE DAILY. 01/01/21   [name], MD  fluticasone Asencion Islam) 50 MCG/ACT nasal spray Place 2 sprays into both nostrils daily. 02/08/21   Evlyn Courier, PA-C  furosemide (LASIX) 20 MG tablet Take 3 tablets (60 mg total) by mouth 2 (two) times daily. 05/13/20   Richardson Dopp T, PA-C  gabapentin (NEURONTIN) 100 MG capsule TAKE 3 CAPSULES BY MOUTH AT BEDTIME. 09/25/20   [name], MD  GLOBAL EASE INJECT PEN NEEDLES 32G X 4 MM MISC USE AS DIRECTED WITH VICTOZA AND TRESIBA. 08/12/20   [name], MD  insulin degludec (TRESIBA FLEXTOUCH) 200 UNIT/ML FlexTouch Pen  INJECT 90 UNITS UNDER SKIN ONCE DAILY 11/26/20   [name], MD  ipratropium-albuterol (DUONEB) 0.5-2.5 (3) MG/3ML SOLN Take 3 mLs by nebulization every 6 (six) hours as needed (wheezing). 02/10/21   [name], MD  Iron, Ferrous Sulfate, 325 (65 Fe) MG TABS Take 325 mg by mouth daily. 12/01/20   [name], MD  mesalamine (LIALDA) 1.2 g EC tablet Take 1.2 g by mouth 2 (two) times daily.    [provider]  metolazone (ZAROXOLYN) 5 MG tablet TAKE 1 TABLET BY MOUTH AS NEEDED. 08/19/20   [name], MD  MICROLET LANCETS MISC 1 each by Other route. As directed 07/30/15   [provider]  nystatin (MYCOSTATIN/NYSTOP) powder Apply 1 application topically 3 (three) times daily. 09/26/20   [name], MD  ondansetron (ZOFRAN) 4 MG tablet Take 1 tablet (4 mg total) by mouth every 6 (six) hours. 05/23/20   Faustino Congress, NP  oxyCODONE-acetaminophen (PERCOCET/ROXICET) 5-325 MG tablet Take 1 tablet by mouth every 6 (six) hours as needed for severe pain. 12/12/20   Blanchie Dessert, MD  pantoprazole (PROTONIX) 40 MG tablet TAKE 1 TABLET BY MOUTH AT BEDTIME 08/21/20   [name], MD  polyethylene glycol [facility] Floria Raveling) packet Take 17 g by mouth daily as needed for mild constipation.    [provider]  potassium chloride (MICRO-K) 10 MEQ CR capsule TAKE 5 CAPSULES (50 MEQ TOTAL) BY MOUTH 2 (TWO) TIMES DAILY 12/16/20   Richardson Dopp T, PA-C  predniSONE (DELTASONE) 20 MG tablet Take 2 tablets by mouth once daily for five days, then 1 tablet by mouth for five days. 02/07/21  Pleas Koch, NP  propranolol (INDERAL) 10 MG tablet TAKE 1 TABLET BY MOUTH 2 TIMES DAILY 01/01/21   [name], MD  revefenacin [facility]) 175 MCG/3ML nebulizer solution Take 3 mLs (175 mcg total) by nebulization daily. 05/20/20   [name], MD  rOPINIRole (REQUIP) 4 MG tablet TAKE 1 TABLET BY MOUTH TWICE DAILY 01/01/21   [name], MD  spironolactone (ALDACTONE)  25 MG tablet TAKE 1 TABLET BY MOUTH DAILY. 11/21/20   [name], MD  traMADol (ULTRAM) 50 MG tablet Take 1 tablet (50 mg total) by mouth 3 (three) times daily as needed. 06/18/20   [name], MD  UNABLE TO FIND CPAP- At bedtime    [provider]    Allergies    Almond oil, Morphine and related, Atorvastatin, Ceclor [cefaclor], Sulfa antibiotics, Ciprofloxacin, Levaquin [levofloxacin], Losartan, and Statins  Review of Systems   Review of Systems  Respiratory:  Positive for shortness of breath.   All other systems reviewed and are negative.  Physical Exam Updated Vital Signs BP 109/89   Pulse 83   Temp 98.4 F (36.9 C) (Oral)   Resp 15   SpO2 91%   Physical Exam Vitals and nursing note reviewed.  65 year old female, resting comfortably and in no acute distress. Vital signs are normal. Oxygen saturation is 91%, which is borderline hypoxic. Head is normocephalic and atraumatic. PERRLA, EOMI. Oropharynx is clear. Neck is nontender and supple without adenopathy or JVD. Back is nontender and there is no CVA tenderness. Lungs have faint expiratory wheezes without rales or rhonchi. Chest is nontender. Heart has regular rate and rhythm without murmur. Abdomen is soft, flat, nontender. Extremities have no cyanosis or edema, full range of motion is present. Skin is warm and dry without rash. Neurologic: Mental status is normal, cranial nerves are intact, moves all extremities equally.  ED Results / Procedures / Treatments   Labs (all labs ordered are listed, but only abnormal results are displayed) Labs Reviewed  RESP PANEL BY RT-PCR (FLU A&B, COVID) ARPGX2 - Abnormal; Notable for the following components:      Result Value   Influenza A by PCR POSITIVE (*)    All other components within normal limits  BASIC METABOLIC PANEL - Abnormal; Notable for the following components:   Sodium 122 (*)    Potassium 5.6 (*)    Chloride 83 (*)    Glucose, Bld 703 (*)     BUN 35 (*)    Creatinine, Ser 1.67 (*)    GFR, Estimated 34 (*)    All other components within normal limits  CBC WITH DIFFERENTIAL/PLATELET - Abnormal; Notable for the following components:   WBC 11.8 (*)    RBC 5.12 (*)    HCT 46.9 (*)    Neutro Abs 8.4 (*)    Abs Immature Granulocytes 0.22 (*)    All other components within normal limits  BRAIN NATRIURETIC PEPTIDE  I-STAT VENOUS BLOOD GAS, ED   Radiology DG Chest Port 1 View  Result Date: 02/14/2021 CLINICAL DATA:  Shortness of breath EXAM: PORTABLE CHEST 1 VIEW COMPARISON:  02/07/2021 FINDINGS: Cardiac shadow is mildly prominent. Small left pleural effusion is noted. No focal infiltrate is seen. No bony abnormality is noted. IMPRESSION: Small left pleural effusion. Electronically Signed   By: Inez Catalina M.D.   On: 02/14/2021 02:54    Procedures Procedures  CRITICAL CARE Performed by: Delora Fuel Total critical care time: 45 minutes Critical care time  was exclusive of separately billable procedures and treating other patients. Critical care was necessary to treat or prevent imminent or life-threatening deterioration. Critical care was time spent personally by me on the following activities: development of treatment plan with patient and/or surrogate as well as nursing, discussions with consultants, evaluation of patient's response to treatment, examination of patient, obtaining history from patient or surrogate, ordering and performing treatments and interventions, ordering and review of laboratory studies, ordering and review of radiographic studies, pulse oximetry and re-evaluation of patient's condition.  Medications Ordered in ED Medications  lactated ringers bolus 1,000 mL (has no administration in time range)  insulin aspart (novoLOG) injection 10 Units (has no administration in time range)  albuterol (PROVENTIL) (2.5 MG/3ML) 0.083% nebulizer solution (10 mg/hr Nebulization Given 02/14/21 0117)    ED Course  I have  reviewed the triage vital signs and the nursing notes.  Pertinent labs & imaging results that were available during my care of the patient were reviewed by me and considered in my medical decision making (see chart for details).   MDM Rules/Calculators/A&P                         Respiratory tract infection with bronchospasm which has failed to respond to outpatient management including oral steroids.  Old records are reviewed showing ED visit on 12/3 for COPD exacerbation, discharged with prescriptions for amoxicillin-clavulanate and fluticasone propionate inhaler.  Patient also states that she is on prednisone 40 mg a day.  Chest x-ray today shows no evidence of pneumonia.  Labs are significant for respiratory pathogen panel positive for influenza A, and marked hyperglycemia with glucose 703 with normal anion gap.  Hyponatremia is present appropriate for degree of hyperglycemia.  Renal insufficiency is present which is slightly worse than baseline.  I am concerned that she has this degree of hyperglycemia and had received methylprednisolone 125 mg in the ambulance.  Also, oxygen saturations are noted to dip periodically to 88%, she is not felt to be safe for discharge.  She is started on IV fluids and IV insulin for her hyperglycemia.  Case is discussed with Dr. Marlowe Sax of [facility], who agrees to admit the patient.  Final Clinical Impression(s) / ED Diagnoses Final diagnoses:  COPD exacerbation ([place])  Influenza A  Hyperglycemia  Acute on chronic renal insufficiency  Hyperkalemia    Rx / DC Orders ED Discharge Orders     None        Delora Fuel, MD 97/02/63 [phone]

## 2021-02-14 NOTE — ED Notes (Signed)
Pt provided happy meal with diet gingerale and insulin drip stopped per Dr. Gloris Ham.

## 2021-02-14 NOTE — Evaluation (Signed)
Physical Therapy Evaluation Patient Details Name: Kathleen Hatfield MRN: [id number] DOB: June 24, 1955 Today's Date: 02/14/2021  History of Present Illness  This 65 y.o. female presented to ED with SOB and cough and decreased 02 sats - in the 70s.  Dx: acute hypoxic respiratory failure secondary to acute exacerbation of COPD/asthma likely precipitated by Influenza A.  She was also found to have severe hyperglycemia (703), likely due to steriod use.  PMH includes:  CKD stage III, chronic diastolic CHF, COPD with asthma, depression, insulin-dependent type 2 diabetes, GERD, hyperlipidemia, hypertension, RLS, OSA on CPAP, ulcerative colitis, peripheral neuropathy.  Clinical Impression  Pt admitted secondary to problem above with deficits below. Pt only able to tolerate short ambulation distance and required seated rest in between gait trials. Slow processing noted throughout session. Educated about using rollator at home. Recommending HHPT at d/c to address current deficits. Will continue to follow acutely.        Recommendations for follow up therapy are one component of a multi-disciplinary discharge planning process, led by the attending physician.  Recommendations may be updated based on patient status, additional functional criteria and insurance authorization.  Follow Up Recommendations Home health PT    Assistance Recommended at Discharge Frequent or constant Supervision/Assistance  Functional Status Assessment Patient has had a recent decline in their functional status and demonstrates the ability to make significant improvements in function in a reasonable and predictable amount of time.  Equipment Recommendations  None recommended by PT    Recommendations for Other Services       Precautions / Restrictions Precautions Precautions: Fall Restrictions Weight Bearing Restrictions: No      Mobility  Bed Mobility Overal bed mobility: Needs Assistance Bed Mobility: Supine to Sit;Sit to  Supine     Supine to sit: Supervision;HOB elevated Sit to supine: Supervision   General bed mobility comments: Supervision for safety and line management    Transfers Overall transfer level: Needs assistance Equipment used: Rolling walker (2 wheels) Transfers: Sit to/from Stand Sit to Stand: Min guard           General transfer comment: Min guard for safety.    Ambulation/Gait Ambulation/Gait assistance: Min guard Gait Distance (Feet): 5 Feet (X2) Assistive device: Rolling walker (2 wheels) Gait Pattern/deviations: Step-through pattern;Decreased stride length Gait velocity: Decreased     General Gait Details: Min guard for safety. Pt fatiguing very easily and required seated rest in between gait trials in ED.  Stairs            Wheelchair Mobility    Modified Rankin (Stroke Patients Only)       Balance Overall balance assessment: Needs assistance Sitting-balance support: No upper extremity supported;Feet supported Sitting balance-Leahy Scale: Good     Standing balance support: Bilateral upper extremity supported;During functional activity Standing balance-Leahy Scale: Poor Standing balance comment: Reliant on BUE support                             Pertinent Vitals/Pain Pain Assessment: Faces Faces Pain Scale: No hurt    Home Living Family/patient expects to be discharged to:: Private residence Living Arrangements: Spouse/significant other Available Help at Discharge: Family;Available 24 hours/day Type of Home: House Home Access: Stairs to enter Entrance Stairs-Rails: Left Entrance Stairs-Number of Steps: 2 Alternate Level Stairs-Number of Steps: 4 Home Layout: Other (Comment) Home Equipment: Shower seat - built in;Grab bars - tub/shower;Rollator (4 wheels);Crutches      Prior Function Prior Level  of Function : Needs assist             Mobility Comments: Independent with ambulation ADLs Comments: Needs assist with IADLs.      Hand Dominance        Extremity/Trunk Assessment   Upper Extremity Assessment Upper Extremity Assessment: Defer to OT evaluation    Lower Extremity Assessment Lower Extremity Assessment: Generalized weakness    Cervical / Trunk Assessment Cervical / Trunk Assessment: Normal  Communication   Communication: No difficulties  Cognition Arousal/Alertness: Awake/alert Behavior During Therapy: WFL for tasks assessed/performed Overall Cognitive Status: No family/caregiver present to determine baseline cognitive functioning                                 General Comments: Slow to respond to questions        General Comments      Exercises     Assessment/Plan    PT Assessment Patient needs continued PT services  PT Problem List Decreased strength;Decreased activity tolerance;Decreased balance;Decreased mobility;Decreased knowledge of use of DME;Decreased knowledge of precautions;Decreased cognition;Cardiopulmonary status limiting activity       PT Treatment Interventions DME instruction;Gait training;Stair training;Functional mobility training;Therapeutic activities;Therapeutic exercise;Balance training;Patient/family education    PT Goals (Current goals can be found in the Care Plan section)  Acute Rehab PT Goals Patient Stated Goal: to feel better PT Goal Formulation: With patient Time For Goal Achievement: 02/28/21 Potential to Achieve Goals: Good    Frequency Min 3X/week   Barriers to discharge        Co-evaluation PT/OT/SLP Co-Evaluation/Treatment: Yes Reason for Co-Treatment: For patient/therapist safety;To address functional/ADL transfers PT goals addressed during session: Mobility/safety with mobility;Balance         AM-PAC PT "6 Clicks" Mobility  Outcome Measure Help needed turning from your back to your side while in a flat bed without using bedrails?: None Help needed moving from lying on your back to sitting on the side of a flat  bed without using bedrails?: A Little Help needed moving to and from a bed to a chair (including a wheelchair)?: A Little Help needed standing up from a chair using your arms (e.g., wheelchair or bedside chair)?: A Little Help needed to walk in hospital room?: A Little Help needed climbing 3-5 steps with a railing? : A Little 6 Click Score: 19    End of Session Equipment Utilized During Treatment: Oxygen Activity Tolerance: Patient limited by fatigue Patient left: in bed;with call bell/phone within reach (on stretcher in ED) Nurse Communication: Mobility status PT Visit Diagnosis: Unsteadiness on feet (R26.81);Muscle weakness (generalized) (M62.81);Difficulty in walking, not elsewhere classified (R26.2)    Time: 1241-1301 PT Time Calculation (min) (ACUTE ONLY): 20 min   Charges:   PT Evaluation $PT Eval Moderate Complexity: 1 Mod          Reuel Derby, PT, DPT  Acute Rehabilitation Services  Pager: [phone] Office: [phone]   Rudean Hitt 02/14/2021, 2:08 PM

## 2021-02-14 NOTE — ED Notes (Signed)
IT Called for [employer]

## 2021-02-15 DIAGNOSIS — R739 Hyperglycemia, unspecified: Secondary | ICD-10-CM | POA: Diagnosis not present

## 2021-02-15 DIAGNOSIS — N1831 Chronic kidney disease, stage 3a: Secondary | ICD-10-CM | POA: Diagnosis present

## 2021-02-15 DIAGNOSIS — E860 Dehydration: Secondary | ICD-10-CM | POA: Diagnosis present

## 2021-02-15 DIAGNOSIS — E785 Hyperlipidemia, unspecified: Secondary | ICD-10-CM | POA: Diagnosis present

## 2021-02-15 DIAGNOSIS — N189 Chronic kidney disease, unspecified: Secondary | ICD-10-CM

## 2021-02-15 DIAGNOSIS — N289 Disorder of kidney and ureter, unspecified: Secondary | ICD-10-CM | POA: Diagnosis not present

## 2021-02-15 DIAGNOSIS — Z7722 Contact with and (suspected) exposure to environmental tobacco smoke (acute) (chronic): Secondary | ICD-10-CM | POA: Diagnosis present

## 2021-02-15 DIAGNOSIS — J441 Chronic obstructive pulmonary disease with (acute) exacerbation: Secondary | ICD-10-CM | POA: Diagnosis not present

## 2021-02-15 DIAGNOSIS — E875 Hyperkalemia: Secondary | ICD-10-CM | POA: Diagnosis not present

## 2021-02-15 DIAGNOSIS — J9621 Acute and chronic respiratory failure with hypoxia: Secondary | ICD-10-CM | POA: Diagnosis not present

## 2021-02-15 DIAGNOSIS — J45901 Unspecified asthma with (acute) exacerbation: Secondary | ICD-10-CM | POA: Diagnosis not present

## 2021-02-15 DIAGNOSIS — Z8249 Family history of ischemic heart disease and other diseases of the circulatory system: Secondary | ICD-10-CM | POA: Diagnosis not present

## 2021-02-15 DIAGNOSIS — J101 Influenza due to other identified influenza virus with other respiratory manifestations: Secondary | ICD-10-CM | POA: Diagnosis present

## 2021-02-15 DIAGNOSIS — I5032 Chronic diastolic (congestive) heart failure: Secondary | ICD-10-CM | POA: Diagnosis not present

## 2021-02-15 DIAGNOSIS — N390 Urinary tract infection, site not specified: Secondary | ICD-10-CM | POA: Diagnosis present

## 2021-02-15 DIAGNOSIS — K5190 Ulcerative colitis, unspecified, without complications: Secondary | ICD-10-CM | POA: Diagnosis present

## 2021-02-15 DIAGNOSIS — E1122 Type 2 diabetes mellitus with diabetic chronic kidney disease: Secondary | ICD-10-CM | POA: Diagnosis not present

## 2021-02-15 DIAGNOSIS — E1142 Type 2 diabetes mellitus with diabetic polyneuropathy: Secondary | ICD-10-CM | POA: Diagnosis present

## 2021-02-15 DIAGNOSIS — G2581 Restless legs syndrome: Secondary | ICD-10-CM | POA: Diagnosis present

## 2021-02-15 DIAGNOSIS — N179 Acute kidney failure, unspecified: Secondary | ICD-10-CM | POA: Diagnosis not present

## 2021-02-15 DIAGNOSIS — F32A Depression, unspecified: Secondary | ICD-10-CM | POA: Diagnosis present

## 2021-02-15 DIAGNOSIS — K219 Gastro-esophageal reflux disease without esophagitis: Secondary | ICD-10-CM | POA: Diagnosis present

## 2021-02-15 DIAGNOSIS — E1165 Type 2 diabetes mellitus with hyperglycemia: Secondary | ICD-10-CM | POA: Diagnosis present

## 2021-02-15 DIAGNOSIS — I130 Hypertensive heart and chronic kidney disease with heart failure and stage 1 through stage 4 chronic kidney disease, or unspecified chronic kidney disease: Secondary | ICD-10-CM | POA: Diagnosis not present

## 2021-02-15 DIAGNOSIS — G8929 Other chronic pain: Secondary | ICD-10-CM | POA: Diagnosis present

## 2021-02-15 DIAGNOSIS — Z20822 Contact with and (suspected) exposure to covid-19: Secondary | ICD-10-CM | POA: Diagnosis not present

## 2021-02-15 DIAGNOSIS — Z794 Long term (current) use of insulin: Secondary | ICD-10-CM | POA: Diagnosis not present

## 2021-02-15 DIAGNOSIS — E871 Hypo-osmolality and hyponatremia: Secondary | ICD-10-CM | POA: Diagnosis not present

## 2021-02-15 LAB — GLUCOSE, CAPILLARY
Glucose-Capillary: 170 mg/dL — ABNORMAL HIGH (ref 70–99)
Glucose-Capillary: 379 mg/dL — ABNORMAL HIGH (ref 70–99)
Glucose-Capillary: 323 mg/dL — ABNORMAL HIGH (ref 70–99)
Glucose-Capillary: 342 mg/dL — ABNORMAL HIGH (ref 70–99)

## 2021-02-15 LAB — URINALYSIS, ROUTINE W REFLEX MICROSCOPIC
Bacteria, UA: NONE SEEN
Bilirubin Urine: NEGATIVE
Glucose, UA: >=500 mg/dL — AB
Hgb urine dipstick: NEGATIVE
Ketones, ur: NEGATIVE mg/dL
Nitrite: NEGATIVE
Protein, ur: NEGATIVE mg/dL
Specific Gravity, Urine: 1.028 (ref 1.005–1.030)
pH: 5 (ref 5.0–8.0)

## 2021-02-15 MED ORDER — GUAIFENESIN-DM 100-10 MG/5ML PO SYRP
5.0000 mL | ORAL_SOLUTION | ORAL | Status: DC | PRN
  Administered 2021-02-15 – 2021-02-18 (×4): 5 mL via ORAL
  Filled 2021-02-15 (×4): qty 5

## 2021-02-15 MED ORDER — GUAIFENESIN ER 600 MG PO TB12: 600.0000 mg | ORAL_TABLET | Freq: Once | ORAL | Status: DC

## 2021-02-15 MED ORDER — INSULIN GLARGINE-YFGN 100 UNIT/ML ~~LOC~~ SOLN
40.0000 [IU] | Freq: Two times a day (BID) | SUBCUTANEOUS | Status: DC
  Administered 2021-02-15 – 2021-02-18 (×7): 40 [IU] via SUBCUTANEOUS
  Filled 2021-02-15 (×8): qty 0.4

## 2021-02-15 MED ORDER — BENZONATATE 100 MG PO CAPS
100.0000 mg | ORAL_CAPSULE | Freq: Once | ORAL | Status: AC
  Administered 2021-02-15: 100 mg via ORAL
  Filled 2021-02-15: qty 1

## 2021-02-15 MED ORDER — INSULIN ASPART 100 UNIT/ML IJ SOLN
4.0000 [IU] | Freq: Three times a day (TID) | INTRAMUSCULAR | Status: DC
  Administered 2021-02-15 – 2021-02-16 (×4): 4 [IU] via SUBCUTANEOUS

## 2021-02-15 NOTE — Plan of Care (Signed)

## 2021-02-15 NOTE — Progress Notes (Signed)
PROGRESS NOTE  Kathleen Hatfield NWG:[id number] DOB: 09/23/1955 DOA: 02/14/2021 PCP: [name], MD   LOS: 0 days   Brief Narrative / Interim history: 65 year old female with COPD/asthma, chronic diastolic CHF, chronic kidney disease stage IIIa, depression, IDDM, hypertension, hyperlipidemia, RLS, OSA on CPAP, ulcerative colitis, comes into the hospital with shortness of breath.  Patient has been feeling sick for the past week, went to the ER last week, was given doxycycline as well as prednisone and was sent home.  She tells me that she continues to feel worse, more and more short of breath and decided come back to the hospital.  In the ED she was found to be hypoxic requiring supplemental oxygen, wheezing, and was also found to have significant hyperglycemia with sugars in the 700s without anion gap.  She tested positive for influenza A.  She was placed on insulin infusion, Tamiflu and admitted to the hospital.  Subjective / 24h Interval events: Complains of persistent shortness of breath and coughing, is able to walk barely a few steps.  Remains hypoxic this morning  Assessment & Plan: Principal Problem Acute hypoxic respiratory failure due to acute COPD/asthma exacerbation due to influenza A-remains hypoxic this morning.  I suspect a degree of chronic hypoxemic respiratory failure at home given reported shortness of breath with exertion for quite some time.  We will do ambulatory sats when closer to discharge.  For now continue steroids, bronchodilators, Tamiflu.  Active Problems Insulin-dependent type 2 diabetes mellitus, hyperglycemic state-transition to subcutaneous insulin yesterday, CBGs up into the 400s by the end of the day.  Fasting this morning better at 170.  Increase insulin regimen  CBG (last 3)  Recent Labs    02/14/21 1747 02/14/21 2115 02/15/21 0608  GLUCAP 383* 414* 170*     Chronic kidney disease stage IIIa-Baseline creatinine 1.0-1.4, close to baseline at 1.6 on  admission.  Better now, most recent creatinine 1.27   Leukocytosis-from steroids /viral infection   Hyperkalemia-resolved, potassium 4.4   Chronic diastolic CHF-euvolemic   Hypertension-continue home medications  Diabetic neuropathy -continue gabapentin   RLS-continue Requip  Scheduled Meds:  arformoterol  15 mcg Nebulization BID   budesonide (PULMICORT) nebulizer solution  0.25 mg Nebulization BID   buPROPion  300 mg Oral Daily   dextromethorphan-guaiFENesin  1 tablet Oral BID   DULoxetine  60 mg Oral QHS   enoxaparin (LOVENOX) injection  40 mg Subcutaneous Q24H   gabapentin  300 mg Oral QHS   insulin aspart  0-20 Units Subcutaneous TID WC   insulin aspart  0-5 Units Subcutaneous QHS   insulin aspart  4 Units Subcutaneous TID WC   insulin glargine-yfgn  40 Units Subcutaneous BID   ipratropium-albuterol  3 mL Nebulization Q6H   oseltamivir  30 mg Oral BID   pantoprazole  40 mg Oral Daily   predniSONE  30 mg Oral Q breakfast   Followed by   Derrill Memo ON 02/17/2021] predniSONE  20 mg Oral Q breakfast   Followed by   Derrill Memo ON 02/19/2021] predniSONE  10 mg Oral Q breakfast   propranolol  10 mg Oral BID   rOPINIRole  4 mg Oral BID   Continuous Infusions: PRN Meds:.acetaminophen **OR** acetaminophen, albuterol, dextrose, guaiFENesin-dextromethorphan  Diet Orders (From admission, onward)     Start     Ordered   02/14/21 1355  Diet Heart Room service appropriate? Yes; Fluid consistency: Thin  Diet effective now       Question Answer Comment  Room service appropriate?  Yes   Fluid consistency: Thin      02/14/21 1354            DVT prophylaxis: enoxaparin (LOVENOX) injection 40 mg Start: 02/14/21 1600     Code Status: Full Code  Family Communication: No family at bedside  Status is: Observation  The patient will require care spanning > 2 midnights and should be moved to inpatient because: Persistent hypoxic respiratory failure, DOE, wheezing  Level of care:  Telemetry Medical  Consultants:  none  Procedures:  none  Microbiology  Flu A  Antimicrobials: Tamiflu    Objective: Vitals:   02/15/21 0021 02/15/21 0431 02/15/21 0744 02/15/21 0801  BP: (!) 142/98 132/85 117/80   Pulse: 73 77 84   Resp:  18 18   Temp: 97.6 F (36.4 C) 97.6 F (36.4 C)    TempSrc: Oral Oral    SpO2: 97% 93% 90% 93%  Weight:  118.7 kg    Height:        Intake/Output Summary (Last 24 hours) at 02/15/2021 1017 Last data filed at 02/15/2021 0820 Gross per 24 hour  Intake 1080 ml  Output 300 ml  Net 780 ml   Filed Weights   02/14/21 1712 02/15/21 0431  Weight: 118.7 kg 118.7 kg    Examination:  Constitutional: NAD Eyes: no scleral icterus ENMT: Mucous membranes are moist.  Neck: normal, supple Respiratory: Faint end expiratory wheezing, moves air well Cardiovascular: Regular rate and rhythm, no murmurs / rubs / gallops.  No edema Abdomen: non distended, no tenderness. Bowel sounds positive.  Musculoskeletal: no clubbing / cyanosis.  Skin: no rashes Neurologic: CN 2-12 grossly intact. Strength 5/5 in all 4.   Data Reviewed: I have independently reviewed following labs and imaging studies   CBC: Recent Labs  Lab 02/14/21 0109 02/14/21 0501 02/14/21 0659  WBC 11.8*  --  11.9*  NEUTROABS 8.4*  --   --   HGB 15.0 16.7* 14.7  HCT 46.9* 49.0* 44.4  MCV 91.6  --  89.9  PLT 311  --  952   Basic Metabolic Panel: Recent Labs  Lab 02/14/21 0109 02/14/21 0501 02/14/21 0659 02/14/21 1010 02/14/21 2140  NA 122* 125* 131* 132*  --   K 5.6* 6.6* 4.6 4.4  --   CL 83*  --  91* 93*  --   CO2 26  --  24 27  --   GLUCOSE 703*  --  334* 231* 408*  BUN 35*  --  34* 30*  --   CREATININE 1.67*  --  1.43* 1.27*  --   CALCIUM 9.3  --  9.4 9.7  --    Liver Function Tests: No results for input(s): AST, ALT, ALKPHOS, BILITOT, PROT, ALBUMIN in the last 168 hours. Coagulation Profile: No results for input(s): INR, PROTIME in the last 168  hours. HbA1C: Recent Labs    02/14/21 0659  HGBA1C 10.9*   CBG: Recent Labs  Lab 02/14/21 1311 02/14/21 1650 02/14/21 1747 02/14/21 2115 02/15/21 0608  GLUCAP 202* 377* 383* 414* 170*    Recent Results (from the past 240 hour(s))  Resp Panel by RT-PCR (Flu A&B, Covid) Nasopharyngeal Swab     Status: Abnormal   Collection Time: 02/14/21 12:49 AM   Specimen: Nasopharyngeal Swab; Nasopharyngeal(NP) swabs in vial transport medium  Result Value Ref Range Status   SARS Coronavirus 2 by RT PCR NEGATIVE NEGATIVE Final    Comment: (NOTE) SARS-CoV-2 target nucleic acids are NOT DETECTED.  The  SARS-CoV-2 RNA is generally detectable in upper respiratory specimens during the acute phase of infection. The lowest concentration of SARS-CoV-2 viral copies this assay can detect is 138 copies/mL. A negative result does not preclude SARS-Cov-2 infection and should not be used as the sole basis for treatment or other patient management decisions. A negative result may occur with  improper specimen collection/handling, submission of specimen other than nasopharyngeal swab, presence of viral mutation(s) within the areas targeted by this assay, and inadequate number of viral copies(<138 copies/mL). A negative result must be combined with clinical observations, patient history, and epidemiological information. The expected result is Negative.  Fact Sheet for Patients:  EntrepreneurPulse.com.au  Fact Sheet for Healthcare Providers:  IncredibleEmployment.be  This test is no t yet approved or cleared by the [place] FDA and  has been authorized for detection and/or diagnosis of SARS-CoV-2 by FDA under an Emergency Use Authorization (EUA). This EUA will remain  in effect (meaning this test can be used) for the duration of the COVID-19 declaration under Section 564(b)(1) of the Act, 21 U.S.C.section 360bbb-3(b)(1), unless the authorization is terminated   or revoked sooner.       Influenza A by PCR POSITIVE (A) NEGATIVE Final   Influenza B by PCR NEGATIVE NEGATIVE Final    Comment: (NOTE) The Xpert Xpress SARS-CoV-2/FLU/RSV plus assay is intended as an aid in the diagnosis of influenza from Nasopharyngeal swab specimens and should not be used as a sole basis for treatment. Nasal washings and aspirates are unacceptable for Xpert Xpress SARS-CoV-2/FLU/RSV testing.  Fact Sheet for Patients: EntrepreneurPulse.com.au  Fact Sheet for Healthcare Providers: IncredibleEmployment.be  This test is not yet approved or cleared by the [place] FDA and has been authorized for detection and/or diagnosis of SARS-CoV-2 by FDA under an Emergency Use Authorization (EUA). This EUA will remain in effect (meaning this test can be used) for the duration of the COVID-19 declaration under Section 564(b)(1) of the Act, 21 U.S.C. section 360bbb-3(b)(1), unless the authorization is terminated or revoked.  Performed at [facility], [place] [address] [place], [place] [address]      Radiology Studies: No results found.   Marzetta Board, MD, PhD [facility]  Between 7 am - 7 pm I am available, please contact me via Amion (for emergencies) or Securechat (non urgent messages)  Between 7 pm - 7 am I am not available, please contact night coverage MD/APP via Amion

## 2021-02-16 LAB — CBC
HCT: 42.3 % (ref 36.0–46.0)
Hemoglobin: 13.7 g/dL (ref 12.0–15.0)
MCH: 30 pg (ref 26.0–34.0)
MCHC: 32.4 g/dL (ref 30.0–36.0)
MCV: 92.6 fL (ref 80.0–100.0)
Platelets: 259 10*3/uL (ref 150–400)
RBC: 4.57 MIL/uL (ref 3.87–5.11)
RDW: 14.5 % (ref 11.5–15.5)
WBC: 16.1 10*3/uL — ABNORMAL HIGH (ref 4.0–10.5)
nRBC: 0 % (ref 0.0–0.2)

## 2021-02-16 LAB — BASIC METABOLIC PANEL
Anion gap: 8 (ref 5–15)
BUN: 28 mg/dL — ABNORMAL HIGH (ref 8–23)
CO2: 32 mmol/L (ref 22–32)
Calcium: 9.5 mg/dL (ref 8.9–10.3)
Chloride: 93 mmol/L — ABNORMAL LOW (ref 98–111)
Creatinine, Ser: 1.24 mg/dL — ABNORMAL HIGH (ref 0.44–1.00)
GFR, Estimated: 48 mL/min — ABNORMAL LOW (ref >60)
Glucose, Bld: 253 mg/dL — ABNORMAL HIGH (ref 70–99)
Potassium: 4.4 mmol/L (ref 3.5–5.1)
Sodium: 133 mmol/L — ABNORMAL LOW (ref 135–145)

## 2021-02-16 LAB — GLUCOSE, CAPILLARY
Glucose-Capillary: 177 mg/dL — ABNORMAL HIGH (ref 70–99)
Glucose-Capillary: 333 mg/dL — ABNORMAL HIGH (ref 70–99)
Glucose-Capillary: 368 mg/dL — ABNORMAL HIGH (ref 70–99)
Glucose-Capillary: 230 mg/dL — ABNORMAL HIGH (ref 70–99)

## 2021-02-16 MED ORDER — BENZONATATE 100 MG PO CAPS
100.0000 mg | ORAL_CAPSULE | Freq: Once | ORAL | Status: AC
  Administered 2021-02-16: 100 mg via ORAL
  Filled 2021-02-16: qty 1

## 2021-02-16 MED ORDER — INSULIN ASPART 100 UNIT/ML IJ SOLN
7.0000 [IU] | Freq: Three times a day (TID) | INTRAMUSCULAR | Status: DC
  Administered 2021-02-16 – 2021-02-18 (×7): 7 [IU] via SUBCUTANEOUS

## 2021-02-16 NOTE — Plan of Care (Signed)

## 2021-02-16 NOTE — TOC Initial Note (Addendum)
Transition of Care [facility]) - Initial/Assessment Note    Patient Details  Name: Kathleen Hatfield MRN: [id number] Date of Birth: [birth year]  Transition of Care [facility]) CM/SW Contact:    Carles Collet, RN Phone Number: 02/16/2021, 12:44 PM  Clinical Narrative:         Damaris Schooner w patient over the phone to discuss DC plan. She states her plans will be to return home, agreeable to [facility] services at DC. No preference for provider. Referral placed to [facility]  Patient states that she has CPAP at home, rollator and RW. Anticipate home oxygen needs. Requested RN to obtain ambulatory sats today in anticipation of possible DC in next 24-48 hours.  Will need amb sats, O2 order, HH order.            Expected Discharge Plan: [place] Barriers to Discharge: Continued Medical Work up   Patient Goals and CMS Choice Patient states their goals for this hospitalization and ongoing recovery are:: to go home CMS Medicare.gov Compare Post Acute Care list provided to:: Patient Choice offered to / list presented to : Patient  Expected Discharge Plan and Services Expected Discharge Plan: [place]   Discharge Planning Services: CM Consult Post Acute Care Choice: Home Health, Durable Medical Equipment Living arrangements for the past 2 months: Single Family Home                           HH Arranged: PT, OT HH Agency: [place] Date [facility] Agency Contacted: 02/16/21 Time HH Agency Contacted: 1244 Representative spoke with at [place] Arrangements/Services Living arrangements for the past 2 months: [place]                Current home services: DME    Activities of Daily Living Home Assistive Devices/Equipment: Environmental consultant (specify type) (front wheel and rollator) ADL Screening (condition at time of admission) Patient's cognitive ability adequate to safely complete daily activities?: Yes Is the patient deaf or have difficulty  hearing?: No Does the patient have difficulty seeing, even when wearing glasses/contacts?: No Does the patient have difficulty concentrating, remembering, or making decisions?: No Patient able to express need for assistance with ADLs?: Yes Does the patient have difficulty dressing or bathing?: Yes Independently performs ADLs?: Yes (appropriate for developmental age) Does the patient have difficulty walking or climbing stairs?: Yes Weakness of Legs: None Weakness of Arms/Hands: None  Permission Sought/Granted                  Emotional Assessment              Admission diagnosis:  Hyperkalemia [E87.5] Influenza A [J10.1] Hyperglycemia [R73.9] COPD exacerbation ([place]) [J44.1] Acute on chronic renal insufficiency [N28.9, N18.9] Patient Active Problem List   Diagnosis Date Noted   Hyperglycemia 02/14/2021   Rash 09/27/2020   COPD exacerbation ([place]) 09/21/2020   Acute exacerbation of COPD with asthma ([place]) 09/19/2020   Mixed diabetic hyperlipidemia associated with type 2 diabetes mellitus ([place]) 09/19/2020   Pain of foot 06/20/2020   Medicare annual wellness visit, initial 06/20/2020   Constipation 06/11/2020   Diverticulosis of colon 06/11/2020   Irritable bowel syndrome 14/97/0263   Periumbilical pain 78/58/8502   RLS (restless legs syndrome)    Acute asthma exacerbation 01/09/2020   Asthma exacerbation 01/08/2020   Muscle weakness 01/08/2020   Grade I diastolic dysfunction 77/41/2878   Statin myopathy 10/10/2019  Advance care planning 06/14/2019   RLQ abdominal pain 06/14/2019   COPD (chronic obstructive pulmonary disease) ([place]) 06/14/2019   Leukocytosis 06/14/2019   Hypercalcemia 06/14/2019   Gout 06/14/2019   Type 2 diabetes mellitus with hyperglycemia, without long-term current use of insulin ([place])    COPD with acute exacerbation ([place]) 05/03/2017   Acute renal failure superimposed on stage 2 chronic kidney disease ([place]) 04/02/2016   Diabetes mellitus without  complication ([place])    CKD (chronic kidney disease)    Hypersomnia with sleep apnea 09/16/2015   Fatigue 06/07/2015   Morbid obesity ([place]) 06/07/2015   Chronic diastolic heart failure ([place])    Pneumonia of both lower lobes due to infectious organism 02/09/2013   OSA on CPAP 08/23/2012   Essential hypertension    Depression    GERD without esophagitis    Hyperlipidemia    Ulcerative colitis ([place])    Patellar tendinitis 05/25/2011   Knee pain 05/25/2011   Myofascial pain 05/25/2011   Cervicalgia 05/25/2011   PCP:  [name], MD Pharmacy:   [facility]) [place], [place] [address] Phone: [phone] Fax: [phone]  [place], [address] [place] [address] Phone: [phone] Fax: [phone]  [id number] [address] [address] [place]: [phone]: [address] [address]Phone: [phone]: [phone] [id number] [address] [place] [place]one: [phone]: [id number] [place] [address] [place]: [phone]: [phone]   Social Determinants of Health (SDCliftonInterventions    Readmission Risk Interventions No flowsheet data found.

## 2021-02-16 NOTE — Progress Notes (Signed)
Patient SATS 92% on room air. While ambulating, Patient SATS drop to 90% on room air.

## 2021-02-16 NOTE — Progress Notes (Signed)
PROGRESS NOTE  Kathleen Hatfield HBZ:[id number] DOB: 04-20-1955 DOA: 02/14/2021 PCP: [name], MD   LOS: 1 day   Brief Narrative / Interim history: 65 year old female with COPD/asthma, chronic diastolic CHF, chronic kidney disease stage IIIa, depression, IDDM, hypertension, hyperlipidemia, RLS, OSA on CPAP, ulcerative colitis, comes into the hospital with shortness of breath.  Patient has been feeling sick for the past week, went to the ER last week, was given doxycycline as well as prednisone and was sent home.  She tells me that she continues to feel worse, more and more short of breath and decided come back to the hospital.  In the ED she was found to be hypoxic requiring supplemental oxygen, wheezing, and was also found to have significant hyperglycemia with sugars in the 700s without anion gap.  She tested positive for influenza A.  She was placed on insulin infusion, Tamiflu and admitted to the hospital.  Subjective / 24h Interval events: Finally feeling a little bit better this morning.  Still on oxygen, has not walked out of the room yet.  Assessment & Plan: Principal Problem Acute hypoxic respiratory failure due to acute COPD/asthma exacerbation due to influenza A-she remains hypoxic today.  I suspect a degree of chronic hypoxemic respiratory failure at home given reported shortness of breath with exertion for quite some time.  We will do ambulatory sats today, suspect she will need home oxygen  Active Problems Insulin-dependent type 2 diabetes mellitus, hyperglycemic state-continue current long-acting, increase mealtime coverage.  CBG (last 3)  Recent Labs    02/15/21 1601 02/15/21 2104 02/16/21 0613  GLUCAP 323* 342* 177*     Chronic kidney disease stage IIIa-Baseline creatinine 1.0-1.4, close to baseline at 1.6 on admission.  Currently at baseline   Leukocytosis-from steroids /viral infection   Hyperkalemia-resolved, potassium 4.4   Chronic diastolic CHF-euvolemic    Hypertension-continue home medications  Diabetic neuropathy -continue gabapentin   RLS-continue Requip  Scheduled Meds:  arformoterol  15 mcg Nebulization BID   budesonide (PULMICORT) nebulizer solution  0.25 mg Nebulization BID   buPROPion  300 mg Oral Daily   dextromethorphan-guaiFENesin  1 tablet Oral BID   DULoxetine  60 mg Oral QHS   enoxaparin (LOVENOX) injection  40 mg Subcutaneous Q24H   gabapentin  300 mg Oral QHS   guaiFENesin  600 mg Oral Once   insulin aspart  0-20 Units Subcutaneous TID WC   insulin aspart  0-5 Units Subcutaneous QHS   insulin aspart  4 Units Subcutaneous TID WC   insulin glargine-yfgn  40 Units Subcutaneous BID   ipratropium-albuterol  3 mL Nebulization Q6H   oseltamivir  30 mg Oral BID   pantoprazole  40 mg Oral Daily   [START ON 02/17/2021] predniSONE  20 mg Oral Q breakfast   Followed by   Derrill Memo ON 02/19/2021] predniSONE  10 mg Oral Q breakfast   propranolol  10 mg Oral BID   rOPINIRole  4 mg Oral BID   Continuous Infusions: PRN Meds:.acetaminophen **OR** acetaminophen, albuterol, dextrose, guaiFENesin-dextromethorphan  Diet Orders (From admission, onward)     Start     Ordered   02/14/21 1355  Diet Heart Room service appropriate? Yes; Fluid consistency: Thin  Diet effective now       Question Answer Comment  Room service appropriate? Yes   Fluid consistency: Thin      02/14/21 1354            DVT prophylaxis: enoxaparin (LOVENOX) injection 40 mg Start: 02/14/21 1600  Code Status: Full Code  Family Communication: No family at bedside  Status is: Inpatient, remains hypoxic and symptomatic.  Anticipate DC 24-48 hours   Level of care: Telemetry Medical  Consultants:  none  Procedures:  none  Microbiology  Flu A  Antimicrobials: Tamiflu    Objective: Vitals:   02/15/21 2001 02/15/21 2013 02/16/21 0442 02/16/21 0737  BP: 133/74     Pulse:      Resp:      Temp: 98.1 F (36.7 C)  98.2 F (36.8 C)    TempSrc: Oral  Oral   SpO2:  94%  97%  Weight:   119.5 kg   Height:        Intake/Output Summary (Last 24 hours) at 02/16/2021 0945 Last data filed at 02/16/2021 0831 Gross per 24 hour  Intake 720 ml  Output 700 ml  Net 20 ml    Filed Weights   02/14/21 1712 02/15/21 0431 02/16/21 0442  Weight: 118.7 kg 118.7 kg 119.5 kg    Examination:  Constitutional: No distress, at the edge of the bed Eyes: Sclerae anicteric ENMT: mmm  Neck: normal, supple Respiratory: Minimal end expiratory wheezing, moves air well, no crackles or rhonchi Cardiovascular: Regular, no murmurs, trace edema Abdomen: Nondistended, Musculoskeletal: no clubbing / cyanosis.  Skin: no rashes Neurologic: Grossly nonfocal  Data Reviewed: I have independently reviewed following labs and imaging studies   CBC: Recent Labs  Lab 02/14/21 0109 02/14/21 0501 02/14/21 0659 02/16/21 0040  WBC 11.8*  --  11.9* 16.1*  NEUTROABS 8.4*  --   --   --   HGB 15.0 16.7* 14.7 13.7  HCT 46.9* 49.0* 44.4 42.3  MCV 91.6  --  89.9 92.6  PLT 311  --  313 371    Basic Metabolic Panel: Recent Labs  Lab 02/14/21 0109 02/14/21 0501 02/14/21 0659 02/14/21 1010 02/14/21 2140 02/16/21 0040  NA 122* 125* 131* 132*  --  133*  K 5.6* 6.6* 4.6 4.4  --  4.4  CL 83*  --  91* 93*  --  93*  CO2 26  --  24 27  --  32  GLUCOSE 703*  --  334* 231* 408* 253*  BUN 35*  --  34* 30*  --  28*  CREATININE 1.67*  --  1.43* 1.27*  --  1.24*  CALCIUM 9.3  --  9.4 9.7  --  9.5    Liver Function Tests: No results for input(s): AST, ALT, ALKPHOS, BILITOT, PROT, ALBUMIN in the last 168 hours. Coagulation Profile: No results for input(s): INR, PROTIME in the last 168 hours. HbA1C: Recent Labs    02/14/21 0659  HGBA1C 10.9*    CBG: Recent Labs  Lab 02/15/21 0608 02/15/21 1111 02/15/21 1601 02/15/21 2104 02/16/21 0613  GLUCAP 170* 379* 323* 342* 177*     Recent Results (from the past 240 hour(s))  Resp Panel by RT-PCR  (Flu A&B, Covid) Nasopharyngeal Swab     Status: Abnormal   Collection Time: 02/14/21 12:49 AM   Specimen: Nasopharyngeal Swab; Nasopharyngeal(NP) swabs in vial transport medium  Result Value Ref Range Status   SARS Coronavirus 2 by RT PCR NEGATIVE NEGATIVE Final    Comment: (NOTE) SARS-CoV-2 target nucleic acids are NOT DETECTED.  The SARS-CoV-2 RNA is generally detectable in upper respiratory specimens during the acute phase of infection. The lowest concentration of SARS-CoV-2 viral copies this assay can detect is 138 copies/mL. A negative result does not preclude SARS-Cov-2 infection and should  not be used as the sole basis for treatment or other patient management decisions. A negative result may occur with  improper specimen collection/handling, submission of specimen other than nasopharyngeal swab, presence of viral mutation(s) within the areas targeted by this assay, and inadequate number of viral copies(<138 copies/mL). A negative result must be combined with clinical observations, patient history, and epidemiological information. The expected result is Negative.  Fact Sheet for Patients:  EntrepreneurPulse.com.au  Fact Sheet for Healthcare Providers:  IncredibleEmployment.be  This test is no t yet approved or cleared by the [place] FDA and  has been authorized for detection and/or diagnosis of SARS-CoV-2 by FDA under an Emergency Use Authorization (EUA). This EUA will remain  in effect (meaning this test can be used) for the duration of the COVID-19 declaration under Section 564(b)(1) of the Act, 21 U.S.C.section 360bbb-3(b)(1), unless the authorization is terminated  or revoked sooner.       Influenza A by PCR POSITIVE (A) NEGATIVE Final   Influenza B by PCR NEGATIVE NEGATIVE Final    Comment: (NOTE) The Xpert Xpress SARS-CoV-2/FLU/RSV plus assay is intended as an aid in the diagnosis of influenza from Nasopharyngeal swab  specimens and should not be used as a sole basis for treatment. Nasal washings and aspirates are unacceptable for Xpert Xpress SARS-CoV-2/FLU/RSV testing.  Fact Sheet for Patients: EntrepreneurPulse.com.au  Fact Sheet for Healthcare Providers: IncredibleEmployment.be  This test is not yet approved or cleared by the [place] FDA and has been authorized for detection and/or diagnosis of SARS-CoV-2 by FDA under an Emergency Use Authorization (EUA). This EUA will remain in effect (meaning this test can be used) for the duration of the COVID-19 declaration under Section 564(b)(1) of the Act, 21 U.S.C. section 360bbb-3(b)(1), unless the authorization is terminated or revoked.  Performed at [facility], [place] [address] [place], [place] [address]       Radiology Studies: No results found.   Marzetta Board, MD, PhD [facility]  Between 7 am - 7 pm I am available, please contact me via Amion (for emergencies) or Securechat (non urgent messages)  Between 7 pm - 7 am I am not available, please contact night coverage MD/APP via Amion

## 2021-02-16 NOTE — Plan of Care (Signed)
  Problem: Health Behavior/Discharge Planning: Goal: Ability to manage health-related needs will improve Outcome: Progressing   Problem: Clinical Measurements: Goal: Ability to maintain clinical measurements within normal limits will improve Outcome: Progressing   Problem: Clinical Measurements: Goal: Respiratory complications will improve Outcome: Progressing   

## 2021-02-17 ENCOUNTER — Inpatient Hospital Stay: Payer: Medicare HMO | Admitting: Family Medicine

## 2021-02-17 LAB — URINALYSIS, ROUTINE W REFLEX MICROSCOPIC
Bilirubin Urine: NEGATIVE
Glucose, UA: >=500 mg/dL — AB
Hgb urine dipstick: NEGATIVE
Ketones, ur: NEGATIVE mg/dL
Nitrite: NEGATIVE
Protein, ur: NEGATIVE mg/dL
Specific Gravity, Urine: 1.015 (ref 1.005–1.030)
pH: 5.5 (ref 5.0–8.0)

## 2021-02-17 LAB — GLUCOSE, CAPILLARY
Glucose-Capillary: 169 mg/dL — ABNORMAL HIGH (ref 70–99)
Glucose-Capillary: 220 mg/dL — ABNORMAL HIGH (ref 70–99)
Glucose-Capillary: 343 mg/dL — ABNORMAL HIGH (ref 70–99)
Glucose-Capillary: 279 mg/dL — ABNORMAL HIGH (ref 70–99)

## 2021-02-17 LAB — URINALYSIS, MICROSCOPIC (REFLEX)

## 2021-02-17 MED ORDER — BENZONATATE 100 MG PO CAPS
100.0000 mg | ORAL_CAPSULE | Freq: Once | ORAL | Status: AC
  Administered 2021-02-17: 100 mg via ORAL
  Filled 2021-02-17: qty 1

## 2021-02-17 MED ORDER — FUROSEMIDE 40 MG PO TABS
60.0000 mg | ORAL_TABLET | Freq: Two times a day (BID) | ORAL | Status: DC
  Administered 2021-02-17 – 2021-02-18 (×3): 60 mg via ORAL
  Filled 2021-02-17 (×3): qty 1

## 2021-02-17 NOTE — Progress Notes (Signed)
PROGRESS NOTE  Kathleen Hatfield JGO:[id number] DOB: 1956-02-24 DOA: 02/14/2021 PCP: [name], MD   LOS: 2 days   Brief Narrative / Interim history: 65 year old female with COPD/asthma, chronic diastolic CHF, chronic kidney disease stage IIIa, depression, IDDM, hypertension, hyperlipidemia, RLS, OSA on CPAP, ulcerative colitis, comes into the hospital with shortness of breath.  Patient has been feeling sick for the past week, went to the ER last week, was given doxycycline as well as prednisone and was sent home.  She tells me that she continues to feel worse, more and more short of breath and decided come back to the hospital.  In the ED she was found to be hypoxic requiring supplemental oxygen, wheezing, and was also found to have significant hyperglycemia with sugars in the 700s without anion gap.  She tested positive for influenza A.  She was placed on insulin infusion, Tamiflu and admitted to the hospital.  Subjective / 24h Interval events: Still coughing, overall improving.  [place]albin the hallway yesterday, got very short of breath  Assessment & Plan: Principal Problem Acute hypoxic respiratory failure due to acute COPD/asthma exacerbation due to influenza A-she remains hypoxic today.  I suspect a degree of chronic hypoxemic respiratory failure at home given reported shortness of breath with exertion for quite some time.  Today walked with therapy and desatted, will need oxygen at home.  If she continues to improve at the same rate anticipate home tomorrow  Active Problems Insulin-dependent type 2 diabetes mellitus, hyperglycemic state-continue regimen as below  CBG (last 3)  Recent Labs    02/16/21 1551 02/16/21 2059 02/17/21 0622  GLUCAP 368* 230* 169*     Chronic kidney disease stage IIIa-Baseline creatinine 1.0-1.4, currently at baseline   Leukocytosis-from steroids /viral infection   Hyperkalemia-resolved, potassium 4.4   Chronic diastolic CHF-euvolemic, resume home  Lasix   Hypertension-continue home medications  Diabetic neuropathy -continue gabapentin   RLS-continue Requip  Scheduled Meds:  arformoterol  15 mcg Nebulization BID   budesonide (PULMICORT) nebulizer solution  0.25 mg Nebulization BID   buPROPion  300 mg Oral Daily   dextromethorphan-guaiFENesin  1 tablet Oral BID   DULoxetine  60 mg Oral QHS   enoxaparin (LOVENOX) injection  40 mg Subcutaneous Q24H   furosemide  60 mg Oral BID   gabapentin  300 mg Oral QHS   guaiFENesin  600 mg Oral Once   insulin aspart  0-20 Units Subcutaneous TID WC   insulin aspart  0-5 Units Subcutaneous QHS   insulin aspart  7 Units Subcutaneous TID WC   insulin glargine-yfgn  40 Units Subcutaneous BID   ipratropium-albuterol  3 mL Nebulization Q6H   oseltamivir  30 mg Oral BID   pantoprazole  40 mg Oral Daily   predniSONE  20 mg Oral Q breakfast   Followed by   Derrill Memo ON 02/19/2021] predniSONE  10 mg Oral Q breakfast   propranolol  10 mg Oral BID   rOPINIRole  4 mg Oral BID   Continuous Infusions: PRN Meds:.acetaminophen **OR** acetaminophen, albuterol, dextrose, guaiFENesin-dextromethorphan  Diet Orders (From admission, onward)     Start     Ordered   02/14/21 1355  Diet Heart Room service appropriate? Yes; Fluid consistency: Thin  Diet effective now       Question Answer Comment  Room service appropriate? Yes   Fluid consistency: Thin      02/14/21 1354            DVT prophylaxis: enoxaparin (LOVENOX) injection 40  mg Start: 02/14/21 1600     Code Status: Full Code  Family Communication: No family at bedside  Status is: Inpatient, remains hypoxic and symptomatic.  Anticipate DC 24 hours   Level of care: Telemetry Medical  Consultants:  none  Procedures:  none  Microbiology  Flu A  Antimicrobials: Tamiflu    Objective: Vitals:   02/17/21 0444 02/17/21 0800 02/17/21 0801 02/17/21 1009  BP:      Pulse:      Resp:      Temp:      TempSrc:      SpO2:  98% 98%  93%  Weight: 119.3 kg     Height:        Intake/Output Summary (Last 24 hours) at 02/17/2021 1028 Last data filed at 02/17/2021 0446 Gross per 24 hour  Intake 360 ml  Output 575 ml  Net -215 ml    Filed Weights   02/15/21 0431 02/16/21 0442 02/17/21 0444  Weight: 118.7 kg 119.5 kg 119.3 kg    Examination:  Constitutional: NAD Eyes: No icterus ENMT: Moist mucous membranes Neck: normal, supple Respiratory: Faint end expiratory wheezing, no crackles, no rhonchi Cardiovascular: Regular rate and rhythm, no murmurs, trace edema Abdomen: Soft, NT, ND, bowel sounds positive, Musculoskeletal: no clubbing / cyanosis.  Skin: No rashes seen Neurologic: No focal deficits  Data Reviewed: I have independently reviewed following labs and imaging studies   CBC: Recent Labs  Lab 02/14/21 0109 02/14/21 0501 02/14/21 0659 02/16/21 0040  WBC 11.8*  --  11.9* 16.1*  NEUTROABS 8.4*  --   --   --   HGB 15.0 16.7* 14.7 13.7  HCT 46.9* 49.0* 44.4 42.3  MCV 91.6  --  89.9 92.6  PLT 311  --  313 585    Basic Metabolic Panel: Recent Labs  Lab 02/14/21 0109 02/14/21 0501 02/14/21 0659 02/14/21 1010 02/14/21 2140 02/16/21 0040  NA 122* 125* 131* 132*  --  133*  K 5.6* 6.6* 4.6 4.4  --  4.4  CL 83*  --  91* 93*  --  93*  CO2 26  --  24 27  --  32  GLUCOSE 703*  --  334* 231* 408* 253*  BUN 35*  --  34* 30*  --  28*  CREATININE 1.67*  --  1.43* 1.27*  --  1.24*  CALCIUM 9.3  --  9.4 9.7  --  9.5    Liver Function Tests: No results for input(s): AST, ALT, ALKPHOS, BILITOT, PROT, ALBUMIN in the last 168 hours. Coagulation Profile: No results for input(s): INR, PROTIME in the last 168 hours. HbA1C: No results for input(s): HGBA1C in the last 72 hours.  CBG: Recent Labs  Lab 02/16/21 0613 02/16/21 1203 02/16/21 1551 02/16/21 2059 02/17/21 0622  GLUCAP 177* 333* 368* 230* 169*     Recent Results (from the past 240 hour(s))  Resp Panel by RT-PCR (Flu A&B, Covid)  Nasopharyngeal Swab     Status: Abnormal   Collection Time: 02/14/21 12:49 AM   Specimen: Nasopharyngeal Swab; Nasopharyngeal(NP) swabs in vial transport medium  Result Value Ref Range Status   SARS Coronavirus 2 by RT PCR NEGATIVE NEGATIVE Final    Comment: (NOTE) SARS-CoV-2 target nucleic acids are NOT DETECTED.  The SARS-CoV-2 RNA is generally detectable in upper respiratory specimens during the acute phase of infection. The lowest concentration of SARS-CoV-2 viral copies this assay can detect is 138 copies/mL. A negative result does not preclude SARS-Cov-2 infection and  should not be used as the sole basis for treatment or other patient management decisions. A negative result may occur with  improper specimen collection/handling, submission of specimen other than nasopharyngeal swab, presence of viral mutation(s) within the areas targeted by this assay, and inadequate number of viral copies(<138 copies/mL). A negative result must be combined with clinical observations, patient history, and epidemiological information. The expected result is Negative.  Fact Sheet for Patients:  EntrepreneurPulse.com.au  Fact Sheet for Healthcare Providers:  IncredibleEmployment.be  This test is no t yet approved or cleared by the [place] FDA and  has been authorized for detection and/or diagnosis of SARS-CoV-2 by FDA under an Emergency Use Authorization (EUA). This EUA will remain  in effect (meaning this test can be used) for the duration of the COVID-19 declaration under Section 564(b)(1) of the Act, 21 U.S.C.section 360bbb-3(b)(1), unless the authorization is terminated  or revoked sooner.       Influenza A by PCR POSITIVE (A) NEGATIVE Final   Influenza B by PCR NEGATIVE NEGATIVE Final    Comment: (NOTE) The Xpert Xpress SARS-CoV-2/FLU/RSV plus assay is intended as an aid in the diagnosis of influenza from Nasopharyngeal swab specimens  and should not be used as a sole basis for treatment. Nasal washings and aspirates are unacceptable for Xpert Xpress SARS-CoV-2/FLU/RSV testing.  Fact Sheet for Patients: EntrepreneurPulse.com.au  Fact Sheet for Healthcare Providers: IncredibleEmployment.be  This test is not yet approved or cleared by the [place] FDA and has been authorized for detection and/or diagnosis of SARS-CoV-2 by FDA under an Emergency Use Authorization (EUA). This EUA will remain in effect (meaning this test can be used) for the duration of the COVID-19 declaration under Section 564(b)(1) of the Act, 21 U.S.C. section 360bbb-3(b)(1), unless the authorization is terminated or revoked.  Performed at [facility], [place] [address], [place], [place] [address]       Radiology Studies: No results found.   Marzetta Board, MD, PhD [facility]  Between 7 am - 7 pm I am available, please contact me via Amion (for emergencies) or Securechat (non urgent messages)  Between 7 pm - 7 am I am not available, please contact night coverage MD/APP via Amion

## 2021-02-17 NOTE — Progress Notes (Signed)
Occupational Therapy Treatment Patient Details Name: Kathleen Hatfield MRN: [id number] DOB: 23-Dec-1955 Today's Date: 02/17/2021   History of present illness This 65 y.o. female presented to ED with SOB and cough and decreased 02 sats - in the 70s.  Dx: acute hypoxic respiratory failure secondary to acute exacerbation of COPD/asthma likely precipitated by Influenza A.  She was also found to have severe hyperglycemia (703), likely due to steriod use.  PMH includes:  CKD stage III, chronic diastolic CHF, COPD with asthma, depression, insulin-dependent type 2 diabetes, GERD, hyperlipidemia, hypertension, RLS, OSA on CPAP, ulcerative colitis, peripheral neuropathy.   OT comments  Pt making progress with functional goals. Pt sitting EON upon arrival. Session focused on sit - stand for ambulation to sink and toilet transfers, LB ADLs/selfcare safety and Ind education review using A/E (pt had A/E at home, but boyfriend assists her prn). OT will continue to follow acutely to maximize level of function and safety   Recommendations for follow up therapy are one component of a multi-disciplinary discharge planning process, led by the attending physician.  Recommendations may be updated based on patient status, additional functional criteria and insurance authorization.    Follow Up Recommendations  Home health OT    Assistance Recommended at Discharge Frequent or constant Supervision/Assistance  Equipment Recommendations  None recommended by OT    Recommendations for Other Services      Precautions / Restrictions Precautions Precautions: Fall Restrictions Weight Bearing Restrictions: No       Mobility Bed Mobility               General bed mobility comments: pt sitting EOB upon arrival    Transfers Overall transfer level: Modified independent   Transfers: Sit to/from Stand Sit to Stand: Modified independent (Device/Increase time)           General transfer comment: no physical  assist needed     Balance Overall balance assessment: Needs assistance Sitting-balance support: Feet supported Sitting balance-Leahy Scale: Good     Standing balance support: Bilateral upper extremity supported;During functional activity Standing balance-Leahy Scale: Poor                             ADL either performed or assessed with clinical judgement   ADL Overall ADL's : Needs assistance/impaired     Grooming: Wash/dry hands;Wash/dry face;Supervision/safety;Standing           Upper Body Dressing : Supervision/safety;Sitting       Toilet Transfer: Supervision/safety;Modified Independent   Toileting- Clothing Manipulation and Hygiene: Min guard;Supervision/safety;Sit to/from stand       Functional mobility during ADLs: Supervision/safety;Modified independent      Extremity/Trunk Assessment Upper Extremity Assessment Upper Extremity Assessment: Generalized weakness   Lower Extremity Assessment Lower Extremity Assessment: Defer to PT evaluation   Cervical / Trunk Assessment Cervical / Trunk Assessment: Normal    Vision Baseline Vision/History: 1 Wears glasses Ability to See in Adequate Light: 0 Adequate Patient Visual Report: No change from baseline     Perception     Praxis      Cognition Arousal/Alertness: Awake/alert Behavior During Therapy: WFL for tasks assessed/performed Overall Cognitive Status: Within Functional Limits for tasks assessed                                            Exercises  Shoulder Instructions       General Comments      Pertinent Vitals/ Pain       Pain Assessment: No/denies pain Faces Pain Scale: No hurt  Home Living                                          Prior Functioning/Environment              Frequency  Min 2X/week        Progress Toward Goals  OT Goals(current goals can now be found in the care plan section)  Progress towards OT goals:  Progressing toward goals     Plan      Co-evaluation                 AM-PAC OT "6 Clicks" Daily Activity     Outcome Measure   Help from another person eating meals?: None Help from another person taking care of personal grooming?: None Help from another person toileting, which includes using toliet, bedpan, or urinal?: A Little Help from another person bathing (including washing, rinsing, drying)?: A Little Help from another person to put on and taking off regular upper body clothing?: A Little Help from another person to put on and taking off regular lower body clothing?: A Little 6 Click Score: 20    End of Session Equipment Utilized During Treatment: Gait belt;Rolling walker (2 wheels);Oxygen  OT Visit Diagnosis: Unsteadiness on feet (R26.81);Muscle weakness (generalized) (M62.81);Cognitive communication deficit (R41.841)   Activity Tolerance Patient tolerated treatment well   Patient Left in bed;with call bell/phone within reach;Other (comment) (sitting EOB)   Nurse Communication          Time: 0737-1062 OT Time Calculation (min): 18 min  Charges: OT General Charges $OT Visit: 1 Visit OT Treatments $Self Care/Home Management : 8-22 mins    Britt Bottom 02/17/2021, 3:45 PM

## 2021-02-17 NOTE — Consult Note (Signed)
[facility] Inpatient Consult   02/17/2021  TINEKA URIEGAS August 08, 1955 [id number]  [place] [facility] CM)   Patient chart reviewed with noted high unplanned readmission risk score. Assessed for post hospital chronic disease management and care coordination services. Per review, patient is being followed by chronic care management team at primary provider office.   Plan: Continue to follow for progression and disposition.  Of note, [facility] Care Management services does not replace or interfere with any services that are arranged by inpatient case management or social work.   Netta Cedars, MSN, RN [facility] [employer] [phone]  Toll free office [phone]

## 2021-02-17 NOTE — TOC Progression Note (Signed)
Transition of Care [facility]) - Progression Note    Patient Details  Name: Kathleen Hatfield MRN: [id number] Date of Birth: February 13, 1956  [facility] CM/SW Contact  Zenon Mayo, RN Phone Number: 02/17/2021, 3:17 PM  Clinical Narrative:    NCM spoke with patient at bedside, asked if she had preference of where to get her oxygen from she states [facility] because she has all her DME from them. NCM contacted [facility], made referral for home oxygen. They will try to deliver today.   Expected Discharge Plan: [place] Barriers to Discharge: Continued Medical Work up  Expected Discharge Plan and Services Expected Discharge Plan: [place]   Discharge Planning Services: CM Consult Post Acute Care Choice: Home Health, Durable Medical Equipment Living arrangements for the past 2 months: Single Family Home                           HH Arranged: PT, OT HH Agency: [place] Date [place]: 02/16/21 Time [place]: 1244 Representative spoke with at [place]: [place] ([place]) Interventions    Readmission Risk Interventions No flowsheet data found.

## 2021-02-17 NOTE — Plan of Care (Signed)
  Problem: Clinical Measurements: Goal: Ability to maintain clinical measurements within normal limits will improve Outcome: Progressing   Problem: Clinical Measurements: Goal: Respiratory complications will improve Outcome: Progressing   Problem: Activity: Goal: Risk for activity intolerance will decrease Outcome: Progressing

## 2021-02-17 NOTE — Therapy (Signed)
Pt desaturates on room air with mobility and requires supplemental oxygen to maintain a safe saturation level.   O2 saturation on room air ambulating: 87% O2 saturation on 2 liters ambulating: 91-93%  Jimi Schappert, PT, GCS 02/17/21,10:18 AM

## 2021-02-17 NOTE — Progress Notes (Signed)
Physical Therapy Treatment Patient Details Name: Kathleen Hatfield MRN: [id number] DOB: [birth year] Today's Date: 02/17/2021   History of Present Illness This 65 y.o. female presented to ED with SOB and cough and decreased 02 sats - in the 70s.  Dx: acute hypoxic respiratory failure secondary to acute exacerbation of COPD/asthma likely precipitated by Influenza A.  She was also found to have severe hyperglycemia (703), likely due to steriod use.  PMH includes:  CKD stage III, chronic diastolic CHF, COPD with asthma, depression, insulin-dependent type 2 diabetes, GERD, hyperlipidemia, hypertension, RLS, OSA on CPAP, ulcerative colitis, peripheral neuropathy.    PT Comments    Patient received sitting up on side of bed. She is agreeable to PT. Reports she did not sleep well. Patient is mod independent with sit to stand. Ambulated 200 feet with rollator and required 3 seated rest breaks during ambulation due to fatigue and sob. O2 saturations down to 87% with ambulation on room air, therefore rest of mobility performed on 2 liters O2 with saturations between 91-95%. Patient will continue to benefit from skilled PT while here to improve activity tolerance and safety with mobility.     Recommendations for follow up therapy are one component of a multi-disciplinary discharge planning process, led by the attending physician.  Recommendations may be updated based on patient status, additional functional criteria and insurance authorization.  Follow Up Recommendations  Home health PT     Assistance Recommended at Discharge Frequent or constant Supervision/Assistance  Equipment Recommendations  Rollator (4 wheels);Other (comment) (Oxygen)    Recommendations for Other Services       Precautions / Restrictions Precautions Precautions: Fall Precaution Comments: mod fall Restrictions Weight Bearing Restrictions: No     Mobility  Bed Mobility               General bed mobility comments: patient  received sitting up on side of bed and returned to sitting up at end of session.    Transfers Overall transfer level: Modified independent Equipment used: Rollator (4 wheels) Transfers: Sit to/from Stand Sit to Stand: Modified independent (Device/Increase time)           General transfer comment: no physical assist needed    Ambulation/Gait Ambulation/Gait assistance: Supervision Gait Distance (Feet): 200 Feet Assistive device: Rollator (4 wheels) Gait Pattern/deviations: Step-through pattern;Decreased step length - right;Decreased step length - left;Shuffle;Trunk flexed Gait velocity: Decreased     General Gait Details: Patient leaning on forearms on rollator. Required 3 seated rest breaks during ambulation due to fatigue and sob. Saturations down to 87% on room air with ambulation. She remained between 91-95% on 2 liters of O2.   Stairs             Wheelchair Mobility    Modified Rankin (Stroke Patients Only)       Balance Overall balance assessment: Needs assistance Sitting-balance support: Feet supported Sitting balance-Leahy Scale: Good     Standing balance support: Bilateral upper extremity supported;During functional activity;Reliant on assistive device for balance Standing balance-Leahy Scale: Poor Standing balance comment: Reliant on BUE support                            Cognition Arousal/Alertness: Awake/alert Behavior During Therapy: WFL for tasks assessed/performed Overall Cognitive Status: Within Functional Limits for tasks assessed  Exercises      General Comments        Pertinent Vitals/Pain Pain Assessment: No/denies pain    Home Living                          Prior Function            PT Goals (current goals can now be found in the care plan section) Acute Rehab PT Goals Patient Stated Goal: to feel better, breathe better PT Goal Formulation:  With patient Time For Goal Achievement: 02/28/21 Potential to Achieve Goals: Good Progress towards PT goals: Progressing toward goals    Frequency    Min 3X/week      PT Plan Equipment recommendations need to be updated    Co-evaluation              AM-PAC PT "6 Clicks" Mobility   Outcome Measure  Help needed turning from your back to your side while in a flat bed without using bedrails?: None Help needed moving from lying on your back to sitting on the side of a flat bed without using bedrails?: A Little Help needed moving to and from a bed to a chair (including a wheelchair)?: A Little Help needed standing up from a chair using your arms (e.g., wheelchair or bedside chair)?: A Little Help needed to walk in hospital room?: A Little Help needed climbing 3-5 steps with a railing? : A Little 6 Click Score: 19    End of Session Equipment Utilized During Treatment: Gait belt;Oxygen Activity Tolerance: Patient limited by fatigue Patient left: in bed;with call bell/phone within reach Nurse Communication: Mobility status;Other (comment) (O2 needs) PT Visit Diagnosis: Other abnormalities of gait and mobility (R26.89);Muscle weakness (generalized) (M62.81);Difficulty in walking, not elsewhere classified (R26.2)     Time: 3818-2993 PT Time Calculation (min) (ACUTE ONLY): 26 min  Charges:  $Gait Training: 23-37 mins                     Eithan Beagle, PT, GCS 02/17/21,10:16 AM

## 2021-02-17 NOTE — Progress Notes (Signed)
Nutrition Brief Note  RD consulted for assessment of nutrition status.  Spoke with pt at bedside. Pt reports eating well, less well the 3 days PTA due to her flu. She is currently having a good appetite and no complaints.  Pt has lost 2% (5 lbs) in 10 days, which is not necessarily significant for the time frame.  Wt Readings from Last 15 Encounters:  02/17/21 119.3 kg  02/07/21 121.6 kg  12/12/20 124.3 kg  11/28/20 122 kg  10/11/20 121.7 kg  09/26/20 123.4 kg  09/22/20 123.2 kg  06/18/20 117 kg  05/30/20 119.6 kg  05/20/20 118.3 kg  05/06/20 117 kg  02/06/20 119.7 kg  01/15/20 117.9 kg  01/08/20 118.8 kg  12/25/19 118.8 kg   Body mass index is 43.75 kg/m.  Pt requested a carbohydrate restriction her diet order. RD changed diet from Heart Healthy/Carb Modified at pt's request.  Current diet order is Heart Healthy, patient is consuming approximately 100% of meals at this time. Labs and medications reviewed.   No nutrition interventions warranted at this time. If nutrition issues arise, please consult RD.   Derrel Nip, RD, LDN (she/her/hers) Clinical Inpatient Dietitian RD Pager/After-Hours/Weekend Pager # in [place]

## 2021-02-18 DIAGNOSIS — N289 Disorder of kidney and ureter, unspecified: Secondary | ICD-10-CM | POA: Diagnosis not present

## 2021-02-18 DIAGNOSIS — N189 Chronic kidney disease, unspecified: Secondary | ICD-10-CM | POA: Diagnosis not present

## 2021-02-18 DIAGNOSIS — J449 Chronic obstructive pulmonary disease, unspecified: Secondary | ICD-10-CM | POA: Diagnosis not present

## 2021-02-18 DIAGNOSIS — R739 Hyperglycemia, unspecified: Secondary | ICD-10-CM | POA: Diagnosis not present

## 2021-02-18 LAB — GLUCOSE, CAPILLARY
Glucose-Capillary: 85 mg/dL (ref 70–99)
Glucose-Capillary: 188 mg/dL — ABNORMAL HIGH (ref 70–99)

## 2021-02-18 MED ORDER — CEPHALEXIN 500 MG PO CAPS: 500.0000 mg | ORAL_CAPSULE | Freq: Two times a day (BID) | ORAL | 0 refills | Status: AC

## 2021-02-18 MED ORDER — GUAIFENESIN-DM 100-10 MG/5ML PO SYRP: 5.0000 mL | ORAL_SOLUTION | ORAL | 0 refills | Status: DC | PRN

## 2021-02-18 MED ORDER — PREDNISONE 10 MG PO TABS: 10.0000 mg | ORAL_TABLET | Freq: Every day | ORAL | 0 refills | Status: AC

## 2021-02-18 NOTE — TOC Transition Note (Signed)
Transition of Care [facility]) - CM/SW Discharge Note   Patient Details  Name: Kathleen Hatfield MRN: [id number] Date of Birth: [birth year]  Transition of Care [facility]) CM/SW Contact:  Zenon Mayo, RN Phone Number: 02/18/2021, 3:30 PM   Clinical Narrative:    Patient is for dc home today, NCM notified  Levada Dy with [place].  [facility] delivered the home oxygen.    Final next level of care: [place] Barriers to Discharge: No Barriers Identified   Patient Goals and CMS Choice Patient states their goals for this hospitalization and ongoing recovery are:: home with [facility] CMS Medicare.gov Compare Post Acute Care list provided to:: Patient Choice offered to / list presented to : Patient  Discharge Placement                       Discharge Plan and Services   Discharge Planning Services: CM Consult Post Acute Care Choice: Home Health, Durable Medical Equipment                    HH Arranged: PT, OT [facility] Agency: [place] Date [place]: 02/16/21 Time [place]: 1244 Representative spoke with at [place]: [place]  Social Determinants of Health ([place]) Interventions     Readmission Risk Interventions No flowsheet data found.

## 2021-02-18 NOTE — Plan of Care (Signed)
°  Problem: Clinical Measurements: Goal: Ability to maintain clinical measurements within normal limits will improve Outcome: Progressing   Problem: Clinical Measurements: Goal: Respiratory complications will improve Outcome: Progressing   Problem: Nutrition: Goal: Adequate nutrition will be maintained Outcome: Progressing   Problem: Education: Goal: Knowledge of General Education information will improve Description: Including pain rating scale, medication(s)/side effects and non-pharmacologic comfort measures Outcome: Adequate for Discharge   Problem: Health Behavior/Discharge Planning: Goal: Ability to manage health-related needs will improve Outcome: Adequate for Discharge

## 2021-02-18 NOTE — Progress Notes (Signed)
Physical Therapy Treatment Patient Details Name: Kathleen Hatfield MRN: [id number] DOB: 1955/04/17 Today's Date: 02/18/2021   History of Present Illness This 65 y.o. female presented to ED with SOB and cough and decreased 02 sats - in the 70s.  Dx: acute hypoxic respiratory failure secondary to acute exacerbation of COPD/asthma likely precipitated by Influenza A.  She was also found to have severe hyperglycemia (703), likely due to steriod use.  PMH includes:  CKD stage III, chronic diastolic CHF, COPD with asthma, depression, insulin-dependent type 2 diabetes, GERD, hyperlipidemia, hypertension, RLS, OSA on CPAP, ulcerative colitis, peripheral neuropathy.    PT Comments    Patient preparing for d/c home today.  Declined mobility, but discussed her concerns for several areas in her home with 2 steps.  She has been using door handles.  Discussed using rollator locked on level ground for two steps up or down.  Also discussed getting grabbars installed on door facings and showed options on shopping site.  Patient remains appropriate for HHPT at d/c.    Recommendations for follow up therapy are one component of a multi-disciplinary discharge planning process, led by the attending physician.  Recommendations may be updated based on patient status, additional functional criteria and insurance authorization.  Follow Up Recommendations  Home health PT     Assistance Recommended at Discharge    Equipment Recommendations  Other (comment) (has rollator, needs home O2)    Recommendations for Other Services       Precautions / Restrictions Precautions Precautions: Fall Precaution Comments: watch O2     Mobility  Bed Mobility               General bed mobility comments: pt sitting EOB upon arrival    Transfers                   General transfer comment: deferred due to planned d/c    Ambulation/Gait                   Stairs             Wheelchair Mobility     Modified Rankin (Stroke Patients Only)       Balance                                            Cognition Arousal/Alertness: Awake/alert Behavior During Therapy: WFL for tasks assessed/performed Overall Cognitive Status: Within Functional Limits for tasks assessed                                          Exercises      General Comments General comments (skin integrity, edema, etc.): Educated on safety with 2 steps at different places in her home, to utilize rollator in locked position either at top or bottom of the 2 steps as she has no rails.  also showed options on shopping site for grabbars to install at door post      Pertinent Vitals/Pain Pain Assessment: Faces Faces Pain Scale: Hurts little more Pain Location: great toe (gout) Pain Descriptors / Indicators: Sore Pain Intervention(s): Monitored during session    Home Living  Prior Function            PT Goals (current goals can now be found in the care plan section) Progress towards PT goals: Progressing toward goals    Frequency    Min 3X/week      PT Plan Current plan remains appropriate    Co-evaluation              AM-PAC PT "6 Clicks" Mobility   Outcome Measure  Help needed turning from your back to your side while in a flat bed without using bedrails?: None Help needed moving from lying on your back to sitting on the side of a flat bed without using bedrails?: A Little Help needed moving to and from a bed to a chair (including a wheelchair)?: A Little Help needed standing up from a chair using your arms (e.g., wheelchair or bedside chair)?: A Little Help needed to walk in hospital room?: A Little Help needed climbing 3-5 steps with a railing? : A Little 6 Click Score: 19    End of Session Equipment Utilized During Treatment: Oxygen Activity Tolerance: Patient tolerated treatment well Patient left: in bed;with call  bell/phone within reach (seated EOB)   PT Visit Diagnosis: Other abnormalities of gait and mobility (R26.89);Muscle weakness (generalized) (M62.81);Difficulty in walking, not elsewhere classified (R26.2)     Time: 1040-1050 PT Time Calculation (min) (ACUTE ONLY): 10 min  Charges:  $Self Care/Home Management: 8-22                     Kathleen Hatfield, PT Acute Rehabilitation Services Pager:[phone] [phone] 02/18/2021    Kathleen Hatfield 02/18/2021, 11:32 AM

## 2021-02-18 NOTE — Discharge Summary (Signed)
Physician Discharge Summary  Kathleen Hatfield VOZ:[id number] DOB: 06/11/1955 DOA: 02/14/2021  PCP: [name], MD  Admit date: 02/14/2021 Discharge date: 02/18/2021  Admitted From: home Disposition:  home  Recommendations for Outpatient Follow-up:  Follow up with PCP in 1-2 weeks Please obtain BMP/CBC in one week  Home Health: PT, OT Equipment/Devices: home o2  Discharge Condition: stable CODE STATUS: Full code Diet recommendation: heart healthy  HPI: Per admitting MD, Kathleen Hatfield is a 65 y.o. female with medical history significant of COPD with asthma, chronic diastolic CHF, CKD stage III, depression, insulin-dependent type 2 diabetes, GERD, hyperlipidemia, hypertension, RLS, OSA on CPAP, ulcerative colitis.  Recently seen by PCP on 12/2 for COPD exacerbation and prescribed doxycycline and 10-day prednisone taper.  Subsequently seen in the ED and prescribed Augmentin and Flonase. She returns to the ED today via EMS for evaluation of shortness of breath and cough.  Reportedly oxygen saturation in the 70s at home prior to EMS arrival.  She was given albuterol neb, DuoNeb, and IV Solu-Medrol 125 mg in route.  Oxygen saturation in the 80s on room air in the ED, placed on 2 L supplemental oxygen.  Not febrile or tachycardic.  Labs showing WBC 11.8.  Sodium 122.  Potassium 5.6.  Chloride 83.  Blood glucose 703.  Bicarb and anion gap normal.  BUN 35.  Creatinine 1.6, baseline 1.0-1.4.  Influenza A PCR positive.  COVID-negative.  BNP normal.  Blood gas without evidence of metabolic acidosis.  Chest x-ray showing small left pleural effusion; no focal infiltrate. Patient was given albuterol neb, DuoNeb, 1 L LR bolus, and started on insulin infusion. Patient reports 3-week history of progressively worsening cough.  Her chest feels congested but she is not able to bring up any sputum.  States she was seen at urgent care 2 weeks ago when her boyfriend tested positive for influenza A but she tested  negative.  This past week she is having increasing shortness of breath.  She was prescribed prednisone, Augmentin, and doxycycline a week ago and has been taking them with no improvement.  She is also using her home COPD inhalers but they are not helping.  She is having chills and sweats.  Her oxygen saturation has been in the 80s at home and last night it dropped to 77% which prompted her to call EMS.  She has never smoked cigarettes but reports long-term secondhand smoke exposure as several family members smoked cigarettes.  States she normally uses [place] 90 units daily but increased the dose to 100 units daily 2 days ago as advised by her PCP as she is taking steroids.   Hospital Course / Discharge diagnoses: Principal Problem Acute hypoxic respiratory failure due to acute COPD/asthma exacerbation due to influenza A -patient was admitted to the hospital with respiratory failure in the setting of COPD exacerbation.  She tested positive for influenza A on admission.  She was placed on Tamiflu, steroids, nebulizers and oxygen with gradual improvement in her condition.  Patient reported longstanding dyspnea with exertion with suspected chronic hypoxemic respiratory failure.  Ambulatory O2 sats, she decided to the mid upper 80s, and qualifies for home oxygen which was prescribed prior to discharge.  With improvement he will be discharged home in stable condition, she will have 2 more days of steroids, finished Tamiflu while hospitalized.   Active Problems Insulin-dependent type 2 diabetes mellitus, hyperglycemic state-with hyperglycemia in the setting of steroid use.  She will go home on 10 mg of  prednisone for 2 additional days, she is to continue her home regimen for her diabetes Chronic kidney disease stage IIIa-Baseline creatinine 1.0-1.4, currently at baseline Leukocytosis-from steroids /viral infection UTI-patient with symptoms of UTI, developed yesterday.  She has been having increased UTIs over the  last 6 months and is scheduled to see a urologist in 2 days.  Given symptoms and positive UA we will do a short course of Keflex based on prior sensitivities. Hyperkalemia-resolved, potassium 4.4 Chronic diastolic CHF-euvolemic, resume home Lasix Hypertension-continue home medications Diabetic neuropathy -continue gabapentin RLS-continue Requip  Sepsis ruled out   Discharge Instructions   Allergies as of 02/18/2021       Reactions   Almond Oil Anaphylaxis, Shortness Of Breath, Swelling   Morphine And Related Shortness Of Breath, Other (See Comments)   Pt. States while in the hospital it affected her breathing, O2 dropped to the 70's   Atorvastatin Other (See Comments)   Leg weakness   Ceclor [cefaclor] Nausea And Vomiting   Sulfa Antibiotics Nausea And Vomiting   Ciprofloxacin Other (See Comments)   Makes joints and muscles ache   Levaquin [levofloxacin] Other (See Comments)   Body aches   Losartan Other (See Comments)   Weakness   Statins Other (See Comments)   Leg and body weakness        Medication List     STOP taking these medications    amoxicillin-clavulanate 875-125 MG tablet Commonly known as: AUGMENTIN   doxycycline 100 MG tablet Commonly known as: VIBRA-TABS       TAKE these medications    acetaminophen 500 MG tablet Commonly known as: TYLENOL Take 500 mg by mouth every 6 (six) hours as needed for moderate pain.   albuterol 108 (90 Base) MCG/ACT inhaler Commonly known as: VENTOLIN HFA Inhale 2 puffs into the lungs every 6 (six) hours as needed. What changed: reasons to take this   Bayer Contour Next Test test strip Generic drug: glucose blood 1 each by Other route daily. As directed   budesonide 0.5 MG/2ML nebulizer solution Commonly known as: PULMICORT Take 2 mLs (0.5 mg total) by nebulization 2 (two) times daily.   buPROPion 300 MG 24 hr tablet Commonly known as: WELLBUTRIN XL TAKE 1 TABLET BY MOUTH DAILY What changed: how much to  take   cephALEXin 500 MG capsule Commonly known as: KEFLEX Take 1 capsule (500 mg total) by mouth 2 (two) times daily for 5 days.   colchicine 0.6 MG tablet Take 1 tablet (0.6 mg total) by mouth daily as needed (for gout).   dicyclomine 20 MG tablet Commonly known as: BENTYL TAKE 1 TABLET UP TO FOUR TIMES A DAY FOR GI CRAMPING, PAIN, AND NAUSEA/VOMITING AS NEEDED What changed:  how much to take how to take this when to take this reasons to take this additional instructions   DULoxetine 60 MG capsule Commonly known as: CYMBALTA TAKE 1 CAPSULE BY MOUTH ONCE DAILY. What changed:  how to take this when to take this   fluticasone 50 MCG/ACT nasal spray Commonly known as: FLONASE Place 2 sprays into both nostrils daily.   furosemide 20 MG tablet Commonly known as: LASIX Take 3 tablets (60 mg total) by mouth 2 (two) times daily.   gabapentin 100 MG capsule Commonly known as: NEURONTIN TAKE 3 CAPSULES BY MOUTH AT BEDTIME. What changed: how to take this   Global Ease Inject Pen Needles 32G X 4 MM Misc Generic drug: Insulin Pen Needle USE AS DIRECTED WITH VICTOZA AND  TRESIBA.   guaiFENesin-dextromethorphan 100-10 MG/5ML syrup Commonly known as: ROBITUSSIN DM Take 5 mLs by mouth every 4 (four) hours as needed for cough.   ipratropium-albuterol 0.5-2.5 (3) MG/3ML Soln Commonly known as: DUONEB Take 3 mLs by nebulization every 6 (six) hours as needed (wheezing).   Iron (Ferrous Sulfate) 325 (65 Fe) MG Tabs Take 325 mg by mouth daily. What changed: when to take this   metolazone 5 MG tablet Commonly known as: ZAROXOLYN TAKE 1 TABLET BY MOUTH AS NEEDED. What changed:  when to take this reasons to take this   Microlet Lancets Misc 1 each by Other route. As directed   nystatin powder Commonly known as: MYCOSTATIN/NYSTOP Apply 1 application topically 3 (three) times daily. What changed:  when to take this reasons to take this   ondansetron 4 MG tablet Commonly  known as: ZOFRAN Take 1 tablet (4 mg total) by mouth every 6 (six) hours. What changed:  when to take this reasons to take this   oxyCODONE-acetaminophen 5-325 MG tablet Commonly known as: PERCOCET/ROXICET Take 1 tablet by mouth every 6 (six) hours as needed for severe pain.   pantoprazole 40 MG tablet Commonly known as: PROTONIX TAKE 1 TABLET BY MOUTH AT BEDTIME What changed:  how to take this when to take this   polyethylene glycol 17 g packet Commonly known as: MIRALAX / GLYCOLAX Take 17 g by mouth daily as needed for mild constipation.   potassium chloride 10 MEQ CR capsule Commonly known as: MICRO-K TAKE 5 CAPSULES (50 MEQ TOTAL) BY MOUTH 2 (TWO) TIMES DAILY   predniSONE 10 MG tablet Commonly known as: DELTASONE Take 1 tablet (10 mg total) by mouth daily with breakfast for 2 days. Start taking on: February 19, 2021 What changed:  medication strength how much to take how to take this when to take this additional instructions   propranolol 10 MG tablet Commonly known as: INDERAL TAKE 1 TABLET BY MOUTH 2 TIMES DAILY   rOPINIRole 4 MG tablet Commonly known as: REQUIP TAKE 1 TABLET BY MOUTH TWICE DAILY What changed: when to take this   spironolactone 25 MG tablet Commonly known as: ALDACTONE TAKE 1 TABLET BY MOUTH DAILY.   traMADol 50 MG tablet Commonly known as: ULTRAM Take 1 tablet (50 mg total) by mouth 3 (three) times daily as needed. What changed: reasons to take this   [place] FlexTouch 200 UNIT/ML FlexTouch Pen Generic drug: insulin degludec INJECT 90 UNITS UNDER SKIN ONCE DAILY What changed:  how much to take when to take this additional instructions   Trulicity 4.5 UK/0.2RK Sopn Generic drug: Dulaglutide Inject 4.5 mg as directed once a week.   UNABLE TO FIND CPAP- At bedtime   Vitamin D-3 125 MCG (5000 UT) Tabs Take 5,000 Units by mouth every other day.   Yupelri 175 MCG/3ML nebulizer solution Generic drug: revefenacin Take 3 mLs  (175 mcg total) by nebulization daily.               Durable Medical Equipment  (From admission, onward)           Start     Ordered   02/17/21 1029  For home use only DME oxygen  Once       Comments: COPD  Question Answer Comment  Length of Need Lifetime   Mode or (Route) Nasal cannula   Liters per Minute 2   Frequency Continuous (stationary and portable oxygen unit needed)   Oxygen delivery system Gas  02/17/21 1028            Follow-up Information     [facility] [place] Follow up.   Specialty: Home Health Services Why: For home health services Contact information: [place] [address] [phone]         [facility] Follow up.   Why: home oxygen Contact information: [place] [address] [phone]                 Consultations: none  Procedures/Studies:  DG Chest 2 View  Result Date: 02/07/2021 CLINICAL DATA:  exertional dyspnea EXAM: CHEST - 2 VIEW COMPARISON:  Radiograph 02/04/2021 FINDINGS: Unchanged cardiomediastinal silhouette. There is no new airspace disease. There is no large pleural effusion. No visible pneumothorax. Biapical pleuroparenchymal scarring. No acute osseous abnormality. Thoracic spondylosis. IMPRESSION: No evidence of acute cardiopulmonary disease. Electronically Signed   By: Maurine Simmering M.D.   On: 02/07/2021 15:50   DG Chest 2 View  Result Date: 02/04/2021 CLINICAL DATA:  Cough, shortness of breath. EXAM: CHEST - 2 VIEW COMPARISON:  October 25, 2020. FINDINGS: The heart size and mediastinal contours are within normal limits. Both lungs are clear. The visualized skeletal structures are unremarkable. IMPRESSION: No active cardiopulmonary disease. Electronically Signed   By: Marijo Conception M.D.   On: 02/04/2021 15:27   DG Chest Port 1 View  Result Date: 02/14/2021 CLINICAL DATA:  Shortness of breath EXAM: PORTABLE CHEST 1 VIEW COMPARISON:   02/07/2021 FINDINGS: Cardiac shadow is mildly prominent. Small left pleural effusion is noted. No focal infiltrate is seen. No bony abnormality is noted. IMPRESSION: Small left pleural effusion. Electronically Signed   By: Inez Catalina M.D.   On: 02/14/2021 02:54     Subjective: - no chest pain, shortness of breath, no abdominal pain, nausea or vomiting.   Discharge Exam: BP 102/62 (BP Location: Left Arm)   Pulse 63   Temp 98.3 F (36.8 C) (Oral)   Resp 16   Ht 5' 5"  (1.651 m)   Wt 118 kg   SpO2 91%   BMI 43.30 kg/m   General: Pt is alert, awake, not in acute distress Cardiovascular: RRR, S1/S2 +, no rubs, no gallops Respiratory: CTA bilaterally, no wheezing, no rhonchi Abdominal: Soft, NT, ND, bowel sounds + Extremities: no edema, no cyanosis  The results of significant diagnostics from this hospitalization (including imaging, microbiology, ancillary and laboratory) are listed below for reference.     Microbiology: Recent Results (from the past 240 hour(s))  Resp Panel by RT-PCR (Flu A&B, Covid) Nasopharyngeal Swab     Status: Abnormal   Collection Time: 02/14/21 12:49 AM   Specimen: Nasopharyngeal Swab; Nasopharyngeal(NP) swabs in vial transport medium  Result Value Ref Range Status   SARS Coronavirus 2 by RT PCR NEGATIVE NEGATIVE Final    Comment: (NOTE) SARS-CoV-2 target nucleic acids are NOT DETECTED.  The SARS-CoV-2 RNA is generally detectable in upper respiratory specimens during the acute phase of infection. The lowest concentration of SARS-CoV-2 viral copies this assay can detect is 138 copies/mL. A negative result does not preclude SARS-Cov-2 infection and should not be used as the sole basis for treatment or other patient management decisions. A negative result may occur with  improper specimen collection/handling, submission of specimen other than nasopharyngeal swab, presence of viral mutation(s) within the areas targeted by this assay, and inadequate number  of viral copies(<138 copies/mL). A negative result must be combined  with clinical observations, patient history, and epidemiological information. The expected result is Negative.  Fact Sheet for Patients:  EntrepreneurPulse.com.au  Fact Sheet for Healthcare Providers:  IncredibleEmployment.be  This test is no t yet approved or cleared by the [place] FDA and  has been authorized for detection and/or diagnosis of SARS-CoV-2 by FDA under an Emergency Use Authorization (EUA). This EUA will remain  in effect (meaning this test can be used) for the duration of the COVID-19 declaration under Section 564(b)(1) of the Act, 21 U.S.C.section 360bbb-3(b)(1), unless the authorization is terminated  or revoked sooner.       Influenza A by PCR POSITIVE (A) NEGATIVE Final   Influenza B by PCR NEGATIVE NEGATIVE Final    Comment: (NOTE) The Xpert Xpress SARS-CoV-2/FLU/RSV plus assay is intended as an aid in the diagnosis of influenza from Nasopharyngeal swab specimens and should not be used as a sole basis for treatment. Nasal washings and aspirates are unacceptable for Xpert Xpress SARS-CoV-2/FLU/RSV testing.  Fact Sheet for Patients: EntrepreneurPulse.com.au  Fact Sheet for Healthcare Providers: IncredibleEmployment.be  This test is not yet approved or cleared by the [place] FDA and has been authorized for detection and/or diagnosis of SARS-CoV-2 by FDA under an Emergency Use Authorization (EUA). This EUA will remain in effect (meaning this test can be used) for the duration of the COVID-19 declaration under Section 564(b)(1) of the Act, 21 U.S.C. section 360bbb-3(b)(1), unless the authorization is terminated or revoked.  Performed at [facility], [place] [address], [place], [place] [address]      Labs: Basic Metabolic Panel: Recent Labs  Lab 02/14/21 0109 02/14/21 0501 02/14/21 0659  02/14/21 1010 02/14/21 2140 02/16/21 0040  NA 122* 125* 131* 132*  --  133*  K 5.6* 6.6* 4.6 4.4  --  4.4  CL 83*  --  91* 93*  --  93*  CO2 26  --  24 27  --  32  GLUCOSE 703*  --  334* 231* 408* 253*  BUN 35*  --  34* 30*  --  28*  CREATININE 1.67*  --  1.43* 1.27*  --  1.24*  CALCIUM 9.3  --  9.4 9.7  --  9.5   Liver Function Tests: No results for input(s): AST, ALT, ALKPHOS, BILITOT, PROT, ALBUMIN in the last 168 hours. CBC: Recent Labs  Lab 02/14/21 0109 02/14/21 0501 02/14/21 0659 02/16/21 0040  WBC 11.8*  --  11.9* 16.1*  NEUTROABS 8.4*  --   --   --   HGB 15.0 16.7* 14.7 13.7  HCT 46.9* 49.0* 44.4 42.3  MCV 91.6  --  89.9 92.6  PLT 311  --  313 259   CBG: Recent Labs  Lab 02/17/21 0622 02/17/21 1137 02/17/21 1641 02/17/21 2133 02/18/21 0603  GLUCAP 169* 220* 343* 279* 85   Hgb A1c No results for input(s): HGBA1C in the last 72 hours. Lipid Profile No results for input(s): CHOL, HDL, LDLCALC, TRIG, CHOLHDL, LDLDIRECT in the last 72 hours. Thyroid function studies No results for input(s): TSH, T4TOTAL, T3FREE, THYROIDAB in the last 72 hours.  Invalid input(s): FREET3 Urinalysis    Component Value Date/Time   COLORURINE YELLOW 02/17/2021 2319   APPEARANCEUR CLOUDY (A) 02/17/2021 2319   LABSPEC 1.015 02/17/2021 2319   PHURINE 5.5 02/17/2021 2319   GLUCOSEU >=500 (A) 02/17/2021 2319   HGBUR NEGATIVE 02/17/2021 [place] 02/17/2021 2319   BILIRUBINUR negative 12/24/2020 1104   BILIRUBINUR negative 12/25/2019 1515  KETONESUR NEGATIVE 02/17/2021 2319   PROTEINUR NEGATIVE 02/17/2021 2319   UROBILINOGEN 0.2 12/24/2020 1104   UROBILINOGEN 0.2 09/19/2008 1400   NITRITE NEGATIVE 02/17/2021 2319   LEUKOCYTESUR TRACE (A) 02/17/2021 2319    FURTHER DISCHARGE INSTRUCTIONS:   Get Medicines reviewed and adjusted: Please take all your medications with you for your next visit with your Primary MD   Laboratory/radiological data: Please  request your Primary MD to go over all hospital tests and procedure/radiological results at the follow up, please ask your Primary MD to get all Hospital records sent to his/her office.   In some cases, they will be blood work, cultures and biopsy results pending at the time of your discharge. Please request that your primary care M.D. goes through all the records of your hospital data and follows up on these results.   Also Note the following: If you experience worsening of your admission symptoms, develop shortness of breath, life threatening emergency, suicidal or homicidal thoughts you must seek medical attention immediately by calling 911 or calling your MD immediately  if symptoms less severe.   You must read complete instructions/literature along with all the possible adverse reactions/side effects for all the Medicines you take and that have been prescribed to you. Take any new Medicines after you have completely understood and accpet all the possible adverse reactions/side effects.    Do not drive when taking Pain medications or sleeping medications (Benzodaizepines)   Do not take more than prescribed Pain, Sleep and Anxiety Medications. It is not advisable to combine anxiety,sleep and pain medications without talking with your primary care practitioner   Special Instructions: If you have smoked or chewed Tobacco  in the last 2 yrs please stop smoking, stop any regular Alcohol  and or any Recreational drug use.   Wear Seat belts while driving.   Please note: You were cared for by a hospitalist during your hospital stay. Once you are discharged, your primary care physician will handle any further medical issues. Please note that NO REFILLS for any discharge medications will be authorized once you are discharged, as it is imperative that you return to your primary care physician (or establish a relationship with a primary care physician if you do not have one) for your post hospital  discharge needs so that they can reassess your need for medications and monitor your lab values.  Time coordinating discharge: 40 minutes  SIGNED:  Marzetta Board, MD, PhD 02/18/2021, 8:33 AM

## 2021-02-19 ENCOUNTER — Telehealth: Payer: Self-pay

## 2021-02-19 DIAGNOSIS — J9601 Acute respiratory failure with hypoxia: Secondary | ICD-10-CM | POA: Diagnosis not present

## 2021-02-19 DIAGNOSIS — J45901 Unspecified asthma with (acute) exacerbation: Secondary | ICD-10-CM | POA: Diagnosis not present

## 2021-02-19 DIAGNOSIS — N390 Urinary tract infection, site not specified: Secondary | ICD-10-CM | POA: Diagnosis not present

## 2021-02-19 DIAGNOSIS — I130 Hypertensive heart and chronic kidney disease with heart failure and stage 1 through stage 4 chronic kidney disease, or unspecified chronic kidney disease: Secondary | ICD-10-CM | POA: Diagnosis not present

## 2021-02-19 DIAGNOSIS — I5032 Chronic diastolic (congestive) heart failure: Secondary | ICD-10-CM | POA: Diagnosis not present

## 2021-02-19 DIAGNOSIS — J441 Chronic obstructive pulmonary disease with (acute) exacerbation: Secondary | ICD-10-CM | POA: Diagnosis not present

## 2021-02-19 DIAGNOSIS — J09X2 Influenza due to identified novel influenza A virus with other respiratory manifestations: Secondary | ICD-10-CM | POA: Diagnosis not present

## 2021-02-19 DIAGNOSIS — N1831 Chronic kidney disease, stage 3a: Secondary | ICD-10-CM | POA: Diagnosis not present

## 2021-02-19 DIAGNOSIS — D631 Anemia in chronic kidney disease: Secondary | ICD-10-CM | POA: Diagnosis not present

## 2021-02-19 DIAGNOSIS — E1122 Type 2 diabetes mellitus with diabetic chronic kidney disease: Secondary | ICD-10-CM | POA: Diagnosis not present

## 2021-02-19 DIAGNOSIS — J101 Influenza due to other identified influenza virus with other respiratory manifestations: Secondary | ICD-10-CM | POA: Diagnosis not present

## 2021-02-19 NOTE — Telephone Encounter (Signed)
Transition Care Management Follow-up Telephone Call Date of discharge and from where: 02/18/21 from [facility] How have you been since you were released from the hospital? Patient states feeling better since discharge. Any questions or concerns? No  Items Reviewed: Did the pt receive and understand the discharge instructions provided? Yes  Medications obtained and verified? Yes  Other? No  Any new allergies since your discharge? No  Dietary orders reviewed? Yes Do you have support at home? Yes   Home Care and Equipment/Supplies: Were home health services ordered? yes If so, what is the name of the agency? [facility]  Has the agency set up a time to come to the patient's home? yes Were any new equipment or medical supplies ordered?  Yes: , oxygen What is the name of the medical supply agency? [facility] Were you able to get the supplies/equipment? yes Do you have any questions related to the use of the equipment or supplies? No  Functional Questionnaire: (I = Independent and D = Dependent) ADLs: I  Bathing/Dressing- I  Meal Prep- I with assistance  Eating- I  Maintaining continence- I  Transferring/Ambulation- I with assistance of rollator  Managing Meds- I  Follow up appointments reviewed:  PCP Hospital f/u appt confirmed? Yes  Scheduled to see Dr. [name] on 02/27/21 @ 11:30am. [facility] f/u appt confirmed? Yes  Scheduled to see [name] on 03/25/21 @ 2:45pm. Are transportation arrangements needed? No  If their condition worsens, is the pt aware to call PCP or go to the Emergency Dept.? Yes Was the patient provided with contact information for the PCP's office or ED? Yes Was to pt encouraged to call back with questions or concerns? Yes

## 2021-02-24 ENCOUNTER — Telehealth: Payer: Self-pay | Admitting: Family Medicine

## 2021-02-24 DIAGNOSIS — N1831 Chronic kidney disease, stage 3a: Secondary | ICD-10-CM | POA: Diagnosis not present

## 2021-02-24 DIAGNOSIS — I5032 Chronic diastolic (congestive) heart failure: Secondary | ICD-10-CM | POA: Diagnosis not present

## 2021-02-24 DIAGNOSIS — J45901 Unspecified asthma with (acute) exacerbation: Secondary | ICD-10-CM | POA: Diagnosis not present

## 2021-02-24 DIAGNOSIS — J441 Chronic obstructive pulmonary disease with (acute) exacerbation: Secondary | ICD-10-CM | POA: Diagnosis not present

## 2021-02-24 DIAGNOSIS — D631 Anemia in chronic kidney disease: Secondary | ICD-10-CM | POA: Diagnosis not present

## 2021-02-24 DIAGNOSIS — I130 Hypertensive heart and chronic kidney disease with heart failure and stage 1 through stage 4 chronic kidney disease, or unspecified chronic kidney disease: Secondary | ICD-10-CM | POA: Diagnosis not present

## 2021-02-24 DIAGNOSIS — J9601 Acute respiratory failure with hypoxia: Secondary | ICD-10-CM | POA: Diagnosis not present

## 2021-02-24 DIAGNOSIS — N390 Urinary tract infection, site not specified: Secondary | ICD-10-CM | POA: Diagnosis not present

## 2021-02-24 DIAGNOSIS — J101 Influenza due to other identified influenza virus with other respiratory manifestations: Secondary | ICD-10-CM | POA: Diagnosis not present

## 2021-02-24 DIAGNOSIS — E1122 Type 2 diabetes mellitus with diabetic chronic kidney disease: Secondary | ICD-10-CM | POA: Diagnosis not present

## 2021-02-24 NOTE — Telephone Encounter (Signed)
[facility] is wanting Dr. [name] to know-the pt had an OT eval today and does not need any further visits. [facility] is from [facility] phone # [phone] if any other questions.

## 2021-02-25 ENCOUNTER — Telehealth: Payer: Self-pay

## 2021-02-25 NOTE — Chronic Care Management (AMB) (Addendum)
Chronic Care Management Pharmacy Assistant   Name: Kathleen Hatfield  MRN: [id number] DOB: 1955/07/05   Reason for Encounter: Diabetes Disease State  Recent office visits:  02/07/21-Family Medicine-Patient presented for COPD exacerbation.STAT chest xray. START prednisone 24m  X 5 days,268mX 5 days, doxycycline 10052mwice daily X 10 days ordered, discussed adjusting insulin up by 10 units when BG > 200.  Recent consult visits:  02/08/21-[place] [facility]-Patient presented for COPD exacerbation -started DuoNeb treatment and discharged to home.Add augmentin and flonase prescribed. 02/04/21-[facility]-Patient presented for nasal congestion and cough.Xray,labs, continue albuterol and prednisone 23m75mke 4 tablets daily for 5 days   Hospital visits:  Medication Reconciliation was completed by comparing discharge summary, patients EMR and Pharmacy list, and upon discussion with patient.  Admitted to the hospital on 02/14/21 due to COPD exacerbation. Discharge date was 02/18/21. [facility] to [facility]/asthma exacerbation due to influenza A -patient was admitted to the hospital with respiratory failure in the setting of COPD exacerbation.  She tested positive for influenza A on admission.  She was placed on Tamiflu, steroids, nebulizers and oxygen with gradual improvement in her condition.  Patient reported longstanding dyspnea with exertion with suspected chronic hypoxemic respiratory failure.  Ambulatory O2 sats, she decided to the mid upper 80s, and qualifies for home oxygen which was prescribed prior to discharge.  With improvement he will be discharged home in stable condition, she will have 2 more days of steroids, finished Tamiflu while hospitalized.    [facility] -started Keflex 500mg63me 2 times daily -started Guaifenesin 100-23mg/19m    Medication Changes at  Hospital Discharge: -Changed prednisone  Medications Discontinued at Hospital Discharge: -Stopped amoxicillin   -stopped-doxycycline  Medications that remain the same after Hospital Discharge:??  -All other medications will remain the same.    Medications: Outpatient Encounter Medications as of 02/25/2021  Medication Sig   acetaminophen (TYLENOL) 500 MG tablet Take 500 mg by mouth every 6 (six) hours as needed for moderate pain.   albuterol (VENTOLIN HFA) 108 (90 Base) MCG/ACT inhaler Inhale 2 puffs into the lungs every 6 (six) hours as needed. (Patient taking differently: Inhale 2 puffs into the lungs every 6 (six) hours as needed for shortness of breath or wheezing.)   BAYER CONTOUR NEXT TEST test strip 1 each by Other route daily. As directed   budesonide (PULMICORT) 0.5 MG/2ML nebulizer solution Take 2 mLs (0.5 mg total) by nebulization 2 (two) times daily.   buPROPion (WELLBUTRIN XL) 300 MG 24 hr tablet TAKE 1 TABLET BY MOUTH DAILY (Patient taking differently: Take 300 mg by mouth daily.)   Cholecalciferol (VITAMIN D-3) 5000 UNITS TABS Take 5,000 Units by mouth every other day.    colchicine 0.6 MG tablet Take 1 tablet (0.6 mg total) by mouth daily as needed (for gout).   dicyclomine (BENTYL) 20 MG tablet TAKE 1 TABLET UP TO FOUR TIMES A DAY FOR GI CRAMPING, PAIN, AND NAUSEA/VOMITING AS NEEDED (Patient taking differently: Take 20 mg by mouth 4 (four) times daily as needed for spasms.)   Dulaglutide (TRULICITY) 4.5 MG/0.5DT/2.6ZTInject 4.5 mg as directed once a week. (Patient not taking: Reported on 02/14/2021)   DULoxetine (CYMBALTA) 60 MG capsule TAKE 1 CAPSULE BY MOUTH ONCE DAILY. (Patient taking differently: 60 mg at bedtime.)   fluticasone (FLONASE) 50 MCG/ACT nasal spray Place 2 sprays into both nostrils daily.  furosemide (LASIX) 20 MG tablet Take 3 tablets (60 mg total) by mouth 2 (two) times daily. (Patient not taking: Reported on 02/14/2021)   gabapentin (NEURONTIN) 100 MG  capsule TAKE 3 CAPSULES BY MOUTH AT BEDTIME. (Patient taking differently: 300 mg at bedtime.)   GLOBAL EASE INJECT PEN NEEDLES 32G X 4 MM MISC USE AS DIRECTED WITH VICTOZA AND TRESIBA.   guaiFENesin-dextromethorphan (ROBITUSSIN DM) 100-10 MG/5ML syrup Take 5 mLs by mouth every 4 (four) hours as needed for cough.   insulin degludec (TRESIBA FLEXTOUCH) 200 UNIT/ML FlexTouch Pen INJECT 90 UNITS UNDER SKIN ONCE DAILY (Patient taking differently: 100 Units daily.)   ipratropium-albuterol (DUONEB) 0.5-2.5 (3) MG/3ML SOLN Take 3 mLs by nebulization every 6 (six) hours as needed (wheezing).   Iron, Ferrous Sulfate, 325 (65 Fe) MG TABS Take 325 mg by mouth daily. (Patient taking differently: Take 325 mg by mouth every other day.)   metolazone (ZAROXOLYN) 5 MG tablet TAKE 1 TABLET BY MOUTH AS NEEDED. (Patient taking differently: Take 5 mg by mouth daily as needed (swelling).)   MICROLET LANCETS MISC 1 each by Other route. As directed   nystatin (MYCOSTATIN/NYSTOP) powder Apply 1 application topically 3 (three) times daily. (Patient taking differently: Apply 1 application topically daily as needed (rash).)   ondansetron (ZOFRAN) 4 MG tablet Take 1 tablet (4 mg total) by mouth every 6 (six) hours. (Patient taking differently: Take 4 mg by mouth every 8 (eight) hours as needed for vomiting or nausea.)   oxyCODONE-acetaminophen (PERCOCET/ROXICET) 5-325 MG tablet Take 1 tablet by mouth every 6 (six) hours as needed for severe pain.   pantoprazole (PROTONIX) 40 MG tablet TAKE 1 TABLET BY MOUTH AT BEDTIME (Patient taking differently: 40 mg daily.)   polyethylene glycol (MIRALAX / GLYCOLAX) packet Take 17 g by mouth daily as needed for mild constipation.   potassium chloride (MICRO-K) 10 MEQ CR capsule TAKE 5 CAPSULES (50 MEQ TOTAL) BY MOUTH 2 (TWO) TIMES DAILY   propranolol (INDERAL) 10 MG tablet TAKE 1 TABLET BY MOUTH 2 TIMES DAILY (Patient taking differently: Take 10 mg by mouth 2 (two) times daily.)   revefenacin  (YUPELRI) 175 MCG/3ML nebulizer solution Take 3 mLs (175 mcg total) by nebulization daily.   rOPINIRole (REQUIP) 4 MG tablet TAKE 1 TABLET BY MOUTH TWICE DAILY (Patient taking differently: Take 4 mg by mouth in the morning and at bedtime.)   spironolactone (ALDACTONE) 25 MG tablet TAKE 1 TABLET BY MOUTH DAILY. (Patient taking differently: Take 25 mg by mouth daily.)   traMADol (ULTRAM) 50 MG tablet Take 1 tablet (50 mg total) by mouth 3 (three) times daily as needed. (Patient taking differently: Take 50 mg by mouth 3 (three) times daily as needed for moderate pain.)   UNABLE TO FIND CPAP- At bedtime   No facility-administered encounter medications on file as of 02/25/2021.     Recent Relevant Labs: Lab Results  Component Value Date/Time   HGBA1C 10.9 (H) 02/14/2021 06:59 AM   HGBA1C 9.4 (H) 11/28/2020 03:56 PM    Kidney Function Lab Results  Component Value Date/Time   CREATININE 1.24 (H) 02/16/2021 12:40 AM   CREATININE 1.27 (H) 02/14/2021 10:10 AM   CREATININE 1.16 09/04/2019 12:00 AM   CREATININE 1.3 04/26/2019 12:00 AM   GFR 38.07 (L) 11/28/2020 03:56 PM   GFRNONAA 48 (L) 02/16/2021 12:40 AM   GFRAA 69 01/19/2020 01:56 PM     Contacted patient on 03/04/21 to discuss diabetes disease state.   Current antihyperglycemic regimen:  Trulicity  4.5 mg - Inject once weekly (Thursday or Friday) - pt took first dose on 02/27/21 Tresiba - 100 units daily (mid-day around 1- 4 PM)    Patient verbally confirms she is taking the above medications as directed. Yes  What diet changes have been made to improve diabetes control? None identified  What recent interventions/DTPs have been made to improve glycemic control:  The patient reports she was out of the Trulicity for the last month due to backorder per the pharmacy ([facility]), she got it from her usual pharmacy. The [facility] did not have it in stock. She only received a 1 month supply.  Have there been any recent hospitalizations or ED  visits since last visit with CPP? Yes 02/14/21-thru 02/18/21 Admission to [facility] due to hyperglycemia.  Patient denies hypoglycemic symptoms, including Pale, Sweaty, Shaky, Hungry, Nervous/irritable, and Vision changes  Patient reports hyperglycemic symptoms, including fatigue and weakness,recurrent infections.The patient has appointment with lab on 03/06/21.  How often are you checking your blood sugar? once daily The patient has not been taking BG's until recently due to medications being off schedule and she has had prednisone for cough and congestion.  What are your blood sugars ranging?  Fasting: 03/05/21 -  508   6:50 am- patient ate penne pasta with hamburger at 4:30am Bedtime: 03/05/21 - 418  11:40 pm                 03/06/21 - 392  12:30 am       03/06/21 - 366   5:30 am Fasting     03/06/21 - 313  10:00 am  During the week, how often does your blood glucose drop below 70? Never  Are you checking your feet daily/regularly? Yes  Adherence Review: Is the patient currently on a STATIN medication? No Is the patient currently on ACE/ARB medication? No Does the patient have >5 day gap between last estimated fill dates? No  Care Gaps: Annual wellness visit in last year? Yes Most recent A1C reading: 10.9  02/14/21 Most Recent BP reading: 150/84  88-P 02/07/21  Last eye exam / retinopathy screening:UTD Last diabetic foot exam:UTD  Counseled patient on importance of annual eye and foot exam. The patient is up to date  Star Rating Drugs:  Medication:  Last Fill: Day Supply Tresiba 200unit/ml      12/12/20              20 Trulicity 1.6BW/4.6      02/24/21            30  PCP appointment on 02/27/21  Debbora Dus, CPP notified  [name], [place] Assistant [phone]  I have reviewed the care management and care coordination activities outlined in this encounter and I am certifying that I agree with the content of this note. See  addendum.  Debbora Dus, PharmD Clinical Pharmacist [facility] [phone]

## 2021-02-25 NOTE — Telephone Encounter (Signed)
Noted. Thanks.

## 2021-02-26 ENCOUNTER — Other Ambulatory Visit: Payer: Self-pay | Admitting: Family Medicine

## 2021-02-26 DIAGNOSIS — J101 Influenza due to other identified influenza virus with other respiratory manifestations: Secondary | ICD-10-CM | POA: Diagnosis not present

## 2021-02-26 DIAGNOSIS — J9601 Acute respiratory failure with hypoxia: Secondary | ICD-10-CM | POA: Diagnosis not present

## 2021-02-26 DIAGNOSIS — J441 Chronic obstructive pulmonary disease with (acute) exacerbation: Secondary | ICD-10-CM | POA: Diagnosis not present

## 2021-02-26 DIAGNOSIS — D631 Anemia in chronic kidney disease: Secondary | ICD-10-CM | POA: Diagnosis not present

## 2021-02-26 DIAGNOSIS — I130 Hypertensive heart and chronic kidney disease with heart failure and stage 1 through stage 4 chronic kidney disease, or unspecified chronic kidney disease: Secondary | ICD-10-CM | POA: Diagnosis not present

## 2021-02-26 DIAGNOSIS — I5032 Chronic diastolic (congestive) heart failure: Secondary | ICD-10-CM | POA: Diagnosis not present

## 2021-02-26 DIAGNOSIS — N1831 Chronic kidney disease, stage 3a: Secondary | ICD-10-CM | POA: Diagnosis not present

## 2021-02-26 DIAGNOSIS — E1122 Type 2 diabetes mellitus with diabetic chronic kidney disease: Secondary | ICD-10-CM | POA: Diagnosis not present

## 2021-02-26 DIAGNOSIS — N390 Urinary tract infection, site not specified: Secondary | ICD-10-CM | POA: Diagnosis not present

## 2021-02-26 DIAGNOSIS — J45901 Unspecified asthma with (acute) exacerbation: Secondary | ICD-10-CM | POA: Diagnosis not present

## 2021-02-27 ENCOUNTER — Encounter: Payer: Self-pay | Admitting: Family Medicine

## 2021-02-27 ENCOUNTER — Ambulatory Visit (INDEPENDENT_AMBULATORY_CARE_PROVIDER_SITE_OTHER): Payer: Medicare HMO | Admitting: Family Medicine

## 2021-02-27 ENCOUNTER — Other Ambulatory Visit: Payer: Self-pay

## 2021-02-27 ENCOUNTER — Ambulatory Visit (INDEPENDENT_AMBULATORY_CARE_PROVIDER_SITE_OTHER)
Admission: RE | Admit: 2021-02-27 | Discharge: 2021-02-27 | Disposition: A | Discharge status: Home / Self Care | Payer: Medicare HMO | Source: Ambulatory Visit | Attending: Family Medicine | Admitting: Family Medicine

## 2021-02-27 VITALS — BP 142/80 | HR 88 | Temp 97.1°F | Ht 65.0 in | Wt 266.0 lb

## 2021-02-27 DIAGNOSIS — E119 Type 2 diabetes mellitus without complications: Secondary | ICD-10-CM | POA: Diagnosis not present

## 2021-02-27 DIAGNOSIS — I517 Cardiomegaly: Secondary | ICD-10-CM | POA: Diagnosis not present

## 2021-02-27 DIAGNOSIS — J441 Chronic obstructive pulmonary disease with (acute) exacerbation: Secondary | ICD-10-CM

## 2021-02-27 DIAGNOSIS — R0602 Shortness of breath: Secondary | ICD-10-CM

## 2021-02-27 DIAGNOSIS — E1165 Type 2 diabetes mellitus with hyperglycemia: Secondary | ICD-10-CM

## 2021-02-27 DIAGNOSIS — R059 Cough, unspecified: Secondary | ICD-10-CM | POA: Diagnosis not present

## 2021-02-27 LAB — BASIC METABOLIC PANEL
BUN: 19 mg/dL (ref 6–23)
CO2: 33 mEq/L — ABNORMAL HIGH (ref 19–32)
Calcium: 9.8 mg/dL (ref 8.4–10.5)
Chloride: 96 mEq/L (ref 96–112)
Creatinine, Ser: 1.17 mg/dL (ref 0.40–1.20)
GFR: 48.76 mL/min — ABNORMAL LOW (ref >60.00)
Glucose, Bld: 305 mg/dL — ABNORMAL HIGH (ref 70–99)
Potassium: 4.4 mEq/L (ref 3.5–5.1)
Sodium: 137 mEq/L (ref 135–145)

## 2021-02-27 LAB — CBC WITH DIFFERENTIAL/PLATELET
Basophils Absolute: 0.1 10*3/uL (ref 0.0–0.1)
Basophils Relative: 1.2 % (ref 0.0–3.0)
Eosinophils Absolute: 0.1 10*3/uL (ref 0.0–0.7)
Eosinophils Relative: 1.1 % (ref 0.0–5.0)
HCT: 41.9 % (ref 36.0–46.0)
Hemoglobin: 13.6 g/dL (ref 12.0–15.0)
Lymphocytes Relative: 25.9 % (ref 12.0–46.0)
Lymphs Abs: 2.7 10*3/uL (ref 0.7–4.0)
MCHC: 32.6 g/dL (ref 30.0–36.0)
MCV: 91.5 fl (ref 78.0–100.0)
Monocytes Absolute: 1.1 10*3/uL — ABNORMAL HIGH (ref 0.1–1.0)
Monocytes Relative: 10.1 % (ref 3.0–12.0)
Neutro Abs: 6.5 10*3/uL (ref 1.4–7.7)
Neutrophils Relative %: 61.7 % (ref 43.0–77.0)
Platelets: 360 10*3/uL (ref 150.0–400.0)
RBC: 4.58 Mil/uL (ref 3.87–5.11)
RDW: 15.4 % (ref 11.5–15.5)
WBC: 10.6 10*3/uL — ABNORMAL HIGH (ref 4.0–10.5)

## 2021-02-27 LAB — BRAIN NATRIURETIC PEPTIDE: Pro B Natriuretic peptide (BNP): 23 pg/mL (ref 0.0–100.0)

## 2021-02-27 MED ORDER — TRESIBA FLEXTOUCH 200 UNIT/ML ~~LOC~~ SOPN: 100.0000 [IU] | PEN_INJECTOR | Freq: Every day | SUBCUTANEOUS | Status: DC

## 2021-02-27 MED ORDER — IRON (FERROUS SULFATE) 325 (65 FE) MG PO TABS: 325.0000 mg | ORAL_TABLET | ORAL | Status: DC

## 2021-02-27 MED ORDER — PROMETHAZINE-DM 6.25-15 MG/5ML PO SYRP: 2.5000 mL | ORAL_SOLUTION | Freq: Four times a day (QID) | ORAL | 1 refills | Status: DC | PRN

## 2021-02-27 MED ORDER — BENZONATATE 200 MG PO CAPS: 200.0000 mg | ORAL_CAPSULE | Freq: Three times a day (TID) | ORAL | 1 refills | Status: DC | PRN

## 2021-02-27 NOTE — Patient Instructions (Addendum)
Go to the lab on the way out.   If you have mychart we'll likely use that to update you.    Take care.  Glad to see you. Use the cough medicine in the meantime.

## 2021-02-27 NOTE — Progress Notes (Signed)
This visit occurred during the SARS-CoV-2 public health emergency.  Safety protocols were in place, including screening questions prior to the visit, additional usage of staff PPE, and extensive cleaning of exam room while observing appropriate contact time as indicated for disinfecting solutions.  Inpatient f/u.  She was admitted with flu with hyperglycemia exacerbated by steroid use.  She has clearly improved compared to her status at admission.  She is back home now.No fevers.  Still with cough, can get SOB with coughing episodes.  D/w about using tesslon for cough.  She noted some BLE edema.  No recent metolazone use.   D/w pt about taking 1 dose of metolazone today.  Taking 100 units insulin.  Sugar has been 140-160 recently, since off prednisone.    She hasn't been able to get trulicity for the last 2 months.  She was shorted at the pharmacy, per her report.    Meds, vitals, and allergies reviewed.   ROS: Per HPI unless specifically indicated in ROS section   GEN: nad, alert and oriented HEENT: ncat NECK: supple w/o LA CV: rrr.  no murmur PULM: No focal dec in BS but course BS B.   ABD: soft, +bs EXT: no edema SKIN: well perfused.   30 minutes were devoted to patient care in this encounter (this includes time spent reviewing the patient's file/history, interviewing and examining the patient, counseling/reviewing plan with patient).

## 2021-02-28 ENCOUNTER — Other Ambulatory Visit: Payer: Self-pay | Admitting: Family Medicine

## 2021-02-28 DIAGNOSIS — R0602 Shortness of breath: Secondary | ICD-10-CM

## 2021-03-05 NOTE — Assessment & Plan Note (Signed)
She has not been able to get Trulicity and I will be checking on options in the meantime.  Her sugar has improved since she has been off prednisone.

## 2021-03-05 NOTE — Assessment & Plan Note (Signed)
Exacerbation related to flu.  Improved in the meantime.  Discussed taking 1 dose of metolazone to see if that helped with her breathing and cough in the meantime.  She can use Tessalon for cough.  Still okay for outpatient follow-up at this point.  See notes on x-ray.

## 2021-03-06 ENCOUNTER — Ambulatory Visit (INDEPENDENT_AMBULATORY_CARE_PROVIDER_SITE_OTHER)
Admission: RE | Admit: 2021-03-06 | Discharge: 2021-03-06 | Disposition: A | Discharge status: Home / Self Care | Payer: Medicare HMO | Source: Ambulatory Visit | Attending: Family Medicine | Admitting: Family Medicine

## 2021-03-06 ENCOUNTER — Other Ambulatory Visit: Payer: Medicare HMO

## 2021-03-06 DIAGNOSIS — Z8709 Personal history of other diseases of the respiratory system: Secondary | ICD-10-CM | POA: Diagnosis not present

## 2021-03-06 DIAGNOSIS — R0602 Shortness of breath: Secondary | ICD-10-CM

## 2021-03-06 DIAGNOSIS — R918 Other nonspecific abnormal finding of lung field: Secondary | ICD-10-CM | POA: Diagnosis not present

## 2021-03-06 DIAGNOSIS — I700 Atherosclerosis of aorta: Secondary | ICD-10-CM | POA: Diagnosis not present

## 2021-03-07 ENCOUNTER — Telehealth: Payer: Self-pay

## 2021-03-07 DIAGNOSIS — J441 Chronic obstructive pulmonary disease with (acute) exacerbation: Secondary | ICD-10-CM | POA: Diagnosis not present

## 2021-03-07 DIAGNOSIS — D631 Anemia in chronic kidney disease: Secondary | ICD-10-CM | POA: Diagnosis not present

## 2021-03-07 DIAGNOSIS — N390 Urinary tract infection, site not specified: Secondary | ICD-10-CM | POA: Diagnosis not present

## 2021-03-07 DIAGNOSIS — I5032 Chronic diastolic (congestive) heart failure: Secondary | ICD-10-CM | POA: Diagnosis not present

## 2021-03-07 DIAGNOSIS — E1122 Type 2 diabetes mellitus with diabetic chronic kidney disease: Secondary | ICD-10-CM | POA: Diagnosis not present

## 2021-03-07 DIAGNOSIS — J45901 Unspecified asthma with (acute) exacerbation: Secondary | ICD-10-CM | POA: Diagnosis not present

## 2021-03-07 DIAGNOSIS — I130 Hypertensive heart and chronic kidney disease with heart failure and stage 1 through stage 4 chronic kidney disease, or unspecified chronic kidney disease: Secondary | ICD-10-CM | POA: Diagnosis not present

## 2021-03-07 DIAGNOSIS — J9601 Acute respiratory failure with hypoxia: Secondary | ICD-10-CM | POA: Diagnosis not present

## 2021-03-07 DIAGNOSIS — N1831 Chronic kidney disease, stage 3a: Secondary | ICD-10-CM | POA: Diagnosis not present

## 2021-03-07 DIAGNOSIS — J101 Influenza due to other identified influenza virus with other respiratory manifestations: Secondary | ICD-10-CM | POA: Diagnosis not present

## 2021-03-07 NOTE — Telephone Encounter (Signed)
Scheduled home visit for 04/07/20 at 1 PM per patient request to help with medication organization.

## 2021-03-07 NOTE — Telephone Encounter (Signed)
My assistant, Shanon Brow, spoke with patient today to verify if she is still having trouble getting Trulicity 4.5 mg from pharmacy (per PCP request). Pt reports she picked up yesterday without issues. No further action needed.

## 2021-03-07 NOTE — Telephone Encounter (Signed)
Contacted patient to see if BG are trending downward. She finished prednisone last week. She reports she is trying to get back on schedule with everything. States she needs someone to come and sit down and tell her what time to take her medications. She is taking everything at two different times a day but this is hard on her stomach. She uses pill packets for some but not all of her medications. She reports BG have fluctuated. She is unable to provide log today. Reviewed BG goal of < 200 (this is goal for now - will work towards lower goal). She did received Trulicity 4.5 mg and resumed last week 02/28/21. Asked her to call if any future issues with backorder.

## 2021-03-08 DIAGNOSIS — E782 Mixed hyperlipidemia: Secondary | ICD-10-CM | POA: Diagnosis not present

## 2021-03-08 DIAGNOSIS — J449 Chronic obstructive pulmonary disease, unspecified: Secondary | ICD-10-CM | POA: Diagnosis not present

## 2021-03-08 DIAGNOSIS — E1165 Type 2 diabetes mellitus with hyperglycemia: Secondary | ICD-10-CM

## 2021-03-11 ENCOUNTER — Other Ambulatory Visit: Payer: Self-pay

## 2021-03-11 ENCOUNTER — Ambulatory Visit (INDEPENDENT_AMBULATORY_CARE_PROVIDER_SITE_OTHER): Payer: Medicare HMO | Admitting: Family Medicine

## 2021-03-11 ENCOUNTER — Encounter: Payer: Self-pay | Admitting: Family Medicine

## 2021-03-11 DIAGNOSIS — E119 Type 2 diabetes mellitus without complications: Secondary | ICD-10-CM

## 2021-03-11 DIAGNOSIS — J449 Chronic obstructive pulmonary disease, unspecified: Secondary | ICD-10-CM

## 2021-03-11 DIAGNOSIS — G720 Drug-induced myopathy: Secondary | ICD-10-CM | POA: Diagnosis not present

## 2021-03-11 NOTE — Progress Notes (Addendum)
This visit occurred during the SARS-CoV-2 public health emergency.  Safety protocols were in place, including screening questions prior to the visit, additional usage of staff PPE, and extensive cleaning of exam room while observing appropriate contact time as indicated for disinfecting solutions.  Diabetes:  Using medications without difficulties: yes, still on insulin, recently restarted trulicity.  Off prednisone now.   Hypoglycemic episodes: no Hyperglycemic episodes: no Feet problems: "my feet are doing pretty good."   Blood Sugars averaging: sugar 280-300 recently.  eye exam within last year: due, d/w pt.   She was able to get trulicity.  She prev got shorted on her rx but has a supply now.   Lower appetite noted recently, worse since recent illness.    Statin intolerance d/w pt.  Would defer starting other agents at this point given other concerns.    Most recent CXR with bronchitic changes.  Still on 2L O2.  She is still SOB with exertion.  Weight down 6 lbs from last OV.  She took metolazone most recently on 02/27/21.  She thought it helped with SOB prev.  Cough is clearly better.  No fevers, one episodes of sputum clearance but no sputum since.    Meds, vitals, and allergies reviewed.   ROS: Per HPI unless specifically indicated in ROS section   GEN: nad, alert and oriented HEENT: ncat NECK: supple w/o LA CV: rrr. PULM: ctab, no inc wob ABD: soft, +bs EXT: trace (almost no bilateral lower ext) edema SKIN: no acute rash On O2 via [place].  In wheelchair.

## 2021-03-11 NOTE — Patient Instructions (Addendum)
If your AM sugar is above 150, then add 2 units of insulin per day.   If your sugar is below 150, then cut back 2 units per day.   If you have more ankle swelling take 1 dose of metolazone.  Update me about your breathing and sugar next week.  Take care.  Glad to see you.  100---->102---->104---->106---->108

## 2021-03-12 DIAGNOSIS — N1831 Chronic kidney disease, stage 3a: Secondary | ICD-10-CM | POA: Diagnosis not present

## 2021-03-12 DIAGNOSIS — N390 Urinary tract infection, site not specified: Secondary | ICD-10-CM | POA: Diagnosis not present

## 2021-03-12 DIAGNOSIS — E1122 Type 2 diabetes mellitus with diabetic chronic kidney disease: Secondary | ICD-10-CM | POA: Diagnosis not present

## 2021-03-12 DIAGNOSIS — I5032 Chronic diastolic (congestive) heart failure: Secondary | ICD-10-CM | POA: Diagnosis not present

## 2021-03-12 DIAGNOSIS — J441 Chronic obstructive pulmonary disease with (acute) exacerbation: Secondary | ICD-10-CM | POA: Diagnosis not present

## 2021-03-12 DIAGNOSIS — J9601 Acute respiratory failure with hypoxia: Secondary | ICD-10-CM | POA: Diagnosis not present

## 2021-03-12 DIAGNOSIS — D631 Anemia in chronic kidney disease: Secondary | ICD-10-CM | POA: Diagnosis not present

## 2021-03-12 DIAGNOSIS — J101 Influenza due to other identified influenza virus with other respiratory manifestations: Secondary | ICD-10-CM | POA: Diagnosis not present

## 2021-03-12 DIAGNOSIS — I130 Hypertensive heart and chronic kidney disease with heart failure and stage 1 through stage 4 chronic kidney disease, or unspecified chronic kidney disease: Secondary | ICD-10-CM | POA: Diagnosis not present

## 2021-03-12 DIAGNOSIS — J45901 Unspecified asthma with (acute) exacerbation: Secondary | ICD-10-CM | POA: Diagnosis not present

## 2021-03-12 NOTE — Assessment & Plan Note (Signed)
She does not appear fluid overloaded but that could contribute to her dyspnea/cough.  We talked about options, if more ankle swelling take 1 dose of metolazone.  Lungs are clear at time of exam.  I want her to update me about her swelling/metolazone use/breathing next week.  She agrees with plan.  She does appear to be improved.

## 2021-03-12 NOTE — Assessment & Plan Note (Signed)
Discussed options in the short-term, especially increasing her insulin. If AM sugar is above 150, then add 2 units of insulin per day.   If sugar is below 150, then cut back 2 units per day.   She can update me about her sugar readings next week.  I expect her sugar to improve now that she is back on Trulicity and off prednisone.

## 2021-03-13 ENCOUNTER — Other Ambulatory Visit: Payer: Self-pay | Admitting: Family Medicine

## 2021-03-13 NOTE — Assessment & Plan Note (Signed)
Statin intolerance d/w pt.  Would defer starting other agents at this point given other concerns.

## 2021-03-17 DIAGNOSIS — N1831 Chronic kidney disease, stage 3a: Secondary | ICD-10-CM | POA: Diagnosis not present

## 2021-03-17 DIAGNOSIS — J441 Chronic obstructive pulmonary disease with (acute) exacerbation: Secondary | ICD-10-CM | POA: Diagnosis not present

## 2021-03-17 DIAGNOSIS — I5032 Chronic diastolic (congestive) heart failure: Secondary | ICD-10-CM | POA: Diagnosis not present

## 2021-03-17 DIAGNOSIS — J9601 Acute respiratory failure with hypoxia: Secondary | ICD-10-CM | POA: Diagnosis not present

## 2021-03-17 DIAGNOSIS — J45901 Unspecified asthma with (acute) exacerbation: Secondary | ICD-10-CM | POA: Diagnosis not present

## 2021-03-17 DIAGNOSIS — J101 Influenza due to other identified influenza virus with other respiratory manifestations: Secondary | ICD-10-CM | POA: Diagnosis not present

## 2021-03-17 DIAGNOSIS — N390 Urinary tract infection, site not specified: Secondary | ICD-10-CM | POA: Diagnosis not present

## 2021-03-17 DIAGNOSIS — D631 Anemia in chronic kidney disease: Secondary | ICD-10-CM | POA: Diagnosis not present

## 2021-03-17 DIAGNOSIS — E1122 Type 2 diabetes mellitus with diabetic chronic kidney disease: Secondary | ICD-10-CM | POA: Diagnosis not present

## 2021-03-17 DIAGNOSIS — I130 Hypertensive heart and chronic kidney disease with heart failure and stage 1 through stage 4 chronic kidney disease, or unspecified chronic kidney disease: Secondary | ICD-10-CM | POA: Diagnosis not present

## 2021-03-19 DIAGNOSIS — G4733 Obstructive sleep apnea (adult) (pediatric): Secondary | ICD-10-CM | POA: Diagnosis not present

## 2021-03-21 DIAGNOSIS — J449 Chronic obstructive pulmonary disease, unspecified: Secondary | ICD-10-CM | POA: Diagnosis not present

## 2021-03-25 ENCOUNTER — Ambulatory Visit: Payer: Medicare HMO | Admitting: Physician Assistant

## 2021-03-25 DIAGNOSIS — I5032 Chronic diastolic (congestive) heart failure: Secondary | ICD-10-CM | POA: Diagnosis not present

## 2021-03-25 DIAGNOSIS — J101 Influenza due to other identified influenza virus with other respiratory manifestations: Secondary | ICD-10-CM | POA: Diagnosis not present

## 2021-03-25 DIAGNOSIS — J441 Chronic obstructive pulmonary disease with (acute) exacerbation: Secondary | ICD-10-CM | POA: Diagnosis not present

## 2021-03-25 DIAGNOSIS — D631 Anemia in chronic kidney disease: Secondary | ICD-10-CM | POA: Diagnosis not present

## 2021-03-25 DIAGNOSIS — J45901 Unspecified asthma with (acute) exacerbation: Secondary | ICD-10-CM | POA: Diagnosis not present

## 2021-03-25 DIAGNOSIS — N1831 Chronic kidney disease, stage 3a: Secondary | ICD-10-CM | POA: Diagnosis not present

## 2021-03-25 DIAGNOSIS — I130 Hypertensive heart and chronic kidney disease with heart failure and stage 1 through stage 4 chronic kidney disease, or unspecified chronic kidney disease: Secondary | ICD-10-CM | POA: Diagnosis not present

## 2021-03-25 DIAGNOSIS — E1122 Type 2 diabetes mellitus with diabetic chronic kidney disease: Secondary | ICD-10-CM | POA: Diagnosis not present

## 2021-03-25 DIAGNOSIS — J9601 Acute respiratory failure with hypoxia: Secondary | ICD-10-CM | POA: Diagnosis not present

## 2021-03-25 DIAGNOSIS — N390 Urinary tract infection, site not specified: Secondary | ICD-10-CM | POA: Diagnosis not present

## 2021-03-28 ENCOUNTER — Other Ambulatory Visit: Payer: Self-pay | Admitting: Family Medicine

## 2021-03-31 ENCOUNTER — Telehealth: Payer: Self-pay

## 2021-03-31 ENCOUNTER — Emergency Department: Payer: Medicare HMO

## 2021-03-31 ENCOUNTER — Emergency Department
Admission: EM | Admit: 2021-03-31 | Discharge: 2021-03-31 | Disposition: A | Discharge status: Home / Self Care | Payer: Medicare HMO | Attending: Emergency Medicine | Admitting: Emergency Medicine

## 2021-03-31 ENCOUNTER — Other Ambulatory Visit: Payer: Self-pay

## 2021-03-31 DIAGNOSIS — I5030 Unspecified diastolic (congestive) heart failure: Secondary | ICD-10-CM | POA: Insufficient documentation

## 2021-03-31 DIAGNOSIS — J101 Influenza due to other identified influenza virus with other respiratory manifestations: Secondary | ICD-10-CM | POA: Diagnosis not present

## 2021-03-31 DIAGNOSIS — J441 Chronic obstructive pulmonary disease with (acute) exacerbation: Secondary | ICD-10-CM | POA: Diagnosis not present

## 2021-03-31 DIAGNOSIS — R0789 Other chest pain: Secondary | ICD-10-CM

## 2021-03-31 DIAGNOSIS — I5032 Chronic diastolic (congestive) heart failure: Secondary | ICD-10-CM | POA: Diagnosis not present

## 2021-03-31 DIAGNOSIS — J449 Chronic obstructive pulmonary disease, unspecified: Secondary | ICD-10-CM | POA: Insufficient documentation

## 2021-03-31 DIAGNOSIS — R079 Chest pain, unspecified: Secondary | ICD-10-CM | POA: Diagnosis not present

## 2021-03-31 DIAGNOSIS — K2100 Gastro-esophageal reflux disease with esophagitis, without bleeding: Secondary | ICD-10-CM | POA: Diagnosis not present

## 2021-03-31 DIAGNOSIS — J45901 Unspecified asthma with (acute) exacerbation: Secondary | ICD-10-CM | POA: Diagnosis not present

## 2021-03-31 DIAGNOSIS — I130 Hypertensive heart and chronic kidney disease with heart failure and stage 1 through stage 4 chronic kidney disease, or unspecified chronic kidney disease: Secondary | ICD-10-CM | POA: Insufficient documentation

## 2021-03-31 DIAGNOSIS — N1831 Chronic kidney disease, stage 3a: Secondary | ICD-10-CM | POA: Diagnosis not present

## 2021-03-31 DIAGNOSIS — N390 Urinary tract infection, site not specified: Secondary | ICD-10-CM | POA: Diagnosis not present

## 2021-03-31 DIAGNOSIS — N189 Chronic kidney disease, unspecified: Secondary | ICD-10-CM | POA: Insufficient documentation

## 2021-03-31 DIAGNOSIS — R110 Nausea: Secondary | ICD-10-CM | POA: Diagnosis not present

## 2021-03-31 DIAGNOSIS — D631 Anemia in chronic kidney disease: Secondary | ICD-10-CM | POA: Diagnosis not present

## 2021-03-31 DIAGNOSIS — E1122 Type 2 diabetes mellitus with diabetic chronic kidney disease: Secondary | ICD-10-CM | POA: Diagnosis not present

## 2021-03-31 DIAGNOSIS — J9601 Acute respiratory failure with hypoxia: Secondary | ICD-10-CM | POA: Diagnosis not present

## 2021-03-31 DIAGNOSIS — J9811 Atelectasis: Secondary | ICD-10-CM | POA: Diagnosis not present

## 2021-03-31 DIAGNOSIS — Z743 Need for continuous supervision: Secondary | ICD-10-CM | POA: Diagnosis not present

## 2021-03-31 LAB — TROPONIN I (HIGH SENSITIVITY)
Troponin I (High Sensitivity): 9 ng/L (ref <18)
Troponin I (High Sensitivity): 8 ng/L (ref <18)

## 2021-03-31 LAB — BASIC METABOLIC PANEL
Anion gap: 10 (ref 5–15)
BUN: 20 mg/dL (ref 8–23)
CO2: 34 mmol/L — ABNORMAL HIGH (ref 22–32)
Calcium: 10 mg/dL (ref 8.9–10.3)
Chloride: 93 mmol/L — ABNORMAL LOW (ref 98–111)
Creatinine, Ser: 1.24 mg/dL — ABNORMAL HIGH (ref 0.44–1.00)
GFR, Estimated: 48 mL/min — ABNORMAL LOW (ref >60)
Glucose, Bld: 210 mg/dL — ABNORMAL HIGH (ref 70–99)
Potassium: 3.8 mmol/L (ref 3.5–5.1)
Sodium: 137 mmol/L (ref 135–145)

## 2021-03-31 LAB — LIPASE, BLOOD: Lipase: 27 U/L (ref 11–51)

## 2021-03-31 LAB — HEPATIC FUNCTION PANEL
ALT: 16 U/L (ref 0–44)
AST: 24 U/L (ref 15–41)
Albumin: 3.5 g/dL (ref 3.5–5.0)
Alkaline Phosphatase: 88 U/L (ref 38–126)
Bilirubin, Direct: 0.4 mg/dL — ABNORMAL HIGH (ref 0.0–0.2)
Indirect Bilirubin: 1 mg/dL — ABNORMAL HIGH (ref 0.3–0.9)
Total Bilirubin: 1.4 mg/dL — ABNORMAL HIGH (ref 0.3–1.2)
Total Protein: 7.3 g/dL (ref 6.5–8.1)

## 2021-03-31 LAB — CBC
HCT: 41.6 % (ref 36.0–46.0)
Hemoglobin: 13.6 g/dL (ref 12.0–15.0)
MCH: 30 pg (ref 26.0–34.0)
MCHC: 32.7 g/dL (ref 30.0–36.0)
MCV: 91.8 fL (ref 80.0–100.0)
Platelets: 328 10*3/uL (ref 150–400)
RBC: 4.53 MIL/uL (ref 3.87–5.11)
RDW: 14.3 % (ref 11.5–15.5)
WBC: 9.7 10*3/uL (ref 4.0–10.5)
nRBC: 0 % (ref 0.0–0.2)

## 2021-03-31 NOTE — ED Provider Triage Note (Signed)
Emergency Medicine Provider Triage Evaluation Note  VYOLET SAKUMA, a 66 y.o. female  was evaluated in triage.  Pt complains of epigastric abdominal pain and central chest pressure. Symptoms have been intermittent since her diagnosis of influenza, 3 weeks prior. She denies any associated vomiting, diarrhea, or SOB. She is s/p TAH, appy, cholecystectomy, and sigmoidectomy.   Review of Systems  Positive: Epigastric abd pain, central chest pressure Negative: Vomiting, diarrhea  Physical Exam  BP 132/83    Pulse 78    Temp 98.3 F (36.8 C) (Oral)    Resp 20    Ht 5' 5"  (1.651 m)    Wt 118 kg    SpO2 97%    BMI 43.29 kg/m  Gen:   Awake, no distress   Resp:  Normal effort  MSK:   Moves extremities without difficulty  Other:  ABD: soft, nontender  Medical Decision Making  Medically screening exam initiated at 6:08 PM.  Appropriate orders placed.  YANIN MUHLESTEIN was informed that the remainder of the evaluation will be completed by another provider, this initial triage assessment does not replace that evaluation, and the importance of remaining in the ED until their evaluation is complete.  Patient with ED evaluation of 3 weeks of intermittent epigastric abdominal pain and central chest pressure.   Melvenia Needles, PA-C 03/31/21 1814

## 2021-03-31 NOTE — ED Triage Notes (Signed)
Arrives via intermittent epigastric pain x several weeks.  Has appointment with PCP tomorrow, who referred patient to ED.  Wears 2L/ .  Vs wnl.

## 2021-03-31 NOTE — ED Provider Notes (Signed)
[facility] Provider Note    Event Date/Time   First MD Initiated Contact with Patient 03/31/21 1932     (approximate)   History   Chief Complaint Chest Pain   HPI  Kathleen Hatfield is a 66 y.o. female with past medical history of hypertension, hyperlipidemia, diabetes, CKD, COPD on 2 L nasal cannula, diastolic CHF, and ulcerative colitis who presents to the ED complaining of chest pain.  Patient reports that she has had 3 to 4 days of pain starting in her epigastrium and moving upwards into her chest.  She describes the pain as intermittent and feeling like a pressure, not exacerbated or alleviated by anything in particular.  She denies any associated fevers, cough, shortness of breath, pain or swelling in her legs.  She states she has felt nauseous at times but denies any vomiting and has not had any changes in her bowel movements.  She denies any dysuria, hematuria, or flank pain.  She reports a history of GERD, currently takes Protonix once daily.  She is status postcholecystectomy.     Physical Exam   Triage Vital Signs: ED Triage Vitals  Enc Vitals Group     BP 03/31/21 1804 132/83     Pulse Rate 03/31/21 1804 78     Resp 03/31/21 1804 20     Temp 03/31/21 1804 98.3 F (36.8 C)     Temp Source 03/31/21 1804 Oral     SpO2 03/31/21 1804 97 %     Weight 03/31/21 1805 260 lb 2.3 oz (118 kg)     Height 03/31/21 1805 5' 5"  (1.651 m)     Head Circumference --      Peak Flow --      Pain Score 03/31/21 1805 0     Pain Loc --      Pain Edu? --      Excl. in [place]? --     Most recent vital signs: Vitals:   03/31/21 1804 03/31/21 1935  BP: 132/83 136/67  Pulse: 78 81  Resp: 20 16  Temp: 98.3 F (36.8 C)   SpO2: 97% 96%    Constitutional: Alert and oriented. Eyes: Conjunctivae are normal. Head: Atraumatic. Nose: No congestion/rhinnorhea. Mouth/Throat: Mucous membranes are moist.  Cardiovascular: Normal rate, regular rhythm. Grossly normal heart  sounds.  2+ radial pulses bilaterally. Respiratory: Normal respiratory effort.  No retractions. Lungs CTAB. Gastrointestinal: Soft and mildly tender to palpation in the epigastrium with no rebound or guarding. No distention. Musculoskeletal: No lower extremity tenderness nor edema.  Neurologic:  Normal speech and language. No gross focal neurologic deficits are appreciated.    ED Results / Procedures / Treatments   Labs (all labs ordered are listed, but only abnormal results are displayed) Labs Reviewed  BASIC METABOLIC PANEL - Abnormal; Notable for the following components:      Result Value   Chloride 93 (*)    CO2 34 (*)    Glucose, Bld 210 (*)    Creatinine, Ser 1.24 (*)    GFR, Estimated 48 (*)    All other components within normal limits  HEPATIC FUNCTION PANEL - Abnormal; Notable for the following components:   Total Bilirubin 1.4 (*)    Bilirubin, Direct 0.4 (*)    Indirect Bilirubin 1.0 (*)    All other components within normal limits  CBC  LIPASE, BLOOD  TROPONIN I (HIGH SENSITIVITY)  TROPONIN I (HIGH SENSITIVITY)     EKG  ED ECG REPORT  Tempie Hoist, the attending physician, personally viewed and interpreted this ECG.   Date: 03/31/2021  EKG Time: 18:09  Rate: 79  Rhythm: normal sinus rhythm  Axis: Normal  Intervals:left anterior fascicular block and Incomplete RBBB  ST&T Change: None  RADIOLOGY Chest x-ray reviewed by me with no infiltrate, edema, or effusion.  PROCEDURES:  Critical Care performed: No  Procedures   MEDICATIONS ORDERED IN ED: Medications - No data to display   IMPRESSION / MDM / [place] / ED COURSE  I reviewed the triage vital signs and the nursing notes.                              66 y.o. female with past medical history of hypertension, hyperlipidemia, diabetes, CKD, COPD on 2 L, diastolic CHF, and ulcerative colitis who presents to the ED complaining of pain starting in upper epigastrium and moving  upwards into her chest intermittently over the past 3 to 4 days.  Differential diagnosis includes, but is not limited to, ACS, PE, pneumonia, pneumothorax, pancreatitis, hepatitis, GERD.  Patient nontoxic-appearing and in no acute distress, vital signs are reassuring.  EKG shows no evidence of arrhythmia or ischemia and initial troponin is negative, low suspicion for ACS given her atypical symptoms.  Symptoms sound more consistent with GERD, CBC and BMP show no anemia or electrolyte abnormality.  She is status postcholecystectomy, we will add on LFTs and lipase.  Patient states she is currently asymptomatic and if remainder of labs are unremarkable, she would be appropriate for outpatient management and increase of her Protonix dose to twice daily.  Repeat troponin within normal limits, LFTs and lipase are also unremarkable.  Patient continues to deny any ongoing symptoms on my reassessment, maintaining O2 sats on her usual 2 L nasal cannula.  She is appropriate for outpatient follow-up with her PCP, was counseled to increase her Protonix dose to twice daily and take Tums and Pepcid as needed.  She was counseled to return to the ED for new worsening symptoms, patient agrees with plan.       FINAL CLINICAL IMPRESSION(S) / ED DIAGNOSES   Final diagnoses:  Atypical chest pain  Gastroesophageal reflux disease with esophagitis without hemorrhage     Rx / DC Orders   ED Discharge Orders     None        Note:  This document was prepared using Dragon voice recognition software and may include unintentional dictation errors.   Blake Divine, MD 03/31/21 2113

## 2021-03-31 NOTE — Chronic Care Management (AMB) (Signed)
Chronic Care Management Pharmacy Assistant   Name: LENDA BARATTA  MRN: [id number] DOB: 1955/03/15   Reason for Encounter: Reminder Call   Conditions to be addressed/monitored: HTN, HLD, COPD, and DMII   Medications: Outpatient Encounter Medications as of 03/31/2021  Medication Sig   acetaminophen (TYLENOL) 500 MG tablet Take 500 mg by mouth every 6 (six) hours as needed for moderate pain.   albuterol (VENTOLIN HFA) 108 (90 Base) MCG/ACT inhaler Inhale 2 puffs into the lungs every 6 (six) hours as needed.   BAYER CONTOUR NEXT TEST test strip 1 each by Other route daily. As directed   budesonide (PULMICORT) 0.5 MG/2ML nebulizer solution Take 2 mLs (0.5 mg total) by nebulization 2 (two) times daily.   buPROPion (WELLBUTRIN XL) 300 MG 24 hr tablet TAKE 1 TABLET BY MOUTH DAILY   Cholecalciferol (VITAMIN D-3) 5000 UNITS TABS Take 5,000 Units by mouth every other day.    colchicine 0.6 MG tablet Take 1 tablet (0.6 mg total) by mouth daily as needed (for gout).   dicyclomine (BENTYL) 20 MG tablet TAKE 1 TABLET UP TO FOUR TIMES A DAY FOR GI CRAMPING, PAIN, NAUSEA, AND VOMITING AS NEEDED   Dulaglutide (TRULICITY) 4.5 AS/3.4HD SOPN Inject 4.5 mg as directed once a week.   DULoxetine (CYMBALTA) 60 MG capsule TAKE 1 CAPSULE BY MOUTH ONCE DAILY.   fluticasone (FLONASE) 50 MCG/ACT nasal spray Place 2 sprays into both nostrils daily.   furosemide (LASIX) 20 MG tablet Take 3 tablets (60 mg total) by mouth 2 (two) times daily.   gabapentin (NEURONTIN) 100 MG capsule TAKE 3 CAPSULES BY MOUTH AT BEDTIME.   GLOBAL EASE INJECT PEN NEEDLES 32G X 4 MM MISC USE AS DIRECTED WITH VICTOZA AND TRESIBA.   ipratropium-albuterol (DUONEB) 0.5-2.5 (3) MG/3ML SOLN Take 3 mLs by nebulization every 6 (six) hours as needed (wheezing).   Iron, Ferrous Sulfate, 325 (65 Fe) MG TABS Take 325 mg by mouth every other day.   metolazone (ZAROXOLYN) 5 MG tablet TAKE 1 TABLET BY MOUTH AS NEEDED.   MICROLET LANCETS MISC 1 each by  Other route. As directed   nystatin (MYCOSTATIN/NYSTOP) powder Apply 1 application topically 3 (three) times daily.   ondansetron (ZOFRAN) 4 MG tablet Take 1 tablet (4 mg total) by mouth every 6 (six) hours. (Patient taking differently: Take 4 mg by mouth every 8 (eight) hours as needed for vomiting or nausea.)   oxyCODONE-acetaminophen (PERCOCET/ROXICET) 5-325 MG tablet Take 1 tablet by mouth every 6 (six) hours as needed for severe pain.   pantoprazole (PROTONIX) 40 MG tablet TAKE 1 TABLET BY MOUTH AT BEDTIME   polyethylene glycol (MIRALAX / GLYCOLAX) packet Take 17 g by mouth daily as needed for mild constipation.   potassium chloride (MICRO-K) 10 MEQ CR capsule TAKE 5 CAPSULES (50 MEQ TOTAL) BY MOUTH 2 (TWO) TIMES DAILY   promethazine-dextromethorphan (PROMETHAZINE-DM) 6.25-15 MG/5ML syrup Take 2.5 mLs by mouth 4 (four) times daily as needed for cough (sedation caution).   propranolol (INDERAL) 10 MG tablet TAKE 1 TABLET BY MOUTH 2 TIMES DAILY   revefenacin (YUPELRI) 175 MCG/3ML nebulizer solution Take 3 mLs (175 mcg total) by nebulization daily.   rOPINIRole (REQUIP) 4 MG tablet TAKE 1 TABLET BY MOUTH TWICE DAILY   spironolactone (ALDACTONE) 25 MG tablet TAKE 1 TABLET BY MOUTH DAILY   traMADol (ULTRAM) 50 MG tablet Take 1 tablet (50 mg total) by mouth 3 (three) times daily as needed.   TRESIBA FLEXTOUCH 200 UNIT/ML FlexTouch Pen  INJECT 90 UNITS UNDER SKIN ONCE DAILY   UNABLE TO FIND CPAP- At bedtime   No facility-administered encounter medications on file as of 03/31/2021.   QUANTISHA MARSICANO was contacted to remind of upcoming office visit with Debbora Dus on 04/07/21 at 1:00pm Patient was reminded to have all medications, supplements and any blood glucose and blood pressure readings available for review at appointment. If unable to reach, a voicemail was left for patient.    Are you having any problems with your medications? The patient reports needing help with organization of medications.  She will have a large pill box for the appointment   Do you have any concerns you like to discuss with the pharmacist? No    Star Rating Drugs: Medication:  Last Fill: Day Supply Trulicity 6.6QH  47/65/46 30 Tresiba 200 unit/ml 03/14/21  [place], CPP notified  [name], [place] Assistant [phone]  Total time spent for month CPA: 10 min.

## 2021-03-31 NOTE — ED Triage Notes (Signed)
Pt to ED [facility] for intermittent epigastric pressure radiating up since having the flu a month ago. Reports wears 2 L [place] chronic since flu. States PCP advised her to come. NAD noted. Speaking in complete sentences. RR even and unlabored.

## 2021-04-01 ENCOUNTER — Telehealth: Payer: Self-pay

## 2021-04-01 NOTE — Telephone Encounter (Signed)
Kathleen Hatfield - Client TELEPHONE ADVICE RECORD [facility] Patient Name: Kathleen Hatfield Gender: Female DOB: Jan 25, 1956 Age: 66 Y 2 M 17 D Return Phone Number: [phone] (Primary), [phone] (Secondary) Address: City/ State/ Zip: [place]  [address] Client Kathleen Hatfield - Client Client Site [place] Provider Kathleen Hatfield - MD Contact Type Call Who Is Calling Patient / Member / Family / Caregiver Call Type Triage / Clinical Caller Name Kathleen Hatfield [facility]. Relationship To Patient Provider Return Phone Number [phone] (Primary) Chief Complaint CHEST PAIN - pain, pressure, heaviness or tightness Reason for Call Symptomatic / Request for Health Information Initial Comment Pt is having pressure and pain in chest w/ nausea and vomiting. 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 Translation No Nurse Assessment Nurse: Kathleen Blackwater, RN, Kathleen Hatfield Date/Time Kathleen Hatfield Time): 03/31/2021 4:51:58 PM Confirm and document reason for call. If symptomatic, describe symptoms. ---Pt states she has chest pressure that she has had for a couple weeks, with little appetite and nausea. Pt denies shortness of breath beyond her normal state. Does the patient have any new or worsening symptoms? ---Yes Will a triage be completed? ---Yes Related visit to physician within the last 2 weeks? ---Yes Does the PT have any chronic conditions? (i.e. diabetes, asthma, this includes High risk factors for pregnancy, etc.) ---Yes List chronic conditions. ---Asthma, COPD, CHF, Diabetes Is this a behavioral health or substance abuse call? ---No Guidelines Guideline Title Affirmed Question Affirmed Notes Nurse Date/Time Kathleen Hatfield Time) Chest Pain [1] Chest pain lasts > 5 minutes AND [2] age > 93 Kathleen Hatfield 03/31/2021 4:54:55 PM Disp. Time Kathleen Hatfield Time)  Disposition Final User 03/31/2021 4:49:49 PM Send to Urgent Queue Kathleen Hatfield PLEASE NOTE: All timestamps contained within this report are represented as [place] Standard Time. CONFIDENTIALTY NOTICE: This fax transmission is intended only for the addressee. It contains information that is legally privileged, confidential or otherwise protected from use or disclosure. If you are not the intended recipient, you are strictly prohibited from reviewing, disclosing, copying using or disseminating any of this information or taking any action in reliance on or regarding this information. If you have received this fax in error, please notify us immediately by telephone so that we can arrange for its return to [place]. Phone: [phone], Toll-Free: [phone], Fax: [phone] Page: 2 of 2 Call Id: [id number] Kathleen Hatfield. Time Kathleen Hatfield Time) Disposition Final User 03/31/2021 5:03:56 PM 911 Outcome Documentation Kathleen Blackwater, RN, Kathleen Hatfield Reason: Kathleen Hatfield has not yet called, but she is going to call 911 now. 03/31/2021 4:56:39 PM Call EMS 911 Now Yes Kathleen Blackwater, RN, Kathleen Hatfield Disagree/Comply Comply Caller Understands Yes PreDisposition Call Doctor Care Advice Given Per Guideline CALL EMS 911 NOW: NOTE TO TRIAGER - IF CALLER ASKS ABOUT ASPIRIN: * Call EMS 911 first. CARE ADVICE given per Chest Pain (Adult) guideline

## 2021-04-01 NOTE — Telephone Encounter (Signed)
Noted. Please check on patient later this week and update me.  Thanks.

## 2021-04-01 NOTE — Telephone Encounter (Signed)
Per chart review tab pt was seen at [facility] ED on 03/31/21. Sending note to Dr [name] and Janett Billow CMA.

## 2021-04-03 NOTE — Telephone Encounter (Signed)
Patient states she is doing better since been at ER. Patient states the sensation is still there in her diaphragm area and some days a little more pronounced discomfort. Patient states what got this started was she was trying a nutrient OTC remedy for gout and it helps gout but she thinks some of the ingredients did not agree with her. No vomiting. Some nausea still present. Patient states she did not try to increase Protonix dose to twice daily as recommended at the ER, she forgot, and to take Tums and Pepcid as needed. Patient verbalized understanding to try what was suggested and let us know next week if she is not better. Patient was not in distress. FYI to Dr [name].

## 2021-04-03 NOTE — Telephone Encounter (Signed)
Noted. Thanks.

## 2021-04-07 ENCOUNTER — Other Ambulatory Visit: Payer: Self-pay

## 2021-04-07 ENCOUNTER — Ambulatory Visit (INDEPENDENT_AMBULATORY_CARE_PROVIDER_SITE_OTHER): Payer: Medicare HMO

## 2021-04-07 DIAGNOSIS — I10 Essential (primary) hypertension: Secondary | ICD-10-CM

## 2021-04-07 DIAGNOSIS — I5032 Chronic diastolic (congestive) heart failure: Secondary | ICD-10-CM

## 2021-04-07 DIAGNOSIS — E1165 Type 2 diabetes mellitus with hyperglycemia: Secondary | ICD-10-CM

## 2021-04-07 DIAGNOSIS — R5382 Chronic fatigue, unspecified: Secondary | ICD-10-CM

## 2021-04-07 DIAGNOSIS — J449 Chronic obstructive pulmonary disease, unspecified: Secondary | ICD-10-CM

## 2021-04-07 NOTE — Progress Notes (Signed)
Chronic Care Management Pharmacy Note  04/07/2021 Name:  Kathleen Hatfield MRN:  [id number] DOB:  03/12/1955  Summary: CCM home visit today. Pt requested visit to assist with timing of administration. Primary concern was preference for spreading out medications throughout day and limiting medications. Most are in the pill packs, except furosemide, potassium, ferrous sulfate, mesalamine, and injections Kathleen Hatfield, Trulicity). She has a hard time taking the potassium due to quantity of pills. We discussed dividing potassium into 3 doses per day and adding to pill packs. She will d/c OTCs (gasX, vitamin E) to reduce pill burden. Plan to switch to [facility] so I can assist with these changes.   Recommendations:  Simplify medication admin times and utilize [facility] pill packs Sent new rx for the following: Tresiba to [facility] - 108 units daily, Iron (QOD versus daily), and Trulicity 4.5 mg ([facility] 3 mg dose)  Plan: CCM PharmD visit 6 weeks   Subjective: Kathleen Hatfield is an 66 y.o. year old female who is a primary patient of [name], [name], MD.  The CCM team was consulted for assistance with disease management and care coordination needs.    Engaged with patient by telephone for follow up visit in response to provider referral for pharmacy case management and/or care coordination services.   Consent to Services:  The patient was given information about Chronic Care Management services, agreed to services, and gave verbal consent prior to initiation of services.  Please see initial visit note for detailed documentation.   Patient Care Team: [name], MD as PCP - General (Family Medicine) [name], MD as PCP - Cardiology (Cardiology) [name], MD as Consulting Physician (Pulmonary Disease) [name], MD as Consulting Physician (General Surgery) [place], [name], MD as Consulting Physician (Pulmonary Disease) Debbora Dus, [facility] as Pharmacist (Pharmacist) Dannielle Karvonen, RN as Case Manager  Recent office visits:  03/11/21 - [name], MD, PCP - Pt presented for diabetes follow up. Increase insulin by 2 units/day for BP > 150. If you have more ankle swelling take 1 dose of metolazone.  Update me about your breathing and sugar next week.  02/27/21 - [name], MD, PCP - Pt presented for hospital follow up. Kathleen Hatfield  She was admitted with flu with hyperglycemia exacerbated by steroid use.  Improved in the meantime.  Discussed taking 1 dose of metolazone to see if that helped with her breathing and cough in the meantime.  She can use Tessalon for cough. 02/07/21 - Alma Friendly, NP - Pt presented for COPD exacerbation. Start doxycycline x 10 days and prednisone 40 mg x 5 days. Adjust insulin by 10 units for BG > 200.   11/28/20 - [name], MD, PCP - Pt presented for diabetes follow up and CHF. Stop meloxicam. Updated labs. A1c elevated, Referral to endocrinology. BNP normal, hold metolazone. Low iron, start supplementation.    Recent consult visits:  None since last CCM on 11/18/20   Hospital visits: 03/31/21 - ED visit, atypical chest pain 02/14/21 - 02/18/21 - Admission, COPD exacerbation  02/08/21 - ED, COPD exacerbation 12/12/20 - ED visit, acute gout  Objective:  Lab Results  Component Value Date   CREATININE 1.24 (H) 03/31/2021   BUN 20 03/31/2021   GFR 48.76 (L) 02/27/2021   GFRNONAA 48 (L) 03/31/2021   GFRAA 69 01/19/2020   NA 137 03/31/2021   K 3.8 03/31/2021   CALCIUM 10.0 03/31/2021   CO2 34 (H) 03/31/2021  GLUCOSE 210 (H) 03/31/2021    Lab Results  Component Value Date/Time   HGBA1C 10.9 (H) 02/14/2021 06:59 AM   HGBA1C 9.4 (H) 11/28/2020 03:56 PM   GFR 48.76 (L) 02/27/2021 12:16 PM   GFR 38.07 (L) 11/28/2020 03:56 PM    Last diabetic Eye exam:  Lab Results  Component Value Date/Time   HMDIABEYEEXA No Retinopathy 12/22/2019 12:00 AM    Last diabetic Foot exam: completed by PCP 11/28/2020 - normal    Lab Results   Component Value Date   CHOL 333 04/26/2019   HDL 34 (L) 03/25/2013   LDLCALC 133 (H) 03/25/2013   TRIG 469 (A) 04/26/2019   CHOLHDL 6.9 03/25/2013    Hepatic Function Latest Ref Rng & Units 03/31/2021 11/28/2020 09/20/2020  Total Protein 6.5 - 8.1 g/dL 7.3 7.0 7.2  Albumin 3.5 - 5.0 g/dL 3.5 3.8 3.3(L)  AST 15 - 41 U/L _0 ALT 0 - 44 U/L _1 Alk Phosphatase 38 - 126 U/L 88 92 83  Total Bilirubin 0.3 - 1.2 mg/dL 1.4(H) 0.3 0.4  Bilirubin, Direct 0.0 - 0.2 mg/dL 0.4(H) - -    Lab Results  Component Value Date/Time   TSH 2.850 06/09/2019 12:50 PM   TSH 2.31 04/15/2015 03:45 PM    CBC Latest Ref Rng & Units 03/31/2021 02/27/2021 02/16/2021  WBC 4.0 - 10.5 K/uL 9.7 10.6(H) 16.1(H)  Hemoglobin 12.0 - 15.0 g/dL 13.6 13.6 13.7  Hematocrit 36.0 - 46.0 % 41.6 41.9 42.3  Platelets 150 - 400 K/uL 328 360.0 259    Lab Results  Component Value Date/Time   VD25OH 45.52 06/18/2020 04:52 PM   VD25OH 47.60 06/22/2019 01:59 PM    Clinical ASCVD: No  The ASCVD Risk score (Arnett DK, et al., 2019) failed to calculate for the following reasons:   The valid total cholesterol range is 130 to 320 mg/dL    Depression screen [facility] 2/9 08/12/2020 12/28/2017 09/13/2017  Decreased Interest _2 Down, Depressed, Hopeless _3 PHQ - 2 Score _4 Altered sleeping _5 Tired, decreased energy _6 Change in appetite 2 0 1  Feeling bad or failure about yourself  _7 Trouble concentrating 3 0 3  Moving slowly or fidgety/restless _8 Suicidal thoughts 0 0 0  PHQ-9 Score _9 Difficult doing work/chores Somewhat difficult Very difficult Very difficult  Some recent data might be hidden    Social History   Tobacco Use  Smoking Status Never  Smokeless Tobacco Never   BP Readings from Last 3 Encounters:  03/31/21 118/65  03/11/21 120/84  02/27/21 (!) 142/80   Pulse Readings from Last 3 Encounters:  03/31/21 60  03/11/21 87  02/27/21 88   Wt Readings from Last 3  Encounters:  03/31/21 260 lb 2.3 oz (118 kg)  03/11/21 260 lb (117.9 kg)  02/27/21 266 lb (120.7 kg)   BMI Readings from Last 3 Encounters:  03/31/21 43.29 kg/m  03/11/21 43.27 kg/m  02/27/21 44.26 kg/m   Iron/TIBC/Ferritin/ %Sat    Component Value Date/Time   IRON 37 (L) 11/28/2020 1556   IRON 66 09/19/2019 0000   TIBC 430 09/19/2019 0000   FERRITIN 64.4 11/28/2020 1556   FERRITIN 50 09/19/2019 0000   IRONPCTSAT 15 09/19/2019 0000    Assessment/Interventions: Review of patient past medical history, allergies, medications, health status, including review of consultants reports, laboratory  and other test data, was performed as part of comprehensive evaluation and provision of chronic care management services.   SDOH:  (Social Determinants of Health) assessments and interventions performed: Yes  SDOH Screenings   Alcohol Screen: Not on file  Depression (PHQ2-9): Medium Risk   PHQ-2 Score: 20  Financial Resource Strain: Low Risk    Difficulty of Paying Living Expenses: Not very hard  Food Insecurity: No Food Insecurity   Worried About Charity fundraiser in the Last Year: Never true   Ran Out of Food in the Last Year: Never true  Housing: Not on file  Physical Activity: Inactive   Days of Exercise per Week: 0 days   Minutes of Exercise per Session: 0 min  Social Connections: Not on file  Stress: Not on file  Tobacco Use: Low Risk    Smoking Tobacco Use: Never   Smokeless Tobacco Use: Never   Passive Exposure: Not on file  Transportation Needs: No Transportation Needs   Lack of Transportation (Medical): No   Lack of Transportation (Non-Medical): No    CCM Care Plan  Allergies  Allergen Reactions   Almond Oil Anaphylaxis, Shortness Of Breath and Swelling   Morphine And Related Shortness Of Breath and Other (See Comments)    Pt. States while in the hospital it affected her breathing, O2 dropped to the 70's   Atorvastatin Other (See Comments)    Leg weakness    Ceclor [Cefaclor] Nausea And Vomiting   Sulfa Antibiotics Nausea And Vomiting   Ciprofloxacin Other (See Comments)    Makes joints and muscles ache   Levaquin [Levofloxacin] Other (See Comments)    Body aches   Losartan Other (See Comments)    Weakness    Statins Other (See Comments)    Leg and body weakness    Medications Reviewed Today     Reviewed by Debbora Dus, [facility] (Pharmacist) on 04/10/21 at [place] List Status: <None>   Medication Order Taking? Sig Documenting Provider Last Dose Status Informant  acetaminophen (TYLENOL) 500 MG tablet [id number] Yes Take 500 mg by mouth every 6 (six) hours as needed for moderate pain. [provider] Taking Active Self  albuterol (VENTOLIN HFA) 108 (90 Base) MCG/ACT inhaler [id number] Yes Inhale 2 puffs into the lungs every 6 (six) hours as needed. [name], MD Taking Active Self  BAYER CONTOUR NEXT TEST test strip [id number] Yes 1 each by Other route daily. As directed [provider] Taking Active Self           Med Note Tamala Julian, JEFFREY W   Mon May 03, 2017 11:02 PM)    budesonide (PULMICORT) 0.5 MG/2ML nebulizer solution [id number] Yes Take 2 mLs (0.5 mg total) by nebulization 2 (two) times daily. [name], MD Taking Active Self  buPROPion (WELLBUTRIN XL) 300 MG 24 hr tablet [id number] Yes TAKE 1 TABLET BY MOUTH DAILY [name], MD Taking Active Self  Cholecalciferol (VITAMIN D-3) 5000 UNITS TABS [id number] Yes Take 5,000 Units by mouth every other day.  [provider] Taking Active Self  colchicine 0.6 MG tablet [id number] Yes Take 1 tablet (0.6 mg total) by mouth daily as needed (for gout). [name], MD Taking Active Self  dicyclomine (BENTYL) 20 MG tablet [id number] Yes TAKE 1 TABLET UP TO FOUR TIMES A DAY FOR GI CRAMPING, PAIN, NAUSEA, AND VOMITING AS NEEDED [name], MD Taking Active   Dulaglutide (TRULICITY) 4.5 YI/9.4WN SOPN [id number] Yes Inject 4.5 mg as  directed once a week.  [name], MD Taking Active Self  DULoxetine (CYMBALTA) 60 MG capsule [id number] Yes TAKE 1 CAPSULE BY MOUTH ONCE DAILY. [name], MD Taking Active   fluticasone [facility]) 50 MCG/ACT nasal spray [id number] Yes Place 2 sprays into both nostrils daily. Evlyn Courier, PA-C Taking Active Self  furosemide (LASIX) 20 MG tablet [id number] Yes Take 3 tablets (60 mg total) by mouth 2 (two) times daily. Richardson Dopp T, PA-C Taking Active Self  gabapentin (NEURONTIN) 100 MG capsule [id number] Yes TAKE 3 CAPSULES BY MOUTH AT BEDTIME. [name], MD Taking Active   GLOBAL EASE INJECT PEN NEEDLES 32G X 4 MM MISC [id number] Yes USE AS DIRECTED WITH VICTOZA AND TRESIBA. [name], MD Taking Active Self  ipratropium-albuterol (DUONEB) 0.5-2.5 (3) MG/3ML SOLN [id number] Yes Take 3 mLs by nebulization every 6 (six) hours as needed (wheezing). [name], MD Taking Active Self  Iron, Ferrous Sulfate, 325 (65 Fe) MG TABS [id number] Yes Take 325 mg by mouth every other day. [name], MD Taking Active   metolazone (ZAROXOLYN) 5 MG tablet [id number] Yes TAKE 1 TABLET BY MOUTH AS NEEDED. [name], MD Taking Active Self  MICROLET LANCETS [place] [id number] Yes 1 each by Other route. As directed [provider] Taking Active Self           Med Note Mickie Bail Oct 21, 2015  3:31 PM)    nystatin (MYCOSTATIN/NYSTOP) powder [id number] Yes Apply 1 application topically 3 (three) times daily. [name], MD Taking Active Self  ondansetron [facility]) 4 MG tablet [id number] Yes Take 1 tablet (4 mg total) by mouth every 6 (six) hours.  Patient taking differently: Take 4 mg by mouth every 8 (eight) hours as needed for vomiting or nausea.   Faustino Congress, NP Taking Active Self  oxyCODONE-acetaminophen (PERCOCET/ROXICET) 5-325 MG tablet [id number] Yes Take 1 tablet by mouth every 6 (six) hours as needed for severe pain. Blanchie Dessert, MD Taking Active Self  pantoprazole  (PROTONIX) 40 MG tablet [id number] Yes TAKE 1 TABLET BY MOUTH AT BEDTIME [name], MD Taking Active   polyethylene glycol [facility] / GLYCOLAX) packet [id number] Yes Take 17 g by mouth daily as needed for mild constipation. [provider] Taking Active Self  potassium chloride (MICRO-K) 10 MEQ CR capsule [id number] Yes TAKE 5 CAPSULES (50 MEQ TOTAL) BY MOUTH 2 (TWO) TIMES DAILY Richardson Dopp T, PA-C Taking Active Self  promethazine-dextromethorphan (PROMETHAZINE-DM) 6.25-15 MG/5ML syrup [id number] Yes Take 2.5 mLs by mouth 4 (four) times daily as needed for cough (sedation caution). [name], MD Taking Active   propranolol (INDERAL) 10 MG tablet [id number] Yes TAKE 1 TABLET BY MOUTH 2 TIMES DAILY [name], MD Taking Active   revefenacin [facility]) 175 MCG/3ML nebulizer solution [id number] Yes Take 3 mLs (175 mcg total) by nebulization daily. [name], MD Taking Active Self  rOPINIRole (REQUIP) 4 MG tablet [id number] Yes TAKE 1 TABLET BY MOUTH TWICE DAILY [name], MD Taking Active   spironolactone (ALDACTONE) 25 MG tablet [id number] Yes TAKE 1 TABLET BY MOUTH DAILY [name], MD Taking Active   traMADol (ULTRAM) 50 MG tablet [id number] Yes Take 1 tablet (50 mg total) by mouth 3 (three) times daily as needed. [name], MD Taking Active Self  TRESIBA FLEXTOUCH 200 UNIT/ML FlexTouch Pen [id number] Yes INJECT 90 UNITS UNDER SKIN ONCE DAILY [name], MD Taking Active   UNABLE  TO FIND [id number] Yes CPAP- At bedtime [provider] Taking Active Self            Patient Active Problem List   Diagnosis Date Noted   Hyperglycemia 02/14/2021   Rash 09/27/2020   COPD exacerbation ([place]) 09/21/2020   Acute exacerbation of COPD with asthma ([place]) 09/19/2020   Mixed diabetic hyperlipidemia associated with type 2 diabetes mellitus ([place]) 09/19/2020   Pain of foot 06/20/2020   Medicare annual wellness visit, initial 06/20/2020   Constipation  06/11/2020   Diverticulosis of colon 06/11/2020   Irritable bowel syndrome 15/07/6977   Periumbilical pain 48/03/6551   RLS (restless legs syndrome)    Acute asthma exacerbation 01/09/2020   Asthma exacerbation 01/08/2020   Muscle weakness 01/08/2020   Grade I diastolic dysfunction 74/82/7078   Drug-induced myopathy 10/10/2019   Advance care planning 06/14/2019   RLQ abdominal pain 06/14/2019   COPD (chronic obstructive pulmonary disease) ([place]) 06/14/2019   Leukocytosis 06/14/2019   Hypercalcemia 06/14/2019   Gout 06/14/2019   Type 2 diabetes mellitus with hyperglycemia, without long-term current use of insulin ([place])    COPD with acute exacerbation ([place]) 05/03/2017   Acute renal failure superimposed on stage 2 chronic kidney disease ([place]) 04/02/2016   Diabetes mellitus without complication (HCC)    CKD (chronic kidney disease)    Hypersomnia with sleep apnea 09/16/2015   Fatigue 06/07/2015   Morbid obesity ([place]) 06/07/2015   Chronic diastolic heart failure ([place])    Pneumonia of both lower lobes due to infectious organism 02/09/2013   OSA on CPAP 08/23/2012   Essential hypertension    Depression    GERD without esophagitis    Hyperlipidemia    Ulcerative colitis ([place])    Patellar tendinitis 05/25/2011   Knee pain 05/25/2011   Myofascial pain 05/25/2011   Cervicalgia 05/25/2011    Immunization History  Administered Date(s) Administered   Influenza Split 12/01/2012, 01/18/2017   Influenza Whole 12/25/2011   Influenza,inj,Quad PF,6+ Mos 11/27/2013, 12/08/2014, 12/11/2015   Influenza-Unspecified 12/07/2017   Janssen (J&J) SARS-COV-2 Vaccination 06/15/2019   Pneumococcal Conjugate-13 02/17/2017   Pneumococcal Polysaccharide-23 11/27/2013   Tdap 04/01/2010    Conditions to be addressed/monitored:  Hypertension, Hyperlipidemia, Diabetes, Heart Failure, COPD, Asthma, Chronic Kidney Disease, Depression and Osteoarthritis  Care Plan : [place]  Updates made by  Debbora Dus, [place] since 04/14/2021 12:00 AM     Problem: CHL AMB "PATIENT-SPECIFIC PROBLEM"      Long-Range Goal: Disease Management   Start Date: 07/29/2020  Recent Progress: Not on track  Priority: High  Note:   Current Barriers:  Unable to achieve control of diabetes  Unable to achieve control COPD, frequent exacerbations Does not maintain contact with specialists  Difficulty taking medications as prescribed  Pharmacist Clinical Goal(s):  Patient will contact provider office for questions/concerns as evidenced notation of same in electronic health record through collaboration with PharmD and provider.   Interventions: 1:1 collaboration with [name], MD regarding development and update of comprehensive plan of care as evidenced by provider attestation and co-signature Inter-disciplinary care team collaboration (see longitudinal plan of care) Comprehensive medication review performed; medication list updated in electronic medical record  Hyperlipidemia: (LDL goal < 100) -Not ideally controlled (TG 460 in 04/2019 and LDL inaccurate due to elevated TG); Not discussed 04/07/21. -Current treatment: None -Medications previously tried: Multiple statins (atorvastatin, others) - leg weakness -Educated on Cholesterol goals;  -Recommended diabetes control to reduce cardiovascular risk. Patient is due for updated lipid  panel.  Diabetes (A1c goal <7%) -Uncontrolled - A1c 10.7%, worsening  -Pt is frequently on steroids for COPD exacerbations. She was referred to endo this fall but has not been scheduled. Pt has phone number for [facility] [phone]) to schedule appointment. She reports improved BG since increasing Tresiba over the past few weeks by 1-2 units daily from 90 units to 108 to target fasting BG < 150. Reviewed her BG log today and she has not been checking as regularly.  -Current medications: Trulicity 4.5 mg - Inject once weekly  Tresiba - 108 units daily  (mid-day around 1- 4 PM) -Medications previously tried: none reported -Current home glucose readings - none reported today due to recent hospitalization, not checking -Denies hypoglycemic/hyperglycemic symptoms, denies any BG levels < 70 -Current meal patterns: The patient reports she is on a salt free, sugar free, gluten free diet but had a hard time sticking to this. She tries to limit portions.  -Annual eye and foot exam up to date  -Recommended to continue current medication; Recommend resuming daily BG checks. [place] will call in 2 weeks for log. Please reach out to [facility] for initial visit.  Heart Failure (Goal: manage symptoms and prevent exacerbations) -Controlled, per symptoms controlled. She has not been checking her weight regularly. Most recent weight was down to 251 lbs. She has a hard time taking 6 tabs of Lasix and 10 caps of potassium daily due to pill burden. -Followed by Dr. [name] with [facility] -Last ejection fraction: 70% normal (Date: 03/21) -HF type: Diastolic -NYHA Class: II (slight limitation of activity) -AHA HF Stage: C (Heart disease and symptoms present) -Current treatment: Spironolactone 25 mg - 1 tablet daily in morning  Furosemide 20 mg - 3 tablets at breakfast and noon  Potassium 10 mEq - 5 capsules at breakfast and noon Metolazone - take 1 tablet as needed for weight gain of 3 pounds in 24 hours or 5 pounds in 1 week -Medications previously tried: torsemide, bumetanide  -Recommended to continue current medication; Will add Lasix and potassium to her pill packs. Will keep metolazone in vial for PRN use.  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) -Followed by [name], MD [facility]. Pt does not follow up with pulmonology as frequently as recommended. She has had multiple exacerbations this year, but last seen 10/2020. No medication changes at that time.  Pt reports taking  albuterol/ipratropium multiple times daily. Most days uses Pulmicort once. Not taking Yupelri as often as recommended. We discussed maintenance inhaler/rescue and importance of taking as prescribed. Again reviewed her inhaler history. She reports DPI did not get into her lungs but she has not tried any MDIs. Judithann Sauger may be a good option for her to reduce her time using nebulizers daily (15 min x each treatment). She is currently wearing oxgen since December hospitalization. She is visibly very winded without the oxygen. -Current treatment: Maintenance: Pulmicort nebuilzer - one vial nebulized in the morning and one vial nebulized in the evening, and rinse your mouth after each use of this Yupelri nebulizer - one vial nebulized daily Rescue: Albuterol/ipratropium nebulizer - takes 2-3 times per day as needed Albuterol inhaler - 2 puffs every 6 hours PRN -Compliant with CPAP nightly -Medications previously tried: per pt inhalers less effective than nebulizers, Perforomist was unaffordable; She was previously on Advair, Incruse and most recently Trelegy but says she has stopped these because she does not like the powder inhalers. [place] Rm  Grade: Gold 3 (FEV1 30-49%) -Pulmonary function testing: 2021 FEV1 % pred (post) 42% -Current COPD Classification:  D (high sx, >2 exacerbations/yr) -Exacerbations requiring treatment in last 6 months: yes, multiple -Patient reports consistent use of maintenance nebulizers. It is difficult to complete each nebulizer treatment due to time -Frequency of rescue inhaler use: 2-3 times daily  -Recommended to continue current medication; Recommend schedule follow up with pulmonology - if not discussed before, may suggest trial of Breztri since this is an MDI and not a dry powdered inhaler.  Depression/Anxiety/Pain(Goal: Control symptoms) -Controlled, stable per patient - not discussed 04/07/21 -Current treatment: Bupropion 300 mg - 1 tablet daily  Cymbalta 60 mg - 1 in the  evening (for mood and neuropathy) Gabapentin 100 mg - 3 caps at bedtime  Ropinirole 4 mg - 1 tablet twice daily for RLS  -Medications previously tried/failed: none -I am concerned her ropinirole may be causing her to fall asleep suddenly during the day. This has been discussed with patient before and per chart records, she was unable to tolerate dose reductions. We will revisit this issue at follow up visit.  -Recommended to continue current medication  Gout (Goal: Prevent gout flares) -Improving, PCP started allopurinol 08/18/20 - not discussed 04/07/21 -Current treatment: Allopurinol 50 mg - 1/2 tablet daily (new) taking in evening Colchicine - 1 daily at first sign of flare -Recommended to continue current medication  Patient Goals/Self-Care Activities Patient will:  - work with pharmacist to improve medication adherence through pill packaging  Follow Up Plan: Telephone follow up appointment with care management team member scheduled for: 6 weeks       Medication Assistance: None required.  Patient affirms current coverage meets needs.  Patient's preferred pharmacy is: [facility], [address] [address] Phone: [phone] Fax: [phone]  [id number] [place] [id number]: [phone]: [phone] Uses pill box? No - she uses pill packs for maintenance meds   She gets packaged medications from [facility]. [facility].   Verbal consent obtained for [facility] enhanced pharmacy services (medication synchronization, adherence packaging, delivery coordination). A medication sync plan was created to allow patient to get all medications delivered once every 30 to 90 days per patient preference. Patient understands they have freedom to choose pharmacy and clinical pharmacist will coordinate care  between all prescribers and [facility].  Plan for packs below: Will start May 15, 2021 PACKS: Breakfast (10-11 AM) 1 Wellbutrin 300 mg XR  1 Vitamin D3 5000 IU - every other day  1 Ropinirole 4 mg 1 Pantoprazole 40 mg  1 Spironolactone 25 mg  1 Propranolol 10 mg  3 tablets furosemide 20 mg  3 potassium 10 mEq   Mid- day (2-3 PM) 3 tablets furosemide 20 mg 3 potassium 10 mEq  1 mesalamine 1.2 gm   Supper (5-6 PM) 1 Cymbalta 60 mg  3 gabapentin 100 mg  1 propranolol 10 mg 1 ropinirole 4 mg  1 Ferrous sulfate QOD 325 mg  1 Propranolol 10 mg  4 potassium 10 mEq  Other /VIALS: Injections: -[place] and [place] (filled 30 day supply 10/88/11 -Trulicity 4.5 mg on Thurs or Friday (filled 30 day supply 04/07/21)  As needed:  -Bentyl - uses as needed for flares -Tylenol - 1 before bedtime -Meloxicam 15 mg - short term for arthritis in ankle -Metolazone - for  fluid overload  -Tramadol - PRN diverticulitis   Care Plan and Follow Up Patient Decision:  Patient agrees to Care Plan and Follow-up.  Debbora Dus, PharmD Clinical Pharmacist [facility] [place] [facility] [phone]

## 2021-04-08 DIAGNOSIS — I110 Hypertensive heart disease with heart failure: Secondary | ICD-10-CM

## 2021-04-08 DIAGNOSIS — Z794 Long term (current) use of insulin: Secondary | ICD-10-CM

## 2021-04-08 DIAGNOSIS — J449 Chronic obstructive pulmonary disease, unspecified: Secondary | ICD-10-CM | POA: Diagnosis not present

## 2021-04-08 DIAGNOSIS — E1159 Type 2 diabetes mellitus with other circulatory complications: Secondary | ICD-10-CM

## 2021-04-08 DIAGNOSIS — I5032 Chronic diastolic (congestive) heart failure: Secondary | ICD-10-CM | POA: Diagnosis not present

## 2021-04-09 DIAGNOSIS — J101 Influenza due to other identified influenza virus with other respiratory manifestations: Secondary | ICD-10-CM | POA: Diagnosis not present

## 2021-04-09 DIAGNOSIS — J441 Chronic obstructive pulmonary disease with (acute) exacerbation: Secondary | ICD-10-CM | POA: Diagnosis not present

## 2021-04-09 DIAGNOSIS — D631 Anemia in chronic kidney disease: Secondary | ICD-10-CM | POA: Diagnosis not present

## 2021-04-09 DIAGNOSIS — J45901 Unspecified asthma with (acute) exacerbation: Secondary | ICD-10-CM | POA: Diagnosis not present

## 2021-04-09 DIAGNOSIS — N390 Urinary tract infection, site not specified: Secondary | ICD-10-CM | POA: Diagnosis not present

## 2021-04-09 DIAGNOSIS — I5032 Chronic diastolic (congestive) heart failure: Secondary | ICD-10-CM | POA: Diagnosis not present

## 2021-04-09 DIAGNOSIS — N1831 Chronic kidney disease, stage 3a: Secondary | ICD-10-CM | POA: Diagnosis not present

## 2021-04-09 DIAGNOSIS — E1122 Type 2 diabetes mellitus with diabetic chronic kidney disease: Secondary | ICD-10-CM | POA: Diagnosis not present

## 2021-04-09 DIAGNOSIS — J9601 Acute respiratory failure with hypoxia: Secondary | ICD-10-CM | POA: Diagnosis not present

## 2021-04-09 DIAGNOSIS — I130 Hypertensive heart and chronic kidney disease with heart failure and stage 1 through stage 4 chronic kidney disease, or unspecified chronic kidney disease: Secondary | ICD-10-CM | POA: Diagnosis not present

## 2021-04-14 DIAGNOSIS — J449 Chronic obstructive pulmonary disease, unspecified: Secondary | ICD-10-CM | POA: Diagnosis not present

## 2021-04-14 MED ORDER — TRULICITY 4.5 MG/0.5ML ~~LOC~~ SOAJ: 4.5000 mg | SUBCUTANEOUS | 1 refills | Status: DC

## 2021-04-14 MED ORDER — IRON (FERROUS SULFATE) 325 (65 FE) MG PO TABS: 325.0000 mg | ORAL_TABLET | ORAL | 11 refills | Status: DC

## 2021-04-14 MED ORDER — TRESIBA FLEXTOUCH 200 UNIT/ML ~~LOC~~ SOPN: PEN_INJECTOR | SUBCUTANEOUS | 1 refills | Status: DC

## 2021-04-14 NOTE — Patient Instructions (Signed)
Dear Kathleen Hatfield,  Below is a summary of the goals we discussed during our follow up appointment on April 07, 2021. Please contact me anytime with questions or concerns.   Visit Information   Patient Care Plan: CCM Pharmacy Care Plan     Problem Identified: CHL AMB "PATIENT-SPECIFIC PROBLEM"      Long-Range Goal: Disease Management   Start Date: 07/29/2020  Recent Progress: Not on track  Priority: High  Note:   Current Barriers:  Unable to achieve control of diabetes  Unable to achieve control COPD, frequent exacerbations Does not maintain contact with specialists  Difficulty taking medications as prescribed  Pharmacist Clinical Goal(s):  Patient will contact provider office for questions/concerns as evidenced notation of same in electronic health record through collaboration with PharmD and provider.   Interventions: 1:1 collaboration with [name], MD regarding development and update of comprehensive plan of care as evidenced by provider attestation and co-signature Inter-disciplinary care team collaboration (see longitudinal plan of care) Comprehensive medication review performed; medication list updated in electronic medical record  Hyperlipidemia: (LDL goal < 100) -Not ideally controlled (TG 460 in 04/2019 and LDL inaccurate due to elevated TG); Not discussed 04/07/21. -Current treatment: None -Medications previously tried: Multiple statins (atorvastatin, others) - leg weakness -Educated on Cholesterol goals;  -Recommended diabetes control to reduce cardiovascular risk. Patient is due for updated lipid panel.  Diabetes (A1c goal <7%) -Uncontrolled - A1c 10.7%, worsening  -Pt is frequently on steroids for COPD exacerbations. She was referred to endo this fall but has not been scheduled. Pt has phone number for [facility] [phone]) to schedule appointment. She reports improved BG since increasing Tresiba over the past few weeks by 1-2 units daily  from 90 units to 108 to target fasting BG < 150. Reviewed her BG log today and she has not been checking as regularly.  -Current medications: Trulicity 4.5 mg - Inject once weekly  Tresiba - 108 units daily (mid-day around 1- 4 PM) -Medications previously tried: none reported -Current home glucose readings - none reported today due to recent hospitalization, not checking -Denies hypoglycemic/hyperglycemic symptoms, denies any BG levels < 70 -Current meal patterns: The patient reports she is on a salt free, sugar free, gluten free diet but had a hard time sticking to this. She tries to limit portions.  -Annual eye and foot exam up to date  -Recommended to continue current medication; Recommend resuming daily BG checks. [place] will call in 2 weeks for log. Please reach out to [facility] for initial visit.  Heart Failure (Goal: manage symptoms and prevent exacerbations) -Controlled, per symptoms controlled. She has not been checking her weight regularly. Most recent weight was down to 251 lbs. She has a hard time taking 6 tabs of Lasix and 10 caps of potassium daily due to pill burden. -Followed by Dr. [name] with [facility] -Last ejection fraction: 70% normal (Date: 03/21) -HF type: Diastolic -NYHA Class: II (slight limitation of activity) -AHA HF Stage: C (Heart disease and symptoms present) -Current treatment: Spironolactone 25 mg - 1 tablet daily in morning  Furosemide 20 mg - 3 tablets at breakfast and noon  Potassium 10 mEq - 5 capsules at breakfast and noon Metolazone - take 1 tablet as needed for weight gain of 3 pounds in 24 hours or 5 pounds in 1 week -Medications previously tried: torsemide, bumetanide  -Recommended to continue current medication; Will add Lasix and potassium to her pill packs. Will keep  metolazone in vial for PRN use.  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) -Followed by [name], MD [place]  [facility]. Pt does not follow up with pulmonology as frequently as recommended. She has had multiple exacerbations this year, but last seen 10/2020. No medication changes at that time.  Pt reports taking albuterol/ipratropium multiple times daily. Most days uses Pulmicort once. Not taking Yupelri as often as recommended. We discussed maintenance inhaler/rescue and importance of taking as prescribed. Again reviewed her inhaler history. She reports DPI did not get into her lungs but she has not tried any MDIs. Judithann Sauger may be a good option for her to reduce her time using nebulizers daily (15 min x each treatment). She is currently wearing oxgen since December hospitalization. She is visibly very winded without the oxygen. -Current treatment: Maintenance: Pulmicort nebuilzer - one vial nebulized in the morning and one vial nebulized in the evening, and rinse your mouth after each use of this Yupelri nebulizer - one vial nebulized daily Rescue: Albuterol/ipratropium nebulizer - takes 2-3 times per day as needed Albuterol inhaler - 2 puffs every 6 hours PRN -Compliant with CPAP nightly -Medications previously tried: per pt inhalers less effective than nebulizers, Perforomist was unaffordable; She was previously on Advair, Incruse and most recently Trelegy but says she has stopped these because she does not like the powder inhalers. -Gold Grade: Gold 3 (FEV1 30-49%) -Pulmonary function testing: 2021 FEV1 % pred (post) 42% -Current COPD Classification:  D (high sx, >2 exacerbations/yr) -Exacerbations requiring treatment in last 6 months: yes, multiple -Patient reports consistent use of maintenance nebulizers. It is difficult to complete each nebulizer treatment due to time -Frequency of rescue inhaler use: 2-3 times daily  -Recommended to continue current medication; Recommend schedule follow up with pulmonology - if not discussed before, may suggest trial of Breztri since this is an MDI and not a dry  powdered inhaler.  Depression/Anxiety/Pain(Goal: Control symptoms) -Controlled, stable per patient - not discussed 04/07/21 -Current treatment: Bupropion 300 mg - 1 tablet daily  Cymbalta 60 mg - 1 in the evening (for mood and neuropathy) Gabapentin 100 mg - 3 caps at bedtime  Ropinirole 4 mg - 1 tablet twice daily for RLS  -Medications previously tried/failed: none -I am concerned her ropinirole may be causing her to fall asleep suddenly during the day. This has been discussed with patient before and per chart records, she was unable to tolerate dose reductions. We will revisit this issue at follow up visit.  -Recommended to continue current medication  Gout (Goal: Prevent gout flares) -Improving, PCP started allopurinol 08/18/20 - not discussed 04/07/21 -Current treatment: Allopurinol 50 mg - 1/2 tablet daily (new) taking in evening Colchicine - 1 daily at first sign of flare -Recommended to continue current medication  Patient Goals/Self-Care Activities Patient will:  - work with pharmacist to improve medication adherence through pill packaging  Follow Up Plan: Telephone follow up appointment with care management team member scheduled for: 6 weeks      Patient verbalizes understanding of instructions and care plan provided today and agrees to view in [place]. Active MyChart status confirmed with patient.    Debbora Dus, PharmD Clinical Pharmacist Practitioner [facility] [phone]

## 2021-04-16 ENCOUNTER — Telehealth: Payer: Self-pay

## 2021-04-16 NOTE — Progress Notes (Addendum)
Coordinated medications for sync from [facility].  Debbora Dus, CPP notified  [name], [place] Assistant [phone]  Time spent: 10 min

## 2021-04-17 DIAGNOSIS — J9601 Acute respiratory failure with hypoxia: Secondary | ICD-10-CM | POA: Diagnosis not present

## 2021-04-17 DIAGNOSIS — J45901 Unspecified asthma with (acute) exacerbation: Secondary | ICD-10-CM | POA: Diagnosis not present

## 2021-04-17 DIAGNOSIS — J101 Influenza due to other identified influenza virus with other respiratory manifestations: Secondary | ICD-10-CM | POA: Diagnosis not present

## 2021-04-17 DIAGNOSIS — N1831 Chronic kidney disease, stage 3a: Secondary | ICD-10-CM | POA: Diagnosis not present

## 2021-04-17 DIAGNOSIS — I5032 Chronic diastolic (congestive) heart failure: Secondary | ICD-10-CM | POA: Diagnosis not present

## 2021-04-17 DIAGNOSIS — N390 Urinary tract infection, site not specified: Secondary | ICD-10-CM | POA: Diagnosis not present

## 2021-04-17 DIAGNOSIS — D631 Anemia in chronic kidney disease: Secondary | ICD-10-CM | POA: Diagnosis not present

## 2021-04-17 DIAGNOSIS — E1122 Type 2 diabetes mellitus with diabetic chronic kidney disease: Secondary | ICD-10-CM | POA: Diagnosis not present

## 2021-04-17 DIAGNOSIS — I130 Hypertensive heart and chronic kidney disease with heart failure and stage 1 through stage 4 chronic kidney disease, or unspecified chronic kidney disease: Secondary | ICD-10-CM | POA: Diagnosis not present

## 2021-04-17 DIAGNOSIS — J441 Chronic obstructive pulmonary disease with (acute) exacerbation: Secondary | ICD-10-CM | POA: Diagnosis not present

## 2021-04-21 DIAGNOSIS — J449 Chronic obstructive pulmonary disease, unspecified: Secondary | ICD-10-CM | POA: Diagnosis not present

## 2021-04-24 ENCOUNTER — Other Ambulatory Visit: Payer: Self-pay | Admitting: Family Medicine

## 2021-04-24 DIAGNOSIS — E1165 Type 2 diabetes mellitus with hyperglycemia: Secondary | ICD-10-CM

## 2021-04-25 ENCOUNTER — Telehealth: Payer: Medicare HMO

## 2021-04-28 ENCOUNTER — Ambulatory Visit (INDEPENDENT_AMBULATORY_CARE_PROVIDER_SITE_OTHER): Payer: Medicare HMO

## 2021-04-28 DIAGNOSIS — J441 Chronic obstructive pulmonary disease with (acute) exacerbation: Secondary | ICD-10-CM

## 2021-04-28 DIAGNOSIS — E119 Type 2 diabetes mellitus without complications: Secondary | ICD-10-CM

## 2021-04-28 DIAGNOSIS — I5032 Chronic diastolic (congestive) heart failure: Secondary | ICD-10-CM

## 2021-04-28 NOTE — Patient Instructions (Signed)
Visit Information  Thank you for taking time to visit with me today. Please don't hesitate to contact me if I can be of assistance to you before our next scheduled telephone appointment.  Following are the goals we discussed today:  Call pharmacy for medication refills 3-7 days in advance of running out of medications.  Contact practice pharmacist, Debbora Dus if you have questions/ concerns regarding your medications.   - Continue to take your medications as prescribed and rinse mouth out after use on inhalers - keep follow-up appointments with your providers - listen for public air quality announcements every day -Identify and avoid symptom triggers, such as second hand smoke, virus, weather change, emotional upset, heavy exercise,  and environmental allergens that could exacerbate your COPD or asthma.  - Continue to wear your CPAP machine nightly and use your oxygen as advised by your doctor.  -Schedule follow up appointments with your pulmonologist and cardiologist and endocrinologist. - Check blood sugars daily.   Check blood sugar if you feel it is too high or too low and take your blood sugar readings or monitor to your doctor appointment.  - Plan to eat low carbohydrate and low salt meals, watch portion sizes and avoid sugar sweetened drinks.   Our next appointment is by telephone on 06/03/21 at 2:00 PM  Please call the care guide team at [phone] if you need to cancel or reschedule your appointment.   If you are experiencing a Mental Health or [place] or need someone to talk to, please call the Suicide and Crisis Lifeline: 988 call 1-800-273-TALK (toll free, 24 hour hotline)   Patient verbalizes understanding of instructions and care plan provided today and agrees to view in [place]. Active MyChart status confirmed with patient.    Quinn Plowman RN,BSN,CCM RN Case Manager [place]  [phone]

## 2021-04-28 NOTE — Chronic Care Management (AMB) (Signed)
Chronic Care Management   CCM RN Visit Note  04/28/2021 Name: Kathleen Hatfield MRN: [id number] DOB: 01-Jan-1956  Subjective: Kathleen Hatfield is a 66 y.o. year old female who is a primary care patient of [name], MD. The care management team was consulted for assistance with disease management and care coordination needs.    Engaged with patient by telephone for follow up visit in response to provider referral for case management and/or care coordination services.   Consent to Services:  The patient was given information about Chronic Care Management services, agreed to services, and gave verbal consent prior to initiation of services.  Please see initial visit note for detailed documentation.   Patient agreed to services and verbal consent obtained.   Assessment: Review of patient past medical history, allergies, medications, health status, including review of consultants reports, laboratory and other test data, was performed as part of comprehensive evaluation and provision of chronic care management services.   SDOH (Social Determinants of Health) assessments and interventions performed:    CCM Care Plan  Allergies  Allergen Reactions   Almond Oil Anaphylaxis, Shortness Of Breath and Swelling   Morphine And Related Shortness Of Breath and Other (See Comments)    Pt. States while in the hospital it affected her breathing, O2 dropped to the 70's   Atorvastatin Other (See Comments)    Leg weakness   Ceclor [Cefaclor] Nausea And Vomiting   Sulfa Antibiotics Nausea And Vomiting   Ciprofloxacin Other (See Comments)    Makes joints and muscles ache   Levaquin [Levofloxacin] Other (See Comments)    Body aches   Losartan Other (See Comments)    Weakness    Statins Other (See Comments)    Leg and body weakness    Outpatient Encounter Medications as of 04/28/2021  Medication Sig Note   acetaminophen (TYLENOL) 500 MG tablet Take 500 mg by mouth every 6 (six) hours as needed for  moderate pain.    albuterol (VENTOLIN HFA) 108 (90 Base) MCG/ACT inhaler Inhale 2 puffs into the lungs every 6 (six) hours as needed.    budesonide (PULMICORT) 0.5 MG/2ML nebulizer solution Take 2 mLs (0.5 mg total) by nebulization 2 (two) times daily.    buPROPion (WELLBUTRIN XL) 300 MG 24 hr tablet TAKE 1 TABLET BY MOUTH DAILY    Cholecalciferol (VITAMIN D-3) 5000 UNITS TABS Take 5,000 Units by mouth every other day.     colchicine 0.6 MG tablet Take 1 tablet (0.6 mg total) by mouth daily as needed (for gout).    dicyclomine (BENTYL) 20 MG tablet TAKE 1 TABLET UP TO FOUR TIMES A DAY FOR GI CRAMPING, PAIN, NAUSEA, AND VOMITING AS NEEDED    DULoxetine (CYMBALTA) 60 MG capsule TAKE 1 CAPSULE BY MOUTH ONCE DAILY.    fluticasone (FLONASE) 50 MCG/ACT nasal spray Place 2 sprays into both nostrils daily.    furosemide (LASIX) 20 MG tablet Take 3 tablets (60 mg total) by mouth 2 (two) times daily.    gabapentin (NEURONTIN) 100 MG capsule TAKE 3 CAPSULES BY MOUTH AT BEDTIME.    insulin degludec (TRESIBA FLEXTOUCH) 200 UNIT/ML FlexTouch Pen INJECT 108 UNITS UNDER SKIN ONCE DAILY    ipratropium-albuterol (DUONEB) 0.5-2.5 (3) MG/3ML SOLN Take 3 mLs by nebulization every 6 (six) hours as needed (wheezing).    Iron, Ferrous Sulfate, 325 (65 Fe) MG TABS Take 325 mg by mouth every other day. 04/28/2021: Patient states she takes sporatically   metolazone (ZAROXOLYN) 5 MG tablet TAKE  1 TABLET BY MOUTH AS NEEDED.    nystatin (MYCOSTATIN/NYSTOP) powder Apply 1 application topically 3 (three) times daily.    ondansetron (ZOFRAN) 4 MG tablet Take 1 tablet (4 mg total) by mouth every 6 (six) hours. (Patient taking differently: Take 4 mg by mouth every 8 (eight) hours as needed for vomiting or nausea.)    oxyCODONE-acetaminophen (PERCOCET/ROXICET) 5-325 MG tablet Take 1 tablet by mouth every 6 (six) hours as needed for severe pain.    pantoprazole (PROTONIX) 40 MG tablet TAKE 1 TABLET BY MOUTH AT BEDTIME    polyethylene  glycol (MIRALAX / GLYCOLAX) packet Take 17 g by mouth daily as needed for mild constipation.    potassium chloride (MICRO-K) 10 MEQ CR capsule TAKE 5 CAPSULES (50 MEQ TOTAL) BY MOUTH 2 (TWO) TIMES DAILY    promethazine-dextromethorphan (PROMETHAZINE-DM) 6.25-15 MG/5ML syrup Take 2.5 mLs by mouth 4 (four) times daily as needed for cough (sedation caution).    propranolol (INDERAL) 10 MG tablet TAKE 1 TABLET BY MOUTH 2 TIMES DAILY    revefenacin (YUPELRI) 175 MCG/3ML nebulizer solution Take 3 mLs (175 mcg total) by nebulization daily.    rOPINIRole (REQUIP) 4 MG tablet TAKE 1 TABLET BY MOUTH TWICE DAILY    spironolactone (ALDACTONE) 25 MG tablet TAKE 1 TABLET BY MOUTH DAILY    traMADol (ULTRAM) 50 MG tablet Take 1 tablet (50 mg total) by mouth 3 (three) times daily as needed.    TRULICITY 4.5 CV/8.9FY SOPN INJECT 4.5 MG AS DIRECTED ONCE A WEEK.    BAYER CONTOUR NEXT TEST test strip 1 each by Other route daily. As directed    GLOBAL EASE INJECT PEN NEEDLES 32G X 4 MM MISC USE AS DIRECTED WITH VICTOZA AND TRESIBA.    MICROLET LANCETS MISC 1 each by Other route. As directed    UNABLE TO FIND CPAP- At bedtime    No facility-administered encounter medications on file as of 04/28/2021.    Patient Active Problem List   Diagnosis Date Noted   Hyperglycemia 02/14/2021   Rash 09/27/2020   COPD exacerbation ([place]) 09/21/2020   Acute exacerbation of COPD with asthma ([place]) 09/19/2020   Mixed diabetic hyperlipidemia associated with type 2 diabetes mellitus ([place]) 09/19/2020   Pain of foot 06/20/2020   Medicare annual wellness visit, initial 06/20/2020   Constipation 06/11/2020   Diverticulosis of colon 06/11/2020   Irritable bowel syndrome 12/23/5100   Periumbilical pain 58/52/7782   RLS (restless legs syndrome)    Acute asthma exacerbation 01/09/2020   Asthma exacerbation 01/08/2020   Muscle weakness 01/08/2020   Grade I diastolic dysfunction 42/35/3614   Drug-induced myopathy 10/10/2019   Advance  care planning 06/14/2019   RLQ abdominal pain 06/14/2019   COPD (chronic obstructive pulmonary disease) ([place]) 06/14/2019   Leukocytosis 06/14/2019   Hypercalcemia 06/14/2019   Gout 06/14/2019   Type 2 diabetes mellitus with hyperglycemia, without long-term current use of insulin (HCC)    COPD with acute exacerbation ([place]) 05/03/2017   Acute renal failure superimposed on stage 2 chronic kidney disease ([place]) 04/02/2016   Diabetes mellitus without complication (HCC)    CKD (chronic kidney disease)    Hypersomnia with sleep apnea 09/16/2015   Fatigue 06/07/2015   Morbid obesity ([place]) 06/07/2015   Chronic diastolic heart failure (HCC)    Pneumonia of both lower lobes due to infectious organism 02/09/2013   OSA on CPAP 08/23/2012   Essential hypertension    Depression    GERD without esophagitis    Hyperlipidemia  Ulcerative colitis ([place])    Patellar tendinitis 05/25/2011   Knee pain 05/25/2011   Myofascial pain 05/25/2011   Cervicalgia 05/25/2011    Conditions to be addressed/monitored:CHF, COPD, and DMII  Care Plan : [facility] plan of care  Updates made by Dannielle Karvonen, RN since 04/28/2021 12:00 AM     Problem: chronic disease management, education and/ or care coordination   Priority: High     Long-Range Goal: Development of plan of care to address chronic disease management and / or care coordination needs.   Start Date: 02/07/2021  Expected End Date: 05/06/2021  Priority: High  Note:   Current Barriers:  Knowledge Deficits related to plan of care for management of COPD and DMII  Chronic Disease Management support and education needs related to COPD and DMII Patient states  she continues to wear her oxygen at 2 L.  She reports using her inhaler as needed and does nebulizer treatments daily.  She denies any increase in shortness of breath.  Patient states she is checking her blood sugars 1 time per week. She reports her blood sugars ranging from 130's to 180's.  Patient denies  any blood sugars <70.  Patient states she is weighing herself 1 time per week.  Reports her most recent weight is 259 lbs.  Denies lower extremity swelling. Patient reports she had an episode of gout approximately 1 week ago. She reports her symptoms have resolved since treatment.  Patient states she will start getting her medications through [facility] home delivery.   Patient states she has not seen the endocrinologist as advised by doctor. She states she has to reschedule her appointment.  Patient states she has not scheduled an appointment with her pulmonologist.   RNCM Clinical Goal(s):  Patient will verbalize basic understanding of COPD and DMII disease process and self health management plan as evidenced by patient report and/ or notation in chart.  take all medications exactly as prescribed and will call provider for medication related questions as evidenced by patient self report and/ or notation in chart    attend all scheduled medical appointments:   as evidenced by patient self report and/ or notation in chart.         continue to work with Consulting civil engineer and/or Social Worker to address care management and care coordination needs related to COPD and DMII as evidenced by adherence to CM Team Scheduled appointments     through collaboration with Consulting civil engineer, provider, and care team.   Interventions: 1:1 collaboration with primary care provider regarding development and update of comprehensive plan of care as evidenced by provider attestation and co-signature Inter-disciplinary care team collaboration (see longitudinal plan of care) Evaluation of current treatment plan related to  self management and patient's adherence to plan as established by provider   COPD Interventions:  (Status:  Goal on track:  Yes.) Long Term Goal Discussed COPD self care/management/and exacerbation prevention Advised patient to self assesses COPD action plan zone and make appointment with provider if in the  yellow zone for 48 hours without improvement Provided education about and advised patient to utilize infection prevention strategies to reduce risk of respiratory infection Advised patient to call provider office to report ongoing symptoms, productive cough, general malaise  Reviewed medications and discussed importance of compliance.  Advised patient to schedule follow up visit with pulmonologist.    Diabetes Interventions:  (Status:  Goal on track:  Yes.) Long Term Goal Assessed patient's understanding of A1c goal: <7% Reviewed  medications with patient and discussed importance of medication adherence Discussed plans with patient for ongoing care management follow up and provided patient with direct contact information for care management team Provided patient with written educational materials related to hypoglycemia and importance of correct treatment Reviewed scheduled/upcoming provider appointments Advised patient of importance of monitoring blood sugars daily Lab Results  Component Value Date   HGBA1C 9.4 (H) 11/28/2020   Heart Failure Interventions:  (Status:  New goal.) Long Term Goal Discussed low sodium diet and advised patient to adhere.  Reviewed Heart Failure Action Plan in depth and provided written copy Discussed importance of daily weight and advised patient to weigh and record daily Reviewed role of diuretics in prevention of fluid overload and management of heart failure; Discussed the importance of keeping all appointments with provider   Advised patient to notify Debbora Dus, practice pharmacist if she has concerns / questions regarding her medications.   Patient Goals/Self-Care Activities: Call your doctor to report ongoing cold/ cough/ fatigue symptoms Call pharmacy for medication refills 3-7 days in advance of running out of medications.  Contact practice pharmacist, Debbora Dus if you have questions/ concerns regarding your medications.   - Continue to take  your medications as prescribed and rinse mouth out after use on inhalers - keep follow-up appointments with your providers - listen for public air quality announcements every day -Identify and avoid symptom triggers, such as second hand smoke, virus, weather change, emotional upset, heavy exercise,  and environmental allergens that could exacerbate your COPD or asthma.  - Continue to wear your CPAP machine nightly and use your oxygen as advised by your doctor.  -Schedule follow up appointments with your pulmonologist and cardiologist and endocrinologist. - Check blood sugars daily.   Check blood sugar if you feel it is too high or too low and take your blood sugar readings or monitor to your doctor appointment.  - Plan to eat low carbohydrate and low salt meals, watch portion sizes and avoid sugar sweetened drinks.       Plan:The patient has been provided with contact information for the care management team and has been advised to call with any health related questions or concerns.  The care management team will reach out to the patient again over the next 45 days. Quinn Plowman RN,BSN,CCM RN Case Manager [place]  [phone]

## 2021-04-29 ENCOUNTER — Telehealth: Payer: Medicare HMO

## 2021-05-01 ENCOUNTER — Telehealth: Payer: Self-pay

## 2021-05-01 NOTE — Chronic Care Management (AMB) (Signed)
Chronic Care Management Pharmacy Assistant   Name: TERRICA DUECKER  MRN: [id number] DOB: 1955/08/02    Reason for Encounter: Medication Adherence and Delivery Coordination    Recent office visits:  04/07/21-Family Medicine-[facility] [place] visit for medication adherence -Recommend onboarding to [facility]-Stop OTC to reduce pill burden.Start Trulicity 0.3KV weekly injection   Recent consult visits:  03/31/21-[facility]-ED-Patient presented for atipical chest pain.Labs ordered, chest xray,EKG-Patient states she is currently asymptomatic and if remainder of labs are unremarkable, she would be appropriate for outpatient management and increase of her Protonix dose to twice daily.  Hospital visits:  None in previous 6 months  Medications: Outpatient Encounter Medications as of 05/01/2021  Medication Sig Note   acetaminophen (TYLENOL) 500 MG tablet Take 500 mg by mouth every 6 (six) hours as needed for moderate pain.    albuterol (VENTOLIN HFA) 108 (90 Base) MCG/ACT inhaler Inhale 2 puffs into the lungs every 6 (six) hours as needed.    BAYER CONTOUR NEXT TEST test strip 1 each by Other route daily. As directed    budesonide (PULMICORT) 0.5 MG/2ML nebulizer solution Take 2 mLs (0.5 mg total) by nebulization 2 (two) times daily.    buPROPion (WELLBUTRIN XL) 300 MG 24 hr tablet TAKE 1 TABLET BY MOUTH DAILY    Cholecalciferol (VITAMIN D-3) 5000 UNITS TABS Take 5,000 Units by mouth every other day.     colchicine 0.6 MG tablet Take 1 tablet (0.6 mg total) by mouth daily as needed (for gout).    dicyclomine (BENTYL) 20 MG tablet TAKE 1 TABLET UP TO FOUR TIMES A DAY FOR GI CRAMPING, PAIN, NAUSEA, AND VOMITING AS NEEDED    DULoxetine (CYMBALTA) 60 MG capsule TAKE 1 CAPSULE BY MOUTH ONCE DAILY.    fluticasone (FLONASE) 50 MCG/ACT nasal spray Place 2 sprays into both nostrils daily.    furosemide (LASIX) 20 MG tablet Take 3 tablets (60 mg total) by mouth 2 (two) times daily.     gabapentin (NEURONTIN) 100 MG capsule TAKE 3 CAPSULES BY MOUTH AT BEDTIME.    GLOBAL EASE INJECT PEN NEEDLES 32G X 4 MM MISC USE AS DIRECTED WITH VICTOZA AND TRESIBA.    insulin degludec (TRESIBA FLEXTOUCH) 200 UNIT/ML FlexTouch Pen INJECT 108 UNITS UNDER SKIN ONCE DAILY    ipratropium-albuterol (DUONEB) 0.5-2.5 (3) MG/3ML SOLN Take 3 mLs by nebulization every 6 (six) hours as needed (wheezing).    Iron, Ferrous Sulfate, 325 (65 Fe) MG TABS Take 325 mg by mouth every other day. 04/28/2021: Patient states she takes sporatically   metolazone (ZAROXOLYN) 5 MG tablet TAKE 1 TABLET BY MOUTH AS NEEDED.    MICROLET LANCETS MISC 1 each by Other route. As directed    nystatin (MYCOSTATIN/NYSTOP) powder Apply 1 application topically 3 (three) times daily.    ondansetron (ZOFRAN) 4 MG tablet Take 1 tablet (4 mg total) by mouth every 6 (six) hours. (Patient taking differently: Take 4 mg by mouth every 8 (eight) hours as needed for vomiting or nausea.)    oxyCODONE-acetaminophen (PERCOCET/ROXICET) 5-325 MG tablet Take 1 tablet by mouth every 6 (six) hours as needed for severe pain.    pantoprazole (PROTONIX) 40 MG tablet TAKE 1 TABLET BY MOUTH AT BEDTIME    polyethylene glycol (MIRALAX / GLYCOLAX) packet Take 17 g by mouth daily as needed for mild constipation.    potassium chloride (MICRO-K) 10 MEQ CR capsule TAKE 5 CAPSULES (50 MEQ TOTAL) BY MOUTH 2 (TWO) TIMES DAILY    promethazine-dextromethorphan (PROMETHAZINE-DM)  6.25-15 MG/5ML syrup Take 2.5 mLs by mouth 4 (four) times daily as needed for cough (sedation caution).    propranolol (INDERAL) 10 MG tablet TAKE 1 TABLET BY MOUTH 2 TIMES DAILY    revefenacin (YUPELRI) 175 MCG/3ML nebulizer solution Take 3 mLs (175 mcg total) by nebulization daily.    rOPINIRole (REQUIP) 4 MG tablet TAKE 1 TABLET BY MOUTH TWICE DAILY    spironolactone (ALDACTONE) 25 MG tablet TAKE 1 TABLET BY MOUTH DAILY    traMADol (ULTRAM) 50 MG tablet Take 1 tablet (50 mg total) by mouth 3  (three) times daily as needed.    TRULICITY 4.5 BO/1.7PZ SOPN INJECT 4.5 MG AS DIRECTED ONCE A WEEK.    UNABLE TO FIND CPAP- At bedtime    No facility-administered encounter medications on file as of 05/01/2021.    BP Readings from Last 3 Encounters:  03/31/21 118/65  03/11/21 120/84  02/27/21 (!) 142/80    Lab Results  Component Value Date   HGBA1C 10.9 (H) 02/14/2021      Recent OV, Consult or Hospital visit:  Recent medication changes indicated:   03/31/21-[facility] ED--Patient presented for atipical chest pain.Labs ordered, chest xray,EKG-Patient states she is currently asymptomatic and if remainder of labs are unremarkable, she would be appropriate for outpatient management and increase of her Protonix dose to twice daily.   Last adherence delivery date:  New Onboard      Patient is due for next adherence delivery on: 05/13/21  Spoke with patient on 05/02/21 reviewed medications and coordinated delivery.  This delivery to include: Adherence Packaging  30 Days  Packs: Bupropion HCL XL 343m- take 1 tablet at evening meal Cholecalciferol (vit D3)-5000units- take 1 tablet breakfast every other day  Ropinirole 450m take 1 tablet breakfast 1 tablet bedtime  Pantoprazole 4034mtake 1 tablet bedtime Spironolactone 12m46make 1 tablet breakfast Propanolol 10mg67mke 1 tablet breakfast 1 tablet evening meal  Furosemide 20mg 29m 3 tablets breakfast 3 tablet lunchtime Potassium chloride ER- take 4 tablets breakfast 2 tablets lunch 4 tablets evening meal Duloxetine 60mg- 43m 1 tablet breakfast Ferrous Sulfate 312mg- t64m1 tablet evening meal every other day  Gabapentin 300mg-tak26mtablets bedtime Mesalamine 1.2gm -take 1 tablet lunchtime  VIAL medications: None requested   Patient declined the following medications this month: Trulicity 4.5mg- in0.2HEonce weekly- [facility] Tresiba -inject 108 units daily - has supply   Any concerns about your medications?  No  How often do you forget or accidentally miss a dose? Rarely  Do you use a pillbox? No  Is patient in packaging - This will be [facility] [place] is asking for protonix 40mg  to 54m 2 times daily for about 1 month.      No refill request needed. Mesalamine 1.2gm  prescribed by Dr.Mann gastroenterology (patient read expiration date of 01/2022 on the bottle)  Confirmed delivery date of 05/13/21, advised patient that pharmacy will contact them the morning of delivery.  Recent blood pressure readings are as follows: No readings available    Recent blood glucose readings are as follows: Fasting:  251,  256,  257   this is normal for the patient and much improved    Patient is requesting protonix 40mg to be16mce daily for  about a month .    Annual wellness visit in last year? Yes Most Recent BP reading:120/84  87-P  03/11/21  If Diabetic: Most recent A1C reading:10.9 02/14/21 Last  eye exam / retinopathy screening:2021 Last diabetic foot exam:11/28/20  Marjo Bicker  CPP notified  [name], [place] Assistant [phone]  Total time spent for month CPA: 40 min.

## 2021-05-05 NOTE — Telephone Encounter (Signed)
Mesalamine will not be included in packs as this was prescribed by Dr Collene Mares (GI), and patient has not seen MD in 2 years, refills were not authorized.  Pt requested increasing pantoprazole to BID for 1 month. Per chart review, per ED visit 03/31/21 Protonix BID dosing was recommended in light of atypical chest pain. Forwarding to PCP for Rx.

## 2021-05-06 DIAGNOSIS — E119 Type 2 diabetes mellitus without complications: Secondary | ICD-10-CM | POA: Diagnosis not present

## 2021-05-06 DIAGNOSIS — J441 Chronic obstructive pulmonary disease with (acute) exacerbation: Secondary | ICD-10-CM

## 2021-05-06 DIAGNOSIS — I5032 Chronic diastolic (congestive) heart failure: Secondary | ICD-10-CM | POA: Diagnosis not present

## 2021-05-06 MED ORDER — PANTOPRAZOLE SODIUM 40 MG PO TBEC: 40.0000 mg | DELAYED_RELEASE_TABLET | Freq: Two times a day (BID) | ORAL | 0 refills | Status: DC

## 2021-05-06 NOTE — Telephone Encounter (Signed)
Thanks.  I sent rx for BID dosing for 1 month.

## 2021-05-06 NOTE — Addendum Note (Signed)
Addended by: [name] on: 05/06/2021 09:46 AM   Modules accepted: Orders

## 2021-05-07 ENCOUNTER — Telehealth: Payer: Self-pay | Admitting: Family Medicine

## 2021-05-07 MED ORDER — METOLAZONE 5 MG PO TABS: 5.0000 mg | ORAL_TABLET | ORAL | 1 refills | Status: DC | PRN

## 2021-05-07 NOTE — Telephone Encounter (Signed)
?  Encourage patient to contact the pharmacy for refills or they can request refills through [facility] ? ?LAST APPOINTMENT DATE:  Please schedule appointment if longer than 1 year ? ?NEXT APPOINTMENT DATE: ? ?MEDICATION:metolazone (ZAROXOLYN) 5 MG tablet ? ?Is the patient out of medication?  ? ?PHARMACY:[facility] in [place] ? ?Let patient know to contact pharmacy at the end of the day to make sure medication is ready. ? ?Please notify patient to allow 48-72 hours to process ? ?CLINICAL FILLS OUT ALL BELOW:  ? ?LAST REFILL: ? ?QTY: ? ?REFILL DATE: ? ? ? ?OTHER COMMENTS:  ? ? ?Okay for refill? ? ?Please advise ? ? ?  ?

## 2021-05-14 ENCOUNTER — Other Ambulatory Visit: Payer: Self-pay

## 2021-05-14 ENCOUNTER — Ambulatory Visit (INDEPENDENT_AMBULATORY_CARE_PROVIDER_SITE_OTHER): Payer: Medicare HMO | Admitting: Endocrinology

## 2021-05-14 ENCOUNTER — Encounter: Payer: Self-pay | Admitting: Endocrinology

## 2021-05-14 VITALS — BP 126/72 | HR 84 | Ht 65.0 in | Wt 255.4 lb

## 2021-05-14 DIAGNOSIS — E1165 Type 2 diabetes mellitus with hyperglycemia: Secondary | ICD-10-CM

## 2021-05-14 DIAGNOSIS — E119 Type 2 diabetes mellitus without complications: Secondary | ICD-10-CM

## 2021-05-14 LAB — POCT GLYCOSYLATED HEMOGLOBIN (HGB A1C): Hemoglobin A1C: 9 % — AB (ref 4.0–5.6)

## 2021-05-14 MED ORDER — TRESIBA FLEXTOUCH 200 UNIT/ML ~~LOC~~ SOPN: 120.0000 [IU] | PEN_INJECTOR | Freq: Every day | SUBCUTANEOUS | 3 refills | Status: DC

## 2021-05-14 NOTE — Progress Notes (Signed)
Subjective:    Patient ID: Kathleen Hatfield, female    DOB: Aug 31, 1955, 66 y.o.   MRN: [id number]  HPI pt is referred by Dr [name], for diabetes.  Pt states DM was dx'ed in 8527; it is complicated by PN, ASCVD, and CRI; she has been on insulin since 2020; pt says her diet is good, but exercise is not good; she has never had GDM, pancreatitis, pancreatic surgery, severe hypoglycemia or DKA.  She takes [place], 108 units QD and Trulicity.  She has recently lost 20 lbs x 18 mos. No recent steroids.  Pt says cbg varies from 78-200's.  It is in general higher as the day goes on.  She has chronic UTI's.  Past Medical History:  Diagnosis Date   Anemia    Arthritis    Asthma    Chronic diastolic CHF 78/2423   Echocardiogram 05/2019: EF 70, no RWMA, mild LVH, Gr 1 DD, normal RVSF, severe LVH, borderline asc Aorta (39 mm)   CKD (chronic kidney disease)    Depression    Diabetes mellitus without complication (HCC)    Diverticulitis    Dyspnea    with exertion   GERD (gastroesophageal reflux disease)    History of blood transfusion    Hyperlipidemia    cannot tolerate statins   Hypertension    patient states she has never had high blood pressure.    Nuclear stress test    Myoview 05/2019: EF 83, no ischemia or infarction; Low Risk   Peripheral neuropathy    Pneumonia    PONV (postoperative nausea and vomiting)    RLS (restless legs syndrome)    Sleep apnea    uses CPAP   Ulcerative colitis ([place])    dr Collene Mares    Past Surgical History:  Procedure Laterality Date   ABDOMINAL HYSTERECTOMY     APPENDECTOMY     BREAST BIOPSY     BREAST SURGERY     left biopsy   CARDIAC CATHETERIZATION     CESAREAN SECTION     CHOLECYSTECTOMY     HERNIA REPAIR     INSERTION OF MESH N/A 11/15/2015   Procedure: INSERTION OF MESH;  Surgeon: [name], MD;  Location: [facility];  Service: General;  Laterality: N/A;   LAPAROSCOPIC LYSIS OF ADHESIONS N/A 11/15/2015   Procedure: LAPAROSCOPIC LYSIS OF ADHESIONS;   Surgeon: [name], MD;  Location: [facility];  Service: General;  Laterality: N/A;   RIGHT HEART CATHETERIZATION N/A 02/27/2013   Procedure: RIGHT HEART CATH;  Surgeon: Blane Ohara, MD;  Location: [facility] CATH LAB;  Service: Cardiovascular;  Laterality: N/A;   RIGHT HEART CATHETERIZATION N/A 12/14/2013   Procedure: RIGHT HEART CATH;  Surgeon: Larey Dresser, MD;  Location: [facility] CATH LAB;  Service: Cardiovascular;  Laterality: N/A;   SIGMOIDECTOMY  2010   diverticular disease   VENTRAL HERNIA REPAIR N/A 11/15/2015   Procedure: LAPAROSCOPIC VENTRAL WALL HERNIA REPAIR;  Surgeon: [name], MD;  Location: [facility];  Service: General;  Laterality: N/A;    Social History   Socioeconomic History   Marital status: Divorced    Spouse name: Not on file   Number of children: Not on file   Years of education: Not on file   Highest education level: Not on file  Occupational History   Occupation: unemployed  Tobacco Use   Smoking status: Never   Smokeless tobacco: Never  Vaping Use   Vaping Use: Never used  Substance and Sexual Activity  Alcohol use: No   Drug use: No   Sexual activity: Not on file  Other Topics Concern   Not on file  Social History Narrative   Live with spouse   Right handed   Social Determinants of Health   Financial Resource Strain: Low Risk    Difficulty of Paying Living Expenses: Not very hard  Food Insecurity: No Food Insecurity   Worried About Running Out of Food in the Last Year: Never true   Ran Out of Food in the Last Year: Never true  Transportation Needs: No Transportation Needs   Lack of Transportation (Medical): No   Lack of Transportation (Non-Medical): No  Physical Activity: Inactive   Days of Exercise per Week: 0 days   Minutes of Exercise per Session: 0 min  Stress: Not on file  Social Connections: Not on file  Intimate Partner Violence: Not on file    Current Outpatient Medications on File Prior to Visit  Medication Sig Dispense Refill    acetaminophen (TYLENOL) 500 MG tablet Take 500 mg by mouth every 6 (six) hours as needed for moderate pain.     albuterol (VENTOLIN HFA) 108 (90 Base) MCG/ACT inhaler Inhale 2 puffs into the lungs every 6 (six) hours as needed. 17 g 2   BAYER CONTOUR NEXT TEST test strip 1 each by Other route daily. As directed     budesonide (PULMICORT) 0.5 MG/2ML nebulizer solution Take 2 mLs (0.5 mg total) by nebulization 2 (two) times daily. 360 mL 3   buPROPion (WELLBUTRIN XL) 300 MG 24 hr tablet TAKE 1 TABLET BY MOUTH DAILY 90 tablet 2   Cholecalciferol (VITAMIN D-3) 5000 UNITS TABS Take 5,000 Units by mouth every other day.      colchicine 0.6 MG tablet Take 1 tablet (0.6 mg total) by mouth daily as needed (for gout). 30 tablet 1   dicyclomine (BENTYL) 20 MG tablet TAKE 1 TABLET UP TO FOUR TIMES A DAY FOR GI CRAMPING, PAIN, NAUSEA, AND VOMITING AS NEEDED 60 tablet 1   DULoxetine (CYMBALTA) 60 MG capsule TAKE 1 CAPSULE BY MOUTH ONCE DAILY. 90 capsule 3   fluticasone (FLONASE) 50 MCG/ACT nasal spray Place 2 sprays into both nostrils daily. 9.9 mL 0   furosemide (LASIX) 20 MG tablet Take 3 tablets (60 mg total) by mouth 2 (two) times daily. 180 tablet 11   gabapentin (NEURONTIN) 100 MG capsule TAKE 3 CAPSULES BY MOUTH AT BEDTIME. 270 capsule 3   GLOBAL EASE INJECT PEN NEEDLES 32G X 4 MM MISC USE AS DIRECTED WITH VICTOZA AND TRESIBA. 100 each 4   ipratropium-albuterol (DUONEB) 0.5-2.5 (3) MG/3ML SOLN Take 3 mLs by nebulization every 6 (six) hours as needed (wheezing). 360 mL 6   Iron, Ferrous Sulfate, 325 (65 Fe) MG TABS Take 325 mg by mouth every other day. 30 tablet 11   metolazone (ZAROXOLYN) 5 MG tablet Take 1 tablet (5 mg total) by mouth as needed. 30 tablet 1   MICROLET LANCETS MISC 1 each by Other route. As directed     nystatin (MYCOSTATIN/NYSTOP) powder Apply 1 application topically 3 (three) times daily. 60 g 1   ondansetron (ZOFRAN) 4 MG tablet Take 1 tablet (4 mg total) by mouth every 6 (six) hours.  (Patient taking differently: Take 4 mg by mouth every 8 (eight) hours as needed for vomiting or nausea.) 12 tablet 0   oxyCODONE-acetaminophen (PERCOCET/ROXICET) 5-325 MG tablet Take 1 tablet by mouth every 6 (six) hours as needed for severe pain. 15  tablet 0   pantoprazole (PROTONIX) 40 MG tablet Take 1 tablet (40 mg total) by mouth 2 (two) times daily. 60 tablet 0   polyethylene glycol (MIRALAX / GLYCOLAX) packet Take 17 g by mouth daily as needed for mild constipation.     potassium chloride (MICRO-K) 10 MEQ CR capsule TAKE 5 CAPSULES (50 MEQ TOTAL) BY MOUTH 2 (TWO) TIMES DAILY 300 capsule 5   promethazine-dextromethorphan (PROMETHAZINE-DM) 6.25-15 MG/5ML syrup Take 2.5 mLs by mouth 4 (four) times daily as needed for cough (sedation caution). 120 mL 1   propranolol (INDERAL) 10 MG tablet TAKE 1 TABLET BY MOUTH 2 TIMES DAILY 180 tablet 3   revefenacin (YUPELRI) 175 MCG/3ML nebulizer solution Take 3 mLs (175 mcg total) by nebulization daily. 90 mL 3   rOPINIRole (REQUIP) 4 MG tablet TAKE 1 TABLET BY MOUTH TWICE DAILY 180 tablet 3   spironolactone (ALDACTONE) 25 MG tablet TAKE 1 TABLET BY MOUTH DAILY 30 tablet 1   traMADol (ULTRAM) 50 MG tablet Take 1 tablet (50 mg total) by mouth 3 (three) times daily as needed. 30 tablet 1   TRULICITY 4.5 XB/9.3JQ SOPN INJECT 4.5 MG AS DIRECTED ONCE A WEEK. 2 mL 1   UNABLE TO FIND CPAP- At bedtime     No current facility-administered medications on file prior to visit.    Allergies  Allergen Reactions   Almond Oil Anaphylaxis, Shortness Of Breath and Swelling   Morphine And Related Shortness Of Breath and Other (See Comments)    Pt. States while in the hospital it affected her breathing, O2 dropped to the 70's   Atorvastatin Other (See Comments)    Leg weakness   Ceclor [Cefaclor] Nausea And Vomiting   Sulfa Antibiotics Nausea And Vomiting   Ciprofloxacin Other (See Comments)    Makes joints and muscles ache   Levaquin [Levofloxacin] Other (See  Comments)    Body aches   Losartan Other (See Comments)    Weakness    Statins Other (See Comments)    Leg and body weakness    Family History  Problem Relation Age of Onset   Heart disease Mother    Hypertension Mother    Dementia Mother    Heart disease Father    COPD Father    Hypertension Father    Heart disease Brother    Other Brother        knee replacement; hip replacement   Other Brother        cant brathe when laying down   Diabetes Maternal Grandmother    Breast cancer Neg Hx    Colon cancer Neg Hx     BP 126/72    Pulse 84    Ht 5' 5"  (1.651 m)    Wt 255 lb 6.4 oz (115.8 kg)    SpO2 92%    BMI 42.50 kg/m    Review of Systems Nausea is mild.  She denies hypoglycemia.      Objective:   Physical Exam VITAL SIGNS:  See vs page GENERAL: no distress Pulses: dorsalis pedis intact bilat.   MSK: no deformity of the feet CV: 1+ bilat leg edema Skin:  no ulcer on the feet.  normal color and temp on the feet.  Neuro: sensation is intact to touch on the feet, but decreased from normal.    Lab Results  Component Value Date   CREATININE 1.24 (H) 03/31/2021   BUN 20 03/31/2021   NA 137 03/31/2021   K 3.8 03/31/2021  CL 93 (L) 03/31/2021   CO2 34 (H) 03/31/2021    Lab Results  Component Value Date   HGBA1C 9.0 (A) 05/14/2021   I have reviewed outside records, and summarized: Pt was noted to have elevated A1c, and referred here.  She was seen in 1/23.  Cbg's were high, but she had just finished a course of prednisone.       Assessment & Plan:  Insulin-requiring type 2 DM:  uncontrolled.  I advised pt to take multiple daily injections, or to change to a faster-acting qam insulin (to flatten glucoses throughout the day).  She declines both, at least for now.  She requests to increase [place] first.   Patient Instructions  good diet and exercise significantly improve the control of your diabetes.  please let me know if you wish to be referred to a dietician.   high blood sugar is very risky to your health.  you should see an eye doctor and dentist every year.  It is very important to get all recommended vaccinations.  Controlling your blood pressure and cholesterol drastically reduces the damage diabetes does to your body.  Those who smoke should quit.  Please discuss these with your doctor.  check your blood sugar twice a day.  vary the time of day when you check, between before the 3 meals, and at bedtime.  also check if you have symptoms of your blood sugar being too high or too low.  please keep a record of the readings and bring it to your next appointment here (or you can bring the meter itself).  You can write it on any piece of paper.  please call us sooner if your blood sugar goes below 70, or if most of your readings are over 200.  We will need to take this complex situation in stages.   For now, please continue the same Trulicity, and: I have sent a prescription to your pharmacy, to increase the Tresiba to 120 units daily.   Please come back for a follow-up appointment in 6 weeks.

## 2021-05-14 NOTE — Patient Instructions (Addendum)
good diet and exercise significantly improve the control of your diabetes.  please let me know if you wish to be referred to a dietician.  high blood sugar is very risky to your health.  you should see an eye doctor and dentist every year.  It is very important to get all recommended vaccinations.  ?Controlling your blood pressure and cholesterol drastically reduces the damage diabetes does to your body.  Those who smoke should quit.  Please discuss these with your doctor.  ?check your blood sugar twice a day.  vary the time of day when you check, between before the 3 meals, and at bedtime.  also check if you have symptoms of your blood sugar being too high or too low.  please keep a record of the readings and bring it to your next appointment here (or you can bring the meter itself).  You can write it on any piece of paper.  please call us sooner if your blood sugar goes below 70, or if most of your readings are over 200.  ?We will need to take this complex situation in stages.   ?For now, please continue the same Trulicity, and: ?I have sent a prescription to your pharmacy, to increase the Tresiba to 120 units daily.   ?Please come back for a follow-up appointment in 6 weeks.   ?

## 2021-05-16 ENCOUNTER — Telehealth: Payer: Self-pay

## 2021-05-16 NOTE — Chronic Care Management (AMB) (Signed)
? ? ?Chronic Care Management ?Pharmacy Assistant  ? ?Name: Kathleen Hatfield  MRN: [id number] DOB: Jun 26, 1955 ? ? ? ?Reason for Encounter: Diabetes Disease State ?  ? ?Recent office visits:  ?None since last CCM contact. ? ?Recent consult visits:  ?05/14/21-Endocrinology-Sean Ellison,MD-Patient presented for referral for diabetes. increase the [place] to 120 units daily.   ? ?Hospital visits:  ?None in previous 6 months ? ?Medications: ?Outpatient Encounter Medications as of 05/16/2021  ?Medication Sig Note  ? acetaminophen (TYLENOL) 500 MG tablet Take 500 mg by mouth every 6 (six) hours as needed for moderate pain.   ? albuterol (VENTOLIN HFA) 108 (90 Base) MCG/ACT inhaler Inhale 2 puffs into the lungs every 6 (six) hours as needed.   ? BAYER CONTOUR NEXT TEST test strip 1 each by Other route daily. As directed   ? budesonide (PULMICORT) 0.5 MG/2ML nebulizer solution Take 2 mLs (0.5 mg total) by nebulization 2 (two) times daily.   ? buPROPion (WELLBUTRIN XL) 300 MG 24 hr tablet TAKE 1 TABLET BY MOUTH DAILY   ? Cholecalciferol (VITAMIN D-3) 5000 UNITS TABS Take 5,000 Units by mouth every other day.    ? colchicine 0.6 MG tablet Take 1 tablet (0.6 mg total) by mouth daily as needed (for gout).   ? dicyclomine (BENTYL) 20 MG tablet TAKE 1 TABLET UP TO FOUR TIMES A DAY FOR GI CRAMPING, PAIN, NAUSEA, AND VOMITING AS NEEDED   ? DULoxetine (CYMBALTA) 60 MG capsule TAKE 1 CAPSULE BY MOUTH ONCE DAILY.   ? fluticasone (FLONASE) 50 MCG/ACT nasal spray Place 2 sprays into both nostrils daily.   ? furosemide (LASIX) 20 MG tablet Take 3 tablets (60 mg total) by mouth 2 (two) times daily.   ? gabapentin (NEURONTIN) 100 MG capsule TAKE 3 CAPSULES BY MOUTH AT BEDTIME.   ? GLOBAL EASE INJECT PEN NEEDLES 32G X 4 MM MISC USE AS DIRECTED WITH VICTOZA AND TRESIBA.   ? insulin degludec (TRESIBA FLEXTOUCH) 200 UNIT/ML FlexTouch Pen Inject 120 Units into the skin daily.   ? ipratropium-albuterol (DUONEB) 0.5-2.5 (3) MG/3ML SOLN Take 3 mLs by  nebulization every 6 (six) hours as needed (wheezing).   ? Iron, Ferrous Sulfate, 325 (65 Fe) MG TABS Take 325 mg by mouth every other day. 04/28/2021: Patient states she takes sporatically  ? metolazone (ZAROXOLYN) 5 MG tablet Take 1 tablet (5 mg total) by mouth as needed.   ? MICROLET LANCETS MISC 1 each by Other route. As directed   ? nystatin (MYCOSTATIN/NYSTOP) powder Apply 1 application topically 3 (three) times daily.   ? ondansetron (ZOFRAN) 4 MG tablet Take 1 tablet (4 mg total) by mouth every 6 (six) hours. (Patient taking differently: Take 4 mg by mouth every 8 (eight) hours as needed for vomiting or nausea.)   ? oxyCODONE-acetaminophen (PERCOCET/ROXICET) 5-325 MG tablet Take 1 tablet by mouth every 6 (six) hours as needed for severe pain.   ? pantoprazole (PROTONIX) 40 MG tablet Take 1 tablet (40 mg total) by mouth 2 (two) times daily.   ? polyethylene glycol (MIRALAX / GLYCOLAX) packet Take 17 g by mouth daily as needed for mild constipation.   ? potassium chloride (MICRO-K) 10 MEQ CR capsule TAKE 5 CAPSULES (50 MEQ TOTAL) BY MOUTH 2 (TWO) TIMES DAILY   ? promethazine-dextromethorphan (PROMETHAZINE-DM) 6.25-15 MG/5ML syrup Take 2.5 mLs by mouth 4 (four) times daily as needed for cough (sedation caution).   ? propranolol (INDERAL) 10 MG tablet TAKE 1 TABLET BY MOUTH 2 TIMES DAILY   ?  revefenacin (YUPELRI) 175 MCG/3ML nebulizer solution Take 3 mLs (175 mcg total) by nebulization daily.   ? rOPINIRole (REQUIP) 4 MG tablet TAKE 1 TABLET BY MOUTH TWICE DAILY   ? spironolactone (ALDACTONE) 25 MG tablet TAKE 1 TABLET BY MOUTH DAILY   ? traMADol (ULTRAM) 50 MG tablet Take 1 tablet (50 mg total) by mouth 3 (three) times daily as needed.   ? TRULICITY 4.5 DD/2.2GU SOPN INJECT 4.5 MG AS DIRECTED ONCE A WEEK.   ? UNABLE TO FIND CPAP- At bedtime   ? ?No facility-administered encounter medications on file as of 05/16/2021.  ? ? ? ?Recent Relevant Labs: ?Lab Results  ?Component Value Date/Time  ? HGBA1C 9.0 (A)  05/14/2021 03:10 PM  ? HGBA1C 10.9 (H) 02/14/2021 06:59 AM  ? HGBA1C 9.4 (H) 11/28/2020 03:56 PM  ?  ?Kidney Function ?Lab Results  ?Component Value Date/Time  ? CREATININE 1.24 (H) 03/31/2021 06:06 PM  ? CREATININE 1.17 02/27/2021 12:16 PM  ? CREATININE 1.16 09/04/2019 12:00 AM  ? CREATININE 1.3 04/26/2019 12:00 AM  ? GFR 48.76 (L) 02/27/2021 12:16 PM  ? GFRNONAA 48 (L) 03/31/2021 06:06 PM  ? GFRAA 69 01/19/2020 01:56 PM  ? ? ? ?Contacted patient on 05/16/21 to discuss diabetes disease state.  ? ?Current antihyperglycemic regimen:  ? Trulicity 4.5 mg - Inject once weekly (Thursday or Friday)  ?Tresiba - 120 units daily (evening ) ? ?  Patient verbally confirms she is taking the above medications as directed. Yes ? ?What diet changes have been made to improve diabetes control? The patient is limiting sweets and carbs in her diet. ? ?What recent interventions/DTPs have been made to improve glycemic control: Dr.Ellison increased Tresiba to 120 units daily -90ds called into [facility]- we are trying to onboard the patient to [facility]. ?  ? ?Have there been any recent hospitalizations or ED visits since last visit with CPP? No ? ?Patient denies hypoglycemic symptoms, including Pale, Sweaty, Shaky, Hungry, Nervous/irritable, and Vision changes ? ?Patient denies hyperglycemic symptoms, including blurry vision, excessive thirst, fatigue, polyuria, and weakness ? ?How often are you checking your blood sugar? once daily ? ?What are your blood sugars ranging?  ?Fasting: 05/16/21- 101 ?   05/11/21-  140 ?   05/10/21-  145 ?   05/08/21-  136 ?   05/07/21-  146 ? ?During the week, how often does your blood glucose drop below 70? Never ? ?Are you checking your feet daily/regularly? Yes ? ?Adherence Review: ?Is the patient currently on a STATIN medication? No ?Is the patient currently on ACE/ARB medication? No ?Does the patient have >5 day gap between last estimated fill dates? No ? ?Care Gaps: ?Annual wellness visit in last year? Yes ?Most recent  A1C reading:9.0 ?Most Recent BP reading:126/72  84-P ? ?Last eye exam / retinopathy screening:2021 ?Last diabetic foot exam:11/28/20 ? ? ?Star Rating Drugs:  ?Medication:  Last Fill: Day Supply ?Tresiba 200 unit/ml 05/14/21  90 ?Trulicity 5.4YH  0/62/37 28 ? ? ?EXA BOMBA was contacted to remind of upcoming telephone visit with [name] on 05/21/21 at 10:15am. Patient was reminded to have any blood glucose and blood pressure readings available for review at appointment. If unable to reach, a voicemail was left for patient.  ? ? ?Are you having any problems with your medications? No  ? ?Do you have any concerns you like to discuss with the pharmacist? No ? ? ? ?CCM appointment on 05/21/21 ? ?[name], CPP notified ? ?Lutricia Widjaja, CCMA ?  Health concierge  ?[phone] ?

## 2021-05-17 ENCOUNTER — Other Ambulatory Visit: Payer: Self-pay | Admitting: Family Medicine

## 2021-05-19 DIAGNOSIS — E1142 Type 2 diabetes mellitus with diabetic polyneuropathy: Secondary | ICD-10-CM | POA: Diagnosis not present

## 2021-05-19 DIAGNOSIS — J449 Chronic obstructive pulmonary disease, unspecified: Secondary | ICD-10-CM | POA: Diagnosis not present

## 2021-05-19 DIAGNOSIS — Z6841 Body Mass Index (BMI) 40.0 and over, adult: Secondary | ICD-10-CM | POA: Diagnosis not present

## 2021-05-19 DIAGNOSIS — E1165 Type 2 diabetes mellitus with hyperglycemia: Secondary | ICD-10-CM | POA: Diagnosis not present

## 2021-05-19 DIAGNOSIS — Z794 Long term (current) use of insulin: Secondary | ICD-10-CM | POA: Diagnosis not present

## 2021-05-19 DIAGNOSIS — R69 Illness, unspecified: Secondary | ICD-10-CM | POA: Diagnosis not present

## 2021-05-19 DIAGNOSIS — G8929 Other chronic pain: Secondary | ICD-10-CM | POA: Diagnosis not present

## 2021-05-19 DIAGNOSIS — I509 Heart failure, unspecified: Secondary | ICD-10-CM | POA: Diagnosis not present

## 2021-05-19 DIAGNOSIS — G2581 Restless legs syndrome: Secondary | ICD-10-CM | POA: Diagnosis not present

## 2021-05-19 DIAGNOSIS — I110 Hypertensive heart disease with heart failure: Secondary | ICD-10-CM | POA: Diagnosis not present

## 2021-05-20 ENCOUNTER — Other Ambulatory Visit: Payer: Self-pay | Admitting: Family Medicine

## 2021-05-20 DIAGNOSIS — E1165 Type 2 diabetes mellitus with hyperglycemia: Secondary | ICD-10-CM

## 2021-05-21 ENCOUNTER — Telehealth: Payer: Medicare HMO

## 2021-05-22 ENCOUNTER — Other Ambulatory Visit: Payer: Self-pay

## 2021-05-22 ENCOUNTER — Ambulatory Visit (INDEPENDENT_AMBULATORY_CARE_PROVIDER_SITE_OTHER): Payer: Medicare HMO | Admitting: Pharmacist

## 2021-05-22 DIAGNOSIS — E1165 Type 2 diabetes mellitus with hyperglycemia: Secondary | ICD-10-CM

## 2021-05-22 DIAGNOSIS — I10 Essential (primary) hypertension: Secondary | ICD-10-CM

## 2021-05-22 DIAGNOSIS — I5032 Chronic diastolic (congestive) heart failure: Secondary | ICD-10-CM

## 2021-05-22 DIAGNOSIS — G720 Drug-induced myopathy: Secondary | ICD-10-CM

## 2021-05-22 DIAGNOSIS — E782 Mixed hyperlipidemia: Secondary | ICD-10-CM

## 2021-05-22 DIAGNOSIS — J449 Chronic obstructive pulmonary disease, unspecified: Secondary | ICD-10-CM

## 2021-05-22 NOTE — Progress Notes (Signed)
? ?Chronic Care Management ?Pharmacy Note ? ?04/07/2021 ?Name:  Kathleen Hatfield MRN:  [id number] DOB:  1955/10/30 ? ?Summary: CCM F/U visit ?-Pt has seen Endocrinology and A1c improved to 9.0% recently; she still forgets to take Insulin and Trulicity fairly often due to timing in middle of day ?-Fasting BG improved: 101, 165, 117, 148. Pt denies hypoglycemia ?-Pt reports medication adherence has improved with pill packs, she would like to cut down on number of pills wherever possible (Potassium, Gabapentin she takes multiple pills at a time to make her prescribed dose) ?-Pt is due for follow up with Pulmonology. Multiple exacerbations in last 6 months. She is not taking her nebulizers as prescribed due to time (15 min per session). She  has not tolerated powder inhalers (Elliptas) but does ok with MDI (albuterol).  ? ?Recommendations:  ?-Recommend moving insulin and Trulicity to bedtime to improve adherence ?-Recommend consolidating gabapentin dose to 300 mg capsule to reduce pill burden - consulting PCP ?-Recommend trial of Breztri (MDI)- defer to pulmonology ? ?Plan: ?-[place] will call patient monthly for adherence reviews ?-Pharmacist follow up televisit scheduled for 1 month ? ? ? ?Subjective: ?Kathleen Hatfield is an 66 y.o. year old female who is a primary patient of [name], [name], MD.  The CCM team was consulted for assistance with disease management and care coordination needs.   ? ?Engaged with patient by telephone for follow up visit in response to provider referral for pharmacy case management and/or care coordination services.  ? ?Consent to Services:  ?The patient was given information about Chronic Care Management services, agreed to services, and gave verbal consent prior to initiation of services.  Please see initial visit note for detailed documentation.  ? ?Patient Care Team: ?[name], MD as PCP - General (Family Medicine) ?[name], MD as PCP - Cardiology  (Cardiology) ?[name], MD as Consulting Physician (Pulmonary Disease) ?[name], MD as Consulting Physician (General Surgery) ?Harmony, MD as Consulting Physician (Pulmonary Disease) ?Dannielle Karvonen, RN as Case Manager ?Oceania Noori, Cleaster Corin, [facility] as Pharmacist (Pharmacist) ? ?Recent office visits:  ?03/11/21 - [name], MD, PCP - Pt presented for diabetes follow up. Increase insulin by 2 units/day for BP > 150. If you have more ankle swelling take 1 dose of metolazone.  Update me about your breathing and sugar next week.  ?02/27/21 - [name], MD, PCP - Pt presented for hospital follow up. [facility] Kitchen  She was admitted with flu with hyperglycemia exacerbated by steroid use.  Improved in the meantime.  Discussed taking 1 dose of metolazone to see if that helped with her breathing and cough in the meantime.  She can use Tessalon for cough. ?02/07/21 - Alma Friendly, NP - Pt presented for COPD exacerbation. Start doxycycline x 10 days and prednisone 40 mg x 5 days. Adjust insulin by 10 units for BG > 200.   ?11/28/20 - [name], MD, PCP - Pt presented for diabetes follow up and CHF. Stop meloxicam. Updated labs. A1c elevated, Referral to endocrinology. BNP normal, hold metolazone. Low iron, start supplementation.  ?  ?Recent consult visits:  ?05/14/21 Dr [name] (Endocrine):  Establish care DM. Advised mealtime insulin, pt declined. Increased Tresiba to 120 units. ?  ?Hospital visits: ?03/31/21 - ED visit, atypical chest pain ?02/14/21 - 02/18/21 - Admission, COPD exacerbation  ?02/08/21 - ED, COPD exacerbation ?12/12/20 - ED visit, acute gout ? ?Objective: ? ?Lab Results  ?Component Value Date  ?  CREATININE 1.24 (H) 03/31/2021  ? BUN 20 03/31/2021  ? GFR 48.76 (L) 02/27/2021  ? GFRNONAA 48 (L) 03/31/2021  ? GFRAA 69 01/19/2020  ? NA 137 03/31/2021  ? K 3.8 03/31/2021  ? CALCIUM 10.0 03/31/2021  ? CO2 34 (H) 03/31/2021  ? GLUCOSE 210 (H) 03/31/2021  ? ? ?Lab Results  ?Component Value  Date/Time  ? HGBA1C 9.0 (A) 05/14/2021 03:10 PM  ? HGBA1C 10.9 (H) 02/14/2021 06:59 AM  ? HGBA1C 9.4 (H) 11/28/2020 03:56 PM  ? GFR 48.76 (L) 02/27/2021 12:16 PM  ? GFR 38.07 (L) 11/28/2020 03:56 PM  ?  ?Last diabetic Eye exam:  ?Lab Results  ?Component Value Date/Time  ? HMDIABEYEEXA No Retinopathy 12/22/2019 12:00 AM  ?  ?Last diabetic Foot exam: completed by PCP 11/28/2020 - normal   ? ?Lab Results  ?Component Value Date  ? CHOL 333 04/26/2019  ? HDL 34 (L) 03/25/2013  ? LDLCALC 133 (H) 03/25/2013  ? TRIG 469 (A) 04/26/2019  ? CHOLHDL 6.9 03/25/2013  ? ? ?Hepatic Function Latest Ref Rng & Units 03/31/2021 11/28/2020 09/20/2020  ?Total Protein 6.5 - 8.1 g/dL 7.3 7.0 7.2  ?Albumin 3.5 - 5.0 g/dL 3.5 3.8 3.3(L)  ?AST 15 - 41 U/L 24 18 21   ?ALT 0 - 44 U/L 16 15 23   ?Alk Phosphatase 38 - 126 U/L 88 92 83  ?Total Bilirubin 0.3 - 1.2 mg/dL 1.4(H) 0.3 0.4  ?Bilirubin, Direct 0.0 - 0.2 mg/dL 0.4(H) - -  ? ? ?Lab Results  ?Component Value Date/Time  ? TSH 2.850 06/09/2019 12:50 PM  ? TSH 2.31 04/15/2015 03:45 PM  ? ? ?CBC Latest Ref Rng & Units 03/31/2021 02/27/2021 02/16/2021  ?WBC 4.0 - 10.5 K/uL 9.7 10.6(H) 16.1(H)  ?Hemoglobin 12.0 - 15.0 g/dL 13.6 13.6 13.7  ?Hematocrit 36.0 - 46.0 % 41.6 41.9 42.3  ?Platelets 150 - 400 K/uL 328 360.0 259  ? ? ?Lab Results  ?Component Value Date/Time  ? VD25OH 45.52 06/18/2020 04:52 PM  ? VD25OH 47.60 06/22/2019 01:59 PM  ? ? ?Clinical ASCVD: No  ?The ASCVD Risk score (Arnett DK, et al., 2019) failed to calculate for the following reasons: ?  The valid total cholesterol range is 130 to 320 mg/dL   ? ?Depression screen [facility] 2/9 08/12/2020 12/28/2017 09/13/2017  ?Decreased Interest 3 1 3   ?Down, Depressed, Hopeless 1 1 3   ?PHQ - 2 Score 4 2 6   ?Altered sleeping 2 3 3   ?Tired, decreased energy 3 3 3   ?Change in appetite 2 0 1  ?Feeling bad or failure about yourself  3 3 3   ?Trouble concentrating 3 0 3  ?Moving slowly or fidgety/restless 3 3 3   ?Suicidal thoughts 0 0 0  ?PHQ-9 Score 20 14 22    ?Difficult doing work/chores Somewhat difficult Very difficult Very difficult  ?Some recent data might be hidden  ?  ?Social History  ? ?Tobacco Use  ?Smoking Status Never  ?Smokeless Tobacco Never  ? ?BP Readings from Last 3 Encounters:  ?05/14/21 126/72  ?03/31/21 118/65  ?03/11/21 120/84  ? ?Pulse Readings from Last 3 Encounters:  ?05/14/21 84  ?03/31/21 60  ?03/11/21 87  ? ?Wt Readings from Last 3 Encounters:  ?05/14/21 255 lb 6.4 oz (115.8 kg)  ?03/31/21 260 lb 2.3 oz (118 kg)  ?03/11/21 260 lb (117.9 kg)  ? ?BMI Readings from Last 3 Encounters:  ?05/14/21 42.50 kg/m?  ?03/31/21 43.29 kg/m?  ?03/11/21 43.27 kg/m?  ? ?Iron/TIBC/Ferritin/ %Sat ?   ?Component Value  Date/Time  ? IRON 37 (L) 11/28/2020 1556  ? IRON 66 09/19/2019 0000  ? TIBC 430 09/19/2019 0000  ? FERRITIN 64.4 11/28/2020 1556  ? FERRITIN 50 09/19/2019 0000  ? IRONPCTSAT 15 09/19/2019 0000  ? ? ?Assessment/Interventions: Review of patient past medical history, allergies, medications, health status, including review of consultants reports, laboratory and other test data, was performed as part of comprehensive evaluation and provision of chronic care management services.  ? ?SDOH:  (Social Determinants of Health) assessments and interventions performed: Yes ? ?SDOH Screenings  ? ?Alcohol Screen: Not on file  ?Depression (PHQ2-9): Medium Risk  ? PHQ-2 Score: 20  ?Financial Resource Strain: Low Risk   ? Difficulty of Paying Living Expenses: Not very hard  ?Food Insecurity: No Food Insecurity  ? Worried About Charity fundraiser in the Last Year: Never true  ? Ran Out of Food in the Last Year: Never true  ?Housing: Not on file  ?Physical Activity: Inactive  ? Days of Exercise per Week: 0 days  ? Minutes of Exercise per Session: 0 min  ?Social Connections: Not on file  ?Stress: Not on file  ?Tobacco Use: Low Risk   ? Smoking Tobacco Use: Never  ? Smokeless Tobacco Use: Never  ? Passive Exposure: Not on file  ?Transportation Needs: No Transportation Needs   ? Lack of Transportation (Medical): No  ? Lack of Transportation (Non-Medical): No  ? ? ?CCM Care Plan ? ?Allergies  ?Allergen Reactions  ? Almond Oil Anaphylaxis, Shortness Of Breath and Swelling  ? Morphine And Related Shortness O

## 2021-05-23 ENCOUNTER — Telehealth: Payer: Self-pay | Admitting: Pharmacist

## 2021-05-23 DIAGNOSIS — R110 Nausea: Secondary | ICD-10-CM

## 2021-05-23 MED ORDER — GABAPENTIN 300 MG PO CAPS
300.0000 mg | ORAL_CAPSULE | Freq: Every day | ORAL | 1 refills | Status: DC
Start: 2021-05-23 — End: 2021-11-26

## 2021-05-23 NOTE — Patient Instructions (Signed)
Visit Information ? ?Phone number for Pharmacist: [phone] ? ? Goals Addressed   ? ?  ?  ?  ?  ? This Visit's Progress  ?  Manage My Medicine     ?  Timeframe:  Long-Range Goal ?Priority:  High ?Start Date:        05/23/21                     ?Expected End Date:   05/24/22                   ? ?Follow Up Date April 2023 ?  ?- call for medicine refill 2 or 3 days before it runs out ?- call if I am sick and can't take my medicine ?- keep a list of all the medicines I take; vitamins and herbals too ?-Utilize [facility] for medication synchronization, packaging and delivery ?  ?  ?Why is this important?   ?These steps will help you keep on track with your medicines. ?  ?Notes:  ?  ? ?  ? ? ?Care Plan : [place]  ?Updates made by [name], RPH since 05/23/2021 12:00 AM  ?  ? ?Problem: Hypertension, Hyperlipidemia, Diabetes, Heart Failure, COPD, Asthma, Chronic Kidney Disease, and Depression   ?Priority: High  ?  ? ?Long-Range Goal: Disease Management   ?Start Date: 07/29/2020  ?Expected End Date: 05/24/2022  ?This Visit's Progress: Not on track  ?Recent Progress: Not on track  ?Priority: High  ?Note:   ?Current Barriers:  ?Unable to achieve control of diabetes  ?Unable to achieve control COPD, frequent exacerbations ?Does not maintain contact with specialists  ?Difficulty taking medications as prescribed ? ?Pharmacist Clinical Goal(s):  ?Patient will contact provider office for questions/concerns as evidenced notation of same in electronic health record through collaboration with PharmD and provider.  ? ?Interventions: ?1:1 collaboration with [name], MD regarding development and update of comprehensive plan of care as evidenced by provider attestation and co-signature ?Inter-disciplinary care team collaboration (see longitudinal plan of care) ?Comprehensive medication review performed; medication list updated in electronic medical record ? ?Hyperlipidemia: (LDL goal < 100) ?-Query  controlled - last lipid panel 04/2019 with elevated TRIG (469), LDL unable to be calculated. ?-Current treatment: ?None ?-Medications previously tried: Multiple statins (atorvastatin, others) - leg weakness ?-Educated on Cholesterol goals;  ?-Recommended diabetes control to reduce cardiovascular risk. Patient is due for updated lipid panel. ? ?Diabetes (A1c goal <7%) ?-Uncontrolled, improving- A1c 9.0 (05/2021), pt endorses improvement in BG on higher dose of insulin and Trulicity. She does report she forgets insulin and Trulicity doses sometimes. ?-Follows with Dr [name] (established care 05/2021). Dx 2017, insulin since 2020. Hx of chronic UTIs, would avoid SGLT2-inhibitor ?-Fasting BG: 101, 165, 117, 148. Denies hypoglycemia < 70 ?-Current medications: ?Trulicity 4.5 mg - Inject once weekly (Thurs) - Appropriate, Query Effective ?Tresiba 120 units daily (midday) -Appropriate, Query Effective ?-Medications previously tried: Jardiance, Victoza, metformin ?-Current meal patterns: The patient reports she is on a salt free, sugar free, gluten free diet but had a hard time sticking to this. She tries to limit portions.  ?-Recommend to take [place] and Trulicity at bedtime to improve adherence ? ?Heart Failure (Goal: manage symptoms and prevent exacerbations) ?-Controlled - per pt report of symptoms. She has not been checking her weight regularly; pt has previously had trouble swallowing large tablets but does well with Potassium 10 mEq capsules. She does want to consolidate some her  pills if able ?-Followed by Dr. [name] with [place] ?-Last ejection fraction: 70% normal (Date: 03/21) ?-HF type: Diastolic; NYHA Class: II (slight limitation of activity) ?-Current treatment: ?Spironolactone 25 mg daily AM - Appropriate, Effective, Safe, Accessible ?Furosemide 20 mg - 3 tablets at breakfast and noon - Appropriate, Effective, Safe, Accessible ?Potassium 10 mEq - 4 AM, 2 lunch, 4 PM - Appropriate, Effective,  Safe, Accessible ?Metolazone - take 1 tablet as needed for weight gain of 3 pounds in 24 hours or 5 pounds in 1 week - Appropriate, Effective, Safe, Accessible ?-Medications previously tried: torsemide, bumetanide, losartan  ?-Recommended to continue current medication; ? ?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) ?-Not ideally controlled - pt has had multiple hospitalizations for COPD exacerbation; she often does not take her nebulizers as prescribed due to time constraints and just forgetting ?-Followed by Dr Halford Chessman, [facility] Pulmonology. Pt does not follow up with pulmonology as frequently as recommended. She has had multiple exacerbations this year. Pt reports taking albuterol/ipratropium multiple times daily. Most days uses Pulmicort once. Not taking Yupelri as often as recommended. We discussed maintenance inhaler/rescue and importance of taking as prescribed. Again reviewed her inhaler history. She reports DPI did not get into her lungs but she has not tried any MDIs. Judithann Sauger may be a good option for her to reduce her time using nebulizers daily (15 min x each treatment). She is currently wearing oxgen since December hospitalization. She is audibly very winded without the oxygen. ?-Pulmonary function testing: 2021 FEV1 42% predicted ?-Gold Grade: Gold 3 (FEV1 30-49%) ?-Current COPD Classification:  E (high sx, >2 exacerbations/yr) ?-Exacerbations requiring treatment in last 6 months: yes, multiple ?-Compliant with CPAP nightly ?-Maintenance treatment: ?Pulmicort nebuilzer BID - Appropriate, Query Effective ?Yupelri nebulizer daily -Appropriate, Query Effective ?-Rescue: ?Albuterol/ipratropium nebulizer - takes 2-3 times per day as needed - Appropriate, Effective, Safe, Accessible ?Albuterol inhaler - 2 puffs every 6 hours PRN - Appropriate, Effective, Safe, Accessible ?-Medications previously tried: per pt inhalers less effective than nebulizers, Perforomist was unaffordable; She  was previously on Advair, Incruse and most recently Trelegy but says she has stopped these because she does not like the powder inhalers. ?-Patient reports consistent use of maintenance nebulizers. It is difficult to complete each nebulizer treatment due to time ?-Frequency of rescue inhaler use: 2-3 times daily  ?-Recommended to continue current medication; Recommend schedule follow up with pulmonology - if not discussed before, may suggest trial of Breztri since this is an MDI and not a dry powdered inhaler. ? ?Depression/Anxiety/Pain(Goal: Control symptoms) ?-Controlled, stable per patient; she asked about changing gabapentin to 300 mg dose due to pill burden ?-Current treatment: ?Bupropion 300 mg daily - Appropriate, Effective, Safe, Accessible ?Cymbalta 60 mg daily - Appropriate, Effective, Safe, Accessible ?Gabapentin 100 mg - 3 caps HS - Appropriate, Effective, Safe, Accessible ?Ropinirole 4 mg BID (RLS) - Appropriate, Effective, Query Safe ?-Medications previously tried/failed: none ?-Concern that ropinirole may be causing her to fall asleep suddenly during the day. This has been discussed with patient before and per chart records, she was unable to tolerate dose reductions. We will revisit this issue periodically ?-Recommend changing gabapentin to 300 mg capsule to reduce pill burden ? ?Patient Goals/Self-Care Activities ?Patient will:  ?- work with pharmacist to improve medication adherence through pill packaging ?-Make appt with pulmonology; consider Breztri inhaler (MDI more helpful than powder inhalers per patient) ?  ?  ? ?Patient verbalizes understanding of instructions and care plan provided  today and agrees to view in [place]. Active MyChart status confirmed with patient.   ?Telephone follow up appointment with pharmacy team member scheduled for: 1 month ? ?[name], PharmD, BCACP ?Clinical Pharmacist ?[facility] ?[phone] ?  ?

## 2021-05-23 NOTE — Telephone Encounter (Signed)
Sent. Thanks.  ?Please update patient.  ?

## 2021-05-23 NOTE — Telephone Encounter (Signed)
Patient is currently taking gabapentin 100 mg - 3 capsules every night. She has high pill burden and requests changing to 300 mg capsule.  ? ?Forwarding to PCP for Rx. ? ?Pharmacy: ?[facility] - [place], [place] - [address] ?[address] ?[place] [address] ?Phone: [phone] Fax: [phone] ? ? ?

## 2021-05-25 ENCOUNTER — Other Ambulatory Visit: Payer: Self-pay | Admitting: Pulmonary Disease

## 2021-05-26 NOTE — Telephone Encounter (Signed)
Received call from patient. She requests a refill for Ondansetron 4 mg. Per chart, this was prescribed last year by Urgent Care. She uses it rarely for nausea but likes to have some on hand. ? ?Routing to PCP for approval. ?

## 2021-05-27 MED ORDER — ONDANSETRON HCL 4 MG PO TABS: 4.0000 mg | ORAL_TABLET | Freq: Three times a day (TID) | ORAL | 1 refills | Status: DC | PRN

## 2021-05-27 NOTE — Telephone Encounter (Signed)
Sent. Thanks.   

## 2021-05-27 NOTE — Addendum Note (Signed)
Addended by: [name] on: 05/27/2021 09:10 AM ? ? Modules accepted: Orders ? ?

## 2021-06-03 ENCOUNTER — Telehealth: Payer: Self-pay

## 2021-06-03 ENCOUNTER — Ambulatory Visit: Payer: Medicare HMO

## 2021-06-03 DIAGNOSIS — J449 Chronic obstructive pulmonary disease, unspecified: Secondary | ICD-10-CM

## 2021-06-03 DIAGNOSIS — I5032 Chronic diastolic (congestive) heart failure: Secondary | ICD-10-CM

## 2021-06-03 DIAGNOSIS — E119 Type 2 diabetes mellitus without complications: Secondary | ICD-10-CM

## 2021-06-03 NOTE — Patient Instructions (Signed)
Visit Information ? ?Thank you for taking time to visit with me today. Please don't hesitate to contact me if I can be of assistance to you before our next scheduled telephone appointment. ? ?Following are the goals we discussed today:  ?Call pharmacy for medication refills 3-7 days in advance of running out of medications.  Contact practice pharmacist, Debbora Dus if you have questions/ concerns regarding your medications.  ? - Continue to take your medications as prescribed and rinse mouth out after use on inhalers ?- keep follow-up appointments with your providers ?-Identify and avoid symptom triggers, such as second hand smoke, virus, weather change, emotional upset, heavy exercise,  and environmental allergens that could exacerbate your COPD or asthma.  ?- Continue to wear your CPAP machine nightly and use your oxygen as advised by your doctor.  ?-Schedule follow up appointments with your pulmonologist  and urologist ?- Check blood sugars daily.   Check blood sugar if you feel it is too high or too low and take your blood sugar readings or monitor to your doctor appointment.  ?- Plan to eat low carbohydrate and low salt meals, watch portion sizes and avoid sugar sweetened drinks.  ? ?Our next appointment is by telephone on 07/31/21 at 2:00 pm ? ?Please call the care guide team at [phone] if you need to cancel or reschedule your appointment.  ? ?If you are experiencing a Mental Health or [place] or need someone to talk to, please call the Suicide and Crisis Lifeline: 988 ?call 1-800-273-TALK (toll free, 24 hour hotline)  ? ?Patient verbalizes understanding of instructions and care plan provided today and agrees to view in [place]. Active MyChart status confirmed with patient.   ? ?Quinn Plowman RN,BSN,CCM ?RN Case Manager ?[place]  ?[phone] ? ?

## 2021-06-03 NOTE — Chronic Care Management (AMB) (Signed)
?Chronic Care Management  ? ?CCM RN Visit Note ? ?06/03/2021 ?Name: Kathleen Hatfield MRN: [id number] DOB: 07/14/1955 ? ?Subjective: ?Kathleen Hatfield is a 66 y.o. year old female who is a primary care patient of [name], MD. The care management team was consulted for assistance with disease management and care coordination needs.   ? ?Engaged with patient by telephone for follow up visit in response to provider referral for case management and/or care coordination services.  ? ?Consent to Services:  ?The patient was given information about Chronic Care Management services, agreed to services, and gave verbal consent prior to initiation of services.  Please see initial visit note for detailed documentation.  ? ?Patient agreed to services and verbal consent obtained.  ? ?Assessment: Review of patient past medical history, allergies, medications, health status, including review of consultants reports, laboratory and other test data, was performed as part of comprehensive evaluation and provision of chronic care management services.  ? ?SDOH (Social Determinants of Health) assessments and interventions performed:   ? ?CCM Care Plan ? ?Allergies  ?Allergen Reactions  ? Almond Oil Anaphylaxis, Shortness Of Breath and Swelling  ? Morphine And Related Shortness Of Breath and Other (See Comments)  ?  Pt. States while in the hospital it affected her breathing, O2 dropped to the 70's  ? Atorvastatin Other (See Comments)  ?  Leg weakness  ? Ceclor [Cefaclor] Nausea And Vomiting  ? Sulfa Antibiotics Nausea And Vomiting  ? Ciprofloxacin Other (See Comments)  ?  Makes joints and muscles ache  ? Levaquin [Levofloxacin] Other (See Comments)  ?  Body aches  ? Losartan Other (See Comments)  ?  Weakness ?  ? Statins Other (See Comments)  ?  Leg and body weakness  ? ? ?Outpatient Encounter Medications as of 06/03/2021  ?Medication Sig Note  ? revefenacin (YUPELRI) 175 MCG/3ML nebulizer solution Inhale one vial in nebulizer once daily. Do  not mix with other nebulized medications.   ? acetaminophen (TYLENOL) 500 MG tablet Take 500 mg by mouth every 6 (six) hours as needed for moderate pain.   ? albuterol (VENTOLIN HFA) 108 (90 Base) MCG/ACT inhaler Inhale 2 puffs into the lungs every 6 (six) hours as needed.   ? BAYER CONTOUR NEXT TEST test strip 1 each by Other route daily. As directed   ? budesonide (PULMICORT) 0.5 MG/2ML nebulizer solution Take 2 mLs (0.5 mg total) by nebulization 2 (two) times daily.   ? buPROPion (WELLBUTRIN XL) 300 MG 24 hr tablet TAKE 1 TABLET BY MOUTH DAILY   ? Cholecalciferol (VITAMIN D-3) 5000 UNITS TABS Take 5,000 Units by mouth every other day.    ? colchicine 0.6 MG tablet TAKE 1 TABLET (0.6 MG TOTAL) BY MOUTH DAILY AS NEEDED (FOR GOUT).   ? dicyclomine (BENTYL) 20 MG tablet TAKE 1 TABLET UP TO FOUR TIMES A DAY FOR GI CRAMPING, PAIN, NAUSEA, AND VOMITING AS NEEDED   ? DULoxetine (CYMBALTA) 60 MG capsule TAKE 1 CAPSULE BY MOUTH ONCE DAILY.   ? fluticasone (FLONASE) 50 MCG/ACT nasal spray Place 2 sprays into both nostrils daily.   ? furosemide (LASIX) 20 MG tablet Take 3 tablets (60 mg total) by mouth 2 (two) times daily.   ? gabapentin (NEURONTIN) 300 MG capsule Take 1 capsule (300 mg total) by mouth at bedtime.   ? GLOBAL EASE INJECT PEN NEEDLES 32G X 4 MM MISC USE AS DIRECTED WITH VICTOZA AND TRESIBA.   ? ipratropium-albuterol (DUONEB) 0.5-2.5 (3) MG/3ML SOLN  Take 3 mLs by nebulization every 6 (six) hours as needed (wheezing).   ? Iron, Ferrous Sulfate, 325 (65 Fe) MG TABS Take 325 mg by mouth every other day. 04/28/2021: Patient states she takes sporatically  ? metolazone (ZAROXOLYN) 5 MG tablet Take 1 tablet (5 mg total) by mouth as needed.   ? MICROLET LANCETS MISC 1 each by Other route. As directed   ? nystatin (MYCOSTATIN/NYSTOP) powder Apply 1 application topically 3 (three) times daily.   ? ondansetron (ZOFRAN) 4 MG tablet Take 1 tablet (4 mg total) by mouth every 8 (eight) hours as needed for vomiting or nausea.    ? oxyCODONE-acetaminophen (PERCOCET/ROXICET) 5-325 MG tablet Take 1 tablet by mouth every 6 (six) hours as needed for severe pain.   ? pantoprazole (PROTONIX) 40 MG tablet Take 1 tablet (40 mg total) by mouth 2 (two) times daily.   ? polyethylene glycol (MIRALAX / GLYCOLAX) packet Take 17 g by mouth daily as needed for mild constipation.   ? potassium chloride (MICRO-K) 10 MEQ CR capsule TAKE 5 CAPSULES (50 MEQ TOTAL) BY MOUTH 2 (TWO) TIMES DAILY   ? promethazine-dextromethorphan (PROMETHAZINE-DM) 6.25-15 MG/5ML syrup Take 2.5 mLs by mouth 4 (four) times daily as needed for cough (sedation caution).   ? propranolol (INDERAL) 10 MG tablet TAKE 1 TABLET BY MOUTH 2 TIMES DAILY   ? rOPINIRole (REQUIP) 4 MG tablet TAKE 1 TABLET BY MOUTH TWICE DAILY   ? spironolactone (ALDACTONE) 25 MG tablet TAKE 1 TABLET BY MOUTH DAILY   ? traMADol (ULTRAM) 50 MG tablet Take 1 tablet (50 mg total) by mouth 3 (three) times daily as needed.   ? TRESIBA FLEXTOUCH 200 UNIT/ML FlexTouch Pen INJECT 108 UNITS UNDER THE SKIN ONCE DAILY 06/03/2021: Patient states she is taking 120 units daily  ? TRULICITY 4.5 ZD/6.3OV SOPN INJECT 4.5 MG AS DIRECTED ONCE A WEEK.   ? UNABLE TO FIND CPAP- At bedtime   ? ?No facility-administered encounter medications on file as of 06/03/2021.  ? ? ?Patient Active Problem List  ? Diagnosis Date Noted  ? Hyperglycemia 02/14/2021  ? Rash 09/27/2020  ? COPD exacerbation ([place]) 09/21/2020  ? Acute exacerbation of COPD with asthma ([place]) 09/19/2020  ? Mixed diabetic hyperlipidemia associated with type 2 diabetes mellitus ([place]) 09/19/2020  ? Pain of foot 06/20/2020  ? Medicare annual wellness visit, initial 06/20/2020  ? Constipation 06/11/2020  ? Diverticulosis of colon 06/11/2020  ? Irritable bowel syndrome 06/11/2020  ? Periumbilical pain 56/43/3295  ? RLS (restless legs syndrome)   ? Acute asthma exacerbation 01/09/2020  ? Asthma exacerbation 01/08/2020  ? Muscle weakness 01/08/2020  ? Grade I diastolic dysfunction  18/84/1660  ? 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 Advance care planning 06/14/2019  ? RLQ abdominal pain 06/14/2019  ? COPD (chronic obstructive pulmonary disease) ([place]) 06/14/2019  ? Leukocytosis 06/14/2019  ? Hypercalcemia 06/14/2019  ? Gout 06/14/2019  ? Type 2 diabetes mellitus with hyperglycemia, without long-term current use of insulin ([place])   ? COPD with acute exacerbation ([place]) 05/03/2017  ? Acute renal failure superimposed on stage 2 chronic kidney disease ([place]) 04/02/2016  ? Diabetes mellitus without complication ([place])   ? CKD (chronic kidney disease)   ? Hypersomnia with sleep apnea 09/16/2015  ? Fatigue 06/07/2015  ? Morbid obesity ([place]) 06/07/2015  ? Chronic diastolic heart failure ([place])   ? Pneumonia of both lower lobes due to infectious organism 02/09/2013  ? OSA on CPAP 08/23/2012  ? Essential hypertension   ? Depression   ?  GERD without esophagitis   ? Hyperlipidemia   ? Ulcerative colitis ([place])   ? Patellar tendinitis 05/25/2011  ? Knee pain 05/25/2011  ? Myofascial pain 05/25/2011  ? Cervicalgia 05/25/2011  ? ? ?Conditions to be addressed/monitored:CHF, COPD, and DMII ? ?Care Plan : RNCM plan of care  ?Updates made by Dannielle Karvonen, RN since 06/03/2021 12:00 AM  ?  ? ?Problem: chronic disease management, education and/ or care coordination   ?Priority: High  ?  ? ?Long-Range Goal: Development of plan of care to address chronic disease management and / or care coordination needs.   ?Start Date: 02/07/2021  ?Expected End Date: 07/04/2021  ?Priority: High  ?Note:   ?Current Barriers:  ?Knowledge Deficits related to plan of care for management of COPD and DMII  ?Chronic Disease Management support and education needs related to COPD and DMII ?Patient states she is doing well.  Patient reports having her initial visit with the endocrinologist on 05/14/21.   She reports her her tresiba was increased to 120 units / day. She states her blood sugars have come down since her medication adjustment. She reports  her blood sugars have ranged from 101 to 165.  Patient reports she is not receiving her medications in pill packets from [facility] home delivery.  Patient reports her shortness of breath has improved. Patient

## 2021-06-03 NOTE — Chronic Care Management (AMB) (Signed)
? ? ?Chronic Care Management ?Pharmacy Assistant  ? ?Name: Kathleen Hatfield  MRN: [id number] DOB: 09/29/1955 ? ? ?Reason for Encounter: Medication Adherence and Delivery Coordination ?  ? ? ?Recent office visits:  ?06/03/21-Family Medicine-RN -Telephone encounter-discussed diet,exercise,monitor BG's-no medicine changes. ? ?Recent consult visits:  ?None since lat CCM contact ? ?Hospital visits:  ?None in previous 6 months ? ?Medications: ?Outpatient Encounter Medications as of 06/03/2021  ?Medication Sig Note  ? revefenacin (YUPELRI) 175 MCG/3ML nebulizer solution Inhale one vial in nebulizer once daily. Do not mix with other nebulized medications.   ? acetaminophen (TYLENOL) 500 MG tablet Take 500 mg by mouth every 6 (six) hours as needed for moderate pain.   ? albuterol (VENTOLIN HFA) 108 (90 Base) MCG/ACT inhaler Inhale 2 puffs into the lungs every 6 (six) hours as needed.   ? BAYER CONTOUR NEXT TEST test strip 1 each by Other route daily. As directed   ? budesonide (PULMICORT) 0.5 MG/2ML nebulizer solution Take 2 mLs (0.5 mg total) by nebulization 2 (two) times daily.   ? buPROPion (WELLBUTRIN XL) 300 MG 24 hr tablet TAKE 1 TABLET BY MOUTH DAILY   ? Cholecalciferol (VITAMIN D-3) 5000 UNITS TABS Take 5,000 Units by mouth every other day.    ? colchicine 0.6 MG tablet TAKE 1 TABLET (0.6 MG TOTAL) BY MOUTH DAILY AS NEEDED (FOR GOUT).   ? dicyclomine (BENTYL) 20 MG tablet TAKE 1 TABLET UP TO FOUR TIMES A DAY FOR GI CRAMPING, PAIN, NAUSEA, AND VOMITING AS NEEDED   ? DULoxetine (CYMBALTA) 60 MG capsule TAKE 1 CAPSULE BY MOUTH ONCE DAILY.   ? fluticasone (FLONASE) 50 MCG/ACT nasal spray Place 2 sprays into both nostrils daily.   ? furosemide (LASIX) 20 MG tablet Take 3 tablets (60 mg total) by mouth 2 (two) times daily.   ? gabapentin (NEURONTIN) 300 MG capsule Take 1 capsule (300 mg total) by mouth at bedtime.   ? GLOBAL EASE INJECT PEN NEEDLES 32G X 4 MM MISC USE AS DIRECTED WITH VICTOZA AND TRESIBA.   ? ipratropium-albuterol  (DUONEB) 0.5-2.5 (3) MG/3ML SOLN Take 3 mLs by nebulization every 6 (six) hours as needed (wheezing).   ? Iron, Ferrous Sulfate, 325 (65 Fe) MG TABS Take 325 mg by mouth every other day. 04/28/2021: Patient states she takes sporatically  ? metolazone (ZAROXOLYN) 5 MG tablet Take 1 tablet (5 mg total) by mouth as needed.   ? MICROLET LANCETS MISC 1 each by Other route. As directed   ? nystatin (MYCOSTATIN/NYSTOP) powder Apply 1 application topically 3 (three) times daily.   ? ondansetron (ZOFRAN) 4 MG tablet Take 1 tablet (4 mg total) by mouth every 8 (eight) hours as needed for vomiting or nausea.   ? oxyCODONE-acetaminophen (PERCOCET/ROXICET) 5-325 MG tablet Take 1 tablet by mouth every 6 (six) hours as needed for severe pain.   ? pantoprazole (PROTONIX) 40 MG tablet Take 1 tablet (40 mg total) by mouth 2 (two) times daily.   ? polyethylene glycol (MIRALAX / GLYCOLAX) packet Take 17 g by mouth daily as needed for mild constipation.   ? potassium chloride (MICRO-K) 10 MEQ CR capsule TAKE 5 CAPSULES (50 MEQ TOTAL) BY MOUTH 2 (TWO) TIMES DAILY   ? promethazine-dextromethorphan (PROMETHAZINE-DM) 6.25-15 MG/5ML syrup Take 2.5 mLs by mouth 4 (four) times daily as needed for cough (sedation caution).   ? propranolol (INDERAL) 10 MG tablet TAKE 1 TABLET BY MOUTH 2 TIMES DAILY   ? rOPINIRole (REQUIP) 4 MG tablet TAKE 1  TABLET BY MOUTH TWICE DAILY   ? spironolactone (ALDACTONE) 25 MG tablet TAKE 1 TABLET BY MOUTH DAILY   ? traMADol (ULTRAM) 50 MG tablet Take 1 tablet (50 mg total) by mouth 3 (three) times daily as needed.   ? TRESIBA FLEXTOUCH 200 UNIT/ML FlexTouch Pen INJECT 108 UNITS UNDER THE SKIN ONCE DAILY   ? TRULICITY 4.5 NK/5.3ZJ SOPN INJECT 4.5 MG AS DIRECTED ONCE A WEEK.   ? UNABLE TO FIND CPAP- At bedtime   ? ?No facility-administered encounter medications on file as of 06/03/2021.  ? ?BP Readings from Last 3 Encounters:  ?05/14/21 126/72  ?03/31/21 118/65  ?03/11/21 120/84  ?  ?Lab Results  ?Component Value Date  ?  HGBA1C 9.0 (A) 05/14/2021  ?  ? ? ?No OVs, Consults, or hospital visits since last care coordination call / Pharmacist visit. ?No medication changes indicated ? ? ?Last adherence delivery date:05/13/21     ? ?Patient is due for next adherence delivery on: 06/12/21 ? ?Spoke with patient on 06/04/21 reviewed medications and coordinated delivery. ? ?This delivery to include: Adherence Packaging  30 Days  ?Packs: ?Bupropion HCL XL 346m- take 1 tablet at evening meal ?Cholecalciferol (vit D3)-5000units- take 1 tablet breakfast every other day  ?Ropinirole 435m take 1 tablet breakfast 1 tablet bedtime  ?Pantoprazole 4031mtake 1 tablet bedtime ?Spironolactone 34m79make 1 tablet breakfast ?Propanolol 10mg30mke 1 tablet breakfast 1 tablet evening meal  ?Furosemide 20mg 14m 3 tablets breakfast 3 tablet lunchtime ?Potassium chloride ER- take 4 tablets breakfast 2 tablets lunch 4 tablets evening meal ?Duloxetine 60mg- 72m 1 tablet breakfast ?Ferrous Sulfate 334mg- t73m1 tablet evening meal every other day  ?Gabapentin 300mg-tak34mtablet bedtime ? ?VIAL medications: ?Ondansetron 4mg - tak50m tablet every 8 hours as needed for nausea  ? ? ?Patient declined the following medications this month: ?-Mesalamine 1.2gm -take 1 tablet lunchtime ?-Tresiba [place] 30 day supply 04/07/21) ?-Trulicity 4.5 mg on Thurs or Friday (filled 30 day supply 04/07/21) ?-Bentyl - uses as needed for flares ?-Tylenol - 1 before bedtime ?-Meloxicam 15 mg - short term for arthritis in ankle ?-Metolazone - for fluid overload  ?-Tramadol - PRN diverticulitis ? ?Any concerns about your medications? No ? ?How often do you forget or accidentally miss a dose? Rarely ? ?Do you use a pillbox? No ? ?Is patient in packaging Yes ? What is the date on your next pill pack? The end date on her box is 06/14/21 ? Any concerns or issues with your packaging? The patient did comment that the packs are difficult to pull apart - sometimes the next pack gets torn in  process of tearing off . ? ?Refills requested from providers include: ? Furosemide, spironolactone, pantoprazole-Albuterol ? ?Confirmed delivery date of 06/12/21, advised patient that pharmacy will contact them the morning of delivery. ? ?Recent blood pressure readings are as follows: none available ? ? ?Recent blood glucose readings are as follows: ?Fasting: 06/04/21-112 ?    06/03/21-125 ?    06/02/21-114 ?    06/01/21-115 ?    05/29/21-105 ?    05/28/21-123 ? ? ?The patient is asking for a refill on Albuterol, and would like a RX for an allergy pill? ? ?Annual wellness visit in last year? Yes ?Most Recent BP reading: 126/72  84-P  05/14/21 ? ?If Diabetic: ?Most recent A1C reading:9.0  05/14/21 ?Last eye exam / retinopathy screening:2021 ?Last diabetic foot exam:UTD ? ?[facility]ene Brooke [facility] ? ?Benen Weida, CCMA ?  Clincal Pharmacy Assistant ?[phone] ? ? ?

## 2021-06-04 ENCOUNTER — Encounter: Payer: Self-pay | Admitting: Physician Assistant

## 2021-06-04 ENCOUNTER — Ambulatory Visit: Payer: Medicare HMO | Admitting: Physician Assistant

## 2021-06-04 ENCOUNTER — Other Ambulatory Visit: Payer: Self-pay | Admitting: *Deleted

## 2021-06-04 VITALS — BP 108/60 | HR 70 | Ht 65.0 in | Wt 256.2 lb

## 2021-06-04 DIAGNOSIS — E782 Mixed hyperlipidemia: Secondary | ICD-10-CM

## 2021-06-04 DIAGNOSIS — E1169 Type 2 diabetes mellitus with other specified complication: Secondary | ICD-10-CM

## 2021-06-04 DIAGNOSIS — J449 Chronic obstructive pulmonary disease, unspecified: Secondary | ICD-10-CM

## 2021-06-04 DIAGNOSIS — I10 Essential (primary) hypertension: Secondary | ICD-10-CM | POA: Diagnosis not present

## 2021-06-04 DIAGNOSIS — Z01810 Encounter for preprocedural cardiovascular examination: Secondary | ICD-10-CM | POA: Insufficient documentation

## 2021-06-04 DIAGNOSIS — N1831 Chronic kidney disease, stage 3a: Secondary | ICD-10-CM

## 2021-06-04 DIAGNOSIS — I700 Atherosclerosis of aorta: Secondary | ICD-10-CM

## 2021-06-04 DIAGNOSIS — I5032 Chronic diastolic (congestive) heart failure: Secondary | ICD-10-CM

## 2021-06-04 MED ORDER — ASPIRIN EC 81 MG PO TBEC: 81.0000 mg | DELAYED_RELEASE_TABLET | Freq: Every day | ORAL | 3 refills | Status: DC

## 2021-06-04 NOTE — Assessment & Plan Note (Signed)
BP controlled.  Continue Propranolol 10 mg twice daily, Spironolactone 25 mg once daily.  ?

## 2021-06-04 NOTE — Patient Instructions (Addendum)
Medication Instructions:  ?Your physician has recommended you make the following change in your medication:  ? START Aspirin 81 mg taking 1 daily ? ?*If you need a refill on your cardiac medications before your next appointment, please call your pharmacy* ? ? ?Lab Work: ?2-3 WEEKS AT YOUR PCP'S OFFICE:  FASTING LIPIDS ? ?If you have labs (blood work) drawn today and your tests are completely normal, you will receive your results only by: ?MyChart Message (if you have MyChart) OR ?A paper copy in the mail ?If you have any lab test that is abnormal or we need to change your treatment, we will call you to review the results. ? ? ?Testing/Procedures: ?None ordered ? ? ?You have been referred to LIPID CLINIC TO SEE ONE OF OUR PHARMACISTS. ? ? ?Follow-Up: ?At [facility], you and your health needs are our priority.  As part of our continuing mission to provide you with exceptional heart care, we have created designated Provider Care Teams.  These Care Teams include your primary Cardiologist (physician) and Advanced Practice Providers (APPs -  Physician Assistants and Nurse Practitioners) who all work together to provide you with the care you need, when you need it. ? ?We recommend signing up for the patient portal called "MyChart".  Sign up information is provided on this After Visit Summary.  MyChart is used to connect with patients for Virtual Visits (Telemedicine).  Patients are able to view lab/test results, encounter notes, upcoming appointments, etc.  Non-urgent messages can be sent to your provider as well.   ?To learn more about what you can do with MyChart, go to [email or web address].   ? ?Your next appointment:   ?6 month(s) ? ?The format for your next appointment:   ?In Person ? ?Provider:   ?[name], MD  or Richardson Dopp, PA-C       ? ? ?Other Instructions ? ? ?

## 2021-06-04 NOTE — Assessment & Plan Note (Signed)
She is contemplating knee replacement.  She cannot achieve 4 METs.  I think her risk is significant enough she would need a stress test prior to proceeding for risk stratification.  This can be arranged if/when she wants to schedule surgery.   ?

## 2021-06-04 NOTE — Assessment & Plan Note (Signed)
She is now seeing endocrinology for diabetes.  She has not been able to tolerate statins.  She has a family member that has been taking PCSK9i.  She is interested in trying this.  Arrange fasting Lipids.  Refer to [facility].  ?

## 2021-06-04 NOTE — Assessment & Plan Note (Signed)
She is mainly limited by knee arthritis and Chronic Obstructive Pulmonary Disease.  Her volume is overall stable.  She has not had to take Metolazone in a long time.  Continue Lasix 60 mg twice daily, spironolactone 25 mg once daily.  Recent creatinine stable.  F/u in 6 mos.  ?

## 2021-06-04 NOTE — Assessment & Plan Note (Signed)
Start ASA 81 mg once daily.  Refer to Lipid Clinic for consideration of PCSK9i.  ?

## 2021-06-04 NOTE — Assessment & Plan Note (Signed)
She is followed by Dr. Halford Chessman with [facility].  ?

## 2021-06-04 NOTE — Assessment & Plan Note (Signed)
She is followed by Nephrology (Dr. Neta Ehlers at [facility]). ?

## 2021-06-04 NOTE — Progress Notes (Signed)
?Cardiology Office Note:   ? ?Date:  06/04/2021  ? ?ID:  Kathleen Hatfield, DOB 11/12/1955, MRN [id number] ? ?PCP:  [name], MD  ?[facility] Providers ?Cardiologist:  [name], MD ?Cardiology APP:  Liliane Shi, PA-C     ?Referring MD: [name], MD  ? ?Chief Complaint:  F/u for CHF ?  ? 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  ?Prior CV studies: ?Cardiac MRI 01/01/20 ?IMPRESSION: ?1. Normal LV size with mild LV hypertrophy, hyperdynamic systolic ?function with EF 78%. ?  ?2. Normal RV size and systolic function, EF 53%. There does not ?appear to be true RV hypertrophy present. There is very thick ?epicardial adipose tissue that may have been mistaken as RVH on the ?echo. ?  ?3. There was a small area of mid-wall LGE at the RV insertion site. ?This is nonspecific and may be suggestive of elevated filling ?pressures. ?  ?Echocardiogram 05/18/2019 ?EF 70, no RWMA, mild LVH, Gr 1 DD, normal RVSF, severe RVH, Asc Ao 39 mm ?  ?Myoview 06/07/2019 ?EF 83, no ischemia or infarction; Low Risk ?  ?Echocardiogram 04/03/2016 ?Mild LVH, EF 65-70, no RWMA, Gr 1 DD, LVOT gradient (2.6 m/s) likely related to hyperdynamic LVF ?  ?Myoview 06/23/2012 ?No ischemia, EF 82; Low Risk ? ? ?History of Present Illness:   ?Kathleen Hatfield is a 66 y.o. female with the above problem list.  She has a hx of significant RVH noted on echocardiogram in March 2021.  She therefore underwent cardiac MRI which demonstrated EF 78%.  There was no true RVH present on cardiac MRI.  There was very thick  epicardial adipose tissue which may have been mistaken for RVH on echocardiogram.       ? ?She was last seen by Dr. Burt Knack in March 2022 and returns for f/u.  Of note, she was admitted in July 2022 with AECOPD and Dec 2022 with AECOPD in the setting of Flu A.  She is here today with her boyfriend.  She arrives in a wheelchair.  She needs knee replacement.  Her knees are painful today and it is difficult for her to walk.  She has not had chest pain, syncope, leg edema. She has chronic shortness of breath related to Chronic Obstructive Pulmonary Disease. ?   ?Past Medical History:  ?Diagnosis Date  ? Anemia   ? Arthritis   ? Asthma   ? Chronic diastolic CHF 64/6803  ? Echocardiogram 05/2019: EF 70, no RWMA, mild LVH, Gr 1 DD, normal RVSF, severe LVH, borderline asc Aorta (39 mm)  ? CKD (chronic kidney disease)   ? Depression   ? Diabetes mellitus without complication ([place])   ? Diverticulitis   ? Dyspnea   ? with exertion  ? GERD (gastroesophageal reflux disease)   ? History of blood transfusion   ? Hyperlipidemia   ? cannot tolerate statins  ? Hypertension   ? patient states she has never had high blood pressure.   ? Nuclear stress test   ? Myoview 05/2019: EF 83, no ischemia or infarction; Low Risk  ? Peripheral neuropathy   ? Pneumonia   ?  PONV (postoperative nausea and vomiting)   ? RLS (restless legs syndrome)   ? Sleep apnea   ? uses CPAP  ? Ulcerative colitis ([place])   ? dr Collene Mares  ? ?Current Medications: ?Current Meds  ?Medication Sig  ? acetaminophen (TYLENOL) 500 MG tablet Take 500 mg by mouth every 6 (six) hours as needed for moderate pain.  ? albuterol (VENTOLIN HFA) 108 (90 Base) MCG/ACT inhaler Inhale 2 puffs into the lungs every 6 (six) hours as needed.  ? aspirin EC 81 MG tablet Take 1 tablet (81 mg total) by mouth daily. Swallow whole.  ? BAYER CONTOUR NEXT TEST test strip 1 each by Other route daily. As directed  ? budesonide (PULMICORT) 0.5 MG/2ML nebulizer solution Take 2 mLs (0.5 mg total) by  nebulization 2 (two) times daily.  ? buPROPion (WELLBUTRIN XL) 300 MG 24 hr tablet TAKE 1 TABLET BY MOUTH DAILY  ? Cholecalciferol (VITAMIN D-3) 5000 UNITS TABS Take 5,000 Units by mouth every other day.   ? colchicine 0.6 MG tablet TAKE 1 TABLET (0.6 MG TOTAL) BY MOUTH DAILY AS NEEDED (FOR GOUT).  ? dicyclomine (BENTYL) 20 MG tablet TAKE 1 TABLET UP TO FOUR TIMES A DAY FOR GI CRAMPING, PAIN, NAUSEA, AND VOMITING AS NEEDED  ? DULoxetine (CYMBALTA) 60 MG capsule TAKE 1 CAPSULE BY MOUTH ONCE DAILY.  ? fluticasone (FLONASE) 50 MCG/ACT nasal spray Place 2 sprays into both nostrils daily.  ? furosemide (LASIX) 20 MG tablet Take 3 tablets (60 mg total) by mouth 2 (two) times daily.  ? gabapentin (NEURONTIN) 300 MG capsule Take 1 capsule (300 mg total) by mouth at bedtime.  ? GLOBAL EASE INJECT PEN NEEDLES 32G X 4 MM MISC USE AS DIRECTED WITH VICTOZA AND TRESIBA.  ? insulin degludec (TRESIBA FLEXTOUCH) 200 UNIT/ML FlexTouch Pen Inject 120 Units into the skin daily.  ? ipratropium-albuterol (DUONEB) 0.5-2.5 (3) MG/3ML SOLN Take 3 mLs by nebulization every 6 (six) hours as needed (wheezing).  ? Iron, Ferrous Sulfate, 325 (65 Fe) MG TABS Take 325 mg by mouth every other day.  ? metolazone (ZAROXOLYN) 5 MG tablet Take 1 tablet (5 mg total) by mouth as needed.  ? MICROLET LANCETS MISC 1 each by Other route. As directed  ? nystatin (MYCOSTATIN/NYSTOP) powder Apply 1 application topically 3 (three) times daily.  ? ondansetron (ZOFRAN) 4 MG tablet Take 1 tablet (4 mg total) by mouth every 8 (eight) hours as needed for vomiting or nausea.  ? oxyCODONE-acetaminophen (PERCOCET/ROXICET) 5-325 MG tablet Take 1 tablet by mouth every 6 (six) hours as needed for severe pain.  ? pantoprazole (PROTONIX) 40 MG tablet Take 1 tablet (40 mg total) by mouth 2 (two) times daily.  ? polyethylene glycol (MIRALAX / GLYCOLAX) packet Take 17 g by mouth daily as needed for mild constipation.  ? potassium chloride (MICRO-K) 10 MEQ CR capsule TAKE 5  CAPSULES (50 MEQ TOTAL) BY MOUTH 2 (TWO) TIMES DAILY  ? promethazine-dextromethorphan (PROMETHAZINE-DM) 6.25-15 MG/5ML syrup Take 2.5 mLs by mouth 4 (four) times daily as needed for cough (sedation caution).  ? propranolol (INDERAL) 10 MG tablet TAKE 1 TABLET BY MOUTH 2 TIMES DAILY  ? revefenacin (YUPELRI) 175 MCG/3ML nebulizer solution Inhale one vial in nebulizer once daily. Do not mix with other nebulized medications.  ? rOPINIRole (REQUIP) 4 MG tablet TAKE 1 TABLET BY MOUTH TWICE DAILY  ? spironolactone (ALDACTONE) 25 MG tablet TAKE 1 TABLET BY MOUTH DAILY  ? traMADol (ULTRAM) 50 MG tablet Take 1 tablet (50  mg total) by mouth 3 (three) times daily as needed.  ? TRULICITY 4.5 YX/2.1LU SOPN INJECT 4.5 MG AS DIRECTED ONCE A WEEK.  ? UNABLE TO FIND CPAP- At bedtime  ?  ?Allergies:   Almond oil, Morphine and related, Atorvastatin, Ceclor [cefaclor], Sulfa antibiotics, Ciprofloxacin, Levaquin [levofloxacin], Losartan, and Statins  ? ?Social History  ? ?Tobacco Use  ? Smoking status: Never  ? Smokeless tobacco: Never  ?Vaping Use  ? Vaping Use: Never used  ?Substance Use Topics  ? Alcohol use: No  ? Drug use: No  ?  ?Family Hx: ?The patient's family history includes COPD in her father; Dementia in her mother; Diabetes in her maternal grandmother; Heart disease in her brother, father, and mother; Hypertension in her father and mother; Other in her brother and brother. There is no history of Breast cancer or Colon cancer. ? ?Review of Systems  ?Musculoskeletal:  Positive for joint pain.   ? ?EKGs/Labs/Other Test Reviewed:   ? ?EKG:  EKG is not ordered today.  The ekg ordered today demonstrates n/a ? ?Recent Labs: ?09/20/2020: Magnesium 2.2 ?02/14/2021: B Natriuretic Peptide 40.3 ?02/27/2021: Pro B Natriuretic peptide (BNP) 23.0 ?03/31/2021: ALT 16; BUN 20; Creatinine, Ser 1.24; Hemoglobin 13.6; Platelets 328; Potassium 3.8; Sodium 137  ? ?Recent Lipid Panel ?No results for input(s): CHOL, TRIG, HDL, VLDL, LDLCALC, LDLDIRECT  in the last 8760 hours.  ? ?Risk Assessment/Calculations:   ?  ?    ?Physical Exam:   ? ?VS:  BP 108/60 (BP Location: Left Arm, Patient Position: Sitting, Cuff Size: Large)   Pulse 70   Ht 5' 5"  (1.651 m)   Wt 256 lb 3

## 2021-06-05 ENCOUNTER — Other Ambulatory Visit: Payer: Self-pay | Admitting: Family Medicine

## 2021-06-05 ENCOUNTER — Other Ambulatory Visit: Payer: Self-pay | Admitting: Physician Assistant

## 2021-06-05 MED ORDER — ALBUTEROL SULFATE HFA 108 (90 BASE) MCG/ACT IN AERS: 2.0000 | INHALATION_SPRAY | Freq: Four times a day (QID) | RESPIRATORY_TRACT | 2 refills | Status: DC | PRN

## 2021-06-05 NOTE — Addendum Note (Signed)
Addended by: [name] on: 06/05/2021 08:36 AM ? ? Modules accepted: Orders ? ?

## 2021-06-05 NOTE — Telephone Encounter (Signed)
Refilled albuterol per pt request. ?Instructed [facility] to provide cetirizine 10 mg vial for PRN use. ?

## 2021-06-06 DIAGNOSIS — I110 Hypertensive heart disease with heart failure: Secondary | ICD-10-CM | POA: Diagnosis not present

## 2021-06-06 DIAGNOSIS — J449 Chronic obstructive pulmonary disease, unspecified: Secondary | ICD-10-CM

## 2021-06-06 DIAGNOSIS — E1159 Type 2 diabetes mellitus with other circulatory complications: Secondary | ICD-10-CM | POA: Diagnosis not present

## 2021-06-06 DIAGNOSIS — Z794 Long term (current) use of insulin: Secondary | ICD-10-CM | POA: Diagnosis not present

## 2021-06-06 DIAGNOSIS — E785 Hyperlipidemia, unspecified: Secondary | ICD-10-CM

## 2021-06-06 DIAGNOSIS — J45909 Unspecified asthma, uncomplicated: Secondary | ICD-10-CM | POA: Diagnosis not present

## 2021-06-06 DIAGNOSIS — I5030 Unspecified diastolic (congestive) heart failure: Secondary | ICD-10-CM

## 2021-06-06 DIAGNOSIS — Z7984 Long term (current) use of oral hypoglycemic drugs: Secondary | ICD-10-CM

## 2021-06-06 DIAGNOSIS — F32A Depression, unspecified: Secondary | ICD-10-CM

## 2021-06-06 DIAGNOSIS — R69 Illness, unspecified: Secondary | ICD-10-CM | POA: Diagnosis not present

## 2021-06-06 MED ORDER — PANTOPRAZOLE SODIUM 40 MG PO TBEC: 40.0000 mg | DELAYED_RELEASE_TABLET | Freq: Every day | ORAL | 0 refills | Status: DC

## 2021-06-06 NOTE — Addendum Note (Signed)
Addended by: [name] on: 06/06/2021 09:07 AM ? ? Modules accepted: Orders ? ?

## 2021-06-11 ENCOUNTER — Other Ambulatory Visit: Payer: Self-pay | Admitting: Family Medicine

## 2021-06-11 DIAGNOSIS — R110 Nausea: Secondary | ICD-10-CM

## 2021-06-17 DIAGNOSIS — G4733 Obstructive sleep apnea (adult) (pediatric): Secondary | ICD-10-CM | POA: Diagnosis not present

## 2021-06-19 ENCOUNTER — Other Ambulatory Visit: Payer: Self-pay | Admitting: Family Medicine

## 2021-06-19 DIAGNOSIS — E1165 Type 2 diabetes mellitus with hyperglycemia: Secondary | ICD-10-CM

## 2021-06-19 DIAGNOSIS — J449 Chronic obstructive pulmonary disease, unspecified: Secondary | ICD-10-CM | POA: Diagnosis not present

## 2021-06-20 ENCOUNTER — Ambulatory Visit (INDEPENDENT_AMBULATORY_CARE_PROVIDER_SITE_OTHER): Payer: Medicare HMO

## 2021-06-20 DIAGNOSIS — J449 Chronic obstructive pulmonary disease, unspecified: Secondary | ICD-10-CM

## 2021-06-20 DIAGNOSIS — E119 Type 2 diabetes mellitus without complications: Secondary | ICD-10-CM

## 2021-06-20 DIAGNOSIS — I5032 Chronic diastolic (congestive) heart failure: Secondary | ICD-10-CM

## 2021-06-20 NOTE — Chronic Care Management (AMB) (Signed)
?Chronic Care Management  ? ?CCM RN Visit Note ? ?06/20/2021 ?Name: Kathleen Hatfield MRN: [id number] DOB: [birth year] ? ?Subjective: ?Kathleen Hatfield is a 66 y.o. year old female who is a primary care patient of [name], MD. The care management team was consulted for assistance with disease management and care coordination needs.   ? ?Engaged with patient by telephone for follow up visit in response to provider referral for case management and/or care coordination services.  ? ?Consent to Services:  ?The patient was given information about Chronic Care Management services, agreed to services, and gave verbal consent prior to initiation of services.  Please see initial visit note for detailed documentation.  ? ?Patient agreed to services and verbal consent obtained.  ? ?Assessment: Review of patient past medical history, allergies, medications, health status, including review of consultants reports, laboratory and other test data, was performed as part of comprehensive evaluation and provision of chronic care management services.  ? ?SDOH (Social Determinants of Health) assessments and interventions performed:   ? ?CCM Care Plan ? ?Allergies  ?Allergen Reactions  ? Almond Oil Anaphylaxis, Shortness Of Breath and Swelling  ? Morphine And Related Shortness Of Breath and Other (See Comments)  ?  Pt. States while in the hospital it affected her breathing, O2 dropped to the 70's  ? Atorvastatin Other (See Comments)  ?  Leg weakness  ? Ceclor [Cefaclor] Nausea And Vomiting  ? Sulfa Antibiotics Nausea And Vomiting  ? Ciprofloxacin Other (See Comments)  ?  Makes joints and muscles ache  ? Levaquin [Levofloxacin] Other (See Comments)  ?  Body aches  ? Losartan Other (See Comments)  ?  Weakness ?  ? Statins Other (See Comments)  ?  Leg and body weakness  ? ? ?Outpatient Encounter Medications as of 06/20/2021  ?Medication Sig  ? acetaminophen (TYLENOL) 500 MG tablet Take 500 mg by mouth every 6 (six) hours as needed for moderate  pain.  ? albuterol (VENTOLIN HFA) 108 (90 Base) MCG/ACT inhaler Inhale 2 puffs into the lungs every 6 (six) hours as needed.  ? aspirin EC 81 MG tablet Take 1 tablet (81 mg total) by mouth daily. Swallow whole.  ? BAYER CONTOUR NEXT TEST test strip 1 each by Other route daily. As directed  ? budesonide (PULMICORT) 0.5 MG/2ML nebulizer solution Take 2 mLs (0.5 mg total) by nebulization 2 (two) times daily.  ? buPROPion (WELLBUTRIN XL) 300 MG 24 hr tablet TAKE 1 TABLET BY MOUTH DAILY  ? Cholecalciferol (VITAMIN D-3) 5000 UNITS TABS Take 5,000 Units by mouth every other day.   ? colchicine 0.6 MG tablet TAKE 1 TABLET (0.6 MG TOTAL) BY MOUTH DAILY AS NEEDED (FOR GOUT).  ? dicyclomine (BENTYL) 20 MG tablet TAKE 1 TABLET UP TO FOUR TIMES A DAY FOR GI CRAMPING, PAIN, NAUSEA, AND VOMITING AS NEEDED  ? DULoxetine (CYMBALTA) 60 MG capsule TAKE 1 CAPSULE BY MOUTH ONCE DAILY.  ? fluticasone (FLONASE) 50 MCG/ACT nasal spray Place 2 sprays into both nostrils daily.  ? furosemide (LASIX) 20 MG tablet TAKE THREE TABLETS BY MOUTH EVERY MORNING and TAKE THREE TABLETS BY MOUTH AT NOON  ? gabapentin (NEURONTIN) 300 MG capsule Take 1 capsule (300 mg total) by mouth at bedtime.  ? GLOBAL EASE INJECT PEN NEEDLES 32G X 4 MM MISC USE AS DIRECTED WITH VICTOZA AND TRESIBA.  ? insulin degludec (TRESIBA FLEXTOUCH) 200 UNIT/ML FlexTouch Pen Inject 120 Units into the skin daily.  ? ipratropium-albuterol (DUONEB) 0.5-2.5 (3) MG/3ML  SOLN Take 3 mLs by nebulization every 6 (six) hours as needed (wheezing).  ? Iron, Ferrous Sulfate, 325 (65 Fe) MG TABS Take 325 mg by mouth every other day.  ? metolazone (ZAROXOLYN) 5 MG tablet Take 1 tablet (5 mg total) by mouth as needed.  ? MICROLET LANCETS MISC 1 each by Other route. As directed  ? nystatin (MYCOSTATIN/NYSTOP) powder Apply 1 application topically 3 (three) times daily.  ? ondansetron (ZOFRAN) 4 MG tablet TAKE ONE TABLET BY MOUTH every EIGHT hours AS NEEDED FOR NAUSEA AND VOMITING  ?  oxyCODONE-acetaminophen (PERCOCET/ROXICET) 5-325 MG tablet Take 1 tablet by mouth every 6 (six) hours as needed for severe pain.  ? pantoprazole (PROTONIX) 40 MG tablet Take 1 tablet (40 mg total) by mouth daily.  ? polyethylene glycol (MIRALAX / GLYCOLAX) packet Take 17 g by mouth daily as needed for mild constipation.  ? potassium chloride (MICRO-K) 10 MEQ CR capsule TAKE 5 CAPSULES (50 MEQ TOTAL) BY MOUTH 2 (TWO) TIMES DAILY  ? promethazine-dextromethorphan (PROMETHAZINE-DM) 6.25-15 MG/5ML syrup Take 2.5 mLs by mouth 4 (four) times daily as needed for cough (sedation caution).  ? propranolol (INDERAL) 10 MG tablet TAKE 1 TABLET BY MOUTH 2 TIMES DAILY  ? revefenacin (YUPELRI) 175 MCG/3ML nebulizer solution Inhale one vial in nebulizer once daily. Do not mix with other nebulized medications.  ? rOPINIRole (REQUIP) 4 MG tablet TAKE 1 TABLET BY MOUTH TWICE DAILY  ? spironolactone (ALDACTONE) 25 MG tablet TAKE ONE TABLET BY MOUTH ONCE DAILY  ? traMADol (ULTRAM) 50 MG tablet Take 1 tablet (50 mg total) by mouth 3 (three) times daily as needed.  ? TRULICITY 4.5 WN/0.2VO SOPN INJECT 4.5 MG AS DIRECTED ONCE A WEEK.  ? UNABLE TO FIND CPAP- At bedtime  ? ?No facility-administered encounter medications on file as of 06/20/2021.  ? ? ?Patient Active Problem List  ? Diagnosis Date Noted  ? Aortic atherosclerosis ([place]) 06/04/2021  ? Preoperative cardiovascular examination 06/04/2021  ? Hyperglycemia 02/14/2021  ? Rash 09/27/2020  ? COPD exacerbation ([place]) 09/21/2020  ? Acute exacerbation of COPD with asthma ([place]) 09/19/2020  ? Mixed diabetic hyperlipidemia associated with type 2 diabetes mellitus ([place]) 09/19/2020  ? Pain of foot 06/20/2020  ? Medicare annual wellness visit, initial 06/20/2020  ? Constipation 06/11/2020  ? Diverticulosis of colon 06/11/2020  ? Irritable bowel syndrome 06/11/2020  ? Periumbilical pain 53/66/4403  ? RLS (restless legs syndrome)   ? Asthma exacerbation 01/08/2020  ? Muscle weakness 01/08/2020  ?  Grade I diastolic dysfunction 47/42/5956  ? 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 Advance care planning 06/14/2019  ? RLQ abdominal pain 06/14/2019  ? COPD (chronic obstructive pulmonary disease) ([place]) 06/14/2019  ? Leukocytosis 06/14/2019  ? Hypercalcemia 06/14/2019  ? Gout 06/14/2019  ? Type 2 diabetes mellitus with hyperglycemia, without long-term current use of insulin ([place])   ? COPD with acute exacerbation ([place]) 05/03/2017  ? Diabetes mellitus without complication ([place])   ? CKD (chronic kidney disease) stage 3, GFR 30-59 ml/min ([facility])   ? Hypersomnia with sleep apnea 09/16/2015  ? Fatigue 06/07/2015  ? Morbid obesity ([place]) 06/07/2015  ? (HFpEF) heart failure with preserved ejection fraction ([place])   ? Pneumonia of both lower lobes due to infectious organism 02/09/2013  ? OSA on CPAP 08/23/2012  ? Essential hypertension   ? Depression   ? GERD without esophagitis   ? Hyperlipidemia   ? Ulcerative colitis ([place])   ? Patellar tendinitis 05/25/2011  ? Knee pain 05/25/2011  ?  Myofascial pain 05/25/2011  ? Cervicalgia 05/25/2011  ? ? ?Conditions to be addressed/monitored:CHF, COPD, and DMII ? ?Care Plan : RNCM plan of care  ?Updates made by Dannielle Karvonen, RN since 06/20/2021 12:00 AM  ?  ? ?Problem: chronic disease management, education and/ or care coordination   ?Priority: High  ?  ? ?Long-Range Goal: Development of plan of care to address chronic disease management and / or care coordination needs.   ?Start Date: 02/07/2021  ?Expected End Date: 07/04/2021  ?Priority: High  ?Note:   ?Current Barriers:  ?Knowledge Deficits related to plan of care for management of COPD and DMII  ?Chronic Disease Management support and education needs related to COPD and DMII ?Patient called to [facility] stating she needed assistance with her medications. She states she needs her potassium capsules separate from her other medications.  She states she rather have the potassium capsules together and not in the same pill pack as her other  medication because its difficult to get them out. RNCM informed patient she would send this request to the practice pharmacist.   ? ?RNCM Clinical Goal(s):  ?Patient will verbalize basic understanding of COPD and DMII disease p

## 2021-06-20 NOTE — Patient Instructions (Signed)
Visit Information ? ?Thank you for taking time to visit with me today. Please don't hesitate to contact me if I can be of assistance to you before our next scheduled telephone appointment. ? ?Following are the goals we discussed today:  ?Call pharmacy for medication refills 3-7 days in advance of running out of medications.  Contact practice pharmacist, Debbora Dus if you have questions/ concerns regarding your medications.  ? - Continue to take your medications as prescribed and rinse mouth out after use on inhalers ?- keep follow-up appointments with your providers ?-Identify and avoid symptom triggers, such as second hand smoke, virus, weather change, emotional upset, heavy exercise,  and environmental allergens that could exacerbate your COPD or asthma.  ?- Continue to wear your CPAP machine nightly and use your oxygen as advised by your doctor.  ?-Schedule follow up appointments with your pulmonologist  and urologist ?- Check blood sugars daily.   Check blood sugar if you feel it is too high or too low and take your blood sugar readings or monitor to your doctor appointment.  ?- Plan to eat low carbohydrate and low salt meals, watch portion sizes and avoid sugar sweetened drinks.  ?Your request regarding the potassium medication and pill pack has been forwarded to the practice pharmacist.  Please contact your RN case manager if you have any further concerns.  ? ?Our next appointment is by telephone on 07/31/21 at 2:00 pm ? ?Please call the care guide team at [phone] if you need to cancel or reschedule your appointment.  ? ?If you are experiencing a Mental Health or [place] or need someone to talk to, please call the Suicide and Crisis Lifeline: 988 ?call 1-800-273-TALK (toll free, 24 hour hotline)  ? ?Patient verbalizes understanding of instructions and care plan provided today and agrees to view in [place]. Active MyChart status confirmed with patient.   ? ?Quinn Plowman RN,BSN,CCM ?RN  Case Manager ?[place]  ?[phone] ? ?

## 2021-06-24 ENCOUNTER — Telehealth: Payer: Self-pay

## 2021-06-24 NOTE — Progress Notes (Addendum)
Contacted the patient and scheduled a face to face visit with the CPP to discuss issues with the packaging of her medications. ? ?[name], CPP notified ? ?Marguerette Sheller, CCMA ?Health concierge  ?[phone]  ?

## 2021-06-25 NOTE — Chronic Care Management (AMB) (Signed)
? ? ?Chronic Care Management ?Pharmacy Assistant  ? ?Name: Kathleen Hatfield  MRN: [id number] DOB: 1955-07-14 ? ? ?Reason for Encounter: Reminder Call ?  ?Conditions to be addressed/monitored: ?HTN, HLD, COPD, DMII, and CKD Stage 3 ? ? ?Medications: ?Outpatient Encounter Medications as of 06/24/2021  ?Medication Sig  ? acetaminophen (TYLENOL) 500 MG tablet Take 500 mg by mouth every 6 (six) hours as needed for moderate pain.  ? albuterol (VENTOLIN HFA) 108 (90 Base) MCG/ACT inhaler Inhale 2 puffs into the lungs every 6 (six) hours as needed.  ? aspirin EC 81 MG tablet Take 1 tablet (81 mg total) by mouth daily. Swallow whole.  ? BAYER CONTOUR NEXT TEST test strip 1 each by Other route daily. As directed  ? budesonide (PULMICORT) 0.5 MG/2ML nebulizer solution Take 2 mLs (0.5 mg total) by nebulization 2 (two) times daily.  ? buPROPion (WELLBUTRIN XL) 300 MG 24 hr tablet TAKE 1 TABLET BY MOUTH DAILY  ? Cholecalciferol (VITAMIN D-3) 5000 UNITS TABS Take 5,000 Units by mouth every other day.   ? colchicine 0.6 MG tablet TAKE 1 TABLET (0.6 MG TOTAL) BY MOUTH DAILY AS NEEDED (FOR GOUT).  ? dicyclomine (BENTYL) 20 MG tablet TAKE 1 TABLET UP TO FOUR TIMES A DAY FOR GI CRAMPING, PAIN, NAUSEA, AND VOMITING AS NEEDED  ? Dulaglutide (TRULICITY) 4.5 FM/3.8GY SOPN Inject 4.5 mg once weekly  ? DULoxetine (CYMBALTA) 60 MG capsule TAKE 1 CAPSULE BY MOUTH ONCE DAILY.  ? fluticasone (FLONASE) 50 MCG/ACT nasal spray Place 2 sprays into both nostrils daily.  ? furosemide (LASIX) 20 MG tablet TAKE THREE TABLETS BY MOUTH EVERY MORNING and TAKE THREE TABLETS BY MOUTH AT NOON  ? gabapentin (NEURONTIN) 300 MG capsule Take 1 capsule (300 mg total) by mouth at bedtime.  ? GLOBAL EASE INJECT PEN NEEDLES 32G X 4 MM MISC USE AS DIRECTED WITH VICTOZA AND TRESIBA.  ? insulin degludec (TRESIBA FLEXTOUCH) 200 UNIT/ML FlexTouch Pen Inject 120 Units into the skin daily.  ? ipratropium-albuterol (DUONEB) 0.5-2.5 (3) MG/3ML SOLN Take 3 mLs by nebulization every 6  (six) hours as needed (wheezing).  ? Iron, Ferrous Sulfate, 325 (65 Fe) MG TABS Take 325 mg by mouth every other day.  ? metolazone (ZAROXOLYN) 5 MG tablet Take 1 tablet (5 mg total) by mouth as needed.  ? MICROLET LANCETS MISC 1 each by Other route. As directed  ? nystatin (MYCOSTATIN/NYSTOP) powder Apply 1 application topically 3 (three) times daily.  ? ondansetron (ZOFRAN) 4 MG tablet TAKE ONE TABLET BY MOUTH every EIGHT hours AS NEEDED FOR NAUSEA AND VOMITING  ? oxyCODONE-acetaminophen (PERCOCET/ROXICET) 5-325 MG tablet Take 1 tablet by mouth every 6 (six) hours as needed for severe pain.  ? pantoprazole (PROTONIX) 40 MG tablet Take 1 tablet (40 mg total) by mouth daily.  ? polyethylene glycol (MIRALAX / GLYCOLAX) packet Take 17 g by mouth daily as needed for mild constipation.  ? potassium chloride (MICRO-K) 10 MEQ CR capsule TAKE 5 CAPSULES (50 MEQ TOTAL) BY MOUTH 2 (TWO) TIMES DAILY  ? promethazine-dextromethorphan (PROMETHAZINE-DM) 6.25-15 MG/5ML syrup Take 2.5 mLs by mouth 4 (four) times daily as needed for cough (sedation caution).  ? propranolol (INDERAL) 10 MG tablet TAKE 1 TABLET BY MOUTH 2 TIMES DAILY  ? revefenacin (YUPELRI) 175 MCG/3ML nebulizer solution Inhale one vial in nebulizer once daily. Do not mix with other nebulized medications.  ? rOPINIRole (REQUIP) 4 MG tablet TAKE 1 TABLET BY MOUTH TWICE DAILY  ? spironolactone (ALDACTONE) 25 MG tablet  TAKE ONE TABLET BY MOUTH ONCE DAILY  ? traMADol (ULTRAM) 50 MG tablet Take 1 tablet (50 mg total) by mouth 3 (three) times daily as needed.  ? UNABLE TO FIND CPAP- At bedtime  ? ?No facility-administered encounter medications on file as of 06/24/2021.  ? ?Kathleen Hatfield was contacted to remind of upcoming office visit with Kathleen Hatfield on 07/04/21 at 8:45am. Patient was reminded to have any blood glucose and blood pressure readings available for review at appointment.  ? ?Patient confirmed appointment.  Face to Face appointment  ? ? ?Are you having any  problems with your medications? Yes  ? ?Do you have any concerns you like to discuss with the pharmacist?  Patient reports too many packages with her medications and the potassium sticks to packs ? ? ? ?Star Rating Drugs: ?Medication:  Last Fill: Day Supply ?Tresiba 200 unit/ml     05/14/21               90 ?Trulicity 5.7MB            06/20/21             28 ?  ? ?Kathleen Hatfield, CPP notified ? ?Kathleen Hatfield, CCMA ?Health concierge  ?[phone]  ?

## 2021-06-30 ENCOUNTER — Telehealth: Payer: Medicare HMO

## 2021-06-30 DIAGNOSIS — J449 Chronic obstructive pulmonary disease, unspecified: Secondary | ICD-10-CM | POA: Diagnosis not present

## 2021-06-30 DIAGNOSIS — I5032 Chronic diastolic (congestive) heart failure: Secondary | ICD-10-CM | POA: Diagnosis not present

## 2021-06-30 DIAGNOSIS — N1831 Chronic kidney disease, stage 3a: Secondary | ICD-10-CM | POA: Diagnosis not present

## 2021-06-30 DIAGNOSIS — E782 Mixed hyperlipidemia: Secondary | ICD-10-CM | POA: Diagnosis not present

## 2021-06-30 DIAGNOSIS — E1169 Type 2 diabetes mellitus with other specified complication: Secondary | ICD-10-CM | POA: Diagnosis not present

## 2021-06-30 DIAGNOSIS — I10 Essential (primary) hypertension: Secondary | ICD-10-CM | POA: Diagnosis not present

## 2021-07-01 ENCOUNTER — Other Ambulatory Visit: Payer: Self-pay | Admitting: Physician Assistant

## 2021-07-01 ENCOUNTER — Telehealth: Payer: Self-pay

## 2021-07-01 LAB — LIPID PANEL
Chol/HDL Ratio: 7.6 ratio — ABNORMAL HIGH (ref 0.0–4.4)
Cholesterol, Total: 302 mg/dL — ABNORMAL HIGH (ref 100–199)
HDL: 40 mg/dL (ref >39)
LDL Chol Calc (NIH): 208 mg/dL — ABNORMAL HIGH (ref 0–99)
Triglycerides: 270 mg/dL — ABNORMAL HIGH (ref 0–149)
VLDL Cholesterol Cal: 54 mg/dL — ABNORMAL HIGH (ref 5–40)

## 2021-07-01 NOTE — Chronic Care Management (AMB) (Signed)
? ? ?Chronic Care Management ?Pharmacy Assistant  ? ?Name: Kathleen Hatfield  MRN: [id number] DOB: 06-05-1955 ? ?Reason for Encounter: Medication Adherence and Delivery Coordination ?  ?Recent office visits:  ?06/03/21-RN case mgr-[facility]- Telephone call-Education DM,COPD ? ?Recent consult visits:  ?06/04/21-[name],PA(Cardiology)-F/U CHF-Recent creatinine stable. Arrange fasting Lipids(abnormal)  Refer to [facility].Start ASA 81 mg once daily. ? ?Hospital visits:  ?None in previous 6 months ? ?Medications: ?Outpatient Encounter Medications as of 07/01/2021  ?Medication Sig  ? acetaminophen (TYLENOL) 500 MG tablet Take 500 mg by mouth every 6 (six) hours as needed for moderate pain.  ? albuterol (VENTOLIN HFA) 108 (90 Base) MCG/ACT inhaler Inhale 2 puffs into the lungs every 6 (six) hours as needed.  ? aspirin EC 81 MG tablet Take 1 tablet (81 mg total) by mouth daily. Swallow whole.  ? BAYER CONTOUR NEXT TEST test strip 1 each by Other route daily. As directed  ? budesonide (PULMICORT) 0.5 MG/2ML nebulizer solution Take 2 mLs (0.5 mg total) by nebulization 2 (two) times daily.  ? buPROPion (WELLBUTRIN XL) 300 MG 24 hr tablet TAKE 1 TABLET BY MOUTH DAILY  ? Cholecalciferol (VITAMIN D-3) 5000 UNITS TABS Take 5,000 Units by mouth every other day.   ? colchicine 0.6 MG tablet TAKE 1 TABLET (0.6 MG TOTAL) BY MOUTH DAILY AS NEEDED (FOR GOUT).  ? dicyclomine (BENTYL) 20 MG tablet TAKE 1 TABLET UP TO FOUR TIMES A DAY FOR GI CRAMPING, PAIN, NAUSEA, AND VOMITING AS NEEDED  ? Dulaglutide (TRULICITY) 4.5 SF/6.8LE SOPN Inject 4.5 mg once weekly  ? DULoxetine (CYMBALTA) 60 MG capsule TAKE 1 CAPSULE BY MOUTH ONCE DAILY.  ? fluticasone (FLONASE) 50 MCG/ACT nasal spray Place 2 sprays into both nostrils daily.  ? furosemide (LASIX) 20 MG tablet TAKE THREE TABLETS BY MOUTH EVERY MORNING and TAKE THREE TABLETS BY MOUTH AT NOON  ? gabapentin (NEURONTIN) 300 MG capsule Take 1 capsule (300 mg total) by mouth at bedtime.  ? GLOBAL EASE  INJECT PEN NEEDLES 32G X 4 MM MISC USE AS DIRECTED WITH VICTOZA AND TRESIBA.  ? insulin degludec (TRESIBA FLEXTOUCH) 200 UNIT/ML FlexTouch Pen Inject 120 Units into the skin daily.  ? ipratropium-albuterol (DUONEB) 0.5-2.5 (3) MG/3ML SOLN Take 3 mLs by nebulization every 6 (six) hours as needed (wheezing).  ? Iron, Ferrous Sulfate, 325 (65 Fe) MG TABS Take 325 mg by mouth every other day.  ? metolazone (ZAROXOLYN) 5 MG tablet Take 1 tablet (5 mg total) by mouth as needed.  ? MICROLET LANCETS MISC 1 each by Other route. As directed  ? nystatin (MYCOSTATIN/NYSTOP) powder Apply 1 application topically 3 (three) times daily.  ? ondansetron (ZOFRAN) 4 MG tablet TAKE ONE TABLET BY MOUTH every EIGHT hours AS NEEDED FOR NAUSEA AND VOMITING  ? oxyCODONE-acetaminophen (PERCOCET/ROXICET) 5-325 MG tablet Take 1 tablet by mouth every 6 (six) hours as needed for severe pain.  ? pantoprazole (PROTONIX) 40 MG tablet Take 1 tablet (40 mg total) by mouth daily.  ? polyethylene glycol (MIRALAX / GLYCOLAX) packet Take 17 g by mouth daily as needed for mild constipation.  ? potassium chloride (MICRO-K) 10 MEQ CR capsule TAKE 5 CAPSULES (50 MEQ TOTAL) BY MOUTH 2 (TWO) TIMES DAILY  ? promethazine-dextromethorphan (PROMETHAZINE-DM) 6.25-15 MG/5ML syrup Take 2.5 mLs by mouth 4 (four) times daily as needed for cough (sedation caution).  ? propranolol (INDERAL) 10 MG tablet TAKE 1 TABLET BY MOUTH 2 TIMES DAILY  ? revefenacin (YUPELRI) 175 MCG/3ML nebulizer solution Inhale one vial  in nebulizer once daily. Do not mix with other nebulized medications.  ? rOPINIRole (REQUIP) 4 MG tablet TAKE 1 TABLET BY MOUTH TWICE DAILY  ? spironolactone (ALDACTONE) 25 MG tablet TAKE ONE TABLET BY MOUTH ONCE DAILY  ? traMADol (ULTRAM) 50 MG tablet Take 1 tablet (50 mg total) by mouth 3 (three) times daily as needed.  ? UNABLE TO FIND CPAP- At bedtime  ? ?No facility-administered encounter medications on file as of 07/01/2021.  ? ?BP Readings from Last 3  Encounters:  ?06/04/21 108/60  ?05/14/21 126/72  ?03/31/21 118/65  ?  ?Lab Results  ?Component Value Date  ? HGBA1C 9.0 (A) 05/14/2021  ?  ? ? ?Recent OV, Consult or Hospital visit:      Cardiology 06/04/21  ?Recent medication changes indicated:   start aspirin 62m  ? ? ?Last adherence delivery date:06/12/21     ? ?Patient is due for next adherence delivery on: 07/11/21 ? ? ?This delivery to include: Adherence Packaging  30 Days  ?Packs: ?Bupropion HCL XL 3073m take 1 tablet at evening meal ?Cholecalciferol (vit D3)-5000units- take 1 tablet breakfast every other day  ?Ropinirole 37m5mtake 1 tablet breakfast 1 tablet bedtime  ?Pantoprazole 93m22make 1 tablet bedtime ?Spironolactone 25mg53mke 1 tablet breakfast ?Propanolol 10mg-47me 1 tablet breakfast 1 tablet evening meal  ?Furosemide 20mg t6m3 tablets breakfast 3 tablet lunchtime ?Potassium chloride ER- take 4 tablets breakfast 2 tablets lunch 4 tablets evening meal ?Duloxetine 60mg- t97m1 tablet breakfast ?Ferrous Sulfate 325mg- ta737m tablet evening meal every other day  ?Gabapentin 300mg-take50mablet bedtime ?Cetirizine 10mg- take20mablet daily as needed- ?Aspirin 81mg -take 637mblet at breakfast  ? ?VIAL medications: ?Ondansetron 37mg - take 131mblet every 8 hours as needed for nausea  ? ?Patient declined the following medications this month: ?Albuterol  108 mcg/act inhaler- 2 puffs into lungs every 6 hours as needed- filled 3/30 ?Trulicity 4.5mg- gets f2.6JFVS ?Tresiba -getsTyler AasVS ? ?Any concerns about your medications? Yes  trouble getting potassium out of packs  ? ?How often do you forget or accidentally miss a dose? Rarely ? ?Do you use a pillbox? No ? ?Is patient in packaging Yes ? ? ?Refills requested from providers include: furosemide ? ?Confirmed delivery date of 07/11/21, advised patient that pharmacy will contact them the morning of delivery. ? ?Recent blood pressure readings are as follows:none available  ? ? ?Recent blood glucose readings are as  follows:none available  ? ? ?Annual wellness visit in last year? Yes ?Most Recent BP reading:108/60  70-P  06/04/21 ? ?If Diabetic: ?Most recent A1C reading:9.0  05/14/21 ?Last eye exam / retinopathy screening:2021 ?Last diabetic foot exam:UTD ? ?Lindsey FoltaCharlene Brooked ? ?Shaquanta Harkless, CCMA ?Health concierge  ?[phone]

## 2021-07-01 NOTE — Progress Notes (Signed)
Patient ID: Kathleen Hatfield                 DOB: 1955/09/30                    MRN: [id number] ? ? ? ? ?HPI: ?Kathleen Hatfield is a 66 y.o. female patient of Dr Burt Knack referred to lipid clinic by Richardson Dopp, PA. PMH is significant for HFpEF, aortic atherosclerosis, COPD, OSA, CKD, morbid obesity, HTN, DM, and HLD with intolerance to statins. Pt saw Richardson Dopp on 06/04/21 for CHF f/u. Pt was doing well, but reported knee pain and need for knee replacement surgery in the future. She stated she cannot tolerate statins, but has a family member who takes a PCSK9i and is interested in trying one. Pt had fasting labs drawn on 07/01/21 that showed elevated LDL to 208. Richardson Dopp advised pt to keep lipid clinic appt to discuss initiating PCSK9i.  ? ?Pt presents today for lipid clinic appt. She cannot tolerate statins and said her entire family cannot including her brother. He is on a PCSK9i and it has been working really well for him. She has never tried ezetimibe. She follows with Dr. [name] for her diabetes with her most recent A1c being 9% on 05/14/21. ? ?Current Medications: None ?Previously tried - rosuvastatin 81m once weekly, pravastatin 244monce weekly ?Intolerances: statins (atorvastatin, rosuvastatin, pravastatin) - leg and body weakness ?Risk Factors: HFpEF, ASCVD, DM, HTN, HLD, CKD ? ?LDL goal: <70 mg/dL ? ?Diet: Variety of food - low sugar/no sugar, low salt. No frozen foods, eats fast food a couple of times per week (cheeseburgers). Crackers with her medicine ? ?Exercise: Used to walk a lot, but cannot d/t bad knees ? ?Family History: heart disease (brother, father, mother), HTN (father, mother) ? ?Social History: Denies tobacco, illicit drug, and alcohol use ? ?Labs (06/30/21): TC 302, TG 270, HDL 40, LDL 208 (no medication) ? ?Past Medical History:  ?Diagnosis Date  ? Anemia   ? Arthritis   ? Asthma   ? Chronic diastolic CHF 0332/9518? Echocardiogram 05/2019: EF 70, no RWMA, mild LVH, Gr 1 DD, normal RVSF, severe LVH,  borderline asc Aorta (39 mm)  ? CKD (chronic kidney disease)   ? Depression   ? Diabetes mellitus without complication ([facility]  ? Diverticulitis   ? Dyspnea   ? with exertion  ? GERD (gastroesophageal reflux disease)   ? History of blood transfusion   ? Hyperlipidemia   ? cannot tolerate statins  ? Hypertension   ? patient states she has never had high blood pressure.   ? Nuclear stress test   ? Myoview 05/2019: EF 83, no ischemia or infarction; Low Risk  ? Peripheral neuropathy   ? Pneumonia   ? PONV (postoperative nausea and vomiting)   ? RLS (restless legs syndrome)   ? Sleep apnea   ? uses CPAP  ? Ulcerative colitis ([facility]CG[place]  ? dr maCollene Mares? ? ?Current Outpatient Medications on File Prior to Visit  ?Medication Sig Dispense Refill  ? acetaminophen (TYLENOL) 500 MG tablet Take 500 mg by mouth every 6 (six) hours as needed for moderate pain.    ? albuterol (VENTOLIN HFA) 108 (90 Base) MCG/ACT inhaler Inhale 2 puffs into the lungs every 6 (six) hours as needed. 17 g 2  ? aspirin EC 81 MG tablet Take 1 tablet (81 mg total) by mouth daily. Swallow whole. 90 tablet 3  ? BAYER CONTOUR  NEXT TEST test strip 1 each by Other route daily. As directed    ? budesonide (PULMICORT) 0.5 MG/2ML nebulizer solution Take 2 mLs (0.5 mg total) by nebulization 2 (two) times daily. 360 mL 3  ? buPROPion (WELLBUTRIN XL) 300 MG 24 hr tablet TAKE 1 TABLET BY MOUTH DAILY 90 tablet 2  ? Cholecalciferol (VITAMIN D-3) 5000 UNITS TABS Take 5,000 Units by mouth every other day.     ? colchicine 0.6 MG tablet TAKE 1 TABLET (0.6 MG TOTAL) BY MOUTH DAILY AS NEEDED (FOR GOUT). 30 tablet 1  ? dicyclomine (BENTYL) 20 MG tablet TAKE 1 TABLET UP TO FOUR TIMES A DAY FOR GI CRAMPING, PAIN, NAUSEA, AND VOMITING AS NEEDED 60 tablet 1  ? Dulaglutide (TRULICITY) 4.5 AL/9.3XT SOPN Inject 4.5 mg once weekly 2 mL 1  ? DULoxetine (CYMBALTA) 60 MG capsule TAKE 1 CAPSULE BY MOUTH ONCE DAILY. 90 capsule 3  ? fluticasone (FLONASE) 50 MCG/ACT nasal spray Place 2 sprays into  both nostrils daily. 9.9 mL 0  ? furosemide (LASIX) 20 MG tablet TAKE THREE TABLETS BY MOUTH EVERY MORNING and TAKE THREE TABLETS BY MOUTH AT NOON 180 tablet 0  ? gabapentin (NEURONTIN) 300 MG capsule Take 1 capsule (300 mg total) by mouth at bedtime. 90 capsule 1  ? GLOBAL EASE INJECT PEN NEEDLES 32G X 4 MM MISC USE AS DIRECTED WITH VICTOZA AND TRESIBA. 100 each 4  ? insulin degludec (TRESIBA FLEXTOUCH) 200 UNIT/ML FlexTouch Pen Inject 120 Units into the skin daily.    ? ipratropium-albuterol (DUONEB) 0.5-2.5 (3) MG/3ML SOLN Take 3 mLs by nebulization every 6 (six) hours as needed (wheezing). 360 mL 6  ? Iron, Ferrous Sulfate, 325 (65 Fe) MG TABS Take 325 mg by mouth every other day. 30 tablet 11  ? metolazone (ZAROXOLYN) 5 MG tablet Take 1 tablet (5 mg total) by mouth as needed. 30 tablet 1  ? MICROLET LANCETS MISC 1 each by Other route. As directed    ? nystatin (MYCOSTATIN/NYSTOP) powder Apply 1 application topically 3 (three) times daily. 60 g 1  ? ondansetron (ZOFRAN) 4 MG tablet TAKE ONE TABLET BY MOUTH every EIGHT hours AS NEEDED FOR NAUSEA AND VOMITING 20 tablet 1  ? oxyCODONE-acetaminophen (PERCOCET/ROXICET) 5-325 MG tablet Take 1 tablet by mouth every 6 (six) hours as needed for severe pain. 15 tablet 0  ? pantoprazole (PROTONIX) 40 MG tablet Take 1 tablet (40 mg total) by mouth daily. 90 tablet 0  ? polyethylene glycol (MIRALAX / GLYCOLAX) packet Take 17 g by mouth daily as needed for mild constipation.    ? potassium chloride (MICRO-K) 10 MEQ CR capsule TAKE 5 CAPSULES (50 MEQ TOTAL) BY MOUTH 2 (TWO) TIMES DAILY 300 capsule 5  ? promethazine-dextromethorphan (PROMETHAZINE-DM) 6.25-15 MG/5ML syrup Take 2.5 mLs by mouth 4 (four) times daily as needed for cough (sedation caution). 120 mL 1  ? propranolol (INDERAL) 10 MG tablet TAKE 1 TABLET BY MOUTH 2 TIMES DAILY 180 tablet 3  ? revefenacin (YUPELRI) 175 MCG/3ML nebulizer solution Inhale one vial in nebulizer once daily. Do not mix with other nebulized  medications. 90 mL 11  ? rOPINIRole (REQUIP) 4 MG tablet TAKE 1 TABLET BY MOUTH TWICE DAILY 180 tablet 3  ? spironolactone (ALDACTONE) 25 MG tablet TAKE ONE TABLET BY MOUTH ONCE DAILY 30 tablet 2  ? traMADol (ULTRAM) 50 MG tablet Take 1 tablet (50 mg total) by mouth 3 (three) times daily as needed. 30 tablet 1  ? UNABLE TO FIND  CPAP- At bedtime    ? ?No current facility-administered medications on file prior to visit.  ? ? ?Allergies  ?Allergen Reactions  ? Almond Oil Anaphylaxis, Shortness Of Breath and Swelling  ? Morphine And Related Shortness Of Breath and Other (See Comments)  ?  Pt. States while in the hospital it affected her breathing, O2 dropped to the 70's  ? Atorvastatin Other (See Comments)  ?  Leg weakness  ? Ceclor [Cefaclor] Nausea And Vomiting  ? Sulfa Antibiotics Nausea And Vomiting  ? Ciprofloxacin Other (See Comments)  ?  Makes joints and muscles ache  ? Levaquin [Levofloxacin] Other (See Comments)  ?  Body aches  ? Losartan Other (See Comments)  ?  Weakness ?  ? Statins Other (See Comments)  ?  Leg and body weakness  ? ? ?Assessment/Plan: ? ?1. Hyperlipidemia - LDL of 208 is above goal < 70 with prior statin intolerance. Discussed safety and efficacy profile of PCSK9i and pt amendable to therapy. Educated on potential side effects and demonstrated injection technique. Will submit prior authorization for Praluent 150 mg/mL Q2W and follow-up with pt once approved. She is ok with anticipated $47/month copay. PCSK9i alone unlikely to get her to goal despite 60% LDL lowering given her high baseline LDL. Will consider adding ezetimibe if needed pending response to PCKS9i. ? ?Discussed pt's elevated triglycerides and recommended decreasing amount of high carb, high sugar foods she is consuming including fast food. Her most recent A1c on 05/14/21 was 9%. Educated pt that elevated blood sugar levels can increase triglycerides and recommended she keep close follow-up with Dr. [name] who is managing her  diabetes. Vascepa unfortunately $100/month. ? ?Patient seen with Debria Garret, [place] pharmacy student ? ?Megan E. Supple, PharmD, BCACP, CPP ?[id number], [place]

## 2021-07-02 ENCOUNTER — Ambulatory Visit: Payer: Medicare HMO | Admitting: Pharmacist

## 2021-07-02 ENCOUNTER — Telehealth: Payer: Self-pay | Admitting: Pharmacist

## 2021-07-02 DIAGNOSIS — E782 Mixed hyperlipidemia: Secondary | ICD-10-CM

## 2021-07-02 MED ORDER — PRALUENT 150 MG/ML ~~LOC~~ SOAJ: 1.0000 "pen " | SUBCUTANEOUS | 11 refills | Status: DC

## 2021-07-02 NOTE — Patient Instructions (Addendum)
Current LDL is 208. Your LDL goal is < 70.  ?Current triglycerides is 270. Your triglyceride goal is < 150 ? ?I will submit information to your insurance for Praluent and let you know when I hear back.  ?  ?Praluent is a subcutaneous injection given once every 2 weeks in the fatty tissue of your stomach or upper outer thigh. Store the medication in the fridge. You can let your dose warm up to room temperature for 30 minutes before injecting if you prefer. Praluent will lower your LDL cholesterol by 60% and helps to lower your chance of having a heart attack or stroke. ? ?

## 2021-07-02 NOTE — Telephone Encounter (Signed)
Praluent prior authorization approved through 03/08/22. Rx sent to pharmacy. Scheduled f/u labs in 2 months to assess efficacy. Pt aware of med plan. ?

## 2021-07-04 ENCOUNTER — Ambulatory Visit: Payer: Medicare HMO | Admitting: Pharmacist

## 2021-07-04 DIAGNOSIS — E782 Mixed hyperlipidemia: Secondary | ICD-10-CM

## 2021-07-04 DIAGNOSIS — I10 Essential (primary) hypertension: Secondary | ICD-10-CM

## 2021-07-04 DIAGNOSIS — E1165 Type 2 diabetes mellitus with hyperglycemia: Secondary | ICD-10-CM

## 2021-07-04 DIAGNOSIS — I5032 Chronic diastolic (congestive) heart failure: Secondary | ICD-10-CM

## 2021-07-04 DIAGNOSIS — J449 Chronic obstructive pulmonary disease, unspecified: Secondary | ICD-10-CM

## 2021-07-04 NOTE — Progress Notes (Signed)
? ?Chronic Care Management ?Pharmacy Note ? ?04/07/2021 ?Name:  Kathleen Hatfield MRN:  [id number] DOB:  1956-03-04 ? ?Summary: CCM F/U visit ?-Pt reports medication adherence has improved with pill packs, she want to adjust timings of some medication, notified [facility] of changes ?-Recent LDL 208, has been prescribed Praluent, not started yet ?-Pt has seen Endocrinology and A1c improved to 9.0% recently; she still forgets to take Insulin and Trulicity fairly often due to timing in middle of day ?-Fasting BG improved: 101, 165, 117, 148. Pt denies hypoglycemia ? ?Recommendations:  ?-Recommend moving insulin and Trulicity to bedtime to improve adherence ?-Advised to start Praluent as directed ? ?Plan: ?-[place] will call patient monthly for medication coordination ?-Pharmacist follow up televisit scheduled for 2 months ? ? ? ?Subjective: ?Kathleen Hatfield is an 66 y.o. year old female who is a primary patient of [name], [name], MD.  The CCM team was consulted for assistance with disease management and care coordination needs.   ? ?Engaged with patient by telephone for follow up visit in response to provider referral for pharmacy case management and/or care coordination services.  ? ?Consent to Services:  ?The patient was given information about Chronic Care Management services, agreed to services, and gave verbal consent prior to initiation of services.  Please see initial visit note for detailed documentation.  ? ?Patient Care Team: ?[name], MD as PCP - General (Family Medicine) ?[name], MD as PCP - Cardiology (Cardiology) ?[name], MD as Consulting Physician (Pulmonary Disease) ?[name], MD as Consulting Physician (General Surgery) ?[place], MD as Consulting Physician (Pulmonary Disease) ?Dannielle Karvonen, RN as Case Manager ?, Cleaster Corin, [facility] as Pharmacist (Pharmacist) ?[name] as Physician Assistant (Cardiology) ? ?Recent office visits:   ?03/11/21 - [name], MD, PCP - Pt presented for diabetes follow up. Increase insulin by 2 units/day for BP > 150. If you have more ankle swelling take 1 dose of metolazone.  Update me about your breathing and sugar next week.  ?02/27/21 - [name], MD, PCP - Pt presented for hospital follow up. Marland Kitchen  She was admitted with flu with hyperglycemia exacerbated by steroid use.  Improved in the meantime.  Discussed taking 1 dose of metolazone to see if that helped with her breathing and cough in the meantime.  She can use Tessalon for cough. ?02/07/21 - Alma Friendly, NP - Pt presented for COPD exacerbation. Start doxycycline x 10 days and prednisone 40 mg x 5 days. Adjust insulin by 10 units for BG > 200.   ?11/28/20 - [name], MD, PCP - Pt presented for diabetes follow up and CHF. Stop meloxicam. Updated labs. A1c elevated, Referral to endocrinology. BNP normal, hold metolazone. Low iron, start supplementation.  ?  ?Recent consult visits:  ?07/02/21 [place] PharmD (Lipid clinic): LDL 208. Start Praluent 150 mg q14d. ? ?06/04/21-[name],PA(Cardiology)-F/U CHF-Recent creatinine stable. Arrange fasting Lipids(abnormal)  Refer to PharmD [facility]. Start ASA 81 mg once daily. ? ?05/14/21 Dr [name] (Endocrine):  Establish care DM. Advised mealtime insulin, pt declined. Increased Tresiba to 120 units. ?  ?Hospital visits: ?03/31/21 - ED visit, atypical chest pain ?02/14/21 - 02/18/21 - Admission, COPD exacerbation  ?02/08/21 - ED, COPD exacerbation ?12/12/20 - ED visit, acute gout ? ?Objective: ? ?Lab Results  ?Component Value Date  ? CREATININE 1.24 (H) 03/31/2021  ? BUN 20 03/31/2021  ? GFR 48.76 (L) 02/27/2021  ? GFRNONAA 48 (L)  03/31/2021  ? GFRAA 69 01/19/2020  ? NA 137 03/31/2021  ? K 3.8 03/31/2021  ? CALCIUM 10.0 03/31/2021  ? CO2 34 (H) 03/31/2021  ? GLUCOSE 210 (H) 03/31/2021  ? ? ?Lab Results  ?Component Value Date/Time  ? HGBA1C 9.0 (A) 05/14/2021 03:10 PM  ? HGBA1C 10.9 (H) 02/14/2021 06:59 AM   ? HGBA1C 9.4 (H) 11/28/2020 03:56 PM  ? GFR 48.76 (L) 02/27/2021 12:16 PM  ? GFR 38.07 (L) 11/28/2020 03:56 PM  ?  ?Last diabetic Eye exam:  ?Lab Results  ?Component Value Date/Time  ? HMDIABEYEEXA No Retinopathy 12/22/2019 12:00 AM  ?  ?Last diabetic Foot exam: completed by PCP 11/28/2020 - normal   ? ?Lab Results  ?Component Value Date  ? CHOL 302 (H) 06/30/2021  ? HDL 40 06/30/2021  ? LDLCALC 208 (H) 06/30/2021  ? TRIG 270 (H) 06/30/2021  ? CHOLHDL 7.6 (H) 06/30/2021  ? ? ? ?  Latest Ref Rng & Units 03/31/2021  ?  8:03 PM 11/28/2020  ?  3:56 PM 09/20/2020  ?  1:14 AM  ?Hepatic Function  ?Total Protein 6.5 - 8.1 g/dL 7.3   7.0   7.2    ?Albumin 3.5 - 5.0 g/dL 3.5   3.8   3.3    ?AST 15 - 41 U/L _0 ?ALT 0 - 44 U/L _1 ?Alk Phosphatase 38 - 126 U/L 88   92   83    ?Total Bilirubin 0.3 - 1.2 mg/dL 1.4   0.3   0.4    ?Bilirubin, Direct 0.0 - 0.2 mg/dL 0.4      ? ? ?Lab Results  ?Component Value Date/Time  ? TSH 2.850 06/09/2019 12:50 PM  ? TSH 2.31 04/15/2015 03:45 PM  ? ? ? ?  Latest Ref Rng & Units 03/31/2021  ?  6:06 PM 02/27/2021  ? 12:16 PM 02/16/2021  ? 12:40 AM  ?CBC  ?WBC 4.0 - 10.5 K/uL 9.7   10.6   16.1    ?Hemoglobin 12.0 - 15.0 g/dL 13.6   13.6   13.7    ?Hematocrit 36.0 - 46.0 % 41.6   41.9   42.3    ?Platelets 150 - 400 K/uL 328   360.0   259    ? ? ?Lab Results  ?Component Value Date/Time  ? VD25OH 45.52 06/18/2020 04:52 PM  ? VD25OH 47.60 06/22/2019 01:59 PM  ? ? ?Clinical ASCVD: No  ?The 10-year ASCVD risk score (Arnett DK, et al., 2019) is: 25.9% ?  Values used to calculate the score: ?    Age: 49 years ?    Sex: Female ?    Is Non-Hispanic African American: No ?    Diabetic: Yes ?    Tobacco smoker: Yes ?    Systolic Blood Pressure: 998 mmHg ?    Is BP treated: Yes ?    HDL Cholesterol: 40 mg/dL ?    Total Cholesterol: 302 mg/dL   ? ? ?  08/12/2020  ?  1:54 PM 12/28/2017  ?  2:09 PM 09/13/2017  ?  4:28 PM  ?Depression screen PHQ 2/9  ?Decreased Interest _2 ?Down, Depressed,  Hopeless _3 ?PHQ - 2 Score _4 ?Altered sleeping _5 ?Tired, decreased energy _6 ?Change in appetite 2 0 1  ?Feeling bad or failure about  yourself  _0 ?Trouble concentrating 3 0 3  ?Moving slowly or fidgety/restless _1 ?Suicidal thoughts 0 0 0  ?PHQ-9 Score _2 ?Difficult doing work/chores Somewhat difficult Very difficult Very difficult  ?  ?Social History  ? ?Tobacco Use  ?Smoking Status Never  ?Smokeless Tobacco Never  ? ?BP Readings from Last 3 Encounters:  ?06/04/21 108/60  ?05/14/21 126/72  ?03/31/21 118/65  ? ?Pulse Readings from Last 3 Encounters:  ?06/04/21 70  ?05/14/21 84  ?03/31/21 60  ? ?Wt Readings from Last 3 Encounters:  ?06/04/21 256 lb 3.2 oz (116.2 kg)  ?05/14/21 255 lb 6.4 oz (115.8 kg)  ?03/31/21 260 lb 2.3 oz (118 kg)  ? ?BMI Readings from Last 3 Encounters:  ?06/04/21 42.63 kg/m?  ?05/14/21 42.50 kg/m?  ?03/31/21 43.29 kg/m?  ? ?Iron/TIBC/Ferritin/ %Sat ?   ?Component Value Date/Time  ? IRON 37 (L) 11/28/2020 1556  ? IRON 66 09/19/2019 0000  ? TIBC 430 09/19/2019 0000  ? FERRITIN 64.4 11/28/2020 1556  ? FERRITIN 50 09/19/2019 0000  ? IRONPCTSAT 15 09/19/2019 0000  ? ? ?Assessment/Interventions: Review of patient past medical history, allergies, medications, health status, including review of consultants reports, laboratory and other test data, was performed as part of comprehensive evaluation and provision of chronic care management services.  ? ?SDOH:  (Social Determinants of Health) assessments and interventions performed: Yes ? ?SDOH Screenings  ? ?Alcohol Screen: Not on file  ?Depression (PHQ2-9): Medium Risk  ? PHQ-2 Score: 20  ?Financial Resource Strain: Low Risk   ? Difficulty of Paying Living Expenses: Not very hard  ?Food Insecurity: No Food Insecurity  ? Worried About Charity fundraiser in the Last Year: Never true  ? Ran Out of Food in the Last Year: Never true  ?Housing: Not on file  ?Physical Activity: Inactive  ? Days of Exercise per Week: 0 days  ?  Minutes of Exercise per Session: 0 min  ?Social Connections: Not on file  ?Stress: Not on file  ?Tobacco Use: Low Risk   ? Smoking Tobacco Use: Never  ? Smokeless Tobacco Use: Never  ? Passive Exposure: Not on

## 2021-07-04 NOTE — Patient Instructions (Signed)
Visit Information ? ?Phone number for Pharmacist: [phone] ? ? Goals Addressed   ?None ?  ? ? ?Care Plan : [place]  ?Updates made by [name], [place] since 07/04/2021 12:00 AM  ?  ? ?Problem: Hypertension, Hyperlipidemia, Diabetes, Heart Failure, COPD, Asthma, Chronic Kidney Disease, and Depression   ?Priority: High  ?  ? ?Long-Range Goal: Disease Management   ?Start Date: 07/29/2020  ?Expected End Date: 05/24/2022  ?This Visit's Progress: On track  ?Recent Progress: Not on track  ?Priority: High  ?Note:   ?Current Barriers:  ?Unable to achieve control of diabetes  ?Unable to achieve control COPD, frequent exacerbations ?Does not maintain contact with specialists  ?Difficulty taking medications as prescribed ? ?Pharmacist Clinical Goal(s):  ?Patient will contact provider office for questions/concerns as evidenced notation of same in electronic health record through collaboration with PharmD and provider.  ? ?Interventions: ?1:1 collaboration with [name], MD regarding development and update of comprehensive plan of care as evidenced by provider attestation and co-signature ?Inter-disciplinary care team collaboration (see longitudinal plan of care) ?Comprehensive medication review performed; medication list updated in electronic medical record ? ?Hyperlipidemia: (LDL goal < 70) ?-Uncontrolled - LDL 208 (06/2021), recently seen by lipid clinic and started Praluent. TRIG 270 likely related to uncontrolled DM ?-High ASCVD risk (25%) ?-Current treatment: ?Praluent 150 mg q 14 days - Appropriate, Query Effective (not started yet) ?-Medications previously tried: Multiple statins (atorvastatin, others) - leg weakness; Vascepa (cost) ?-Educated on Cholesterol goals; benefits of Prauluent ?-Recommend to continue current medication; follow up with cardiology for lab work as scheduled ? ?Diabetes (A1c goal <7%) ?-Uncontrolled, improving- A1c 9.0 (05/2021), pt endorses improvement in BG on higher  dose of insulin and Trulicity. She does report she forgets insulin and Trulicity doses sometimes. ?-Follows with Dr [name] (established care 05/2021). Dx 2017, insulin since 2020. Hx of chronic UTIs, would avoid SGLT2-inhibitor ?-Fasting BG: 101, 165, 117, 148. Denies hypoglycemia < 70 ?-Current medications: ?Trulicity 4.5 mg weekly (Thurs) - Appropriate, Query Effective ?Tresiba 120 units daily (midday) -Appropriate, Query Effective ?-Medications previously tried: Jardiance, Victoza, metformin ?-Current meal patterns: The patient reports she is on a salt free, sugar free, gluten free diet but had a hard time sticking to this. She tries to limit portions.  ?-Recommend to take [place] and Trulicity at bedtime to improve adherence ? ?Heart Failure (Goal: manage symptoms and prevent exacerbations) ?-Controlled - per pt report of symptoms. She has not been checking her weight regularly; pt has previously had trouble swallowing large tablets but does well with Potassium 10 mEq capsules.  ?-Followed by Dr. [name] with [facility] ?-Last ejection fraction: 70% (Date: 03/21) ?-HF type: Diastolic; NYHA Class: II (slight limitation of activity) ?-Current treatment: ?Spironolactone 25 mg daily AM - Appropriate, Effective, Safe, Accessible ?Furosemide 20 mg - 3 tab BID- Appropriate, Effective, Safe, Accessible ?Potassium 10 mEq - 4 AM, 2 PM, 4 HS - Appropriate, Effective, Safe, Accessible ?Metolazone 2.5 mg PRN wt gain - Appropriate, Effective, Safe, Accessible ?-Medications previously tried: torsemide, bumetanide, losartan  ?-Recommended to continue current medication; ? ?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) ?-Improving - pt has had multiple hospitalizations for COPD exacerbation in 2022; she is now taking her nebulizers as directed and reports doing better overall, using Duoneb and albuterol rescue less often; she wears Oxygen at night and PRN during the day;  ?-Followed by Dr  Halford Chessman, [facility]. Pt does not follow up with pulmonology  as frequently as recommended.  ?-Pulmonary function testing: 2021 FEV1 42% predicted ?-Gold Grade: Gold 3 (FEV1 30-49%) ?-Current COPD Classification:  E (high sx, >2 exacerbations/yr) ?-Exacerbations requiring treatment in last 6 months: yes, multiple ?-Compliant with CPAP nightly ?-Current regimen: ?Pulmicort nebuilzer BID - Appropriate, Effective, Safe, Accessible ?Yupelri nebulizer daily -Appropriate, Effective, Safe, Accessible ?Albuterol/ipratropium nebulizer - takes 2-3 times per day as needed - Appropriate, Effective, Safe, Accessible ?Albuterol inhaler - 2 puffs every 6 hours PRN - Appropriate, Effective, Safe, Accessible ?-Medications previously tried: per pt inhalers less effective than nebulizers, Perforomist was unaffordable; She was previously on Advair, Incruse and most recently Trelegy but says she has stopped these because she does not like the powder inhalers. ?-Recommended to continue current medication; follow up with pulmonology more regularly ? ?Depression/Anxiety/Pain(Goal: Control symptoms) ?-Controlled, stable per patient; she asked about changing gabapentin to 300 mg dose due to pill burden ?-Current treatment: ?Bupropion 300 mg daily - Appropriate, Effective, Safe, Accessible ?Cymbalta 60 mg daily - Appropriate, Effective, Safe, Accessible ?Gabapentin 100 mg - 3 caps HS - Appropriate, Effective, Safe, Accessible ?Ropinirole 4 mg BID (RLS) - Appropriate, Effective, Query Safe ?-Medications previously tried/failed: none ?-Concern that ropinirole may be causing her to fall asleep suddenly during the day. This has been discussed with patient before and per chart records, she was unable to tolerate dose reductions. We will revisit this issue periodically ?-Recommend to continue current medication ? ?Medication management (Goal: improve adherence) ?-Updated pill packs per patient preference to improve experience and  adherence ?PACKS: ?Breakfast (10-11 AM) ?1 Bupropion 300 mg XR  ?1 Vitamin D3 5000 IU - every other day  ?1 Ropinirole 4 mg ?1 Pantoprazole 40 mg  ?1 Spironolactone 25 mg  ?1 Propranolol 10 mg  ?3 furosemide 20 mg  ?2 Mesalamine 1.2 gm ? ?Mid- day (2-3 PM) ?10 potassium 10 mEq (5 at a time, separated by 1-2 hrs) ? ?Evening Meal (5-6 pm) ?3 Furosemide 20 mg ?1 Ferrous sulfate QOD 325 mg  ?2 Mesalamine 1.2 gm ? ?Bedtime (10 PM) ?1 gabapentin 300 mg  ?1 Duloxetine 60 mg  ?1 propranolol 10 mg ?1 ropinirole 4 mg  ?1 cetirizine 10 mg ? ?Other /VIALS: ?Injections: ?-[place] daily (filled 30 day supply 04/07/21) ?-Trulicity 4.5 mg on Thurs or Friday (filled 30 day supply 04/07/21) ?-Praluent 150 mg q 2 weeks ? ?As needed:  ?-Dicyclomine - uses as needed for flares ?-Tylenol - 1 before bedtime ?-Meloxicam 15 mg - short term for arthritis in ankle ?-Metolazone - for fluid overload  ?-Tramadol - PRN diverticulitis ?-Colchicine 0.6 mg PRN ? ?Patient Goals/Self-Care Activities ?Patient will:  ?- work with pharmacist to improve medication adherence through pill packaging ?-Follow up with pulmonology ?  ?  ? ?Patient verbalizes understanding of instructions and care plan provided today and agrees to view in [place]. Active MyChart status confirmed with patient.   ?Telephone follow up appointment with pharmacy team member scheduled for: 2 months ? ?[name], PharmD, BCACP ?Clinical Pharmacist ?[facility] ?[phone] ?  ?

## 2021-07-06 DIAGNOSIS — E1159 Type 2 diabetes mellitus with other circulatory complications: Secondary | ICD-10-CM | POA: Diagnosis not present

## 2021-07-06 DIAGNOSIS — E785 Hyperlipidemia, unspecified: Secondary | ICD-10-CM | POA: Diagnosis not present

## 2021-07-06 DIAGNOSIS — J440 Chronic obstructive pulmonary disease with acute lower respiratory infection: Secondary | ICD-10-CM

## 2021-07-06 DIAGNOSIS — Z794 Long term (current) use of insulin: Secondary | ICD-10-CM | POA: Diagnosis not present

## 2021-07-06 DIAGNOSIS — I110 Hypertensive heart disease with heart failure: Secondary | ICD-10-CM

## 2021-07-06 DIAGNOSIS — I5030 Unspecified diastolic (congestive) heart failure: Secondary | ICD-10-CM | POA: Diagnosis not present

## 2021-07-06 DIAGNOSIS — J45909 Unspecified asthma, uncomplicated: Secondary | ICD-10-CM

## 2021-07-08 ENCOUNTER — Telehealth: Payer: Self-pay | Admitting: Pulmonary Disease

## 2021-07-08 NOTE — Telephone Encounter (Signed)
Received fax that Kathleen Hatfield was approved for a year.  ?

## 2021-07-10 ENCOUNTER — Ambulatory Visit (INDEPENDENT_AMBULATORY_CARE_PROVIDER_SITE_OTHER): Payer: Medicare HMO | Admitting: *Deleted

## 2021-07-10 DIAGNOSIS — Z0000 Encounter for general adult medical examination without abnormal findings: Secondary | ICD-10-CM

## 2021-07-10 DIAGNOSIS — Z780 Asymptomatic menopausal state: Secondary | ICD-10-CM | POA: Diagnosis not present

## 2021-07-10 DIAGNOSIS — Z1231 Encounter for screening mammogram for malignant neoplasm of breast: Secondary | ICD-10-CM

## 2021-07-10 DIAGNOSIS — Z1211 Encounter for screening for malignant neoplasm of colon: Secondary | ICD-10-CM | POA: Diagnosis not present

## 2021-07-10 NOTE — Progress Notes (Signed)
? ?Subjective:  ? Kathleen Hatfield is a 66 y.o. female who presents for Medicare Annual (Subsequent) preventive examination. ? ?I connected with  Kathleen Hatfield on 07/10/21 by a  telephone enabled telemedicine application and verified that I am speaking with the correct person using two identifiers. ?  ?I discussed the limitations of evaluation and management by telemedicine. The patient expressed understanding and agreed to proceed. ? ?Patient location: home ? ?Provider location: Tele-Health -home ? ? ?Review of Systems    ? ?Cardiac Risk Factors include: advanced age (>83mn, >>34women);diabetes mellitus;obesity (BMI >30kg/m2);sedentary lifestyle;hypertension ? ?   ?Objective:  ?  ?Today's Vitals  ? ?There is no height or weight on file to calculate BMI. ? ? ?  07/10/2021  ? 11:05 AM 02/14/2021  ?  6:00 PM 02/08/2021  ? 11:41 AM 12/12/2020  ?  4:30 PM 09/19/2020  ?  8:59 PM 09/19/2020  ?  3:31 PM 08/12/2020  ?  1:41 PM  ?Advanced Directives  ?Does Patient Have a Medical Advance Directive? Yes Yes No Yes Yes No Yes  ?Type of Advance Directive Healthcare Power of Attorney Living will;Healthcare Power of [place]  [place]  ?Does patient want to make changes to medical advance directive?  No - Patient declined   No - Patient declined  No - Patient declined  ?Copy of [place] Chart? No - copy requested No - copy requested   No - copy requested    ?Would patient like information on creating a medical advance directive?     No - Patient declined No - Patient declined   ? ? ?Current Medications (verified) ?Outpatient Encounter Medications as of 07/10/2021  ?Medication Sig  ? acetaminophen (TYLENOL) 500 MG tablet Take 500 mg by mouth every 6 (six) hours as needed for moderate pain.  ? albuterol (VENTOLIN HFA) 108 (90 Base) MCG/ACT inhaler Inhale 2 puffs into the lungs every 6 (six) hours as needed.  ?  Alirocumab (PRALUENT) 150 MG/ML SOAJ Inject 1 pen. into the skin every 14 (fourteen) days.  ? aspirin EC 81 MG tablet Take 1 tablet (81 mg total) by mouth daily. Swallow whole.  ? BAYER CONTOUR NEXT TEST test strip 1 each by Other route daily. As directed  ? budesonide (PULMICORT) 0.5 MG/2ML nebulizer solution Take 2 mLs (0.5 mg total) by nebulization 2 (two) times daily.  ? buPROPion (WELLBUTRIN XL) 300 MG 24 hr tablet TAKE 1 TABLET BY MOUTH DAILY  ? Cholecalciferol (VITAMIN D-3) 5000 UNITS TABS Take 5,000 Units by mouth every other day.   ? colchicine 0.6 MG tablet TAKE 1 TABLET (0.6 MG TOTAL) BY MOUTH DAILY AS NEEDED (FOR GOUT).  ? dicyclomine (BENTYL) 20 MG tablet TAKE 1 TABLET UP TO FOUR TIMES A DAY FOR GI CRAMPING, PAIN, NAUSEA, AND VOMITING AS NEEDED  ? Dulaglutide (TRULICITY) 4.5 MKG/2.5KYSOPN Inject 4.5 mg once weekly  ? DULoxetine (CYMBALTA) 60 MG capsule TAKE 1 CAPSULE BY MOUTH ONCE DAILY.  ? fluticasone (FLONASE) 50 MCG/ACT nasal spray Place 2 sprays into both nostrils daily.  ? furosemide (LASIX) 20 MG tablet TAKE THREE TABLETS BY MOUTH EVERY MORNING and TAKE THREE TABLETS BY MOUTH AT NOON  ? gabapentin (NEURONTIN) 300 MG capsule Take 1 capsule (300 mg total) by mouth at bedtime.  ? GLOBAL EASE INJECT PEN NEEDLES 32G X 4 MM MISC USE AS DIRECTED WITH VICTOZA AND TRESIBA.  ?  insulin degludec (TRESIBA FLEXTOUCH) 200 UNIT/ML FlexTouch Pen Inject 120 Units into the skin daily.  ? ipratropium-albuterol (DUONEB) 0.5-2.5 (3) MG/3ML SOLN Take 3 mLs by nebulization every 6 (six) hours as needed (wheezing).  ? Iron, Ferrous Sulfate, 325 (65 Fe) MG TABS Take 325 mg by mouth every other day.  ? metolazone (ZAROXOLYN) 5 MG tablet Take 1 tablet (5 mg total) by mouth as needed.  ? MICROLET LANCETS MISC 1 each by Other route. As directed  ? nystatin (MYCOSTATIN/NYSTOP) powder Apply 1 application topically 3 (three) times daily.  ? ondansetron (ZOFRAN) 4 MG tablet TAKE ONE TABLET BY MOUTH every EIGHT hours AS NEEDED FOR  NAUSEA AND VOMITING  ? oxyCODONE-acetaminophen (PERCOCET/ROXICET) 5-325 MG tablet Take 1 tablet by mouth every 6 (six) hours as needed for severe pain.  ? pantoprazole (PROTONIX) 40 MG tablet Take 1 tablet (40 mg total) by mouth daily.  ? polyethylene glycol (MIRALAX / GLYCOLAX) packet Take 17 g by mouth daily as needed for mild constipation.  ? potassium chloride (MICRO-K) 10 MEQ CR capsule TAKE 5 CAPSULES (50 MEQ TOTAL) BY MOUTH 2 (TWO) TIMES DAILY  ? promethazine-dextromethorphan (PROMETHAZINE-DM) 6.25-15 MG/5ML syrup Take 2.5 mLs by mouth 4 (four) times daily as needed for cough (sedation caution).  ? propranolol (INDERAL) 10 MG tablet TAKE 1 TABLET BY MOUTH 2 TIMES DAILY  ? revefenacin (YUPELRI) 175 MCG/3ML nebulizer solution Inhale one vial in nebulizer once daily. Do not mix with other nebulized medications.  ? rOPINIRole (REQUIP) 4 MG tablet TAKE 1 TABLET BY MOUTH TWICE DAILY  ? spironolactone (ALDACTONE) 25 MG tablet TAKE ONE TABLET BY MOUTH ONCE DAILY  ? traMADol (ULTRAM) 50 MG tablet Take 1 tablet (50 mg total) by mouth 3 (three) times daily as needed.  ? UNABLE TO FIND CPAP- At bedtime  ? ?No facility-administered encounter medications on file as of 07/10/2021.  ? ? ?Allergies (verified) ?Almond oil, Morphine and related, Atorvastatin, Ceclor [cefaclor], Sulfa antibiotics, Ciprofloxacin, Levaquin [levofloxacin], Losartan, and Statins  ? ?History: ?Past Medical History:  ?Diagnosis Date  ? Anemia   ? Arthritis   ? Asthma   ? Chronic diastolic CHF 41/9622  ? Echocardiogram 05/2019: EF 70, no RWMA, mild LVH, Gr 1 DD, normal RVSF, severe LVH, borderline asc Aorta (39 mm)  ? CKD (chronic kidney disease)   ? Depression   ? Diabetes mellitus without complication ([place])   ? Diverticulitis   ? Dyspnea   ? with exertion  ? GERD (gastroesophageal reflux disease)   ? History of blood transfusion   ? Hyperlipidemia   ? cannot tolerate statins  ? Hypertension   ? patient states she has never had high blood pressure.   ?  Nuclear stress test   ? Myoview 05/2019: EF 83, no ischemia or infarction; Low Risk  ? Peripheral neuropathy   ? Pneumonia   ? PONV (postoperative nausea and vomiting)   ? RLS (restless legs syndrome)   ? Sleep apnea   ? uses CPAP  ? Ulcerative colitis ([place])   ? dr Collene Mares  ? ?Past Surgical History:  ?Procedure Laterality Date  ? ABDOMINAL HYSTERECTOMY    ? APPENDECTOMY    ? BREAST BIOPSY    ? BREAST SURGERY    ? left biopsy  ? CARDIAC CATHETERIZATION    ? CESAREAN SECTION    ? CHOLECYSTECTOMY    ? HERNIA REPAIR    ? INSERTION OF MESH N/A 11/15/2015  ? Procedure: INSERTION OF MESH;  Surgeon: [name], MD;  Location: [facility] ORS;  Service: General;  Laterality: N/A;  ? LAPAROSCOPIC LYSIS OF ADHESIONS N/A 11/15/2015  ? Procedure: LAPAROSCOPIC LYSIS OF ADHESIONS;  Surgeon: [name], MD;  Location: [facility];  Service: General;  Laterality: N/A;  ? RIGHT HEART CATHETERIZATION N/A 02/27/2013  ? Procedure: RIGHT HEART CATH;  Surgeon: Blane Ohara, MD;  Location: [facility] CATH LAB;  Service: Cardiovascular;  Laterality: N/A;  ? RIGHT HEART CATHETERIZATION N/A 12/14/2013  ? Procedure: RIGHT HEART CATH;  Surgeon: Larey Dresser, MD;  Location: [facility] CATH LAB;  Service: Cardiovascular;  Laterality: N/A;  ? SIGMOIDECTOMY  2010  ? diverticular disease  ? VENTRAL HERNIA REPAIR N/A 11/15/2015  ? Procedure: LAPAROSCOPIC VENTRAL WALL HERNIA REPAIR;  Surgeon: [name], MD;  Location: [facility] OR[facility];  Service: General;  Laterality: N/A;  ? ?Family History  ?Problem Relation Age of Onset  ? Heart disease Mother   ? Hypertension Mother   ? Dementia Mother   ? Heart disease Father   ? COPD Father   ? Hypertension Father   ? Heart disease Brother   ? Other Brother   ?     knee replacement; hip replacement  ? Other Brother   ?     cant brathe when laying down  ? Diabetes Maternal Grandmother   ? Breast cancer Neg Hx   ? Colon cancer Neg Hx   ? ?Social History  ? ?Socioeconomic History  ? Marital status: Divorced  ?  Spouse name: Not on file  ? Number of  children: Not on file  ? Years of education: Not on file  ? Highest education level: Not on file  ?Occupational History  ? Occupation: unemployed  ?Tobacco Use  ? Smoking status: Never  ? Smokeless tobac

## 2021-07-10 NOTE — Patient Instructions (Signed)
Kathleen Hatfield , ?Thank you for taking time to come for your Medicare Wellness Visit. I appreciate your ongoing commitment to your health goals. Please review the following plan we discussed and let me know if I can assist you in the future.  ? ?Screening recommendations/referrals: ?Colonoscopy: Education provided ?Mammogram: Education provided ?Bone Density: Education provided ?Recommended yearly ophthalmology/optometry visit for glaucoma screening and checkup ?Recommended yearly dental visit for hygiene and checkup ? ?Vaccinations: ?Influenza vaccine: Education provided ?Pneumococcal vaccine: Education provided ?Tdap vaccine: Education provided ?Shingles vaccine: Education provided   ? ?Advanced directives: Education provide ? ?Conditions/risks identified:  ? ? ? ? ?Preventive Care 35 Years and Older, Female ?Preventive care refers to lifestyle choices and visits with your health care provider that can promote health and wellness. ?What does preventive care include? ?A yearly physical exam. This is also called an annual well check. ?Dental exams once or twice a year. ?Routine eye exams. Ask your health care provider how often you should have your eyes checked. ?Personal lifestyle choices, including: ?Daily care of your teeth and gums. ?Regular physical activity. ?Eating a healthy diet. ?Avoiding tobacco and drug use. ?Limiting alcohol use. ?Practicing safe sex. ?Taking low-dose aspirin every day. ?Taking vitamin and mineral supplements as recommended by your health care provider. ?What happens during an annual well check? ?The services and screenings done by your health care provider during your annual well check will depend on your age, overall health, lifestyle risk factors, and family history of disease. ?Counseling  ?Your health care provider may ask you questions about your: ?Alcohol use. ?Tobacco use. ?Drug use. ?Emotional well-being. ?Home and relationship well-being. ?Sexual activity. ?Eating habits. ?History of  falls. ?Memory and ability to understand (cognition). ?Work and work Statistician. ?Reproductive health. ?Screening  ?You may have the following tests or measurements: ?Height, weight, and BMI. ?Blood pressure. ?Lipid and cholesterol levels. These may be checked every 5 years, or more frequently if you are over 51 years old. ?Skin check. ?Lung cancer screening. You may have this screening every year starting at age 58 if you have a 30-pack-year history of smoking and currently smoke or have quit within the past 15 years. ?Fecal occult blood test (FOBT) of the stool. You may have this test every year starting at age 62. ?Flexible sigmoidoscopy or colonoscopy. You may have a sigmoidoscopy every 5 years or a colonoscopy every 10 years starting at age 62. ?Hepatitis C blood test. ?Hepatitis B blood test. ?Sexually transmitted disease (STD) testing. ?Diabetes screening. This is done by checking your blood sugar (glucose) after you have not eaten for a while (fasting). You may have this done every 1-3 years. ?Bone density scan. This is done to screen for osteoporosis. You may have this done starting at age 55. ?Mammogram. This may be done every 1-2 years. Talk to your health care provider about how often you should have regular mammograms. ?Talk with your health care provider about your test results, treatment options, and if necessary, the need for more tests. ?Vaccines  ?Your health care provider may recommend certain vaccines, such as: ?Influenza vaccine. This is recommended every year. ?Tetanus, diphtheria, and acellular pertussis (Tdap, Td) vaccine. You may need a Td booster every 10 years. ?Zoster vaccine. You may need this after age 79. ?Pneumococcal 13-valent conjugate (PCV13) vaccine. One dose is recommended after age 53. ?Pneumococcal polysaccharide (PPSV23) vaccine. One dose is recommended after age 74. ?Talk to your health care provider about which screenings and vaccines you need and how often you  need  them. ?This information is not intended to replace advice given to you by your health care provider. Make sure you discuss any questions you have with your health care provider. ?Document Released: 03/22/2015 Document Revised: 11/13/2015 Document Reviewed: 12/25/2014 ?Elsevier Interactive Patient Education ? 2017 [place] ? ?Fall Prevention in the Home ?Falls can cause injuries. They can happen to people of all ages. There are many things you can do to make your home safe and to help prevent falls. ?What can I do on the outside of my home? ?Regularly fix the edges of walkways and driveways and fix any cracks. ?Remove anything that might make you trip as you walk through a door, such as a raised step or threshold. ?Trim any bushes or trees on the path to your home. ?Use bright outdoor lighting. ?Clear any walking paths of anything that might make someone trip, such as rocks or tools. ?Regularly check to see if handrails are loose or broken. Make sure that both sides of any steps have handrails. ?Any raised decks and porches should have guardrails on the edges. ?Have any leaves, snow, or ice cleared regularly. ?Use sand or salt on walking paths during winter. ?Clean up any spills in your garage right away. This includes oil or grease spills. ?What can I do in the bathroom? ?Use night lights. ?Install grab bars by the toilet and in the tub and shower. Do not use towel bars as grab bars. ?Use non-skid mats or decals in the tub or shower. ?If you need to sit down in the shower, use a plastic, non-slip stool. ?Keep the floor dry. Clean up any water that spills on the floor as soon as it happens. ?Remove soap buildup in the tub or shower regularly. ?Attach bath mats securely with double-sided non-slip rug tape. ?Do not have throw rugs and other things on the floor that can make you trip. ?What can I do in the bedroom? ?Use night lights. ?Make sure that you have a light by your bed that is easy to reach. ?Do not use  any sheets or blankets that are too big for your bed. They should not hang down onto the floor. ?Have a firm chair that has side arms. You can use this for support while you get dressed. ?Do not have throw rugs and other things on the floor that can make you trip. ?What can I do in the kitchen? ?Clean up any spills right away. ?Avoid walking on wet floors. ?Keep items that you use a lot in easy-to-reach places. ?If you need to reach something above you, use a strong step stool that has a grab bar. ?Keep electrical cords out of the way. ?Do not use floor polish or wax that makes floors slippery. If you must use wax, use non-skid floor wax. ?Do not have throw rugs and other things on the floor that can make you trip. ?What can I do with my stairs? ?Do not leave any items on the stairs. ?Make sure that there are handrails on both sides of the stairs and use them. Fix handrails that are broken or loose. Make sure that handrails are as long as the stairways. ?Check any carpeting to make sure that it is firmly attached to the stairs. Fix any carpet that is loose or worn. ?Avoid having throw rugs at the top or bottom of the stairs. If you do have throw rugs, attach them to the floor with carpet tape. ?Make sure that you have a light switch  at the top of the stairs and the bottom of the stairs. If you do not have them, ask someone to add them for you. ?What else can I do to help prevent falls? ?Wear shoes that: ?Do not have high heels. ?Have rubber bottoms. ?Are comfortable and fit you well. ?Are closed at the toe. Do not wear sandals. ?If you use a stepladder: ?Make sure that it is fully opened. Do not climb a closed stepladder. ?Make sure that both sides of the stepladder are locked into place. ?Ask someone to hold it for you, if possible. ?Clearly mark and make sure that you can see: ?Any grab bars or handrails. ?First and last steps. ?Where the edge of each step is. ?Use tools that help you move around (mobility aids)  if they are needed. These include: ?Canes. ?Walkers. ?Scooters. ?Crutches. ?Turn on the lights when you go into a dark area. Replace any light bulbs as soon as they burn out. ?Set up your furniture so you have

## 2021-07-15 DIAGNOSIS — K5730 Diverticulosis of large intestine without perforation or abscess without bleeding: Secondary | ICD-10-CM | POA: Diagnosis not present

## 2021-07-15 DIAGNOSIS — R197 Diarrhea, unspecified: Secondary | ICD-10-CM | POA: Diagnosis not present

## 2021-07-15 DIAGNOSIS — Z1211 Encounter for screening for malignant neoplasm of colon: Secondary | ICD-10-CM | POA: Diagnosis not present

## 2021-07-15 DIAGNOSIS — R194 Change in bowel habit: Secondary | ICD-10-CM | POA: Diagnosis not present

## 2021-07-15 DIAGNOSIS — K219 Gastro-esophageal reflux disease without esophagitis: Secondary | ICD-10-CM | POA: Diagnosis not present

## 2021-07-15 DIAGNOSIS — K51318 Ulcerative (chronic) rectosigmoiditis with other complication: Secondary | ICD-10-CM | POA: Diagnosis not present

## 2021-07-19 DIAGNOSIS — J449 Chronic obstructive pulmonary disease, unspecified: Secondary | ICD-10-CM | POA: Diagnosis not present

## 2021-07-21 ENCOUNTER — Telehealth: Payer: Self-pay | Admitting: *Deleted

## 2021-07-21 ENCOUNTER — Other Ambulatory Visit: Payer: Self-pay | Admitting: Gastroenterology

## 2021-07-21 NOTE — Telephone Encounter (Signed)
? ?  Pre-operative Risk Assessment  ?  ?Patient Name: Kathleen Hatfield  ?DOB: [birth year] ?MRN: [id number]  ? ?  ? ?Request for Surgical Clearance   ? ?Procedure:   COLONOSCOPY ? ?Date of Surgery:  Clearance 10/10/21                              ?   ?Surgeon:  DR. HUNG ?Surgeon's Group or Practice Name:  [facility] ?Phone number:  [phone] ?Fax number:  [phone] ?  ?Type of Clearance Requested:   ?- Medical  PT ON ASA 81 MG THOUGH THIS IS NOT BEING REQUESTED TO BE HELD ?  ?Type of Anesthesia:   PROPOFOL ?  ?Additional requests/questions:   ? ?Signed, ?Julaine Hua   ?07/21/2021, 1:23 PM  ? ?

## 2021-07-22 NOTE — Telephone Encounter (Signed)
? ?  Name: Kathleen Hatfield  ?DOB: [birth year]  ?MRN: [id number] ? ?Primary Cardiologist: [name], MD ? ?Chart reviewed as part of pre-operative protocol coverage. Because of [name]'s past medical history and time since last visit, she will require a follow-up in-office visit in order to better assess preoperative cardiovascular risk.  Last seen by Richardson Dopp, PA on 06/04/2021 for preoperative clearance for possible knee replacement surgery and was unable to achieve 4 METS.  Stress test was recommended for further risk restratification. ? ?Pre-op covering staff: ?- Please schedule appointment and call patient to inform them. If patient already had an upcoming appointment within acceptable timeframe, please add "pre-op clearance" to the appointment notes so provider is aware. ?- Please contact requesting surgeon's office via preferred method (i.e, phone, fax) to inform them of need for appointment prior to surgery. ? ?[name], NP  ?07/22/2021, 10:53 AM  ? ? ?

## 2021-07-22 NOTE — Telephone Encounter (Signed)
Pt agreeable to plan of care for IN OFFICE Appt for pre op clearance. Pt requested her appt to be with Dr. Burt Knack , though she has seen Richardson Dopp, [facility] in the past she said but wanted to see Dr. Burt Knack for pre op clearance. Pt said she also doers not want to cancel the 11/2021 appt with Dr. Burt Knack until talks to him further. Pt tells me that she prefers to have her procedure before she cancels the 11/2021 appt. I advised pt to be sure to d/w Dr. Burt Knack as to her wishes. Pt thanked me for the call and the help. I will update the requesting office the pt has appt 09/22/21 @ 9:40 with Dr. Burt Knack.  ? ? ?

## 2021-07-25 ENCOUNTER — Telehealth: Payer: Self-pay | Admitting: Pulmonary Disease

## 2021-07-25 NOTE — Telephone Encounter (Signed)
She is scheduled for colonoscopy using propofol with Dr. Carol Ada on October 10, 2021.  She has not been see in office since August 2022.  She needs an ROV for pre-procedure pulmonary assessment.

## 2021-07-25 NOTE — Telephone Encounter (Signed)
Called and scheduled patient for appt in July in [place] office. Nothing further needed.

## 2021-07-29 ENCOUNTER — Other Ambulatory Visit: Payer: Self-pay | Admitting: Physician Assistant

## 2021-07-29 ENCOUNTER — Other Ambulatory Visit: Payer: Self-pay | Admitting: Family Medicine

## 2021-07-30 ENCOUNTER — Telehealth: Payer: Self-pay

## 2021-07-30 NOTE — Chronic Care Management (AMB) (Signed)
Chronic Care Management Pharmacy Assistant   Name: Kathleen Hatfield  MRN: [id number] DOB: July 01, 1955   Reason for Encounter: Medication Adherence and Delivery Coordination     Recent office visits:  07/10/21-Julie Greer,LPN(fam.med) Telemedicine-[facility], discussed screenings,vaccines,diet,exercise, no medication changes  Recent consult visits:  None since last CCM contact   Hospital visits:  None in previous 6 months  Medications: Outpatient Encounter Medications as of 07/30/2021  Medication Sig   acetaminophen (TYLENOL) 500 MG tablet Take 500 mg by mouth every 6 (six) hours as needed for moderate pain.   albuterol (VENTOLIN HFA) 108 (90 Base) MCG/ACT inhaler Inhale 2 puffs into the lungs every 6 (six) hours as needed.   Alirocumab (PRALUENT) 150 MG/ML SOAJ Inject 1 pen. into the skin every 14 (fourteen) days.   aspirin EC 81 MG tablet Take 1 tablet (81 mg total) by mouth daily. Swallow whole.   BAYER CONTOUR NEXT TEST test strip 1 each by Other route daily. As directed   budesonide (PULMICORT) 0.5 MG/2ML nebulizer solution Take 2 mLs (0.5 mg total) by nebulization 2 (two) times daily.   buPROPion (WELLBUTRIN XL) 300 MG 24 hr tablet TAKE ONE TABLET BY MOUTH EVERY MORNING   Cholecalciferol (VITAMIN D-3) 5000 UNITS TABS Take 5,000 Units by mouth every other day.    colchicine 0.6 MG tablet TAKE 1 TABLET (0.6 MG TOTAL) BY MOUTH DAILY AS NEEDED (FOR GOUT).   dicyclomine (BENTYL) 20 MG tablet TAKE 1 TABLET UP TO FOUR TIMES A DAY FOR GI CRAMPING, PAIN, NAUSEA, AND VOMITING AS NEEDED   Dulaglutide (TRULICITY) 4.5 YT/0.3TW SOPN Inject 4.5 mg once weekly   DULoxetine (CYMBALTA) 60 MG capsule TAKE 1 CAPSULE BY MOUTH ONCE DAILY.   fluticasone (FLONASE) 50 MCG/ACT nasal spray Place 2 sprays into both nostrils daily.   furosemide (LASIX) 20 MG tablet TAKE THREE TABLETS BY MOUTH EVERY MORNING and TAKE THREE TABLETS BY MOUTH AT EVERY EVENING   gabapentin (NEURONTIN) 300 MG capsule Take 1 capsule (300 mg  total) by mouth at bedtime.   GLOBAL EASE INJECT PEN NEEDLES 32G X 4 MM MISC USE AS DIRECTED WITH VICTOZA AND TRESIBA.   insulin degludec (TRESIBA FLEXTOUCH) 200 UNIT/ML FlexTouch Pen Inject 120 Units into the skin daily.   ipratropium-albuterol (DUONEB) 0.5-2.5 (3) MG/3ML SOLN Take 3 mLs by nebulization every 6 (six) hours as needed (wheezing).   Iron, Ferrous Sulfate, 325 (65 Fe) MG TABS Take 325 mg by mouth every other day.   metolazone (ZAROXOLYN) 5 MG tablet Take 1 tablet (5 mg total) by mouth as needed.   MICROLET LANCETS MISC 1 each by Other route. As directed   nystatin (MYCOSTATIN/NYSTOP) powder Apply 1 application topically 3 (three) times daily.   ondansetron (ZOFRAN) 4 MG tablet TAKE ONE TABLET BY MOUTH every EIGHT hours AS NEEDED FOR NAUSEA AND VOMITING   oxyCODONE-acetaminophen (PERCOCET/ROXICET) 5-325 MG tablet Take 1 tablet by mouth every 6 (six) hours as needed for severe pain.   pantoprazole (PROTONIX) 40 MG tablet Take 1 tablet (40 mg total) by mouth daily.   polyethylene glycol (MIRALAX / GLYCOLAX) packet Take 17 g by mouth daily as needed for mild constipation.   potassium chloride (MICRO-K) 10 MEQ CR capsule TAKE 5 CAPSULES (50 MEQ TOTAL) BY MOUTH 2 (TWO) TIMES DAILY   promethazine-dextromethorphan (PROMETHAZINE-DM) 6.25-15 MG/5ML syrup Take 2.5 mLs by mouth 4 (four) times daily as needed for cough (sedation caution).   propranolol (INDERAL) 10 MG tablet TAKE 1 TABLET BY MOUTH 2 TIMES DAILY  revefenacin (YUPELRI) 175 MCG/3ML nebulizer solution Inhale one vial in nebulizer once daily. Do not mix with other nebulized medications.   rOPINIRole (REQUIP) 4 MG tablet TAKE 1 TABLET BY MOUTH TWICE DAILY   spironolactone (ALDACTONE) 25 MG tablet TAKE ONE TABLET BY MOUTH ONCE DAILY   traMADol (ULTRAM) 50 MG tablet Take 1 tablet (50 mg total) by mouth 3 (three) times daily as needed.   UNABLE TO FIND CPAP- At bedtime   No facility-administered encounter medications on file as of  07/30/2021.   BP Readings from Last 3 Encounters:  06/04/21 108/60  05/14/21 126/72  03/31/21 118/65    Lab Results  Component Value Date   HGBA1C 9.0 (A) 05/14/2021      Recent OV, Consult or Hospital visit:  No medication changes indicated   Last adherence delivery date:07/11/21      Patient is due for next adherence delivery on: 08/12/21  Spoke with patient on 07/31/21 reviewed medications and coordinated delivery.  This delivery to include: Adherence Packaging  30 Days  Packs: Bupropion HCL XL 343m- take 1 tablet at evening meal Cholecalciferol (vit D3)-5000units- take 1 tablet breakfast every other day  Ropinirole 464m take 1 tablet breakfast 1 tablet bedtime  Pantoprazole 4069mtake 1 tablet bedtime Spironolactone 29m44make 1 tablet breakfast Propanolol 10mg77mke 1 tablet breakfast 1 tablet evening meal  Furosemide 20mg 78m 3 tablets breakfast 3 tablet lunchtime Potassium chloride ER- take 4 tablets breakfast 2 tablets lunch 4 tablets evening meal Duloxetine 60mg- 22m 1 tablet breakfast Ferrous Sulfate 329mg- t52m1 tablet evening meal every other day  Gabapentin 300mg-tak50mtablet bedtime Cetirizine 10mg- tak31mtablet daily as needed- Aspirin 81mg -take59mablet at breakfast  VIAL medications: Ondansetron 4mg - take 64mablet every 8 hours as needed for nausea  Albuterol  108 mcg/act inhaler- 2 puffs into lungs every 6 hours as needed  Patient declined the following medications this month: Trulicity 4.5mg- gets 2.0NOCVS   once weekly in pm now  [place]                injecting qhs now    Any concerns about your medications? No  How often do you forget or accidentally miss a dose? Rarely  Is patient in packaging Yes  What is the date on your next pill pack?05/03/21  Any concerns or issues with your packaging?  The patient is asking for the potassium to be split 5 and 5 (lunch) instead of 6 and 4 if possible    Refills requested from  providers include: Bupropion, furosemide  Confirmed delivery date of 08/12/21, advised patient that pharmacy will contact them the morning of delivery.  Recent blood pressure readings are as follows:none available   Recent blood glucose readings are as follows:none available   Annual wellness visit in last year? Yes Most Recent BP reading:108/60 70-P  06/04/21  If Diabetic: Most recent A1C reading:9.0  05/14/21 Last eye exam / retinopathy screening:2021 Last diabetic foot exam:UTD  Lindsey FoltCharlene Brookeed  CCM 09/04/21  Farmer Mccahill HumbAvel Sensorh [phone]

## 2021-07-31 ENCOUNTER — Telehealth: Payer: Self-pay | Admitting: Cardiovascular Disease

## 2021-07-31 ENCOUNTER — Telehealth: Payer: Self-pay

## 2021-07-31 ENCOUNTER — Ambulatory Visit (INDEPENDENT_AMBULATORY_CARE_PROVIDER_SITE_OTHER): Payer: Medicare HMO

## 2021-07-31 DIAGNOSIS — I5032 Chronic diastolic (congestive) heart failure: Secondary | ICD-10-CM

## 2021-07-31 DIAGNOSIS — I10 Essential (primary) hypertension: Secondary | ICD-10-CM

## 2021-07-31 DIAGNOSIS — E1165 Type 2 diabetes mellitus with hyperglycemia: Secondary | ICD-10-CM

## 2021-07-31 DIAGNOSIS — J449 Chronic obstructive pulmonary disease, unspecified: Secondary | ICD-10-CM

## 2021-07-31 NOTE — Chronic Care Management (AMB) (Signed)
Contacted the patient for medication adherence and delivery coordination. The patient has concerns about the propanolol after speaking with pharmacist. I contacted Dr.Coopers office and left a message to contact the patient as she has concerns with the medication and possibly moving up her appointment currently in July.  [name], [facility]  CPP notified  [name], [place] Assistant [phone]

## 2021-07-31 NOTE — Telephone Encounter (Signed)
Per chart review - patient started on propranolol Aug 2021 per suggestion from neurology (Dr Tat). She was on metoprolol prior to this, and the change was suggested to see if propranolol would help with tremor.

## 2021-07-31 NOTE — Telephone Encounter (Signed)
Called and spoke to patient who states that she has a runny nose that "just pours constantly" and her mother who also took propranolol also had the same problem. Pt was started on this med by her PCP in 2021 as recommended by neurologist to see if it helped with tremors she was experiencing. Pt states it has taken her this long to realize that it's the propranolol causing the runny nose and due to her asthma and COPD, she doesn't want to be on a beta blocker. Pt also states that since taking the propranolol, she cannot tell it's made any difference in her tremors. Advised pt that she contact Dr Kennon Portela PCP that prescribes this for her-as we didn't prescribe it and she's not on it for a cardiology issue. She agreed and states the pharmacist is who told her to contact us. She will call their office tomorrow. No further questions.

## 2021-07-31 NOTE — Telephone Encounter (Signed)
Pt c/o medication issue:  1. Name of Medication: propranolol (INDERAL) 10 MG tablet  2. How are you currently taking this medication (dosage and times per day)? As prescribed   3. Are you having a reaction (difficulty breathing--STAT)?   4. What is your medication issue?  Velmina from [facility] called to get someone to call this patient back in regards to this medication. Pt was told by pharmacist and her insurance [employer] that this medicine may not be the right med and may need to be changed before her colonoscopy.  She says this medication was originally prescribed by her cardiologist.

## 2021-07-31 NOTE — Chronic Care Management (AMB) (Signed)
Chronic Care Management   CCM RN Visit Note  08/01/2021 Name: Kathleen Hatfield MRN: [id number] DOB: 28-May-1955  Subjective: Kathleen Hatfield is a 66 y.o. year old female who is a primary care patient of [name], MD. The care management team was consulted for assistance with disease management and care coordination needs.    Engaged with patient by telephone for follow up visit in response to provider referral for case management and/or care coordination services.   Consent to Services:  The patient was given information about Chronic Care Management services, agreed to services, and gave verbal consent prior to initiation of services.  Please see initial visit note for detailed documentation.   Patient agreed to services and verbal consent obtained.   Assessment: Review of patient past medical history, allergies, medications, health status, including review of consultants reports, laboratory and other test data, was performed as part of comprehensive evaluation and provision of chronic care management services.   SDOH (Social Determinants of Health) assessments and interventions performed:    CCM Care Plan  Allergies  Allergen Reactions   Almond Oil Anaphylaxis, Shortness Of Breath and Swelling   Morphine And Related Shortness Of Breath and Other (See Comments)    Pt. States while in the hospital it affected her breathing, O2 dropped to the 70's   Atorvastatin Other (See Comments)    Leg weakness   Ceclor [Cefaclor] Nausea And Vomiting   Sulfa Antibiotics Nausea And Vomiting   Ciprofloxacin Other (See Comments)    Makes joints and muscles ache   Levaquin [Levofloxacin] Other (See Comments)    Body aches   Losartan Other (See Comments)    Weakness    Statins Other (See Comments)    Leg and body weakness    Outpatient Encounter Medications as of 07/31/2021  Medication Sig   acetaminophen (TYLENOL) 500 MG tablet Take 500 mg by mouth every 6 (six) hours as needed for moderate  pain.   albuterol (VENTOLIN HFA) 108 (90 Base) MCG/ACT inhaler Inhale 2 puffs into the lungs every 6 (six) hours as needed.   Alirocumab (PRALUENT) 150 MG/ML SOAJ Inject 1 pen. into the skin every 14 (fourteen) days.   aspirin EC 81 MG tablet Take 1 tablet (81 mg total) by mouth daily. Swallow whole.   BAYER CONTOUR NEXT TEST test strip 1 each by Other route daily. As directed   budesonide (PULMICORT) 0.5 MG/2ML nebulizer solution Take 2 mLs (0.5 mg total) by nebulization 2 (two) times daily.   buPROPion (WELLBUTRIN XL) 300 MG 24 hr tablet TAKE ONE TABLET BY MOUTH EVERY MORNING   Cholecalciferol (VITAMIN D-3) 5000 UNITS TABS Take 5,000 Units by mouth every other day.    colchicine 0.6 MG tablet TAKE 1 TABLET (0.6 MG TOTAL) BY MOUTH DAILY AS NEEDED (FOR GOUT).   dicyclomine (BENTYL) 20 MG tablet TAKE 1 TABLET UP TO FOUR TIMES A DAY FOR GI CRAMPING, PAIN, NAUSEA, AND VOMITING AS NEEDED   Dulaglutide (TRULICITY) 4.5 NI/7.7OE SOPN Inject 4.5 mg once weekly   DULoxetine (CYMBALTA) 60 MG capsule TAKE 1 CAPSULE BY MOUTH ONCE DAILY.   fluticasone (FLONASE) 50 MCG/ACT nasal spray Place 2 sprays into both nostrils daily.   furosemide (LASIX) 20 MG tablet TAKE THREE TABLETS BY MOUTH EVERY MORNING and TAKE THREE TABLETS BY MOUTH AT EVERY EVENING   gabapentin (NEURONTIN) 300 MG capsule Take 1 capsule (300 mg total) by mouth at bedtime.   GLOBAL EASE INJECT PEN NEEDLES 32G X 4 MM MISC  USE AS DIRECTED WITH VICTOZA AND TRESIBA.   insulin degludec (TRESIBA FLEXTOUCH) 200 UNIT/ML FlexTouch Pen Inject 120 Units into the skin daily.   ipratropium-albuterol (DUONEB) 0.5-2.5 (3) MG/3ML SOLN Take 3 mLs by nebulization every 6 (six) hours as needed (wheezing).   Iron, Ferrous Sulfate, 325 (65 Fe) MG TABS Take 325 mg by mouth every other day.   metolazone (ZAROXOLYN) 5 MG tablet Take 1 tablet (5 mg total) by mouth as needed.   MICROLET LANCETS MISC 1 each by Other route. As directed   nystatin (MYCOSTATIN/NYSTOP) powder  Apply 1 application topically 3 (three) times daily.   ondansetron (ZOFRAN) 4 MG tablet TAKE ONE TABLET BY MOUTH every EIGHT hours AS NEEDED FOR NAUSEA AND VOMITING   oxyCODONE-acetaminophen (PERCOCET/ROXICET) 5-325 MG tablet Take 1 tablet by mouth every 6 (six) hours as needed for severe pain.   pantoprazole (PROTONIX) 40 MG tablet Take 1 tablet (40 mg total) by mouth daily.   polyethylene glycol (MIRALAX / GLYCOLAX) packet Take 17 g by mouth daily as needed for mild constipation.   potassium chloride (MICRO-K) 10 MEQ CR capsule TAKE 5 CAPSULES (50 MEQ TOTAL) BY MOUTH 2 (TWO) TIMES DAILY   promethazine-dextromethorphan (PROMETHAZINE-DM) 6.25-15 MG/5ML syrup Take 2.5 mLs by mouth 4 (four) times daily as needed for cough (sedation caution).   propranolol (INDERAL) 10 MG tablet TAKE 1 TABLET BY MOUTH 2 TIMES DAILY   revefenacin (YUPELRI) 175 MCG/3ML nebulizer solution Inhale one vial in nebulizer once daily. Do not mix with other nebulized medications.   rOPINIRole (REQUIP) 4 MG tablet TAKE 1 TABLET BY MOUTH TWICE DAILY   spironolactone (ALDACTONE) 25 MG tablet TAKE ONE TABLET BY MOUTH ONCE DAILY   traMADol (ULTRAM) 50 MG tablet Take 1 tablet (50 mg total) by mouth 3 (three) times daily as needed.   UNABLE TO FIND CPAP- At bedtime   No facility-administered encounter medications on file as of 07/31/2021.    Patient Active Problem List   Diagnosis Date Noted   Aortic atherosclerosis ([place]) 06/04/2021   Preoperative cardiovascular examination 06/04/2021   Hyperglycemia 02/14/2021   Rash 09/27/2020   COPD exacerbation ([place]) 09/21/2020   Acute exacerbation of COPD with asthma ([place]) 09/19/2020   Mixed diabetic hyperlipidemia associated with type 2 diabetes mellitus ([place]) 09/19/2020   Pain of foot 06/20/2020   Medicare annual wellness visit, initial 06/20/2020   Constipation 06/11/2020   Diverticulosis of colon 06/11/2020   Irritable bowel syndrome 38/12/1749   Periumbilical pain 02/58/5277    RLS (restless legs syndrome)    Asthma exacerbation 01/08/2020   Muscle weakness 01/08/2020   Grade I diastolic dysfunction 82/42/3536   Drug-induced myopathy 10/10/2019   Advance care planning 06/14/2019   RLQ abdominal pain 06/14/2019   COPD (chronic obstructive pulmonary disease) ([place]) 06/14/2019   Leukocytosis 06/14/2019   Hypercalcemia 06/14/2019   Gout 06/14/2019   Type 2 diabetes mellitus with hyperglycemia, without long-term current use of insulin (HCC)    COPD with acute exacerbation ([place]) 05/03/2017   Diabetes mellitus without complication (HCC)    CKD (chronic kidney disease) stage 3, GFR 30-59 ml/min (HCC)    Hypersomnia with sleep apnea 09/16/2015   Fatigue 06/07/2015   Morbid obesity ([place]) 06/07/2015   (HFpEF) heart failure with preserved ejection fraction (HCC)    Pneumonia of both lower lobes due to infectious organism 02/09/2013   OSA on CPAP 08/23/2012   Essential hypertension    Depression    GERD without esophagitis    Hyperlipidemia  Ulcerative colitis ([place])    Patellar tendinitis 05/25/2011   Knee pain 05/25/2011   Myofascial pain 05/25/2011   Cervicalgia 05/25/2011    Conditions to be addressed/monitored:CHF, COPD, and DMII  Care Plan : [facility] plan of care  Updates made by Dannielle Karvonen, RN since 08/01/2021 12:00 AM     Problem: chronic disease management, education and/ or care coordination   Priority: High     Long-Range Goal: Development of plan of care to address chronic disease management and / or care coordination needs.   Start Date: 02/07/2021  Expected End Date: 10/06/2021  Priority: High  Note:   Current Barriers:  Knowledge Deficits related to plan of care for management of COPD and DMII  Chronic Disease Management support and education needs related to COPD and DMII Patient states she had a follow up visit with practice pharmacist  on 07/04/21 to make arrangement for her medication packaging.   Patient states she has some shortness of  breath regarding her COPD.  She states she continues to use her Oxygen at hs and as needed during the day, her nebulizer and albuterol as needed.  Patient  states her blood sugars ranged from 130-170's.  Denies any blood sugars <70.  She state she continues to maintain a salt free/ sugar free/ gluten free diet approximately 50% of the time.  She reports checking her weight 2-3 times per week. She states her weight stays between 255-260 lbs.  Patient states her action plan is to take an extra metolazone if her weight is 3 lbs over night or 5 lbs in a week.     RNCM Clinical Goal(s):  Patient will verbalize basic understanding of COPD and DMII disease process and self health management plan as evidenced by patient report and/ or notation in chart.  take all medications exactly as prescribed and will call provider for medication related questions as evidenced by patient self report and/ or notation in chart    attend all scheduled medical appointments:   as evidenced by patient self report and/ or notation in chart.         continue to work with Consulting civil engineer and/or Social Worker to address care management and care coordination needs related to COPD and DMII as evidenced by adherence to CM Team Scheduled appointments     through collaboration with Consulting civil engineer, provider, and care team.   Interventions: 1:1 collaboration with primary care provider regarding development and update of comprehensive plan of care as evidenced by provider attestation and co-signature Inter-disciplinary care team collaboration (see longitudinal plan of care) Evaluation of current treatment plan related to  self management and patient's adherence to plan as established by provider  COPD Interventions:  (Status:  Goal on track:  Yes.) Long Term Goal Discussed COPD self care/management/and exacerbation prevention Advised patient to self assesses COPD action plan zone and make appointment with provider if in the yellow zone  for 48 hours without improvement Advised patient to call provider office to report ongoing symptoms, productive cough, general malaise  Reviewed medications and discussed importance of compliance.  Reviewed scheduled appointments Advised to continue to wear oxygen and use albuterol/ nebulizer as recommended by provider.   Diabetes Interventions:  (Status:  Goal on track:  Yes.) Long Term Goal Assessed patient's understanding of A1c goal: <7% Reviewed medications with patient and discussed importance of medication adherence Discussed plans with patient for ongoing care management follow up and provided patient with direct contact information for care  management team Reviewed scheduled/upcoming provider appointments Advised patient of importance of monitoring blood sugars daily Lab Results  Component Value Date   HGBA1C 9.0 (A) 05/14/2021   Heart Failure Interventions:  (Status:  Goal on track:  Yes.) Long Term Goal Reviewed Heart Failure Action Plan Discussed importance of daily weight and advised patient to weigh and record daily Discussed the importance of keeping all appointments with provider   Reviewed medications and discussed importance of compliance. Advised patient to follow heart failure action plan for weight gain as recommended by provider   Patient Goals/Self-Care Activities: Call pharmacy for medication refills 3-7 days in advance of running out of medications.  Contact practice pharmacist, Debbora Dus if you have questions/ concerns regarding your medications.   - Continue to take your medications as prescribed and rinse mouth out after use on inhalers - keep follow-up appointments with your providers -Identify and avoid symptom triggers, such as second hand smoke, virus, weather change, emotional upset, heavy exercise,  and environmental allergens that could exacerbate your COPD or asthma.  - Continue to wear your CPAP machine nightly and use your oxygen as advised by  your doctor.  -Schedule follow up appointment with your urologist - Check blood sugars daily.   Check blood sugar if you feel it is too high or too low and take your blood sugar readings or monitor to your doctor appointment.  - Plan to eat low carbohydrate and low salt meals, watch portion sizes and avoid sugar sweetened drinks.  - Weigh daily and record weights.  Follow heart failure action plan and advised by your provider.        Quinn Plowman RN,BSN,CCM RN Case Manager [place]  [phone]

## 2021-07-31 NOTE — Patient Instructions (Addendum)
Visit Information  Thank you for taking time to visit with me today. Please don't hesitate to contact me if I can be of assistance to you before our next scheduled telephone appointment.  Following are the goals we discussed today:  Call pharmacy for medication refills 3-7 days in advance of running out of medications.  Contact practice pharmacist, Debbora Dus if you have questions/ concerns regarding your medications.   - Continue to take your medications as prescribed and rinse mouth out after use on inhalers - keep follow-up appointments with your providers -Identify and avoid symptom triggers, such as second hand smoke, virus, weather change, emotional upset, heavy exercise,  and environmental allergens that could exacerbate your COPD or asthma.  - Continue to wear your CPAP machine nightly and use your oxygen as advised by your doctor.  -Schedule follow up appointment with your urologist - Check blood sugars daily.   Check blood sugar if you feel it is too high or too low and take your blood sugar readings or monitor to your doctor appointment.  - Plan to eat low carbohydrate and low salt meals, watch portion sizes and avoid sugar sweetened drinks.  - Weigh daily and record weights.  Follow heart failure action plan and advised by your provider.   Our next appointment is by telephone on 09/16/21 at 1:00 pm  Please call the care guide team at [phone] if you need to cancel or reschedule your appointment.   If you are experiencing a Mental Health or [place] or need someone to talk to, please call the Suicide and Crisis Lifeline: 988 call 1-800-273-TALK (toll free, 24 hour hotline)   Patient verbalizes understanding of instructions and care plan provided today and agrees to view in [place]. Active MyChart status and patient understanding of how to access instructions and care plan via MyChart confirmed with patient.     Quinn Plowman RN,BSN,CCM RN Case  Manager [place]  [phone]

## 2021-08-02 ENCOUNTER — Other Ambulatory Visit: Payer: Self-pay | Admitting: Family Medicine

## 2021-08-06 DIAGNOSIS — Z794 Long term (current) use of insulin: Secondary | ICD-10-CM | POA: Diagnosis not present

## 2021-08-06 DIAGNOSIS — I509 Heart failure, unspecified: Secondary | ICD-10-CM | POA: Diagnosis not present

## 2021-08-06 DIAGNOSIS — E1159 Type 2 diabetes mellitus with other circulatory complications: Secondary | ICD-10-CM | POA: Diagnosis not present

## 2021-08-06 DIAGNOSIS — J449 Chronic obstructive pulmonary disease, unspecified: Secondary | ICD-10-CM

## 2021-08-11 ENCOUNTER — Other Ambulatory Visit: Payer: Self-pay | Admitting: Family Medicine

## 2021-08-11 DIAGNOSIS — Z780 Asymptomatic menopausal state: Secondary | ICD-10-CM

## 2021-08-12 ENCOUNTER — Ambulatory Visit
Admission: RE | Admit: 2021-08-12 | Discharge: 2021-08-12 | Disposition: A | Discharge status: Home / Self Care | Payer: Medicare HMO | Source: Ambulatory Visit | Attending: Family Medicine | Admitting: Family Medicine

## 2021-08-12 DIAGNOSIS — Z780 Asymptomatic menopausal state: Secondary | ICD-10-CM | POA: Diagnosis not present

## 2021-08-12 DIAGNOSIS — Z1231 Encounter for screening mammogram for malignant neoplasm of breast: Secondary | ICD-10-CM | POA: Diagnosis not present

## 2021-08-19 DIAGNOSIS — J449 Chronic obstructive pulmonary disease, unspecified: Secondary | ICD-10-CM | POA: Diagnosis not present

## 2021-08-27 ENCOUNTER — Encounter: Payer: Self-pay | Admitting: Pulmonary Disease

## 2021-08-27 ENCOUNTER — Ambulatory Visit: Payer: Medicare HMO | Admitting: Pulmonary Disease

## 2021-08-27 ENCOUNTER — Telehealth: Payer: Self-pay | Admitting: Family Medicine

## 2021-08-27 VITALS — BP 118/70 | HR 66 | Temp 97.6°F | Ht 65.0 in | Wt 260.6 lb

## 2021-08-27 DIAGNOSIS — J449 Chronic obstructive pulmonary disease, unspecified: Secondary | ICD-10-CM

## 2021-08-27 DIAGNOSIS — Z01811 Encounter for preprocedural respiratory examination: Secondary | ICD-10-CM | POA: Diagnosis not present

## 2021-08-27 DIAGNOSIS — Z9989 Dependence on other enabling machines and devices: Secondary | ICD-10-CM | POA: Diagnosis not present

## 2021-08-27 DIAGNOSIS — G4733 Obstructive sleep apnea (adult) (pediatric): Secondary | ICD-10-CM

## 2021-08-27 MED ORDER — FORMOTEROL FUMARATE 20 MCG/2ML IN NEBU: 20.0000 ug | INHALATION_SOLUTION | Freq: Two times a day (BID) | RESPIRATORY_TRACT | 5 refills | Status: DC

## 2021-08-27 NOTE — Telephone Encounter (Signed)
Patient notified as instructed by telephone and verbalized understanding. Patient stated that she does not think that the Propranolol has helped with her tremors. Patient stated that she still has the tremors. Patient stated that she does feel that the medication is causing her nose to run and the same thing happened when her mother took it. Patient stated that she is on oxygen 24/7 at 2 liters and her nose running constantly does not help.Patient stated that she was put on the oxygen about 3 months ago.

## 2021-08-27 NOTE — Telephone Encounter (Signed)
Spoke to [employer] at [place]. Thurmond Butts stated that they work with [employer] regarding medications. Thurmond Butts stated that they show that since patient has COPD she should be changed to a different medication such as Atenolol, Metoprolol or Bisoprolol. Thurmond Butts stated this change is recommended because of her lung problems. Thurmond Butts stated that they are not the patient's pharmacy and if the doctor agrees to the change notify the patient's pharmacy to make the change.

## 2021-08-27 NOTE — Progress Notes (Signed)
[facility]  Chief Complaint  Patient presents with   Follow-up    Patient states that she does not sleep much. Feels like her breathing may be a little worse since last visit. Does feel like [name] is maybe helping a little but does not notice a big difference.     Constitutional:  BP 118/70 (BP Location: Right Arm, Patient Position: Sitting, Cuff Size: Large)   Pulse 66   Temp 97.6 F (36.4 C) (Oral)   Ht 5' 5"  (1.651 m)   Wt 260 lb 9.6 oz (118.2 kg)   SpO2 93%   BMI 43.37 kg/m   Past Medical History:  Ulcerative colitis, Pneumonia, Neuropathy, HTN, HLD, GERD, Diverticulitis, DM, Depression, CKD 3a, Diastolic CHF, OA, Anemia  Past Surgical History:  She  has a past surgical history that includes Abdominal hysterectomy; Cholecystectomy; Cesarean section; Appendectomy; Sigmoidectomy (2010); right heart catheterization (N/A, 02/27/2013); right heart catheterization (N/A, 12/14/2013); Cardiac catheterization; Breast surgery; Hernia repair; Ventral hernia repair (N/A, 11/15/2015); Laparoscopic lysis of adhesions (N/A, 11/15/2015); Insertion of mesh (N/A, 11/15/2015); and Breast biopsy (Left).  Brief Summary:  Kathleen Hatfield is a 66 y.o. female with COPD/asthma and obstructive sleep apnea.      Subjective:   She has colonoscopy scheduled in hospital for August.  She gets chest tightness.  Feels better after using her nebulizer and CPAP.  She doesn't have perforomist.  Has occasional cough.  Using CPAP nightly.  Pressure setting is comfortable.  Mask shifts sometimes, but not too bad.  She walked 1 lap in office today on room air.  Maintained SpO2, but had to stop early due to feeling short of breath.  Her husband reports that she gets funny leg twitches while asleep.  She doesn't feel these, but has noticed more hand tremor with activities.  Physical Exam:   Appearance - well kempt, in wheelchair  ENMT - no sinus tenderness, no  oral exudate, no LAN, Mallampati 3 airway, no stridor  Respiratory - equal breath sounds bilaterally, no wheezing or rales  CV - s1s2 regular rate and rhythm, no murmurs  Ext - no clubbing, no edema  Skin - no rashes  Psych - normal mood and affect    Pulmonary testing:  PFT 07/02/15 >> FEV1 1.50 (56%), FEV1% 69, TLC 4.99 (96%), DLCO 60% PFT 07/24/19 >> FEV1 1.09 (42%), FEV1% 67, TLC 4.85 (93%), DLCO 87%, +BD  Chest Imaging:  V/Q 04/05/16 >> normal  Sleep Tests:  PSG 09/01/12 >> AHI 36.1 ONO with CPAP 03/14/17 >> test time 10 hrs 34 min.  Basal SpO2 93%, low SpO2 88%. CPAP 07/27/21 to 08/25/21 >> used on 30 of 30 nights with average 5 hrs 46 min.  Average AHI 0.2 with CPAP 13 cm H2O  Cardiac Tests:  Echo 05/18/19 >> EF 70 %, mild LVH, grade 1 DD, aortic root 39 mm  Social History:  She  reports that she has never smoked. She has never used smokeless tobacco. She reports that she does not drink alcohol and does not use drugs.  Family History:  Her family history includes COPD in her father; Dementia in her mother; Diabetes in her maternal grandmother; Heart disease in her brother, father, and mother; Hypertension in her father and mother; Other in her brother and brother.     Assessment/Plan:   COPD with asthma. - she has difficulty using inhalers - restart perforomist - continue yupelri and pulmicort - prn albuterol   Obstructive sleep  apnea. - she reports compliance with CPAP and benefit from therapy - uses [employer] for her DME - continue CPAP 13 cm H2O   Chronic diastolic CHF. - followed by Dr. [name] with [facility]   Obesity with deconditioning. - encouraged her to maintain a regular exercise regimen  Tremor, Restless leg syndrome. - previously seen by Dr. Wells Guiles Tat with [facility]; advised her to contact neurology if her tremor progress  Colonoscopy. - she can proceed with this in hospital setting - she need close monitoring of her oxygen  in the peri-procedure period - pulmonary service can be available as needed in the hospital after her procedure  Time Spent Involved in Patient Care on Day of Examination:  48 minutes  Follow up:   Patient Instructions  Use perforomist with pulmicort in your nebulizer in the morning and the evening  Use yupelri in your nebulizer daily  Follow up in 4 months  Medication List:   Allergies as of 08/27/2021       Reactions   Almond Oil Anaphylaxis, Shortness Of Breath, Swelling   Morphine And Related Shortness Of Breath, Other (See Comments)   Pt. States while in the hospital it affected her breathing, O2 dropped to the 70's   Atorvastatin Other (See Comments)   Leg weakness   Ceclor [cefaclor] Nausea And Vomiting   Sulfa Antibiotics Nausea And Vomiting   Ciprofloxacin Other (See Comments)   Makes joints and muscles ache   Levaquin [levofloxacin] Other (See Comments)   Body aches   Losartan Other (See Comments)   Weakness   Statins Other (See Comments)   Leg and body weakness        Medication List        Accurate as of August 27, 2021  5:05 PM. If you have any questions, ask your nurse or doctor.          STOP taking these medications    propranolol 10 MG tablet Commonly known as: INDERAL Stopped by: [name], MD       TAKE these medications    acetaminophen 500 MG tablet Commonly known as: TYLENOL Take 500 mg by mouth every 6 (six) hours as needed for moderate pain.   albuterol 108 (90 Base) MCG/ACT inhaler Commonly known as: VENTOLIN HFA Inhale 2 puffs into the lungs every 6 (six) hours as needed.   aspirin EC 81 MG tablet Take 1 tablet (81 mg total) by mouth daily. Swallow whole.   Bayer Contour Next Test test strip Generic drug: glucose blood 1 each by Other route daily. As directed   budesonide 0.5 MG/2ML nebulizer solution Commonly known as: PULMICORT Take 2 mLs (0.5 mg total) by nebulization 2 (two) times daily.   buPROPion 300 MG  24 hr tablet Commonly known as: WELLBUTRIN XL TAKE ONE TABLET BY MOUTH EVERY MORNING   colchicine 0.6 MG tablet TAKE 1 TABLET (0.6 MG TOTAL) BY MOUTH DAILY AS NEEDED (FOR GOUT).   dicyclomine 20 MG tablet Commonly known as: BENTYL TAKE 1 TABLET UP TO FOUR TIMES A DAY FOR GI CRAMPING, PAIN, NAUSEA, AND VOMITING AS NEEDED   DULoxetine 60 MG capsule Commonly known as: CYMBALTA TAKE 1 CAPSULE BY MOUTH ONCE DAILY.   fluticasone 50 MCG/ACT nasal spray Commonly known as: FLONASE Place 2 sprays into both nostrils daily.   formoterol 20 MCG/2ML nebulizer solution Commonly known as: Perforomist Take 2 mLs (20 mcg total) by nebulization 2 (two) times daily. Started by: [name], MD  furosemide 20 MG tablet Commonly known as: LASIX TAKE THREE TABLETS BY MOUTH EVERY MORNING and TAKE THREE TABLETS BY MOUTH AT EVERY EVENING   gabapentin 300 MG capsule Commonly known as: NEURONTIN Take 1 capsule (300 mg total) by mouth at bedtime.   Global Ease Inject Pen Needles 32G X 4 MM Misc Generic drug: Insulin Pen Needle USE AS DIRECTED WITH VICTOZA AND TRESIBA.   ipratropium-albuterol 0.5-2.5 (3) MG/3ML Soln Commonly known as: DUONEB Take 3 mLs by nebulization every 6 (six) hours as needed (wheezing).   Iron (Ferrous Sulfate) 325 (65 Fe) MG Tabs Take 325 mg by mouth every other day.   metolazone 5 MG tablet Commonly known as: ZAROXOLYN TAKE 1 TABLET BY MOUTH AS NEEDED.   Microlet Lancets Misc 1 each by Other route. As directed   nystatin powder Commonly known as: MYCOSTATIN/NYSTOP Apply 1 application topically 3 (three) times daily.   ondansetron 4 MG tablet Commonly known as: ZOFRAN TAKE ONE TABLET BY MOUTH every EIGHT hours AS NEEDED FOR NAUSEA AND VOMITING   oxyCODONE-acetaminophen 5-325 MG tablet Commonly known as: PERCOCET/ROXICET Take 1 tablet by mouth every 6 (six) hours as needed for severe pain.   pantoprazole 40 MG tablet Commonly known as: PROTONIX Take 1 tablet  (40 mg total) by mouth daily.   polyethylene glycol 17 g packet Commonly known as: MIRALAX / GLYCOLAX Take 17 g by mouth daily as needed for mild constipation.   potassium chloride 10 MEQ CR capsule Commonly known as: MICRO-K TAKE 5 CAPSULES (50 MEQ TOTAL) BY MOUTH 2 (TWO) TIMES DAILY   Praluent 150 MG/ML Soaj Generic drug: Alirocumab Inject 1 pen. into the skin every 14 (fourteen) days.   promethazine-dextromethorphan 6.25-15 MG/5ML syrup Commonly known as: PROMETHAZINE-DM Take 2.5 mLs by mouth 4 (four) times daily as needed for cough (sedation caution).   rOPINIRole 4 MG tablet Commonly known as: REQUIP TAKE 1 TABLET BY MOUTH TWICE DAILY   spironolactone 25 MG tablet Commonly known as: ALDACTONE TAKE ONE TABLET BY MOUTH ONCE DAILY   traMADol 50 MG tablet Commonly known as: ULTRAM Take 1 tablet (50 mg total) by mouth 3 (three) times daily as needed.   Tyler Aas FlexTouch 200 UNIT/ML FlexTouch Pen Generic drug: insulin degludec Inject 120 Units into the skin daily.   Trulicity 4.5 WT/8.8EK Sopn Generic drug: Dulaglutide Inject 4.5 mg once weekly   UNABLE TO FIND CPAP- At bedtime   Vitamin D-3 125 MCG (5000 UT) Tabs Take 5,000 Units by mouth every other day.   Yupelri 175 MCG/3ML nebulizer solution Generic drug: revefenacin Inhale one vial in nebulizer once daily. Do not mix with other nebulized medications.        Signature:  [name], MD [place] Pager - [phone] 08/27/2021, 5:05 PM

## 2021-08-27 NOTE — Patient Instructions (Signed)
Use perforomist with pulmicort in your nebulizer in the morning and the evening  Use yupelri in your nebulizer daily  Follow up in 4 months

## 2021-08-27 NOTE — Telephone Encounter (Signed)
Please check with patient.  If she tolerating propranolol?  Did it help with tremor?  I now see a note in the chart from 07/31/2021 that was not routed to me.  I do not recall seeing this message before.  If she having runny nose attributable to propranolol?  It might make sense to stop the propranolol totally and see how she does but let me know about the above first.  Thanks.

## 2021-08-27 NOTE — Telephone Encounter (Signed)
Ryan from [facility] called regarding this patient, he stated that she needs to be switched to another drug. She is currently on propranolol (INDERAL) 10 MG tablet. Please advise when possible.  Callback Number: [phone]

## 2021-08-27 NOTE — Telephone Encounter (Signed)
In that case, I would stop propranolol for about 10 days and see if she notes any changes.  I took it off her med list.  Thanks.

## 2021-08-28 ENCOUNTER — Telehealth: Payer: Self-pay | Admitting: Family Medicine

## 2021-08-28 NOTE — Telephone Encounter (Signed)
Ms. Shifflett notified as instructed by telephone.  Patient states understanding.

## 2021-08-28 NOTE — Telephone Encounter (Signed)
Called patient and left a voicemail to call the office back to schedule her CPE with [name]. Please advise, thanks.  Callback Number: [phone]

## 2021-08-28 NOTE — Telephone Encounter (Signed)
Please schedule CPE with Dr. Para March.  Already seen Nurse for [facility].

## 2021-08-29 ENCOUNTER — Telehealth: Payer: Self-pay

## 2021-09-01 ENCOUNTER — Telehealth: Payer: Self-pay

## 2021-09-02 ENCOUNTER — Other Ambulatory Visit: Payer: Medicare HMO

## 2021-09-02 DIAGNOSIS — E782 Mixed hyperlipidemia: Secondary | ICD-10-CM | POA: Diagnosis not present

## 2021-09-02 LAB — HEPATIC FUNCTION PANEL
ALT: 27 IU/L (ref 0–32)
AST: 23 IU/L (ref 0–40)
Albumin: 4.6 g/dL (ref 3.8–4.8)
Alkaline Phosphatase: 113 IU/L (ref 44–121)
Bilirubin Total: 0.5 mg/dL (ref 0.0–1.2)
Bilirubin, Direct: 0.15 mg/dL (ref 0.00–0.40)
Total Protein: 7.3 g/dL (ref 6.0–8.5)

## 2021-09-02 LAB — LIPID PANEL
Chol/HDL Ratio: 4.7 ratio — ABNORMAL HIGH (ref 0.0–4.4)
Cholesterol, Total: 202 mg/dL — ABNORMAL HIGH (ref 100–199)
HDL: 43 mg/dL (ref >39)
LDL Chol Calc (NIH): 106 mg/dL — ABNORMAL HIGH (ref 0–99)
Triglycerides: 312 mg/dL — ABNORMAL HIGH (ref 0–149)
VLDL Cholesterol Cal: 53 mg/dL — ABNORMAL HIGH (ref 5–40)

## 2021-09-03 ENCOUNTER — Telehealth: Payer: Self-pay | Admitting: Pharmacist

## 2021-09-03 DIAGNOSIS — E782 Mixed hyperlipidemia: Secondary | ICD-10-CM

## 2021-09-03 NOTE — Telephone Encounter (Signed)
Called pt to discuss lipid panel results after starting Praluent. LDL with notable improvement, still above goal < 70 though as her baseline LDL is very elevated > 200. Previously intolerant to multiple statins. Discussed adding on Zetia and fenofibrate as TG remain elevated > 300. She does have DM with elevated A1c (ranging 9-11) which is contributing to elevated TG as well. She does not wish to add on either medication at this time. Wishes to continue on current meds and recheck labs in another 6 weeks. Encouraged heart healthy diet and increase in physical activity in the mean time.

## 2021-09-04 ENCOUNTER — Ambulatory Visit (INDEPENDENT_AMBULATORY_CARE_PROVIDER_SITE_OTHER): Payer: Medicare HMO | Admitting: Pharmacist

## 2021-09-04 DIAGNOSIS — R350 Frequency of micturition: Secondary | ICD-10-CM | POA: Diagnosis not present

## 2021-09-04 DIAGNOSIS — N390 Urinary tract infection, site not specified: Secondary | ICD-10-CM | POA: Diagnosis not present

## 2021-09-04 DIAGNOSIS — J449 Chronic obstructive pulmonary disease, unspecified: Secondary | ICD-10-CM

## 2021-09-04 DIAGNOSIS — I5032 Chronic diastolic (congestive) heart failure: Secondary | ICD-10-CM

## 2021-09-04 DIAGNOSIS — N393 Stress incontinence (female) (male): Secondary | ICD-10-CM | POA: Diagnosis not present

## 2021-09-04 DIAGNOSIS — I10 Essential (primary) hypertension: Secondary | ICD-10-CM

## 2021-09-04 DIAGNOSIS — E1165 Type 2 diabetes mellitus with hyperglycemia: Secondary | ICD-10-CM

## 2021-09-04 NOTE — Progress Notes (Signed)
1  Chronic Care Management Pharmacy Note  04/07/2021 Name:  Kathleen Hatfield MRN:  [id number] DOB:  [birth year]  Summary: CCM F/U visit -Pt endorses medication adherence  -HLD: LDL improved from 208 to 106 on Praluent -DM: A1c 9.0% (05/2021) improved from 10.9% (02/2021); she reports compliance with Trulicity and insulin now that she takes them at night; reviewed benefits of CGM and pt would like to try Dexcom  Recommendations:  -Start Dexcom G7 - ordered to [facility] DME; pt will call PharmD to set up training when she receives it  Plan: -[place] will call patient monthly for medication coordination -Pharmacist follow up televisit scheduled for 2 months    Subjective: Kathleen Hatfield is an 66 y.o. year old female who is a primary patient of [name], [name], MD.  The CCM team was consulted for assistance with disease management and care coordination needs.    Engaged with patient by telephone for follow up visit in response to provider referral for pharmacy case management and/or care coordination services.   Consent to Services:  The patient was given information about Chronic Care Management services, agreed to services, and gave verbal consent prior to initiation of services.  Please see initial visit note for detailed documentation.   Patient Care Team: [name], MD as PCP - General (Family Medicine) [name], MD as PCP - Cardiology (Cardiology) [name], MD as Consulting Physician (Pulmonary Disease) [name], MD as Consulting Physician (General Surgery) Galax, [name], MD (Inactive) as Consulting Physician (Pulmonary Disease) Dannielle Karvonen, RN as Case Manager [name], [facility] as Pharmacist (Pharmacist) [name] as Physician Assistant (Cardiology)  Recent office visits:  03/11/21 - [name], MD, PCP - Pt presented for diabetes follow up. Increase insulin by 2 units/day for BP > 150. If you have more ankle  swelling take 1 dose of metolazone.  Update me about your breathing and sugar next week.  02/27/21 - [name], MD, PCP - Pt presented for hospital follow up. Marland Kitchen  She was admitted with flu with hyperglycemia exacerbated by steroid use.  Improved in the meantime.  Discussed taking 1 dose of metolazone to see if that helped with her breathing and cough in the meantime.  She can use Tessalon for cough. 02/07/21 - Alma Friendly, NP - Pt presented for COPD exacerbation. Start doxycycline x 10 days and prednisone 40 mg x 5 days. Adjust insulin by 10 units for BG > 200.   11/28/20 - [name], MD, PCP - Pt presented for diabetes follow up and CHF. Stop meloxicam. Updated labs. A1c elevated, Referral to endocrinology. BNP normal, hold metolazone. Low iron, start supplementation.    Recent consult visits:  09/03/21 Cardiology TE - discuss lipid results on Praluent. Not at goal yet, pt declines additional med. Recheck lipids 6 weeks.  08/27/21-[name],MD (Pulmoanry): f/u sleep apnea-restart perforomist-Use yupelri in your nebulizer daily -previously seen by Dr. Wells Guiles Tat with [facility]; advised her to contact neurology if her tremor progressf/u 4 months  07/02/21 [place] PharmD (Lipid clinic): LDL 208. Start Praluent 150 mg q14d.  06/04/21-[name],PA(Cardiology)-F/U CHF-Recent creatinine stable. Arrange fasting Lipids(abnormal)  Refer to PharmD [facility]. Start ASA 81 mg once daily.  05/14/21 Dr [name] (Endocrine):  Establish care DM. Advised mealtime insulin, pt declined. Increased Tresiba to 120 units.   Hospital visits: 03/31/21 - ED visit, atypical chest pain 02/14/21 - 02/18/21 - Admission, COPD exacerbation  02/08/21 -  ED, COPD exacerbation 12/12/20 - ED visit, acute gout  Objective:  Lab Results  Component Value Date   CREATININE 1.24 (H) 03/31/2021   BUN 20 03/31/2021   GFR 48.76 (L) 02/27/2021   GFRNONAA 48 (L) 03/31/2021   GFRAA 69 01/19/2020   NA 137  03/31/2021   K 3.8 03/31/2021   CALCIUM 10.0 03/31/2021   CO2 34 (H) 03/31/2021   GLUCOSE 210 (H) 03/31/2021    Lab Results  Component Value Date/Time   HGBA1C 9.0 (A) 05/14/2021 03:10 PM   HGBA1C 10.9 (H) 02/14/2021 06:59 AM   HGBA1C 9.4 (H) 11/28/2020 03:56 PM   GFR 48.76 (L) 02/27/2021 12:16 PM   GFR 38.07 (L) 11/28/2020 03:56 PM    Last diabetic Eye exam:  Lab Results  Component Value Date/Time   HMDIABEYEEXA No Retinopathy 12/22/2019 12:00 AM    Last diabetic Foot exam: completed by PCP 11/28/2020 - normal    Lab Results  Component Value Date   CHOL 202 (H) 09/02/2021   HDL 43 09/02/2021   LDLCALC 106 (H) 09/02/2021   TRIG 312 (H) 09/02/2021   CHOLHDL 4.7 (H) 09/02/2021       Latest Ref Rng & Units 09/02/2021    8:50 AM 03/31/2021    8:03 PM 11/28/2020    3:56 PM  Hepatic Function  Total Protein 6.0 - 8.5 g/dL 7.3  7.3  7.0   Albumin 3.8 - 4.8 g/dL 4.6  3.5  3.8   AST 0 - 40 IU/L _0 ALT 0 - 32 IU/L _1 Alk Phosphatase 44 - 121 IU/L 113  88  92   Total Bilirubin 0.0 - 1.2 mg/dL 0.5  1.4  0.3   Bilirubin, Direct 0.00 - 0.40 mg/dL 0.15  0.4      Lab Results  Component Value Date/Time   TSH 2.850 06/09/2019 12:50 PM   TSH 2.31 04/15/2015 03:45 PM       Latest Ref Rng & Units 03/31/2021    6:06 PM 02/27/2021   12:16 PM 02/16/2021   12:40 AM  CBC  WBC 4.0 - 10.5 K/uL 9.7  10.6  16.1   Hemoglobin 12.0 - 15.0 g/dL 13.6  13.6  13.7   Hematocrit 36.0 - 46.0 % 41.6  41.9  42.3   Platelets 150 - 400 K/uL 328  360.0  259     Lab Results  Component Value Date/Time   VD25OH 45.52 06/18/2020 04:52 PM   VD25OH 47.60 06/22/2019 01:59 PM    Clinical ASCVD: No  The 10-year ASCVD risk score (Arnett DK, et al., 2019) is: 14.5%   Values used to calculate the score:     Age: 66 years     Sex: Female     Is Non-Hispanic African American: No     Diabetic: Yes     Tobacco smoker: No     Systolic Blood Pressure: 154 mmHg     Is BP treated: Yes      HDL Cholesterol: 43 mg/dL     Total Cholesterol: 202 mg/dL       07/10/2021   11:14 AM 08/12/2020    1:54 PM 12/28/2017    2:09 PM  Depression screen PHQ 2/9  Decreased Interest 0 3 1  Down, Depressed, Hopeless _2 PHQ - 2 Score _3 Altered sleeping _4 Tired, decreased energy _5 Change in appetite  0 2 0  Feeling bad or failure about yourself  0 3 3  Trouble concentrating 0 3 0  Moving slowly or fidgety/restless 0 3 3  Suicidal thoughts 0 0 0  PHQ-9 Score _0 Difficult doing work/chores Not difficult at all Somewhat difficult Very difficult    Social History   Tobacco Use  Smoking Status Never  Smokeless Tobacco Never   BP Readings from Last 3 Encounters:  08/27/21 118/70  06/04/21 108/60  05/14/21 126/72   Pulse Readings from Last 3 Encounters:  08/27/21 66  06/04/21 70  05/14/21 84   Wt Readings from Last 3 Encounters:  08/27/21 260 lb 9.6 oz (118.2 kg)  06/04/21 256 lb 3.2 oz (116.2 kg)  05/14/21 255 lb 6.4 oz (115.8 kg)   BMI Readings from Last 3 Encounters:  08/27/21 43.37 kg/m  06/04/21 42.63 kg/m  05/14/21 42.50 kg/m   Iron/TIBC/Ferritin/ %Sat    Component Value Date/Time   IRON 37 (L) 11/28/2020 1556   IRON 66 09/19/2019 0000   TIBC 430 09/19/2019 0000   FERRITIN 64.4 11/28/2020 1556   FERRITIN 50 09/19/2019 0000   IRONPCTSAT 15 09/19/2019 0000    Assessment/Interventions: Review of patient past medical history, allergies, medications, health status, including review of consultants reports, laboratory and other test data, was performed as part of comprehensive evaluation and provision of chronic care management services.   SDOH:  (Social Determinants of Health) assessments and interventions performed: Yes  SDOH Screenings   Alcohol Screen: Low Risk  (07/10/2021)   Alcohol Screen    Last Alcohol Screening Score (AUDIT): 0  Depression (PHQ2-9): Low Risk  (07/10/2021)   Depression (PHQ2-9)    PHQ-2 Score: 4  Financial Resource  Strain: Low Risk  (07/10/2021)   Overall Financial Resource Strain (CARDIA)    Difficulty of Paying Living Expenses: Not hard at all  Food Insecurity: No Food Insecurity (07/10/2021)   Hunger Vital Sign    Worried About Running Out of Food in the Last Year: Never true    Ran Out of Food in the Last Year: Never true  Housing: Low Risk  (07/10/2021)   Housing    Last Housing Risk Score: 0  Physical Activity: Inactive (07/10/2021)   Exercise Vital Sign    Days of Exercise per Week: 0 days    Minutes of Exercise per Session: 0 min  Social Connections: Moderately Isolated (07/10/2021)   Social Connection and Isolation Panel [NHANES]    Frequency of Communication with Friends and Family: More than three times a week    Frequency of Social Gatherings with Friends and Family: Once a week    Attends Religious Services: Never    Marine scientist or Organizations: No    Attends Archivist Meetings: Never    Marital Status: Living with partner  Stress: No Stress Concern Present (07/10/2021)   [place]    Feeling of Stress : Not at all  Tobacco Use: Low Risk  (08/27/2021)   Patient History    Smoking Tobacco Use: Never    Smokeless Tobacco Use: Never    Passive Exposure: Not on file  Transportation Needs: No Transportation Needs (07/10/2021)   PRAPARE - Transportation    Lack of Transportation (Medical): No    Lack of Transportation (Non-Medical): No    CCM Care Plan  Allergies  Allergen Reactions   Almond Oil Anaphylaxis, Shortness Of Breath and Swelling   Morphine  And Related Shortness Of Breath and Other (See Comments)    Pt. States while in the hospital it affected her breathing, O2 dropped to the 70's   Atorvastatin Other (See Comments)    Leg weakness   Ceclor [Cefaclor] Nausea And Vomiting   Sulfa Antibiotics Nausea And Vomiting   Ciprofloxacin Other (See Comments)    Makes joints and muscles ache    Levaquin [Levofloxacin] Other (See Comments)    Body aches   Losartan Other (See Comments)    Weakness    Statins Other (See Comments)    Leg and body weakness    Medications Reviewed Today     Reviewed by [name], [facility] (Pharmacist) on 09/04/21 at [place] List Status: <None>   Medication Order Taking? Sig Documenting Provider Last Dose Status Informant  acetaminophen (TYLENOL) 500 MG tablet [id number] Yes Take 500 mg by mouth every 6 (six) hours as needed for moderate pain. [provider] Taking Active Self  albuterol (VENTOLIN HFA) 108 (90 Base) MCG/ACT inhaler [id number] Yes Inhale 2 puffs into the lungs every 6 (six) hours as needed. [name], MD Taking Active   Alirocumab (PRALUENT) 150 MG/ML Darden Palmer [id number] Yes Inject 1 pen. into the skin every 14 (fourteen) days. Supple, Harlon Flor, RPH-CPP Taking Active   aspirin EC 81 MG tablet [id number] Yes Take 1 tablet (81 mg total) by mouth daily. Swallow whole. Richardson Dopp T, PA-C Taking Active   BAYER CONTOUR NEXT TEST test strip [id number] Yes 1 each by Other route daily. As directed [provider] Taking Active Self           Med Note Tamala Julian, JEFFREY W   Mon May 03, 2017 11:02 PM)    budesonide (PULMICORT) 0.5 MG/2ML nebulizer solution [id number] Yes Take 2 mLs (0.5 mg total) by nebulization 2 (two) times daily. [name], MD Taking Active Self  buPROPion (WELLBUTRIN XL) 300 MG 24 hr tablet [id number] Yes TAKE ONE TABLET BY MOUTH EVERY MORNING [name], MD Taking Active   Cholecalciferol (VITAMIN D-3) 5000 UNITS TABS [id number] Yes Take 5,000 Units by mouth every other day.  [provider] Taking Active Self  colchicine 0.6 MG tablet [id number] Yes TAKE 1 TABLET (0.6 MG TOTAL) BY MOUTH DAILY AS NEEDED (FOR GOUT). [name], MD Taking Active   dicyclomine (BENTYL) 20 MG tablet [id number] Yes TAKE 1 TABLET UP TO FOUR TIMES A DAY FOR GI CRAMPING, PAIN, NAUSEA, AND VOMITING AS NEEDED  [name], MD Taking Active   Dulaglutide (TRULICITY) 4.5 PP/5.0DT SOPN [id number] Yes Inject 4.5 mg once weekly [name], MD Taking Active   DULoxetine (CYMBALTA) 60 MG capsule [id number] Yes TAKE 1 CAPSULE BY MOUTH ONCE DAILY. [name], MD Taking Active   fluticasone [facility]) 50 MCG/ACT nasal spray [id number] Yes Place 2 sprays into both nostrils daily. Evlyn Courier, PA-C Taking Active Self  formoterol (PERFOROMIST) 20 MCG/2ML nebulizer solution [id number] Yes Take 2 mLs (20 mcg total) by nebulization 2 (two) times daily. [name], MD Taking Active   furosemide (LASIX) 20 MG tablet [id number] Yes TAKE THREE TABLETS BY MOUTH EVERY MORNING and TAKE THREE TABLETS BY MOUTH AT EVERY EVENING Richardson Dopp T, PA-C Taking Active   gabapentin (NEURONTIN) 300 MG capsule [id number] Yes Take 1 capsule (300 mg total) by mouth at bedtime. [name], MD Taking Active   GLOBAL EASE INJECT PEN NEEDLES 32G X 4 MM MISC [id number] Yes USE AS DIRECTED WITH VICTOZA  AND TRESIBA. [name], MD Taking Active Self  insulin degludec (TRESIBA FLEXTOUCH) 200 UNIT/ML FlexTouch Pen [id number] Yes Inject 120 Units into the skin daily. [provider] Taking Active   ipratropium-albuterol (DUONEB) 0.5-2.5 (3) MG/3ML SOLN [id number] Yes Take 3 mLs by nebulization every 6 (six) hours as needed (wheezing). [name], MD Taking Active Self  Iron, Ferrous Sulfate, 325 (65 Fe) MG TABS [id number] Yes Take 325 mg by mouth every other day. [name], MD Taking Active            Med Note Briant Cedar   Wed Jun 04, 2021  2:59 PM)    metolazone (ZAROXOLYN) 5 MG tablet [id number] Yes TAKE 1 TABLET BY MOUTH AS NEEDED. [name], MD Taking Active   MICROLET LANCETS [place] [id number] Yes 1 each by Other route. As directed [provider] Taking Active Self           Med Note Mickie Bail Oct 21, 2015  3:31 PM)    nystatin (MYCOSTATIN/NYSTOP) powder [id number] Yes Apply 1  application topically 3 (three) times daily. [name], MD Taking Active Self  ondansetron (ZOFRAN) 4 MG tablet [id number] Yes TAKE ONE TABLET BY MOUTH every EIGHT hours AS NEEDED FOR NAUSEA AND VOMITING [name], MD Taking Active   oxyCODONE-acetaminophen (PERCOCET/ROXICET) 5-325 MG tablet [id number] Yes Take 1 tablet by mouth every 6 (six) hours as needed for severe pain. Blanchie Dessert, MD Taking Active Self  pantoprazole (PROTONIX) 40 MG tablet [id number] Yes TAKE ONE TABLET BY MOUTH EVERY MORNING [name], MD Taking Active   polyethylene glycol [facility] / GLYCOLAX) packet [id number] Yes Take 17 g by mouth daily as needed for mild constipation. [provider] Taking Active Self  potassium chloride (MICRO-K) 10 MEQ CR capsule [id number] Yes TAKE 5 CAPSULES (50 MEQ TOTAL) BY MOUTH 2 (TWO) TIMES DAILY Richardson Dopp T, PA-C Taking Active Self  promethazine-dextromethorphan (PROMETHAZINE-DM) 6.25-15 MG/5ML syrup [id number] Yes Take 2.5 mLs by mouth 4 (four) times daily as needed for cough (sedation caution). [name], MD Taking Active   revefenacin [facility]) 175 MCG/3ML nebulizer solution [id number] Yes Inhale one vial in nebulizer once daily. Do not mix with other nebulized medications. [name], MD Taking Active   rOPINIRole (REQUIP) 4 MG tablet [id number] Yes TAKE 1 TABLET BY MOUTH TWICE DAILY [name], MD Taking Active   spironolactone (ALDACTONE) 25 MG tablet [id number] Yes TAKE ONE TABLET BY MOUTH ONCE DAILY [name], MD Taking Active   traMADol (ULTRAM) 50 MG tablet [id number] Yes Take 1 tablet (50 mg total) by mouth 3 (three) times daily as needed. [name], MD Taking Active Self  UNABLE TO FIND [id number] Yes CPAP- At bedtime [provider] Taking Active Self            Patient Active Problem List   Diagnosis Date Noted   Aortic atherosclerosis ([place]) 06/04/2021   Preoperative cardiovascular examination 06/04/2021    Hyperglycemia 02/14/2021   Rash 09/27/2020   COPD exacerbation ([place]) 09/21/2020   Acute exacerbation of COPD with asthma ([place]) 09/19/2020   Mixed diabetic hyperlipidemia associated with type 2 diabetes mellitus ([place]) 09/19/2020   Pain of foot 06/20/2020   Medicare annual wellness visit, initial 06/20/2020   Constipation 06/11/2020   Diverticulosis of colon 06/11/2020   Irritable bowel syndrome 01/74/9449   Periumbilical pain 67/59/1638   RLS (restless legs syndrome)    Asthma exacerbation 01/08/2020  Muscle weakness 01/08/2020   Grade I diastolic dysfunction 46/27/0350   Drug-induced myopathy 10/10/2019   Advance care planning 06/14/2019   RLQ abdominal pain 06/14/2019   COPD (chronic obstructive pulmonary disease) ([place]) 06/14/2019   Leukocytosis 06/14/2019   Hypercalcemia 06/14/2019   Gout 06/14/2019   Type 2 diabetes mellitus with hyperglycemia, without long-term current use of insulin ([place])    COPD with acute exacerbation ([place]) 05/03/2017   Diabetes mellitus without complication (HCC)    CKD (chronic kidney disease) stage 3, GFR 30-59 ml/min (HCC)    Hypersomnia with sleep apnea 09/16/2015   Fatigue 06/07/2015   Morbid obesity ([place]) 06/07/2015   (HFpEF) heart failure with preserved ejection fraction ([place])    Pneumonia of both lower lobes due to infectious organism 02/09/2013   OSA on CPAP 08/23/2012   Essential hypertension    Depression    GERD without esophagitis    Hyperlipidemia    Ulcerative colitis ([place])    Patellar tendinitis 05/25/2011   Knee pain 05/25/2011   Myofascial pain 05/25/2011   Cervicalgia 05/25/2011    Immunization History  Administered Date(s) Administered   Influenza Split 12/01/2012, 01/18/2017   Influenza Whole 12/25/2011   Influenza,inj,Quad PF,6+ Mos 11/27/2013, 12/08/2014, 12/11/2015   Influenza-Unspecified 12/07/2017   Janssen (J&J) SARS-COV-2 Vaccination 06/15/2019   Pneumococcal Conjugate-13 02/17/2017   Pneumococcal  Polysaccharide-23 11/27/2013   Tdap 04/01/2010    Conditions to be addressed/monitored:  Hypertension, Hyperlipidemia, Diabetes, Heart Failure, COPD, Asthma, Chronic Kidney Disease, and Depression  Care Plan : CCM Pharmacy Care Plan  Updates made by [name], [place] since 09/05/2021 12:00 AM     Problem: Hypertension, Hyperlipidemia, Diabetes, Heart Failure, COPD, Asthma, Chronic Kidney Disease, and Depression   Priority: High     Long-Range Goal: Disease Management   Start Date: 07/29/2020  Expected End Date: 05/24/2022  This Visit's Progress: On track  Recent Progress: On track  Priority: High  Note:   Current Barriers:  Unable to achieve control of diabetes  Unable to achieve control COPD, frequent exacerbations Does not maintain contact with specialists  Difficulty taking medications as prescribed  Pharmacist Clinical Goal(s):  Patient will contact provider office for questions/concerns as evidenced notation of same in electronic health record through collaboration with PharmD and provider.   Interventions: 1:1 collaboration with [name], MD regarding development and update of comprehensive plan of care as evidenced by provider attestation and co-signature Inter-disciplinary care team collaboration (see longitudinal plan of care) Comprehensive medication review performed; medication list updated in electronic medical record  Hyperlipidemia: (LDL goal < 70) -Uncontrolled - LDL 106 (08/2021), improved from 208 (06/2021) with Praluent; TRIG 312 likely related to uncontrolled DM -High ASCVD risk (25%) -Current treatment: Praluent 150 mg q 14 days - Appropriate, Query Effective (not started yet) -Medications previously tried: Multiple statins (atorvastatin, others) - leg weakness; Vascepa (cost) -Educated on Cholesterol goals; benefits of Prauluent -Recommend to continue current medication; follow up with cardiology for lab work as scheduled  Diabetes (A1c  goal <7%) -Uncontrolled, improving- A1c 9.0 (05/2021), pt endorses improvement in BG on higher dose of insulin and Trulicity.  -Follows with Dr [name] (established care 05/2021). Dx 2017, insulin since 2020. Hx of chronic UTIs, would avoid SGLT2-inhibitor -Fasting BG: 150-160; Denies hypoglycemia < 70 -Current medications: Trulicity 4.5 mg weekly (Thurs) - Appropriate, Query Effective Tresiba 120 units daily (midday) -Appropriate, Query Effective -Medications previously tried: Jardiance, Victoza, metformin -Current meal patterns: The patient reports she is on a salt free, sugar  free, gluten free diet but had a hard time sticking to this. She tries to limit portions.  -Reviewed benefits of CGM; pt would like to try Dexcom -Ordered Dexcom G7 to [facility] D[facility]; pt will schedule training appt when she receives it  Heart Failure (Goal: manage symptoms and prevent exacerbations) -Controlled - per pt report of symptoms. She has not been checking her weight regularly; pt has previously had trouble swallowing large tablets but does well with Potassium 10 mEq capsules.  -Followed by Dr. [name] with [place] -Last ejection fraction: 70% (Date: 03/21) -HF type: Diastolic; NYHA Class: II (slight limitation of activity) -Current treatment: Spironolactone 25 mg daily AM - Appropriate, Effective, Safe, Accessible Furosemide 20 mg - 3 tab BID- Appropriate, Effective, Safe, Accessible Potassium 10 mEq - 4 AM, 2 PM, 4 HS - Appropriate, Effective, Safe, Accessible Metolazone 2.5 mg PRN wt gain - Appropriate, Effective, Safe, Accessible -Medications previously tried: torsemide, bumetanide, losartan  -Recommended to continue current medication;  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) -Improving - pt has had multiple hospitalizations for COPD exacerbation in 2022; she is now taking her nebulizers as directed and reports doing better overall, using Duoneb and albuterol  rescue less often; she wears Oxygen at night and PRN during the day;  -Followed by Dr Halford Chessman, [facility] Pulmonology. Pt does not follow up with pulmonology as frequently as recommended.  -Pulmonary function testing: 2021 FEV1 42% predicted -Gold Grade: Gold 3 (FEV1 30-49%) -Current COPD Classification:  E (high sx, >2 exacerbations/yr) -Exacerbations requiring treatment in last 6 months: yes, multiple -Compliant with CPAP nightly -Current regimen: Pulmicort nebuilzer BID - Appropriate, Effective, Safe, Accessible Yupelri nebulizer daily -Appropriate, Effective, Safe, Accessible Albuterol/ipratropium nebulizer - takes 2-3 times per day as needed - Appropriate, Effective, Safe, Accessible Albuterol inhaler - 2 puffs every 6 hours PRN - Appropriate, Effective, Safe, Accessible -Medications previously tried: per pt inhalers less effective than nebulizers, Perforomist was unaffordable; She was previously on Advair, Incruse and most recently Trelegy but says she has stopped these because she does not like the powder inhalers. -Recommended to continue current medication; follow up with pulmonology more regularly  Depression/Anxiety/Pain(Goal: Control symptoms) -Controlled, stable per patient; she asked about changing gabapentin to 300 mg dose due to pill burden -Current treatment: Bupropion 300 mg daily - Appropriate, Effective, Safe, Accessible Cymbalta 60 mg daily - Appropriate, Effective, Safe, Accessible Gabapentin 100 mg - 3 caps HS - Appropriate, Effective, Safe, Accessible Ropinirole 4 mg BID (RLS) - Appropriate, Effective, Query Safe -Medications previously tried/failed: none -Concern that ropinirole may be causing her to fall asleep suddenly during the day. This has been discussed with patient before and per chart records, she was unable to tolerate dose reductions. We will revisit this issue periodically -Recommend to continue current medication  Medication management (Goal: improve  adherence) -Updated pill packs per patient preference to improve experience and adherence PACKS: Breakfast (10-11 AM) 1 Bupropion 300 mg XR  1 Vitamin D3 5000 IU - every other day  1 Ropinirole 4 mg 1 Pantoprazole 40 mg  1 Spironolactone 25 mg  1 Propranolol 10 mg  3 furosemide 20 mg  2 Mesalamine 1.2 gm  Mid- day (2-3 PM) 10 potassium 10 mEq (5 at a time, separated by 1-2 hrs)  Evening Meal (5-6 pm) 3 Furosemide 20 mg 1 Ferrous sulfate QOD 325 mg  2 Mesalamine 1.2 gm  Bedtime (10 PM) 1 gabapentin 300 mg  1 Duloxetine 60 mg  1  propranolol 10 mg 1 ropinirole 4 mg  1 cetirizine 10 mg  Other /VIALS: Injections: -Tresiba daily (filled 30 day supply 1/69/67) - [facility] -Trulicity 4.5 mg on Thurs or Friday (filled 30 day supply 04/07/21) - [facility] -Praluent 150 mg q 2 weeks - [facility]  As needed:  -Dicyclomine - uses as needed for flares -Tylenol - 1 before bedtime -Meloxicam 15 mg - short term for arthritis in ankle -Metolazone - for fluid overload  -Tramadol - PRN diverticulitis -Colchicine 0.6 mg PRN  Patient Goals/Self-Care Activities Patient will:  - take medications as prescribed as evidenced by patient report and record review focus on medication adherence by PILL PACKS check glucose daily, document, and provide at future appointments check blood pressure daily, document, and provide at future appointments      Medication Assistance: None required.  Patient affirms current coverage meets needs.  Medication Access: Within the past 30 days, how often has patient missed a dose of medication? 0 Is a pillbox or other method used to improve adherence? Yes  Factors that may affect medication adherence? no barriers identified Are meds synced by current pharmacy? Yes  Are meds delivered by current pharmacy? Yes  Does patient experience delays in picking up medications due to transportation concerns? No   [facility] Reviewed: Is patient disadvantaged to use [facility]?: No  Current Rx insurance plan: [employer] Name and location of Current pharmacy:  [facility] - [place], [place] - [address] [place] [address] Phone: [phone] Fax: [phone]  [id number] [place] [place]: [phone]: [phone] [facility] services reviewed with patient today?: Yes   Care Plan and Follow Up Patient Decision:  Patient agrees to Care Plan and Follow-up.  Follow Up Plan: Telephone follow up appointment with care management team member scheduled for: 2 months  LCharlene Brooke PharmD, BDavis Hospital And Medical CenterClinical Pharmacist [facility]isc[facility]Primary [facility] at [phone]

## 2021-09-05 DIAGNOSIS — I110 Hypertensive heart disease with heart failure: Secondary | ICD-10-CM

## 2021-09-05 DIAGNOSIS — E1159 Type 2 diabetes mellitus with other circulatory complications: Secondary | ICD-10-CM

## 2021-09-05 DIAGNOSIS — Z794 Long term (current) use of insulin: Secondary | ICD-10-CM | POA: Diagnosis not present

## 2021-09-05 DIAGNOSIS — I5030 Unspecified diastolic (congestive) heart failure: Secondary | ICD-10-CM | POA: Diagnosis not present

## 2021-09-05 DIAGNOSIS — J441 Chronic obstructive pulmonary disease with (acute) exacerbation: Secondary | ICD-10-CM

## 2021-09-05 NOTE — Patient Instructions (Signed)
Visit Information  Phone number for Pharmacist: [phone]   Goals Addressed   None     Care Plan : [place]  Updates made by [name], [facility] since 09/05/2021 12:00 AM     Problem: Hypertension, Hyperlipidemia, Diabetes, Heart Failure, COPD, Asthma, Chronic Kidney Disease, and Depression   Priority: High     Long-Range Goal: Disease Management   Start Date: 07/29/2020  Expected End Date: 05/24/2022  This Visit's Progress: On track  Recent Progress: On track  Priority: High  Note:   Current Barriers:  Unable to achieve control of diabetes  Unable to achieve control COPD, frequent exacerbations Does not maintain contact with specialists  Difficulty taking medications as prescribed  Pharmacist Clinical Goal(s):  Patient will contact provider office for questions/concerns as evidenced notation of same in electronic health record through collaboration with PharmD and provider.   Interventions: 1:1 collaboration with [name], MD regarding development and update of comprehensive plan of care as evidenced by provider attestation and co-signature Inter-disciplinary care team collaboration (see longitudinal plan of care) Comprehensive medication review performed; medication list updated in electronic medical record  Hyperlipidemia: (LDL goal < 70) -Uncontrolled - LDL 106 (08/2021), improved from 208 (06/2021) with Praluent; TRIG 312 likely related to uncontrolled DM -High ASCVD risk (25%) -Current treatment: Praluent 150 mg q 14 days - Appropriate, Query Effective (not started yet) -Medications previously tried: Multiple statins (atorvastatin, others) - leg weakness; Vascepa (cost) -Educated on Cholesterol goals; benefits of Prauluent -Recommend to continue current medication; follow up with cardiology for lab work as scheduled  Diabetes (A1c goal <7%) -Uncontrolled, improving- A1c 9.0 (05/2021), pt endorses improvement in BG on higher dose of  insulin and Trulicity.  -Follows with Dr [name] (established care 05/2021). Dx 2017, insulin since 2020. Hx of chronic UTIs, would avoid SGLT2-inhibitor -Fasting BG: 150-160; Denies hypoglycemia < 70 -Current medications: Trulicity 4.5 mg weekly (Thurs) - Appropriate, Query Effective Tresiba 120 units daily (midday) -Appropriate, Query Effective -Medications previously tried: Jardiance, Victoza, metformin -Current meal patterns: The patient reports she is on a salt free, sugar free, gluten free diet but had a hard time sticking to this. She tries to limit portions.  -Reviewed benefits of CGM; pt would like to try Dexcom -Ordered Dexcom G7 to [facility] DME; pt will schedule training appt when she receives it  Heart Failure (Goal: manage symptoms and prevent exacerbations) -Controlled - per pt report of symptoms. She has not been checking her weight regularly; pt has previously had trouble swallowing large tablets but does well with Potassium 10 mEq capsules.  -Followed by Dr. [name] with [facility] -Last ejection fraction: 70% (Date: 03/21) -HF type: Diastolic; NYHA Class: II (slight limitation of activity) -Current treatment: Spironolactone 25 mg daily AM - Appropriate, Effective, Safe, Accessible Furosemide 20 mg - 3 tab BID- Appropriate, Effective, Safe, Accessible Potassium 10 mEq - 4 AM, 2 PM, 4 HS - Appropriate, Effective, Safe, Accessible Metolazone 2.5 mg PRN wt gain - Appropriate, Effective, Safe, Accessible -Medications previously tried: torsemide, bumetanide, losartan  -Recommended to continue current medication;  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) -Improving - pt has had multiple hospitalizations for COPD exacerbation in 2022; she is now taking her nebulizers as directed and reports doing better overall, using Duoneb and albuterol rescue less often; she wears Oxygen at night and PRN during the day;  -Followed by Dr Halford Chessman, [facility]  Pulmonology. Pt does not follow up with pulmonology as  frequently as recommended.  -Pulmonary function testing: 2021 FEV1 42% predicted -Gold Grade: Gold 3 (FEV1 30-49%) -Current COPD Classification:  E (high sx, >2 exacerbations/yr) -Exacerbations requiring treatment in last 6 months: yes, multiple -Compliant with CPAP nightly -Current regimen: Pulmicort nebuilzer BID - Appropriate, Effective, Safe, Accessible Yupelri nebulizer daily -Appropriate, Effective, Safe, Accessible Albuterol/ipratropium nebulizer - takes 2-3 times per day as needed - Appropriate, Effective, Safe, Accessible Albuterol inhaler - 2 puffs every 6 hours PRN - Appropriate, Effective, Safe, Accessible -Medications previously tried: per pt inhalers less effective than nebulizers, Perforomist was unaffordable; She was previously on Advair, Incruse and most recently Trelegy but says she has stopped these because she does not like the powder inhalers. -Recommended to continue current medication; follow up with pulmonology more regularly  Depression/Anxiety/Pain(Goal: Control symptoms) -Controlled, stable per patient; she asked about changing gabapentin to 300 mg dose due to pill burden -Current treatment: Bupropion 300 mg daily - Appropriate, Effective, Safe, Accessible Cymbalta 60 mg daily - Appropriate, Effective, Safe, Accessible Gabapentin 100 mg - 3 caps HS - Appropriate, Effective, Safe, Accessible Ropinirole 4 mg BID (RLS) - Appropriate, Effective, Query Safe -Medications previously tried/failed: none -Concern that ropinirole may be causing her to fall asleep suddenly during the day. This has been discussed with patient before and per chart records, she was unable to tolerate dose reductions. We will revisit this issue periodically -Recommend to continue current medication  Medication management (Goal: improve adherence) -Updated pill packs per patient preference to improve experience and adherence PACKS: Breakfast  (10-11 AM) 1 Bupropion 300 mg XR  1 Vitamin D3 5000 IU - every other day  1 Ropinirole 4 mg 1 Pantoprazole 40 mg  1 Spironolactone 25 mg  1 Propranolol 10 mg  3 furosemide 20 mg  2 Mesalamine 1.2 gm  Mid- day (2-3 PM) 10 potassium 10 mEq (5 at a time, separated by 1-2 hrs)  Evening Meal (5-6 pm) 3 Furosemide 20 mg 1 Ferrous sulfate QOD 325 mg  2 Mesalamine 1.2 gm  Bedtime (10 PM) 1 gabapentin 300 mg  1 Duloxetine 60 mg  1 propranolol 10 mg 1 ropinirole 4 mg  1 cetirizine 10 mg  Other /VIALS: Injections: -Tresiba daily (filled 30 day supply 6/80/88) [facility] -Trulicity 4.5 mg on Thurs or Friday (filled 30 day supply 04/07/21) [facility] -[facility]raluent 150 mg q 2 weeks - [facility]  As needed:  -Dicyclomine - uses as needed for flares -Tylenol - 1 before bedtime -Meloxicam 15 mg - short term for arthritis in ankle -Metolazone - for fluid overload  -Tramadol - PRN diverticulitis -Colchicine 0.6 mg PRN  Patient Goals/Self-Care Activities Patient will:  - take medications as prescribed as evidenced by patient report and record review focus on medication adherence by PILL PACKS check glucose daily, document, and provide at future appointments check blood pressure daily, document, and provide at future appointments        Patient verbalizes understanding of instructions and care plan provided today and agrees to view in [place]. Active MyChart status and patient understanding of how to access instructions and care plan via MyChart confirmed with patient.    Telephone follow up appointment with pharmacy team member scheduled for: 2 months  [name], PharmD, [facility] Clinical Pharmacist [place] [phone]

## 2021-09-10 ENCOUNTER — Ambulatory Visit: Payer: Medicare HMO | Admitting: Pulmonary Disease

## 2021-09-10 DIAGNOSIS — G4733 Obstructive sleep apnea (adult) (pediatric): Secondary | ICD-10-CM | POA: Diagnosis not present

## 2021-09-10 DIAGNOSIS — Z794 Long term (current) use of insulin: Secondary | ICD-10-CM | POA: Diagnosis not present

## 2021-09-10 DIAGNOSIS — E1165 Type 2 diabetes mellitus with hyperglycemia: Secondary | ICD-10-CM | POA: Diagnosis not present

## 2021-09-11 ENCOUNTER — Other Ambulatory Visit: Payer: Self-pay | Admitting: Family Medicine

## 2021-09-11 DIAGNOSIS — E1165 Type 2 diabetes mellitus with hyperglycemia: Secondary | ICD-10-CM

## 2021-09-16 ENCOUNTER — Ambulatory Visit (INDEPENDENT_AMBULATORY_CARE_PROVIDER_SITE_OTHER): Payer: Medicare HMO

## 2021-09-16 DIAGNOSIS — I5032 Chronic diastolic (congestive) heart failure: Secondary | ICD-10-CM

## 2021-09-16 DIAGNOSIS — J449 Chronic obstructive pulmonary disease, unspecified: Secondary | ICD-10-CM

## 2021-09-16 DIAGNOSIS — E1165 Type 2 diabetes mellitus with hyperglycemia: Secondary | ICD-10-CM

## 2021-09-16 NOTE — Patient Instructions (Signed)
Visit Information Kathleen Hatfield Congratulations on achieving your goals! It was a pleasure working with you, and I hope you continue to make great strides in improving your health.  Follow up Plan: If further intervention is needed the care management team is available to follow up after a formal CCM referral is placed by your primary care provider.   Please continue to follow these recommendations: Continue to take medication as prescribed and refill timely Call practice pharmacist, Lindsey Foltanski for assistance with DEXCOM system.  Continue to keep follow up appointments - Check blood sugars daily.   Check blood sugar if you feel it is too high or too low and take your blood sugar readings or monitor to your doctor appointment.  - Weigh daily and record weights.  Follow heart failure action plan and advised by your provider.   No further follow up needed:  Goals have been met.  Case is closed with nurse case manager.   Please call the care guide team at [phone] if you need to cancel or reschedule your appointment.   If you are experiencing a Mental Health or Behavioral Health Crisis or need someone to talk to, please call the Suicide and Crisis Lifeline: 988 call 1-800-273-TALK (toll free, 24 hour hotline)   Patient verbalizes understanding of instructions and care plan provided today and agrees to view in MyChart. Active MyChart status and patient understanding of how to access instructions and care plan via MyChart confirmed with patient.       RN,BSN,CCM RN Care Manager Coordinator [place]  [phone]  

## 2021-09-16 NOTE — Chronic Care Management (AMB) (Cosign Needed Addendum)
Chronic Care Management   CCM RN Visit Note  09/16/2021 Name: Kathleen Hatfield MRN: [id number] DOB: 26-Feb-1956  Subjective: Kathleen Hatfield is a 66 y.o. year old female who is a primary care patient of [name], MD. The care management team was consulted for assistance with disease management and care coordination needs.    Engaged with patient by telephone for follow up visit in response to provider referral for case management and/or care coordination services.   Consent to Services:  The patient was given the following information about Chronic Care Management services today, agreed to services, and gave verbal consent: 1. CCM service includes personalized support from designated clinical staff supervised by the primary care provider, including individualized plan of care and coordination with other care providers 2. 24/7 contact phone numbers for assistance for urgent and routine care needs. 3. Service will only be billed when office clinical staff spend 20 minutes or more in a month to coordinate care. 4. Only one practitioner may furnish and bill the service in a calendar month. 5.The patient may stop CCM services at any time (effective at the end of the month) by phone call to the office staff. 6. The patient will be responsible for cost sharing (co-pay) of up to 20% of the service fee (after annual deductible is met). Patient agreed to services and consent obtained.  Patient agreed to services and verbal consent obtained.   Assessment: Review of patient past medical history, allergies, medications, health status, including review of consultants reports, laboratory and other test data, was performed as part of comprehensive evaluation and provision of chronic care management services.   SDOH (Social Determinants of Health) assessments and interventions performed:    CCM Care Plan  Allergies  Allergen Reactions   Almond Oil Anaphylaxis, Shortness Of Breath and Swelling   Morphine And  Related Shortness Of Breath and Other (See Comments)    Pt. States while in the hospital it affected her breathing, O2 dropped to the 70's   Atorvastatin Other (See Comments)    Leg weakness   Ceclor [Cefaclor] Nausea And Vomiting   Sulfa Antibiotics Nausea And Vomiting   Ciprofloxacin Other (See Comments)    Makes joints and muscles ache   Levaquin [Levofloxacin] Other (See Comments)    Body aches   Losartan Other (See Comments)    Weakness    Statins Other (See Comments)    Leg and body weakness    Outpatient Encounter Medications as of 09/16/2021  Medication Sig   acetaminophen (TYLENOL) 500 MG tablet Take 500 mg by mouth every 6 (six) hours as needed for moderate pain.   albuterol (VENTOLIN HFA) 108 (90 Base) MCG/ACT inhaler Inhale 2 puffs into the lungs every 6 (six) hours as needed.   Alirocumab (PRALUENT) 150 MG/ML SOAJ Inject 1 pen. into the skin every 14 (fourteen) days.   aspirin EC 81 MG tablet Take 1 tablet (81 mg total) by mouth daily. Swallow whole.   BAYER CONTOUR NEXT TEST test strip 1 each by Other route daily. As directed   budesonide (PULMICORT) 0.5 MG/2ML nebulizer solution Take 2 mLs (0.5 mg total) by nebulization 2 (two) times daily.   buPROPion (WELLBUTRIN XL) 300 MG 24 hr tablet TAKE ONE TABLET BY MOUTH EVERY MORNING   Cholecalciferol (VITAMIN D-3) 5000 UNITS TABS Take 5,000 Units by mouth every other day.    colchicine 0.6 MG tablet TAKE 1 TABLET (0.6 MG TOTAL) BY MOUTH DAILY AS NEEDED (FOR GOUT).  Continuous Blood Gluc Receiver ([facility]) DEVI by Does not apply route.   Continuous Blood Gluc Sensor (DEXCOM G7 SENSOR) MISC by Does not apply route. Apply sensor every 10 days   dicyclomine (BENTYL) 20 MG tablet TAKE 1 TABLET UP TO FOUR TIMES A DAY FOR GI CRAMPING, PAIN, NAUSEA, AND VOMITING AS NEEDED   DULoxetine (CYMBALTA) 60 MG capsule TAKE 1 CAPSULE BY MOUTH ONCE DAILY.   fluticasone (FLONASE) 50 MCG/ACT nasal spray Place 2 sprays into both nostrils  daily.   formoterol (PERFOROMIST) 20 MCG/2ML nebulizer solution Take 2 mLs (20 mcg total) by nebulization 2 (two) times daily.   furosemide (LASIX) 20 MG tablet TAKE THREE TABLETS BY MOUTH EVERY MORNING and TAKE THREE TABLETS BY MOUTH AT EVERY EVENING   gabapentin (NEURONTIN) 300 MG capsule Take 1 capsule (300 mg total) by mouth at bedtime.   GLOBAL EASE INJECT PEN NEEDLES 32G X 4 MM MISC USE AS DIRECTED WITH VICTOZA AND TRESIBA.   insulin degludec (TRESIBA FLEXTOUCH) 200 UNIT/ML FlexTouch Pen Inject 120 Units into the skin daily.   ipratropium-albuterol (DUONEB) 0.5-2.5 (3) MG/3ML SOLN Take 3 mLs by nebulization every 6 (six) hours as needed (wheezing).   Iron, Ferrous Sulfate, 325 (65 Fe) MG TABS Take 325 mg by mouth every other day.   metolazone (ZAROXOLYN) 5 MG tablet TAKE 1 TABLET BY MOUTH AS NEEDED.   MICROLET LANCETS MISC 1 each by Other route. As directed   nystatin (MYCOSTATIN/NYSTOP) powder Apply 1 application topically 3 (three) times daily.   ondansetron (ZOFRAN) 4 MG tablet TAKE ONE TABLET BY MOUTH every EIGHT hours AS NEEDED FOR NAUSEA AND VOMITING   oxyCODONE-acetaminophen (PERCOCET/ROXICET) 5-325 MG tablet Take 1 tablet by mouth every 6 (six) hours as needed for severe pain.   pantoprazole (PROTONIX) 40 MG tablet TAKE ONE TABLET BY MOUTH EVERY MORNING   polyethylene glycol (MIRALAX / GLYCOLAX) packet Take 17 g by mouth daily as needed for mild constipation.   potassium chloride (MICRO-K) 10 MEQ CR capsule TAKE 5 CAPSULES (50 MEQ TOTAL) BY MOUTH 2 (TWO) TIMES DAILY   promethazine-dextromethorphan (PROMETHAZINE-DM) 6.25-15 MG/5ML syrup Take 2.5 mLs by mouth 4 (four) times daily as needed for cough (sedation caution).   revefenacin (YUPELRI) 175 MCG/3ML nebulizer solution Inhale one vial in nebulizer once daily. Do not mix with other nebulized medications.   rOPINIRole (REQUIP) 4 MG tablet TAKE 1 TABLET BY MOUTH TWICE DAILY   spironolactone (ALDACTONE) 25 MG tablet TAKE ONE TABLET BY  MOUTH ONCE DAILY   traMADol (ULTRAM) 50 MG tablet Take 1 tablet (50 mg total) by mouth 3 (three) times daily as needed.   TRULICITY 4.5 SA/6.3KZ SOPN INJECT 4.5 MG ONCE WEEKLY   UNABLE TO FIND CPAP- At bedtime   No facility-administered encounter medications on file as of 09/16/2021.    Patient Active Problem List   Diagnosis Date Noted   Aortic atherosclerosis ([place]) 06/04/2021   Preoperative cardiovascular examination 06/04/2021   Hyperglycemia 02/14/2021   Rash 09/27/2020   COPD exacerbation ([place]) 09/21/2020   Acute exacerbation of COPD with asthma ([place]) 09/19/2020   Mixed diabetic hyperlipidemia associated with type 2 diabetes mellitus ([place]) 09/19/2020   Pain of foot 06/20/2020   Medicare annual wellness visit, initial 06/20/2020   Constipation 06/11/2020   Diverticulosis of colon 06/11/2020   Irritable bowel syndrome 60/12/9321   Periumbilical pain 55/73/2202   RLS (restless legs syndrome)    Asthma exacerbation 01/08/2020   Muscle weakness 01/08/2020   Grade I diastolic dysfunction  01/08/2020   Drug-induced myopathy 10/10/2019   Advance care planning 06/14/2019   RLQ abdominal pain 06/14/2019   COPD (chronic obstructive pulmonary disease) ([place]) 06/14/2019   Leukocytosis 06/14/2019   Hypercalcemia 06/14/2019   Gout 06/14/2019   Type 2 diabetes mellitus with hyperglycemia, without long-term current use of insulin (HCC)    COPD with acute exacerbation ([place]) 05/03/2017   Diabetes mellitus without complication (HCC)    CKD (chronic kidney disease) stage 3, GFR 30-59 ml/min (HCC)    Hypersomnia with sleep apnea 09/16/2015   Fatigue 06/07/2015   Morbid obesity ([place]) 06/07/2015   (HFpEF) heart failure with preserved ejection fraction (HCC)    Pneumonia of both lower lobes due to infectious organism 02/09/2013   OSA on CPAP 08/23/2012   Essential hypertension    Depression    GERD without esophagitis    Hyperlipidemia    Ulcerative colitis ([place])    Patellar tendinitis  05/25/2011   Knee pain 05/25/2011   Myofascial pain 05/25/2011   Cervicalgia 05/25/2011    Conditions to be addressed/monitored:CHF, COPD, and DMII  Care Plan : [facility] plan of care  Updates made by Dannielle Karvonen, RN since 09/16/2021 12:00 AM     Problem: chronic disease management, education and/ or care coordination   Priority: High     Long-Range Goal: Development of plan of care to address chronic disease management and / or care coordination needs.   Start Date: 02/07/2021  Expected End Date: 10/06/2021  Priority: High  Note:   Goals met. Case closed Current Barriers:  Knowledge Deficits related to plan of care for management of COPD and DMII  Chronic Disease Management support and education needs related to COPD and DMII Patient reports having follow up with pulmonologist on 08/27/21.  She reports restarting inhaler Perforomist and taking all other inhalers as prescribed by provider. Patient states she has noticed some improvement in her shortness of breath.  She states the wheezing has stopped.  Patient states she continues to weigh daily.  Denies any increase swelling in lower extremities. Patient states she continues to adhere to her heart failure action plan by taking an extra metolazone as prescribed if weight increases 3 lbs overnight or 5 lbs in a week.  Patient reports today's blood sugar was 136. She states her blood sugars ranged from 105 to 160.  Denies blood sugars < 70.  Patient states she received her DEXCOM but has not set it up yet.  RNCM advised patient to contact the embedded practice pharmacist Deanne Coffer for assistance with [facility].  Contact phone number provided to patient. Patient reports having follow up with urologist.  She states she was diagnosed with a urinary tract infection and has since completed antibiotics. She states she is scheduled to follow up again with urologist.   Patient denied any further needs at this time.  Case management goals reviewed.  Patient closed to nursing case management services due to goals being met.  RNCM Clinical Goal(s):  Patient will verbalize basic understanding of COPD and DMII disease process and self health management plan as evidenced by patient report and/ or notation in chart.  take all medications exactly as prescribed and will call provider for medication related questions as evidenced by patient self report and/ or notation in chart    attend all scheduled medical appointments:   as evidenced by patient self report and/ or notation in chart.         continue to work with Consulting civil engineer  and/or Education officer, museum to address care management and care coordination needs related to COPD and DMII as evidenced by adherence to CM Team Scheduled appointments     through collaboration with Consulting civil engineer, provider, and care team.   Interventions: 1:1 collaboration with primary care provider regarding development and update of comprehensive plan of care as evidenced by provider attestation and co-signature Inter-disciplinary care team collaboration (see longitudinal plan of care) Evaluation of current treatment plan related to  self management and patient's adherence to plan as established by provider  COPD Interventions:  (Status:  Goal Met.)  Discussed COPD self care/management/and exacerbation prevention Advised patient to self assesses COPD action plan zone and make appointment with provider if in the yellow zone for 48 hours without improvement Advised patient to call provider office to report ongoing symptoms, productive cough, general malaise  Reviewed medications and discussed importance of compliance.  Reviewed scheduled appointments  Diabetes Interventions:  (Status:  Goal Met.)  Assessed patient's understanding of A1c goal: <7% Reviewed medications with patient and discussed importance of medication adherence Discussed plans with patient for ongoing care management follow up and provided patient with direct  contact information for care management team Reviewed scheduled/upcoming provider appointments Advised patient to call practice pharmacist for assistance with [facility] placement.  Notified Bonne Dolores, pharmacist of patient being closed to follow up with RNCM due to goals being met.  Lab Results  Component Value Date   HGBA1C 9.0 (A) 05/14/2021   Heart Failure Interventions:  (Status:  Goal Met.)  Reviewed Heart Failure Action Plan Discussed importance of daily weight and advised patient to weigh and record daily Discussed the importance of keeping all appointments with provider   Reviewed medications and discussed importance of compliance. Advised patient to follow heart failure action plan for weight gain as recommended by provider  Patient Goals/Self-Care Activities: Continue to take medication as prescribed and refill timely Call practice pharmacist, [name] for assistance with [facility] system.  Continue to keep follow up appointments - Check blood sugars daily.   Check blood sugar if you feel it is too high or too low and take your blood sugar readings or monitor to your doctor appointment.  - Weigh daily and record weights.  Follow heart failure action plan and advised by your provider.        Plan:No further follow up required: Goals met. Case closed Quinn Plowman RN,BSN,CCM RN Care Manager Coordinator [place]  [phone]

## 2021-09-18 DIAGNOSIS — J449 Chronic obstructive pulmonary disease, unspecified: Secondary | ICD-10-CM | POA: Diagnosis not present

## 2021-09-22 ENCOUNTER — Ambulatory Visit: Payer: Medicare HMO | Admitting: Cardiovascular Disease

## 2021-09-22 ENCOUNTER — Encounter: Payer: Self-pay | Admitting: Cardiovascular Disease

## 2021-09-22 VITALS — BP 120/74 | HR 81 | Ht 65.0 in | Wt 257.0 lb

## 2021-09-22 DIAGNOSIS — E782 Mixed hyperlipidemia: Secondary | ICD-10-CM | POA: Diagnosis not present

## 2021-09-22 DIAGNOSIS — I5032 Chronic diastolic (congestive) heart failure: Secondary | ICD-10-CM

## 2021-09-22 DIAGNOSIS — N1831 Chronic kidney disease, stage 3a: Secondary | ICD-10-CM

## 2021-09-22 DIAGNOSIS — I700 Atherosclerosis of aorta: Secondary | ICD-10-CM | POA: Diagnosis not present

## 2021-09-22 LAB — BASIC METABOLIC PANEL
BUN/Creatinine Ratio: 18 (ref 12–28)
BUN: 27 mg/dL (ref 8–27)
CO2: 29 mmol/L (ref 20–29)
Calcium: 10.5 mg/dL — ABNORMAL HIGH (ref 8.7–10.3)
Chloride: 94 mmol/L — ABNORMAL LOW (ref 96–106)
Creatinine, Ser: 1.47 mg/dL — ABNORMAL HIGH (ref 0.57–1.00)
Glucose: 163 mg/dL — ABNORMAL HIGH (ref 70–99)
Potassium: 4.5 mmol/L (ref 3.5–5.2)
Sodium: 140 mmol/L (ref 134–144)
eGFR: 39 mL/min/{1.73_m2} — ABNORMAL LOW (ref >59)

## 2021-09-22 NOTE — Patient Instructions (Signed)
Medication Instructions:  Your physician recommends that you continue on your current medications as directed. Please refer to the Current Medication list given to you today.  *If you need a refill on your cardiac medications before your next appointment, please call your pharmacy*   Lab Work: BMET today If you have labs (blood work) drawn today and your tests are completely normal, you will receive your results only by: [place] (if you have MyChart) OR A paper copy in the mail If you have any lab test that is abnormal or we need to change your treatment, we will call you to review the results.   Testing/Procedures: NONE   Follow-Up: At [facility], you and your health needs are our priority.  As part of our continuing mission to provide you with exceptional heart care, we have created designated Provider Care Teams.  These Care Teams include your primary Cardiologist (physician) and Advanced Practice Providers (APPs -  Physician Assistants and Nurse Practitioners) who all work together to provide you with the care you need, when you need it.  Your next appointment:   6 month(s)  The format for your next appointment:   In Person  Provider:   Richardson Dopp, PA or Christen Bame, NP. Then, [name], MD will plan to see you in 1 year(s).   Important Information About Sugar

## 2021-09-22 NOTE — Progress Notes (Signed)
Cardiology Office Note:    Date:  09/22/2021   ID:  Kathleen Hatfield, DOB 1955-11-29, MRN [id number]  PCP:  [name], MD   [facility] Prov[facility] Cardiologist:  [name], MD Cardiology APP:  [name]     Referring MD: [name], MD   Chief Complaint  Patient presents with   Shortness of Breath    History of Present Illness:    Kathleen Hatfield is a 66 y.o. 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 The patient has a pending colonoscopy with Dr Benson Norway. She discontinued her beta blocker about 3-4 weeks ago and is feeling better since stopping it.  She continues to use diuretics on a sliding scale with furosemide 60 mg twice daily and spironolactone 25 mg daily as scheduled medications.  She uses metolazone as needed for 5 pound weight gain which she estimates that she uses once weekly.  Overall she feels all of this is stable.  She has not had any recent chest pain or pressure.  Leg edema has been well controlled.  No recent orthopnea or PND.  She continues to use oxygen at home.  Past Medical History:  Diagnosis Date   Anemia    Arthritis    Asthma    Chronic diastolic CHF 39/7673   Echocardiogram 05/2019: EF 70, no RWMA, mild LVH, Gr 1 DD, normal RVSF, severe LVH, borderline asc Aorta (39 mm)   CKD (chronic kidney disease)    Depression    Diabetes mellitus without complication (HCC)    Diverticulitis    Dyspnea     with exertion   GERD (gastroesophageal reflux disease)    History of blood transfusion    Hyperlipidemia    cannot tolerate statins   Hypertension    patient states she has never had high blood pressure.    Nuclear stress test    Myoview 05/2019: EF 83, no ischemia or infarction; Low Risk   Peripheral neuropathy    Pneumonia    PONV (postoperative nausea and vomiting)    RLS (restless legs syndrome)    Sleep apnea    uses CPAP   Ulcerative colitis ([place])    dr Collene Mares    Past Surgical History:  Procedure Laterality Date   ABDOMINAL HYSTERECTOMY     APPENDECTOMY     BREAST BIOPSY Left    BREAST SURGERY     left biopsy   CARDIAC CATHETERIZATION     CESAREAN SECTION     CHOLECYSTECTOMY     HERNIA REPAIR     INSERTION OF MESH N/A 11/15/2015   Procedure: INSERTION OF MESH;  Surgeon: Dominic Mahaney Boston, MD;  Location: [facility];  Service: General;  Laterality: N/A;   LAPAROSCOPIC LYSIS OF ADHESIONS N/A 11/15/2015   Procedure: LAPAROSCOPIC LYSIS OF ADHESIONS;  Surgeon: Shaleen Talamantez Boston, MD;  Location: [facility];  Service: General;  Laterality: N/A;   RIGHT HEART CATHETERIZATION N/A 02/27/2013   Procedure: RIGHT HEART CATH;  Surgeon: Blane Ohara, MD;  Location: [facility]ATH [facility];  Service: Cardiovascular;  Laterality: N/A;   RIGHT HEART CATHETERIZATION N/A 12/14/2013   Procedure: RIGHT HEART CATH;  Surgeon: Larey Dresser, MD;  Location: [facility] CATH LAB;  Service: Cardiovascular;  Laterality: N/A;   SIGMOIDECTOMY  2010   diverticular disease   VENTRAL HERNIA REPAIR N/A 11/15/2015   Procedure: LAPAROSCOPIC VENTRAL WALL HERNIA REPAIR;  Surgeon:  Boston, MD;  Location: [facility];  Service: General;  Laterality: N/A;    Current Medications: Current Meds  Medication Sig   acetaminophen (TYLENOL) 500 MG tablet Take 500 mg by mouth every 6 (six) hours as needed for moderate pain.   albuterol (VENTOLIN HFA) 108 (90 Base) MCG/ACT inhaler Inhale 2 puffs into the lungs every 6 (six) hours as needed.    Alirocumab (PRALUENT) 150 MG/ML SOAJ Inject 1 pen. into the skin every 14 (fourteen) days.   aspirin EC 81 MG tablet Take 1 tablet (81 mg total) by mouth daily. Swallow whole.   BAYER CONTOUR NEXT TEST test strip 1 each by Other route daily. As directed   budesonide (PULMICORT) 0.5 MG/2ML nebulizer solution Take 2 mLs (0.5 mg total) by nebulization 2 (two) times daily.   buPROPion (WELLBUTRIN XL) 300 MG 24 hr tablet TAKE ONE TABLET BY MOUTH EVERY MORNING   Cholecalciferol (VITAMIN D-3) 5000 UNITS TABS Take 5,000 Units by mouth every other day.    colchicine 0.6 MG tablet TAKE 1 TABLET (0.6 MG TOTAL) BY MOUTH DAILY AS NEEDED (FOR GOUT).   Continuous Blood Gluc Receiver ([place]) DEVI by Does not apply route.   Continuous Blood Gluc Sensor (DEXCOM G7 SENSOR) MISC by Does not apply route. Apply sensor every 10 days   dicyclomine (BENTYL) 20 MG tablet TAKE 1 TABLET UP TO FOUR TIMES A DAY FOR GI CRAMPING, PAIN, NAUSEA, AND VOMITING AS NEEDED   DULoxetine (CYMBALTA) 60 MG capsule TAKE 1 CAPSULE BY MOUTH ONCE DAILY.   fluticasone (FLONASE) 50 MCG/ACT nasal spray Place 2 sprays into both nostrils daily. (Patient taking differently: Place 2 sprays into both nostrils as needed.)   formoterol (PERFOROMIST) 20 MCG/2ML nebulizer solution Take 2 mLs (20 mcg total) by nebulization 2 (two) times daily.   furosemide (LASIX) 20 MG tablet TAKE THREE TABLETS BY MOUTH EVERY MORNING and TAKE THREE TABLETS BY MOUTH AT EVERY EVENING   gabapentin (NEURONTIN) 300 MG capsule Take 1 capsule (300 mg total) by mouth at bedtime.   GLOBAL EASE INJECT PEN NEEDLES 32G X 4 MM MISC USE AS DIRECTED WITH VICTOZA AND TRESIBA.   insulin degludec (TRESIBA FLEXTOUCH) 200 UNIT/ML FlexTouch Pen Inject 120 Units into the skin daily.   ipratropium-albuterol (DUONEB) 0.5-2.5 (3) MG/3ML SOLN Take 3 mLs by nebulization every 6 (six) hours as needed (wheezing).   Iron, Ferrous Sulfate, 325 (65 Fe) MG TABS Take 325 mg by mouth every  other day.   metolazone (ZAROXOLYN) 5 MG tablet TAKE 1 TABLET BY MOUTH AS NEEDED.   MICROLET LANCETS MISC 1 each by Other route. As directed   nystatin (MYCOSTATIN/NYSTOP) powder Apply 1 application topically 3 (three) times daily. (Patient taking differently: Apply 1 application  topically as needed.)   ondansetron (ZOFRAN) 4 MG tablet TAKE ONE TABLET BY MOUTH every EIGHT hours AS NEEDED FOR NAUSEA AND VOMITING   pantoprazole (PROTONIX) 40 MG tablet TAKE ONE TABLET BY MOUTH EVERY MORNING   polyethylene glycol (MIRALAX / GLYCOLAX) packet Take 17 g by mouth daily as needed for mild constipation.   potassium chloride (MICRO-K)  10 MEQ CR capsule TAKE 5 CAPSULES (50 MEQ TOTAL) BY MOUTH 2 (TWO) TIMES DAILY   promethazine-dextromethorphan (PROMETHAZINE-DM) 6.25-15 MG/5ML syrup Take 2.5 mLs by mouth 4 (four) times daily as needed for cough (sedation caution).   revefenacin (YUPELRI) 175 MCG/3ML nebulizer solution Inhale one vial in nebulizer once daily. Do not mix with other nebulized medications.   rOPINIRole (REQUIP) 4 MG tablet TAKE 1 TABLET BY MOUTH TWICE DAILY   spironolactone (ALDACTONE) 25 MG tablet TAKE ONE TABLET BY MOUTH ONCE DAILY   traMADol (ULTRAM) 50 MG tablet Take 1 tablet (50 mg total) by mouth 3 (three) times daily as needed.   TRULICITY 4.5 UE/2.8MK SOPN INJECT 4.5 MG ONCE WEEKLY   UNABLE TO FIND CPAP- At bedtime     Allergies:   Almond oil, Morphine and related, Atorvastatin, Ceclor [cefaclor], Sulfa antibiotics, Ciprofloxacin, Levaquin [levofloxacin], Losartan, and Statins   Social History   Socioeconomic History   Marital status: Divorced    Spouse name: Not on file   Number of children: Not on file   Years of education: Not on file   Highest education level: Not on file  Occupational History   Occupation: unemployed  Tobacco Use   Smoking status: Never   Smokeless tobacco: Never  Vaping Use   Vaping Use: Never used  Substance and Sexual Activity   Alcohol use: No    Drug use: No   Sexual activity: Not on file  Other Topics Concern   Not on file  Social History Narrative   Live with spouse   Right handed   Social Determinants of Health   Financial Resource Strain: Low Risk  (07/10/2021)   Overall Financial Resource Strain (CARDIA)    Difficulty of Paying Living Expenses: Not hard at all  Food Insecurity: No Food Insecurity (07/10/2021)   Hunger Vital Sign    Worried About Running Out of Food in the Last Year: Never true    Ran Out of Food in the Last Year: Never true  Transportation Needs: No Transportation Needs (07/10/2021)   PRAPARE - Hydrologist (Medical): No    Lack of Transportation (Non-Medical): No  Physical Activity: Inactive (07/10/2021)   Exercise Vital Sign    Days of Exercise per Week: 0 days    Minutes of Exercise per Session: 0 min  Stress: No Stress Concern Present (07/10/2021)   [place]    Feeling of Stress : Not at all  Social Connections: Moderately Isolated (07/10/2021)   Social Connection and Isolation Panel [NHANES]    Frequency of Communication with Friends and Family: More than three times a week    Frequency of Social Gatherings with Friends and Family: Once a week    Attends Religious Services: Never    Marine scientist or Organizations: No    Attends Music therapist: Never    Marital Status: Living with partner     Family History: The patient's family history includes COPD in her father; Dementia in her mother; Diabetes in her maternal grandmother; Heart disease in her brother, father, and mother; Hypertension in her father and mother; Other in her brother and brother. There is no history of Breast cancer or Colon cancer.  ROS:   Please see the history of present illness.    All other systems reviewed and are negative.  EKGs/Labs/Other Studies Reviewed:    The following studies were reviewed  today: Echo 05/18/2019:  1. Left ventricular ejection fraction, by estimation, is 70%. The left  ventricle has hyperdynamic function. The left ventricle has no regional  wall motion abnormalities. There is mild left ventricular hypertrophy.  Left ventricular diastolic parameters  are consistent with Grade I diastolic dysfunction (impaired relaxation).   2. Right ventricular systolic function is normal. The right ventricular  size is normal. severely increased right ventricular wall thickness.  Tricuspid regurgitation signal is inadequate for assessing PA pressure.   3. The mitral valve is normal in structure. No evidence of mitral valve  regurgitation. No evidence of mitral stenosis.   4. The aortic valve was not well visualized. Aortic valve regurgitation  is not visualized. No aortic stenosis is present.   5. Aortic dilatation noted. There is borderline dilatation of the  ascending aorta measuring 39 mm.   6. The inferior vena cava is normal in size with greater than 50%  respiratory variability, suggesting right atrial pressure of 3 mmHg.   7. Increased flow velocities may be secondary to anemia, thyrotoxicosis,  hyperdynamic ventricles, or high flow state.   EKG:  EKG is ordered today.  The ekg ordered today demonstrates sinus rhythm 81 bpm, right bundle branch block, left axis deviation.  Recent Labs: 02/14/2021: B Natriuretic Peptide 40.3 02/27/2021: Pro B Natriuretic peptide (BNP) 23.0 03/31/2021: BUN 20; Creatinine, Ser 1.24; Hemoglobin 13.6; Platelets 328; Potassium 3.8; Sodium 137 09/02/2021: ALT 27  Recent Lipid Panel    Component Value Date/Time   CHOL 202 (H) 09/02/2021 0850   CHOL 333 04/26/2019 0000   TRIG 312 (H) 09/02/2021 0850   TRIG 469 (A) 04/26/2019 0000   HDL 43 09/02/2021 0850   CHOLHDL 4.7 (H) 09/02/2021 0850   CHOLHDL 6.9 03/25/2013 0500   VLDL 69 (H) 03/25/2013 0500   LDLCALC 106 (H) 09/02/2021 0850     Risk Assessment/Calculations:            Physical Exam:    VS:  BP 120/74   Pulse 81   Ht 5' 5"  (1.651 m)   Wt 257 lb (116.6 kg)   SpO2 92%   BMI 42.77 kg/m     Wt Readings from Last 3 Encounters:  09/22/21 257 lb (116.6 kg)  08/27/21 260 lb 9.6 oz (118.2 kg)  06/04/21 256 lb 3.2 oz (116.2 kg)     GEN: Pleasant, obese woman, in wheelchair, in no acute distress HEENT: Normal NECK: No JVD; No carotid bruits LYMPHATICS: No lymphadenopathy CARDIAC: RRR, no murmurs, rubs, gallops RESPIRATORY:  Clear to auscultation without rales, wheezing or rhonchi  ABDOMEN: Soft, non-tender, non-distended MUSCULOSKELETAL: Trace bilateral ankle edema; No deformity  SKIN: Warm and dry NEUROLOGIC:  Alert and oriented x 3 PSYCHIATRIC:  Normal affect   ASSESSMENT:    1. Aortic atherosclerosis ([place])   2. Mixed hyperlipidemia   3. Chronic heart failure with preserved ejection fraction (HCC)   4. Stage 3a chronic kidney disease ([place])   5. Morbid obesity ([place])    PLAN:    In order of problems listed above:  Patient is statin intolerant.  Treated with Praluent.  LDL cholesterol 106 mg/dL.  Considering her complex medical regimen with many medications.  I think it is best to keep her on Praluent alone at this point.  Also treated with aspirin 81 mg daily. As above, continue Praluent, patient statin intolerant. Echo reviewed as above.  Continue current diuretic therapy.  Blood pressure well controlled. With use of furosemide, spironolactone, and as needed metolazone, I am going to  update a metabolic panel today.  Last metabolic panel from January reviewed. Chronic issue, continues to work on lifestyle modification.  Complicated by her multiple medical problems. Patient appears stable from a cardiac perspective.  Okay to proceed with colonoscopy as planned without any cardiac testing before that procedure.  Patient will follow-up in 6 months here with an APP and I will see her back in 1 year.           Medication Adjustments/Labs and  Tests Ordered: Current medicines are reviewed at length with the patient today.  Concerns regarding medicines are outlined above.  Orders Placed This Encounter  Procedures   Basic metabolic panel   EKG [id number]   No orders of the defined types were placed in this encounter.   Patient Instructions  Medication Instructions:  Your physician recommends that you continue on your current medications as directed. Please refer to the Current Medication list given to you today.  *If you need a refill on your cardiac medications before your next appointment, please call your pharmacy*   Lab Work: BMET today If you have labs (blood work) drawn today and your tests are completely normal, you will receive your results only by: [place] (if you have MyChart) OR A paper copy in the mail If you have any lab test that is abnormal or we need to change your treatment, we will call you to review the results.   Testing/Procedures: NONE   Follow-Up: At [facility], you and your health needs are our priority.  As part of our continuing mission to provide you with exceptional heart care, we have created designated Provider Care Teams.  These Care Teams include your primary Cardiologist (physician) and Advanced Practice Providers (APPs -  Physician Assistants and Nurse Practitioners) who all work together to provide you with the care you need, when you need it.  Your next appointment:   6 month(s)  The format for your next appointment:   In Person  Provider:   Richardson Dopp, PA or Christen Bame, NP. Then, [name], MD will plan to see you in 1 year(s).   Important Information About Sugar         Signed, [name], MD  09/22/2021 12:24 PM    [place]

## 2021-09-24 ENCOUNTER — Telehealth: Payer: Self-pay

## 2021-09-24 DIAGNOSIS — Z79899 Other long term (current) drug therapy: Secondary | ICD-10-CM

## 2021-09-24 MED ORDER — FUROSEMIDE 20 MG PO TABS: 60.0000 mg | ORAL_TABLET | Freq: Every day | ORAL | 3 refills | Status: DC

## 2021-09-24 NOTE — Telephone Encounter (Signed)
Returned call to patient to inform of recommendations on furosemide adjustment. Pt understands that she is to take Furosemide 26m each morning only, with no evening dose. She will use additional furosemide if she notices increased fluid retention and will hold use of metolazone. Repeat BMET ordered at this time. [facility] and would like to coordinate these two things. As that's 2 months from now and pt has already been taking the QD dosing this week, we will plan to draw BMET at that appt, which should be enough time to verify kidney changes/creatinine level.

## 2021-09-24 NOTE — Telephone Encounter (Signed)
Thx. Would go to once daily on the furosemide and only take the second dose if needed. Repeat BMET 3 months.

## 2021-09-24 NOTE — Telephone Encounter (Signed)
-----   Message from [name], MD sent at 09/23/2021 10:03 PM EDT ----- Lab essentially stable. Would minimize use of zaroxalyn based on recent exam showing no evidence of volume overload and labs with modest increase in creatinine. thanks

## 2021-09-24 NOTE — Telephone Encounter (Signed)
Reached out to patient to review result note. Patient states she uses the Metolazone very minimally-estimates only 2-5 tablets per MONTH. She does however, take Furosemide 29m twice daily. She does admit that a "lot of days" she forgets to take the repeat dose, but definitely is consistent with the AM dosing (and PM dose if she remembers). Will route to MD for review d/t pt's elevated Creatinine.

## 2021-09-24 NOTE — Progress Notes (Signed)
Chronic Care Management Pharmacy Assistant   Name: Kathleen Hatfield  MRN: [id number] DOB: Apr 10, 1955   Reason for Encounter:Diabetes  Disease State   Recent office visits:  None since last CCM contact   Recent consult visits:  09/22/21-[name],MD(cardio)-SOB,EKG,labs,cleared for colonoscopy, stay on praluent ,f/u 6 months   Hospital visits:  None in previous 6 months  Medications: Outpatient Encounter Medications as of 09/24/2021  Medication Sig   acetaminophen (TYLENOL) 500 MG tablet Take 500 mg by mouth every 6 (six) hours as needed for moderate pain.   albuterol (VENTOLIN HFA) 108 (90 Base) MCG/ACT inhaler Inhale 2 puffs into the lungs every 6 (six) hours as needed.   Alirocumab (PRALUENT) 150 MG/ML SOAJ Inject 1 pen. into the skin every 14 (fourteen) days.   aspirin EC 81 MG tablet Take 1 tablet (81 mg total) by mouth daily. Swallow whole.   BAYER CONTOUR NEXT TEST test strip 1 each by Other route daily. As directed   budesonide (PULMICORT) 0.5 MG/2ML nebulizer solution Take 2 mLs (0.5 mg total) by nebulization 2 (two) times daily.   buPROPion (WELLBUTRIN XL) 300 MG 24 hr tablet TAKE ONE TABLET BY MOUTH EVERY MORNING   Cholecalciferol (VITAMIN D-3) 5000 UNITS TABS Take 5,000 Units by mouth every other day.    colchicine 0.6 MG tablet TAKE 1 TABLET (0.6 MG TOTAL) BY MOUTH DAILY AS NEEDED (FOR GOUT).   Continuous Blood Gluc Receiver ([place]) DEVI by Does not apply route.   Continuous Blood Gluc Sensor (DEXCOM G7 SENSOR) MISC by Does not apply route. Apply sensor every 10 days   dicyclomine (BENTYL) 20 MG tablet TAKE 1 TABLET UP TO FOUR TIMES A DAY FOR GI CRAMPING, PAIN, NAUSEA, AND VOMITING AS NEEDED   DULoxetine (CYMBALTA) 60 MG capsule TAKE 1 CAPSULE BY MOUTH ONCE DAILY.   fluticasone (FLONASE) 50 MCG/ACT nasal spray Place 2 sprays into both nostrils daily. (Patient taking differently: Place 2 sprays into both nostrils as needed.)   formoterol (PERFOROMIST) 20  MCG/2ML nebulizer solution Take 2 mLs (20 mcg total) by nebulization 2 (two) times daily.   furosemide (LASIX) 20 MG tablet TAKE THREE TABLETS BY MOUTH EVERY MORNING and TAKE THREE TABLETS BY MOUTH AT EVERY EVENING   gabapentin (NEURONTIN) 300 MG capsule Take 1 capsule (300 mg total) by mouth at bedtime.   GLOBAL EASE INJECT PEN NEEDLES 32G X 4 MM MISC USE AS DIRECTED WITH VICTOZA AND TRESIBA.   insulin degludec (TRESIBA FLEXTOUCH) 200 UNIT/ML FlexTouch Pen Inject 120 Units into the skin daily.   ipratropium-albuterol (DUONEB) 0.5-2.5 (3) MG/3ML SOLN Take 3 mLs by nebulization every 6 (six) hours as needed (wheezing).   Iron, Ferrous Sulfate, 325 (65 Fe) MG TABS Take 325 mg by mouth every other day.   metolazone (ZAROXOLYN) 5 MG tablet TAKE 1 TABLET BY MOUTH AS NEEDED.   MICROLET LANCETS MISC 1 each by Other route. As directed   nystatin (MYCOSTATIN/NYSTOP) powder Apply 1 application topically 3 (three) times daily. (Patient taking differently: Apply 1 application  topically as needed.)   ondansetron (ZOFRAN) 4 MG tablet TAKE ONE TABLET BY MOUTH every EIGHT hours AS NEEDED FOR NAUSEA AND VOMITING   oxyCODONE-acetaminophen (PERCOCET/ROXICET) 5-325 MG tablet Take 1 tablet by mouth every 6 (six) hours as needed for severe pain. (Patient not taking: Reported on 09/22/2021)   pantoprazole (PROTONIX) 40 MG tablet TAKE ONE TABLET BY MOUTH EVERY MORNING   polyethylene glycol (MIRALAX / GLYCOLAX) packet Take 17 g by mouth  daily as needed for mild constipation.   potassium chloride (MICRO-K) 10 MEQ CR capsule TAKE 5 CAPSULES (50 MEQ TOTAL) BY MOUTH 2 (TWO) TIMES DAILY   promethazine-dextromethorphan (PROMETHAZINE-DM) 6.25-15 MG/5ML syrup Take 2.5 mLs by mouth 4 (four) times daily as needed for cough (sedation caution).   revefenacin (YUPELRI) 175 MCG/3ML nebulizer solution Inhale one vial in nebulizer once daily. Do not mix with other nebulized medications.   rOPINIRole (REQUIP) 4 MG tablet TAKE 1 TABLET BY  MOUTH TWICE DAILY   spironolactone (ALDACTONE) 25 MG tablet TAKE ONE TABLET BY MOUTH ONCE DAILY   traMADol (ULTRAM) 50 MG tablet Take 1 tablet (50 mg total) by mouth 3 (three) times daily as needed.   TRULICITY 4.5 XB/2.8UX SOPN INJECT 4.5 MG ONCE WEEKLY   UNABLE TO FIND CPAP- At bedtime   No facility-administered encounter medications on file as of 09/24/2021.      Recent Relevant Labs: Lab Results  Component Value Date/Time   HGBA1C 9.0 (A) 05/14/2021 03:10 PM   HGBA1C 10.9 (H) 02/14/2021 06:59 AM   HGBA1C 9.4 (H) 11/28/2020 03:56 PM    Kidney Function Lab Results  Component Value Date/Time   CREATININE 1.47 (H) 09/22/2021 10:21 AM   CREATININE 1.24 (H) 03/31/2021 06:06 PM   CREATININE 1.16 09/04/2019 12:00 AM   CREATININE 1.3 04/26/2019 12:00 AM   GFR 48.76 (L) 02/27/2021 12:16 PM   GFRNONAA 48 (L) 03/31/2021 06:06 PM   GFRAA 69 01/19/2020 01:56 PM     Contacted patient on 09/26/21 to discuss diabetes disease state.   Current antihyperglycemic regimen:  Trulicity 4.5 mg weekly (Thurs)  Tresiba 120 units daily (midday)     Patient verbally confirms she is taking the above medications as directed. Yes  What diet changes have been made to improve diabetes control?  Patient reports a salt free,sugar free, gluten free diet.  What recent interventions/DTPs have been made to improve glycemic control:  Recommend to take [place] and Trulicity at bedtime to improve adherence Ordered Dexcom G7 to [facility] Patient received the shingrix  Have there been any recent hospitalizations or ED visits since last visit with CPP? No  Patient denies hypoglycemic symptoms, including Pale, Sweaty, Shaky, Hungry, Nervous/irritable, and Vision changes  Patient denies hyperglycemic symptoms, including blurry vision, excessive thirst, fatigue, polyuria, and weakness  How often are you checking your blood sugar? once daily  What are your blood sugars ranging?  Fasting: 09/26/21  130  Have  been ranging 130,136                  During the week, how often does your blood glucose drop below 70? Never  Are you checking your feet daily/regularly? Yes  Adherence Review: Is the patient currently on a STATIN medication? No Is the patient currently on ACE/ARB medication? No Does the patient have >5 day gap between last estimated fill dates? No  Care Gaps: Annual wellness visit in last year? Yes Most recent A1C reading:9.0  05/14/21 Most Recent BP reading:120/74  81-P 09/22/21  Last eye exam / retinopathy screening:2021 Last diabetic foot exam: UTD   Star Rating Drugs:  Medication:  Last Fill: Day Supply Tresiba   05/09/42  90 Trulicity 0.1UU  10/01/34 28  CCM appointment on 10/27/21 and Cardiology appointment on 11/25/21  [name], CPP notified  [name], [place]  [phone]

## 2021-09-26 ENCOUNTER — Ambulatory Visit: Payer: Medicare HMO | Admitting: Pharmacist

## 2021-09-26 ENCOUNTER — Other Ambulatory Visit: Payer: Self-pay | Admitting: Physician Assistant

## 2021-09-26 DIAGNOSIS — E1165 Type 2 diabetes mellitus with hyperglycemia: Secondary | ICD-10-CM

## 2021-09-26 DIAGNOSIS — I10 Essential (primary) hypertension: Secondary | ICD-10-CM

## 2021-09-26 DIAGNOSIS — I5032 Chronic diastolic (congestive) heart failure: Secondary | ICD-10-CM

## 2021-09-26 MED ORDER — POTASSIUM CHLORIDE ER 10 MEQ PO CPCR: 50.0000 meq | ORAL_CAPSULE | Freq: Two times a day (BID) | ORAL | 11 refills | Status: DC

## 2021-09-26 NOTE — Patient Instructions (Signed)
Visit Information  Phone number for Pharmacist: [phone]   Goals Addressed   None     Care Plan : [place]  Updates made by [name], RPH since 09/26/2021 12:00 AM     Problem: Hypertension, Hyperlipidemia, Diabetes, Heart Failure, COPD, Asthma, Chronic Kidney Disease, and Depression   Priority: High     Long-Range Goal: Disease Management   Start Date: 07/29/2020  Expected End Date: 05/24/2022  This Visit's Progress: On track  Recent Progress: On track  Priority: High  Note:   Current Barriers:  Unable to achieve control of diabetes  Unable to achieve control COPD, frequent exacerbations Does not maintain contact with specialists  Difficulty taking medications as prescribed  Pharmacist Clinical Goal(s):  Patient will contact provider office for questions/concerns as evidenced notation of same in electronic health record through collaboration with PharmD and provider.   Interventions: 1:1 collaboration with [name], MD regarding development and update of comprehensive plan of care as evidenced by provider attestation and co-signature Inter-disciplinary care team collaboration (see longitudinal plan of care) Comprehensive medication review performed; medication list updated in electronic medical record  Hyperlipidemia: (LDL goal < 70) -Uncontrolled - LDL 106 (08/2021), improved from 208 (06/2021) with Praluent; TRIG 312 likely related to uncontrolled DM -High ASCVD risk (25%) -Current treatment: Praluent 150 mg q 14 days - Appropriate, Query Effective (not started yet) -Medications previously tried: Multiple statins (atorvastatin, others) - leg weakness; Vascepa (cost) -Educated on Cholesterol goals; benefits of Prauluent -Recommend to continue current medication; follow up with cardiology for lab work as scheduled  Diabetes (A1c goal <7%) -Uncontrolled, improving- A1c 9.0 (05/2021), pt endorses improvement in BG on higher dose of  insulin and Trulicity.  -Follows with Dr [name] (established care 05/2021). Dx 2017, insulin since 2020. Hx of chronic UTIs, would avoid SGLT2-inhibitor -Fasting BG: 150-160; Denies hypoglycemia < 70 -Current medications: Trulicity 4.5 mg weekly (Thurs) - Appropriate, Query Effective Tresiba 120 units daily (midday) -Appropriate, Query Effective -Medications previously tried: Jardiance, Victoza, metformin -Current meal patterns: The patient reports she is on a salt free, sugar free, gluten free diet but had a hard time sticking to this. She tries to limit portions.  -Completed Dexcom training today, see summary -Recommend to continue current medication  Heart Failure (Goal: manage symptoms and prevent exacerbations) -Controlled - per pt report of symptoms. She has not been checking her weight regularly; pt has previously had trouble swallowing large tablets but does well with Potassium 10 mEq capsules.  -Followed by Dr. [name] with [facility] -Last ejection fraction: 70% (Date: 03/21) -HF type: Diastolic; NYHA Class: II (slight limitation of activity) -Current treatment: Spironolactone 25 mg daily AM - Appropriate, Effective, Safe, Accessible Furosemide 20 mg - 3 tab BID- Appropriate, Effective, Safe, Accessible Potassium 10 mEq - 4 AM, 2 PM, 4 HS - Appropriate, Effective, Safe, Accessible Metolazone 2.5 mg PRN wt gain - Appropriate, Effective, Safe, Accessible -Medications previously tried: torsemide, bumetanide, losartan  -Recommended to continue current medication;  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) -Improving - pt has had multiple hospitalizations for COPD exacerbation in 2022; she is now taking her nebulizers as directed and reports doing better overall, using Duoneb and albuterol rescue less often; she wears Oxygen at night and PRN during the day;  -Followed by Dr Halford Chessman, [facility]. Pt does not follow up with pulmonology as  frequently as recommended.  -Pulmonary function testing: 2021 FEV1 42% predicted -Gold Grade: Gold  3 (FEV1 30-49%) -Current COPD Classification:  E (high sx, >2 exacerbations/yr) -Exacerbations requiring treatment in last 6 months: yes, multiple -Compliant with CPAP nightly -Current regimen: Pulmicort nebuilzer BID - Appropriate, Effective, Safe, Accessible Yupelri nebulizer daily -Appropriate, Effective, Safe, Accessible Albuterol/ipratropium nebulizer - takes 2-3 times per day as needed - Appropriate, Effective, Safe, Accessible Albuterol inhaler - 2 puffs every 6 hours PRN - Appropriate, Effective, Safe, Accessible -Medications previously tried: per pt inhalers less effective than nebulizers, Perforomist was unaffordable; She was previously on Advair, Incruse and most recently Trelegy but says she has stopped these because she does not like the powder inhalers. -Recommended to continue current medication; follow up with pulmonology more regularly  Depression/Anxiety/Pain(Goal: Control symptoms) -Controlled, stable per patient; she asked about changing gabapentin to 300 mg dose due to pill burden -Current treatment: Bupropion 300 mg daily - Appropriate, Effective, Safe, Accessible Cymbalta 60 mg daily - Appropriate, Effective, Safe, Accessible Gabapentin 100 mg - 3 caps HS - Appropriate, Effective, Safe, Accessible Ropinirole 4 mg BID (RLS) - Appropriate, Effective, Query Safe -Medications previously tried/failed: none -Concern that ropinirole may be causing her to fall asleep suddenly during the day. This has been discussed with patient before and per chart records, she was unable to tolerate dose reductions. We will revisit this issue periodically -Recommend to continue current medication  Medication management (Goal: improve adherence) -Updated pill packs per patient preference to improve experience and adherence PACKS: Breakfast (10-11 AM) 1 Bupropion 300 mg XR  1 Vitamin D3 5000  IU - every other day  1 Ropinirole 4 mg 1 Pantoprazole 40 mg  1 Spironolactone 25 mg  3 furosemide 20 mg   Mid- day (2-3 PM) 10 potassium 10 mEq (5 at a time, separated by 1-2 hrs)  Evening Meal (5-6 pm) 1 Ferrous sulfate QOD 325 mg   Bedtime (10 PM) 1 gabapentin 300 mg  1 Duloxetine 60 mg  1 ropinirole 4 mg  1 cetirizine 10 mg  Other /VIALS: Injections: -Tresiba daily (filled 30 day supply 11/05/91) - [facility] -Trulicity 4.5 mg on Thurs or Friday (filled 30 day supply 04/07/21) - [facility] -Praluent 150 mg q 2 weeks - [facility]  As needed:  -Dicyclomine - uses as needed for flares -Tylenol - 1 before bedtime -Meloxicam 15 mg - 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 Active MyChart status and patient understanding of how to access instructions and care plan via MyChart confirmed with patient.    Telephone follow up appointment with pharmacy team member scheduled for: 1 month  [name], PharmD, [facility] Clinical Pharmacist [facility] [phone]

## 2021-09-26 NOTE — Progress Notes (Signed)
1  Chronic Care Management Pharmacy Note  04/07/2021 Name:  Kathleen Hatfield MRN:  [id number] DOB:  01/24/1956  Summary: CCM F/U visit Patient presented for [facility] G7 training.  Demonstrated with teach-back: -components of sensor and how to prepare sensor for application -how to apply sensor to arm. Patient successfully applied sensor to R arm today -how to navigate Reader device in order to pair sensor; sensor was successfully paired today -how to adjust high/low sugar alarms. Low alarm set to 70 and high alarm set to 300 today.  Educated patient on: -viewing reader at any time to view real-time sugar levels -difference between blood sugar and sensor (interstitial fluid) sugar levels -changing sensor every 10 days or as initiated by app/reader -removing sensor prior to MRI, CT scans -if sugar readings ever seem wrong/questionable, check sugar with a finger stick -Dexcom Clarity cloud monitoring: Patient did not enroll in Clarity  Provided customer service line for Dexcom in case of sensor breakdowns: [phone]   Pt voiced understanding of above and denies further questions.  Plan: -Globe will call patient monthly for medication coordination -Pharmacist follow up televisit scheduled for 1 months    Subjective: Kathleen Hatfield is an 66 y.o. year old female who is a primary patient of [name], [name], MD.  The CCM team was consulted for assistance with disease management and care coordination needs.    Engaged with patient by telephone for follow up visit in response to provider referral for pharmacy case management and/or care coordination services.   Consent to Services:  The patient was given information about Chronic Care Management services, agreed to services, and gave verbal consent prior to initiation of services.  Please see initial visit note for detailed documentation.   Patient Care Team: [name], MD as PCP - General (Family Medicine) [name], MD as PCP - Cardiology (Cardiology) [name], MD as Consulting Physician (Pulmonary Disease) [name], MD as Consulting Physician (General Surgery) [place], [name], MD (Inactive) as Consulting Physician (Pulmonary Disease) [name], [facility] as Pharmacist (Pharmacist) [name] as Physician Assistant (Cardiology)  Recent office visits:  03/11/21 - [name], MD, PCP - Pt presented for diabetes follow up. Increase insulin by 2 units/day for BP > 150. If you have more ankle swelling take 1 dose of metolazone.  Update me about your breathing and sugar next week.  02/27/21 - [name], MD, PCP - Pt presented for hospital follow up. Marland Kitchen  She was admitted with flu with hyperglycemia exacerbated by steroid use.  Improved in the meantime.  Discussed taking 1 dose of metolazone to see if that helped with her breathing and cough in the meantime.  She can use Tessalon for cough. 02/07/21 - Alma Friendly, NP - Pt presented for COPD exacerbation. Start doxycycline x 10 days and prednisone 40 mg x 5 days. Adjust insulin by 10 units for BG > 200.   11/28/20 - [name], MD, PCP - Pt presented for diabetes follow up and CHF. Stop meloxicam. Updated labs. A1c elevated, Referral to endocrinology. BNP normal, hold metolazone. Low iron, start supplementation.    Recent consult visits:  09/03/21 Cardiology TE - discuss lipid results on Praluent. Not at goal yet, pt declines additional med. Recheck lipids 6 weeks.  08/27/21-[name],MD (Pulmoanry): f/u sleep apnea-restart perforomist-Use yupelri in your nebulizer daily -previously seen by Dr. Wells Guiles Tat with [facility]; advised her to contact neurology if her tremor progressf/u  4 months  07/02/21 [place] PharmD (Lipid clinic): LDL 208. Start Praluent 150 mg q14d.  06/04/21-[name],PA(Cardiology)-F/U CHF-Recent creatinine stable. Arrange fasting Lipids(abnormal)  Refer to PharmD [facility].  Start ASA 81 mg once daily.  05/14/21 Dr [name] (Endocrine):  Establish care DM. Advised mealtime insulin, pt declined. Increased Tresiba to 120 units.   Hospital visits: 03/31/21 - ED visit, atypical chest pain 02/14/21 - 02/18/21 - Admission, COPD exacerbation  02/08/21 - ED, COPD exacerbation 12/12/20 - ED visit, acute gout  Objective:  Lab Results  Component Value Date   CREATININE 1.47 (H) 09/22/2021   BUN 27 09/22/2021   GFR 48.76 (L) 02/27/2021   GFRNONAA 48 (L) 03/31/2021   GFRAA 69 01/19/2020   NA 140 09/22/2021   K 4.5 09/22/2021   CALCIUM 10.5 (H) 09/22/2021   CO2 29 09/22/2021   GLUCOSE 163 (H) 09/22/2021    Lab Results  Component Value Date/Time   HGBA1C 9.0 (A) 05/14/2021 03:10 PM   HGBA1C 10.9 (H) 02/14/2021 06:59 AM   HGBA1C 9.4 (H) 11/28/2020 03:56 PM   GFR 48.76 (L) 02/27/2021 12:16 PM   GFR 38.07 (L) 11/28/2020 03:56 PM    Last diabetic Eye exam:  Lab Results  Component Value Date/Time   HMDIABEYEEXA No Retinopathy 12/22/2019 12:00 AM    Last diabetic Foot exam: completed by PCP 11/28/2020 - normal    Lab Results  Component Value Date   CHOL 202 (H) 09/02/2021   HDL 43 09/02/2021   LDLCALC 106 (H) 09/02/2021   TRIG 312 (H) 09/02/2021   CHOLHDL 4.7 (H) 09/02/2021       Latest Ref Rng & Units 09/02/2021    8:50 AM 03/31/2021    8:03 PM 11/28/2020    3:56 PM  Hepatic Function  Total Protein 6.0 - 8.5 g/dL 7.3  7.3  7.0   Albumin 3.8 - 4.8 g/dL 4.6  3.5  3.8   AST 0 - 40 IU/L 23  24  18    ALT 0 - 32 IU/L 27  16  15    Alk Phosphatase 44 - 121 IU/L 113  88  92   Total Bilirubin 0.0 - 1.2 mg/dL 0.5  1.4  0.3   Bilirubin, Direct 0.00 - 0.40 mg/dL 0.15  0.4      Lab Results  Component Value Date/Time   TSH 2.850 06/09/2019 12:50 PM   TSH 2.31 04/15/2015 03:45 PM       Latest Ref Rng & Units 03/31/2021    6:06 PM 02/27/2021   12:16 PM 02/16/2021   12:40 AM  CBC  WBC 4.0 - 10.5 K/uL 9.7  10.6  16.1   Hemoglobin 12.0 - 15.0 g/dL 13.6  13.6   13.7   Hematocrit 36.0 - 46.0 % 41.6  41.9  42.3   Platelets 150 - 400 K/uL 328  360.0  259     Lab Results  Component Value Date/Time   VD25OH 45.52 06/18/2020 04:52 PM   VD25OH 47.60 06/22/2019 01:59 PM    Clinical ASCVD: No  The 10-year ASCVD risk score (Arnett DK, et al., 2019) is: 15%   Values used to calculate the score:     Age: 66 years     Sex: Female     Is Non-Hispanic African American: No     Diabetic: Yes     Tobacco smoker: No     Systolic Blood Pressure: 073 mmHg     Is BP treated: Yes     HDL  Cholesterol: 43 mg/dL     Total Cholesterol: 202 mg/dL       07/10/2021   11:14 AM 08/12/2020    1:54 PM 12/28/2017    2:09 PM  Depression screen PHQ 2/9  Decreased Interest 0 3 1  Down, Depressed, Hopeless 1 1 1   PHQ - 2 Score 1 4 2   Altered sleeping 2 2 3   Tired, decreased energy 1 3 3   Change in appetite 0 2 0  Feeling bad or failure about yourself  0 3 3  Trouble concentrating 0 3 0  Moving slowly or fidgety/restless 0 3 3  Suicidal thoughts 0 0 0  PHQ-9 Score 4 20 14   Difficult doing work/chores Not difficult at all Somewhat difficult Very difficult    Social History   Tobacco Use  Smoking Status Never  Smokeless Tobacco Never   BP Readings from Last 3 Encounters:  09/22/21 120/74  08/27/21 118/70  06/04/21 108/60   Pulse Readings from Last 3 Encounters:  09/22/21 81  08/27/21 66  06/04/21 70   Wt Readings from Last 3 Encounters:  09/22/21 257 lb (116.6 kg)  08/27/21 260 lb 9.6 oz (118.2 kg)  06/04/21 256 lb 3.2 oz (116.2 kg)   BMI Readings from Last 3 Encounters:  09/22/21 42.77 kg/m  08/27/21 43.37 kg/m  06/04/21 42.63 kg/m   Iron/TIBC/Ferritin/ %Sat    Component Value Date/Time   IRON 37 (L) 11/28/2020 1556   IRON 66 09/19/2019 0000   TIBC 430 09/19/2019 0000   FERRITIN 64.4 11/28/2020 1556   FERRITIN 50 09/19/2019 0000   IRONPCTSAT 15 09/19/2019 0000    Assessment/Interventions: Review of patient past medical history,  allergies, medications, health status, including review of consultants reports, laboratory and other test data, was performed as part of comprehensive evaluation and provision of chronic care management services.   SDOH:  (Social Determinants of Health) assessments and interventions performed: Yes  SDOH Screenings   Alcohol Screen: Low Risk  (07/10/2021)   Alcohol Screen    Last Alcohol Screening Score (AUDIT): 0  Depression (PHQ2-9): Low Risk  (07/10/2021)   Depression (PHQ2-9)    PHQ-2 Score: 4  Financial Resource Strain: Low Risk  (07/10/2021)   Overall Financial Resource Strain (CARDIA)    Difficulty of Paying Living Expenses: Not hard at all  Food Insecurity: No Food Insecurity (07/10/2021)   Hunger Vital Sign    Worried About Running Out of Food in the Last Year: Never true    Ran Out of Food in the Last Year: Never true  Housing: Low Risk  (07/10/2021)   Housing    Last Housing Risk Score: 0  Physical Activity: Inactive (07/10/2021)   Exercise Vital Sign    Days of Exercise per Week: 0 days    Minutes of Exercise per Session: 0 min  Social Connections: Moderately Isolated (07/10/2021)   Social Connection and Isolation Panel [NHANES]    Frequency of Communication with Friends and Family: More than three times a week    Frequency of Social Gatherings with Friends and Family: Once a week    Attends Religious Services: Never    Marine scientist or Organizations: No    Attends Archivist Meetings: Never    Marital Status: Living with partner  Stress: No Stress Concern Present (07/10/2021)   [place]    Feeling of Stress : Not at all  Tobacco Use: Low Risk  (09/22/2021)   Patient  History    Smoking Tobacco Use: Never    Smokeless Tobacco Use: Never    Passive Exposure: Not on file  Transportation Needs: No Transportation Needs (07/10/2021)   PRAPARE - Transportation    Lack of Transportation (Medical): No     Lack of Transportation (Non-Medical): No    CCM Care Plan  Allergies  Allergen Reactions   Almond Oil Anaphylaxis, Shortness Of Breath and Swelling   Morphine And Related Shortness Of Breath and Other (See Comments)    Pt. States while in the hospital it affected her breathing, O2 dropped to the 70's   Atorvastatin Other (See Comments)    Leg weakness   Ceclor [Cefaclor] Nausea And Vomiting   Sulfa Antibiotics Nausea And Vomiting   Ciprofloxacin Other (See Comments)    Makes joints and muscles ache   Levaquin [Levofloxacin] Other (See Comments)    Body aches   Losartan Other (See Comments)    Weakness    Statins Other (See Comments)    Leg and body weakness    Medications Reviewed Today     Reviewed by [name], MD (Physician) on 09/22/21 at 1000  Med List Status: <None>   Medication Order Taking? Sig Documenting Provider Last Dose Status Informant  acetaminophen (TYLENOL) 500 MG tablet [id number] Yes Take 500 mg by mouth every 6 (six) hours as needed for moderate pain. [provider] Taking Active Self  albuterol (VENTOLIN HFA) 108 (90 Base) MCG/ACT inhaler [id number] Yes Inhale 2 puffs into the lungs every 6 (six) hours as needed. [name], MD Taking Active   Alirocumab (PRALUENT) 150 MG/ML Darden Palmer [id number] Yes Inject 1 pen. into the skin every 14 (fourteen) days. Supple, Harlon Flor, RPH-CPP Taking Active   aspirin EC 81 MG tablet [id number] Yes Take 1 tablet (81 mg total) by mouth daily. Swallow whole. Richardson Dopp T, PA-C Taking Active   BAYER CONTOUR NEXT TEST test strip [id number] Yes 1 each by Other route daily. As directed [provider] Taking Active Self           Med Note Tamala Julian, JEFFREY W   Mon May 03, 2017 11:02 PM)    budesonide (PULMICORT) 0.5 MG/2ML nebulizer solution [id number] Yes Take 2 mLs (0.5 mg total) by nebulization 2 (two) times daily. [name], MD Taking Active Self  buPROPion (WELLBUTRIN XL) 300 MG 24 hr tablet [id number]  Yes TAKE ONE TABLET BY MOUTH EVERY MORNING [name], MD Taking Active   Cholecalciferol (VITAMIN D-3) 5000 UNITS TABS [id number] Yes Take 5,000 Units by mouth every other day.  [provider] Taking Active Self  colchicine 0.6 MG tablet [id number] Yes TAKE 1 TABLET (0.6 MG TOTAL) BY MOUTH DAILY AS NEEDED (FOR GOUT). [name], MD Taking Active   Continuous Blood Gluc Receiver ([place]) [place] [id number] Yes by Does not apply route. [provider] Taking Active   Continuous Blood Gluc Sensor ([place]) [place] [id number] Yes by Does not apply route. Apply sensor every 10 days [provider] Taking Active   dicyclomine (BENTYL) 20 MG tablet [id number] Yes TAKE 1 TABLET UP TO FOUR TIMES A DAY FOR GI CRAMPING, PAIN, NAUSEA, AND VOMITING AS NEEDED [name], MD Taking Active   DULoxetine (CYMBALTA) 60 MG capsule [id number] Yes TAKE 1 CAPSULE BY MOUTH ONCE DAILY. [name], MD Taking Active   fluticasone [facility]) 50 MCG/ACT nasal spray [id number] Yes Place 2 sprays into both nostrils daily.  Patient taking differently:  Place 2 sprays into both nostrils as needed.   Evlyn Courier, PA-C Taking Active Self  formoterol (PERFOROMIST) 20 MCG/2ML nebulizer solution [id number] Yes Take 2 mLs (20 mcg total) by nebulization 2 (two) times daily. [name], MD Taking Active   furosemide (LASIX) 20 MG tablet [id number] Yes TAKE THREE TABLETS BY MOUTH EVERY MORNING and TAKE THREE TABLETS BY MOUTH AT EVERY EVENING Richardson Dopp T, PA-C Taking Active   gabapentin (NEURONTIN) 300 MG capsule [id number] Yes Take 1 capsule (300 mg total) by mouth at bedtime. [name], MD Taking Active   GLOBAL EASE INJECT PEN NEEDLES 32G X 4 MM MISC [id number] Yes USE AS DIRECTED WITH VICTOZA AND TRESIBA. [name], MD Taking Active Self  insulin degludec (TRESIBA FLEXTOUCH) 200 UNIT/ML FlexTouch Pen [id number] Yes Inject 120 Units into the skin daily. [provider] Taking Active   ipratropium-albuterol (DUONEB) 0.5-2.5 (3) MG/3ML SOLN [id number] Yes Take 3 mLs by nebulization every 6 (six) hours as needed (wheezing). [name], MD Taking Active Self  Iron, Ferrous Sulfate, 325 (65 Fe) MG TABS [id number] Yes Take 325 mg by mouth every other day. [name], MD Taking Active            Med Note Briant Cedar   Wed Jun 04, 2021  2:59 PM)    metolazone (ZAROXOLYN) 5 MG tablet [id number] Yes TAKE 1 TABLET BY MOUTH AS NEEDED. [name], MD Taking Active   MICROLET LANCETS [place] [id number] Yes 1 each by Other route. As directed [provider] Taking Active Self           Med Note Mickie Bail Oct 21, 2015  3:31 PM)    nystatin (MYCOSTATIN/NYSTOP) powder [id number] Yes Apply 1 application topically 3 (three) times daily.  Patient taking differently: Apply 1 application  topically as needed.   [name], MD Taking Active Self  ondansetron (ZOFRAN) 4 MG tablet [id number] Yes TAKE ONE TABLET BY MOUTH every EIGHT hours AS NEEDED FOR NAUSEA AND VOMITING [name], MD Taking Active   oxyCODONE-acetaminophen (PERCOCET/ROXICET) 5-325 MG tablet [id number] No Take 1 tablet by mouth every 6 (six) hours as needed for severe pain.  Patient not taking: Reported on 09/22/2021   Blanchie Dessert, MD Not Taking Active Self  pantoprazole (PROTONIX) 40 MG tablet [id number] Yes TAKE ONE TABLET BY MOUTH EVERY MORNING [name], MD Taking Active   polyethylene glycol [facility] / GLYCOLAX) packet [id number] Yes Take 17 g by mouth daily as needed for mild constipation. [provider] Taking Active Self  potassium chloride (MICRO-K) 10 MEQ CR capsule [id number] Yes TAKE 5 CAPSULES (50 MEQ TOTAL) BY MOUTH 2 (TWO) TIMES DAILY Richardson Dopp T, PA-C Taking Active Self  promethazine-dextromethorphan (PROMETHAZINE-DM) 6.25-15 MG/5ML syrup [id number] Yes Take 2.5 mLs by mouth 4 (four) times daily as needed for cough (sedation  caution). [name], MD Taking Active   revefenacin [facility]) 175 MCG/3ML nebulizer solution [id number] Yes Inhale one vial in nebulizer once daily. Do not mix with other nebulized medications. [name], MD Taking Active   rOPINIRole (REQUIP) 4 MG tablet [id number] Yes TAKE 1 TABLET BY MOUTH TWICE DAILY [name], MD Taking Active   spironolactone (ALDACTONE) 25 MG tablet [id number] Yes TAKE ONE TABLET BY MOUTH ONCE DAILY [name], MD Taking Active   traMADol (ULTRAM) 50 MG tablet [id number] Yes Take 1 tablet (50 mg total) by mouth 3 (three) times daily as needed.  [name], MD Taking Active Self  TRULICITY 4.5 EH/6.3JS SOPN [id number] Yes INJECT 4.5 MG ONCE WEEKLY [name], MD Taking Active   UNABLE TO FIND [id number] Yes CPAP- At bedtime [provider] Taking Active Self            Patient Active Problem List   Diagnosis Date Noted   Aortic atherosclerosis ([place]) 06/04/2021   Preoperative cardiovascular examination 06/04/2021   Hyperglycemia 02/14/2021   Rash 09/27/2020   COPD exacerbation ([place]) 09/21/2020   Acute exacerbation of COPD with asthma ([place]) 09/19/2020   Mixed diabetic hyperlipidemia associated with type 2 diabetes mellitus ([place]) 09/19/2020   Pain of foot 06/20/2020   Medicare annual wellness visit, initial 06/20/2020   Constipation 06/11/2020   Diverticulosis of colon 06/11/2020   Irritable bowel syndrome 28/78/6767   Periumbilical pain 20/94/7096   RLS (restless legs syndrome)    Asthma exacerbation 01/08/2020   Muscle weakness 01/08/2020   Grade I diastolic dysfunction 28/36/6294   Drug-induced myopathy 10/10/2019   Advance care planning 06/14/2019   RLQ abdominal pain 06/14/2019   COPD (chronic obstructive pulmonary disease) ([place]) 06/14/2019   Leukocytosis 06/14/2019   Hypercalcemia 06/14/2019   Gout 06/14/2019   Type 2 diabetes mellitus with hyperglycemia, without long-term current use of insulin ([place])    COPD with  acute exacerbation ([place]) 05/03/2017   Diabetes mellitus without complication (HCC)    CKD (chronic kidney disease) stage 3, GFR 30-59 ml/min (HCC)    Hypersomnia with sleep apnea 09/16/2015   Fatigue 06/07/2015   Morbid obesity ([place]) 06/07/2015   (HFpEF) heart failure with preserved ejection fraction (HCC)    Pneumonia of both lower lobes due to infectious organism 02/09/2013   OSA on CPAP 08/23/2012   Essential hypertension    Depression    GERD without esophagitis    Hyperlipidemia    Ulcerative colitis ([place])    Patellar tendinitis 05/25/2011   Knee pain 05/25/2011   Myofascial pain 05/25/2011   Cervicalgia 05/25/2011    Immunization History  Administered Date(s) Administered   Influenza Split 12/01/2012, 01/18/2017   Influenza Whole 12/25/2011   Influenza,inj,Quad PF,6+ Mos 11/27/2013, 12/08/2014, 12/11/2015   Influenza-Unspecified 12/07/2017   Janssen (J&J) SARS-COV-2 Vaccination 06/15/2019   Pneumococcal Conjugate-13 02/17/2017   Pneumococcal Polysaccharide-23 11/27/2013   Tdap 04/01/2010    Conditions to be addressed/monitored:  Hypertension, Hyperlipidemia, Diabetes, Heart Failure, COPD, Asthma, Chronic Kidney Disease, and Depression  Care Plan : CCM Pharmacy Care Plan  Updates made by [name], [place] since 09/26/2021 12:00 AM     Problem: Hypertension, Hyperlipidemia, Diabetes, Heart Failure, COPD, Asthma, Chronic Kidney Disease, and Depression   Priority: High     Long-Range Goal: Disease Management   Start Date: 07/29/2020  Expected End Date: 05/24/2022  This Visit's Progress: On track  Recent Progress: On track  Priority: High  Note:   Current Barriers:  Unable to achieve control of diabetes  Unable to achieve control COPD, frequent exacerbations Does not maintain contact with specialists  Difficulty taking medications as prescribed  Pharmacist Clinical Goal(s):  Patient will contact provider office for questions/concerns as evidenced  notation of same in electronic health record through collaboration with PharmD and provider.   Interventions: 1:1 collaboration with [name], MD regarding development and update of comprehensive plan of care as evidenced by provider attestation and co-signature Inter-disciplinary care team collaboration (see longitudinal plan of care) Comprehensive medication review performed; medication list updated in electronic medical record  Hyperlipidemia: (LDL goal <  70) -Uncontrolled - LDL 106 (08/2021), improved from 208 (06/2021) with Praluent; TRIG 312 likely related to uncontrolled DM -High ASCVD risk (25%) -Current treatment: Praluent 150 mg q 14 days - Appropriate, Query Effective (not started yet) -Medications previously tried: Multiple statins (atorvastatin, others) - leg weakness; Vascepa (cost) -Educated on Cholesterol goals; benefits of Prauluent -Recommend to continue current medication; follow up with cardiology for lab work as scheduled  Diabetes (A1c goal <7%) -Uncontrolled, improving- A1c 9.0 (05/2021), pt endorses improvement in BG on higher dose of insulin and Trulicity.  -Follows with Dr [name] (established care 05/2021). Dx 2017, insulin since 2020. Hx of chronic UTIs, would avoid SGLT2-inhibitor -Fasting BG: 150-160; Denies hypoglycemia < 70 -Current medications: Trulicity 4.5 mg weekly (Thurs) - Appropriate, Query Effective Tresiba 120 units daily (midday) -Appropriate, Query Effective -Medications previously tried: Jardiance, Victoza, metformin -Current meal patterns: The patient reports she is on a salt free, sugar free, gluten free diet but had a hard time sticking to this. She tries to limit portions.  -Completed Dexcom training today, see summary -Recommend to continue current medication  Heart Failure (Goal: manage symptoms and prevent exacerbations) -Controlled - per pt report of symptoms. She has not been checking her weight regularly; pt has previously had  trouble swallowing large tablets but does well with Potassium 10 mEq capsules.  -Followed by Dr. [name] with [facility] -Last ejection fraction: 70% (Date: 03/21) -HF type: Diastolic; NYHA Class: II (slight limitation of activity) -Current treatment: Spironolactone 25 mg daily AM - Appropriate, Effective, Safe, Accessible Furosemide 20 mg - 3 tab BID- Appropriate, Effective, Safe, Accessible Potassium 10 mEq - 4 AM, 2 PM, 4 HS - Appropriate, Effective, Safe, Accessible Metolazone 2.5 mg PRN wt gain - Appropriate, Effective, Safe, Accessible -Medications previously tried: torsemide, bumetanide, losartan  -Recommended to continue current medication;  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) -Improving - pt has had multiple hospitalizations for COPD exacerbation in 2022; she is now taking her nebulizers as directed and reports doing better overall, using Duoneb and albuterol rescue less often; she wears Oxygen at night and PRN during the day;  -Followed by Dr Halford Chessman, [facility]. Pt does not follow up with pulmonology as frequently as recommended.  -Pulmonary function testing: 2021 FEV1 42% predicted -Gold Grade: Gold 3 (FEV1 30-49%) -Current COPD Classification:  E (high sx, >2 exacerbations/yr) -Exacerbations requiring treatment in last 6 months: yes, multiple -Compliant with CPAP nightly -Current regimen: Pulmicort nebuilzer BID - Appropriate, Effective, Safe, Accessible Yupelri nebulizer daily -Appropriate, Effective, Safe, Accessible Albuterol/ipratropium nebulizer - takes 2-3 times per day as needed - Appropriate, Effective, Safe, Accessible Albuterol inhaler - 2 puffs every 6 hours PRN - Appropriate, Effective, Safe, Accessible -Medications previously tried: per pt inhalers less effective than nebulizers, Perforomist was unaffordable; She was previously on Advair, Incruse and most recently Trelegy but says she has stopped these because  she does not like the powder inhalers. -Recommended to continue current medication; follow up with pulmonology more regularly  Depression/Anxiety/Pain(Goal: Control symptoms) -Controlled, stable per patient; she asked about changing gabapentin to 300 mg dose due to pill burden -Current treatment: Bupropion 300 mg daily - Appropriate, Effective, Safe, Accessible Cymbalta 60 mg daily - Appropriate, Effective, Safe, Accessible Gabapentin 100 mg - 3 caps HS - Appropriate, Effective, Safe, Accessible Ropinirole 4 mg BID (RLS) - Appropriate, Effective, Query Safe -Medications previously tried/failed: none -Concern that ropinirole may be causing her to fall asleep suddenly during the day. This has  been discussed with patient before and per chart records, she was unable to tolerate dose reductions. We will revisit this issue periodically -Recommend to continue current medication  Medication management (Goal: improve adherence) -Updated pill packs per patient preference to improve experience and adherence PACKS: Breakfast (10-11 AM) 1 Bupropion 300 mg XR  1 Vitamin D3 5000 IU - every other day  1 Ropinirole 4 mg 1 Pantoprazole 40 mg  1 Spironolactone 25 mg  3 furosemide 20 mg   Mid- day (2-3 PM) 10 potassium 10 mEq (5 at a time, separated by 1-2 hrs)  Evening Meal (5-6 pm) 1 Ferrous sulfate QOD 325 mg   Bedtime (10 PM) 1 gabapentin 300 mg  1 Duloxetine 60 mg  1 ropinirole 4 mg  1 cetirizine 10 mg  Other /VIALS: Injections: -Tresiba daily (filled 30 day supply 08/26/33) [facility] -Trulicity 4.5 mg on Thurs or Friday (filled 30 day supply 04/07/21) [facility] -Praluent 150 mg q 2 weeks [facility]  As needed:  -Dicyclomine - uses as needed for flares -Tylenol - 1 before bedtime -Meloxicam 15 mg - short term for arthritis in ankle -Metolazone - for fluid overload  -Tramadol - PRN diverticulitis -Colchicine 0.6 mg PRN  Patient Goals/Self-Care Activities Patient will:  - take medications as  prescribed as evidenced by patient report and record review focus on medication adherence by PILL PACKS check glucose daily, document, and provide at future appointments check blood pressure daily, document, and provide at future appointments      Medication Assistance: None required.  Patient affirms current coverage meets needs.  Medication Access: Within the past 30 days, how often has patient missed a dose of medication? 0 Is a pillbox or other method used to improve adherence? Yes  Factors that may affect medication adherence? no barriers identified Are meds synced by current pharmacy? Yes  Are meds delivered by current pharmacy? Yes  Does patient experience delays in picking up medications due to transportation concerns? No   [facility] Reviewed: Is patient disadvantaged to use [facility]?: No  Current Rx insurance plan: [employer] Name and location of Current pharmacy:  [facility] - [place], [place] - [address] [place] [address] Phone: [phone] Fax: [phone]  [id number] [place]: [phone]: [phone] [facility] services reviewed with patient today?: Yes  30-day packs (last delivery 09/10/21): Bupropion HCL XL 3029m take 1 tablet at evening meal Cholecalciferol (vit D3)-5000units- take 1 tablet breakfast every other day  Ropinirole 55m71mtake 1 tablet breakfast 1 tablet bedtime  Pantoprazole 46m49make 1 tablet bedtime Spironolactone 25mg79mke 1 tablet breakfast Furosemide 20mg 61m 3 tablets breakfast 3 tablet lunchtime Potassium chloride 10 meq ER- take 10 tablets at lunchtime Duloxetine 60mg- 55m 1 tablet breakfast Ferrous Sulfate 325mg- t53m1 tablet evening meal every other day  Gabapentin 300mg-tak26mtablet bedtime Cetirizine 10mg- tak61mtablet daily as needed Aspirin 81mg  -tak35mtablet at breakfast Trulicity and [facility]  Care Plan and Follow Up Patient Decision:  Patient agrees to Care Plan and Follow-up.  Follow Up Plan: Telephone follow up appointment with care management team member scheduled for: 1 months  Mann Skaggs FolCharlene BrookeCACP Clinical Pharmacist [place]wight[facility] at [phone]

## 2021-09-30 ENCOUNTER — Telehealth: Payer: Self-pay

## 2021-09-30 NOTE — Chronic Care Management (AMB) (Signed)
Chronic Care Management Pharmacy Assistant   Name: Kathleen Hatfield  MRN: [id number] DOB: 08-10-1955  Reason for Encounter: Medication Adherence and Delivery Coordination     Recent office visits:  09/16/21-Kathleen Green,RN(fam med)-Telemedicine,Patient closed to nursing case management services due to goals being met.   Recent consult visits:  09/22/21-Kathleen Cooper,MD(cardio)-f/u SOB,EKG,labs,clearance for colonoscopy, f/u 6 months  09/03/21-Kathleen Hatfield,RPH,CPP-phone call lab results, no medication changes,repeat 6 weeks   Hospital visits:  None in previous 6 months  Medications: Outpatient Encounter Medications as of 09/30/2021  Medication Sig   acetaminophen (TYLENOL) 500 MG tablet Take 500 mg by mouth every 6 (six) hours as needed for moderate pain.   albuterol (VENTOLIN HFA) 108 (90 Base) MCG/ACT inhaler Inhale 2 puffs into the lungs every 6 (six) hours as needed.   Alirocumab (PRALUENT) 150 MG/ML SOAJ Inject 1 pen. into the skin every 14 (fourteen) days.   aspirin EC 81 MG tablet Take 1 tablet (81 mg total) by mouth daily. Swallow whole.   BAYER CONTOUR NEXT TEST test strip 1 each by Other route daily. As directed   budesonide (PULMICORT) 0.5 MG/2ML nebulizer solution Take 2 mLs (0.5 mg total) by nebulization 2 (two) times daily.   buPROPion (WELLBUTRIN XL) 300 MG 24 hr tablet TAKE ONE TABLET BY MOUTH EVERY MORNING   Cholecalciferol (VITAMIN D-3) 5000 UNITS TABS Take 5,000 Units by mouth every other day.    colchicine 0.6 MG tablet TAKE 1 TABLET (0.6 MG TOTAL) BY MOUTH DAILY AS NEEDED (FOR GOUT).   Continuous Blood Gluc Receiver ([place]) DEVI by Does not apply route.   Continuous Blood Gluc Sensor (DEXCOM G7 SENSOR) MISC by Does not apply route. Apply sensor every 10 days   dicyclomine (BENTYL) 20 MG tablet TAKE 1 TABLET UP TO FOUR TIMES A DAY FOR GI CRAMPING, PAIN, NAUSEA, AND VOMITING AS NEEDED   DULoxetine (CYMBALTA) 60 MG capsule TAKE 1 CAPSULE BY MOUTH ONCE DAILY.    fluticasone (FLONASE) 50 MCG/ACT nasal spray Place 2 sprays into both nostrils daily. (Patient taking differently: Place 2 sprays into both nostrils as needed.)   formoterol (PERFOROMIST) 20 MCG/2ML nebulizer solution Take 2 mLs (20 mcg total) by nebulization 2 (two) times daily.   furosemide (LASIX) 20 MG tablet Take 3 tablets (60 mg total) by mouth daily.   gabapentin (NEURONTIN) 300 MG capsule Take 1 capsule (300 mg total) by mouth at bedtime.   GLOBAL EASE INJECT PEN NEEDLES 32G X 4 MM MISC USE AS DIRECTED WITH VICTOZA AND TRESIBA.   insulin degludec (TRESIBA FLEXTOUCH) 200 UNIT/ML FlexTouch Pen Inject 120 Units into the skin daily.   ipratropium-albuterol (DUONEB) 0.5-2.5 (3) MG/3ML SOLN Take 3 mLs by nebulization every 6 (six) hours as needed (wheezing).   Iron, Ferrous Sulfate, 325 (65 Fe) MG TABS Take 325 mg by mouth every other day.   metolazone (ZAROXOLYN) 5 MG tablet TAKE 1 TABLET BY MOUTH AS NEEDED.   MICROLET LANCETS MISC 1 each by Other route. As directed   nystatin (MYCOSTATIN/NYSTOP) powder Apply 1 application topically 3 (three) times daily. (Patient taking differently: Apply 1 application  topically as needed.)   ondansetron (ZOFRAN) 4 MG tablet TAKE ONE TABLET BY MOUTH every EIGHT hours AS NEEDED FOR NAUSEA AND VOMITING   oxyCODONE-acetaminophen (PERCOCET/ROXICET) 5-325 MG tablet Take 1 tablet by mouth every 6 (six) hours as needed for severe pain. (Patient not taking: Reported on 09/22/2021)   pantoprazole (PROTONIX) 40 MG tablet TAKE ONE TABLET BY MOUTH EVERY  MORNING   polyethylene glycol (MIRALAX / GLYCOLAX) packet Take 17 g by mouth daily as needed for mild constipation.   potassium chloride (MICRO-K) 10 MEQ CR capsule Take 5 capsules (50 mEq total) by mouth 2 (two) times daily.   promethazine-dextromethorphan (PROMETHAZINE-DM) 6.25-15 MG/5ML syrup Take 2.5 mLs by mouth 4 (four) times daily as needed for cough (sedation caution).   revefenacin (YUPELRI) 175 MCG/3ML nebulizer  solution Inhale one vial in nebulizer once daily. Do not mix with other nebulized medications.   rOPINIRole (REQUIP) 4 MG tablet TAKE 1 TABLET BY MOUTH TWICE DAILY   spironolactone (ALDACTONE) 25 MG tablet TAKE ONE TABLET BY MOUTH ONCE DAILY   traMADol (ULTRAM) 50 MG tablet Take 1 tablet (50 mg total) by mouth 3 (three) times daily as needed.   TRULICITY 4.5 UX/3.2GM SOPN INJECT 4.5 MG ONCE WEEKLY   UNABLE TO FIND CPAP- At bedtime   No facility-administered encounter medications on file as of 09/30/2021.    BP Readings from Last 3 Encounters:  09/22/21 120/74  08/27/21 118/70  06/04/21 108/60    Lab Results  Component Value Date   HGBA1C 9.0 (A) 05/14/2021      Recent OV, Consult or Hospital visit:  No medication changes indicated   Last adherence delivery date:09/10/21      Patient is due for next adherence delivery on: 10/09/21  Spoke with patient on 09/30/21 reviewed medications and coordinated delivery.  This delivery to include: Adherence Packaging  30 Days  Packs: Bupropion HCL XL 365m- take 1 tablet at evening meal Cholecalciferol (vit D3)-5000units- take 1 tablet breakfast every other day  Ropinirole 458m take 1 tablet breakfast 1 tablet bedtime  Pantoprazole 4043mtake 1 tablet bedtime Spironolactone 50m72make 1 tablet breakfast Furosemide 20mg28me 3 tablets breakfast 3 tablet lunchtime Potassium chloride 10 meq ER- take 10 tablets at lunchtime Duloxetine 60mg-49me 1 tablet breakfast Ferrous Sulfate 350mg- 38m 1 tablet evening meal every other day  Gabapentin 300mg-ta55m tablet bedtime Cetirizine 10mg- ta31m tablet daily as needed Aspirin 81mg -tak31mtablet at breakfast  VIAL medications: Albuterol  108 mcg/act inhaler- 2 puffs into lungs every 6 hours as needed   Patient declined the following medications this month: [facility]   once weekly in pm now  [place] [facility]                injecting qhs now    Any concerns about  your medications? Yes  The patient does want Dr Duncan to Damita Dunningst her potassium labs and let her know if she should cut back on potassium tablets  How often do you forget or accidentally miss a dose? Never  Do you use a pillbox? No  Is patient in packaging Yes  If yes  What is the date on your next pill pack?09/29/21  Any concerns or issues with your packaging? No concerns with packs   Refills requested from providers include: Potassium chloride,  Confirmed delivery date of 10/09/21, advised patient that pharmacy will contact them the morning of delivery.  Recent blood pressure readings are as follows: none available    Recent blood glucose readings are as follows: 09/30/21- 2:00pm  217    ate peanut butter and crackers- average in the last 24 hrs.  170    Per Dexcom 7    Annual wellness visit in last year? Yes Most Recent BP reading:120/74  891-P 09/22/21  If Diabetic: Most recent A1C reading:9.0  05/14/21 Last eye exam / retinopathy screening:UTD Last diabetic foot exam:UTD  Cycle dispensing form sent to [facility], CPP for review.  [name], CPP notified  [name], [place]  [phone]

## 2021-10-03 ENCOUNTER — Encounter (HOSPITAL_COMMUNITY): Payer: Self-pay | Admitting: Gastroenterology

## 2021-10-06 DIAGNOSIS — J449 Chronic obstructive pulmonary disease, unspecified: Secondary | ICD-10-CM | POA: Diagnosis not present

## 2021-10-06 DIAGNOSIS — I10 Essential (primary) hypertension: Secondary | ICD-10-CM

## 2021-10-06 DIAGNOSIS — I5032 Chronic diastolic (congestive) heart failure: Secondary | ICD-10-CM | POA: Diagnosis not present

## 2021-10-06 DIAGNOSIS — E1165 Type 2 diabetes mellitus with hyperglycemia: Secondary | ICD-10-CM

## 2021-10-06 DIAGNOSIS — Z794 Long term (current) use of insulin: Secondary | ICD-10-CM

## 2021-10-08 ENCOUNTER — Other Ambulatory Visit: Payer: Self-pay | Admitting: Family Medicine

## 2021-10-08 DIAGNOSIS — N390 Urinary tract infection, site not specified: Secondary | ICD-10-CM | POA: Diagnosis not present

## 2021-10-09 NOTE — Anesthesia Preprocedure Evaluation (Addendum)
Anesthesia Evaluation  Patient identified by MRN, date of birth, ID band Patient awake    Reviewed: Allergy & Precautions, NPO status , Patient's Chart, lab work & pertinent test results  History of Anesthesia Complications (+) PONV and history of anesthetic complications  Airway Mallampati: III  TM Distance: >3 FB Neck ROM: Full    Dental  (+) Teeth Intact, Dental Advisory Given   Pulmonary shortness of breath and with exertion, asthma , sleep apnea and Continuous Positive Airway Pressure Ventilation , COPD,  COPD inhaler and oxygen dependent,  Cannot lay flat at night, sleeps with HOB elevated to about 30 degrees Uses O2 at night with CPAP and occasionally O2 as needed throughout the day  90-91% on RA in preop   Pulmonary exam normal breath sounds clear to auscultation       Cardiovascular hypertension, Pt. on medications +CHF (normal LVEF, grade 1 diastolic dysfunction)  Normal cardiovascular exam Rhythm:Regular Rate:Normal  Last echo 2021 1. Left ventricular ejection fraction, by estimation, is 70%. The left  ventricle has hyperdynamic function. The left ventricle has no regional  wall motion abnormalities. There is mild left ventricular hypertrophy.  Left ventricular diastolic parameters  are consistent with Grade I diastolic dysfunction (impaired relaxation).  2. Right ventricular systolic function is normal. The right ventricular  size is normal. severely increased right ventricular wall thickness.  Tricuspid regurgitation signal is inadequate for assessing PA pressure.  3. The mitral valve is normal in structure. No evidence of mitral valve  regurgitation. No evidence of mitral stenosis.  4. The aortic valve was not well visualized. Aortic valve regurgitation  is not visualized. No aortic stenosis is present.  5. Aortic dilatation noted. There is borderline dilatation of the  ascending aorta measuring 39 mm.  6.  The inferior vena cava is normal in size with greater than 50%  respiratory variability, suggesting right atrial pressure of 3 mmHg.  7. Increased flow velocities may be secondary to anemia, thyrotoxicosis,  hyperdynamic ventricles, or high flow state.    Neuro/Psych PSYCHIATRIC DISORDERS Depression negative neurological ROS     GI/Hepatic Neg liver ROS, PUD, GERD  Controlled,  Endo/Other  diabetes, Well Controlled, Type 2, Insulin DependentMorbid obesityBMI 89 FS 115 in preop  Renal/GU negative Renal ROS  negative genitourinary   Musculoskeletal  (+) Arthritis , Osteoarthritis,    Abdominal (+) + obese,   Peds  Hematology negative hematology ROS (+)   Anesthesia Other Findings   Reproductive/Obstetrics negative OB ROS                           Anesthesia Physical Anesthesia Plan  ASA: 4  Anesthesia Plan: MAC   Post-op Pain Management:    Induction:   PONV Risk Score and Plan: 2 and Propofol infusion and TIVA  Airway Management Planned: Natural Airway and Simple Face Mask  Additional Equipment: None  Intra-op Plan:   Post-operative Plan:   Informed Consent: I have reviewed the patients History and Physical, chart, labs and discussed the procedure including the risks, benefits and alternatives for the proposed anesthesia with the patient or authorized representative who has indicated his/her understanding and acceptance.     Dental advisory given  Plan Discussed with: CRNA  Anesthesia Plan Comments:        Anesthesia Quick Evaluation

## 2021-10-10 ENCOUNTER — Ambulatory Visit (HOSPITAL_COMMUNITY)
Admission: RE | Admit: 2021-10-10 | Discharge: 2021-10-10 | Disposition: A | Discharge status: Home / Self Care | Payer: Medicare HMO | Attending: Gastroenterology | Admitting: Gastroenterology

## 2021-10-10 ENCOUNTER — Encounter (HOSPITAL_COMMUNITY)
Admission: RE | Disposition: A | Discharge status: Home / Self Care | Payer: Self-pay | Source: Home / Self Care | Attending: Gastroenterology

## 2021-10-10 ENCOUNTER — Encounter (HOSPITAL_COMMUNITY): Payer: Self-pay | Admitting: Gastroenterology

## 2021-10-10 ENCOUNTER — Other Ambulatory Visit: Payer: Self-pay

## 2021-10-10 ENCOUNTER — Ambulatory Visit (HOSPITAL_COMMUNITY): Payer: Medicare HMO | Admitting: Anesthesiology

## 2021-10-10 ENCOUNTER — Ambulatory Visit (HOSPITAL_BASED_OUTPATIENT_CLINIC_OR_DEPARTMENT_OTHER): Payer: Medicare HMO | Admitting: Anesthesiology

## 2021-10-10 DIAGNOSIS — G4733 Obstructive sleep apnea (adult) (pediatric): Secondary | ICD-10-CM | POA: Diagnosis not present

## 2021-10-10 DIAGNOSIS — E1122 Type 2 diabetes mellitus with diabetic chronic kidney disease: Secondary | ICD-10-CM | POA: Insufficient documentation

## 2021-10-10 DIAGNOSIS — Z980 Intestinal bypass and anastomosis status: Secondary | ICD-10-CM | POA: Diagnosis not present

## 2021-10-10 DIAGNOSIS — K5150 Left sided colitis without complications: Secondary | ICD-10-CM

## 2021-10-10 DIAGNOSIS — Z139 Encounter for screening, unspecified: Secondary | ICD-10-CM | POA: Diagnosis not present

## 2021-10-10 DIAGNOSIS — I5032 Chronic diastolic (congestive) heart failure: Secondary | ICD-10-CM | POA: Diagnosis not present

## 2021-10-10 DIAGNOSIS — K6289 Other specified diseases of anus and rectum: Secondary | ICD-10-CM | POA: Insufficient documentation

## 2021-10-10 DIAGNOSIS — K5730 Diverticulosis of large intestine without perforation or abscess without bleeding: Secondary | ICD-10-CM | POA: Diagnosis not present

## 2021-10-10 DIAGNOSIS — Z1211 Encounter for screening for malignant neoplasm of colon: Secondary | ICD-10-CM | POA: Insufficient documentation

## 2021-10-10 DIAGNOSIS — K630 Abscess of intestine: Secondary | ICD-10-CM | POA: Diagnosis not present

## 2021-10-10 DIAGNOSIS — Z9989 Dependence on other enabling machines and devices: Secondary | ICD-10-CM | POA: Diagnosis not present

## 2021-10-10 DIAGNOSIS — K219 Gastro-esophageal reflux disease without esophagitis: Secondary | ICD-10-CM | POA: Insufficient documentation

## 2021-10-10 DIAGNOSIS — I110 Hypertensive heart disease with heart failure: Secondary | ICD-10-CM | POA: Diagnosis not present

## 2021-10-10 DIAGNOSIS — N189 Chronic kidney disease, unspecified: Secondary | ICD-10-CM | POA: Insufficient documentation

## 2021-10-10 DIAGNOSIS — K5190 Ulcerative colitis, unspecified, without complications: Secondary | ICD-10-CM | POA: Diagnosis not present

## 2021-10-10 DIAGNOSIS — I130 Hypertensive heart and chronic kidney disease with heart failure and stage 1 through stage 4 chronic kidney disease, or unspecified chronic kidney disease: Secondary | ICD-10-CM | POA: Insufficient documentation

## 2021-10-10 DIAGNOSIS — K529 Noninfective gastroenteritis and colitis, unspecified: Secondary | ICD-10-CM | POA: Diagnosis not present

## 2021-10-10 HISTORY — PX: BIOPSY: SHX5522

## 2021-10-10 HISTORY — PX: COLONOSCOPY WITH PROPOFOL: SHX5780

## 2021-10-10 LAB — GLUCOSE, CAPILLARY: Glucose-Capillary: 115 mg/dL — ABNORMAL HIGH (ref 70–99)

## 2021-10-10 SURGERY — COLONOSCOPY WITH PROPOFOL
Anesthesia: Monitor Anesthesia Care

## 2021-10-10 MED ORDER — LACTATED RINGERS IV SOLN: INTRAVENOUS | Status: DC

## 2021-10-10 MED ORDER — LACTATED RINGERS IV SOLN
INTRAVENOUS | Status: AC | PRN
  Administered 2021-10-10: 20 mL/h via INTRAVENOUS

## 2021-10-10 MED ORDER — PROPOFOL 500 MG/50ML IV EMUL
INTRAVENOUS | Status: DC | PRN
  Administered 2021-10-10: 125 ug/kg/min via INTRAVENOUS

## 2021-10-10 MED ORDER — PROPOFOL 10 MG/ML IV BOLUS
INTRAVENOUS | Status: DC | PRN
  Administered 2021-10-10: 30 mg via INTRAVENOUS

## 2021-10-10 MED ORDER — LIDOCAINE 2% (20 MG/ML) 5 ML SYRINGE
INTRAMUSCULAR | Status: DC | PRN
  Administered 2021-10-10: 40 mg via INTRAVENOUS

## 2021-10-10 SURGICAL SUPPLY — 22 items

## 2021-10-10 NOTE — Anesthesia Postprocedure Evaluation (Signed)
Anesthesia Post Note  Patient: Kathleen Hatfield  Procedure(s) Performed: COLONOSCOPY WITH PROPOFOL     Patient location during evaluation: PACU Anesthesia Type: MAC Level of consciousness: awake and alert Pain management: pain level controlled Vital Signs Assessment: post-procedure vital signs reviewed and stable Respiratory status: spontaneous breathing, nonlabored ventilation and respiratory function stable Cardiovascular status: blood pressure returned to baseline and stable Postop Assessment: no apparent nausea or vomiting Anesthetic complications: no   No notable events documented.  Last Vitals:  Vitals:   10/10/21 1010 10/10/21 1020  BP: (!) 165/52 (!) 131/54  Pulse: 73 73  Resp: 16 18  Temp:    SpO2: 100% 96%    Last Pain:  Vitals:   10/10/21 1020  TempSrc:   PainSc: 0-No pain                 Pervis Hocking

## 2021-10-10 NOTE — Op Note (Addendum)
[facility] Patient Name: Kathleen Hatfield Procedure Date: 10/10/2021 MRN: [id number] Attending MD: Carol Ada , MD Date of Birth: 27-Aug-1955 CSN: [id number] Age: 66 Admit Type: Inpatient Procedure:                Colonoscopy Indications:              High risk colon cancer surveillance: Ulcerative                            left sided colitis of 8 (or more) years duration Providers:                Carol Ada, MD, Mikey College, RN, Benetta Spar, Technician Referring MD:              Medicines:                 Complications:            No immediate complications. Estimated Blood Loss:     Estimated blood loss was minimal. Procedure:                Pre-Anesthesia Assessment:                           - Prior to the procedure, a History and Physical                            was performed, and patient medications and                            allergies were reviewed. The patient's tolerance of                            previous anesthesia was also reviewed. The risks                            and benefits of the procedure and the sedation                            options and risks were discussed with the patient.                            All questions were answered, and informed consent                            was obtained. Prior Anticoagulants: The patient has                            taken no previous anticoagulant or antiplatelet                            agents. ASA Grade Assessment: III - A patient with  severe systemic disease. After reviewing the risks                            and benefits, the patient was deemed in                            satisfactory condition to undergo the procedure.                           - Sedation was administered by an anesthesia                            professional. Deep sedation was attained.                           After obtaining informed consent, the  colonoscope                            was passed under direct vision. Throughout the                            procedure, the patient's blood pressure, pulse, and                            oxygen saturations were monitored continuously. The                            CF-HQ190L ([id number]) Olympus colonoscope was                            introduced through the anus and advanced to the the                            cecum, identified by appendiceal orifice and                            ileocecal valve. The colonoscopy was performed                            without difficulty. The patient tolerated the                            procedure well. The quality of the bowel                            preparation was evaluated using the BBPS [facility]                            Bowel Preparation Scale) with scores of: Right                            Colon = 3 (entire mucosa seen well with no residual  staining, small fragments of stool or opaque                            liquid), Transverse Colon = 3 (entire mucosa seen                            well with no residual staining, small fragments of                            stool or opaque liquid) and Left Colon = 3 (entire                            mucosa seen well with no residual staining, small                            fragments of stool or opaque liquid). The total                            BBPS score equals 9. The quality of the bowel                            preparation was good. The ileocecal valve,                            appendiceal orifice, and rectum were photographed. Scope In: 9:38:39 AM Scope Out: 9:54:06 AM Scope Withdrawal Time: 0 hours 10 minutes 34 seconds  Total Procedure Duration: 0 hours 15 minutes 27 seconds  Findings:      Scattered medium-mouthed diverticula were found in the entire colon.      There was evidence of a prior end-to-side colo-colonic anastomosis in       the  recto-sigmoid colon, 10 cm from the anal verge. This was patent and       was characterized by healthy appearing mucosa. The anastomosis was       traversed.      Normal mucosa was found in the entire colon. Biopsies were taken with a       cold forceps for histology. Impression:               - Diverticulosis in the entire examined colon.                           - Patent end-to-side colo-colonic anastomosis,                            characterized by healthy appearing mucosa.                           - Normal mucosa in the entire examined colon.                            Biopsied. Moderate Sedation:      Not Applicable - Patient had care per Anesthesia. Recommendation:           - Patient has a contact number available for  emergencies. The signs and symptoms of potential                            delayed complications were discussed with the                            patient. Return to normal activities tomorrow.                            Written discharge instructions were provided to the                            patient.                           - Resume previous diet.                           - Continue present medications.                           - Await pathology results.                           - Repeat colonoscopy in 2 years for surveillance. Procedure Code(s):        --- Professional ---                           [id number], Colonoscopy, flexible; with biopsy, single                            or multiple Diagnosis Code(s):        --- Professional ---                           K51.50, Left sided colitis without complications                           Z98.0, Intestinal bypass and anastomosis status                           K57.30, Diverticulosis of large intestine without                            perforation or abscess without bleeding CPT copyright 2019 American Medical Association. All rights reserved. The codes documented in this  report are preliminary and upon coder review may  be revised to meet current compliance requirements. Carol Ada, MD Carol Ada, MD 10/10/2021 9:59:17 AM This report has been signed electronically. Number of Addenda: 0

## 2021-10-10 NOTE — Transfer of Care (Signed)
Immediate Anesthesia Transfer of Care Note  Patient: Kathleen Hatfield  Procedure(s) Performed: COLONOSCOPY WITH PROPOFOL  Patient Location: PACU and Endoscopy Unit  Anesthesia Type:MAC  Level of Consciousness: awake and alert   Airway & Oxygen Therapy: Patient Spontanous Breathing and Patient connected to face mask oxygen  Post-op Assessment: Report given to RN and Post -op Vital signs reviewed and stable  Post vital signs: Reviewed and stable  Last Vitals:  Vitals Value Taken Time  BP    Temp    Pulse 77 10/10/21 1000  Resp 18 10/10/21 1000  SpO2 100 % 10/10/21 1000    Last Pain:  Vitals:   10/10/21 0847  TempSrc: Temporal  PainSc: 0-No pain         Complications: No notable events documented.

## 2021-10-10 NOTE — Anesthesia Procedure Notes (Signed)
Date/Time: 10/10/2021 9:30 AM  Performed by: Cynda Familia, CRNAPre-anesthesia Checklist: Patient identified, Emergency Drugs available, Suction available, Patient being monitored and Timeout performed Oxygen Delivery Method: Simple face mask Placement Confirmation: positive ETCO2 and breath sounds checked- equal and bilateral Dental Injury: Teeth and Oropharynx as per pre-operative assessment

## 2021-10-10 NOTE — H&P (Signed)
Kathleen Hatfield HPI: This 66 year old white female presents to the office for colon cancer screening.  Her last colonoscopy was postponed due to multiple active cardiopulmonary problems. She reports there has been no improvement in her lung disease and her pulmonary issues seem to have worsened over the last year. She has 4 BM's per day with no obvious blood or mucus in the stool. She has a lot of gas and bloating. She has a history of iron deficiency anemia and is currently taking Ferrous Sulfate on a daily basis. She has chronic nausea and has Ondansetron on hand. She is taking Pantoprazole for acid reflux with good control. She has good appetite and her weight has been stable. She denies having any complaints of abdominal pain, nausea, vomiting, acid reflux, dysphagia or odynophagia. She denies having a family history of colon cancer, celiac sprue or IBD. Her last surveillance colonoscopy was done on 10/09/2015 which revealed scattered diverticula in the entire colon; biopsies of the ascending, descending and transverse colon revealed unremarkable colonic mucosa. She has a history of ulcerative colitis.  Past Medical History:  Diagnosis Date   Anemia    Arthritis    Asthma    Chronic diastolic CHF 91/7915   Echocardiogram 05/2019: EF 70, no RWMA, mild LVH, Gr 1 DD, normal RVSF, severe LVH, borderline asc Aorta (39 mm)   CKD (chronic kidney disease)    Depression    Diabetes mellitus without complication (HCC)    Diverticulitis    Dyspnea    with exertion   GERD (gastroesophageal reflux disease)    History of blood transfusion    Hyperlipidemia    cannot tolerate statins   Hypertension    patient states she has never had high blood pressure.    Nuclear stress test    Myoview 05/2019: EF 83, no ischemia or infarction; Low Risk   Peripheral neuropathy    Pneumonia    PONV (postoperative nausea and vomiting)    RLS (restless legs syndrome)    Sleep apnea    uses CPAP   Ulcerative colitis ([place])     dr Collene Mares    Past Surgical History:  Procedure Laterality Date   ABDOMINAL HYSTERECTOMY     APPENDECTOMY     BREAST BIOPSY Left    BREAST SURGERY     left biopsy   CARDIAC CATHETERIZATION     CESAREAN SECTION     CHOLECYSTECTOMY     HERNIA REPAIR     INSERTION OF MESH N/A 11/15/2015   Procedure: INSERTION OF MESH;  Surgeon: [name], MD;  Location: [facility];  Service: General;  Laterality: N/A;   LAPAROSCOPIC LYSIS OF ADHESIONS N/A 11/15/2015   Procedure: LAPAROSCOPIC LYSIS OF ADHESIONS;  Surgeon: [name], MD;  Location: [facility];  Service: General;  Laterality: N/A;   RIGHT HEART CATHETERIZATION N/A 02/27/2013   Procedure: RIGHT HEART CATH;  Surgeon: Blane Ohara, MD;  Location: [facility] CATH LAB;  Service: Cardiovascular;  Laterality: N/A;   RIGHT HEART CATHETERIZATION N/A 12/14/2013   Procedure: RIGHT HEART CATH;  Surgeon: Larey Dresser, MD;  Location: [facility] CATH LAB;  Service: Cardiovascular;  Laterality: N/A;   SIGMOIDECTOMY  2010   diverticular disease   VENTRAL HERNIA REPAIR N/A 11/15/2015   Procedure: LAPAROSCOPIC VENTRAL WALL HERNIA REPAIR;  Surgeon: [name], MD;  Location: [facility];  Service: General;  Laterality: N/A;    Family History  Problem Relation Age of Onset   Heart disease Mother  Hypertension Mother    Dementia Mother    Heart disease Father    COPD Father    Hypertension Father    Heart disease Brother    Other Brother        knee replacement; hip replacement   Other Brother        cant brathe when laying down   Diabetes Maternal Grandmother    Breast cancer Neg Hx    Colon cancer Neg Hx     Social History:  reports that she has never smoked. She has never used smokeless tobacco. She reports that she does not drink alcohol and does not use drugs.  Allergies:  Allergies  Allergen Reactions   Almond Oil Anaphylaxis, Shortness Of Breath and Swelling   Morphine And Related Shortness Of Breath and Other (See Comments)    Pt. States  while in the hospital it affected her breathing, O2 dropped to the 70's   Atorvastatin Other (See Comments)    Leg weakness   Ceclor [Cefaclor] Nausea And Vomiting   Sulfa Antibiotics Nausea And Vomiting   Ciprofloxacin Other (See Comments)    Makes joints and muscles ache   Levaquin [Levofloxacin] Other (See Comments)    Body aches   Losartan Other (See Comments)    Weakness    Statins Other (See Comments)    Leg and body weakness    Medications: Scheduled: Continuous:  lactated ringers      No results found for this or any previous visit (from the past 24 hour(s)).   No results found.  ROS:  As stated above in the HPI otherwise negative.  There were no vitals taken for this visit.    PE: Gen: NAD, Alert and Oriented HEENT:  Kathleen Hatfield/AT, EOMI Neck: Supple, no LAD Lungs: CTA Bilaterally CV: RRR without M/G/R ABD: Soft, NTND, +BS Ext: No C/C/E  Assessment/Plan: 1) History of UC - surveillance colonoscopy.  Kathleen Hatfield D 10/10/2021, 8:42 AM

## 2021-10-10 NOTE — Discharge Instructions (Signed)
YOU HAD AN ENDOSCOPIC PROCEDURE TODAY: Refer to the procedure report and other information in the discharge instructions given to you for any specific questions about what was found during the examination. If this information does not answer your questions, please call [place] at [phone] to clarify.   YOU SHOULD EXPECT: Some feelings of bloating in the abdomen. Passage of more gas than usual. Walking can help get rid of the air that was put into your GI tract during the procedure and reduce the bloating.  DIET: Your first meal following the procedure should be a light meal and then it is ok to progress to your normal diet. A half-sandwich or bowl of soup is an example of a good first meal. Heavy or fried foods are harder to digest and may make you feel nauseous or bloated. Drink plenty of fluids but you should avoid alcoholic beverages for 24 hours.   ACTIVITY: Your care partner should take you home directly after the procedure. You should plan to take it easy, moving slowly for the rest of the day. You can resume normal activity the day after the procedure however YOU SHOULD NOT DRIVE, use power tools, machinery or perform tasks that involve climbing or major physical exertion for 24 hours (because of the sedation medicines used during the test).   SYMPTOMS TO REPORT IMMEDIATELY: A gastroenterologist can be reached at any hour. Please call [phone]  for any of the following symptoms:  Following lower endoscopy (colonoscopy, flexible sigmoidoscopy) Excessive amounts of blood in the stool  Significant tenderness, worsening of abdominal pains  Swelling of the abdomen that is new, acute  Fever of 100 or higher    FOLLOW UP:  If any biopsies were taken you will be contacted by phone or by letter within the next 1-3 weeks. Call [phone]  if you have not heard about the biopsies in 3 weeks.  Please also call with any specific questions about appointments or follow up tests.

## 2021-10-11 DIAGNOSIS — E1165 Type 2 diabetes mellitus with hyperglycemia: Secondary | ICD-10-CM | POA: Diagnosis not present

## 2021-10-11 DIAGNOSIS — Z794 Long term (current) use of insulin: Secondary | ICD-10-CM | POA: Diagnosis not present

## 2021-10-13 ENCOUNTER — Encounter (HOSPITAL_COMMUNITY): Payer: Self-pay | Admitting: Gastroenterology

## 2021-10-14 ENCOUNTER — Other Ambulatory Visit: Payer: Medicare HMO

## 2021-10-14 DIAGNOSIS — E782 Mixed hyperlipidemia: Secondary | ICD-10-CM

## 2021-10-14 LAB — LIPID PANEL
Chol/HDL Ratio: 3.9 ratio (ref 0.0–4.4)
Cholesterol, Total: 159 mg/dL (ref 100–199)
HDL: 41 mg/dL (ref >39)
LDL Chol Calc (NIH): 45 mg/dL (ref 0–99)
Triglycerides: 504 mg/dL — ABNORMAL HIGH (ref 0–149)
VLDL Cholesterol Cal: 73 mg/dL — ABNORMAL HIGH (ref 5–40)

## 2021-10-14 LAB — LDL CHOLESTEROL, DIRECT: LDL Direct: 76 mg/dL (ref 0–99)

## 2021-10-14 LAB — SURGICAL PATHOLOGY

## 2021-10-15 ENCOUNTER — Telehealth: Payer: Self-pay | Admitting: Pharmacist

## 2021-10-15 ENCOUNTER — Encounter (INDEPENDENT_AMBULATORY_CARE_PROVIDER_SITE_OTHER): Payer: Self-pay

## 2021-10-15 DIAGNOSIS — E782 Mixed hyperlipidemia: Secondary | ICD-10-CM

## 2021-10-15 NOTE — Telephone Encounter (Signed)
Discussed lipid panel results with pt. Direct LDL closer to goal < 70 at 76 on Praluent, notable improvement from baseline of 208 with hx of statin intolerance. TG even higher now likely due to uncontrolled DM, lifestyle and obesity. Previously advised pt to start fenofibrate in June however pt did not want to, and preferred to recheck labs again. Called pt to discuss starting fenofibrate again as TG are now even higher, she is agreeable. Will start lower dose of 91m daily due to her CKD, and will recheck labs when she sees Dr CBurt Knackfor follow up in 6 weeks. She is aware to continue her Praluent injections.

## 2021-10-15 NOTE — Addendum Note (Signed)
Addended by: Judea Fennimore E on: 10/15/2021 08:21 AM   Modules accepted: Orders

## 2021-10-19 DIAGNOSIS — J449 Chronic obstructive pulmonary disease, unspecified: Secondary | ICD-10-CM | POA: Diagnosis not present

## 2021-10-23 ENCOUNTER — Telehealth: Payer: Self-pay

## 2021-10-23 NOTE — Chronic Care Management (AMB) (Signed)
Chronic Care Management Pharmacy Assistant   Name: Kathleen Hatfield  MRN: [id number] DOB: 1956/02/21   Reason for Encounter: Reminder Call    Hospital visits:  None in previous 6 months 10/10/21-Kathleen Hung,MD(gastro)-[facility]-colonoscopy-no admission. Medications: Outpatient Encounter Medications as of 10/23/2021  Medication Sig Note   acetaminophen (TYLENOL) 500 MG tablet Take 500 mg by mouth every 6 (six) hours as needed for moderate pain.    albuterol (VENTOLIN HFA) 108 (90 Base) MCG/ACT inhaler Inhale 2 puffs into the lungs every 6 (six) hours as needed.    Alirocumab (PRALUENT) 150 MG/ML SOAJ Inject 1 pen. into the skin every 14 (fourteen) days.    aspirin EC 81 MG tablet Take 1 tablet (81 mg total) by mouth daily. Swallow whole.    BAYER CONTOUR NEXT TEST test strip 1 each by Other route daily. As directed    budesonide (PULMICORT) 0.5 MG/2ML nebulizer solution Take 2 mLs (0.5 mg total) by nebulization 2 (two) times daily.    buPROPion (WELLBUTRIN XL) 300 MG 24 hr tablet TAKE ONE TABLET BY MOUTH EVERY MORNING    cetirizine (ZYRTEC) 10 MG tablet Take 10 mg by mouth at bedtime.    Cholecalciferol (VITAMIN D-3) 5000 UNITS TABS Take 5,000 Units by mouth every other day.     colchicine 0.6 MG tablet TAKE 1 TABLET (0.6 MG TOTAL) BY MOUTH DAILY AS NEEDED (FOR GOUT).    Continuous Blood Gluc Receiver (Tabiona) DEVI by Does not apply route.    Continuous Blood Gluc Sensor (DEXCOM G7 SENSOR) MISC by Does not apply route. Apply sensor every 10 days    dicyclomine (BENTYL) 20 MG tablet TAKE 1 TABLET UP TO FOUR TIMES A DAY FOR GI CRAMPING, PAIN, NAUSEA, AND VOMITING AS NEEDED    DULoxetine (CYMBALTA) 60 MG capsule TAKE 1 CAPSULE BY MOUTH ONCE DAILY.    estradiol (ESTRACE) 0.1 MG/GM vaginal cream Place 1 Applicatorful vaginally 3 (three) times a week.    fluticasone (FLONASE) 50 MCG/ACT nasal spray Place 2 sprays into both nostrils daily. (Patient taking differently: Place  2 sprays into both nostrils as needed.)    formoterol (PERFOROMIST) 20 MCG/2ML nebulizer solution Take 2 mLs (20 mcg total) by nebulization 2 (two) times daily.    furosemide (LASIX) 20 MG tablet Take 3 tablets (60 mg total) by mouth daily.    gabapentin (NEURONTIN) 300 MG capsule Take 1 capsule (300 mg total) by mouth at bedtime.    GLOBAL EASE INJECT PEN NEEDLES 32G X 4 MM MISC USE AS DIRECTED WITH VICTOZA AND TRESIBA.    insulin degludec (TRESIBA FLEXTOUCH) 200 UNIT/ML FlexTouch Pen Inject 120 Units into the skin daily.    ipratropium-albuterol (DUONEB) 0.5-2.5 (3) MG/3ML SOLN Take 3 mLs by nebulization every 6 (six) hours as needed (wheezing).    Iron, Ferrous Sulfate, 325 (65 Fe) MG TABS Take 325 mg by mouth every other day. (Patient taking differently: Take 325 mg by mouth 3 (three) times a week.)    meloxicam (MOBIC) 15 MG tablet Take 7.5 mg by mouth daily as needed for pain.    metolazone (ZAROXOLYN) 5 MG tablet TAKE 1 TABLET BY MOUTH EVERY DAY AS NEEDED (Patient not taking: Reported on 10/09/2021)    MICROLET LANCETS MISC 1 each by Other route. As directed    nitrofurantoin (MACRODANTIN) 50 MG capsule Take 50 mg by mouth daily. 10/09/2021: Has not started   nystatin (MYCOSTATIN/NYSTOP) powder Apply 1 application topically 3 (three) times daily. (Patient taking  differently: Apply 1 application  topically daily as needed (Rash).)    ondansetron (ZOFRAN) 4 MG tablet TAKE ONE TABLET BY MOUTH every EIGHT hours AS NEEDED FOR NAUSEA AND VOMITING    OXYGEN Inhale 2 L into the lungs continuous.    pantoprazole (PROTONIX) 40 MG tablet TAKE ONE TABLET BY MOUTH EVERY MORNING    polyethylene glycol (MIRALAX / GLYCOLAX) packet Take 17 g by mouth daily as needed for mild constipation.    potassium chloride (MICRO-K) 10 MEQ CR capsule Take 5 capsules (50 mEq total) by mouth 2 (two) times daily.    revefenacin (YUPELRI) 175 MCG/3ML nebulizer solution Inhale one vial in nebulizer once daily. Do not mix with  other nebulized medications.    rOPINIRole (REQUIP) 4 MG tablet TAKE 1 TABLET BY MOUTH TWICE DAILY    spironolactone (ALDACTONE) 25 MG tablet TAKE ONE TABLET BY MOUTH ONCE DAILY    traMADol (ULTRAM) 50 MG tablet Take 1 tablet (50 mg total) by mouth 3 (three) times daily as needed.    TRULICITY 4.5 TY/6.0AY SOPN INJECT 4.5 MG ONCE WEEKLY    UNABLE TO FIND CPAP- At bedtime    No facility-administered encounter medications on file as of 10/23/2021.   [name] was contacted to remind of upcoming telephone visit with [name] on 10/27/21 at 3:45pm. Patient was reminded to have any blood glucose and blood pressure readings available for review at appointment.   Patient confirmed appointment.  Are you having any problems with your medications? No   Do you have any concerns you like to discuss with the pharmacist? No  CCM referral has been placed prior to visit?  Yes   Star Rating Drugs: Medication:  Last Fill: Day Supply [employer]                        08/11/21              90 Trulicity 0.4HT             09/23/21            [place], CPP notified  [name], [place]  [phone]

## 2021-10-27 ENCOUNTER — Ambulatory Visit (INDEPENDENT_AMBULATORY_CARE_PROVIDER_SITE_OTHER): Payer: Medicare HMO | Admitting: Pharmacist

## 2021-10-27 DIAGNOSIS — E782 Mixed hyperlipidemia: Secondary | ICD-10-CM

## 2021-10-27 DIAGNOSIS — J449 Chronic obstructive pulmonary disease, unspecified: Secondary | ICD-10-CM

## 2021-10-27 DIAGNOSIS — Z794 Long term (current) use of insulin: Secondary | ICD-10-CM

## 2021-10-27 DIAGNOSIS — I5032 Chronic diastolic (congestive) heart failure: Secondary | ICD-10-CM

## 2021-10-27 DIAGNOSIS — I10 Essential (primary) hypertension: Secondary | ICD-10-CM

## 2021-10-27 MED ORDER — FENOFIBRATE 48 MG PO TABS: 48.0000 mg | ORAL_TABLET | Freq: Every day | ORAL | 11 refills | Status: DC

## 2021-10-27 NOTE — Progress Notes (Signed)
1  Chronic Care Management Pharmacy Note  04/07/2021 Name:  Kathleen Hatfield MRN:  [id number] DOB:  [birth year]  Summary: CCM F/U visit -Reviewed medications; pt is compliant with pill packs -DM: A1c 9.0 (05/2021), pt recently started using Dexcom w/ reader but does not have any readings available today since sensor has been off for > 24 hr -Per chart review Cardiology was going to start fenofibrate for TRIG 504, Rx was not sent in   Recommendations/Changes made from today's visit: -Advised to place new Dexcom sensor and continue monitoring. Bring reader to upcoming PCP appt -Coordinate fenofibrate Rx with cardiology  Plan: -[facility] will call patient monthly for medication coordination -Pharmacist follow up televisit scheduled for 3 months -PCP annual 11/07/21    Subjective: Kathleen Hatfield is an 66 y.o. year old female who is a primary patient of Kathleen Hatfield, Kathleen Rising, MD.  The CCM team was consulted for assistance with disease management and care coordination needs.    Engaged with patient by telephone for follow up visit in response to provider referral for pharmacy case management and/or care coordination services.   Consent to Services:  The patient was given information about Chronic Care Management services, agreed to services, and gave verbal consent prior to initiation of services.  Please see initial visit note for detailed documentation.   Patient Care Team: [name], MD as PCP - General (Family Medicine) [name], MD as PCP - Cardiology (Cardiology) [name], MD as Consulting Physician (Pulmonary Disease) [name], MD as Consulting Physician (General Surgery) [name], [name], MD (Inactive) as Consulting Physician (Pulmonary Disease) [name], [facility] as Pharmacist (Pharmacist) [name] as Physician Assistant (Cardiology)  Recent office visits:  03/11/21 - [name], MD, PCP - Pt presented for diabetes follow up.  Increase insulin by 2 units/day for BP > 150. If you have more ankle swelling take 1 dose of metolazone.  Update me about your breathing and sugar next week.  02/27/21 - [name], MD, PCP - Pt presented for hospital follow up. Kathleen Hatfield  She was admitted with flu with hyperglycemia exacerbated by steroid use.  Improved in the meantime.  Discussed taking 1 dose of metolazone to see if that helped with her breathing and cough in the meantime.  She can use Tessalon for cough. 02/07/21 - Alma Friendly, NP - Pt presented for COPD exacerbation. Start doxycycline x 10 days and prednisone 40 mg x 5 days. Adjust insulin by 10 units for BG > 200.   11/28/20 - [name], MD, PCP - Pt presented for diabetes follow up and CHF. Stop meloxicam. Updated labs. A1c elevated, Referral to endocrinology. BNP normal, hold metolazone. Low iron, start supplementation.    Recent consult visits:  09/03/21 Cardiology TE - discuss lipid results on Praluent. Not at goal yet, pt declines additional med. Recheck lipids 6 weeks.  08/27/21-[name],MD (Pulmoanry): f/u sleep apnea-restart perforomist-Use yupelri in your nebulizer daily -previously seen by Dr. Wells Guiles Tat with [facility]; advised her to contact neurology if her tremor progressf/u 4 months  07/02/21 [place] PharmD (Lipid clinic): LDL 208. Start Praluent 150 mg q14d.  06/04/21-[name],PA(Cardiology)-F/U CHF-Recent creatinine stable. Arrange fasting Lipids(abnormal)  Refer to PharmD [facility]. Start ASA 81 mg once daily.  05/14/21 Dr [name] (Endocrine):  Establish care DM. Advised mealtime insulin, pt declined. Increased Tresiba to 120 units.   Hospital visits: 03/31/21 - ED visit, atypical chest pain 02/14/21 - 02/18/21 -  Admission, COPD exacerbation  02/08/21 - ED, COPD exacerbation 12/12/20 - ED visit, acute gout  Objective:  Lab Results  Component Value Date   CREATININE 1.47 (H) 09/22/2021   BUN 27 09/22/2021   GFR 48.76 (L) 02/27/2021    GFRNONAA 48 (L) 03/31/2021   GFRAA 69 01/19/2020   NA 140 09/22/2021   K 4.5 09/22/2021   CALCIUM 10.5 (H) 09/22/2021   CO2 29 09/22/2021   GLUCOSE 163 (H) 09/22/2021    Lab Results  Component Value Date/Time   HGBA1C 9.0 (A) 05/14/2021 03:10 PM   HGBA1C 10.9 (H) 02/14/2021 06:59 AM   HGBA1C 9.4 (H) 11/28/2020 03:56 PM   GFR 48.76 (L) 02/27/2021 12:16 PM   GFR 38.07 (L) 11/28/2020 03:56 PM    Last diabetic Eye exam:  Lab Results  Component Value Date/Time   HMDIABEYEEXA No Retinopathy 12/22/2019 12:00 AM    Last diabetic Foot exam: completed by PCP 11/28/2020 - normal    Lab Results  Component Value Date   CHOL 159 10/14/2021   HDL 41 10/14/2021   LDLCALC 45 10/14/2021   LDLDIRECT 76 10/14/2021   TRIG 504 (H) 10/14/2021   CHOLHDL 3.9 10/14/2021       Latest Ref Rng & Units 09/02/2021    8:50 AM 03/31/2021    8:03 PM 11/28/2020    3:56 PM  Hepatic Function  Total Protein 6.0 - 8.5 g/dL 7.3  7.3  7.0   Albumin 3.8 - 4.8 g/dL 4.6  3.5  3.8   AST 0 - 40 IU/L 23  24  18    ALT 0 - 32 IU/L 27  16  15    Alk Phosphatase 44 - 121 IU/L 113  88  92   Total Bilirubin 0.0 - 1.2 mg/dL 0.5  1.4  0.3   Bilirubin, Direct 0.00 - 0.40 mg/dL 0.15  0.4      Lab Results  Component Value Date/Time   TSH 2.850 06/09/2019 12:50 PM   TSH 2.31 04/15/2015 03:45 PM       Latest Ref Rng & Units 03/31/2021    6:06 PM 02/27/2021   12:16 PM 02/16/2021   12:40 AM  CBC  WBC 4.0 - 10.5 K/uL 9.7  10.6  16.1   Hemoglobin 12.0 - 15.0 g/dL 13.6  13.6  13.7   Hematocrit 36.0 - 46.0 % 41.6  41.9  42.3   Platelets 150 - 400 K/uL 328  360.0  259     Lab Results  Component Value Date/Time   VD25OH 45.52 06/18/2020 04:52 PM   VD25OH 47.60 06/22/2019 01:59 PM    Clinical ASCVD: No  The 10-year ASCVD risk score (Arnett DK, et al., 2019) is: 16.8%   Values used to calculate the score:     Age: 66 years     Sex: Female     Is Non-Hispanic African American: No     Diabetic: Yes     Tobacco  smoker: No     Systolic Blood Pressure: 035 mmHg     Is BP treated: Yes     HDL Cholesterol: 41 mg/dL     Total Cholesterol: 159 mg/dL       07/10/2021   11:14 AM 08/12/2020    1:54 PM 12/28/2017    2:09 PM  Depression screen PHQ 2/9  Decreased Interest 0 3 1  Down, Depressed, Hopeless 1 1 1   PHQ - 2 Score 1 4 2   Altered sleeping 2 2 3   Tired, decreased  energy 1 3 3   Change in appetite 0 2 0  Feeling bad or failure about yourself  0 3 3  Trouble concentrating 0 3 0  Moving slowly or fidgety/restless 0 3 3  Suicidal thoughts 0 0 0  PHQ-9 Score 4 20 14   Difficult doing work/chores Not difficult at all Somewhat difficult Very difficult    Social History   Tobacco Use  Smoking Status Never  Smokeless Tobacco Never   BP Readings from Last 3 Encounters:  10/30/21 134/86  10/10/21 (!) 131/54  09/22/21 120/74   Pulse Readings from Last 3 Encounters:  10/30/21 88  10/10/21 73  09/22/21 81   Wt Readings from Last 3 Encounters:  10/10/21 255 lb (115.7 kg)  09/22/21 257 lb (116.6 kg)  08/27/21 260 lb 9.6 oz (118.2 kg)   BMI Readings from Last 3 Encounters:  10/10/21 42.43 kg/m  09/22/21 42.77 kg/m  08/27/21 43.37 kg/m   Iron/TIBC/Ferritin/ %Sat    Component Value Date/Time   IRON 37 (L) 11/28/2020 1556   IRON 66 09/19/2019 0000   TIBC 430 09/19/2019 0000   FERRITIN 64.4 11/28/2020 1556   FERRITIN 50 09/19/2019 0000   IRONPCTSAT 15 09/19/2019 0000    Assessment/Interventions: Review of patient past medical history, allergies, medications, health status, including review of consultants reports, laboratory and other test data, was performed as part of comprehensive evaluation and provision of chronic care management services.   SDOH:  (Social Determinants of Health) assessments and interventions performed: Yes  SDOH Screenings   Alcohol Screen: Low Risk  (07/10/2021)   Alcohol Screen    Last Alcohol Screening Score (AUDIT): 0  Depression (PHQ2-9): Low Risk   (07/10/2021)   Depression (PHQ2-9)    PHQ-2 Score: 4  Financial Resource Strain: Low Risk  (07/10/2021)   Overall Financial Resource Strain (CARDIA)    Difficulty of Paying Living Expenses: Not hard at all  Food Insecurity: No Food Insecurity (07/10/2021)   Hunger Vital Sign    Worried About Running Out of Food in the Last Year: Never true    Ran Out of Food in the Last Year: Never true  Housing: Low Risk  (07/10/2021)   Housing    Last Housing Risk Score: 0  Physical Activity: Inactive (07/10/2021)   Exercise Vital Sign    Days of Exercise per Week: 0 days    Minutes of Exercise per Session: 0 min  Social Connections: Moderately Isolated (07/10/2021)   Social Connection and Isolation Panel [NHANES]    Frequency of Communication with Friends and Family: More than three times a week    Frequency of Social Gatherings with Friends and Family: Once a week    Attends Religious Services: Never    Marine scientist or Organizations: No    Attends Archivist Meetings: Never    Marital Status: Living with partner  Stress: No Stress Concern Present (07/10/2021)   [place]    Feeling of Stress : Not at all  Tobacco Use: Low Risk  (10/30/2021)   Patient History    Smoking Tobacco Use: Never    Smokeless Tobacco Use: Never    Passive Exposure: Not on file  Transportation Needs: No Transportation Needs (07/10/2021)   PRAPARE - Transportation    Lack of Transportation (Medical): No    Lack of Transportation (Non-Medical): No    CCM Care Plan  Allergies  Allergen Reactions   Almond Oil Anaphylaxis, Shortness Of Breath  and Swelling   Morphine And Related Shortness Of Breath and Other (See Comments)    Pt. States while in the hospital it affected her breathing, O2 dropped to the 70's   Atorvastatin Other (See Comments)    Leg weakness   Ceclor [Cefaclor] Nausea And Vomiting   Sulfa Antibiotics Nausea And Vomiting    Ciprofloxacin Other (See Comments)    Makes joints and muscles ache   Levaquin [Levofloxacin] Other (See Comments)    Body aches   Losartan Other (See Comments)    Weakness    Statins Other (See Comments)    Leg and body weakness    Medications Reviewed Today     Reviewed by [name], [facility] (Pharmacist) on 10/31/21 at 1516  Med List Status: <None>   Medication Order Taking? Sig Documenting Provider Last Dose Status Informant  acetaminophen (TYLENOL) 500 MG tablet [id number] Yes Take 500 mg by mouth every 6 (six) hours as needed for moderate pain. [provider] Taking Active Self  albuterol (VENTOLIN HFA) 108 (90 Base) MCG/ACT inhaler [id number] Yes Inhale 2 puffs into the lungs every 6 (six) hours as needed. [name], MD Taking Active Self  Alirocumab (PRALUENT) 150 MG/ML Darden Palmer [id number] Yes Inject 1 pen. into the skin every 14 (fourteen) days. Supple, Harlon Flor, RPH-CPP Taking Active Self  aspirin EC 81 MG tablet [id number] Yes Take 1 tablet (81 mg total) by mouth daily. Swallow whole. Richardson Dopp T, PA-C Taking Active Self  BAYER CONTOUR NEXT TEST test strip [id number] Yes 1 each by Other route daily. As directed [provider] Taking Active Self           Med Note Tamala Julian, JEFFREY W   Mon May 03, 2017 11:02 PM)    budesonide (PULMICORT) 0.5 MG/2ML nebulizer solution [id number] Yes Take 2 mLs (0.5 mg total) by nebulization 2 (two) times daily. [name], MD Taking Active Self  buPROPion (WELLBUTRIN XL) 300 MG 24 hr tablet [id number] Yes TAKE ONE TABLET BY MOUTH EVERY MORNING [name], MD Taking Active   cephALEXin (KEFLEX) 250 MG capsule [id number] Yes Take 1 capsule (250 mg total) by mouth 3 (three) times daily for 7 days. Barrett Henle, MD Taking Active   cetirizine (ZYRTEC) 10 MG tablet [id number] Yes Take 10 mg by mouth at bedtime. [provider] Taking Active Self  Cholecalciferol (VITAMIN D-3) 5000 UNITS TABS [id number] Yes Take  5,000 Units by mouth every other day.  [provider] Taking Active Self  colchicine 0.6 MG tablet [id number] Yes TAKE 1 TABLET (0.6 MG TOTAL) BY MOUTH DAILY AS NEEDED (FOR GOUT). [name], MD Taking Active Self  Continuous Blood Gluc Receiver ([place]) [place] [id number] Yes by Does not apply route. [provider] Taking Active Self  Continuous Blood Gluc Sensor ([place]) [place] [id number] Yes by Does not apply route. Apply sensor every 10 days [provider] Taking Active Self  dicyclomine (BENTYL) 20 MG tablet [id number] Yes TAKE 1 TABLET UP TO FOUR TIMES A DAY FOR GI CRAMPING, PAIN, NAUSEA, AND VOMITING AS NEEDED [name], MD Taking Active Self  DULoxetine (CYMBALTA) 60 MG capsule [id number] Yes TAKE 1 CAPSULE BY MOUTH ONCE DAILY. [name], MD Taking Active Self  estradiol (ESTRACE) 0.1 MG/GM vaginal cream [id number] Yes Place 1 Applicatorful vaginally 3 (three) times a week. [provider] Taking Active Self  fenofibrate (TRICOR) 48 MG tablet [id number]  Take 1 tablet (48 mg total)  by mouth daily. [name], MD  Active   fluticasone [facility]) 50 MCG/ACT nasal spray [id number] Yes Place 2 sprays into both nostrils daily.  Patient taking differently: Place 2 sprays into both nostrils as needed.   Evlyn Courier, PA-C Taking Active Self  formoterol (PERFOROMIST) 20 MCG/2ML nebulizer solution [id number] Yes Take 2 mLs (20 mcg total) by nebulization 2 (two) times daily. [name], MD Taking Active Self  furosemide (LASIX) 20 MG tablet [id number] Yes Take 3 tablets (60 mg total) by mouth daily. [name], MD Taking Active Self  gabapentin (NEURONTIN) 300 MG capsule [id number] Yes Take 1 capsule (300 mg total) by mouth at bedtime. [name], MD Taking Active Self  GLOBAL EASE INJECT PEN NEEDLES 32G X 4 MM MISC [id number] Yes USE AS DIRECTED WITH VICTOZA AND TRESIBA. [name], MD Taking Active Self  insulin  degludec (TRESIBA FLEXTOUCH) 200 UNIT/ML FlexTouch Pen [id number] Yes Inject 120 Units into the skin daily. [provider] Taking Active Self  ipratropium-albuterol (DUONEB) 0.5-2.5 (3) MG/3ML SOLN [id number] Yes Take 3 mLs by nebulization every 6 (six) hours as needed (wheezing). [name], MD Taking Active Self  Iron, Ferrous Sulfate, 325 (65 Fe) MG TABS [id number] Yes Take 325 mg by mouth every other day.  Patient taking differently: Take 325 mg by mouth 3 (three) times a week.   [name], MD Taking Active Self           Med Note Olena Heckle, MICHELE   Wed Jun 04, 2021  2:59 PM)    meloxicam (MOBIC) 15 MG tablet [id number] Yes Take 7.5 mg by mouth daily as needed for pain. [provider] Taking Active Self  metolazone (ZAROXOLYN) 5 MG tablet [id number] Yes TAKE 1 TABLET BY MOUTH EVERY DAY AS NEEDED [name], MD Taking Active Self  MICROLET LANCETS [place] [id number] Yes 1 each by Other route. As directed [provider] Taking Active Self           Med Note Mickie Bail Oct 21, 2015  3:31 PM)    nitrofurantoin (MACRODANTIN) 50 MG capsule [id number] Yes Take 50 mg by mouth daily. [provider] Taking Active Self           Med Note Joellyn Haff Oct 09, 2021  9:02 AM) Has not started  nystatin (MYCOSTATIN/NYSTOP) powder [id number] Yes Apply 1 application topically 3 (three) times daily.  Patient taking differently: Apply 1 application  topically daily as needed (Rash).   [name], MD Taking Active Self  ondansetron (ZOFRAN) 4 MG tablet [id number] Yes TAKE ONE TABLET BY MOUTH every EIGHT hours AS NEEDED FOR NAUSEA AND VOMITING [name], MD Taking Active Self  OXYGEN [id number] Yes Inhale 2 L into the lungs continuous. [provider] Taking Active Self  pantoprazole (PROTONIX) 40 MG tablet [id number] Yes TAKE ONE TABLET BY MOUTH EVERY MORNING [name], MD Taking Active Self  polyethylene glycol [facility]  / GLYCOLAX) packet [id number] Yes Take 17 g by mouth daily as needed for mild constipation. [provider] Taking Active Self  potassium chloride (MICRO-K) 10 MEQ CR capsule [id number] Yes Take 5 capsules (50 mEq total) by mouth 2 (two) times daily. [name], MD Taking Active Self  revefenacin [facility]) 175 MCG/3ML nebulizer solution [id number] Yes Inhale one vial in nebulizer once daily. Do not mix with other nebulized medications. [name], MD Taking Active Self  rOPINIRole (REQUIP) 4 MG tablet [id number]  Yes TAKE 1 TABLET BY MOUTH TWICE DAILY [name], MD Taking Active Self  spironolactone (ALDACTONE) 25 MG tablet [id number] Yes TAKE ONE TABLET BY MOUTH ONCE DAILY [name], MD Taking Active Self  traMADol (ULTRAM) 50 MG tablet [id number] Yes Take 1 tablet (50 mg total) by mouth 3 (three) times daily as needed. [name], MD Taking Active Self  TRULICITY 4.5 YI/0.1KP SOPN [id number] Yes INJECT 4.5 MG ONCE WEEKLY [name], MD Taking Active Self  UNABLE TO FIND [id number] Yes CPAP- At bedtime [provider] Taking Active Self            Patient Active Problem List   Diagnosis Date Noted   Aortic atherosclerosis ([place]) 06/04/2021   Preoperative cardiovascular examination 06/04/2021   Hyperglycemia 02/14/2021   Rash 09/27/2020   COPD exacerbation ([place]) 09/21/2020   Acute exacerbation of COPD with asthma ([place]) 09/19/2020   Mixed diabetic hyperlipidemia associated with type 2 diabetes mellitus ([place]) 09/19/2020   Pain of foot 06/20/2020   Medicare annual wellness visit, initial 06/20/2020   Constipation 06/11/2020   Diverticulosis of colon 06/11/2020   Irritable bowel syndrome 12/13/1217   Periumbilical pain 75/88/3254   RLS (restless legs syndrome)    Asthma exacerbation 01/08/2020   Muscle weakness 01/08/2020   Grade I diastolic dysfunction 98/26/4158   Drug-induced myopathy 10/10/2019   Advance care planning 06/14/2019   RLQ  abdominal pain 06/14/2019   COPD (chronic obstructive pulmonary disease) ([place]) 06/14/2019   Leukocytosis 06/14/2019   Hypercalcemia 06/14/2019   Gout 06/14/2019   Type 2 diabetes mellitus with hyperglycemia, without long-term current use of insulin ([place])    COPD with acute exacerbation ([place]) 05/03/2017   Diabetes mellitus without complication (HCC)    CKD (chronic kidney disease) stage 3, GFR 30-59 ml/min (HCC)    Hypersomnia with sleep apnea 09/16/2015   Fatigue 06/07/2015   Morbid obesity ([place]) 06/07/2015   (HFpEF) heart failure with preserved ejection fraction (HCC)    Pneumonia of both lower lobes due to infectious organism 02/09/2013   OSA on CPAP 08/23/2012   Essential hypertension    Depression    GERD without esophagitis    Hyperlipidemia    Ulcerative colitis ([place])    Patellar tendinitis 05/25/2011   Knee pain 05/25/2011   Myofascial pain 05/25/2011   Cervicalgia 05/25/2011    Immunization History  Administered Date(s) Administered   Influenza Split 12/01/2012, 01/18/2017   Influenza Whole 12/25/2011   Influenza,inj,Quad PF,6+ Mos 11/27/2013, 12/08/2014, 12/11/2015   Influenza-Unspecified 12/07/2017   Janssen (J&J) SARS-COV-2 Vaccination 06/15/2019   Pneumococcal Conjugate-13 02/17/2017   Pneumococcal Polysaccharide-23 11/27/2013   Tdap 04/01/2010    Conditions to be addressed/monitored:  Hypertension, Hyperlipidemia, Diabetes, Heart Failure, COPD, Asthma, Chronic Kidney Disease, and Depression  Care Plan : CCM Pharmacy Care Plan  Updates made by [name], [place] since 10/31/2021 12:00 AM     Problem: Hypertension, Hyperlipidemia, Diabetes, Heart Failure, COPD, Asthma, Chronic Kidney Disease, and Depression   Priority: High     Long-Range Goal: Disease Management   Start Date: 07/29/2020  Expected End Date: 05/24/2022  This Visit's Progress: On track  Recent Progress: On track  Priority: High  Note:   Current Barriers:  Unable to achieve control  of diabetes, COPD  Pharmacist Clinical Goal(s):  Patient will contact provider office for questions/concerns as evidenced notation of same in electronic health record through collaboration with PharmD and provider.   Interventions: 1:1 collaboration with [name], MD  regarding development and update of comprehensive plan of care as evidenced by provider attestation and co-signature Inter-disciplinary care team collaboration (see longitudinal plan of care) Comprehensive medication review performed; medication list updated in electronic medical record  Hyperlipidemia: (LDL goal < 70) -Uncontrolled - LDL 76 (10/2021), improved from 208 (06/2021) with Praluent; TRIG 504 likely related to uncontrolled DM -High ASCVD risk (25%) -Current treatment: Praluent 150 mg q 14 days - Appropriate, Effective, Safe, Accessible Fenofibrate 48 mg daily ? -Medications previously tried: Multiple statins (atorvastatin, others) - leg weakness; Vascepa (cost) -Educated on Cholesterol goals; benefits of Prauluent -Recommend to continue current medication; follow up with cardiology for lab work as scheduled  Diabetes (A1c goal <7%) -Uncontrolled, improving- A1c 9.0 (05/2021), pt endorses improvement in BG on higher dose of insulin and Trulicity.  -Follows with Dr [name] (established care 05/2021). Dx 2017, insulin since 2020. Hx of chronic UTIs, would avoid SGLT2-inhibitor -Current home glucose readings: via Dexcom G7; sensor has been off for a day and she cannot provide any readings as she can only find 24-hr of info with her reader -Current medications: Trulicity 4.5 mg weekly (Thurs) - Appropriate, Query Effective Tresiba 120 units daily (midday) -Appropriate, Query Effective Dexcom G7 w/ reader - Appropriate, Effective, Safe, Accessible -Medications previously tried: Jardiance, Victoza, metformin -Current meal patterns: The patient reports she is on a salt free, sugar free, gluten free diet but had a hard  time sticking to this. She tries to limit portions.  -Recommend to continue current medication  Heart Failure (Goal: manage symptoms and prevent exacerbations) -Controlled - per pt report of symptoms. She has not been checking her weight regularly; pt has previously had trouble swallowing large tablets but does well with Potassium 10 mEq capsules.  -Followed by Dr. [name] with [facility] -Last ejection fraction: 70% (Date: 03/21) -HF type: Diastolic; NYHA Class: II (slight limitation of activity) -Current treatment: Spironolactone 25 mg daily AM - Appropriate, Effective, Safe, Accessible Furosemide 20 mg - 3 tab AM + 3 tab PRN- Appropriate, Effective, Safe, Accessible Potassium 10 mEq - 4 AM, 2 PM, 4 HS - Appropriate, Effective, Safe, Accessible Metolazone 2.5 mg PRN wt gain - Appropriate, Effective, Safe, Accessible -Medications previously tried: torsemide, bumetanide, losartan  -Recommended to continue current medication;  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) -Improving - pt has had multiple hospitalizations for COPD exacerbation in 2022; she is now taking her nebulizers as directed and reports doing better overall, using Duoneb and albuterol rescue less often; she wears Oxygen at night and PRN during the day;  -Followed by Dr Halford Chessman, [facility]. Pt does not follow up with pulmonology as frequently as recommended.  -Pulmonary function testing: 2021 FEV1 42% predicted -Gold Grade: Gold 3 (FEV1 30-49%) -Current COPD Classification:  E (high sx, >2 exacerbations/yr) -Exacerbations requiring treatment in last 6 months: yes, multiple -Compliant with CPAP nightly -Current regimen: Pulmicort nebuilzer BID - Appropriate, Effective, Safe, Accessible Yupelri nebulizer daily -Appropriate, Effective, Safe, Accessible Albuterol/ipratropium nebulizer - takes 2-3 times per day as needed - Appropriate, Effective, Safe, Accessible Albuterol inhaler  - 2 puffs every 6 hours PRN - Appropriate, Effective, Safe, Accessible -Medications previously tried: per pt inhalers less effective than nebulizers, Perforomist was unaffordable; She was previously on Advair, Incruse and most recently Trelegy but says she has stopped these because she does not like the powder inhalers. -Recommended to continue current medication; follow up with pulmonology more regularly  Depression/Anxiety/Pain(Goal: Control symptoms) -Controlled, stable per patient;  she asked about changing gabapentin to 300 mg dose due to pill burden -Current treatment: Bupropion 300 mg daily - Appropriate, Effective, Safe, Accessible Cymbalta 60 mg daily - Appropriate, Effective, Safe, Accessible Gabapentin 100 mg - 3 caps HS - Appropriate, Effective, Safe, Accessible Ropinirole 4 mg BID (RLS) - Appropriate, Effective, Query Safe -Medications previously tried/failed: none -Concern that ropinirole may be causing her to fall asleep suddenly during the day. This has been discussed with patient before and per chart records, she was unable to tolerate dose reductions. We will revisit this issue periodically -Recommend to continue current medication  Medication management (Goal: improve adherence) Pill Packs (next delivery 11/10/21) Bupropion HCL XL 373m- take 1 tablet at evening meal Cholecalciferol (vit D3)-5000units- take 1 tablet breakfast every other day  Ropinirole 436m take 1 tablet breakfast 1 tablet bedtime  Pantoprazole 4016mtake 1 tablet bedtime Spironolactone 49m74make 1 tablet breakfast Furosemide 20mg65me -3 tablets breakfast Potassium chloride 10 meq ER- take 10 tablets at lunchtime Duloxetine 60mg-21me 1 tablet breakfast Ferrous Sulfate 349mg- 1m 1 tablet evening meal every other day  Gabapentin 300mg-ta26m tablet bedtime Cetirizine 10mg- ta78m tablet daily as needed Aspirin 81mg -tak70mtablet at breakfast    Patient declined the following medications this  month: Trulicity 4.5mg- [facility]   once weekly in pm now  [place] [facility] [place] [facility]                injecting qhs now   Patient Goals/Self-Care Activities Patient will:  - take medications as prescribed as evidenced by patient report and record review focus on medication adherence by PILL PACKS check glucose daily, document, and provide at future appointments check blood pressure daily, document, and provide at future appointments       Medication Assistance: None required.  Patient affirms current coverage meets needs.  Medication Access: Within the past 30 days, how often has patient missed a dose of medication? 0 Is a pillbox or other method used to improve adherence? Yes  Factors that may affect medication adherence? no barriers identified Are meds synced by current pharmacy? Yes  Are meds delivered by current pharmacy? Yes  Does patient experience delays in picking up medications due to transportation concerns? No   [facility] Reviewed: Is patient disadvantaged to use [facility]?: No  Current Rx insurance plan: [employer] [place] Name and location of Current pharmacy:  [id number] [place] [phone] - [place] [address] [phone] services reviewed with patient today?: Yes  30-day packs (last delivery 09/10/21): Bupropion HCL XL 300mg- take 13mblet at evening meal Cholecalciferol (vit D3)-5000units- take 1 tablet breakfast every other day  Ropinirole 4mg- take 1 29mlet breakfast 1 tablet bedtime  Pantoprazole 40mg- take 1 17met bedtime Spironolactone 49mg- take 1 t85mt breakfast Furosemide 20mg take 3 tab23m breakfast 3 tablet lunchtime Potassium chloride 10 meq ER- take 10 tablets at lunchtime Duloxetine  60mg- take 1 tab10mbreakfast Ferrous Sulfate 349mg- take 1 tabl39mvening meal every other day  Gabapentin 300mg-take 1 tablet75mtime Cetirizine 10mg- take 1 tablet44mly as needed Aspirin 81mg -take 1 tablet 16mreakfast Trulicity and [facility]  Care Plan and Follow Up Patient Decision:  Patient agrees to Care Plan and Follow-up.  Follow Up  Plan: Telephone follow up appointment with care management team member scheduled for: 3 months  [name], PharmD, BCACP Clinical Pharmacist [facility] [phone]

## 2021-10-27 NOTE — Addendum Note (Signed)
Addended by: Chrishon Martino E on: 10/27/2021 04:23 PM   Modules accepted: Orders

## 2021-10-28 ENCOUNTER — Other Ambulatory Visit: Payer: Self-pay | Admitting: Family Medicine

## 2021-10-28 NOTE — Telephone Encounter (Signed)
Patient did not have her medicare wellness visit with Dr. [name] after seeing the health advisor nurse in may. Please call to schedule AWV part 2 with Dr. [name].

## 2021-10-28 NOTE — Telephone Encounter (Signed)
Patient scheduled.

## 2021-10-30 ENCOUNTER — Telehealth: Payer: Self-pay

## 2021-10-30 ENCOUNTER — Ambulatory Visit
Admission: RE | Admit: 2021-10-30 | Discharge: 2021-10-30 | Disposition: A | Discharge status: Home / Self Care | Payer: Medicare HMO | Source: Ambulatory Visit | Attending: Family Medicine | Admitting: Family Medicine

## 2021-10-30 VITALS — BP 134/86 | HR 88 | Temp 98.3°F | Resp 18

## 2021-10-30 DIAGNOSIS — R8281 Pyuria: Secondary | ICD-10-CM | POA: Diagnosis not present

## 2021-10-30 DIAGNOSIS — M791 Myalgia, unspecified site: Secondary | ICD-10-CM | POA: Diagnosis not present

## 2021-10-30 DIAGNOSIS — Z881 Allergy status to other antibiotic agents status: Secondary | ICD-10-CM | POA: Diagnosis not present

## 2021-10-30 DIAGNOSIS — Z20822 Contact with and (suspected) exposure to covid-19: Secondary | ICD-10-CM | POA: Diagnosis not present

## 2021-10-30 DIAGNOSIS — Z882 Allergy status to sulfonamides status: Secondary | ICD-10-CM | POA: Insufficient documentation

## 2021-10-30 LAB — POCT URINALYSIS DIP (MANUAL ENTRY)
Bilirubin, UA: NEGATIVE
Blood, UA: NEGATIVE
Glucose, UA: NEGATIVE mg/dL
Ketones, POC UA: NEGATIVE mg/dL
Nitrite, UA: NEGATIVE
Protein Ur, POC: 30 mg/dL — AB
Spec Grav, UA: 1.02 (ref 1.010–1.025)
Urobilinogen, UA: 1 E.U./dL
pH, UA: 8 (ref 5.0–8.0)

## 2021-10-30 MED ORDER — CEPHALEXIN 250 MG PO CAPS: 250.0000 mg | ORAL_CAPSULE | Freq: Three times a day (TID) | ORAL | 0 refills | Status: AC

## 2021-10-30 NOTE — Telephone Encounter (Signed)
I spoke with pt; pt said for about a year has had shakiness all over her body but worse last couple of days. Pt has had achiness from head to toe and fuzziness in head for 2 days. No H/A or dizziness and no more CP and SOB than usual. Pt said she feels fatigue. No fever, no cough, no abd pain or N&V. Pt is not having any new weakness in arms or legs. Pt said is needing knee surgery and that affects her walking some what. Pt is not in any distress and does not want to go to ED but wants to see someone today while having the symptoms listed above. No available appts at [facility] this afternoon and pt scheduled appt at [facility] today at 2 PM. UC & ED precautions given and pt voiced understanding. Sending note to Dr [name] and Janett Billow CMA.

## 2021-10-30 NOTE — ED Triage Notes (Signed)
Pt presents with fatigue and generalized body aches since last night; pt also has complaints of feeling more short of breath than usual.

## 2021-10-30 NOTE — Chronic Care Management (AMB) (Addendum)
Chronic Care Management Pharmacy Assistant   Name: Kathleen Hatfield  MRN: [id number] DOB: [birth year]   Reason for Encounter: Medication Adherence and Delivery Coordination    Recent office visits:  None since last CCM contact  Recent consult visits:  None since last CCM contact  Hospital visits:  None in previous 6 months  Medications: Outpatient Encounter Medications as of 10/30/2021  Medication Sig Note   acetaminophen (TYLENOL) 500 MG tablet Take 500 mg by mouth every 6 (six) hours as needed for moderate pain.    albuterol (VENTOLIN HFA) 108 (90 Base) MCG/ACT inhaler Inhale 2 puffs into the lungs every 6 (six) hours as needed.    Alirocumab (PRALUENT) 150 MG/ML SOAJ Inject 1 pen. into the skin every 14 (fourteen) days.    aspirin EC 81 MG tablet Take 1 tablet (81 mg total) by mouth daily. Swallow whole.    BAYER CONTOUR NEXT TEST test strip 1 each by Other route daily. As directed    budesonide (PULMICORT) 0.5 MG/2ML nebulizer solution Take 2 mLs (0.5 mg total) by nebulization 2 (two) times daily.    buPROPion (WELLBUTRIN XL) 300 MG 24 hr tablet TAKE ONE TABLET BY MOUTH EVERY MORNING    cetirizine (ZYRTEC) 10 MG tablet Take 10 mg by mouth at bedtime.    Cholecalciferol (VITAMIN D-3) 5000 UNITS TABS Take 5,000 Units by mouth every other day.     colchicine 0.6 MG tablet TAKE 1 TABLET (0.6 MG TOTAL) BY MOUTH DAILY AS NEEDED (FOR GOUT).    Continuous Blood Gluc Receiver ([place]) DEVI by Does not apply route.    Continuous Blood Gluc Sensor (DEXCOM G7 SENSOR) MISC by Does not apply route. Apply sensor every 10 days    dicyclomine (BENTYL) 20 MG tablet TAKE 1 TABLET UP TO FOUR TIMES A DAY FOR GI CRAMPING, PAIN, NAUSEA, AND VOMITING AS NEEDED    DULoxetine (CYMBALTA) 60 MG capsule TAKE 1 CAPSULE BY MOUTH ONCE DAILY.    estradiol (ESTRACE) 0.1 MG/GM vaginal cream Place 1 Applicatorful vaginally 3 (three) times a week.    fenofibrate (TRICOR) 48 MG tablet Take 1 tablet (48 mg  total) by mouth daily.    fluticasone (FLONASE) 50 MCG/ACT nasal spray Place 2 sprays into both nostrils daily. (Patient taking differently: Place 2 sprays into both nostrils as needed.)    formoterol (PERFOROMIST) 20 MCG/2ML nebulizer solution Take 2 mLs (20 mcg total) by nebulization 2 (two) times daily.    furosemide (LASIX) 20 MG tablet Take 3 tablets (60 mg total) by mouth daily.    gabapentin (NEURONTIN) 300 MG capsule Take 1 capsule (300 mg total) by mouth at bedtime.    GLOBAL EASE INJECT PEN NEEDLES 32G X 4 MM MISC USE AS DIRECTED WITH VICTOZA AND TRESIBA.    insulin degludec (TRESIBA FLEXTOUCH) 200 UNIT/ML FlexTouch Pen Inject 120 Units into the skin daily.    ipratropium-albuterol (DUONEB) 0.5-2.5 (3) MG/3ML SOLN Take 3 mLs by nebulization every 6 (six) hours as needed (wheezing).    Iron, Ferrous Sulfate, 325 (65 Fe) MG TABS Take 325 mg by mouth every other day. (Patient taking differently: Take 325 mg by mouth 3 (three) times a week.)    meloxicam (MOBIC) 15 MG tablet Take 7.5 mg by mouth daily as needed for pain.    metolazone (ZAROXOLYN) 5 MG tablet TAKE 1 TABLET BY MOUTH EVERY DAY AS NEEDED (Patient not taking: Reported on 10/09/2021)    MICROLET LANCETS MISC 1 each by Other  route. As directed    nitrofurantoin (MACRODANTIN) 50 MG capsule Take 50 mg by mouth daily. 10/09/2021: Has not started   nystatin (MYCOSTATIN/NYSTOP) powder Apply 1 application topically 3 (three) times daily. (Patient taking differently: Apply 1 application  topically daily as needed (Rash).)    ondansetron (ZOFRAN) 4 MG tablet TAKE ONE TABLET BY MOUTH every EIGHT hours AS NEEDED FOR NAUSEA AND VOMITING    OXYGEN Inhale 2 L into the lungs continuous.    pantoprazole (PROTONIX) 40 MG tablet TAKE ONE TABLET BY MOUTH EVERY MORNING    polyethylene glycol (MIRALAX / GLYCOLAX) packet Take 17 g by mouth daily as needed for mild constipation.    potassium chloride (MICRO-K) 10 MEQ CR capsule Take 5 capsules (50 mEq total)  by mouth 2 (two) times daily.    revefenacin (YUPELRI) 175 MCG/3ML nebulizer solution Inhale one vial in nebulizer once daily. Do not mix with other nebulized medications.    rOPINIRole (REQUIP) 4 MG tablet TAKE 1 TABLET BY MOUTH TWICE DAILY    spironolactone (ALDACTONE) 25 MG tablet TAKE ONE TABLET BY MOUTH ONCE DAILY    traMADol (ULTRAM) 50 MG tablet Take 1 tablet (50 mg total) by mouth 3 (three) times daily as needed.    TRULICITY 4.5 KG/8.1EH SOPN INJECT 4.5 MG ONCE WEEKLY    UNABLE TO FIND CPAP- At bedtime    No facility-administered encounter medications on file as of 10/30/2021.    BP Readings from Last 3 Encounters:  10/10/21 (!) 131/54  09/22/21 120/74  08/27/21 118/70    Lab Results  Component Value Date   HGBA1C 9.0 (A) 05/14/2021      No OVs, Consults, or hospital visits since last care coordination call / Pharmacist visit. No medication changes indicated   Last adherence delivery date:10/09/21      Patient is due for next adherence delivery on: 11/10/21  Spoke with patient on 10/30/21 reviewed medications and coordinated delivery.  This delivery to include: Adherence Packaging  30 Days  Packs: Bupropion HCL XL 338m- take 1 tablet at evening meal Cholecalciferol (vit D3)-5000units- take 1 tablet breakfast every other day  Ropinirole 41m take 1 tablet breakfast 1 tablet bedtime  Pantoprazole 4065mtake 1 tablet bedtime Spironolactone 76m66make 1 tablet breakfast Furosemide 20mg85me 3 tablets breakfast Potassium chloride 10 meq ER- take 10 tablets at lunchtime Duloxetine 60mg-54me 1 tablet breakfast Ferrous Sulfate 376mg- 3m 1 tablet evening meal every other day  Gabapentin 300mg-ta67m tablet bedtime Cetirizine 10mg- ta83m tablet daily as needed Aspirin 81mg -tak54mtablet at breakfast  VIAL medications: Albuterol  108 mcg/act inhaler- 2 puffs into lungs every 6 hours as needed   Patient declined the following medications this month: [facility]ul[facility] 4.5mg-  [facility]   once [facility] in pm now  [place] [facility] [facility]                injecting qhs now    Any concerns about your medications? No  How often do you forget or accidentally miss a dose? Rarely  Do you use a pillbox? No  Is patient in packaging Yes  What is the date on your next pill pack?10/29/21  Any concerns or issues with your packaging? No concerns.   Refills requested from providers include:  Bupropion  Confirmed delivery date of 11/10/21, advised patient that pharmacy will contact them the morning of delivery.  Recent blood pressure readings are as follows:none available   Recent blood glucose readings are  as follows:none available   Annual wellness visit in last year? Yes Most Recent BP reading:120/74  If Diabetic: Most recent A1C reading:9.0 Last eye exam / retinopathy screening:UTD Last diabetic foot exam: UTD  Cycle dispensing form sent to Margaretmary Dys, PTM for review.  [name], CPP notified  [name], [place]  [phone]

## 2021-10-30 NOTE — Discharge Instructions (Addendum)
The urinalysis did show some white blood cells  Urine culture is sent, and staff will call you if it looks like your antibiotic needs to be changed  Take cephalexin 250 mg--1 capsule 3 times daily for 7 days   You have been swabbed for COVID, and the test will result in the next 24 hours. Our staff will call you if positive. If the test is positive, you should quarantine for 5 days from the start of your symptoms

## 2021-10-30 NOTE — ED Provider Notes (Signed)
[facility]    CSN: [id number] Arrival date & time: 10/30/21  1349      History   Chief Complaint Chief Complaint  Patient presents with   Fatigue    Entered by patient   Generalized Body Aches   Shortness of Breath    HPI Kathleen Hatfield is a 66 y.o. female.    Shortness of Breath  Here for a 1 day history of myalgia, mild increase in her shortness of breath, and some lower abdominal crampiness.  She has also had a little pain in her low back.  No cough or cold symptoms.  And no sore throat.  She also feels that she is having trouble focusing.  She states the symptoms are typical of when she has a UTI.  She usually does not have typical dysuria or frequency when she has a UTI.  Her chart has allergies to antibiotics listed as Ceclor which causes nausea and vomiting.  She is also allergic to sulfa and Cipro and Levaquin.   Past Medical History:  Diagnosis Date   Anemia    Arthritis    Asthma    Chronic diastolic CHF 49/1791   Echocardiogram 05/2019: EF 70, no RWMA, mild LVH, Gr 1 DD, normal RVSF, severe LVH, borderline asc Aorta (39 mm)   CKD (chronic kidney disease)    Depression    Diabetes mellitus without complication (HCC)    Diverticulitis    Dyspnea    with exertion   GERD (gastroesophageal reflux disease)    History of blood transfusion    Hyperlipidemia    cannot tolerate statins   Hypertension    patient states she has never had high blood pressure.    Nuclear stress test    Myoview 05/2019: EF 83, no ischemia or infarction; Low Risk   Peripheral neuropathy    Pneumonia    PONV (postoperative nausea and vomiting)    RLS (restless legs syndrome)    Sleep apnea    uses CPAP   Ulcerative colitis [facility])    dr Collene Mares    Patient Active Problem List   Diagnosis Date Noted   Aortic atherosclerosis ([place]) 06/04/2021   Preoperative cardiovascular examination 06/04/2021   Hyperglycemia 02/14/2021   Rash 09/27/2020   COPD exacerbation ([place])  09/21/2020   Acute exacerbation of COPD with asthma ([place]) 09/19/2020   Mixed diabetic hyperlipidemia associated with type 2 diabetes mellitus ([place]) 09/19/2020   Pain of foot 06/20/2020   Medicare annual wellness visit, initial 06/20/2020   Constipation 06/11/2020   Diverticulosis of colon 06/11/2020   Irritable bowel syndrome 50/56/9794   Periumbilical pain 80/16/5537   RLS (restless legs syndrome)    Asthma exacerbation 01/08/2020   Muscle weakness 01/08/2020   Grade I diastolic dysfunction 48/27/0786   Drug-induced myopathy 10/10/2019   Advance care planning 06/14/2019   RLQ abdominal pain 06/14/2019   COPD (chronic obstructive pulmonary disease) ([place]) 06/14/2019   Leukocytosis 06/14/2019   Hypercalcemia 06/14/2019   Gout 06/14/2019   Type 2 diabetes mellitus with hyperglycemia, without long-term current use of insulin (HCC)    COPD with acute exacerbation ([place]) 05/03/2017   Diabetes mellitus without complication (HCC)    CKD (chronic kidney disease) stage 3, GFR 30-59 ml/min (HCC)    Hypersomnia with sleep apnea 09/16/2015   Fatigue 06/07/2015   Morbid obesity ([place]) 06/07/2015   (HFpEF) heart failure with preserved ejection fraction (HCC)    Pneumonia of both lower lobes due to infectious organism 02/09/2013  OSA on CPAP 08/23/2012   Essential hypertension    Depression    GERD without esophagitis    Hyperlipidemia    Ulcerative colitis ([place])    Patellar tendinitis 05/25/2011   Knee pain 05/25/2011   Myofascial pain 05/25/2011   Cervicalgia 05/25/2011    Past Surgical History:  Procedure Laterality Date   ABDOMINAL HYSTERECTOMY     APPENDECTOMY     BIOPSY  10/10/2021   Procedure: BIOPSY;  Surgeon: Carol Ada, MD;  Location: [facility];  Service: Gastroenterology;;   BREAST BIOPSY Left    BREAST SURGERY     left biopsy   CARDIAC CATHETERIZATION     CESAREAN SECTION     CHOLECYSTECTOMY     COLONOSCOPY WITH PROPOFOL N/A 10/10/2021   Procedure: COLONOSCOPY WITH  PROPOFOL;  Surgeon: Carol Ada, MD;  Location: [facility];  Service: Gastroenterology;  Laterality: N/A;   HERNIA REPAIR     INSERTION OF MESH N/A 11/15/2015   Procedure: INSERTION OF MESH;  Surgeon: [name], MD;  Location: [facility]S;  Service: General;  Laterality: N/A;   LAPAROSCOPIC LYSIS OF ADHESIONS N/A 11/15/2015   Procedure: LAPAROSCOPIC LYSIS OF ADHESIONS;  Surgeon: [name], MD;  Location: [facility];  Service: General;  Laterality: N/A;   RIGHT HEART CATHETERIZATION N/A 02/27/2013   Procedure: RIGHT HEART CATH;  Surgeon: Blane Ohara, MD;  Location: [facility] CATH LAB;  Service: Cardiovascular;  Laterality: N/A;   RIGHT HEART CATHETERIZATION N/A 12/14/2013   Procedure: RIGHT HEART CATH;  Surgeon: Larey Dresser, MD;  Location: [facility] CATH LAB;  Service: Cardiovascular;  Laterality: N/A;   SIGMOIDECTOMY  2010   diverticular disease   VENTRAL HERNIA REPAIR N/A 11/15/2015   Procedure: LAPAROSCOPIC VENTRAL WALL HERNIA REPAIR;  Surgeon: [name], MD;  Location: [facility];  Service: General;  Laterality: N/A;    OB History     Gravida  2   Para  1   Term      Preterm      AB  1   Living         SAB      IAB      Ectopic      Multiple      Live Births               Home Medications    Prior to Admission medications   Medication Sig Start Date End Date Taking? Authorizing Provider  cephALEXin (KEFLEX) 250 MG capsule Take 1 capsule (250 mg total) by mouth 3 (three) times daily for 7 days. 10/30/21 11/06/21 Yes Stephaniemarie Stoffel, Gwenlyn Perking, MD  acetaminophen (TYLENOL) 500 MG tablet Take 500 mg by mouth every 6 (six) hours as needed for moderate pain.    [provider]  albuterol (VENTOLIN HFA) 108 (90 Base) MCG/ACT inhaler Inhale 2 puffs into the lungs every 6 (six) hours as needed. 06/05/21   [name], MD  Alirocumab (PRALUENT) 150 MG/ML SOAJ Inject 1 pen. into the skin every 14 (fourteen) days. 07/02/21   Supple, Megan E, RPH-CPP  aspirin EC 81 MG  tablet Take 1 tablet (81 mg total) by mouth daily. Swallow whole. 06/04/21   Richardson Dopp T, PA-C  BAYER CONTOUR NEXT TEST test strip 1 each by Other route daily. As directed 07/30/15   [provider]  budesonide (PULMICORT) 0.5 MG/2ML nebulizer solution Take 2 mLs (0.5 mg total) by nebulization 2 (two) times daily. 05/23/20   [name], MD  buPROPion (WELLBUTRIN XL)  300 MG 24 hr tablet TAKE ONE TABLET BY MOUTH EVERY MORNING 10/28/21   [name], MD  cetirizine (ZYRTEC) 10 MG tablet Take 10 mg by mouth at bedtime.    [provider]  Cholecalciferol (VITAMIN D-3) 5000 UNITS TABS Take 5,000 Units by mouth every other day.     [provider]  colchicine 0.6 MG tablet TAKE 1 TABLET (0.6 MG TOTAL) BY MOUTH DAILY AS NEEDED (FOR GOUT). 05/19/21   [name], MD  Continuous Blood Gluc Receiver ([place]) [place] by Does not apply route.    [provider]  Continuous Blood Gluc Sensor ([place]) MISC by Does not apply route. Apply sensor every 10 days    [provider]  dicyclomine (BENTYL) 20 MG tablet TAKE 1 TABLET UP TO FOUR TIMES A DAY FOR GI CRAMPING, PAIN, NAUSEA, AND VOMITING AS NEEDED 03/14/21   [name], MD  DULoxetine (CYMBALTA) 60 MG capsule TAKE 1 CAPSULE BY MOUTH ONCE DAILY. 03/28/21   [name], MD  estradiol (ESTRACE) 0.1 MG/GM vaginal cream Place 1 Applicatorful vaginally 3 (three) times a week.    [provider]  fenofibrate (TRICOR) 48 MG tablet Take 1 tablet (48 mg total) by mouth daily. 10/27/21   [name], MD  fluticasone [facility]) 50 MCG/ACT nasal spray Place 2 sprays into both nostrils daily. Patient taking differently: Place 2 sprays into both nostrils as needed. 02/08/21   Deatra Canter, Amjad, PA-C  formoterol (PERFOROMIST) 20 MCG/2ML nebulizer solution Take 2 mLs (20 mcg total) by nebulization 2 (two) times daily. 08/27/21   [name], MD  furosemide (LASIX) 20 MG tablet Take 3 tablets  (60 mg total) by mouth daily. 09/24/21   [name], MD  gabapentin (NEURONTIN) 300 MG capsule Take 1 capsule (300 mg total) by mouth at bedtime. 05/23/21   [name], MD  GLOBAL EASE INJECT PEN NEEDLES 32G X 4 MM MISC USE AS DIRECTED WITH VICTOZA AND TRESIBA. 08/12/20   [name], MD  insulin degludec (TRESIBA FLEXTOUCH) 200 UNIT/ML FlexTouch Pen Inject 120 Units into the skin daily.    [provider]  ipratropium-albuterol (DUONEB) 0.5-2.5 (3) MG/3ML SOLN Take 3 mLs by nebulization every 6 (six) hours as needed (wheezing). 02/10/21   [name], MD  Iron, Ferrous Sulfate, 325 (65 Fe) MG TABS Take 325 mg by mouth every other day. Patient taking differently: Take 325 mg by mouth 3 (three) times a week. 04/14/21   [name], MD  meloxicam (MOBIC) 15 MG tablet Take 7.5 mg by mouth daily as needed for pain.    [provider]  metolazone (ZAROXOLYN) 5 MG tablet TAKE 1 TABLET BY MOUTH EVERY DAY AS NEEDED Patient not taking: Reported on 10/09/2021 10/08/21   [name], MD  MICROLET LANCETS MISC 1 each by Other route. As directed 07/30/15   [provider]  nitrofurantoin (MACRODANTIN) 50 MG capsule Take 50 mg by mouth daily. 10/08/21   [provider]  nystatin (MYCOSTATIN/NYSTOP) powder Apply 1 application topically 3 (three) times daily. Patient taking differently: Apply 1 application  topically daily as needed (Rash). 09/26/20   [name], MD  ondansetron (ZOFRAN) 4 MG tablet TAKE ONE TABLET BY MOUTH every EIGHT hours AS NEEDED FOR NAUSEA AND VOMITING 06/11/21   [name], MD  OXYGEN Inhale 2 L into the lungs continuous.    [provider]  pantoprazole (PROTONIX) 40 MG tablet TAKE ONE  TABLET BY MOUTH EVERY MORNING 08/28/21   [name], MD  polyethylene glycol [facility] Floria Raveling) packet Take 17 g by mouth daily as needed for mild constipation.    [provider]  potassium chloride (MICRO-K) 10 MEQ CR  capsule Take 5 capsules (50 mEq total) by mouth 2 (two) times daily. 09/26/21   [name], MD  revefenacin [facility]) 175 MCG/3ML nebulizer solution Inhale one vial in nebulizer once daily. Do not mix with other nebulized medications. 05/26/21   [name], MD  rOPINIRole (REQUIP) 4 MG tablet TAKE 1 TABLET BY MOUTH TWICE DAILY 03/28/21   [name], MD  spironolactone (ALDACTONE) 25 MG tablet TAKE ONE TABLET BY MOUTH ONCE DAILY 08/28/21   [name], MD  traMADol (ULTRAM) 50 MG tablet Take 1 tablet (50 mg total) by mouth 3 (three) times daily as needed. 06/18/20   [name], MD  TRULICITY 4.5 GU/4.4IH SOPN INJECT 4.5 MG ONCE WEEKLY 09/11/21   [name], MD  UNABLE TO FIND CPAP- At bedtime    [provider]    Family History Family History  Problem Relation Age of Onset   Heart disease Mother    Hypertension Mother    Dementia Mother    Heart disease Father    COPD Father    Hypertension Father    Heart disease Brother    Other Brother        knee replacement; hip replacement   Other Brother        cant brathe when laying down   Diabetes Maternal Grandmother    Breast cancer Neg Hx    Colon cancer Neg Hx     Social History Social History   Tobacco Use   Smoking status: Never   Smokeless tobacco: Never  Vaping Use   Vaping Use: Never used  Substance Use Topics   Alcohol use: No   Drug use: No     Allergies   Almond oil, Morphine and related, Atorvastatin, Ceclor [cefaclor], Sulfa antibiotics, Ciprofloxacin, Levaquin [levofloxacin], Losartan, and Statins   Review of Systems Review of Systems  Respiratory:  Positive for shortness of breath.      Physical Exam Triage Vital Signs ED Triage Vitals  Enc Vitals Group     BP 10/30/21 1409 134/86     Pulse Rate 10/30/21 1409 88     Resp 10/30/21 1409 18     Temp 10/30/21 1409 98.3 F (36.8 C)     Temp Source 10/30/21 1409 Oral     SpO2 10/30/21 1409 92 %     Weight --       Height --      Head Circumference --      Peak Flow --      Pain Score 10/30/21 1408 5     Pain Loc --      Pain Edu? --      Excl. in [place]? --    No data found.  Updated Vital Signs BP 134/86 (BP Location: Left Arm)   Pulse 88   Temp 98.3 F (36.8 C) (Oral)   Resp 18   SpO2 92%   Visual Acuity Right Eye Distance:   Left Eye Distance:   Bilateral Distance:    Right Eye Near:   Left Eye Near:    Bilateral Near:     Physical Exam Vitals reviewed.  Constitutional:      General: She is not in acute distress.  Appearance: She is not ill-appearing, toxic-appearing or diaphoretic.  HENT:     Mouth/Throat:     Mouth: Mucous membranes are moist.     Pharynx: No oropharyngeal exudate or posterior oropharyngeal erythema.  Eyes:     Extraocular Movements: Extraocular movements intact.     Conjunctiva/sclera: Conjunctivae normal.     Pupils: Pupils are equal, round, and reactive to light.  Cardiovascular:     Rate and Rhythm: Normal rate and regular rhythm.     Heart sounds: No murmur heard. Pulmonary:     Effort: Pulmonary effort is normal.     Breath sounds: Normal breath sounds.  Abdominal:     Palpations: Abdomen is soft. There is no mass.     Comments: Be a little tenderness in the suprapubic area  Musculoskeletal:     Cervical back: Neck supple.  Lymphadenopathy:     Cervical: No cervical adenopathy.  Skin:    Coloration: Skin is not jaundiced or pale.     Findings: No rash.  Neurological:     General: No focal deficit present.     Mental Status: She is alert and oriented to person, place, and time.  Psychiatric:        Behavior: Behavior normal.      UC Treatments / Results  Labs (all labs ordered are listed, but only abnormal results are displayed) Labs Reviewed  POCT URINALYSIS DIP (MANUAL ENTRY) - Abnormal; Notable for the following components:      Result Value   Protein Ur, POC =30 (*)    Leukocytes, UA Small (1+) (*)    All other components  within normal limits  SARS CORONAVIRUS 2 (TAT 6-24 HRS)  URINE CULTURE    EKG   Radiology No results found.  Procedures Procedures (including critical care time)  Medications Ordered in UC Medications - No data to display  Initial Impression / Assessment and Plan / UC Course  I have reviewed the triage vital signs and the nursing notes.  Pertinent labs & imaging results that were available during my care of the patient were reviewed by me and considered in my medical decision making (see chart for details).     Her urinalysis does show some white cells.  We will culture the urine, and I am going to send in Keflex which she has tolerated in the past.  We will also do a COVID swab, and if positive she is at risk for severe disease, then she have a prescription of molnupiravir Final Clinical Impressions(s) / UC Diagnoses   Final diagnoses:  Pyuria  Myalgia     Discharge Instructions      The urinalysis did show some white blood cells  Urine culture is sent, and staff will call you if it looks like your antibiotic needs to be changed  Take cephalexin 250 mg--1 capsule 3 times daily for 7 days   You have been swabbed for COVID, and the test will result in the next 24 hours. Our staff will call you if positive. If the test is positive, you should quarantine for 5 days from the start of your symptoms       ED Prescriptions     Medication Sig Dispense Auth. Provider   cephALEXin (KEFLEX) 250 MG capsule Take 1 capsule (250 mg total) by mouth 3 (three) times daily for 7 days. 21 capsule Barrett Henle, MD      PDMP not reviewed this encounter.   Barrett Henle, MD 10/30/21 [phone]

## 2021-10-30 NOTE — Telephone Encounter (Signed)
Can you please triage this patient and schedule an appt with someone if able?

## 2021-10-30 NOTE — Telephone Encounter (Signed)
Noted. Thanks. Will await UC notes.

## 2021-10-30 NOTE — Telephone Encounter (Signed)
-----   Message from [place], [place] sent at 10/30/2021 11:08 AM EDT ----- Regarding: patient not feeling well   Good morning, I was going over the medications with this patient and she told me she was not feeling well at all today, shaky, joints ache all over, her BG is in normal limits. I told her I would get a message to Dr.Duncans team. Thanks for any assistance with this    [name], [place]  [phone]

## 2021-10-31 LAB — URINE CULTURE: Culture: 30000 — AB

## 2021-10-31 LAB — SARS CORONAVIRUS 2 (TAT 6-24 HRS): SARS Coronavirus 2: NEGATIVE

## 2021-10-31 NOTE — Patient Instructions (Signed)
Visit Information  Phone number for Pharmacist: [phone]   Goals Addressed   None     Care Plan : [place]  Updates made by [name], [facility] since 10/31/2021 12:00 AM     Problem: Hypertension, Hyperlipidemia, Diabetes, Heart Failure, COPD, Asthma, Chronic Kidney Disease, and Depression   Priority: High     Long-Range Goal: Disease Management   Start Date: 07/29/2020  Expected End Date: 05/24/2022  This Visit's Progress: On track  Recent Progress: On track  Priority: High  Note:   Current Barriers:  Unable to achieve control of diabetes, COPD  Pharmacist Clinical Goal(s):  Patient will contact provider office for questions/concerns as evidenced notation of same in electronic health record through collaboration with PharmD and provider.   Interventions: 1:1 collaboration with [name], MD regarding development and update of comprehensive plan of care as evidenced by provider attestation and co-signature Inter-disciplinary care team collaboration (see longitudinal plan of care) Comprehensive medication review performed; medication list updated in electronic medical record  Hyperlipidemia: (LDL goal < 70) -Uncontrolled - LDL 76 (10/2021), improved from 208 (06/2021) with Praluent; TRIG 504 likely related to uncontrolled DM -High ASCVD risk (25%) -Current treatment: Praluent 150 mg q 14 days - Appropriate, Effective, Safe, Accessible Fenofibrate 48 mg daily ? -Medications previously tried: Multiple statins (atorvastatin, others) - leg weakness; Vascepa (cost) -Educated on Cholesterol goals; benefits of Prauluent -Recommend to continue current medication; follow up with cardiology for lab work as scheduled  Diabetes (A1c goal <7%) -Uncontrolled, improving- A1c 9.0 (05/2021), pt endorses improvement in BG on higher dose of insulin and Trulicity.  -Follows with Dr [name] (established care 05/2021). Dx 2017, insulin since 2020. Hx of chronic UTIs,  would avoid SGLT2-inhibitor -Current home glucose readings: via Dexcom G7; sensor has been off for a day and she cannot provide any readings as she can only find 24-hr of info with her reader -Current medications: Trulicity 4.5 mg weekly (Thurs) - Appropriate, Query Effective Tresiba 120 units daily (midday) -Appropriate, Query Effective Dexcom G7 w/ reader - Appropriate, Effective, Safe, Accessible -Medications previously tried: Jardiance, Victoza, metformin -Current meal patterns: The patient reports she is on a salt free, sugar free, gluten free diet but had a hard time sticking to this. She tries to limit portions.  -Recommend to continue current medication  Heart Failure (Goal: manage symptoms and prevent exacerbations) -Controlled - per pt report of symptoms. She has not been checking her weight regularly; pt has previously had trouble swallowing large tablets but does well with Potassium 10 mEq capsules.  -Followed by Dr. [name] with [facility] -Last ejection fraction: 70% (Date: 03/21) -HF type: Diastolic; NYHA Class: II (slight limitation of activity) -Current treatment: Spironolactone 25 mg daily AM - Appropriate, Effective, Safe, Accessible Furosemide 20 mg - 3 tab AM + 3 tab PRN- Appropriate, Effective, Safe, Accessible Potassium 10 mEq - 4 AM, 2 PM, 4 HS - Appropriate, Effective, Safe, Accessible Metolazone 2.5 mg PRN wt gain - Appropriate, Effective, Safe, Accessible -Medications previously tried: torsemide, bumetanide, losartan  -Recommended to continue current medication;  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) -Improving - pt has had multiple hospitalizations for COPD exacerbation in 2022; she is now taking her nebulizers as directed and reports doing better overall, using Duoneb and albuterol rescue less often; she wears Oxygen at night and PRN during the day;  -Followed by Dr Halford Chessman, [facility] Pulmonology. Pt does not follow up  with pulmonology  as frequently as recommended.  -Pulmonary function testing: 2021 FEV1 42% predicted -Gold Grade: Gold 3 (FEV1 30-49%) -Current COPD Classification:  E (high sx, >2 exacerbations/yr) -Exacerbations requiring treatment in last 6 months: yes, multiple -Compliant with CPAP nightly -Current regimen: Pulmicort nebuilzer BID - Appropriate, Effective, Safe, Accessible Yupelri nebulizer daily -Appropriate, Effective, Safe, Accessible Albuterol/ipratropium nebulizer - takes 2-3 times per day as needed - Appropriate, Effective, Safe, Accessible Albuterol inhaler - 2 puffs every 6 hours PRN - Appropriate, Effective, Safe, Accessible -Medications previously tried: per pt inhalers less effective than nebulizers, Perforomist was unaffordable; She was previously on Advair, Incruse and most recently Trelegy but says she has stopped these because she does not like the powder inhalers. -Recommended to continue current medication; follow up with pulmonology more regularly  Depression/Anxiety/Pain(Goal: Control symptoms) -Controlled, stable per patient; she asked about changing gabapentin to 300 mg dose due to pill burden -Current treatment: Bupropion 300 mg daily - Appropriate, Effective, Safe, Accessible Cymbalta 60 mg daily - Appropriate, Effective, Safe, Accessible Gabapentin 100 mg - 3 caps HS - Appropriate, Effective, Safe, Accessible Ropinirole 4 mg BID (RLS) - Appropriate, Effective, Query Safe -Medications previously tried/failed: none -Concern that ropinirole may be causing her to fall asleep suddenly during the day. This has been discussed with patient before and per chart records, she was unable to tolerate dose reductions. We will revisit this issue periodically -Recommend to continue current medication  Medication management (Goal: improve adherence) Pill Packs (next delivery 11/10/21) Bupropion HCL XL 356m- take 1 tablet at evening meal Cholecalciferol (vit D3)-5000units- take 1  tablet breakfast every other day  Ropinirole 46m take 1 tablet breakfast 1 tablet bedtime  Pantoprazole 4011mtake 1 tablet bedtime Spironolactone 2m98make 1 tablet breakfast Furosemide 20mg51me -3 tablets breakfast Potassium chloride 10 meq ER- take 10 tablets at lunchtime Duloxetine 60mg-74me 1 tablet breakfast Ferrous Sulfate 32mg- 22m 1 tablet evening meal every other day  Gabapentin 300mg-ta10m tablet bedtime Cetirizine 10mg- ta53m tablet daily as needed Aspirin 81mg -tak32mtablet at breakfast    Patient declined the following medications this month: Trulicity 4.5mg- [facility]m [facility]   once weekly in pm now  [place] [facility]                injecting qhs now   Patient Goals/Self-Care Activities Patient will:  - take medications as prescribed as evidenced by patient report and record review focus on medication adherence by PILL PACKS check glucose daily, document, and provide at future appointments check blood pressure daily, document, and provide at future appointments       Patient verbalizes understanding of instructions and care plan provided today and agrees to view in MyChart. A[place]overdaleyChart status and patient understanding of how to access instructions and care plan via MyChart confirmed with patient.    Telephone follow up appointment with pharmacy team member scheduled for: 3 months  [place]a Ponder FoCharlene BrookeBCACP ClinMizell Memorial HospitalPharmacist [place] [facility]Fair Havenare at [facility] [phone]

## 2021-11-03 ENCOUNTER — Other Ambulatory Visit: Payer: Self-pay | Admitting: Family Medicine

## 2021-11-06 DIAGNOSIS — J449 Chronic obstructive pulmonary disease, unspecified: Secondary | ICD-10-CM

## 2021-11-06 DIAGNOSIS — E782 Mixed hyperlipidemia: Secondary | ICD-10-CM | POA: Diagnosis not present

## 2021-11-06 DIAGNOSIS — Z794 Long term (current) use of insulin: Secondary | ICD-10-CM

## 2021-11-06 DIAGNOSIS — E1165 Type 2 diabetes mellitus with hyperglycemia: Secondary | ICD-10-CM | POA: Diagnosis not present

## 2021-11-06 DIAGNOSIS — I509 Heart failure, unspecified: Secondary | ICD-10-CM | POA: Diagnosis not present

## 2021-11-07 ENCOUNTER — Ambulatory Visit (INDEPENDENT_AMBULATORY_CARE_PROVIDER_SITE_OTHER): Payer: Medicare HMO | Admitting: Family Medicine

## 2021-11-07 ENCOUNTER — Encounter: Payer: Self-pay | Admitting: Family Medicine

## 2021-11-07 VITALS — BP 112/74 | HR 93 | Temp 97.0°F | Ht 65.0 in | Wt 265.0 lb

## 2021-11-07 DIAGNOSIS — G8929 Other chronic pain: Secondary | ICD-10-CM | POA: Diagnosis not present

## 2021-11-07 DIAGNOSIS — Z794 Long term (current) use of insulin: Secondary | ICD-10-CM | POA: Diagnosis not present

## 2021-11-07 DIAGNOSIS — E1165 Type 2 diabetes mellitus with hyperglycemia: Secondary | ICD-10-CM | POA: Diagnosis not present

## 2021-11-07 DIAGNOSIS — M25561 Pain in right knee: Secondary | ICD-10-CM | POA: Diagnosis not present

## 2021-11-07 DIAGNOSIS — E119 Type 2 diabetes mellitus without complications: Secondary | ICD-10-CM | POA: Diagnosis not present

## 2021-11-07 DIAGNOSIS — M25562 Pain in left knee: Secondary | ICD-10-CM

## 2021-11-07 DIAGNOSIS — E782 Mixed hyperlipidemia: Secondary | ICD-10-CM | POA: Diagnosis not present

## 2021-11-07 DIAGNOSIS — G2581 Restless legs syndrome: Secondary | ICD-10-CM

## 2021-11-07 DIAGNOSIS — R5382 Chronic fatigue, unspecified: Secondary | ICD-10-CM

## 2021-11-07 DIAGNOSIS — M79673 Pain in unspecified foot: Secondary | ICD-10-CM | POA: Diagnosis not present

## 2021-11-07 MED ORDER — IRON (FERROUS SULFATE) 325 (65 FE) MG PO TABS: 325.0000 mg | ORAL_TABLET | ORAL | Status: DC

## 2021-11-07 MED ORDER — POTASSIUM CHLORIDE ER 10 MEQ PO CPCR: 50.0000 meq | ORAL_CAPSULE | Freq: Every day | ORAL | Status: DC

## 2021-11-07 MED ORDER — TRAMADOL HCL 50 MG PO TABS: 50.0000 mg | ORAL_TABLET | Freq: Three times a day (TID) | ORAL | 1 refills | Status: DC | PRN

## 2021-11-07 NOTE — Progress Notes (Unsigned)
D/w pt about ucx and recent urinary sx.  Sx resolved so would defer continued abx at this point.    Shaking/jerking, ie tremor.  Noted trouble typing, carrying objects.  No SZ activity.  Noted in the hands.  Worse with standing, some attributed to unsteadiness in the knees/deconditioning.  Noted holding a cup.  No trouble swallowing when sitting upright.  Can walk with a walker around the inside of the house.  Taking 76m requip BID at baseline for RLS- this was longstanding and prev helped.   Short term memory d/w pt.  Mother had dementia.  No red flag events.   Year, month, day wnl.   Math wnl. Can read a watch- wnl.   WORLD----->DLROW 3/3 recall.    DM2. Sugar 312 at OV, just ate prior to eval.  Statin intolerant.  Usually sugar had been controlled/lower at home, ~100-200.  No lows.    Elevated Cholesterol: Using medications without problems: yes Muscle aches: no Diet compliance: d/w pt.   Exercise: limited.   Tolerating praluent.    She had shingles #1 at end of July 2023.    Taking tramadol for knee pain.  It helps.    Still on CPAP for OSA.    Rare metolazone.  Had been taking 562mEq K at baseline.    Meds, vitals, and allergies reviewed.   ROS: Per HPI unless specifically indicated in ROS section   Needs endo and eval with Dr. TaCarles Collet    B hand tremor in extension.

## 2021-11-07 NOTE — Patient Instructions (Addendum)
Take care.  Glad to see you. Go to the lab on the way out.   If you have mychart we'll likely use that to update you.    I would get a flu shot each fall.   Tetanus may be cheaper at the pharmacy.

## 2021-11-08 LAB — COMPREHENSIVE METABOLIC PANEL
AG Ratio: 1.4 (calc) (ref 1.0–2.5)
ALT: 16 U/L (ref 6–29)
AST: 18 U/L (ref 10–35)
Albumin: 4 g/dL (ref 3.6–5.1)
Alkaline phosphatase (APISO): 91 U/L (ref 37–153)
BUN/Creatinine Ratio: 13 (calc) (ref 6–22)
BUN: 19 mg/dL (ref 7–25)
CO2: 30 mmol/L (ref 20–32)
Calcium: 10.2 mg/dL (ref 8.6–10.4)
Chloride: 94 mmol/L — ABNORMAL LOW (ref 98–110)
Creat: 1.52 mg/dL — ABNORMAL HIGH (ref 0.50–1.05)
Globulin: 2.8 g/dL (calc) (ref 1.9–3.7)
Glucose, Bld: 285 mg/dL — ABNORMAL HIGH (ref 65–99)
Potassium: 4.5 mmol/L (ref 3.5–5.3)
Sodium: 138 mmol/L (ref 135–146)
Total Bilirubin: 0.3 mg/dL (ref 0.2–1.2)
Total Protein: 6.8 g/dL (ref 6.1–8.1)

## 2021-11-08 LAB — CBC WITH DIFFERENTIAL/PLATELET
Absolute Monocytes: 809 cells/uL (ref 200–950)
Basophils Absolute: 74 cells/uL (ref 0–200)
Basophils Relative: 0.7 %
Eosinophils Absolute: 294 cells/uL (ref 15–500)
Eosinophils Relative: 2.8 %
HCT: 40.5 % (ref 35.0–45.0)
Hemoglobin: 13.5 g/dL (ref 11.7–15.5)
Lymphs Abs: 3045 cells/uL (ref 850–3900)
MCH: 29.7 pg (ref 27.0–33.0)
MCHC: 33.3 g/dL (ref 32.0–36.0)
MCV: 89.2 fL (ref 80.0–100.0)
MPV: 10.1 fL (ref 7.5–12.5)
Monocytes Relative: 7.7 %
Neutro Abs: 6279 cells/uL (ref 1500–7800)
Neutrophils Relative %: 59.8 %
Platelets: 325 10*3/uL (ref 140–400)
RBC: 4.54 10*6/uL (ref 3.80–5.10)
RDW: 14.2 % (ref 11.0–15.0)
Total Lymphocyte: 29 %
WBC: 10.5 10*3/uL (ref 3.8–10.8)

## 2021-11-08 LAB — HEMOGLOBIN A1C
Hgb A1c MFr Bld: 7.6 % of total Hgb — ABNORMAL HIGH (ref <5.7)
Mean Plasma Glucose: 171 mg/dL
eAG (mmol/L): 9.5 mmol/L

## 2021-11-08 LAB — IRON: Iron: 54 ug/dL (ref 45–160)

## 2021-11-08 LAB — TSH: TSH: 3.14 mIU/L (ref 0.40–4.50)

## 2021-11-11 ENCOUNTER — Ambulatory Visit: Payer: Medicare HMO | Admitting: Neurology

## 2021-11-11 DIAGNOSIS — E1165 Type 2 diabetes mellitus with hyperglycemia: Secondary | ICD-10-CM | POA: Diagnosis not present

## 2021-11-11 NOTE — Assessment & Plan Note (Signed)
Continue as needed tramadol.

## 2021-11-11 NOTE — Assessment & Plan Note (Signed)
History of tremor, RLS.  RLS had previously improved on higher dose of Requip previously.  This is longstanding dose.  I need neurology input, refer back to Dr. Carles Collet.

## 2021-11-11 NOTE — Assessment & Plan Note (Signed)
No change in medication at this point.  Refer back to endocrinology.  See notes on labs.

## 2021-11-11 NOTE — Assessment & Plan Note (Signed)
Continue Praluent.

## 2021-11-19 DIAGNOSIS — J449 Chronic obstructive pulmonary disease, unspecified: Secondary | ICD-10-CM | POA: Diagnosis not present

## 2021-11-24 NOTE — Progress Notes (Unsigned)
Assessment/Plan:   1.  Tremor.             -Very little tremor was noted on examination today.  She admits that tremor seems to come and go.  Discussed with her that I continue to wonder if medications for her COPD are not driving her tremor.  Do not see any significant degree of essential tremor.   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  When I saw her last visit, she was on a beta-blocker and does not know.  I wonder if that is why tremor got a bit worse.  I would not want to put her back on that because of potentially worsening lung function.  Discussed with her that if her lung medications were driving tremor, then other medications to suppress tremor really do not help.  She would like to go ahead and try low-dose primidone and see if it is of any benefit.  We gave her a prescription for primidone, 50 mg, half tablet at bedtime for 1 week and then increase to 1 tablet thereafter.  2.  Restless leg  -Patient really did not mention this at all to me today, but read in her notes following the visit that PCP was struggling with treatment.  As discussed several years ago, my suspicion is that augmentation from high-dose ropinirole is the biggest issue we are facing here.  She is on a dose that is more appropriate for Parkinson's than restless leg.  If she is going to get good control on restless leg, the dose of ropinirole will need to be decreased.  This can be a difficult process, with restless leg symptoms increasing before they decrease.  However, there really is not a better way to get restless leg under better control.  She would need to discuss this with primary care physician, who is prescribing the ropinirole.  If they decide to undertake this, I would recommend that they trial decreasing the ropinirole to 2 mg in the morning and 4 mg in the evening for 2 weeks and then decrease to 2 mg twice per day for 2 weeks and then only 2 mg at bedtime.  At the same time, they could trial  increasing the gabapentin to compensate to 300 mg twice per day.  As above, she did not mention restless leg to me, so this is just provided for a template for primary care to consider, since I noticed this in his notes. Subjective:   Kathleen Hatfield was seen today in follow up for tremor.  Pt with husband who supplements hx.  I have not seen the patient in 2.5 years.  Regarding tremor, we discussed last visit that 1 cannot diagnose essential tremor while 1 is on tremor inducing medications, and she is on multiple of these for COPD.  She also had tremor qualities consistent with FND.  Patient c/o tremor in the R hand and a jerking on the L hand.  Has trouble writing b/c of it.  She has trouble holding water.  She is not sure how long she has been off of the metoprolol or why it was d/c.  She thinks it was d/c by a doc in [place] who is retired.  She has better and worse days in terms of tremor, but it is always present.    When I last saw her, I thought she was getting augmentation from high-dose ropinirole (she was on Requip, 4 mg, 1 tablet in the morning and  1.5 tablet in the evening).  We also identified that her ferritin was a little low and told her to take ferrous sulfate, 325 mg daily for 3 months along with vitamin C.  We have not seen her since that time.  She last saw primary care in September 1.  Issues with restless leg continued.  She is on ropinirole, 4 mg twice per day.  She is on gabapentin, 300 mg at bedtime.     CURRENT MEDICATIONS:  Outpatient Encounter Medications as of 11/25/2021  Medication Sig   acetaminophen (TYLENOL) 500 MG tablet Take 500 mg by mouth every 6 (six) hours as needed for moderate pain.   albuterol (VENTOLIN HFA) 108 (90 Base) MCG/ACT inhaler Inhale 2 puffs into the lungs every 6 (six) hours as needed.   Alirocumab (PRALUENT) 150 MG/ML SOAJ Inject 1 pen. into the skin every 14 (fourteen) days.   aspirin EC 81 MG tablet Take 1 tablet (81 mg total) by mouth daily.  Swallow whole.   BAYER CONTOUR NEXT TEST test strip 1 each by Other route daily. As directed   budesonide (PULMICORT) 0.5 MG/2ML nebulizer solution Take 2 mLs (0.5 mg total) by nebulization 2 (two) times daily.   buPROPion (WELLBUTRIN XL) 300 MG 24 hr tablet TAKE ONE TABLET BY MOUTH EVERY MORNING   cetirizine (ZYRTEC) 10 MG tablet Take 10 mg by mouth at bedtime.   Cholecalciferol (VITAMIN D-3) 5000 UNITS TABS Take 5,000 Units by mouth every other day.    colchicine 0.6 MG tablet TAKE 1 TABLET (0.6 MG TOTAL) BY MOUTH DAILY AS NEEDED (FOR GOUT).   Continuous Blood Gluc Receiver ([place]lin) DEVI by Does not apply route.   Continuous Blood Gluc Sensor (DEXCOM G7 SENSOR) MISC by Does not apply route. Apply sensor every 10 days   dicyclomine (BENTYL) 20 MG tablet TAKE 1 TABLET UP TO FOUR TIMES A DAY FOR GI CRAMPING, PAIN, NAUSEA, AND VOMITING AS NEEDED   DULoxetine (CYMBALTA) 60 MG capsule TAKE 1 CAPSULE BY MOUTH ONCE DAILY.   estradiol (ESTRACE) 0.1 MG/GM vaginal cream Place 1 Applicatorful vaginally 3 (three) times a week.   fenofibrate (TRICOR) 48 MG tablet Take 1 tablet (48 mg total) by mouth daily.   fluticasone (FLONASE) 50 MCG/ACT nasal spray Place 2 sprays into both nostrils daily. (Patient taking differently: Place 2 sprays into both nostrils as needed.)   formoterol (PERFOROMIST) 20 MCG/2ML nebulizer solution Take 2 mLs (20 mcg total) by nebulization 2 (two) times daily.   furosemide (LASIX) 20 MG tablet Take 3 tablets (60 mg total) by mouth daily.   gabapentin (NEURONTIN) 300 MG capsule Take 1 capsule (300 mg total) by mouth at bedtime.   GLOBAL EASE INJECT PEN NEEDLES 32G X 4 MM MISC USE AS DIRECTED WITH VICTOZA AND TRESIBA.   insulin degludec (TRESIBA FLEXTOUCH) 200 UNIT/ML FlexTouch Pen Inject 120 Units into the skin daily.   ipratropium-albuterol (DUONEB) 0.5-2.5 (3) MG/3ML SOLN Take 3 mLs by nebulization every 6 (six) hours as needed (wheezing).   Iron, Ferrous Sulfate, 325 (65  Fe) MG TABS Take 325 mg by mouth 3 (three) times a week.   meloxicam (MOBIC) 15 MG tablet Take 7.5 mg by mouth daily as needed for pain.   metolazone (ZAROXOLYN) 5 MG tablet TAKE 1 TABLET BY MOUTH EVERY DAY AS NEEDED   MICROLET LANCETS MISC 1 each by Other route. As directed   nitrofurantoin (MACRODANTIN) 50 MG capsule Take 50 mg by mouth daily.   nystatin (MYCOSTATIN/NYSTOP) powder  Apply 1 application topically 3 (three) times daily. (Patient taking differently: Apply 1 application  topically daily as needed (Rash).)   ondansetron (ZOFRAN) 4 MG tablet TAKE ONE TABLET BY MOUTH every EIGHT hours AS NEEDED FOR NAUSEA AND VOMITING   OXYGEN Inhale 2 L into the lungs continuous.   pantoprazole (PROTONIX) 40 MG tablet TAKE ONE TABLET BY MOUTH EVERY MORNING   polyethylene glycol (MIRALAX / GLYCOLAX) packet Take 17 g by mouth daily as needed for mild constipation.   potassium chloride (MICRO-K) 10 MEQ CR capsule Take 5 capsules (50 mEq total) by mouth daily.   primidone (MYSOLINE) 50 MG tablet Take 1 tablet (50 mg total) by mouth at bedtime.   revefenacin (YUPELRI) 175 MCG/3ML nebulizer solution Inhale one vial in nebulizer once daily. Do not mix with other nebulized medications.   rOPINIRole (REQUIP) 4 MG tablet TAKE 1 TABLET BY MOUTH TWICE DAILY   spironolactone (ALDACTONE) 25 MG tablet TAKE ONE TABLET BY MOUTH ONCE DAILY   traMADol (ULTRAM) 50 MG tablet Take 1 tablet (50 mg total) by mouth 3 (three) times daily as needed.   TRULICITY 4.5 HB/7.1IR SOPN INJECT 4.5 MG ONCE WEEKLY   UNABLE TO FIND CPAP- At bedtime   No facility-administered encounter medications on file as of 11/25/2021.     Objective:   PHYSICAL EXAMINATION:    VITALS:   Vitals:   11/25/21 1407  BP: 134/78  Pulse: 91  SpO2: 98%  Weight: 264 lb (119.7 kg)  Height: 5' 5"  (1.651 m)  PF: 98 L/min    Gen:  Appears stated age and in NAD. HEENT:  Normocephalic, atraumatic. The mucous membranes are moist. The superficial  temporal arteries are without ropiness or tenderness. Cardiovascular: Regular rate and rhythm. Lungs: There are mild diffuse expiratory wheezes, but otherwise she is clear. Neck: There are no carotid bruits noted bilaterally.   NEUROLOGICAL:   Orientation:  The patient is alert and oriented x 3.   Cranial nerves: There is good facial symmetry. Extraocular muscles are intact and visual fields are full to confrontational testing. Speech is fluent and clear. Soft palate rises symmetrically and there is no tongue deviation. Hearing is intact to conversational tone. Tone: Tone is good throughout. Sensation: Sensation is intact to light touch touch throughout  Coordination:  The patient has no dysdiadichokinesia or dysmetria. Motor: Strength is at least antigravity x 4 Gait and Station: The patient pushes off from the transport chair that she was sitting in.  She is wide based and somewhat unsteady (she attributes to knee issues).  She has most trouble in the turn.     MOVEMENT EXAM: Tremor: There is no rest tremor.  There is almost no postural tremor.  When I first walked into the office, she grabbed her phone and held it in the right hand and showed me the tremulous motion.  There was some entrainment.  She draws Archimedes spiral as well.  Lab Results  Component Value Date   IRON 54 11/07/2021   TIBC 430 09/19/2019   FERRITIN 64.4 11/28/2020     Total time spent on today's visit was 35 minutes, including both face-to-face time and nonface-to-face time.  Time included that spent on review of records (prior notes available to me/labs/imaging if pertinent), discussing treatment and goals, answering patient's questions and coordinating care.  Cc:  [name], MD

## 2021-11-25 ENCOUNTER — Ambulatory Visit: Payer: Medicare HMO | Attending: Cardiovascular Disease

## 2021-11-25 ENCOUNTER — Other Ambulatory Visit: Payer: Medicare HMO

## 2021-11-25 ENCOUNTER — Ambulatory Visit: Payer: Medicare HMO | Admitting: Neurology

## 2021-11-25 ENCOUNTER — Ambulatory Visit: Payer: Medicare HMO | Admitting: Cardiovascular Disease

## 2021-11-25 ENCOUNTER — Telehealth: Payer: Self-pay | Admitting: Family Medicine

## 2021-11-25 ENCOUNTER — Encounter: Payer: Self-pay | Admitting: Neurology

## 2021-11-25 VITALS — BP 134/78 | HR 91 | Ht 65.0 in | Wt 264.0 lb

## 2021-11-25 DIAGNOSIS — R251 Tremor, unspecified: Secondary | ICD-10-CM | POA: Diagnosis not present

## 2021-11-25 DIAGNOSIS — E782 Mixed hyperlipidemia: Secondary | ICD-10-CM

## 2021-11-25 MED ORDER — PRIMIDONE 50 MG PO TABS: 50.0000 mg | ORAL_TABLET | Freq: Every day | ORAL | 1 refills | Status: DC

## 2021-11-25 NOTE — Patient Instructions (Signed)
Start primidone 50 mg - 1/2 tablet at bedtime for 1 week and then increase to 1 tablet at bedtime thereafter.  The physicians and staff at [facility] are committed to providing excellent care. You may receive a survey requesting feedback about your experience at our office. We strive to receive "very good" responses to the survey questions. If you feel that your experience would prevent you from giving the office a "very good " response, please contact our office to try to remedy the situation. We may be reached at [phone]. Thank you for taking the time out of your busy day to complete the survey.

## 2021-11-25 NOTE — Telephone Encounter (Signed)
Please check with patient about tapering her Requip dose.  Dr. Carles Collet mention this in her note.  It may be that her restless leg symptoms get worse temporarily in the midst of the taper but I hope she will eventually be able to tolerate the taper and then do better.  I would try decreasing the ropinirole to 2 mg in the morning and 4 mg in the evening for 2 weeks and then decrease to 2 mg twice per day for 2 weeks and then only 2 mg at bedtime.    If needed, she could try increasing the gabapentin to compensate to 300 mg twice per day.   I think this is worth trying.  Please let me know how it goes. Thanks.

## 2021-11-26 ENCOUNTER — Ambulatory Visit: Payer: Medicare HMO | Attending: Cardiovascular Disease | Admitting: Physician Assistant

## 2021-11-26 ENCOUNTER — Encounter: Payer: Self-pay | Admitting: Physician Assistant

## 2021-11-26 ENCOUNTER — Other Ambulatory Visit: Payer: Self-pay | Admitting: Family Medicine

## 2021-11-26 VITALS — BP 126/62 | HR 90 | Ht 65.0 in | Wt 265.4 lb

## 2021-11-26 DIAGNOSIS — N1831 Chronic kidney disease, stage 3a: Secondary | ICD-10-CM | POA: Diagnosis not present

## 2021-11-26 DIAGNOSIS — E1165 Type 2 diabetes mellitus with hyperglycemia: Secondary | ICD-10-CM

## 2021-11-26 DIAGNOSIS — I5032 Chronic diastolic (congestive) heart failure: Secondary | ICD-10-CM | POA: Diagnosis not present

## 2021-11-26 DIAGNOSIS — E782 Mixed hyperlipidemia: Secondary | ICD-10-CM

## 2021-11-26 DIAGNOSIS — I700 Atherosclerosis of aorta: Secondary | ICD-10-CM | POA: Diagnosis not present

## 2021-11-26 LAB — COMPREHENSIVE METABOLIC PANEL
ALT: 18 IU/L (ref 0–32)
AST: 21 IU/L (ref 0–40)
Albumin/Globulin Ratio: 1.8 (ref 1.2–2.2)
Albumin: 4.6 g/dL (ref 3.9–4.9)
Alkaline Phosphatase: 98 IU/L (ref 44–121)
BUN/Creatinine Ratio: 13 (ref 12–28)
BUN: 18 mg/dL (ref 8–27)
Bilirubin Total: 0.5 mg/dL (ref 0.0–1.2)
CO2: 26 mmol/L (ref 20–29)
Calcium: 10.2 mg/dL (ref 8.7–10.3)
Chloride: 98 mmol/L (ref 96–106)
Creatinine, Ser: 1.43 mg/dL — ABNORMAL HIGH (ref 0.57–1.00)
Globulin, Total: 2.6 g/dL (ref 1.5–4.5)
Glucose: 147 mg/dL — ABNORMAL HIGH (ref 70–99)
Potassium: 4 mmol/L (ref 3.5–5.2)
Sodium: 141 mmol/L (ref 134–144)
Total Protein: 7.2 g/dL (ref 6.0–8.5)
eGFR: 40 mL/min/{1.73_m2} — ABNORMAL LOW (ref >59)

## 2021-11-26 LAB — LIPID PANEL
Chol/HDL Ratio: 4.3 ratio (ref 0.0–4.4)
Cholesterol, Total: 202 mg/dL — ABNORMAL HIGH (ref 100–199)
HDL: 47 mg/dL (ref >39)
LDL Chol Calc (NIH): 105 mg/dL — ABNORMAL HIGH (ref 0–99)
Triglycerides: 297 mg/dL — ABNORMAL HIGH (ref 0–149)
VLDL Cholesterol Cal: 50 mg/dL — ABNORMAL HIGH (ref 5–40)

## 2021-11-26 LAB — LDL CHOLESTEROL, DIRECT: LDL Direct: 110 mg/dL — ABNORMAL HIGH (ref 0–99)

## 2021-11-26 MED ORDER — FUROSEMIDE 20 MG PO TABS: 60.0000 mg | ORAL_TABLET | Freq: Two times a day (BID) | ORAL | 4 refills | Status: DC

## 2021-11-26 MED ORDER — FENOFIBRATE 145 MG PO TABS: 145.0000 mg | ORAL_TABLET | Freq: Every day | ORAL | 1 refills | Status: DC

## 2021-11-26 NOTE — Patient Instructions (Addendum)
Medication Instructions:  Your physician has recommended you make the following change in your medication:  INCREASE the Lasix to 20 mg taking 3 tablets twice a day INCREASE the Fenofibrate to 145 mg taking 1 daily   *If you need a refill on your cardiac medications before your next appointment, please call your pharmacy*   Lab Work: 1 WEEK:  12/04/21 come anytime after 7:15 for BMET  If you have labs (blood work) drawn today and your tests are completely normal, you will receive your results only by: [place] (if you have MyChart) OR A paper copy in the mail If you have any lab test that is abnormal or we need to change your treatment, we will call you to review the results.   Testing/Procedures: None ordered   Follow-Up: At [facility], you and your health needs are our priority.  As part of our continuing mission to provide you with exceptional heart care, we have created designated Provider Care Teams.  These Care Teams include your primary Cardiologist (physician) and Advanced Practice Providers (APPs -  Physician Assistants and Nurse Practitioners) who all work together to provide you with the care you need, when you need it.  We recommend signing up for the patient portal called "MyChart".  Sign up information is provided on this After Visit Summary.  MyChart is used to connect with patients for Virtual Visits (Telemedicine).  Patients are able to view lab/test results, encounter notes, upcoming appointments, etc.  Non-urgent messages can be sent to your provider as well.   To learn more about what you can do with MyChart, go to [email or web address].    Your next appointment:   3 month(s)  02/20/22 ARRIVE AT 9:00 .Marland Kitchen COME FASTING / WE WILL RECHECK YOUR CHOLESTEROL AT THIS APPOINTMENT  The format for your next appointment:   In Person  Provider:   Richardson Dopp, PA-C         Other Instructions   Important Information About Sugar

## 2021-11-26 NOTE — Assessment & Plan Note (Signed)
Labs in August demonstrated an LDL of 76 glycerides over 500.  Most recent lipid panel yesterday demonstrates triglycerides of 297 and direct LDL of 110.  She has not missed any doses of medications.  Continue Praluent 150 mg every 14 days.  Increase fenofibrate to 145 mg daily.  Obtain follow-up lipids and LFTs in 3 months.

## 2021-11-26 NOTE — Telephone Encounter (Signed)
Sent.  See previous phone note about potential gabapentin increase.  Thanks.

## 2021-11-26 NOTE — Assessment & Plan Note (Signed)
Monitor renal function closely with the increase in furosemide.

## 2021-11-26 NOTE — Telephone Encounter (Signed)
LMTCB

## 2021-11-26 NOTE — Telephone Encounter (Signed)
Refill request for Gabapentin 300 mg caps  LOV - 11/07/21 Next OV - not scheduled Last refill - 05/23/21 #90/1

## 2021-11-26 NOTE — Progress Notes (Signed)
Cardiology Office Note:    Date:  11/26/2021   ID:  Kathleen Hatfield, DOB [birth year], MRN [id number]  PCP:  [name], MD  [facility] Cardiologist:  [name], MD Cardiology APP:  [name]    Referring MD: [name], MD   Chief Complaint:  Follow-up for CHF    Patient Profile: Chronic diastolic CHF Echocardiogram 05/2019: EF 70, mild LVH, Gr 1 DD, severe RVH CMR 10/21: EF 78, normal RVSF, NO RVH seen, thick epicardial adipose tissue noted, nonspecific mid wall LGE at RV insertion site Myoview 05/2019: no ischemia Aortic atherosclerosis  Asthma/COPD Obstructive sleep apnea Morbid obesity Chronic kidney disease 2-3 Hypertension Diabetes mellitus Hyperlipidemia Intolerant of statins GERD Diverticulitis s/p partial colectomy  Prior CV Studies:   Cardiac MRI 01/01/20 IMPRESSION: 1. Normal LV size with mild LV hypertrophy, hyperdynamic systolic function with EF 78%.   2. Normal RV size and systolic function, EF 64%. There does not appear to be true RV hypertrophy present. There is very thick epicardial adipose tissue that may have been mistaken as RVH on the echo.   3. There was a small area of mid-wall LGE at the RV insertion site. This is nonspecific and may be suggestive of elevated filling pressures.   Echocardiogram 05/18/2019 EF 70, no RWMA, mild LVH, Gr 1 DD, normal RVSF, severe RVH, Asc Ao 39 mm   Myoview 06/07/2019 EF 83, no ischemia or infarction; Low Risk   Echocardiogram 04/03/2016 Mild LVH, EF 65-70, no RWMA, Gr 1 DD, LVOT gradient (2.6 m/s) likely related to hyperdynamic LVF   Myoview 06/23/2012 No ischemia, EF 82; Low Risk  History of Present Illness:   Kathleen Hatfield is a 66 y.o. female with the above problem list.  She has a hx of significant RVH noted on echocardiogram in March 2021.  She therefore underwent cardiac MRI which demonstrated EF 78%.  There was no true RVH present on cardiac MRI.  There was  very thick epicardial adipose tissue which may have been mistaken for RVH on echocardiogram.     She was last seen by Dr. Burt Knack in July 2023.  She returns for follow-up.  She is here with her husband.  Since last seen, she had follow-up labs that demonstrated worsening creatinine.  Her furosemide was cut back from 60 mg twice daily to 60 mg once daily.  Since that time, she has noted a 10 pound weight gain.  She is also had worsening lower extremity edema and worsening shortness of breath.  She also reports orthopnea.  She has not had chest pain or syncope.        Past Medical History:  Diagnosis Date   Anemia    Arthritis    Asthma    Chronic diastolic CHF 40/3474   Echocardiogram 05/2019: EF 70, no RWMA, mild LVH, Gr 1 DD, normal RVSF, severe LVH, borderline asc Aorta (39 mm)   CKD (chronic kidney disease)    Depression    Diabetes mellitus without complication (HCC)    Diverticulitis    Dyspnea    with exertion   GERD (gastroesophageal reflux disease)    History of blood transfusion    Hyperlipidemia    cannot tolerate statins   Hypertension    patient states she has never had high blood pressure.    Nuclear stress test    Myoview 05/2019: EF 83, no ischemia or infarction; Low Risk   Peripheral neuropathy    Pneumonia  PONV (postoperative nausea and vomiting)    RLS (restless legs syndrome)    Sleep apnea    uses CPAP   Ulcerative colitis ([place])    dr Collene Mares   Current Medications: Current Meds  Medication Sig   acetaminophen (TYLENOL) 500 MG tablet Take 500 mg by mouth every 6 (six) hours as needed for moderate pain.   albuterol (VENTOLIN HFA) 108 (90 Base) MCG/ACT inhaler INHALE TWO PUFFS BY MOUTH INTO LUNGS every SIX hours AS NEEDED   Alirocumab (PRALUENT) 150 MG/ML SOAJ Inject 1 pen. into the skin every 14 (fourteen) days.   aspirin EC 81 MG tablet Take 1 tablet (81 mg total) by mouth daily. Swallow whole.   BAYER CONTOUR NEXT TEST test strip 1 each by Other route daily.  As directed   budesonide (PULMICORT) 0.5 MG/2ML nebulizer solution Take 2 mLs (0.5 mg total) by nebulization 2 (two) times daily.   buPROPion (WELLBUTRIN XL) 300 MG 24 hr tablet TAKE ONE TABLET BY MOUTH EVERY MORNING   cetirizine (ZYRTEC) 10 MG tablet Take 10 mg by mouth at bedtime.   Cholecalciferol (VITAMIN D-3) 5000 UNITS TABS Take 5,000 Units by mouth every other day.    colchicine 0.6 MG tablet TAKE 1 TABLET (0.6 MG TOTAL) BY MOUTH DAILY AS NEEDED (FOR GOUT).   Continuous Blood Gluc Receiver ([place]) DEVI by Does not apply route.   Continuous Blood Gluc Sensor (DEXCOM G7 SENSOR) MISC by Does not apply route. Apply sensor every 10 days   dicyclomine (BENTYL) 20 MG tablet TAKE 1 TABLET UP TO FOUR TIMES A DAY FOR GI CRAMPING, PAIN, NAUSEA, AND VOMITING AS NEEDED   DULoxetine (CYMBALTA) 60 MG capsule TAKE 1 CAPSULE BY MOUTH ONCE DAILY.   estradiol (ESTRACE) 0.1 MG/GM vaginal cream Place 1 Applicatorful vaginally 3 (three) times a week.   fenofibrate (TRICOR) 145 MG tablet Take 1 tablet (145 mg total) by mouth daily.   fluticasone (FLONASE) 50 MCG/ACT nasal spray Place 2 sprays into both nostrils daily.   formoterol (PERFOROMIST) 20 MCG/2ML nebulizer solution Take 2 mLs (20 mcg total) by nebulization 2 (two) times daily.   furosemide (LASIX) 20 MG tablet Take 3 tablets (60 mg total) by mouth 2 (two) times daily.   gabapentin (NEURONTIN) 300 MG capsule Take 1 capsule (300 mg total) by mouth at bedtime.   GLOBAL EASE INJECT PEN NEEDLES 32G X 4 MM MISC USE AS DIRECTED WITH VICTOZA AND TRESIBA.   insulin degludec (TRESIBA FLEXTOUCH) 200 UNIT/ML FlexTouch Pen Inject 120 Units into the skin daily.   ipratropium-albuterol (DUONEB) 0.5-2.5 (3) MG/3ML SOLN Take 3 mLs by nebulization every 6 (six) hours as needed (wheezing).   Iron, Ferrous Sulfate, 325 (65 Fe) MG TABS Take 325 mg by mouth 3 (three) times a week.   meloxicam (MOBIC) 15 MG tablet Take 7.5 mg by mouth daily as needed for pain.    metolazone (ZAROXOLYN) 5 MG tablet TAKE 1 TABLET BY MOUTH EVERY DAY AS NEEDED   MICROLET LANCETS MISC 1 each by Other route. As directed   nitrofurantoin (MACRODANTIN) 50 MG capsule Take 50 mg by mouth daily.   nystatin (MYCOSTATIN/NYSTOP) powder Apply 1 application topically 3 (three) times daily.   ondansetron (ZOFRAN) 4 MG tablet TAKE ONE TABLET BY MOUTH every EIGHT hours AS NEEDED FOR NAUSEA AND VOMITING   OXYGEN Inhale 2 L into the lungs continuous.   pantoprazole (PROTONIX) 40 MG tablet TAKE ONE TABLET BY MOUTH EVERY MORNING   polyethylene glycol (MIRALAX /  GLYCOLAX) packet Take 17 g by mouth daily as needed for mild constipation.   potassium chloride (MICRO-K) 10 MEQ CR capsule Take 5 capsules (50 mEq total) by mouth daily.   primidone (MYSOLINE) 50 MG tablet Take 1 tablet (50 mg total) by mouth at bedtime.   revefenacin (YUPELRI) 175 MCG/3ML nebulizer solution Inhale one vial in nebulizer once daily. Do not mix with other nebulized medications.   rOPINIRole (REQUIP) 4 MG tablet TAKE 1 TABLET BY MOUTH TWICE DAILY   spironolactone (ALDACTONE) 25 MG tablet TAKE ONE TABLET BY MOUTH ONCE DAILY   traMADol (ULTRAM) 50 MG tablet Take 1 tablet (50 mg total) by mouth 3 (three) times daily as needed.   TRULICITY 4.5 OM/7.6HM SOPN INJECT 4.5 MG ONCE WEEKLY   UNABLE TO FIND CPAP- At bedtime   [DISCONTINUED] fenofibrate (TRICOR) 48 MG tablet Take 1 tablet (48 mg total) by mouth daily.   [DISCONTINUED] furosemide (LASIX) 20 MG tablet Take 3 tablets (60 mg total) by mouth daily.    Allergies:   Almond oil, Morphine and related, Atorvastatin, Ceclor [cefaclor], Sulfa antibiotics, Ciprofloxacin, Levaquin [levofloxacin], Losartan, and Statins   Social History   Tobacco Use   Smoking status: Never    Passive exposure: Past   Smokeless tobacco: Never  Vaping Use   Vaping Use: Never used  Substance Use Topics   Alcohol use: No   Drug use: No    Family Hx: The patient's family history includes  COPD in her father; Dementia in her mother; Diabetes in her maternal grandmother; Heart disease in her brother, father, and mother; Hypertension in her father and mother; Other in her brother and brother. There is no history of Breast cancer or Colon cancer.  Review of Systems  Respiratory:  Positive for wheezing.      EKGs/Labs/Other Test Reviewed:    EKG:  EKG is not ordered today.  The ekg ordered today demonstrates n/a  Recent Labs: 02/14/2021: B Natriuretic Peptide 40.3 02/27/2021: Pro B Natriuretic peptide (BNP) 23.0 11/07/2021: Hemoglobin 13.5; Platelets 325; TSH 3.14 11/25/2021: ALT 18; BUN 18; Creatinine, Ser 1.43; Potassium 4.0; Sodium 141   Recent Lipid Panel Recent Labs    11/25/21 1258  CHOL 202*  TRIG 297*  HDL 47  LDLCALC 105*  LDLDIRECT 110*     Risk Assessment/Calculations/Metrics:              Physical Exam:    VS:  BP 126/62   Pulse 90   Ht 5' 5"  (1.651 m)   Wt 265 lb 6.4 oz (120.4 kg)   SpO2 96%   BMI 44.16 kg/m     Wt Readings from Last 3 Encounters:  11/26/21 265 lb 6.4 oz (120.4 kg)  11/25/21 264 lb (119.7 kg)  11/07/21 265 lb (120.2 kg)    Constitutional:      Appearance: Healthy appearance. Not in distress.  Neck:     Vascular: JVD normal.  Pulmonary:     Effort: Pulmonary effort is normal.     Breath sounds: No wheezing. No rales.  Cardiovascular:     Normal rate. Regular rhythm. Normal S1. Normal S2.      Murmurs: There is a grade 2/6 systolic murmur at the URSB.  Edema:    Pretibial: bilateral 1+ edema of the pretibial area. Abdominal:     Palpations: Abdomen is soft.  Skin:    General: Skin is warm and dry.  Neurological:     Mental Status: Alert and oriented to person, place  and time.         ASSESSMENT & PLAN:   (HFpEF) heart failure with preserved ejection fraction (HCC) Overall, her heart failure symptoms have worsened since she reduced her furosemide to 60 mg once daily.  We discussed further management of HFpEF to  include spironolactone, SGLT2 inhibitors and ARNI.  She is already on spironolactone.  She does have some issues with frequent urinary tract infections and is currently under the care of a urologist.  She notes that she is taking chronic antibiotics at this time.  Therefore, I am not certain she would be able to tolerate SGLT2 inhibitors.  If her bladder issues do resolve, we can certainly consider that again in the future.  Her blood pressure may not be able to tolerate Entresto.  I have recommended that we go ahead and increase her furosemide back to 60 mg twice daily.  Obtain a BMET in a week.  Follow-up with me in 3 months.  Hyperlipidemia Labs in August demonstrated an LDL of 76 glycerides over 500.  Most recent lipid panel yesterday demonstrates triglycerides of 297 and direct LDL of 110.  She has not missed any doses of medications.  Continue Praluent 150 mg every 14 days.  Increase fenofibrate to 145 mg daily.  Obtain follow-up lipids and LFTs in 3 months.  Aortic atherosclerosis (HCC) Continue aspirin, PCSK9 inhibitor therapy.  CKD (chronic kidney disease) stage 3, GFR 30-59 ml/min (HCC) Monitor renal function closely with the increase in furosemide.  Type 2 diabetes mellitus with hyperglycemia, without long-term current use of insulin ([place]) Her endocrinologist left town.  She would like to reestablish with another.  I have asked her to follow-up with her PCP to discuss referral to Dr. Cruzita Lederer.            Dispo:  Return in about 3 months (around 02/25/2022) for Routine Follow Up, w/ Richardson Dopp, PA-C.   Medication Adjustments/Labs and Tests Ordered: Current medicines are reviewed at length with the patient today.  Concerns regarding medicines are outlined above.  Tests Ordered: Orders Placed This Encounter  Procedures   Basic metabolic panel   Medication Changes: Meds ordered this encounter  Medications   furosemide (LASIX) 20 MG tablet    Sig: Take 3 tablets (60 mg total) by  mouth 2 (two) times daily.    Dispense:  180 tablet    Refill:  4   fenofibrate (TRICOR) 145 MG tablet    Sig: Take 1 tablet (145 mg total) by mouth daily.    Dispense:  90 tablet    Refill:  1   Signed, Richardson Dopp, PA-C  11/26/2021 6:21 PM    [facility] [address], [place], [place]  [address] Phone: [phone]; Fax: [phone]

## 2021-11-26 NOTE — Assessment & Plan Note (Signed)
Overall, her heart failure symptoms have worsened since she reduced her furosemide to 60 mg once daily.  We discussed further management of HFpEF to include spironolactone, SGLT2 inhibitors and ARNI.  She is already on spironolactone.  She does have some issues with frequent urinary tract infections and is currently under the care of a urologist.  She notes that she is taking chronic antibiotics at this time.  Therefore, I am not certain she would be able to tolerate SGLT2 inhibitors.  If her bladder issues do resolve, we can certainly consider that again in the future.  Her blood pressure may not be able to tolerate Entresto.  I have recommended that we go ahead and increase her furosemide back to 60 mg twice daily.  Obtain a BMET in a week.  Follow-up with me in 3 months.

## 2021-11-26 NOTE — Assessment & Plan Note (Signed)
Her endocrinologist left town.  She would like to reestablish with another.  I have asked her to follow-up with her PCP to discuss referral to Dr. Cruzita Lederer.

## 2021-11-26 NOTE — Assessment & Plan Note (Signed)
Continue aspirin, PCSK9 inhibitor therapy.

## 2021-11-27 ENCOUNTER — Telehealth: Payer: Self-pay

## 2021-11-27 NOTE — Chronic Care Management (AMB) (Signed)
Chronic Care Management Pharmacy Assistant   Name: Kathleen Hatfield  MRN: [id number] DOB: [birth year]   Reason for Encounter: Medication Adherence and Delivery Coordination    Recent office visits:  11/07/21-Graham Duncan,MD(PCP)-AWV,Labs(Overall good news on your labs.  Your kidney function is stable, compared to last month,Your liver tests are normal.  Your A1c is significantly better, your iron level was normal, and your blood counts are fine.  Your thyroid test is normal) discussed screenings, vaccines, referral to new Endocrinology.  Recent consult visits:  11/26/21-[name],PA(cardio)-f/u heart failure, I am not certain she would be able to tolerate SGLT2 inhibitors. I have recommended that we go ahead and increase her furosemide back to 60 mg twice daily.  Obtain a BMET in a week. Increase fenofibrate to 145 mg daily.  Follow-up with me in 3 months. 11/25/21-Rebecca Tat,DO(neuro)-f/u tremors,Start primidone 50 mg - 1/2 tablet at bedtime for 1 week and then increase to 1 tablet at bedtime thereafter.f/u 6 months  Hospital visits:  None in previous 6 months 10/30/21-[facility]-pyuria,Her urinalysis does show some white cells.  We will culture the urine, and I am going to send in Keflex which she has tolerated in the past. Covid-swab.No admission   Medications: Outpatient Encounter Medications as of 11/27/2021  Medication Sig   acetaminophen (TYLENOL) 500 MG tablet Take 500 mg by mouth every 6 (six) hours as needed for moderate pain.   albuterol (VENTOLIN HFA) 108 (90 Base) MCG/ACT inhaler INHALE TWO PUFFS BY MOUTH INTO LUNGS every SIX hours AS NEEDED   Alirocumab (PRALUENT) 150 MG/ML SOAJ Inject 1 pen. into the skin every 14 (fourteen) days.   aspirin EC 81 MG tablet Take 1 tablet (81 mg total) by mouth daily. Swallow whole.   BAYER CONTOUR NEXT TEST test strip 1 each by Other route daily. As directed   budesonide (PULMICORT) 0.5 MG/2ML nebulizer solution Take 2 mLs (0.5  mg total) by nebulization 2 (two) times daily.   buPROPion (WELLBUTRIN XL) 300 MG 24 hr tablet TAKE ONE TABLET BY MOUTH EVERY MORNING   cetirizine (ZYRTEC) 10 MG tablet Take 10 mg by mouth at bedtime.   Cholecalciferol (VITAMIN D-3) 5000 UNITS TABS Take 5,000 Units by mouth every other day.    colchicine 0.6 MG tablet TAKE 1 TABLET (0.6 MG TOTAL) BY MOUTH DAILY AS NEEDED (FOR GOUT).   Continuous Blood Gluc Receiver ([place]) DEVI by Does not apply route.   Continuous Blood Gluc Sensor (DEXCOM G7 SENSOR) MISC by Does not apply route. Apply sensor every 10 days   dicyclomine (BENTYL) 20 MG tablet TAKE 1 TABLET UP TO FOUR TIMES A DAY FOR GI CRAMPING, PAIN, NAUSEA, AND VOMITING AS NEEDED   DULoxetine (CYMBALTA) 60 MG capsule TAKE 1 CAPSULE BY MOUTH ONCE DAILY.   estradiol (ESTRACE) 0.1 MG/GM vaginal cream Place 1 Applicatorful vaginally 3 (three) times a week.   fenofibrate (TRICOR) 145 MG tablet Take 1 tablet (145 mg total) by mouth daily.   fluticasone (FLONASE) 50 MCG/ACT nasal spray Place 2 sprays into both nostrils daily.   formoterol (PERFOROMIST) 20 MCG/2ML nebulizer solution Take 2 mLs (20 mcg total) by nebulization 2 (two) times daily.   furosemide (LASIX) 20 MG tablet Take 3 tablets (60 mg total) by mouth 2 (two) times daily.   gabapentin (NEURONTIN) 300 MG capsule Take 1 capsule (300 mg total) by mouth at bedtime. With second dose per day if needed.   GLOBAL EASE INJECT PEN NEEDLES 32G X 4  MM MISC USE AS DIRECTED WITH VICTOZA AND TRESIBA.   insulin degludec (TRESIBA FLEXTOUCH) 200 UNIT/ML FlexTouch Pen Inject 120 Units into the skin daily.   ipratropium-albuterol (DUONEB) 0.5-2.5 (3) MG/3ML SOLN Take 3 mLs by nebulization every 6 (six) hours as needed (wheezing).   Iron, Ferrous Sulfate, 325 (65 Fe) MG TABS Take 325 mg by mouth 3 (three) times a week.   meloxicam (MOBIC) 15 MG tablet Take 7.5 mg by mouth daily as needed for pain.   metolazone (ZAROXOLYN) 5 MG tablet TAKE 1  TABLET BY MOUTH EVERY DAY AS NEEDED   MICROLET LANCETS MISC 1 each by Other route. As directed   nitrofurantoin (MACRODANTIN) 50 MG capsule Take 50 mg by mouth daily.   nystatin (MYCOSTATIN/NYSTOP) powder Apply 1 application topically 3 (three) times daily.   ondansetron (ZOFRAN) 4 MG tablet TAKE ONE TABLET BY MOUTH every EIGHT hours AS NEEDED FOR NAUSEA AND VOMITING   OXYGEN Inhale 2 L into the lungs continuous.   pantoprazole (PROTONIX) 40 MG tablet TAKE ONE TABLET BY MOUTH EVERY MORNING   polyethylene glycol (MIRALAX / GLYCOLAX) packet Take 17 g by mouth daily as needed for mild constipation.   potassium chloride (MICRO-K) 10 MEQ CR capsule Take 5 capsules (50 mEq total) by mouth daily.   primidone (MYSOLINE) 50 MG tablet Take 1 tablet (50 mg total) by mouth at bedtime.   revefenacin (YUPELRI) 175 MCG/3ML nebulizer solution Inhale one vial in nebulizer once daily. Do not mix with other nebulized medications.   rOPINIRole (REQUIP) 4 MG tablet TAKE 1 TABLET BY MOUTH TWICE DAILY   spironolactone (ALDACTONE) 25 MG tablet TAKE ONE TABLET BY MOUTH ONCE DAILY   traMADol (ULTRAM) 50 MG tablet Take 1 tablet (50 mg total) by mouth 3 (three) times daily as needed.   TRULICITY 4.5 WI/0.9BD SOPN INJECT 4.5 MG ONCE WEEKLY   UNABLE TO FIND CPAP- At bedtime   No facility-administered encounter medications on file as of 11/27/2021.    BP Readings from Last 3 Encounters:  11/26/21 126/62  11/25/21 134/78  11/07/21 112/74    Lab Results  Component Value Date   HGBA1C 7.6 (H) 11/07/2021      Recent OV, Consult or Hospital visit: 11/26/21- Cardiology Recent medication changes indicated: INCREASE the Lasix to 20 mg taking 3 tablets twice a day INCREASE the Fenofibrate to 145 mg taking 1 daily  11/25/21 Neurology-Start primidone 50 mg - 1/2 tablet at bedtime for 1 week and then increase to 1 tablet at bedtime thereafter.  Last adherence delivery date:11/10/21      Patient is due for next adherence  delivery on: 12/09/21  Spoke with patient on 11/27/21 reviewed medications and coordinated delivery.  This delivery to include: Adherence Packaging  30 Days  Packs: Bupropion HCL XL 312m- take 1 tablet at evening meal Cholecalciferol (vit D3)-5000units- take 1 tablet breakfast every other day  Ropinirole 455m take 1 tablet breakfast 1 tablet bedtime  Pantoprazole 4057mtake 1 tablet bedtime Spironolactone 12m17make 1 tablet breakfast Furosemide 20mg106me 3 tablets breakfast Potassium chloride 10 meq ER- take 10 tablets at lunchtime Duloxetine 60mg-61me 1 tablet breakfast Ferrous Sulfate 312mg- 46m 1 tablet evening meal every other day  Gabapentin 300mg-ta28m tablet bedtime Cetirizine 10mg- ta64m tablet daily as needed Aspirin 81mg -tak10mtablet at breakfast Fenofibrate 145 mg-take 1 tablet breakfast   VIAL medications: Albuterol  108 mcg/act inhaler- 2 puffs into lungs every 6 hours as needed Gabapentin 300mg-take 24ma tablet  as needed at bedtime   Patient declined the following medications this month: Trulicity 1.4AD- [facility]   [place] -[facility]  Primidone 80m- gets [facility]  Any concerns about your medications? YesPatient would like to discuss Jardiance at some point.Patient inquired about medication for GAS pain,message sent to triage pool.  How often do you forget or accidentally miss a dose? Never  Do you use a pillbox? No  Is patient in packaging Yes  If yes  What is the date on your next pill pack? 11/25/21  Any concerns or issues with your packaging? Satisfied with process   Refills requested from providers include: albuterol inhaler,pantoprazole,spironolactone  Confirmed delivery date of 12/09/21, advised patient that pharmacy will contact them the morning of delivery.  Recent blood pressure readings are as follows:none available    Recent blood glucose readings are as follows: Fasting: while on call 307 after drinking diet coke, no food yet  as she just woke up 12 noon.  Averaging 150-160's am fasting     Annual wellness visit in last year? Yes Most Recent BP reading:126/62  11/26/21  If Diabetic: Most recent A1C reading:7.6 Last eye exam / retinopathy screening:2021 Last diabetic foot exam:UTD  [facility]ycle dispensing [facility] for [facility]. LCharlene Brooke CPP notified CCM  01/26/22  VAvel Sensor [employer] [phone]

## 2021-11-27 NOTE — Telephone Encounter (Signed)
Appears pantoprazole 40 mg was [facility]; pantoprazole helps with indigestion and reflux; is that what you want pt to take for gas also;sending note to Dr [name] and Janett Billow CMA.

## 2021-11-27 NOTE — Telephone Encounter (Signed)
-----   Message from [place], [place] sent at 11/27/2021  1:56 PM EDT ----- I was speaking with the patient to review medications for upcoming delivery from [place] and review recent changes. The patient asked me to get a message to Dr.Duncan that she is in need of something for GAS. She states she is unable to control her gas. Thanks for any help with the matter.  [facility] if RX please, or call and discuss OTC medication.    [name], [place]  [phone]

## 2021-11-28 MED ORDER — SIMETHICONE 80 MG PO CHEW: 80.0000 mg | CHEWABLE_TABLET | Freq: Four times a day (QID) | ORAL | 0 refills | Status: DC | PRN

## 2021-11-28 NOTE — Telephone Encounter (Signed)
Patient aware rx sent.

## 2021-11-28 NOTE — Telephone Encounter (Signed)
Spoke with patient about tapering requip dose. Patient is hesitate to do this but states she will give it a try. Advised patient to let us know how that goes.

## 2021-11-28 NOTE — Telephone Encounter (Signed)
I would try gas x prn.  Added to med list, sent.  Thanks.

## 2021-11-28 NOTE — Addendum Note (Signed)
Addended by: [name] on: 11/28/2021 07:15 AM   Modules accepted: Orders

## 2021-12-01 ENCOUNTER — Other Ambulatory Visit: Payer: Self-pay | Admitting: Family Medicine

## 2021-12-03 ENCOUNTER — Telehealth: Payer: Self-pay

## 2021-12-03 DIAGNOSIS — G4733 Obstructive sleep apnea (adult) (pediatric): Secondary | ICD-10-CM | POA: Diagnosis not present

## 2021-12-03 NOTE — Progress Notes (Signed)
Chronic Care Management Pharmacy Assistant   Name: KAYANNA MCKILLOP  MRN: [id number] DOB: February 02, 1956  Reason for Encounter: CCM (Appointment Reminder)  Medications: Outpatient Encounter Medications as of 12/03/2021  Medication Sig   acetaminophen (TYLENOL) 500 MG tablet Take 500 mg by mouth every 6 (six) hours as needed for moderate pain.   albuterol (VENTOLIN HFA) 108 (90 Base) MCG/ACT inhaler INHALE TWO PUFFS BY MOUTH INTO LUNGS every SIX hours AS NEEDED   Alirocumab (PRALUENT) 150 MG/ML SOAJ Inject 1 pen. into the skin every 14 (fourteen) days.   aspirin EC 81 MG tablet Take 1 tablet (81 mg total) by mouth daily. Swallow whole.   BAYER CONTOUR NEXT TEST test strip 1 each by Other route daily. As directed   budesonide (PULMICORT) 0.5 MG/2ML nebulizer solution Take 2 mLs (0.5 mg total) by nebulization 2 (two) times daily.   buPROPion (WELLBUTRIN XL) 300 MG 24 hr tablet TAKE ONE TABLET BY MOUTH EVERY MORNING   cetirizine (ZYRTEC) 10 MG tablet Take 10 mg by mouth at bedtime.   Cholecalciferol (VITAMIN D-3) 5000 UNITS TABS Take 5,000 Units by mouth every other day.    colchicine 0.6 MG tablet TAKE 1 TABLET (0.6 MG TOTAL) BY MOUTH DAILY AS NEEDED (FOR GOUT).   Continuous Blood Gluc Receiver ([place]) DEVI by Does not apply route.   Continuous Blood Gluc Sensor (DEXCOM G7 SENSOR) MISC by Does not apply route. Apply sensor every 10 days   dicyclomine (BENTYL) 20 MG tablet TAKE 1 TABLET UP TO FOUR TIMES A DAY FOR GI CRAMPING, PAIN, NAUSEA, AND VOMITING AS NEEDED   DULoxetine (CYMBALTA) 60 MG capsule TAKE 1 CAPSULE BY MOUTH ONCE DAILY.   estradiol (ESTRACE) 0.1 MG/GM vaginal cream Place 1 Applicatorful vaginally 3 (three) times a week.   fenofibrate (TRICOR) 145 MG tablet Take 1 tablet (145 mg total) by mouth daily.   fluticasone (FLONASE) 50 MCG/ACT nasal spray Place 2 sprays into both nostrils daily.   formoterol (PERFOROMIST) 20 MCG/2ML nebulizer solution Take 2 mLs (20 mcg total) by  nebulization 2 (two) times daily.   furosemide (LASIX) 20 MG tablet Take 3 tablets (60 mg total) by mouth 2 (two) times daily.   gabapentin (NEURONTIN) 300 MG capsule Take 1 capsule (300 mg total) by mouth at bedtime. With second dose per day if needed.   GLOBAL EASE INJECT PEN NEEDLES 32G X 4 MM MISC USE AS DIRECTED WITH VICTOZA AND TRESIBA.   insulin degludec (TRESIBA FLEXTOUCH) 200 UNIT/ML FlexTouch Pen Inject 120 Units into the skin daily.   ipratropium-albuterol (DUONEB) 0.5-2.5 (3) MG/3ML SOLN Take 3 mLs by nebulization every 6 (six) hours as needed (wheezing).   Iron, Ferrous Sulfate, 325 (65 Fe) MG TABS Take 325 mg by mouth 3 (three) times a week.   meloxicam (MOBIC) 15 MG tablet Take 7.5 mg by mouth daily as needed for pain.   metolazone (ZAROXOLYN) 5 MG tablet TAKE 1 TABLET BY MOUTH EVERY DAY AS NEEDED   MICROLET LANCETS MISC 1 each by Other route. As directed   nitrofurantoin (MACRODANTIN) 50 MG capsule Take 50 mg by mouth daily.   nystatin (MYCOSTATIN/NYSTOP) powder Apply 1 application topically 3 (three) times daily.   ondansetron (ZOFRAN) 4 MG tablet TAKE ONE TABLET BY MOUTH every EIGHT hours AS NEEDED FOR NAUSEA AND VOMITING   OXYGEN Inhale 2 L into the lungs continuous.   pantoprazole (PROTONIX) 40 MG tablet TAKE ONE TABLET BY MOUTH EVERY MORNING   polyethylene glycol (MIRALAX /  GLYCOLAX) packet Take 17 g by mouth daily as needed for mild constipation.   potassium chloride (MICRO-K) 10 MEQ CR capsule Take 5 capsules (50 mEq total) by mouth daily.   primidone (MYSOLINE) 50 MG tablet Take 1 tablet (50 mg total) by mouth at bedtime.   revefenacin (YUPELRI) 175 MCG/3ML nebulizer solution Inhale one vial in nebulizer once daily. Do not mix with other nebulized medications.   rOPINIRole (REQUIP) 4 MG tablet TAKE 1 TABLET BY MOUTH TWICE DAILY   simethicone (GAS-X) 80 MG chewable tablet Chew 1 tablet (80 mg total) by mouth every 6 (six) hours as needed for flatulence.   spironolactone  (ALDACTONE) 25 MG tablet TAKE ONE TABLET BY MOUTH ONCE DAILY   traMADol (ULTRAM) 50 MG tablet Take 1 tablet (50 mg total) by mouth 3 (three) times daily as needed.   TRULICITY 4.5 UX/3.2TF SOPN INJECT 4.5 MG ONCE WEEKLY   UNABLE TO FIND CPAP- At bedtime   No facility-administered encounter medications on file as of 12/03/2021.   MASON BURLEIGH was contacted to remind of upcoming telephone visit with [name] on 12/09/2021 at 1:30. Patient was reminded to have any blood glucose and blood pressure readings available for review at appointment.   Message was left reminding patient of appointment.  CCM referral has been placed prior to visit?  Yes   Star Rating Drugs: Medication:  Last Fill: Day Supply Trulicity 4.5 mg 57/32/2025 [place], CPP notified  Marijean Niemann, [facility] Assistant [phone]

## 2021-12-04 ENCOUNTER — Ambulatory Visit: Payer: Medicare HMO | Attending: Cardiovascular Disease

## 2021-12-04 DIAGNOSIS — I5032 Chronic diastolic (congestive) heart failure: Secondary | ICD-10-CM

## 2021-12-04 LAB — BASIC METABOLIC PANEL
BUN/Creatinine Ratio: 21 (ref 12–28)
BUN: 32 mg/dL — ABNORMAL HIGH (ref 8–27)
CO2: 28 mmol/L (ref 20–29)
Calcium: 10.2 mg/dL (ref 8.7–10.3)
Chloride: 93 mmol/L — ABNORMAL LOW (ref 96–106)
Creatinine, Ser: 1.51 mg/dL — ABNORMAL HIGH (ref 0.57–1.00)
Glucose: 189 mg/dL — ABNORMAL HIGH (ref 70–99)
Potassium: 3.7 mmol/L (ref 3.5–5.2)
Sodium: 138 mmol/L (ref 134–144)
eGFR: 38 mL/min/{1.73_m2} — ABNORMAL LOW (ref >59)

## 2021-12-09 ENCOUNTER — Ambulatory Visit (INDEPENDENT_AMBULATORY_CARE_PROVIDER_SITE_OTHER): Payer: Medicare HMO | Admitting: Pharmacist

## 2021-12-09 DIAGNOSIS — E1165 Type 2 diabetes mellitus with hyperglycemia: Secondary | ICD-10-CM

## 2021-12-09 DIAGNOSIS — J449 Chronic obstructive pulmonary disease, unspecified: Secondary | ICD-10-CM

## 2021-12-09 DIAGNOSIS — G720 Drug-induced myopathy: Secondary | ICD-10-CM

## 2021-12-09 DIAGNOSIS — Z794 Long term (current) use of insulin: Secondary | ICD-10-CM

## 2021-12-09 DIAGNOSIS — I10 Essential (primary) hypertension: Secondary | ICD-10-CM

## 2021-12-09 DIAGNOSIS — G2581 Restless legs syndrome: Secondary | ICD-10-CM

## 2021-12-09 DIAGNOSIS — I5032 Chronic diastolic (congestive) heart failure: Secondary | ICD-10-CM

## 2021-12-09 DIAGNOSIS — E782 Mixed hyperlipidemia: Secondary | ICD-10-CM

## 2021-12-09 NOTE — Progress Notes (Unsigned)
Chronic Care Management Pharmacy Note  12/09/2021 Name:  Kathleen Hatfield MRN:  [id number] DOB:  09-25-1955  Summary: CCM F/U visit -DM: A1c 7.6% (11/2021), improved from 9%; pt is wearing Dexcom and reports few alarms lately (meaning BG stays between 70-250 most of the time). Pt established with endocrine but her provider left practice in spring and pt has not re-established, new referral sent to endocrine last month -Obesity: pt wanted to discuss Ozempic - reviewed her Trulicity is very similar and equivalent doses of Ozempic are currently on backorder; she would be a good candidate for Mounjaro in future once insurance coverage picks up -HLD: LDL 110 (11/2021), improved from 208 with Praluent. ASCVD risk score improved from 25% to 15.9%; Trig improved from >500 to 297 with fenofibrate 48 mg, recently increased to 145 mg so further improvement is likely -RLS/tremor: pt recently tried primidone, which was helpful for RLS but caused drowsiness and brain fog so patient stopped it. She has not yet started tapering ropinirole as recommended by neurology and PCP - discussed reason for dose reduction and symptoms may get worse before they get better  Recommendations/Changes made from today's visit: -Advised pt to follow up with neurology regarding primidone failure -Keep f/u with cardiology in Dec for lipid f/u  Plan: -[place] will call patient monthly for medication coordination -Pharmacist follow up televisit scheduled for 3 months -PCP annual due Sept 2024    Subjective: Kathleen CRISCUOLO is an 66 y.o. year old female who is a primary patient of [name], [name], MD.  The CCM team was consulted for assistance with disease management and care coordination needs.    Engaged with patient by telephone for follow up visit in response to provider referral for pharmacy case management and/or care coordination services.   Consent to Services:  The patient was given information about Chronic Care  Management services, agreed to services, and gave verbal consent prior to initiation of services.  Please see initial visit note for detailed documentation.   Patient Care Team: [name], MD as PCP - General (Family Medicine) [name], MD as PCP - Cardiology (Cardiology) [name], MD as Consulting Physician (Pulmonary Disease) [name], MD as Consulting Physician (General Surgery) [place], [name], MD (Inactive) as Consulting Physician (Pulmonary Disease) [name], [facility] as Pharmacist (Pharmacist) [name] as Physician Assistant (Cardiology)  Recent office visits:  11/07/21 Dr [name] OV: annual - A1c 7.6%. Referred to endocrine and neurology.  03/11/21 - [name], MD, PCP - Pt presented for diabetes follow up. Increase insulin by 2 units/day for BP > 150. If you have more ankle swelling take 1 dose of metolazone.  Update me about your breathing and sugar next week.  02/27/21 - [name], MD, PCP - Pt presented for hospital follow up. Marland Kitchen  She was admitted with flu with hyperglycemia exacerbated by steroid use.  Improved in the meantime.  Discussed taking 1 dose of metolazone to see if that helped with her breathing and cough in the meantime.  She can use Tessalon for cough. 02/07/21 - Alma Friendly, NP - Pt presented for COPD exacerbation. Start doxycycline x 10 days and prednisone 40 mg x 5 days. Adjust insulin by 10 units for BG > 200.   11/28/20 - [name], MD, PCP - Pt presented for diabetes follow up and CHF. Stop meloxicam. Updated labs. A1c elevated, Referral to endocrinology. BNP normal, hold metolazone. Low iron, start supplementation.  Recent consult visits:  11/26/21 Dr Kathlen Mody (Cardiology): f/u HF - increase furosemide to 60 mg BID. Increase fenofibrate to 145 mg. Discussed SGLT2-I - she does have hx of recurrent UTI so deferred.  11/25/21 Dr Tat (Neurology): tremor - not noted on exam, no evidence essential tremor.  COPD meds may be driving tremor. Try primidone 50 mg. For RLS - ropinirole needs to be decreased - trial decrease to 2 mg AM, 4 mg PM x 2 weeks, then 2 mg BID x 2 weeks, then 2 mg only HS. Trial gabapentin 300 mg BID at same time to compensate.   10/30/21 Urgent Care - Pyruria, myalgia. Rx'd Keflex. Urine cx was negative, advised to d/c abx.  09/03/21 Cardiology TE - discuss lipid results on Praluent. Not at goal yet, pt declines additional med. Recheck lipids 6 weeks. 08/27/21-[name],MD (Pulmoanry): f/u sleep apnea-restart perforomist-Use yupelri in your nebulizer daily -previously seen by Dr. Wells Guiles Tat with [facility]; advised her to contact neurology if her tremor progressf/u 4 months 07/02/21 Fuller [place] PharmD (Lipid clinic): LDL 208. Start Praluent 150 mg q14d. 06/04/21-[name],PA(Cardiology)-F/U CHF-Recent creatinine stable. Arrange fasting Lipids(abnormal)  Refer to PharmD [facility]. Start ASA 81 mg once daily. 05/14/21 Dr [name] (Endocrine):  Establish care DM. Advised mealtime insulin, pt declined. Increased Tresiba to 120 units.   Hospital visits: 03/31/21 - ED visit, atypical chest pain 02/14/21 - 02/18/21 - Admission, COPD exacerbation  02/08/21 - ED, COPD exacerbation 12/12/20 - ED visit, acute gout  Objective:  Lab Results  Component Value Date   CREATININE 1.51 (H) 12/04/2021   BUN 32 (H) 12/04/2021   GFR 48.76 (L) 02/27/2021   GFRNONAA 48 (L) 03/31/2021   GFRAA 69 01/19/2020   NA 138 12/04/2021   K 3.7 12/04/2021   CALCIUM 10.2 12/04/2021   CO2 28 12/04/2021   GLUCOSE 189 (H) 12/04/2021    Lab Results  Component Value Date/Time   HGBA1C 7.6 (H) 11/07/2021 03:47 PM   HGBA1C 9.0 (A) 05/14/2021 03:10 PM   HGBA1C 10.9 (H) 02/14/2021 06:59 AM   GFR 48.76 (L) 02/27/2021 12:16 PM   GFR 38.07 (L) 11/28/2020 03:56 PM    Last diabetic Eye exam:  Lab Results  Component Value Date/Time   HMDIABEYEEXA No Retinopathy 12/22/2019 12:00 AM    Last diabetic  Foot exam: completed by PCP 11/28/2020 - normal    Lab Results  Component Value Date   CHOL 202 (H) 11/25/2021   HDL 47 11/25/2021   LDLCALC 105 (H) 11/25/2021   LDLDIRECT 110 (H) 11/25/2021   TRIG 297 (H) 11/25/2021   CHOLHDL 4.3 11/25/2021       Latest Ref Rng & Units 11/25/2021   12:58 PM 11/07/2021    3:47 PM 09/02/2021    8:50 AM  Hepatic Function  Total Protein 6.0 - 8.5 g/dL 7.2  6.8  7.3   Albumin 3.9 - 4.9 g/dL 4.6   4.6   AST 0 - 40 IU/L 21  18  23    ALT 0 - 32 IU/L 18  16  27    Alk Phosphatase 44 - 121 IU/L 98   113   Total Bilirubin 0.0 - 1.2 mg/dL 0.5  0.3  0.5   Bilirubin, Direct 0.00 - 0.40 mg/dL   0.15     Lab Results  Component Value Date/Time   TSH 3.14 11/07/2021 03:47 PM   TSH 2.850 06/09/2019 12:50 PM       Latest Ref Rng & Units 11/07/2021  3:47 PM 03/31/2021    6:06 PM 02/27/2021   12:16 PM  CBC  WBC 3.8 - 10.8 Thousand/uL 10.5  9.7  10.6   Hemoglobin 11.7 - 15.5 g/dL 13.5  13.6  13.6   Hematocrit 35.0 - 45.0 % 40.5  41.6  41.9   Platelets 140 - 400 Thousand/uL 325  328  360.0     Lab Results  Component Value Date/Time   VD25OH 45.52 06/18/2020 04:52 PM   VD25OH 47.60 06/22/2019 01:59 PM    Clinical ASCVD: No  The 10-year ASCVD risk score (Arnett DK, et al., 2019) is: 15.9%   Values used to calculate the score:     Age: 28 years     Sex: Female     Is Non-Hispanic African American: No     Diabetic: Yes     Tobacco smoker: No     Systolic Blood Pressure: 197 mmHg     Is BP treated: Yes     HDL Cholesterol: 47 mg/dL     Total Cholesterol: 202 mg/dL       07/10/2021   11:14 AM 08/12/2020    1:54 PM 12/28/2017    2:09 PM  Depression screen PHQ 2/9  Decreased Interest 0 3 1  Down, Depressed, Hopeless 1 1 1   PHQ - 2 Score 1 4 2   Altered sleeping 2 2 3   Tired, decreased energy 1 3 3   Change in appetite 0 2 0  Feeling bad or failure about yourself  0 3 3  Trouble concentrating 0 3 0  Moving slowly or fidgety/restless 0 3 3  Suicidal  thoughts 0 0 0  PHQ-9 Score 4 20 14   Difficult doing work/chores Not difficult at all Somewhat difficult Very difficult    Social History   Tobacco Use  Smoking Status Never   Passive exposure: Past  Smokeless Tobacco Never   BP Readings from Last 3 Encounters:  11/26/21 126/62  11/25/21 134/78  11/07/21 112/74   Pulse Readings from Last 3 Encounters:  11/26/21 90  11/25/21 91  11/07/21 93   Wt Readings from Last 3 Encounters:  11/26/21 265 lb 6.4 oz (120.4 kg)  11/25/21 264 lb (119.7 kg)  11/07/21 265 lb (120.2 kg)   BMI Readings from Last 3 Encounters:  11/26/21 44.16 kg/m  11/25/21 43.93 kg/m  11/07/21 44.10 kg/m   Iron/TIBC/Ferritin/ %Sat    Component Value Date/Time   IRON 54 11/07/2021 1547   IRON 66 09/19/2019 0000   TIBC 430 09/19/2019 0000   FERRITIN 64.4 11/28/2020 1556   FERRITIN 50 09/19/2019 0000   IRONPCTSAT 15 09/19/2019 0000    Assessment/Interventions: Review of patient past medical history, allergies, medications, health status, including review of consultants reports, laboratory and other test data, was performed as part of comprehensive evaluation and provision of chronic care management services.   SDOH:  (Social Determinants of Health) assessments and interventions performed: Yes SDOH Interventions    Flowsheet Row Clinical Support from 07/10/2021 in [place] at [place] [facility] from 09/02/2020 in [place] at [place] [facility] from 08/12/2020 in [place] at [place] [facility] from 07/29/2020 in [place] at [place] from 12/28/2017 in [place] from 03/29/2017 in [place]  SDOH Interventions        Food Insecurity Interventions Intervention Not Indicated -- Intervention Not Indicated -- -- --  Housing Interventions  Intervention Not Indicated -- -- -- -- --  Transportation Interventions Intervention Not Indicated -- Intervention Not Indicated -- -- --  Depression Interventions/Treatment  -- -- Medication -- Counseling, Medication, Currently on Treatment Currently on Treatment  [is on cymbalta, has seen a psychologist in past]  Financial Strain Interventions Intervention Not Indicated Intervention Not Indicated -- Intervention Not Indicated -- --  Physical Activity Interventions Intervention Not Indicated -- Other (Comments)  [Discussed  chair exercises.  Exercise booklet mailed to patient.] -- -- --  Stress Interventions Intervention Not Indicated -- -- -- -- --  Social Connections Interventions Intervention Not Indicated -- -- -- -- --      SDOH Screenings   Food Insecurity: No Food Insecurity (07/10/2021)  Housing: Low Risk  (07/10/2021)  Transportation Needs: No Transportation Needs (07/10/2021)  Alcohol Screen: Low Risk  (07/10/2021)  Depression (PHQ2-9): Low Risk  (07/10/2021)  Financial Resource Strain: Low Risk  (07/10/2021)  Physical Activity: Inactive (07/10/2021)  Social Connections: Moderately Isolated (07/10/2021)  Stress: No Stress Concern Present (07/10/2021)  Tobacco Use: Low Risk  (11/26/2021)    CCM Care Plan  Allergies  Allergen Reactions   Almond Oil Anaphylaxis, Shortness Of Breath and Swelling   Morphine And Related Shortness Of Breath and Other (See Comments)    Pt. States while in the hospital it affected her breathing, O2 dropped to the 70's   Atorvastatin Other (See Comments)    Leg weakness   Ceclor [Cefaclor] Nausea And Vomiting   Sulfa Antibiotics Nausea And Vomiting   Ciprofloxacin Other (See Comments)    Makes joints and muscles ache   Levaquin [Levofloxacin] Other (See Comments)    Body aches   Losartan Other (See Comments)    Weakness    Statins Other (See Comments)    Leg and body weakness    Medications Reviewed Today     Reviewed by [name], [facility]  (Pharmacist) on 12/09/21 at 1355  Med List Status: <None>   Medication Order Taking? Sig Documenting Provider Last Dose Status Informant  acetaminophen (TYLENOL) 500 MG tablet [id number] Yes Take 500 mg by mouth every 6 (six) hours as needed for moderate pain. [provider] Taking Active Self  albuterol (VENTOLIN HFA) 108 (90 Base) MCG/ACT inhaler [id number] Yes INHALE TWO PUFFS BY MOUTH INTO LUNGS every SIX hours AS NEEDED [name], MD Taking Active   Alirocumab (PRALUENT) 150 MG/ML SOAJ [id number] Yes Inject 1 pen. into the skin every 14 (fourteen) days. Supple, Harlon Flor, RPH-CPP Taking Active Self  aspirin EC 81 MG tablet [id number] Yes Take 1 tablet (81 mg total) by mouth daily. Swallow whole. Richardson Dopp T, PA-C Taking Active Self  BAYER CONTOUR NEXT TEST test strip [id number] Yes 1 each by Other route daily. As directed [provider] Taking Active Self           Med Note Tamala Julian, JEFFREY W   Mon May 03, 2017 11:02 PM)    budesonide (PULMICORT) 0.5 MG/2ML nebulizer solution [id number] Yes Take 2 mLs (0.5 mg total) by nebulization 2 (two) times daily. [name], MD Taking Active Self  buPROPion (WELLBUTRIN XL) 300 MG 24 hr tablet [id number] Yes TAKE ONE TABLET BY MOUTH EVERY MORNING [name], MD Taking Active   cetirizine (ZYRTEC) 10 MG tablet [id number] Yes Take 10 mg by mouth at bedtime. [provider] Taking Active Self  Cholecalciferol (VITAMIN D-3) 5000 UNITS TABS [id number] Yes Take 5,000 Units by mouth every other day.  [provider] Taking Active Self  colchicine 0.6 MG tablet [id number] Yes TAKE 1 TABLET (0.6 MG TOTAL) BY MOUTH DAILY AS NEEDED (FOR GOUT). [name], MD Taking Active   Continuous Blood Gluc Receiver ([place]) [place] [id number] Yes by Does not apply route. [provider] Taking Active Self  Continuous Blood Gluc Sensor ([place]) [place] [id number] Yes by Does not apply route. Apply sensor  every 10 days [provider] Taking Active Self  dicyclomine (BENTYL) 20 MG tablet [id number] Yes TAKE 1 TABLET UP TO FOUR TIMES A DAY FOR GI CRAMPING, PAIN, NAUSEA, AND VOMITING AS NEEDED [name], MD Taking Active Self  DULoxetine (CYMBALTA) 60 MG capsule [id number] Yes TAKE 1 CAPSULE BY MOUTH ONCE DAILY. [name], MD Taking Active Self  estradiol (ESTRACE) 0.1 MG/GM vaginal cream [id number] Yes Place 1 Applicatorful vaginally 3 (three) times a week. [provider] Taking Active Self  fenofibrate (TRICOR) 145 MG tablet [id number] Yes Take 1 tablet (145 mg total) by mouth daily. Richardson Dopp T, PA-C Taking Active   fluticasone (FLONASE) 50 MCG/ACT nasal spray [id number] Yes Place 2 sprays into both nostrils daily. Evlyn Courier, PA-C Taking Active Self  formoterol (PERFOROMIST) 20 MCG/2ML nebulizer solution [id number] Yes Take 2 mLs (20 mcg total) by nebulization 2 (two) times daily. [name], MD Taking Active Self  furosemide (LASIX) 20 MG tablet [id number] Yes Take 3 tablets (60 mg total) by mouth 2 (two) times daily. Richardson Dopp T, PA-C Taking Active   gabapentin (NEURONTIN) 300 MG capsule [id number] Yes Take 1 capsule (300 mg total) by mouth at bedtime. With second dose per day if needed. [name], MD Taking Active   GLOBAL EASE INJECT PEN NEEDLES 32G X 4 MM MISC [id number] Yes USE AS DIRECTED WITH VICTOZA AND TRESIBA. [name], MD Taking Active Self  insulin degludec (TRESIBA FLEXTOUCH) 200 UNIT/ML FlexTouch Pen [id number] Yes Inject 120 Units into the skin daily. [provider] Taking Active Self  ipratropium-albuterol (DUONEB) 0.5-2.5 (3) MG/3ML SOLN [id number] Yes Take 3 mLs by nebulization every 6 (six) hours as needed (wheezing). [name], MD Taking Active Self  Iron, Ferrous Sulfate, 325 (65 Fe) MG TABS [id number] Yes Take 325 mg by mouth 3 (three) times a week. [name], MD Taking Active   meloxicam [facility]) 15 MG tablet  [id number] Yes Take 7.5 mg by mouth daily as needed for pain. [provider] Taking Active Self  metolazone (ZAROXOLYN) 5 MG tablet [id number] Yes TAKE 1 TABLET BY MOUTH EVERY DAY AS NEEDED [name], MD Taking Active   [facility] LANCETS [place] [id number] Yes 1 each by Other route. As directed [provider] Taking Active Self           Med Note Mickie Bail Oct 21, 2015  3:31 PM)    nitrofurantoin (MACRODANTIN) 50 MG capsule [id number] Yes Take 50 mg by mouth daily. [provider] Taking Active Self           Med Note [name], [name]   Fri Nov 07, 2021  2:27 PM)    nystatin (MYCOSTATIN/NYSTOP) powder [id number] Yes Apply 1 application topically 3 (three) times daily. [name], MD Taking Active Self  ondansetron (ZOFRAN) 4 MG tablet [id number] Yes TAKE ONE TABLET BY MOUTH every EIGHT hours AS NEEDED FOR NAUSEA AND VOMITING [name], MD Taking Active Self  OXYGEN [id number] Yes Inhale 2 L into the lungs  continuous. [provider] Taking Active Self  pantoprazole (PROTONIX) 40 MG tablet [id number] Yes TAKE ONE TABLET BY MOUTH EVERY MORNING [name], MD Taking Active   polyethylene glycol [facility] / GLYCOLAX) packet [id number] Yes Take 17 g by mouth daily as needed for mild constipation. [provider] Taking Active Self  potassium chloride (MICRO-K) 10 MEQ CR capsule [id number] Yes Take 5 capsules (50 mEq total) by mouth daily. [name], MD Taking Active   revefenacin [facility]) 175 MCG/3ML nebulizer solution [id number] Yes Inhale one vial in nebulizer once daily. Do not mix with other nebulized medications. [name], MD Taking Active Self  rOPINIRole (REQUIP) 4 MG tablet [id number] Yes TAKE 1 TABLET BY MOUTH TWICE DAILY [name], MD Taking Active Self  simethicone (GAS-X) 80 MG chewable tablet [id number] Yes Chew 1 tablet (80 mg total) by mouth every 6 (six) hours as needed for flatulence. [name], MD  Taking Active   spironolactone (ALDACTONE) 25 MG tablet [id number] Yes TAKE ONE TABLET BY MOUTH ONCE DAILY [name], MD Taking Active   traMADol (ULTRAM) 50 MG tablet [id number] Yes Take 1 tablet (50 mg total) by mouth 3 (three) times daily as needed. [name], MD Taking Active   TRULICITY 4.5 MH/9.6QI [facility] [id number] Yes INJECT 4.5 MG ONCE WEEKLY [name], MD Taking Active Self  UNABLE TO FIND [id number] Yes CPAP- At bedtime [provider] Taking Active Self            Patient Active Problem List   Diagnosis Date Noted   Aortic atherosclerosis ([place]) 06/04/2021   Preoperative cardiovascular examination 06/04/2021   Hyperglycemia 02/14/2021   Rash 09/27/2020   COPD exacerbation ([place]) 09/21/2020   Acute exacerbation of COPD with asthma ([place]) 09/19/2020   Mixed diabetic hyperlipidemia associated with type 2 diabetes mellitus ([place]) 09/19/2020   Pain of foot 06/20/2020   Medicare annual wellness visit, initial 06/20/2020   Constipation 06/11/2020   Diverticulosis of colon 06/11/2020   Irritable bowel syndrome 48/18/5631   Periumbilical pain 49/70/2637   RLS (restless legs syndrome)    Asthma exacerbation 01/08/2020   Muscle weakness 01/08/2020   Grade I diastolic dysfunction 85/88/5027   Drug-induced myopathy 10/10/2019   Advance care planning 06/14/2019   RLQ abdominal pain 06/14/2019   COPD (chronic obstructive pulmonary disease) ([place]) 06/14/2019   Leukocytosis 06/14/2019   Hypercalcemia 06/14/2019   Gout 06/14/2019   Type 2 diabetes mellitus with hyperglycemia, without long-term current use of insulin ([place])    COPD with acute exacerbation ([place]) 05/03/2017   Diabetes mellitus without complication (HCC)    CKD (chronic kidney disease) stage 3, GFR 30-59 ml/min (HCC)    Hypersomnia with sleep apnea 09/16/2015   Fatigue 06/07/2015   Morbid obesity ([place]) 06/07/2015   (HFpEF) heart failure with preserved ejection fraction ([place])    Pneumonia of both  lower lobes due to infectious organism 02/09/2013   OSA on CPAP 08/23/2012   Essential hypertension    Depression    GERD without esophagitis    Hyperlipidemia    Ulcerative colitis ([place])    Patellar tendinitis 05/25/2011   Knee pain 05/25/2011   Myofascial pain 05/25/2011   Cervicalgia 05/25/2011    Immunization History  Administered Date(s) Administered   Influenza Split 12/01/2012, 01/18/2017   Influenza Whole 12/25/2011   Influenza,inj,Quad PF,6+ Mos 11/27/2013, 12/08/2014, 12/11/2015   Influenza-Unspecified 12/07/2017   Janssen (J&J) SARS-COV-2 Vaccination 06/15/2019   Pneumococcal Conjugate-13 02/17/2017   Pneumococcal Polysaccharide-23  11/27/2013   Tdap 04/01/2010   Zoster Recombinat (Shingrix) 10/06/2021    Conditions to be addressed/monitored:  Hypertension, Hyperlipidemia, Diabetes, Heart Failure, COPD, Asthma, Chronic Kidney Disease, and Depression  Care Plan : [place]  Updates made by [name], [place] since 12/10/2021 12:00 AM     Problem: Hypertension, Hyperlipidemia, Diabetes, Heart Failure, COPD, Asthma, Chronic Kidney Disease, and Depression   Priority: High     Long-Range Goal: Disease Management   Start Date: 07/29/2020  Expected End Date: 05/24/2022  This Visit's Progress: On track  Recent Progress: On track  Priority: High  Note:   Current Barriers:  Unable to achieve control of RLS/tremor  Pharmacist Clinical Goal(s):  Patient will contact provider office for questions/concerns as evidenced notation of same in electronic health record through collaboration with PharmD and provider.   Interventions: 1:1 collaboration with [name], MD regarding development and update of comprehensive plan of care as evidenced by provider attestation and co-signature Inter-disciplinary care team collaboration (see longitudinal plan of care) Comprehensive medication review performed; medication list updated in electronic medical  record  Hyperlipidemia: (LDL goal < 70) -Improved - LDL 110 (11/2021), improved from 208 (06/2021) with Praluent; TRIG 297 improved from > 500, fenofibrate was increased after most recent lipid panel -High ASCVD risk (25%) - reduced to 15.9% with improvement in cholesterol -Current treatment: Praluent 150 mg q 14 days - Appropriate, Effective, Safe, Accessible Fenofibrate 145 mg - Appropriate, Query Effective -Medications previously tried: Multiple statins (atorvastatin, others) - leg weakness; Vascepa (cost) -Educated on Cholesterol goals; benefits of Prauluent -Recommend to continue current medication  Diabetes (A1c goal <7%) -Improved - A1c 7.6% (11/2021) improved from 9.0%; pt endorses improvement in BG on higher dose of insulin and Trulicity.  -Followed with Dr [name] (established care 05/2021, she has not re-established with new provider yet).  -Dx 2017, insulin since 2020. Hx of chronic UTIs, would avoid SGLT2-inhibitor -Current home glucose readings: via Dexcom G7; sensor has been off for a day and she cannot provide any readings as she can only find 24-hr of info with her reader -Current medications: Trulicity 4.5 mg weekly (Thurs) - Appropriate, Query Effective Tresiba 120 units daily (midday) -Appropriate, Query Effective Dexcom G7 w/ reader - Appropriate, Effective, Safe, Accessible -Medications previously tried: Jardiance, Victoza, metformin -Current meal patterns: The patient reports she is on a salt free, sugar free, gluten free diet but had a hard time sticking to this. She tries to limit portions.  -Pt brought up Ozempic - discussed her Trulicity is very similar, and higher doses of Ozempic are on backorder currently so it would be best to stay on Trulicity for now; she would be a good candidate for Mounjaro in future -Recommend to continue current medication  Heart Failure (Goal: manage symptoms and prevent exacerbations) -Controlled - per pt report of symptoms. She has not  been checking her weight regularly; pt has previously had trouble swallowing large tablets but does well with Potassium 10 mEq capsules.  -Followed by Dr. Burt Knack -Last ejection fraction: 70% (Date: 03/21) -HF type: Diastolic; NYHA Class: II (slight limitation of activity) -Current treatment: Spironolactone 25 mg daily AM - Appropriate, Effective, Safe, Accessible Furosemide 20 mg - 3 tab BID- Appropriate, Effective, Safe, Accessible Potassium 10 mEq - 5 tab daily- Appropriate, Effective, Safe, Accessible Metolazone 2.5 mg PRN wt gain - Appropriate, Effective, Safe, Accessible -Medications previously tried: torsemide, bumetanide, losartan  -Recommended to continue current medication;  COPD with asthma, OSA (Goal: control  symptoms and prevent exacerbations) -Improving - pt has had multiple hospitalizations for COPD exacerbation in 2022; she is now taking her nebulizers as directed and reports doing better overall, using Duoneb and albuterol rescue less often; she wears Oxygen at night and PRN during the day;  -Followed by Dr Halford Chessman, [facility] Pulmonology. Pt does not follow up with pulmonology as frequently as recommended.  -Pulmonary function testing: 2021 FEV1 42% predicted -Gold Grade: Gold 3 (FEV1 30-49%) -Current COPD Classification:  E (high sx, >2 exacerbations/yr) -Exacerbations requiring treatment in last 6 months: yes, multiple -Compliant with CPAP nightly -Current regimen: Budesonide nebuilzer BID - Appropriate, Effective, Safe, Accessible Yupelri (revefenacin) nebulizer daily -Appropriate, Effective, Safe, Accessible Albuterol/ipratropium nebulizer - takes 2-3 times per day as needed - Appropriate, Effective, Safe, Accessible Albuterol inhaler - 2 puffs every 6 hours PRN - Appropriate, Effective, Safe, Accessible -Medications previously tried: per pt inhalers less effective than nebulizers, Perforomist was unaffordable; She was previously on Advair, Incruse and most recently Trelegy  but says she has stopped these because she does not like the powder inhalers. -Recommended to continue current medication; follow up with pulmonology more regularly  Depression/Anxiety/Pain(Goal: Control symptoms) -Controlled, stable per patient -Current treatment: Bupropion 300 mg daily - Appropriate, Effective, Safe, Accessible Duloxetine 60 mg daily - Appropriate, Effective, Safe, Accessible Gabapentin 100 mg - 3 caps HS - Appropriate, Effective, Safe, Accessible -Medications previously tried/failed: none -Concern that ropinirole may be causing her to fall asleep suddenly during the day. This has been discussed with patient before and per chart records, she was unable to tolerate dose reductions. We will revisit this issue periodically -Recommend to continue current medication  RLS (Goal: manage symptoms) -Not ideally controlled - pt reports she stopped primidone after a few weeks due to side effects (drowsiness, brain fog), although it did help with pain/tremor -Pt was recently advised to taper ropinirole to lower dose (current dose is Parkinson's dose, RLS dose is lower), but she has not started this taper; discussed reason for dose reduction and symptoms may get worse before they get better -Current treatment  Primidone 50 mg - stopped taking Ropinirole 4 mg BID (RLS) - Appropriate, Effective, Query Safe Gabapentin 100 mg - 3 caps HS - Appropriate, Effective, Safe, Accessible -Medications previously tried: n/a  -Recommended to continue current medication; advised to f/u with neurology  Patient Goals/Self-Care Activities Patient will:  - take medications as prescribed as evidenced by patient report and record review focus on medication adherence by PILL PACKS check glucose daily, document, and provide at future appointments check blood pressure daily, document, and provide at future appointments     Medication Assistance: None required.  Patient affirms current coverage meets  needs.  Medication Access: Within the past 30 days, how often has patient missed a dose of medication? 0 Is a pillbox or other method used to improve adherence? Yes  Factors that may affect medication adherence? no barriers identified Are meds synced by current pharmacy? Yes  Are meds delivered by current pharmacy? Yes  Does patient experience delays in picking up medications due to transportation concerns? No   [facility] Reviewed: Is patient disadvantaged to use [facility]?: No  Current Rx insurance plan: [employer] [place] Name and location of Current pharmacy:  [id number] [place] [place] [place]: [phone]: [phone] [facility] [place] [address] [id number]: [phone]  Fax: [phone]  [facility] services reviewed with patient today?: Yes  30-day Packs (delivery 12/09/21) Bupropion HCL XL 36m- take 1 tablet at evening meal Cholecalciferol (vit D3)-5000units- take 1 tablet breakfast every other day  Ropinirole 430m take 1 tablet breakfast 1 tablet bedtime  Pantoprazole 4062mtake 1 tablet bedtime Spironolactone 56m83make 1 tablet breakfast Furosemide 20mg29me 3 tablets breakfast and dinner Potassium chloride 10 meq ER- take 10 tablets at lunchtime Duloxetine 60mg-19me 1 tablet breakfast Ferrous Sulfate 356mg- 27m 1 tablet evening meal every other day  Gabapentin 300mg-ta59m tablet bedtime Cetirizine 10mg- ta13m tablet daily as needed Aspirin 81mg -tak81mtablet at breakfast Fenofibrate 145 mg-take 1 tablet breakfast    VIAL medications: Albuterol  108 mcg/act inhaler- 2 puffs into lungs every 6 hours as needed Gabapentin 300mg-take 57ma tablet as needed at bedtime    Patient declined the following medications this month: [facility].5mg- [facility]   [facility]   Primidone 50mg- [facility]  [facility] [place] Decision:  Patient agrees to Care Plan and Follow-up.  Follow Up Plan: Telephone follow up appointment with care management team member scheduled for: 3 months  Safal Halderman FoltCharlene BrookeACP Clinical Pharmacist [place] [facility] [phone]

## 2021-12-10 MED ORDER — POTASSIUM CHLORIDE ER 10 MEQ PO CPCR: 50.0000 meq | ORAL_CAPSULE | Freq: Every day | ORAL | 1 refills | Status: DC

## 2021-12-10 NOTE — Patient Instructions (Addendum)
Visit Information  Phone number for Pharmacist: [phone]   Goals Addressed   None     Care Plan : [place]  Updates made by [name], [facility] since 12/10/2021 12:00 AM     Problem: Hypertension, Hyperlipidemia, Diabetes, Heart Failure, COPD, Asthma, Chronic Kidney Disease, and Depression   Priority: High     Long-Range Goal: Disease Management   Start Date: 07/29/2020  Expected End Date: 05/24/2022  This Visit's Progress: On track  Recent Progress: On track  Priority: High  Note:   Current Barriers:  Unable to achieve control of RLS/tremor  Pharmacist Clinical Goal(s):  Patient will contact provider office for questions/concerns as evidenced notation of same in electronic health record through collaboration with PharmD and provider.   Interventions: 1:1 collaboration with [name], MD regarding development and update of comprehensive plan of care as evidenced by provider attestation and co-signature Inter-disciplinary care team collaboration (see longitudinal plan of care) Comprehensive medication review performed; medication list updated in electronic medical record  Hyperlipidemia: (LDL goal < 70) -Improved - LDL 110 (11/2021), improved from 208 (06/2021) with Praluent; TRIG 297 improved from > 500, fenofibrate was increased after most recent lipid panel -High ASCVD risk (25%) - reduced to 15.9% with improvement in cholesterol -Current treatment: Praluent 150 mg q 14 days - Appropriate, Effective, Safe, Accessible Fenofibrate 145 mg - Appropriate, Query Effective -Medications previously tried: Multiple statins (atorvastatin, others) - leg weakness; Vascepa (cost) -Educated on Cholesterol goals; benefits of Prauluent -Recommend to continue current medication  Diabetes (A1c goal <7%) -Improved - A1c 7.6% (11/2021) improved from 9.0%; pt endorses improvement in BG on higher dose of insulin and Trulicity.  -Followed with Dr [name] (established  care 05/2021, she has not re-established with new provider yet).  -Dx 2017, insulin since 2020. Hx of chronic UTIs, would avoid SGLT2-inhibitor -Current home glucose readings: via Dexcom G7; sensor has been off for a day and she cannot provide any readings as she can only find 24-hr of info with her reader -Current medications: Trulicity 4.5 mg weekly (Thurs) - Appropriate, Query Effective Tresiba 120 units daily (midday) -Appropriate, Query Effective Dexcom G7 w/ reader - Appropriate, Effective, Safe, Accessible -Medications previously tried: Jardiance, Victoza, metformin -Current meal patterns: The patient reports she is on a salt free, sugar free, gluten free diet but had a hard time sticking to this. She tries to limit portions.  -Pt brought up Ozempic - discussed her Trulicity is very similar, and higher doses of Ozempic are on backorder currently so it would be best to stay on Trulicity for now; she would be a good candidate for Mounjaro in future -Recommend to continue current medication  Heart Failure (Goal: manage symptoms and prevent exacerbations) -Controlled - per pt report of symptoms. She has not been checking her weight regularly; pt has previously had trouble swallowing large tablets but does well with Potassium 10 mEq capsules.  -Followed by Dr. Burt Knack -Last ejection fraction: 70% (Date: 03/21) -HF type: Diastolic; NYHA Class: II (slight limitation of activity) -Current treatment: Spironolactone 25 mg daily AM - Appropriate, Effective, Safe, Accessible Furosemide 20 mg - 3 tab BID- Appropriate, Effective, Safe, Accessible Potassium 10 mEq - 5 tab daily- Appropriate, Effective, Safe, Accessible Metolazone 2.5 mg PRN wt gain - Appropriate, Effective, Safe, Accessible -Medications previously tried: torsemide, bumetanide, losartan  -Recommended to continue current medication;  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) -Improving - pt has had  multiple hospitalizations for COPD  exacerbation in 2022; she is now taking her nebulizers as directed and reports doing better overall, using Duoneb and albuterol rescue less often; she wears Oxygen at night and PRN during the day;  -Followed by Dr Halford Chessman, [facility] Pulmonology. Pt does not follow up with pulmonology as frequently as recommended.  -Pulmonary function testing: 2021 FEV1 42% predicted -Gold Grade: Gold 3 (FEV1 30-49%) -Current COPD Classification:  E (high sx, >2 exacerbations/yr) -Exacerbations requiring treatment in last 6 months: yes, multiple -Compliant with CPAP nightly -Current regimen: Budesonide nebuilzer BID - Appropriate, Effective, Safe, Accessible Yupelri (revefenacin) nebulizer daily -Appropriate, Effective, Safe, Accessible Albuterol/ipratropium nebulizer - takes 2-3 times per day as needed - Appropriate, Effective, Safe, Accessible Albuterol inhaler - 2 puffs every 6 hours PRN - Appropriate, Effective, Safe, Accessible -Medications previously tried: per pt inhalers less effective than nebulizers, Perforomist was unaffordable; She was previously on Advair, Incruse and most recently Trelegy but says she has stopped these because she does not like the powder inhalers. -Recommended to continue current medication; follow up with pulmonology more regularly  Depression/Anxiety/Pain(Goal: Control symptoms) -Controlled, stable per patient -Current treatment: Bupropion 300 mg daily - Appropriate, Effective, Safe, Accessible Duloxetine 60 mg daily - Appropriate, Effective, Safe, Accessible Gabapentin 100 mg - 3 caps HS - Appropriate, Effective, Safe, Accessible -Medications previously tried/failed: none -Concern that ropinirole may be causing her to fall asleep suddenly during the day. This has been discussed with patient before and per chart records, she was unable to tolerate dose reductions. We will revisit this issue periodically -Recommend to continue current  medication  RLS (Goal: manage symptoms) -Not ideally controlled - pt reports she stopped primidone after a few weeks due to side effects (drowsiness, brain fog), although it did help with pain/tremor -Pt was recently advised to taper ropinirole to lower dose (current dose is Parkinson's dose, RLS dose is lower), but she has not started this taper; discussed reason for dose reduction and symptoms may get worse before they get better -Current treatment  Primidone 50 mg - stopped taking Ropinirole 4 mg BID (RLS) - Appropriate, Effective, Query Safe Gabapentin 100 mg - 3 caps HS - Appropriate, Effective, Safe, Accessible -Medications previously tried: n/a  -Recommended to continue current medication; advised to f/u with neurology  Patient Goals/Self-Care Activities Patient will:  - take medications as prescribed as evidenced by patient report and record review focus on medication adherence by PILL PACKS check glucose daily, document, and provide at future appointments check blood pressure daily, document, and provide at future appointments       Patient verbalizes understanding of instructions and care plan provided today and agrees to view in [place]. Active MyChart status and patient understanding of how to access instructions and care plan via MyChart confirmed with patient.    Telephone follow up appointment with pharmacy team member scheduled for: 3 months  [name], PharmD, [facility] Clinical Pharmacist [facility] at [facility] [phone]

## 2021-12-11 DIAGNOSIS — E1165 Type 2 diabetes mellitus with hyperglycemia: Secondary | ICD-10-CM | POA: Diagnosis not present

## 2021-12-19 DIAGNOSIS — J449 Chronic obstructive pulmonary disease, unspecified: Secondary | ICD-10-CM | POA: Diagnosis not present

## 2021-12-26 ENCOUNTER — Telehealth: Payer: Self-pay

## 2021-12-26 NOTE — Chronic Care Management (AMB) (Unsigned)
Chronic Care Management Pharmacy Assistant   Name: Kathleen Hatfield  MRN: [id number] DOB: 1955/05/04  Reason for Encounter: Medication Adherence and Delivery Coordination   Recent office visits:  None since last CCM contact  Recent consult visits:  None since last CCM contact  Hospital visits:  None in previous 6 months  Medications: Outpatient Encounter Medications as of 12/26/2021  Medication Sig   acetaminophen (TYLENOL) 500 MG tablet Take 500 mg by mouth every 6 (six) hours as needed for moderate pain.   albuterol (VENTOLIN HFA) 108 (90 Base) MCG/ACT inhaler INHALE TWO PUFFS BY MOUTH INTO LUNGS every SIX hours AS NEEDED   Alirocumab (PRALUENT) 150 MG/ML SOAJ Inject 1 pen. into the skin every 14 (fourteen) days.   aspirin EC 81 MG tablet Take 1 tablet (81 mg total) by mouth daily. Swallow whole.   BAYER CONTOUR NEXT TEST test strip 1 each by Other route daily. As directed   budesonide (PULMICORT) 0.5 MG/2ML nebulizer solution Take 2 mLs (0.5 mg total) by nebulization 2 (two) times daily.   buPROPion (WELLBUTRIN XL) 300 MG 24 hr tablet TAKE ONE TABLET BY MOUTH EVERY MORNING   cetirizine (ZYRTEC) 10 MG tablet Take 10 mg by mouth at bedtime.   Cholecalciferol (VITAMIN D-3) 5000 UNITS TABS Take 5,000 Units by mouth every other day.    colchicine 0.6 MG tablet TAKE 1 TABLET (0.6 MG TOTAL) BY MOUTH DAILY AS NEEDED (FOR GOUT).   Continuous Blood Gluc Receiver ([place]) DEVI by Does not apply route.   Continuous Blood Gluc Sensor (DEXCOM G7 SENSOR) MISC by Does not apply route. Apply sensor every 10 days   dicyclomine (BENTYL) 20 MG tablet TAKE 1 TABLET UP TO FOUR TIMES A DAY FOR GI CRAMPING, PAIN, NAUSEA, AND VOMITING AS NEEDED   DULoxetine (CYMBALTA) 60 MG capsule TAKE 1 CAPSULE BY MOUTH ONCE DAILY.   estradiol (ESTRACE) 0.1 MG/GM vaginal cream Place 1 Applicatorful vaginally 3 (three) times a week.   fenofibrate (TRICOR) 145 MG tablet Take 1 tablet (145 mg total) by mouth  daily.   fluticasone (FLONASE) 50 MCG/ACT nasal spray Place 2 sprays into both nostrils daily.   formoterol (PERFOROMIST) 20 MCG/2ML nebulizer solution Take 2 mLs (20 mcg total) by nebulization 2 (two) times daily.   furosemide (LASIX) 20 MG tablet Take 3 tablets (60 mg total) by mouth 2 (two) times daily.   gabapentin (NEURONTIN) 300 MG capsule Take 1 capsule (300 mg total) by mouth at bedtime. With second dose per day if needed.   GLOBAL EASE INJECT PEN NEEDLES 32G X 4 MM MISC USE AS DIRECTED WITH VICTOZA AND TRESIBA.   insulin degludec (TRESIBA FLEXTOUCH) 200 UNIT/ML FlexTouch Pen Inject 120 Units into the skin daily.   ipratropium-albuterol (DUONEB) 0.5-2.5 (3) MG/3ML SOLN Take 3 mLs by nebulization every 6 (six) hours as needed (wheezing).   Iron, Ferrous Sulfate, 325 (65 Fe) MG TABS Take 325 mg by mouth 3 (three) times a week.   meloxicam (MOBIC) 15 MG tablet Take 7.5 mg by mouth daily as needed for pain.   metolazone (ZAROXOLYN) 5 MG tablet TAKE 1 TABLET BY MOUTH EVERY DAY AS NEEDED   MICROLET LANCETS MISC 1 each by Other route. As directed   nitrofurantoin (MACRODANTIN) 50 MG capsule Take 50 mg by mouth daily.   nystatin (MYCOSTATIN/NYSTOP) powder Apply 1 application topically 3 (three) times daily.   ondansetron (ZOFRAN) 4 MG tablet TAKE ONE TABLET BY MOUTH every EIGHT hours AS NEEDED FOR  NAUSEA AND VOMITING   OXYGEN Inhale 2 L into the lungs continuous.   pantoprazole (PROTONIX) 40 MG tablet TAKE ONE TABLET BY MOUTH EVERY MORNING   polyethylene glycol (MIRALAX / GLYCOLAX) packet Take 17 g by mouth daily as needed for mild constipation.   potassium chloride (MICRO-K) 10 MEQ CR capsule Take 5 capsules (50 mEq total) by mouth daily.   revefenacin (YUPELRI) 175 MCG/3ML nebulizer solution Inhale one vial in nebulizer once daily. Do not mix with other nebulized medications.   rOPINIRole (REQUIP) 4 MG tablet TAKE 1 TABLET BY MOUTH TWICE DAILY   simethicone (GAS-X) 80 MG chewable tablet Chew 1  tablet (80 mg total) by mouth every 6 (six) hours as needed for flatulence.   spironolactone (ALDACTONE) 25 MG tablet TAKE ONE TABLET BY MOUTH ONCE DAILY   traMADol (ULTRAM) 50 MG tablet Take 1 tablet (50 mg total) by mouth 3 (three) times daily as needed.   TRULICITY 4.5 RF/1.6BW SOPN INJECT 4.5 MG ONCE WEEKLY   UNABLE TO FIND CPAP- At bedtime   No facility-administered encounter medications on file as of 12/26/2021.   BP Readings from Last 3 Encounters:  11/26/21 126/62  11/25/21 134/78  11/07/21 112/74    Lab Results  Component Value Date   HGBA1C 7.6 (H) 11/07/2021      No OVs, Consults, or hospital visits since last care coordination call / Pharmacist visit. No medication changes indicated  The patient reports she is back on the primidone, she can only tolerate 1/4 of tablet as needed, this helps her tremors    Last adherence delivery date:12/09/21      Patient is due for next adherence delivery on: 01/07/22  Spoke with patient on 12/30/21 reviewed medications and coordinated delivery.  This delivery to include: Adherence Packaging  30 Days  Packs: Bupropion HCL XL 33m- take 1 tablet at evening meal Cholecalciferol (vit D3)-5000units- take 1 tablet breakfast every other day  Ropinirole 427m take 1 tablet breakfast 1 tablet bedtime  Pantoprazole 4058mtake 1 tablet bedtime Spironolactone 54m28make 1 tablet breakfast Furosemide 20mg64me 3 tablets breakfast and dinner Duloxetine 60mg-37me 1 tablet breakfast Ferrous Sulfate 354mg- 14m 1 tablet evening meal every other day  Gabapentin 300mg-ta37m tablet bedtime Cetirizine 10mg- ta84m tablet daily as needed Aspirin 81mg -tak22mtablet at breakfast Fenofibrate 145 mg-take 1 tablet breakfast   VIAL medications: Albuterol  108 mcg/act inhaler- 2 puffs into lungs every 6 hours as needed Gabapentin 300mg-take 73ma tablet as needed at bedtime      Patient declined the following medications this month: Potassium  chloride 10 meq ER- take 10 tablets at lunchtime- supply o hand due to decrease in dose .   Any concerns about your medications? No  How often do you forget or accidentally miss a dose? Never  Do you use a pillbox? No  Is patient in packaging Yes  If yes  What is the date on your next pill pack?12/30/21  Any concerns or issues with your packaging? satisfied   No refill request needed.  Confirmed delivery date of 01/07/22, advised patient that pharmacy will contact them the morning of delivery.  Recent blood pressure readings are as follows: none available    Recent blood glucose readings are as follows: Fasting:  reports 143 after a finger stick- checks as well as uses the dexcom   Annual wellness visit in last year? Yes Most Recent BP reading:126/62  If Diabetic: Most recent A1C reading:7.6 Last eye  exam / retinopathy screening:2021 Last diabetic foot exam:UTD  Cycle dispensing form sent to [facility], CPP for review.  [name], CPP notified  [name], [place]  [phone]

## 2022-01-01 DIAGNOSIS — N390 Urinary tract infection, site not specified: Secondary | ICD-10-CM | POA: Diagnosis not present

## 2022-01-01 DIAGNOSIS — R8271 Bacteriuria: Secondary | ICD-10-CM | POA: Diagnosis not present

## 2022-01-06 DIAGNOSIS — I110 Hypertensive heart disease with heart failure: Secondary | ICD-10-CM

## 2022-01-06 DIAGNOSIS — J449 Chronic obstructive pulmonary disease, unspecified: Secondary | ICD-10-CM | POA: Diagnosis not present

## 2022-01-06 DIAGNOSIS — E785 Hyperlipidemia, unspecified: Secondary | ICD-10-CM

## 2022-01-06 DIAGNOSIS — I509 Heart failure, unspecified: Secondary | ICD-10-CM

## 2022-01-06 DIAGNOSIS — Z794 Long term (current) use of insulin: Secondary | ICD-10-CM | POA: Diagnosis not present

## 2022-01-06 DIAGNOSIS — E1159 Type 2 diabetes mellitus with other circulatory complications: Secondary | ICD-10-CM

## 2022-01-10 DIAGNOSIS — E1165 Type 2 diabetes mellitus with hyperglycemia: Secondary | ICD-10-CM | POA: Diagnosis not present

## 2022-01-17 ENCOUNTER — Other Ambulatory Visit: Payer: Self-pay | Admitting: Family Medicine

## 2022-01-19 DIAGNOSIS — J449 Chronic obstructive pulmonary disease, unspecified: Secondary | ICD-10-CM | POA: Diagnosis not present

## 2022-01-26 ENCOUNTER — Telehealth: Payer: Self-pay

## 2022-01-26 ENCOUNTER — Telehealth: Payer: Medicare HMO

## 2022-01-26 NOTE — Chronic Care Management (AMB) (Unsigned)
Chronic Care Management Pharmacy Assistant   Name: Kathleen Hatfield  MRN: [id number] DOB: April 26, 1955   Reason for Encounter: Medication Adherence and Delivery Coordination     Recent office visits:  None since last CCM contact   Recent consult visits:  None since last CCM contact   Hospital visits:  None in previous 6 months  Medications: Outpatient Encounter Medications as of 01/26/2022  Medication Sig   acetaminophen (TYLENOL) 500 MG tablet Take 500 mg by mouth every 6 (six) hours as needed for moderate pain.   albuterol (VENTOLIN HFA) 108 (90 Base) MCG/ACT inhaler INHALE TWO PUFFS BY MOUTH INTO LUNGS every SIX hours AS NEEDED   Alirocumab (PRALUENT) 150 MG/ML SOAJ Inject 1 pen. into the skin every 14 (fourteen) days.   aspirin EC 81 MG tablet Take 1 tablet (81 mg total) by mouth daily. Swallow whole.   BAYER CONTOUR NEXT TEST test strip 1 each by Other route daily. As directed   budesonide (PULMICORT) 0.5 MG/2ML nebulizer solution Take 2 mLs (0.5 mg total) by nebulization 2 (two) times daily.   buPROPion (WELLBUTRIN XL) 300 MG 24 hr tablet TAKE ONE TABLET BY MOUTH EVERY MORNING   cetirizine (ZYRTEC) 10 MG tablet Take 10 mg by mouth at bedtime.   Cholecalciferol (VITAMIN D-3) 5000 UNITS TABS Take 5,000 Units by mouth every other day.    colchicine 0.6 MG tablet TAKE 1 TABLET (0.6 MG TOTAL) BY MOUTH DAILY AS NEEDED (FOR GOUT)   Continuous Blood Gluc Receiver ([place]) DEVI by Does not apply route.   Continuous Blood Gluc Sensor (DEXCOM G7 SENSOR) MISC by Does not apply route. Apply sensor every 10 days   dicyclomine (BENTYL) 20 MG tablet TAKE 1 TABLET UP TO FOUR TIMES A DAY FOR GI CRAMPING, PAIN, NAUSEA, AND VOMITING AS NEEDED   DULoxetine (CYMBALTA) 60 MG capsule TAKE 1 CAPSULE BY MOUTH ONCE DAILY.   estradiol (ESTRACE) 0.1 MG/GM vaginal cream Place 1 Applicatorful vaginally 3 (three) times a week.   fenofibrate (TRICOR) 145 MG tablet Take 1 tablet (145 mg total) by  mouth daily.   fluticasone (FLONASE) 50 MCG/ACT nasal spray Place 2 sprays into both nostrils daily.   formoterol (PERFOROMIST) 20 MCG/2ML nebulizer solution Take 2 mLs (20 mcg total) by nebulization 2 (two) times daily.   furosemide (LASIX) 20 MG tablet Take 3 tablets (60 mg total) by mouth 2 (two) times daily.   gabapentin (NEURONTIN) 300 MG capsule Take 1 capsule (300 mg total) by mouth at bedtime. With second dose per day if needed.   GLOBAL EASE INJECT PEN NEEDLES 32G X 4 MM MISC USE AS DIRECTED WITH VICTOZA AND TRESIBA.   insulin degludec (TRESIBA FLEXTOUCH) 200 UNIT/ML FlexTouch Pen Inject 120 Units into the skin daily.   ipratropium-albuterol (DUONEB) 0.5-2.5 (3) MG/3ML SOLN Take 3 mLs by nebulization every 6 (six) hours as needed (wheezing).   Iron, Ferrous Sulfate, 325 (65 Fe) MG TABS Take 325 mg by mouth 3 (three) times a week.   meloxicam (MOBIC) 15 MG tablet Take 7.5 mg by mouth daily as needed for pain.   metolazone (ZAROXOLYN) 5 MG tablet TAKE 1 TABLET BY MOUTH EVERY DAY AS NEEDED   MICROLET LANCETS MISC 1 each by Other route. As directed   nitrofurantoin (MACRODANTIN) 50 MG capsule Take 50 mg by mouth daily.   nystatin (MYCOSTATIN/NYSTOP) powder Apply 1 application topically 3 (three) times daily.   ondansetron (ZOFRAN) 4 MG tablet TAKE ONE TABLET BY MOUTH every  EIGHT hours AS NEEDED FOR NAUSEA AND VOMITING   OXYGEN Inhale 2 L into the lungs continuous.   pantoprazole (PROTONIX) 40 MG tablet TAKE ONE TABLET BY MOUTH EVERY MORNING   polyethylene glycol (MIRALAX / GLYCOLAX) packet Take 17 g by mouth daily as needed for mild constipation.   potassium chloride (MICRO-K) 10 MEQ CR capsule Take 5 capsules (50 mEq total) by mouth daily.   primidone (MYSOLINE) 50 MG tablet Take 0.25 tablets by mouth daily.   revefenacin (YUPELRI) 175 MCG/3ML nebulizer solution Inhale one vial in nebulizer once daily. Do not mix with other nebulized medications.   rOPINIRole (REQUIP) 4 MG tablet TAKE 1  TABLET BY MOUTH TWICE DAILY   simethicone (GAS-X) 80 MG chewable tablet Chew 1 tablet (80 mg total) by mouth every 6 (six) hours as needed for flatulence.   spironolactone (ALDACTONE) 25 MG tablet TAKE ONE TABLET BY MOUTH ONCE DAILY   traMADol (ULTRAM) 50 MG tablet Take 1 tablet (50 mg total) by mouth 3 (three) times daily as needed.   TRULICITY 4.5 WY/6.3ZC SOPN INJECT 4.5 MG ONCE WEEKLY   UNABLE TO FIND CPAP- At bedtime   No facility-administered encounter medications on file as of 01/26/2022.    BP Readings from Last 3 Encounters:  11/26/21 126/62  11/25/21 134/78  11/07/21 112/74    Lab Results  Component Value Date   HGBA1C 7.6 (H) 11/07/2021      No OVs, Consults, or hospital visits since last care coordination call / Pharmacist visit. No medication changes indicated   Last adherence delivery date:01/07/22      Patient is due for next adherence delivery on: 02/06/22  Spoke with patient on 01/27/22 reviewed medications and coordinated delivery.  This delivery to include: Adherence Packaging  30 Days  Packs: Bupropion HCL XL 358m- take 1 tablet at evening meal Cholecalciferol (vit D3)-5000units- take 1 tablet breakfast every other day  Ropinirole 428m take 1 tablet breakfast 1 tablet bedtime  Pantoprazole 4019mtake 1 tablet bedtime Spironolactone 17m54make 1 tablet breakfast Furosemide 20mg52me 3 tablets breakfast and dinner Duloxetine 60mg-7me 1 tablet breakfast Ferrous Sulfate 317mg- 56m 1 tablet evening meal every other day  Gabapentin 300mg-ta31m tablet bedtime Cetirizine 10mg- ta62m tablet daily as needed Aspirin 81mg -tak86mtablet at breakfast Fenofibrate 145 mg-take 1 tablet breakfast   VIAL medications: Albuterol  108 mcg/act inhaler- 2 puffs into lungs every 6 hours as needed Gabapentin 300mg-take 19ma tablet as needed at bedtime    Patient declined the following medications this month: Potassium chloride 10 meq ER- take 10 tablets at  lunchtime- supply o hand due to decrease in dose    Any concerns about your medications? No  How often do you forget or accidentally miss a dose? Never  Do you use a pillbox? No  Is patient in packaging Yes  If yes  What is the date on your next pill pack? Not home to read pack date   Any concerns or issues with your packaging? satisfied   Refills requested from providers include: bupropion hcl  Confirmed delivery date of 02/06/22, advised patient that pharmacy will contact them the morning of delivery.  Recent blood pressure readings are as follows:none available   Recent blood glucose readings are as follows:none available    Annual wellness visit in last year? Yes Most Recent BP reading:126/62  11/26/21  If Diabetic: Most recent A1C reading:7.6 Last eye exam / retinopathy screening:2021 Last diabetic foot exam:UTD  Cycle  dispensing form sent to Margaretmary Dys, [place] for review.   [name], CPP notified  [name], [place]  [phone]

## 2022-02-03 ENCOUNTER — Other Ambulatory Visit: Payer: Self-pay | Admitting: Family Medicine

## 2022-02-09 DIAGNOSIS — E1165 Type 2 diabetes mellitus with hyperglycemia: Secondary | ICD-10-CM | POA: Diagnosis not present

## 2022-02-13 ENCOUNTER — Other Ambulatory Visit: Payer: Self-pay | Admitting: Family Medicine

## 2022-02-13 DIAGNOSIS — E1165 Type 2 diabetes mellitus with hyperglycemia: Secondary | ICD-10-CM

## 2022-02-17 ENCOUNTER — Ambulatory Visit: Payer: Medicare HMO | Admitting: Neurology

## 2022-02-18 DIAGNOSIS — J449 Chronic obstructive pulmonary disease, unspecified: Secondary | ICD-10-CM | POA: Diagnosis not present

## 2022-02-24 ENCOUNTER — Telehealth: Payer: Self-pay

## 2022-02-24 NOTE — Chronic Care Management (AMB) (Signed)
Chronic Care Management Pharmacy Assistant   Name: Kathleen Hatfield  MRN: [id number] DOB: Aug 23, 1955  Reason for Encounter: Medication Adherence and Delivery Coordination     Recent office visits:  None since last CCM contact  Recent consult visits:  None since last CCM contact  Hospital visits:  None in previous 6 months  Medications: Outpatient Encounter Medications as of 02/24/2022  Medication Sig   acetaminophen (TYLENOL) 500 MG tablet Take 500 mg by mouth every 6 (six) hours as needed for moderate pain.   albuterol (VENTOLIN HFA) 108 (90 Base) MCG/ACT inhaler INHALE TWO PUFFS BY MOUTH INTO LUNGS every SIX hours AS NEEDED   Alirocumab (PRALUENT) 150 MG/ML SOAJ Inject 1 pen. into the skin every 14 (fourteen) days.   aspirin EC 81 MG tablet Take 1 tablet (81 mg total) by mouth daily. Swallow whole.   BAYER CONTOUR NEXT TEST test strip 1 each by Other route daily. As directed   budesonide (PULMICORT) 0.5 MG/2ML nebulizer solution Take 2 mLs (0.5 mg total) by nebulization 2 (two) times daily.   buPROPion (WELLBUTRIN XL) 300 MG 24 hr tablet TAKE ONE TABLET BY MOUTH EVERY MORNING   cetirizine (ZYRTEC) 10 MG tablet Take 10 mg by mouth at bedtime.   Cholecalciferol (VITAMIN D-3) 5000 UNITS TABS Take 5,000 Units by mouth every other day.    colchicine 0.6 MG tablet TAKE 1 TABLET (0.6 MG TOTAL) BY MOUTH DAILY AS NEEDED (FOR GOUT)   Continuous Blood Gluc Receiver ([place]) DEVI by Does not apply route.   Continuous Blood Gluc Sensor (DEXCOM G7 SENSOR) MISC by Does not apply route. Apply sensor every 10 days   dicyclomine (BENTYL) 20 MG tablet TAKE 1 TABLET UP TO FOUR TIMES A DAY FOR GI CRAMPING, PAIN, NAUSEA, AND VOMITING AS NEEDED   Dulaglutide (TRULICITY) 4.5 MV/6.7MC SOPN INJECT 4.5 MG ONCE WEEKLY   DULoxetine (CYMBALTA) 60 MG capsule TAKE 1 CAPSULE BY MOUTH ONCE DAILY.   estradiol (ESTRACE) 0.1 MG/GM vaginal cream Place 1 Applicatorful vaginally 3 (three) times a week.    fenofibrate (TRICOR) 145 MG tablet Take 1 tablet (145 mg total) by mouth daily.   fluticasone (FLONASE) 50 MCG/ACT nasal spray Place 2 sprays into both nostrils daily.   formoterol (PERFOROMIST) 20 MCG/2ML nebulizer solution Take 2 mLs (20 mcg total) by nebulization 2 (two) times daily.   furosemide (LASIX) 20 MG tablet Take 3 tablets (60 mg total) by mouth 2 (two) times daily.   gabapentin (NEURONTIN) 300 MG capsule Take 1 capsule (300 mg total) by mouth at bedtime. With second dose per day if needed.   GLOBAL EASE INJECT PEN NEEDLES 32G X 4 MM MISC USE AS DIRECTED WITH VICTOZA AND TRESIBA.   insulin degludec (TRESIBA FLEXTOUCH) 200 UNIT/ML FlexTouch Pen Inject 120 Units into the skin daily.   ipratropium-albuterol (DUONEB) 0.5-2.5 (3) MG/3ML SOLN Take 3 mLs by nebulization every 6 (six) hours as needed (wheezing).   Iron, Ferrous Sulfate, 325 (65 Fe) MG TABS Take 325 mg by mouth 3 (three) times a week.   meloxicam (MOBIC) 15 MG tablet Take 7.5 mg by mouth daily as needed for pain.   metolazone (ZAROXOLYN) 5 MG tablet TAKE 1 TABLET BY MOUTH EVERY DAY AS NEEDED   MICROLET LANCETS MISC 1 each by Other route. As directed   nitrofurantoin (MACRODANTIN) 50 MG capsule Take 50 mg by mouth daily.   nystatin (MYCOSTATIN/NYSTOP) powder Apply 1 application topically 3 (three) times daily.   ondansetron (ZOFRAN)  4 MG tablet TAKE ONE TABLET BY MOUTH every EIGHT hours AS NEEDED FOR NAUSEA AND VOMITING   OXYGEN Inhale 2 L into the lungs continuous.   pantoprazole (PROTONIX) 40 MG tablet TAKE ONE TABLET BY MOUTH EVERY MORNING   polyethylene glycol (MIRALAX / GLYCOLAX) packet Take 17 g by mouth daily as needed for mild constipation.   potassium chloride (MICRO-K) 10 MEQ CR capsule Take 5 capsules (50 mEq total) by mouth daily.   primidone (MYSOLINE) 50 MG tablet Take 0.25 tablets by mouth daily.   revefenacin (YUPELRI) 175 MCG/3ML nebulizer solution Inhale one vial in nebulizer once daily. Do not mix with other  nebulized medications.   rOPINIRole (REQUIP) 4 MG tablet TAKE 1 TABLET BY MOUTH TWICE DAILY   simethicone (GAS-X) 80 MG chewable tablet Chew 1 tablet (80 mg total) by mouth every 6 (six) hours as needed for flatulence.   spironolactone (ALDACTONE) 25 MG tablet TAKE ONE TABLET BY MOUTH ONCE DAILY   traMADol (ULTRAM) 50 MG tablet Take 1 tablet (50 mg total) by mouth 3 (three) times daily as needed.   UNABLE TO FIND CPAP- At bedtime   No facility-administered encounter medications on file as of 02/24/2022.    BP Readings from Last 3 Encounters:  11/26/21 126/62  11/25/21 134/78  11/07/21 112/74    Pulse Readings from Last 3 Encounters:  11/26/21 90  11/25/21 91  11/07/21 93    Lab Results  Component Value Date/Time   HGBA1C 7.6 (H) 11/07/2021 03:47 PM   HGBA1C 9.0 (A) 05/14/2021 03:10 PM   HGBA1C 10.9 (H) 02/14/2021 06:59 AM   Lab Results  Component Value Date   CREATININE 1.51 (H) 12/04/2021   BUN 32 (H) 12/04/2021   GFR 48.76 (L) 02/27/2021   GFRNONAA 48 (L) 03/31/2021   GFRAA 69 01/19/2020   NA 138 12/04/2021   K 3.7 12/04/2021   CALCIUM 10.2 12/04/2021   CO2 28 12/04/2021     Last adherence delivery date:02/06/22      Patient is due for next adherence delivery on: 03/09/2022  Spoke with patient on 02/24/22 reviewed medications and coordinated delivery.  This delivery to include: Adherence Packaging  30 Days  Bupropion HCL XL 340m- take 1 tablet at evening meal Cholecalciferol (vit D3)-5000units- take 1 tablet breakfast every other day  Ropinirole 419m take 1 tablet breakfast 1 tablet bedtime  Pantoprazole 4043mtake 1 tablet bedtime Spironolactone 84m28make 1 tablet breakfast Furosemide 20mg50me 3 tablets breakfast and dinner Duloxetine 60mg-69me 1 tablet breakfast Ferrous Sulfate 384mg- 83m 1 tablet evening meal every other day  Gabapentin 300mg-ta44m tablet bedtime Cetirizine 10mg- ta4m tablet daily as needed Aspirin 81mg -tak68mtablet at  breakfast Fenofibrate 145 mg-take 1 tablet breakfast    VIAL medications: Albuterol  108 mcg/act inhaler- 2 puffs into lungs every 6 hours as needed Gabapentin 300mg-take 41ma tablet as needed at bedtime    Patient declined the following medications this month: Potassium chloride 10 meq ER- take 10 tablets at lunchtime- supply o hand due to decrease in dose     Any concerns about your medications? No  How often do you forget or accidentally miss a dose? Never  Do you use a pillbox? No  Is patient in packaging Yes  What is the date on your next pill pack?02/26/22  Any concerns or issues with your packaging? satisfied   Refills requested from providers include: Gabapentin  PCP   Confirmed delivery date of 03/09/2022, advised patient  that pharmacy will contact them the morning of delivery.  Recent blood pressure readings are as follows:none available   Recent blood glucose readings are as follows:none available   Annual wellness visit in last year? Yes Most Recent BP reading:126/62  If Diabetic: Most recent A1C reading:7.6 Last eye exam / retinopathy screening:2021 Last diabetic foot exam:UTD  Cycle dispensing form sent to [facility], CPP for review. Next CCM appointment: 03/13/2022  [name], CPP notified  [name], [place]  [phone]

## 2022-02-24 NOTE — Progress Notes (Signed)
Cardiology Office Note:    Date:  02/25/2022   ID:  Kathleen Hatfield, DOB [birth year], MRN [id number]  PCP:  Joaquim Nam, MD  [facility] Providers Cardiologist:  Tonny Bollman, MD Cardiology APP:  Kennon Rounds     Referring MD: Joaquim Nam, MD   Chief Complaint:  F/u for CHF    Patient Profile: (HFpEF) heart failure with preserved ejection fraction  TTE 04/03/16: LVOT 2.6 m/s TTE 05/18/19: EF 70, no RWMA, mild LVH, Gr 1 DD, normal RVSF, severe RVH, Asc Ao 39 mm  CMR 01/01/20: EF 78, normal RVSF, NO RVH seen, thick epicardial adipose tissue noted, nonspecific mid wall LGE at RV insertion site Myoview 05/2019: no ischemia Aortic atherosclerosis  Asthma/COPD Obstructive sleep apnea Morbid obesity Chronic kidney disease 2-3 Furosemide adj in past due to ? SCr Hypertension Diabetes mellitus Hyperlipidemia Intolerant of statins GERD Diverticulitis s/p partial colectomy     History of Present Illness:   Kathleen Hatfield is a 66 y.o. female with the above problem list.  She She has a hx of significant RVH noted on echocardiogram in March 2021.  She therefore underwent cardiac MRI which demonstrated EF 78%.  There was no true RVH present on cardiac MRI.  There was very thick epicardial adipose tissue which may have been mistaken for RVH on echocardiogram.   She was last seen 11/26/21.  At that visit, we discussed +/- SGLT2 inhibitors.  She did note that she has issues with chronic urinary tract infections.  Therefore, I was not certain she would be able to tolerate this.  I adjusted her furosemide back to 60 mg twice a day.  She returns for follow-up.  She is here today with her husband.  She notes that she just does not feel well.  She does not really have any specific symptoms other than diffuse myalgias.  She does note that she feels better when she does not take most of her medications.  She has not had chest pain.  She has chronic shortness of breath without  change.  She has not had syncope.  She continues to sleep on an incline.  She has had some leg edema which is unchanged.  Her weights are stable.  She takes antibiotics on her somewhat regular basis to manage urinary tract infections.  She has had a recent flare of gout.       EKG: NSR, HR 80, left anterior fascicular block, right bundle branch block, QTc 532    Reviewed and updated this encounter:  Tobacco  Allergies  Meds  Problems  Med Hx  Surg Hx  Fam Hx     Review of Systems  Constitutional: Positive for malaise/fatigue. Negative for chills and fever.  Respiratory:  Negative for cough and wheezing.   Gastrointestinal:  Negative for hematochezia.  Genitourinary:  Negative for hematuria.    Labs/Other Test Reviewed:   Recent Labs: 02/27/2021: Pro B Natriuretic peptide (BNP) 23.0 11/07/2021: Hemoglobin 13.5; Platelets 325; TSH 3.14 11/25/2021: ALT 18 12/04/2021: BUN 32; Creatinine, Ser 1.51; Potassium 3.7; Sodium 138   Recent Lipid Panel Recent Labs    11/25/21 1258  CHOL 202*  TRIG 297*  HDL 47  LDLCALC 105*  LDLDIRECT 110*    Risk Assessment/Calculations/Metrics:             Physical Exam:   VS:  BP 123/62   Pulse 86   Ht 5\' 5"  (1.651 m)   Wt 263 lb 6.4 oz (  119.5 kg)   SpO2 94%   BMI 43.83 kg/m    Wt Readings from Last 3 Encounters:  02/25/22 263 lb 6.4 oz (119.5 kg)  11/26/21 265 lb 6.4 oz (120.4 kg)  11/25/21 264 lb (119.7 kg)    Constitutional:      Appearance: Not in distress.  Neck:     Vascular: No JVR.  Pulmonary:     Breath sounds: No rales.  Cardiovascular:     Normal rate. Regular rhythm. Normal S1. Normal S2.   Edema:    Pretibial: bilateral trace edema of the pretibial area. Abdominal:     Palpations: Abdomen is soft.  Skin:    General: Skin is warm and dry.  Neurological:     General: No focal deficit present.     Mental Status: Alert and oriented to person, place and time.         ASSESSMENT & PLAN:   Fatigue Etiology not  entirely clear.  She does have difficulty with her knees and needs to have knee replacements.  She arrives in a wheelchair today.  It is hard for her to get around.  Question if a lot of her symptoms are related to just deconditioning.  She is in normal sinus rhythm today.  She does take a lot of medications which may be contributing to her fatigue.  She does note that she feels better when she does not take her medications.  She is not certain if her CPAP is working correctly.  She is often somnolent.  I do not suspect that she is experiencing a cardiac cause for her symptoms.  As she does take high-dose diuretics, I will check labs today to make sure her renal function is stable as well as her electrolytes. Obtain CMET, CBC, magnesium Refer to Pharm.D. for med rec Follow-up with PCP to identify other causes F/u with pulmonologist to see if CPAP settings need to be adjusted.  Hyperlipidemia Her drug plan is changing at the beginning of the year.  She would no longer have Praluent covered.  Obtain follow-up direct LDL today.  I have contacted our Pharm.D. to help arrange transitioning her to [place].  Myalgia As noted, her symptoms are likely related to multiple medication interactions.  I will have her hold her fenofibrate for 2 weeks to see if this helps.  If it does not, she knows to resume.  If it does help, she will contact us.  At that point, I would consider Vascepa.  (HFpEF) heart failure with preserved ejection fraction (HCC) NYHA III.  Her dyspnea is mainly related to function limitations related to COPD, deconditioning, obesity.  Volume status appears stable on exam today.  Her weights have been stable.  Obtain follow-up CMET, magnesium.  Continue furosemide 60 mg twice daily, metolazone 5 mg daily as needed, K+ 10 mEq 5 tabs (50 mEq) daily, spironolactone 25 mg daily.  CKD (chronic kidney disease) stage 3, GFR 30-59 ml/min (HCC) Follow-up CMET today.  Prolonged Q-T interval on ECG Her  QT is prolonged on EKG today.  As noted, I will check CMET, magnesium. I will have her DC Zofran for now. I called her after seh left the office and she confirmed that she does not take it. She does not have congenital Long QT. So albuterol will be ok. If her electrolytes are abnormal, will have to hold her Tramadol, Pantoprazole until corrected.            Dispo:  Return in about 6  months (around 08/27/2022) for Routine Follow Up, w/ Dr. Excell Seltzer.  Medication Adjustments/Labs and Tests Ordered: Current medicines are reviewed at length with the patient today.  Concerns regarding medicines are outlined above.  Tests Ordered: Orders Placed This Encounter  Procedures   Comp Met (CMET)   Direct LDL   CBC   Magnesium   CK   EKG 12-Lead   Medication Changes: No orders of the defined types were placed in this encounter.  Signed, Tereso Newcomer, PA-C  02/25/2022 10:42 AM    [facility] [address] [place], [place], [place]  [address] Phone: [phone]; Fax: [phone]

## 2022-02-25 ENCOUNTER — Telehealth: Payer: Self-pay | Admitting: Pharmacist

## 2022-02-25 ENCOUNTER — Ambulatory Visit: Payer: Medicare HMO | Attending: Physician Assistant | Admitting: Physician Assistant

## 2022-02-25 ENCOUNTER — Ambulatory Visit: Admit: 2022-02-25 | Payer: Medicare HMO

## 2022-02-25 ENCOUNTER — Telehealth: Payer: Self-pay | Admitting: Cardiology

## 2022-02-25 ENCOUNTER — Encounter: Payer: Self-pay | Admitting: Physician Assistant

## 2022-02-25 VITALS — BP 123/62 | HR 86 | Ht 65.0 in | Wt 263.4 lb

## 2022-02-25 DIAGNOSIS — I5032 Chronic diastolic (congestive) heart failure: Secondary | ICD-10-CM

## 2022-02-25 DIAGNOSIS — I10 Essential (primary) hypertension: Secondary | ICD-10-CM

## 2022-02-25 DIAGNOSIS — E782 Mixed hyperlipidemia: Secondary | ICD-10-CM

## 2022-02-25 DIAGNOSIS — R5382 Chronic fatigue, unspecified: Secondary | ICD-10-CM

## 2022-02-25 DIAGNOSIS — R9431 Abnormal electrocardiogram [ECG] [EKG]: Secondary | ICD-10-CM | POA: Diagnosis not present

## 2022-02-25 DIAGNOSIS — N1831 Chronic kidney disease, stage 3a: Secondary | ICD-10-CM | POA: Diagnosis not present

## 2022-02-25 DIAGNOSIS — M791 Myalgia, unspecified site: Secondary | ICD-10-CM | POA: Diagnosis not present

## 2022-02-25 NOTE — Telephone Encounter (Signed)
Received message that pt's insurance requires her to change from [place] to [place] in 2024. Will need new prior auth submitted after 03/09/22 when new formulary takes effect.

## 2022-02-25 NOTE — Assessment & Plan Note (Addendum)
Etiology not entirely clear.  She does have difficulty with her knees and needs to have knee replacements.  She arrives in a wheelchair today.  It is hard for her to get around.  Question if a lot of her symptoms are related to just deconditioning.  She is in normal sinus rhythm today.  She does take a lot of medications which may be contributing to her fatigue.  She does note that she feels better when she does not take her medications.  She is not certain if her CPAP is working correctly.  She is often somnolent.  I do not suspect that she is experiencing a cardiac cause for her symptoms.  As she does take high-dose diuretics, I will check labs today to make sure her renal function is stable as well as her electrolytes. Obtain CMET, CBC, magnesium Refer to Pharm.D. for med rec Follow-up with PCP to identify other causes F/u with pulmonologist to see if CPAP settings need to be adjusted.

## 2022-02-25 NOTE — Assessment & Plan Note (Signed)
Follow-up CMET today.

## 2022-02-25 NOTE — Patient Instructions (Signed)
Medication Instructions:  Your physician has recommended you make the following change in your medication:   STOP Fenofibrate for 2 weeks, if myalgias are better, call us, if no better, then restart, and let us know.    *If you need a refill on your cardiac medications before your next appointment, please call your pharmacy*   Lab Work: TODAY:  DIRECT LDL, LFT, CBC, & CK  If you have labs (blood work) drawn today and your tests are completely normal, you will receive your results only by: [place] (if you have MyChart) OR A paper copy in the mail If you have any lab test that is abnormal or we need to change your treatment, we will call you to review the results.   Testing/Procedures: None ordered   Follow-Up: At [facility], you and your health needs are our priority.  As part of our continuing mission to provide you with exceptional heart care, we have created designated Provider Care Teams.  These Care Teams include your primary Cardiologist (physician) and Advanced Practice Providers (APPs -  Physician Assistants and Nurse Practitioners) who all work together to provide you with the care you need, when you need it.  We recommend signing up for the patient portal called "MyChart".  Sign up information is provided on this After Visit Summary.  MyChart is used to connect with patients for Virtual Visits (Telemedicine).  Patients are able to view lab/test results, encounter notes, upcoming appointments, etc.  Non-urgent messages can be sent to your provider as well.   To learn more about what you can do with MyChart, go to [email or web address].    Your next appointment:   6 month(s)  The format for your next appointment:   In Person  Provider:   [name], MD      Other Instructions  MERRY CHRISTMAS!   Important Information About Sugar

## 2022-02-25 NOTE — Assessment & Plan Note (Signed)
NYHA III.  Her dyspnea is mainly related to function limitations related to COPD, deconditioning, obesity.  Volume status appears stable on exam today.  Her weights have been stable.  Obtain follow-up CMET, magnesium.  Continue furosemide 60 mg twice daily, metolazone 5 mg daily as needed, K+ 10 mEq 5 tabs (50 mEq) daily, spironolactone 25 mg daily.

## 2022-02-25 NOTE — Telephone Encounter (Signed)
Notified by [facility] this evening that patient had a blood glucose of 509 on her labs today.   Called patient who reported that she has been feeling fatigued, achey, and having somewhat blurry vision today. Reports that she has just felt "bad." Patient has not started any new medications or steroids that would increase her blood sugar, and she has not been checking her blood sugar regularly at home.   I explained to patient that having a blood sugar this high can be very dangerous. I encouraged patient to go to urgent care or the emergency department this evening for further evaluation. Patient voiced understanding and is agreeable to the plan.   Margie Billet, PA-C 02/25/2022 6:15 PM

## 2022-02-25 NOTE — Assessment & Plan Note (Signed)
As noted, her symptoms are likely related to multiple medication interactions.  I will have her hold her fenofibrate for 2 weeks to see if this helps.  If it does not, she knows to resume.  If it does help, she will contact us.  At that point, I would consider Vascepa.

## 2022-02-25 NOTE — Assessment & Plan Note (Signed)
Her drug plan is changing at the beginning of the year.  She would no longer have Praluent covered.  Obtain follow-up direct LDL today.  I have contacted our Pharm.D. to help arrange transitioning her to [place].

## 2022-02-25 NOTE — Assessment & Plan Note (Addendum)
Her QT is prolonged on EKG today.  As noted, I will check CMET, magnesium. I will have her DC Zofran for now. I called her after seh left the office and she confirmed that she does not take it. She does not have congenital Long QT. So albuterol will be ok. If her electrolytes are abnormal, will have to hold her Tramadol, Pantoprazole until corrected.

## 2022-02-26 ENCOUNTER — Telehealth: Payer: Self-pay | Admitting: Family Medicine

## 2022-02-26 LAB — COMPREHENSIVE METABOLIC PANEL
ALT: 17 IU/L (ref 0–32)
AST: 16 IU/L (ref 0–40)
Albumin/Globulin Ratio: 1.7 (ref 1.2–2.2)
Albumin: 4.2 g/dL (ref 3.9–4.9)
Alkaline Phosphatase: 87 IU/L (ref 44–121)
BUN/Creatinine Ratio: 17 (ref 12–28)
BUN: 27 mg/dL (ref 8–27)
Bilirubin Total: 0.3 mg/dL (ref 0.0–1.2)
CO2: 27 mmol/L (ref 20–29)
Calcium: 10.1 mg/dL (ref 8.7–10.3)
Chloride: 93 mmol/L — ABNORMAL LOW (ref 96–106)
Creatinine, Ser: 1.61 mg/dL — ABNORMAL HIGH (ref 0.57–1.00)
Globulin, Total: 2.5 g/dL (ref 1.5–4.5)
Glucose: 509 mg/dL (ref 70–99)
Potassium: 4.2 mmol/L (ref 3.5–5.2)
Sodium: 136 mmol/L (ref 134–144)
Total Protein: 6.7 g/dL (ref 6.0–8.5)
eGFR: 35 mL/min/{1.73_m2} — ABNORMAL LOW (ref >59)

## 2022-02-26 LAB — CBC
Hematocrit: 44.7 % (ref 34.0–46.6)
Hemoglobin: 14.1 g/dL (ref 11.1–15.9)
MCH: 28.5 pg (ref 26.6–33.0)
MCHC: 31.5 g/dL (ref 31.5–35.7)
MCV: 91 fL (ref 79–97)
Platelets: 275 10*3/uL (ref 150–450)
RBC: 4.94 x10E6/uL (ref 3.77–5.28)
RDW: 12.9 % (ref 11.7–15.4)
WBC: 9.9 10*3/uL (ref 3.4–10.8)

## 2022-02-26 LAB — CK: Total CK: 103 U/L (ref 32–182)

## 2022-02-26 LAB — LDL CHOLESTEROL, DIRECT: LDL Direct: 96 mg/dL (ref 0–99)

## 2022-02-26 LAB — MAGNESIUM: Magnesium: 2 mg/dL (ref 1.6–2.3)

## 2022-02-26 NOTE — Telephone Encounter (Signed)
Agreed on scheduling and restarting insulin.  Thanks.

## 2022-02-26 NOTE — Telephone Encounter (Signed)
Please triage patient about her sugar readings at home.  Recent 500.  Verify current trulicity and insulin dose/adherence.   If dehydrated, then needs ER eval.  Thanks.

## 2022-02-26 NOTE — Telephone Encounter (Signed)
Phone to pt per Dr. [name] request to follow up on elevated blood glucose 506.  Pt has orders for Trulicity 2.9NL Q week and Tresiba 120 units Q day and Dexcom CGM.  Pt reported that she hadn't taken her [place] x2 days.  When asked why she responded "I don't have a reason."  Pt has all medications and Dexcom in the home.  She reported that she "had been bad girl for a few days."  Restarted her [place] Aas last night and blood sugar was 409 before bed.  While on the phone with pt, writer asked to take blood sugar it was 369 at that time.  She is currently taking it with finger sticks because she needs to place a new sensor.  Pt reported that Dr. Kathlen Mody wanted her to talk with Dr. [name] about discounting unnecessary medications.  Appointment made with Dr. [name] for 03/12/22 @ 2:30p to follow up on blood sugar and medication discussion.

## 2022-03-03 ENCOUNTER — Ambulatory Visit
Admission: RE | Admit: 2022-03-03 | Discharge: 2022-03-03 | Disposition: A | Discharge status: Home / Self Care | Payer: Medicare HMO | Source: Ambulatory Visit | Attending: Emergency Medicine | Admitting: Emergency Medicine

## 2022-03-03 ENCOUNTER — Other Ambulatory Visit: Payer: Self-pay | Admitting: Family Medicine

## 2022-03-03 VITALS — BP 108/87 | HR 90 | Temp 97.9°F | Resp 22 | Ht 65.0 in | Wt 263.4 lb

## 2022-03-03 DIAGNOSIS — E1122 Type 2 diabetes mellitus with diabetic chronic kidney disease: Secondary | ICD-10-CM | POA: Diagnosis not present

## 2022-03-03 DIAGNOSIS — R9431 Abnormal electrocardiogram [ECG] [EKG]: Secondary | ICD-10-CM | POA: Insufficient documentation

## 2022-03-03 DIAGNOSIS — I130 Hypertensive heart and chronic kidney disease with heart failure and stage 1 through stage 4 chronic kidney disease, or unspecified chronic kidney disease: Secondary | ICD-10-CM | POA: Diagnosis not present

## 2022-03-03 DIAGNOSIS — N183 Chronic kidney disease, stage 3 unspecified: Secondary | ICD-10-CM | POA: Diagnosis not present

## 2022-03-03 DIAGNOSIS — Z794 Long term (current) use of insulin: Secondary | ICD-10-CM | POA: Diagnosis not present

## 2022-03-03 DIAGNOSIS — I5032 Chronic diastolic (congestive) heart failure: Secondary | ICD-10-CM | POA: Diagnosis not present

## 2022-03-03 DIAGNOSIS — U071 COVID-19: Secondary | ICD-10-CM | POA: Diagnosis not present

## 2022-03-03 DIAGNOSIS — J111 Influenza due to unidentified influenza virus with other respiratory manifestations: Secondary | ICD-10-CM | POA: Diagnosis not present

## 2022-03-03 DIAGNOSIS — B349 Viral infection, unspecified: Secondary | ICD-10-CM

## 2022-03-03 DIAGNOSIS — J441 Chronic obstructive pulmonary disease with (acute) exacerbation: Secondary | ICD-10-CM | POA: Diagnosis not present

## 2022-03-03 DIAGNOSIS — E1142 Type 2 diabetes mellitus with diabetic polyneuropathy: Secondary | ICD-10-CM | POA: Insufficient documentation

## 2022-03-03 MED ORDER — PREDNISONE 10 MG PO TABS: 40.0000 mg | ORAL_TABLET | Freq: Every day | ORAL | 0 refills | Status: AC

## 2022-03-03 NOTE — Telephone Encounter (Signed)
Refill request for gabapentin 300 mg capsule   LOV - 03/12/22 Next OV - 11/07/21 Last refill - 11/26/21 #90/1

## 2022-03-03 NOTE — ED Provider Notes (Signed)
Roderic Palau    CSN: [id number] Arrival date & time: 03/03/22  1512      History   Chief Complaint Chief Complaint  Patient presents with   Influenza    started fri. w stuffy head, down to scratchy throat, bad head aches thru neck and shoulders, now left hurts when swallowing. - Entered by patient    HPI SHAILAH GIBBINS is a 66 y.o. female.  Patient presents with 4 day history of headache, congestion, sore throat, cough, nausea.  No fever, rash, shortness of breath, chest pain, vomiting, diarrhea, or other symptoms.  Treatment at home with Tylenol and Zofran.  Her medical history includes COPD, asthma, diabetes, chronic heart failure, hypertension, prolonged QT, chronic fatigue, CKD.  Patient is on 2L O2 at home and is not wearing it now.    The history is provided by the patient and medical records.    Past Medical History:  Diagnosis Date   Anemia    Arthritis    Asthma    Chronic diastolic CHF 52/7782   Echocardiogram 05/2019: EF 70, no RWMA, mild LVH, Gr 1 DD, normal RVSF, severe LVH, borderline asc Aorta (39 mm)   CKD (chronic kidney disease)    Depression    Diabetes mellitus without complication (HCC)    Diverticulitis    Dyspnea    with exertion   GERD (gastroesophageal reflux disease)    History of blood transfusion    Hyperlipidemia    cannot tolerate statins   Hypertension    patient states she has never had high blood pressure.    Nuclear stress test    Myoview 05/2019: EF 83, no ischemia or infarction; Low Risk   Peripheral neuropathy    Pneumonia    PONV (postoperative nausea and vomiting)    RLS (restless legs syndrome)    Sleep apnea    uses CPAP   Ulcerative colitis [facility])    dr Collene Mares    Patient Active Problem List   Diagnosis Date Noted   Myalgia 02/25/2022   Prolonged Q-T interval on ECG 02/25/2022   Aortic atherosclerosis ([place]) 06/04/2021   Preoperative cardiovascular examination 06/04/2021   Hyperglycemia 02/14/2021   Rash 09/27/2020    COPD exacerbation ([place]) 09/21/2020   Acute exacerbation of COPD with asthma ([place]) 09/19/2020   Mixed diabetic hyperlipidemia associated with type 2 diabetes mellitus ([place]) 09/19/2020   Pain of foot 06/20/2020   Medicare annual wellness visit, initial 06/20/2020   Constipation 06/11/2020   Diverticulosis of colon 06/11/2020   Irritable bowel syndrome 42/35/3614   Periumbilical pain 43/15/4008   RLS (restless legs syndrome)    Asthma exacerbation 01/08/2020   Muscle weakness 01/08/2020   Grade I diastolic dysfunction 67/61/9509   Drug-induced myopathy 10/10/2019   Advance care planning 06/14/2019   RLQ abdominal pain 06/14/2019   COPD (chronic obstructive pulmonary disease) ([place]) 06/14/2019   Leukocytosis 06/14/2019   Hypercalcemia 06/14/2019   Gout 06/14/2019   Type 2 diabetes mellitus with hyperglycemia, without long-term current use of insulin (HCC)    COPD with acute exacerbation ([place]) 05/03/2017   Diabetes mellitus without complication (HCC)    CKD (chronic kidney disease) stage 3, GFR 30-59 ml/min (HCC)    Hypersomnia with sleep apnea 09/16/2015   Fatigue 06/07/2015   Morbid obesity ([place]) 06/07/2015   (HFpEF) heart failure with preserved ejection fraction (HCC)    Pneumonia of both lower lobes due to infectious organism 02/09/2013   OSA on CPAP 08/23/2012   Essential hypertension  Depression    GERD without esophagitis    Hyperlipidemia    Ulcerative colitis ([place])    Patellar tendinitis 05/25/2011   Knee pain 05/25/2011   Myofascial pain 05/25/2011   Cervicalgia 05/25/2011    Past Surgical History:  Procedure Laterality Date   ABDOMINAL HYSTERECTOMY     APPENDECTOMY     BIOPSY  10/10/2021   Procedure: BIOPSY;  Surgeon: Carol Ada, MD;  Location: [facility];  Service: Gastroenterology;;   BREAST BIOPSY Left    BREAST SURGERY     left biopsy   CARDIAC CATHETERIZATION     CESAREAN SECTION     CHOLECYSTECTOMY     COLONOSCOPY WITH PROPOFOL N/A 10/10/2021    Procedure: COLONOSCOPY WITH PROPOFOL;  Surgeon: Carol Ada, MD;  Location: [facility];  Service: Gastroenterology;  Laterality: N/A;   HERNIA REPAIR     INSERTION OF MESH N/A 11/15/2015   Procedure: INSERTION OF MESH;  Surgeon: [name], MD;  Location: [facility] ORS;  Service: General;  Laterality: N/A;   LAPAROSCOPIC LYSIS OF ADHESIONS N/A 11/15/2015   Procedure: LAPAROSCOPIC LYSIS OF ADHESIONS;  Surgeon: [name], MD;  Location: [facility];  Service: General;  Laterality: N/A;   RIGHT HEART CATHETERIZATION N/A 02/27/2013   Procedure: RIGHT HEART CATH;  Surgeon: Blane Ohara, MD;  Location[facility] CATH LAB;  Service: Cardiovascular;  Laterality: N/A;   RIGHT HEART CATHETERIZATION N/A 12/14/2013   Procedure: RIGHT HEART CATH;  Surgeon: Larey Dresser, MD;  Location: [facility] CATH LAB;  Service: Cardiovascular;  Laterality: N/A;   SIGMOIDECTOMY  2010   diverticular disease   VENTRAL HERNIA REPAIR N/A 11/15/2015   Procedure: LAPAROSCOPIC VENTRAL WALL HERNIA REPAIR;  Surgeon: [name], MD;  Location: [facility];  Service: General;  Laterality: N/A;    OB History     Gravida  2   Para  1   Term      Preterm      AB  1   Living         SAB      IAB      Ectopic      Multiple      Live Births               Home Medications    Prior to Admission medications   Medication Sig Start Date End Date Taking? Authorizing Provider  predniSONE (DELTASONE) 10 MG tablet Take 4 tablets (40 mg total) by mouth daily for 5 days. 03/03/22 03/08/22 Yes Sharion Balloon, NP  acetaminophen (TYLENOL) 500 MG tablet Take 500 mg by mouth every 6 (six) hours as needed for moderate pain.    [provider]  albuterol (VENTOLIN HFA) 108 (90 Base) MCG/ACT inhaler INHALE TWO PUFFS BY MOUTH INTO LUNGS every SIX hours AS NEEDED 11/26/21   [name], MD  Alirocumab (PRALUENT) 150 MG/ML SOAJ Inject 1 pen. into the skin every 14 (fourteen) days. 07/02/21   Supple, Megan E, RPH-CPP  aspirin  EC 81 MG tablet Take 1 tablet (81 mg total) by mouth daily. Swallow whole. 06/04/21   Richardson Dopp T, PA-C  BAYER CONTOUR NEXT TEST test strip 1 each by Other route daily. As directed 07/30/15   [provider]  budesonide (PULMICORT) 0.5 MG/2ML nebulizer solution Take 2 mLs (0.5 mg total) by nebulization 2 (two) times daily. 05/23/20   [name], MD  buPROPion (WELLBUTRIN XL) 300 MG 24 hr tablet TAKE ONE TABLET BY MOUTH EVERY MORNING 02/03/22  [name], MD  cetirizine (ZYRTEC) 10 MG tablet Take 10 mg by mouth at bedtime.    [provider]  Cholecalciferol (VITAMIN D-3) 5000 UNITS TABS Take 5,000 Units by mouth every other day.     [provider]  colchicine 0.6 MG tablet TAKE 1 TABLET (0.6 MG TOTAL) BY MOUTH DAILY AS NEEDED (FOR GOUT) 01/19/22   [name], MD  Continuous Blood Gluc Receiver ([place]) [place] by Does not apply route.    [provider]  Continuous Blood Gluc Sensor ([place]) MISC by Does not apply route. Apply sensor every 10 days    [provider]  dicyclomine (BENTYL) 20 MG tablet TAKE 1 TABLET UP TO FOUR TIMES A DAY FOR GI CRAMPING, PAIN, NAUSEA, AND VOMITING AS NEEDED 03/14/21   [name], MD  Dulaglutide (TRULICITY) 4.5 UY/4.0HK SOPN INJECT 4.5 MG ONCE WEEKLY 02/13/22   [name], MD  DULoxetine (CYMBALTA) 60 MG capsule TAKE 1 CAPSULE BY MOUTH ONCE DAILY. 03/28/21   [name], MD  estradiol (ESTRACE) 0.1 MG/GM vaginal cream Place 1 Applicatorful vaginally 3 (three) times a week.    [provider]  fluticasone (FLONASE) 50 MCG/ACT nasal spray Place 2 sprays into both nostrils daily. 02/08/21   Deatra Canter, Amjad, PA-C  formoterol (PERFOROMIST) 20 MCG/2ML nebulizer solution Take 2 mLs (20 mcg total) by nebulization 2 (two) times daily. 08/27/21   [name], MD  furosemide (LASIX) 20 MG tablet Take 3 tablets (60 mg total) by mouth 2 (two) times daily. 11/26/21   Richardson Dopp T, PA-C   gabapentin (NEURONTIN) 300 MG capsule Take 1 capsule (300 mg total) by mouth at bedtime. With second dose per day if needed. 11/26/21   [name], MD  GLOBAL EASE INJECT PEN NEEDLES 32G X 4 MM MISC USE AS DIRECTED WITH VICTOZA AND TRESIBA. 08/12/20   [name], MD  insulin degludec (TRESIBA FLEXTOUCH) 200 UNIT/ML FlexTouch Pen Inject 120 Units into the skin daily.    [provider]  ipratropium-albuterol (DUONEB) 0.5-2.5 (3) MG/3ML SOLN Take 3 mLs by nebulization every 6 (six) hours as needed (wheezing). 02/10/21   [name], MD  Iron, Ferrous Sulfate, 325 (65 Fe) MG TABS Take 325 mg by mouth 3 (three) times a week. 11/07/21   [name], MD  meloxicam (MOBIC) 15 MG tablet Take 7.5 mg by mouth daily as needed for pain.    [provider]  metolazone (ZAROXOLYN) 5 MG tablet TAKE 1 TABLET BY MOUTH EVERY DAY AS NEEDED 12/01/21   [name], MD  MICROLET LANCETS MISC 1 each by Other route. As directed 07/30/15   [provider]  nitrofurantoin (MACRODANTIN) 50 MG capsule Take 50 mg by mouth daily. 10/08/21   [provider]  nystatin (MYCOSTATIN/NYSTOP) powder Apply 1 application topically 3 (three) times daily. 09/26/20   [name], MD  ondansetron (ZOFRAN) 4 MG tablet TAKE ONE TABLET BY MOUTH every EIGHT hours AS NEEDED FOR NAUSEA AND VOMITING 06/11/21   [name], MD  OXYGEN Inhale 2 L into the lungs continuous.    [provider]  pantoprazole (PROTONIX) 40 MG tablet TAKE ONE TABLET BY MOUTH EVERY MORNING 11/26/21   [name], MD  polyethylene glycol [facility]) packet Take 17 g by mouth daily as needed for mild constipation.    [provider]  potassium chloride (MICRO-K) 10 MEQ CR capsule Take 5 capsules (  50 mEq total) by mouth daily. 12/10/21   [name], MD  primidone (MYSOLINE) 50 MG tablet Take 0.25 tablets by mouth daily.    [provider]  revefenacin (YUPELRI) 175 MCG/3ML  nebulizer solution Inhale one vial in nebulizer once daily. Do not mix with other nebulized medications. 05/26/21   [name], MD  rOPINIRole (REQUIP) 4 MG tablet TAKE 1 TABLET BY MOUTH TWICE DAILY 03/28/21   [name], MD  simethicone (GAS-X) 80 MG chewable tablet Chew 1 tablet (80 mg total) by mouth every 6 (six) hours as needed for flatulence. 11/28/21   [name], MD  spironolactone (ALDACTONE) 25 MG tablet TAKE ONE TABLET BY MOUTH ONCE DAILY 11/26/21   [name], MD  traMADol (ULTRAM) 50 MG tablet Take 1 tablet (50 mg total) by mouth 3 (three) times daily as needed. 11/07/21   [name], MD  UNABLE TO FIND CPAP- At bedtime    [provider]    Family History Family History  Problem Relation Age of Onset   Heart disease Mother    Hypertension Mother    Dementia Mother    Heart disease Father    COPD Father    Hypertension Father    Heart disease Brother    Other Brother        knee replacement; hip replacement   Other Brother        cant brathe when laying down   Diabetes Maternal Grandmother    Breast cancer Neg Hx    Colon cancer Neg Hx     Social History Social History   Tobacco Use   Smoking status: Never    Passive exposure: Past   Smokeless tobacco: Never  Vaping Use   Vaping Use: Never used  Substance Use Topics   Alcohol use: No   Drug use: No     Allergies   Almond oil, Morphine and related, Atorvastatin, Ceclor [cefaclor], Sulfa antibiotics, Ciprofloxacin, Levaquin [levofloxacin], Losartan, and Statins   Review of Systems Review of Systems  Constitutional:  Negative for chills and fever.  HENT:  Positive for congestion and sore throat. Negative for ear pain.   Respiratory:  Positive for cough. Negative for shortness of breath.   Cardiovascular:  Negative for chest pain and palpitations.  Gastrointestinal:  Positive for nausea. Negative for abdominal pain, diarrhea and vomiting.  Skin:  Negative for color change and  rash.  Neurological:  Positive for headaches.  All other systems reviewed and are negative.    Physical Exam Triage Vital Signs ED Triage Vitals [03/03/22 1526]  Enc Vitals Group     BP      Pulse      Resp      Temp      Temp src      SpO2      Weight 263 lb 6.1 oz (119.5 kg)     Height 5' 5"  (1.651 m)     Head Circumference      Peak Flow      Pain Score 8     Pain Loc      Pain Edu?      Excl. in [place]?    No data found.  Updated Vital Signs BP 108/87   Pulse 90   Temp 97.9 F (36.6 C)   Resp (!) 22   Ht 5' 5"  (1.651 m)   Wt 263 lb 6.1 oz (119.5 kg)   SpO2 95%   BMI 43.83  kg/m   Visual Acuity Right Eye Distance:   Left Eye Distance:   Bilateral Distance:    Right Eye Near:   Left Eye Near:    Bilateral Near:     Physical Exam Vitals and nursing note reviewed.  Constitutional:      General: She is not in acute distress.    Appearance: She is well-developed. She is obese. She is ill-appearing.  HENT:     Right Ear: Tympanic membrane normal.     Left Ear: Tympanic membrane normal.     Nose: Nose normal.     Mouth/Throat:     Mouth: Mucous membranes are moist.     Pharynx: Oropharynx is clear.  Eyes:     Conjunctiva/sclera: Conjunctivae normal.  Cardiovascular:     Rate and Rhythm: Normal rate and regular rhythm.     Heart sounds: Normal heart sounds.  Pulmonary:     Effort: Pulmonary effort is normal. No respiratory distress.     Breath sounds: Normal breath sounds. No wheezing, rhonchi or rales.  Musculoskeletal:     Cervical back: Neck supple.  Skin:    General: Skin is warm and dry.  Neurological:     Mental Status: She is alert.  Psychiatric:        Mood and Affect: Mood normal.        Behavior: Behavior normal.      UC Treatments / Results  Labs (all labs ordered are listed, but only abnormal results are displayed) Labs Reviewed  SARS CORONAVIRUS 2 (TAT 6-24 HRS)    EKG   Radiology No results  found.  Procedures Procedures (including critical care time)  Medications Ordered in UC Medications - No data to display  Initial Impression / Assessment and Plan / UC Course  I have reviewed the triage vital signs and the nursing notes.  Pertinent labs & imaging results that were available during my care of the patient were reviewed by me and considered in my medical decision making (see chart for details).   Viral illness, COPD exacerbation.  O2 sat 91% on room air on arrival; patient is on O2 at home but was not wearing it;  O2 2L via [place] applied and sat increased to 95%.  COVID pending.  If positive, would recommend treatment with molnupiravir. Last GFR 38 on 12/04/2021.  Treating COPD with prednisone and continued use of albuterol inhaler.  Instructed patient to follow up with her PCP if her symptoms are not improving.  ED precautions discussed.  Education provide on viral illness and COPD exacerbation.  She agrees to plan of care.    Final Clinical Impressions(s) / UC Diagnoses   Final diagnoses:  Viral illness  COPD exacerbation ([place])     Discharge Instructions      Take the prednisone as directed.  Continue your albuterol inhaler as directed.    Your COVID test is pending.    Take Tylenol as needed for fever or discomfort.  Rest and keep yourself hydrated.    Follow-up with your primary care provider if your symptoms are not improving.         ED Prescriptions     Medication Sig Dispense Auth. Provider   predniSONE (DELTASONE) 10 MG tablet Take 4 tablets (40 mg total) by mouth daily for 5 days. 20 tablet Sharion Balloon, NP      PDMP not reviewed this encounter.   Sharion Balloon, NP 03/03/22 1610

## 2022-03-03 NOTE — Discharge Instructions (Addendum)
Take the prednisone as directed.  Continue your albuterol inhaler as directed.    Your COVID test is pending.    Take Tylenol as needed for fever or discomfort.  Rest and keep yourself hydrated.    Follow-up with your primary care provider if your symptoms are not improving.

## 2022-03-03 NOTE — ED Triage Notes (Signed)
Patient to Urgent Care with complaints of head congestion, scratchy throat, headaches that radiate into neck and shoulders. Productive cough. Reports her left ear of hurts when swallowing.   Possible low grade fevers.  Took zofran this morning due to nausea. Has also been taking tylenol.   Symptoms started 12/22.

## 2022-03-05 LAB — SARS CORONAVIRUS 2 (TAT 6-24 HRS): SARS Coronavirus 2: POSITIVE — AB

## 2022-03-05 NOTE — Telephone Encounter (Signed)
Sent. Thanks.   

## 2022-03-05 NOTE — Telephone Encounter (Addendum)
-----   Message from [place], [place] sent at 03/05/2022  9:17 AM EST ----- [facility] in need of new RX for Gabapentin 319m, take 1 at bedtime, may use another dose if needed.   Thanks     [place] [phone]    Sending note to Dr DDamita Dunningswho is out of office and JJanett BillowCMA.

## 2022-03-06 ENCOUNTER — Telehealth: Payer: Self-pay

## 2022-03-06 DIAGNOSIS — G4733 Obstructive sleep apnea (adult) (pediatric): Secondary | ICD-10-CM | POA: Diagnosis not present

## 2022-03-06 MED ORDER — BENZONATATE 200 MG PO CAPS: 200.0000 mg | ORAL_CAPSULE | Freq: Three times a day (TID) | ORAL | 1 refills | Status: DC | PRN

## 2022-03-06 MED ORDER — MOLNUPIRAVIR EUA 200MG CAPSULE: 4.0000 | ORAL_CAPSULE | Freq: Two times a day (BID) | ORAL | 0 refills | Status: AC

## 2022-03-06 NOTE — Telephone Encounter (Signed)
Given her situation, it may be low yield but I would still try taking molnupiravir.  She may get some benefit at low risk of med use.  Thanks.

## 2022-03-06 NOTE — Telephone Encounter (Signed)
Called patient reviewed all information and repeated back to me. Will call if any questions.  She will start medication today

## 2022-03-06 NOTE — Telephone Encounter (Signed)
Patient went to [facility] 03/03/22 and was tested for covid. The test ended up coming back positive. She was given prednisone while there and has one more day left. Patient states the mucus in her chest/nose is just not breaking up much. She does get some green chunks up when she coughs and has an ear ache. She has been trying to take mucinex too for ex but wants to know if something else can be sent in for her?

## 2022-03-06 NOTE — Addendum Note (Signed)
Addended by: [name] on: 03/06/2022 11:46 AM   Modules accepted: Orders

## 2022-03-06 NOTE — Telephone Encounter (Signed)
Patient states that symptoms started 7 days ago. She has not started Molnupiravir. Because it has been outside of the 5 day window advised not to start. That if you recommend her still taking we will call back. She will start on cough medications and let us know if any changes. She is aware of red words and will go to ED over weekend if has any.

## 2022-03-06 NOTE — Telephone Encounter (Signed)
Did she start antiviral med in the meantime?  Note states "If positive, would recommend treatment with molnupiravir."  I sent the rx.    Would try tessalon for the cough, rx sent.    If worse in the meantime, then needs recheck.    Thanks.

## 2022-03-08 ENCOUNTER — Other Ambulatory Visit: Payer: Self-pay | Admitting: Family Medicine

## 2022-03-08 DIAGNOSIS — Z794 Long term (current) use of insulin: Secondary | ICD-10-CM

## 2022-03-10 ENCOUNTER — Telehealth: Payer: Self-pay | Admitting: Pharmacist

## 2022-03-10 MED ORDER — REPATHA SURECLICK 140 MG/ML ~~LOC~~ SOAJ: 1.0000 | SUBCUTANEOUS | 11 refills | Status: DC

## 2022-03-10 NOTE — Telephone Encounter (Signed)
Pt's insurance changing from [employer] to [employer] for this year. Prior auth submitted, Key: [id number]. Approved through 03/09/23. Called pt, her husband will make her aware.

## 2022-03-11 ENCOUNTER — Other Ambulatory Visit: Payer: Self-pay | Admitting: Family Medicine

## 2022-03-11 DIAGNOSIS — E1165 Type 2 diabetes mellitus with hyperglycemia: Secondary | ICD-10-CM

## 2022-03-12 ENCOUNTER — Ambulatory Visit (INDEPENDENT_AMBULATORY_CARE_PROVIDER_SITE_OTHER): Payer: Medicare HMO | Admitting: Family Medicine

## 2022-03-12 ENCOUNTER — Telehealth: Payer: Self-pay

## 2022-03-12 ENCOUNTER — Encounter: Payer: Self-pay | Admitting: Family Medicine

## 2022-03-12 ENCOUNTER — Ambulatory Visit (INDEPENDENT_AMBULATORY_CARE_PROVIDER_SITE_OTHER)
Admission: RE | Admit: 2022-03-12 | Discharge: 2022-03-12 | Disposition: A | Discharge status: Home / Self Care | Payer: Medicare HMO | Source: Ambulatory Visit | Attending: Family Medicine | Admitting: Family Medicine

## 2022-03-12 VITALS — BP 120/58 | HR 83 | Temp 98.3°F | Ht 65.0 in | Wt 275.0 lb

## 2022-03-12 DIAGNOSIS — R059 Cough, unspecified: Secondary | ICD-10-CM | POA: Diagnosis not present

## 2022-03-12 MED ORDER — AMOXICILLIN-POT CLAVULANATE 875-125 MG PO TABS: 1.0000 | ORAL_TABLET | Freq: Two times a day (BID) | ORAL | 0 refills | Status: DC

## 2022-03-12 NOTE — Progress Notes (Signed)
Follow-up for cough.  Still on baseline nebs.  Sinus drainage and scratchy throat.  Then chest congestion.  Post nasal gtt.  Positive for covid 2-3 weeks ago, but sx predate that.  She took molnupiravir after dx.  Still with ear pain, facial pain.  Sputum is yellow/green.  Dark rhinorrhea.    Meds, vitals, and allergies reviewed.   ROS: Per HPI unless specifically indicated in ROS section   In wheelchair.   GEN: nad, alert and oriented HEENT: mucous membranes moist, tm w/o erythema, nasal exam w/o erythema, clear discharge noted,  OP with cobblestoning NECK: supple w/o LA CV: rrr.   PULM: Diffuse rhonchi.  Without focal decrease in breath sounds, no inc wob EXT: no edema SKIN: no acute rash  She can tolerate augmentin.  D/w pt.

## 2022-03-12 NOTE — Patient Instructions (Signed)
Go to the lab on the way out.   If you have mychart we'll likely use that to update you.    Start augmentin and keep using your nebs.   Update [place] if worse in the meantime.  Take care.  Glad to see you.

## 2022-03-12 NOTE — Progress Notes (Addendum)
Care Management & Coordination Services Pharmacy Team  Reason for Encounter: Appointment Reminder  Contacted patient on 03/12/2022   Recent office visits:  03/06/22- Phillip Heal Duncan,MD(PCP)- phone encounter -covid results positive-start molnupiravir.  Recent consult visits:  02/25/22-[name],Pa(cardio)-f/u CHF,labs,I will have her DC Zofran for now.  albuterol will be ok. If her electrolytes are abnormal, will have to hold her Tramadol, Pantoprazole until corrected.  f/u 6 months  Hospital visits:  None in previous 6 months 03/03/22- [facility]- viril illness- labs, tested, start prednisone 10mg  for 5 days   Medications: Outpatient Encounter Medications as of 03/12/2022  Medication Sig   acetaminophen (TYLENOL) 500 MG tablet Take 500 mg by mouth every 6 (six) hours as needed for moderate pain.   albuterol (VENTOLIN HFA) 108 (90 Base) MCG/ACT inhaler INHALE TWO PUFFS BY MOUTH INTO LUNGS every SIX hours AS NEEDED   aspirin EC 81 MG tablet Take 1 tablet (81 mg total) by mouth daily. Swallow whole.   BAYER CONTOUR NEXT TEST test strip 1 each by Other route daily. As directed   benzonatate (TESSALON) 200 MG capsule Take 1 capsule (200 mg total) by mouth 3 (three) times daily as needed.   budesonide (PULMICORT) 0.5 MG/2ML nebulizer solution Take 2 mLs (0.5 mg total) by nebulization 2 (two) times daily.   buPROPion (WELLBUTRIN XL) 300 MG 24 hr tablet TAKE ONE TABLET BY MOUTH EVERY MORNING   cetirizine (ZYRTEC) 10 MG tablet Take 10 mg by mouth at bedtime.   Cholecalciferol (VITAMIN D-3) 5000 UNITS TABS Take 5,000 Units by mouth every other day.    colchicine 0.6 MG tablet TAKE 1 TABLET (0.6 MG TOTAL) BY MOUTH DAILY AS NEEDED (FOR GOUT)   Continuous Blood Gluc Receiver ([place]) DEVI by Does not apply route.   Continuous Blood Gluc Sensor (DEXCOM G7 SENSOR) MISC by Does not apply route. Apply sensor every 10 days   dicyclomine (BENTYL) 20 MG tablet TAKE 1 TABLET UP TO FOUR  TIMES A DAY FOR GI CRAMPING, PAIN, NAUSEA, AND VOMITING AS NEEDED   Dulaglutide (TRULICITY) 4.5 BT/5.1VO SOPN INJECT 4.5 MG ONCE WEEKLY   DULoxetine (CYMBALTA) 60 MG capsule TAKE 1 CAPSULE BY MOUTH ONCE DAILY.   estradiol (ESTRACE) 0.1 MG/GM vaginal cream Place 1 Applicatorful vaginally 3 (three) times a week.   Evolocumab (REPATHA SURECLICK) 160 MG/ML SOAJ Inject 140 mg into the skin every 14 (fourteen) days.   fluticasone (FLONASE) 50 MCG/ACT nasal spray Place 2 sprays into both nostrils daily.   formoterol (PERFOROMIST) 20 MCG/2ML nebulizer solution Take 2 mLs (20 mcg total) by nebulization 2 (two) times daily.   furosemide (LASIX) 20 MG tablet Take 3 tablets (60 mg total) by mouth 2 (two) times daily.   gabapentin (NEURONTIN) 300 MG capsule TAKE ONE CAPSULE BY MOUTH EVERYDAY AT BEDTIME May take additional capsule daily if needed   GLOBAL EASE INJECT PEN NEEDLES 32G X 4 MM MISC USE AS DIRECTED WITH VICTOZA AND TRESIBA.   insulin degludec (TRESIBA FLEXTOUCH) 200 UNIT/ML FlexTouch Pen Inject 120 Units into the skin daily.   ipratropium-albuterol (DUONEB) 0.5-2.5 (3) MG/3ML SOLN Take 3 mLs by nebulization every 6 (six) hours as needed (wheezing).   Iron, Ferrous Sulfate, 325 (65 Fe) MG TABS Take 325 mg by mouth 3 (three) times a week.   meloxicam (MOBIC) 15 MG tablet Take 7.5 mg by mouth daily as needed for pain.   metolazone (ZAROXOLYN) 5 MG tablet TAKE 1 TABLET BY MOUTH EVERY DAY AS NEEDED  MICROLET LANCETS MISC 1 each by Other route. As directed   nitrofurantoin (MACRODANTIN) 50 MG capsule Take 50 mg by mouth daily.   nystatin (MYCOSTATIN/NYSTOP) powder Apply 1 application topically 3 (three) times daily.   ondansetron (ZOFRAN) 4 MG tablet TAKE ONE TABLET BY MOUTH every EIGHT hours AS NEEDED FOR NAUSEA AND VOMITING   OXYGEN Inhale 2 L into the lungs continuous.   pantoprazole (PROTONIX) 40 MG tablet TAKE ONE TABLET BY MOUTH EVERY MORNING   polyethylene glycol (MIRALAX / GLYCOLAX) packet Take  17 g by mouth daily as needed for mild constipation.   potassium chloride (MICRO-K) 10 MEQ CR capsule Take 5 capsules (50 mEq total) by mouth daily.   primidone (MYSOLINE) 50 MG tablet Take 0.25 tablets by mouth daily.   revefenacin (YUPELRI) 175 MCG/3ML nebulizer solution Inhale one vial in nebulizer once daily. Do not mix with other nebulized medications.   rOPINIRole (REQUIP) 4 MG tablet TAKE 1 TABLET BY MOUTH TWICE DAILY   simethicone (GAS-X) 80 MG chewable tablet Chew 1 tablet (80 mg total) by mouth every 6 (six) hours as needed for flatulence.   spironolactone (ALDACTONE) 25 MG tablet TAKE ONE TABLET BY MOUTH ONCE DAILY   traMADol (ULTRAM) 50 MG tablet Take 1 tablet (50 mg total) by mouth 3 (three) times daily as needed.   UNABLE TO FIND CPAP- At bedtime   No facility-administered encounter medications on file as of 03/12/2022.   Lab Results  Component Value Date/Time   HGBA1C 7.6 (H) 11/07/2021 03:47 PM   HGBA1C 9.0 (A) 05/14/2021 03:10 PM   HGBA1C 10.9 (H) 02/14/2021 06:59 AM    BP Readings from Last 3 Encounters:  03/03/22 108/87  02/25/22 123/62  11/26/21 126/62    Patient contacted to confirm telephone appointment with [name],   PharmD, on 15/2024 at 1:00.  Do you have any problems getting your medications? No  What is your top health concern you would like to discuss at your upcoming visit?  Patient very weak and has no taste  Have you seen any other providers since your last visit with PCP? No  12/.26/23- [facility]- viral illness- tested- treat with prednisone 10mg  for 5 days     Star Rating Drugs:  Medication:  Last Fill: [employer] Trulicity  64/40/34 84 tresiba Care Gaps: Annual wellness visit in last year? Yes  If Diabetic: Last eye exam / retinopathy screening:2021 Last diabetic foot exam:2022   [name], CPP notified  [name], [place]  [phone]

## 2022-03-13 ENCOUNTER — Telehealth: Payer: Medicare HMO

## 2022-03-13 ENCOUNTER — Telehealth: Payer: Self-pay

## 2022-03-13 ENCOUNTER — Ambulatory Visit: Payer: Medicare HMO | Admitting: Pharmacist

## 2022-03-13 DIAGNOSIS — R918 Other nonspecific abnormal finding of lung field: Secondary | ICD-10-CM

## 2022-03-13 DIAGNOSIS — R911 Solitary pulmonary nodule: Secondary | ICD-10-CM

## 2022-03-13 MED ORDER — DOXYCYCLINE HYCLATE 100 MG PO TABS: 100.0000 mg | ORAL_TABLET | Freq: Two times a day (BID) | ORAL | 0 refills | Status: DC

## 2022-03-13 NOTE — Telephone Encounter (Signed)
D/w pt about CXR and adding doxycycline.  Continue augmentin.  When I called the patient she was not feeling worse and was still okay for outpatient follow-up.  Will need CXR in about 1 month for follow-up of opacity.  Needs CT re: nodule.  Discussed with patient that this was done to make sure she did not have an ominous process.  At this point there is no reason to suspect an ominous issue-but it is still reasonable to get the CT done.  D/w pt.  Will arrange orders.

## 2022-03-13 NOTE — Progress Notes (Unsigned)
Care Management & Coordination Services Pharmacy Note  03/13/2022 Name:  Kathleen Hatfield MRN:  [id number] DOB:  07/08/1955  Summary: F/u VISIT -DM: A1c 7.6% (11/2021), improved from 9%; pt is wearing Dexcom and reports few alarms lately (meaning BG stays between 70-250 much of the time). She had high glucose on recent BMP (509) but she had stopped taking insulin for a few days prior (no real reason for stopping, she just didn't take it); she is now back on insulin as prescribed -HLD: LDL 96 (02/2022), improved from 208 with Praluent. She is now switching to Repatha in 2024 due to insurance coverage. Fenofibrate is also on hold per cardiology due to concern for myalgias - pt notes myalgias are the same but sinus issues/runny nose has improved off fenofibrate -COPD: Pt is currently being treated for COPD exacerbation/COVID; she continues regularly maintenance inhalers/nebs   Recommendations/Changes made from today's visit: -Reviewed importance of compliance with insulin  Follow up plan: -Health Concierge will call patient 1 month for medication coordination -Pharmacist follow up televisit scheduled for 3 months -Endocrine appt 03/31/22 (TOC - Dr Lonzo CloudShamleffer)    Subjective: Kathleen Hatfield is an 67 y.o. year old female who is a primary patient of Para MarchDuncan, Dwana CurdGraham S, MD.  The care coordination team was consulted for assistance with disease management and care coordination needs.    Engaged with patient by telephone for follow up visit.  Patient Care Team: Joaquim Namuncan, Graham S, MD as PCP - General (Family Medicine) Tonny Bollmanooper, Michael, MD as PCP - Cardiology (Cardiology) Storm FriskWright, Patrick E, MD as Consulting Physician (Pulmonary Disease) Karie SodaGross, Steven, MD as Consulting Physician (General Surgery) de FieldaleDios, Hilton CorkJose Angelo A, MD (Inactive) as Consulting Physician (Pulmonary Disease) Kathyrn SheriffFoltanski, Ben Habermann N, [facility] as Pharmacist (Pharmacist) Kennon RoundsWeaver, Scott T, PA-C as Physician Assistant (Cardiology)  Recent office  visits: 03/12/22 Dr Para Marchuncan OV: cough - rx Augmentin.  11/07/21 Dr Para Marchuncan OV: annual - A1c 7.6%. Referred to endocrine and neurology.   03/11/21 - Crawford GivensGraham Duncan, MD, PCP - Pt presented for diabetes follow up. Increase insulin by 2 units/day for BP > 150. If you have more ankle swelling take 1 dose of metolazone.  Update me about your breathing and sugar next week.   Recent consult visits: 03/03/22 Urgent Care: COPD exacerbation. Rx prednisone. Covid positive, rx molnupravir.   02/25/22 PA Tereso NewcomerScott Weaver (Cardiology): f/u HF - glucose 509. D/C fenofibrate x 2 wks (myalgias). QT prolonged today, check labs. D/c zofran. Transitioning to Repatha 2024 d/t formulary change.  11/26/21 Dr Alben SpittleWeaver (Cardiology): f/u HF - increase furosemide to 60 mg BID. Increase fenofibrate to 145 mg. Discussed SGLT2-I - she does have hx of recurrent UTI so deferred.   11/25/21 Dr Tat (Neurology): tremor - not noted on exam, no evidence essential tremor. COPD meds may be driving tremor. Try primidone 50 mg. For RLS - ropinirole needs to be decreased - trial decrease to 2 mg AM, 4 mg PM x 2 weeks, then 2 mg BID x 2 weeks, then 2 mg only HS. Trial gabapentin 300 mg BID at same time to compensate.    10/30/21 Urgent Care - Pyruria, myalgia. Rx'd Keflex. Urine cx was negative, advised to d/c abx.   09/03/21 Cardiology TE - discuss lipid results on Praluent. Not at goal yet, pt declines additional med. Recheck lipids 6 weeks. 08/27/21-[name],MD (Pulmoanry): f/u sleep apnea-restart perforomist-Use yupelri in your nebulizer daily -previously seen by Dr. Lurena Joinerebecca Tat [facility]; advised her to contact neurology if her tremor  progressf/u 4 months 07/02/21 Margaretmary Dys PharmD (Lipid clinic): LDL 208. Start Praluent 150 mg q14d. 06/04/21-[name],PA(Cardiology)-F/U CHF-Recent creatinine stable. Arrange fasting Lipids(abnormal)  Refer to [facility] Lipid Clinic. Start ASA 81 mg once daily. 05/14/21 Dr Everardo All (Endocrine):  Establish  care DM. Advised mealtime insulin, pt declined. Increased Tresiba to 120 units.  Hospital visits: None in previous 6 months   Objective:  Lab Results  Component Value Date   CREATININE 1.61 (H) 02/25/2022   BUN 27 02/25/2022   GFR 48.76 (L) 02/27/2021   EGFR 35 (L) 02/25/2022   GFRNONAA 48 (L) 03/31/2021   GFRAA 69 01/19/2020   NA 136 02/25/2022   K 4.2 02/25/2022   CALCIUM 10.1 02/25/2022   CO2 27 02/25/2022   GLUCOSE 509 (HH) 02/25/2022    Lab Results  Component Value Date/Time   HGBA1C 7.6 (H) 11/07/2021 03:47 PM   HGBA1C 9.0 (A) 05/14/2021 03:10 PM   HGBA1C 10.9 (H) 02/14/2021 06:59 AM   GFR 48.76 (L) 02/27/2021 12:16 PM   GFR 38.07 (L) 11/28/2020 03:56 PM    Last diabetic Eye exam:  Lab Results  Component Value Date/Time   HMDIABEYEEXA No Retinopathy 12/22/2019 12:00 AM    Last diabetic Foot exam: No results found for: "HMDIABFOOTEX"   Lab Results  Component Value Date   CHOL 202 (H) 11/25/2021   HDL 47 11/25/2021   LDLCALC 105 (H) 11/25/2021   LDLDIRECT 96 02/25/2022   TRIG 297 (H) 11/25/2021   CHOLHDL 4.3 11/25/2021       Latest Ref Rng & Units 02/25/2022   10:31 AM 11/25/2021   12:58 PM 11/07/2021    3:47 PM  Hepatic Function  Total Protein 6.0 - 8.5 g/dL 6.7  7.2  6.8   Albumin 3.9 - 4.9 g/dL 4.2  4.6    AST 0 - 40 IU/L 16  21  18    ALT 0 - 32 IU/L 17  18  16    Alk Phosphatase 44 - 121 IU/L 87  98    Total Bilirubin 0.0 - 1.2 mg/dL 0.3  0.5  0.3     Lab Results  Component Value Date/Time   TSH 3.14 11/07/2021 03:47 PM   TSH 2.850 06/09/2019 12:50 PM       Latest Ref Rng & Units 02/25/2022   10:31 AM 11/07/2021    3:47 PM 03/31/2021    6:06 PM  CBC  WBC 3.4 - 10.8 x10E3/uL 9.9  10.5  9.7   Hemoglobin 11.1 - 15.9 g/dL 01/07/2022  04/02/2021  75.6   Hematocrit 34.0 - 46.6 % 44.7  40.5  41.6   Platelets 150 - 450 x10E3/uL 275  325  328     Lab Results  Component Value Date/Time   VD25OH 45.52 06/18/2020 04:52 PM   VD25OH 47.60 06/22/2019 01:59 PM    VITAMINB12 384 09/16/2008 04:20 AM    Clinical ASCVD: No  The 10-year ASCVD risk score (Arnett DK, et al., 2019) is: 16%   Values used to calculate the score:     Age: 68 years     Sex: Female     Is Non-Hispanic African American: No     Diabetic: Yes     Tobacco smoker: No     Systolic Blood Pressure: 120 mmHg     Is BP treated: Yes     HDL Cholesterol: 47 mg/dL     Total Cholesterol: 202 mg/dL       2020   79 AM 08/12/2020  1:54 PM 12/28/2017    2:09 PM  Depression screen PHQ 2/9  Decreased Interest 0 3 1  Down, Depressed, Hopeless 1 1 1   PHQ - 2 Score 1 4 2   Altered sleeping 2 2 3   Tired, decreased energy 1 3 3   Change in appetite 0 2 0  Feeling bad or failure about yourself  0 3 3  Trouble concentrating 0 3 0  Moving slowly or fidgety/restless 0 3 3  Suicidal thoughts 0 0 0  PHQ-9 Score 4 20 14   Difficult doing work/chores Not difficult at all Somewhat difficult Very difficult    Social History   Tobacco Use  Smoking Status Never   Passive exposure: Past  Smokeless Tobacco Never   BP Readings from Last 3 Encounters:  03/12/22 (!) 120/58  03/03/22 108/87  02/25/22 123/62   Pulse Readings from Last 3 Encounters:  03/12/22 83  03/03/22 90  02/25/22 86   Wt Readings from Last 3 Encounters:  03/12/22 275 lb (124.7 kg)  03/03/22 263 lb 6.1 oz (119.5 kg)  02/25/22 263 lb 6.4 oz (119.5 kg)   BMI Readings from Last 3 Encounters:  03/12/22 45.76 kg/m  03/03/22 43.83 kg/m  02/25/22 43.83 kg/m    Allergies  Allergen Reactions   Almond Oil Anaphylaxis, Shortness Of Breath and Swelling   Morphine And Related Shortness Of Breath and Other (See Comments)    Pt. States while in the hospital it affected her breathing, O2 dropped to the 70's   Atorvastatin Other (See Comments)    Leg weakness   Ceclor [Cefaclor] Nausea And Vomiting   Sulfa Antibiotics Nausea And Vomiting   Ciprofloxacin Other (See Comments)    Makes joints and muscles ache    Levaquin [Levofloxacin] Other (See Comments)    Body aches   Losartan Other (See Comments)    Weakness    Statins Other (See Comments)    Leg and body weakness    Medications Reviewed Today     Reviewed by Joaquim Namuncan, Graham S, MD (Physician) on 03/12/22 at 1458  Med List Status: <None>   Medication Order Taking? Sig Documenting Provider Last Dose Status Informant  acetaminophen (TYLENOL) 500 MG tablet [id number] Yes Take 500 mg by mouth every 6 (six) hours as needed for moderate pain. [provider] Taking Active Self  albuterol (VENTOLIN HFA) 108 (90 Base) MCG/ACT inhaler [id number] Yes INHALE TWO PUFFS BY MOUTH INTO LUNGS every SIX hours AS NEEDED Joaquim Namuncan, Graham S, MD Taking Active   aspirin EC 81 MG tablet [id number] Yes Take 1 tablet (81 mg total) by mouth daily. Swallow whole. Tereso NewcomerWeaver, Scott T, PA-C Taking Active Self  BAYER CONTOUR NEXT TEST test strip [id number] Yes 1 each by Other route daily. As directed [provider] Taking Active Self           Med Note Katrinka Blazing(SMITH, JEFFREY W   Mon May 03, 2017 11:02 PM)    benzonatate (TESSALON) 200 MG capsule [id number] Yes Take 1 capsule (200 mg total) by mouth 3 (three) times daily as needed. Joaquim Namuncan, Graham S, MD Taking Active   budesonide (PULMICORT) 0.5 MG/2ML nebulizer solution [id number] Yes Take 2 mLs (0.5 mg total) by nebulization 2 (two) times daily. Coralyn HellingSood, Vineet, MD Taking Active Self  buPROPion (WELLBUTRIN XL) 300 MG 24 hr tablet [id number] Yes TAKE ONE TABLET BY MOUTH EVERY MORNING Joaquim Namuncan, Graham S, MD Taking Active   cetirizine (ZYRTEC) 10 MG tablet [id number] Yes Take 10 mg by mouth at bedtime. [provider] Taking Active Self  Cholecalciferol (VITAMIN D-3) 5000 UNITS TABS [id number] Yes Take 5,000 Units by mouth every other day.  [provider] Taking Active Self  colchicine 0.6 MG tablet [id number] Yes TAKE 1 TABLET (0.6 MG TOTAL) BY MOUTH DAILY AS NEEDED (FOR GOUT) [name], MD Taking Active    Continuous Blood Gluc Receiver ([employer]ent) Echo [id number] Yes by Does not apply route. [provider] Taking Active Self  Continuous Blood Gluc Sensor (Arthur) [place] [id number] Yes by Does not apply route. Apply sensor every 10 days [provider] Taking Active Self  dicyclomine (BENTYL) 20 MG tablet [id number] Yes TAKE 1 TABLET UP TO FOUR TIMES A DAY FOR GI CRAMPING, PAIN, NAUSEA, AND VOMITING AS NEEDED [name], MD Taking Active Self  Dulaglutide (TRULICITY) 4.5 LK/4.4WN SOPN [id number] Yes INJECT 4.5 MG ONCE WEEKLY [name], MD Taking Active   DULoxetine (CYMBALTA) 60 MG capsule [id number] Yes TAKE 1 CAPSULE BY MOUTH ONCE DAILY. [name], MD Taking Active Self  estradiol (ESTRACE) 0.1 MG/GM vaginal cream [id number] Yes Place 1 Applicatorful vaginally 3 (three) times a week. [provider] Taking Active Self  Evolocumab (REPATHA SURECLICK) 329 MG/ML Darden Palmer [id number] Yes Inject 140 mg into the skin every 14 (fourteen) days. [name], MD Taking Active   fluticasone [facility]) 50 MCG/ACT nasal spray [id number] Yes Place 2 sprays into both nostrils daily. Evlyn Courier, PA-C Taking Active Self  formoterol (PERFOROMIST) 20 MCG/2ML nebulizer solution [id number] Yes Take 2 mLs (20 mcg total) by nebulization 2 (two) times daily. [name], MD Taking Active Self  furosemide (LASIX) 20 MG tablet [id number] Yes Take 3 tablets (60 mg total) by mouth 2 (two) times daily. Richardson Dopp T, PA-C Taking Active   gabapentin (NEURONTIN) 300 MG capsule [id number] Yes TAKE ONE CAPSULE BY MOUTH EVERYDAY AT BEDTIME May take additional capsule daily if needed [name], MD Taking Active   GLOBAL EASE INJECT PEN NEEDLES 32G X 4 MM MISC [id number] Yes USE AS DIRECTED WITH VICTOZA AND TRESIBA. [name], MD Taking Active Self  insulin degludec (TRESIBA FLEXTOUCH) 200 UNIT/ML FlexTouch Pen [id number] Yes Inject 120 Units into the skin daily.  [provider] Taking Active Self  ipratropium-albuterol (DUONEB) 0.5-2.5 (3) MG/3ML SOLN [id number] Yes Take 3 mLs by nebulization every 6 (six) hours as needed (wheezing). [name], MD Taking Active Self  Iron, Ferrous Sulfate, 325 (65 Fe) MG TABS [id number] Yes Take 325 mg by mouth 3 (three) times a week. [name], MD Taking Active   meloxicam [facility]) 15 MG tablet [id number] Yes Take 7.5 mg by mouth daily as needed for pain. [provider] Taking Active Self  metolazone (ZAROXOLYN) 5 MG tablet [id number] Yes TAKE 1 TABLET BY MOUTH EVERY DAY AS NEEDED [name], MD Taking Active   [facility] LANCETS [place] [id number] Yes 1 each by Other route. As directed [provider] Taking Active Self           Med Note Mickie Bail Oct 21, 2015  3:31 PM)    nitrofurantoin (MACRODANTIN) 50 MG capsule [id number] Yes Take 50 mg by mouth daily. [provider] Taking Active Self           Med Note [name], [name]   Fri Nov 07, 2021  2:27 PM)    nystatin (MYCOSTATIN/NYSTOP) powder [id number] Yes Apply 1 application topically 3 (three) times daily. [name], MD  Taking Active Self  ondansetron (ZOFRAN) 4 MG tablet [id number] Yes TAKE ONE TABLET BY MOUTH every EIGHT hours AS NEEDED FOR NAUSEA AND VOMITING Joaquim Nam, MD Taking Active Self  OXYGEN [id number] Yes Inhale 2 L into the lungs continuous. [provider] Taking Active Self  pantoprazole (PROTONIX) 40 MG tablet [id number] Yes TAKE ONE TABLET BY MOUTH EVERY MORNING Joaquim Nam, MD Taking Active   polyethylene glycol [facility] / GLYCOLAX) packet [id number] Yes Take 17 g by mouth daily as needed for mild constipation. [provider] Taking Active Self  potassium chloride (MICRO-K) 10 MEQ CR capsule [id number] Yes Take 5 capsules (50 mEq total) by mouth daily. Joaquim Nam, MD Taking Active   primidone (MYSOLINE) 50 MG tablet [id number] Yes Take 0.25 tablets by mouth  daily. [provider] Taking Active   revefenacin (YUPELRI) 175 MCG/3ML nebulizer solution [id number] Yes Inhale one vial in nebulizer once daily. Do not mix with other nebulized medications. Coralyn Helling, MD Taking Active Self  rOPINIRole (REQUIP) 4 MG tablet [id number] Yes TAKE 1 TABLET BY MOUTH TWICE DAILY Joaquim Nam, MD Taking Active Self  simethicone (GAS-X) 80 MG chewable tablet [id number] Yes Chew 1 tablet (80 mg total) by mouth every 6 (six) hours as needed for flatulence. Joaquim Nam, MD Taking Active   spironolactone (ALDACTONE) 25 MG tablet [id number] Yes TAKE ONE TABLET BY MOUTH ONCE DAILY Joaquim Nam, MD Taking Active   traMADol (ULTRAM) 50 MG tablet [id number] Yes Take 1 tablet (50 mg total) by mouth 3 (three) times daily as needed. Joaquim Nam, MD Taking Active   UNABLE TO FIND [id number] Yes CPAP- At bedtime [provider] Taking Active Self            Patient Active Problem List   Diagnosis Date Noted   Myalgia 02/25/2022   Prolonged Q-T interval on ECG 02/25/2022   Aortic atherosclerosis (HCC) 06/04/2021   Preoperative cardiovascular examination 06/04/2021   Hyperglycemia 02/14/2021   Rash 09/27/2020   COPD exacerbation (HCC) 09/21/2020   Acute exacerbation of COPD with asthma (HCC) 09/19/2020   Mixed diabetic hyperlipidemia associated with type 2 diabetes mellitus (HCC) 09/19/2020   Pain of foot 06/20/2020   Medicare annual wellness visit, initial 06/20/2020   Constipation 06/11/2020   Diverticulosis of colon 06/11/2020   Irritable bowel syndrome 06/11/2020   Periumbilical pain 06/11/2020   RLS (restless legs syndrome)    Asthma exacerbation 01/08/2020   Muscle weakness 01/08/2020   Grade I diastolic dysfunction 01/08/2020   Drug-induced myopathy 10/10/2019   Advance care planning 06/14/2019   RLQ abdominal pain 06/14/2019   COPD (chronic obstructive pulmonary disease) (HCC) 06/14/2019   Leukocytosis 06/14/2019    Hypercalcemia 06/14/2019   Gout 06/14/2019   Type 2 diabetes mellitus with hyperglycemia, without long-term current use of insulin (HCC)    COPD with acute exacerbation (HCC) 05/03/2017   Diabetes mellitus without complication (HCC)    CKD (chronic kidney disease) stage 3, GFR 30-59 ml/min (HCC)    Hypersomnia with sleep apnea 09/16/2015   Fatigue 06/07/2015   Morbid obesity (HCC) 06/07/2015   (HFpEF) heart failure with preserved ejection fraction (HCC)    Pneumonia of both lower lobes due to infectious organism 02/09/2013   OSA on CPAP 08/23/2012   Essential hypertension    Depression    GERD without esophagitis    Hyperlipidemia    Ulcerative colitis (HCC)    Patellar tendinitis 05/25/2011   Knee pain 05/25/2011  Myofascial pain 05/25/2011   Cervicalgia 05/25/2011    Immunization History  Administered Date(s) Administered   Fluad Quad(high Dose 65+) 12/31/2021   Influenza Split 12/01/2012, 01/18/2017   Influenza Whole 12/25/2011   Influenza,inj,Quad PF,6+ Mos 11/27/2013, 12/08/2014, 12/11/2015   Influenza-Unspecified 12/07/2017   Janssen (J&J) SARS-COV-2 Vaccination 06/15/2019   Pneumococcal Conjugate-13 02/17/2017   Pneumococcal Polysaccharide-23 11/27/2013   Tdap 04/01/2010   Zoster Recombinat (Shingrix) 10/06/2021, 12/31/2021     Compliance/Adherence/Medication fill history: Care Gaps: DEXA (never done) Foot exam (11/28/21) Eye exam (12/21/21) UACR (never done)  Star-Rating Drugs: Trulicity - PDC 94%  SDOH:  (Social Determinants of Health) assessments and interventions performed: No SDOH Interventions    Flowsheet Row Clinical Support from 07/10/2021 in [facility] Chronic Care Management from 09/02/2020 in [facility] Chronic Care Management from 08/12/2020 in [facility] Chronic Care Management from 07/29/2020 in [facility] PULMONARY REHAB OTHER RESPIRATORY from 12/28/2017  in [facility] P[facility] REHAB COPD ORIENTATION from 03/29/2017 in [facility]  SDOH Interventions        Food Insecurity Interventions Intervention Not Indicated -- Intervention Not Indicated -- -- --  Housing Interventions Intervention Not Indicated -- -- -- -- --  Transportation Interventions Intervention Not Indicated -- Intervention Not Indicated -- -- --  Depression Interventions/Treatment  -- -- Medication -- Counseling, Medication, Currently on Treatment Currently on Treatment  [is on cymbalta, has seen a psychologist in past]  Financial Strain Interventions Intervention Not Indicated Intervention Not Indicated -- Intervention Not Indicated -- --  Physical Activity Interventions Intervention Not Indicated -- Other (Comments)  [Discussed  chair exercises.  Exercise booklet mailed to patient.] -- -- --  Stress Interventions Intervention Not Indicated -- -- -- -- --  Social Connections Interventions Intervention Not Indicated -- -- -- -- --      SDOH Screenings   Food Insecurity: No Food Insecurity (07/10/2021)  Housing: Low Risk  (07/10/2021)  Transportation Needs: No Transportation Needs (07/10/2021)  Alcohol Screen: Low Risk  (07/10/2021)  Depression (PHQ2-9): Low Risk  (07/10/2021)  Financial Resource Strain: Low Risk  (07/10/2021)  Physical Activity: Inactive (07/10/2021)  Social Connections: Moderately Isolated (07/10/2021)  Stress: No Stress Concern Present (07/10/2021)  Tobacco Use: Low Risk  (03/12/2022)    Medication Assistance: None required.  Patient affirms current coverage meets needs.  Medication Access: Within the past 30 days, how often has patient missed a dose of medication? 0 Is a pillbox or other method used to improve adherence? Yes  Factors that may affect medication adherence? no barriers identified Are meds synced by current pharmacy? Yes  Are meds delivered  by current pharmacy? Yes  Does patient experience delays in picking up medications due to transportation concerns? No   [facility] Reviewed: Is patient disadvantaged to use [facility]?: No  Current Rx insurance plan: [employer] Name and location of Current pharmacy:  [facility] [phone] [facility], [place] - [address] [place] [address] Phone: [phone] Fax: [phone]  [facility] - [place], [place] - [address] [place] [address] Phone: [phone] Fax: [phone]  [facility] services reviewed with patient today?: Yes  30 day pill packs: Delivered 03/09/22 Bupropion HCL XL 300mg - take 1 tablet at evening meal Cholecalciferol (vit D3)-5000units- take 1 tablet breakfast every  other day  Ropinirole 4mg - take 1 tablet breakfast 1 tablet bedtime  Pantoprazole 40mg - take 1 tablet bedtime Spironolactone 25mg - take 1 tablet breakfast Furosemide 20mg  take 3 tablets breakfast and dinner Duloxetine 60mg - take 1 tablet breakfast Ferrous Sulfate 325mg - take 1 tablet evening meal every other day  Gabapentin 300mg -take 1 tablet bedtime Cetirizine 10mg - take 1 tablet daily as needed Aspirin 81mg  -take 1 tablet at breakfast Fenofibrate 145 mg-take 1 tablet breakfast   VIAL medications: Albuterol  108 mcg/act inhaler- 2 puffs into lungs every 6 hours as needed Gabapentin 300mg -take extra tablet as needed at bedtime   Patient declined the following medications this month: Potassium chloride 10 meq ER- supply o hand due to decrease in dose    Assessment/Plan   Hyperlipidemia: (LDL goal < 70) -Improved - LDL 96 (02/2022), improved from 208 (06/2021) with Praluent; TRIG 297 improved from > 500 -Pt was recently switched from Praluent to Repatha due to insurance coverage, she has not started Repatha yet, planning to start with next injection -Pt has been holding fenofibrate per  instructions from cardiology  - she reports improvement in runny nose; discussed this was stopped due to concern for myalgias not sinus issues, but pt reports no change in myalgias -High ASCVD risk (25%) - reduced to 16% with improvement in cholesterol -Current treatment: Repatha 150 mg q 14 days - Appropriate, Query effective Fenofibrate 145 mg - on hold -Medications previously tried: Multiple statins (atorvastatin, others) - leg weakness; Vascepa (cost) -Educated on Cholesterol goals; benefits of Repatha;  -Recommend to continue current medication  Diabetes (A1c goal <7%) -Improved - A1c 7.6% (11/2021) improved from 9.0%; pt endorses improvement in BG on higher dose of insulin and Trulicity.  -Recently pt had glucose of 509 on BMP, but pt had not been taking insulin for a few days prior to that, "no good reason" for stopping insulin she just didn't take it for a few days; she reports now she is compliant with insulin -Followed with Dr Everardo AllEllison (established care 05/2021, she has not re-established with new provider yet, appt 03/31/22 -Dx 2017, insulin since 2020. Hx of chronic UTIs, would avoid SGLT2-inhibitor -Current home glucose readings: via Dexcom G7; glucose 281 right now, has not been beeping as much last several days -Current medications: Trulicity 4.5 mg weekly (Thurs) - Appropriate, Query Effective Tresiba 120 units daily (midday) -Appropriate, Query Effective Dexcom G7 w/ reader - Appropriate, Effective, Safe, Accessible -Medications previously tried: Jardiance, Victoza, metformin -Current meal patterns: The patient reports she is on a salt free, sugar free, gluten free diet but had a hard time sticking to this. She tries to limit portions.  -Discussed importance of compliance with insulin; she may be a candidate for Mounjaro -Recommend to continue current medication  Heart Failure (Goal: manage symptoms and prevent exacerbations) -Controlled - per pt report of symptoms. She has not  been checking her weight regularly; pt has previously had trouble swallowing large tablets but does well with Potassium 10 mEq capsules.  -Followed by Dr. Excell Seltzerooper -Last ejection fraction: 70% (Date: 03/21) -HF type: HFpEF (EF > 50%) -NYHA Class: III (marked limitation of activity) -Current treatment: Spironolactone 25 mg daily AM - Appropriate, Effective, Safe, Accessible Furosemide 20 mg - 3 tab BID- Appropriate, Effective, Safe, Accessible Potassium 10 mEq - 5 tab daily- Appropriate, Effective, Safe, Accessible Metolazone 2.5 mg PRN wt gain - Appropriate, Effective, Safe, Accessible -Medications previously tried: torsemide, bumetanide, losartan  -Recommended to continue current medication;  COPD with asthma, OSA (Goal:  control symptoms and prevent exacerbations) -Pt currently being treated for COPD exacerbation/COVID -she wears Oxygen at night and PRN during the day;  -Followed by Dr Halford Chessman, [facility]. Pt does not follow up with pulmonology as frequently as recommended.  -Pulmonary function testing: 2021 FEV1 42% predicted -Gold Grade: Gold 3 (FEV1 30-49%) -Current COPD Classification:  E (high sx, >2 exacerbations/yr) -Exacerbations requiring treatment in last 6 months: yes, multiple -Compliant with CPAP nightly -Current regimen: Budesonide nebuilzer BID - Appropriate, Effective, Safe, Accessible Yupelri (revefenacin) nebulizer daily -Appropriate, Effective, Safe, Accessible Albuterol/ipratropium nebulizer - takes 2-3 times per day as needed - Appropriate, Effective, Safe, Accessible Albuterol inhaler - 2 puffs every 6 hours PRN - Appropriate, Effective, Safe, Accessible -Medications previously tried: Performist (cost), Advair, Incruse, Trelegy (pt does not like powder inhalers) -Recommended to continue current medication; follow up with pulmonology more regularly  Depression/Anxiety/Pain(Goal: Control symptoms) -Controlled, stable per patient -Current treatment: Bupropion  300 mg daily - Appropriate, Effective, Safe, Accessible Duloxetine 60 mg daily - Appropriate, Effective, Safe, Accessible Gabapentin 100 mg - 3 caps HS - Appropriate, Effective, Safe, Accessible -Medications previously tried/failed: none -Concern that ropinirole may be causing her to fall asleep suddenly during the day. This has been discussed with patient before and per chart records, she was unable to tolerate dose reductions. Most recently attempted taper 11/2021 per neurology instructions -Recommend to continue current medication  RLS (Goal: manage symptoms) -Not ideally controlled - pt reports she stopped primidone after a few weeks due to side effects (drowsiness, brain fog), although it did help with pain/tremor -Pt was advised to taper ropinirole to lower dose in 11/2021 per neurlogy (current dose is Parkinson's dose, RLS dose is lower), but she has not started this taper; discussed reason for dose reduction and symptoms may get worse before they get better -Current treatment  Ropinirole 4 mg BID (RLS) - Appropriate, Effective, Query Safe Gabapentin 100 mg - 3 caps HS - Appropriate, Effective, Safe, Accessible -Medications previously tried: primidone -Recommended to continue current medication; advised to f/u with neurology   [name], PharmD, BCACP Clinical Pharmacist [facility] [phone]

## 2022-03-13 NOTE — Addendum Note (Signed)
Addended by: [name] on: 03/13/2022 05:57 PM   Modules accepted: Orders

## 2022-03-13 NOTE — Telephone Encounter (Signed)
Received call from Opal Sidles at [facility] radiology. For Call report. I have printed and will give to Dr. [name] to make him aware. Also sending this message for him to advise recommendation for patient.

## 2022-03-14 ENCOUNTER — Other Ambulatory Visit: Payer: Self-pay | Admitting: Family Medicine

## 2022-03-15 NOTE — Telephone Encounter (Signed)
Please make sure patient is set up for a follow-up chest x-ray here in clinic in about 1 month.  Please make sure her CT is arranged in the meantime.  I put in the orders for both.  The CT needs to be done in the next few days.  The chest x-ray needs to be done in about 1 month.  I previously called the patient about getting this set up.  Thanks.

## 2022-03-15 NOTE — Assessment & Plan Note (Signed)
At this point okay for outpatient follow-up.  Start Augmentin.  Check chest film.  See notes on imaging.  See following phone note.  Continue baseline nebulizer/albuterol as needed

## 2022-03-15 NOTE — Addendum Note (Signed)
Addended by: [name] on: 03/15/2022 03:11 PM   Modules accepted: Orders

## 2022-03-16 NOTE — Telephone Encounter (Signed)
Patient scheduled for CT 03/20/22. Patient scheduled for follow up Chest x-ray 04/16/22

## 2022-03-16 NOTE — Patient Instructions (Signed)
Visit Information  Phone number for Pharmacist: [phone]  Thank you for meeting with me to discuss your medications! Below is a summary of what we talked about during the visit:   Recommendations/Changes made from today's visit: -Reviewed importance of compliance with insulin  Follow up plan: -[facility] Concierge will call patient 1 month for medication coordination -Pharmacist follow up televisit scheduled for 3 months -Endocrine appt 03/31/22 (TOC - Dr Kelton Pillar)   [name], PharmD, [place] Clinical Pharmacist [facility] [phone]

## 2022-03-20 ENCOUNTER — Ambulatory Visit
Admission: RE | Admit: 2022-03-20 | Discharge: 2022-03-20 | Disposition: A | Discharge status: Home / Self Care | Payer: Medicare HMO | Source: Ambulatory Visit | Attending: Family Medicine | Admitting: Family Medicine

## 2022-03-20 DIAGNOSIS — I700 Atherosclerosis of aorta: Secondary | ICD-10-CM | POA: Diagnosis not present

## 2022-03-20 DIAGNOSIS — J9811 Atelectasis: Secondary | ICD-10-CM | POA: Diagnosis not present

## 2022-03-20 DIAGNOSIS — R911 Solitary pulmonary nodule: Secondary | ICD-10-CM

## 2022-03-21 DIAGNOSIS — J449 Chronic obstructive pulmonary disease, unspecified: Secondary | ICD-10-CM | POA: Diagnosis not present

## 2022-03-26 ENCOUNTER — Other Ambulatory Visit: Payer: Self-pay | Admitting: Family Medicine

## 2022-03-26 DIAGNOSIS — E1142 Type 2 diabetes mellitus with diabetic polyneuropathy: Secondary | ICD-10-CM | POA: Diagnosis not present

## 2022-03-26 DIAGNOSIS — G2581 Restless legs syndrome: Secondary | ICD-10-CM | POA: Diagnosis not present

## 2022-03-26 DIAGNOSIS — J309 Allergic rhinitis, unspecified: Secondary | ICD-10-CM | POA: Diagnosis not present

## 2022-03-26 DIAGNOSIS — G250 Essential tremor: Secondary | ICD-10-CM | POA: Diagnosis not present

## 2022-03-26 DIAGNOSIS — G4733 Obstructive sleep apnea (adult) (pediatric): Secondary | ICD-10-CM | POA: Diagnosis not present

## 2022-03-26 DIAGNOSIS — K589 Irritable bowel syndrome without diarrhea: Secondary | ICD-10-CM | POA: Diagnosis not present

## 2022-03-26 DIAGNOSIS — R69 Illness, unspecified: Secondary | ICD-10-CM | POA: Diagnosis not present

## 2022-03-26 DIAGNOSIS — E785 Hyperlipidemia, unspecified: Secondary | ICD-10-CM | POA: Diagnosis not present

## 2022-03-26 DIAGNOSIS — Z008 Encounter for other general examination: Secondary | ICD-10-CM | POA: Diagnosis not present

## 2022-03-26 DIAGNOSIS — R269 Unspecified abnormalities of gait and mobility: Secondary | ICD-10-CM | POA: Diagnosis not present

## 2022-03-26 DIAGNOSIS — K219 Gastro-esophageal reflux disease without esophagitis: Secondary | ICD-10-CM | POA: Diagnosis not present

## 2022-03-26 DIAGNOSIS — M1990 Unspecified osteoarthritis, unspecified site: Secondary | ICD-10-CM | POA: Diagnosis not present

## 2022-03-27 ENCOUNTER — Telehealth: Payer: Self-pay

## 2022-03-27 NOTE — Progress Notes (Addendum)
Care Management & Coordination Services Pharmacy Team  Reason for Encounter: Medication coordination and delivery  Contacted patient on 03/27/2022 to discuss medications   Recent office visits:  03/12/22-Graham Duncan,MD(PCP)-f/u cough,labs, xray,start augmentin 875-125mg  take 1 tablet twice daily  , use nebulizer.  Recent consult visits:  None since last CCM contact  Hospital visits:  None in previous 6 months  Medications: Outpatient Encounter Medications as of 03/27/2022  Medication Sig   acetaminophen (TYLENOL) 500 MG tablet Take 500 mg by mouth every 6 (six) hours as needed for moderate pain.   albuterol (VENTOLIN HFA) 108 (90 Base) MCG/ACT inhaler INHALE TWO PUFFS BY MOUTH INTO LUNGS every SIX hours AS NEEDED   amoxicillin-clavulanate (AUGMENTIN) 875-125 MG tablet Take 1 tablet by mouth 2 (two) times daily.   aspirin EC 81 MG tablet Take 1 tablet (81 mg total) by mouth daily. Swallow whole.   BAYER CONTOUR NEXT TEST test strip 1 each by Other route daily. As directed   benzonatate (TESSALON) 200 MG capsule Take 1 capsule (200 mg total) by mouth 3 (three) times daily as needed.   budesonide (PULMICORT) 0.5 MG/2ML nebulizer solution Take 2 mLs (0.5 mg total) by nebulization 2 (two) times daily.   buPROPion (WELLBUTRIN XL) 300 MG 24 hr tablet TAKE ONE TABLET BY MOUTH EVERY MORNING   cetirizine (ZYRTEC) 10 MG tablet Take 10 mg by mouth at bedtime.   Cholecalciferol (VITAMIN D-3) 5000 UNITS TABS Take 5,000 Units by mouth every other day.    colchicine 0.6 MG tablet TAKE 1 TABLET (0.6 MG TOTAL) BY MOUTH DAILY AS NEEDED (FOR GOUT)   Continuous Blood Gluc Receiver ([place]) DEVI by Does not apply route.   Continuous Blood Gluc Sensor (DEXCOM G7 SENSOR) MISC by Does not apply route. Apply sensor every 10 days   dicyclomine (BENTYL) 20 MG tablet TAKE 1 TABLET UP TO FOUR TIMES A DAY FOR GI CRAMPING, PAIN, NAUSEA, AND VOMITING AS NEEDED   doxycycline (VIBRA-TABS) 100 MG tablet Take  1 tablet (100 mg total) by mouth 2 (two) times daily.   Dulaglutide (TRULICITY) 4.5 ZO/1.0RU SOPN INJECT 4.5 MG ONCE WEEKLY   DULoxetine (CYMBALTA) 60 MG capsule TAKE 1 CAPSULE BY MOUTH ONCE DAILY.   estradiol (ESTRACE) 0.1 MG/GM vaginal cream Place 1 Applicatorful vaginally 3 (three) times a week.   Evolocumab (REPATHA SURECLICK) 045 MG/ML SOAJ Inject 140 mg into the skin every 14 (fourteen) days.   fluticasone (FLONASE) 50 MCG/ACT nasal spray Place 2 sprays into both nostrils daily.   formoterol (PERFOROMIST) 20 MCG/2ML nebulizer solution Take 2 mLs (20 mcg total) by nebulization 2 (two) times daily.   furosemide (LASIX) 20 MG tablet Take 3 tablets (60 mg total) by mouth 2 (two) times daily.   gabapentin (NEURONTIN) 300 MG capsule TAKE ONE CAPSULE BY MOUTH EVERYDAY AT BEDTIME May take additional capsule daily if needed   GLOBAL EASE INJECT PEN NEEDLES 32G X 4 MM MISC USE AS DIRECTED WITH VICTOZA AND TRESIBA.   insulin degludec (TRESIBA FLEXTOUCH) 200 UNIT/ML FlexTouch Pen Inject 120 Units into the skin daily.   ipratropium-albuterol (DUONEB) 0.5-2.5 (3) MG/3ML SOLN Take 3 mLs by nebulization every 6 (six) hours as needed (wheezing).   Iron, Ferrous Sulfate, 325 (65 Fe) MG TABS Take 325 mg by mouth 3 (three) times a week.   meloxicam (MOBIC) 15 MG tablet Take 7.5 mg by mouth daily as needed for pain.   metolazone (ZAROXOLYN) 5 MG tablet TAKE 1 TABLET BY MOUTH EVERY DAY AS  NEEDED   MICROLET LANCETS MISC 1 each by Other route. As directed   nitrofurantoin (MACRODANTIN) 50 MG capsule Take 50 mg by mouth daily.   nystatin (MYCOSTATIN/NYSTOP) powder Apply 1 application topically 3 (three) times daily.   ondansetron (ZOFRAN) 4 MG tablet TAKE ONE TABLET BY MOUTH every EIGHT hours AS NEEDED FOR NAUSEA AND VOMITING   OXYGEN Inhale 2 L into the lungs continuous.   pantoprazole (PROTONIX) 40 MG tablet TAKE ONE TABLET BY MOUTH EVERY MORNING   polyethylene glycol (MIRALAX / GLYCOLAX) packet Take 17 g by  mouth daily as needed for mild constipation.   potassium chloride (MICRO-K) 10 MEQ CR capsule Take 5 capsules (50 mEq total) by mouth daily.   primidone (MYSOLINE) 50 MG tablet Take 0.25 tablets by mouth daily.   revefenacin (YUPELRI) 175 MCG/3ML nebulizer solution Inhale one vial in nebulizer once daily. Do not mix with other nebulized medications.   rOPINIRole (REQUIP) 4 MG tablet TAKE 1 TABLET BY MOUTH TWICE DAILY   simethicone (GAS-X) 80 MG chewable tablet Chew 1 tablet (80 mg total) by mouth every 6 (six) hours as needed for flatulence.   spironolactone (ALDACTONE) 25 MG tablet TAKE ONE TABLET BY MOUTH ONCE DAILY   traMADol (ULTRAM) 50 MG tablet Take 1 tablet (50 mg total) by mouth 3 (three) times daily as needed.   UNABLE TO FIND CPAP- At bedtime   No facility-administered encounter medications on file as of 03/27/2022.   BP Readings from Last 3 Encounters:  03/12/22 (!) 120/58  03/03/22 108/87  02/25/22 123/62    Pulse Readings from Last 3 Encounters:  03/12/22 83  03/03/22 90  02/25/22 86    Lab Results  Component Value Date/Time   HGBA1C 7.6 (H) 11/07/2021 03:47 PM   HGBA1C 9.0 (A) 05/14/2021 03:10 PM   HGBA1C 10.9 (H) 02/14/2021 06:59 AM   Lab Results  Component Value Date   CREATININE 1.61 (H) 02/25/2022   BUN 27 02/25/2022   GFR 48.76 (L) 02/27/2021   GFRNONAA 48 (L) 03/31/2021   GFRAA 69 01/19/2020   NA 136 02/25/2022   K 4.2 02/25/2022   CALCIUM 10.1 02/25/2022   CO2 27 02/25/2022     Last adherence delivery date:03/09/22      Patient is due for next adherence delivery on: 04/08/22  Spoke with patient on 03/30/22  reviewed medications and coordinated delivery.  This delivery to include: Adherence Packaging  30 Days  Bupropion HCL XL 300mg - take 1 tablet at evening meal Cholecalciferol (vit D3)-5000units- take 1 tablet breakfast every other day  Ropinirole 4mg - take 1 tablet breakfast 1 tablet bedtime  Pantoprazole 40mg - take 1 tablet  bedtime Spironolactone 25mg - take 1 tablet breakfast Furosemide 20mg  take 3 tablets breakfast and dinner Duloxetine 60mg - take 1 tablet breakfast Ferrous Sulfate 325mg - take 1 tablet evening meal every other day  Gabapentin 300mg -take 1 tablet bedtime Cetirizine 10mg - take 1 tablet daily as needed Aspirin 81mg  -take 1 tablet at breakfast    VIAL medications: Patient does not need this delivery  Albuterol  108 mcg/act inhaler- 2 puffs into lungs every 6 hours as needed Gabapentin 300mg -take extra tablet as needed at bedtime   Refills requested from providers include: albuterol- patient does not need at this time due to supply on hand   Confirmed delivery date of 04/08/2022, advised patient that pharmacy will contact them the morning of delivery.   Any concerns about your medications? Yes- patient has a supply on hand of gabapentin in a vial  for extra, and albuterol.  How often do you forget or accidentally miss a dose? Rarely  Do you use a pillbox? No  Is patient in packaging Yes  Any concerns or issues with your packaging? Satisfied    Recent blood pressure readings are as follows: none available  03/30/22  weight 258lbs  Recent blood glucose readings are as follows:none available    Cycle dispensing form sent to [facility],   for review.   [name], CMA

## 2022-03-30 NOTE — Progress Notes (Signed)
Name: Kathleen Hatfield  Age/ Sex: 67 y.o., female   MRN/ DOB: [id number], 21-Nov-1955     PCP: [name], MD   Reason for Endocrinology Evaluation: Type 2 Diabetes Mellitus  Initial Endocrine Consultative Visit: 05/14/2021    PATIENT IDENTIFIER: Kathleen Hatfield is a 67 y.o. female with a past medical history of DM, CHF, COPS. OSA on CPAP, GERD. The patient has followed with Endocrinology clinic since 05/14/2021 for consultative assistance with management of her diabetes.  DIABETIC HISTORY:  Kathleen Hatfield was diagnosed with DM 2017, and started insulin therapyin 2000. Her hemoglobin A1c has ranged from 7.4% in 2017, peaking at 11.5% in 2022.   She was seen by Dr. [name] once 05/2021   SUBJECTIVE:   During the last visit (05/14/2021): saw Dr. [name]   Today (03/31/2022): Kathleen Hatfield is here for a follow up on diabetes management. She is accompanied by her husband.  She was checking glucose through dexcom G7  She has chronic UTIs- sees urology on chronic Abx  Takes Tyler Aas every other day  Denies vomiting or diarrhea  Has been having nausea that she attributes to doxycyline and Augmentin    HOME DIABETES REGIMEN:  Trulicity 4.5 mg weekly  Tresiba 120 units     Statin: on repatha ACE-I/ARB: no    METER DOWNLOAD SUMMARY: n/a   DIABETIC COMPLICATIONS: Microvascular complications:  Neuropathy,  Denies:  Last Eye Exam: Completed 12/2019  Macrovascular complications:  CAD Denies:  CVA, PVD   HISTORY:  Past Medical History:  Past Medical History:  Diagnosis Date   Anemia    Arthritis    Asthma    Chronic diastolic CHF 38/1829   Echocardiogram 05/2019: EF 70, no RWMA, mild LVH, Gr 1 DD, normal RVSF, severe LVH, borderline asc Aorta (39 mm)   CKD (chronic kidney disease)    Depression    Diabetes mellitus without complication (HCC)    Diverticulitis    Dyspnea    with exertion   GERD (gastroesophageal reflux disease)    History of blood transfusion    Hyperlipidemia     cannot tolerate statins   Hypertension    patient states she has never had high blood pressure.    Nuclear stress test    Myoview 05/2019: EF 83, no ischemia or infarction; Low Risk   Peripheral neuropathy    Pneumonia    PONV (postoperative nausea and vomiting)    RLS (restless legs syndrome)    Sleep apnea    uses CPAP   Ulcerative colitis ([place])    dr Collene Mares   Past Surgical History:  Past Surgical History:  Procedure Laterality Date   ABDOMINAL HYSTERECTOMY     APPENDECTOMY     BIOPSY  10/10/2021   Procedure: BIOPSY;  Surgeon: Carol Ada, MD;  Location: [facility];  Service: Gastroenterology;;   BREAST BIOPSY Left    BREAST SURGERY     left biopsy   CARDIAC CATHETERIZATION     CESAREAN SECTION     CHOLECYSTECTOMY     COLONOSCOPY WITH PROPOFOL N/A 10/10/2021   Procedure: COLONOSCOPY WITH PROPOFOL;  Surgeon: Carol Ada, MD;  Location: [facility];  Service: Gastroenterology;  Laterality: N/A;   HERNIA REPAIR     INSERTION OF MESH N/A 11/15/2015   Procedure: INSERTION OF MESH;  Surgeon: [name], MD;  Location: [facility];  Service: General;  Laterality: N/A;   LAPAROSCOPIC LYSIS OF ADHESIONS N/A 11/15/2015   Procedure: LAPAROSCOPIC LYSIS OF ADHESIONS;  Surgeon: Johney Maine,  Viviann Spare, MD;  Location: [facility];  Service: General;  Laterality: N/A;   RIGHT HEART CATHETERIZATION N/A 02/27/2013   Procedure: RIGHT HEART CATH;  Surgeon: Micheline Chapman, MD;  Location: [facility] CATH LAB;  Service: Cardiovascular;  Laterality: N/A;   RIGHT HEART CATHETERIZATION N/A 12/14/2013   Procedure: RIGHT HEART CATH;  Surgeon: Laurey Morale, MD;  Location: [facility] CATH LAB;  Service: Cardiovascular;  Laterality: N/A;   SIGMOIDECTOMY  2010   diverticular disease   VENTRAL HERNIA REPAIR N/A 11/15/2015   Procedure: LAPAROSCOPIC VENTRAL WALL HERNIA REPAIR;  Surgeon: Karie Soda, MD;  Location: [facility];  Service: General;  Laterality: N/A;   Social History:  reports that she has never smoked. She has been  exposed to tobacco smoke. She has never used smokeless tobacco. She reports that she does not drink alcohol and does not use drugs. Family History:  Family History  Problem Relation Age of Onset   Heart disease Mother    Hypertension Mother    Dementia Mother    Heart disease Father    COPD Father    Hypertension Father    Heart disease Brother    Other Brother        knee replacement; hip replacement   Other Brother        cant brathe when laying down   Diabetes Maternal Grandmother    Breast cancer Neg Hx    Colon cancer Neg Hx      HOME MEDICATIONS: Allergies as of 03/31/2022       Reactions   Almond Oil Anaphylaxis, Shortness Of Breath, Swelling   Morphine And Related Shortness Of Breath, Other (See Comments)   Pt. States while in the hospital it affected her breathing, O2 dropped to the 70's   Atorvastatin Other (See Comments)   Leg weakness   Ceclor [cefaclor] Nausea And Vomiting   Sulfa Antibiotics Nausea And Vomiting   Ciprofloxacin Other (See Comments)   Makes joints and muscles ache   Levaquin [levofloxacin] Other (See Comments)   Body aches   Losartan Other (See Comments)   Weakness   Statins Other (See Comments)   Leg and body weakness        Medication List        Accurate as of March 31, 2022  9:35 AM. If you have any questions, ask your nurse or doctor.          acetaminophen 500 MG tablet Commonly known as: TYLENOL Take 500 mg by mouth every 6 (six) hours as needed for moderate pain.   albuterol 108 (90 Base) MCG/ACT inhaler Commonly known as: VENTOLIN HFA INHALE TWO PUFFS BY MOUTH INTO LUNGS every SIX hours AS NEEDED   amoxicillin-clavulanate 875-125 MG tablet Commonly known as: AUGMENTIN Take 1 tablet by mouth 2 (two) times daily.   aspirin EC 81 MG tablet Take 1 tablet (81 mg total) by mouth daily. Swallow whole.   Bayer Contour Next Test test strip Generic drug: glucose blood 1 each by Other route daily. As directed    benzonatate 200 MG capsule Commonly known as: TESSALON Take 1 capsule (200 mg total) by mouth 3 (three) times daily as needed.   budesonide 0.5 MG/2ML nebulizer solution Commonly known as: PULMICORT Take 2 mLs (0.5 mg total) by nebulization 2 (two) times daily.   buPROPion 300 MG 24 hr tablet Commonly known as: WELLBUTRIN XL TAKE ONE TABLET BY MOUTH EVERY MORNING   cetirizine 10 MG tablet Commonly known as: ZYRTEC Take 10  mg by mouth at bedtime.   colchicine 0.6 MG tablet TAKE 1 TABLET (0.6 MG TOTAL) BY MOUTH DAILY AS NEEDED (FOR GOUT)   Dexcom G7 Receiver Devi by Does not apply route.   Dexcom G7 Sensor Misc 1 Device by Does not apply route as directed. Apply sensor every 10 days What changed:  how much to take when to take this Changed by: Scarlette Shorts, MD   dicyclomine 20 MG tablet Commonly known as: BENTYL TAKE 1 TABLET UP TO FOUR TIMES A DAY FOR GI CRAMPING, PAIN, NAUSEA, AND VOMITING AS NEEDED   doxycycline 100 MG tablet Commonly known as: VIBRA-TABS Take 1 tablet (100 mg total) by mouth 2 (two) times daily.   DULoxetine 60 MG capsule Commonly known as: CYMBALTA TAKE 1 CAPSULE BY MOUTH ONCE DAILY.   estradiol 0.1 MG/GM vaginal cream Commonly known as: ESTRACE Place 1 Applicatorful vaginally 3 (three) times a week.   fluticasone 50 MCG/ACT nasal spray Commonly known as: FLONASE Place 2 sprays into both nostrils daily.   formoterol 20 MCG/2ML nebulizer solution Commonly known as: Perforomist Take 2 mLs (20 mcg total) by nebulization 2 (two) times daily.   furosemide 20 MG tablet Commonly known as: LASIX Take 3 tablets (60 mg total) by mouth 2 (two) times daily.   gabapentin 300 MG capsule Commonly known as: NEURONTIN TAKE ONE CAPSULE BY MOUTH EVERYDAY AT BEDTIME May take additional capsule daily if needed   Global Ease Inject Pen Needles 32G X 4 MM Misc Generic drug: Insulin Pen Needle 1 Device by Other route in the morning, at noon, in the  evening, and at bedtime. What changed: See the new instructions. Changed by: Scarlette Shorts, MD   ipratropium-albuterol 0.5-2.5 (3) MG/3ML Soln Commonly known as: DUONEB Take 3 mLs by nebulization every 6 (six) hours as needed (wheezing).   Iron (Ferrous Sulfate) 325 (65 Fe) MG Tabs Take 325 mg by mouth 3 (three) times a week.   meloxicam 15 MG tablet Commonly known as: MOBIC Take 7.5 mg by mouth daily as needed for pain.   metolazone 5 MG tablet Commonly known as: ZAROXOLYN TAKE 1 TABLET BY MOUTH EVERY DAY AS NEEDED   Microlet Lancets Misc 1 each by Other route. As directed   nitrofurantoin 50 MG capsule Commonly known as: MACRODANTIN Take 50 mg by mouth daily.   NovoLOG FlexPen 100 UNIT/ML FlexPen Generic drug: insulin aspart Max daily 45 units Started by: Scarlette Shorts, MD   nystatin powder Commonly known as: MYCOSTATIN/NYSTOP Apply 1 application topically 3 (three) times daily.   ondansetron 4 MG tablet Commonly known as: ZOFRAN TAKE ONE TABLET BY MOUTH every EIGHT hours AS NEEDED FOR NAUSEA AND VOMITING   OXYGEN Inhale 2 L into the lungs continuous.   pantoprazole 40 MG tablet Commonly known as: PROTONIX TAKE ONE TABLET BY MOUTH EVERY MORNING   polyethylene glycol 17 g packet Commonly known as: MIRALAX / GLYCOLAX Take 17 g by mouth daily as needed for mild constipation.   potassium chloride 10 MEQ CR capsule Commonly known as: MICRO-K Take 5 capsules (50 mEq total) by mouth daily.   primidone 50 MG tablet Commonly known as: MYSOLINE Take 0.25 tablets by mouth daily.   Repatha SureClick 140 MG/ML Soaj Generic drug: Evolocumab Inject 140 mg into the skin every 14 (fourteen) days.   rOPINIRole 4 MG tablet Commonly known as: REQUIP TAKE 1 TABLET BY MOUTH TWICE DAILY   simethicone 80 MG chewable tablet Commonly known as: Gas-X  Chew 1 tablet (80 mg total) by mouth every 6 (six) hours as needed for flatulence.   spironolactone 25 MG  tablet Commonly known as: ALDACTONE TAKE ONE TABLET BY MOUTH ONCE DAILY   traMADol 50 MG tablet Commonly known as: ULTRAM Take 1 tablet (50 mg total) by mouth 3 (three) times daily as needed.   Evaristo Bury FlexTouch 200 UNIT/ML FlexTouch Pen Generic drug: insulin degludec Inject 70 Units into the skin daily. What changed: how much to take Changed by: Scarlette Shorts, MD   Trulicity 4.5 MG/0.5ML Sopn Generic drug: Dulaglutide 4.5 mg by Other route once a week. What changed: See the new instructions. Changed by: Scarlette Shorts, MD   UNABLE TO FIND CPAP- At bedtime   Vitamin D-3 125 MCG (5000 UT) Tabs Take 5,000 Units by mouth every other day.   Yupelri 175 MCG/3ML nebulizer solution Generic drug: revefenacin Inhale one vial in nebulizer once daily. Do not mix with other nebulized medications.         OBJECTIVE:   Vital Signs: BP (!) 148/76 (BP Location: Left Arm, Patient Position: Sitting, Cuff Size: Normal)   Pulse (!) 103   Ht 5\' 5"  (1.651 m)   Wt 268 lb 6.4 oz (121.7 kg)   SpO2 98%   BMI 44.66 kg/m   Wt Readings from Last 3 Encounters:  03/31/22 268 lb 6.4 oz (121.7 kg)  03/12/22 275 lb (124.7 kg)  03/03/22 263 lb 6.1 oz (119.5 kg)     Exam: General: Pt appears well and is in NAD In a wheel chair   Neck: General: Supple without adenopathy. Thyroid: Thyroid size normal.  No goiter or nodules appreciated.   Lungs: Clear with good BS bilat   Heart: RRR   Extremities: No pretibial edema.   Neuro: MS is good with appropriate affect, pt is alert and Ox3     DATA REVIEWED:  Lab Results  Component Value Date   HGBA1C 11.8 (A) 03/31/2022   HGBA1C 7.6 (H) 11/07/2021   HGBA1C 9.0 (A) 05/14/2021   Lab Results  Component Value Date   LDLCALC 105 (H) 11/25/2021   CREATININE 1.61 (H) 02/25/2022   No results found for: "MICRALBCREAT"   Lab Results  Component Value Date   CHOL 202 (H) 11/25/2021   HDL 47 11/25/2021   LDLCALC 105 (H) 11/25/2021    LDLDIRECT 96 02/25/2022   TRIG 297 (H) 11/25/2021   CHOLHDL 4.3 11/25/2021         ASSESSMENT / PLAN / RECOMMENDATIONS:   1) Type 2 Diabetes Mellitus, Poorly controlled, With CKD III and macrovascular complications - Most recent A1c of 11.8 %. Goal A1c < 7.0 %.    - Poorly controlled diabetes due to dietary indiscretoins and medication non-adherence  - Barriers to diabetes is depression , I have urged her to address this with PCP  - She is dependent on her spouse to apply CGM ? So she did not have CGM when he was sick. We did discuss self application on abdomen and we also discussed arm maneuvering to be able to apply on the arm if needed without depending on her husband  - Not a candidate for SGLT-2i due to chronic UTI's  - Limited glycemic agents due to CKD - Discussed risk of blindness, ESRD and increased risk of amputation with hyperglycemia  - I offered CDE referral  - Will start prandial insulin before each meal  - A sample of dexcom G7 sent   MEDICATIONS: Continue Trulicity  4.5 mg weekly  Take tresiba 70 units daily  Novolog per correction scale (BG-130/20) TIDQAC  EDUCATION / INSTRUCTIONS: BG monitoring instructions: Patient is instructed to check her blood sugars 3 times a day, before each meal . Call [facility] if: BG persistently < 70  I reviewed the Rule of 15 for the treatment of hypoglycemia in detail with the patient. Literature supplied.    2) Diabetic complications:  Eye: Does not have known diabetic retinopathy.  Neuro/ Feet: Does not have known diabetic peripheral neuropathy .  Renal: Patient does  have known baseline CKD. She   is not on an ACEI/ARB at present. She had endorses weakness with losartan       F/U in 6 months     Signed electronically by: Mack Guise, MD  [facility] End[facility]  [address] [place], [place] [address] Phone: [phone] FAX:  [phone]   CC: [name], MD [address] [place] [address] Phone: [phone]  Fax: [phone]  Return to Endocrinology clinic as below: Future Appointments  Date Time Provider [place]  04/16/2022  1:30 PM [facility]  05/26/2022  2:30 PM Rondel Jumbo, PA-C [facility] None  06/12/2022  1:00 PM [name], [facility] None

## 2022-03-31 ENCOUNTER — Ambulatory Visit: Payer: Medicare HMO | Admitting: Internal Medicine

## 2022-03-31 ENCOUNTER — Encounter: Payer: Self-pay | Admitting: Internal Medicine

## 2022-03-31 VITALS — BP 148/76 | HR 103 | Ht 65.0 in | Wt 268.4 lb

## 2022-03-31 DIAGNOSIS — N1831 Chronic kidney disease, stage 3a: Secondary | ICD-10-CM

## 2022-03-31 DIAGNOSIS — Z794 Long term (current) use of insulin: Secondary | ICD-10-CM | POA: Diagnosis not present

## 2022-03-31 DIAGNOSIS — E1122 Type 2 diabetes mellitus with diabetic chronic kidney disease: Secondary | ICD-10-CM

## 2022-03-31 DIAGNOSIS — E119 Type 2 diabetes mellitus without complications: Secondary | ICD-10-CM | POA: Insufficient documentation

## 2022-03-31 DIAGNOSIS — E1165 Type 2 diabetes mellitus with hyperglycemia: Secondary | ICD-10-CM

## 2022-03-31 DIAGNOSIS — E1159 Type 2 diabetes mellitus with other circulatory complications: Secondary | ICD-10-CM

## 2022-03-31 LAB — POCT GLUCOSE (DEVICE FOR HOME USE): POC Glucose: 386 mg/dl — AB (ref 70–99)

## 2022-03-31 LAB — POCT GLYCOSYLATED HEMOGLOBIN (HGB A1C): Hemoglobin A1C: 11.8 % — AB (ref 4.0–5.6)

## 2022-03-31 MED ORDER — TRESIBA FLEXTOUCH 200 UNIT/ML ~~LOC~~ SOPN: 70.0000 [IU] | PEN_INJECTOR | Freq: Every day | SUBCUTANEOUS | 3 refills | Status: DC

## 2022-03-31 MED ORDER — GLOBAL EASE INJECT PEN NEEDLES 32G X 4 MM MISC: 1.0000 | Freq: Four times a day (QID) | 3 refills | Status: DC

## 2022-03-31 MED ORDER — TRULICITY 4.5 MG/0.5ML ~~LOC~~ SOAJ: 4.5000 mg | SUBCUTANEOUS | 3 refills | Status: DC

## 2022-03-31 MED ORDER — DEXCOM G7 SENSOR MISC: 1.0000 | 3 refills | Status: DC

## 2022-03-31 MED ORDER — NOVOLOG FLEXPEN 100 UNIT/ML ~~LOC~~ SOPN: PEN_INJECTOR | SUBCUTANEOUS | 3 refills | Status: DC

## 2022-03-31 NOTE — Patient Instructions (Addendum)
Continue Trulicity 4.5 mg weekly  Take Tresiba 70 units DAILY  Novlog correctional insulin:Use the scale below to help guide you before each meal   Blood sugar before meal Number of units to inject  Less than 150 0 unit  151 -  170 1 units  171 -  190 2 units  191 -  210 3 units  211 -  230 4 units  231 -  250 5 units  251 -  270 6 units  271 -  290 7 units  291 -  310 8 units   HOW TO TREAT LOW BLOOD SUGARS (Blood sugar LESS THAN 70 MG/DL) Please follow the RULE OF 15 for the treatment of hypoglycemia treatment (when your (blood sugars are less than 70 mg/dL)   STEP 1: Take 15 grams of carbohydrates when your blood sugar is low, which includes:  3-4 GLUCOSE TABS  OR 3-4 OZ OF JUICE OR REGULAR SODA OR ONE TUBE OF GLUCOSE GEL    STEP 2: RECHECK blood sugar in 15 MINUTES STEP 3: If your blood sugar is still low at the 15 minute recheck --> then, go back to STEP 1 and treat AGAIN with another 15 grams of carbohydrates.

## 2022-04-01 ENCOUNTER — Telehealth: Payer: Self-pay

## 2022-04-01 NOTE — Telephone Encounter (Signed)
Kathleen Hatfield with [facility] called to let Dr [name] know they will be faxing information obtained from annual health well being assessment to [phone].sending note to Dr [name] and [name] pool as Juluis Rainier.

## 2022-04-01 NOTE — Telephone Encounter (Signed)
Noted.  Please pull this for me when it comes in and let me know.  Thanks.

## 2022-04-02 ENCOUNTER — Other Ambulatory Visit: Payer: Self-pay | Admitting: Family Medicine

## 2022-04-10 DIAGNOSIS — E1165 Type 2 diabetes mellitus with hyperglycemia: Secondary | ICD-10-CM | POA: Diagnosis not present

## 2022-04-16 ENCOUNTER — Other Ambulatory Visit: Payer: Medicare HMO

## 2022-04-16 ENCOUNTER — Ambulatory Visit (INDEPENDENT_AMBULATORY_CARE_PROVIDER_SITE_OTHER)
Admission: RE | Admit: 2022-04-16 | Discharge: 2022-04-16 | Disposition: A | Discharge status: Home / Self Care | Payer: Medicare HMO | Source: Ambulatory Visit | Attending: Family Medicine | Admitting: Family Medicine

## 2022-04-16 DIAGNOSIS — J189 Pneumonia, unspecified organism: Secondary | ICD-10-CM | POA: Diagnosis not present

## 2022-04-16 DIAGNOSIS — R918 Other nonspecific abnormal finding of lung field: Secondary | ICD-10-CM | POA: Diagnosis not present

## 2022-04-16 DIAGNOSIS — R062 Wheezing: Secondary | ICD-10-CM | POA: Diagnosis not present

## 2022-04-21 DIAGNOSIS — J449 Chronic obstructive pulmonary disease, unspecified: Secondary | ICD-10-CM | POA: Diagnosis not present

## 2022-04-25 ENCOUNTER — Other Ambulatory Visit: Payer: Self-pay | Admitting: Pulmonary Disease

## 2022-04-27 ENCOUNTER — Telehealth: Payer: Self-pay

## 2022-04-27 NOTE — Progress Notes (Signed)
Care Management & Coordination Services Pharmacy Team  Reason for Encounter: Medication coordination and delivery  Contacted patient to discuss medications and coordinate delivery from [facility]. Spoke with patient on 04/30/2022  Cycle dispensing form sent to [facility]  for review.   Last adherence delivery date:04/08/22      Patient is due for next adherence delivery on: 05/07/22  This delivery to include: Adherence Packaging  30 Days  Bupropion HCL XL 300mg - take 1 tablet at evening meal Cholecalciferol (vit D3)-5000units- take 1 tablet breakfast every other day  Ropinirole 4mg - take 1 tablet breakfast 1 tablet bedtime  Pantoprazole 40mg - take 1 tablet bedtime Spironolactone 25mg - take 1 tablet breakfast Furosemide 20mg  take 3 tablets breakfast and dinner Duloxetine 60mg - take 1 tablet breakfast Ferrous Sulfate 325mg - take 1 tablet evening meal every other day  Gabapentin 300mg -take 1 tablet bedtime Aspirin 81mg  -take 1 tablet at breakfast  Vials: Novolog Flex pen Pen needles 32g  Patient declined the following medications this month: Cetirizine 10mg - supply on hand  Gabapentin 300mg - supply on hand   Refills requested from providers include: duloxetine,ropinirole   Confirmed delivery date of 05/07/22, advised patient that pharmacy will contact them the morning of delivery.   Any concerns about your medications? No  How often do you forget or accidentally miss a dose? Never  Do you use a pillbox? No  Is patient in packaging Yes  What is the date on your next pill pack? Not home to read date   Any concerns or issues with your packaging?satisfied   Recent blood pressure readings are as follows:none available   Recent blood glucose readings are as follows:none available    Chart review: Recent office visits:  None since last contact   Recent consult visits:  03/31/22-Ibtehal Shamleffer,MD(endo)f/u DM, Labs,(A1c 11.8) Will start prandial insulin  before each meal - A sample of dexcom G7 sent -take home BG's,f/u 6 monnths  Hospital visits:  None in previous 6 months  Medications: Outpatient Encounter Medications as of 04/27/2022  Medication Sig   acetaminophen (TYLENOL) 500 MG tablet Take 500 mg by mouth every 6 (six) hours as needed for moderate pain.   albuterol (VENTOLIN HFA) 108 (90 Base) MCG/ACT inhaler INHALE TWO PUFFS BY MOUTH INTO LUNGS every SIX hours AS NEEDED   amoxicillin-clavulanate (AUGMENTIN) 875-125 MG tablet Take 1 tablet by mouth 2 (two) times daily.   aspirin EC 81 MG tablet Take 1 tablet (81 mg total) by mouth daily. Swallow whole.   BAYER CONTOUR NEXT TEST test strip 1 each by Other route daily. As directed   benzonatate (TESSALON) 200 MG capsule Take 1 capsule (200 mg total) by mouth 3 (three) times daily as needed.   budesonide (PULMICORT) 0.5 MG/2ML nebulizer solution Take 2 mLs (0.5 mg total) by nebulization 2 (two) times daily.   buPROPion (WELLBUTRIN XL) 300 MG 24 hr tablet TAKE ONE TABLET BY MOUTH EVERY MORNING   cetirizine (ZYRTEC) 10 MG tablet Take 10 mg by mouth at bedtime.   Cholecalciferol (VITAMIN D-3) 5000 UNITS TABS Take 5,000 Units by mouth every other day.    colchicine 0.6 MG tablet TAKE 1 TABLET (0.6 MG TOTAL) BY MOUTH DAILY AS NEEDED (FOR GOUT)   Continuous Blood Gluc Receiver ([place]) DEVI by Does not apply route.   Continuous Blood Gluc Sensor (DEXCOM G7 SENSOR) MISC 1 Device by Does not apply route as directed. Apply sensor every 10 days   dicyclomine (BENTYL) 20 MG tablet TAKE 1 TABLET UP  TO FOUR TIMES A DAY FOR GI CRAMPING, PAIN, NAUSEA, AND VOMITING AS NEEDED   doxycycline (VIBRA-TABS) 100 MG tablet Take 1 tablet (100 mg total) by mouth 2 (two) times daily.   Dulaglutide (TRULICITY) 4.5 XN/2.3FT SOPN 4.5 mg by Other route once a week.   DULoxetine (CYMBALTA) 60 MG capsule TAKE ONE CAPSULE BY MOUTH EVERYDAY AT BEDTIME   estradiol (ESTRACE) 0.1 MG/GM vaginal cream Place 1  Applicatorful vaginally 3 (three) times a week.   Evolocumab (REPATHA SURECLICK) 732 MG/ML SOAJ Inject 140 mg into the skin every 14 (fourteen) days.   fluticasone (FLONASE) 50 MCG/ACT nasal spray Place 2 sprays into both nostrils daily.   formoterol (PERFOROMIST) 20 MCG/2ML nebulizer solution Take 2 mLs (20 mcg total) by nebulization 2 (two) times daily.   furosemide (LASIX) 20 MG tablet Take 3 tablets (60 mg total) by mouth 2 (two) times daily.   gabapentin (NEURONTIN) 300 MG capsule TAKE ONE CAPSULE BY MOUTH EVERYDAY AT BEDTIME May take additional capsule daily if needed   insulin aspart (NOVOLOG FLEXPEN) 100 UNIT/ML FlexPen Max daily 45 units   insulin degludec (TRESIBA FLEXTOUCH) 200 UNIT/ML FlexTouch Pen Inject 70 Units into the skin daily.   Insulin Pen Needle (GLOBAL EASE INJECT PEN NEEDLES) 32G X 4 MM MISC 1 Device by Other route in the morning, at noon, in the evening, and at bedtime.   ipratropium-albuterol (DUONEB) 0.5-2.5 (3) MG/3ML SOLN Take 3 mLs by nebulization every 6 (six) hours as needed (wheezing).   Iron, Ferrous Sulfate, 325 (65 Fe) MG TABS Take 325 mg by mouth 3 (three) times a week.   meloxicam (MOBIC) 15 MG tablet Take 7.5 mg by mouth daily as needed for pain.   metolazone (ZAROXOLYN) 5 MG tablet TAKE 1 TABLET BY MOUTH EVERY DAY AS NEEDED   MICROLET LANCETS MISC 1 each by Other route. As directed   nitrofurantoin (MACRODANTIN) 50 MG capsule Take 50 mg by mouth daily.   nystatin (MYCOSTATIN/NYSTOP) powder Apply 1 application topically 3 (three) times daily.   ondansetron (ZOFRAN) 4 MG tablet TAKE ONE TABLET BY MOUTH every EIGHT hours AS NEEDED FOR NAUSEA AND VOMITING   OXYGEN Inhale 2 L into the lungs continuous.   pantoprazole (PROTONIX) 40 MG tablet TAKE ONE TABLET BY MOUTH EVERY MORNING   polyethylene glycol (MIRALAX / GLYCOLAX) packet Take 17 g by mouth daily as needed for mild constipation.   potassium chloride (MICRO-K) 10 MEQ CR capsule Take 5 capsules (50 mEq  total) by mouth daily.   primidone (MYSOLINE) 50 MG tablet Take 0.25 tablets by mouth daily.   revefenacin (YUPELRI) 175 MCG/3ML nebulizer solution Inhale one vial in nebulizer once daily. Do not mix with other nebulized medications.   rOPINIRole (REQUIP) 4 MG tablet TAKE ONE TABLET BY MOUTH EVERY MORNING and TAKE ONE TABLET BY MOUTH EVERYDAY AT BEDTIME   simethicone (GAS-X) 80 MG chewable tablet Chew 1 tablet (80 mg total) by mouth every 6 (six) hours as needed for flatulence.   spironolactone (ALDACTONE) 25 MG tablet TAKE ONE TABLET BY MOUTH ONCE DAILY   traMADol (ULTRAM) 50 MG tablet Take 1 tablet (50 mg total) by mouth 3 (three) times daily as needed.   UNABLE TO FIND CPAP- At bedtime   No facility-administered encounter medications on file as of 04/27/2022.   BP Readings from Last 3 Encounters:  03/31/22 (!) 148/76  03/12/22 (!) 120/58  03/03/22 108/87    Pulse Readings from Last 3 Encounters:  03/31/22 (!) 103  03/12/22  83  03/03/22 90    Lab Results  Component Value Date/Time   HGBA1C 11.8 (A) 03/31/2022 09:20 AM   HGBA1C 7.6 (H) 11/07/2021 03:47 PM   HGBA1C 9.0 (A) 05/14/2021 03:10 PM   HGBA1C 10.9 (H) 02/14/2021 06:59 AM   Lab Results  Component Value Date   CREATININE 1.61 (H) 02/25/2022   BUN 27 02/25/2022   GFR 48.76 (L) 02/27/2021   GFRNONAA 48 (L) 03/31/2021   GFRAA 69 01/19/2020   NA 136 02/25/2022   K 4.2 02/25/2022   CALCIUM 10.1 02/25/2022   CO2 27 02/25/2022     [name], PharmD notified  [name], [place] Assistant [phone]

## 2022-04-29 ENCOUNTER — Other Ambulatory Visit: Payer: Self-pay | Admitting: Family Medicine

## 2022-05-01 ENCOUNTER — Other Ambulatory Visit: Payer: Self-pay | Admitting: Family Medicine

## 2022-05-01 ENCOUNTER — Other Ambulatory Visit: Payer: Self-pay | Admitting: Physician Assistant

## 2022-05-01 ENCOUNTER — Telehealth: Payer: Self-pay | Admitting: Cardiovascular Disease

## 2022-05-01 DIAGNOSIS — R5382 Chronic fatigue, unspecified: Secondary | ICD-10-CM

## 2022-05-01 MED ORDER — FUROSEMIDE 20 MG PO TABS: 60.0000 mg | ORAL_TABLET | Freq: Two times a day (BID) | ORAL | 3 refills | Status: DC

## 2022-05-01 NOTE — Telephone Encounter (Signed)
Received call from Brandy at [facility] wanting to confirm dose on Furosemide. She states they received an Rx today stating Furosemide '40mg'$  daily, but prescribing quantity was #540.  Last OV with Richardson Dopp, PA on 02/25/22 shows:  (HFpEF) heart failure with preserved ejection fraction (HCC) NYHA III.  Her dyspnea is mainly related to function limitations related to COPD, deconditioning, obesity.  Volume status appears stable on exam today.  Her weights have been stable.  Obtain follow-up CMET, magnesium.  Continue furosemide 60 mg twice daily, metolazone 5 mg daily as needed, K+ 10 mEq 5 tabs (50 mEq) daily, spironolactone 25 mg daily.  Result note from CMET ordered on 02/25/22:  Imogene Burn, PA-C 02/26/2022  7:50 AM EST     Covering for [employer]. Glucose at a dangerous level over 500. This could be causing a lot of her symptoms. Kidney function up slightly but similar to before.LDL 96 and being followed by lipid clinic. Please have her f/u with PCP concerning her uncontrolled diabetes who I have copied. All other labs stable. thanks   Asked pharmacy to cancel what they received and that I would send over another RX with correct dose of '60mg'$  BID.

## 2022-05-10 DIAGNOSIS — E1165 Type 2 diabetes mellitus with hyperglycemia: Secondary | ICD-10-CM | POA: Diagnosis not present

## 2022-05-18 ENCOUNTER — Other Ambulatory Visit: Payer: Self-pay | Admitting: Family Medicine

## 2022-05-20 DIAGNOSIS — J449 Chronic obstructive pulmonary disease, unspecified: Secondary | ICD-10-CM | POA: Diagnosis not present

## 2022-05-22 ENCOUNTER — Ambulatory Visit (INDEPENDENT_AMBULATORY_CARE_PROVIDER_SITE_OTHER): Payer: Medicare HMO | Admitting: Family Medicine

## 2022-05-22 ENCOUNTER — Encounter: Payer: Self-pay | Admitting: Family Medicine

## 2022-05-22 VITALS — BP 104/60 | HR 93 | Temp 97.3°F | Ht 65.0 in | Wt 268.0 lb

## 2022-05-22 DIAGNOSIS — E782 Mixed hyperlipidemia: Secondary | ICD-10-CM | POA: Diagnosis not present

## 2022-05-22 DIAGNOSIS — R059 Cough, unspecified: Secondary | ICD-10-CM | POA: Diagnosis not present

## 2022-05-22 DIAGNOSIS — R110 Nausea: Secondary | ICD-10-CM

## 2022-05-22 DIAGNOSIS — I10 Essential (primary) hypertension: Secondary | ICD-10-CM

## 2022-05-22 MED ORDER — MELOXICAM 7.5 MG PO TABS: 7.5000 mg | ORAL_TABLET | Freq: Every day | ORAL | 3 refills | Status: DC | PRN

## 2022-05-22 MED ORDER — ONDANSETRON HCL 4 MG PO TABS: ORAL_TABLET | ORAL | 3 refills | Status: DC

## 2022-05-22 MED ORDER — SPIRONOLACTONE 25 MG PO TABS: 12.5000 mg | ORAL_TABLET | Freq: Every day | ORAL | Status: DC

## 2022-05-22 MED ORDER — ROPINIROLE HCL 4 MG PO TABS: ORAL_TABLET | ORAL | Status: DC

## 2022-05-22 NOTE — Progress Notes (Unsigned)
Cough, dry, for a few weeks.  Occ lightheaded, BP 104/60.  Prev cough improved, then cough returned in the last few weeks.  No fevers.  Some occ sweats.  No vomiting but some occ nausea at baseline in the afternoon.  Cough not improved with neb use.  Some wheeze.  H/o sprintime allergies.  Not yet on flonase, she can use med but hasn't been on recently.   She was prev taking meloxicam prn, 2-3 times per week with relief of joint pain.  Nsaid cautions d/w pt.   Zofran used prn, no ADE on med.  It helped with nausea.    D/w pt about trying to taper requip.  When she cut back to 1 tab BID.  Discussed gradual taper.  She doesn't have tremor noted at OV.    She felt worse with change to repatha.  She is still on med.  More aches in the meantime.  She didn't notice this with praluent.  Last repatha shot about 1 week ago.    She was asking about possible ozempic start but she is already on trulicity.  Sugar variable, 200-300s, w/o <100.  No 400s, ie improved some from prior.    Primidone helped her tremor.    Meds, vitals, and allergies reviewed.   ROS: Per HPI unless specifically indicated in ROS section   TM wnl Nasal irritation.  Mild OP irritation.  CTAB Trace BLE edema.

## 2022-05-22 NOTE — Patient Instructions (Addendum)
Stop repatha in the meantime and see how you feel.   Cut spironolactone in half and let know about your BP next week.  Restart flonase and update me if the cough isn't getting better.   Take care.  Glad to see you.

## 2022-05-24 NOTE — Assessment & Plan Note (Signed)
She can restart flonase and update me if the cough isn't getting better.

## 2022-05-24 NOTE — Assessment & Plan Note (Signed)
With relative hypotension noted.  She can cut spironolactone in half and let know about her BP next week.

## 2022-05-24 NOTE — Assessment & Plan Note (Signed)
Unclear if any of her symptoms are related to Repatha use.  Reasonable to stop repatha in the meantime and she can see how she feels/update me.

## 2022-05-25 ENCOUNTER — Other Ambulatory Visit: Payer: Self-pay | Admitting: Family Medicine

## 2022-05-26 ENCOUNTER — Encounter: Payer: Self-pay | Admitting: Physician Assistant

## 2022-05-26 ENCOUNTER — Other Ambulatory Visit: Payer: Self-pay

## 2022-05-26 ENCOUNTER — Other Ambulatory Visit: Payer: Self-pay | Admitting: Family Medicine

## 2022-05-26 ENCOUNTER — Ambulatory Visit: Payer: Medicare HMO | Admitting: Physician Assistant

## 2022-05-26 VITALS — BP 129/78 | HR 74 | Resp 18 | Ht 65.0 in | Wt 268.0 lb

## 2022-05-26 DIAGNOSIS — R251 Tremor, unspecified: Secondary | ICD-10-CM

## 2022-05-26 DIAGNOSIS — G2581 Restless legs syndrome: Secondary | ICD-10-CM

## 2022-05-26 MED ORDER — SPIRONOLACTONE 25 MG PO TABS: 12.5000 mg | ORAL_TABLET | Freq: Every day | ORAL | 1 refills | Status: DC

## 2022-05-26 NOTE — Progress Notes (Signed)
Assessment/Plan:   Tremors of unclear etiology  Patient was last seen on 11/25/2021, at which time low-dose primidone at 50 mg, half tablet at bedtime with plans to increase it to one tab at bedtime, but due to increased somnolence, patient  did not take it as prescribed.  Instead , she is taking it at 1/4 tab every other day "only on the bad days", therefore without its full therapeutic benefit. Tremors are at rest and on intention, and with the medicine she is able to perform some of her ADLs.  She does not drop objects but has difficulty picking them. She has to use a walker to ambulate or to lean against it, especially since she has RLS as well and has not been taking her meds as prescribed either, currently on ropinirole 1 at night  and half in the morning, and gabapentin 300 mg at bedtime   On exam, mild tremors B, worse on the R , intermittent. She states that her mood has not been good which may affect her movements, there is a question of functional tremors as well. She reports that she is entertaining psychotherapy because "2 doctors told me that I need to treat my depression"      Recommendations:   Discussed the importance of taking the medications as directed.  Take primidone 50 mg , 1/2 tab at night for 1 week, the 1 tab at night thereafter Take ropinirole 2 mg in the morning, 4 mg in the evening for 2 weeks, and then decrease to 2 mg twice per day for 2 weeks and then 2 mg at bedtime . She was instructed that then gabapentin can be used at 300 mg a day  Referral to PT for strength and balance  Agree with Psychotherapy for depression  She was once again been educated about COPD meds and tremors Follow up in 6 months   Subjective:    Kathleen Hatfield was seen today in follow up for tremors. She is here alone. Last seen on 11/25/21.  Tremors are at rest and on intention, and with the medicine she is able to perform some of her ADLs.  She does not drop objects but has difficulty  picking them. She has trouble buttoning . She has to use a walker to ambulate or to lean against it, especially since she has RLS as well and has not been taking her meds as prescribed either . No excessive drooling or trouble swallowing. She states that her mood has not been good which may affect her movements, there is a question of functional tremors as well. "My RLS is acting out". She needs a walker or a wheelchair to ambulate "I am unstable on my feet"        ALLERGIES:   Allergies  Allergen Reactions   Almond Oil Anaphylaxis, Shortness Of Breath and Swelling   Morphine And Related Shortness Of Breath and Other (See Comments)    Pt. States while in the hospital it affected her breathing, O2 dropped to the 70's   Atorvastatin Other (See Comments)    Leg weakness   Ceclor [Cefaclor] Nausea And Vomiting   Sulfa Antibiotics Nausea And Vomiting   Ciprofloxacin Other (See Comments)    Makes joints and muscles ache   Levaquin [Levofloxacin] Other (See Comments)    Body aches   Losartan Other (See Comments)    Weakness    Statins Other (See Comments)    Leg and body weakness    CURRENT  MEDICATIONS:  Outpatient Encounter Medications as of 05/26/2022  Medication Sig   acetaminophen (TYLENOL) 500 MG tablet Take 500 mg by mouth every 6 (six) hours as needed for moderate pain.   albuterol (VENTOLIN HFA) 108 (90 Base) MCG/ACT inhaler INHALE TWO PUFFS BY MOUTH INTO LUNGS every SIX hours AS NEEDED   aspirin EC 81 MG tablet Take 1 tablet (81 mg total) by mouth daily. Swallow whole.   BAYER CONTOUR NEXT TEST test strip 1 each by Other route daily. As directed   budesonide (PULMICORT) 0.5 MG/2ML nebulizer solution Take 2 mLs (0.5 mg total) by nebulization 2 (two) times daily.   buPROPion (WELLBUTRIN XL) 300 MG 24 hr tablet TAKE ONE TABLET BY MOUTH EVERY MORNING   Cholecalciferol (VITAMIN D-3) 5000 UNITS TABS Take 5,000 Units by mouth every other day.    colchicine 0.6 MG tablet TAKE 1 TABLET  (0.6 MG TOTAL) BY MOUTH DAILY AS NEEDED (FOR GOUT)   Continuous Blood Gluc Receiver ([place]) DEVI by Does not apply route.   Continuous Blood Gluc Sensor (DEXCOM G7 SENSOR) MISC 1 Device by Does not apply route as directed. Apply sensor every 10 days   dicyclomine (BENTYL) 20 MG tablet TAKE 1 TABLET UP TO FOUR TIMES A DAY FOR GI CRAMPING, PAIN, NAUSEA, AND VOMITING AS NEEDED   Dulaglutide (TRULICITY) 4.5 0000000 SOPN 4.5 mg by Other route once a week.   DULoxetine (CYMBALTA) 60 MG capsule TAKE ONE CAPSULE BY MOUTH EVERYDAY AT BEDTIME   estradiol (ESTRACE) 0.1 MG/GM vaginal cream Place 1 Applicatorful vaginally 3 (three) times a week.   FEROSUL 325 (65 Fe) MG tablet TAKE ONE TABLET BY MOUTH every other evening   fluticasone (FLONASE) 50 MCG/ACT nasal spray Place 2 sprays into both nostrils daily.   formoterol (PERFOROMIST) 20 MCG/2ML nebulizer solution Take 2 mLs (20 mcg total) by nebulization 2 (two) times daily.   furosemide (LASIX) 20 MG tablet Take 3 tablets (60 mg total) by mouth 2 (two) times daily.   gabapentin (NEURONTIN) 300 MG capsule TAKE ONE CAPSULE BY MOUTH EVERYDAY AT BEDTIME May take additional capsule daily if needed   insulin aspart (NOVOLOG FLEXPEN) 100 UNIT/ML FlexPen Max daily 45 units   insulin degludec (TRESIBA FLEXTOUCH) 200 UNIT/ML FlexTouch Pen Inject 70 Units into the skin daily.   Insulin Pen Needle (GLOBAL EASE INJECT PEN NEEDLES) 32G X 4 MM MISC 1 Device by Other route in the morning, at noon, in the evening, and at bedtime.   ipratropium-albuterol (DUONEB) 0.5-2.5 (3) MG/3ML SOLN Take 3 mLs by nebulization every 6 (six) hours as needed (wheezing).   ketoconazole (NIZORAL) 2 % cream APPLY TO AFFECTED AREA TWICE A DAY   meloxicam (MOBIC) 7.5 MG tablet Take 1 tablet (7.5 mg total) by mouth daily as needed for pain.   metolazone (ZAROXOLYN) 5 MG tablet TAKE 1 TABLET BY MOUTH EVERY DAY AS NEEDED   MICROLET LANCETS MISC 1 each by Other route. As directed    nitrofurantoin (MACRODANTIN) 50 MG capsule Take 50 mg by mouth daily.   nystatin (MYCOSTATIN/NYSTOP) powder Apply 1 application topically 3 (three) times daily.   ondansetron (ZOFRAN) 4 MG tablet TAKE ONE TABLET BY MOUTH every EIGHT hours AS NEEDED FOR NAUSEA AND VOMITING   OXYGEN Inhale 2 L into the lungs continuous.   pantoprazole (PROTONIX) 40 MG tablet TAKE ONE TABLET BY MOUTH EVERY MORNING   polyethylene glycol (MIRALAX / GLYCOLAX) packet Take 17 g by mouth daily as needed for mild constipation.  potassium chloride (MICRO-K) 10 MEQ CR capsule Take 5 capsules (50 mEq total) by mouth daily.   primidone (MYSOLINE) 50 MG tablet Take 0.25 tablets by mouth daily.   revefenacin (YUPELRI) 175 MCG/3ML nebulizer solution Inhale one vial in nebulizer once daily. Do not mix with other nebulized medications.   rOPINIRole (REQUIP) 4 MG tablet TAKE 0.5-1 TABLET BY MOUTH EVERY MORNING and TAKE 1 TABLET BY MOUTH EVERYDAY AT BEDTIME   simethicone (GAS-X) 80 MG chewable tablet Chew 1 tablet (80 mg total) by mouth every 6 (six) hours as needed for flatulence.   traMADol (ULTRAM) 50 MG tablet Take 1 tablet (50 mg total) by mouth 3 (three) times daily as needed.   UNABLE TO FIND CPAP- At bedtime   [DISCONTINUED] spironolactone (ALDACTONE) 25 MG tablet Take 0.5 tablets (12.5 mg total) by mouth daily.   No facility-administered encounter medications on file as of 05/26/2022.    Objective:   PHYSICAL EXAMINATION:    VITALS:   Vitals:   05/26/22 1425  BP: 129/78  Pulse: 74  Resp: 18  SpO2: 98%  Weight: 268 lb (121.6 kg)  Height: 5\' 5"  (1.651 m)    GEN:  The patient appears stated age and is in NAD. HEENT:  Normocephalic, atraumatic.  The mucous membranes are moist. The superficial temporal arteries are without ropiness or tenderness.  Neurological examination:  Orientation: The patient is alert and oriented x3. Cranial nerves: There is good facial symmetry with facial hypomimia. The speech is  fluent and clear. Soft palate rises symmetrically and there is no tongue deviation. Hearing is intact to conversational tone. Sensation: Sensation is intact to light touch throughout Motor: Strength is at least antigravity x4.  Movement examination: Tone: There is normal tone in the upper and lower extremities  Abnormal movements: No postural  mild B  rest tremor.  Tremors when holding objects.  Drawls spirals abnormal B Coordination:  There is no  decremation with RAM's or FNF,   Gait and Station: The patient has difficulty arising out of a deep-seated chair without the use of the hands. The patient's stride length is short.  Gait is wide the base and has difficulty turning      Total time spent on today's visit was 30 minutes, including both face-to-face time and nonface-to-face time.  Time included that spent on review of records (prior notes available to me/labs/imaging if pertinent), discussing treatment and goals, answering patient's questions and coordinating care.  Cc:  [name], MD

## 2022-05-26 NOTE — Patient Instructions (Signed)
Take primidone 50 mg - 1/2 tablet at bedtime for 1 week and then increase to 1 tablet at bedtime thereafter. Physical therapy for strength and balance  Follow up in 6 months with Dr. Carles Collet   The physicians and staff at [facility] are committed to providing excellent care. You may receive a survey requesting feedback about your experience at our office. We strive to receive "very good" responses to the survey questions. If you feel that your experience would prevent you from giving the office a "very good " response, please contact our office to try to remedy the situation. We may be reached at [phone]. Thank you for taking the time out of your busy day to complete the survey.

## 2022-05-27 ENCOUNTER — Telehealth: Payer: Self-pay

## 2022-05-27 NOTE — Progress Notes (Signed)
Care Management & Coordination Services Pharmacy Team  Reason for Encounter: Medication coordination and delivery  Contacted patient to discuss medications and coordinate delivery from [facility]. Spoke with patient on 05/29/2022  Cycle dispensing form sent to [facility]  for review.   Last adherence delivery date:05/07/22      Patient is due for next adherence delivery on: 06/08/22  This delivery to include: Adherence Packaging  30 Days  Bupropion HCL XL 300mg - take 1 tablet at evening meal Cholecalciferol (vit D3)-5000units- take 1 tablet breakfast every other day  Ropinirole 4mg - take 1 tablet breakfast 1 tablet bedtime  Pantoprazole 40mg - take 1 tablet bedtime Spironolactone 25mg - take 1 tablet breakfast Furosemide 20mg  take 3 tablets breakfast and dinner Duloxetine 60mg - take 1 tablet breakfast Ferrous Sulfate 325mg - take 1 tablet evening meal every other day  Gabapentin 300mg -take 1 tablet bedtime Aspirin 81mg  -take 1 tablet at breakfast  Vials: Novolog Flex pen Pen needles 32g  Patient declined the following medications this month: Cetirizine 10mg - supply on hand  Gabapentin 300mg - supply on hand   Refills requested from providers include: spironolactone  Confirmed delivery date of 06/08/22, advised patient that pharmacy will contact them the morning of delivery.   Any concerns about your medications? Yes- patient is taking primidone and states it is effecting her breathing,( she is using inhalers and nebulizer) Currently on home O2. She has cut the spirolactone to 1/2 and also using 1/2 tablet of ropinirole at this time, saving the other 1/2 tabs in a vial. Patient also states she has decreased tresiba to 70 units  How often do you forget or accidentally miss a dose? Never  Do you use a pillbox? No  Is patient in packaging Yes  Any concerns or issues with your packaging?satisfied   Recent blood pressure readings are as follows:none available  Recent  blood glucose readings are as follows: patient states BG running in 200's   Chart review: Recent office visits:  05/22/22-Graham Duncan,MD(PCP)-cough,D/w pt about trying to taper requip.  Prev she cut back to 1 tab BID.  Discussed gradual taper.  She doesn't have tremor noted at OV. She felt worse with change to repatha   She can cut spironolactone in half    Recent consult visits:  05/26/22-Sara Wertman,PA(neuro)-f/u tremors,Take primidone 50 mg , 1/2 tab at night for 1 week, the 1 tab at night thereafter Take ropinirole 2 mg in the morning, 4 mg in the evening for 2 weeks, and then decrease to 2 mg twice per day for 2 weeks and then 2 mg at bedtime . She was instructed that then gabapentin can be used at 300 mg a day f/u 6 months  Hospital visits:  None in previous 6 months  Medications: Outpatient Encounter Medications as of 05/27/2022  Medication Sig   acetaminophen (TYLENOL) 500 MG tablet Take 500 mg by mouth every 6 (six) hours as needed for moderate pain.   albuterol (VENTOLIN HFA) 108 (90 Base) MCG/ACT inhaler INHALE TWO PUFFS BY MOUTH INTO LUNGS every SIX hours AS NEEDED   aspirin EC 81 MG tablet Take 1 tablet (81 mg total) by mouth daily. Swallow whole.   BAYER CONTOUR NEXT TEST test strip 1 each by Other route daily. As directed   budesonide (PULMICORT) 0.5 MG/2ML nebulizer solution Take 2 mLs (0.5 mg total) by nebulization 2 (two) times daily.   buPROPion (WELLBUTRIN XL) 300 MG 24 hr tablet TAKE ONE TABLET BY MOUTH EVERY MORNING   Cholecalciferol (VITAMIN D-3) 5000 UNITS  TABS Take 5,000 Units by mouth every other day.    colchicine 0.6 MG tablet TAKE 1 TABLET (0.6 MG TOTAL) BY MOUTH DAILY AS NEEDED (FOR GOUT)   Continuous Blood Gluc Receiver ([place]) DEVI by Does not apply route.   Continuous Blood Gluc Sensor (DEXCOM G7 SENSOR) MISC 1 Device by Does not apply route as directed. Apply sensor every 10 days   dicyclomine (BENTYL) 20 MG tablet TAKE 1 TABLET UP TO FOUR  TIMES A DAY FOR GI CRAMPING, PAIN, NAUSEA, AND VOMITING AS NEEDED   Dulaglutide (TRULICITY) 4.5 0000000 SOPN 4.5 mg by Other route once a week.   DULoxetine (CYMBALTA) 60 MG capsule TAKE ONE CAPSULE BY MOUTH EVERYDAY AT BEDTIME   estradiol (ESTRACE) 0.1 MG/GM vaginal cream Place 1 Applicatorful vaginally 3 (three) times a week.   FEROSUL 325 (65 Fe) MG tablet TAKE ONE TABLET BY MOUTH every other evening   fluticasone (FLONASE) 50 MCG/ACT nasal spray Place 2 sprays into both nostrils daily.   formoterol (PERFOROMIST) 20 MCG/2ML nebulizer solution Take 2 mLs (20 mcg total) by nebulization 2 (two) times daily.   furosemide (LASIX) 20 MG tablet Take 3 tablets (60 mg total) by mouth 2 (two) times daily.   gabapentin (NEURONTIN) 300 MG capsule TAKE ONE CAPSULE BY MOUTH EVERYDAY AT BEDTIME May take additional capsule daily if needed   insulin aspart (NOVOLOG FLEXPEN) 100 UNIT/ML FlexPen Max daily 45 units   insulin degludec (TRESIBA FLEXTOUCH) 200 UNIT/ML FlexTouch Pen Inject 70 Units into the skin daily.   Insulin Pen Needle (GLOBAL EASE INJECT PEN NEEDLES) 32G X 4 MM MISC 1 Device by Other route in the morning, at noon, in the evening, and at bedtime.   ipratropium-albuterol (DUONEB) 0.5-2.5 (3) MG/3ML SOLN Take 3 mLs by nebulization every 6 (six) hours as needed (wheezing).   ketoconazole (NIZORAL) 2 % cream APPLY TO AFFECTED AREA TWICE A DAY   meloxicam (MOBIC) 7.5 MG tablet Take 1 tablet (7.5 mg total) by mouth daily as needed for pain.   metolazone (ZAROXOLYN) 5 MG tablet TAKE 1 TABLET BY MOUTH EVERY DAY AS NEEDED   MICROLET LANCETS MISC 1 each by Other route. As directed   nitrofurantoin (MACRODANTIN) 50 MG capsule Take 50 mg by mouth daily.   nystatin (MYCOSTATIN/NYSTOP) powder Apply 1 application topically 3 (three) times daily.   ondansetron (ZOFRAN) 4 MG tablet TAKE ONE TABLET BY MOUTH every EIGHT hours AS NEEDED FOR NAUSEA AND VOMITING   OXYGEN Inhale 2 L into the lungs continuous.    pantoprazole (PROTONIX) 40 MG tablet TAKE ONE TABLET BY MOUTH EVERY MORNING   polyethylene glycol (MIRALAX / GLYCOLAX) packet Take 17 g by mouth daily as needed for mild constipation.   potassium chloride (MICRO-K) 10 MEQ CR capsule Take 5 capsules (50 mEq total) by mouth daily.   primidone (MYSOLINE) 50 MG tablet Take 0.25 tablets by mouth daily.   revefenacin (YUPELRI) 175 MCG/3ML nebulizer solution Inhale one vial in nebulizer once daily. Do not mix with other nebulized medications.   rOPINIRole (REQUIP) 4 MG tablet TAKE 0.5-1 TABLET BY MOUTH EVERY MORNING and TAKE 1 TABLET BY MOUTH EVERYDAY AT BEDTIME   simethicone (GAS-X) 80 MG chewable tablet Chew 1 tablet (80 mg total) by mouth every 6 (six) hours as needed for flatulence.   spironolactone (ALDACTONE) 25 MG tablet Take 0.5 tablets (12.5 mg total) by mouth daily.   traMADol (ULTRAM) 50 MG tablet Take 1 tablet (50 mg total) by mouth 3 (three)  times daily as needed.   UNABLE TO FIND CPAP- At bedtime   No facility-administered encounter medications on file as of 05/27/2022.   BP Readings from Last 3 Encounters:  05/26/22 129/78  05/22/22 104/60  03/31/22 (!) 148/76    Pulse Readings from Last 3 Encounters:  05/26/22 74  05/22/22 93  03/31/22 (!) 103    Lab Results  Component Value Date/Time   HGBA1C 11.8 (A) 03/31/2022 09:20 AM   HGBA1C 7.6 (H) 11/07/2021 03:47 PM   HGBA1C 9.0 (A) 05/14/2021 03:10 PM   HGBA1C 10.9 (H) 02/14/2021 06:59 AM   Lab Results  Component Value Date   CREATININE 1.61 (H) 02/25/2022   BUN 27 02/25/2022   GFR 48.76 (L) 02/27/2021   GFRNONAA 48 (L) 03/31/2021   GFRAA 69 01/19/2020   NA 136 02/25/2022   K 4.2 02/25/2022   CALCIUM 10.1 02/25/2022   CO2 27 02/25/2022     [name], PharmD notified  [name], Vici Assistant [phone]

## 2022-06-02 DIAGNOSIS — G4733 Obstructive sleep apnea (adult) (pediatric): Secondary | ICD-10-CM | POA: Diagnosis not present

## 2022-06-09 ENCOUNTER — Ambulatory Visit: Payer: Medicare HMO | Attending: Physician Assistant

## 2022-06-09 DIAGNOSIS — E1165 Type 2 diabetes mellitus with hyperglycemia: Secondary | ICD-10-CM | POA: Diagnosis not present

## 2022-06-09 DIAGNOSIS — R2681 Unsteadiness on feet: Secondary | ICD-10-CM | POA: Diagnosis not present

## 2022-06-09 DIAGNOSIS — R278 Other lack of coordination: Secondary | ICD-10-CM | POA: Diagnosis not present

## 2022-06-09 DIAGNOSIS — R262 Difficulty in walking, not elsewhere classified: Secondary | ICD-10-CM | POA: Diagnosis not present

## 2022-06-09 DIAGNOSIS — R251 Tremor, unspecified: Secondary | ICD-10-CM | POA: Diagnosis not present

## 2022-06-09 DIAGNOSIS — G2581 Restless legs syndrome: Secondary | ICD-10-CM | POA: Insufficient documentation

## 2022-06-09 NOTE — Therapy (Signed)
OUTPATIENT PHYSICAL THERAPY NEURO EVALUATION   Patient Name: Kathleen Hatfield MRN: [id number] DOB:Aug 28, 1955, 67 y.o., female Today's Date: 06/09/2022   PCP: [name], MD REFERRING PROVIDER: Rondel Jumbo, PA-C   END OF SESSION:  PT End of Session - 06/09/22 1255     Visit Number 1    Number of Visits 13    Date for PT Re-Evaluation 08/04/22    Authorization Type aetna medicare    PT Start Time 1314    PT Stop Time [id number]    PT Time Calculation (min) 44 min    Activity Tolerance Patient limited by fatigue    Behavior During Therapy [facility] for tasks assessed/performed             Past Medical History:  Diagnosis Date   Anemia    Arthritis    Asthma    Chronic diastolic CHF [id number]   Echocardiogram 05/2019: EF 70, no RWMA, mild LVH, Gr 1 DD, normal RVSF, severe LVH, borderline asc Aorta (39 mm)   CKD (chronic kidney disease)    Depression    Diabetes mellitus without complication    Diverticulitis    Dyspnea    with exertion   GERD (gastroesophageal reflux disease)    History of blood transfusion    Hyperlipidemia    cannot tolerate statins   Hypertension    patient states she has never had high blood pressure.    Nuclear stress test    Myoview 05/2019: EF 83, no ischemia or infarction; Low Risk   Peripheral neuropathy    Pneumonia    PONV (postoperative nausea and vomiting)    RLS (restless legs syndrome)    Sleep apnea    uses CPAP   Ulcerative colitis    dr Collene Mares   Past Surgical History:  Procedure Laterality Date   ABDOMINAL HYSTERECTOMY     APPENDECTOMY     BIOPSY  10/10/2021   Procedure: BIOPSY;  Surgeon: Carol Ada, MD;  Location: [facility];  Service: Gastroenterology;;   BREAST BIOPSY Left    BREAST SURGERY     left biopsy   CARDIAC CATHETERIZATION     CESAREAN SECTION     CHOLECYSTECTOMY     COLONOSCOPY WITH PROPOFOL N/A 10/10/2021   Procedure: COLONOSCOPY WITH PROPOFOL;  Surgeon: Carol Ada, MD;  Location: [facility];  Service:  Gastroenterology;  Laterality: N/A;   HERNIA REPAIR     INSERTION OF MESH N/A 11/15/2015   Procedure: INSERTION OF MESH;  Surgeon: [name], MD;  Location: [facility];  Service: General;  Laterality: N/A;   LAPAROSCOPIC LYSIS OF ADHESIONS N/A 11/15/2015   Procedure: LAPAROSCOPIC LYSIS OF ADHESIONS;  Surgeon: [name], MD;  Location: [facility];  Service: General;  Laterality: N/A;   RIGHT HEART CATHETERIZATION N/A 02/27/2013   Procedure: RIGHT HEART CATH;  Surgeon: Blane Ohara, MD;  Location: [facility] CATH LAB;  Service: Cardiovascular;  Laterality: N/A;   RIGHT HEART CATHETERIZATION N/A 12/14/2013   Procedure: RIGHT HEART CATH;  Surgeon: Larey Dresser, MD;  Location: [facility] CATH LAB;  Service: Cardiovascular;  Laterality: N/A;   SIGMOIDECTOMY  2010   diverticular disease   VENTRAL HERNIA REPAIR N/A 11/15/2015   Procedure: LAPAROSCOPIC VENTRAL WALL HERNIA REPAIR;  Surgeon: [name], MD;  Location: [facility];  Service: General;  Laterality: N/A;   Patient Active Problem List   Diagnosis Date Noted   Diabetes mellitus 03/31/2022   Myalgia 02/25/2022   Prolonged Q-T interval on ECG 02/25/2022  Aortic atherosclerosis 06/04/2021   Preoperative cardiovascular examination 06/04/2021   Hyperglycemia 02/14/2021   Rash 09/27/2020   COPD exacerbation 09/21/2020   Acute exacerbation of COPD with asthma 09/19/2020   Mixed diabetic hyperlipidemia associated with type 2 diabetes mellitus 09/19/2020   Pain of foot 06/20/2020   Medicare annual wellness visit, initial 06/20/2020   Constipation 06/11/2020   Diverticulosis of colon 06/11/2020   Irritable bowel syndrome [id number]   Periumbilical pain Q000111Q   RLS (restless legs syndrome)    Asthma exacerbation 01/08/2020   Muscle weakness 01/08/2020   Grade I diastolic dysfunction [id number]   Drug-induced myopathy 10/10/2019   Advance care planning 06/14/2019   RLQ abdominal pain 06/14/2019   COPD (chronic obstructive pulmonary disease)  06/14/2019   Leukocytosis 06/14/2019   Hypercalcemia 06/14/2019   Gout 06/14/2019   Type 2 diabetes mellitus with hyperglycemia, without long-term current use of insulin    COPD with acute exacerbation 05/03/2017   Diabetes mellitus without complication    CKD (chronic kidney disease) stage 3, GFR 30-59 ml/min    Hypersomnia with sleep apnea 09/16/2015   Fatigue 06/07/2015   Morbid obesity 06/07/2015   Cough 04/18/2015   (HFpEF) heart failure with preserved ejection fraction    Pneumonia of both lower lobes due to infectious organism 02/09/2013   OSA on CPAP 08/23/2012   Essential hypertension    Depression    GERD without esophagitis    Hyperlipidemia    Ulcerative colitis ([place])    Patellar tendinitis 05/25/2011   Knee pain 05/25/2011   Myofascial pain 05/25/2011   Cervicalgia 05/25/2011    ONSET DATE:   05/26/2022  referral  REFERRING DIAG:  R25.1 (ICD-10-CM) - Tremor  G25.81 (ICD-10-CM) - Restless leg    THERAPY DIAG:  Difficulty in walking, not elsewhere classified  Other lack of coordination  Unsteadiness on feet  Rationale for Evaluation and Treatment: Rehabilitation  SUBJECTIVE:                                                                                                                                                                                             SUBJECTIVE STATEMENT: Patient arrives to clinic with spouse who remains in waiting room. She ambulates with a RW and clearly a lot of weight put through the walker. States both her knees need replacing, but she feels weak and isn't able to exercise to lose weight prior to these surgeries. Patient asking PT "what did they say they sent me here for?" Patient reports experiencing tremors primarily in both arms/hands. Does state her "legs jump" and has also had a voice "tick" in the past.  Reports that her tremors and RLS started ~5 years ago after multiple operations that she "didn't quite heal from." Her  parents passed away at that time and she was caring for them. Does use her RW most of the time, but sometimes her knees feel good- her "kneecaps are in the right place." Will furniture surf otherwise.  Pt accompanied by: self  PERTINENT HISTORY: asthma, CHF, OSA, HTN, HLD, CKD, GERD, DM, RLS  PAIN:  Are you having pain? No  PRECAUTIONS: Fall  WEIGHT BEARING RESTRICTIONS: No  FALLS: Has patient fallen in last 6 months? Yes. Number of falls 1; fell off toilet leaning too far forward  LIVING ENVIRONMENT: Lives with: lives with their spouse Lives in: House/apartment Stairs: Yes: Internal: a few steps; none Has following equipment at home: Environmental consultant - 2 wheeled and Wheelchair (manual)  PLOF: Requires assistive device for independence  PATIENT GOALS: "to work on my strength and balance"  OBJECTIVE:   COGNITION: Overall cognitive status: Within functional limits for tasks assessed   SENSATION: WFL  COORDINATION: Impaired B LE with intention tremors noted  POSTURE: rounded shoulders, forward head, increased thoracic kyphosis, posterior pelvic tilt, and flexed trunk   LOWER EXTREMITY MMT:   Grossly 4/5  BED MOBILITY:  Reports only mild difficulty   TRANSFERS: Assistive device utilized: Environmental consultant - 2 wheeled  Sit to stand: Modified independence Stand to sit: Modified independence Chair to chair: Modified independence   GAIT: Gait pattern: step through pattern, decreased stride length, shuffling, trunk flexed, and wide BOS Distance walked: clinic Assistive device utilized: Environmental consultant - 2 wheeled Level of assistance: SBA  FUNCTIONAL TESTS:   [facility] PT Assessment - 06/09/22 0001       Standardized Balance Assessment   Standardized Balance Assessment Five Times Sit to Stand    Five times sit to stand comments  27.53   BUE             PATIENT SURVEYS:  ABC scale 10%  TODAY'S TREATMENT:                                                                                                                               N/a eval   PATIENT EDUCATION: Education details: PT POC, exam findings, impact on PT with Polypharmacy  Person educated: Patient Education method: Explanation Education comprehension: verbalized understanding and needs further education  HOME EXERCISE PROGRAM: To be provided  GOALS: Goals reviewed with patient? Yes  SHORT TERM GOALS: Target date: 06/30/22  Pt will be independent with initial HEP for improved strength and endurance  Baseline: to be provided Goal status: INITIAL  2.  Pt will improve 5x STS to </= 22 sec to demo improved functional LE strength and balance   Baseline: 27.53s B UE Goal status: INITIAL  3.  Gait speed goal Baseline: to be assessed Goal status: INITIAL  4.  Berg goal Baseline: to be assessed Goal status: INITIAL  5.  Tug goal  Baseline: to be assessed Goal status: INITIAL  6.  Patient will improve ABC score to >/= 20% to demonstrate improved confidence with functional mobility Baseline: 10% Goal status: INITIAL  LONG TERM GOALS: Target date: 08/04/22  Pt will be independent with final HEP for improved strength and endurance  Baseline: to be provided Goal status: INITIAL  2.  Pt will improve 5x STS to </= 17 sec to demo improved functional LE strength and balance   Baseline: 27.53s B UE Goal status: INITIAL  3.  Tug goal Baseline: to be assessed Goal status: INITIAL  4.  Gait speed goal Baseline: to be assessed Goal status: INITIAL  5.  Berg goal Baseline: to be assessed Goal status: INITIAL  6.  Patient will improve ABC score to >/= 30% to demonstrate improved confidence with mobility Baseline: 10% Goal status: INITIAL  ASSESSMENT:  CLINICAL IMPRESSION: Patient is a 67 y.o. female who was seen today for physical therapy evaluation and treatment for tremor impacting her mobility. Patients clinical picture complicated by polypharmacy with multiple drugs known to have side effects with  the symptoms she's reporting (weakness, fatigue, tremors, etc). Five times Sit to Stand Test (FTSS) Method: Use a straight back chair with a solid seat that is 17-18" high. Ask participant to sit on the chair with arms folded across their chest.   Instructions: "Stand up and sit down as quickly as possible 5 times, keeping your arms folded across your chest."   Measurement: Stop timing when the participant touches the chair in sitting the 5th time.  TIME: 27.53 sec  Cut off scores indicative of increased fall risk: >12 sec CVA, >16 sec PD, >13 sec vestibular (ANPTA Core Set of Outcome Measures for Adults with Neurologic Conditions, 2018). She would benefit from skilled PT services to address the above mentioned deficits.    OBJECTIVE IMPAIRMENTS: Abnormal gait, cardiopulmonary status limiting activity, decreased activity tolerance, decreased balance, decreased endurance, decreased knowledge of condition, decreased knowledge of use of DME, decreased mobility, difficulty walking, decreased strength, improper body mechanics, postural dysfunction, and pain.   ACTIVITY LIMITATIONS: carrying, lifting, standing, squatting, stairs, transfers, locomotion level, and caring for others  PARTICIPATION LIMITATIONS: cleaning, interpersonal relationship, driving, shopping, community activity, occupation, and yard work  PERSONAL FACTORS: Age, Fitness, Past/current experiences, Time since onset of injury/illness/exacerbation, and 3+ comorbidities: see above  are also affecting patient's functional outcome.   REHAB POTENTIAL: Fair polypharmacy, time since onset  CLINICAL DECISION MAKING: Evolving/moderate complexity  EVALUATION COMPLEXITY: Moderate  PLAN:  PT FREQUENCY: 2x/week  PT DURATION: 6 weeks  PLANNED INTERVENTIONS: Therapeutic exercises, Therapeutic activity, Neuromuscular re-education, Balance training, Gait training, Patient/Family education, Self Care, Joint mobilization, Stair training,  Vestibular training, Orthotic/Fit training, DME instructions, Aquatic Therapy, Taping, Manual therapy, and Re-evaluation  PLAN FOR NEXT SESSION: HEP, BBS, gait speed, TUG   Debbora Dus, PT, DPT, CBIS 06/09/2022, 1:58 PM

## 2022-06-10 ENCOUNTER — Telehealth: Payer: Self-pay

## 2022-06-10 NOTE — Progress Notes (Signed)
Care Management & Coordination Services Pharmacy Team  Reason for Encounter: Appointment Reminder  Contacted patient to confirm telephone appointment with [name] , PharmD on 06/12/22 at 1:00. Spoke with patient on 06/10/2022   Do you have any problems getting your medications? No  What is your top health concern you would like to discuss at your upcoming visit? Patient reports she has questions about trying ozempic.  Have you seen any other providers since your last visit with PCP?   PT evaluation    Hospital visits:  None in previous 6 months   Star Rating Drugs:  Medication:  Last Fill: Day Supply Trulicity  [id number] Tresiba   08/11/21   Patient patient has surplus   Novolog  06/03/22 26   Care Gaps: Annual wellness visit in last year? Yes  If Diabetic: Last eye exam / retinopathy screening:2021 Last diabetic foot exam:2022   [name], PharmD notified  [name], [place] Assistant [phone]

## 2022-06-12 ENCOUNTER — Telehealth: Payer: Self-pay | Admitting: Pharmacist

## 2022-06-12 ENCOUNTER — Ambulatory Visit: Payer: Medicare HMO | Admitting: Pharmacist

## 2022-06-12 DIAGNOSIS — E119 Type 2 diabetes mellitus without complications: Secondary | ICD-10-CM

## 2022-06-12 NOTE — Progress Notes (Signed)
Care Management & Coordination Services Pharmacy Note  06/12/2022 Name:  Kathleen Hatfield MRN:  [id number] DOB:  [birth year]  Summary: F/u visit -DM: A1c 11.8% (03/2022), worsened from 7.6%, she recently started Novolog in addition to [place], Trulicity; pt is wearing Dexcom and reports few alarms lately (meaning BG stays between 70-250 much of the time). She is interested in switching to Ozempic and reports endocrine has also suggested this to her; discussed differences between Trulicity and Ozempic devices -HFpEF: pt reports swelling, some slight shortness of breath; she has not taken metolazone this week, typically takes it 1-2 times per week; weight is up to 269 lbs (dry weight unclear - pt reports it is 255 lbs however she has not seen that number in over a year, weight was 268 lbs in last 3 office visits, likely need to re-establish dry weight)  -HLD: LDL 96 (02/2022), improved from 208 with Praluent. She switched to Repatha 2024 due to insurance, but could not tolerate it due to fatigue; she did tolerate Pralent   Recommendations/Changes made from today's visit: -Recommend to switch Trulicity to Ozempic 1 mg (lower than equivalent dose of Ozempic given patient's history of multiple medication intolerances) - consulting with endocrine -Recommend BMP given ongoing 1-2x weekly metolazone use, spironolactone dose change - consulting with PCP -Recommend to switch Repatha back to Praluent 150 mg - Submitted PA for Praluent now that patient has failed Repatha (CMM key: [id number])  Follow up plan: -Health Concierge will call patient 1 month for medication coordination -Pharmacist follow up televisit scheduled for 3 months -Endocrine appt 10/06/22; Neurology appt 11/26/22    Subjective: Kathleen Hatfield is an 67 y.o. year old female who is a primary patient of Joaquim Nam, MD.  The care coordination team was consulted for assistance with disease management and care coordination needs.    Engaged with  patient by telephone for follow up visit.  Patient Care Team: Joaquim Nam, MD as PCP - General (Family Medicine) Tonny Bollman, MD as PCP - Cardiology (Cardiology) Storm Frisk, MD as Consulting Physician (Pulmonary Disease) Karie Soda, MD as Consulting Physician (General Surgery) de Vanderbilt, Hilton Cork, MD (Inactive) as Consulting Physician (Pulmonary Disease) Kathyrn Sheriff, [facility] as Pharmacist (Pharmacist) Kennon Rounds as Physician Assistant (Cardiology)  Recent office visits: 05/22/22 Dr Para March OV: f/u - feels worse on Repatha compared to Praluent. Asked about Ozempic. Cut spironolactone in 1/2.  Trial off Repatha. Restart Flonase.  03/12/22 Dr Para March OV: cough - rx Augmentin.  11/07/21 Dr Para March OV: annual - A1c 7.6%. Referred to endocrine and neurology.   Recent consult visits: 05/26/22 PA [facility] (Neurology): tremor - referred to PT. Not taking primidone as directed - take 50 mg 1/2 tab x 1 week then 1 tab thereafter. Decrease ropinirole to 2 mg BID then 2 mg HS. Agree with psychotherapy for depression.  05/01/22 Cardiology TE: updated Furosemide dose to 60 mg BID.  03/31/22 Dr Lonzo Cloud (Endocrine): A1c 11.8%, taking insulin QOD (nonadherent). Change Tresiba to 70 units daily. Start Novolog before meals. Sample of Dexcom G7 given.   03/03/22 Urgent Care: COPD exacerbation. Rx prednisone. Covid positive, rx molnupravir.   02/25/22 PA Tereso Newcomer (Cardiology): f/u HF - glucose 509. D/C fenofibrate x 2 wks (myalgias). QT prolonged today, check labs. D/c zofran. Transitioning to Repatha 2024 d/t formulary change.  11/26/21 Dr Alben Spittle (Cardiology): f/u HF - increase furosemide to 60 mg BID. Increase fenofibrate to 145 mg. Discussed SGLT2-I - she does  have hx of recurrent UTI so deferred.   11/25/21 Dr Tat (Neurology): tremor - not noted on exam, no evidence essential tremor. COPD meds may be driving tremor. Try primidone 50 mg. For RLS - ropinirole needs to be  decreased - trial decrease to 2 mg AM, 4 mg PM x 2 weeks, then 2 mg BID x 2 weeks, then 2 mg only HS. Trial gabapentin 300 mg BID at same time to compensate.    10/30/21 Urgent Care - Pyruria, myalgia. Rx'd Keflex. Urine cx was negative, advised to d/c abx.   09/03/21 Cardiology TE - discuss lipid results on Praluent. Not at goal yet, pt declines additional med. Recheck lipids 6 weeks. 08/27/21-[name],MD (Pulmoanry): f/u sleep apnea-restart perforomist-Use yupelri in your nebulizer daily -previously seen by Dr. Lurena Joiner Tat with [facility]; advised her to contact neurology if her tremor progressf/u 4 months 07/02/21 Margaretmary Dys PharmD (Lipid clinic): LDL 208. Start Praluent 150 mg q14d. 06/04/21-[name],PA(Cardiology)-F/U CHF-Recent creatinine stable. Arrange fasting Lipids(abnormal)  Refer to [facility]id [facility]. Start ASA 81 mg once daily. 05/14/21 Dr Everardo All (Endocrine):  Establish care DM. Advised mealtime insulin, pt declined. Increased Tresiba to 120 units.  Hospital visits: None in previous 6 months   Objective:  Lab Results  Component Value Date   CREATININE 1.61 (H) 02/25/2022   BUN 27 02/25/2022   GFR 48.76 (L) 02/27/2021   EGFR 35 (L) 02/25/2022   GFRNONAA 48 (L) 03/31/2021   GFRAA 69 01/19/2020   NA 136 02/25/2022   K 4.2 02/25/2022   CALCIUM 10.1 02/25/2022   CO2 27 02/25/2022   GLUCOSE 509 (HH) 02/25/2022    Lab Results  Component Value Date/Time   HGBA1C 11.8 (A) 03/31/2022 09:20 AM   HGBA1C 7.6 (H) 11/07/2021 03:47 PM   HGBA1C 9.0 (A) 05/14/2021 03:10 PM   HGBA1C 10.9 (H) 02/14/2021 06:59 AM   GFR 48.76 (L) 02/27/2021 12:16 PM   GFR 38.07 (L) 11/28/2020 03:56 PM    Last diabetic Eye exam:  Lab Results  Component Value Date/Time   HMDIABEYEEXA No Retinopathy 12/22/2019 12:00 AM    Last diabetic Foot exam: No results found for: "HMDIABFOOTEX"   Lab Results  Component Value Date   CHOL 202 (H) 11/25/2021   HDL 47 11/25/2021   LDLCALC 105 (H)  11/25/2021   LDLDIRECT 96 02/25/2022   TRIG 297 (H) 11/25/2021   CHOLHDL 4.3 11/25/2021       Latest Ref Rng & Units 02/25/2022   10:31 AM 11/25/2021   12:58 PM 11/07/2021    3:47 PM  Hepatic Function  Total Protein 6.0 - 8.5 g/dL 6.7  7.2  6.8   Albumin 3.9 - 4.9 g/dL 4.2  4.6    AST 0 - 40 IU/L 16  21  18    ALT 0 - 32 IU/L 17  18  16    Alk Phosphatase 44 - 121 IU/L 87  98    Total Bilirubin 0.0 - 1.2 mg/dL 0.3  0.5  0.3     Lab Results  Component Value Date/Time   TSH 3.14 11/07/2021 03:47 PM   TSH 2.850 06/09/2019 12:50 PM       Latest Ref Rng & Units 02/25/2022   10:31 AM 11/07/2021    3:47 PM 03/31/2021    6:06 PM  CBC  WBC 3.4 - 10.8 x10E3/uL 9.9  10.5  9.7   Hemoglobin 11.1 - 15.9 g/dL 35.6  86.1  68.3   Hematocrit 34.0 - 46.6 % 44.7  40.5  41.6   Platelets 150 - 450 x10E3/uL 275  325  328     Lab Results  Component Value Date/Time   VD25OH 45.52 06/18/2020 04:52 PM   VD25OH 47.60 06/22/2019 01:59 PM   VITAMINB12 384 09/16/2008 04:20 AM    Clinical ASCVD: No  The 10-year ASCVD risk score (Arnett DK, et al., 2019) is: 18.2%   Values used to calculate the score:     Age: 26 years     Sex: Female     Is Non-Hispanic African American: No     Diabetic: Yes     Tobacco smoker: No     Systolic Blood Pressure: 129 mmHg     Is BP treated: Yes     HDL Cholesterol: 47 mg/dL     Total Cholesterol: 202 mg/dL       1/61/0960   45:40 PM 07/10/2021   11:14 AM 08/12/2020    1:54 PM  Depression screen PHQ 2/9  Decreased Interest 2 0 3  Down, Depressed, Hopeless 2 1 1   PHQ - 2 Score 4 1 4   Altered sleeping 3 2 2   Tired, decreased energy 3 1 3   Change in appetite 3 0 2  Feeling bad or failure about yourself  2 0 3  Trouble concentrating 2 0 3  Moving slowly or fidgety/restless 3 0 3  Suicidal thoughts 0 0 0  PHQ-9 Score 20 4 20   Difficult doing work/chores Extremely dIfficult Not difficult at all Somewhat difficult    Social History   Tobacco Use  Smoking  Status Never   Passive exposure: Past  Smokeless Tobacco Never   BP Readings from Last 3 Encounters:  05/26/22 129/78  05/22/22 104/60  03/31/22 (!) 148/76   Pulse Readings from Last 3 Encounters:  05/26/22 74  05/22/22 93  03/31/22 (!) 103   Wt Readings from Last 3 Encounters:  05/26/22 268 lb (121.6 kg)  05/22/22 268 lb (121.6 kg)  03/31/22 268 lb 6.4 oz (121.7 kg)   BMI Readings from Last 3 Encounters:  05/26/22 44.60 kg/m  05/22/22 44.60 kg/m  03/31/22 44.66 kg/m    Allergies  Allergen Reactions   Almond Oil Anaphylaxis, Shortness Of Breath and Swelling   Morphine And Related Shortness Of Breath and Other (See Comments)    Pt. States while in the hospital it affected her breathing, O2 dropped to the 70's   Atorvastatin Other (See Comments)    Leg weakness   Ceclor [Cefaclor] Nausea And Vomiting   Sulfa Antibiotics Nausea And Vomiting   Ciprofloxacin Other (See Comments)    Makes joints and muscles ache   Levaquin [Levofloxacin] Other (See Comments)    Body aches   Losartan Other (See Comments)    Weakness    Statins Other (See Comments)    Leg and body weakness    Medications Reviewed Today     Reviewed by Westley Foots, PT (Physical Therapist) on 06/09/22 at 1317  Med List Status: <None>   Medication Order Taking? Sig Documenting Provider Last Dose Status Informant  acetaminophen (TYLENOL) 500 MG tablet [id number] No Take 500 mg by mouth every 6 (six) hours as needed for moderate pain. [provider] Taking Active Self  albuterol (VENTOLIN HFA) 108 (90 Base) MCG/ACT inhaler [id number] No INHALE TWO PUFFS BY MOUTH INTO LUNGS every SIX hours AS NEEDED Joaquim Nam, MD Taking Active   aspirin EC 81 MG tablet [id number] No Take 1 tablet (81 mg total) by mouth  daily. Swallow whole. Tereso Newcomer T, PA-C Taking Active Self  BAYER CONTOUR NEXT TEST test strip [id number] No 1 each by Other route daily. As directed [provider] Taking  Active Self           Med Note Katrinka Blazing, JEFFREY W   Mon May 03, 2017 11:02 PM)    budesonide (PULMICORT) 0.5 MG/2ML nebulizer solution [id number] No Take 2 mLs (0.5 mg total) by nebulization 2 (two) times daily. Coralyn Helling, MD Taking Active Self  buPROPion (WELLBUTRIN XL) 300 MG 24 hr tablet [id number] No TAKE ONE TABLET BY MOUTH EVERY MORNING Joaquim Nam, MD Taking Active   Cholecalciferol (VITAMIN D-3) 5000 UNITS TABS [id number] No Take 5,000 Units by mouth every other day.  [provider] Taking Active Self  colchicine 0.6 MG tablet [id number] No TAKE 1 TABLET (0.6 MG TOTAL) BY MOUTH DAILY AS NEEDED (FOR GOUT) Joaquim Nam, MD Taking Active   Continuous Blood Gluc Receiver [facility] G7 RECEIVER) DEVI [id number] No by Does not apply route. [provider] Taking Active Self  Continuous Blood Gluc Sensor (DEXCOM G7 SENSOR) MISC [id number] No 1 Device by Does not apply route as directed. Apply sensor every 10 days Shamleffer, Konrad Dolores, MD Taking Active   dicyclomine (BENTYL) 20 MG tablet [id number] No TAKE 1 TABLET UP TO FOUR TIMES A DAY FOR GI CRAMPING, PAIN, NAUSEA, AND VOMITING AS NEEDED Joaquim Nam, MD Taking Active Self  Dulaglutide (TRULICITY) 4.5 MG/0.5ML SOPN [id number] No 4.5 mg by Other route once a week. Shamleffer, Konrad Dolores, MD Taking Active   DULoxetine (CYMBALTA) 60 MG capsule [id number] No TAKE ONE CAPSULE BY MOUTH EVERYDAY AT BEDTIME Joaquim Nam, MD Taking Active   estradiol (ESTRACE) 0.1 MG/GM vaginal cream [id number] No Place 1 Applicatorful vaginally 3 (three) times a week. [provider] Taking Active Self  FEROSUL 325 (65 Fe) MG tablet [id number] No TAKE ONE TABLET BY MOUTH every other evening Joaquim Nam, MD Taking Active   fluticasone [facility]) 50 MCG/ACT nasal spray [id number] No Place 2 sprays into both nostrils daily. Marita Kansas, PA-C Taking Active Self  formoterol (PERFOROMIST) 20 MCG/2ML nebulizer solution [id number] No  Take 2 mLs (20 mcg total) by nebulization 2 (two) times daily. Coralyn Helling, MD Taking Active Self  furosemide (LASIX) 20 MG tablet [id number] No Take 3 tablets (60 mg total) by mouth 2 (two) times daily. Tereso Newcomer T, PA-C Taking Active   gabapentin (NEURONTIN) 300 MG capsule [id number] No TAKE ONE CAPSULE BY MOUTH EVERYDAY AT BEDTIME May take additional capsule daily if needed Joaquim Nam, MD Taking Active   insulin aspart (NOVOLOG FLEXPEN) 100 UNIT/ML FlexPen [id number] No Max daily 45 units Shamleffer, Konrad Dolores, MD Taking Active   insulin degludec (TRESIBA FLEXTOUCH) 200 UNIT/ML FlexTouch Pen [id number] No Inject 70 Units into the skin daily. Shamleffer, Konrad Dolores, MD Taking Active   Insulin Pen Needle (GLOBAL EASE INJECT PEN NEEDLES) 32G X 4 MM MISC [id number] No 1 Device by Other route in the morning, at noon, in the evening, and at bedtime. Shamleffer, Konrad Dolores, MD Taking Active   ipratropium-albuterol (DUONEB) 0.5-2.5 (3) MG/3ML SOLN [id number] No Take 3 mLs by nebulization every 6 (six) hours as needed (wheezing). Coralyn Helling, MD Taking Active Self  ketoconazole (NIZORAL) 2 % cream [id number] No APPLY TO AFFECTED AREA TWICE A DAY Joaquim Nam, MD Taking Active   meloxicam (MOBIC) 7.5 MG tablet [id number] No Take 1 tablet (7.5 mg total) by mouth  daily as needed for pain. Joaquim Namuncan, Graham S, MD Taking Active   metolazone (ZAROXOLYN) 5 MG tablet [id number] No TAKE 1 TABLET BY MOUTH EVERY DAY AS NEEDED Joaquim Namuncan, Graham S, MD Taking Active   MICROLET LANCETS MISC [id number] No 1 each by Other route. As directed [provider] Taking Active Self           Med Note Sharlynn Oliphant(DALY, KATHY N   Mon Oct 21, 2015  3:31 PM)    nitrofurantoin (MACRODANTIN) 50 MG capsule [id number] No Take 50 mg by mouth daily. [provider] Taking Active Self           Med Note Para March(DUNCAN, Dwana CurdGRAHAM S   Fri Nov 07, 2021  2:27 PM)    nystatin (MYCOSTATIN/NYSTOP) powder [id number] No Apply 1 application  topically 3 (three) times daily. Joaquim Namuncan, Graham S, MD Taking Active Self  ondansetron [facility](ZOFRAN) 4 MG tablet [id number] No TAKE ONE TABLET BY MOUTH every EIGHT hours AS NEEDED FOR NAUSEA AND VOMITING Joaquim Namuncan, Graham S, MD Taking Active   OXYGEN [id number] No Inhale 2 L into the lungs continuous. [provider] Taking Active Self  pantoprazole (PROTONIX) 40 MG tablet [id number] No TAKE ONE TABLET BY MOUTH EVERY MORNING Joaquim Namuncan, Graham S, MD Taking Active   polyethylene glycol [facility](MIRALAX / GLYCOLAX) packet [id number] No Take 17 g by mouth daily as needed for mild constipation. [provider] Taking Active Self  potassium chloride (MICRO-K) 10 MEQ CR capsule [id number] No Take 5 capsules (50 mEq total) by mouth daily. Joaquim Namuncan, Graham S, MD Taking Active   primidone (MYSOLINE) 50 MG tablet [id number] No Take 0.25 tablets by mouth daily. [provider] Taking Active   revefenacin (YUPELRI) 175 MCG/3ML nebulizer solution [id number] No Inhale one vial in nebulizer once daily. Do not mix with other nebulized medications. Coralyn HellingSood, Vineet, MD Taking Active   rOPINIRole (REQUIP) 4 MG tablet [id number] No TAKE 0.5-1 TABLET BY MOUTH EVERY MORNING and TAKE 1 TABLET BY MOUTH EVERYDAY AT BEDTIME Joaquim Namuncan, Graham S, MD Taking Active   simethicone (GAS-X) 80 MG chewable tablet [id number] No Chew 1 tablet (80 mg total) by mouth every 6 (six) hours as needed for flatulence. Joaquim Namuncan, Graham S, MD Taking Active   spironolactone (ALDACTONE) 25 MG tablet [id number]  Take 0.5 tablets (12.5 mg total) by mouth daily. Joaquim Namuncan, Graham S, MD  Active   traMADol Janean Sark(ULTRAM) 50 MG tablet [id number] No Take 1 tablet (50 mg total) by mouth 3 (three) times daily as needed. Joaquim Namuncan, Graham S, MD Taking Active   UNABLE TO FIND [id number] No CPAP- At bedtime [provider] Taking Active Self            Patient Active Problem List   Diagnosis Date Noted   Diabetes mellitus 03/31/2022   Myalgia 02/25/2022   Prolonged Q-T  interval on ECG 02/25/2022   Aortic atherosclerosis 06/04/2021   Preoperative cardiovascular examination 06/04/2021   Hyperglycemia 02/14/2021   Rash 09/27/2020   COPD exacerbation 09/21/2020   Acute exacerbation of COPD with asthma 09/19/2020   Mixed diabetic hyperlipidemia associated with type 2 diabetes mellitus 09/19/2020   Pain of foot 06/20/2020   Medicare annual wellness visit, initial 06/20/2020   Constipation 06/11/2020   Diverticulosis of colon 06/11/2020   Irritable bowel syndrome 06/11/2020   Periumbilical pain 06/11/2020   RLS (restless legs syndrome)    Asthma exacerbation 01/08/2020   Muscle weakness 01/08/2020   Grade I diastolic dysfunction 01/08/2020   Drug-induced myopathy 10/10/2019   Advance care  planning 06/14/2019   RLQ abdominal pain 06/14/2019   COPD (chronic obstructive pulmonary disease) 06/14/2019   Leukocytosis 06/14/2019   Hypercalcemia 06/14/2019   Gout 06/14/2019   Type 2 diabetes mellitus with hyperglycemia, without long-term current use of insulin    COPD with acute exacerbation 05/03/2017   Diabetes mellitus without complication    CKD (chronic kidney disease) stage 3, GFR 30-59 ml/min    Hypersomnia with sleep apnea 09/16/2015   Fatigue 06/07/2015   Morbid obesity 06/07/2015   Cough 04/18/2015   (HFpEF) heart failure with preserved ejection fraction    Pneumonia of both lower lobes due to infectious organism 02/09/2013   OSA on CPAP 08/23/2012   Essential hypertension    Depression    GERD without esophagitis    Hyperlipidemia    Ulcerative colitis (HCC)    Patellar tendinitis 05/25/2011   Knee pain 05/25/2011   Myofascial pain 05/25/2011   Cervicalgia 05/25/2011    Immunization History  Administered Date(s) Administered   Fluad Quad(high Dose 65+) 12/31/2021   Influenza Split 12/01/2012, 01/18/2017   Influenza Whole 12/25/2011   Influenza,inj,Quad PF,6+ Mos 11/27/2013, 12/08/2014, 12/11/2015   Influenza-Unspecified 12/07/2017    Janssen (J&J) SARS-COV-2 Vaccination 06/15/2019   Pneumococcal Conjugate-13 02/17/2017   Pneumococcal Polysaccharide-23 11/27/2013   Tdap 04/01/2010   Zoster Recombinat (Shingrix) 10/06/2021, 12/31/2021    SDOH:  (Social Determinants of Health) assessments and interventions performed: No SDOH Interventions    Flowsheet Row Office Visit from 05/22/2022 in [facility] Clinical Support from 07/10/2021 in [facility] Chronic Care Management from 09/02/2020 in [facility] Chronic Care Management from 08/12/2020 in [facility] Chronic Care Management from 07/29/2020 in [facility] [place] [facility] OTHER RESPIRATORY from 12/28/2017 in [facility]  SDOH Interventions        Food Insecurity Interventions -- Intervention Not Indicated -- Intervention Not Indicated -- --  Housing Interventions -- Intervention Not Indicated -- -- -- --  Transportation Interventions -- Intervention Not Indicated -- Intervention Not Indicated -- --  Depression Interventions/Treatment  Counseling -- -- Medication -- Counseling, Medication, Currently on Treatment  Financial Strain Interventions -- Intervention Not Indicated Intervention Not Indicated -- Intervention Not Indicated --  Physical Activity Interventions -- Intervention Not Indicated -- Other (Comments)  [Discussed  chair exercises.  Exercise booklet mailed to patient.] -- --  Stress Interventions -- Intervention Not Indicated -- -- -- --  Social Connections Interventions -- Intervention Not Indicated -- -- -- --      SDOH Screenings   Food Insecurity: No Food Insecurity (07/10/2021)  Housing: Low Risk  (07/10/2021)  Transportation Needs: No Transportation Needs (07/10/2021)  Alcohol Screen: Low Risk  (07/10/2021)  Depression (PHQ2-9): High  Risk (05/22/2022)  Financial Resource Strain: Low Risk  (07/10/2021)  Physical Activity: Inactive (07/10/2021)  Social Connections: Moderately Isolated (07/10/2021)  Stress: No Stress Concern Present (07/10/2021)  Tobacco Use: Low Risk  (06/09/2022)    Medication Assistance: None required.  Patient affirms current coverage meets needs. - LIS  Medication Access: Within the past 30 days, how often has patient missed a dose of medication? 0 Is a pillbox or other method used to improve adherence? Yes  Factors that may affect medication adherence? no barriers identified Are meds synced by current pharmacy? Yes  Are meds delivered by current pharmacy? Yes  Does patient experience delays in picking up medications due to transportation concerns? No   [facility] Reviewed: Is patient disadvantaged to use [facility]?: No  Current Rx insurance plan: [employer] Name and location of Current pharmacy:  [facility] [phone] - [place], [place] - [address] [place] [address] Phone: [phone] Fax: [phone]  [facility] - [place], [place] - [address] [place] [address] Phone: [phone] Fax: [phone]  [facility] - [place], [place] - [address] [place] [address] Phone: [phone] Fax: [phone]  [facility] services reviewed with patient today?: Yes  30 day pill packs: Delivered 06/08/22 Bupropion HCL XL 300mg - take 1 tablet at evening meal Cholecalciferol (vit D3)-5000units- take 1 tablet breakfast every other day  Ropinirole 4mg - take 1 tablet breakfast 1 tablet bedtime  Pantoprazole 40mg - take 1 tablet bedtime Spironolactone 25mg - take 1 tablet breakfast Furosemide 20mg  take 3 tablets breakfast and dinner Duloxetine 60mg - take 1 tablet breakfast Ferrous Sulfate 325mg - take 1 tablet evening meal every other day  Gabapentin 300mg -take 1 tablet  bedtime Aspirin 81mg  -take 1 tablet at breakfast  VIAL medications: Albuterol  108 mcg/act inhaler- 2 puffs into lungs every 6 hours as needed Gabapentin 300mg -take extra tablet as needed at bedtime   Patient declined the following medications this month: Cetirizine, gabapentin (extras)  Compliance/Adherence/Medication fill history: Care Gaps: DEXA (never done) Foot exam (11/28/21) Eye exam (12/21/21) UACR (never done)  Star-Rating Drugs: Trulicity - PDC 94%    ASSESSMENT / PLAN:  Hyperlipidemia: (LDL goal < 70) -Improved - LDL 96 (02/2022), improved from 208 (06/2021) with Praluent; TRIG 297 improved from > 500 -Pt was recently switched from Praluent to Repatha due to insurance coverage, she could not tolerate Repatha, recently stopped due to side effects -High ASCVD risk (25%) - reduced to 16% with improvement in cholesterol -Current treatment: Repatha 150 mg q 14 days - on hold -Medications previously tried: Multiple statins (atorvastatin, others) - leg weakness; Vascepa (cost), fenofibrate  -Educated on Cholesterol goals; -Discussed switching back to Praluent - now that she has failed [employer] may cover it. Submitted PA to [facility] - Key: [id number] -Recommend to switch Repatha to Praluent 150 mg - awaiting insurance approval  Diabetes (A1c goal <7%) -Uncontrolled- A1c 11.8% (03/2022) worsened from 7.6%; pt is interested in switching to Ozempic -Followed with Dr Lonzo Cloud (established care 03/2022) -Dx 2017, insulin since 2020. Hx of chronic UTIs, would avoid SGLT2-inhibitor -Current home glucose readings: via Dexcom G7; pt reports alarms have not been going off much lately  -glucose 200s-300s most of the time, she can get it down to 172 -Current medications: Trulicity 4.5 mg weekly (Thurs) - Appropriate, Query Effective Tresiba 70 units daily (midday) -Appropriate, Query Effective Novolog sliding scale AC - typically 2-3 units with meals Dexcom G7 w/ reader -  Appropriate, Effective, Safe, Accessible -Medications previously tried: Jardiance, Victoza, metformin -Current meal patterns: The patient reports she is on a salt free, sugar free, gluten free diet but had a hard time sticking to this. She tries to limit portions.  -Discussed importance of compliance with insulin; she may be a candidate for Mounjaro, but supply issues limit this right now; it would be reasonable to switch to Ozempic, discussed differences between Trulicity and Ozempic device, pt has history of intolerance to changing medication so would recommend to start Ozempic at lower than equivalent dose  -Recommend to stop Trulicity and start  Ozempic 1 mg weekly - consulting with endocrine  Heart Failure (Goal: manage symptoms and prevent exacerbations) -Query controlled - pt reports swelling, some slight shortness of breath; weight is up to 269#; she has not taken metolazone this week, typically takes it 1-2 times per week -Last BP: "good" after cutting spiro in half -Daily weight: pt reports dry wt 255#, admits she has not seen that in "a while"; per review in office weight has been between 260-265# for the last year; pt reports wt today was 269#, likely need to establish new dry weight -Last ejection fraction: 70% (Date: 05/2019) -HF type: HFpEF (EF > 50%) -NYHA Class: III (marked limitation of activity) -Followed by Dr. Excell Seltzer -Current treatment: Spironolactone 25 mg - 1/2 tab daily AM - Appropriate, Effective, Safe, Accessible Furosemide 20 mg - 3 tab BID- Appropriate, Effective, Safe, Accessible Potassium 10 mEq - 5 tab daily- Appropriate, Effective, Safe, Accessible Metolazone 2.5 mg PRN wt gain - Appropriate, Effective, Query Safe,  -Medications previously tried: torsemide, bumetanide, losartan  -Reviewed concept of "dry weight" and use of metolazone as "diuretic booster" when weight is > 3lbs above dry weight; likely need to establish new dry weight as patient has not been 255# in  over a year; given ongoing 1-2x weekly use of metolazone and recent spironolactone dose change recommend to check BMP as now > 3 months since last labwork -Recommended to continue current medication  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) -Pt currently being treated for COPD exacerbation/COVID -she wears Oxygen at night and PRN during the day;  -Followed by Dr Craige Cotta, [facility]. Pt does not follow up with pulmonology as frequently as recommended.  -Pulmonary function testing: 2021 FEV1 42% predicted -Gold Grade: Gold 3 (FEV1 30-49%) -Current COPD Classification:  E (high sx, >2 exacerbations/yr) -Exacerbations requiring treatment in last 6 months: yes, multiple -Compliant with CPAP nightly -Current regimen: Budesonide nebuilzer BID - Appropriate, Effective, Safe, Accessible Yupelri (revefenacin) nebulizer daily -Appropriate, Effective, Safe, Accessible Albuterol/ipratropium nebulizer - takes 2-3 times per day as needed - Appropriate, Effective, Safe, Accessible Albuterol inhaler - 2 puffs every 6 hours PRN - Appropriate, Effective, Safe, Accessible -Medications previously tried: Performist (cost), Advair, Incruse, Trelegy (pt does not like powder inhalers) -Recommended to continue current medication; follow up with pulmonology more regularly  Depression/Anxiety/Pain(Goal: Control symptoms) -Controlled, stable per patient -Current treatment: Bupropion 300 mg daily - Appropriate, Effective, Safe, Accessible Duloxetine 60 mg daily - Appropriate, Effective, Safe, Accessible Gabapentin 300 mg HS - Appropriate, Effective, Safe, Accessible -Medications previously tried/failed: none -Concern that ropinirole may be causing her to fall asleep suddenly during the day. This has been discussed with patient before and per chart records, she was unable to tolerate dose reductions. Most recently attempted taper 11/2021 per neurology instructions -Recommend to  continue current medication  RLS / tremor (Goal: manage symptoms) -Follows with neurology; recently referred to PT for tremor; pt reports she takes less primidone than prescribed because of side effects (1/4 tablet PRN) -Pt was advised to taper ropinirole to lower dose in 11/2021 per neurlogy (current dose is Parkinson's dose, RLS dose is lower) -Taper plan per neuro: 2 mg/4mg  x 2 weeks, 2mg  BID x 2 weeks, 2 mg HS -Current treatment  Ropinirole 4 mg BID (RLS) - Appropriate, Effective, Query Safe Gabapentin 100 mg - 3 caps HS - Appropriate, Effective, Safe, Accessible -Medications previously tried: primidone -Reviewed taper plan for ropinirole with patient -Recommended to continue current medication; Advised to attend PT sessions  as scheduled   Al CorpusLindsey Chrishauna Mee, PharmD, BCACP Clinical Pharmacist [facility] [phone]

## 2022-06-12 NOTE — Telephone Encounter (Signed)
Discussed heart failure with patinet - pt reports swelling, some slight shortness of breath; she has not taken metolazone this week, typically takes it 1-2 times per week; weight is up to 269 lbs (dry weight unclear - pt reports it is 255 lbs however she has not seen that number in over a year, weight was 268 lbs in last 3 office visits, likely need to re-establish dry weight). She reports BP has been "good" since cutting spironolactone in half, she is not able to provide readings.  Advised pt to take metolazone as directed - for weight gain > 3 lbs above dry weight. Advised to try limit metolazone to twice a week to avoid kidney injury.   Given ongoing 1-2x weekly use of metolazone and recent spironolactone dose change, recommend to check BMP as now > 3 months since last labwork.  Routing to PCP for input.  Wt Readings from Last 3 Encounters:  05/26/22 268 lb (121.6 kg)  05/22/22 268 lb (121.6 kg)  03/31/22 268 lb 6.4 oz (121.7 kg)      Latest Ref Rng & Units 02/25/2022   10:31 AM 12/04/2021   11:43 AM 11/25/2021   12:58 PM  BMP  Glucose 70 - 99 mg/dL 638  177  116   BUN 8 - 27 mg/dL 27  32  18   Creatinine 0.57 - 1.00 mg/dL 5.79  0.38  3.33   BUN/Creat Ratio 12 - 28 17  21  13    Sodium 134 - 144 mmol/L 136  138  141   Potassium 3.5 - 5.2 mmol/L 4.2  3.7  4.0   Chloride 96 - 106 mmol/L 93  93  98   CO2 20 - 29 mmol/L 27  28  26    Calcium 8.7 - 10.3 mg/dL 83.2  91.9  16.6

## 2022-06-12 NOTE — Patient Instructions (Signed)
Visit Information  Phone number for Pharmacist: [phone]  Thank you for meeting with me to discuss your medications! Below is a summary of what we talked about during the visit:   Recommendations/Changes made from today's visit: -Recommend to switch Trulicity to Ozempic 1 mg (lower than equivalent dose of Ozempic given patient's history of multiple medication intolerances) - consulting with endocrine -Recommend to switch Repatha back to Praluent 150 mg - Submitted PA for Praluent now that patient has failed Repatha (CMM key: [id number]) -Recommend BMP given ongoing 1-2x weekly metolazone use, spironolactone dose change - consulting with PCP  Follow up plan: -Health Concierge will call patient 1 month for medication coordination -Pharmacist follow up televisit scheduled for 3 months -Endocrine appt 10/06/22; Neurology appt 11/26/22   Al Corpus, PharmD, BCACP Clinical Pharmacist [facility] [phone]

## 2022-06-14 NOTE — Telephone Encounter (Signed)
Please see about getting patient an office visit so we can recheck her and do labs at the visit.  Thanks.

## 2022-06-15 NOTE — Telephone Encounter (Signed)
Called patient to schedule.She was asleep.she will call back to schedule appointment with Dr Para March.

## 2022-06-16 ENCOUNTER — Encounter: Payer: Self-pay | Admitting: Physical Therapy

## 2022-06-16 ENCOUNTER — Telehealth: Payer: Self-pay | Admitting: Family Medicine

## 2022-06-16 ENCOUNTER — Ambulatory Visit: Payer: Medicare HMO | Admitting: Physical Therapy

## 2022-06-16 DIAGNOSIS — R278 Other lack of coordination: Secondary | ICD-10-CM

## 2022-06-16 DIAGNOSIS — R2681 Unsteadiness on feet: Secondary | ICD-10-CM

## 2022-06-16 DIAGNOSIS — R262 Difficulty in walking, not elsewhere classified: Secondary | ICD-10-CM | POA: Diagnosis not present

## 2022-06-16 DIAGNOSIS — G2581 Restless legs syndrome: Secondary | ICD-10-CM | POA: Diagnosis not present

## 2022-06-16 DIAGNOSIS — R251 Tremor, unspecified: Secondary | ICD-10-CM | POA: Diagnosis not present

## 2022-06-16 NOTE — Therapy (Unsigned)
OUTPATIENT PHYSICAL THERAPY NEURO TREATMENT   Patient Name: Kathleen Hatfield MRN: [id number] DOB:[birth year], 67 y.o., female Today's Date: 06/16/2022   PCP: Crawford Givens, MD REFERRING PROVIDER: Marcos Eke, PA-C   END OF SESSION:  PT End of Session - 06/16/22 1321     Visit Number 2    Number of Visits 13    Date for PT Re-Evaluation 08/04/22    Authorization Type [employer] medicare    PT Start Time 1315    PT Stop Time 1357    PT Time Calculation (min) 42 min    Equipment Utilized During Treatment Oxygen   2L via nasal cannula; full size tank around 1500 PSI   Activity Tolerance Patient limited by fatigue    Behavior During Therapy [facility] for tasks assessed/performed             Past Medical History:  Diagnosis Date   Anemia    Arthritis    Asthma    Chronic diastolic CHF 05/2014   Echocardiogram 05/2019: EF 70, no RWMA, mild LVH, Gr 1 DD, normal RVSF, severe LVH, borderline asc Aorta (39 mm)   CKD (chronic kidney disease)    Depression    Diabetes mellitus without complication    Diverticulitis    Dyspnea    with exertion   GERD (gastroesophageal reflux disease)    History of blood transfusion    Hyperlipidemia    cannot tolerate statins   Hypertension    patient states she has never had high blood pressure.    Nuclear stress test    Myoview 05/2019: EF 83, no ischemia or infarction; Low Risk   Peripheral neuropathy    Pneumonia    PONV (postoperative nausea and vomiting)    RLS (restless legs syndrome)    Sleep apnea    uses CPAP   Ulcerative colitis    dr [address]   Past Surgical History:  Procedure Laterality Date   ABDOMINAL HYSTERECTOMY     APPENDECTOMY     BIOPSY  10/10/2021   Procedure: BIOPSY;  Surgeon: Jeani Hawking, MD;  Location: [facility]OS[facility];  Service: Gastroenterology;;   BREAST BIOPSY Left    BREAST SURGERY     left biopsy   CARDIAC CATHETERIZATION     CESAREAN SECTION     CHOLECYSTECTOMY     COLONOSCOPY WITH PROPOFOL N/A 10/10/2021   Procedure:  COLONOSCOPY WITH PROPOFOL;  Surgeon: Jeani Hawking, MD;  Location: [facility];  Service: Gastroenterology;  Laterality: N/A;   HERNIA REPAIR     INSERTION OF MESH N/A 11/15/2015   Procedure: INSERTION OF MESH;  Surgeon: Karie Soda, MD;  Location: [facility] ORS;  Service: General;  Laterality: N/A;   LAPAROSCOPIC LYSIS OF ADHESIONS N/A 11/15/2015   Procedure: LAPAROSCOPIC LYSIS OF ADHESIONS;  Surgeon: Karie Soda, MD;  Location: [facility] ORS;  Service: General;  Laterality: N/A;   RIGHT HEART CATHETERIZATION N/A 02/27/2013   Procedure: RIGHT HEART CATH;  Surgeon: Micheline Chapman, MD;  Location: [facility] CATH LAB;  Service: Cardiovascular;  Laterality: N/A;   RIGHT HEART CATHETERIZATION N/A 12/14/2013   Procedure: RIGHT HEART CATH;  Surgeon: Laurey Morale, MD;  Location: [facility] CATH LAB;  Service: Cardiovascular;  Laterality: N/A;   SIGMOIDECTOMY  2010   diverticular disease   VENTRAL HERNIA REPAIR N/A 11/15/2015   Procedure: LAPAROSCOPIC VENTRAL WALL HERNIA REPAIR;  Surgeon: Karie Soda, MD;  Location: [facility] ORS;  Service: General;  Laterality: N/A;   Patient Active Problem List   Diagnosis Date Noted  Diabetes mellitus 03/31/2022   Myalgia 02/25/2022   Prolonged Q-T interval on ECG 02/25/2022   Aortic atherosclerosis 06/04/2021   Preoperative cardiovascular examination 06/04/2021   Hyperglycemia 02/14/2021   Rash 09/27/2020   COPD exacerbation 09/21/2020   Acute exacerbation of COPD with asthma 09/19/2020   Mixed diabetic hyperlipidemia associated with type 2 diabetes mellitus 09/19/2020   Pain of foot 06/20/2020   Medicare annual wellness visit, initial 06/20/2020   Constipation 06/11/2020   Diverticulosis of colon 06/11/2020   Irritable bowel syndrome 06/11/2020   Periumbilical pain 06/11/2020   RLS (restless legs syndrome)    Asthma exacerbation 01/08/2020   Muscle weakness 01/08/2020   Grade I diastolic dysfunction 01/08/2020   Drug-induced myopathy 10/10/2019   Advance care planning  06/14/2019   RLQ abdominal pain 06/14/2019   COPD (chronic obstructive pulmonary disease) 06/14/2019   Leukocytosis 06/14/2019   Hypercalcemia 06/14/2019   Gout 06/14/2019   Type 2 diabetes mellitus with hyperglycemia, without long-term current use of insulin    COPD with acute exacerbation 05/03/2017   Diabetes mellitus without complication    CKD (chronic kidney disease) stage 3, GFR 30-59 ml/min    Hypersomnia with sleep apnea 09/16/2015   Fatigue 06/07/2015   Morbid obesity 06/07/2015   Cough 04/18/2015   (HFpEF) heart failure with preserved ejection fraction    Pneumonia of both lower lobes due to infectious organism 02/09/2013   OSA on CPAP 08/23/2012   Essential hypertension    Depression    GERD without esophagitis    Hyperlipidemia    Ulcerative colitis (HCC)    Patellar tendinitis 05/25/2011   Knee pain 05/25/2011   Myofascial pain 05/25/2011   Cervicalgia 05/25/2011    ONSET DATE:   05/26/2022  referral  REFERRING DIAG:  R25.1 (ICD-10-CM) - Tremor  G25.81 (ICD-10-CM) - Restless leg    THERAPY DIAG:  Difficulty in walking, not elsewhere classified  Other lack of coordination  Unsteadiness on feet  Rationale for Evaluation and Treatment: Rehabilitation  SUBJECTIVE:                                                                                                                                                                                             SUBJECTIVE STATEMENT: Patient ambulates into clinic with PT managing O2 tank.  She requires single standing rest break due to generalized fatigue and dyspnea.  Her SpO2 is 97% and HR is 105 bpm. Pt accompanied by: self  PERTINENT HISTORY: asthma, CHF, OSA, HTN, HLD, CKD, GERD, DM, RLS  PAIN:  Are you having pain? No  PRECAUTIONS: Fall  WEIGHT BEARING RESTRICTIONS: No  FALLS:  Has patient fallen in last 6 months? Yes. Number of falls 1; fell off toilet leaning too far forward  LIVING  ENVIRONMENT: Lives with: lives with their spouse Lives in: House/apartment Stairs: Yes: Internal: a few steps; none Has following equipment at home: Environmental consultant - 2 wheeled and Wheelchair (manual)  PLOF: Requires assistive device for independence  PATIENT GOALS: "to work on my strength and balance"  OBJECTIVE:   COGNITION: Overall cognitive status: Within functional limits for tasks assessed   SENSATION: WFL  COORDINATION: Impaired B LE with intention tremors noted  POSTURE: rounded shoulders, forward head, increased thoracic kyphosis, posterior pelvic tilt, and flexed trunk   LOWER EXTREMITY MMT:   Grossly 4/5  BED MOBILITY:  Reports only mild difficulty   TRANSFERS: Assistive device utilized: Environmental consultant - 2 wheeled  Sit to stand: Modified independence Stand to sit: Modified independence Chair to chair: Modified independence   GAIT: Gait pattern: step through pattern, decreased stride length, shuffling, trunk flexed, and wide BOS Distance walked: clinic Assistive device utilized: Environmental consultant - 2 wheeled Level of assistance: SBA  FUNCTIONAL TESTS:   [facility] PT Assessment - 06/16/22 1324       Standardized Balance Assessment   Standardized Balance Assessment Berg Balance Test      Berg Balance Test   Sit to Stand Able to stand  independently using hands    Standing Unsupported Able to stand 30 seconds unsupported    Sitting with Back Unsupported but Feet Supported on Floor or Stool Able to sit safely and securely 2 minutes    Stand to Sit Controls descent by using hands    Transfers Able to transfer safely, definite need of hands    Standing Unsupported with Eyes Closed Able to stand 10 seconds with supervision    Standing Unsupported with Feet Together Needs help to attain position but able to stand for 30 seconds with feet together    From Standing, Reach Forward with Outstretched Arm Can reach forward >12 cm safely (5")    From Standing Position, Pick up Object from Floor  Unable to try/needs assist to keep balance    From Standing Position, Turn to Look Behind Over each Shoulder Turn sideways only but maintains balance    Turn 360 Degrees Needs assistance while turning    Standing Unsupported, Alternately Place Feet on Step/Stool Able to complete >2 steps/needs minimal assist    Standing Unsupported, One Foot in Front Needs help to step but can hold 15 seconds    Standing on One Leg Unable to try or needs assist to prevent fall    Total Score 26    Berg comment: 26/56 = significant fall risk             PATIENT SURVEYS:  ABC scale 10%  TODAY'S TREATMENT:                                                                                                                              -  BERG:  [facility] - 06/16/22 1324       Standardized Balance Assessment   Standardized Balance Assessment Berg Balance Test      Berg Balance Test   Sit to Stand Able to stand  independently using hands    Standing Unsupported Able to stand 30 seconds unsupported    Sitting with Back Unsupported but Feet Supported on Floor or Stool Able to sit safely and securely 2 minutes    Stand to Sit Controls descent by using hands    Transfers Able to transfer safely, definite need of hands    Standing Unsupported with Eyes Closed Able to stand 10 seconds with supervision    Standing Unsupported with Feet Together Needs help to attain position but able to stand for 30 seconds with feet together    From Standing, Reach Forward with Outstretched Arm Can reach forward >12 cm safely (5")    From Standing Position, Pick up Object from Floor Unable to try/needs assist to keep balance    From Standing Position, Turn to Look Behind Over each Shoulder Turn sideways only but maintains balance    Turn 360 Degrees Needs assistance while turning    Standing Unsupported, Alternately Place Feet on Step/Stool Able to complete >2 steps/needs minimal assist    Standing Unsupported, One Foot  in Front Needs help to step but can hold 15 seconds    Standing on One Leg Unable to try or needs assist to prevent fall    Total Score 26    Berg comment: 26/56 = significant fall risk           Pt requires seated rest between every 2-3 tasks.  SpO2 95%, HR 110bpm following.  -10MWT w/ RW:  43.22 seconds = 0.23 m/sec OR 0.76 ft/sec Pt nauseous at end of session, SpO2 96% and HR 106 bpm.  Nausea subsided by the time patient exited the clinic.  PATIENT EDUCATION: Education details: Outcome interpretations and establishing HEP at next session due to time constraints.  Discussed O2 tank management and ensuring tank is well away from empty prior to PT. Person educated: Patient Education method: Explanation Education comprehension: verbalized understanding and needs further education  HOME EXERCISE PROGRAM: To be provided  GOALS: Goals reviewed with patient? Yes  SHORT TERM GOALS: Target date: 06/30/22  Pt will be independent with initial HEP for improved strength and endurance  Baseline: to be provided Goal status: INITIAL  2.  Pt will improve 5x STS to </= 22 sec to demo improved functional LE strength and balance   Baseline: 27.53s B UE Goal status: INITIAL  3.  Pt will demonstrate a gait speed of >/=1.06 feet/sec in order to decrease risk for falls. Baseline: 0.76 ft/sec Goal status: INITIAL  4.  Pt will increase BERG balance score to >/=31/56 to demonstrate improved static balance. Baseline: 26/56 (4/10) Goal status: INITIAL  5.  Tug goal Baseline: to be assessed Goal status: INITIAL  6.  Patient will improve ABC score to >/= 20% to demonstrate improved confidence with functional mobility Baseline: 10% Goal status: INITIAL  LONG TERM GOALS: Target date: 08/04/22  Pt will be independent with final HEP for improved strength and endurance  Baseline: to be provided Goal status: INITIAL  2.  Pt will improve 5x STS to </= 17 sec to demo improved functional LE  strength and balance   Baseline: 27.53s B UE Goal status: INITIAL  3.  Tug goal Baseline: to be assessed Goal status: INITIAL  4.  Pt will demonstrate a gait speed of >/=1.36 feet/sec in order to decrease risk for falls. Baseline: 0.76 ft/sec Goal status: INITIAL  5.  Pt will increase BERG balance score to >/=36/56 to demonstrate improved static balance. Baseline: 26/56 (4/10) Goal status: INITIAL  6.  Patient will improve ABC score to >/= 30% to demonstrate improved confidence with mobility Baseline: 10% Goal status: INITIAL  ASSESSMENT:  CLINICAL IMPRESSION: Emphasis of skilled session on collecting objective data points unable to obtain in initial evaluation due to ongoing patient fatigue and deconditioning.  She requires significant seated rest between tasks this session with stability of SpO2 and HR noted throughout session (SpO2 95-97%, HR 105-110 bpm).  Her BERG score was 26/56 and she ambulates w/ a RW at a speed of 0.76 ft/sec both indicating severe fall risk.  She continues to benefit from skilled PT to complete assessment unable to tolerate today and establish an initial HEP to promote generalized activity tolerance and mobility.  OBJECTIVE IMPAIRMENTS: Abnormal gait, cardiopulmonary status limiting activity, decreased activity tolerance, decreased balance, decreased endurance, decreased knowledge of condition, decreased knowledge of use of DME, decreased mobility, difficulty walking, decreased strength, improper body mechanics, postural dysfunction, and pain.   ACTIVITY LIMITATIONS: carrying, lifting, standing, squatting, stairs, transfers, locomotion level, and caring for others  PARTICIPATION LIMITATIONS: cleaning, interpersonal relationship, driving, shopping, community activity, occupation, and yard work  PERSONAL FACTORS: Age, Fitness, Past/current experiences, Time since onset of injury/illness/exacerbation, and 3+ comorbidities: see above  are also affecting  patient's functional outcome.   REHAB POTENTIAL: Fair polypharmacy, time since onset  CLINICAL DECISION MAKING: Evolving/moderate complexity  EVALUATION COMPLEXITY: Moderate  PLAN:  PT FREQUENCY: 2x/week  PT DURATION: 6 weeks  PLANNED INTERVENTIONS: Therapeutic exercises, Therapeutic activity, Neuromuscular re-education, Balance training, Gait training, Patient/Family education, Self Care, Joint mobilization, Stair training, Vestibular training, Orthotic/Fit training, DME instructions, Aquatic Therapy, Taping, Manual therapy, and Re-evaluation  PLAN FOR NEXT SESSION: HEP, TUG   Sadie Haber, PT, DPT 06/16/2022, 1:58 PM

## 2022-06-16 NOTE — Telephone Encounter (Signed)
Patient returned call to our office to schedule an appointment, was not sure who called her. May be related to lab orders placed on 4/7 but unsure. Patient asked if she could have her potassium level checked, informed her it would have to be ordered by Dr. Para March. Let patient know I would send a message back based on this request. If lab appointment is needed  for orders already placed please let me know so we can schedule her. If needed, advise [phone].

## 2022-06-17 NOTE — Telephone Encounter (Signed)
Was able to get patient scheduled for f/u and labs.

## 2022-06-17 NOTE — Telephone Encounter (Signed)
Patient was supposed to schedule an OV with Dr. Para March and will be doing labs at the visit.

## 2022-06-17 NOTE — Telephone Encounter (Signed)
Spoke to pt's husband. Pt's husband stated he'd get pt to call office back to scheduled ov

## 2022-06-18 ENCOUNTER — Encounter: Payer: Self-pay | Admitting: Physical Therapy

## 2022-06-18 ENCOUNTER — Ambulatory Visit: Payer: Medicare HMO | Admitting: Physical Therapy

## 2022-06-18 DIAGNOSIS — R2681 Unsteadiness on feet: Secondary | ICD-10-CM

## 2022-06-18 DIAGNOSIS — R278 Other lack of coordination: Secondary | ICD-10-CM | POA: Diagnosis not present

## 2022-06-18 DIAGNOSIS — R251 Tremor, unspecified: Secondary | ICD-10-CM | POA: Diagnosis not present

## 2022-06-18 DIAGNOSIS — R262 Difficulty in walking, not elsewhere classified: Secondary | ICD-10-CM

## 2022-06-18 DIAGNOSIS — G2581 Restless legs syndrome: Secondary | ICD-10-CM | POA: Diagnosis not present

## 2022-06-18 NOTE — Therapy (Signed)
OUTPATIENT PHYSICAL THERAPY NEURO TREATMENT   Patient Name: GENESE HIBBETT MRN: [id number] DOB:1955/10/02, 67 y.o., female Today's Date: 06/18/2022   PCP: Crawford Givens, MD REFERRING PROVIDER: Marcos Eke, PA-C   END OF SESSION:  PT End of Session - 06/18/22 1410     Visit Number 3    Number of Visits 13    Date for PT Re-Evaluation 08/04/22    Authorization Type [employer] medicare    PT Start Time 1406    PT Stop Time 1447    PT Time Calculation (min) 41 min    Equipment Utilized During Treatment Oxygen;Gait belt   2L via nasal cannula; full size tank around 2000 PSI   Activity Tolerance Patient limited by fatigue    Behavior During Therapy [facility] for tasks assessed/performed             Past Medical History:  Diagnosis Date   Anemia    Arthritis    Asthma    Chronic diastolic CHF 05/2014   Echocardiogram 05/2019: EF 70, no RWMA, mild LVH, Gr 1 DD, normal RVSF, severe LVH, borderline asc Aorta (39 mm)   CKD (chronic kidney disease)    Depression    Diabetes mellitus without complication    Diverticulitis    Dyspnea    with exertion   GERD (gastroesophageal reflux disease)    History of blood transfusion    Hyperlipidemia    cannot tolerate statins   Hypertension    patient states she has never had high blood pressure.    Nuclear stress test    Myoview 05/2019: EF 83, no ischemia or infarction; Low Risk   Peripheral neuropathy    Pneumonia    PONV (postoperative nausea and vomiting)    RLS (restless legs syndrome)    Sleep apnea    uses CPAP   Ulcerative colitis    dr [address]   Past Surgical History:  Procedure Laterality Date   ABDOMINAL HYSTERECTOMY     APPENDECTOMY     BIOPSY  10/10/2021   Procedure: BIOPSY;  Surgeon: Jeani Hawking, MD;  Location: [facility];  Service: Gastroenterology;;   BREAST BIOPSY Left    BREAST SURGERY     left biopsy   CARDIAC CATHETERIZATION     CESAREAN SECTION     CHOLECYSTECTOMY     COLONOSCOPY WITH PROPOFOL N/A 10/10/2021    Procedure: COLONOSCOPY WITH PROPOFOL;  Surgeon: Jeani Hawking, MD;  Location: [facility];  Service: Gastroenterology;  Laterality: N/A;   HERNIA REPAIR     INSERTION OF MESH N/A 11/15/2015   Procedure: INSERTION OF MESH;  Surgeon: Karie Soda, MD;  Location: [facility];  Service: General;  Laterality: N/A;   LAPAROSCOPIC LYSIS OF ADHESIONS N/A 11/15/2015   Procedure: LAPAROSCOPIC LYSIS OF ADHESIONS;  Surgeon: Karie Soda, MD;  Location: [facility];  Service: General;  Laterality: N/A;   RIGHT HEART CATHETERIZATION N/A 02/27/2013   Procedure: RIGHT HEART CATH;  Surgeon: Micheline Chapman, MD;  Location: [facility] CATH LAB;  Service: Cardiovascular;  Laterality: N/A;   RIGHT HEART CATHETERIZATION N/A 12/14/2013   Procedure: RIGHT HEART CATH;  Surgeon: Laurey Morale, MD;  Location: [facility] CATH LAB;  Service: Cardiovascular;  Laterality: N/A;   SIGMOIDECTOMY  2010   diverticular disease   VENTRAL HERNIA REPAIR N/A 11/15/2015   Procedure: LAPAROSCOPIC VENTRAL WALL HERNIA REPAIR;  Surgeon: Karie Soda, MD;  Location: [facility];  Service: General;  Laterality: N/A;   Patient Active Problem List   Diagnosis Date  Noted   Diabetes mellitus 03/31/2022   Myalgia 02/25/2022   Prolonged Q-T interval on ECG 02/25/2022   Aortic atherosclerosis 06/04/2021   Preoperative cardiovascular examination 06/04/2021   Hyperglycemia 02/14/2021   Rash 09/27/2020   COPD exacerbation 09/21/2020   Acute exacerbation of COPD with asthma 09/19/2020   Mixed diabetic hyperlipidemia associated with type 2 diabetes mellitus 09/19/2020   Pain of foot 06/20/2020   Medicare annual wellness visit, initial 06/20/2020   Constipation 06/11/2020   Diverticulosis of colon 06/11/2020   Irritable bowel syndrome 06/11/2020   Periumbilical pain 06/11/2020   RLS (restless legs syndrome)    Asthma exacerbation 01/08/2020   Muscle weakness 01/08/2020   Grade I diastolic dysfunction 01/08/2020   Drug-induced myopathy 10/10/2019   Advance care  planning 06/14/2019   RLQ abdominal pain 06/14/2019   COPD (chronic obstructive pulmonary disease) 06/14/2019   Leukocytosis 06/14/2019   Hypercalcemia 06/14/2019   Gout 06/14/2019   Type 2 diabetes mellitus with hyperglycemia, without long-term current use of insulin    COPD with acute exacerbation 05/03/2017   Diabetes mellitus without complication    CKD (chronic kidney disease) stage 3, GFR 30-59 ml/min    Hypersomnia with sleep apnea 09/16/2015   Fatigue 06/07/2015   Morbid obesity 06/07/2015   Cough 04/18/2015   (HFpEF) heart failure with preserved ejection fraction    Pneumonia of both lower lobes due to infectious organism 02/09/2013   OSA on CPAP 08/23/2012   Essential hypertension    Depression    GERD without esophagitis    Hyperlipidemia    Ulcerative colitis (HCC)    Patellar tendinitis 05/25/2011   Knee pain 05/25/2011   Myofascial pain 05/25/2011   Cervicalgia 05/25/2011    ONSET DATE:   05/26/2022  referral  REFERRING DIAG:  R25.1 (ICD-10-CM) - Tremor  G25.81 (ICD-10-CM) - Restless leg    THERAPY DIAG:  Difficulty in walking, not elsewhere classified  Other lack of coordination  Unsteadiness on feet  Rationale for Evaluation and Treatment: Rehabilitation  SUBJECTIVE:                                                                                                                                                                                             SUBJECTIVE STATEMENT: Patient ambulates into clinic with PT managing O2 tank.  She denies falls or acute status changes.  Her SpO2 is 96% and HR is 101 bpm at onset of session on 2L O2 nasal cannula. Pt accompanied by: self  PERTINENT HISTORY: asthma, CHF, OSA, HTN, HLD, CKD, GERD, DM, RLS  PAIN:  Are you having pain? Yes: NPRS scale: 7/10  Pain location: bilateral knees Pain description: achy, cramps Aggravating factors: walking and standing, laying down Relieving factors: resting,  sitting  PRECAUTIONS: Fall  WEIGHT BEARING RESTRICTIONS: No  FALLS: Has patient fallen in last 6 months? Yes. Number of falls 1; fell off toilet leaning too far forward  LIVING ENVIRONMENT: Lives with: lives with their spouse Lives in: House/apartment Stairs: Yes: Internal: a few steps; none Has following equipment at home: Environmental consultant - 2 wheeled and Wheelchair (manual)  PLOF: Requires assistive device for independence  PATIENT GOALS: "to work on my strength and balance"  OBJECTIVE:   COGNITION: Overall cognitive status: Within functional limits for tasks assessed   SENSATION: WFL  COORDINATION: Impaired B LE with intention tremors noted  POSTURE: rounded shoulders, forward head, increased thoracic kyphosis, posterior pelvic tilt, and flexed trunk   LOWER EXTREMITY MMT:   Grossly 4/5  BED MOBILITY:  Reports only mild difficulty   TRANSFERS: Assistive device utilized: Environmental consultant - 2 wheeled  Sit to stand: Modified independence Stand to sit: Modified independence Chair to chair: Modified independence   GAIT: Gait pattern: step through pattern, decreased stride length, shuffling, trunk flexed, and wide BOS Distance walked: clinic Assistive device utilized: Environmental consultant - 2 wheeled Level of assistance: SBA  FUNCTIONAL TESTS:     PATIENT SURVEYS:  ABC scale 10%  TODAY'S TREATMENT:                                                                                                                              -TUG (trial 1): not counted due to patient misunderstanding instructions, 1 minute and 1 seconds w/ 1.5 distances completed. -TUG (trial 2-following 1 minute seated rest):  37 seconds, pt needing cues to managed O2 line while PT manages tank particularly w/ turns  Initiated HEP: -STS 3x5 w/ BUE support; explained eccentric muscle activation to improve strain on knees during descent -Standing march w/ RW support 2x20 -Seated heel raises 2x10 SpO2 96%, HR 112 bpm  following.  -NuStep level 5 x8 minutes using BUE/BLE support for cardiovascular endurance training, RPE 6/10 following, average steps achieved 50.  SpO2 96% and HR 103 bpm.  PATIENT EDUCATION: Education details: Outcome interpretations.  Initial HEP. Person educated: Patient Education method: Explanation Education comprehension: verbalized understanding and needs further education  HOME EXERCISE PROGRAM: Access Code: [email or web address] URL: [email or web address] Date: 06/18/2022 Prepared by: Camille Bal  Exercises - Sit to Stand with Armchair  - 1 x daily - 5 x weekly - 3 sets - 5 reps - Standing March with Counter Support  - 1 x daily - 5 x weekly - 2 sets - 16-20 reps - Seated Heel Raise  - 1 x daily - 5 x weekly - 2 sets - 10 reps  GOALS: Goals reviewed with patient? Yes  SHORT TERM GOALS: Target date: 06/30/22  Pt will be independent with initial HEP for improved strength and endurance  Baseline: to be provided Goal status: INITIAL  2.  Pt will improve 5x STS to </= 22 sec to demo improved functional LE strength and balance   Baseline: 27.53s B UE Goal status: INITIAL  3.  Pt will demonstrate a gait speed of >/=1.06 feet/sec in order to decrease risk for falls. Baseline: 0.76 ft/sec Goal status: INITIAL  4.  Pt will increase BERG balance score to >/=31/56 to demonstrate improved static balance. Baseline: 26/56 (4/10) Goal status: INITIAL  5.  Pt will demonstrate TUG of </=30 seconds in order to decrease risk of falls and improve functional mobility using LRAD. Baseline: 37.00 seconds w/ RW Goal status: INITIAL  6.  Patient will improve ABC score to >/= 20% to demonstrate improved confidence with functional mobility Baseline: 10% Goal status: INITIAL  LONG TERM GOALS: Target date: 08/04/22  Pt will be independent with final HEP for improved strength and endurance  Baseline: to be provided Goal status: INITIAL  2.  Pt will improve 5x STS to </= 17  sec to demo improved functional LE strength and balance   Baseline: 27.53s B UE Goal status: INITIAL  3.  Pt will demonstrate TUG of </=23 seconds in order to decrease risk of falls and improve functional mobility using LRAD. Baseline: 37.00 seconds w/ RW Goal status: INITIAL  4.  Pt will demonstrate a gait speed of >/=1.36 feet/sec in order to decrease risk for falls. Baseline: 0.76 ft/sec Goal status: INITIAL  5.  Pt will increase BERG balance score to >/=36/56 to demonstrate improved static balance. Baseline: 26/56 (4/10) Goal status: INITIAL  6.  Patient will improve ABC score to >/= 30% to demonstrate improved confidence with mobility Baseline: 10% Goal status: INITIAL  ASSESSMENT:  CLINICAL IMPRESSION: Assessed TUG this session with patient completing in 37.00 seconds indicating severe fall risk and decreased gait speed.  She continues to be limited by severe activity intolerance, but SpO2 remains stable around 26% this session.  Established HEP focused on functional strength and endurance.  Will continue to address these deficits per POC.  OBJECTIVE IMPAIRMENTS: Abnormal gait, cardiopulmonary status limiting activity, decreased activity tolerance, decreased balance, decreased endurance, decreased knowledge of condition, decreased knowledge of use of DME, decreased mobility, difficulty walking, decreased strength, improper body mechanics, postural dysfunction, and pain.   ACTIVITY LIMITATIONS: carrying, lifting, standing, squatting, stairs, transfers, locomotion level, and caring for others  PARTICIPATION LIMITATIONS: cleaning, interpersonal relationship, driving, shopping, community activity, occupation, and yard work  PERSONAL FACTORS: Age, Fitness, Past/current experiences, Time since onset of injury/illness/exacerbation, and 3+ comorbidities: see above  are also affecting patient's functional outcome.   REHAB POTENTIAL: Fair polypharmacy, time since onset  CLINICAL  DECISION MAKING: Evolving/moderate complexity  EVALUATION COMPLEXITY: Moderate  PLAN:  PT FREQUENCY: 2x/week  PT DURATION: 6 weeks  PLANNED INTERVENTIONS: Therapeutic exercises, Therapeutic activity, Neuromuscular re-education, Balance training, Gait training, Patient/Family education, Self Care, Joint mobilization, Stair training, Vestibular training, Orthotic/Fit training, DME instructions, Aquatic Therapy, Taping, Manual therapy, and Re-evaluation  PLAN FOR NEXT SESSION: Modify HEP prn-static balance, pt would like to progress down to less UE support so she can use rollator instead of RW.     Sadie HaberMarissa B Sparkle Aube, PT, DPT 06/18/2022, 3:34 PM

## 2022-06-18 NOTE — Patient Instructions (Signed)
Access Code: [email or web address] URL: [email or web address] Date: 06/18/2022 Prepared by: Camille Bal  Exercises - Sit to Stand with Armchair  - 1 x daily - 5 x weekly - 3 sets - 5 reps - Standing March with Counter Support  - 1 x daily - 5 x weekly - 2 sets - 16-20 reps - Seated Heel Raise  - 1 x daily - 5 x weekly - 2 sets - 10 reps

## 2022-06-19 MED ORDER — SEMAGLUTIDE (1 MG/DOSE) 4 MG/3ML ~~LOC~~ SOPN: 1.0000 mg | PEN_INJECTOR | SUBCUTANEOUS | 6 refills | Status: DC

## 2022-06-19 NOTE — Addendum Note (Signed)
Addended by: Scarlette Shorts on: 06/19/2022 02:21 PM   Modules accepted: Orders

## 2022-06-20 DIAGNOSIS — J449 Chronic obstructive pulmonary disease, unspecified: Secondary | ICD-10-CM | POA: Diagnosis not present

## 2022-06-23 ENCOUNTER — Telehealth: Payer: Self-pay | Admitting: Pharmacist

## 2022-06-23 ENCOUNTER — Ambulatory Visit: Payer: Medicare HMO | Admitting: Physical Therapy

## 2022-06-23 VITALS — BP 142/76

## 2022-06-23 DIAGNOSIS — R2681 Unsteadiness on feet: Secondary | ICD-10-CM | POA: Diagnosis not present

## 2022-06-23 DIAGNOSIS — G2581 Restless legs syndrome: Secondary | ICD-10-CM | POA: Diagnosis not present

## 2022-06-23 DIAGNOSIS — R262 Difficulty in walking, not elsewhere classified: Secondary | ICD-10-CM

## 2022-06-23 DIAGNOSIS — R278 Other lack of coordination: Secondary | ICD-10-CM | POA: Diagnosis not present

## 2022-06-23 DIAGNOSIS — R251 Tremor, unspecified: Secondary | ICD-10-CM | POA: Diagnosis not present

## 2022-06-23 NOTE — Telephone Encounter (Signed)
Patient was not able to tolerate Repatha (felt worse overall, c/o aches. Med was held per PCP in March and pt reports she felt better off of it). She was on Praluent last year and tolerated it well but had to switch in 2024 due to insurance preference. Now that she has tried/failed Repatha, I had submitted PA for Praluent which has now been approved. Patient is willing to switch back to Praluent as long as it is covered.  Routing to cardiology for input.

## 2022-06-23 NOTE — Therapy (Signed)
OUTPATIENT PHYSICAL THERAPY NEURO TREATMENT   Patient Name: Kathleen Hatfield MRN: [id number] DOB:08-04-1955, 67 y.o., female Today's Date: 06/23/2022   PCP: Crawford Givens, MD REFERRING PROVIDER: Marcos Eke, PA-C   END OF SESSION:  PT End of Session - 06/23/22 1319     Visit Number 4    Number of Visits 13    Date for PT Re-Evaluation 08/04/22    Authorization Type [employer] medicare    PT Start Time 1317    PT Stop Time 1400    PT Time Calculation (min) 43 min    Equipment Utilized During Treatment Oxygen;Gait belt    Activity Tolerance Patient limited by fatigue    Behavior During Therapy [facility] for tasks assessed/performed             Past Medical History:  Diagnosis Date   Anemia    Arthritis    Asthma    Chronic diastolic CHF 05/2014   Echocardiogram 05/2019: EF 70, no RWMA, mild LVH, Gr 1 DD, normal RVSF, severe LVH, borderline asc Aorta (39 mm)   CKD (chronic kidney disease)    Depression    Diabetes mellitus without complication    Diverticulitis    Dyspnea    with exertion   GERD (gastroesophageal reflux disease)    History of blood transfusion    Hyperlipidemia    cannot tolerate statins   Hypertension    patient states she has never had high blood pressure.    Nuclear stress test    Myoview 05/2019: EF 83, no ischemia or infarction; Low Risk   Peripheral neuropathy    Pneumonia    PONV (postoperative nausea and vomiting)    RLS (restless legs syndrome)    Sleep apnea    uses CPAP   Ulcerative colitis    dr [address]   Past Surgical History:  Procedure Laterality Date   ABDOMINAL HYSTERECTOMY     APPENDECTOMY     BIOPSY  10/10/2021   Procedure: BIOPSY;  Surgeon: Jeani Hawking, MD;  Location: [facility];  Service: Gastroenterology;;   BREAST BIOPSY Left    BREAST SURGERY     left biopsy   CARDIAC CATHETERIZATION     CESAREAN SECTION     CHOLECYSTECTOMY     COLONOSCOPY WITH PROPOFOL N/A 10/10/2021   Procedure: COLONOSCOPY WITH PROPOFOL;  Surgeon: Jeani Hawking, MD;  Location: [facility];  Service: Gastroenterology;  Laterality: N/A;   HERNIA REPAIR     INSERTION OF MESH N/A 11/15/2015   Procedure: INSERTION OF MESH;  Surgeon: Karie Soda, MD;  Location: [facility];  Service: General;  Laterality: N/A;   LAPAROSCOPIC LYSIS OF ADHESIONS N/A 11/15/2015   Procedure: LAPAROSCOPIC LYSIS OF ADHESIONS;  Surgeon: Karie Soda, MD;  Location: [facility];  Service: General;  Laterality: N/A;   RIGHT HEART CATHETERIZATION N/A 02/27/2013   Procedure: RIGHT HEART CATH;  Surgeon: Micheline Chapman, MD;  Location: [facility] CATH LAB;  Service: Cardiovascular;  Laterality: N/A;   RIGHT HEART CATHETERIZATION N/A 12/14/2013   Procedure: RIGHT HEART CATH;  Surgeon: Laurey Morale, MD;  Location: [facility] CATH LAB;  Service: Cardiovascular;  Laterality: N/A;   SIGMOIDECTOMY  2010   diverticular disease   VENTRAL HERNIA REPAIR N/A 11/15/2015   Procedure: LAPAROSCOPIC VENTRAL WALL HERNIA REPAIR;  Surgeon: Karie Soda, MD;  Location: [facility];  Service: General;  Laterality: N/A;   Patient Active Problem List   Diagnosis Date Noted   Diabetes mellitus 03/31/2022   Myalgia 02/25/2022  Prolonged Q-T interval on ECG 02/25/2022   Aortic atherosclerosis 06/04/2021   Preoperative cardiovascular examination 06/04/2021   Hyperglycemia 02/14/2021   Rash 09/27/2020   COPD exacerbation 09/21/2020   Acute exacerbation of COPD with asthma 09/19/2020   Mixed diabetic hyperlipidemia associated with type 2 diabetes mellitus 09/19/2020   Pain of foot 06/20/2020   Medicare annual wellness visit, initial 06/20/2020   Constipation 06/11/2020   Diverticulosis of colon 06/11/2020   Irritable bowel syndrome 06/11/2020   Periumbilical pain 06/11/2020   RLS (restless legs syndrome)    Asthma exacerbation 01/08/2020   Muscle weakness 01/08/2020   Grade I diastolic dysfunction 01/08/2020   Drug-induced myopathy 10/10/2019   Advance care planning 06/14/2019   RLQ abdominal pain 06/14/2019    COPD (chronic obstructive pulmonary disease) 06/14/2019   Leukocytosis 06/14/2019   Hypercalcemia 06/14/2019   Gout 06/14/2019   Type 2 diabetes mellitus with hyperglycemia, without long-term current use of insulin    COPD with acute exacerbation 05/03/2017   Diabetes mellitus without complication    CKD (chronic kidney disease) stage 3, GFR 30-59 ml/min    Hypersomnia with sleep apnea 09/16/2015   Fatigue 06/07/2015   Morbid obesity 06/07/2015   Cough 04/18/2015   (HFpEF) heart failure with preserved ejection fraction    Pneumonia of both lower lobes due to infectious organism 02/09/2013   OSA on CPAP 08/23/2012   Essential hypertension    Depression    GERD without esophagitis    Hyperlipidemia    Ulcerative colitis (HCC)    Patellar tendinitis 05/25/2011   Knee pain 05/25/2011   Myofascial pain 05/25/2011   Cervicalgia 05/25/2011    ONSET DATE:   05/26/2022  referral  REFERRING DIAG:  R25.1 (ICD-10-CM) - Tremor  G25.81 (ICD-10-CM) - Restless leg    THERAPY DIAG:  Difficulty in walking, not elsewhere classified  Other lack of coordination  Unsteadiness on feet  Rationale for Evaluation and Treatment: Rehabilitation  SUBJECTIVE:                                                                                                                                                                                             SUBJECTIVE STATEMENT: Patient ambulates into clinic with PT managing O2 tank. Patient reports some muscle soreness since last session. Her SpO2 is 94% and HR is 101 bpm at onset of session on 2L O2 nasal cannula. Patient reports that she has some upcoming medication changes but they have not been adjusted this time. Reports trying a few of exercise at home. Patient reports feeling increased shakiness over the last day.   Pt accompanied by: self  PERTINENT HISTORY: asthma, CHF, OSA, HTN, HLD, CKD, GERD, DM, RLS  PAIN:  Are you having pain? Yes: NPRS  scale: 7-8/10 Pain location: bilateral knees Pain description: achy, cramps Aggravating factors: walking and standing, laying down Relieving factors: resting, sitting  PRECAUTIONS: Fall  WEIGHT BEARING RESTRICTIONS: No  FALLS: Has patient fallen in last 6 months? Yes. Number of falls 1; fell off toilet leaning too far forward  LIVING ENVIRONMENT: Lives with: lives with their spouse Lives in: House/apartment Stairs: Yes: Internal: a few steps; none Has following equipment at home: Environmental consultant - 2 wheeled and Wheelchair (manual)  PLOF: Requires assistive device for independence  PATIENT GOALS: "to work on my strength and balance"  OBJECTIVE:   COGNITION: Overall cognitive status: Within functional limits for tasks assessed   TODAY'S TREATMENT:                                                                                                                               SpO2 94-96% and HR 94- bpm.  Vitals:   06/23/22 1327  BP: (!) 142/76    TherAct: Adjusted walker height to lower setting; initiated trial walking 3 x 40 feet with RW (SBA and assistance with oxygen tank) Patient reports improved tolerance for shoulder pain with modification NMR: 3 x 80 feet obstacle navigation around gym with RW with SBA Between // bars with SBA-CGA EO with NBOS on airex 3 x 30"   Patient required frequent seated breaks throughout session; required max encouragement to participate in activity. Required management on part of therapist for oxygen tank.   PATIENT EDUCATION: Education details: Continue HEP Person educated: Patient Education method: Explanation Education comprehension: verbalized understanding and needs further education  HOME EXERCISE PROGRAM: Access Code: [email or web address] URL: [email or web address] Date: 06/18/2022 Prepared by: Camille Bal  Exercises - Sit to Stand with Armchair  - 1 x daily - 5 x weekly - 3 sets - 5 reps - Standing March with Counter Support   - 1 x daily - 5 x weekly - 2 sets - 16-20 reps - Seated Heel Raise  - 1 x daily - 5 x weekly - 2 sets - 10 reps  GOALS: Goals reviewed with patient? Yes  SHORT TERM GOALS: Target date: 06/30/22  Pt will be independent with initial HEP for improved strength and endurance  Baseline: to be provided Goal status: INITIAL  2.  Pt will improve 5x STS to </= 22 sec to demo improved functional LE strength and balance   Baseline: 27.53s B UE Goal status: INITIAL  3.  Pt will demonstrate a gait speed of >/=1.06 feet/sec in order to decrease risk for falls. Baseline: 0.76 ft/sec Goal status: INITIAL  4.  Pt will increase BERG balance score to >/=31/56 to demonstrate improved static balance. Baseline: 26/56 (4/10) Goal status: INITIAL  5.  Pt will demonstrate TUG of </=30 seconds in order to decrease risk of falls and improve functional mobility using LRAD. Baseline:  37.00 seconds w/ RW Goal status: INITIAL  6.  Patient will improve ABC score to >/= 20% to demonstrate improved confidence with functional mobility Baseline: 10% Goal status: INITIAL  LONG TERM GOALS: Target date: 08/04/22  Pt will be independent with final HEP for improved strength and endurance  Baseline: to be provided Goal status: INITIAL  2.  Pt will improve 5x STS to </= 17 sec to demo improved functional LE strength and balance   Baseline: 27.53s B UE Goal status: INITIAL  3.  Pt will demonstrate TUG of </=23 seconds in order to decrease risk of falls and improve functional mobility using LRAD. Baseline: 37.00 seconds w/ RW Goal status: INITIAL  4.  Pt will demonstrate a gait speed of >/=1.36 feet/sec in order to decrease risk for falls. Baseline: 0.76 ft/sec Goal status: INITIAL  5.  Pt will increase BERG balance score to >/=36/56 to demonstrate improved static balance. Baseline: 26/56 (4/10) Goal status: INITIAL  6.  Patient will improve ABC score to >/= 30% to demonstrate improved confidence with  mobility Baseline: 10% Goal status: INITIAL  ASSESSMENT:  CLINICAL IMPRESSION: Session emphasized continued work on endurance and functional tolerance to activity. Patient reported that she was getting some pinching in shoulders due to walker so adjusted walker height, which patient reported improved symptoms. Adjustments improved body mechanics. Patient required max encouragement during session to increase activity tolerance. Will continue to address these deficits per POC.  OBJECTIVE IMPAIRMENTS: Abnormal gait, cardiopulmonary status limiting activity, decreased activity tolerance, decreased balance, decreased endurance, decreased knowledge of condition, decreased knowledge of use of DME, decreased mobility, difficulty walking, decreased strength, improper body mechanics, postural dysfunction, and pain.   ACTIVITY LIMITATIONS: carrying, lifting, standing, squatting, stairs, transfers, locomotion level, and caring for others  PARTICIPATION LIMITATIONS: cleaning, interpersonal relationship, driving, shopping, community activity, occupation, and yard work  PERSONAL FACTORS: Age, Fitness, Past/current experiences, Time since onset of injury/illness/exacerbation, and 3+ comorbidities: see above  are also affecting patient's functional outcome.   REHAB POTENTIAL: Fair polypharmacy, time since onset  CLINICAL DECISION MAKING: Evolving/moderate complexity  EVALUATION COMPLEXITY: Moderate  PLAN:  PT FREQUENCY: 2x/week  PT DURATION: 6 weeks  PLANNED INTERVENTIONS: Therapeutic exercises, Therapeutic activity, Neuromuscular re-education, Balance training, Gait training, Patient/Family education, Self Care, Joint mobilization, Stair training, Vestibular training, Orthotic/Fit training, DME instructions, Aquatic Therapy, Taping, Manual therapy, and Re-evaluation  PLAN FOR NEXT SESSION: Modify HEP prn-static balance, pt would like to progress down to less UE support so she can use rollator instead  of RW, work on endurance tasks and increased ambulation distance  Carmelia Bake, PT, DPT 06/23/2022, 4:38 PM

## 2022-06-24 NOTE — Telephone Encounter (Signed)
Yes that's just fine, thank you!

## 2022-06-25 ENCOUNTER — Telehealth: Payer: Self-pay

## 2022-06-25 ENCOUNTER — Ambulatory Visit: Payer: Medicare HMO | Admitting: Physical Therapy

## 2022-06-25 NOTE — Progress Notes (Signed)
Care Management & Coordination Services Pharmacy Team  Reason for Encounter: Medication coordination and delivery  Contacted patient to discuss medications and coordinate delivery from [facility]. Spoke with patient on 06/29/2022  Cycle dispensing form sent to [facility]  for review.   Last adherence delivery date:06/08/22      Patient is due for next adherence delivery on: 07/07/22  This delivery to include: Adherence Packaging  30 Days  Bupropion HCL XL 300mg - take 1 tablet at evening meal Cholecalciferol (vit D3)-5000units- take 1 tablet breakfast every other day  Ropinirole 4mg - take 1 tablet breakfast 1 tablet bedtime  Pantoprazole 40mg - take 1 tablet bedtime Spironolactone 25mg - take 1 tablet breakfast Furosemide 20mg  take 3 tablets breakfast and dinner Duloxetine 60mg - take 1 tablet breakfast Ferrous Sulfate 325mg - take 1 tablet evening meal every other day  Gabapentin 300mg -take 1 tablet bedtime Aspirin 81mg  -take 1 tablet at breakfast  Vials: none  Patient declined the following medications this month: Gabapentin 300mg - supply on hand  Novolog Flex pen- has surplus on hand  Pen needles 32g- has surplus on hand   No refill request needed.  Confirmed delivery date of 07/07/22, advised patient that pharmacy will contact them the morning of delivery.   Any concerns about your medications? Patient reports she just started ozempic   How often do you forget or accidentally miss a dose? Never  Do you use a pillbox? No  Is patient in packaging Yes  Any concerns or issues with your packaging? Satisfied   Recent blood pressure readings are as follows:none available   Recent blood glucose readings are as follows: Patient reports struggles with BG's staying under 300- Patient just started Ozempic and has increased dose of tresiba.   Chart review: Recent office visits:  06/26/22-Graham Duncan,MD(PCP)-f/u DM, Off trulicity and on ozempic, first dose yesterday.   Taking 70 units tresiba daily. Using Novolog sliding scale. A1c 11.1.  Recent consult visits:  None since last contact  Hospital visits:  None in previous 6 months  Medications: Outpatient Encounter Medications as of 06/25/2022  Medication Sig   acetaminophen (TYLENOL) 500 MG tablet Take 500 mg by mouth every 6 (six) hours as needed for moderate pain.   albuterol (VENTOLIN HFA) 108 (90 Base) MCG/ACT inhaler INHALE TWO PUFFS BY MOUTH INTO LUNGS every SIX hours AS NEEDED   aspirin EC 81 MG tablet Take 1 tablet (81 mg total) by mouth daily. Swallow whole.   BAYER CONTOUR NEXT TEST test strip 1 each by Other route daily. As directed   budesonide (PULMICORT) 0.5 MG/2ML nebulizer solution Take 2 mLs (0.5 mg total) by nebulization 2 (two) times daily.   buPROPion (WELLBUTRIN XL) 300 MG 24 hr tablet TAKE ONE TABLET BY MOUTH EVERY MORNING   Cholecalciferol (VITAMIN D-3) 5000 UNITS TABS Take 5,000 Units by mouth every other day.    colchicine 0.6 MG tablet TAKE 1 TABLET (0.6 MG TOTAL) BY MOUTH DAILY AS NEEDED (FOR GOUT)   Continuous Blood Gluc Receiver (DEXCOM G7 RECEIVER) DEVI by Does not apply route.   Continuous Blood Gluc Sensor (DEXCOM G7 SENSOR) MISC 1 Device by Does not apply route as directed. Apply sensor every 10 days   dicyclomine (BENTYL) 20 MG tablet TAKE 1 TABLET UP TO FOUR TIMES A DAY FOR GI CRAMPING, PAIN, NAUSEA, AND VOMITING AS NEEDED   DULoxetine (CYMBALTA) 60 MG capsule TAKE ONE CAPSULE BY MOUTH EVERYDAY AT BEDTIME   estradiol (ESTRACE) 0.1 MG/GM vaginal cream Place 1 Applicatorful vaginally 3 (three) times  a week.   FEROSUL 325 (65 Fe) MG tablet TAKE ONE TABLET BY MOUTH every other evening   fluticasone (FLONASE) 50 MCG/ACT nasal spray Place 2 sprays into both nostrils daily.   formoterol (PERFOROMIST) 20 MCG/2ML nebulizer solution Take 2 mLs (20 mcg total) by nebulization 2 (two) times daily.   furosemide (LASIX) 20 MG tablet Take 3 tablets (60 mg total) by mouth 2 (two) times  daily.   gabapentin (NEURONTIN) 300 MG capsule TAKE ONE CAPSULE BY MOUTH EVERYDAY AT BEDTIME May take additional capsule daily if needed   insulin aspart (NOVOLOG FLEXPEN) 100 UNIT/ML FlexPen Max daily 45 units   insulin degludec (TRESIBA FLEXTOUCH) 200 UNIT/ML FlexTouch Pen Inject 70 Units into the skin daily.   Insulin Pen Needle (GLOBAL EASE INJECT PEN NEEDLES) 32G X 4 MM MISC 1 Device by Other route in the morning, at noon, in the evening, and at bedtime.   ipratropium-albuterol (DUONEB) 0.5-2.5 (3) MG/3ML SOLN Take 3 mLs by nebulization every 6 (six) hours as needed (wheezing).   ketoconazole (NIZORAL) 2 % cream APPLY TO AFFECTED AREA TWICE A DAY   meloxicam (MOBIC) 7.5 MG tablet Take 1 tablet (7.5 mg total) by mouth daily as needed for pain.   metolazone (ZAROXOLYN) 5 MG tablet TAKE 1 TABLET BY MOUTH EVERY DAY AS NEEDED   MICROLET LANCETS MISC 1 each by Other route. As directed   nitrofurantoin (MACRODANTIN) 50 MG capsule Take 50 mg by mouth daily.   nystatin (MYCOSTATIN/NYSTOP) powder Apply 1 application topically 3 (three) times daily.   ondansetron (ZOFRAN) 4 MG tablet TAKE ONE TABLET BY MOUTH every EIGHT hours AS NEEDED FOR NAUSEA AND VOMITING   OXYGEN Inhale 2 L into the lungs continuous.   pantoprazole (PROTONIX) 40 MG tablet TAKE ONE TABLET BY MOUTH EVERY MORNING   polyethylene glycol (MIRALAX / GLYCOLAX) packet Take 17 g by mouth daily as needed for mild constipation.   potassium chloride (MICRO-K) 10 MEQ CR capsule Take 5 capsules (50 mEq total) by mouth daily.   primidone (MYSOLINE) 50 MG tablet Take 0.25 tablets by mouth daily as needed.   revefenacin (YUPELRI) 175 MCG/3ML nebulizer solution Inhale one vial in nebulizer once daily. Do not mix with other nebulized medications.   rOPINIRole (REQUIP) 4 MG tablet TAKE 0.5-1 TABLET BY MOUTH EVERY MORNING and TAKE 1 TABLET BY MOUTH EVERYDAY AT BEDTIME   Semaglutide, 1 MG/DOSE, 4 MG/3ML SOPN Inject 1 mg as directed once a week.    simethicone (GAS-X) 80 MG chewable tablet Chew 1 tablet (80 mg total) by mouth every 6 (six) hours as needed for flatulence.   spironolactone (ALDACTONE) 25 MG tablet Take 0.5 tablets (12.5 mg total) by mouth daily.   traMADol (ULTRAM) 50 MG tablet Take 1 tablet (50 mg total) by mouth 3 (three) times daily as needed.   UNABLE TO FIND CPAP- At bedtime   No facility-administered encounter medications on file as of 06/25/2022.   BP Readings from Last 3 Encounters:  06/23/22 (!) 142/76  05/26/22 129/78  05/22/22 104/60    Pulse Readings from Last 3 Encounters:  05/26/22 74  05/22/22 93  03/31/22 (!) 103    Lab Results  Component Value Date/Time   HGBA1C 11.8 (A) 03/31/2022 09:20 AM   HGBA1C 7.6 (H) 11/07/2021 03:47 PM   HGBA1C 9.0 (A) 05/14/2021 03:10 PM   HGBA1C 10.9 (H) 02/14/2021 06:59 AM   Lab Results  Component Value Date   CREATININE 1.61 (H) 02/25/2022   BUN 27 02/25/2022  GFR 48.76 (L) 02/27/2021   GFRNONAA 48 (L) 03/31/2021   GFRAA 69 01/19/2020   NA 136 02/25/2022   K 4.2 02/25/2022   CALCIUM 10.1 02/25/2022   CO2 27 02/25/2022     Al Corpus, PharmD notified  Burt Knack, [facility] Clinical Pharmacy Assistant [phone]

## 2022-06-26 ENCOUNTER — Ambulatory Visit (INDEPENDENT_AMBULATORY_CARE_PROVIDER_SITE_OTHER): Payer: Medicare HMO | Admitting: Family Medicine

## 2022-06-26 ENCOUNTER — Encounter: Payer: Self-pay | Admitting: Family Medicine

## 2022-06-26 VITALS — BP 118/78 | HR 96 | Temp 98.2°F | Ht 65.0 in | Wt 272.0 lb

## 2022-06-26 DIAGNOSIS — E1169 Type 2 diabetes mellitus with other specified complication: Secondary | ICD-10-CM

## 2022-06-26 DIAGNOSIS — Z794 Long term (current) use of insulin: Secondary | ICD-10-CM | POA: Diagnosis not present

## 2022-06-26 DIAGNOSIS — J441 Chronic obstructive pulmonary disease with (acute) exacerbation: Secondary | ICD-10-CM | POA: Diagnosis not present

## 2022-06-26 DIAGNOSIS — I5032 Chronic diastolic (congestive) heart failure: Secondary | ICD-10-CM

## 2022-06-26 DIAGNOSIS — Z7985 Long-term (current) use of injectable non-insulin antidiabetic drugs: Secondary | ICD-10-CM

## 2022-06-26 DIAGNOSIS — E119 Type 2 diabetes mellitus without complications: Secondary | ICD-10-CM | POA: Diagnosis not present

## 2022-06-26 MED ORDER — PRALUENT 150 MG/ML ~~LOC~~ SOAJ: 150.0000 mg | SUBCUTANEOUS | Status: DC

## 2022-06-26 MED ORDER — ROPINIROLE HCL 4 MG PO TABS: ORAL_TABLET | ORAL | Status: DC

## 2022-06-26 MED ORDER — TRESIBA FLEXTOUCH 200 UNIT/ML ~~LOC~~ SOPN: 70.0000 [IU] | PEN_INJECTOR | Freq: Every day | SUBCUTANEOUS | Status: DC

## 2022-06-26 NOTE — Progress Notes (Unsigned)
Fatigued.  Sugar still up 200-300.  Off trulicity and on ozempic, first dose yesterday.  Taking 70 units tresiba daily. Using Novolog sliding scale.    She took metolazone last and was up all night with urination.  She is down 9 lbs from recent max weight at home.  She feels weaker in general today.  No fevers but had aches last night.  She hasn't felt well in general for about 3 weeks.    She felt better off perforomist- "I left like my lungs were coated with something when I was using it".  Using SABA prn.  Still on yupelri at baseline.  Some wheeze.  "Thick feeling" in chest improved in the meantime, off perforomist.    She has been going to PT to try to inc her exertional capacity, with SOBOE attributed to deconditioning.    Meds, vitals, and allergies reviewed.   ROS: Per HPI unless specifically indicated in ROS section   Nad Ncat Neck supple, no LA Rrr Ctab Abd soft, not ttp Skin well perfused.   Ankles with minimal edema.    30 minutes were devoted to patient care in this encounter (this includes time spent reviewing the patient's file/history, interviewing and examining the patient, counseling/reviewing plan with patient).

## 2022-06-26 NOTE — Patient Instructions (Signed)
Gradually increase tresiba.   70------>72------>74------>76------>78------>80 units per day, if your AM sugar is above 150.  Go to the lab on the way out.   If you have mychart we'll likely use that to update you.    Take care.  Glad to see you.

## 2022-06-27 LAB — BASIC METABOLIC PANEL
BUN/Creatinine Ratio: 19 (calc) (ref 6–22)
BUN: 30 mg/dL — ABNORMAL HIGH (ref 7–25)
CO2: 28 mmol/L (ref 20–32)
Calcium: 11.2 mg/dL — ABNORMAL HIGH (ref 8.6–10.4)
Chloride: 88 mmol/L — ABNORMAL LOW (ref 98–110)
Creat: 1.57 mg/dL — ABNORMAL HIGH (ref 0.50–1.05)
Glucose, Bld: 337 mg/dL — ABNORMAL HIGH (ref 65–99)
Potassium: 4.3 mmol/L (ref 3.5–5.3)
Sodium: 135 mmol/L (ref 135–146)

## 2022-06-27 LAB — HEMOGLOBIN A1C
Hgb A1c MFr Bld: 11.1 % of total Hgb — ABNORMAL HIGH (ref <5.7)
Mean Plasma Glucose: 272 mg/dL
eAG (mmol/L): 15.1 mmol/L

## 2022-06-28 NOTE — Assessment & Plan Note (Signed)
She felt better off perforomist- "I left like my lungs were coated with something when I was using it".  Using SABA prn.  Still on yupelri at baseline.  Some wheeze.  "Thick feeling" in chest improved in the meantime, off perforomist.  No change in meds for now.  Lungs are CTAB.

## 2022-06-28 NOTE — Assessment & Plan Note (Signed)
With dose of metolazone last night. She is in PT to inc exertional capacity.  See notes on labs.  Continue lasix, potassium, spironolactone as is for now.

## 2022-06-28 NOTE — Assessment & Plan Note (Signed)
See notes on labs.  Gradually increase tresiba in the meantime.   70------>72------>74------>76------>78------>80 units per day, if AM sugar is above 150.  We may need to inc ozempic, in the future.  D/w pt.

## 2022-06-30 ENCOUNTER — Ambulatory Visit: Payer: Medicare HMO

## 2022-06-30 DIAGNOSIS — R278 Other lack of coordination: Secondary | ICD-10-CM

## 2022-06-30 DIAGNOSIS — R262 Difficulty in walking, not elsewhere classified: Secondary | ICD-10-CM

## 2022-06-30 DIAGNOSIS — R2681 Unsteadiness on feet: Secondary | ICD-10-CM

## 2022-06-30 NOTE — Telephone Encounter (Signed)
Medication renewed per PCP.   [place] - please contact [facility] and request refill for Praluent. Then inform patient the Praluent was approved through her insurance and [facility] will restart filling it.

## 2022-06-30 NOTE — Therapy (Signed)
OUTPATIENT PHYSICAL THERAPY NEURO TREATMENT/ ARRIVED NO CHARGE   Patient Name: Kathleen Hatfield MRN: [id number] DOB:1955/09/28, 67 y.o., female Today's Date: 06/30/2022   PCP: Crawford Givens, MD REFERRING PROVIDER: Marcos Eke, PA-C   END OF SESSION:  PT End of Session - 06/30/22 1318     Visit Number 4    Number of Visits 13    Date for PT Re-Evaluation 08/04/22    Authorization Type [employer]na medicare    PT Start Time 1315    PT Stop Time 1330   arrived no charge   PT Time Calculation (min) 15 min    Equipment Utilized During Treatment Oxygen    Activity Tolerance Treatment limited secondary to medical complications (Comment)    Behavior During Therapy [facility] for tasks assessed/performed             Past Medical History:  Diagnosis Date   Anemia    Arthritis    Asthma    Chronic diastolic CHF 05/2014   Echocardiogram 05/2019: EF 70, no RWMA, mild LVH, Gr 1 DD, normal RVSF, severe LVH, borderline asc Aorta (39 mm)   CKD (chronic kidney disease)    Depression    Diabetes mellitus without complication    Diverticulitis    Dyspnea    with exertion   GERD (gastroesophageal reflux disease)    History of blood transfusion    Hyperlipidemia    cannot tolerate statins   Hypertension    patient states she has never had high blood pressure.    Nuclear stress test    Myoview 05/2019: EF 83, no ischemia or infarction; Low Risk   Peripheral neuropathy    Pneumonia    PONV (postoperative nausea and vomiting)    RLS (restless legs syndrome)    Sleep apnea    uses CPAP   Ulcerative colitis    dr [address]   Past Surgical History:  Procedure Laterality Date   ABDOMINAL HYSTERECTOMY     APPENDECTOMY     BIOPSY  10/10/2021   Procedure: BIOPSY;  Surgeon: Jeani Hawking, MD;  Location: [facility];  Service: Gastroenterology;;   BREAST BIOPSY Left    BREAST SURGERY     left biopsy   CARDIAC CATHETERIZATION     CESAREAN SECTION     CHOLECYSTECTOMY     COLONOSCOPY WITH PROPOFOL N/A  10/10/2021   Procedure: COLONOSCOPY WITH PROPOFOL;  Surgeon: Jeani Hawking, MD;  Location: [facility];  Service: Gastroenterology;  Laterality: N/A;   HERNIA REPAIR     INSERTION OF MESH N/A 11/15/2015   Procedure: INSERTION OF MESH;  Surgeon: Karie Soda, MD;  Location: [facility];  Service: General;  Laterality: N/A;   LAPAROSCOPIC LYSIS OF ADHESIONS N/A 11/15/2015   Procedure: LAPAROSCOPIC LYSIS OF ADHESIONS;  Surgeon: Karie Soda, MD;  Location: [facility];  Service: General;  Laterality: N/A;   RIGHT HEART CATHETERIZATION N/A 02/27/2013   Procedure: RIGHT HEART CATH;  Surgeon: Micheline Chapman, MD;  Location: [facility] CATH LAB;  Service: Cardiovascular;  Laterality: N/A;   RIGHT HEART CATHETERIZATION N/A 12/14/2013   Procedure: RIGHT HEART CATH;  Surgeon: Laurey Morale, MD;  Location: [facility] CATH LAB;  Service: Cardiovascular;  Laterality: N/A;   SIGMOIDECTOMY  2010   diverticular disease   VENTRAL HERNIA REPAIR N/A 11/15/2015   Procedure: LAPAROSCOPIC VENTRAL WALL HERNIA REPAIR;  Surgeon: Karie Soda, MD;  Location: [facility];  Service: General;  Laterality: N/A;   Patient Active Problem List   Diagnosis Date Noted  Diabetes mellitus 03/31/2022   Myalgia 02/25/2022   Prolonged Q-T interval on ECG 02/25/2022   Aortic atherosclerosis 06/04/2021   Preoperative cardiovascular examination 06/04/2021   Hyperglycemia 02/14/2021   Rash 09/27/2020   COPD exacerbation 09/21/2020   Acute exacerbation of COPD with asthma 09/19/2020   Mixed diabetic hyperlipidemia associated with type 2 diabetes mellitus 09/19/2020   Pain of foot 06/20/2020   Medicare annual wellness visit, initial 06/20/2020   Constipation 06/11/2020   Diverticulosis of colon 06/11/2020   Irritable bowel syndrome 06/11/2020   Periumbilical pain 06/11/2020   RLS (restless legs syndrome)    Asthma exacerbation 01/08/2020   Muscle weakness 01/08/2020   Grade I diastolic dysfunction 01/08/2020   Drug-induced myopathy 10/10/2019    Advance care planning 06/14/2019   RLQ abdominal pain 06/14/2019   COPD (chronic obstructive pulmonary disease) 06/14/2019   Leukocytosis 06/14/2019   Hypercalcemia 06/14/2019   Gout 06/14/2019   COPD with acute exacerbation 05/03/2017   CKD (chronic kidney disease) stage 3, GFR 30-59 ml/min    Hypersomnia with sleep apnea 09/16/2015   Fatigue 06/07/2015   Morbid obesity 06/07/2015   Cough 04/18/2015   Chronic diastolic CHF 05/2014   (HFpEF) heart failure with preserved ejection fraction    Pneumonia of both lower lobes due to infectious organism 02/09/2013   OSA on CPAP 08/23/2012   Essential hypertension    Depression    GERD without esophagitis    Hyperlipidemia    Ulcerative colitis (HCC)    Patellar tendinitis 05/25/2011   Knee pain 05/25/2011   Myofascial pain 05/25/2011   Cervicalgia 05/25/2011    ONSET DATE:   05/26/2022  referral  REFERRING DIAG:  R25.1 (ICD-10-CM) - Tremor  G25.81 (ICD-10-CM) - Restless leg    THERAPY DIAG:  Difficulty in walking, not elsewhere classified  Other lack of coordination  Unsteadiness on feet  Rationale for Evaluation and Treatment: Rehabilitation  SUBJECTIVE:                                                                                                                                                                                             SUBJECTIVE STATEMENT: Patient arrives to clinic with RW + O2 tank. Her blood glucose (on continuous monitor) was reading at 384 and has not been below 300 in ~1 week per patient report. PT able to see on monitor that it has been below 300 only very briefly in the past 24 hours. She reports that MD has increased her insulin, but despite this titration it remains severely elevated.   Pt accompanied by: self  PERTINENT HISTORY: asthma, CHF, OSA, HTN, HLD, CKD,  GERD, DM, RLS  PAIN:  Are you having pain? Yes: NPRS scale: 7-8/10 Pain location: bilateral knees Pain description: achy,  cramps Aggravating factors: walking and standing, laying down Relieving factors: resting, sitting  PRECAUTIONS: Fall  PATIENT GOALS: "to work on my strength and balance"  TODAY'S TREATMENT:                                                                                                                              Arrived no charge  PATIENT EDUCATION: Education details: therapeutic BG ranges for PT, need to call PCP/endocrinologist or go to ER for BG management ASAP, PT POC Person educated: Patient Education method: Explanation Education comprehension: verbalized understanding and needs further education  HOME EXERCISE PROGRAM: Access Code: [email or web address] URL: [email or web address] Date: 06/18/2022 Prepared by: Camille Bal  Exercises - Sit to Stand with Armchair  - 1 x daily - 5 x weekly - 3 sets - 5 reps - Standing March with Counter Support  - 1 x daily - 5 x weekly - 2 sets - 16-20 reps - Seated Heel Raise  - 1 x daily - 5 x weekly - 2 sets - 10 reps  GOALS: Goals reviewed with patient? Yes  SHORT TERM GOALS: Target date: 06/30/22  Pt will be independent with initial HEP for improved strength and endurance  Baseline: to be provided Goal status: INITIAL  2.  Pt will improve 5x STS to </= 22 sec to demo improved functional LE strength and balance   Baseline: 27.53s B UE Goal status: INITIAL  3.  Pt will demonstrate a gait speed of >/=1.06 feet/sec in order to decrease risk for falls. Baseline: 0.76 ft/sec Goal status: INITIAL  4.  Pt will increase BERG balance score to >/=31/56 to demonstrate improved static balance. Baseline: 26/56 (4/10) Goal status: INITIAL  5.  Pt will demonstrate TUG of </=30 seconds in order to decrease risk of falls and improve functional mobility using LRAD. Baseline: 37.00 seconds w/ RW Goal status: INITIAL  6.  Patient will improve ABC score to >/= 20% to demonstrate improved confidence with functional mobility Baseline:  10% Goal status: INITIAL  LONG TERM GOALS: Target date: 08/04/22  Pt will be independent with final HEP for improved strength and endurance  Baseline: to be provided Goal status: INITIAL  2.  Pt will improve 5x STS to </= 17 sec to demo improved functional LE strength and balance   Baseline: 27.53s B UE Goal status: INITIAL  3.  Pt will demonstrate TUG of </=23 seconds in order to decrease risk of falls and improve functional mobility using LRAD. Baseline: 37.00 seconds w/ RW Goal status: INITIAL  4.  Pt will demonstrate a gait speed of >/=1.36 feet/sec in order to decrease risk for falls. Baseline: 0.76 ft/sec Goal status: INITIAL  5.  Pt will increase BERG balance score to >/=36/56 to demonstrate improved static balance. Baseline: 26/56 (4/10) Goal status: INITIAL  6.  Patient will  improve ABC score to >/= 30% to demonstrate improved confidence with mobility Baseline: 10% Goal status: INITIAL  ASSESSMENT:  CLINICAL IMPRESSION: Arrived no charge  OBJECTIVE IMPAIRMENTS: Abnormal gait, cardiopulmonary status limiting activity, decreased activity tolerance, decreased balance, decreased endurance, decreased knowledge of condition, decreased knowledge of use of DME, decreased mobility, difficulty walking, decreased strength, improper body mechanics, postural dysfunction, and pain.   ACTIVITY LIMITATIONS: carrying, lifting, standing, squatting, stairs, transfers, locomotion level, and caring for others  PARTICIPATION LIMITATIONS: cleaning, interpersonal relationship, driving, shopping, community activity, occupation, and yard work  PERSONAL FACTORS: Age, Fitness, Past/current experiences, Time since onset of injury/illness/exacerbation, and 3+ comorbidities: see above  are also affecting patient's functional outcome.   REHAB POTENTIAL: Fair polypharmacy, time since onset  CLINICAL DECISION MAKING: Evolving/moderate complexity  EVALUATION COMPLEXITY: Moderate  PLAN:  PT  FREQUENCY: 2x/week  PT DURATION: 6 weeks  PLANNED INTERVENTIONS: Therapeutic exercises, Therapeutic activity, Neuromuscular re-education, Balance training, Gait training, Patient/Family education, Self Care, Joint mobilization, Stair training, Vestibular training, Orthotic/Fit training, DME instructions, Aquatic Therapy, Taping, Manual therapy, and Re-evaluation  PLAN FOR NEXT SESSION: Modify HEP prn-static balance, pt would like to progress down to less UE support so she can use rollator instead of RW, work on endurance tasks and increased ambulation distance  Westley Foots, PT, DPT, CBIS 06/30/2022, 1:40 PM

## 2022-07-01 ENCOUNTER — Other Ambulatory Visit: Payer: Self-pay | Admitting: Family Medicine

## 2022-07-01 MED ORDER — SEMAGLUTIDE (1 MG/DOSE) 4 MG/3ML ~~LOC~~ SOPN: 2.0000 mg | PEN_INJECTOR | SUBCUTANEOUS | Status: DC

## 2022-07-01 NOTE — Progress Notes (Signed)
Contacted [facility] and the patient and everything is a go for the patient to restart medication Praluent .  Al Corpus, PharmD notified  Burt Knack, [facility] Clinical Pharmacy Assistant [phone]

## 2022-07-02 ENCOUNTER — Ambulatory Visit: Payer: Medicare HMO | Admitting: Physical Therapy

## 2022-07-07 ENCOUNTER — Ambulatory Visit: Payer: Medicare HMO | Admitting: Physical Therapy

## 2022-07-07 ENCOUNTER — Telehealth: Payer: Self-pay

## 2022-07-07 DIAGNOSIS — R278 Other lack of coordination: Secondary | ICD-10-CM

## 2022-07-07 DIAGNOSIS — R262 Difficulty in walking, not elsewhere classified: Secondary | ICD-10-CM

## 2022-07-07 DIAGNOSIS — R2681 Unsteadiness on feet: Secondary | ICD-10-CM

## 2022-07-07 NOTE — Telephone Encounter (Signed)
I would try taking 1mg  now and another dose 2 days later.  I wouldn't take it on consecutive days.  See if she can tolerate that to get a total of 2mg  per week.  Okay to keep the OV as scheduled if needed. Thanks.

## 2022-07-07 NOTE — Therapy (Signed)
[facility] [address] [place], [place], [address] Phone: [phone]   Fax:  [phone]  Patient Details  Name: Kathleen Hatfield MRN: [id number] Date of Birth: 1955/07/21 Referring Provider:  Elwyn Reach  Encounter Date: 07/07/2022  Patient arrives to PT session and blood glucose remains elevated to 328 at start of session and has been rising steadily despite patient having adjusted medications. Discussed need for patient to get medically managed in order to tolerate PT. Given ongoing uncontrolled levels, patient is going to follow up with endocrinologist and go on medical hold until stabilized. Patient provided business card to call back once managed within 30 days or will require new referral.   Carmelia Bake, PT, DPT 07/07/2022, 1:40 PM  [facility] [address] [place], [place], [address] Phone: [phone]   Fax:  [phone]

## 2022-07-07 NOTE — Telephone Encounter (Signed)
Patient called to update [place] about her sugars and how she was handling the increase of ozempic. Patient states she has not increased her dose yet as she just started on ozempic two weeks ago. This week will be her third injection. She is nervous to use two pens at a time so she is going to take one today and one on Thursday to see if that will help and/or not make her sick. Patient states he sugars are still in the 200-300's and has increased tresbia to 94 units. She has an appt on Thursday scheduled. Are you okay with her spacing the injections out a day apart?

## 2022-07-08 NOTE — Telephone Encounter (Signed)
Patient is aware and will keep f/u for tomorrow.

## 2022-07-09 ENCOUNTER — Ambulatory Visit (INDEPENDENT_AMBULATORY_CARE_PROVIDER_SITE_OTHER): Payer: Medicare HMO | Admitting: Family Medicine

## 2022-07-09 ENCOUNTER — Telehealth: Payer: Self-pay | Admitting: Family Medicine

## 2022-07-09 ENCOUNTER — Telehealth: Payer: Self-pay | Admitting: Pharmacist

## 2022-07-09 ENCOUNTER — Ambulatory Visit: Payer: Medicare HMO

## 2022-07-09 ENCOUNTER — Encounter: Payer: Medicare HMO | Admitting: Pharmacist

## 2022-07-09 ENCOUNTER — Encounter: Payer: Self-pay | Admitting: Family Medicine

## 2022-07-09 VITALS — BP 110/68 | HR 93 | Temp 98.0°F | Ht 65.0 in | Wt 272.0 lb

## 2022-07-09 DIAGNOSIS — E119 Type 2 diabetes mellitus without complications: Secondary | ICD-10-CM

## 2022-07-09 DIAGNOSIS — E1169 Type 2 diabetes mellitus with other specified complication: Secondary | ICD-10-CM | POA: Diagnosis not present

## 2022-07-09 DIAGNOSIS — G2581 Restless legs syndrome: Secondary | ICD-10-CM | POA: Diagnosis not present

## 2022-07-09 DIAGNOSIS — Z794 Long term (current) use of insulin: Secondary | ICD-10-CM | POA: Diagnosis not present

## 2022-07-09 LAB — GLUCOSE, POCT (MANUAL RESULT ENTRY): POC Glucose: 295 mg/dl — AB (ref 70–99)

## 2022-07-09 MED ORDER — ROPINIROLE HCL 4 MG PO TABS: ORAL_TABLET | ORAL | Status: DC

## 2022-07-09 MED ORDER — TRESIBA FLEXTOUCH 200 UNIT/ML ~~LOC~~ SOPN: 98.0000 [IU] | PEN_INJECTOR | Freq: Every day | SUBCUTANEOUS | Status: DC

## 2022-07-09 MED ORDER — SEMAGLUTIDE (1 MG/DOSE) 4 MG/3ML ~~LOC~~ SOPN: 1.0000 mg | PEN_INJECTOR | SUBCUTANEOUS | Status: DC

## 2022-07-09 NOTE — Progress Notes (Signed)
Diabetes:  She felt unsteady after 1st shot of ozempic, had GI upset.  Sx resolved.  Less sx with 2nd dose 1 week later.  3rd dose was 07/06/21, less symptoms with 3rd shot.  She had a dose today, felt shaky in the meantime.  This is in the midst of dose escalation, to get her up to 2 mg/week (taking 1 mg on 2 separate occasions during the week)She checked her sugar this AM, <300. She had cut back on requip in the meantime.  Med list updated.   She has used metolazone last week. Breathing improved since last week with metolazone use last week.  She noted that she felt tremulous this morning after her dose of Ozempic.  Sugar 295, nonfasting.  Checked at office visit.  Discussed with patient.  She felt worse with tighter lungs on pulmicort formoterol and yupelri.  She has been off all for a few weeks now.  She is still using duoneb.  "It felt like something was coating my lungs."  I told her that I would update pulmonary/ Dr. Craige Cotta.    Meds, vitals, and allergies reviewed.   ROS: Per HPI unless specifically indicated in ROS section   Nad ncat Rrr Ctab Trace BLE edema.   On O2 via [place].   No wheeze.   Her exam changed over the course of the interview, for the better.  She was initially feeling generally unwell and tremulous. She clearly felt better as the OV went on, tremor improved and resolved as the OV went on.  She clearly noted that she was feeling better as the interview went on.  40 minutes were devoted to patient care in this encounter (this includes time spent reviewing the patient's file/history, interviewing and examining the patient, counseling/reviewing plan with patient).

## 2022-07-09 NOTE — Patient Instructions (Addendum)
If you feel better in the next 12-24 hours, then reasonable to take a 1mg  dose of ozempic on 07/14/22.  If not feeling better in the meantime, then let me know and don't take the dose.    Reasonable to add 2 units of tresiba daily if your AM sugar is above 170.   If sugar between 120 and 170, then no change in insulin.   If below 120, then cut back 2 units.    If you tolerate the ozempic dose on 07/14/22, then reasonable to give your next 1mg  dose on 07/17/22.    Let me know how it goes and I'll update pulmonary.   Take care.  Glad to see you.

## 2022-07-09 NOTE — Telephone Encounter (Signed)
Contacted Kathleen Hatfield to schedule their annual wellness visit. Appointment made for 07/14/2022.  [facility] Care Guide [facility] AWV TEAM Direct Dial: [phone]

## 2022-07-09 NOTE — Progress Notes (Unsigned)
Care Management & Coordination Services Pharmacy Note  07/09/2022 Name:  Kathleen Hatfield MRN:  [id number] DOB:  [birth year]  Summary: F/u visit -DM: A1c 11.8% (03/2022), worsened from 7.6%, she recently started Novolog in addition to [place], Trulicity; pt is wearing Dexcom and reports few alarms lately (meaning BG stays between 70-250 much of the time). She is interested in switching to Ozempic and reports endocrine has also suggested this to her; discussed differences between Trulicity and Ozempic devices -HFpEF: pt reports swelling, some slight shortness of breath; she has not taken metolazone this week, typically takes it 1-2 times per week; weight is up to 269 lbs (dry weight unclear - pt reports it is 255 lbs however she has not seen that number in over a year, weight was 268 lbs in last 3 office visits, likely need to re-establish dry weight)  -HLD: LDL 96 (02/2022), improved from 208 with Praluent. She switched to Repatha 2024 due to insurance, but could not tolerate it due to fatigue; she did tolerate Pralent   Recommendations/Changes made from today's visit: -Recommend to switch Trulicity to Ozempic 1 mg (lower than equivalent dose of Ozempic given patient's history of multiple medication intolerances) - consulting with endocrine -Recommend to switch Repatha back to Praluent 150 mg - Submitted PA for Praluent now that patient has failed Repatha (CMM key: [id number])  Follow up plan: -Health Concierge will call patient 1 month for medication coordination -Pharmacist follow up televisit scheduled for 3 months -Endocrine appt 10/06/22; Neurology appt 11/26/22    Subjective: Kathleen Hatfield is an 67 y.o. year old female who is a primary patient of Joaquim Nam, MD.  The care coordination team was consulted for assistance with disease management and care coordination needs.    Engaged with patient by telephone for follow up visit.  Patient Care Team: Joaquim Nam, MD as PCP - General  (Family Medicine) Tonny Bollman, MD as PCP - Cardiology (Cardiology) Storm Frisk, MD as Consulting Physician (Pulmonary Disease) Karie Soda, MD as Consulting Physician (General Surgery) [place], Hilton Cork, MD (Inactive) as Consulting Physician (Pulmonary Disease) Kathyrn Sheriff, [facility] as Pharmacist (Pharmacist) Kennon Rounds as Physician Assistant (Cardiology)  Recent office visits: 07/09/22 Dr Para March OV: f/u - off all LA nebulizers, continues duoneb. Take Ozempic 5/7, 5/10 (trying to get to 2 mg/week)  06/26/22 Dr Para March OV: f/u - A1c 11.1%. Increase Ozempic to 2 mg. Increase Tresiba 2 units if AM BG > 150.   05/22/22 Dr Para March OV: f/u - feels worse on Repatha compared to Praluent. Asked about Ozempic. Cut spironolactone in 1/2.  Trial off Repatha. Restart Flonase.  03/12/22 Dr Para March OV: cough - rx Augmentin.  11/07/21 Dr Para March OV: annual - A1c 7.6%. Referred to endocrine and neurology.   Recent consult visits: 05/26/22 PA [facility] (Neurology): tremor - referred to PT. Not taking primidone as directed - take 50 mg 1/2 tab x 1 week then 1 tab thereafter. Decrease ropinirole to 2 mg BID then 2 mg HS. Agree with psychotherapy for depression.  05/01/22 Cardiology TE: updated Furosemide dose to 60 mg BID.  03/31/22 Dr Lonzo Cloud (Endocrine): A1c 11.8%, taking insulin QOD (nonadherent). Change Tresiba to 70 units daily. Start Novolog before meals. Sample of Dexcom G7 given.   03/03/22 Urgent Care: COPD exacerbation. Rx prednisone. Covid positive, rx molnupravir.   02/25/22 PA Tereso Newcomer (Cardiology): f/u HF - glucose 509. D/C fenofibrate x 2 wks (myalgias). QT prolonged today, check labs. D/c  zofran. Transitioning to Repatha 2024 d/t formulary change.  11/26/21 Dr Alben Spittle (Cardiology): f/u HF - increase furosemide to 60 mg BID. Increase fenofibrate to 145 mg. Discussed SGLT2-I - she does have hx of recurrent UTI so deferred.   11/25/21 Dr Tat (Neurology): tremor - not noted  on exam, no evidence essential tremor. COPD meds may be driving tremor. Try primidone 50 mg. For RLS - ropinirole needs to be decreased - trial decrease to 2 mg AM, 4 mg PM x 2 weeks, then 2 mg BID x 2 weeks, then 2 mg only HS. Trial gabapentin 300 mg BID at same time to compensate.    10/30/21 Urgent Care - Pyruria, myalgia. Rx'd Keflex. Urine cx was negative, advised to d/c abx.   09/03/21 Cardiology TE - discuss lipid results on Praluent. Not at goal yet, pt declines additional med. Recheck lipids 6 weeks. 08/27/21-[name],MD (Pulmoanry): f/u sleep apnea-restart perforomist-Use yupelri in your nebulizer daily -previously seen by Dr. Lurena Joiner Tat with [facility]; advised her to contact neurology if her tremor progressf/u 4 months 07/02/21 Margaretmary Dys PharmD (Lipid clinic): LDL 208. Start Praluent 150 mg q14d. 06/04/21-[name],PA(Cardiology)-F/U CHF-Recent creatinine stable. Arrange fasting Lipids(abnormal)  Refer to [facility]. Start ASA 81 mg once daily. 05/14/21 Dr Everardo All (Endocrine):  Establish care DM. Advised mealtime insulin, pt declined. Increased Tresiba to 120 units.  Hospital visits: None in previous 6 months   Objective:  Lab Results  Component Value Date   CREATININE 1.57 (H) 06/26/2022   BUN 30 (H) 06/26/2022   GFR 48.76 (L) 02/27/2021   EGFR 35 (L) 02/25/2022   GFRNONAA 48 (L) 03/31/2021   GFRAA 69 01/19/2020   NA 135 06/26/2022   K 4.3 06/26/2022   CALCIUM 11.2 (H) 06/26/2022   CO2 28 06/26/2022   GLUCOSE 337 (H) 06/26/2022    Lab Results  Component Value Date/Time   HGBA1C 11.1 (H) 06/26/2022 03:20 PM   HGBA1C 11.8 (A) 03/31/2022 09:20 AM   HGBA1C 7.6 (H) 11/07/2021 03:47 PM   GFR 48.76 (L) 02/27/2021 12:16 PM   GFR 38.07 (L) 11/28/2020 03:56 PM    Last diabetic Eye exam:  Lab Results  Component Value Date/Time   HMDIABEYEEXA No Retinopathy 12/22/2019 12:00 AM    Last diabetic Foot exam: No results found for: "HMDIABFOOTEX"   Lab  Results  Component Value Date   CHOL 202 (H) 11/25/2021   HDL 47 11/25/2021   LDLCALC 105 (H) 11/25/2021   LDLDIRECT 96 02/25/2022   TRIG 297 (H) 11/25/2021   CHOLHDL 4.3 11/25/2021       Latest Ref Rng & Units 02/25/2022   10:31 AM 11/25/2021   12:58 PM 11/07/2021    3:47 PM  Hepatic Function  Total Protein 6.0 - 8.5 g/dL 6.7  7.2  6.8   Albumin 3.9 - 4.9 g/dL 4.2  4.6    AST 0 - 40 IU/L 16  21  18    ALT 0 - 32 IU/L 17  18  16    Alk Phosphatase 44 - 121 IU/L 87  98    Total Bilirubin 0.0 - 1.2 mg/dL 0.3  0.5  0.3     Lab Results  Component Value Date/Time   TSH 3.14 11/07/2021 03:47 PM   TSH 2.850 06/09/2019 12:50 PM       Latest Ref Rng & Units 02/25/2022   10:31 AM 11/07/2021    3:47 PM 03/31/2021    6:06 PM  CBC  WBC 3.4 - 10.8  x10E3/uL 9.9  10.5  9.7   Hemoglobin 11.1 - 15.9 g/dL 40.9  81.1  91.4   Hematocrit 34.0 - 46.6 % 44.7  40.5  41.6   Platelets 150 - 450 x10E3/uL 275  325  328     Lab Results  Component Value Date/Time   VD25OH 45.52 06/18/2020 04:52 PM   VD25OH 47.60 06/22/2019 01:59 PM   VITAMINB12 384 09/16/2008 04:20 AM    Clinical ASCVD: No  The 10-year ASCVD risk score (Arnett DK, et al., 2019) is: 13.6%   Values used to calculate the score:     Age: 60 years     Sex: Female     Is Non-Hispanic African American: No     Diabetic: Yes     Tobacco smoker: No     Systolic Blood Pressure: 110 mmHg     Is BP treated: Yes     HDL Cholesterol: 47 mg/dL     Total Cholesterol: 202 mg/dL       7/82/9562    1:30 PM 05/22/2022   12:31 PM 07/10/2021   11:14 AM  Depression screen PHQ 2/9  Decreased Interest 3 2 0  Down, Depressed, Hopeless 3 2 1   PHQ - 2 Score 6 4 1   Altered sleeping 3 3 2   Tired, decreased energy 2 3 1   Change in appetite 1 3 0  Feeling bad or failure about yourself  1 2 0  Trouble concentrating 1 2 0  Moving slowly or fidgety/restless 2 3 0  Suicidal thoughts 0 0 0  PHQ-9 Score 16 20 4   Difficult doing work/chores Extremely  dIfficult Extremely dIfficult Not difficult at all    Social History   Tobacco Use  Smoking Status Never   Passive exposure: Past  Smokeless Tobacco Never   BP Readings from Last 3 Encounters:  07/09/22 110/68  06/26/22 118/78  06/23/22 (!) 142/76   Pulse Readings from Last 3 Encounters:  07/09/22 93  06/26/22 96  05/26/22 74   Wt Readings from Last 3 Encounters:  07/09/22 272 lb (123.4 kg)  06/26/22 272 lb (123.4 kg)  05/26/22 268 lb (121.6 kg)   BMI Readings from Last 3 Encounters:  07/09/22 45.26 kg/m  06/26/22 45.26 kg/m  05/26/22 44.60 kg/m    Allergies  Allergen Reactions   Almond Oil Anaphylaxis, Shortness Of Breath and Swelling   Morphine And Related Shortness Of Breath and Other (See Comments)    Pt. States while in the hospital it affected her breathing, O2 dropped to the 70's   Atorvastatin Other (See Comments)    Leg weakness   Ceclor [Cefaclor] Nausea And Vomiting   Sulfa Antibiotics Nausea And Vomiting   Ciprofloxacin Other (See Comments)    Makes joints and muscles ache   Levaquin [Levofloxacin] Other (See Comments)    Body aches   Losartan Other (See Comments)    Weakness    Primidone     Intolerant, SOB with use.    Repatha [Evolocumab]     Aches   Statins Other (See Comments)    Leg and body weakness    Medications Reviewed Today     Reviewed by Wilburn Cornelia, CMA (Certified Medical Assistant) on 07/09/22 at 1411  Med List Status: <None>   Medication Order Taking? Sig Documenting Provider Last Dose Status Informant  acetaminophen (TYLENOL) 500 MG tablet [id number] Yes Take 500 mg by mouth every 6 (six) hours as needed for moderate pain. [provider] Taking Active Self  albuterol (VENTOLIN HFA) 108 (90 Base) MCG/ACT inhaler [id number] Yes INHALE TWO PUFFS BY MOUTH INTO LUNGS every SIX hours AS NEEDED Joaquim Nam, MD Taking Active   Alirocumab (PRALUENT) 150 MG/ML Ivory Broad [id number] Yes Inject 1 mL (150 mg total) into the  skin every 14 (fourteen) days. Joaquim Nam, MD Taking Active   aspirin EC 81 MG tablet [id number] Yes Take 1 tablet (81 mg total) by mouth daily. Swallow whole. Tereso Newcomer T, PA-C Taking Active Self  BAYER CONTOUR NEXT TEST test strip [id number] Yes 1 each by Other route daily. As directed [provider] Taking Active Self           Med Note Katrinka Blazing, JEFFREY W   Mon May 03, 2017 11:02 PM)    budesonide (PULMICORT) 0.5 MG/2ML nebulizer solution [id number] Yes Take 2 mLs (0.5 mg total) by nebulization 2 (two) times daily. Coralyn Helling, MD Taking Active Self  buPROPion (WELLBUTRIN XL) 300 MG 24 hr tablet [id number] Yes TAKE ONE TABLET BY MOUTH EVERY MORNING Joaquim Nam, MD Taking Active   Cholecalciferol (VITAMIN D-3) 5000 UNITS TABS [id number] Yes Take 5,000 Units by mouth every other day.  [provider] Taking Active Self  colchicine 0.6 MG tablet [id number] Yes TAKE 1 TABLET (0.6 MG TOTAL) BY MOUTH DAILY AS NEEDED (FOR GOUT) Joaquim Nam, MD Taking Active   Continuous Blood Gluc Receiver [facility] G7 RECEIVER) DEVI [id number] Yes by Does not apply route. [provider] Taking Active Self  Continuous Blood Gluc Sensor (DEXCOM G7 SENSOR) MISC [id number] Yes 1 Device by Does not apply route as directed. Apply sensor every 10 days Shamleffer, Konrad Dolores, MD Taking Active   dicyclomine (BENTYL) 20 MG tablet [id number] Yes TAKE 1 TABLET UP TO FOUR TIMES A DAY FOR GI CRAMPING, PAIN, NAUSEA, AND VOMITING AS NEEDED Joaquim Nam, MD Taking Active Self  DULoxetine (CYMBALTA) 60 MG capsule [id number] Yes TAKE ONE CAPSULE BY MOUTH EVERYDAY AT BEDTIME Joaquim Nam, MD Taking Active   estradiol (ESTRACE) 0.1 MG/GM vaginal cream [id number] Yes Place 1 Applicatorful vaginally 3 (three) times a week. [provider] Taking Active Self  FEROSUL 325 (65 Fe) MG tablet [id number] Yes TAKE ONE TABLET BY MOUTH every other evening Joaquim Nam, MD Taking Active    fluticasone [facility]) 50 MCG/ACT nasal spray [id number] Yes Place 2 sprays into both nostrils daily. Marita [place], PA-C Taking Active Self  furosemide (LASIX) 20 MG tablet [id number] Yes Take 3 tablets (60 mg total) by mouth 2 (two) times daily. Tereso Newcomer T, PA-C Taking Active   gabapentin (NEURONTIN) 300 MG capsule [id number] Yes TAKE ONE CAPSULE BY MOUTH EVERYDAY AT BEDTIME May take additional capsule daily if needed Joaquim Nam, MD Taking Active   insulin aspart (NOVOLOG FLEXPEN) 100 UNIT/ML FlexPen [id number] Yes Max daily 45 units Shamleffer, Konrad Dolores, MD Taking Active   insulin degludec (TRESIBA FLEXTOUCH) 200 UNIT/ML FlexTouch Pen [id number] Yes Inject 70-80 Units into the skin daily. Joaquim Nam, MD Taking Active   Insulin Pen Needle (GLOBAL EASE INJECT PEN NEEDLES) 32G X 4 MM MISC [id number] Yes 1 Device by Other route in the morning, at noon, in the evening, and at bedtime. Shamleffer, Konrad Dolores, MD Taking Active   ipratropium-albuterol (DUONEB) 0.5-2.5 (3) MG/3ML SOLN [id number] Yes Take 3 mLs by nebulization every 6 (six) hours as needed (wheezing). Coralyn Helling, MD Taking Active Self  ketoconazole (NIZORAL) 2 % cream [id number] Yes APPLY TO AFFECTED AREA TWICE A DAY  Joaquim Nam, MD Taking Active   meloxicam [facility]) 7.5 MG tablet [id number] Yes Take 1 tablet (7.5 mg total) by mouth daily as needed for pain. Joaquim Nam, MD Taking Active   metolazone (ZAROXOLYN) 5 MG tablet [id number] Yes TAKE 1 TABLET BY MOUTH EVERY DAY AS NEEDED Joaquim Nam, MD Taking Active   [facility] LANCETS MISC [id number] Yes 1 each by Other route. As directed [provider] Taking Active Self           Med Note Sharlynn Oliphant Oct 21, 2015  3:31 PM)    nitrofurantoin (MACRODANTIN) 50 MG capsule [id number] Yes Take 50 mg by mouth daily. [provider] Taking Active Self           Med Note Para March, Dwana Curd   Fri Nov 07, 2021  2:27 PM)    nystatin  (MYCOSTATIN/NYSTOP) powder [id number] Yes Apply 1 application topically 3 (three) times daily. Joaquim Nam, MD Taking Active Self  ondansetron (ZOFRAN) 4 MG tablet [id number] Yes TAKE ONE TABLET BY MOUTH every EIGHT hours AS NEEDED FOR NAUSEA AND VOMITING Joaquim Nam, MD Taking Active   OXYGEN [id number] Yes Inhale 2 L into the lungs continuous. [provider] Taking Active Self  pantoprazole (PROTONIX) 40 MG tablet [id number] Yes TAKE ONE TABLET BY MOUTH EVERY MORNING Joaquim Nam, MD Taking Active   polyethylene glycol [facility] / GLYCOLAX) packet [id number] Yes Take 17 g by mouth daily as needed for mild constipation. [provider] Taking Active Self  potassium chloride (MICRO-K) 10 MEQ CR capsule [id number] Yes Take 5 capsules (50 mEq total) by mouth daily. Joaquim Nam, MD Taking Active   revefenacin [facility]) 175 MCG/3ML nebulizer solution [id number] Yes Inhale one vial in nebulizer once daily. Do not mix with other nebulized medications. Coralyn Helling, MD Taking Active   rOPINIRole (REQUIP) 4 MG tablet [id number] Yes TAKE 0.5 TABLET BY MOUTH EVERY MORNING and TAKE 1 TABLET BY MOUTH EVERYDAY AT BEDTIME Joaquim Nam, MD Taking Active   Semaglutide, 1 MG/DOSE, 4 MG/3ML SOPN [id number] Yes Inject 2 mg as directed once a week. Joaquim Nam, MD Taking Active   simethicone (GAS-X) 80 MG chewable tablet [id number] Yes Chew 1 tablet (80 mg total) by mouth every 6 (six) hours as needed for flatulence. Joaquim Nam, MD Taking Active   spironolactone (ALDACTONE) 25 MG tablet [id number] Yes Take 0.5 tablets (12.5 mg total) by mouth daily. Joaquim Nam, MD Taking Active   traMADol Janean Sark) 50 MG tablet [id number] Yes Take 1 tablet (50 mg total) by mouth 3 (three) times daily as needed. Joaquim Nam, MD Taking Active   UNABLE TO FIND [id number] Yes CPAP- At bedtime [provider] Taking Active Self            Patient Active Problem List   Diagnosis  Date Noted   Diabetes mellitus (HCC) 03/31/2022   Myalgia 02/25/2022   Prolonged Q-T interval on ECG 02/25/2022   Aortic atherosclerosis (HCC) 06/04/2021   Preoperative cardiovascular examination 06/04/2021   Hyperglycemia 02/14/2021   Rash 09/27/2020   COPD exacerbation (HCC) 09/21/2020   Acute exacerbation of COPD with asthma (HCC) 09/19/2020   Mixed diabetic hyperlipidemia associated with type 2 diabetes mellitus (HCC) 09/19/2020   Pain of foot 06/20/2020   Medicare annual wellness visit, initial 06/20/2020   Constipation 06/11/2020   Diverticulosis of colon 06/11/2020   Irritable bowel syndrome 06/11/2020   Periumbilical pain 06/11/2020  RLS (restless legs syndrome)    Asthma exacerbation 01/08/2020   Muscle weakness 01/08/2020   Grade I diastolic dysfunction 01/08/2020   Drug-induced myopathy 10/10/2019   Advance care planning 06/14/2019   RLQ abdominal pain 06/14/2019   COPD (chronic obstructive pulmonary disease) (HCC) 06/14/2019   Leukocytosis 06/14/2019   Hypercalcemia 06/14/2019   Gout 06/14/2019   COPD with acute exacerbation (HCC) 05/03/2017   CKD (chronic kidney disease) stage 3, GFR 30-59 ml/min (HCC)    Hypersomnia with sleep apnea 09/16/2015   Fatigue 06/07/2015   Morbid obesity (HCC) 06/07/2015   Cough 04/18/2015   Chronic diastolic CHF 05/2014   (HFpEF) heart failure with preserved ejection fraction (HCC)    Pneumonia of both lower lobes due to infectious organism 02/09/2013   OSA on CPAP 08/23/2012   Essential hypertension    Depression    GERD without esophagitis    Hyperlipidemia    Ulcerative colitis (HCC)    Patellar tendinitis 05/25/2011   Knee pain 05/25/2011   Myofascial pain 05/25/2011   Cervicalgia 05/25/2011    Immunization History  Administered Date(s) Administered   Fluad Quad(high Dose 65+) 12/31/2021   Influenza Split 12/01/2012, 01/18/2017   Influenza Whole 12/25/2011   Influenza,inj,Quad PF,6+ Mos 11/27/2013, 12/08/2014,  12/11/2015   Influenza-Unspecified 12/07/2017   Janssen (J&J) SARS-COV-2 Vaccination 06/15/2019   Pneumococcal Conjugate-13 02/17/2017   Pneumococcal Polysaccharide-23 11/27/2013   Tdap 04/01/2010   Zoster Recombinat (Shingrix) 10/06/2021, 12/31/2021    SDOH:  (Social Determinants of Health) assessments and interventions performed: No SDOH Interventions    Flowsheet Row Office Visit from 06/26/2022 in [facility] [place] Office Visit from 05/22/2022 in [facility] [place] [facility] Clinical Support from 07/10/2021 in [facility] Chronic Care Management from 09/02/2020 in [facility] [place] [facility] Chronic Care Management from 08/12/2020 in [facility] [place] [facility] Chronic Care Management from 07/29/2020 in [facility] [place] [facility] [place]  SDOH Interventions        Food Insecurity Interventions -- -- Intervention Not Indicated -- Intervention Not Indicated --  Housing Interventions -- -- Intervention Not Indicated -- -- --  Transportation Interventions -- -- Intervention Not Indicated -- Intervention Not Indicated --  Depression Interventions/Treatment  Counseling Counseling -- -- Medication --  Financial Strain Interventions -- -- Intervention Not Indicated Intervention Not Indicated -- Intervention Not Indicated  Physical Activity Interventions -- -- Intervention Not Indicated -- Other (Comments)  [Discussed  chair exercises.  Exercise booklet mailed to patient.] --  Stress Interventions -- -- Intervention Not Indicated -- -- --  Social Connections Interventions -- -- Intervention Not Indicated -- -- --      SDOH Screenings   Food Insecurity: No Food Insecurity (07/10/2021)  Housing: Low Risk  (07/10/2021)  Transportation Needs: No Transportation Needs (07/10/2021)  Alcohol Screen: Low Risk  (07/10/2021)  Depression (PHQ2-9): High Risk (06/26/2022)  Financial  Resource Strain: Low Risk  (07/10/2021)  Physical Activity: Inactive (07/10/2021)  Social Connections: Moderately Isolated (07/10/2021)  Stress: No Stress Concern Present (07/10/2021)  Tobacco Use: Low Risk  (07/09/2022)    Medication Assistance: None required.  Patient affirms current coverage meets needs. - LIS  Medication Access: Within the past 30 days, how often has patient missed a dose of medication? 0 Is a pillbox or other method used to improve adherence? Yes  Factors that may affect medication adherence? no barriers identified Are meds synced by current  pharmacy? Yes  Are meds delivered by current pharmacy? Yes  Does patient experience delays in picking up medications due to transportation concerns? No   [facility] Reviewed: Is patient disadvantaged to use [facility]?: No  Current Rx insurance plan: [employer] Name and location of Current pharmacy:  [facility] [phone] - [place], [place] - [address] [place] [address] Phone: [phone] Fax: [phone]  [facility] - [place], [place] - [address] [place] [address] Phone: [phone] Fax: [phone]  [facility] - [place], [place] - [address] [place] [address] Phone: [phone] Fax: [phone]  [facility] services reviewed with patient today?: Yes  30 day pill packs: Delivered 06/08/22 Bupropion HCL XL 300mg - take 1 tablet at evening meal Cholecalciferol (vit D3)-5000units- take 1 tablet breakfast every other day  Ropinirole 4mg - take 1 tablet breakfast 1 tablet bedtime  Pantoprazole 40mg - take 1 tablet bedtime Spironolactone 25mg - take 1 tablet breakfast Furosemide 20mg  take 3 tablets breakfast and dinner Duloxetine 60mg - take 1 tablet breakfast Ferrous Sulfate 325mg - take 1 tablet evening meal every other day  Gabapentin 300mg -take 1 tablet bedtime Aspirin 81mg  -take 1 tablet at  breakfast  VIAL medications: Albuterol  108 mcg/act inhaler- 2 puffs into lungs every 6 hours as needed Gabapentin 300mg -take extra tablet as needed at bedtime   Patient declined the following medications this month: Cetirizine, gabapentin (extras)  Compliance/Adherence/Medication fill history: Care Gaps: DEXA (never done) Foot exam (11/28/21) Eye exam (12/21/21) UACR (never done)  Star-Rating Drugs: Trulicity - PDC 94%    ASSESSMENT / PLAN:  Hyperlipidemia: (LDL goal < 70) -Improved - LDL 96 (02/2022), improved from 208 (06/2021) with Praluent; TRIG 297 improved from > 500 -Pt was recently switched from Praluent to Repatha due to insurance coverage, she could not tolerate Repatha, recently stopped due to side effects -High ASCVD risk (25%) - reduced to 16% with improvement in cholesterol -Current treatment: Praluent 150 mg q14 days - Appropriate, Effective, Safe, Accessible -Medications previously tried: Multiple statins (atorvastatin, others) - leg weakness; Vascepa (cost), fenofibrate  -Educated on Cholesterol goals; -Discussed switching back to Praluent - now that she has failed [employer] may cover it. Submitted PA to [employer] - Key: [id number] -Recommend to switch Repatha to Praluent 150 mg - awaiting insurance approval  Diabetes (A1c goal <7%) -Uncontrolled- A1c 11.8% (03/2022) worsened from 7.6%; pt is interested in switching to Ozempic -Followed with Dr Lonzo Cloud (established care 03/2022) -Dx 2017, insulin since 2020. Hx of chronic UTIs, would avoid SGLT2-inhibitor -Current home glucose readings: via Dexcom G7; pt reports alarms have not been going off much lately  -glucose 200s-300s most of the time, she can get it down to 172 -Current medications: Trulicity 4.5 mg weekly (Thurs) - Appropriate, Query Effective Tresiba U200 - 98 units daily (0.79 u/kg) -Appropriate, Query Effective Novolog sliding scale AC - typically 2-3 units with meals Dexcom G7 w/ reader -  Appropriate, Effective, Safe, Accessible -Medications previously tried: Jardiance, Victoza, metformin -Current meal patterns: The patient reports she is on a salt free, sugar free, gluten free diet but had a hard time sticking to this. She tries to limit portions.  -Discussed importance of compliance with insulin; she may be a candidate for Mounjaro, but supply issues limit this right now; it would be reasonable to switch to Ozempic, discussed differences between Trulicity and Ozempic device, pt has history of intolerance to changing medication so would recommend  to start Ozempic at lower than equivalent dose  -Recommend to stop Trulicity and start Ozempic 1 mg weekly - consulting with endocrine  Heart Failure (Goal: manage symptoms and prevent exacerbations) -Query controlled - pt reports swelling, some slight shortness of breath; weight is up to 269#; she has not taken metolazone this week, typically takes it 1-2 times per week -Last BP: "good" after cutting spiro in half -Daily weight: pt reports dry wt 255#, admits she has not seen that in "a while"; per review in office weight has been between 260-265# for the last year; pt reports wt today was 269#, likely need to establish new dry weight -Last ejection fraction: 70% (Date: 05/2019) -HF type: HFpEF (EF > 50%) -NYHA Class: III (marked limitation of activity) -Followed by Dr. Excell Seltzer -Current treatment: Spironolactone 25 mg - 1/2 tab daily AM - Appropriate, Effective, Safe, Accessible Furosemide 20 mg - 3 tab BID- Appropriate, Effective, Safe, Accessible Potassium 10 mEq - 5 tab daily- Appropriate, Effective, Safe, Accessible Metolazone 2.5 mg PRN wt gain - Appropriate, Effective, Query Safe,  -Medications previously tried: torsemide, bumetanide, losartan  -Reviewed concept of "dry weight" and use of metolazone as "diuretic booster" when weight is > 3lbs above dry weight; likely need to establish new dry weight as patient has not been 255# in  over a year;  -Recommended to continue current medication  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) -Pt currently being treated for COPD exacerbation/COVID -she wears Oxygen at night and PRN during the day;  -Followed by Dr Craige Cotta, [facility]. Pt does not follow up with pulmonology as frequently as recommended.  -Pulmonary function testing: 2021 FEV1 42% predicted -Gold Grade: Gold 3 (FEV1 30-49%) -Current COPD Classification:  E (high sx, >2 exacerbations/yr) -Exacerbations requiring treatment in last 6 months: yes, multiple -Compliant with CPAP nightly -Current regimen: Albuterol/ipratropium nebulizer - takes 2-3 times per day as needed - Appropriate, Effective, Safe, Accessible Albuterol inhaler - 2 puffs every 6 hours PRN - Appropriate, Effective, Safe, Accessible -Medications previously tried: Yupelri, Budesonide, Performist (cost), Advair, Incruse, Trelegy (pt does not like powder inhalers) -Recommended to continue current medication; follow up with pulmonology more regularly  Depression/Anxiety/Pain(Goal: Control symptoms) -Controlled, stable per patient -Current treatment: Bupropion 300 mg daily - Appropriate, Effective, Safe, Accessible Duloxetine 60 mg daily - Appropriate, Effective, Safe, Accessible Gabapentin 300 mg HS - Appropriate, Effective, Safe, Accessible -Medications previously tried/failed: none -Concern that ropinirole may be causing her to fall asleep suddenly during the day. This has been discussed with patient before and per chart records, she was unable to tolerate dose reductions. Most recently attempted taper 11/2021 per neurology instructions -Recommend to continue current medication  RLS / tremor (Goal: manage symptoms) -Follows with neurology; recently referred to PT for tremor; pt reports she takes less primidone than prescribed because of side effects (1/4 tablet PRN) -Pt was advised to taper ropinirole to lower dose  in 11/2021 per neurlogy (current dose is Parkinson's dose, RLS dose is lower) -Taper plan per neuro: 2 mg/4mg  x 2 weeks, 2mg  BID x 2 weeks, 2 mg HS -Current treatment  Ropinirole 4 mg BID (RLS) - Appropriate, Effective, Query Safe Gabapentin 100 mg - 3 caps HS - Appropriate, Effective, Safe, Accessible -Medications previously tried: primidone -Reviewed taper plan for ropinirole with patient -Recommended to continue current medication; Advised to attend PT sessions as scheduled   Al Corpus, PharmD, BCACP Clinical Pharmacist [facility] [phone]

## 2022-07-09 NOTE — Telephone Encounter (Signed)
Care Management & Coordination Services Outreach Note  07/09/2022 Name: Kathleen Hatfield MRN: [id number] DOB: 12-Feb-1956  Referred by: Joaquim Nam, MD  Patient had a phone appointment scheduled with clinical pharmacist today.  An unsuccessful telephone outreach was attempted today. The patient was referred to the pharmacist for assistance with medications, care management and care coordination.   Patient will NOT be penalized in any way for missing a Care Management & Coordination Services appointment. The no-show fee does not apply.  If possible, a message was left to return call to: [phone] or to [facility].  Al Corpus, PharmD, BCACP Clinical Pharmacist [facility] at [facility] [phone]

## 2022-07-12 NOTE — Assessment & Plan Note (Signed)
She was able to cut Requip back to 4 mg at night.  Would continue with that for now.

## 2022-07-12 NOTE — Assessment & Plan Note (Signed)
She still has significant sugar elevation and if she were not on Ozempic then I would suspect she would have much higher insulin requirements.  She has not felt well with each injection of the medication but her symptoms have diminished with each injection.  She clearly felt better as the exam went along today.  I suspect her recent symptoms were related to Ozempic.  She still has a sugar of 295 at the office visit so she is still going to need treatment.  We talked about options.  Since her symptoms with Ozempic dosing appear to be decreasing with each subsequent dose, I would continue with 1 mg dosing for now but she is going to try to space the 1 mg dose out to 2 separate times of the week, 3 days apart.  The goal is to eventually work her up to a 2 mg dose.  In the meantime, continue [place] and NovoLog.   If continuing to feel better in the next 12-24 hours, then reasonable to take a 1mg  dose of ozempic on 07/14/22.  If not feeling better in the meantime, then let me know and don't take that dose.    Reasonable to add 2 units of tresiba daily if AM sugar is above 170.   If sugar between 120 and 170, then no change in insulin.   If below 120, then cut back 2 units.    If tolerating the ozempic dose on 07/14/22, then reasonable to give your next 1mg  dose on 07/17/22.

## 2022-07-14 ENCOUNTER — Ambulatory Visit (INDEPENDENT_AMBULATORY_CARE_PROVIDER_SITE_OTHER): Payer: Medicare HMO

## 2022-07-14 ENCOUNTER — Ambulatory Visit: Payer: Medicare HMO

## 2022-07-14 VITALS — Ht 65.0 in | Wt 258.0 lb

## 2022-07-14 DIAGNOSIS — Z0000 Encounter for general adult medical examination without abnormal findings: Secondary | ICD-10-CM

## 2022-07-14 DIAGNOSIS — Z1231 Encounter for screening mammogram for malignant neoplasm of breast: Secondary | ICD-10-CM | POA: Diagnosis not present

## 2022-07-14 NOTE — Patient Instructions (Signed)
Ms. Covarrubias , Thank you for taking time to come for your Medicare Wellness Visit. I appreciate your ongoing commitment to your health goals. Please review the following plan we discussed and let me know if I can assist you in the future.   These are the goals we discussed:  Goals      Increase physical activity     Loose weight      Manage My Medicine     Timeframe:  Long-Range Goal Priority:  High Start Date:        05/23/21                     Expected End Date:   05/24/22                    Follow Up Date April 2023   - call for medicine refill 2 or 3 days before it runs out - call if I am sick and can't take my medicine - keep a list of all the medicines I take; vitamins and herbals too -Utilize [facility] for medication synchronization, packaging and delivery     Why is this important?   These steps will help you keep on track with your medicines.   Notes:      Patient Stated     Get knees fixed.        This is a list of the screening recommended for you and due dates:  Health Maintenance  Topic Date Due   Yearly kidney health urinalysis for diabetes  Never done   DTaP/Tdap/Td vaccine (2 - Td or Tdap) 04/01/2020   Eye exam for diabetics  12/21/2020   Complete foot exam   11/28/2021   Pneumonia Vaccine (3 of 3 - PPSV23 or PCV20) 02/17/2022   Flu Shot  10/08/2022   Hemoglobin A1C  12/26/2022   Yearly kidney function blood test for diabetes  06/26/2023   Medicare Annual Wellness Visit  07/14/2023   Mammogram  08/13/2023   Colon Cancer Screening  10/11/2023   DEXA scan (bone density measurement)  Completed   Hepatitis C Screening: USPSTF Recommendation to screen - Ages 62-79 yo.  Completed   Zoster (Shingles) Vaccine  Completed   HPV Vaccine  Aged Out   COVID-19 Vaccine  Discontinued   Opioid Pain Medicine Management Opioids are powerful medicines that are used to treat moderate to severe pain. When used for short periods of time, they can help you to: Sleep  better. Do better in physical or occupational therapy. Feel better in the first few days after an injury. Recover from surgery. Opioids should be taken with the supervision of a trained health care provider. They should be taken for the shortest period of time possible. This is because opioids can be addictive, and the longer you take opioids, the greater your risk of addiction. This addiction can also be called opioid use disorder. What are the risks? Using opioid pain medicines for longer than 3 days increases your risk of side effects. Side effects include: Constipation. Nausea and vomiting. Breathing difficulties (respiratory depression). Drowsiness. Confusion. Opioid use disorder. Itching. Taking opioid pain medicine for a long period of time can affect your ability to do daily tasks. It also puts you at risk for: Motor vehicle crashes. Depression. Suicide. Heart attack. Overdose, which can be life-threatening. What is a pain treatment plan? A pain treatment plan is an agreement between you and your health care provider. Pain is unique to each  person, and treatments vary depending on your condition. To manage your pain, you and your health care provider need to work together. To help you do this: Discuss the goals of your treatment, including how much pain you might expect to have and how you will manage the pain. Review the risks and benefits of taking opioid medicines. Remember that a good treatment plan uses more than one approach and minimizes the chance of side effects. Be honest about the amount of medicines you take and about any drug or alcohol use. Get pain medicine prescriptions from only one health care provider. Pain can be managed with many types of alternative treatments. Ask your health care provider to refer you to one or more specialists who can help you manage pain through: Physical or occupational therapy. Counseling (cognitive behavioral therapy). Good  nutrition. Biofeedback. Massage. Meditation. Non-opioid medicine. Following a gentle exercise program. How to use opioid pain medicine Taking medicine Take your pain medicine exactly as told by your health care provider. Take it only when you need it. If your pain gets less severe, you may take less than your prescribed dose if your health care provider approves. If you are not having pain, do nottake pain medicine unless your health care provider tells you to take it. If your pain is severe, do nottry to treat it yourself by taking more pills than instructed on your prescription. Contact your health care provider for help. Write down the times when you take your pain medicine. It is easy to become confused while on pain medicine. Writing the time can help you avoid overdose. Take other over-the-counter or prescription medicines only as told by your health care provider. Keeping yourself and others safe  While you are taking opioid pain medicine: Do not drive, use machinery, or power tools. Do not sign legal documents. Do not drink alcohol. Do not take sleeping pills. Do not supervise children by yourself. Do not do activities that require climbing or being in high places. Do not go to a lake, river, ocean, spa, or swimming pool. Do not share your pain medicine with anyone. Keep pain medicine in a locked cabinet or in a secure area where pets and children cannot reach it. Stopping your use of opioids If you have been taking opioid medicine for more than a few weeks, you may need to slowly decrease (taper) how much you take until you stop completely. Tapering your use of opioids can decrease your risk of symptoms of withdrawal, such as: Pain and cramping in the abdomen. Nausea. Sweating. Sleepiness. Restlessness. Uncontrollable shaking (tremors). Cravings for the medicine. Do not attempt to taper your use of opioids on your own. Talk with your health care provider about how to do  this. Your health care provider may prescribe a step-down schedule based on how much medicine you are taking and how long you have been taking it. Getting rid of leftover pills Do not save any leftover pills. Get rid of leftover pills safely by: Taking the medicine to a prescription take-back program. This is usually offered by the county or law enforcement. Bringing them to a pharmacy that has a drug disposal container. Flushing them down the toilet. Check the label or package insert of your medicine to see whether this is safe to do. Throwing them out in the trash. Check the label or package insert of your medicine to see whether this is safe to do. If it is safe to throw it out, remove the medicine from the  original container, put it into a sealable bag or container, and mix it with used coffee grounds, food scraps, dirt, or cat litter before putting it in the trash. Follow these instructions at home: Activity Do exercises as told by your health care provider. Avoid activities that make your pain worse. Return to your normal activities as told by your health care provider. Ask your health care provider what activities are safe for you. General instructions You may need to take these actions to prevent or treat constipation: Drink enough fluid to keep your urine pale yellow. Take over-the-counter or prescription medicines. Eat foods that are high in fiber, such as beans, whole grains, and fresh fruits and vegetables. Limit foods that are high in fat and processed sugars, such as fried or sweet foods. Keep all follow-up visits. This is important. Where to find support If you have been taking opioids for a long time, you may benefit from receiving support for quitting from a local support group or counselor. Ask your health care provider for a referral to these resources in your area. Where to find more information Centers for Disease Control and Prevention (CDC): FootballExhibition.com.br [place] Food and  Drug Administration (FDA): PumpkinSearch.com.ee Get help right away if: You may have taken too much of an opioid (overdosed). Common symptoms of an overdose: Your breathing is slower or more shallow than normal. You have a very slow heartbeat (pulse). You have slurred speech. You have nausea and vomiting. Your pupils become very small. You have other potential symptoms: You are very confused. You faint or feel like you will faint. You have cold, clammy skin. You have blue lips or fingernails. You have thoughts of harming yourself or harming others. These symptoms may represent a serious problem that is an emergency. Do not wait to see if the symptoms will go away. Get medical help right away. Call your local emergency services (911 in the [place] Do not drive yourself to the hospital.  If you ever feel like you may hurt yourself or others, or have thoughts about taking your own life, get help right away. Go to your nearest emergency department or: Call your local emergency services (911 in the [place] Call the [facility] (318 081 5731 in the [place] Call a suicide crisis helpline, such as the National Suicide Prevention Lifeline at (902) 277-9539 or 988 in the [place] This is open 24 hours a day in the [place] Text the Crisis Text Line at (312)773-3476 (in the [place] Summary Opioid medicines can help you manage moderate to severe pain for a short period of time. A pain treatment plan is an agreement between you and your health care provider. Discuss the goals of your treatment, including how much pain you might expect to have and how you will manage the pain. If you think that you or someone else may have taken too much of an opioid, get medical help right away. This information is not intended to replace advice given to you by your health care provider. Make sure you discuss any questions you have with your health care provider. Document Revised: 09/18/2020 Document Reviewed:  06/05/2020 Elsevier Patient Education  2023 Elsevier Inc.   Advanced directives: in chart  Conditions/risks identified: Aim for 30 minutes of exercise or brisk walking, 6-8 glasses of water, and 5 servings of fruits and vegetables each day.   Next appointment: Follow up in one year for your annual wellness visit 07/15/23 @ 11:15 televisit   Preventive Care 65 Years and Older, Female  Preventive care refers to lifestyle choices and visits with your health care provider that can promote health and wellness. What does preventive care include? A yearly physical exam. This is also called an annual well check. Dental exams once or twice a year. Routine eye exams. Ask your health care provider how often you should have your eyes checked. Personal lifestyle choices, including: Daily care of your teeth and gums. Regular physical activity. Eating a healthy diet. Avoiding tobacco and drug use. Limiting alcohol use. Practicing safe sex. Taking low-dose aspirin every day. Taking vitamin and mineral supplements as recommended by your health care provider. What happens during an annual well check? The services and screenings done by your health care provider during your annual well check will depend on your age, overall health, lifestyle risk factors, and family history of disease. Counseling  Your health care provider may ask you questions about your: Alcohol use. Tobacco use. Drug use. Emotional well-being. Home and relationship well-being. Sexual activity. Eating habits. History of falls. Memory and ability to understand (cognition). Work and work Astronomer. Reproductive health. Screening  You may have the following tests or measurements: Height, weight, and BMI. Blood pressure. Lipid and cholesterol levels. These may be checked every 5 years, or more frequently if you are over 23 years old. Skin check. Lung cancer screening. You may have this screening every year starting at age  79 if you have a 30-pack-year history of smoking and currently smoke or have quit within the past 15 years. Fecal occult blood test (FOBT) of the stool. You may have this test every year starting at age 78. Flexible sigmoidoscopy or colonoscopy. You may have a sigmoidoscopy every 5 years or a colonoscopy every 10 years starting at age 28. Hepatitis C blood test. Hepatitis B blood test. Sexually transmitted disease (STD) testing. Diabetes screening. This is done by checking your blood sugar (glucose) after you have not eaten for a while (fasting). You may have this done every 1-3 years. Bone density scan. This is done to screen for osteoporosis. You may have this done starting at age 33. Mammogram. This may be done every 1-2 years. Talk to your health care provider about how often you should have regular mammograms. Talk with your health care provider about your test results, treatment options, and if necessary, the need for more tests. Vaccines  Your health care provider may recommend certain vaccines, such as: Influenza vaccine. This is recommended every year. Tetanus, diphtheria, and acellular pertussis (Tdap, Td) vaccine. You may need a Td booster every 10 years. Zoster vaccine. You may need this after age 7. Pneumococcal 13-valent conjugate (PCV13) vaccine. One dose is recommended after age 13. Pneumococcal polysaccharide (PPSV23) vaccine. One dose is recommended after age 51. Talk to your health care provider about which screenings and vaccines you need and how often you need them. This information is not intended to replace advice given to you by your health care provider. Make sure you discuss any questions you have with your health care provider. Document Released: 03/22/2015 Document Revised: 11/13/2015 Document Reviewed: 12/25/2014 Elsevier Interactive Patient Education  2017 [employer].  Fall Prevention in the Home Falls can cause injuries. They can happen to people of all ages.  There are many things you can do to make your home safe and to help prevent falls. What can I do on the outside of my home? Regularly fix the edges of walkways and driveways and fix any cracks. Remove anything that might make you trip as  you walk through a door, such as a raised step or threshold. Trim any bushes or trees on the path to your home. Use bright outdoor lighting. Clear any walking paths of anything that might make someone trip, such as rocks or tools. Regularly check to see if handrails are loose or broken. Make sure that both sides of any steps have handrails. Any raised decks and porches should have guardrails on the edges. Have any leaves, snow, or ice cleared regularly. Use sand or salt on walking paths during winter. Clean up any spills in your garage right away. This includes oil or grease spills. What can I do in the bathroom? Use night lights. Install grab bars by the toilet and in the tub and shower. Do not use towel bars as grab bars. Use non-skid mats or decals in the tub or shower. If you need to sit down in the shower, use a plastic, non-slip stool. Keep the floor dry. Clean up any water that spills on the floor as soon as it happens. Remove soap buildup in the tub or shower regularly. Attach bath mats securely with double-sided non-slip rug tape. Do not have throw rugs and other things on the floor that can make you trip. What can I do in the bedroom? Use night lights. Make sure that you have a light by your bed that is easy to reach. Do not use any sheets or blankets that are too big for your bed. They should not hang down onto the floor. Have a firm chair that has side arms. You can use this for support while you get dressed. Do not have throw rugs and other things on the floor that can make you trip. What can I do in the kitchen? Clean up any spills right away. Avoid walking on wet floors. Keep items that you use a lot in easy-to-reach places. If you need  to reach something above you, use a strong step stool that has a grab bar. Keep electrical cords out of the way. Do not use floor polish or wax that makes floors slippery. If you must use wax, use non-skid floor wax. Do not have throw rugs and other things on the floor that can make you trip. What can I do with my stairs? Do not leave any items on the stairs. Make sure that there are handrails on both sides of the stairs and use them. Fix handrails that are broken or loose. Make sure that handrails are as long as the stairways. Check any carpeting to make sure that it is firmly attached to the stairs. Fix any carpet that is loose or worn. Avoid having throw rugs at the top or bottom of the stairs. If you do have throw rugs, attach them to the floor with carpet tape. Make sure that you have a light switch at the top of the stairs and the bottom of the stairs. If you do not have them, ask someone to add them for you. What else can I do to help prevent falls? Wear shoes that: Do not have high heels. Have rubber bottoms. Are comfortable and fit you well. Are closed at the toe. Do not wear sandals. If you use a stepladder: Make sure that it is fully opened. Do not climb a closed stepladder. Make sure that both sides of the stepladder are locked into place. Ask someone to hold it for you, if possible. Clearly mark and make sure that you can see: Any grab bars or handrails. First  and last steps. Where the edge of each step is. Use tools that help you move around (mobility aids) if they are needed. These include: Canes. Walkers. Scooters. Crutches. Turn on the lights when you go into a dark area. Replace any light bulbs as soon as they burn out. Set up your furniture so you have a clear path. Avoid moving your furniture around. If any of your floors are uneven, fix them. If there are any pets around you, be aware of where they are. Review your medicines with your doctor. Some medicines can  make you feel dizzy. This can increase your chance of falling. Ask your doctor what other things that you can do to help prevent falls. This information is not intended to replace advice given to you by your health care provider. Make sure you discuss any questions you have with your health care provider. Document Released: 12/20/2008 Document Revised: 08/01/2015 Document Reviewed: 03/30/2014 Elsevier Interactive Patient Education  2017 [employer].

## 2022-07-14 NOTE — Progress Notes (Signed)
I connected with  Kathleen Hatfield on 07/14/22 by a audio enabled telemedicine application and verified that I am speaking with the correct person using two identifiers.  Patient Location: Home  Provider Location: Home Office  I discussed the limitations of evaluation and management by telemedicine. The patient expressed understanding and agreed to proceed.  Subjective:   Kathleen Hatfield is a 67 y.o. female who presents for Medicare Annual (Subsequent) preventive Hatfield.  Review of Systems      Cardiac Risk Factors include: advanced age (>32men, >35 women)     Objective:    Today's Vitals   07/14/22 1330  Weight: 258 lb (117 kg)  Height: 5\' 5"  (1.651 m)   Body mass index is 42.93 kg/m.     07/14/2022    1:59 PM 06/09/2022    1:15 PM 05/26/2022    2:27 PM 03/03/2022    3:20 PM 10/10/2021    8:31 AM 07/10/2021   11:05 AM 02/14/2021    6:00 PM  Advanced Directives  Does Patient Have a Medical Advance Directive? Yes Yes Yes Yes Yes Yes Yes  Type of Estate agent of [place];Living will Healthcare Power of [employer] Power of [employer] Power of [place];Living will;Out of facility DNR (pink MOST or yellow form) Living will;Healthcare Power of [employer] Power of Attorney Living will;Healthcare Power of Attorney  Does patient want to make changes to medical advance directive? No - Patient declined  No - Patient declined    No - Patient declined  Copy of Healthcare Power of Attorney in Chart? Yes - validated most recent copy scanned in chart (See row information)  No - copy requested  No - copy requested No - copy requested No - copy requested    Current Medications (verified) Outpatient Encounter Medications as of 07/14/2022  Medication Sig   acetaminophen (TYLENOL) 500 MG tablet Take 500 mg by mouth every 6 (six) hours as needed for moderate pain.   albuterol (VENTOLIN HFA) 108 (90 Base) MCG/ACT inhaler INHALE TWO PUFFS BY MOUTH INTO LUNGS  every SIX hours AS NEEDED   Alirocumab (PRALUENT) 150 MG/ML SOAJ Inject 1 mL (150 mg total) into the skin every 14 (fourteen) days.   aspirin EC 81 MG tablet Take 1 tablet (81 mg total) by mouth daily. Swallow whole.   BAYER CONTOUR NEXT TEST test strip 1 each by Other route daily. As directed   buPROPion (WELLBUTRIN XL) 300 MG 24 hr tablet TAKE ONE TABLET BY MOUTH EVERY MORNING   Cholecalciferol (VITAMIN D-3) 5000 UNITS TABS Take 5,000 Units by mouth every other day.    colchicine 0.6 MG tablet TAKE 1 TABLET (0.6 MG TOTAL) BY MOUTH DAILY AS NEEDED (FOR GOUT)   Continuous Blood Gluc Receiver (DEXCOM G7 RECEIVER) DEVI by Does not apply route.   Continuous Blood Gluc Sensor (DEXCOM G7 SENSOR) MISC 1 Device by Does not apply route as directed. Apply sensor every 10 days   dicyclomine (BENTYL) 20 MG tablet TAKE 1 TABLET UP TO FOUR TIMES A DAY FOR GI CRAMPING, PAIN, NAUSEA, AND VOMITING AS NEEDED   DULoxetine (CYMBALTA) 60 MG capsule TAKE ONE CAPSULE BY MOUTH EVERYDAY AT BEDTIME   estradiol (ESTRACE) 0.1 MG/GM vaginal cream Place 1 Applicatorful vaginally 3 (three) times a week.   FEROSUL 325 (65 Fe) MG tablet TAKE ONE TABLET BY MOUTH every other evening   fluticasone (FLONASE) 50 MCG/ACT nasal spray Place 2 sprays into both nostrils daily.   furosemide (LASIX) 20  MG tablet Take 3 tablets (60 mg total) by mouth 2 (two) times daily.   gabapentin (NEURONTIN) 300 MG capsule TAKE ONE CAPSULE BY MOUTH EVERYDAY AT BEDTIME May take additional capsule daily if needed   insulin aspart (NOVOLOG FLEXPEN) 100 UNIT/ML FlexPen Max daily 45 units   insulin degludec (TRESIBA FLEXTOUCH) 200 UNIT/ML FlexTouch Pen Inject 98 Units into the skin daily.   Insulin Pen Needle (GLOBAL EASE INJECT PEN NEEDLES) 32G X 4 MM MISC 1 Device by Other route in the morning, at noon, in the evening, and at bedtime.   ipratropium-albuterol (DUONEB) 0.5-2.5 (3) MG/3ML SOLN Take 3 mLs by nebulization every 6 (six) hours as needed  (wheezing).   ketoconazole (NIZORAL) 2 % cream APPLY TO AFFECTED AREA TWICE A DAY   meloxicam (MOBIC) 7.5 MG tablet Take 1 tablet (7.5 mg total) by mouth daily as needed for pain.   metolazone (ZAROXOLYN) 5 MG tablet TAKE 1 TABLET BY MOUTH EVERY DAY AS NEEDED   MICROLET LANCETS MISC 1 each by Other route. As directed   nystatin (MYCOSTATIN/NYSTOP) powder Apply 1 application topically 3 (three) times daily.   ondansetron (ZOFRAN) 4 MG tablet TAKE ONE TABLET BY MOUTH every EIGHT hours AS NEEDED FOR NAUSEA AND VOMITING   OXYGEN Inhale 2 L into the lungs continuous.   pantoprazole (PROTONIX) 40 MG tablet TAKE ONE TABLET BY MOUTH EVERY MORNING   polyethylene glycol (MIRALAX / GLYCOLAX) packet Take 17 g by mouth daily as needed for mild constipation.   potassium chloride (MICRO-K) 10 MEQ CR capsule Take 5 capsules (50 mEq total) by mouth daily.   rOPINIRole (REQUIP) 4 MG tablet TAKE 1 TABLET BY MOUTH EVERYDAY AT BEDTIME   Semaglutide, 1 MG/DOSE, 4 MG/3ML SOPN Inject 1 mg as directed 2 (two) times a week.   simethicone (GAS-X) 80 MG chewable tablet Chew 1 tablet (80 mg total) by mouth every 6 (six) hours as needed for flatulence.   spironolactone (ALDACTONE) 25 MG tablet Take 0.5 tablets (12.5 mg total) by mouth daily.   traMADol (ULTRAM) 50 MG tablet Take 1 tablet (50 mg total) by mouth 3 (three) times daily as needed.   UNABLE TO FIND CPAP- At bedtime   nitrofurantoin (MACRODANTIN) 50 MG capsule Take 50 mg by mouth daily. (Patient not taking: Reported on 07/14/2022)   No facility-administered encounter medications on file as of 07/14/2022.    Allergies (verified) Almond oil, Morphine and related, Atorvastatin, Ceclor [cefaclor], Sulfa antibiotics, Ciprofloxacin, Levaquin [levofloxacin], Losartan, Peanut-containing drug products, Primidone, Repatha [evolocumab], and Statins   History: Past Medical History:  Diagnosis Date   Anemia    Arthritis    Asthma    Chronic diastolic CHF 05/2014    Echocardiogram 05/2019: EF 70, no RWMA, mild LVH, Gr 1 DD, normal RVSF, severe LVH, borderline asc Aorta (39 mm)   CKD (chronic kidney disease)    Depression    Diabetes mellitus without complication (HCC)    Diverticulitis    Dyspnea    with exertion   GERD (gastroesophageal reflux disease)    History of blood transfusion    Hyperlipidemia    cannot tolerate statins   Hypertension    patient states she has never had high blood pressure.    Nuclear stress test    Myoview 05/2019: EF 83, no ischemia or infarction; Low Risk   Peripheral neuropathy    Pneumonia    PONV (postoperative nausea and vomiting)    RLS (restless legs syndrome)    Sleep apnea  uses CPAP   Ulcerative colitis (HCC)    dr Loreta [address]   Past Surgical History:  Procedure Laterality Date   ABDOMINAL HYSTERECTOMY     APPENDECTOMY     BIOPSY  10/10/2021   Procedure: BIOPSY;  Surgeon: Jeani Hawking, MD;  Location: [facility];  Service: Gastroenterology;;   BREAST BIOPSY Left    BREAST SURGERY     left biopsy   CARDIAC CATHETERIZATION     CESAREAN SECTION     CHOLECYSTECTOMY     COLONOSCOPY WITH PROPOFOL N/A 10/10/2021   Procedure: COLONOSCOPY WITH PROPOFOL;  Surgeon: Jeani Hawking, MD;  Location: [facility];  Service: Gastroenterology;  Laterality: N/A;   HERNIA REPAIR     INSERTION OF MESH N/A 11/15/2015   Procedure: INSERTION OF MESH;  Surgeon: Karie Soda, MD;  Location: [facility];  Service: General;  Laterality: N/A;   LAPAROSCOPIC LYSIS OF ADHESIONS N/A 11/15/2015   Procedure: LAPAROSCOPIC LYSIS OF ADHESIONS;  Surgeon: Karie Soda, MD;  Location: [facility];  Service: General;  Laterality: N/A;   RIGHT HEART CATHETERIZATION N/A 02/27/2013   Procedure: RIGHT HEART CATH;  Surgeon: Micheline Chapman, MD;  Location: [facility] CATH LAB;  Service: Cardiovascular;  Laterality: N/A;   RIGHT HEART CATHETERIZATION N/A 12/14/2013   Procedure: RIGHT HEART CATH;  Surgeon: Laurey Morale, MD;  Location: [facility] CATH LAB;  Service:  Cardiovascular;  Laterality: N/A;   SIGMOIDECTOMY  2010   diverticular disease   VENTRAL HERNIA REPAIR N/A 11/15/2015   Procedure: LAPAROSCOPIC VENTRAL WALL HERNIA REPAIR;  Surgeon: Karie Soda, MD;  Location: [facility];  Service: General;  Laterality: N/A;   Family History  Problem Relation Age of Onset   Heart disease Mother    Hypertension Mother    Dementia Mother    Heart disease Father    COPD Father    Hypertension Father    Heart disease Brother    Other Brother        knee replacement; hip replacement   Other Brother        cant brathe when laying down   Diabetes Maternal Grandmother    Breast cancer Neg Hx    Colon cancer Neg Hx    Social History   Socioeconomic History   Marital status: Divorced    Spouse name: Not on file   Number of children: Not on file   Years of education: Not on file   Highest education level: Not on file  Occupational History   Occupation: unemployed  Tobacco Use   Smoking status: Never    Passive exposure: Past   Smokeless tobacco: Never  Vaping Use   Vaping Use: Never used  Substance and Sexual Activity   Alcohol use: No   Drug use: No   Sexual activity: Not on file  Other Topics Concern   Not on file  Social History Narrative   Live with spouse   Right handed   Drinks caffiene prn   One floor home   retired   International aid/development worker of Corporate investment banker Strain: Low Risk  (07/14/2022)   Overall Financial Resource Strain (CARDIA)    Difficulty of Paying Living Expenses: Not hard at all  Food Insecurity: No Food Insecurity (07/14/2022)   Hunger Vital Sign    Worried About Running Out of Food in the Last Year: Never true    Ran Out of Food in the Last Year: Never true  Transportation Needs: No Transportation Needs (07/14/2022)   PRAPARE - Transportation  Lack of Transportation (Medical): No    Lack of Transportation (Non-Medical): No  Physical Activity: Insufficiently Active (07/14/2022)   Exercise Vital Sign    Days  of Exercise per Week: 7 days    Minutes of Exercise per Session: 10 min  Stress: No Stress Concern Present (07/14/2022)   [employer] of Occupational Health - Occupational Stress Questionnaire    Feeling of Stress : Not at all  Social Connections: Socially Isolated (07/14/2022)   Social Connection and Isolation Panel [NHANES]    Frequency of Communication with Friends and Family: Never    Frequency of Social Gatherings with Friends and Family: Never    Attends Religious Services: Never    Database administrator or Organizations: No    Attends Engineer, structural: Never    Marital Status: Divorced    Tobacco Counseling Counseling given: Not Answered   Clinical Intake:  Pre-visit preparation completed: Yes  Pain : No/denies pain     Nutritional Risks: None Diabetes: Yes CBG done?: Yes (349 per pt) CBG resulted in Enter/ Edit results?: No Did pt. bring in CBG monitor from home?: No  How often do you need to have someone help you when you read instructions, pamphlets, or other written materials from your doctor or pharmacy?: 1 - Never  Diabetic? Nutrition Risk Assessment:  Has the patient had any N/V/D within the last 2 months?  Yes , nausea this morining Does the patient have any non-healing wounds?  No , 349 today per pt. Has the patient had any unintentional weight loss or weight gain?  No   Diabetes:  Is the patient diabetic?  Yes  If diabetic, was a CBG obtained today?  Yes , Did the patient bring in their glucometer from home?  No  How often do you monitor your CBG's? On continuous monitor.   Financial Strains and Diabetes Management:  Are you having any financial strains with the device, your supplies or your medication? No .  Does the patient want to be seen by Chronic Care Management for management of their diabetes?  No , Pt declined Would the patient like to be referred to a Nutritionist or for Diabetic Management?  No   Diabetic  Exams:  Diabetic Eye Exam: Completed 12/22/19 [facility]  pt will call for appointment Diabetic Foot Exam: Completed 11/28/20 Dr.Evans pt will call for appointment.    Interpreter Needed?: No  Information entered by :: C.Verlon Pischke LPN   Activities of Daily Living    07/14/2022    2:00 PM  In your present state of health, do you have any difficulty performing the following activities:  Hearing? 0  Vision? 0  Difficulty concentrating or making decisions? 0  Walking or climbing stairs? 1  Comment on 02  Dressing or bathing? 0  Doing errands, shopping? 0  Preparing Food and eating ? N  Using the Toilet? N  In the past six months, have you accidently leaked urine? Y  Comment when coughs hard  Do you have problems with loss of bowel control? N  Managing your Medications? N  Managing your Finances? N  Housekeeping or managing your Housekeeping? N    Patient Care Team: Joaquim Nam, MD as PCP - General (Family Medicine) Tonny Bollman, MD as PCP - Cardiology (Cardiology) Storm Frisk, MD as Consulting Physician (Pulmonary Disease) Karie Soda, MD as Consulting Physician (General Surgery) de Millsap, Hilton Cork, MD (Inactive) as Consulting Physician (Pulmonary Disease) Kathyrn Sheriff, [facility]  as Pharmacist (Pharmacist) Kennon Rounds as Physician Assistant (Cardiology)  Indicate any recent Medical Services you may have received from other than [facility] providers in the past year (date may be approximate).     Assessment:   This is a routine wellness [place] for [place].  Hearing/Vision screen Hearing Screening - Comments:: No aids Vision Screening - Comments:: Glasses - [facility]  Dietary issues and exercise activities discussed: Current Exercise Habits: Home exercise routine, Type of exercise: walking, Time (Minutes): 15, Frequency (Times/Week): 7, Weekly Exercise (Minutes/Week): 105, Exercise limited by: respiratory conditions(s) (on o2)    Goals Addressed             This Visit's Progress    Patient Stated       Get knees fixed.       Depression Screen    07/14/2022    1:57 PM 06/26/2022    2:36 PM 05/22/2022   12:31 PM 07/10/2021   11:14 AM 08/12/2020    1:54 PM 12/28/2017    2:09 PM 09/13/2017    4:28 PM  PHQ 2/9 Scores  PHQ - 2 Score 1 6 4 1 4 2 6   PHQ- 9 Score 1 16 20 4 20 14 22     Fall Risk    07/14/2022    1:59 PM 06/26/2022    2:35 PM 05/26/2022    2:27 PM 05/22/2022   12:31 PM 07/10/2021   11:08 AM  Fall Risk   Falls in the past year? 0 1 1 1  0  Number falls in past yr: 0 0 0 0 0  Injury with Fall? 0 0 0 0 0  Risk for fall due to : Impaired balance/gait Impaired balance/gait;Impaired mobility;History of fall(s)  Impaired mobility Impaired balance/gait  Follow up Falls prevention discussed;Falls evaluation completed;Education provided Falls evaluation completed;Falls prevention discussed Falls evaluation completed Falls evaluation completed Falls evaluation completed;Education provided;Falls prevention discussed    FALL RISK PREVENTION PERTAINING TO THE HOME:  Any stairs in or around the home? Yes  If so, are there any without handrails? No  Home free of loose throw rugs in walkways, pet beds, electrical cords, etc? Yes  Adequate lighting in your home to reduce risk of falls? Yes   ASSISTIVE DEVICES UTILIZED TO PREVENT FALLS:  Life alert? No  Use of a cane, walker or w/c? Yes  Grab bars in the bathroom? No  Shower chair or bench in shower? Yes  Elevated toilet seat or a handicapped toilet? Yes    Cognitive Function:        07/14/2022    2:01 PM 07/10/2021   11:10 AM  6CIT Screen  What Year? 0 points 0 points  What month? 0 points 0 points  What time? 0 points 0 points  Count back from 20 0 points 0 points  Months in reverse 0 points 0 points  Repeat phrase 6 points 0 points  Total Score 6 points 0 points    Immunizations Immunization History  Administered Date(s) Administered   Fluad  Quad(high Dose 65+) 12/31/2021   Influenza Split 12/01/2012, 01/18/2017   Influenza Whole 12/25/2011   Influenza,inj,Quad PF,6+ Mos 11/27/2013, 12/08/2014, 12/11/2015   Influenza-Unspecified 12/07/2017   Janssen (J&J) SARS-COV-2 Vaccination 06/15/2019   Pneumococcal Conjugate-13 02/17/2017   Pneumococcal Polysaccharide-23 11/27/2013   Tdap 04/01/2010   Zoster Recombinat (Shingrix) 10/06/2021, 12/31/2021    TDAP status: Due, Education has been provided regarding the importance of this vaccine. Advised may receive this vaccine at local pharmacy or  Health Dept. Aware to provide a copy of the vaccination record if obtained from local pharmacy or Health Dept. Verbalized acceptance and understanding.  Flu Vaccine status: Up to date  Pneumococcal vaccine status: Due, Education has been provided regarding the importance of this vaccine. Advised may receive this vaccine at local pharmacy or Health Dept. Aware to provide a copy of the vaccination record if obtained from local pharmacy or Health Dept. Verbalized acceptance and understanding.  Covid-19 vaccine status: Declined, Education has been provided regarding the importance of this vaccine but patient still declined. Advised may receive this vaccine at local pharmacy or Health Dept.or vaccine clinic. Aware to provide a copy of the vaccination record if obtained from local pharmacy or Health Dept. Verbalized acceptance and understanding.  Qualifies for Shingles Vaccine? Yes   Zostavax completed No   Shingrix Completed?: Yes  Screening Tests Health Maintenance  Topic Date Due   Diabetic kidney evaluation - Urine ACR  Never done   DTaP/Tdap/Td (2 - Td or Tdap) 04/01/2020   OPHTHALMOLOGY EXAM  12/21/2020   FOOT EXAM  11/28/2021   Pneumonia Vaccine 14+ Years old (3 of 3 - PPSV23 or PCV20) 02/17/2022   INFLUENZA VACCINE  10/08/2022   HEMOGLOBIN A1C  12/26/2022   Diabetic kidney evaluation - eGFR measurement  06/26/2023   Medicare Annual  Wellness (AWV)  07/14/2023   MAMMOGRAM  08/13/2023   COLONOSCOPY (Pts 45-86yrs Insurance coverage will need to be confirmed)  10/11/2023   DEXA SCAN  Completed   Hepatitis C Screening  Completed   Zoster Vaccines- Shingrix  Completed   HPV VACCINES  Aged Out   COVID-19 Vaccine  Discontinued    Health Maintenance  Health Maintenance Due  Topic Date Due   Diabetic kidney evaluation - Urine ACR  Never done   DTaP/Tdap/Td (2 - Td or Tdap) 04/01/2020   OPHTHALMOLOGY EXAM  12/21/2020   FOOT EXAM  11/28/2021   Pneumonia Vaccine 6+ Years old (3 of 3 - PPSV23 or PCV20) 02/17/2022    Colorectal cancer screening: Type of screening: Colonoscopy. Completed 10/10/2021. Repeat every 2 years  Mammogram status: Completed 08/12/2021. Repeat every year order placed.  Bone Density status: Completed 08/13/23. Results reflect: Bone density results: NORMAL. Repeat every 5 years.  Lung Cancer Screening: (Low Dose CT Chest recommended if Age 10-80 years, 30 pack-year currently smoking OR have quit w/in 15years.) does not qualify.   Lung Cancer Screening Referral: no  Additional Screening:  Hepatitis C Screening: does qualify; Completed 09/04/19  Vision Screening: Recommended annual ophthalmology exams for early detection of glaucoma and other disorders of the eye. Is the patient up to date with their annual eye exam?  No  Who is the provider or what is the name of the office in which the patient attends annual eye exams? [facility], pt will call for appointment. If pt is not established with a provider, would they like to be referred to a provider to establish care? Yes .   Dental Screening: Recommended annual dental exams for proper oral hygiene  Community Resource Referral / Chronic Care Management: CRR required this visit?  No   CCM required this visit?  No      Plan:     I have personally reviewed and noted the following in the patient's chart:   Medical and social history Use  of alcohol, tobacco or illicit drugs  Current medications and supplements including opioid prescriptions. Patient is not currently taking opioid prescriptions. Functional ability and status  Nutritional status Physical activity Advanced directives List of other physicians Hospitalizations, surgeries, and ER visits in previous 12 months Vitals Screenings to include cognitive, depression, and falls Referrals and appointments  In addition, I have reviewed and discussed with patient certain preventive protocols, quality metrics, and best practice recommendations. A written personalized care plan for preventive services as well as general preventive health recommendations were provided to patient.     Maryan Puls, LPN   03/14/1094   Nurse Notes: Order placed for mammogram.

## 2022-07-16 ENCOUNTER — Ambulatory Visit: Payer: Medicare HMO

## 2022-07-17 ENCOUNTER — Telehealth: Payer: Self-pay

## 2022-07-17 NOTE — Progress Notes (Unsigned)
Care Management & Coordination Services Pharmacy Team  Reason for Encounter: Appointment Reminder  Contacted patient to confirm telephone appointment with Al Corpus , PharmD on 07/21/22 at 3:45. {US HC Outreach:[id number]}  Do you have any problems getting your medications? Yes  Symptoms of shakiness.  What is your top health concern you would like to discuss at your upcoming visit?   Have you seen any other providers since your last visit with PCP? {yes/no:[id number]}   Chart review:  Recent office visits:  07/09/22-Graham Duncan,MD(PCP)- f/u DM  Recent consult visits:  ***  Hospital visits:  {Hospital DC Yes/No:[id number]}   Star Rating Drugs:  Medication:  Last Fill: Day Supply Novolog  06/03/22 26 Tresiba  Ozempic   07/14/22  28  Care Gaps: Annual wellness visit in last year? Yes  If Diabetic: Last eye exam / retinopathy screening:2021 Last diabetic foot exam:2022   Al Corpus, PharmD notified  Burt Knack, [facility] Clinical Pharmacy Assistant [phone]

## 2022-07-18 ENCOUNTER — Other Ambulatory Visit: Payer: Self-pay | Admitting: Family Medicine

## 2022-07-20 DIAGNOSIS — J449 Chronic obstructive pulmonary disease, unspecified: Secondary | ICD-10-CM | POA: Diagnosis not present

## 2022-07-21 ENCOUNTER — Ambulatory Visit: Payer: Medicare HMO | Admitting: Physical Therapy

## 2022-07-21 ENCOUNTER — Ambulatory Visit: Payer: Medicare HMO | Admitting: Pharmacist

## 2022-07-21 DIAGNOSIS — J441 Chronic obstructive pulmonary disease with (acute) exacerbation: Secondary | ICD-10-CM

## 2022-07-21 DIAGNOSIS — J449 Chronic obstructive pulmonary disease, unspecified: Secondary | ICD-10-CM

## 2022-07-21 MED ORDER — BREZTRI AEROSPHERE 160-9-4.8 MCG/ACT IN AERO
2.0000 | INHALATION_SPRAY | Freq: Two times a day (BID) | RESPIRATORY_TRACT | 0 refills | Status: DC
Start: 2022-07-21 — End: 2022-08-27

## 2022-07-21 NOTE — Progress Notes (Signed)
Care Management & Coordination Services Pharmacy Note  07/21/2022 Name:  Kathleen Hatfield MRN:  [id number] DOB:  Nov 16, 1955  Summary: F/u visit -DM: A1c 11.1% (06/2022), worsened from 7.6%, she reports BG mostly in 200s now; she recently changed from Trulicity to Ozempic and is taking 1 mg 2x per week (Thurs, Sat); she reports continued improvement in tolerability each week and asks if she can take both doses on same day; she reports more appetite suppression with Ozempic and wt loss (though weight is hard to gauge with frequent fluctuations due to fluid status); she also reports she usually forgets to take Novolog before meals and won't remember until she has already started eating -HFpEF: pt reports weight is down and stable, 258# lbs today (previously up to 272#) -COPD: pt has stopped all maintenance nebulizers (Yupelri, Performist, budesnide) and is using albuterol HFA daily and nebulizer 1-2x weekly; discussed given hx of frequent exacerbations, a maintenance inhaler is recommended, she has not done well with powder inhalers but has not tried [place], she is open to trial of Breztri   Recommendations/Changes made from today's visit: -Try Ozempic 1 mg x 2 doses on same day- wait until Thursday 5/16; let us know how she does with this, can change to Ozempic 2 mg dose if she tolerates -Advised to take Novolog during or immediately following a meal if she forgets to take it prior to meal (though ideally 15 min prior to meal) -Start Breztri 2 puffs BID trial; advised to call pulmonology for f/u appt  Follow up plan: -Health Concierge will call patient Monday 5/20 for update on Ozempic -Pharmacist follow up televisit scheduled for 1 month -Mobile MMG 09/04/22; Endocrine appt 10/06/22; Neurology appt 11/26/22   Subjective: Kathleen Hatfield is an 67 y.o. year old female who is a primary patient of Joaquim Nam, MD.  The care coordination team was consulted for assistance with disease management and care  coordination needs.    Engaged with patient by telephone for follow up visit.  Patient Care Team: Joaquim Nam, MD as PCP - General (Family Medicine) Tonny Bollman, MD as PCP - Cardiology (Cardiology) Storm Frisk, MD as Consulting Physician (Pulmonary Disease) Karie Soda, MD as Consulting Physician (General Surgery) de Gate, Hilton Cork, MD (Inactive) as Consulting Physician (Pulmonary Disease) Kathyrn Sheriff, [facility] as Pharmacist (Pharmacist) Kennon Rounds as Physician Assistant (Cardiology)  Recent office visits: 07/09/22 Dr Para March OV: f/u - off all LA nebulizers, continues duoneb. Take Ozempic 5/7, 5/10 (trying to get to 2 mg/week)  06/26/22 Dr Para March OV: f/u - A1c 11.1%. Increase Ozempic to 2 mg. Increase Tresiba 2 units if AM BG > 150.   05/22/22 Dr Para March OV: f/u - feels worse on Repatha compared to Praluent. Asked about Ozempic. Cut spironolactone in 1/2.  Trial off Repatha. Restart Flonase.  03/12/22 Dr Para March OV: cough - rx Augmentin.  11/07/21 Dr Para March OV: annual - A1c 7.6%. Referred to endocrine and neurology.   Recent consult visits: 05/26/22 PA [facility] (Neurology): tremor - referred to PT. Not taking primidone as directed - take 50 mg 1/2 tab x 1 week then 1 tab thereafter. Decrease ropinirole to 2 mg BID then 2 mg HS. Agree with psychotherapy for depression.  05/01/22 Cardiology TE: updated Furosemide dose to 60 mg BID.  03/31/22 Dr Lonzo Cloud (Endocrine): A1c 11.8%, taking insulin QOD (nonadherent). Change Tresiba to 70 units daily. Start Novolog before meals. Sample of Dexcom G7 given.   03/03/22 Urgent  Care: COPD exacerbation. Rx prednisone. Covid positive, rx molnupravir.   02/25/22 PA Tereso Newcomer (Cardiology): f/u HF - glucose 509. D/C fenofibrate x 2 wks (myalgias). QT prolonged today, check labs. D/c zofran. Transitioning to Repatha 2024 d/t formulary change.  11/26/21 Dr Alben Spittle (Cardiology): f/u HF - increase furosemide to 60 mg BID. Increase  fenofibrate to 145 mg. Discussed SGLT2-I - she does have hx of recurrent UTI so deferred.   11/25/21 Dr Tat (Neurology): tremor - not noted on exam, no evidence essential tremor. COPD meds may be driving tremor. Try primidone 50 mg. For RLS - ropinirole needs to be decreased - trial decrease to 2 mg AM, 4 mg PM x 2 weeks, then 2 mg BID x 2 weeks, then 2 mg only HS. Trial gabapentin 300 mg BID at same time to compensate.    10/30/21 Urgent Care - Pyruria, myalgia. Rx'd Keflex. Urine cx was negative, advised to d/c abx.  Hospital visits: None in previous 6 months   Objective:  Lab Results  Component Value Date   CREATININE 1.57 (H) 06/26/2022   BUN 30 (H) 06/26/2022   GFR 48.76 (L) 02/27/2021   EGFR 35 (L) 02/25/2022   GFRNONAA 48 (L) 03/31/2021   GFRAA 69 01/19/2020   NA 135 06/26/2022   K 4.3 06/26/2022   CALCIUM 11.2 (H) 06/26/2022   CO2 28 06/26/2022   GLUCOSE 337 (H) 06/26/2022    Lab Results  Component Value Date/Time   HGBA1C 11.1 (H) 06/26/2022 03:20 PM   HGBA1C 11.8 (A) 03/31/2022 09:20 AM   HGBA1C 7.6 (H) 11/07/2021 03:47 PM   GFR 48.76 (L) 02/27/2021 12:16 PM   GFR 38.07 (L) 11/28/2020 03:56 PM    Last diabetic Eye exam:  Lab Results  Component Value Date/Time   HMDIABEYEEXA No Retinopathy 12/22/2019 12:00 AM    Last diabetic Foot exam: No results found for: "HMDIABFOOTEX"   Lab Results  Component Value Date   CHOL 202 (H) 11/25/2021   HDL 47 11/25/2021   LDLCALC 105 (H) 11/25/2021   LDLDIRECT 96 02/25/2022   TRIG 297 (H) 11/25/2021   CHOLHDL 4.3 11/25/2021       Latest Ref Rng & Units 02/25/2022   10:31 AM 11/25/2021   12:58 PM 11/07/2021    3:47 PM  Hepatic Function  Total Protein 6.0 - 8.5 g/dL 6.7  7.2  6.8   Albumin 3.9 - 4.9 g/dL 4.2  4.6    AST 0 - 40 IU/L 16  21  18    ALT 0 - 32 IU/L 17  18  16    Alk Phosphatase 44 - 121 IU/L 87  98    Total Bilirubin 0.0 - 1.2 mg/dL 0.3  0.5  0.3     Lab Results  Component Value Date/Time   TSH 3.14  11/07/2021 03:47 PM   TSH 2.850 06/09/2019 12:50 PM       Latest Ref Rng & Units 02/25/2022   10:31 AM 11/07/2021    3:47 PM 03/31/2021    6:06 PM  CBC  WBC 3.4 - 10.8 x10E3/uL 9.9  10.5  9.7   Hemoglobin 11.1 - 15.9 g/dL 16.1  09.6  04.5   Hematocrit 34.0 - 46.6 % 44.7  40.5  41.6   Platelets 150 - 450 x10E3/uL 275  325  328     Lab Results  Component Value Date/Time   VD25OH 45.52 06/18/2020 04:52 PM   VD25OH 47.60 06/22/2019 01:59 PM   VITAMINB12 384 09/16/2008 04:20 AM  Clinical ASCVD: No  The ASCVD Risk score (Arnett DK, et al., 2019) failed to calculate for the following reasons:   The systolic blood pressure is missing       07/14/2022    1:57 PM 06/26/2022    2:36 PM 05/22/2022   12:31 PM  Depression screen PHQ 2/9  Decreased Interest 0 3 2  Down, Depressed, Hopeless 1 3 2   PHQ - 2 Score 1 6 4   Altered sleeping 0 3 3  Tired, decreased energy 0 2 3  Change in appetite 0 1 3  Feeling bad or failure about yourself  0 1 2  Trouble concentrating 0 1 2  Moving slowly or fidgety/restless 0 2 3  Suicidal thoughts 0 0 0  PHQ-9 Score 1 16 20   Difficult doing work/chores Not difficult at all Extremely dIfficult Extremely dIfficult    Social History   Tobacco Use  Smoking Status Never   Passive exposure: Past  Smokeless Tobacco Never   BP Readings from Last 3 Encounters:  07/09/22 110/68  06/26/22 118/78  06/23/22 (!) 142/76   Pulse Readings from Last 3 Encounters:  07/09/22 93  06/26/22 96  05/26/22 74   Wt Readings from Last 3 Encounters:  07/14/22 258 lb (117 kg)  07/09/22 272 lb (123.4 kg)  06/26/22 272 lb (123.4 kg)   BMI Readings from Last 3 Encounters:  07/14/22 42.93 kg/m  07/09/22 45.26 kg/m  06/26/22 45.26 kg/m    Allergies  Allergen Reactions   Almond Oil Anaphylaxis, Shortness Of Breath and Swelling   Morphine And Related Shortness Of Breath and Other (See Comments)    Pt. States while in the hospital it affected her breathing, O2  dropped to the 70's   Atorvastatin Other (See Comments)    Leg weakness   Ceclor [Cefaclor] Nausea And Vomiting   Sulfa Antibiotics Nausea And Vomiting   Ciprofloxacin Other (See Comments)    Makes joints and muscles ache   Levaquin [Levofloxacin] Other (See Comments)    Body aches   Losartan Other (See Comments)    Weakness    Peanut-Containing Drug Products     Pt states she thinks it is causing gout flare-ups   Primidone     Intolerant, SOB with use.    Repatha [Evolocumab]     Aches   Statins Other (See Comments)    Leg and body weakness    Medications Reviewed Today     Reviewed by Kathyrn Sheriff, [facility] (Pharmacist) on 07/21/22 at 1609  Med List Status: <None>   Medication Order Taking? Sig Documenting Provider Last Dose Status Informant  acetaminophen (TYLENOL) 500 MG tablet [id number] Yes Take 500 mg by mouth every 6 (six) hours as needed for moderate pain. [provider] Taking Active Self  albuterol (VENTOLIN HFA) 108 (90 Base) MCG/ACT inhaler [id number] Yes INHALE TWO PUFFS BY MOUTH INTO LUNGS every SIX hours AS NEEDED Joaquim Nam, MD Taking Active   Alirocumab (PRALUENT) 150 MG/ML Ivory Broad [id number] Yes Inject 1 mL (150 mg total) into the skin every 14 (fourteen) days. Joaquim Nam, MD Taking Active   aspirin EC 81 MG tablet [id number] Yes Take 1 tablet (81 mg total) by mouth daily. Swallow whole. Tereso Newcomer T, PA-C Taking Active Self  BAYER CONTOUR NEXT TEST test strip [id number] Yes 1 each by Other route daily. As directed [provider] Taking Active Self           Med Note Katrinka Blazing, JEFFREY W  Mon May 03, 2017 11:02 PM)    Budeson-Glycopyrrol-Formoterol (BREZTRI AEROSPHERE) 160-9-4.8 MCG/ACT Sandrea Matte [id number]  Inhale 2 puffs into the lungs in the morning and at bedtime. Joaquim Nam, MD  Active   buPROPion (WELLBUTRIN XL) 300 MG 24 hr tablet [id number] Yes TAKE ONE TABLET BY MOUTH EVERY MORNING Joaquim Nam, MD Taking Active    Cholecalciferol (VITAMIN D-3) 5000 UNITS TABS [id number] Yes Take 5,000 Units by mouth every other day.  [provider] Taking Active Self  colchicine 0.6 MG tablet [id number] Yes TAKE 1 TABLET (0.6 MG TOTAL) BY MOUTH DAILY AS NEEDED (FOR GOUT) Joaquim Nam, MD Taking Active   Continuous Blood Gluc Receiver [facility] G7 RECEIVER) DEVI [id number] Yes by Does not apply route. [provider] Taking Active Self  Continuous Blood Gluc Sensor (DEXCOM G7 SENSOR) MISC [id number] Yes 1 Device by Does not apply route as directed. Apply sensor every 10 days Shamleffer, Konrad Dolores, MD Taking Active   dicyclomine (BENTYL) 20 MG tablet [id number] Yes TAKE 1 TABLET UP TO FOUR TIMES A DAY FOR GI CRAMPING, PAIN, NAUSEA, AND VOMITING AS NEEDED Joaquim Nam, MD Taking Active Self  DULoxetine (CYMBALTA) 60 MG capsule [id number] Yes TAKE ONE CAPSULE BY MOUTH EVERYDAY AT BEDTIME Joaquim Nam, MD Taking Active   estradiol (ESTRACE) 0.1 MG/GM vaginal cream [id number] Yes Place 1 Applicatorful vaginally 3 (three) times a week. [provider] Taking Active Self  FEROSUL 325 (65 Fe) MG tablet [id number] Yes TAKE ONE TABLET BY MOUTH every other evening Joaquim Nam, MD Taking Active   fluticasone [facility]) 50 MCG/ACT nasal spray [id number] Yes Place 2 sprays into both nostrils daily. Marita Kansas, PA-C Taking Active Self  furosemide (LASIX) 20 MG tablet [id number] Yes Take 3 tablets (60 mg total) by mouth 2 (two) times daily. Tereso Newcomer T, PA-C Taking Active   gabapentin (NEURONTIN) 300 MG capsule [id number] Yes TAKE ONE CAPSULE BY MOUTH EVERYDAY AT BEDTIME May take additional capsule daily if needed Joaquim Nam, MD Taking Active   insulin aspart (NOVOLOG FLEXPEN) 100 UNIT/ML FlexPen [id number] Yes Max daily 45 units Shamleffer, Konrad Dolores, MD Taking Active   insulin degludec (TRESIBA FLEXTOUCH) 200 UNIT/ML FlexTouch Pen [id number] Yes Inject 98 Units into the skin daily. Joaquim Nam, MD Taking Active            Med Note [facility], [facility] M   Tue Jul 14, 2022  1:50 PM) Pt takes 102 units QD now also on sliding scale.  Insulin Pen Needle (GLOBAL EASE INJECT PEN NEEDLES) 32G X 4 MM MISC [id number] Yes 1 Device by Other route in the morning, at noon, in the evening, and at bedtime. Shamleffer, Konrad Dolores, MD Taking Active   ipratropium-albuterol (DUONEB) 0.5-2.5 (3) MG/3ML SOLN [id number] Yes Take 3 mLs by nebulization every 6 (six) hours as needed (wheezing). Coralyn Helling, MD Taking Active Self  ketoconazole (NIZORAL) 2 % cream [id number] Yes APPLY TO AFFECTED AREA TWICE A DAY Joaquim Nam, MD Taking Active   meloxicam [facility]) 7.5 MG tablet [id number] Yes Take 1 tablet (7.5 mg total) by mouth daily as needed for pain. Joaquim Nam, MD Taking Active   metolazone (ZAROXOLYN) 5 MG tablet [id number] Yes TAKE 1 TABLET BY MOUTH EVERY DAY AS NEEDED Joaquim Nam, MD Taking Active   [facility] LANCETS MISC [id number] Yes 1 each by Other route. As directed [provider] Taking Active Self  Med Note Sharlynn Oliphant Oct 21, 2015  3:31 PM)    nitrofurantoin (MACRODANTIN) 50 MG capsule [id number] Yes Take 50 mg by mouth daily. [provider] Taking Active Self           Med Note Para March, Dwana Curd   Fri Nov 07, 2021  2:27 PM)    nystatin (MYCOSTATIN/NYSTOP) powder [id number] Yes Apply 1 application topically 3 (three) times daily. Joaquim Nam, MD Taking Active Self  ondansetron (ZOFRAN) 4 MG tablet [id number] Yes TAKE ONE TABLET BY MOUTH every EIGHT hours AS NEEDED FOR NAUSEA AND VOMITING Joaquim Nam, MD Taking Active   OXYGEN [id number] Yes Inhale 2 L into the lungs continuous. [provider] Taking Active Self  pantoprazole (PROTONIX) 40 MG tablet [id number] Yes TAKE ONE TABLET BY MOUTH EVERY MORNING Joaquim Nam, MD Taking Active   polyethylene glycol [facility] / GLYCOLAX) packet [id number] Yes Take 17 g by mouth daily as  needed for mild constipation. [provider] Taking Active Self  potassium chloride (MICRO-K) 10 MEQ CR capsule [id number] Yes Take 5 capsules (50 mEq total) by mouth daily. Joaquim Nam, MD Taking Active   rOPINIRole (REQUIP) 4 MG tablet [id number] Yes TAKE 1 TABLET BY MOUTH EVERYDAY AT BEDTIME Joaquim Nam, MD Taking Active   Semaglutide, 1 MG/DOSE, 4 MG/3ML SOPN [id number] Yes Inject 1 mg as directed 2 (two) times a week. Joaquim Nam, MD Taking Active   simethicone (GAS-X) 80 MG chewable tablet [id number] Yes Chew 1 tablet (80 mg total) by mouth every 6 (six) hours as needed for flatulence. Joaquim Nam, MD Taking Active   spironolactone (ALDACTONE) 25 MG tablet [id number] Yes Take 0.5 tablets (12.5 mg total) by mouth daily. Joaquim Nam, MD Taking Active   traMADol Janean Sark) 50 MG tablet [id number] Yes Take 1 tablet (50 mg total) by mouth 3 (three) times daily as needed. Joaquim Nam, MD Taking Active   UNABLE TO FIND [id number] Yes CPAP- At bedtime [provider] Taking Active Self            Patient Active Problem List   Diagnosis Date Noted   Diabetes mellitus (HCC) 03/31/2022   Myalgia 02/25/2022   Prolonged Q-T interval on ECG 02/25/2022   Aortic atherosclerosis (HCC) 06/04/2021   Preoperative cardiovascular examination 06/04/2021   Hyperglycemia 02/14/2021   Rash 09/27/2020   COPD exacerbation (HCC) 09/21/2020   Acute exacerbation of COPD with asthma (HCC) 09/19/2020   Mixed diabetic hyperlipidemia associated with type 2 diabetes mellitus (HCC) 09/19/2020   Pain of foot 06/20/2020   Medicare annual wellness visit, initial 06/20/2020   Constipation 06/11/2020   Diverticulosis of colon 06/11/2020   Irritable bowel syndrome 06/11/2020   Periumbilical pain 06/11/2020   RLS (restless legs syndrome)    Asthma exacerbation 01/08/2020   Muscle weakness 01/08/2020   Grade I diastolic dysfunction 01/08/2020   Drug-induced myopathy 10/10/2019    Advance care planning 06/14/2019   RLQ abdominal pain 06/14/2019   COPD (chronic obstructive pulmonary disease) (HCC) 06/14/2019   Leukocytosis 06/14/2019   Hypercalcemia 06/14/2019   Gout 06/14/2019   COPD with acute exacerbation (HCC) 05/03/2017   CKD (chronic kidney disease) stage 3, GFR 30-59 ml/min (HCC)    Hypersomnia with sleep apnea 09/16/2015   Fatigue 06/07/2015   Morbid obesity (HCC) 06/07/2015   Cough 04/18/2015   Chronic diastolic CHF 05/2014   (HFpEF) heart failure with preserved ejection fraction (HCC)    Pneumonia  of both lower lobes due to infectious organism 02/09/2013   OSA on CPAP 08/23/2012   Essential hypertension    Depression    GERD without esophagitis    Hyperlipidemia    Ulcerative colitis (HCC)    Patellar tendinitis 05/25/2011   Knee pain 05/25/2011   Myofascial pain 05/25/2011   Cervicalgia 05/25/2011    Immunization History  Administered Date(s) Administered   Fluad Quad(high Dose 65+) 12/31/2021   Influenza Split 12/01/2012, 01/18/2017   Influenza Whole 12/25/2011   Influenza,inj,Quad PF,6+ Mos 11/27/2013, 12/08/2014, 12/11/2015   Influenza-Unspecified 12/07/2017   Janssen (J&J) SARS-COV-2 Vaccination 06/15/2019   Pneumococcal Conjugate-13 02/17/2017   Pneumococcal Polysaccharide-23 11/27/2013   Tdap 04/01/2010   Zoster Recombinat (Shingrix) 10/06/2021, 12/31/2021    SDOH:  (Social Determinants of Health) assessments and interventions performed: No SDOH Interventions    Flowsheet Row Clinical Support from 07/14/2022 in [facility] [place] [facility] [place] Office Visit from 06/26/2022 in [facility] [place] [facility] [place] Office Visit from 05/22/2022 in [facility] [place] [facility] Clinical Support from 07/10/2021 in [facility] [place] [facility] Chronic Care Management from 09/02/2020 in [facility] [place] [facility] Chronic Care Management from 08/12/2020 in [facility]  [place] [facility] [place]  SDOH Interventions        Food Insecurity Interventions Intervention Not Indicated -- -- Intervention Not Indicated -- Intervention Not Indicated  Housing Interventions Intervention Not Indicated -- -- Intervention Not Indicated -- --  Transportation Interventions Intervention Not Indicated -- -- Intervention Not Indicated -- Intervention Not Indicated  Utilities Interventions Intervention Not Indicated -- -- -- -- --  Alcohol Usage Interventions Intervention Not Indicated (Score <7) -- -- -- -- --  Depression Interventions/Treatment  -- Counseling Counseling -- -- Medication  Financial Strain Interventions Intervention Not Indicated -- -- Intervention Not Indicated Intervention Not Indicated --  Physical Activity Interventions Patient Refused, Other (Comments) -- -- Intervention Not Indicated -- Other (Comments)  [Discussed  chair exercises.  Exercise booklet mailed to patient.]  Stress Interventions Intervention Not Indicated -- -- Intervention Not Indicated -- --  Social Connections Interventions Intervention Not Indicated, Patient Refused -- -- Intervention Not Indicated -- --      SDOH Screenings   Food Insecurity: No Food Insecurity (07/14/2022)  Housing: Low Risk  (07/14/2022)  Transportation Needs: No Transportation Needs (07/14/2022)  Utilities: Not At Risk (07/14/2022)  Alcohol Screen: Low Risk  (07/14/2022)  Depression (PHQ2-9): Low Risk  (07/14/2022)  Recent Concern: Depression (PHQ2-9) - High Risk (06/26/2022)  Financial Resource Strain: Low Risk  (07/14/2022)  Physical Activity: Insufficiently Active (07/14/2022)  Social Connections: Socially Isolated (07/14/2022)  Stress: No Stress Concern Present (07/14/2022)  Tobacco Use: Low Risk  (07/14/2022)    Medication Assistance: None required.  Patient affirms current coverage meets needs. - LIS  Medication Access: Within the past 30 days, how often has patient missed a dose of medication? 0 Is a pillbox or  other method used to improve adherence? Yes  Factors that may affect medication adherence? no barriers identified Are meds synced by current pharmacy? Yes  Are meds delivered by current pharmacy? Yes  Does patient experience delays in picking up medications due to transportation concerns? No   [facility] Reviewed: Is patient disadvantaged to use [facility]?: No  Current Rx insurance plan: [employer] Name and location of Current pharmacy:  [facility] [phone] - [place], [place] - [address] [place] [address] Phone:  [phone] Fax: [phone]  [facility] - [place], [place] - [address] [place] [address] Phone: [phone] Fax: [phone]  [facility] - [place], [place] - [address] [place] [address] Phone: [phone] Fax: [phone]  [facility] services reviewed with patient today?: Yes  30 day pill packs: Delivered 06/08/22 Bupropion HCL XL 300mg - take 1 tablet at evening meal Cholecalciferol (vit D3)-5000units- take 1 tablet breakfast every other day  Ropinirole 4mg - take 1 tablet breakfast 1 tablet bedtime  Pantoprazole 40mg - take 1 tablet bedtime Spironolactone 25mg - take 1 tablet breakfast Furosemide 20mg  take 3 tablets breakfast and dinner Duloxetine 60mg - take 1 tablet breakfast Ferrous Sulfate 325mg - take 1 tablet evening meal every other day  Gabapentin 300mg -take 1 tablet bedtime Aspirin 81mg  -take 1 tablet at breakfast  VIAL medications: Albuterol  108 mcg/act inhaler- 2 puffs into lungs every 6 hours as needed Gabapentin 300mg -take extra tablet as needed at bedtime   Patient declined the following medications this month: Cetirizine, gabapentin (extras)  Compliance/Adherence/Medication fill history: Care Gaps: Foot exam (11/28/21) Eye exam (12/21/21) UACR (never done)  Star-Rating Drugs: Trulicity - PDC  94%    ASSESSMENT / PLAN:  Hyperlipidemia: (LDL goal < 70) -Improved - LDL 96 (02/2022), improved from 208 (06/2021) with Praluent; TRIG 297 improved from > 500 -High ASCVD risk (25%) - reduced to 16% with improvement in cholesterol -Current treatment: Praluent 150 mg q14 days - Appropriate, Effective, Safe, Accessible -Medications previously tried: Multiple statins (atorvastatin, others) - leg weakness; Vascepa (cost), fenofibrate, Repatha (sinus) -Educated on Cholesterol goals; -Recommend to continue current medication  Diabetes (A1c goal <7%) -Uncontrolled- A1c 11.1% (06/2022) worsened from 7.6% previously; she is up to 120 units of Tresiba, she usually forgets Novolog before she eats and thought she could not take it during meal; she also switched from Trulicity to [employer] and reports more appetite suppression and some wt loss; she says she eats about 1/2 of what she used to; she asks if she can take Ozempic 1 mg x 2 on same day -Followed with Dr Lonzo Cloud (established care 03/2022) -Dx 2017, insulin since 2020. Hx of chronic UTIs, would avoid SGLT2-inhibitor -Current home glucose readings: via Dexcom G7; pt reports alarms have not been going off much lately  -glucose 200s-300s most of the time, she can get it down to 172 -Current medications: Ozempic 1 mg 2x weekly (Sat, Thurs) - Appropriate, Query Effective Tresiba U200 - 120 units daily (0.79 u/kg) -Appropriate, Query Effective Novolog sliding scale AC - typically 2-3 units with meals Dexcom G7 w/ reader - Appropriate, Effective, Safe, Accessible -Medications previously tried: Jardiance, Victoza, metformin, Trulicity (ineffective) -Current meal patterns: The patient reports she is on a salt free, sugar free, gluten free diet but had a hard time sticking to this. She tries to limit portions.  -Advised to take Novolog during or immediately following a meal if she forgets to take it prior to meal (though ideally 15 min prior to  meal) -Discussed she can try Ozempic 1 mg x 2 doses one same day; wait until Thursday 5/16; let us know how she does with this, we can send Ozempic 2 mg dose refill if she tolerates  Heart Failure (Goal: manage symptoms and prevent exacerbations) -Query controlled - pt reports swelling, some slight shortness of breath; weight is up to 269#; she has not taken metolazone this week, typically takes it 1-2 times per week -Last BP: "good"  after cutting spiro in half -Daily weight: pt reports dry wt 255#, she reports wt today 258# -Last ejection fraction: 70% (Date: 05/2019) -HF type: HFpEF (EF > 50%) -NYHA Class: III (marked limitation of activity) -Followed by Dr. Excell Seltzer -Current treatment: Spironolactone 25 mg - 1/2 tab daily AM - Appropriate, Effective, Safe, Accessible Furosemide 20 mg - 3 tab BID- Appropriate, Effective, Safe, Accessible Potassium 10 mEq - 5 tab daily- Appropriate, Effective, Safe, Accessible Metolazone 2.5 mg PRN wt gain - Appropriate, Effective, Query Safe,  -Medications previously tried: torsemide, bumetanide, losartan  -Recommended to continue current medication  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) -Not ideally controlled - pt has stopped maintenance nebulizers due "feeling like something is coating my lungs"; she is using albuterol HFA daily; she uses nebulizer "when it gets bad" usually about 1-2x per week -she wears Oxygen at night and PRN during the day;  -Followed by Dr Craige Cotta, [facility] Pulmonology. Pt does not follow up with pulmonology as frequently as recommended; last appt 08/2021 advised to f/u in 4 months -Pulmonary function testing: 2021 FEV1 42% predicted -Gold Grade: Gold 3 (FEV1 30-49%) -Current COPD Classification:  E (high sx, >2 exacerbations/yr) -Exacerbations requiring treatment in last 6 months: yes, multiple -Compliant with CPAP nightly -Current regimen: Duoneb PRN- Appropriate, Effective, Safe,  Accessible Albuterol HFA PRN- Appropriate, Effective, Safe, Accessible Oxygen -Medications previously tried: Yupelri, Budesonide, Performist (cost), Advair, Incruse, Trelegy (pt does not like powder inhalers) -Discussed benefits of maintenance inhalers, she has not done well with powder inhalers but does ok with HFA; she may tolerate Breztri, she is open to a trial -Start trial [employer] 2 puffs BID -Recommended to continue current medication; advised to follow up with pulmonology  Depression/Anxiety/Pain(Goal: Control symptoms) -Controlled, stable per patient -Current treatment: Bupropion 300 mg daily - Appropriate, Effective, Safe, Accessible Duloxetine 60 mg daily - Appropriate, Effective, Safe, Accessible Gabapentin 300 mg HS - Appropriate, Effective, Safe, Accessible -Medications previously tried/failed: none -Concern that ropinirole may be causing her to fall asleep suddenly during the day. This has been discussed with patient before and per chart records, she was unable to tolerate dose reductions. Most recently attempted taper 11/2021 per neurology instructions -Recommend to continue current medication  RLS / tremor (Goal: manage symptoms) -Follows with neurology; referred to PT for tremor -Pt reports she is not taking AM dose of ropinirole but takes gabapentin 1 pill in AM; she reports RLS symptoms recur before she can take her bedtime dose; discussed she can take "bedtime" doses up to 30 min prior to bedtime -Current treatment  Ropinirole 4 mg HS - Appropriate, Effective, Safe, Accessible Gabapentin 100 mg - 3 caps HS - Appropriate, Effective, Safe, Accessible -Medications previously tried: primidone -Recommended to continue current medication  Health Maintenance -Vaccine gaps: TDAP, Prevnar 20 -DEXA 08/2021 WNL    Al Corpus, PharmD, BCACP Clinical Pharmacist [facility] [phone]

## 2022-07-23 ENCOUNTER — Ambulatory Visit: Payer: Medicare HMO

## 2022-07-27 ENCOUNTER — Telehealth: Payer: Self-pay

## 2022-07-27 NOTE — Progress Notes (Signed)
Care Management & Coordination Services Pharmacy Team  Reason for Encounter: Medication coordination and delivery  Contacted patient to discuss medications and coordinate delivery from [facility]. {US HC Outreach:28874} Cycle dispensing form sent to *** for review.   Last adherence delivery date:07/07/22      Patient is due for next adherence delivery on: 08/05/22  This delivery to include: Adherence Packaging  30 Days  Bupropion HCL XL 300mg - take 1 tablet at evening meal Cholecalciferol (vit D3)-5000units- take 1 tablet breakfast every other day  Ropinirole 4mg - take 1 tablet breakfast 1 tablet bedtime  Pantoprazole 40mg - take 1 tablet bedtime Spironolactone 25mg - take 1 tablet breakfast Furosemide 20mg  take 3 tablets breakfast and dinner Duloxetine 60mg - take 1 tablet breakfast Ferrous Sulfate 325mg - take 1 tablet evening meal every other day  Gabapentin 300mg -take 1 tablet bedtime Aspirin 81mg  -take 1 tablet at breakfast  Patient declined the following medications this month: Gabapentin 300mg - supply on hand  Novolog Flex pen- has surplus on hand  Pen needles 32g- has surplus on hand   Refills requested from providers include: Gabapentin (PCP)  Confirmed delivery date of 08/05/22, advised patient that pharmacy will contact them the morning of delivery.   Any concerns about your medications? {yes/no:20286}  How often do you forget or accidentally miss a dose? {Missed doses:25554}  Do you use a pillbox? {yes/no:20286}  Is patient in packaging {yes/no:20286}  If yes  What is the date on your next pill pack?  Any concerns or issues with your packaging?   Recent blood pressure readings are as follows:***  Recent blood glucose readings are as follows:***   Chart review: Recent office visits:  ***  Recent consult visits:  ***  Hospital visits:  {Hospital DC Yes/No:21091515}  Medications: Outpatient Encounter Medications as of 07/27/2022  Medication Sig  Note   acetaminophen (TYLENOL) 500 MG tablet Take 500 mg by mouth every 6 (six) hours as needed for moderate pain.    albuterol (VENTOLIN HFA) 108 (90 Base) MCG/ACT inhaler INHALE TWO PUFFS BY MOUTH INTO LUNGS every SIX hours AS NEEDED    Alirocumab (PRALUENT) 150 MG/ML SOAJ Inject 1 mL (150 mg total) into the skin every 14 (fourteen) days.    aspirin EC 81 MG tablet Take 1 tablet (81 mg total) by mouth daily. Swallow whole.    BAYER CONTOUR NEXT TEST test strip 1 each by Other route daily. As directed    Budeson-Glycopyrrol-Formoterol (BREZTRI AEROSPHERE) 160-9-4.8 MCG/ACT AERO Inhale 2 puffs into the lungs in the morning and at bedtime.    buPROPion (WELLBUTRIN XL) 300 MG 24 hr tablet TAKE ONE TABLET BY MOUTH EVERY MORNING    Cholecalciferol (VITAMIN D-3) 5000 UNITS TABS Take 5,000 Units by mouth every other day.     colchicine 0.6 MG tablet TAKE 1 TABLET (0.6 MG TOTAL) BY MOUTH DAILY AS NEEDED (FOR GOUT)    Continuous Blood Gluc Receiver (DEXCOM G7 RECEIVER) DEVI by Does not apply route.    Continuous Blood Gluc Sensor (DEXCOM G7 SENSOR) MISC 1 Device by Does not apply route as directed. Apply sensor every 10 days    dicyclomine (BENTYL) 20 MG tablet TAKE 1 TABLET UP TO FOUR TIMES A DAY FOR GI CRAMPING, PAIN, NAUSEA, AND VOMITING AS NEEDED    DULoxetine (CYMBALTA) 60 MG capsule TAKE ONE CAPSULE BY MOUTH EVERYDAY AT BEDTIME    estradiol (ESTRACE) 0.1 MG/GM vaginal cream Place 1 Applicatorful vaginally 3 (three) times a week.    FEROSUL 325 (65 Fe) MG tablet  TAKE ONE TABLET BY MOUTH every other evening    fluticasone (FLONASE) 50 MCG/ACT nasal spray Place 2 sprays into both nostrils daily.    furosemide (LASIX) 20 MG tablet Take 3 tablets (60 mg total) by mouth 2 (two) times daily.    gabapentin (NEURONTIN) 300 MG capsule TAKE ONE CAPSULE BY MOUTH EVERYDAY AT BEDTIME May take additional capsule daily if needed    insulin aspart (NOVOLOG FLEXPEN) 100 UNIT/ML FlexPen Max daily 45 units    insulin  degludec (TRESIBA FLEXTOUCH) 200 UNIT/ML FlexTouch Pen Inject 98 Units into the skin daily. 07/14/2022: Pt takes 102 units QD now also on sliding scale.   Insulin Pen Needle (GLOBAL EASE INJECT PEN NEEDLES) 32G X 4 MM MISC 1 Device by Other route in the morning, at noon, in the evening, and at bedtime.    ipratropium-albuterol (DUONEB) 0.5-2.5 (3) MG/3ML SOLN Take 3 mLs by nebulization every 6 (six) hours as needed (wheezing).    ketoconazole (NIZORAL) 2 % cream APPLY TO AFFECTED AREA TWICE A DAY    meloxicam (MOBIC) 7.5 MG tablet Take 1 tablet (7.5 mg total) by mouth daily as needed for pain.    metolazone (ZAROXOLYN) 5 MG tablet TAKE 1 TABLET BY MOUTH EVERY DAY AS NEEDED    MICROLET LANCETS MISC 1 each by Other route. As directed    nitrofurantoin (MACRODANTIN) 50 MG capsule Take 50 mg by mouth daily.    nystatin (MYCOSTATIN/NYSTOP) powder Apply 1 application topically 3 (three) times daily.    ondansetron (ZOFRAN) 4 MG tablet TAKE ONE TABLET BY MOUTH every EIGHT hours AS NEEDED FOR NAUSEA AND VOMITING    OXYGEN Inhale 2 L into the lungs continuous.    pantoprazole (PROTONIX) 40 MG tablet TAKE ONE TABLET BY MOUTH EVERY MORNING    polyethylene glycol (MIRALAX / GLYCOLAX) packet Take 17 g by mouth daily as needed for mild constipation.    potassium chloride (MICRO-K) 10 MEQ CR capsule Take 5 capsules (50 mEq total) by mouth daily.    rOPINIRole (REQUIP) 4 MG tablet TAKE 1 TABLET BY MOUTH EVERYDAY AT BEDTIME    Semaglutide, 1 MG/DOSE, 4 MG/3ML SOPN Inject 1 mg as directed 2 (two) times a week.    simethicone (GAS-X) 80 MG chewable tablet Chew 1 tablet (80 mg total) by mouth every 6 (six) hours as needed for flatulence.    spironolactone (ALDACTONE) 25 MG tablet Take 0.5 tablets (12.5 mg total) by mouth daily.    traMADol (ULTRAM) 50 MG tablet Take 1 tablet (50 mg total) by mouth 3 (three) times daily as needed.    UNABLE TO FIND CPAP- At bedtime    No facility-administered encounter medications on  file as of 07/27/2022.   BP Readings from Last 3 Encounters:  07/09/22 110/68  06/26/22 118/78  06/23/22 (!) 142/76    Pulse Readings from Last 3 Encounters:  07/09/22 93  06/26/22 96  05/26/22 74    Lab Results  Component Value Date/Time   HGBA1C 11.1 (H) 06/26/2022 03:20 PM   HGBA1C 11.8 (A) 03/31/2022 09:20 AM   HGBA1C 7.6 (H) 11/07/2021 03:47 PM   Lab Results  Component Value Date   CREATININE 1.57 (H) 06/26/2022   BUN 30 (H) 06/26/2022   GFR 48.76 (L) 02/27/2021   GFRNONAA 48 (L) 03/31/2021   GFRAA 69 01/19/2020   NA 135 06/26/2022   K 4.3 06/26/2022   CALCIUM 11.2 (H) 06/26/2022   CO2 28 06/26/2022     Al Corpus, PharmD notified  Burt Knack, [facility] Clinical Pharmacy Assistant [phone]

## 2022-07-28 ENCOUNTER — Other Ambulatory Visit: Payer: Self-pay | Admitting: Family Medicine

## 2022-07-28 NOTE — Telephone Encounter (Signed)
Prescription Request  07/28/2022  LOV: 07/09/2022  What is the name of the medication or equipment? Semaglutide, 1 MG/DOSE, 4 MG/3ML SOPN   Have you contacted your pharmacy to request a refill? No   Which pharmacy would you like this sent to?  [id number] Judithann Sheen, [place] - [address] [place] [address] Phone: [phone] Fax: [phone]  Patient notified that their request is being sent to the clinical staff for review and that they should receive a response within 2 business days.   Please advise at [facility] [phone]

## 2022-07-28 NOTE — Telephone Encounter (Signed)
Last office visit 07/09/22 See office notes

## 2022-07-29 MED ORDER — SEMAGLUTIDE (2 MG/DOSE) 8 MG/3ML ~~LOC~~ SOPN
2.0000 mg | PEN_INJECTOR | SUBCUTANEOUS | 5 refills | Status: DC
Start: 2022-07-29 — End: 2023-03-08

## 2022-07-29 NOTE — Telephone Encounter (Signed)
Sent. Thanks.   

## 2022-07-29 NOTE — Telephone Encounter (Signed)
Spoke with patient and she is doing okay with the dose; states her body is getting more and more tolerant of it. She states that she would like to get the 2 mg dose pens now as she has started doing both 1 mg pens at the same time so she only has to do one injection.

## 2022-07-29 NOTE — Telephone Encounter (Signed)
Did she tolerate 2mg  dose (2 of the 1mg  doses at the same time)?  If so, we need to change to 2mg  dosing on the rx.  Please let me know.  Thanks.

## 2022-07-30 ENCOUNTER — Telehealth: Payer: Self-pay

## 2022-07-30 NOTE — Patient Outreach (Signed)
  Care Coordination   07/30/2022 Name: Kathleen Hatfield MRN: [id number] DOB: 04/02/1955   Care Coordination Outreach Attempts:  An unsuccessful telephone outreach was attempted today to offer the patient information about available care coordination services. Contact answering phone states patient is not available and request call back at another time.   Follow Up Plan:  Additional outreach attempts will be made to offer the patient care coordination information and services.   Encounter Outcome:  Contact request call back   Care Coordination Interventions:  No, not indicated    George Ina [facility] Care Coordination [phone] direct line

## 2022-07-31 ENCOUNTER — Other Ambulatory Visit: Payer: Self-pay | Admitting: Family Medicine

## 2022-07-31 NOTE — Telephone Encounter (Signed)
Refill request for gabapentin 300 mg capsule   LOV - 07/09/22 Next OV - not scheduled Last refill - 03/05/22 #90/1

## 2022-08-14 ENCOUNTER — Emergency Department (HOSPITAL_COMMUNITY)
Admission: EM | Admit: 2022-08-14 | Discharge: 2022-08-14 | Disposition: A | Discharge status: Home / Self Care | Payer: Medicare HMO | Attending: Emergency Medicine | Admitting: Emergency Medicine

## 2022-08-14 ENCOUNTER — Emergency Department (HOSPITAL_COMMUNITY): Payer: Medicare HMO

## 2022-08-14 ENCOUNTER — Telehealth: Payer: Self-pay | Admitting: Family Medicine

## 2022-08-14 ENCOUNTER — Other Ambulatory Visit: Payer: Self-pay

## 2022-08-14 DIAGNOSIS — R1011 Right upper quadrant pain: Secondary | ICD-10-CM | POA: Insufficient documentation

## 2022-08-14 DIAGNOSIS — R739 Hyperglycemia, unspecified: Secondary | ICD-10-CM | POA: Insufficient documentation

## 2022-08-14 DIAGNOSIS — Z794 Long term (current) use of insulin: Secondary | ICD-10-CM | POA: Diagnosis not present

## 2022-08-14 DIAGNOSIS — E876 Hypokalemia: Secondary | ICD-10-CM | POA: Diagnosis not present

## 2022-08-14 DIAGNOSIS — E669 Obesity, unspecified: Secondary | ICD-10-CM | POA: Insufficient documentation

## 2022-08-14 DIAGNOSIS — R109 Unspecified abdominal pain: Secondary | ICD-10-CM | POA: Diagnosis not present

## 2022-08-14 DIAGNOSIS — Z91010 Allergy to peanuts: Secondary | ICD-10-CM | POA: Diagnosis not present

## 2022-08-14 DIAGNOSIS — K760 Fatty (change of) liver, not elsewhere classified: Secondary | ICD-10-CM | POA: Diagnosis not present

## 2022-08-14 DIAGNOSIS — E1165 Type 2 diabetes mellitus with hyperglycemia: Secondary | ICD-10-CM | POA: Diagnosis not present

## 2022-08-14 LAB — URINALYSIS, ROUTINE W REFLEX MICROSCOPIC
Bilirubin Urine: NEGATIVE
Glucose, UA: >=500 mg/dL — AB
Hgb urine dipstick: NEGATIVE
Ketones, ur: NEGATIVE mg/dL
Nitrite: NEGATIVE
Protein, ur: NEGATIVE mg/dL
Specific Gravity, Urine: 1.013 (ref 1.005–1.030)
pH: 6 (ref 5.0–8.0)

## 2022-08-14 LAB — COMPREHENSIVE METABOLIC PANEL
ALT: 25 U/L (ref 0–44)
AST: 34 U/L (ref 15–41)
Albumin: 3.4 g/dL — ABNORMAL LOW (ref 3.5–5.0)
Alkaline Phosphatase: 103 U/L (ref 38–126)
Anion gap: 12 (ref 5–15)
BUN: 23 mg/dL (ref 8–23)
CO2: 34 mmol/L — ABNORMAL HIGH (ref 22–32)
Calcium: 9.4 mg/dL (ref 8.9–10.3)
Chloride: 91 mmol/L — ABNORMAL LOW (ref 98–111)
Creatinine, Ser: 1.33 mg/dL — ABNORMAL HIGH (ref 0.44–1.00)
GFR, Estimated: 44 mL/min — ABNORMAL LOW (ref >60)
Glucose, Bld: 300 mg/dL — ABNORMAL HIGH (ref 70–99)
Potassium: 3.3 mmol/L — ABNORMAL LOW (ref 3.5–5.1)
Sodium: 137 mmol/L (ref 135–145)
Total Bilirubin: 1 mg/dL (ref 0.3–1.2)
Total Protein: 6.7 g/dL (ref 6.5–8.1)

## 2022-08-14 LAB — CBC
HCT: 43 % (ref 36.0–46.0)
Hemoglobin: 14.2 g/dL (ref 12.0–15.0)
MCH: 30.3 pg (ref 26.0–34.0)
MCHC: 33 g/dL (ref 30.0–36.0)
MCV: 91.7 fL (ref 80.0–100.0)
Platelets: 258 10*3/uL (ref 150–400)
RBC: 4.69 MIL/uL (ref 3.87–5.11)
RDW: 14.6 % (ref 11.5–15.5)
WBC: 10.2 10*3/uL (ref 4.0–10.5)
nRBC: 0 % (ref 0.0–0.2)

## 2022-08-14 LAB — LIPASE, BLOOD: Lipase: 28 U/L (ref 11–51)

## 2022-08-14 MED ORDER — IOHEXOL 350 MG/ML SOLN
60.0000 mL | Freq: Once | INTRAVENOUS | Status: AC | PRN
  Administered 2022-08-14: 60 mL via INTRAVENOUS

## 2022-08-14 NOTE — Telephone Encounter (Signed)
Patient contacted the office requesting a call back from clinical staff. States she has been experiencing some R side pain since last night, it is becoming more frequent ad more painful. Patient also says she is due to take her ozempic dose this evening, and wants to know if it is recommended that she take it this evening. Please advise, thank you

## 2022-08-14 NOTE — Telephone Encounter (Signed)
I spoke with pt;starting 08/13/22 pt has rt side pain at waistline that comes and goes but in last hour has had 4 - 5 episodes of sharp pain with pain level of 7 - 8. Pain pt does not have appendix or GB. Pt is not sure if fever or not. Pt had loose stool with no blood or mucus on 08/13/22. No urinary symptoms. Pt has no N&V but did have N&V last wk. In the last 10 mins pt has had 3 episodes of sharp rt sided pain. Pt is going to [facility] ED now. Pt will ck with ED doctor or call endo about ozempic. Sending note to Dr Para March and Para March pool.

## 2022-08-14 NOTE — ED Provider Notes (Signed)
[place] EMERGENCY DEPARTMENT AT [facility] Provider Note   CSN: [id number] Arrival date & time: 08/14/22  1655     History  Chief Complaint  Patient presents with   Abdominal Pain    Kathleen Hatfield is a 67 y.o. female presented to ED abdominal pain.  Patient reports onset of abdominal pain yesterday, right flank pain, colicky, sharp, intermittent.  Associated with nausea.  She reports a regular bowel movement today and no issues with constipation recently.  She reports extensive abdominal surgical history including cholecystectomy, appendectomy, sigmoid colon resection, hernia repair with mesh, and hysterectomy.  HPI     Home Medications Prior to Admission medications   Medication Sig Start Date End Date Taking? Authorizing Provider  acetaminophen (TYLENOL) 500 MG tablet Take 500 mg by mouth every 6 (six) hours as needed for moderate pain.    [provider]  albuterol (VENTOLIN HFA) 108 (90 Base) MCG/ACT inhaler INHALE TWO PUFFS BY MOUTH INTO LUNGS every SIX hours AS NEEDED 03/26/22   Joaquim Nam, MD  Alirocumab (PRALUENT) 150 MG/ML SOAJ Inject 1 mL (150 mg total) into the skin every 14 (fourteen) days. 06/26/22   Joaquim Nam, MD  aspirin EC 81 MG tablet Take 1 tablet (81 mg total) by mouth daily. Swallow whole. 06/04/21   Tereso Newcomer T, PA-C  BAYER CONTOUR NEXT TEST test strip 1 each by Other route daily. As directed 07/30/15   [provider]  Budeson-Glycopyrrol-Formoterol (BREZTRI AEROSPHERE) 160-9-4.8 MCG/ACT AERO Inhale 2 puffs into the lungs in the morning and at bedtime. 07/21/22   Joaquim Nam, MD  buPROPion (WELLBUTRIN XL) 300 MG 24 hr tablet TAKE ONE TABLET BY MOUTH EVERY MORNING 02/03/22   Joaquim Nam, MD  Cholecalciferol (VITAMIN D-3) 5000 UNITS TABS Take 5,000 Units by mouth every other day.     [provider]  colchicine 0.6 MG tablet TAKE 1 TABLET (0.6 MG TOTAL) BY MOUTH DAILY AS NEEDED (FOR GOUT) 07/20/22   Joaquim Nam, MD  Continuous Blood Gluc Receiver (DEXCOM G7 RECEIVER) DEVI by Does not apply route.    [provider]  Continuous Blood Gluc Sensor (DEXCOM G7 SENSOR) MISC 1 Device by Does not apply route as directed. Apply sensor every 10 days 03/31/22   Shamleffer, Konrad Dolores, MD  dicyclomine (BENTYL) 20 MG tablet TAKE 1 TABLET UP TO FOUR TIMES A DAY FOR GI CRAMPING, PAIN, NAUSEA, AND VOMITING AS NEEDED 03/14/21   Joaquim Nam, MD  DULoxetine (CYMBALTA) 60 MG capsule TAKE ONE CAPSULE BY MOUTH EVERYDAY AT BEDTIME 04/02/22   Joaquim Nam, MD  estradiol (ESTRACE) 0.1 MG/GM vaginal cream Place 1 Applicatorful vaginally 3 (three) times a week.    [provider]  FEROSUL 325 (65 Fe) MG tablet TAKE ONE TABLET BY MOUTH every other evening 05/01/22   Joaquim Nam, MD  fluticasone [facility]) 50 MCG/ACT nasal spray Place 2 sprays into both nostrils daily. 02/08/21   Marita Kansas, PA-C  furosemide (LASIX) 20 MG tablet Take 3 tablets (60 mg total) by mouth 2 (two) times daily. 05/01/22   Tereso Newcomer T, PA-C  gabapentin (NEURONTIN) 300 MG capsule TAKE ONE CAPSULE BY MOUTH EVERYDAY AT BEDTIME May take additional capsule daily if needed 08/03/22   Joaquim Nam, MD  insulin aspart (NOVOLOG FLEXPEN) 100 UNIT/ML FlexPen Max daily 45 units 03/31/22   Shamleffer, Konrad Dolores, MD  insulin degludec (TRESIBA FLEXTOUCH) 200 UNIT/ML FlexTouch Pen Inject 98 Units into  the skin daily. 07/09/22   Joaquim Nam, MD  Insulin Pen Needle (GLOBAL EASE INJECT PEN NEEDLES) 32G X 4 MM MISC 1 Device by Other route in the morning, at noon, in the evening, and at bedtime. 03/31/22   Shamleffer, Konrad Dolores, MD  ipratropium-albuterol (DUONEB) 0.5-2.5 (3) MG/3ML SOLN Take 3 mLs by nebulization every 6 (six) hours as needed (wheezing). 02/10/21   Coralyn Helling, MD  ketoconazole (NIZORAL) 2 % cream APPLY TO AFFECTED AREA TWICE A DAY 04/29/22   Joaquim Nam, MD  meloxicam (MOBIC) 7.5 MG tablet Take 1 tablet  (7.5 mg total) by mouth daily as needed for pain. 05/22/22   Joaquim Nam, MD  metolazone (ZAROXOLYN) 5 MG tablet TAKE 1 TABLET BY MOUTH EVERY DAY AS NEEDED 12/01/21   Joaquim Nam, MD  MICROLET LANCETS MISC 1 each by Other route. As directed 07/30/15   [provider]  nitrofurantoin (MACRODANTIN) 50 MG capsule Take 50 mg by mouth daily. 10/08/21   [provider]  nystatin (MYCOSTATIN/NYSTOP) powder Apply 1 application topically 3 (three) times daily. 09/26/20   Joaquim Nam, MD  ondansetron (ZOFRAN) 4 MG tablet TAKE ONE TABLET BY MOUTH every EIGHT hours AS NEEDED FOR NAUSEA AND VOMITING 05/22/22   Joaquim Nam, MD  OXYGEN Inhale 2 L into the lungs continuous.    [provider]  pantoprazole (PROTONIX) 40 MG tablet TAKE ONE TABLET BY MOUTH EVERY MORNING 05/25/22   Joaquim Nam, MD  polyethylene glycol [facility] Ethelene Hal) packet Take 17 g by mouth daily as needed for mild constipation.    [provider]  potassium chloride (MICRO-K) 10 MEQ CR capsule Take 5 capsules (50 mEq total) by mouth daily. 12/10/21   Joaquim Nam, MD  rOPINIRole (REQUIP) 4 MG tablet TAKE 1 TABLET BY MOUTH EVERYDAY AT BEDTIME 07/09/22   Joaquim Nam, MD  Semaglutide, 2 MG/DOSE, 8 MG/3ML SOPN Inject 2 mg as directed once a week. 07/29/22   Joaquim Nam, MD  simethicone (GAS-X) 80 MG chewable tablet Chew 1 tablet (80 mg total) by mouth every 6 (six) hours as needed for flatulence. 11/28/21   Joaquim Nam, MD  spironolactone (ALDACTONE) 25 MG tablet Take 0.5 tablets (12.5 mg total) by mouth daily. 05/26/22   Joaquim Nam, MD  traMADol (ULTRAM) 50 MG tablet Take 1 tablet (50 mg total) by mouth 3 (three) times daily as needed. 11/07/21   Joaquim Nam, MD  UNABLE TO FIND CPAP- At bedtime    [provider]      Allergies    Almond oil, Morphine and codeine, Atorvastatin, Ceclor [cefaclor], Sulfa antibiotics, Ciprofloxacin, Levaquin [levofloxacin],  Losartan, Peanut-containing drug products, Primidone, Repatha [evolocumab], and Statins    Review of Systems   Review of Systems  Physical Exam Updated Vital Signs BP (!) 156/91   Pulse 81   Temp 98.2 F (36.8 C) (Oral)   Resp 20   SpO2 94%  Physical Exam Constitutional:      General: She is not in acute distress.    Appearance: She is obese.  HENT:     Head: Normocephalic and atraumatic.  Eyes:     Conjunctiva/sclera: Conjunctivae normal.     Pupils: Pupils are equal, round, and reactive to light.  Cardiovascular:     Rate and Rhythm: Normal rate and regular rhythm.  Pulmonary:     Effort: Pulmonary effort is normal. No respiratory distress.  Abdominal:  General: There is no distension.     Tenderness: There is abdominal tenderness in the right upper quadrant. There is no guarding.  Skin:    General: Skin is warm and dry.  Neurological:     General: No focal deficit present.     Mental Status: She is alert. Mental status is at baseline.  Psychiatric:        Mood and Affect: Mood normal.        Behavior: Behavior normal.     ED Results / Procedures / Treatments   Labs (all labs ordered are listed, but only abnormal results are displayed) Labs Reviewed  COMPREHENSIVE METABOLIC PANEL - Abnormal; Notable for the following components:      Result Value   Potassium 3.3 (*)    Chloride 91 (*)    CO2 34 (*)    Glucose, Bld 300 (*)    Creatinine, Ser 1.33 (*)    Albumin 3.4 (*)    GFR, Estimated 44 (*)    All other components within normal limits  URINALYSIS, ROUTINE W REFLEX MICROSCOPIC - Abnormal; Notable for the following components:   Glucose, UA >=500 (*)    Leukocytes,Ua MODERATE (*)    Bacteria, UA RARE (*)    All other components within normal limits  LIPASE, BLOOD  CBC    EKG None  Radiology CT ABDOMEN PELVIS W CONTRAST  Result Date: 08/14/2022 CLINICAL DATA:  Acute nonlocalized abdominal pain. Multiple prior abdominal surgeries. Right flank  pain with nausea and vomiting. Right upper quadrant rib pain. EXAM: CT ABDOMEN AND PELVIS WITH CONTRAST TECHNIQUE: Multidetector CT imaging of the abdomen and pelvis was performed using the standard protocol following bolus administration of intravenous contrast. RADIATION DOSE REDUCTION: This exam was performed according to the departmental dose-optimization program which includes automated exposure control, adjustment of the mA and/or kV according to patient size and/or use of iterative reconstruction technique. CONTRAST:  60mL OMNIPAQUE IOHEXOL 350 MG/ML SOLN COMPARISON:  Renal ultrasound 10/08/2021. CT abdomen and pelvis 04/14/2015 FINDINGS: Lower chest: Tiny peripheral peribronchial nodules are demonstrated in the lung bases, measuring less than 2 mm each. These are likely inflammatory. Hepatobiliary: Diffuse fatty infiltration of the liver. No focal liver lesions. Surgical absence of the gallbladder. No bile duct dilatation. Pancreas: Diffuse fatty replacement of the pancreas. No mass or acute abnormality. Spleen: Normal in size without focal abnormality. Adrenals/Urinary Tract: Adrenal glands are unremarkable. Kidneys are normal, without renal calculi, focal lesion, or hydronephrosis. Bladder is unremarkable. Stomach/Bowel: Stomach is within normal limits. Appendix appears normal. No evidence of bowel wall thickening, distention, or inflammatory changes. Vascular/Lymphatic: Aortic atherosclerosis. No enlarged abdominal or pelvic lymph nodes. Reproductive: No pelvic mass. Other: No free air or free fluid in the abdomen. Abdominal wall musculature appears intact. Scarring in the anterior abdominal wall consistent with postoperative change. Previous ventral hernias have been repaired. No significant recurrence. Musculoskeletal: No acute or significant osseous findings. IMPRESSION: 1. No acute process demonstrated in the abdomen or pelvis. No evidence of bowel obstruction or inflammation. 2. Tiny peripheral  peribronchial nodules demonstrated in the lung bases suggesting inflammatory process. 3. Diffuse fatty infiltration of the liver. 4. Aortic atherosclerosis. 5. Interval abdominal wall hernia repairs without recurrence. Electronically Signed   By: Burman Nieves M.D.   On: 08/14/2022 23:26    Procedures Procedures    Medications Ordered in ED Medications  iohexol (OMNIPAQUE) 350 MG/ML injection 60 mL (60 mLs Intravenous Contrast Given 08/14/22 2305)    ED Course/ Medical Decision  Making/ A&P Clinical Course as of 08/15/22 0028  Caleen Essex Aug 14, 2022  2338 Patient reassessed abdominal pain remains minimal.  No emergent findings on CT imaging.  She was notified about her hyperglycemia and mild hypokalemia.  She can address both of these at home, uses insulin and titrate to, and also has had hypokalemia in the past.  She has supplements at home.  Okay for discharge [MT]    Clinical Course User Index [MT] Duke Weisensel, Kermit Balo, MD                             Medical Decision Making Amount and/or Complexity of Data Reviewed Labs: ordered. Radiology: ordered.  Risk Prescription drug management.   This patient presents to the ED with concern for colicky abdominal pain. This involves an extensive number of treatment options, and is a complaint that carries with it a high risk of complications and morbidity.  The differential diagnosis includes ureteral colic versus pancreatitis versus colitis versus internal hernia complication vs other  Co-morbidities that complicate the patient evaluation: History of colitis , at risk of recurrence   I ordered and personally interpreted labs.  The pertinent results include: Mild hypokalemia and hyperglycemia noted.  UA with moderate leukocytes, negative nitrites.  White blood cell count and hemoglobin within normal limits.  Lipase normal.  No transaminitis  I ordered imaging studies including CT abdomen pelvis I independently visualized and interpreted imaging  which showed fatty liver, chronic hernia repair, no emergent findings to explain abdominal symptoms.  There was question about "inflammatory process" within the lungs with the patient is not having respiratory symptoms.  I think this is likely an incidental finding. I agree with the radiologist interpretation  The patient was maintained on a cardiac monitor.  I personally viewed and interpreted the cardiac monitored which showed an underlying rhythm of: Sinus rhythm  Test Considered: Low suspicion for AAA, pneumonia.  No indication for angiogram imaging or x-ray imaging of the chest at this time.  After the interventions noted above, I reevaluated the patient and found that they have: stayed the same   Dispostion:  After consideration of the diagnostic results and the patients response to treatment, I feel that the patent would benefit from close outpatient follow-up.         Final Clinical Impression(s) / ED Diagnoses Final diagnoses:  Abdominal pain, unspecified abdominal location  Hyperglycemia  Hypokalemia    Rx / DC Orders ED Discharge Orders     None         Surya Folden, Kermit Balo, MD 08/15/22 0028

## 2022-08-14 NOTE — Discharge Instructions (Addendum)
Please follow-up with your primary care doctor next week for reevaluation of your abdominal pain.

## 2022-08-14 NOTE — ED Triage Notes (Signed)
Patient reports intermittent R sided abdominal pain since yesterday. Endorses nausea last week but not at present.

## 2022-08-14 NOTE — ED Notes (Signed)
Patient transported to CT 

## 2022-08-14 NOTE — ED Notes (Signed)
Patient states pain is in the RUQ/below rib. Rates pain 3/10 at this time.

## 2022-08-14 NOTE — Telephone Encounter (Signed)
Noted. Will await ER report.  Thanks.  °

## 2022-08-18 ENCOUNTER — Ambulatory Visit: Payer: Medicare HMO | Admitting: Podiatry

## 2022-08-18 ENCOUNTER — Ambulatory Visit (INDEPENDENT_AMBULATORY_CARE_PROVIDER_SITE_OTHER): Payer: Medicare HMO

## 2022-08-18 DIAGNOSIS — M79672 Pain in left foot: Secondary | ICD-10-CM

## 2022-08-18 DIAGNOSIS — M79671 Pain in right foot: Secondary | ICD-10-CM

## 2022-08-18 DIAGNOSIS — M722 Plantar fascial fibromatosis: Secondary | ICD-10-CM

## 2022-08-18 MED ORDER — BETAMETHASONE SOD PHOS & ACET 6 (3-3) MG/ML IJ SUSP
3.0000 mg | Freq: Once | INTRAMUSCULAR | Status: AC
Start: 2022-08-18 — End: 2022-08-18
  Administered 2022-08-18: 3 mg via INTRA_ARTICULAR

## 2022-08-18 MED ORDER — MELOXICAM 15 MG PO TABS: 15.0000 mg | ORAL_TABLET | Freq: Every day | ORAL | 1 refills | Status: DC

## 2022-08-18 NOTE — Progress Notes (Signed)
   Chief Complaint  Patient presents with   Foot Pain    Bilateral foot pain, forefoot, heels, started 2 months ago,     Subjective: 67 y.o. female PMHx T2DM presenting today for evaluation of bilateral heel pain.  Patient has a history of chronic intermittent foot pain.  Patient states that a few months ago she developed bilateral heel pain secondary to activity.  The pain has persisted over the past few months and she presents today for further treatment evaluation.  Currently she wears good supportive tennis shoes and sneakers.   Past Medical History:  Diagnosis Date   Anemia    Arthritis    Asthma    Chronic diastolic CHF 05/2014   Echocardiogram 05/2019: EF 70, no RWMA, mild LVH, Gr 1 DD, normal RVSF, severe LVH, borderline asc Aorta (39 mm)   CKD (chronic kidney disease)    Depression    Diabetes mellitus without complication (HCC)    Diverticulitis    Dyspnea    with exertion   GERD (gastroesophageal reflux disease)    History of blood transfusion    Hyperlipidemia    cannot tolerate statins   Hypertension    patient states she has never had high blood pressure.    Nuclear stress test    Myoview 05/2019: EF 83, no ischemia or infarction; Low Risk   Peripheral neuropathy    Pneumonia    PONV (postoperative nausea and vomiting)    RLS (restless legs syndrome)    Sleep apnea    uses CPAP   Ulcerative colitis (HCC)    dr [address]     Objective: Physical Exam General: The patient is alert and oriented x3 in no acute distress.  Dermatology: Skin is warm, dry and supple bilateral lower extremities. Negative for open lesions or macerations bilateral.   Vascular: Dorsalis Pedis and Posterior Tibial pulses palpable bilateral.  Capillary fill time is immediate to all digits.  Neurological: Epicritic and protective threshold intact bilateral.   Musculoskeletal: Tenderness to palpation to the plantar aspect of the bilateral heels along the plantar fascia. All other joints  range of motion within normal limits bilateral. Strength 5/5 in all groups bilateral.   Radiographic exam B/L feet 08/18/2022: No heel spurs noted.  Collapse of the medial longitudinal arch of the foot noted bilateral.  No acute fractures identified.  Assessment: 1. plantar fasciitis bilateral feet  Plan of Care:  -Patient evaluated. Xrays reviewed.   -Injection of 0.5cc Celestone soluspan injected into the bilateral heels.  -Prescription for meloxicam 15 mg daily -Instructed patient regarding therapies and modalities at home to alleviate symptoms.  -Return to clinic as needed  Felecia Shelling, DPM [facility]  Dr. Felecia Shelling, DPM    [address] [place], [place] [address]                Office [phone]  Fax [phone]

## 2022-08-19 ENCOUNTER — Telehealth: Payer: Self-pay | Admitting: *Deleted

## 2022-08-19 NOTE — Telephone Encounter (Signed)
Transition Care Management Follow-up Telephone Call Date of discharge and from where: [facility] How have you been since you were released from the hospital? Doing well taking extra potassium , will see my dr if it happens again  Any questions or concerns? No  Items Reviewed: Did the pt receive and understand the discharge instructions provided? Yes  Medications obtained and verified? No  Other? No  Any new allergies since your discharge? No  Dietary orders reviewed? No Do you have support at home? Yes     Follow up appointments reviewed:  PCP Hospital f/u appt confirmed? No   will see my dr if it happens again Are transportation arrangements needed? No  If their condition worsens, is the pt aware to call PCP or go to the Emergency Dept.? Yes Was the patient provided with contact information for the PCP's office or ED? Yes Was to pt encouraged to call back with questions or concerns? Yes

## 2022-08-20 ENCOUNTER — Encounter: Payer: Medicare HMO | Admitting: Pharmacist

## 2022-08-20 DIAGNOSIS — J449 Chronic obstructive pulmonary disease, unspecified: Secondary | ICD-10-CM | POA: Diagnosis not present

## 2022-08-27 ENCOUNTER — Other Ambulatory Visit: Payer: Medicare HMO

## 2022-08-27 DIAGNOSIS — M79673 Pain in unspecified foot: Secondary | ICD-10-CM

## 2022-08-27 NOTE — Progress Notes (Signed)
08/27/2022 Name: Kathleen Hatfield MRN: [id number] DOB: [birth year]  Chief Complaint  Patient presents with   Medication Management   Kathleen Hatfield is a 67 y.o. year old female who presented for a telephone visit to transfer care to [facility]   Subjective:  Care Team: Primary Care Provider: Joaquim Nam, MD Endocrinologist [facility]; Next Scheduled Visit: 10/06/22  Medication Access/Adherence Current Pharmacy: [facility] -Patient reports affordability concerns with their medications: No  -Patient reports access/transportation concerns to their pharmacy: No Medications delivered to home -Patient reports adherence concerns with their medications:  Yes  patient states she is needing refills on Praluent, ondansetron, potassium, and tramadol.  She also requests a prescription for nitrofurantoin and vitamin D daily.  Diabetes: Current medications: Novolog TID up to 45 units daily, Tresiba 160 units daily, Ozempic 2mg  weekly -Current glucose readings: routinely >200 -Using Dexcom G7 for CGM -Patient states she is using 160 units of Tresiba daily due to elevated BG readings -Endorses forgetting doses of Novolog -Patient reports hypoglycemic s/sx including dizziness, shakiness, sweating on occasion. -Patient denies hyperglycemic symptoms including polyuria, polydipsia, polyphagia, nocturia, neuropathy, blurred vision.  Hypertension: Current medications: furosemide 60mg  BID, spironolactone 25mg  daily  -Medications previously tried: losartan caused weakness -Patient does not regularly check home BP -156/91 during recent ED visit, but previous reading has been wnl  Hyperlipidemia/ASCVD Risk Reduction Current lipid lowering medications: Praluent 150mg  every other week Antiplatelet regimen: ASA 81mg  daily  -Patient cannot tolerate statins due to muscle pain/weakness  Objective: Lab Results  Component Value Date   HGBA1C 11.1 (H) 06/26/2022   Lab Results  Component Value Date    CREATININE 1.33 (H) 08/14/2022   BUN 23 08/14/2022   NA 137 08/14/2022   K 3.3 (L) 08/14/2022   CL 91 (L) 08/14/2022   CO2 34 (H) 08/14/2022   Lab Results  Component Value Date   CHOL 202 (H) 11/25/2021   HDL 47 11/25/2021   LDLCALC 105 (H) 11/25/2021   LDLDIRECT 96 02/25/2022   TRIG 297 (H) 11/25/2021   CHOLHDL 4.3 11/25/2021   Medications Reviewed Today     Reviewed by Lenna Gilford, RPH (Pharmacist) on 08/27/22 at 1343  Med List Status: <None>   Medication Order Taking? Sig Documenting Provider Last Dose Status Informant  acetaminophen (TYLENOL) 500 MG tablet [id number] Yes Take 500 mg by mouth every 6 (six) hours as needed for moderate pain. [provider] Taking Active Self  albuterol (VENTOLIN HFA) 108 (90 Base) MCG/ACT inhaler [id number] Yes INHALE TWO PUFFS BY MOUTH INTO LUNGS every SIX hours AS NEEDED Joaquim Nam, MD Taking Active   Alirocumab (PRALUENT) 150 MG/ML Ivory Broad [id number] Yes Inject 1 mL (150 mg total) into the skin every 14 (fourteen) days. Joaquim Nam, MD Taking Active   aspirin EC 81 MG tablet [id number] Yes Take 1 tablet (81 mg total) by mouth daily. Swallow whole. Tereso Newcomer T, PA-C Taking Active Self  BAYER CONTOUR NEXT TEST test strip [id number] Yes 1 each by Other route daily. As directed [provider] Taking Active Self           Med Note Katrinka Blazing, Merrily Pew   Mon May 03, 2017 11:02 PM)    buPROPion (WELLBUTRIN XL) 300 MG 24 hr tablet [id number] Yes TAKE ONE TABLET BY MOUTH EVERY MORNING Joaquim Nam, MD Taking Active   Cholecalciferol (VITAMIN D-3) 5000 UNITS TABS [id number] Yes Take 5,000 Units by mouth every other day.  [provider] Taking Active  Self  colchicine 0.6 MG tablet [id number] Yes TAKE 1 TABLET (0.6 MG TOTAL) BY MOUTH DAILY AS NEEDED (FOR GOUT) Joaquim Nam, MD Taking Active   Continuous Blood Gluc Receiver [facility] G7 RECEIVER) DEVI [id number] Yes by Does not apply route. [provider]  Taking Active Self  Continuous Blood Gluc Sensor (DEXCOM G7 SENSOR) MISC [id number] Yes 1 Device by Does not apply route as directed. Apply sensor every 10 days Shamleffer, Konrad Dolores, MD Taking Active   dicyclomine (BENTYL) 20 MG tablet [id number] Yes TAKE 1 TABLET UP TO FOUR TIMES A DAY FOR GI CRAMPING, PAIN, NAUSEA, AND VOMITING AS NEEDED Joaquim Nam, MD Taking Active Self  DULoxetine (CYMBALTA) 60 MG capsule [id number] Yes TAKE ONE CAPSULE BY MOUTH EVERYDAY AT BEDTIME Joaquim Nam, MD Taking Active   estradiol (ESTRACE) 0.1 MG/GM vaginal cream [id number] Yes Place 1 Applicatorful vaginally 3 (three) times a week. [provider] Taking Active Self           Med Note Littie Deeds, Taylr Meuth A   Thu Aug 27, 2022  1:30 PM) When she remembers  FEROSUL 325 (65 Fe) MG tablet [id number] Yes TAKE ONE TABLET BY MOUTH every other evening Joaquim Nam, MD Taking Active   fluticasone [facility]) 50 MCG/ACT nasal spray [id number] Yes Place 2 sprays into both nostrils daily. Marita Kansas, PA-C Taking Active Self  furosemide (LASIX) 20 MG tablet [id number] Yes Take 3 tablets (60 mg total) by mouth 2 (two) times daily. Tereso Newcomer T, PA-C Taking Active   gabapentin (NEURONTIN) 300 MG capsule [id number] Yes TAKE ONE CAPSULE BY MOUTH EVERYDAY AT BEDTIME May take additional capsule daily if needed Joaquim Nam, MD Taking Active            Med Note Littie Deeds, [facility] A   Thu Aug 27, 2022  1:32 PM) Can take 1 in the morning if needed while decreasing ropinorole  insulin aspart (NOVOLOG FLEXPEN) 100 UNIT/ML FlexPen [id number] Yes Max daily 45 units Shamleffer, Konrad Dolores, MD Taking Active            Med Note Littie Deeds, [employer] A   Thu Aug 27, 2022  1:33 PM) Forgets from time-to-time  insulin degludec (TRESIBA FLEXTOUCH) 200 UNIT/ML FlexTouch Pen [id number] Yes Inject 98 Units into the skin daily. Joaquim Nam, MD Taking Active            Med Note Littie Deeds, [facility] A   Thu Aug 27, 2022  1:34 PM) 160 units  once daily  Insulin Pen Needle (GLOBAL EASE INJECT PEN NEEDLES) 32G X 4 MM MISC [id number] Yes 1 Device by Other route in the morning, at noon, in the evening, and at bedtime. Shamleffer, Konrad Dolores, MD Taking Active   ipratropium-albuterol (DUONEB) 0.5-2.5 (3) MG/3ML SOLN [id number] Yes Take 3 mLs by nebulization every 6 (six) hours as needed (wheezing). Coralyn Helling, MD Taking Active Self  ketoconazole (NIZORAL) 2 % cream [id number] Yes APPLY TO AFFECTED AREA TWICE A DAY Joaquim Nam, MD Taking Active            Med Note Littie Deeds, [facility] A   Thu Aug 27, 2022  1:35 PM) As needed  meloxicam (MOBIC) 15 MG tablet [id number] Yes Take 1 tablet (15 mg total) by mouth daily. Felecia Shelling, DPM Taking Active            Med Note Littie Deeds, Ibrahem Volkman A   Thu Aug 27, 2022  1:35 PM) As needed  metolazone (ZAROXOLYN) 5 MG tablet  [id number] Yes TAKE 1 TABLET BY MOUTH EVERY DAY AS NEEDED Joaquim Nam, MD Taking Active   MICROLET LANCETS MISC [id number] Yes 1 each by Other route. As directed [provider] Taking Active Self           Med Note Sharlynn Oliphant Oct 21, 2015  3:31 PM)    nitrofurantoin (MACRODANTIN) 50 MG capsule [id number] Yes Take 50 mg by mouth daily. [provider] Taking Active Self           Med Note Para March, Dwana Curd   Fri Nov 07, 2021  2:27 PM)    nystatin (MYCOSTATIN/NYSTOP) powder [id number] Yes Apply 1 application topically 3 (three) times daily. Joaquim Nam, MD Taking Active Self  ondansetron (ZOFRAN) 4 MG tablet [id number] Yes TAKE ONE TABLET BY MOUTH every EIGHT hours AS NEEDED FOR NAUSEA AND VOMITING Joaquim Nam, MD Taking Active   OXYGEN [id number] Yes Inhale 2 L into the lungs continuous. [provider] Taking Active Self  pantoprazole (PROTONIX) 40 MG tablet [id number] Yes TAKE ONE TABLET BY MOUTH EVERY MORNING Joaquim Nam, MD Taking Active   polyethylene glycol [facility] / GLYCOLAX) packet [id number] Yes Take 17 g by mouth daily as  needed for mild constipation. [provider] Taking Active Self  potassium chloride (MICRO-K) 10 MEQ CR capsule [id number] Yes Take 5 capsules (50 mEq total) by mouth daily. Joaquim Nam, MD Taking Active   rOPINIRole (REQUIP) 4 MG tablet [id number] Yes TAKE 1 TABLET BY MOUTH EVERYDAY AT BEDTIME Joaquim Nam, MD Taking Active   Semaglutide, 2 MG/DOSE, 8 MG/3ML SOPN [id number] Yes Inject 2 mg as directed once a week. Joaquim Nam, MD Taking Active   simethicone (GAS-X) 80 MG chewable tablet [id number] Yes Chew 1 tablet (80 mg total) by mouth every 6 (six) hours as needed for flatulence. Joaquim Nam, MD Taking Active   spironolactone (ALDACTONE) 25 MG tablet [id number] Yes Take 0.5 tablets (12.5 mg total) by mouth daily. Joaquim Nam, MD Taking Active   traMADol Janean Sark) 50 MG tablet [id number] Yes Take 1 tablet (50 mg total) by mouth 3 (three) times daily as needed. Joaquim Nam, MD Taking Active   UNABLE TO FIND [id number] Yes CPAP- At bedtime [provider] Taking Active Self           Assessment/Plan:   Medication Access/Adherence -Contacting [facility] to check on refills for Praluent, ondansetron, potassium and tramadol.  If refills do not remain, will consult Dr. Para March about refills -Will also see if pharmacy has record/refills for nitrofurantoin.  It appears this has been prescribed by urology as daily dosing for UTI prevention.  If no refills remain, will ask pharmacy to request from provider.  Diabetes: - Currently uncontrolled - Patient is followed by Dr. Lonzo Cloud with endocrinology and has an upcoming appointment 10/06/22.  Will forward note to provider, so they are aware of current BG readings, dose of Tresiba, and lack of adherence to meal-time insulin  Hypertension: - Currently controlled - Reviewed long term cardiovascular and renal outcomes of uncontrolled blood pressure - Reviewed appropriate blood pressure monitoring technique and  reviewed goal blood pressure.                      - Recommended to check home blood pressure and heart rate daily - Recommend to continue current regimen and regular follow-up with PCP   Hyperlipidemia/ASCVD Risk Reduction: - Currently uncontrolled.  -  Checking on adherence/availability of Praluent with pharmacy- notes indicate coverage and medication change around the time last labs were drawn, so there could have been gaps in therapy - Recommend Lipid panel at next visit  Follow Up Plan: None scheduled but can as needed per PCP  Lenna Gilford, PharmD, DPLA

## 2022-08-31 NOTE — Progress Notes (Unsigned)
   08/31/2022  Patient ID: Trudee Grip, female   DOB: 1955/03/20, 67 y.o.   MRN: [id number]  Contacted [facility] and [facility]: -Patient had active refills on ondansetron, potassium, and nitrofurantoin that [facility] will fill and send out to patient -Neither pharmacy had an active prescription for Praluent, so I am pending order for Dr. Para March to sign for medication to go to [facility].  When signed, I will verify insurance coverage and copay with pharmacy -No active refills for tramadol; will consult Dr. Para March to see if he would like to renew  Lenna Gilford, PharmD, DPLA

## 2022-09-01 ENCOUNTER — Telehealth: Payer: Self-pay

## 2022-09-01 MED ORDER — PRALUENT 150 MG/ML ~~LOC~~ SOAJ: 150.0000 mg | SUBCUTANEOUS | 1 refills | Status: DC

## 2022-09-01 MED ORDER — TRAMADOL HCL 50 MG PO TABS
50.0000 mg | ORAL_TABLET | Freq: Three times a day (TID) | ORAL | 1 refills | Status: DC | PRN
Start: 2022-09-01 — End: 2022-11-03

## 2022-09-01 NOTE — Progress Notes (Signed)
I sent both.  Thanks. 

## 2022-09-01 NOTE — Progress Notes (Unsigned)
   09/01/2022  Patient ID: Kathleen Hatfield, female   DOB: 04/17/1955, 67 y.o.   MRN: [id number]  Patient outreach to discuss availability of medications.  -Dr. Para March sent orders in for Praluent and tramadol to [facility].   -Contacted pharmacy to verify insurance coverage of Praluent, and it is going through on insurance for $124.51 for 1 month.  Patient aware of/expecting this price -She received refills for ondansetron, potassium, and nitrofurantoin Tuesday and should receive Praluent and tramadol by the end of the week -Provided my direct number for any future medication/pharmacy needs  Lenna Gilford, PharmD, DPLA

## 2022-09-04 ENCOUNTER — Ambulatory Visit
Admission: RE | Admit: 2022-09-04 | Discharge: 2022-09-04 | Disposition: A | Discharge status: Home / Self Care | Payer: Medicare HMO | Source: Ambulatory Visit | Attending: Family Medicine | Admitting: Family Medicine

## 2022-09-04 DIAGNOSIS — Z1231 Encounter for screening mammogram for malignant neoplasm of breast: Secondary | ICD-10-CM

## 2022-09-04 DIAGNOSIS — G4733 Obstructive sleep apnea (adult) (pediatric): Secondary | ICD-10-CM | POA: Diagnosis not present

## 2022-09-07 DIAGNOSIS — E1165 Type 2 diabetes mellitus with hyperglycemia: Secondary | ICD-10-CM | POA: Diagnosis not present

## 2022-09-17 ENCOUNTER — Ambulatory Visit
Admission: EM | Admit: 2022-09-17 | Discharge: 2022-09-17 | Disposition: A | Discharge status: Home / Self Care | Payer: Medicare HMO | Attending: Family Medicine | Admitting: Family Medicine

## 2022-09-17 DIAGNOSIS — Z794 Long term (current) use of insulin: Secondary | ICD-10-CM | POA: Diagnosis not present

## 2022-09-17 DIAGNOSIS — R059 Cough, unspecified: Secondary | ICD-10-CM | POA: Insufficient documentation

## 2022-09-17 DIAGNOSIS — E1165 Type 2 diabetes mellitus with hyperglycemia: Secondary | ICD-10-CM | POA: Diagnosis not present

## 2022-09-17 DIAGNOSIS — R300 Dysuria: Secondary | ICD-10-CM | POA: Insufficient documentation

## 2022-09-17 DIAGNOSIS — N3000 Acute cystitis without hematuria: Secondary | ICD-10-CM | POA: Insufficient documentation

## 2022-09-17 LAB — POCT URINALYSIS DIP (MANUAL ENTRY)
Bilirubin, UA: NEGATIVE
Blood, UA: NEGATIVE
Glucose, UA: 250 mg/dL — AB
Ketones, POC UA: NEGATIVE mg/dL
Nitrite, UA: NEGATIVE
Protein Ur, POC: NEGATIVE mg/dL
Spec Grav, UA: 1.025 (ref 1.010–1.025)
Urobilinogen, UA: 0.2 E.U./dL
pH, UA: 5.5 (ref 5.0–8.0)

## 2022-09-17 MED ORDER — AMOXICILLIN-POT CLAVULANATE 875-125 MG PO TABS: 1.0000 | ORAL_TABLET | Freq: Two times a day (BID) | ORAL | 0 refills | Status: AC

## 2022-09-17 NOTE — Discharge Instructions (Addendum)
If you have aspirated, given your history of heart failure, asthma  I would recommend evaluation in the setting of the emergency department if you develop any shortness of breath or persistent coughing and/or fever .  Aspiration pneumonia can be dangerous when occurring in the presence of these other health conditions.  No x-ray imaging onsite today therefore have no way of evaluating your symptoms and given comorbid conditions you would still need to be evaluated in the setting of emergency department for further workup and evaluation.  I am treating you empirically for UTI.  Urine culture is pending.  If any changes in treatment we will call you directly.

## 2022-09-17 NOTE — ED Provider Notes (Signed)
Renaldo Fiddler    CSN: [id number] Arrival date & time: 09/17/22  1709      History   Chief Complaint Chief Complaint  Patient presents with   Urinary Frequency   Dysuria    HPI Kathleen Hatfield is a 67 y.o. female, poorly controlled diabetes, CKD, type 2 diabetes, GERD, oxygen dependent.  HPI Kathleen Hatfield is a 67 y.o. female presents for evaluation of urinary frequency, urgency and dysuria x 5  days. He denies flank pain however is experiencing some lower abdominal pressure and urgency.  Patient reports her blood sugars have been high over the last few days she uses a Dexcom and reports that when she is not feeling well that her blood sugars increase.  Is been taking Tylenol up until today and is not certain if she has had fever.  She reports that the other day that she awakened in the middle the night and felt acid in her mouth and was concerned that she possibly aspirated 4 days ago.  She reports she has had some cough and feeling poorly but is unaware if it is due to UTI or her other chronic conditions.  Reports she was having recurrent UTIs but has not had one within the last few months.  She has been followed by urology and was prescribed nitrofurantoin as a preventative nighttime medication for preventative UTI therapy.  Reports it is almost time for her to have her follow-up visit with urology.  She reports shortness of breath at baseline and at times has felt shortness of breath has worsened over the last couple days.  Initially on arrival patient's oxygen level was 90% on recheck her oxygen level improved to 93% after recheck.  Reports she is on continuous 2 L of oxygen at home but does not routinely bring it out with her unless she absolutely needs it.  She  has not take any medication for the cough and is not actively coughing since being here at urgent care. She also wears a BiPAP machine reports compliance with at nighttime. Past Medical History:  Diagnosis Date   Anemia     Arthritis    Asthma    Chronic diastolic CHF 05/2014   Echocardiogram 05/2019: EF 70, no RWMA, mild LVH, Gr 1 DD, normal RVSF, severe LVH, borderline asc Aorta (39 mm)   CKD (chronic kidney disease)    Depression    Diabetes mellitus without complication (HCC)    Diverticulitis    Dyspnea    with exertion   GERD (gastroesophageal reflux disease)    History of blood transfusion    Hyperlipidemia    cannot tolerate statins   Hypertension    patient states she has never had high blood pressure.    Nuclear stress test    Myoview 05/2019: EF 83, no ischemia or infarction; Low Risk   Peripheral neuropathy    Pneumonia    PONV (postoperative nausea and vomiting)    RLS (restless legs syndrome)    Sleep apnea    uses CPAP   Ulcerative colitis [facility])    dr [address]oreta [address]    Patient Active Problem List   Diagnosis Date Noted   Diabetes mellitus (HCC) 03/31/2022   Myalgia 02/25/2022   Prolonged Q-T interval on ECG 02/25/2022   Aortic atherosclerosis (HCC) 06/04/2021   Preoperative cardiovascular examination 06/04/2021   Hyperglycemia 02/14/2021   Rash 09/27/2020   COPD exacerbation (HCC) 09/21/2020   Acute exacerbation of COPD with asthma (HCC) 09/19/2020  Mixed diabetic hyperlipidemia associated with type 2 diabetes mellitus (HCC) 09/19/2020   Pain of foot 06/20/2020   Medicare annual wellness visit, initial 06/20/2020   Constipation 06/11/2020   Diverticulosis of colon 06/11/2020   Irritable bowel syndrome 06/11/2020   Periumbilical pain 06/11/2020   RLS (restless legs syndrome)    Asthma exacerbation 01/08/2020   Muscle weakness 01/08/2020   Grade I diastolic dysfunction 01/08/2020   Drug-induced myopathy 10/10/2019   Advance care planning 06/14/2019   RLQ abdominal pain 06/14/2019   COPD (chronic obstructive pulmonary disease) (HCC) 06/14/2019   Leukocytosis 06/14/2019   Hypercalcemia 06/14/2019   Gout 06/14/2019   COPD with acute exacerbation (HCC) 05/03/2017   CKD (chronic  kidney disease) stage 3, GFR 30-59 ml/min (HCC)    Hypersomnia with sleep apnea 09/16/2015   Fatigue 06/07/2015   Morbid obesity (HCC) 06/07/2015   Cough 04/18/2015   Chronic diastolic CHF 05/2014   (HFpEF) heart failure with preserved ejection fraction (HCC)    Pneumonia of both lower lobes due to infectious organism 02/09/2013   OSA on CPAP 08/23/2012   Essential hypertension    Depression    GERD without esophagitis    Hyperlipidemia    Ulcerative colitis (HCC)    Patellar tendinitis 05/25/2011   Knee pain 05/25/2011   Myofascial pain 05/25/2011   Cervicalgia 05/25/2011    Past Surgical History:  Procedure Laterality Date   ABDOMINAL HYSTERECTOMY     APPENDECTOMY     BIOPSY  10/10/2021   Procedure: BIOPSY;  Surgeon: Jeani Hawking, MD;  Location: [facility];  Service: Gastroenterology;;   BREAST BIOPSY Left    BREAST SURGERY     left biopsy   CARDIAC CATHETERIZATION     CESAREAN SECTION     CHOLECYSTECTOMY     COLONOSCOPY WITH PROPOFOL N/A 10/10/2021   Procedure: COLONOSCOPY WITH PROPOFOL;  Surgeon: Jeani Hawking, MD;  Location: [facility];  Service: Gastroenterology;  Laterality: N/A;   HERNIA REPAIR     INSERTION OF MESH N/A 11/15/2015   Procedure: INSERTION OF MESH;  Surgeon: Karie Soda, MD;  Location: [facility];  Service: General;  Laterality: N/A;   LAPAROSCOPIC LYSIS OF ADHESIONS N/A 11/15/2015   Procedure: LAPAROSCOPIC LYSIS OF ADHESIONS;  Surgeon: Karie Soda, MD;  Location: [facility];  Service: General;  Laterality: N/A;   RIGHT HEART CATHETERIZATION N/A 02/27/2013   Procedure: RIGHT HEART CATH;  Surgeon: Micheline Chapman, MD;  Location: [facility] CATH LAB;  Service: Cardiovascular;  Laterality: N/A;   RIGHT HEART CATHETERIZATION N/A 12/14/2013   Procedure: RIGHT HEART CATH;  Surgeon: Laurey Morale, MD;  Location: [facility] CATH LAB;  Service: Cardiovascular;  Laterality: N/A;   SIGMOIDECTOMY  2010   diverticular disease   VENTRAL HERNIA REPAIR N/A 11/15/2015   Procedure:  LAPAROSCOPIC VENTRAL WALL HERNIA REPAIR;  Surgeon: Karie Soda, MD;  Location: [facility];  Service: General;  Laterality: N/A;    OB History     Gravida  2   Para  1   Term      Preterm      AB  1   Living         SAB      IAB      Ectopic      Multiple      Live Births               Home Medications    Prior to Admission medications   Medication Sig Start Date End Date Taking?  Authorizing Provider  amoxicillin-clavulanate (AUGMENTIN) 875-125 MG tablet Take 1 tablet by mouth 2 (two) times daily for 7 days. 09/17/22 09/24/22 Yes Bing Neighbors, NP  acetaminophen (TYLENOL) 500 MG tablet Take 500 mg by mouth every 6 (six) hours as needed for moderate pain.    [provider]  albuterol (VENTOLIN HFA) 108 (90 Base) MCG/ACT inhaler INHALE TWO PUFFS BY MOUTH INTO LUNGS every SIX hours AS NEEDED 03/26/22   Joaquim Nam, MD  Alirocumab (PRALUENT) 150 MG/ML SOAJ Inject 1 mL (150 mg total) into the skin every 14 (fourteen) days. 09/01/22   Joaquim Nam, MD  aspirin EC 81 MG tablet Take 1 tablet (81 mg total) by mouth daily. Swallow whole. 06/04/21   Tereso Newcomer T, PA-C  BAYER CONTOUR NEXT TEST test strip 1 each by Other route daily. As directed 07/30/15   [provider]  buPROPion (WELLBUTRIN XL) 300 MG 24 hr tablet TAKE ONE TABLET BY MOUTH EVERY MORNING 02/03/22   Joaquim Nam, MD  Cholecalciferol (VITAMIN D-3) 5000 UNITS TABS Take 5,000 Units by mouth every other day.     [provider]  colchicine 0.6 MG tablet TAKE 1 TABLET (0.6 MG TOTAL) BY MOUTH DAILY AS NEEDED (FOR GOUT) 07/20/22   Joaquim Nam, MD  Continuous Blood Gluc Receiver (DEXCOM G7 RECEIVER) DEVI by Does not apply route.    [provider]  Continuous Blood Gluc Sensor (DEXCOM G7 SENSOR) MISC 1 Device by Does not apply route as directed. Apply sensor every 10 days 03/31/22   Shamleffer, Konrad Dolores, MD  dicyclomine (BENTYL) 20 MG tablet TAKE 1 TABLET UP TO  FOUR TIMES A DAY FOR GI CRAMPING, PAIN, NAUSEA, AND VOMITING AS NEEDED 03/14/21   Joaquim Nam, MD  DULoxetine (CYMBALTA) 60 MG capsule TAKE ONE CAPSULE BY MOUTH EVERYDAY AT BEDTIME 04/02/22   Joaquim Nam, MD  estradiol (ESTRACE) 0.1 MG/GM vaginal cream Place 1 Applicatorful vaginally 3 (three) times a week.    [provider]  FEROSUL 325 (65 Fe) MG tablet TAKE ONE TABLET BY MOUTH every other evening 05/01/22   Joaquim Nam, MD  fluticasone [facility]) 50 MCG/ACT nasal spray Place 2 sprays into both nostrils daily. 02/08/21   Marita Kansas, PA-C  furosemide (LASIX) 20 MG tablet Take 3 tablets (60 mg total) by mouth 2 (two) times daily. 05/01/22   Tereso Newcomer T, PA-C  gabapentin (NEURONTIN) 300 MG capsule TAKE ONE CAPSULE BY MOUTH EVERYDAY AT BEDTIME May take additional capsule daily if needed 08/03/22   Joaquim Nam, MD  insulin aspart (NOVOLOG FLEXPEN) 100 UNIT/ML FlexPen Max daily 45 units 03/31/22   Shamleffer, Konrad Dolores, MD  insulin degludec (TRESIBA FLEXTOUCH) 200 UNIT/ML FlexTouch Pen Inject 98 Units into the skin daily. 07/09/22   Joaquim Nam, MD  Insulin Pen Needle (GLOBAL EASE INJECT PEN NEEDLES) 32G X 4 MM MISC 1 Device by Other route in the morning, at noon, in the evening, and at bedtime. 03/31/22   Shamleffer, Konrad Dolores, MD  ipratropium-albuterol (DUONEB) 0.5-2.5 (3) MG/3ML SOLN Take 3 mLs by nebulization every 6 (six) hours as needed (wheezing). 02/10/21   Coralyn Helling, MD  ketoconazole (NIZORAL) 2 % cream APPLY TO AFFECTED AREA TWICE A DAY 04/29/22   Joaquim Nam, MD  meloxicam (MOBIC) 15 MG tablet Take 1 tablet (15 mg total) by mouth daily. 08/18/22 10/17/22  Felecia Shelling, DPM  metolazone (ZAROXOLYN) 5 MG tablet TAKE 1 TABLET BY MOUTH EVERY  DAY AS NEEDED 12/01/21   Joaquim Nam, MD  MICROLET LANCETS MISC 1 each by Other route. As directed 07/30/15   [provider]  nitrofurantoin (MACRODANTIN) 50 MG capsule Take 50 mg by mouth daily. 10/08/21    [provider]  nystatin (MYCOSTATIN/NYSTOP) powder Apply 1 application topically 3 (three) times daily. 09/26/20   Joaquim Nam, MD  ondansetron (ZOFRAN) 4 MG tablet TAKE ONE TABLET BY MOUTH every EIGHT hours AS NEEDED FOR NAUSEA AND VOMITING 05/22/22   Joaquim Nam, MD  OXYGEN Inhale 2 L into the lungs continuous.    [provider]  pantoprazole (PROTONIX) 40 MG tablet TAKE ONE TABLET BY MOUTH EVERY MORNING 05/25/22   Joaquim Nam, MD  polyethylene glycol [facility] Ethelene Hal) packet Take 17 g by mouth daily as needed for mild constipation.    [provider]  potassium chloride (MICRO-K) 10 MEQ CR capsule Take 5 capsules (50 mEq total) by mouth daily. 12/10/21   Joaquim Nam, MD  rOPINIRole (REQUIP) 4 MG tablet TAKE 1 TABLET BY MOUTH EVERYDAY AT BEDTIME 07/09/22   Joaquim Nam, MD  Semaglutide, 2 MG/DOSE, 8 MG/3ML SOPN Inject 2 mg as directed once a week. 07/29/22   Joaquim Nam, MD  simethicone (GAS-X) 80 MG chewable tablet Chew 1 tablet (80 mg total) by mouth every 6 (six) hours as needed for flatulence. 11/28/21   Joaquim Nam, MD  spironolactone (ALDACTONE) 25 MG tablet Take 0.5 tablets (12.5 mg total) by mouth daily. 05/26/22   Joaquim Nam, MD  traMADol (ULTRAM) 50 MG tablet Take 1 tablet (50 mg total) by mouth 3 (three) times daily as needed. 09/01/22   Joaquim Nam, MD  UNABLE TO FIND CPAP- At bedtime    [provider]    Family History Family History  Problem Relation Age of Onset   Heart disease Mother    Hypertension Mother    Dementia Mother    Heart disease Father    COPD Father    Hypertension Father    Heart disease Brother    Other Brother        knee replacement; hip replacement   Other Brother        cant brathe when laying down   Diabetes Maternal Grandmother    Breast cancer Neg Hx    Colon cancer Neg Hx     Social History Social History   Tobacco Use   Smoking status: Never    Passive  exposure: Past   Smokeless tobacco: Never  Vaping Use   Vaping status: Never Used  Substance Use Topics   Alcohol use: No   Drug use: No     Allergies   Almond oil, Morphine and codeine, Atorvastatin, Ceclor [cefaclor], Sulfa antibiotics, Ciprofloxacin, Levaquin [levofloxacin], Losartan, Peanut-containing drug products, Primidone, Repatha [evolocumab], and Statins   Review of Systems Review of Systems   Physical Exam Triage Vital Signs ED Triage Vitals  Encounter Vitals Group     BP 09/17/22 1835 135/83     Systolic BP Percentile --      Diastolic BP Percentile --      Pulse Rate 09/17/22 1835 90     Resp 09/17/22 1835 15     Temp 09/17/22 1835 98.2 F (36.8 C)     Temp Source 09/17/22 1835 Oral     SpO2 09/17/22 1835 90 %     Weight --      Height --  Head Circumference --      Peak Flow --      Pain Score 09/17/22 1834 3     Pain Loc --      Pain Education --      Exclude from Growth Chart --    No data found.  Updated Vital Signs BP 135/83 (BP Location: Right Arm)   Pulse 90   Temp 98.2 F (36.8 C) (Oral)   Resp 15   SpO2 93%   Visual Acuity Right Eye Distance:   Left Eye Distance:   Bilateral Distance:    Right Eye Near:   Left Eye Near:    Bilateral Near:     Physical Exam Constitutional:      Appearance: She is obese.  HENT:     Head: Normocephalic and atraumatic.     Right Ear: External ear normal.     Left Ear: External ear normal.     Nose: Nose normal.  Eyes:     Extraocular Movements: Extraocular movements intact.     Conjunctiva/sclera: Conjunctivae normal.     Pupils: Pupils are equal, round, and reactive to light.  Cardiovascular:     Rate and Rhythm: Normal rate and regular rhythm.  Pulmonary:     Effort: Pulmonary effort is normal.     Breath sounds: Decreased breath sounds present. No wheezing, rhonchi or rales.  Chest:     Chest wall: No tenderness.  Abdominal:     General: Bowel sounds are normal.     Tenderness:  There is no right CVA tenderness or left CVA tenderness.  Musculoskeletal:     Cervical back: Normal range of motion and neck supple.  Skin:    General: Skin is warm and dry.  Neurological:     Mental Status: She is alert. Mental status is at baseline.      UC Treatments / Results  Labs (all labs ordered are listed, but only abnormal results are displayed) Labs Reviewed  URINE CULTURE - Abnormal; Notable for the following components:      Result Value   Culture   (*)    Value: 30,000 COLONIES/mL GROUP B STREP(S.AGALACTIAE)ISOLATED TESTING AGAINST S. AGALACTIAE NOT ROUTINELY PERFORMED DUE TO PREDICTABILITY OF AMP/PEN/VAN SUSCEPTIBILITY. Performed at [facility], [address], [place], [place] [address]    All other components within normal limits  POCT URINALYSIS DIP (MANUAL ENTRY) - Abnormal; Notable for the following components:   Glucose, UA =250 (*)    Leukocytes, UA Trace (*)    All other components within normal limits    EKG   Radiology No results found.  Procedures Procedures (including critical care time)  Medications Ordered in UC Medications - No data to display  Initial Impression / Assessment and Plan / UC Course  I have reviewed the triage vital signs and the nursing notes.  Pertinent labs & imaging results that were available during my care of the patient were reviewed by me and considered in my medical decision making (see chart for details).    Treating for suspected UTI, Augmentin twice daily for 7 days. Patient encouraged to go to the emergency department if she develops any worsening of shortness of breath given that she thinks she may have aspirated there is no imaging onsite today and given patient's comorbidities aspiration pneumonia can lead to severe illness even death.  Advised that if she develops a cough or shortness of breath that she will follow-up at the emergency department.  Urine  culture is pending.  Will notify patient only if  any treatment warrants changing based on culture results.  Patient was also encouraged to follow-up with PCP regarding elevated blood sugars which may be related to current UTI infection if her sugars do not improve once her infection is treated. Final Clinical Impressions(s) / UC Diagnoses   Final diagnoses:  Dysuria  Acute cystitis without hematuria  Type 2 diabetes mellitus with hyperglycemia, with long-term current use of insulin (HCC)  Cough, unspecified type     Discharge Instructions      If you have aspirated, given your history of heart failure, asthma  I would recommend evaluation in the setting of the emergency department if you develop any shortness of breath or persistent coughing and/or fever .  Aspiration pneumonia can be dangerous when occurring in the presence of these other health conditions.  No x-ray imaging onsite today therefore have no way of evaluating your symptoms and given comorbid conditions you would still need to be evaluated in the setting of emergency department for further workup and evaluation.  I am treating you empirically for UTI.  Urine culture is pending.  If any changes in treatment we will call you directly.     ED Prescriptions     Medication Sig Dispense Auth. Provider   amoxicillin-clavulanate (AUGMENTIN) 875-125 MG tablet Take 1 tablet by mouth 2 (two) times daily for 7 days. 14 tablet Bing Neighbors, NP      PDMP not reviewed this encounter.   Bing Neighbors, NP 09/20/22 1246

## 2022-09-17 NOTE — ED Triage Notes (Signed)
Pt presents with c/o a recurring UTI. States she aspirated the 1st week of July.   Pt states she has lower lt abd and back pain. States she has constant aches and pains.       135/83 89 91%

## 2022-09-19 LAB — URINE CULTURE
Culture: 30000 — AB
Special Requests: NORMAL

## 2022-09-20 ENCOUNTER — Other Ambulatory Visit: Payer: Self-pay | Admitting: Family Medicine

## 2022-09-21 ENCOUNTER — Ambulatory Visit (INDEPENDENT_AMBULATORY_CARE_PROVIDER_SITE_OTHER)
Admission: RE | Admit: 2022-09-21 | Discharge: 2022-09-21 | Disposition: A | Discharge status: Home / Self Care | Payer: Medicare HMO | Source: Ambulatory Visit | Attending: Primary Care | Admitting: Primary Care

## 2022-09-21 ENCOUNTER — Encounter: Payer: Self-pay | Admitting: Primary Care

## 2022-09-21 ENCOUNTER — Ambulatory Visit (INDEPENDENT_AMBULATORY_CARE_PROVIDER_SITE_OTHER): Payer: Medicare HMO | Admitting: Primary Care

## 2022-09-21 VITALS — BP 110/78 | HR 93 | Temp 98.5°F | Ht 65.0 in | Wt 270.0 lb

## 2022-09-21 DIAGNOSIS — R051 Acute cough: Secondary | ICD-10-CM

## 2022-09-21 DIAGNOSIS — I509 Heart failure, unspecified: Secondary | ICD-10-CM | POA: Diagnosis not present

## 2022-09-21 DIAGNOSIS — R059 Cough, unspecified: Secondary | ICD-10-CM | POA: Diagnosis not present

## 2022-09-21 DIAGNOSIS — R918 Other nonspecific abnormal finding of lung field: Secondary | ICD-10-CM | POA: Diagnosis not present

## 2022-09-21 DIAGNOSIS — I517 Cardiomegaly: Secondary | ICD-10-CM | POA: Diagnosis not present

## 2022-09-21 LAB — CBC WITH DIFFERENTIAL/PLATELET
Basophils Absolute: 0.1 10*3/uL (ref 0.0–0.1)
Basophils Relative: 0.5 % (ref 0.0–3.0)
Eosinophils Absolute: 0.3 10*3/uL (ref 0.0–0.7)
Eosinophils Relative: 2.9 % (ref 0.0–5.0)
HCT: 41.7 % (ref 36.0–46.0)
Hemoglobin: 13.6 g/dL (ref 12.0–15.0)
Lymphocytes Relative: 36.6 % (ref 12.0–46.0)
Lymphs Abs: 3.6 10*3/uL (ref 0.7–4.0)
MCHC: 32.6 g/dL (ref 30.0–36.0)
MCV: 93.8 fl (ref 78.0–100.0)
Monocytes Absolute: 0.9 10*3/uL (ref 0.1–1.0)
Monocytes Relative: 9 % (ref 3.0–12.0)
Neutro Abs: 5 10*3/uL (ref 1.4–7.7)
Neutrophils Relative %: 51 % (ref 43.0–77.0)
Platelets: 264 10*3/uL (ref 150.0–400.0)
RBC: 4.44 Mil/uL (ref 3.87–5.11)
RDW: 14.8 % (ref 11.5–15.5)
WBC: 9.8 10*3/uL (ref 4.0–10.5)

## 2022-09-21 LAB — BRAIN NATRIURETIC PEPTIDE: Pro B Natriuretic peptide (BNP): 45 pg/mL (ref 0.0–100.0)

## 2022-09-21 MED ORDER — PREDNISONE 20 MG PO TABS
ORAL_TABLET | ORAL | 0 refills | Status: DC
Start: 2022-09-21 — End: 2022-11-04

## 2022-09-21 NOTE — Assessment & Plan Note (Signed)
Differentials include COPD exacerbation, CHF exacerbation, pneumonia, pulmonary embolism, other respiratory involvement. Oxygenation levels today appear to be at baseline which is reassuring.  Checking labs today including D-dimer, BNP, CBC with differential. Chest x-ray ordered and pending.  Given her exam, will start with prednisone burst. Start prednisone 20 mg tablets. Take 2 tablets by mouth once daily in the morning for 5 days.  Discussed to take metolazone 5 mg today. Continue furosemide 60 mg twice daily as prescribed.  She is stable for outpatient treatment, but we did discuss ER precautions.

## 2022-09-21 NOTE — Progress Notes (Signed)
Subjective:    Patient ID: Kathleen Hatfield, female    DOB: August 10, 1955, 67 y.o.   MRN: [id number]  HPI  Kathleen Hatfield is a very pleasant 66 y.o. female patient of Dr. Para March with a history of type 2 diabetes, CKD, asthma, pneumonia, COPD with exacerbation, ulcerative colitis, CHF, acute cystitis, hypertension who presents today for urgent care follow-up.  Her significant other joins [place] today.  She presented to [facility] urgent care on 09/17/2022 for symptoms of urinary frequency, urgency, dysuria that began 5 days prior.  She was also concerned that she may have aspirated during the night 1 night.  She was treated with Augmentin 875-125 mg twice daily x 7 days for cystitis and potential aspiration pneumonia.  Her urine culture returned with 30,000 colonies of group be S. Agalactiae. She was advised to discontinue the antibiotic medications.  Today she continues to take Augmentin. She is managed on supplemental oxygen for which she uses as needed, but over the last two weeks she's used it more continuously.   About 1 week ago she developed a burning, dry cough. "Felt like my lungs were irritated". Since then her cough has become more persistent. She has noticed body aches. Her shortness of breath has progressed with minimal activity (dressing, toileting).   She's been taking Tylenol with improvement in body ache. She has been weighing herself daily at home which has fluctuated 8 pounds within 1 week. She is compliant to her Lasix 60 mg BID. She hasn't taken her metolazone in about 1 week.   Her legs do not feel more edematous than usual.  She has noticed some abdominal distention, but not as bad as it has been previously.  Wt Readings from Last 3 Encounters:  09/21/22 270 lb (122.5 kg)  07/14/22 258 lb (117 kg)  07/09/22 272 lb (123.4 kg)      Review of Systems  Constitutional:  Positive for fatigue. Negative for fever.  HENT:  Positive for sore throat. Negative for congestion.    Respiratory:  Positive for cough and shortness of breath.   Cardiovascular:  Negative for leg swelling.  Musculoskeletal:  Positive for myalgias.         Past Medical History:  Diagnosis Date   Anemia    Arthritis    Asthma    Chronic diastolic CHF 05/2014   Echocardiogram 05/2019: EF 70, no RWMA, mild LVH, Gr 1 DD, normal RVSF, severe LVH, borderline asc Aorta (39 mm)   CKD (chronic kidney disease)    Depression    Diabetes mellitus without complication (HCC)    Diverticulitis    Dyspnea    with exertion   GERD (gastroesophageal reflux disease)    History of blood transfusion    Hyperlipidemia    cannot tolerate statins   Hypertension    patient states she has never had high blood pressure.    Nuclear stress test    Myoview 05/2019: EF 83, no ischemia or infarction; Low Risk   Peripheral neuropathy    Pneumonia    PONV (postoperative nausea and vomiting)    RLS (restless legs syndrome)    Sleep apnea    uses CPAP   Ulcerative colitis (HCC)    dr Loreta Ave    Social History   Socioeconomic History   Marital status: Divorced    Spouse name: Not on file   Number of children: Not on file   Years of education: Not on file   Highest education level: Not on  file  Occupational History   Occupation: unemployed  Tobacco Use   Smoking status: Never    Passive exposure: Past   Smokeless tobacco: Never  Vaping Use   Vaping status: Never Used  Substance and Sexual Activity   Alcohol use: No   Drug use: No   Sexual activity: Not on file  Other Topics Concern   Not on file  Social History Narrative   Live with spouse   Right handed   Drinks caffiene prn   One floor home   retired   International aid/development worker of Corporate investment banker Strain: Low Risk  (07/14/2022)   Overall Financial Resource Strain (CARDIA)    Difficulty of Paying Living Expenses: Not hard at all  Food Insecurity: No Food Insecurity (07/14/2022)   Hunger Vital Sign    Worried About Running Out of  Food in the Last Year: Never true    Ran Out of Food in the Last Year: Never true  Transportation Needs: No Transportation Needs (07/14/2022)   PRAPARE - Administrator, Civil Service (Medical): No    Lack of Transportation (Non-Medical): No  Physical Activity: Insufficiently Active (07/14/2022)   Exercise Vital Sign    Days of Exercise per Week: 7 days    Minutes of Exercise per Session: 10 min  Stress: No Stress Concern Present (07/14/2022)   [employer] of Occupational Health - Occupational Stress Questionnaire    Feeling of Stress : Not at all  Social Connections: Socially Isolated (07/14/2022)   Social Connection and Isolation Panel [NHANES]    Frequency of Communication with Friends and Family: Never    Frequency of Social Gatherings with Friends and Family: Never    Attends Religious Services: Never    Database administrator or Organizations: No    Attends Banker Meetings: Never    Marital Status: Divorced  Catering manager Violence: Not At Risk (07/14/2022)   Humiliation, Afraid, Rape, and Kick questionnaire    Fear of Current or Ex-Partner: No    Emotionally Abused: No    Physically Abused: No    Sexually Abused: No    Past Surgical History:  Procedure Laterality Date   ABDOMINAL HYSTERECTOMY     APPENDECTOMY     BIOPSY  10/10/2021   Procedure: BIOPSY;  Surgeon: Jeani Hawking, MD;  Location: [facility];  Service: Gastroenterology;;   BREAST BIOPSY Left    BREAST SURGERY     left biopsy   CARDIAC CATHETERIZATION     CESAREAN SECTION     CHOLECYSTECTOMY     COLONOSCOPY WITH PROPOFOL N/A 10/10/2021   Procedure: COLONOSCOPY WITH PROPOFOL;  Surgeon: Jeani Hawking, MD;  Location: [facility];  Service: Gastroenterology;  Laterality: N/A;   HERNIA REPAIR     INSERTION OF MESH N/A 11/15/2015   Procedure: INSERTION OF MESH;  Surgeon: Karie Soda, MD;  Location: [facility];  Service: General;  Laterality: N/A;   LAPAROSCOPIC LYSIS OF ADHESIONS N/A  11/15/2015   Procedure: LAPAROSCOPIC LYSIS OF ADHESIONS;  Surgeon: Karie Soda, MD;  Location: [facility];  Service: General;  Laterality: N/A;   RIGHT HEART CATHETERIZATION N/A 02/27/2013   Procedure: RIGHT HEART CATH;  Surgeon: Micheline Chapman, MD;  Location: [facility] CATH LAB;  Service: Cardiovascular;  Laterality: N/A;   RIGHT HEART CATHETERIZATION N/A 12/14/2013   Procedure: RIGHT HEART CATH;  Surgeon: Laurey Morale, MD;  Location: [facility] CATH LAB;  Service: Cardiovascular;  Laterality: N/A;   SIGMOIDECTOMY  2010  diverticular disease   VENTRAL HERNIA REPAIR N/A 11/15/2015   Procedure: LAPAROSCOPIC VENTRAL WALL HERNIA REPAIR;  Surgeon: Karie Soda, MD;  Location: [facility];  Service: General;  Laterality: N/A;    Family History  Problem Relation Age of Onset   Heart disease Mother    Hypertension Mother    Dementia Mother    Heart disease Father    COPD Father    Hypertension Father    Heart disease Brother    Other Brother        knee replacement; hip replacement   Other Brother        cant brathe when laying down   Diabetes Maternal Grandmother    Breast cancer Neg Hx    Colon cancer Neg Hx     Allergies  Allergen Reactions   Almond Oil Anaphylaxis, Shortness Of Breath and Swelling   Morphine And Codeine Shortness Of Breath and Other (See Comments)    Pt. States while in the hospital it affected her breathing, O2 dropped to the 70's   Atorvastatin Other (See Comments)    Leg weakness   Ceclor [Cefaclor] Nausea And Vomiting   Sulfa Antibiotics Nausea And Vomiting   Ciprofloxacin Other (See Comments)    Makes joints and muscles ache   Levaquin [Levofloxacin] Other (See Comments)    Body aches   Losartan Other (See Comments)    Weakness    Peanut-Containing Drug Products     Pt states she thinks it is causing gout flare-ups   Primidone     Intolerant, SOB with use.    Repatha [Evolocumab]     Aches   Statins Other (See Comments)    Leg and body weakness    Current  Outpatient Medications on File Prior to Visit  Medication Sig Dispense Refill   acetaminophen (TYLENOL) 500 MG tablet Take 500 mg by mouth every 6 (six) hours as needed for moderate pain.     albuterol (VENTOLIN HFA) 108 (90 Base) MCG/ACT inhaler INHALE TWO PUFFS BY MOUTH INTO LUNGS every SIX hours AS NEEDED 17 g 4   Alirocumab (PRALUENT) 150 MG/ML SOAJ Inject 1 mL (150 mg total) into the skin every 14 (fourteen) days. 6 mL 1   amoxicillin-clavulanate (AUGMENTIN) 875-125 MG tablet Take 1 tablet by mouth 2 (two) times daily for 7 days. 14 tablet 0   aspirin EC 81 MG tablet Take 1 tablet (81 mg total) by mouth daily. Swallow whole. 90 tablet 3   BAYER CONTOUR NEXT TEST test strip 1 each by Other route daily. As directed     buPROPion (WELLBUTRIN XL) 300 MG 24 hr tablet TAKE ONE TABLET BY MOUTH EVERY MORNING 90 tablet 2   Cholecalciferol (VITAMIN D-3) 5000 UNITS TABS Take 5,000 Units by mouth every other day.      colchicine 0.6 MG tablet TAKE 1 TABLET (0.6 MG TOTAL) BY MOUTH DAILY AS NEEDED (FOR GOUT) 30 tablet 1   Continuous Blood Gluc Receiver (DEXCOM G7 RECEIVER) DEVI by Does not apply route.     Continuous Blood Gluc Sensor (DEXCOM G7 SENSOR) MISC 1 Device by Does not apply route as directed. Apply sensor every 10 days 9 each 3   dicyclomine (BENTYL) 20 MG tablet TAKE 1 TABLET UP TO FOUR TIMES A DAY FOR GI CRAMPING, PAIN, NAUSEA, AND VOMITING AS NEEDED 60 tablet 1   DULoxetine (CYMBALTA) 60 MG capsule TAKE ONE CAPSULE BY MOUTH EVERYDAY AT BEDTIME 90 capsule 3   estradiol (ESTRACE) 0.1 MG/GM vaginal  cream Place 1 Applicatorful vaginally 3 (three) times a week.     FEROSUL 325 (65 Fe) MG tablet TAKE ONE TABLET BY MOUTH every other evening 30 tablet 11   fluticasone (FLONASE) 50 MCG/ACT nasal spray Place 2 sprays into both nostrils daily. 9.9 mL 0   furosemide (LASIX) 20 MG tablet Take 3 tablets (60 mg total) by mouth 2 (two) times daily. 540 tablet 3   gabapentin (NEURONTIN) 300 MG capsule TAKE  ONE CAPSULE BY MOUTH EVERYDAY AT BEDTIME May take additional capsule daily if needed 90 capsule 1   insulin aspart (NOVOLOG FLEXPEN) 100 UNIT/ML FlexPen Max daily 45 units 45 mL 3   insulin degludec (TRESIBA FLEXTOUCH) 200 UNIT/ML FlexTouch Pen Inject 98 Units into the skin daily.     Insulin Pen Needle (GLOBAL EASE INJECT PEN NEEDLES) 32G X 4 MM MISC 1 Device by Other route in the morning, at noon, in the evening, and at bedtime. 400 each 3   ipratropium-albuterol (DUONEB) 0.5-2.5 (3) MG/3ML SOLN Take 3 mLs by nebulization every 6 (six) hours as needed (wheezing). 360 mL 6   ketoconazole (NIZORAL) 2 % cream APPLY TO AFFECTED AREA TWICE A DAY 30 g 1   meloxicam (MOBIC) 15 MG tablet Take 1 tablet (15 mg total) by mouth daily. 30 tablet 1   metolazone (ZAROXOLYN) 5 MG tablet TAKE 1 TABLET BY MOUTH EVERY DAY AS NEEDED 30 tablet 0   MICROLET LANCETS MISC 1 each by Other route. As directed     nitrofurantoin (MACRODANTIN) 50 MG capsule Take 50 mg by mouth daily.     nystatin (MYCOSTATIN/NYSTOP) powder Apply 1 application topically 3 (three) times daily. 60 g 1   ondansetron (ZOFRAN) 4 MG tablet TAKE ONE TABLET BY MOUTH every EIGHT hours AS NEEDED FOR NAUSEA AND VOMITING 20 tablet 3   OXYGEN Inhale 2 L into the lungs continuous.     pantoprazole (PROTONIX) 40 MG tablet TAKE ONE TABLET BY MOUTH EVERY MORNING 90 tablet 1   polyethylene glycol (MIRALAX / GLYCOLAX) packet Take 17 g by mouth daily as needed for mild constipation.     potassium chloride (MICRO-K) 10 MEQ CR capsule Take 5 capsules (50 mEq total) by mouth daily. 150 capsule 1   rOPINIRole (REQUIP) 4 MG tablet TAKE 1 TABLET BY MOUTH EVERYDAY AT BEDTIME     Semaglutide, 2 MG/DOSE, 8 MG/3ML SOPN Inject 2 mg as directed once a week. 3 mL 5   simethicone (GAS-X) 80 MG chewable tablet Chew 1 tablet (80 mg total) by mouth every 6 (six) hours as needed for flatulence. 60 tablet 0   spironolactone (ALDACTONE) 25 MG tablet Take 0.5 tablets (12.5 mg  total) by mouth daily. 45 tablet 1   traMADol (ULTRAM) 50 MG tablet Take 1 tablet (50 mg total) by mouth 3 (three) times daily as needed. 30 tablet 1   UNABLE TO FIND CPAP- At bedtime     No current facility-administered medications on file prior to visit.    BP 110/78   Pulse 93   Temp 98.5 F (36.9 C) (Temporal)   Ht 5\' 5"  (1.651 m)   Wt 270 lb (122.5 kg)   SpO2 95%   BMI 44.93 kg/m  Objective:   Physical Exam Constitutional:      General: She is not in acute distress.    Appearance: She is ill-appearing.  Cardiovascular:     Rate and Rhythm: Normal rate and regular rhythm.  Pulmonary:     Breath sounds:  Examination of the right-upper field reveals wheezing and rhonchi. Examination of the left-upper field reveals wheezing and rhonchi. Examination of the right-lower field reveals wheezing and rhonchi. Examination of the left-lower field reveals wheezing and rhonchi. Wheezing and rhonchi present.  Skin:    General: Skin is warm and dry.  Neurological:     Mental Status: She is oriented to person, place, and time.           Assessment & Plan:  Acute cough Assessment & Plan: Differentials include COPD exacerbation, CHF exacerbation, pneumonia, pulmonary embolism, other respiratory involvement. Oxygenation levels today appear to be at baseline which is reassuring.  Checking labs today including D-dimer, BNP, CBC with differential. Chest x-ray ordered and pending.  Given her exam, will start with prednisone burst. Start prednisone 20 mg tablets. Take 2 tablets by mouth once daily in the morning for 5 days.  Discussed to take metolazone 5 mg today. Continue furosemide 60 mg twice daily as prescribed.  She is stable for outpatient treatment, but we did discuss ER precautions.  Orders: -     D-dimer, quantitative -     Brain natriuretic peptide -     CBC with Differential/Platelet -     DG Chest 2 View -     predniSONE; Take 2 tablets by mouth once daily for 5 days.   Dispense: 10 tablet; Refill: 0        Doreene Nest, NP

## 2022-09-21 NOTE — Patient Instructions (Signed)
Complete xray(s) and labs prior to leaving today. I will notify you of your results once received.  Take your metolazone tablet today.  Start prednisone 20 mg tablets. Take 2 tablets by mouth once daily in the morning for 5 days.  It was a pleasure meeting you!

## 2022-09-22 ENCOUNTER — Telehealth: Payer: Self-pay | Admitting: Family Medicine

## 2022-09-22 ENCOUNTER — Other Ambulatory Visit: Payer: Self-pay | Admitting: Primary Care

## 2022-09-22 ENCOUNTER — Telehealth: Payer: Self-pay

## 2022-09-22 ENCOUNTER — Ambulatory Visit
Admission: RE | Admit: 2022-09-22 | Discharge: 2022-09-22 | Disposition: A | Discharge status: Home / Self Care | Payer: Medicare HMO | Source: Ambulatory Visit | Attending: Primary Care | Admitting: Primary Care

## 2022-09-22 DIAGNOSIS — R918 Other nonspecific abnormal finding of lung field: Secondary | ICD-10-CM | POA: Diagnosis not present

## 2022-09-22 DIAGNOSIS — R7989 Other specified abnormal findings of blood chemistry: Secondary | ICD-10-CM | POA: Diagnosis not present

## 2022-09-22 DIAGNOSIS — R0609 Other forms of dyspnea: Secondary | ICD-10-CM

## 2022-09-22 DIAGNOSIS — I700 Atherosclerosis of aorta: Secondary | ICD-10-CM | POA: Diagnosis not present

## 2022-09-22 LAB — D-DIMER, QUANTITATIVE: D-Dimer, Quant: 0.55 mcg/mL FEU — ABNORMAL HIGH (ref <0.50)

## 2022-09-22 MED ORDER — IOHEXOL 350 MG/ML SOLN
100.0000 mL | Freq: Once | INTRAVENOUS | Status: AC | PRN
  Administered 2022-09-22: 85 mL via INTRAVENOUS

## 2022-09-22 NOTE — Telephone Encounter (Signed)
Patient called in to follow up on CT results.

## 2022-09-22 NOTE — Telephone Encounter (Signed)
See result note.  

## 2022-09-22 NOTE — Telephone Encounter (Signed)
See orders, already addressed.

## 2022-09-22 NOTE — Telephone Encounter (Signed)
CT was ordered by Mayra Reel; does not look like results have been resulted yet.

## 2022-09-24 ENCOUNTER — Ambulatory Visit: Payer: Medicare HMO | Admitting: Family Medicine

## 2022-10-01 LAB — HM DIABETES EYE EXAM

## 2022-10-06 ENCOUNTER — Encounter: Payer: Self-pay | Admitting: Internal Medicine

## 2022-10-06 ENCOUNTER — Ambulatory Visit: Payer: Medicare HMO | Admitting: Internal Medicine

## 2022-10-06 VITALS — BP 126/78 | HR 100 | Ht 65.0 in | Wt 257.0 lb

## 2022-10-06 DIAGNOSIS — Z794 Long term (current) use of insulin: Secondary | ICD-10-CM | POA: Diagnosis not present

## 2022-10-06 DIAGNOSIS — E1165 Type 2 diabetes mellitus with hyperglycemia: Secondary | ICD-10-CM | POA: Diagnosis not present

## 2022-10-06 DIAGNOSIS — Z7985 Long-term (current) use of injectable non-insulin antidiabetic drugs: Secondary | ICD-10-CM

## 2022-10-06 LAB — POCT GLYCOSYLATED HEMOGLOBIN (HGB A1C): Hemoglobin A1C: 12.9 % — AB (ref 4.0–5.6)

## 2022-10-06 NOTE — Progress Notes (Signed)
Name: Kathleen Hatfield  Age/ Sex: 67 y.o., female   MRN/ DOB: [id number], [birth year]     PCP: Joaquim Nam, MD   Reason for Endocrinology Evaluation: Type 2 Diabetes Mellitus  Initial Endocrine Consultative Visit: 05/14/2021    PATIENT IDENTIFIER: Kathleen Hatfield is a 67 y.o. female with a past medical history of DM, CHF, COPS. OSA on CPAP, GERD. The patient has followed with Endocrinology clinic since 05/14/2021 for consultative assistance with management of her diabetes.  DIABETIC HISTORY:  Kathleen Hatfield was diagnosed with DM 2017, and started insulin therapyin 2000. Her hemoglobin A1c has ranged from 7.4% in 2017, peaking at 11.5% in 2022.   She was seen by Dr. Everardo All once 05/2021   SUBJECTIVE:   During the last visit (03/31/2022): A1c 11.8%     Today (10/06/2022): Kathleen Hatfield is here for a follow up on diabetes management..  She checks her  glucose through dexcom G7.  She has no hypoglycemia.  She has chronic UTIs- sees urology on chronic Abx    Takes Tresiba 2-3x weeks  She has been shaking and jerking as  well as restless  Denies vomiting or diarrhea    HOME DIABETES REGIMEN:  Ozempic 2 mg weekly  Tresiba 120 units - she takes it 2-3 a week  Novolog - when she remembers     Statin: on repatha ACE-I/ARB: no   CONTINUOUS GLUCOSE MONITORING RECORD INTERPRETATION    Dates of Recording: 7/17-7/30/2024  Sensor description:dexcom  Results statistics:   CGM use % of time 64  Average and SD 373/57  Time in range   <1     %  % Time Above 180 8  % Time above 250 91  % Time Below target 0   Glycemic patterns summary: Hyperglycemia noted during the day and the night  Hyperglycemic episodes all day and night but worse postprandial  Hypoglycemic episodes occurred N/A  Overnight periods: High   DIABETIC COMPLICATIONS: Microvascular complications:  Neuropathy,  Denies:  Last Eye Exam: Completed 12/2019  Macrovascular complications:  CAD Denies:  CVA,  PVD   HISTORY:  Past Medical History:  Past Medical History:  Diagnosis Date   Anemia    Arthritis    Asthma    Chronic diastolic CHF 05/2014   Echocardiogram 05/2019: EF 70, no RWMA, mild LVH, Gr 1 DD, normal RVSF, severe LVH, borderline asc Aorta (39 mm)   CKD (chronic kidney disease)    Depression    Diabetes mellitus without complication (HCC)    Diverticulitis    Dyspnea    with exertion   GERD (gastroesophageal reflux disease)    History of blood transfusion    Hyperlipidemia    cannot tolerate statins   Hypertension    patient states she has never had high blood pressure.    Nuclear stress test    Myoview 05/2019: EF 83, no ischemia or infarction; Low Risk   Peripheral neuropathy    Pneumonia    PONV (postoperative nausea and vomiting)    RLS (restless legs syndrome)    Sleep apnea    uses CPAP   Ulcerative colitis (HCC)    dr Loreta Ave   Past Surgical History:  Past Surgical History:  Procedure Laterality Date   ABDOMINAL HYSTERECTOMY     APPENDECTOMY     BIOPSY  10/10/2021   Procedure: BIOPSY;  Surgeon: Jeani Hawking, MD;  Location: [facility];  Service: Gastroenterology;;   BREAST BIOPSY Left    BREAST SURGERY  left biopsy   CARDIAC CATHETERIZATION     CESAREAN SECTION     CHOLECYSTECTOMY     COLONOSCOPY WITH PROPOFOL N/A 10/10/2021   Procedure: COLONOSCOPY WITH PROPOFOL;  Surgeon: Jeani Hawking, MD;  Location: [facility];  Service: Gastroenterology;  Laterality: N/A;   HERNIA REPAIR     INSERTION OF MESH N/A 11/15/2015   Procedure: INSERTION OF MESH;  Surgeon: Karie Soda, MD;  Location: [facility];  Service: General;  Laterality: N/A;   LAPAROSCOPIC LYSIS OF ADHESIONS N/A 11/15/2015   Procedure: LAPAROSCOPIC LYSIS OF ADHESIONS;  Surgeon: Karie Soda, MD;  Location: [facility];  Service: General;  Laterality: N/A;   RIGHT HEART CATHETERIZATION N/A 02/27/2013   Procedure: RIGHT HEART CATH;  Surgeon: Micheline Chapman, MD;  [facility];  Service:  Cardiovascular;  Laterality: N/A;   RIGHT HEART CATHETERIZATION N/A 12/14/2013   Procedure: RIGHT HEART CATH;  Surgeon: Laurey Morale, MD;  Location: [facility] CATH LAB;  Service: Cardiovascular;  Laterality: N/A;   SIGMOIDECTOMY  2010   diverticular disease   VENTRAL HERNIA REPAIR N/A 11/15/2015   Procedure: LAPAROSCOPIC VENTRAL WALL HERNIA REPAIR;  Surgeon: Karie Soda, MD;  Location: [facility];  Service: General;  Laterality: N/A;   Social History:  reports that she has never smoked. She has been exposed to tobacco smoke. She has never used smokeless tobacco. She reports that she does not drink alcohol and does not use drugs. Family History:  Family History  Problem Relation Age of Onset   Heart disease Mother    Hypertension Mother    Dementia Mother    Heart disease Father    COPD Father    Hypertension Father    Heart disease Brother    Other Brother        knee replacement; hip replacement   Other Brother        cant brathe when laying down   Diabetes Maternal Grandmother    Breast cancer Neg Hx    Colon cancer Neg Hx      HOME MEDICATIONS: Allergies as of 10/06/2022       Reactions   Almond Oil Anaphylaxis, Shortness Of Breath, Swelling   Morphine And Codeine Shortness Of Breath, Other (See Comments)   Pt. States while in the hospital it affected her breathing, O2 dropped to the 70's   Atorvastatin Other (See Comments)   Leg weakness   Ceclor [cefaclor] Nausea And Vomiting   Sulfa Antibiotics Nausea And Vomiting   Ciprofloxacin Other (See Comments)   Makes joints and muscles ache   Levaquin [levofloxacin] Other (See Comments)   Body aches   Losartan Other (See Comments)   Weakness   Peanut-containing Drug Products    Pt states she thinks it is causing gout flare-ups   Primidone    Intolerant, SOB with use.    Repatha [evolocumab]    Aches   Statins Other (See Comments)   Leg and body weakness        Medication List        Accurate as of October 06, 2022   2:53 PM. If you have any questions, ask your nurse or doctor.          acetaminophen 500 MG tablet Commonly known as: TYLENOL Take 500 mg by mouth every 6 (six) hours as needed for moderate pain.   albuterol 108 (90 Base) MCG/ACT inhaler Commonly known as: VENTOLIN HFA INHALE TWO PUFFS BY MOUTH INTO LUNGS every SIX hours AS NEEDED  aspirin EC 81 MG tablet Take 1 tablet (81 mg total) by mouth daily. Swallow whole.   Bayer Contour Next Test test strip Generic drug: glucose blood 1 each by Other route daily. As directed   buPROPion 300 MG 24 hr tablet Commonly known as: WELLBUTRIN XL TAKE ONE TABLET BY MOUTH EVERY MORNING   colchicine 0.6 MG tablet TAKE 1 TABLET (0.6 MG TOTAL) BY MOUTH DAILY AS NEEDED (FOR GOUT)   Dexcom G7 Receiver Devi by Does not apply route.   Dexcom G7 Sensor Misc 1 Device by Does not apply route as directed. Apply sensor every 10 days   dicyclomine 20 MG tablet Commonly known as: BENTYL TAKE 1 TABLET UP TO FOUR TIMES A DAY FOR GI CRAMPING, PAIN, NAUSEA, AND VOMITING AS NEEDED   DULoxetine 60 MG capsule Commonly known as: CYMBALTA TAKE ONE CAPSULE BY MOUTH EVERYDAY AT BEDTIME   estradiol 0.1 MG/GM vaginal cream Commonly known as: ESTRACE Place 1 Applicatorful vaginally 3 (three) times a week.   FeroSul 325 (65 Fe) MG tablet Generic drug: ferrous sulfate TAKE ONE TABLET BY MOUTH every other evening   fluticasone 50 MCG/ACT nasal spray Commonly known as: FLONASE Place 2 sprays into both nostrils daily.   furosemide 20 MG tablet Commonly known as: LASIX Take 3 tablets (60 mg total) by mouth 2 (two) times daily.   gabapentin 300 MG capsule Commonly known as: NEURONTIN TAKE ONE CAPSULE BY MOUTH EVERYDAY AT BEDTIME May take additional capsule daily if needed   Global Ease Inject Pen Needles 32G X 4 MM Misc Generic drug: Insulin Pen Needle 1 Device by Other route in the morning, at noon, in the evening, and at bedtime.    ipratropium-albuterol 0.5-2.5 (3) MG/3ML Soln Commonly known as: DUONEB Take 3 mLs by nebulization every 6 (six) hours as needed (wheezing).   ketoconazole 2 % cream Commonly known as: NIZORAL APPLY TO AFFECTED AREA TWICE A DAY   meloxicam 15 MG tablet Commonly known as: MOBIC Take 1 tablet (15 mg total) by mouth daily.   metolazone 5 MG tablet Commonly known as: ZAROXOLYN TAKE 1 TABLET BY MOUTH EVERY DAY AS NEEDED   Microlet Lancets Misc 1 each by Other route. As directed   nitrofurantoin 50 MG capsule Commonly known as: MACRODANTIN Take 50 mg by mouth daily.   NovoLOG FlexPen 100 UNIT/ML FlexPen Generic drug: insulin aspart Max daily 45 units   nystatin powder Commonly known as: MYCOSTATIN/NYSTOP Apply 1 application topically 3 (three) times daily.   ondansetron 4 MG tablet Commonly known as: ZOFRAN TAKE ONE TABLET BY MOUTH every EIGHT hours AS NEEDED FOR NAUSEA AND VOMITING   OXYGEN Inhale 2 L into the lungs continuous.   pantoprazole 40 MG tablet Commonly known as: PROTONIX TAKE ONE TABLET BY MOUTH EVERY MORNING   polyethylene glycol 17 g packet Commonly known as: MIRALAX / GLYCOLAX Take 17 g by mouth daily as needed for mild constipation.   potassium chloride 10 MEQ CR capsule Commonly known as: MICRO-K TAKE 5 CAPSULES BY MOUTH AT NOON   Praluent 150 MG/ML Soaj Generic drug: Alirocumab Inject 1 mL (150 mg total) into the skin every 14 (fourteen) days.   predniSONE 20 MG tablet Commonly known as: DELTASONE Take 2 tablets by mouth once daily for 5 days.   rOPINIRole 4 MG tablet Commonly known as: REQUIP TAKE 1 TABLET BY MOUTH EVERYDAY AT BEDTIME   Semaglutide (2 MG/DOSE) 8 MG/3ML Sopn Inject 2 mg as directed once a week.  simethicone 80 MG chewable tablet Commonly known as: Gas-X Chew 1 tablet (80 mg total) by mouth every 6 (six) hours as needed for flatulence.   spironolactone 25 MG tablet Commonly known as: ALDACTONE Take 0.5 tablets  (12.5 mg total) by mouth daily.   traMADol 50 MG tablet Commonly known as: ULTRAM Take 1 tablet (50 mg total) by mouth 3 (three) times daily as needed.   Kathleen Hatfield FlexTouch 200 UNIT/ML FlexTouch Pen Generic drug: insulin degludec Inject 98 Units into the skin daily. What changed: how much to take   UNABLE TO FIND CPAP- At bedtime   Vitamin D-3 125 MCG (5000 UT) Tabs Take 5,000 Units by mouth every other day.         OBJECTIVE:   Vital Signs: BP 126/78 (BP Location: Left Arm, Patient Position: Sitting, Cuff Size: Large)   Pulse 100   Ht 5\' 5"  (1.651 m)   Wt 257 lb (116.6 kg)   SpO2 97%   BMI 42.77 kg/m   Wt Readings from Last 3 Encounters:  10/06/22 257 lb (116.6 kg)  09/21/22 270 lb (122.5 kg)  07/14/22 258 lb (117 kg)     Exam: General: Pt appears well and is in NAD In a wheel chair   Lungs: Clear with good BS bilat   Heart: RRR   Extremities: No pretibial edema.   Neuro: MS is good with appropriate affect, pt is alert and Ox3     DATA REVIEWED:  Lab Results  Component Value Date   HGBA1C 12.9 (A) 10/06/2022   HGBA1C 11.1 (H) 06/26/2022   HGBA1C 11.8 (A) 03/31/2022   Lab Results  Component Value Date   LDLCALC 105 (H) 11/25/2021   CREATININE 1.33 (H) 08/14/2022   No results found for: "MICRALBCREAT"   Lab Results  Component Value Date   CHOL 202 (H) 11/25/2021   HDL 47 11/25/2021   LDLCALC 105 (H) 11/25/2021   LDLDIRECT 96 02/25/2022   TRIG 297 (H) 11/25/2021   CHOLHDL 4.3 11/25/2021         ASSESSMENT / PLAN / RECOMMENDATIONS:   1) Type 2 Diabetes Mellitus, Poorly controlled, With CKD III and macrovascular complications - Most recent A1c of 12.9 %. Goal A1c < 7.0 %.    -Patient continues with poorly controlled diabetes, in reviewing her CGM the patient averages 375 Mg/DL.  There is no evidence of any insulin intake on the download.  The patient takes [place] 120 units on average 2-3 times a week, she takes NovoLog when she remembers -  Not a candidate for SGLT-2i due to chronic UTI's  - Limited glycemic agents due to CKD -I am not sure how else I can help Kathleen Hatfield, I did advise the patient to consider assisted living, as she is unable to care for herself, she does have a boyfriend, but I did advise her to have a conversation with her family, and if she is unable to help herself or get help she may consider placement -I did explain to the patient that my goal is to help her feel better by improving her glycemic control, but if she is unable to take her medications as prescribed, I am not sure how else I can help her   MEDICATIONS: Continue Ozempic 2 mg weekly Take tresiba 120 units daily  Novolog per correction scale (BG-130/20) TIDQAC  EDUCATION / INSTRUCTIONS: BG monitoring instructions: Patient is instructed to check her blood sugars 3 times a day, before each meal . Call [facility] Endocrinology  clinic if: BG persistently < 70  I reviewed the Rule of 15 for the treatment of hypoglycemia in detail with the patient. Literature supplied.    2) Diabetic complications:  Eye: Does not have known diabetic retinopathy.  Neuro/ Feet: Does not have known diabetic peripheral neuropathy .  Renal: Patient does  have known baseline CKD. She   is not on an ACEI/ARB at present. She had endorses weakness with losartan       F/U in 6 months     Signed electronically by: Lyndle Herrlich, MD  [facility] Endocrinology  [facility] [address] [place], [place] [address] Phone: [phone] FAX: [phone]   CC: Joaquim Nam, MD [address] [place] [place] [facility] [place] [address] Phone: [phone]  Fax: [phone]  Return to Endocrinology clinic as below: Future Appointments  Date Time Provider Department Center  11/04/2022  2:20 PM Tonny Bollman, MD [facility]  11/26/2022 11:15 AM Tat, Octaviano Batty, DO [facility] None  07/15/2023 11:15 AM [facility] ANNUAL  WELLNESS VISIT 1 [facility]

## 2022-10-06 NOTE — Patient Instructions (Addendum)
Ozempic 2 mg weekly  Take Tresiba 120  units DAILY  Novolog correctional insulin:Use the scale below to help guide you before each meal   Blood sugar before meal Number of units to inject  Less than 150 0 unit  151 -  170 1 units  171 -  190 2 units  191 -  210 3 units  211 -  230 4 units  231 -  250 5 units  251 -  270 6 units  271 -  290 7 units  291 -  310 8 units   HOW TO TREAT LOW BLOOD SUGARS (Blood sugar LESS THAN 70 MG/DL) Please follow the RULE OF 15 for the treatment of hypoglycemia treatment (when your (blood sugars are less than 70 mg/dL)   STEP 1: Take 15 grams of carbohydrates when your blood sugar is low, which includes:  3-4 GLUCOSE TABS  OR 3-4 OZ OF JUICE OR REGULAR SODA OR ONE TUBE OF GLUCOSE GEL    STEP 2: RECHECK blood sugar in 15 MINUTES STEP 3: If your blood sugar is still low at the 15 minute recheck --> then, go back to STEP 1 and treat AGAIN with another 15 grams of carbohydrates.

## 2022-10-07 ENCOUNTER — Encounter: Payer: Self-pay | Admitting: Internal Medicine

## 2022-10-08 DIAGNOSIS — K51318 Ulcerative (chronic) rectosigmoiditis with other complication: Secondary | ICD-10-CM | POA: Diagnosis not present

## 2022-10-08 DIAGNOSIS — K5730 Diverticulosis of large intestine without perforation or abscess without bleeding: Secondary | ICD-10-CM | POA: Diagnosis not present

## 2022-10-08 DIAGNOSIS — K219 Gastro-esophageal reflux disease without esophagitis: Secondary | ICD-10-CM | POA: Diagnosis not present

## 2022-10-13 ENCOUNTER — Other Ambulatory Visit: Payer: Self-pay | Admitting: Podiatry

## 2022-10-20 DIAGNOSIS — J449 Chronic obstructive pulmonary disease, unspecified: Secondary | ICD-10-CM | POA: Diagnosis not present

## 2022-10-29 ENCOUNTER — Other Ambulatory Visit: Payer: Self-pay | Admitting: Podiatry

## 2022-10-29 DIAGNOSIS — M79671 Pain in right foot: Secondary | ICD-10-CM

## 2022-10-29 DIAGNOSIS — M79672 Pain in left foot: Secondary | ICD-10-CM

## 2022-10-29 DIAGNOSIS — M722 Plantar fascial fibromatosis: Secondary | ICD-10-CM

## 2022-11-02 ENCOUNTER — Other Ambulatory Visit: Payer: Self-pay | Admitting: Family Medicine

## 2022-11-02 DIAGNOSIS — M79673 Pain in unspecified foot: Secondary | ICD-10-CM

## 2022-11-03 MED ORDER — BUPROPION HCL ER (XL) 300 MG PO TB24: 300.0000 mg | ORAL_TABLET | Freq: Every morning | ORAL | 2 refills | Status: DC

## 2022-11-03 NOTE — Telephone Encounter (Signed)
Refill request for  tramadol 50 mg tablet   LOV - 09/21/22 ov with Mayra Reel, NP Next OV - not scheduled Last refill - 09/01/22 #30/1  *[facility] closed and rx needs to be sent to [facility]*

## 2022-11-04 ENCOUNTER — Ambulatory Visit: Payer: Medicare HMO | Attending: Cardiovascular Disease | Admitting: Cardiovascular Disease

## 2022-11-04 ENCOUNTER — Encounter: Payer: Self-pay | Admitting: Cardiovascular Disease

## 2022-11-04 VITALS — BP 134/82 | HR 95 | Ht 65.0 in | Wt 257.0 lb

## 2022-11-04 DIAGNOSIS — I10 Essential (primary) hypertension: Secondary | ICD-10-CM | POA: Diagnosis not present

## 2022-11-04 DIAGNOSIS — I5032 Chronic diastolic (congestive) heart failure: Secondary | ICD-10-CM | POA: Diagnosis not present

## 2022-11-04 DIAGNOSIS — I700 Atherosclerosis of aorta: Secondary | ICD-10-CM

## 2022-11-04 DIAGNOSIS — N1831 Chronic kidney disease, stage 3a: Secondary | ICD-10-CM

## 2022-11-04 DIAGNOSIS — E1165 Type 2 diabetes mellitus with hyperglycemia: Secondary | ICD-10-CM | POA: Diagnosis not present

## 2022-11-04 MED ORDER — TRAMADOL HCL 50 MG PO TABS
50.0000 mg | ORAL_TABLET | Freq: Three times a day (TID) | ORAL | 1 refills | Status: DC | PRN
Start: 2022-11-04 — End: 2023-01-21

## 2022-11-04 NOTE — Progress Notes (Signed)
Cardiology Office Note:    Date:  11/04/2022   ID:  Kathleen Hatfield, DOB 01/11/1956, MRN [id number]  PCP:  Kathleen Nam, MD   [facility] Providers Cardiologist:  Kathleen Bollman, MD Cardiology APP:  Kathleen Hatfield     Referring MD: Kathleen Nam, MD   Chief Complaint  Patient presents with   Shortness of Breath    History of Present Illness:    Kathleen Hatfield is a 67 y.o. 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 She is using oxygen at night and most of the time during the day.  She is not feeling very well today.  Feeling "shaky."  She was placed on oxygen in the office and began feeling better.  Shortness of breath is okay at rest but she does have significant shortness of breath with activity.  No chest pain or pressure.  No orthopnea.  Chronic leg edema is stable.  States that she is taking metolazone about once per week when her weight goes up.  She is taking furosemide consistently 60 mg twice daily.  Past Medical History:  Diagnosis Date   Anemia    Arthritis    Asthma    Chronic diastolic CHF 05/2014   Echocardiogram 05/2019: EF 70, no RWMA, mild LVH, Gr 1 DD, normal RVSF, severe LVH, borderline asc Aorta (39 mm)   CKD (chronic kidney disease)    Depression    Diabetes mellitus without complication (HCC)    Diverticulitis    Dyspnea    with exertion   GERD (gastroesophageal reflux disease)    History of blood transfusion    Hyperlipidemia    cannot tolerate statins    Hypertension    patient states she has never had high blood pressure.    Nuclear stress test    Myoview 05/2019: EF 83, no ischemia or infarction; Low Risk   Peripheral neuropathy    Pneumonia    PONV (postoperative nausea and vomiting)    RLS (restless legs syndrome)    Sleep apnea    uses CPAP   Ulcerative colitis (HCC)    dr Loreta Ave    Past Surgical History:  Procedure Laterality Date   ABDOMINAL HYSTERECTOMY     APPENDECTOMY     BIOPSY  10/10/2021   Procedure: BIOPSY;  Surgeon: Jeani Hawking, MD;  Location: [facility];  Service: Gastroenterology;;   BREAST BIOPSY Left    BREAST SURGERY     left biopsy   CARDIAC CATHETERIZATION     CESAREAN SECTION     CHOLECYSTECTOMY     COLONOSCOPY WITH PROPOFOL N/A 10/10/2021   Procedure: COLONOSCOPY WITH PROPOFOL;  Surgeon: Jeani Hawking, MD;  Location: [facility];  Service: Gastroenterology;  Laterality: N/A;   HERNIA REPAIR     INSERTION OF MESH N/A 11/15/2015   Procedure: INSERTION OF MESH;  Surgeon: Karie Soda, MD;  Location: [facility];  Service: General;  Laterality: N/A;   LAPAROSCOPIC LYSIS OF ADHESIONS N/A 11/15/2015   Procedure: LAPAROSCOPIC LYSIS OF ADHESIONS;  Surgeon: Karie Soda, MD;  Location:  [facility];  Service: General;  Laterality: N/A;   RIGHT HEART CATHETERIZATION N/A 02/27/2013   Procedure: RIGHT HEART CATH;  Surgeon: Micheline Chapman, MD;  Location: [facility];  Service: Cardiovascular;  Laterality: N/A;   RIGHT HEART CATHETERIZATION N/A 12/14/2013   Procedure: RIGHT HEART CATH;  Surgeon: Laurey Morale, MD;  Location: [facility] CATH LAB;  Service: Cardiovascular;  Laterality: N/A;   SIGMOIDECTOMY  2010   diverticular disease   VENTRAL HERNIA REPAIR N/A 11/15/2015   Procedure: LAPAROSCOPIC VENTRAL WALL HERNIA REPAIR;  Surgeon: Karie Soda, MD;  Location: [facility];  Service: General;  Laterality: N/A;    Current Medications: Current Meds  Medication Sig   acetaminophen (TYLENOL) 500 MG tablet Take 500 mg by mouth every  6 (six) hours as needed for moderate pain.   albuterol (VENTOLIN HFA) 108 (90 Base) MCG/ACT inhaler INHALE TWO PUFFS BY MOUTH INTO LUNGS every SIX hours AS NEEDED   Alirocumab (PRALUENT) 150 MG/ML SOAJ Inject 1 mL (150 mg total) into the skin every 14 (fourteen) days.   aspirin EC 81 MG tablet Take 1 tablet (81 mg total) by mouth daily. Swallow whole.   BAYER CONTOUR NEXT TEST test strip 1 each by Other route daily. As directed   buPROPion (WELLBUTRIN XL) 300 MG 24 hr tablet Take 1 tablet (300 mg total) by mouth every morning.   Cholecalciferol (VITAMIN D-3) 5000 UNITS TABS Take 5,000 Units by mouth every other day.    colchicine 0.6 MG tablet TAKE 1 TABLET (0.6 MG TOTAL) BY MOUTH DAILY AS NEEDED (FOR GOUT)   Continuous Blood Gluc Receiver (DEXCOM G7 RECEIVER) DEVI by Does not apply route.   Continuous Blood Gluc Sensor (DEXCOM G7 SENSOR) MISC 1 Device by Does not apply route as directed. Apply sensor every 10 days   dicyclomine (BENTYL) 20 MG tablet TAKE 1 TABLET UP TO FOUR TIMES A DAY FOR GI CRAMPING, PAIN, NAUSEA, AND VOMITING AS NEEDED   DULoxetine (CYMBALTA) 60 MG capsule TAKE ONE CAPSULE BY MOUTH EVERYDAY AT BEDTIME   estradiol (ESTRACE) 0.1 MG/GM vaginal cream Place 1 Applicatorful vaginally 3 (three) times a week.   FEROSUL 325 (65 Fe) MG tablet TAKE ONE TABLET BY MOUTH every other evening   fluticasone (FLONASE) 50 MCG/ACT nasal spray Place 2 sprays into both nostrils daily. (Patient taking differently: Place 2 sprays into both nostrils as needed.)   furosemide (LASIX) 20 MG tablet Take 3 tablets (60 mg total) by mouth 2 (two) times daily.   gabapentin (NEURONTIN) 300 MG capsule TAKE ONE CAPSULE BY MOUTH EVERYDAY AT BEDTIME May take additional capsule daily if needed   insulin aspart (NOVOLOG FLEXPEN) 100 UNIT/ML FlexPen Max daily 45 units   insulin degludec (TRESIBA FLEXTOUCH) 200 UNIT/ML FlexTouch Pen Inject 98 Units into the skin daily. (Patient taking differently: Inject 110 Units into  the skin daily.)   Insulin Pen Needle (GLOBAL EASE INJECT PEN NEEDLES) 32G X 4 MM MISC 1 Device by Other route in the morning, at noon, in the evening, and at bedtime.   ipratropium-albuterol (DUONEB) 0.5-2.5 (3) MG/3ML SOLN Take 3 mLs by nebulization every 6 (six) hours as needed (wheezing).   ketoconazole (NIZORAL) 2 % cream APPLY TO AFFECTED AREA TWICE A DAY (Patient taking differently: as needed.)   meloxicam (MOBIC) 15 MG tablet TAKE 1 TABLET (15 MG TOTAL) BY MOUTH DAILY. (Patient taking differently: Take 15 mg by mouth as needed.)   mesalamine (LIALDA) 1.2 g EC tablet Take 4.8 g by mouth  daily.   metolazone (ZAROXOLYN) 5 MG tablet TAKE 1 TABLET BY MOUTH EVERY DAY AS NEEDED   MICROLET LANCETS MISC 1 each by Other route. As directed   nitrofurantoin (MACRODANTIN) 50 MG capsule Take 50 mg by mouth daily.   nystatin (MYCOSTATIN/NYSTOP) powder Apply 1 application topically 3 (three) times daily. (Patient taking differently: Apply 1 application  topically as needed.)   ondansetron (ZOFRAN) 4 MG tablet TAKE ONE TABLET BY MOUTH every EIGHT hours AS NEEDED FOR NAUSEA AND VOMITING   OXYGEN Inhale 2 L into the lungs continuous.   pantoprazole (PROTONIX) 40 MG tablet TAKE ONE TABLET BY MOUTH EVERY MORNING   polyethylene glycol (MIRALAX / GLYCOLAX) packet Take 17 g by mouth daily as needed for mild constipation.   potassium chloride (MICRO-K) 10 MEQ CR capsule TAKE 5 CAPSULES BY MOUTH AT NOON   rOPINIRole (REQUIP) 4 MG tablet TAKE 1 TABLET BY MOUTH EVERYDAY AT BEDTIME   Semaglutide, 2 MG/DOSE, 8 MG/3ML SOPN Inject 2 mg as directed once a week.   simethicone (GAS-X) 80 MG chewable tablet Chew 1 tablet (80 mg total) by mouth every 6 (six) hours as needed for flatulence.   spironolactone (ALDACTONE) 25 MG tablet Take 0.5 tablets (12.5 mg total) by mouth daily.   UNABLE TO FIND CPAP- At bedtime   [DISCONTINUED] predniSONE (DELTASONE) 20 MG tablet Take 2 tablets by mouth once daily for 5 days.    [DISCONTINUED] traMADol (ULTRAM) 50 MG tablet Take 1 tablet (50 mg total) by mouth 3 (three) times daily as needed.     Allergies:   Almond oil, Morphine and codeine, Atorvastatin, Ceclor [cefaclor], Sulfa antibiotics, Ciprofloxacin, Levaquin [levofloxacin], Losartan, Peanut-containing drug products, Primidone, Repatha [evolocumab], and Statins   Social History   Socioeconomic History   Marital status: Divorced    Spouse name: Not on file   Number of children: Not on file   Years of education: Not on file   Highest education level: Not on file  Occupational History   Occupation: unemployed  Tobacco Use   Smoking status: Never    Passive exposure: Past   Smokeless tobacco: Never  Vaping Use   Vaping status: Never Used  Substance and Sexual Activity   Alcohol use: No   Drug use: No   Sexual activity: Not on file  Other Topics Concern   Not on file  Social History Narrative   Live with spouse   Right handed   Drinks caffiene prn   One floor home   retired   International aid/development worker of Corporate investment banker Strain: Low Risk  (07/14/2022)   Overall Financial Resource Strain (CARDIA)    Difficulty of Paying Living Expenses: Not hard at all  Food Insecurity: No Food Insecurity (07/14/2022)   Hunger Vital Sign    Worried About Running Out of Food in the Last Year: Never true    Ran Out of Food in the Last Year: Never true  Transportation Needs: No Transportation Needs (07/14/2022)   PRAPARE - Administrator, Civil Service (Medical): No    Lack of Transportation (Non-Medical): No  Physical Activity: Insufficiently Active (07/14/2022)   Exercise Vital Sign    Days of Exercise per Week: 7 days    Minutes of Exercise per Session: 10 min  Stress: No Stress Concern Present (07/14/2022)   [employer] of Occupational Health - Occupational Stress Questionnaire    Feeling of Stress : Not at all  Social Connections: Socially Isolated (07/14/2022)  Social Oncologist [NHANES]    Frequency of Communication with Friends and Family: Never    Frequency of Social Gatherings with Friends and Family: Never    Attends Religious Services: Never    Database administrator or Organizations: No    Attends Engineer, structural: Never    Marital Status: Divorced     Family History: The patient's family history includes COPD in her father; Dementia in her mother; Diabetes in her maternal grandmother; Heart disease in her brother, father, and mother; Hypertension in her father and mother; Other in her brother and brother. There is no history of Breast cancer or Colon cancer.  ROS:   Please see the history of present illness.    Bilateral knee pain and leg weakness.  Shaking.  All other systems reviewed and are negative.  EKGs/Labs/Other Studies Reviewed:    The following studies were reviewed today: Cardiac MR 01-01-2020: IMPRESSION: 1. Normal LV size with mild LV hypertrophy, hyperdynamic systolic function with EF 78%.   2. Normal RV size and systolic function, EF 64%. There does not appear to be true RV hypertrophy present. There is very thick epicardial adipose tissue that may have been mistaken as RVH on the echo.   3. There was a small area of mid-wall LGE at the RV insertion site. This is nonspecific and may be suggestive of elevated filling pressures. EKG Interpretation Date/Time:  Wednesday November 04 2022 14:39:27 EDT Ventricular Rate:  95 PR Interval:  190 QRS Duration:  140 QT Interval:  416 QTC Calculation: 522 R Axis:   -88  Text Interpretation: Normal sinus rhythm Right bundle branch block Left anterior fascicular block Bifascicular block When compared with ECG of 31-Mar-2021 18:09, Right bundle branch block has replaced Incomplete right bundle branch block Confirmed by Kathleen Hatfield [phone]) on 11/04/2022 3:01:02 PM    Recent Labs: 11/07/2021: TSH 3.14 02/25/2022: Magnesium 2.0 08/14/2022: ALT 25; BUN 23;  Creatinine, Ser 1.33; Potassium 3.3; Sodium 137 09/21/2022: Hemoglobin 13.6; Platelets 264.0; Pro B Natriuretic peptide (BNP) 45.0  Recent Lipid Panel    Component Value Date/Time   CHOL 202 (H) 11/25/2021 1258   CHOL 333 04/26/2019 0000   TRIG 297 (H) 11/25/2021 1258   TRIG 469 (A) 04/26/2019 0000   HDL 47 11/25/2021 1258   CHOLHDL 4.3 11/25/2021 1258   CHOLHDL 6.9 03/25/2013 0500   VLDL 69 (H) 03/25/2013 0500   LDLCALC 105 (H) 11/25/2021 1258   LDLDIRECT 96 02/25/2022 1031     Risk Assessment/Calculations:                Physical Exam:    VS:  BP 134/82   Pulse 95   Ht 5\' 5"  (1.651 m)   Wt 257 lb (116.6 kg)   SpO2 95%   BMI 42.77 kg/m     Wt Readings from Last 3 Encounters:  11/04/22 257 lb (116.6 kg)  10/06/22 257 lb (116.6 kg)  09/21/22 270 lb (122.5 kg)     GEN:  Well nourished, well developed obese woman in no acute distress HEENT: Normal NECK: No JVD; No carotid bruits LYMPHATICS: No lymphadenopathy CARDIAC: RRR, no murmurs, rubs, gallops RESPIRATORY:  Clear to auscultation with bilateral end expiratory wheezing ABDOMEN: Soft, non-tender, non-distended MUSCULOSKELETAL:  trace bilateral pretibial edema; No deformity  SKIN: Warm and dry NEUROLOGIC:  Alert and oriented x 3 PSYCHIATRIC:  Normal affect   ASSESSMENT:    1. Chronic heart failure with preserved ejection fraction (HCC)  2. Essential hypertension   3. Aortic atherosclerosis (HCC)   4. Type 2 diabetes mellitus with hyperglycemia, without long-term current use of insulin (HCC)   5. Stage 3a chronic kidney disease (HCC)    PLAN:    In order of problems listed above:  The patient has had a clinical picture of right heart failure in the past but also has LVH and diastolic dysfunction on past echo assessment.  She will continue on her current diuretic regimen.  I will update labs today with a metabolic panel.  I think she should have an updated echo as well as it has been 3 years from the time  of her last study.  She will continue on furosemide 60 mg twice daily and as needed use of Zaroxolyn.  We need to keep a close eye on her renal function. Blood pressure is controlled on current medical therapy Treated with Praluent and low-dose aspirin Very poor glycemic control.  Recent hemoglobin A1c is 12.9.  Primary care physician/endocrinology making adjustments in her regimen.  She is working on lifestyle modification Check metabolic panel today           Medication Adjustments/Labs and Tests Ordered: Current medicines are reviewed at length with the patient today.  Concerns regarding medicines are outlined above.  Orders Placed This Encounter  Procedures   Comprehensive metabolic panel   EKG 12-Lead   ECHOCARDIOGRAM COMPLETE   No orders of the defined types were placed in this encounter.   Patient Instructions  Medication Instructions:  Your physician recommends that you continue on your current medications as directed. Please refer to the Current Medication list given to you today.  *If you need a refill on your cardiac medications before your next appointment, please call your pharmacy*  Lab Work: CMET today If you have labs (blood work) drawn today and your tests are completely normal, you will receive your results only by: MyChart Message (if you have MyChart) OR A paper copy in the mail If you have any lab test that is abnormal or we need to change your treatment, we will call you to review the results.  Testing/Procedures: ECHO Your physician has requested that you have an echocardiogram. Echocardiography is a painless test that uses sound waves to create images of your heart. It provides your doctor with information about the size and shape of your heart and how well your heart's chambers and valves are working. This procedure takes approximately one hour. There are no restrictions for this procedure. Please do NOT wear cologne, perfume, aftershave, or lotions  (deodorant is allowed). Please arrive 15 minutes prior to your appointment time.  Follow-Up: At [facility], you and your health needs are our priority.  As part of our continuing mission to provide you with exceptional heart care, we have created designated Provider Care Teams.  These Care Teams include your primary Cardiologist (physician) and Advanced Practice Providers (APPs -  Physician Assistants and Nurse Practitioners) who all work together to provide you with the care you need, when you need it.  Your next appointment:   6 month(s)  Provider:   Jari Favre, PA-C, Ronie Spies, PA-C, Eligha Bridegroom, NP, Tereso Newcomer, PA-C, or Perlie Gold, PA-C     Then, Kathleen Bollman, MD will plan to see you again in 1 year(s).       Signed, Kathleen Bollman, MD  11/04/2022 5:00 PM     [facility]

## 2022-11-04 NOTE — Patient Instructions (Signed)
Medication Instructions:  Your physician recommends that you continue on your current medications as directed. Please refer to the Current Medication list given to you today.  *If you need a refill on your cardiac medications before your next appointment, please call your pharmacy*  Lab Work: CMET today If you have labs (blood work) drawn today and your tests are completely normal, you will receive your results only by: MyChart Message (if you have MyChart) OR A paper copy in the mail If you have any lab test that is abnormal or we need to change your treatment, we will call you to review the results.  Testing/Procedures: ECHO Your physician has requested that you have an echocardiogram. Echocardiography is a painless test that uses sound waves to create images of your heart. It provides your doctor with information about the size and shape of your heart and how well your heart's chambers and valves are working. This procedure takes approximately one hour. There are no restrictions for this procedure. Please do NOT wear cologne, perfume, aftershave, or lotions (deodorant is allowed). Please arrive 15 minutes prior to your appointment time.  Follow-Up: At [facility], you and your health needs are our priority.  As part of our continuing mission to provide you with exceptional heart care, we have created designated Provider Care Teams.  These Care Teams include your primary Cardiologist (physician) and Advanced Practice Providers (APPs -  Physician Assistants and Nurse Practitioners) who all work together to provide you with the care you need, when you need it.  Your next appointment:   6 month(s)  Provider:   Jari Favre, PA-C, Ronie Spies, PA-C, Eligha Bridegroom, NP, Tereso Newcomer, PA-C, or Perlie Gold, PA-C     Then, Tonny Bollman, MD will plan to see you again in 1 year(s).

## 2022-11-05 LAB — COMPREHENSIVE METABOLIC PANEL
ALT: 23 IU/L (ref 0–32)
AST: 33 IU/L (ref 0–40)
Albumin: 3.9 g/dL (ref 3.9–4.9)
Alkaline Phosphatase: 110 IU/L (ref 44–121)
BUN/Creatinine Ratio: 23 (ref 12–28)
BUN: 30 mg/dL — ABNORMAL HIGH (ref 8–27)
Bilirubin Total: 0.4 mg/dL (ref 0.0–1.2)
CO2: 32 mmol/L — ABNORMAL HIGH (ref 20–29)
Calcium: 10 mg/dL (ref 8.7–10.3)
Chloride: 91 mmol/L — ABNORMAL LOW (ref 96–106)
Creatinine, Ser: 1.29 mg/dL — ABNORMAL HIGH (ref 0.57–1.00)
Globulin, Total: 2.7 g/dL (ref 1.5–4.5)
Glucose: 192 mg/dL — ABNORMAL HIGH (ref 70–99)
Potassium: 3.5 mmol/L (ref 3.5–5.2)
Sodium: 143 mmol/L (ref 134–144)
Total Protein: 6.6 g/dL (ref 6.0–8.5)
eGFR: 45 mL/min/{1.73_m2} — ABNORMAL LOW (ref >59)

## 2022-11-06 NOTE — Progress Notes (Unsigned)
Assessment/Plan:   1.  Tremor.             -Very little tremor was noted on examination today.  She admits that tremor seems to come and go.  Discussed with her that I continue to wonder if medications for her COPD are not driving her tremor.  Do not see any significant degree of essential tremor.   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     Subjective:   Kathleen Hatfield was seen today in follow up for tremor.  Pt with husband who supplements hx. 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  She followed up 23-month later with my PA and reported that she was taking 1/4 tablet of primidone every other day.  She talked to my PA about mood and how it affects her movements and, according to the notes, she was thinking about counseling because several doctors had talked to her about that.  My PA recommended that she go ahead and increase her primidone to 50 mg at bedtime.  She referred her to physical therapy for strength and balance.  I did look at her May, 2024 office visit with primary care and even in the midst of the same office visit her primary care noted that "her exam changed over the course of the interview for the better."  He noted that her tremor improved and resolved as the office visit went on     CURRENT MEDICATIONS:  Outpatient Encounter Medications as of 11/10/2022  Medication Sig   acetaminophen (TYLENOL) 500 MG tablet Take 500 mg by mouth every 6 (six) hours as needed for moderate pain.   albuterol (VENTOLIN HFA) 108 (90 Base) MCG/ACT inhaler INHALE TWO PUFFS BY MOUTH INTO LUNGS every SIX hours AS NEEDED   Alirocumab (PRALUENT) 150 MG/ML SOAJ Inject 1 mL (150 mg total) into the skin every 14 (fourteen) days.   aspirin EC 81 MG tablet Take 1 tablet (81 mg total) by mouth daily. Swallow whole.   BAYER CONTOUR NEXT TEST test strip 1 each by Other route  daily. As directed   buPROPion (WELLBUTRIN XL) 300 MG 24 hr tablet Take 1 tablet (300 mg total) by mouth every morning.   Cholecalciferol (VITAMIN D-3) 5000 UNITS TABS Take 5,000 Units by mouth every other day.    colchicine 0.6 MG tablet TAKE 1 TABLET (0.6 MG TOTAL) BY MOUTH DAILY AS NEEDED (FOR GOUT)   Continuous Blood Gluc Receiver (DEXCOM G7 RECEIVER) DEVI by Does not apply route.   Continuous Blood Gluc Sensor (DEXCOM G7 SENSOR) MISC 1 Device by Does not apply route as directed. Apply sensor every 10 days   dicyclomine (BENTYL) 20 MG tablet TAKE 1 TABLET UP TO FOUR TIMES A DAY FOR GI CRAMPING, PAIN, NAUSEA, AND VOMITING AS NEEDED   DULoxetine (CYMBALTA) 60 MG capsule TAKE ONE CAPSULE BY MOUTH EVERYDAY AT BEDTIME   estradiol (ESTRACE) 0.1 MG/GM vaginal cream Place 1 Applicatorful vaginally 3 (three) times a week.   FEROSUL 325 (65 Fe) MG tablet TAKE ONE TABLET BY MOUTH every other evening   fluticasone (FLONASE) 50 MCG/ACT nasal spray Place 2 sprays into both nostrils daily. (Patient taking differently: Place 2 sprays into both nostrils as needed.)   furosemide (LASIX) 20 MG tablet Take 3 tablets (60 mg total) by mouth 2 (two) times daily.   gabapentin (  NEURONTIN) 300 MG capsule TAKE ONE CAPSULE BY MOUTH EVERYDAY AT BEDTIME May take additional capsule daily if needed   insulin aspart (NOVOLOG FLEXPEN) 100 UNIT/ML FlexPen Max daily 45 units   insulin degludec (TRESIBA FLEXTOUCH) 200 UNIT/ML FlexTouch Pen Inject 98 Units into the skin daily. (Patient taking differently: Inject 110 Units into the skin daily.)   Insulin Pen Needle (GLOBAL EASE INJECT PEN NEEDLES) 32G X 4 MM MISC 1 Device by Other route in the morning, at noon, in the evening, and at bedtime.   ipratropium-albuterol (DUONEB) 0.5-2.5 (3) MG/3ML SOLN Take 3 mLs by nebulization every 6 (six) hours as needed (wheezing).   ketoconazole (NIZORAL) 2 % cream APPLY TO AFFECTED AREA TWICE A DAY (Patient taking differently: as needed.)    meloxicam (MOBIC) 15 MG tablet TAKE 1 TABLET (15 MG TOTAL) BY MOUTH DAILY. (Patient taking differently: Take 15 mg by mouth as needed.)   mesalamine (LIALDA) 1.2 g EC tablet Take 4.8 g by mouth daily.   metolazone (ZAROXOLYN) 5 MG tablet TAKE 1 TABLET BY MOUTH EVERY DAY AS NEEDED   MICROLET LANCETS MISC 1 each by Other route. As directed   nitrofurantoin (MACRODANTIN) 50 MG capsule Take 50 mg by mouth daily.   nystatin (MYCOSTATIN/NYSTOP) powder Apply 1 application topically 3 (three) times daily. (Patient taking differently: Apply 1 application  topically as needed.)   ondansetron (ZOFRAN) 4 MG tablet TAKE ONE TABLET BY MOUTH every EIGHT hours AS NEEDED FOR NAUSEA AND VOMITING   OXYGEN Inhale 2 L into the lungs continuous.   pantoprazole (PROTONIX) 40 MG tablet TAKE ONE TABLET BY MOUTH EVERY MORNING   polyethylene glycol (MIRALAX / GLYCOLAX) packet Take 17 g by mouth daily as needed for mild constipation.   potassium chloride (MICRO-K) 10 MEQ CR capsule TAKE 5 CAPSULES BY MOUTH AT NOON   rOPINIRole (REQUIP) 4 MG tablet TAKE 1 TABLET BY MOUTH EVERYDAY AT BEDTIME   Semaglutide, 2 MG/DOSE, 8 MG/3ML SOPN Inject 2 mg as directed once a week.   simethicone (GAS-X) 80 MG chewable tablet Chew 1 tablet (80 mg total) by mouth every 6 (six) hours as needed for flatulence.   spironolactone (ALDACTONE) 25 MG tablet Take 0.5 tablets (12.5 mg total) by mouth daily.   traMADol (ULTRAM) 50 MG tablet Take 1 tablet (50 mg total) by mouth 3 (three) times daily as needed.   UNABLE TO FIND CPAP- At bedtime   No facility-administered encounter medications on file as of 11/10/2022.     Objective:   PHYSICAL EXAMINATION:    VITALS:   There were no vitals filed for this visit.   Gen:  Appears stated age and in NAD. HEENT:  Normocephalic, atraumatic. The mucous membranes are moist. The superficial temporal arteries are without ropiness or tenderness. Cardiovascular: Regular rate and rhythm. Lungs: There are  mild diffuse expiratory wheezes, but otherwise she is clear. Neck: There are no carotid bruits noted bilaterally.   NEUROLOGICAL:   Orientation:  The patient is alert and oriented x 3.   Cranial nerves: There is good facial symmetry. Extraocular muscles are intact and visual fields are full to confrontational testing. Speech is fluent and clear. Soft palate rises symmetrically and there is no tongue deviation. Hearing is intact to conversational tone. Tone: Tone is good throughout. Sensation: Sensation is intact to light touch touch throughout  Coordination:  The patient has no dysdiadichokinesia or dysmetria. Motor: Strength is at least antigravity x 4 Gait and Station: The patient pushes off from the  transport chair that she was sitting in.  She is wide based and somewhat unsteady (she attributes to knee issues).  She has most trouble in the turn.     MOVEMENT EXAM: Tremor: There is no rest tremor.  There is almost no postural tremor.  When I first walked into the office, she grabbed her phone and held it in the right hand and showed me the tremulous motion.  There was some entrainment.  She draws Archimedes spiral as well.  Lab Results  Component Value Date   IRON 54 11/07/2021   TIBC 430 09/19/2019   FERRITIN 64.4 11/28/2020     Total time spent on today's visit was *** minutes, including both face-to-face time and nonface-to-face time.  Time included that spent on review of records (prior notes available to me/labs/imaging if pertinent), discussing treatment and goals, answering patient's questions and coordinating care.  Cc:  Joaquim Nam, MD

## 2022-11-10 ENCOUNTER — Ambulatory Visit: Payer: Medicare HMO | Admitting: Neurology

## 2022-11-10 VITALS — BP 110/72 | HR 92 | Ht 65.0 in | Wt 246.6 lb

## 2022-11-10 DIAGNOSIS — R2681 Unsteadiness on feet: Secondary | ICD-10-CM

## 2022-11-10 DIAGNOSIS — R251 Tremor, unspecified: Secondary | ICD-10-CM | POA: Diagnosis not present

## 2022-11-10 NOTE — Patient Instructions (Signed)
Look at [facility] under physical/occupational therapy aids and there is a glove called [employer] and buy this.    I would also like you to trial weighted spoons/forks

## 2022-11-20 DIAGNOSIS — J449 Chronic obstructive pulmonary disease, unspecified: Secondary | ICD-10-CM | POA: Diagnosis not present

## 2022-11-23 ENCOUNTER — Encounter (HOSPITAL_COMMUNITY): Payer: Self-pay

## 2022-11-23 ENCOUNTER — Telehealth: Payer: Self-pay | Admitting: Family Medicine

## 2022-11-23 ENCOUNTER — Other Ambulatory Visit: Payer: Self-pay

## 2022-11-23 ENCOUNTER — Emergency Department (HOSPITAL_COMMUNITY): Payer: Medicare HMO

## 2022-11-23 ENCOUNTER — Inpatient Hospital Stay (HOSPITAL_COMMUNITY)
Admission: EM | Admit: 2022-11-23 | Discharge: 2022-11-30 | DRG: 190 | Disposition: A | Discharge status: Home Health | Payer: Medicare HMO | Attending: Internal Medicine | Admitting: Internal Medicine

## 2022-11-23 ENCOUNTER — Ambulatory Visit (INDEPENDENT_AMBULATORY_CARE_PROVIDER_SITE_OTHER)
Admission: RE | Admit: 2022-11-23 | Discharge: 2022-11-23 | Disposition: A | Discharge status: Home / Self Care | Payer: Medicare HMO | Source: Ambulatory Visit | Attending: Family | Admitting: Family

## 2022-11-23 ENCOUNTER — Ambulatory Visit (INDEPENDENT_AMBULATORY_CARE_PROVIDER_SITE_OTHER): Payer: Medicare HMO | Admitting: Family

## 2022-11-23 VITALS — BP 144/68 | HR 78 | Temp 97.5°F

## 2022-11-23 DIAGNOSIS — Z882 Allergy status to sulfonamides status: Secondary | ICD-10-CM

## 2022-11-23 DIAGNOSIS — J449 Chronic obstructive pulmonary disease, unspecified: Secondary | ICD-10-CM

## 2022-11-23 DIAGNOSIS — R918 Other nonspecific abnormal finding of lung field: Secondary | ICD-10-CM | POA: Diagnosis not present

## 2022-11-23 DIAGNOSIS — K219 Gastro-esophageal reflux disease without esophagitis: Secondary | ICD-10-CM | POA: Diagnosis present

## 2022-11-23 DIAGNOSIS — E11649 Type 2 diabetes mellitus with hypoglycemia without coma: Secondary | ICD-10-CM | POA: Diagnosis not present

## 2022-11-23 DIAGNOSIS — E162 Hypoglycemia, unspecified: Secondary | ICD-10-CM | POA: Diagnosis not present

## 2022-11-23 DIAGNOSIS — R0602 Shortness of breath: Secondary | ICD-10-CM

## 2022-11-23 DIAGNOSIS — Z6841 Body Mass Index (BMI) 40.0 and over, adult: Secondary | ICD-10-CM

## 2022-11-23 DIAGNOSIS — J9611 Chronic respiratory failure with hypoxia: Secondary | ICD-10-CM | POA: Insufficient documentation

## 2022-11-23 DIAGNOSIS — E1165 Type 2 diabetes mellitus with hyperglycemia: Secondary | ICD-10-CM | POA: Diagnosis present

## 2022-11-23 DIAGNOSIS — E1169 Type 2 diabetes mellitus with other specified complication: Secondary | ICD-10-CM | POA: Diagnosis present

## 2022-11-23 DIAGNOSIS — Z794 Long term (current) use of insulin: Secondary | ICD-10-CM | POA: Diagnosis not present

## 2022-11-23 DIAGNOSIS — G2581 Restless legs syndrome: Secondary | ICD-10-CM | POA: Diagnosis not present

## 2022-11-23 DIAGNOSIS — I5030 Unspecified diastolic (congestive) heart failure: Secondary | ICD-10-CM | POA: Diagnosis present

## 2022-11-23 DIAGNOSIS — I2489 Other forms of acute ischemic heart disease: Secondary | ICD-10-CM | POA: Diagnosis not present

## 2022-11-23 DIAGNOSIS — N183 Chronic kidney disease, stage 3 unspecified: Secondary | ICD-10-CM | POA: Diagnosis not present

## 2022-11-23 DIAGNOSIS — Z9102 Food additives allergy status: Secondary | ICD-10-CM

## 2022-11-23 DIAGNOSIS — I5A Non-ischemic myocardial injury (non-traumatic): Secondary | ICD-10-CM | POA: Diagnosis not present

## 2022-11-23 DIAGNOSIS — E119 Type 2 diabetes mellitus without complications: Secondary | ICD-10-CM

## 2022-11-23 DIAGNOSIS — E1122 Type 2 diabetes mellitus with diabetic chronic kidney disease: Secondary | ICD-10-CM | POA: Diagnosis not present

## 2022-11-23 DIAGNOSIS — I5032 Chronic diastolic (congestive) heart failure: Secondary | ICD-10-CM | POA: Diagnosis present

## 2022-11-23 DIAGNOSIS — Z1152 Encounter for screening for COVID-19: Secondary | ICD-10-CM

## 2022-11-23 DIAGNOSIS — Z888 Allergy status to other drugs, medicaments and biological substances status: Secondary | ICD-10-CM

## 2022-11-23 DIAGNOSIS — B348 Other viral infections of unspecified site: Secondary | ICD-10-CM | POA: Insufficient documentation

## 2022-11-23 DIAGNOSIS — B9789 Other viral agents as the cause of diseases classified elsewhere: Secondary | ICD-10-CM | POA: Diagnosis present

## 2022-11-23 DIAGNOSIS — E782 Mixed hyperlipidemia: Secondary | ICD-10-CM | POA: Diagnosis present

## 2022-11-23 DIAGNOSIS — I130 Hypertensive heart and chronic kidney disease with heart failure and stage 1 through stage 4 chronic kidney disease, or unspecified chronic kidney disease: Secondary | ICD-10-CM | POA: Diagnosis not present

## 2022-11-23 DIAGNOSIS — E876 Hypokalemia: Secondary | ICD-10-CM | POA: Diagnosis not present

## 2022-11-23 DIAGNOSIS — Z7985 Long-term (current) use of injectable non-insulin antidiabetic drugs: Secondary | ICD-10-CM

## 2022-11-23 DIAGNOSIS — I2510 Atherosclerotic heart disease of native coronary artery without angina pectoris: Secondary | ICD-10-CM | POA: Diagnosis present

## 2022-11-23 DIAGNOSIS — Z79818 Long term (current) use of other agents affecting estrogen receptors and estrogen levels: Secondary | ICD-10-CM

## 2022-11-23 DIAGNOSIS — K5190 Ulcerative colitis, unspecified, without complications: Secondary | ICD-10-CM | POA: Diagnosis not present

## 2022-11-23 DIAGNOSIS — J441 Chronic obstructive pulmonary disease with (acute) exacerbation: Secondary | ICD-10-CM | POA: Diagnosis not present

## 2022-11-23 DIAGNOSIS — Z881 Allergy status to other antibiotic agents status: Secondary | ICD-10-CM

## 2022-11-23 DIAGNOSIS — J209 Acute bronchitis, unspecified: Secondary | ICD-10-CM | POA: Diagnosis present

## 2022-11-23 DIAGNOSIS — Z7982 Long term (current) use of aspirin: Secondary | ICD-10-CM

## 2022-11-23 DIAGNOSIS — Z833 Family history of diabetes mellitus: Secondary | ICD-10-CM

## 2022-11-23 DIAGNOSIS — E785 Hyperlipidemia, unspecified: Secondary | ICD-10-CM | POA: Diagnosis present

## 2022-11-23 DIAGNOSIS — J9621 Acute and chronic respiratory failure with hypoxia: Secondary | ICD-10-CM | POA: Diagnosis present

## 2022-11-23 DIAGNOSIS — R079 Chest pain, unspecified: Secondary | ICD-10-CM | POA: Diagnosis not present

## 2022-11-23 DIAGNOSIS — G8929 Other chronic pain: Secondary | ICD-10-CM | POA: Diagnosis not present

## 2022-11-23 DIAGNOSIS — F32A Depression, unspecified: Secondary | ICD-10-CM | POA: Diagnosis present

## 2022-11-23 DIAGNOSIS — Z791 Long term (current) use of non-steroidal anti-inflammatories (NSAID): Secondary | ICD-10-CM

## 2022-11-23 DIAGNOSIS — Z885 Allergy status to narcotic agent status: Secondary | ICD-10-CM

## 2022-11-23 DIAGNOSIS — F39 Unspecified mood [affective] disorder: Secondary | ICD-10-CM | POA: Diagnosis present

## 2022-11-23 DIAGNOSIS — T380X5A Adverse effect of glucocorticoids and synthetic analogues, initial encounter: Secondary | ICD-10-CM | POA: Diagnosis not present

## 2022-11-23 DIAGNOSIS — I4510 Unspecified right bundle-branch block: Secondary | ICD-10-CM | POA: Diagnosis present

## 2022-11-23 DIAGNOSIS — I10 Essential (primary) hypertension: Secondary | ICD-10-CM | POA: Diagnosis present

## 2022-11-23 DIAGNOSIS — Z79899 Other long term (current) drug therapy: Secondary | ICD-10-CM

## 2022-11-23 DIAGNOSIS — G4733 Obstructive sleep apnea (adult) (pediatric): Secondary | ICD-10-CM

## 2022-11-23 DIAGNOSIS — Z825 Family history of asthma and other chronic lower respiratory diseases: Secondary | ICD-10-CM

## 2022-11-23 DIAGNOSIS — Z9981 Dependence on supplemental oxygen: Secondary | ICD-10-CM

## 2022-11-23 DIAGNOSIS — Z79620 Long term (current) use of immunosuppressive biologic: Secondary | ICD-10-CM

## 2022-11-23 DIAGNOSIS — E875 Hyperkalemia: Secondary | ICD-10-CM | POA: Diagnosis present

## 2022-11-23 DIAGNOSIS — Z8249 Family history of ischemic heart disease and other diseases of the circulatory system: Secondary | ICD-10-CM

## 2022-11-23 DIAGNOSIS — Z91010 Allergy to peanuts: Secondary | ICD-10-CM

## 2022-11-23 DIAGNOSIS — J4489 Other specified chronic obstructive pulmonary disease: Secondary | ICD-10-CM | POA: Diagnosis present

## 2022-11-23 DIAGNOSIS — E1140 Type 2 diabetes mellitus with diabetic neuropathy, unspecified: Secondary | ICD-10-CM | POA: Diagnosis present

## 2022-11-23 LAB — BASIC METABOLIC PANEL
Anion gap: 15 (ref 5–15)
BUN: 23 mg/dL (ref 8–23)
CO2: 32 mmol/L (ref 22–32)
Calcium: 9.1 mg/dL (ref 8.9–10.3)
Chloride: 91 mmol/L — ABNORMAL LOW (ref 98–111)
Creatinine, Ser: 1.38 mg/dL — ABNORMAL HIGH (ref 0.44–1.00)
GFR, Estimated: 42 mL/min — ABNORMAL LOW (ref >60)
Glucose, Bld: 324 mg/dL — ABNORMAL HIGH (ref 70–99)
Potassium: 3.7 mmol/L (ref 3.5–5.1)
Sodium: 138 mmol/L (ref 135–145)

## 2022-11-23 LAB — CBC
HCT: 40.6 % (ref 36.0–46.0)
Hemoglobin: 13.3 g/dL (ref 12.0–15.0)
MCH: 30.7 pg (ref 26.0–34.0)
MCHC: 32.8 g/dL (ref 30.0–36.0)
MCV: 93.8 fL (ref 80.0–100.0)
Platelets: 319 10*3/uL (ref 150–400)
RBC: 4.33 MIL/uL (ref 3.87–5.11)
RDW: 14 % (ref 11.5–15.5)
WBC: 11.2 10*3/uL — ABNORMAL HIGH (ref 4.0–10.5)
nRBC: 0 % (ref 0.0–0.2)

## 2022-11-23 LAB — CBC WITH DIFFERENTIAL/PLATELET
Basophils Absolute: 0.1 10*3/uL (ref 0.0–0.1)
Basophils Relative: 0.5 % (ref 0.0–3.0)
Eosinophils Absolute: 0.1 10*3/uL (ref 0.0–0.7)
Eosinophils Relative: 1 % (ref 0.0–5.0)
HCT: 41.1 % (ref 36.0–46.0)
Hemoglobin: 13.2 g/dL (ref 12.0–15.0)
Lymphocytes Relative: 26 % (ref 12.0–46.0)
Lymphs Abs: 3.2 10*3/uL (ref 0.7–4.0)
MCHC: 32 g/dL (ref 30.0–36.0)
MCV: 94.2 fl (ref 78.0–100.0)
Monocytes Absolute: 1.2 10*3/uL — ABNORMAL HIGH (ref 0.1–1.0)
Monocytes Relative: 9.9 % (ref 3.0–12.0)
Neutro Abs: 7.6 10*3/uL (ref 1.4–7.7)
Neutrophils Relative %: 62.6 % (ref 43.0–77.0)
Platelets: 322 10*3/uL (ref 150.0–400.0)
RBC: 4.37 Mil/uL (ref 3.87–5.11)
RDW: 14.4 % (ref 11.5–15.5)
WBC: 12.1 10*3/uL — ABNORMAL HIGH (ref 4.0–10.5)

## 2022-11-23 LAB — I-STAT VENOUS BLOOD GAS, ED
Acid-Base Excess: 11 mmol/L — ABNORMAL HIGH (ref 0.0–2.0)
Bicarbonate: 36.9 mmol/L — ABNORMAL HIGH (ref 20.0–28.0)
Calcium, Ion: 1.03 mmol/L — ABNORMAL LOW (ref 1.15–1.40)
HCT: 40 % (ref 36.0–46.0)
Hemoglobin: 13.6 g/dL (ref 12.0–15.0)
O2 Saturation: 93 %
Potassium: 5.2 mmol/L — ABNORMAL HIGH (ref 3.5–5.1)
Sodium: 134 mmol/L — ABNORMAL LOW (ref 135–145)
TCO2: 38 mmol/L — ABNORMAL HIGH (ref 22–32)
pCO2, Ven: 52.7 mmHg (ref 44–60)
pH, Ven: 7.453 — ABNORMAL HIGH (ref 7.25–7.43)
pO2, Ven: 64 mmHg — ABNORMAL HIGH (ref 32–45)

## 2022-11-23 LAB — MAGNESIUM: Magnesium: 1.8 mg/dL (ref 1.7–2.4)

## 2022-11-23 LAB — CBG MONITORING, ED: Glucose-Capillary: 361 mg/dL — ABNORMAL HIGH (ref 70–99)

## 2022-11-23 LAB — RESP PANEL BY RT-PCR (RSV, FLU A&B, COVID)  RVPGX2
Influenza A by PCR: NEGATIVE
Influenza B by PCR: NEGATIVE
Resp Syncytial Virus by PCR: NEGATIVE
SARS Coronavirus 2 by RT PCR: NEGATIVE

## 2022-11-23 LAB — BRAIN NATRIURETIC PEPTIDE: Pro B Natriuretic peptide (BNP): 43 pg/mL (ref 0.0–100.0)

## 2022-11-23 LAB — TROPONIN I (HIGH SENSITIVITY)
Troponin I (High Sensitivity): 28 ng/L — ABNORMAL HIGH (ref <18)
Troponin I (High Sensitivity): 26 ng/L — ABNORMAL HIGH (ref <18)

## 2022-11-23 MED ORDER — ONDANSETRON HCL 4 MG/2ML IJ SOLN: 4.0000 mg | Freq: Four times a day (QID) | INTRAMUSCULAR | Status: DC | PRN

## 2022-11-23 MED ORDER — INSULIN GLARGINE-YFGN 100 UNIT/ML ~~LOC~~ SOLN
55.0000 [IU] | Freq: Two times a day (BID) | SUBCUTANEOUS | Status: DC
  Administered 2022-11-24 (×2): 55 [IU] via SUBCUTANEOUS
  Filled 2022-11-23 (×3): qty 0.55

## 2022-11-23 MED ORDER — METHYLPREDNISOLONE SODIUM SUCC 125 MG IJ SOLR
125.0000 mg | Freq: Once | INTRAMUSCULAR | Status: AC
  Administered 2022-11-23: 125 mg via INTRAVENOUS
  Filled 2022-11-23: qty 2

## 2022-11-23 MED ORDER — DOXYCYCLINE HYCLATE 100 MG PO TABS
100.0000 mg | ORAL_TABLET | Freq: Two times a day (BID) | ORAL | Status: AC
  Administered 2022-11-24 – 2022-11-28 (×9): 100 mg via ORAL
  Filled 2022-11-23 (×9): qty 1

## 2022-11-23 MED ORDER — IPRATROPIUM-ALBUTEROL 0.5-2.5 (3) MG/3ML IN SOLN
3.0000 mL | RESPIRATORY_TRACT | Status: DC
  Administered 2022-11-24 (×7): 3 mL via RESPIRATORY_TRACT
  Filled 2022-11-23 (×6): qty 3

## 2022-11-23 MED ORDER — ACETAMINOPHEN 500 MG PO TABS
1000.0000 mg | ORAL_TABLET | Freq: Four times a day (QID) | ORAL | Status: DC | PRN
  Administered 2022-11-24 – 2022-11-29 (×9): 1000 mg via ORAL
  Filled 2022-11-23 (×9): qty 2

## 2022-11-23 MED ORDER — DOXYCYCLINE HYCLATE 100 MG PO TABS
100.0000 mg | ORAL_TABLET | Freq: Once | ORAL | Status: AC
  Administered 2022-11-23: 100 mg via ORAL
  Filled 2022-11-23: qty 1

## 2022-11-23 MED ORDER — ALBUTEROL SULFATE (2.5 MG/3ML) 0.083% IN NEBU: 2.5000 mg | INHALATION_SOLUTION | RESPIRATORY_TRACT | Status: DC | PRN

## 2022-11-23 MED ORDER — GUAIFENESIN ER 600 MG PO TB12
600.0000 mg | ORAL_TABLET | Freq: Two times a day (BID) | ORAL | Status: DC
  Administered 2022-11-23 – 2022-11-30 (×14): 600 mg via ORAL
  Filled 2022-11-23 (×14): qty 1

## 2022-11-23 MED ORDER — METHYLPREDNISOLONE SODIUM SUCC 125 MG IJ SOLR
60.0000 mg | Freq: Two times a day (BID) | INTRAMUSCULAR | Status: DC
  Administered 2022-11-24: 60 mg via INTRAVENOUS
  Filled 2022-11-23: qty 2

## 2022-11-23 MED ORDER — SODIUM CHLORIDE 0.9 % IV SOLN
1.0000 g | INTRAVENOUS | Status: AC
  Administered 2022-11-24 – 2022-11-27 (×4): 1 g via INTRAVENOUS
  Filled 2022-11-23 (×4): qty 10

## 2022-11-23 MED ORDER — INSULIN ASPART 100 UNIT/ML IJ SOLN
0.0000 [IU] | Freq: Three times a day (TID) | INTRAMUSCULAR | Status: DC
  Administered 2022-11-24 (×2): 20 [IU] via SUBCUTANEOUS
  Administered 2022-11-25 – 2022-11-26 (×4): 15 [IU] via SUBCUTANEOUS
  Administered 2022-11-26 (×2): 11 [IU] via SUBCUTANEOUS
  Administered 2022-11-27: 20 [IU] via SUBCUTANEOUS
  Administered 2022-11-27: 7 [IU] via SUBCUTANEOUS
  Administered 2022-11-27 – 2022-11-28 (×2): 11 [IU] via SUBCUTANEOUS
  Administered 2022-11-28: 7 [IU] via SUBCUTANEOUS
  Administered 2022-11-29: 3 [IU] via SUBCUTANEOUS
  Administered 2022-11-29: 11 [IU] via SUBCUTANEOUS
  Administered 2022-11-30: 3 [IU] via SUBCUTANEOUS

## 2022-11-23 MED ORDER — ALBUTEROL (5 MG/ML) CONTINUOUS INHALATION SOLN
10.0000 mg/h | INHALATION_SOLUTION | Freq: Once | RESPIRATORY_TRACT | Status: AC
  Administered 2022-11-23: 10 mg/h via RESPIRATORY_TRACT
  Filled 2022-11-23: qty 20

## 2022-11-23 MED ORDER — ENOXAPARIN SODIUM 60 MG/0.6ML IJ SOSY
50.0000 mg | PREFILLED_SYRINGE | INTRAMUSCULAR | Status: DC
  Administered 2022-11-24 – 2022-11-29 (×6): 50 mg via SUBCUTANEOUS
  Filled 2022-11-23 (×6): qty 0.6

## 2022-11-23 MED ORDER — SODIUM CHLORIDE 0.9% FLUSH
3.0000 mL | Freq: Two times a day (BID) | INTRAVENOUS | Status: DC
  Administered 2022-11-24 – 2022-11-30 (×12): 3 mL via INTRAVENOUS

## 2022-11-23 MED ORDER — PREDNISONE 20 MG PO TABS
ORAL_TABLET | ORAL | 0 refills | Status: DC
Start: 2022-11-23 — End: 2022-11-24

## 2022-11-23 MED ORDER — INSULIN ASPART 100 UNIT/ML IJ SOLN
0.0000 [IU] | Freq: Every day | INTRAMUSCULAR | Status: DC
  Administered 2022-11-23 – 2022-11-24 (×2): 5 [IU] via SUBCUTANEOUS
  Administered 2022-11-25: 4 [IU] via SUBCUTANEOUS
  Administered 2022-11-26: 3 [IU] via SUBCUTANEOUS
  Administered 2022-11-29: 2 [IU] via SUBCUTANEOUS

## 2022-11-23 MED ORDER — SODIUM CHLORIDE 0.9 % IV SOLN
1.0000 g | Freq: Once | INTRAVENOUS | Status: AC
  Administered 2022-11-23: 1 g via INTRAVENOUS
  Filled 2022-11-23: qty 10

## 2022-11-23 MED ORDER — ROPINIROLE HCL ER 4 MG PO TB24
4.0000 mg | ORAL_TABLET | Freq: Every day | ORAL | Status: DC
  Administered 2022-11-23 – 2022-11-24 (×2): 4 mg via ORAL
  Filled 2022-11-23 (×2): qty 1

## 2022-11-23 NOTE — ED Provider Notes (Signed)
[place] EMERGENCY DEPARTMENT AT [facility] Provider Note   CSN: [id number] Arrival date & time: 11/23/22  1500     History  Chief Complaint  Patient presents with   Shortness of Breath   Chest Pain    Kathleen Hatfield is a 67 y.o. female.   Shortness of Breath Associated symptoms: cough and wheezing   Chest Pain Associated symptoms: cough, fatigue and shortness of breath   Patient presents for chest pain and shortness of breath.  Medical history includes HLD, CHF, HTN, depression, GERD, UC, obesity, CKD, COPD, gout, DM, sleep apnea, RLS.  Over the past week, she has had worsening shortness of breath, cough productive of mucus, chest tightness, wheezing.  She has increased her home O2.  Home COVID test was negative.  She takes Lasix, 60 mg twice daily.  She has metolazone as needed prescribed.  She did take 1 dose yesterday, 5 mg.  She was seen by PCP earlier today.  PCP noted wheezing and rhonchi on lung auscultation.  She had increased work of breathing.  X-ray at PCPs office showed concern for pneumonia and/your pulmonary edema.  She was advised to come to the ED. patient reports that she utilizes breathing treatments at home every 4 hours as needed.  They have not been relieving her symptoms.  She is on 2 L of supplemental oxygen at baseline.  She denies any areas of new pain.     Home Medications Prior to Admission medications   Medication Sig Start Date End Date Taking? Authorizing Provider  acetaminophen (TYLENOL) 500 MG tablet Take 500 mg by mouth every 6 (six) hours as needed for moderate pain.    [provider]  albuterol (VENTOLIN HFA) 108 (90 Base) MCG/ACT inhaler INHALE TWO PUFFS BY MOUTH INTO LUNGS every SIX hours AS NEEDED 03/26/22   Joaquim Nam, MD  Alirocumab (PRALUENT) 150 MG/ML SOAJ Inject 1 mL (150 mg total) into the skin every 14 (fourteen) days. 09/01/22   Joaquim Nam, MD  aspirin EC 81 MG tablet Take 1 tablet (81 mg total) by mouth  daily. Swallow whole. 06/04/21   Tereso Newcomer T, PA-C  BAYER CONTOUR NEXT TEST test strip 1 each by Other route daily. As directed 07/30/15   [provider]  buPROPion (WELLBUTRIN XL) 300 MG 24 hr tablet Take 1 tablet (300 mg total) by mouth every morning. 11/03/22   Joaquim Nam, MD  Cholecalciferol (VITAMIN D-3) 5000 UNITS TABS Take 5,000 Units by mouth every other day.     [provider]  colchicine 0.6 MG tablet TAKE 1 TABLET (0.6 MG TOTAL) BY MOUTH DAILY AS NEEDED (FOR GOUT) 07/20/22   Joaquim Nam, MD  Continuous Blood Gluc Receiver (DEXCOM G7 RECEIVER) DEVI by Does not apply route.    [provider]  Continuous Blood Gluc Sensor (DEXCOM G7 SENSOR) MISC 1 Device by Does not apply route as directed. Apply sensor every 10 days 03/31/22   Shamleffer, Konrad Dolores, MD  dicyclomine (BENTYL) 20 MG tablet TAKE 1 TABLET UP TO FOUR TIMES A DAY FOR GI CRAMPING, PAIN, NAUSEA, AND VOMITING AS NEEDED 03/14/21   Joaquim Nam, MD  DULoxetine (CYMBALTA) 60 MG capsule TAKE ONE CAPSULE BY MOUTH EVERYDAY AT BEDTIME 04/02/22   Joaquim Nam, MD  estradiol (ESTRACE) 0.1 MG/GM vaginal cream Place 1 Applicatorful vaginally 3 (three) times a week.    [provider]  FEROSUL 325 (65 Fe) MG tablet TAKE ONE TABLET  BY MOUTH every other evening 05/01/22   Joaquim Nam, MD  fluticasone [facility]) 50 MCG/ACT nasal spray Place 2 sprays into both nostrils daily. Patient taking differently: Place 2 sprays into both nostrils as needed. 02/08/21   Marita Kansas, PA-C  furosemide (LASIX) 20 MG tablet Take 3 tablets (60 mg total) by mouth 2 (two) times daily. 05/01/22   Tereso Newcomer T, PA-C  gabapentin (NEURONTIN) 300 MG capsule TAKE ONE CAPSULE BY MOUTH EVERYDAY AT BEDTIME May take additional capsule daily if needed 08/03/22   Joaquim Nam, MD  insulin aspart (NOVOLOG FLEXPEN) 100 UNIT/ML FlexPen Max daily 45 units 03/31/22   Shamleffer, Konrad Dolores, MD  insulin degludec  (TRESIBA FLEXTOUCH) 200 UNIT/ML FlexTouch Pen Inject 98 Units into the skin daily. Patient taking differently: Inject 110 Units into the skin daily. 07/09/22   Joaquim Nam, MD  Insulin Pen Needle (GLOBAL EASE INJECT PEN NEEDLES) 32G X 4 MM MISC 1 Device by Other route in the morning, at noon, in the evening, and at bedtime. 03/31/22   Shamleffer, Konrad Dolores, MD  ipratropium-albuterol (DUONEB) 0.5-2.5 (3) MG/3ML SOLN Take 3 mLs by nebulization every 6 (six) hours as needed (wheezing). 02/10/21   Coralyn Helling, MD  ketoconazole (NIZORAL) 2 % cream APPLY TO AFFECTED AREA TWICE A DAY Patient taking differently: as needed. 04/29/22   Joaquim Nam, MD  meloxicam (MOBIC) 15 MG tablet TAKE 1 TABLET (15 MG TOTAL) BY MOUTH DAILY. Patient taking differently: Take 15 mg by mouth as needed. 10/13/22 12/12/22  Standiford, Jenelle Mages, DPM  mesalamine (LIALDA) 1.2 g EC tablet Take 4.8 g by mouth daily.    [provider]  metolazone (ZAROXOLYN) 5 MG tablet TAKE 1 TABLET BY MOUTH EVERY DAY AS NEEDED 12/01/21   Joaquim Nam, MD  MICROLET LANCETS MISC 1 each by Other route. As directed 07/30/15   [provider]  nitrofurantoin (MACRODANTIN) 50 MG capsule Take 50 mg by mouth daily. 10/08/21   [provider]  nystatin (MYCOSTATIN/NYSTOP) powder Apply 1 application topically 3 (three) times daily. Patient taking differently: Apply 1 application  topically as needed. 09/26/20   Joaquim Nam, MD  ondansetron (ZOFRAN) 4 MG tablet TAKE ONE TABLET BY MOUTH every EIGHT hours AS NEEDED FOR NAUSEA AND VOMITING 05/22/22   Joaquim Nam, MD  OXYGEN Inhale 2 L into the lungs continuous.    [provider]  pantoprazole (PROTONIX) 40 MG tablet TAKE ONE TABLET BY MOUTH EVERY MORNING 05/25/22   Joaquim Nam, MD  polyethylene glycol [facility] Ethelene Hal) packet Take 17 g by mouth daily as needed for mild constipation.    [provider]  potassium chloride (MICRO-K) 10 MEQ CR  capsule TAKE 5 CAPSULES BY MOUTH AT NOON 09/21/22   Joaquim Nam, MD  predniSONE (DELTASONE) 20 MG tablet Take two tablets once daily for 5 days 11/23/22   Mort Sawyers, FNP  rOPINIRole (REQUIP) 4 MG tablet TAKE 1 TABLET BY MOUTH EVERYDAY AT BEDTIME 07/09/22   Joaquim Nam, MD  Semaglutide, 2 MG/DOSE, 8 MG/3ML SOPN Inject 2 mg as directed once a week. 07/29/22   Joaquim Nam, MD  simethicone (GAS-X) 80 MG chewable tablet Chew 1 tablet (80 mg total) by mouth every 6 (six) hours as needed for flatulence. 11/28/21   Joaquim Nam, MD  spironolactone (ALDACTONE) 25 MG tablet Take 0.5 tablets (12.5 mg total) by mouth daily. 05/26/22   Joaquim Nam, MD  traMADol Janean Sark)  50 MG tablet Take 1 tablet (50 mg total) by mouth 3 (three) times daily as needed. 11/04/22   Joaquim Nam, MD  UNABLE TO FIND CPAP- At bedtime    [provider]      Allergies    Almond oil, Morphine and codeine, Atorvastatin, Ceclor [cefaclor], Sulfa antibiotics, Ciprofloxacin, Levaquin [levofloxacin], Losartan, Peanut-containing drug products, Primidone, Repatha [evolocumab], and Statins    Review of Systems   Review of Systems  Constitutional:  Positive for activity change and fatigue.  Respiratory:  Positive for cough, chest tightness, shortness of breath and wheezing.   All other systems reviewed and are negative.   Physical Exam Updated Vital Signs BP (!) 140/76   Pulse 87   Temp 98.3 F (36.8 C) (Oral)   Resp (!) 21   Ht 5\' 5"  (1.651 m)   Wt 115 kg   SpO2 100%   BMI 42.19 kg/m  Physical Exam Vitals and nursing note reviewed.  Constitutional:      General: She is not in acute distress.    Appearance: She is well-developed. She is ill-appearing. She is not toxic-appearing or diaphoretic.  HENT:     Head: Normocephalic and atraumatic.     Mouth/Throat:     Mouth: Mucous membranes are moist.  Eyes:     Extraocular Movements: Extraocular movements intact.     Conjunctiva/sclera:  Conjunctivae normal.  Cardiovascular:     Rate and Rhythm: Normal rate and regular rhythm.     Heart sounds: No murmur heard. Pulmonary:     Effort: Tachypnea and accessory muscle usage present.     Breath sounds: Decreased air movement present. Decreased breath sounds and wheezing present.  Chest:     Chest wall: No tenderness.  Abdominal:     Palpations: Abdomen is soft.     Tenderness: There is no abdominal tenderness.  Musculoskeletal:        General: No swelling. Normal range of motion.     Cervical back: Normal range of motion and neck supple.     Right lower leg: Edema present.     Left lower leg: Edema present.  Skin:    General: Skin is warm and dry.     Coloration: Skin is not cyanotic or pale.  Neurological:     General: No focal deficit present.     Mental Status: She is alert and oriented to person, place, and time.  Psychiatric:        Mood and Affect: Mood normal.        Behavior: Behavior normal.     ED Results / Procedures / Treatments   Labs (all labs ordered are listed, but only abnormal results are displayed) Labs Reviewed  BASIC METABOLIC PANEL - Abnormal; Notable for the following components:      Result Value   Chloride 91 (*)    Glucose, Bld 324 (*)    Creatinine, Ser 1.38 (*)    GFR, Estimated 42 (*)    All other components within normal limits  CBC - Abnormal; Notable for the following components:   WBC 11.2 (*)    All other components within normal limits  TROPONIN I (HIGH SENSITIVITY) - Abnormal; Notable for the following components:   Troponin I (High Sensitivity) 28 (*)    All other components within normal limits  TROPONIN I (HIGH SENSITIVITY) - Abnormal; Notable for the following components:   Troponin I (High Sensitivity) 26 (*)    All other components within normal limits  RESP PANEL BY RT-PCR (RSV, FLU A&B, COVID)  [id number]  MAGNESIUM  I-STAT VENOUS BLOOD GAS, ED    EKG EKG Interpretation Date/Time:  Monday November 23 2022  15:54:11 EDT Ventricular Rate:  91 PR Interval:  178 QRS Duration:  136 QT Interval:  438 QTC Calculation: 538 R Axis:   -81  Text Interpretation: Normal sinus rhythm Right bundle branch block Left anterior fascicular block Bifascicular block Abnormal ECG Confirmed by Gloris Manchester [phone]) on 11/23/2022 6:48:16 PM  Radiology DG Chest 2 View  Result Date: 11/23/2022 CLINICAL DATA:  Short of breath and chest pain EXAM: CHEST - 2 VIEW COMPARISON:  11/23/2022 FINDINGS: Frontal and lateral views of the chest demonstrate an unremarkable cardiac silhouette. No acute airspace disease, effusion, or pneumothorax. No acute bony abnormalities. IMPRESSION: 1. No acute intrathoracic process. Electronically Signed   By: Sharlet Salina M.D.   On: 11/23/2022 17:19   DG Chest 2 View  Result Date: 11/23/2022 CLINICAL DATA:  Shortness of breath EXAM: CHEST - 2 VIEW COMPARISON:  09/21/2022 FINDINGS: The heart size and mediastinal contours are stable. Coarsened interstitial markings bilaterally. No new airspace consolidation. No pleural effusion or pneumothorax. The visualized skeletal structures are unremarkable. IMPRESSION: Coarsened interstitial markings bilaterally, which may reflect chronic interstitial lung disease. No new airspace consolidation. Electronically Signed   By: Duanne Guess D.O.   On: 11/23/2022 13:32    Procedures Procedures    Medications Ordered in ED Medications  albuterol (PROVENTIL,VENTOLIN) solution continuous neb (10 mg/hr Nebulization Given 11/23/22 1924)  cefTRIAXone (ROCEPHIN) 1 g in sodium chloride 0.9 % 100 mL IVPB (has no administration in time range)  doxycycline (VIBRA-TABS) tablet 100 mg (has no administration in time range)  ondansetron (ZOFRAN) injection 4 mg (has no administration in time range)  methylPREDNISolone sodium succinate (SOLU-MEDROL) 125 mg/2 mL injection 125 mg (125 mg Intravenous Given 11/23/22 1851)    ED Course/ Medical Decision Making/ A&P                                  Medical Decision Making Amount and/or Complexity of Data Reviewed Labs: ordered. Radiology: ordered.  Risk Prescription drug management.   This patient presents to the ED for concern of shortness of breath, this involves an extensive number of treatment options, and is a complaint that carries with it a high risk of complications and morbidity.  The differential diagnosis includes COPD exacerbation, pneumonia, CHF, PE, valvular dysfunction   Co morbidities that complicate the patient evaluation  HLD, CHF, HTN, depression, GERD, UC, obesity, CKD, COPD, gout, DM, sleep apnea, RLS   Additional history obtained:  Additional history obtained from N/A External records from outside source obtained and reviewed including EMR   Lab Tests:  I Ordered, and personally interpreted labs.  The pertinent results include: Leukocytosis is present.  Hemoglobin is normal.  Creatinine is baseline.  Hyperglycemia is present without evidence of DKA.  Electrolytes are normal.  Troponin is mildly elevated but stable on repeat   Imaging Studies ordered:  I ordered imaging studies including chest x-ray I independently visualized and interpreted imaging which showed no acute findings I agree with the radiologist interpretation   Cardiac Monitoring: / EKG:  The patient was maintained on a cardiac monitor.  I personally viewed and interpreted the cardiac monitored which showed an underlying rhythm of: Sinus rhythm   Problem List / ED Course / Critical interventions / Medication management  Patient presents for worsening cough and shortness of breath over the past week.  Seen by PCP earlier today and sent to the ED for further evaluation.  On arrival, patient has tachypnea, increased work of breathing, and severely diminished breath sounds on lung auscultation.  On review of lab work from earlier today, BNP was normal.  Patient to be treated for COPD exacerbation.  Given increased  sputum production, will give antibiotics as well.  Given her current work of breathing, she would benefit from BiPAP.  This was ordered.  Patient BiPAP but did not tolerate it well.  She was placed on continuous albuterol.  On reassessment, she does have improved work of breathing.  She is now able to speak in complete sentences.  Given the severity of symptoms, patient to be admitted for further management. I ordered medication including Solu-Medrol, albuterol, ceftriaxone, and doxycycline for COPD exacerbation Reevaluation of the patient after these medicines showed that the patient improved I have reviewed the patients home medicines and have made adjustments as needed   Social Determinants of Health:  Has PCP  CRITICAL CARE Performed by: Gloris Manchester   Total critical care time: 32 minutes  Critical care time was exclusive of separately billable procedures and treating other patients.  Critical care was necessary to treat or prevent imminent or life-threatening deterioration.  Critical care was time spent personally by me on the following activities: development of treatment plan with patient and/or surrogate as well as nursing, discussions with consultants, evaluation of patient's response to treatment, examination of patient, obtaining history from patient or surrogate, ordering and performing treatments and interventions, ordering and review of laboratory studies, ordering and review of radiographic studies, pulse oximetry and re-evaluation of patient's condition.         Final Clinical Impression(s) / ED Diagnoses Final diagnoses:  COPD exacerbation [facility])    Rx / DC Orders ED Discharge Orders     None         Gloris Manchester, MD 11/23/22 1946

## 2022-11-23 NOTE — ED Notes (Signed)
Pt states she wears 2L [place] baseline.

## 2022-11-23 NOTE — ED Notes (Signed)
Respiratory called for bipap.

## 2022-11-23 NOTE — Progress Notes (Signed)
Reviewed CXR  Concern for pneumonia vs fluid, due to patient presentation and with concerning co morbidities I have advised pt to go to Er. She states her husband will take her.  Dr. Para March, [place] Child[place]

## 2022-11-23 NOTE — ED Triage Notes (Signed)
Pt to ED POV from home. Pt c/o SOB and CP intermittently x2 weeks. Pt was seen at PCP today and was sent to ED. Pt reports PCP took a chest xray on pt and was sent to r/o pneumonia / possible CHF. Pt has hx of CHF.  Pt reports taking spironolactone at home with no missed doses. Pt denies any weight gain.

## 2022-11-23 NOTE — Progress Notes (Unsigned)
Established Patient Office Visit  Subjective:   Patient ID: Kathleen Hatfield, female    DOB: Dec 11, 1955  Age: 67 y.o. MRN: [id number]  CC:  Chief Complaint  Patient presents with   Acute Visit    Possible pneumonia - reports increased shortness of breath, coughing with yellow mucus production, chest tightness and wheezing x1 week. Having to use more O2. Denies fever, chills, body aches, sick contacts. Home tested for COVID and the test was negative. Has been taking Mucinex DM with minimal relief.    HPI: Kathleen Hatfield is a 67 y.o. female presenting on 11/23/2022 for Acute Visit (Possible pneumonia - reports increased shortness of breath, coughing with yellow mucus production, chest tightness and wheezing x1 week. Having to use more O2. Denies fever, chills, body aches, sick contacts. Home tested for COVID and the test was negative. Has been taking Mucinex DM with minimal relief.)   Coughing with yellow mucous however states improved in the last 12 hours. Feeling chest tightness and wheezing. Using nebulizer but without relief. Worsening sob, on 2L continuous oxygen, but having to increase it to 2-3 L mucinex DM with only mild relief.   Denies fever, chills, body aches.  Tested for covid at home and negative.   On lasix 2 times daily at 60 mg.  Also on potassium 10 meq , five capsules at noon (50 meq) Does have metolazone 5 mg to take prn, took one yesterday.  Dx CHF, COPD 09/21/22 CXR cardiomegaly with interstitial pulmonary opacity   D dimer, bnp cbc diff  Cxr  Cont furosemide 60 mg twice daily  Metolazone 5 mg today  Rx prednisone two tablets twie a day for five days   Wt Readings from Last 3 Encounters:  11/23/22 253 lb 8.5 oz (115 kg)  11/10/22 246 lb 9.6 oz (111.9 kg)  11/04/22 257 lb (116.6 kg)   Temp Readings from Last 3 Encounters:  11/24/22 98.7 F (37.1 C) (Oral)  11/23/22 (!) 97.5 F (36.4 C) (Temporal)  09/21/22 98.5 F (36.9 C) (Temporal)   BP Readings from Last  3 Encounters:  11/24/22 (!) 140/68  11/23/22 (!) 144/68  11/10/22 110/72   Pulse Readings from Last 3 Encounters:  11/24/22 (!) 101  11/23/22 78  11/10/22 92   Oxygen at 95%     ROS: Negative unless specifically indicated above in HPI.   Relevant past medical history reviewed and updated as indicated.   Allergies and medications reviewed and updated.  No current facility-administered medications for this visit.  Current Outpatient Medications:    acetaminophen (TYLENOL) 500 MG tablet, Take 500 mg by mouth every 6 (six) hours as needed for moderate pain., Disp: , Rfl:    albuterol (VENTOLIN HFA) 108 (90 Base) MCG/ACT inhaler, INHALE TWO PUFFS BY MOUTH INTO LUNGS every SIX hours AS NEEDED (Patient taking differently: Inhale 2 puffs into the lungs See admin instructions. Inhale 2 puffs by mouth two to three times a day as needed for shortness of breath or wheezing), Disp: 17 g, Rfl: 4   Alirocumab (PRALUENT) 150 MG/ML SOAJ, Inject 1 mL (150 mg total) into the skin every 14 (fourteen) days., Disp: 6 mL, Rfl: 1   aspirin EC 81 MG tablet, Take 1 tablet (81 mg total) by mouth daily. Swallow whole., Disp: 90 tablet, Rfl: 3   BAYER CONTOUR NEXT TEST test strip, 1 each by Other route daily. As directed, Disp: , Rfl:    buPROPion (WELLBUTRIN XL) 300 MG 24 hr tablet,  Take 1 tablet (300 mg total) by mouth every morning., Disp: 90 tablet, Rfl: 2   Cholecalciferol (VITAMIN D-3) 5000 UNITS TABS, Take 5,000 Units by mouth every other day. , Disp: , Rfl:    colchicine 0.6 MG tablet, TAKE 1 TABLET (0.6 MG TOTAL) BY MOUTH DAILY AS NEEDED (FOR GOUT), Disp: 30 tablet, Rfl: 1   Continuous Blood Gluc Receiver (DEXCOM G7 RECEIVER) DEVI, by Does not apply route., Disp: , Rfl:    Continuous Blood Gluc Sensor (DEXCOM G7 SENSOR) MISC, 1 Device by Does not apply route as directed. Apply sensor every 10 days, Disp: 9 each, Rfl: 3   dicyclomine (BENTYL) 20 MG tablet, TAKE 1 TABLET UP TO FOUR TIMES A DAY FOR GI  CRAMPING, PAIN, NAUSEA, AND VOMITING AS NEEDED, Disp: 60 tablet, Rfl: 1   DULoxetine (CYMBALTA) 60 MG capsule, TAKE ONE CAPSULE BY MOUTH EVERYDAY AT BEDTIME, Disp: 90 capsule, Rfl: 3   estradiol (ESTRACE) 0.1 MG/GM vaginal cream, Place 1 Applicatorful vaginally 3 (three) times a week., Disp: , Rfl:    FEROSUL 325 (65 Fe) MG tablet, TAKE ONE TABLET BY MOUTH every other evening, Disp: 30 tablet, Rfl: 11   fluticasone (FLONASE) 50 MCG/ACT nasal spray, Place 2 sprays into both nostrils daily. (Patient taking differently: Place 2 sprays into both nostrils as needed.), Disp: 9.9 mL, Rfl: 0   furosemide (LASIX) 20 MG tablet, Take 3 tablets (60 mg total) by mouth 2 (two) times daily., Disp: 540 tablet, Rfl: 3   gabapentin (NEURONTIN) 300 MG capsule, TAKE ONE CAPSULE BY MOUTH EVERYDAY AT BEDTIME May take additional capsule daily if needed, Disp: 90 capsule, Rfl: 1   insulin aspart (NOVOLOG FLEXPEN) 100 UNIT/ML FlexPen, Max daily 45 units (Patient taking differently: Inject 1-8 Units into the skin See admin instructions. Take 1-8 units into the skin three times a day before or after meals. Max daily 45 units), Disp: 45 mL, Rfl: 3   insulin degludec (TRESIBA FLEXTOUCH) 200 UNIT/ML FlexTouch Pen, Inject 98 Units into the skin daily. (Patient taking differently: Inject 110 Units into the skin daily.), Disp: , Rfl:    Insulin Pen Needle (GLOBAL EASE INJECT PEN NEEDLES) 32G X 4 MM MISC, 1 Device by Other route in the morning, at noon, in the evening, and at bedtime., Disp: 400 each, Rfl: 3   ipratropium-albuterol (DUONEB) 0.5-2.5 (3) MG/3ML SOLN, Take 3 mLs by nebulization every 6 (six) hours as needed (wheezing)., Disp: 360 mL, Rfl: 6   ketoconazole (NIZORAL) 2 % cream, APPLY TO AFFECTED AREA TWICE A DAY (Patient taking differently: Apply 1 Application topically as needed for irritation.), Disp: 30 g, Rfl: 1   meloxicam (MOBIC) 15 MG tablet, TAKE 1 TABLET (15 MG TOTAL) BY MOUTH DAILY. (Patient taking differently: Take  15 mg by mouth daily as needed for pain.), Disp: 30 tablet, Rfl: 1   mesalamine (LIALDA) 1.2 g EC tablet, Take 2.4 g by mouth in the morning and at bedtime., Disp: , Rfl:    metolazone (ZAROXOLYN) 5 MG tablet, TAKE 1 TABLET BY MOUTH EVERY DAY AS NEEDED, Disp: 30 tablet, Rfl: 0   MICROLET LANCETS MISC, 1 each by Other route. As directed, Disp: , Rfl:    nitrofurantoin (MACRODANTIN) 50 MG capsule, Take 50 mg by mouth at bedtime., Disp: , Rfl:    nystatin (MYCOSTATIN/NYSTOP) powder, Apply 1 application topically 3 (three) times daily. (Patient taking differently: Apply 1 application  topically as needed.), Disp: 60 g, Rfl: 1   ondansetron (ZOFRAN) 4 MG  tablet, TAKE ONE TABLET BY MOUTH every EIGHT hours AS NEEDED FOR NAUSEA AND VOMITING, Disp: 20 tablet, Rfl: 3   OXYGEN, Inhale 2 L into the lungs continuous., Disp: , Rfl:    pantoprazole (PROTONIX) 40 MG tablet, TAKE ONE TABLET BY MOUTH EVERY MORNING, Disp: 90 tablet, Rfl: 1   polyethylene glycol (MIRALAX / GLYCOLAX) packet, Take 17 g by mouth daily as needed for mild constipation., Disp: , Rfl:    potassium chloride (MICRO-K) 10 MEQ CR capsule, TAKE 5 CAPSULES BY MOUTH AT NOON (Patient taking differently: Take 10-20 mEq by mouth See admin instructions. Take 2 capsules by mouth in the morning, 1 capsule at noon, then 2 capsules in the evening), Disp: 150 capsule, Rfl: 1   rOPINIRole (REQUIP) 4 MG tablet, TAKE 1 TABLET BY MOUTH EVERYDAY AT BEDTIME (Patient taking differently: Take 4 mg by mouth every evening.), Disp: , Rfl:    Semaglutide, 2 MG/DOSE, 8 MG/3ML SOPN, Inject 2 mg as directed once a week. (Patient taking differently: Inject 2 mg as directed once a week. Saturday), Disp: 3 mL, Rfl: 5   simethicone (GAS-X) 80 MG chewable tablet, Chew 1 tablet (80 mg total) by mouth every 6 (six) hours as needed for flatulence., Disp: 60 tablet, Rfl: 0   spironolactone (ALDACTONE) 25 MG tablet, Take 0.5 tablets (12.5 mg total) by mouth daily., Disp: 45 tablet,  Rfl: 1   traMADol (ULTRAM) 50 MG tablet, Take 1 tablet (50 mg total) by mouth 3 (three) times daily as needed., Disp: 30 tablet, Rfl: 1   UNABLE TO FIND, CPAP- At bedtime, Disp: , Rfl:   Facility-Administered Medications Ordered in Other Visits:    acetaminophen (TYLENOL) tablet 1,000 mg, 1,000 mg, Oral, Q6H PRN, Dolly Rias, MD   albuterol (PROVENTIL) (2.5 MG/3ML) 0.083% nebulizer solution 2.5 mg, 2.5 mg, Nebulization, Q2H PRN, Dolly Rias, MD   aspirin EC tablet 81 mg, 81 mg, Oral, Daily, Segars, Christiane Ha, MD   buPROPion (WELLBUTRIN XL) 24 hr tablet 300 mg, 300 mg, Oral, q morning, Segars, Christiane Ha, MD   cefTRIAXone (ROCEPHIN) 1 g in sodium chloride 0.9 % 100 mL IVPB, 1 g, Intravenous, Q24H, Segars, Christiane Ha, MD   doxycycline (VIBRA-TABS) tablet 100 mg, 100 mg, Oral, Q12H, Segars, Christiane Ha, MD   DULoxetine (CYMBALTA) DR capsule 60 mg, 60 mg, Oral, QHS, Segars, Christiane Ha, MD   enoxaparin (LOVENOX) injection 50 mg, 50 mg, Subcutaneous, Q24H, Segars, Christiane Ha, MD   ferrous sulfate tablet 325 mg, 325 mg, Oral, QODAY, Segars, Christiane Ha, MD   furosemide (LASIX) tablet 60 mg, 60 mg, Oral, BID, Segars, Christiane Ha, MD   gabapentin (NEURONTIN) capsule 300 mg, 300 mg, Oral, QHS, Segars, Christiane Ha, MD   guaiFENesin (MUCINEX) 12 hr tablet 600 mg, 600 mg, Oral, BID, Segars, Jonathan, MD, 600 mg at 11/23/22 2234   insulin aspart (novoLOG) injection 0-20 Units, 0-20 Units, Subcutaneous, TID WC, Segars, Christiane Ha, MD   insulin aspart (novoLOG) injection 0-5 Units, 0-5 Units, Subcutaneous, QHS, Segars, Jonathan, MD, 5 Units at 11/23/22 2237   insulin glargine-yfgn (SEMGLEE) injection 55 Units, 55 Units, Subcutaneous, BID, Segars, Christiane Ha, MD   ipratropium-albuterol (DUONEB) 0.5-2.5 (3) MG/3ML nebulizer solution 3 mL, 3 mL, Nebulization, Q4H, Segars, Jonathan, MD, 3 mL at 11/24/22 0432   methylPREDNISolone sodium succinate (SOLU-MEDROL) 125 mg/2 mL injection 60 mg, 60 mg, Intravenous, Q12H, Segars,  Jonathan, MD   ondansetron (ZOFRAN) injection 4 mg, 4 mg, Intravenous, Q6H PRN, Gloris Manchester, MD   pantoprazole (PROTONIX) EC tablet 40 mg, 40 mg, Oral, q morning,  Dolly Rias, MD   rOPINIRole (REQUIP XL) 24 hr tablet 4 mg, 4 mg, Oral, QHS, Dixon, Ryan, MD, 4 mg at 11/23/22 2030   rOPINIRole (REQUIP) tablet 4 mg, 4 mg, Oral, QPM, Segars, Christiane Ha, MD   sodium chloride flush (NS) 0.9 % injection 3 mL, 3 mL, Intravenous, Q12H, Segars, Christiane Ha, MD   traMADol Janean Sark) tablet 50 mg, 50 mg, Oral, TID PRN, Dolly Rias, MD  Allergies  Allergen Reactions   Almond Oil Anaphylaxis, Shortness Of Breath and Swelling   Morphine And Codeine Shortness Of Breath and Other (See Comments)    Pt. States while in the hospital it affected her breathing, O2 dropped to the 70's   Primidone Shortness Of Breath        Atorvastatin Other (See Comments)    Leg weakness   Ceclor [Cefaclor] Nausea And Vomiting   Sulfa Antibiotics Nausea And Vomiting   Ciprofloxacin Other (See Comments)    Makes joints and muscles ache   Levaquin [Levofloxacin] Other (See Comments)    Body aches   Losartan Other (See Comments)    Weakness    Peanut-Containing Drug Products     Pt states she thinks it is causing gout flare-ups   Repatha [Evolocumab]     Aches   Statins Other (See Comments)    Leg and body weakness    Objective:   BP (!) 144/68 (BP Location: Left Arm, Patient Position: Sitting, Cuff Size: Large)   Pulse 78   Temp (!) 97.5 F (36.4 C) (Temporal)   SpO2 95% Comment: 2L pulse   Physical Exam Constitutional:      General: She is not in acute distress.    Appearance: Normal appearance. She is normal weight. She is not ill-appearing, toxic-appearing or diaphoretic.  HENT:     Head: Normocephalic.  Cardiovascular:     Rate and Rhythm: Normal rate.  Pulmonary:     Effort: Accessory muscle usage present. No respiratory distress.     Breath sounds: Decreased air movement present. Examination of  the right-upper field reveals wheezing and rhonchi. Examination of the left-upper field reveals wheezing and rhonchi. Examination of the right-middle field reveals wheezing. Examination of the left-middle field reveals wheezing. Examination of the left-lower field reveals wheezing. Wheezing and rhonchi present.  Musculoskeletal:        General: Normal range of motion.     Right lower leg: 3+ Edema present.     Left lower leg: 3+ Edema present.  Neurological:     General: No focal deficit present.     Mental Status: She is alert and oriented to person, place, and time. Mental status is at baseline.  Psychiatric:        Mood and Affect: Mood normal.        Behavior: Behavior normal.        Thought Content: Thought content normal.        Judgment: Judgment normal.     Assessment & Plan:  COPD with hypoxia (HCC) -     DG Chest 2 View; Future  Shortness of breath Assessment & Plan: Pt with poor presentation with multiple co morbidities Ordering bnp cbc cmp, D dimer pending results  Did send in prednisone, however now will not advise to take as going into ER and they will take over plan of care. Cxr stat ordered, reviewed . Suggested ILD however pt on physical exam with accessory muscle usage and auscultation with lung restriction with increased lung sounds that are concerning. Due  to this presentation I have advised pt to go to ER, she states that her husband will take her there now.    Orders: -     DG Chest 2 View; Future -     D-dimer, quantitative -     Brain natriuretic peptide -     CBC with Differential/Platelet     Follow up plan: Return for f/u PCP if no improvement in symptoms.  Mort Sawyers, FNP

## 2022-11-23 NOTE — Telephone Encounter (Signed)
Thank you. Pt is aggreable to going to ER and states her husband will take her.

## 2022-11-23 NOTE — Telephone Encounter (Signed)
Per [name] - call patient to make her aware that her CXR has been reviewed, no acute abnormality but it could be possible that there could be fluid/starts of PNA. Kathleen Hatfield should be evaulated in the ED due to her history.  Spoke with Kathleen Hatfield. She is aware of Tabitha's recommendation. Will route message to Tabitha per her request.

## 2022-11-23 NOTE — Progress Notes (Signed)
FYI:  Pt went to ER, seen and admitted as could not tolerate bipap. Suspected DX copd exacerbation. Started on antbx

## 2022-11-24 ENCOUNTER — Telehealth: Payer: Self-pay

## 2022-11-24 DIAGNOSIS — J9611 Chronic respiratory failure with hypoxia: Secondary | ICD-10-CM | POA: Insufficient documentation

## 2022-11-24 DIAGNOSIS — R0602 Shortness of breath: Secondary | ICD-10-CM | POA: Insufficient documentation

## 2022-11-24 DIAGNOSIS — B348 Other viral infections of unspecified site: Secondary | ICD-10-CM | POA: Insufficient documentation

## 2022-11-24 DIAGNOSIS — J441 Chronic obstructive pulmonary disease with (acute) exacerbation: Secondary | ICD-10-CM

## 2022-11-24 LAB — PHOSPHORUS: Phosphorus: 1.9 mg/dL — ABNORMAL LOW (ref 2.5–4.6)

## 2022-11-24 LAB — RESPIRATORY PANEL BY PCR

## 2022-11-24 LAB — BASIC METABOLIC PANEL
Anion gap: 19 — ABNORMAL HIGH (ref 5–15)
BUN: 18 mg/dL (ref 8–23)
CO2: 23 mmol/L (ref 22–32)
Calcium: 8.9 mg/dL (ref 8.9–10.3)
Chloride: 92 mmol/L — ABNORMAL LOW (ref 98–111)
Creatinine, Ser: 1.47 mg/dL — ABNORMAL HIGH (ref 0.44–1.00)
GFR, Estimated: 39 mL/min — ABNORMAL LOW (ref >60)
Glucose, Bld: 491 mg/dL — ABNORMAL HIGH (ref 70–99)
Potassium: 2.9 mmol/L — ABNORMAL LOW (ref 3.5–5.1)
Sodium: 134 mmol/L — ABNORMAL LOW (ref 135–145)

## 2022-11-24 LAB — CBG MONITORING, ED
Glucose-Capillary: 511 mg/dL (ref 70–99)
Glucose-Capillary: 498 mg/dL — ABNORMAL HIGH (ref 70–99)
Glucose-Capillary: 387 mg/dL — ABNORMAL HIGH (ref 70–99)

## 2022-11-24 LAB — CBC
HCT: 41.5 % (ref 36.0–46.0)
Hemoglobin: 13.6 g/dL (ref 12.0–15.0)
MCH: 30.8 pg (ref 26.0–34.0)
MCHC: 32.8 g/dL (ref 30.0–36.0)
MCV: 94.1 fL (ref 80.0–100.0)
Platelets: 327 10*3/uL (ref 150–400)
RBC: 4.41 MIL/uL (ref 3.87–5.11)
RDW: 14.1 % (ref 11.5–15.5)
WBC: 9.1 10*3/uL (ref 4.0–10.5)
nRBC: 0 % (ref 0.0–0.2)

## 2022-11-24 LAB — GLUCOSE, CAPILLARY
Glucose-Capillary: 352 mg/dL — ABNORMAL HIGH (ref 70–99)
Glucose-Capillary: 351 mg/dL — ABNORMAL HIGH (ref 70–99)

## 2022-11-24 LAB — MAGNESIUM: Magnesium: 1.6 mg/dL — ABNORMAL LOW (ref 1.7–2.4)

## 2022-11-24 LAB — D-DIMER, QUANTITATIVE: D-Dimer, Quant: 0.81 ug{FEU}/mL — ABNORMAL HIGH (ref <0.50)

## 2022-11-24 LAB — HIV ANTIBODY (ROUTINE TESTING W REFLEX): HIV Screen 4th Generation wRfx: NONREACTIVE

## 2022-11-24 MED ORDER — GABAPENTIN 300 MG PO CAPS
300.0000 mg | ORAL_CAPSULE | Freq: Every day | ORAL | Status: DC
  Administered 2022-11-24 – 2022-11-29 (×6): 300 mg via ORAL
  Filled 2022-11-24 (×6): qty 1

## 2022-11-24 MED ORDER — DULOXETINE HCL 60 MG PO CPEP
60.0000 mg | ORAL_CAPSULE | Freq: Every day | ORAL | Status: DC
  Administered 2022-11-24 – 2022-11-29 (×6): 60 mg via ORAL
  Filled 2022-11-24 (×6): qty 1

## 2022-11-24 MED ORDER — ROPINIROLE HCL 1 MG PO TABS
4.0000 mg | ORAL_TABLET | Freq: Every evening | ORAL | Status: DC
  Administered 2022-11-24 – 2022-11-29 (×6): 4 mg via ORAL
  Filled 2022-11-24 (×6): qty 4

## 2022-11-24 MED ORDER — FUROSEMIDE 40 MG PO TABS
60.0000 mg | ORAL_TABLET | Freq: Two times a day (BID) | ORAL | Status: DC
  Administered 2022-11-24 – 2022-11-30 (×13): 60 mg via ORAL
  Filled 2022-11-24: qty 3
  Filled 2022-11-24 (×12): qty 1

## 2022-11-24 MED ORDER — METHYLPREDNISOLONE SODIUM SUCC 40 MG IJ SOLR
40.0000 mg | Freq: Two times a day (BID) | INTRAMUSCULAR | Status: DC
  Administered 2022-11-24 – 2022-11-27 (×6): 40 mg via INTRAVENOUS
  Filled 2022-11-24 (×6): qty 1

## 2022-11-24 MED ORDER — INSULIN ASPART 100 UNIT/ML IJ SOLN
5.0000 [IU] | Freq: Three times a day (TID) | INTRAMUSCULAR | Status: DC
  Administered 2022-11-24 – 2022-11-26 (×7): 5 [IU] via SUBCUTANEOUS

## 2022-11-24 MED ORDER — ASPIRIN 81 MG PO TBEC
81.0000 mg | DELAYED_RELEASE_TABLET | Freq: Every day | ORAL | Status: DC
  Administered 2022-11-24 – 2022-11-30 (×7): 81 mg via ORAL
  Filled 2022-11-24 (×7): qty 1

## 2022-11-24 MED ORDER — POTASSIUM CHLORIDE CRYS ER 20 MEQ PO TBCR
40.0000 meq | EXTENDED_RELEASE_TABLET | Freq: Once | ORAL | Status: AC
  Administered 2022-11-24: 40 meq via ORAL
  Filled 2022-11-24: qty 2

## 2022-11-24 MED ORDER — MAGNESIUM SULFATE 2 GM/50ML IV SOLN
2.0000 g | Freq: Once | INTRAVENOUS | Status: AC
  Administered 2022-11-24: 2 g via INTRAVENOUS
  Filled 2022-11-24: qty 50

## 2022-11-24 MED ORDER — TRAMADOL HCL 50 MG PO TABS
50.0000 mg | ORAL_TABLET | Freq: Three times a day (TID) | ORAL | Status: DC | PRN
  Administered 2022-11-25 – 2022-11-29 (×2): 50 mg via ORAL
  Filled 2022-11-24 (×2): qty 1

## 2022-11-24 MED ORDER — POTASSIUM PHOSPHATES 15 MMOLE/5ML IV SOLN
15.0000 mmol | Freq: Once | INTRAVENOUS | Status: AC
  Administered 2022-11-24: 15 mmol via INTRAVENOUS
  Filled 2022-11-24: qty 5

## 2022-11-24 MED ORDER — INFLUENZA VAC A&B SURF ANT ADJ 0.5 ML IM SUSY
0.5000 mL | PREFILLED_SYRINGE | INTRAMUSCULAR | Status: DC
  Filled 2022-11-24: qty 0.5

## 2022-11-24 MED ORDER — FERROUS SULFATE 325 (65 FE) MG PO TABS
325.0000 mg | ORAL_TABLET | ORAL | Status: DC
  Administered 2022-11-24 – 2022-11-30 (×4): 325 mg via ORAL
  Filled 2022-11-24 (×4): qty 1

## 2022-11-24 MED ORDER — BUPROPION HCL ER (XL) 150 MG PO TB24
300.0000 mg | ORAL_TABLET | Freq: Every morning | ORAL | Status: DC
  Administered 2022-11-24 – 2022-11-30 (×7): 300 mg via ORAL
  Filled 2022-11-24 (×4): qty 2
  Filled 2022-11-24: qty 1
  Filled 2022-11-24 (×2): qty 2

## 2022-11-24 MED ORDER — PANTOPRAZOLE SODIUM 40 MG PO TBEC
40.0000 mg | DELAYED_RELEASE_TABLET | Freq: Every morning | ORAL | Status: DC
  Administered 2022-11-24 – 2022-11-30 (×7): 40 mg via ORAL
  Filled 2022-11-24 (×7): qty 1

## 2022-11-24 MED ORDER — MAGNESIUM OXIDE -MG SUPPLEMENT 400 (240 MG) MG PO TABS
400.0000 mg | ORAL_TABLET | Freq: Two times a day (BID) | ORAL | Status: DC
  Administered 2022-11-24 – 2022-11-30 (×13): 400 mg via ORAL
  Filled 2022-11-24 (×13): qty 1

## 2022-11-24 MED ORDER — INSULIN ASPART 100 UNIT/ML IJ SOLN
20.0000 [IU] | Freq: Once | INTRAMUSCULAR | Status: AC
  Administered 2022-11-24: 20 [IU] via SUBCUTANEOUS

## 2022-11-24 NOTE — Progress Notes (Signed)
Same day note Kathleen Hatfield is a 67 y.o. female with past medical history of COPD, congestive heart failure on oxygen at baseline,  CKD stage III, morbid obesity, sleep apnea on CPAP, diabetes, ulcerative colitis, presented to the hospital with worsening dyspnea cough and productive sputum with difficulty lying flat in bed.  She was taking her inhalers without any relief so she decided come to the hospital.  Initial labs showed WBC elevated at 11.2.  Creatinine at 1.3.  COVID and influenza was negative.  Respiratory viral panel however showed rhinovirus.  Patient was then admitted to the hospital for further evaluation and treatment.  Patient seen and examined at bedside.  Patient was admitted to the hospital for increasing dyspnea cough and productive sputum.  At the time of my evaluation, patient complains of dyspnea even on minimal exertion. Feels  little better than the morning.  Physical examination reveals obese female, in mild respiratory distress, nasal cannula oxygen, decreased breath sounds bilaterally.  Laboratory data and imaging was reviewed  Assessment and Plan.  COPD exacerbation Acute on chronic hypoxic respiratory failure Rhinovirus infection. Patient is on 2 L of oxygen at baseline but initially required BiPAP which was subsequently discontinued.,  Now has been back to 2 L of oxygen.  Received 1 dose of Solu-Medrol in the ED and will be continued for now.  On Rocephin and doxycycline.  Respiratory viral panel has resulted in rhinoviral infection at this time.  Patient feels a little better since the morning but still very dyspneic.  Demand ischemia. Secondary respiratory failure.  EKG with RBBB, no acute ischemic changes, high-sensitivity Trope downtrending 28-26.     Hypokalemia.  Will replace.  Check levels in AM.   Hyponatremia mild Mild.  Will continue to monitor.  Hypomagnesemia.  Will replace.  Check levels in AM.  Hypophosphatemia.  Will replace.  Check levels in  AM.   Diabetes type 2 with hyperglycemia:  Tresiba and sliding scale at home.  Continue Semglee resistant scale sliding scale insulin.  Continue gabapentin for neuropathy.   HFpEF, without acute exacerbation Cardiac MRI in 2021 with preserved EF.  In' 67 with preserved EF.  Continue home Lasix 60 daily.  On as needed midodrine as needed at home.    ASCVD, hyperlipidemia: Continue aspirin 81 mg daily.  On PCSK9 as outpatient.   CKD 3: Creatinine baseline approximately 1.3.  With check levels in AM.   Chronic pain: Continue  tramadol as needed.   Mood disorder: Continue home bupropion, duloxetine   Restless legs: Continue home ropinirole  No Charge  Signed,  Tenny Craw, MD [facility]

## 2022-11-24 NOTE — Progress Notes (Signed)
Likely r/t inflammation infection.  Pt inpatient at current.

## 2022-11-24 NOTE — Telephone Encounter (Signed)
I routed this to the inpatient MD re: D dimer.  I suspect this is related to infection/inflammatory process with COPD exacerbation.  Appreciate input from inpatient team- I didn't add on extra imaging and will defer to inpatient team.

## 2022-11-24 NOTE — Evaluation (Signed)
Physical Therapy Evaluation Patient Details Name: Kathleen Hatfield MRN: [id number] DOB: 05-30-1955 Today's Date: 11/24/2022  History of Present Illness  This 67 y.o. female presented to ED on 11/23/22 with SOB and cough and productive sputum difficulty lying flat in bed.  Respiratory panel showed rhinovirus. Blood sugars also running high likely due to steriod use.  PMH includes:  CKD stage III, chronic diastolic CHF, COPD with asthma, depression, insulin-dependent type 2 diabetes, GERD, hyperlipidemia, hypertension, RLS, OSA on CPAP, ulcerative colitis, peripheral neuropathy, morbid obesity.  Clinical Impression  Patient limited with mobility today moving recliner<>BSC due to significant dyspnea and hypoxia on 2L down to 87%.  She took several minutes to recover.  She was able to move with CGA for safety and performed clothing management and toilet hygiene unaided.  She normally can walk a little in her home with rollator or furniture walk.  Does have to negotiate stairs for entry and to access living area.  She will benefit from skilled PT in the acute setting to maximize mobility and practice stairs when able prior to d/c home.  Recommend HHPT at d/c.       If plan is discharge home, recommend the following: A little help with walking and/or transfers;Help with stairs or ramp for entrance;A lot of help with bathing/dressing/bathroom;Assistance with cooking/housework   Can travel by private vehicle        Equipment Recommendations None recommended by PT  Recommendations for Other Services       Functional Status Assessment Patient has had a recent decline in their functional status and demonstrates the ability to make significant improvements in function in a reasonable and predictable amount of time.     Precautions / Restrictions Precautions Precautions: Fall Precaution Comments: O2 dependent      Mobility  Bed Mobility               General bed mobility comments: in recliner     Transfers Overall transfer level: Needs assistance   Transfers: Sit to/from Stand, Bed to chair/wheelchair/BSC Sit to Stand: Supervision   Step pivot transfers: Contact guard assist       General transfer comment: recliner to BSC only then back to recliner with significant increased WOB, SpO2 down to 87% on 2L O2 and taking 3-4 minutes to recover    Ambulation/Gait               General Gait Details: deferred due to respiratory status  Stairs            Wheelchair Mobility     Tilt Bed    Modified Rankin (Stroke Patients Only)       Balance Overall balance assessment: Needs assistance   Sitting balance-Leahy Scale: Good       Standing balance-Leahy Scale: Fair Standing balance comment: standing to doff then don pants and brief with increased time, no UE support                             Pertinent Vitals/Pain Pain Assessment Pain Assessment: Faces Faces Pain Scale: Hurts even more Pain Location: knees with ambulation Pain Descriptors / Indicators: Aching Pain Intervention(s): Monitored during session, Limited activity within patient's tolerance    Home Living Family/patient expects to be discharged to:: Private residence Living Arrangements: Spouse/significant other Available Help at Discharge: Family;Available 24 hours/day Type of Home: House Home Access: Stairs to enter Entrance Stairs-Rails: None (uses door facings) Entrance Stairs-Number of Steps:  2   Home Layout: Other (Comment) (2 steps to sunken living room) Home Equipment: Rollator (4 wheels);Grab bars - tub/shower;Shower seat - built in;Other (comment) (oxygen)      Prior Function Prior Level of Function : Needs assist             Mobility Comments: walks only limited distances in the home, uses rollator intermittently, completes BADL's ADLs Comments: boyfriend cooks     Extremity/Trunk Assessment   Upper Extremity Assessment Upper Extremity Assessment:  Overall WFL for tasks assessed    Lower Extremity Assessment Lower Extremity Assessment: Generalized weakness       Communication   Communication Communication: No apparent difficulties  Cognition Arousal: Alert Behavior During Therapy: WFL for tasks assessed/performed Overall Cognitive Status: Within Functional Limits for tasks assessed                                          General Comments General comments (skin integrity, edema, etc.): completed toilet hygiene, SpO2 down to 87% on 2L O2 with toileting tasks    Exercises     Assessment/Plan    PT Assessment Patient needs continued PT services  PT Problem List Cardiopulmonary status limiting activity;Decreased activity tolerance;Decreased mobility;Decreased safety awareness       PT Treatment Interventions Stair training;Gait training;Therapeutic activities;Functional mobility training    PT Goals (Current goals can be found in the Care Plan section)  Acute Rehab PT Goals Patient Stated Goal: breathing better PT Goal Formulation: With patient Time For Goal Achievement: 12/08/22 Potential to Achieve Goals: Good    Frequency Min 1X/week     Co-evaluation               AM-PAC PT "6 Clicks" Mobility  Outcome Measure Help needed turning from your back to your side while in a flat bed without using bedrails?: None Help needed moving from lying on your back to sitting on the side of a flat bed without using bedrails?: A Little Help needed moving to and from a bed to a chair (including a wheelchair)?: A Little Help needed standing up from a chair using your arms (e.g., wheelchair or bedside chair)?: A Little Help needed to walk in hospital room?: Total Help needed climbing 3-5 steps with a railing? : Total 6 Click Score: 15    End of Session Equipment Utilized During Treatment: Oxygen Activity Tolerance: Patient limited by fatigue;Treatment limited secondary to medical complications  (Comment) Patient left: in chair;with call bell/phone within reach   PT Visit Diagnosis: Muscle weakness (generalized) (M62.81);Difficulty in walking, not elsewhere classified (R26.2)    Time: 0272-5366 PT Time Calculation (min) (ACUTE ONLY): 27 min   Charges:   PT Evaluation $PT Eval Moderate Complexity: 1 Mod PT Treatments $Therapeutic Activity: 8-22 mins PT General Charges $$ ACUTE PT VISIT: 1 Visit         Sheran Lawless, PT Acute Rehabilitation Services Office:[phone] 11/24/2022   Kathleen Hatfield 11/24/2022, 1:39 PM

## 2022-11-24 NOTE — Hospital Course (Addendum)
Same day note Kathleen Hatfield is a 67 y.o. female with past medical history of COPD, congestive heart failure on oxygen at baseline,  CKD stage III, morbid obesity, sleep apnea on CPAP, diabetes, ulcerative colitis, presented to the hospital with worsening dyspnea cough and productive sputum with difficulty lying flat in bed.  She was taking her inhalers without any relief so she decided come to the hospital.  Initial labs showed WBC elevated at 11.2.  Creatinine at 1.3.  COVID and influenza was negative.  Respiratory viral panel however showed rhinovirus.  Patient was then admitted to the hospital for further evaluation and treatment.  Patient seen and examined at bedside.  Patient was admitted to the hospital for increasing dyspnea cough and productive sputum.  At the time of my evaluation, patient complains of dyspnea even on minimal exertion. Feels  little better than the morning.  Physical examination reveals obese female, in mild respiratory distress, nasal cannula oxygen, decreased breath sounds bilaterally.  Laboratory data and imaging was reviewed  Assessment and Plan.  COPD exacerbation Acute on chronic hypoxic respiratory failure Rhinovirus infection. Patient is on 2 L of oxygen at baseline but initially required BiPAP which was subsequently discontinued.,  Now has been back to 2 L of oxygen.  Received 1 dose of Solu-Medrol in the ED and will be continued for now.  On Rocephin and doxycycline.  Respiratory viral panel has resulted in rhinoviral infection at this time.  Patient feels a little better since the morning but still very dyspneic.  Demand ischemia. Secondary respiratory failure.  EKG with RBBB, no acute ischemic changes, high-sensitivity Trope downtrending 28-26.     Hypokalemia.  Will replace.  Check levels in AM.   Hyponatremia mild Mild.  Will continue to monitor.  Hypomagnesemia.  Will replace.  Check levels in AM.  Hypophosphatemia.  Will replace.  Check levels in  AM.   Diabetes type 2 with hyperglycemia:  Tresiba and sliding scale at home.  Continue Semglee resistant scale sliding scale insulin.  Continue gabapentin for neuropathy.   HFpEF, without acute exacerbation Cardiac MRI in 2021 with preserved EF.  In' 21 with preserved EF.  Continue home Lasix 60 daily.  On as needed midodrine as needed at home.    ASCVD, hyperlipidemia: Continue aspirin 81 mg daily.  On PCSK9 as outpatient.   CKD 3: Creatinine baseline approximately 1.3.  With check levels in AM.   Chronic pain: Continue  tramadol as needed.   Mood disorder: Continue home bupropion, duloxetine   Restless legs: Continue home ropinirole  No Charge  Signed,  Kathleen Craw, MD [facility]

## 2022-11-24 NOTE — Progress Notes (Signed)
Placed patient on CPAP for the night via auto-mode with oxygen set at 2lpm

## 2022-11-24 NOTE — ED Notes (Signed)
Call placed to RT to advise of cpap and resp orders

## 2022-11-24 NOTE — Telephone Encounter (Addendum)
Per chart review tab pt was admitted to [facility] on 11/23/22; sending note to Hayden Pedro FNP. And Dr Para March as PCP.

## 2022-11-24 NOTE — Telephone Encounter (Signed)
Noted. Thanks.

## 2022-11-24 NOTE — Progress Notes (Signed)
Pt refusing CPAP at this time. States she doesn't want to wear it at this time. RT encouraged pt to call RT if she would like to be placed on CPAP. Vitals stable, no increased WOB noted, SPO2 95%.      11/24/22 0742  BiPAP/CPAP/SIPAP  Reason BIPAP/CPAP not in use (S)  Non-compliant (Pt states "she doesnt want to wear Cpap at this time.")  BiPAP/CPAP /SiPAP Vitals  Pulse Rate (!) 108  Resp (!) 21  SpO2 95 %  MEWS Score/Color  MEWS Score 2  MEWS Score Color Yellow

## 2022-11-24 NOTE — Progress Notes (Signed)
Admission Note:  Pt arrived from ED to [address] [place] today, at approximately 1450. Pt was dyspneic on exertion, and was not able to walk from stretcher outside room to bed. So, pt stood and pivoted from stretcher to bed, when it was positioned closely to bed in room. At baseline, pt walks with a walker at home.   Pt is alert and oriented x4, on 2LNC baseline O2, and denied pain, but endorsed anxiety. Pt in NAD at this time, and fall preventions in place.

## 2022-11-24 NOTE — ED Notes (Signed)
ED TO INPATIENT HANDOFF REPORT  ED Nurse Name and Phone #: Percival Spanish [phone]  S Name/Age/Gender Trudee Grip 67 y.o. female Room/Bed: 012C/012C  Code Status   Code Status: Full Code  Home/SNF/Other Home Patient oriented to: self, place, time, and situation Is this baseline? Yes   Triage Complete: Triage complete  Chief Complaint COPD exacerbation (HCC) [J44.1]  Triage Note Pt to ED POV from home. Pt c/o SOB and CP intermittently x2 weeks. Pt was seen at PCP today and was sent to ED. Pt reports PCP took a chest xray on pt and was sent to r/o pneumonia / possible CHF. Pt has hx of CHF.  Pt reports taking spironolactone at home with no missed doses. Pt denies any weight gain.    Allergies Allergies  Allergen Reactions   Almond Oil Anaphylaxis, Shortness Of Breath and Swelling   Morphine And Codeine Shortness Of Breath and Other (See Comments)    Pt. States while in the hospital it affected her breathing, O2 dropped to the 70's   Primidone Shortness Of Breath        Atorvastatin Other (See Comments)    Leg weakness   Ceclor [Cefaclor] Nausea And Vomiting   Sulfa Antibiotics Nausea And Vomiting   Ciprofloxacin Other (See Comments)    Makes joints and muscles ache   Levaquin [Levofloxacin] Other (See Comments)    Body aches   Losartan Other (See Comments)    Weakness    Peanut-Containing Drug Products     Pt states she thinks it is causing gout flare-ups   Repatha [Evolocumab]     Aches   Statins Other (See Comments)    Leg and body weakness    Level of Care/Admitting Diagnosis ED Disposition     ED Disposition  Admit   Condition  --   Comment  Hospital Area: [facility] [[id number]]  Level of Care: Progressive [102]  Admit to Progressive based on following criteria: RESPIRATORY PROBLEMS hypoxemic/hypercapnic respiratory failure that is responsive to NIPPV (BiPAP) or High Flow Nasal Cannula (6-80 lpm). Frequent assessment/intervention, no > Q2 hrs <  Q4 hrs, to maintain oxygenation and pulmonary hygiene.  May admit patient to [facility] or [facility] if equivalent level of care is available:: No  Covid Evaluation: Confirmed COVID Negative  Diagnosis: COPD exacerbation [facility]) [[id number]]  Admitting Physician: Dolly Rias [[id number]]  Attending Physician: Dolly Rias [[id number]]  Certification:: I certify this patient will need inpatient services for at least 2 midnights          B Medical/Surgery History Past Medical History:  Diagnosis Date   Anemia    Arthritis    Asthma    Chronic diastolic CHF 05/2014   Echocardiogram 05/2019: EF 70, no RWMA, mild LVH, Gr 1 DD, normal RVSF, severe LVH, borderline asc Aorta (39 mm)   CKD (chronic kidney disease)    Depression    Diabetes mellitus without complication (HCC)    Diverticulitis    Dyspnea    with exertion   GERD (gastroesophageal reflux disease)    History of blood transfusion    Hyperlipidemia    cannot tolerate statins   Hypertension    patient states she has never had high blood pressure.    Nuclear stress test    Myoview 05/2019: EF 83, no ischemia or infarction; Low Risk   Peripheral neuropathy    Pneumonia    PONV (postoperative nausea and vomiting)    RLS (restless legs syndrome)  Sleep apnea    uses CPAP   Ulcerative colitis (HCC)    dr [address]   Past Surgical History:  Procedure Laterality Date   ABDOMINAL HYSTERECTOMY     APPENDECTOMY     BIOPSY  10/10/2021   Procedure: BIOPSY;  Surgeon: Jeani Hawking, MD;  Location: [facility];  Service: Gastroenterology;;   BREAST BIOPSY Left    BREAST SURGERY     left biopsy   CARDIAC CATHETERIZATION     CESAREAN SECTION     CHOLECYSTECTOMY     COLONOSCOPY WITH PROPOFOL N/A 10/10/2021   Procedure: COLONOSCOPY WITH PROPOFOL;  Surgeon: Jeani Hawking, MD;  Location: [facility];  Service: Gastroenterology;  Laterality: N/A;   HERNIA REPAIR     INSERTION OF MESH N/A 11/15/2015   Procedure: INSERTION OF MESH;   Surgeon: Karie Soda, MD;  Location: [facility];  Service: General;  Laterality: N/A;   LAPAROSCOPIC LYSIS OF ADHESIONS N/A 11/15/2015   Procedure: LAPAROSCOPIC LYSIS OF ADHESIONS;  Surgeon: Karie Soda, MD;  Location: [facility];  Service: General;  Laterality: N/A;   RIGHT HEART CATHETERIZATION N/A 02/27/2013   Procedure: RIGHT HEART CATH;  Surgeon: Micheline Chapman, MD;  Location: [facility] CATH LAB;  Service: Cardiovascular;  Laterality: N/A;   RIGHT HEART CATHETERIZATION N/A 12/14/2013   Procedure: RIGHT HEART CATH;  Surgeon: Laurey Morale, MD;  Location: [facility] CATH LAB;  Service: Cardiovascular;  Laterality: N/A;   SIGMOIDECTOMY  2010   diverticular disease   VENTRAL HERNIA REPAIR N/A 11/15/2015   Procedure: LAPAROSCOPIC VENTRAL WALL HERNIA REPAIR;  Surgeon: Karie Soda, MD;  Location: [facility];  Service: General;  Laterality: N/A;     A IV Location/Drains/Wounds Patient Lines/Drains/Airways Status     Active Line/Drains/Airways     Name Placement date Placement time Site Days   Peripheral IV 11/23/22 20 G Left Antecubital 11/23/22  1848  Antecubital  1            Intake/Output Last 24 hours  Intake/Output Summary (Last 24 hours) at 11/24/2022 1336 Last data filed at 11/24/2022 1325 Gross per 24 hour  Intake 114.6 ml  Output --  Net 114.6 ml    Labs/Imaging Results for orders placed or performed during the hospital encounter of 11/23/22 (from the past 48 hour(s))  Basic metabolic panel     Status: Abnormal   Collection Time: 11/23/22  4:00 PM  Result Value Ref Range   Sodium 138 135 - 145 mmol/L   Potassium 3.7 3.5 - 5.1 mmol/L   Chloride 91 (L) 98 - 111 mmol/L   CO2 32 22 - 32 mmol/L   Glucose, Bld 324 (H) 70 - 99 mg/dL    Comment: Glucose reference range applies only to samples taken after fasting for at least 8 hours.   BUN 23 8 - 23 mg/dL   Creatinine, Ser 2.22 (H) 0.44 - 1.00 mg/dL   Calcium 9.1 8.9 - 97.9 mg/dL   GFR, Estimated 42 (L) >60 mL/min    Comment:  (NOTE) Calculated using the CKD-EPI Creatinine Equation (2021)    Anion gap 15 5 - 15    Comment: Performed at [facility], [address] [place], [place] [address]  CBC     Status: Abnormal   Collection Time: 11/23/22  4:00 PM  Result Value Ref Range   WBC 11.2 (H) 4.0 - 10.5 K/uL   RBC 4.33 3.87 - 5.11 MIL/uL   Hemoglobin 13.3 12.0 - 15.0 g/dL   HCT 40.6  36.0 - 46.0 %   MCV 93.8 80.0 - 100.0 fL   MCH 30.7 26.0 - 34.0 pg   MCHC 32.8 30.0 - 36.0 g/dL   RDW 16.1 09.6 - 04.5 %   Platelets 319 150 - 400 K/uL   nRBC 0.0 0.0 - 0.2 %    Comment: Performed at [facility], [address] [place], [place] [address]  Troponin I (High Sensitivity)     Status: Abnormal   Collection Time: 11/23/22  4:00 PM  Result Value Ref Range   Troponin I (High Sensitivity) 28 (H) <18 ng/L    Comment: (NOTE) Elevated high sensitivity troponin I (hsTnI) values and significant  changes across serial measurements may suggest ACS but many other  chronic and acute conditions are known to elevate hsTnI results.  Refer to the "Links" section for chest pain algorithms and additional  guidance. Performed at [facility], [address], [place], [place] [address]   Troponin I (High Sensitivity)     Status: Abnormal   Collection Time: 11/23/22  6:30 PM  Result Value Ref Range   Troponin I (High Sensitivity) 26 (H) <18 ng/L    Comment: (NOTE) Elevated high sensitivity troponin I (hsTnI) values and significant  changes across serial measurements may suggest ACS but many other  chronic and acute conditions are known to elevate hsTnI results.  Refer to the "Links" section for chest pain algorithms and additional  guidance. Performed at [facility], [address] [place], [place] [address]   Resp panel by RT-PCR (RSV, Flu A&B, Covid) Anterior Nasal Swab     Status: None   Collection Time: 11/23/22  7:27 PM   Specimen: Anterior Nasal Swab  Result Value Ref Range   SARS  Coronavirus 2 by RT PCR NEGATIVE NEGATIVE   Influenza A by PCR NEGATIVE NEGATIVE   Influenza B by PCR NEGATIVE NEGATIVE    Comment: (NOTE) The Xpert Xpress SARS-CoV-2/FLU/RSV plus assay is intended as an aid in the diagnosis of influenza from Nasopharyngeal swab specimens and should not be used as a sole basis for treatment. Nasal washings and aspirates are unacceptable for Xpert Xpress SARS-CoV-2/FLU/RSV testing.  Fact Sheet for Patients: BloggerCourse.com  Fact Sheet for Healthcare Providers: SeriousBroker.it  This test is not yet approved or cleared by the [place] FDA and has been authorized for detection and/or diagnosis of SARS-CoV-2 by FDA under an Emergency Use Authorization (EUA). This EUA will remain in effect (meaning this test can be used) for the duration of the COVID-19 declaration under Section 564(b)(1) of the Act, 21 U.S.C. section 360bbb-3(b)(1), unless the authorization is terminated or revoked.     Resp Syncytial Virus by PCR NEGATIVE NEGATIVE    Comment: (NOTE) Fact Sheet for Patients: BloggerCourse.com  Fact Sheet for Healthcare Providers: SeriousBroker.it  This test is not yet approved or cleared by the [place] FDA and has been authorized for detection and/or diagnosis of SARS-CoV-2 by FDA under an Emergency Use Authorization (EUA). This EUA will remain in effect (meaning this test can be used) for the duration of the COVID-19 declaration under Section 564(b)(1) of the Act, 21 U.S.C. section 360bbb-3(b)(1), unless the authorization is terminated or revoked.  Performed at [facility], [address] [place], [place] [address]   I-Stat venous blood gas, ED     Status: Abnormal   Collection Time: 11/23/22  7:37 PM  Result Value Ref Range   pH, Ven 7.453 (H) 7.25 -  7.43   pCO2, Ven 52.7 44 - 60 mmHg   pO2, Ven 64 (H) 32 - 45 mmHg    Bicarbonate 36.9 (H) 20.0 - 28.0 mmol/L   TCO2 38 (H) 22 - 32 mmol/L   O2 Saturation 93 %   Acid-Base Excess 11.0 (H) 0.0 - 2.0 mmol/L   Sodium 134 (L) 135 - 145 mmol/L   Potassium 5.2 (H) 3.5 - 5.1 mmol/L   Calcium, Ion 1.03 (L) 1.15 - 1.40 mmol/L   HCT 40.0 36.0 - 46.0 %   Hemoglobin 13.6 12.0 - 15.0 g/dL   Sample type VENOUS   Magnesium     Status: None   Collection Time: 11/23/22  8:10 PM  Result Value Ref Range   Magnesium 1.8 1.7 - 2.4 mg/dL    Comment: Performed at [facility], [address], [place], [place] [address]  CBG monitoring, ED     Status: Abnormal   Collection Time: 11/23/22 10:31 PM  Result Value Ref Range   Glucose-Capillary 361 (H) 70 - 99 mg/dL    Comment: Glucose reference range applies only to samples taken after fasting for at least 8 hours.  HIV Antibody (routine testing w rflx)     Status: None   Collection Time: 11/24/22  2:37 AM  Result Value Ref Range   HIV Screen 4th Generation wRfx Non Reactive Non Reactive    Comment: Performed at [facility], [address] [place], [place] [address]  Respiratory (~20 pathogens) panel by PCR     Status: Abnormal   Collection Time: 11/24/22  2:37 AM   Specimen: Nasopharyngeal Swab; Respiratory  Result Value Ref Range   Adenovirus NOT DETECTED NOT DETECTED   Coronavirus 229E NOT DETECTED NOT DETECTED    Comment: (NOTE) The Coronavirus on the Respiratory Panel, DOES NOT test for the novel  Coronavirus (2019 nCoV)    Coronavirus HKU1 NOT DETECTED NOT DETECTED   Coronavirus NL63 NOT DETECTED NOT DETECTED   Coronavirus OC43 NOT DETECTED NOT DETECTED   Metapneumovirus NOT DETECTED NOT DETECTED   Rhinovirus / Enterovirus DETECTED (A) NOT DETECTED   Influenza A NOT DETECTED NOT DETECTED   Influenza B NOT DETECTED NOT DETECTED   Parainfluenza Virus 1 NOT DETECTED NOT DETECTED   Parainfluenza Virus 2 NOT DETECTED NOT DETECTED   Parainfluenza Virus 3 NOT DETECTED NOT DETECTED   Parainfluenza Virus 4  NOT DETECTED NOT DETECTED   Respiratory Syncytial Virus NOT DETECTED NOT DETECTED   Bordetella pertussis NOT DETECTED NOT DETECTED   Bordetella Parapertussis NOT DETECTED NOT DETECTED   Chlamydophila pneumoniae NOT DETECTED NOT DETECTED   Mycoplasma pneumoniae NOT DETECTED NOT DETECTED    Comment: Performed at [facility], [address] [place], [place] [address]  CBC     Status: None   Collection Time: 11/24/22  2:37 AM  Result Value Ref Range   WBC 9.1 4.0 - 10.5 K/uL   RBC 4.41 3.87 - 5.11 MIL/uL   Hemoglobin 13.6 12.0 - 15.0 g/dL   HCT 91.4 78.2 - 95.6 %   MCV 94.1 80.0 - 100.0 fL   MCH 30.8 26.0 - 34.0 pg   MCHC 32.8 30.0 - 36.0 g/dL   RDW 21.3 08.6 - 57.8 %   Platelets 327 150 - 400 K/uL   nRBC 0.0 0.0 - 0.2 %    Comment: Performed at [facility], [address] [place], [place] [address]  Basic metabolic panel  Status: Abnormal   Collection Time: 11/24/22  3:40 AM  Result Value Ref Range   Sodium 134 (L) 135 - 145 mmol/L   Potassium 2.9 (L) 3.5 - 5.1 mmol/L   Chloride 92 (L) 98 - 111 mmol/L   CO2 23 22 - 32 mmol/L   Glucose, Bld 491 (H) 70 - 99 mg/dL    Comment: Glucose reference range applies only to samples taken after fasting for at least 8 hours.   BUN 18 8 - 23 mg/dL   Creatinine, Ser 6.04 (H) 0.44 - 1.00 mg/dL   Calcium 8.9 8.9 - 54.0 mg/dL   GFR, Estimated 39 (L) >60 mL/min    Comment: (NOTE) Calculated using the CKD-EPI Creatinine Equation (2021)    Anion gap 19 (H) 5 - 15    Comment: Performed at [facility], [address], [place], [place] [address]  Magnesium     Status: Abnormal   Collection Time: 11/24/22  3:40 AM  Result Value Ref Range   Magnesium 1.6 (L) 1.7 - 2.4 mg/dL    Comment: Performed at [facility], [address] [place], [place] [address]  Phosphorus     Status: Abnormal   Collection Time: 11/24/22  3:40 AM  Result Value Ref Range   Phosphorus 1.9 (L) 2.5 - 4.6 mg/dL    Comment: Performed at [facility], [address] [place], [place] [address]  CBG monitoring, ED     Status: Abnormal   Collection Time: 11/24/22  8:20 AM  Result Value Ref Range   Glucose-Capillary 511 (HH) 70 - 99 mg/dL    Comment: Glucose reference range applies only to samples taken after fasting for at least 8 hours.   Comment 1 Notify RN   CBG monitoring, ED     Status: Abnormal   Collection Time: 11/24/22 10:25 AM  Result Value Ref Range   Glucose-Capillary 498 (H) 70 - 99 mg/dL    Comment: Glucose reference range applies only to samples taken after fasting for at least 8 hours.  CBG monitoring, ED     Status: Abnormal   Collection Time: 11/24/22 12:17 PM  Result Value Ref Range   Glucose-Capillary 387 (H) 70 - 99 mg/dL    Comment: Glucose reference range applies only to samples taken after fasting for at least 8 hours.   DG Chest 2 View  Result Date: 11/23/2022 CLINICAL DATA:  Short of breath and chest pain EXAM: CHEST - 2 VIEW COMPARISON:  11/23/2022 FINDINGS: Frontal and lateral views of the chest demonstrate an unremarkable cardiac silhouette. No acute airspace disease, effusion, or pneumothorax. No acute bony abnormalities. IMPRESSION: 1. No acute intrathoracic process. Electronically Signed   By: Sharlet Salina M.D.   On: 11/23/2022 17:19   DG Chest 2 View  Result Date: 11/23/2022 CLINICAL DATA:  Shortness of breath EXAM: CHEST - 2 VIEW COMPARISON:  09/21/2022 FINDINGS: The heart size and mediastinal contours are stable. Coarsened interstitial markings bilaterally. No new airspace consolidation. No pleural effusion or pneumothorax. The visualized skeletal structures are unremarkable. IMPRESSION: Coarsened interstitial markings bilaterally, which may reflect chronic interstitial lung disease. No new airspace consolidation. Electronically Signed   By: Duanne Guess D.O.   On: 11/23/2022 13:32    Pending Labs Unresulted Labs (From admission, onward)     Start     Ordered   11/25/22 0500  Basic  metabolic panel  Tomorrow morning,   R  11/24/22 1324   11/25/22 0500  CBC  Tomorrow morning,   R        11/24/22 1324   11/25/22 0500  Magnesium  Tomorrow morning,   R        11/24/22 1324   11/23/22 2118  Expectorated Sputum Assessment w Gram Stain, Rflx to Resp Cult  (COPD / Pneumonia / Cellulitis / Lower Extremity Wound)  Once,   R        11/23/22 2123            Vitals/Pain Today's Vitals   11/24/22 0930 11/24/22 1215 11/24/22 1215 11/24/22 1300  BP: (!) 133/50 127/80  (!) 148/81  Pulse: 98 96  (!) 101  Resp: (!) 22 18  (!) 24  Temp: 98.7 F (37.1 C)  98 F (36.7 C)   TempSrc: Oral  Oral   SpO2: 100% 94%  97%  Weight:      Height:      PainSc:        Isolation Precautions Droplet precaution  Medications Medications  ondansetron (ZOFRAN) injection 4 mg (has no administration in time range)  rOPINIRole (REQUIP XL) 24 hr tablet 4 mg (4 mg Oral Given 11/23/22 2030)  cefTRIAXone (ROCEPHIN) 1 g in sodium chloride 0.9 % 100 mL IVPB (has no administration in time range)  doxycycline (VIBRA-TABS) tablet 100 mg (100 mg Oral Given 11/24/22 0858)  methylPREDNISolone sodium succinate (SOLU-MEDROL) 125 mg/2 mL injection 60 mg (60 mg Intravenous Given 11/24/22 0857)  ipratropium-albuterol (DUONEB) 0.5-2.5 (3) MG/3ML nebulizer solution 3 mL (3 mLs Nebulization Given 11/24/22 1222)  albuterol (PROVENTIL) (2.5 MG/3ML) 0.083% nebulizer solution 2.5 mg (has no administration in time range)  enoxaparin (LOVENOX) injection 50 mg (50 mg Subcutaneous Given 11/24/22 0906)  sodium chloride flush (NS) 0.9 % injection 3 mL (3 mLs Intravenous Not Given 11/24/22 1326)  acetaminophen (TYLENOL) tablet 1,000 mg (1,000 mg Oral Given 11/24/22 0923)  guaiFENesin (MUCINEX) 12 hr tablet 600 mg (600 mg Oral Given 11/24/22 0858)  insulin glargine-yfgn (SEMGLEE) injection 55 Units (55 Units Subcutaneous Given 11/24/22 1021)  insulin aspart (novoLOG) injection 0-20 Units (20 Units Subcutaneous Given 11/24/22  1220)  insulin aspart (novoLOG) injection 0-5 Units (5 Units Subcutaneous Given 11/23/22 2237)  aspirin EC tablet 81 mg (81 mg Oral Given 11/24/22 0859)  furosemide (LASIX) tablet 60 mg (60 mg Oral Given 11/24/22 0857)  buPROPion (WELLBUTRIN XL) 24 hr tablet 300 mg (300 mg Oral Given 11/24/22 1021)  DULoxetine (CYMBALTA) DR capsule 60 mg (has no administration in time range)  pantoprazole (PROTONIX) EC tablet 40 mg (40 mg Oral Given 11/24/22 0858)  ferrous sulfate tablet 325 mg (325 mg Oral Given 11/24/22 0859)  gabapentin (NEURONTIN) capsule 300 mg (has no administration in time range)  rOPINIRole (REQUIP) tablet 4 mg (has no administration in time range)  traMADol (ULTRAM) tablet 50 mg (has no administration in time range)  potassium PHOSPHATE 15 mmol in dextrose 5 % 250 mL infusion ( Intravenous Restarted 11/24/22 1325)  magnesium oxide (MAG-OX) tablet 400 mg (400 mg Oral Given 11/24/22 0858)  insulin aspart (novoLOG) injection 5 Units (5 Units Subcutaneous Given 11/24/22 1221)  influenza vaccine adjuvanted (FLUAD) injection 0.5 mL (has no administration in time range)  methylPREDNISolone sodium succinate (SOLU-MEDROL) 125 mg/2 mL injection 125 mg (125 mg Intravenous Given 11/23/22 1851)  albuterol (PROVENTIL,VENTOLIN) solution continuous neb (10 mg/hr Nebulization Given 11/23/22 1924)  cefTRIAXone (ROCEPHIN) 1 g in sodium chloride 0.9 % 100 mL IVPB (0 g Intravenous Stopped  11/23/22 2032)  doxycycline (VIBRA-TABS) tablet 100 mg (100 mg Oral Given 11/23/22 1953)  potassium chloride SA (KLOR-CON M) CR tablet 40 mEq (40 mEq Oral Given 11/24/22 0859)  magnesium sulfate IVPB 2 g 50 mL (0 g Intravenous Stopped 11/24/22 1019)  insulin aspart (novoLOG) injection 20 Units (20 Units Subcutaneous Given 11/24/22 0924)    Mobility walks with device     Focused Assessments Pulmonary Assessment Handoff:  Lung sounds: Bilateral Breath Sounds: Diminished L Breath Sounds: Diminished R Breath Sounds: Diminished O2  Device: Nasal Cannula O2 Flow Rate (L/min): 2 L/min    R Recommendations: See Admitting Provider Note  Report given to:   Additional Notes:

## 2022-11-24 NOTE — Telephone Encounter (Signed)
Noted  

## 2022-11-24 NOTE — Assessment & Plan Note (Signed)
Pt with poor presentation with multiple co morbidities Ordering bnp cbc cmp, D dimer pending results  Did send in prednisone, however now will not advise to take as going into ER and they will take over plan of care. Cxr stat ordered, reviewed . Suggested ILD however pt on physical exam with accessory muscle usage and auscultation with lung restriction with increased lung sounds that are concerning. Due to this presentation I have advised pt to go to ER, she states that her husband will take her there now.

## 2022-11-24 NOTE — H&P (Signed)
History and Physical    RAHF ROSDAHL NWG:[id number] DOB: [birth year] DOA: 11/23/2022  PCP: Joaquim Nam, MD   Patient coming from: Home   Chief Complaint:  Chief Complaint  Patient presents with   Shortness of Breath   Chest Pain    HPI:  Kathleen Hatfield is a 67 y.o. 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  At baseline she is severely limited unable to walk between rooms on flat ground in her house without stopping to catch her breath.  Now she is short of breath at rest over the past few days.  He is taken her controller inhalers and nebulizers as prescribed without any relief.  She has been compliant with her home diuretic medicine.  No sick contacts   Review of Systems:  ROS complete and negative except as marked above   Allergies  Allergen Reactions   Almond Oil Anaphylaxis, Shortness Of Breath and Swelling   Morphine And Codeine Shortness Of Breath and Other (See Comments)    Pt. States while in the hospital it affected her breathing, O2 dropped to the 70's   Primidone Shortness Of Breath        Atorvastatin Other (See Comments)    Leg weakness   Ceclor [Cefaclor] Nausea And Vomiting   Sulfa Antibiotics Nausea And Vomiting   Ciprofloxacin Other (See Comments)    Makes joints and muscles ache   Levaquin [Levofloxacin] Other (See Comments)    Body aches   Losartan Other (See Comments)    Weakness    Peanut-Containing Drug Products     Pt states she thinks it is causing gout flare-ups   Repatha [Evolocumab]     Aches   Statins Other (See Comments)    Leg and body weakness    Prior to Admission medications   Medication Sig Start Date End Date Taking? Authorizing Provider  acetaminophen (TYLENOL) 500 MG tablet Take 500 mg by mouth every 6 (six) hours as needed for  moderate pain.   Yes [provider]  albuterol (VENTOLIN HFA) 108 (90 Base) MCG/ACT inhaler INHALE TWO PUFFS BY MOUTH INTO LUNGS every SIX hours AS NEEDED Patient taking differently: Inhale 2 puffs into the lungs See admin instructions. Inhale 2 puffs by mouth two to three times a day as needed for shortness of breath or wheezing 03/26/22  Yes Joaquim Nam, MD  Alirocumab (PRALUENT) 150 MG/ML SOAJ Inject 1 mL (150 mg total) into the skin every 14 (fourteen) days. 09/01/22  Yes Joaquim Nam, MD  aspirin EC 81 MG tablet Take 1 tablet (81 mg total) by mouth daily. Swallow whole. 06/04/21  Yes Tereso Newcomer T, PA-C  buPROPion (WELLBUTRIN XL) 300 MG 24 hr tablet Take 1 tablet (300 mg total) by mouth every morning. 11/03/22  Yes Joaquim Nam, MD  Cholecalciferol (VITAMIN D-3) 5000 UNITS TABS Take 5,000 Units by mouth every other day.    Yes [provider]  colchicine 0.6 MG tablet TAKE 1 TABLET (0.6 MG TOTAL) BY MOUTH DAILY AS NEEDED (FOR GOUT) 07/20/22  Yes Joaquim Nam, MD  dicyclomine (BENTYL) 20 MG tablet TAKE 1 TABLET UP TO FOUR TIMES A DAY FOR GI CRAMPING, PAIN, NAUSEA, AND VOMITING AS NEEDED 03/14/21  Yes Joaquim Nam, MD  DULoxetine (CYMBALTA) 60 MG capsule  TAKE ONE CAPSULE BY MOUTH EVERYDAY AT BEDTIME 04/02/22  Yes Joaquim Nam, MD  estradiol (ESTRACE) 0.1 MG/GM vaginal cream Place 1 Applicatorful vaginally 3 (three) times a week.   Yes [provider]  FEROSUL 325 (65 Fe) MG tablet TAKE ONE TABLET BY MOUTH every other evening 05/01/22  Yes Joaquim Nam, MD  fluticasone [facility]) 50 MCG/ACT nasal spray Place 2 sprays into both nostrils daily. Patient taking differently: Place 2 sprays into both nostrils as needed. 02/08/21  Yes Ali, Amjad, PA-C  furosemide (LASIX) 20 MG tablet Take 3 tablets (60 mg total) by mouth 2 (two) times daily. 05/01/22  Yes Weaver, Scott T, PA-C  gabapentin (NEURONTIN) 300 MG capsule TAKE ONE CAPSULE BY MOUTH EVERYDAY AT BEDTIME  May take additional capsule daily if needed 08/03/22  Yes Joaquim Nam, MD  insulin aspart (NOVOLOG FLEXPEN) 100 UNIT/ML FlexPen Max daily 45 units Patient taking differently: Inject 1-8 Units into the skin See admin instructions. Take 1-8 units into the skin three times a day before or after meals. Max daily 45 units 03/31/22  Yes Shamleffer, Konrad Dolores, MD  insulin degludec (TRESIBA FLEXTOUCH) 200 UNIT/ML FlexTouch Pen Inject 98 Units into the skin daily. Patient taking differently: Inject 110 Units into the skin daily. 07/09/22  Yes Joaquim Nam, MD  ipratropium-albuterol (DUONEB) 0.5-2.5 (3) MG/3ML SOLN Take 3 mLs by nebulization every 6 (six) hours as needed (wheezing). 02/10/21  Yes Coralyn Helling, MD  ketoconazole (NIZORAL) 2 % cream APPLY TO AFFECTED AREA TWICE A DAY Patient taking differently: Apply 1 Application topically as needed for irritation. 04/29/22  Yes Joaquim Nam, MD  meloxicam (MOBIC) 15 MG tablet TAKE 1 TABLET (15 MG TOTAL) BY MOUTH DAILY. Patient taking differently: Take 15 mg by mouth daily as needed for pain. 10/13/22 12/12/22 Yes Standiford, Jenelle Mages, DPM  mesalamine (LIALDA) 1.2 g EC tablet Take 2.4 g by mouth in the morning and at bedtime.   Yes [provider]  metolazone (ZAROXOLYN) 5 MG tablet TAKE 1 TABLET BY MOUTH EVERY DAY AS NEEDED 12/01/21  Yes Joaquim Nam, MD  nitrofurantoin (MACRODANTIN) 50 MG capsule Take 50 mg by mouth at bedtime. 10/08/21  Yes [provider]  nystatin (MYCOSTATIN/NYSTOP) powder Apply 1 application topically 3 (three) times daily. Patient taking differently: Apply 1 application  topically as needed. 09/26/20  Yes Joaquim Nam, MD  ondansetron (ZOFRAN) 4 MG tablet TAKE ONE TABLET BY MOUTH every EIGHT hours AS NEEDED FOR NAUSEA AND VOMITING 05/22/22  Yes Joaquim Nam, MD  OXYGEN Inhale 2 L into the lungs continuous.   Yes [provider]  pantoprazole (PROTONIX) 40 MG tablet TAKE ONE TABLET BY MOUTH  EVERY MORNING 05/25/22  Yes Joaquim Nam, MD  polyethylene glycol [facility] / GLYCOLAX) packet Take 17 g by mouth daily as needed for mild constipation.   Yes [provider]  potassium chloride (MICRO-K) 10 MEQ CR capsule TAKE 5 CAPSULES BY MOUTH AT NOON Patient taking differently: Take 10-20 mEq by mouth See admin instructions. Take 2 capsules by mouth in the morning, 1 capsule at noon, then 2 capsules in the evening 09/21/22  Yes Joaquim Nam, MD  rOPINIRole (REQUIP) 4 MG tablet TAKE 1 TABLET BY MOUTH EVERYDAY AT BEDTIME Patient taking differently: Take 4 mg by mouth every evening. 07/09/22  Yes Joaquim Nam, MD  Semaglutide, 2 MG/DOSE, 8 MG/3ML SOPN Inject 2 mg as directed once a week. Patient taking differently: Inject 2  mg as directed once a week. Saturday 07/29/22  Yes Joaquim Nam, MD  simethicone (GAS-X) 80 MG chewable tablet Chew 1 tablet (80 mg total) by mouth every 6 (six) hours as needed for flatulence. 11/28/21  Yes Joaquim Nam, MD  spironolactone (ALDACTONE) 25 MG tablet Take 0.5 tablets (12.5 mg total) by mouth daily. 05/26/22  Yes Joaquim Nam, MD  traMADol (ULTRAM) 50 MG tablet Take 1 tablet (50 mg total) by mouth 3 (three) times daily as needed. 11/04/22  Yes Joaquim Nam, MD  UNABLE TO FIND CPAP- At bedtime   Yes [provider]  BAYER CONTOUR NEXT TEST test strip 1 each by Other route daily. As directed 07/30/15   [provider]  Continuous Blood Gluc Receiver (DEXCOM G7 RECEIVER) DEVI by Does not apply route.    [provider]  Continuous Blood Gluc Sensor (DEXCOM G7 SENSOR) MISC 1 Device by Does not apply route as directed. Apply sensor every 10 days 03/31/22   Shamleffer, Konrad Dolores, MD  Insulin Pen Needle (GLOBAL EASE INJECT PEN NEEDLES) 32G X 4 MM MISC 1 Device by Other route in the morning, at noon, in the evening, and at bedtime. 03/31/22   Shamleffer, Konrad Dolores, MD  MICROLET LANCETS MISC 1 each by Other  route. As directed 07/30/15   [provider]  predniSONE (DELTASONE) 20 MG tablet Take two tablets once daily for 5 days Patient not taking: Reported on 11/24/2022 11/23/22   Mort Sawyers, FNP    Past Medical History:  Diagnosis Date   Anemia    Arthritis    Asthma    Chronic diastolic CHF 05/2014   Echocardiogram 05/2019: EF 70, no RWMA, mild LVH, Gr 1 DD, normal RVSF, severe LVH, borderline asc Aorta (39 mm)   CKD (chronic kidney disease)    Depression    Diabetes mellitus without complication (HCC)    Diverticulitis    Dyspnea    with exertion   GERD (gastroesophageal reflux disease)    History of blood transfusion    Hyperlipidemia    cannot tolerate statins   Hypertension    patient states she has never had high blood pressure.    Nuclear stress test    Myoview 05/2019: EF 83, no ischemia or infarction; Low Risk   Peripheral neuropathy    Pneumonia    PONV (postoperative nausea and vomiting)    RLS (restless legs syndrome)    Sleep apnea    uses CPAP   Ulcerative colitis (HCC)    dr Loreta [address]    Past Surgical History:  Procedure Laterality Date   ABDOMINAL HYSTERECTOMY     APPENDECTOMY     BIOPSY  10/10/2021   Procedure: BIOPSY;  Surgeon: Jeani Hawking, MD;  Location: [facility];  Service: Gastroenterology;;   BREAST BIOPSY Left    BREAST SURGERY     left biopsy   CARDIAC CATHETERIZATION     CESAREAN SECTION     CHOLECYSTECTOMY     COLONOSCOPY WITH PROPOFOL N/A 10/10/2021   Procedure: COLONOSCOPY WITH PROPOFOL;  Surgeon: Jeani Hawking, MD;  Location: [facility];  Service: Gastroenterology;  Laterality: N/A;   HERNIA REPAIR     INSERTION OF MESH N/A 11/15/2015   Procedure: INSERTION OF MESH;  Surgeon: Karie Soda, MD;  Location: [facility];  Service: General;  Laterality: N/A;   LAPAROSCOPIC LYSIS OF ADHESIONS N/A 11/15/2015   Procedure: LAPAROSCOPIC LYSIS OF ADHESIONS;  Surgeon: Karie Soda, MD;  Location: [facility];  Service: General;  Laterality: N/A;    RIGHT HEART CATHETERIZATION N/A 02/27/2013   Procedure: RIGHT HEART CATH;  Surgeon: Micheline Chapman, MD;  Location[facility] CATH LAB;  Service: Cardiovascular;  Laterality: N/A;   RIGHT HEART CATHETERIZATION N/A 12/14/2013   Procedure: RIGHT HEART CATH;  Surgeon: Laurey Morale, MD;  Location: [facility] CATH LAB;  Service: Cardiovascular;  Laterality: N/A;   SIGMOIDECTOMY  2010   diverticular disease   VENTRAL HERNIA REPAIR N/A 11/15/2015   Procedure: LAPAROSCOPIC VENTRAL WALL HERNIA REPAIR;  Surgeon: Karie Soda, MD;  Location: [facility];  Service: General;  Laterality: N/A;     reports that she has never smoked. She has been exposed to tobacco smoke. She has never used smokeless tobacco. She reports that she does not drink alcohol and does not use drugs.  Family History  Problem Relation Age of Onset   Heart disease Mother    Hypertension Mother    Dementia Mother    Heart disease Father    COPD Father    Hypertension Father    Heart disease Brother    Other Brother        knee replacement; hip replacement   Other Brother        cant brathe when laying down   Diabetes Maternal Grandmother    Breast cancer Neg Hx    Colon cancer Neg Hx      Physical Exam: Vitals:   11/23/22 2315 11/24/22 0045 11/24/22 0100 11/24/22 0119  BP:  (!) 149/63 (!) 141/70 (!) 141/70  Pulse:  (!) 107 (!) 107 (!) 107  Resp:    (!) 22  Temp: 98.4 F (36.9 C)   98.5 F (36.9 C)  TempSrc: Oral   Oral  SpO2:  95% 97% 97%  Weight:      Height:        Gen: Awake, alert, in respiratory distress, on BiPAP CV: Tachycardic, normal S1, S2, no murmurs  Resp: Respiratory distress, tripoding on BiPAP, diffuse expiratory wheezing throughout. Abd: Flat, normoactive, nontender MSK: Symmetric, trace pitting edema distally Skin: No rashes or lesions to exposed skin  Neuro: Alert and interactive  Psych: euthymic, appropriate    Data review:   Labs reviewed, notable for:  NA 138 134 K up to  5.2 Hyperglycemic Creatinine near baseline 1.3 WBC 11 proBNP 43 High-sensitivity troponin 28 down to 26 VBG 7.45/52, bicarb 32  Micro:  Results for orders placed or performed during the hospital encounter of 11/23/22  Resp panel by RT-PCR (RSV, Flu A&B, Covid) Anterior Nasal Swab     Status: None   Collection Time: 11/23/22  7:27 PM   Specimen: Anterior Nasal Swab  Result Value Ref Range Status   SARS Coronavirus 2 by RT PCR NEGATIVE NEGATIVE Final   Influenza A by PCR NEGATIVE NEGATIVE Final   Influenza B by PCR NEGATIVE NEGATIVE Final    Comment: (NOTE) The Xpert Xpress SARS-CoV-2/FLU/RSV plus assay is intended as an aid in the diagnosis of influenza from Nasopharyngeal swab specimens and should not be used as a sole basis for treatment. Nasal washings and aspirates are unacceptable for Xpert Xpress SARS-CoV-2/FLU/RSV testing.  Fact Sheet for Patients: BloggerCourse.com  Fact Sheet for Healthcare Providers: SeriousBroker.it  This test is not yet approved or cleared by the [place] FDA and has been authorized for detection and/or diagnosis of SARS-CoV-2 by FDA under an Emergency Use Authorization (EUA). This EUA will remain in effect (meaning this test can be used) for the  duration of the COVID-19 declaration under Section 564(b)(1) of the Act, 21 U.S.C. section 360bbb-3(b)(1), unless the authorization is terminated or revoked.     Resp Syncytial Virus by PCR NEGATIVE NEGATIVE Final    Comment: (NOTE) Fact Sheet for Patients: BloggerCourse.com  Fact Sheet for Healthcare Providers: SeriousBroker.it  This test is not yet approved or cleared by the [place] FDA and has been authorized for detection and/or diagnosis of SARS-CoV-2 by FDA under an Emergency Use Authorization (EUA). This EUA will remain in effect (meaning this test can be used) for the duration of  the COVID-19 declaration under Section 564(b)(1) of the Act, 21 U.S.C. section 360bbb-3(b)(1), unless the authorization is terminated or revoked.  Performed at [facility], [address], [place], [place] [address]     Imaging reviewed:  DG Chest 2 View  Result Date: 11/23/2022 CLINICAL DATA:  Short of breath and chest pain EXAM: CHEST - 2 VIEW COMPARISON:  11/23/2022 FINDINGS: Frontal and lateral views of the chest demonstrate an unremarkable cardiac silhouette. No acute airspace disease, effusion, or pneumothorax. No acute bony abnormalities. IMPRESSION: 1. No acute intrathoracic process. Electronically Signed   By: Sharlet Salina M.D.   On: 11/23/2022 17:19   DG Chest 2 View  Result Date: 11/23/2022 CLINICAL DATA:  Shortness of breath EXAM: CHEST - 2 VIEW COMPARISON:  09/21/2022 FINDINGS: The heart size and mediastinal contours are stable. Coarsened interstitial markings bilaterally. No new airspace consolidation. No pleural effusion or pneumothorax. The visualized skeletal structures are unremarkable. IMPRESSION: Coarsened interstitial markings bilaterally, which may reflect chronic interstitial lung disease. No new airspace consolidation. Electronically Signed   By: Duanne Guess D.O.   On: 11/23/2022 13:32    EKG: Reviewed right bundle branch block, LAFB, QTc 538 not corrected for bundle branch, no acute ischemic changes  ED Course: Treated with methylprednisolone 125 mg IV, ceftriaxone, doxycycline, nebulizers.  Placed on a trial of BiPAP.  Patient uncomfortable on BiPAP plan to transition off   Assessment/Plan:  67 y.o. 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  However this is poorly tolerated and so BiPAP was discontinued.  Now she is on 4 L.  Chest x-ray with no acute findings.  -Status post methylprednisolone 125 mg x 1 in the ED, continue 60 mg IV every 12 hours due to her initial respiratory distress  -Continue ceftriaxone 1 g IV every 24 hours, doxycycline 100 mg twice daily (QT is prolonged) -Scheduled DuoNebs every 4 hours, albuterol every 2 hours as needed, incentive spirometry, OOB   Acute myocardial injury EKG with RBBB, no acute ischemic changes, high-sensitivity Trope downtrending 28-26.  Presumed demand in setting of respiratory failure per above  Hyperkalemia Unclear if this is a spurious result, creatinine at baseline -Repeat potassium -Hold home spironolactone for now  Hyponatremia mild -If continued downtrend send serum osmolality, urine osmolality, and urine sodium  Chronic medical problems: Diabetes type 2 with hyperglycemia:  Home regimen is Tresiba 110 units daily in the morning.  Short acting on sliding scale.  -Changed to Semglee and split to 55 units twice daily (no reduction in the setting of her hyperglycemia and steroids) -SSI for resistant with meals  and bedtime -Continue home gabapentin for neuropathy  HFpEF, without acute exacerbation cMR in' 21 with preserved EF -Continue home Lasix 60 mg twice daily p.o. -She is also on metolazone 5 mg as needed, can consider if she becomes diuretic resistant  ?  ASCVD: Continue aspirin 81 mg daily  HLD: She is on PCSK9 outpatient  CKD 3: Creatinine baseline approximately 1.3  Chronic pain: Continue home tramadol 50 mg 3 times daily as needed for severe  Mood disorder: Continue home bupropion, duloxetine  Restless legs: Continue home ropinirole  Body mass index is 42.19 kg/m.  Obesity class III  DVT prophylaxis:  Lovenox Code Status:  Full Code Diet:  Diet Orders (From admission, onward)     Start     Ordered   11/23/22 2123  Diet Carb  Modified Fluid consistency: Thin; Room service appropriate? Yes  Diet effective now       Question Answer Comment  Diet-HS Snack? Nothing   Calorie Level Medium 1600-2000   Fluid consistency: Thin   Room service appropriate? Yes      11/23/22 2123           Family Communication: No Consults: None Admission status:   Inpatient, Step Down Unit  Severity of Illness: The appropriate patient status for this patient is INPATIENT. Inpatient status is judged to be reasonable and necessary in order to provide the required intensity of service to ensure the patient's safety. The patient's presenting symptoms, physical exam findings, and initial radiographic and laboratory data in the context of their chronic comorbidities is felt to place them at high risk for further clinical deterioration. Furthermore, it is not anticipated that the patient will be medically stable for discharge from the hospital within 2 midnights of admission.   * I certify that at the point of admission it is my clinical judgment that the patient will require inpatient hospital care spanning beyond 2 midnights from the point of admission due to high intensity of service, high risk for further deterioration and high frequency of surveillance required.*   Dolly Rias, MD [facility]  How to contact the [facility] Attending or Consulting provider 7A - 7P or covering provider during after hours 7P -7A, for this patient.  Check the care team in [facility] and look for a) attending/consulting [facility] provider listed and b) the [facility] team listed Log into [email or web address] and use [place] universal password to access. If you do not have the password, please contact the hospital operator. Locate the [facility] provider you are looking for under [facility] and page to a number that you can be directly reached. If you still have difficulty reaching the provider, please page the [facility] (Director on Call) for the Hospitalists listed on amion  for assistance.  11/24/2022, 3:47 AM

## 2022-11-24 NOTE — ED Notes (Signed)
Pt is NSR w/BBB on monitor

## 2022-11-25 DIAGNOSIS — J441 Chronic obstructive pulmonary disease with (acute) exacerbation: Secondary | ICD-10-CM | POA: Diagnosis not present

## 2022-11-25 LAB — GLUCOSE, CAPILLARY
Glucose-Capillary: 312 mg/dL — ABNORMAL HIGH (ref 70–99)
Glucose-Capillary: 331 mg/dL — ABNORMAL HIGH (ref 70–99)
Glucose-Capillary: 306 mg/dL — ABNORMAL HIGH (ref 70–99)
Glucose-Capillary: 309 mg/dL — ABNORMAL HIGH (ref 70–99)

## 2022-11-25 MED ORDER — IPRATROPIUM-ALBUTEROL 0.5-2.5 (3) MG/3ML IN SOLN
3.0000 mL | Freq: Four times a day (QID) | RESPIRATORY_TRACT | Status: DC
  Administered 2022-11-25 – 2022-11-26 (×6): 3 mL via RESPIRATORY_TRACT
  Filled 2022-11-25 (×6): qty 3

## 2022-11-25 MED ORDER — INSULIN GLARGINE-YFGN 100 UNIT/ML ~~LOC~~ SOLN
65.0000 [IU] | Freq: Two times a day (BID) | SUBCUTANEOUS | Status: DC
  Administered 2022-11-25 (×2): 65 [IU] via SUBCUTANEOUS
  Filled 2022-11-25 (×4): qty 0.65

## 2022-11-25 MED ORDER — K PHOS MONO-SOD PHOS DI & MONO 155-852-130 MG PO TABS
500.0000 mg | ORAL_TABLET | Freq: Two times a day (BID) | ORAL | Status: AC
  Administered 2022-11-25 (×2): 500 mg via ORAL
  Filled 2022-11-25 (×2): qty 2

## 2022-11-25 NOTE — Plan of Care (Signed)

## 2022-11-25 NOTE — Progress Notes (Signed)
Mobility Specialist Progress Note:   11/25/22 1600  Mobility  Activity Ambulated with assistance in room  Level of Assistance Standby assist, set-up cues, supervision of patient - no hands on  Assistive Device Front wheel walker  Distance Ambulated (ft) 30 ft  Activity Response Tolerated fair (SOB)  Mobility Referral Yes  $Mobility charge 1 Mobility  Mobility Specialist Start Time (ACUTE ONLY) 1550  Mobility Specialist Stop Time (ACUTE ONLY) 1610  Mobility Specialist Time Calculation (min) (ACUTE ONLY) 20 min    Pre Mobility: 92% 2LO2 During Mobility: 86-91% 2LO2- unreliable pleth majority of time Post Mobility: 93% 2LO2  Pt hesitant, yet agreeable to mobility session. No physical assistance required, only supervision for safety. SpO2 pleth unreliable majority of session, however 86-91% on 2LO2. Pt recovered to 90% immediately once returned to chair. Distance limited by SOB/quick fatigue. Pt back in chair with all needs met.   Addison Lank Mobility Specialist Please contact via SecureChat or  Rehab office at [phone]

## 2022-11-25 NOTE — Progress Notes (Signed)
PROGRESS NOTE    Kathleen Hatfield  NFA:[id number] DOB: 08-03-1955 DOA: 11/23/2022 PCP: Joaquim Nam, MD   Chief Complaint  Patient presents with   Shortness of Breath   Chest Pain    Brief Narrative:   Kathleen Hatfield is a 67 y.o. female with past medical history of COPD, congestive heart failure on oxygen at baseline,  CKD stage III, morbid obesity, sleep apnea on CPAP, diabetes, ulcerative colitis, presented to the hospital with worsening dyspnea cough and productive sputum with difficulty lying flat in bed.  She was taking her inhalers without any relief so she decided come to the hospital.  Initial labs showed WBC elevated at 11.2.  Creatinine at 1.3.  COVID and influenza was negative.  Respiratory viral panel however showed rhinovirus.  Patient was then admitted to the hospital for further evaluation and treatment.   Assessment & Plan:   Active Problems:   Mixed diabetic hyperlipidemia associated with type 2 diabetes mellitus (HCC)   Essential hypertension   Hyperlipidemia   OSA on CPAP   (HFpEF) heart failure with preserved ejection fraction (HCC)   COPD (chronic obstructive pulmonary disease) (HCC)   RLS (restless legs syndrome)   Diabetes mellitus (HCC)   COPD exacerbation (HCC)   Chronic hypoxic respiratory failure (HCC)   Rhinovirus infection   COPD exacerbation Acute on chronic hypoxic respiratory failure Rhinovirus infection. Patient is on 2 L of oxygen at baseline but initially required BiPAP which was subsequently discontinued.,  Morning she is back on 4 L nasal cannula, she remains with significantly diminished air entry bilaterally, so I will continue with IV Solu-Medrol today, she still with a productive cough, dark phlegm, so we will continue with IV Rocephin and doxycycline, she was encouraged with incentive spirometry, flutter valve, out of bed to chair, she remains symptomatic with significant dyspnea with minimal activity. - Respiratory viral panel has resulted in  rhinoviral infection at this time.     Demand ischemia. Secondary respiratory failure.  EKG with RBBB, no acute ischemic changes, high-sensitivity Trope downtrending 28-26.     Hypokalemia.   -Replaced   hyponatremia mild Mild.    Hypomagnesemia.  replcaed   Hypophosphatemia.  replaced   Diabetes type 2 with hyperglycemia:  [place] and sliding scale at home.   -CBG significantly uncontrolled, I will increase her Semglee from 55 to 65 units twice daily (is on Tresiba 110 units daily at home)    HFpEF, without acute exacerbation Cardiac MRI in 2021 with preserved EF.  In' 21 with preserved EF.  Continue home Lasix 60 daily.  On as needed midodrine as needed at home.    ASCVD, hyperlipidemia: Continue aspirin 81 mg daily.  On PCSK9 as outpatient.   CKD 3: Creatinine baseline approximately 1.3.  With check levels in AM.   Chronic pain: Continue  tramadol as needed.   Mood disorder: Continue home bupropion, duloxetine   Restless legs: Continue home ropinirole  DVT prophylaxis: lovenox Code Status: Full Family Communication: none at bedside  Disposition:   Status is: Inpatient    Consultants:  none   Subjective:  Reports headache, generalized weakness, fatigue  Objective: Vitals:   11/25/22 0742 11/25/22 0759 11/25/22 1143 11/25/22 1209  BP:  (!) 115/49  (!) 140/93  Pulse:  94 90 92  Resp: 17 19 20 18   Temp:  98.1 F (36.7 C)  97.8 F (36.6 C)  TempSrc:  Oral  Oral  SpO2: 96% 96%  94%  Weight:  Height:        Intake/Output Summary (Last 24 hours) at 11/25/2022 1317 Last data filed at 11/25/2022 1036 Gross per 24 hour  Intake 217.6 ml  Output --  Net 217.6 ml   Filed Weights   11/23/22 1931  Weight: 115 kg    Examination:  Awake Alert, Oriented X 3, No new F.N deficits, Normal affect Symmetrical Chest wall movement, diminished air entry bilaterally with wheezing RRR,No Gallops,Rubs or new Murmurs, No Parasternal Heave +ve B.Sounds, Abd Soft,  No tenderness, No rebound - guarding or rigidity. No Cyanosis, Clubbing or edema, No new Rash or bruise      Data Reviewed: I have personally reviewed following labs and imaging studies  CBC: Recent Labs  Lab 11/23/22 1200 11/23/22 1600 11/23/22 1937 11/24/22 0237 11/25/22 0400  WBC 12.1* 11.2*  --  9.1 16.9*  NEUTROABS 7.6  --   --   --   --   HGB 13.2 13.3 13.6 13.6 13.4  HCT 41.1 40.6 40.0 41.5 40.3  MCV 94.2 93.8  --  94.1 92.9  PLT 322.0 319  --  327 374    Basic Metabolic Panel: Recent Labs  Lab 11/23/22 1600 11/23/22 1937 11/23/22 2010 11/24/22 0340 11/25/22 0400  NA 138 134*  --  134* 134*  K 3.7 5.2*  --  2.9* 4.1  CL 91*  --   --  92* 93*  CO2 32  --   --  23 28  GLUCOSE 324*  --   --  491* 302*  BUN 23  --   --  18 26*  CREATININE 1.38*  --   --  1.47* 1.23*  CALCIUM 9.1  --   --  8.9 9.1  MG  --   --  1.8 1.6* 2.0  PHOS  --   --   --  1.9*  --     GFR: Estimated Creatinine Clearance: 56.2 mL/min (A) (by C-G formula based on SCr of 1.23 mg/dL (H)).  Liver Function Tests: No results for input(s): "AST", "ALT", "ALKPHOS", "BILITOT", "PROT", "ALBUMIN" in the last 168 hours.  CBG: Recent Labs  Lab 11/24/22 1217 11/24/22 1720 11/24/22 2102 11/25/22 0800 11/25/22 1210  GLUCAP 387* 352* 351* 312* 331*     Recent Results (from the past 240 hour(s))  Resp panel by RT-PCR (RSV, Flu A&B, Covid) Anterior Nasal Swab     Status: None   Collection Time: 11/23/22  7:27 PM   Specimen: Anterior Nasal Swab  Result Value Ref Range Status   SARS Coronavirus 2 by RT PCR NEGATIVE NEGATIVE Final   Influenza A by PCR NEGATIVE NEGATIVE Final   Influenza B by PCR NEGATIVE NEGATIVE Final    Comment: (NOTE) The Xpert Xpress SARS-CoV-2/FLU/RSV plus assay is intended as an aid in the diagnosis of influenza from Nasopharyngeal swab specimens and should not be used as a sole basis for treatment. Nasal washings and aspirates are unacceptable for Xpert Xpress  SARS-CoV-2/FLU/RSV testing.  Fact Sheet for Patients: BloggerCourse.com  Fact Sheet for Healthcare Providers: SeriousBroker.it  This test is not yet approved or cleared by the [place] FDA and has been authorized for detection and/or diagnosis of SARS-CoV-2 by FDA under an Emergency Use Authorization (EUA). This EUA will remain in effect (meaning this test can be used) for the duration of the COVID-19 declaration under Section 564(b)(1) of the Act, 21 U.S.C. section 360bbb-3(b)(1), unless the authorization is terminated or revoked.     Resp Syncytial Virus  by PCR NEGATIVE NEGATIVE Final    Comment: (NOTE) Fact Sheet for Patients: BloggerCourse.com  Fact Sheet for Healthcare Providers: SeriousBroker.it  This test is not yet approved or cleared by the [place] FDA and has been authorized for detection and/or diagnosis of SARS-CoV-2 by FDA under an Emergency Use Authorization (EUA). This EUA will remain in effect (meaning this test can be used) for the duration of the COVID-19 declaration under Section 564(b)(1) of the Act, 21 U.S.C. section 360bbb-3(b)(1), unless the authorization is terminated or revoked.  Performed at [facility], [address], [place], [place] [address]   Respiratory (~20 pathogens) panel by PCR     Status: Abnormal   Collection Time: 11/24/22  2:37 AM   Specimen: Nasopharyngeal Swab; Respiratory  Result Value Ref Range Status   Adenovirus NOT DETECTED NOT DETECTED Final   Coronavirus 229E NOT DETECTED NOT DETECTED Final    Comment: (NOTE) The Coronavirus on the Respiratory Panel, DOES NOT test for the novel  Coronavirus (2019 nCoV)    Coronavirus HKU1 NOT DETECTED NOT DETECTED Final   Coronavirus NL63 NOT DETECTED NOT DETECTED Final   Coronavirus OC43 NOT DETECTED NOT DETECTED Final   Metapneumovirus NOT DETECTED NOT DETECTED Final    Rhinovirus / Enterovirus DETECTED (A) NOT DETECTED Final   Influenza A NOT DETECTED NOT DETECTED Final   Influenza B NOT DETECTED NOT DETECTED Final   Parainfluenza Virus 1 NOT DETECTED NOT DETECTED Final   Parainfluenza Virus 2 NOT DETECTED NOT DETECTED Final   Parainfluenza Virus 3 NOT DETECTED NOT DETECTED Final   Parainfluenza Virus 4 NOT DETECTED NOT DETECTED Final   Respiratory Syncytial Virus NOT DETECTED NOT DETECTED Final   Bordetella pertussis NOT DETECTED NOT DETECTED Final   Bordetella Parapertussis NOT DETECTED NOT DETECTED Final   Chlamydophila pneumoniae NOT DETECTED NOT DETECTED Final   Mycoplasma pneumoniae NOT DETECTED NOT DETECTED Final    Comment: Performed at [facility], [address] [place], [place] [address]         Radiology Studies: DG Chest 2 View  Result Date: 11/23/2022 CLINICAL DATA:  Short of breath and chest pain EXAM: CHEST - 2 VIEW COMPARISON:  11/23/2022 FINDINGS: Frontal and lateral views of the chest demonstrate an unremarkable cardiac silhouette. No acute airspace disease, effusion, or pneumothorax. No acute bony abnormalities. IMPRESSION: 1. No acute intrathoracic process. Electronically Signed   By: Sharlet Salina M.D.   On: 11/23/2022 17:19        Scheduled Meds:  aspirin EC  81 mg Oral Daily   buPROPion  300 mg Oral q morning   doxycycline  100 mg Oral Q12H   DULoxetine  60 mg Oral QHS   enoxaparin (LOVENOX) injection  50 mg Subcutaneous Q24H   ferrous sulfate  325 mg Oral QODAY   furosemide  60 mg Oral BID   gabapentin  300 mg Oral QHS   guaiFENesin  600 mg Oral BID   influenza vaccine adjuvanted  0.5 mL Intramuscular Tomorrow-1000   insulin aspart  0-20 Units Subcutaneous TID WC   insulin aspart  0-5 Units Subcutaneous QHS   insulin aspart  5 Units Subcutaneous TID WC   insulin glargine-yfgn  65 Units Subcutaneous BID   ipratropium-albuterol  3 mL Nebulization QID   magnesium oxide  400 mg Oral BID    methylPREDNISolone (SOLU-MEDROL) injection  40 mg Intravenous Q12H   pantoprazole  40 mg Oral q morning   rOPINIRole  4 mg Oral  QPM   sodium chloride flush  3 mL Intravenous Q12H   Continuous Infusions:  cefTRIAXone (ROCEPHIN)  IV 1 g (11/24/22 1908)     LOS: 2 days        Huey Bienenstock, MD [facility]   To contact the attending provider between 7A-7P or the covering provider during after hours 7P-7A, please log into the web site [email or web address] and access using universal [place] password for that web site. If you do not have the password, please call the hospital operator.  11/25/2022, 1:17 PM

## 2022-11-25 NOTE — Progress Notes (Signed)
   11/25/22 0916  TOC Brief Assessment  Insurance and Status Reviewed [employer] Medicare HMO PPO)  Patient has primary care physician Yes Para March, Dwana Curd, MD)  Home environment has been reviewed Lives at home with S/O, [name]- Will transport at DC. (PAtient is on 2L O2 at baseline provided by [employer])  Prior level of function: requires rollator; S/O assists with ADL's  Prior/Current Home Services No current home services (No Current HH but [facility] has heped in the past.  [facility] for [facility] ([facility]))  Social Determinants of Health Reivew SDOH reviewed no interventions necessary  Readmission risk has been reviewed Yes (22%)  Transition of care needs transition of care needs identified, TOC will continue to follow

## 2022-11-26 ENCOUNTER — Ambulatory Visit: Payer: Medicare HMO | Admitting: Neurology

## 2022-11-26 ENCOUNTER — Ambulatory Visit (HOSPITAL_COMMUNITY): Payer: Medicare HMO

## 2022-11-26 DIAGNOSIS — J441 Chronic obstructive pulmonary disease with (acute) exacerbation: Secondary | ICD-10-CM | POA: Diagnosis not present

## 2022-11-26 LAB — MAGNESIUM: Magnesium: 2.2 mg/dL (ref 1.7–2.4)

## 2022-11-26 LAB — CBC
HCT: 40.3 % (ref 36.0–46.0)
Hemoglobin: 13.5 g/dL (ref 12.0–15.0)
MCH: 31.1 pg (ref 26.0–34.0)
MCHC: 33.5 g/dL (ref 30.0–36.0)
MCV: 92.9 fL (ref 80.0–100.0)
Platelets: 385 10*3/uL (ref 150–400)
RBC: 4.34 MIL/uL (ref 3.87–5.11)
RDW: 14.2 % (ref 11.5–15.5)
WBC: 14.9 10*3/uL — ABNORMAL HIGH (ref 4.0–10.5)
nRBC: 0 % (ref 0.0–0.2)

## 2022-11-26 LAB — BASIC METABOLIC PANEL
Anion gap: 9 (ref 5–15)
BUN: 31 mg/dL — ABNORMAL HIGH (ref 8–23)
CO2: 28 mmol/L (ref 22–32)
Calcium: 8.8 mg/dL — ABNORMAL LOW (ref 8.9–10.3)
Chloride: 98 mmol/L (ref 98–111)
Creatinine, Ser: 1.11 mg/dL — ABNORMAL HIGH (ref 0.44–1.00)
GFR, Estimated: 54 mL/min — ABNORMAL LOW (ref >60)
Glucose, Bld: 232 mg/dL — ABNORMAL HIGH (ref 70–99)
Potassium: 3.9 mmol/L (ref 3.5–5.1)
Sodium: 135 mmol/L (ref 135–145)

## 2022-11-26 LAB — PHOSPHORUS: Phosphorus: 3.7 mg/dL (ref 2.5–4.6)

## 2022-11-26 LAB — HEMOGLOBIN A1C
Hgb A1c MFr Bld: 11.2 % — ABNORMAL HIGH (ref 4.8–5.6)
Mean Plasma Glucose: 274.74 mg/dL

## 2022-11-26 LAB — GLUCOSE, CAPILLARY
Glucose-Capillary: 260 mg/dL — ABNORMAL HIGH (ref 70–99)
Glucose-Capillary: 331 mg/dL — ABNORMAL HIGH (ref 70–99)
Glucose-Capillary: 281 mg/dL — ABNORMAL HIGH (ref 70–99)
Glucose-Capillary: 290 mg/dL — ABNORMAL HIGH (ref 70–99)
Glucose-Capillary: 280 mg/dL — ABNORMAL HIGH (ref 70–99)

## 2022-11-26 MED ORDER — INSULIN GLARGINE-YFGN 100 UNIT/ML ~~LOC~~ SOLN
75.0000 [IU] | Freq: Two times a day (BID) | SUBCUTANEOUS | Status: DC
  Administered 2022-11-26 (×2): 75 [IU] via SUBCUTANEOUS
  Filled 2022-11-26 (×4): qty 0.75

## 2022-11-26 MED ORDER — INSULIN ASPART 100 UNIT/ML IJ SOLN
8.0000 [IU] | Freq: Three times a day (TID) | INTRAMUSCULAR | Status: DC
  Administered 2022-11-26 – 2022-11-29 (×6): 8 [IU] via SUBCUTANEOUS

## 2022-11-26 MED ORDER — IPRATROPIUM-ALBUTEROL 0.5-2.5 (3) MG/3ML IN SOLN
3.0000 mL | Freq: Three times a day (TID) | RESPIRATORY_TRACT | Status: DC
  Administered 2022-11-26 – 2022-11-30 (×10): 3 mL via RESPIRATORY_TRACT
  Filled 2022-11-26 (×10): qty 3

## 2022-11-26 MED ORDER — LIVING WELL WITH DIABETES BOOK
Freq: Once | Status: AC
  Filled 2022-11-26: qty 1

## 2022-11-26 NOTE — Progress Notes (Signed)
Initial Nutrition Assessment  DOCUMENTATION CODES:   Morbid obesity  INTERVENTION:  Verbal education along with handout.   NUTRITION DIAGNOSIS:   Food and nutrition related knowledge deficit related to limited prior education as evidenced by other (comment) (reports inaccurate information).    GOAL:   Patient will meet greater than or equal to 90% of their needs    MONITOR:   PO intake, Labs  REASON FOR ASSESSMENT:   Consult Diet education  ASSESSMENT:   67 y.o. F, Presented with worsening dyspnea on exertion, productive cough and elevated BG levels. who admitted with COP exacerbation. Pmhx; CHF, DM, Diverticulitis, CKD III. With no appetite changes noted. During round MD asked RD to consult for meal management. Pt setting up in chair ordering lunch at time of visit. She stated that she has been trying to eat well for DM, although gets confused as to what she can and cannot eat. She lives at home with boyfriend and relies on him to do shopping. RDN provided education on carb counting, explained how to do this using hospital menu. Provided handouts to pt.   Meds; Cymbalta, lovenox, Lasix, gabapenton, novolog  Labs; BG; 232  NUTRITION - FOCUSED PHYSICAL EXAM:  Flowsheet Row Most Recent Value  Orbital Region No depletion  Upper Arm Region No depletion  Thoracic and Lumbar Region No depletion  Buccal Region No depletion  Temple Region No depletion  Clavicle Bone Region No depletion  Clavicle and Acromion Bone Region No depletion  Scapular Bone Region No depletion  Dorsal Hand No depletion  Patellar Region No depletion  Anterior Thigh Region No depletion  Posterior Calf Region No depletion  Edema (RD Assessment) Unable to assess  Hair Reviewed  Eyes Reviewed  Mouth Reviewed  Skin Reviewed  Nails Reviewed       Diet Order:   Diet Order             Diet Carb Modified Fluid consistency: Thin; Room service appropriate? Yes  Diet effective now                    EDUCATION NEEDS:   Education needs have been addressed  Skin:  Skin Assessment: Reviewed RN Assessment  Last BM:  9/14 Last domcumented.  Height:   Ht Readings from Last 1 Encounters:  11/23/22 5\' 5"  (1.651 m)    Weight:   Wt Readings from Last 1 Encounters:  11/23/22 115 kg    Ideal Body Weight:  57 kg  BMI:  Body mass index is 42.19 kg/m.  Estimated Nutritional Needs:   Kcal:  1610-9604 kcal  Protein:  59-71 g  Fluid:  5409-8119 ml    Ricardo Jericho, RDN, LDN

## 2022-11-26 NOTE — Progress Notes (Signed)
PT Cancellation Note  Patient Details Name: Kathleen Hatfield MRN: [id number] DOB: 1955-05-10   Cancelled Treatment:    Reason Eval/Treat Not Completed: Fatigue/lethargy limiting ability to participate  Patient reports recently worked with mobility team and is too fatigued at this time. Agrees to participate later today.    Jerolyn Center, PT Acute Rehabilitation Services  Office [phone]  Zena Amos 11/26/2022, 10:56 AM

## 2022-11-26 NOTE — Progress Notes (Signed)
Mobility Specialist Progress Note:   11/26/22 0940  Mobility  Activity Ambulated with assistance to bathroom  Level of Assistance Standby assist, set-up cues, supervision of patient - no hands on  Assistive Device Front wheel walker  Distance Ambulated (ft) 30 ft  Activity Response Tolerated well  Mobility Referral Yes  $Mobility charge 1 Mobility  Mobility Specialist Start Time (ACUTE ONLY) 0940  Mobility Specialist Stop Time (ACUTE ONLY) 0955  Mobility Specialist Time Calculation (min) (ACUTE ONLY) 15 min    Pre Mobility: 94% 2LO2 During Mobility: 87-94% 2LO2 Post Mobility: 97% 2LO2  Pt agreeable to mobility. Required no physical assistance, SV. Ambulated to BR, displaying SOB throughout. Pt left in bed with all needs met.  Addison Lank Mobility Specialist Please contact via SecureChat or  Rehab office at [phone]

## 2022-11-26 NOTE — TOC Progression Note (Signed)
Transition of Care [facility]) - Progression Note    Patient Details  Name: Kathleen Hatfield MRN: [id number] Date of Birth: 08-Apr-1955  Transition [facility] CM/SW Contact  Gordy Clement, RN Phone Number: 11/26/2022, 9:30 AM  Clinical Narrative:    CM confirmed with patient that she has portable O2 to transport home with.  S/O [name] will bring when he comes to pick her up.  TOC will continue to follow patient for any additional discharge needs           Expected Discharge Plan and Services                                               Social Determinants of Health (SDOH) Interventions SDOH Screenings   Food Insecurity: No Food Insecurity (11/24/2022)  Housing: Medium Risk (11/24/2022)  Transportation Needs: No Transportation Needs (11/24/2022)  Utilities: Not At Risk (11/24/2022)  Alcohol Screen: Low Risk  (07/14/2022)  Depression (PHQ2-9): High Risk (09/21/2022)  Financial Resource Strain: Low Risk  (07/14/2022)  Physical Activity: Insufficiently Active (07/14/2022)  Social Connections: Socially Isolated (07/14/2022)  Stress: No Stress Concern Present (07/14/2022)  Tobacco Use: Low Risk  (11/23/2022)    Readmission Risk Interventions     No data to display

## 2022-11-26 NOTE — Progress Notes (Signed)
Physical Therapy Treatment Patient Details Name: Kathleen Hatfield MRN: [id number] DOB: [birth year] Today's Date: 11/26/2022   History of Present Illness This 67 y.o. female presented to ED on 11/23/22 with SOB and cough and productive sputum difficulty lying flat in bed.  Respiratory panel showed rhinovirus. Blood sugars also running high likely due to steriod use.  PMH includes:  CKD stage III, chronic diastolic CHF, COPD with asthma, depression, insulin-dependent type 2 diabetes, GERD, hyperlipidemia, hypertension, RLS, OSA on CPAP, ulcerative colitis, peripheral neuropathy, morbid obesity.    PT Comments  Patient able to progress to ambulating 100 ft with RW on 2L O2 (portable tank) with sats >=87% (mostly 90-91% throughout). Required prolonged seated rest break due to dyspnea with good demonstration of pursed lip breathing.Repeated shorter distance ambulation in the room with oxygen extension tubing and noted sats dropped to 86% upon sitting with recovery to 88% in ~30 seconds.    If plan is discharge home, recommend the following: A little help with walking and/or transfers;Help with stairs or ramp for entrance;A lot of help with bathing/dressing/bathroom;Assistance with cooking/housework   Can travel by private vehicle        Equipment Recommendations  None recommended by PT    Recommendations for Other Services       Precautions / Restrictions Precautions Precautions: Fall Precaution Comments: O2 dependent     Mobility  Bed Mobility               General bed mobility comments: in recliner    Transfers Overall transfer level: Needs assistance   Transfers: Sit to/from Stand, Bed to chair/wheelchair/BSC Sit to Stand: Modified independent (Device/Increase time)           General transfer comment: from recliner and toilet    Ambulation/Gait Ambulation/Gait assistance: Supervision Gait Distance (Feet): 100 Feet (seated rest; 25 ft) Assistive device: Rolling walker (2  wheels) Gait Pattern/deviations: Step-to pattern, Decreased stride length, Trunk flexed Gait velocity: encouraged not to rush as she got close to where she wanted to sit down. Gait velocity interpretation: 1.31 - 2.62 ft/sec, indicative of limited community ambulator   General Gait Details: on 2L; vc for upright posture and proximity to [employer] Mobility     Tilt Bed    Modified Rankin (Stroke Patients Only)       Balance Overall balance assessment: Needs assistance   Sitting balance-Leahy Scale: Good       Standing balance-Leahy Scale: Fair                              Cognition Arousal: Alert Behavior During Therapy: WFL for tasks assessed/performed Overall Cognitive Status: Within Functional Limits for tasks assessed                                          Exercises Other Exercises Other Exercises: Encouraged use of IS (pt too fatigued, incr RR to perform after walking).    General Comments General comments (skin integrity, edema, etc.): incr RR with ambulation; good use of PLB; sats on 2L >87% (mostly 90-91%). Pt reported at home she takes off her oxygen when moving room to room because it will not reach as far as she needs it to. Discussed importance of oxygen when "active"  and she said she will have her boyfriend help her rearrange things so she can keep it on.      Pertinent Vitals/Pain Pain Assessment Pain Assessment: Faces Faces Pain Scale: Hurts a little bit Pain Location: knees with ambulation Pain Descriptors / Indicators: Aching Pain Intervention(s): Limited activity within patient's tolerance    Home Living                          Prior Function            PT Goals (current goals can now be found in the care plan section) Acute Rehab PT Goals Patient Stated Goal: breathing better Time For Goal Achievement: 12/08/22 Potential to Achieve Goals: Good Progress towards PT  goals: Progressing toward goals    Frequency    Min 1X/week      PT Plan      Co-evaluation              AM-PAC PT "6 Clicks" Mobility   Outcome Measure  Help needed turning from your back to your side while in a flat bed without using bedrails?: None Help needed moving from lying on your back to sitting on the side of a flat bed without using bedrails?: A Little Help needed moving to and from a bed to a chair (including a wheelchair)?: A Little Help needed standing up from a chair using your arms (e.g., wheelchair or bedside chair)?: A Little Help needed to walk in hospital room?: A Little Help needed climbing 3-5 steps with a railing? : A Lot 6 Click Score: 18    End of Session Equipment Utilized During Treatment: Oxygen Activity Tolerance: Patient limited by fatigue Patient left: in chair;with call bell/phone within reach   PT Visit Diagnosis: Muscle weakness (generalized) (M62.81);Difficulty in walking, not elsewhere classified (R26.2)     Time: 4332-9518 PT Time Calculation (min) (ACUTE ONLY): 34 min  Charges:    $Gait Training: 23-37 mins PT General Charges $$ ACUTE PT VISIT: 1 Visit                      Jerolyn Center, PT Acute Rehabilitation Services  Office [phone]    Zena Amos 11/26/2022, 2:41 PM

## 2022-11-26 NOTE — Progress Notes (Signed)
PROGRESS NOTE    Kathleen Hatfield  OZH:[id number] DOB: 1955/08/28 DOA: 11/23/2022 PCP: Joaquim Nam, MD   Chief Complaint  Patient presents with   Shortness of Breath   Chest Pain    Brief Narrative:   Kathleen Hatfield is a 67 y.o. female with past medical history of COPD, congestive heart failure on oxygen at baseline,  CKD stage III, morbid obesity, sleep apnea on CPAP, diabetes, ulcerative colitis, presented to the hospital with worsening dyspnea cough and productive sputum with difficulty lying flat in bed.  She was taking her inhalers without any relief so she decided come to the hospital.  Initial labs showed WBC elevated at 11.2.  Creatinine at 1.3.  COVID and influenza was negative.  Respiratory viral panel however showed rhinovirus.  Patient was then admitted to the hospital for further evaluation and treatment.   Assessment & Plan:   Active Problems:   Mixed diabetic hyperlipidemia associated with type 2 diabetes mellitus (HCC)   Essential hypertension   Hyperlipidemia   OSA on CPAP   (HFpEF) heart failure with preserved ejection fraction (HCC)   COPD (chronic obstructive pulmonary disease) (HCC)   RLS (restless legs syndrome)   Diabetes mellitus (HCC)   COPD exacerbation (HCC)   Chronic hypoxic respiratory failure (HCC)   Rhinovirus infection   COPD exacerbation Acute on chronic hypoxic respiratory failure Rhinovirus infection. Patient is on 2 L of oxygen at baseline but initially required BiPAP which was subsequently discontinued.,  Morning she is back on 4 L nasal cannula, she remains with significantly diminished air entry bilaterally, so I will continue with IV Solu-Medrol today, she still with a productive cough, dark phlegm, so we will continue with IV Rocephin and doxycycline, she was encouraged with incentive spirometry, flutter valve, out of bed to chair, she remains symptomatic with significant dyspnea with minimal activity. - Respiratory viral panel has resulted in  rhinoviral infection at this time.     Demand ischemia. Secondary respiratory failure.  EKG with RBBB, no acute ischemic changes, high-sensitivity Trope downtrending 28-26.     Hypokalemia.   -Replaced   hyponatremia mild Mild.    Hypomagnesemia.  replcaed   Hypophosphatemia.  replaced   Diabetes type 2 with hyperglycemia:  [place] and sliding scale at home.   -CBG remains significantly uncontrolled, her A1c at baseline 12.9 last month, I did increase her Semglee further to 75 units twice daily, continue with insulin sliding scale. (She is on Tresiba 110 units daily at home)    HFpEF, without acute exacerbation Cardiac MRI in 2021 with preserved EF.  In' 21 with preserved EF.  Continue home Lasix 60 daily.  On as needed midodrine as needed at home.    ASCVD, hyperlipidemia: Continue aspirin 81 mg daily.  On PCSK9 as outpatient.   CKD 3: Creatinine baseline approximately 1.3.  With check levels in AM.   Chronic pain: Continue  tramadol as needed.   Mood disorder: Continue home bupropion, duloxetine   Restless legs: Continue home ropinirole  DVT prophylaxis: lovenox Code Status: Full Family Communication: none at bedside  Disposition:   Status is: Inpatient    Consultants:  none   Subjective:  No headache, no chest pain, dyspnea has improved  Objective: Vitals:   11/25/22 2021 11/25/22 2319 11/26/22 0339 11/26/22 0841  BP: 128/69 110/83 (!) 145/85 128/80  Pulse: 93 85 94 85  Resp: 19 18 19 18   Temp: 97.8 F (36.6 C) 97.8 F (36.6 C) 97.9 F (36.6  C) (!) 97.5 F (36.4 C)  TempSrc: Oral Oral Oral Oral  SpO2: 96% 95% 92% 94%  Weight:      Height:        Intake/Output Summary (Last 24 hours) at 11/26/2022 1553 Last data filed at 11/26/2022 0958 Gross per 24 hour  Intake 483 ml  Output --  Net 483 ml   Filed Weights   11/23/22 1931  Weight: 115 kg    Examination:  Awake Alert, Oriented X 3, No new F.N deficits, Normal affect Symmetrical Chest  wall movement, air entry much improved with minimal wheezing today RRR,No Gallops,Rubs or new Murmurs, No Parasternal Heave +ve B.Sounds, Abd Soft, No tenderness, No rebound - guarding or rigidity. No Cyanosis, Clubbing or edema, No new Rash or bruise       Data Reviewed: I have personally reviewed following labs and imaging studies  CBC: Recent Labs  Lab 11/23/22 1200 11/23/22 1600 11/23/22 1937 11/24/22 0237 11/25/22 0400 11/26/22 0638  WBC 12.1* 11.2*  --  9.1 16.9* 14.9*  NEUTROABS 7.6  --   --   --   --   --   HGB 13.2 13.3 13.6 13.6 13.4 13.5  HCT 41.1 40.6 40.0 41.5 40.3 40.3  MCV 94.2 93.8  --  94.1 92.9 92.9  PLT 322.0 319  --  327 374 385    Basic Metabolic Panel: Recent Labs  Lab 11/23/22 1600 11/23/22 1937 11/23/22 2010 11/24/22 0340 11/25/22 0400 11/26/22 0638  NA 138 134*  --  134* 134* 135  K 3.7 5.2*  --  2.9* 4.1 3.9  CL 91*  --   --  92* 93* 98  CO2 32  --   --  23 28 28   GLUCOSE 324*  --   --  491* 302* 232*  BUN 23  --   --  18 26* 31*  CREATININE 1.38*  --   --  1.47* 1.23* 1.11*  CALCIUM 9.1  --   --  8.9 9.1 8.8*  MG  --   --  1.8 1.6* 2.0 2.2  PHOS  --   --   --  1.9*  --  3.7    GFR: Estimated Creatinine Clearance: 62.3 mL/min (A) (by C-G formula based on SCr of 1.11 mg/dL (H)).  Liver Function Tests: No results for input(s): "AST", "ALT", "ALKPHOS", "BILITOT", "PROT", "ALBUMIN" in the last 168 hours.  CBG: Recent Labs  Lab 11/25/22 1210 11/25/22 1622 11/25/22 2052 11/26/22 0840 11/26/22 1152  GLUCAP 331* 306* 309* 260* 331*     Recent Results (from the past 240 hour(s))  Resp panel by RT-PCR (RSV, Flu A&B, Covid) Anterior Nasal Swab     Status: None   Collection Time: 11/23/22  7:27 PM   Specimen: Anterior Nasal Swab  Result Value Ref Range Status   SARS Coronavirus 2 by RT PCR NEGATIVE NEGATIVE Final   Influenza A by PCR NEGATIVE NEGATIVE Final   Influenza B by PCR NEGATIVE NEGATIVE Final    Comment: (NOTE) The  Xpert Xpress SARS-CoV-2/FLU/RSV plus assay is intended as an aid in the diagnosis of influenza from Nasopharyngeal swab specimens and should not be used as a sole basis for treatment. Nasal washings and aspirates are unacceptable for Xpert Xpress SARS-CoV-2/FLU/RSV testing.  Fact Sheet for Patients: BloggerCourse.com  Fact Sheet for Healthcare Providers: SeriousBroker.it  This test is not yet approved or cleared by the [place] FDA and has been authorized for detection and/or diagnosis of SARS-CoV-2 by  FDA under an Emergency Use Authorization (EUA). This EUA will remain in effect (meaning this test can be used) for the duration of the COVID-19 declaration under Section 564(b)(1) of the Act, 21 U.S.C. section 360bbb-3(b)(1), unless the authorization is terminated or revoked.     Resp Syncytial Virus by PCR NEGATIVE NEGATIVE Final    Comment: (NOTE) Fact Sheet for Patients: BloggerCourse.com  Fact Sheet for Healthcare Providers: SeriousBroker.it  This test is not yet approved or cleared by the [place] FDA and has been authorized for detection and/or diagnosis of SARS-CoV-2 by FDA under an Emergency Use Authorization (EUA). This EUA will remain in effect (meaning this test can be used) for the duration of the COVID-19 declaration under Section 564(b)(1) of the Act, 21 U.S.C. section 360bbb-3(b)(1), unless the authorization is terminated or revoked.  Performed at [facility], [address], [place], [place] [address]   Expectorated Sputum Assessment w Gram Stain, Rflx to Resp Cult     Status: None   Collection Time: 11/23/22  9:18 PM   Specimen: Sputum  Result Value Ref Range Status   Specimen Description SPUTUM  Final   Special Requests NONE  Final   Sputum evaluation   Final    THIS SPECIMEN IS ACCEPTABLE FOR SPUTUM CULTURE Performed at [facility]  Lab, [address] [place], [place] [address]    Report Status 11/25/2022 FINAL  Final  Culture, Respiratory w Gram Stain     Status: None (Preliminary result)   Collection Time: 11/23/22  9:18 PM   Specimen: SPU  Result Value Ref Range Status   Specimen Description SPUTUM  Final   Special Requests NONE Reflexed from [id number]  Final   Gram Stain   Final    RARE WBC PRESENT,BOTH PMN AND MONONUCLEAR RARE GRAM POSITIVE RODS    Culture   Final    CULTURE REINCUBATED FOR BETTER GROWTH Performed at [facility], [address], [place], [place] [address]    Report Status PENDING  Incomplete  Respiratory (~20 pathogens) panel by PCR     Status: Abnormal   Collection Time: 11/24/22  2:37 AM   Specimen: Nasopharyngeal Swab; Respiratory  Result Value Ref Range Status   Adenovirus NOT DETECTED NOT DETECTED Final   Coronavirus 229E NOT DETECTED NOT DETECTED Final    Comment: (NOTE) The Coronavirus on the Respiratory Panel, DOES NOT test for the novel  Coronavirus (2019 nCoV)    Coronavirus HKU1 NOT DETECTED NOT DETECTED Final   Coronavirus NL63 NOT DETECTED NOT DETECTED Final   Coronavirus OC43 NOT DETECTED NOT DETECTED Final   Metapneumovirus NOT DETECTED NOT DETECTED Final   Rhinovirus / Enterovirus DETECTED (A) NOT DETECTED Final   Influenza A NOT DETECTED NOT DETECTED Final   Influenza B NOT DETECTED NOT DETECTED Final   Parainfluenza Virus 1 NOT DETECTED NOT DETECTED Final   Parainfluenza Virus 2 NOT DETECTED NOT DETECTED Final   Parainfluenza Virus 3 NOT DETECTED NOT DETECTED Final   Parainfluenza Virus 4 NOT DETECTED NOT DETECTED Final   Respiratory Syncytial Virus NOT DETECTED NOT DETECTED Final   Bordetella pertussis NOT DETECTED NOT DETECTED Final   Bordetella Parapertussis NOT DETECTED NOT DETECTED Final   Chlamydophila pneumoniae NOT DETECTED NOT DETECTED Final   Mycoplasma pneumoniae NOT DETECTED NOT DETECTED Final    Comment: Performed at [facility], [address], [place], [place] [address]         Radiology Studies: No results  found.      Scheduled Meds:  aspirin EC  81 mg Oral Daily   buPROPion  300 mg Oral q morning   doxycycline  100 mg Oral Q12H   DULoxetine  60 mg Oral QHS   enoxaparin (LOVENOX) injection  50 mg Subcutaneous Q24H   ferrous sulfate  325 mg Oral QODAY   furosemide  60 mg Oral BID   gabapentin  300 mg Oral QHS   guaiFENesin  600 mg Oral BID   influenza vaccine adjuvanted  0.5 mL Intramuscular Tomorrow-1000   insulin aspart  0-20 Units Subcutaneous TID WC   insulin aspart  0-5 Units Subcutaneous QHS   insulin aspart  5 Units Subcutaneous TID WC   insulin glargine-yfgn  75 Units Subcutaneous BID   ipratropium-albuterol  3 mL Nebulization TID   magnesium oxide  400 mg Oral BID   methylPREDNISolone (SOLU-MEDROL) injection  40 mg Intravenous Q12H   pantoprazole  40 mg Oral q morning   rOPINIRole  4 mg Oral QPM   sodium chloride flush  3 mL Intravenous Q12H   Continuous Infusions:  cefTRIAXone (ROCEPHIN)  IV 1 g (11/25/22 1749)     LOS: 3 days        Huey Bienenstock, MD [facility]   To contact the attending provider between 7A-7P or the covering provider during after hours 7P-7A, please log into the web site [email or web address] and access using universal [place] password for that web site. If you do not have the password, please call the hospital operator.  11/26/2022, 3:53 PM

## 2022-11-27 DIAGNOSIS — J441 Chronic obstructive pulmonary disease with (acute) exacerbation: Secondary | ICD-10-CM | POA: Diagnosis not present

## 2022-11-27 LAB — CBC
HCT: 39.2 % (ref 36.0–46.0)
Hemoglobin: 12.9 g/dL (ref 12.0–15.0)
MCH: 30.9 pg (ref 26.0–34.0)
MCHC: 32.9 g/dL (ref 30.0–36.0)
MCV: 93.8 fL (ref 80.0–100.0)
Platelets: 365 10*3/uL (ref 150–400)
RBC: 4.18 MIL/uL (ref 3.87–5.11)
RDW: 14.2 % (ref 11.5–15.5)
WBC: 13 10*3/uL — ABNORMAL HIGH (ref 4.0–10.5)
nRBC: 0 % (ref 0.0–0.2)

## 2022-11-27 LAB — BASIC METABOLIC PANEL
Anion gap: 11 (ref 5–15)
BUN: 32 mg/dL — ABNORMAL HIGH (ref 8–23)
CO2: 31 mmol/L (ref 22–32)
Calcium: 9.1 mg/dL (ref 8.9–10.3)
Chloride: 97 mmol/L — ABNORMAL LOW (ref 98–111)
Creatinine, Ser: 1.18 mg/dL — ABNORMAL HIGH (ref 0.44–1.00)
GFR, Estimated: 51 mL/min — ABNORMAL LOW (ref >60)
Glucose, Bld: 221 mg/dL — ABNORMAL HIGH (ref 70–99)
Potassium: 4.5 mmol/L (ref 3.5–5.1)
Sodium: 139 mmol/L (ref 135–145)

## 2022-11-27 LAB — CULTURE, RESPIRATORY W GRAM STAIN: Culture: NORMAL

## 2022-11-27 LAB — GLUCOSE, CAPILLARY
Glucose-Capillary: 208 mg/dL — ABNORMAL HIGH (ref 70–99)
Glucose-Capillary: 275 mg/dL — ABNORMAL HIGH (ref 70–99)
Glucose-Capillary: 365 mg/dL — ABNORMAL HIGH (ref 70–99)
Glucose-Capillary: 151 mg/dL — ABNORMAL HIGH (ref 70–99)

## 2022-11-27 LAB — PHOSPHORUS: Phosphorus: 3.2 mg/dL (ref 2.5–4.6)

## 2022-11-27 MED ORDER — PREDNISONE 20 MG PO TABS
40.0000 mg | ORAL_TABLET | Freq: Every day | ORAL | Status: DC
  Administered 2022-11-28 – 2022-11-30 (×3): 40 mg via ORAL
  Filled 2022-11-27 (×4): qty 2

## 2022-11-27 MED ORDER — INSULIN GLARGINE-YFGN 100 UNIT/ML ~~LOC~~ SOLN
80.0000 [IU] | Freq: Two times a day (BID) | SUBCUTANEOUS | Status: DC
  Administered 2022-11-27 (×2): 80 [IU] via SUBCUTANEOUS
  Filled 2022-11-27 (×4): qty 0.8

## 2022-11-27 MED ORDER — FUROSEMIDE 10 MG/ML IJ SOLN
40.0000 mg | Freq: Once | INTRAMUSCULAR | Status: DC
Start: 2022-11-27 — End: 2022-11-27

## 2022-11-27 MED ORDER — GUAIFENESIN-DM 100-10 MG/5ML PO SYRP
5.0000 mL | ORAL_SOLUTION | Freq: Four times a day (QID) | ORAL | Status: DC | PRN
  Administered 2022-11-27 – 2022-11-28 (×3): 5 mL via ORAL
  Filled 2022-11-27 (×6): qty 5

## 2022-11-27 NOTE — Progress Notes (Signed)
Physical Therapy Treatment Patient Details Name: Kathleen Hatfield MRN: [id number] DOB: 06/11/1955 Today's Date: 11/27/2022   History of Present Illness This 67 y.o. female presented to ED on 11/23/22 with SOB and cough and productive sputum difficulty lying flat in bed.  Respiratory panel showed rhinovirus. Blood sugars also running high likely due to steriod use.  PMH includes:  CKD stage III, chronic diastolic CHF, COPD with asthma, depression, insulin-dependent type 2 diabetes, GERD, hyperlipidemia, hypertension, RLS, OSA on CPAP, ulcerative colitis, peripheral neuropathy, morbid obesity.    PT Comments  Patient able to complete single step up with bil UE support via RW x 7 reps prior to needing a seated rest. Sats 90-94% on 2L. After ~4 minutes, pt able to complete 3 more reps. Sats dropped to 87% on 2L with quick recovery once seated. Instructed in basic LE exercises and pt tolerated well from an oxygen saturation stand point. Patient has met stair goal, supine to sit, and sit to stand goals. Will continue to work toward modified independence with ambulation.    If plan is discharge home, recommend the following: A little help with walking and/or transfers;Help with stairs or ramp for entrance;A lot of help with bathing/dressing/bathroom;Assistance with cooking/housework   Can travel by private vehicle        Equipment Recommendations  None recommended by PT    Recommendations for Other Services       Precautions / Restrictions Precautions Precautions: Fall Precaution Comments: O2 dependent     Mobility  Bed Mobility               General bed mobility comments: in recliner    Transfers Overall transfer level: Needs assistance   Transfers: Sit to/from Stand, Bed to chair/wheelchair/BSC Sit to Stand: Modified independent (Device/Increase time)   Step pivot transfers: Modified independent (Device/Increase time)       General transfer comment: from recliner and EOB     Ambulation/Gait                   Stairs Stairs: Yes Stairs assistance: Supervision Stair Management: Step to pattern, With walker Number of Stairs: 1 (x7 reps; x 3 reps) General stair comments: single step brought to room; RW placed over step so pt could hold onto it (pt preference instead of elevating bed and using bed rail); initial reps sats 90-94% on 2L; seated rest; second set sats down to 87% (pt talking while stepping)   Wheelchair Mobility     Tilt Bed    Modified Rankin (Stroke Patients Only)       Balance Overall balance assessment: Needs assistance   Sitting balance-Leahy Scale: Good       Standing balance-Leahy Scale: Fair                              Cognition Arousal: Alert Behavior During Therapy: WFL for tasks assessed/performed Overall Cognitive Status: Within Functional Limits for tasks assessed                                          Exercises General Exercises - Lower Extremity Heel Raises: AROM, Both, 10 reps, Seated Mini-Sqauts: AROM, Both, 10 reps, Standing    General Comments        Pertinent Vitals/Pain Pain Assessment Pain Assessment: Faces Faces Pain Scale: Hurts little more Pain  Location: L knee with mini-squats Pain Descriptors / Indicators: Aching Pain Intervention(s): Limited activity within patient's tolerance, Monitored during session    Home Living                          Prior Function            PT Goals (current goals can now be found in the care plan section) Acute Rehab PT Goals Patient Stated Goal: breathing better Time For Goal Achievement: 12/08/22 Potential to Achieve Goals: Good Progress towards PT goals: Progressing toward goals    Frequency    Min 1X/week      PT Plan      Co-evaluation              AM-PAC PT "6 Clicks" Mobility   Outcome Measure  Help needed turning from your back to your side while in a flat bed without using  bedrails?: None Help needed moving from lying on your back to sitting on the side of a flat bed without using bedrails?: None Help needed moving to and from a bed to a chair (including a wheelchair)?: None Help needed standing up from a chair using your arms (e.g., wheelchair or bedside chair)?: None Help needed to walk in hospital room?: A Little Help needed climbing 3-5 steps with a railing? : A Lot 6 Click Score: 21    End of Session Equipment Utilized During Treatment: Oxygen Activity Tolerance: Patient limited by fatigue Patient left: in chair;with call bell/phone within reach   PT Visit Diagnosis: Muscle weakness (generalized) (M62.81);Difficulty in walking, not elsewhere classified (R26.2)     Time: 4010-2725 PT Time Calculation (min) (ACUTE ONLY): 25 min  Charges:    $Gait Training: 8-22 mins $Therapeutic Exercise: 8-22 mins PT General Charges $$ ACUTE PT VISIT: 1 Visit                      Jerolyn Center, PT Acute Rehabilitation Services  Office [phone]    Zena Amos 11/27/2022, 1:07 PM

## 2022-11-27 NOTE — Plan of Care (Signed)
  Problem: Education: Goal: Knowledge of disease or condition will improve Outcome: Progressing   Problem: Health Behavior/Discharge Planning: Goal: Ability to manage health-related needs will improve Outcome: Progressing   Problem: Nutritional: Goal: Maintenance of adequate nutrition will improve Outcome: Progressing   Problem: Pain Managment: Goal: General experience of comfort will improve Outcome: Progressing

## 2022-11-27 NOTE — Progress Notes (Signed)
PROGRESS NOTE    [name]  ZOX:[id number] DOB: Feb 06, 1956 DOA: 11/23/2022 PCP: Joaquim Nam, MD   Chief Complaint  Patient presents with   Shortness of Breath   Chest Pain    Brief Narrative:   Kathleen Hatfield is a 67 y.o. female with past medical history of COPD, congestive heart failure on oxygen at baseline,  CKD stage III, morbid obesity, sleep apnea on CPAP, diabetes, ulcerative colitis, presented to the hospital with worsening dyspnea cough and productive sputum with difficulty lying flat in bed.  She was taking her inhalers without any relief so she decided come to the hospital.  Initial labs showed WBC elevated at 11.2.  Creatinine at 1.3.  COVID and influenza was negative.  Respiratory viral panel however showed rhinovirus.  Patient was then admitted to the hospital for further evaluation and treatment.   Assessment & Plan:   Active Problems:   Mixed diabetic hyperlipidemia associated with type 2 diabetes mellitus (HCC)   Essential hypertension   Hyperlipidemia   OSA on CPAP   (HFpEF) heart failure with preserved ejection fraction (HCC)   COPD (chronic obstructive pulmonary disease) (HCC)   RLS (restless legs syndrome)   Diabetes mellitus (HCC)   COPD exacerbation (HCC)   Chronic hypoxic respiratory failure (HCC)   Rhinovirus infection   COPD exacerbation Acute on chronic hypoxic respiratory failure Rhinovirus infection. Patient is on 2 L of oxygen at baseline but initially required BiPAP which was subsequently discontinued. - On 4 L nasal cannula, currently back to baseline on 2 L oxygen. - Continue with IV steroids for COPD exacerbation, this is improving, will transition to p.o. prednisone tomorrow-   -Continue with IV antibiotics, given productive cough with dark phlegm  - she was encouraged with incentive spirometry, flutter valve, out of bed to chair, she remains symptomatic with significant dyspnea with minimal activity. - Respiratory viral panel has  resulted in rhinoviral infection at this time.     Demand ischemia. Secondary respiratory failure.  EKG with RBBB, no acute ischemic changes, high-sensitivity Trope downtrending 28-26.     Hypokalemia.   -Replaced   hyponatremia mild Mild.    Hypomagnesemia.  replcaed   Hypophosphatemia.  replaced   Diabetes type 2 with hyperglycemia:  -Tresiba and sliding scale at home.   -CBG remains significantly uncontrolled, her A1c at baseline 12.9 last month, is 11.2 since admission, BG remains significantly elevated, so her statin has been increased to 8 units twice daily, continue with sliding scale .she will need to be discharged on insulin twice daily regimen given large dose requirement (She is on Tresiba 110 units daily at home)  -Was seen by both nutritionist and diabetic coordinator   HFpEF, without acute exacerbation Cardiac MRI in 2021 with preserved EF.  In' 21 with preserved EF.  Continue home Lasix 60 daily.  On as needed midodrine as needed at home.      ASCVD, hyperlipidemia: Continue aspirin 81 mg daily.  On PCSK9 as outpatient.   CKD 3: Creatinine baseline approximately 1.3.  With check levels in AM.   Chronic pain: Continue  tramadol as needed.   Mood disorder: Continue home bupropion, duloxetine   Restless legs: Continue home ropinirole  DVT prophylaxis: lovenox Code Status: Full Family Communication: none at bedside  Disposition:   Status is: Inpatient    Consultants:  none   Subjective:  No headache, no chest pain, dyspnea has improved  Objective: Vitals:   11/26/22 2200 11/27/22 0016 11/27/22 0420  11/27/22 0745  BP:  124/62 133/71 134/68  Pulse: 86 88 78 75  Resp: (!) 21 20 19 17   Temp:  97.6 F (36.4 C) 98.1 F (36.7 C)   TempSrc:  Oral Oral   SpO2: 95% 94% 96%   Weight:      Height:        Intake/Output Summary (Last 24 hours) at 11/27/2022 1249 Last data filed at 11/27/2022 0901 Gross per 24 hour  Intake 3 ml  Output --  Net 3 ml    Filed Weights   11/23/22 1931  Weight: 115 kg    Examination:  Awake Alert, Oriented X 3, No new F.N deficits, Normal affect Symmetrical Chest wall movement, Good air movement bilaterally, CTAB RRR,No Gallops,Rubs or new Murmurs, No Parasternal Heave +ve B.Sounds, Abd Soft, No tenderness, No rebound - guarding or rigidity. No Cyanosis, Clubbing or edema, No new Rash or bruise        Data Reviewed: I have personally reviewed following labs and imaging studies  CBC: Recent Labs  Lab 11/23/22 1200 11/23/22 1600 11/23/22 1937 11/24/22 0237 11/25/22 0400 11/26/22 0638 11/27/22 0749  WBC 12.1* 11.2*  --  9.1 16.9* 14.9* 13.0*  NEUTROABS 7.6  --   --   --   --   --   --   HGB 13.2 13.3 13.6 13.6 13.4 13.5 12.9  HCT 41.1 40.6 40.0 41.5 40.3 40.3 39.2  MCV 94.2 93.8  --  94.1 92.9 92.9 93.8  PLT 322.0 319  --  327 374 385 365    Basic Metabolic Panel: Recent Labs  Lab 11/23/22 1600 11/23/22 1937 11/23/22 2010 11/24/22 0340 11/25/22 0400 11/26/22 0638 11/27/22 0749  NA 138 134*  --  134* 134* 135 139  K 3.7 5.2*  --  2.9* 4.1 3.9 4.5  CL 91*  --   --  92* 93* 98 97*  CO2 32  --   --  23 28 28 31   GLUCOSE 324*  --   --  491* 302* 232* 221*  BUN 23  --   --  18 26* 31* 32*  CREATININE 1.38*  --   --  1.47* 1.23* 1.11* 1.18*  CALCIUM 9.1  --   --  8.9 9.1 8.8* 9.1  MG  --   --  1.8 1.6* 2.0 2.2  --   PHOS  --   --   --  1.9*  --  3.7 3.2    GFR: Estimated Creatinine Clearance: 58.6 mL/min (A) (by C-G formula based on SCr of 1.18 mg/dL (H)).  Liver Function Tests: No results for input(s): "AST", "ALT", "ALKPHOS", "BILITOT", "PROT", "ALBUMIN" in the last 168 hours.  CBG: Recent Labs  Lab 11/26/22 1748 11/26/22 1817 11/26/22 2111 11/27/22 0751 11/27/22 1243  GLUCAP 281* 290* 280* 208* 275*     Recent Results (from the past 240 hour(s))  Resp panel by RT-PCR (RSV, Flu A&B, Covid) Anterior Nasal Swab     Status: None   Collection Time: 11/23/22  7:27  PM   Specimen: Anterior Nasal Swab  Result Value Ref Range Status   SARS Coronavirus 2 by RT PCR NEGATIVE NEGATIVE Final   Influenza A by PCR NEGATIVE NEGATIVE Final   Influenza B by PCR NEGATIVE NEGATIVE Final    Comment: (NOTE) The Xpert Xpress SARS-CoV-2/FLU/RSV plus assay is intended as an aid in the diagnosis of influenza from Nasopharyngeal swab specimens and should not be used as a sole basis for treatment.  Nasal washings and aspirates are unacceptable for Xpert Xpress SARS-CoV-2/FLU/RSV testing.  Fact Sheet for Patients: BloggerCourse.com  Fact Sheet for Healthcare Providers: SeriousBroker.it  This test is not yet approved or cleared by the [place] FDA and has been authorized for detection and/or diagnosis of SARS-CoV-2 by FDA under an Emergency Use Authorization (EUA). This EUA will remain in effect (meaning this test can be used) for the duration of the COVID-19 declaration under Section 564(b)(1) of the Act, 21 U.S.C. section 360bbb-3(b)(1), unless the authorization is terminated or revoked.     Resp Syncytial Virus by PCR NEGATIVE NEGATIVE Final    Comment: (NOTE) Fact Sheet for Patients: BloggerCourse.com  Fact Sheet for Healthcare Providers: SeriousBroker.it  This test is not yet approved or cleared by the [place] FDA and has been authorized for detection and/or diagnosis of SARS-CoV-2 by FDA under an Emergency Use Authorization (EUA). This EUA will remain in effect (meaning this test can be used) for the duration of the COVID-19 declaration under Section 564(b)(1) of the Act, 21 U.S.C. section 360bbb-3(b)(1), unless the authorization is terminated or revoked.  Performed at [facility], [address], [place], [place] [address]   Expectorated Sputum Assessment w Gram Stain, Rflx to Resp Cult     Status: None   Collection Time: 11/23/22   9:18 PM   Specimen: Sputum  Result Value Ref Range Status   Specimen Description SPUTUM  Final   Special Requests NONE  Final   Sputum evaluation   Final    THIS SPECIMEN IS ACCEPTABLE FOR SPUTUM CULTURE Performed at [facility], [address], [place], [place] [address]    Report Status 11/25/2022 FINAL  Final  Culture, Respiratory w Gram Stain     Status: None   Collection Time: 11/23/22  9:18 PM   Specimen: SPU  Result Value Ref Range Status   Specimen Description SPUTUM  Final   Special Requests NONE Reflexed from [id number]  Final   Gram Stain   Final    RARE WBC PRESENT,BOTH PMN AND MONONUCLEAR RARE GRAM POSITIVE RODS    Culture   Final    Consistent with normal respiratory flora. No Pseudomonas species isolated Performed at [facility], [address], [place], [place] [address]    Report Status 11/27/2022 FINAL  Final  Respiratory (~20 pathogens) panel by PCR     Status: Abnormal   Collection Time: 11/24/22  2:37 AM   Specimen: Nasopharyngeal Swab; Respiratory  Result Value Ref Range Status   Adenovirus NOT DETECTED NOT DETECTED Final   Coronavirus 229E NOT DETECTED NOT DETECTED Final    Comment: (NOTE) The Coronavirus on the Respiratory Panel, DOES NOT test for the novel  Coronavirus (2019 nCoV)    Coronavirus HKU1 NOT DETECTED NOT DETECTED Final   Coronavirus NL63 NOT DETECTED NOT DETECTED Final   Coronavirus OC43 NOT DETECTED NOT DETECTED Final   Metapneumovirus NOT DETECTED NOT DETECTED Final   Rhinovirus / Enterovirus DETECTED (A) NOT DETECTED Final   Influenza A NOT DETECTED NOT DETECTED Final   Influenza B NOT DETECTED NOT DETECTED Final   Parainfluenza Virus 1 NOT DETECTED NOT DETECTED Final   Parainfluenza Virus 2 NOT DETECTED NOT DETECTED Final   Parainfluenza Virus 3 NOT DETECTED NOT DETECTED Final   Parainfluenza Virus 4 NOT DETECTED NOT DETECTED Final   Respiratory Syncytial Virus NOT DETECTED NOT DETECTED Final   Bordetella pertussis  NOT DETECTED NOT DETECTED Final   Bordetella Parapertussis  NOT DETECTED NOT DETECTED Final   Chlamydophila pneumoniae NOT DETECTED NOT DETECTED Final   Mycoplasma pneumoniae NOT DETECTED NOT DETECTED Final    Comment: Performed at [facility], [address], [place], [place] [address]         Radiology Studies: No results found.      Scheduled Meds:  aspirin EC  81 mg Oral Daily   buPROPion  300 mg Oral q morning   doxycycline  100 mg Oral Q12H   DULoxetine  60 mg Oral QHS   enoxaparin (LOVENOX) injection  50 mg Subcutaneous Q24H   ferrous sulfate  325 mg Oral QODAY   furosemide  40 mg Intravenous Once   furosemide  60 mg Oral BID   gabapentin  300 mg Oral QHS   guaiFENesin  600 mg Oral BID   influenza vaccine adjuvanted  0.5 mL Intramuscular Tomorrow-1000   insulin aspart  0-20 Units Subcutaneous TID WC   insulin aspart  0-5 Units Subcutaneous QHS   insulin aspart  8 Units Subcutaneous TID WC   insulin glargine-yfgn  80 Units Subcutaneous BID   ipratropium-albuterol  3 mL Nebulization TID   magnesium oxide  400 mg Oral BID   methylPREDNISolone (SOLU-MEDROL) injection  40 mg Intravenous Q12H   pantoprazole  40 mg Oral q morning   rOPINIRole  4 mg Oral QPM   sodium chloride flush  3 mL Intravenous Q12H   Continuous Infusions:  cefTRIAXone (ROCEPHIN)  IV 1 g (11/26/22 1808)     LOS: 4 days        Huey Bienenstock, MD [facility]   To contact the attending provider between 7A-7P or the covering provider during after hours 7P-7A, please log into the web site [email or web address] and access using universal [place] password for that web site. If you do not have the password, please call the hospital operator.  11/27/2022, 12:49 PM

## 2022-11-27 NOTE — Progress Notes (Signed)
Mobility Specialist Progress Note;   11/27/22 1405  Mobility  Activity Ambulated with assistance in hallway  Level of Assistance Standby assist, set-up cues, supervision of patient - no hands on  Assistive Device Front wheel walker  Distance Ambulated (ft) 100 ft  Activity Response Tolerated fair (SOB, dizziness)  Mobility Referral Yes  $Mobility charge 1 Mobility  Mobility Specialist Start Time (ACUTE ONLY) 1405  Mobility Specialist Stop Time (ACUTE ONLY) 1425  Mobility Specialist Time Calculation (min) (ACUTE ONLY) 20 min    Pre-mobility:  During-mobility: SPO2 87-92% 2LO2 Post-mobility: SPO2 92% 2LO2  Pt agreeable to mobility. Required no physical assistance, SV. Pt displayed SOB and dizziness/shakiness during ambulation. When asked, rated RPE "11 out of 10". Required incr O2 to 3L for ~1 min to recover [facility]. Took multiple standing rest breaks due to SOB. Pt left in chair with all needs met.  Caesar Bookman Mobility Specialist Please contact via SecureChat or Rehab Office [phone]

## 2022-11-27 NOTE — Inpatient Diabetes Management (Signed)
Inpatient Diabetes Program Recommendations  AACE/ADA: New Consensus Statement on Inpatient Glycemic Control (2015)  Target Ranges:  Prepandial:   less than 140 mg/dL      Peak postprandial:   less than 180 mg/dL (1-2 hours)      Critically ill patients:  140 - 180 mg/dL   Lab Results  Component Value Date   GLUCAP 208 (H) 11/27/2022   HGBA1C 11.2 (H) 11/26/2022    Review of Glycemic Control  Diabetes history: type 2 Outpatient Diabetes medications: Tresiba 110 units daily, Novolog 1-8 units scale TID, Ozempic 2 mg weekly Current orders for Inpatient glycemic control: Semglee 80 units BID, Novolog 0-20 units correction scale TID, Novolog 0-5 units HS scale, Novolog 8 units TID with meals  Inpatient Diabetes Program Recommendations:   Spoke with patient at the bedside. Was diagnosed with diabetes about 7 years ago. Has been taking Tresiba 110 units daily for about 6 weeks. She titrated her own dose up to 110 units from starting at 98 units daily. Blood sugars at home with her Dexcom G7 CGM have been generally around 270 mg/dl on an average. Thus, shows that her latest hgbA1C has been 11.2%, down from 12.9% in July, 2024. Last saw Dr. Lonzo Cloud at [facility] endocrinology about 3 months ago. Has also been taking Ozempic 2 mg weekly for about 8 months to help bring her A1C down. States that she has not lost any weight with it.   Discussed her HgbA1C of 11.2%. suggested to her that she divide her dose of Tresiba in 2 doses, especially for absorption purposes. Reviewed rotating sites for insulin injections, especially since she does take several injections per day. She states that she had been giving her injections only in her abdomen. States that she has so many meds to take, she needs to organize all her meds and insulin so that she does not forget to take them.   Recommend that she use her Evaristo Bury that was prescribed, but divide dosages into 2 doses if taking large amounts of units. She states  that the Novolog sliding scale that she uses for meals is very low dosed (Novolog 1-8 units TID). States that she never has low blood sugars, but that her blood sugars were closer to normal before she came in and stated that she felt weak. Suggested that it might be from having blood sugars too high and that her body was adjusting to the high sugars.   Will continue to monitor blood sugars while in the hospital. Suggested that she make an appointment with her endocrinologist after discharge, since she has been in the hospital.  Smith Mince RN BSN CDE Diabetes Coordinator Pager: [phone]  8am-5pm

## 2022-11-28 DIAGNOSIS — E1169 Type 2 diabetes mellitus with other specified complication: Secondary | ICD-10-CM | POA: Diagnosis not present

## 2022-11-28 DIAGNOSIS — E782 Mixed hyperlipidemia: Secondary | ICD-10-CM | POA: Diagnosis not present

## 2022-11-28 DIAGNOSIS — J441 Chronic obstructive pulmonary disease with (acute) exacerbation: Secondary | ICD-10-CM | POA: Diagnosis not present

## 2022-11-28 LAB — CBC
HCT: 42.2 % (ref 36.0–46.0)
Hemoglobin: 13.6 g/dL (ref 12.0–15.0)
MCH: 30.5 pg (ref 26.0–34.0)
MCHC: 32.2 g/dL (ref 30.0–36.0)
MCV: 94.6 fL (ref 80.0–100.0)
Platelets: 373 10*3/uL (ref 150–400)
RBC: 4.46 MIL/uL (ref 3.87–5.11)
RDW: 14.3 % (ref 11.5–15.5)
WBC: 19.2 10*3/uL — ABNORMAL HIGH (ref 4.0–10.5)
nRBC: 0 % (ref 0.0–0.2)

## 2022-11-28 LAB — BASIC METABOLIC PANEL
Anion gap: 12 (ref 5–15)
BUN: 28 mg/dL — ABNORMAL HIGH (ref 8–23)
CO2: 30 mmol/L (ref 22–32)
Calcium: 8.9 mg/dL (ref 8.9–10.3)
Chloride: 96 mmol/L — ABNORMAL LOW (ref 98–111)
Creatinine, Ser: 1.11 mg/dL — ABNORMAL HIGH (ref 0.44–1.00)
GFR, Estimated: 54 mL/min — ABNORMAL LOW (ref >60)
Glucose, Bld: 134 mg/dL — ABNORMAL HIGH (ref 70–99)
Potassium: 2.8 mmol/L — ABNORMAL LOW (ref 3.5–5.1)
Sodium: 138 mmol/L (ref 135–145)

## 2022-11-28 LAB — GLUCOSE, CAPILLARY
Glucose-Capillary: 148 mg/dL — ABNORMAL HIGH (ref 70–99)
Glucose-Capillary: 23 mg/dL — CL (ref 70–99)
Glucose-Capillary: 38 mg/dL — CL (ref 70–99)
Glucose-Capillary: 41 mg/dL — CL (ref 70–99)
Glucose-Capillary: 123 mg/dL — ABNORMAL HIGH (ref 70–99)
Glucose-Capillary: 231 mg/dL — ABNORMAL HIGH (ref 70–99)
Glucose-Capillary: 259 mg/dL — ABNORMAL HIGH (ref 70–99)
Glucose-Capillary: 174 mg/dL — ABNORMAL HIGH (ref 70–99)

## 2022-11-28 LAB — PHOSPHORUS: Phosphorus: 3.9 mg/dL (ref 2.5–4.6)

## 2022-11-28 MED ORDER — DEXTROSE 50 % IV SOLN
INTRAVENOUS | Status: AC
  Administered 2022-11-28: 50 mL
  Filled 2022-11-28: qty 50

## 2022-11-28 MED ORDER — POTASSIUM CHLORIDE CRYS ER 20 MEQ PO TBCR
40.0000 meq | EXTENDED_RELEASE_TABLET | Freq: Four times a day (QID) | ORAL | Status: AC
  Administered 2022-11-28 (×2): 40 meq via ORAL
  Filled 2022-11-28 (×2): qty 2

## 2022-11-28 MED ORDER — GLUCOSE 40 % PO GEL
ORAL | Status: AC
  Administered 2022-11-28: 37.5 g
  Filled 2022-11-28: qty 1.21

## 2022-11-28 MED ORDER — POTASSIUM CHLORIDE 10 MEQ/100ML IV SOLN
10.0000 meq | INTRAVENOUS | Status: AC
  Administered 2022-11-28 (×3): 10 meq via INTRAVENOUS
  Filled 2022-11-28 (×3): qty 100

## 2022-11-28 MED ORDER — ZOLPIDEM TARTRATE 5 MG PO TABS
5.0000 mg | ORAL_TABLET | Freq: Once | ORAL | Status: AC
  Administered 2022-11-28: 5 mg via ORAL
  Filled 2022-11-28: qty 1

## 2022-11-28 MED ORDER — INSULIN GLARGINE-YFGN 100 UNIT/ML ~~LOC~~ SOLN
55.0000 [IU] | Freq: Two times a day (BID) | SUBCUTANEOUS | Status: DC
  Administered 2022-11-28: 55 [IU] via SUBCUTANEOUS
  Filled 2022-11-28 (×3): qty 0.55

## 2022-11-28 NOTE — Progress Notes (Signed)
PROGRESS NOTE    Kathleen Hatfield  KXF:[id number] DOB: 1955-12-26 DOA: 11/23/2022 PCP: Joaquim Nam, MD   Chief Complaint  Patient presents with   Shortness of Breath   Chest Pain    Brief Narrative:   Kathleen Hatfield is a 67 y.o. female with past medical history of COPD, congestive heart failure on oxygen at baseline,  CKD stage III, morbid obesity, sleep apnea on CPAP, diabetes, ulcerative colitis, presented to the hospital with worsening dyspnea cough and productive sputum with difficulty lying flat in bed.  She was taking her inhalers without any relief so she decided come to the hospital.  Initial labs showed WBC elevated at 11.2.  Creatinine at 1.3.  COVID and influenza was negative.  Respiratory viral panel however showed rhinovirus.  Patient was then admitted to the hospital for further evaluation and treatment.   Assessment & Plan:   Active Problems:   Mixed diabetic hyperlipidemia associated with type 2 diabetes mellitus (HCC)   Essential hypertension   Hyperlipidemia   OSA on CPAP   (HFpEF) heart failure with preserved ejection fraction (HCC)   COPD (chronic obstructive pulmonary disease) (HCC)   RLS (restless legs syndrome)   Diabetes mellitus (HCC)   COPD exacerbation (HCC)   Chronic hypoxic respiratory failure (HCC)   Rhinovirus infection   COPD exacerbation Acute on chronic hypoxic respiratory failure Rhinovirus infection. Patient is on 2 L of oxygen at baseline but initially required BiPAP which was subsequently discontinued. - On 4 L nasal cannula, currently back to baseline on 2 L oxygen. -Still with IV steroids, wheezing much improved, she is transition to p.o. prednisone. -Continue with IV antibiotics, given productive cough with dark phlegm, this can be discontinued at time of discharge. - she was encouraged with incentive spirometry, flutter valve, out of bed to chair, she remains symptomatic with significant dyspnea with minimal activity. - Respiratory viral  panel has resulted in rhinoviral infection at this time.     Demand ischemia. Secondary respiratory failure.  EKG with RBBB, no acute ischemic changes, high-sensitivity Trope downtrending 28-26.     Hypokalemia.   -Replaced   hyponatremia mild Mild.    Hypomagnesemia.  replcaed   Hypophosphatemia.  replaced   Diabetes type 2 with hyperglycemia:  -Tresiba and sliding scale at home.   -CBG remains significantly uncontrolled, her A1c at baseline 12.9 last month, is 11.2 since admission, BG remains significantly elevated, so her statin has been increased to 8 units twice daily, continue with sliding scale .she will need to be discharged on insulin twice daily regimen given large dose requirement (She is on Tresiba 110 units daily at home)  -Was seen by both nutritionist and diabetic coordinator -Patient with hypoglycemia this a.m. at 23, her long-acting insulin has been held this morning, she received D50, will hold her morning Semglee, and will decrease of neck dose to 55 units twice daily.   HFpEF, without acute exacerbation Cardiac MRI in 2021 with preserved EF.  In' 21 with preserved EF.  Continue home Lasix 60 daily.  On as needed midodrine as needed at home.      ASCVD, hyperlipidemia: Continue aspirin 81 mg daily.  On PCSK9 as outpatient.   CKD 3: Creatinine baseline approximately 1.3.  With check levels in AM.   Chronic pain: Continue  tramadol as needed.   Mood disorder: Continue home bupropion, duloxetine   Restless legs: Continue home ropinirole  DVT prophylaxis: lovenox Code Status: Full Family Communication: none at bedside  Disposition:   Status is: Inpatient    Consultants:  none   Subjective:  No headache, no chest pain, dyspnea has improved, she had hypoglycemia this morning at 23, she was symptomatic, confused, dizzy and lightheaded, this resolved after receiving D50  Objective: Vitals:   11/27/22 2300 11/28/22 0300 11/28/22 0728 11/28/22 0803  BP:  138/76 (!) 154/96  139/88  Pulse: 81 73  77  Resp: 18 19  19   Temp: 98 F (36.7 C) 98 F (36.7 C)    TempSrc: Oral Oral    SpO2: 93% (!) 88% 94% 91%  Weight:      Height:        Intake/Output Summary (Last 24 hours) at 11/28/2022 1113 Last data filed at 11/28/2022 1106 Gross per 24 hour  Intake 240 ml  Output 0 ml  Net 240 ml   Filed Weights   11/23/22 1931  Weight: 115 kg    Examination:  Awake Alert, Oriented X 3, No new F.N deficits, Normal affect Symmetrical Chest wall movement, Good air movement bilaterally, scattered Rales, air entry improved. RRR,No Gallops,Rubs or new Murmurs, No Parasternal Heave +ve B.Sounds, Abd Soft, No tenderness, No rebound - guarding or rigidity. No Cyanosis, Clubbing or edema, No new Rash or bruise         Data Reviewed: I have personally reviewed following labs and imaging studies  CBC: Recent Labs  Lab 11/23/22 1200 11/23/22 1600 11/24/22 0237 11/25/22 0400 11/26/22 0638 11/27/22 0749 11/28/22 0809  WBC 12.1*   < > 9.1 16.9* 14.9* 13.0* 19.2*  NEUTROABS 7.6  --   --   --   --   --   --   HGB 13.2   < > 13.6 13.4 13.5 12.9 13.6  HCT 41.1   < > 41.5 40.3 40.3 39.2 42.2  MCV 94.2   < > 94.1 92.9 92.9 93.8 94.6  PLT 322.0   < > 327 374 385 365 373   < > = values in this interval not displayed.    Basic Metabolic Panel: Recent Labs  Lab 11/23/22 2010 11/24/22 0340 11/25/22 0400 11/26/22 0638 11/27/22 0749 11/28/22 0809  NA  --  134* 134* 135 139 138  K  --  2.9* 4.1 3.9 4.5 2.8*  CL  --  92* 93* 98 97* 96*  CO2  --  23 28 28 31 30   GLUCOSE  --  491* 302* 232* 221* 134*  BUN  --  18 26* 31* 32* 28*  CREATININE  --  1.47* 1.23* 1.11* 1.18* 1.11*  CALCIUM  --  8.9 9.1 8.8* 9.1 8.9  MG 1.8 1.6* 2.0 2.2  --   --   PHOS  --  1.9*  --  3.7 3.2 3.9    GFR: Estimated Creatinine Clearance: 62.3 mL/min (A) (by C-G formula based on SCr of 1.11 mg/dL (H)).  Liver Function Tests: No results for input(s): "AST", "ALT",  "ALKPHOS", "BILITOT", "PROT", "ALBUMIN" in the last 168 hours.  CBG: Recent Labs  Lab 11/28/22 0738 11/28/22 0743 11/28/22 0800 11/28/22 0828 11/28/22 0833  GLUCAP 23* 38* 41* 148* 123*     Recent Results (from the past 240 hour(s))  Resp panel by RT-PCR (RSV, Flu A&B, Covid) Anterior Nasal Swab     Status: None   Collection Time: 11/23/22  7:27 PM   Specimen: Anterior Nasal Swab  Result Value Ref Range Status   SARS Coronavirus 2 by RT PCR NEGATIVE NEGATIVE Final   Influenza  A by PCR NEGATIVE NEGATIVE Final   Influenza B by PCR NEGATIVE NEGATIVE Final    Comment: (NOTE) The Xpert Xpress SARS-CoV-2/FLU/RSV plus assay is intended as an aid in the diagnosis of influenza from Nasopharyngeal swab specimens and should not be used as a sole basis for treatment. Nasal washings and aspirates are unacceptable for Xpert Xpress SARS-CoV-2/FLU/RSV testing.  Fact Sheet for Patients: BloggerCourse.com  Fact Sheet for Healthcare Providers: SeriousBroker.it  This test is not yet approved or cleared by the [place] FDA and has been authorized for detection and/or diagnosis of SARS-CoV-2 by FDA under an Emergency Use Authorization (EUA). This EUA will remain in effect (meaning this test can be used) for the duration of the COVID-19 declaration under Section 564(b)(1) of the Act, 21 U.S.C. section 360bbb-3(b)(1), unless the authorization is terminated or revoked.     Resp Syncytial Virus by PCR NEGATIVE NEGATIVE Final    Comment: (NOTE) Fact Sheet for Patients: BloggerCourse.com  Fact Sheet for Healthcare Providers: SeriousBroker.it  This test is not yet approved or cleared by the [place] FDA and has been authorized for detection and/or diagnosis of SARS-CoV-2 by FDA under an Emergency Use Authorization (EUA). This EUA will remain in effect (meaning this test can be used)  for the duration of the COVID-19 declaration under Section 564(b)(1) of the Act, 21 U.S.C. section 360bbb-3(b)(1), unless the authorization is terminated or revoked.  Performed at [facility], [address], [place], [place] [address]   Expectorated Sputum Assessment w Gram Stain, Rflx to Resp Cult     Status: None   Collection Time: 11/23/22  9:18 PM   Specimen: Sputum  Result Value Ref Range Status   Specimen Description SPUTUM  Final   Special Requests NONE  Final   Sputum evaluation   Final    THIS SPECIMEN IS ACCEPTABLE FOR SPUTUM CULTURE Performed at [facility], [address] [place], [place] [address]    Report Status 11/25/2022 FINAL  Final  Culture, Respiratory w Gram Stain     Status: None   Collection Time: 11/23/22  9:18 PM   Specimen: SPU  Result Value Ref Range Status   Specimen Description SPUTUM  Final   Special Requests NONE Reflexed from [id number]  Final   Gram Stain   Final    RARE WBC PRESENT,BOTH PMN AND MONONUCLEAR RARE GRAM POSITIVE RODS    Culture   Final    Consistent with normal respiratory flora. No Pseudomonas species isolated Performed at [facility], [address], [place], [place] [address]    Report Status 11/27/2022 FINAL  Final  Respiratory (~20 pathogens) panel by PCR     Status: Abnormal   Collection Time: 11/24/22  2:37 AM   Specimen: Nasopharyngeal Swab; Respiratory  Result Value Ref Range Status   Adenovirus NOT DETECTED NOT DETECTED Final   Coronavirus 229E NOT DETECTED NOT DETECTED Final    Comment: (NOTE) The Coronavirus on the Respiratory Panel, DOES NOT test for the novel  Coronavirus (2019 nCoV)    Coronavirus HKU1 NOT DETECTED NOT DETECTED Final   Coronavirus NL63 NOT DETECTED NOT DETECTED Final   Coronavirus OC43 NOT DETECTED NOT DETECTED Final   Metapneumovirus NOT DETECTED NOT DETECTED Final   Rhinovirus / Enterovirus DETECTED (A) NOT DETECTED Final   Influenza A NOT DETECTED NOT DETECTED Final    Influenza B NOT DETECTED NOT DETECTED Final   Parainfluenza Virus 1 NOT DETECTED NOT DETECTED Final  Parainfluenza Virus 2 NOT DETECTED NOT DETECTED Final   Parainfluenza Virus 3 NOT DETECTED NOT DETECTED Final   Parainfluenza Virus 4 NOT DETECTED NOT DETECTED Final   Respiratory Syncytial Virus NOT DETECTED NOT DETECTED Final   Bordetella pertussis NOT DETECTED NOT DETECTED Final   Bordetella Parapertussis NOT DETECTED NOT DETECTED Final   Chlamydophila pneumoniae NOT DETECTED NOT DETECTED Final   Mycoplasma pneumoniae NOT DETECTED NOT DETECTED Final    Comment: Performed at [facility], [address], [place], [place] [address]         Radiology Studies: No results found.      Scheduled Meds:  aspirin EC  81 mg Oral Daily   buPROPion  300 mg Oral q morning   DULoxetine  60 mg Oral QHS   enoxaparin (LOVENOX) injection  50 mg Subcutaneous Q24H   ferrous sulfate  325 mg Oral QODAY   furosemide  60 mg Oral BID   gabapentin  300 mg Oral QHS   guaiFENesin  600 mg Oral BID   influenza vaccine adjuvanted  0.5 mL Intramuscular Tomorrow-1000   insulin aspart  0-20 Units Subcutaneous TID WC   insulin aspart  0-5 Units Subcutaneous QHS   insulin aspart  8 Units Subcutaneous TID WC   insulin glargine-yfgn  55 Units Subcutaneous BID   ipratropium-albuterol  3 mL Nebulization TID   magnesium oxide  400 mg Oral BID   pantoprazole  40 mg Oral q morning   potassium chloride  40 mEq Oral Q6H   predniSONE  40 mg Oral Q breakfast   rOPINIRole  4 mg Oral QPM   sodium chloride flush  3 mL Intravenous Q12H   Continuous Infusions:  potassium chloride       LOS: 5 days        Huey Bienenstock, MD [facility]   To contact the attending provider between 7A-7P or the covering provider during after hours 7P-7A, please log into the web site [email or web address] and access using universal [place] password for that web site. If you do not have the password, please call  the hospital operator.  11/28/2022, 11:13 AM

## 2022-11-28 NOTE — Progress Notes (Signed)
Mobility Specialist Progress Note:   11/28/22 1000  Oxygen Therapy  O2 Device Nasal Cannula  O2 Flow Rate (L/min) 2 L/min  Mobility  Activity Ambulated with assistance in hallway  Level of Assistance Contact guard assist, steadying assist  Assistive Device Front wheel walker  Distance Ambulated (ft) 50 ft  Activity Response Tolerated fair  Mobility Referral Yes  $Mobility charge 1 Mobility  Mobility Specialist Start Time (ACUTE ONLY) [phone]  Mobility Specialist Stop Time (ACUTE ONLY) 0947  Mobility Specialist Time Calculation (min) (ACUTE ONLY) 20 min    Pre Mobility: 78 HR,  96% SpO2 During Mobility: 102 HR, 92% SpO2 Post Mobility:  102 HR,  91% SpO2  Pt received in bed, agreeable to mobility. Took x3 standing rest breaks d/t SOB and fatigue. VSS throughout. C/o of feeling "lost" nearing end of session. Pt left in bed with call bell and all needs met.  Kathleen Hatfield Mobility Specialist Please contact via Special educational needs teacher or Rehab office at [phone]

## 2022-11-28 NOTE — Plan of Care (Signed)

## 2022-11-29 DIAGNOSIS — J441 Chronic obstructive pulmonary disease with (acute) exacerbation: Secondary | ICD-10-CM | POA: Diagnosis not present

## 2022-11-29 DIAGNOSIS — E162 Hypoglycemia, unspecified: Secondary | ICD-10-CM | POA: Diagnosis not present

## 2022-11-29 DIAGNOSIS — B348 Other viral infections of unspecified site: Secondary | ICD-10-CM | POA: Diagnosis not present

## 2022-11-29 LAB — BASIC METABOLIC PANEL
Anion gap: 16 — ABNORMAL HIGH (ref 5–15)
BUN: 23 mg/dL (ref 8–23)
CO2: 32 mmol/L (ref 22–32)
Calcium: 9.4 mg/dL (ref 8.9–10.3)
Chloride: 93 mmol/L — ABNORMAL LOW (ref 98–111)
Creatinine, Ser: 1 mg/dL (ref 0.44–1.00)
GFR, Estimated: >60 mL/min (ref >60)
Glucose, Bld: 110 mg/dL — ABNORMAL HIGH (ref 70–99)
Potassium: 3.9 mmol/L (ref 3.5–5.1)
Sodium: 141 mmol/L (ref 135–145)

## 2022-11-29 LAB — CBC
HCT: 42.4 % (ref 36.0–46.0)
Hemoglobin: 13.8 g/dL (ref 12.0–15.0)
MCH: 30.5 pg (ref 26.0–34.0)
MCHC: 32.5 g/dL (ref 30.0–36.0)
MCV: 93.8 fL (ref 80.0–100.0)
Platelets: 354 10*3/uL (ref 150–400)
RBC: 4.52 MIL/uL (ref 3.87–5.11)
RDW: 14.3 % (ref 11.5–15.5)
WBC: 15.1 10*3/uL — ABNORMAL HIGH (ref 4.0–10.5)
nRBC: 0 % (ref 0.0–0.2)

## 2022-11-29 LAB — GLUCOSE, CAPILLARY
Glucose-Capillary: 133 mg/dL — ABNORMAL HIGH (ref 70–99)
Glucose-Capillary: 37 mg/dL — CL (ref 70–99)
Glucose-Capillary: 169 mg/dL — ABNORMAL HIGH (ref 70–99)
Glucose-Capillary: 274 mg/dL — ABNORMAL HIGH (ref 70–99)
Glucose-Capillary: 278 mg/dL — ABNORMAL HIGH (ref 70–99)

## 2022-11-29 MED ORDER — ENOXAPARIN SODIUM 60 MG/0.6ML IJ SOSY
60.0000 mg | PREFILLED_SYRINGE | INTRAMUSCULAR | Status: DC
  Administered 2022-11-30: 60 mg via SUBCUTANEOUS
  Filled 2022-11-29: qty 0.6

## 2022-11-29 MED ORDER — INSULIN GLARGINE-YFGN 100 UNIT/ML ~~LOC~~ SOLN
30.0000 [IU] | Freq: Every day | SUBCUTANEOUS | Status: DC
  Filled 2022-11-29: qty 0.3

## 2022-11-29 MED ORDER — DEXTROSE 50 % IV SOLN
INTRAVENOUS | Status: AC
  Administered 2022-11-29: 50 mL
  Filled 2022-11-29: qty 50

## 2022-11-29 MED ORDER — INSULIN GLARGINE-YFGN 100 UNIT/ML ~~LOC~~ SOLN
30.0000 [IU] | Freq: Every day | SUBCUTANEOUS | Status: DC
  Administered 2022-11-29: 30 [IU] via SUBCUTANEOUS
  Filled 2022-11-29 (×2): qty 0.3

## 2022-11-29 MED ORDER — INSULIN ASPART 100 UNIT/ML IJ SOLN
5.0000 [IU] | Freq: Three times a day (TID) | INTRAMUSCULAR | Status: DC
  Administered 2022-11-29 – 2022-11-30 (×2): 5 [IU] via SUBCUTANEOUS

## 2022-11-29 NOTE — Progress Notes (Signed)
Pt. Refused cpap for tonight.

## 2022-11-29 NOTE — Progress Notes (Signed)
PROGRESS NOTE    Kathleen Hatfield  VQQ:[id number] DOB: [birth year] DOA: 11/23/2022 PCP: Joaquim Nam, MD   Chief Complaint  Patient presents with   Shortness of Breath   Chest Pain    Brief Narrative:   Kathleen Hatfield is a 67 y.o. female with past medical history of COPD, congestive heart failure on oxygen at baseline,  CKD stage III, morbid obesity, sleep apnea on CPAP, diabetes, ulcerative colitis, presented to the hospital with worsening dyspnea cough and productive sputum with difficulty lying flat in bed.  She was taking her inhalers without any relief so she decided come to the hospital.  Initial labs showed WBC elevated at 11.2.  Creatinine at 1.3.  COVID and influenza was negative.  Respiratory viral panel however showed rhinovirus.  Patient was then admitted to the hospital for further evaluation and treatment.   Assessment & Plan:   Active Problems:   Mixed diabetic hyperlipidemia associated with type 2 diabetes mellitus (HCC)   Essential hypertension   Hyperlipidemia   OSA on CPAP   (HFpEF) heart failure with preserved ejection fraction (HCC)   COPD (chronic obstructive pulmonary disease) (HCC)   RLS (restless legs syndrome)   Diabetes mellitus (HCC)   COPD exacerbation (HCC)   Chronic hypoxic respiratory failure (HCC)   Rhinovirus infection   COPD exacerbation Acute on chronic hypoxic respiratory failure Rhinovirus infection. Patient is on 2 L of oxygen at baseline but initially required BiPAP which was subsequently discontinued. - On 4 L nasal cannula, currently back to baseline on 2 L oxygen. -Currently off IV steroids, transition to p.o. prednisone as wheezing has improved. -Continue with IV antibiotics, given productive cough with dark phlegm, this can be discontinued at time of discharge. - she was encouraged with incentive spirometry, flutter valve, out of bed to chair, she remains symptomatic with significant dyspnea with minimal activity. - Respiratory viral  panel has resulted in rhinoviral infection at this time.     Demand ischemia. Secondary respiratory failure.  EKG with RBBB, no acute ischemic changes, high-sensitivity Trope downtrending 28-26.     Hypokalemia.   -Replaced   hyponatremia mild Mild.    Hypomagnesemia.  replcaed   Hypophosphatemia.  replaced   Diabetes type 2 with hyperglycemia:  -Tresiba and sliding scale at home.   -CBG remains significantly uncontrolled, her A1c at baseline 12.9 last month, is 11.2 since admission, BG remains significantly elevated, so her statin has been increased to 8 units twice daily, continue with sliding scale .she will need to be discharged on insulin twice daily regimen given large dose requirement (She is on Tresiba 110 units daily at home)  -Was seen by both nutritionist and diabetic coordinator -Patient with hypoglycemia 9/21 a.m. at 23, her long-acting insulin has been held for the morning morning, she received D50, will hold her morning Semglee, dose was lowered to 55 units twice daily,. -Her blood glucose is low again this morning at 37 despite only receiving one-time Semglee yesterday, she required D50, and orange juice, CBG controlled currently, will decrease her Semglee to 30 units daily for now and reassess tomorrow her needs.   HFpEF, without acute exacerbation Cardiac MRI in 2021 with preserved EF.  In' 21 with preserved EF.  Continue home Lasix 60 daily.  On as needed midodrine as needed at home.      ASCVD, hyperlipidemia: Continue aspirin 81 mg daily.  On PCSK9 as outpatient.   CKD 3: Creatinine baseline approximately 1.3.  With check levels in  AM.   Chronic pain: Continue  tramadol as needed.   Mood disorder: Continue home bupropion, duloxetine   Restless legs: Continue home ropinirole  DVT prophylaxis: lovenox Code Status: Full Family Communication: none at bedside  Disposition:   Status is: Inpatient    Consultants:  none   Subjective:  Patient with another  episode of hypoglycemia in the morning at 37 despite receiving time 55 units of Semglee (home dose 110, and here she was up to 80 units twice daily couple days ago).  Objective: Vitals:   11/29/22 0410 11/29/22 0420 11/29/22 0832 11/29/22 1217  BP:  (!) 141/70 138/81 (!) 140/79  Pulse: 74 70 62 84  Resp: 15 15 17 16   Temp:  97.7 F (36.5 C) 98 F (36.7 C) 98.2 F (36.8 C)  TempSrc:  Oral Oral Oral  SpO2: 95% 94% 96% 93%  Weight:      Height:        Intake/Output Summary (Last 24 hours) at 11/29/2022 1246 Last data filed at 11/28/2022 1445 Gross per 24 hour  Intake 120 ml  Output 0 ml  Net 120 ml   Filed Weights   11/23/22 1931  Weight: 115 kg    Examination:  Awake Alert, Oriented X 3, No new F.N deficits, Normal affect Symmetrical Chest wall movement, scattered Rales. RRR,No Gallops,Rubs or new Murmurs, No Parasternal Heave +ve B.Sounds, Abd Soft, No tenderness, No rebound - guarding or rigidity. No Cyanosis, Clubbing or edema, No new Rash or bruise        Data Reviewed: I have personally reviewed following labs and imaging studies  CBC: Recent Labs  Lab 11/23/22 1200 11/23/22 1600 11/25/22 0400 11/26/22 0638 11/27/22 0749 11/28/22 0809 11/29/22 0941  WBC 12.1*   < > 16.9* 14.9* 13.0* 19.2* 15.1*  NEUTROABS 7.6  --   --   --   --   --   --   HGB 13.2   < > 13.4 13.5 12.9 13.6 13.8  HCT 41.1   < > 40.3 40.3 39.2 42.2 42.4  MCV 94.2   < > 92.9 92.9 93.8 94.6 93.8  PLT 322.0   < > 374 385 365 373 354   < > = values in this interval not displayed.    Basic Metabolic Panel: Recent Labs  Lab 11/23/22 2010 11/24/22 0340 11/25/22 0400 11/26/22 0638 11/27/22 0749 11/28/22 0809 11/29/22 0941  NA  --  134* 134* 135 139 138 141  K  --  2.9* 4.1 3.9 4.5 2.8* 3.9  CL  --  92* 93* 98 97* 96* 93*  CO2  --  23 28 28 31 30  32  GLUCOSE  --  491* 302* 232* 221* 134* 110*  BUN  --  18 26* 31* 32* 28* 23  CREATININE  --  1.47* 1.23* 1.11* 1.18* 1.11* 1.00   CALCIUM  --  8.9 9.1 8.8* 9.1 8.9 9.4  MG 1.8 1.6* 2.0 2.2  --   --   --   PHOS  --  1.9*  --  3.7 3.2 3.9  --     GFR: Estimated Creatinine Clearance: 69.1 mL/min (by C-G formula based on SCr of 1 mg/dL).  Liver Function Tests: No results for input(s): "AST", "ALT", "ALKPHOS", "BILITOT", "PROT", "ALBUMIN" in the last 168 hours.  CBG: Recent Labs  Lab 11/28/22 1702 11/28/22 2145 11/29/22 0830 11/29/22 0850 11/29/22 1216  GLUCAP 259* 174* 37* 133* 169*     Recent Results (from the past  240 hour(s))  Resp panel by RT-PCR (RSV, Flu A&B, Covid) Anterior Nasal Swab     Status: None   Collection Time: 11/23/22  7:27 PM   Specimen: Anterior Nasal Swab  Result Value Ref Range Status   SARS Coronavirus 2 by RT PCR NEGATIVE NEGATIVE Final   Influenza A by PCR NEGATIVE NEGATIVE Final   Influenza B by PCR NEGATIVE NEGATIVE Final    Comment: (NOTE) The Xpert Xpress SARS-CoV-2/FLU/RSV plus assay is intended as an aid in the diagnosis of influenza from Nasopharyngeal swab specimens and should not be used as a sole basis for treatment. Nasal washings and aspirates are unacceptable for Xpert Xpress SARS-CoV-2/FLU/RSV testing.  Fact Sheet for Patients: BloggerCourse.com  Fact Sheet for Healthcare Providers: SeriousBroker.it  This test is not yet approved or cleared by the [place] FDA and has been authorized for detection and/or diagnosis of SARS-CoV-2 by FDA under an Emergency Use Authorization (EUA). This EUA will remain in effect (meaning this test can be used) for the duration of the COVID-19 declaration under Section 564(b)(1) of the Act, 21 U.S.C. section 360bbb-3(b)(1), unless the authorization is terminated or revoked.     Resp Syncytial Virus by PCR NEGATIVE NEGATIVE Final    Comment: (NOTE) Fact Sheet for Patients: BloggerCourse.com  Fact Sheet for Healthcare  Providers: SeriousBroker.it  This test is not yet approved or cleared by the [place] FDA and has been authorized for detection and/or diagnosis of SARS-CoV-2 by FDA under an Emergency Use Authorization (EUA). This EUA will remain in effect (meaning this test can be used) for the duration of the COVID-19 declaration under Section 564(b)(1) of the Act, 21 U.S.C. section 360bbb-3(b)(1), unless the authorization is terminated or revoked.  Performed at [facility], [address], [place], [place] [address]   Expectorated Sputum Assessment w Gram Stain, Rflx to Resp Cult     Status: None   Collection Time: 11/23/22  9:18 PM   Specimen: Sputum  Result Value Ref Range Status   Specimen Description SPUTUM  Final   Special Requests NONE  Final   Sputum evaluation   Final    THIS SPECIMEN IS ACCEPTABLE FOR SPUTUM CULTURE Performed at [facility], [address] [place], [place] [address]    Report Status 11/25/2022 FINAL  Final  Culture, Respiratory w Gram Stain     Status: None   Collection Time: 11/23/22  9:18 PM   Specimen: SPU  Result Value Ref Range Status   Specimen Description SPUTUM  Final   Special Requests NONE Reflexed from [id number]  Final   Gram Stain   Final    RARE WBC PRESENT,BOTH PMN AND MONONUCLEAR RARE GRAM POSITIVE RODS    Culture   Final    Consistent with normal respiratory flora. No Pseudomonas species isolated Performed at [facility], [address] [place], [place] [address]    Report Status 11/27/2022 FINAL  Final  Respiratory (~20 pathogens) panel by PCR     Status: Abnormal   Collection Time: 11/24/22  2:37 AM   Specimen: Nasopharyngeal Swab; Respiratory  Result Value Ref Range Status   Adenovirus NOT DETECTED NOT DETECTED Final   Coronavirus 229E NOT DETECTED NOT DETECTED Final    Comment: (NOTE) The Coronavirus on the Respiratory Panel, DOES NOT test for the novel  Coronavirus (2019 nCoV)     Coronavirus HKU1 NOT DETECTED NOT DETECTED Final   Coronavirus NL63 NOT DETECTED NOT DETECTED Final   Coronavirus  OC43 NOT DETECTED NOT DETECTED Final   Metapneumovirus NOT DETECTED NOT DETECTED Final   Rhinovirus / Enterovirus DETECTED (A) NOT DETECTED Final   Influenza A NOT DETECTED NOT DETECTED Final   Influenza B NOT DETECTED NOT DETECTED Final   Parainfluenza Virus 1 NOT DETECTED NOT DETECTED Final   Parainfluenza Virus 2 NOT DETECTED NOT DETECTED Final   Parainfluenza Virus 3 NOT DETECTED NOT DETECTED Final   Parainfluenza Virus 4 NOT DETECTED NOT DETECTED Final   Respiratory Syncytial Virus NOT DETECTED NOT DETECTED Final   Bordetella pertussis NOT DETECTED NOT DETECTED Final   Bordetella Parapertussis NOT DETECTED NOT DETECTED Final   Chlamydophila pneumoniae NOT DETECTED NOT DETECTED Final   Mycoplasma pneumoniae NOT DETECTED NOT DETECTED Final    Comment: Performed at [facility], [address], [place], [place] [address]         Radiology Studies: No results found.      Scheduled Meds:  aspirin EC  81 mg Oral Daily   buPROPion  300 mg Oral q morning   DULoxetine  60 mg Oral QHS   enoxaparin (LOVENOX) injection  50 mg Subcutaneous Q24H   ferrous sulfate  325 mg Oral QODAY   furosemide  60 mg Oral BID   gabapentin  300 mg Oral QHS   guaiFENesin  600 mg Oral BID   influenza vaccine adjuvanted  0.5 mL Intramuscular Tomorrow-1000   insulin aspart  0-20 Units Subcutaneous TID WC   insulin aspart  0-5 Units Subcutaneous QHS   insulin aspart  5 Units Subcutaneous TID WC   insulin glargine-yfgn  30 Units Subcutaneous Daily   ipratropium-albuterol  3 mL Nebulization TID   magnesium oxide  400 mg Oral BID   pantoprazole  40 mg Oral q morning   predniSONE  40 mg Oral Q breakfast   rOPINIRole  4 mg Oral QPM   sodium chloride flush  3 mL Intravenous Q12H   Continuous Infusions:     LOS: 6 days        Huey Bienenstock, MD [facility]   To  contact the attending provider between 7A-7P or the covering provider during after hours 7P-7A, please log into the web site [email or web address] and access using universal [place] password for that web site. If you do not have the password, please call the hospital operator.  11/29/2022, 12:46 PM

## 2022-11-30 ENCOUNTER — Other Ambulatory Visit (HOSPITAL_COMMUNITY): Payer: Self-pay

## 2022-11-30 DIAGNOSIS — J441 Chronic obstructive pulmonary disease with (acute) exacerbation: Secondary | ICD-10-CM | POA: Diagnosis not present

## 2022-11-30 LAB — CBC
HCT: 42.6 % (ref 36.0–46.0)
Hemoglobin: 13.8 g/dL (ref 12.0–15.0)
MCH: 31 pg (ref 26.0–34.0)
MCHC: 32.4 g/dL (ref 30.0–36.0)
MCV: 95.7 fL (ref 80.0–100.0)
Platelets: 365 K/uL (ref 150–400)
RBC: 4.45 MIL/uL (ref 3.87–5.11)
RDW: 14.4 % (ref 11.5–15.5)
WBC: 15.9 K/uL — ABNORMAL HIGH (ref 4.0–10.5)
nRBC: 0 % (ref 0.0–0.2)

## 2022-11-30 LAB — BASIC METABOLIC PANEL WITH GFR
Anion gap: 13 (ref 5–15)
BUN: 22 mg/dL (ref 8–23)
CO2: 33 mmol/L — ABNORMAL HIGH (ref 22–32)
Calcium: 9.1 mg/dL (ref 8.9–10.3)
Chloride: 93 mmol/L — ABNORMAL LOW (ref 98–111)
Creatinine, Ser: 1.09 mg/dL — ABNORMAL HIGH (ref 0.44–1.00)
GFR, Estimated: 56 mL/min — ABNORMAL LOW
Glucose, Bld: 141 mg/dL — ABNORMAL HIGH (ref 70–99)
Potassium: 3.5 mmol/L (ref 3.5–5.1)
Sodium: 139 mmol/L (ref 135–145)

## 2022-11-30 LAB — GLUCOSE, CAPILLARY: Glucose-Capillary: 148 mg/dL — ABNORMAL HIGH (ref 70–99)

## 2022-11-30 MED ORDER — PREDNISONE 10 MG (21) PO TBPK
ORAL_TABLET | ORAL | 0 refills | Status: DC
  Filled 2022-11-30: qty 21, 6d supply, fill #0

## 2022-11-30 MED ORDER — TRESIBA FLEXTOUCH 200 UNIT/ML ~~LOC~~ SOPN: 50.0000 [IU] | PEN_INJECTOR | Freq: Every day | SUBCUTANEOUS | Status: DC

## 2022-11-30 NOTE — Progress Notes (Signed)
Nurse requested Mobility Specialist to perform oxygen saturation test with pt which includes removing pt from oxygen both at rest and while ambulating.  Below are the results from that testing.     Patient Saturations on Room Air at Rest = spO2 86%  Patient Saturations on Room Air while Ambulating = sp02 NA% .    Patient Saturations on 2 Liters of oxygen while Ambulating = sp02 89%  At end of testing pt left in room on 2  Liters of oxygen.  Reported results to nurse.    Caesar Bookman Mobility Specialist Please contact via SecureChat or Rehab Office [phone]

## 2022-11-30 NOTE — Progress Notes (Signed)
Mobility Specialist Progress Note;   11/30/22 1100  Mobility  Activity Ambulated with assistance in hallway  Level of Assistance Standby assist, set-up cues, supervision of patient - no hands on  Assistive Device Front wheel walker  Distance Ambulated (ft) 50 ft (x1 short rest brk)  Activity Response Tolerated fair  Mobility Referral Yes  $Mobility charge 1 Mobility  Mobility Specialist Start Time (ACUTE ONLY) 1100  Mobility Specialist Stop Time (ACUTE ONLY) 1125  Mobility Specialist Time Calculation (min) (ACUTE ONLY) 25 min   During-mobility: 89-91% on 2LO2  Pt was agreeable to mobility. Required no physical assistance, SV. Pt displayed SOB post ambulation. SPO2 WFL on 2LO2. Pt was left in chair with all needs met.   Kathleen Hatfield Mobility Specialist Please contact via SecureChat or Rehab Office [phone]

## 2022-11-30 NOTE — Discharge Instructions (Signed)
Follow with Primary MD Joaquim Nam, MD in 7 days   Get CBC, CMP, 2 view Chest X ray checked  by Primary MD next visit.    Activity: As tolerated with Full fall precautions use walker/cane & assistance as needed   Disposition Home    Diet: Heart Healthy /carb modified   On your next visit with your primary care physician please Get Medicines reviewed and adjusted.   Please request your Prim.MD to go over all Hospital Tests and Procedure/Radiological results at the follow up, please get all Hospital records sent to your Prim MD by signing hospital release before you go home.   If you experience worsening of your admission symptoms, develop shortness of breath, life threatening emergency, suicidal or homicidal thoughts you must seek medical attention immediately by calling 911 or calling your MD immediately  if symptoms less severe.  You Must read complete instructions/literature along with all the possible adverse reactions/side effects for all the Medicines you take and that have been prescribed to you. Take any new Medicines after you have completely understood and accpet all the possible adverse reactions/side effects.   Do not drive, operating heavy machinery, perform activities at heights, swimming or participation in water activities or provide baby sitting services if your were admitted for syncope or siezures until you have seen by Primary MD or a Neurologist and advised to do so again.  Do not drive when taking Pain medications.    Do not take more than prescribed Pain, Sleep and Anxiety Medications  Special Instructions: If you have smoked or chewed Tobacco  in the last 2 yrs please stop smoking, stop any regular Alcohol  and or any Recreational drug use.  Wear Seat belts while driving.   Please note  You were cared for by a hospitalist during your hospital stay. If you have any questions about your discharge medications or the care you received while you were in  the hospital after you are discharged, you can call the unit and asked to speak with the hospitalist on call if the hospitalist that took care of you is not available. Once you are discharged, your primary care physician will handle any further medical issues. Please note that NO REFILLS for any discharge medications will be authorized once you are discharged, as it is imperative that you return to your primary care physician (or establish a relationship with a primary care physician if you do not have one) for your aftercare needs so that they can reassess your need for medications and monitor your lab values.

## 2022-11-30 NOTE — TOC Transition Note (Addendum)
Transition of Care [facility]) - CM/SW Discharge Note   Patient Details  Name: Kathleen Hatfield MRN: [id number] Date of Birth: 1955-09-10  [facility] of [facility]) CM/SW Contact:  Gordy Clement, RN Phone Number: 11/30/2022, 10:13 AM   Clinical Narrative:    Patient will DC to home today with Home Health PT and OT  [facility] will provide services ( [facility]) .  No DME has been recommended.  Family to transport  S/O to bring portable O2 from home for patient to transport with   No additional TOC needs            Patient Goals and CMS Choice      Discharge Placement                         Discharge Plan and Services Additional resources added to the After Visit Summary for                                       Social Determinants of Health (SDOH) Interventions SDOH Screenings   Food Insecurity: No Food Insecurity (11/24/2022)  Housing: Medium Risk (11/24/2022)  Transportation Needs: No Transportation Needs (11/24/2022)  Utilities: Not At Risk (11/24/2022)  Alcohol Screen: Low Risk  (07/14/2022)  Depression (PHQ2-9): High Risk (09/21/2022)  Financial Resource Strain: Low Risk  (07/14/2022)  Physical Activity: Insufficiently Active (07/14/2022)  Social Connections: Socially Isolated (07/14/2022)  Stress: No Stress Concern Present (07/14/2022)  Tobacco Use: Low Risk  (11/23/2022)     Readmission Risk Interventions     No data to display

## 2022-11-30 NOTE — Discharge Summary (Signed)
Physician Discharge Summary  [name] ZDG:[id number] DOB: 13-Nov-1955 DOA: 11/23/2022  PCP: Joaquim Nam, MD  Admit date: 11/23/2022 Discharge date: 11/30/2022  Admitted From: (Home) Disposition:  (Home )  Recommendations for Outpatient Follow-up:  Follow up with PCP in 1-2 weeks Please obtain BMP/CBC in one week Encouraged use incentive spirometry and flutter valve Please adjust her insulin dose as needed, please see discussion below  Home Health: (YES)  Diet recommendation: Heart Healthy / Carb Modified   Brief/Interim Summary:   Kathleen Hatfield is a 67 y.o. female with past medical history of COPD, congestive heart failure on oxygen at baseline,  CKD stage III, morbid obesity, sleep apnea on CPAP, diabetes, ulcerative colitis, presented to the hospital with worsening dyspnea cough and productive sputum with difficulty lying flat in bed.  She was taking her inhalers without any relief so she decided come to the hospital.  Initial labs showed WBC elevated at 11.2.  Creatinine at 1.3.  COVID and influenza was negative.  Respiratory viral panel however showed rhinovirus.  Patient was then admitted to the hospital for further evaluation and treatment.     COPD exacerbation Acute on chronic hypoxic respiratory failure Rhinovirus infection. Patient is on 2 L of oxygen at baseline but initially required BiPAP which was subsequently discontinued.  Requiring 4 L post BiPAP, but currently back to her baseline on 2 L nasal cannula, she was treated with IV steroids, but dyspnea, wheezing improved, which she is to be discharged on short prednisone taper, she was treated with IV antibiotics during hospital stay given productive cough, with dark phlegm, this has much improved at time of discharge, no further antibiotics on discharge, she was instructed to take her incentive spirometry and flutter valve and to keep using them at home. - Respiratory viral panel has resulted in rhinoviral infection at  this time.  Her PD exacerbation was provoked by her rhinovirus infection.   Demand ischemia. Secondary respiratory failure.  EKG with RBBB, no acute ischemic changes, high-sensitivity Trope downtrending 28-26.     Hypokalemia.   -Replaced    hyponatremia mild Mild.    Hypomagnesemia.  replcaed   Hypophosphatemia.  replaced   Diabetes type 2 poorly controlled with hyperglycemia and hypoglycemia - A1c at baseline 12.9 last month, is 11.2 this admission. -He is on Tresiba 110 mg oral daily, her CBG significantly uncontrolled initially due to steroids, and acute illness, requiring uptitrating her insulin regimen significantly, and to split her dose to twice daily, she was up to 75 units twice daily, but she did develop significant hypoglycemia once her steroids were tapered, for which her insulin has been decreased significantly, and despite only receiving 30 units of Semglee today before y she was hypoglycemic yesterday at 48, so she was instructed to continue with Tresiba 50 units daily and uptitrate dose as needed, and to continue with her NovoLog sliding scale.  HFpEF, without acute exacerbation Cardiac MRI in 2021 with preserved EF.  In' 21 with preserved EF.  Continue home Lasix 60 daily.  On as needed midodrine as needed at home.      ASCVD, hyperlipidemia: Continue aspirin 81 mg daily.  On PCSK9 as outpatient.   CKD 3: Creatinine baseline approximately 1.3.  With check levels in AM.   Chronic pain: Continue  tramadol as needed.   Mood disorder: Continue home bupropion, duloxetine   Restless legs: Continue home ropinirole   Discharge Diagnoses:  Active Problems:   Mixed diabetic hyperlipidemia associated with type  2 diabetes mellitus (HCC)   Essential hypertension   Hyperlipidemia   OSA on CPAP   (HFpEF) heart failure with preserved ejection fraction (HCC)   COPD (chronic obstructive pulmonary disease) (HCC)   RLS (restless legs syndrome)   Diabetes mellitus (HCC)   COPD  exacerbation (HCC)   Chronic hypoxic respiratory failure (HCC)   Rhinovirus infection    Discharge Instructions  Discharge Instructions     Diet - low sodium heart healthy   Complete by: As directed    Discharge instructions   Complete by: As directed    Follow with Primary MD Joaquim Nam, MD in 7 days   Get CBC, CMP, 2 view Chest X ray checked  by Primary MD next visit.    Activity: As tolerated with Full fall precautions use walker/cane & assistance as needed   Disposition Home    Diet: Heart Healthy /carb modified   On your next visit with your primary care physician please Get Medicines reviewed and adjusted.   Please request your Prim.MD to go over all Hospital Tests and Procedure/Radiological results at the follow up, please get all Hospital records sent to your Prim MD by signing hospital release before you go home.   If you experience worsening of your admission symptoms, develop shortness of breath, life threatening emergency, suicidal or homicidal thoughts you must seek medical attention immediately by calling 911 or calling your MD immediately  if symptoms less severe.  You Must read complete instructions/literature along with all the possible adverse reactions/side effects for all the Medicines you take and that have been prescribed to you. Take any new Medicines after you have completely understood and accpet all the possible adverse reactions/side effects.   Do not drive, operating heavy machinery, perform activities at heights, swimming or participation in water activities or provide baby sitting services if your were admitted for syncope or siezures until you have seen by Primary MD or a Neurologist and advised to do so again.  Do not drive when taking Pain medications.    Do not take more than prescribed Pain, Sleep and Anxiety Medications  Special Instructions: If you have smoked or chewed Tobacco  in the last 2 yrs please stop smoking, stop any  regular Alcohol  and or any Recreational drug use.  Wear Seat belts while driving.   Please note  You were cared for by a hospitalist during your hospital stay. If you have any questions about your discharge medications or the care you received while you were in the hospital after you are discharged, you can call the unit and asked to speak with the hospitalist on call if the hospitalist that took care of you is not available. Once you are discharged, your primary care physician will handle any further medical issues. Please note that NO REFILLS for any discharge medications will be authorized once you are discharged, as it is imperative that you return to your primary care physician (or establish a relationship with a primary care physician if you do not have one) for your aftercare needs so that they can reassess your need for medications and monitor your lab values.   Increase activity slowly   Complete by: As directed       Allergies as of 11/30/2022       Reactions   Almond Oil Anaphylaxis, Shortness Of Breath, Swelling   Morphine And Codeine Shortness Of Breath, Other (See Comments)   Pt. States while in the hospital it affected her breathing,  O2 dropped to the 70's   Primidone Shortness Of Breath       Atorvastatin Other (See Comments)   Leg weakness   Ceclor [cefaclor] Nausea And Vomiting   Sulfa Antibiotics Nausea And Vomiting   Ciprofloxacin Other (See Comments)   Makes joints and muscles ache   Levaquin [levofloxacin] Other (See Comments)   Body aches   Losartan Other (See Comments)   Weakness   Peanut-containing Drug Products    Pt states she thinks it is causing gout flare-ups   Repatha [evolocumab]    Aches   Statins Other (See Comments)   Leg and body weakness        Medication List     TAKE these medications    acetaminophen 500 MG tablet Commonly known as: TYLENOL Take 500 mg by mouth every 6 (six) hours as needed for moderate pain.   albuterol 108  (90 Base) MCG/ACT inhaler Commonly known as: VENTOLIN HFA INHALE TWO PUFFS BY MOUTH INTO LUNGS every SIX hours AS NEEDED What changed: See the new instructions.   aspirin EC 81 MG tablet Take 1 tablet (81 mg total) by mouth daily. Swallow whole.   Bayer Contour Next Test test strip Generic drug: glucose blood 1 each by Other route daily. As directed   buPROPion 300 MG 24 hr tablet Commonly known as: WELLBUTRIN XL Take 1 tablet (300 mg total) by mouth every morning.   colchicine 0.6 MG tablet TAKE 1 TABLET (0.6 MG TOTAL) BY MOUTH DAILY AS NEEDED (FOR GOUT)   Dexcom G7 Receiver Devi by Does not apply route.   Dexcom G7 Sensor Misc 1 Device by Does not apply route as directed. Apply sensor every 10 days   dicyclomine 20 MG tablet Commonly known as: BENTYL TAKE 1 TABLET UP TO FOUR TIMES A DAY FOR GI CRAMPING, PAIN, NAUSEA, AND VOMITING AS NEEDED   DULoxetine 60 MG capsule Commonly known as: CYMBALTA TAKE ONE CAPSULE BY MOUTH EVERYDAY AT BEDTIME   estradiol 0.1 MG/GM vaginal cream Commonly known as: ESTRACE Place 1 Applicatorful vaginally 3 (three) times a week.   FeroSul 325 (65 Fe) MG tablet Generic drug: ferrous sulfate TAKE ONE TABLET BY MOUTH every other evening   fluticasone 50 MCG/ACT nasal spray Commonly known as: FLONASE Place 2 sprays into both nostrils daily. What changed:  when to take this reasons to take this   furosemide 20 MG tablet Commonly known as: LASIX Take 3 tablets (60 mg total) by mouth 2 (two) times daily.   gabapentin 300 MG capsule Commonly known as: NEURONTIN TAKE ONE CAPSULE BY MOUTH EVERYDAY AT BEDTIME May take additional capsule daily if needed   Global Ease Inject Pen Needles 32G X 4 MM Misc Generic drug: Insulin Pen Needle 1 Device by Other route in the morning, at noon, in the evening, and at bedtime.   ipratropium-albuterol 0.5-2.5 (3) MG/3ML Soln Commonly known as: DUONEB Take 3 mLs by nebulization every 6 (six) hours as  needed (wheezing).   ketoconazole 2 % cream Commonly known as: NIZORAL APPLY TO AFFECTED AREA TWICE A DAY What changed: See the new instructions.   meloxicam 15 MG tablet Commonly known as: MOBIC TAKE 1 TABLET (15 MG TOTAL) BY MOUTH DAILY. What changed:  when to take this reasons to take this   mesalamine 1.2 g EC tablet Commonly known as: LIALDA Take 2.4 g by mouth in the morning and at bedtime.   metolazone 5 MG tablet Commonly known as: ZAROXOLYN TAKE 1 TABLET  BY MOUTH EVERY DAY AS NEEDED   Microlet Lancets Misc 1 each by Other route. As directed   nitrofurantoin 50 MG capsule Commonly known as: MACRODANTIN Take 50 mg by mouth at bedtime.   NovoLOG FlexPen 100 UNIT/ML FlexPen Generic drug: insulin aspart Max daily 45 units What changed:  how much to take how to take this when to take this additional instructions   nystatin powder Commonly known as: MYCOSTATIN/NYSTOP Apply 1 application topically 3 (three) times daily. What changed:  when to take this reasons to take this   ondansetron 4 MG tablet Commonly known as: ZOFRAN TAKE ONE TABLET BY MOUTH every EIGHT hours AS NEEDED FOR NAUSEA AND VOMITING   OXYGEN Inhale 2 L into the lungs continuous.   pantoprazole 40 MG tablet Commonly known as: PROTONIX TAKE ONE TABLET BY MOUTH EVERY MORNING   polyethylene glycol 17 g packet Commonly known as: MIRALAX / GLYCOLAX Take 17 g by mouth daily as needed for mild constipation.   potassium chloride 10 MEQ CR capsule Commonly known as: MICRO-K TAKE 5 CAPSULES BY MOUTH AT NOON What changed: See the new instructions.   Praluent 150 MG/ML Soaj Generic drug: Alirocumab Inject 1 mL (150 mg total) into the skin every 14 (fourteen) days.   predniSONE 10 MG (21) Tbpk tablet Commonly known as: STERAPRED UNI-PAK 21 TAB Please use per package instruction   rOPINIRole 4 MG tablet Commonly known as: REQUIP TAKE 1 TABLET BY MOUTH EVERYDAY AT BEDTIME What changed:   how much to take how to take this when to take this additional instructions   Semaglutide (2 MG/DOSE) 8 MG/3ML Sopn Inject 2 mg as directed once a week. What changed: additional instructions   simethicone 80 MG chewable tablet Commonly known as: Gas-X Chew 1 tablet (80 mg total) by mouth every 6 (six) hours as needed for flatulence.   spironolactone 25 MG tablet Commonly known as: ALDACTONE Take 0.5 tablets (12.5 mg total) by mouth daily.   traMADol 50 MG tablet Commonly known as: ULTRAM Take 1 tablet (50 mg total) by mouth 3 (three) times daily as needed.   Evaristo Bury FlexTouch 200 UNIT/ML FlexTouch Pen Generic drug: insulin degludec Inject 50 Units into the skin daily. What changed: how much to take   UNABLE TO FIND CPAP- At bedtime   Vitamin D-3 125 MCG (5000 UT) Tabs Take 5,000 Units by mouth every other day.        Follow-up Information     [facility] Office Follow up.   Specialty: Home Health Services Why: Frances Furbish will contact you 48 hours after discharge to home to arrange home health visit Contact information: [address] [facility] [place] [address] [phone]               Allergies  Allergen Reactions   Almond Oil Anaphylaxis, Shortness Of Breath and Swelling   Morphine And Codeine Shortness Of Breath and Other (See Comments)    Pt. States while in the hospital it affected her breathing, O2 dropped to the 70's   Primidone Shortness Of Breath        Atorvastatin Other (See Comments)    Leg weakness   Ceclor [Cefaclor] Nausea And Vomiting   Sulfa Antibiotics Nausea And Vomiting   Ciprofloxacin Other (See Comments)    Makes joints and muscles ache   Levaquin [Levofloxacin] Other (See Comments)    Body aches   Losartan Other (See Comments)    Weakness  Peanut-Containing Drug Products     Pt states she thinks it is causing gout flare-ups   Repatha [Evolocumab]     Aches   Statins Other (See  Comments)    Leg and body weakness    Consultations: None  Procedures/Studies: DG Chest 2 View  Result Date: 11/23/2022 CLINICAL DATA:  Short of breath and chest pain EXAM: CHEST - 2 VIEW COMPARISON:  11/23/2022 FINDINGS: Frontal and lateral views of the chest demonstrate an unremarkable cardiac silhouette. No acute airspace disease, effusion, or pneumothorax. No acute bony abnormalities. IMPRESSION: 1. No acute intrathoracic process. Electronically Signed   By: Sharlet Salina M.D.   On: 11/23/2022 17:19   DG Chest 2 View  Result Date: 11/23/2022 CLINICAL DATA:  Shortness of breath EXAM: CHEST - 2 VIEW COMPARISON:  09/21/2022 FINDINGS: The heart size and mediastinal contours are stable. Coarsened interstitial markings bilaterally. No new airspace consolidation. No pleural effusion or pneumothorax. The visualized skeletal structures are unremarkable. IMPRESSION: Coarsened interstitial markings bilaterally, which may reflect chronic interstitial lung disease. No new airspace consolidation. Electronically Signed   By: Duanne Guess D.O.   On: 11/23/2022 13:32      Subjective: Congestion, dyspnea at baseline, no hypoglycemia today  Discharge Exam: Vitals:   11/30/22 0415 11/30/22 0740  BP: (!) 131/55   Pulse: 85 83  Resp: 18 18  Temp: 98.2 F (36.8 C) 98.2 F (36.8 C)  SpO2:  92%   Vitals:   11/29/22 2150 11/30/22 0033 11/30/22 0415 11/30/22 0740  BP: (!) 144/81 (!) 174/89 (!) 131/55   Pulse:  90 85 83  Resp: 16  18 18   Temp: 98.2 F (36.8 C) 98 F (36.7 C) 98.2 F (36.8 C) 98.2 F (36.8 C)  TempSrc: Oral Oral Oral Oral  SpO2: 98%   92%  Weight:      Height:        General: Pt is alert, awake, not in acute distress Cardiovascular: RRR, S1/S2 +, no rubs, no gallops Respiratory: Wheezing much improved, no respiratory distress, no tachypnea, no use of accessory muscles Abdominal: Soft, NT, ND, bowel sounds + Extremities: no edema, no cyanosis    The results of  significant diagnostics from this hospitalization (including imaging, microbiology, ancillary and laboratory) are listed below for reference.     Microbiology: Recent Results (from the past 240 hour(s))  Resp panel by RT-PCR (RSV, Flu A&B, Covid) Anterior Nasal Swab     Status: None   Collection Time: 11/23/22  7:27 PM   Specimen: Anterior Nasal Swab  Result Value Ref Range Status   SARS Coronavirus 2 by RT PCR NEGATIVE NEGATIVE Final   Influenza A by PCR NEGATIVE NEGATIVE Final   Influenza B by PCR NEGATIVE NEGATIVE Final    Comment: (NOTE) The Xpert Xpress SARS-CoV-2/FLU/RSV plus assay is intended as an aid in the diagnosis of influenza from Nasopharyngeal swab specimens and should not be used as a sole basis for treatment. Nasal washings and aspirates are unacceptable for Xpert Xpress SARS-CoV-2/FLU/RSV testing.  Fact Sheet for Patients: BloggerCourse.com  Fact Sheet for Healthcare Providers: SeriousBroker.it  This test is not yet approved or cleared by the [place] FDA and has been authorized for detection and/or diagnosis of SARS-CoV-2 by FDA under an Emergency Use Authorization (EUA). This EUA will remain in effect (meaning this test can be used) for the duration of the COVID-19 declaration under Section 564(b)(1) of the Act, 21 U.S.C. section 360bbb-3(b)(1), unless the authorization is terminated or revoked.  Resp Syncytial Virus by PCR NEGATIVE NEGATIVE Final    Comment: (NOTE) Fact Sheet for Patients: BloggerCourse.com  Fact Sheet for Healthcare Providers: SeriousBroker.it  This test is not yet approved or cleared by the [place] FDA and has been authorized for detection and/or diagnosis of SARS-CoV-2 by FDA under an Emergency Use Authorization (EUA). This EUA will remain in effect (meaning this test can be used) for the duration of the COVID-19  declaration under Section 564(b)(1) of the Act, 21 U.S.C. section 360bbb-3(b)(1), unless the authorization is terminated or revoked.  Performed at [facility], [address] [place], [place] [address]   Expectorated Sputum Assessment w Gram Stain, Rflx to Resp Cult     Status: None   Collection Time: 11/23/22  9:18 PM   Specimen: Sputum  Result Value Ref Range Status   Specimen Description SPUTUM  Final   Special Requests NONE  Final   Sputum evaluation   Final    THIS SPECIMEN IS ACCEPTABLE FOR SPUTUM CULTURE Performed at [facility], [address] [place], [place] [address]    Report Status 11/25/2022 FINAL  Final  Culture, Respiratory w Gram Stain     Status: None   Collection Time: 11/23/22  9:18 PM   Specimen: SPU  Result Value Ref Range Status   Specimen Description SPUTUM  Final   Special Requests NONE Reflexed from [id number]  Final   Gram Stain   Final    RARE WBC PRESENT,BOTH PMN AND MONONUCLEAR RARE GRAM POSITIVE RODS    Culture   Final    Consistent with normal respiratory flora. No Pseudomonas species isolated Performed at [facility], [address] [place], [place] [address]    Report Status 11/27/2022 FINAL  Final  Respiratory (~20 pathogens) panel by PCR     Status: Abnormal   Collection Time: 11/24/22  2:37 AM   Specimen: Nasopharyngeal Swab; Respiratory  Result Value Ref Range Status   Adenovirus NOT DETECTED NOT DETECTED Final   Coronavirus 229E NOT DETECTED NOT DETECTED Final    Comment: (NOTE) The Coronavirus on the Respiratory Panel, DOES NOT test for the novel  Coronavirus (2019 nCoV)    Coronavirus HKU1 NOT DETECTED NOT DETECTED Final   Coronavirus NL63 NOT DETECTED NOT DETECTED Final   Coronavirus OC43 NOT DETECTED NOT DETECTED Final   Metapneumovirus NOT DETECTED NOT DETECTED Final   Rhinovirus / Enterovirus DETECTED (A) NOT DETECTED Final   Influenza A NOT DETECTED NOT DETECTED Final   Influenza B NOT DETECTED NOT  DETECTED Final   Parainfluenza Virus 1 NOT DETECTED NOT DETECTED Final   Parainfluenza Virus 2 NOT DETECTED NOT DETECTED Final   Parainfluenza Virus 3 NOT DETECTED NOT DETECTED Final   Parainfluenza Virus 4 NOT DETECTED NOT DETECTED Final   Respiratory Syncytial Virus NOT DETECTED NOT DETECTED Final   Bordetella pertussis NOT DETECTED NOT DETECTED Final   Bordetella Parapertussis NOT DETECTED NOT DETECTED Final   Chlamydophila pneumoniae NOT DETECTED NOT DETECTED Final   Mycoplasma pneumoniae NOT DETECTED NOT DETECTED Final    Comment: Performed at [facility], [address], [place], [place] [address]     Labs: BNP (last 3 results) No results for input(s): "BNP" in the last 8760 hours. Basic Metabolic Panel: Recent Labs  Lab 11/23/22 1937 11/23/22 2010 11/24/22 0340 11/25/22 0400 11/26/22 3086 11/27/22 0749 11/28/22 0809 11/29/22 0941 11/30/22 0545  NA  --   --  134* 134* 135 139  138 141 139  K  --   --  2.9* 4.1 3.9 4.5 2.8* 3.9 3.5  CL   < >  --  92* 93* 98 97* 96* 93* 93*  CO2   < >  --  23 28 28 31 30  32 33*  GLUCOSE   < >  --  491* 302* 232* 221* 134* 110* 141*  BUN   < >  --  18 26* 31* 32* 28* 23 22  CREATININE   < >  --  1.47* 1.23* 1.11* 1.18* 1.11* 1.00 1.09*  CALCIUM   < >  --  8.9 9.1 8.8* 9.1 8.9 9.4 9.1  MG  --  1.8 1.6* 2.0 2.2  --   --   --   --   PHOS  --   --  1.9*  --  3.7 3.2 3.9  --   --    < > = values in this interval not displayed.   Liver Function Tests: No results for input(s): "AST", "ALT", "ALKPHOS", "BILITOT", "PROT", "ALBUMIN" in the last 168 hours. No results for input(s): "LIPASE", "AMYLASE" in the last 168 hours. No results for input(s): "AMMONIA" in the last 168 hours. CBC: Recent Labs  Lab 11/26/22 0638 11/27/22 0749 11/28/22 0809 11/29/22 0941 11/30/22 0545  WBC 14.9* 13.0* 19.2* 15.1* 15.9*  HGB 13.5 12.9 13.6 13.8 13.8  HCT 40.3 39.2 42.2 42.4 42.6  MCV 92.9 93.8 94.6 93.8 95.7  PLT 385 365 373 354 365   Cardiac  Enzymes: No results for input(s): "CKTOTAL", "CKMB", "CKMBINDEX", "TROPONINI" in the last 168 hours. BNP: Invalid input(s): "POCBNP" CBG: Recent Labs  Lab 11/29/22 0850 11/29/22 1216 11/29/22 1612 11/29/22 2135 11/30/22 0739  GLUCAP 133* 169* 274* 278* 148*   D-Dimer No results for input(s): "DDIMER" in the last 72 hours. Hgb A1c No results for input(s): "HGBA1C" in the last 72 hours. Lipid Profile No results for input(s): "CHOL", "HDL", "LDLCALC", "TRIG", "CHOLHDL", "LDLDIRECT" in the last 72 hours. Thyroid function studies No results for input(s): "TSH", "T4TOTAL", "T3FREE", "THYROIDAB" in the last 72 hours.  Invalid input(s): "FREET3" Anemia work up No results for input(s): "VITAMINB12", "FOLATE", "FERRITIN", "TIBC", "IRON", "RETICCTPCT" in the last 72 hours. Urinalysis    Component Value Date/Time   COLORURINE YELLOW 08/14/2022 2025   APPEARANCEUR CLEAR 08/14/2022 2025   LABSPEC 1.013 08/14/2022 2025   PHURINE 6.0 08/14/2022 2025   GLUCOSEU >=500 (A) 08/14/2022 2025   HGBUR NEGATIVE 08/14/2022 2025   BILIRUBINUR negative 09/17/2022 1803   BILIRUBINUR negative 12/25/2019 1515   KETONESUR negative 09/17/2022 1803   KETONESUR NEGATIVE 08/14/2022 2025   PROTEINUR negative 09/17/2022 1803   PROTEINUR NEGATIVE 08/14/2022 2025   UROBILINOGEN 0.2 09/17/2022 1803   UROBILINOGEN 0.2 09/19/2008 1400   NITRITE Negative 09/17/2022 1803   NITRITE NEGATIVE 08/14/2022 2025   LEUKOCYTESUR Trace (A) 09/17/2022 1803   LEUKOCYTESUR MODERATE (A) 08/14/2022 2025   Sepsis Labs Recent Labs  Lab 11/27/22 0749 11/28/22 0809 11/29/22 0941 11/30/22 0545  WBC 13.0* 19.2* 15.1* 15.9*   Microbiology Recent Results (from the past 240 hour(s))  Resp panel by RT-PCR (RSV, Flu A&B, Covid) Anterior Nasal Swab     Status: None   Collection Time: 11/23/22  7:27 PM   Specimen: Anterior Nasal Swab  Result Value Ref Range Status   SARS Coronavirus 2 by RT PCR NEGATIVE NEGATIVE Final    Influenza A by PCR NEGATIVE NEGATIVE Final   Influenza B by PCR NEGATIVE NEGATIVE Final  Comment: (NOTE) The Xpert Xpress SARS-CoV-2/FLU/RSV plus assay is intended as an aid in the diagnosis of influenza from Nasopharyngeal swab specimens and should not be used as a sole basis for treatment. Nasal washings and aspirates are unacceptable for Xpert Xpress SARS-CoV-2/FLU/RSV testing.  Fact Sheet for Patients: BloggerCourse.com  Fact Sheet for Healthcare Providers: SeriousBroker.it  This test is not yet approved or cleared by the [place] FDA and has been authorized for detection and/or diagnosis of SARS-CoV-2 by FDA under an Emergency Use Authorization (EUA). This EUA will remain in effect (meaning this test can be used) for the duration of the COVID-19 declaration under Section 564(b)(1) of the Act, 21 U.S.C. section 360bbb-3(b)(1), unless the authorization is terminated or revoked.     Resp Syncytial Virus by PCR NEGATIVE NEGATIVE Final    Comment: (NOTE) Fact Sheet for Patients: BloggerCourse.com  Fact Sheet for Healthcare Providers: SeriousBroker.it  This test is not yet approved or cleared by the [place] FDA and has been authorized for detection and/or diagnosis of SARS-CoV-2 by FDA under an Emergency Use Authorization (EUA). This EUA will remain in effect (meaning this test can be used) for the duration of the COVID-19 declaration under Section 564(b)(1) of the Act, 21 U.S.C. section 360bbb-3(b)(1), unless the authorization is terminated or revoked.  Performed at [facility], [address] [place], [place] [address]   Expectorated Sputum Assessment w Gram Stain, Rflx to Resp Cult     Status: None   Collection Time: 11/23/22  9:18 PM   Specimen: Sputum  Result Value Ref Range Status   Specimen Description SPUTUM  Final   Special Requests NONE  Final    Sputum evaluation   Final    THIS SPECIMEN IS ACCEPTABLE FOR SPUTUM CULTURE Performed at [facility], [address], [place], [place] [address]    Report Status 11/25/2022 FINAL  Final  Culture, Respiratory w Gram Stain     Status: None   Collection Time: 11/23/22  9:18 PM   Specimen: SPU  Result Value Ref Range Status   Specimen Description SPUTUM  Final   Special Requests NONE Reflexed from [id number]  Final   Gram Stain   Final    RARE WBC PRESENT,BOTH PMN AND MONONUCLEAR RARE GRAM POSITIVE RODS    Culture   Final    Consistent with normal respiratory flora. No Pseudomonas species isolated Performed at [facility], [address] [place], [place] [address]    Report Status 11/27/2022 FINAL  Final  Respiratory (~20 pathogens) panel by PCR     Status: Abnormal   Collection Time: 11/24/22  2:37 AM   Specimen: Nasopharyngeal Swab; Respiratory  Result Value Ref Range Status   Adenovirus NOT DETECTED NOT DETECTED Final   Coronavirus 229E NOT DETECTED NOT DETECTED Final    Comment: (NOTE) The Coronavirus on the Respiratory Panel, DOES NOT test for the novel  Coronavirus (2019 nCoV)    Coronavirus HKU1 NOT DETECTED NOT DETECTED Final   Coronavirus NL63 NOT DETECTED NOT DETECTED Final   Coronavirus OC43 NOT DETECTED NOT DETECTED Final   Metapneumovirus NOT DETECTED NOT DETECTED Final   Rhinovirus / Enterovirus DETECTED (A) NOT DETECTED Final   Influenza A NOT DETECTED NOT DETECTED Final   Influenza B NOT DETECTED NOT DETECTED Final   Parainfluenza Virus 1 NOT DETECTED NOT DETECTED Final   Parainfluenza Virus 2 NOT DETECTED NOT DETECTED Final   Parainfluenza Virus 3 NOT DETECTED NOT DETECTED Final  Parainfluenza Virus 4 NOT DETECTED NOT DETECTED Final   Respiratory Syncytial Virus NOT DETECTED NOT DETECTED Final   Bordetella pertussis NOT DETECTED NOT DETECTED Final   Bordetella Parapertussis NOT DETECTED NOT DETECTED Final   Chlamydophila pneumoniae NOT DETECTED  NOT DETECTED Final   Mycoplasma pneumoniae NOT DETECTED NOT DETECTED Final    Comment: Performed at [facility], [address], [place], [place] [address]     Time coordinating discharge: Over 30 minutes  SIGNED:   Huey Bienenstock, MD  [facility] 11/30/2022, 5:09 PM Pager   If 7PM-7AM, please contact night-coverage [email or web address] Password [email or web address]

## 2022-12-02 ENCOUNTER — Encounter: Payer: Medicare HMO | Admitting: Occupational Therapy

## 2022-12-02 ENCOUNTER — Ambulatory Visit: Payer: Medicare HMO | Admitting: Physical Therapy

## 2022-12-03 ENCOUNTER — Telehealth: Payer: Self-pay

## 2022-12-03 NOTE — Transitions of Care (Post Inpatient/ED Visit) (Signed)
12/03/2022  Name: Kathleen Hatfield MRN: [id number] DOB: Nov 14, 1955  Today's TOC FU Call Status: Today's TOC FU Call Status:: Successful TOC FU Call Completed TOC FU Call Complete Date: 12/03/22 Patient's Name and Date of Birth confirmed.  Transition Care Management Follow-up Telephone Call Date of Discharge: 11/30/22 Discharge Facility: [facility]) Type of Discharge: Inpatient Admission Primary Inpatient Discharge Diagnosis:: Acute on Chronic Hypoxic Respiratory Failure- Rhinovirus How have you been since you were released from the hospital?: Better Any questions or concerns?: No  Items Reviewed: Did you receive and understand the discharge instructions provided?: Yes Medications obtained,verified, and reconciled?: Yes (Medications Reviewed) Any new allergies since your discharge?: No Dietary orders reviewed?: No Do you have support at home?: Yes People in Home: significant other Name of Support/Comfort Primary Source: Kathleen Hatfield  Medications Reviewed Today: Medications Reviewed Today     Reviewed by Kathleen Gross, RN (Case Manager) on 12/03/22 at 1523  Med List Status: <None>   Medication Order Taking? Sig Documenting Provider Last Dose Status Informant  acetaminophen (TYLENOL) 500 MG tablet [id number] Yes Take 500 mg by mouth every 6 (six) hours as needed for moderate pain. [provider] Taking Active Self  albuterol (VENTOLIN HFA) 108 (90 Base) MCG/ACT inhaler [id number] Yes INHALE TWO PUFFS BY MOUTH INTO LUNGS every SIX hours AS NEEDED  Patient taking differently: Inhale 2 puffs into the lungs See admin instructions. Inhale 2 puffs by mouth two to three times a day as needed for shortness of breath or wheezing   Kathleen Nam, MD Taking Active Self  Alirocumab (PRALUENT) 150 MG/ML Ivory Broad [id number] Yes Inject 1 mL (150 mg total) into the skin every 14 (fourteen) days. Kathleen Nam, MD Taking Active Self  aspirin EC 81 MG tablet [id number] Yes Take 1 tablet (81 mg  total) by mouth daily. Swallow whole. Kathleen Newcomer T, PA-C Taking Active Self  BAYER CONTOUR NEXT TEST test strip [id number] Yes 1 each by Other route daily. As directed [provider] Taking Active Self           Med Note Kathleen Hatfield, Kathleen Hatfield   Mon May 03, 2017 11:02 PM)    buPROPion (WELLBUTRIN XL) 300 MG 24 hr tablet [id number] Yes Take 1 tablet (300 mg total) by mouth every morning. Kathleen Nam, MD Taking Active Self  Cholecalciferol (VITAMIN D-3) 5000 UNITS TABS [id number] Yes Take 5,000 Units by mouth every other day.  [provider] Taking Active Self  colchicine 0.6 MG tablet [id number] Yes TAKE 1 TABLET (0.6 MG TOTAL) BY MOUTH DAILY AS NEEDED (FOR GOUT) Kathleen Nam, MD Taking Active Self  Continuous Blood Gluc Receiver [facility] G7 RECEIVER) DEVI [id number] No by Does not apply route.  Patient not taking: Reported on 12/03/2022   [provider] Not Taking Active Self           Med Note Electa Sniff [facility]   Thu Dec 03, 2022  3:22 PM) Patient needs to order battery  Continuous Blood Gluc Sensor (DEXCOM G7 SENSOR) MISC [id number]  1 Device by Does not apply route as directed. Apply sensor every 10 days Shamleffer, Konrad Dolores, MD  Active Self  dicyclomine (BENTYL) 20 MG tablet [id number] Yes TAKE 1 TABLET UP TO FOUR TIMES A DAY FOR GI CRAMPING, PAIN, NAUSEA, AND VOMITING AS NEEDED Kathleen Nam, MD Taking Active Self  DULoxetine (CYMBALTA) 60 MG capsule [id number] Yes TAKE ONE CAPSULE BY MOUTH EVERYDAY AT BEDTIME Kathleen Nam, MD Taking Active Self  estradiol (ESTRACE) 0.1 MG/GM vaginal cream [id number] Yes Place 1 Applicatorful vaginally 3 (three) times a week. [provider] Taking Active Self           Med Note Nedra Hai   Wed Nov 04, 2022  2:26 PM)    FEROSUL 325 (65 Fe) MG tablet [id number] Yes TAKE ONE TABLET BY MOUTH every other evening Kathleen Nam, MD Taking Active Self  fluticasone [facility]) 50 MCG/ACT nasal spray [id number]  Yes Place 2 sprays into both nostrils daily.  Patient taking differently: Place 2 sprays into both nostrils as needed.   Marita [place], PA-C Taking Active Self  furosemide (LASIX) 20 MG tablet [id number] Yes Take 3 tablets (60 mg total) by mouth 2 (two) times daily. Kathleen Newcomer T, PA-C Taking Active Self  gabapentin (NEURONTIN) 300 MG capsule [id number] Yes TAKE ONE CAPSULE BY MOUTH EVERYDAY AT BEDTIME May take additional capsule daily if needed Kathleen Nam, MD Taking Active Self           Med Note Roxan Hockey, [place] R   Wed Nov 04, 2022  2:26 PM)    insulin aspart (NOVOLOG FLEXPEN) 100 UNIT/ML FlexPen [id number] Yes Max daily 45 units  Patient taking differently: Inject 1-8 Units into the skin See admin instructions. Take 1-8 units into the skin three times a day before or after meals. Max daily 45 units   Shamleffer, Konrad Dolores, MD Taking Active Self           Med Note Anselm Pancoast R   Wed Nov 04, 2022  2:26 PM)    insulin degludec (TRESIBA FLEXTOUCH) 200 UNIT/ML FlexTouch Pen [id number] Yes Inject 50 Units into the skin daily. Elgergawy, Leana Roe, MD Taking Active   Insulin Pen Needle (GLOBAL EASE INJECT PEN NEEDLES) 32G X 4 MM MISC [id number] Yes 1 Device by Other route in the morning, at noon, in the evening, and at bedtime. Shamleffer, Konrad Dolores, MD Taking Active Self  ipratropium-albuterol (DUONEB) 0.5-2.5 (3) MG/3ML SOLN [id number] Yes Take 3 mLs by nebulization every 6 (six) hours as needed (wheezing). Coralyn Helling, MD Taking Active Self  ketoconazole (NIZORAL) 2 % cream [id number] Yes APPLY TO AFFECTED AREA TWICE A DAY  Patient taking differently: Apply 1 Application topically as needed for irritation.   Kathleen Nam, MD Taking Active Self           Med Note Anselm Pancoast R   Wed Nov 04, 2022  2:26 PM)    meloxicam (MOBIC) 15 MG tablet [id number] Yes TAKE 1 TABLET (15 MG TOTAL) BY MOUTH DAILY.  Patient taking differently: Take 15 mg by mouth daily as needed for pain.    Standiford, Jenelle Mages, DPM Taking Active Self  mesalamine (LIALDA) 1.2 g EC tablet [id number] Yes Take 2.4 g by mouth in the morning and at bedtime. [provider] Taking Active Self  metolazone (ZAROXOLYN) 5 MG tablet [id number] Yes TAKE 1 TABLET BY MOUTH EVERY DAY AS NEEDED Kathleen Nam, MD Taking Active Self  MICROLET LANCETS MISC [id number] Yes 1 each by Other route. As directed [provider] Taking Active Self           Med Note Sharlynn Oliphant Oct 21, 2015  3:31 PM)    nitrofurantoin (MACRODANTIN) 50 MG capsule [id number] Yes Take 50 mg by mouth at bedtime. [provider] Taking Active Self           Med Note (WHITE, TONIA S  Tue Nov 24, 2022  1:07 AM) Continuous therapy to "slow down" UTI  nystatin (MYCOSTATIN/NYSTOP) powder [id number] Yes Apply 1 application topically 3 (three) times daily.  Patient taking differently: Apply 1 application  topically as needed.   Kathleen Nam, MD Taking Active Self  ondansetron (ZOFRAN) 4 MG tablet [id number] Yes TAKE ONE TABLET BY MOUTH every EIGHT hours AS NEEDED FOR NAUSEA AND VOMITING Kathleen Nam, MD Taking Active Self  OXYGEN [id number] Yes Inhale 2 L into the lungs continuous. [provider] Taking Active Self  pantoprazole (PROTONIX) 40 MG tablet [id number] Yes TAKE ONE TABLET BY MOUTH EVERY MORNING Kathleen Nam, MD Taking Active Self  polyethylene glycol [facility] / GLYCOLAX) packet [id number] Yes Take 17 g by mouth daily as needed for mild constipation. [provider] Taking Active Self  potassium chloride (MICRO-K) 10 MEQ CR capsule [id number] Yes TAKE 5 CAPSULES BY MOUTH AT NOON  Patient taking differently: Take 10-20 mEq by mouth See admin instructions. Take 2 capsules by mouth in the morning, 1 capsule at noon, then 2 capsules in the evening   Kathleen Nam, MD Taking Active Self  predniSONE (STERAPRED UNI-PAK 21 TAB) 10 MG (21) TBPK tablet [id number] Yes Please use per package  instruction Elgergawy, Leana Roe, MD Taking Active   rOPINIRole (REQUIP) 4 MG tablet [id number] Yes TAKE 1 TABLET BY MOUTH EVERYDAY AT BEDTIME  Patient taking differently: Take 4 mg by mouth every evening.   Kathleen Nam, MD Taking Active Self  Semaglutide, 2 MG/DOSE, 8 MG/3ML SOPN [id number] Yes Inject 2 mg as directed once a week.  Patient taking differently: Inject 2 mg as directed once a week. Saturday   Kathleen Nam, MD Taking Active Self  simethicone (GAS-X) 80 MG chewable tablet [id number] Yes Chew 1 tablet (80 mg total) by mouth every 6 (six) hours as needed for flatulence. Kathleen Nam, MD Taking Active Self  spironolactone (ALDACTONE) 25 MG tablet [id number] Yes Take 0.5 tablets (12.5 mg total) by mouth daily. Kathleen Nam, MD Taking Active Self  traMADol Janean Sark) 50 MG tablet [id number] Yes Take 1 tablet (50 mg total) by mouth 3 (three) times daily as needed. Kathleen Nam, MD Taking Active Self  UNABLE TO FIND [id number] No CPAP- At bedtime  Patient not taking: Reported on 12/03/2022   [provider] Not Taking Active Self            Home Care and Equipment/Supplies: Were Home Health Services Ordered?: Yes Name of Home Health Agency:: [facility] Has Agency set up a time to come to your home?: Yes First Home Health Visit Date: 12/03/22 Any new equipment or medical supplies ordered?: No  Functional Questionnaire: Do you need assistance with bathing/showering or dressing?: No Do you need assistance with meal preparation?: No Do you need assistance with eating?: No Do you have difficulty maintaining continence: No Do you need assistance with getting out of bed/getting out of a chair/moving?: No Do you have difficulty managing or taking your medications?: No  Follow up appointments reviewed: PCP Follow-up appointment confirmed?: No MD Provider Line Number:[phone] Given: No (Patient refused care guide set up and will call herself) [facility]  Follow-up appointment confirmed?: NA Do you need transportation to your follow-up appointment?: No Do you understand care options if your condition(s) worsen?: Yes-patient verbalized understanding  SDOH Interventions Today    Flowsheet Row Most Recent Value  SDOH Interventions   Food Insecurity Interventions Intervention Not Indicated  Utilities Interventions Intervention  Not Indicated     Kathleen Gross RN, BSN, CCM [place]  Population Health RN Care Coordinator/ Transitions of Care

## 2022-12-04 ENCOUNTER — Telehealth: Payer: Self-pay | Admitting: Family Medicine

## 2022-12-04 DIAGNOSIS — G4733 Obstructive sleep apnea (adult) (pediatric): Secondary | ICD-10-CM | POA: Diagnosis not present

## 2022-12-04 NOTE — Telephone Encounter (Signed)
Home Health verbal orders Caller Name: Jon Gills Agency Name: [facility]  Callback number: [phone]  Requesting OT and orders for [facility] aide  Frequency: 1x a week for 4 weeks  Please forward to [facility] pool or providers CMA

## 2022-12-04 NOTE — Telephone Encounter (Signed)
Verbal orders given to [employer].

## 2022-12-04 NOTE — Telephone Encounter (Signed)
Please give the order.  Thanks.   

## 2022-12-06 DIAGNOSIS — E1165 Type 2 diabetes mellitus with hyperglycemia: Secondary | ICD-10-CM | POA: Diagnosis not present

## 2022-12-08 ENCOUNTER — Ambulatory Visit (INDEPENDENT_AMBULATORY_CARE_PROVIDER_SITE_OTHER): Payer: Medicare HMO | Admitting: Family Medicine

## 2022-12-08 ENCOUNTER — Telehealth: Payer: Self-pay | Admitting: Family Medicine

## 2022-12-08 ENCOUNTER — Encounter: Payer: Self-pay | Admitting: Family Medicine

## 2022-12-08 VITALS — BP 130/58 | HR 92 | Temp 97.9°F | Ht 65.0 in | Wt 259.0 lb

## 2022-12-08 DIAGNOSIS — J449 Chronic obstructive pulmonary disease, unspecified: Secondary | ICD-10-CM | POA: Diagnosis not present

## 2022-12-08 DIAGNOSIS — R5382 Chronic fatigue, unspecified: Secondary | ICD-10-CM | POA: Diagnosis not present

## 2022-12-08 MED ORDER — ROPINIROLE HCL 4 MG PO TABS: ORAL_TABLET | ORAL | 3 refills | Status: DC

## 2022-12-08 MED ORDER — BUPROPION HCL ER (XL) 300 MG PO TB24: 300.0000 mg | ORAL_TABLET | Freq: Every morning | ORAL | 1 refills | Status: DC

## 2022-12-08 MED ORDER — PRALUENT 150 MG/ML ~~LOC~~ SOAJ: 150.0000 mg | SUBCUTANEOUS | 1 refills | Status: DC

## 2022-12-08 MED ORDER — TRESIBA FLEXTOUCH 200 UNIT/ML ~~LOC~~ SOPN: 80.0000 [IU] | PEN_INJECTOR | Freq: Every day | SUBCUTANEOUS | Status: DC

## 2022-12-08 MED ORDER — MELOXICAM 15 MG PO TABS: 15.0000 mg | ORAL_TABLET | Freq: Every day | ORAL | Status: AC | PRN

## 2022-12-08 MED ORDER — DICYCLOMINE HCL 20 MG PO TABS: ORAL_TABLET | ORAL | 1 refills | Status: DC

## 2022-12-08 MED ORDER — NOVOLOG FLEXPEN 100 UNIT/ML ~~LOC~~ SOPN: 1.0000 [IU] | PEN_INJECTOR | SUBCUTANEOUS | Status: DC

## 2022-12-08 MED ORDER — DULOXETINE HCL 60 MG PO CPEP: 60.0000 mg | ORAL_CAPSULE | Freq: Every day | ORAL | 3 refills | Status: DC

## 2022-12-08 MED ORDER — KETOCONAZOLE 2 % EX CREA: 1.0000 | TOPICAL_CREAM | CUTANEOUS | Status: DC | PRN

## 2022-12-08 MED ORDER — GABAPENTIN 300 MG PO CAPS: 300.0000 mg | ORAL_CAPSULE | Freq: Two times a day (BID) | ORAL | 1 refills | Status: DC | PRN

## 2022-12-08 MED ORDER — ALBUTEROL SULFATE HFA 108 (90 BASE) MCG/ACT IN AERS: 2.0000 | INHALATION_SPRAY | RESPIRATORY_TRACT | 3 refills | Status: DC

## 2022-12-08 MED ORDER — COLCHICINE 0.6 MG PO TABS: 0.6000 mg | ORAL_TABLET | Freq: Every day | ORAL | 1 refills | Status: DC | PRN

## 2022-12-08 MED ORDER — FLUTICASONE PROPIONATE 50 MCG/ACT NA SUSP: 2.0000 | NASAL | Status: DC | PRN

## 2022-12-08 MED ORDER — POTASSIUM CHLORIDE ER 10 MEQ PO CPCR: ORAL_CAPSULE | ORAL | 1 refills | Status: DC

## 2022-12-08 MED ORDER — FERROUS SULFATE 325 (65 FE) MG PO TABS
325.0000 mg | ORAL_TABLET | ORAL | 3 refills | Status: DC
Start: 2022-12-08 — End: 2023-01-21

## 2022-12-08 NOTE — Telephone Encounter (Signed)
Please give the order for both.  Thanks.

## 2022-12-08 NOTE — Progress Notes (Unsigned)
Inpatient f/u.  Inpatient course discussed with patient.  Admitted with COPD exacerbation with rhinovirus.  History of SOBOE, even with O2.  Still with cough. Fatigued. Sleep disrupted from cough.  D/w pt about flutter valve and IS use.  Flutter valve helps with sputum production.  Sputum is yellowish, not yet clear.  No known fevers but some sweats. Still on O2.  See notes on follow-up labs.  She needed refills sent to pharmacy.  D/w pt.  She had tolerated praluent.    Insulin dosing d/w pt.  See orders.  Sugars have been in the upper 200s, lower 300s.  Tapering prednisone.    She has historically not tolerated powdered inhalers.  D/w pt.   Mood discussed with patient.  No suicidal homicidal intent but she clearly feels frustrated with her recent/chronic illnesses and relative debility.  Meds, vitals, and allergies reviewed.   ROS: Per HPI unless specifically indicated in ROS section   Nad but appears fatigued.  On O2 via nasal cannula. Neck supple, no LA rrr Cough but o/w CTAB.   Abdomen soft.  Skin well-perfused. Trace BLE

## 2022-12-08 NOTE — Patient Instructions (Addendum)
Go to the lab on the way out.   If you have mychart we'll likely use that to update you.    Take care.  Glad to see you.  Please update me about your sugar and insulin doses in about 1 week, when off prednisone.

## 2022-12-08 NOTE — Telephone Encounter (Signed)
Kathleen Hatfield called in and stated that they would like to add a [facility] aide as well.   Frequency:  1x a week for 1 week/ 2x a week for 3 weeks/ 1x a week for 3 weeks

## 2022-12-08 NOTE — Telephone Encounter (Signed)
Verbal orders given  

## 2022-12-08 NOTE — Telephone Encounter (Signed)
Home Health verbal orders Caller Name: Rodena Goldmann Agency Name: [facility] number: [phone]  Requesting evaluation for Skilled nursing to help with diabetes and disease management.   Please forward to [facility] pool or providers CMA

## 2022-12-09 ENCOUNTER — Telehealth: Payer: Self-pay | Admitting: Family Medicine

## 2022-12-09 DIAGNOSIS — J449 Chronic obstructive pulmonary disease, unspecified: Secondary | ICD-10-CM

## 2022-12-09 LAB — BASIC METABOLIC PANEL
BUN: 26 mg/dL — ABNORMAL HIGH (ref 6–23)
CO2: 31 meq/L (ref 19–32)
Calcium: 9.7 mg/dL (ref 8.4–10.5)
Chloride: 92 meq/L — ABNORMAL LOW (ref 96–112)
Creatinine, Ser: 1.1 mg/dL (ref 0.40–1.20)
GFR: 51.85 mL/min — ABNORMAL LOW (ref >60.00)
Glucose, Bld: 471 mg/dL — ABNORMAL HIGH (ref 70–99)
Potassium: 4.4 meq/L (ref 3.5–5.1)
Sodium: 132 meq/L — ABNORMAL LOW (ref 135–145)

## 2022-12-09 LAB — CBC WITH DIFFERENTIAL/PLATELET
Basophils Absolute: 0.1 10*3/uL (ref 0.0–0.1)
Basophils Relative: 0.4 % (ref 0.0–3.0)
Eosinophils Absolute: 0.1 10*3/uL (ref 0.0–0.7)
Eosinophils Relative: 0.8 % (ref 0.0–5.0)
HCT: 41.2 % (ref 36.0–46.0)
Hemoglobin: 13.1 g/dL (ref 12.0–15.0)
Lymphocytes Relative: 16.8 % (ref 12.0–46.0)
Lymphs Abs: 2.2 10*3/uL (ref 0.7–4.0)
MCHC: 31.7 g/dL (ref 30.0–36.0)
MCV: 95.5 fL (ref 78.0–100.0)
Monocytes Absolute: 0.7 10*3/uL (ref 0.1–1.0)
Monocytes Relative: 5.5 % (ref 3.0–12.0)
Neutro Abs: 10.3 10*3/uL — ABNORMAL HIGH (ref 1.4–7.7)
Neutrophils Relative %: 76.5 % (ref 43.0–77.0)
Platelets: 307 10*3/uL (ref 150.0–400.0)
RBC: 4.31 Mil/uL (ref 3.87–5.11)
RDW: 14.5 % (ref 11.5–15.5)
WBC: 13.4 10*3/uL — ABNORMAL HIGH (ref 4.0–10.5)

## 2022-12-09 MED ORDER — DOXYCYCLINE HYCLATE 100 MG PO TABS: 100.0000 mg | ORAL_TABLET | Freq: Two times a day (BID) | ORAL | 0 refills | Status: DC

## 2022-12-09 NOTE — Assessment & Plan Note (Signed)
Discussed reestablishing with pulmonary.  See orders.  She is in the midst of prednisone taper and may be able to get better glycemic control without increasing her insulin if she requires less prednisone in the near future.  She can update me about her insulin/sugars in a few days.  If she does not continue to improve then she may need more aggressive treatment regarding COPD exacerbation.  She has prev prednisone rx at home.  If better with current taper, then don't restart that medication If not better, then she can restart prednisone taper and update [place].  She is off antibiotics at this point.  At this point still okay for outpatient follow-up.  41 minutes were devoted to patient care in this encounter (this includes time spent reviewing the patient's file/history, interviewing and examining the patient, counseling/reviewing plan with patient).

## 2022-12-09 NOTE — Telephone Encounter (Signed)
I put in a new referral.   She should have another prednisone on hand.  Would use that in the meantime and add on doxycycline.  Doxy rx sent.  Needs recheck if not better in the meantime.   She may have to adjust her basal insulin upward but 1-2 units per day while on prednisone, then taper when off.    Please update me about sugars/insulin dose/condition in a few days.  Thanks.

## 2022-12-09 NOTE — Telephone Encounter (Signed)
Patient contacted the office regarding referral for Pulmonology, states that Dr. Craige Cotta is no longer in their office and works for [employer] now. Patient wanted to know if [facility] March could modify the referral for her to see someone else I the pulmonary office. Patient also says she was coughing a lot last night and now has some back pain, pt says she is coughing up green mucus as well.

## 2022-12-09 NOTE — Telephone Encounter (Signed)
Patient notified referral done and rx sent. Advised to be rechecked if no better or gets worse.

## 2022-12-10 ENCOUNTER — Ambulatory Visit: Payer: Medicare HMO | Admitting: Nurse Practitioner

## 2022-12-10 ENCOUNTER — Encounter: Payer: Self-pay | Admitting: Nurse Practitioner

## 2022-12-10 VITALS — BP 118/80 | HR 91 | Resp 16 | Ht 65.0 in | Wt 260.2 lb

## 2022-12-10 DIAGNOSIS — J4489 Other specified chronic obstructive pulmonary disease: Secondary | ICD-10-CM

## 2022-12-10 DIAGNOSIS — G4733 Obstructive sleep apnea (adult) (pediatric): Secondary | ICD-10-CM | POA: Diagnosis not present

## 2022-12-10 DIAGNOSIS — J209 Acute bronchitis, unspecified: Secondary | ICD-10-CM

## 2022-12-10 DIAGNOSIS — J9611 Chronic respiratory failure with hypoxia: Secondary | ICD-10-CM

## 2022-12-10 DIAGNOSIS — J440 Chronic obstructive pulmonary disease with acute lower respiratory infection: Secondary | ICD-10-CM

## 2022-12-10 MED ORDER — METHYLPREDNISOLONE ACETATE 80 MG/ML IJ SUSP
80.0000 mg | Freq: Once | INTRAMUSCULAR | Status: AC
Start: 2022-12-10 — End: 2022-12-10
  Administered 2022-12-10: 80 mg via INTRAMUSCULAR

## 2022-12-10 MED ORDER — BREZTRI AEROSPHERE 160-9-4.8 MCG/ACT IN AERO
2.0000 | INHALATION_SPRAY | Freq: Two times a day (BID) | RESPIRATORY_TRACT | Status: DC
Start: 2022-12-10 — End: 2023-01-21

## 2022-12-10 MED ORDER — SPACER/AERO-HOLDING CHAMBERS DEVI
1 refills | Status: DC
Start: 2022-12-10 — End: 2023-01-21

## 2022-12-10 MED ORDER — HYDROCODONE BIT-HOMATROP MBR 5-1.5 MG/5ML PO SOLN: 5.0000 mL | Freq: Three times a day (TID) | ORAL | 0 refills | Status: DC | PRN

## 2022-12-10 MED ORDER — ARFORMOTEROL TARTRATE 15 MCG/2ML IN NEBU
15.0000 ug | INHALATION_SOLUTION | Freq: Two times a day (BID) | RESPIRATORY_TRACT | 3 refills | Status: DC
Start: 2022-12-10 — End: 2023-12-06

## 2022-12-10 MED ORDER — PREDNISONE 10 MG PO TABS
ORAL_TABLET | ORAL | 0 refills | Status: DC
Start: 2022-12-10 — End: 2023-01-21

## 2022-12-10 MED ORDER — REVEFENACIN 175 MCG/3ML IN SOLN
175.0000 ug | Freq: Every day | RESPIRATORY_TRACT | 3 refills | Status: DC
Start: 2022-12-10 — End: 2023-03-18

## 2022-12-10 MED ORDER — BUDESONIDE 0.5 MG/2ML IN SUSP
0.5000 mg | Freq: Two times a day (BID) | RESPIRATORY_TRACT | 3 refills | Status: DC
Start: 2022-12-10 — End: 2023-12-06

## 2022-12-10 NOTE — Assessment & Plan Note (Addendum)
Unresolved bronchitic illness/AECOPD.  She has already been started on empiric doxycycline.  Previous CXR without superimposed infection.  Will treat her with Depo 80 mg injection x 1 and lower dose prednisone taper given difficulties with blood sugar.  Advised her to monitor these closely.  Initiate cough control and mucociliary clearance therapies.  Action plan in place.  Strict return/ED precautions. She is currently not on any maintenance therapy, adding to difficulties controlling her COPD/asthma.  Instructed on the importance of remaining on maintenance therapies.  She has difficulties tolerating inhalers.  Her PCP did try her on Breztri in May but she felt like it irritated the back of her throat.  Never tried this with a spacer.  She had been on nebulized medications in the past but does not remember exactly when she ran out of these.  Would be willing to restart if affordable.  Order sent to [facility] to restart budesonide, Yupelri and Brovana nebs.  In the interim, provided with samples of Breztri and order for spacer sent to the pharmacy.  Teach back performed.  Aware of side effect profile.  Will let us know if nebulized medications are not affordable or if she would rather continue on Breztri.  Understands to stop Breztri when she receives nebulized meds.  Patient Instructions  Continue Albuterol inhaler 2 puffs or 3 mL neb every 6 hours as needed for shortness of breath or wheezing. Notify if symptoms persist despite rescue inhaler/neb use. Use nebs at least three times a day until symptoms improve. Follow with flutter valve 10 times Continue flonase nasal spray 2 sprays each nostril daily Continue supplemental oxygen 2 lpm for goal >88-90% Continue doxycycline as previously prescribed  Restart Breztri 2 puffs Twice daily. Brush tongue and rinse mouth afterwards. Use with spacer.  We will work on getting your nebulized medications back. These will be sent through [facility]. Once you receive  them, you will stop the [facility] then start.... Budesonide 2 mL neb Twice daily. Brush tongue and rinse mouth afterwards  Brovana 2 mL neb neb Twice daily Yupelri 3 mL daily  -Prednisone 20 mg for 5 days then 10 mg for 5 days. Take in AM with food. Monitor your blood sugars carefully. If you are staying >300 despite insulin use, you need to go to the emergency department  -Use benzonatate 1 capsule Three times a day for cough -Hycodcan cough syrup 5 mL every 8 hours as needed for cough. May cause drowsiness. Do not drive after taking. Do not take within 4 hours of your tramadol  -Guaifenesin (mucinex) 600 mg Twice daily for cough/congestion  Orders sent for new CPAP    Follow up in 2 weeks with any doctor in new pt slot or Katie Shelina Luo,NP. If symptoms do not improve or worsen, please contact office for sooner follow up or seek emergency care.

## 2022-12-10 NOTE — Patient Instructions (Addendum)
Continue Albuterol inhaler 2 puffs or 3 mL neb every 6 hours as needed for shortness of breath or wheezing. Notify if symptoms persist despite rescue inhaler/neb use. Use nebs at least three times a day until symptoms improve. Follow with flutter valve 10 times Continue flonase nasal spray 2 sprays each nostril daily Continue supplemental oxygen 2 lpm for goal >88-90% Continue doxycycline as previously prescribed  Restart Breztri 2 puffs Twice daily. Brush tongue and rinse mouth afterwards. Use with spacer.  We will work on getting your nebulized medications back. These will be sent through [facility]. Once you receive them, you will stop the [facility] then start.... Budesonide 2 mL neb Twice daily. Brush tongue and rinse mouth afterwards  Brovana 2 mL neb neb Twice daily Yupelri 3 mL daily  -Prednisone 20 mg for 5 days then 10 mg for 5 days. Take in AM with food. Monitor your blood sugars carefully. If you are staying >300 despite insulin use, you need to go to the emergency department  -Use benzonatate 1 capsule Three times a day for cough -Hycodcan cough syrup 5 mL every 8 hours as needed for cough. May cause drowsiness. Do not drive after taking. Do not take within 4 hours of your tramadol  -Guaifenesin (mucinex) 600 mg Twice daily for cough/congestion  Orders sent for new CPAP    Follow up in 2 weeks with any doctor in new pt slot or Katie Jordon Kristiansen,NP. If symptoms do not improve or worsen, please contact office for sooner follow up or seek emergency care.

## 2022-12-10 NOTE — Assessment & Plan Note (Signed)
Stable without increased O2 requirement.  Goal greater than 80 to 90%.

## 2022-12-10 NOTE — Progress Notes (Signed)
@Patient  ID: Kathleen Hatfield, female    DOB: [birth year], 67 y.o.   MRN: [id number]  Chief Complaint  Patient presents with   Follow-up    OSA on CPAP    Referring provider: Joaquim Nam, MD  HPI: 67 year old female, never smoker followed for OSA on CPAP, COPD/asthma, chronic respiratory failure on supplemental O2.  She is a patient of Dr. Evlyn Courier and last seen in office 08/27/2021. Past medical history significant for HFpEF, HTN, GERD, UC, DM, CKD, HLD.   TEST/EVENTS:  09/01/2012 PSG: AHI 36.1 04/05/2016 V/Q scan: Normal 03/14/2017 ONO with CPAP/ra >> average 93%, SpO2 low 88% 07/24/2019 PFT: FVC 46, FEV1 35, ratio 67, TLC 93, DLCOcor 88.  Severe obstructive lung disease with reversibility  08/27/2021: OV with Dr. Craige Cotta.  Gets chest tightness.  Feels better after using her nebulizer and CPAP.  Does not have Perforomist.  Has occasional cough.  Using CPAP nightly.  Pressure setting is comfortable.  Walked 1 lap in office; maintained SpO2 but had to stop early due to feeling short of breath.  Difficulties using inhalers in the past.  Currently on Yupelri and Pulmicort.  Advised to restart Perforomist.  Order sent.  Has difficulties with tremor and question RLS component.  Previously seen by neurology.  Advised to follow-up with them for further workup.  12/10/2022: Today-follow-up Patient presents today for hospital follow-up.  She was recently admitted from 11/23/2022 to 11/30/2022 due to acute on chronic respiratory failure and AECOPD secondary to rhinovirus infection.  She initially required BiPAP therapy.  Was treated with IV steroids and antibiotics with clinical improvement.  She was able to be weaned back to her baseline of 2 L/min supplemental O2.  She was discharged on prednisone taper.  No additional antibiotics were prescribed. Today, she tells me that she feels relatively unchanged compared to when she was discharged.  She took her last dose of prednisone.  She still having a productive cough  with yellow to green phlegm.  Her PCP sent in doxycycline yesterday which she just started.  She still feels like she is more short winded and is having chest tightness.  Has a paroxysmal cough that is keeping her up at night.  Denies any fevers, chills, hemoptysis, increased lower extremity swelling, chest pain.  Having difficulties wearing her CPAP at night due to the cough.  She does also need a new CPAP machine.  Has been having some difficulties with the machine itself.  She has been off therapy for the last few weeks due to being in the hospital, the difficulties with the cough and now she has a piece of her mask that is broken.  Having fatigue during the day.  She does not drive currently.  No sleep parasomnia/paralysis.  Allergies  Allergen Reactions   Almond Oil Anaphylaxis, Shortness Of Breath and Swelling   Morphine And Codeine Shortness Of Breath and Other (See Comments)    Pt. States while in the hospital it affected her breathing, O2 dropped to the 70's   Primidone Shortness Of Breath        Atorvastatin Other (See Comments)    Leg weakness   Ceclor [Cefaclor] Nausea And Vomiting   Sulfa Antibiotics Nausea And Vomiting   Ciprofloxacin Other (See Comments)    Makes joints and muscles ache   Levaquin [Levofloxacin] Other (See Comments)    Body aches   Losartan Other (See Comments)    Weakness    Peanut-Containing Drug Products  Pt states she thinks it is causing gout flare-ups   Repatha [Evolocumab]     Aches   Statins Other (See Comments)    Leg and body weakness    Immunization History  Administered Date(s) Administered   Fluad Quad(high Dose 65+) 12/31/2021   Influenza Split 12/01/2012, 01/18/2017   Influenza Whole 12/25/2011   Influenza,inj,Quad PF,6+ Mos 11/27/2013, 12/08/2014, 12/11/2015   Influenza-Unspecified 12/07/2017   Janssen (J&J) SARS-COV-2 Vaccination 06/15/2019   Pneumococcal Conjugate-13 02/17/2017   Pneumococcal Polysaccharide-23 11/27/2013    Tdap 04/01/2010   Zoster Recombinant(Shingrix) 10/06/2021, 12/31/2021    Past Medical History:  Diagnosis Date   Anemia    Arthritis    Asthma    Chronic diastolic CHF 05/2014   Echocardiogram 05/2019: EF 70, no RWMA, mild LVH, Gr 1 DD, normal RVSF, severe LVH, borderline asc Aorta (39 mm)   CKD (chronic kidney disease)    Depression    Diabetes mellitus without complication (HCC)    Diverticulitis    Dyspnea    with exertion   GERD (gastroesophageal reflux disease)    History of blood transfusion    Hyperlipidemia    cannot tolerate statins   Hypertension    patient states she has never had high blood pressure.    Nuclear stress test    Myoview 05/2019: EF 83, no ischemia or infarction; Low Risk   Peripheral neuropathy    Pneumonia    PONV (postoperative nausea and vomiting)    RLS (restless legs syndrome)    Sleep apnea    uses CPAP   Ulcerative colitis (HCC)    dr Loreta [address]    Tobacco History: Social History   Tobacco Use  Smoking Status Never   Passive exposure: Past  Smokeless Tobacco Never   Counseling given: Not Answered   Outpatient Medications Prior to Visit  Medication Sig Dispense Refill   acetaminophen (TYLENOL) 500 MG tablet Take 500 mg by mouth every 6 (six) hours as needed for moderate pain.     albuterol (VENTOLIN HFA) 108 (90 Base) MCG/ACT inhaler Inhale 2 puffs into the lungs See admin instructions. Inhale 2 puffs by mouth two to three times a day as needed for shortness of breath or wheezing 24 g 3   Alirocumab (PRALUENT) 150 MG/ML SOAJ Inject 1 mL (150 mg total) into the skin every 14 (fourteen) days. 6 mL 1   aspirin EC 81 MG tablet Take 1 tablet (81 mg total) by mouth daily. Swallow whole. 90 tablet 3   BAYER CONTOUR NEXT TEST test strip 1 each by Other route daily. As directed     buPROPion (WELLBUTRIN XL) 300 MG 24 hr tablet Take 1 tablet (300 mg total) by mouth every morning. 90 tablet 1   Cholecalciferol (VITAMIN D-3) 5000 UNITS TABS Take 5,000  Units by mouth every other day.      colchicine 0.6 MG tablet Take 1 tablet (0.6 mg total) by mouth daily as needed (for gout). 30 tablet 1   Continuous Blood Gluc Receiver (DEXCOM G7 RECEIVER) DEVI by Does not apply route.     Continuous Blood Gluc Sensor (DEXCOM G7 SENSOR) MISC 1 Device by Does not apply route as directed. Apply sensor every 10 days 9 each 3   dicyclomine (BENTYL) 20 MG tablet TAKE 1 TABLET UP TO FOUR TIMES A DAY FOR GI CRAMPING, PAIN, NAUSEA, AND VOMITING AS NEEDED 60 tablet 1   doxycycline (VIBRA-TABS) 100 MG tablet Take 1 tablet (100 mg total) by mouth 2 (two)  times daily. 14 tablet 0   DULoxetine (CYMBALTA) 60 MG capsule Take 1 capsule (60 mg total) by mouth daily. 90 capsule 3   estradiol (ESTRACE) 0.1 MG/GM vaginal cream Place 1 Applicatorful vaginally 3 (three) times a week.     ferrous sulfate (FEROSUL) 325 (65 FE) MG tablet Take 1 tablet (325 mg total) by mouth every other day. Dispense as a capsule if possible. 45 tablet 3   fluticasone (FLONASE) 50 MCG/ACT nasal spray Place 2 sprays into both nostrils as needed.     furosemide (LASIX) 20 MG tablet Take 3 tablets (60 mg total) by mouth 2 (two) times daily. 540 tablet 3   gabapentin (NEURONTIN) 300 MG capsule Take 1 capsule (300 mg total) by mouth 2 (two) times daily as needed. 180 capsule 1   insulin aspart (NOVOLOG FLEXPEN) 100 UNIT/ML FlexPen Inject 1-8 Units into the skin See admin instructions. Per sliding scale.  Max daily 45 units     insulin degludec (TRESIBA FLEXTOUCH) 200 UNIT/ML FlexTouch Pen Inject 80 Units into the skin daily.     Insulin Pen Needle (GLOBAL EASE INJECT PEN NEEDLES) 32G X 4 MM MISC 1 Device by Other route in the morning, at noon, in the evening, and at bedtime. 400 each 3   ipratropium-albuterol (DUONEB) 0.5-2.5 (3) MG/3ML SOLN Take 3 mLs by nebulization every 6 (six) hours as needed (wheezing). 360 mL 6   ketoconazole (NIZORAL) 2 % cream Apply 1 Application topically as needed for irritation.      meloxicam (MOBIC) 15 MG tablet Take 1 tablet (15 mg total) by mouth daily as needed for pain.     mesalamine (LIALDA) 1.2 g EC tablet Take 2.4 g by mouth in the morning and at bedtime.     metolazone (ZAROXOLYN) 5 MG tablet TAKE 1 TABLET BY MOUTH EVERY DAY AS NEEDED 30 tablet 0   MICROLET LANCETS MISC 1 each by Other route. As directed     nitrofurantoin (MACRODANTIN) 50 MG capsule Take 50 mg by mouth at bedtime.     nystatin (MYCOSTATIN/NYSTOP) powder Apply 1 application topically 3 (three) times daily. (Patient taking differently: Apply 1 application  topically as needed.) 60 g 1   ondansetron (ZOFRAN) 4 MG tablet TAKE ONE TABLET BY MOUTH every EIGHT hours AS NEEDED FOR NAUSEA AND VOMITING 20 tablet 3   OXYGEN Inhale 2 L into the lungs continuous.     pantoprazole (PROTONIX) 40 MG tablet TAKE ONE TABLET BY MOUTH EVERY MORNING 90 tablet 1   polyethylene glycol (MIRALAX / GLYCOLAX) packet Take 17 g by mouth daily as needed for mild constipation.     potassium chloride (MICRO-K) 10 MEQ CR capsule Take 2 capsules by mouth in the morning, 1 capsule at noon, then 2 capsules in the evening 450 capsule 1   rOPINIRole (REQUIP) 4 MG tablet TAKE 1 TABLET BY MOUTH EVERYDAY AT BEDTIME 90 tablet 3   Semaglutide, 2 MG/DOSE, 8 MG/3ML SOPN Inject 2 mg as directed once a week. (Patient taking differently: Inject 2 mg as directed once a week. Saturday) 3 mL 5   simethicone (GAS-X) 80 MG chewable tablet Chew 1 tablet (80 mg total) by mouth every 6 (six) hours as needed for flatulence. 60 tablet 0   spironolactone (ALDACTONE) 25 MG tablet Take 0.5 tablets (12.5 mg total) by mouth daily. 45 tablet 1   traMADol (ULTRAM) 50 MG tablet Take 1 tablet (50 mg total) by mouth 3 (three) times daily as needed. 30 tablet 1  UNABLE TO FIND CPAP- At bedtime     predniSONE (STERAPRED UNI-PAK 21 TAB) 10 MG (21) TBPK tablet Please use per package instruction 21 tablet 0   No facility-administered medications prior to visit.      Review of Systems:   Constitutional: No weight loss or gain, night sweats, fevers, chills, or lassitude. +fatigue  HEENT: No headaches, difficulty swallowing, tooth/dental problems, or sore throat. No sneezing, itching, ear ache, nasal congestion, or post nasal drip CV:  No chest pain, orthopnea, PND, swelling in lower extremities, anasarca, dizziness, palpitations, syncope Resp: +shortness of breath with exertion; paroxysmal cough; wheeze; chest tightness. No hemoptysis.  No chest wall deformity GI:  No heartburn, indigestion, abdominal pain, nausea, vomiting, diarrhea, change in bowel habits, loss of appetite, bloody stools.  GU: No dysuria, change in color of urine, urgency or frequency.  Skin: No rash, lesions, ulcerations MSK:  +chronic joint pain  Neuro: No dizziness or lightheadedness.  Psych: No depression or anxiety. Mood stable.     Physical Exam:  BP 118/80   Pulse 91   Resp 16   Ht 5\' 5"  (1.651 m)   Wt 260 lb 3.2 oz (118 kg)   SpO2 97%   BMI 43.30 kg/m   GEN: Pleasant, interactive, chronically-ill appearing; morbidly obese; in no acute distress. HEENT:  Normocephalic and atraumatic. EACs patent bilaterally. TM pearly gray with present light reflex bilaterally. PERRLA. Sclera white. Nasal turbinates erythematous, moist and patent bilaterally. No rhinorrhea present. Oropharynx pink and moist, without exudate or edema. No lesions, ulcerations, or postnasal drip.  NECK:  Supple w/ fair ROM. No JVD present. Normal carotid impulses w/o bruits. Thyroid symmetrical with no goiter or nodules palpated. No lymphadenopathy.   CV: RRR, no m/r/g, no peripheral edema. Pulses intact, +2 bilaterally. No cyanosis, pallor or clubbing. PULMONARY:  Unlabored, regular breathing. Scattered rhonchi with end expiratory wheezes bilaterally A&P. Bronchitic cough. No accessory muscle use.  GI: BS present and normoactive. Soft, non-tender to palpation. No organomegaly or masses detected.  MSK:  No erythema, warmth or tenderness. Cap refil <2 sec all extrem. No deformities or joint swelling noted.  Neuro: A/Ox3. No focal deficits noted.   Skin: Warm, no lesions or rashe Psych: Normal affect and behavior. Judgement and thought content appropriate.     Lab Results:  CBC    Component Value Date/Time   WBC 13.4 (H) 12/08/2022 1514   RBC 4.31 12/08/2022 1514   HGB 13.1 12/08/2022 1514   HGB 14.1 02/25/2022 1031   HCT 41.2 12/08/2022 1514   HCT 44.7 02/25/2022 1031   PLT 307.0 12/08/2022 1514   PLT 275 02/25/2022 1031   MCV 95.5 12/08/2022 1514   MCV 91 02/25/2022 1031   MCH 31.0 11/30/2022 0545   MCHC 31.7 12/08/2022 1514   RDW 14.5 12/08/2022 1514   RDW 12.9 02/25/2022 1031   LYMPHSABS 2.2 12/08/2022 1514   MONOABS 0.7 12/08/2022 1514   EOSABS 0.1 12/08/2022 1514   BASOSABS 0.1 12/08/2022 1514    BMET    Component Value Date/Time   NA 132 (L) 12/08/2022 1514   NA 143 11/04/2022 1527   NA 137 09/04/2019 0000   K 4.4 12/08/2022 1514   K 3.6 09/04/2019 0000   CL 92 (L) 12/08/2022 1514   CL 89 09/04/2019 0000   CO2 31 12/08/2022 1514   CO2 38 09/04/2019 0000   GLUCOSE 471 (H) 12/08/2022 1514   BUN 26 (H) 12/08/2022 1514   BUN 30 (H) 11/04/2022 1527  CREATININE 1.10 12/08/2022 1514   CREATININE 1.57 (H) 06/26/2022 1520   CALCIUM 9.7 12/08/2022 1514   CALCIUM 10.1 09/04/2019 0000   GFRNONAA 56 (L) 11/30/2022 0545   GFRAA 69 01/19/2020 1356    BNP    Component Value Date/Time   BNP 40.3 02/14/2021 0109   BNP 45 02/07/2021 1545     Imaging:  DG Chest 2 View  Result Date: 11/23/2022 CLINICAL DATA:  Short of breath and chest pain EXAM: CHEST - 2 VIEW COMPARISON:  11/23/2022 FINDINGS: Frontal and lateral views of the chest demonstrate an unremarkable cardiac silhouette. No acute airspace disease, effusion, or pneumothorax. No acute bony abnormalities. IMPRESSION: 1. No acute intrathoracic process. Electronically Signed   By: Sharlet Salina M.D.   On:  11/23/2022 17:19   DG Chest 2 View  Result Date: 11/23/2022 CLINICAL DATA:  Shortness of breath EXAM: CHEST - 2 VIEW COMPARISON:  09/21/2022 FINDINGS: The heart size and mediastinal contours are stable. Coarsened interstitial markings bilaterally. No new airspace consolidation. No pleural effusion or pneumothorax. The visualized skeletal structures are unremarkable. IMPRESSION: Coarsened interstitial markings bilaterally, which may reflect chronic interstitial lung disease. No new airspace consolidation. Electronically Signed   By: Duanne Guess D.O.   On: 11/23/2022 13:32    methylPREDNISolone acetate (DEPO-MEDROL) injection 80 mg     Date Action Dose Route User   12/10/2022 1528 Given 80 mg Intramuscular (Left Deltoid) Jama Flavors, CMA          Latest Ref Rng & Units 07/24/2019    9:40 AM 07/02/2015    1:10 PM 07/12/2012    1:04 PM  PFT Results  FVC-Pre L 1.53  1.85    FVC-Predicted Pre % 46  53  57   FVC-Post L 1.62  2.18  2.20   FVC-Predicted Post % 48  63  65   Pre FEV1/FVC % % 59  65  60   Post FEV1/FCV % % 67  69  66   FEV1-Pre L 0.91  1.20  1.18   FEV1-Predicted Pre % 35  45  45   FEV1-Post L 1.09  1.50  1.44   DLCO uncorrected ml/min/mmHg 17.94  15.55  20.96   DLCO UNC% % 87  60  86   DLCO corrected ml/min/mmHg 18.17  17.09    DLCO COR %Predicted % 88  66    DLVA Predicted % 149  99  114   TLC L 4.85  4.99  4.09   TLC % Predicted % 93  96  81   RV % Predicted % 153  133  136     No results found for: "NITRICOXIDE"      Assessment & Plan:   Acute bronchitis with COPD (HCC) Unresolved bronchitic illness/AECOPD.  She has already been started on empiric doxycycline.  Previous CXR without superimposed infection.  Will treat her with Depo 80 mg injection x 1 and lower dose prednisone taper given difficulties with blood sugar.  Advised her to monitor these closely.  Initiate cough control and mucociliary clearance therapies.  Action plan in place.  Strict  return/ED precautions. She is currently not on any maintenance therapy, adding to difficulties controlling her COPD/asthma.  Instructed on the importance of remaining on maintenance therapies.  She has difficulties tolerating inhalers.  Her PCP did try her on Breztri in May but she felt like it irritated the back of her throat.  Never tried this with a spacer.  She had been on  nebulized medications in the past but does not remember exactly when she ran out of these.  Would be willing to restart if affordable.  Order sent to [facility] to restart budesonide, Yupelri and Brovana nebs.  In the interim, provided with samples of Breztri and order for spacer sent to the pharmacy.  Teach back performed.  Aware of side effect profile.  Will let us know if nebulized medications are not affordable or if she would rather continue on Breztri.  Understands to stop Breztri when she receives nebulized meds.  Patient Instructions  Continue Albuterol inhaler 2 puffs or 3 mL neb every 6 hours as needed for shortness of breath or wheezing. Notify if symptoms persist despite rescue inhaler/neb use. Use nebs at least three times a day until symptoms improve. Follow with flutter valve 10 times Continue flonase nasal spray 2 sprays each nostril daily Continue supplemental oxygen 2 lpm for goal >88-90% Continue doxycycline as previously prescribed  Restart Breztri 2 puffs Twice daily. Brush tongue and rinse mouth afterwards. Use with spacer.  We will work on getting your nebulized medications back. These will be sent through [facility]. Once you receive them, you will stop the [facility] then start.... Budesonide 2 mL neb Twice daily. Brush tongue and rinse mouth afterwards  Brovana 2 mL neb neb Twice daily Yupelri 3 mL daily  -Prednisone 20 mg for 5 days then 10 mg for 5 days. Take in AM with food. Monitor your blood sugars carefully. If you are staying >300 despite insulin use, you need to go to the emergency department  -Use  benzonatate 1 capsule Three times a day for cough -Hycodcan cough syrup 5 mL every 8 hours as needed for cough. May cause drowsiness. Do not drive after taking. Do not take within 4 hours of your tramadol  -Guaifenesin (mucinex) 600 mg Twice daily for cough/congestion  Orders sent for new CPAP    Follow up in 2 weeks with any doctor in new pt slot or Katie Tammi Boulier,NP. If symptoms do not improve or worsen, please contact office for sooner follow up or seek emergency care.    Chronic hypoxic respiratory failure (HCC) Stable without increased O2 requirement.  Goal greater than 80 to 90%.  OSA on CPAP Difficulties wearing CPAP over the last few weeks due to hospitalization, bronchitic illness and now with broken mask.  Order sent for new supplies.  She also needs a new CPAP machine-order placed today.  Advised her to resume therapy when able.  Reviewed risks of untreated sleep apnea.  Will reassess at follow-up.   I spent 45 minutes of dedicated to the care of this patient on the date of this encounter to include pre-visit review of records, face-to-face time with the patient discussing conditions above, post visit ordering of testing, clinical documentation with the electronic health record, making appropriate referrals as documented, and communicating necessary findings to members of the patients care team.  Noemi Chapel, NP 12/10/2022  Pt aware and understands NP's role.

## 2022-12-10 NOTE — Assessment & Plan Note (Signed)
Difficulties wearing CPAP over the last few weeks due to hospitalization, bronchitic illness and now with broken mask.  Order sent for new supplies.  She also needs a new CPAP machine-order placed today.  Advised her to resume therapy when able.  Reviewed risks of untreated sleep apnea.  Will reassess at follow-up.

## 2022-12-11 ENCOUNTER — Encounter (HOSPITAL_COMMUNITY): Payer: Self-pay

## 2022-12-11 ENCOUNTER — Telehealth: Payer: Self-pay | Admitting: Family Medicine

## 2022-12-11 ENCOUNTER — Emergency Department (HOSPITAL_COMMUNITY): Payer: Medicare HMO

## 2022-12-11 ENCOUNTER — Other Ambulatory Visit: Payer: Self-pay | Admitting: Podiatry

## 2022-12-11 ENCOUNTER — Emergency Department (HOSPITAL_COMMUNITY)
Admission: EM | Admit: 2022-12-11 | Discharge: 2022-12-11 | Disposition: A | Discharge status: Home / Self Care | Payer: Medicare HMO | Attending: Emergency Medicine | Admitting: Emergency Medicine

## 2022-12-11 ENCOUNTER — Other Ambulatory Visit: Payer: Self-pay

## 2022-12-11 ENCOUNTER — Encounter: Payer: Self-pay | Admitting: Nurse Practitioner

## 2022-12-11 DIAGNOSIS — J449 Chronic obstructive pulmonary disease, unspecified: Secondary | ICD-10-CM | POA: Diagnosis not present

## 2022-12-11 DIAGNOSIS — R0602 Shortness of breath: Secondary | ICD-10-CM | POA: Diagnosis present

## 2022-12-11 DIAGNOSIS — Z794 Long term (current) use of insulin: Secondary | ICD-10-CM | POA: Insufficient documentation

## 2022-12-11 DIAGNOSIS — E1122 Type 2 diabetes mellitus with diabetic chronic kidney disease: Secondary | ICD-10-CM | POA: Diagnosis not present

## 2022-12-11 DIAGNOSIS — I130 Hypertensive heart and chronic kidney disease with heart failure and stage 1 through stage 4 chronic kidney disease, or unspecified chronic kidney disease: Secondary | ICD-10-CM | POA: Diagnosis not present

## 2022-12-11 DIAGNOSIS — Z7982 Long term (current) use of aspirin: Secondary | ICD-10-CM | POA: Diagnosis not present

## 2022-12-11 DIAGNOSIS — R062 Wheezing: Secondary | ICD-10-CM | POA: Diagnosis not present

## 2022-12-11 DIAGNOSIS — Z79899 Other long term (current) drug therapy: Secondary | ICD-10-CM | POA: Insufficient documentation

## 2022-12-11 DIAGNOSIS — Z7984 Long term (current) use of oral hypoglycemic drugs: Secondary | ICD-10-CM | POA: Diagnosis not present

## 2022-12-11 DIAGNOSIS — I509 Heart failure, unspecified: Secondary | ICD-10-CM | POA: Diagnosis not present

## 2022-12-11 DIAGNOSIS — Z7951 Long term (current) use of inhaled steroids: Secondary | ICD-10-CM | POA: Insufficient documentation

## 2022-12-11 DIAGNOSIS — Z91010 Allergy to peanuts: Secondary | ICD-10-CM | POA: Diagnosis not present

## 2022-12-11 DIAGNOSIS — R739 Hyperglycemia, unspecified: Secondary | ICD-10-CM

## 2022-12-11 DIAGNOSIS — N182 Chronic kidney disease, stage 2 (mild): Secondary | ICD-10-CM | POA: Insufficient documentation

## 2022-12-11 DIAGNOSIS — R079 Chest pain, unspecified: Secondary | ICD-10-CM | POA: Diagnosis not present

## 2022-12-11 DIAGNOSIS — E1165 Type 2 diabetes mellitus with hyperglycemia: Secondary | ICD-10-CM | POA: Insufficient documentation

## 2022-12-11 DIAGNOSIS — Z1152 Encounter for screening for COVID-19: Secondary | ICD-10-CM | POA: Diagnosis not present

## 2022-12-11 DIAGNOSIS — J441 Chronic obstructive pulmonary disease with (acute) exacerbation: Secondary | ICD-10-CM

## 2022-12-11 LAB — COMPREHENSIVE METABOLIC PANEL
ALT: 52 U/L — ABNORMAL HIGH (ref 0–44)
AST: 25 U/L (ref 15–41)
Albumin: 3.4 g/dL — ABNORMAL LOW (ref 3.5–5.0)
Alkaline Phosphatase: 137 U/L — ABNORMAL HIGH (ref 38–126)
Anion gap: 10 (ref 5–15)
BUN: 20 mg/dL (ref 8–23)
CO2: 29 mmol/L (ref 22–32)
Calcium: 9.8 mg/dL (ref 8.9–10.3)
Chloride: 98 mmol/L (ref 98–111)
Creatinine, Ser: 1.04 mg/dL — ABNORMAL HIGH (ref 0.44–1.00)
GFR, Estimated: 59 mL/min — ABNORMAL LOW (ref >60)
Glucose, Bld: 129 mg/dL — ABNORMAL HIGH (ref 70–99)
Potassium: 3.9 mmol/L (ref 3.5–5.1)
Sodium: 137 mmol/L (ref 135–145)
Total Bilirubin: 0.6 mg/dL (ref 0.3–1.2)
Total Protein: 6.9 g/dL (ref 6.5–8.1)

## 2022-12-11 LAB — CBC
HCT: 42.7 % (ref 36.0–46.0)
Hemoglobin: 14 g/dL (ref 12.0–15.0)
MCH: 30.7 pg (ref 26.0–34.0)
MCHC: 32.8 g/dL (ref 30.0–36.0)
MCV: 93.6 fL (ref 80.0–100.0)
Platelets: 301 10*3/uL (ref 150–400)
RBC: 4.56 MIL/uL (ref 3.87–5.11)
RDW: 13.6 % (ref 11.5–15.5)
WBC: 10.6 10*3/uL — ABNORMAL HIGH (ref 4.0–10.5)
nRBC: 0 % (ref 0.0–0.2)

## 2022-12-11 LAB — RESP PANEL BY RT-PCR (RSV, FLU A&B, COVID)  RVPGX2
Influenza A by PCR: NEGATIVE
Influenza B by PCR: NEGATIVE
Resp Syncytial Virus by PCR: NEGATIVE
SARS Coronavirus 2 by RT PCR: NEGATIVE

## 2022-12-11 MED ORDER — IPRATROPIUM-ALBUTEROL 0.5-2.5 (3) MG/3ML IN SOLN
3.0000 mL | Freq: Once | RESPIRATORY_TRACT | Status: AC
  Administered 2022-12-11: 3 mL via RESPIRATORY_TRACT
  Filled 2022-12-11: qty 3

## 2022-12-11 NOTE — Discharge Instructions (Signed)
You were seen in the emergency room today for hyperglycemia and shortness of breath.    Your hyperglycemia has improved after administering insulin at home. Your labs and imaging have home back reassuring. Since you saw pulmonology yesterday, I would recommend continuing their current treatment for your COPD exacerbation with doxycycline and steroids. You can call them and schedule follow up appointment, but since you just started treatment, I am not going to adjust medications today.   Please call to schedule appointment with primary care for follow-up.  Return to the emergency room if you have any new or worsening symptoms.

## 2022-12-11 NOTE — Telephone Encounter (Signed)
[facility] contacted the office report a missed visit for OT today from this patient. Advised that patient went to ED today so this likely had something to do with it.

## 2022-12-11 NOTE — ED Triage Notes (Addendum)
Pt c/o hyperglycemia. Pt states her glucose was 550 an hour ago. Pt c/o SOBx3wks. Pt wears 2L 02 per [place] at baseline.

## 2022-12-11 NOTE — Telephone Encounter (Signed)
Noted. Thanks.

## 2022-12-11 NOTE — Telephone Encounter (Signed)
FYI: This call has been transferred to Access Nurse. Once the result note has been entered staff can address the message at that time.  Patient called in with the following symptoms:  Red Word: Elevated blood sugar, Patient's current blood sugar is 510, was 551 before she took insulin. HH nurse made the call and states that she has not been taking her insulin as she should be.     Please advise at Mobile [phone] (mobile)  Message is routed to Provider Pool and [facility] Triage

## 2022-12-11 NOTE — ED Notes (Addendum)
PT ambulated to the bathroom with the tech and stas were 95% when she returned to the room, we sat her on the edge of the bed with no O2 and stas were 97%.Jamie (PA) was notified.

## 2022-12-11 NOTE — ED Provider Notes (Signed)
EMERGENCY DEPARTMENT AT [facility] Provider Note   CSN: [id number] Arrival date & time: 12/11/22  1311     History  Chief Complaint  Patient presents with   Shortness of Breath   Hyperglycemia    Kathleen Hatfield is a 67 y.o. female with past medical history of diabetes, GERD, Diabetes, hypertension patient's first and second CKD, hyperlipidemia, hypertension, congestive heart failure, chronic respiratory failure, COPD and asthma presenting to the emergency room with hyperglycemia.  Patient's blood sugar was 550 this morning when she woke up after eating an egg.  Per follow-up phone note in chart, primary care had recommended coming to the emergency room because patient sounded lethargic on the phone.  Patient has not missed any doses of insulin.  Prior to coming to emergency room patient took her insulin as prescribed and blood sugar returned to normal.  Patient reports that she has also been having cough, shortness of breath which is baseline for her, but worsened the past 2 days.  Patient follows with pulmonology and was seen in office yesterday and started on doxycycline and steroids. Patient reports no change in symptoms since she was seen yesterday with pulmonology. Denies any chest pain, chest tightness, palpitations, generalized weakness abdominal pain nausea vomit diarrhea.   Shortness of Breath Hyperglycemia Associated symptoms: shortness of breath        Home Medications Prior to Admission medications   Medication Sig Start Date End Date Taking? Authorizing Provider  acetaminophen (TYLENOL) 500 MG tablet Take 500 mg by mouth every 6 (six) hours as needed for moderate pain.    [provider]  albuterol (VENTOLIN HFA) 108 (90 Base) MCG/ACT inhaler Inhale 2 puffs into the lungs See admin instructions. Inhale 2 puffs by mouth two to three times a day as needed for shortness of breath or wheezing 12/08/22   Joaquim Nam, MD  Alirocumab (PRALUENT)  150 MG/ML SOAJ Inject 1 mL (150 mg total) into the skin every 14 (fourteen) days. 12/08/22   Joaquim Nam, MD  arformoterol (BROVANA) 15 MCG/2ML NEBU Take 2 mLs (15 mcg total) by nebulization 2 (two) times daily. 12/10/22   Cobb, Ruby Cola, NP  aspirin EC 81 MG tablet Take 1 tablet (81 mg total) by mouth daily. Swallow whole. 06/04/21   Tereso Newcomer T, PA-C  BAYER CONTOUR NEXT TEST test strip 1 each by Other route daily. As directed 07/30/15   [provider]  Budeson-Glycopyrrol-Formoterol (BREZTRI AEROSPHERE) 160-9-4.8 MCG/ACT AERO Inhale 2 puffs into the lungs in the morning and at bedtime. 12/10/22   Cobb, Ruby Cola, NP  budesonide (PULMICORT) 0.5 MG/2ML nebulizer solution Take 2 mLs (0.5 mg total) by nebulization 2 (two) times daily. 12/10/22   Cobb, Ruby Cola, NP  buPROPion (WELLBUTRIN XL) 300 MG 24 hr tablet Take 1 tablet (300 mg total) by mouth every morning. 12/08/22   Joaquim Nam, MD  Cholecalciferol (VITAMIN D-3) 5000 UNITS TABS Take 5,000 Units by mouth every other day.     [provider]  colchicine 0.6 MG tablet Take 1 tablet (0.6 mg total) by mouth daily as needed (for gout). 12/08/22   Joaquim Nam, MD  Continuous Blood Gluc Receiver (DEXCOM G7 RECEIVER) DEVI by Does not apply route.    [provider]  Continuous Blood Gluc Sensor (DEXCOM G7 SENSOR) MISC 1 Device by Does not apply route as directed. Apply sensor every 10 days 03/31/22   Shamleffer, Konrad Dolores, MD  dicyclomine (BENTYL) 20  MG tablet TAKE 1 TABLET UP TO FOUR TIMES A DAY FOR GI CRAMPING, PAIN, NAUSEA, AND VOMITING AS NEEDED 12/08/22   Joaquim Nam, MD  doxycycline (VIBRA-TABS) 100 MG tablet Take 1 tablet (100 mg total) by mouth 2 (two) times daily. 12/09/22   Joaquim Nam, MD  DULoxetine (CYMBALTA) 60 MG capsule Take 1 capsule (60 mg total) by mouth daily. 12/08/22   Joaquim Nam, MD  estradiol (ESTRACE) 0.1 MG/GM vaginal cream Place 1 Applicatorful vaginally 3 (three)  times a week.    [provider]  ferrous sulfate (FEROSUL) 325 (65 FE) MG tablet Take 1 tablet (325 mg total) by mouth every other day. Dispense as a capsule if possible. 12/08/22   Joaquim Nam, MD  fluticasone (FLONASE) 50 MCG/ACT nasal spray Place 2 sprays into both nostrils as needed. 12/08/22   Joaquim Nam, MD  furosemide (LASIX) 20 MG tablet Take 3 tablets (60 mg total) by mouth 2 (two) times daily. 05/01/22   Tereso Newcomer T, PA-C  gabapentin (NEURONTIN) 300 MG capsule Take 1 capsule (300 mg total) by mouth 2 (two) times daily as needed. 12/08/22   Joaquim Nam, MD  HYDROcodone bit-homatropine (HYCODAN) 5-1.5 MG/5ML syrup Take 5 mLs by mouth every 8 (eight) hours as needed for cough. 12/10/22   Cobb, Ruby Cola, NP  insulin aspart (NOVOLOG FLEXPEN) 100 UNIT/ML FlexPen Inject 1-8 Units into the skin See admin instructions. Per sliding scale.  Max daily 45 units 12/08/22   Joaquim Nam, MD  insulin degludec (TRESIBA FLEXTOUCH) 200 UNIT/ML FlexTouch Pen Inject 80 Units into the skin daily. 12/08/22   Joaquim Nam, MD  Insulin Pen Needle (GLOBAL EASE INJECT PEN NEEDLES) 32G X 4 MM MISC 1 Device by Other route in the morning, at noon, in the evening, and at bedtime. 03/31/22   Shamleffer, Konrad Dolores, MD  ipratropium-albuterol (DUONEB) 0.5-2.5 (3) MG/3ML SOLN Take 3 mLs by nebulization every 6 (six) hours as needed (wheezing). 02/10/21   Coralyn Helling, MD  ketoconazole (NIZORAL) 2 % cream Apply 1 Application topically as needed for irritation. 12/08/22   Joaquim Nam, MD  meloxicam (MOBIC) 15 MG tablet Take 1 tablet (15 mg total) by mouth daily as needed for pain. 12/08/22 02/06/23  Joaquim Nam, MD  mesalamine (LIALDA) 1.2 g EC tablet Take 2.4 g by mouth in the morning and at bedtime.    [provider]  metolazone (ZAROXOLYN) 5 MG tablet TAKE 1 TABLET BY MOUTH EVERY DAY AS NEEDED 12/01/21   Joaquim Nam, MD  MICROLET LANCETS MISC 1 each by Other route. As  directed 07/30/15   [provider]  nitrofurantoin (MACRODANTIN) 50 MG capsule Take 50 mg by mouth at bedtime. 10/08/21   [provider]  nystatin (MYCOSTATIN/NYSTOP) powder Apply 1 application topically 3 (three) times daily. Patient taking differently: Apply 1 application  topically as needed. 09/26/20   Joaquim Nam, MD  ondansetron (ZOFRAN) 4 MG tablet TAKE ONE TABLET BY MOUTH every EIGHT hours AS NEEDED FOR NAUSEA AND VOMITING 05/22/22   Joaquim Nam, MD  OXYGEN Inhale 2 L into the lungs continuous.    [provider]  pantoprazole (PROTONIX) 40 MG tablet TAKE ONE TABLET BY MOUTH EVERY MORNING 05/25/22   Joaquim Nam, MD  polyethylene glycol [facility] / Ethelene Hal) packet Take 17 g by mouth daily as needed for mild constipation.    [provider]  potassium chloride (MICRO-K) 10 MEQ CR  capsule Take 2 capsules by mouth in the morning, 1 capsule at noon, then 2 capsules in the evening 12/08/22   Joaquim Nam, MD  predniSONE (DELTASONE) 10 MG tablet 2 tablets for 5 days then 1 tablet for 5 days then stop 12/10/22   Cobb, Ruby Cola, NP  revefenacin (YUPELRI) 175 MCG/3ML nebulizer solution Take 3 mLs (175 mcg total) by nebulization daily. 12/10/22   Cobb, Ruby Cola, NP  rOPINIRole (REQUIP) 4 MG tablet TAKE 1 TABLET BY MOUTH EVERYDAY AT BEDTIME 12/08/22   Joaquim Nam, MD  Semaglutide, 2 MG/DOSE, 8 MG/3ML SOPN Inject 2 mg as directed once a week. Patient taking differently: Inject 2 mg as directed once a week. Saturday 07/29/22   Joaquim Nam, MD  simethicone (GAS-X) 80 MG chewable tablet Chew 1 tablet (80 mg total) by mouth every 6 (six) hours as needed for flatulence. 11/28/21   Joaquim Nam, MD  Spacer/Aero-Holding Rudean Curt Use with inhaler 12/10/22   Cobb, Ruby Cola, NP  spironolactone (ALDACTONE) 25 MG tablet Take 0.5 tablets (12.5 mg total) by mouth daily. 05/26/22   Joaquim Nam, MD  traMADol (ULTRAM) 50 MG tablet Take 1 tablet  (50 mg total) by mouth 3 (three) times daily as needed. 11/04/22   Joaquim Nam, MD  UNABLE TO FIND CPAP- At bedtime    [provider]      Allergies    Almond oil, Morphine and codeine, Primidone, Atorvastatin, Ceclor [cefaclor], Sulfa antibiotics, Ciprofloxacin, Levaquin [levofloxacin], Losartan, Peanut-containing drug products, Repatha [evolocumab], and Statins    Review of Systems   Review of Systems  Respiratory:  Positive for shortness of breath.     Physical Exam Updated Vital Signs BP 120/75   Pulse 84   Temp 98.3 F (36.8 C) (Oral)   Resp 17   Ht 5\' 5"  (1.651 m)   Wt 118 kg   SpO2 100%   BMI 43.29 kg/m  Physical Exam Vitals and nursing note reviewed.  Constitutional:      General: She is not in acute distress.    Appearance: She is not toxic-appearing.  HENT:     Head: Normocephalic and atraumatic.  Eyes:     General: No scleral icterus.    Conjunctiva/sclera: Conjunctivae normal.  Cardiovascular:     Rate and Rhythm: Normal rate and regular rhythm.     Pulses: Normal pulses.     Heart sounds: Normal heart sounds.  Pulmonary:     Effort: Pulmonary effort is normal. No respiratory distress.     Breath sounds: No stridor. Wheezing present. No rhonchi or rales.     Comments: Mild wheezing.  No increased work of breathing, no tripoding no increased respiratory rate. Chest:     Chest wall: No tenderness.  Abdominal:     General: Abdomen is flat. Bowel sounds are normal. There is no distension.     Palpations: Abdomen is soft.     Tenderness: There is no abdominal tenderness.  Musculoskeletal:     Right lower leg: No edema.     Left lower leg: No edema.  Skin:    General: Skin is warm and dry.     Capillary Refill: Capillary refill takes less than 2 seconds.     Findings: No lesion.  Neurological:     General: No focal deficit present.     Mental Status: She is alert and oriented to person, place, and time. Mental status is at baseline.  Cranial Nerves: No cranial nerve deficit.     Motor: No weakness.     ED Results / Procedures / Treatments   Labs (all labs ordered are listed, but only abnormal results are displayed) Labs Reviewed  COMPREHENSIVE METABOLIC PANEL - Abnormal; Notable for the following components:      Result Value   Glucose, Bld 129 (*)    Creatinine, Ser 1.04 (*)    Albumin 3.4 (*)    ALT 52 (*)    Alkaline Phosphatase 137 (*)    GFR, Estimated 59 (*)    All other components within normal limits  CBC - Abnormal; Notable for the following components:   WBC 10.6 (*)    All other components within normal limits  RESP PANEL BY RT-PCR (RSV, FLU A&B, COVID)  [id number]    EKG EKG Interpretation Date/Time:  Friday December 11 2022 13:21:14 EDT Ventricular Rate:  90 PR Interval:  162 QRS Duration:  128 QT Interval:  400 QTC Calculation: 489 R Axis:   269  Text Interpretation: Normal sinus rhythm Right bundle branch block Abnormal ECG When compared with ECG of 23-Nov-2022 15:54, PREVIOUS ECG IS PRESENT No significant change since last tracing Confirmed by Jacalyn Lefevre [phone]) on 12/11/2022 6:15:22 PM  Radiology DG Chest 1 View  Result Date: 12/11/2022 CLINICAL DATA:  Chest pain EXAM: CHEST  1 VIEW COMPARISON:  Chest radiograph dated 11/23/2022 FINDINGS: Normal lung volumes. No focal consolidations. No pleural effusion or pneumothorax. Similar mildly enlarged cardiomediastinal silhouette. No acute osseous abnormality. IMPRESSION: No active disease. Electronically Signed   By: Agustin Cree M.D.   On: 12/11/2022 15:12    Procedures Procedures    Medications Ordered in ED Medications - No data to display  ED Course/ Medical Decision Making/ A&P                                 Medical Decision Making Amount and/or Complexity of Data Reviewed Labs: ordered.  Risk Prescription drug management.   EZMA REHM 67 y.o. presented today for shortness of breath and hyperglycemia.  Working DDx that I  considered at this time includes, but not limited to, asthma/COPD exacerbation, URI, viral illness, anemia, ACS, PE, pneumonia, pleural effusion, lung mass, medication non-compliance, steroid s/e, diet, DM  R/o DDx: These are considered less likely due to history of present illness, physical exam, labs/imaging findings  Pmhx: Medic respiratory failure, COPD, asthma, diabetes, hypertension, hyperlipidemia  Review of prior external notes: 12/11/2022 telephone visit, 12/10/2022 pulmonology visit yesterday.  Unique Tests and My Interpretation:  CBC: WBC 10.6, no anemia BMP: Unremarkable, glucose 129, normal electrolytes, no anion gap EKG: RBBB  CXR: No active disease, no areas of consolidation  Respiratory Panel: NEG  Problem List / ED Course / Critical interventions / Medication management  Patient reporting to emergency room today for hyperglycemia.  Labs show hyperglycemia has resolved.  Patient said blood sugar went down after giving herself insulin.  The patient also reports she has had approximately 3 weeks of shortness of breath, cough.  Her pulmonologist are yesterday and treating her for COPD exacerbation with steroids and doxycycline.  Patient given DuoNeb here which improved symptoms.  Will ambulate patient with pulse ox to make sure she is not desaturating.  Otherwise her labs and vitals have been stable while here, no increased respiratory rate and no increased oxygen needed. Patient able to ambulate with pulse ox, oxygen saturation did  not drop upon walking. Reevaluation of the patient after these medicines showed that the patient improved Patients vitals assessed. Upon arrival patient is hemodynamically stable.  I have reviewed the patients home medicines and have made adjustments as needed  Plan: Continue taking antibiotic as prescribed. F/u w/ PCP in 2-3d to ensure resolution of sx.  Patient was given return precautions. Patient stable for discharge at this time.  Patient  educated on sx/dx and verbalized understanding of plan. Will return to ER for new or worsening sx.          Final Clinical Impression(s) / ED Diagnoses Final diagnoses:  None    Rx / DC Orders ED Discharge Orders     None         Raford Pitcher Evalee Jefferson 12/11/22 2022    Jacalyn Lefevre, MD 12/11/22 2327

## 2022-12-11 NOTE — Telephone Encounter (Signed)
I spoke with April RN with [facility] 12/11/22 pt ate an egg then BS 551; pt took Novolog 45 U and Tresiba 50 U about 10 mins ago and BS is 510 but April said pt is sleepy and lethargic. Pt has not been taking her insulin as she should and skipping doses. Advised pt should go to ED for eval and April wanted to know if pt should increase insulin dosages. I spoke with Dr Para March and he said if pt was lethargic would need to go to ED. April said pt agrees to go to ED. Sending note to Dr Para March.

## 2022-12-13 DIAGNOSIS — J9621 Acute and chronic respiratory failure with hypoxia: Secondary | ICD-10-CM | POA: Diagnosis not present

## 2022-12-13 DIAGNOSIS — I130 Hypertensive heart and chronic kidney disease with heart failure and stage 1 through stage 4 chronic kidney disease, or unspecified chronic kidney disease: Secondary | ICD-10-CM | POA: Diagnosis not present

## 2022-12-13 DIAGNOSIS — E1122 Type 2 diabetes mellitus with diabetic chronic kidney disease: Secondary | ICD-10-CM | POA: Diagnosis not present

## 2022-12-13 DIAGNOSIS — I2510 Atherosclerotic heart disease of native coronary artery without angina pectoris: Secondary | ICD-10-CM | POA: Diagnosis not present

## 2022-12-13 DIAGNOSIS — G8929 Other chronic pain: Secondary | ICD-10-CM | POA: Diagnosis not present

## 2022-12-13 DIAGNOSIS — J441 Chronic obstructive pulmonary disease with (acute) exacerbation: Secondary | ICD-10-CM | POA: Diagnosis not present

## 2022-12-13 DIAGNOSIS — I4510 Unspecified right bundle-branch block: Secondary | ICD-10-CM | POA: Diagnosis not present

## 2022-12-13 DIAGNOSIS — K5190 Ulcerative colitis, unspecified, without complications: Secondary | ICD-10-CM | POA: Diagnosis not present

## 2022-12-13 DIAGNOSIS — F39 Unspecified mood [affective] disorder: Secondary | ICD-10-CM | POA: Diagnosis not present

## 2022-12-13 DIAGNOSIS — G2581 Restless legs syndrome: Secondary | ICD-10-CM | POA: Diagnosis not present

## 2022-12-13 DIAGNOSIS — N183 Chronic kidney disease, stage 3 unspecified: Secondary | ICD-10-CM | POA: Diagnosis not present

## 2022-12-13 DIAGNOSIS — I5030 Unspecified diastolic (congestive) heart failure: Secondary | ICD-10-CM | POA: Diagnosis not present

## 2022-12-13 NOTE — Telephone Encounter (Signed)
Noted. Thanks.

## 2022-12-20 DIAGNOSIS — J449 Chronic obstructive pulmonary disease, unspecified: Secondary | ICD-10-CM | POA: Diagnosis not present

## 2022-12-22 ENCOUNTER — Ambulatory Visit (HOSPITAL_COMMUNITY): Payer: Medicare HMO | Attending: Cardiovascular Disease

## 2022-12-22 DIAGNOSIS — N1831 Chronic kidney disease, stage 3a: Secondary | ICD-10-CM | POA: Diagnosis not present

## 2022-12-22 DIAGNOSIS — I10 Essential (primary) hypertension: Secondary | ICD-10-CM | POA: Diagnosis not present

## 2022-12-22 DIAGNOSIS — I5032 Chronic diastolic (congestive) heart failure: Secondary | ICD-10-CM | POA: Insufficient documentation

## 2022-12-22 DIAGNOSIS — E1165 Type 2 diabetes mellitus with hyperglycemia: Secondary | ICD-10-CM | POA: Insufficient documentation

## 2022-12-22 DIAGNOSIS — I700 Atherosclerosis of aorta: Secondary | ICD-10-CM | POA: Insufficient documentation

## 2022-12-22 LAB — ECHOCARDIOGRAM COMPLETE
Area-P 1/2: 3.6 cm2
S' Lateral: 3.2 cm

## 2022-12-24 ENCOUNTER — Other Ambulatory Visit (HOSPITAL_COMMUNITY): Payer: Self-pay

## 2022-12-25 ENCOUNTER — Other Ambulatory Visit (HOSPITAL_COMMUNITY): Payer: Self-pay

## 2022-12-25 MED ORDER — TRAMADOL HCL 50 MG PO TABS
50.0000 mg | ORAL_TABLET | Freq: Three times a day (TID) | ORAL | 1 refills | Status: AC | PRN
Start: 2022-11-04 — End: ?
  Filled 2023-03-11: qty 30, 10d supply, fill #0

## 2022-12-25 MED ORDER — KETOCONAZOLE 2 % EX CREA
1.0000 | TOPICAL_CREAM | Freq: Two times a day (BID) | CUTANEOUS | 1 refills | Status: DC
  Filled 2023-02-24: qty 30, 15d supply, fill #0

## 2022-12-25 MED ORDER — GABAPENTIN 300 MG PO CAPS
300.0000 mg | ORAL_CAPSULE | Freq: Two times a day (BID) | ORAL | 1 refills | Status: DC | PRN
  Filled 2022-12-28: qty 180, 90d supply, fill #0

## 2022-12-25 MED ORDER — PRALUENT 150 MG/ML ~~LOC~~ SOAJ
150.0000 mg | SUBCUTANEOUS | 1 refills | Status: DC
Start: 2022-12-08 — End: 2023-10-03
  Filled 2023-02-02: qty 6, 84d supply, fill #0
  Filled 2023-03-02: qty 2, 28d supply, fill #0
  Filled 2023-03-15 – 2023-05-24 (×5): qty 2, 28d supply, fill #1

## 2022-12-25 MED ORDER — OZEMPIC (1 MG/DOSE) 4 MG/3ML ~~LOC~~ SOPN: 1.0000 mg | PEN_INJECTOR | SUBCUTANEOUS | 6 refills | Status: DC

## 2022-12-25 MED ORDER — ALBUTEROL SULFATE HFA 108 (90 BASE) MCG/ACT IN AERS
2.0000 | INHALATION_SPRAY | Freq: Three times a day (TID) | RESPIRATORY_TRACT | 3 refills | Status: DC | PRN
Start: 2022-12-08 — End: 2023-09-30
  Filled 2023-05-24: qty 6.7, 25d supply, fill #0

## 2022-12-25 MED ORDER — OPTICHAMBER DIAMOND MISC
1 refills | Status: AC
  Filled 2023-02-02: qty 1, 30d supply, fill #0

## 2022-12-25 MED ORDER — DULOXETINE HCL 60 MG PO CPEP
60.0000 mg | ORAL_CAPSULE | Freq: Every day | ORAL | 3 refills | Status: DC
  Filled 2022-12-28 – 2022-12-29 (×2): qty 90, 90d supply, fill #0
  Filled 2022-12-30: qty 30, 30d supply, fill #0
  Filled 2023-02-24 (×2): qty 90, 90d supply, fill #0
  Filled 2023-02-25 – 2023-03-04 (×3): qty 30, 30d supply, fill #0

## 2022-12-25 MED ORDER — REPATHA SURECLICK 140 MG/ML ~~LOC~~ SOAJ: 140.0000 mg | SUBCUTANEOUS | 11 refills | Status: DC

## 2022-12-25 MED ORDER — COLCHICINE 0.6 MG PO TABS
0.6000 mg | ORAL_TABLET | Freq: Every day | ORAL | 1 refills | Status: DC | PRN
  Filled 2023-04-30: qty 30, 30d supply, fill #0

## 2022-12-25 MED ORDER — DICYCLOMINE HCL 20 MG PO TABS
20.0000 mg | ORAL_TABLET | Freq: Four times a day (QID) | ORAL | 1 refills | Status: AC | PRN
  Filled 2023-02-24 – 2023-03-15 (×4): qty 60, 15d supply, fill #0

## 2022-12-25 MED ORDER — FERROUS SULFATE 325 (65 FE) MG PO TABS
325.0000 mg | ORAL_TABLET | ORAL | 3 refills | Status: DC
  Filled 2022-12-28 – 2023-02-24 (×2): qty 45, 90d supply, fill #0
  Filled 2023-03-01: qty 15, 30d supply, fill #0
  Filled 2023-03-01: qty 45, 90d supply, fill #0
  Filled 2023-03-02: qty 15, 30d supply, fill #0
  Filled 2023-03-15 – 2023-03-30 (×3): qty 15, 30d supply, fill #1

## 2022-12-25 MED ORDER — BENZONATATE 200 MG PO CAPS
200.0000 mg | ORAL_CAPSULE | Freq: Three times a day (TID) | ORAL | 1 refills | Status: DC | PRN
Start: 2022-03-06 — End: 2023-05-06
  Filled 2023-02-02: qty 30, 10d supply, fill #0

## 2022-12-25 MED ORDER — POTASSIUM CHLORIDE ER 10 MEQ PO CPCR
ORAL_CAPSULE | ORAL | 1 refills | Status: DC
  Filled 2022-12-28: qty 450, 90d supply, fill #0
  Filled 2022-12-30: qty 150, 30d supply, fill #0
  Filled 2023-02-24: qty 450, 90d supply, fill #0
  Filled 2023-03-01: qty 150, 30d supply, fill #0
  Filled 2023-03-15 – 2023-03-30 (×2): qty 150, 30d supply, fill #1
  Filled 2023-04-30: qty 150, 30d supply, fill #2

## 2022-12-25 MED ORDER — ROPINIROLE HCL 4 MG PO TABS
4.0000 mg | ORAL_TABLET | Freq: Every day | ORAL | 3 refills | Status: DC
  Filled 2022-12-30: qty 30, 30d supply, fill #0
  Filled 2023-02-02 – 2023-02-24 (×2): qty 90, 90d supply, fill #0
  Filled 2023-02-25 – 2023-03-04 (×3): qty 30, 30d supply, fill #0
  Filled 2023-03-15 – 2023-03-30 (×2): qty 30, 30d supply, fill #1
  Filled 2023-04-30: qty 30, 30d supply, fill #2
  Filled 2023-05-24: qty 30, 30d supply, fill #3

## 2022-12-25 MED ORDER — BUPROPION HCL ER (XL) 300 MG PO TB24
300.0000 mg | ORAL_TABLET | Freq: Every morning | ORAL | 2 refills | Status: DC
  Filled 2022-12-30: qty 30, 30d supply, fill #0
  Filled 2022-12-30 – 2023-02-02 (×2): qty 90, 90d supply, fill #0
  Filled 2023-02-24 – 2023-04-12 (×4): qty 90, 90d supply, fill #1

## 2022-12-28 ENCOUNTER — Other Ambulatory Visit: Payer: Self-pay | Admitting: Family Medicine

## 2022-12-28 ENCOUNTER — Other Ambulatory Visit (HOSPITAL_COMMUNITY): Payer: Self-pay

## 2022-12-28 ENCOUNTER — Other Ambulatory Visit: Payer: Self-pay | Admitting: Physician Assistant

## 2022-12-29 ENCOUNTER — Other Ambulatory Visit: Payer: Self-pay

## 2022-12-29 ENCOUNTER — Other Ambulatory Visit: Payer: Self-pay | Admitting: Family Medicine

## 2022-12-29 ENCOUNTER — Other Ambulatory Visit: Payer: Self-pay | Admitting: Internal Medicine

## 2022-12-29 ENCOUNTER — Telehealth: Payer: Self-pay | Admitting: Family Medicine

## 2022-12-29 ENCOUNTER — Other Ambulatory Visit (HOSPITAL_COMMUNITY): Payer: Self-pay

## 2022-12-29 DIAGNOSIS — R110 Nausea: Secondary | ICD-10-CM

## 2022-12-29 MED ORDER — GABAPENTIN 300 MG PO CAPS
300.0000 mg | ORAL_CAPSULE | Freq: Two times a day (BID) | ORAL | 1 refills | Status: DC
  Filled 2022-12-29: qty 180, 90d supply, fill #0
  Filled 2022-12-30: qty 60, 30d supply, fill #0
  Filled 2023-02-23 – 2023-02-24 (×2): qty 180, 90d supply, fill #0
  Filled 2023-02-25 – 2023-03-04 (×3): qty 60, 30d supply, fill #0
  Filled 2023-03-15 – 2023-03-30 (×2): qty 60, 30d supply, fill #1
  Filled 2023-05-24: qty 60, 30d supply, fill #2

## 2022-12-29 MED ORDER — ONDANSETRON HCL 4 MG PO TABS
4.0000 mg | ORAL_TABLET | Freq: Three times a day (TID) | ORAL | 3 refills | Status: AC | PRN
  Filled 2022-12-29: qty 20, 7d supply, fill #0
  Filled 2023-02-02: qty 20, 7d supply, fill #1
  Filled 2023-03-11: qty 20, 7d supply, fill #2
  Filled 2023-03-15 – 2023-04-30 (×2): qty 20, 7d supply, fill #3

## 2022-12-29 MED ORDER — FUROSEMIDE 20 MG PO TABS
60.0000 mg | ORAL_TABLET | Freq: Two times a day (BID) | ORAL | 2 refills | Status: DC
Start: 2022-12-29 — End: 2023-12-13
  Filled 2022-12-29: qty 540, 90d supply, fill #0
  Filled 2022-12-30: qty 450, 75d supply, fill #0
  Filled 2022-12-30: qty 180, 30d supply, fill #0
  Filled 2023-02-24: qty 450, 75d supply, fill #1
  Filled 2023-02-25: qty 180, 30d supply, fill #1
  Filled 2023-02-25: qty 450, 75d supply, fill #1
  Filled 2023-03-04: qty 180, 30d supply, fill #1
  Filled 2023-03-15 – 2023-03-30 (×2): qty 180, 30d supply, fill #2
  Filled 2023-05-24: qty 180, 30d supply, fill #3

## 2022-12-29 NOTE — Telephone Encounter (Signed)
LAST APPOINTMENT DATE: 12/28/2022   NEXT APPOINTMENT DATE: Visit date not found    LAST REFILL: 06/12/22  QTY: #20 1 rf

## 2022-12-29 NOTE — Telephone Encounter (Signed)
-----   Message from [facility] sent at 12/29/2022  9:01 AM EDT ----- Regarding: Rx Request Good morning!  This patient has transferred her prescriptions to [facility] to utilize our adherence packaging program. She stated that her gabapentin is written as PRN, but she takes it regularly in the morning and at bedtime. In order to add this to her pack, we need a new rx stating that it's taken BID only, not PRN. If this change is ok with you, could you please send [place] an updated prescription?  Thank you!

## 2022-12-29 NOTE — Telephone Encounter (Signed)
Sent. Thanks.   

## 2022-12-30 ENCOUNTER — Other Ambulatory Visit: Payer: Self-pay

## 2022-12-30 ENCOUNTER — Other Ambulatory Visit (HOSPITAL_COMMUNITY): Payer: Self-pay

## 2022-12-30 MED ORDER — SPIRONOLACTONE 25 MG PO TABS
12.5000 mg | ORAL_TABLET | Freq: Every day | ORAL | 1 refills | Status: DC
  Filled 2022-12-30: qty 45, 90d supply, fill #0
  Filled 2023-03-01: qty 15, 30d supply, fill #1
  Filled 2023-03-15 – 2023-03-30 (×2): qty 45, 90d supply, fill #1

## 2023-01-05 ENCOUNTER — Encounter: Payer: Self-pay | Admitting: Family Medicine

## 2023-01-05 ENCOUNTER — Other Ambulatory Visit (HOSPITAL_COMMUNITY): Payer: Self-pay

## 2023-01-05 ENCOUNTER — Other Ambulatory Visit: Payer: Self-pay | Admitting: Family Medicine

## 2023-01-05 MED ORDER — PANTOPRAZOLE SODIUM 40 MG PO TBEC
40.0000 mg | DELAYED_RELEASE_TABLET | Freq: Every morning | ORAL | 1 refills | Status: DC
  Filled 2023-01-05: qty 75, 75d supply, fill #0
  Filled 2023-02-24 – 2023-03-15 (×3): qty 75, 75d supply, fill #1
  Filled 2023-03-30: qty 30, 30d supply, fill #1
  Filled 2023-04-30: qty 30, 30d supply, fill #2

## 2023-01-06 ENCOUNTER — Other Ambulatory Visit (HOSPITAL_COMMUNITY): Payer: Self-pay

## 2023-01-06 NOTE — Telephone Encounter (Signed)
This prev came through outside clinic.  Please have it routed to the managing MD.  Thanks.

## 2023-01-06 NOTE — Telephone Encounter (Signed)
Patient has been prescribed medication in the past from historical provider. Do you want to refill?

## 2023-01-06 NOTE — Telephone Encounter (Signed)
Pt rtn call. States this refill request is not for [place] and to disregard.

## 2023-01-06 NOTE — Telephone Encounter (Addendum)
Need to find out if Dr Para March prescribes mesalamine or GI.   Left message with pt's significant other, [name] (not on dpr), asking him to have pt to call back.

## 2023-01-07 ENCOUNTER — Other Ambulatory Visit (HOSPITAL_COMMUNITY): Payer: Self-pay

## 2023-01-07 MED ORDER — MESALAMINE 1.2 G PO TBEC
2.4000 g | DELAYED_RELEASE_TABLET | Freq: Two times a day (BID) | ORAL | 0 refills | Status: DC
  Filled 2023-02-02: qty 120, 30d supply, fill #0

## 2023-01-09 ENCOUNTER — Encounter (HOSPITAL_COMMUNITY): Payer: Self-pay

## 2023-01-11 ENCOUNTER — Other Ambulatory Visit (HOSPITAL_COMMUNITY): Payer: Self-pay

## 2023-01-20 DIAGNOSIS — G4733 Obstructive sleep apnea (adult) (pediatric): Secondary | ICD-10-CM | POA: Diagnosis not present

## 2023-01-20 DIAGNOSIS — I10 Essential (primary) hypertension: Secondary | ICD-10-CM | POA: Diagnosis not present

## 2023-01-20 DIAGNOSIS — J449 Chronic obstructive pulmonary disease, unspecified: Secondary | ICD-10-CM | POA: Diagnosis not present

## 2023-01-21 ENCOUNTER — Encounter: Payer: Self-pay | Admitting: Internal Medicine

## 2023-01-21 ENCOUNTER — Ambulatory Visit (INDEPENDENT_AMBULATORY_CARE_PROVIDER_SITE_OTHER): Payer: Medicare HMO | Admitting: Internal Medicine

## 2023-01-21 VITALS — BP 112/74 | HR 94 | Temp 98.6°F | Ht 65.0 in | Wt 248.0 lb

## 2023-01-21 DIAGNOSIS — R1013 Epigastric pain: Secondary | ICD-10-CM | POA: Diagnosis not present

## 2023-01-21 NOTE — Assessment & Plan Note (Signed)
Has had some symptoms for a while--that are vague Then sudden more severe spasm (she thinks her diaphragm) and into intestines after taking brovana Bentyl did help Is on protonix  She will hold the brovana--I will notify Ms Cobb in pulmonary Observe for now off the med--to see if things just settle down

## 2023-01-21 NOTE — Progress Notes (Signed)
Subjective:    Patient ID: Kathleen Hatfield, female    DOB: 03/09/1956, 67 y.o.   MRN: [id number]  HPI Here due to abdominal pain With boyfriend  Tried brovana for the first time--2 days ago Felt like her left diaphragm was spasming and then it moved down--?4 hours after the dose Persisted until yesterday Was constant until then  Tried some bentyl and tramadol---these helped  No N/V Mostly drinking--little eating while the pain was there--just 'nibbled" Bowels have been normal--not related to the pain---went yesterday (normal is 2-3 times a day)  Has had some stomach pain for a month or so Not related to semaglutide Does take protonix daily  Current Outpatient Medications on File Prior to Visit  Medication Sig Dispense Refill   albuterol (VENTOLIN HFA) 108 (90 Base) MCG/ACT inhaler Inhale 2 puffs into the lungs 2 (two) to 3 (three) times daily as needed for shortness of breath/wheeze. 18 g 3   Alirocumab (PRALUENT) 150 MG/ML SOAJ Inject 1 mL (150 mg total) into the skin every 14 (fourteen) days. 6 mL 1   arformoterol (BROVANA) 15 MCG/2ML NEBU Take 2 mLs (15 mcg total) by nebulization 2 (two) times daily. 360 mL 3   aspirin EC 81 MG tablet Take 1 tablet (81 mg total) by mouth daily. Swallow whole. 90 tablet 3   benzonatate (TESSALON) 200 MG capsule Take 1 capsule (200 mg total) by mouth 3 (three) times daily as needed. 30 capsule 1   budesonide (PULMICORT) 0.5 MG/2ML nebulizer solution Take 2 mLs (0.5 mg total) by nebulization 2 (two) times daily. 360 mL 3   buPROPion (WELLBUTRIN XL) 300 MG 24 hr tablet Take 1 tablet (300 mg total) by mouth in the morning. 90 tablet 2   Cholecalciferol (VITAMIN D-3) 5000 UNITS TABS Take 5,000 Units by mouth every other day.      colchicine 0.6 MG tablet Take 1 tablet (0.6 mg total) by mouth daily as needed for gout. 30 tablet 1   Continuous Blood Gluc Sensor (DEXCOM G7 SENSOR) MISC 1 Device by Does not apply route as directed. Apply sensor every 10 days  9 each 3   dicyclomine (BENTYL) 20 MG tablet Take 1 tablet (20 mg total) by mouth 4 (four) times daily as needed for GI cramping, pain, nausea/vomiting. 60 tablet 1   DULoxetine (CYMBALTA) 60 MG capsule Take 1 capsule (60 mg total) by mouth daily. 90 capsule 3   estradiol (ESTRACE) 0.1 MG/GM vaginal cream Place 1 Applicatorful vaginally 3 (three) times a week.     ferrous sulfate 325 (65 FE) MG tablet Take 1 tablet (325 mg total) by mouth every other day. 45 tablet 3   fluticasone (FLONASE) 50 MCG/ACT nasal spray Place 2 sprays into both nostrils as needed.     furosemide (LASIX) 20 MG tablet Take 3 tablets (60 mg total) by mouth 2 (two) times daily. 540 tablet 2   gabapentin (NEURONTIN) 300 MG capsule Take 1 capsule (300 mg total) by mouth 2 (two) times daily. 180 capsule 1   HYDROcodone bit-homatropine (HYCODAN) 5-1.5 MG/5ML syrup Take 5 mLs by mouth every 8 (eight) hours as needed for cough. 180 mL 0   insulin aspart (NOVOLOG FLEXPEN) 100 UNIT/ML FlexPen Inject 1-8 Units into the skin See admin instructions. Per sliding scale.  Max daily 45 units     insulin degludec (TRESIBA FLEXTOUCH) 200 UNIT/ML FlexTouch Pen Inject 80 Units into the skin daily.     Insulin Pen Needle (GLOBAL EASE INJECT PEN  NEEDLES) 32G X 4 MM MISC 1 Device by Other route in the morning, at noon, in the evening, and at bedtime. 400 each 3   ketoconazole (NIZORAL) 2 % cream Apply 1 Application topically 2 (two) times daily. 30 g 1   meloxicam (MOBIC) 15 MG tablet Take 1 tablet (15 mg total) by mouth daily as needed for pain.     mesalamine (LIALDA) 1.2 g EC tablet Take 2 tablets (2.4 g total) by mouth 2 (two) times daily. 120 tablet 0   metolazone (ZAROXOLYN) 5 MG tablet TAKE 1 TABLET BY MOUTH EVERY DAY AS NEEDED 30 tablet 0   nitrofurantoin (MACRODANTIN) 50 MG capsule Take 50 mg by mouth at bedtime.     ondansetron (ZOFRAN) 4 MG tablet Take 1 tablet (4 mg total) by mouth every 8 (eight) hours as needed for nausea and vomiting  20 tablet 3   OXYGEN Inhale 2 L into the lungs continuous.     pantoprazole (PROTONIX) 40 MG tablet Take 1 tablet (40 mg total) by mouth every morning. 90 tablet 1   potassium chloride (MICRO-K) 10 MEQ CR capsule Take 2 capsules (20 mEq total) by mouth in the morning AND 1 capsule (10 mEq total) daily at 12 noon AND 2 capsules (20 mEq total) every evening. 450 capsule 1   rOPINIRole (REQUIP) 4 MG tablet Take 1 tablet (4 mg total) by mouth at bedtime. 90 tablet 3   Semaglutide, 2 MG/DOSE, 8 MG/3ML SOPN Inject 2 mg as directed once a week. (Patient taking differently: Inject 2 mg as directed once a week. Saturday) 3 mL 5   Spacer/Aero-Holding Chambers (OPTICHAMBER DIAMOND) MISC Use as dirtected with inhaler 1 each 1   spironolactone (ALDACTONE) 25 MG tablet Take 0.5 tablets (12.5 mg total) by mouth daily. 45 tablet 1   traMADol (ULTRAM) 50 MG tablet Take 1 tablet (50 mg total) by mouth 3 (three) times daily as needed. 30 tablet 1   UNABLE TO FIND CPAP- At bedtime     ipratropium-albuterol (DUONEB) 0.5-2.5 (3) MG/3ML SOLN Take 3 mLs by nebulization every 6 (six) hours as needed (wheezing). 360 mL 6   revefenacin (YUPELRI) 175 MCG/3ML nebulizer solution Take 3 mLs (175 mcg total) by nebulization daily. 270 mL 3   No current facility-administered medications on file prior to visit.    Allergies  Allergen Reactions   Almond Oil Anaphylaxis, Shortness Of Breath and Swelling   Morphine And Codeine Shortness Of Breath and Other (See Comments)    Pt. States while in the hospital it affected her breathing, O2 dropped to the 70's   Primidone Shortness Of Breath        Atorvastatin Other (See Comments)    Leg weakness   Ceclor [Cefaclor] Nausea And Vomiting   Sulfa Antibiotics Nausea And Vomiting   Ciprofloxacin Other (See Comments)    Makes joints and muscles ache   Levaquin [Levofloxacin] Other (See Comments)    Body aches   Losartan Other (See Comments)    Weakness    Peanut-Containing Drug  Products     Pt states she thinks it is causing gout flare-ups   Repatha [Evolocumab]     Aches   Statins Other (See Comments)    Leg and body weakness    Past Medical History:  Diagnosis Date   Anemia    Arthritis    Asthma    Chronic diastolic CHF 05/2014   Echocardiogram 05/2019: EF 70, no RWMA, mild LVH, Gr 1 DD, normal  RVSF, severe LVH, borderline asc Aorta (39 mm)   CKD (chronic kidney disease)    Depression    Diabetes mellitus without complication (HCC)    Diverticulitis    Dyspnea    with exertion   GERD (gastroesophageal reflux disease)    History of blood transfusion    Hyperlipidemia    cannot tolerate statins   Hypertension    patient states she has never had high blood pressure.    Nuclear stress test    Myoview 05/2019: EF 83, no ischemia or infarction; Low Risk   Peripheral neuropathy    Pneumonia    PONV (postoperative nausea and vomiting)    RLS (restless legs syndrome)    Sleep apnea    uses CPAP   Ulcerative colitis (HCC)    dr Loreta Ave    Past Surgical History:  Procedure Laterality Date   ABDOMINAL HYSTERECTOMY     APPENDECTOMY     BIOPSY  10/10/2021   Procedure: BIOPSY;  Surgeon: Jeani Hawking, MD;  Location: [facility];  Service: Gastroenterology;;   BREAST BIOPSY Left    BREAST SURGERY     left biopsy   CARDIAC CATHETERIZATION     CESAREAN SECTION     CHOLECYSTECTOMY     COLONOSCOPY WITH PROPOFOL N/A 10/10/2021   Procedure: COLONOSCOPY WITH PROPOFOL;  Surgeon: Jeani Hawking, MD;  Location: [facility];  Service: Gastroenterology;  Laterality: N/A;   HERNIA REPAIR     INSERTION OF MESH N/A 11/15/2015   Procedure: INSERTION OF MESH;  Surgeon: Karie Soda, MD;  Location: [facility];  Service: General;  Laterality: N/A;   LAPAROSCOPIC LYSIS OF ADHESIONS N/A 11/15/2015   Procedure: LAPAROSCOPIC LYSIS OF ADHESIONS;  Surgeon: Karie Soda, MD;  Location: [facility];  Service: General;  Laterality: N/A;   RIGHT HEART CATHETERIZATION N/A 02/27/2013    Procedure: RIGHT HEART CATH;  Surgeon: Micheline Chapman, MD;  Location: [facility] CATH LAB;  Service: Cardiovascular;  Laterality: N/A;   RIGHT HEART CATHETERIZATION N/A 12/14/2013   Procedure: RIGHT HEART CATH;  Surgeon: Laurey Morale, MD;  Location: [facility] CATH LAB;  Service: Cardiovascular;  Laterality: N/A;   SIGMOIDECTOMY  2010   diverticular disease   VENTRAL HERNIA REPAIR N/A 11/15/2015   Procedure: LAPAROSCOPIC VENTRAL WALL HERNIA REPAIR;  Surgeon: Karie Soda, MD;  Location: [facility];  Service: General;  Laterality: N/A;    Family History  Problem Relation Age of Onset   Heart disease Mother    Hypertension Mother    Dementia Mother    Heart disease Father    COPD Father    Hypertension Father    Heart disease Brother    Other Brother        knee replacement; hip replacement   Other Brother        cant brathe when laying down   Diabetes Maternal Grandmother    Breast cancer Neg Hx    Colon cancer Neg Hx     Social History   Socioeconomic History   Marital status: Divorced    Spouse name: Not on file   Number of children: Not on file   Years of education: Not on file   Highest education level: Not on file  Occupational History   Occupation: unemployed  Tobacco Use   Smoking status: Never    Passive exposure: Past   Smokeless tobacco: Never  Vaping Use   Vaping status: Never Used  Substance and Sexual Activity   Alcohol use: No   Drug  use: No   Sexual activity: Not on file  Other Topics Concern   Not on file  Social History Narrative   Live with spouse   Right handed   Drinks caffiene prn   One floor home   retired   International aid/development worker of Corporate investment banker Strain: Low Risk  (07/14/2022)   Overall Financial Resource Strain (CARDIA)    Difficulty of Paying Living Expenses: Not hard at all  Food Insecurity: No Food Insecurity (12/03/2022)   Hunger Vital Sign    Worried About Running Out of Food in the Last Year: Never true    Ran Out of Food in the  Last Year: Never true  Transportation Needs: No Transportation Needs (11/24/2022)   PRAPARE - Administrator, Civil Service (Medical): No    Lack of Transportation (Non-Medical): No  Physical Activity: Insufficiently Active (07/14/2022)   Exercise Vital Sign    Days of Exercise per Week: 7 days    Minutes of Exercise per Session: 10 min  Stress: No Stress Concern Present (07/14/2022)   [employer] of Occupational Health - Occupational Stress Questionnaire    Feeling of Stress : Not at all  Social Connections: Socially Isolated (07/14/2022)   Social Connection and Isolation Panel [NHANES]    Frequency of Communication with Friends and Family: Never    Frequency of Social Gatherings with Friends and Family: Never    Attends Religious Services: Never    Database administrator or Organizations: No    Attends Banker Meetings: Never    Marital Status: Divorced  Catering manager Violence: Not At Risk (11/24/2022)   Humiliation, Afraid, Rape, and Kick questionnaire    Fear of Current or Ex-Partner: No    Emotionally Abused: No    Physically Abused: No    Sexually Abused: No   Review of Systems No cough or OB No heartburn or dysphagia No urinary frequency, burning or blood---is on preventative nitrofurantoin Also being evaluated for shaking/tremor Legs give way at times    Objective:   Physical Exam Constitutional:      Appearance: She is well-developed.  Cardiovascular:     Rate and Rhythm: Normal rate and regular rhythm.     Heart sounds: No murmur heard.    No gallop.  Pulmonary:     Effort: Pulmonary effort is normal.     Breath sounds: Normal breath sounds. No wheezing or rales.  Abdominal:     General: Bowel sounds are normal.     Palpations: Abdomen is soft.     Tenderness: There is no guarding or rebound.     Comments: Very mild epigastric tenderness--but no lower or suprapubic tenderness  Musculoskeletal:     Cervical back: Neck supple.   Lymphadenopathy:     Cervical: No cervical adenopathy.  Neurological:     Mental Status: She is alert.            Assessment & Plan:

## 2023-01-25 ENCOUNTER — Other Ambulatory Visit (HOSPITAL_COMMUNITY): Payer: Self-pay

## 2023-01-28 ENCOUNTER — Ambulatory Visit: Payer: Medicare HMO | Admitting: Nurse Practitioner

## 2023-01-28 ENCOUNTER — Encounter: Payer: Self-pay | Admitting: Nurse Practitioner

## 2023-01-28 VITALS — BP 136/80 | HR 90 | Ht 65.0 in | Wt 253.6 lb

## 2023-01-28 DIAGNOSIS — J309 Allergic rhinitis, unspecified: Secondary | ICD-10-CM

## 2023-01-28 DIAGNOSIS — J4489 Other specified chronic obstructive pulmonary disease: Secondary | ICD-10-CM | POA: Diagnosis not present

## 2023-01-28 DIAGNOSIS — J9611 Chronic respiratory failure with hypoxia: Secondary | ICD-10-CM

## 2023-01-28 DIAGNOSIS — G4733 Obstructive sleep apnea (adult) (pediatric): Secondary | ICD-10-CM | POA: Diagnosis not present

## 2023-01-28 NOTE — Patient Instructions (Addendum)
Continue Albuterol inhaler 2 puffs or 3 mL neb every 6 hours as needed for shortness of breath or wheezing. Notify if symptoms persist despite rescue inhaler/neb use. Use nebs at least three times a day until symptoms improve. Follow with flutter valve 10 times Continue flonase nasal spray 2 sprays each nostril daily Continue supplemental oxygen 2 lpm for goal >88-90%  Continue nebs... Budesonide 2 mL neb Twice daily. Brush tongue and rinse mouth afterwards  Brovana 2 mL neb neb Twice daily Yupelri 3 mL daily  -Guaifenesin DM (mucinex DM) over the counter Twice daily for cough/congestion -Flonase nasal spray 2 sprays each nostril daily -Saline nasal rinses 1-2 times a day until symptoms improve. Use bottled, distilled water  -Chlortab over the counter 4 mg every 6 hours as needed for allergies/drainage.   Let me know if you don't start feeling better or get worse over the next week or so  Continue to use CPAP every night, minimum of 4-6 hours a night.  Change equipment as directed. Wash your tubing with warm soap and water daily, hang to dry. Wash humidifier portion weekly. Use bottled, distilled water and change daily Be aware of reduced alertness and do not drive or operate heavy machinery if experiencing this or drowsiness.  Exercise encouraged, as tolerated. Healthy weight management discussed.  Avoid or decrease alcohol consumption and medications that make you more sleepy, if possible. Notify if persistent daytime sleepiness occurs even with consistent use of PAP therapy.  Call [facility] about your CPAP    Follow up in 10-12 weeks with Dr. Wynona Neat (establish care). If symptoms do not improve or worsen, please contact office for sooner follow up or seek emergency care.

## 2023-01-28 NOTE — Progress Notes (Signed)
@Patient  ID: Kathleen Hatfield, female    DOB: January 08, 1956, 67 y.o.   MRN: [id number]  Chief Complaint  Patient presents with   Follow-up    COPD/OSA F/U visit. Pt c/o Nasal Drainage and SOB.    Referring provider: Joaquim Nam, MD  HPI: 67 year old female, never smoker followed for OSA on CPAP, COPD/asthma, chronic respiratory failure on supplemental O2.  She is a patient of Dr. Evlyn Courier and last seen in office 08/27/2021. Past medical history significant for HFpEF, HTN, GERD, UC, DM, CKD, HLD.   TEST/EVENTS:  09/01/2012 PSG: AHI 36.1 04/05/2016 V/Q scan: Normal 03/14/2017 ONO with CPAP/ra >> average 93%, SpO2 low 88% 07/24/2019 PFT: FVC 46, FEV1 35, ratio 67, TLC 93, DLCOcor 88.  Severe obstructive lung disease with reversibility  08/27/2021: OV with Dr. Craige Cotta.  Gets chest tightness.  Feels better after using her nebulizer and CPAP.  Does not have Perforomist.  Has occasional cough.  Using CPAP nightly.  Pressure setting is comfortable.  Walked 1 lap in office; maintained SpO2 but had to stop early due to feeling short of breath.  Difficulties using inhalers in the past.  Currently on Yupelri and Pulmicort.  Advised to restart Perforomist.  Order sent.  Has difficulties with tremor and question RLS component.  Previously seen by neurology.  Advised to follow-up with them for further workup.  12/10/2022: Sudie Grumbling with Damek Ende NP for hospital follow-up.  She was recently admitted from 11/23/2022 to 11/30/2022 due to acute on chronic respiratory failure and AECOPD secondary to rhinovirus infection.  She initially required BiPAP therapy.  Was treated with IV steroids and antibiotics with clinical improvement.  She was able to be weaned back to her baseline of 2 L/min supplemental O2.  She was discharged on prednisone taper.  No additional antibiotics were prescribed. Today, she tells me that she feels relatively unchanged compared to when she was discharged.  She took her last dose of prednisone.  She still having a  productive cough with yellow to green phlegm.  Her PCP sent in doxycycline yesterday which she just started.  She still feels like she is more short winded and is having chest tightness.  Has a paroxysmal cough that is keeping her up at night.  Denies any fevers, chills, hemoptysis, increased lower extremity swelling, chest pain.  Having difficulties wearing her CPAP at night due to the cough.  She does also need a new CPAP machine.  Has been having some difficulties with the machine itself.  She has been off therapy for the last few weeks due to being in the hospital, the difficulties with the cough and now she has a piece of her mask that is broken.  Having fatigue during the day.  She does not drive currently.  No sleep parasomnia/paralysis.  01/28/2023: Today - follow up Discussed the use of AI scribe software for clinical note transcription with the patient, who gave verbal consent to proceed.  History of Present Illness   The patient, previously treated for bronchitis and COPD, presents today for follow up. She had a hospitalization in mid-September due to acute on chronic respiratory failure secondary to rhinovirus. The patient's condition did not improve significantly so we treated her with extended prednisone taper. She went to the ED after our visit on 10/4 due to hyperglycemia (BS 550). She was discharged home with instructions to monitor.   The patient reports feeling better since her last visit. Breathing feels back to her normal. Her cough had  entirely resolved. However, she has been experiencing sinus drainage over the past few days. This is causing her postnasal drainage and throat clearing. She tries to avoid coughing as it seems to become paroxysmal when she does cough. Mucus is clear. The patient denies having fevers, chills, hemoptysis, body aches, or worsening dyspnea.  The patient's voice is noted to be hoarse, which she attributes to the sinus drainage. Having an occasional wheeze.    The patient has not been taking any specific treatment for her sinuses. She's on her nebulized regimen for her asthma management. The patient expresses concern about contracting another virus, having recently recovered from rhinovirus. She received a COVID swab during the visit, the result of which was negative. No increased oxygen requirements.       Allergies  Allergen Reactions   Almond Oil Anaphylaxis, Shortness Of Breath and Swelling   Morphine And Codeine Shortness Of Breath and Other (See Comments)    Pt. States while in the hospital it affected her breathing, O2 dropped to the 70's   Primidone Shortness Of Breath        Atorvastatin Other (See Comments)    Leg weakness   Ceclor [Cefaclor] Nausea And Vomiting   Sulfa Antibiotics Nausea And Vomiting   Ciprofloxacin Other (See Comments)    Makes joints and muscles ache   Levaquin [Levofloxacin] Other (See Comments)    Body aches   Losartan Other (See Comments)    Weakness    Peanut-Containing Drug Products     Pt states she thinks it is causing gout flare-ups   Repatha [Evolocumab]     Aches   Statins Other (See Comments)    Leg and body weakness    Immunization History  Administered Date(s) Administered   Fluad Quad(high Dose 65+) 12/31/2021   Influenza Split 12/01/2012, 01/18/2017   Influenza Whole 12/25/2011   Influenza, High Dose Seasonal PF 12/24/2022   Influenza,inj,Quad PF,6+ Mos 11/27/2013, 12/08/2014, 12/11/2015   Influenza-Unspecified 12/07/2017   Janssen (J&J) SARS-COV-2 Vaccination 06/15/2019   Pneumococcal Conjugate-13 02/17/2017   Pneumococcal Polysaccharide-23 11/27/2013   Rsv, Bivalent, Protein Subunit Rsvpref,pf Verdis Frederickson) 12/24/2022   Tdap 04/01/2010   Zoster Recombinant(Shingrix) 10/06/2021, 12/31/2021    Past Medical History:  Diagnosis Date   Anemia    Arthritis    Asthma    Chronic diastolic CHF 05/2014   Echocardiogram 05/2019: EF 70, no RWMA, mild LVH, Gr 1 DD, normal RVSF, severe  LVH, borderline asc Aorta (39 mm)   CKD (chronic kidney disease)    Depression    Diabetes mellitus without complication (HCC)    Diverticulitis    Dyspnea    with exertion   GERD (gastroesophageal reflux disease)    History of blood transfusion    Hyperlipidemia    cannot tolerate statins   Hypertension    patient states she has never had high blood pressure.    Nuclear stress test    Myoview 05/2019: EF 83, no ischemia or infarction; Low Risk   Peripheral neuropathy    Pneumonia    PONV (postoperative nausea and vomiting)    RLS (restless legs syndrome)    Sleep apnea    uses CPAP   Ulcerative colitis (HCC)    dr [address]    Tobacco History: Social History   Tobacco Use  Smoking Status Never   Passive exposure: Past  Smokeless Tobacco Never   Counseling given: Not Answered   Outpatient Medications Prior to Visit  Medication Sig Dispense Refill   albuterol (  VENTOLIN HFA) 108 (90 Base) MCG/ACT inhaler Inhale 2 puffs into the lungs 2 (two) to 3 (three) times daily as needed for shortness of breath/wheeze. 18 g 3   Alirocumab (PRALUENT) 150 MG/ML SOAJ Inject 1 mL (150 mg total) into the skin every 14 (fourteen) days. 6 mL 1   arformoterol (BROVANA) 15 MCG/2ML NEBU Take 2 mLs (15 mcg total) by nebulization 2 (two) times daily. 360 mL 3   aspirin EC 81 MG tablet Take 1 tablet (81 mg total) by mouth daily. Swallow whole. 90 tablet 3   benzonatate (TESSALON) 200 MG capsule Take 1 capsule (200 mg total) by mouth 3 (three) times daily as needed. 30 capsule 1   budesonide (PULMICORT) 0.5 MG/2ML nebulizer solution Take 2 mLs (0.5 mg total) by nebulization 2 (two) times daily. 360 mL 3   buPROPion (WELLBUTRIN XL) 300 MG 24 hr tablet Take 1 tablet (300 mg total) by mouth in the morning. 90 tablet 2   Cholecalciferol (VITAMIN D-3) 5000 UNITS TABS Take 5,000 Units by mouth every other day.      colchicine 0.6 MG tablet Take 1 tablet (0.6 mg total) by mouth daily as needed for gout. 30  tablet 1   Continuous Blood Gluc Sensor (DEXCOM G7 SENSOR) MISC 1 Device by Does not apply route as directed. Apply sensor every 10 days 9 each 3   dicyclomine (BENTYL) 20 MG tablet Take 1 tablet (20 mg total) by mouth 4 (four) times daily as needed for GI cramping, pain, nausea/vomiting. 60 tablet 1   DULoxetine (CYMBALTA) 60 MG capsule Take 1 capsule (60 mg total) by mouth daily. 90 capsule 3   estradiol (ESTRACE) 0.1 MG/GM vaginal cream Place 1 Applicatorful vaginally 3 (three) times a week.     ferrous sulfate 325 (65 FE) MG tablet Take 1 tablet (325 mg total) by mouth every other day. 45 tablet 3   fluticasone (FLONASE) 50 MCG/ACT nasal spray Place 2 sprays into both nostrils as needed.     furosemide (LASIX) 20 MG tablet Take 3 tablets (60 mg total) by mouth 2 (two) times daily. 540 tablet 2   gabapentin (NEURONTIN) 300 MG capsule Take 1 capsule (300 mg total) by mouth 2 (two) times daily. 180 capsule 1   HYDROcodone bit-homatropine (HYCODAN) 5-1.5 MG/5ML syrup Take 5 mLs by mouth every 8 (eight) hours as needed for cough. 180 mL 0   insulin aspart (NOVOLOG FLEXPEN) 100 UNIT/ML FlexPen Inject 1-8 Units into the skin See admin instructions. Per sliding scale.  Max daily 45 units     insulin degludec (TRESIBA FLEXTOUCH) 200 UNIT/ML FlexTouch Pen Inject 80 Units into the skin daily.     Insulin Pen Needle (GLOBAL EASE INJECT PEN NEEDLES) 32G X 4 MM MISC 1 Device by Other route in the morning, at noon, in the evening, and at bedtime. 400 each 3   ipratropium-albuterol (DUONEB) 0.5-2.5 (3) MG/3ML SOLN Take 3 mLs by nebulization every 6 (six) hours as needed (wheezing). 360 mL 6   ketoconazole (NIZORAL) 2 % cream Apply 1 Application topically 2 (two) times daily. 30 g 1   meloxicam (MOBIC) 15 MG tablet Take 1 tablet (15 mg total) by mouth daily as needed for pain.     mesalamine (LIALDA) 1.2 g EC tablet Take 2 tablets (2.4 g total) by mouth 2 (two) times daily. 120 tablet 0   metolazone (ZAROXOLYN)  5 MG tablet TAKE 1 TABLET BY MOUTH EVERY DAY AS NEEDED 30 tablet 0  nitrofurantoin (MACRODANTIN) 50 MG capsule Take 50 mg by mouth at bedtime.     ondansetron (ZOFRAN) 4 MG tablet Take 1 tablet (4 mg total) by mouth every 8 (eight) hours as needed for nausea and vomiting 20 tablet 3   OXYGEN Inhale 2 L into the lungs continuous.     pantoprazole (PROTONIX) 40 MG tablet Take 1 tablet (40 mg total) by mouth every morning. 90 tablet 1   potassium chloride (MICRO-K) 10 MEQ CR capsule Take 2 capsules (20 mEq total) by mouth in the morning AND 1 capsule (10 mEq total) daily at 12 noon AND 2 capsules (20 mEq total) every evening. 450 capsule 1   revefenacin (YUPELRI) 175 MCG/3ML nebulizer solution Take 3 mLs (175 mcg total) by nebulization daily. 270 mL 3   rOPINIRole (REQUIP) 4 MG tablet Take 1 tablet (4 mg total) by mouth at bedtime. 90 tablet 3   Semaglutide, 2 MG/DOSE, 8 MG/3ML SOPN Inject 2 mg as directed once a week. (Patient taking differently: Inject 2 mg as directed once a week. Saturday) 3 mL 5   Spacer/Aero-Holding Chambers (OPTICHAMBER DIAMOND) MISC Use as dirtected with inhaler 1 each 1   spironolactone (ALDACTONE) 25 MG tablet Take 0.5 tablets (12.5 mg total) by mouth daily. 45 tablet 1   traMADol (ULTRAM) 50 MG tablet Take 1 tablet (50 mg total) by mouth 3 (three) times daily as needed. 30 tablet 1   UNABLE TO FIND CPAP- At bedtime     No facility-administered medications prior to visit.     Review of Systems:   Constitutional: No weight loss or gain, night sweats, fevers, chills, fatigue or lassitude.  HEENT: No headaches, difficulty swallowing, tooth/dental problems, or sore throat. No sneezing, itching, ear ache. +nasal congestion, post nasal drip, voice hoarseness, throat clearing CV:  No chest pain, orthopnea, PND, swelling in lower extremities, anasarca, dizziness, palpitations, syncope Resp: +baseline shortness of breath with exertion; intermittent paroxysmal cough; rare  wheeze. No hemoptysis.  No chest wall deformity GI:  No heartburn, indigestion, abdominal pain, nausea, vomiting, diarrhea, change in bowel habits, loss of appetite, bloody stools.  GU: No dysuria, change in color of urine, urgency or frequency.  Skin: No rash, lesions, ulcerations MSK:  +chronic joint pain  Neuro: No dizziness or lightheadedness.  Psych: No depression or anxiety. Mood stable.     Physical Exam:  BP 136/80 (BP Location: Right Arm, Cuff Size: Large)   Pulse 90   Ht 5\' 5"  (1.651 m)   Wt 253 lb 9.6 oz (115 kg)   SpO2 96%   BMI 42.20 kg/m   GEN: Pleasant, interactive, chronically-ill appearing; morbidly obese; in no acute distress. HEENT:  Normocephalic and atraumatic. EACs patent bilaterally. TM pearly gray with present light reflex bilaterally. PERRLA. Sclera white. Nasal turbinates pale, moist and patent bilaterally. No rhinorrhea present. Oropharynx pink and moist, without exudate or edema. No lesions, ulcerations, or postnasal drip.  NECK:  Supple w/ fair ROM. No JVD present. Normal carotid impulses w/o bruits. Thyroid symmetrical with no goiter or nodules palpated. No lymphadenopathy.   CV: RRR, no m/r/g, no peripheral edema. Pulses intact, +2 bilaterally. No cyanosis, pallor or clubbing. PULMONARY:  Unlabored, regular breathing. Clear bilaterally A&P w/o wheezes/rales/rhonchi. No accessory muscle use.  GI: BS present and normoactive. Soft, non-tender to palpation. No organomegaly or masses detected.  MSK: No erythema, warmth or tenderness. Cap refil <2 sec all extrem. No deformities or joint swelling noted.  Neuro: A/Ox3. No focal deficits noted.  Skin: Warm, no lesions or rashe Psych: Normal affect and behavior. Judgement and thought content appropriate.     Lab Results:  CBC    Component Value Date/Time   WBC 10.6 (H) 12/11/2022 1321   RBC 4.56 12/11/2022 1321   HGB 14.0 12/11/2022 1321   HGB 14.1 02/25/2022 1031   HCT 42.7 12/11/2022 1321   HCT 44.7  02/25/2022 1031   PLT 301 12/11/2022 1321   PLT 275 02/25/2022 1031   MCV 93.6 12/11/2022 1321   MCV 91 02/25/2022 1031   MCH 30.7 12/11/2022 1321   MCHC 32.8 12/11/2022 1321   RDW 13.6 12/11/2022 1321   RDW 12.9 02/25/2022 1031   LYMPHSABS 2.2 12/08/2022 1514   MONOABS 0.7 12/08/2022 1514   EOSABS 0.1 12/08/2022 1514   BASOSABS 0.1 12/08/2022 1514    BMET    Component Value Date/Time   NA 137 12/11/2022 1321   NA 143 11/04/2022 1527   NA 137 09/04/2019 0000   K 3.9 12/11/2022 1321   K 3.6 09/04/2019 0000   CL 98 12/11/2022 1321   CL 89 09/04/2019 0000   CO2 29 12/11/2022 1321   CO2 38 09/04/2019 0000   GLUCOSE 129 (H) 12/11/2022 1321   BUN 20 12/11/2022 1321   BUN 30 (H) 11/04/2022 1527   CREATININE 1.04 (H) 12/11/2022 1321   CREATININE 1.57 (H) 06/26/2022 1520   CALCIUM 9.8 12/11/2022 1321   CALCIUM 10.1 09/04/2019 0000   GFRNONAA 59 (L) 12/11/2022 1321   GFRAA 69 01/19/2020 1356    BNP    Component Value Date/Time   BNP 40.3 02/14/2021 0109   BNP 45 02/07/2021 1545     Imaging:  No results found.  methylPREDNISolone acetate (DEPO-MEDROL) injection 80 mg     Date Action Dose Route User   Discharged on 12/11/2022   Admitted on 12/11/2022   12/10/2022 1528 Given 80 mg Intramuscular (Left Deltoid) Jama Flavors, CMA          Latest Ref Rng & Units 07/24/2019    9:40 AM 07/02/2015    1:10 PM 07/12/2012    1:04 PM  PFT Results  FVC-Pre L 1.53  1.85    FVC-Predicted Pre % 46  53  57   FVC-Post L 1.62  2.18  2.20   FVC-Predicted Post % 48  63  65   Pre FEV1/FVC % % 59  65  60   Post FEV1/FCV % % 67  69  66   FEV1-Pre L 0.91  1.20  1.18   FEV1-Predicted Pre % 35  45  45   FEV1-Post L 1.09  1.50  1.44   DLCO uncorrected ml/min/mmHg 17.94  15.55  20.96   DLCO UNC% % 87  60  86   DLCO corrected ml/min/mmHg 18.17  17.09    DLCO COR %Predicted % 88  66    DLVA Predicted % 149  99  114   TLC L 4.85  4.99  4.09   TLC % Predicted % 93  96  81   RV %  Predicted % 153  133  136     No results found for: "NITRICOXIDE"      Assessment & Plan:     COPD/asthma  Resolved exacerbation with clinical improvement. Currently experiencing postnasal drainage and throat clearing without significant cough or worsening dyspnea. Does not appear to be in acute exacerbation. COVID test negative today. Continue triple therapy regimen. Action plan in place.  - Continue  arformoterol, budesonide, and Yupelri nebs - Continue albuterol as needed - COVID swab negative to rule out viral infection - Supportive care with Mucinex DM twice a day; Flonase nasal spray, two sprays each nostril daily; Saline rinses with bottled distilled water one to two times a day - Call if symptoms worsen or do not improve  Chronic Respiratory Failure Stable on current settings without increased O2 requirement. Goal >88-90% - Monitor respiratory status - Continue current COPD management  Hyperglycemia Resolved  Allergic rhinitis  Flare in nasal symptoms with postnasal drainage. Associated upper airway irritation. Possible URI vs allergies. COVID negative. Supportive care measures. See above - Supportive care - Chlortab 4 mg q 6 hr OTC as needed for cough/drainage    OSA on CPAP Severe OSA on CPAP.  Having difficulties with CPAP machine cutting off on her and making a lot of noise.  She had previously reach out to her DME company who told her she was not due for new machine and did not offer her any support.  CMA reach out to [facility] today.  Manager asked for the patient to give her a call to help troubleshoot through this.  Aware of risks of untreated sleep apnea.  Does receive benefit from use of CPAP.  Aware of safe driving practices. - Continue CPAP nightly - Maintain proper care of CPAP machine - Take CPAP to [facility] for troubleshooting     If symptoms do not improve or worsen, advised to please contact office for sooner follow up or seek emergency care.   I spent 35  minutes of dedicated to the care of this patient on the date of this encounter to include pre-visit review of records, face-to-face time with the patient discussing conditions above, post visit ordering of testing, clinical documentation with the electronic health record, making appropriate referrals as documented, and communicating necessary findings to members of the patients care team.  Noemi Chapel, NP 01/28/2023  Pt aware and understands NP's role.

## 2023-02-02 ENCOUNTER — Other Ambulatory Visit (HOSPITAL_COMMUNITY): Payer: Self-pay

## 2023-02-02 ENCOUNTER — Other Ambulatory Visit: Payer: Self-pay

## 2023-02-09 ENCOUNTER — Other Ambulatory Visit: Payer: Self-pay

## 2023-02-17 ENCOUNTER — Other Ambulatory Visit (HOSPITAL_COMMUNITY): Payer: Self-pay

## 2023-02-17 ENCOUNTER — Other Ambulatory Visit: Payer: Self-pay

## 2023-02-17 DIAGNOSIS — N390 Urinary tract infection, site not specified: Secondary | ICD-10-CM | POA: Diagnosis not present

## 2023-02-17 DIAGNOSIS — R350 Frequency of micturition: Secondary | ICD-10-CM | POA: Diagnosis not present

## 2023-02-17 MED ORDER — NITROFURANTOIN MACROCRYSTAL 100 MG PO CAPS
100.0000 mg | ORAL_CAPSULE | Freq: Every day | ORAL | 3 refills | Status: DC
  Filled 2023-02-17 – 2023-02-24 (×3): qty 90, 90d supply, fill #0
  Filled 2023-02-25 – 2023-03-04 (×3): qty 30, 30d supply, fill #0

## 2023-02-17 MED ORDER — NITROFURANTOIN MACROCRYSTAL 100 MG PO CAPS
100.0000 mg | ORAL_CAPSULE | Freq: Two times a day (BID) | ORAL | 0 refills | Status: DC
  Filled 2023-02-17 (×2): qty 14, 7d supply, fill #0

## 2023-02-19 DIAGNOSIS — I10 Essential (primary) hypertension: Secondary | ICD-10-CM | POA: Diagnosis not present

## 2023-02-19 DIAGNOSIS — G4733 Obstructive sleep apnea (adult) (pediatric): Secondary | ICD-10-CM | POA: Diagnosis not present

## 2023-02-19 DIAGNOSIS — J449 Chronic obstructive pulmonary disease, unspecified: Secondary | ICD-10-CM | POA: Diagnosis not present

## 2023-02-23 ENCOUNTER — Other Ambulatory Visit: Payer: Self-pay

## 2023-02-24 ENCOUNTER — Other Ambulatory Visit: Payer: Self-pay

## 2023-02-24 ENCOUNTER — Other Ambulatory Visit (HOSPITAL_COMMUNITY): Payer: Self-pay

## 2023-02-24 MED ORDER — SULFAMETHOXAZOLE-TRIMETHOPRIM 800-160 MG PO TABS
1.0000 | ORAL_TABLET | Freq: Two times a day (BID) | ORAL | 0 refills | Status: DC
  Filled 2023-02-24: qty 14, 7d supply, fill #0

## 2023-02-25 ENCOUNTER — Other Ambulatory Visit (HOSPITAL_COMMUNITY): Payer: Self-pay

## 2023-02-25 ENCOUNTER — Other Ambulatory Visit: Payer: Self-pay

## 2023-02-26 ENCOUNTER — Other Ambulatory Visit: Payer: Self-pay

## 2023-03-01 ENCOUNTER — Other Ambulatory Visit (HOSPITAL_COMMUNITY): Payer: Self-pay

## 2023-03-01 ENCOUNTER — Other Ambulatory Visit: Payer: Self-pay

## 2023-03-01 DIAGNOSIS — N390 Urinary tract infection, site not specified: Secondary | ICD-10-CM | POA: Diagnosis not present

## 2023-03-01 MED ORDER — MESALAMINE 1.2 G PO TBEC
2.4000 g | DELAYED_RELEASE_TABLET | Freq: Two times a day (BID) | ORAL | 2 refills | Status: AC
  Filled 2023-03-01 – 2023-03-04 (×3): qty 120, 30d supply, fill #0
  Filled 2023-03-15: qty 120, 30d supply, fill #1
  Filled ????-??-??: fill #1

## 2023-03-02 ENCOUNTER — Other Ambulatory Visit: Payer: Self-pay

## 2023-03-04 ENCOUNTER — Other Ambulatory Visit: Payer: Self-pay

## 2023-03-05 DIAGNOSIS — G4733 Obstructive sleep apnea (adult) (pediatric): Secondary | ICD-10-CM | POA: Diagnosis not present

## 2023-03-06 ENCOUNTER — Other Ambulatory Visit (HOSPITAL_COMMUNITY): Payer: Self-pay

## 2023-03-06 DIAGNOSIS — E1165 Type 2 diabetes mellitus with hyperglycemia: Secondary | ICD-10-CM | POA: Diagnosis not present

## 2023-03-07 ENCOUNTER — Encounter (HOSPITAL_COMMUNITY): Payer: Self-pay

## 2023-03-08 ENCOUNTER — Other Ambulatory Visit: Payer: Self-pay | Admitting: Family Medicine

## 2023-03-08 ENCOUNTER — Other Ambulatory Visit (HOSPITAL_COMMUNITY): Payer: Self-pay

## 2023-03-09 ENCOUNTER — Other Ambulatory Visit: Payer: Self-pay | Admitting: Family Medicine

## 2023-03-09 ENCOUNTER — Other Ambulatory Visit: Payer: Self-pay | Admitting: Urology

## 2023-03-09 ENCOUNTER — Other Ambulatory Visit (HOSPITAL_COMMUNITY): Payer: Self-pay

## 2023-03-09 ENCOUNTER — Other Ambulatory Visit: Payer: Self-pay

## 2023-03-09 MED ORDER — SEMAGLUTIDE (2 MG/DOSE) 8 MG/3ML ~~LOC~~ SOPN
2.0000 mg | PEN_INJECTOR | SUBCUTANEOUS | 5 refills | Status: DC
  Filled 2023-03-09: qty 3, 28d supply, fill #0

## 2023-03-09 NOTE — Telephone Encounter (Signed)
Last refill: 07/29/22 3mL w/ 5 refills  LOV 12/08/22 acute  NOV not scheduled

## 2023-03-11 ENCOUNTER — Other Ambulatory Visit: Payer: Self-pay

## 2023-03-11 ENCOUNTER — Other Ambulatory Visit (HOSPITAL_COMMUNITY): Payer: Self-pay

## 2023-03-11 ENCOUNTER — Other Ambulatory Visit: Payer: Self-pay | Admitting: Family Medicine

## 2023-03-11 MED ORDER — DULOXETINE HCL 60 MG PO CPEP
60.0000 mg | ORAL_CAPSULE | Freq: Every evening | ORAL | 3 refills | Status: AC
  Filled 2023-03-11 – 2023-03-13 (×3): qty 90, 90d supply, fill #0
  Filled 2023-03-30 (×2): qty 30, 30d supply, fill #0
  Filled 2023-04-30: qty 30, 30d supply, fill #1
  Filled 2023-05-24: qty 30, 30d supply, fill #2

## 2023-03-12 ENCOUNTER — Other Ambulatory Visit: Payer: Self-pay

## 2023-03-12 ENCOUNTER — Other Ambulatory Visit (HOSPITAL_COMMUNITY): Payer: Self-pay

## 2023-03-12 NOTE — Telephone Encounter (Signed)
 Refill denied, she is a patient at [facility], not [facility]

## 2023-03-12 NOTE — Telephone Encounter (Signed)
 Last office visit: 12/08/2022 Next office visit: nothing scheduled Last refill:  insulin degludec (TRESIBA FLEXTOUCH) 200 UNIT/ML FlexTouch Pen 12/08/22 metolazone (ZAROXOLYN) 5 MG tablet 12/01/2021

## 2023-03-13 ENCOUNTER — Other Ambulatory Visit: Payer: Self-pay | Admitting: Family Medicine

## 2023-03-14 MED ORDER — TRESIBA FLEXTOUCH 200 UNIT/ML ~~LOC~~ SOPN
80.0000 [IU] | PEN_INJECTOR | Freq: Every day | SUBCUTANEOUS | 3 refills | Status: DC
  Filled 2023-03-14: qty 15, fill #0
  Filled 2023-03-15: qty 15, 37d supply, fill #0

## 2023-03-14 MED ORDER — METOLAZONE 5 MG PO TABS
5.0000 mg | ORAL_TABLET | Freq: Every day | ORAL | 0 refills | Status: DC | PRN
  Filled 2023-03-14 – 2023-03-15 (×2): qty 30, 30d supply, fill #0

## 2023-03-14 NOTE — Telephone Encounter (Signed)
 Sent with request to schedule f/u.

## 2023-03-15 ENCOUNTER — Other Ambulatory Visit (HOSPITAL_COMMUNITY): Payer: Self-pay

## 2023-03-15 ENCOUNTER — Encounter: Payer: Self-pay | Admitting: Pharmacist

## 2023-03-15 ENCOUNTER — Other Ambulatory Visit: Payer: Self-pay

## 2023-03-15 MED ORDER — FLUTICASONE PROPIONATE 50 MCG/ACT NA SUSP
2.0000 | NASAL | 2 refills | Status: DC | PRN
  Filled 2023-03-15: qty 16, 30d supply, fill #0

## 2023-03-16 ENCOUNTER — Other Ambulatory Visit (HOSPITAL_COMMUNITY): Payer: Self-pay

## 2023-03-17 ENCOUNTER — Other Ambulatory Visit: Payer: Self-pay

## 2023-03-18 ENCOUNTER — Ambulatory Visit (INDEPENDENT_AMBULATORY_CARE_PROVIDER_SITE_OTHER): Payer: Medicare HMO | Admitting: Family Medicine

## 2023-03-18 ENCOUNTER — Other Ambulatory Visit: Payer: Self-pay

## 2023-03-18 ENCOUNTER — Encounter: Payer: Self-pay | Admitting: Family Medicine

## 2023-03-18 ENCOUNTER — Encounter (HOSPITAL_COMMUNITY): Payer: Self-pay

## 2023-03-18 ENCOUNTER — Other Ambulatory Visit (HOSPITAL_COMMUNITY): Payer: Self-pay

## 2023-03-18 VITALS — BP 138/64 | HR 93 | Temp 98.8°F | Ht 65.0 in

## 2023-03-18 DIAGNOSIS — E1169 Type 2 diabetes mellitus with other specified complication: Secondary | ICD-10-CM

## 2023-03-18 DIAGNOSIS — Z7984 Long term (current) use of oral hypoglycemic drugs: Secondary | ICD-10-CM

## 2023-03-18 DIAGNOSIS — J4489 Other specified chronic obstructive pulmonary disease: Secondary | ICD-10-CM | POA: Diagnosis not present

## 2023-03-18 DIAGNOSIS — R21 Rash and other nonspecific skin eruption: Secondary | ICD-10-CM

## 2023-03-18 DIAGNOSIS — R1030 Lower abdominal pain, unspecified: Secondary | ICD-10-CM

## 2023-03-18 DIAGNOSIS — E782 Mixed hyperlipidemia: Secondary | ICD-10-CM

## 2023-03-18 DIAGNOSIS — R251 Tremor, unspecified: Secondary | ICD-10-CM

## 2023-03-18 LAB — COMPREHENSIVE METABOLIC PANEL
ALT: 31 U/L (ref 0–35)
AST: 30 U/L (ref 0–37)
Albumin: 4.2 g/dL (ref 3.5–5.2)
Alkaline Phosphatase: 75 U/L (ref 39–117)
BUN: 32 mg/dL — ABNORMAL HIGH (ref 6–23)
CO2: 40 meq/L — ABNORMAL HIGH (ref 19–32)
Calcium: 10.3 mg/dL (ref 8.4–10.5)
Chloride: 86 meq/L — ABNORMAL LOW (ref 96–112)
Creatinine, Ser: 1.29 mg/dL — ABNORMAL HIGH (ref 0.40–1.20)
GFR: 42.75 mL/min — ABNORMAL LOW (ref >60.00)
Glucose, Bld: 215 mg/dL — ABNORMAL HIGH (ref 70–99)
Potassium: 3.1 meq/L — ABNORMAL LOW (ref 3.5–5.1)
Sodium: 137 meq/L (ref 135–145)
Total Bilirubin: 0.5 mg/dL (ref 0.2–1.2)
Total Protein: 7.1 g/dL (ref 6.0–8.3)

## 2023-03-18 LAB — CBC WITH DIFFERENTIAL/PLATELET
Basophils Absolute: 0 10*3/uL (ref 0.0–0.1)
Basophils Relative: 0.3 % (ref 0.0–3.0)
Eosinophils Absolute: 0.2 10*3/uL (ref 0.0–0.7)
Eosinophils Relative: 2.2 % (ref 0.0–5.0)
HCT: 42 % (ref 36.0–46.0)
Hemoglobin: 13.8 g/dL (ref 12.0–15.0)
Lymphocytes Relative: 40.9 % (ref 12.0–46.0)
Lymphs Abs: 3.6 10*3/uL (ref 0.7–4.0)
MCHC: 32.7 g/dL (ref 30.0–36.0)
MCV: 94.5 fL (ref 78.0–100.0)
Monocytes Absolute: 1 10*3/uL (ref 0.1–1.0)
Monocytes Relative: 11.3 % (ref 3.0–12.0)
Neutro Abs: 3.9 10*3/uL (ref 1.4–7.7)
Neutrophils Relative %: 45.3 % (ref 43.0–77.0)
Platelets: 281 10*3/uL (ref 150.0–400.0)
RBC: 4.44 Mil/uL (ref 3.87–5.11)
RDW: 14.7 % (ref 11.5–15.5)
WBC: 8.7 10*3/uL (ref 4.0–10.5)

## 2023-03-18 LAB — LIPASE: Lipase: 11 U/L (ref 11.0–59.0)

## 2023-03-18 LAB — HEMOGLOBIN A1C: Hgb A1c MFr Bld: 10.3 % — ABNORMAL HIGH (ref 4.6–6.5)

## 2023-03-18 MED ORDER — NITROFURANTOIN MONOHYD MACRO 100 MG PO CAPS: 100.0000 mg | ORAL_CAPSULE | Freq: Every day | ORAL | Status: DC

## 2023-03-18 MED ORDER — TRIAMCINOLONE ACETONIDE 0.1 % EX CREA
1.0000 | TOPICAL_CREAM | Freq: Two times a day (BID) | CUTANEOUS | 0 refills | Status: AC | PRN
  Filled 2023-03-18: qty 30, 15d supply, fill #0

## 2023-03-18 NOTE — Patient Instructions (Addendum)
 Please check on your pulmicort  rx.  Update pulmonary if you don't have that med.   You could use triamcinolone  cream on the patch on your neck.  See if that helps.   Go to the lab on the way out.   If you have mychart we'll likely use that to update you.    Stop ozempic  in the meantime and let me know if the abdominal cramping continues or gets better.  Take care.  Glad to see you.

## 2023-03-18 NOTE — Progress Notes (Signed)
 Occ shivering movement, with prev neuro eval.  Primidone  helped with episodes but she felt SOB with use and stopped that med.  Unclear if inhaler/neb use contributes to sx, ie arfomoterol.  She didn't see any benefit with yupelri  and stopped that in the meantime.   She has likely been off pulmicort  for a few weeks, d/w pt.  I found out about this today.  She should have refills at the pharmacy.  Discussed.    She is taking metolazone  1-2 times per week at baseline.   She is taking ozempic  2mg  weekly.  Restarted that last week.  She was off for about 6 weeks prior per patient report.  Abd sx predate restart but increased with use.  Constant abd discomfort. More cramping with eating.  Sx started about 6 weeks ago.  Lower abd pain. No blood in stool.    Sugar has been ~280 over the last month.    No blood in stools.    Small irritated patch on the posterior neck, unclear if from CPAP strap.  Has used hydrocortisone locally.  It can itch.   Meds, vitals, and allergies reviewed.   ROS: Per HPI unless specifically indicated in ROS section   Nad In wheelchair ncat Small irritated patch on the posterior neck, unclear if from CPAP strap.  Neck supple, no LA Rrr Ctab Lower epigastrum ttp L of midline w/o rebound.  Trace BLE edema.  Skin well perfused.

## 2023-03-21 DIAGNOSIS — R21 Rash and other nonspecific skin eruption: Secondary | ICD-10-CM | POA: Insufficient documentation

## 2023-03-21 DIAGNOSIS — R251 Tremor, unspecified: Secondary | ICD-10-CM | POA: Insufficient documentation

## 2023-03-21 NOTE — Assessment & Plan Note (Signed)
 She could use triamcinolone cream locally and update me as needed.

## 2023-03-21 NOTE — Assessment & Plan Note (Signed)
 Episodic.  With prev neuro eval, unclear how much is this med related. See above.  She couldn't tolerate primidone.

## 2023-03-21 NOTE — Assessment & Plan Note (Signed)
 Discussed options.  Stop ozempic in the meantime and she can let me know if the abdominal cramping continues or gets better.  See notes on labs.

## 2023-03-21 NOTE — Assessment & Plan Note (Signed)
 I asked her to check on her pulmicort rx.  She can update pulmonary if she doesn't have that med.

## 2023-03-22 DIAGNOSIS — J449 Chronic obstructive pulmonary disease, unspecified: Secondary | ICD-10-CM | POA: Diagnosis not present

## 2023-03-22 DIAGNOSIS — G4733 Obstructive sleep apnea (adult) (pediatric): Secondary | ICD-10-CM | POA: Diagnosis not present

## 2023-03-22 DIAGNOSIS — I10 Essential (primary) hypertension: Secondary | ICD-10-CM | POA: Diagnosis not present

## 2023-03-23 DIAGNOSIS — E1165 Type 2 diabetes mellitus with hyperglycemia: Secondary | ICD-10-CM | POA: Diagnosis not present

## 2023-03-23 DIAGNOSIS — Z8249 Family history of ischemic heart disease and other diseases of the circulatory system: Secondary | ICD-10-CM | POA: Diagnosis not present

## 2023-03-23 DIAGNOSIS — I110 Hypertensive heart disease with heart failure: Secondary | ICD-10-CM | POA: Diagnosis not present

## 2023-03-23 DIAGNOSIS — Z008 Encounter for other general examination: Secondary | ICD-10-CM | POA: Diagnosis not present

## 2023-03-23 DIAGNOSIS — E785 Hyperlipidemia, unspecified: Secondary | ICD-10-CM | POA: Diagnosis not present

## 2023-03-23 DIAGNOSIS — Z794 Long term (current) use of insulin: Secondary | ICD-10-CM | POA: Diagnosis not present

## 2023-03-23 DIAGNOSIS — K219 Gastro-esophageal reflux disease without esophagitis: Secondary | ICD-10-CM | POA: Diagnosis not present

## 2023-03-23 DIAGNOSIS — E1142 Type 2 diabetes mellitus with diabetic polyneuropathy: Secondary | ICD-10-CM | POA: Diagnosis not present

## 2023-03-23 DIAGNOSIS — I509 Heart failure, unspecified: Secondary | ICD-10-CM | POA: Diagnosis not present

## 2023-03-23 DIAGNOSIS — M1990 Unspecified osteoarthritis, unspecified site: Secondary | ICD-10-CM | POA: Diagnosis not present

## 2023-03-23 DIAGNOSIS — J4489 Other specified chronic obstructive pulmonary disease: Secondary | ICD-10-CM | POA: Diagnosis not present

## 2023-03-23 DIAGNOSIS — F320 Major depressive disorder, single episode, mild: Secondary | ICD-10-CM | POA: Diagnosis not present

## 2023-03-26 ENCOUNTER — Other Ambulatory Visit: Payer: Self-pay

## 2023-03-30 ENCOUNTER — Other Ambulatory Visit (HOSPITAL_COMMUNITY): Payer: Self-pay

## 2023-03-30 ENCOUNTER — Other Ambulatory Visit: Payer: Self-pay

## 2023-03-31 ENCOUNTER — Other Ambulatory Visit: Payer: Self-pay | Admitting: Family Medicine

## 2023-03-31 ENCOUNTER — Other Ambulatory Visit (HOSPITAL_COMMUNITY): Payer: Self-pay

## 2023-03-31 MED ORDER — NITROFURANTOIN MACROCRYSTAL 100 MG PO CAPS
100.0000 mg | ORAL_CAPSULE | Freq: Every day | ORAL | 3 refills | Status: DC
  Filled 2023-03-31: qty 30, 30d supply, fill #0
  Filled 2023-05-06: qty 30, 30d supply, fill #1
  Filled 2023-05-24: qty 30, 30d supply, fill #2

## 2023-03-31 NOTE — Telephone Encounter (Signed)
Yes, the patient will reach out to urology.

## 2023-03-31 NOTE — Telephone Encounter (Signed)
Please check with patient.  I thought this rx was being addressed by urology.  Thanks.

## 2023-04-01 ENCOUNTER — Other Ambulatory Visit: Payer: Self-pay

## 2023-04-02 ENCOUNTER — Other Ambulatory Visit (HOSPITAL_COMMUNITY): Payer: Self-pay

## 2023-04-02 ENCOUNTER — Encounter (HOSPITAL_COMMUNITY): Payer: Self-pay

## 2023-04-08 ENCOUNTER — Telehealth: Payer: Self-pay | Admitting: Family Medicine

## 2023-04-08 NOTE — Telephone Encounter (Signed)
Wrong office

## 2023-04-08 NOTE — Telephone Encounter (Signed)
Kathleen Hatfield is requesting a chart notes on cgm discussion  including the last office and

## 2023-04-09 ENCOUNTER — Telehealth: Payer: Self-pay

## 2023-04-09 ENCOUNTER — Ambulatory Visit (INDEPENDENT_AMBULATORY_CARE_PROVIDER_SITE_OTHER): Payer: Medicare HMO | Admitting: Internal Medicine

## 2023-04-09 ENCOUNTER — Encounter: Payer: Self-pay | Admitting: Internal Medicine

## 2023-04-09 ENCOUNTER — Other Ambulatory Visit (HOSPITAL_COMMUNITY): Payer: Self-pay

## 2023-04-09 ENCOUNTER — Other Ambulatory Visit: Payer: Self-pay

## 2023-04-09 VITALS — BP 134/82 | HR 88 | Ht 65.0 in | Wt 255.0 lb

## 2023-04-09 DIAGNOSIS — E1159 Type 2 diabetes mellitus with other circulatory complications: Secondary | ICD-10-CM | POA: Diagnosis not present

## 2023-04-09 DIAGNOSIS — E1165 Type 2 diabetes mellitus with hyperglycemia: Secondary | ICD-10-CM

## 2023-04-09 DIAGNOSIS — Z794 Long term (current) use of insulin: Secondary | ICD-10-CM

## 2023-04-09 DIAGNOSIS — N1831 Chronic kidney disease, stage 3a: Secondary | ICD-10-CM

## 2023-04-09 DIAGNOSIS — E1122 Type 2 diabetes mellitus with diabetic chronic kidney disease: Secondary | ICD-10-CM | POA: Diagnosis not present

## 2023-04-09 MED ORDER — TRESIBA FLEXTOUCH 200 UNIT/ML ~~LOC~~ SOPN
110.0000 [IU] | PEN_INJECTOR | Freq: Every day | SUBCUTANEOUS | 3 refills | Status: DC
  Filled 2023-04-09: qty 15, 27d supply, fill #0
  Filled 2023-05-24: qty 15, 27d supply, fill #1

## 2023-04-09 MED ORDER — TIRZEPATIDE 2.5 MG/0.5ML ~~LOC~~ SOAJ
2.5000 mg | SUBCUTANEOUS | 3 refills | Status: DC
  Filled 2023-04-09 – 2023-05-06 (×5): qty 2, 28d supply, fill #0
  Filled 2023-05-24: qty 2, 28d supply, fill #1

## 2023-04-09 MED ORDER — GLOBAL EASE INJECT PEN NEEDLES 32G X 4 MM MISC
1.0000 | Freq: Four times a day (QID) | 3 refills | Status: DC
  Filled 2023-04-09: qty 400, 100d supply, fill #0
  Filled 2023-05-24 – 2023-06-03 (×3): qty 400, 100d supply, fill #1

## 2023-04-09 MED ORDER — NOVOLOG FLEXPEN 100 UNIT/ML ~~LOC~~ SOPN
16.0000 [IU] | PEN_INJECTOR | Freq: Three times a day (TID) | SUBCUTANEOUS | 3 refills | Status: DC
  Filled 2023-04-09: qty 12, 27d supply, fill #0
  Filled 2023-05-24: qty 12, 27d supply, fill #1

## 2023-04-09 NOTE — Telephone Encounter (Signed)
Medication Samples have been provided to the patient.  Drug name: Greggory Keen        Strength: 2.5mg        Qty: 1 box   LOT: [id number]  Exp.Date: 11/01/24  Dosing instructions: inject once weekly   The patient has been instructed regarding the correct time, dose, and frequency of taking this medication, including desired effects and most common side effects.   Asianna Brundage L Nahum Sherrer 3:11 PM 04/09/2023

## 2023-04-09 NOTE — Progress Notes (Signed)
Name: Kathleen [facility]  Age/ Sex: 68 y.o., female   MRN/ DOB: [id number], 08/03/1955     PCP: Kathleen Nam, MD   Reason for Endocrinology Evaluation: Type 2 Diabetes Mellitus  Initial Endocrine Consultative Visit: 05/14/2021    [place] IDENTIFIER: Kathleen [place]field is a 68 y.o. female with a past medical history of DM, CHF, COPS. OSA on CPAP, GERD. Kathleen [facility] has followed with Endocrinology clinic since 05/14/2021 for consultative assistance with management of her diabetes.  DIABETIC HISTORY:  Kathleen [facility]field was diagnosed with DM 2017, and started insulin therapyin 2000. Her hemoglobin A1c has ranged from 7.4% in 2017, peaking at 11.5% in 2022.   Kathleen [facility]field was seen by [facility] once 05/2021  Ozempic discontinued by PCP due to nausea and abdominal 02/2023  SUBJECTIVE:   During Kathleen last visit (10/06/2022): A1c 12.9%     Today (04/09/2023): Kathleen [facility] is here for a follow up on diabetes management..  Kathleen [facility] checks her  glucose through dexcom G7.  Kathleen [place] has no hypoglycemia.  Kathleen [facility]field has chronic UTIs- sees urology on chronic Abx  Kathleen [facility] presented to [facility] ED 12/2022 with shortness of breath and hyperglycemia, BG 550 Mg/DL Kathleen morning of her presentation  Continues with nausea and abdominal pain , despite being off Ozempic for a month   Denies vomiting  Has occasional changed in bowel movement  Kathleen [facility] eats Breakfast and Supper  Nibbles during Kathleen day    HOME DIABETES REGIMEN:  Ozempic 2 mg weekly - not taking  Tresiba 120 units - takes 102 units  Novolog (BG-130/20)    Statin: on repatha ACE-I/ARB: no   CONTINUOUS GLUCOSE MONITORING RECORD INTERPRETATION    Dates of Recording: 1/18-1/31/2025  Sensor description:dexcom  Results statistics:   CGM use % of time 93  Average and SD 266/57  Time in range 3  %  % Time Above 180 44  % Time above 250 53  % Time Below target 0   Glycemic patterns summary: Hyperglycemia noted during Kathleen day and Kathleen night  Hyperglycemic episodes [place] day and night but  worse postprandial  Hypoglycemic episodes occurred N/A  Overnight periods: High   DIABETIC COMPLICATIONS: Microvascular complications:  Neuropathy,  Denies:  Last Eye Exam: Completed 12/2019  Macrovascular complications:  CAD Denies:  CVA, PVD   HISTORY:  Past Medical History:  Past Medical History:  Diagnosis Date   Anemia    Arthritis    Asthma    Chronic diastolic CHF 05/2014   Echocardiogram 05/2019: EF 70, no RWMA, mild LVH, Gr 1 DD, normal RVSF, severe LVH, borderline asc Aorta (39 mm)   CKD (chronic kidney disease)    Depression    Diabetes mellitus without complication (HCC)    Diverticulitis    Dyspnea    with exertion   GERD (gastroesophageal reflux disease)    History of blood transfusion    Hyperlipidemia    cannot tolerate statins   Hypertension    [place] states Kathleen [facility] has never had high blood pressure.    Nuclear stress test    Myoview 05/2019: EF 83, no ischemia or infarction; Low Risk   Peripheral neuropathy    Pneumonia    PONV (postoperative nausea and vomiting)    RLS (restless legs syndrome)    Sleep apnea    uses CPAP   Ulcerative colitis (HCC)    dr Kathleen [facility]   Past Surgical History:  Past Surgical History:  Procedure Laterality Date   ABDOMINAL HYSTERECTOMY  APPENDECTOMY     BIOPSY  10/10/2021   Procedure: BIOPSY;  Surgeon: Kathleen Hawking, MD;  Location: [facility];  Service: Gastroenterology;;   BREAST BIOPSY Left    BREAST SURGERY     left biopsy   CARDIAC CATHETERIZATION     CESAREAN SECTION     CHOLECYSTECTOMY     COLONOSCOPY WITH PROPOFOL N/A 10/10/2021   Procedure: COLONOSCOPY WITH PROPOFOL;  Surgeon: [facility] Hawking, MD;  Location: [facility];  Service: Gastroenterology;  Laterality: N/A;   HERNIA REPAIR     INSERTION OF MESH N/A 11/15/2015   Procedure: INSERTION OF MESH;  Surgeon: [facility] Soda, MD;  Location: [facility];  Service: General;  Laterality: N/A;   LAPAROSCOPIC LYSIS OF ADHESIONS N/A 11/15/2015   Procedure:  LAPAROSCOPIC LYSIS OF ADHESIONS;  Surgeon: [facility] Soda, MD;  Location: [facility];  Service: General;  Laterality: N/A;   RIGHT HEART CATHETERIZATION N/A 02/27/2013   Procedure: RIGHT HEART CATH;  Surgeon: [facility] Chapman, MD;  Location: [facility];  Service: Cardiovascular;  Laterality: N/A;   RIGHT HEART CATHETERIZATION N/A 12/14/2013   Procedure: RIGHT HEART CATH;  Surgeon: [facility] Morale, MD;  Location: [facility];  Service: Cardiovascular;  Laterality: N/A;   SIGMOIDECTOMY  2010   diverticular disease   VENTRAL HERNIA REPAIR N/A 11/15/2015   Procedure: LAPAROSCOPIC VENTRAL WALL HERNIA REPAIR;  Surgeon: [facility] Soda, MD;  Location: [facility];  Service: General;  Laterality: N/A;   Social History:  reports that Kathleen [facility] has never smoked. Kathleen [facility] has been exposed to tobacco smoke. Kathleen [facility] has never used smokeless tobacco. Kathleen [facility] reports that Kathleen [facility] does not drink alcohol and does not use drugs. Family History:  Family History  Problem Relation Age of Onset   Heart disease Mother    Hypertension Mother    Dementia Mother    Heart disease Father    COPD Father    Hypertension Father    Heart disease Brother    Other Brother        knee replacement; hip replacement   Other Brother        cant brathe when laying down   Diabetes Maternal Grandmother    Breast cancer Neg Hx    Colon cancer Neg Hx      HOME MEDICATIONS: Allergies as of 04/09/2023       Reactions   Almond Oil Anaphylaxis, Shortness Of Breath, Swelling   Morphine And Codeine Shortness Of Breath, Other (See Comments)   Pt. States while in [facility] hospital it affected her breathing, O2 dropped to Kathleen 70's   Primidone Shortness Of Breath       Atorvastatin Other (See Comments)   Leg weakness   Ceclor [cefaclor] Nausea And Vomiting   Sulfa Antibiotics Nausea And Vomiting   Ciprofloxacin Other (See Comments)   Makes joints and muscles ache   Levaquin [levofloxacin] Other (See Comments)   Body aches   Losartan Other (See Comments)    Weakness   Peanut-containing Drug Products    Pt states Kathleen [facility] thinks it is causing gout flare-ups   Repatha [evolocumab]    Aches   Statins Other (See Comments)   Leg and body weakness        Medication List        Accurate as of April 09, 2023  2:26 PM. If you have any questions, ask your nurse or doctor.          Abrysvo 120 MCG/0.5ML injection Generic drug: RSV bivalent vaccine  albuterol 108 (90 Base) MCG/ACT inhaler Commonly known as: VENTOLIN HFA Inhale 2 puffs into Kathleen lungs 2 (two) to 3 (three) times daily as needed for shortness of breath/wheeze.   arformoterol 15 MCG/2ML Nebu Commonly known as: BROVANA Take 2 mLs (15 mcg total) by nebulization 2 (two) times daily.   aspirin EC 81 MG tablet Take 1 tablet (81 mg total) by mouth daily. Swallow whole.   benzonatate 200 MG capsule Commonly known as: TESSALON Take 1 capsule (200 mg total) by mouth 3 (three) times daily as needed.   budesonide 0.5 MG/2ML nebulizer solution Commonly known as: Pulmicort Take 2 mLs (0.5 mg total) by nebulization 2 (two) times daily.   buPROPion 300 MG 24 hr tablet Commonly known as: WELLBUTRIN XL Take 1 tablet (300 mg total) by mouth in Kathleen morning.   colchicine 0.6 MG tablet Take 1 tablet (0.6 mg total) by mouth daily as needed for gout.   Dexcom G7 Sensor Misc 1 Device by Does not apply route as directed. Apply sensor every 10 days   dicyclomine 20 MG tablet Commonly known as: BENTYL Take 1 tablet (20 mg total) by mouth 4 (four) times daily as needed for GI cramping, pain, nausea/vomiting.   DULoxetine 60 MG capsule Commonly known as: CYMBALTA Take 1 capsule (60 mg total) by mouth at bedtime.   estradiol 0.1 MG/GM vaginal cream Commonly known as: ESTRACE Place 1 Applicatorful vaginally 3 (three) times a week.   FeroSul 325 (65 Fe) MG tablet Generic drug: ferrous sulfate Take 1 tablet (325 mg total) by mouth every other day.   fluticasone 50 MCG/ACT nasal  spray Commonly known as: FLONASE Place 2 sprays into both nostrils as needed.   Fluzone High-Dose 0.5 ML injection Generic drug: Influenza vac split quadrivalent PF   furosemide 20 MG tablet Commonly known as: LASIX Take 3 tablets (60 mg total) by mouth 2 (two) times daily.   gabapentin 300 MG capsule Commonly known as: NEURONTIN Take 1 capsule (300 mg total) by mouth 2 (two) times daily.   Global Ease Inject Pen Needles 32G X 4 MM Misc Generic drug: Insulin Pen Needle 1 Device by Other route in Kathleen morning, at noon, in Kathleen evening, and at bedtime.   ipratropium-albuterol 0.5-2.5 (3) MG/3ML Soln Commonly known as: DUONEB Take 3 mLs by nebulization every 6 (six) hours as needed (wheezing).   ketoconazole 2 % cream Commonly known as: NIZORAL Apply 1 Application topically 2 (two) times daily.   mesalamine 1.2 g EC tablet Commonly known as: LIALDA Take 2 tablets (2.4 g total) by mouth 2 (two) times daily.   metolazone 5 MG tablet Commonly known as: ZAROXOLYN Take 1 tablet (5 mg total) by mouth daily as needed.   nitrofurantoin (macrocrystal-monohydrate) 100 MG capsule Commonly known as: Macrobid Take 1 capsule (100 mg total) by mouth at bedtime.   nitrofurantoin 100 MG capsule Commonly known as: MACRODANTIN Take 1 capsule (100 mg total) by mouth daily.   NovoLOG FlexPen 100 UNIT/ML FlexPen Generic drug: insulin aspart Inject 1-8 Units into Kathleen skin See admin instructions. Per sliding scale.  Max daily 45 units   ondansetron 4 MG tablet Commonly known as: ZOFRAN Take 1 tablet (4 mg total) by mouth every 8 (eight) hours as needed for nausea and vomiting   optichamber diamond Misc Use as dirtected with inhaler   OXYGEN Inhale 2 L into Kathleen lungs continuous.   pantoprazole 40 MG tablet Commonly known as: PROTONIX Take 1 tablet (40 mg total)  by mouth every morning.   potassium chloride 10 MEQ CR capsule Commonly known as: MICRO-K Take 2 capsules (20 mEq total) by  mouth in Kathleen morning AND 1 capsule (10 mEq total) daily at 12 noon AND 2 capsules (20 mEq total) every evening.   Praluent 150 MG/ML Soaj Generic drug: Alirocumab Inject 1 mL (150 mg total) into Kathleen skin every 14 (fourteen) days.   rOPINIRole 4 MG tablet Commonly known as: REQUIP Take 1 tablet (4 mg total) by mouth at bedtime.   spironolactone 25 MG tablet Commonly known as: ALDACTONE Take 0.5 tablets (12.5 mg total) by mouth daily.   traMADol 50 MG tablet Commonly known as: ULTRAM Take 1 tablet (50 mg total) by mouth 3 (three) times daily as needed.   Evaristo Bury FlexTouch 200 UNIT/ML FlexTouch Pen Generic drug: insulin degludec Inject 80 Units into Kathleen skin daily. PLEASE CONTACT [facility] FOR AN APPOINTMENT.  CALL [phone] OR USE MYCHART. What changed: how much to take   triamcinolone cream 0.1 % Commonly known as: KENALOG Apply 1 Application topically 2 (two) times daily as needed.   UNABLE TO FIND CPAP- At bedtime   Vitamin D-3 125 MCG (5000 UT) Tabs Take 5,000 Units by mouth every other day.         OBJECTIVE:   Vital Signs: BP 134/82 (BP Location: Left Arm, [place] Position: Sitting, Cuff Size: Normal)   Pulse 88   Ht 5\' 5"  (1.651 m)   Wt 255 lb (115.7 kg)   SpO2 97%   BMI 42.43 kg/m   Wt Readings from Last 3 Encounters:  04/09/23 255 lb (115.7 kg)  01/28/23 253 lb 9.6 oz (115 kg)  01/21/23 248 lb (112.5 kg)     Exam: General: Pt appears well and is in NAD In a wheel chair   Lungs: Clear with good BS bilat   Heart: RRR   Extremities: No pretibial edema.   Neuro: MS is good with appropriate affect, pt is alert and Ox3   DM Foot Exam 04/09/2023   Kathleen skin of Kathleen feet is intact without sores or ulcerations. Kathleen pedal pulses are 2+ on right and 2+ on left. Kathleen sensation is decreased to a screening 5.07, 10 gram monofilament bilaterally   DATA REVIEWED:  Lab Results  Component Value Date   HGBA1C 10.3 (H) 03/18/2023   HGBA1C 11.2  (H) 11/26/2022   HGBA1C 12.9 (A) 10/06/2022    Latest Reference Range & Units 03/18/23 09:51  Sodium 135 - 145 mEq/L 137  Potassium 3.5 - 5.1 mEq/L 3.1 (L)  Chloride 96 - 112 mEq/L 86 (L)  CO2 19 - 32 mEq/L 40 (H)  Glucose 70 - 99 mg/dL 725 (H)  BUN 6 - 23 mg/dL 32 (H)  Creatinine 3.66 - 1.20 mg/dL 4.40 (H)  Calcium 8.4 - 10.5 mg/dL 34.7  Alkaline Phosphatase 39 - 117 U/L 75  Albumin 3.5 - 5.2 g/dL 4.2  Lipase 42.5 - 95.6 U/L 11.0  AST 0 - 37 U/L 30  ALT 0 - 35 U/L 31  Total Protein 6.0 - 8.3 g/dL 7.1  Total Bilirubin 0.2 - 1.2 mg/dL 0.5  GFR >38.75 mL/min 42.75 (L)  (L): Data is abnormally low (H): Data is abnormally high  ASSESSMENT / PLAN / RECOMMENDATIONS:   1) Type 2 Diabetes Mellitus, Poorly controlled, With CKD III and macrovascular complications - Most recent A1c of 10.3 %. Goal A1c < 7.0 %.     -A1c is trending down  but continues to be above goal -K[facility]leen [facility] continues to imperfect adherence to medication intake -I have advised Kathleen [facility] to leave Kathleen [place] at Kathleen nightstand and Kathleen [facility] will use it at bedtime -Kathleen [facility] did not do well with a correction scale, so [facility] will be provided with a set dose of NovoLog to take before breakfast and supper.  Kathleen [facility] does not eat lunch but Kathleen [facility] will do her nibble -I have advised [facility] to keep her NovoLog at Kathleen table where Kathleen [facility] would normally eat to remember to take it before meals -Kathleen [facility] is intolerant to Ozempic, her GI issues have not resolved despite discontinuing Ozempic for a month.  I have recommended trying Mounjaro small dose, which Kathleen [facility] is in agreement of -I will increase her Evaristo Bury as below - Not a candidate for SGLT-2i due to chronic UTI's  - Limited glycemic agents due to CKD   MEDICATIONS: Start Mounjaro 2.5 mg weekly Take tresiba 110 units daily  Novolog 16 units before breakfast and supper  EDUCATION / INSTRUCTIONS: BG monitoring instructions: [facility] is instructed to check her blood sugars 3 times a day, before each meal  . Call [facility] if: BG persistently < 70  I reviewed Kathleen Rule of 15 for Kathleen treatment of hypoglycemia in detail with Kathleen [facility]. Literature supplied.    2) Diabetic complications:  Eye: Does not have known diabetic retinopathy.  Neuro/ Feet: Does not have known diabetic peripheral neuropathy .  Renal: [facility] does  have known baseline CKD. Kathleen [facility]   is not on an ACEI/ARB at present. Kathleen [facility] had endorses weakness with losartan       F/U in 3 months   I spent 25 minutes preparing to see Kathleen [facility] by review of recent labs, imaging and procedures, obtaining and reviewing separately obtained history, communicating with Kathleen [facility]/family or caregiver, ordering medications, tests or procedures, and documenting clinical information in Kathleen EHR including Kathleen differential Dx, treatment, and any further evaluation and other management    Signed electronically by: Lyndle Herrlich, MD  [facility]  [facility] [address] [place] [place], [place] [address] Phone: [phone] FAX: [phone]   CC: [facility] Nam, MD [address] [place] [place] [facility] [place] [address] Phone: [phone]  Fax: [phone]  Return to Endocrinology clinic as below: Future Appointments  Date Time Provider Department Center  04/14/2023  2:00 PM Tomma Lightning, MD [facility] None  07/13/2023  2:30 PM Marcos Eke, PA-C [facility] None  07/15/2023 10:50 AM [facility] ANNUAL WELLNESS VISIT 1 [facility]

## 2023-04-09 NOTE — Patient Instructions (Addendum)
Start Mounjaro 2.5 mg weekly  Take Tresiba 110 units DAILY  Novolog 16 units with breakfast and Supper   HOW TO TREAT LOW BLOOD SUGARS (Blood sugar LESS THAN 70 MG/DL) Please follow the RULE OF 15 for the treatment of hypoglycemia treatment (when your (blood sugars are less than 70 mg/dL)   STEP 1: Take 15 grams of carbohydrates when your blood sugar is low, which includes:  3-4 GLUCOSE TABS  OR 3-4 OZ OF JUICE OR REGULAR SODA OR ONE TUBE OF GLUCOSE GEL    STEP 2: RECHECK blood sugar in 15 MINUTES STEP 3: If your blood sugar is still low at the 15 minute recheck --> then, go back to STEP 1 and treat AGAIN with another 15 grams of carbohydrates.

## 2023-04-12 ENCOUNTER — Other Ambulatory Visit (HOSPITAL_COMMUNITY): Payer: Self-pay

## 2023-04-12 ENCOUNTER — Encounter: Payer: Self-pay | Admitting: Internal Medicine

## 2023-04-12 ENCOUNTER — Other Ambulatory Visit: Payer: Self-pay

## 2023-04-13 ENCOUNTER — Other Ambulatory Visit: Payer: Self-pay

## 2023-04-13 ENCOUNTER — Encounter: Payer: Self-pay | Admitting: Internal Medicine

## 2023-04-13 ENCOUNTER — Other Ambulatory Visit (HOSPITAL_COMMUNITY): Payer: Self-pay

## 2023-04-14 ENCOUNTER — Ambulatory Visit: Payer: Medicare HMO | Admitting: Pulmonary Disease

## 2023-04-14 ENCOUNTER — Other Ambulatory Visit (HOSPITAL_COMMUNITY): Payer: Self-pay

## 2023-04-14 ENCOUNTER — Other Ambulatory Visit: Payer: Self-pay

## 2023-04-14 ENCOUNTER — Encounter: Payer: Self-pay | Admitting: Pulmonary Disease

## 2023-04-14 VITALS — BP 116/80 | HR 91 | Temp 98.3°F | Ht 65.0 in | Wt 255.8 lb

## 2023-04-14 DIAGNOSIS — G4733 Obstructive sleep apnea (adult) (pediatric): Secondary | ICD-10-CM

## 2023-04-14 DIAGNOSIS — J9611 Chronic respiratory failure with hypoxia: Secondary | ICD-10-CM

## 2023-04-14 MED ORDER — DOXYCYCLINE HYCLATE 100 MG PO TABS
100.0000 mg | ORAL_TABLET | Freq: Two times a day (BID) | ORAL | 0 refills | Status: DC
  Filled 2023-04-14: qty 20, 10d supply, fill #0

## 2023-04-14 MED ORDER — PREDNISONE 20 MG PO TABS
20.0000 mg | ORAL_TABLET | Freq: Every day | ORAL | 0 refills | Status: DC
  Filled 2023-04-14: qty 7, 7d supply, fill #0

## 2023-04-14 NOTE — Progress Notes (Signed)
 Kathleen Hatfield    [id number]    Oct 21, 1955  Primary Care Physician:Duncan, Arlyss RAMAN, MD  Referring Physician: Cleatus Arlyss RAMAN, MD [address] [place],  [place] [address]  Chief complaint:   Patient being seen Chronic shortness of breath, chronic respiratory failure on oxygen  supplementation Chronic asthma  HPI:  Patient with obstructive lung disease with FEV1 of 35%, chronic history of asthma, exposed to a lot of secondhand smoke  Shortness of breath with activity  Limited with activities of daily living with bad knees and bad ankles Only able to walk about 40-50 steps  Does have a history of obstructive sleep apnea for which he uses CPAP on a nightly basis  Has been coughing with minimal phlegm production No fevers, no chills  Denies any chest pains or chest discomfort  Sleep habits are erratic, will sleep for about 3 to 4 hours but has no scheduled routines More comfortable sleeping in a recliner, staying more upright  Outpatient Encounter Medications as of 04/14/2023  Medication Sig   ABRYSVO 120 MCG/0.5ML injection    albuterol  (VENTOLIN  HFA) 108 (90 Base) MCG/ACT inhaler Inhale 2 puffs into the lungs 2 (two) to 3 (three) times daily as needed for shortness of breath/wheeze.   Alirocumab  (PRALUENT ) 150 MG/ML SOAJ Inject 1 mL (150 mg total) into the skin every 14 (fourteen) days.   arformoterol  (BROVANA ) 15 MCG/2ML NEBU Take 2 mLs (15 mcg total) by nebulization 2 (two) times daily.   aspirin  EC 81 MG tablet Take 1 tablet (81 mg total) by mouth daily. Swallow whole.   benzonatate  (TESSALON ) 200 MG capsule Take 1 capsule (200 mg total) by mouth 3 (three) times daily as needed.   budesonide  (PULMICORT ) 0.5 MG/2ML nebulizer solution Take 2 mLs (0.5 mg total) by nebulization 2 (two) times daily.   buPROPion  (WELLBUTRIN  XL) 300 MG 24 hr tablet Take 1 tablet (300 mg total) by mouth in the morning.   Cholecalciferol  (VITAMIN D -3) 5000 UNITS  TABS Take 5,000 Units by mouth every other day.    colchicine  0.6 MG tablet Take 1 tablet (0.6 mg total) by mouth daily as needed for gout.   Continuous Blood Gluc Sensor (DEXCOM G7 SENSOR) MISC 1 Device by Does not apply route as directed. Apply sensor every 10 days   dicyclomine  (BENTYL ) 20 MG tablet Take 1 tablet (20 mg total) by mouth 4 (four) times daily as needed for GI cramping, pain, nausea/vomiting.   DULoxetine  (CYMBALTA ) 60 MG capsule Take 1 capsule (60 mg total) by mouth at bedtime.   estradiol  (ESTRACE ) 0.1 MG/GM vaginal cream Place 1 Applicatorful vaginally 3 (three) times a week.   ferrous sulfate  325 (65 FE) MG tablet Take 1 tablet (325 mg total) by mouth every other day.   fluticasone  (FLONASE ) 50 MCG/ACT nasal spray Place 2 sprays into both nostrils as needed.   FLUZONE HIGH-DOSE 0.5 ML injection    furosemide  (LASIX ) 20 MG tablet Take 3 tablets (60 mg total) by mouth 2 (two) times daily.   gabapentin  (NEURONTIN ) 300 MG capsule Take 1 capsule (300 mg total) by mouth 2 (two) times daily.   insulin  aspart (NOVOLOG  FLEXPEN) 100 UNIT/ML FlexPen Inject 16 Units into the skin 3 (three) times daily with meals. Per sliding scale.  Max daily 45 units   insulin  degludec (TRESIBA  FLEXTOUCH) 200 UNIT/ML FlexTouch Pen Inject 110 Units into the skin daily.   Insulin  Pen Needle (GLOBAL EASE  INJECT PEN NEEDLES) 32G X 4 MM MISC Use for insulin  in the morning, at noon, in the evening, and at bedtime.   ipratropium-albuterol  (DUONEB) 0.5-2.5 (3) MG/3ML SOLN Take 3 mLs by nebulization every 6 (six) hours as needed (wheezing).   ketoconazole  (NIZORAL ) 2 % cream Apply 1 Application topically 2 (two) times daily.   mesalamine  (LIALDA ) 1.2 g EC tablet Take 2 tablets (2.4 g total) by mouth 2 (two) times daily.   metolazone  (ZAROXOLYN ) 5 MG tablet Take 1 tablet (5 mg total) by mouth daily as needed.   nitrofurantoin  (MACRODANTIN ) 100 MG capsule Take 1 capsule (100 mg total) by mouth daily.    nitrofurantoin , macrocrystal-monohydrate, (MACROBID ) 100 MG capsule Take 1 capsule (100 mg total) by mouth at bedtime.   ondansetron  (ZOFRAN ) 4 MG tablet Take 1 tablet (4 mg total) by mouth every 8 (eight) hours as needed for nausea and vomiting   OXYGEN  Inhale 2 L into the lungs continuous.   pantoprazole  (PROTONIX ) 40 MG tablet Take 1 tablet (40 mg total) by mouth every morning.   potassium chloride  (MICRO-K ) 10 MEQ CR capsule Take 2 capsules (20 mEq total) by mouth in the morning AND 1 capsule (10 mEq total) daily at 12 noon AND 2 capsules (20 mEq total) every evening.   rOPINIRole  (REQUIP ) 4 MG tablet Take 1 tablet (4 mg total) by mouth at bedtime.   Spacer/Aero-Holding Chambers (OPTICHAMBER DIAMOND ) MISC Use as dirtected with inhaler   spironolactone  (ALDACTONE ) 25 MG tablet Take 0.5 tablets (12.5 mg total) by mouth daily.   tirzepatide  (MOUNJARO ) 2.5 MG/0.5ML Pen Inject 2.5 mg into the skin once a week.   traMADol  (ULTRAM ) 50 MG tablet Take 1 tablet (50 mg total) by mouth 3 (three) times daily as needed.   triamcinolone  cream (KENALOG ) 0.1 % Apply 1 Application topically 2 (two) times daily as needed.   UNABLE TO FIND CPAP- At bedtime   doxycycline  (VIBRA -TABS) 100 MG tablet Take 1 tablet (100 mg total) by mouth 2 (two) times daily.   predniSONE  (DELTASONE ) 20 MG tablet Take 1 tablet (20 mg total) by mouth daily with breakfast.   No facility-administered encounter medications on file as of 04/14/2023.    Allergies as of 04/14/2023 - Review Complete 04/14/2023  Allergen Reaction Noted   Almond oil Anaphylaxis, Shortness Of Breath, and Swelling 02/24/2013   Morphine  and codeine  Shortness Of Breath and Other (See Comments) 12/11/2015   Primidone  Shortness Of Breath 06/26/2022   Atorvastatin Other (See Comments) 11/17/2017   Ceclor [cefaclor] Nausea And Vomiting 05/25/2011   Sulfa  antibiotics Nausea And Vomiting 05/25/2011   Ciprofloxacin  Other (See Comments) 09/16/2015   Levaquin   [levofloxacin ] Other (See Comments) 02/24/2013   Losartan  Other (See Comments) 11/17/2017   Peanut-containing drug products  07/14/2022   Repatha  [evolocumab ]  06/26/2022   Statins Other (See Comments) 05/18/2018    Past Medical History:  Diagnosis Date   Anemia    Arthritis    Asthma    Chronic diastolic CHF 05/2014   Echocardiogram 05/2019: EF 70, no RWMA, mild LVH, Gr 1 DD, normal RVSF, severe LVH, borderline asc Aorta (39 mm)   CKD (chronic kidney disease)    Depression    Diabetes mellitus without complication (HCC)    Diverticulitis    Dyspnea    with exertion   GERD (gastroesophageal reflux disease)    History of blood transfusion    Hyperlipidemia    cannot tolerate statins   Hypertension    patient states she has  never had high blood pressure.    Nuclear stress test    Myoview  05/2019: EF 83, no ischemia or infarction; Low Risk   Peripheral neuropathy    Pneumonia    PONV (postoperative nausea and vomiting)    RLS (restless legs syndrome)    Sleep apnea    uses CPAP   Ulcerative colitis (HCC)    dr kristie    Past Surgical History:  Procedure Laterality Date   ABDOMINAL HYSTERECTOMY     APPENDECTOMY     BIOPSY  10/10/2021   Procedure: BIOPSY;  Surgeon: Rollin Dover, MD;  Location: [facility];  Service: Gastroenterology;;   BREAST BIOPSY Left    BREAST SURGERY     left biopsy   CARDIAC CATHETERIZATION     CESAREAN SECTION     CHOLECYSTECTOMY     COLONOSCOPY WITH PROPOFOL  N/A 10/10/2021   Procedure: COLONOSCOPY WITH PROPOFOL ;  Surgeon: Rollin Dover, MD;  Location: [facility];  Service: Gastroenterology;  Laterality: N/A;   HERNIA REPAIR     INSERTION OF MESH N/A 11/15/2015   Procedure: INSERTION OF MESH;  Surgeon: Sheldon Standing, MD;  Location: [facility];  Service: General;  Laterality: N/A;   LAPAROSCOPIC LYSIS OF ADHESIONS N/A 11/15/2015   Procedure: LAPAROSCOPIC LYSIS OF ADHESIONS;  Surgeon: Sheldon Standing, MD;  Location: [facility];  Service: General;   Laterality: N/A;   RIGHT HEART CATHETERIZATION N/A 02/27/2013   Procedure: RIGHT HEART CATH;  Surgeon: Ozell JONETTA Fell, MD;  Location: [facility] CATH LAB;  Service: Cardiovascular;  Laterality: N/A;   RIGHT HEART CATHETERIZATION N/A 12/14/2013   Procedure: RIGHT HEART CATH;  Surgeon: Ezra GORMAN Shuck, MD;  Location: [facility] CATH LAB;  Service: Cardiovascular;  Laterality: N/A;   SIGMOIDECTOMY  2010   diverticular disease   VENTRAL HERNIA REPAIR N/A 11/15/2015   Procedure: LAPAROSCOPIC VENTRAL WALL HERNIA REPAIR;  Surgeon: Sheldon Standing, MD;  Location: [facility];  Service: General;  Laterality: N/A;    Family History  Problem Relation Age of Onset   Heart disease Mother    Hypertension Mother    Dementia Mother    Heart disease Father    COPD Father    Hypertension Father    Heart disease Brother    Other Brother        knee replacement; hip replacement   Other Brother        cant brathe when laying down   Diabetes Maternal Grandmother    Breast cancer Neg Hx    Colon cancer Neg Hx     Social History   Socioeconomic History   Marital status: Divorced    Spouse name: Not on file   Number of children: Not on file   Years of education: Not on file   Highest education level: Not on file  Occupational History   Occupation: unemployed  Tobacco Use   Smoking status: Never    Passive exposure: Past   Smokeless tobacco: Never  Vaping Use   Vaping status: Never Used  Substance and Sexual Activity   Alcohol use: No   Drug use: No   Sexual activity: Not on file  Other Topics Concern   Not on file  Social History Narrative   Live with spouse   Right handed   Drinks caffiene prn   One floor home   retired   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[employer] Strain: Low Risk  (07/14/2022)   Overall Financial Resource Strain (CARDIA)    Difficulty of Paying  Living Expenses: Not hard at all  Food Insecurity: No Food Insecurity (12/03/2022)   Hunger Vital Sign    Worried About Running Out of  Food in the Last Year: Never true    Ran Out of Food in the Last Year: Never true  Transportation Needs: No Transportation Needs (11/24/2022)   PRAPARE - Administrator, Civil Service (Medical): No    Lack of Transportation (Non-Medical): No  Physical Activity: Insufficiently Active (07/14/2022)   Exercise Vital Sign    Days of Exercise per Week: 7 days    Minutes of Exercise per Session: 10 min  Stress: No Stress Concern Present (07/14/2022)   [employer] of Occupational Health - Occupational Stress Questionnaire    Feeling of Stress : Not at all  Social Connections: Socially Isolated (07/14/2022)   Social Connection and Isolation Panel [NHANES]    Frequency of Communication with Friends and Family: Never    Frequency of Social Gatherings with Friends and Family: Never    Attends Religious Services: Never    Database Administrator or Organizations: No    Attends Banker Meetings: Never    Marital Status: Divorced  Catering Manager Violence: Not At Risk (11/24/2022)   Humiliation, Afraid, Rape, and Kick questionnaire    Fear of Current or Ex-Partner: No    Emotionally Abused: No    Physically Abused: No    Sexually Abused: No    Review of Systems  Respiratory:  Positive for cough and shortness of breath.   Musculoskeletal:  Positive for arthralgias.  Psychiatric/Behavioral:  Positive for sleep disturbance.     Vitals:   04/14/23 1419  BP: 116/80  Pulse: 91  Temp: 98.3 F (36.8 C)  SpO2: 93%     Physical Exam Constitutional:      Appearance: She is obese.  HENT:     Head: Normocephalic.     Nose: Nose normal.     Mouth/Throat:     Mouth: Mucous membranes are moist.  Eyes:     General: No scleral icterus.    Pupils: Pupils are equal, round, and reactive to light.  Cardiovascular:     Rate and Rhythm: Normal rate and regular rhythm.     Heart sounds: No murmur heard.    No friction rub.  Pulmonary:     Effort: No respiratory distress.      Breath sounds: No stridor. No wheezing or rhonchi.  Musculoskeletal:     Cervical back: No rigidity.  Neurological:     Mental Status: She is alert.  Psychiatric:        Mood and Affect: Mood normal.    Data Reviewed: CPAP compliance reviewed showing excellent compliance with CPAP of 97% Average use of 5 hours 30 minutes CPAP of 13 Residual AHI of 0.2  Assessment:  Obstructive sleep apnea -Continue using CPAP  Stage III COPD -Does have significant bronchodilator response on last PFT -Continue Brovana , Pulmicort , DuoNeb as needed  Chronic hypoxemic respiratory failure -Continue oxygen  supplementation  Deconditioning  Class III obesity  Significant musculoskeletal limitations  Plan/Recommendations: Graded activities as tolerated  Continue nebulization treatments  Continue CPAP use on a nightly basis  Follow-up in about 3 months  Encouraged to call with significant concerns  I spent 30 minutes dedicated to the care of this patient on the date of this encounter to include previsit review of records, face-to-face time with the patient discussing conditions above, post visit ordering of testing,ordering medications,independentlyinterpreting results, clinical documentation  with electronic health record and communicated necessary findings to members of the patient's care team   Jennet Epley MD [facility] 04/14/2023, 2:40 PM  CC: Cleatus Arlyss RAMAN, MD

## 2023-04-14 NOTE — Patient Instructions (Signed)
 Continue using your nebulization treatments with Brovana , Pulmicort  DuoNeb as needed  Continue using your CPAP  Will send in a prescription for antibiotic and steroid  Follow-up in 3 months  Call with significant concerns

## 2023-04-15 ENCOUNTER — Other Ambulatory Visit: Payer: Self-pay

## 2023-04-15 ENCOUNTER — Encounter (HOSPITAL_COMMUNITY): Payer: Self-pay

## 2023-04-15 ENCOUNTER — Other Ambulatory Visit (HOSPITAL_COMMUNITY): Payer: Self-pay

## 2023-04-16 ENCOUNTER — Other Ambulatory Visit: Payer: Self-pay

## 2023-04-16 ENCOUNTER — Other Ambulatory Visit (HOSPITAL_COMMUNITY): Payer: Self-pay

## 2023-04-19 ENCOUNTER — Other Ambulatory Visit: Payer: Self-pay

## 2023-04-20 ENCOUNTER — Other Ambulatory Visit (HOSPITAL_COMMUNITY): Payer: Self-pay

## 2023-04-22 ENCOUNTER — Other Ambulatory Visit: Payer: Self-pay

## 2023-04-22 DIAGNOSIS — I10 Essential (primary) hypertension: Secondary | ICD-10-CM | POA: Diagnosis not present

## 2023-04-22 DIAGNOSIS — G4733 Obstructive sleep apnea (adult) (pediatric): Secondary | ICD-10-CM | POA: Diagnosis not present

## 2023-04-22 DIAGNOSIS — J449 Chronic obstructive pulmonary disease, unspecified: Secondary | ICD-10-CM | POA: Diagnosis not present

## 2023-04-23 ENCOUNTER — Other Ambulatory Visit (HOSPITAL_COMMUNITY): Payer: Self-pay

## 2023-04-23 ENCOUNTER — Other Ambulatory Visit: Payer: Self-pay

## 2023-04-26 ENCOUNTER — Other Ambulatory Visit (HOSPITAL_COMMUNITY): Payer: Self-pay

## 2023-04-26 ENCOUNTER — Encounter (HOSPITAL_COMMUNITY): Payer: Self-pay

## 2023-04-27 ENCOUNTER — Other Ambulatory Visit (HOSPITAL_COMMUNITY): Payer: Self-pay

## 2023-04-30 ENCOUNTER — Other Ambulatory Visit (HOSPITAL_COMMUNITY): Payer: Self-pay

## 2023-04-30 ENCOUNTER — Other Ambulatory Visit: Payer: Self-pay

## 2023-04-30 ENCOUNTER — Other Ambulatory Visit: Payer: Self-pay | Admitting: Family Medicine

## 2023-05-03 ENCOUNTER — Telehealth: Payer: Self-pay | Admitting: Pulmonary Disease

## 2023-05-03 NOTE — Telephone Encounter (Signed)
 Kathleen Hatfield would like to know if Mikael Spray has been discontinued. Patient last time filled 04/2022. [place] phone number is [phone].

## 2023-05-04 NOTE — Telephone Encounter (Signed)
 Dr. Val Eagle was Don Perking d/c?

## 2023-05-05 NOTE — Telephone Encounter (Signed)
 Not aware that it was discontinued.  It was not on her med list when I saw her recently in the office I believe  If she was on it before, I believe this can be continued

## 2023-05-05 NOTE — Telephone Encounter (Signed)
 Returned call to [place] at [employer] Informed patient was not taken off Yulperi. She will reach out to patient to make aware. She thinks patient may have d/c due to cost. Advised if that is the case have patient contact office to notify physician. NFN

## 2023-05-06 ENCOUNTER — Other Ambulatory Visit: Payer: Self-pay | Admitting: Family Medicine

## 2023-05-07 ENCOUNTER — Other Ambulatory Visit: Payer: Self-pay

## 2023-05-07 ENCOUNTER — Telehealth: Payer: Self-pay

## 2023-05-07 ENCOUNTER — Other Ambulatory Visit (HOSPITAL_COMMUNITY): Payer: Self-pay

## 2023-05-07 MED ORDER — METOLAZONE 5 MG PO TABS
5.0000 mg | ORAL_TABLET | Freq: Every day | ORAL | 0 refills | Status: AC | PRN
  Filled 2023-05-07: qty 30, 30d supply, fill #0

## 2023-05-07 MED ORDER — BENZONATATE 200 MG PO CAPS
200.0000 mg | ORAL_CAPSULE | Freq: Three times a day (TID) | ORAL | 0 refills | Status: DC | PRN
  Filled 2023-05-07: qty 30, 10d supply, fill #0

## 2023-05-07 NOTE — Telephone Encounter (Signed)
 Pharmacy Patient Advocate Encounter   Received notification from CoverMyMeds that prior authorization for Repatha SureClick 140MG /ML auto-injectors is required/requested.   Insurance verification completed.   The patient is insured through [facility] Medicare.   Per test claim: PA required; PA submitted to above mentioned insurance via CoverMyMeds Key/confirmation #/EOC [id number] Status is pending

## 2023-05-07 NOTE — Telephone Encounter (Signed)
 Pharmacy Patient Advocate Encounter  Received notification from [facility] that Prior Authorization for Repatha SureClick 140MG /ML auto-injectors  has been APPROVED from 03/10/23 to 03/08/24   PA #/Case ID/Reference #: [id number]

## 2023-05-11 ENCOUNTER — Encounter (HOSPITAL_COMMUNITY): Payer: Self-pay

## 2023-05-11 ENCOUNTER — Other Ambulatory Visit (HOSPITAL_COMMUNITY): Payer: Self-pay

## 2023-05-13 ENCOUNTER — Other Ambulatory Visit: Payer: Self-pay

## 2023-05-20 DIAGNOSIS — I10 Essential (primary) hypertension: Secondary | ICD-10-CM | POA: Diagnosis not present

## 2023-05-20 DIAGNOSIS — G4733 Obstructive sleep apnea (adult) (pediatric): Secondary | ICD-10-CM | POA: Diagnosis not present

## 2023-05-20 DIAGNOSIS — J449 Chronic obstructive pulmonary disease, unspecified: Secondary | ICD-10-CM | POA: Diagnosis not present

## 2023-05-24 ENCOUNTER — Encounter (HOSPITAL_COMMUNITY): Payer: Self-pay

## 2023-05-24 ENCOUNTER — Other Ambulatory Visit: Payer: Self-pay

## 2023-05-25 ENCOUNTER — Encounter (HOSPITAL_COMMUNITY): Payer: Self-pay

## 2023-05-25 ENCOUNTER — Other Ambulatory Visit (HOSPITAL_COMMUNITY): Payer: Self-pay

## 2023-05-27 ENCOUNTER — Ambulatory Visit: Payer: Self-pay

## 2023-05-27 ENCOUNTER — Encounter (HOSPITAL_COMMUNITY): Payer: Self-pay

## 2023-05-27 ENCOUNTER — Inpatient Hospital Stay (HOSPITAL_COMMUNITY)
Admission: EM | Admit: 2023-05-27 | Discharge: 2023-05-31 | DRG: 191 | Disposition: A | Discharge status: Home / Self Care | Attending: Family Medicine | Admitting: Family Medicine

## 2023-05-27 ENCOUNTER — Emergency Department (HOSPITAL_COMMUNITY)

## 2023-05-27 ENCOUNTER — Other Ambulatory Visit: Payer: Self-pay

## 2023-05-27 DIAGNOSIS — Z1152 Encounter for screening for COVID-19: Secondary | ICD-10-CM

## 2023-05-27 DIAGNOSIS — J441 Chronic obstructive pulmonary disease with (acute) exacerbation: Secondary | ICD-10-CM | POA: Diagnosis not present

## 2023-05-27 DIAGNOSIS — M79602 Pain in left arm: Secondary | ICD-10-CM

## 2023-05-27 DIAGNOSIS — T380X5A Adverse effect of glucocorticoids and synthetic analogues, initial encounter: Secondary | ICD-10-CM | POA: Diagnosis present

## 2023-05-27 DIAGNOSIS — Z888 Allergy status to other drugs, medicaments and biological substances status: Secondary | ICD-10-CM

## 2023-05-27 DIAGNOSIS — E785 Hyperlipidemia, unspecified: Secondary | ICD-10-CM | POA: Diagnosis present

## 2023-05-27 DIAGNOSIS — I5032 Chronic diastolic (congestive) heart failure: Secondary | ICD-10-CM | POA: Diagnosis present

## 2023-05-27 DIAGNOSIS — Z882 Allergy status to sulfonamides status: Secondary | ICD-10-CM

## 2023-05-27 DIAGNOSIS — G4710 Hypersomnia, unspecified: Secondary | ICD-10-CM | POA: Diagnosis present

## 2023-05-27 DIAGNOSIS — I5030 Unspecified diastolic (congestive) heart failure: Secondary | ICD-10-CM | POA: Diagnosis present

## 2023-05-27 DIAGNOSIS — Z794 Long term (current) use of insulin: Secondary | ICD-10-CM

## 2023-05-27 DIAGNOSIS — K5190 Ulcerative colitis, unspecified, without complications: Secondary | ICD-10-CM | POA: Diagnosis present

## 2023-05-27 DIAGNOSIS — Z87440 Personal history of urinary (tract) infections: Secondary | ICD-10-CM

## 2023-05-27 DIAGNOSIS — D72829 Elevated white blood cell count, unspecified: Secondary | ICD-10-CM | POA: Diagnosis present

## 2023-05-27 DIAGNOSIS — G4730 Sleep apnea, unspecified: Secondary | ICD-10-CM | POA: Diagnosis present

## 2023-05-27 DIAGNOSIS — N179 Acute kidney failure, unspecified: Secondary | ICD-10-CM | POA: Diagnosis present

## 2023-05-27 DIAGNOSIS — E1122 Type 2 diabetes mellitus with diabetic chronic kidney disease: Secondary | ICD-10-CM | POA: Diagnosis present

## 2023-05-27 DIAGNOSIS — Z8249 Family history of ischemic heart disease and other diseases of the circulatory system: Secondary | ICD-10-CM

## 2023-05-27 DIAGNOSIS — R0602 Shortness of breath: Secondary | ICD-10-CM | POA: Diagnosis not present

## 2023-05-27 DIAGNOSIS — E66813 Obesity, class 3: Secondary | ICD-10-CM | POA: Diagnosis present

## 2023-05-27 DIAGNOSIS — Z833 Family history of diabetes mellitus: Secondary | ICD-10-CM

## 2023-05-27 DIAGNOSIS — G2581 Restless legs syndrome: Secondary | ICD-10-CM | POA: Diagnosis present

## 2023-05-27 DIAGNOSIS — G4733 Obstructive sleep apnea (adult) (pediatric): Secondary | ICD-10-CM

## 2023-05-27 DIAGNOSIS — F32A Depression, unspecified: Secondary | ICD-10-CM | POA: Diagnosis present

## 2023-05-27 DIAGNOSIS — Z7982 Long term (current) use of aspirin: Secondary | ICD-10-CM

## 2023-05-27 DIAGNOSIS — K219 Gastro-esophageal reflux disease without esophagitis: Secondary | ICD-10-CM | POA: Diagnosis present

## 2023-05-27 DIAGNOSIS — E860 Dehydration: Secondary | ICD-10-CM | POA: Diagnosis present

## 2023-05-27 DIAGNOSIS — I130 Hypertensive heart and chronic kidney disease with heart failure and stage 1 through stage 4 chronic kidney disease, or unspecified chronic kidney disease: Secondary | ICD-10-CM | POA: Diagnosis present

## 2023-05-27 DIAGNOSIS — Z91040 Latex allergy status: Secondary | ICD-10-CM

## 2023-05-27 DIAGNOSIS — R079 Chest pain, unspecified: Secondary | ICD-10-CM

## 2023-05-27 DIAGNOSIS — Z825 Family history of asthma and other chronic lower respiratory diseases: Secondary | ICD-10-CM

## 2023-05-27 DIAGNOSIS — E1142 Type 2 diabetes mellitus with diabetic polyneuropathy: Secondary | ICD-10-CM | POA: Diagnosis present

## 2023-05-27 DIAGNOSIS — E119 Type 2 diabetes mellitus without complications: Secondary | ICD-10-CM

## 2023-05-27 DIAGNOSIS — Z6841 Body Mass Index (BMI) 40.0 and over, adult: Secondary | ICD-10-CM

## 2023-05-27 DIAGNOSIS — Z79899 Other long term (current) drug therapy: Secondary | ICD-10-CM

## 2023-05-27 DIAGNOSIS — E1165 Type 2 diabetes mellitus with hyperglycemia: Secondary | ICD-10-CM | POA: Diagnosis present

## 2023-05-27 DIAGNOSIS — M25512 Pain in left shoulder: Secondary | ICD-10-CM | POA: Diagnosis present

## 2023-05-27 DIAGNOSIS — E876 Hypokalemia: Secondary | ICD-10-CM

## 2023-05-27 DIAGNOSIS — Z7951 Long term (current) use of inhaled steroids: Secondary | ICD-10-CM

## 2023-05-27 DIAGNOSIS — Z7985 Long-term (current) use of injectable non-insulin antidiabetic drugs: Secondary | ICD-10-CM

## 2023-05-27 DIAGNOSIS — Z885 Allergy status to narcotic agent status: Secondary | ICD-10-CM

## 2023-05-27 DIAGNOSIS — J9611 Chronic respiratory failure with hypoxia: Secondary | ICD-10-CM | POA: Diagnosis present

## 2023-05-27 DIAGNOSIS — R5381 Other malaise: Secondary | ICD-10-CM | POA: Diagnosis present

## 2023-05-27 DIAGNOSIS — I10 Essential (primary) hypertension: Secondary | ICD-10-CM | POA: Diagnosis present

## 2023-05-27 DIAGNOSIS — N1831 Chronic kidney disease, stage 3a: Secondary | ICD-10-CM | POA: Diagnosis present

## 2023-05-27 DIAGNOSIS — N390 Urinary tract infection, site not specified: Secondary | ICD-10-CM

## 2023-05-27 LAB — BASIC METABOLIC PANEL
Anion gap: 14 (ref 5–15)
BUN: 26 mg/dL — ABNORMAL HIGH (ref 8–23)
CO2: 35 mmol/L — ABNORMAL HIGH (ref 22–32)
Calcium: 10.6 mg/dL — ABNORMAL HIGH (ref 8.9–10.3)
Chloride: 88 mmol/L — ABNORMAL LOW (ref 98–111)
Creatinine, Ser: 1.36 mg/dL — ABNORMAL HIGH (ref 0.44–1.00)
GFR, Estimated: 42 mL/min — ABNORMAL LOW (ref >60)
Glucose, Bld: 197 mg/dL — ABNORMAL HIGH (ref 70–99)
Potassium: 3.3 mmol/L — ABNORMAL LOW (ref 3.5–5.1)
Sodium: 137 mmol/L (ref 135–145)

## 2023-05-27 LAB — CBC WITH DIFFERENTIAL/PLATELET
Abs Immature Granulocytes: 0.07 10*3/uL (ref 0.00–0.07)
Basophils Absolute: 0.1 10*3/uL (ref 0.0–0.1)
Basophils Relative: 1 %
Eosinophils Absolute: 0.2 10*3/uL (ref 0.0–0.5)
Eosinophils Relative: 1 %
HCT: 47 % — ABNORMAL HIGH (ref 36.0–46.0)
Hemoglobin: 15.3 g/dL — ABNORMAL HIGH (ref 12.0–15.0)
Immature Granulocytes: 1 %
Lymphocytes Relative: 36 %
Lymphs Abs: 4.7 10*3/uL — ABNORMAL HIGH (ref 0.7–4.0)
MCH: 30.4 pg (ref 26.0–34.0)
MCHC: 32.6 g/dL (ref 30.0–36.0)
MCV: 93.3 fL (ref 80.0–100.0)
Monocytes Absolute: 1.2 10*3/uL — ABNORMAL HIGH (ref 0.1–1.0)
Monocytes Relative: 9 %
Neutro Abs: 6.9 10*3/uL (ref 1.7–7.7)
Neutrophils Relative %: 52 %
Platelets: 271 10*3/uL (ref 150–400)
RBC: 5.04 MIL/uL (ref 3.87–5.11)
RDW: 13.8 % (ref 11.5–15.5)
WBC: 13.1 10*3/uL — ABNORMAL HIGH (ref 4.0–10.5)
nRBC: 0 % (ref 0.0–0.2)

## 2023-05-27 LAB — TROPONIN I (HIGH SENSITIVITY)
Troponin I (High Sensitivity): 20 ng/L — ABNORMAL HIGH (ref <18)
Troponin I (High Sensitivity): 20 ng/L — ABNORMAL HIGH (ref <18)

## 2023-05-27 LAB — BRAIN NATRIURETIC PEPTIDE: B Natriuretic Peptide: 53.9 pg/mL (ref 0.0–100.0)

## 2023-05-27 MED ORDER — ASPIRIN 81 MG PO CHEW
324.0000 mg | CHEWABLE_TABLET | Freq: Once | ORAL | Status: AC
  Administered 2023-05-27: 324 mg via ORAL
  Filled 2023-05-27: qty 4

## 2023-05-27 MED ORDER — NITROGLYCERIN 0.4 MG SL SUBL: 0.4000 mg | SUBLINGUAL_TABLET | SUBLINGUAL | Status: DC | PRN

## 2023-05-27 NOTE — Telephone Encounter (Signed)
Agree with ER.  Thanks.  

## 2023-05-27 NOTE — ED Notes (Signed)
 Pt remains on her chronic 2 L nasal cannula. Reports no changes to s/s. No resp distress at this time. Vs updated.

## 2023-05-27 NOTE — ED Provider Triage Note (Signed)
 Emergency Medicine Provider Triage Evaluation Note  Kathleen Hatfield , a 68 y.o. female  was evaluated in triage.  Pt complains of sob. Progressive worsening sob along with chest tightness, L arm pain and shoulder pain x 3 days.  Notice increased fluid retention despite taking her diuretic.  Felt sweaty.  Difficulty sleeping last night.  Hx CHF.    Review of Systems  Positive: As above Negative: As above  Physical Exam  BP (!) 159/118 (BP Location: Left Arm)   Pulse 98   Temp 98.4 F (36.9 C)   Resp 14   Ht 5\' 5"  (1.651 m)   Wt 117 kg   SpO2 95%   BMI 42.93 kg/m  Gen:   Awake, no distress   Resp:  Normal effort  MSK:   Moves extremities without difficulty  Other:  Wearing supplemental O2  Medical Decision Making  Medically screening exam initiated at 3:11 PM.  Appropriate orders placed.  AMAN BATLEY was informed that the remainder of the evaluation will be completed by another provider, this initial triage assessment does not replace that evaluation, and the importance of remaining in the ED until their evaluation is complete.     Fayrene Helper, PA-C 05/27/23 [phone]

## 2023-05-27 NOTE — Telephone Encounter (Signed)
 Chief Complaint: Chest tightness Symptoms: sweating, cough, nausea, SOB Frequency: x 3 days Pertinent Negatives: Patient denies fever Disposition: [x] ED /[] Urgent Care (no appt availability in office) / [] Appointment(In office/virtual)/ []  [facility]/ [] Home Care/ [] Refused Recommended Disposition /[] [facility]/ []  Follow-up with PCP Additional Notes: Pt reports she has been experiencing 8/10 chest tightness, pain that began in her left hand and has since moved to her arm and left shoulder. Pt denies fever. Pt also reports she feels she is retaining more fluid and her medications do not seem to be helping. Pt advised to proceed to ED, verbalized understanding and agrees to plan. This RN educated pt on home care, new-worsening symptoms, when to call back/seek emergent care. Pt verbalized understanding and agrees to plan.    Copied from CRM [id number]. Topic: Clinical - Red Word Triage >> May 27, 2023 12:28 PM Adele Barthel wrote: Red Word that prompted transfer to Nurse Triage:   Left arm pain that has moved up into shoulder for past several days Severe fatigue Mental fog Feels her potassium may be low Reason for Disposition  Pain also in shoulder(s) or arm(s) or jaw  (Exception: Pain is clearly made worse by movement.)  Answer Assessment - Initial Assessment Questions 1. LOCATION: "Where does it hurt?"       Chest tightness 2. RADIATION: "Does the pain go anywhere else?" (e.g., into neck, jaw, arms, back)     Left hand, arm, shoulder 3. ONSET: "When did the chest pain begin?" (Minutes, hours or days)      X 3 days 4. PATTERN: "Does the pain come and go, or has it been constant since it started?"  "Does it get worse with exertion?"      Constant  6. SEVERITY: "How bad is the pain?"  (e.g., Scale 1-10; mild, moderate, or severe)    - MILD (1-3): doesn't interfere with normal activities     - MODERATE (4-7): interferes with normal activities or awakens from sleep    -  SEVERE (8-10): excruciating pain, unable to do any normal activities       8/10 7. CARDIAC RISK FACTORS: "Do you have any history of heart problems or risk factors for heart disease?" (e.g., angina, prior heart attack; diabetes, high blood pressure, high cholesterol, smoker, or strong family history of heart disease)     T2DM, high cholesterol 8. PULMONARY RISK FACTORS: "Do you have any history of lung disease?"  (e.g., blood clots in lung, asthma, emphysema, birth control pills)     Asthma, COPD 9. CAUSE: "What do you think is causing the chest pain?"     Potassium may be low per pt 10. OTHER SYMPTOMS: "Do you have any other symptoms?" (e.g., dizziness, nausea, vomiting, sweating, fever, difficulty breathing, cough)       Nausea with cough, sweating, SOB  Protocols used: Chest Pain-A-AH

## 2023-05-27 NOTE — ED Notes (Signed)
 New oxygen tank provided to pt. S/O with pt.

## 2023-05-27 NOTE — ED Triage Notes (Signed)
 Pt came in via POV d/t this past week having Lt arm pain that radiates into her Lt shoulder. Having SOB & her usual home meds are not helping alleviate her symptoms. Pt reports she has gained 10 lbs in the past 2 days, has not missed any doses of her diuretic.

## 2023-05-28 ENCOUNTER — Observation Stay (HOSPITAL_COMMUNITY)

## 2023-05-28 DIAGNOSIS — M25512 Pain in left shoulder: Secondary | ICD-10-CM | POA: Diagnosis not present

## 2023-05-28 DIAGNOSIS — J441 Chronic obstructive pulmonary disease with (acute) exacerbation: Secondary | ICD-10-CM | POA: Diagnosis not present

## 2023-05-28 DIAGNOSIS — M79632 Pain in left forearm: Secondary | ICD-10-CM | POA: Diagnosis not present

## 2023-05-28 DIAGNOSIS — E876 Hypokalemia: Secondary | ICD-10-CM

## 2023-05-28 DIAGNOSIS — N390 Urinary tract infection, site not specified: Secondary | ICD-10-CM

## 2023-05-28 DIAGNOSIS — M19012 Primary osteoarthritis, left shoulder: Secondary | ICD-10-CM | POA: Diagnosis not present

## 2023-05-28 DIAGNOSIS — R079 Chest pain, unspecified: Secondary | ICD-10-CM

## 2023-05-28 DIAGNOSIS — M79602 Pain in left arm: Secondary | ICD-10-CM

## 2023-05-28 LAB — GLUCOSE, CAPILLARY
Glucose-Capillary: 237 mg/dL — ABNORMAL HIGH (ref 70–99)
Glucose-Capillary: 382 mg/dL — ABNORMAL HIGH (ref 70–99)
Glucose-Capillary: 408 mg/dL — ABNORMAL HIGH (ref 70–99)

## 2023-05-28 LAB — BLOOD GAS, VENOUS
Acid-Base Excess: 19.3 mmol/L — ABNORMAL HIGH (ref 0.0–2.0)
Bicarbonate: 47.3 mmol/L — ABNORMAL HIGH (ref 20.0–28.0)
Drawn by: 8289
O2 Saturation: 55.2 %
Patient temperature: 36.8
pCO2, Ven: 67 mmHg — ABNORMAL HIGH (ref 44–60)
pH, Ven: 7.45 — ABNORMAL HIGH (ref 7.25–7.43)
pO2, Ven: 31 mmHg — CL (ref 32–45)

## 2023-05-28 LAB — BASIC METABOLIC PANEL
Anion gap: 16 — ABNORMAL HIGH (ref 5–15)
BUN: 34 mg/dL — ABNORMAL HIGH (ref 8–23)
CO2: 35 mmol/L — ABNORMAL HIGH (ref 22–32)
Calcium: 10 mg/dL (ref 8.9–10.3)
Chloride: 90 mmol/L — ABNORMAL LOW (ref 98–111)
Creatinine, Ser: 1.72 mg/dL — ABNORMAL HIGH (ref 0.44–1.00)
GFR, Estimated: 32 mL/min — ABNORMAL LOW (ref >60)
Glucose, Bld: 193 mg/dL — ABNORMAL HIGH (ref 70–99)
Potassium: 3.1 mmol/L — ABNORMAL LOW (ref 3.5–5.1)
Sodium: 141 mmol/L (ref 135–145)

## 2023-05-28 LAB — CBC
HCT: 44.2 % (ref 36.0–46.0)
Hemoglobin: 14.4 g/dL (ref 12.0–15.0)
MCH: 31 pg (ref 26.0–34.0)
MCHC: 32.6 g/dL (ref 30.0–36.0)
MCV: 95.1 fL (ref 80.0–100.0)
Platelets: 230 10*3/uL (ref 150–400)
RBC: 4.65 MIL/uL (ref 3.87–5.11)
RDW: 14.1 % (ref 11.5–15.5)
WBC: 10.1 10*3/uL (ref 4.0–10.5)
nRBC: 0 % (ref 0.0–0.2)

## 2023-05-28 LAB — RESP PANEL BY RT-PCR (RSV, FLU A&B, COVID)  RVPGX2
Influenza A by PCR: NEGATIVE
Influenza B by PCR: NEGATIVE
Resp Syncytial Virus by PCR: NEGATIVE
SARS Coronavirus 2 by RT PCR: NEGATIVE

## 2023-05-28 LAB — URIC ACID: Uric Acid, Serum: 13.3 mg/dL — ABNORMAL HIGH (ref 2.5–7.1)

## 2023-05-28 LAB — MAGNESIUM: Magnesium: 1.9 mg/dL (ref 1.7–2.4)

## 2023-05-28 LAB — C-REACTIVE PROTEIN: CRP: 1 mg/dL — ABNORMAL HIGH (ref <1.0)

## 2023-05-28 LAB — D-DIMER, QUANTITATIVE: D-Dimer, Quant: 0.6 ug{FEU}/mL — ABNORMAL HIGH (ref 0.00–0.50)

## 2023-05-28 LAB — TSH: TSH: 2.371 u[IU]/mL (ref 0.350–4.500)

## 2023-05-28 LAB — SEDIMENTATION RATE: Sed Rate: 32 mm/h — ABNORMAL HIGH (ref 0–22)

## 2023-05-28 LAB — CBG MONITORING, ED: Glucose-Capillary: 173 mg/dL — ABNORMAL HIGH (ref 70–99)

## 2023-05-28 MED ORDER — IPRATROPIUM-ALBUTEROL 0.5-2.5 (3) MG/3ML IN SOLN
3.0000 mL | Freq: Four times a day (QID) | RESPIRATORY_TRACT | Status: AC
  Administered 2023-05-28 (×2): 3 mL via RESPIRATORY_TRACT
  Filled 2023-05-28 (×3): qty 3

## 2023-05-28 MED ORDER — POTASSIUM CHLORIDE 20 MEQ PO PACK
40.0000 meq | PACK | Freq: Once | ORAL | Status: AC
  Administered 2023-05-28: 40 meq via ORAL
  Filled 2023-05-28: qty 2

## 2023-05-28 MED ORDER — ACETAMINOPHEN 650 MG RE SUPP: 650.0000 mg | Freq: Four times a day (QID) | RECTAL | Status: DC | PRN

## 2023-05-28 MED ORDER — SPIRONOLACTONE 12.5 MG HALF TABLET: 12.5000 mg | ORAL_TABLET | Freq: Every day | ORAL | Status: DC

## 2023-05-28 MED ORDER — DICYCLOMINE HCL 20 MG PO TABS
20.0000 mg | ORAL_TABLET | Freq: Four times a day (QID) | ORAL | Status: DC | PRN
  Administered 2023-05-31: 20 mg via ORAL
  Filled 2023-05-28 (×2): qty 1

## 2023-05-28 MED ORDER — ROPINIROLE HCL 1 MG PO TABS
4.0000 mg | ORAL_TABLET | Freq: Once | ORAL | Status: AC
  Administered 2023-05-28: 4 mg via ORAL
  Filled 2023-05-28: qty 4

## 2023-05-28 MED ORDER — ONDANSETRON HCL 4 MG/2ML IJ SOLN
4.0000 mg | Freq: Once | INTRAMUSCULAR | Status: AC
  Administered 2023-05-28: 4 mg via INTRAVENOUS
  Filled 2023-05-28: qty 2

## 2023-05-28 MED ORDER — ONDANSETRON HCL 4 MG/2ML IJ SOLN: 4.0000 mg | Freq: Four times a day (QID) | INTRAMUSCULAR | Status: DC | PRN

## 2023-05-28 MED ORDER — ARFORMOTEROL TARTRATE 15 MCG/2ML IN NEBU
15.0000 ug | INHALATION_SOLUTION | Freq: Two times a day (BID) | RESPIRATORY_TRACT | Status: DC
  Administered 2023-05-28 – 2023-05-31 (×7): 15 ug via RESPIRATORY_TRACT
  Filled 2023-05-28 (×7): qty 2

## 2023-05-28 MED ORDER — ROPINIROLE HCL 1 MG PO TABS: 4.0000 mg | ORAL_TABLET | Freq: Every day | ORAL | Status: DC

## 2023-05-28 MED ORDER — INSULIN ASPART 100 UNIT/ML IJ SOLN
0.0000 [IU] | Freq: Three times a day (TID) | INTRAMUSCULAR | Status: DC
  Administered 2023-05-28: 20 [IU] via SUBCUTANEOUS
  Administered 2023-05-28: 4 [IU] via SUBCUTANEOUS
  Administered 2023-05-29 (×2): 15 [IU] via SUBCUTANEOUS
  Administered 2023-05-29 – 2023-05-30 (×2): 20 [IU] via SUBCUTANEOUS
  Administered 2023-05-30: 11 [IU] via SUBCUTANEOUS
  Administered 2023-05-30: 7 [IU] via SUBCUTANEOUS
  Administered 2023-05-31: 11 [IU] via SUBCUTANEOUS
  Administered 2023-05-31: 4 [IU] via SUBCUTANEOUS

## 2023-05-28 MED ORDER — INSULIN ASPART 100 UNIT/ML IJ SOLN: 10.0000 [IU] | Freq: Once | INTRAMUSCULAR | Status: DC

## 2023-05-28 MED ORDER — COLCHICINE 0.6 MG PO TABS: 0.6000 mg | ORAL_TABLET | Freq: Every day | ORAL | Status: DC

## 2023-05-28 MED ORDER — ALBUTEROL SULFATE (2.5 MG/3ML) 0.083% IN NEBU
10.0000 mg | INHALATION_SOLUTION | RESPIRATORY_TRACT | Status: DC
  Administered 2023-05-28: 10 mg via RESPIRATORY_TRACT
  Filled 2023-05-28: qty 12

## 2023-05-28 MED ORDER — PREDNISONE 20 MG PO TABS
40.0000 mg | ORAL_TABLET | Freq: Every day | ORAL | Status: DC
  Administered 2023-05-29 – 2023-05-31 (×3): 40 mg via ORAL
  Filled 2023-05-28 (×3): qty 2

## 2023-05-28 MED ORDER — METHYLPREDNISOLONE SODIUM SUCC 125 MG IJ SOLR
125.0000 mg | Freq: Once | INTRAMUSCULAR | Status: AC
  Administered 2023-05-28: 125 mg via INTRAVENOUS
  Filled 2023-05-28: qty 2

## 2023-05-28 MED ORDER — BENZONATATE 100 MG PO CAPS
200.0000 mg | ORAL_CAPSULE | Freq: Three times a day (TID) | ORAL | Status: DC | PRN
  Administered 2023-05-30 (×2): 200 mg via ORAL
  Filled 2023-05-28 (×2): qty 2

## 2023-05-28 MED ORDER — FERROUS SULFATE 325 (65 FE) MG PO TABS
325.0000 mg | ORAL_TABLET | ORAL | Status: DC
  Administered 2023-05-31: 325 mg via ORAL
  Filled 2023-05-28: qty 1

## 2023-05-28 MED ORDER — IPRATROPIUM-ALBUTEROL 0.5-2.5 (3) MG/3ML IN SOLN
3.0000 mL | Freq: Once | RESPIRATORY_TRACT | Status: AC
  Administered 2023-05-28: 3 mL via RESPIRATORY_TRACT
  Filled 2023-05-28: qty 3

## 2023-05-28 MED ORDER — ACETAMINOPHEN 500 MG PO TABS
1000.0000 mg | ORAL_TABLET | Freq: Once | ORAL | Status: AC
  Administered 2023-05-28: 1000 mg via ORAL
  Filled 2023-05-28: qty 2

## 2023-05-28 MED ORDER — GABAPENTIN 300 MG PO CAPS
300.0000 mg | ORAL_CAPSULE | Freq: Two times a day (BID) | ORAL | Status: DC
  Administered 2023-05-28 – 2023-05-31 (×7): 300 mg via ORAL
  Filled 2023-05-28 (×7): qty 1

## 2023-05-28 MED ORDER — BUPROPION HCL ER (XL) 150 MG PO TB24
300.0000 mg | ORAL_TABLET | Freq: Every morning | ORAL | Status: DC
  Administered 2023-05-29 – 2023-05-31 (×3): 300 mg via ORAL
  Filled 2023-05-28 (×3): qty 2

## 2023-05-28 MED ORDER — IPRATROPIUM BROMIDE 0.02 % IN SOLN
0.5000 mg | Freq: Once | RESPIRATORY_TRACT | Status: AC
  Administered 2023-05-28: 0.5 mg via RESPIRATORY_TRACT
  Filled 2023-05-28: qty 2.5

## 2023-05-28 MED ORDER — INSULIN GLARGINE 100 UNIT/ML ~~LOC~~ SOLN
55.0000 [IU] | Freq: Every day | SUBCUTANEOUS | Status: DC
  Administered 2023-05-28: 55 [IU] via SUBCUTANEOUS
  Filled 2023-05-28 (×3): qty 0.55

## 2023-05-28 MED ORDER — INSULIN ASPART 100 UNIT/ML IJ SOLN
8.0000 [IU] | Freq: Once | INTRAMUSCULAR | Status: AC
  Administered 2023-05-28: 8 [IU] via SUBCUTANEOUS

## 2023-05-28 MED ORDER — ENOXAPARIN SODIUM 60 MG/0.6ML IJ SOSY
60.0000 mg | PREFILLED_SYRINGE | Freq: Every day | INTRAMUSCULAR | Status: DC
  Administered 2023-05-29 – 2023-05-31 (×3): 60 mg via SUBCUTANEOUS
  Filled 2023-05-28 (×3): qty 0.6

## 2023-05-28 MED ORDER — TRAMADOL HCL 50 MG PO TABS: 50.0000 mg | ORAL_TABLET | Freq: Every day | ORAL | Status: DC | PRN

## 2023-05-28 MED ORDER — INSULIN DEGLUDEC 200 UNIT/ML ~~LOC~~ SOPN: 55.0000 [IU] | PEN_INJECTOR | Freq: Every day | SUBCUTANEOUS | Status: DC

## 2023-05-28 MED ORDER — ONDANSETRON HCL 4 MG PO TABS: 4.0000 mg | ORAL_TABLET | Freq: Four times a day (QID) | ORAL | Status: DC | PRN

## 2023-05-28 MED ORDER — NITROFURANTOIN MACROCRYSTAL 100 MG PO CAPS: 100.0000 mg | ORAL_CAPSULE | Freq: Every day | ORAL | Status: DC

## 2023-05-28 MED ORDER — DULOXETINE HCL 60 MG PO CPEP
60.0000 mg | ORAL_CAPSULE | Freq: Every day | ORAL | Status: DC
  Administered 2023-05-28 – 2023-05-30 (×3): 60 mg via ORAL
  Filled 2023-05-28 (×3): qty 1

## 2023-05-28 MED ORDER — BUDESONIDE 0.5 MG/2ML IN SUSP
0.5000 mg | Freq: Two times a day (BID) | RESPIRATORY_TRACT | Status: DC
  Administered 2023-05-28 – 2023-05-31 (×6): 0.5 mg via RESPIRATORY_TRACT
  Filled 2023-05-28 (×7): qty 2

## 2023-05-28 MED ORDER — POTASSIUM CHLORIDE CRYS ER 20 MEQ PO TBCR: 20.0000 meq | EXTENDED_RELEASE_TABLET | Freq: Two times a day (BID) | ORAL | Status: DC

## 2023-05-28 MED ORDER — ENOXAPARIN SODIUM 30 MG/0.3ML IJ SOSY
30.0000 mg | PREFILLED_SYRINGE | INTRAMUSCULAR | Status: DC
  Administered 2023-05-28: 30 mg via SUBCUTANEOUS
  Filled 2023-05-28: qty 0.3

## 2023-05-28 MED ORDER — POTASSIUM CHLORIDE CRYS ER 20 MEQ PO TBCR
40.0000 meq | EXTENDED_RELEASE_TABLET | Freq: Once | ORAL | Status: DC
  Filled 2023-05-28: qty 2

## 2023-05-28 MED ORDER — FUROSEMIDE 20 MG PO TABS: 60.0000 mg | ORAL_TABLET | Freq: Two times a day (BID) | ORAL | Status: DC

## 2023-05-28 MED ORDER — ACETAMINOPHEN 325 MG PO TABS
650.0000 mg | ORAL_TABLET | Freq: Four times a day (QID) | ORAL | Status: DC | PRN
  Administered 2023-05-29 – 2023-05-31 (×4): 650 mg via ORAL
  Filled 2023-05-28 (×4): qty 2

## 2023-05-28 MED ORDER — INSULIN DEGLUDEC 200 UNIT/ML ~~LOC~~ SOPN: 50.0000 [IU] | PEN_INJECTOR | Freq: Every day | SUBCUTANEOUS | Status: DC

## 2023-05-28 MED ORDER — POTASSIUM CHLORIDE 20 MEQ PO PACK: 40.0000 meq | PACK | Freq: Two times a day (BID) | ORAL | Status: DC

## 2023-05-28 MED ORDER — ROPINIROLE HCL 1 MG PO TABS
4.0000 mg | ORAL_TABLET | Freq: Every day | ORAL | Status: DC
  Administered 2023-05-28 – 2023-05-30 (×3): 4 mg via ORAL
  Filled 2023-05-28 (×3): qty 4

## 2023-05-28 NOTE — Assessment & Plan Note (Addendum)
 Statin intolerant. Praluent q 14 days

## 2023-05-28 NOTE — Assessment & Plan Note (Signed)
 Continue cpap at night

## 2023-05-28 NOTE — Assessment & Plan Note (Signed)
 Resolved and reproducible on exam  EKG wnl no acute findings, troponin negative x 2 Atypical. Check inflammatory markers Suspect more MSK/costochondritis  Follow on tele

## 2023-05-28 NOTE — Progress Notes (Addendum)
 [facility] night cross cover note:   I was notified by RN of the patient's request for resumption of her home Requip.  I subsequently retimed existing order for Requip so that first dose will occur this evening.  Additionally, RN conveys that the patient's evening blood sugar is 408, up from 382 following interval administration of 20 units of subcutaneous NovoLog.  I subsequently ordered an additional 8 units of NovoLog subcu x 1 dose now along with a recheck of her CBG to occur around 2330 this evening.  This is in addition to her existing order for basal insulin, specifically her existing order for Lantus 55 units SQ nightly.  Suspect hypoglycemic contribution from systemic corticosteroids, which were started earlier today as a component of management of her presenting acute COPD exacerbation.   Update: per patient's request, I I reviewed the existing treatment plan with the patient at bedside, and also added prn Tessalon Perles for cough.    Newton Pigg, DO Hospitalist

## 2023-05-28 NOTE — Assessment & Plan Note (Signed)
 Continue requip

## 2023-05-28 NOTE — Assessment & Plan Note (Addendum)
 68 year old presenting to ED with 3 week history of worsening shortness of breath, chest tightness and dyspnea on exertion found to be wheezing and tight on exam consistent with COPD exacerbation  -obs to tele -given nebs in ED with some relief, start steroids IV>PO. NO wheezing on my exam, but still feels tight  -schedule duonebs -continue her home pulmicort, brovana -SABA prn  -pulmonary hygiene  -did not have cpap last night  -chronic respiratory failure on 2L stable  -no increased coughing/viscosity/sputum. No indication for abx -echo in 10/24 with no signs of pulmonary HTN  -d-dimer .60, just at age adjusted and around baseline for her. NO leg swelling/tachycardia or hypoxia  -I think debility also playing a role in her chronic shortness of breath

## 2023-05-28 NOTE — ED Notes (Signed)
 Pt maintained SPO2 saturation of 96% while ambulating with walker, Pt unable to ambulate with steady gait more than a few steps with just a walker and one person assist with ambulation.

## 2023-05-28 NOTE — Assessment & Plan Note (Addendum)
 Hold aldactone 12.5mg  daily, lasix 60mg  BID due to AKI on CKD  PRN hydralazine

## 2023-05-28 NOTE — Progress Notes (Signed)
 Pt confirms hx of osa. States she does not wish to wear hospital provided cpap during her admission. Encouraged pt to notify RT should she decide to wear cpap.

## 2023-05-28 NOTE — Assessment & Plan Note (Signed)
 Hold macrobid in setting of AKI on CKD

## 2023-05-28 NOTE — ED Provider Notes (Signed)
 [place] EMERGENCY DEPARTMENT AT [facility] Provider Note  CSN: [id number] Arrival date & time: 05/27/23 1321  Chief Complaint(s) SOB and Lt arm pain  HPI Kathleen Hatfield is a 68 y.o. 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  The history is provided by the patient.  Shortness of Breath Severity:  Moderate Onset quality:  Gradual Duration:  3 weeks Timing:  Constant Progression:  Worsening Chronicity:  Recurrent Relieved by:  Nothing Worsened by:  Activity Associated symptoms: chest pain (left chest wall pain started 24 hrs ago. constant. worse with ROM of left arm. resolved with ASA when she first arrived.) and cough   Associated symptoms: no fever and no sputum production    10lbs gain in 3 days. Decreased UOP despite diuretics. She is endorsing worsening dyspnea on exertion.  She reports that she can typically walk across her home before being short of breath however now she can barely make it to the door which is 5 feet from her bed before being short of breath. She has been compliant with her all her medications including her diuretics.  Denies any prior history of DVT/PE.  Past Medical History Past Medical History:  Diagnosis Date   Anemia    Arthritis    Asthma    Chronic diastolic CHF 05/2014   Echocardiogram 05/2019: EF 70, no RWMA, mild LVH, Gr 1 DD, normal RVSF, severe LVH, borderline asc Aorta (39 mm)   CKD (chronic kidney disease)    Depression    Diabetes mellitus without complication (HCC)    Diverticulitis    Dyspnea    with exertion   GERD (gastroesophageal reflux disease)    History of blood transfusion    Hyperlipidemia    cannot tolerate statins   Hypertension    patient states she has never had high blood pressure.     Nuclear stress test    Myoview 05/2019: EF 83, no ischemia or infarction; Low Risk   Peripheral neuropathy    Pneumonia    PONV (postoperative nausea and vomiting)    RLS (restless legs syndrome)    Sleep apnea    uses CPAP   Ulcerative colitis [facility])    dr [address]   Patient Active Problem List   Diagnosis Date Noted   Rash 03/21/2023   Tremor 03/21/2023   Allergic rhinitis 01/28/2023   Epigastric pain 01/21/2023   Shortness of breath 11/24/2022   Chronic hypoxic respiratory failure (HCC) 11/24/2022   Rhinovirus infection 11/24/2022   COPD with hypoxia (HCC) 11/23/2022   Acute bronchitis with COPD (HCC) 11/23/2022   Diabetes mellitus (HCC) 03/31/2022   Myalgia 02/25/2022   Prolonged Q-T interval on ECG 02/25/2022   Aortic atherosclerosis (HCC) 06/04/2021   Hyperglycemia 02/14/2021   Mixed diabetic hyperlipidemia associated with type 2 diabetes mellitus (HCC) 09/19/2020   Constipation 06/11/2020   Diverticulosis of colon 06/11/2020   Irritable bowel syndrome 06/11/2020   Periumbilical pain 06/11/2020   RLS (restless legs syndrome)    Muscle weakness 01/08/2020   Grade I diastolic dysfunction 01/08/2020   Drug-induced myopathy 10/10/2019   Advance care planning 06/14/2019   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   Leukocytosis 06/14/2019   Hypercalcemia 06/14/2019   Gout 06/14/2019   CKD (chronic kidney disease) stage 3, GFR 30-59 ml/min (HCC)  Hypersomnia with sleep apnea 09/16/2015   Fatigue 06/07/2015   Morbid obesity (HCC) 06/07/2015   Chronic diastolic CHF 05/2014   (HFpEF) heart failure with preserved ejection fraction (HCC)    OSA on CPAP 08/23/2012   Essential hypertension    Depression    GERD without esophagitis    Hyperlipidemia    Ulcerative colitis (HCC)    Patellar tendinitis 05/25/2011   Myofascial pain 05/25/2011   Cervicalgia 05/25/2011   Home Medication(s) Prior to Admission medications   Medication Sig Start Date End Date Taking? Authorizing  Provider  ABRYSVO 120 MCG/0.5ML injection  12/24/22   [provider]  albuterol (VENTOLIN HFA) 108 (90 Base) MCG/ACT inhaler Inhale 2 puffs into the lungs 2 (two) to 3 (three) times daily as needed for shortness of breath/wheeze. 12/08/22   Joaquim Nam, MD  Alirocumab (PRALUENT) 150 MG/ML SOAJ Inject 1 mL (150 mg total) into the skin every 14 (fourteen) days. 12/08/22   Joaquim Nam, MD  arformoterol (BROVANA) 15 MCG/2ML NEBU Take 2 mLs (15 mcg total) by nebulization 2 (two) times daily. 12/10/22   Cobb, Ruby Cola, NP  aspirin EC 81 MG tablet Take 1 tablet (81 mg total) by mouth daily. Swallow whole. 06/04/21   Tereso Newcomer T, PA-C  benzonatate (TESSALON) 200 MG capsule Take 1 capsule (200 mg total) by mouth 3 (three) times daily as needed. 05/07/23   Joaquim Nam, MD  budesonide (PULMICORT) 0.5 MG/2ML nebulizer solution Take 2 mLs (0.5 mg total) by nebulization 2 (two) times daily. 12/10/22   Cobb, Ruby Cola, NP  buPROPion (WELLBUTRIN XL) 300 MG 24 hr tablet Take 1 tablet (300 mg total) by mouth in the morning. 11/03/22   Joaquim Nam, MD  Cholecalciferol (VITAMIN D-3) 5000 UNITS TABS Take 5,000 Units by mouth every other day.     [provider]  colchicine 0.6 MG tablet Take 1 tablet (0.6 mg total) by mouth daily as needed for gout. 12/08/22   Joaquim Nam, MD  Continuous Blood Gluc Sensor (DEXCOM G7 SENSOR) MISC 1 Device by Does not apply route as directed. Apply sensor every 10 days 03/31/22   Shamleffer, Konrad Dolores, MD  dicyclomine (BENTYL) 20 MG tablet Take 1 tablet (20 mg total) by mouth 4 (four) times daily as needed for GI cramping, pain, nausea/vomiting. 12/08/22   Joaquim Nam, MD  doxycycline (VIBRA-TABS) 100 MG tablet Take 1 tablet (100 mg total) by mouth 2 (two) times daily. 04/14/23   Tomma Lightning, MD  DULoxetine (CYMBALTA) 60 MG capsule Take 1 capsule (60 mg total) by mouth at bedtime. 03/11/23   Joaquim Nam, MD  estradiol (ESTRACE)  0.1 MG/GM vaginal cream Place 1 Applicatorful vaginally 3 (three) times a week.    [provider]  ferrous sulfate 325 (65 FE) MG tablet Take 1 tablet (325 mg total) by mouth every other day. 12/08/22   Joaquim Nam, MD  fluticasone (FLONASE) 50 MCG/ACT nasal spray Place 2 sprays into both nostrils as needed. 03/15/23   Joaquim Nam, MD  FLUZONE HIGH-DOSE 0.5 ML injection  12/24/22   [provider]  furosemide (LASIX) 20 MG tablet Take 3 tablets (60 mg total) by mouth 2 (two) times daily. 12/29/22   Tonny Bollman, MD  gabapentin (NEURONTIN) 300 MG capsule Take 1 capsule (300 mg total) by mouth 2 (two) times daily. 12/29/22   Joaquim Nam, MD  insulin aspart (NOVOLOG FLEXPEN) 100 UNIT/ML FlexPen Inject 16 Units  into the skin 3 (three) times daily with meals. Per sliding scale.  Max daily 45 units 04/09/23   Shamleffer, Konrad Dolores, MD  insulin degludec (TRESIBA FLEXTOUCH) 200 UNIT/ML FlexTouch Pen Inject 110 Units into the skin daily. 04/09/23   Shamleffer, Konrad Dolores, MD  Insulin Pen Needle (GLOBAL EASE INJECT PEN NEEDLES) 32G X 4 MM MISC Use for insulin in the morning, at noon, in the evening, and at bedtime. 04/09/23   Shamleffer, Konrad Dolores, MD  ipratropium-albuterol (DUONEB) 0.5-2.5 (3) MG/3ML SOLN Take 3 mLs by nebulization every 6 (six) hours as needed (wheezing). 02/10/21   Coralyn Helling, MD  ketoconazole (NIZORAL) 2 % cream Apply 1 Application topically 2 (two) times daily. 04/29/22   Joaquim Nam, MD  mesalamine (LIALDA) 1.2 g EC tablet Take 2 tablets (2.4 g total) by mouth 2 (two) times daily. 03/01/23     metolazone (ZAROXOLYN) 5 MG tablet Take 1 tablet (5 mg total) by mouth daily as needed. 05/07/23   Joaquim Nam, MD  nitrofurantoin (MACRODANTIN) 100 MG capsule Take 1 capsule (100 mg total) by mouth daily. 03/31/23     nitrofurantoin, macrocrystal-monohydrate, (MACROBID) 100 MG capsule Take 1 capsule (100 mg total) by mouth at bedtime. 03/18/23    Joaquim Nam, MD  ondansetron (ZOFRAN) 4 MG tablet Take 1 tablet (4 mg total) by mouth every 8 (eight) hours as needed for nausea and vomiting 12/29/22   Joaquim Nam, MD  OXYGEN Inhale 2 L into the lungs continuous.    [provider]  pantoprazole (PROTONIX) 40 MG tablet Take 1 tablet (40 mg total) by mouth every morning. 01/05/23   Joaquim Nam, MD  potassium chloride (MICRO-K) 10 MEQ CR capsule Take 2 capsules (20 mEq total) by mouth in the morning AND 1 capsule (10 mEq total) daily at 12 noon AND 2 capsules (20 mEq total) every evening. 12/08/22   Joaquim Nam, MD  predniSONE (DELTASONE) 20 MG tablet Take 1 tablet (20 mg total) by mouth daily with breakfast. 04/14/23   Virl Diamond A, MD  rOPINIRole (REQUIP) 4 MG tablet Take 1 tablet (4 mg total) by mouth at bedtime. 12/08/22   Joaquim Nam, MD  Spacer/Aero-Holding Deretha [facility] DIAMOND) MISC Use as dirtected with inhaler 12/10/22     spironolactone (ALDACTONE) 25 MG tablet Take 0.5 tablets (12.5 mg total) by mouth daily. 12/30/22   Joaquim Nam, MD  tirzepatide [facility]) 2.5 MG/0.5ML Pen Inject 2.5 mg into the skin once a week. 04/09/23   Shamleffer, Konrad Dolores, MD  traMADol (ULTRAM) 50 MG tablet Take 1 tablet (50 mg total) by mouth 3 (three) times daily as needed. 11/04/22   Joaquim Nam, MD  triamcinolone cream (KENALOG) 0.1 % Apply 1 Application topically 2 (two) times daily as needed. 03/18/23   Joaquim Nam, MD  UNABLE TO FIND CPAP- At bedtime    [provider]  Allergies Almond oil, Morphine and codeine, Primidone, Atorvastatin, Ceclor [cefaclor], Sulfa antibiotics, Ciprofloxacin, Levaquin [levofloxacin], Losartan, Peanut-containing drug products, Repatha [evolocumab], and Statins  Review of Systems Review of Systems  Constitutional:  Negative for  fever.  Respiratory:  Positive for cough and shortness of breath. Negative for sputum production.   Cardiovascular:  Positive for chest pain (left chest wall pain started 24 hrs ago. constant. worse with ROM of left arm. resolved with ASA when she first arrived.).   As noted in HPI  Physical Exam Vital Signs  I have reviewed the triage vital signs BP (!) 152/70   Pulse 89   Temp 98.4 F (36.9 C)   Resp 18   Ht 5\' 5"  (1.651 m)   Wt 117 kg   SpO2 99%   BMI 42.93 kg/m   Physical Exam Vitals reviewed.  Constitutional:      General: She is not in acute distress.    Appearance: She is well-developed. She is not diaphoretic.  HENT:     Head: Normocephalic and atraumatic.     Nose: Nose normal.  Eyes:     General: No scleral icterus.       Right eye: No discharge.        Left eye: No discharge.     Conjunctiva/sclera: Conjunctivae normal.     Pupils: Pupils are equal, round, and reactive to light.  Cardiovascular:     Rate and Rhythm: Normal rate and regular rhythm.     Heart sounds: No murmur heard.    No friction rub. No gallop.  Pulmonary:     Effort: Pulmonary effort is normal. Tachypnea present. No respiratory distress.     Breath sounds: Decreased air movement present. No stridor. Wheezing present. No rales.     Comments: SOB with speaking Abdominal:     General: There is no distension.     Palpations: Abdomen is soft.     Tenderness: There is no abdominal tenderness.  Musculoskeletal:        General: No tenderness.     Cervical back: Normal range of motion and neck supple.     Right lower leg: No edema.     Left lower leg: No edema.  Skin:    General: Skin is warm and dry.     Findings: No erythema or rash.  Neurological:     Mental Status: She is alert and oriented to person, place, and time.     ED Results and Treatments Labs (all labs ordered are listed, but only abnormal results are displayed) Labs Reviewed  BASIC METABOLIC PANEL - Abnormal; Notable  for the following components:      Result Value   Potassium 3.3 (*)    Chloride 88 (*)    CO2 35 (*)    Glucose, Bld 197 (*)    BUN 26 (*)    Creatinine, Ser 1.36 (*)    Calcium 10.6 (*)    GFR, Estimated 42 (*)    All other components within normal limits  CBC WITH DIFFERENTIAL/PLATELET - Abnormal; Notable for the following components:   WBC 13.1 (*)    Hemoglobin 15.3 (*)    HCT 47.0 (*)    Lymphs Abs 4.7 (*)    Monocytes Absolute 1.2 (*)    All other components within normal limits  TROPONIN I (HIGH SENSITIVITY) - Abnormal; Notable for the following components:   Troponin I (High Sensitivity) 20 (*)    All other components within normal limits  TROPONIN  I (HIGH SENSITIVITY) - Abnormal; Notable for the following components:   Troponin I (High Sensitivity) 20 (*)    All other components within normal limits  BRAIN NATRIURETIC PEPTIDE  D-DIMER, QUANTITATIVE                                                                                                                         EKG  EKG Interpretation Date/Time:  Thursday May 27 2023 15:29:11 EDT Ventricular Rate:  97 PR Interval:  188 QRS Duration:  134 QT Interval:  420 QTC Calculation: 533 R Axis:   253  Text Interpretation: Normal sinus rhythm Right bundle branch block Abnormal ECG When compared with ECG of 11-Dec-2022 13:21, PREVIOUS ECG IS PRESENT No significant change was found Confirmed by Drema Pry [phone]) on 05/28/2023 12:48:56 AM       Radiology DG Chest 2 View Result Date: 05/27/2023 CLINICAL DATA:  Chest pain, left arm pain, short of breath EXAM: CHEST - 2 VIEW COMPARISON:  12/11/2022 FINDINGS: Frontal and lateral views of the chest demonstrate an unremarkable cardiac silhouette. No acute airspace disease, effusion, or pneumothorax. No acute bony abnormalities. IMPRESSION: 1. No acute intrathoracic process. Electronically Signed   By: Sharlet Salina M.D.   On: 05/27/2023 16:20    Medications Ordered in  ED Medications  nitroGLYCERIN (NITROSTAT) SL tablet 0.4 mg (has no administration in time range)  albuterol (PROVENTIL) (2.5 MG/3ML) 0.083% nebulizer solution 10 mg (has no administration in time range)  ipratropium (ATROVENT) nebulizer solution 0.5 mg (has no administration in time range)  aspirin chewable tablet 324 mg (324 mg Oral Given 05/27/23 1535)   Procedures .Critical Care  Performed by: Nira Conn, MD Authorized by: Nira Conn, MD   Critical care provider statement:    Critical care time (minutes):  55   Critical care was necessary to treat or prevent imminent or life-threatening deterioration of the following conditions:  Respiratory failure   Critical care was time spent personally by me on the following activities:  Development of treatment plan with patient or surrogate, discussions with consultants, evaluation of patient's response to treatment, examination of patient, ordering and review of laboratory studies, ordering and review of radiographic studies, ordering and performing treatments and interventions, pulse oximetry, re-evaluation of patient's condition and review of old charts   Care discussed with: admitting provider     (including critical care time) Medical Decision Making / ED Course   Medical Decision Making Amount and/or Complexity of Data Reviewed Labs: ordered. Decision-making details documented in ED Course. Radiology: ordered and independent interpretation performed. Decision-making details documented in ED Course. ECG/medicine tests: ordered and independent interpretation performed. Decision-making details documented in ED Course.  Risk OTC drugs. Prescription drug management. Decision regarding hospitalization.    Shortness of breath and CP  Differential diagnosis considered and workup below.  Asthma/COPD exacerbation, CHF exacerbation, ACS PE, pneumonia, pneumothorax, pleural effusions, severe anemia,  tachydysrhythmias.  EKG without acute ischemic changes, dysrhythmias.  Baseline right  bundle branch block.  Serial troponins negative x 2.  ACS unlikely in this clinical picture. BNP normal.  Patient does not appear to be volume overloaded on exam concerning for CHF exacerbation. Chest x-ray without evidence of pneumonia, pneumothorax, pulmonary edema pleural effusions.  CBC with leukocytosis.  No anemia.  CBC actually appears to be hemoconcentrated.  Possibly from overdiuresis. Metabolic panel with mild hypokalemia.  Hyperglycemia without DKA.  Renal sufficiency without AKI.  Bicarb elevated likely diuresing alkalosis.  Dimer below age-adjusted limit - unlikely PE  Provided with continuous albuterol and Atrovent.  Following this she did report some improvement in her work of breathing but still wheezing.  Patient had difficulty ambulating on pulse ox.  Pulse ox remained stable but patient had increased work of breathing.  Additional breathing treatments ordered.  Admitted to medicine for further management of COPD exacerbation.     Final Clinical Impression(s) / ED Diagnoses Final diagnoses:  COPD with acute exacerbation (HCC)    This chart was dictated using voice recognition software.  Despite best efforts to proofread,  errors can occur which can change the documentation meaning.    Nira Conn, MD 05/28/23 [phone]

## 2023-05-28 NOTE — Assessment & Plan Note (Addendum)
 Daily weights Bnp wnl/CXR with no acute findings  Echo 10/12024: normal EF, grade 1 DD Daily weights  AKI on CKD, hold  her lasix/spiro for now  Echo 10/24: normal EF, grade 1 DD

## 2023-05-28 NOTE — Assessment & Plan Note (Addendum)
 Poorly controlled, A1C of 10.3 in 03/2023 Resistant SSI and accuchecks qac/hs  Continue tresiba, but decreased from 110>50 units. She has not had in a few days with poor PO intake  Hold mounjaro while inpatient

## 2023-05-28 NOTE — Assessment & Plan Note (Signed)
 Secondary to high dose lasix daily  Holding lasix/spiro with AKI on CKD Hold home potassium regimen Give now, check  magnesium Trend

## 2023-05-28 NOTE — ED Notes (Signed)
RT called for continuous breathing treatment

## 2023-05-28 NOTE — Assessment & Plan Note (Signed)
 Baseline creatinine 1.1-1.2, now up to 1.7 She took metolazone and has not eaten or drank for the past day since being in Ed.  UA pending  Hold nephrotoxic drugs Diet ordered Strict I/O Trend

## 2023-05-28 NOTE — ED Notes (Signed)
Food tray ordered for the patient.

## 2023-05-28 NOTE — H&P (Signed)
 History and Physical    Patient: Kathleen Hatfield:[id number] DOB: 1956/01/27 DOA: 05/27/2023 DOS: the patient was seen and examined on 05/28/2023 PCP: Joaquim Nam, MD  Patient coming from: Home - lives with her boyfriend. Uses walker at times and walls in home have a chair rail.    Chief Complaint: dyspnea on exertion/shortness of breath and left arm pain.   HPI: Kathleen Hatfield is a 68 y.o. female with medical history significant of T2DM, COPD, diastolic CHF, CKD, HLD and statin intolerant, HTN, RLS, OSA, neuropathy, UC, chronic respiratory failure on 2L oxygen, who presented to ED with worsening dyspnea on exertion/shortness of breath. She states she came to the hospital because her left arm was hurting and it radiated to her shoulder. She felt like she had a brillo pad in her chest. She had not felt anything like this before. She called her doctor whose nurse recommended she come to ED. She states her arm has been hurting x 4 days. She is right handed. Pain is in entire arm and goes from her forearm up into her shoulder. Pain rated as a 9/10 and described as sharp in nature. Pain was constant. Nothing made it better or worse. Her chest complaints started just two days ago and described as a scratching like sensation more in her right chest area. This seemed to be constant, but she doesn't have it now.   Her shortness of breath is worse than normal and she thinks this has gradually gotten worse over the past few weeks. She doesn't feel like her asthma medication has been working. She took her metolazone and this didn't help. She denies any swelling in her legs, but has gained 10 pounds. She has cough, but this is not new. No increased sputum/viscosity. She has dyspnea on exertion, but states some of this is due to her bad knees.    He has been feeling good. Denies any fever/chills, vision changes/headaches, palpitations, abdominal pain, N/V/D, dysuria or leg swelling.    She does not smoke or  drink alcohol.   ER Course:  vitals: afebrile, bp: 159/118, HR: 98, RR: 14, oxygen: 92%RA>98%2L Pertinent labs: wbc: 13.1, hemoglobin: 15.3, potassium: 3.3, CO2: 35, BUN: 26, creatinine: 1.36, d-dimer: .60, covid/flu/rsv: negative CXR: no acute finding In ED: treated for COPD exacerbation with nebs, zofran. TRH asked to admit.   Review of Systems: As mentioned in the history of present illness. All other systems reviewed and are negative. Past Medical History:  Diagnosis Date   Anemia    Arthritis    Asthma    Chronic diastolic CHF 05/2014   Echocardiogram 05/2019: EF 70, no RWMA, mild LVH, Gr 1 DD, normal RVSF, severe LVH, borderline asc Aorta (39 mm)   CKD (chronic kidney disease)    Depression    Diabetes mellitus without complication (HCC)    Diverticulitis    Dyspnea    with exertion   GERD (gastroesophageal reflux disease)    History of blood transfusion    Hyperlipidemia    cannot tolerate statins   Hypertension    patient states she has never had high blood pressure.    Nuclear stress test    Myoview 05/2019: EF 83, no ischemia or infarction; Low Risk   Peripheral neuropathy    Pneumonia    PONV (postoperative nausea and vomiting)    RLS (restless legs syndrome)    Sleep apnea    uses CPAP   Ulcerative colitis (HCC)    dr Loreta Ave  Past Surgical History:  Procedure Laterality Date   ABDOMINAL HYSTERECTOMY     APPENDECTOMY     BIOPSY  10/10/2021   Procedure: BIOPSY;  Surgeon: Jeani Hawking, MD;  Location: [facility];  Service: Gastroenterology;;   BREAST BIOPSY Left    BREAST SURGERY     left biopsy   CARDIAC CATHETERIZATION     CESAREAN SECTION     CHOLECYSTECTOMY     COLONOSCOPY WITH PROPOFOL N/A 10/10/2021   Procedure: COLONOSCOPY WITH PROPOFOL;  Surgeon: Jeani Hawking, MD;  Location: [facility];  Service: Gastroenterology;  Laterality: N/A;   HERNIA REPAIR     INSERTION OF MESH N/A 11/15/2015   Procedure: INSERTION OF MESH;  Surgeon: Karie Soda, MD;   Location: [facility];  Service: General;  Laterality: N/A;   LAPAROSCOPIC LYSIS OF ADHESIONS N/A 11/15/2015   Procedure: LAPAROSCOPIC LYSIS OF ADHESIONS;  Surgeon: Karie Soda, MD;  Location: [facility];  Service: General;  Laterality: N/A;   RIGHT HEART CATHETERIZATION N/A 02/27/2013   Procedure: RIGHT HEART CATH;  Surgeon: Micheline Chapman, MD;  Location: [facility] CATH LAB;  Service: Cardiovascular;  Laterality: N/A;   RIGHT HEART CATHETERIZATION N/A 12/14/2013   Procedure: RIGHT HEART CATH;  Surgeon: Laurey Morale, MD;  Location: [facility] CATH LAB;  Service: Cardiovascular;  Laterality: N/A;   SIGMOIDECTOMY  2010   diverticular disease   VENTRAL HERNIA REPAIR N/A 11/15/2015   Procedure: LAPAROSCOPIC VENTRAL WALL HERNIA REPAIR;  Surgeon: Karie Soda, MD;  Location: [facility];  Service: General;  Laterality: N/A;   Social History:  reports that she has never smoked. She has been exposed to tobacco smoke. She has never used smokeless tobacco. She reports that she does not drink alcohol and does not use drugs.  Allergies  Allergen Reactions   Almond Oil Anaphylaxis, Shortness Of Breath and Swelling   Morphine And Codeine Shortness Of Breath and Other (See Comments)    Pt. States while in the hospital it affected her breathing, O2 dropped to the 70's   Primidone Shortness Of Breath        Atorvastatin Other (See Comments)    Leg weakness   Ceclor [Cefaclor] Nausea And Vomiting   Latex Rash    Material used for CPAP strap caused severe skin irritation.   Not sure if she is allergic to all latex   Sulfa Antibiotics Nausea And Vomiting   Ciprofloxacin Other (See Comments)    Makes joints and muscles ache   Levaquin [Levofloxacin] Other (See Comments)    Body aches   Losartan Other (See Comments)    Weakness    Peanut-Containing Drug Products     Pt states she thinks it is causing gout flare-ups   Repatha [Evolocumab]     Aches   Statins Other (See Comments)    Leg and body weakness    Family  History  Problem Relation Age of Onset   Heart disease Mother    Hypertension Mother    Dementia Mother    Heart disease Father    COPD Father    Hypertension Father    Heart disease Brother    Other Brother        knee replacement; hip replacement   Other Brother        cant brathe when laying down   Diabetes Maternal Grandmother    Breast cancer Neg Hx    Colon cancer Neg Hx     Prior to Admission medications   Medication Sig Start Date  End Date Taking? Authorizing Provider  acetaminophen (TYLENOL) 500 MG tablet Take 1,000 mg by mouth 2 (two) times daily.   Yes [provider]  albuterol (VENTOLIN HFA) 108 (90 Base) MCG/ACT inhaler Inhale 2 puffs into the lungs 2 (two) to 3 (three) times daily as needed for shortness of breath/wheeze. 12/08/22  Yes Joaquim Nam, MD  Alirocumab (PRALUENT) 150 MG/ML SOAJ Inject 1 mL (150 mg total) into the skin every 14 (fourteen) days. 12/08/22  Yes Joaquim Nam, MD  arformoterol (BROVANA) 15 MCG/2ML NEBU Take 2 mLs (15 mcg total) by nebulization 2 (two) times daily. 12/10/22  Yes Cobb, Ruby Cola, NP  budesonide (PULMICORT) 0.5 MG/2ML nebulizer solution Take 2 mLs (0.5 mg total) by nebulization 2 (two) times daily. 12/10/22  Yes Cobb, Ruby Cola, NP  buPROPion (WELLBUTRIN XL) 300 MG 24 hr tablet Take 1 tablet (300 mg total) by mouth in the morning. 11/03/22  Yes Joaquim Nam, MD  Cholecalciferol (VITAMIN D-3) 5000 UNITS TABS Take 5,000 Units by mouth every other day.    Yes [provider]  colchicine 0.6 MG tablet Take 1 tablet (0.6 mg total) by mouth daily as needed for gout. Patient taking differently: Take 0.6 mg by mouth at bedtime. 12/08/22  Yes Joaquim Nam, MD  dicyclomine (BENTYL) 20 MG tablet Take 1 tablet (20 mg total) by mouth 4 (four) times daily as needed for GI cramping, pain, nausea/vomiting. 12/08/22  Yes Joaquim Nam, MD  DULoxetine (CYMBALTA) 60 MG capsule Take 1 capsule (60 mg total) by mouth at  bedtime. 03/11/23  Yes Joaquim Nam, MD  ferrous sulfate 325 (65 FE) MG tablet Take 1 tablet (325 mg total) by mouth every other day. Patient taking differently: Take 325 mg by mouth 2 (two) times a week. Take on Sat and Sund 12/08/22  Yes Joaquim Nam, MD  furosemide (LASIX) 20 MG tablet Take 3 tablets (60 mg total) by mouth 2 (two) times daily. 12/29/22  Yes Tonny Bollman, MD  gabapentin (NEURONTIN) 300 MG capsule Take 1 capsule (300 mg total) by mouth 2 (two) times daily. 12/29/22  Yes Joaquim Nam, MD  insulin aspart (NOVOLOG FLEXPEN) 100 UNIT/ML FlexPen Inject 16 Units into the skin 3 (three) times daily with meals. Per sliding scale.  Max daily 45 units Patient taking differently: Inject 16 Units into the skin 4 (four) times daily -  before meals and at bedtime. 04/09/23  Yes Shamleffer, Konrad Dolores, MD  insulin degludec (TRESIBA FLEXTOUCH) 200 UNIT/ML FlexTouch Pen Inject 110 Units into the skin daily. Patient taking differently: Inject 110 Units into the skin at bedtime. 04/09/23  Yes Shamleffer, Konrad Dolores, MD  Insulin Pen Needle (GLOBAL EASE INJECT PEN NEEDLES) 32G X 4 MM MISC Use for insulin in the morning, at noon, in the evening, and at bedtime. 04/09/23  Yes Shamleffer, Konrad Dolores, MD  ipratropium-albuterol (DUONEB) 0.5-2.5 (3) MG/3ML SOLN Take 3 mLs by nebulization every 6 (six) hours as needed (wheezing). 02/10/21  Yes Coralyn Helling, MD  metolazone (ZAROXOLYN) 5 MG tablet Take 1 tablet (5 mg total) by mouth daily as needed. 05/07/23  Yes Joaquim Nam, MD  nitrofurantoin (MACRODANTIN) 100 MG capsule Take 1 capsule (100 mg total) by mouth daily. Patient taking differently: Take 100 mg by mouth at bedtime. 03/31/23  Yes   ondansetron (ZOFRAN) 4 MG tablet Take 1 tablet (4 mg total) by mouth every 8 (eight) hours as needed for nausea and vomiting 12/29/22  Yes  Joaquim Nam, MD  OXYGEN Inhale 2 L into the lungs continuous.   Yes [provider]  potassium  chloride (MICRO-K) 10 MEQ CR capsule Take 2 capsules (20 mEq total) by mouth in the morning AND 1 capsule (10 mEq total) daily at 12 noon AND 2 capsules (20 mEq total) every evening. 12/08/22  Yes Joaquim Nam, MD  rOPINIRole (REQUIP) 4 MG tablet Take 1 tablet (4 mg total) by mouth at bedtime. 12/08/22  Yes Joaquim Nam, MD  Spacer/Aero-Holding Chambers [facility] DIAMOND) MISC Use as dirtected with inhaler 12/10/22  Yes   spironolactone (ALDACTONE) 25 MG tablet Take 0.5 tablets (12.5 mg total) by mouth daily. 12/30/22  Yes Joaquim Nam, MD  tirzepatide [facility]) 2.5 MG/0.5ML Pen Inject 2.5 mg into the skin once a week. 04/09/23  Yes Shamleffer, Konrad Dolores, MD  traMADol (ULTRAM) 50 MG tablet Take 1 tablet (50 mg total) by mouth 3 (three) times daily as needed. Patient taking differently: Take 50 mg by mouth daily as needed for moderate pain (pain score 4-6). 11/04/22  Yes Joaquim Nam, MD  triamcinolone cream (KENALOG) 0.1 % Apply 1 Application topically 2 (two) times daily as needed. Patient taking differently: Apply 1 Application topically daily. 03/18/23  Yes Joaquim Nam, MD  ketoconazole (NIZORAL) 2 % cream Apply 1 Application topically 2 (two) times daily. 04/29/22   Joaquim Nam, MD  mesalamine (LIALDA) 1.2 g EC tablet Take 2 tablets (2.4 g total) by mouth 2 (two) times daily. Patient not taking: Reported on 05/28/2023 03/01/23       Physical Exam: Vitals:   05/28/23 0510 05/28/23 0512 05/28/23 0600 05/28/23 0758  BP: (!) 115/56  127/70   Pulse: 92 96 95   Resp: 16 17 19    Temp:    98.5 F (36.9 C)  TempSrc:    Oral  SpO2: 93% 99% 98%   Weight:      Height:       General:  Appears calm and comfortable and is in NAD. Obese  Eyes:  PERRL, EOMI, normal lids, iris ENT:  grossly normal hearing, lips & tongue, mmm; appropriate dentition Neck:  no LAD, masses or thyromegaly; no carotid bruits Cardiovascular:  RRR, no m/r/g. No LE edema. +reproducible chest pain  on anterior right chest wall  Respiratory:   CTA bilaterally with no wheezes/rales/rhonchi.  Normal respiratory effort. Abdomen:  soft, NT, ND, NABS Back:   normal alignment, no CVAT Skin:  no rash or induration seen on limited exam Musculoskeletal:  grossly normal tone BUE/BLE, good ROM, no bony abnormality negative left shoulder exam/arm exam. No edema/erythema.  Lower extremity:  No LE edema.  Limited foot exam with no ulcerations.  2+ distal pulses. Psychiatric:  grossly normal mood and affect, speech fluent and appropriate, AOx3 Neurologic:  CN 2-12 grossly intact, moves all extremities in coordinated fashion, sensation intact   Radiological Exams on Admission: Independently reviewed - see discussion in A/P where applicable  DG Chest 2 View Result Date: 05/27/2023 CLINICAL DATA:  Chest pain, left arm pain, short of breath EXAM: CHEST - 2 VIEW COMPARISON:  12/11/2022 FINDINGS: Frontal and lateral views of the chest demonstrate an unremarkable cardiac silhouette. No acute airspace disease, effusion, or pneumothorax. No acute bony abnormalities. IMPRESSION: 1. No acute intrathoracic process. Electronically Signed   By: Sharlet Salina M.D.   On: 05/27/2023 16:20    EKG: Independently reviewed.  NSR with rate 97; nonspecific ST changes with no evidence of  acute ischemia, RBBB. Similar to previous    Labs on Admission: I have personally reviewed the available labs and imaging studies at the time of the admission.  Pertinent labs:   wbc: 13.1>10.1 hemoglobin: 15.3,  potassium: 3.3,  CO2: 35,  BUN: 26,  creatinine: 1.36,  d-dimer: .60,  covid/flu/rsv: negative  Assessment and Plan: Principal Problem:   COPD with acute exacerbation (HCC) Active Problems:   Left arm pain   Acute renal failure superimposed on stage 3a chronic kidney disease (HCC)   Chest pain   Hypokalemia   (HFpEF) heart failure with preserved ejection fraction (HCC)   Diabetes mellitus (HCC)   Essential  hypertension   Recurrent UTI   RLS (restless legs syndrome)   Ulcerative colitis (HCC)   OSA on CPAP   Hyperlipidemia    Assessment and Plan: * COPD with acute exacerbation (HCC) 68 year old presenting to ED with 3 week history of worsening shortness of breath, chest tightness and dyspnea on exertion found to be wheezing and tight on exam consistent with COPD exacerbation  -obs to tele -given nebs in ED with some relief, start steroids IV>PO. NO wheezing on my exam, but still feels tight  -schedule duonebs -continue her home pulmicort, brovana -SABA prn  -pulmonary hygiene  -did not have cpap last night  -chronic respiratory failure on 2L stable  -no increased coughing/viscosity/sputum. No indication for abx -echo in 10/24 with no signs of pulmonary HTN  -d-dimer .60, just at age adjusted and around baseline for her. NO leg swelling/tachycardia or hypoxia  -I think debility also playing a role in her chronic shortness of breath   Left arm pain 4 day history of non traumatic arm pain.  Resolved on exam  Not impressive exam  Check xrays/inflammatory markers/uric acid  May benefit from OT   Acute renal failure superimposed on stage 3a chronic kidney disease (HCC) Baseline creatinine 1.1-1.2, now up to 1.7 She took metolazone and has not eaten or drank for the past day since being in Ed.  UA pending  Hold nephrotoxic drugs Diet ordered Strict I/O Trend   Chest pain Resolved and reproducible on exam  EKG wnl no acute findings, troponin negative x 2 Atypical. Check inflammatory markers Suspect more MSK/costochondritis  Follow on tele   Hypokalemia Secondary to high dose lasix daily  Holding lasix/spiro with AKI on CKD Hold home potassium regimen Give now, check  magnesium Trend   (HFpEF) heart failure with preserved ejection fraction (HCC) Daily weights Bnp wnl/CXR with no acute findings  Echo 10/12024: normal EF, grade 1 DD Daily weights  AKI on CKD, hold   her lasix/spiro for now  Echo 10/24: normal EF, grade 1 DD   Diabetes mellitus (HCC) Poorly controlled, A1C of 10.3 in 03/2023 Resistant SSI and accuchecks qac/hs  Continue tresiba, but decreased from 110>50 units. She has not had in a few days with poor PO intake  Hold mounjaro while inpatient   Essential hypertension Hold aldactone 12.5mg  daily, lasix 60mg  BID due to AKI on CKD  PRN hydralazine   Recurrent UTI Hold macrobid in setting of AKI on CKD   RLS (restless legs syndrome) Continue requip  Ulcerative colitis (HCC) Followed by Dr. Sol Passer on hold per patient  No diarrhea or flair   OSA on CPAP Continue cpap at night   Hyperlipidemia Statin intolerant. Praluent q 14 days     Advance Care Planning:   Code Status: Full Code   Consults: none  DVT Prophylaxis: lovenox   Family Communication: none   Severity of Illness: The appropriate patient status for this patient is OBSERVATION. Observation status is judged to be reasonable and necessary in order to provide the required intensity of service to ensure the patient's safety. The patient's presenting symptoms, physical exam findings, and initial radiographic and laboratory data in the context of their medical condition is felt to place them at decreased risk for further clinical deterioration. Furthermore, it is anticipated that the patient will be medically stable for discharge from the hospital within 2 midnights of admission.   Author: Orland Mustard, MD 05/28/2023 12:42 PM  For on call review [email or web address].

## 2023-05-28 NOTE — Assessment & Plan Note (Signed)
 4 day history of non traumatic arm pain.  Resolved on exam  Not impressive exam  Check xrays/inflammatory markers/uric acid  May benefit from OT

## 2023-05-28 NOTE — ED Notes (Signed)
 Lab states that VBG needs to be redrawn, the sample is to old.

## 2023-05-28 NOTE — Assessment & Plan Note (Addendum)
 Followed by Dr. Sol Passer on hold per patient  No diarrhea or flair

## 2023-05-29 DIAGNOSIS — E1165 Type 2 diabetes mellitus with hyperglycemia: Secondary | ICD-10-CM | POA: Diagnosis not present

## 2023-05-29 DIAGNOSIS — E1122 Type 2 diabetes mellitus with diabetic chronic kidney disease: Secondary | ICD-10-CM | POA: Diagnosis not present

## 2023-05-29 DIAGNOSIS — Z6841 Body Mass Index (BMI) 40.0 and over, adult: Secondary | ICD-10-CM | POA: Diagnosis not present

## 2023-05-29 DIAGNOSIS — N390 Urinary tract infection, site not specified: Secondary | ICD-10-CM | POA: Diagnosis not present

## 2023-05-29 DIAGNOSIS — F32A Depression, unspecified: Secondary | ICD-10-CM | POA: Diagnosis not present

## 2023-05-29 DIAGNOSIS — N1831 Chronic kidney disease, stage 3a: Secondary | ICD-10-CM | POA: Diagnosis not present

## 2023-05-29 DIAGNOSIS — Z794 Long term (current) use of insulin: Secondary | ICD-10-CM | POA: Diagnosis not present

## 2023-05-29 DIAGNOSIS — E876 Hypokalemia: Secondary | ICD-10-CM | POA: Diagnosis not present

## 2023-05-29 DIAGNOSIS — E860 Dehydration: Secondary | ICD-10-CM | POA: Diagnosis not present

## 2023-05-29 DIAGNOSIS — N179 Acute kidney failure, unspecified: Secondary | ICD-10-CM | POA: Diagnosis present

## 2023-05-29 DIAGNOSIS — J9611 Chronic respiratory failure with hypoxia: Secondary | ICD-10-CM | POA: Diagnosis not present

## 2023-05-29 DIAGNOSIS — Z1152 Encounter for screening for COVID-19: Secondary | ICD-10-CM | POA: Diagnosis not present

## 2023-05-29 DIAGNOSIS — E785 Hyperlipidemia, unspecified: Secondary | ICD-10-CM | POA: Diagnosis not present

## 2023-05-29 DIAGNOSIS — E1142 Type 2 diabetes mellitus with diabetic polyneuropathy: Secondary | ICD-10-CM | POA: Diagnosis not present

## 2023-05-29 DIAGNOSIS — K5190 Ulcerative colitis, unspecified, without complications: Secondary | ICD-10-CM | POA: Diagnosis not present

## 2023-05-29 DIAGNOSIS — R5381 Other malaise: Secondary | ICD-10-CM | POA: Diagnosis not present

## 2023-05-29 DIAGNOSIS — I130 Hypertensive heart and chronic kidney disease with heart failure and stage 1 through stage 4 chronic kidney disease, or unspecified chronic kidney disease: Secondary | ICD-10-CM | POA: Diagnosis not present

## 2023-05-29 DIAGNOSIS — G4733 Obstructive sleep apnea (adult) (pediatric): Secondary | ICD-10-CM | POA: Diagnosis not present

## 2023-05-29 DIAGNOSIS — E66813 Obesity, class 3: Secondary | ICD-10-CM | POA: Diagnosis not present

## 2023-05-29 DIAGNOSIS — I5032 Chronic diastolic (congestive) heart failure: Secondary | ICD-10-CM | POA: Diagnosis not present

## 2023-05-29 DIAGNOSIS — Z87440 Personal history of urinary (tract) infections: Secondary | ICD-10-CM | POA: Diagnosis not present

## 2023-05-29 DIAGNOSIS — J441 Chronic obstructive pulmonary disease with (acute) exacerbation: Secondary | ICD-10-CM | POA: Diagnosis not present

## 2023-05-29 DIAGNOSIS — G2581 Restless legs syndrome: Secondary | ICD-10-CM | POA: Diagnosis not present

## 2023-05-29 DIAGNOSIS — M79602 Pain in left arm: Secondary | ICD-10-CM | POA: Diagnosis not present

## 2023-05-29 LAB — BASIC METABOLIC PANEL
Anion gap: 14 (ref 5–15)
BUN: 47 mg/dL — ABNORMAL HIGH (ref 8–23)
CO2: 32 mmol/L (ref 22–32)
Calcium: 9.6 mg/dL (ref 8.9–10.3)
Chloride: 90 mmol/L — ABNORMAL LOW (ref 98–111)
Creatinine, Ser: 1.8 mg/dL — ABNORMAL HIGH (ref 0.44–1.00)
GFR, Estimated: 30 mL/min — ABNORMAL LOW (ref >60)
Glucose, Bld: 321 mg/dL — ABNORMAL HIGH (ref 70–99)
Potassium: 4 mmol/L (ref 3.5–5.1)
Sodium: 136 mmol/L (ref 135–145)

## 2023-05-29 LAB — CBC
HCT: 39.8 % (ref 36.0–46.0)
Hemoglobin: 13.4 g/dL (ref 12.0–15.0)
MCH: 30.9 pg (ref 26.0–34.0)
MCHC: 33.7 g/dL (ref 30.0–36.0)
MCV: 91.9 fL (ref 80.0–100.0)
Platelets: 241 10*3/uL (ref 150–400)
RBC: 4.33 MIL/uL (ref 3.87–5.11)
RDW: 13.4 % (ref 11.5–15.5)
WBC: 13.7 10*3/uL — ABNORMAL HIGH (ref 4.0–10.5)
nRBC: 0 % (ref 0.0–0.2)

## 2023-05-29 LAB — GLUCOSE, CAPILLARY
Glucose-Capillary: 310 mg/dL — ABNORMAL HIGH (ref 70–99)
Glucose-Capillary: 349 mg/dL — ABNORMAL HIGH (ref 70–99)
Glucose-Capillary: 325 mg/dL — ABNORMAL HIGH (ref 70–99)
Glucose-Capillary: 405 mg/dL — ABNORMAL HIGH (ref 70–99)
Glucose-Capillary: 432 mg/dL — ABNORMAL HIGH (ref 70–99)

## 2023-05-29 LAB — GLUCOSE, RANDOM: Glucose, Bld: 398 mg/dL — ABNORMAL HIGH (ref 70–99)

## 2023-05-29 MED ORDER — INSULIN GLARGINE 100 UNIT/ML ~~LOC~~ SOLN
65.0000 [IU] | Freq: Every day | SUBCUTANEOUS | Status: DC
  Administered 2023-05-29 – 2023-05-30 (×2): 65 [IU] via SUBCUTANEOUS
  Filled 2023-05-29 (×3): qty 0.65

## 2023-05-29 MED ORDER — SODIUM CHLORIDE 0.9 % IV SOLN: INTRAVENOUS | Status: DC

## 2023-05-29 NOTE — Plan of Care (Signed)

## 2023-05-29 NOTE — Progress Notes (Signed)
 PROGRESS NOTE  Kathleen Hatfield  ZOX:[id number] DOB: 1955/04/05 DOA: 05/27/2023 PCP: Joaquim Nam, MD  Consultants  Brief Narrative: 69 y.o. female with medical history significant of T2DM, COPD, diastolic CHF, CKD, HLD and statin intolerant, HTN, RLS, OSA, neuropathy, UC, chronic respiratory failure on 2L oxygen, who presented to ED with worsening dyspnea on exertion/shortness of breath. She states she came to the hospital because her left arm was hurting and it radiated to her shoulder. She felt like she had a brillo pad in her chest.  Manage due to persistent dyspnea on exertion.  Also reports "shakiness" which has been present for about 3 years 10 for which "no one has an answer"  Assessment & Plan: * COPD with acute exacerbation (HCC) - working diagnosis for constellation of wheezing and tightness on exam. -Continue steroids and nebulizer.  She reports to me today that she has no further wheezing but continues to "feel tight in my chest." -Continue home long-acting inhalers. -Continue home CPAP. -Continue home oxygen. -Agree that debility likely playing a great deal into her chronic shortness of breath including the acute exacerbation.   Left arm pain -Has resolved this morning.  Acute renal failure superimposed on stage 3a chronic kidney disease (HCC) Baseline creatinine 1.1-1.2, now uptrending She took metolazone and has not eaten or drank for the past day since being in Ed.  Starting IV fluids to help with hydration.  She did take metolazone on her own and therefore eating and not likely will be helpful. -Recheck creatinine in the morning.   Chest pain Resolved today. -Likely musculoskeletal. -Troponins negative x 2.   Hypokalemia Secondary to high dose lasix daily, metolazone use.plus  Holding lasix/spiro with AKI on CKD Trend.   (HFpEF) heart failure with preserved ejection fraction (HCC) Daily weights Bnp wnl/CXR with no acute findings  Echo 10/12024: normal EF, grade  1 DD Daily weights  AKI on CKD, hold  her lasix/spiro for now     Diabetes mellitus (HCC) Poorly controlled, A1C of 10.3 in 03/2023 Resistant SSI and accuchecks qac/hs  Continue tresiba, but decreased from 110>50 units on admission.  CBGs have been uptrending, will increase dose.   Hold mounjaro while inpatient    Essential hypertension Hold aldactone 12.5mg  daily, lasix 60mg  BID due to AKI on CKD  PRN hydralazine    Recurrent UTI Hold macrobid in setting of AKI on CKD  Unclear if actual UTI, await cultures   RLS (restless legs syndrome) Continue requip   Ulcerative colitis (HCC) Followed by Dr. Sol Passer on hold per patient  No diarrhea or flair, no issues or concerns.   OSA on CPAP Continue cpap at night    Hyperlipidemia Statin intolerant. Praluent q 14 days    Deconditioning: -Agree this is likely playing great deal into problem #1 above.  Reports that inability to take deep breaths prevents her from much physical activity which starts a vicious downward spiral.     DVT prophylaxis: Lovenox   Code Status:   Code Status: Full Code Level of care: Telemetry Medical Status is: Inpatient   Subjective: Patient sleeping on my exam and easily awakens.  Still feels "chest tightness" throughout chest.  Denies any actual chest pressure or pain.  Reports that she has trouble taking a full deep breath.  Reports that nebulizers are only occasionally helpful.  Denies any other concerns or complaints.  Objective: Vitals:   05/29/23 0745 05/29/23 1150 05/29/23 1300 05/29/23 1627  BP: 107/69 124/63  (!) 138/58  Pulse: 91 90  87  Resp: 18 19  19   Temp: 98 F (36.7 C) 98 F (36.7 C)  98.4 F (36.9 C)  TempSrc: Oral Oral    SpO2: 91% 100% 98% 97%  Weight:      Height:        Intake/Output Summary (Last 24 hours) at 05/29/2023 1723 Last data filed at 05/29/2023 1722 Gross per 24 hour  Intake 1068.97 ml  Output --  Net 1068.97 ml   Filed Weights   05/27/23 1452  05/29/23 0500  Weight: 117 kg 120.6 kg   Body mass index is 44.24 kg/m.  Gen: 68 y.o. female in no apparent distress.  Nontoxic Pulm: Not able to move air fairly well but more secondary to deconditioning rather than intrinsic lung issue.  No wheezing or adventitious breath sounds CV: Regular rate and rhythm. No murmur, rub, or gallop. No JVD GI: Abdomen soft, non-tender, non-distended, with normoactive bowel sounds. No organomegaly or masses felt. Ext: Warm, no deformities, no pedal edema Skin: No rashes, lesions  Neuro: Alert and oriented. No focal neurological deficits. Psych: Calm  Judgement and insight appear normal. Mood & affect appropriate.     I have personally reviewed the following labs and images: CBC: Recent Labs  Lab 05/27/23 1527 05/28/23 1034 05/29/23 0647  WBC 13.1* 10.1 13.7*  NEUTROABS 6.9  --   --   HGB 15.3* 14.4 13.4  HCT 47.0* 44.2 39.8  MCV 93.3 95.1 91.9  PLT 271 230 241   BMP &GFR Recent Labs  Lab 05/27/23 1527 05/28/23 1034 05/28/23 1536 05/29/23 0647  NA 137 141  --  136  K 3.3* 3.1*  --  4.0  CL 88* 90*  --  90*  CO2 35* 35*  --  32  GLUCOSE 197* 193*  --  321*  BUN 26* 34*  --  47*  CREATININE 1.36* 1.72*  --  1.80*  CALCIUM 10.6* 10.0  --  9.6  MG  --   --  1.9  --    Estimated Creatinine Clearance: 38.9 mL/min (A) (by C-G formula based on SCr of 1.8 mg/dL (H)). Liver & Pancreas: No results for input(s): "AST", "ALT", "ALKPHOS", "BILITOT", "PROT", "ALBUMIN" in the last 168 hours. No results for input(s): "LIPASE", "AMYLASE" in the last 168 hours. No results for input(s): "AMMONIA" in the last 168 hours. Diabetic: No results for input(s): "HGBA1C" in the last 72 hours. Recent Labs  Lab 05/28/23 2056 05/29/23 0838 05/29/23 1003 05/29/23 1147 05/29/23 1624  GLUCAP 408* 310* 349* 325* 405*   Cardiac Enzymes: No results for input(s): "CKTOTAL", "CKMB", "CKMBINDEX", "TROPONINI" in the last 168 hours. Recent Labs     09/21/22 1135 11/23/22 1200  PROBNP 45.0 43.0   Coagulation Profile: No results for input(s): "INR", "PROTIME" in the last 168 hours. Thyroid Function Tests: Recent Labs    05/28/23 1449  TSH 2.371   Lipid Profile: No results for input(s): "CHOL", "HDL", "LDLCALC", "TRIG", "CHOLHDL", "LDLDIRECT" in the last 72 hours. Anemia Panel: No results for input(s): "VITAMINB12", "FOLATE", "FERRITIN", "TIBC", "IRON", "RETICCTPCT" in the last 72 hours. Urine analysis:    Component Value Date/Time   COLORURINE YELLOW 08/14/2022 2025   APPEARANCEUR CLEAR 08/14/2022 2025   LABSPEC 1.013 08/14/2022 2025   PHURINE 6.0 08/14/2022 2025   GLUCOSEU >=500 (A) 08/14/2022 2025   HGBUR NEGATIVE 08/14/2022 2025   BILIRUBINUR negative 09/17/2022 1803   BILIRUBINUR negative 12/25/2019 1515   KETONESUR negative 09/17/2022 1803  KETONESUR NEGATIVE 08/14/2022 2025   PROTEINUR negative 09/17/2022 1803   PROTEINUR NEGATIVE 08/14/2022 2025   UROBILINOGEN 0.2 09/17/2022 1803   UROBILINOGEN 0.2 09/19/2008 1400   NITRITE Negative 09/17/2022 1803   NITRITE NEGATIVE 08/14/2022 2025   LEUKOCYTESUR Trace (A) 09/17/2022 1803   LEUKOCYTESUR MODERATE (A) 08/14/2022 2025   Sepsis Labs: Invalid input(s): "PROCALCITONIN", "LACTICIDVEN"  Microbiology: Recent Results (from the past 240 hours)  Resp panel by RT-PCR (RSV, Flu A&B, Covid) Anterior Nasal Swab     Status: None   Collection Time: 05/28/23  5:38 AM   Specimen: Anterior Nasal Swab  Result Value Ref Range Status   SARS Coronavirus 2 by RT PCR NEGATIVE NEGATIVE Final   Influenza A by PCR NEGATIVE NEGATIVE Final   Influenza B by PCR NEGATIVE NEGATIVE Final    Comment: (NOTE) The Xpert Xpress SARS-CoV-2/FLU/RSV plus assay is intended as an aid in the diagnosis of influenza from Nasopharyngeal swab specimens and should not be used as a sole basis for treatment. Nasal washings and aspirates are unacceptable for Xpert Xpress  SARS-CoV-2/FLU/RSV testing.  Fact Sheet for Patients: BloggerCourse.com  Fact Sheet for Healthcare Providers: SeriousBroker.it  This test is not yet approved or cleared by the [place] FDA and has been authorized for detection and/or diagnosis of SARS-CoV-2 by FDA under an Emergency Use Authorization (EUA). This EUA will remain in effect (meaning this test can be used) for the duration of the COVID-19 declaration under Section 564(b)(1) of the Act, 21 U.S.C. section 360bbb-3(b)(1), unless the authorization is terminated or revoked.     Resp Syncytial Virus by PCR NEGATIVE NEGATIVE Final    Comment: (NOTE) Fact Sheet for Patients: BloggerCourse.com  Fact Sheet for Healthcare Providers: SeriousBroker.it  This test is not yet approved or cleared by the [place] FDA and has been authorized for detection and/or diagnosis of SARS-CoV-2 by FDA under an Emergency Use Authorization (EUA). This EUA will remain in effect (meaning this test can be used) for the duration of the COVID-19 declaration under Section 564(b)(1) of the Act, 21 U.S.C. section 360bbb-3(b)(1), unless the authorization is terminated or revoked.  Performed at [facility], [address] [place], [place] [address]     Radiology Studies: No results found.  Scheduled Meds:  arformoterol  15 mcg Nebulization BID   budesonide  0.5 mg Nebulization BID   buPROPion  300 mg Oral q AM   DULoxetine  60 mg Oral QHS   enoxaparin (LOVENOX) injection  60 mg Subcutaneous Daily   [START ON 05/31/2023] ferrous sulfate  325 mg Oral Once per day on Monday Thursday   gabapentin  300 mg Oral BID   insulin aspart  0-20 Units Subcutaneous TID WC   insulin glargine  55 Units Subcutaneous QHS   predniSONE  40 mg Oral Q breakfast   rOPINIRole  4 mg Oral QHS   Continuous Infusions:  sodium chloride 100 mL/hr at  05/29/23 1722   albuterol Stopped (05/28/23 0400)     LOS: 0 days   35 minutes with more than 50% spent in reviewing records, counseling patient/family and coordinating care.  Tobey Grim, MD [facility] [email or web address] 05/29/2023, 5:23 PM

## 2023-05-29 NOTE — Plan of Care (Signed)
  Problem: Coping: Goal: Ability to adjust to condition or change in health will improve Outcome: Progressing   Problem: Fluid Volume: Goal: Ability to maintain a balanced intake and output will improve Outcome: Progressing   Problem: Health Behavior/Discharge Planning: Goal: Ability to identify and utilize available resources and services will improve Outcome: Progressing Goal: Ability to manage health-related needs will improve Outcome: Progressing   Problem: Nutritional: Goal: Maintenance of adequate nutrition will improve Outcome: Progressing Goal: Progress toward achieving an optimal weight will improve Outcome: Progressing

## 2023-05-29 NOTE — Care Management Obs Status (Signed)
 MEDICARE OBSERVATION STATUS NOTIFICATION   Patient Details  Name: Kathleen Hatfield MRN: [id number] Date of Birth: January 08, 1956   Medicare Observation Status Notification Given:  Yes    Isaias Cowman, RN 05/29/2023, 9:07 AM

## 2023-05-30 DIAGNOSIS — J441 Chronic obstructive pulmonary disease with (acute) exacerbation: Secondary | ICD-10-CM | POA: Diagnosis not present

## 2023-05-30 LAB — CBC
HCT: 40 % (ref 36.0–46.0)
Hemoglobin: 13.5 g/dL (ref 12.0–15.0)
MCH: 31.1 pg (ref 26.0–34.0)
MCHC: 33.8 g/dL (ref 30.0–36.0)
MCV: 92.2 fL (ref 80.0–100.0)
Platelets: 219 10*3/uL (ref 150–400)
RBC: 4.34 MIL/uL (ref 3.87–5.11)
RDW: 13.7 % (ref 11.5–15.5)
WBC: 13.9 10*3/uL — ABNORMAL HIGH (ref 4.0–10.5)
nRBC: 0 % (ref 0.0–0.2)

## 2023-05-30 LAB — BASIC METABOLIC PANEL
Anion gap: 10 (ref 5–15)
BUN: 35 mg/dL — ABNORMAL HIGH (ref 8–23)
CO2: 32 mmol/L (ref 22–32)
Calcium: 9.5 mg/dL (ref 8.9–10.3)
Chloride: 95 mmol/L — ABNORMAL LOW (ref 98–111)
Creatinine, Ser: 1.11 mg/dL — ABNORMAL HIGH (ref 0.44–1.00)
GFR, Estimated: 54 mL/min — ABNORMAL LOW (ref >60)
Glucose, Bld: 185 mg/dL — ABNORMAL HIGH (ref 70–99)
Potassium: 3.5 mmol/L (ref 3.5–5.1)
Sodium: 137 mmol/L (ref 135–145)

## 2023-05-30 LAB — GLUCOSE, CAPILLARY
Glucose-Capillary: 213 mg/dL — ABNORMAL HIGH (ref 70–99)
Glucose-Capillary: 278 mg/dL — ABNORMAL HIGH (ref 70–99)
Glucose-Capillary: 396 mg/dL — ABNORMAL HIGH (ref 70–99)
Glucose-Capillary: 388 mg/dL — ABNORMAL HIGH (ref 70–99)

## 2023-05-30 MED ORDER — FUROSEMIDE 40 MG PO TABS
60.0000 mg | ORAL_TABLET | Freq: Two times a day (BID) | ORAL | Status: DC
  Administered 2023-05-30 – 2023-05-31 (×2): 60 mg via ORAL
  Filled 2023-05-30 (×2): qty 1

## 2023-05-30 NOTE — Progress Notes (Signed)
   05/30/23 2016  BiPAP/CPAP/SIPAP  BiPAP/CPAP/SIPAP Pt Type Adult  BiPAP/CPAP/SIPAP Resmed (pt has home CPAP and no assistance needed,)  BiPAP/CPAP /SiPAP Vitals  Pulse Rate 100  Resp 20  SpO2 96 %  Bilateral Breath Sounds Clear;Diminished

## 2023-05-30 NOTE — Plan of Care (Signed)
  Problem: Coping: Goal: Ability to adjust to condition or change in health will improve Outcome: Progressing   Problem: Fluid Volume: Goal: Ability to maintain a balanced intake and output will improve Outcome: Progressing   Problem: Health Behavior/Discharge Planning: Goal: Ability to identify and utilize available resources and services will improve Outcome: Progressing Goal: Ability to manage health-related needs will improve Outcome: Progressing   Problem: Metabolic: Goal: Ability to maintain appropriate glucose levels will improve Outcome: Progressing   Problem: Nutritional: Goal: Maintenance of adequate nutrition will improve Outcome: Progressing Goal: Progress toward achieving an optimal weight will improve Outcome: Progressing   Problem: Skin Integrity: Goal: Risk for impaired skin integrity will decrease Outcome: Progressing   Problem: Tissue Perfusion: Goal: Adequacy of tissue perfusion will improve Outcome: Progressing   Problem: Education: Goal: Knowledge of General Education information will improve Description: Including pain rating scale, medication(s)/side effects and non-pharmacologic comfort measures Outcome: Progressing   Problem: Health Behavior/Discharge Planning: Goal: Ability to manage health-related needs will improve Outcome: Progressing   Problem: Clinical Measurements: Goal: Ability to maintain clinical measurements within normal limits will improve Outcome: Progressing Goal: Will remain free from infection Outcome: Progressing Goal: Diagnostic test results will improve Outcome: Progressing Goal: Respiratory complications will improve Outcome: Progressing Goal: Cardiovascular complication will be avoided Outcome: Progressing   Problem: Activity: Goal: Risk for activity intolerance will decrease Outcome: Progressing   Problem: Nutrition: Goal: Adequate nutrition will be maintained Outcome: Progressing   Problem: Coping: Goal:  Level of anxiety will decrease Outcome: Progressing   Problem: Elimination: Goal: Will not experience complications related to bowel motility Outcome: Progressing Goal: Will not experience complications related to urinary retention Outcome: Progressing   Problem: Pain Managment: Goal: General experience of comfort will improve and/or be controlled Outcome: Progressing   Problem: Safety: Goal: Ability to remain free from injury will improve Outcome: Progressing   Problem: Skin Integrity: Goal: Risk for impaired skin integrity will decrease Outcome: Progressing   Problem: Education: Goal: Knowledge of disease or condition will improve Outcome: Progressing Goal: Knowledge of the prescribed therapeutic regimen will improve Outcome: Progressing Goal: Individualized Educational Video(s) Outcome: Progressing   Problem: Activity: Goal: Ability to tolerate increased activity will improve Outcome: Progressing Goal: Will verbalize the importance of balancing activity with adequate rest periods Outcome: Progressing   Problem: Respiratory: Goal: Ability to maintain a clear airway will improve Outcome: Progressing Goal: Levels of oxygenation will improve Outcome: Progressing Goal: Ability to maintain adequate ventilation will improve Outcome: Progressing

## 2023-05-30 NOTE — Progress Notes (Addendum)
 PROGRESS NOTE  Kathleen Hatfield  ZOX:[id number] DOB: 12/06/1955 DOA: 05/27/2023 PCP: Joaquim Nam, MD  Consultants  Brief Narrative: 68 y.o. female with medical history significant of T2DM, COPD, diastolic CHF, CKD, HLD and statin intolerant, HTN, RLS, OSA, neuropathy, UC, chronic respiratory failure on 2L oxygen, who presented to ED with worsening dyspnea on exertion/shortness of breath. She states she came to the hospital because her left arm was hurting and it radiated to her shoulder. She felt like she had a brillo pad in her chest.  Manage due to persistent dyspnea on exertion.  Also reports "shakiness" which has been present for about 3 years for which "no one has an answer"  Assessment & Plan: * COPD with acute exacerbation (HCC) - working diagnosis for constellation of wheezing and tightness on exam. - better today than it has been -Continue steroids and nebulizer.   -Continue home long-acting inhalers. -Continue home CPAP. -Continue home oxygen. -Agree that debility likely playing a great deal into her chronic shortness of breath including the acute exacerbation.   Left arm pain -Has resolved this morning.  Acute renal failure superimposed on stage 3a chronic kidney disease (HCC) Resolve s/p IV fluids.  She takes Lasix regularly for her diastolic heart failure. -No signs of fluid overload on exam currently despite IV fluids.  I think she was just dehydrated.  She reports that her urine has gone from dark to light yellow. -Restart home Lasix today.   Chest pain Resolved -Likely musculoskeletal. -Troponins negative x 2.   Hypokalemia Secondary to high dose lasix daily, metolazone use.plus  Holding lasix/spiro with AKI on CKD Trend.   (HFpEF) heart failure with preserved ejection fraction (HCC) Daily weights.  Restarting home Lasix as above. Bnp wnl/CXR with no acute findings  Echo 10/12024: normal EF, grade 1 DD No signs of fluid overload.  Do not believe this is playing  into her dyspnea currently.  Leukocytosis: -Spurious, secondary to prednisone use.   Diabetes mellitus (HCC) Poorly controlled, A1C of 10.3 in 03/2023 Resistant SSI and accuchecks qac/hs  Continue tresiba, but decreased from 110>50 units on admission.  CBGs have been uptrending, will increase dose of long-acting insulin. Hold mounjaro while inpatient    Essential hypertension Restarting her home diuretics.  PRN hydralazine    Recurrent UTI Hold macrobid in setting of AKI on CKD  Unclear if actual UTI, await cultures   RLS (restless legs syndrome) Continue requip   Ulcerative colitis (HCC) Followed by Dr. Sol Passer on hold per patient  No diarrhea or flair, no issues or concerns.   OSA on CPAP Continue cpap at night    Hyperlipidemia Statin intolerant. Praluent q 14 days    Deconditioning: -Agree this is likely playing great deal into problem #1 above.  Reports that inability to take deep breaths prevents her from much physical activity which starts a vicious downward spiral.     DVT prophylaxis: Lovenox   Code Status:   Code Status: Full Code Level of care: Telemetry Medical Status is: Inpatient Dispo: Likely able to DC home tomorrow a.m.  Subjective: Patient is awake and alert.  Feels that her breathing has improved today.  Not quite to her baseline yet.  Notices that her urine is lighter today than it has been.  Denies any swelling in her legs.  Objective: Vitals:   05/29/23 2038 05/30/23 0509 05/30/23 0855 05/30/23 1603  BP: 126/69 (!) 126/53 114/66 (!) 165/74  Pulse: 89 73 87 91  Resp: 18 16  16 18  Temp: 97.7 F (36.5 C) (!) 97.3 F (36.3 C)    TempSrc: Oral Oral    SpO2: 94% 97% 93% 95%  Weight:      Height:        Intake/Output Summary (Last 24 hours) at 05/30/2023 1617 Last data filed at 05/30/2023 1513 Gross per 24 hour  Intake 2656.58 ml  Output 1950 ml  Net 706.58 ml   Filed Weights   05/27/23 1452 05/29/23 0500  Weight: 117 kg 120.6 kg    Body mass index is 44.24 kg/m.  Gen: 68 y.o. female in no apparent distress.  Nontoxic Pulm: Not able to move air fairly well but more secondary to deconditioning rather than intrinsic lung issue.  No wheezing or adventitious breath sounds CV: Regular rate and rhythm. No murmur, rub, or gallop. No JVD GI: Abdomen soft, non-tender, non-distended, Ext: Warm, no deformities, no pedal edema Skin: No rashes, lesions  Neuro: Alert and oriented. No focal neurological deficits. Psych: Calm  Judgement and insight appear normal. Mood & affect appropriate.     I have personally reviewed the following labs and images: CBC: Recent Labs  Lab 05/27/23 1527 05/28/23 1034 05/29/23 0647 05/30/23 0837  WBC 13.1* 10.1 13.7* 13.9*  NEUTROABS 6.9  --   --   --   HGB 15.3* 14.4 13.4 13.5  HCT 47.0* 44.2 39.8 40.0  MCV 93.3 95.1 91.9 92.2  PLT 271 230 241 219   BMP &GFR Recent Labs  Lab 05/27/23 1527 05/28/23 1034 05/28/23 1536 05/29/23 0647 05/29/23 1704 05/30/23 0837  NA 137 141  --  136  --  137  K 3.3* 3.1*  --  4.0  --  3.5  CL 88* 90*  --  90*  --  95*  CO2 35* 35*  --  32  --  32  GLUCOSE 197* 193*  --  321* 398* 185*  BUN 26* 34*  --  47*  --  35*  CREATININE 1.36* 1.72*  --  1.80*  --  1.11*  CALCIUM 10.6* 10.0  --  9.6  --  9.5  MG  --   --  1.9  --   --   --    Estimated Creatinine Clearance: 63.1 mL/min (A) (by C-G formula based on SCr of 1.11 mg/dL (H)). Liver & Pancreas: No results for input(s): "AST", "ALT", "ALKPHOS", "BILITOT", "PROT", "ALBUMIN" in the last 168 hours. No results for input(s): "LIPASE", "AMYLASE" in the last 168 hours. No results for input(s): "AMMONIA" in the last 168 hours. Diabetic: No results for input(s): "HGBA1C" in the last 72 hours. Recent Labs  Lab 05/29/23 1624 05/29/23 2038 05/30/23 0845 05/30/23 1246 05/30/23 1603  GLUCAP 405* 432* 213* 278* 396*   Cardiac Enzymes: No results for input(s): "CKTOTAL", "CKMB", "CKMBINDEX",  "TROPONINI" in the last 168 hours. Recent Labs    09/21/22 1135 11/23/22 1200  PROBNP 45.0 43.0   Coagulation Profile: No results for input(s): "INR", "PROTIME" in the last 168 hours. Thyroid Function Tests: Recent Labs    05/28/23 1449  TSH 2.371   Lipid Profile: No results for input(s): "CHOL", "HDL", "LDLCALC", "TRIG", "CHOLHDL", "LDLDIRECT" in the last 72 hours. Anemia Panel: No results for input(s): "VITAMINB12", "FOLATE", "FERRITIN", "TIBC", "IRON", "RETICCTPCT" in the last 72 hours. Urine analysis:    Component Value Date/Time   COLORURINE YELLOW 08/14/2022 2025   APPEARANCEUR CLEAR 08/14/2022 2025   LABSPEC 1.013 08/14/2022 2025   PHURINE 6.0 08/14/2022 2025  GLUCOSEU >=500 (A) 08/14/2022 2025   HGBUR NEGATIVE 08/14/2022 2025   BILIRUBINUR negative 09/17/2022 1803   BILIRUBINUR negative 12/25/2019 1515   KETONESUR negative 09/17/2022 1803   KETONESUR NEGATIVE 08/14/2022 2025   PROTEINUR negative 09/17/2022 1803   PROTEINUR NEGATIVE 08/14/2022 2025   UROBILINOGEN 0.2 09/17/2022 1803   UROBILINOGEN 0.2 09/19/2008 1400   NITRITE Negative 09/17/2022 1803   NITRITE NEGATIVE 08/14/2022 2025   LEUKOCYTESUR Trace (A) 09/17/2022 1803   LEUKOCYTESUR MODERATE (A) 08/14/2022 2025   Sepsis Labs: Invalid input(s): "PROCALCITONIN", "LACTICIDVEN"  Microbiology: Recent Results (from the past 240 hours)  Resp panel by RT-PCR (RSV, Flu A&B, Covid) Anterior Nasal Swab     Status: None   Collection Time: 05/28/23  5:38 AM   Specimen: Anterior Nasal Swab  Result Value Ref Range Status   SARS Coronavirus 2 by RT PCR NEGATIVE NEGATIVE Final   Influenza A by PCR NEGATIVE NEGATIVE Final   Influenza B by PCR NEGATIVE NEGATIVE Final    Comment: (NOTE) The Xpert Xpress SARS-CoV-2/FLU/RSV plus assay is intended as an aid in the diagnosis of influenza from Nasopharyngeal swab specimens and should not be used as a sole basis for treatment. Nasal washings and aspirates are  unacceptable for Xpert Xpress SARS-CoV-2/FLU/RSV testing.  Fact Sheet for Patients: BloggerCourse.com  Fact Sheet for Healthcare Providers: SeriousBroker.it  This test is not yet approved or cleared by the [place] FDA and has been authorized for detection and/or diagnosis of SARS-CoV-2 by FDA under an Emergency Use Authorization (EUA). This EUA will remain in effect (meaning this test can be used) for the duration of the COVID-19 declaration under Section 564(b)(1) of the Act, 21 U.S.C. section 360bbb-3(b)(1), unless the authorization is terminated or revoked.     Resp Syncytial Virus by PCR NEGATIVE NEGATIVE Final    Comment: (NOTE) Fact Sheet for Patients: BloggerCourse.com  Fact Sheet for Healthcare Providers: SeriousBroker.it  This test is not yet approved or cleared by the [place] FDA and has been authorized for detection and/or diagnosis of SARS-CoV-2 by FDA under an Emergency Use Authorization (EUA). This EUA will remain in effect (meaning this test can be used) for the duration of the COVID-19 declaration under Section 564(b)(1) of the Act, 21 U.S.C. section 360bbb-3(b)(1), unless the authorization is terminated or revoked.  Performed at [facility], [address] [place], [place] [address]     Radiology Studies: No results found.  Scheduled Meds:  arformoterol  15 mcg Nebulization BID   budesonide  0.5 mg Nebulization BID   buPROPion  300 mg Oral q AM   DULoxetine  60 mg Oral QHS   enoxaparin (LOVENOX) injection  60 mg Subcutaneous Daily   [START ON 05/31/2023] ferrous sulfate  325 mg Oral Once per day on Monday Thursday   furosemide  60 mg Oral BID   gabapentin  300 mg Oral BID   insulin aspart  0-20 Units Subcutaneous TID WC   insulin glargine  65 Units Subcutaneous QHS   predniSONE  40 mg Oral Q breakfast   rOPINIRole  4 mg Oral QHS    Continuous Infusions:  albuterol Stopped (05/28/23 0400)     LOS: 1 day   35 minutes with more than 50% spent in reviewing records, counseling patient/family and coordinating care.  Tobey Grim, MD [facility] [email or web address] 05/30/2023, 4:17 PM

## 2023-05-31 ENCOUNTER — Other Ambulatory Visit (HOSPITAL_COMMUNITY): Payer: Self-pay

## 2023-05-31 ENCOUNTER — Other Ambulatory Visit: Payer: Self-pay

## 2023-05-31 DIAGNOSIS — J441 Chronic obstructive pulmonary disease with (acute) exacerbation: Secondary | ICD-10-CM | POA: Diagnosis not present

## 2023-05-31 LAB — GLUCOSE, CAPILLARY
Glucose-Capillary: 165 mg/dL — ABNORMAL HIGH (ref 70–99)
Glucose-Capillary: 285 mg/dL — ABNORMAL HIGH (ref 70–99)

## 2023-05-31 MED ORDER — BENZONATATE 200 MG PO CAPS
200.0000 mg | ORAL_CAPSULE | Freq: Three times a day (TID) | ORAL | 0 refills | Status: DC | PRN
  Filled 2023-05-31 – 2023-06-02 (×2): qty 20, 7d supply, fill #0

## 2023-05-31 MED ORDER — INSULIN GLARGINE-YFGN 100 UNIT/ML ~~LOC~~ SOLN
65.0000 [IU] | Freq: Every day | SUBCUTANEOUS | Status: DC
  Filled 2023-05-31: qty 0.65

## 2023-05-31 MED ORDER — PREDNISONE 20 MG PO TABS
40.0000 mg | ORAL_TABLET | Freq: Every day | ORAL | 0 refills | Status: AC
  Filled 2023-05-31 – 2023-06-02 (×2): qty 6, 3d supply, fill #0

## 2023-05-31 NOTE — Inpatient Diabetes Management (Signed)
 Inpatient Diabetes Program Recommendations  AACE/ADA: New Consensus Statement on Inpatient Glycemic Control (2015)  Target Ranges:  Prepandial:   less than 140 mg/dL      Peak postprandial:   less than 180 mg/dL (1-2 hours)      Critically ill patients:  140 - 180 mg/dL   Lab Results  Component Value Date   GLUCAP 285 (H) 05/31/2023   HGBA1C 10.3 (H) 03/18/2023    Spoke with pt at bedside regarding A1c 10.3% on 1/9 and her following up with her Endocrinologist. Pt reports better glucose trends at home since starting [facility] and doing Dr. Harvel Ricks suggestions of setting out her insulin to remind herself to take her insulin. Pt reports she has a follow up scheduled very soon since her last appointment in January. She has not had side effects of mounjaro since starting it. Pt does not have needs at this time.  Thanks,  Christena Deem RN, MSN, BC-ADM Inpatient Diabetes Coordinator Team Pager [phone] (8a-5p)

## 2023-05-31 NOTE — Inpatient Diabetes Management (Addendum)
 Inpatient Diabetes Program Recommendations  AACE/ADA: New Consensus Statement on Inpatient Glycemic Control (2015)  Target Ranges:  Prepandial:   less than 140 mg/dL      Peak postprandial:   less than 180 mg/dL (1-2 hours)      Critically ill patients:  140 - 180 mg/dL   Lab Results  Component Value Date   GLUCAP 165 (H) 05/31/2023   HGBA1C 10.3 (H) 03/18/2023    Review of Glycemic Control  Latest Reference Range & Units 05/30/23 08:45 05/30/23 12:46 05/30/23 16:03 05/30/23 21:49 05/31/23 08:00  Glucose-Capillary 70 - 99 mg/dL 161 (H)  Novolog 7 units 278 (H)  Novolog 11 units 396 (H)  Novolog 20 units 388 (H)     Lantus 65 units 165 (H)  Novolog 4 units     PO prednisone 40 mg Daily   Diabetes history: DM 2 Outpatient Diabetes medications: Moujaro 2.5 mg weekly, Tresiba 110 units qhs, Novolog 16 units 4x/day (per Endo 130/20) Current orders for Inpatient glycemic control:  Lantus 65 units qhs Novolog 0-20 units tid  A1c 10.3% on 03/18/2023 at PCP visit, Mounjaro started the week prior (was inconsistent with meds suggestions made byt Endo to keep insulin close to nightstand and on table so pt can take at bedtime and Novolog before meals) Sees Endocrinology Dr. Lonzo Cloud last visit on 04/09/2023 Not candidate for SGLT-2 chronic UTI's  PO prednisone 40 mg Daily  Inpatient Diabetes Program Recommendations:    -   Add Novolog 10 units tid meal coverage if eating >50% of meals  Will see pt to inquire about glucose trends at home and when her follow up appointment is with Dr. Lonzo Cloud, Endocrinologist   Thanks,  Christena Deem RN, MSN, BC-ADM Inpatient Diabetes Coordinator Team Pager [phone] (8a-5p)

## 2023-05-31 NOTE — Discharge Summary (Addendum)
 Physician Discharge Summary   Patient: Kathleen Hatfield MRN: [id number] DOB: [birth year]  Admit date:     05/27/2023  Discharge date: 05/31/23  Discharge Physician: Tobey Grim   PCP: Joaquim Nam, MD   Recommendations at discharge:    Patient discharged home with 3 further days of prednisone.  To complete course. Patient had lots of questions about recommendations.  She has HFpEF rather than reduced ejection heart failure and is on diuretics.  Recommended to discuss further with cardiology about fluid restrictions.  She does report feeling better if she drinks more water on a daily basis. Follow-up with pulmonologist in the next 2 weeks or so for hospital follow-up.  Discharge Diagnoses: Principal Problem:   COPD with acute exacerbation (HCC) Active Problems:   Left arm pain   Acute renal failure superimposed on stage 3a chronic kidney disease (HCC)   Chest pain   Hypokalemia   (HFpEF) heart failure with preserved ejection fraction (HCC)   Diabetes mellitus (HCC)   Essential hypertension   Recurrent UTI   RLS (restless legs syndrome)   Ulcerative colitis (HCC)   OSA on CPAP   Hyperlipidemia   AKI (acute kidney injury) (HCC)  Resolved Problems:   GERD without esophagitis   Hypersomnia with sleep apnea   Leukocytosis  Hospital Course: 68 y.o. female with medical history significant of T2DM, COPD, diastolic CHF, CKD, HLD and statin intolerant, HTN, RLS, OSA, neuropathy, UC, chronic respiratory failure on 2L oxygen, who presented to ED with worsening dyspnea on exertion/shortness of breath. She states she came to the hospital because her left arm was hurting and it radiated to her shoulder. She felt like she had a brillo pad in her chest.  Manage due to persistent dyspnea on exertion.  Also reports "shakiness" which has been present for about 3 years for which "no one has an answer."  Admitted for COPD exacerbation.  Started on bronchodilators plus steroids.  Continued with home  long-acting controlled inhalers.  Patient experienced continued improvement throughout hospitalization.  On day of discharge she was back to her home oxygen levels.  Able to walk around the room without any further dyspnea.  She did receive 24 hours of IV fluids after an AKI secondary to dehydration.  Restarted on home diuretics the day before discharge.  Has good urinary output.  No signs of fluid overload in the presence oxygen and was back to baseline she was discharged home with close follow-up with pulmonology.  Assessment and Plan: * COPD with acute exacerbation (HCC) 68 year old presenting to ED with 3 week history of worsening shortness of breath, chest tightness and dyspnea on exertion found to be wheezing and tight on exam consistent with COPD exacerbation  Recovered well from COPD exacerbation standpoint.  There was deafly some thought that debility also played into her ongoing dyspnea and recommended to increase activity.  Left arm pain 4 day history of non traumatic arm pain.  Resolved on exam prior to discharge.  Acute renal failure superimposed on stage 3a chronic kidney disease (HCC) Baseline creatinine 1.1-1.2, now up to 1.7 She took metolazone and has not eaten or drunk for the past day since being in Ed.  Improved with 24 hours fluid per IV.  Restarted on home diuretics monitor creatinine and returned to baseline levels.  Chest pain Resolved and reproducible on exam  EKG wnl no acute findings, troponin negative x 2 Atypical. Check inflammatory markers Suspect more MSK/costochondritis   Hypokalemia Replaced while in  house.  (HFpEF) heart failure with preserved ejection fraction (HCC) Daily weights Bnp wnl/CXR with no acute findings  Echo 10/12024: normal EF, grade 1 DD Daily weights  Restarted on home medications at discharge.  Diabetes mellitus (HCC) Restart home medications at discharge  Essential hypertension Hold aldactone 12.5mg  daily, lasix 60mg  BID due to  AKI on CKD  PRN hydralazine   Recurrent UTI This was restarted at discharge.  However recommend switching to medication not on beers list based on her age.  RLS (restless legs syndrome) Continue requip  Ulcerative colitis (HCC) Followed by Dr. Sol Passer on hold per patient  No diarrhea or flair   OSA on CPAP Continue cpap at night   Hyperlipidemia Statin intolerant. Praluent q 14 days    CDI Query addition: - Class III obesity noted as per CDI documentation - Chronic hypoxic respiratory failure      Disposition: Home Diet recommendation:  Discharge Diet Orders (From admission, onward)     Start     Ordered   05/31/23 0000  Diet - low sodium heart healthy        05/31/23 1441           Regular diet DISCHARGE MEDICATION: Allergies as of 05/31/2023       Reactions   Almond Oil Anaphylaxis, Shortness Of Breath, Swelling   Morphine And Codeine Shortness Of Breath, Other (See Comments)   Pt. States while in the hospital it affected her breathing, O2 dropped to the 70's   Primidone Shortness Of Breath       Atorvastatin Other (See Comments)   Leg weakness   Ceclor [cefaclor] Nausea And Vomiting   Latex Rash   Material used for CPAP strap caused severe skin irritation.  Not sure if she is allergic to all latex   Sulfa Antibiotics Nausea And Vomiting   Ciprofloxacin Other (See Comments)   Makes joints and muscles ache   Levaquin [levofloxacin] Other (See Comments)   Body aches   Losartan Other (See Comments)   Weakness   Peanut-containing Drug Products    Pt states she thinks it is causing gout flare-ups   Repatha [evolocumab]    Aches   Statins Other (See Comments)   Leg and body weakness        Medication List     TAKE these medications    acetaminophen 500 MG tablet Commonly known as: TYLENOL Take 1,000 mg by mouth 2 (two) times daily.   albuterol 108 (90 Base) MCG/ACT inhaler Commonly known as: VENTOLIN HFA Inhale 2 puffs into the lungs  2 (two) to 3 (three) times daily as needed for shortness of breath/wheeze.   arformoterol 15 MCG/2ML Nebu Commonly known as: BROVANA Take 2 mLs (15 mcg total) by nebulization 2 (two) times daily.   BD Pen Needle Nano 2nd Gen 32G X 4 MM Misc Generic drug: Insulin Pen Needle Use for insulin in the morning, at noon, in the evening, and at bedtime.   benzonatate 200 MG capsule Commonly known as: TESSALON Take 1 capsule (200 mg total) by mouth 3 (three) times daily as needed for cough.   budesonide 0.5 MG/2ML nebulizer solution Commonly known as: Pulmicort Take 2 mLs (0.5 mg total) by nebulization 2 (two) times daily.   buPROPion 300 MG 24 hr tablet Commonly known as: WELLBUTRIN XL Take 1 tablet (300 mg total) by mouth in the morning.   colchicine 0.6 MG tablet Take 1 tablet (0.6 mg total) by mouth daily as needed for  gout. What changed: when to take this   dicyclomine 20 MG tablet Commonly known as: BENTYL Take 1 tablet (20 mg total) by mouth 4 (four) times daily as needed for GI cramping, pain, nausea/vomiting.   DULoxetine 60 MG capsule Commonly known as: CYMBALTA Take 1 capsule (60 mg total) by mouth at bedtime.   FeroSul 325 (65 Fe) MG tablet Generic drug: ferrous sulfate Take 1 tablet (325 mg total) by mouth every other day. What changed:  when to take this additional instructions   furosemide 20 MG tablet Commonly known as: LASIX Take 3 tablets (60 mg total) by mouth 2 (two) times daily.   gabapentin 300 MG capsule Commonly known as: NEURONTIN Take 1 capsule (300 mg total) by mouth 2 (two) times daily.   ipratropium-albuterol 0.5-2.5 (3) MG/3ML Soln Commonly known as: DUONEB Take 3 mLs by nebulization every 6 (six) hours as needed (wheezing).   ketoconazole 2 % cream Commonly known as: NIZORAL Apply 1 Application topically 2 (two) times daily.   mesalamine 1.2 g EC tablet Commonly known as: LIALDA Take 2 tablets (2.4 g total) by mouth 2 (two) times  daily.   metolazone 5 MG tablet Commonly known as: ZAROXOLYN Take 1 tablet (5 mg total) by mouth daily as needed.   Mounjaro 2.5 MG/0.5ML Pen Generic drug: tirzepatide Inject 2.5 mg into the skin once a week.   nitrofurantoin 100 MG capsule Commonly known as: MACRODANTIN Take 1 capsule (100 mg total) by mouth daily. What changed: when to take this   NovoLOG FlexPen 100 UNIT/ML FlexPen Generic drug: insulin aspart Inject 16 Units into the skin 3 (three) times daily with meals. Per sliding scale.  Max daily 45 units What changed:  when to take this additional instructions   ondansetron 4 MG tablet Commonly known as: ZOFRAN Take 1 tablet (4 mg total) by mouth every 8 (eight) hours as needed for nausea and vomiting   optichamber diamond Misc Use as dirtected with inhaler   OXYGEN Inhale 2 L into the lungs continuous.   potassium chloride 10 MEQ CR capsule Commonly known as: MICRO-K Take 2 capsules (20 mEq total) by mouth in the morning AND 1 capsule (10 mEq total) daily at 12 noon AND 2 capsules (20 mEq total) every evening.   Praluent 150 MG/ML Soaj Generic drug: Alirocumab Inject 1 mL (150 mg total) into the skin every 14 (fourteen) days.   predniSONE 20 MG tablet Commonly known as: DELTASONE Take 2 tablets (40 mg total) by mouth daily with breakfast for 3 days. Start taking on: June 01, 2023   rOPINIRole 4 MG tablet Commonly known as: REQUIP Take 1 tablet (4 mg total) by mouth at bedtime.   spironolactone 25 MG tablet Commonly known as: ALDACTONE Take 0.5 tablets (12.5 mg total) by mouth daily.   traMADol 50 MG tablet Commonly known as: ULTRAM Take 1 tablet (50 mg total) by mouth 3 (three) times daily as needed. What changed:  when to take this reasons to take this   Tresiba FlexTouch 200 UNIT/ML FlexTouch Pen Generic drug: insulin degludec Inject 110 Units into the skin daily. What changed: when to take this   triamcinolone cream 0.1 % Commonly known  as: KENALOG Apply 1 Application topically 2 (two) times daily as needed. What changed: when to take this   Vitamin D-3 125 MCG (5000 UT) Tabs Take 5,000 Units by mouth every other day.        Discharge Exam: Filed Weights   05/27/23 1452  05/29/23 0500 05/31/23 0500  Weight: 117 kg 120.6 kg 119.1 kg   Gen: 68 y.o. female in no apparent distress.  Nontoxic Pulm: Not able to move air fairly well but more secondary to deconditioning rather than intrinsic lung issue.  No wheezing or adventitious breath sounds CV: Regular rate and rhythm. No murmur, rub, or gallop. No JVD GI: Abdomen soft, non-tender, non-distended, Ext: Warm, no deformities, no pedal edema Skin: No rashes, lesions  Neuro: Alert and oriented. No focal neurological deficits. Psych: Calm  Judgement and insight appear normal. Mood & affect appropriate.  Condition at discharge: good  The results of significant diagnostics from this hospitalization (including imaging, microbiology, ancillary and laboratory) are listed below for reference.   Imaging Studies: DG Forearm Left Result Date: 05/28/2023 CLINICAL DATA:  Left shoulder pain radiating to arm. EXAM: LEFT FOREARM - 2 VIEW; LEFT SHOULDER - 2+ VIEW COMPARISON:  None Available. FINDINGS: No acute fracture or dislocation. No aggressive osseous lesion. Glenohumeral and acromioclavicular joints are normal in alignment. There are mild diffuse degenerative changes of the shoulder, elbow and wrist joints. No soft tissue swelling. No radiopaque foreign bodies. IMPRESSION: No acute osseous abnormality of the left shoulder and forearm. Electronically Signed   By: Jules Schick M.D.   On: 05/28/2023 14:07   DG Shoulder Left Result Date: 05/28/2023 CLINICAL DATA:  Left shoulder pain radiating to arm. EXAM: LEFT FOREARM - 2 VIEW; LEFT SHOULDER - 2+ VIEW COMPARISON:  None Available. FINDINGS: No acute fracture or dislocation. No aggressive osseous lesion. Glenohumeral and  acromioclavicular joints are normal in alignment. There are mild diffuse degenerative changes of the shoulder, elbow and wrist joints. No soft tissue swelling. No radiopaque foreign bodies. IMPRESSION: No acute osseous abnormality of the left shoulder and forearm. Electronically Signed   By: Jules Schick M.D.   On: 05/28/2023 14:07   DG Chest 2 View Result Date: 05/27/2023 CLINICAL DATA:  Chest pain, left arm pain, short of breath EXAM: CHEST - 2 VIEW COMPARISON:  12/11/2022 FINDINGS: Frontal and lateral views of the chest demonstrate an unremarkable cardiac silhouette. No acute airspace disease, effusion, or pneumothorax. No acute bony abnormalities. IMPRESSION: 1. No acute intrathoracic process. Electronically Signed   By: Sharlet Salina M.D.   On: 05/27/2023 16:20    Microbiology: Results for orders placed or performed during the hospital encounter of 05/27/23  Resp panel by RT-PCR (RSV, Flu A&B, Covid) Anterior Nasal Swab     Status: None   Collection Time: 05/28/23  5:38 AM   Specimen: Anterior Nasal Swab  Result Value Ref Range Status   SARS Coronavirus 2 by RT PCR NEGATIVE NEGATIVE Final   Influenza A by PCR NEGATIVE NEGATIVE Final   Influenza B by PCR NEGATIVE NEGATIVE Final    Comment: (NOTE) The Xpert Xpress SARS-CoV-2/FLU/RSV plus assay is intended as an aid in the diagnosis of influenza from Nasopharyngeal swab specimens and should not be used as a sole basis for treatment. Nasal washings and aspirates are unacceptable for Xpert Xpress SARS-CoV-2/FLU/RSV testing.  Fact Sheet for Patients: BloggerCourse.com  Fact Sheet for Healthcare Providers: SeriousBroker.it  This test is not yet approved or cleared by the [place] FDA and has been authorized for detection and/or diagnosis of SARS-CoV-2 by FDA under an Emergency Use Authorization (EUA). This EUA will remain in effect (meaning this test can be used) for the duration  of the COVID-19 declaration under Section 564(b)(1) of the Act, 21 U.S.C. section 360bbb-3(b)(1), unless the authorization is terminated  or revoked.     Resp Syncytial Virus by PCR NEGATIVE NEGATIVE Final    Comment: (NOTE) Fact Sheet for Patients: BloggerCourse.com  Fact Sheet for Healthcare Providers: SeriousBroker.it  This test is not yet approved or cleared by the [place] FDA and has been authorized for detection and/or diagnosis of SARS-CoV-2 by FDA under an Emergency Use Authorization (EUA). This EUA will remain in effect (meaning this test can be used) for the duration of the COVID-19 declaration under Section 564(b)(1) of the Act, 21 U.S.C. section 360bbb-3(b)(1), unless the authorization is terminated or revoked.  Performed at [facility], [address] [place], [place] [address]     Labs: CBC: Recent Labs  Lab 05/27/23 1527 05/28/23 1034 05/29/23 0647 05/30/23 0837  WBC 13.1* 10.1 13.7* 13.9*  NEUTROABS 6.9  --   --   --   HGB 15.3* 14.4 13.4 13.5  HCT 47.0* 44.2 39.8 40.0  MCV 93.3 95.1 91.9 92.2  PLT 271 230 241 219   Basic Metabolic Panel: Recent Labs  Lab 05/27/23 1527 05/28/23 1034 05/28/23 1536 05/29/23 0647 05/29/23 1704 05/30/23 0837  NA 137 141  --  136  --  137  K 3.3* 3.1*  --  4.0  --  3.5  CL 88* 90*  --  90*  --  95*  CO2 35* 35*  --  32  --  32  GLUCOSE 197* 193*  --  321* 398* 185*  BUN 26* 34*  --  47*  --  35*  CREATININE 1.36* 1.72*  --  1.80*  --  1.11*  CALCIUM 10.6* 10.0  --  9.6  --  9.5  MG  --   --  1.9  --   --   --    Liver Function Tests: No results for input(s): "AST", "ALT", "ALKPHOS", "BILITOT", "PROT", "ALBUMIN" in the last 168 hours. CBG: Recent Labs  Lab 05/30/23 1246 05/30/23 1603 05/30/23 2149 05/31/23 0800 05/31/23 1214  GLUCAP 278* 396* 388* 165* 285*    Discharge time spent: less than 30 minutes.  Signed: Tobey Grim,  MD [facility] 05/31/2023

## 2023-05-31 NOTE — Plan of Care (Signed)
 Patient AAOx4. NC2L in place. PRN Bentyl given with + effect. Safety precautions maintained.  Discharge instructions discussed with patient, verbalized understanding. RPIV removed. Patient left with all belongings in no acute or respiratory distress. Escorted to discharge lounge.    Problem: Coping: Goal: Ability to adjust to condition or change in health will improve Outcome: Progressing   Problem: Health Behavior/Discharge Planning: Goal: Ability to manage health-related needs will improve Outcome: Progressing   Problem: Metabolic: Goal: Ability to maintain appropriate glucose levels will improve Outcome: Progressing

## 2023-06-01 ENCOUNTER — Other Ambulatory Visit: Payer: Self-pay

## 2023-06-01 ENCOUNTER — Encounter: Payer: Self-pay | Admitting: Pharmacist

## 2023-06-01 ENCOUNTER — Telehealth: Payer: Self-pay

## 2023-06-01 NOTE — Transitions of Care (Post Inpatient/ED Visit) (Signed)
 06/01/2023  Name: Kathleen Hatfield MRN: [id number] DOB: [birth year]  Today's TOC FU Call Status: Today's TOC FU Call Status:: Successful TOC FU Call Completed TOC FU Call Complete Date: 06/01/23 Patient's Name and Date of Birth confirmed.  Transition Care Management Follow-up Telephone Call Date of Discharge: 05/31/23 Discharge Facility: [facility] [place]) Type of Discharge: Inpatient Admission Primary Inpatient Discharge Diagnosis:: COPD, CHF How have you been since you were released from the hospital?: Better Any questions or concerns?: No (Reviewed comments from the hosptial discharge that more education is needed for fluids)  Items Reviewed: Did you receive and understand the discharge instructions provided?: Yes (Reviewed Hatfield weights, fluid modifications, medications) Medications obtained,verified, and reconciled?: Yes (Medications Reviewed) Any new allergies since your discharge?: No Dietary orders reviewed?: Yes Type of Diet Ordered:: Low sodium Heart Healthy Do you have support at home?: Yes People in Home: significant other Name of Support/Comfort Primary Source: Kathleen Hatfield, Significant other  Medications Reviewed Today: Medications Reviewed Today     Reviewed by Redge Gainer, RN (Case Manager) on 06/01/23 at 1044  Med List Status: <None>   Medication Order Taking? Sig Documenting Provider Last Dose Status Informant  acetaminophen (TYLENOL) 500 MG tablet [id number] No Take 1,000 mg by mouth 2 (two) times Hatfield. [provider] Past Week Active Self, Pharmacy Records  albuterol (VENTOLIN HFA) 108 (90 Base) MCG/ACT inhaler [id number] No Inhale 2 puffs into the lungs 2 (two) to 3 (three) times Hatfield as needed for shortness of breath/wheeze. Kathleen Nam, MD Past Month Active Self, Pharmacy Records  Alirocumab (PRALUENT) 150 MG/ML Ivory Broad [id number] No Inject 1 mL (150 mg total) into the skin every 14 (fourteen) days. Kathleen Nam, MD Past Month Active Self,  Pharmacy Records  arformoterol [facility]) 15 MCG/2ML NEBU [id number] No Take 2 mLs (15 mcg total) by nebulization 2 (two) times Hatfield. Kathleen Chapel, NP Past Week Active Self, Pharmacy Records  benzonatate (TESSALON) 200 MG capsule [id number]  Take 1 capsule (200 mg total) by mouth 3 (three) times Hatfield as needed for cough. Kathleen Grim, MD  Active   budesonide (PULMICORT) 0.5 MG/2ML nebulizer solution [id number] No Take 2 mLs (0.5 mg total) by nebulization 2 (two) times Hatfield. Kathleen Chapel, NP Past Week Active Self, Pharmacy Records  buPROPion (WELLBUTRIN XL) 300 MG 24 hr tablet [id number] No Take 1 tablet (300 mg total) by mouth in the morning. Kathleen Nam, MD Past Week Active Self, Pharmacy Records  Cholecalciferol (VITAMIN D-3) 5000 UNITS TABS [id number] No Take 5,000 Units by mouth every other day.  [provider] Past Week Active Self, Pharmacy Records  colchicine 0.6 MG tablet [id number] No Take 1 tablet (0.6 mg total) by mouth Hatfield as needed for gout.  Patient taking differently: Take 0.6 mg by mouth at bedtime.   Kathleen Nam, MD Past Week Active Self, Pharmacy Records  dicyclomine (BENTYL) 20 MG tablet [id number] No Take 1 tablet (20 mg total) by mouth 4 (four) times Hatfield as needed for GI cramping, pain, nausea/vomiting. Kathleen Nam, MD Past Week Active Self, Pharmacy Records  DULoxetine (CYMBALTA) 60 MG capsule [id number] No Take 1 capsule (60 mg total) by mouth at bedtime. Kathleen Nam, MD Past Week Active Self, Pharmacy Records  ferrous sulfate 325 (65 FE) MG tablet [id number] No Take 1 tablet (325 mg total) by mouth every other day.  Patient taking differently: Take 325 mg by mouth 2 (two) times a week. Take on Sat and Sund  Kathleen Nam, MD Past Week Active Self, Pharmacy Records  furosemide (LASIX) 20 MG tablet [id number] No Take 3 tablets (60 mg total) by mouth 2 (two) times Hatfield. Kathleen Bollman, MD 05/27/2023 Morning Active Self, Pharmacy Records   gabapentin (NEURONTIN) 300 MG capsule [id number] No Take 1 capsule (300 mg total) by mouth 2 (two) times Hatfield. Kathleen Nam, MD Past Week Active Self, Pharmacy Records  insulin aspart (NOVOLOG FLEXPEN) 100 UNIT/ML FlexPen [id number] No Inject 16 Units into the skin 3 (three) times Hatfield with meals. Per sliding scale.  Kathleen Hatfield 45 units  Patient taking differently: Inject 16 Units into the skin 4 (four) times Hatfield -  before meals and at bedtime.   Shamleffer, Kathleen Dolores, MD Past Week Active Self, Pharmacy Records  insulin degludec (TRESIBA FLEXTOUCH) 200 UNIT/ML FlexTouch Pen [id number] No Inject 110 Units into the skin Hatfield.  Patient taking differently: Inject 110 Units into the skin at bedtime.   Shamleffer, Kathleen Dolores, MD Past Week Active Self, Pharmacy Records  Insulin Pen Needle (GLOBAL EASE INJECT PEN NEEDLES) 32G X 4 MM MISC [id number] No Use for insulin in the morning, at noon, in the evening, and at bedtime. Shamleffer, Kathleen Dolores, MD Taking Active Self, Pharmacy Records  ipratropium-albuterol (DUONEB) 0.5-2.5 (3) MG/3ML SOLN [id number] No Take 3 mLs by nebulization every 6 (six) hours as needed (wheezing). Kathleen Helling, MD 05/27/2023 Noon Active Self, Pharmacy Records  ketoconazole (NIZORAL) 2 % cream [id number] No Apply 1 Application topically 2 (two) times Hatfield. Kathleen Nam, MD Unknown Active Self, Pharmacy Records  mesalamine (LIALDA) 1.2 g EC tablet [id number] No Take 2 tablets (2.4 g total) by mouth 2 (two) times Hatfield.  Patient not taking: Reported on 05/28/2023    Not Taking Active Self, Pharmacy Records           Med Note (LEE, NICOLE   Fri May 28, 2023  7:30 AM) Pt is holding medication   metolazone (ZAROXOLYN) 5 MG tablet [id number] No Take 1 tablet (5 mg total) by mouth Hatfield as needed. Kathleen Nam, MD Past Week Active Self, Pharmacy Records  nitrofurantoin (MACRODANTIN) 100 MG capsule [id number] No Take 1 capsule (100 mg total) by mouth Hatfield.  Patient  taking differently: Take 100 mg by mouth at bedtime.    Past Week Active Self, Pharmacy Records  ondansetron (ZOFRAN) 4 MG tablet [id number] No Take 1 tablet (4 mg total) by mouth every 8 (eight) hours as needed for nausea and vomiting Kathleen Nam, MD Past Week Active Self, Pharmacy Records  OXYGEN [id number] No Inhale 2 L into the lungs continuous. [provider] 05/27/2023 Morning Active Self, Pharmacy Records  potassium chloride (MICRO-K) 10 MEQ CR capsule [id number] No Take 2 capsules (20 mEq total) by mouth in the morning AND 1 capsule (10 mEq total) Hatfield at 12 noon AND 2 capsules (20 mEq total) every evening. Kathleen Nam, MD Past Week Active Self, Pharmacy Records  predniSONE (DELTASONE) 20 MG tablet [id number]  Take 2 tablets (40 mg total) by mouth Hatfield with breakfast for 3 days. Kathleen Grim, MD  Active   rOPINIRole (REQUIP) 4 MG tablet [id number] No Take 1 tablet (4 mg total) by mouth at bedtime. Kathleen Nam, MD Past Week Active Self, Pharmacy Records  Spacer/Aero-Holding Deretha [facility] DIAMOND) MISC [id number] No Use as dirtected with inhaler  05/27/2023 Morning Active Self, Pharmacy Records  spironolactone (ALDACTONE) 25 MG tablet [id number] No Take 0.5 tablets (12.5 mg total) by mouth Hatfield.  Kathleen Nam, MD 05/27/2023 Morning Active Self, Pharmacy Records  tirzepatide [facility]) 2.5 MG/0.5ML Pen [id number] No Inject 2.5 mg into the skin once a week. Shamleffer, Kathleen Dolores, MD Past Month Active Self, Pharmacy Records           Med Note Nedra Hai, NICOLE   Fri May 28, 2023  7:25 AM) Injects on Tuesday  traMADol (ULTRAM) 50 MG tablet [id number] No Take 1 tablet (50 mg total) by mouth 3 (three) times Hatfield as needed.  Patient taking differently: Take 50 mg by mouth Hatfield as needed for moderate pain (pain score 4-6).   Kathleen Nam, MD Past Week Active Self, Pharmacy Records  triamcinolone cream (KENALOG) 0.1 % [id number] No Apply 1 Application topically 2  (two) times Hatfield as needed.  Patient taking differently: Apply 1 Application topically Hatfield.   Kathleen Nam, MD Past Month Active Self, Pharmacy Records            Home Care and Equipment/Supplies: Were Home Health Services Ordered?: No Any new equipment or medical supplies ordered?: No  Functional Questionnaire: Do you need assistance with bathing/showering or dressing?: No Do you need assistance with meal preparation?: Yes (Gets SOB) Do you need assistance with eating?: No Do you have difficulty maintaining continence: No Do you need assistance with getting out of bed/getting out of a chair/moving?: No Do you have difficulty managing or taking your medications?: No  Follow up appointments reviewed: PCP Follow-up appointment confirmed?: Yes Date of PCP follow-up appointment?: 06/07/23 Follow-up Provider: Crawford Givens Specialist [facility] Follow-up appointment confirmed?: Yes Date of Specialist follow-up appointment?: 06/06/23 Follow-Up Specialty Provider:: Dr. Lonzo Cloud Do you need transportation to your follow-up appointment?: No Do you understand care options if your condition(s) worsen?: Yes-patient verbalized understanding  SDOH Interventions Today    Flowsheet Row Most Recent Value  SDOH Interventions   Food Insecurity Interventions Intervention Not Indicated  Housing Interventions Intervention Not Indicated  Transportation Interventions Intervention Not Indicated  Utilities Interventions Intervention Not Indicated      Interventions Today    Flowsheet Row Most Recent Value  Chronic Disease   Chronic disease during today's visit Chronic Obstructive Pulmonary Disease (COPD), Congestive Heart Failure (CHF)  General Interventions   General Interventions Discussed/Reviewed General Interventions Discussed, General Interventions Reviewed, Doctor Visits  Doctor Visits Discussed/Reviewed Doctor Visits Reviewed  Exercise Interventions   Exercise  Discussed/Reviewed Physical Activity, Exercise Reviewed  Physical Activity Discussed/Reviewed Physical Activity Discussed, Physical Activity Reviewed  Education Interventions   Education Provided Provided Education  Provided Verbal Education On Medication, Exercise, When to see the doctor, Insurance Plans  Pharmacy Interventions   Pharmacy Dicussed/Reviewed Medications and their functions     The patient has been provided with contact information for the care management team and has been advised to call with any health-related questions or concerns. The patient verbalized understanding with current POC. The patient is directed to their insurance card regarding availability of benefits coverage  Deidre Ala, BSN, RN [place]  [facility] - [facility] RN Care Manager [phone]

## 2023-06-02 ENCOUNTER — Encounter: Payer: Self-pay | Admitting: Pharmacist

## 2023-06-02 ENCOUNTER — Other Ambulatory Visit (HOSPITAL_COMMUNITY): Payer: Self-pay

## 2023-06-02 ENCOUNTER — Other Ambulatory Visit: Payer: Self-pay

## 2023-06-02 ENCOUNTER — Encounter (HOSPITAL_COMMUNITY): Payer: Self-pay

## 2023-06-03 ENCOUNTER — Telehealth: Payer: Self-pay

## 2023-06-03 ENCOUNTER — Other Ambulatory Visit (HOSPITAL_COMMUNITY): Payer: Self-pay

## 2023-06-03 NOTE — Telephone Encounter (Signed)
 Pharmacy Patient Advocate Encounter   Received notification from CoverMyMeds that prior authorization for Praluent 150MG /ML auto-injectors is required/requested.   Insurance verification completed.   The patient is insured through [facility] .   Per test claim: PA required; PA submitted to above mentioned insurance via CoverMyMeds Key/confirmation #/EOC [id number] Status is pending

## 2023-06-03 NOTE — Telephone Encounter (Signed)
 Copied from CRM [id number]. Topic: Clinical - Prescription Issue >> Jun 02, 2023  4:27 PM Fredrich Romans wrote: Reason for CRM: Patient called In to ask about the PA for medication Alirocumab (PRALUENT) 150 MG/ML SOAJ.I see that insurance is rejecting it and needs an authorization from provider.Patient would like to know the status.

## 2023-06-04 ENCOUNTER — Other Ambulatory Visit (HOSPITAL_COMMUNITY): Payer: Self-pay

## 2023-06-07 ENCOUNTER — Encounter: Payer: Self-pay | Admitting: Family Medicine

## 2023-06-07 ENCOUNTER — Ambulatory Visit (INDEPENDENT_AMBULATORY_CARE_PROVIDER_SITE_OTHER): Admitting: Family Medicine

## 2023-06-07 ENCOUNTER — Other Ambulatory Visit (HOSPITAL_COMMUNITY): Payer: Self-pay

## 2023-06-07 VITALS — BP 130/70 | HR 90 | Ht 65.0 in

## 2023-06-07 DIAGNOSIS — M109 Gout, unspecified: Secondary | ICD-10-CM | POA: Diagnosis not present

## 2023-06-07 DIAGNOSIS — J441 Chronic obstructive pulmonary disease with (acute) exacerbation: Secondary | ICD-10-CM

## 2023-06-07 DIAGNOSIS — E1169 Type 2 diabetes mellitus with other specified complication: Secondary | ICD-10-CM

## 2023-06-07 DIAGNOSIS — I5032 Chronic diastolic (congestive) heart failure: Secondary | ICD-10-CM

## 2023-06-07 LAB — CBC WITH DIFFERENTIAL/PLATELET
Basophils Absolute: 0.1 10*3/uL (ref 0.0–0.1)
Basophils Relative: 0.6 % (ref 0.0–3.0)
Eosinophils Absolute: 0.1 10*3/uL (ref 0.0–0.7)
Eosinophils Relative: 0.5 % (ref 0.0–5.0)
HCT: 39.7 % (ref 36.0–46.0)
Hemoglobin: 12.9 g/dL (ref 12.0–15.0)
Lymphocytes Relative: 16.5 % (ref 12.0–46.0)
Lymphs Abs: 2.2 10*3/uL (ref 0.7–4.0)
MCHC: 32.4 g/dL (ref 30.0–36.0)
MCV: 95.2 fl (ref 78.0–100.0)
Monocytes Absolute: 1.3 10*3/uL — ABNORMAL HIGH (ref 0.1–1.0)
Monocytes Relative: 9.9 % (ref 3.0–12.0)
Neutro Abs: 9.5 10*3/uL — ABNORMAL HIGH (ref 1.4–7.7)
Neutrophils Relative %: 72.5 % (ref 43.0–77.0)
Platelets: 278 10*3/uL (ref 150.0–400.0)
RBC: 4.17 Mil/uL (ref 3.87–5.11)
RDW: 14.9 % (ref 11.5–15.5)
WBC: 13.1 10*3/uL — ABNORMAL HIGH (ref 4.0–10.5)

## 2023-06-07 LAB — BASIC METABOLIC PANEL WITH GFR
BUN: 23 mg/dL (ref 6–23)
CO2: 34 meq/L — ABNORMAL HIGH (ref 19–32)
Calcium: 9.2 mg/dL (ref 8.4–10.5)
Chloride: 99 meq/L (ref 96–112)
Creatinine, Ser: 1.18 mg/dL (ref 0.40–1.20)
GFR: 47.5 mL/min — ABNORMAL LOW (ref >60.00)
Glucose, Bld: 156 mg/dL — ABNORMAL HIGH (ref 70–99)
Potassium: 3.7 meq/L (ref 3.5–5.1)
Sodium: 142 meq/L (ref 135–145)

## 2023-06-07 MED ORDER — FERROUS SULFATE 325 (65 FE) MG PO TABS: 325.0000 mg | ORAL_TABLET | ORAL | Status: DC

## 2023-06-07 MED ORDER — COLCHICINE 0.6 MG PO TABS: 0.6000 mg | ORAL_TABLET | Freq: Every day | ORAL | Status: DC | PRN

## 2023-06-07 MED ORDER — NOVOLOG FLEXPEN 100 UNIT/ML ~~LOC~~ SOPN: 16.0000 [IU] | PEN_INJECTOR | Freq: Three times a day (TID) | SUBCUTANEOUS | Status: DC

## 2023-06-07 NOTE — Progress Notes (Unsigned)
 Recommendations at discharge:     Patient discharged home with 3 further days of prednisone.  To complete course. Patient had lots of questions about recommendations.  She has HFpEF rather than reduced ejection heart failure and is on diuretics.  Recommended to discuss further with cardiology about fluid restrictions.  She does report feeling better if she drinks more water on a daily basis. Follow-up with pulmonologist in the next 2 weeks or so for hospital follow-up.   Discharge Diagnoses: Principal Problem:   COPD with acute exacerbation (HCC) Active Problems:   Left arm pain   Acute renal failure superimposed on stage 3a chronic kidney disease (HCC)   Chest pain   Hypokalemia   (HFpEF) heart failure with preserved ejection fraction (HCC)   Diabetes mellitus (HCC)   Essential hypertension   Recurrent UTI   RLS (restless legs syndrome)   Ulcerative colitis (HCC)   OSA on CPAP   Hyperlipidemia   AKI (acute kidney injury) (HCC)   Resolved Problems:   GERD without esophagitis   Hypersomnia with sleep apnea   Leukocytosis   Hospital Course: 68 y.o. female with medical history significant of T2DM, COPD, diastolic CHF, CKD, HLD and statin intolerant, HTN, RLS, OSA, neuropathy, UC, chronic respiratory failure on 2L oxygen, who presented to ED with worsening dyspnea on exertion/shortness of breath. She states she came to the hospital because her left arm was hurting and it radiated to her shoulder. She felt like she had a brillo pad in her chest.  Manage due to persistent dyspnea on exertion.  Also reports "shakiness" which has been present for about 3 years for which "no one has an answer."  Admitted for COPD exacerbation.  Started on bronchodilators plus steroids.  Continued with home long-acting controlled inhalers.  Patient experienced continued improvement throughout hospitalization.  On day of discharge she was back to her home oxygen levels.  Able to walk around the room without any  further dyspnea.  She did receive 24 hours of IV fluids after an AKI secondary to dehydration.  Restarted on home diuretics the day before discharge.  Has good urinary output.  No signs of fluid overload in the presence oxygen and was back to baseline she was discharged home with close follow-up with pulmonology.   Assessment and Plan: * COPD with acute exacerbation (HCC) 68 year old presenting to ED with 3 week history of worsening shortness of breath, chest tightness and dyspnea on exertion found to be wheezing and tight on exam consistent with COPD exacerbation  Recovered well from COPD exacerbation standpoint.  There was deafly some thought that debility also played into her ongoing dyspnea and recommended to increase activity.   Left arm pain 4 day history of non traumatic arm pain.  Resolved on exam prior to discharge.   Acute renal failure superimposed on stage 3a chronic kidney disease (HCC) Baseline creatinine 1.1-1.2, now up to 1.7 She took metolazone and has not eaten or drunk for the past day since being in Ed.  Improved with 24 hours fluid per IV.  Restarted on home diuretics monitor creatinine and returned to baseline levels.   Chest pain Resolved and reproducible on exam  EKG wnl no acute findings, troponin negative x 2 Atypical. Check inflammatory markers suspect more MSK/costochondritis    Hypokalemia Replaced while in house.   (HFpEF) heart failure with preserved ejection fraction (HCC) Daily weights Bnp wnl/CXR with no acute findings  Echo 10/12024: normal EF, grade 1 DD Daily weights  Restarted  on home medications at discharge.   Diabetes mellitus (HCC) Restart home medications at discharge   Essential hypertension Hold aldactone 12.5mg  daily, lasix 60mg  BID due to AKI on CKD  PRN hydralazine    Recurrent UTI This was restarted at discharge.  However recommend switching to medication not on beers list based on her age.   RLS (restless legs  syndrome) Continue requip   Ulcerative colitis (HCC) Followed by Dr. Sol Passer on hold per patient  No diarrhea or flair    OSA on CPAP Continue cpap at night    Hyperlipidemia Statin intolerant. Praluent q 14 days  ==============================  Lower sugar this AM at OV, up to 84 after a snack.  Then 122 on recheck at OV.   She hasn't had a gout flare recently with taking colchicine at night.  D/w pt about slow taper.   Done with prednisone.  No BLE edema.  No metolazone since discharge.   Breathing is better compared to inpatient status.  Still taking 60mg  lasix BID at baseline.      See if pulmonary appointment can get moved sooner.    Ctab No BLE edema.

## 2023-06-07 NOTE — Telephone Encounter (Signed)
 Pharmacy Patient Advocate Encounter  Received notification from [facility] that Prior Authorization for Praluent 150MG /ML auto-injectors  has been APPROVED from 06/04/23 to 03/08/24. Unable to obtain price due to refill too soon rejection, last fill date 06/04/23 next available fill date4/18/25   PA #/Case ID/Reference #:  [id number]

## 2023-06-07 NOTE — Patient Instructions (Addendum)
 Try taking colchicine every other day and see if you can tolerate that.   Don't change your meds yet.   I'll check with pulmonary in the meantime.   Take care.  Glad to see you.

## 2023-06-09 ENCOUNTER — Encounter: Payer: Self-pay | Admitting: Family Medicine

## 2023-06-09 NOTE — Assessment & Plan Note (Signed)
 She is going to try taking colchicine every other day and see if she can gradually wean down.

## 2023-06-09 NOTE — Assessment & Plan Note (Signed)
 Lungs are clear and she is not edematous.  She has not had to use metolazone since discharge.  Continue spironolactone and furosemide as is.  She is taking furosemide 60 mg twice a day.  See notes on labs.

## 2023-06-09 NOTE — Assessment & Plan Note (Signed)
 Hypoglycemia cautions discussed with patient.  I expect her sugar control/variation to improve off prednisone.  Would continue NovoLog and Evaristo Bury as is for now and she can update me if she continues to have hypoglycemia.  She has endocrinology follow-up pending.

## 2023-06-09 NOTE — Assessment & Plan Note (Addendum)
 Currently improved.  Done with prednisone.  She had hypoglycemia at the visit today.  I expect she had more significant sugar variation with recent prednisone use.  I am going to see if her pulmonary appointment can get moved sooner.  Would continue albuterol as needed.  Continue Brovana and budesonide.

## 2023-06-14 DIAGNOSIS — G4733 Obstructive sleep apnea (adult) (pediatric): Secondary | ICD-10-CM | POA: Diagnosis not present

## 2023-06-15 ENCOUNTER — Other Ambulatory Visit: Payer: Self-pay

## 2023-06-15 ENCOUNTER — Other Ambulatory Visit (HOSPITAL_COMMUNITY): Payer: Self-pay

## 2023-06-20 DIAGNOSIS — J449 Chronic obstructive pulmonary disease, unspecified: Secondary | ICD-10-CM | POA: Diagnosis not present

## 2023-06-20 DIAGNOSIS — I10 Essential (primary) hypertension: Secondary | ICD-10-CM | POA: Diagnosis not present

## 2023-06-20 DIAGNOSIS — G4733 Obstructive sleep apnea (adult) (pediatric): Secondary | ICD-10-CM | POA: Diagnosis not present

## 2023-06-22 ENCOUNTER — Telehealth: Payer: Self-pay | Admitting: Pulmonary Disease

## 2023-06-22 NOTE — Telephone Encounter (Signed)
 Kindly have patient follow-up sooner than later for a recent COPD exacerbation hospitalization  Kindly call to offer appointment with myself or APP

## 2023-06-23 NOTE — Telephone Encounter (Signed)
 Dr. Deanna Expose- No appts w/anyone (NP or Dr. ) until late May. You have appt for a FU with her 5/7. Do we need to double book this PT? Thanks.

## 2023-06-24 ENCOUNTER — Other Ambulatory Visit (HOSPITAL_COMMUNITY): Payer: Self-pay

## 2023-06-27 ENCOUNTER — Other Ambulatory Visit: Payer: Self-pay | Admitting: Family Medicine

## 2023-06-28 ENCOUNTER — Other Ambulatory Visit: Payer: Self-pay

## 2023-06-29 NOTE — Telephone Encounter (Signed)
 Done NFN

## 2023-07-07 ENCOUNTER — Ambulatory Visit: Payer: Medicare HMO | Admitting: Internal Medicine

## 2023-07-07 ENCOUNTER — Encounter: Payer: Self-pay | Admitting: Internal Medicine

## 2023-07-07 VITALS — BP 120/80 | HR 96 | Ht 65.0 in | Wt 253.0 lb

## 2023-07-07 DIAGNOSIS — E1142 Type 2 diabetes mellitus with diabetic polyneuropathy: Secondary | ICD-10-CM

## 2023-07-07 DIAGNOSIS — E1122 Type 2 diabetes mellitus with diabetic chronic kidney disease: Secondary | ICD-10-CM | POA: Diagnosis not present

## 2023-07-07 DIAGNOSIS — N1831 Chronic kidney disease, stage 3a: Secondary | ICD-10-CM

## 2023-07-07 DIAGNOSIS — E1159 Type 2 diabetes mellitus with other circulatory complications: Secondary | ICD-10-CM | POA: Diagnosis not present

## 2023-07-07 DIAGNOSIS — Z794 Long term (current) use of insulin: Secondary | ICD-10-CM

## 2023-07-07 DIAGNOSIS — E1165 Type 2 diabetes mellitus with hyperglycemia: Secondary | ICD-10-CM

## 2023-07-07 LAB — POCT GLYCOSYLATED HEMOGLOBIN (HGB A1C): Hemoglobin A1C: 7.9 % — AB (ref 4.0–5.6)

## 2023-07-07 MED ORDER — TRESIBA FLEXTOUCH 200 UNIT/ML ~~LOC~~ SOPN: 100.0000 [IU] | PEN_INJECTOR | Freq: Every day | SUBCUTANEOUS | 3 refills | Status: DC

## 2023-07-07 MED ORDER — MOUNJARO 5 MG/0.5ML ~~LOC~~ SOAJ: 5.0000 mg | SUBCUTANEOUS | 3 refills | Status: DC

## 2023-07-07 MED ORDER — GLOBAL EASE INJECT PEN NEEDLES 32G X 4 MM MISC: 1.0000 | Freq: Four times a day (QID) | 3 refills | Status: DC

## 2023-07-07 MED ORDER — NOVOLOG FLEXPEN 100 UNIT/ML ~~LOC~~ SOPN: 16.0000 [IU] | PEN_INJECTOR | Freq: Three times a day (TID) | SUBCUTANEOUS | 12 refills | Status: DC

## 2023-07-07 NOTE — Progress Notes (Signed)
 Name: Kathleen Hatfield  Age/ Sex: 68 y.o., female   MRN/ DOB: [id number], [birth year]     PCP: Donnie Galea, MD   Reason for Endocrinology Evaluation: Type 2 Diabetes Mellitus  Initial Endocrine Consultative Visit: 05/14/2021    PATIENT IDENTIFIER: Kathleen Hatfield is a 68 y.o. female with a past medical history of DM, CHF, COPS. OSA on CPAP, GERD. The patient has followed with Endocrinology clinic since 05/14/2021 for consultative assistance with management of her diabetes.  DIABETIC HISTORY:  Kathleen Hatfield was diagnosed with DM 2017, and started insulin  therapyin 2000. Her hemoglobin A1c has ranged from 7.4% in 2017, peaking at 11.5% in 2022.   She was seen by Dr. Washington Hacker once 05/2021  Ozempic  discontinued by PCP due to nausea and abdominal 02/2023  SUBJECTIVE:   During the last visit (04/09/2023): A1c 10.3%     Today (07/07/2023): Kathleen Hatfield is here for a follow up on diabetes management..  She checks her  glucose through dexcom G7.  She has no hypoglycemia.  She has chronic UTIs- sees urology on chronic Abx  She presented to the ED 12/2022 with shortness of breath and hyperglycemia, BG 550 Mg/DL the morning of her presentation  Denies nausea or vomiting  Denies constipation or diarrhea     HOME DIABETES REGIMEN:  Mounjaro  2.5 mg weekly  Tresiba  100 units at bedtime  Novolog  16 units TIDQAC    Statin: on repatha  ACE-I/ARB: no   CONTINUOUS GLUCOSE MONITORING RECORD INTERPRETATION    Dates of Recording:4/14-4/30/2025  Sensor description:dexcom  Results statistics:   CGM use % of time 88  Average and SD 197/44  Time in range 39 %  % Time Above 180 47  % Time above 250 14  % Time Below target 0   Glycemic patterns summary: BGs are at the upper limit of normal throughout the night and fluctuate during the day  Hyperglycemic episodes mostly postprandial  Hypoglycemic episodes occurred N/A  Overnight periods: Variable   DIABETIC COMPLICATIONS: Microvascular  complications:  Neuropathy, CKD III Denies:  Last Eye Exam: Completed 12/2019  Macrovascular complications:  CAD Denies:  CVA, PVD   HISTORY:  Past Medical History:  Past Medical History:  Diagnosis Date   Anemia    Arthritis    Asthma    Chronic diastolic CHF 05/2014   Echocardiogram 05/2019: EF 70, no RWMA, mild LVH, Gr 1 DD, normal RVSF, severe LVH, borderline asc Aorta (39 mm)   CKD (chronic kidney disease)    Depression    Diabetes mellitus without complication (HCC)    Diverticulitis    Dyspnea    with exertion   GERD (gastroesophageal reflux disease)    History of blood transfusion    Hyperlipidemia    cannot tolerate statins   Hypertension    patient states she has never had high blood pressure.    Nuclear stress test    Myoview  05/2019: EF 83, no ischemia or infarction; Low Risk   Peripheral neuropathy    Pneumonia    PONV (postoperative nausea and vomiting)    RLS (restless legs syndrome)    Sleep apnea    uses CPAP   Ulcerative colitis (HCC)    dr Tova Fresh   Past Surgical History:  Past Surgical History:  Procedure Laterality Date   ABDOMINAL HYSTERECTOMY     APPENDECTOMY     BIOPSY  10/10/2021   Procedure: BIOPSY;  Surgeon: Alvis Jourdain, MD;  Location: [facility];  Service: Gastroenterology;;  BREAST BIOPSY Left    BREAST SURGERY     left biopsy   CARDIAC CATHETERIZATION     CESAREAN SECTION     CHOLECYSTECTOMY     COLONOSCOPY WITH PROPOFOL  N/A 10/10/2021   Procedure: COLONOSCOPY WITH PROPOFOL ;  Surgeon: Alvis Jourdain, MD;  Location: [facility];  Service: Gastroenterology;  Laterality: N/A;   HERNIA REPAIR     INSERTION OF MESH N/A 11/15/2015   Procedure: INSERTION OF MESH;  Surgeon: Candyce Champagne, MD;  Location: [facility];  Service: General;  Laterality: N/A;   LAPAROSCOPIC LYSIS OF ADHESIONS N/A 11/15/2015   Procedure: LAPAROSCOPIC LYSIS OF ADHESIONS;  Surgeon: Candyce Champagne, MD;  Location: [facility];  Service: General;  Laterality: N/A;   RIGHT HEART  CATHETERIZATION N/A 02/27/2013   Procedure: RIGHT HEART CATH;  Surgeon: Arlander Bellman, MD;  Location: [facility] CATH LAB;  Service: Cardiovascular;  Laterality: N/A;   RIGHT HEART CATHETERIZATION N/A 12/14/2013   Procedure: RIGHT HEART CATH;  Surgeon: Darlis Eisenmenger, MD;  Location: [facility] CATH LAB;  Service: Cardiovascular;  Laterality: N/A;   SIGMOIDECTOMY  2010   diverticular disease   VENTRAL HERNIA REPAIR N/A 11/15/2015   Procedure: LAPAROSCOPIC VENTRAL WALL HERNIA REPAIR;  Surgeon: Candyce Champagne, MD;  Location: [facility];  Service: General;  Laterality: N/A;   Social History:  reports that she has never smoked. She has been exposed to tobacco smoke. She has never used smokeless tobacco. She reports that she does not drink alcohol and does not use drugs. Family History:  Family History  Problem Relation Age of Onset   Heart disease Mother    Hypertension Mother    Dementia Mother    Heart disease Father    COPD Father    Hypertension Father    Heart disease Brother    Other Brother        knee replacement; hip replacement   Other Brother        cant brathe when laying down   Diabetes Maternal Grandmother    Breast cancer Neg Hx    Colon cancer Neg Hx      HOME MEDICATIONS: Allergies as of 07/07/2023       Reactions   Almond Oil Anaphylaxis, Shortness Of Breath, Swelling   Morphine  And Codeine  Shortness Of Breath, Other (See Comments)   Pt. States while in the hospital it affected her breathing, O2 dropped to the 70's   Primidone  Shortness Of Breath       Atorvastatin Other (See Comments)   Leg weakness   Ceclor [cefaclor] Nausea And Vomiting   Latex Rash   Material used for CPAP strap caused severe skin irritation.  Not sure if she is allergic to all latex   Sulfa  Antibiotics Nausea And Vomiting   Ciprofloxacin  Other (See Comments)   Makes joints and muscles ache   Levaquin  [levofloxacin ] Other (See Comments)   Body aches   Losartan  Other (See Comments)   Weakness    Peanut-containing Drug Products    Pt states she thinks it is causing gout flare-ups   Repatha  [evolocumab ]    Aches   Statins Other (See Comments)   Leg and body weakness        Medication List        Accurate as of July 07, 2023  2:08 PM. If you have any questions, ask your nurse or doctor.          acetaminophen  500 MG tablet Commonly known as: TYLENOL  Take 1,000 mg by  mouth 2 (two) times daily.   albuterol  108 (90 Base) MCG/ACT inhaler Commonly known as: VENTOLIN  HFA Inhale 2 puffs into the lungs 2 (two) to 3 (three) times daily as needed for shortness of breath/wheeze.   arformoterol  15 MCG/2ML Nebu Commonly known as: BROVANA  Take 2 mLs (15 mcg total) by nebulization 2 (two) times daily.   benzonatate  200 MG capsule Commonly known as: TESSALON  Take 1 capsule (200 mg total) by mouth 3 (three) times daily as needed for cough.   budesonide  0.5 MG/2ML nebulizer solution Commonly known as: Pulmicort  Take 2 mLs (0.5 mg total) by nebulization 2 (two) times daily.   buPROPion  300 MG 24 hr tablet Commonly known as: WELLBUTRIN  XL Take 1 tablet (300 mg total) by mouth in the morning.   colchicine  0.6 MG tablet TAKE 1 TABLET (0.6 MG TOTAL) BY MOUTH DAILY AS NEEDED (FOR GOUT)   dicyclomine  20 MG tablet Commonly known as: BENTYL  Take 1 tablet (20 mg total) by mouth 4 (four) times daily as needed for GI cramping, pain, nausea/vomiting.   DULoxetine  60 MG capsule Commonly known as: CYMBALTA  Take 1 capsule (60 mg total) by mouth at bedtime.   ferrous sulfate  325 (65 FE) MG tablet Take 1 tablet (325 mg total) by mouth every other day.   furosemide  20 MG tablet Commonly known as: LASIX  Take 3 tablets (60 mg total) by mouth 2 (two) times daily.   gabapentin  300 MG capsule Commonly known as: NEURONTIN  Take 1 capsule (300 mg total) by mouth 2 (two) times daily.   Global Ease Inject Pen Needles 32G X 4 MM Misc Generic drug: Insulin  Pen Needle Use for insulin  in the  morning, at noon, in the evening, and at bedtime.   ipratropium-albuterol  0.5-2.5 (3) MG/3ML Soln Commonly known as: DUONEB Take 3 mLs by nebulization every 6 (six) hours as needed (wheezing).   ketoconazole  2 % cream Commonly known as: NIZORAL  Apply 1 Application topically 2 (two) times daily.   mesalamine  1.2 g EC tablet Commonly known as: LIALDA  Take 2 tablets (2.4 g total) by mouth 2 (two) times daily.   metolazone  5 MG tablet Commonly known as: ZAROXOLYN  Take 1 tablet (5 mg total) by mouth daily as needed.   nitrofurantoin  100 MG capsule Commonly known as: MACRODANTIN  Take 1 capsule (100 mg total) by mouth daily.   NovoLOG  FlexPen 100 UNIT/ML FlexPen Generic drug: insulin  aspart Inject 16 Units into the skin 3 (three) times daily with meals. And at bedtime.  Per sliding scale.  Max daily 45 units   ondansetron  4 MG tablet Commonly known as: ZOFRAN  Take 1 tablet (4 mg total) by mouth every 8 (eight) hours as needed for nausea and vomiting   optichamber diamond  Misc Use as dirtected with inhaler   OXYGEN  Inhale 2 L into the lungs continuous.   potassium chloride  10 MEQ CR capsule Commonly known as: MICRO-K  Take 2 capsules (20 mEq total) by mouth in the morning AND 1 capsule (10 mEq total) daily at 12 noon AND 2 capsules (20 mEq total) every evening.   Praluent  150 MG/ML Soaj Generic drug: Alirocumab  Inject 1 mL (150 mg total) into the skin every 14 (fourteen) days.   rOPINIRole  4 MG tablet Commonly known as: REQUIP  Take 1 tablet (4 mg total) by mouth at bedtime.   spironolactone  25 MG tablet Commonly known as: ALDACTONE  Take 0.5 tablets (12.5 mg total) by mouth daily.   tirzepatide  2.5 MG/0.5ML Pen Commonly known as: MOUNJARO  Inject 2.5 mg into the  skin once a week.   traMADol  50 MG tablet Commonly known as: ULTRAM  Take 1 tablet (50 mg total) by mouth 3 (three) times daily as needed.   Tresiba  FlexTouch 200 UNIT/ML FlexTouch Pen Generic drug: insulin   degludec Inject 110 Units into the skin daily.   triamcinolone  cream 0.1 % Commonly known as: KENALOG  Apply 1 Application topically 2 (two) times daily as needed.   Vitamin D -3 125 MCG (5000 UT) Tabs Take 5,000 Units by mouth every other day.         OBJECTIVE:   Vital Signs: BP 120/80 (BP Location: Left Arm, Patient Position: Sitting, Cuff Size: Normal)   Pulse 96   Ht 5\' 5"  (1.651 m)   Wt 253 lb (114.8 kg) Comment: patient reported  SpO2 98%   BMI 42.10 kg/m   Wt Readings from Last 3 Encounters:  07/07/23 253 lb (114.8 kg)  05/31/23 262 lb 9.1 oz (119.1 kg)  04/14/23 255 lb 12.8 oz (116 kg)     Exam: General: Pt appears well and is in NAD In a wheel chair   Lungs: Clear with good BS bilat   Heart: RRR   Extremities: No pretibial edema.   Neuro: MS is good with appropriate affect, pt is alert and Ox3   DM Foot Exam 04/09/2023  The skin of the feet is intact without sores or ulcerations. The pedal pulses are 2+ on right and 2+ on left. The sensation is decreased to a screening 5.07, 10 gram monofilament bilaterally   DATA REVIEWED:  Lab Results  Component Value Date   HGBA1C 7.9 (A) 07/07/2023   HGBA1C 10.3 (H) 03/18/2023   HGBA1C 11.2 (H) 11/26/2022    Latest Reference Range & Units 06/07/23 12:38  Sodium 135 - 145 mEq/L 142  Potassium 3.5 - 5.1 mEq/L 3.7  Chloride 96 - 112 mEq/L 99  CO2 19 - 32 mEq/L 34 (H)  Glucose 70 - 99 mg/dL 454 (H)  BUN 6 - 23 mg/dL 23  Creatinine 0.98 - 1.19 mg/dL 1.47  Calcium  8.4 - 10.5 mg/dL 9.2  GFR >82.95 mL/min 47.50 (L)    ASSESSMENT / PLAN / RECOMMENDATIONS:   1) Type 2 Diabetes Mellitus,Sub-optimally  controlled, With CKD III, neuropathic  and macrovascular complications - Most recent A1c of 7.9 %. Goal A1c < 7.0 %.     -A1c is trending down  --She did not do well with a correction scale, so we discontinued it  -She is intolerant to Ozempic , tolerating Mounjaro  will increase -Unfortunately, she continues to  use NovoLog  haphazardly, and most of the time after the meals, for example today she ate breakfast around 10:30 AM, but she only took her NovoLog  just before her visit at 2:20 pm.  I did emphasize the importance of taking NovoLog  right before the meal or as soon as she starts eating -I have also advised the patient to separate Tresiba  from NovoLog  just so that she does not mix the insulin  accidentally - Not a candidate for SGLT-2i due to chronic UTI's  - Limited glycemic agents due to CKD  - She is unable to supplement urine for MA/CR ratio, she was given a cup to take home so she can submit in the future  MEDICATIONS: Increase Mounjaro   mg weekly Take tresiba  100 units daily  Novolog  16 units before breakfast and supper  EDUCATION / INSTRUCTIONS: BG monitoring instructions: Patient is instructed to check her blood sugars 3 times a day, before each meal . Call [facility] Endocrinology  clinic if: BG persistently < 70  I reviewed the Rule of 15 for the treatment of hypoglycemia in detail with the patient. Literature supplied.    2) Diabetic complications:  Eye: Does not have known diabetic retinopathy.  Neuro/ Feet: Does have known diabetic peripheral neuropathy .  Renal: Patient does  have known baseline CKD. She   is not on an ACEI/ARB at present. She had endorses weakness with losartan        F/U in 4 months    Signed electronically by: Natale Bail, MD  [facility] End[facility]  [facility] [address] [place], [place] [address] Phone: [phone] FAX: [phone]   CC: Donnie Galea, MD [address] [place] [facility] [place] [address] Phone: [phone]  Fax: [phone]  Return to Endocrinology clinic as below: Future Appointments  Date Time Provider Department Center  07/13/2023  2:30 PM Rosi Converse, PA-C [facility] None  07/14/2023  2:00 PM Margaretann Sharper, MD [facility] None  07/15/2023 10:50 AM [facility]  ANNUAL WELLNESS VISIT 1 [facility]

## 2023-07-07 NOTE — Patient Instructions (Addendum)
 Increase  Mounjaro  5 mg weekly  Take Tresiba  100 units DAILY  Novolog  16 units with breakfast and Supper   HOW TO TREAT LOW BLOOD SUGARS (Blood sugar LESS THAN 70 MG/DL) Please follow the RULE OF 15 for the treatment of hypoglycemia treatment (when your (blood sugars are less than 70 mg/dL)   STEP 1: Take 15 grams of carbohydrates when your blood sugar is low, which includes:  3-4 GLUCOSE TABS  OR 3-4 OZ OF JUICE OR REGULAR SODA OR ONE TUBE OF GLUCOSE GEL    STEP 2: RECHECK blood sugar in 15 MINUTES STEP 3: If your blood sugar is still low at the 15 minute recheck --> then, go back to STEP 1 and treat AGAIN with another 15 grams of carbohydrates.

## 2023-07-13 ENCOUNTER — Ambulatory Visit: Payer: Self-pay | Admitting: Physician Assistant

## 2023-07-13 ENCOUNTER — Encounter: Payer: Self-pay | Admitting: Physician Assistant

## 2023-07-13 VITALS — Resp 20 | Ht 65.0 in | Wt 253.0 lb

## 2023-07-13 DIAGNOSIS — R2681 Unsteadiness on feet: Secondary | ICD-10-CM

## 2023-07-13 DIAGNOSIS — R251 Tremor, unspecified: Secondary | ICD-10-CM

## 2023-07-13 DIAGNOSIS — G2581 Restless legs syndrome: Secondary | ICD-10-CM | POA: Diagnosis not present

## 2023-07-13 NOTE — Progress Notes (Signed)
 Assessment/Plan:   Tremors of unclear etiology  Patient was last seen on 11/10/22, at which time she was not taking her primidone  as prescribed (low-dose primidone  at 50 mg, half tablet at bedtime with plans to increase it to one tab at bedtime) and instead she was taking 1/4 tab as needed "only on the bad days", therefore without its full therapeutic benefit, (she was attributing increased SOB to the medicine)-she has COPD.  "I have no intention of going back to the medicine "  Symptoms are improved per patient's report, after she is still taking her chronic UTI medications.  She does not drop objects, she may have some difficulty picking them up from the floor perhaps with a component of her body habitus.    On exam, the tremors are much improved from prior, on intention, not seen at rest or with distraction.  Tremors are intermittent.  Wonder if there is a component of FND affecting her movements. She continues to use a walker to ambulate due to leaning against it because of her history of RLS which is also better controlled, currently on gabapentin  300 mg twice a day and ropinirole  2 mg at bedtime   she has not done PT or OT and she reports that "this is due to high glucose which may interfere with physical therapy "  She states that her mood is better, she is on Wellbutrin  and Cymbalta .  She reports that she does not need psychotherapy.       Recommendations:   Discussed the importance of taking the medications as directed.  Patient does not wish to take the primidone  because she is concerned about breathing issues with this medication Patient takes ropinirole  2 mg  at bedtime and gabapentin   300 mg a  twice a day, does not wish to make any adjustments Resume PT for strength and balance and OT for tremor  once the glucose is at a therapeutic range  Agree with Psychotherapy for depression  She was once again been educated about COPD meds and tremors Follow up in 6 months Look at [facility]  under physical/occupational therapy aids such as  HandiThings gloves Recommend trial weighted spoons/forks as prior mentioned to her   Subjective:    Kathleen Hatfield was seen today in follow up for tremors. She is here  alone.  Tremors are improving as that she made some adjustments in her medication, including her chronic UTI meds.  She may have occasionally some breast tremors if I distracted.  Her tremors and intention are improved.  She does not drop objects but has difficulty picking them. She has trouble buttoning . She denies any drooling or trouble swallowing.    She has to use a walker to ambulate or to lean against it, especially since she has RLS as well and has not been taking her meds as prescribed (see above).  She needs a walker or a wheelchair to ambulate "I am unstable on my feet".  Mood disorder controled although she has not sought psychotherapy         ALLERGIES:   Allergies  Allergen Reactions   Almond Oil Anaphylaxis, Shortness Of Breath and Swelling   Morphine  And Codeine  Shortness Of Breath and Other (See Comments)    Pt. States while in the hospital it affected her breathing, O2 dropped to the 70's   Primidone  Shortness Of Breath        Atorvastatin Other (See Comments)    Leg weakness   Ceclor [  Cefaclor] Nausea And Vomiting   Latex Rash    Material used for CPAP strap caused severe skin irritation.   Not sure if she is allergic to all latex   Sulfa  Antibiotics Nausea And Vomiting   Ciprofloxacin  Other (See Comments)    Makes joints and muscles ache   Levaquin  [Levofloxacin ] Other (See Comments)    Body aches   Losartan  Other (See Comments)    Weakness    Peanut-Containing Drug Products     Pt states she thinks it is causing gout flare-ups   Repatha  [Evolocumab ]     Aches   Statins Other (See Comments)    Leg and body weakness    CURRENT MEDICATIONS:  Outpatient Encounter Medications as of 07/13/2023  Medication Sig   acetaminophen  (TYLENOL ) 500 MG  tablet Take 1,000 mg by mouth 2 (two) times daily.   albuterol  (VENTOLIN  HFA) 108 (90 Base) MCG/ACT inhaler Inhale 2 puffs into the lungs 2 (two) to 3 (three) times daily as needed for shortness of breath/wheeze.   Alirocumab  (PRALUENT ) 150 MG/ML SOAJ Inject 1 mL (150 mg total) into the skin every 14 (fourteen) days.   arformoterol  (BROVANA ) 15 MCG/2ML NEBU Take 2 mLs (15 mcg total) by nebulization 2 (two) times daily.   benzonatate  (TESSALON ) 200 MG capsule Take 1 capsule (200 mg total) by mouth 3 (three) times daily as needed for cough.   budesonide  (PULMICORT ) 0.5 MG/2ML nebulizer solution Take 2 mLs (0.5 mg total) by nebulization 2 (two) times daily.   buPROPion  (WELLBUTRIN  XL) 300 MG 24 hr tablet Take 1 tablet (300 mg total) by mouth in the morning.   Cholecalciferol  (VITAMIN D -3) 5000 UNITS TABS Take 5,000 Units by mouth every other day.    colchicine  0.6 MG tablet TAKE 1 TABLET (0.6 MG TOTAL) BY MOUTH DAILY AS NEEDED (FOR GOUT)   dicyclomine  (BENTYL ) 20 MG tablet Take 1 tablet (20 mg total) by mouth 4 (four) times daily as needed for GI cramping, pain, nausea/vomiting.   DULoxetine  (CYMBALTA ) 60 MG capsule Take 1 capsule (60 mg total) by mouth at bedtime.   ferrous sulfate  325 (65 FE) MG tablet Take 1 tablet (325 mg total) by mouth every other day.   furosemide  (LASIX ) 20 MG tablet Take 3 tablets (60 mg total) by mouth 2 (two) times daily.   gabapentin  (NEURONTIN ) 300 MG capsule Take 1 capsule (300 mg total) by mouth 2 (two) times daily.   insulin  aspart (NOVOLOG  FLEXPEN) 100 UNIT/ML FlexPen Inject 16 Units into the skin 3 (three) times daily with meals.   insulin  degludec (TRESIBA  FLEXTOUCH) 200 UNIT/ML FlexTouch Pen Inject 100 Units into the skin daily.   Insulin  Pen Needle (GLOBAL EASE INJECT PEN NEEDLES) 32G X 4 MM MISC Use for insulin  in the morning, at noon, in the evening, and at bedtime.   ipratropium-albuterol  (DUONEB) 0.5-2.5 (3) MG/3ML SOLN Take 3 mLs by nebulization every 6 (six)  hours as needed (wheezing).   ketoconazole  (NIZORAL ) 2 % cream Apply 1 Application topically 2 (two) times daily.   mesalamine  (LIALDA ) 1.2 g EC tablet Take 2 tablets (2.4 g total) by mouth 2 (two) times daily.   metolazone  (ZAROXOLYN ) 5 MG tablet Take 1 tablet (5 mg total) by mouth daily as needed.   ondansetron  (ZOFRAN ) 4 MG tablet Take 1 tablet (4 mg total) by mouth every 8 (eight) hours as needed for nausea and vomiting   OXYGEN  Inhale 2 L into the lungs continuous.   potassium chloride  (MICRO-K ) 10 MEQ CR  capsule Take 2 capsules (20 mEq total) by mouth in the morning AND 1 capsule (10 mEq total) daily at 12 noon AND 2 capsules (20 mEq total) every evening.   rOPINIRole  (REQUIP ) 4 MG tablet Take 1 tablet (4 mg total) by mouth at bedtime.   Spacer/Aero-Holding Chambers (OPTICHAMBER DIAMOND ) MISC Use as dirtected with inhaler   spironolactone  (ALDACTONE ) 25 MG tablet Take 0.5 tablets (12.5 mg total) by mouth daily.   tirzepatide  (MOUNJARO ) 5 MG/0.5ML Pen Inject 5 mg into the skin once a week.   traMADol  (ULTRAM ) 50 MG tablet Take 1 tablet (50 mg total) by mouth 3 (three) times daily as needed.   triamcinolone  cream (KENALOG ) 0.1 % Apply 1 Application topically 2 (two) times daily as needed.   nitrofurantoin  (MACRODANTIN ) 100 MG capsule Take 1 capsule (100 mg total) by mouth daily. (Patient not taking: Reported on 07/13/2023)   No facility-administered encounter medications on file as of 07/13/2023.    Objective:   PHYSICAL EXAMINATION:    VITALS:   Vitals:   07/13/23 1415  Resp: 20  Weight: 253 lb (114.8 kg)  Height: 5\' 5"  (1.651 m)     GEN:  The patient appears stated age and is in NAD. HEENT:  Normocephalic, atraumatic.  The mucous membranes are moist. The superficial temporal arteries are without ropiness or tenderness.  Neurological examination:  Orientation: The patient is alert and oriented x3. Cranial nerves: There is good facial symmetry with very mild facial hypomimia. The  speech is fluent and clear. Soft palate rises symmetrically and there is no tongue deviation. Hearing is intact to conversational tone. Sensation: Sensation is intact to light touch throughout Motor: Strength is at least antigravity x4.  Movement examination: Tone: There is normal tone in the upper and lower extremities.  No cogwheeling. Abnormal movements: No postural asterixis, very mild B  rest tremor when distracted.  Tremors when holding heavy objects.  No leg or facial tremors Coordination:  There is no  decremation with RAM's or FNF,   Gait and Station: The patient has significant difficulty arising out of a wheel chair without the use of the hands, needs assistance. The patient's stride length is short.  Gait is wide the base and has difficulty turning      Total time spent on today's visit was 26 minutes, including both face-to-face time and nonface-to-face time.  Time included that spent on review of records (prior notes available to me/labs/imaging if pertinent), discussing treatment and goals, answering patient's questions and coordinating care.  Cc:  Donnie Galea, MD

## 2023-07-13 NOTE — Patient Instructions (Addendum)
 Look at [employer] under physical/occupational therapy aids and there is a glove called HandiThings and buy this.   I would also like you to trial weighted spoons/forks Continue the medicines as prescribed  Thursday, Jan 8 at 2:30 pm

## 2023-07-14 ENCOUNTER — Encounter: Payer: Self-pay | Admitting: Pulmonary Disease

## 2023-07-14 ENCOUNTER — Other Ambulatory Visit: Payer: Self-pay | Admitting: Family Medicine

## 2023-07-14 ENCOUNTER — Ambulatory Visit: Payer: Medicare HMO | Admitting: Pulmonary Disease

## 2023-07-14 VITALS — BP 138/85 | HR 92 | Ht 65.0 in | Wt 258.2 lb

## 2023-07-14 DIAGNOSIS — J4489 Other specified chronic obstructive pulmonary disease: Secondary | ICD-10-CM | POA: Diagnosis not present

## 2023-07-14 DIAGNOSIS — R0602 Shortness of breath: Secondary | ICD-10-CM

## 2023-07-14 DIAGNOSIS — J439 Emphysema, unspecified: Secondary | ICD-10-CM

## 2023-07-14 MED ORDER — INCRUSE ELLIPTA 62.5 MCG/ACT IN AEPB: 1.0000 | INHALATION_SPRAY | Freq: Every day | RESPIRATORY_TRACT | 3 refills | Status: AC

## 2023-07-14 NOTE — Progress Notes (Signed)
 Kathleen Hatfield    [id number]    [birth year]  Primary Care Physician:Duncan, Gwynda Leriche, MD  Referring Physician: Donnie Galea, MD [address] [place],  [place] [address]  Chief complaint:   Patient being seen Chronic shortness of breath, chronic respiratory failure on oxygen  supplementation Chronic asthma  HPI:  Recent hospitalization for COPD exacerbation  History of asthma, chronic respiratory failure on oxygen  supplementation  Shortness of breath with activity  She does have bad knees, bad ankles, back pain  Not able to walk much  Shortness of breath with activity  does have a history of obstructive sleep apnea for which he uses CPAP on a nightly basis  No fevers, no chills No significant phlegm production  Had had pulmonary rehab about 2 years ago Had difficulty with controlling her sugars back then - Was also quite limited with what she could do  Was recently prescribed Yupelri , felt Yupelri  was not working as well  Denies any chest pains or chest discomfort  Sleep habits are erratic, will sleep for about 3 to 4 hours but has no scheduled routines More comfortable sleeping in a recliner, staying more upright  Outpatient Encounter Medications as of 07/14/2023  Medication Sig   acetaminophen  (TYLENOL ) 500 MG tablet Take 1,000 mg by mouth 2 (two) times daily.   albuterol  (VENTOLIN  HFA) 108 (90 Base) MCG/ACT inhaler Inhale 2 puffs into the lungs 2 (two) to 3 (three) times daily as needed for shortness of breath/wheeze.   Alirocumab  (PRALUENT ) 150 MG/ML SOAJ Inject 1 mL (150 mg total) into the skin every 14 (fourteen) days.   arformoterol  (BROVANA ) 15 MCG/2ML NEBU Take 2 mLs (15 mcg total) by nebulization 2 (two) times daily.   benzonatate  (TESSALON ) 200 MG capsule Take 1 capsule (200 mg total) by mouth 3 (three) times daily as needed for cough.   budesonide  (PULMICORT ) 0.5 MG/2ML nebulizer solution Take 2 mLs (0.5 mg total) by nebulization 2 (two)  times daily.   buPROPion  (WELLBUTRIN  XL) 300 MG 24 hr tablet Take 1 tablet (300 mg total) by mouth in the morning.   Cholecalciferol  (VITAMIN D -3) 5000 UNITS TABS Take 5,000 Units by mouth every other day.    colchicine  0.6 MG tablet TAKE 1 TABLET (0.6 MG TOTAL) BY MOUTH DAILY AS NEEDED (FOR GOUT)   dicyclomine  (BENTYL ) 20 MG tablet Take 1 tablet (20 mg total) by mouth 4 (four) times daily as needed for GI cramping, pain, nausea/vomiting.   DULoxetine  (CYMBALTA ) 60 MG capsule Take 1 capsule (60 mg total) by mouth at bedtime.   ferrous sulfate  325 (65 FE) MG tablet Take 1 tablet (325 mg total) by mouth every other day.   furosemide  (LASIX ) 20 MG tablet Take 3 tablets (60 mg total) by mouth 2 (two) times daily.   gabapentin  (NEURONTIN ) 300 MG capsule Take 1 capsule (300 mg total) by mouth 2 (two) times daily.   insulin  aspart (NOVOLOG  FLEXPEN) 100 UNIT/ML FlexPen Inject 16 Units into the skin 3 (three) times daily with meals.   insulin  degludec (TRESIBA  FLEXTOUCH) 200 UNIT/ML FlexTouch Pen Inject 100 Units into the skin daily.   Insulin  Pen Needle (GLOBAL EASE INJECT PEN NEEDLES) 32G X 4 MM MISC Use for insulin  in the morning, at noon, in the evening, and at bedtime.   ipratropium-albuterol  (DUONEB) 0.5-2.5 (3) MG/3ML SOLN Take 3 mLs by nebulization every 6 (six) hours as needed (wheezing).   ketoconazole  (NIZORAL ) 2 % cream Apply 1  Application topically 2 (two) times daily.   mesalamine  (LIALDA ) 1.2 g EC tablet Take 2 tablets (2.4 g total) by mouth 2 (two) times daily.   metolazone  (ZAROXOLYN ) 5 MG tablet Take 1 tablet (5 mg total) by mouth daily as needed.   nitrofurantoin  (MACRODANTIN ) 100 MG capsule Take 1 capsule (100 mg total) by mouth daily.   ondansetron  (ZOFRAN ) 4 MG tablet Take 1 tablet (4 mg total) by mouth every 8 (eight) hours as needed for nausea and vomiting   OXYGEN  Inhale 2 L into the lungs continuous.   potassium chloride  (MICRO-K ) 10 MEQ CR capsule TAKE 2 CAPSULES ORALLY IN THE  MORNING AND 1 CAPSULE DAILY AT 12 NOON AND 2 CAPSULES EVERY EVENING   rOPINIRole  (REQUIP ) 4 MG tablet Take 1 tablet (4 mg total) by mouth at bedtime.   Spacer/Aero-Holding Chambers (OPTICHAMBER DIAMOND ) MISC Use as dirtected with inhaler   spironolactone  (ALDACTONE ) 25 MG tablet Take 0.5 tablets (12.5 mg total) by mouth daily.   tirzepatide  (MOUNJARO ) 5 MG/0.5ML Pen Inject 5 mg into the skin once a week.   traMADol  (ULTRAM ) 50 MG tablet Take 1 tablet (50 mg total) by mouth 3 (three) times daily as needed.   triamcinolone  cream (KENALOG ) 0.1 % Apply 1 Application topically 2 (two) times daily as needed.   [DISCONTINUED] potassium chloride  (MICRO-K ) 10 MEQ CR capsule Take 2 capsules (20 mEq total) by mouth in the morning AND 1 capsule (10 mEq total) daily at 12 noon AND 2 capsules (20 mEq total) every evening.   No facility-administered encounter medications on file as of 07/14/2023.    Allergies as of 07/14/2023 - Review Complete 07/14/2023  Allergen Reaction Noted   Almond oil Anaphylaxis, Shortness Of Breath, and Swelling 02/24/2013   Morphine  and codeine  Shortness Of Breath and Other (See Comments) 12/11/2015   Primidone  Shortness Of Breath 06/26/2022   Atorvastatin Other (See Comments) 11/17/2017   Ceclor [cefaclor] Nausea And Vomiting 05/25/2011   Latex Rash 05/28/2023   Sulfa  antibiotics Nausea And Vomiting 05/25/2011   Ciprofloxacin  Other (See Comments) 09/16/2015   Levaquin  [levofloxacin ] Other (See Comments) 02/24/2013   Losartan  Other (See Comments) 11/17/2017   Peanut-containing drug products  07/14/2022   Repatha  [evolocumab ]  06/26/2022   Statins Other (See Comments) 05/18/2018    Past Medical History:  Diagnosis Date   Anemia    Arthritis    Asthma    Chronic diastolic CHF 05/2014   Echocardiogram 05/2019: EF 70, no RWMA, mild LVH, Gr 1 DD, normal RVSF, severe LVH, borderline asc Aorta (39 mm)   CKD (chronic kidney disease)    Depression    Diabetes mellitus without  complication (HCC)    Diverticulitis    Dyspnea    with exertion   GERD (gastroesophageal reflux disease)    History of blood transfusion    Hyperlipidemia    cannot tolerate statins   Hypertension    patient states she has never had high blood pressure.    Nuclear stress test    Myoview  05/2019: EF 83, no ischemia or infarction; Low Risk   Peripheral neuropathy    Pneumonia    PONV (postoperative nausea and vomiting)    RLS (restless legs syndrome)    Sleep apnea    uses CPAP   Ulcerative colitis (HCC)    dr Tova Fresh    Past Surgical History:  Procedure Laterality Date   ABDOMINAL HYSTERECTOMY     APPENDECTOMY     BIOPSY  10/10/2021   Procedure: BIOPSY;  Surgeon: Alvis Jourdain, MD;  Location: [facility];  Service: Gastroenterology;;   BREAST BIOPSY Left    BREAST SURGERY     left biopsy   CARDIAC CATHETERIZATION     CESAREAN SECTION     CHOLECYSTECTOMY     COLONOSCOPY WITH PROPOFOL  N/A 10/10/2021   Procedure: COLONOSCOPY WITH PROPOFOL ;  Surgeon: Alvis Jourdain, MD;  Location: [facility];  Service: Gastroenterology;  Laterality: N/A;   HERNIA REPAIR     INSERTION OF MESH N/A 11/15/2015   Procedure: INSERTION OF MESH;  Surgeon: Candyce Champagne, MD;  Location: [facility];  Service: General;  Laterality: N/A;   LAPAROSCOPIC LYSIS OF ADHESIONS N/A 11/15/2015   Procedure: LAPAROSCOPIC LYSIS OF ADHESIONS;  Surgeon: Candyce Champagne, MD;  Location: [facility];  Service: General;  Laterality: N/A;   RIGHT HEART CATHETERIZATION N/A 02/27/2013   Procedure: RIGHT HEART CATH;  Surgeon: Arlander Bellman, MD;  Location: [facility];  Service: Cardiovascular;  Laterality: N/A;   RIGHT HEART CATHETERIZATION N/A 12/14/2013   Procedure: RIGHT HEART CATH;  Surgeon: Darlis Eisenmenger, MD;  Location: [facility] CATH LAB;  Service: Cardiovascular;  Laterality: N/A;   SIGMOIDECTOMY  2010   diverticular disease   VENTRAL HERNIA REPAIR N/A 11/15/2015   Procedure: LAPAROSCOPIC VENTRAL WALL HERNIA REPAIR;  Surgeon: Candyce Champagne, MD;  Location: [facility];  Service: General;  Laterality: N/A;    Family History  Problem Relation Age of Onset   Heart disease Mother    Hypertension Mother    Dementia Mother    Heart disease Father    COPD Father    Hypertension Father    Heart disease Brother    Other Brother        knee replacement; hip replacement   Other Brother        cant brathe when laying down   Diabetes Maternal Grandmother    Breast cancer Neg Hx    Colon cancer Neg Hx     Social History   Socioeconomic History   Marital status: Divorced    Spouse name: Not on file   Number of children: Not on file   Years of education: Not on file   Highest education level: Associate degree: occupational, Scientist, product/process development, or vocational program  Occupational History   Occupation: unemployed  Tobacco Use   Smoking status: Never    Passive exposure: Past   Smokeless tobacco: Never  Vaping Use   Vaping status: Never Used  Substance and Sexual Activity   Alcohol use: No   Drug use: No   Sexual activity: Not on file  Other Topics Concern   Not on file  Social History Narrative   Live with spouse   Right handed   Drinks caffiene prn   One floor home   retired   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Corporate investment banker Strain: High Risk (07/08/2023)   Overall Financial Resource Strain (CARDIA)    Difficulty of Paying Living Expenses: Hard  Food Insecurity: Food Insecurity Present (07/08/2023)   Hunger Vital Sign    Worried About Running Out of Food in the Last Year: Sometimes true    Ran Out of Food in the Last Year: Sometimes true  Transportation Needs: No Transportation Needs (07/08/2023)   PRAPARE - Administrator, Civil Service (Medical): No    Lack of Transportation (Non-Medical): No  Physical Activity: Inactive (07/08/2023)   Exercise Vital Sign    Days of Exercise per Week: 0 days    Minutes  of Exercise per Session: 10 min  Stress: No Stress Concern Present (07/08/2023)   [employer] of  Occupational Health - Occupational Stress Questionnaire    Feeling of Stress : Not at all  Social Connections: Socially Isolated (07/08/2023)   Social Connection and Isolation Panel [NHANES]    Frequency of Communication with Friends and Family: Once a week    Frequency of Social Gatherings with Friends and Family: Once a week    Attends Religious Services: Never    Database administrator or Organizations: No    Attends Banker Meetings: Never    Marital Status: Living with partner  Intimate Partner Violence: Not At Risk (06/01/2023)   Humiliation, Afraid, Rape, and Kick questionnaire    Fear of Current or Ex-Partner: No    Emotionally Abused: No    Physically Abused: No    Sexually Abused: No    Review of Systems  Respiratory:  Positive for cough and shortness of breath.   Musculoskeletal:  Positive for arthralgias.  Psychiatric/Behavioral:  Positive for sleep disturbance.     Vitals:   07/14/23 1353  BP: 138/85  Pulse: 92  SpO2: 95%     Physical Exam Constitutional:      Appearance: She is obese.  HENT:     Head: Normocephalic.     Nose: Nose normal.     Mouth/Throat:     Mouth: Mucous membranes are moist.  Eyes:     General: No scleral icterus.    Pupils: Pupils are equal, round, and reactive to light.  Cardiovascular:     Rate and Rhythm: Normal rate and regular rhythm.     Heart sounds: No murmur heard.    No friction rub.  Pulmonary:     Effort: No respiratory distress.     Breath sounds: No stridor. No wheezing or rhonchi.  Musculoskeletal:     Cervical back: No rigidity.  Neurological:     Mental Status: She is alert.  Psychiatric:        Mood and Affect: Mood normal.    Data Reviewed: CPAP compliance shows excellent compliance of 100% CPAP of 13 Residual AHI of 0.7  Assessment:  Obstructive sleep apnea -Continue CPAP use  Stage III COPD - Significant bronchodilator response on PFT - On Brovana , Pulmicort  - Will add Incruse,  Yupelri  was prescribed but does not feel it is helping much  Chronic hypoxemic respiratory failure - Continue oxygen  supplementation  Severe deconditioning  Significant musculoskeletal pain and limitations  Requesting for motorized wheelchair as she can at least take 5 steps without having difficulty  She is on weight loss medications, will not be able to have knee surgery until she loses more weight  Plan/Recommendations: Graded activity as tolerated  Continue nebulization treatments  Add Incruse  Schedule for pulmonary function test  Will need a motorized wheelchair to help with getting around  Follow-up in about 6 weeks  I spent 40 minutes dedicated to the care of this patient on the date of this encounter to include previsit review of records, face-to-face time with the patient discussing conditions above, post visit ordering of testing,ordering medications,independentlyinterpreting results, clinical documentation with electronic health record and communicated necessary findings to members of the patient's care team  Order for motorized wheelchair: Patient suffers from chronic respiratory failure, chronic obstructive pulmonary disease, severe musculoskeletal pain and discomfort which impairs their ability to perform daily activities like dressing and grooming in the home.  A cane, crutch, or walker will  not resolve issue with performing activities of daily living. A wheelchair will allow patient to safely perform daily activities, get around. Patient requires a motorized wheelchair to help get around as she is progressively more limited and unable to ambulate to any significant degree at present. Accessories: elevating leg rests (ELRs), wheel locks, extensions and anti-tippers.  Myer Artis MD  Pulmonary and Critical Care 07/14/2023, 1:57 PM  CC: Donnie Galea, MD

## 2023-07-14 NOTE — Patient Instructions (Addendum)
 Continue your breathing treatments  Will add another inhaler in place of Yupelri  that you felt was not working Prescription for Incruse in place of Yupelri    Continue oxygen  supplementation  Referral for pulmonary rehab We will need a breathing study before this is facilitated  We will try and start paperwork for a motorized wheelchair  Contact us  with concerns if you are feeling poorly

## 2023-07-15 ENCOUNTER — Ambulatory Visit: Payer: Medicare HMO

## 2023-07-15 VITALS — Ht 65.0 in | Wt 258.0 lb

## 2023-07-15 DIAGNOSIS — Z0000 Encounter for general adult medical examination without abnormal findings: Secondary | ICD-10-CM | POA: Diagnosis not present

## 2023-07-15 DIAGNOSIS — Z1231 Encounter for screening mammogram for malignant neoplasm of breast: Secondary | ICD-10-CM

## 2023-07-15 DIAGNOSIS — Z1211 Encounter for screening for malignant neoplasm of colon: Secondary | ICD-10-CM

## 2023-07-15 NOTE — Progress Notes (Signed)
 Please attest and cosign this visit due to patients primary care provider not being in the office at the time the visit was completed.    Subjective:   Kathleen Hatfield is a 68 y.o. who presents for a Medicare Wellness preventive visit.  Visit Complete: Virtual I connected with  Kathleen Hatfield on 07/15/23 by a audio enabled telemedicine application and verified that I am speaking with the correct person using two identifiers.  Patient Location: Home  Provider Location: Office/Clinic  I discussed the limitations of evaluation and management by telemedicine. The patient expressed understanding and agreed to proceed.  Vital Signs: Because this visit was a virtual/telehealth visit, some criteria may be missing or patient reported. Any vitals not documented were not able to be obtained and vitals that have been documented are patient reported.  VideoDeclined- This patient declined Librarian, academic. Therefore the visit was completed with audio only.  Persons Participating in Visit: Patient.  AWV Questionnaire: Yes: Patient Medicare AWV questionnaire was completed by the patient on 07/08/23; I have confirmed that all information answered by patient is correct and no changes since this date.  Cardiac Risk Factors include: advanced age (>36men, >73 women);diabetes mellitus;dyslipidemia;female gender;hypertension;sedentary lifestyle;obesity (BMI >30kg/m2)    Objective:    Today's Vitals   07/08/23 1611 07/15/23 1041  Weight:  258 lb (117 kg)  Height:  5\' 5"  (1.651 m)  PainSc: 8     Body mass index is 42.93 kg/m.     07/15/2023   11:00 AM 07/13/2023    2:15 PM 05/31/2023    2:57 PM 05/28/2023    5:26 PM 11/23/2022    3:49 PM 07/14/2022    1:59 PM 06/09/2022    1:15 PM  Advanced Directives  Does Patient Have a Medical Advance Directive? Yes Yes No Unable to assess, patient is non-responsive or altered mental status No Yes Yes  Type of Public librarian Power of  [place];Living will Healthcare Power of [employer] [place];Living will Healthcare Power of Attorney  Does patient want to make changes to medical advance directive?  No - Patient declined    No - Patient declined   Copy of Healthcare Power of Attorney in Chart? Yes - validated most recent copy scanned in chart (See row information) No - copy requested    Yes - validated most recent copy scanned in chart (See row information)   Would patient like information on creating a medical advance directive?   No - Patient declined  No - Patient declined      Current Medications (verified) Outpatient Encounter Medications as of 07/15/2023  Medication Sig   acetaminophen  (TYLENOL ) 500 MG tablet Take 1,000 mg by mouth 2 (two) times daily.   albuterol  (VENTOLIN  HFA) 108 (90 Base) MCG/ACT inhaler Inhale 2 puffs into the lungs 2 (two) to 3 (three) times daily as needed for shortness of breath/wheeze.   Alirocumab  (PRALUENT ) 150 MG/ML SOAJ Inject 1 mL (150 mg total) into the skin every 14 (fourteen) days.   arformoterol  (BROVANA ) 15 MCG/2ML NEBU Take 2 mLs (15 mcg total) by nebulization 2 (two) times daily.   budesonide  (PULMICORT ) 0.5 MG/2ML nebulizer solution Take 2 mLs (0.5 mg total) by nebulization 2 (two) times daily.   buPROPion  (WELLBUTRIN  XL) 300 MG 24 hr tablet Take 1 tablet (300 mg total) by mouth in the morning.   Cholecalciferol  (VITAMIN D -3) 5000 UNITS TABS Take 5,000 Units by mouth every other day.  colchicine  0.6 MG tablet TAKE 1 TABLET (0.6 MG TOTAL) BY MOUTH DAILY AS NEEDED (FOR GOUT)   dicyclomine  (BENTYL ) 20 MG tablet Take 1 tablet (20 mg total) by mouth 4 (four) times daily as needed for GI cramping, pain, nausea/vomiting.   DULoxetine  (CYMBALTA ) 60 MG capsule Take 1 capsule (60 mg total) by mouth at bedtime.   ferrous sulfate  325 (65 FE) MG tablet Take 1 tablet (325 mg total) by mouth every other day.   furosemide  (LASIX ) 20 MG tablet Take 3 tablets (60 mg total) by  mouth 2 (two) times daily.   gabapentin  (NEURONTIN ) 300 MG capsule Take 1 capsule (300 mg total) by mouth 2 (two) times daily.   insulin  aspart (NOVOLOG  FLEXPEN) 100 UNIT/ML FlexPen Inject 16 Units into the skin 3 (three) times daily with meals.   insulin  degludec (TRESIBA  FLEXTOUCH) 200 UNIT/ML FlexTouch Pen Inject 100 Units into the skin daily.   Insulin  Pen Needle (GLOBAL EASE INJECT PEN NEEDLES) 32G X 4 MM MISC Use for insulin  in the morning, at noon, in the evening, and at bedtime.   ipratropium-albuterol  (DUONEB) 0.5-2.5 (3) MG/3ML SOLN Take 3 mLs by nebulization every 6 (six) hours as needed (wheezing).   ketoconazole  (NIZORAL ) 2 % cream Apply 1 Application topically 2 (two) times daily.   mesalamine  (LIALDA ) 1.2 g EC tablet Take 2 tablets (2.4 g total) by mouth 2 (two) times daily.   metolazone  (ZAROXOLYN ) 5 MG tablet Take 1 tablet (5 mg total) by mouth daily as needed.   nitrofurantoin  (MACRODANTIN ) 100 MG capsule Take 1 capsule (100 mg total) by mouth daily.   OXYGEN  Inhale 2 L into the lungs continuous.   potassium chloride  (MICRO-K ) 10 MEQ CR capsule TAKE 2 CAPSULES ORALLY IN THE MORNING AND 1 CAPSULE DAILY AT 12 NOON AND 2 CAPSULES EVERY EVENING   rOPINIRole  (REQUIP ) 4 MG tablet Take 1 tablet (4 mg total) by mouth at bedtime.   Spacer/Aero-Holding Chambers (OPTICHAMBER DIAMOND ) MISC Use as dirtected with inhaler   spironolactone  (ALDACTONE ) 25 MG tablet Take 0.5 tablets (12.5 mg total) by mouth daily.   tirzepatide  (MOUNJARO ) 5 MG/0.5ML Pen Inject 5 mg into the skin once a week.   traMADol  (ULTRAM ) 50 MG tablet Take 1 tablet (50 mg total) by mouth 3 (three) times daily as needed.   triamcinolone  cream (KENALOG ) 0.1 % Apply 1 Application topically 2 (two) times daily as needed.   umeclidinium bromide  (INCRUSE ELLIPTA ) 62.5 MCG/ACT AEPB Inhale 1 puff into the lungs daily.   benzonatate  (TESSALON ) 200 MG capsule Take 1 capsule (200 mg total) by mouth 3 (three) times daily as needed for  cough. (Patient not taking: Reported on 07/15/2023)   ondansetron  (ZOFRAN ) 4 MG tablet Take 1 tablet (4 mg total) by mouth every 8 (eight) hours as needed for nausea and vomiting (Patient not taking: Reported on 07/15/2023)   No facility-administered encounter medications on file as of 07/15/2023.    Allergies (verified) Almond oil, Morphine  and codeine , Primidone , Atorvastatin, Ceclor [cefaclor], Latex, Sulfa  antibiotics, Ciprofloxacin , Levaquin  [levofloxacin ], Losartan , Peanut-containing drug products, Repatha  [evolocumab ], and Statins   History: Past Medical History:  Diagnosis Date   Allergy  Childhood   almonds - anaphylactic shock   Anemia    Anxiety    Arthritis    Asthma    Chronic diastolic CHF 05/2014   Echocardiogram 05/2019: EF 70, no RWMA, mild LVH, Gr 1 DD, normal RVSF, severe LVH, borderline asc Aorta (39 mm)   CKD (chronic kidney disease)  COPD (chronic obstructive pulmonary disease) (HCC) 2016   Depression    Diabetes mellitus without complication (HCC)    Diverticulitis    Dyspnea    with exertion   GERD (gastroesophageal reflux disease)    History of blood transfusion    Hyperlipidemia    cannot tolerate statins   Hypertension    patient states she has never had high blood pressure.    Myocardial infarction [facility]) adult   Nuclear stress test    Myoview  05/2019: EF 83, no ischemia or infarction; Low Risk   Oxygen  deficiency    Peripheral neuropathy    Pneumonia    PONV (postoperative nausea and vomiting)    RLS (restless legs syndrome)    Sleep apnea    uses CPAP   Ulcerative colitis (HCC)    dr Tova Fresh   Past Surgical History:  Procedure Laterality Date   ABDOMINAL HYSTERECTOMY     APPENDECTOMY     BIOPSY  10/10/2021   Procedure: BIOPSY;  Surgeon: Alvis Jourdain, MD;  Location: [facility];  Service: Gastroenterology;;   BREAST BIOPSY Left    BREAST SURGERY     left biopsy   CARDIAC CATHETERIZATION     CESAREAN SECTION     CHOLECYSTECTOMY     COLON  SURGERY  2010   Sigmoid removed   COLONOSCOPY WITH PROPOFOL  N/A 10/10/2021   Procedure: COLONOSCOPY WITH PROPOFOL ;  Surgeon: Alvis Jourdain, MD;  Location: [facility];  Service: Gastroenterology;  Laterality: N/A;   HERNIA REPAIR     INSERTION OF MESH N/A 11/15/2015   Procedure: INSERTION OF MESH;  Surgeon: Candyce Champagne, MD;  Location: [facility];  Service: General;  Laterality: N/A;   LAPAROSCOPIC LYSIS OF ADHESIONS N/A 11/15/2015   Procedure: LAPAROSCOPIC LYSIS OF ADHESIONS;  Surgeon: Candyce Champagne, MD;  Location: [facility];  Service: General;  Laterality: N/A;   RIGHT HEART CATHETERIZATION N/A 02/27/2013   Procedure: RIGHT HEART CATH;  Surgeon: Arlander Bellman, MD;  Location[facility] C[facility] LAB;  Service: Cardiovascular;  Laterality: N/A;   RIGHT HEART CATHETERIZATION N/A 12/14/2013   Procedure: RIGHT HEART CATH;  Surgeon: Darlis Eisenmenger, MD;  Location: [facility] CATH LAB;  Service: Cardiovascular;  Laterality: N/A;   SIGMOIDECTOMY  2010   diverticular disease   TUBAL LIGATION  1986   VENTRAL HERNIA REPAIR N/A 11/15/2015   Procedure: LAPAROSCOPIC VENTRAL WALL HERNIA REPAIR;  Surgeon: Candyce Champagne, MD;  Location: [facility];  Service: General;  Laterality: N/A;   Family History  Problem Relation Age of Onset   Heart disease Mother    Hypertension Mother    Dementia Mother    Varicose Veins Mother    Heart disease Father    COPD Father    Hypertension Father    Hearing loss Father    Heart disease Brother    Other Brother        knee replacement; hip replacement   Other Brother        cant brathe when laying down   Diabetes Maternal Grandmother    Breast cancer Neg Hx    Colon cancer Neg Hx    Social History   Socioeconomic History   Marital status: Divorced    Spouse name: Not on file   Number of children: Not on file   Years of education: Not on file   Highest education level: Associate degree: occupational, Scientist, product/process development, or vocational program  Occupational History   Occupation: unemployed   Tobacco Use   Smoking status: Never  Passive exposure: Past   Smokeless tobacco: Never  Vaping Use   Vaping status: Never Used  Substance and Sexual Activity   Alcohol use: No   Drug use: No   Sexual activity: Not on file  Other Topics Concern   Not on file  Social History Narrative   Live with spouse   Right handed   Drinks caffiene prn   One floor home   retired   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Corporate investment banker Strain: High Risk (07/08/2023)   Overall Financial Resource Strain (CARDIA)    Difficulty of Paying Living Expenses: Hard  Food Insecurity: Food Insecurity Present (07/08/2023)   Hunger Vital Sign    Worried About Running Out of Food in the Last Year: Sometimes true    Ran Out of Food in the Last Year: Sometimes true  Transportation Needs: No Transportation Needs (07/08/2023)   PRAPARE - Administrator, Civil Service (Medical): No    Lack of Transportation (Non-Medical): No  Physical Activity: Inactive (07/15/2023)   Exercise Vital Sign    Days of Exercise per Week: 0 days    Minutes of Exercise per Session: 0 min  Stress: No Stress Concern Present (07/08/2023)   [employer] of Occupational Health - Occupational Stress Questionnaire    Feeling of Stress : Not at all  Social Connections: Socially Isolated (07/08/2023)   Social Connection and Isolation Panel [NHANES]    Frequency of Communication with Friends and Family: Once a week    Frequency of Social Gatherings with Friends and Family: Once a week    Attends Religious Services: Never    Database administrator or Organizations: No    Attends Engineer, structural: Never    Marital Status: Living with partner    Tobacco Counseling Counseling given: Not Answered  Clinical Intake:  Pre-visit preparation completed: Yes  Pain : 0-10 Pain Score: 8  Pain Type: Chronic pain, Neuropathic pain Pain Location: Foot (both feet and legs) Pain Descriptors / Indicators: Aching, Tingling,  Numbness Pain Onset: More than a month ago Pain Frequency: Constant (worsens as ambulate) Pain Relieving Factors: medications, rest and get off feet Effect of Pain on Daily Activities: limits activities and home management  Pain Relieving Factors: medications, rest and get off feet  BMI - recorded: 42.93 Nutritional Status: BMI > 30  Obese Nutritional Risks: None Diabetes: Yes CBG done?: Yes (pt BS 256 after eating; pt says was 187 this am when awaken) CBG resulted in Enter/ Edit results?: No Did pt. bring in CBG monitor from home?: No  Lab Results  Component Value Date   HGBA1C 7.9 (A) 07/07/2023   HGBA1C 10.3 (H) 03/18/2023   HGBA1C 11.2 (H) 11/26/2022     How often do you need to have someone help you when you read instructions, pamphlets, or other written materials from your doctor or pharmacy?: 1 - Never  Interpreter Needed?: No  Comments: others live with her Information entered by :: B.Marti Mclane,LPN   Activities of Daily Living     07/08/2023    4:11 PM 05/28/2023    5:31 PM  In your present state of health, do you have any difficulty performing the following activities:  Hearing? 0   Vision? 0   Difficulty concentrating or making decisions? 1   Walking or climbing stairs? 1   Dressing or bathing? 0   Doing errands, shopping? 1 0  Preparing Food and eating ? Y   Using the Toilet? N  In the past six months, have you accidently leaked urine? N   Do you have problems with loss of bowel control? N   Managing your Medications? N   Housekeeping or managing your Housekeeping? Y     Patient Care Team: Donnie Galea, MD as PCP - General (Family Medicine) Arnoldo Lapping, MD as PCP - Cardiology (Cardiology) Vernell Goldsmith, MD as Consulting Physician (Pulmonary Disease) Candyce Champagne, MD as Consulting Physician (General Surgery) [place], Lorean Rodes, MD (Inactive) as Consulting Physician (Pulmonary Disease) Jonathan Neighbor, [facility] (Inactive) as Pharmacist  (Pharmacist) Sherwood Donath as Physician Assistant (Cardiology) [facility], Julian Obey, MD as Attending Physician (Endocrinology)  Indicate any recent Medical Services you may have received from other than [facility] providers in the past year (date may be approximate).     Assessment:    This is a routine wellness examination for Kathleen Hatfield.  Hearing/Vision screen Hearing Screening - Comments:: Pt says her hearing is good but has tinnitus;hearing sufficient though Vision Screening - Comments:: Pt says her vision is good w/glasses [facility]   Goals Addressed             This Visit's Progress    Increase physical activity   On track    07/15/23-Lose weight      Manage My Medicine   On track    Timeframe:  Long-Range Goal Priority:  High Start Date:        05/23/21                     Expected End Date:   05/24/22                    Follow Up Date April 2023   - call for medicine refill 2 or 3 days before it runs out - call if I am sick and can't take my medicine - keep a list of all the medicines I take; vitamins and herbals too -Utilize [facility] for medication synchronization, packaging and delivery     Why is this important?   These steps will help you keep on track with your medicines.   Notes:      Patient Stated   Not on track    Get knees fixed.       Depression Screen     07/15/2023   10:57 AM 12/08/2022    2:28 PM 09/21/2022   11:04 AM 07/14/2022    1:57 PM 06/26/2022    2:36 PM 05/22/2022   12:31 PM 07/10/2021   11:14 AM  PHQ 2/9 Scores  PHQ - 2 Score 6 6 1 1 6 4 1   PHQ- 9 Score 14 18 16 1 16 20 4     Fall Risk     07/15/2023   10:53 AM 07/14/2023    1:52 PM 07/13/2023    2:20 PM 07/08/2023    4:11 PM 04/14/2023    2:17 PM  Fall Risk   Falls in the past year? 1 0 0 0 0  Number falls in past yr: 0  0 0   Injury with Fall? 0  0 0   Risk for fall due to : Impaired balance/gait;Impaired mobility Impaired mobility;Impaired balance/gait     Follow  up Education provided;Falls prevention discussed  Falls evaluation completed      MEDICARE RISK AT HOME:  Medicare Risk at Home Any stairs in or around the home?: (Patient-Rptd) Yes If so, are there any without  handrails?: (Patient-Rptd) Yes Home free of loose throw rugs in walkways, pet beds, electrical cords, etc?: (Patient-Rptd) No Adequate lighting in your home to reduce risk of falls?: (Patient-Rptd) Yes Life alert?: (Patient-Rptd) No Use of a cane, walker or w/c?: (Patient-Rptd) Yes Grab bars in the bathroom?: (Patient-Rptd) Yes Shower chair or bench in shower?: (Patient-Rptd) Yes Elevated toilet seat or a handicapped toilet?: (Patient-Rptd) No  TIMED UP AND GO:  Was the test performed?  No  Cognitive Function: 6CIT completed        07/15/2023   11:01 AM 07/14/2022    2:01 PM 07/10/2021   11:10 AM  6CIT Screen  What Year? 0 points 0 points 0 points  What month? 0 points 0 points 0 points  What time? 0 points 0 points 0 points  Count back from 20 0 points 0 points 0 points  Months in reverse 0 points 0 points 0 points  Repeat phrase 0 points 6 points 0 points  Total Score 0 points 6 points 0 points    Immunizations Immunization History  Administered Date(s) Administered   Fluad Quad(high Dose 65+) 12/31/2021   Influenza Split 12/01/2012, 01/18/2017   Influenza Whole 12/25/2011   Influenza, High Dose Seasonal PF 12/24/2022   Influenza, Quadrivalent, Recombinant, Inj, Pf 01/19/2017, 12/08/2017   Influenza,inj,Quad PF,6+ Mos 11/27/2013, 12/08/2014, 12/11/2015   Influenza-Unspecified 12/07/2017   Janssen (J&J) SARS-COV-2 Vaccination 06/15/2019   Pneumococcal Conjugate-13 02/17/2017   Pneumococcal Polysaccharide-23 11/27/2013   Rsv, Bivalent, Protein Subunit Rsvpref,pf Pattricia Bores) 12/24/2022   Tdap 04/01/2010   Zoster Recombinant(Shingrix) 10/06/2021, 12/31/2021    Screening Tests Health Maintenance  Topic Date Due   Diabetic kidney evaluation - Urine ACR  Never done    DTaP/Tdap/Td (2 - Td or Tdap) 04/01/2020   Colonoscopy  10/11/2023   Pneumonia Vaccine 28+ Years old (3 of 3 - PPSV23, PCV20 or PCV21) 07/13/2024 (Originally 11/28/2018)   OPHTHALMOLOGY EXAM  10/01/2023   INFLUENZA VACCINE  10/08/2023   HEMOGLOBIN A1C  01/06/2024   FOOT EXAM  04/08/2024   Diabetic kidney evaluation - eGFR measurement  06/06/2024   Medicare Annual Wellness (AWV)  07/14/2024   MAMMOGRAM  09/03/2024   DEXA SCAN  Completed   Hepatitis C Screening  Completed   Zoster Vaccines- Shingrix  Completed   HPV VACCINES  Aged Out   Meningococcal B Vaccine  Aged Out   COVID-19 Vaccine  Discontinued    Health Maintenance  Health Maintenance Due  Topic Date Due   Diabetic kidney evaluation - Urine ACR  Never done   DTaP/Tdap/Td (2 - Td or Tdap) 04/01/2020   Colonoscopy  10/11/2023   Health Maintenance Items Addressed: Mammogram ordered, Referral sent to GI for colonoscopy  Additional Screening:  Vision Screening: Recommended annual ophthalmology exams for early detection of glaucoma and other disorders of the eye.  Dental Screening: Recommended annual dental exams for proper oral hygiene  Community Resource Referral / Chronic Care Management: CRR required this visit?  No   CCM required this visit?  Appt scheduled with PCP for PE in July '25 and OV next week     Plan:     I have personally reviewed and noted the following in the patient's chart:   Medical and social history Use of alcohol, tobacco or illicit drugs  Current medications and supplements including opioid prescriptions. Patient is currently taking opioid prescriptions. Information provided to patient regarding non-opioid alternatives. Patient advised to discuss non-opioid treatment plan with their provider. Functional ability and status Nutritional  status Physical activity Advanced directives List of other physicians Hospitalizations, surgeries, and ER visits in previous 12  months Vitals Screenings to include cognitive, depression, and falls Referrals and appointments  In addition, I have reviewed and discussed with patient certain preventive protocols, quality metrics, and best practice recommendations. A written personalized care plan for preventive services as well as general preventive health recommendations were provided to patient.    Nerissa Bannister, LPN   4/0/9811   After Visit Summary: (MyChart) Due to this being a telephonic visit, the after visit summary with patients personalized plan was offered to patient via MyChart   Notes: Please refer to Routing Comments. Sent to PCP

## 2023-07-15 NOTE — Patient Instructions (Signed)
 Kathleen Hatfield , Thank you for taking time to come for your Medicare Wellness Visit. I appreciate your ongoing commitment to your health goals. Please review the following plan we discussed and let me know if I can assist you in the future.   Referrals/Orders/Follow-Ups/Clinician Recommendations:  Colonoscopy [facility] [address] [place] [address] [place],  [place]  [address] Main: [phone]   You have an order for:  []   2D Mammogram  [x]   3D Mammogram  []   Bone Density     Please call for appointment:  [facility] [address] [place], [place] [address] [phone]  [facility] [address] [place], [place] [address] [phone]  [facility] [address] [place], [place] [address] [phone]  [facility] [place] Bone Density [address] [place], [place] [address] [phone]  [facility] [address] [place], [place] [address] [phone]    Make sure to wear two-piece clothing.  No lotions, powders, or deodorants the day of the appointment. Make sure to bring picture ID and insurance card.  Bring list of medications you are currently taking including any supplements.    This is a list of the screening recommended for you and due dates:  Health Maintenance  Topic Date Due   Yearly kidney health urinalysis for diabetes  Never done   DTaP/Tdap/Td vaccine (2 - Td or Tdap) 04/01/2020   Colon Cancer Screening  10/11/2023   Pneumonia Vaccine (3 of 3 - PPSV23, PCV20 or PCV21) 07/13/2024*   Eye exam for diabetics  10/01/2023   Flu Shot  10/08/2023   Hemoglobin A1C  01/06/2024   Complete foot exam   04/08/2024   Yearly kidney function blood test for diabetes  06/06/2024   Medicare Annual Wellness Visit  07/14/2024   Mammogram  09/03/2024   DEXA scan (bone density measurement)  Completed   Hepatitis C Screening  Completed    Zoster (Shingles) Vaccine  Completed   HPV Vaccine  Aged Out   Meningitis B Vaccine  Aged Out   COVID-19 Vaccine  Discontinued  *Topic was postponed. The date shown is not the original due date.    Advanced directives: (In Chart) A copy of your advanced directives are scanned into your chart should your provider ever need it.  Next Medicare Annual Wellness Visit scheduled for next year: Yes 07/17/24 @ 10:50am televisit  Have you seen your provider in the last 6 months (3 months if uncontrolled diabetes)? Yes

## 2023-07-20 DIAGNOSIS — J449 Chronic obstructive pulmonary disease, unspecified: Secondary | ICD-10-CM | POA: Diagnosis not present

## 2023-07-20 DIAGNOSIS — I10 Essential (primary) hypertension: Secondary | ICD-10-CM | POA: Diagnosis not present

## 2023-07-20 DIAGNOSIS — G4733 Obstructive sleep apnea (adult) (pediatric): Secondary | ICD-10-CM | POA: Diagnosis not present

## 2023-07-22 ENCOUNTER — Encounter: Payer: Self-pay | Admitting: Family Medicine

## 2023-07-22 ENCOUNTER — Ambulatory Visit (INDEPENDENT_AMBULATORY_CARE_PROVIDER_SITE_OTHER): Admitting: Family Medicine

## 2023-07-22 VITALS — BP 136/74 | HR 86 | Temp 98.9°F | Ht 65.0 in

## 2023-07-22 DIAGNOSIS — J9611 Chronic respiratory failure with hypoxia: Secondary | ICD-10-CM

## 2023-07-22 DIAGNOSIS — M6740 Ganglion, unspecified site: Secondary | ICD-10-CM

## 2023-07-22 MED ORDER — PANTOPRAZOLE SODIUM 40 MG PO TBEC: 40.0000 mg | DELAYED_RELEASE_TABLET | Freq: Every morning | ORAL | 1 refills | Status: DC

## 2023-07-22 NOTE — Progress Notes (Signed)
 D/w pt about mobility eval.  She can't walk more than a few steps w/o having knee pain.  She can get short of breath on exertion.  This is a chronic issue.  She would almost certainly qualify for and benefit from a mobility device like a scooter or electric wheelchair.  Discussed that there is little chance of getting this approved without her having the paperwork/application from a power device company.  If she can get that paperwork then we can see about evaluating her and providing necessary documentation.  She agreed and understood.  She had noted that R wrist, ulnar side, is puffy w/o pain.  Normal ROM.  Noted about 1 year ago.  See exam.   She needed refill on protonix .  Rx sent.   Meds, vitals, and allergies reviewed.   ROS: Per HPI unless specifically indicated in ROS section   Nad Ncat Right wrist with normal range of motion but she has some puffiness on the ulnar side.  She has local Inc transillumination, ie suggestive of a cyst.   No bruising or swelling.  Sensation intact.  Normal grip.  No charge for visit.

## 2023-07-22 NOTE — Patient Instructions (Signed)
 Check with the mobility company about options and get the packet of requirements for approval.   Check on scooter vs wheelchair turning radius re: your house dimensions.  Please schedule an appointment after you get the packet.   Take care.  Glad to see you.

## 2023-07-25 DIAGNOSIS — M6740 Ganglion, unspecified site: Secondary | ICD-10-CM | POA: Insufficient documentation

## 2023-07-25 NOTE — Assessment & Plan Note (Signed)
 See above.  She would almost certainly qualify for and benefit from a mobility device like a scooter or electric wheelchair.  Discussed that there is little chance of getting this approved without her having the paperwork/application from a power device company.  If she can get that paperwork then we can see about evaluating her and providing necessary documentation.  She agreed and understood.

## 2023-07-25 NOTE — Assessment & Plan Note (Signed)
 Likely ganglion cyst, see above.

## 2023-08-05 ENCOUNTER — Other Ambulatory Visit: Payer: Self-pay | Admitting: Family Medicine

## 2023-08-09 ENCOUNTER — Other Ambulatory Visit: Payer: Self-pay

## 2023-08-09 MED ORDER — SPIRONOLACTONE 25 MG PO TABS: 12.5000 mg | ORAL_TABLET | Freq: Every day | ORAL | 1 refills | Status: DC

## 2023-08-11 DIAGNOSIS — E1165 Type 2 diabetes mellitus with hyperglycemia: Secondary | ICD-10-CM | POA: Diagnosis not present

## 2023-08-13 ENCOUNTER — Other Ambulatory Visit: Payer: Self-pay | Admitting: Family Medicine

## 2023-08-17 ENCOUNTER — Ambulatory Visit: Admitting: Pulmonary Disease

## 2023-08-17 ENCOUNTER — Encounter (HOSPITAL_BASED_OUTPATIENT_CLINIC_OR_DEPARTMENT_OTHER)

## 2023-08-17 ENCOUNTER — Encounter: Payer: Self-pay | Admitting: Pulmonary Disease

## 2023-08-17 ENCOUNTER — Ambulatory Visit (HOSPITAL_BASED_OUTPATIENT_CLINIC_OR_DEPARTMENT_OTHER): Admitting: Pulmonary Disease

## 2023-08-17 VITALS — BP 140/81 | HR 91 | Ht 65.0 in | Wt 253.0 lb

## 2023-08-17 DIAGNOSIS — R0602 Shortness of breath: Secondary | ICD-10-CM

## 2023-08-17 DIAGNOSIS — J9611 Chronic respiratory failure with hypoxia: Secondary | ICD-10-CM

## 2023-08-17 DIAGNOSIS — G4733 Obstructive sleep apnea (adult) (pediatric): Secondary | ICD-10-CM | POA: Diagnosis not present

## 2023-08-17 LAB — PULMONARY FUNCTION TEST
DL/VA % pred: 149 %
DL/VA: 6.16 ml/min/mmHg/L
DLCO cor % pred: 89 %
DLCO cor: 18.16 ml/min/mmHg
DLCO unc % pred: 89 %
DLCO unc: 18.16 ml/min/mmHg
FEF 25-75 Post: 0.55 L/s
FEF 25-75 Pre: 0.48 L/s
FEF2575-%Change-Post: 13 %
FEF2575-%Pred-Post: 26 %
FEF2575-%Pred-Pre: 23 %
FEV1-%Change-Post: 6 %
FEV1-%Pred-Post: 41 %
FEV1-%Pred-Pre: 38 %
FEV1-Post: 1 L
FEV1-Pre: 0.94 L
FEV1FVC-%Change-Post: 0 %
FEV1FVC-%Pred-Pre: 78 %
FEV6-%Change-Post: 7 %
FEV6-%Pred-Post: 54 %
FEV6-%Pred-Pre: 50 %
FEV6-Post: 1.67 L
FEV6-Pre: 1.55 L
FEV6FVC-%Change-Post: 0 %
FEV6FVC-%Pred-Post: 104 %
FEV6FVC-%Pred-Pre: 103 %
FVC-%Change-Post: 6 %
FVC-%Pred-Post: 52 %
FVC-%Pred-Pre: 49 %
FVC-Post: 1.67 L
FVC-Pre: 1.57 L
Post FEV1/FVC ratio: 60 %
Post FEV6/FVC ratio: 100 %
Pre FEV1/FVC ratio: 60 %
Pre FEV6/FVC Ratio: 99 %
RV % pred: 97 %
RV: 2.15 L
TLC % pred: 77 %
TLC: 4.02 L

## 2023-08-17 NOTE — Progress Notes (Signed)
 Full PFT performed today.

## 2023-08-17 NOTE — Progress Notes (Unsigned)
 Kathleen Hatfield    [id number]    1955/03/24  Primary Care Physician:Duncan, Gwynda Leriche, MD  Referring Physician: Margaretann Sharper, MD [address] [place],  [place] [address]  Chief complaint:   Patient being seen Chronic shortness of breath, chronic respiratory failure on oxygen  supplementation Chronic asthma In for follow-up today  HPI:  Was seen last in the office a few weeks ago, following a recent hospitalization for COPD exacerbation  Has been doing relatively well  Gets short of breath with activity, has significant musculoskeletal pains and limitations  Trying to walk a little bit more but still with difficulty  History of obstructive sleep apnea on CPAP therapy and compliant  Not having any symptoms suggesting an infection at present  Has noticed a little bit of improvement with her breathing with using Incruse  Had had pulmonary rehab about 2 years ago Had difficulty with controlling her sugars back then - Was also quite limited with what she could do  Denies any chest pains or chest discomfort  Sleep habits are erratic, will sleep for about 3 to 4 hours but has no scheduled routines More comfortable sleeping in a recliner, staying more upright  Outpatient Encounter Medications as of 08/17/2023  Medication Sig   acetaminophen  (TYLENOL ) 500 MG tablet Take 1,000 mg by mouth 2 (two) times daily.   albuterol  (VENTOLIN  HFA) 108 (90 Base) MCG/ACT inhaler Inhale 2 puffs into the lungs 2 (two) to 3 (three) times daily as needed for shortness of breath/wheeze.   Alirocumab  (PRALUENT ) 150 MG/ML SOAJ Inject 1 mL (150 mg total) into the skin every 14 (fourteen) days.   arformoterol  (BROVANA ) 15 MCG/2ML NEBU Take 2 mLs (15 mcg total) by nebulization 2 (two) times daily.   budesonide  (PULMICORT ) 0.5 MG/2ML nebulizer solution Take 2 mLs (0.5 mg total) by nebulization 2 (two) times daily.   buPROPion  (WELLBUTRIN  XL) 300 MG 24 hr tablet Take 1 tablet (300 mg  total) by mouth in the morning.   Cholecalciferol  (VITAMIN D -3) 5000 UNITS TABS Take 5,000 Units by mouth every other day.    colchicine  0.6 MG tablet TAKE 1 TABLET (0.6 MG TOTAL) BY MOUTH DAILY AS NEEDED (FOR GOUT)   dicyclomine  (BENTYL ) 20 MG tablet Take 1 tablet (20 mg total) by mouth 4 (four) times daily as needed for GI cramping, pain, nausea/vomiting.   DULoxetine  (CYMBALTA ) 60 MG capsule Take 1 capsule (60 mg total) by mouth at bedtime.   ferrous sulfate  325 (65 FE) MG tablet Take 1 tablet (325 mg total) by mouth every other day.   furosemide  (LASIX ) 20 MG tablet Take 3 tablets (60 mg total) by mouth 2 (two) times daily.   gabapentin  (NEURONTIN ) 300 MG capsule Take 1 capsule (300 mg total) by mouth 2 (two) times daily.   insulin  aspart (NOVOLOG  FLEXPEN) 100 UNIT/ML FlexPen Inject 16 Units into the skin 3 (three) times daily with meals.   insulin  degludec (TRESIBA  FLEXTOUCH) 200 UNIT/ML FlexTouch Pen Inject 100 Units into the skin daily.   Insulin  Pen Needle (GLOBAL EASE INJECT PEN NEEDLES) 32G X 4 MM MISC Use for insulin  in the morning, at noon, in the evening, and at bedtime.   ipratropium-albuterol  (DUONEB) 0.5-2.5 (3) MG/3ML SOLN Take 3 mLs by nebulization every 6 (six) hours as needed (wheezing).   ketoconazole  (NIZORAL ) 2 % cream Apply 1 Application topically 2 (two) times daily.   mesalamine  (LIALDA ) 1.2 g EC tablet Take 2 tablets (  2.4 g total) by mouth 2 (two) times daily.   metolazone  (ZAROXOLYN ) 5 MG tablet Take 1 tablet (5 mg total) by mouth daily as needed.   nitrofurantoin  (MACRODANTIN ) 100 MG capsule Take 1 capsule (100 mg total) by mouth daily.   ondansetron  (ZOFRAN ) 4 MG tablet Take 1 tablet (4 mg total) by mouth every 8 (eight) hours as needed for nausea and vomiting   OXYGEN  Inhale 2 L into the lungs continuous.   pantoprazole  (PROTONIX ) 40 MG tablet Take 1 tablet (40 mg total) by mouth every morning.   potassium chloride  (MICRO-K ) 10 MEQ CR capsule TAKE 2 CAPSULES ORALLY  IN THE MORNING AND 1 CAPSULE DAILY AT 12 NOON AND 2 CAPSULES EVERY EVENING   rOPINIRole  (REQUIP ) 4 MG tablet Take 1 tablet (4 mg total) by mouth at bedtime.   Spacer/Aero-Holding Chambers (OPTICHAMBER DIAMOND ) MISC Use as dirtected with inhaler   spironolactone  (ALDACTONE ) 25 MG tablet Take 0.5 tablets (12.5 mg total) by mouth daily.   tirzepatide  (MOUNJARO ) 5 MG/0.5ML Pen Inject 5 mg into the skin once a week.   traMADol  (ULTRAM ) 50 MG tablet Take 1 tablet (50 mg total) by mouth 3 (three) times daily as needed.   triamcinolone  cream (KENALOG ) 0.1 % Apply 1 Application topically 2 (two) times daily as needed.   umeclidinium bromide  (INCRUSE ELLIPTA ) 62.5 MCG/ACT AEPB Inhale 1 puff into the lungs daily.   [DISCONTINUED] colchicine  0.6 MG tablet TAKE 1 TABLET (0.6 MG TOTAL) BY MOUTH DAILY AS NEEDED (FOR GOUT)   No facility-administered encounter medications on file as of 08/17/2023.    Allergies as of 08/17/2023 - Review Complete 08/17/2023  Allergen Reaction Noted   Almond oil Anaphylaxis, Shortness Of Breath, and Swelling 02/24/2013   Morphine  and codeine  Shortness Of Breath and Other (See Comments) 12/11/2015   Primidone  Shortness Of Breath 06/26/2022   Atorvastatin Other (See Comments) 11/17/2017   Ceclor [cefaclor] Nausea And Vomiting 05/25/2011   Latex Rash 05/28/2023   Sulfa  antibiotics Nausea And Vomiting 05/25/2011   Ciprofloxacin  Other (See Comments) 09/16/2015   Levaquin  [levofloxacin ] Other (See Comments) 02/24/2013   Losartan  Other (See Comments) 11/17/2017   Peanut-containing drug products  07/14/2022   Repatha  [evolocumab ]  06/26/2022   Statins Other (See Comments) 05/18/2018    Past Medical History:  Diagnosis Date   Allergy  Childhood   almonds - anaphylactic shock   Anemia    Anxiety    Arthritis    Asthma    Chronic diastolic CHF 05/2014   Echocardiogram 05/2019: EF 70, no RWMA, mild LVH, Gr 1 DD, normal RVSF, severe LVH, borderline asc Aorta (39 mm)   CKD  (chronic kidney disease)    COPD (chronic obstructive pulmonary disease) (HCC) 2016   Depression    Diabetes mellitus without complication (HCC)    Diverticulitis    Dyspnea    with exertion   GERD (gastroesophageal reflux disease)    History of blood transfusion    Hyperlipidemia    cannot tolerate statins   Hypertension    patient states she has never had high blood pressure.    Myocardial infarction [facility]) adult   Nuclear stress test    Myoview  05/2019: EF 83, no ischemia or infarction; Low Risk   Oxygen  deficiency    Peripheral neuropathy    Pneumonia    PONV (postoperative nausea and vomiting)    RLS (restless legs syndrome)    Sleep apnea    uses CPAP   Ulcerative colitis (HCC)    dr  mann    Past Surgical History:  Procedure Laterality Date   ABDOMINAL HYSTERECTOMY     APPENDECTOMY     BIOPSY  10/10/2021   Procedure: BIOPSY;  Surgeon: Alvis Jourdain, MD;  Location: [facility];  Service: Gastroenterology;;   BREAST BIOPSY Left    BREAST SURGERY     left biopsy   CARDIAC CATHETERIZATION     CESAREAN SECTION     CHOLECYSTECTOMY     COLON SURGERY  2010   Sigmoid removed   COLONOSCOPY WITH PROPOFOL  N/A 10/10/2021   Procedure: COLONOSCOPY WITH PROPOFOL ;  Surgeon: Alvis Jourdain, MD;  Location: [facility];  Service: Gastroenterology;  Laterality: N/A;   HERNIA REPAIR     INSERTION OF MESH N/A 11/15/2015   Procedure: INSERTION OF MESH;  Surgeon: Candyce Champagne, MD;  Location: [facility];  Service: General;  Laterality: N/A;   LAPAROSCOPIC LYSIS OF ADHESIONS N/A 11/15/2015   Procedure: LAPAROSCOPIC LYSIS OF ADHESIONS;  Surgeon: Candyce Champagne, MD;  Location: [facility];  Service: General;  Laterality: N/A;   RIGHT HEART CATHETERIZATION N/A 02/27/2013   Procedure: RIGHT HEART CATH;  Surgeon: Arlander Bellman, MD;  Location: [facility] CATH LAB;  Service: Cardiovascular;  Laterality: N/A;   RIGHT HEART CATHETERIZATION N/A 12/14/2013   Procedure: RIGHT HEART CATH;  Surgeon: Darlis Eisenmenger, MD;  Location: [facility] CATH LAB;  Service: Cardiovascular;  Laterality: N/A;   SIGMOIDECTOMY  2010   diverticular disease   TUBAL LIGATION  1986   VENTRAL HERNIA REPAIR N/A 11/15/2015   Procedure: LAPAROSCOPIC VENTRAL WALL HERNIA REPAIR;  Surgeon: Candyce Champagne, MD;  Location: [facility];  Service: General;  Laterality: N/A;    Family History  Problem Relation Age of Onset   Heart disease Mother    Hypertension Mother    Dementia Mother    Varicose Veins Mother    Heart disease Father    COPD Father    Hypertension Father    Hearing loss Father    Heart disease Brother    Other Brother        knee replacement; hip replacement   Other Brother        cant brathe when laying down   Diabetes Maternal Grandmother    Breast cancer Neg Hx    Colon cancer Neg Hx     Social History   Socioeconomic History   Marital status: Divorced    Spouse name: Not on file   Number of children: Not on file   Years of education: Not on file   Highest education level: Associate degree: occupational, Scientist, product/process development, or vocational program  Occupational History   Occupation: unemployed  Tobacco Use   Smoking status: Never    Passive exposure: Past   Smokeless tobacco: Never  Vaping Use   Vaping status: Never Used  Substance and Sexual Activity   Alcohol use: No   Drug use: No   Sexual activity: Not on file  Other Topics Concern   Not on file  Social History Narrative   Live with spouse   Right handed   Drinks caffiene prn   One floor home   retired   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Corporate investment banker Strain: High Risk (07/08/2023)   Overall Financial Resource Strain (CARDIA)    Difficulty of Paying Living Expenses: Hard  Food Insecurity: Food Insecurity Present (07/08/2023)   Hunger Vital Sign    Worried About Running Out of Food in the Last Year: Sometimes true    Ran Out  of Food in the Last Year: Sometimes true  Transportation Needs: No Transportation Needs (07/08/2023)   PRAPARE -  Administrator, Civil Service (Medical): No    Lack of Transportation (Non-Medical): No  Physical Activity: Inactive (07/15/2023)   Exercise Vital Sign    Days of Exercise per Week: 0 days    Minutes of Exercise per Session: 0 min  Stress: No Stress Concern Present (07/08/2023)   [employer] of Occupational Health - Occupational Stress Questionnaire    Feeling of Stress : Not at all  Social Connections: Socially Isolated (07/08/2023)   Social Connection and Isolation Panel [NHANES]    Frequency of Communication with Friends and Family: Once a week    Frequency of Social Gatherings with Friends and Family: Once a week    Attends Religious Services: Never    Database administrator or Organizations: No    Attends Banker Meetings: Never    Marital Status: Living with partner  Intimate Partner Violence: Not At Risk (07/15/2023)   Humiliation, Afraid, Rape, and Kick questionnaire    Fear of Current or Ex-Partner: No    Emotionally Abused: No    Physically Abused: No    Sexually Abused: No    Review of Systems  Respiratory:  Positive for cough and shortness of breath.   Musculoskeletal:  Positive for arthralgias.  Psychiatric/Behavioral:  Positive for sleep disturbance.     Vitals:   08/17/23 1047  BP: (!) 140/81  Pulse: 91  SpO2: 91%     Physical Exam Constitutional:      Appearance: She is obese.  HENT:     Head: Normocephalic.     Nose: Nose normal.     Mouth/Throat:     Mouth: Mucous membranes are moist.  Eyes:     General: No scleral icterus.    Pupils: Pupils are equal, round, and reactive to light.  Cardiovascular:     Rate and Rhythm: Normal rate and regular rhythm.     Heart sounds: No murmur heard.    No friction rub.  Pulmonary:     Effort: No respiratory distress.     Breath sounds: No stridor. No wheezing or rhonchi.  Musculoskeletal:     Cervical back: No rigidity.  Neurological:     Mental Status: She is alert.  Psychiatric:         Mood and Affect: Mood normal.   Data Reviewed: CPAP compliance not available for review today  Assessment:  Obstructive sleep apnea - Continue CPAP use  Stage III COPD - Continue bronchodilators - Continue nebulization treatments  Chronic hypoxic respiratory failure - Continue oxygen  supplementation  Deconditioning - Encouraged to continue working on increasing activities  Musculoskeletal pain and discomfort  Overall feeling a little bit better compared to last visit  Plan/Recommendations: Continue nebulization treatments  Continue Incruse - Did note some improvement with her breathing with Incruse  Follow-up in about 4 months  Encouraged to call with significant concerns  She is seeing a weight stabilized with use of semaglutide   I spent 30 minutes dedicated to the care of this patient on the date of this encounter to include previsit review of records, face-to-face time with the patient discussing conditions above, post visit ordering of testing,ordering medications,independentlyinterpreting results, clinical documentation with electronic health record and communicated necessary findings to members of the patient's care team  Myer Artis MD [facility] 08/17/2023, 11:04 AM  CC: Margaretann Sharper, MD

## 2023-08-17 NOTE — Patient Instructions (Signed)
 I will see you in about 4 months  Call us  with significant concerns  Continue inhalers  Continue graded activities as tolerated

## 2023-08-17 NOTE — Patient Instructions (Signed)
 Full PFT performed today.

## 2023-08-20 ENCOUNTER — Telehealth: Payer: Self-pay

## 2023-08-20 DIAGNOSIS — I10 Essential (primary) hypertension: Secondary | ICD-10-CM | POA: Diagnosis not present

## 2023-08-20 DIAGNOSIS — J449 Chronic obstructive pulmonary disease, unspecified: Secondary | ICD-10-CM | POA: Diagnosis not present

## 2023-08-20 DIAGNOSIS — G4733 Obstructive sleep apnea (adult) (pediatric): Secondary | ICD-10-CM | POA: Diagnosis not present

## 2023-08-20 NOTE — Telephone Encounter (Signed)
 Patient is wanted me to let you know that you do not have to worry about setting up for a wheelchair. They purchased one and nothing needs to be done in terms of insurance.

## 2023-08-21 NOTE — Telephone Encounter (Signed)
 Noted. Thanks.

## 2023-09-03 ENCOUNTER — Telehealth (HOSPITAL_BASED_OUTPATIENT_CLINIC_OR_DEPARTMENT_OTHER): Payer: Self-pay | Admitting: *Deleted

## 2023-09-03 NOTE — Telephone Encounter (Signed)
  Returned call to [facility]: [facility] stating pt told them Dr. MALVA had d/c some of her nebs. Brovanna and Budesonide . Confirmed with rep that per las ov note pt was to continue with these meds.  Called pt and she says she told Dr. MALVA she has an abundance of neb meds. She thought he told her to d/c. I explained he was telling her to decline next shipment until she needs more. Pt verbalized understanding

## 2023-09-09 ENCOUNTER — Other Ambulatory Visit: Payer: Self-pay | Admitting: Family Medicine

## 2023-09-09 MED ORDER — BUPROPION HCL ER (XL) 300 MG PO TB24: 300.0000 mg | ORAL_TABLET | Freq: Every morning | ORAL | 2 refills | Status: AC

## 2023-09-14 DIAGNOSIS — G4733 Obstructive sleep apnea (adult) (pediatric): Secondary | ICD-10-CM | POA: Diagnosis not present

## 2023-09-15 ENCOUNTER — Other Ambulatory Visit: Payer: Self-pay | Admitting: Family Medicine

## 2023-09-16 ENCOUNTER — Encounter: Payer: Self-pay | Admitting: Family Medicine

## 2023-09-16 ENCOUNTER — Other Ambulatory Visit: Payer: Self-pay | Admitting: Family Medicine

## 2023-09-16 ENCOUNTER — Ambulatory Visit (INDEPENDENT_AMBULATORY_CARE_PROVIDER_SITE_OTHER): Admitting: Family Medicine

## 2023-09-16 VITALS — BP 142/78 | HR 90 | Temp 99.1°F | Ht 65.0 in | Wt 252.0 lb

## 2023-09-16 DIAGNOSIS — E1169 Type 2 diabetes mellitus with other specified complication: Secondary | ICD-10-CM

## 2023-09-16 DIAGNOSIS — J9611 Chronic respiratory failure with hypoxia: Secondary | ICD-10-CM

## 2023-09-16 DIAGNOSIS — G2581 Restless legs syndrome: Secondary | ICD-10-CM

## 2023-09-16 DIAGNOSIS — Z7189 Other specified counseling: Secondary | ICD-10-CM

## 2023-09-16 DIAGNOSIS — R109 Unspecified abdominal pain: Secondary | ICD-10-CM

## 2023-09-16 DIAGNOSIS — Z0000 Encounter for general adult medical examination without abnormal findings: Secondary | ICD-10-CM

## 2023-09-16 DIAGNOSIS — Z780 Asymptomatic menopausal state: Secondary | ICD-10-CM

## 2023-09-16 DIAGNOSIS — M109 Gout, unspecified: Secondary | ICD-10-CM | POA: Diagnosis not present

## 2023-09-16 DIAGNOSIS — M79646 Pain in unspecified finger(s): Secondary | ICD-10-CM

## 2023-09-16 DIAGNOSIS — E782 Mixed hyperlipidemia: Secondary | ICD-10-CM

## 2023-09-16 DIAGNOSIS — I5032 Chronic diastolic (congestive) heart failure: Secondary | ICD-10-CM

## 2023-09-16 NOTE — Patient Instructions (Addendum)
 Try getting a thumb spica brace.   Go to the lab on the way out.   If you have mychart we'll likely use that to update you.    Take care.  Glad to see you.  You can call for a mammogram at [facility] at [facility].  [address]  [address] [phone]

## 2023-09-16 NOTE — Progress Notes (Signed)
 She had pulmonary f/u in the meantime.  She may have noted some improvement with Incruse.  Still using CPAP at baseline.  On O2 at baseline.    She is still on praluent  for lipids, at baseline.  Labs pending.    CHF.  On Lasix  and spironolactone  at baseline.  Taking metolazone  0-2 times per week.  It helps with swelling/dyspnea.    Hand/wrist pain, thumb pain.  Unclear if from using walker prev, pushing up from bed/chair.  Pain along extensor tendon on the L thumb.  D/w pt about spica brace use.    I asked her to check with GI about UC tx options.   H/o gout.  No flares.  Taking colchicine  at night.  Labs pending.    Some lower abd/suprapubic discomfort.  No burning but urine was darker than typical.  She didn't feel well in general.  No fevers.  Off macrobid  currently.    Sugar was improved but not to goal on last A1c.  D/w pt about getting MALB done today. See notes on labs.    Off iron  currently.  She had GI sx with use.    She was able to get an electric wheelchair.  That helped with mobility.  She can use it in the house and when outside.    RLS on requip .  Taking 4mg .  Usually with leg motion controlled.  If she has troubles it is usually late afternoon with aching noted in the legs.  Tremor/shake has improved.  Occ with extra dose used for leg pain, with relief.    Flu 2024 Shingles prev done.  PNA up to date Tetanus 2012 RSV prev done.  Covid prev done.  Colon cancer screening per GI, Dr. Kristie.  D/w pt.   Breast cancer screening 2024 DXA deferred 2025 Advance directive d/w pt.  Son Ty designated if patient were incapacitated.    Meds, vitals, and allergies reviewed.   ROS: Per HPI unless specifically indicated in ROS section   In wheelchair.  On O2 at baseline.  GEN: nad, alert and oriented HEENT: mucous membranes moist NECK: supple w/o LA CV: rrr.  PULM: ctab, no inc wob ABD: soft, +bs, lower abd not ttp.  EXT: no edema SKIN: no acute rash L thumb pain on  testing extensor tendon w/o tendon failure.  Normal grip otherwise.  Skin well-perfused.  No bruising.

## 2023-09-17 LAB — CBC WITH DIFFERENTIAL/PLATELET
Basophils Absolute: 0.1 K/uL (ref 0.0–0.1)
Basophils Relative: 1.2 % (ref 0.0–3.0)
Eosinophils Absolute: 0.2 K/uL (ref 0.0–0.7)
Eosinophils Relative: 2.1 % (ref 0.0–5.0)
HCT: 41 % (ref 36.0–46.0)
Hemoglobin: 13.7 g/dL (ref 12.0–15.0)
Lymphocytes Relative: 43.6 % (ref 12.0–46.0)
Lymphs Abs: 3.5 K/uL (ref 0.7–4.0)
MCHC: 33.4 g/dL (ref 30.0–36.0)
MCV: 90 fl (ref 78.0–100.0)
Monocytes Absolute: 0.8 K/uL (ref 0.1–1.0)
Monocytes Relative: 9.6 % (ref 3.0–12.0)
Neutro Abs: 3.4 K/uL (ref 1.4–7.7)
Neutrophils Relative %: 43.5 % (ref 43.0–77.0)
Platelets: 241 K/uL (ref 150.0–400.0)
RBC: 4.56 Mil/uL (ref 3.87–5.11)
RDW: 14 % (ref 11.5–15.5)
WBC: 7.9 K/uL (ref 4.0–10.5)

## 2023-09-17 LAB — COMPREHENSIVE METABOLIC PANEL WITH GFR
ALT: 21 U/L (ref 0–35)
AST: 21 U/L (ref 0–37)
Albumin: 4 g/dL (ref 3.5–5.2)
Alkaline Phosphatase: 85 U/L (ref 39–117)
BUN: 17 mg/dL (ref 6–23)
CO2: 35 meq/L — ABNORMAL HIGH (ref 19–32)
Calcium: 9.9 mg/dL (ref 8.4–10.5)
Chloride: 97 meq/L (ref 96–112)
Creatinine, Ser: 1.17 mg/dL (ref 0.40–1.20)
GFR: 47.89 mL/min — ABNORMAL LOW (ref >60.00)
Glucose, Bld: 157 mg/dL — ABNORMAL HIGH (ref 70–99)
Potassium: 3.7 meq/L (ref 3.5–5.1)
Sodium: 140 meq/L (ref 135–145)
Total Bilirubin: 0.4 mg/dL (ref 0.2–1.2)
Total Protein: 6.4 g/dL (ref 6.0–8.3)

## 2023-09-17 LAB — MICROALBUMIN / CREATININE URINE RATIO
Creatinine,U: 181.9 mg/dL
Microalb Creat Ratio: 9.9 mg/g (ref 0.0–30.0)
Microalb, Ur: 1.8 mg/dL (ref 0.0–1.9)

## 2023-09-17 LAB — URINALYSIS, ROUTINE W REFLEX MICROSCOPIC
Bilirubin Urine: NEGATIVE
Hgb urine dipstick: NEGATIVE
Ketones, ur: NEGATIVE
Leukocytes,Ua: NEGATIVE
Nitrite: NEGATIVE
Specific Gravity, Urine: >=1.03 — AB (ref 1.000–1.030)
Total Protein, Urine: NEGATIVE
Urine Glucose: NEGATIVE
Urobilinogen, UA: 0.2 (ref 0.0–1.0)
pH: 6 (ref 5.0–8.0)

## 2023-09-17 LAB — LIPID PANEL
Cholesterol: 208 mg/dL — ABNORMAL HIGH (ref 0–200)
HDL: 37.2 mg/dL — ABNORMAL LOW (ref >39.00)
NonHDL: 171.29
Total CHOL/HDL Ratio: 6
Triglycerides: 799 mg/dL — ABNORMAL HIGH (ref 0.0–149.0)
VLDL: 159.8 mg/dL — ABNORMAL HIGH (ref 0.0–40.0)

## 2023-09-17 LAB — TSH: TSH: 3.95 u[IU]/mL (ref 0.35–5.50)

## 2023-09-17 LAB — URIC ACID: Uric Acid, Serum: 8.1 mg/dL — ABNORMAL HIGH (ref 2.4–7.0)

## 2023-09-17 LAB — LDL CHOLESTEROL, DIRECT: Direct LDL: 84 mg/dL

## 2023-09-17 LAB — VITAMIN B12: Vitamin B-12: 230 pg/mL (ref 211–911)

## 2023-09-19 ENCOUNTER — Ambulatory Visit: Payer: Self-pay | Admitting: Family Medicine

## 2023-09-19 ENCOUNTER — Other Ambulatory Visit: Payer: Self-pay | Admitting: Family Medicine

## 2023-09-19 DIAGNOSIS — J449 Chronic obstructive pulmonary disease, unspecified: Secondary | ICD-10-CM | POA: Diagnosis not present

## 2023-09-19 DIAGNOSIS — R109 Unspecified abdominal pain: Secondary | ICD-10-CM | POA: Insufficient documentation

## 2023-09-19 DIAGNOSIS — Z0000 Encounter for general adult medical examination without abnormal findings: Secondary | ICD-10-CM | POA: Insufficient documentation

## 2023-09-19 DIAGNOSIS — E1169 Type 2 diabetes mellitus with other specified complication: Secondary | ICD-10-CM

## 2023-09-19 DIAGNOSIS — I10 Essential (primary) hypertension: Secondary | ICD-10-CM | POA: Diagnosis not present

## 2023-09-19 DIAGNOSIS — G4733 Obstructive sleep apnea (adult) (pediatric): Secondary | ICD-10-CM | POA: Diagnosis not present

## 2023-09-19 DIAGNOSIS — M79646 Pain in unspecified finger(s): Secondary | ICD-10-CM | POA: Insufficient documentation

## 2023-09-19 NOTE — Assessment & Plan Note (Signed)
 She is still on praluent  for lipids, at baseline.  Labs pending.

## 2023-09-19 NOTE — Assessment & Plan Note (Signed)
 Sugar was improved but not to goal on last A1c.  D/w pt about getting MALB done today. See notes on labs.  Continue insulin  and mounjaro  as is.

## 2023-09-19 NOTE — Assessment & Plan Note (Signed)
 Discussed with patient about trying a thumb spica brace.  She can update me as needed.

## 2023-09-19 NOTE — Assessment & Plan Note (Signed)
Advance directive d/w pt.  Son Ty designated if patient were incapacitated.

## 2023-09-19 NOTE — Assessment & Plan Note (Signed)
 Would continue as needed metolazone  use.  Continue furosemide  potassium spironolactone  at baseline.

## 2023-09-19 NOTE — Assessment & Plan Note (Signed)
 Usually taking 4mg  requip  per day. Occ with extra dose used for leg pain, with relief.  No ADE on med.  Tremor has improved in the meantime.

## 2023-09-19 NOTE — Assessment & Plan Note (Signed)
 No flares.  Taking colchicine  at night.  Labs pending.

## 2023-09-19 NOTE — Assessment & Plan Note (Signed)
 She may have noted some improvement with Incruse.  Still using CPAP at baseline.  On O2 at baseline.  Continue as is.

## 2023-09-19 NOTE — Assessment & Plan Note (Signed)
 H/o, benign exam, see notes on labs.

## 2023-09-19 NOTE — Assessment & Plan Note (Signed)
 Flu 2024 Shingles prev done.  PNA up to date Tetanus 2012 RSV prev done.  Covid prev done.  Colon cancer screening per GI, Dr. Kristie.  D/w pt.   Breast cancer screening 2024 DXA deferred 2025 Advance directive d/w pt.  Son Ty designated if patient were incapacitated.

## 2023-09-28 ENCOUNTER — Telehealth: Payer: Self-pay | Admitting: Family Medicine

## 2023-09-28 ENCOUNTER — Other Ambulatory Visit (INDEPENDENT_AMBULATORY_CARE_PROVIDER_SITE_OTHER)

## 2023-09-28 ENCOUNTER — Other Ambulatory Visit

## 2023-09-28 DIAGNOSIS — E1169 Type 2 diabetes mellitus with other specified complication: Secondary | ICD-10-CM

## 2023-09-28 LAB — LDL CHOLESTEROL, DIRECT: Direct LDL: 80 mg/dL

## 2023-09-28 LAB — LIPID PANEL
Cholesterol: 195 mg/dL (ref 0–200)
HDL: 40.8 mg/dL (ref >39.00)
NonHDL: 154.15
Total CHOL/HDL Ratio: 5
Triglycerides: 670 mg/dL — ABNORMAL HIGH (ref 0.0–149.0)
VLDL: 134 mg/dL — ABNORMAL HIGH (ref 0.0–40.0)

## 2023-09-28 NOTE — Telephone Encounter (Signed)
 Patient would like a referral for Hand and Wrist pain

## 2023-09-30 ENCOUNTER — Other Ambulatory Visit: Payer: Self-pay | Admitting: Family Medicine

## 2023-10-03 ENCOUNTER — Ambulatory Visit: Payer: Self-pay | Admitting: Family Medicine

## 2023-10-03 NOTE — Telephone Encounter (Signed)
 Sent. Thanks.

## 2023-10-05 ENCOUNTER — Ambulatory Visit (INDEPENDENT_AMBULATORY_CARE_PROVIDER_SITE_OTHER)
Admission: RE | Admit: 2023-10-05 | Discharge: 2023-10-05 | Disposition: A | Discharge status: Home / Self Care | Source: Ambulatory Visit | Attending: Family Medicine | Admitting: Family Medicine

## 2023-10-05 ENCOUNTER — Ambulatory Visit: Payer: Self-pay | Admitting: Family Medicine

## 2023-10-05 ENCOUNTER — Ambulatory Visit (INDEPENDENT_AMBULATORY_CARE_PROVIDER_SITE_OTHER): Admitting: Family Medicine

## 2023-10-05 ENCOUNTER — Encounter: Payer: Self-pay | Admitting: Family Medicine

## 2023-10-05 VITALS — BP 128/72 | HR 78 | Temp 98.5°F | Ht 65.0 in | Wt 252.0 lb

## 2023-10-05 DIAGNOSIS — M109 Gout, unspecified: Secondary | ICD-10-CM

## 2023-10-05 DIAGNOSIS — M79642 Pain in left hand: Secondary | ICD-10-CM

## 2023-10-05 DIAGNOSIS — M25532 Pain in left wrist: Secondary | ICD-10-CM | POA: Insufficient documentation

## 2023-10-05 DIAGNOSIS — M7989 Other specified soft tissue disorders: Secondary | ICD-10-CM | POA: Diagnosis not present

## 2023-10-05 DIAGNOSIS — M19042 Primary osteoarthritis, left hand: Secondary | ICD-10-CM | POA: Diagnosis not present

## 2023-10-05 MED ORDER — PREDNISONE 10 MG PO TABS
ORAL_TABLET | ORAL | 0 refills | Status: DC
Start: 2023-10-05 — End: 2023-12-06

## 2023-10-05 NOTE — Patient Instructions (Addendum)
 Continue the brace if it helps Ice if it helps  Xray now -we will reach out with result and plan when it is read   Continue colchicine  if it helps   I will update Dr Cleatus re: what is going on

## 2023-10-05 NOTE — Progress Notes (Signed)
 Subjective:    Patient ID: Kathleen Hatfield, female    DOB: June 28, 1955, 68 y.o.   MRN: [id number]  HPI  Wt Readings from Last 3 Encounters:  10/05/23 252 lb (114.3 kg)  09/16/23 252 lb (114.3 kg)  08/17/23 253 lb (114.8 kg)   41.93 kg/m  Vitals:   10/05/23 1449  BP: 128/72  Pulse: 78  Temp: 98.5 F (36.9 C)  SpO2: 96%   68 yo pt of Dr Cleatus presents with c/o left hand pain    Left hand x couple months. Patient has been wearing a brace x 2 weeks or so. Patient was given prednisone  in the hospital at one point and that helped. Patient states she started getting gout in her toe and feels like she may have gout in her wrist or hand.    Pain is from hand to elbow  Hurts to move fingers or grip (cannot pull pants up)  Going on a couple months - eased off with prednisone  (for breathing)  Thought originally it was from thumb  Now is on the other side also   Back of hand is puffy Has been red and hot at times  Very tender to the touch- can tolerate brace if it is loose  Tried thumb (spica?)- did not tolerate   Has not seen Dr Watt    Has 15 tramadol  left     Has history of gout  Colchicine  0.6 mg daily prn - that helped her hand too   Tramadol  is on med list   Shoulder and foream xr in march were reassuring   Does take lasix  60 mg bid for CHF Also metalazone  Aksi aldactone     Has DM2 Sees endocrinology  Lab Results  Component Value Date   HGBA1C 7.9 (A) 07/07/2023   HGBA1C 10.3 (H) 03/18/2023   HGBA1C 11.2 (H) 11/26/2022     Lab Results  Component Value Date   NA 140 09/16/2023   K 3.7 09/16/2023   CO2 35 (H) 09/16/2023   GLUCOSE 157 (H) 09/16/2023   BUN 17 09/16/2023   CREATININE 1.17 09/16/2023   CALCIUM  9.9 09/16/2023   GFR 47.89 (L) 09/16/2023   EGFR 45 (L) 11/04/2022   GFRNONAA 54 (L) 05/30/2023   Lab Results  Component Value Date   LABURIC 8.1 (H) 09/16/2023  This was down from 13.3)    Lab Results  Component Value Date   ALT 21  09/16/2023   AST 21 09/16/2023   ALKPHOS 85 09/16/2023   BILITOT 0.4 09/16/2023   Lab Results  Component Value Date   WBC 7.9 09/16/2023   HGB 13.7 09/16/2023   HCT 41.0 09/16/2023   MCV 90.0 09/16/2023   PLT 241.0 09/16/2023     Patient Active Problem List   Diagnosis Date Noted   Left wrist pain 10/05/2023   Left hand pain 10/05/2023   Healthcare maintenance 09/19/2023   Abdominal discomfort 09/19/2023   Thumb pain 09/19/2023   Ganglion cyst 07/25/2023   AKI (acute kidney injury) (HCC) 05/29/2023   COPD with acute exacerbation (HCC) 05/28/2023   Recurrent UTI 05/28/2023   Left arm pain 05/28/2023   Hypokalemia 05/28/2023   Chest pain 05/28/2023   Rash 03/21/2023   Tremor 03/21/2023   Allergic rhinitis 01/28/2023   Epigastric pain 01/21/2023   Shortness of breath 11/24/2022   Chronic hypoxic respiratory failure (HCC) 11/24/2022   COPD with hypoxia (HCC) 11/23/2022   Acute bronchitis with COPD (HCC) 11/23/2022   Diabetes  mellitus (HCC) 03/31/2022   Myalgia 02/25/2022   Prolonged Q-T interval on ECG 02/25/2022   Aortic atherosclerosis (HCC) 06/04/2021   Hyperglycemia 02/14/2021   Mixed diabetic hyperlipidemia associated with type 2 diabetes mellitus (HCC) 09/19/2020   Constipation 06/11/2020   Diverticulosis of colon 06/11/2020   Irritable bowel syndrome 06/11/2020   Periumbilical pain 06/11/2020   RLS (restless legs syndrome)    Muscle weakness 01/08/2020   Grade I diastolic dysfunction 01/08/2020   Drug-induced myopathy 10/10/2019   Advance care planning 06/14/2019   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   Hypercalcemia 06/14/2019   Gout 06/14/2019   Acute renal failure superimposed on stage 3a chronic kidney disease (HCC)    Fatigue 06/07/2015   Morbid obesity (HCC) 06/07/2015   Chronic diastolic CHF 05/2014   (HFpEF) heart failure with preserved ejection fraction (HCC)    OSA on CPAP 08/23/2012   Essential hypertension    Depression    Hyperlipidemia     Ulcerative colitis (HCC)    Patellar tendinitis 05/25/2011   Myofascial pain 05/25/2011   Cervicalgia 05/25/2011   Past Medical History:  Diagnosis Date   Allergy  Childhood   almonds - anaphylactic shock   Anemia    Anxiety    Arthritis    Asthma    Chronic diastolic CHF 05/2014   Echocardiogram 05/2019: EF 70, no RWMA, mild LVH, Gr 1 DD, normal RVSF, severe LVH, borderline asc Aorta (39 mm)   CKD (chronic kidney disease)    COPD (chronic obstructive pulmonary disease) (HCC) 2016   Depression    Diabetes mellitus without complication (HCC)    Diverticulitis    Dyspnea    with exertion   GERD (gastroesophageal reflux disease)    History of blood transfusion    Hyperlipidemia    cannot tolerate statins   Hypertension    patient states she has never had high blood pressure.    Myocardial infarction [facility]) adult   Nuclear stress test    Myoview  05/2019: EF 83, no ischemia or infarction; Low Risk   Oxygen  deficiency    Peripheral neuropathy    Pneumonia    PONV (postoperative nausea and vomiting)    RLS (restless legs syndrome)    Sleep apnea    uses CPAP   Ulcerative colitis (HCC)    dr kristie   Past Surgical History:  Procedure Laterality Date   ABDOMINAL HYSTERECTOMY     APPENDECTOMY     BIOPSY  10/10/2021   Procedure: BIOPSY;  Surgeon: Rollin Dover, MD;  Location: [facility];  Service: Gastroenterology;;   BREAST BIOPSY Left    BREAST SURGERY     left biopsy   CARDIAC CATHETERIZATION     CESAREAN SECTION     CHOLECYSTECTOMY     COLON SURGERY  2010   Sigmoid removed   COLONOSCOPY WITH PROPOFOL  N/A 10/10/2021   Procedure: COLONOSCOPY WITH PROPOFOL ;  Surgeon: Rollin Dover, MD;  Location: [facility];  Service: Gastroenterology;  Laterality: N/A;   HERNIA REPAIR     INSERTION OF MESH N/A 11/15/2015   Procedure: INSERTION OF MESH;  Surgeon: Sheldon Standing, MD;  Location: [facility];  Service: General;  Laterality: N/A;   LAPAROSCOPIC LYSIS OF ADHESIONS N/A 11/15/2015    Procedure: LAPAROSCOPIC LYSIS OF ADHESIONS;  Surgeon: Sheldon Standing, MD;  Location: [facility];  Service: General;  Laterality: N/A;   RIGHT HEART CATHETERIZATION N/A 02/27/2013   Procedure: RIGHT HEART CATH;  Surgeon: Ozell JONETTA Fell, MD;  Location: [facility] CATH LAB;  Service: Cardiovascular;  Laterality: N/A;   RIGHT HEART CATHETERIZATION N/A 12/14/2013   Procedure: RIGHT HEART CATH;  Surgeon: Ezra GORMAN Shuck, MD;  Location: [facility] CATH LAB;  Service: Cardiovascular;  Laterality: N/A;   SIGMOIDECTOMY  2010   diverticular disease   TUBAL LIGATION  1986   VENTRAL HERNIA REPAIR N/A 11/15/2015   Procedure: LAPAROSCOPIC VENTRAL WALL HERNIA REPAIR;  Surgeon: Sheldon Standing, MD;  Location: [facility];  Service: General;  Laterality: N/A;   Social History   Tobacco Use   Smoking status: Never    Passive exposure: Past   Smokeless tobacco: Never  Vaping Use   Vaping status: Never Used  Substance Use Topics   Alcohol use: No   Drug use: No   Family History  Problem Relation Age of Onset   Heart disease Mother    Hypertension Mother    Dementia Mother    Varicose Veins Mother    Heart disease Father    COPD Father    Hypertension Father    Hearing loss Father    Heart disease Brother    Other Brother        knee replacement; hip replacement   Other Brother        cant brathe when laying down   Diabetes Maternal Grandmother    Breast cancer Neg Hx    Colon cancer Neg Hx    Allergies  Allergen Reactions   Almond Oil Anaphylaxis, Shortness Of Breath and Swelling   Morphine  And Codeine  Shortness Of Breath and Other (See Comments)    Pt. States while in the hospital it affected her breathing, O2 dropped to the 70's   Primidone  Shortness Of Breath        Atorvastatin Other (See Comments)    Leg weakness   Ceclor [Cefaclor] Nausea And Vomiting   Latex Rash    Material used for CPAP strap caused severe skin irritation.   Not sure if she is allergic to all latex   Sulfa  Antibiotics Nausea And  Vomiting   Ciprofloxacin  Other (See Comments)    Makes joints and muscles ache   Levaquin  [Levofloxacin ] Other (See Comments)    Body aches   Losartan  Other (See Comments)    Weakness    Peanut-Containing Drug Products     Pt states she thinks it is causing gout flare-ups   Repatha  [Evolocumab ]     Aches   Statins Other (See Comments)    Leg and body weakness   Current Outpatient Medications on File Prior to Visit  Medication Sig Dispense Refill   acetaminophen  (TYLENOL ) 500 MG tablet Take 1,000 mg by mouth every 8 (eight) hours as needed.     albuterol  (VENTOLIN  HFA) 108 (90 Base) MCG/ACT inhaler INHALE 2 PUFFS BY MOUTH TWICE A DAY TO 3 TIMES A DAY AS NEEDED FOR WHEEZE /SHORTNESS OF BREATH 18 each 2   Alirocumab  (PRALUENT ) 150 MG/ML SOAJ INJECT 1 ML (150MG ) INTO THE SKIN EVERY 14 DAYS 2 mL 5   arformoterol  (BROVANA ) 15 MCG/2ML NEBU Take 2 mLs (15 mcg total) by nebulization 2 (two) times daily. 360 mL 3   budesonide  (PULMICORT ) 0.5 MG/2ML nebulizer solution Take 2 mLs (0.5 mg total) by nebulization 2 (two) times daily. 360 mL 3   buPROPion  (WELLBUTRIN  XL) 300 MG 24 hr tablet Take 1 tablet (300 mg total) by mouth in the morning. 90 tablet 2   Cholecalciferol  (VITAMIN D -3) 5000 UNITS TABS Take 5,000 Units by mouth every other day.  colchicine  0.6 MG tablet TAKE 1 TABLET (0.6 MG TOTAL) BY MOUTH DAILY AS NEEDED (FOR GOUT) 30 tablet 1   dicyclomine  (BENTYL ) 20 MG tablet Take 1 tablet (20 mg total) by mouth 4 (four) times daily as needed for GI cramping, pain, nausea/vomiting. 60 tablet 1   DULoxetine  (CYMBALTA ) 60 MG capsule Take 1 capsule (60 mg total) by mouth at bedtime. 90 capsule 3   furosemide  (LASIX ) 20 MG tablet Take 3 tablets (60 mg total) by mouth 2 (two) times daily. 540 tablet 2   gabapentin  (NEURONTIN ) 300 MG capsule TAKE 1 CAPSULE BY MOUTH TWICE A DAY 180 capsule 1   insulin  aspart (NOVOLOG  FLEXPEN) 100 UNIT/ML FlexPen Inject 16 Units into the skin 3 (three) times daily with  meals. 45 mL 12   insulin  degludec (TRESIBA  FLEXTOUCH) 200 UNIT/ML FlexTouch Pen Inject 100 Units into the skin daily. 60 mL 3   Insulin  Pen Needle (GLOBAL EASE INJECT PEN NEEDLES) 32G X 4 MM MISC Use for insulin  in the morning, at noon, in the evening, and at bedtime. 400 each 3   ipratropium-albuterol  (DUONEB) 0.5-2.5 (3) MG/3ML SOLN Take 3 mLs by nebulization every 6 (six) hours as needed (wheezing). 360 mL 6   ketoconazole  (NIZORAL ) 2 % cream Apply 1 Application topically 2 (two) times daily. 30 g 1   mesalamine  (LIALDA ) 1.2 g EC tablet Take 2 tablets (2.4 g total) by mouth 2 (two) times daily. 360 tablet 2   metolazone  (ZAROXOLYN ) 5 MG tablet Take 1 tablet (5 mg total) by mouth daily as needed. 30 tablet 0   ondansetron  (ZOFRAN ) 4 MG tablet Take 1 tablet (4 mg total) by mouth every 8 (eight) hours as needed for nausea and vomiting 20 tablet 3   OXYGEN  Inhale 2 L into the lungs continuous.     pantoprazole  (PROTONIX ) 40 MG tablet Take 1 tablet (40 mg total) by mouth every morning. 90 tablet 1   potassium chloride  (MICRO-K ) 10 MEQ CR capsule TAKE 2 CAPSULES ORALLY IN THE MORNING AND 1 CAPSULE DAILY AT 12 NOON AND 2 CAPSULES EVERY EVENING 150 capsule 2   rOPINIRole  (REQUIP ) 4 MG tablet TAKE 1 TABLET AT BEDTIME 90 tablet 1   Spacer/Aero-Holding Chambers (OPTICHAMBER DIAMOND ) MISC Use as dirtected with inhaler 1 each 1   spironolactone  (ALDACTONE ) 25 MG tablet Take 0.5 tablets (12.5 mg total) by mouth daily. 45 tablet 1   tirzepatide  (MOUNJARO ) 5 MG/0.5ML Pen Inject 5 mg into the skin once a week. 6 mL 3   traMADol  (ULTRAM ) 50 MG tablet Take 1 tablet (50 mg total) by mouth 3 (three) times daily as needed. 30 tablet 1   triamcinolone  cream (KENALOG ) 0.1 % Apply 1 Application topically 2 (two) times daily as needed. 30 g 0   umeclidinium bromide  (INCRUSE ELLIPTA ) 62.5 MCG/ACT AEPB Inhale 1 puff into the lungs daily. 30 each 3   No current facility-administered medications on file prior to visit.     Review of Systems     Objective:   Physical Exam Constitutional:      Appearance: Normal appearance. She is obese. She is not ill-appearing or diaphoretic.  Musculoskeletal:     Left wrist: Tenderness and bony tenderness present. No lacerations, snuff box tenderness or crepitus. Decreased range of motion. Normal pulse.     Comments: Left hand  Some tenderness along MCP joints  Mild swelling -back of hand No erythema or crepitus or deformity  Unable to grip due to pain   Left wrist  Limited rom - hurts more to flex than extend Pain to supinate more than pronate No acute swelling or erythema  No deformity  Normal periperal pulses   Skin:    General: Skin is warm and dry.     Coloration: Skin is not jaundiced or pale.     Findings: No bruising, erythema or rash.  Neurological:     Mental Status: She is alert.  Psychiatric:     Comments: Pt voices frustration re; pain            Assessment & Plan:   Problem List Items Addressed This Visit       Other   Left wrist pain - Primary   Several months of wrist and hand pain with some swelling on and off Tenderness also  Pt has history of gout in foot, per pt this feels similar and it did improve when on prednisone  for another reason  No trauma noted and no history of autoimmune joint disease  Taking colchicine   Wearing a brace Exam notes limited rom of wrist and hand with slight dorsal swelling and no erythema  Xray ordered -pend review  Encouraged use of ice , colchicine  Wear brace if helpful   Consider ref to sport med or hand specialist or rheum depending on result  ? If prednisone  would provide relief again  Will make pcp aware  Call back and Er precautions noted in detail today        Relevant Orders   DG Wrist Complete Left (Completed)   DG Hand Complete Left (Completed)   Left hand pain   Taking colchicine   Wearing a brace Exam notes limited rom of wrist and hand with slight dorsal swelling and no  erythema  Xray ordered -pend review  Encouraged use of ice , colchicine  Wear brace if helpful   Consider ref to sport med or hand specialist or rheum depending on result  ? If prednisone  would provide relief again  Will make pcp aware  Call back and Er precautions noted in detail today       Relevant Orders   DG Wrist Complete Left (Completed)   DG Hand Complete Left (Completed)   Gout   History of gout in foot Taking colchicine  Pt wonders if left hand/wrist also have it  On exam-is tender but not much swelling and no erythema   Lab Results  Component Value Date   LABURIC 8.1 (H) 09/16/2023   This was improved from prior  In setting of potential flare, would be apprehensive of proph med

## 2023-10-05 NOTE — Assessment & Plan Note (Signed)
 Taking colchicine   Wearing a brace Exam notes limited rom of wrist and hand with slight dorsal swelling and no erythema  Xray ordered -pend review  Encouraged use of ice , colchicine  Wear brace if helpful   Consider ref to sport med or hand specialist or rheum depending on result  ? If prednisone  would provide relief again  Will make pcp aware  Call back and Er precautions noted in detail today

## 2023-10-05 NOTE — Assessment & Plan Note (Signed)
 Several months of wrist and hand pain with some swelling on and off Tenderness also  Pt has history of gout in foot, per pt this feels similar and it did improve when on prednisone  for another reason  No trauma noted and no history of autoimmune joint disease  Taking colchicine   Wearing a brace Exam notes limited rom of wrist and hand with slight dorsal swelling and no erythema  Xray ordered -pend review  Encouraged use of ice , colchicine  Wear brace if helpful   Consider ref to sport med or hand specialist or rheum depending on result  ? If prednisone  would provide relief again  Will make pcp aware  Call back and Er precautions noted in detail today

## 2023-10-05 NOTE — Assessment & Plan Note (Addendum)
 History of gout in foot Taking colchicine  Pt wonders if left hand/wrist also have it  Reviewed prior pcp notes/ labs and    result notes in detail On exam-is tender but not much swelling and no erythema   Lab Results  Component Value Date   LABURIC 8.1 (H) 09/16/2023   This was improved from prior  In setting of potential flare, would be apprehensive of proph med

## 2023-10-07 ENCOUNTER — Telehealth: Payer: Self-pay

## 2023-10-07 NOTE — Telephone Encounter (Signed)
 Called pt regarding hand/wrist xrays. She did view results and Dr. Graham comments via mychart. Pt did pick up prednisone  and has taken a dose yesterday and today. Pt said her hand is still painful and swollen and is wanting to know what PCP thinks she should do next (per Dr. Graham comments). Please f/u with pt. Will route to PCP because she wants his input on this

## 2023-10-07 NOTE — Telephone Encounter (Signed)
 Copied from CRM [id number]. Topic: Clinical - Lab/Test Results >> Oct 07, 2023 12:20 PM Kathleen Hatfield wrote: Reason for CRM: Patient is returning call for imaging results, please call to go over results - [id number] unable to relay imaging.

## 2023-10-08 NOTE — Telephone Encounter (Signed)
 Please see if the prednisone  helped in the meantime. It may have had time to help by  Friday.    If so, I would continue as is.  If not, then I suspect we need to have her see ortho vs sports med clinic.    Please let me know.  Thanks.

## 2023-10-08 NOTE — Telephone Encounter (Signed)
 Spoke with patient and she stated that the prednisone  is helping some; she's had 3 doses so far. Patient states she will see how she is Monday after taking more of the prednisone  and go from there.

## 2023-10-10 ENCOUNTER — Other Ambulatory Visit: Payer: Self-pay | Admitting: Family Medicine

## 2023-10-11 ENCOUNTER — Ambulatory Visit: Payer: Self-pay

## 2023-10-11 NOTE — Telephone Encounter (Signed)
 FYI Only or Action Required?: FYI only for provider.  Patient was last seen in primary care on 10/05/2023 by Kathleen Laine LABOR, MD.  Called Nurse Triage reporting Hand Pain.  Symptoms began several months ago.  Interventions attempted: Prescription medications: prednisone , tramadol  and Rest, hydration, or home remedies.  Symptoms are: gradually improving.  Triage Disposition: See PCP When Office is Open (Within 3 Days)  Patient/caregiver understands and will follow disposition?: Yes  Copied from CRM #[id number]. Topic: Clinical - Red Word Triage >> Oct 11, 2023  3:59 PM Kathleen Hatfield wrote: Red Word that prompted transfer to Nurse Triage: Worsening Pain >> Oct 11, 2023  4:00 PM Kathleen Hatfield wrote: Patient states she's experiencing some worsening pains in her left hand. Patient was advised on Friday that if pain was still ongoing to call back.  Reason for Disposition  [1] MODERATE pain (e.g., interferes with normal activities) AND [2] present > 3 days  Answer Assessment - Initial Assessment Questions 1. ONSET: When did the pain start?     Few months ago 2. LOCATION: Where is the pain located?     Left wrist 3. PAIN: How bad is the pain? (Scale 1-10; or mild, moderate, severe)     Moderate 4. WORK OR EXERCISE: Has there been any recent work or exercise that involved this part (i.e., hand or wrist) of the body?      5. CAUSE: What do you think is causing the pain?     Unknown 6. AGGRAVATING FACTORS: What makes the pain worse? (e.g., using computer)     movement 7. OTHER SYMPTOMS: Do you have any other symptoms? (e.g., fever, neck pain, numbness or tingling, rash, swelling)      Denies all other symptoms  Additional info:  1) On Prednisone  taper, starts once daily dose today. Swelling has improved slightly. Managing pain with Tramadol .  2) Calling to schedule with sports medicine hand specialist, states she was advised by office staff on sports med appointment if  prednisone  not effective. Scheduled  Protocols used: Hand Pain-A-AH

## 2023-10-12 NOTE — Telephone Encounter (Signed)
 Noted.  Thanks.  Appreciate help from Dr. Geralyn Knee.

## 2023-10-13 ENCOUNTER — Other Ambulatory Visit: Payer: Self-pay | Admitting: Family Medicine

## 2023-10-13 DIAGNOSIS — E1169 Type 2 diabetes mellitus with other specified complication: Secondary | ICD-10-CM

## 2023-10-14 ENCOUNTER — Ambulatory Visit (INDEPENDENT_AMBULATORY_CARE_PROVIDER_SITE_OTHER): Admitting: Family Medicine

## 2023-10-14 ENCOUNTER — Encounter: Payer: Self-pay | Admitting: Family Medicine

## 2023-10-14 VITALS — BP 130/80 | HR 97 | Temp 97.7°F | Ht 65.0 in

## 2023-10-14 DIAGNOSIS — M25532 Pain in left wrist: Secondary | ICD-10-CM | POA: Diagnosis not present

## 2023-10-14 DIAGNOSIS — M1812 Unilateral primary osteoarthritis of first carpometacarpal joint, left hand: Secondary | ICD-10-CM

## 2023-10-14 DIAGNOSIS — Z7901 Long term (current) use of anticoagulants: Secondary | ICD-10-CM

## 2023-10-14 DIAGNOSIS — M10032 Idiopathic gout, left wrist: Secondary | ICD-10-CM

## 2023-10-14 DIAGNOSIS — E1169 Type 2 diabetes mellitus with other specified complication: Secondary | ICD-10-CM

## 2023-10-14 MED ORDER — TRIAMCINOLONE ACETONIDE 40 MG/ML IJ SUSP
20.0000 mg | Freq: Once | INTRAMUSCULAR | Status: AC
  Administered 2023-10-14: 20 mg via INTRA_ARTICULAR

## 2023-10-14 NOTE — Progress Notes (Unsigned)
 Kathleen Gillock T. Day Greb, MD, CAQ Sports Medicine [facility] [address] [place], [address]  Phone: [phone]  FAX: [phone]  Kathleen Hatfield - 68 y.o. female  MRN [id number]  Date of Birth: May 10, 1955  Date: 10/14/2023  PCP: Cleatus Arlyss RAMAN, MD  Referral: Cleatus Arlyss RAMAN, MD  Chief Complaint  Patient presents with   Hand Pain    Left    Subjective:   Kathleen Hatfield is a 68 y.o. very pleasant female patient with Body mass index is 41.93 kg/m. who presents with the following:  L hand and wrist, shooting pains and it aches Patient's pain has been ongoing for roughly 2 months. She does have pain in the wrist joint and the basal joint and to some extent some mild extension into the distal forearm.  She has had some difficulty moving and gripping with some swelling in the true wrist joint. She does have a significant history for gout.  She did take some medication for her great toe and for her wrist. She did take some colchicine  and had some partial relief of symptoms.  She does have persistently elevated uric acid, most recently 8.1.  Uses to push up in a wheelchair  Inject L wrist  Lab Results  Component Value Date   HGBA1C 7.9 (A) 07/07/2023     Review of Systems is noted in the HPI, as appropriate  Objective:   BP 130/80   Pulse 97   Temp 97.7 F (36.5 C) (Temporal)   Ht 5' 5 (1.651 m)   SpO2 96% Comment: 2 L O2  BMI 41.93 kg/m   GEN: No acute distress; alert,appropriate. PULM: Breathing comfortably in no respiratory distress PSYCH: Normally interactive.   Left hand: She does have an obvious wrist effusion Minimal tenderness at the DIP and PIP joints.  Some bogginess at the MCP joints She does have some focal tenderness at the basal joint on the left hand, as well  She is able to make a full composite fist with some delay in use of fingers She has no significant pain at the elbow, which is full range of motion  no tenderness at the epicondyles  There is no significant redness or warmth at the wrist effusion  Laboratory and Imaging Data:  Assessment and Plan:     ICD-10-CM   1. Acute pain of left wrist  M25.532 triamcinolone  acetonide (KENALOG -40) injection 20 mg    2. Acute idiopathic gout of left wrist  M10.032     3. Chronic anticoagulation  Z79.01     4. Type 2 diabetes mellitus with other specified complication, unspecified whether long term insulin  use (HCC)  E11.69     5. Arthritis of carpometacarpal (CMC) joint of left thumb  M18.12      Wrist pain and effusion for 2 to 3 months.  The patient's wrist effusion improved on colchicine , which suggests a gouty component.  She certainly does have evidence of wrist arthritis and an abnormal x-ray.  She has obvious CMC joint arthritis at the first, as well.  I am going to have the patient increase her colchicine  dosing, and we will also do an intra-articular wrist injection to try to calm her down today.  Aspiration/Injection Procedure Note Kathleen Hatfield Jan 06, 1956 Date of procedure: 10/14/2023  Procedure: Medium Joint Aspiration / Injection of Wrist, L Indications: Pain  Procedure Details Patient verbally consented for procedure; risks, benefits, and alternatives explained. Patient prepped with  Chloraprep. Ethyl chloride used for anesthesia. Using sterile technique, just distal to Lister's tubercle, using 3 cc syringe with 22 gauge needle, needle inserted and aspirated, no blood. Then 1/2 cc Lidocaine  1% and Kenalog  20 mg injected without difficulty into wrist.  Pressue applied, minimal blood. Tolerated well, decreased pain, no complications. Medication: 1/2 cc of Kenalog  40 mg (equaling Kenalog  20 mg)   Medication Management during today's office visit: Meds ordered this encounter  Medications   triamcinolone  acetonide (KENALOG -40) injection 20 mg   There are no discontinued medications.  Orders placed today for conditions  managed today: No orders of the defined types were placed in this encounter.   Disposition: No follow-ups on file.  [employer] One speech-to-text software was used for transcription in this dictation.  Possible transcriptional errors can occur using Animal nutritionist.   Signed,  Jacques DASEN. Orel Hord, MD   Outpatient Encounter Medications as of 10/14/2023  Medication Sig   acetaminophen  (TYLENOL ) 500 MG tablet Take 1,000 mg by mouth every 8 (eight) hours as needed.   albuterol  (VENTOLIN  HFA) 108 (90 Base) MCG/ACT inhaler INHALE 2 PUFFS BY MOUTH TWICE A DAY TO 3 TIMES A DAY AS NEEDED FOR WHEEZE /SHORTNESS OF BREATH   Alirocumab  (PRALUENT ) 150 MG/ML SOAJ INJECT 1 ML (150MG ) INTO THE SKIN EVERY 14 DAYS   arformoterol  (BROVANA ) 15 MCG/2ML NEBU Take 2 mLs (15 mcg total) by nebulization 2 (two) times daily.   budesonide  (PULMICORT ) 0.5 MG/2ML nebulizer solution Take 2 mLs (0.5 mg total) by nebulization 2 (two) times daily.   buPROPion  (WELLBUTRIN  XL) 300 MG 24 hr tablet Take 1 tablet (300 mg total) by mouth in the morning.   Cholecalciferol  (VITAMIN D -3) 5000 UNITS TABS Take 5,000 Units by mouth every other day.    colchicine  0.6 MG tablet TAKE 1 TABLET (0.6 MG TOTAL) BY MOUTH DAILY AS NEEDED (FOR GOUT)   dicyclomine  (BENTYL ) 20 MG tablet Take 1 tablet (20 mg total) by mouth 4 (four) times daily as needed for GI cramping, pain, nausea/vomiting.   DULoxetine  (CYMBALTA ) 60 MG capsule Take 1 capsule (60 mg total) by mouth at bedtime.   furosemide  (LASIX ) 20 MG tablet Take 3 tablets (60 mg total) by mouth 2 (two) times daily.   gabapentin  (NEURONTIN ) 300 MG capsule TAKE 1 CAPSULE BY MOUTH TWICE A DAY   insulin  aspart (NOVOLOG  FLEXPEN) 100 UNIT/ML FlexPen Inject 16 Units into the skin 3 (three) times daily with meals.   insulin  degludec (TRESIBA  FLEXTOUCH) 200 UNIT/ML FlexTouch Pen Inject 100 Units into the skin daily.   Insulin  Pen Needle (GLOBAL EASE INJECT PEN NEEDLES) 32G X 4 MM MISC Use for  insulin  in the morning, at noon, in the evening, and at bedtime.   ipratropium-albuterol  (DUONEB) 0.5-2.5 (3) MG/3ML SOLN Take 3 mLs by nebulization every 6 (six) hours as needed (wheezing).   ketoconazole  (NIZORAL ) 2 % cream Apply 1 Application topically 2 (two) times daily.   mesalamine  (LIALDA ) 1.2 g EC tablet Take 2 tablets (2.4 g total) by mouth 2 (two) times daily.   metolazone  (ZAROXOLYN ) 5 MG tablet Take 1 tablet (5 mg total) by mouth daily as needed.   ondansetron  (ZOFRAN ) 4 MG tablet Take 1 tablet (4 mg total) by mouth every 8 (eight) hours as needed for nausea and vomiting   OXYGEN  Inhale 2 L into the lungs continuous.   pantoprazole  (PROTONIX ) 40 MG tablet Take 1 tablet (40 mg total) by mouth every morning.   potassium chloride  (MICRO-K ) 10 MEQ CR  capsule TAKE 2 CAPSULES ORALLY IN THE MORNING AND 1 CAPSULE DAILY AT 12 NOON AND 2 CAPSULES EVERY EVENING   predniSONE  (DELTASONE ) 10 MG tablet Take 3 pills once daily by mouth for 3 days, then 2 pills once daily for 3 days, then 1 pill once daily for 3 days and then stop   rOPINIRole  (REQUIP ) 4 MG tablet TAKE 1 TABLET AT BEDTIME   Spacer/Aero-Holding Chambers (OPTICHAMBER DIAMOND ) MISC Use as dirtected with inhaler   spironolactone  (ALDACTONE ) 25 MG tablet Take 0.5 tablets (12.5 mg total) by mouth daily.   tirzepatide  (MOUNJARO ) 5 MG/0.5ML Pen Inject 5 mg into the skin once a week.   traMADol  (ULTRAM ) 50 MG tablet Take 1 tablet (50 mg total) by mouth 3 (three) times daily as needed.   triamcinolone  cream (KENALOG ) 0.1 % Apply 1 Application topically 2 (two) times daily as needed.   umeclidinium bromide  (INCRUSE ELLIPTA ) 62.5 MCG/ACT AEPB Inhale 1 puff into the lungs daily.   [EXPIRED] triamcinolone  acetonide (KENALOG -40) injection 20 mg    No facility-administered encounter medications on file as of 10/14/2023.

## 2023-10-14 NOTE — Patient Instructions (Addendum)
 Increase the colchicine  to one tablet twice a day - once twice a day

## 2023-10-18 ENCOUNTER — Telehealth: Payer: Self-pay | Admitting: Family Medicine

## 2023-10-18 DIAGNOSIS — R918 Other nonspecific abnormal finding of lung field: Secondary | ICD-10-CM

## 2023-10-18 NOTE — Telephone Encounter (Signed)
 Due for follow up CT re: lung nodules seen last year.  Ordered.  Thanks.

## 2023-10-18 NOTE — Telephone Encounter (Signed)
 Copied from CRM #[id number]. Topic: General - Other >> Oct 18, 2023 10:45 AM Cleave MATSU wrote: Reason for CRM: pt returned missed call please follow back up with pt.

## 2023-10-18 NOTE — Telephone Encounter (Signed)
 Left message with patients brother for her to return call to office

## 2023-10-19 NOTE — Telephone Encounter (Signed)
 Left voicemail for patient to return call to office.

## 2023-10-19 NOTE — Telephone Encounter (Signed)
 Patient notified

## 2023-10-20 DIAGNOSIS — J449 Chronic obstructive pulmonary disease, unspecified: Secondary | ICD-10-CM | POA: Diagnosis not present

## 2023-10-20 DIAGNOSIS — G4733 Obstructive sleep apnea (adult) (pediatric): Secondary | ICD-10-CM | POA: Diagnosis not present

## 2023-10-20 DIAGNOSIS — I10 Essential (primary) hypertension: Secondary | ICD-10-CM | POA: Diagnosis not present

## 2023-10-27 ENCOUNTER — Other Ambulatory Visit: Payer: Self-pay | Admitting: Family Medicine

## 2023-10-27 DIAGNOSIS — M109 Gout, unspecified: Secondary | ICD-10-CM

## 2023-10-27 NOTE — Telephone Encounter (Signed)
 Colchicine  Last filled:  09/28/23, #30 Last OV:  09/16/23, annual exam Next OV:  none

## 2023-10-28 ENCOUNTER — Encounter: Payer: Self-pay | Admitting: *Deleted

## 2023-11-02 ENCOUNTER — Telehealth: Payer: Self-pay

## 2023-11-02 ENCOUNTER — Ambulatory Visit: Admitting: Internal Medicine

## 2023-11-02 NOTE — Progress Notes (Deleted)
 Name: Kathleen Hatfield  Age/ Sex: 68 y.o., female   MRN/ DOB: [id number], 03/29/1955     PCP: Cleatus Arlyss RAMAN, MD   Reason for Endocrinology Evaluation: Type 2 Diabetes Mellitus  Initial Endocrine Consultative Visit: 05/14/2021    PATIENT IDENTIFIER: Kathleen Hatfield is a 68 y.o. female with a past medical history of DM, CHF, COPS. OSA on CPAP, GERD. The patient has followed with Endocrinology clinic since 05/14/2021 for consultative assistance with management of her diabetes.  DIABETIC HISTORY:  Kathleen Hatfield was diagnosed with DM 2017, and started insulin  therapyin 2000. Her hemoglobin A1c has ranged from 7.4% in 2017, peaking at 11.5% in 2022.   She was seen by Dr. Kassie once 05/2021  Ozempic  discontinued by PCP due to nausea and abdominal 02/2023  SUBJECTIVE:   During the last visit (07/07/2023): A1c 7.9%     Today (11/02/2023): Kathleen Hatfield is here for a follow up on diabetes management..  She checks her  glucose through dexcom G7.  She has no hypoglycemia.  She has chronic UTIs- sees urology on chronic Abx  Patient follows with pulmonary for COPD and OSA on CPAP She did receive triamcinolone  injection for left hand/wrist pain 10/14/2023   Denies nausea or vomiting  Denies constipation or diarrhea     HOME DIABETES REGIMEN:  Mounjaro  5 mg weekly  Tresiba  100 units at bedtime  Novolog  16 units TIDQAC    Statin: on repatha  ACE-I/ARB: no   CONTINUOUS GLUCOSE MONITORING RECORD INTERPRETATION    Dates of Recording:4/14-4/30/2025  Sensor description:dexcom  Results statistics:   CGM use % of time 88  Average and SD 197/44  Time in range 39 %  % Time Above 180 47  % Time above 250 14  % Time Below target 0   Glycemic patterns summary: BGs are at the upper limit of normal throughout the night and fluctuate during the day  Hyperglycemic episodes mostly postprandial  Hypoglycemic episodes occurred N/A  Overnight periods: Variable   DIABETIC  COMPLICATIONS: Microvascular complications:  Neuropathy, CKD III Denies:  Last Eye Exam: Completed 12/2019  Macrovascular complications:  CAD Denies:  CVA, PVD   HISTORY:  Past Medical History:  Past Medical History:  Diagnosis Date   Allergy  Childhood   almonds - anaphylactic shock   Anemia    Anxiety    Arthritis    Asthma    Chronic diastolic CHF 05/2014   Echocardiogram 05/2019: EF 70, no RWMA, mild LVH, Gr 1 DD, normal RVSF, severe LVH, borderline asc Aorta (39 mm)   CKD (chronic kidney disease)    COPD (chronic obstructive pulmonary disease) (HCC) 2016   Depression    Diabetes mellitus without complication (HCC)    Diverticulitis    Dyspnea    with exertion   GERD (gastroesophageal reflux disease)    History of blood transfusion    Hyperlipidemia    cannot tolerate statins   Hypertension    patient states she has never had high blood pressure.    Myocardial infarction [facility]) adult   Nuclear stress test    Myoview  05/2019: EF 83, no ischemia or infarction; Low Risk   Oxygen  deficiency    Peripheral neuropathy    Pneumonia    PONV (postoperative nausea and vomiting)    RLS (restless legs syndrome)    Sleep apnea    uses CPAP   Ulcerative colitis (HCC)    dr kristie   Past Surgical History:  Past Surgical History:  Procedure Laterality  Date   ABDOMINAL HYSTERECTOMY     APPENDECTOMY     BIOPSY  10/10/2021   Procedure: BIOPSY;  Surgeon: Rollin Dover, MD;  Location: [facility];  Service: Gastroenterology;;   BREAST BIOPSY Left    BREAST SURGERY     left biopsy   CARDIAC CATHETERIZATION     CESAREAN SECTION     CHOLECYSTECTOMY     COLON SURGERY  2010   Sigmoid removed   COLONOSCOPY WITH PROPOFOL  N/A 10/10/2021   Procedure: COLONOSCOPY WITH PROPOFOL ;  Surgeon: Rollin Dover, MD;  Location: [facility];  Service: Gastroenterology;  Laterality: N/A;   HERNIA REPAIR     INSERTION OF MESH N/A 11/15/2015   Procedure: INSERTION OF MESH;  Surgeon: Sheldon Standing, MD;  Location: [facility];  Service: General;  Laterality: N/A;   LAPAROSCOPIC LYSIS OF ADHESIONS N/A 11/15/2015   Procedure: LAPAROSCOPIC LYSIS OF ADHESIONS;  Surgeon: Sheldon Standing, MD;  Location: [facility];  Service: General;  Laterality: N/A;   RIGHT HEART CATHETERIZATION N/A 02/27/2013   Procedure: RIGHT HEART CATH;  Surgeon: Ozell JONETTA Fell, MD;  Location: [facility] CATH LAB;  Service: Cardiovascular;  Laterality: N/A;   RIGHT HEART CATHETERIZATION N/A 12/14/2013   Procedure: RIGHT HEART CATH;  Surgeon: Ezra GORMAN Shuck, MD;  Location: [facility] CATH LAB;  Service: Cardiovascular;  Laterality: N/A;   SIGMOIDECTOMY  2010   diverticular disease   TUBAL LIGATION  1986   VENTRAL HERNIA REPAIR N/A 11/15/2015   Procedure: LAPAROSCOPIC VENTRAL WALL HERNIA REPAIR;  Surgeon: Sheldon Standing, MD;  Location: [facility];  Service: General;  Laterality: N/A;   Social History:  reports that she has never smoked. She has been exposed to tobacco smoke. She has never used smokeless tobacco. She reports that she does not drink alcohol and does not use drugs. Family History:  Family History  Problem Relation Age of Onset   Heart disease Mother    Hypertension Mother    Dementia Mother    Varicose Veins Mother    Heart disease Father    COPD Father    Hypertension Father    Hearing loss Father    Heart disease Brother    Other Brother        knee replacement; hip replacement   Other Brother        cant brathe when laying down   Diabetes Maternal Grandmother    Breast cancer Neg Hx    Colon cancer Neg Hx      HOME MEDICATIONS: Allergies as of 11/02/2023       Reactions   Almond Oil Anaphylaxis, Shortness Of Breath, Swelling   Morphine  And Codeine  Shortness Of Breath, Other (See Comments)   Pt. States while in the hospital it affected her breathing, O2 dropped to the 70's   Primidone  Shortness Of Breath       Atorvastatin Other (See Comments)   Leg weakness   Ceclor [cefaclor] Nausea And Vomiting   Latex  Rash   Material used for CPAP strap caused severe skin irritation.  Not sure if she is allergic to all latex   Sulfa  Antibiotics Nausea And Vomiting   Ciprofloxacin  Other (See Comments)   Makes joints and muscles ache   Levaquin  [levofloxacin ] Other (See Comments)   Body aches   Losartan  Other (See Comments)   Weakness   Peanut-containing Drug Products    Pt states she thinks it is causing gout flare-ups   Repatha  [evolocumab ]    Aches   Statins Other (See  Comments)   Leg and body weakness        Medication List        Accurate as of November 02, 2023 12:51 PM. If you have any questions, ask your nurse or doctor.          acetaminophen  500 MG tablet Commonly known as: TYLENOL  Take 1,000 mg by mouth every 8 (eight) hours as needed.   albuterol  108 (90 Base) MCG/ACT inhaler Commonly known as: VENTOLIN  HFA INHALE 2 PUFFS BY MOUTH TWICE A DAY TO 3 TIMES A DAY AS NEEDED FOR WHEEZE /SHORTNESS OF BREATH   arformoterol  15 MCG/2ML Nebu Commonly known as: BROVANA  Take 2 mLs (15 mcg total) by nebulization 2 (two) times daily.   budesonide  0.5 MG/2ML nebulizer solution Commonly known as: Pulmicort  Take 2 mLs (0.5 mg total) by nebulization 2 (two) times daily.   buPROPion  300 MG 24 hr tablet Commonly known as: WELLBUTRIN  XL Take 1 tablet (300 mg total) by mouth in the morning.   colchicine  0.6 MG tablet TAKE 1 TABLET (0.6 MG TOTAL) BY MOUTH DAILY AS NEEDED (FOR GOUT)   dicyclomine  20 MG tablet Commonly known as: BENTYL  Take 1 tablet (20 mg total) by mouth 4 (four) times daily as needed for GI cramping, pain, nausea/vomiting.   DULoxetine  60 MG capsule Commonly known as: CYMBALTA  Take 1 capsule (60 mg total) by mouth at bedtime.   furosemide  20 MG tablet Commonly known as: LASIX  Take 3 tablets (60 mg total) by mouth 2 (two) times daily.   gabapentin  300 MG capsule Commonly known as: NEURONTIN  TAKE 1 CAPSULE BY MOUTH TWICE A DAY   Global Ease Inject Pen Needles 32G  X 4 MM Misc Generic drug: Insulin  Pen Needle Use for insulin  in the morning, at noon, in the evening, and at bedtime.   Incruse Ellipta  62.5 MCG/ACT Aepb Generic drug: umeclidinium bromide  Inhale 1 puff into the lungs daily.   ipratropium-albuterol  0.5-2.5 (3) MG/3ML Soln Commonly known as: DUONEB Take 3 mLs by nebulization every 6 (six) hours as needed (wheezing).   ketoconazole  2 % cream Commonly known as: NIZORAL  Apply 1 Application topically 2 (two) times daily.   mesalamine  1.2 g EC tablet Commonly known as: LIALDA  Take 2 tablets (2.4 g total) by mouth 2 (two) times daily.   metolazone  5 MG tablet Commonly known as: ZAROXOLYN  Take 1 tablet (5 mg total) by mouth daily as needed.   Mounjaro  5 MG/0.5ML Pen Generic drug: tirzepatide  Inject 5 mg into the skin once a week.   NovoLOG  FlexPen 100 UNIT/ML FlexPen Generic drug: insulin  aspart Inject 16 Units into the skin 3 (three) times daily with meals.   ondansetron  4 MG tablet Commonly known as: ZOFRAN  Take 1 tablet (4 mg total) by mouth every 8 (eight) hours as needed for nausea and vomiting   optichamber diamond  Misc Use as dirtected with inhaler   OXYGEN  Inhale 2 L into the lungs continuous.   pantoprazole  40 MG tablet Commonly known as: PROTONIX  Take 1 tablet (40 mg total) by mouth every morning.   potassium chloride  10 MEQ CR capsule Commonly known as: MICRO-K  TAKE 2 CAPSULES ORALLY IN THE MORNING AND 1 CAPSULE DAILY AT 12 NOON AND 2 CAPSULES EVERY EVENING   Praluent  150 MG/ML Soaj Generic drug: Alirocumab  INJECT 1 ML (150MG ) INTO THE SKIN EVERY 14 DAYS   predniSONE  10 MG tablet Commonly known as: DELTASONE  Take 3 pills once daily by mouth for 3 days, then 2 pills once daily for 3  days, then 1 pill once daily for 3 days and then stop   rOPINIRole  4 MG tablet Commonly known as: REQUIP  TAKE 1 TABLET AT BEDTIME   spironolactone  25 MG tablet Commonly known as: ALDACTONE  Take 0.5 tablets (12.5 mg total)  by mouth daily.   traMADol  50 MG tablet Commonly known as: ULTRAM  Take 1 tablet (50 mg total) by mouth 3 (three) times daily as needed.   Tresiba  FlexTouch 200 UNIT/ML FlexTouch Pen Generic drug: insulin  degludec Inject 100 Units into the skin daily.   triamcinolone  cream 0.1 % Commonly known as: KENALOG  Apply 1 Application topically 2 (two) times daily as needed.   Vitamin D -3 125 MCG (5000 UT) Tabs Take 5,000 Units by mouth every other day.         OBJECTIVE:   Vital Signs: There were no vitals taken for this visit.  Wt Readings from Last 3 Encounters:  10/05/23 252 lb (114.3 kg)  09/16/23 252 lb (114.3 kg)  08/17/23 253 lb (114.8 kg)     Exam: General: Pt appears well and is in NAD In a wheel chair   Lungs: Clear with good BS bilat   Heart: RRR   Extremities: No pretibial edema.   Neuro: MS is good with appropriate affect, pt is alert and Ox3   DM Foot Exam 04/09/2023  The skin of the feet is intact without sores or ulcerations. The pedal pulses are 2+ on right and 2+ on left. The sensation is decreased to a screening 5.07, 10 gram monofilament bilaterally   DATA REVIEWED:  Lab Results  Component Value Date   HGBA1C 7.9 (A) 07/07/2023   HGBA1C 10.3 (H) 03/18/2023   HGBA1C 11.2 (H) 11/26/2022    Latest Reference Range & Units 09/16/23 15:26  Sodium 135 - 145 mEq/L 140  Potassium 3.5 - 5.1 mEq/L 3.7  Chloride 96 - 112 mEq/L 97  CO2 19 - 32 mEq/L 35 (H)  Glucose 70 - 99 mg/dL 842 (H)  BUN 6 - 23 mg/dL 17  Creatinine 9.59 - 8.79 mg/dL 8.82  Calcium  8.4 - 10.5 mg/dL 9.9  Alkaline Phosphatase 39 - 117 U/L 85  Albumin  3.5 - 5.2 g/dL 4.0  Uric Acid, Serum 2.4 - 7.0 mg/dL 8.1 (H)  AST 0 - 37 U/L 21  ALT 0 - 35 U/L 21  Total Protein 6.0 - 8.3 g/dL 6.4  Total Bilirubin 0.2 - 1.2 mg/dL 0.4  GFR >39.99 mL/min 47.89 (L)     Latest Reference Range & Units 09/28/23 10:39  Total CHOL/HDL Ratio  5  Cholesterol 0 - 200 mg/dL 804  HDL Cholesterol >60.99  mg/dL 59.19  Direct LDL mg/dL 19.9  NonHDL  845.84  Triglycerides 0.0 - 149.0 mg/dL 329.9 Triglyceride is over 400; calculations on Lipids are invalid. (H)  VLDL 0.0 - 40.0 mg/dL 865.9 (H)  (H): Data is abnormally high   ASSESSMENT / PLAN / RECOMMENDATIONS:   1) Type 2 Diabetes Mellitus,Sub-optimally  controlled, With CKD III, neuropathic  and macrovascular complications - Most recent A1c of 7.9 %. Goal A1c < 7.0 %.     -A1c is trending down  --She did not do well with a correction scale, so we discontinued it  -She is intolerant to Ozempic , tolerating Mounjaro  will increase -Unfortunately, she continues to use NovoLog  haphazardly, and most of the time after the meals, for example today she ate breakfast around 10:30 AM, but she only took her NovoLog  just before her visit at 2:20 pm.  I did  emphasize the importance of taking NovoLog  right before the meal or as soon as she starts eating -I have also advised the patient to separate Tresiba  from NovoLog  just so that she does not mix the insulin  accidentally - Not a candidate for SGLT-2i due to chronic UTI's  - Limited glycemic agents due to CKD  - She is unable to supplement urine for MA/CR ratio, she was given a cup to take home so she can submit in the future  MEDICATIONS: Increase Mounjaro   mg weekly Take tresiba  100 units daily  Novolog  16 units before breakfast and supper  EDUCATION / INSTRUCTIONS: BG monitoring instructions: Patient is instructed to check her blood sugars 3 times a day, before each meal . Call [facility] if: BG persistently < 70  I reviewed the Rule of 15 for the treatment of hypoglycemia in detail with the patient. Literature supplied.    2) Diabetic complications:  Eye: Does not have known diabetic retinopathy.  Neuro/ Feet: Does have known diabetic peripheral neuropathy .  Renal: Patient does  have known baseline CKD. She   is not on an ACEI/ARB at present. She had endorses weakness with  losartan        F/U in 4 months    Signed electronically by: Stefano Redgie Butts, MD  [facility] Endocrinology  [facility] [address] [place] [place], [place] [address] Phone: [phone] FAX: [phone]   CC: Cleatus Arlyss RAMAN, MD [address] [place] [address] Phone: [phone]  Fax: [phone]  Return to Endocrinology clinic as below: Future Appointments  Date Time Provider Department Center  11/02/2023  2:00 PM Lashana Spang, Donell Redgie, MD [facility] None  03/16/2024  2:30 PM Dina Camie BRAVO, PA-C [facility] None  07/17/2024 10:50 AM [facility] ANNUAL WELLNESS VISIT 1 [facility]

## 2023-11-02 NOTE — Telephone Encounter (Signed)
 Order has been faxed back on 10/27/23

## 2023-11-17 ENCOUNTER — Encounter: Payer: Self-pay | Admitting: Internal Medicine

## 2023-11-17 ENCOUNTER — Ambulatory Visit: Admitting: Internal Medicine

## 2023-11-17 VITALS — BP 132/84 | HR 94 | Ht 65.0 in | Wt 252.0 lb

## 2023-11-17 DIAGNOSIS — E1122 Type 2 diabetes mellitus with diabetic chronic kidney disease: Secondary | ICD-10-CM | POA: Diagnosis not present

## 2023-11-17 DIAGNOSIS — E1159 Type 2 diabetes mellitus with other circulatory complications: Secondary | ICD-10-CM

## 2023-11-17 DIAGNOSIS — N1831 Chronic kidney disease, stage 3a: Secondary | ICD-10-CM

## 2023-11-17 DIAGNOSIS — E1165 Type 2 diabetes mellitus with hyperglycemia: Secondary | ICD-10-CM | POA: Diagnosis not present

## 2023-11-17 DIAGNOSIS — E1142 Type 2 diabetes mellitus with diabetic polyneuropathy: Secondary | ICD-10-CM

## 2023-11-17 DIAGNOSIS — Z794 Long term (current) use of insulin: Secondary | ICD-10-CM

## 2023-11-17 LAB — POCT GLYCOSYLATED HEMOGLOBIN (HGB A1C): Hemoglobin A1C: 10.6 % — AB (ref 4.0–5.6)

## 2023-11-17 MED ORDER — TIRZEPATIDE 7.5 MG/0.5ML ~~LOC~~ SOAJ: 7.5000 mg | SUBCUTANEOUS | 3 refills | Status: DC

## 2023-11-17 NOTE — Patient Instructions (Signed)
 Increase  Mounjaro  7.5 mg weekly  Take Tresiba  100 units DAILY  Novolog  16 units with breakfast and Supper   HOW TO TREAT LOW BLOOD SUGARS (Blood sugar LESS THAN 70 MG/DL) Please follow the RULE OF 15 for the treatment of hypoglycemia treatment (when your (blood sugars are less than 70 mg/dL)   STEP 1: Take 15 grams of carbohydrates when your blood sugar is low, which includes:  3-4 GLUCOSE TABS  OR 3-4 OZ OF JUICE OR REGULAR SODA OR ONE TUBE OF GLUCOSE GEL    STEP 2: RECHECK blood sugar in 15 MINUTES STEP 3: If your blood sugar is still low at the 15 minute recheck --> then, go back to STEP 1 and treat AGAIN with another 15 grams of carbohydrates.

## 2023-11-17 NOTE — Progress Notes (Signed)
 Name: Kathleen Hatfield  Age/ Sex: 68 y.o., female   MRN/ DOB: [id number], 1955-12-31     PCP: Cleatus Arlyss RAMAN, MD   Reason for Endocrinology Evaluation: Type 2 Diabetes Mellitus  Initial Endocrine Consultative Visit: 05/14/2021    PATIENT IDENTIFIER: Kathleen Hatfield is a 68 y.o. female with a past medical history of DM, CHF, COPS. OSA on CPAP, GERD. The patient has followed with Endocrinology clinic since 05/14/2021 for consultative assistance with management of her diabetes.  DIABETIC HISTORY:  Kathleen Hatfield was diagnosed with DM 2017, and started insulin  therapyin 2000. Her hemoglobin A1c has ranged from 7.4% in 2017, peaking at 11.5% in 2022.   She was seen by Dr. Kassie once 05/2021  Ozempic  discontinued by PCP due to nausea and abdominal 02/2023  SUBJECTIVE:   During the last visit (07/07/2023): A1c 7.9%     Today (11/17/2023): Kathleen Hatfield is here for a follow up on diabetes management..  She checks her  glucose through dexcom G7.  She has no hypoglycemia.  She has chronic UTIs- sees urology on chronic Abx  Patient follows with pulmonary for COPD and OSA on CPAP She was recently evaluated by neurology for tremors, certain glove was recommended as well as weighted utensils  Patient did receive prednisone  taper in July for wrist pain No nausea or vomiting  Has alternating constipation with diarrhea    HOME DIABETES REGIMEN:  Mounjaro  5 mg weekly  Tresiba  100 units at bedtime  Novolog  16 units TIDQAC    Statin: on repatha  ACE-I/ARB: no   CONTINUOUS GLUCOSE MONITORING RECORD INTERPRETATION    Dates of Recording: 8/28-9/12/2023  Sensor description:dexcom  Results statistics:   CGM use % of time 83  Average and SD 306/57  Time in range 0%  % Time Above 180 18  % Time above 250 82  % Time Below target 0   Glycemic patterns summary: BGs are high throughout the day and night  Hyperglycemic episodes all day and night  Hypoglycemic episodes occurred N/A  Overnight  periods: High     DIABETIC COMPLICATIONS: Microvascular complications:  Neuropathy, CKD III Denies:  Last Eye Exam: Completed 10/01/2022  Macrovascular complications:  CAD Denies:  CVA, PVD   HISTORY:  Past Medical History:  Past Medical History:  Diagnosis Date   Allergy  Childhood   almonds - anaphylactic shock   Anemia    Anxiety    Arthritis    Asthma    Chronic diastolic CHF 05/2014   Echocardiogram 05/2019: EF 70, no RWMA, mild LVH, Gr 1 DD, normal RVSF, severe LVH, borderline asc Aorta (39 mm)   CKD (chronic kidney disease)    COPD (chronic obstructive pulmonary disease) (HCC) 2016   Depression    Diabetes mellitus without complication (HCC)    Diverticulitis    Dyspnea    with exertion   GERD (gastroesophageal reflux disease)    History of blood transfusion    Hyperlipidemia    cannot tolerate statins   Hypertension    patient states she has never had high blood pressure.    Myocardial infarction [facility]) adult   Nuclear stress test    Myoview  05/2019: EF 83, no ischemia or infarction; Low Risk   Oxygen  deficiency    Peripheral neuropathy    Pneumonia    PONV (postoperative nausea and vomiting)    RLS (restless legs syndrome)    Sleep apnea    uses CPAP   Ulcerative colitis (HCC)    dr kristie  Past Surgical History:  Past Surgical History:  Procedure Laterality Date   ABDOMINAL HYSTERECTOMY     APPENDECTOMY     BIOPSY  10/10/2021   Procedure: BIOPSY;  Surgeon: Rollin Dover, MD;  Location: [facility];  Service: Gastroenterology;;   BREAST BIOPSY Left    BREAST SURGERY     left biopsy   CARDIAC CATHETERIZATION     CESAREAN SECTION     CHOLECYSTECTOMY     COLON SURGERY  2010   Sigmoid removed   COLONOSCOPY WITH PROPOFOL  N/A 10/10/2021   Procedure: COLONOSCOPY WITH PROPOFOL ;  Surgeon: Rollin Dover, MD;  Location: [facility];  Service: Gastroenterology;  Laterality: N/A;   HERNIA REPAIR     INSERTION OF MESH N/A 11/15/2015   Procedure:  INSERTION OF MESH;  Surgeon: Sheldon Standing, MD;  Location: [facility];  Service: General;  Laterality: N/A;   LAPAROSCOPIC LYSIS OF ADHESIONS N/A 11/15/2015   Procedure: LAPAROSCOPIC LYSIS OF ADHESIONS;  Surgeon: Sheldon Standing, MD;  Location: [facility];  Service: General;  Laterality: N/A;   RIGHT HEART CATHETERIZATION N/A 02/27/2013   Procedure: RIGHT HEART CATH;  Surgeon: Ozell JONETTA Fell, MD;  Location[facility] LAB;  Service: Cardiovascular;  Laterality: N/A;   RIGHT HEART CATHETERIZATION N/A 12/14/2013   Procedure: RIGHT HEART CATH;  Surgeon: Ezra GORMAN Shuck, MD;  Location: [facility] CATH LAB;  Service: Cardiovascular;  Laterality: N/A;   SIGMOIDECTOMY  2010   diverticular disease   TUBAL LIGATION  1986   VENTRAL HERNIA REPAIR N/A 11/15/2015   Procedure: LAPAROSCOPIC VENTRAL WALL HERNIA REPAIR;  Surgeon: Sheldon Standing, MD;  Location: [facility];  Service: General;  Laterality: N/A;   Social History:  reports that she has never smoked. She has been exposed to tobacco smoke. She has never used smokeless tobacco. She reports that she does not drink alcohol and does not use drugs. Family History:  Family History  Problem Relation Age of Onset   Heart disease Mother    Hypertension Mother    Dementia Mother    Varicose Veins Mother    Heart disease Father    COPD Father    Hypertension Father    Hearing loss Father    Heart disease Brother    Other Brother        knee replacement; hip replacement   Other Brother        cant brathe when laying down   Diabetes Maternal Grandmother    Breast cancer Neg Hx    Colon cancer Neg Hx      HOME MEDICATIONS: Allergies as of 11/17/2023       Reactions   Almond Oil Anaphylaxis, Shortness Of Breath, Swelling   Morphine  And Codeine  Shortness Of Breath, Other (See Comments)   Pt. States while in the hospital it affected her breathing, O2 dropped to the 70's   Primidone  Shortness Of Breath       Atorvastatin Other (See Comments)   Leg weakness   Ceclor  [cefaclor] Nausea And Vomiting   Latex Rash   Material used for CPAP strap caused severe skin irritation.  Not sure if she is allergic to all latex   Sulfa  Antibiotics Nausea And Vomiting   Ciprofloxacin  Other (See Comments)   Makes joints and muscles ache   Levaquin  [levofloxacin ] Other (See Comments)   Body aches   Losartan  Other (See Comments)   Weakness   Peanut-containing Drug Products    Pt states she thinks it is causing gout flare-ups   Repatha  [  evolocumab ]    Aches   Statins Other (See Comments)   Leg and body weakness        Medication List        Accurate as of November 17, 2023 12:59 PM. If you have any questions, ask your nurse or doctor.          acetaminophen  500 MG tablet Commonly known as: TYLENOL  Take 1,000 mg by mouth every 8 (eight) hours as needed.   albuterol  108 (90 Base) MCG/ACT inhaler Commonly known as: VENTOLIN  HFA INHALE 2 PUFFS BY MOUTH TWICE A DAY TO 3 TIMES A DAY AS NEEDED FOR WHEEZE /SHORTNESS OF BREATH   arformoterol  15 MCG/2ML Nebu Commonly known as: BROVANA  Take 2 mLs (15 mcg total) by nebulization 2 (two) times daily.   budesonide  0.5 MG/2ML nebulizer solution Commonly known as: Pulmicort  Take 2 mLs (0.5 mg total) by nebulization 2 (two) times daily.   buPROPion  300 MG 24 hr tablet Commonly known as: WELLBUTRIN  XL Take 1 tablet (300 mg total) by mouth in the morning.   colchicine  0.6 MG tablet TAKE 1 TABLET (0.6 MG TOTAL) BY MOUTH DAILY AS NEEDED (FOR GOUT)   dicyclomine  20 MG tablet Commonly known as: BENTYL  Take 1 tablet (20 mg total) by mouth 4 (four) times daily as needed for GI cramping, pain, nausea/vomiting.   DULoxetine  60 MG capsule Commonly known as: CYMBALTA  Take 1 capsule (60 mg total) by mouth at bedtime.   furosemide  20 MG tablet Commonly known as: LASIX  Take 3 tablets (60 mg total) by mouth 2 (two) times daily.   gabapentin  300 MG capsule Commonly known as: NEURONTIN  TAKE 1 CAPSULE BY MOUTH TWICE A  DAY   Global Ease Inject Pen Needles 32G X 4 MM Misc Generic drug: Insulin  Pen Needle Use for insulin  in the morning, at noon, in the evening, and at bedtime.   Incruse Ellipta  62.5 MCG/ACT Aepb Generic drug: umeclidinium bromide  Inhale 1 puff into the lungs daily.   ipratropium-albuterol  0.5-2.5 (3) MG/3ML Soln Commonly known as: DUONEB Take 3 mLs by nebulization every 6 (six) hours as needed (wheezing).   ketoconazole  2 % cream Commonly known as: NIZORAL  Apply 1 Application topically 2 (two) times daily.   mesalamine  1.2 g EC tablet Commonly known as: LIALDA  Take 2 tablets (2.4 g total) by mouth 2 (two) times daily.   metolazone  5 MG tablet Commonly known as: ZAROXOLYN  Take 1 tablet (5 mg total) by mouth daily as needed.   Mounjaro  5 MG/0.5ML Pen Generic drug: tirzepatide  Inject 5 mg into the skin once a week.   NovoLOG  FlexPen 100 UNIT/ML FlexPen Generic drug: insulin  aspart Inject 16 Units into the skin 3 (three) times daily with meals.   ondansetron  4 MG tablet Commonly known as: ZOFRAN  Take 1 tablet (4 mg total) by mouth every 8 (eight) hours as needed for nausea and vomiting   optichamber diamond  Misc Use as dirtected with inhaler   OXYGEN  Inhale 2 L into the lungs continuous.   pantoprazole  40 MG tablet Commonly known as: PROTONIX  Take 1 tablet (40 mg total) by mouth every morning.   potassium chloride  10 MEQ CR capsule Commonly known as: MICRO-K  TAKE 2 CAPSULES ORALLY IN THE MORNING AND 1 CAPSULE DAILY AT 12 NOON AND 2 CAPSULES EVERY EVENING   Praluent  150 MG/ML Soaj Generic drug: Alirocumab  INJECT 1 ML (150MG ) INTO THE SKIN EVERY 14 DAYS   predniSONE  10 MG tablet Commonly known as: DELTASONE  Take 3 pills once daily by mouth  for 3 days, then 2 pills once daily for 3 days, then 1 pill once daily for 3 days and then stop   rOPINIRole  4 MG tablet Commonly known as: REQUIP  TAKE 1 TABLET AT BEDTIME   spironolactone  25 MG tablet Commonly known as:  ALDACTONE  Take 0.5 tablets (12.5 mg total) by mouth daily.   traMADol  50 MG tablet Commonly known as: ULTRAM  Take 1 tablet (50 mg total) by mouth 3 (three) times daily as needed.   Tresiba  FlexTouch 200 UNIT/ML FlexTouch Pen Generic drug: insulin  degludec Inject 100 Units into the skin daily.   triamcinolone  cream 0.1 % Commonly known as: KENALOG  Apply 1 Application topically 2 (two) times daily as needed.   Vitamin D -3 125 MCG (5000 UT) Tabs Take 5,000 Units by mouth every other day.         OBJECTIVE:   Vital Signs: BP 132/84 (BP Location: Left Arm, Patient Position: Sitting, Cuff Size: Normal)   Pulse 94   Ht 5' 5 (1.651 m)   Wt 252 lb (114.3 kg)   SpO2 96%   BMI 41.93 kg/m   Wt Readings from Last 3 Encounters:  11/17/23 252 lb (114.3 kg)  10/05/23 252 lb (114.3 kg)  09/16/23 252 lb (114.3 kg)     Exam: General: Pt appears well and is in NAD In a wheel chair   Lungs: Clear with good BS bilat   Heart: RRR   Extremities: No pretibial edema.   Neuro: MS is good with appropriate affect, pt is alert and Ox3   DM Foot Exam 04/09/2023  The skin of the feet is intact without sores or ulcerations. The pedal pulses are 2+ on right and 2+ on left. The sensation is decreased to a screening 5.07, 10 gram monofilament bilaterally   DATA REVIEWED:  Lab Results  Component Value Date   HGBA1C 10.6 (A) 11/17/2023   HGBA1C 7.9 (A) 07/07/2023   HGBA1C 10.3 (H) 03/18/2023    Latest Reference Range & Units 09/16/23 15:26  Sodium 135 - 145 mEq/L 140  Potassium 3.5 - 5.1 mEq/L 3.7  Chloride 96 - 112 mEq/L 97  CO2 19 - 32 mEq/L 35 (H)  Glucose 70 - 99 mg/dL 842 (H)  BUN 6 - 23 mg/dL 17  Creatinine 9.59 - 8.79 mg/dL 8.82  Calcium  8.4 - 10.5 mg/dL 9.9  Alkaline Phosphatase 39 - 117 U/L 85  Albumin  3.5 - 5.2 g/dL 4.0  Uric Acid, Serum 2.4 - 7.0 mg/dL 8.1 (H)  AST 0 - 37 U/L 21  ALT 0 - 35 U/L 21  Total Protein 6.0 - 8.3 g/dL 6.4  Total Bilirubin 0.2 - 1.2 mg/dL 0.4   GFR >39.99 mL/min 47.89 (L)     Latest Reference Range & Units 09/28/23 10:39  Total CHOL/HDL Ratio  5  Cholesterol 0 - 200 mg/dL 804  HDL Cholesterol >60.99 mg/dL 59.19  Direct LDL mg/dL 19.9  NonHDL  845.84  Triglycerides 0.0 - 149.0 mg/dL 329.9 Triglyceride is over 400; calculations on Lipids are invalid. (H)  VLDL 0.0 - 40.0 mg/dL 865.9 (H)  (H): Data is abnormally high   ASSESSMENT / PLAN / RECOMMENDATIONS:   1) Type 2 Diabetes Mellitus, poorly controlled, With CKD III, neuropathic  and macrovascular complications - Most recent A1c of 10.5 %. Goal A1c < 7.0 %.     -A1c has increased from 7.9% to 10.6%, due to medication nonadherence as she is not always consistent with Tresiba  and NovoLog  intake -We did discuss insulin   pump technology, I did show her the OmniPod, I also explained to the patient for the OmniPod to work efficiently she will need to use the Dexcom app rather than a receiver. -The patient does not use her phone, because for decades she had to work with phones and pages and has been avoiding using the phone.  I did encourage the patient to consider that technology, not only to be able to use the pump but also to set up reminders on the phone to help her remember to take her medications --She did not do well with a correction scale, so we discontinued it  - She is intolerant to Ozempic  - Not a candidate for SGLT-2i due to chronic UTI's  - Limited glycemic agents due to CKD  - I will increase Mounjaro  - No changes to her insulin  at this time   MEDICATIONS: Increase Mounjaro  7.5 mg weekly Take tresiba  100 units daily  Novolog  16 units before breakfast and supper  EDUCATION / INSTRUCTIONS: BG monitoring instructions: Patient is instructed to check her blood sugars 3 times a day, before each meal . Call [facility] if: BG persistently < 70  I reviewed the Rule of 15 for the treatment of hypoglycemia in detail with the patient. Literature  supplied.    2) Diabetic complications:  Eye: Does not have known diabetic retinopathy.  Neuro/ Feet: Does have known diabetic peripheral neuropathy .  Renal: Patient does  have known baseline CKD. She   is not on an ACEI/ARB at present. She had endorses weakness with losartan        F/U in 4 months    Signed electronically by: Stefano Redgie Butts, MD  [facility] End[facility]  [facility] [address] [place] [place], [place] [address] Phone: [phone] FAX: [phone]   CC: Cleatus Arlyss RAMAN, MD [address] [place] [address] Phone: [phone]  Fax: [phone]  Return to Endocrinology clinic as below: Future Appointments  Date Time Provider Department Center  11/17/2023  1:00 PM Jalessa Peyser, Donell Redgie, MD [facility] None  03/16/2024  2:30 PM Dina Camie BRAVO, PA-C [facility] None  07/17/2024 10:50 AM [facility] ANNUAL WELLNESS VISIT 1 [facility] [address]

## 2023-11-20 DIAGNOSIS — G4733 Obstructive sleep apnea (adult) (pediatric): Secondary | ICD-10-CM | POA: Diagnosis not present

## 2023-11-20 DIAGNOSIS — I10 Essential (primary) hypertension: Secondary | ICD-10-CM | POA: Diagnosis not present

## 2023-11-20 DIAGNOSIS — J449 Chronic obstructive pulmonary disease, unspecified: Secondary | ICD-10-CM | POA: Diagnosis not present

## 2023-11-23 ENCOUNTER — Ambulatory Visit
Admission: RE | Admit: 2023-11-23 | Discharge: 2023-11-23 | Disposition: A | Discharge status: Home / Self Care | Source: Ambulatory Visit | Attending: Family Medicine | Admitting: Family Medicine

## 2023-11-23 DIAGNOSIS — I700 Atherosclerosis of aorta: Secondary | ICD-10-CM | POA: Diagnosis not present

## 2023-11-23 DIAGNOSIS — R918 Other nonspecific abnormal finding of lung field: Secondary | ICD-10-CM | POA: Diagnosis not present

## 2023-11-23 DIAGNOSIS — I77819 Aortic ectasia, unspecified site: Secondary | ICD-10-CM | POA: Diagnosis not present

## 2023-11-25 DIAGNOSIS — E1165 Type 2 diabetes mellitus with hyperglycemia: Secondary | ICD-10-CM | POA: Diagnosis not present

## 2023-11-28 ENCOUNTER — Ambulatory Visit: Payer: Self-pay | Admitting: Family Medicine

## 2023-12-02 ENCOUNTER — Other Ambulatory Visit: Payer: Self-pay | Admitting: Family Medicine

## 2023-12-03 ENCOUNTER — Ambulatory Visit: Payer: Self-pay

## 2023-12-03 NOTE — Telephone Encounter (Signed)
 FYI Only or Action Required?: FYI only for provider.  Patient was last seen in primary care on 10/14/2023 by Watt Mirza, MD.  Called Nurse Triage reporting Fatigue/Body Aches.  Symptoms began several weeks ago.  Interventions attempted: OTC medications: Tylenol .  Symptoms are: gradually worsening.  Triage Disposition: See PCP When Office is Open (Within 3 Days)  Patient/caregiver understands and will follow disposition?: Yes     Copied from CRM #[id number]. Topic: Clinical - Red Word Triage >> Dec 03, 2023  4:28 PM [place] J wrote: [facility] that prompted transfer to Nurse Triage: Patient has been feeling very lethargic and excessively fatigued. She is having a hard time staying awake and a hard time staying asleep. She is also having body aches. Reason for Disposition  [1] MODERATE pain (e.g., interferes with normal activities) AND [2] present > 3 days  Answer Assessment - Initial Assessment Questions Taking tylenol  for symptoms. Denies chest pain. States she always has SOB no more than usual. Patient states she has had dizziness while sitting down for the last couple of days. Patient states she has felt really thirsty in the last few days. She states her blood sugar has been a little elevated.     1. ONSET: When did the muscle aches or body pains start?      She states she has this pain all the time but has worsened in the last few weeks  2. LOCATION: What part of your body is hurting? (e.g., entire body, arms, legs)      All over mostly in legs  3. SEVERITY: How bad is the pain? (Scale 1-10; or mild, moderate, severe)     8-10 out of 10  4. FEVER: Do you have a fever? If Yes, ask: What is your temperature, how was it measured, and  when did it start?      Feeling hot in her head, neck and body but unsure if she has a fever  6. OTHER SYMPTOMS: Do you have any other symptoms? (e.g., chest pain, cold or flu symptoms, rash, weakness, weight loss)     Cough, weakness  felling fatigue  Protocols used: Muscle Aches and Body Pain-A-AH

## 2023-12-03 NOTE — Telephone Encounter (Signed)
 Appointment scheduled with Dr. Cleatus 12/06/23 at 3:00 pm.

## 2023-12-04 ENCOUNTER — Other Ambulatory Visit: Payer: Self-pay | Admitting: Nurse Practitioner

## 2023-12-04 DIAGNOSIS — J4489 Other specified chronic obstructive pulmonary disease: Secondary | ICD-10-CM

## 2023-12-05 NOTE — Telephone Encounter (Signed)
Noted. Thanks. Will see at OV.  

## 2023-12-06 ENCOUNTER — Ambulatory Visit (INDEPENDENT_AMBULATORY_CARE_PROVIDER_SITE_OTHER): Admitting: Family Medicine

## 2023-12-06 ENCOUNTER — Encounter: Payer: Self-pay | Admitting: Family Medicine

## 2023-12-06 VITALS — BP 138/78 | HR 86 | Temp 98.5°F | Ht 65.0 in

## 2023-12-06 DIAGNOSIS — M791 Myalgia, unspecified site: Secondary | ICD-10-CM | POA: Diagnosis not present

## 2023-12-06 DIAGNOSIS — R5383 Other fatigue: Secondary | ICD-10-CM

## 2023-12-06 MED ORDER — NOVOLOG FLEXPEN 100 UNIT/ML ~~LOC~~ SOPN: 16.0000 [IU] | PEN_INJECTOR | Freq: Two times a day (BID) | SUBCUTANEOUS | Status: DC

## 2023-12-06 MED ORDER — VITAMIN B-12 1000 MCG PO TABS: 1000.0000 ug | ORAL_TABLET | Freq: Every day | ORAL | Status: AC

## 2023-12-06 MED ORDER — ROPINIROLE HCL 4 MG PO TABS: 4.0000 mg | ORAL_TABLET | Freq: Every day | ORAL | Status: DC

## 2023-12-06 MED ORDER — PRALUENT 150 MG/ML ~~LOC~~ SOAJ: SUBCUTANEOUS | Status: DC

## 2023-12-06 NOTE — Patient Instructions (Addendum)
 Go to the lab on the way out.   If you have mychart we'll likely use that to update you.    Take care.  Glad to see you. I would hold praluent .  Start taking 1000mcg B12 per day.

## 2023-12-06 NOTE — Progress Notes (Unsigned)
 Fatigue. No correlation with praluent  use.  Prev TSH wnl.  Now normal B12.  HGB not low.  Lower motivation.  Fatigue worse in the last 2 weeks, gradually worse in the last few months.    Taking requip  2mg  midday and 4mg  qPM.  That helps with leg aches.    Diffuse aches.  No correlation to praluent  dosing. Some better the last few days.  Aches going on (off and on) for the last year or so.  It was clearly worse a few weeks ago.  Tylenol /meloxicam /tramadol  didn't help.  Diffuse pain, arms/legs/trunk.  No fevers known.  No known tick bites.    She has B medial knee pain, just distal to the joint line.   Meds, vitals, and allergies reviewed.   ROS: Per HPI unless specifically indicated in ROS section   Hold praluent   Start B12.

## 2023-12-07 LAB — CBC WITH DIFFERENTIAL/PLATELET
Basophils Absolute: 0 K/uL (ref 0.0–0.1)
Basophils Relative: 0.5 % (ref 0.0–3.0)
Eosinophils Absolute: 0.1 K/uL (ref 0.0–0.7)
Eosinophils Relative: 1.7 % (ref 0.0–5.0)
HCT: 41.4 % (ref 36.0–46.0)
Hemoglobin: 13.7 g/dL (ref 12.0–15.0)
Lymphocytes Relative: 44.2 % (ref 12.0–46.0)
Lymphs Abs: 3.3 K/uL (ref 0.7–4.0)
MCHC: 33.1 g/dL (ref 30.0–36.0)
MCV: 91.3 fl (ref 78.0–100.0)
Monocytes Absolute: 0.6 K/uL (ref 0.1–1.0)
Monocytes Relative: 8.6 % (ref 3.0–12.0)
Neutro Abs: 3.3 K/uL (ref 1.4–7.7)
Neutrophils Relative %: 45 % (ref 43.0–77.0)
Platelets: 262 K/uL (ref 150.0–400.0)
RBC: 4.53 Mil/uL (ref 3.87–5.11)
RDW: 14.5 % (ref 11.5–15.5)
WBC: 7.4 K/uL (ref 4.0–10.5)

## 2023-12-07 LAB — COMPREHENSIVE METABOLIC PANEL WITH GFR
ALT: 34 U/L (ref 0–35)
AST: 33 U/L (ref 0–37)
Albumin: 3.7 g/dL (ref 3.5–5.2)
Alkaline Phosphatase: 93 U/L (ref 39–117)
BUN: 21 mg/dL (ref 6–23)
CO2: 38 meq/L — ABNORMAL HIGH (ref 19–32)
Calcium: 9.9 mg/dL (ref 8.4–10.5)
Chloride: 97 meq/L (ref 96–112)
Creatinine, Ser: 1.17 mg/dL (ref 0.40–1.20)
GFR: 47.82 mL/min — ABNORMAL LOW (ref >60.00)
Glucose, Bld: 226 mg/dL — ABNORMAL HIGH (ref 70–99)
Potassium: 4.1 meq/L (ref 3.5–5.1)
Sodium: 141 meq/L (ref 135–145)
Total Bilirubin: 0.4 mg/dL (ref 0.2–1.2)
Total Protein: 6.2 g/dL (ref 6.0–8.3)

## 2023-12-07 LAB — VITAMIN B12: Vitamin B-12: 243 pg/mL (ref 211–911)

## 2023-12-07 LAB — MAGNESIUM: Magnesium: 1.8 mg/dL (ref 1.5–2.5)

## 2023-12-07 LAB — CK: Total CK: 91 U/L (ref 17–177)

## 2023-12-07 LAB — TSH: TSH: 2.55 u[IU]/mL (ref 0.35–5.50)

## 2023-12-09 ENCOUNTER — Ambulatory Visit: Payer: Self-pay | Admitting: Family Medicine

## 2023-12-09 NOTE — Assessment & Plan Note (Signed)
 Of unclear source, possibly multifactorial.  Unclear if this is related to Praluent .  Would hold Praluent  in the meantime.  History of low normal B12.  Start oral B12 replacement.  See after visit summary. Check labs today related to fatigue and muscle pain.  At this point still okay for outpatient follow-up.  She agrees with plan. 31 minutes were devoted to patient care in this encounter (this includes time spent reviewing the patient's file/history, interviewing and examining the patient, counseling/reviewing plan with patient).

## 2023-12-13 ENCOUNTER — Telehealth: Payer: Self-pay | Admitting: Cardiovascular Disease

## 2023-12-13 MED ORDER — FUROSEMIDE 20 MG PO TABS: 60.0000 mg | ORAL_TABLET | Freq: Two times a day (BID) | ORAL | 1 refills | Status: DC

## 2023-12-13 NOTE — Telephone Encounter (Signed)
 Pt's medication was sent to pt's pharmacy as requested. Confirmation received.

## 2023-12-13 NOTE — Telephone Encounter (Signed)
*  STAT* If patient is at the pharmacy, call can be transferred to refill team.   1. Which medications need to be refilled? (please list name of each medication and dose if known) furosemide  (LASIX ) 20 MG tablet   2. Which pharmacy/location (including street and city if local pharmacy) is medication to be sent to? [id number] - [place],  - [address]   3. Do they need a 30 day or 90 day supply? 90   Patient has appt 11/18

## 2023-12-20 DIAGNOSIS — I10 Essential (primary) hypertension: Secondary | ICD-10-CM | POA: Diagnosis not present

## 2023-12-20 DIAGNOSIS — J449 Chronic obstructive pulmonary disease, unspecified: Secondary | ICD-10-CM | POA: Diagnosis not present

## 2023-12-25 ENCOUNTER — Other Ambulatory Visit: Payer: Self-pay | Admitting: Family Medicine

## 2023-12-25 DIAGNOSIS — M109 Gout, unspecified: Secondary | ICD-10-CM

## 2024-01-07 ENCOUNTER — Other Ambulatory Visit: Payer: Self-pay | Admitting: Family Medicine

## 2024-01-07 DIAGNOSIS — E876 Hypokalemia: Secondary | ICD-10-CM

## 2024-01-10 ENCOUNTER — Telehealth: Payer: Self-pay

## 2024-01-10 NOTE — Telephone Encounter (Signed)
 Please send the note from 11/17/23 from endo that states she is using a dexcom.  Thanks.

## 2024-01-11 NOTE — Telephone Encounter (Signed)
 We can't send notes from another office can we?

## 2024-01-18 NOTE — Telephone Encounter (Signed)
 Faxed as requested

## 2024-01-20 ENCOUNTER — Telehealth (HOSPITAL_BASED_OUTPATIENT_CLINIC_OR_DEPARTMENT_OTHER): Payer: Self-pay

## 2024-01-20 DIAGNOSIS — I10 Essential (primary) hypertension: Secondary | ICD-10-CM | POA: Diagnosis not present

## 2024-01-20 DIAGNOSIS — G4733 Obstructive sleep apnea (adult) (pediatric): Secondary | ICD-10-CM | POA: Diagnosis not present

## 2024-01-20 NOTE — Telephone Encounter (Signed)
 CMN received for oxygen  signed by provider and faxed confirmation received sent to [facility]

## 2024-01-25 ENCOUNTER — Encounter: Payer: Self-pay | Admitting: Cardiovascular Disease

## 2024-01-25 ENCOUNTER — Ambulatory Visit: Attending: Cardiovascular Disease | Admitting: Cardiovascular Disease

## 2024-01-25 VITALS — BP 148/79 | HR 98 | Resp 16 | Ht 65.0 in | Wt 245.0 lb

## 2024-01-25 DIAGNOSIS — E1165 Type 2 diabetes mellitus with hyperglycemia: Secondary | ICD-10-CM | POA: Diagnosis not present

## 2024-01-25 DIAGNOSIS — N1831 Chronic kidney disease, stage 3a: Secondary | ICD-10-CM | POA: Diagnosis not present

## 2024-01-25 DIAGNOSIS — R0789 Other chest pain: Secondary | ICD-10-CM

## 2024-01-25 DIAGNOSIS — I700 Atherosclerosis of aorta: Secondary | ICD-10-CM

## 2024-01-25 DIAGNOSIS — I10 Essential (primary) hypertension: Secondary | ICD-10-CM

## 2024-01-25 DIAGNOSIS — I5032 Chronic diastolic (congestive) heart failure: Secondary | ICD-10-CM | POA: Diagnosis not present

## 2024-01-25 NOTE — Assessment & Plan Note (Signed)
 intolerant to statins and PCSK9 inhibitors.

## 2024-01-25 NOTE — Assessment & Plan Note (Signed)
 Continue current management.  States home readings are lower than her office reading today.

## 2024-01-25 NOTE — Assessment & Plan Note (Signed)
 Management per PCP.  Seems to be improving.  Discussed the idea of an SGLT2, but she has had no problems with urinary tract infections and with her hyperglycemia and elevated hemoglobin A1c greater than 10, I think the risk outweighs the benefit.

## 2024-01-25 NOTE — Progress Notes (Signed)
 Cardiology Office Note:    Date:  01/25/2024   ID:  FAIGA STONES, DOB [birth year], MRN [id number]  PCP:  Cleatus Arlyss RAMAN, MD    [facility] Providers Cardiologist:  Ozell Fell, MD Cardiology APP:  Lelon Glendia ONEIDA DEVONNA     Referring MD: Cleatus Arlyss RAMAN, MD   Chief Complaint  Patient presents with   Chronic heart failure with preserved ejection fraction    Follow-up    1 year    History of Present Illness:    SAM WUNSCHEL is a 68 y.o. female with a hx of:  Chronic diastolic CHF Echocardiogram 05/2019: EF 70, mild LVH, Gr 1 DD, severe RVH CMR 10/21: EF 78, normal RVSF, NO RVH seen, thick epicardial adipose tissue noted, nonspecific mid wall LGE at RV insertion site Myoview  05/2019: no ischemia Aortic atherosclerosis  Asthma/COPD Obstructive sleep apnea Morbid obesity Chronic kidney disease 2-3 Hypertension Diabetes mellitus Hyperlipidemia Intolerant of statins GERD Diverticulitis s/p partial colectomy  The patient complains of a sensation of pressure in her chest for the past few days intermittently.  She states it feels like she may have fluid but not sure about this.  She denies orthopnea, PND, or leg swelling at present.  She has been taking metolazone  twice per week and has been consistent with her dosing of furosemide  twice daily.  She is in a wheelchair today and she remains very sedentary.  She has lost a little over 10 pounds in the past year on Mounjaro .  States her blood glucose control is improving.  Current Medications: Current Meds  Medication Sig   acetaminophen  (TYLENOL ) 500 MG tablet Take 1,000 mg by mouth every 8 (eight) hours as needed.   albuterol  (VENTOLIN  HFA) 108 (90 Base) MCG/ACT inhaler INHALE 2 PUFFS BY MOUTH TWICE A DAY TO 3 TIMES A DAY AS NEEDED FOR WHEEZE /SHORTNESS OF BREATH   arformoterol  (BROVANA ) 15 MCG/2ML NEBU Substituted for: Brovana  Neb Solution Inhale one vial in nebulizer twice a day.   budesonide  (PULMICORT ) 0.5 MG/2ML  nebulizer solution Generic for Pulmicort . Inhale one vial in nebulizer twice a day. Rinse mouth after use.   buPROPion  (WELLBUTRIN  XL) 300 MG 24 hr tablet Take 1 tablet (300 mg total) by mouth in the morning.   Cholecalciferol  (VITAMIN D -3) 5000 UNITS TABS Take 5,000 Units by mouth every other day.    colchicine  0.6 MG tablet TAKE 1 TABLET (0.6 MG TOTAL) BY MOUTH DAILY AS NEEDED (FOR GOUT)   cyanocobalamin  (VITAMIN B12) 1000 MCG tablet Take 1 tablet (1,000 mcg total) by mouth daily.   dicyclomine  (BENTYL ) 20 MG tablet Take 1 tablet (20 mg total) by mouth 4 (four) times daily as needed for GI cramping, pain, nausea/vomiting.   DULoxetine  (CYMBALTA ) 60 MG capsule Take 1 capsule (60 mg total) by mouth at bedtime.   furosemide  (LASIX ) 20 MG tablet Take 3 tablets (60 mg total) by mouth 2 (two) times daily.   gabapentin  (NEURONTIN ) 300 MG capsule TAKE 1 CAPSULE BY MOUTH TWICE A DAY   insulin  aspart (NOVOLOG  FLEXPEN) 100 UNIT/ML FlexPen Inject 16 Units into the skin 2 (two) times daily with a meal.   insulin  degludec (TRESIBA  FLEXTOUCH) 200 UNIT/ML FlexTouch Pen Inject 100 Units into the skin daily.   Insulin  Pen Needle (GLOBAL EASE INJECT PEN NEEDLES) 32G X 4 MM MISC Use for insulin  in the morning, at noon, in the evening, and at bedtime.   mesalamine  (LIALDA ) 1.2 g EC tablet Take 2 tablets (2.4 g total)  by mouth 2 (two) times daily.   metolazone  (ZAROXOLYN ) 5 MG tablet Take 1 tablet (5 mg total) by mouth daily as needed.   ondansetron  (ZOFRAN ) 4 MG tablet Take 1 tablet (4 mg total) by mouth every 8 (eight) hours as needed for nausea and vomiting   OXYGEN  Inhale 2 L into the lungs continuous.   pantoprazole  (PROTONIX ) 40 MG tablet TAKE 1 TABLET BY MOUTH EVERY DAY IN THE MORNING   potassium chloride  (MICRO-K ) 10 MEQ CR capsule TAKE 2 CAPSULES ORALLY IN THE MORNING AND 1 CAPSULE DAILY AT 12 NOON AND 2 CAPSULES EVERY EVENING   rOPINIRole  (REQUIP ) 4 MG tablet Take 1 tablet (4 mg total) by mouth at bedtime.  With extra 1/2 per day if needed.   Spacer/Aero-Holding Chambers (OPTICHAMBER DIAMOND ) MISC Use as dirtected with inhaler   spironolactone  (ALDACTONE ) 25 MG tablet Take 0.5 tablets (12.5 mg total) by mouth daily.   tirzepatide  (MOUNJARO ) 7.5 MG/0.5ML Pen Inject 7.5 mg into the skin once a week.   traMADol  (ULTRAM ) 50 MG tablet Take 1 tablet (50 mg total) by mouth 3 (three) times daily as needed.   triamcinolone  cream (KENALOG ) 0.1 % Apply 1 Application topically 2 (two) times daily as needed.   umeclidinium bromide  (INCRUSE ELLIPTA ) 62.5 MCG/ACT AEPB Inhale 1 puff into the lungs daily.     Allergies:   Almond oil, Morphine  and codeine , Primidone , Atorvastatin, Ceclor [cefaclor], Latex, Praluent  [alirocumab ], Sulfa  antibiotics, Ciprofloxacin , Levaquin  [levofloxacin ], Losartan , Peanut-containing drug products, Repatha  [evolocumab ], and Statins   ROS:   Please see the history of present illness.    All other systems reviewed and are negative.  EKGs/Labs/Other Studies Reviewed:    The following studies were reviewed today: Cardiac Studies & Procedures   ______________________________________________________________________________________________   STRESS TESTS  MYOCARDIAL PERFUSION IMAGING 06/07/2019  Interpretation Summary  The left ventricular ejection fraction is hyperdynamic (>65%).  Nuclear stress EF: 83%.  There was no ST segment deviation noted during stress.  The study is normal.  This is a low risk study.  No ischemia or infarction on perfusion imaging.   ECHOCARDIOGRAM  ECHOCARDIOGRAM COMPLETE 12/22/2022  Narrative ECHOCARDIOGRAM REPORT    Patient Name:   JACKALYNN ART   Date of Exam: 12/22/2022 Medical Rec #:  [id number]     Height:       65.0 in Accession #:    [id number]    Weight:       260.1 lb Date of Birth:  [birth year]     BSA:          2.212 m Patient Age:    26 years      BP:           118/80 mmHg Patient Gender: F             HR:           91  bpm. Exam Location:  [address]  Procedure: 2D Echo, Cardiac Doppler, Color Doppler and 3D Echo  Indications:    Chronic Diastolic Heart Failure I50.32  History:        Patient has prior history of Echocardiogram examinations, most recent 05/18/2019. Risk Factors:Hypertension, Dyslipidemia and Diabetes.  Sonographer:    Augustin Seals RDCS Referring Phys: [phone] Lavenia Stumpo  IMPRESSIONS   1. Mild intracavitary gradient. Peak velocity 2.0 m/s. Peak gradient 17 mmHg. With Valsalva peak gradient increases to 2.7 m/s. Peak gradient increases to 30 mmHg. Left ventricular ejection fraction, by estimation, is 65 to 70%. The left  ventricle has normal function. The left ventricle has no regional wall motion abnormalities. There is mild concentric left ventricular hypertrophy. Left ventricular diastolic parameters are consistent with Grade I diastolic dysfunction (impaired relaxation). Elevated left ventricular end-diastolic pressure. 2. Right ventricular systolic function is normal. The right ventricular size is normal. There is normal pulmonary artery systolic pressure. 3. The mitral valve is normal in structure. No evidence of mitral valve regurgitation. No evidence of mitral stenosis. 4. The aortic valve is normal in structure. Aortic valve regurgitation is mild. No aortic stenosis is present. 5. Aortic dilatation noted. There is mild dilatation of the ascending aorta, measuring 40 mm. 6. The inferior vena cava is normal in size with greater than 50% respiratory variability, suggesting right atrial pressure of 3 mmHg.  FINDINGS Left Ventricle: Mild intracavitary gradient. Peak velocity 2.0 m/s. Peak gradient 17 mmHg. With Valsalva peak gradient increases to 2.7 m/s. Peak gradient increases to 30 mmHg. Left ventricular ejection fraction, by estimation, is 65 to 70%. The left ventricle has normal function. The left ventricle has no regional wall motion abnormalities. The left ventricular  internal cavity size was normal in size. There is mild concentric left ventricular hypertrophy. Left ventricular diastolic parameters are consistent with Grade I diastolic dysfunction (impaired relaxation). Elevated left ventricular end-diastolic pressure.  Right Ventricle: The right ventricular size is normal. No increase in right ventricular wall thickness. Right ventricular systolic function is normal. There is normal pulmonary artery systolic pressure. The tricuspid regurgitant velocity is 1.53 m/s, and with an assumed right atrial pressure of 3 mmHg, the estimated right ventricular systolic pressure is 12.4 mmHg.  Left Atrium: Left atrial size was normal in size.  Right Atrium: Right atrial size was normal in size.  Pericardium: There is no evidence of pericardial effusion.  Mitral Valve: The mitral valve is normal in structure. No evidence of mitral valve regurgitation. No evidence of mitral valve stenosis.  Tricuspid Valve: The tricuspid valve is normal in structure. Tricuspid valve regurgitation is trivial. No evidence of tricuspid stenosis.  Aortic Valve: The aortic valve is normal in structure. Aortic valve regurgitation is mild. No aortic stenosis is present.  Pulmonic Valve: The pulmonic valve was normal in structure. Pulmonic valve regurgitation is not visualized. No evidence of pulmonic stenosis.  Aorta: Aortic dilatation noted. There is mild dilatation of the ascending aorta, measuring 40 mm.  Venous: The inferior vena cava is normal in size with greater than 50% respiratory variability, suggesting right atrial pressure of 3 mmHg.  IAS/Shunts: No atrial level shunt detected by color flow Doppler.   LEFT VENTRICLE PLAX 2D LVIDd:         4.60 cm   Diastology LVIDs:         3.20 cm   LV e' medial:    5.43 cm/s LV PW:         1.10 cm   LV E/e' medial:  16.5 LV IVS:        1.10 cm   LV e' lateral:   6.05 cm/s LVOT diam:     2.30 cm   LV E/e' lateral: 14.8 LVOT Area:      4.15 cm  3D Volume EF: 3D EF:        68 % LV EDV:       85 ml LV ESV:       27 ml LV SV:        58 ml  RIGHT VENTRICLE RV Basal diam:  2.30 cm RV Mid diam:  2.00 cm RV S prime:     9.55 cm/s  LEFT ATRIUM             Index        RIGHT ATRIUM          Index LA diam:        4.10 cm 1.85 cm/m   RA Area:     8.49 cm LA Vol (A2C):   31.2 ml 14.10 ml/m  RA Volume:   13.00 ml 5.88 ml/m LA Vol (A4C):   27.3 ml 12.34 ml/m LA Biplane Vol: 29.6 ml 13.38 ml/m  AORTA Ao Root diam: 2.90 cm Ao Asc diam:  4.00 cm  MITRAL VALVE                TRICUSPID VALVE MV Area (PHT): 3.60 cm     TR Peak grad:   9.4 mmHg MV Decel Time: 211 msec     TR Vmax:        153.00 cm/s MV E velocity: 89.60 cm/s MV A velocity: 133.00 cm/s  SHUNTS MV E/A ratio:  0.67         Systemic Diam: 2.30 cm  Annabella Scarce MD Electronically signed by Annabella Scarce MD Signature Date/Time: 12/22/2022/5:10:36 PM    Final        CARDIAC MRI  MR CARDIAC MORPHOLOGY W WO CONTRAST 01/01/2020  Narrative CLINICAL DATA:  Assess RV hypertrophy.  EXAM: CARDIAC MRI  TECHNIQUE: The patient was scanned on a 1.5 Tesla GE magnet. A dedicated cardiac coil was used. Functional imaging was done using Fiesta sequences. 2,3, and 4 chamber views were done to assess for RWMA's. Modified Simpson's rule using a short axis stack was used to calculate an ejection fraction on a dedicated work [employer]. The patient received 10 cc of Gadavist . After 10 minutes inversion recovery sequences were used to assess for infiltration and scar tissue.  CONTRAST:  10 cc Gadavist   FINDINGS: Limited images of the lung fields showed no gross abnormalities.  There is very extensive epicardial adipose tissue. Lipomatous atrial septal hypertrophy.  Normal left ventricular size with mild concentric LV hypertrophy. Hyperdynamic systolic function, EF 78%, normal wall motion. There is evidence of turbulence  across the LV outflow tract. Normal right ventricular size and systolic function, EF 64%. No evidence for RV hypertrophy. Normal right and left atrial sizes. Trileaflet aortic valve with no significant stenosis or regurgitation. Trivial mitral regurgitation.  There is mid-wall late gadolinium enhancement in the mid inferoseptal wall at the RV insertion site.  Measurements:  LVEDV 77 mL LVSV 60 mL LVEF 78%  RVEDV 63 mL RVSV 40 mL RVEF 64%  IMPRESSION: 1. Normal LV size with mild LV hypertrophy, hyperdynamic systolic function with EF 78%.  2. Normal RV size and systolic function, EF 64%. There does not appear to be true RV hypertrophy present. There is very thick epicardial adipose tissue that may have been mistaken as RVH on the echo.  3. There was a small area of mid-wall LGE at the RV insertion site. This is nonspecific and may be suggestive of elevated filling pressures.  Dalton Mclean   Electronically Signed By: Ezra Shuck M.D. On: 01/01/2020 17:46   ______________________________________________________________________________________________      EKG:        Recent Labs: 05/27/2023: B Natriuretic Peptide 53.9 12/06/2023: ALT 34; BUN 21; Creatinine, Ser 1.17; Hemoglobin 13.7; Magnesium  1.8; Platelets 262.0; Potassium 4.1; Sodium 141; TSH 2.55  Recent Lipid Panel  Component Value Date/Time   CHOL 195 09/28/2023 1039   CHOL 202 (H) 11/25/2021 1258   CHOL 333 04/26/2019 0000   TRIG (H) 09/28/2023 1039    670.0 Triglyceride is over 400; calculations on Lipids are invalid.   TRIG 469 (A) 04/26/2019 0000   HDL 40.80 09/28/2023 1039   HDL 47 11/25/2021 1258   CHOLHDL 5 09/28/2023 1039   VLDL 134.0 (H) 09/28/2023 1039   LDLCALC 105 (H) 11/25/2021 1258   LDLDIRECT 80.0 09/28/2023 1039           Physical Exam:    VS:  BP (!) 148/79 (BP Location: Left Arm, Patient Position: Sitting, Cuff Size: Large)   Pulse 98   Resp 16   Ht 5' 5 (1.651 m)    Wt 245 lb (111.1 kg)   SpO2 98%   BMI 40.77 kg/m     Wt Readings from Last 3 Encounters:  01/25/24 245 lb (111.1 kg)  11/17/23 252 lb (114.3 kg)  10/05/23 252 lb (114.3 kg)     GEN: Pleasant, obese woman in no acute distress HEENT: Normal NECK: No JVD; No carotid bruits LYMPHATICS: No lymphadenopathy CARDIAC: RRR, 2/6 systolic murmur at the left lower sternal border RESPIRATORY: Poor air movement bilaterally ABDOMEN: Soft, non-tender, non-distended MUSCULOSKELETAL:  No edema; No deformity  SKIN: Warm and dry NEUROLOGIC:  Alert and oriented x 3 PSYCHIATRIC:  Normal affect   Assessment & Plan Chronic heart failure with preserved ejection fraction (HCC) Continue current diuretic therapy.  No evidence of volume overload on exam.  JVP is normal and there is no lower extremity edema present.  Furosemide  60 mg twice daily and metolazone  5 mg up to twice weekly as needed.  Echo from last year reviewed with the mild mid cavitary gradient, normal LVEF of 65 to 70%, and grade 1 diastolic dysfunction.  RV function and PA pressure estimates are normal. Essential hypertension Continue current management.  States home readings are lower than her office reading today. Aortic atherosclerosis  intolerant to statins and PCSK9 inhibitors. Type 2 diabetes mellitus with hyperglycemia, without long-term current use of insulin  (HCC) Management per PCP.  Seems to be improving.  Discussed the idea of an SGLT2, but she has had no problems with urinary tract infections and with her hyperglycemia and elevated hemoglobin A1c greater than 10, I think the risk outweighs the benefit. Stage 3a chronic kidney disease (HCC) Stable on most recent labs which were reviewed today. Chest pressure Recommend a stress Myoview  scan to evaluate for ischemia.  Informed consent obtained.  Last stress Myoview  was in 2021.       Informed Consent   Shared Decision Making/Informed Consent The risks [chest pain, shortness of  breath, cardiac arrhythmias, dizziness, blood pressure fluctuations, myocardial infarction, stroke/transient ischemic attack, nausea, vomiting, allergic reaction, radiation exposure, metallic taste sensation and life-threatening complications (estimated to be 1 in 10,000)], benefits (risk stratification, diagnosing coronary artery disease, treatment guidance) and alternatives of a nuclear stress test were discussed in detail with Ms. Mashaw and she agrees to proceed.       Medication Adjustments/Labs and Tests Ordered: Current medicines are reviewed at length with the patient today.  Concerns regarding medicines are outlined above.  Orders Placed This Encounter  Procedures   MYOCARDIAL PERFUSION IMAGING   No orders of the defined types were placed in this encounter.   Patient Instructions  Medication Instructions:  No medication changes were made at this visit. Continue current regimen.   *If you need  a refill on your cardiac medications before your next appointment, please call your pharmacy*  Lab Work: None ordered today. If you have labs (blood work) drawn today and your tests are completely normal, you will receive your results only by: MyChart Message (if you have MyChart) OR A paper copy in the mail If you have any lab test that is abnormal or we need to change your treatment, we will call you to review the results.  Testing/Procedures: Your physician has requested that you have a lexiscan  myoview . For further information please visit [email or web address] Please follow instruction sheet, as given.   Follow-Up: At [facility], you and your health needs are our priority.  As part of our continuing mission to provide you with exceptional heart care, our providers are all part of one team.  This team includes your primary Cardiologist (physician) and Advanced Practice Providers or APPs (Physician Assistants and Nurse Practitioners) who all work together to provide you with  the care you need, when you need it.  Your next appointment:   1 year(s)  Provider:   Ozell Fell, MD    We recommend signing up for the patient portal called MyChart.  Sign up information is provided on this After Visit Summary.  MyChart is used to connect with patients for Virtual Visits (Telemedicine).  Patients are able to view lab/test results, encounter notes, upcoming appointments, etc.  Non-urgent messages can be sent to your provider as well.   To learn more about what you can do with MyChart, go to [email or web address].   Other Instructions Lexiscan  Myoview  (Stress Test) Instructions  Please arrive 15 minutes prior to your appointment time for registration and insurance purposes.   The test will take approximately 3 to 4 hours to complete; you may bring reading material.  If someone comes with you to your appointment, they will need to remain in the main lobby due to limited space in the testing area. **If you are pregnant or breastfeeding, please notify the nuclear lab prior to your appointment**   How to prepare for your Myocardial Perfusion Test: Do not eat or drink 3 hours prior to your test, except you may have water. Do not consume products containing caffeine  (regular or decaffeinated) 12 hours prior to your test. (ex: coffee, chocolate, sodas, tea). Do bring a list of your current medications with you.  If not listed below, you may take your medications as normal. Do wear comfortable clothes (no dresses or overalls) and walking shoes, tennis shoes preferred (No heels or open toe shoes are allowed). Do NOT wear cologne, perfume, aftershave, or lotions (deodorant is allowed). If these instructions are not followed, your test will have to be rescheduled.   Please report to [address] [place], [place] [address] for your test.  If you have questions or concerns about your appointment, you can call the Nuclear Lab at [phone].   If you cannot keep your  appointment, please provide 24 hours notification to the Nuclear Lab, to avoid a possible $50 charge to your account.     Signed, Ozell Fell, MD  01/25/2024 11:46 AM    [facility]

## 2024-01-25 NOTE — Patient Instructions (Signed)
 Medication Instructions:  No medication changes were made at this visit. Continue current regimen.   *If you need a refill on your cardiac medications before your next appointment, please call your pharmacy*  Lab Work: None ordered today. If you have labs (blood work) drawn today and your tests are completely normal, you will receive your results only by: MyChart Message (if you have MyChart) OR A paper copy in the mail If you have any lab test that is abnormal or we need to change your treatment, we will call you to review the results.  Testing/Procedures: Your physician has requested that you have a lexiscan  myoview . For further information please visit [email or web address] Please follow instruction sheet, as given.   Follow-Up: At [facility], you and your health needs are our priority.  As part of our continuing mission to provide you with exceptional heart care, our providers are all part of one team.  This team includes your primary Cardiologist (physician) and Advanced Practice Providers or APPs (Physician Assistants and Nurse Practitioners) who all work together to provide you with the care you need, when you need it.  Your next appointment:   1 year(s)  Provider:   Ozell Fell, MD    We recommend signing up for the patient portal called MyChart.  Sign up information is provided on this After Visit Summary.  MyChart is used to connect with patients for Virtual Visits (Telemedicine).  Patients are able to view lab/test results, encounter notes, upcoming appointments, etc.  Non-urgent messages can be sent to your provider as well.   To learn more about what you can do with MyChart, go to [email or web address].   Other Instructions Lexiscan  Myoview  (Stress Test) Instructions  Please arrive 15 minutes prior to your appointment time for registration and insurance purposes.   The test will take approximately 3 to 4 hours to complete; you may bring reading material.   If someone comes with you to your appointment, they will need to remain in the main lobby due to limited space in the testing area. **If you are pregnant or breastfeeding, please notify the nuclear lab prior to your appointment**   How to prepare for your Myocardial Perfusion Test: Do not eat or drink 3 hours prior to your test, except you may have water. Do not consume products containing caffeine  (regular or decaffeinated) 12 hours prior to your test. (ex: coffee, chocolate, sodas, tea). Do bring a list of your current medications with you.  If not listed below, you may take your medications as normal. Do wear comfortable clothes (no dresses or overalls) and walking shoes, tennis shoes preferred (No heels or open toe shoes are allowed). Do NOT wear cologne, perfume, aftershave, or lotions (deodorant is allowed). If these instructions are not followed, your test will have to be rescheduled.   Please report to [address] [place], [place] [address] for your test.  If you have questions or concerns about your appointment, you can call the Nuclear Lab at [phone].   If you cannot keep your appointment, please provide 24 hours notification to the Nuclear Lab, to avoid a possible $50 charge to your account.

## 2024-01-26 ENCOUNTER — Other Ambulatory Visit: Payer: Self-pay | Admitting: Cardiovascular Disease

## 2024-01-26 DIAGNOSIS — R0789 Other chest pain: Secondary | ICD-10-CM

## 2024-01-31 ENCOUNTER — Telehealth (HOSPITAL_COMMUNITY): Payer: Self-pay | Admitting: *Deleted

## 2024-01-31 NOTE — Telephone Encounter (Signed)
 Patient given detailed instructions per Myocardial Perfusion Study Information Sheet for the test on 02/02/24 Patient notified to arrive 15 minutes early and that it is imperative to arrive on time for appointment to keep from having the test rescheduled.  If you need to cancel or reschedule your appointment, please call the office within 24 hours of your appointment. . Patient verbalized understanding.Claudene Ronal Quale, RN

## 2024-02-02 ENCOUNTER — Other Ambulatory Visit: Payer: Self-pay | Admitting: Family Medicine

## 2024-02-02 ENCOUNTER — Ambulatory Visit (HOSPITAL_COMMUNITY)
Admission: RE | Admit: 2024-02-02 | Discharge: 2024-02-02 | Disposition: A | Discharge status: Home / Self Care | Source: Ambulatory Visit | Attending: Cardiology | Admitting: Cardiology

## 2024-02-02 DIAGNOSIS — I5032 Chronic diastolic (congestive) heart failure: Secondary | ICD-10-CM

## 2024-02-02 DIAGNOSIS — R0789 Other chest pain: Secondary | ICD-10-CM | POA: Insufficient documentation

## 2024-02-02 LAB — MYOCARDIAL PERFUSION IMAGING
LV dias vol: 57 mL (ref 46–106)
LV sys vol: 17 mL (ref 3.8–5.2)
Nuc Stress EF: 70 %
Peak HR: 110 {beats}/min
Rest HR: 87 {beats}/min
Rest Nuclear Isotope Dose: 10.3 mCi
SDS: 0
SRS: 0
SSS: 0
ST Depression (mm): 0 mm
Stress Nuclear Isotope Dose: 31.6 mCi
TID: 1

## 2024-02-02 MED ORDER — TECHNETIUM TC 99M TETROFOSMIN IV KIT
31.6000 | PACK | Freq: Once | INTRAVENOUS | Status: AC | PRN
Start: 2024-02-02 — End: 2024-02-02
  Administered 2024-02-02: 31.6 via INTRAVENOUS

## 2024-02-02 MED ORDER — REGADENOSON 0.4 MG/5ML IV SOLN
INTRAVENOUS | Status: AC
  Filled 2024-02-02: qty 5

## 2024-02-02 MED ORDER — REGADENOSON 0.4 MG/5ML IV SOLN
0.4000 mg | Freq: Once | INTRAVENOUS | Status: AC
  Administered 2024-02-02: 0.4 mg via INTRAVENOUS

## 2024-02-02 MED ORDER — TECHNETIUM TC 99M TETROFOSMIN IV KIT
10.3000 | PACK | Freq: Once | INTRAVENOUS | Status: AC | PRN
  Administered 2024-02-02: 10.3 via INTRAVENOUS

## 2024-02-06 ENCOUNTER — Ambulatory Visit: Payer: Self-pay | Admitting: Cardiovascular Disease

## 2024-02-06 ENCOUNTER — Other Ambulatory Visit: Payer: Self-pay | Admitting: Cardiovascular Disease

## 2024-02-27 ENCOUNTER — Other Ambulatory Visit: Payer: Self-pay | Admitting: Family Medicine

## 2024-02-27 DIAGNOSIS — M109 Gout, unspecified: Secondary | ICD-10-CM

## 2024-02-28 ENCOUNTER — Telehealth: Admitting: Physician Assistant

## 2024-02-28 DIAGNOSIS — B9689 Other specified bacterial agents as the cause of diseases classified elsewhere: Secondary | ICD-10-CM

## 2024-02-28 DIAGNOSIS — J019 Acute sinusitis, unspecified: Secondary | ICD-10-CM | POA: Diagnosis not present

## 2024-02-28 MED ORDER — AMOXICILLIN-POT CLAVULANATE 875-125 MG PO TABS: 1.0000 | ORAL_TABLET | Freq: Two times a day (BID) | ORAL | 0 refills | Status: AC

## 2024-02-28 MED ORDER — FLUTICASONE PROPIONATE 50 MCG/ACT NA SUSP: 2.0000 | Freq: Every day | NASAL | 0 refills | Status: AC

## 2024-02-28 NOTE — Telephone Encounter (Signed)
 [facility]  Last filled:  01/26/24, #30 Last OV:  12/06/23, fatigue Next OV:  none

## 2024-02-28 NOTE — Progress Notes (Signed)
 E-Visit for Sinus Problems  We are sorry that you are not feeling well.  Here is how we plan to help!  Based on what you have shared with me it looks like you have sinusitis.  Sinusitis is inflammation and infection in the sinus cavities of the head.  Based on your presentation I believe you most likely have Acute Bacterial Sinusitis.  This is an infection caused by bacteria and is treated with antibiotics. I have prescribed Augmentin  875mg /125mg  one tablet twice daily with food, for 7 days. and I have also prescribed Flonase  Nasal Spray Use 2 sprays in each nostril daily for 10-14 days You may use an oral decongestant such as Mucinex  D or if you have glaucoma or high blood pressure use plain Mucinex . Saline nasal spray help and can safely be used as often as needed for congestion.  If you develop worsening sinus pain, fever or notice severe headache and vision changes, or if symptoms are not better after completion of antibiotic, please schedule an appointment with a health care provider.    Sinus infections are not as easily transmitted as other respiratory infection, however we still recommend that you avoid close contact with loved ones, especially the very young and elderly.  Remember to wash your hands thoroughly throughout the day as this is the number one way to prevent the spread of infection!  Home Care: Only take medications as instructed by your medical team. Complete the entire course of an antibiotic. Do not take these medications with alcohol. A steam or ultrasonic humidifier can help congestion.  You can place a towel over your head and breathe in the steam from hot water coming from a faucet. Avoid close contacts especially the very young and the elderly. Cover your mouth when you cough or sneeze. Always remember to wash your hands.  Get Help Right Away If: You develop worsening fever or sinus pain. You develop a severe head ache or visual changes. Your symptoms persist after  you have completed your treatment plan.  Make sure you Understand these instructions. Will watch your condition. Will get help right away if you are not doing well or get worse.  Your e-visit answers were reviewed by a board certified advanced clinical practitioner to complete your personal care plan.  Depending on the condition, your plan could have included both over the counter or prescription medications.  If there is a problem please reply  once you have received a response from your provider.  Your safety is important to us .  If you have drug allergies check your prescription carefully.    You can use MyChart to ask questions about today's visit, request a non-urgent call back, or ask for a work or school excuse for 24 hours related to this e-Visit. If it has been greater than 24 hours you will need to follow up with your provider, or enter a new e-Visit to address those concerns.  You will get an e-mail in the next two days asking about your experience.  I hope that your e-visit has been valuable and will speed your recovery. Thank you for using e-visits.  I have spent 5 minutes in review of e-visit questionnaire, review and updating patient chart, medical decision making and response to patient.   Delon CHRISTELLA Dickinson, PA-C

## 2024-02-29 ENCOUNTER — Other Ambulatory Visit: Payer: Self-pay | Admitting: Family Medicine

## 2024-03-07 ENCOUNTER — Telehealth: Payer: Self-pay | Admitting: *Deleted

## 2024-03-07 ENCOUNTER — Other Ambulatory Visit: Payer: Self-pay | Admitting: Family Medicine

## 2024-03-07 NOTE — Telephone Encounter (Signed)
 Copied from CRM #[id number]. Topic: General - Other >> Mar 07, 2024  2:00 PM Pinkey ORN wrote: Reason for CRM: General >> Mar 07, 2024  2:04 PM Pinkey ORN wrote: Patient states she just joined [facility]. Patient states they're requesting for Dr. Cleatus to contact and verify that patient does in fact have diabetes. Patient states the deadline was today.  Prior Authorization Dept , Option 3 [phone]

## 2024-03-08 NOTE — Telephone Encounter (Signed)
 Left message to return call to our office.

## 2024-03-14 NOTE — Progress Notes (Incomplete)
 "   Assessment/Plan:   Tremors of unclear etiology  Patient was last seen on 07/13/2023.     Symptoms are***.  She denies dropping objects, she may have difficulty picking them up from the floor attributed to body habitus   On exam, tremors are improved, not seen at the wrists or with distraction, only on in tension.  His tremors are intermittent.  There might be a component of if in the affecting her movements.  She continues to use a walker to ambulate due to RLS.  She reports that her RLS is also better controlled with gabapentin  in 300 mg twice daily and ropinirole  4 mg nightly.  She has not done PT or OT (stating that this is due to high glucose which will interfere with PT)   Mood is improved with Wellbutrin  300 mg every 24 hours and Cymbalta  60 mg nightly.  She states that she does not need psychotherapy.          Recommendations:   Discussed the importance of taking medications as directed The patient does not wish to take primidone  because she is concerned about breathing issues with this medication She takes ropinirole  4 mg nightly and gabapentin  300 mg twice daily, does not wish to make any adjustments  Commend PT for strength and balance and OT for tremor once glucose is at therapeutic range Recommend psychotherapy for depression Once again she was educated about COPD medications and tremors Search [employer] at the  the physical-occupational therapy aids such as handy things gloves Recommend heavy spoons- forks as prior mentioned to her Follow-up in 6 months    Subjective:    Kathleen Hatfield was seen today in follow up for tremors of unclear etiology.  She is here alone.  Tremors are improving as she may have some adjustments in her medications, including her chronic UTI meds.  She may occasionally have rest tremors unless distracted.  Intention tremors are improved.  She does not drop objects, but has difficulty picking them in view of her body habitus.  She denies any drooling  or trouble swallowing.  She uses a walker to ambulate or to lean against it especially since she has RLS.  She reports needing a walker or a wheelchair to ambulate if she feels unstable on her feet.  Mood disorder is controlled although she has not been seeking psychotherapy.        ALLERGIES:   Allergies  Allergen Reactions   Almond Oil Anaphylaxis, Shortness Of Breath and Swelling   Morphine  And Codeine  Shortness Of Breath and Other (See Comments)    Pt. States while in the hospital it affected her breathing, O2 dropped to the 70's   Primidone  Shortness Of Breath        Atorvastatin Other (See Comments)    Leg weakness   Ceclor [Cefaclor] Nausea And Vomiting   Latex Rash    Material used for CPAP strap caused severe skin irritation.   Not sure if she is allergic to all latex   Praluent  [Alirocumab ]    Sulfa  Antibiotics Nausea And Vomiting   Ciprofloxacin  Other (See Comments)    Makes joints and muscles ache   Levaquin  [Levofloxacin ] Other (See Comments)    Body aches   Losartan  Other (See Comments)    Weakness    Peanut-Containing Drug Products     Pt states she thinks it is causing gout flare-ups   Repatha  [Evolocumab ]     Aches   Statins Other (See Comments)  Leg and body weakness    CURRENT MEDICATIONS:  Outpatient Encounter Medications as of 03/16/2024  Medication Sig   acetaminophen  (TYLENOL ) 500 MG tablet Take 1,000 mg by mouth every 8 (eight) hours as needed.   albuterol  (VENTOLIN  HFA) 108 (90 Base) MCG/ACT inhaler INHALE 2 PUFFS BY MOUTH TWICE A DAY TO 3 TIMES A DAY AS NEEDED FOR WHEEZE /SHORTNESS OF BREATH   amoxicillin -clavulanate (AUGMENTIN ) 875-125 MG tablet Take 1 tablet by mouth 2 (two) times daily.   arformoterol  (BROVANA ) 15 MCG/2ML NEBU Substituted for: Brovana  Neb Solution Inhale one vial in nebulizer twice a day.   budesonide  (PULMICORT ) 0.5 MG/2ML nebulizer solution Generic for Pulmicort . Inhale one vial in nebulizer twice a day. Rinse mouth after use.    buPROPion  (WELLBUTRIN  XL) 300 MG 24 hr tablet Take 1 tablet (300 mg total) by mouth in the morning.   Cholecalciferol  (VITAMIN D -3) 5000 UNITS TABS Take 5,000 Units by mouth every other day.    colchicine  0.6 MG tablet TAKE 1 TABLET (0.6 MG TOTAL) BY MOUTH DAILY AS NEEDED (FOR GOUT)   cyanocobalamin  (VITAMIN B12) 1000 MCG tablet Take 1 tablet (1,000 mcg total) by mouth daily.   dicyclomine  (BENTYL ) 20 MG tablet Take 1 tablet (20 mg total) by mouth 4 (four) times daily as needed for GI cramping, pain, nausea/vomiting.   DULoxetine  (CYMBALTA ) 60 MG capsule Take 1 capsule (60 mg total) by mouth at bedtime.   fluticasone  (FLONASE ) 50 MCG/ACT nasal spray Place 2 sprays into both nostrils daily.   furosemide  (LASIX ) 20 MG tablet TAKE 3 TABLETS (60 MG TOTAL) BY MOUTH 2 (TWO) TIMES DAILY.   gabapentin  (NEURONTIN ) 300 MG capsule TAKE 1 CAPSULE BY MOUTH TWICE A DAY   insulin  aspart (NOVOLOG  FLEXPEN) 100 UNIT/ML FlexPen Inject 16 Units into the skin 2 (two) times daily with a meal.   insulin  degludec (TRESIBA  FLEXTOUCH) 200 UNIT/ML FlexTouch Pen Inject 100 Units into the skin daily.   Insulin  Pen Needle (GLOBAL EASE INJECT PEN NEEDLES) 32G X 4 MM MISC Use for insulin  in the morning, at noon, in the evening, and at bedtime.   mesalamine  (LIALDA ) 1.2 g EC tablet Take 2 tablets (2.4 g total) by mouth 2 (two) times daily.   metolazone  (ZAROXOLYN ) 5 MG tablet Take 1 tablet (5 mg total) by mouth daily as needed.   ondansetron  (ZOFRAN ) 4 MG tablet Take 1 tablet (4 mg total) by mouth every 8 (eight) hours as needed for nausea and vomiting   OXYGEN  Inhale 2 L into the lungs continuous.   pantoprazole  (PROTONIX ) 40 MG tablet TAKE 1 TABLET BY MOUTH EVERY DAY IN THE MORNING   potassium chloride  (MICRO-K ) 10 MEQ CR capsule TAKE 2 CAPSULES ORALLY IN THE MORNING AND 1 CAPSULE DAILY AT 12 NOON AND 2 CAPSULES EVERY EVENING   rOPINIRole  (REQUIP ) 4 MG tablet TAKE 1 TABLET BY MOUTH EVERYDAY AT BEDTIME   Spacer/Aero-Holding  Chambers (OPTICHAMBER DIAMOND ) MISC Use as dirtected with inhaler   spironolactone  (ALDACTONE ) 25 MG tablet TAKE 1/2 TABLET BY MOUTH EVERY DAY   tirzepatide  (MOUNJARO ) 7.5 MG/0.5ML Pen Inject 7.5 mg into the skin once a week.   traMADol  (ULTRAM ) 50 MG tablet Take 1 tablet (50 mg total) by mouth 3 (three) times daily as needed.   triamcinolone  cream (KENALOG ) 0.1 % Apply 1 Application topically 2 (two) times daily as needed.   umeclidinium bromide  (INCRUSE ELLIPTA ) 62.5 MCG/ACT AEPB Inhale 1 puff into the lungs daily.   No facility-administered encounter medications on file as  of 03/16/2024.    Objective:   PHYSICAL EXAMINATION:    VITALS:   There were no vitals filed for this visit.    GEN:  The patient appears stated age and is in NAD. HEENT:  Normocephalic, atraumatic.  The mucous membranes are moist. The superficial temporal arteries are without ropiness or tenderness.  Neurological examination:  Orientation: The patient is alert and oriented x3. Cranial nerves: There is good facial symmetry with very mild facial hypomimia. The speech is fluent and clear. Soft palate rises symmetrically and there is no tongue deviation. Hearing is intact to conversational tone. Sensation: Sensation is intact to light touch throughout Motor: Strength is at least antigravity x4.  Movement examination: Tone: There is normal tone in the upper and lower extremities.  No cogwheeling. Abnormal movements: No postural asterixis, very mild B  rest tremor when distracted.  Tremors when holding heavy objects.  No leg or facial tremors Coordination:  There is no  decremation with RAM's or FNF,   Gait and Station: The patient has significant difficulty arising out of a wheel chair without the use of the hands, needs assistance. The patient's stride length is short.  Gait is wide the base and has difficulty turning      Total time spent on today's visit was 26 minutes, including both face-to-face time and  nonface-to-face time.  Time included that spent on review of records (prior notes available to me/labs/imaging if pertinent), discussing treatment and goals, answering patient's questions and coordinating care.  Cc:  Cleatus Arlyss RAMAN, MD  "

## 2024-03-16 ENCOUNTER — Ambulatory Visit: Admitting: Physician Assistant

## 2024-03-16 ENCOUNTER — Encounter: Payer: Self-pay | Admitting: Neurology

## 2024-03-16 ENCOUNTER — Encounter: Payer: Self-pay | Admitting: Physician Assistant

## 2024-03-16 NOTE — Telephone Encounter (Signed)
 Placed in Dr Elfredia box on his desk   Copied from CRM [id number]. Topic: General - Other >> Mar 07, 2024  2:00 PM Pinkey ORN wrote: Reason for CRM: General >> Mar 16, 2024  2:46 PM Donna BRAVO wrote: Patient calling asking about paperwork for insurance.  Read Note Verbatim: Sebastian Danna GRADE, CMA    03/08/24 10:57 AM Note Left message to return call to our office.  Joellen has left for the day. Patient insisting to speak with someone   >> Mar 07, 2024  2:04 PM Pinkey ORN wrote: Patient states she just joined [facility]. Patient states they're requesting for Dr. Cleatus to contact and verify that patient does in fact have diabetes. Patient states the deadline was today.  Prior Authorization Dept , Option 3 [phone]

## 2024-03-16 NOTE — Telephone Encounter (Signed)
 I am unexpectedly out of office. Please send note verifying that she does have DM2.  Thanks.

## 2024-03-17 NOTE — Telephone Encounter (Signed)
 Spoke with May at [facility]. She stated they could take verbal statement concerning pt's Dx of DM. Gave May verbal Dx.

## 2024-03-21 ENCOUNTER — Telehealth: Payer: Self-pay

## 2024-03-21 ENCOUNTER — Ambulatory Visit: Admitting: Internal Medicine

## 2024-03-21 ENCOUNTER — Encounter: Payer: Self-pay | Admitting: Internal Medicine

## 2024-03-21 VITALS — BP 128/80 | Ht 65.0 in | Wt 247.0 lb

## 2024-03-21 DIAGNOSIS — N1831 Chronic kidney disease, stage 3a: Secondary | ICD-10-CM | POA: Diagnosis not present

## 2024-03-21 DIAGNOSIS — E1159 Type 2 diabetes mellitus with other circulatory complications: Secondary | ICD-10-CM | POA: Diagnosis not present

## 2024-03-21 DIAGNOSIS — Z794 Long term (current) use of insulin: Secondary | ICD-10-CM

## 2024-03-21 DIAGNOSIS — E1165 Type 2 diabetes mellitus with hyperglycemia: Secondary | ICD-10-CM | POA: Diagnosis not present

## 2024-03-21 DIAGNOSIS — E1142 Type 2 diabetes mellitus with diabetic polyneuropathy: Secondary | ICD-10-CM

## 2024-03-21 DIAGNOSIS — E1122 Type 2 diabetes mellitus with diabetic chronic kidney disease: Secondary | ICD-10-CM

## 2024-03-21 LAB — POCT GLYCOSYLATED HEMOGLOBIN (HGB A1C): Hemoglobin A1C: 10.4 % — AB (ref 4.0–5.6)

## 2024-03-21 MED ORDER — TRESIBA FLEXTOUCH 200 UNIT/ML ~~LOC~~ SOPN: 110.0000 [IU] | PEN_INJECTOR | Freq: Every day | SUBCUTANEOUS | 3 refills | Status: AC

## 2024-03-21 MED ORDER — NOVOLOG FLEXPEN 100 UNIT/ML ~~LOC~~ SOPN: 20.0000 [IU] | PEN_INJECTOR | Freq: Two times a day (BID) | SUBCUTANEOUS | 6 refills | Status: DC

## 2024-03-21 MED ORDER — FREESTYLE LIBRE 2 PLUS SENSOR MISC: 1.0000 | 3 refills | Status: AC

## 2024-03-21 MED ORDER — TIRZEPATIDE 10 MG/0.5ML ~~LOC~~ SOAJ: 10.0000 mg | SUBCUTANEOUS | 3 refills | Status: AC

## 2024-03-21 MED ORDER — OMNIPOD 5 LIBRE2 PLUS G6 PODS MISC: 1.0000 | 3 refills | Status: DC

## 2024-03-21 MED ORDER — GLOBAL EASE INJECT PEN NEEDLES 32G X 4 MM MISC: 1.0000 | Freq: Four times a day (QID) | 3 refills | Status: AC

## 2024-03-21 NOTE — Progress Notes (Unsigned)
 "   Name: Kathleen Hatfield  Age/ Sex: 69 y.o., female   MRN/ DOB: [id number], 22-Mar-1955     PCP: Cleatus Arlyss RAMAN, MD   Reason for Endocrinology Evaluation: Type 2 Diabetes Mellitus  Initial Endocrine Consultative Visit: 05/14/2021    PATIENT IDENTIFIER: Kathleen Hatfield Hatfield a 69 y.o. female with a past medical history of DM, CHF, COPS. OSA on CPAP, GERD. The patient has followed with Endocrinology clinic since 05/14/2021 for consultative assistance with management of her diabetes.  DIABETIC HISTORY:  Kathleen Hatfield was diagnosed with DM 2017, and started insulin  therapyin 2000. Her hemoglobin A1c has ranged from 7.4% in 2017, peaking at 11.5% in 2022.   She was seen by Dr. Kassie once 05/2021  Ozempic  discontinued by PCP due to nausea and abdominal 02/2023  SUBJECTIVE:   During the last visit (11/17/2023): A1c 10.5%     Today (03/21/2024): Kathleen Hatfield here for a follow up on diabetes management..  She checks her  glucose through dexcom G7.  She has no hypoglycemia.  She follows with cardiology for CHF and dyslipidemia She has chronic UTIs- sees urology on chronic Abx  Patient follows with pulmonary for COPD and OSA on CPAP   No nausea , no heartburn  Continues with changes in bowel movements    HOME DIABETES REGIMEN:  Mounjaro  7.5 mg weekly  Tresiba  100 units at bedtime  Novolog  16 units TIDQAC    Statin: on repatha  ACE-I/ARB: no   CONTINUOUS GLUCOSE MONITORING RECORD INTERPRETATION                        DIABETIC COMPLICATIONS: Microvascular complications:  Neuropathy, CKD III Denies:  Last Eye Exam: Completed 10/01/2022  Macrovascular complications:  CAD Denies:  CVA, PVD   HISTORY:  Past Medical History:  Past Medical History:  Diagnosis Date   Allergy  Childhood   almonds - anaphylactic shock   Anemia    Anxiety    Arthritis    Asthma    Chronic diastolic CHF 05/2014   Echocardiogram 05/2019: EF 70, no RWMA, mild LVH, Gr 1 DD, normal RVSF,  severe LVH, borderline asc Aorta (39 mm)   CKD (chronic kidney disease)    COPD (chronic obstructive pulmonary disease) (HCC) 2016   Depression    Diabetes mellitus without complication (HCC)    Diverticulitis    Dyspnea    with exertion   GERD (gastroesophageal reflux disease)    History of blood transfusion    Hyperlipidemia    cannot tolerate statins   Hypertension    patient states she has never had high blood pressure.    Myocardial infarction [facility]) adult   Nuclear stress test    Myoview  05/2019: EF 83, no ischemia or infarction; Low Risk   Oxygen  deficiency    Peripheral neuropathy    Pneumonia    PONV (postoperative nausea and vomiting)    RLS (restless legs syndrome)    Sleep apnea    uses CPAP   Ulcerative colitis (HCC)    dr kristie   Past Surgical History:  Past Surgical History:  Procedure Laterality Date   ABDOMINAL HYSTERECTOMY     APPENDECTOMY     BIOPSY  10/10/2021   Procedure: BIOPSY;  Surgeon: Rollin Dover, MD;  Location: [facility];  Service: Gastroenterology;;   BREAST BIOPSY Left    BREAST SURGERY     left biopsy   CARDIAC CATHETERIZATION     CESAREAN SECTION  CHOLECYSTECTOMY     COLON SURGERY  2010   Sigmoid removed   COLONOSCOPY WITH PROPOFOL  N/A 10/10/2021   Procedure: COLONOSCOPY WITH PROPOFOL ;  Surgeon: Rollin Dover, MD;  Location: [facility];  Service: Gastroenterology;  Laterality: N/A;   HERNIA REPAIR     INSERTION OF MESH N/A 11/15/2015   Procedure: INSERTION OF MESH;  Surgeon: Sheldon Standing, MD;  Location: [facility];  Service: General;  Laterality: N/A;   LAPAROSCOPIC LYSIS OF ADHESIONS N/A 11/15/2015   Procedure: LAPAROSCOPIC LYSIS OF ADHESIONS;  Surgeon: Sheldon Standing, MD;  Location: [facility];  Service: General;  Laterality: N/A;   RIGHT HEART CATHETERIZATION N/A 02/27/2013   Procedure: RIGHT HEART CATH;  Surgeon: Ozell JONETTA Fell, MD;  Location: [facility] CATH LAB;  Service: Cardiovascular;  Laterality: N/A;   RIGHT HEART CATHETERIZATION  N/A 12/14/2013   Procedure: RIGHT HEART CATH;  Surgeon: Ezra GORMAN Shuck, MD;  Location: [facility] CATH LAB;  Service: Cardiovascular;  Laterality: N/A;   SIGMOIDECTOMY  2010   diverticular disease   TUBAL LIGATION  1986   VENTRAL HERNIA REPAIR N/A 11/15/2015   Procedure: LAPAROSCOPIC VENTRAL WALL HERNIA REPAIR;  Surgeon: Sheldon Standing, MD;  Location: [facility];  Service: General;  Laterality: N/A;   Social History:  reports that she has never smoked. She has been exposed to tobacco smoke. She has never used smokeless tobacco. She reports that she does not drink alcohol and does not use drugs. Family History:  Family History  Problem Relation Age of Onset   Heart disease Mother    Hypertension Mother    Dementia Mother    Varicose Veins Mother    Heart disease Father    COPD Father    Hypertension Father    Hearing loss Father    Heart disease Brother    Other Brother        knee replacement; hip replacement   Other Brother        cant brathe when laying down   Diabetes Maternal Grandmother    Breast cancer Neg Hx    Colon cancer Neg Hx      HOME MEDICATIONS: Allergies as of 03/21/2024       Reactions   Almond Oil Anaphylaxis, Shortness Of Breath, Swelling   Morphine  And Codeine  Shortness Of Breath, Other (See Comments)   Pt. States while in the hospital it affected her breathing, O2 dropped to the 70's   Primidone  Shortness Of Breath       Atorvastatin Other (See Comments)   Leg weakness   Ceclor [cefaclor] Nausea And Vomiting   Latex Rash   Material used for CPAP strap caused severe skin irritation.  Not sure if she Hatfield allergic to all latex   Praluent  [alirocumab ]    Sulfa  Antibiotics Nausea And Vomiting   Ciprofloxacin  Other (See Comments)   Makes joints and muscles ache   Levaquin  [levofloxacin ] Other (See Comments)   Body aches   Losartan  Other (See Comments)   Weakness   Peanut-containing Drug Products    Pt states she thinks it Hatfield causing gout flare-ups   Repatha   [evolocumab ]    Aches   Statins Other (See Comments)   Leg and body weakness        Medication List        Accurate as of March 21, 2024  1:08 PM. If you have any questions, ask your nurse or doctor.          rOPINIRole  4 MG tablet Commonly known as:  REQUIP  TAKE 1 TABLET BY MOUTH EVERYDAY AT BEDTIME The timing of this medication Hatfield very important.   acetaminophen  500 MG tablet Commonly known as: TYLENOL  Take 1,000 mg by mouth every 8 (eight) hours as needed.   albuterol  108 (90 Base) MCG/ACT inhaler Commonly known as: VENTOLIN  HFA INHALE 2 PUFFS BY MOUTH TWICE A DAY TO 3 TIMES A DAY AS NEEDED FOR WHEEZE /SHORTNESS OF BREATH   amoxicillin -clavulanate 875-125 MG tablet Commonly known as: AUGMENTIN  Take 1 tablet by mouth 2 (two) times daily.   arformoterol  15 MCG/2ML Nebu Commonly known as: BROVANA  Substituted for: Brovana  Neb Solution Inhale one vial in nebulizer twice a day.   budesonide  0.5 MG/2ML nebulizer solution Commonly known as: PULMICORT  Generic for Pulmicort . Inhale one vial in nebulizer twice a day. Rinse mouth after use.   buPROPion  300 MG 24 hr tablet Commonly known as: WELLBUTRIN  XL Take 1 tablet (300 mg total) by mouth in the morning.   colchicine  0.6 MG tablet TAKE 1 TABLET (0.6 MG TOTAL) BY MOUTH DAILY AS NEEDED (FOR GOUT)   cyanocobalamin  1000 MCG tablet Commonly known as: VITAMIN B12 Take 1 tablet (1,000 mcg total) by mouth daily.   dicyclomine  20 MG tablet Commonly known as: BENTYL  Take 1 tablet (20 mg total) by mouth 4 (four) times daily as needed for GI cramping, pain, nausea/vomiting.   DULoxetine  60 MG capsule Commonly known as: CYMBALTA  Take 1 capsule (60 mg total) by mouth at bedtime.   fluticasone  50 MCG/ACT nasal spray Commonly known as: FLONASE  Place 2 sprays into both nostrils daily.   furosemide  20 MG tablet Commonly known as: LASIX  TAKE 3 TABLETS (60 MG TOTAL) BY MOUTH 2 (TWO) TIMES DAILY.   gabapentin  300 MG  capsule Commonly known as: NEURONTIN  TAKE 1 CAPSULE BY MOUTH TWICE A DAY   Global Ease Inject Pen Needles 32G X 4 MM Misc Generic drug: Insulin  Pen Needle Use for insulin  in the morning, at noon, in the evening, and at bedtime.   Incruse Ellipta  62.5 MCG/ACT Aepb Generic drug: umeclidinium bromide  Inhale 1 puff into the lungs daily.   mesalamine  1.2 g EC tablet Commonly known as: LIALDA  Take 2 tablets (2.4 g total) by mouth 2 (two) times daily.   metolazone  5 MG tablet Commonly known as: ZAROXOLYN  Take 1 tablet (5 mg total) by mouth daily as needed.   NovoLOG  FlexPen 100 UNIT/ML FlexPen Generic drug: insulin  aspart Inject 16 Units into the skin 2 (two) times daily with a meal.   ondansetron  4 MG tablet Commonly known as: ZOFRAN  Take 1 tablet (4 mg total) by mouth every 8 (eight) hours as needed for nausea and vomiting   optichamber diamond  Misc Use as dirtected with inhaler   OXYGEN  Inhale 2 L into the lungs continuous.   pantoprazole  40 MG tablet Commonly known as: PROTONIX  TAKE 1 TABLET BY MOUTH EVERY DAY IN THE MORNING   potassium chloride  10 MEQ CR capsule Commonly known as: MICRO-K  TAKE 2 CAPSULES ORALLY IN THE MORNING AND 1 CAPSULE DAILY AT 12 NOON AND 2 CAPSULES EVERY EVENING   spironolactone  25 MG tablet Commonly known as: ALDACTONE  TAKE 1/2 TABLET BY MOUTH EVERY DAY   tirzepatide  7.5 MG/0.5ML Pen Commonly known as: MOUNJARO  Inject 7.5 mg into the skin once a week.   traMADol  50 MG tablet Commonly known as: ULTRAM  Take 1 tablet (50 mg total) by mouth 3 (three) times daily as needed.   Tresiba  FlexTouch 200 UNIT/ML FlexTouch Pen Generic drug: insulin  degludec Inject 100  Units into the skin daily.   triamcinolone  cream 0.1 % Commonly known as: KENALOG  Apply 1 Application topically 2 (two) times daily as needed.   Vitamin D -3 125 MCG (5000 UT) Tabs Take 5,000 Units by mouth every other day.         OBJECTIVE:   Vital Signs: BP 128/80   Ht  5' 5 (1.651 m)   Wt 247 lb (112 kg)   BMI 41.10 kg/m   Wt Readings from Last 3 Encounters:  03/21/24 247 lb (112 kg)  01/25/24 245 lb (111.1 kg)  11/17/23 252 lb (114.3 kg)     Exam: General: Pt appears well and Hatfield in NAD In a wheel chair   Lungs: Clear with good BS bilat   Heart: RRR   Extremities: No pretibial edema.   Neuro: MS Hatfield good with appropriate affect, pt Hatfield alert and Ox3   DM Foot Exam 03/21/2024  The skin of the feet Hatfield intact without sores or ulcerations. The pedal pulses are 2+ on right and 2+ on left. The sensation Hatfield decreased to a screening 5.07, 10 gram monofilament bilaterally   DATA REVIEWED:  Lab Results  Component Value Date   HGBA1C 10.4 (A) 03/21/2024   HGBA1C 10.6 (A) 11/17/2023   HGBA1C 7.9 (A) 07/07/2023    Latest Reference Range & Units 12/06/23 15:54  Sodium 135 - 145 mEq/L 141  Potassium 3.5 - 5.1 mEq/L 4.1  Chloride 96 - 112 mEq/L 97  CO2 19 - 32 mEq/L 38 (H)  Glucose 70 - 99 mg/dL 773 (H)  BUN 6 - 23 mg/dL 21  Creatinine 9.59 - 8.79 mg/dL 8.82  Calcium  8.4 - 10.5 mg/dL 9.9  Magnesium  1.5 - 2.5 mg/dL 1.8  Alkaline Phosphatase 39 - 117 U/L 93  Albumin  3.5 - 5.2 g/dL 3.7  AST 0 - 37 U/L 33  ALT 0 - 35 U/L 34  Total Protein 6.0 - 8.3 g/dL 6.2  Total Bilirubin 0.2 - 1.2 mg/dL 0.4  GFR >39.99 mL/min 47.82 (L)     ASSESSMENT / PLAN / RECOMMENDATIONS:   1) Type 2 Diabetes Mellitus, poorly controlled, With CKD III, neuropathic  and macrovascular complications - Most recent A1c of 10.4 %. Goal A1c < 7.0 %.    - Patient continues with persistent hyperglycemia - She continues to forget to take NovoLog  on a regular basis, on average she takes it once daily in the morning - Tolerating Mounjaro  - We have discussed insulin  pump technology in the past, she Hatfield interested in trying OmniPod if this Hatfield covered by her insurance -She did not do well with a correction scale, so we discontinued it  - She Hatfield intolerant to Ozempic  - Not a  candidate for SGLT-2i due to chronic UTI's  - Limited glycemic agents due to CKD  - Referral to our CDE for OmniPod training was placed - Will increase insulin  as well as Mounjaro  as below - A prescription for OmniPod has been placed  MEDICATIONS: Increase Mounjaro  10 mg weekly Take tresiba  110 units daily  Take Novolog  20 units before breakfast and supper  EDUCATION / INSTRUCTIONS: BG monitoring instructions: Patient Hatfield instructed to check her blood sugars 3 times a day, before each meal . Call [facility] if: BG persistently < 70  I reviewed the Rule of 15 for the treatment of hypoglycemia in detail with the patient. Literature supplied.    2) Diabetic complications:  Eye: Does not have known diabetic retinopathy.  Neuro/ Feet:  Does have known diabetic peripheral neuropathy .  Renal: Patient does  have known baseline CKD. She   Hatfield not on an ACEI/ARB at present. She had endorses weakness with losartan        F/U in 3 months    Signed electronically by: Stefano Redgie Butts, MD  [facility]  [facility] [address] [place], [place] [address] Phone: [phone] FAX: [phone]   CC: Cleatus Arlyss RAMAN, MD [address] [place] [address] Phone: [phone]  Fax: [phone]  Return to Endocrinology clinic as below: Future Appointments  Date Time Provider Department Center  03/22/2024  3:00 PM Dina Camie BRAVO, PA-C [facility] None  05/17/2024  3:15 PM Neda Jennet LABOR, MD [facility] [address]  07/17/2024 10:50 AM [facility] ANNUAL WELLNESS VISIT 1 [facility] [address]      "

## 2024-03-21 NOTE — Patient Instructions (Addendum)
 Increase  Mounjaro  10 mg weekly  Take Tresiba  110 units DAILY  Novolog  20 units with meals   HOW TO TREAT LOW BLOOD SUGARS (Blood sugar LESS THAN 70 MG/DL) Please follow the RULE OF 15 for the treatment of hypoglycemia treatment (when your (blood sugars are less than 70 mg/dL)   STEP 1: Take 15 grams of carbohydrates when your blood sugar is low, which includes:  3-4 GLUCOSE TABS  OR 3-4 OZ OF JUICE OR REGULAR SODA OR ONE TUBE OF GLUCOSE GEL    STEP 2: RECHECK blood sugar in 15 MINUTES STEP 3: If your blood sugar is still low at the 15 minute recheck --> then, go back to STEP 1 and treat AGAIN with another 15 grams of carbohydrates.

## 2024-03-21 NOTE — Telephone Encounter (Signed)
 Order for omnipod sent to [facility] with omnipod to process.

## 2024-03-21 NOTE — Progress Notes (Signed)
 "   Assessment/Plan:   Tremors of unclear etiology  Patient was last seen on 07/13/2023.     Symptoms are***.  She denies dropping objects, she may have difficulty picking them up from the floor attributed to body habitus   On exam, tremors are improved, not seen at the wrists or with distraction, only on in tension.  His tremors are intermittent.  There might be a component of if in the affecting her movements.  She continues to use a walker to ambulate due to RLS.  She reports that her RLS is also better controlled with gabapentin  in 300 mg twice daily and ropinirole  4 mg nightly.  She has not done PT or OT (stating that this is due to high glucose which will interfere with PT)   Mood is improved with Wellbutrin  300 mg every 24 hours and Cymbalta  60 mg nightly.  She states that she does not need psychotherapy.          Recommendations:   Discussed the importance of taking medications as directed The patient does not wish to take primidone  because she is concerned about breathing issues with this medication She takes ropinirole  4 mg nightly and gabapentin  300 mg twice daily, does not wish to make any adjustments  Commend PT for strength and balance and OT for tremor once glucose is at therapeutic range Recommend psychotherapy for depression Once again she was educated about COPD medications and tremors Search [employer] at the  the physical-occupational therapy aids such as handy things gloves Recommend heavy spoons- forks as prior mentioned to her Follow-up in 6 months    Subjective:    Kathleen Hatfield was seen today in follow up for tremors of unclear etiology.  She is here alone.  Tremors are improving as she may have some adjustments in her medications, including her chronic UTI meds.  She may occasionally have rest tremors unless distracted.  Intention tremors are improved.  She does not drop objects, but has difficulty picking them in view of her body habitus.  She denies any drooling  or trouble swallowing.  She uses a walker to ambulate or to lean against it especially since she has RLS.  She reports needing a walker or a wheelchair to ambulate if she feels unstable on her feet.  Mood disorder is controlled although she has not been seeking psychotherapy.        ALLERGIES:   Allergies  Allergen Reactions   Almond Oil Anaphylaxis, Shortness Of Breath and Swelling   Morphine  And Codeine  Shortness Of Breath and Other (See Comments)    Pt. States while in the hospital it affected her breathing, O2 dropped to the 70's   Primidone  Shortness Of Breath        Atorvastatin Other (See Comments)    Leg weakness   Ceclor [Cefaclor] Nausea And Vomiting   Latex Rash    Material used for CPAP strap caused severe skin irritation.   Not sure if she is allergic to all latex   Praluent  [Alirocumab ]    Sulfa  Antibiotics Nausea And Vomiting   Ciprofloxacin  Other (See Comments)    Makes joints and muscles ache   Levaquin  [Levofloxacin ] Other (See Comments)    Body aches   Losartan  Other (See Comments)    Weakness    Peanut-Containing Drug Products     Pt states she thinks it is causing gout flare-ups   Repatha  [Evolocumab ]     Aches   Statins Other (See Comments)  Leg and body weakness    CURRENT MEDICATIONS:  Outpatient Encounter Medications as of 03/22/2024  Medication Sig   acetaminophen  (TYLENOL ) 500 MG tablet Take 1,000 mg by mouth every 8 (eight) hours as needed.   albuterol  (VENTOLIN  HFA) 108 (90 Base) MCG/ACT inhaler INHALE 2 PUFFS BY MOUTH TWICE A DAY TO 3 TIMES A DAY AS NEEDED FOR WHEEZE /SHORTNESS OF BREATH   amoxicillin -clavulanate (AUGMENTIN ) 875-125 MG tablet Take 1 tablet by mouth 2 (two) times daily.   arformoterol  (BROVANA ) 15 MCG/2ML NEBU Substituted for: Brovana  Neb Solution Inhale one vial in nebulizer twice a day.   budesonide  (PULMICORT ) 0.5 MG/2ML nebulizer solution Generic for Pulmicort . Inhale one vial in nebulizer twice a day. Rinse mouth after  use.   buPROPion  (WELLBUTRIN  XL) 300 MG 24 hr tablet Take 1 tablet (300 mg total) by mouth in the morning.   Cholecalciferol  (VITAMIN D -3) 5000 UNITS TABS Take 5,000 Units by mouth every other day.    colchicine  0.6 MG tablet TAKE 1 TABLET (0.6 MG TOTAL) BY MOUTH DAILY AS NEEDED (FOR GOUT)   cyanocobalamin  (VITAMIN B12) 1000 MCG tablet Take 1 tablet (1,000 mcg total) by mouth daily.   dicyclomine  (BENTYL ) 20 MG tablet Take 1 tablet (20 mg total) by mouth 4 (four) times daily as needed for GI cramping, pain, nausea/vomiting.   DULoxetine  (CYMBALTA ) 60 MG capsule Take 1 capsule (60 mg total) by mouth at bedtime.   fluticasone  (FLONASE ) 50 MCG/ACT nasal spray Place 2 sprays into both nostrils daily.   furosemide  (LASIX ) 20 MG tablet TAKE 3 TABLETS (60 MG TOTAL) BY MOUTH 2 (TWO) TIMES DAILY.   gabapentin  (NEURONTIN ) 300 MG capsule TAKE 1 CAPSULE BY MOUTH TWICE A DAY   insulin  aspart (NOVOLOG  FLEXPEN) 100 UNIT/ML FlexPen Inject 16 Units into the skin 2 (two) times daily with a meal.   insulin  degludec (TRESIBA  FLEXTOUCH) 200 UNIT/ML FlexTouch Pen Inject 100 Units into the skin daily.   Insulin  Pen Needle (GLOBAL EASE INJECT PEN NEEDLES) 32G X 4 MM MISC Use for insulin  in the morning, at noon, in the evening, and at bedtime.   mesalamine  (LIALDA ) 1.2 g EC tablet Take 2 tablets (2.4 g total) by mouth 2 (two) times daily.   metolazone  (ZAROXOLYN ) 5 MG tablet Take 1 tablet (5 mg total) by mouth daily as needed.   ondansetron  (ZOFRAN ) 4 MG tablet Take 1 tablet (4 mg total) by mouth every 8 (eight) hours as needed for nausea and vomiting   OXYGEN  Inhale 2 L into the lungs continuous.   pantoprazole  (PROTONIX ) 40 MG tablet TAKE 1 TABLET BY MOUTH EVERY DAY IN THE MORNING   potassium chloride  (MICRO-K ) 10 MEQ CR capsule TAKE 2 CAPSULES ORALLY IN THE MORNING AND 1 CAPSULE DAILY AT 12 NOON AND 2 CAPSULES EVERY EVENING   rOPINIRole  (REQUIP ) 4 MG tablet TAKE 1 TABLET BY MOUTH EVERYDAY AT BEDTIME    Spacer/Aero-Holding Chambers (OPTICHAMBER DIAMOND ) MISC Use as dirtected with inhaler   spironolactone  (ALDACTONE ) 25 MG tablet TAKE 1/2 TABLET BY MOUTH EVERY DAY   tirzepatide  (MOUNJARO ) 7.5 MG/0.5ML Pen Inject 7.5 mg into the skin once a week.   traMADol  (ULTRAM ) 50 MG tablet Take 1 tablet (50 mg total) by mouth 3 (three) times daily as needed.   triamcinolone  cream (KENALOG ) 0.1 % Apply 1 Application topically 2 (two) times daily as needed.   umeclidinium bromide  (INCRUSE ELLIPTA ) 62.5 MCG/ACT AEPB Inhale 1 puff into the lungs daily.   No facility-administered encounter medications on file as  of 03/22/2024.    Objective:   PHYSICAL EXAMINATION:    VITALS:   There were no vitals filed for this visit.    GEN:  The patient appears stated age and is in NAD. HEENT:  Normocephalic, atraumatic.  The mucous membranes are moist. The superficial temporal arteries are without ropiness or tenderness.  Neurological examination:  Orientation: The patient is alert and oriented x3. Cranial nerves: There is good facial symmetry with very mild facial hypomimia. The speech is fluent and clear. Soft palate rises symmetrically and there is no tongue deviation. Hearing is intact to conversational tone. Sensation: Sensation is intact to light touch throughout Motor: Strength is at least antigravity x4.  Movement examination: Tone: There is normal tone in the upper and lower extremities.  No cogwheeling. Abnormal movements: No postural asterixis, very mild B  rest tremor when distracted.  Tremors when holding heavy objects.  No leg or facial tremors Coordination:  There is no  decremation with RAM's or FNF,   Gait and Station: The patient has significant difficulty arising out of a wheel chair without the use of the hands, needs assistance. The patient's stride length is short.  Gait is wide the base and has difficulty turning      Total time spent on today's visit was 26 minutes, including both  face-to-face time and nonface-to-face time.  Time included that spent on review of records (prior notes available to me/labs/imaging if pertinent), discussing treatment and goals, answering patient's questions and coordinating care.  Cc:  Cleatus Arlyss RAMAN, MD  "

## 2024-03-22 ENCOUNTER — Encounter: Attending: Internal Medicine | Admitting: Nutrition

## 2024-03-22 ENCOUNTER — Ambulatory Visit: Payer: Self-pay | Admitting: Physician Assistant

## 2024-03-22 ENCOUNTER — Encounter: Payer: Self-pay | Admitting: Physician Assistant

## 2024-03-22 VITALS — BP 127/79 | HR 87 | Resp 20 | Ht 65.0 in

## 2024-03-22 DIAGNOSIS — R251 Tremor, unspecified: Secondary | ICD-10-CM

## 2024-03-22 DIAGNOSIS — E1165 Type 2 diabetes mellitus with hyperglycemia: Secondary | ICD-10-CM | POA: Insufficient documentation

## 2024-03-22 NOTE — Patient Instructions (Signed)
" °  Continue the medicines as prescribed   "

## 2024-03-23 ENCOUNTER — Other Ambulatory Visit: Payer: Self-pay | Admitting: Internal Medicine

## 2024-03-23 MED ORDER — INSULIN ASPART 100 UNIT/ML IJ SOLN: INTRAMUSCULAR | 3 refills | Status: AC

## 2024-03-23 NOTE — Progress Notes (Signed)
 Patient is here today to learn more about insulin  pumps and see if this will help her.  She admits to sometimes forgetting to bolus for meals as well as snacking during the day without any insulin .  She also admits to eating sweets and higher fat snacks.  She is taking 150 units of insulin  per day and so is needing a 3ml cartridge.  She was shown the 3 pumps that have have this capacity, and was shown how they work as well as how they deliver insulin .  She chose the Beta Bionic pump because of it's ease of use and its smaller size.  We discussed how this pump will need to be ordered and the training involved for this.  She was told to call me when it comes in to schedule a training date.  She had no final questions.

## 2024-03-23 NOTE — Patient Instructions (Signed)
 Call me when you pump comes in  [phone].

## 2024-03-23 NOTE — Progress Notes (Signed)
 Order placed

## 2024-03-29 ENCOUNTER — Telehealth: Payer: Self-pay

## 2024-03-29 DIAGNOSIS — E1165 Type 2 diabetes mellitus with hyperglycemia: Secondary | ICD-10-CM

## 2024-03-29 NOTE — Telephone Encounter (Signed)
 Spoke with patient and she would like to move forward with getting labs in order go get the Beta Bionic inulin pump.  Ordere placed and appointment scheduled.

## 2024-03-30 ENCOUNTER — Other Ambulatory Visit

## 2024-03-31 ENCOUNTER — Ambulatory Visit: Payer: Self-pay | Admitting: Internal Medicine

## 2024-03-31 LAB — COMPREHENSIVE METABOLIC PANEL WITH GFR
AG Ratio: 1.5 (calc) (ref 1.0–2.5)
ALT: 20 U/L (ref 6–29)
AST: 17 U/L (ref 10–35)
Albumin: 4.2 g/dL (ref 3.6–5.1)
Alkaline phosphatase (APISO): 99 U/L (ref 37–153)
BUN/Creatinine Ratio: 18 (calc) (ref 6–22)
BUN: 23 mg/dL (ref 7–25)
CO2: 35 mmol/L — ABNORMAL HIGH (ref 20–32)
Calcium: 10.5 mg/dL — ABNORMAL HIGH (ref 8.6–10.4)
Chloride: 96 mmol/L — ABNORMAL LOW (ref 98–110)
Creat: 1.28 mg/dL — ABNORMAL HIGH (ref 0.50–1.05)
Globulin: 2.8 g/dL (ref 1.9–3.7)
Glucose, Bld: 190 mg/dL — ABNORMAL HIGH (ref 65–99)
Potassium: 4 mmol/L (ref 3.5–5.3)
Sodium: 141 mmol/L (ref 135–146)
Total Bilirubin: 0.5 mg/dL (ref 0.2–1.2)
Total Protein: 7 g/dL (ref 6.1–8.1)
eGFR: 45 mL/min/1.73m2 — ABNORMAL LOW

## 2024-03-31 LAB — C-PEPTIDE: C-Peptide: 5.36 ng/mL — ABNORMAL HIGH (ref 0.80–3.85)

## 2024-04-05 ENCOUNTER — Telehealth: Payer: Self-pay

## 2024-04-05 NOTE — Telephone Encounter (Signed)
 The fasting blood glucose does meet the qualification criteria. However, the C-peptide does not. Do you happen to have any additional lab results that might help us  meet the Medicare requirements for iLet approval?  Results for the following laboratory studies:  -ICA (Islet Cell Autoantibodies) and /or Glutamic Acid Decarboxylase (GAD65) - CPT Code 86341**Can be used by Medicare**  - IAA (Insulin  Autoantibody) - CPT Code 13662 **Can be used by Medicare** Zinc Transporter 8 Autoantibodies - CPT 13658 **Can be used by Medicare**  How should we proceed?

## 2024-05-17 ENCOUNTER — Ambulatory Visit: Admitting: Pulmonary Disease

## 2024-06-20 ENCOUNTER — Ambulatory Visit: Admitting: Internal Medicine

## 2024-07-17 ENCOUNTER — Ambulatory Visit

## 2024-09-21 ENCOUNTER — Ambulatory Visit: Payer: Self-pay | Admitting: Physician Assistant
